# Patient Record
Sex: Male | Born: 1960 | Race: White | Hispanic: No | Marital: Married | Smoking: Current every day smoker
Health system: Southern US, Community
[De-identification: ages and names within clinical notes are randomized; demographics above are authoritative.]

## PROBLEM LIST (undated history)

## (undated) ENCOUNTER — Emergency Department (HOSPITAL_COMMUNITY): Payer: Medicaid Other | Source: Home / Self Care

## (undated) DIAGNOSIS — J449 Chronic obstructive pulmonary disease, unspecified: Secondary | ICD-10-CM

## (undated) DIAGNOSIS — E785 Hyperlipidemia, unspecified: Secondary | ICD-10-CM

## (undated) DIAGNOSIS — K219 Gastro-esophageal reflux disease without esophagitis: Secondary | ICD-10-CM

## (undated) DIAGNOSIS — R519 Headache, unspecified: Secondary | ICD-10-CM

## (undated) DIAGNOSIS — L03115 Cellulitis of right lower limb: Secondary | ICD-10-CM

## (undated) DIAGNOSIS — K759 Inflammatory liver disease, unspecified: Secondary | ICD-10-CM

## (undated) DIAGNOSIS — Z8719 Personal history of other diseases of the digestive system: Secondary | ICD-10-CM

## (undated) DIAGNOSIS — E119 Type 2 diabetes mellitus without complications: Secondary | ICD-10-CM

## (undated) DIAGNOSIS — F329 Major depressive disorder, single episode, unspecified: Secondary | ICD-10-CM

## (undated) DIAGNOSIS — Z201 Contact with and (suspected) exposure to tuberculosis: Secondary | ICD-10-CM

## (undated) DIAGNOSIS — L02415 Cutaneous abscess of right lower limb: Secondary | ICD-10-CM

## (undated) DIAGNOSIS — I509 Heart failure, unspecified: Secondary | ICD-10-CM

## (undated) DIAGNOSIS — G4730 Sleep apnea, unspecified: Secondary | ICD-10-CM

## (undated) DIAGNOSIS — W19XXXA Unspecified fall, initial encounter: Secondary | ICD-10-CM

## (undated) DIAGNOSIS — I6529 Occlusion and stenosis of unspecified carotid artery: Secondary | ICD-10-CM

## (undated) DIAGNOSIS — I639 Cerebral infarction, unspecified: Secondary | ICD-10-CM

## (undated) DIAGNOSIS — M069 Rheumatoid arthritis, unspecified: Secondary | ICD-10-CM

## (undated) DIAGNOSIS — W009XXA Unspecified fall due to ice and snow, initial encounter: Secondary | ICD-10-CM

## (undated) DIAGNOSIS — Z8614 Personal history of Methicillin resistant Staphylococcus aureus infection: Secondary | ICD-10-CM

## (undated) DIAGNOSIS — S129XXA Fracture of neck, unspecified, initial encounter: Secondary | ICD-10-CM

## (undated) DIAGNOSIS — B1920 Unspecified viral hepatitis C without hepatic coma: Secondary | ICD-10-CM

## (undated) DIAGNOSIS — I739 Peripheral vascular disease, unspecified: Secondary | ICD-10-CM

## (undated) DIAGNOSIS — I2699 Other pulmonary embolism without acute cor pulmonale: Secondary | ICD-10-CM

## (undated) DIAGNOSIS — R51 Headache: Secondary | ICD-10-CM

## (undated) DIAGNOSIS — R569 Unspecified convulsions: Secondary | ICD-10-CM

## (undated) DIAGNOSIS — G894 Chronic pain syndrome: Secondary | ICD-10-CM

## (undated) DIAGNOSIS — J45909 Unspecified asthma, uncomplicated: Secondary | ICD-10-CM

## (undated) DIAGNOSIS — N529 Male erectile dysfunction, unspecified: Secondary | ICD-10-CM

## (undated) DIAGNOSIS — J189 Pneumonia, unspecified organism: Secondary | ICD-10-CM

## (undated) DIAGNOSIS — Z87442 Personal history of urinary calculi: Secondary | ICD-10-CM

## (undated) DIAGNOSIS — F32A Depression, unspecified: Secondary | ICD-10-CM

## (undated) DIAGNOSIS — I10 Essential (primary) hypertension: Secondary | ICD-10-CM

## (undated) DIAGNOSIS — F410 Panic disorder [episodic paroxysmal anxiety] without agoraphobia: Secondary | ICD-10-CM

## (undated) HISTORY — DX: Male erectile dysfunction, unspecified: N52.9

## (undated) HISTORY — PX: CARDIAC DEFIBRILLATOR PLACEMENT: SHX171

## (undated) HISTORY — PX: MULTIPLE TOOTH EXTRACTIONS: SHX2053

## (undated) HISTORY — DX: Occlusion and stenosis of unspecified carotid artery: I65.29

## (undated) HISTORY — PX: CERVICAL FUSION: SHX112

## (undated) HISTORY — PX: TONSILLECTOMY: SUR1361

## (undated) HISTORY — PX: FEMORAL-POPLITEAL BYPASS GRAFT: SHX937

## (undated) HISTORY — DX: Cerebral infarction, unspecified: I63.9

## (undated) HISTORY — DX: Peripheral vascular disease, unspecified: I73.9

## (undated) HISTORY — DX: Type 2 diabetes mellitus without complications: E11.9

## (undated) HISTORY — DX: Personal history of Methicillin resistant Staphylococcus aureus infection: Z86.14

## (undated) HISTORY — PX: ORIF TIBIA & FIBULA FRACTURES: SHX2131

## (undated) HISTORY — PX: ABOVE KNEE LEG AMPUTATION: SUR20

## (undated) HISTORY — PX: ABSCESS DRAINAGE: SHX1119

## (undated) HISTORY — DX: Essential (primary) hypertension: I10

## (undated) HISTORY — PX: THROMBOLYSIS: SHX2508

## (undated) HISTORY — DX: Unspecified fall, initial encounter: W19.XXXA

## (undated) HISTORY — DX: Unspecified fall due to ice and snow, initial encounter: W00.9XXA

## (undated) HISTORY — DX: Chronic obstructive pulmonary disease, unspecified: J44.9

## (undated) HISTORY — PX: INTRAOPERATIVE ARTERIOGRAM: SHX5157

## (undated) HISTORY — DX: Hyperlipidemia, unspecified: E78.5

## (undated) HISTORY — DX: Fracture of neck, unspecified, initial encounter: S12.9XXA

## (undated) HISTORY — DX: Chronic pain syndrome: G89.4

## (undated) HISTORY — PX: TRACHEOSTOMY: SUR1362

## (undated) HISTORY — DX: Unspecified viral hepatitis C without hepatic coma: B19.20

## (undated) HISTORY — DX: Heart failure, unspecified: I50.9

## (undated) HISTORY — PX: JOINT REPLACEMENT: SHX530

---

## 1997-05-30 ENCOUNTER — Emergency Department (HOSPITAL_COMMUNITY): Admission: EM | Admit: 1997-05-30 | Discharge: 1997-05-30 | Payer: Self-pay | Admitting: Emergency Medicine

## 1997-06-04 ENCOUNTER — Encounter: Admission: RE | Admit: 1997-06-04 | Discharge: 1997-06-04 | Payer: Self-pay | Admitting: Internal Medicine

## 1997-06-06 ENCOUNTER — Ambulatory Visit (HOSPITAL_COMMUNITY): Admission: RE | Admit: 1997-06-06 | Discharge: 1997-06-06 | Payer: Self-pay | Admitting: *Deleted

## 1997-06-18 ENCOUNTER — Encounter: Admission: RE | Admit: 1997-06-18 | Discharge: 1997-06-18 | Payer: Self-pay | Admitting: Internal Medicine

## 1997-06-27 ENCOUNTER — Ambulatory Visit (HOSPITAL_COMMUNITY): Admission: RE | Admit: 1997-06-27 | Discharge: 1997-06-27 | Payer: Self-pay | Admitting: *Deleted

## 1997-06-29 ENCOUNTER — Ambulatory Visit: Admission: RE | Admit: 1997-06-29 | Discharge: 1997-06-29 | Payer: Self-pay | Admitting: Vascular Surgery

## 1997-07-03 ENCOUNTER — Inpatient Hospital Stay: Admission: RE | Admit: 1997-07-03 | Discharge: 1997-07-05 | Payer: Self-pay | Admitting: Vascular Surgery

## 1997-08-03 ENCOUNTER — Encounter: Admission: RE | Admit: 1997-08-03 | Discharge: 1997-08-03 | Payer: Self-pay | Admitting: Internal Medicine

## 1998-01-10 ENCOUNTER — Encounter: Payer: Self-pay | Admitting: Emergency Medicine

## 1998-01-10 ENCOUNTER — Emergency Department (HOSPITAL_COMMUNITY): Admission: EM | Admit: 1998-01-10 | Discharge: 1998-01-10 | Payer: Self-pay | Admitting: Emergency Medicine

## 1998-04-04 ENCOUNTER — Emergency Department (HOSPITAL_COMMUNITY): Admission: EM | Admit: 1998-04-04 | Discharge: 1998-04-04 | Payer: Self-pay | Admitting: Emergency Medicine

## 1998-10-09 ENCOUNTER — Encounter: Admission: RE | Admit: 1998-10-09 | Discharge: 1998-10-09 | Payer: Self-pay | Admitting: Internal Medicine

## 1999-01-19 ENCOUNTER — Emergency Department (HOSPITAL_COMMUNITY): Admission: EM | Admit: 1999-01-19 | Discharge: 1999-01-19 | Payer: Self-pay | Admitting: *Deleted

## 1999-01-19 ENCOUNTER — Encounter: Payer: Self-pay | Admitting: *Deleted

## 1999-03-11 ENCOUNTER — Encounter: Admission: RE | Admit: 1999-03-11 | Discharge: 1999-03-11 | Payer: Self-pay | Admitting: Hematology and Oncology

## 1999-07-11 ENCOUNTER — Emergency Department (HOSPITAL_COMMUNITY): Admission: EM | Admit: 1999-07-11 | Discharge: 1999-07-11 | Payer: Self-pay | Admitting: Emergency Medicine

## 2000-10-01 ENCOUNTER — Encounter: Admission: RE | Admit: 2000-10-01 | Discharge: 2000-10-01 | Payer: Self-pay | Admitting: Internal Medicine

## 2000-10-07 ENCOUNTER — Encounter: Admission: RE | Admit: 2000-10-07 | Discharge: 2000-10-07 | Payer: Self-pay | Admitting: Internal Medicine

## 2001-07-18 ENCOUNTER — Emergency Department (HOSPITAL_COMMUNITY): Admission: EM | Admit: 2001-07-18 | Discharge: 2001-07-18 | Payer: Self-pay | Admitting: Emergency Medicine

## 2001-07-18 ENCOUNTER — Encounter: Payer: Self-pay | Admitting: Emergency Medicine

## 2002-02-08 ENCOUNTER — Encounter: Admission: RE | Admit: 2002-02-08 | Discharge: 2002-02-08 | Payer: Self-pay | Admitting: Internal Medicine

## 2002-03-28 ENCOUNTER — Encounter: Admission: RE | Admit: 2002-03-28 | Discharge: 2002-03-28 | Payer: Self-pay | Admitting: Internal Medicine

## 2002-04-11 ENCOUNTER — Encounter: Admission: RE | Admit: 2002-04-11 | Discharge: 2002-04-11 | Payer: Self-pay | Admitting: Internal Medicine

## 2002-05-24 ENCOUNTER — Encounter: Admission: RE | Admit: 2002-05-24 | Discharge: 2002-05-24 | Payer: Self-pay | Admitting: Internal Medicine

## 2002-05-26 ENCOUNTER — Encounter: Payer: Self-pay | Admitting: Internal Medicine

## 2002-05-26 ENCOUNTER — Encounter: Admission: RE | Admit: 2002-05-26 | Discharge: 2002-05-26 | Payer: Self-pay | Admitting: Internal Medicine

## 2002-05-26 ENCOUNTER — Ambulatory Visit (HOSPITAL_COMMUNITY): Admission: RE | Admit: 2002-05-26 | Discharge: 2002-05-26 | Payer: Self-pay | Admitting: Internal Medicine

## 2002-07-08 ENCOUNTER — Emergency Department (HOSPITAL_COMMUNITY): Admission: EM | Admit: 2002-07-08 | Discharge: 2002-07-08 | Payer: Self-pay | Admitting: Emergency Medicine

## 2002-11-09 ENCOUNTER — Encounter: Admission: RE | Admit: 2002-11-09 | Discharge: 2002-11-09 | Payer: Self-pay | Admitting: Internal Medicine

## 2002-12-02 ENCOUNTER — Emergency Department (HOSPITAL_COMMUNITY): Admission: EM | Admit: 2002-12-02 | Discharge: 2002-12-03 | Payer: Self-pay | Admitting: Emergency Medicine

## 2003-01-13 ENCOUNTER — Emergency Department (HOSPITAL_COMMUNITY): Admission: EM | Admit: 2003-01-13 | Discharge: 2003-01-14 | Payer: Self-pay | Admitting: Emergency Medicine

## 2003-09-20 ENCOUNTER — Ambulatory Visit: Payer: Self-pay | Admitting: Internal Medicine

## 2003-09-20 ENCOUNTER — Ambulatory Visit (HOSPITAL_COMMUNITY): Admission: RE | Admit: 2003-09-20 | Discharge: 2003-09-20 | Payer: Self-pay | Admitting: Internal Medicine

## 2003-09-25 ENCOUNTER — Ambulatory Visit: Payer: Self-pay | Admitting: Internal Medicine

## 2003-10-02 ENCOUNTER — Ambulatory Visit (HOSPITAL_COMMUNITY): Admission: RE | Admit: 2003-10-02 | Discharge: 2003-10-02 | Payer: Self-pay | Admitting: Internal Medicine

## 2003-10-05 ENCOUNTER — Ambulatory Visit: Payer: Self-pay | Admitting: Internal Medicine

## 2003-12-25 ENCOUNTER — Ambulatory Visit: Payer: Self-pay | Admitting: Internal Medicine

## 2004-02-13 ENCOUNTER — Emergency Department (HOSPITAL_COMMUNITY): Admission: EM | Admit: 2004-02-13 | Discharge: 2004-02-13 | Payer: Self-pay | Admitting: Family Medicine

## 2004-03-25 ENCOUNTER — Ambulatory Visit: Payer: Self-pay | Admitting: Internal Medicine

## 2004-04-03 ENCOUNTER — Ambulatory Visit: Payer: Self-pay | Admitting: Internal Medicine

## 2004-05-13 ENCOUNTER — Ambulatory Visit: Payer: Self-pay | Admitting: Internal Medicine

## 2004-05-13 ENCOUNTER — Ambulatory Visit (HOSPITAL_COMMUNITY): Admission: RE | Admit: 2004-05-13 | Discharge: 2004-05-13 | Payer: Self-pay | Admitting: Internal Medicine

## 2004-05-18 ENCOUNTER — Emergency Department (HOSPITAL_COMMUNITY): Admission: EM | Admit: 2004-05-18 | Discharge: 2004-05-18 | Payer: Self-pay | Admitting: Emergency Medicine

## 2004-05-22 ENCOUNTER — Ambulatory Visit: Payer: Self-pay | Admitting: Internal Medicine

## 2004-06-05 ENCOUNTER — Inpatient Hospital Stay (HOSPITAL_COMMUNITY): Admission: EM | Admit: 2004-06-05 | Discharge: 2004-07-01 | Payer: Self-pay
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
## 2006-01-13 IMAGING — CR DG CHEST 2V
3 series · 3 of 3 positions shown · non-contrast
Comparison: none

CLINICAL DATA: Cough.  Shortness of breath.  The patient was told 6 months ago that he has a spot on his lung.
 TWO-VIEW CHEST RADIOGRAPH, 09/20/03 
 Comparing to report from a prior chest radiograph dated 07/18/01.

[view not recorded (1 of 3)]
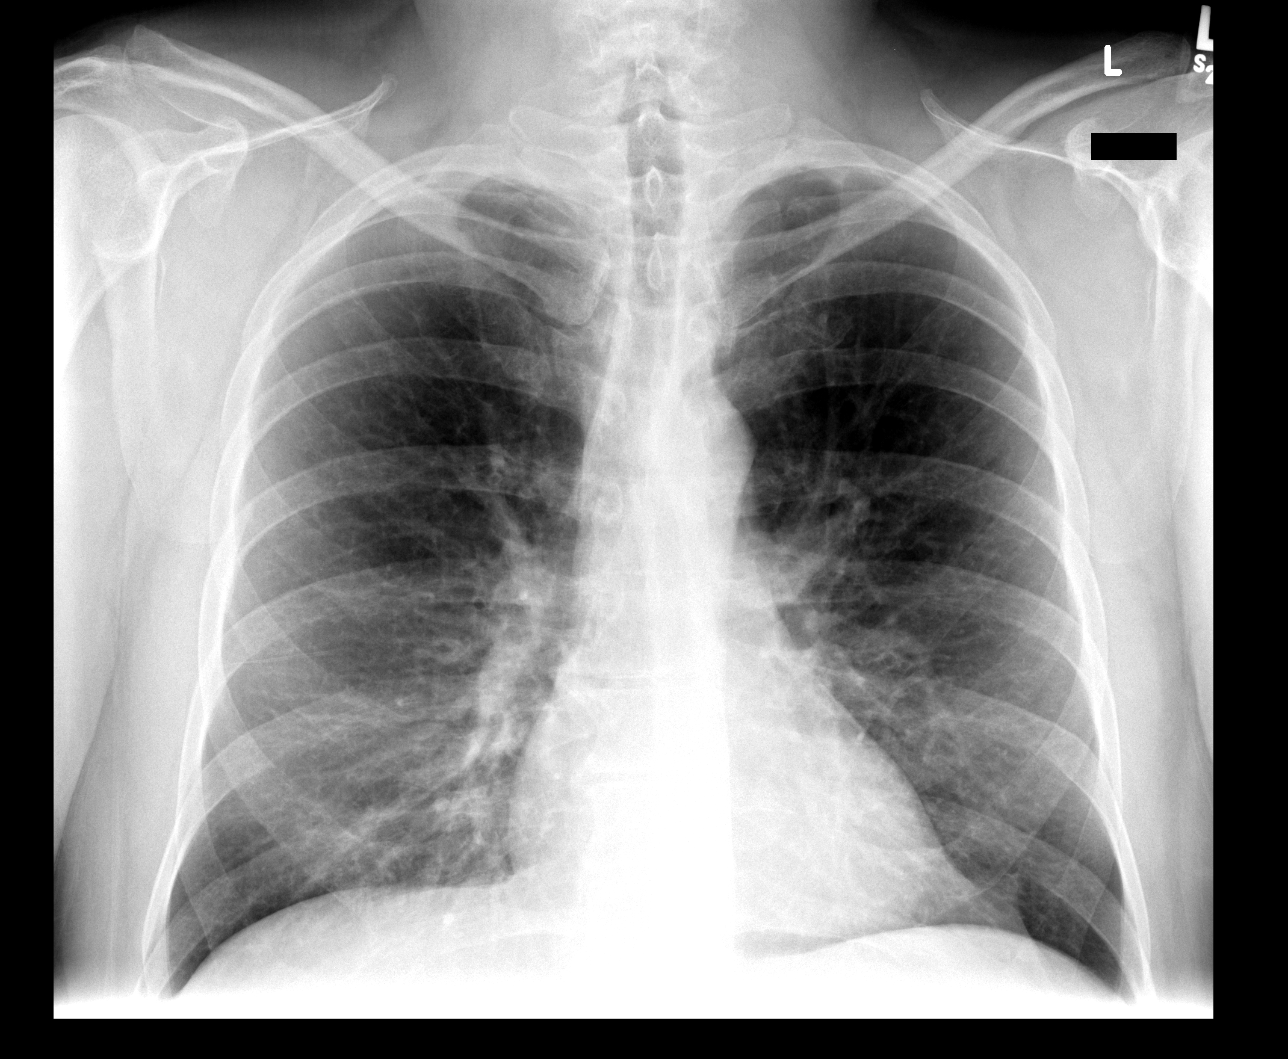

[view not recorded (2 of 3)]
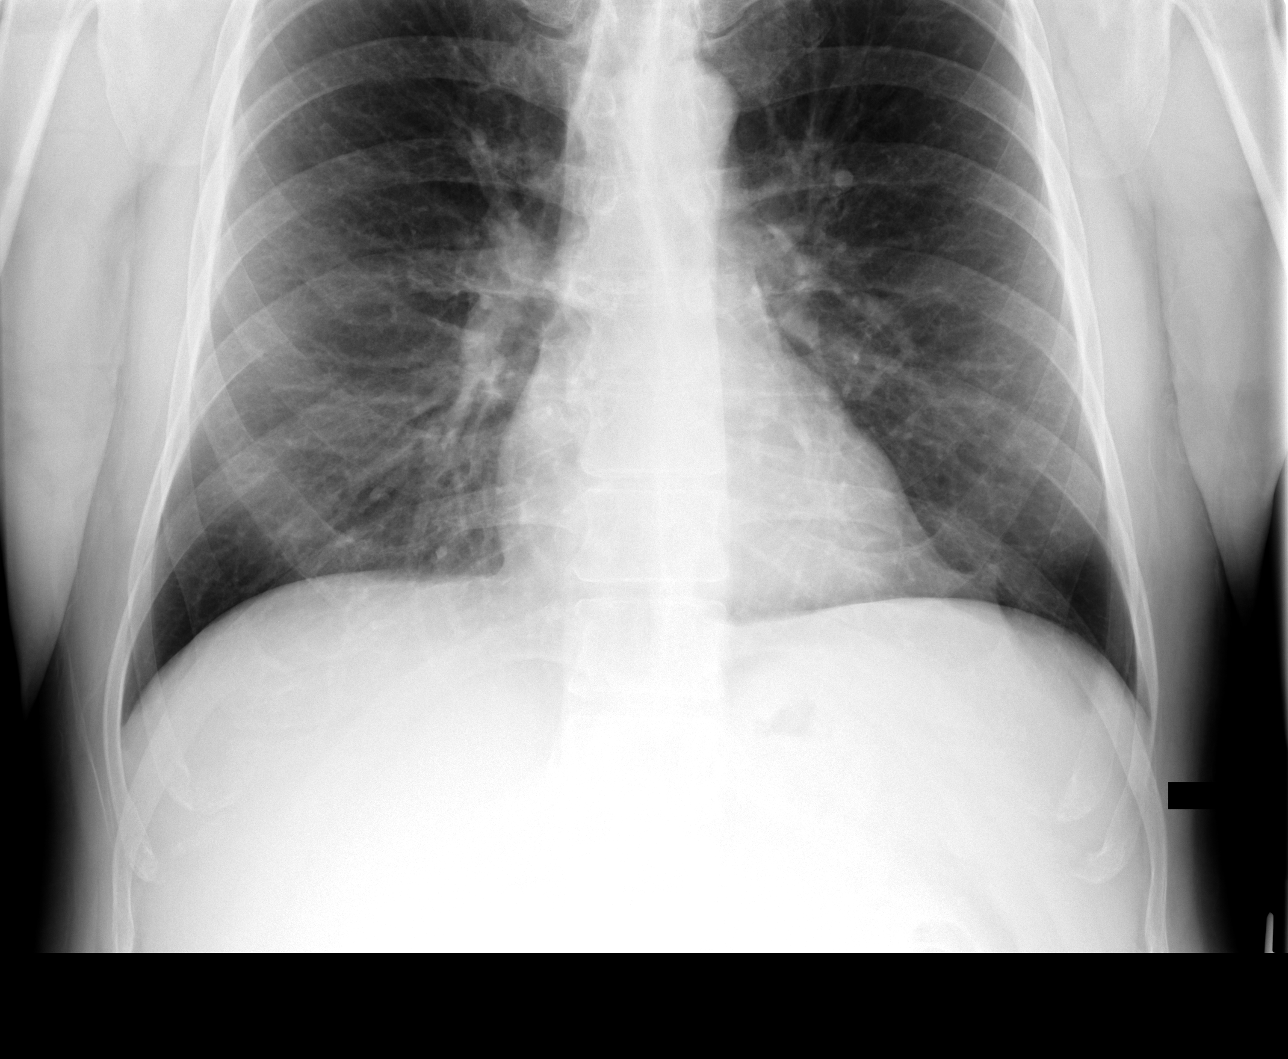

[view not recorded (3 of 3)]
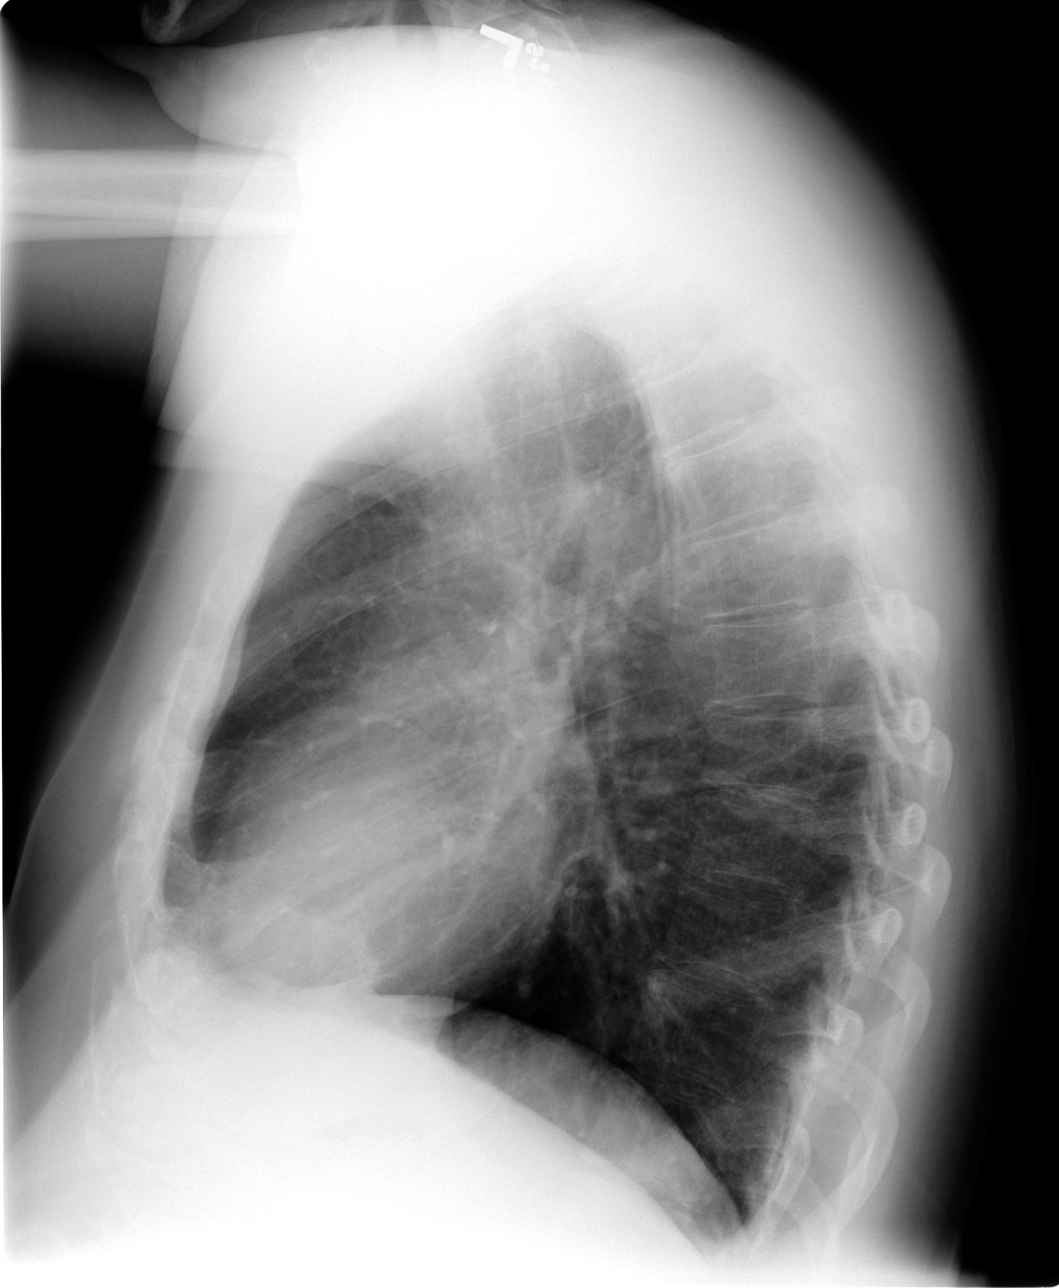

[3 of 3 positions shown; findings below may reference images not displayed]

FINDINGS: Heart and mediastinum appear unremarkable.  There is some mild parenchymal scarring at the left lung apex.  There is also a tented appearance of the margin of the pericardial fat near the cardiac apex, such that a nodule at this location is not excluded.  There is some mild airway thickening.
 IMPRESSION 
 Slight left apical scarring.
 Mild airway thickening, potentially a manifestation of bronchitis or reactive airways.

## 2006-01-25 IMAGING — CT CT CHEST W/ CM
3 of 4 series · 15 of 32 positions shown, 19 images · IV contrast (gastro & 100ml [ID])
Comparison: None.

CT CHEST

CLINICAL DATA: Follow up lung nodule seen on chest x-ray. Also with right-sided mid abdominal pain
and hepatitis C.

ABDOMEN AND PELVIS WITH CONTRAST
TECHNIQUE: Multidetector CT imaging of the chest, abdomen, and pelvis was performed with routine
protocol during IV bolus administration of intravenous contrast. 
Contrast:  100 cc Omnipaque 300.

[Series 2: chest/abd/pelvis · axial · 0.70mm/px · z∈[-610,-103]mm · 8 of 150 slices shown]
[im 16/150  soft-tissue]
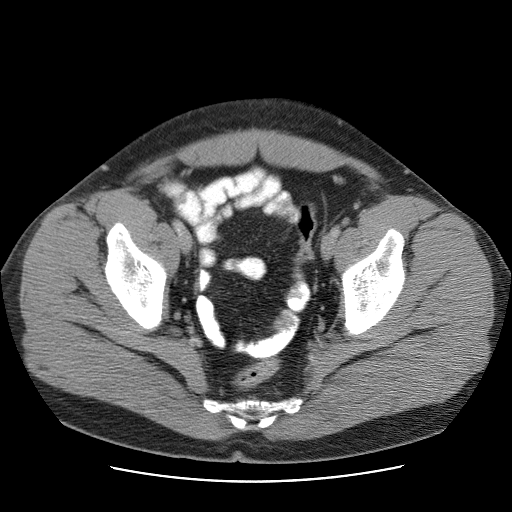
[im 32/150  soft-tissue]
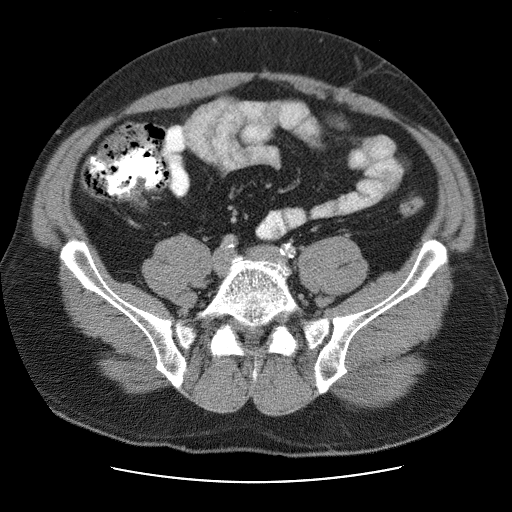
[im 48/150  soft-tissue]
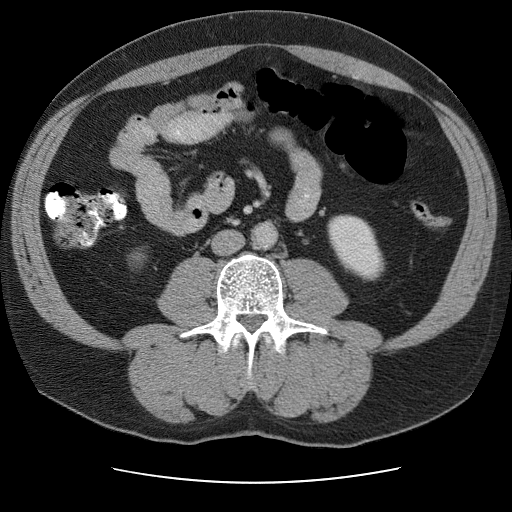
[im 63/150  soft-tissue]
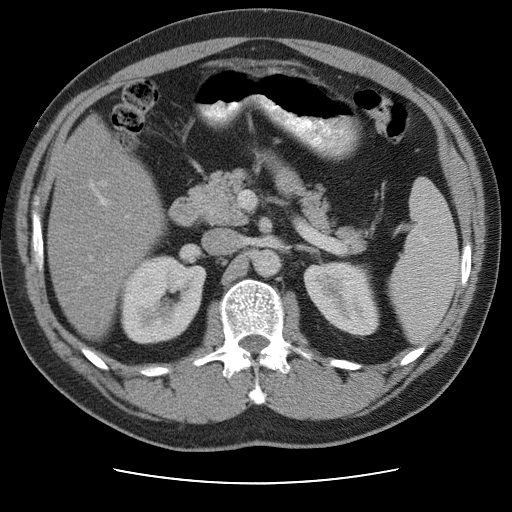
[im 87/150  soft-tissue]
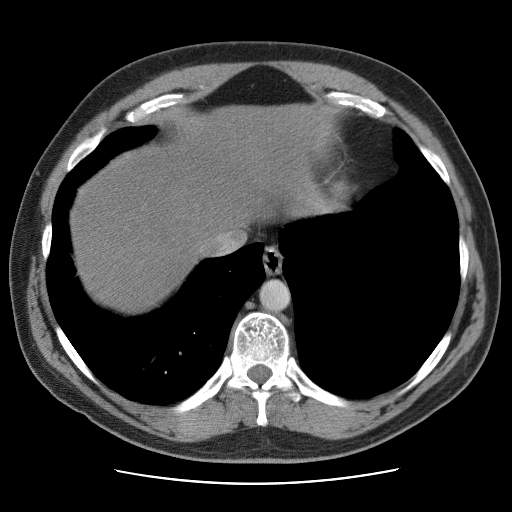
[im 102/150  soft-tissue]
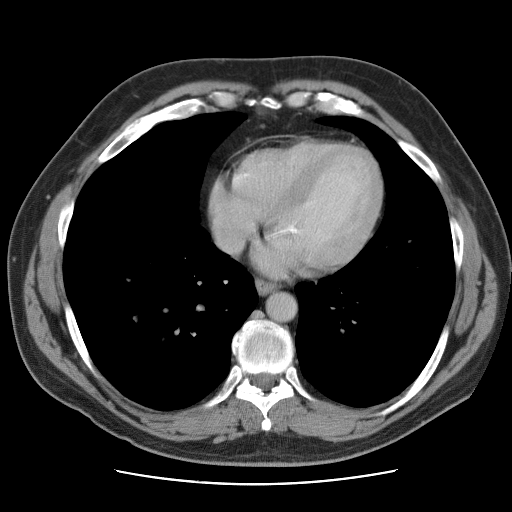
[im 118/150  soft-tissue]
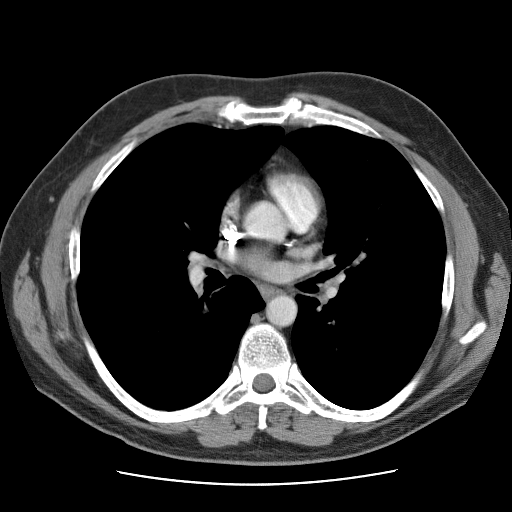
[im 134/150  soft-tissue]
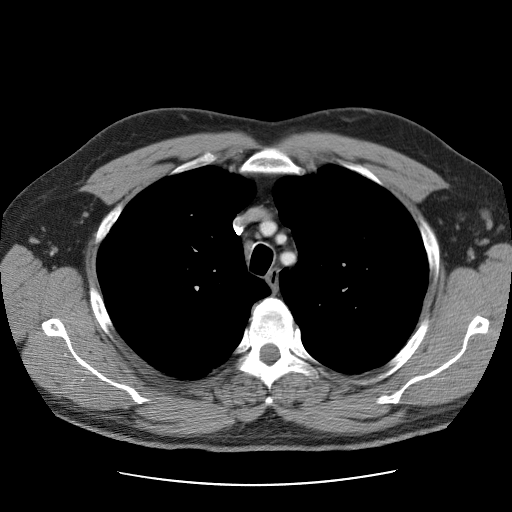

[Series 3: recon 2: chest/abd/pelvis · axial · 0.70mm/px · z∈[-243,-113]mm · 4 of 53 slices shown]
[im 9/53  soft-tissue]
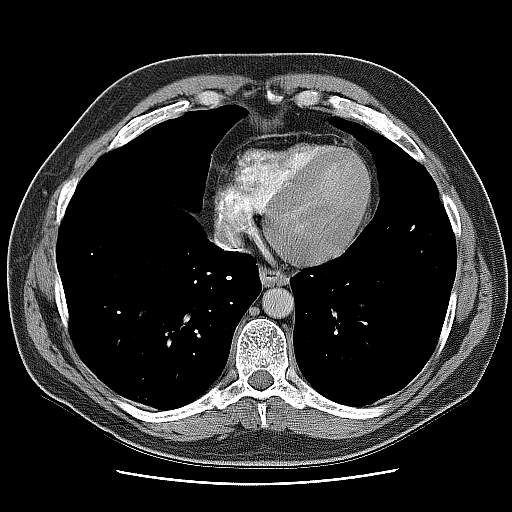
[im 18/53  soft-tissue]
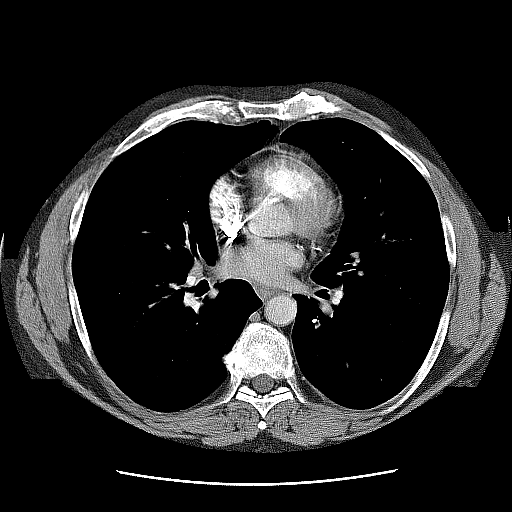
[im 27/53  soft-tissue]
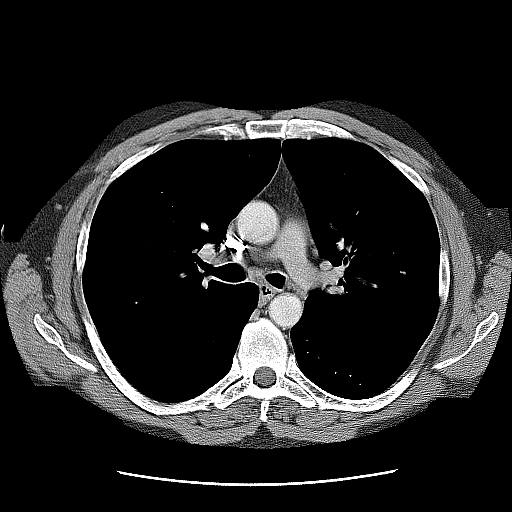
[im 35/53  soft-tissue]
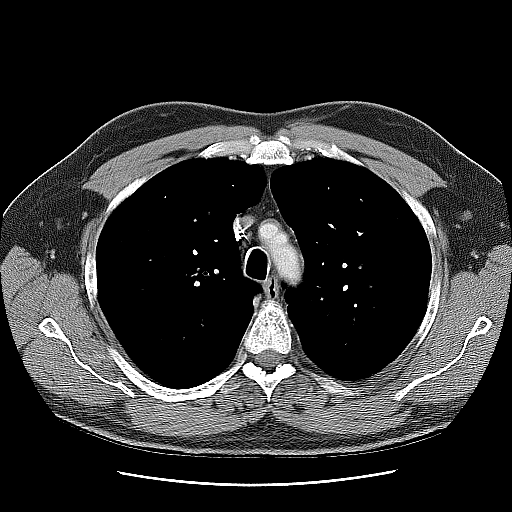

[Series 4: renal delays · axial · 0.70mm/px · z∈[-450,-325]mm · 3 of 25 slices shown, 7 images]
[im 1/25  soft-tissue]
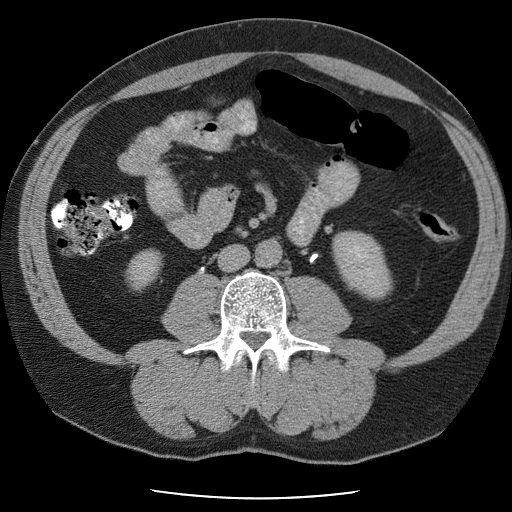
[im 1/25  lung]
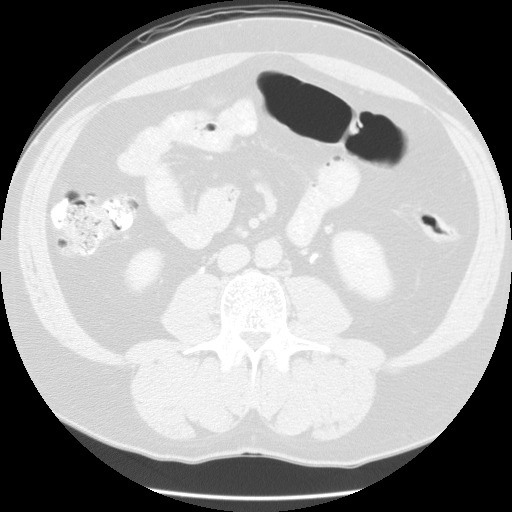
[im 1/25  bone]
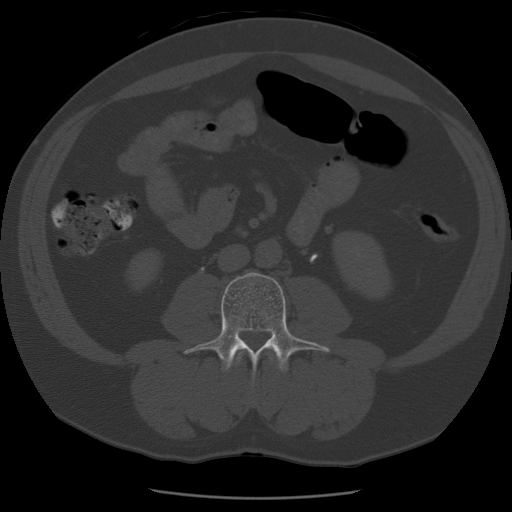
[im 13/25  soft-tissue]
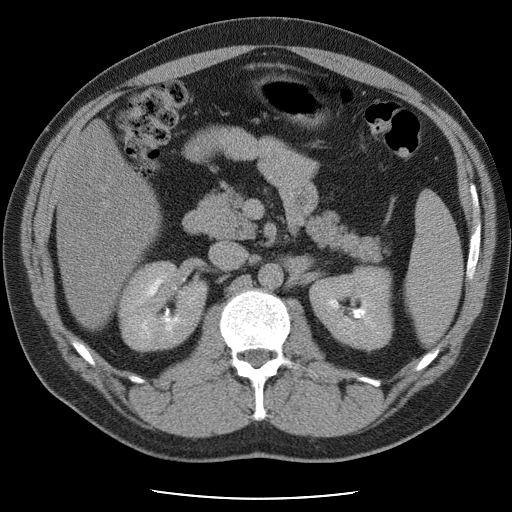
[im 13/25  lung]
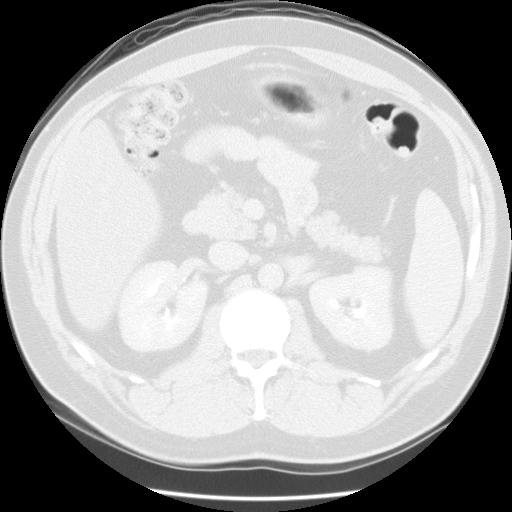
[im 25/25  soft-tissue]
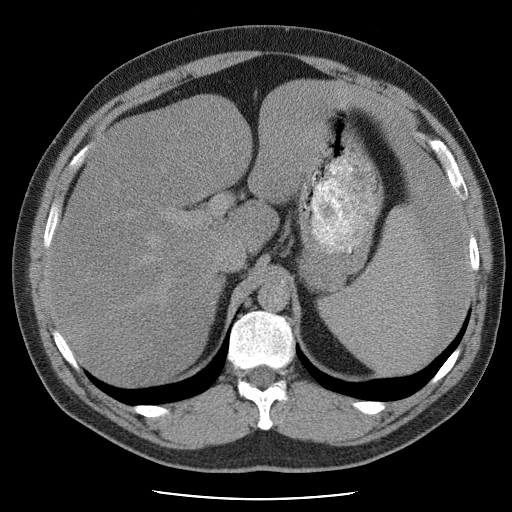
[im 25/25  lung]
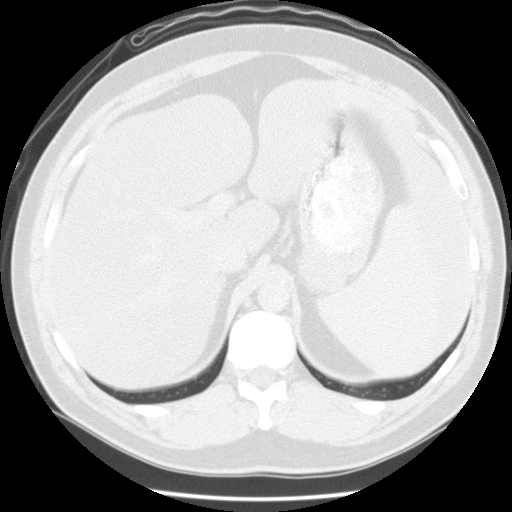

[15 of 32 positions shown; findings below may reference images not displayed]

FINDINGS: There is no nodule seen along the left cardiophrenic border. There are linear densities
in this region, most compatible with scarring.   Minimal dependent atelectasis noted in the right
lung. No focal nodules, masses, or effusions.  No mediastinal, hilar, or axillary adenopathy.
IMPRESSION: There is no nodule seen along the left cardiophrenic border. This appears to represent a subtle
area of scarring.
No active disease in the chest. 

CT ABDOMEN

Fatty infiltration of the liver.  The left lobe of the liver wraps around the spleen. No focal
liver lesions. Spleen, pancreas, adrenals, and kidneys are unremarkable. A moderate amount of stool
is seen throughout the colon.   No free fluid, free air, or adenopathy.
IMPRESSION: Fatty infiltration of the liver. No acute findings. 

CT PELVIS

Atherosclerotic calcification is noted in the distal abdominal aorta and iliac vessels. No free
fluid, free air, or adenopathy. Bowel grossly unremarkable.
IMPRESSION: No acute findings in the pelvis.

## 2006-06-02 ENCOUNTER — Encounter: Payer: Self-pay | Admitting: Vascular Surgery

## 2006-06-02 ENCOUNTER — Inpatient Hospital Stay (HOSPITAL_COMMUNITY): Admission: EM | Admit: 2006-06-02 | Discharge: 2006-06-06 | Payer: Self-pay | Admitting: Emergency Medicine

## 2006-06-02 ENCOUNTER — Ambulatory Visit: Payer: Self-pay | Admitting: Vascular Surgery

## 2006-06-07 ENCOUNTER — Ambulatory Visit (HOSPITAL_COMMUNITY): Admission: RE | Admit: 2006-06-07 | Discharge: 2006-06-07 | Payer: Self-pay | Admitting: Vascular Surgery

## 2006-06-21 ENCOUNTER — Ambulatory Visit: Payer: Self-pay | Admitting: Vascular Surgery

## 2006-08-14 ENCOUNTER — Emergency Department (HOSPITAL_COMMUNITY): Admission: EM | Admit: 2006-08-14 | Discharge: 2006-08-14 | Payer: Self-pay | Admitting: Emergency Medicine
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
## 2006-10-05 IMAGING — CR DG CHEST 1V PORT
1 series · 1 of 1 positions shown · non-contrast
Comparison: none

CLINICAL DATA: Rib fractures. 
 PORTABLE CHEST ([DATE] HOURS):

[view not recorded]
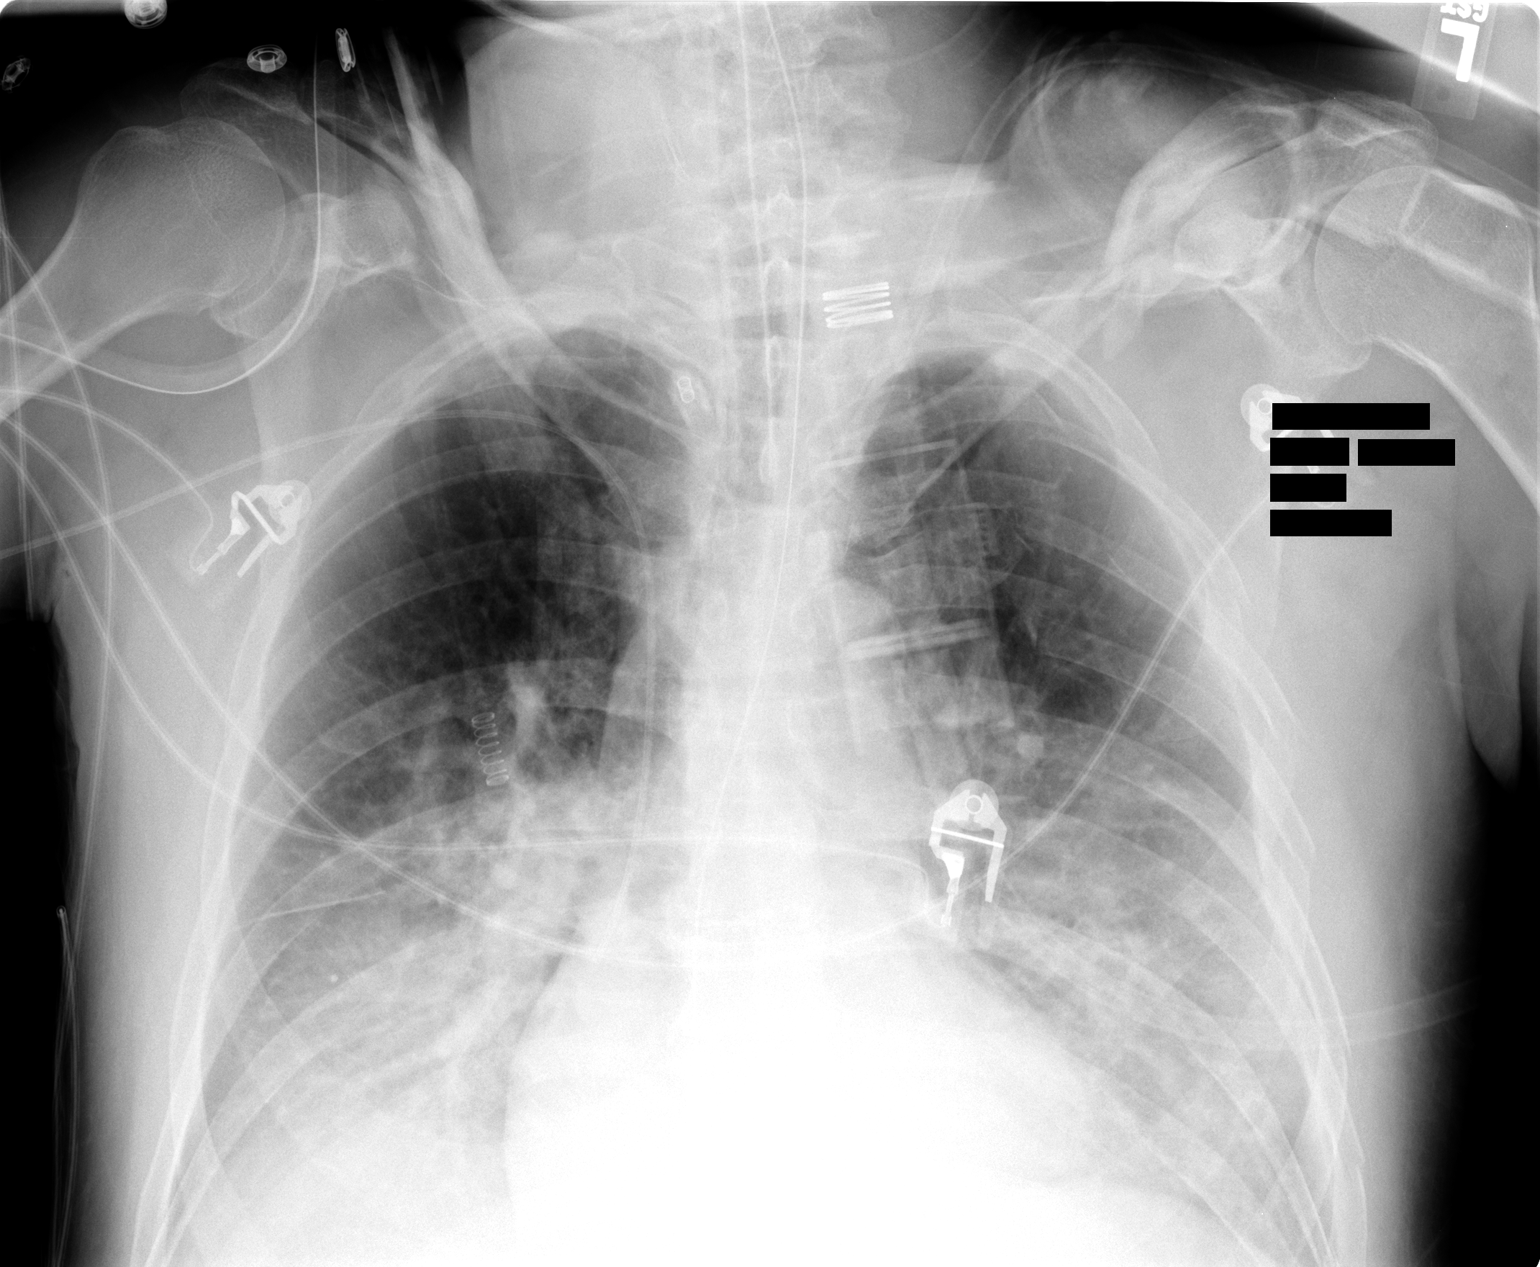

[1 of 1 positions shown; findings below may reference images not displayed]

FINDINGS: The endotracheal tube and right upper extremity PICC are stable.  An NG tube has been placed.  The tip is beyond the GE junction.  Bilateral airspace disease is stable.  No pneumothoraces are seen.  Rib fractures are again noted.
IMPRESSION: Other than NG tube placement there is no significant interval change.

## 2006-10-06 IMAGING — CR DG CHEST 1V PORT
1 series · 1 of 1 positions shown · non-contrast
Comparison: 06/11/04.

CLINICAL DATA: Rib fractures.  C1 fracture.  Endotracheal tube.
 CHEST PORTABLE - 1 VIEW - 3631 HOURS:

[view not recorded]
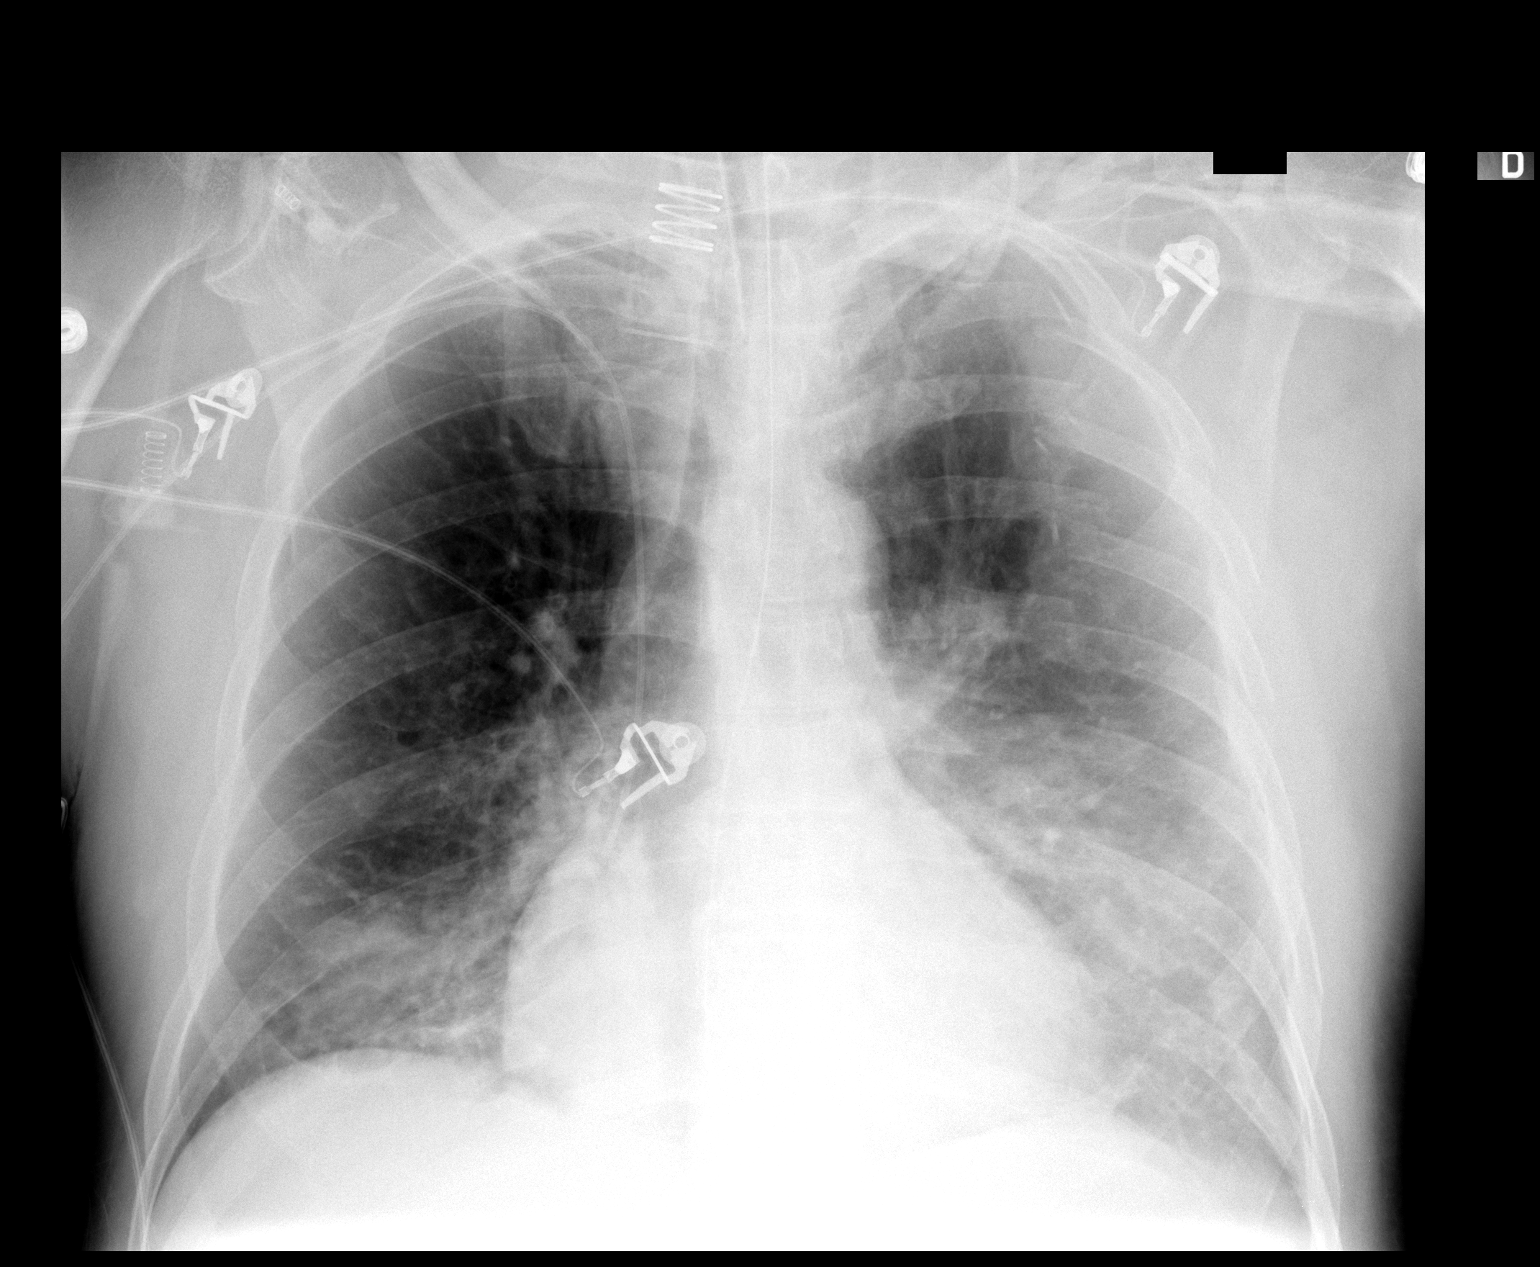

[1 of 1 positions shown; findings below may reference images not displayed]

ETT is in the proximal to mid trachea at or just above the thoracic inlet.  NG tube is noted traversing the esophagus and entering the stomach.  PICC line via right upper extremity vein approach is noted with tip near the SVC/right atrial junction.  Air space disease bilaterally again noted with some improvement in aeration.  Multiple displaced left rib fractures.  No pneumothorax.
IMPRESSION: No pneumothorax.  Slightly improved aeration.  Vascular congestion, slightly improved.

## 2006-10-07 IMAGING — CR DG CHEST 1V PORT
1 series · 1 of 1 positions shown · non-contrast
Comparison: 06/12/04.

CLINICAL DATA: Rib fractures.
 PORTABLE CHEST, 06/13/04, [DATE] HOURS:

[view not recorded]
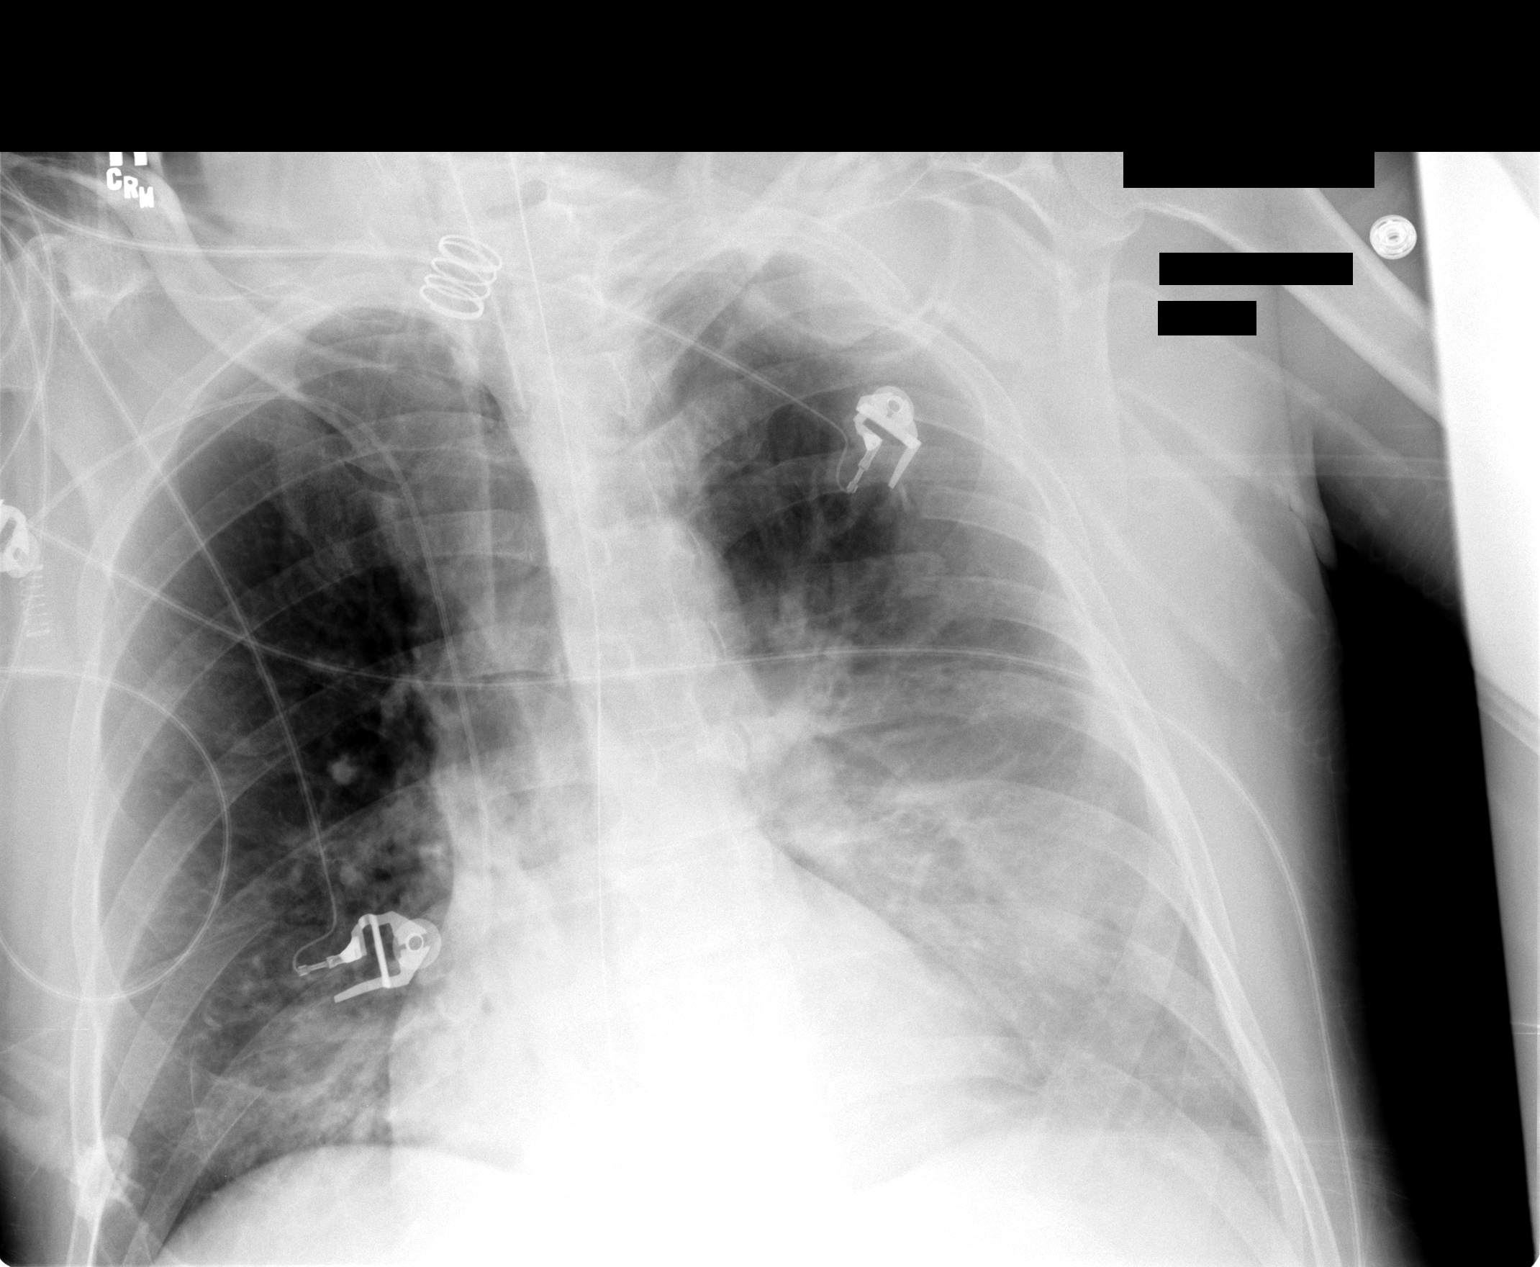

[1 of 1 positions shown; findings below may reference images not displayed]

The endotracheal tube, NG tube, and right upper extremity PICC are stable.  No pneumothoraces are seen.  Bilateral airspace disease with sparring of the right upper lobe is stable.  The heart is upper normal in size.
IMPRESSION: No significant interval change noted.
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
## 2006-10-12 IMAGING — CR DG CHEST 1V PORT
1 series · 1 of 1 positions shown · non-contrast
Comparison: none

CLINICAL DATA: Rib fractures.
PORTABLE SEMIERECT CHEST ([DATE] HOURS):
ET tube removed.  Bibasilar atelectasis persists.  Slight clearing left effusion.  Rib fractures unchanged.  PICC line tip right atrium.  No pneumothorax.

[view not recorded]
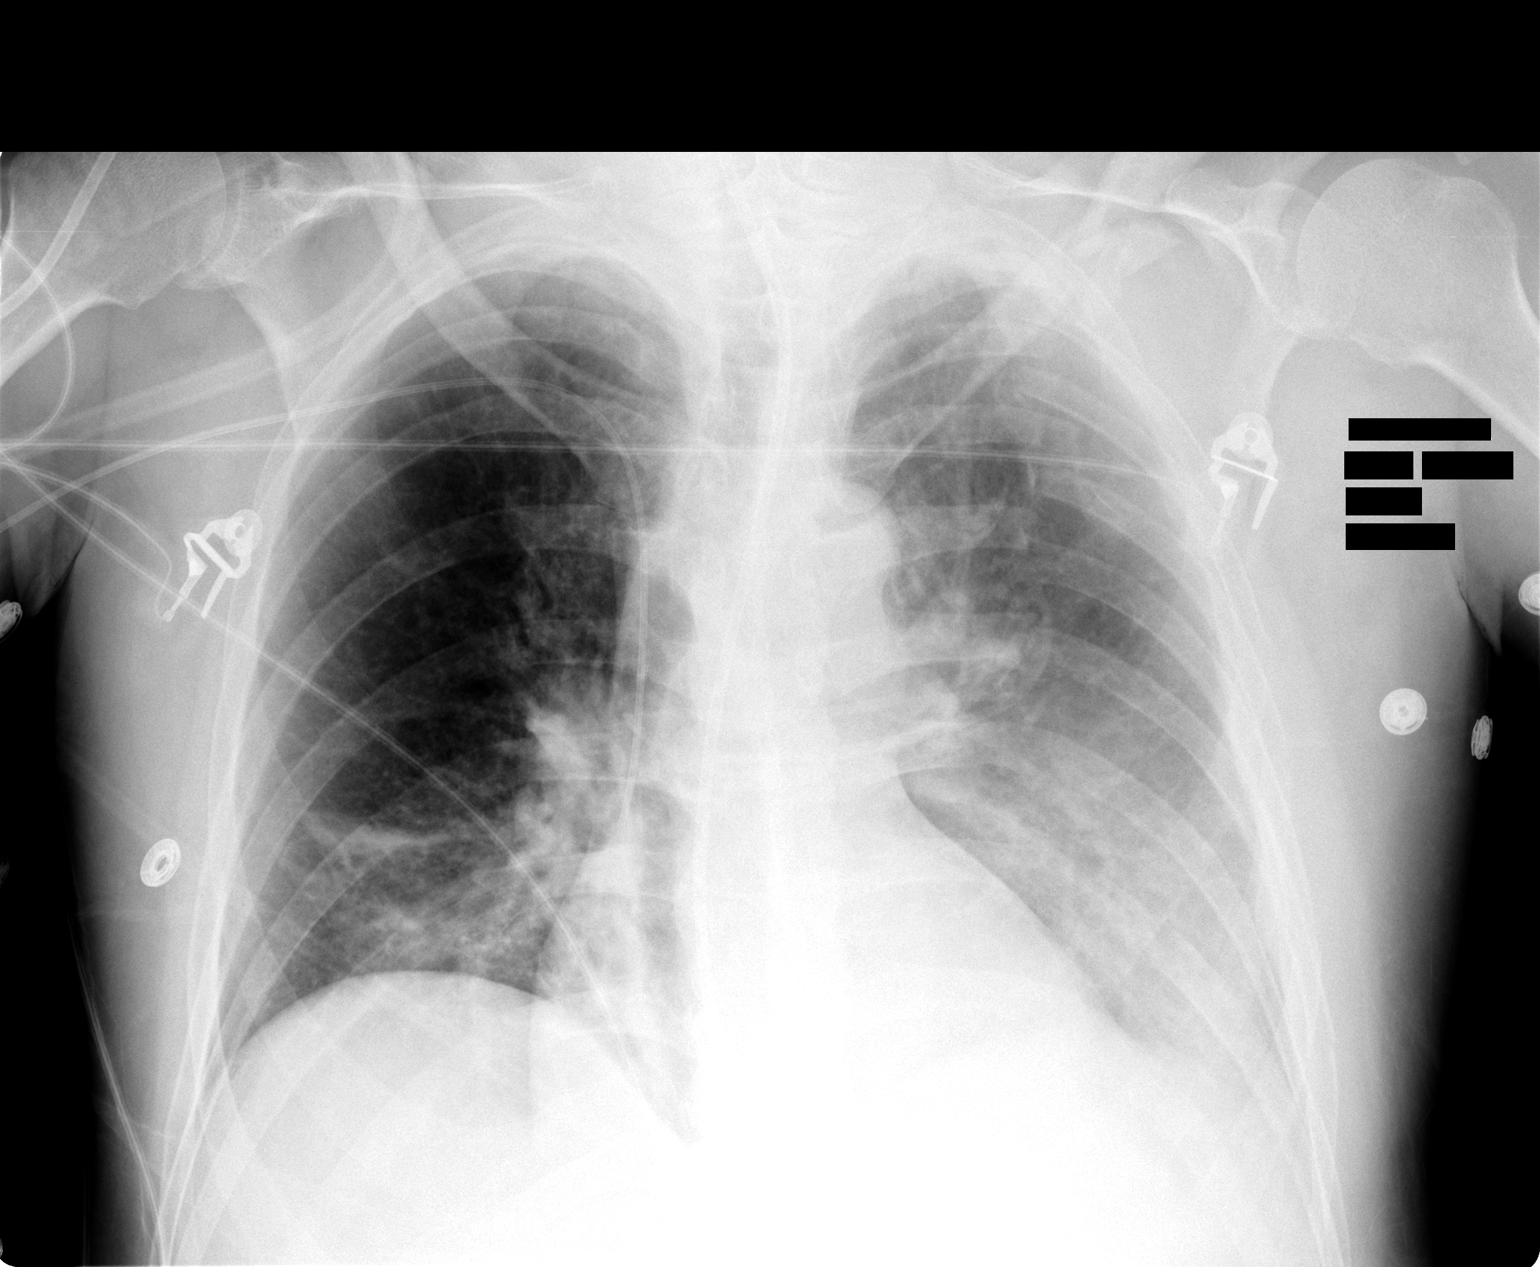

[1 of 1 positions shown; findings below may reference images not displayed]

IMPRESSION: Slight clearing left effusion.
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
## 2006-10-17 IMAGING — CR DG ABD PORTABLE 1V
1 series · 1 of 1 positions shown · non-contrast
Comparison: Portable abdomen x-ray earlier this morning at 1891 hours.

CLINICAL DATA: Feeding tube repositioning.

PORTABLE ABDOMEN - 1 VIEW  [DATE]/5118 6151 hours:

[view not recorded]
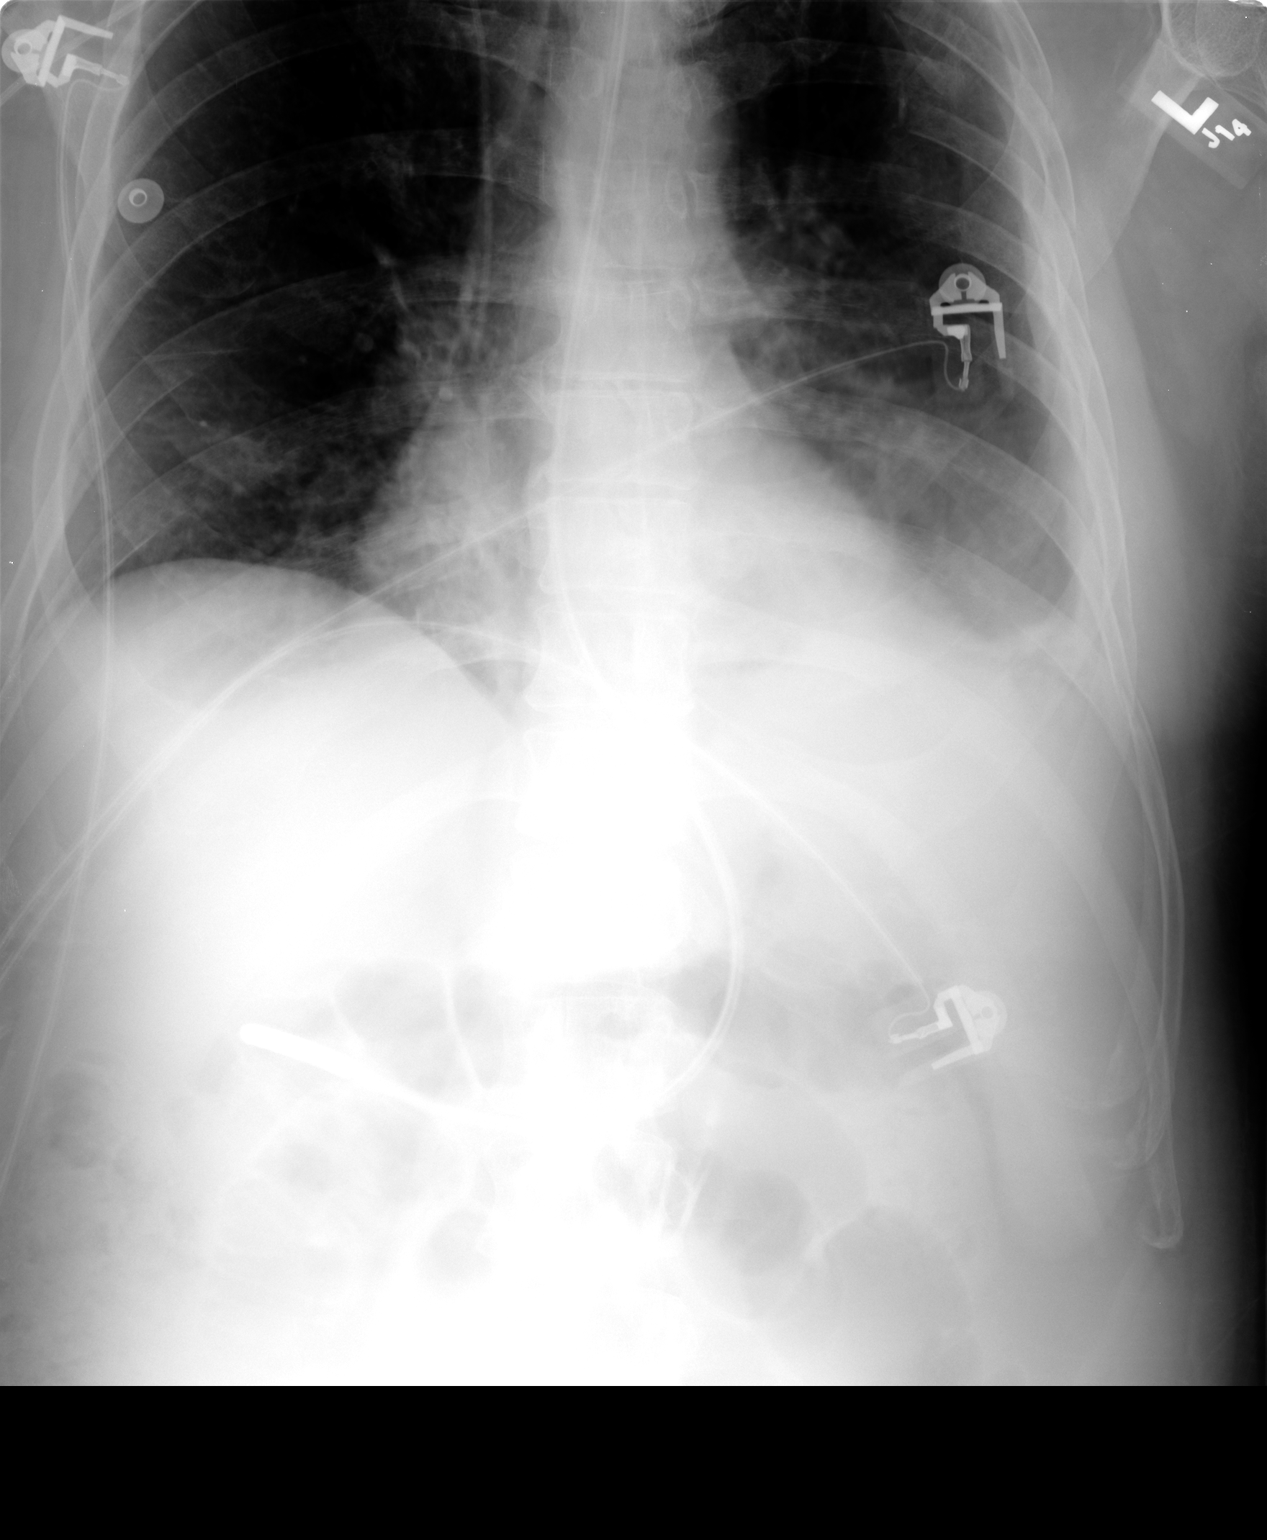

[1 of 1 positions shown; findings below may reference images not displayed]

FINDINGS: The feeding tube has been repositioned such that its tip is now in
the region of the gastric antrum or duodenal bulb. It is no longer looped in the
stomach. The visualized bowel gas pattern is unremarkable.
IMPRESSION: Feeding tube tip now at the gastric antrum or duodenal bulb.
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
## 2006-10-17 IMAGING — CR DG CHEST 1V PORT
1 series · 1 of 1 positions shown · non-contrast
Comparison: Portable chest x-ray 06/21/2004.

CLINICAL DATA: Followup multiple left rib fractures and the bibasilar
atelectasis.

PORTABLE CHEST - 1 VIEW  [DATE]/0229 2522 hours:

[view not recorded]
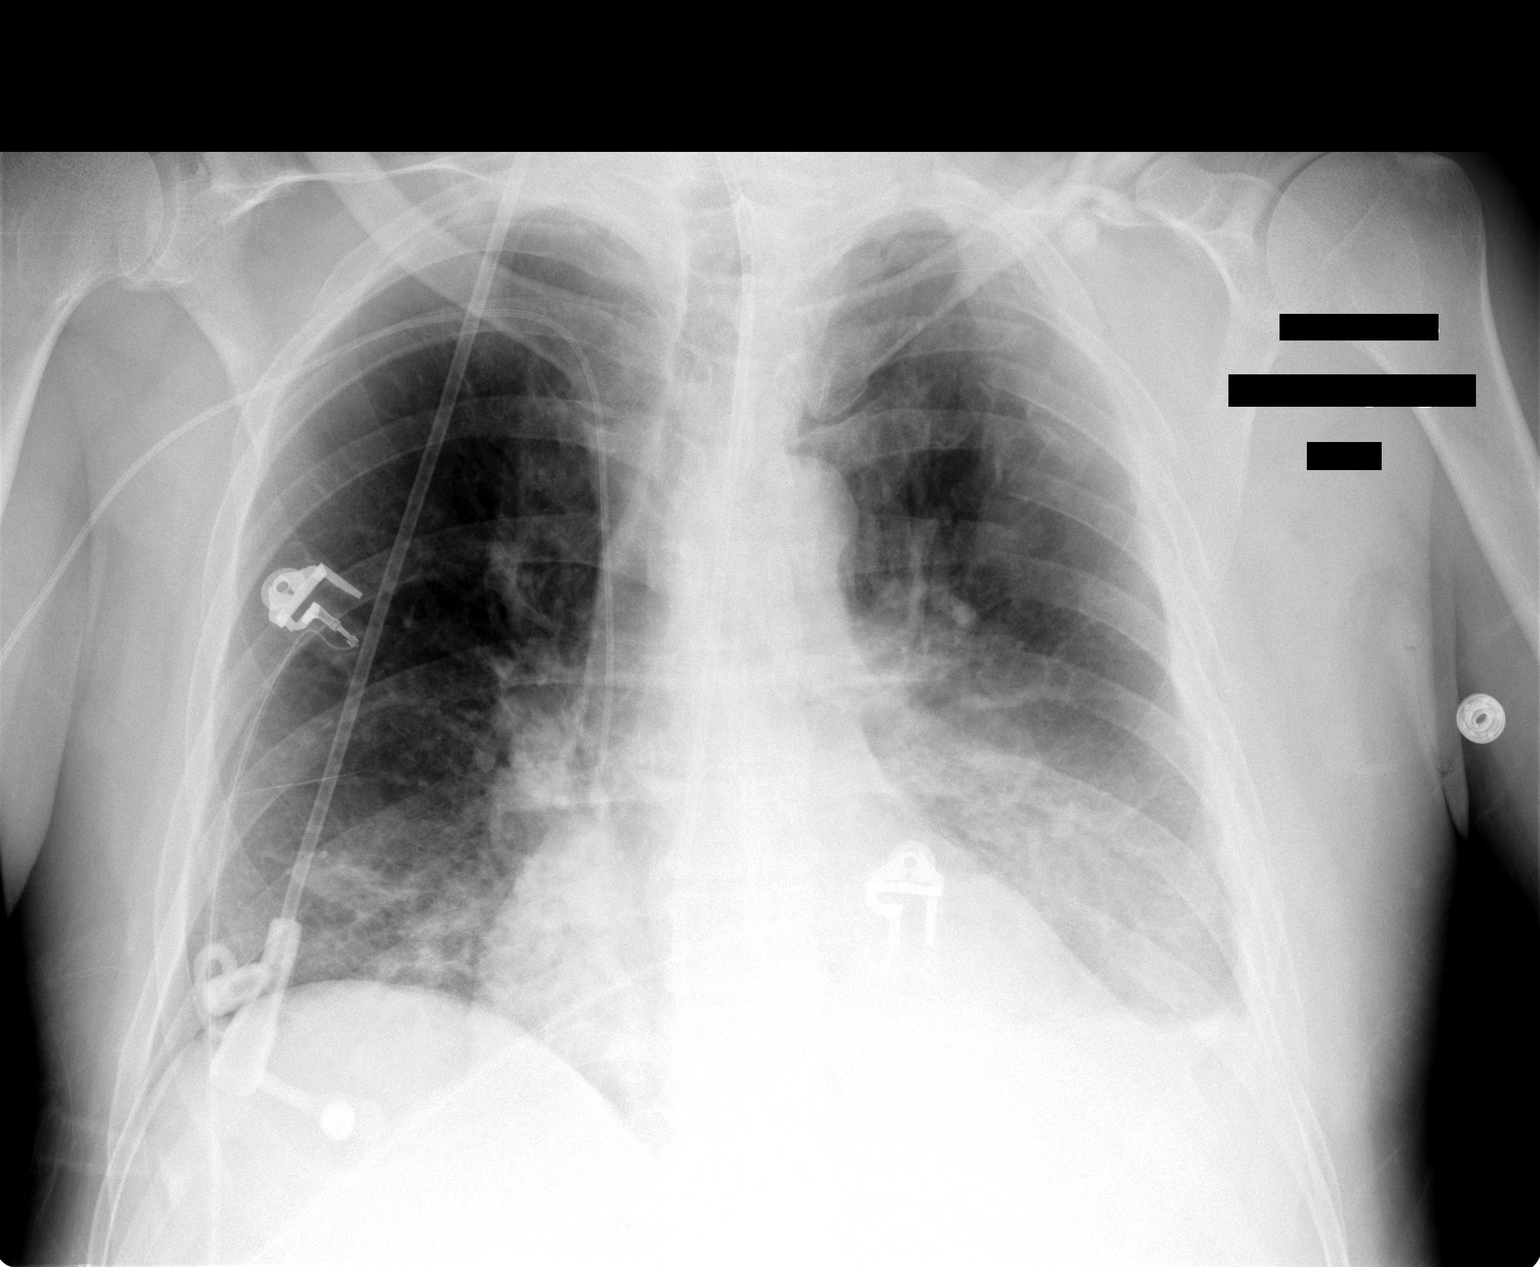

[1 of 1 positions shown; findings below may reference images not displayed]

FINDINGS: Multiple left rib fractures are again noted. The small left pleural
effusion/hemothorax and atelectasis in the left base are stable. Mild
atelectasis in the right base has improved minimally. No new abnormality
detected. Right arm PICC tip remains in the SVC. Feeding tube courses below the
diaphragm.
IMPRESSION: 1. Stable left pleural effusion/hemothorax and left basilar atelectasis.
2. Mild improvement in right basilar atelectasis.
3. No new abnormalities.

## 2006-10-18 IMAGING — CR DG CHEST 1V PORT
1 series · 1 of 1 positions shown · non-contrast
Comparison: 06/23/04.

CLINICAL DATA: Left effusion with bibasilar atelectasis.  Multiple rib fractures.
 PORTABLE CHEST - 1 VIEW 06/24/04:

[view not recorded]
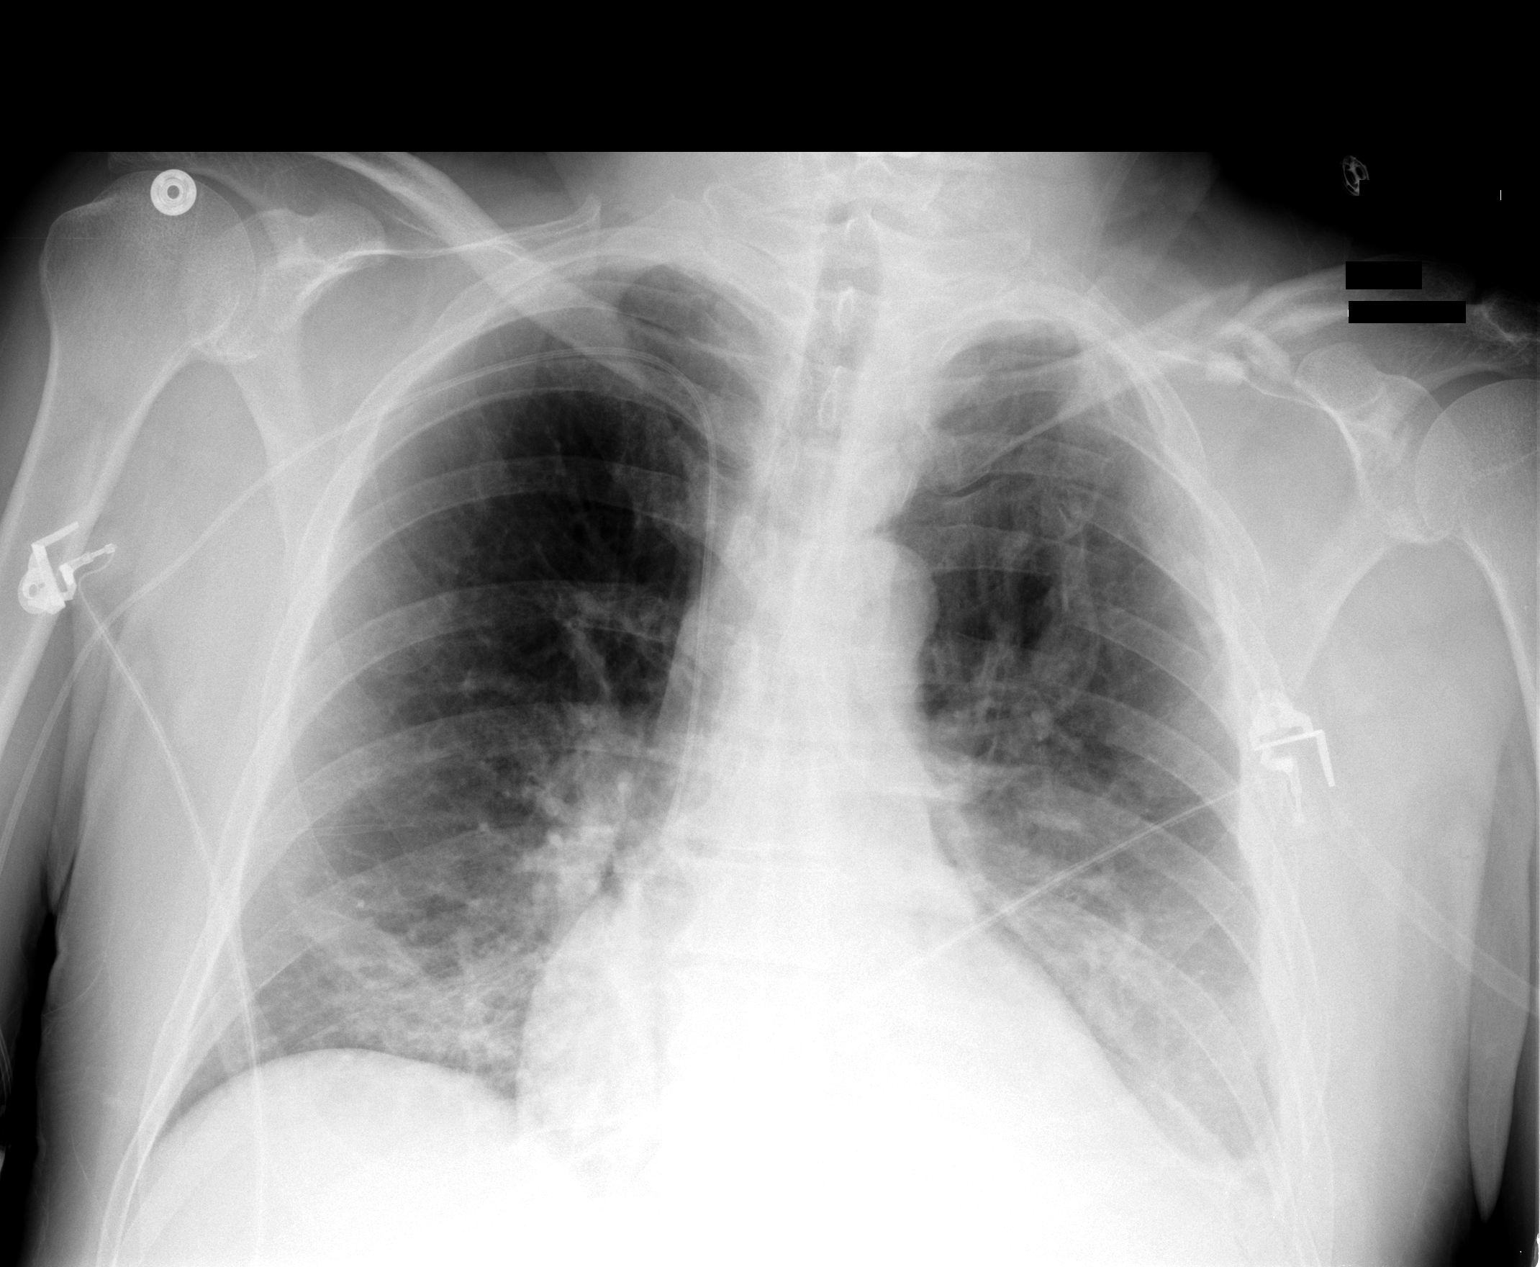

[1 of 1 positions shown; findings below may reference images not displayed]

PICC line tip remains in the superior vena cava.  The feeding tube has been removed.  Again noted are multiple left rib fractures.  There is no pneumothorax.  There is haziness at the left base consistent with small left effusion with some bibasilar atelectasis.  The overall aeration has slightly improved bilaterally.
IMPRESSION: Slight improvement in the aeration bilaterally with persistent left effusion and bibasilar atelectasis.

## 2006-12-24 ENCOUNTER — Ambulatory Visit: Payer: Self-pay | Admitting: Vascular Surgery

## 2007-09-04 ENCOUNTER — Encounter (INDEPENDENT_AMBULATORY_CARE_PROVIDER_SITE_OTHER): Payer: Self-pay | Admitting: Internal Medicine

## 2007-09-04 ENCOUNTER — Observation Stay (HOSPITAL_COMMUNITY): Admission: EM | Admit: 2007-09-04 | Discharge: 2007-09-06 | Payer: Self-pay | Admitting: Emergency Medicine

## 2007-09-05 ENCOUNTER — Ambulatory Visit: Payer: Self-pay | Admitting: Surgery

## 2007-12-25 ENCOUNTER — Emergency Department (HOSPITAL_COMMUNITY): Admission: EM | Admit: 2007-12-25 | Discharge: 2007-12-25 | Payer: Self-pay | Admitting: Family Medicine

## 2008-02-10 ENCOUNTER — Ambulatory Visit: Payer: Self-pay | Admitting: Internal Medicine

## 2008-02-10 ENCOUNTER — Encounter: Payer: Self-pay | Admitting: Internal Medicine

## 2008-02-10 DIAGNOSIS — E785 Hyperlipidemia, unspecified: Secondary | ICD-10-CM | POA: Insufficient documentation

## 2008-02-10 DIAGNOSIS — J449 Chronic obstructive pulmonary disease, unspecified: Secondary | ICD-10-CM | POA: Insufficient documentation

## 2008-02-10 DIAGNOSIS — I739 Peripheral vascular disease, unspecified: Secondary | ICD-10-CM

## 2008-02-10 DIAGNOSIS — E119 Type 2 diabetes mellitus without complications: Secondary | ICD-10-CM | POA: Insufficient documentation

## 2008-02-10 DIAGNOSIS — Z8619 Personal history of other infectious and parasitic diseases: Secondary | ICD-10-CM

## 2008-02-10 DIAGNOSIS — J4489 Other specified chronic obstructive pulmonary disease: Secondary | ICD-10-CM

## 2008-02-10 DIAGNOSIS — F528 Other sexual dysfunction not due to a substance or known physiological condition: Secondary | ICD-10-CM

## 2008-02-10 DIAGNOSIS — I10 Essential (primary) hypertension: Secondary | ICD-10-CM | POA: Insufficient documentation

## 2008-02-10 HISTORY — DX: Other specified chronic obstructive pulmonary disease: J44.89

## 2008-02-10 HISTORY — DX: Chronic obstructive pulmonary disease, unspecified: J44.9

## 2008-02-10 LAB — HM DIABETES FOOT EXAM

## 2008-02-10 LAB — CONVERTED CEMR LAB: INR: 3 — ABNORMAL HIGH (ref 0.0–1.5)

## 2008-02-14 ENCOUNTER — Encounter (INDEPENDENT_AMBULATORY_CARE_PROVIDER_SITE_OTHER): Payer: Self-pay | Admitting: Internal Medicine

## 2008-02-16 ENCOUNTER — Telehealth: Payer: Self-pay | Admitting: Internal Medicine

## 2008-02-16 LAB — CONVERTED CEMR LAB
ALT: 27 units/L (ref 0–53)
BUN: 14 mg/dL (ref 6–23)
CO2: 22 meq/L (ref 19–32)
Cholesterol: 389 mg/dL — ABNORMAL HIGH (ref 0–200)
Creatinine, Ser: 0.99 mg/dL (ref 0.40–1.50)
Glucose, Bld: 99 mg/dL (ref 70–99)
HDL: 38 mg/dL — ABNORMAL LOW (ref >39)
Microalb Creat Ratio: 97.6 mg/g — ABNORMAL HIGH (ref 0.0–30.0)
Testosterone Free: 66.5 pg/mL (ref 47.0–244.0)
Testosterone-% Free: 2.6 % (ref 1.6–2.9)
Total Bilirubin: 0.4 mg/dL (ref 0.3–1.2)
Total CHOL/HDL Ratio: 10.2
Triglycerides: 671 mg/dL — ABNORMAL HIGH (ref <150)

## 2008-02-21 ENCOUNTER — Encounter: Payer: Self-pay | Admitting: Internal Medicine

## 2008-02-21 ENCOUNTER — Ambulatory Visit: Payer: Self-pay | Admitting: Internal Medicine

## 2008-02-21 DIAGNOSIS — M79609 Pain in unspecified limb: Secondary | ICD-10-CM

## 2008-02-21 LAB — CONVERTED CEMR LAB

## 2008-02-22 ENCOUNTER — Ambulatory Visit: Payer: Self-pay | Admitting: Vascular Surgery

## 2008-02-29 ENCOUNTER — Ambulatory Visit: Payer: Self-pay | Admitting: Internal Medicine

## 2008-02-29 ENCOUNTER — Telehealth: Payer: Self-pay | Admitting: Internal Medicine

## 2008-02-29 ENCOUNTER — Ambulatory Visit (HOSPITAL_COMMUNITY): Admission: RE | Admit: 2008-02-29 | Discharge: 2008-02-29 | Payer: Self-pay | Admitting: Internal Medicine

## 2008-02-29 DIAGNOSIS — R233 Spontaneous ecchymoses: Secondary | ICD-10-CM | POA: Insufficient documentation

## 2008-02-29 LAB — CONVERTED CEMR LAB
ALT: 33 units/L (ref 0–53)
Albumin: 4 g/dL (ref 3.5–5.2)
Basophils Absolute: 0.1 10*3/uL (ref 0.0–0.1)
Basophils Relative: 1 % (ref 0–1)
CO2: 22 meq/L (ref 19–32)
Chloride: 104 meq/L (ref 96–112)
Cholesterol: 260 mg/dL — ABNORMAL HIGH (ref 0–200)
Glucose, Bld: 103 mg/dL — ABNORMAL HIGH (ref 70–99)
Hemoglobin: 14.9 g/dL (ref 13.0–17.0)
INR: 6
INR: 4.4 — ABNORMAL HIGH (ref 0.0–1.5)
Lymphocytes Relative: 41 % (ref 12–46)
Monocytes Absolute: 0.7 10*3/uL (ref 0.1–1.0)
Neutro Abs: 3.7 10*3/uL (ref 1.7–7.7)
Neutrophils Relative %: 44 % (ref 43–77)
Potassium: 4.4 meq/L (ref 3.5–5.3)
RBC: 4.45 M/uL (ref 4.22–5.81)
RDW: 13.1 % (ref 11.5–15.5)
Sodium: 135 meq/L (ref 135–145)
Total CK: 417 units/L — ABNORMAL HIGH (ref 7–232)
Total Protein: 7.7 g/dL (ref 6.0–8.3)
Triglycerides: 406 mg/dL — ABNORMAL HIGH (ref <150)
aPTT: 95 s — ABNORMAL HIGH (ref 24–37)

## 2008-03-01 ENCOUNTER — Encounter (INDEPENDENT_AMBULATORY_CARE_PROVIDER_SITE_OTHER): Payer: Self-pay | Admitting: Internal Medicine

## 2008-03-01 ENCOUNTER — Ambulatory Visit: Payer: Self-pay | Admitting: *Deleted

## 2008-03-01 DIAGNOSIS — R0602 Shortness of breath: Secondary | ICD-10-CM

## 2008-03-01 LAB — CONVERTED CEMR LAB: INR: 3.4

## 2008-03-02 ENCOUNTER — Ambulatory Visit: Payer: Self-pay | Admitting: *Deleted

## 2008-03-02 DIAGNOSIS — I724 Aneurysm of artery of lower extremity: Secondary | ICD-10-CM | POA: Insufficient documentation

## 2008-03-06 ENCOUNTER — Encounter: Payer: Self-pay | Admitting: Internal Medicine

## 2008-03-06 ENCOUNTER — Ambulatory Visit: Payer: Self-pay | Admitting: Internal Medicine

## 2008-03-06 DIAGNOSIS — R5381 Other malaise: Secondary | ICD-10-CM | POA: Insufficient documentation

## 2008-03-06 DIAGNOSIS — R5383 Other fatigue: Secondary | ICD-10-CM

## 2008-03-06 DIAGNOSIS — R252 Cramp and spasm: Secondary | ICD-10-CM | POA: Insufficient documentation

## 2008-03-08 LAB — CONVERTED CEMR LAB
Prothrombin Time: 13.4 s (ref 11.6–15.2)
TSH: 1.872 microintl units/mL (ref 0.350–4.50)

## 2008-03-12 ENCOUNTER — Encounter: Payer: Self-pay | Admitting: Internal Medicine

## 2008-03-16 ENCOUNTER — Ambulatory Visit: Payer: Self-pay | Admitting: Vascular Surgery

## 2008-03-16 ENCOUNTER — Telehealth: Payer: Self-pay | Admitting: Internal Medicine

## 2008-03-18 ENCOUNTER — Encounter: Payer: Self-pay | Admitting: Internal Medicine

## 2008-03-18 ENCOUNTER — Ambulatory Visit (HOSPITAL_BASED_OUTPATIENT_CLINIC_OR_DEPARTMENT_OTHER): Admission: RE | Admit: 2008-03-18 | Discharge: 2008-03-18 | Payer: Self-pay | Admitting: Internal Medicine

## 2008-03-22 ENCOUNTER — Ambulatory Visit (HOSPITAL_COMMUNITY): Admission: RE | Admit: 2008-03-22 | Discharge: 2008-03-22 | Payer: Self-pay | Admitting: Surgery

## 2008-03-22 ENCOUNTER — Ambulatory Visit: Payer: Self-pay | Admitting: Surgery

## 2008-03-24 ENCOUNTER — Ambulatory Visit: Payer: Self-pay | Admitting: Internal Medicine

## 2008-03-27 ENCOUNTER — Telehealth: Payer: Self-pay | Admitting: Internal Medicine

## 2008-04-02 ENCOUNTER — Encounter: Payer: Self-pay | Admitting: Internal Medicine

## 2008-04-04 ENCOUNTER — Telehealth: Payer: Self-pay | Admitting: Internal Medicine

## 2008-04-06 ENCOUNTER — Encounter: Payer: Self-pay | Admitting: Internal Medicine

## 2008-04-06 ENCOUNTER — Ambulatory Visit: Payer: Self-pay | Admitting: *Deleted

## 2008-04-06 DIAGNOSIS — J069 Acute upper respiratory infection, unspecified: Secondary | ICD-10-CM | POA: Insufficient documentation

## 2008-04-06 DIAGNOSIS — M5126 Other intervertebral disc displacement, lumbar region: Secondary | ICD-10-CM

## 2008-04-06 DIAGNOSIS — G4733 Obstructive sleep apnea (adult) (pediatric): Secondary | ICD-10-CM

## 2008-04-06 LAB — CONVERTED CEMR LAB
Bilirubin Urine: NEGATIVE
Blood Glucose, Fingerstick: 238
Hgb A1c MFr Bld: 6.3 %
Leukocytes, UA: NEGATIVE
Nitrite: NEGATIVE
Protein, ur: NEGATIVE mg/dL
RBC / HPF: NONE SEEN (ref <3)
Specific Gravity, Urine: 1.019 (ref 1.005–1.030)
Urobilinogen, UA: 1 (ref 0.0–1.0)
WBC, UA: NONE SEEN cells/hpf (ref <3)

## 2008-04-18 ENCOUNTER — Telehealth (INDEPENDENT_AMBULATORY_CARE_PROVIDER_SITE_OTHER): Payer: Self-pay | Admitting: Pharmacy Technician

## 2008-04-18 ENCOUNTER — Ambulatory Visit: Payer: Self-pay | Admitting: Vascular Surgery

## 2008-04-20 ENCOUNTER — Ambulatory Visit: Payer: Self-pay | Admitting: Vascular Surgery

## 2008-05-02 ENCOUNTER — Telehealth: Payer: Self-pay | Admitting: *Deleted

## 2008-05-07 ENCOUNTER — Telehealth: Payer: Self-pay | Admitting: Internal Medicine

## 2008-05-15 DIAGNOSIS — M533 Sacrococcygeal disorders, not elsewhere classified: Secondary | ICD-10-CM

## 2008-05-21 ENCOUNTER — Telehealth: Payer: Self-pay | Admitting: Internal Medicine

## 2008-05-21 ENCOUNTER — Encounter: Payer: Self-pay | Admitting: Internal Medicine

## 2008-05-25 ENCOUNTER — Ambulatory Visit: Payer: Self-pay | Admitting: Internal Medicine

## 2008-05-28 ENCOUNTER — Telehealth (INDEPENDENT_AMBULATORY_CARE_PROVIDER_SITE_OTHER): Payer: Self-pay | Admitting: Pharmacy Technician

## 2008-05-29 ENCOUNTER — Telehealth (INDEPENDENT_AMBULATORY_CARE_PROVIDER_SITE_OTHER): Payer: Self-pay | Admitting: Internal Medicine

## 2008-05-30 ENCOUNTER — Ambulatory Visit (HOSPITAL_COMMUNITY): Admission: RE | Admit: 2008-05-30 | Discharge: 2008-05-30 | Payer: Self-pay | Admitting: Internal Medicine

## 2008-06-01 ENCOUNTER — Ambulatory Visit: Payer: Self-pay | Admitting: Internal Medicine

## 2008-06-04 ENCOUNTER — Encounter: Payer: Self-pay | Admitting: Internal Medicine

## 2008-06-08 ENCOUNTER — Ambulatory Visit: Payer: Self-pay | Admitting: *Deleted

## 2008-06-08 ENCOUNTER — Telehealth: Payer: Self-pay | Admitting: *Deleted

## 2008-06-13 ENCOUNTER — Telehealth: Payer: Self-pay | Admitting: Internal Medicine

## 2008-06-29 ENCOUNTER — Encounter: Payer: Self-pay | Admitting: Internal Medicine

## 2008-06-29 ENCOUNTER — Telehealth: Payer: Self-pay | Admitting: Internal Medicine

## 2008-06-29 ENCOUNTER — Ambulatory Visit: Payer: Self-pay | Admitting: Internal Medicine

## 2008-06-29 LAB — CONVERTED CEMR LAB
Alkaline Phosphatase: 75 units/L (ref 39–117)
BUN: 30 mg/dL — ABNORMAL HIGH (ref 6–23)
Basophils Absolute: 0.1 10*3/uL (ref 0.0–0.1)
CO2: 25 meq/L (ref 19–32)
Creatinine, Ser: 1.49 mg/dL (ref 0.40–1.50)
GFR calc Af Amer: >60 mL/min (ref >60)
GFR calc non Af Amer: 50 mL/min — ABNORMAL LOW (ref >60)
Glucose, Bld: 113 mg/dL — ABNORMAL HIGH (ref 70–99)
HCT: 46.5 % (ref 39.0–52.0)
Hemoglobin: 15.6 g/dL (ref 13.0–17.0)
Lymphocytes Relative: 38 % (ref 12–46)
Lymphs Abs: 4.2 10*3/uL — ABNORMAL HIGH (ref 0.7–4.0)
Magnesium: 2.3 mg/dL (ref 1.5–2.5)
Monocytes Absolute: 0.9 10*3/uL (ref 0.1–1.0)
Neutro Abs: 5.3 10*3/uL (ref 1.7–7.7)
Phosphorus: 3.8 mg/dL (ref 2.3–4.6)
RDW: 13.6 % (ref 11.5–15.5)
Sodium: 137 meq/L (ref 135–145)
Total Bilirubin: 0.6 mg/dL (ref 0.3–1.2)
Total CK: 251 units/L — ABNORMAL HIGH (ref 7–232)
Total Protein: 8.4 g/dL — ABNORMAL HIGH (ref 6.0–8.3)
WBC: 11 10*3/uL — ABNORMAL HIGH (ref 4.0–10.5)

## 2008-07-12 ENCOUNTER — Telehealth: Payer: Self-pay | Admitting: Internal Medicine

## 2008-07-18 ENCOUNTER — Encounter: Payer: Self-pay | Admitting: Internal Medicine

## 2008-07-23 ENCOUNTER — Telehealth: Payer: Self-pay | Admitting: Internal Medicine

## 2008-07-23 ENCOUNTER — Telehealth: Payer: Self-pay | Admitting: *Deleted

## 2008-07-30 ENCOUNTER — Inpatient Hospital Stay (HOSPITAL_COMMUNITY): Admission: RE | Admit: 2008-07-30 | Discharge: 2008-08-01 | Payer: Self-pay | Admitting: Vascular Surgery

## 2008-07-30 ENCOUNTER — Encounter: Payer: Self-pay | Admitting: Vascular Surgery

## 2008-07-30 ENCOUNTER — Ambulatory Visit: Payer: Self-pay | Admitting: Vascular Surgery

## 2008-07-31 ENCOUNTER — Encounter: Payer: Self-pay | Admitting: Vascular Surgery

## 2008-08-03 ENCOUNTER — Telehealth: Payer: Self-pay | Admitting: Internal Medicine

## 2008-08-08 ENCOUNTER — Encounter: Admission: RE | Admit: 2008-08-08 | Discharge: 2008-10-05 | Payer: Self-pay | Admitting: Internal Medicine

## 2008-08-13 ENCOUNTER — Ambulatory Visit: Payer: Self-pay | Admitting: Internal Medicine

## 2008-08-14 ENCOUNTER — Encounter: Payer: Self-pay | Admitting: Internal Medicine

## 2008-08-16 ENCOUNTER — Telehealth: Payer: Self-pay | Admitting: Internal Medicine

## 2008-08-16 ENCOUNTER — Inpatient Hospital Stay (HOSPITAL_COMMUNITY): Admission: EM | Admit: 2008-08-16 | Discharge: 2008-08-21 | Payer: Self-pay | Admitting: Emergency Medicine

## 2008-08-16 ENCOUNTER — Encounter: Payer: Self-pay | Admitting: Internal Medicine

## 2008-08-16 ENCOUNTER — Ambulatory Visit: Payer: Self-pay | Admitting: Internal Medicine

## 2008-08-17 ENCOUNTER — Ambulatory Visit: Payer: Self-pay | Admitting: Internal Medicine

## 2008-08-17 ENCOUNTER — Telehealth: Payer: Self-pay | Admitting: Internal Medicine

## 2008-08-20 ENCOUNTER — Encounter (INDEPENDENT_AMBULATORY_CARE_PROVIDER_SITE_OTHER): Payer: Self-pay | Admitting: Neurology

## 2008-08-20 ENCOUNTER — Ambulatory Visit: Payer: Self-pay | Admitting: *Deleted

## 2008-08-22 ENCOUNTER — Telehealth (INDEPENDENT_AMBULATORY_CARE_PROVIDER_SITE_OTHER): Payer: Self-pay | Admitting: Internal Medicine

## 2008-08-22 ENCOUNTER — Encounter: Payer: Self-pay | Admitting: Internal Medicine

## 2008-08-23 ENCOUNTER — Telehealth: Payer: Self-pay | Admitting: *Deleted

## 2008-08-27 ENCOUNTER — Telehealth (INDEPENDENT_AMBULATORY_CARE_PROVIDER_SITE_OTHER): Payer: Self-pay | Admitting: Internal Medicine

## 2008-08-28 ENCOUNTER — Ambulatory Visit: Payer: Self-pay | Admitting: Internal Medicine

## 2008-08-28 DIAGNOSIS — K625 Hemorrhage of anus and rectum: Secondary | ICD-10-CM | POA: Insufficient documentation

## 2008-08-28 DIAGNOSIS — F411 Generalized anxiety disorder: Secondary | ICD-10-CM

## 2008-08-28 DIAGNOSIS — R110 Nausea: Secondary | ICD-10-CM | POA: Insufficient documentation

## 2008-08-28 LAB — CONVERTED CEMR LAB
ALT: 42 units/L (ref 0–53)
BUN: 17 mg/dL (ref 6–23)
Basophils Relative: 1 % (ref 0–1)
CO2: 24 meq/L (ref 19–32)
Calcium: 10 mg/dL (ref 8.4–10.5)
Chloride: 102 meq/L (ref 96–112)
Creatinine, Ser: 1.01 mg/dL (ref 0.40–1.50)
Eosinophils Absolute: 0.5 10*3/uL (ref 0.0–0.7)
Eosinophils Relative: 5 % (ref 0–5)
Glucose, Bld: 101 mg/dL — ABNORMAL HIGH (ref 70–99)
HCT: 42.1 % (ref 39.0–52.0)
Lipase: 29 units/L (ref 0–75)
Lymphs Abs: 4.3 10*3/uL — ABNORMAL HIGH (ref 0.7–4.0)
MCHC: 32.8 g/dL (ref 30.0–36.0)
MCV: 89.6 fL (ref >=78.0)
Monocytes Absolute: 0.8 10*3/uL (ref 0.1–1.0)
Monocytes Relative: 8 % (ref 3–12)
RBC: 4.7 M/uL (ref 4.22–5.81)
WBC: 10.7 10*3/uL — ABNORMAL HIGH (ref 4.0–10.5)

## 2008-08-30 ENCOUNTER — Telehealth: Payer: Self-pay | Admitting: *Deleted

## 2008-09-04 ENCOUNTER — Encounter: Payer: Self-pay | Admitting: Internal Medicine

## 2008-09-04 ENCOUNTER — Ambulatory Visit: Payer: Self-pay | Admitting: Internal Medicine

## 2008-09-04 DIAGNOSIS — J310 Chronic rhinitis: Secondary | ICD-10-CM | POA: Insufficient documentation

## 2008-09-04 LAB — CONVERTED CEMR LAB
Benzodiazepines.: NEGATIVE
Blood Glucose, Fingerstick: 182
Creatinine,U: 85 mg/dL
Opiates: NEGATIVE
Phencyclidine (PCP): NEGATIVE
Propoxyphene: NEGATIVE

## 2008-09-05 ENCOUNTER — Encounter: Payer: Self-pay | Admitting: Interventional Radiology

## 2008-09-25 IMAGING — CR DG CHEST 1V PORT
1 series · 1 of 1 positions shown · non-contrast
Comparison: 06/30/04.

CLINICAL DATA: Right lower extremity ischemia.  Peripheral vascular disease.  COPD.  
PORTABLE CHEST:

[view not recorded]
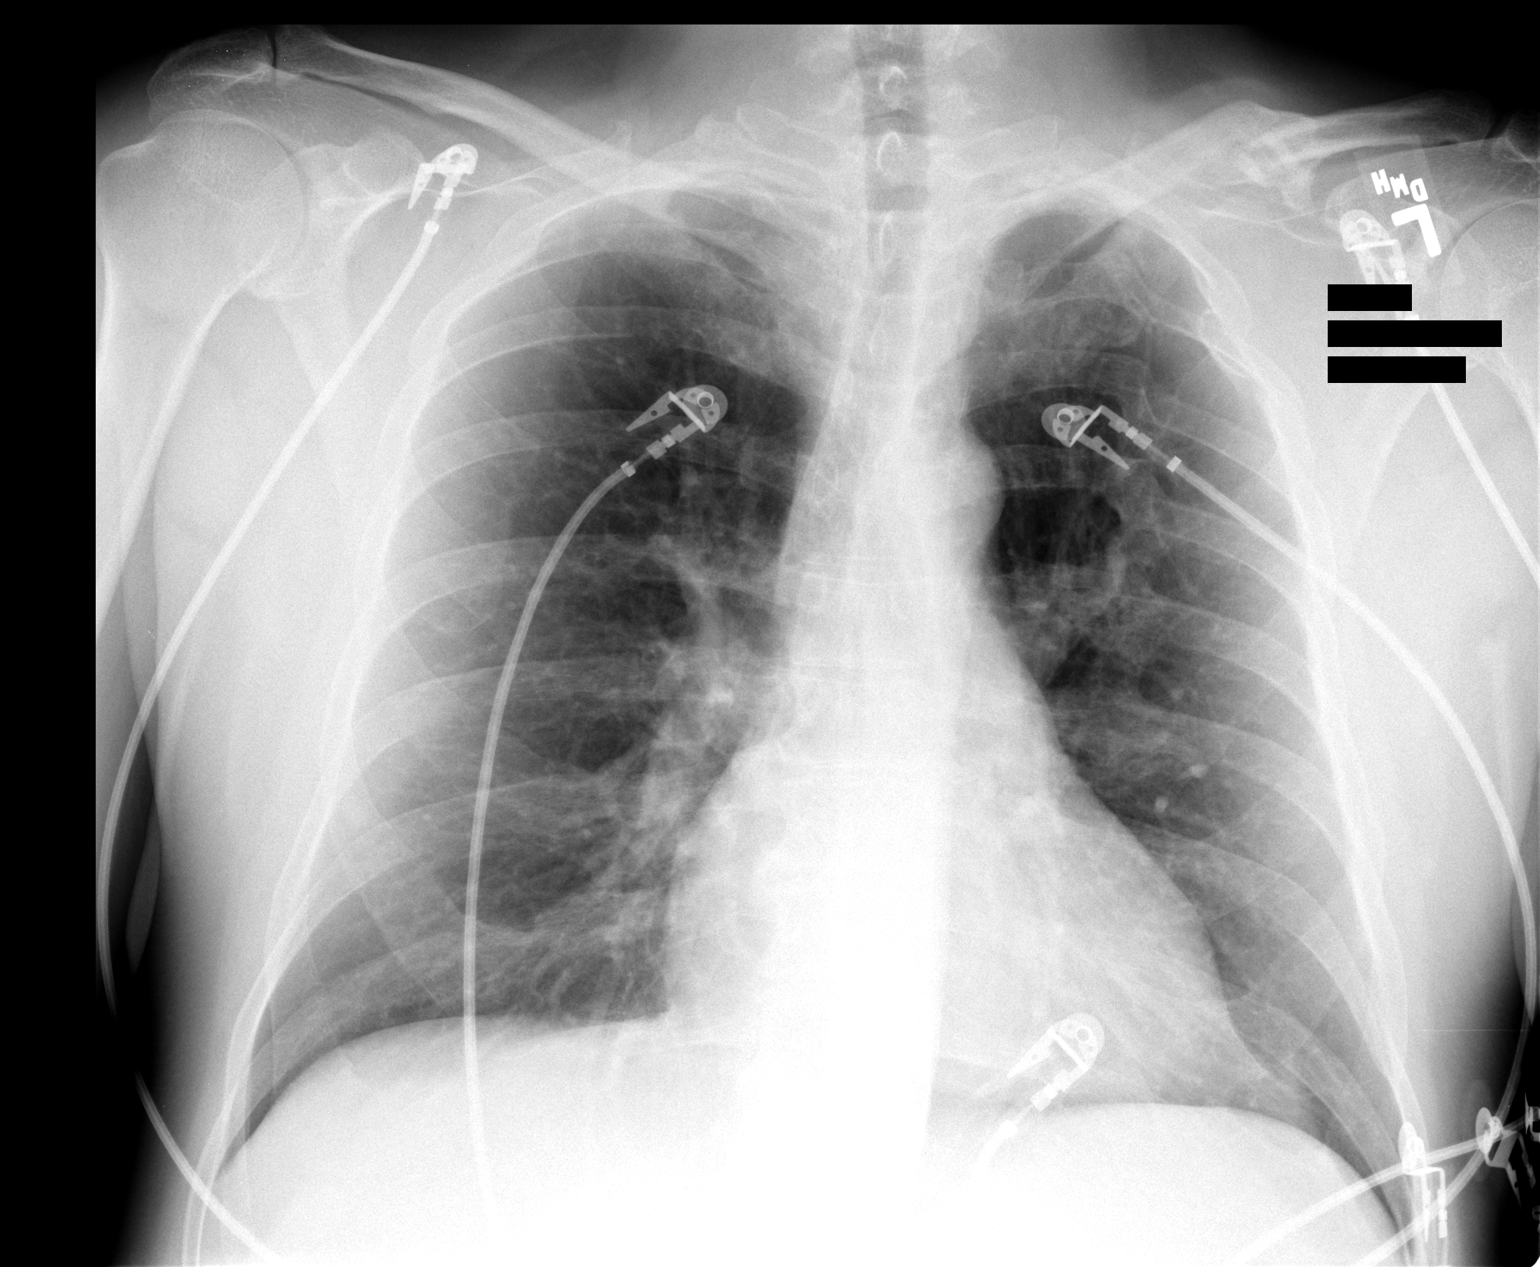

[1 of 1 positions shown; findings below may reference images not displayed]

FINDINGS: The heart size is normal.  Both lungs are clear.  Multiple of left rib fracture deformities are again noted as well as old left clavicle fracture.
IMPRESSION: No active cardiopulmonary disease.

## 2008-09-25 IMAGING — XA IR ANGIO/EXT/BI
1 series · 12 of 24 positions shown · non-contrast
Comparison: none

CLINICAL DATA: Right fem-pop graft greater than 10 years ago. Right foot pain.

Bilateral lower extremity arteriogram:
Right fem-pop arterial graft angioplasty:
Catheter directed thrombolysis:
TECHNIQUE: The left-sided approach was selected   to facilitate bilateral lower
extremity arteriography as requested by Dr. Lemoine. The   left groin region was
prepped with Betadine, draped in usual sterile fashion, infiltrated locally with
1% lidocaine. The left common femoral artery was accessed under real-time
ultrasound guidance with a 21-gauge micropuncture needle, exchanged over a 018
wire for transitional dilator which allowed advancement of a Bentson wire into
the abdominal aorta. Over this, a 5 French pigtail catheter was advanced for
bilateral lower extremity arteriography. The pigtail catheter was  exchanged
over this guidewire for a 35 cm 5 French vascular sheath, positioned with its
tip in the right external iliac artery. Through this, a 5 French Kumpe catheter
was advanced and used to selectively catheterize the right fem-pop graft. Graft
injection was performed. Patient was heparinized. The Kumpe was exchanged over a
Rosen wire for [place] Jumper catheter which was advanced beyond the distal
anastomosis into the native arterial outflow. This catheter was exchanged over a
018 steel core wire for a 5 mm x 4 cm Slalom angioplasty catheter, used to
dilate the distal graft and distal anastomosis. This catheter was exchanged for
the Nya catheter and followup arteriography of the treatment region was
obtained. Nya was exchanged for a 130 cm infusion catheter with 50 cm
infusion length, spanning fem-pop graft including distal anastomosis. A catheter
directed thrombolytic infusion of tenecteplase at 0.25 mg per hour was
initiated. The sheath and catheter were secured externally and the patient was
transferred to the ICU for overnight thrombolytic infusion. Patient tolerated
the procedure  well, with no immediate complication.

[Series 1: run · 12 of 85 slices shown]
[im 4/85]
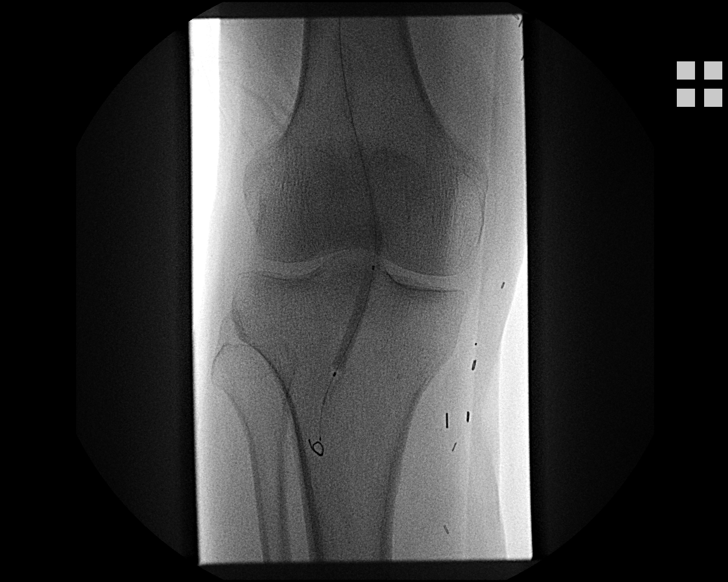
[im 11/85]
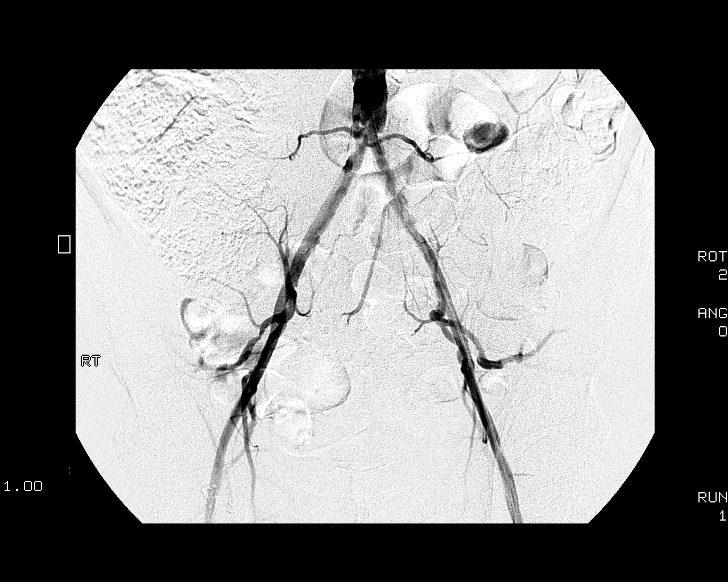
[im 19/85]
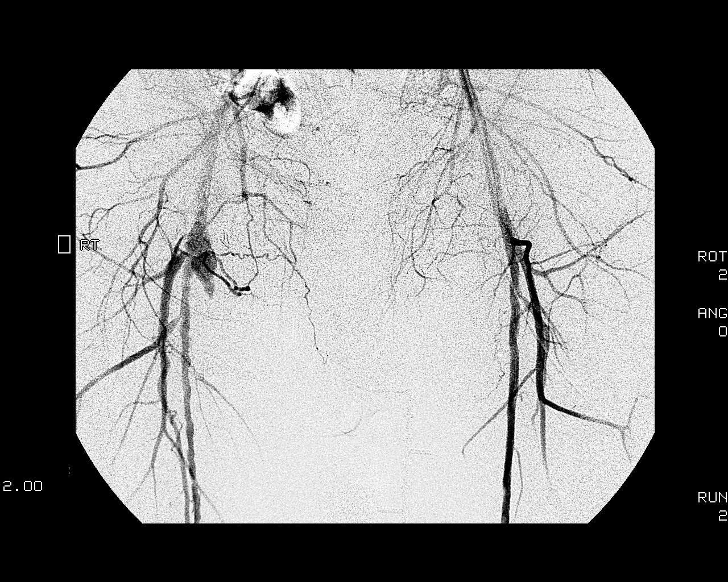
[im 26/85]
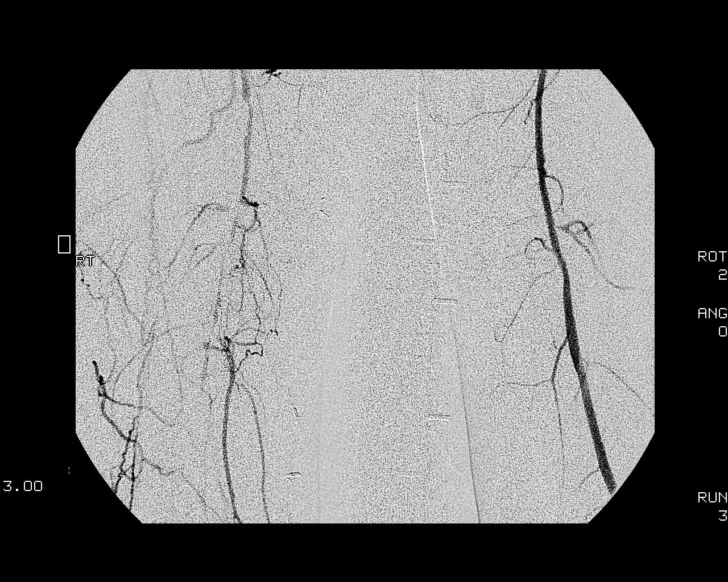
[im 33/85]
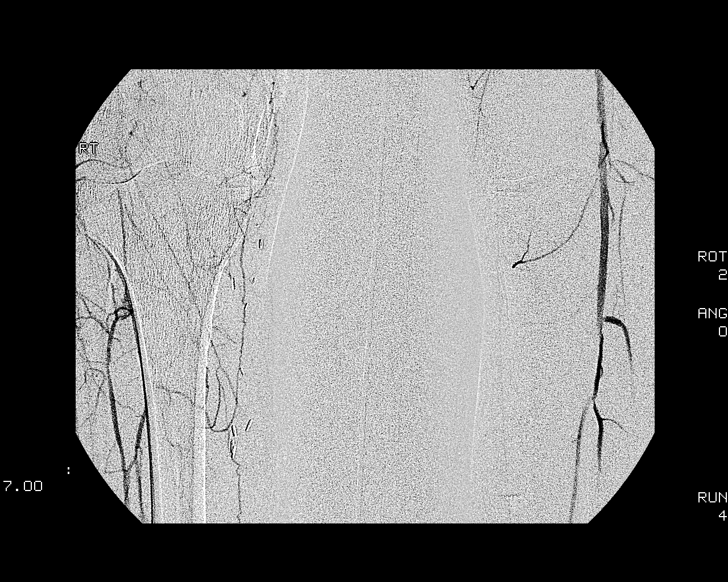
[im 41/85]
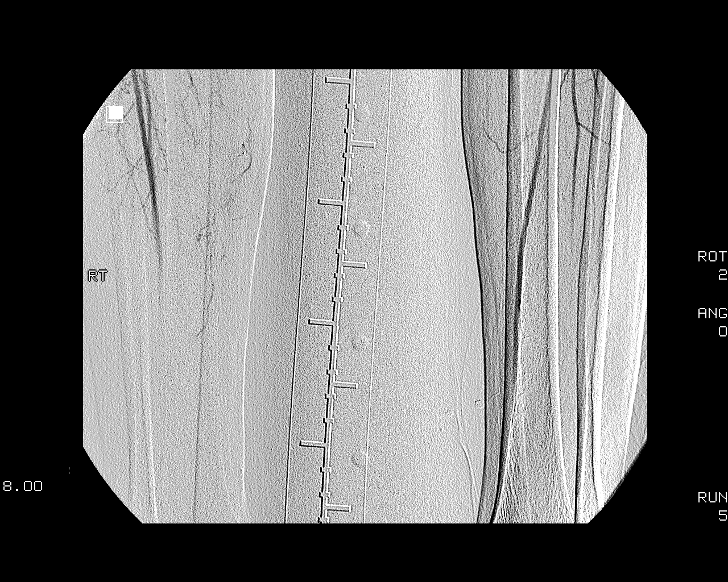
[im 48/85]
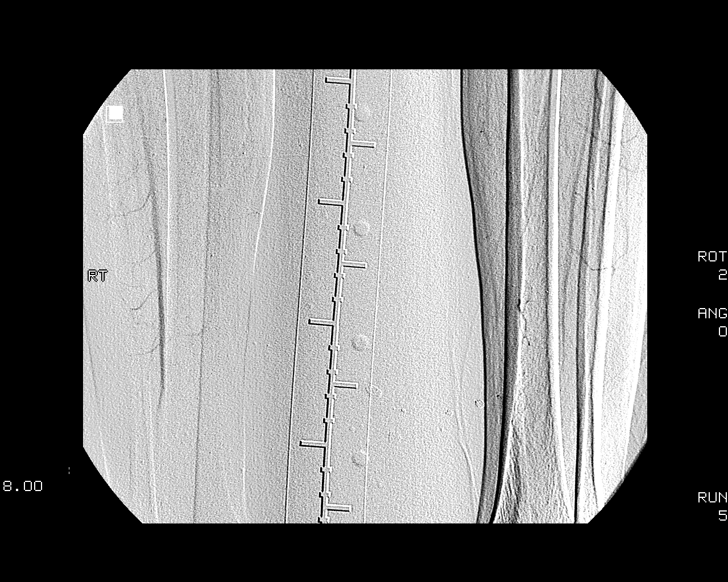
[im 55/85]
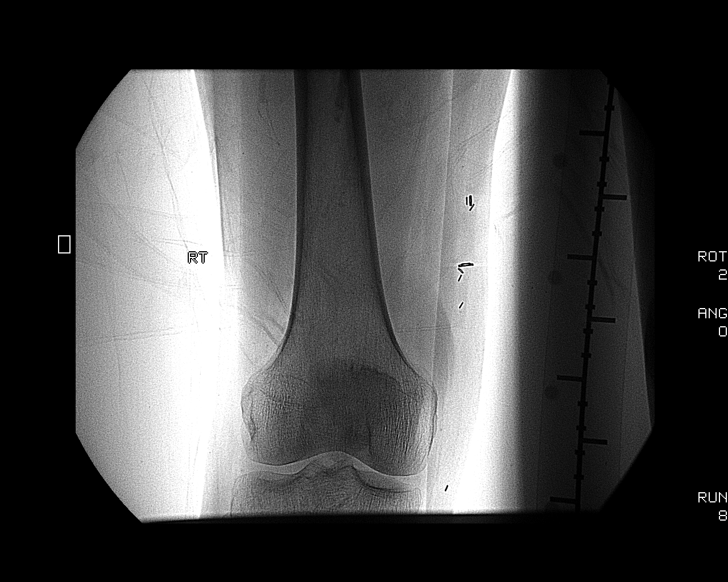
[im 63/85]
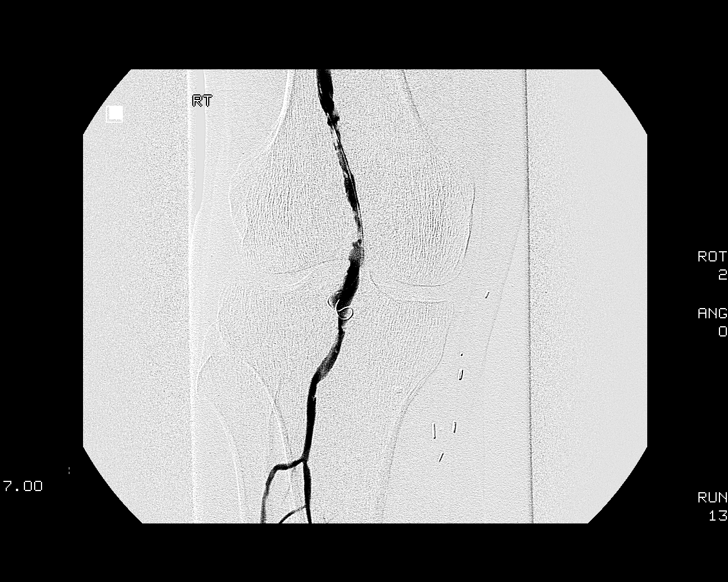
[im 70/85]
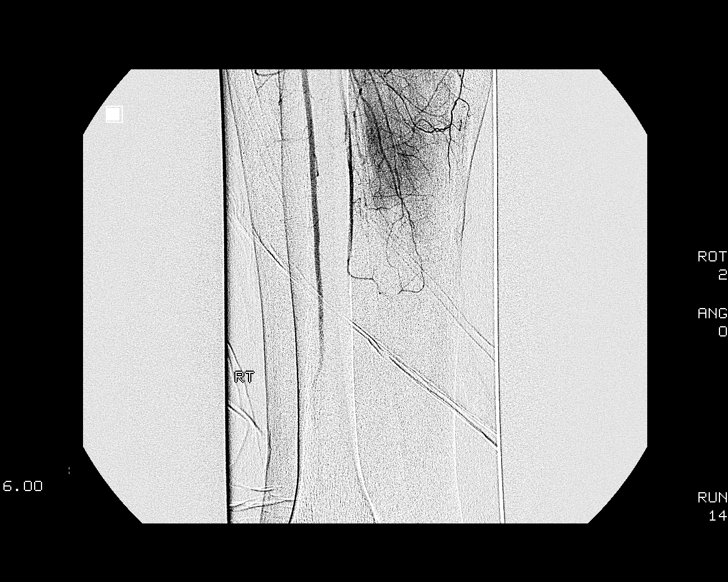
[im 77/85]
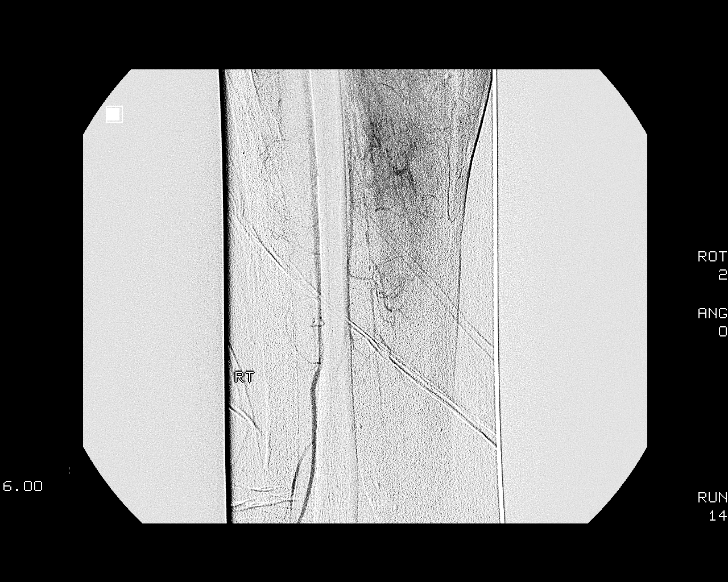
[im 85/85]
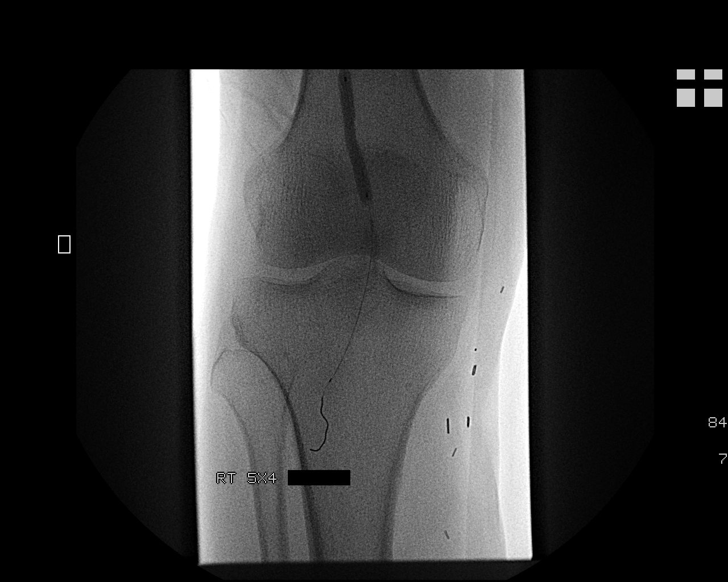

[12 of 24 positions shown; findings below may reference images not displayed]

FINDINGS: There is diffuse atheromatous irregularity of the infrarenal abdominal
aorta and common iliac  arteries without significant stenosis.

On the left, external iliac artery widely patent. Common femoral artery,
profunda femoris, SFA, and popliteal artery show mild atheromatous change
without significant stenosis. The anterior tibial artery is seen to the proximal
calf level. The peroneal artery extent at least as far as the lower calf level.
The posterior tibial artery is seen to the ankle level.

On the right, external iliac and common femoral arteries widely patent. There is
50% short segment ostial stenosis of the profunda femoris. Diffuse atheromatous
irregularity of the SFA which occludes in the mid thigh. Muscular collaterals  
reconstitute the anterior tibial artery, which crosses the foot as the primary
blood supply. The fem-pop graft is occluded. Once this was selectively
catheterized, proximal two-thirds of the graft were noted to be widely patent.
There is thrombus the distal third graft. There is a high-grade stenosis above
the level of the knee within the graft and a tandem stenosis of the distal
anastomosis. The stenoses responded well to 5 mm balloon angioplasty, restoring
slow in-line flow to the popliteal artery below the knee. Moderate thrombus
remained in the distal graft. Thrombolysis was initiated.
IMPRESSION: 1. Occluded right fem-pop graft,   successfully selectively catheterized. After
angioplasty of tandem stenoses in the distal graft and at the distal
anastomosis, a catheter directed thrombolytic infusion was initiated.

## 2008-09-26 ENCOUNTER — Telehealth: Payer: Self-pay | Admitting: *Deleted

## 2008-09-26 ENCOUNTER — Emergency Department (HOSPITAL_COMMUNITY): Admission: EM | Admit: 2008-09-26 | Discharge: 2008-09-26 | Payer: Self-pay | Admitting: Emergency Medicine

## 2008-09-26 IMAGING — XA IR ANGIO/EXISTING CATHETER
6 series · 15 of 24 positions shown · non-contrast
Comparison: none

CLINICAL DATA: Clot in the right femoral-popliteal bypass graft.  The patient has been on tenecteplase for 20 hours.  This is a follow-up examination.
FOLLOW-UP ANGIOGRAM FOR THROMBOLYSIS ? 06/03/06: 
Procedure:  The existing left groin catheter was prepped and draped in a sterile fashion.  The infusion wire was removed and contrast was injected through the catheter which was positioned in the tibial-peroneal trunk.  There is filling of the tibial-peroneal trunk as well as the peroneal artery and the right anterior tibial artery.  There is patency of these vessels down to the ankle but the flow is very slow suggesting distal runoff disease in the foot.  Additional images of the graft were obtained.  Since the initiation of the thrombolytic therapy, there has been opacification of an aneurysm in the distal graft near the knee joint.  There is residual clot just proximal to the aneurysm formation.  The catheter was exchanged for a wire and injection into the sheath demonstrates preferential flow through the deep femoral veins and not into the graft.   As a result, a new infusion catheter was placed measuring 40 cm in infusion length.  The plan is to continue infusion therapy overnight and have the patient return in the morning.

[Series 1: run · 7 of 58 slices shown (1 of 6)]
[im 1/58]
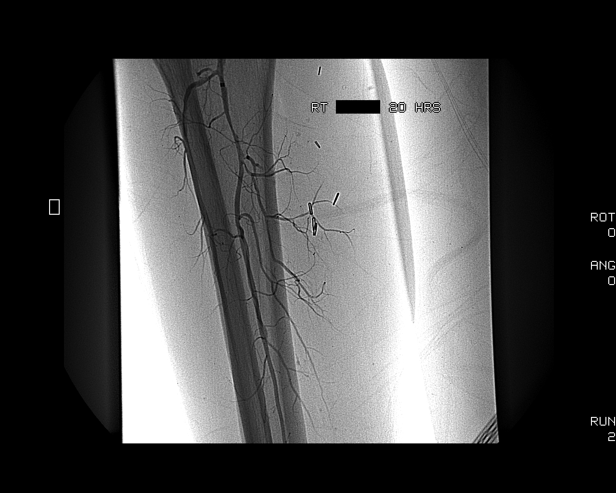
[im 12/58]
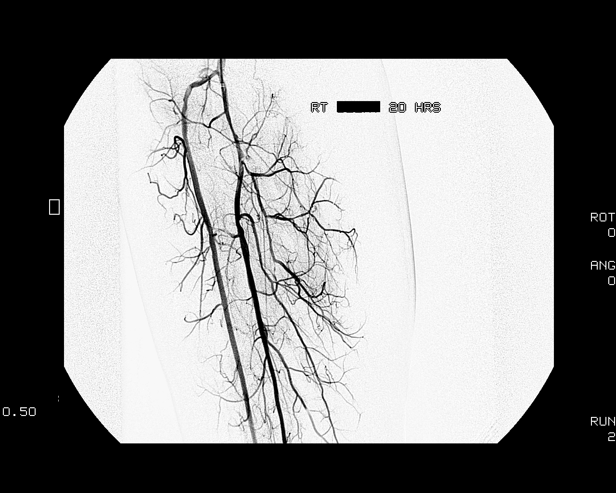
[im 23/58]
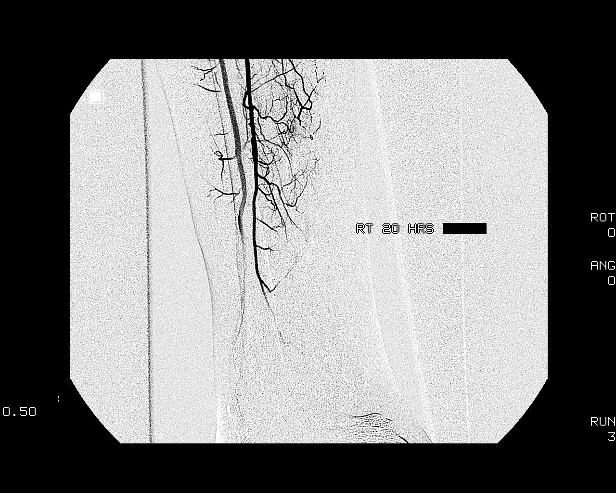
[im 29/58]
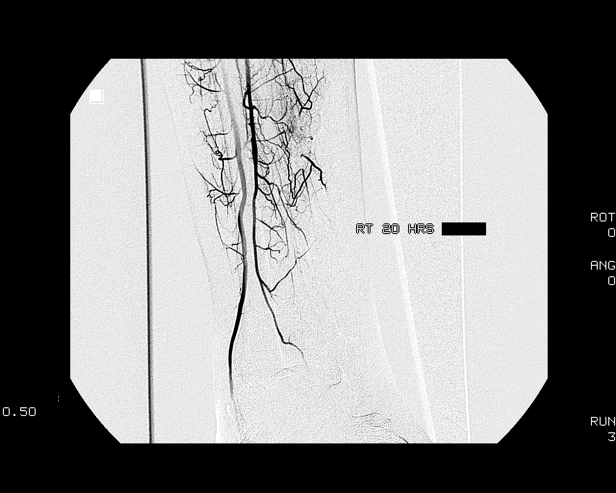
[im 40/58]
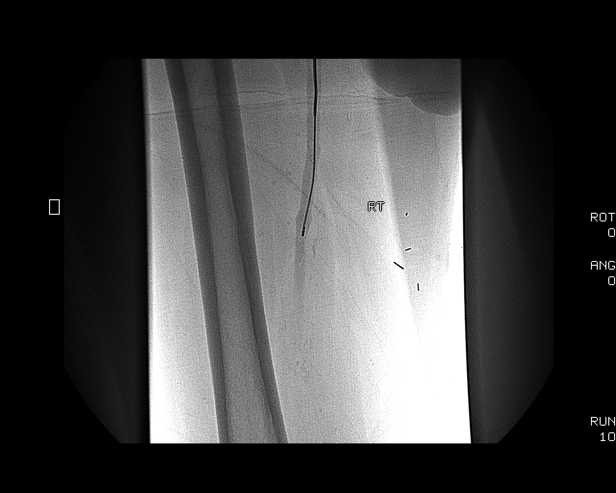
[im 46/58]
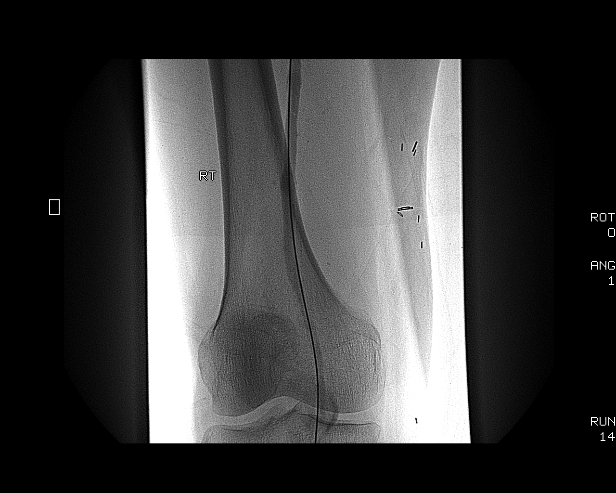
[im 58/58]
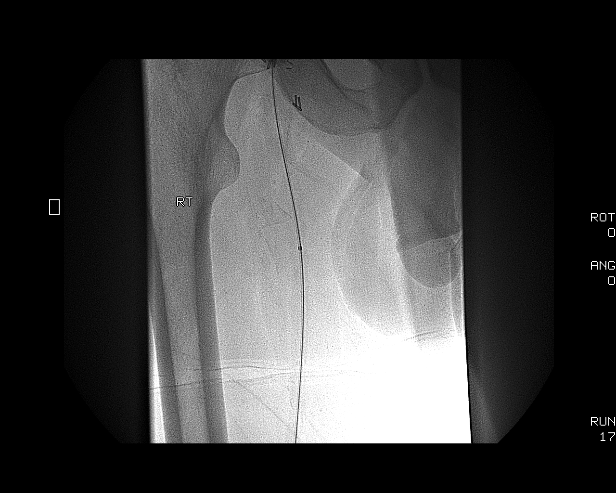

[Series 1: run · 3 of 25 slices shown (2 of 6)]
[im 7/25]
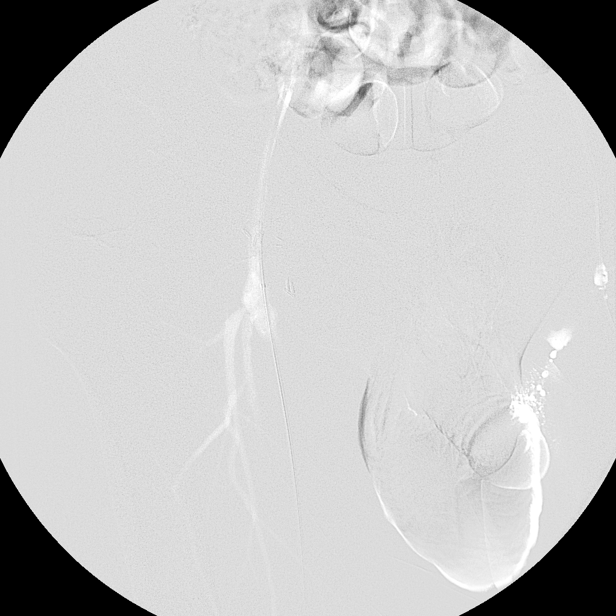
[im 13/25]
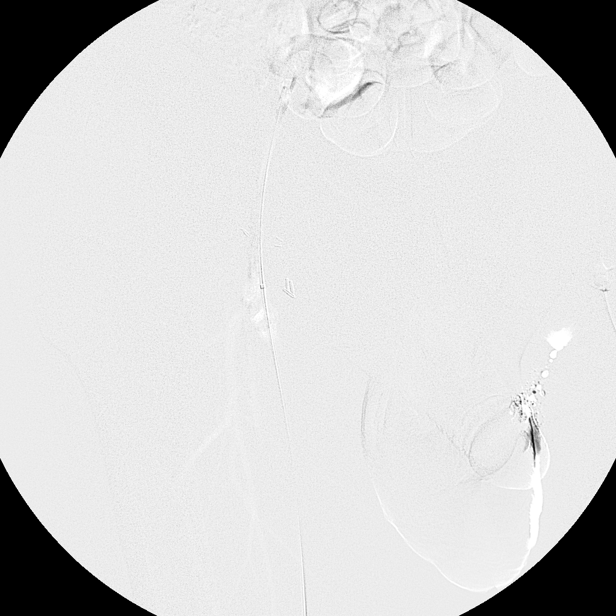
[im 25/25]
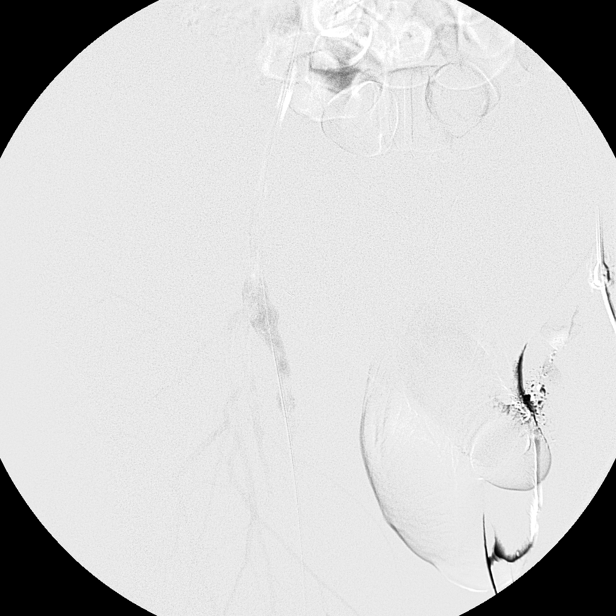

[Series 4: run · 1 of 9 slices shown (3 of 6)]
[im 1/9]
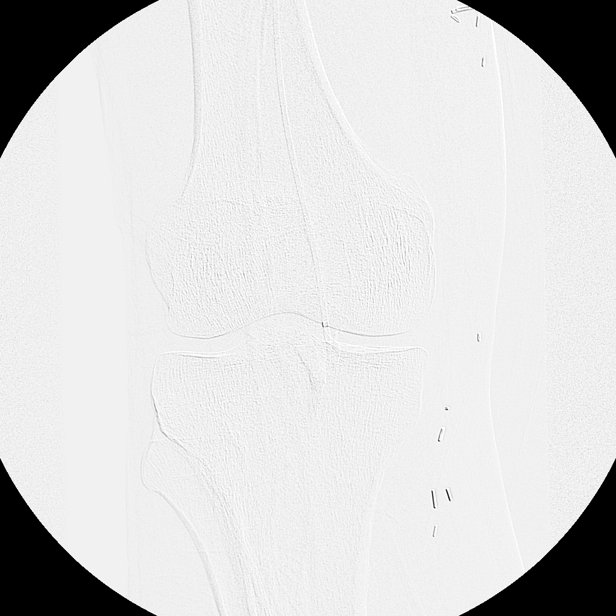

[Series 5: run · 1 of 1 slices shown (4 of 6)]
[im 1/1]
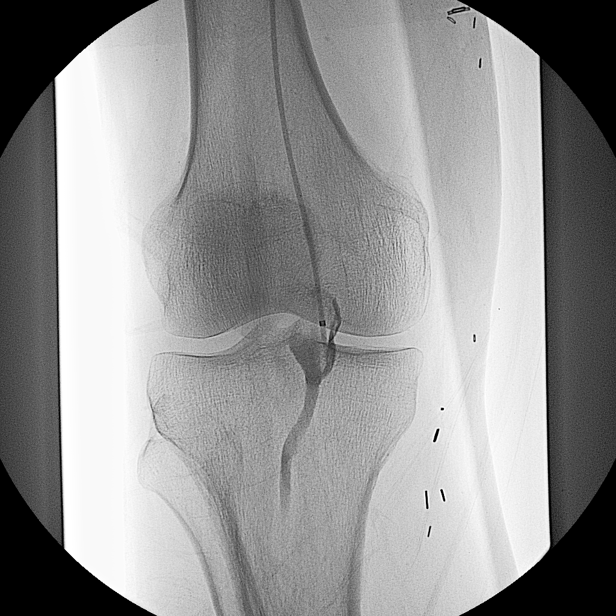

[Series 6: run · 1 of 8 slices shown (5 of 6)]
[im 8/8]
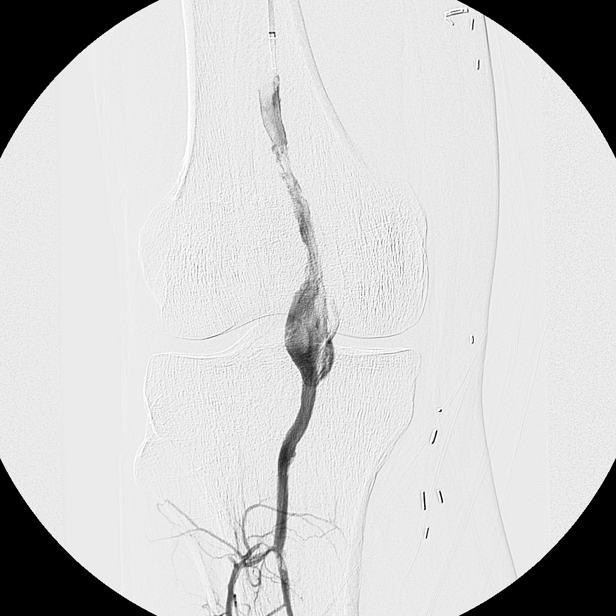

[Series 7: run · 2 of 15 slices shown (6 of 6)]
[im 1/15]
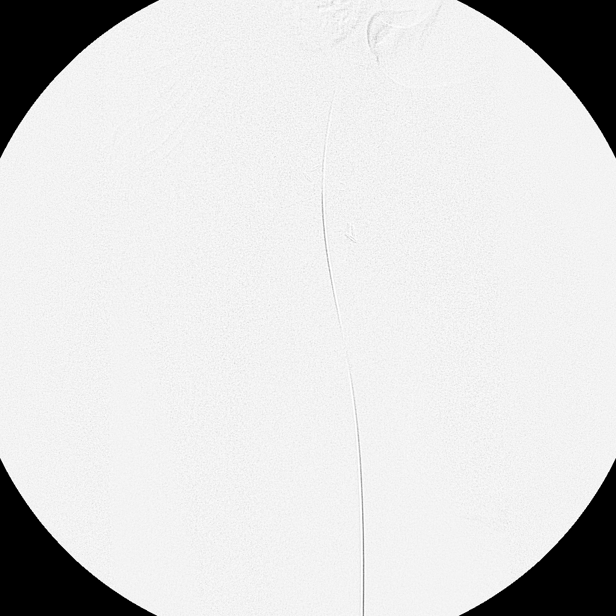
[im 15/15]
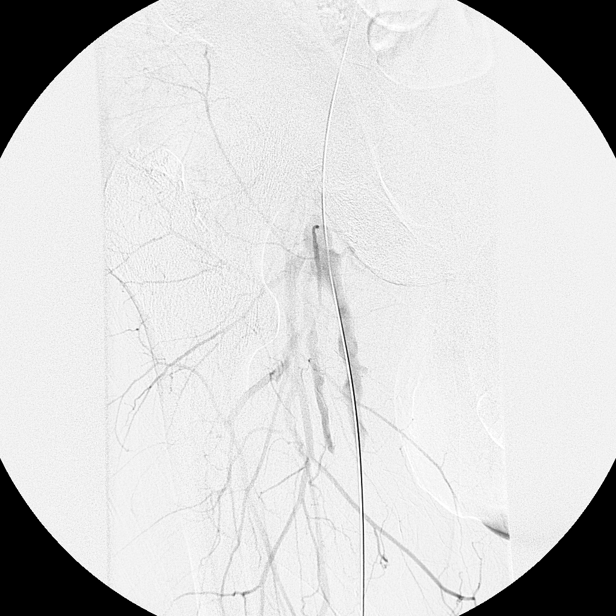

[15 of 24 positions shown; findings below may reference images not displayed]

IMPRESSION: 1.  Decreased clot burden within the femoral-popliteal vein graft and discovery of a graft aneurysm at the level of the knee joint.  
2.  Runoff disease in the foot, unclear if this a chronic abnormality or related to distal clot.
3.  Plan to continue infusion therapy overnight.

## 2008-09-27 IMAGING — XA IR ANGIO/EXISTING CATHETER
1 series · 13 of 24 positions shown · non-contrast
Comparison: none

CLINICAL DATA: 45 year-old-male with occluded right femoral popiteal graft.  Thrombolytic therapy was started on 06/02/06. 
FOLLOW-UP ANGIOGRAM FOR THROMBOLYSIS ? 06/04/06: 
Physician:   Dr. J Michael Brenneman. 
Procedure:  The existing left groin catheter was prepped and draped in a sterile fashion.  The infusion wire was removed from the infusion catheter.   Contrast was injected through the infusion catheter.   The catheter was pulled back and additional angiograms of the right leg were obtained.

[Series 1: run · 13 of 59 slices shown]
[im 1/59]
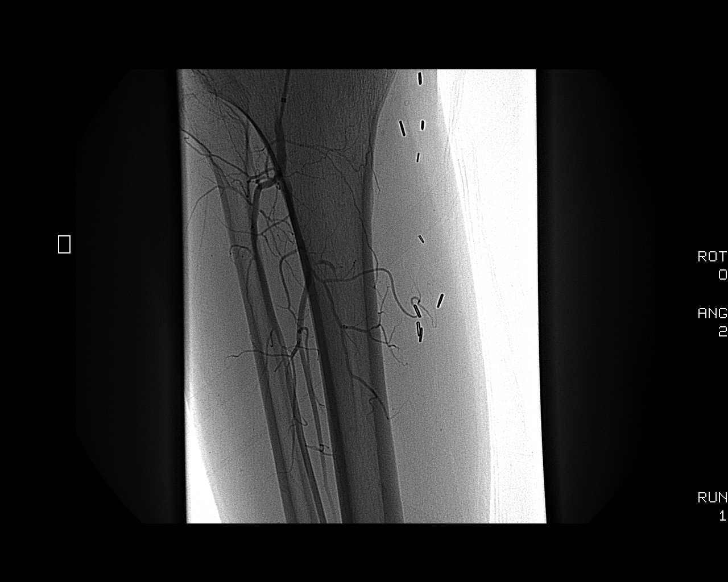
[im 6/59]
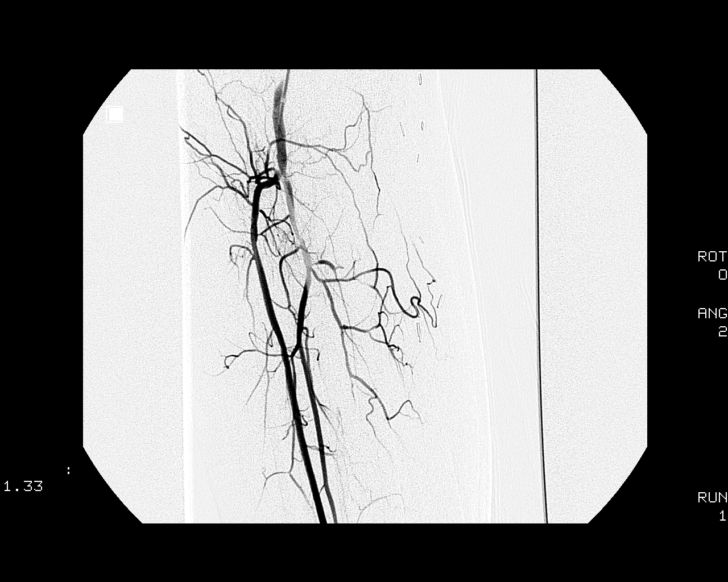
[im 11/59]
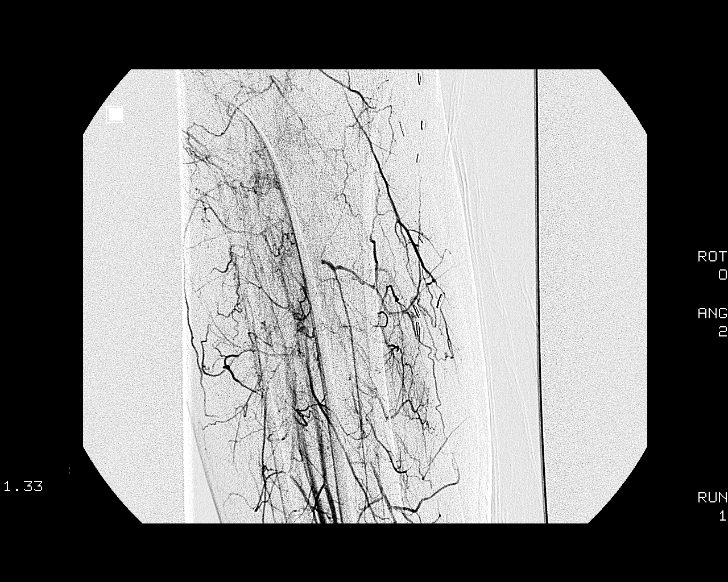
[im 16/59]
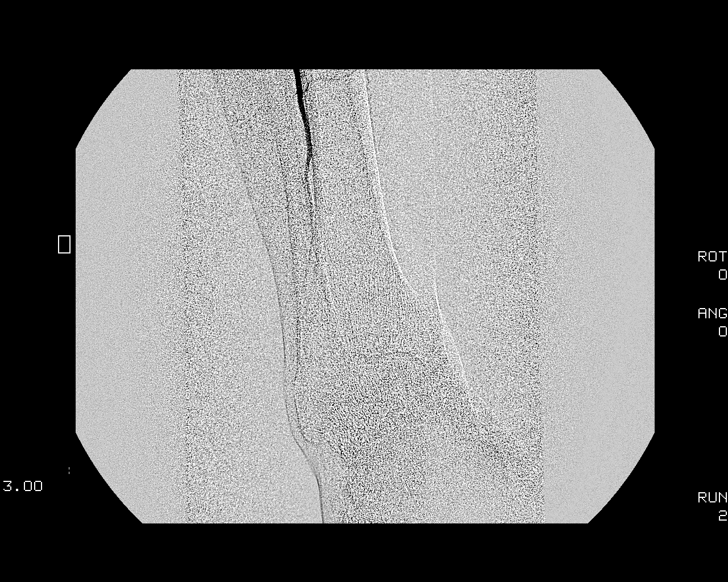
[im 21/59]
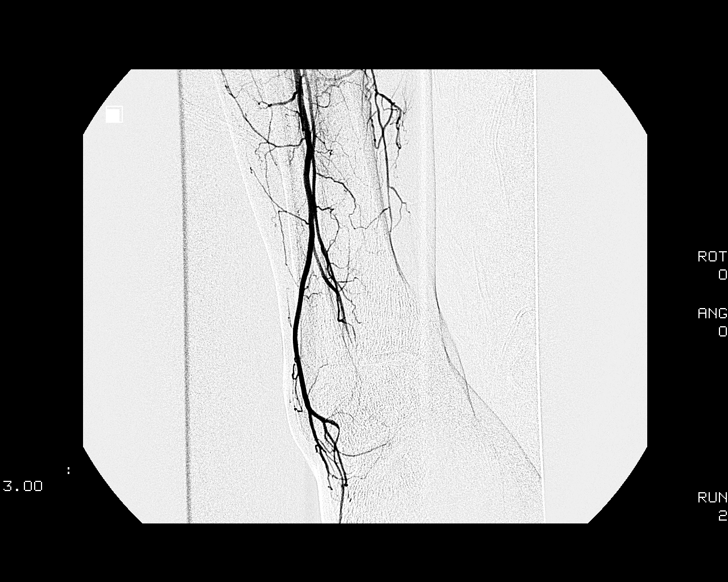
[im 26/59]
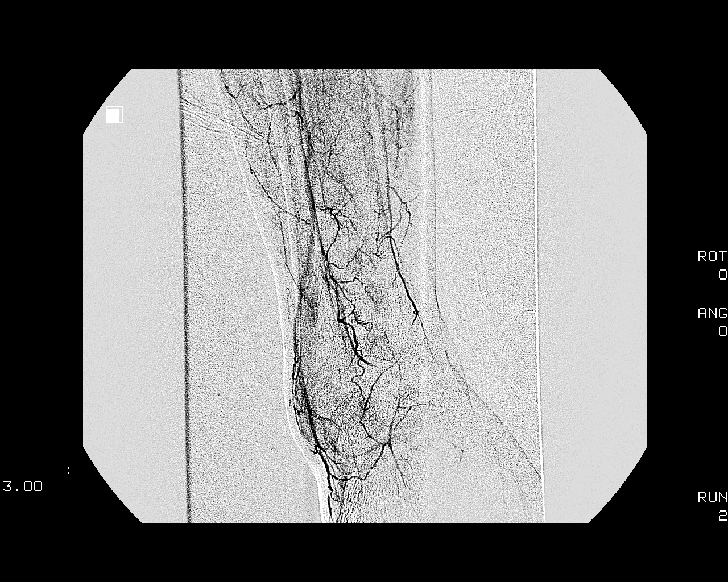
[im 31/59]
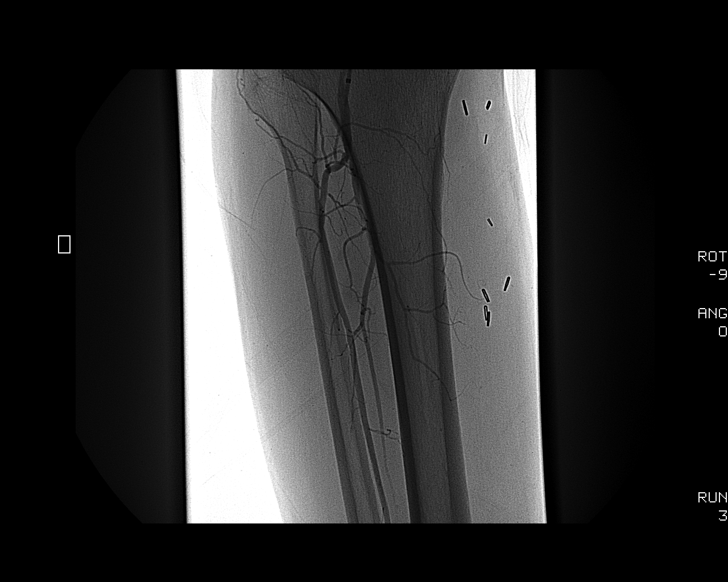
[im 33/59]
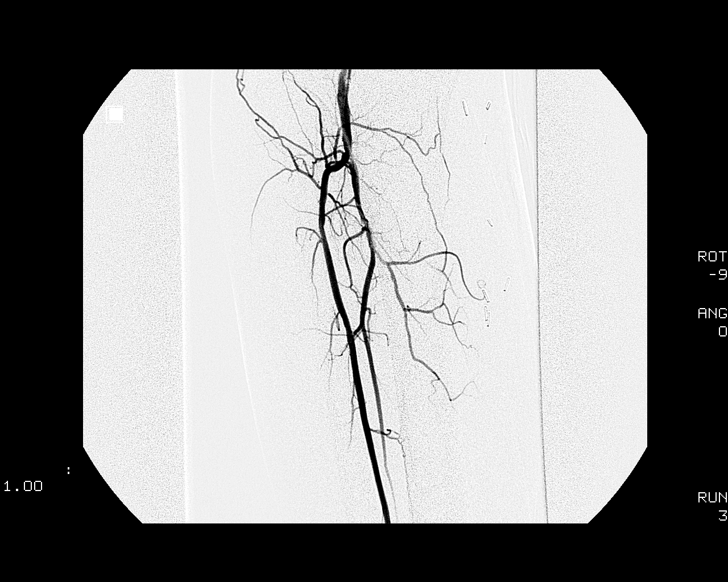
[im 38/59]
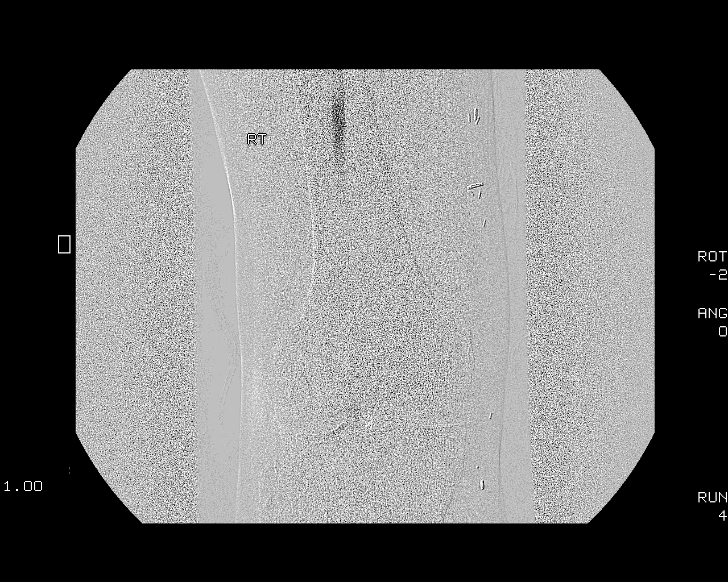
[im 43/59]
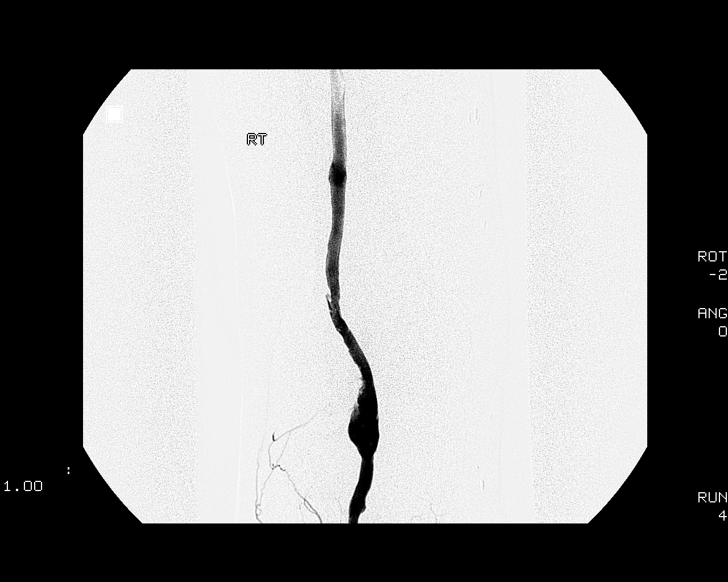
[im 48/59]
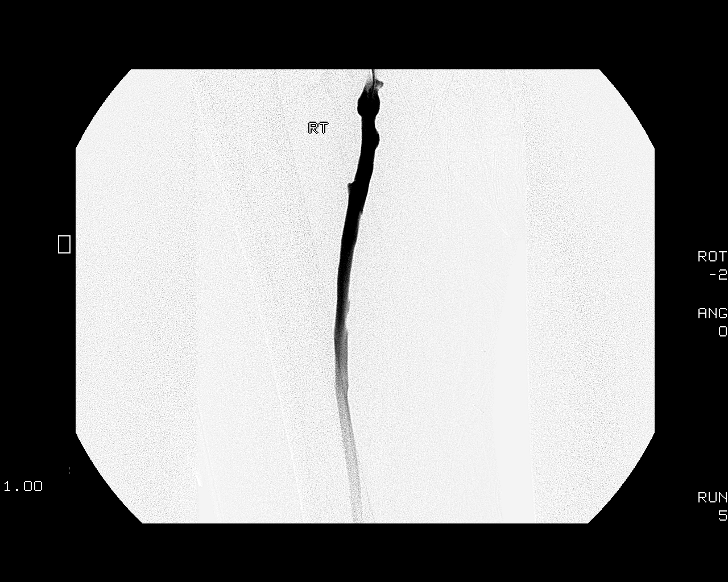
[im 53/59]
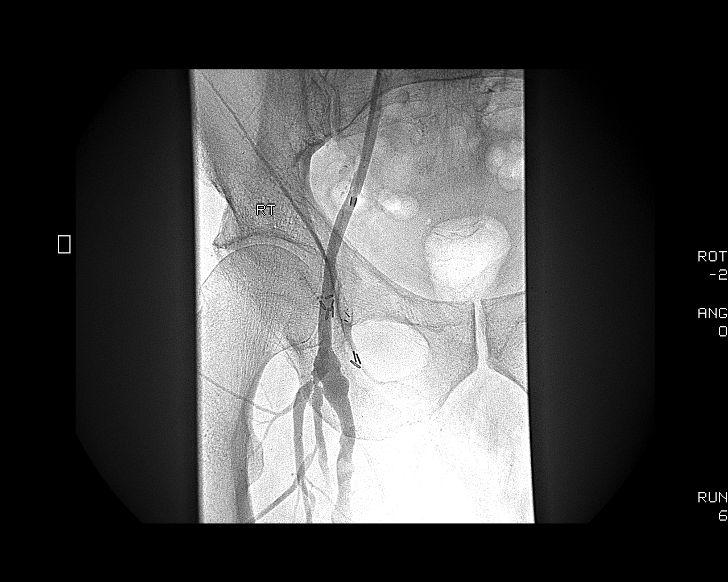
[im 59/59]
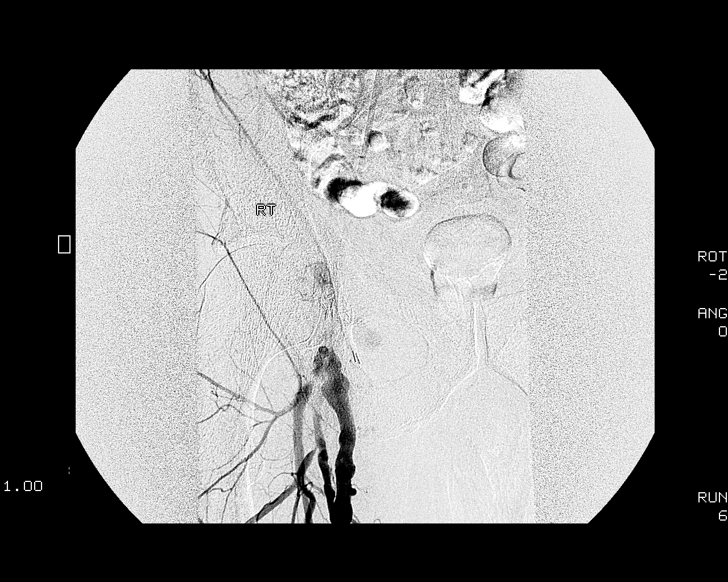

[13 of 24 positions shown; findings below may reference images not displayed]

FINDINGS: There is markedly improved runoff flow.  The distal graft is patent and there is good flow through the anterior tibial artery down to the foot and ankle.  Again seen is an area of narrowing at the origin of the tibioperoneal trunk but there is flow in the peroneal artery.  Noted is an aneurysm of the graft at the level of the knee.  There is a small area of residual narrowing in the graft just above the aneurysm. This is not thought to be a critical lesion.  The rest of the graft was widely patent and injection through the sheath demonstrated good proximal flow in the graft as well. The findings were discussed with the referring physician, Dr. Angelena Olivia.   The thrombolytic infusion and heparin were stopped prior to the angiograms.  The groin catheter was removed when the ACT was below 180.  The patient tolerated the procedure well without immediate complication.
IMPRESSION: 1.  Re-establishment of flow throughout the right femoral-popliteal vein graft.  
2.   Aneurysm formation in the graft at the level of the knee joint. 
3.  Markedly improved runoff flow which is predominantly from the anterior tibial artery.  There is residual disease near the origin of the tibioperoneal trunk.

## 2008-10-03 ENCOUNTER — Telehealth: Payer: Self-pay | Admitting: *Deleted

## 2008-10-04 ENCOUNTER — Telehealth: Payer: Self-pay | Admitting: *Deleted

## 2008-10-05 ENCOUNTER — Encounter: Payer: Self-pay | Admitting: Internal Medicine

## 2008-10-11 ENCOUNTER — Telehealth: Payer: Self-pay | Admitting: *Deleted

## 2008-10-12 ENCOUNTER — Ambulatory Visit: Payer: Self-pay | Admitting: Vascular Surgery

## 2008-10-15 ENCOUNTER — Ambulatory Visit: Payer: Self-pay | Admitting: Internal Medicine

## 2008-10-15 DIAGNOSIS — F17200 Nicotine dependence, unspecified, uncomplicated: Secondary | ICD-10-CM

## 2008-10-16 ENCOUNTER — Encounter: Payer: Self-pay | Admitting: Internal Medicine

## 2008-10-16 ENCOUNTER — Ambulatory Visit: Payer: Self-pay | Admitting: Internal Medicine

## 2008-10-17 ENCOUNTER — Telehealth: Payer: Self-pay | Admitting: Licensed Clinical Social Worker

## 2008-10-18 ENCOUNTER — Telehealth: Payer: Self-pay

## 2008-10-18 LAB — CONVERTED CEMR LAB
ALT: 23 units/L (ref 0–53)
AST: 18 units/L (ref 0–37)
Calcium: 9.5 mg/dL (ref 8.4–10.5)
Chloride: 108 meq/L (ref 96–112)
Creatinine, Ser: 0.97 mg/dL (ref 0.40–1.50)
Sodium: 141 meq/L (ref 135–145)
Total CHOL/HDL Ratio: 4.4
VLDL: 30 mg/dL (ref 0–40)

## 2008-10-25 ENCOUNTER — Telehealth: Payer: Self-pay | Admitting: *Deleted

## 2008-10-31 ENCOUNTER — Telehealth: Payer: Self-pay | Admitting: Internal Medicine

## 2008-11-19 ENCOUNTER — Ambulatory Visit: Payer: Self-pay | Admitting: Internal Medicine

## 2008-11-19 LAB — CONVERTED CEMR LAB
Blood Glucose, Fingerstick: 191
Hgb A1c MFr Bld: 5.9 %

## 2008-12-03 ENCOUNTER — Telehealth: Payer: Self-pay | Admitting: Internal Medicine

## 2008-12-04 ENCOUNTER — Encounter: Payer: Self-pay | Admitting: Internal Medicine

## 2008-12-13 ENCOUNTER — Telehealth: Payer: Self-pay | Admitting: Internal Medicine

## 2008-12-27 ENCOUNTER — Telehealth: Payer: Self-pay | Admitting: Internal Medicine

## 2008-12-28 ENCOUNTER — Ambulatory Visit: Payer: Self-pay | Admitting: Vascular Surgery

## 2009-01-03 ENCOUNTER — Telehealth: Payer: Self-pay | Admitting: Internal Medicine

## 2009-01-08 ENCOUNTER — Telehealth: Payer: Self-pay | Admitting: Internal Medicine

## 2009-01-18 ENCOUNTER — Ambulatory Visit: Payer: Self-pay | Admitting: Internal Medicine

## 2009-01-19 ENCOUNTER — Encounter: Payer: Self-pay | Admitting: Internal Medicine

## 2009-01-19 LAB — CONVERTED CEMR LAB
AST: 26 units/L (ref 0–37)
Alkaline Phosphatase: 64 units/L (ref 39–117)
BUN: 23 mg/dL (ref 6–23)
Creatinine, Ser: 0.91 mg/dL (ref 0.40–1.50)
HDL: 34 mg/dL — ABNORMAL LOW (ref >39)
LDL Cholesterol: 87 mg/dL (ref 0–99)
Potassium: 4.9 meq/L (ref 3.5–5.3)
Total CHOL/HDL Ratio: 5.8

## 2009-01-22 ENCOUNTER — Encounter (INDEPENDENT_AMBULATORY_CARE_PROVIDER_SITE_OTHER): Payer: Self-pay | Admitting: *Deleted

## 2009-01-28 ENCOUNTER — Encounter: Payer: Self-pay | Admitting: Interventional Radiology

## 2009-01-29 ENCOUNTER — Telehealth: Payer: Self-pay | Admitting: Internal Medicine

## 2009-02-03 ENCOUNTER — Emergency Department (HOSPITAL_COMMUNITY): Admission: EM | Admit: 2009-02-03 | Discharge: 2009-02-03 | Payer: Self-pay | Admitting: Emergency Medicine

## 2009-02-03 ENCOUNTER — Encounter: Payer: Self-pay | Admitting: Internal Medicine

## 2009-02-06 ENCOUNTER — Telehealth: Payer: Self-pay | Admitting: Internal Medicine

## 2009-02-06 ENCOUNTER — Telehealth: Payer: Self-pay | Admitting: *Deleted

## 2009-02-06 ENCOUNTER — Encounter
Admission: RE | Admit: 2009-02-06 | Discharge: 2009-02-06 | Payer: Self-pay | Admitting: Physical Medicine & Rehabilitation

## 2009-02-18 ENCOUNTER — Ambulatory Visit: Payer: Self-pay | Admitting: Internal Medicine

## 2009-02-18 ENCOUNTER — Encounter: Payer: Self-pay | Admitting: Internal Medicine

## 2009-02-18 DIAGNOSIS — F1410 Cocaine abuse, uncomplicated: Secondary | ICD-10-CM | POA: Insufficient documentation

## 2009-02-18 LAB — CONVERTED CEMR LAB: Hgb A1c MFr Bld: 6.2 %

## 2009-02-20 ENCOUNTER — Ambulatory Visit: Payer: Self-pay | Admitting: Internal Medicine

## 2009-02-20 ENCOUNTER — Encounter (INDEPENDENT_AMBULATORY_CARE_PROVIDER_SITE_OTHER): Payer: Self-pay | Admitting: *Deleted

## 2009-02-20 ENCOUNTER — Encounter: Payer: Self-pay | Admitting: Internal Medicine

## 2009-02-20 DIAGNOSIS — R12 Heartburn: Secondary | ICD-10-CM

## 2009-02-20 DIAGNOSIS — R079 Chest pain, unspecified: Secondary | ICD-10-CM

## 2009-02-20 LAB — CONVERTED CEMR LAB
Barbiturate Quant, Ur: NEGATIVE
Basophils Absolute: 0.2 10*3/uL — ABNORMAL HIGH (ref 0.0–0.1)
Creatinine,U: 211.3 mg/dL
Eosinophils Absolute: 0.8 10*3/uL — ABNORMAL HIGH (ref 0.0–0.7)
HCT: 45.6 % (ref 39.0–52.0)
Lymphocytes Relative: 40.5 % (ref 12.0–46.0)
Lymphs Abs: 4.1 10*3/uL — ABNORMAL HIGH (ref 0.7–4.0)
Monocytes Relative: 6.6 % (ref 3.0–12.0)
Opiates: NEGATIVE
Platelets: 265 10*3/uL (ref 150.0–400.0)
Propoxyphene: NEGATIVE
RDW: 14.3 % (ref 11.5–14.6)

## 2009-02-22 ENCOUNTER — Telehealth: Payer: Self-pay | Admitting: Internal Medicine

## 2009-03-01 ENCOUNTER — Telehealth: Payer: Self-pay | Admitting: *Deleted

## 2009-03-04 ENCOUNTER — Telehealth: Payer: Self-pay | Admitting: Internal Medicine

## 2009-03-07 ENCOUNTER — Telehealth: Payer: Self-pay | Admitting: *Deleted

## 2009-03-07 ENCOUNTER — Telehealth: Payer: Self-pay | Admitting: Internal Medicine

## 2009-03-27 ENCOUNTER — Telehealth: Payer: Self-pay | Admitting: *Deleted

## 2009-04-02 ENCOUNTER — Telehealth: Payer: Self-pay | Admitting: Internal Medicine

## 2009-04-04 ENCOUNTER — Ambulatory Visit (HOSPITAL_COMMUNITY): Admission: RE | Admit: 2009-04-04 | Discharge: 2009-04-04 | Payer: Self-pay | Admitting: Interventional Radiology

## 2009-04-05 ENCOUNTER — Ambulatory Visit: Payer: Self-pay | Admitting: Internal Medicine

## 2009-04-05 ENCOUNTER — Ambulatory Visit (HOSPITAL_COMMUNITY): Admission: RE | Admit: 2009-04-05 | Discharge: 2009-04-05 | Payer: Self-pay | Admitting: Internal Medicine

## 2009-04-05 DIAGNOSIS — R509 Fever, unspecified: Secondary | ICD-10-CM

## 2009-04-05 DIAGNOSIS — R059 Cough, unspecified: Secondary | ICD-10-CM | POA: Insufficient documentation

## 2009-04-05 DIAGNOSIS — R05 Cough: Secondary | ICD-10-CM

## 2009-04-05 LAB — CONVERTED CEMR LAB: Blood Glucose, Fingerstick: 103

## 2009-04-11 ENCOUNTER — Encounter: Payer: Self-pay | Admitting: Internal Medicine

## 2009-04-24 ENCOUNTER — Ambulatory Visit (HOSPITAL_COMMUNITY): Admission: RE | Admit: 2009-04-24 | Discharge: 2009-04-24 | Payer: Self-pay | Admitting: Interventional Radiology

## 2009-04-30 ENCOUNTER — Telehealth: Payer: Self-pay | Admitting: Internal Medicine

## 2009-05-01 ENCOUNTER — Encounter: Payer: Self-pay | Admitting: Internal Medicine

## 2009-05-01 LAB — HM DIABETES EYE EXAM

## 2009-05-22 ENCOUNTER — Ambulatory Visit: Payer: Self-pay | Admitting: Internal Medicine

## 2009-05-22 ENCOUNTER — Ambulatory Visit (HOSPITAL_COMMUNITY): Admission: RE | Admit: 2009-05-22 | Discharge: 2009-05-22 | Payer: Self-pay | Admitting: Interventional Radiology

## 2009-05-23 ENCOUNTER — Telehealth: Payer: Self-pay | Admitting: Internal Medicine

## 2009-05-27 ENCOUNTER — Ambulatory Visit: Payer: Self-pay | Admitting: Internal Medicine

## 2009-05-27 ENCOUNTER — Telehealth: Payer: Self-pay | Admitting: Internal Medicine

## 2009-05-27 ENCOUNTER — Encounter: Payer: Self-pay | Admitting: Interventional Radiology

## 2009-05-27 ENCOUNTER — Inpatient Hospital Stay (HOSPITAL_COMMUNITY): Admission: EM | Admit: 2009-05-27 | Discharge: 2009-05-31 | Payer: Self-pay | Admitting: Emergency Medicine

## 2009-05-27 ENCOUNTER — Encounter: Payer: Self-pay | Admitting: Internal Medicine

## 2009-05-28 LAB — CONVERTED CEMR LAB
Alkaline Phosphatase: 72 units/L (ref 39–117)
Creatinine, Ser: 0.96 mg/dL (ref 0.40–1.50)
Glucose, Bld: 133 mg/dL — ABNORMAL HIGH (ref 70–99)
HDL: 40 mg/dL (ref >39)
LDL Cholesterol: 104 mg/dL — ABNORMAL HIGH (ref 0–99)
Sodium: 139 meq/L (ref 135–145)
Total Bilirubin: 0.5 mg/dL (ref 0.3–1.2)
Total CHOL/HDL Ratio: 4.5
Total Protein: 7.5 g/dL (ref 6.0–8.3)
Triglycerides: 169 mg/dL — ABNORMAL HIGH (ref <150)
VLDL: 34 mg/dL (ref 0–40)

## 2009-05-30 ENCOUNTER — Telehealth: Payer: Self-pay | Admitting: Internal Medicine

## 2009-05-31 ENCOUNTER — Telehealth: Payer: Self-pay | Admitting: Internal Medicine

## 2009-05-31 ENCOUNTER — Encounter: Payer: Self-pay | Admitting: Internal Medicine

## 2009-06-04 ENCOUNTER — Telehealth: Payer: Self-pay | Admitting: Licensed Clinical Social Worker

## 2009-06-05 ENCOUNTER — Telehealth: Payer: Self-pay | Admitting: Internal Medicine

## 2009-06-11 ENCOUNTER — Telehealth: Payer: Self-pay | Admitting: Internal Medicine

## 2009-06-12 ENCOUNTER — Ambulatory Visit: Payer: Self-pay | Admitting: Internal Medicine

## 2009-06-12 DIAGNOSIS — S82409B Unspecified fracture of shaft of unspecified fibula, initial encounter for open fracture type I or II: Secondary | ICD-10-CM

## 2009-06-12 DIAGNOSIS — S82209B Unspecified fracture of shaft of unspecified tibia, initial encounter for open fracture type I or II: Secondary | ICD-10-CM

## 2009-06-17 ENCOUNTER — Telehealth: Payer: Self-pay | Admitting: *Deleted

## 2009-07-08 ENCOUNTER — Telehealth: Payer: Self-pay | Admitting: Internal Medicine

## 2009-07-17 ENCOUNTER — Ambulatory Visit: Payer: Self-pay | Admitting: Internal Medicine

## 2009-07-23 ENCOUNTER — Ambulatory Visit: Payer: Self-pay | Admitting: Vascular Surgery

## 2009-07-24 ENCOUNTER — Ambulatory Visit: Payer: Self-pay | Admitting: Surgery

## 2009-07-24 ENCOUNTER — Ambulatory Visit (HOSPITAL_COMMUNITY): Admission: RE | Admit: 2009-07-24 | Discharge: 2009-07-24 | Payer: Self-pay | Admitting: Surgery

## 2009-07-25 ENCOUNTER — Encounter: Payer: Self-pay | Admitting: Internal Medicine

## 2009-08-02 ENCOUNTER — Ambulatory Visit: Payer: Self-pay | Admitting: Vascular Surgery

## 2009-08-02 ENCOUNTER — Encounter: Payer: Self-pay | Admitting: Internal Medicine

## 2009-08-08 ENCOUNTER — Telehealth: Payer: Self-pay | Admitting: Internal Medicine

## 2009-08-12 ENCOUNTER — Encounter: Payer: Self-pay | Admitting: Internal Medicine

## 2009-08-14 ENCOUNTER — Encounter: Payer: Self-pay | Admitting: Internal Medicine

## 2009-08-15 ENCOUNTER — Ambulatory Visit: Payer: Self-pay | Admitting: Vascular Surgery

## 2009-08-20 ENCOUNTER — Encounter: Payer: Self-pay | Admitting: *Deleted

## 2009-08-20 ENCOUNTER — Telehealth: Payer: Self-pay | Admitting: *Deleted

## 2009-08-23 DIAGNOSIS — L97809 Non-pressure chronic ulcer of other part of unspecified lower leg with unspecified severity: Secondary | ICD-10-CM | POA: Insufficient documentation

## 2009-08-30 ENCOUNTER — Telehealth: Payer: Self-pay | Admitting: Internal Medicine

## 2009-09-02 ENCOUNTER — Encounter: Payer: Self-pay | Admitting: Internal Medicine

## 2009-09-02 ENCOUNTER — Telehealth: Payer: Self-pay | Admitting: Internal Medicine

## 2009-09-05 ENCOUNTER — Telehealth: Payer: Self-pay | Admitting: Internal Medicine

## 2009-09-09 ENCOUNTER — Ambulatory Visit: Payer: Self-pay | Admitting: Internal Medicine

## 2009-09-09 ENCOUNTER — Telehealth: Payer: Self-pay | Admitting: Internal Medicine

## 2009-09-09 LAB — CONVERTED CEMR LAB: Blood Glucose, Fingerstick: 191

## 2009-09-10 ENCOUNTER — Encounter: Payer: Self-pay | Admitting: Internal Medicine

## 2009-09-10 ENCOUNTER — Inpatient Hospital Stay (HOSPITAL_COMMUNITY): Admission: RE | Admit: 2009-09-10 | Discharge: 2009-09-13 | Payer: Self-pay | Admitting: Orthopedic Surgery

## 2009-09-11 ENCOUNTER — Encounter
Admission: RE | Admit: 2009-09-11 | Discharge: 2009-12-10 | Payer: Self-pay | Admitting: Physical Medicine & Rehabilitation

## 2009-10-07 ENCOUNTER — Encounter: Payer: Self-pay | Admitting: Internal Medicine

## 2009-10-16 ENCOUNTER — Encounter: Payer: Self-pay | Admitting: Internal Medicine

## 2009-10-17 ENCOUNTER — Encounter: Payer: Self-pay | Admitting: Internal Medicine

## 2009-10-21 ENCOUNTER — Encounter: Payer: Self-pay | Admitting: Internal Medicine

## 2009-10-24 ENCOUNTER — Ambulatory Visit: Payer: Self-pay | Admitting: Vascular Surgery

## 2009-10-28 ENCOUNTER — Inpatient Hospital Stay (HOSPITAL_COMMUNITY): Admission: AD | Admit: 2009-10-28 | Discharge: 2009-10-31 | Payer: Self-pay | Admitting: Orthopedic Surgery

## 2009-10-28 ENCOUNTER — Other Ambulatory Visit: Payer: Self-pay | Admitting: Orthopedic Surgery

## 2009-10-29 ENCOUNTER — Telehealth: Payer: Self-pay | Admitting: Licensed Clinical Social Worker

## 2009-11-11 ENCOUNTER — Encounter: Payer: Self-pay | Admitting: Internal Medicine

## 2009-11-15 ENCOUNTER — Telehealth: Payer: Self-pay | Admitting: Internal Medicine

## 2009-11-20 ENCOUNTER — Telehealth: Payer: Self-pay | Admitting: Internal Medicine

## 2009-12-09 ENCOUNTER — Encounter: Payer: Self-pay | Admitting: Internal Medicine

## 2009-12-12 ENCOUNTER — Ambulatory Visit (HOSPITAL_COMMUNITY)
Admission: RE | Admit: 2009-12-12 | Discharge: 2009-12-12 | Payer: Self-pay | Source: Home / Self Care | Admitting: Orthopedic Surgery

## 2009-12-12 ENCOUNTER — Telehealth: Payer: Self-pay | Admitting: Internal Medicine

## 2009-12-16 ENCOUNTER — Encounter: Payer: Self-pay | Admitting: Internal Medicine

## 2009-12-28 IMAGING — CR DG CHEST 1V PORT
2 series · 2 of 2 positions shown · non-contrast
Comparison: Chest radiograph 06/02/2006

CLINICAL DATA: Chest pain pain on left side

PORTABLE CHEST - 1 VIEW

[AP (1 of 2)]
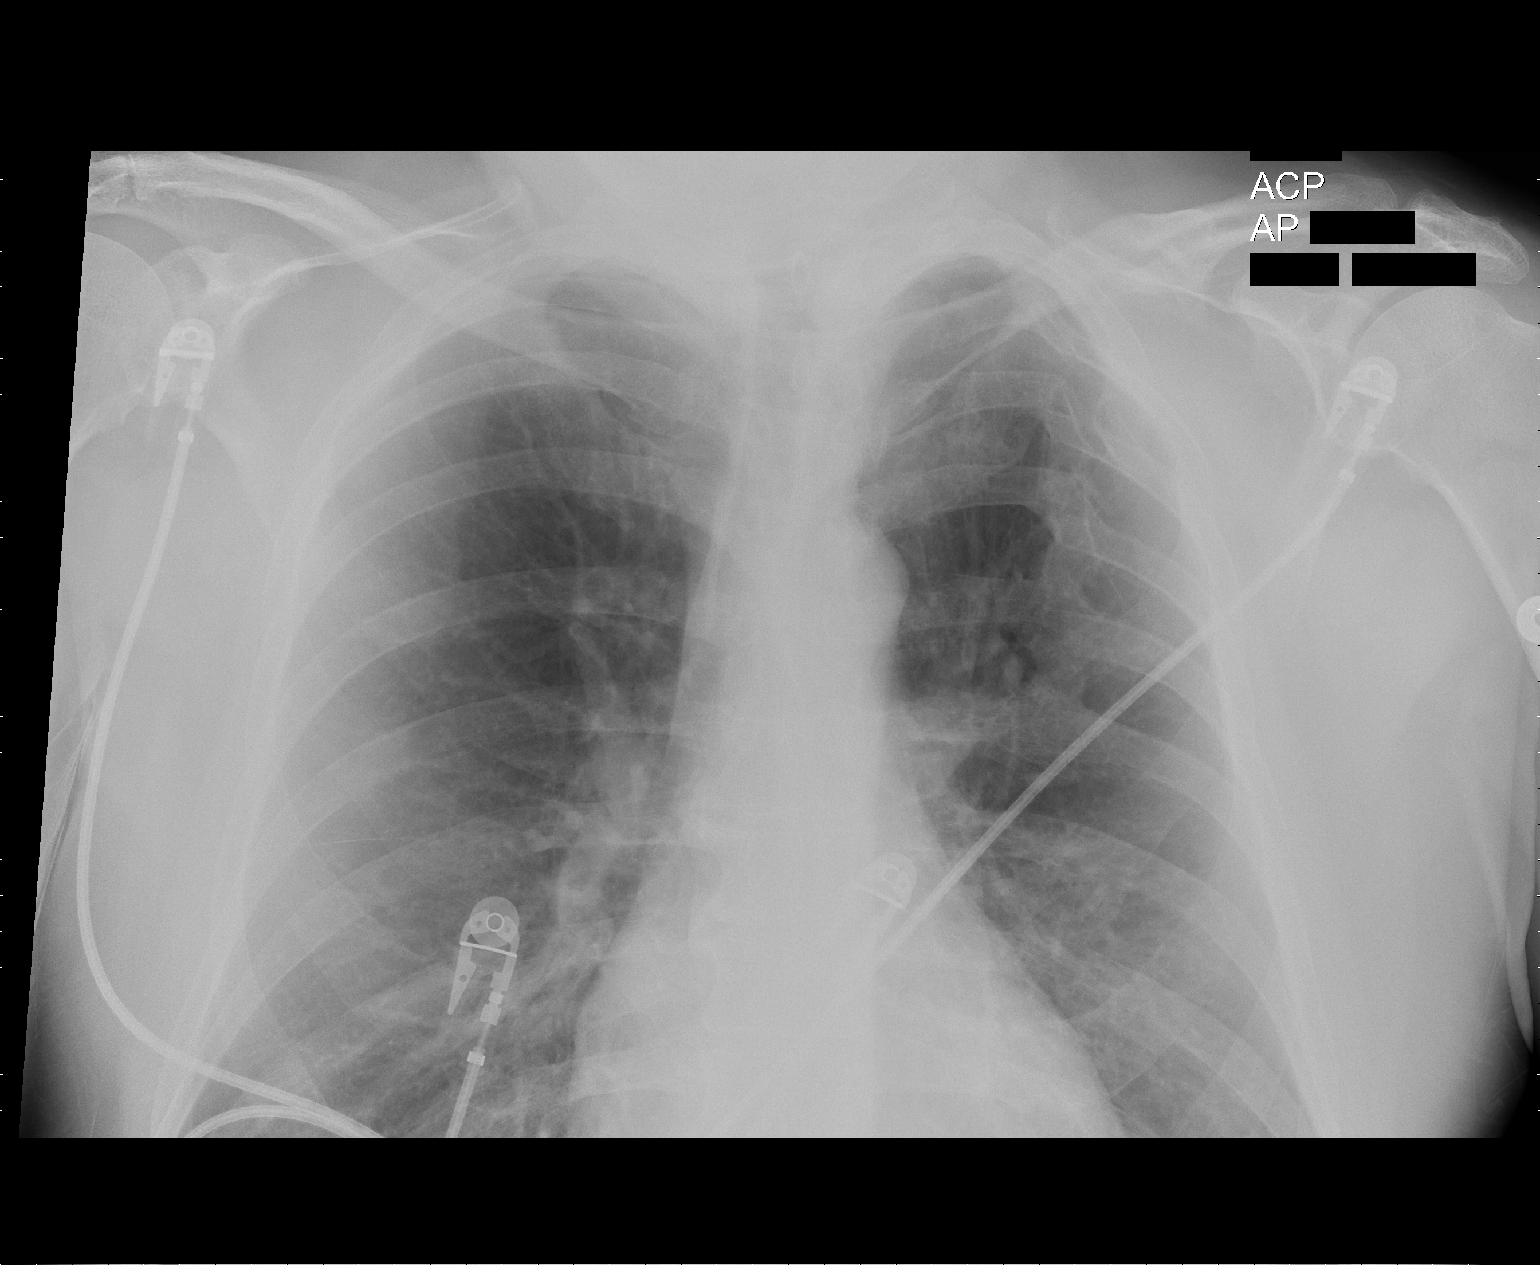

[AP (2 of 2)]
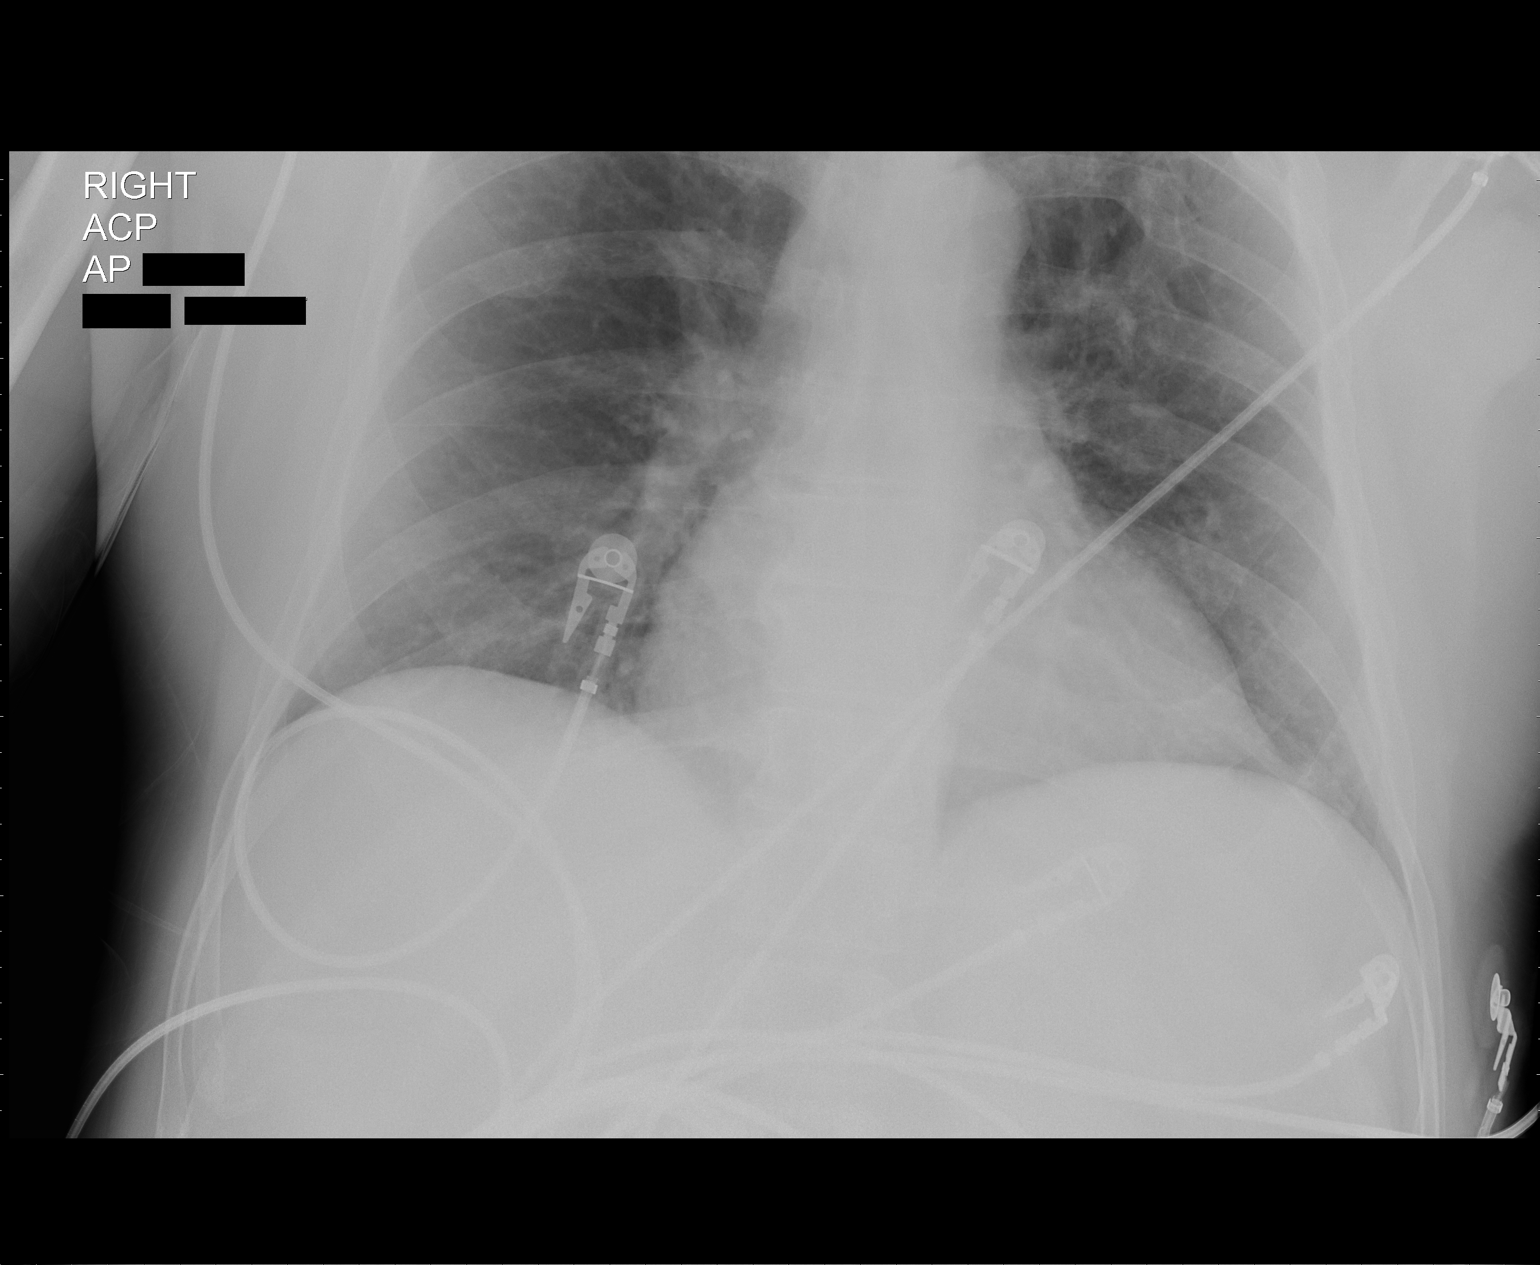

[2 of 2 positions shown; findings below may reference images not displayed]

FINDINGS: Normal mediastinum and cardiac silhouette.  The
costophrenic angles are clear.  There is central bronchitic change.
Multiple old rib fractures along the posterior left upper ribs.
Additional clavicle fracture.
IMPRESSION: 1..  No acute cardiopulmonary process.
2..  Central bronchitic change.
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
## 2010-01-08 ENCOUNTER — Telehealth (INDEPENDENT_AMBULATORY_CARE_PROVIDER_SITE_OTHER): Payer: Self-pay | Admitting: *Deleted

## 2010-01-10 ENCOUNTER — Ambulatory Visit: Payer: Self-pay | Admitting: Vascular Surgery

## 2010-01-14 ENCOUNTER — Ambulatory Visit: Admit: 2010-01-14 | Payer: Self-pay | Admitting: Physical Medicine & Rehabilitation

## 2010-01-14 ENCOUNTER — Telehealth: Payer: Self-pay | Admitting: *Deleted

## 2010-02-02 ENCOUNTER — Encounter: Payer: Self-pay | Admitting: Interventional Radiology

## 2010-02-06 ENCOUNTER — Telehealth: Payer: Self-pay | Admitting: *Deleted

## 2010-02-11 NOTE — Miscellaneous (Signed)
Summary: [facility]  [facility]   Imported By: Shon Hough 10/18/2009 12:40:12  _____________________________________________________________________  External Attachment:    Type:   Image     Comment:   External Document

## 2010-02-11 NOTE — Progress Notes (Signed)
Summary: refill/ hla  Phone Note Refill Request Message from:  Patient on March 27, 2009 2:18 PM  Refills Requested: Medication #1:  NORCO 10-325 MG TABS take 1 tablet every 6 hours as needed for pain.   Last Refilled: 2/21 Initial call taken by: Marin Roberts RN,  March 27, 2009 2:18 PM  Follow-up for Phone Call        Refill approved-nurse to complete Follow-up by: Mariea Stable MD,  April 01, 2009 9:41 AM  Additional Follow-up for Phone Call Additional follow up Details #1::        Rx called to pharmacy Additional Follow-up by: Marin Roberts RN,  April 01, 2009 2:23 PM    Prescriptions: NORCO 10-325 MG TABS (HYDROCODONE-ACETAMINOPHEN) take 1 tablet every 6 hours as needed for pain.  #120 x 0   Entered and Authorized by:   Mariea Stable MD   Signed by:   Mariea Stable MD on 04/01/2009   Method used:   Telephoned to ...       [facility] (retail)       [address] [place], [place]  [address]       Ph: [phone]       Fax: [phone]   RxID:   [id number]
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
## 2010-02-11 NOTE — Progress Notes (Signed)
  Phone Note Outgoing Call   Call placed by: Theotis Barrio NT II,  March 01, 2009 2:47 PM Call placed to: Patient Details for Reason: [facility] Summary of Call: CALLED PATIENT AND LEFT VOICE MESSAGE FOR THE PATIENT TO CALL THE [facility] WHEN HE GETS THE MESSAGE FOR APPT WITH [facility].Lemon Whitacre NT II  March 01, 2009 2:48 PM
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
## 2010-02-11 NOTE — Progress Notes (Signed)
Summary: appts outside of clinic/ hla  Phone Note Call from Patient   Summary of Call: pt called c/o that everyone had dumped him and he had no more appts w/ dr Shelle Iron and dr Noelle Penner. he was asked to call the offices and ask if he was to have another appt with either...he states "they will get mad at me" i called dr beane's office, appt 6/10 @ 0900, pt then says that dr Noelle Penner was to refer him to [facility] for surgery, i called dr fender's office and it was stated that a referral was made to [facility] and that dr fender's office would cll pt 6/7. informed pt of this and dr Shelle Iron appt, he thanked me and hung up Initial call taken by: Marin Roberts RN,  June 18, 2009 8:53 AM

## 2010-02-11 NOTE — Consult Note (Signed)
Summary: ORTHOPAEDIC TRAUMA SPECIALIST  ORTHOPAEDIC TRAUMA SPECIALIST   Imported By: Margie Billet 10/18/2009 09:42:56  _____________________________________________________________________  External Attachment:    Type:   Image     Comment:   External Document

## 2010-02-11 NOTE — Progress Notes (Signed)
Summary: med refill/gp  Phone Note Refill Request Message from:  Fax from Pharmacy on February 22, 2009 3:47 PM  Refills Requested: Medication #1:  ZANTAC 150 MG TABS Take 1 tablet by mouth two times a day   Last Refilled: 01/18/2009  Method Requested: Telephone to Pharmacy Initial call taken by: Chinita Pester RN,  February 22, 2009 3:47 PM  Follow-up for Phone Call        Rx faxed to pharmacy Follow-up by: Mariea Stable MD,  February 25, 2009 6:22 AM    Prescriptions: ZANTAC 150 MG TABS (RANITIDINE HCL) Take 1 tablet by mouth two times a day  #60 x 6   Entered and Authorized by:   Mariea Stable MD   Signed by:   Mariea Stable MD on 02/25/2009   Method used:   Faxed to ...       [facility] (retail)       [address] [place], [place]  [address]       Ph: [phone]       Fax: [phone]   RxID:   [id number]

## 2010-02-11 NOTE — Progress Notes (Signed)
Summary: Refill/gh  Phone Note Refill Request Message from:  Fax from Pharmacy on September 09, 2009 11:03 AM  Refills Requested: Medication #1:  TRICOR 48 MG TABS Take 1 tablet by mouth once a day   Dosage confirmed as above?Dosage Confirmed   Supply Requested: 3 months   Last Refilled: 06/03/2009  Method Requested: Fax to Local Pharmacy Initial call taken by: Angelina Ok RN,  September 09, 2009 11:04 AM    Prescriptions: TRICOR 48 MG TABS (FENOFIBRATE) Take 1 tablet by mouth once a day  #90 x 3   Entered and Authorized by:   Doneen Poisson MD   Signed by:   Doneen Poisson MD on 09/10/2009   Method used:   Faxed to ...       [facility] Department (retail)       [address] [place], [place]  [address]       Ph: [phone]       Fax: [phone]   RxID:   [id number]

## 2010-02-11 NOTE — Progress Notes (Signed)
Summary: refill/gg  Phone Note Refill Request  on September 02, 2009 5:06 PM  wants Rx for cream  for itching on legs.  Triamcinolone Cream??   Method Requested: Fax to Local Pharmacy Initial call taken by: Merrie Roof RN,  September 02, 2009 5:06 PM  Follow-up for Phone Call        Pt already has vistaril on med list though given some time back.  If he does not have any more, he can buy OTC benadryl for itching realizing this can cause sedation.  Furthermore, he can use moisturizing cream.  Will not prescribe steroid cream without knowing etiology of itching (may just be from dressings?)  Please let pt know.   Thanks, Follow-up by: Mariea Stable MD,  September 03, 2009 10:01 AM  Additional Follow-up for Phone Call Additional follow up Details #1::        Pt informed and will talk to you next week at appointment Additional Follow-up by: Merrie Roof RN,  September 05, 2009 3:26 PM

## 2010-02-11 NOTE — Progress Notes (Signed)
Summary: refill/gg  Phone Note Refill Request  on April 02, 2009 2:40 PM  Refills Requested: Medication #1:  VIAGRA 100 MG TABS take 1 tablet at least 30 minutes before intercourse.  Do not take more than 1 tablet in a 24 hour period.   Last Refilled: 03/05/2009  Method Requested: Fax to Local Pharmacy Initial call taken by: Merrie Roof RN,  April 02, 2009 2:42 PM  Follow-up for Phone Call        Refill approved-nurse to complete Follow-up by: Mariea Stable MD,  April 03, 2009 6:32 PM  Additional Follow-up for Phone Call Additional follow up Details #1::        Rx faxed to pharmacy Additional Follow-up by: Merrie Roof RN,  April 05, 2009 10:34 AM    Prescriptions: VIAGRA 100 MG TABS (SILDENAFIL CITRATE) take 1 tablet at least 30 minutes before intercourse.  Do not take more than 1 tablet in a 24 hour period.  #30 x 0   Entered and Authorized by:   Mariea Stable MD   Signed by:   Mariea Stable MD on 04/03/2009   Method used:   Telephoned to ...       [facility] Department (retail)       [address] [place], [place]  [address]       Ph: [phone]       Fax: [phone]   RxID:   [id number]

## 2010-02-11 NOTE — Miscellaneous (Signed)
Summary: ED visit - left AMA  Patient came to the ED complaining of chest pain, palpitation and SOB for 3 days started after drinking Coca Cola. Releived by lying in bed for a while. Felt it again this morning while drinking coffee and decided to come to the ED. Labs in the ED showed negative POC cardiac enzymes, no new EKG changes and UDS positive for cocaine. ED physician asked [place] to admit the patient as he felt he had new EKG changes in limb leads. Patient was completely asymptomatic when we saw him after he got analgesic and breathing treatment in the ED. Never recieved or take NTG for the pain. Asked patient to stay overnight in the hosptial for CP rule out but patient left AMA.

## 2010-02-11 NOTE — Progress Notes (Signed)
Summary: Refill/gh  Phone Note Refill Request Message from:  Fax from Pharmacy on August 08, 2009 3:12 PM  Refills Requested: Medication #1:  PROTONIX 40 MG TBEC Take 1 tablet by mouth once a day   Last Refilled: 07/12/2009  Method Requested: Fax to Local Pharmacy Initial call taken by: Angelina Ok RN,  August 08, 2009 3:12 PM  Follow-up for Phone Call        Rx faxed to pharmacy Follow-up by: Mariea Stable MD,  August 12, 2009 9:03 AM    Prescriptions: PROTONIX 40 MG TBEC (PANTOPRAZOLE SODIUM) Take 1 tablet by mouth once a day  #30 x 3   Entered and Authorized by:   Mariea Stable MD   Signed by:   Mariea Stable MD on 08/12/2009   Method used:   Faxed to ...       [facility] Department (retail)       [address] [place], [place]  [address]       Ph: [phone]       Fax: [phone]   RxID:   [id number]
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
## 2010-02-11 NOTE — Progress Notes (Signed)
Summary: Refill/gh  Phone Note Refill Request Message from:  Patient on March 04, 2009 9:16 AM  Refills Requested: Medication #1:  NORCO 10-325 MG TABS take 1 tablet every 6 hours as needed for pain.   Last Refilled: 02/01/2009 Last refill was 02/01/2009 at the [facility].  Lt got 120 tablets.   Method Requested: Electronic Initial call taken by: Angelina Ok RN,  March 04, 2009 9:16 AM  Follow-up for Phone Call        Refill approved-nurse to complete. Follow-up by: Mariea Stable MD,  March 04, 2009 10:46 AM  Additional Follow-up for Phone Call Additional follow up Details #1::        Rx called to pharmacy Additional Follow-up by: Angelina Ok RN,  March 04, 2009 2:19 PM    Prescriptions: NORCO 10-325 MG TABS (HYDROCODONE-ACETAMINOPHEN) take 1 tablet every 6 hours as needed for pain.  #120 x 0   Entered and Authorized by:   Mariea Stable MD   Signed by:   Mariea Stable MD on 03/04/2009   Method used:   Telephoned to ...       [facility] Department (retail)       [address] [place], [place]  [address]       Ph: [phone]       Fax: [phone]   RxID:   [id number]

## 2010-02-11 NOTE — Miscellaneous (Signed)
Summary: ED Pain Care Plan  Clinical Lists Changes    Patient's name and MRN given to ED for designation of "Pain Care Plan" based on physician note in advanced directive screen and office notes. Kacelyn Rowzee RN  August 20, 2009 6:35 PM  

## 2010-02-11 NOTE — Assessment & Plan Note (Signed)
Summary: CHECKUP/SB.   Vital Signs:  Patient profile:   50 year old male Height:      73 inches (185.42 cm) Weight:      193.4 pounds (87.91 kg) BMI:     25.61 Temp:     96.9 degrees F (36.06 degrees C) oral Pulse rate:   73 / minute BP sitting:   138 / 82  Vitals Entered By: Chinita Pester RN (April 05, 2009 1:52 PM) CC: Check-up.  Surgery cancelled yesterday b/c fever; had been coughing up green phlegm but white now.  Needs to be clear for the surgery. Is Patient Diabetic? Yes Did you bring your meter with you today? No Pain Assessment Patient in pain? no      Nutritional Status BMI of 25 - 29 = overweight CBG Result 103  Have you ever been in a relationship where you felt threatened, hurt or afraid?No   Does patient need assistance? Functional Status Self care Ambulation Normal   Diabetic Foot Exam Last Podiatry Exam Date: 04/05/2009  Foot Inspection Is there a history of a foot ulcer?              No Is there a foot ulcer now?              No Can the patient see the bottom of their feet?          Yes Are the shoes appropriate in style and fit?          No Is there swelling or an abnormal foot shape?          No Are the toenails long?                No Are the toenails thick?                No Are the toenails ingrown?              No Is there heavy callous build-up?              No  Diabetic Foot Care Education Patient educated on appropriate care of diabetic feet.  Pulse Check          Right Foot          Left Foot Dorsalis Pedis:        diminished            diminished Comments: Callous noted on great toes - both feet.   10-g (5.07) Semmes-Weinstein Monofilament Test Performed by: Chinita Pester RN          Right Foot          Left Foot Visual Inspection     normal         normal Test Control      normal         normal Site 1         normal         normal Site 2         normal         normal Site 3         normal         normal Site 4         normal          normal Site 5         normal         normal Site 6  normal         normal Site 7         normal         normal Site 8         normal         normal Site 9         normal         normal   Primary Care Provider:  Mariea Stable, MD  CC:  Check-up.  Surgery cancelled yesterday b/c fever; had been coughing up green phlegm but white now.  Needs to be clear for the surgery.Marland Kitchen  History of Present Illness:  50 year old Past Medical History: PVD (followed by Dr. Tawanna Cooler Early until 12/24/2006).      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           07/30/08:  repair of aneurysm by Dr. 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 Pearlean Brownie (10/2008 follow up)      Hep C  DM type 2 HTN HLD Tobacco use ANA positive, titer 1:160 (1999) Erectile dysfunction   He was suppose to have surgery but it was cancel because he had a fever, night sweat last tuesday. He has had greenish sputum for couple month, getting worse. He is also having sinnus congestion for a while. No more night sweat, no more fever.  He denies dysuria, no teeth pain, sore throat.  He had some diaarhea 2 days ago ( tuesday)  that has resolved. He had 6 to 7 watery stool. He had sick contact  his daughter in law had stomach viral .  no vomiting, no abdominal pain.   Depression History:      The patient denies a depressed mood most of the day and a diminished interest in his usual daily activities.         Preventive Screening-Counseling & Management  Alcohol-Tobacco     Alcohol drinks/day: 0     Smoking Status: current     Smoking  Cessation Counseling: yes     Packs/Day: 2-3 ppd     Year Quit: Feb. 2010     Passive Smoke Exposure: no  Caffeine-Diet-Exercise     Does Patient Exercise: no  Current Medications (verified): 1)  Atenolol 50 Mg Tabs (Atenolol) .... Take 1 Tablet By Mouth Once A Day 2)  Metformin Hcl 500 Mg Tabs (Metformin Hcl) .... Take 1 Tablet By Mouth Two Times A Day 3)  Fish Oil 1000 Mg Caps (Omega-3 Fatty Acids) .... Take 2 Tablets By Mouth Two Times A Day. 4)  Ventolin Hfa 108 (90 Base) Mcg/act Aers (Albuterol Sulfate) .... Inhale 2 Puffs Every 4 Hours As Needed For Shortness of Breath 5)  Truetrack Blood Glucose  Devi (Blood Glucose Monitoring Suppl) .... Use To Check Blood Sugar 6)  Truetrack Test  Strp (Glucose Blood) .Marland KitchenMarland KitchenMarland Kitchen  Use To Test Blood Sugar 7)  Lancets  Misc (Lancets) .... Use To Test Blood Sugar 8)  Norco 10-325 Mg Tabs (Hydrocodone-Acetaminophen) .... Take 1 Tablet Every 6 Hours As Needed For Pain. 9)  Tricor 48 Mg Tabs (Fenofibrate) .... Take 1 Tablet By Mouth Once A Day 10)  Zantac 150 Mg Tabs (Ranitidine Hcl) .... Take 1 Tablet By Mouth Two Times A Day 11)  Plavix 75 Mg Tabs (Clopidogrel Bisulfate) .... Take 1 Tablet By Mouth Once A Day 12)  Bayer Low Strength 81 Mg Tbec (Aspirin) 13)  Viagra 100 Mg Tabs (Sildenafil Citrate) .... Take 1 Tablet At Least 30 Minutes Before Intercourse.  Do Not Take More Than 1 Tablet in A 24 Hour Period. 14)  Protonix 40 Mg Tbec (Pantoprazole Sodium) .... Take 1 Tablet By Mouth Once A Day 15)  Vistaril 25 Mg Caps (Hydroxyzine Pamoate) .... Take 1 Tablet Two Times A Day As Needed Itchng 16)  Simvastatin 40 Mg Tabs (Simvastatin) .... Once Daily  Allergies: 1)  ! Benadryl  Review of Systems  The patient denies chest pain, dyspnea on exertion, peripheral edema, and hematochezia.    Physical Exam  General:  alert and well-developed.   Head:  normocephalic and atraumatic.   Nose:  no external deformity, no external erythema, and mucosal erythema.     Lungs:  normal respiratory effort, no intercostal retractions, no accessory muscle use, and normal breath sounds.   Heart:  normal rate and regular rhythm.   Abdomen:  soft, non-tender, normal bowel sounds, and no distention.   Extremities:  No edema.    Impression & Recommendations:  Problem # 1:  FEVER UNSPECIFIED (ICD-780.60) He had a fever, for that reason his surgery for possible stent placement was cancelled. He was refer for further evaluation. I think he had a viral gastroenteritis. He was having diarrhea with the fever and he had a sick contact also. I will check chest xray due to history of cough and current smoker status. If chest xray show PNA I will give him course of antibiotics. The fever and diarrhea has resloved. I will send a letter to DR. Devesshewar after I get the chest xray result. He had a CBC done March 23 with normal WBC.  Dr. Richelle Ito. Orders: CXR- 2view (CXR)  Problem # 2:  TOBACCO ABUSE (ICD-305.1) Counseling was provied for smoking cessation. Importance of smoking cessation was stressed out  to the patient.   Complete Medication List: 1)  Atenolol 50 Mg Tabs (Atenolol) .... Take 1 tablet by mouth once a day 2)  Metformin Hcl 500 Mg Tabs (Metformin hcl) .... Take 1 tablet by mouth two times a day 3)  Fish Oil 1000 Mg Caps (Omega-3 fatty acids) .... Take 2 tablets by mouth two times a day. 4)  Ventolin Hfa 108 (90 Base) Mcg/act Aers (Albuterol sulfate) .... Inhale 2 puffs every 4 hours as needed for shortness of breath 5)  Truetrack Blood Glucose Devi (Blood glucose monitoring suppl) .... Use to check blood sugar 6)  Truetrack Test Strp (Glucose blood) .... Use to test blood sugar 7)  Lancets Misc (Lancets) .... Use to test blood sugar 8)  Norco 10-325 Mg Tabs (Hydrocodone-acetaminophen) .... Take 1 tablet every 6 hours as needed for pain. 9)  Tricor 48 Mg Tabs (Fenofibrate) .... Take 1 tablet by mouth once a day 10)  Zantac 150 Mg Tabs (Ranitidine hcl)  .... Take 1 tablet by mouth two times a day 11)  Plavix 75 Mg  Tabs (Clopidogrel bisulfate) .... Take 1 tablet by mouth once a day 12)  Bayer Low Strength 81 Mg Tbec (Aspirin) 13)  Viagra 100 Mg Tabs (Sildenafil citrate) .... Take 1 tablet at least 30 minutes before intercourse.  do not take more than 1 tablet in a 24 hour period. 14)  Protonix 40 Mg Tbec (Pantoprazole sodium) .... Take 1 tablet by mouth once a day 15)  Vistaril 25 Mg Caps (Hydroxyzine pamoate) .... Take 1 tablet two times a day as needed itchng 16)  Simvastatin 40 Mg Tabs (Simvastatin) .... Once daily  Other Orders: Capillary Blood Glucose/CBG (045[address])  Patient Instructions: 1)  Please schedule a follow-up appointment in 1 month.   Prevention & Chronic Care Immunizations   Influenza vaccine: Fluvax MCR  (10/16/2008)    Tetanus booster: Not documented    Pneumococcal vaccine: Not documented  Colorectal Screening   Hemoccult: Not documented    Colonoscopy: Not documented   Colonoscopy action/deferral: GI referral  (01/18/2009)  Other Screening   PSA: Not documented   PSA action/deferral: Discussed-decision deferred  (01/18/2009)   Smoking status: current  (04/05/2009)   Smoking cessation counseling: yes  (04/05/2009)  Diabetes Mellitus   HgbA1C: 6.2  (02/18/2009)    Eye exam: No diabetic retinopathy.     (03/09/2008)   Diabetic eye exam action/deferral: Ophthalmology referral  (01/18/2009)   Eye exam due: 03/2009    Foot exam: yes  (02/10/2008)   Foot exam action/deferral: Do today   High risk foot: No  (02/10/2008)   Foot care education: Done  (04/05/2009)    Urine microalbumin/creatinine ratio: 97.6  (02/10/2008)  Lipids   Total Cholesterol: 198  (01/19/2009)   Lipid panel action/deferral: Lipid Panel ordered   LDL: 87  (01/19/2009)   LDL Direct: Not documented   HDL: 34  (01/19/2009)   Triglycerides: 386  (01/19/2009)    SGOT (AST): 26  (01/19/2009)   BMP action: Ordered   SGPT (ALT): 29   (01/19/2009)   Alkaline phosphatase: 64  (01/19/2009)   Total bilirubin: 0.5  (01/19/2009)  Hypertension   Last Blood Pressure: 138 / 82  (04/05/2009)   Serum creatinine: 0.91  (01/19/2009)   BMP action: Ordered   Serum potassium 4.9  (01/19/2009)  Self-Management Support :   Personal Goals (by the next clinic visit) :     Personal A1C goal: 7  (04/05/2009)     Personal blood pressure goal: 130/80  (04/05/2009)     Personal LDL goal: 100  (04/05/2009)    Patient will work on the following items until the next clinic visit to reach self-care goals:     Medications and monitoring: take my medicines every day, bring all of my medications to every visit  (04/05/2009)     Eating: eat more vegetables, use fresh or frozen vegetables  (04/05/2009)     Activity: take a 30 minute walk every day  (02/18/2009)    Diabetes self-management support: Education handout, Resources for patients handout, Written self-care plan  (04/05/2009)   Diabetes care plan printed   Diabetes education handout printed    Hypertension self-management support: Education handout, Resources for patients handout, Written self-care plan  (04/05/2009)   Hypertension self-care plan printed.   Hypertension education handout printed    Lipid self-management support: Education handout, Resources for patients handout, Written self-care plan  (04/05/2009)   Lipid self-care plan printed.   Lipid education handout printed      Resource handout printed.   Nursing Instructions: Diabetic  foot exam today

## 2010-02-11 NOTE — Progress Notes (Signed)
Summary: Refill/gh  Phone Note Refill Request Message from:  Fax from Pharmacy on November 15, 2009 12:07 PM  Refills Requested: Medication #1:  PROTONIX 40 MG TBEC Take 1 tablet by mouth once a day   Last Refilled: 10/10/2009  Method Requested: Fax to Local Pharmacy Initial call taken by: Angelina Ok RN,  November 15, 2009 12:07 PM  Follow-up for Phone Call        Rx faxed to pharmacy Follow-up by: Mariea Stable MD,  November 15, 2009 1:36 PM    Prescriptions: PROTONIX 40 MG TBEC (PANTOPRAZOLE SODIUM) Take 1 tablet by mouth once a day  #30 x 3   Entered and Authorized by:   Mariea Stable MD   Signed by:   Mariea Stable MD on 11/15/2009   Method used:   Faxed to ...       [facility] Department (retail)       [address] [place], [place]  [address]       Ph: [phone]       Fax: [phone]   RxID:   [id number]

## 2010-02-11 NOTE — Miscellaneous (Signed)
Summary: [facility]  [facility]   Imported By: Shon Hough 08/21/2009 10:36:48  _____________________________________________________________________  External Attachment:    Type:   Image     Comment:   External Document

## 2010-02-11 NOTE — Miscellaneous (Signed)
Summary: [facility]  [facility]   Imported By: Shon Hough 07/29/2009 14:39:44  _____________________________________________________________________  External Attachment:    Type:   Image     Comment:   External Document

## 2010-02-11 NOTE — Progress Notes (Signed)
Summary: possible stints/ hla  Phone Note Call from Patient   Summary of Call: pt calls to say he will be adm 2/22 for angiogram and possible stints Initial call taken by: Marin Roberts RN,  February 06, 2009 10:50 AM

## 2010-02-11 NOTE — Miscellaneous (Signed)
Summary: [facility]- Consent  [facility]- Consent   Imported By: Shon Hough 08/13/2009 14:53:00  _____________________________________________________________________  External Attachment:    Type:   Image     Comment:   External Document

## 2010-02-11 NOTE — Progress Notes (Signed)
Summary: refill/gg  Phone Note Refill Request  on Jun 11, 2009 12:50 PM  Refills Requested: Medication #1:  ATENOLOL 50 MG TABS Take 1 tablet by mouth once a day  Method Requested: Fax to Local Pharmacy Initial call taken by: Merrie Roof RN,  Jun 11, 2009 12:51 PM  Follow-up for Phone Call        Rx faxed to pharmacy Follow-up by: Mariea Stable MD,  June 12, 2009 8:06 AM    Prescriptions: ATENOLOL 50 MG TABS (ATENOLOL) Take 1 tablet by mouth once a day  #30 x 11   Entered and Authorized by:   Mariea Stable MD   Signed by:   Mariea Stable MD on 06/12/2009   Method used:   Telephoned to ...       [facility] Department (retail)       [address] [place], [place]  [address]       Ph: [phone]       Fax: [phone]   RxID:   [id number]

## 2010-02-11 NOTE — Progress Notes (Signed)
Summary: Refill/gh  Phone Note Refill Request Message from:  Fax from Pharmacy on December 12, 2009 1:30 PM  Refills Requested: Medication #1:  VISTARIL 25 MG CAPS take 1 tablet two times a day as needed itchng   Last Refilled: 05/06/2009  Method Requested: Fax to Local Pharmacy Initial call taken by: Angelina Ok RN,  December 12, 2009 1:31 PM  Follow-up for Phone Call        Rx faxed to pharmacy Follow-up by: Mariea Stable MD,  December 13, 2009 12:01 AM    Prescriptions: VISTARIL 25 MG CAPS (HYDROXYZINE PAMOATE) take 1 tablet two times a day as needed itchng  #30 x 1   Entered and Authorized by:   Mariea Stable MD   Signed by:   Mariea Stable MD on 12/13/2009   Method used:   Faxed to ...       [facility] Department (retail)       [address] [place], [place]  [address]       Ph: [phone]       Fax: [phone]   RxID:   [id number]
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
## 2010-02-11 NOTE — Progress Notes (Signed)
Summary: refill/gg  Phone Note Refill Request  on Jun 05, 2009 5:27 PM  Refills Requested: Medication #1:  PLAVIX 75 MG TABS Take 1 tablet by mouth once a day   Last Refilled: 03/08/2009  Method Requested: Fax to Local Pharmacy Initial call taken by: Merrie Roof RN,  Jun 05, 2009 5:28 PM  Follow-up for Phone Call        Rx faxed to pharmacy Follow-up by: Mariea Stable MD,  Jun 06, 2009 7:58 AM    Prescriptions: PLAVIX 75 MG TABS (CLOPIDOGREL BISULFATE) Take 1 tablet by mouth once a day  #30 x 6   Entered and Authorized by:   Mariea Stable MD   Signed by:   Mariea Stable MD on 06/06/2009   Method used:   Faxed to ...       [facility] (retail)       [address] [place], [place]  [address]       Ph: [phone]       Fax: [phone]   RxID:   [id number]

## 2010-02-11 NOTE — Progress Notes (Signed)
Summary: phone/gg  Phone Note Call from Patient   Caller: Patient Summary of Call: Pt called to inform you that Dr Corliss Skains called with results of MRI from yesterday.  Possible stents may be put in Thursday because the blockage has increased. Initial call taken by: Merrie Roof RN,  May 23, 2009 11:57 AM  Follow-up for Phone Call        Thanks Follow-up by: Mariea Stable MD,  May 24, 2009 2:45 PM
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
## 2010-02-11 NOTE — Progress Notes (Signed)
Summary: Refill/gh  Phone Note Refill Request Message from:  Patient on January 29, 2009 9:10 AM  Refills Requested: Medication #1:  NORCO 10-325 MG TABS take 1 tablet every 6 hours as needed for pain.  Cannot fill until 01/02/09  Medication #2:  SIMVASTATIN 40 MG TABS Take 1 tab by mouth at bedtime   Last Refilled: 12/13/2008  Method Requested: Electronic Initial call taken by: Angelina Ok RN,  January 29, 2009 9:10 AM  Follow-up for Phone Call        Approved Statin Norco denied- will need appointment- last script was not for chronic long term management- will need to be seen by Onalee Hua- and if not seen by Onalee Hua he will at least need to oversee or direct his pain mangmt care with another resident who will see patient. Last appt was on 1/7 and patient was not given Norco. Follow-up by: Julaine Fusi  DO,  January 31, 2009 4:51 PM  Additional Follow-up for Phone Call Additional follow up Details #1::        On the last visit from 01/18/09, Problom #6 herniated lumbar disk with radiculopathy.  It states that he is well controlled and that we need to change to long acting opiates soon.    I am currently in ICU but will be in clinic 1/22 pm.  I will fill then. Additional Follow-up by: Mariea Stable MD,  January 31, 2009 11:15 PM    Additional Follow-up for Phone Call Additional follow up Details #2::    Please fill both meds. Merrie Roof RN  February 01, 2009 11:10 AM   Additional Follow-up for Phone Call Additional follow up Details #3:: Details for Additional Follow-up Action Taken: printed Additional Follow-up by: Mariea Stable MD,  February 01, 2009 1:37 PM  Prescriptions: SIMVASTATIN 40 MG TABS (SIMVASTATIN) Take 1 tab by mouth at bedtime  #30 x 3   Entered and Authorized by:   Mariea Stable MD   Signed by:   Mariea Stable MD on 02/01/2009   Method used:   Print then Give to Patient   RxID:   [id number] NORCO 10-325 MG TABS (HYDROCODONE-ACETAMINOPHEN)  take 1 tablet every 6 hours as needed for pain.  Cannot fill until 01/02/09  #120 x 0   Entered and Authorized by:   Mariea Stable MD   Signed by:   Mariea Stable MD on 02/01/2009   Method used:   Print then Give to Patient   RxID:   [id number]

## 2010-02-11 NOTE — Progress Notes (Signed)
Summary: ED Pain Care Plan  ---- Converted from flag ---- ---- 08/20/2009 3:33 PM, Mariea Stable MD wrote: yes I do.  thanks, manny  ---- 08/20/2009 9:58 AM, Dorie Rank RN wrote:   ---- 08/20/2009 9:56 AM, Dorie Rank RN wrote: Freddrick March,     I am submitting to the ED a list of pts for whom their PCP has determined that no narcotics should be prescribed by [facility] physicians. The ED chart will simply note "Pain Care Plan" which will indicate to the ED physician that the pt has had issues with narcotics/pain meds in the [facility]. I submitted some names of pts in May, but am double checking now to be sure the list is correct/up-to-date.     Do you want the Pain Care Plan note "applied" to this pt for ED visits? thanks, sharon p ------------------------------

## 2010-02-11 NOTE — Progress Notes (Signed)
  Phone Note Call from Patient   Call For: [name] ntii Details for Reason: [facility] Summary of Call: PATIENT RETURNED MY CALL TO GET SCHEDULED WITH THE [facility]/  HIS APPT IS FOR APRIL 20, 011 AT 10:30AM / ALSO A APPT REMINDER LETTER WAS MAILED TO THE PATIENT WITH ALL THE INFO.Zehra Rucci NT II  March 07, 2009 12:26 PM
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
## 2010-02-11 NOTE — Consult Note (Signed)
Summary: [facility]  [facility]   Imported By: Louretta Parma 10/31/2009 14:57:13  _____________________________________________________________________  External Attachment:    Type:   Image     Comment:   External Document  Appended Document: [facility]    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 Carola Frost:  "at this point he can begin graduated weightbearing.  I have started him on Neurontin titrating him up to 900mg  from the initial 300mg .  There is no question he will be disabled for at least a year and therefore should qualify for disability given his open fracture complicated by osteomyelitis with all his comorbidities.  I have given him another #130 plain oxycodone 10mg  tablet for the next 3+ weeks and I anticipate continuing his wean.  Wound care will continue as per Dr. Kelly Splinter.  He was continued on doxycycline 100mg  two times a day for another 30 days after which we will also d/c further antibiotics."  Complete Medication List: 1)  Atenolol 50 Mg Tabs (Atenolol) .... Take 1 tablet by mouth once a day 2)  Metformin Hcl 500 Mg Tabs (Metformin hcl) .... Take 1 tablet by mouth two times a day 3)  Fish Oil 1000 Mg Caps (Omega-3 fatty acids) .... Take 2 tablets by mouth two times a day. 4)  Ventolin Hfa 108 (90 Base) Mcg/act Aers (Albuterol sulfate) .... Inhale 2 puffs every 4 hours as needed for shortness of breath 5)  Truetrack Blood Glucose Devi (Blood glucose monitoring suppl) .... Use to check blood sugar 6)  Truetrack Test Strp (Glucose blood) .... Use to test blood sugar 7)  Lancets Misc (Lancets) .... Use to test blood sugar 8)  Tricor 48 Mg Tabs (Fenofibrate) .... Take 1 tablet by mouth once a day 9)  Zantac 150 Mg Tabs (Ranitidine hcl) .... Take 1 tablet by mouth two times a day 10)  Plavix 75 Mg Tabs (Clopidogrel bisulfate)  .... Take 1 tablet by mouth once a day 11)  Bayer Low Strength 81 Mg Tbec (Aspirin) 12)  Viagra 100 Mg Tabs (Sildenafil citrate) .... Take 1 tablet at least 30 minutes before intercourse.  do not take more than 1 tablet in a 24 hour period. 13)  Protonix 40 Mg Tbec (Pantoprazole sodium) .... Take 1 tablet by mouth once a day 14)  Vistaril 25 Mg Caps (Hydroxyzine pamoate) .... Take 1 tablet two times a day as needed itchng 15)  Lipitor 40 Mg Tabs (Atorvastatin calcium) .... Take 1 tab by mouth at bedtime 16)  Oxycodone Hcl 5 Mg Caps (Oxycodone hcl) .Marland Kitchen.. 1 every 4-6 hours as needed pain 17)  Lisinopril 5 Mg Tabs (Lisinopril) .... Take 1 tablet by mouth once a day

## 2010-02-11 NOTE — Miscellaneous (Signed)
Summary: [facility]   Imported By: Margie Billet 10/16/2009 11:17:09  _____________________________________________________________________  External Attachment:    Type:   Image     Comment:   External Document

## 2010-02-11 NOTE — Progress Notes (Signed)
Summary: REFILL/GG  Phone Note Refill Request  on February 06, 2009 4:43 PM  Refills Requested: Medication #1:  SIMVASTATIN 40 MG TABS Take 1 tab by mouth at bedtime ***HEALTH DEPT CAN PROVIDE LIPITOR 20 MG AT NO CHARGE.  WOULD YOU CONCIDER CHANGING FROM SIMVASTATIN????   Method Requested: Fax to Local Pharmacy Initial call taken by: Merrie Roof RN,  February 06, 2009 4:43 PM  Follow-up for Phone Call        Pt has tried lipitor in the past and complained of muscle aches and cramps.  I am willing to change it and give it another try.  If he has symptoms again, will have to go back to simvastatin 40   Follow-up by: Mariea Stable MD,  February 07, 2009 7:22 AM    New/Updated Medications: LIPITOR 20 MG TABS (ATORVASTATIN CALCIUM) Take 1 tab by mouth at bedtime Prescriptions: LIPITOR 20 MG TABS (ATORVASTATIN CALCIUM) Take 1 tab by mouth at bedtime  #30 x 3   Entered and Authorized by:   Mariea Stable MD   Signed by:   Mariea Stable MD on 02/07/2009   Method used:   Faxed to ...       [facility] Department (retail)       [address] [place], [place]  [address]       Ph: [phone]       Fax: [phone]   RxID:   [id number]

## 2010-02-11 NOTE — Progress Notes (Signed)
Summary: Medications  Phone Note Other Incoming   Caller: Marine scientist of Call: Call from the Child psychotherapist for pt pt is unable to afford his Neurontin.  Told SW that he could get it from the Clinics for free.  SW said that it is very impt that pt get his medications.  Several calls to the [facility] to see what could be done there to assist the pt. Angelina Ok RN  October 29, 2009 2:52 PM  Initial call taken by: Angelina Ok RN,  October 29, 2009 2:52 PM  Follow-up for Phone Call        coordination with gladys and inpatient care mgmt susan brady [phone]) as patient needs assist with meds and copays due to homeless status.  Call to [facility] to advocate for patient and they instructed [place] to send him to Pharmacy to explain homeless status and they will assist him.  Advised Vance Peper that patient should connect with [facility].  Dorothe Pea  October 31, 2009 12:32 PM
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
## 2010-02-11 NOTE — Progress Notes (Signed)
Summary: Refill/gh  Phone Note Refill Request Message from:  Fax from Pharmacy on November 20, 2009 3:45 PM  Refills Requested: Medication #1:  ZANTAC 150 MG TABS Take 1 tablet by mouth two times a day [facility] does not carry Ranitidine 150 mg .  They do carry Pepcid  20mg  .  Pt is on Plavix.   Method Requested: Fax to Local Pharmacy Initial call taken by: Angelina Ok RN,  November 20, 2009 3:46 PM  Follow-up for Phone Call        Rx faxed to pharmacy  changed to pepcid. Follow-up by: Mariea Stable MD,  November 25, 2009 2:59 PM    New/Updated Medications: PEPCID 40 MG TABS (FAMOTIDINE) Take 1 tab by mouth at bedtime Prescriptions: PEPCID 40 MG TABS (FAMOTIDINE) Take 1 tab by mouth at bedtime  #30 x 3   Entered and Authorized by:   Mariea Stable MD   Signed by:   Mariea Stable MD on 11/25/2009   Method used:   Faxed to ...       [facility] Department (retail)       [address] [place], [place]  [address]       Ph: [phone]       Fax: [phone]   RxID:   [id number]

## 2010-02-11 NOTE — Letter (Signed)
Summary: New Patient letter  [facility]  [address] [place], [place] [address]   Phone: [phone]  Fax: [phone]       01/22/2009 MRN: [id number]  Kaitlin Debski [address] [place], [place]  [address]  Dear Mr. Kertz,  Welcome to the Gastroenterology Division at [facility].    You are scheduled to see Dr. Leone Payor on 02-19-09 at 9:00a.m. on the 3rd floor at [facility], [address]  We ask that you try to arrive at our office 15 minutes prior to your appointment time to allow for check-in.  We would like you to complete the enclosed self-administered evaluation form prior to your visit and bring it with you on the day of your appointment.  We will review it with you.  Also, please bring a complete list of all your medications or, if you prefer, bring the medication bottles and we will list them.  Please bring your insurance card so that we may make a copy of it.  If your insurance requires a referral to see a specialist, please bring your referral form from your primary care physician.  Co-payments are due at the time of your visit and may be paid by cash, check or credit card.     Your office visit will consist of a consult with your physician (includes a physical exam), any laboratory testing he/she may order, scheduling of any necessary diagnostic testing (e.g. x-ray, ultrasound, CT-scan), and scheduling of a procedure (e.g. Endoscopy, Colonoscopy) if required.  Please allow enough time on your schedule to allow for any/all of these possibilities.    If you cannot keep your appointment, please call [phone] to cancel or reschedule prior to your appointment date.  This allows [place] the opportunity to schedule an appointment for another patient in need of care.  If you do not cancel or reschedule by 5 p.m. the business day prior to your appointment date, you will be charged a $50.00 late cancellation/no-show fee.    Thank you for choosing  [facility] for your medical needs.  We appreciate the opportunity to care for you.  Please visit [place] at our website  to learn more about our practice.                     Sincerely,                                                             The Gastroenterology Division
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
## 2010-02-11 NOTE — Progress Notes (Signed)
Summary: nerve med/ hla  Phone Note Call from Patient   Summary of Call: pt calls to state that the nerve med you ordered for him is just barely touching the problem he needs something stronger and would like it done today so he can be settled by his visit next week Initial call taken by: Marin Roberts RN,  August 30, 2008 4:40 PM  Follow-up for Phone Call        I have been working with him and increasing/adding medications.  He has a PSA history and will not increase nor add more medications.  He will need to be seen for further discussion on follow up visit  Follow-up by: Mariea Stable MD,  August 30, 2008 5:19 PM  Additional Follow-up for Phone Call Additional follow up Details #1::        spoke w/pt, he will wait til appt Additional Follow-up by: Marin Roberts RN,  August 31, 2008 10:37 AM

## 2010-02-11 NOTE — Progress Notes (Signed)
Summary: med refill/gp  Phone Note Refill Request Message from:  Fax from Pharmacy on July 08, 2009 12:21 PM  Refills Requested: Medication #1:  PROTONIX 40 MG TBEC Take 1 tablet by mouth once a day   Last Refilled: 04/04/2009  Method Requested: Fax to Local Pharmacy Initial call taken by: Chinita Pester RN,  July 08, 2009 12:21 PM  Follow-up for Phone Call        Rx faxed to pharmacy Follow-up by: Mariea Stable MD,  July 08, 2009 12:25 PM    Prescriptions: PROTONIX 40 MG TBEC (PANTOPRAZOLE SODIUM) Take 1 tablet by mouth once a day  #30 x 0   Entered and Authorized by:   Mariea Stable MD   Signed by:   Mariea Stable MD on 07/08/2009   Method used:   Faxed to ...       [facility] (retail)       [address] [place], [place]  [address]       Ph: [phone]       Fax: [phone]   RxID:   [id number]

## 2010-02-11 NOTE — Progress Notes (Signed)
Summary: surg clearance/ hla  Phone Note Call from Patient   Summary of Call: pt calls and states he is to have surg on his ankle tues 8/30, states the surgeon told him to see his pcp today or tomorrow for surg clearance but he is out of town so monday will be better, appt is scheduled for mon am. surgery is w/ dr handy, ortho. Initial call taken by: Marin Roberts RN,  September 05, 2009 11:57 AM  Follow-up for Phone Call        OK, will do an eval on monday.  Follow-up by: Darnelle Maffucci MD,  September 05, 2009 4:51 PM  Additional Follow-up for Phone Call Additional follow up Details #1::        Please refer to PMH during office visit.  Pt needs to have procedure done and would think it is of moderate risk.  He has also had a CVA in past with ongoing critical stenoses that were going to be intervened on along with PVD s/p fem-pop bypass.  All said, without knowing the details of the procedure I would consider it of at least moderate risk but something that needs to be done.  Additional Follow-up by: Mariea Stable MD,  September 06, 2009 11:35 AM

## 2010-02-11 NOTE — Letter (Signed)
Summary: Anticoagulation Modification Letter  [facility]  [address] [place], [place] [address]   Phone: [phone]  Fax: [phone]    February 20, 2009  Re:    [name] DOB:    July 31, 1960 MRN:  [id number]   Dear Dr. Corliss Skains:  We would like to schedule the above patient for an endoscopic procedure. Our records show that  he is on anticoagulation therapy. Please advise as to how long the patient may come off their therapy of Plavix prior to the procedure.  Please also advise if the patient may not hold his Plavix.  Please fax the completed form to [place] at [phone].  Thank you for your help with this matter.  Sincerely,  Francee Piccolo CMA [facility])   Physician Recommendation:  Hold Plavix 7 days prior ________________

## 2010-02-11 NOTE — Miscellaneous (Signed)
Summary: Medication Contract  Medication Contract   Imported By: Florinda Marker 02/19/2009 13:52:15  _____________________________________________________________________  External Attachment:    Type:   Image     Comment:   External Document
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
## 2010-02-11 NOTE — Progress Notes (Signed)
Summary: refill/gg  Phone Note Refill Request  on April 30, 2009 9:55 AM  Refills Requested: Medication #1:  NORCO 10-325 MG TABS take 1 tablet every 6 hours as needed for pain.   Last Refilled: 04/01/2009  Method Requested: Telephone to Pharmacy Initial call taken by: Merrie Roof RN,  April 30, 2009 9:58 AM  Follow-up for Phone Call        Refill approved-nurse to complete Follow-up by: Mariea Stable MD,  May 01, 2009 8:58 AM  Additional Follow-up for Phone Call Additional follow up Details #1::        Rx faxed to pharmacy Additional Follow-up by: Merrie Roof RN,  May 01, 2009 2:42 PM    Prescriptions: NORCO 10-325 MG TABS (HYDROCODONE-ACETAMINOPHEN) take 1 tablet every 6 hours as needed for pain.  #120 x 0   Entered and Authorized by:   Mariea Stable MD   Signed by:   Mariea Stable MD on 05/01/2009   Method used:   Telephoned to ...       [facility] (retail)       [address] [place], [place]  [address]       Ph: [phone]       Fax: [phone]   RxID:   [id number]
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
## 2010-02-11 NOTE — Progress Notes (Signed)
Summary: Refill/gh  Phone Note Refill Request   Refills Requested: Medication #1:  Norco Pt called wants a refill.   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 SEE DR. ALVAREZS NOTES. 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 THIS IN NOT APPROPRIATE BEHAVIOR AT ALL! IN OTHER WORDS NO WAY IS HE GETTING A NARCOTIC FROM ME TODAY! Follow-up by: Acey Lav MD,  May 31, 2009 4:14 PM

## 2010-02-11 NOTE — Progress Notes (Signed)
Summary: refill/gg  Phone Note Refill Request  on March 07, 2009 4:31 PM  Refills Requested: Medication #1:  METFORMIN HCL 500 MG TABS Take 1 tablet by mouth two times a day  Method Requested: Fax to Local Pharmacy Initial call taken by: Merrie Roof RN,  March 07, 2009 4:31 PM  Follow-up for Phone Call        Rx faxed to pharmacy Follow-up by: Mariea Stable MD,  March 08, 2009 6:46 AM    Prescriptions: METFORMIN HCL 500 MG TABS (METFORMIN HCL) Take 1 tablet by mouth two times a day  #60 x 5   Entered and Authorized by:   Mariea Stable MD   Signed by:   Mariea Stable MD on 03/08/2009   Method used:   Faxed to ...       [facility] (retail)       [address] [place], [place]  [address]       Ph: [phone]       Fax: [phone]   RxID:   [id number]

## 2010-02-11 NOTE — Progress Notes (Signed)
  Phone Note Outgoing Call   Call placed by: Patient Summary of Call: Patient has called asking [place] to write a letter on his behalf stating he will be disabled for at least 6 months.  He also tells me he is without transportation.   I told him we would write letter for his June 1st appointment and I would call [facility] to see if they would go out to [place] [employer] [place] where he is living with a family member.   [address]   [place]  [place]

## 2010-02-11 NOTE — Assessment & Plan Note (Signed)
Summary: er f/u, visit before card procedure/pcp-[name]/hla   Vital Signs:  Patient profile:   50 year old male Height:      72 inches (182.88 cm) Weight:      194.7 pounds (88.50 kg) BMI:     26.50 Temp:     96.6 degrees F (35.89 degrees C) oral Pulse rate:   85 / minute BP sitting:   126 / 72  (right arm)  Vitals Entered By: Stanton Kidney Ditzler RN (February 18, 2009 3:52 PM) Is Patient Diabetic? Yes Did you bring your meter with you today? No Pain Assessment Patient in pain? yes     Location: both legs Intensity: 5 Type: sharp Onset of pain  past year Nutritional Status BMI of 25 - 29 = overweight Nutritional Status Detail appetite good CBG Result 107  Have you ever been in a relationship where you felt threatened, hurt or afraid?denies   Does patient need assistance? Functional Status Self care Ambulation Normal Comments Angiogram sch [facility] 03/05/09 Dr Bedelia Person.   History of Present Illness: [name] is a 50 yo man with PMH as outlined in chart.  He is here to let us know he is going to have an angiogram with potential stenting by Dr. Bedelia Person.  He states he is doing "great".  Reports he was recently in the ED with positive UDS but denies use.  Reports he went becasue he was having CP, headache and thought he was having another stroke.  He has had no further episodes.    Still smoking 2-3 ppd Just started chantix 1 week ago, and quit date in 1 week (no suicidal ideation, no issues with dreams, no depression) No alcohol No drugs  Of note, he is still using simvastatin not lipitor   Depression History:      The patient denies a depressed mood most of the day and a diminished interest in his usual daily activities.         Preventive Screening-Counseling & Management  Alcohol-Tobacco     Alcohol drinks/day: 0     Smoking Status: current     Smoking Cessation Counseling: yes     Packs/Day: 2-3 ppd     Year Quit: Feb. 2010     Passive Smoke Exposure:  no  Caffeine-Diet-Exercise     Does Patient Exercise: no  Current Medications (verified): 1)  Atenolol 50 Mg Tabs (Atenolol) .... Take 1 Tablet By Mouth Once A Day 2)  Metformin Hcl 500 Mg Tabs (Metformin Hcl) .... Take 1 Tablet By Mouth Two Times A Day 3)  Fish Oil 1000 Mg Caps (Omega-3 Fatty Acids) .... Take 2 Tablets By Mouth Two Times A Day. 4)  Ventolin Hfa 108 (90 Base) Mcg/act Aers (Albuterol Sulfate) .... Inhale 2 Puffs Every 4 Hours As Needed For Shortness of Breath 5)  Truetrack Blood Glucose  Devi (Blood Glucose Monitoring Suppl) .... Use To Check Blood Sugar 6)  Truetrack Test  Strp (Glucose Blood) .... Use To Test Blood Sugar 7)  Lancets  Misc (Lancets) .... Use To Test Blood Sugar 8)  Norco 10-325 Mg Tabs (Hydrocodone-Acetaminophen) .... Take 1 Tablet Every 6 Hours As Needed For Pain.  Cannot Fill Until 01/02/09 9)  Tricor 48 Mg Tabs (Fenofibrate) .... Take 1 Tablet By Mouth Once A Day 10)  Lipitor 20 Mg Tabs (Atorvastatin Calcium) .... Take 1 Tab By Mouth At Bedtime 11)  Zantac 150 Mg Tabs (Ranitidine Hcl) .... Take 1 Tablet By Mouth Two Times A Day 12)  Plavix 75 Mg Tabs (Clopidogrel Bisulfate) .... Take 1 Tablet By Mouth Once A Day 13)  Flonase 50 Mcg/act Susp (Fluticasone Propionate) .... Use 2 Sprays in Each Nostril At Bedtime 14)  Claritin 10 Mg Tabs (Loratadine) .... Take 1 Tablet By Mouth Once A Day 15)  Bayer Low Strength 81 Mg Tbec (Aspirin) 16)  Chantix Starting Month Pak 0.5 Mg X 11 & 1 Mg X 42 Tabs (Varenicline Tartrate) .... Take As Directed, Start 2 Weeks Before Your Quit Date. 17)  Chantix Continuing Month Pak 1 Mg Tabs (Varenicline Tartrate) .... Take As Directed For Month Number 2 and 3. 18)  Viagra 100 Mg Tabs (Sildenafil Citrate) .... Take 1 Tablet At Least 30 Minutes Before Intercourse.  Do Not Take More Than 1 Tablet in A 24 Hour Period. 19)  Protonix 40 Mg Tbec (Pantoprazole Sodium) .... Take 1 Tablet By Mouth Once A Day 20)  Vistaril 25 Mg Caps  (Hydroxyzine Pamoate) .... Take 1 Tablet Two Times A Day As Needed Itchng  Allergies: 1)  ! Benadryl  Social History: Packs/Day:  2-3 ppd  Review of Systems      See HPI  Physical Exam  General:  alert and overweight-appearing.  Eyes:  vision grossly intact, pupils equal, pupils round, and pupils reactive to light.   Lungs:  normal respiratory effort, no accessory muscle use, normal breath sounds, no crackles, and no wheezes.   Heart:  normal rate, regular rhythm, no murmur, and no gallop.   Abdomen:  normal bowel sounds.   Neurologic:  alert & oriented X3, cranial nerves II-XII intact, strength normal in all extremities, sensation intact to light touch, and gait normal.   Psych:  Oriented X3, memory intact for recent and remote, normally interactive.  Appears agitated   Impression & Recommendations:  Problem # 1:  TOBACCO ABUSE (ICD-305.1) started chantix 1 week ago quit date in 1 week  His updated medication list for this problem includes:    Chantix Starting Month Pak 0.5 Mg X 11 & 1 Mg X 42 Tabs (Varenicline tartrate) .Marland Kitchen... Take as directed, start 2 weeks before your quit date.    Chantix Continuing Month Pak 1 Mg Tabs (Varenicline tartrate) .Marland Kitchen... Take as directed for month number 2 and 3.  Problem # 2:  CEREBROVASCULAR ACCIDENT, HX OF (ICD-V12.50) going for angiogram on 2/22 by dr devenshwar, may need stenting RF well controlled attempting to quit smoking with chantix UDS positive for cocaine, though he denies.... discussed at length.  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  Also has L spine foraminal stenosis along with PVD.  However, recent UDS positive for cociane.  Have discussed issue at hand with pt.  He is agreeable to have UDS today.  If negative, will continue with pain management as pain clinic requires $500.   He will need to submit urine samples at random and if any violation of pain contract, pt will no longer recieve opiates from clinic. Pt did not have prior pain contract as he was supposed to go to pain clinic for further management.  Now that we will continue to do so, pain contract done today.  This was all discussed with Dr. Meredith Pel.   Problem # 4:  HYPERLIPIDEMIA (ICD-272.4) continue current regimen once switch to lipitor, will need to monitor for myalgias.... may need to switch back to  zocor  His updated medication list for this problem includes:    Tricor 48 Mg Tabs (Fenofibrate) .Marland Kitchen... Take 1 tablet by mouth once a day    Lipitor 20 Mg Tabs (Atorvastatin calcium) .Marland Kitchen... Take 1 tab by mouth at bedtime  Labs Reviewed: SGOT: 26 (01/19/2009)   SGPT: 29 (01/19/2009)   HDL:34 (01/19/2009), 34 (10/16/2008)  LDL:87 (01/19/2009), 86 (10/16/2008)  Chol:198 (01/19/2009), 150 (10/16/2008)  Trig:386 (01/19/2009), 149 (10/16/2008)  Problem # 5:  HYPERTENSION, BENIGN ESSENTIAL (ICD-401.1) at goal  His updated medication list for this problem includes:    Atenolol 50 Mg Tabs (Atenolol) .Marland Kitchen... Take 1 tablet by mouth once a day  BP today: 126/72 Prior BP: 120/73 (01/18/2009)  Labs Reviewed: K+: 4.9 (01/19/2009) Creat: : 0.91 (01/19/2009)   Chol: 198 (01/19/2009)   HDL: 34 (01/19/2009)   LDL: 87 (01/19/2009)   TG: 386 (01/19/2009)  Problem # 6:  DIABETES MELLITUS (ICD-250.00) at goal  His updated medication list for this problem includes:    Metformin Hcl 500 Mg Tabs (Metformin hcl) .Marland Kitchen... Take 1 tablet by mouth two times a day    Bayer Low Strength 81 Mg Tbec (Aspirin)  Orders: T- Capillary Blood Glucose (30865) T-Hgb A1C (in-house) (78469GE)  Labs Reviewed: Creat: 0.91 (01/19/2009)     Last Eye Exam: No diabetic retinopathy.    (03/09/2008) Reviewed HgBA1c results: 6.2 (02/18/2009)  5.9 (11/19/2008)  Problem # 7:  COCAINE ABUSE (ICD-305.60)  UDS positive during ED visit  02/03/09. Will recheck today, if positive, will not refill opiates any longer. If negative, will need to do pain contract and if positive at any point in future will no longer refill. Pt denies, advised to abstain if so, especially in light of CVA. Will offer drug counseling if positive today  Orders: T-Drug Screen-Urine, (single) [phone])  Complete Medication List: 1)  Atenolol 50 Mg Tabs (Atenolol) .... Take 1 tablet by mouth once a day 2)  Metformin Hcl 500 Mg Tabs (Metformin hcl) .... Take 1 tablet by mouth two times a day 3)  Fish Oil 1000 Mg Caps (Omega-3 fatty acids) .... Take 2 tablets by mouth two times a day. 4)  Ventolin Hfa 108 (90 Base) Mcg/act Aers (Albuterol sulfate) .... Inhale 2 puffs every 4 hours as needed for shortness of breath 5)  Truetrack Blood Glucose Devi (Blood glucose monitoring suppl) .... Use to check blood sugar 6)  Truetrack Test Strp (Glucose blood) .... Use to test blood sugar 7)  Lancets Misc (Lancets) .... Use to test blood sugar 8)  Norco 10-325 Mg Tabs (Hydrocodone-acetaminophen) .... Take 1 tablet every 6 hours as needed for pain.  cannot fill until 01/02/09 9)  Tricor 48 Mg Tabs (Fenofibrate) .... Take 1 tablet by mouth once a day 10)  Lipitor 20 Mg Tabs (Atorvastatin calcium) .... Take 1 tab by mouth at bedtime 11)  Zantac 150 Mg Tabs (Ranitidine hcl) .... Take 1 tablet by mouth two times a day 12)  Plavix 75 Mg Tabs (Clopidogrel bisulfate) .... Take 1 tablet by mouth once a day 13)  Flonase 50 Mcg/act Susp (Fluticasone propionate) .... Use 2 sprays in each nostril at bedtime 14)  Claritin 10 Mg Tabs (Loratadine) .... Take 1 tablet by mouth once a day 15)  Bayer Low Strength 81 Mg Tbec (Aspirin) 16)  Chantix Starting Month Pak 0.5 Mg X 11 & 1 Mg X 42 Tabs (Varenicline tartrate) .... Take as directed, start 2 weeks before your quit date. 17)  Chantix Continuing Month  Pak 1 Mg Tabs (Varenicline tartrate) .... Take as directed for month number 2 and  3. 18)  Viagra 100 Mg Tabs (Sildenafil citrate) .... Take 1 tablet at least 30 minutes before intercourse.  do not take more than 1 tablet in a 24 hour period. 19)  Protonix 40 Mg Tbec (Pantoprazole sodium) .... Take 1 tablet by mouth once a day 20)  Vistaril 25 Mg Caps (Hydroxyzine pamoate) .... Take 1 tablet two times a day as needed itchng  Patient Instructions: 1)  Please schedule a follow-up appointment in 1 month. 2)  Make sure to quit smoking in 1 week as discussed now that you have started chantix 3)  you are doing well overall. 4)  We will follow up the results of your angiogram on the 22nd. 5)  If you have any problems before your next visit, call clinic. Process Orders Check Orders Results:     [facility]: ABN not required for this insurance Tests Sent for requisitioning (February 18, 2009 4:43 PM):     02/18/2009: [facility] -- T-Drug Screen-Urine, (single) [phone] (signed)    Prevention & Chronic Care Immunizations   Influenza vaccine: Fluvax MCR  (10/16/2008)    Tetanus booster: Not documented    Pneumococcal vaccine: Not documented  Colorectal Screening   Hemoccult: Not documented    Colonoscopy: Not documented   Colonoscopy action/deferral: GI referral  (01/18/2009)  Other Screening   PSA: Not documented   PSA action/deferral: Discussed-decision deferred  (01/18/2009)   Smoking status: current  (02/18/2009)   Smoking cessation counseling: yes  (02/18/2009)  Diabetes Mellitus   HgbA1C: 6.2  (02/18/2009)    Eye exam: No diabetic retinopathy.     (03/09/2008)   Diabetic eye exam action/deferral: Ophthalmology referral  (01/18/2009)   Eye exam due: 03/2009    Foot exam: yes  (02/10/2008)   High risk foot: No  (02/10/2008)   Foot care education: Not documented    Urine microalbumin/creatinine ratio: 97.6  (02/10/2008)    Diabetes flowsheet reviewed?: Yes   Progress toward A1C goal: At goal  Lipids   Total  Cholesterol: 198  (01/19/2009)   Lipid panel action/deferral: Lipid Panel ordered   LDL: 87  (01/19/2009)   LDL Direct: Not documented   HDL: 34  (01/19/2009)   Triglycerides: 386  (01/19/2009)    SGOT (AST): 26  (01/19/2009)   BMP action: Ordered   SGPT (ALT): 29  (01/19/2009)   Alkaline phosphatase: 64  (01/19/2009)   Total bilirubin: 0.5  (01/19/2009)    Lipid flowsheet reviewed?: Yes   Progress toward LDL goal: At goal  Hypertension   Last Blood Pressure: 126 / 72  (02/18/2009)   Serum creatinine: 0.91  (01/19/2009)   BMP action: Ordered   Serum potassium 4.9  (01/19/2009)    Hypertension flowsheet reviewed?: Yes   Progress toward BP goal: At goal  Self-Management Support :    Patient will work on the following items until the next clinic visit to reach self-care goals:     Medications and monitoring: take my medicines every day, examine my feet every day  (02/18/2009)     Eating: eat more vegetables, use fresh or frozen vegetables, eat fruit for snacks and desserts, limit or avoid alcohol  (02/18/2009)     Activity: take a 30 minute walk every day  (02/18/2009)    Diabetes self-management support: Written self-care plan  (02/18/2009)   Diabetes care plan printed    Hypertension self-management support:  Written self-care plan  (02/18/2009)   Hypertension self-care plan printed.    Lipid self-management support: Written self-care plan  (02/18/2009)   Lipid self-care plan printed.   Laboratory Results   Blood Tests   Date/Time Received: February 18, 2009 4:07 PM Date/Time Reported: Burke Keels  February 18, 2009 4:07 PM   HGBA1C: 6.2%   (Normal Range: Non-Diabetic - 3-6%   Control Diabetic - 6-8%) CBG Random:: 107mg /dL

## 2010-02-11 NOTE — Procedures (Signed)
Summary: Request to hold Plavix prior to procedure/[facility]  Request to hold Plavix prior to procedure/[facility]   Imported By: Sherian Rein 02/26/2009 11:45:48  _____________________________________________________________________  External Attachment:    Type:   Image     Comment:   External Document

## 2010-02-11 NOTE — Progress Notes (Signed)
Summary: Dressing Changes  Phone Note Other Incoming   Caller: [facility] Nurse Summary of Call: Pt walked in this am stating that he was told that there was a fund her in the Clinics to cover his dressing change material.  t was informed that Dr. Onalee Hua would need to be consulted about having his wound changes done through Home Health.  Pt had previously had this done by Home Health.  Order given verbally to resume Home Health visits to pt for dressing changes per Dr. Onalee Hua. Angelina Ok RN  August 30, 2009 11:10 AM Call to [facility] to order Wound Care for pt. .  Written order to be sent to [facility]. Angelina Ok RN  August 30, 2009 11:59 AM Call from Nurse who will be going out to see pt on tomorrow.  Asked for clarification as to what his wound was and if he had just been discharged from the hospital.  Nurse to follow previous protocol to assess and to treat wound.Angelina Ok RN  August 30, 2009 4:27 PM  Initial call taken by: Angelina Ok RN,  August 30, 2009 4:28 PM  Follow-up for Phone Call        Thanks Follow-up by: Mariea Stable MD,  September 02, 2009 8:38 AM

## 2010-02-11 NOTE — Letter (Signed)
Summary: DIABETIC EYE EXAMINATION REPORT  DIABETIC EYE EXAMINATION REPORT   Imported By: Margie Billet 05/27/2009 10:15:38  _____________________________________________________________________  External Attachment:    Type:   Image     Comment:   External Document  Appended Document: DIABETIC EYE EXAMINATION REPORT    Clinical Lists Changes  Observations: Added new observation of [id number]: 05/2010 (05/28/2009 15:24) Added new observation of DIAB EYE EX: Intraocular pressure OD:     13 Intraocular pressure OS:     11 Visual acuity OD (best corrected):     20/25 Visual acuity OS (best corrected):     20/20 No diabetic retinopathy.    (05/01/2009 15:26)         Diabetic Eye Exam  Procedure date:  05/01/2009  Findings:      Intraocular pressure OD:     13 Intraocular pressure OS:     11 Visual acuity OD (best corrected):     20/25 Visual acuity OS (best corrected):     20/20 No diabetic retinopathy.     Procedures Next Due Date:    Diabetic Eye Exam: 05/2010   Diabetic Eye Exam  Procedure date:  05/01/2009  Findings:      Intraocular pressure OD:     13 Intraocular pressure OS:     11 Visual acuity OD (best corrected):     20/25 Visual acuity OS (best corrected):     20/20 No diabetic retinopathy.     Procedures Next Due Date:    Diabetic Eye Exam: 05/2010

## 2010-02-11 NOTE — Miscellaneous (Signed)
   The following letter is to inform to Dr Corliss Skains that I saw [name] for evaluation of fever on March 25 -2011. I think that [name] had a viral gastroenteritis. He was having fever, and diarrhea for two days. Patient relates no more episodes of fever. Patient has had sinnus congestion and chronic cough that has improved. I ordered a chest Xray tha was negative for PNA. I think that is ok to proceed with the procedure. I advised patient to informed us if he developed fever or worsening cough, to prescribed him antibiotics course. Thank you.   Hartley Barefoot, MD.

## 2010-02-13 NOTE — Consult Note (Signed)
[facility]   Imported By: Margie Billet 12/27/2009 10:27:14  _____________________________________________________________________  External Attachment:    Type:   Image     Comment:   External Document

## 2010-02-13 NOTE — Progress Notes (Signed)
Summary: Refill/gh  Phone Note Refill Request Message from:  Fax from Pharmacy on January 08, 2010 10:47 AM  Refills Requested: Medication #1:  LIPITOR 40 MG TABS Take 1 tab by mouth at bedtime   Last Refilled: 08/29/2009 Last labs were 05/27/2009.  Last office visit was 09/09/2009.   Method Requested: Fax to Local Pharmacy Initial call taken by: Angelina Ok RN,  January 08, 2010 10:47 AM  Follow-up for Phone Call        Refill approved-nurse to complete Follow-up by: Julaine Fusi  DO,  January 08, 2010 11:16 AM    Prescriptions: LIPITOR 40 MG TABS (ATORVASTATIN CALCIUM) Take 1 tab by mouth at bedtime  #90 x 0   Entered and Authorized by:   Julaine Fusi  DO   Signed by:   Julaine Fusi  DO on 01/08/2010   Method used:   Faxed to ...       [facility] Department (retail)       [address] [place], [place]  [address]       Ph: [phone]       Fax: [phone]   RxID:   [id number]

## 2010-02-13 NOTE — Progress Notes (Signed)
Summary: refill/ hla  Phone Note Refill Request Message from:  Fax from Pharmacy on January 14, 2010 9:29 AM  Refills Requested: Medication #1:  PLAVIX 75 MG TABS Take 1 tablet by mouth once a day   Dosage confirmed as above?Dosage Confirmed   Last Refilled: 9/16 #90 each fill last visit 08/2009 last labs 05/2009  Initial call taken by: Marin Roberts RN,  January 14, 2010 9:30 AM  Follow-up for Phone Call        Refill approved-nurse to complete Follow-up by: Mariea Stable MD,  January 15, 2010 11:33 AM  Additional Follow-up for Phone Call Additional follow up Details #1::        Rx called to pharmacy Additional Follow-up by: Marin Roberts RN,  January 21, 2010 3:55 PM    Prescriptions: PLAVIX 75 MG TABS (CLOPIDOGREL BISULFATE) Take 1 tablet by mouth once a day  #90 x 0   Entered and Authorized by:   Mariea Stable MD   Signed by:   Mariea Stable MD on 01/15/2010   Method used:   Telephoned to ...       [facility] Department (retail)       [address] [place], [place]  [address]       Ph: [phone]       Fax: [phone]   RxID:   [id number]

## 2010-02-13 NOTE — Progress Notes (Signed)
Summary: viagra/ hla  Phone Note Call from Patient   Summary of Call: pt calls and request that his viagra be delivered to clinic, he is informed that it is not possible to do that due to not having pt assistance to store med secured and someone to dispense, he is ask to call [facility] and ask exactly how to have it delivered to his home Initial call taken by: Marin Roberts RN,  February 06, 2010 12:33 PM

## 2010-02-13 NOTE — Consult Note (Signed)
Summary: [facility]   Imported By: Louretta Parma 01/09/2010 16:31:20  _____________________________________________________________________  External Attachment:    Type:   Image     Comment:   External Document

## 2010-02-13 NOTE — Consult Note (Signed)
Summary: [facility]   Imported By: Louretta Parma 11/29/2009 16:16:15  _____________________________________________________________________  External Attachment:    Type:   Image     Comment:   External Document

## 2010-02-28 ENCOUNTER — Telehealth: Payer: Self-pay | Admitting: Internal Medicine

## 2010-02-28 ENCOUNTER — Emergency Department (HOSPITAL_COMMUNITY)
Admission: EM | Admit: 2010-02-28 | Discharge: 2010-03-01 | Disposition: A | Discharge status: Home / Self Care | Payer: Self-pay | Attending: Emergency Medicine | Admitting: Emergency Medicine

## 2010-02-28 DIAGNOSIS — E7800 Pure hypercholesterolemia, unspecified: Secondary | ICD-10-CM | POA: Insufficient documentation

## 2010-02-28 DIAGNOSIS — R109 Unspecified abdominal pain: Secondary | ICD-10-CM | POA: Insufficient documentation

## 2010-02-28 DIAGNOSIS — E119 Type 2 diabetes mellitus without complications: Secondary | ICD-10-CM | POA: Insufficient documentation

## 2010-02-28 DIAGNOSIS — J45909 Unspecified asthma, uncomplicated: Secondary | ICD-10-CM | POA: Insufficient documentation

## 2010-02-28 DIAGNOSIS — R197 Diarrhea, unspecified: Secondary | ICD-10-CM | POA: Insufficient documentation

## 2010-02-28 DIAGNOSIS — R509 Fever, unspecified: Secondary | ICD-10-CM | POA: Insufficient documentation

## 2010-02-28 DIAGNOSIS — R112 Nausea with vomiting, unspecified: Secondary | ICD-10-CM | POA: Insufficient documentation

## 2010-02-28 LAB — GLUCOSE, CAPILLARY: Glucose-Capillary: 155 mg/dL — ABNORMAL HIGH (ref 70–99)

## 2010-02-28 NOTE — Telephone Encounter (Signed)
Please refer to phone note documentation.

## 2010-03-01 ENCOUNTER — Emergency Department (HOSPITAL_COMMUNITY): Payer: Self-pay

## 2010-03-01 LAB — URINALYSIS, ROUTINE W REFLEX MICROSCOPIC
Ketones, ur: NEGATIVE mg/dL
Nitrite: NEGATIVE
Specific Gravity, Urine: 1.026 (ref 1.005–1.030)
pH: 5.5 (ref 5.0–8.0)

## 2010-03-01 LAB — COMPREHENSIVE METABOLIC PANEL
Alkaline Phosphatase: 144 U/L — ABNORMAL HIGH (ref 39–117)
BUN: 22 mg/dL (ref 6–23)
Calcium: 9.3 mg/dL (ref 8.4–10.5)
Creatinine, Ser: 0.95 mg/dL (ref 0.4–1.5)
Glucose, Bld: 127 mg/dL — ABNORMAL HIGH (ref 70–99)
Total Protein: 7.8 g/dL (ref 6.0–8.3)

## 2010-03-01 LAB — LIPASE, BLOOD: Lipase: 33 U/L (ref 11–59)

## 2010-03-01 LAB — DIFFERENTIAL
Eosinophils Absolute: 0.8 10*3/uL — ABNORMAL HIGH (ref 0.0–0.7)
Eosinophils Relative: 7 % — ABNORMAL HIGH (ref 0–5)
Lymphocytes Relative: 28 % (ref 12–46)
Lymphs Abs: 3 10*3/uL (ref 0.7–4.0)
Monocytes Absolute: 1 10*3/uL (ref 0.1–1.0)

## 2010-03-01 LAB — CBC
HCT: 46.4 % (ref 39.0–52.0)
MCH: 30.9 pg (ref 26.0–34.0)
MCHC: 34.3 g/dL (ref 30.0–36.0)
MCV: 90.1 fL (ref 78.0–100.0)
Platelets: 224 10*3/uL (ref 150–400)
RDW: 14.3 % (ref 11.5–15.5)

## 2010-03-01 MED ORDER — IOHEXOL 300 MG/ML  SOLN: 100.0000 mL | Freq: Once | INTRAMUSCULAR | Status: DC | PRN

## 2010-03-05 ENCOUNTER — Encounter: Payer: Self-pay | Admitting: Internal Medicine

## 2010-03-07 ENCOUNTER — Telehealth: Payer: Self-pay | Admitting: *Deleted

## 2010-03-07 NOTE — Telephone Encounter (Signed)
Pt calls and states he needs "everything done to get him an appt at [facility] for his sex problems" i called him back and he states he wants to go to [facility] to see an expert on E.D., i explained that he must be seen in clinic before he can go to [facility]. He was transferred to [facility] for a change in appts if possible

## 2010-03-13 ENCOUNTER — Other Ambulatory Visit: Payer: Self-pay | Admitting: Internal Medicine

## 2010-03-25 ENCOUNTER — Encounter: Payer: Self-pay | Admitting: Internal Medicine

## 2010-03-25 LAB — BUN: BUN: 14 mg/dL (ref 6–23)

## 2010-03-25 LAB — CREATININE, SERUM: Creatinine, Ser: 0.85 mg/dL (ref 0.4–1.5)

## 2010-03-26 LAB — COMPREHENSIVE METABOLIC PANEL
Albumin: 3.8 g/dL (ref 3.5–5.2)
BUN: 12 mg/dL (ref 6–23)
CO2: 29 mEq/L (ref 19–32)
Chloride: 107 mEq/L (ref 96–112)
Creatinine, Ser: 0.96 mg/dL (ref 0.4–1.5)
GFR calc non Af Amer: >60 mL/min (ref >60)
Total Bilirubin: 0.5 mg/dL (ref 0.3–1.2)

## 2010-03-26 LAB — VITAMIN D 1,25 DIHYDROXY
Vitamin D 1, 25 (OH)2 Total: 23 pg/mL (ref 18–72)
Vitamin D3 1, 25 (OH)2: 23 pg/mL

## 2010-03-26 LAB — GLUCOSE, CAPILLARY
Glucose-Capillary: 114 mg/dL — ABNORMAL HIGH (ref 70–99)
Glucose-Capillary: 120 mg/dL — ABNORMAL HIGH (ref 70–99)
Glucose-Capillary: 129 mg/dL — ABNORMAL HIGH (ref 70–99)
Glucose-Capillary: 94 mg/dL (ref 70–99)
Glucose-Capillary: 94 mg/dL (ref 70–99)

## 2010-03-26 LAB — CBC
MCH: 30.9 pg (ref 26.0–34.0)
MCV: 91 fL (ref 78.0–100.0)
Platelets: 253 10*3/uL (ref 150–400)
RBC: 4.44 MIL/uL (ref 4.22–5.81)

## 2010-03-26 LAB — MISCELLANEOUS TEST

## 2010-03-26 LAB — HEMOGLOBIN A1C
Hgb A1c MFr Bld: 5.9 % — ABNORMAL HIGH (ref <5.7)
Mean Plasma Glucose: 123 mg/dL — ABNORMAL HIGH (ref <117)

## 2010-03-26 LAB — DIFFERENTIAL
Basophils Absolute: 0 10*3/uL (ref 0.0–0.1)
Basophils Relative: 1 % (ref 0–1)
Neutro Abs: 4.4 10*3/uL (ref 1.7–7.7)
Neutrophils Relative %: 53 % (ref 43–77)

## 2010-03-27 LAB — GLUCOSE, CAPILLARY
Glucose-Capillary: 108 mg/dL — ABNORMAL HIGH (ref 70–99)
Glucose-Capillary: 128 mg/dL — ABNORMAL HIGH (ref 70–99)

## 2010-03-27 LAB — C-REACTIVE PROTEIN: CRP: 0.9 mg/dL — ABNORMAL HIGH (ref <0.6)

## 2010-03-27 LAB — SEDIMENTATION RATE: Sed Rate: 17 mm/hr — ABNORMAL HIGH (ref 0–16)

## 2010-03-28 ENCOUNTER — Other Ambulatory Visit (HOSPITAL_COMMUNITY): Payer: Self-pay | Admitting: Interventional Radiology

## 2010-03-28 DIAGNOSIS — I771 Stricture of artery: Secondary | ICD-10-CM

## 2010-03-28 LAB — GLUCOSE, CAPILLARY
Glucose-Capillary: 127 mg/dL — ABNORMAL HIGH (ref 70–99)
Glucose-Capillary: 130 mg/dL — ABNORMAL HIGH (ref 70–99)
Glucose-Capillary: 130 mg/dL — ABNORMAL HIGH (ref 70–99)

## 2010-03-28 LAB — URINALYSIS, ROUTINE W REFLEX MICROSCOPIC
Glucose, UA: NEGATIVE mg/dL
Ketones, ur: NEGATIVE mg/dL
pH: 5.5 (ref 5.0–8.0)

## 2010-03-28 LAB — COMPREHENSIVE METABOLIC PANEL
Alkaline Phosphatase: 92 U/L (ref 39–117)
BUN: 11 mg/dL (ref 6–23)
Glucose, Bld: 98 mg/dL (ref 70–99)
Potassium: 4.9 mEq/L (ref 3.5–5.1)
Total Protein: 7.7 g/dL (ref 6.0–8.3)

## 2010-03-28 LAB — DIFFERENTIAL
Basophils Absolute: 0.1 10*3/uL (ref 0.0–0.1)
Eosinophils Absolute: 0.9 10*3/uL — ABNORMAL HIGH (ref 0.0–0.7)
Eosinophils Relative: 10 % — ABNORMAL HIGH (ref 0–5)
Lymphocytes Relative: 31 % (ref 12–46)
Neutrophils Relative %: 49 % (ref 43–77)

## 2010-03-28 LAB — WOUND CULTURE: Culture: NO GROWTH

## 2010-03-28 LAB — ANAEROBIC CULTURE

## 2010-03-28 LAB — BASIC METABOLIC PANEL
BUN: 12 mg/dL (ref 6–23)
CO2: 27 mEq/L (ref 19–32)
Chloride: 103 mEq/L (ref 96–112)
Creatinine, Ser: 1.05 mg/dL (ref 0.4–1.5)
Glucose, Bld: 163 mg/dL — ABNORMAL HIGH (ref 70–99)

## 2010-03-28 LAB — URINE CULTURE

## 2010-03-28 LAB — CBC
HCT: 43.8 % (ref 39.0–52.0)
MCH: 30.4 pg (ref 26.0–34.0)
MCH: 30.6 pg (ref 26.0–34.0)
MCHC: 33.1 g/dL (ref 30.0–36.0)
MCHC: 34.9 g/dL (ref 30.0–36.0)
MCV: 87.6 fL (ref 78.0–100.0)
MCV: 92 fL (ref 78.0–100.0)
Platelets: 255 10*3/uL (ref 150–400)
RDW: 14.3 % (ref 11.5–15.5)
RDW: 14.4 % (ref 11.5–15.5)

## 2010-03-28 LAB — C-REACTIVE PROTEIN: CRP: 0.7 mg/dL — ABNORMAL HIGH (ref <0.6)

## 2010-03-28 LAB — PROTIME-INR: INR: 0.93 (ref 0.00–1.49)

## 2010-03-28 LAB — TYPE AND SCREEN

## 2010-03-28 LAB — BILIRUBIN, DIRECT: Bilirubin, Direct: 0.2 mg/dL (ref 0.0–0.3)

## 2010-03-30 LAB — GLUCOSE, CAPILLARY
Glucose-Capillary: 109 mg/dL — ABNORMAL HIGH (ref 70–99)
Glucose-Capillary: 115 mg/dL — ABNORMAL HIGH (ref 70–99)
Glucose-Capillary: 146 mg/dL — ABNORMAL HIGH (ref 70–99)

## 2010-03-30 LAB — POCT I-STAT, CHEM 8
Creatinine, Ser: 0.8 mg/dL (ref 0.4–1.5)
Hemoglobin: 13.3 g/dL (ref 13.0–17.0)
Sodium: 141 mEq/L (ref 135–145)
TCO2: 23 mmol/L (ref 0–100)

## 2010-03-31 LAB — GLUCOSE, CAPILLARY
Glucose-Capillary: 112 mg/dL — ABNORMAL HIGH (ref 70–99)
Glucose-Capillary: 115 mg/dL — ABNORMAL HIGH (ref 70–99)
Glucose-Capillary: 116 mg/dL — ABNORMAL HIGH (ref 70–99)
Glucose-Capillary: 125 mg/dL — ABNORMAL HIGH (ref 70–99)
Glucose-Capillary: 128 mg/dL — ABNORMAL HIGH (ref 70–99)
Glucose-Capillary: 139 mg/dL — ABNORMAL HIGH (ref 70–99)
Glucose-Capillary: 149 mg/dL — ABNORMAL HIGH (ref 70–99)
Glucose-Capillary: 175 mg/dL — ABNORMAL HIGH (ref 70–99)
Glucose-Capillary: 97 mg/dL (ref 70–99)
Glucose-Capillary: 97 mg/dL (ref 70–99)

## 2010-03-31 LAB — CK TOTAL AND CKMB (NOT AT ARMC): CK, MB: 2.8 ng/mL (ref 0.3–4.0)

## 2010-03-31 LAB — COMPREHENSIVE METABOLIC PANEL
ALT: 23 U/L (ref 0–53)
Albumin: 3.8 g/dL (ref 3.5–5.2)
Alkaline Phosphatase: 46 U/L (ref 39–117)
Alkaline Phosphatase: 68 U/L (ref 39–117)
BUN: 14 mg/dL (ref 6–23)
CO2: 26 mEq/L (ref 19–32)
CO2: 27 mEq/L (ref 19–32)
Chloride: 109 mEq/L (ref 96–112)
GFR calc non Af Amer: >60 mL/min (ref >60)
Glucose, Bld: 114 mg/dL — ABNORMAL HIGH (ref 70–99)
Potassium: 3.9 mEq/L (ref 3.5–5.1)
Potassium: 3.9 mEq/L (ref 3.5–5.1)
Sodium: 135 mEq/L (ref 135–145)
Total Bilirubin: 0.7 mg/dL (ref 0.3–1.2)
Total Protein: 5.8 g/dL — ABNORMAL LOW (ref 6.0–8.3)

## 2010-03-31 LAB — DIFFERENTIAL
Basophils Absolute: 0.1 10*3/uL (ref 0.0–0.1)
Basophils Absolute: 0.1 10*3/uL (ref 0.0–0.1)
Basophils Relative: 1 % (ref 0–1)
Basophils Relative: 1 % (ref 0–1)
Eosinophils Absolute: 0.9 10*3/uL — ABNORMAL HIGH (ref 0.0–0.7)
Eosinophils Relative: 6 % — ABNORMAL HIGH (ref 0–5)
Lymphs Abs: 2.7 10*3/uL (ref 0.7–4.0)
Monocytes Absolute: 0.5 10*3/uL (ref 0.1–1.0)
Neutro Abs: 4.8 10*3/uL (ref 1.7–7.7)
Neutrophils Relative %: 52 % (ref 43–77)

## 2010-03-31 LAB — CBC
HCT: 29.3 % — ABNORMAL LOW (ref 39.0–52.0)
HCT: 42.8 % (ref 39.0–52.0)
Hemoglobin: 10.4 g/dL — ABNORMAL LOW (ref 13.0–17.0)
Hemoglobin: 10.8 g/dL — ABNORMAL LOW (ref 13.0–17.0)
Hemoglobin: 15 g/dL (ref 13.0–17.0)
Hemoglobin: 9.8 g/dL — ABNORMAL LOW (ref 13.0–17.0)
MCHC: 35 g/dL (ref 30.0–36.0)
MCHC: 35.8 g/dL (ref 30.0–36.0)
MCV: 92.1 fL (ref 78.0–100.0)
MCV: 95.1 fL (ref 78.0–100.0)
MCV: 95.5 fL (ref 78.0–100.0)
Platelets: 259 10*3/uL (ref 150–400)
Platelets: 171 10*3/uL (ref 150–400)
Platelets: 237 10*3/uL (ref 150–400)
RBC: 2.88 MIL/uL — ABNORMAL LOW (ref 4.22–5.81)
RBC: 3.19 MIL/uL — ABNORMAL LOW (ref 4.22–5.81)
RBC: 4.5 MIL/uL (ref 4.22–5.81)
WBC: 8.7 10*3/uL (ref 4.0–10.5)
WBC: 7.7 10*3/uL (ref 4.0–10.5)
WBC: 8.9 10*3/uL (ref 4.0–10.5)

## 2010-03-31 LAB — BASIC METABOLIC PANEL
BUN: 11 mg/dL (ref 6–23)
BUN: 10 mg/dL (ref 6–23)
BUN: 5 mg/dL — ABNORMAL LOW (ref 6–23)
CO2: 27 mEq/L (ref 19–32)
Chloride: 105 mEq/L (ref 96–112)
Chloride: 101 mEq/L (ref 96–112)
Chloride: 102 mEq/L (ref 96–112)
Chloride: 106 mEq/L (ref 96–112)
Creatinine, Ser: 0.97 mg/dL (ref 0.4–1.5)
GFR calc Af Amer: >60 mL/min (ref >60)
Potassium: 3.7 mEq/L (ref 3.5–5.1)
Potassium: 4 mEq/L (ref 3.5–5.1)
Sodium: 136 mEq/L (ref 135–145)
Sodium: 137 mEq/L (ref 135–145)

## 2010-03-31 LAB — PREPARE PLATELETS

## 2010-03-31 LAB — RAPID URINE DRUG SCREEN, HOSP PERFORMED
Barbiturates: NOT DETECTED
Opiates: POSITIVE — AB

## 2010-03-31 LAB — TROPONIN I: Troponin I: 0.01 ng/mL (ref 0.00–0.06)

## 2010-03-31 LAB — TYPE AND SCREEN: ABO/RH(D): A POS

## 2010-03-31 LAB — MICROALBUMIN / CREATININE URINE RATIO: Microalb Creat Ratio: 26.3 mg/g (ref 0.0–30.0)

## 2010-03-31 LAB — MRSA PCR SCREENING: MRSA by PCR: NEGATIVE

## 2010-03-31 LAB — LIPID PANEL
Cholesterol: 108 mg/dL (ref 0–200)
Total CHOL/HDL Ratio: 3.4 RATIO

## 2010-04-01 LAB — GLUCOSE, CAPILLARY: Glucose-Capillary: 119 mg/dL — ABNORMAL HIGH (ref 70–99)

## 2010-04-02 ENCOUNTER — Encounter (HOSPITAL_COMMUNITY)
Admission: RE | Admit: 2010-04-02 | Discharge: 2010-04-02 | Disposition: A | Discharge status: Home / Self Care | Payer: Medicaid Other | Source: Ambulatory Visit | Attending: Interventional Radiology | Admitting: Interventional Radiology

## 2010-04-02 ENCOUNTER — Ambulatory Visit (HOSPITAL_COMMUNITY)
Admission: RE | Admit: 2010-04-02 | Discharge: 2010-04-02 | Disposition: A | Discharge status: Home / Self Care | Payer: Medicaid Other | Source: Ambulatory Visit | Attending: Interventional Radiology | Admitting: Interventional Radiology

## 2010-04-02 ENCOUNTER — Other Ambulatory Visit (HOSPITAL_COMMUNITY): Payer: Self-pay | Admitting: Interventional Radiology

## 2010-04-02 ENCOUNTER — Ambulatory Visit (INDEPENDENT_AMBULATORY_CARE_PROVIDER_SITE_OTHER): Payer: Medicaid Other | Admitting: Internal Medicine

## 2010-04-02 ENCOUNTER — Encounter: Payer: Self-pay | Admitting: Internal Medicine

## 2010-04-02 DIAGNOSIS — I679 Cerebrovascular disease, unspecified: Secondary | ICD-10-CM

## 2010-04-02 DIAGNOSIS — Z8679 Personal history of other diseases of the circulatory system: Secondary | ICD-10-CM

## 2010-04-02 DIAGNOSIS — F411 Generalized anxiety disorder: Secondary | ICD-10-CM

## 2010-04-02 DIAGNOSIS — I739 Peripheral vascular disease, unspecified: Secondary | ICD-10-CM

## 2010-04-02 DIAGNOSIS — Z01818 Encounter for other preprocedural examination: Secondary | ICD-10-CM | POA: Insufficient documentation

## 2010-04-02 DIAGNOSIS — E785 Hyperlipidemia, unspecified: Secondary | ICD-10-CM

## 2010-04-02 DIAGNOSIS — M5126 Other intervertebral disc displacement, lumbar region: Secondary | ICD-10-CM

## 2010-04-02 DIAGNOSIS — G4733 Obstructive sleep apnea (adult) (pediatric): Secondary | ICD-10-CM

## 2010-04-02 DIAGNOSIS — Z8619 Personal history of other infectious and parasitic diseases: Secondary | ICD-10-CM

## 2010-04-02 DIAGNOSIS — E119 Type 2 diabetes mellitus without complications: Secondary | ICD-10-CM

## 2010-04-02 DIAGNOSIS — I10 Essential (primary) hypertension: Secondary | ICD-10-CM

## 2010-04-02 DIAGNOSIS — Z01812 Encounter for preprocedural laboratory examination: Secondary | ICD-10-CM | POA: Insufficient documentation

## 2010-04-02 DIAGNOSIS — J449 Chronic obstructive pulmonary disease, unspecified: Secondary | ICD-10-CM

## 2010-04-02 DIAGNOSIS — F528 Other sexual dysfunction not due to a substance or known physiological condition: Secondary | ICD-10-CM

## 2010-04-02 LAB — BASIC METABOLIC PANEL
BUN: 14 mg/dL (ref 6–23)
Chloride: 106 mEq/L (ref 96–112)
Creatinine, Ser: 1 mg/dL (ref 0.4–1.5)
Glucose, Bld: 148 mg/dL — ABNORMAL HIGH (ref 70–99)
Potassium: 4 mEq/L (ref 3.5–5.1)

## 2010-04-02 LAB — DIFFERENTIAL
Basophils Absolute: 0.1 10*3/uL (ref 0.0–0.1)
Basophils Relative: 1 % (ref 0–1)
Eosinophils Absolute: 0.7 10*3/uL (ref 0.0–0.7)
Eosinophils Absolute: 0.7 10*3/uL (ref 0.0–0.7)
Eosinophils Relative: 9 % — ABNORMAL HIGH (ref 0–5)
Eosinophils Relative: 8 % — ABNORMAL HIGH (ref 0–5)
Lymphocytes Relative: 33 % (ref 12–46)
Lymphs Abs: 2.7 10*3/uL (ref 0.7–4.0)
Lymphs Abs: 2.9 10*3/uL (ref 0.7–4.0)
Monocytes Absolute: 0.7 10*3/uL (ref 0.1–1.0)
Monocytes Relative: 9 % (ref 3–12)
Neutrophils Relative %: 49 % (ref 43–77)

## 2010-04-02 LAB — CBC
HCT: 45.5 % (ref 39.0–52.0)
HCT: 44.1 % (ref 39.0–52.0)
MCH: 31.6 pg (ref 26.0–34.0)
MCV: 89.2 fL (ref 78.0–100.0)
MCV: 95.2 fL (ref 78.0–100.0)
Platelets: 248 10*3/uL (ref 150–400)
Platelets: 227 10*3/uL (ref 150–400)
RDW: 14.1 % (ref 11.5–15.5)
RDW: 13.2 % (ref 11.5–15.5)
WBC: 8.3 10*3/uL (ref 4.0–10.5)

## 2010-04-02 LAB — COMPREHENSIVE METABOLIC PANEL
AST: 29 U/L (ref 0–37)
Albumin: 4.2 g/dL (ref 3.5–5.2)
Alkaline Phosphatase: 154 U/L — ABNORMAL HIGH (ref 39–117)
BUN: 20 mg/dL (ref 6–23)
CO2: 24 mEq/L (ref 19–32)
Chloride: 104 mEq/L (ref 96–112)
Creatinine, Ser: 1.08 mg/dL (ref 0.4–1.5)
GFR calc non Af Amer: >60 mL/min (ref >60)
Potassium: 4.5 mEq/L (ref 3.5–5.1)
Total Bilirubin: 0.5 mg/dL (ref 0.3–1.2)

## 2010-04-02 LAB — PROTIME-INR
INR: 1.06 (ref 0.00–1.49)
Prothrombin Time: 13.7 seconds (ref 11.6–15.2)

## 2010-04-02 LAB — GLUCOSE, CAPILLARY: Glucose-Capillary: 150 mg/dL — ABNORMAL HIGH (ref 70–99)

## 2010-04-02 LAB — SURGICAL PCR SCREEN: MRSA, PCR: NEGATIVE

## 2010-04-02 MED ORDER — SILDENAFIL CITRATE 100 MG PO TABS: 100.0000 mg | ORAL_TABLET | ORAL | Status: DC | PRN

## 2010-04-02 NOTE — Assessment & Plan Note (Addendum)
Needs  trial of auto-titrated CPAP at home based on this experience.  As an alternative, [facility] could [facility], perhaps bringing a sleep medication, for a formal CPAP titration study.  As another alternative, consider calling the [facility] staff during the daytime to request CPAP desensitization and reassessment.   Will discuss with Venita Sheffield to inquire on current issues.
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
## 2010-04-02 NOTE — Assessment & Plan Note (Signed)
Uses albuterol approx twice per month

## 2010-04-02 NOTE — Assessment & Plan Note (Signed)
On ASA, plavix and statin See separate risk factors

## 2010-04-02 NOTE — Assessment & Plan Note (Signed)
Elevated today but not taking atenolol or lisinopril...Marland KitchenMarland Kitchen Previously well controlled. States he could not afford the $5 payments/script but now as medicaid approved and will be restarting meds.

## 2010-04-02 NOTE — Assessment & Plan Note (Signed)
Trial of PDE inhibitors, reports they no longer work. Assume this is due to diffuse atherosclerotic disease.......  Please refer to PMH and HPI Will refer to urology to see if other options available.

## 2010-04-02 NOTE — Patient Instructions (Signed)
Please schedule follow up appointment in 3 months. Have put in Urology referral. Will check cholesterol next visit, so come in on empty stomach. Make sure to continue with your medications below. If you have any problems before your next visit, call clinic. Your A1c was great today, last cholesterol panel was great and Blood pressure has been good but elevated today because you have not taken the atenolol or lisinopril (need to restart both).

## 2010-04-02 NOTE — Assessment & Plan Note (Signed)
Well controlled. Continue with metformin 1 tab bid.

## 2010-04-02 NOTE — Assessment & Plan Note (Addendum)
LDL and TG at goal as of 05/2009...Marland KitchenMarland KitchenMarland Kitchen Recheck on follow up visit.  Continue with fibrate/statin combo LFTs wnl.

## 2010-04-04 ENCOUNTER — Other Ambulatory Visit: Payer: Self-pay | Admitting: *Deleted

## 2010-04-04 MED ORDER — PANTOPRAZOLE SODIUM 40 MG PO TBEC: 40.0000 mg | DELAYED_RELEASE_TABLET | Freq: Every day | ORAL | Status: DC

## 2010-04-04 MED ORDER — ALBUTEROL SULFATE HFA 108 (90 BASE) MCG/ACT IN AERS: 2.0000 | INHALATION_SPRAY | RESPIRATORY_TRACT | Status: DC | PRN

## 2010-04-04 MED ORDER — CLOPIDOGREL BISULFATE 75 MG PO TABS: 75.0000 mg | ORAL_TABLET | Freq: Every day | ORAL | Status: DC

## 2010-04-04 MED ORDER — ATORVASTATIN CALCIUM 40 MG PO TABS: 40.0000 mg | ORAL_TABLET | Freq: Every day | ORAL | Status: DC

## 2010-04-04 NOTE — Telephone Encounter (Signed)
I didn't fill sildenafil bc just saw Dr Onalee Hua and stated was not working and had just gotten a refill this month.

## 2010-04-07 LAB — GLUCOSE, CAPILLARY: Glucose-Capillary: 103 mg/dL — ABNORMAL HIGH (ref 70–99)

## 2010-04-07 LAB — CBC
HCT: 46.2 % (ref 39.0–52.0)
Hemoglobin: 15.8 g/dL (ref 13.0–17.0)
MCHC: 34.1 g/dL (ref 30.0–36.0)
MCV: 95.8 fL (ref 78.0–100.0)
Platelets: 209 K/uL (ref 150–400)
RBC: 4.82 MIL/uL (ref 4.22–5.81)
RDW: 13.3 % (ref 11.5–15.5)
WBC: 8.1 K/uL (ref 4.0–10.5)

## 2010-04-09 ENCOUNTER — Encounter: Payer: Medicaid Other | Admitting: Internal Medicine

## 2010-04-10 ENCOUNTER — Ambulatory Visit (HOSPITAL_COMMUNITY)
Admission: RE | Admit: 2010-04-10 | Discharge: 2010-04-10 | Disposition: A | Discharge status: Home / Self Care | Payer: Medicaid Other | Source: Ambulatory Visit | Attending: Interventional Radiology | Admitting: Interventional Radiology

## 2010-04-10 DIAGNOSIS — I10 Essential (primary) hypertension: Secondary | ICD-10-CM | POA: Insufficient documentation

## 2010-04-10 DIAGNOSIS — B1920 Unspecified viral hepatitis C without hepatic coma: Secondary | ICD-10-CM | POA: Insufficient documentation

## 2010-04-10 DIAGNOSIS — K449 Diaphragmatic hernia without obstruction or gangrene: Secondary | ICD-10-CM | POA: Insufficient documentation

## 2010-04-10 DIAGNOSIS — I6509 Occlusion and stenosis of unspecified vertebral artery: Secondary | ICD-10-CM | POA: Insufficient documentation

## 2010-04-10 DIAGNOSIS — I658 Occlusion and stenosis of other precerebral arteries: Secondary | ICD-10-CM | POA: Insufficient documentation

## 2010-04-10 DIAGNOSIS — I6529 Occlusion and stenosis of unspecified carotid artery: Secondary | ICD-10-CM | POA: Insufficient documentation

## 2010-04-10 DIAGNOSIS — I771 Stricture of artery: Secondary | ICD-10-CM

## 2010-04-10 DIAGNOSIS — J4489 Other specified chronic obstructive pulmonary disease: Secondary | ICD-10-CM | POA: Insufficient documentation

## 2010-04-10 DIAGNOSIS — J449 Chronic obstructive pulmonary disease, unspecified: Secondary | ICD-10-CM | POA: Insufficient documentation

## 2010-04-10 DIAGNOSIS — F17200 Nicotine dependence, unspecified, uncomplicated: Secondary | ICD-10-CM | POA: Insufficient documentation

## 2010-04-10 DIAGNOSIS — E119 Type 2 diabetes mellitus without complications: Secondary | ICD-10-CM | POA: Insufficient documentation

## 2010-04-10 MED ORDER — IOHEXOL 300 MG/ML  SOLN
200.0000 mL | Freq: Once | INTRAMUSCULAR | Status: AC | PRN
  Administered 2010-04-10: 80 mL via INTRA_ARTERIAL

## 2010-04-18 LAB — POCT I-STAT, CHEM 8
BUN: 6 mg/dL (ref 6–23)
Chloride: 106 mEq/L (ref 96–112)
Sodium: 141 mEq/L (ref 135–145)

## 2010-04-18 LAB — GLUCOSE, CAPILLARY: Glucose-Capillary: 106 mg/dL — ABNORMAL HIGH (ref 70–99)

## 2010-04-19 LAB — GLUCOSE, CAPILLARY
Glucose-Capillary: 182 mg/dL — ABNORMAL HIGH (ref 70–99)
Glucose-Capillary: 84 mg/dL (ref 70–99)

## 2010-04-19 IMAGING — CR DG LUMBAR SPINE 2-3V
3 series · 3 of 3 positions shown · non-contrast
Comparison: CT 06/05/2004

CLINICAL DATA: Left back pain

LUMBAR SPINE - 2-3 VIEW

[view not recorded (1 of 3)]
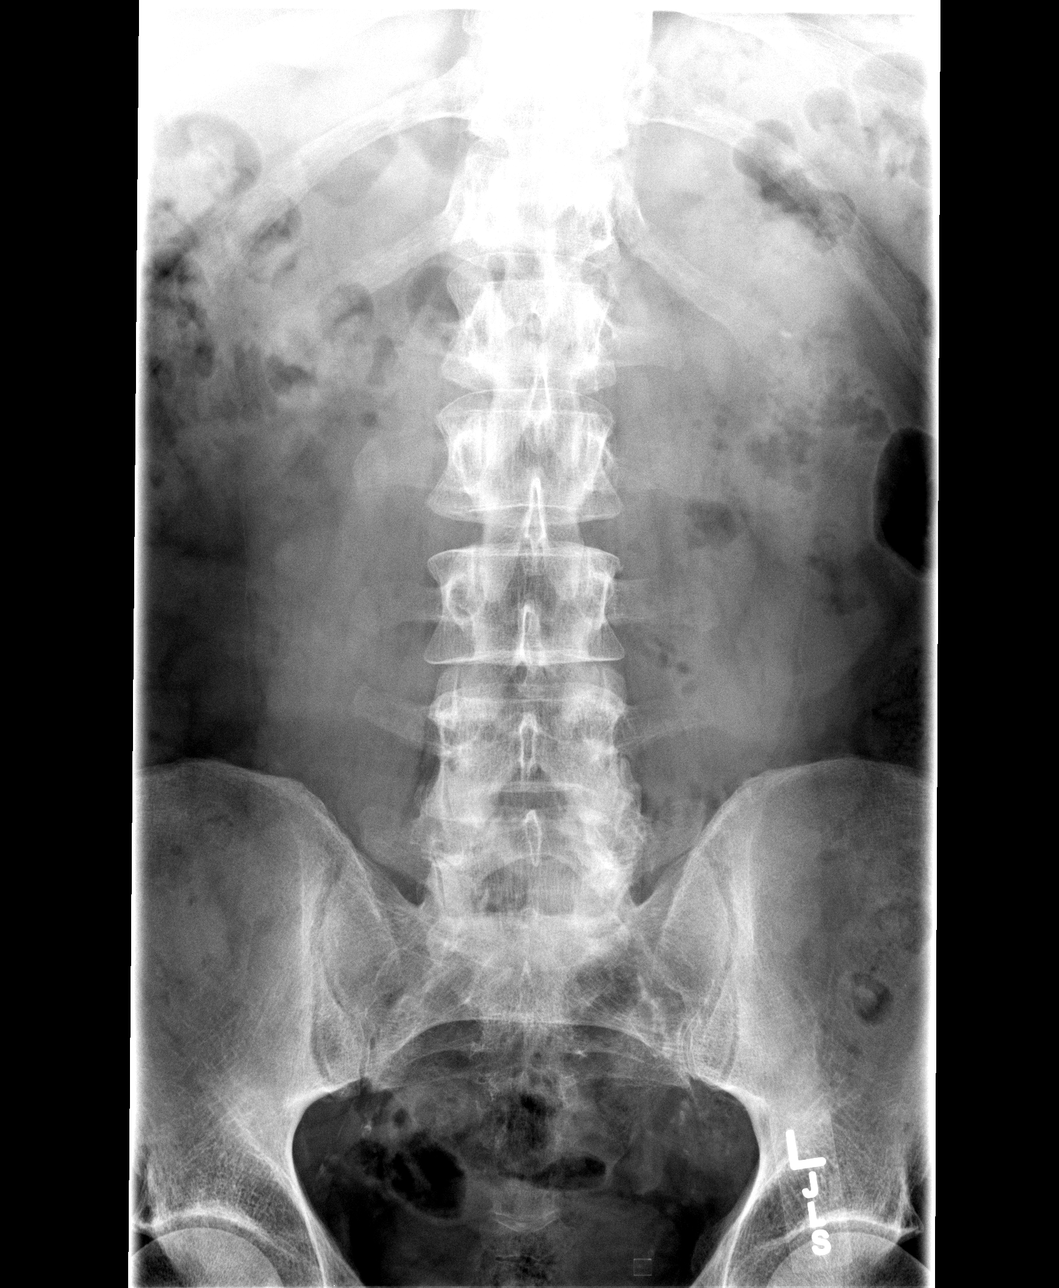

[view not recorded (2 of 3)]
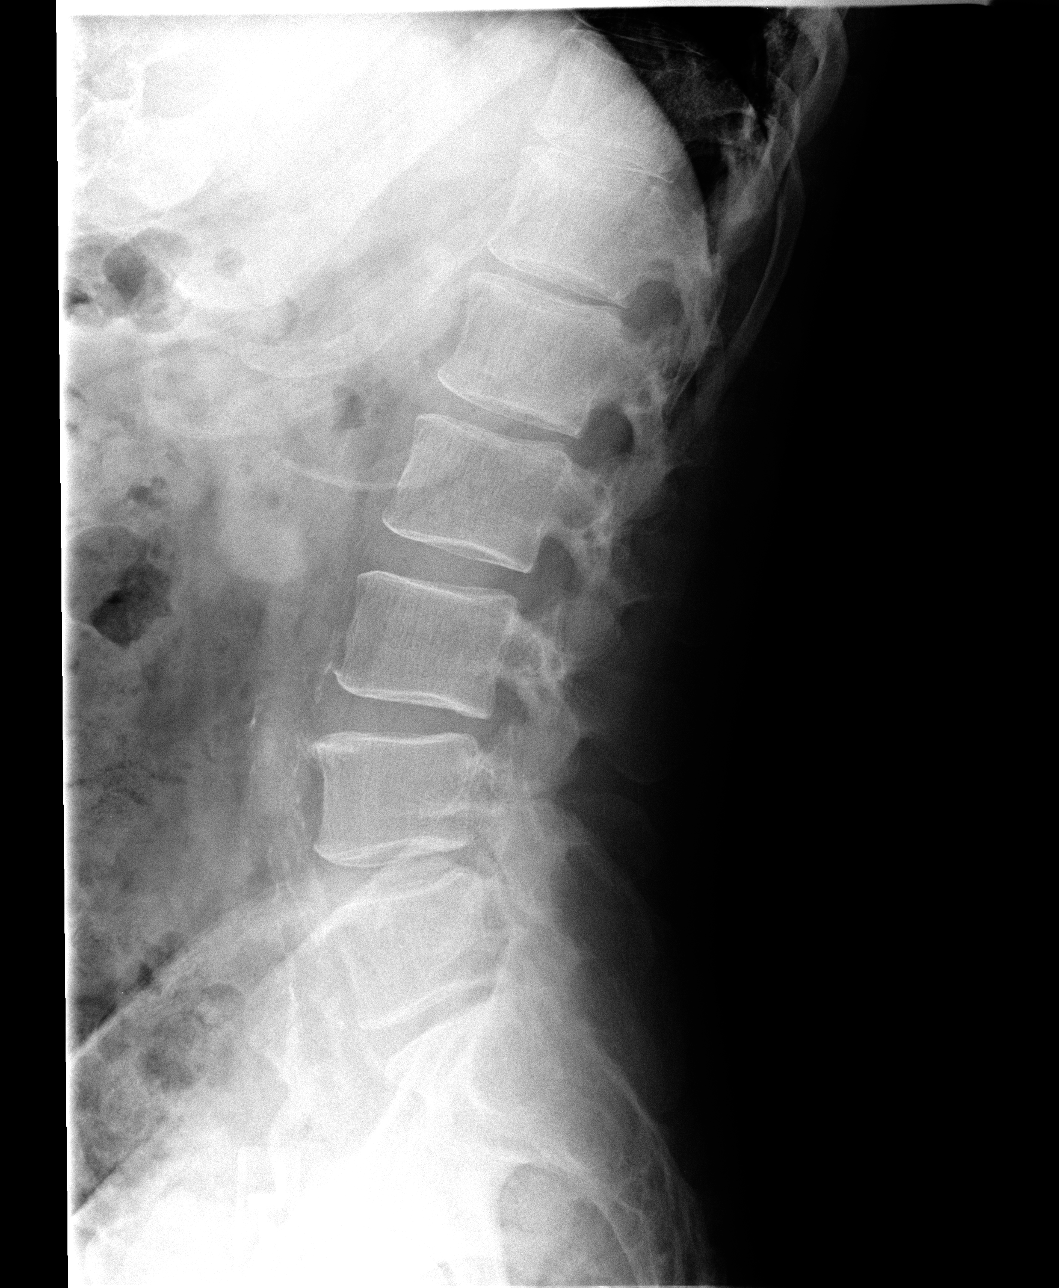

[view not recorded (3 of 3)]
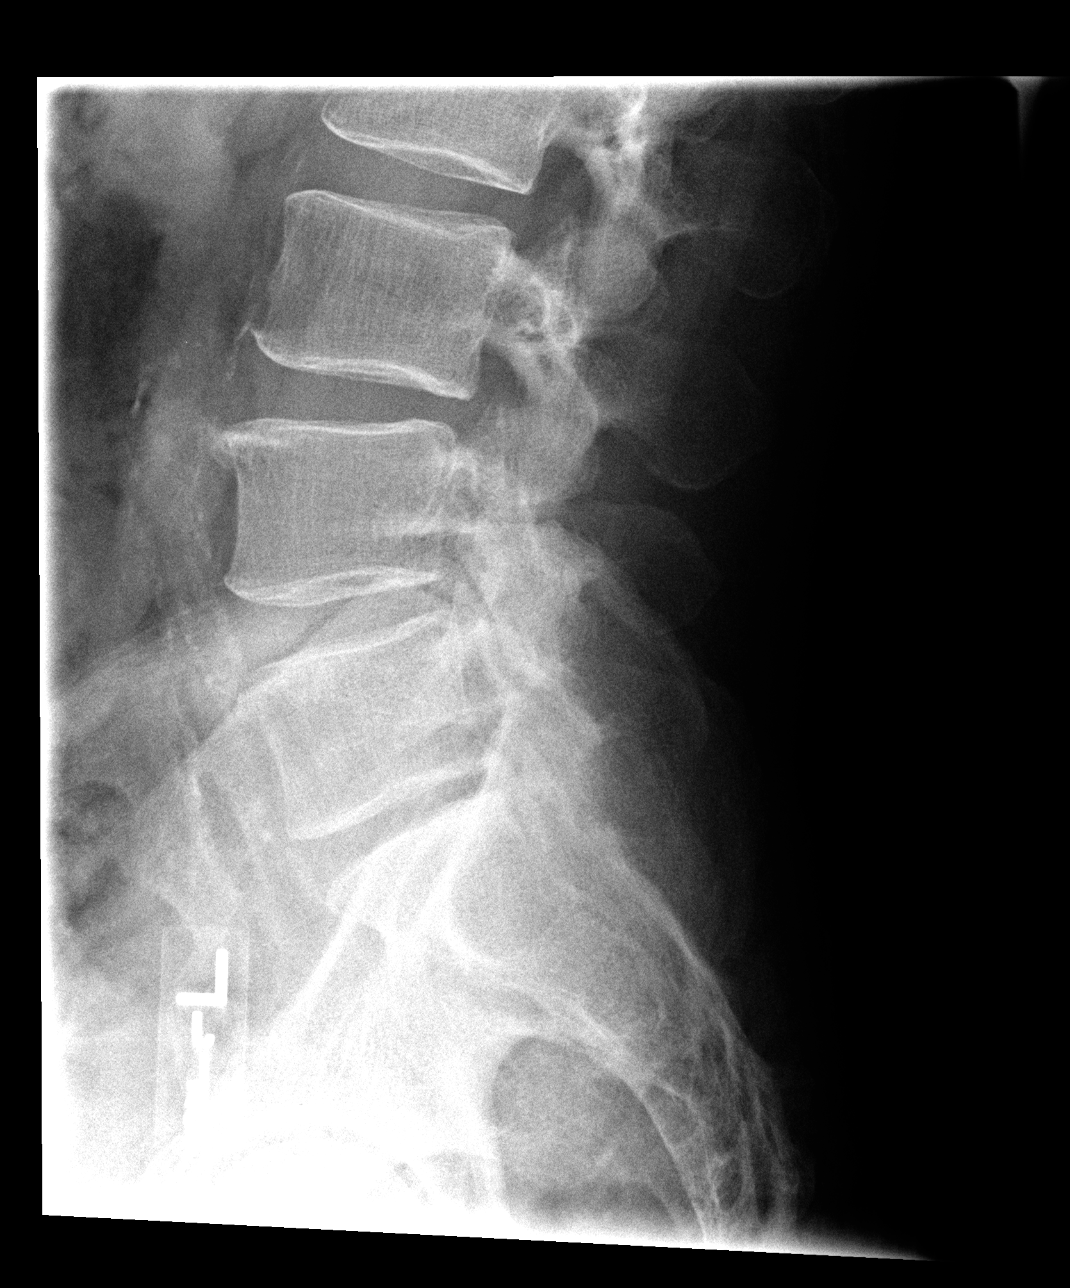

[3 of 3 positions shown; findings below may reference images not displayed]

FINDINGS: Normal alignment and mineralization. Negative for
fracture, dislocation, or other acute bone injury. Vertebral body
and intervertebral disc heights relatively well maintained
throughout.  4 mm calcification projects over the left renal
collecting system.  Patchy atheromatous calcifications in the
abdominal aorta without suggestion of aneurysm.
IMPRESSION: 1.  Unremarkable lumbar spine.
2.  Left nephrolithiasis.
3.  Atheromatous aorta.

## 2010-04-20 LAB — GLUCOSE, CAPILLARY
Glucose-Capillary: 134 mg/dL — ABNORMAL HIGH (ref 70–99)
Glucose-Capillary: 140 mg/dL — ABNORMAL HIGH (ref 70–99)
Glucose-Capillary: 143 mg/dL — ABNORMAL HIGH (ref 70–99)
Glucose-Capillary: 143 mg/dL — ABNORMAL HIGH (ref 70–99)
Glucose-Capillary: 156 mg/dL — ABNORMAL HIGH (ref 70–99)
Glucose-Capillary: 191 mg/dL — ABNORMAL HIGH (ref 70–99)
Glucose-Capillary: 223 mg/dL — ABNORMAL HIGH (ref 70–99)
Glucose-Capillary: 241 mg/dL — ABNORMAL HIGH (ref 70–99)
Glucose-Capillary: 271 mg/dL — ABNORMAL HIGH (ref 70–99)
Glucose-Capillary: 275 mg/dL — ABNORMAL HIGH (ref 70–99)
Glucose-Capillary: 108 mg/dL — ABNORMAL HIGH (ref 70–99)
Glucose-Capillary: 138 mg/dL — ABNORMAL HIGH (ref 70–99)
Glucose-Capillary: 149 mg/dL — ABNORMAL HIGH (ref 70–99)
Glucose-Capillary: 160 mg/dL — ABNORMAL HIGH (ref 70–99)
Glucose-Capillary: 180 mg/dL — ABNORMAL HIGH (ref 70–99)
Glucose-Capillary: 186 mg/dL — ABNORMAL HIGH (ref 70–99)

## 2010-04-20 LAB — BASIC METABOLIC PANEL
BUN: 12 mg/dL (ref 6–23)
BUN: 18 mg/dL (ref 6–23)
CO2: 21 mEq/L (ref 19–32)
CO2: 26 mEq/L (ref 19–32)
CO2: 28 mEq/L (ref 19–32)
Calcium: 9 mg/dL (ref 8.4–10.5)
Calcium: 9.3 mg/dL (ref 8.4–10.5)
Calcium: 9.8 mg/dL (ref 8.4–10.5)
Calcium: 8.5 mg/dL (ref 8.4–10.5)
Chloride: 105 mEq/L (ref 96–112)
Chloride: 111 mEq/L (ref 96–112)
Creatinine, Ser: 0.88 mg/dL (ref 0.4–1.5)
Creatinine, Ser: 1.49 mg/dL (ref 0.4–1.5)
Creatinine, Ser: 1 mg/dL (ref 0.4–1.5)
GFR calc Af Amer: >60 mL/min (ref >60)
GFR calc Af Amer: >60 mL/min (ref >60)
GFR calc Af Amer: >60 mL/min (ref >60)
GFR calc Af Amer: >60 mL/min (ref >60)
GFR calc non Af Amer: >60 mL/min (ref >60)
Glucose, Bld: 145 mg/dL — ABNORMAL HIGH (ref 70–99)
Glucose, Bld: 260 mg/dL — ABNORMAL HIGH (ref 70–99)
Potassium: 4 mEq/L (ref 3.5–5.1)
Potassium: 4.1 mEq/L (ref 3.5–5.1)
Potassium: 4.4 mEq/L (ref 3.5–5.1)
Sodium: 135 mEq/L (ref 135–145)
Sodium: 136 mEq/L (ref 135–145)
Sodium: 137 mEq/L (ref 135–145)
Sodium: 134 mEq/L — ABNORMAL LOW (ref 135–145)

## 2010-04-20 LAB — RAPID URINE DRUG SCREEN, HOSP PERFORMED
Amphetamines: NOT DETECTED
Barbiturates: NOT DETECTED
Opiates: POSITIVE — AB

## 2010-04-20 LAB — COMPREHENSIVE METABOLIC PANEL
ALT: 41 U/L (ref 0–53)
AST: 54 U/L — ABNORMAL HIGH (ref 0–37)
Albumin: 3.8 g/dL (ref 3.5–5.2)
CO2: 19 mEq/L (ref 19–32)
Calcium: 9 mg/dL (ref 8.4–10.5)
Creatinine, Ser: 1.06 mg/dL (ref 0.4–1.5)
GFR calc Af Amer: >60 mL/min (ref >60)
GFR calc non Af Amer: >60 mL/min (ref >60)
Sodium: 134 mEq/L — ABNORMAL LOW (ref 135–145)
Total Protein: 7.6 g/dL (ref 6.0–8.3)

## 2010-04-20 LAB — CBC
HCT: 34.9 % — ABNORMAL LOW (ref 39.0–52.0)
HCT: 37 % — ABNORMAL LOW (ref 39.0–52.0)
HCT: 38.8 % — ABNORMAL LOW (ref 39.0–52.0)
Hemoglobin: 12.1 g/dL — ABNORMAL LOW (ref 13.0–17.0)
Hemoglobin: 12.8 g/dL — ABNORMAL LOW (ref 13.0–17.0)
Hemoglobin: 13.4 g/dL (ref 13.0–17.0)
Hemoglobin: 13.8 g/dL (ref 13.0–17.0)
Hemoglobin: 12.6 g/dL — ABNORMAL LOW (ref 13.0–17.0)
MCHC: 34.1 g/dL (ref 30.0–36.0)
MCHC: 34.3 g/dL (ref 30.0–36.0)
MCHC: 34.5 g/dL (ref 30.0–36.0)
MCHC: 35.1 g/dL (ref 30.0–36.0)
MCV: 89.4 fL (ref 78.0–100.0)
MCV: 89.5 fL (ref 78.0–100.0)
MCV: 90.6 fL (ref 78.0–100.0)
Platelets: 220 10*3/uL (ref 150–400)
Platelets: 223 10*3/uL (ref 150–400)
Platelets: 267 10*3/uL (ref 150–400)
Platelets: 287 K/uL (ref 150–400)
RBC: 3.86 MIL/uL — ABNORMAL LOW (ref 4.22–5.81)
RBC: 4.18 MIL/uL — ABNORMAL LOW (ref 4.22–5.81)
RBC: 4.34 MIL/uL (ref 4.22–5.81)
RBC: 4.4 MIL/uL (ref 4.22–5.81)
RBC: 4.48 MIL/uL (ref 4.22–5.81)
RBC: 4.04 MIL/uL — ABNORMAL LOW (ref 4.22–5.81)
RDW: 13.8 % (ref 11.5–15.5)
RDW: 14.1 % (ref 11.5–15.5)
WBC: 11.6 10*3/uL — ABNORMAL HIGH (ref 4.0–10.5)
WBC: 7.7 10*3/uL (ref 4.0–10.5)
WBC: 7.8 10*3/uL (ref 4.0–10.5)
WBC: 8.3 K/uL (ref 4.0–10.5)
WBC: 8.6 10*3/uL (ref 4.0–10.5)
WBC: 7.6 10*3/uL (ref 4.0–10.5)

## 2010-04-20 LAB — LIPID PANEL
HDL: 34 mg/dL — ABNORMAL LOW (ref >39)
Total CHOL/HDL Ratio: 8.1 RATIO
Triglycerides: 269 mg/dL — ABNORMAL HIGH (ref <150)
VLDL: 54 mg/dL — ABNORMAL HIGH (ref 0–40)

## 2010-04-20 LAB — HEMOGLOBIN A1C: Hgb A1c MFr Bld: 6.5 % — ABNORMAL HIGH (ref 4.6–6.1)

## 2010-04-20 LAB — PROTIME-INR
INR: 1 (ref 0.00–1.49)
Prothrombin Time: 13 seconds (ref 11.6–15.2)

## 2010-04-20 LAB — HEMOCCULT GUIAC POC 1CARD (OFFICE): Fecal Occult Bld: POSITIVE

## 2010-04-20 LAB — HEPATIC FUNCTION PANEL
Albumin: 3.5 g/dL (ref 3.5–5.2)
Alkaline Phosphatase: 63 U/L (ref 39–117)
Total Protein: 6.8 g/dL (ref 6.0–8.3)

## 2010-04-20 LAB — TSH: TSH: 0.67 u[IU]/mL (ref 0.350–4.500)

## 2010-04-20 LAB — LACTIC ACID, PLASMA: Lactic Acid, Venous: 1.4 mmol/L (ref 0.5–2.2)

## 2010-04-21 LAB — COMPREHENSIVE METABOLIC PANEL
ALT: 32 U/L (ref 0–53)
AST: 28 U/L (ref 0–37)
Albumin: 4.1 g/dL (ref 3.5–5.2)
CO2: 28 mEq/L (ref 19–32)
Chloride: 106 mEq/L (ref 96–112)
Creatinine, Ser: 1.1 mg/dL (ref 0.4–1.5)
GFR calc Af Amer: >60 mL/min (ref >60)
GFR calc non Af Amer: >60 mL/min (ref >60)
Potassium: 5.1 mEq/L (ref 3.5–5.1)
Sodium: 140 mEq/L (ref 135–145)
Total Bilirubin: 0.7 mg/dL (ref 0.3–1.2)

## 2010-04-21 LAB — URINALYSIS, ROUTINE W REFLEX MICROSCOPIC
Glucose, UA: NEGATIVE mg/dL
Ketones, ur: NEGATIVE mg/dL
Nitrite: NEGATIVE
Protein, ur: NEGATIVE mg/dL
pH: 5 (ref 5.0–8.0)

## 2010-04-21 LAB — CBC
Platelets: 239 10*3/uL (ref 150–400)
RBC: 4.57 MIL/uL (ref 4.22–5.81)
WBC: 7.8 10*3/uL (ref 4.0–10.5)

## 2010-04-21 LAB — TYPE AND SCREEN

## 2010-04-21 LAB — APTT: aPTT: 35 seconds (ref 24–37)

## 2010-04-22 LAB — GLUCOSE, CAPILLARY: Glucose-Capillary: 176 mg/dL — ABNORMAL HIGH (ref 70–99)

## 2010-04-24 LAB — POCT I-STAT, CHEM 8
BUN: 19 mg/dL (ref 6–23)
Calcium, Ion: 1.16 mmol/L (ref 1.12–1.32)
Creatinine, Ser: 0.8 mg/dL (ref 0.4–1.5)
Glucose, Bld: 144 mg/dL — ABNORMAL HIGH (ref 70–99)
Hemoglobin: 14.3 g/dL (ref 13.0–17.0)
Sodium: 139 mEq/L (ref 135–145)
TCO2: 25 mmol/L (ref 0–100)

## 2010-04-29 LAB — GLUCOSE, CAPILLARY
Glucose-Capillary: 90 mg/dL (ref 70–99)
Glucose-Capillary: 144 mg/dL — ABNORMAL HIGH (ref 70–99)

## 2010-05-06 ENCOUNTER — Other Ambulatory Visit: Payer: Medicaid Other

## 2010-05-07 ENCOUNTER — Encounter: Payer: Self-pay | Admitting: Internal Medicine

## 2010-05-09 ENCOUNTER — Other Ambulatory Visit: Payer: Medicaid Other

## 2010-05-09 DIAGNOSIS — M869 Osteomyelitis, unspecified: Secondary | ICD-10-CM

## 2010-05-09 LAB — CBC WITH DIFFERENTIAL/PLATELET
Eosinophils Absolute: 0.7 10*3/uL (ref 0.0–0.7)
Eosinophils Relative: 6 % — ABNORMAL HIGH (ref 0–5)
Hemoglobin: 15.4 g/dL (ref 13.0–17.0)
Lymphocytes Relative: 32 % (ref 12–46)
Lymphs Abs: 3.6 10*3/uL (ref 0.7–4.0)
MCH: 31.2 pg (ref 26.0–34.0)
MCV: 91.7 fL (ref 78.0–100.0)
Monocytes Relative: 6 % (ref 3–12)
Platelets: 340 10*3/uL (ref 150–400)
RBC: 4.94 MIL/uL (ref 4.22–5.81)
WBC: 11.3 10*3/uL — ABNORMAL HIGH (ref 4.0–10.5)

## 2010-05-09 NOTE — Progress Notes (Signed)
Please fax results to Montez Morita, PA, [facility]. [phone].

## 2010-05-10 LAB — SEDIMENTATION RATE: Sed Rate: 22 mm/hr — ABNORMAL HIGH (ref 0–16)

## 2010-05-12 ENCOUNTER — Telehealth: Payer: Self-pay | Admitting: *Deleted
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
## 2010-05-14 NOTE — Telephone Encounter (Signed)
Spoke with [facility]-Sleep Study done in 2010 will be fine to send with the order for the CPAP machine.  Will need to include the settings as well.

## 2010-05-15 ENCOUNTER — Other Ambulatory Visit: Payer: Self-pay | Admitting: Internal Medicine

## 2010-05-15 DIAGNOSIS — G4733 Obstructive sleep apnea (adult) (pediatric): Secondary | ICD-10-CM

## 2010-05-15 NOTE — Assessment & Plan Note (Signed)
Pt did not have a complete titration study and never followed up.  CPAP titrated to pressure 14 and then changed to bipap 14 and 10.  Will place order for [facility] to arrange.

## 2010-05-15 NOTE — Telephone Encounter (Signed)
CPAP order filled out.  Thanks

## 2010-05-20 ENCOUNTER — Other Ambulatory Visit: Payer: Self-pay | Admitting: *Deleted

## 2010-05-20 NOTE — Telephone Encounter (Signed)
Pt calls and states [facility] has called him and told him he must have a new sleep study in order to get a new cpap, please advise... appt in clinic? Or have your nurse do a referral?

## 2010-05-20 NOTE — Telephone Encounter (Signed)
Pt calls and wants 90 day supplies of meds due to cost

## 2010-05-21 MED ORDER — LISINOPRIL 5 MG PO TABS: 5.0000 mg | ORAL_TABLET | Freq: Every day | ORAL | Status: DC

## 2010-05-21 MED ORDER — ATENOLOL 50 MG PO TABS: 50.0000 mg | ORAL_TABLET | Freq: Every day | ORAL | Status: DC

## 2010-05-21 MED ORDER — FENOFIBRATE 48 MG PO TABS: 48.0000 mg | ORAL_TABLET | Freq: Every day | ORAL | Status: DC

## 2010-05-21 NOTE — Telephone Encounter (Signed)
For sleep study Dr Onalee Hua wrote : Needs trial of auto-titrated CPAP at home based on this experience. As an alternative, he could return to the [facility], perhaps bringing a sleep medication, for a formal CPAP titration study. As another alternative, consider calling the [facility] staff during the daytime to request CPAP desensitization and reassessment.  Will put in order so pt can get referral and Dr Onalee Hua can do particulars when her returns

## 2010-05-27 ENCOUNTER — Ambulatory Visit: Payer: Medicaid Other | Admitting: Physical Medicine & Rehabilitation

## 2010-05-27 ENCOUNTER — Encounter: Payer: Medicaid Other | Attending: Neurosurgery | Admitting: Neurosurgery

## 2010-05-27 DIAGNOSIS — G8929 Other chronic pain: Secondary | ICD-10-CM | POA: Insufficient documentation

## 2010-05-27 DIAGNOSIS — J4489 Other specified chronic obstructive pulmonary disease: Secondary | ICD-10-CM | POA: Insufficient documentation

## 2010-05-27 DIAGNOSIS — M25519 Pain in unspecified shoulder: Secondary | ICD-10-CM | POA: Insufficient documentation

## 2010-05-27 DIAGNOSIS — M542 Cervicalgia: Secondary | ICD-10-CM

## 2010-05-27 DIAGNOSIS — M19079 Primary osteoarthritis, unspecified ankle and foot: Secondary | ICD-10-CM

## 2010-05-27 DIAGNOSIS — I10 Essential (primary) hypertension: Secondary | ICD-10-CM | POA: Insufficient documentation

## 2010-05-27 DIAGNOSIS — J449 Chronic obstructive pulmonary disease, unspecified: Secondary | ICD-10-CM | POA: Insufficient documentation

## 2010-05-27 DIAGNOSIS — E119 Type 2 diabetes mellitus without complications: Secondary | ICD-10-CM | POA: Insufficient documentation

## 2010-05-27 DIAGNOSIS — G8928 Other chronic postprocedural pain: Secondary | ICD-10-CM | POA: Insufficient documentation

## 2010-05-27 DIAGNOSIS — M25579 Pain in unspecified ankle and joints of unspecified foot: Secondary | ICD-10-CM

## 2010-05-27 DIAGNOSIS — M545 Low back pain: Secondary | ICD-10-CM

## 2010-05-27 DIAGNOSIS — R209 Unspecified disturbances of skin sensation: Secondary | ICD-10-CM | POA: Insufficient documentation

## 2010-05-27 NOTE — Assessment & Plan Note (Signed)
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  Surgery was on 07/30/2008.  He did well initially and was discharged without difficulty.  He was  admitted to the hospital 1 month later in late August with initially a  GI bleed and then had a stroke while in the hospital.  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  The patient was  quite upset about all of this and had some confusion regarding treatment  recommendations.  From a bypass standpoint he does have palpable graft  pulse.  His wounds are all well-healed and he has an ankle arm index in  the 0.8 range with duplex showing no evidence of stenosis at his bypass.   He will continue to be followed in our office with protocol.  He did  have recommendation for repeat cerebral arteriogram to rule out any  progression of stenosis in 9 months and this will be arranged with Dr.  Corliss Skains, interventional radiology.  He unfortunately has started  smoking again and I explained the critical importance of smoking  cessation to him to reduce his risk for progressive cerebrovascular  events.   Larina Earthly, M.D.  Electronically Signed   TFE/MEDQ  D:  10/12/2008  T:  10/12/2008  Job:  [id number]   cc:   Sanjeev K. Corliss Skains, M.D.  Mariea Stable, MD

## 2010-05-27 NOTE — Discharge Summary (Signed)
[name], BECKNER NO.:  [id number]   MEDICAL RECORD NO.:  [id number]          PATIENT TYPE:  OBV   LOCATION:  2002                         FACILITY:  [facility]   PHYSICIAN:  Hillery Aldo, M.D.   DATE OF BIRTH:  [birth year]   DATE OF ADMISSION:  09/03/2007  DATE OF DISCHARGE:  09/06/2007                               DISCHARGE SUMMARY   PRIMARY CARE PHYSICIAN:  Dr. Earlene Plater at the [facility].   DISCHARGE DIAGNOSES:  1. Atypical chest pain, acute coronary syndrome ruled out.  2. Transient paresthesias of uncertain etiology, possibly due to      transient ischemic attack.  3. Peripheral vascular disease.  4. Hypertension.  5. Type 2 diabetes.  6. Tobacco abuse.  7. Dyslipidemia.  8. Thrombosis on chronic Coumadin.   DISCHARGE MEDICATIONS:  1. Coumadin 5 mg every Tuesday, Thursday, and Saturday; 7.5 mg every      Monday, Wednesday, Friday, and Sunday.  2. Fish oil 1000 mg 2 tablets b.i.d.  3. Metformin 500 mg b.i.d.  4. Vytorin 10/20 daily.  5. Atenolol 50 mg daily.  6. Enalapril 10 mg daily.  7. Aspirin 81 mg daily.  8. Tegretol 200 mg b.i.d.  9. Voltaren 75 mg b.i.d.  10.Ranitidine 150 mg b.i.d.  11.Albuterol HFA 2 puffs q.i.d. p.r.n.   CONSULTATIONS:  [name], MD of Cardiology.   BRIEF ADMISSION HISTORY OF PRESENT ILLNESS:  The patient is a 50-year-  old male who presented with atypical chest pain and neurologic symptoms  of numbness involving the left facial area.  He had multiple cardiac  risk factors and therefore was admitted for further evaluation and  workup.  For the full details, please see the dictated report done by  [name].   PROCEDURES AND DIAGNOSTIC STUDIES:  1. Chest x-ray on September 04, 2007, showed no acute cardiopulmonary      process with central bronchitic changes.  2. CT scan of the head on September 04, 2007, showed no acute      intracranial process.  3. Nuclear medicine adenosine  Cardiolite on September 06, 2007, showed      normal myocardial perfusion study with no infarction or ischemia.      Ejection fraction 51%.  Mild diaphragmatic attenuation of the      inferior wall.   DISCHARGE LABORATORY VALUES:  Cardiac markers were cycled and were  negative.  Sodium is 137, potassium 5.3, chloride 103, bicarb 24, BUN  14, creatinine 0.96, and glucose 101.  PT 27.8 and INR 2.4.  White blood  cell count was 6.4, hemoglobin 13.7, hematocrit 39.5, and platelets 208.   HOSPITAL COURSE:  1. Atypical chest pain:  The patient was admitted and cardiac markers      were cycled q.8 h. x3 sets and were negative.  Because of a high      pretest probability, the patient was seen in consultation with Dr.      [name] of Cardiology.  An adenosine Cardiolite was performed and      was found to be negative.  At this time, the  patient is deemed      stable for discharge and his chest pain is attributed to noncardiac      causes.  2. Peripheral vascular disease:  The patient was maintained on      Coumadin therapy due to his history of thrombosis.  His INR was      checked and was found to be therapeutic.  3. Hypertension:  The patient's blood pressure is well controlled on      his outpatient regimen.  4. Diabetes:  The patient's diabetes is well controlled on metformin.  5. Tobacco abuse:  The patient was counseled on cessation.  He states,      he actually quit approximately 1 month ago, but due to the acute      stress of his illness, he began to smoke again.  He is provided      with a nicotine patch while in the hospital and has agreed that he      will quit smoking at discharge.  6. Dyslipidemia:  The patient has marked dyslipidemia.  A fasting      lipid panel was checked.  He was found to have a cholesterol of      402, triglycerides of 747, HDL of 23 with LDL unable to be      calculated.  His Vytorin was increased to 10/20 daily and it has      been suggested by Dr. [name]  that the patient may benefit from a      change in his current statin to Crestor or Lipitor with Zetia.  We      will leave this up to the discretion of his primary care physician      as the patient reports that he has had some problems with      tolerance.  7. Transient numbness in the facial area:  The patient did have a CT      scan that was negative.  He is maintained on Coumadin.  A further      evaluation can be done as an outpatient if he develops recurrent      problems with numbness; however, he is being maximally managed at      this point medically.   DISPOSITION:  The patient is medically stable and [facility].      Hillery Aldo, M.D.  Electronically Signed     CR/MEDQ  D:  09/06/2007  T:  09/07/2007  Job:  [id number]   cc:   Dr. Earlene Plater

## 2010-05-27 NOTE — H&P (Signed)
[name], [name]               ACCOUNT NO.:  [id number]   MEDICAL RECORD NO.:  [id number]          PATIENT TYPE:  INP   LOCATION:  2312                         FACILITY:  [facility]   PHYSICIAN:  Larina Earthly, M.D.    DATE OF BIRTH:  Mar 10, 1960   DATE OF ADMISSION:  06/02/2006  DATE OF DISCHARGE:                              HISTORY & PHYSICAL   ADMISSION DIAGNOSIS:  Right foot ischemia.   HISTORY OF PRESENT ILLNESS:  The patient is a 50 year old white male,  who is approximately 10 to 12 years status post right femoral to  popliteal bypass reclusive disease by Dr. Gretta Began.  He has been in  the [employer] system for several years with  approximately 18 months remaining on his time to be served.  He reports  that he has had some difficulty with walking with apparent claudication  over the past several weeks to months, but as of yesterday had marked  worsening and had rest pain throughout the night last night.  He  presented to the emergency room today, and had a cool foot.  He did have  motor and sensory function.   PAST MEDICAL HISTORY:  Significant for:  1. Diabetes.  2. Gastroesophageal reflux.  3. Hypertension.  4. Impotence.   SOCIAL HISTORY:  Significant for positive for smoking.  Does not have  any drug abuse.  Does not drink alcohol.   ALLERGIES:  BENADRYL.   CURRENT MEDICATIONS:  1. Aspirin 81 mg a day.  2. Tegretol 200 mg p.o. daily.  3. Atenolol 50 mg p.o. daily.  4. Zantac 150 mg p.o. b.i.d.  5. Diclofenac.  6. Potassium orally twice a day, 75 mg.   PHYSICAL EXAMINATION:  VITAL SIGNS:  Pulse is 82, respirations 21, blood  pressure is 150/87, temperature is 97.3.  GENERAL:  He is a well-developed, well-nourished, white male in no acute  distress aside from his right foot discomfort.  CHEST:  Clear bilaterally.  HEART:  Regular rate and rhythm.  ABDOMEN:  Benign.  EXTREMITIES:  He has 2+ radial and 2+ femoral pulses.  He has 2+ left  posterior tibial pulse, and absent right pedal pulses.  He has no  Doppler signal in his right foot.   IMPRESSION:  Right foot ischemia with 50 year old femoral-popliteal  bypass.   PLAN:  The patient will be taken to interventional radiology for  arteriography and possible thrombolysis versus surgical revision.  He  currently has motor and sensory intact, and there is no need for  emergent operative treatment.      Larina Earthly, M.D.  Electronically Signed     TFE/MEDQ  D:  06/02/2006  T:  06/02/2006  Job:  [id number]

## 2010-05-27 NOTE — Assessment & Plan Note (Signed)
OFFICE VISIT   RORY, XIANG  DOB:  10-Nov-1960                                       12/24/2006  ZOXWR#:[id number]   The patient presents to our office for followup of his right femoral  popliteal bypass.  This was initially placed by me in June 1999 for  severe ischemia.  He suffered occlusion of this bypass in June 2008 and  had successful treatment with thrombolysis.  He continues to have no  evidence of ischemia.  He is walking without difficulty.  No  claudication symptoms.   PHYSICAL EXAM:  He does have a very prominent pulse in his posterior  popliteal area.  His ankle brachial index remains normal at 1.0 on the  right.  He does have a significant drop in the left at 0.73 but no  significant symptoms related to this.  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  I discussed this at length with the patient.  I  explained that I do feel this puts him at some increased risk of  occlusion of his bypass and would recommend elective treatment of this.  I explained that this would require harvest of his saphenous vein from  his ankle, and interposition graft at the prior area of the vein.  I  explained that this is not emergent or urgent, but would recommend this  electively at his convenience.  He is currently in a work release  program and is quite adamant in not interrupting this.  I explained that  he would have approximately 4 day hospitalization followed by limited  activity for 3 to 4 weeks.  He will discuss this with the  administration at the work facility to determine if this is possible  around his work schedule.  He will notify us when he makes a decision.   Larina Earthly, M.D.  Electronically Signed   TFE/MEDQ  D:  12/24/2006  T:  12/27/2006  Job:  [id number]   cc:   Attn:  Nurse Irving Burton [facility]
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
## 2010-05-27 NOTE — Discharge Summary (Signed)
[name], [name]               ACCOUNT NO.:  [id number]   MEDICAL RECORD NO.:  [id number]          PATIENT TYPE:  INP   LOCATION:  2037                         FACILITY:  [facility]   PHYSICIAN:  Larina Earthly, M.D.    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   HISTORY OF PRESENT ILLNESS:  The patient was seen by Dr. Arbie [name] and was  found to have aneurysm embolization in the greater saphenous vein graft  just above the left popliteal anastomosis.  This has been followed with  ultrasound and has increased in size to 2.9 cm from 2.2 cm.  It was felt  that the patient should undergo a repair of aneurysm with new vein graft  bypass.   PAST MEDICAL HISTORY:  Significant for type 2 diabetes, hypertension,  hyperlipidemia, and history of angina.   HOSPITAL COURSE:  The patient was taken to the operating room on July 30, 2008, for a right fem-pop bypass revision with interposition greater  saphenous vein graft from above-knee popliteal to below-knee popliteal  artery with excision of vein graft aneurysm.  Postoperatively, the  patient did well.  He was up and about, he remained afebrile.  His vital  signs remained stable.  Hemoglobin and hematocrit were within normal  limits.  His wounds were healing well.  He had good Doppler signal in  the anterior tibial artery.  His foot remained warm and pink.  His pain  was well controlled with p.o. pain medication, and he is being  discharged on August 01, 2008.   DISPOSITION:  The patient will be discharged on August 01, 2008.  He will  follow up with Dr. Arbie [name] in 2 weeks.  Instructions were given both  verbally and written regarding wound care.   DISCHARGE MEDICATIONS:  1. Metformin 500 mg b.i.d.  2. Atenolol 50  mg daily.  3. Vasotec 10 mg daily.  4. Zantac 150 mg b.i.d.  5. Aspirin 81 mg daily.  6. Fish oil 4000 mg daily.  7. Tramadol 50 mg as needed.  8. Protonix 40 mg daily.  9. Albuterol inhaler p.r.n.      [name] Goo, PA-C      Larina Earthly, M.D.  Electronically Signed    RR/MEDQ  D:  08/01/2008  T:  08/01/2008  Job:  [id number]

## 2010-05-27 NOTE — Procedures (Signed)
BYPASS GRAFT EVALUATION   INDICATION:  Followup right fem-pop bypass graft and right popliteal  aneurysm.   HISTORY:  Diabetes:  Yes.  Cardiac:  Angina.  Hypertension:  Yes.  Smoking:  Yes.  Previous Surgery:  Right fem-pop bypass graft on 07/03/1997 with  occlusion and subsequent thrombolysis on 06/06/2006.   SINGLE LEVEL ARTERIAL EXAM                               RIGHT              LEFT  Brachial:                    131                126  Anterior tibial:             125                105  Posterior tibial:            107                114  Peroneal:  Ankle/brachial index:        0.95               0.87   PREVIOUS ABI:  Date:  12/24/2006  RIGHT:  1.0  LEFT:  0.73   LOWER EXTREMITY BYPASS GRAFT DUPLEX EXAM:   DUPLEX:  Biphasic to monophasic Doppler waveforms noted throughout the  right lower extremity bypass graft and its native vessels with no  increase in velocities.  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   IMPRESSION:  1. Patent right fem-pop bypass graft with no evidence of stenosis      noted.  2. Increased maximum diameter of the right popliteal artery noted when      compared to the previous exam on 12/24/2006.  3. Stable bilateral ankle brachial indices noted when compared to the      previous exams.       ___________________________________________  Larina Earthly, M.D.   CH/MEDQ  D:  03/16/2008  T:  03/16/2008  Job:  [id number]
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
## 2010-05-27 NOTE — Procedures (Signed)
BYPASS GRAFT EVALUATION   INDICATION:  Followup right graft.   HISTORY:  Diabetes:  Yes.  Cardiac:  No.  Hypertension:  Yes.  Smoking:  Previous.  Previous Surgery:  Right fem-pop graft 1999, right popliteal  interpositional graft 07/2008.   SINGLE LEVEL ARTERIAL EXAM                               RIGHT              LEFT  Brachial:                    123                111  Anterior tibial:             99                 65  Posterior tibial:            91                 84  Peroneal:  Ankle/brachial index:        0.80               0.68   PREVIOUS ABI:  Date:  07/23/2009  RIGHT:  0.89  LEFT:  NA   LOWER EXTREMITY BYPASS GRAFT DUPLEX EXAM:   DUPLEX:  Graft changes observed proximally without significant stenosis  throughout the graft.   IMPRESSION:  1. Right ankle brachial index has decreased from previous exam while      the left appears to be stable compared to exam 1 year ago.  2. Patent right femoral-popliteal graft without stenosis, however,      graft changes are seen in the proximal segment.    ___________________________________________  Larina Earthly, M.D.   LT/MEDQ  D:  01/10/2010  T:  01/10/2010  Job:  [id number]
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
## 2010-05-27 NOTE — Procedures (Signed)
BYPASS GRAFT EVALUATION   INDICATION:  Followup Right Fem-pop bypass graft.   HISTORY:  Diabetes:  Yes.  Cardiac:  Angina.  Hypertension:  Yes.  Smoking:  Yes.  Previous Surgery:  Right femoral popliteal on 07/03/1988 with occlusion  and subsequent thrombolysis on 06/06/2006.   SINGLE LEVEL ARTERIAL EXAM                               RIGHT              LEFT  Brachial:                    144                140  Anterior tibial:             110                99  Posterior tibial:            108                123  Peroneal:  Ankle/brachial index:        0.76               0.85   PREVIOUS ABI:  Date:  03/16/2008  RIGHT:  0.95  LEFT:  0.87   LOWER EXTREMITY BYPASS GRAFT DUPLEX EXAM:   DUPLEX:  Biphasic to monophasic Doppler waveforms noted throughout the  right lower extremity bypass graft with no increase in velocities.   IMPRESSION:  1. Right ankle brachial index suggests mild to moderate disease.  2. Left ankle brachial index suggests mild disease.  3. Patent femoral-popliteal bypass graft with no evidence of stenosis.        ___________________________________________  Larina Earthly, M.D.   CJ/MEDQ  D:  10/12/2008  T:  10/12/2008  Job:  [id number]
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
## 2010-05-27 NOTE — Assessment & Plan Note (Signed)
OFFICE VISIT   [name], [name]  DOB:  1960-04-23                                       04/20/2008  ZHYQM#:[id number]   The patient presents today for continued followup of his lower extremity  arterial insufficiency and prior right fem-pop bypass.  He had undergone  prior evaluation for an aneurysm in the vein above the left popliteal  anastomosis.  This has been followed with ultrasound.  His complaints  currently are of total leg pain in right and left leg that begins up in  his low back and extends down to his hips and buttocks and the lateral  aspect of his legs bilaterally.  He also reports erectile dysfunction.  I reviewed his prior arteriograms and explained that this does not show  any evidence of inflow iliac disease that would explain this.   On physical exam he continues to have 2+ femoral pulses bilaterally.  He  does have a prominent popliteal pulse on the right leg.   He underwent a recent repeat ultrasound of his graft showing persistent  good flow through his bypass with no evidence of stenotic areas.  He  does have enlargement to 2.9 cm in the vein above his popliteal  anastomosis.  This compares with 2.2 cm in December of 2008.  I  discussed this at length with the patient.  I am concerned regarding the  dilatation of this vein and I have recommended that he undergone an  elective revision of this.  I explained that unfortunately this would  not have any impact on his leg pain since he does not appear to have any  evidence of arterial sufficiency.  I have suggested that he see [facility] for neurologic evaluation since he clearly has  another etiology for his limiting symptoms.  We would recommend elective  repair following this evaluation.   Larina Earthly, M.D.  Electronically Signed   TFE/MEDQ  D:  04/25/2008  T:  04/26/2008  Job:  [id number]   cc:   Erroll Luna.
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
## 2010-05-27 NOTE — Assessment & Plan Note (Signed)
OFFICE VISIT   [name], [name] R  DOB:  1961-01-03                                       06/21/2006  ZOXWR#:[id number]   [name] is here for followup of his recent hospitalization for  occlusion of his fem-pop bypass.  He is known to me from a prior right  femoral to below the knee popliteal bypass with nonreversed saphenous  vein in June 1999 for critical ischemia.  He presented on May 21 with  occluded right fem/pop bypass.  He is currently in the [employer].  He had occluded his graft and underwent  successful thrombolysis.  This revealed no evidence of residual  stenosis.  He has done well since his discharge back to the prison  facility, is walking without difficulty.  There has been some confusion  regarding his activity level following discharge.  He has no  restrictions regarding activities, specifically no walking or lifting  restrictions.   On physical exam he has a 2+ right dorsalis pedis and 2+ left posterior  tibial pulse.  Feet are well perfused bilaterally.   He underwent a noninvasive vascular study today revealing normal flow  bilaterally.  I am pleased with this initial result and recommend that  he continue on Coumadin therapy.  I explained that he could have early  recurrence of this and will notify us if this should occur; otherwise we  will see him in 6 months for repeat _________.   Larina Earthly, M.D.  Electronically Signed   TFE/MEDQ  D:  06/21/2006  T:  2020/12/2406  Job:  [id number]

## 2010-05-27 NOTE — Procedures (Signed)
BYPASS GRAFT EVALUATION   INDICATION:  Followup evaluation of lower extremity bypass graft.   HISTORY:  Diabetes:  Yes  Cardiac:  Angina  Hypertension:  Yes  Smoking:  Yes  Previous Surgery:  Right fem-pop bypass graft with translocated  nonreverse saphenous vein on 07/03/1997 with occlusion and subsequent  thrombolysis on 06/06/2006.   SINGLE LEVEL ARTERIAL EXAM                               RIGHT              LEFT  Brachial:                    123                132  Anterior tibial:             132 (1.0)          97  Posterior tibial:            91                 97 (0.73)  Peroneal:                    131  Ankle/brachial index:   PREVIOUS ABI:  Date: 06/23/2006  RIGHT:  >1.0  LEFT:  0.94   LOWER EXTREMITY BYPASS GRAFT DUPLEX EXAM:   DUPLEX:  Waveforms are triphasic proximal to within and biphasic distal  to the right fem-pop bypass graft.  The right native popliteal artery at  the distal anastomosis is dilated to 2.2 cm AP x 2.1 cm transverse.   IMPRESSION:  1. The right ABI is stable from previous studies.  2. The left ABI has decreased since the previous study.  3. 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   ___________________________________________  Larina Earthly, M.D.   MC/MEDQ  D:  12/24/2006  T:  12/26/2006  Job:  [id number]

## 2010-05-27 NOTE — Procedures (Signed)
BYPASS GRAFT EVALUATION   INDICATION:  Right lower extremity pain, redness, swelling.   HISTORY:  Diabetes:  Yes.  Cardiac:  Angina.  Hypertension:  Yes.  Smoking:  Yes.  Previous Surgery:  Right fem-pop June of 1990 with occlusion and  thrombolysis May of 2008.   SINGLE LEVEL ARTERIAL EXAM                               RIGHT              LEFT  Brachial:                    105                109  Anterior tibial:             75                 72  Posterior tibial:            Absent             72  Peroneal:  Ankle/brachial index:        0.69               0.66   PREVIOUS ABI:  Date:  10/12/2008  RIGHT:  0.76  LEFT:  0.85   LOWER EXTREMITY BYPASS GRAFT DUPLEX EXAM:   DUPLEX:  Biphasic Doppler waveforms throughout bypass graft and  connecting arteries.  Posterior tibial artery appears occluded.   IMPRESSION:  1. Patent bypass graft with no evidence of stenosis.  2. Right posterior tibial artery is occluded in the mid calf to the      ankle.  3. Left ankle brachial index shows moderate disease.         ___________________________________________  Larina Earthly, M.D.   CJ/MEDQ  D:  12/28/2008  T:  12/29/2008  Job:  [id number]
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
## 2010-05-27 NOTE — Assessment & Plan Note (Signed)
OFFICE VISIT   [name], [name]  DOB:  [birth year]                                       03/16/2008  UEAVW#:[id number]   [name] presents today for continued followup of his diffuse  peripheral vascular occlusive disease.  He is well-known to me from a  right fem-pop bypass in June 1999 for critical limb ischemia on the  right leg.  He at that time underwent right femoral to below-knee  popliteal bypass with translocated nonreversed saphenous vein.  He did  well until 2008 were he suffered an occlusion of this and had successful  thrombolysis and PTA of the distal anastomosis.  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  The most recent study showed just over 2-cm dilatation of the vein at  this area.  We had discussed elective treatment of this.  He was  incarcerated at the time and deferred this.  He is now released and is  here for further followup.  He reports that he is having multiple other  complaints.  He reports that both hips become fatigued with very little  exercise, and he also reports impotence.  He reports that he initially  had successful treatment with Viagra and now this alone does not work.  He reports that his medical doctor has done a workup for this and can  find no organic cause.   PHYSICAL EXAMINATION:  He has 2+ right femoral pulse, 1+ left femoral  pulse.  He has a very prominent pulsation in his right popliteal space.  I do not feel a left popliteal pulse.  He does have palpable dorsalis  pedis pulse on the right.  I do not feel a pedal pulse on the left.   I reviewed his arteriogram again from May 2008.  This does not show any  evidence of aortoiliac occlusive disease or internal iliac issues.  I  have recommended a repeat arteriogram.  I explained that it is possible  that he has progressed with the iliac  disease which could cause both his  hip claudication and his issues regarding impotence.  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  This on ultrasound today has shown increase in  size up to 2.9 cm.  Ankle-arm index is 0.95 on the right and 0.87 on the  left.  He understands this, and we will proceed with arteriography by  Dr. Durene Cal on 03/22/2008 at [facility].   Larina Earthly, M.D.  Electronically Signed   TFE/MEDQ  D:  03/16/2008  T:  03/20/2008  Job:  [id number]   cc:   [facility]  Mariea Stable, MD

## 2010-05-27 NOTE — Procedures (Signed)
NAME:  YOUSUF, [name]               ACCOUNT NO.:  [id number]   MEDICAL RECORD NO.:  [id number]          PATIENT TYPE:  OUT   LOCATION:  SLEEP CENTER                 FACILITY[facility]  [facility]   PHYSICIAN:  Clinton D. Maple Hudson, MD, FCCP, FACPDATE OF BIRTH:  08-03-1958   DATE OF STUDY:  03/18/2008                            NOCTURNAL POLYSOMNOGRAM   REFERRING PHYSICIAN:  Mariea Stable, MD   INDICATION FOR STUDY:  Hypersomnia with sleep apnea.   EPWORTH SLEEPINESS SCORE:  Epworth sleepiness score 20/24.  BMI 26.9.  Weight 204 pounds, height 73 inches.  Neck 15.5 inches.   MEDICATIONS:  Home medications are charted and reviewed.   SLEEP ARCHITECTURE:  Split study protocol.  During the diagnostic phase,  total sleep time was 143 minutes with sleep efficiency 84.2%.  Stage I  was 1.7%, stage II was 87.1%, stage III was absent, REM was 11.1% of  total sleep time.  Sleep latency 2 minutes.  REM latency 67 minutes.  Awake after sleep onset 6.5 minutes.  Arousal index 18.4.  He took no  medication at bedtime, but left a nicotine patch on his shoulder.   RESPIRATORY DATA:  Apnea/hypopnea index (AHI) 25.5 per hour.  A total of  61 events was counted, all hypopneas.  Nonpositional.  REM AHI 3.8.  CPAP titration was then attempted and he was titrated to 14 CWP, at  which time, he began coughing, woke and asked for water.  He then  expressed discomfort, feeling smothered from CPAP.  The technician  changed him to bilevel PAP at a setting of 14 inspiratory, 10  expiratory.  He said that was more comfortable, but he never regained  his sleep and he asked to stop titration study in favor of returning to  a diagnostic NPSG protocol without CPAP as of 0203 hours.   OXYGEN DATA:  Moderate snoring with oxygen desaturation to a nadir of  89% on room air.  Mean oxygen saturation was 95.1% on room air.   CARDIAC DATA:  Normal sinus rhythm.   MOVEMENT-PARASOMNIA:  Limb jerks were noted with a total of 102  counted,  all identified as periodic limb movement without arousal.   IMPRESSIONS-RECOMMENDATIONS:  1. Moderate obstructive sleep apnea/hypopnea syndrome, AHI 25.5 per      hour, all hypopneas, nonpositional.  Moderately loud snoring with      oxygen desaturation to a nadir of 89%.  2. Unsuccessful attempted CPAP titration because the patient      complained of feeling smothered.  CPAP was titrated to a maximum of      14 CWP and then changed to bilevel PAP at inspiratory pressure 14,      expiratory pressure 10.  He was unable to regain sleep and was      converted back to a diagnostic NPSG protocol without PAP therapy      for the rest of the night.  Suggest consideration of a trial of      auto-titrated CPAP at home based on this experience.  As an      alternative, he could return to the Sleep Center, perhaps bringing      a sleep  medication, for a formal CPAP titration study.  As another      alternative, consider calling the Sleep Center staff during the      daytime to request CPAP desensitization and reassessment.      Clinton D. Maple Hudson, MD, [facility] FACP  Diplomate, Biomedical engineer of Sleep Medicine  Electronically Signed     CDY/MEDQ  D:  03/23/2008 20:23:50  T:  03/24/2008 05:33:39  Job:  [id number]

## 2010-05-27 NOTE — Procedures (Signed)
DUPLEX DEEP VENOUS EXAM - LOWER EXTREMITY   INDICATION:  Right lower extremity pain, redness, and swelling.   HISTORY:  Edema:  Yes.  Trauma/Surgery:  Right fem-pop bypass graft with aneurysm of popliteal  done in June, 1990 and repaired in May of 2008.  Pain:  Yes.  PE:  No.  Previous DVT:  No.  Anticoagulants:  Other:   DUPLEX EXAM:                CFV   SFV   PopV  PTV    GSV                R  L  R  L  R  L  R   L  R  L  Thrombosis    o  o  o     o     O  Spontaneous   +  +  +     +     +  Phasic        +  +  +     +     +  Augmentation  +  +  +     +     +  Compressible  +  +  +     +     +  Competent     +  +  +     +     +   Legend:  + - yes  o - no  p - partial  D - decreased   IMPRESSION:  No evidence of deep venous thrombosis identified.  Great  saphenous vein was stripped previously for the bypass graft.    _____________________________  Larina Earthly, M.D.   CJ/MEDQ  D:  12/28/2008  T:  12/28/2008  Job:  [id number]

## 2010-05-27 NOTE — Op Note (Signed)
NAME:  [name], [name]               ACCOUNT NO.:  [id number]   MEDICAL RECORD NO.:  [id number]          PATIENT TYPE:  AMB   LOCATION:  SDS                          FACILITY:  [facility]   PHYSICIAN:  VDurene Cal IV, MDDATE OF BIRTH:  05-11-1960   DATE OF PROCEDURE:  03/22/2008  DATE OF DISCHARGE:                               OPERATIVE REPORT   PREOPERATIVE DIAGNOSES:  Bilateral claudication, impotence.   POSTOPERATIVE DIAGNOSES:  Bilateral claudication, impotence.   PROCEDURE PERFORMED:  1. Ultrasound access left femoral artery.  2. Abdominal aortogram.  3. Bilateral lower extremity runoff.  4. Second-order catheterization.   INDICATIONS:  A 50 year old gentleman with history of right femoral to  below-knee popliteal bypass graft with a vein.  This was occluded once,  had successful thrombolysis with balloon angioplasty of his distal  anastomosis.  He has a known aneurysmal degeneration at the distal  anastomosis.  He comes in today for diagnostic study with possible  intervention.   PROCEDURE:  The patient identified in the holding area and taken to room  8.  Bilateral groins were prepped and draped in standard sterile fashion  and a time-out was called.  The left femoral artery was evaluated with  ultrasound and found to be rather small in caliber and calcified.  Lidocaine 1% was used for local anesthesia.  The left femoral artery was  accessed under ultrasound guidance with an 18-gauge needle.  An 0.035  Bentson wire was advanced in retrograde fashion into the abdominal aorta  on fluoroscopic visualization.  A 5-French sheath was placed.  Over the  wire, an Omni flush catheter was advanced to the level of L1 and  abdominal aortogram was obtained.  The catheter was pulled down the  bifurcation.  Pelvic angiogram was obtained followed by bilateral lower  extremity runoff.  To better evaluate the right lower extremity using  the Omni flush catheter and Bentson wire, the aortic  bifurcation was  crossed.  Catheter was placed in right external iliac artery and right  leg runoff was further obtained.   FINDINGS:  Aortogram:  The visualized portions of the suprarenal  abdominal aorta showed minimal disease.  There were single renal  arteries bilaterally which were widely patent.  The infrarenal abdominal  aorta shows minimal disease.   Pelvic angiogram:  The right common, external, internal iliac arteries  are all widely patent with minimal disease.  There is mild disease  within the left common iliac artery, the left external iliac and  internal iliac artery are all widely patent.   Left lower extremity:  The left common femoral artery is patent with  minimal disease.  The left profunda femoral artery is widely patent.  The left superficial femoral artery is patent throughout its course with  mild disease.  The left popliteal artery is widely patent.  The patient  has three-vessel runoff.   Right lower extremity:  The right common femoral artery is patent  without significant disease.  There is a high-grade stenosis at the  origin of the right profunda femoral artery.  Bypass graft is visualized  originating  from the distal common femoral artery.  The bypass graft is  patent throughout its course.  There is a stenosis in the distal vein at  the level of the patella.  There is aneurysmal changes within the vein  behind the knee.  The dominant runoff is the anterior tibial artery  which is diseased.  Peroneal artery is diseased at the  ankle.  There is  significant disease within the pedal vessels.   At this point in time, the decision was made not to intervene.  Catheters and wires were removed.  The patient was taken to holding area  for sheath pull.   IMPRESSION:  1. No evidence of hemodynamically significant aortoiliac disease.  2. High-grade right profunda femoral artery stenosis.  3. Moderate stenosis within the distal bypass graft proximal to the       aneurysmal changes noted within the vein.  4. Diffuse tibial disease.           ______________________________  V. Charlena Cross, MD  Electronically Signed     VWB/MEDQ  D:  03/22/2008  T:  03/22/2008  Job:  [id number]

## 2010-05-27 NOTE — Op Note (Signed)
[name], [name]               ACCOUNT NO.:  [id number]   MEDICAL RECORD NO.:  [id number]          PATIENT TYPE:  INP   LOCATION:  3306                         FACILITY:  [facility]   PHYSICIAN:  Larina Earthly, M.D.    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   PROCEDURE:  Resection of venous bypass aneurysm and interposition graft  with saphenous vein from below-knee to above-knee vein graft.   SURGEON:  Larina Earthly, MD   ASSISTANT:  Della Goo, PA-C   ANESTHESIA:  General endotracheal.   COMPLICATIONS:  None.   DISPOSITION:  Recovery room, stable.   PROCEDURE IN DETAIL:  The patient was taken to the operating room and  placed in supine position.  The area of the right and left legs were  prepped and draped in the usual sterile fashion.  The incision was made  through the old scar in the medial approach of the below-knee popliteal  artery and the vein graft at the below-knee popliteal anastomosis was  dissected free.  The patient did have a very large aneurysm at the level  of the knee.  The separate incision was made over the medial approach of  the above-knee popliteal area on the above-knee position, and this was  carried down to isolate the old vein graft as well.  Ultrasound imaging  revealed that this saphenous vein was patent from the right ankle up to  the calf from the old vein harvest and this vein was harvested from this  area was of good caliber and was adequate length to interposition graft  at the level of the venous aneurysm.  The patient was given 9000 units  of intravenous heparin.  The vein graft was occluded proximally in the  above-knee position and the vein was divided at the level of the knee.  There was some degeneration.  The patient actually had an angioplasty on  this segment of the vein.  On spatulating the vein, there was a tight  stenosis in the vein of this area and it actually thrombosed just with  manipulation of the vein from mobilization.  Vein proximally was of good  caliber and had normal flow.  The vein graft itself was flushed with  heparinized saline.  New interposition graft vein was brought into the  field, was spatulated and sewn end-to-end of the old vein graft with the  running suture 6-0 Prolene suture.  Anastomosis was tested and found to  be adequate.  The vein was then brought through the tunnel behind the  knee to the level of the old below-knee anastomosis.  The vein was  exposed at the old anastomosis and popliteal artery was also exposed  above and below the old venous anastomosis.  The vein was transected  just above the venous anastomosis and a  3-dilator passed easily through  the venous anastomosis without resistance.  The vein was spatulated at  this level.  The venous aneurysm was resected and sent for pathology.  The new interposition vein graft was spatulated and sewn end-to-end of  the old vein graft right at the below-knee popliteal anastomosis with a  running 6-0 Prolene suture.  Prior to completion of the anastomosis, the  usual flushing maneuvers were undertaken.  The anastomosis was completed  and the flow was restored to the right foot.  Good graft-dependent  Doppler flow was noted at the anterior tibial artery.  The patient was  given 50 mg of protamine to reverse the heparin.  Wounds  were irrigated with saline.  Hemostasis with electrocautery.  Wounds  were closed with 3-0 and 2-0 Vicryl subcutaneous tissue and the skin was  closed with 3-0 and 4-0 Vicryl subcuticular stitches.  Sterile dressing  was applied and the patient was taken to the recovery room in stable  condition.      Larina Earthly, M.D.  Electronically  Signed     TFE/MEDQ  D:  07/30/2008  T:  07/31/2008  Job:  [id number]

## 2010-05-27 NOTE — Assessment & Plan Note (Signed)
OFFICE VISIT   [name], [name]  DOB:  1960-03-08                                       12/28/2008  ZOXWR#:[id number]   ATTENDING PHYSICIAN:  Larina Earthly, M.D.   CHIEF COMPLAINT:  New onset right knee pain.  The patient is status post  right fem-pop bypass and resection of venous bypass aneurysm and  interposition graft from below knee to above knee vein graft on the  right.   HISTORY OF PRESENT ILLNESS:  The patient is a 50 year old gentleman who  had a right above knee and below knee fem-pop bypass by Dr. Madilyn Fireman in  June of 1999 and more recently a resection of a venous graft aneurysm  with interposition graft.  He had recent ABIs and duplex in 10/12/2008  which were stable and showed that the bypass was open.  Approximately 2  weeks ago the patient was working on his truck and kneeled down and came  up from a kneeling position and experienced a sharp pain in the medial  aspect of his knee radiating to the mid medial shin.  He had noted some  swelling especially at the end of the day over the last 2 weeks and he  said he has daily pain.  He had no symptoms of claudication type pain.  No calf pain, no cramping.  No numbness or tingling in his foot.  No  pain in his foot.  He states he can only walk about 2 blocks but that is  secondary to hip pain.  He has no rest pain except for in the medial  aspect of his knee.  He states the pain is sharp and shooting in nature.  He noted no change in color.   CHRONIC PROBLEMS:  The patient has hypertension and diabetes for which  he takes his regular medications and they are well-controlled.  He has  bilateral hip pain.  He also had a fractured neck in the past.  The  patient was admitted to rule out DVT in the right lower extremity.   Vascular lab done today.  ABIs were unchanged from 10/12/2008 and at  that point he had, the right was 0.76 and the left was 0.85.  Today on  12/17 the ankle brachial index was  0.69 on the right and 0.66 on the  left.  His posterior tibial pulse was absent.  He had biphasic waveforms  throughout the bypass graft with the posterior tibial artery occluded  mid calf to ankle.  On the left he has moderate disease.   PHYSICAL EXAM:  This is a well-developed, well-nourished gentleman in no  acute distress.  His heart rate is 95.  Blood pressure is 111/73 and  sats were 96 on room air.  The patient was ambulating with a slight  limp.  HEENT is grossly within normal limits.  His right lower  extremity, he had positive pain over the medial meniscus to palpation.  He had negative McMurray's on that side.  He had no swelling or erythema  in the area.  Both feet were warm.  Bilateral toes were cool.  Feet were  pink bilaterally.  He had palpable femoral and popliteal pulses on the  right.  He had a positive DP per Doppler on the right and no PT noted.   Addendum to vascular lab duplex scan  was negative for DVT.   ASSESSMENT:  1. Probable medial meniscal tear on the right lower extremity.  2. Right fem-pop bypass is patent, open and there is no change in the      ankle brachial indices.   PLAN:  1. The patient was advised to see his primary care physician for      further workup for his meniscal tear.  He will take Tylenol 1-2      tablets every 3-4 times a day as needed for the pain.  He can ice      his knee at night.  2. He will follow up in 1 year for ABIs for his bypass graft.  The      patient will return to clinic if his symptoms worsen.   Della Goo, PA-C   Larina Earthly, M.D.  Electronically Signed   RR/MEDQ  D:  12/28/2008  T:  12/29/2008  Job:  [id number]
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
## 2010-05-30 NOTE — Op Note (Signed)
NAMEGAMAL, TODISCO               ACCOUNT NO.:  [id number]   MEDICAL RECORD NO.:  [id number]          PATIENT TYPE:  INP   LOCATION:  3304                         FACILITY:  [facility]   PHYSICIAN:  Stefani Dama, M.D.  DATE OF BIRTH:  [birth year]   DATE OF PROCEDURE:  06/23/2004  DATE OF DISCHARGE:                                 OPERATIVE REPORT   PREOPERATIVE DIAGNOSIS:  Cervical C4-5 traumatic dislocation.   POSTOPERATIVE DIAGNOSIS:  Cervical C4-5 traumatic dislocation.   OPERATIONS:  Anterior cervical decompression C4-C5, arthrodesis with  structural allograft Alphatec plate fixation.   SURGEON:  Stefani Dama, M.D.   FIRST ASSISTANT:  None.   ANESTHESIA:  General endotracheal.   INDICATIONS:  [name] is a 50 year old individual who has had  traumatic dislocation of C4-C5. He was noted by MRI criteria to have grossly  disrupted disk anteriorly with posterior elements that were also disrupted.  It was felt that this was an unstable injury. The patient was neurologically  intact, however, he had severe lung disease. After this condition has been  stabilized medically, he now was taken to the operating room to undergo  surgical decompression of the markedly disrupted disk and stabilization via  an anterior procedure.   PROCEDURE:  The patient was brought to the operating room supine on the  stretcher. After smooth induction of general endotracheal anesthesia he was  placed in 5 pounds of halter traction. Neck was prepped with DuraPrep and  after being cleansed with Hibiclens with screws that were shaved clean. A  transverse incision was made in the superior portion of the neck and this  was taken down through the platysma. Plane between the sternocleidomastoid  and strap muscles dissected bluntly until the prevertebral space was  reached. The first identifiable disk space was noted to be that of C4-C5 on  localizing radiograph. There was noted to be marked amount  of edema in the  anterior longitudinal ligament and the area around the longus coli muscle  was also noted be significantly edematous and thickened. This area was  entered and a 15 blade was then used to open the anterior longitudinal  ligament. The disk was noted be grossly disrupted with large fragments of  disk being removed in a single piece. Once this was removed and the area of  the posterior longitudinal ligament could be inspected. There was noted to  be gross disruption of the disk clear back to the posterior longitudinal  ligament. These were removed in a piecemeal segments and then some  osteophytes from the inferior margin body of C4 and superior margin body of  C5 were removed with a 2 mm Kerrison punch. High-speed air drill was then  used to dissect out into the uncinate processes and smooth these areas  clear. The lateral recesses were each decompressed. Posterior longitudinal  ligament itself was not opened clearly to the dura as there was no trans  ligamentous rupture that could be identified on the MRI itself. Once the  endplates were smoothed clear and the area was decompressed from left-to-  right and forward  to back then the graft was prepared and this was an 8 mm  trans graft that had the serrated ridges shaved off. The center hole was  also enlarged and this was packed with demineralized bone matrix. The  endplates were well decorticated on each side and then the graft with  demineralized matrix was inserted into the interspace and positioned nicely  in the middle of the disk space. The vertebra was then held together with a  standard size 18 mm Alphatec plate and this was placed with two fixed angle  screws in C5 and variable angle screws in C4. Traction was removed. The soft  tissues were checked for  hemostasis and once this was assured the platysma was closed with 3-0 Vicryl  interrupted fashion, 3-0 Vicryl used in subcuticular tissues. Dermabond was  placed on  the skin. The patient tolerated procedure well. He was placed in  the hard cervical collar after dressing was placed on his neck.       HJE/MEDQ  D:  06/23/2004  T:  06/23/2004  Job:  [id number]
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
## 2010-06-13 ENCOUNTER — Encounter: Payer: Self-pay | Admitting: Vascular Surgery

## 2010-06-19 ENCOUNTER — Encounter: Payer: Self-pay | Admitting: Internal Medicine

## 2010-06-24 ENCOUNTER — Encounter: Payer: Medicaid Other | Attending: Physical Medicine & Rehabilitation | Admitting: Physical Medicine & Rehabilitation

## 2010-06-24 DIAGNOSIS — G609 Hereditary and idiopathic neuropathy, unspecified: Secondary | ICD-10-CM

## 2010-06-24 DIAGNOSIS — M79609 Pain in unspecified limb: Secondary | ICD-10-CM | POA: Insufficient documentation

## 2010-06-24 DIAGNOSIS — G90529 Complex regional pain syndrome I of unspecified lower limb: Secondary | ICD-10-CM

## 2010-06-24 DIAGNOSIS — E119 Type 2 diabetes mellitus without complications: Secondary | ICD-10-CM | POA: Insufficient documentation

## 2010-06-24 DIAGNOSIS — M545 Low back pain: Secondary | ICD-10-CM

## 2010-06-24 DIAGNOSIS — Z9889 Other specified postprocedural states: Secondary | ICD-10-CM | POA: Insufficient documentation

## 2010-06-24 DIAGNOSIS — IMO0001 Reserved for inherently not codable concepts without codable children: Secondary | ICD-10-CM | POA: Insufficient documentation

## 2010-06-24 DIAGNOSIS — F17200 Nicotine dependence, unspecified, uncomplicated: Secondary | ICD-10-CM | POA: Insufficient documentation

## 2010-06-24 DIAGNOSIS — X58XXXS Exposure to other specified factors, sequela: Secondary | ICD-10-CM | POA: Insufficient documentation

## 2010-06-24 DIAGNOSIS — I739 Peripheral vascular disease, unspecified: Secondary | ICD-10-CM | POA: Insufficient documentation

## 2010-06-25 NOTE — Assessment & Plan Note (Signed)
[name] is back regarding his left leg pain.  I have reviewed his chart today at his visit with our nurse practitioner last month.  [name] continues to have significant pain in his left lower extremity. He tells me his most recent surgery is about 6 months ago where he had an I and D and hardware removal from his ankle.  Since that point, he has had increased pain in his left foot which is sharp, stabbing, aching.  It is to the point where he can pursue on or have anything to touch his feet particularly his distal foot.  He tells me he has vascular studies planned next month per Dr. Carola Frost.  He takes oxycodone 10 mg 2 tabs q.4 hours at baseline.  He has not had many of these recently doing up to this appointment.  Sleep is poor.  He states that the gabapentin does help some of his pain.  REVIEW OF SYSTEMS:  Notable for the above.  Complains of limb swelling, coughing, wheezing, shortness of breath.  Full 12-point review is in the written health and history section of the chart.  SOCIAL HISTORY:  The patient is divorced, still smoking.  PHYSICAL EXAMINATION:  VITAL SIGNS:  Blood pressure is 171/83, pulse 84, respiratory rate 18, he is satting 96% on room air. GENERAL:  The patient is generally pleasant.  He is in obvious distress. He cannot bear any weight through his left foot.  He has hard time putting on his sandal even.  Left foot is allodynic to touch particularly with the distal dorsum of the foot and the toes as well as some of the metatarsal pads.  He has less tenderness more proximally on the foot.  There is some tenderness over the distal-third of the tibia and fibula.  The area is dark, it is darkened in color.  He has diminished sensory function over the distal foot compared to the right foot.  I could not sense any focal temperature difference.  There was relative swelling.  He did have some ankle dorsiflexion, plantar flexion, though is limited really to about 5-10  degrees of either one. The patient uses a cane to offload the left foot.  ASSESSMENT: 1. Persistent left lower extremity pain related to trauma and multiple     surgeries.  His presentation is most consistent with complex     regional pain syndrome type 2. 2. History of peripheral vascular disease and peripheral neuropathy. 3. Diabetes. 4. Tobacco abuse.  PLAN: 1. We filled his oxycodone 10 mg q.6 hours p.r.n. and we started Opana     ER 10 mg q.12 hours today. 2. We will send patient for a left lumbar sympathetic plexus block. 3. Increase Neurontin to 600 mg q.i.d. until current prescription is     empty and then ultimately to 800 mg q.i.d. 4. Consider tricyclic antidepressant. 5. Consider checking uric acid level. 6. Await results of patient's vascular studies. 7. Once we get his pain level down, we would like to get patient     involved in physical therapy for desensitization     of the leg. 8. We will see him back here in about a month.     Ranelle Oyster, M.D. Electronically Signed    ZTS/MedQ D:  06/24/2010 10:05:56  T:  06/25/2010 00:07:31  Job #:  [id number]  cc:   Doralee Albino. Carola Frost, M.D. Fax: [phone]

## 2010-07-02 ENCOUNTER — Ambulatory Visit: Payer: Medicaid Other | Admitting: Internal Medicine

## 2010-07-10 ENCOUNTER — Ambulatory Visit: Payer: Self-pay | Admitting: Vascular Surgery

## 2010-07-15 ENCOUNTER — Ambulatory Visit: Payer: Self-pay | Admitting: Vascular Surgery

## 2010-07-17 ENCOUNTER — Ambulatory Visit: Payer: Medicaid Other | Admitting: Internal Medicine

## 2010-07-25 ENCOUNTER — Encounter: Payer: Self-pay | Admitting: Internal Medicine

## 2010-07-25 ENCOUNTER — Ambulatory Visit (INDEPENDENT_AMBULATORY_CARE_PROVIDER_SITE_OTHER): Payer: Medicaid Other | Admitting: Internal Medicine

## 2010-07-25 ENCOUNTER — Encounter: Payer: Medicaid Other | Attending: Physical Medicine & Rehabilitation | Admitting: Physical Medicine & Rehabilitation

## 2010-07-25 VITALS — BP 125/80 | HR 90 | Temp 97.7°F | Ht 73.0 in | Wt 188.4 lb

## 2010-07-25 DIAGNOSIS — G609 Hereditary and idiopathic neuropathy, unspecified: Secondary | ICD-10-CM

## 2010-07-25 DIAGNOSIS — K219 Gastro-esophageal reflux disease without esophagitis: Secondary | ICD-10-CM

## 2010-07-25 DIAGNOSIS — M79609 Pain in unspecified limb: Secondary | ICD-10-CM

## 2010-07-25 DIAGNOSIS — F528 Other sexual dysfunction not due to a substance or known physiological condition: Secondary | ICD-10-CM

## 2010-07-25 DIAGNOSIS — Z7902 Long term (current) use of antithrombotics/antiplatelets: Secondary | ICD-10-CM | POA: Insufficient documentation

## 2010-07-25 DIAGNOSIS — M79605 Pain in left leg: Secondary | ICD-10-CM

## 2010-07-25 DIAGNOSIS — E119 Type 2 diabetes mellitus without complications: Secondary | ICD-10-CM

## 2010-07-25 DIAGNOSIS — E785 Hyperlipidemia, unspecified: Secondary | ICD-10-CM

## 2010-07-25 DIAGNOSIS — R490 Dysphonia: Secondary | ICD-10-CM | POA: Insufficient documentation

## 2010-07-25 DIAGNOSIS — F341 Dysthymic disorder: Secondary | ICD-10-CM | POA: Insufficient documentation

## 2010-07-25 DIAGNOSIS — F329 Major depressive disorder, single episode, unspecified: Secondary | ICD-10-CM

## 2010-07-25 DIAGNOSIS — F17200 Nicotine dependence, unspecified, uncomplicated: Secondary | ICD-10-CM | POA: Insufficient documentation

## 2010-07-25 DIAGNOSIS — M545 Low back pain: Secondary | ICD-10-CM

## 2010-07-25 DIAGNOSIS — G90529 Complex regional pain syndrome I of unspecified lower limb: Secondary | ICD-10-CM

## 2010-07-25 DIAGNOSIS — G5770 Causalgia of unspecified lower limb: Secondary | ICD-10-CM | POA: Insufficient documentation

## 2010-07-25 DIAGNOSIS — I739 Peripheral vascular disease, unspecified: Secondary | ICD-10-CM | POA: Insufficient documentation

## 2010-07-25 LAB — GLUCOSE, CAPILLARY: Glucose-Capillary: 111 mg/dL — ABNORMAL HIGH (ref 70–99)

## 2010-07-25 LAB — LIPID PANEL
Cholesterol: 160 mg/dL (ref 0–200)
HDL: 33 mg/dL — ABNORMAL LOW (ref >39)
Total CHOL/HDL Ratio: 4.8 Ratio
Triglycerides: 280 mg/dL — ABNORMAL HIGH (ref <150)
VLDL: 56 mg/dL — ABNORMAL HIGH (ref 0–40)

## 2010-07-25 MED ORDER — RANITIDINE HCL 150 MG PO TABS: 150.0000 mg | ORAL_TABLET | Freq: Two times a day (BID) | ORAL | Status: DC

## 2010-07-25 MED ORDER — METFORMIN HCL 500 MG PO TABS: 500.0000 mg | ORAL_TABLET | Freq: Two times a day (BID) | ORAL | Status: DC

## 2010-07-25 MED ORDER — RANITIDINE HCL 150 MG PO TABS
150.0000 mg | ORAL_TABLET | Freq: Two times a day (BID) | ORAL | Status: DC
Start: 2010-07-25 — End: 2010-07-25

## 2010-07-25 NOTE — Assessment & Plan Note (Signed)
Patient expresses dissatisfaction with previous Urologist.  He states that viagra no longer has any effect for him, and that the vacuum device prescribed by urology was too expensive. -patient given referral for urology at [facility]

## 2010-07-25 NOTE — Assessment & Plan Note (Signed)
Patient notes new 2-day history of voice hoarseness.  No dysphagia, odynophagia, pain, or palpable neck pathology.  Notes 40-60 pack-yrs of smoking.  Likely benign etiology such as GERD vs viral pathology, and patient reports that his symptoms are already improving.  However, given history of smoking, laryngeal cancer must be considered. -patient instructed to return to care if symptoms have not resolved within 1 week -will pursue further imaging and work-up if no improvement

## 2010-07-25 NOTE — Assessment & Plan Note (Signed)
Well-controlled, A1c = 5.8 -continue metformin
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
## 2010-07-25 NOTE — Assessment & Plan Note (Signed)
HISTORY:  Shamarion is Back regarding his leg pain.  He felt that the Opana and oxycodone were helpful as well as Neurontin increase although he was using a few more Opana than it were prescribed being ran out a few days ago.  Dr. Corliss Skains, recommended not to come off the Plavix for his potential sympathetic block due to his carotid stenosis and the patient has not gone for that as result.  He spoke to Dr. Carola Frost as well with a few days after I saw him.  The patient rates his pain 8-9/10, described as sharp, burning, tingling, and aching.  Pain interferes with general activities, relations with others, enjoyment of life on a moderate level.  REVIEW OF SYSTEMS:  Notable for multiple items.  The patient reports depression after his girlfriend left him.  He has had some problems with high sugars.  He has some burning and tingling in the right foot now too.  SOCIAL HISTORY:  Noted above.  He still smoking a pack of cigarettes per day.  PHYSICAL EXAMINATION:  VITAL SIGNS:  Blood pressure is 156/72, pulse 88, respiratory rate 16, and satting 97% on room air. GENERAL:  The patient did anxious appears to be in significant pain. MUSCULOSKELETAL:  Left foot remains allogeneic and discolored.  Slightly cool to touch.  Right leg is unchanged.  He does have some diminished sensation in the right foot as well.  He has no significant swelling noted in either of the foot.  Limited with ankle movement and cannot bear weight in the left leg.  He has problems again putting his sandals on, uses cane to offload the leg.  ASSESSMENT: 1. Persistent left lower extremity pain/complex regional pain syndrome     type 2. 2. History of peripheral vascular disease and peripheral neuropathy. 3. Diabetes. 4. Tobacco abuse. 5. Reactive depression.  PLAN: 1. We will increase Opana ER 20 mg q.12 h.  I refilled oxycodone 10 mg     q.6 h. p.r.n. #120. 2. Continue Neurontin 800 mg q.i.d. 3. Added Pamelor 25 mg  nightly. 4. Added Effexor 37.5 mg b.i.d. for mood and pain.  We will crease to     75 mg b.i.d. after 1 week. 5. I would like ultimately to get the patient to physical therapy for     the left leg.  The pain needs to be improved first, however before     he can tolerate it. 6. Reviewed the patient's controlled substance agreement in the fact     that he needs is to contact us regarding his progress on     medications.  He cannot make decisions on his own regarding     increasing or decreasing meds. 7. May need to consider amputation in the left leg and see substantial     improvement here.  I will speak with Dr. Carola Frost in this regard down     the line.     Ranelle Oyster, M.D. Electronically Signed   ZTS/MedQ D:  07/25/2010 10:04:38  T:  07/25/2010 10:53:53  Job #:  [id number]  cc:   Doralee Albino. Carola Frost, M.D. Fax: [phone]

## 2010-07-25 NOTE — Patient Instructions (Signed)
Continue to monitor your hoarseness.  If your voice is still hoarse in 1 week, or your symptoms are not improving, please return to our office for further testing.  We have restarted your Zantac prescription for indigestion.  Take 1 pill, two times a day for heartburn.  Please schedule your yearly eye exam.  We have sent a referral for Urology at [facility], and will call you with your appointment.  Please return in 3 months for a follow-up appointment.

## 2010-07-25 NOTE — Assessment & Plan Note (Signed)
Patient notes no abdominal pain, but does note occasional heartburn.  He notes that symptoms were better controlled in the past with both pantoprazole and zantac -re-prescribed zantac -will follow-up at next visit
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
## 2010-08-22 ENCOUNTER — Encounter: Payer: Medicaid Other | Attending: Neurosurgery | Admitting: Neurosurgery

## 2010-08-22 DIAGNOSIS — J449 Chronic obstructive pulmonary disease, unspecified: Secondary | ICD-10-CM | POA: Insufficient documentation

## 2010-08-22 DIAGNOSIS — M25579 Pain in unspecified ankle and joints of unspecified foot: Secondary | ICD-10-CM

## 2010-08-22 DIAGNOSIS — G8929 Other chronic pain: Secondary | ICD-10-CM | POA: Insufficient documentation

## 2010-08-22 DIAGNOSIS — G8928 Other chronic postprocedural pain: Secondary | ICD-10-CM | POA: Insufficient documentation

## 2010-08-22 DIAGNOSIS — M25519 Pain in unspecified shoulder: Secondary | ICD-10-CM | POA: Insufficient documentation

## 2010-08-22 DIAGNOSIS — E119 Type 2 diabetes mellitus without complications: Secondary | ICD-10-CM | POA: Insufficient documentation

## 2010-08-22 DIAGNOSIS — R209 Unspecified disturbances of skin sensation: Secondary | ICD-10-CM | POA: Insufficient documentation

## 2010-08-22 DIAGNOSIS — I10 Essential (primary) hypertension: Secondary | ICD-10-CM | POA: Insufficient documentation

## 2010-08-22 DIAGNOSIS — F329 Major depressive disorder, single episode, unspecified: Secondary | ICD-10-CM

## 2010-08-22 DIAGNOSIS — J4489 Other specified chronic obstructive pulmonary disease: Secondary | ICD-10-CM | POA: Insufficient documentation

## 2010-08-22 NOTE — Assessment & Plan Note (Signed)
Account [id number].  This is a patient Dr. Riley Kill seen for leg pain.  He does have some low back pain and neck pain.  Occasionally his left foot is affected by the complex regional pain syndrome and peripheral vascular disease.  He states that Dr. Carola Frost told him that he did not feel like an amputation would help him at this point.  He has discharged him from care right now he just manages his pain with the pain regimen Dr. Riley Kill has him on. He does see his primary care doctor at [facility] which he states he has been assigned a new one that he will see in the next week or two.  He rates his pain today at 8.  It is sharp, stabbing pain with paresthesias.  He does not rate his activity level or what worsens or improves his pain. He states medicine does help verbally.  He uses a cane for ambulation. He climb steps and drives.  He is not employed.  REVIEW OF SYSTEMS:  Notable for the difficulties described above as well as some skin breakdown, blood sugars, limb swelling, coughing, trouble with ambulation, depression.  No anxiety.  No suicidal thoughts or aberrant behaviors.  PAST MEDICAL HISTORY:  Unchanged.  SOCIAL HISTORY:  He is divorced, lives alone.  FAMILY HISTORY:  Unchanged.  PHYSICAL EXAMINATION:  VITAL SIGNS:  His blood pressure is 149/89, pulse 80, respirations 18, O2 sats 98 on room air.  His quadriceps strength is good, 5/5 in both lower extremities.  His sensation is intact.  He is quite swollen.  His left lower extremity is discolored.  He can flex and extend but not to any confrontation. Constitutionally, he is disheveled.  He is alert and oriented x3.  His gait, he has got a severe limp.  ASSESSMENT: 1. Persistent left lower extremity pain. 2. Complex regional pain syndrome type 2, left lower extremity. 3. Peripheral vascular disease of the left lower extremity. 4. Peripheral neuropathy. 5. Tobacco abuse. 6. Depression.  PLAN: 1. Refill Opana 20 mg in the ER one p.o.  q.12 h. 60 with no refill. 2. Oxycodone 10 mg one p.o. q.6 h. p.r.n. 120 with no refill.  He was     warned about early having been out of his pills early and having     low pill count.  He stated understanding.  He knows that he can be     discharged for being early.  He is 3 days early on each one. 3. He will continue to follow up with his new primary care at [facility].     He will see Dr. Riley Kill in the next week for recheck of his foot.     His questions were encouraged and answered.     Parrish Daddario L. Blima Dessert Electronically Signed    RLW/MedQ D:  08/22/2010 14:32:20  T:  08/22/2010 23:29:53  Job #:  [id number]

## 2010-08-27 ENCOUNTER — Encounter: Payer: Medicaid Other | Admitting: Physical Medicine & Rehabilitation

## 2010-09-02 ENCOUNTER — Encounter: Payer: Self-pay | Admitting: Vascular Surgery

## 2010-09-19 ENCOUNTER — Encounter: Payer: Medicaid Other | Attending: Physical Medicine & Rehabilitation | Admitting: Physical Medicine & Rehabilitation

## 2010-09-19 DIAGNOSIS — F3289 Other specified depressive episodes: Secondary | ICD-10-CM | POA: Insufficient documentation

## 2010-09-19 DIAGNOSIS — M25579 Pain in unspecified ankle and joints of unspecified foot: Secondary | ICD-10-CM

## 2010-09-19 DIAGNOSIS — G9050 Complex regional pain syndrome I, unspecified: Secondary | ICD-10-CM

## 2010-09-19 DIAGNOSIS — M545 Low back pain: Secondary | ICD-10-CM

## 2010-09-19 DIAGNOSIS — F329 Major depressive disorder, single episode, unspecified: Secondary | ICD-10-CM | POA: Insufficient documentation

## 2010-09-19 DIAGNOSIS — M79609 Pain in unspecified limb: Secondary | ICD-10-CM | POA: Insufficient documentation

## 2010-09-19 DIAGNOSIS — F17200 Nicotine dependence, unspecified, uncomplicated: Secondary | ICD-10-CM | POA: Insufficient documentation

## 2010-09-19 DIAGNOSIS — G609 Hereditary and idiopathic neuropathy, unspecified: Secondary | ICD-10-CM

## 2010-09-19 DIAGNOSIS — G589 Mononeuropathy, unspecified: Secondary | ICD-10-CM | POA: Insufficient documentation
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
## 2010-09-23 IMAGING — MR MR LUMBAR SPINE W/O CM
4 of 7 series · 19 of 48 positions shown · non-contrast
Comparison: Lumbar spine radiographs 12/25/2007.

CLINICAL DATA: Low back pain.  Bilateral leg pain.  Right leg
weakness.

MRI LUMBAR SPINE WITHOUT CONTRAST
TECHNIQUE: Multiplanar and multiecho pulse sequences of the lumbar
spine were obtained without intravenous contrast.

[Series 3: T2 · sagittal · 4.0mm · 0.57mm/px · 5 of 12 slices shown (1 of 2)]
[im 1/12]
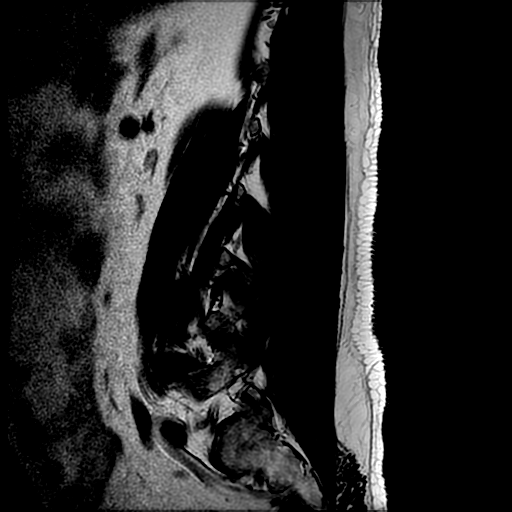
[im 3/12]
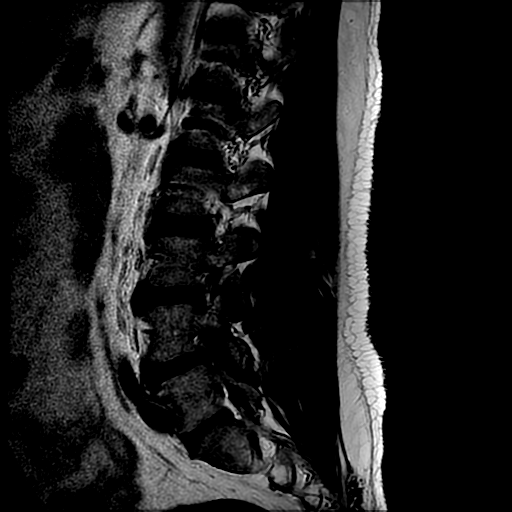
[im 6/12]
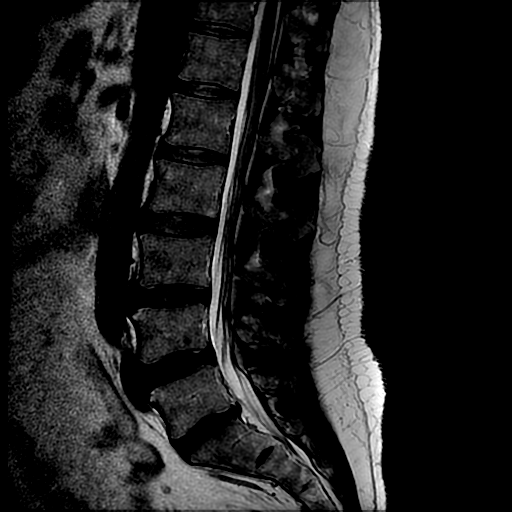
[im 9/12]
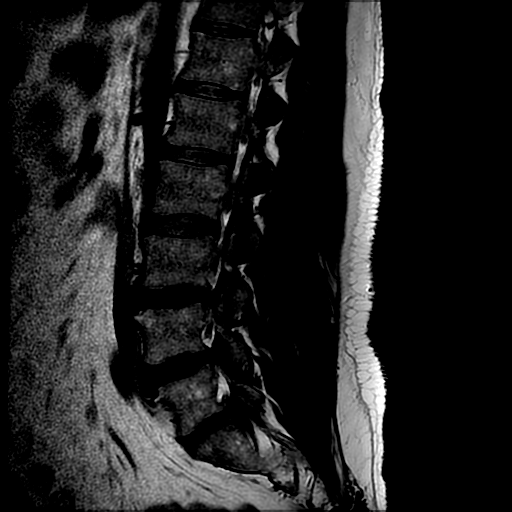
[im 12/12]
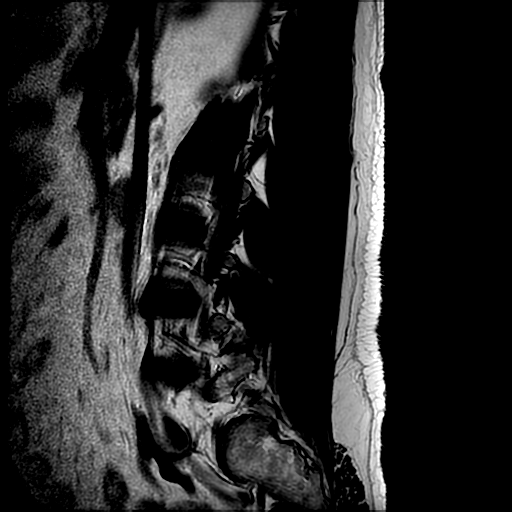

[Series 5: T1 · sagittal · 4.0mm · 0.57mm/px · 3 of 12 slices shown (1 of 2)]
[im 1/12]
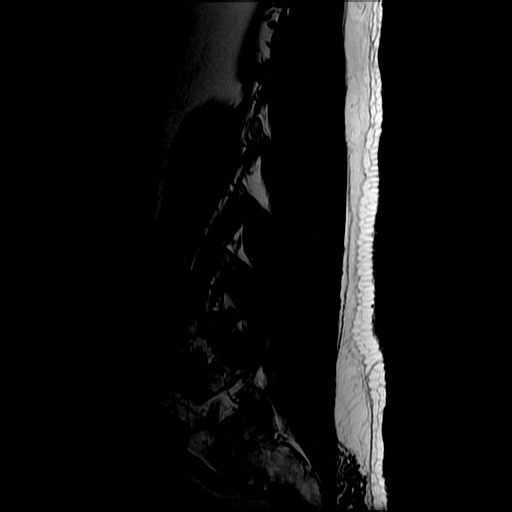
[im 6/12]
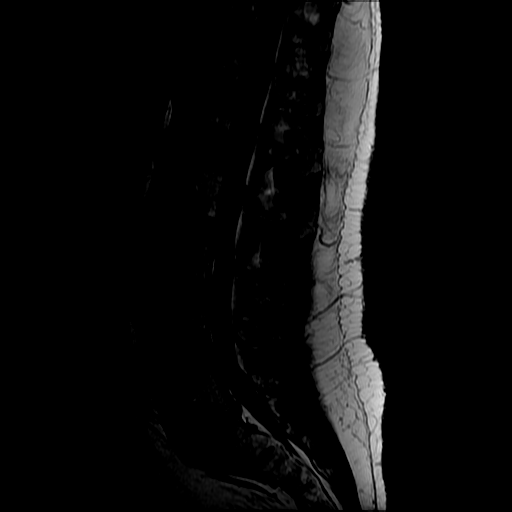
[im 12/12]
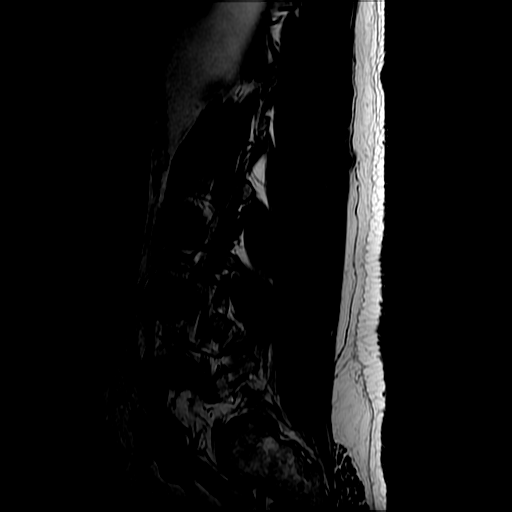

[Series 8: T2 · axial · 4.0mm · 0.39mm/px · z∈[-135,+73]mm · 8 of 26 slices shown (2 of 2)]
[im 1/26]
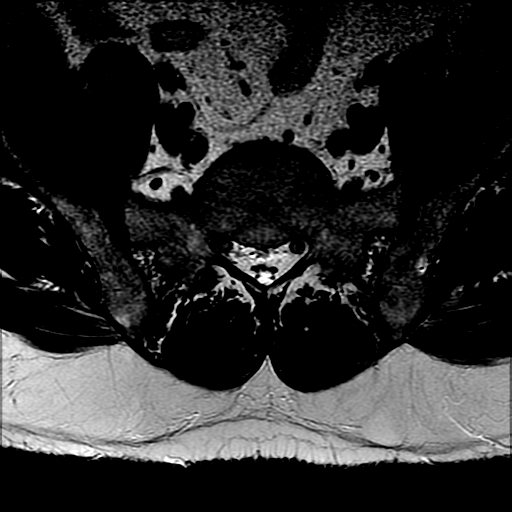
[im 3/26]
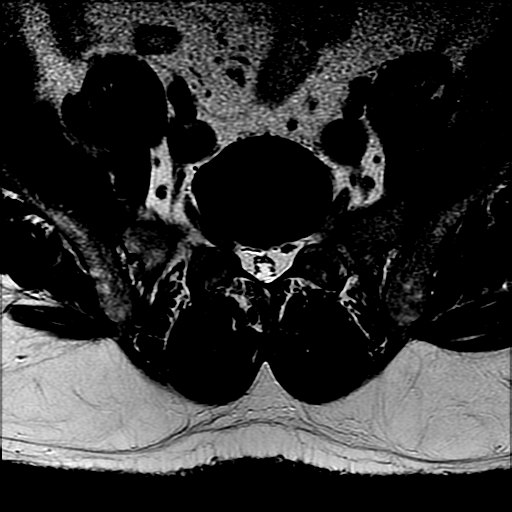
[im 6/26]
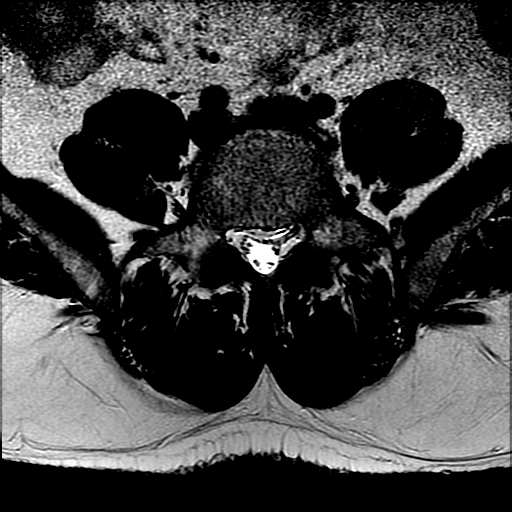
[im 8/26]
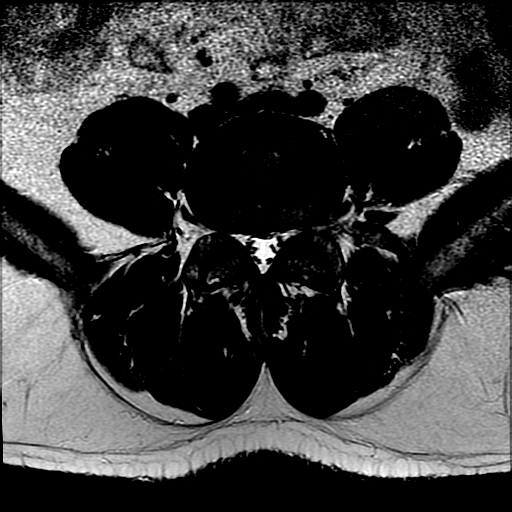
[im 11/26]
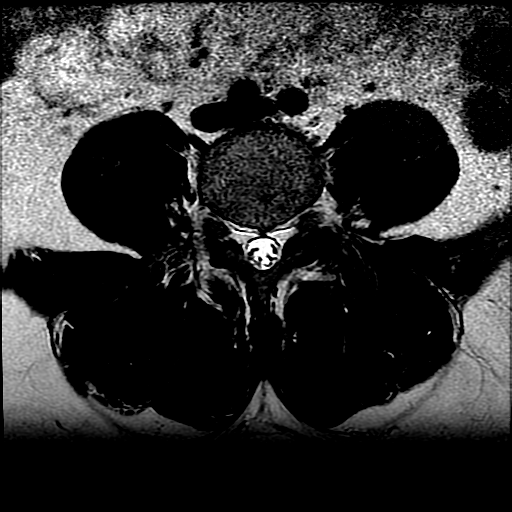
[im 13/26]
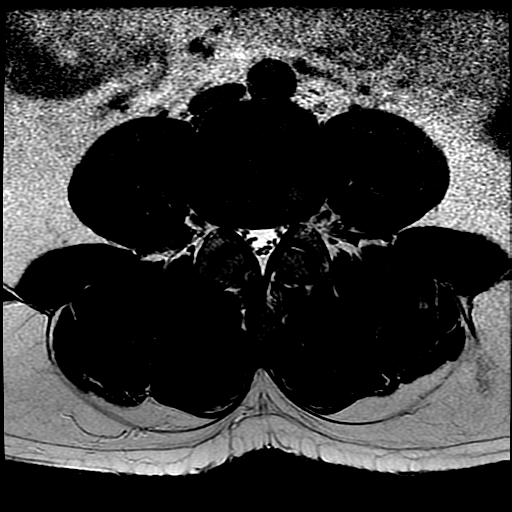
[im 16/26]
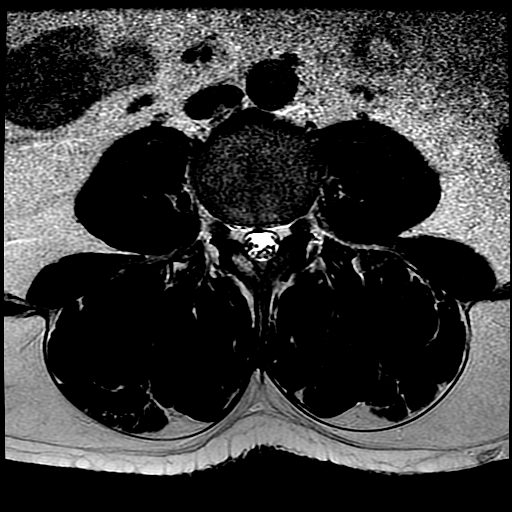
[im 23/26]
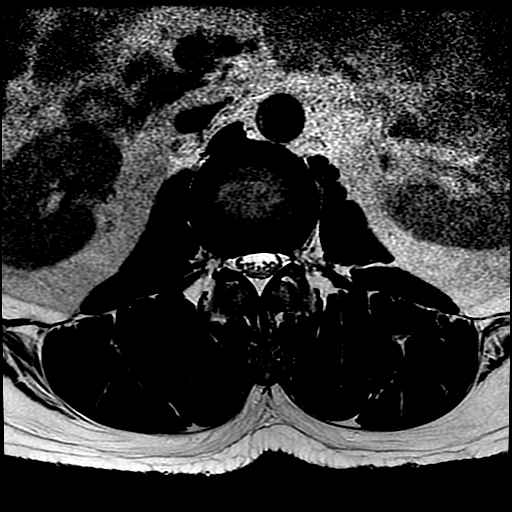

[Series 9: T1 · axial · 4.0mm · 0.39mm/px · z∈[-126,+73]mm · 3 of 26 slices shown (2 of 2)]
[im 3/26]
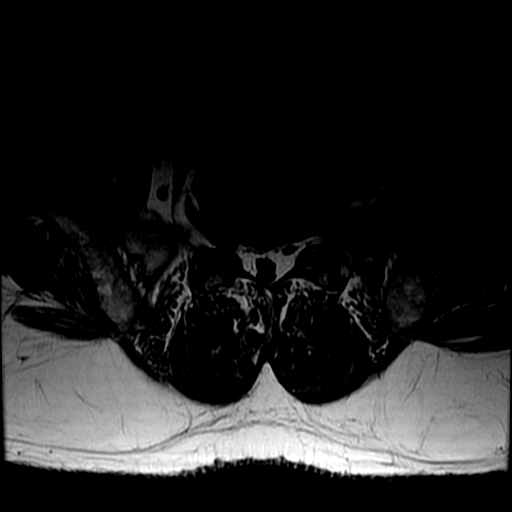
[im 13/26]
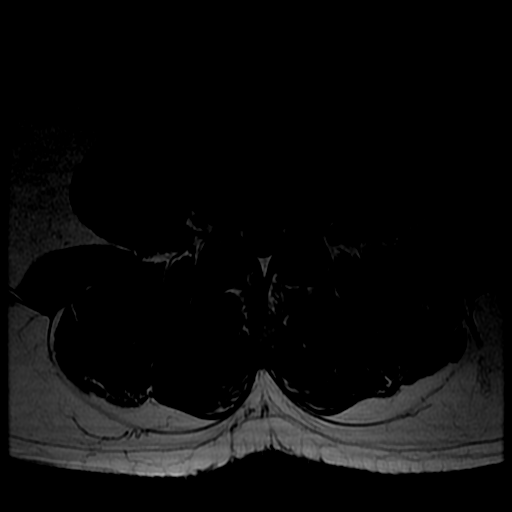
[im 23/26]
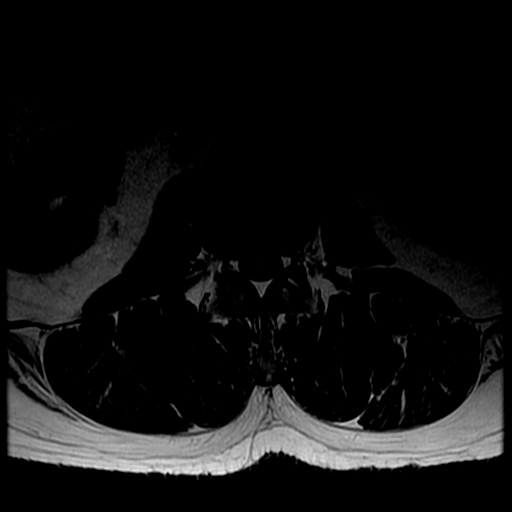

[19 of 48 positions shown; findings below may reference images not displayed]

FINDINGS: This patient has five non-rib-bearing lumbar vertebrae.
Findings are as follows:  The conus medullaris is normal.

L1-2:  Normal disc.  No stenosis.

L2-3:  Normal disc.  No stenosis.

L3-4:  Disc dehydration.  Disc space width preserved.  Mild diffuse
annular bulging.  No HNP.  Mild to moderate stenosis of the medial
aspects of the neural foramina.

L4-5 :  Disc dehydration.  No disc space narrowing.  Annular
bulging.  No focal neural compromise.  Stenosis of the medial
aspect of the foramina.  Encroachment on the exiting L4 nerve
roots.

L5-S1:  Discal dehydration.  Very small central disc herniation
without mass effect.  I believe that the patient has a conjoined
nerve root sleeve for the right L5 and S1 nerve roots.
IMPRESSION: Stenosis of the medial aspects of the neural foramina at L3-4 and
L4-5.  No significant appearing central canal stenosis.  Minimal
central HNP at L5-S1.

## 2010-09-25 ENCOUNTER — Ambulatory Visit (INDEPENDENT_AMBULATORY_CARE_PROVIDER_SITE_OTHER): Payer: Medicaid Other | Admitting: Internal Medicine

## 2010-09-25 ENCOUNTER — Encounter: Payer: Self-pay | Admitting: Internal Medicine

## 2010-09-25 DIAGNOSIS — I739 Peripheral vascular disease, unspecified: Secondary | ICD-10-CM

## 2010-09-25 DIAGNOSIS — I10 Essential (primary) hypertension: Secondary | ICD-10-CM

## 2010-09-25 DIAGNOSIS — Z0000 Encounter for general adult medical examination without abnormal findings: Secondary | ICD-10-CM

## 2010-09-25 DIAGNOSIS — K6289 Other specified diseases of anus and rectum: Secondary | ICD-10-CM | POA: Insufficient documentation

## 2010-09-25 DIAGNOSIS — F329 Major depressive disorder, single episode, unspecified: Secondary | ICD-10-CM

## 2010-09-25 DIAGNOSIS — E119 Type 2 diabetes mellitus without complications: Secondary | ICD-10-CM

## 2010-09-25 MED ORDER — HYDROCORTISONE 1 % EX CREA: TOPICAL_CREAM | CUTANEOUS | Status: DC

## 2010-09-25 MED ORDER — CITALOPRAM HYDROBROMIDE 20 MG PO TABS: 20.0000 mg | ORAL_TABLET | Freq: Every day | ORAL | Status: DC

## 2010-09-25 NOTE — Assessment & Plan Note (Signed)
Blood pressure adequately controlled today, will continue to follow -continue current regimen

## 2010-09-25 NOTE — Assessment & Plan Note (Signed)
Patient reports a history of depression, and wants to start a benzo.  Patient has a history of substance abuse -I spent a large amount of time explaining to the patient that benzo's are not appropriate treatment for depression, and are not useful long-term, and that their addictive potential would be more harmful than beneficial to this patient in the long-run.  The patient seemed only mildly receptive to this message. -we will start Citalopram, and follow-up with this patient in a couple of weeks.

## 2010-09-25 NOTE — Assessment & Plan Note (Signed)
Patient reports significant left leg pain from PVD, despite management at the pain clinic by Dr. Riley Kill. -continue aspirin and plavix, and pain management per Dr. Riley Kill -another appointment was made with Dr. Arbie Cookey for 9/25, to follow-up and discuss surgical options as indicated

## 2010-09-25 NOTE — Patient Instructions (Signed)
For your depression, we are starting you on a medication called Celexa.  Take 1 tablet per day for depression.  It may take up to 4 weeks to see the full effect of this medication.  For your rectal pain, we are prescribing a cream, Preparation H, that you may use short-term to relieve the pain  For your peripheral vascular disease, please keep your appointment with Dr. Arbie Cookey  on 9/25 to discuss surgical treatment options -continue to see Dr. Riley Kill for pain management  Please return for a follow-up appointment in 2-3 weeks.

## 2010-09-25 NOTE — Assessment & Plan Note (Signed)
-  patient refused colonoscopy.  He was given FOBC's.

## 2010-09-25 NOTE — Progress Notes (Signed)
Dr. Manson Passey and I discussed [name] at length. I agree with his assessment and plan.

## 2010-09-25 NOTE — Assessment & Plan Note (Signed)
Patient reports intermittent rectal pain on 2-3 occasions over the last couple of months. -prescribed Preparation H to apply as needed to the affected area for symptomatic relief

## 2010-09-25 NOTE — Progress Notes (Signed)
HPI The patient is a 50 yo man, history of DM, HL, OSA, HTN, PVD, and COPD, presenting for a follow-up visit.  The patient was seen 2 months ago and reported acute horseness of voice, which resolved a few days after that appointment.  The patient was also re-prescribed Zantac for symptomatic GERD, which has relieved these symptoms.  The patient presents today with a complaint of "depression", citing anhedonia, lack of concentration, difficulty sleeping, and fatigue.  He reports that clonazepam and ativan are the only medications that have helped his depression in the past, and that he has tried several others that have not worked.  He notes no suicidal or homicidal ideation.  The patient also has a history of severe left foot pain secondary to PVD.  The patient has previously undergone right leg grafts in 1999 and 2010 for PVD, which have significantly relieved his symptoms in that foot.  His left foot pain is currently managed by Dr. Riley Kill at the Pain Clinic, though the patient notes that his pain has been worsening despite these medications.  He reports that he has been discussing surgical intervention on his left leg with Dr. Arbie Cookey, but that he missed an appointment with this physician a few weeks ago.  The patient also reports a new rectal pain during sexual intercourse, which he has experienced 2-3 times over the last few months.  He notes no rectal pain with bowel movements, and no constipation or recent change in bowel habits.  ROS: General: no fevers, chills, changes in weight, changes in appetite Skin: no rash HEENT: no blurry vision, hearing changes, sore throat Pulm: no dyspnea, coughing, wheezing CV: no chest pain, palpitations, shortness of breath Abd: no abdominal pain, nausea/vomiting, diarrhea/constipation GU: no dysuria, hematuria, polyuria Ext: see HPI Neuro: no weakness, numbness, or tingling  Filed Vitals:   09/25/10 0841  BP: 133/81  Pulse: 71  Temp: 97.1 F (36.2 C)     PEX General: alert, cooperative, and in no apparent distress HEENT: pupils equal round and reactive to light, vision grossly intact, oropharynx clear and non-erythematous  Neck: supple, no lymphadenopathy, JVD, or carotid bruits Lungs: clear to ascultation bilaterally, normal work of respiration, no wheezes, rales, ronchi Heart: regular rate and rhythm, no murmurs, gallops, or rubs Abdomen: soft, non-tender, non-distended, normal bowel sounds Msk: no joint edema, warmth, or erythema Extremities: left foot hyperpigmented to the level of his shin, exquisitely tender to touch, with faint DP/PT pulse.  Right foot without hyperpigmentation or tenderness, and with DP/PT pulse 2+. Neurologic: alert & oriented X3, cranial nerves II-XII intact, strength grossly intact, sensation intact to light touch  Assessment/Plan

## 2010-10-06 ENCOUNTER — Encounter: Payer: Self-pay | Admitting: Vascular Surgery

## 2010-10-07 ENCOUNTER — Encounter (INDEPENDENT_AMBULATORY_CARE_PROVIDER_SITE_OTHER): Payer: Medicaid Other

## 2010-10-07 ENCOUNTER — Other Ambulatory Visit: Payer: Self-pay | Admitting: Internal Medicine

## 2010-10-07 ENCOUNTER — Encounter: Payer: Self-pay | Admitting: Vascular Surgery

## 2010-10-07 ENCOUNTER — Ambulatory Visit (INDEPENDENT_AMBULATORY_CARE_PROVIDER_SITE_OTHER): Payer: Medicaid Other | Admitting: Vascular Surgery

## 2010-10-07 VITALS — BP 111/71 | HR 75 | Resp 20 | Ht 73.0 in | Wt 180.0 lb

## 2010-10-07 DIAGNOSIS — Z48812 Encounter for surgical aftercare following surgery on the circulatory system: Secondary | ICD-10-CM

## 2010-10-07 DIAGNOSIS — I6523 Occlusion and stenosis of bilateral carotid arteries: Secondary | ICD-10-CM

## 2010-10-07 DIAGNOSIS — I739 Peripheral vascular disease, unspecified: Secondary | ICD-10-CM

## 2010-10-07 DIAGNOSIS — I70309 Unspecified atherosclerosis of unspecified type of bypass graft(s) of the extremities, unspecified extremity: Secondary | ICD-10-CM

## 2010-10-07 DIAGNOSIS — I6529 Occlusion and stenosis of unspecified carotid artery: Secondary | ICD-10-CM

## 2010-10-07 DIAGNOSIS — I70219 Atherosclerosis of native arteries of extremities with intermittent claudication, unspecified extremity: Secondary | ICD-10-CM
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
## 2010-10-16 ENCOUNTER — Encounter: Payer: Medicaid Other | Attending: Neurosurgery | Admitting: Neurosurgery

## 2010-10-16 DIAGNOSIS — F329 Major depressive disorder, single episode, unspecified: Secondary | ICD-10-CM

## 2010-10-16 DIAGNOSIS — F3289 Other specified depressive episodes: Secondary | ICD-10-CM | POA: Insufficient documentation

## 2010-10-16 DIAGNOSIS — I739 Peripheral vascular disease, unspecified: Secondary | ICD-10-CM | POA: Insufficient documentation

## 2010-10-16 DIAGNOSIS — M79609 Pain in unspecified limb: Secondary | ICD-10-CM | POA: Insufficient documentation

## 2010-10-16 DIAGNOSIS — F17200 Nicotine dependence, unspecified, uncomplicated: Secondary | ICD-10-CM | POA: Insufficient documentation

## 2010-10-16 DIAGNOSIS — M25579 Pain in unspecified ankle and joints of unspecified foot: Secondary | ICD-10-CM

## 2010-10-16 DIAGNOSIS — G589 Mononeuropathy, unspecified: Secondary | ICD-10-CM | POA: Insufficient documentation

## 2010-10-16 NOTE — Assessment & Plan Note (Signed)
Account [id number].  This is a patient of Dr. Riley Kill who is seen for his left leg pain.  He states his pain is unchanged.  He still has a lot of pain and discoloration in the left lower extremity, but he states his feels like it is never going to change at this point.  He rates his average pain is 8.  It is a sharp, stabbing, tingling, and aching pains.  Activity level is 1 to 4.  Sleep patterns are poor.  Pain is worse in the morning. Walking, bending, activity, and standing aggravate.  Rest and medication help.  He uses a cane for ambulation.  He does climb steps and drive. He can walk about 15 minutes at a time.  He is not employed.  REVIEW OF SYSTEMS:  Notable for difficulties described above as well as weakness, numbness, tingling, dizziness, trouble with ambulation, confusion, depression, anxiety, no suicidal thoughts or aberrant behaviors.  He has some night sweats.  Skin breakdown and no open wounds on his leg at this time some easy bleeding tendencies, limb swelling, coughing, sleep apnea, shortness of breath, and wheezing.  PAST MEDICAL HISTORY, SOCIAL HISTORY, AND FAMILY HISTORY:  Unchanged.  PHYSICAL EXAMINATION:  VITAL SIGNS:  His blood pressure is 109/65, pulse 78, respirations 16, O2 sats 95 on room air.  His motor strength is quite diminished in the left lower extremity. Sensation is diminished.  His overall appearance is quite disheveled. He appears somewhat depressed and irritable.  Constitutionally, he is within normal limits.  He is alert and oriented x3.  He has a significant limp with a single-point cane.  ASSESSMENT: 1. Chronic left lower extremity pain with complex regional pain     syndrome and peripheral vascular disease. 2. Tobacco abuse. 3. Depression.  PLAN: 1. Refill Opana 20 mg one p.o. q.12 h., 60 with no refill. 2. Oxycodone 10 mg one p.o. q.6 h. p.r.n., 120 with no refill.  His questions were encouraged and answered.  He will follow up here in the  clinic in a month.     Deshundra Waller L. Blima Dessert Electronically Signed    RLW/MedQ D:  10/16/2010 13:10:33  T:  10/16/2010 22:19:30  Job #:  [id number]

## 2010-10-27 ENCOUNTER — Other Ambulatory Visit: Payer: Self-pay | Admitting: *Deleted

## 2010-10-27 DIAGNOSIS — E119 Type 2 diabetes mellitus without complications: Secondary | ICD-10-CM

## 2010-10-27 LAB — PROTIME-INR: Prothrombin Time: 26.8 — ABNORMAL HIGH

## 2010-10-27 NOTE — Telephone Encounter (Signed)
Called in refill that was done in 09/2010.  Pt had not taken in the prescription.

## 2010-10-28 ENCOUNTER — Ambulatory Visit (INDEPENDENT_AMBULATORY_CARE_PROVIDER_SITE_OTHER): Payer: Medicaid Other | Admitting: *Deleted

## 2010-10-28 DIAGNOSIS — Z23 Encounter for immunization: Secondary | ICD-10-CM

## 2010-10-29 NOTE — Procedures (Unsigned)
BYPASS GRAFT EVALUATION  INDICATION:  Right lower extremity bypass graft.  HISTORY: Diabetes:  Yes. Cardiac:  No. Hypertension:  Yes. Smoking:  Previous. Previous Surgery:  Right fem-pop bypass graft in 1999, right popliteal interpositional graft in 07/2008.  SINGLE LEVEL ARTERIAL EXAM                              RIGHT              LEFT Brachial: Anterior tibial: Posterior tibial: Peroneal: Ankle/brachial index:  PREVIOUS ABI:  Date:  RIGHT:  LEFT:  LOWER EXTREMITY BYPASS GRAFT DUPLEX EXAM:  DUPLEX:  Monophasic Doppler waveforms noted throughout the right lower extremity bypass graft with no increased velocities noted.  The right proximal to mid bypass graft demonstrates an irregular contour with mild nonocclusive plaque noted.  IMPRESSION: 1. Patent right femoropopliteal bypass graft with no increased     velocities, irregular appearance of the proximal graft level, as     described above. 2. Ankle brachial indices are noted on the separate report.  ___________________________________________ Larina Earthly, M.D.  CH/MEDQ  D:  10/09/2010  T:  10/09/2010  Job:  [id number]

## 2010-10-30 ENCOUNTER — Other Ambulatory Visit: Payer: Self-pay | Admitting: Internal Medicine

## 2010-11-05 MED ORDER — METFORMIN HCL 500 MG PO TABS: 500.0000 mg | ORAL_TABLET | Freq: Two times a day (BID) | ORAL | Status: DC

## 2010-11-12 DIAGNOSIS — N529 Male erectile dysfunction, unspecified: Secondary | ICD-10-CM | POA: Insufficient documentation

## 2010-11-13 ENCOUNTER — Encounter: Payer: Medicaid Other | Admitting: Neurosurgery

## 2010-11-24 ENCOUNTER — Encounter: Payer: Medicaid Other | Attending: Neurosurgery | Admitting: Neurosurgery

## 2010-11-24 DIAGNOSIS — F329 Major depressive disorder, single episode, unspecified: Secondary | ICD-10-CM

## 2010-11-24 DIAGNOSIS — G90529 Complex regional pain syndrome I of unspecified lower limb: Secondary | ICD-10-CM

## 2010-11-24 DIAGNOSIS — G589 Mononeuropathy, unspecified: Secondary | ICD-10-CM | POA: Insufficient documentation

## 2010-11-24 DIAGNOSIS — M79609 Pain in unspecified limb: Secondary | ICD-10-CM | POA: Insufficient documentation

## 2010-11-24 DIAGNOSIS — I739 Peripheral vascular disease, unspecified: Secondary | ICD-10-CM | POA: Insufficient documentation

## 2010-11-24 DIAGNOSIS — G894 Chronic pain syndrome: Secondary | ICD-10-CM

## 2010-11-24 DIAGNOSIS — G4730 Sleep apnea, unspecified: Secondary | ICD-10-CM | POA: Insufficient documentation

## 2010-11-24 DIAGNOSIS — F17200 Nicotine dependence, unspecified, uncomplicated: Secondary | ICD-10-CM | POA: Insufficient documentation

## 2010-11-24 DIAGNOSIS — G8929 Other chronic pain: Secondary | ICD-10-CM | POA: Insufficient documentation

## 2010-11-24 DIAGNOSIS — F3289 Other specified depressive episodes: Secondary | ICD-10-CM | POA: Insufficient documentation

## 2010-11-24 NOTE — Assessment & Plan Note (Signed)
This is a patient of Dr. Riley Kill, seen for his left leg pain.  He reports no change in his pain at this point.  He rates it as 6/10.  It is a sharp, stabbing, tingling, and aching pain.  General activity level is 9- 10.  Pain is worse in the morning.  Walking, bending, standing tend to aggravate.  Medication helps.  He uses a cane for ambulation.  He does climb steps and drive.  He is not employed.  REVIEW OF SYSTEMS:  Notable for difficulties as described above as well as some weight loss,  skin breakdown, easy bleeding, high and low blood sugar fluctuations, limb swelling, coughing, sleep apnea, shortness of breath.  He has some spasms, dizziness, depression, anxiety.  No suicidal thoughts or aberrant behaviors.  Last pill count on UDS consistent.  Past medical history, social history, and family history unchanged.  PHYSICAL EXAMINATION:  VITAL SIGNS:  His blood pressure is 118/62, pulse 86, respirations 16, O2 sats 96 on room air. MUSCULOSKELETAL:  His motor strength and sensation are intact, but he has diminished in left lower extremity with strength. NEUROLOGIC:  Constitutionally, he is within normal limits.  He is alert and oriented x3.  Has a shuffled limp gait.  ASSESSMENT: 1. Chronic left lower extremity pain, complex regional pain syndrome     with peripheral vascular disease that is stable according to his     vascular surgeon. 2. Tobacco abuse. 3. Depression.  PLAN: 1. Refill Opana 20 mg 1 p.o. q.12 hours, 60 with no refill. 2. Oxycodone 10 mg 1 p.o. q.6 hours p.r.n., 120 with no refills.     Questions were encouraged and answered.  I will see him in a month.     Makael Stein L. Blima Dessert Electronically Signed    RLW/MedQ D:  11/24/2010 14:32:36  T:  11/24/2010 21:13:09  Job #:  [id number]

## 2010-12-11 IMAGING — CT CT ANGIO NECK
1 of 13 series · 1 of 33 positions shown · IV contrast (APPLIED)
Comparison: none

[Series 3: head w/o-angle 4.8 h45s st · axial · non-contrast · 0.45mm/px · 1 of 36 slices shown]
[im 1/36  soft-tissue]
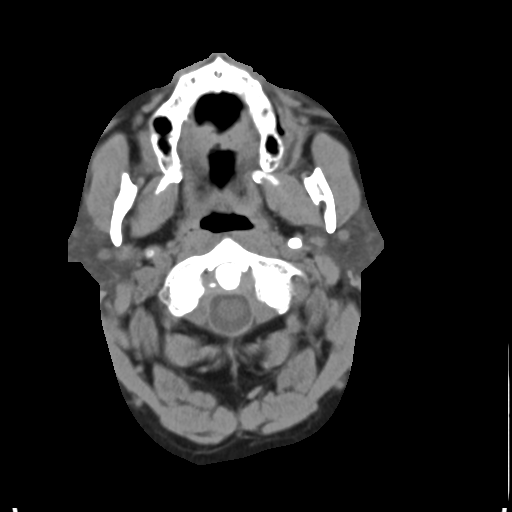

[1 of 33 positions shown; findings below may reference images not displayed]

August 20, 2008 – DUPLICATE COPY for exam association in [facility]. No change from original report.
 Clinical history. Acute onset of aphasia and right-sided facial
 droop

 Examination CT angiogram of the neck, CT angiogram of the brain and
 CT cerebral perfusion maps.

 Approximately 150 ml of Omnipaque 350 was injected intravenously
 into the right antecubital vein

 There is slight axial images were then performed from the thoracic
 inlet to the cranial vertex. Reformation images in the axial,
 coronal and its sagittal planes were then obtained of the vessels
 in the neck and brain.

 The patient tolerated the procedure well. There were no acute
 complications.

 Findings.
 Normal opacification of the innominate artery, the right common
 carotid artery , the right subclavian artery, the left common
 carotid artery and the left subclavian artery is seen.

 Both vertebral arteries are seen to be normal at their origins.
 The vessels are seen to opacify normally to the cranial skull base.
 There is normal opacification of both posterior-inferior cerebellar
 arteries.

 The vertebrobasilar junctions are also seen to be normally
 opacified to form the basilar artery

 The right common carotid bifurcation demonstrates minimal narrowing
 of the proximal right external carotid artery. Its branches
 otherwise are normally opacified.

 The right internal carotid artery has a smooth shallow plaque along
 the posterior wall just distal to the bulb. No significant
 narrowing by the NASCET criteria is seen. The vessel is otherwise
 seen to opacify normally to the cranial skull base. The opacified
 portions of the petrous cavernous and the supraclinoid segments are
 normal.

 The left common carotid bifurcation demonstrates the left external
 carotid artery and its major branches to be normally patent.

 The left internal carotid at the bulb demonstrates severe pre
 occlusive stenosis with a string like sign of opacification which
 appears to extend to the cranial skull base. This is seen on the
 sagittal images but NOT on the axial images. Significant reduced
 caliber of the left internal carotid artery in the petrous and
 cavernous segments is noted.

 Impression
 1. Severe near total occlusion of the left internal carotid at the
 bulb with a possible string sign in the cervical ICA.
 2. Decreased caliber of the left internal carotid artery in the
 cavernous segment which may represent an attempted reconstitution
 from the external carotid artery branches.
 3. Mild atherosclerotic disease of the proximal right internal
 carotid artery.

 CTA of the brain

 The vertebrobasilar junctions including the posterior inferior
 cerebellar arteries bilaterally demonstrate wide patency.

 The basilar artery, the posterior cerebral arteries, the superior
 cerebellar arteries and the anterior-inferior cerebellar arteries
 demonstrate normal opacification.

 The posterior cerebral arteries to the P2 P3 segments are widely
 patent.

 The right internal carotid artery in the distal cavernous and
 supraclinoid segments is normally opacified.

 The right middle and the right anterior cerebral arteries are seen
 to opacify normally into the delayed arterial phase.

 The left internal carotid artery in the supraclinoid segment
 demonstrates near normal flow. The left anterior cerebral artery
 demonstrates normal opacification.

 The left middle cerebral artery M1 segment is widely patent. The
 left anterior temporal branches are normally opacified.

 The inferior division of the left middle cerebral artery proximally
 has a focal area of a filling defect with relative decreased
 hemodynamic flow into the cortical vessels distally.

 Significantly attenuated caliber of the inferior division in the M2
 region is noted. However flow is noted distal to this also.

 There is decreased density in the peri insular region adjacent to
 these abnormal vascular areas suggestive of edema and/or evolving
 ischemia.

 Impression.
 1. Focal filling defect in the inferior division of the left
 middle cerebral artery proximally suggestive of a nonocclusive
 thrombus.
 2. Attenuated caliber of the superior division of the left middle
 cerebral artery proximally suggestive of also of attempted
 recanalization.
 3. Evolving ischemia with edema in the anterior and the posterior
 opercular peri insular areas on the left.

 The CT cerebral perfusion studies

 CT perfusion maps demonstrate significantly reduced blood volumes
 in the anterior and the peri insular areas on the left associated
 with significantly decreased blood perfusion in the same area.
 Increased time to peak is noted in the left MCA distribution
 extending from the watershed area of the left ACA and MCA territory
 to the left MCA PCA distribution.

 Impression
 1. Findings above consistent with an irreversible ischemic area
 involving the left peri insular cortex and the anterior operculum
 with delayed perfusion in the left MCA distribution which would be
 consistent with the near complete occlusion of the left internal
 carotid artery at the bulb.
 2. The blood volume cerebral perfusion maps of the left cerebral
 hemisphere otherwise matches the contralateral cerebral blood
 volume qualitatively.

## 2010-12-11 IMAGING — CT CT HEAD W/O CM
1 series · 16 of 30 positions shown, 20 images · non-contrast
Comparison: 09/04/2007.

CLINICAL DATA: Onset of slurred speech and right facial droop.

CT HEAD WITHOUT CONTRAST
TECHNIQUE: Contiguous axial images were obtained from the base of
the skull through the vertex without contrast.

[Series 2: head routine 4.8 h37s · axial · 0.49mm/px · z∈[-121,+12]mm · 16 of 30 slices shown, 20 images]
[im 2/30  brain]
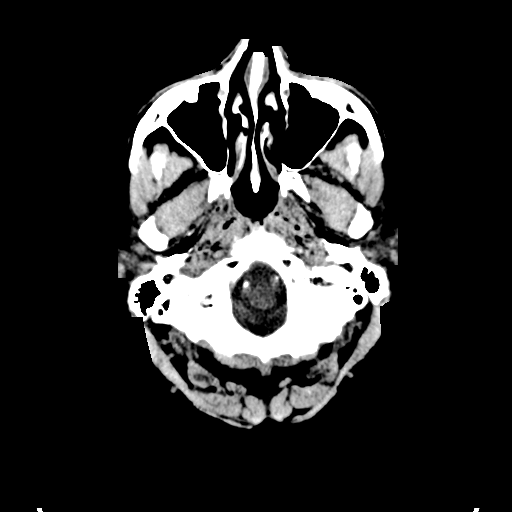
[im 2/30  bone]
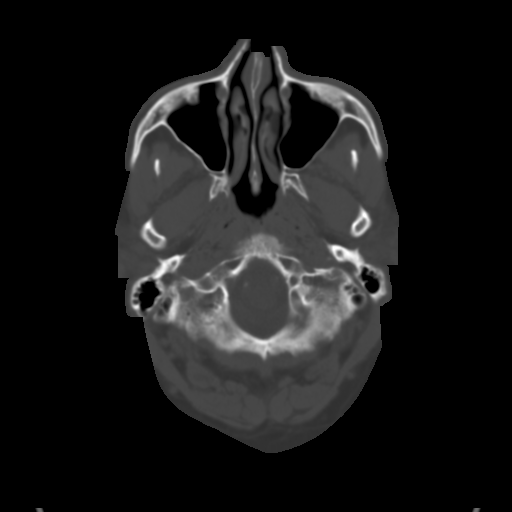
[im 4/30  brain]
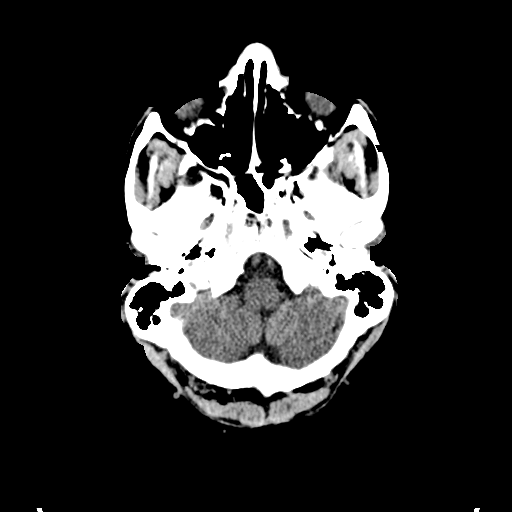
[im 6/30  brain]
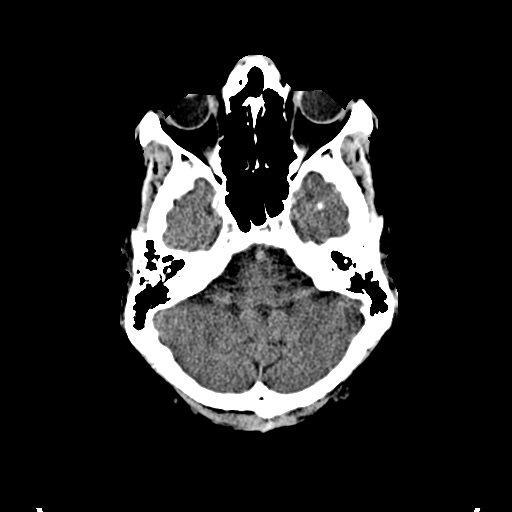
[im 8/30  brain]
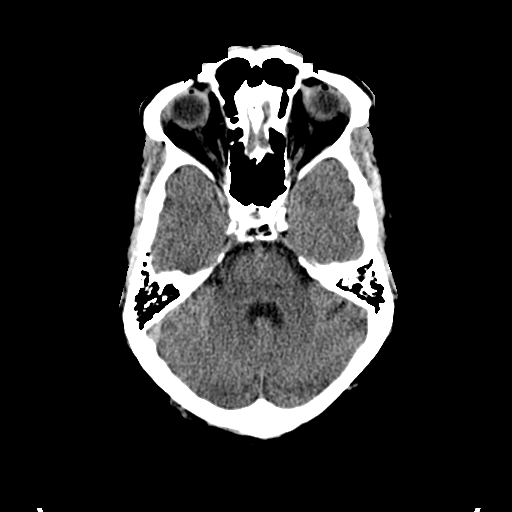
[im 9/30  brain]
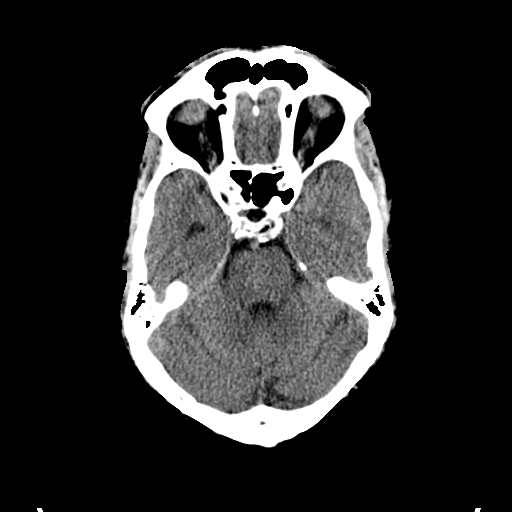
[im 9/30  bone]
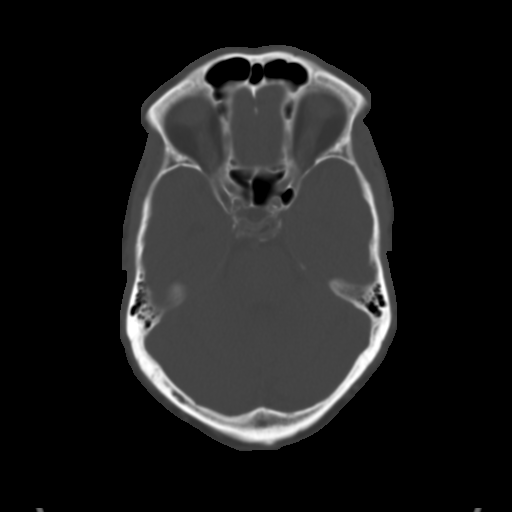
[im 11/30  brain]
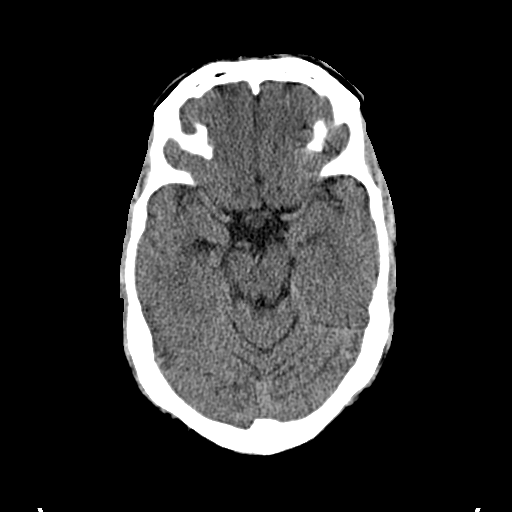
[im 13/30  brain]
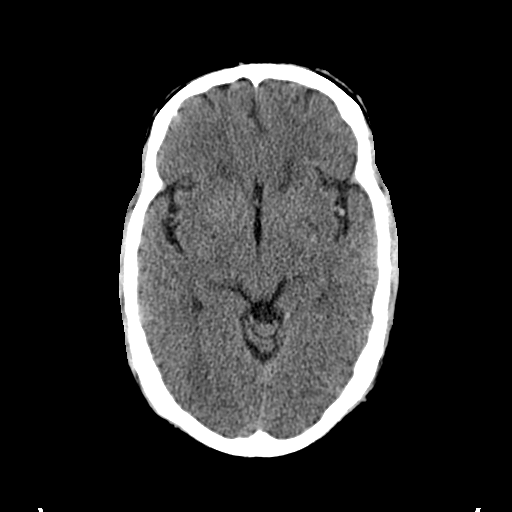
[im 15/30  brain]
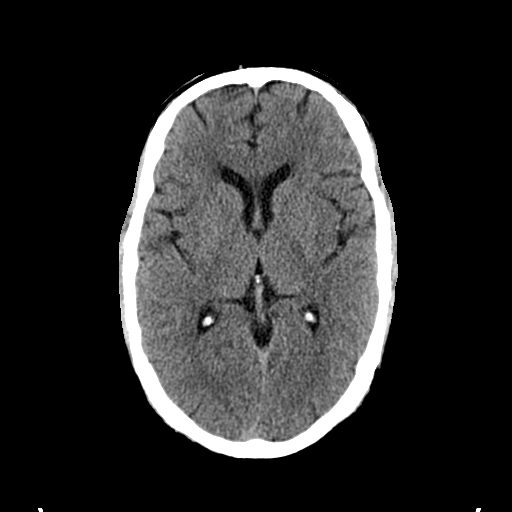
[im 16/30  brain]
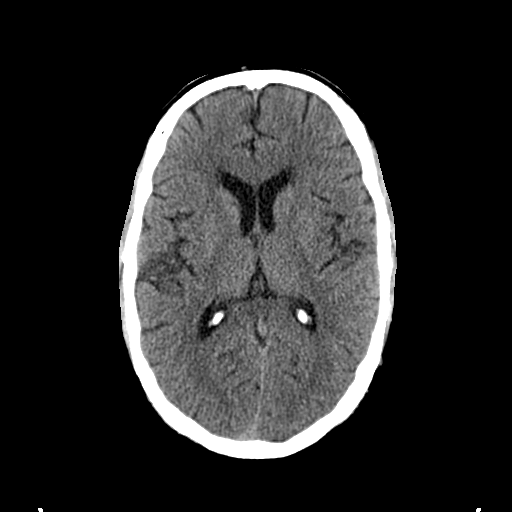
[im 16/30  bone]
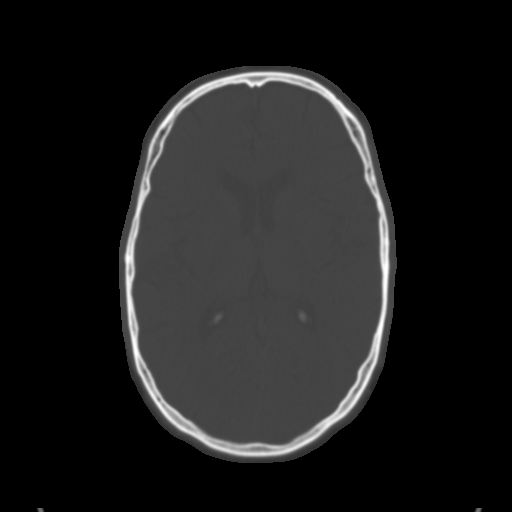
[im 18/30  brain]
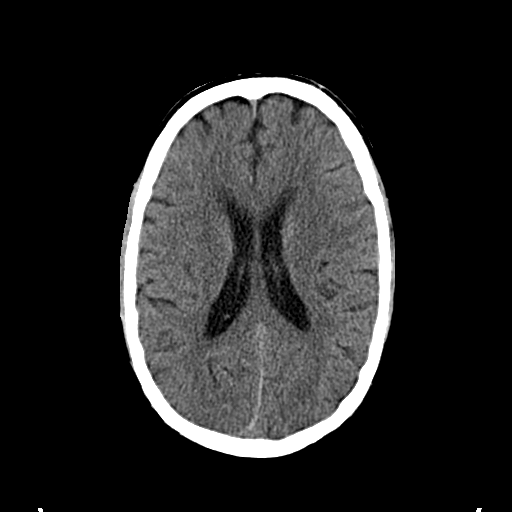
[im 20/30  brain]
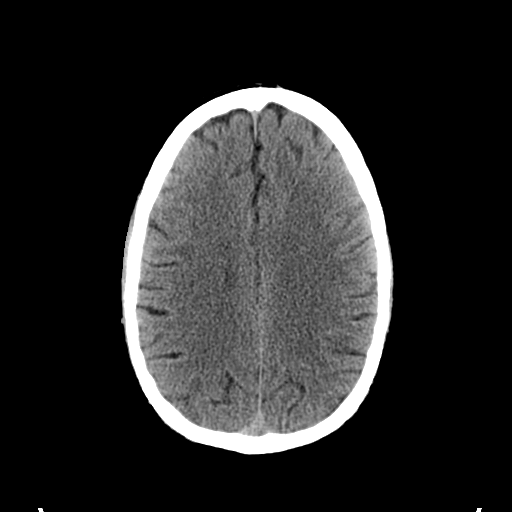
[im 22/30  brain]
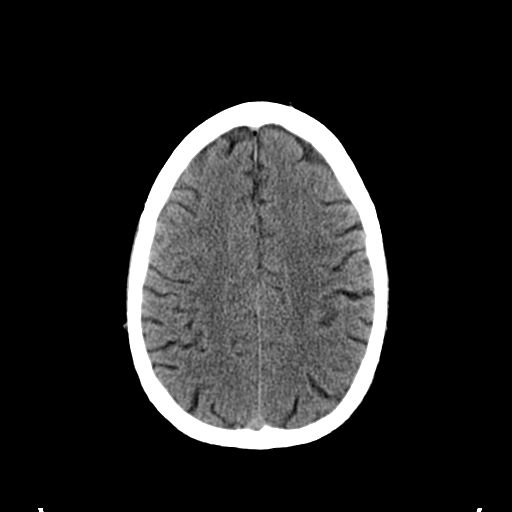
[im 23/30  brain]
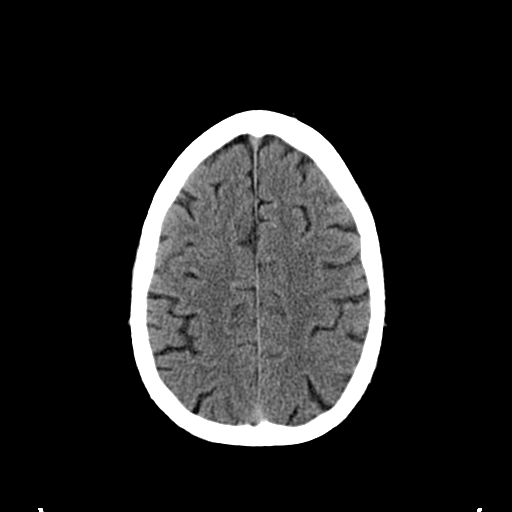
[im 23/30  bone]
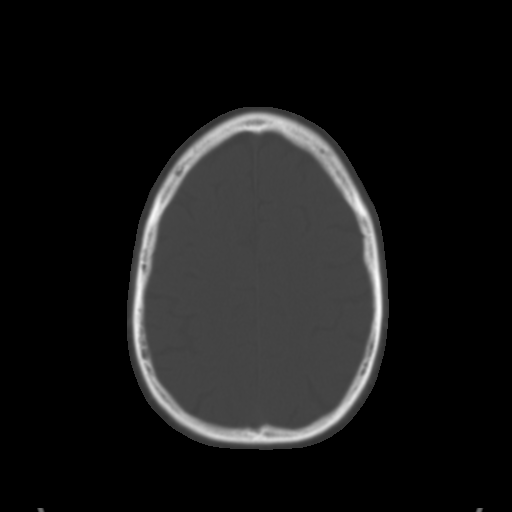
[im 25/30  brain]
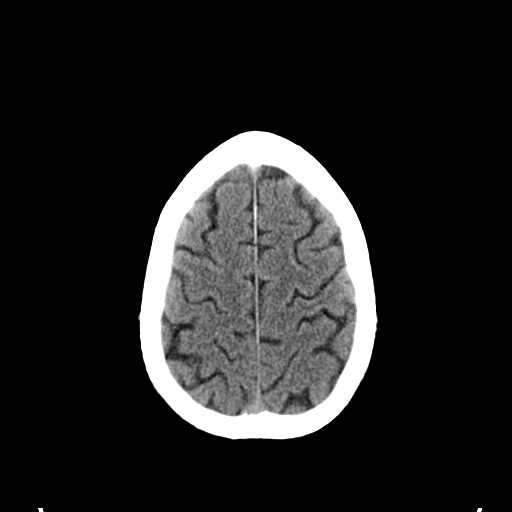
[im 27/30  brain]
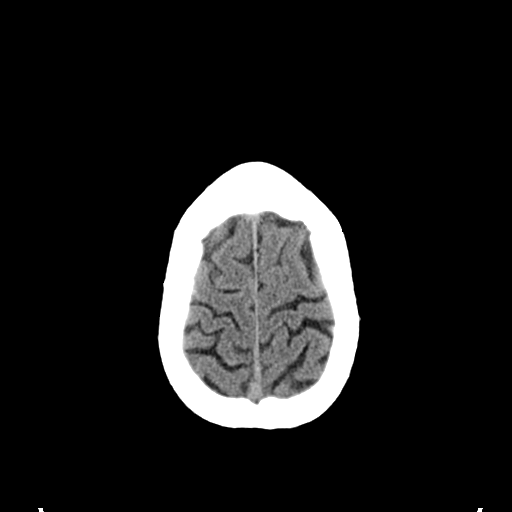
[im 29/30  brain]
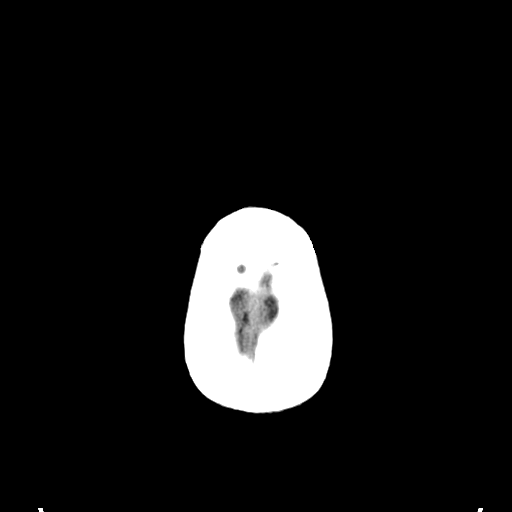

[16 of 30 positions shown; findings below may reference images not displayed]

FINDINGS: No intracranial hemorrhage. Slightly dense left middle
cerebral artery branch vessel within the sylvian fissure may
represent an acute thrombus.  Scattered small vessel disease type
changes suspected without CT evidence otherwise of large acute
infarct.  MR can be obtained for further delineation.  No
intracranial mass lesion detected on this unenhanced exam.
Advanced atherosclerotic type changes with calcification of the
vertebral arteries and carotid arteries.  Visualized sinuses and
mastoid air cells clear.
IMPRESSION: Question hyperdense left middle cerebral artery branch vessels
suggesting possibility of acute thrombus/infarct.

No intracranial hemorrhage.

Critical test results telephoned to [REDACTED] PA. at the time

## 2010-12-14 IMAGING — CT CT HEAD W/O CM
1 series · 16 of 30 positions shown, 20 images · non-contrast
Comparison: CTA head 08/17/2008, cerebral angiography 08/20/2008

CLINICAL DATA: Cerebrovascular accident

CT HEAD WITHOUT CONTRAST
TECHNIQUE: Contiguous axial images were obtained from the base of
the skull through the vertex without contrast.

[Series 2: head routine 4.8 h37s · axial · 0.47mm/px · z∈[-107,+53]mm · 16 of 36 slices shown, 20 images]
[im 2/36  brain]
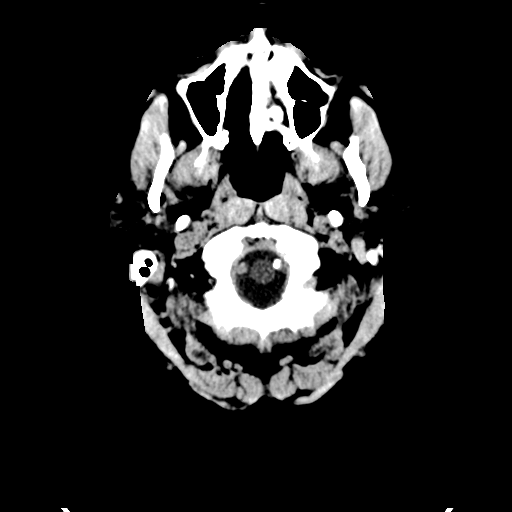
[im 2/36  bone]
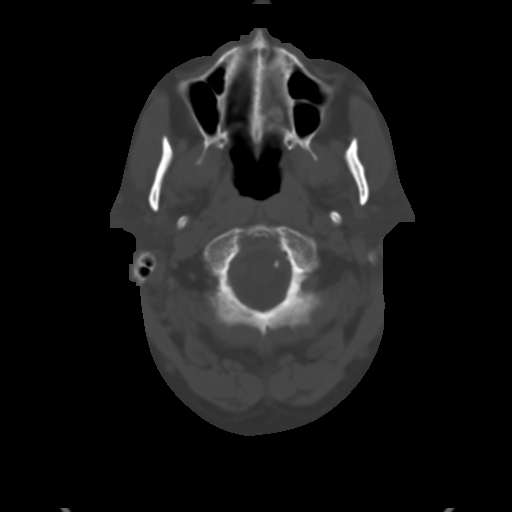
[im 4/36  brain]
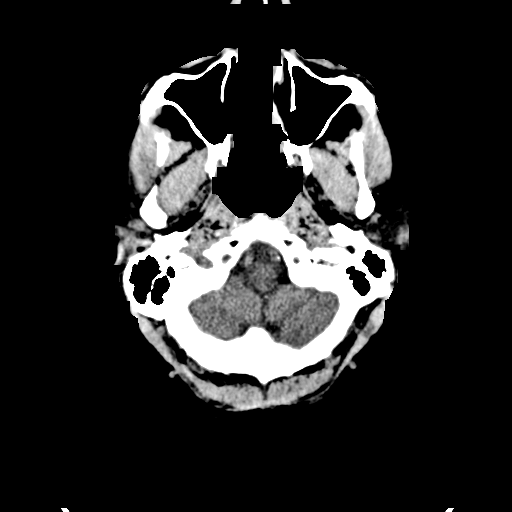
[im 7/36  brain]
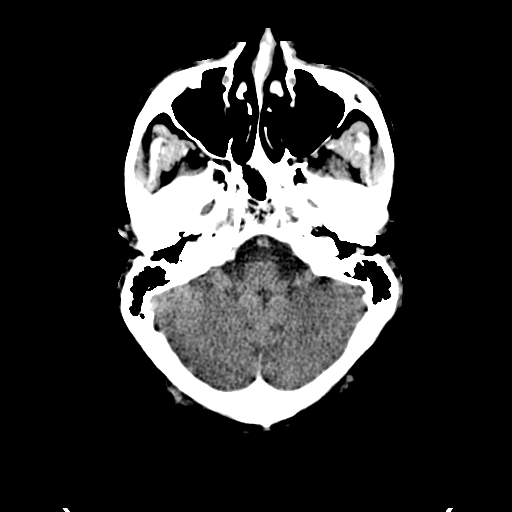
[im 9/36  brain]
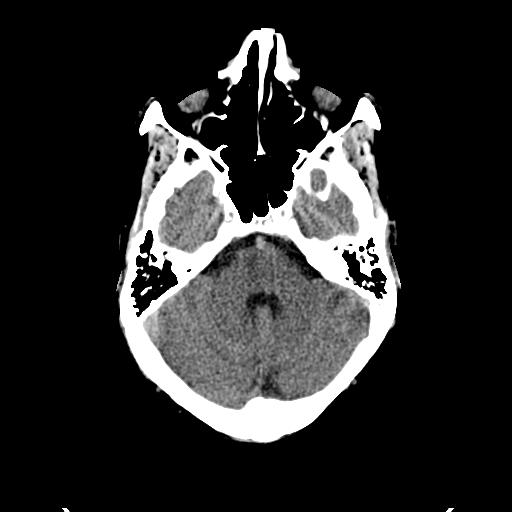
[im 10/36  brain]
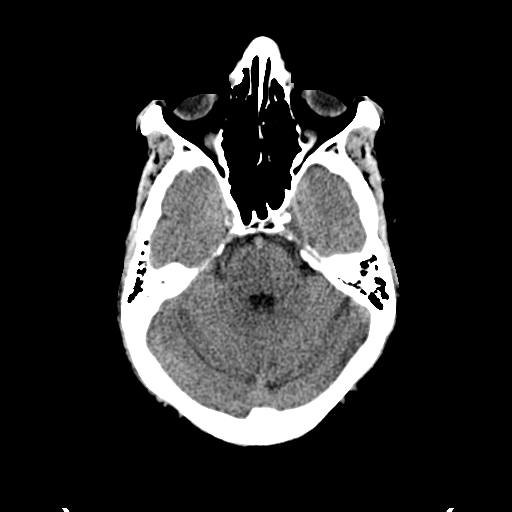
[im 10/36  bone]
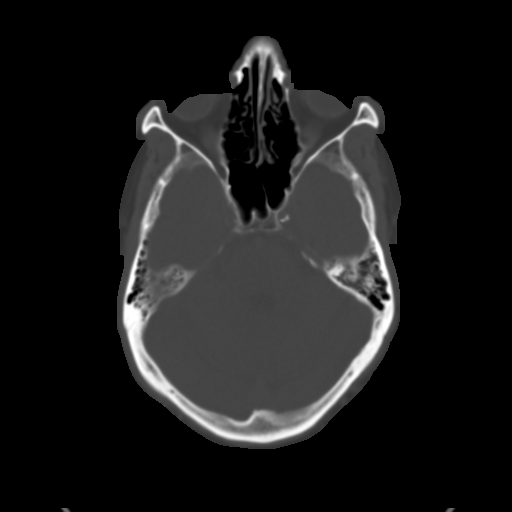
[im 13/36  brain]
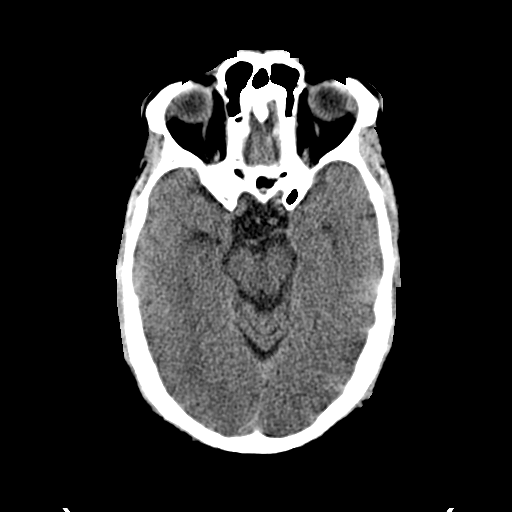
[im 15/36  brain]
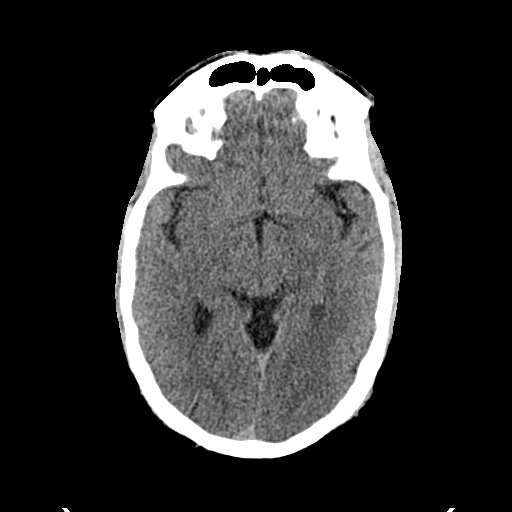
[im 17/36  brain]
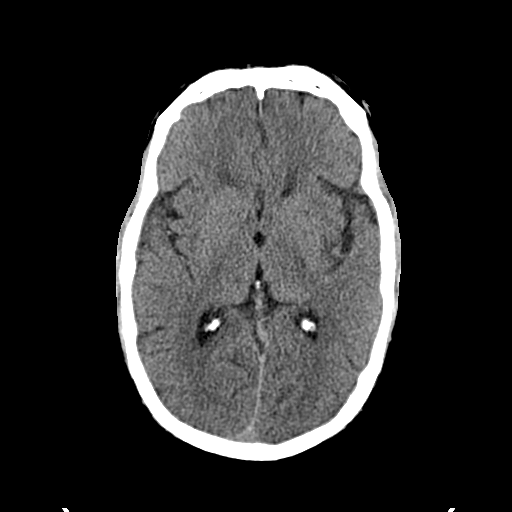
[im 19/36  brain]
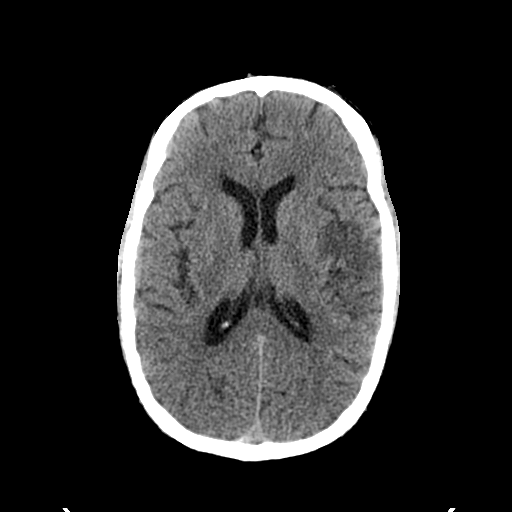
[im 19/36  bone]
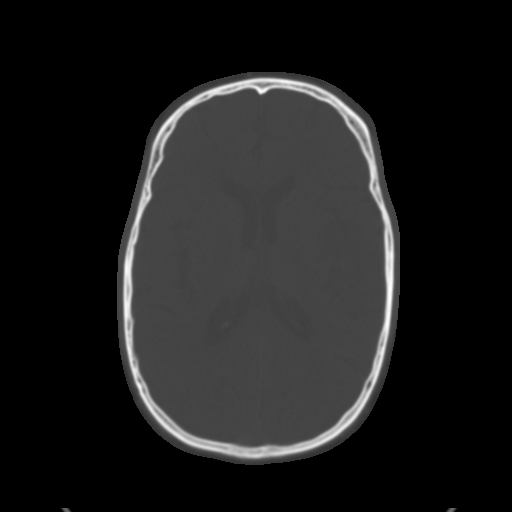
[im 21/36  brain]
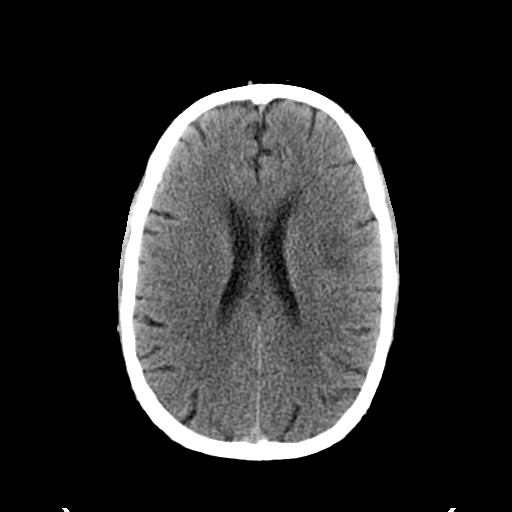
[im 23/36  brain]
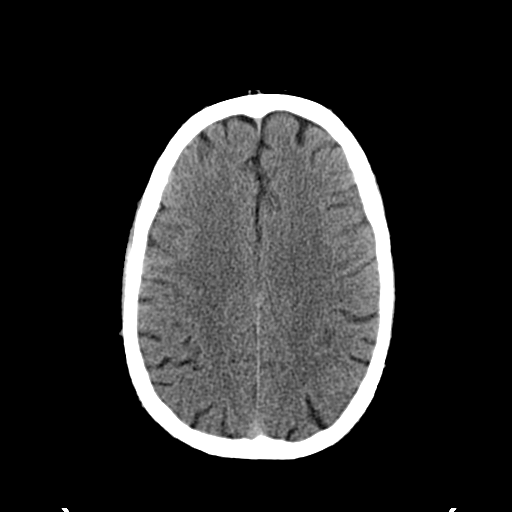
[im 26/36  brain]
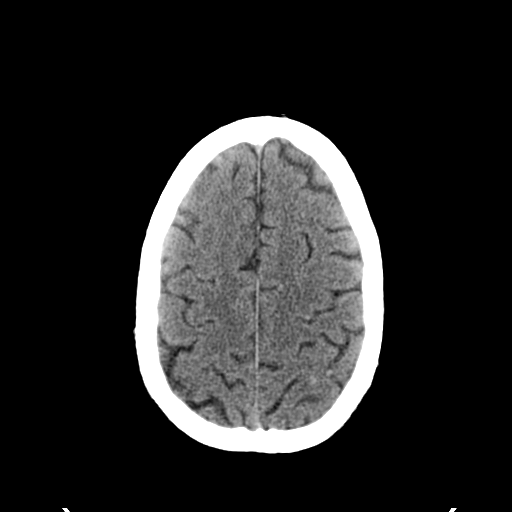
[im 27/36  brain]
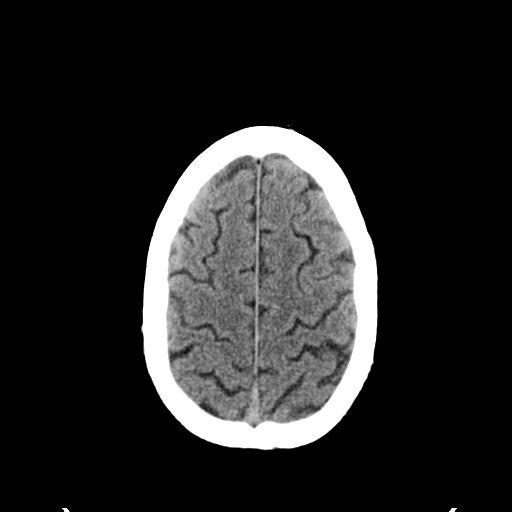
[im 27/36  bone]
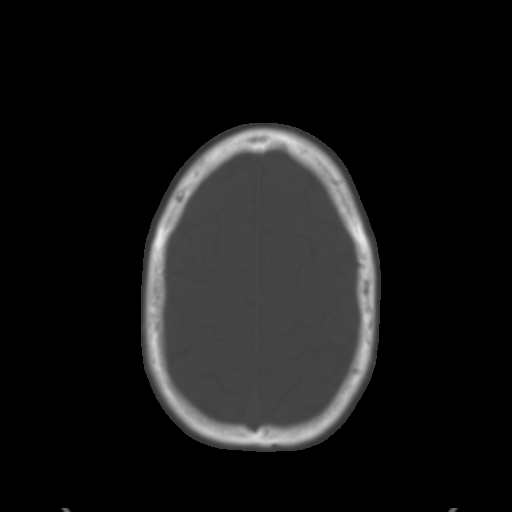
[im 29/36  brain]
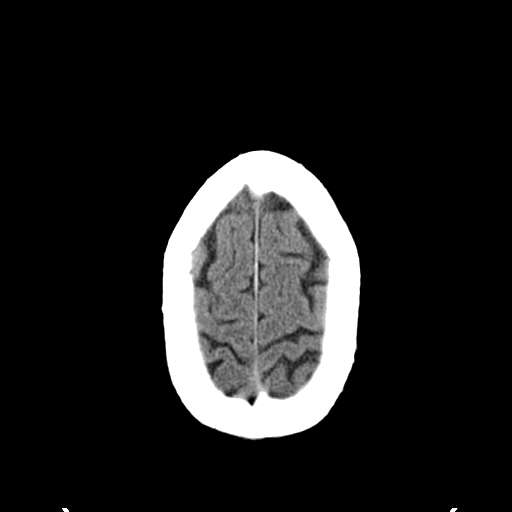
[im 32/36  brain]
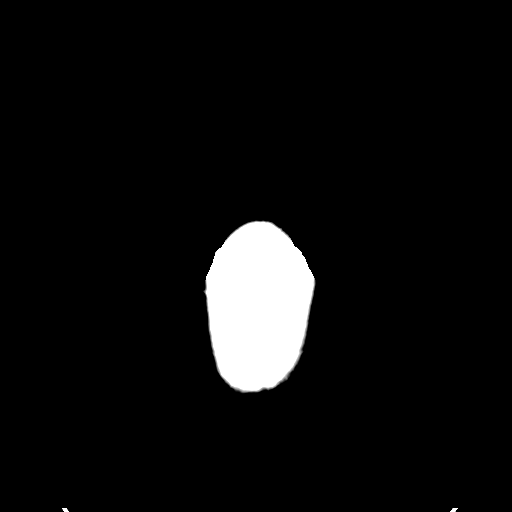
[im 34/36  brain]
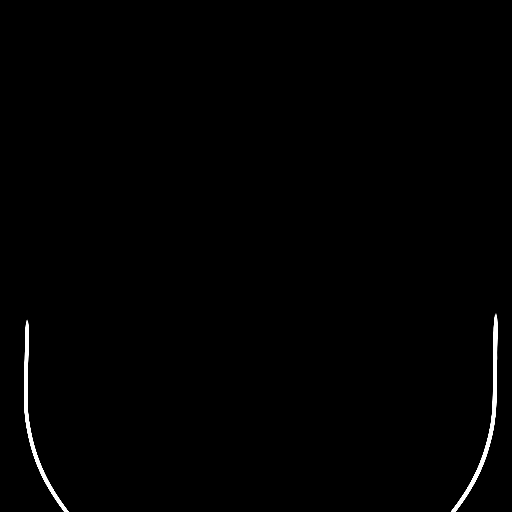

[16 of 30 positions shown; findings below may reference images not displayed]

FINDINGS: There is new hypodensity within the left temporal
operculum and insular ribbon consistent with acute / subacute
infarction.  There is new high density linear opacity in the
superior left parietal lobe (image 25).  There is no midline shift
or mass effect.  No evidence of hydrocephalus.  Basilar cisterns
are patent.

No calvarial lesion.
IMPRESSION: 1.  Acute / subacute infarction within the territory of the left
middle cerebral artery.
2.  Linear high density in the high left parietal lobe.
Differential includes thrombosis vessels, cortical necrosis  or
small amount of hemorrhage.

## 2010-12-14 IMAGING — XA IR ANGIO/CAROTID/CERV BI
1 series · 15 of 24 positions shown · non-contrast
Comparison: CT angiogram of 08/17/2008.

08/27/2008 - DUPLICATE COPY for exam association in [facility] – No change from original report.
CLINICAL DATA: Acute onset of aphasia and mild right upper
 extremity weakness.

 BILATERAL CAROTID ARTERIOGRAPHY AND BILATERAL VERTEBRAL ARTERY
 ANGIOGRAMS

[Series 1: run · 15 of 209 slices shown]
[im 1/209]
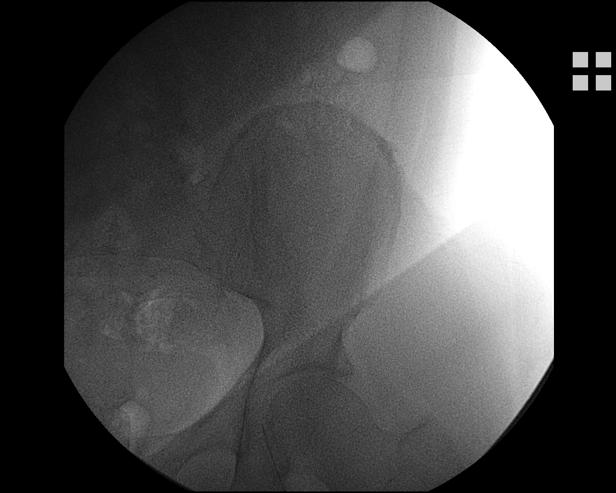
[im 19/209]
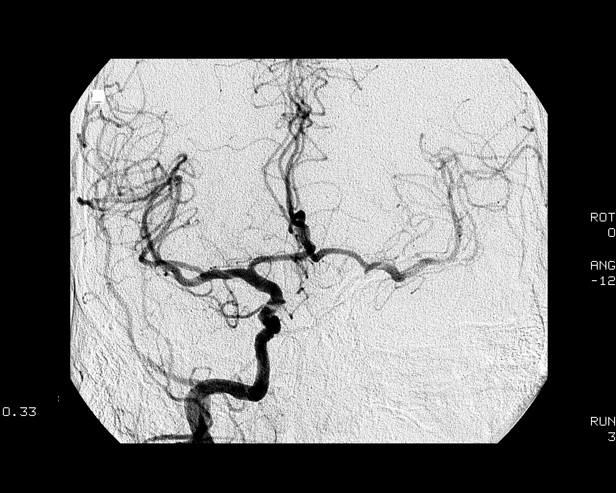
[im 37/209]
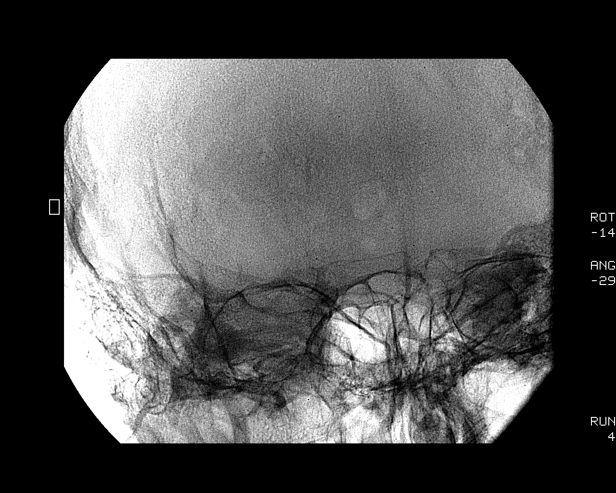
[im 46/209]
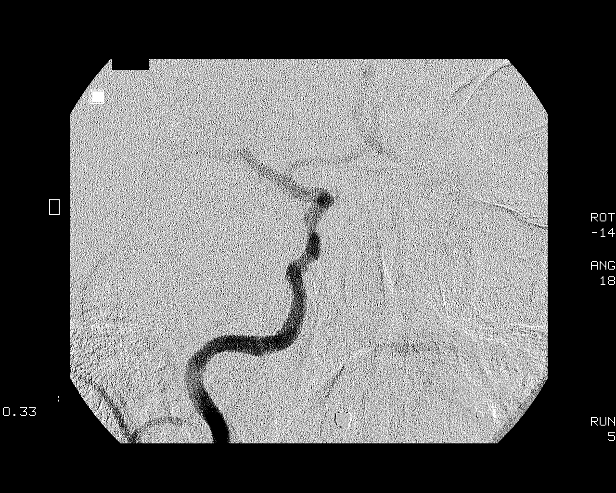
[im 64/209]
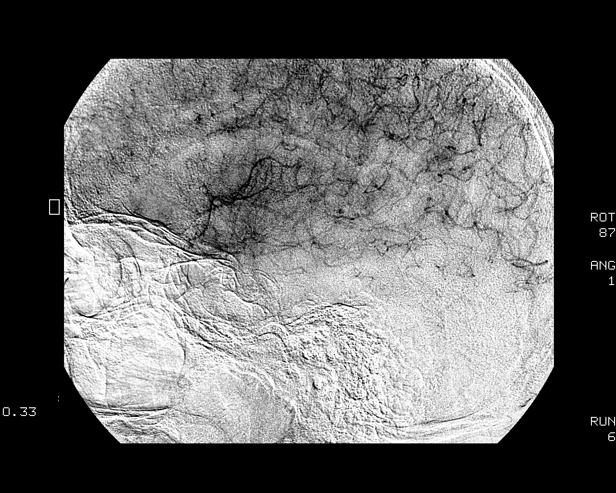
[im 73/209]
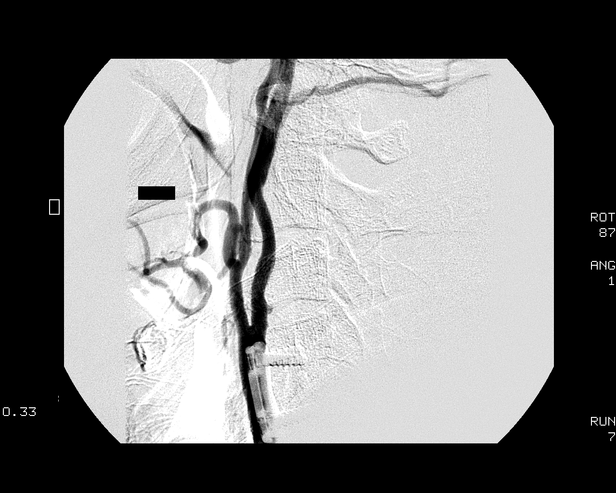
[im 91/209]
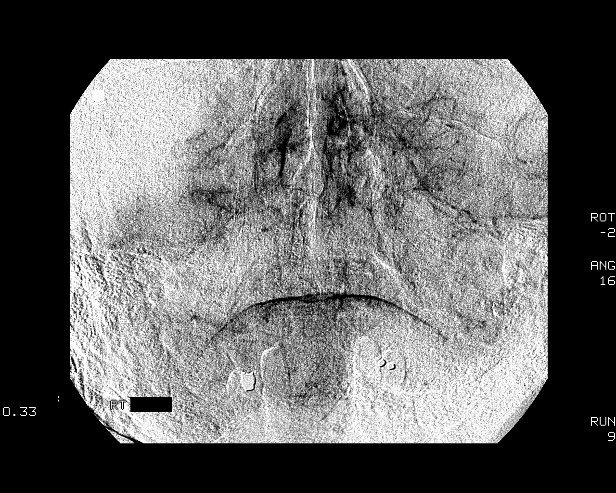
[im 109/209]
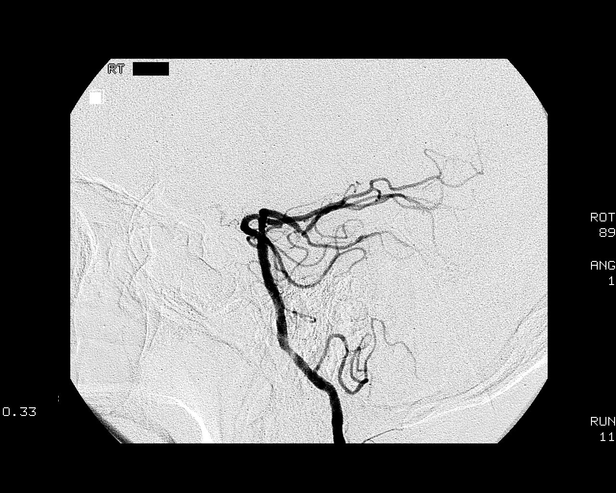
[im 118/209]
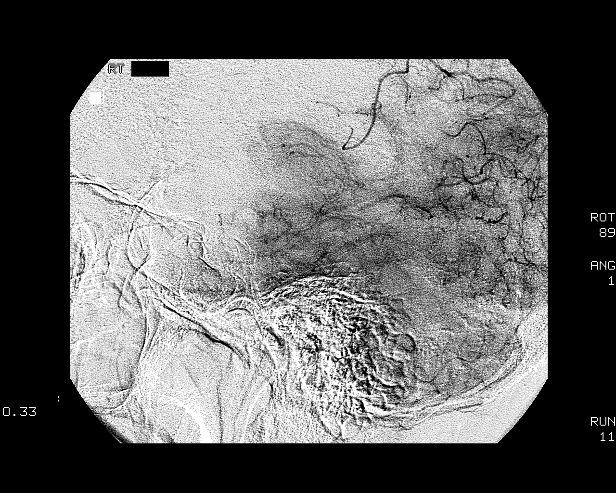
[im 136/209]
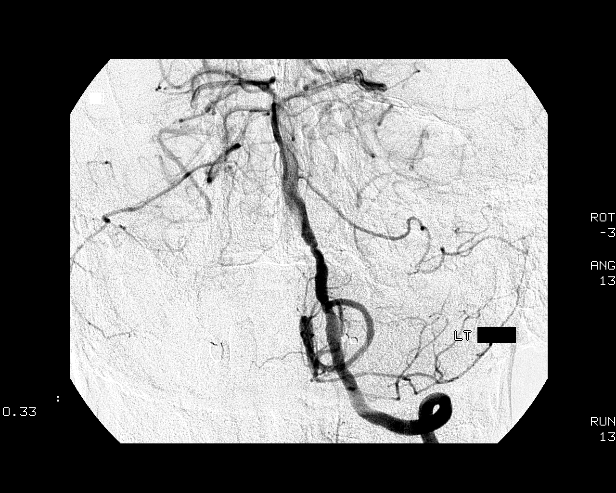
[im 145/209]
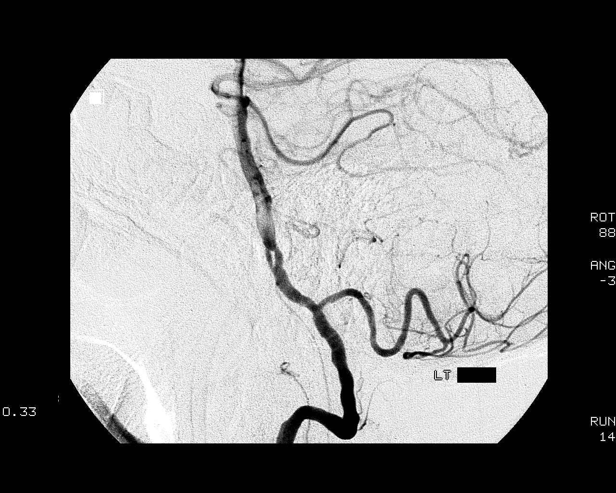
[im 163/209]
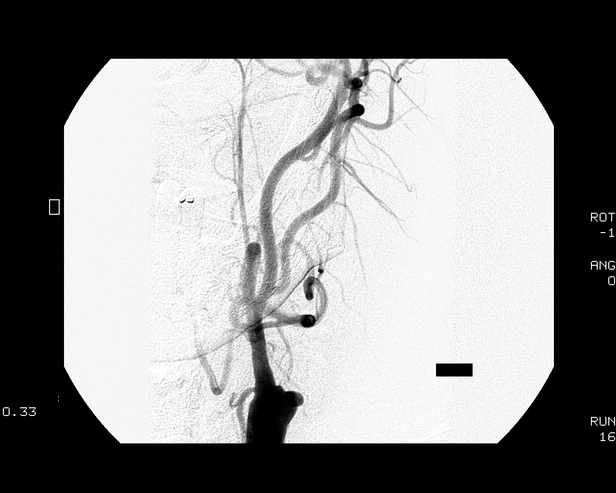
[im 181/209]
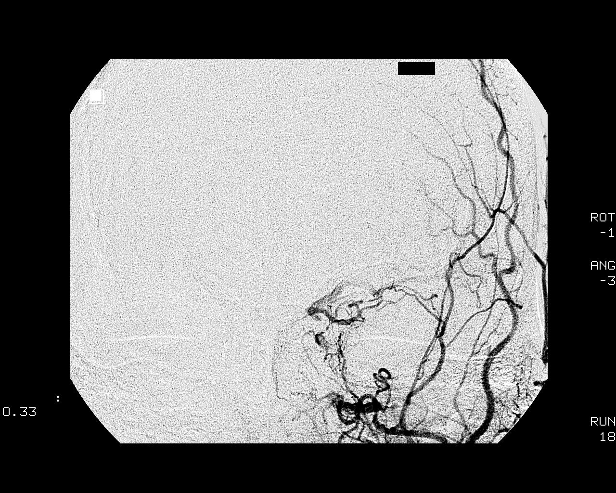
[im 190/209]
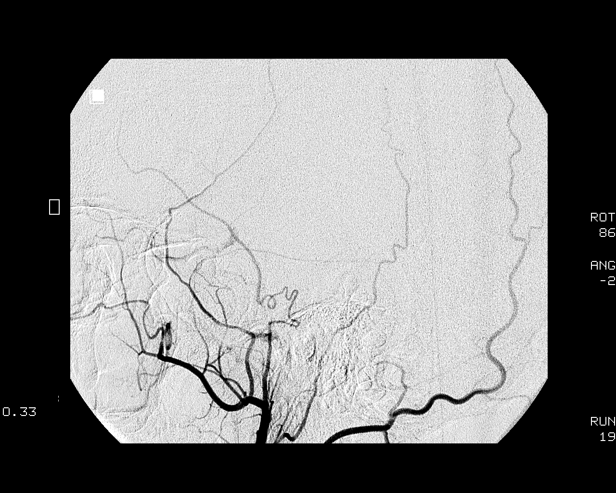
[im 209/209]
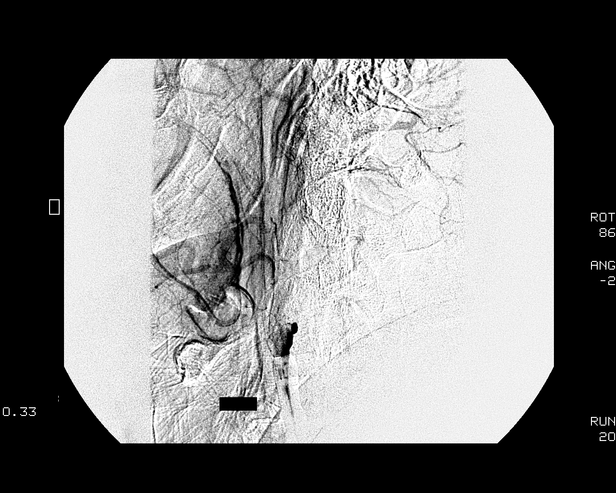

[15 of 24 positions shown; findings below may reference images not displayed]

Following a full explanation of the procedure along with the
 potential associated complications, an informed witnessed consent
 was obtained.

 The left groin was prepped and draped in the usual sterile fashion.
 Thereafter using modified Seldinger technique, transfemoral access
 into the left common femoral artery was obtained without
 difficulty. Over a 0.035 inch guidewire, a 5-French Pinnacle
 sheath was inserted. Through this and also over a 0.035 inch
 guidewire, a 5-French JB1 catheter was advanced to the aortic arch
 region and selectively positioned in the right common carotid
 artery, the right vertebral, the left common carotid artery and
 left vertebral artery.

 There were no acute complications. The patient tolerated procedure
 well.

 Medications utilized: Versed 1.5 mg IV. Fentanyl 37.5 mcg IV.

 Contrast: Kmnipaque-RQQ approximately 85 ml.
FINDINGS: The right common carotid arteriogram demonstrates the
 right external carotid artery and its major branch to be normally
 opacified.

 The right internal carotid at the bulb has a smooth, shallow plaque
 associated a small ulceration along the posterior wall. No
 stenosis is noted by the NASCET criteria.

 The vessel is otherwise seen to opacify normally to the cranial
 skull base.

 The petrous segment is normal.

 The petrous cavernous segment has a approximately 50% stenosis
 secondary to a smooth atherosclerotic plaque.

 Distal to this, the vessel is normal in the distal cavernous and
 the supraclinoid segments.

 The right middle and the right anterior cerebral arteries are seen
 to opacify normally into the capillary and venous phases. Prompt
 opacification via the anterior communicating artery of the left
 anterior cerebral artery and left middle cerebral artery is noted.

 The anterior communicating artery region is normally opacified.

 No filling defects are seen in the left MCA distribution.

 Inflow of unopacified blood is transiently in the left supraclinoid
 ICA.

 The right vertebral artery origin is normal. The vessel seen to
 opacify normally to the cranial skull base.

 Minimal atherosclerotic disease with narrowing is noted at the
 right vertebrobasilar junction. The right posterior-inferior
 cerebellar artery, however, is seen to opacify normally.

 The basilar artery, the posterior cerebral arteries, the superior
 cerebellar arteries and the anterior-inferior cerebellar arteries
 are seen to opacify normally into capillary and venous phases.
 Unopacified blood is seen in basilar artery from the contralateral
 vertebral artery.

 Attempted left meningeal collateralization of the left middle
 cerebral artery angular branch is seen from the left P3 branches.

 The left vertebral artery origin is normal. Vessel seen to opacify
 normally to the cranial skull base.

 Mild atherosclerotic disease of the left vertebrobasilar junction
 just to the left posterior-inferior cerebellar artery is seen. The
 left posterior-inferior cerebellar artery is seen to opacify
 normally as do the left vertebrobasilar junction just distally.
 Approximately 50% stenosis of the left vertebrobasilar junction is
 noted just proximal to the basilar artery, associated with a
 probable fenestration, a developmental variation. Distally the
 basilar artery, the posterior cerebral arteries, the superior
 cerebellar arteries and the anterior-inferior cerebellar arteries
 are seen to opacify normally into the capillary and the venous
 phases.

 The left common carotid arteriogram demonstrates the left external
 carotid artery and its major branches to be normally opacified.

 The left internal carotid artery at the bulb is completely occluded
 associated with a stump. No angiographic evidence of a string sign
 is seen.

 There is attempt at partial reconstitution of the left internal
 carotid artery in the petrous segment from the internal maxillary
 artery branches, and also in the cavernous segment from the left
 ophthalmic artery via the nasoseptal branches. Minimal amount of
 contrast is noted extending into the left middle cerebral artery to
 the trifurcation branches.
IMPRESSION: 1. Angiographically completely occluded left internal carotid at
 the bulb without angiographic string sign.
 2. Attempt at partial reconstitution of the left internal carotid
 artery in the petrous segment from the internal maxillary artery
 branches, and the cavernous segment from the ipsilateral ophthalmic
 artery.
 3. Opacification of the left anterior and the left middle cerebral
 artery via the anterior communicating artery from the right
 internal carotid artery injection.
 4. 50% stenosis of the left vertebrobasilar junction distal to the
 left posterior-inferior cerebellar artery.
 5. 50% stenosis of the right internal carotid artery in the
 proximal cavernous segment secondary to atherosclerotic plaque.

## 2010-12-20 ENCOUNTER — Encounter: Payer: Self-pay | Admitting: Internal Medicine

## 2010-12-22 ENCOUNTER — Ambulatory Visit: Payer: Medicaid Other | Admitting: Neurosurgery

## 2010-12-30 ENCOUNTER — Other Ambulatory Visit: Payer: Self-pay | Admitting: Internal Medicine

## 2011-01-22 ENCOUNTER — Other Ambulatory Visit: Payer: Self-pay | Admitting: *Deleted

## 2011-01-22 MED ORDER — SILDENAFIL CITRATE 100 MG PO TABS: 100.0000 mg | ORAL_TABLET | ORAL | Status: DC | PRN

## 2011-01-22 NOTE — Telephone Encounter (Signed)
Call when done @ [phone]

## 2011-01-23 NOTE — Telephone Encounter (Signed)
Pt called Rx ready

## 2011-01-29 ENCOUNTER — Encounter: Payer: Medicaid Other | Admitting: Internal Medicine

## 2011-02-16 ENCOUNTER — Other Ambulatory Visit (HOSPITAL_COMMUNITY): Payer: Self-pay | Admitting: Interventional Radiology

## 2011-02-16 ENCOUNTER — Other Ambulatory Visit: Payer: Self-pay | Admitting: *Deleted

## 2011-02-16 DIAGNOSIS — I771 Stricture of artery: Secondary | ICD-10-CM

## 2011-02-16 DIAGNOSIS — R200 Anesthesia of skin: Secondary | ICD-10-CM

## 2011-02-16 DIAGNOSIS — R413 Other amnesia: Secondary | ICD-10-CM

## 2011-02-16 DIAGNOSIS — R42 Dizziness and giddiness: Secondary | ICD-10-CM

## 2011-02-16 MED ORDER — CLOPIDOGREL BISULFATE 75 MG PO TABS: 75.0000 mg | ORAL_TABLET | Freq: Every day | ORAL | Status: DC

## 2011-02-16 NOTE — Telephone Encounter (Signed)
Pt is out of plavix, can you please refill?

## 2011-02-16 NOTE — Progress Notes (Signed)
Called in 10 mg valium for claustrophobia prior to MRI scan to the [facility] at [facility].    Michael Litter, PA-C

## 2011-02-16 NOTE — Telephone Encounter (Signed)
Pls sch appt with Dr Manson Passey. Was to F/U in Oct to see how Celexa was doing.DFr Manson Passey has no soon appts open - is there a wait list for cancellations?

## 2011-02-17 ENCOUNTER — Telehealth: Payer: Self-pay | Admitting: *Deleted

## 2011-02-17 NOTE — Telephone Encounter (Signed)
Pt calls stating he's having crazy thoughts and nightmares;thoughts of harming himself.  States he thinks it is from the Celexa, which was started Sept 2012.   States "bad thoughts" started about 1 month ago. And he had tried Xanax and Klonopin in the past. After talking to Dr. Fredrik Cove, pt needs to go to the ED to be evaluated. When instructed pt about going to the ED, he states he had stopped taking the Celexa 2 days ago and the "bad thoughts" went away. Dr. Aundria Rud made awared; an appt has been scheduled for Friday (pt uses Transportation and has to call 1-2 days ahead).

## 2011-02-17 NOTE — Telephone Encounter (Signed)
OK.  I know this patient.  He will probably need an on-going plan for his anxiety and/or depression, but this does not sound like an emergency.

## 2011-02-18 NOTE — Telephone Encounter (Signed)
Pt has scheduled appointment 2/8

## 2011-02-20 ENCOUNTER — Ambulatory Visit (INDEPENDENT_AMBULATORY_CARE_PROVIDER_SITE_OTHER): Payer: Medicaid Other | Admitting: Internal Medicine

## 2011-02-20 ENCOUNTER — Telehealth: Payer: Self-pay | Admitting: *Deleted

## 2011-02-20 VITALS — BP 131/77 | HR 69 | Temp 96.2°F | Ht 73.0 in | Wt 193.6 lb

## 2011-02-20 DIAGNOSIS — F32A Depression, unspecified: Secondary | ICD-10-CM

## 2011-02-20 DIAGNOSIS — F329 Major depressive disorder, single episode, unspecified: Secondary | ICD-10-CM

## 2011-02-20 DIAGNOSIS — F3289 Other specified depressive episodes: Secondary | ICD-10-CM

## 2011-02-20 DIAGNOSIS — I10 Essential (primary) hypertension: Secondary | ICD-10-CM

## 2011-02-20 DIAGNOSIS — G8929 Other chronic pain: Secondary | ICD-10-CM

## 2011-02-20 DIAGNOSIS — M79609 Pain in unspecified limb: Secondary | ICD-10-CM

## 2011-02-20 DIAGNOSIS — E119 Type 2 diabetes mellitus without complications: Secondary | ICD-10-CM

## 2011-02-20 MED ORDER — TRAMADOL HCL 50 MG PO TABS: 50.0000 mg | ORAL_TABLET | Freq: Four times a day (QID) | ORAL | Status: DC | PRN

## 2011-02-20 MED ORDER — MELOXICAM 7.5 MG PO TABS: 7.5000 mg | ORAL_TABLET | Freq: Two times a day (BID) | ORAL | Status: DC | PRN
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
## 2011-02-20 NOTE — Assessment & Plan Note (Signed)
BP Readings from Last 3 Encounters:  02/20/11 131/77  10/07/10 111/71  09/25/10 133/81    Basic Metabolic Panel:    Component Value Date/Time   NA 135 04/02/2010 1135   K 4.5 04/02/2010 1135   CL 104 04/02/2010 1135   CO2 24 04/02/2010 1135   BUN 20 04/02/2010 1135   CREATININE 1.08 04/02/2010 1135   GLUCOSE 144* 04/02/2010 1135   CALCIUM 9.4 04/02/2010 1135   CALCIUM 9.1 10/28/2009 2118    Assessment: Disease Control: controlled  Progress toward goals: at goal  Barriers to meeting goals: no barriers identified   Plan:  continue current medications

## 2011-02-20 NOTE — Patient Instructions (Signed)
   Please follow-up at the clinic in 1 month, at which time we will reevaluate your pain, diabetes, blood pressure.  There have been changes in your medications.  START meloxicam for the pain - DO NOT take more than 2 pills daily  START tramadol for pain - DO NOT take more than prescribed  We will try to get you in to a new pain clinic - we will follow-up with you.  Please go to [facility] for continued help with your depression and anxiety.  Please follow-up in the clinic sooner if needed.  If you have been started on new medication(s), and you develop symptoms concerning for allergic reaction, including, but not limited to, throat closing, tongue swelling, rash, please stop the medication immediately and call the clinic at [phone], and go to the ER.  If you are diabetic, please bring your meter to your next visit.  If symptoms worsen, or new symptoms arise, please call the clinic or go to the ER.  Please bring all of your medications in a bag to your next visit.

## 2011-02-20 NOTE — Assessment & Plan Note (Signed)
Patient has had chronic left foot pain since his Left leg surgery in May 2011 - concerning for complex regional pain syndrome. He has had ABI's performed by his vascular surgeon, which show mild reduction of blood flow in his LLE, but not severe enough to warrant surgery. He states he last followed with Dr. Hermelinda Medicus, pain management clinic, 1 month ago. I called the pain management clinic who stated patient dismissed in November 2012 - could not give me more details. However, pt indicates he essentially had clean urine, when he was supposed to be on Opana and Oxycodone. States at this point he is requiring to buy pills off of the street to allow for pain control. He understands that he has violated our pain contract and cannot be prescribed opiates from our clinic.  Start meloxicam and tramadol  Refer to a different pain clinic (I think because of the complexity of his pain, requirement for extensive narcotics)  Requested last clinic note from Dr. Hermelinda Medicus - pending.

## 2011-02-20 NOTE — Assessment & Plan Note (Addendum)
Pt is currently not on medication - was recently started on celexa - but reports suicidal ideation with this medication, therefore, he stopped this medication on his own.  States he previously was on ativan and klonopin, and they were more effective for him. Other contributing factors towards his symptoms include severe pain. Is not suicidal or homicidal at this time. Given complexity of his symptoms and that he has severe side effects of celexa, psychiatry follow-up is warranted for adjustment of medications.  Plan:  STOP celexa  Strongly recommended follow-up with [facility], and patient is explain reason that psychiatric professional will be most helpful with adjustment of his medications - he agrees.   Instructed to present immediately for evaluation if he has SI/HI.

## 2011-02-24 ENCOUNTER — Telehealth (HOSPITAL_COMMUNITY): Payer: Self-pay

## 2011-02-25 ENCOUNTER — Inpatient Hospital Stay (HOSPITAL_COMMUNITY)
Admission: RE | Admit: 2011-02-25 | Discharge: 2011-02-25 | Payer: Medicaid Other | Source: Ambulatory Visit | Attending: Interventional Radiology | Admitting: Interventional Radiology

## 2011-02-25 ENCOUNTER — Other Ambulatory Visit (HOSPITAL_COMMUNITY): Payer: Medicaid Other

## 2011-03-03 ENCOUNTER — Telehealth: Payer: Self-pay | Admitting: *Deleted

## 2011-03-03 NOTE — Telephone Encounter (Signed)
Patient called in to report severe pain in his feet, toes, and wrist. He states that he has been discharged from both his orthopedist and pain clinic due to UDS issues. 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 He has appts to come in for repeat vascular studies on 04-07-11 riight now. I told him that we would move these up in order to reevaluate his BPG and blood flow. He is agreeable to this and states that he is in the process of getting into a new pain clinic for his medications.

## 2011-03-05 ENCOUNTER — Other Ambulatory Visit (INDEPENDENT_AMBULATORY_CARE_PROVIDER_SITE_OTHER): Payer: Medicaid Other | Admitting: *Deleted

## 2011-03-05 ENCOUNTER — Encounter (INDEPENDENT_AMBULATORY_CARE_PROVIDER_SITE_OTHER): Payer: Medicaid Other | Admitting: *Deleted

## 2011-03-05 ENCOUNTER — Ambulatory Visit (INDEPENDENT_AMBULATORY_CARE_PROVIDER_SITE_OTHER): Payer: Medicaid Other | Admitting: Vascular Surgery

## 2011-03-05 DIAGNOSIS — I6523 Occlusion and stenosis of bilateral carotid arteries: Secondary | ICD-10-CM

## 2011-03-05 DIAGNOSIS — I739 Peripheral vascular disease, unspecified: Secondary | ICD-10-CM

## 2011-03-05 DIAGNOSIS — I70219 Atherosclerosis of native arteries of extremities with intermittent claudication, unspecified extremity: Secondary | ICD-10-CM

## 2011-03-05 DIAGNOSIS — I771 Stricture of artery: Secondary | ICD-10-CM

## 2011-03-06 ENCOUNTER — Encounter: Payer: Self-pay | Admitting: Vascular Surgery
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
## 2011-03-09 ENCOUNTER — Telehealth: Payer: Self-pay | Admitting: *Deleted

## 2011-03-09 NOTE — Telephone Encounter (Signed)
Pt called left message that he had fallen, having problem w/ leg that has "a rod" in it, attempted to call him back, unable to reach pt, no ability to leave message

## 2011-03-20 ENCOUNTER — Encounter: Payer: Medicaid Other | Admitting: Internal Medicine

## 2011-03-24 ENCOUNTER — Other Ambulatory Visit: Payer: Self-pay | Admitting: *Deleted

## 2011-03-24 DIAGNOSIS — I739 Peripheral vascular disease, unspecified: Secondary | ICD-10-CM

## 2011-03-31 NOTE — Telephone Encounter (Signed)
Contacts         Type  Contact  Phone    02/24/2011 3:13 PM  Phone (Outgoing)  [name], [name] (Self)  [phone] (M)    Left Message- Lft message stating that [name] is going to continue to try tomorrow while at [facility]. I have canceled the appts but left the orders out there in case we get the AUTH I can reschedule him for a later date.

## 2011-04-03 ENCOUNTER — Encounter: Payer: Self-pay | Admitting: Vascular Surgery

## 2011-04-03 ENCOUNTER — Other Ambulatory Visit (HOSPITAL_COMMUNITY): Payer: Self-pay | Admitting: Interventional Radiology

## 2011-04-03 ENCOUNTER — Other Ambulatory Visit: Payer: Self-pay | Admitting: *Deleted

## 2011-04-03 DIAGNOSIS — Z48812 Encounter for surgical aftercare following surgery on the circulatory system: Secondary | ICD-10-CM

## 2011-04-03 DIAGNOSIS — I771 Stricture of artery: Secondary | ICD-10-CM

## 2011-04-03 DIAGNOSIS — I739 Peripheral vascular disease, unspecified: Secondary | ICD-10-CM

## 2011-04-03 NOTE — Procedures (Unsigned)
BYPASS GRAFT EVALUATION  INDICATION:  Right lower extremity bypass graft.  HISTORY: Diabetes:  Yes Cardiac:  No Hypertension:  Yes Smoking:  Previous. Previous Surgery:  Right fem-pop bypass graft in 1999 with popliteal segment intervention in July 2010.  SINGLE LEVEL ARTERIAL EXAM                              RIGHT              LEFT Brachial: Anterior tibial: Posterior tibial: Peroneal: Ankle/brachial index:  PREVIOUS ABI:  Date:  RIGHT:  LEFT:  LOWER EXTREMITY BYPASS GRAFT DUPLEX EXAM:  DUPLEX:  Monophasic Doppler waveforms noted throughout the right lower extremity bypass graft with no increase in velocities noted.  The right proximal to mid bypass graft demonstrates irregular contours and mild tortuosity with mild nonocclusive areas of homogenous plaque noted in the periphery of some of the irregularities.  IMPRESSION:  Patent right fem-pop bypass graft with no increase in velocities.  Irregular appearance of the proximal graft levels noted, as described above.  Bilateral ankle brachial indices are noted on separate report.  ___________________________________________ Larina Earthly, M.D.  CH/MEDQ  D:  03/06/2011  T:  03/06/2011  Job:  [id number]

## 2011-04-03 NOTE — Procedures (Unsigned)
CAROTID DUPLEX EXAM  INDICATION:  Carotid disease  HISTORY: Diabetes:  Yes Cardiac:  No Hypertension:  Yes Smoking:  Previous. Previous Surgery:  No CV History:  Occasional dizziness Amaurosis Fugax No, Paresthesias No, Hemiparesis No                                      RIGHT             LEFT Brachial systolic pressure:         115               114 Brachial Doppler waveforms:         Normal            Normal Vertebral direction of flow:        Antegrade         Antegrade DUPLEX VELOCITIES (cm/sec) CCA peak systolic                   97                91 ECA peak systolic                   143               176 ICA peak systolic                   102               Occluded ICA end diastolic PLAQUE MORPHOLOGY:                  Heterogenous      Heterogenous PLAQUE AMOUNT:                      Mild              Occlusive PLAQUE LOCATION:                    ICA               ICA/CCA  IMPRESSION:  No hemodynamically significant stenosis of the right internal carotid artery with plaque formations as described above.  Known occlusion of the left internal carotid artery based on this exam and carotid angiogram on 04/18/2010.  ___________________________________________ Larina Earthly, M.D.  CH/MEDQ  D:  03/06/2011  T:  03/06/2011  Job:  [id number]

## 2011-04-06 ENCOUNTER — Other Ambulatory Visit: Payer: Self-pay | Admitting: Radiology

## 2011-04-06 ENCOUNTER — Encounter (HOSPITAL_COMMUNITY): Payer: Self-pay | Admitting: Pharmacy Technician

## 2011-04-07 ENCOUNTER — Other Ambulatory Visit: Payer: Medicaid Other

## 2011-04-08 ENCOUNTER — Telehealth: Payer: Self-pay | Admitting: Internal Medicine

## 2011-04-08 ENCOUNTER — Other Ambulatory Visit (HOSPITAL_COMMUNITY): Payer: Self-pay | Admitting: Physician Assistant

## 2011-04-08 ENCOUNTER — Other Ambulatory Visit: Payer: Self-pay | Admitting: Radiology

## 2011-04-09 ENCOUNTER — Other Ambulatory Visit (HOSPITAL_COMMUNITY): Payer: Self-pay | Admitting: Interventional Radiology

## 2011-04-09 ENCOUNTER — Ambulatory Visit (HOSPITAL_COMMUNITY)
Admission: RE | Admit: 2011-04-09 | Discharge: 2011-04-09 | Disposition: A | Discharge status: Home / Self Care | Payer: Medicaid Other | Source: Ambulatory Visit | Attending: Interventional Radiology | Admitting: Interventional Radiology

## 2011-04-09 DIAGNOSIS — E119 Type 2 diabetes mellitus without complications: Secondary | ICD-10-CM | POA: Insufficient documentation

## 2011-04-09 DIAGNOSIS — E785 Hyperlipidemia, unspecified: Secondary | ICD-10-CM | POA: Insufficient documentation

## 2011-04-09 DIAGNOSIS — I6509 Occlusion and stenosis of unspecified vertebral artery: Secondary | ICD-10-CM | POA: Insufficient documentation

## 2011-04-09 DIAGNOSIS — I771 Stricture of artery: Secondary | ICD-10-CM

## 2011-04-09 DIAGNOSIS — Z8673 Personal history of transient ischemic attack (TIA), and cerebral infarction without residual deficits: Secondary | ICD-10-CM | POA: Insufficient documentation

## 2011-04-09 DIAGNOSIS — I10 Essential (primary) hypertension: Secondary | ICD-10-CM | POA: Insufficient documentation

## 2011-04-09 DIAGNOSIS — I739 Peripheral vascular disease, unspecified: Secondary | ICD-10-CM | POA: Insufficient documentation

## 2011-04-09 DIAGNOSIS — B1920 Unspecified viral hepatitis C without hepatic coma: Secondary | ICD-10-CM | POA: Insufficient documentation

## 2011-04-09 DIAGNOSIS — F17200 Nicotine dependence, unspecified, uncomplicated: Secondary | ICD-10-CM | POA: Insufficient documentation

## 2011-04-09 DIAGNOSIS — I6529 Occlusion and stenosis of unspecified carotid artery: Secondary | ICD-10-CM | POA: Insufficient documentation

## 2011-04-09 LAB — CBC
MCH: 31.5 pg (ref 26.0–34.0)
MCHC: 33.8 g/dL (ref 30.0–36.0)
MCV: 93.3 fL (ref 78.0–100.0)
Platelets: 221 10*3/uL (ref 150–400)
RBC: 5.04 MIL/uL (ref 4.22–5.81)

## 2011-04-09 LAB — GLUCOSE, CAPILLARY: Glucose-Capillary: 163 mg/dL — ABNORMAL HIGH (ref 70–99)

## 2011-04-09 LAB — BASIC METABOLIC PANEL
BUN: 16 mg/dL (ref 6–23)
CO2: 27 mEq/L (ref 19–32)
Calcium: 9.4 mg/dL (ref 8.4–10.5)
Creatinine, Ser: 0.9 mg/dL (ref 0.50–1.35)
GFR calc non Af Amer: >90 mL/min (ref >90)
Glucose, Bld: 124 mg/dL — ABNORMAL HIGH (ref 70–99)
Sodium: 137 mEq/L (ref 135–145)

## 2011-04-09 LAB — DIFFERENTIAL
Basophils Relative: 1 % (ref 0–1)
Eosinophils Absolute: 0.6 10*3/uL (ref 0.0–0.7)
Eosinophils Relative: 8 % — ABNORMAL HIGH (ref 0–5)
Lymphs Abs: 3 10*3/uL (ref 0.7–4.0)
Monocytes Relative: 8 % (ref 3–12)

## 2011-04-09 LAB — PROTIME-INR: Prothrombin Time: 13.4 seconds (ref 11.6–15.2)

## 2011-04-09 MED ORDER — FENTANYL CITRATE 0.05 MG/ML IJ SOLN
INTRAMUSCULAR | Status: AC | PRN
  Administered 2011-04-09: 25 ug via INTRAVENOUS

## 2011-04-09 MED ORDER — IOHEXOL 300 MG/ML  SOLN: 150.0000 mL | Freq: Once | INTRAMUSCULAR | Status: AC | PRN

## 2011-04-09 MED ORDER — HEPARIN SODIUM (PORCINE) 1000 UNIT/ML IJ SOLN
INTRAMUSCULAR | Status: AC | PRN
  Administered 2011-04-09: 500 [IU] via INTRAVENOUS

## 2011-04-09 MED ORDER — INSULIN ASPART 100 UNIT/ML ~~LOC~~ SOLN
0.0000 [IU] | Freq: Every day | SUBCUTANEOUS | Status: DC
  Filled 2011-04-09: qty 3

## 2011-04-09 MED ORDER — SODIUM CHLORIDE 0.9 % IV SOLN: INTRAVENOUS | Status: AC

## 2011-04-09 MED ORDER — MIDAZOLAM HCL 2 MG/2ML IJ SOLN
INTRAMUSCULAR | Status: AC
  Filled 2011-04-09: qty 4

## 2011-04-09 MED ORDER — FENTANYL CITRATE 0.05 MG/ML IJ SOLN
INTRAMUSCULAR | Status: AC
  Filled 2011-04-09: qty 4

## 2011-04-09 MED ORDER — INSULIN ASPART 100 UNIT/ML ~~LOC~~ SOLN
0.0000 [IU] | Freq: Three times a day (TID) | SUBCUTANEOUS | Status: DC
  Administered 2011-04-09: 3 [IU] via SUBCUTANEOUS
  Filled 2011-04-09: qty 3

## 2011-04-09 MED ORDER — HYDRALAZINE HCL 20 MG/ML IJ SOLN
INTRAMUSCULAR | Status: AC
  Filled 2011-04-09: qty 1

## 2011-04-09 MED ORDER — FAMOTIDINE IN NACL 20-0.9 MG/50ML-% IV SOLN
20.0000 mg | Freq: Once | INTRAVENOUS | Status: DC
  Filled 2011-04-09: qty 50

## 2011-04-09 MED ORDER — MIDAZOLAM HCL 5 MG/5ML IJ SOLN
INTRAMUSCULAR | Status: AC | PRN
  Administered 2011-04-09: 1 mg via INTRAVENOUS

## 2011-04-09 MED ORDER — METHYLPREDNISOLONE SODIUM SUCC 125 MG IJ SOLR
125.0000 mg | Freq: Once | INTRAMUSCULAR | Status: AC
  Administered 2011-04-09: 125 mg via INTRAVENOUS
  Filled 2011-04-09: qty 2

## 2011-04-09 MED ORDER — SODIUM CHLORIDE 0.9 % IV SOLN
INTRAVENOUS | Status: DC
  Administered 2011-04-09: 09:00:00 via INTRAVENOUS
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
## 2011-04-09 NOTE — ED Notes (Signed)
CBG checked - 151

## 2011-04-09 NOTE — Telephone Encounter (Signed)
Ref/ Note completed

## 2011-04-09 NOTE — Discharge Instructions (Signed)
1. RESUME METFORMIN ON Saturday THE 30 thGroin Site Care Refer to this sheet in the next few weeks. These instructions provide you with information on caring for yourself after your procedure. Your caregiver may also give you more specific instructions. Your treatment has been planned according to current medical practices, but problems sometimes occur. Call your caregiver if you have any problems or questions after your procedure. HOME CARE INSTRUCTIONS  You may shower 24 hours after the procedure. Remove the bandage (dressing) and gently wash the site with plain soap and water. Gently pat the site dry.   Do not apply powder or lotion to the site.   Do not sit in a bathtub, swimming pool, or whirlpool for 5 to 7 days.   No bending, squatting, or lifting anything over 10 pounds (4.5 kg) as directed by your caregiver.   Inspect the site at least twice daily.   Do not drive home if you are discharged the same day of the procedure. Have someone else drive you.   You may drive 24 hours after the procedure unless otherwise instructed by your caregiver.  What to expect:  Any bruising will usually fade within 1 to 2 weeks.   Blood that collects in the tissue (hematoma) may be painful to the touch. It should usually decrease in size and tenderness within 1 to 2 weeks.  SEEK IMMEDIATE MEDICAL CARE IF:  You have unusual pain at the groin site or down the affected leg.   You have redness, warmth, swelling, or pain at the groin site.   You have drainage (other than a small amount of blood on the dressing).   You have chills.   You have a fever or persistent symptoms for more than 72 hours.   You have a fever and your symptoms suddenly get worse.   Your leg becomes pale, cool, tingly, or numb.   You have heavy bleeding from the site. Hold pressure on the site.  Document Released: 01/31/2010 Document Revised: 12/18/2010 Document Reviewed: 01/31/2010 [facility] Patient Information 2012  [place], [place].

## 2011-04-09 NOTE — Progress Notes (Signed)
Pt states he is taking the bus home and will be home alone.  I instructed pt that he had to have an adult stay with him for 24hr and that he was not advised to take the bus.  Pt states that his Drs told him it was okay and he was leaving.  Michael Litter was notified of above and gave pt 3 choices. 1- stay overnight for observation 2- get someone to take him home and stay with him for 24hrs. Or 3-leave AMA. Pt did make a few calls and then stated "nobody can come get me and Im not staying here.  Give me the forms to sign.  I'm going home"  The risks of leaving with an arterial stick were explained to pt.  Pt signed the AMA forms and walked out.

## 2011-04-09 NOTE — Procedures (Signed)
S/P bilateral vert angiograms and  RT CCA angiogram. Preliminary findings  1.Approx 70 % stenosis of rt ICA cav seg 2. Appro 60 % stenosis RT Vert A origin

## 2011-04-10 MED FILL — Insulin Aspart Inj 100 Unit/ML: SUBCUTANEOUS | Qty: 0.03 | Status: AC

## 2011-04-12 ENCOUNTER — Telehealth (HOSPITAL_COMMUNITY): Payer: Self-pay | Admitting: Radiology

## 2011-04-13 ENCOUNTER — Telehealth (HOSPITAL_COMMUNITY): Payer: Self-pay

## 2011-04-17 ENCOUNTER — Other Ambulatory Visit: Payer: Self-pay | Admitting: Internal Medicine

## 2011-04-21 ENCOUNTER — Telehealth (HOSPITAL_COMMUNITY): Payer: Self-pay

## 2011-04-21 NOTE — Telephone Encounter (Signed)
Spoke w/ pt to remind him of his appt Thursday 04-23-11 He will get back in touch with me w/in the hour to let me know if his family can accompany him to his appt.

## 2011-04-23 ENCOUNTER — Ambulatory Visit (HOSPITAL_COMMUNITY)
Admission: RE | Admit: 2011-04-23 | Discharge: 2011-04-23 | Disposition: A | Discharge status: Home / Self Care | Payer: Medicaid Other | Source: Ambulatory Visit | Attending: Interventional Radiology | Admitting: Interventional Radiology

## 2011-04-23 DIAGNOSIS — I771 Stricture of artery: Secondary | ICD-10-CM

## 2011-04-27 ENCOUNTER — Telehealth: Payer: Self-pay | Admitting: *Deleted

## 2011-04-27 ENCOUNTER — Telehealth (HOSPITAL_COMMUNITY): Payer: Self-pay | Admitting: *Deleted

## 2011-04-27 NOTE — Telephone Encounter (Signed)
Spoke with [name] in regards to his message he left.  He wanted to know why Dr. Corliss Skains did not recommend stenting.  Dr. Corliss Skains stated that because the plavix is maintaining the pt's symptoms at this time pt is to remain on plavix.

## 2011-04-27 NOTE — Telephone Encounter (Signed)
Wants 2nd opinion on recent angiogram - appt made Dr Scot Dock 04/30/11 3:15PM Macy Lingenfelter RN 04/27/11 11:45AM

## 2011-04-29 ENCOUNTER — Telehealth: Payer: Self-pay | Admitting: *Deleted

## 2011-04-29 NOTE — Telephone Encounter (Signed)
Prior Authorization for Fenofibrate 48 mg approved 04/29/2011 thru 04/28/2012.   [facility] was called and notified of.  Angelina Ok, RN 04/29/2011 10:05 AM.

## 2011-04-30 ENCOUNTER — Encounter: Payer: Self-pay | Admitting: Internal Medicine

## 2011-04-30 ENCOUNTER — Encounter: Payer: Medicaid Other | Admitting: Internal Medicine

## 2011-05-04 ENCOUNTER — Emergency Department (HOSPITAL_COMMUNITY): Payer: Medicaid Other | Admitting: Certified Registered"

## 2011-05-04 ENCOUNTER — Encounter (HOSPITAL_COMMUNITY): Payer: Self-pay | Admitting: Certified Registered"

## 2011-05-04 ENCOUNTER — Emergency Department (HOSPITAL_COMMUNITY): Payer: Medicaid Other

## 2011-05-04 ENCOUNTER — Encounter: Payer: Medicaid Other | Admitting: Internal Medicine

## 2011-05-04 ENCOUNTER — Encounter (HOSPITAL_COMMUNITY)
Admission: EM | Disposition: A | Discharge status: Home / Self Care | Payer: Self-pay | Source: Home / Self Care | Attending: Vascular Surgery

## 2011-05-04 ENCOUNTER — Inpatient Hospital Stay (HOSPITAL_COMMUNITY): Payer: Medicaid Other

## 2011-05-04 ENCOUNTER — Inpatient Hospital Stay (HOSPITAL_COMMUNITY)
Admission: EM | Admit: 2011-05-04 | Discharge: 2011-05-05 | DRG: 254 | Disposition: A | Discharge status: Home / Self Care | Payer: Medicaid Other | Attending: Vascular Surgery | Admitting: Vascular Surgery

## 2011-05-04 DIAGNOSIS — M79605 Pain in left leg: Secondary | ICD-10-CM

## 2011-05-04 DIAGNOSIS — F341 Dysthymic disorder: Secondary | ICD-10-CM | POA: Diagnosis present

## 2011-05-04 DIAGNOSIS — I70409 Unspecified atherosclerosis of autologous vein bypass graft(s) of the extremities, unspecified extremity: Principal | ICD-10-CM | POA: Diagnosis present

## 2011-05-04 DIAGNOSIS — E119 Type 2 diabetes mellitus without complications: Secondary | ICD-10-CM

## 2011-05-04 DIAGNOSIS — Z8673 Personal history of transient ischemic attack (TIA), and cerebral infarction without residual deficits: Secondary | ICD-10-CM

## 2011-05-04 DIAGNOSIS — K219 Gastro-esophageal reflux disease without esophagitis: Secondary | ICD-10-CM | POA: Diagnosis present

## 2011-05-04 DIAGNOSIS — J449 Chronic obstructive pulmonary disease, unspecified: Secondary | ICD-10-CM | POA: Diagnosis present

## 2011-05-04 DIAGNOSIS — I10 Essential (primary) hypertension: Secondary | ICD-10-CM | POA: Diagnosis present

## 2011-05-04 DIAGNOSIS — J4489 Other specified chronic obstructive pulmonary disease: Secondary | ICD-10-CM | POA: Diagnosis present

## 2011-05-04 DIAGNOSIS — I70219 Atherosclerosis of native arteries of extremities with intermittent claudication, unspecified extremity: Secondary | ICD-10-CM

## 2011-05-04 HISTORY — PX: FEMORAL-POPLITEAL BYPASS GRAFT: SHX937

## 2011-05-04 HISTORY — PX: PR VEIN BYPASS GRAFT,AORTO-FEM-POP: 35551

## 2011-05-04 LAB — CBC
HCT: 43.2 % (ref 39.0–52.0)
Hemoglobin: 15 g/dL (ref 13.0–17.0)
MCHC: 34.7 g/dL (ref 30.0–36.0)
MCV: 92.5 fL (ref 78.0–100.0)
WBC: 6.9 10*3/uL (ref 4.0–10.5)

## 2011-05-04 LAB — URINALYSIS, ROUTINE W REFLEX MICROSCOPIC
Bilirubin Urine: NEGATIVE
Hgb urine dipstick: NEGATIVE
Ketones, ur: NEGATIVE mg/dL
Protein, ur: NEGATIVE mg/dL
Specific Gravity, Urine: 1.021 (ref 1.005–1.030)
Urobilinogen, UA: 1 mg/dL (ref 0.0–1.0)

## 2011-05-04 LAB — BASIC METABOLIC PANEL
BUN: 17 mg/dL (ref 6–23)
Chloride: 104 mEq/L (ref 96–112)
Creatinine, Ser: 0.98 mg/dL (ref 0.50–1.35)
Glucose, Bld: 162 mg/dL — ABNORMAL HIGH (ref 70–99)
Potassium: 4.1 mEq/L (ref 3.5–5.1)

## 2011-05-04 LAB — MRSA PCR SCREENING: MRSA by PCR: NEGATIVE

## 2011-05-04 LAB — URINE MICROSCOPIC-ADD ON

## 2011-05-04 SURGERY — BYPASS GRAFT FEMORAL-POPLITEAL ARTERY
Anesthesia: General | Site: Leg Upper | Laterality: Right | Wound class: Clean

## 2011-05-04 MED ORDER — ROCURONIUM BROMIDE 100 MG/10ML IV SOLN
INTRAVENOUS | Status: DC | PRN
  Administered 2011-05-04: 50 mg via INTRAVENOUS

## 2011-05-04 MED ORDER — BISACODYL 10 MG RE SUPP: 10.0000 mg | Freq: Every day | RECTAL | Status: DC | PRN

## 2011-05-04 MED ORDER — ONDANSETRON HCL 4 MG/2ML IJ SOLN
INTRAMUSCULAR | Status: DC | PRN
  Administered 2011-05-04: 4 mg via INTRAVENOUS

## 2011-05-04 MED ORDER — SODIUM CHLORIDE 0.9 % IV SOLN
INTRAVENOUS | Status: DC
  Administered 2011-05-04: 21:00:00 via INTRAVENOUS

## 2011-05-04 MED ORDER — IOHEXOL 300 MG/ML  SOLN
INTRAMUSCULAR | Status: DC | PRN
  Administered 2011-05-04: 15 mL

## 2011-05-04 MED ORDER — GABAPENTIN 800 MG PO TABS
800.0000 mg | ORAL_TABLET | Freq: Three times a day (TID) | ORAL | Status: DC
  Administered 2011-05-04: 800 mg via ORAL
  Filled 2011-05-04 (×4): qty 1

## 2011-05-04 MED ORDER — PHENYLEPHRINE HCL 10 MG/ML IJ SOLN
10.0000 mg | INTRAVENOUS | Status: DC | PRN
  Administered 2011-05-04: 40 ug/min via INTRAVENOUS

## 2011-05-04 MED ORDER — METOPROLOL TARTRATE 1 MG/ML IV SOLN: 2.0000 mg | INTRAVENOUS | Status: DC | PRN

## 2011-05-04 MED ORDER — DEXTROSE 5 % IV SOLN
1.5000 g | Freq: Two times a day (BID) | INTRAVENOUS | Status: DC
  Administered 2011-05-05: 1.5 g via INTRAVENOUS
  Filled 2011-05-04 (×2): qty 1.5

## 2011-05-04 MED ORDER — ONDANSETRON HCL 4 MG/2ML IJ SOLN: 4.0000 mg | Freq: Four times a day (QID) | INTRAMUSCULAR | Status: DC | PRN

## 2011-05-04 MED ORDER — PANTOPRAZOLE SODIUM 40 MG PO TBEC
40.0000 mg | DELAYED_RELEASE_TABLET | Freq: Every day | ORAL | Status: DC
  Administered 2011-05-05: 40 mg via ORAL
  Filled 2011-05-04: qty 1

## 2011-05-04 MED ORDER — PROPOFOL 10 MG/ML IV EMUL
INTRAVENOUS | Status: DC | PRN
  Administered 2011-05-04: 150 mg via INTRAVENOUS

## 2011-05-04 MED ORDER — GUAIFENESIN-DM 100-10 MG/5ML PO SYRP: 15.0000 mL | ORAL_SOLUTION | ORAL | Status: DC | PRN

## 2011-05-04 MED ORDER — PHENYLEPHRINE HCL 10 MG/ML IJ SOLN
INTRAMUSCULAR | Status: DC | PRN
  Administered 2011-05-04 (×3): 100 ug via INTRAVENOUS

## 2011-05-04 MED ORDER — LIDOCAINE HCL (CARDIAC) 20 MG/ML IV SOLN
INTRAVENOUS | Status: DC | PRN
  Administered 2011-05-04: 40 mg via INTRAVENOUS

## 2011-05-04 MED ORDER — HEPARIN SODIUM (PORCINE) 1000 UNIT/ML IJ SOLN
INTRAMUSCULAR | Status: DC | PRN
  Administered 2011-05-04: 8000 [IU] via INTRAVENOUS

## 2011-05-04 MED ORDER — LACTATED RINGERS IV SOLN
INTRAVENOUS | Status: DC
  Administered 2011-05-04 (×3): via INTRAVENOUS

## 2011-05-04 MED ORDER — POLYETHYLENE GLYCOL 3350 17 G PO PACK
17.0000 g | PACK | Freq: Every day | ORAL | Status: DC | PRN
  Filled 2011-05-04: qty 1

## 2011-05-04 MED ORDER — CEFAZOLIN SODIUM 1-5 GM-% IV SOLN
INTRAVENOUS | Status: DC | PRN
  Administered 2011-05-04: 1 g via INTRAVENOUS

## 2011-05-04 MED ORDER — HYDROMORPHONE HCL PF 1 MG/ML IJ SOLN
1.0000 mg | INTRAMUSCULAR | Status: DC | PRN
  Administered 2011-05-04 – 2011-05-05 (×3): 1 mg via INTRAVENOUS
  Filled 2011-05-04 (×3): qty 1

## 2011-05-04 MED ORDER — SODIUM CHLORIDE 0.9 % IV SOLN: 500.0000 mL | Freq: Once | INTRAVENOUS | Status: AC | PRN

## 2011-05-04 MED ORDER — GUAIFENESIN-DM 100-10 MG/5ML PO SYRP
15.0000 mL | ORAL_SOLUTION | ORAL | Status: DC | PRN
Start: 2011-05-04 — End: 2011-05-05

## 2011-05-04 MED ORDER — 0.9 % SODIUM CHLORIDE (POUR BTL) OPTIME
TOPICAL | Status: DC | PRN
  Administered 2011-05-04: 2000 mL

## 2011-05-04 MED ORDER — SODIUM CHLORIDE 0.9 % IV SOLN: INTRAVENOUS | Status: DC

## 2011-05-04 MED ORDER — ACETAMINOPHEN 650 MG RE SUPP: 325.0000 mg | RECTAL | Status: DC | PRN

## 2011-05-04 MED ORDER — MIDAZOLAM HCL 5 MG/5ML IJ SOLN
INTRAMUSCULAR | Status: DC | PRN
  Administered 2011-05-04: 2 mg via INTRAVENOUS

## 2011-05-04 MED ORDER — PROTAMINE SULFATE 10 MG/ML IV SOLN
INTRAVENOUS | Status: DC | PRN
  Administered 2011-05-04: 50 mg via INTRAVENOUS

## 2011-05-04 MED ORDER — PANTOPRAZOLE SODIUM 40 MG PO TBEC: 40.0000 mg | DELAYED_RELEASE_TABLET | Freq: Every day | ORAL | Status: DC

## 2011-05-04 MED ORDER — DOPAMINE-DEXTROSE 3.2-5 MG/ML-% IV SOLN: 3.0000 ug/kg/min | INTRAVENOUS | Status: DC | PRN

## 2011-05-04 MED ORDER — METFORMIN HCL 500 MG PO TABS
500.0000 mg | ORAL_TABLET | Freq: Two times a day (BID) | ORAL | Status: DC
  Administered 2011-05-05: 500 mg via ORAL
  Filled 2011-05-04 (×3): qty 1

## 2011-05-04 MED ORDER — ONDANSETRON HCL 4 MG/2ML IJ SOLN
4.0000 mg | Freq: Once | INTRAMUSCULAR | Status: DC | PRN
Start: 2011-05-04 — End: 2011-05-04

## 2011-05-04 MED ORDER — HYDRALAZINE HCL 20 MG/ML IJ SOLN: 10.0000 mg | INTRAMUSCULAR | Status: DC | PRN

## 2011-05-04 MED ORDER — HYDROMORPHONE HCL PF 1 MG/ML IJ SOLN
INTRAMUSCULAR | Status: AC
  Filled 2011-05-04: qty 1

## 2011-05-04 MED ORDER — DOCUSATE SODIUM 100 MG PO CAPS
100.0000 mg | ORAL_CAPSULE | Freq: Every day | ORAL | Status: DC
  Administered 2011-05-05: 100 mg via ORAL
  Filled 2011-05-04: qty 1

## 2011-05-04 MED ORDER — HYDROMORPHONE HCL PF 1 MG/ML IJ SOLN
0.2500 mg | INTRAMUSCULAR | Status: DC | PRN
  Administered 2011-05-04 (×4): 0.5 mg via INTRAVENOUS

## 2011-05-04 MED ORDER — FENTANYL CITRATE 0.05 MG/ML IJ SOLN
INTRAMUSCULAR | Status: DC | PRN
  Administered 2011-05-04: 50 ug via INTRAVENOUS
  Administered 2011-05-04: 100 ug via INTRAVENOUS
  Administered 2011-05-04 (×3): 50 ug via INTRAVENOUS

## 2011-05-04 MED ORDER — OXYCODONE-ACETAMINOPHEN 5-325 MG PO TABS
1.0000 | ORAL_TABLET | ORAL | Status: DC | PRN
  Administered 2011-05-05 (×2): 2 via ORAL
  Filled 2011-05-04 (×2): qty 2

## 2011-05-04 MED ORDER — INSULIN ASPART 100 UNIT/ML ~~LOC~~ SOLN
0.0000 [IU] | Freq: Three times a day (TID) | SUBCUTANEOUS | Status: DC
  Administered 2011-05-05: 3 [IU] via SUBCUTANEOUS

## 2011-05-04 MED ORDER — HYDROXYZINE HCL 25 MG PO TABS
25.0000 mg | ORAL_TABLET | Freq: Two times a day (BID) | ORAL | Status: DC | PRN
  Filled 2011-05-04: qty 1

## 2011-05-04 MED ORDER — LISINOPRIL 5 MG PO TABS
5.0000 mg | ORAL_TABLET | Freq: Every day | ORAL | Status: DC
  Administered 2011-05-04 – 2011-05-05 (×2): 5 mg via ORAL
  Filled 2011-05-04 (×2): qty 1

## 2011-05-04 MED ORDER — POTASSIUM CHLORIDE CRYS ER 20 MEQ PO TBCR: 20.0000 meq | EXTENDED_RELEASE_TABLET | Freq: Once | ORAL | Status: AC | PRN

## 2011-05-04 MED ORDER — HYDROMORPHONE HCL PF 1 MG/ML IJ SOLN
0.5000 mg | INTRAMUSCULAR | Status: DC | PRN
  Administered 2011-05-04 (×3): 0.5 mg via INTRAVENOUS

## 2011-05-04 MED ORDER — ATORVASTATIN CALCIUM 40 MG PO TABS
40.0000 mg | ORAL_TABLET | Freq: Every day | ORAL | Status: DC
  Administered 2011-05-04: 40 mg via ORAL
  Filled 2011-05-04 (×2): qty 1

## 2011-05-04 MED ORDER — MORPHINE SULFATE 2 MG/ML IJ SOLN: 2.0000 mg | INTRAMUSCULAR | Status: DC | PRN

## 2011-05-04 MED ORDER — ALBUTEROL SULFATE HFA 108 (90 BASE) MCG/ACT IN AERS: 2.0000 | INHALATION_SPRAY | RESPIRATORY_TRACT | Status: DC | PRN

## 2011-05-04 MED ORDER — LABETALOL HCL 5 MG/ML IV SOLN: 10.0000 mg | INTRAVENOUS | Status: DC | PRN

## 2011-05-04 MED ORDER — ACETAMINOPHEN 325 MG PO TABS: 325.0000 mg | ORAL_TABLET | ORAL | Status: DC | PRN

## 2011-05-04 MED ORDER — SODIUM CHLORIDE 0.9 % IR SOLN
Status: DC | PRN
  Administered 2011-05-04: 14:00:00

## 2011-05-04 MED ORDER — ATENOLOL 50 MG PO TABS
50.0000 mg | ORAL_TABLET | Freq: Every day | ORAL | Status: DC
  Administered 2011-05-04 – 2011-05-05 (×2): 50 mg via ORAL
  Filled 2011-05-04 (×2): qty 1

## 2011-05-04 SURGICAL SUPPLY — 64 items
APL SKNCLS STERI-STRIP NONHPOA (GAUZE/BANDAGES/DRESSINGS) ×2
BAG ISL DRAPE 18X18 STRL (DRAPES) ×1
BAG ISOLATION DRAPE 18X18 (DRAPES) IMPLANT
BANDAGE ESMARK 6X9 LF (GAUZE/BANDAGES/DRESSINGS) IMPLANT
BENZOIN TINCTURE PRP APPL 2/3 (GAUZE/BANDAGES/DRESSINGS) ×3 IMPLANT
BNDG CMPR 9X6 STRL LF SNTH (GAUZE/BANDAGES/DRESSINGS)
BNDG ESMARK 6X9 LF (GAUZE/BANDAGES/DRESSINGS)
CANISTER SUCTION 2500CC (MISCELLANEOUS) ×2 IMPLANT
CANNULA VESSEL W/WING WO/VALVE (CANNULA) ×2 IMPLANT
CLIP LIGATING EXTRA MED SLVR (CLIP) ×2 IMPLANT
CLIP LIGATING EXTRA SM BLUE (MISCELLANEOUS) ×2 IMPLANT
CLOTH BEACON ORANGE TIMEOUT ST (SAFETY) ×2 IMPLANT
CLSR STERI-STRIP ANTIMIC 1/2X4 (GAUZE/BANDAGES/DRESSINGS) ×2 IMPLANT
COVER SURGICAL LIGHT HANDLE (MISCELLANEOUS) ×4 IMPLANT
CUFF TOURNIQUET SINGLE 34IN LL (TOURNIQUET CUFF) IMPLANT
CUFF TOURNIQUET SINGLE 44IN (TOURNIQUET CUFF) IMPLANT
DRAIN SNY 10X20 3/4 PERF (WOUND CARE) IMPLANT
DRAPE ISOLATION BAG 18X18 (DRAPES) ×1
DRAPE WARM FLUID 44X44 (DRAPE) ×2 IMPLANT
DRAPE X-RAY CASS 24X20 (DRAPES) ×1 IMPLANT
DRSG COVADERM 4X10 (GAUZE/BANDAGES/DRESSINGS) ×1 IMPLANT
DRSG COVADERM 4X6 (GAUZE/BANDAGES/DRESSINGS) ×1 IMPLANT
DRSG COVADERM 4X8 (GAUZE/BANDAGES/DRESSINGS) IMPLANT
ELECT REM PT RETURN 9FT ADLT (ELECTROSURGICAL) ×4
ELECTRODE REM PT RTRN 9FT ADLT (ELECTROSURGICAL) ×1 IMPLANT
EVACUATOR SILICONE 100CC (DRAIN) IMPLANT
GLOVE BIO SURGEON STRL SZ 6.5 (GLOVE) ×1 IMPLANT
GLOVE BIOGEL PI IND STRL 6.5 (GLOVE) IMPLANT
GLOVE BIOGEL PI IND STRL 7.0 (GLOVE) IMPLANT
GLOVE BIOGEL PI INDICATOR 6.5 (GLOVE) ×5
GLOVE BIOGEL PI INDICATOR 7.0 (GLOVE) ×1
GLOVE ECLIPSE 6.5 STRL STRAW (GLOVE) ×2 IMPLANT
GLOVE ORTHOPEDIC STR SZ6.5 (GLOVE) ×1 IMPLANT
GLOVE SS BIOGEL STRL SZ 7.5 (GLOVE) ×1 IMPLANT
GLOVE SUPERSENSE BIOGEL SZ 7.5 (GLOVE) ×1
GOWN EXTRA PROTECTION XL (GOWNS) ×1 IMPLANT
GOWN STRL NON-REIN LRG LVL3 (GOWN DISPOSABLE) ×8 IMPLANT
GRAFT PROPATEN W/RING 6X80X60 (Vascular Products) ×1 IMPLANT
INSERT FOGARTY SM (MISCELLANEOUS) IMPLANT
KIT BASIN OR (CUSTOM PROCEDURE TRAY) ×2 IMPLANT
KIT ROOM TURNOVER OR (KITS) ×2 IMPLANT
NS IRRIG 1000ML POUR BTL (IV SOLUTION) ×4 IMPLANT
PACK PERIPHERAL VASCULAR (CUSTOM PROCEDURE TRAY) ×2 IMPLANT
PAD ARMBOARD 7.5X6 YLW CONV (MISCELLANEOUS) ×4 IMPLANT
PADDING CAST COTTON 6X4 STRL (CAST SUPPLIES) IMPLANT
SET COLLECT BLD 21X3/4 12 (NEEDLE) ×1 IMPLANT
SPONGE GAUZE 4X4 12PLY (GAUZE/BANDAGES/DRESSINGS) ×2 IMPLANT
STAPLER VISISTAT 35W (STAPLE) IMPLANT
STOPCOCK 4 WAY LG BORE MALE ST (IV SETS) ×1 IMPLANT
STRIP CLOSURE SKIN 1/2X4 (GAUZE/BANDAGES/DRESSINGS) ×2 IMPLANT
SUT ETHILON 3 0 PS 1 (SUTURE) IMPLANT
SUT PROLENE 5 0 C 1 24 (SUTURE) ×2 IMPLANT
SUT PROLENE 6 0 CC (SUTURE) ×4 IMPLANT
SUT SILK 2 0 SH (SUTURE) ×2 IMPLANT
SUT VIC AB 2-0 CTX 36 (SUTURE) ×4 IMPLANT
SUT VIC AB 3-0 SH 27 (SUTURE) ×4
SUT VIC AB 3-0 SH 27X BRD (SUTURE) ×2 IMPLANT
SYR 3ML LL SCALE MARK (SYRINGE) ×1 IMPLANT
TOWEL OR 17X24 6PK STRL BLUE (TOWEL DISPOSABLE) ×7 IMPLANT
TOWEL OR 17X26 10 PK STRL BLUE (TOWEL DISPOSABLE) ×4 IMPLANT
TRAY FOLEY CATH 14FRSI W/METER (CATHETERS) ×2 IMPLANT
TUBING EXTENTION W/L.L. (IV SETS) ×1 IMPLANT
UNDERPAD 30X30 INCONTINENT (UNDERPADS AND DIAPERS) ×2 IMPLANT
WATER STERILE IRR 1000ML POUR (IV SOLUTION) ×2 IMPLANT

## 2011-05-04 NOTE — Anesthesia Postprocedure Evaluation (Signed)
  Anesthesia Post-op Note  Patient: [name]  Procedure(s) Performed: Procedure(s) (LRB): BYPASS GRAFT FEMORAL-POPLITEAL ARTERY (Right)  Patient Location: PACU  Anesthesia Type: General  Level of Consciousness: awake, alert  and oriented  Airway and Oxygen Therapy: Patient Spontanous Breathing and Patient connected to nasal cannula oxygen  Post-op Pain: moderate  Post-op Assessment: Post-op Vital signs reviewed, Patient's Cardiovascular Status Stable, Respiratory Function Stable, Patent Airway, No signs of Nausea or vomiting and Pain level controlled  Post-op Vital Signs: Reviewed and stable  Complications: No apparent anesthesia complications

## 2011-05-04 NOTE — ED Notes (Signed)
OR is ready for patient

## 2011-05-04 NOTE — ED Notes (Signed)
Spoke with Dr. Arbie Cookey and advised all labs WNL except glucose level of 162.

## 2011-05-04 NOTE — Preoperative (Signed)
Beta Blockers   Reason not to administer Beta Blockers:Atenolol taken 05-03-11 At 0630hrs

## 2011-05-04 NOTE — H&P (Signed)
I have examined the patient, reviewed and agree with above. The patient will 7 with a prior initial right femoropopliteal bypass to below the knee with pain in 1999. He subsequently had occlusion with successful treatment thrombolyzes in 2008. 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 He subsequently underwent resection of this with an above knee to below knee replacement with the more distal vein from his right ankle. He presents now with one-week history of progressive ischemia and since last night severe rest pain. His foot is cold and blanched he does have normal motor and slightly diminished sensory function. Palpable popliteal pulse. I recommended reexploration for occluded femoropopliteal bypass. He understands he may require revision and potentially even replacement with a new femoropopliteal bypass with prosthetic or vein from the left leg. Amiah Frohlich, MD 05/04/2011 1:03 PM

## 2011-05-04 NOTE — ED Notes (Signed)
Patient advised that Dr. Arbie Cookey will be in ED to see him once Dr. Arbie Cookey is out of surgery.
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
## 2011-05-04 NOTE — Op Note (Signed)
OPERATIVE REPORT  DATE OF SURGERY: 05/04/2011  PATIENT: [name], 51 y.o. 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 Replacement with new right femoral to below knee popliteal bypass with 6 mm ringed propatent Gore-Tex graft  SURGEON:  Gretta Began, M.D.  PHYSICIAN ASSISTANT: Roczniak  ANESTHESIA:  Gen.  EBL: 100 ml  Total I/O In: 2200 [I.V.:2200] Out: 400 [Urine:300; Blood:100]  BLOOD ADMINISTERED: None  DRAINS: None  SPECIMEN: None  COUNTS CORRECT:  YES  PLAN OF CARE: PACU stable   PATIENT DISPOSITION:  PACU - hemodynamically stable  PROCEDURE DETAILS: The patient presents today with a one-week history of right lower surety discomfort. He reports is been markedly worse over the past 24 hours and kept him from sleeping. His foot is cold and numb and painful. He was found to have diminished sensory function in his right foot his motor was intact urgency department. He did not have a palpable popliteal pulse and he had no Doppler flow in the level of his ankle. His past history was noted for right femoral to below-knee popliteal bypass in 1999. 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 He has had a patent graft since this revision. 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 He was recommended beginning operating room urgently for thrombectomy attempted and explained he may need revision of he has had further degeneration of his vein graft with a 55 year old graft.  Procedure in detail: The patient was taken up and placed  supine position where the area both legs were prepped and draped in the usual sterile fashion the left leg was imaged with ultrasound did have a patent saphenous vein from the groin down to the mid calf. Incision was made through the prior scar in the below-knee popliteal level with the medial approach. A section skin down to the vein to popliteal artery anastomosis. There was no pulse present. The vein was opened longitudinally near the arterial anastomosis. There was backbleeding with no clot noted in the popliteal artery. The Fogarty catheter passed proximally and could not pass the mid thigh. Clot was removed but the catheter would not pass any further. This reason an incision was made over the prior groin incision. This was carried down to isolate the saphenous vein graft at the femoral anastomosis. The patient had been given 8000 units of intravenous heparin prior to opening up to below-knee vein graft. He vein was occluded near the femoral anastomosis and the graft was opened transversely. Fogarty catheter would only pass approximately 10-15 cm in the proximal thigh and would not pass any further. Is felt that the pain degenerated pass a point of salvage and therefore decision is made to place a new femoral-popliteal bypass with prosthetic graft. Tunnel was created from the level of the below knee popliteal position to the level of the groin. A 6 mm propatent ringed graft was brought through the tunnel. The graft was spatulated and was sewn end-to-end to the old vein graft just distal to the femoral anastomosis. The vein to size with at this level was of good size. The anastomosis was tested and  found to be adequate. The vein was flushed with heparinized saline reoccluded the graft was cut to appropriate length and the distal vein graft was divided just above the level of the popliteal anastomosis. The vein was spatulated. The graft was also spatulated and sewn end-to-end to the vein graft just above the  popliteal artery anastomosis. Intraoperative arteriogram was obtained. This showed a good anastomosis with good size match with the below knee vein segment. The patient said native artery was quite small the popliteal segment he did have peroneal and anterior tibial runoff and there was no flow past the distal thigh calf. It did not appear to be any cut off and this appeared to be related to contrast not reaching this level. The patient did have Doppler flow in the anterior tibial at the ankle. Patient was given 50 mg of protamine to reverse the heparin. Wound irrigated with saline. Hemostasis with cautery. Wounds were closed with 2-0 Vicryl in the fascia in the groin and the popliteal space. Skin was closed with 30 subcutaneous Vicryl stitch. Sterile dressing was applied and the patient was taken to the recovery room in stable condition   Gretta Began, M.D. 05/04/2011 4:58 PM

## 2011-05-04 NOTE — ED Notes (Signed)
At this time patient right foot is pink/red and cold to tough with no pain. Patient states that when pain starts in his right leg then his right foot turns pale/white and the foot remains cold at all times.

## 2011-05-04 NOTE — OR Nursing (Signed)
Camouflage boxer shorts with gray waistband removed in the OR and placed into plastic bag with patient label. Will transfer with patient post op to PACU.

## 2011-05-04 NOTE — ED Notes (Signed)
Patient remains on monitor and continues to have episodes of pain and during pain the right leg and right foot change color of pink/red to white. The right foot feels cold at all time.
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
## 2011-05-04 NOTE — ED Notes (Signed)
Patient's son, [name] is Health Care Power of Attorney. Son states this is on file her at [facility].

## 2011-05-04 NOTE — Transfer of Care (Signed)
Immediate Anesthesia Transfer of Care Note  Patient: [name]  Procedure(s) Performed: Procedure(s) (LRB): BYPASS GRAFT FEMORAL-POPLITEAL ARTERY (Right)  Patient Location: PACU  Anesthesia Type: General  Level of Consciousness: awake, alert , oriented and patient cooperative  Airway & Oxygen Therapy: Patient Spontanous Breathing and Patient connected to nasal cannula oxygen  Post-op Assessment: Report given to PACU RN, Post -op Vital signs reviewed and stable and Patient moving all extremities X 4  Post vital signs: Reviewed and stable  Complications: No apparent anesthesia complications

## 2011-05-04 NOTE — Anesthesia Procedure Notes (Signed)
Procedure Name: Intubation Date/Time: 05/04/2011 1:26 PM Performed by: Arlice Colt B Pre-anesthesia Checklist: Patient identified, Emergency Drugs available, Suction available, Patient being monitored and Timeout performed Patient Re-evaluated:Patient Re-evaluated prior to inductionOxygen Delivery Method: Circle system utilized Preoxygenation: Pre-oxygenation with 100% oxygen Intubation Type: IV induction Ventilation: Mask ventilation without difficulty Laryngoscope Size: Mac and 3 Grade View: Grade II Tube type: Oral Tube size: 8.0 mm Number of attempts: 1 Airway Equipment and Method: Stylet Placement Confirmation: ETT inserted through vocal cords under direct vision,  breath sounds checked- equal and bilateral and positive ETCO2 Secured at: 22 cm Tube secured with: Tape Dental Injury: Teeth and Oropharynx as per pre-operative assessment

## 2011-05-04 NOTE — Anesthesia Preprocedure Evaluation (Addendum)
Anesthesia Evaluation  Patient identified by MRN, date of birth, ID band Patient awake    Reviewed: Allergy & Precautions, H&P , NPO status , Patient's Chart, lab work & pertinent test results, reviewed documented beta blocker date and time   Airway Mallampati: II TM Distance: >3 FB Neck ROM: Full    Dental  (+) Edentulous Upper and Partial Lower   Pulmonary sleep apnea , COPD   + decreased breath sounds      Cardiovascular hypertension, Pt. on medications and Pt. on home beta blockers Rhythm:Regular Rate:Normal     Neuro/Psych PSYCHIATRIC DISORDERS Anxiety Depression CVA, No Residual Symptoms    GI/Hepatic GERD-  ,(+) Hepatitis -  Endo/Other  Diabetes mellitus-, Type 2, Oral Hypoglycemic Agents  Renal/GU      Musculoskeletal   Abdominal   Peds  Hematology   Anesthesia Other Findings   Reproductive/Obstetrics                          Anesthesia Physical Anesthesia Plan  ASA: III and Emergent  Anesthesia Plan: General   Post-op Pain Management:    Induction: Intravenous, Rapid sequence and Cricoid pressure planned  Airway Management Planned: Oral ETT  Additional Equipment:   Intra-op Plan:   Post-operative Plan: Extubation in OR  Informed Consent: I have reviewed the patients History and Physical, chart, labs and discussed the procedure including the risks, benefits and alternatives for the proposed anesthesia with the patient or authorized representative who has indicated his/her understanding and acceptance.   Dental advisory given  Plan Discussed with: CRNA and Anesthesiologist  Anesthesia Plan Comments:         Anesthesia Quick Evaluation

## 2011-05-05 ENCOUNTER — Encounter (HOSPITAL_COMMUNITY): Payer: Self-pay | Admitting: *Deleted

## 2011-05-05 ENCOUNTER — Other Ambulatory Visit: Payer: Self-pay | Admitting: Thoracic Diseases

## 2011-05-05 DIAGNOSIS — I739 Peripheral vascular disease, unspecified: Secondary | ICD-10-CM

## 2011-05-05 LAB — GLUCOSE, CAPILLARY: Glucose-Capillary: 190 mg/dL — ABNORMAL HIGH (ref 70–99)

## 2011-05-05 LAB — COMPREHENSIVE METABOLIC PANEL
Alkaline Phosphatase: 78 U/L (ref 39–117)
BUN: 14 mg/dL (ref 6–23)
CO2: 24 mEq/L (ref 19–32)
Chloride: 104 mEq/L (ref 96–112)
Creatinine, Ser: 0.8 mg/dL (ref 0.50–1.35)
GFR calc Af Amer: >90 mL/min (ref >90)
GFR calc non Af Amer: >90 mL/min (ref >90)
Glucose, Bld: 123 mg/dL — ABNORMAL HIGH (ref 70–99)
Potassium: 4.1 mEq/L (ref 3.5–5.1)
Total Bilirubin: 0.4 mg/dL (ref 0.3–1.2)

## 2011-05-05 LAB — CBC
HCT: 38.5 % — ABNORMAL LOW (ref 39.0–52.0)
MCHC: 33.8 g/dL (ref 30.0–36.0)
MCV: 93.4 fL (ref 78.0–100.0)
Platelets: 189 10*3/uL (ref 150–400)
RDW: 13.6 % (ref 11.5–15.5)

## 2011-05-05 LAB — PROTIME-INR
INR: 1.09 (ref 0.00–1.49)
Prothrombin Time: 14.3 seconds (ref 11.6–15.2)

## 2011-05-05 MED ORDER — OXYCODONE-ACETAMINOPHEN 5-325 MG PO TABS: 1.0000 | ORAL_TABLET | ORAL | Status: DC | PRN

## 2011-05-05 NOTE — Progress Notes (Signed)
PT/OT Screen and Sign Off Note  PT/OT evals not completed secondary to pt and RN noting pt near baseline and no PT/OT needs at this time.  Pt has already been up mobilizing with RW as he does at baseline.  Pt to D/C home today.  Will sign off.    Sunny Schlein, [place] [phone] 05/05/2011, 8:03 AM

## 2011-05-05 NOTE — Progress Notes (Signed)
[facility] [place]  Progress Note Bypass Surgery  Date of Surgery: 05/04/2011  Procedure(s): right redo BYPASS GRAFT FEMORAL-POPLITEAL ARTERY Surgeon: Surgeon(s): Larina Earthly, MD  1 Day Post-Op  History of Present Illness  [name] is a 51 y.o. male who is doing well this am and wants to go home. Has ambulated and voided. The patient's pre-op symptoms of RLE pain are much improved. pt states his leg hasn't felt this good in years . Patients pain is well controlled.    Significant Diagnostic Studies: CBC Lab Results  Component Value Date   WBC 9.6 05/05/2011   HGB 13.0 05/05/2011   HCT 38.5* 05/05/2011   MCV 93.4 05/05/2011   PLT 189 05/05/2011    BMET     Component Value Date/Time   NA 137 05/05/2011 0429   K 4.1 05/05/2011 0429   CL 104 05/05/2011 0429   CO2 24 05/05/2011 0429   GLUCOSE 123* 05/05/2011 0429   BUN 14 05/05/2011 0429   CREATININE 0.80 05/05/2011 0429   CALCIUM 8.2* 05/05/2011 0429   CALCIUM 9.1 10/28/2009 2118   GFRNONAA >90 05/05/2011 0429   GFRAA >90 05/05/2011 0429    COAG Lab Results  Component Value Date   INR 1.09 05/05/2011   INR 1.02 05/04/2011   INR 1.00 04/09/2011   No results found for this basename: PTT    Physical Examination  BP Readings from Last 3 Encounters:  05/05/11 99/55  05/05/11 99/55  04/09/11 147/75   Temp Readings from Last 3 Encounters:  05/05/11 98.1 F (36.7 C) Oral  05/05/11 98.1 F (36.7 C) Oral  04/09/11 96.8 F (36 C) Oral   SpO2 Readings from Last 3 Encounters:  05/05/11 92%  05/05/11 92%  04/09/11 93%   Pulse Readings from Last 3 Encounters:  05/05/11 70  05/05/11 70  04/09/11 105    Pt is A&O x 3 right lower extremity: Incision/s is/are clean,dry.intact, and  Draining from calf wound - old hematoma, Limb is warm; with good color  Right peroneal pulse is monophasic by Doppler Right DP and Posterior tibial pulse is  absent  Left Dorsalis Pedis pulse is monophasic by  Doppler LeftPosterior tibial pulse is  biphasic by Doppler   Assessment/Plan: Pt. Doing well Post-op pain is controlled Wounds are healing well with min old blood draining from wound  ambulating and voiding  Continue wound care as ordered Plan DC today - F/U in office with ABI's  Marlowe Shores [phone] 05/05/2011 8:26 AM
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
## 2011-05-05 NOTE — Progress Notes (Signed)
Agree with above.   Cassandria Anger, OTR/L Pager: [phone] 05/05/2011 .

## 2011-05-07 ENCOUNTER — Telehealth: Payer: Self-pay | Admitting: *Deleted

## 2011-05-07 NOTE — Telephone Encounter (Signed)
Pt called stating he was put in hospital on Monday (4/22) and he was put in isolation because he was labeled to have MRSA per records from our clinic. Pt is upset and states he has never had MRSA in his life.  He wants his records corrected.   I talked with Dr Manson Passey and he could not find any Hx of MRSA in pt's record.  Pt was informed and will ask Dr Arbie Cookey why he was put in isolation.

## 2011-05-14 ENCOUNTER — Other Ambulatory Visit: Payer: Self-pay | Admitting: *Deleted

## 2011-05-14 NOTE — Telephone Encounter (Signed)
Pt is changing to [facility].

## 2011-05-15 MED ORDER — PANTOPRAZOLE SODIUM 40 MG PO TBEC: 40.0000 mg | DELAYED_RELEASE_TABLET | Freq: Every day | ORAL | Status: DC

## 2011-05-15 MED ORDER — LANCETS MISC: Status: DC

## 2011-05-15 MED ORDER — GLUCOSE BLOOD VI STRP: ORAL_STRIP | Status: DC

## 2011-05-15 MED ORDER — ALBUTEROL SULFATE HFA 108 (90 BASE) MCG/ACT IN AERS: 2.0000 | INHALATION_SPRAY | RESPIRATORY_TRACT | Status: DC | PRN

## 2011-05-25 ENCOUNTER — Encounter: Payer: Self-pay | Admitting: Vascular Surgery

## 2011-05-26 ENCOUNTER — Encounter: Payer: Self-pay | Admitting: Vascular Surgery

## 2011-05-26 ENCOUNTER — Encounter (INDEPENDENT_AMBULATORY_CARE_PROVIDER_SITE_OTHER): Payer: Medicaid Other | Admitting: *Deleted

## 2011-05-26 ENCOUNTER — Telehealth: Payer: Self-pay | Admitting: Vascular Surgery

## 2011-05-26 ENCOUNTER — Ambulatory Visit (INDEPENDENT_AMBULATORY_CARE_PROVIDER_SITE_OTHER): Payer: Medicaid Other | Admitting: Vascular Surgery

## 2011-05-26 VITALS — BP 113/69 | HR 87 | Resp 16 | Ht 73.0 in | Wt 190.6 lb

## 2011-05-26 DIAGNOSIS — I739 Peripheral vascular disease, unspecified: Secondary | ICD-10-CM

## 2011-05-26 DIAGNOSIS — I70229 Atherosclerosis of native arteries of extremities with rest pain, unspecified extremity: Secondary | ICD-10-CM

## 2011-05-26 NOTE — Telephone Encounter (Signed)
4:30 pm: [name] is at the pharmacy with a handwritten prescription from Dr.Early for Tylox.  Youth worker (pharmacists) wanted to know if we were aware that [name] has a current prescription for Seboxin (sp?) which is prescribed by Dr. Elyn Aquas at the [facility].  Per Traci Sermon is a medication prescribed for patients who are addicted to opiates.  Dr. Arbie Cookey spoke with Dorene Grebe and decided tha,t since the patient is 10 days out from surgery and was discharged on narcotics, that he can have the Tylox prescription filled as written . Dr. Arbie Cookey asked Dorene Grebe to inform the patient that he is not to take the Seboxin while he is taking Tylox.  Dr. Arbie Cookey informed the patient during his office visit, today, that there would be no refill of his Tylox.

## 2011-05-26 NOTE — Progress Notes (Signed)
The patient presents today for followup of his redo right femoral-popliteal bypass 05/04/2011. He had presented to the emergency department with an occluded vein femoropopliteal with profound ischemia. He was taken to the operating room. He had a inability to thrombectomize his vein bypass and had placement of a Gore-Tex femoral to below-knee popliteal bypass. He reports the usual amount of postoperative discomfort was particularly at night. He also reports unusual sensation of turning in his right foot after sexual intercourse. I explained that I could not explain this relationship between this is bypass. 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  Physical exam reveals a well-healed groin and popliteal incisions. His foot is warm pink and well perfused. I do not palpate pedal pulses. He does have good Doppler flow in the distal vessels. Ankle arm index is 0.68 on the right and 0.74 on the left.  Impression and plan stable status post Gore-Tex redo femoropopliteal bypass for profound ischemia. The patient will continue his usual activities we will see him again in 3 months for repeat vascular status studies. He did request pain medication and was given Tylox #30 no refills. He understands that we will not give him any further narcotics for pain after this prescription.

## 2011-05-29 ENCOUNTER — Telehealth: Payer: Self-pay | Admitting: *Deleted

## 2011-05-29 NOTE — Telephone Encounter (Signed)
Problems with feet and legs burning. Talked  with Dr Arbie Cookey at last visit - told pt he has good blood flow. Pt states CBG are running about 157. Appt made . Stanton Kidney Benford Asch RN 05/29/11 11:15AM

## 2011-05-30 IMAGING — CT CT HEAD W/O CM
1 series · 12 of 14 positions shown, 15 images · non-contrast
Comparison: 08/20/2008

CLINICAL DATA: Confusion, shortness of breath, prior left MCA
infarction

CT HEAD WITHOUT CONTRAST
TECHNIQUE: Contiguous axial images were obtained from the base of
the skull through the vertex without contrast.

[Series 2: head routine 4.8 h37s · axial · 0.43mm/px · z∈[-141,+4]mm · 12 of 36 slices shown, 15 images]
[im 3/36  soft-tissue]
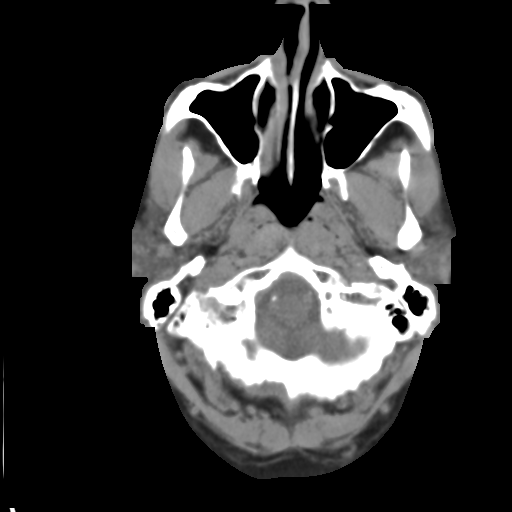
[im 3/36  bone]
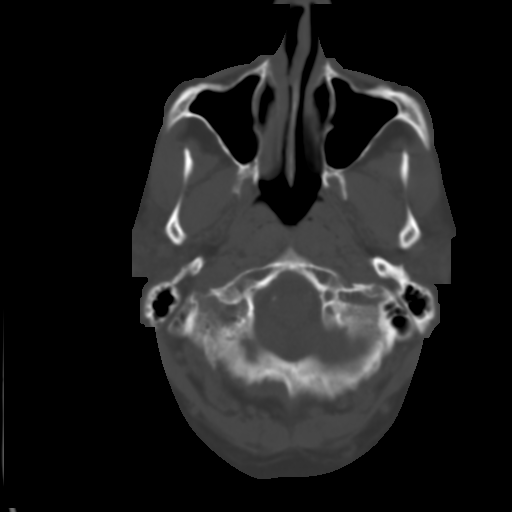
[im 6/36  bone]
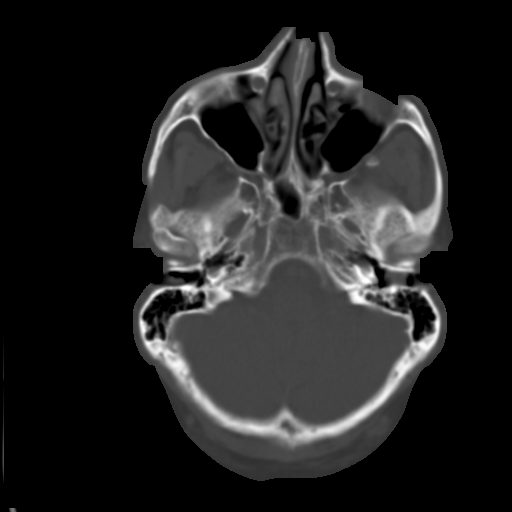
[im 9/36  bone]
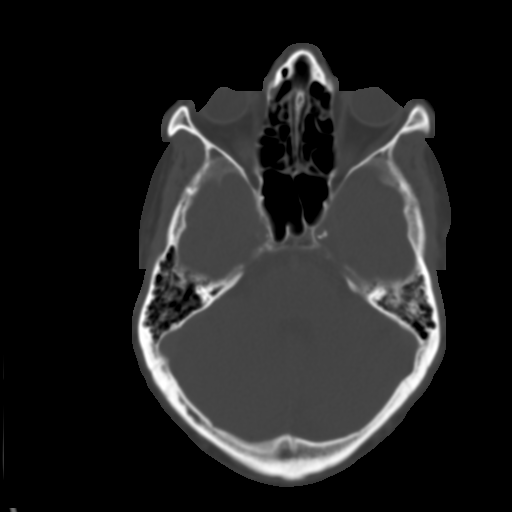
[im 11/36  bone]
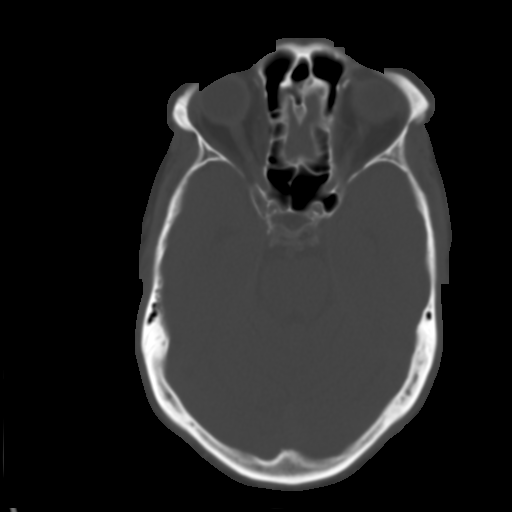
[im 14/36  soft-tissue]
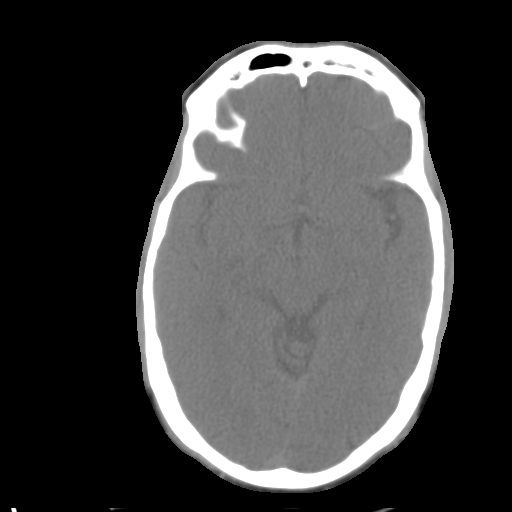
[im 14/36  bone]
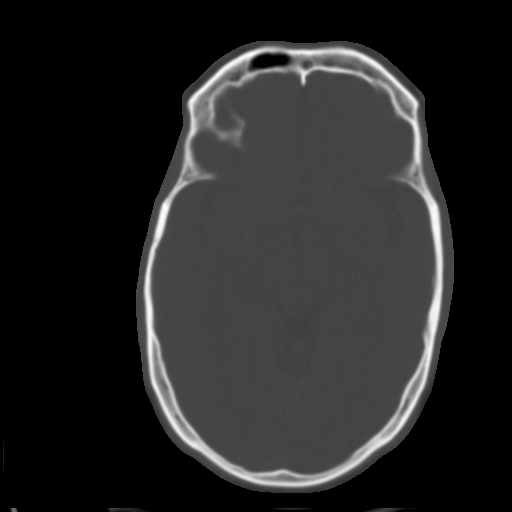
[im 17/36  bone]
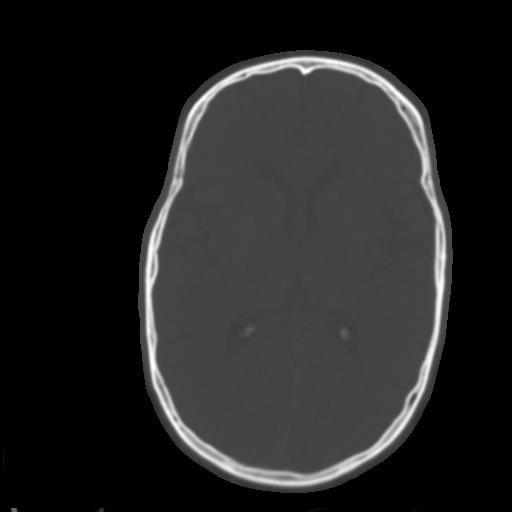
[im 19/36  bone]
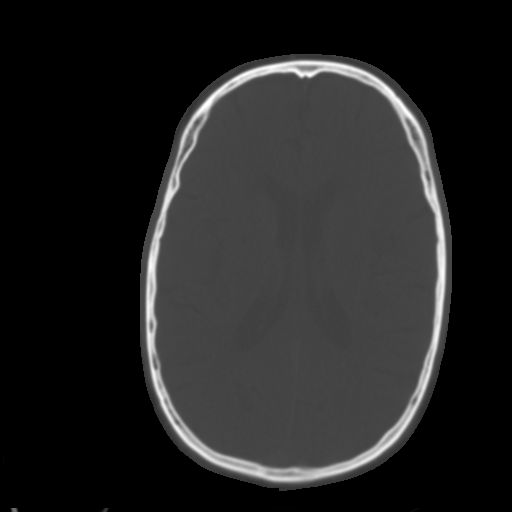
[im 22/36  bone]
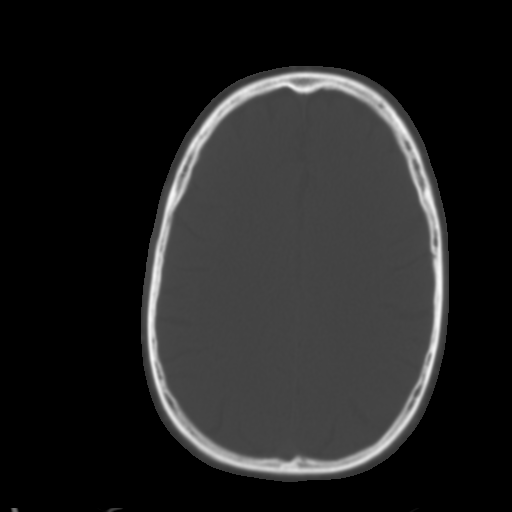
[im 25/36  soft-tissue]
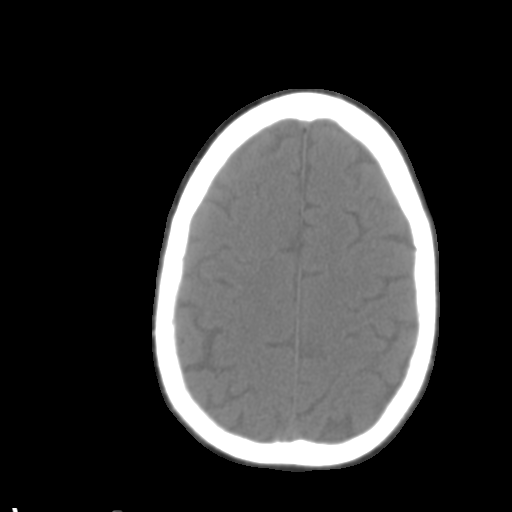
[im 25/36  bone]
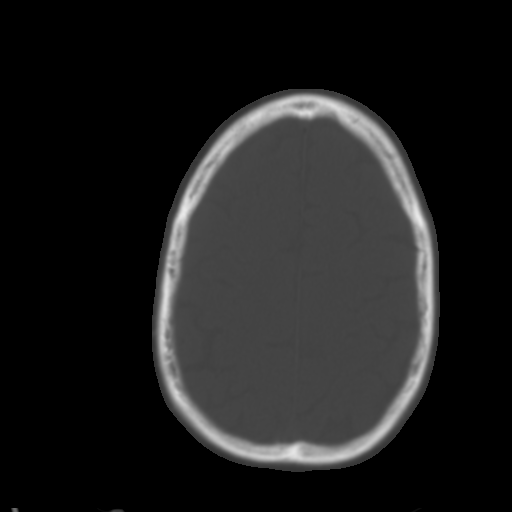
[im 27/36  bone]
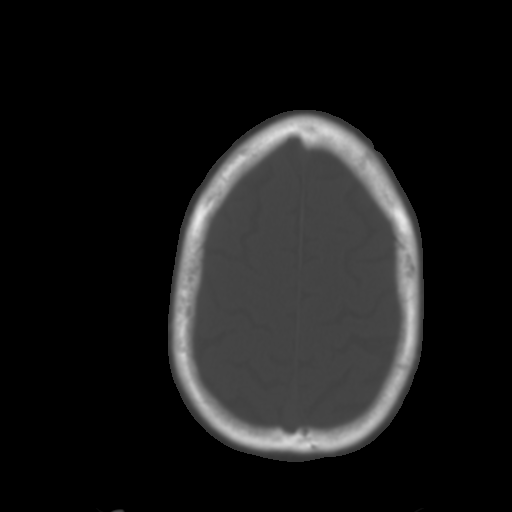
[im 30/36  bone]
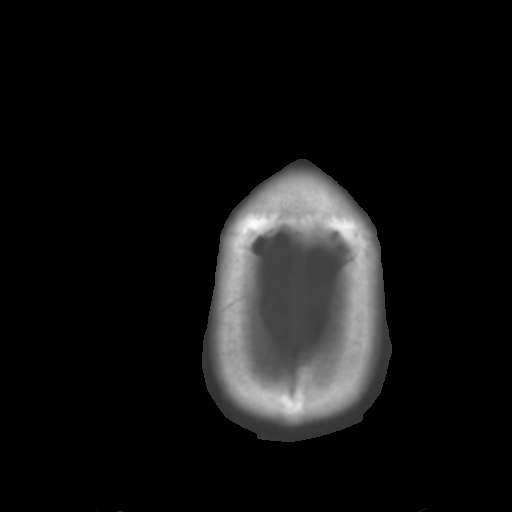
[im 33/36  bone]
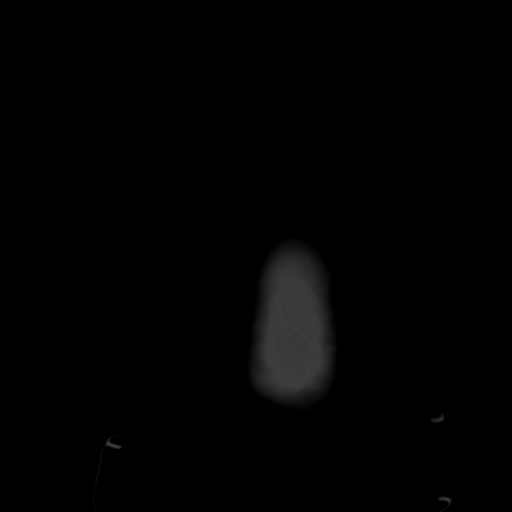

[12 of 14 positions shown; findings below may reference images not displayed]

FINDINGS: Small area of residual encephalomalacia in the left
temporal lobe from prior infarct.  No acute intracranial
hemorrhage, acute infarction, focal mass lesion, focal edema,
midline shift, herniation, hydrocephalus, or extra-axial fluid
collection.  Gray and white matter differentiation maintained.
Cisterns patent.  Mastoids and sinuses clear.
IMPRESSION: Remote left temporal infarct.
No acute intracranial process by noncontrast CT.

## 2011-06-01 ENCOUNTER — Encounter: Payer: Self-pay | Admitting: Internal Medicine

## 2011-06-01 ENCOUNTER — Ambulatory Visit (INDEPENDENT_AMBULATORY_CARE_PROVIDER_SITE_OTHER): Payer: Medicaid Other | Admitting: Internal Medicine

## 2011-06-01 VITALS — BP 112/63 | HR 78 | Temp 97.4°F | Ht 73.0 in | Wt 191.5 lb

## 2011-06-01 DIAGNOSIS — E119 Type 2 diabetes mellitus without complications: Secondary | ICD-10-CM

## 2011-06-01 DIAGNOSIS — I739 Peripheral vascular disease, unspecified: Secondary | ICD-10-CM

## 2011-06-01 DIAGNOSIS — G8929 Other chronic pain: Secondary | ICD-10-CM

## 2011-06-01 DIAGNOSIS — F17200 Nicotine dependence, unspecified, uncomplicated: Secondary | ICD-10-CM

## 2011-06-01 DIAGNOSIS — M79609 Pain in unspecified limb: Secondary | ICD-10-CM

## 2011-06-01 MED ORDER — PROMETHAZINE HCL 12.5 MG PO TABS: 12.5000 mg | ORAL_TABLET | Freq: Four times a day (QID) | ORAL | Status: DC | PRN
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
## 2011-06-01 NOTE — Patient Instructions (Signed)
You were seen today for your leg pain. We will get you in to see [name] for that. STOP SMOKING TODAY! This is one thing YOU can do for your pain. We will also give you a compression stocking prescription and do a urine test today. We will see you back as needed with your regular doctor. Your doctor today was Dr. Dorise Hiss. Our number is [phone] if you have any questions or problems.   Smoking Cessation, Tips for Success YOU CAN QUIT SMOKING If you are ready to quit smoking, congratulations! You have chosen to help yourself be healthier. Cigarettes bring nicotine, tar, carbon monoxide, and other irritants into your body. Your lungs, heart, and blood vessels will be able to work better without these poisons. There are many different ways to quit smoking. Nicotine gum, nicotine patches, a nicotine inhaler, or nicotine nasal spray can help with physical craving. Hypnosis, support groups, and medicines help break the habit of smoking. Here are some tips to help you quit for good.  Throw away all cigarettes.   Clean and remove all ashtrays from your home, work, and car.   On a card, write down your reasons for quitting. Carry the card with you and read it when you get the urge to smoke.   Cleanse your body of nicotine. Drink enough water and fluids to keep your urine clear or pale yellow. Do this after quitting to flush the nicotine from your body.   Learn to predict your moods. Do not let a bad situation be your excuse to have a cigarette. Some situations in your life might tempt you into wanting a cigarette.   Never have "just one" cigarette. It leads to wanting another and another. Remind yourself of your decision to quit.   Change habits associated with smoking. If you smoked while driving or when feeling stressed, try other activities to replace smoking. Stand up when drinking your coffee. Brush your teeth after eating. Sit in a different chair when you read the paper. Avoid alcohol while  trying to quit, and try to drink fewer caffeinated beverages. Alcohol and caffeine may urge you to smoke.   Avoid foods and drinks that can trigger a desire to smoke, such as sugary or spicy foods and alcohol.   Ask people who smoke not to smoke around you.   Have something planned to do right after eating or having a cup of coffee. Take a walk or exercise to perk you up. This will help to keep you from overeating.   Try a relaxation exercise to calm you down and decrease your stress. Remember, you may be tense and nervous for the first 2 weeks after you quit, but this will pass.   Find new activities to keep your hands busy. Play with a pen, coin, or rubber band. Doodle or draw things on paper.   Brush your teeth right after eating. This will help cut down on the craving for the taste of tobacco after meals. You can try mouthwash, too.   Use oral substitutes, such as lemon drops, carrots, a cinnamon stick, or chewing gum, in place of cigarettes. Keep them handy so they are available when you have the urge to smoke.   When you have the urge to smoke, try deep breathing.   Designate your home as a nonsmoking area.   If you are a heavy smoker, ask your caregiver about a prescription for nicotine chewing gum. It can ease your withdrawal from nicotine.   Reward  yourself. Set aside the cigarette money you save and buy yourself something nice.   Look for support from others. Join a support group or smoking cessation program. Ask someone at home or at work to help you with your plan to quit smoking.   Always ask yourself, "Do I need this cigarette or is this just a reflex?" Tell yourself, "Today, I choose not to smoke," or "I do not want to smoke." You are reminding yourself of your decision to quit, even if you do smoke a cigarette.  HOW WILL I FEEL WHEN I QUIT SMOKING?  The benefits of not smoking start within days of quitting.   You may have symptoms of withdrawal because your body is used  to nicotine (the addictive substance in cigarettes). You may crave cigarettes, be irritable, feel very hungry, cough often, get headaches, or have difficulty concentrating.   The withdrawal symptoms are only temporary. They are strongest when you first quit but will go away within 10 to 14 days.   When withdrawal symptoms occur, stay in control. Think about your reasons for quitting. Remind yourself that these are signs that your body is healing and getting used to being without cigarettes.   Remember that withdrawal symptoms are easier to treat than the major diseases that smoking can cause.   Even after the withdrawal is over, expect periodic urges to smoke. However, these cravings are generally short-lived and will go away whether you smoke or not. Do not smoke!   If you relapse and smoke again, do not lose hope. Most smokers quit 3 times before they are successful.   If you relapse, do not give up! Plan ahead and think about what you will do the next time you get the urge to smoke.  LIFE AS A NONSMOKER: MAKE IT FOR A MONTH, MAKE IT FOR LIFE Day 1: Hang this page where you will see it every day. Day 2: Get rid of all ashtrays, matches, and lighters. Day 3: Drink water. Breathe deeply between sips. Day 4: Avoid places with smoke-filled air, such as bars, clubs, or the smoking section of restaurants. Day 5: Keep track of how much money you save by not smoking. Day 6: Avoid boredom. Keep a good book with you or go to the movies. Day 7: Reward yourself! One week without smoking! Day 8: Make a dental appointment to get your teeth cleaned. Day 9: Decide how you will turn down a cigarette before it is offered to you. Day 10: Review your reasons for quitting. Day 11: Distract yourself. Stay active to keep your mind off smoking and to relieve tension. Take a walk, exercise, read a book, do a crossword puzzle, or try a new hobby. Day 12: Exercise. Get off the bus before your stop or use stairs  instead of escalators. Day 13: Call on friends for support and encouragement. Day 14: Reward yourself! Two weeks without smoking! Day 15: Practice deep breathing exercises. Day 16: Bet a friend that you can stay a nonsmoker. Day 17: Ask to sit in nonsmoking sections of restaurants. Day 18: Hang up "No Smoking" signs. Day 19: Think of yourself as a nonsmoker. Day 20: Each morning, tell yourself you will not smoke. Day 21: Reward yourself! Three weeks without smoking! Day 22: Think of smoking in negative ways. Remember how it stains your teeth, gives you bad breath, and leaves you short of breath. Day 23: Eat a nutritious breakfast. Day 24:Do not relive your days as a smoker. Day  25: Hold a pencil in your hand when talking on the telephone. Day 26: Tell all your friends you do not smoke. Day 27: Think about how much better food tastes. Day 28: Remember, one cigarette is one too many. Day 29: Take up a hobby that will keep your hands busy. Day 30: Congratulations! One month without smoking! Give yourself a big reward. Your caregiver can direct you to community resources or hospitals for support, which may include:  Group support.   Education.   Hypnosis.   Subliminal therapy.  Document Released: 09/27/2003 Document Revised: 12/18/2010 Document Reviewed: 10/15/2008 [facility] Patient Information 2012 [place], [place].

## 2011-06-02 ENCOUNTER — Ambulatory Visit: Payer: Medicaid Other | Admitting: Internal Medicine

## 2011-06-02 DIAGNOSIS — I70229 Atherosclerosis of native arteries of extremities with rest pain, unspecified extremity: Secondary | ICD-10-CM

## 2011-06-02 LAB — PRESCRIPTION ABUSE MONITORING 15P, URINE
Amphetamine/Meth: NEGATIVE ng/mL
Barbiturate Screen, Urine: NEGATIVE ng/mL
Benzodiazepine Screen, Urine: NEGATIVE ng/mL
Cannabinoid Scrn, Ur: NEGATIVE ng/mL
Cocaine Metabolites: NEGATIVE ng/mL
Creatinine, Urine: 84.42 mg/dL (ref >20.0)
Fentanyl, Ur: NEGATIVE ng/mL
Meperidine, Ur: NEGATIVE ng/mL
Methadone Screen, Urine: NEGATIVE ng/mL
Propoxyphene: NEGATIVE ng/mL
Tramadol Scrn, Ur: NEGATIVE ng/mL
Zolpidem, Urine: NEGATIVE ng/mL

## 2011-06-04 LAB — OPIATES/OPIOIDS (LC/MS-MS)
Heroin (6-AM), UR: NEGATIVE NG/ML
Oxymorphone: 85 NG/ML — ABNORMAL HIGH

## 2011-06-08 ENCOUNTER — Emergency Department (HOSPITAL_COMMUNITY): Payer: Medicaid Other

## 2011-06-08 ENCOUNTER — Encounter (HOSPITAL_COMMUNITY): Payer: Self-pay | Admitting: Adult Health

## 2011-06-08 ENCOUNTER — Emergency Department (HOSPITAL_COMMUNITY)
Admission: EM | Admit: 2011-06-08 | Discharge: 2011-06-08 | Disposition: A | Discharge status: Home / Self Care | Payer: Medicaid Other | Attending: Emergency Medicine | Admitting: Emergency Medicine

## 2011-06-08 DIAGNOSIS — E119 Type 2 diabetes mellitus without complications: Secondary | ICD-10-CM | POA: Insufficient documentation

## 2011-06-08 DIAGNOSIS — E785 Hyperlipidemia, unspecified: Secondary | ICD-10-CM | POA: Insufficient documentation

## 2011-06-08 DIAGNOSIS — L0390 Cellulitis, unspecified: Secondary | ICD-10-CM

## 2011-06-08 DIAGNOSIS — F17200 Nicotine dependence, unspecified, uncomplicated: Secondary | ICD-10-CM | POA: Insufficient documentation

## 2011-06-08 DIAGNOSIS — I10 Essential (primary) hypertension: Secondary | ICD-10-CM | POA: Insufficient documentation

## 2011-06-08 DIAGNOSIS — Z8673 Personal history of transient ischemic attack (TIA), and cerebral infarction without residual deficits: Secondary | ICD-10-CM | POA: Insufficient documentation

## 2011-06-08 DIAGNOSIS — Z981 Arthrodesis status: Secondary | ICD-10-CM | POA: Insufficient documentation

## 2011-06-08 DIAGNOSIS — Z79899 Other long term (current) drug therapy: Secondary | ICD-10-CM | POA: Insufficient documentation

## 2011-06-08 DIAGNOSIS — Z951 Presence of aortocoronary bypass graft: Secondary | ICD-10-CM | POA: Insufficient documentation

## 2011-06-08 DIAGNOSIS — L089 Local infection of the skin and subcutaneous tissue, unspecified: Secondary | ICD-10-CM | POA: Insufficient documentation

## 2011-06-08 MED ORDER — OXYCODONE-ACETAMINOPHEN 5-325 MG PO TABS: 1.0000 | ORAL_TABLET | ORAL | Status: DC | PRN

## 2011-06-08 MED ORDER — HYDROMORPHONE HCL PF 1 MG/ML IJ SOLN
1.0000 mg | Freq: Once | INTRAMUSCULAR | Status: AC
  Administered 2011-06-08: 1 mg via INTRAVENOUS
  Filled 2011-06-08: qty 1

## 2011-06-08 MED ORDER — ONDANSETRON HCL 4 MG/2ML IJ SOLN
4.0000 mg | Freq: Once | INTRAMUSCULAR | Status: AC
  Administered 2011-06-08: 4 mg via INTRAVENOUS
  Filled 2011-06-08: qty 2

## 2011-06-08 MED ORDER — SULFAMETHOXAZOLE-TRIMETHOPRIM 800-160 MG PO TABS: 1.0000 | ORAL_TABLET | Freq: Two times a day (BID) | ORAL | Status: DC

## 2011-06-08 MED ORDER — VANCOMYCIN HCL IN DEXTROSE 1-5 GM/200ML-% IV SOLN
1000.0000 mg | Freq: Once | INTRAVENOUS | Status: AC
  Administered 2011-06-08: 1000 mg via INTRAVENOUS
  Filled 2011-06-08: qty 200

## 2011-06-08 MED ORDER — SODIUM CHLORIDE 0.9 % IV BOLUS (SEPSIS)
1000.0000 mL | Freq: Once | INTRAVENOUS | Status: AC
  Administered 2011-06-08: 1000 mL via INTRAVENOUS
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
## 2011-06-08 NOTE — ED Notes (Signed)
Dr.Kohout made aware of the hives on pts right arm after medications. Dr. Dorette Grate to give patient another dose of dilaudid for pain. Will continue to monitor.

## 2011-06-08 NOTE — ED Notes (Signed)
Pt stated that he had surgery 1 month ago. Last Thursday a section by his rt upper calf opened and begin bleeding. He said by  Friday the area had scabbed over. However, on Saturday he stated that his right leg became red, warm to touch, and swollen. Pt has dried blood on right calf. Pulses are palpable in leg. [place] and swollen area has been marked. Measurements are 11cm width and 18.5cm in length. Pain is intermittent. Pain is 8 out of 10. Pt does not know if he has had a elevated temperature. No cardiac or respiratory distress. Will continue to monitor.

## 2011-06-08 NOTE — Discharge Instructions (Signed)
Cellulitis Cellulitis is an infection of the tissue under the skin. The infected area is usually red and tender. This is caused by germs. These germs enter the body through cuts or sores. This usually happens in the arms or lower legs. HOME CARE   Take your medicine as told. Finish it even if you start to feel better.   If the infection is on the arm or leg, keep it raised (elevated).   Use a warm cloth on the infected area several times a day.   See your doctor for a follow-up visit as told.  GET HELP RIGHT AWAY IF:   You are tired or confused.   You throw up (vomit).   You have watery poop (diarrhea).   You feel ill and have muscle aches.   You have a fever.  MAKE SURE YOU:   Understand these instructions.   Will watch your condition.   Will get help right away if you are not doing well or get worse.  Document Released: 06/17/2007 Document Revised: 12/18/2010 Document Reviewed: 11/30/2008 [employer] Patient Information 2012 [employer]. 

## 2011-06-08 NOTE — ED Notes (Signed)
Pt states, I had artificial veins put in my leg and now it is busted open and draining" right leg redness and bloody drainage to incision site, scabbing and red indurated area noted behind knee, small pustule to knee. Site wrapped in gauze and kerlix. Surgery was one month ago.  CMS intact. Associated with nausea.

## 2011-06-08 NOTE — ED Notes (Signed)
Leg elevated on pillow. Will continue to monitor.

## 2011-06-09 ENCOUNTER — Encounter (HOSPITAL_COMMUNITY): Payer: Self-pay | Admitting: Pharmacy Technician

## 2011-06-09 ENCOUNTER — Other Ambulatory Visit: Payer: Self-pay

## 2011-06-09 ENCOUNTER — Ambulatory Visit (INDEPENDENT_AMBULATORY_CARE_PROVIDER_SITE_OTHER): Payer: Medicaid Other | Admitting: Vascular Surgery

## 2011-06-09 ENCOUNTER — Ambulatory Visit: Payer: Medicaid Other | Admitting: Vascular Surgery

## 2011-06-09 ENCOUNTER — Encounter (HOSPITAL_COMMUNITY): Payer: Self-pay

## 2011-06-09 ENCOUNTER — Encounter: Payer: Self-pay | Admitting: Vascular Surgery

## 2011-06-09 VITALS — BP 135/74 | HR 83 | Temp 98.4°F | Resp 18 | Ht 73.0 in | Wt 190.4 lb

## 2011-06-09 DIAGNOSIS — I70219 Atherosclerosis of native arteries of extremities with intermittent claudication, unspecified extremity: Secondary | ICD-10-CM

## 2011-06-09 DIAGNOSIS — T8189XA Other complications of procedures, not elsewhere classified, initial encounter: Secondary | ICD-10-CM

## 2011-06-09 MED ORDER — CEFAZOLIN SODIUM 1-5 GM-% IV SOLN
1.0000 g | Freq: Once | INTRAVENOUS | Status: AC
  Administered 2011-06-10: 1 g via INTRAVENOUS
  Filled 2011-06-09: qty 50

## 2011-06-09 MED ORDER — SODIUM CHLORIDE 0.9 % IV SOLN: INTRAVENOUS | Status: DC
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
## 2011-06-10 ENCOUNTER — Encounter (HOSPITAL_COMMUNITY)
Admission: RE | Disposition: A | Discharge status: Home / Self Care | Payer: Self-pay | Source: Ambulatory Visit | Attending: Vascular Surgery

## 2011-06-10 ENCOUNTER — Inpatient Hospital Stay (HOSPITAL_COMMUNITY)
Admission: RE | Admit: 2011-06-10 | Discharge: 2011-06-11 | DRG: 264 | Disposition: A | Discharge status: Home / Self Care | Payer: Medicaid Other | Source: Ambulatory Visit | Attending: Vascular Surgery | Admitting: Vascular Surgery

## 2011-06-10 ENCOUNTER — Ambulatory Visit (HOSPITAL_COMMUNITY): Payer: Medicaid Other | Admitting: Certified Registered"

## 2011-06-10 ENCOUNTER — Encounter (HOSPITAL_COMMUNITY): Payer: Self-pay | Admitting: *Deleted

## 2011-06-10 ENCOUNTER — Encounter (HOSPITAL_COMMUNITY): Payer: Self-pay | Admitting: Certified Registered"

## 2011-06-10 ENCOUNTER — Ambulatory Visit (HOSPITAL_COMMUNITY): Payer: Medicaid Other

## 2011-06-10 DIAGNOSIS — Z79899 Other long term (current) drug therapy: Secondary | ICD-10-CM

## 2011-06-10 DIAGNOSIS — I70219 Atherosclerosis of native arteries of extremities with intermittent claudication, unspecified extremity: Secondary | ICD-10-CM

## 2011-06-10 DIAGNOSIS — Z7982 Long term (current) use of aspirin: Secondary | ICD-10-CM

## 2011-06-10 DIAGNOSIS — I739 Peripheral vascular disease, unspecified: Secondary | ICD-10-CM | POA: Diagnosis present

## 2011-06-10 DIAGNOSIS — E785 Hyperlipidemia, unspecified: Secondary | ICD-10-CM | POA: Diagnosis present

## 2011-06-10 DIAGNOSIS — Y838 Other surgical procedures as the cause of abnormal reaction of the patient, or of later complication, without mention of misadventure at the time of the procedure: Secondary | ICD-10-CM | POA: Diagnosis present

## 2011-06-10 DIAGNOSIS — F17200 Nicotine dependence, unspecified, uncomplicated: Secondary | ICD-10-CM | POA: Diagnosis present

## 2011-06-10 DIAGNOSIS — E119 Type 2 diabetes mellitus without complications: Secondary | ICD-10-CM | POA: Diagnosis present

## 2011-06-10 DIAGNOSIS — F411 Generalized anxiety disorder: Secondary | ICD-10-CM | POA: Diagnosis present

## 2011-06-10 DIAGNOSIS — T827XXA Infection and inflammatory reaction due to other cardiac and vascular devices, implants and grafts, initial encounter: Principal | ICD-10-CM | POA: Diagnosis present

## 2011-06-10 DIAGNOSIS — M129 Arthropathy, unspecified: Secondary | ICD-10-CM | POA: Diagnosis present

## 2011-06-10 DIAGNOSIS — M79605 Pain in left leg: Secondary | ICD-10-CM

## 2011-06-10 DIAGNOSIS — J449 Chronic obstructive pulmonary disease, unspecified: Secondary | ICD-10-CM | POA: Diagnosis present

## 2011-06-10 DIAGNOSIS — F3289 Other specified depressive episodes: Secondary | ICD-10-CM | POA: Diagnosis present

## 2011-06-10 DIAGNOSIS — I10 Essential (primary) hypertension: Secondary | ICD-10-CM | POA: Diagnosis present

## 2011-06-10 DIAGNOSIS — T8189XA Other complications of procedures, not elsewhere classified, initial encounter: Secondary | ICD-10-CM

## 2011-06-10 DIAGNOSIS — B1920 Unspecified viral hepatitis C without hepatic coma: Secondary | ICD-10-CM | POA: Diagnosis present

## 2011-06-10 DIAGNOSIS — J4489 Other specified chronic obstructive pulmonary disease: Secondary | ICD-10-CM | POA: Diagnosis present

## 2011-06-10 DIAGNOSIS — F329 Major depressive disorder, single episode, unspecified: Secondary | ICD-10-CM | POA: Diagnosis present

## 2011-06-10 DIAGNOSIS — Z8673 Personal history of transient ischemic attack (TIA), and cerebral infarction without residual deficits: Secondary | ICD-10-CM

## 2011-06-10 DIAGNOSIS — Z7902 Long term (current) use of antithrombotics/antiplatelets: Secondary | ICD-10-CM

## 2011-06-10 DIAGNOSIS — K219 Gastro-esophageal reflux disease without esophagitis: Secondary | ICD-10-CM | POA: Diagnosis present

## 2011-06-10 DIAGNOSIS — Z888 Allergy status to other drugs, medicaments and biological substances status: Secondary | ICD-10-CM

## 2011-06-10 HISTORY — DX: Contact with and (suspected) exposure to tuberculosis: Z20.1

## 2011-06-10 HISTORY — DX: Gastro-esophageal reflux disease without esophagitis: K21.9

## 2011-06-10 HISTORY — PX: I & D EXTREMITY: SHX5045

## 2011-06-10 HISTORY — DX: Chronic obstructive pulmonary disease, unspecified: J44.9

## 2011-06-10 HISTORY — DX: Sleep apnea, unspecified: G47.30

## 2011-06-10 LAB — POCT I-STAT 4, (NA,K, GLUC, HGB,HCT)
HCT: 41 % (ref 39.0–52.0)
Hemoglobin: 13.9 g/dL (ref 13.0–17.0)
Potassium: 4.4 mEq/L (ref 3.5–5.1)

## 2011-06-10 LAB — HEMOGLOBIN A1C
Hgb A1c MFr Bld: 6.1 % — ABNORMAL HIGH (ref <5.7)
Mean Plasma Glucose: 128 mg/dL — ABNORMAL HIGH (ref <117)

## 2011-06-10 LAB — GLUCOSE, CAPILLARY
Glucose-Capillary: 114 mg/dL — ABNORMAL HIGH (ref 70–99)
Glucose-Capillary: 101 mg/dL — ABNORMAL HIGH (ref 70–99)

## 2011-06-10 LAB — PROTIME-INR: INR: 1.02 (ref 0.00–1.49)

## 2011-06-10 LAB — APTT: aPTT: 33 seconds (ref 24–37)

## 2011-06-10 SURGERY — IRRIGATION AND DEBRIDEMENT EXTREMITY
Anesthesia: General | Site: Leg Lower | Laterality: Right

## 2011-06-10 MED ORDER — FENTANYL 10 MCG/ML IV SOLN
INTRAVENOUS | Status: DC
  Administered 2011-06-10: 75 ug via INTRAVENOUS
  Administered 2011-06-10: 18:00:00 via INTRAVENOUS
  Administered 2011-06-11: 135 ug via INTRAVENOUS
  Administered 2011-06-11: 165 ug via INTRAVENOUS
  Filled 2011-06-10 (×2): qty 50

## 2011-06-10 MED ORDER — ADULT MULTIVITAMIN W/MINERALS CH
1.0000 | ORAL_TABLET | Freq: Every day | ORAL | Status: DC
  Filled 2011-06-10: qty 1

## 2011-06-10 MED ORDER — METOPROLOL TARTRATE 1 MG/ML IV SOLN: 2.0000 mg | INTRAVENOUS | Status: DC | PRN

## 2011-06-10 MED ORDER — SODIUM CHLORIDE 0.9 % IV SOLN
INTRAVENOUS | Status: DC
  Administered 2011-06-10: 16:00:00 via INTRAVENOUS

## 2011-06-10 MED ORDER — OXYCODONE HCL 5 MG PO TABS
5.0000 mg | ORAL_TABLET | ORAL | Status: DC | PRN
  Administered 2011-06-10 (×2): 5 mg via ORAL
  Filled 2011-06-10 (×2): qty 1

## 2011-06-10 MED ORDER — PANTOPRAZOLE SODIUM 40 MG PO TBEC: 40.0000 mg | DELAYED_RELEASE_TABLET | Freq: Every day | ORAL | Status: DC

## 2011-06-10 MED ORDER — ONDANSETRON HCL 4 MG/2ML IJ SOLN: 4.0000 mg | Freq: Four times a day (QID) | INTRAMUSCULAR | Status: DC | PRN

## 2011-06-10 MED ORDER — ALBUTEROL SULFATE (5 MG/ML) 0.5% IN NEBU
2.5000 mg | INHALATION_SOLUTION | Freq: Once | RESPIRATORY_TRACT | Status: AC
  Administered 2011-06-10: 2.5 mg via RESPIRATORY_TRACT

## 2011-06-10 MED ORDER — ALBUTEROL SULFATE HFA 108 (90 BASE) MCG/ACT IN AERS
2.0000 | INHALATION_SPRAY | RESPIRATORY_TRACT | Status: DC | PRN
  Filled 2011-06-10: qty 6.7

## 2011-06-10 MED ORDER — PROPOFOL 10 MG/ML IV EMUL
INTRAVENOUS | Status: DC | PRN
  Administered 2011-06-10: 200 mg via INTRAVENOUS

## 2011-06-10 MED ORDER — PHENYLEPHRINE HCL 10 MG/ML IJ SOLN
INTRAMUSCULAR | Status: DC | PRN
  Administered 2011-06-10: 40 ug via INTRAVENOUS

## 2011-06-10 MED ORDER — PHENOL 1.4 % MT LIQD
1.0000 | OROMUCOSAL | Status: DC | PRN
  Filled 2011-06-10: qty 177

## 2011-06-10 MED ORDER — FENTANYL CITRATE 0.05 MG/ML IJ SOLN
INTRAMUSCULAR | Status: DC | PRN
  Administered 2011-06-10: 100 ug via INTRAVENOUS

## 2011-06-10 MED ORDER — LACTATED RINGERS IV SOLN
INTRAVENOUS | Status: DC | PRN
  Administered 2011-06-10: 14:00:00 via INTRAVENOUS

## 2011-06-10 MED ORDER — 0.9 % SODIUM CHLORIDE (POUR BTL) OPTIME
TOPICAL | Status: DC | PRN
  Administered 2011-06-10: 1000 mL

## 2011-06-10 MED ORDER — DROPERIDOL 2.5 MG/ML IJ SOLN: 0.6250 mg | INTRAMUSCULAR | Status: DC | PRN

## 2011-06-10 MED ORDER — FENOFIBRATE 54 MG PO TABS
54.0000 mg | ORAL_TABLET | Freq: Every day | ORAL | Status: DC
  Filled 2011-06-10: qty 1

## 2011-06-10 MED ORDER — GABAPENTIN 400 MG PO CAPS
800.0000 mg | ORAL_CAPSULE | Freq: Three times a day (TID) | ORAL | Status: DC
  Administered 2011-06-10: 800 mg via ORAL
  Filled 2011-06-10 (×4): qty 2

## 2011-06-10 MED ORDER — SODIUM CHLORIDE 0.9 % IJ SOLN: 9.0000 mL | INTRAMUSCULAR | Status: DC | PRN

## 2011-06-10 MED ORDER — GABAPENTIN 800 MG PO TABS
800.0000 mg | ORAL_TABLET | Freq: Three times a day (TID) | ORAL | Status: DC
  Filled 2011-06-10 (×4): qty 1

## 2011-06-10 MED ORDER — DIPHENHYDRAMINE HCL 50 MG/ML IJ SOLN: 12.5000 mg | Freq: Four times a day (QID) | INTRAMUSCULAR | Status: DC | PRN

## 2011-06-10 MED ORDER — HYDRALAZINE HCL 20 MG/ML IJ SOLN
10.0000 mg | INTRAMUSCULAR | Status: DC | PRN
  Filled 2011-06-10: qty 0.5

## 2011-06-10 MED ORDER — GUAIFENESIN-DM 100-10 MG/5ML PO SYRP: 15.0000 mL | ORAL_SOLUTION | ORAL | Status: DC | PRN

## 2011-06-10 MED ORDER — ACETAMINOPHEN 325 MG PO TABS: 325.0000 mg | ORAL_TABLET | ORAL | Status: DC | PRN

## 2011-06-10 MED ORDER — INSULIN ASPART 100 UNIT/ML ~~LOC~~ SOLN
0.0000 [IU] | Freq: Three times a day (TID) | SUBCUTANEOUS | Status: DC
  Administered 2011-06-11: 3 [IU] via SUBCUTANEOUS

## 2011-06-10 MED ORDER — NALOXONE HCL 0.4 MG/ML IJ SOLN: 0.4000 mg | INTRAMUSCULAR | Status: DC | PRN

## 2011-06-10 MED ORDER — MIDAZOLAM HCL 5 MG/5ML IJ SOLN
INTRAMUSCULAR | Status: DC | PRN
  Administered 2011-06-10: 2 mg via INTRAVENOUS

## 2011-06-10 MED ORDER — HYDROMORPHONE HCL PF 1 MG/ML IJ SOLN
INTRAMUSCULAR | Status: AC
  Administered 2011-06-10: 0.5 mg via INTRAVENOUS
  Filled 2011-06-10: qty 1

## 2011-06-10 MED ORDER — CLOPIDOGREL BISULFATE 75 MG PO TABS
75.0000 mg | ORAL_TABLET | Freq: Every day | ORAL | Status: DC
  Administered 2011-06-11: 75 mg via ORAL
  Filled 2011-06-10 (×2): qty 1

## 2011-06-10 MED ORDER — OXYCODONE-ACETAMINOPHEN 5-325 MG PO TABS
ORAL_TABLET | ORAL | Status: AC
  Administered 2011-06-10: 2 via ORAL
  Filled 2011-06-10: qty 2

## 2011-06-10 MED ORDER — OXYCODONE-ACETAMINOPHEN 10-325 MG PO TABS: 1.0000 | ORAL_TABLET | ORAL | Status: DC | PRN

## 2011-06-10 MED ORDER — METOPROLOL TARTRATE 50 MG PO TABS
50.0000 mg | ORAL_TABLET | Freq: Once | ORAL | Status: AC
  Administered 2011-06-10: 50 mg via ORAL
  Filled 2011-06-10 (×2): qty 1

## 2011-06-10 MED ORDER — ACETAMINOPHEN 650 MG RE SUPP: 325.0000 mg | RECTAL | Status: DC | PRN

## 2011-06-10 MED ORDER — POTASSIUM CHLORIDE CRYS ER 20 MEQ PO TBCR: 20.0000 meq | EXTENDED_RELEASE_TABLET | Freq: Once | ORAL | Status: DC

## 2011-06-10 MED ORDER — ALUM & MAG HYDROXIDE-SIMETH 200-200-20 MG/5ML PO SUSP: 15.0000 mL | ORAL | Status: DC | PRN

## 2011-06-10 MED ORDER — ASPIRIN 81 MG PO TBEC: 81.0000 mg | DELAYED_RELEASE_TABLET | Freq: Every day | ORAL | Status: DC

## 2011-06-10 MED ORDER — LABETALOL HCL 5 MG/ML IV SOLN
10.0000 mg | INTRAVENOUS | Status: DC | PRN
  Filled 2011-06-10: qty 4

## 2011-06-10 MED ORDER — HYDROMORPHONE HCL PF 1 MG/ML IJ SOLN
INTRAMUSCULAR | Status: AC
  Filled 2011-06-10: qty 2

## 2011-06-10 MED ORDER — MUPIROCIN 2 % EX OINT
TOPICAL_OINTMENT | CUTANEOUS | Status: AC
  Administered 2011-06-10: 1 via NASAL
  Filled 2011-06-10: qty 22

## 2011-06-10 MED ORDER — METFORMIN HCL 500 MG PO TABS
500.0000 mg | ORAL_TABLET | Freq: Two times a day (BID) | ORAL | Status: DC
  Administered 2011-06-11: 500 mg via ORAL
  Filled 2011-06-10 (×3): qty 1

## 2011-06-10 MED ORDER — PANTOPRAZOLE SODIUM 40 MG PO TBEC: 40.0000 mg | DELAYED_RELEASE_TABLET | Freq: Every day | ORAL | Status: DC | PRN

## 2011-06-10 MED ORDER — ASPIRIN EC 81 MG PO TBEC
81.0000 mg | DELAYED_RELEASE_TABLET | Freq: Every day | ORAL | Status: DC
Start: 2011-06-11 — End: 2011-06-11
  Filled 2011-06-10: qty 1

## 2011-06-10 MED ORDER — DIPHENHYDRAMINE HCL 12.5 MG/5ML PO ELIX
12.5000 mg | ORAL_SOLUTION | Freq: Four times a day (QID) | ORAL | Status: DC | PRN
  Filled 2011-06-10: qty 5

## 2011-06-10 MED ORDER — ATORVASTATIN CALCIUM 40 MG PO TABS
40.0000 mg | ORAL_TABLET | Freq: Every day | ORAL | Status: DC
  Administered 2011-06-10: 40 mg via ORAL
  Filled 2011-06-10 (×2): qty 1

## 2011-06-10 MED ORDER — OXYCODONE-ACETAMINOPHEN 5-325 MG PO TABS
1.0000 | ORAL_TABLET | ORAL | Status: DC | PRN
  Administered 2011-06-10 (×2): 1 via ORAL
  Administered 2011-06-11: 2 via ORAL
  Filled 2011-06-10: qty 1
  Filled 2011-06-10: qty 2
  Filled 2011-06-10: qty 1

## 2011-06-10 MED ORDER — HYDROXYZINE HCL 25 MG PO TABS
25.0000 mg | ORAL_TABLET | Freq: Two times a day (BID) | ORAL | Status: DC | PRN
  Filled 2011-06-10: qty 1

## 2011-06-10 MED ORDER — LISINOPRIL 5 MG PO TABS
5.0000 mg | ORAL_TABLET | Freq: Every day | ORAL | Status: DC
  Filled 2011-06-10: qty 1

## 2011-06-10 MED ORDER — EPHEDRINE SULFATE 50 MG/ML IJ SOLN
INTRAMUSCULAR | Status: DC | PRN
  Administered 2011-06-10: 5 mg via INTRAVENOUS
  Administered 2011-06-10: 10 mg via INTRAVENOUS

## 2011-06-10 MED ORDER — ATENOLOL 50 MG PO TABS
50.0000 mg | ORAL_TABLET | Freq: Every day | ORAL | Status: DC
  Filled 2011-06-10: qty 1

## 2011-06-10 MED ORDER — OXYCODONE-ACETAMINOPHEN 5-325 MG PO TABS: 2.0000 | ORAL_TABLET | ORAL | Status: DC

## 2011-06-10 MED ORDER — FAMOTIDINE 10 MG PO TABS
10.0000 mg | ORAL_TABLET | Freq: Every day | ORAL | Status: DC
  Filled 2011-06-10: qty 1

## 2011-06-10 MED ORDER — HYDROMORPHONE HCL PF 1 MG/ML IJ SOLN
0.2500 mg | INTRAMUSCULAR | Status: DC | PRN
  Administered 2011-06-10 (×2): 0.5 mg via INTRAVENOUS

## 2011-06-10 SURGICAL SUPPLY — 36 items
BANDAGE ELASTIC 4 VELCRO ST LF (GAUZE/BANDAGES/DRESSINGS) IMPLANT
BANDAGE ELASTIC 6 VELCRO ST LF (GAUZE/BANDAGES/DRESSINGS) IMPLANT
BANDAGE GAUZE ELAST BULKY 4 IN (GAUZE/BANDAGES/DRESSINGS) ×1 IMPLANT
CANISTER SUCTION 2500CC (MISCELLANEOUS) ×2 IMPLANT
CLIP LIGATING EXTRA MED SLVR (CLIP) ×1 IMPLANT
CLIP LIGATING EXTRA SM BLUE (MISCELLANEOUS) ×1 IMPLANT
CLOTH BEACON ORANGE TIMEOUT ST (SAFETY) ×2 IMPLANT
COVER SURGICAL LIGHT HANDLE (MISCELLANEOUS) ×4 IMPLANT
DRAPE U-SHAPE 47X51 STRL (DRAPES) ×1 IMPLANT
DRAPE U-SHAPE 76X120 STRL (DRAPES) IMPLANT
DRSG PAD ABDOMINAL 8X10 ST (GAUZE/BANDAGES/DRESSINGS) ×1 IMPLANT
ELECT REM PT RETURN 9FT ADLT (ELECTROSURGICAL) ×2
ELECTRODE REM PT RTRN 9FT ADLT (ELECTROSURGICAL) ×1 IMPLANT
GLOVE BIO SURGEON STRL SZ 6.5 (GLOVE) ×1 IMPLANT
GLOVE BIOGEL PI IND STRL 6.5 (GLOVE) IMPLANT
GLOVE BIOGEL PI IND STRL 7.0 (GLOVE) IMPLANT
GLOVE BIOGEL PI INDICATOR 6.5 (GLOVE) ×1
GLOVE BIOGEL PI INDICATOR 7.0 (GLOVE) ×1
GLOVE SS BIOGEL STRL SZ 7.5 (GLOVE) ×1 IMPLANT
GLOVE SUPERSENSE BIOGEL SZ 7.5 (GLOVE) ×1
GOWN STRL NON-REIN LRG LVL3 (GOWN DISPOSABLE) ×4 IMPLANT
KIT BASIN OR (CUSTOM PROCEDURE TRAY) ×2 IMPLANT
KIT ROOM TURNOVER OR (KITS) ×2 IMPLANT
NS IRRIG 1000ML POUR BTL (IV SOLUTION) ×2 IMPLANT
PACK GENERAL/GYN (CUSTOM PROCEDURE TRAY) ×2 IMPLANT
PACK UNIVERSAL I (CUSTOM PROCEDURE TRAY) ×1 IMPLANT
PAD ARMBOARD 7.5X6 YLW CONV (MISCELLANEOUS) ×4 IMPLANT
SPONGE GAUZE 4X4 12PLY (GAUZE/BANDAGES/DRESSINGS) ×2 IMPLANT
STAPLER VISISTAT 35W (STAPLE) IMPLANT
SUT ETHILON 3 0 PS 1 (SUTURE) IMPLANT
SUT VIC AB 2-0 CTX 36 (SUTURE) IMPLANT
SUT VIC AB 3-0 SH 27 (SUTURE)
SUT VIC AB 3-0 SH 27X BRD (SUTURE) IMPLANT
TOWEL OR 17X24 6PK STRL BLUE (TOWEL DISPOSABLE) ×2 IMPLANT
TOWEL OR 17X26 10 PK STRL BLUE (TOWEL DISPOSABLE) ×2 IMPLANT
WATER STERILE IRR 1000ML POUR (IV SOLUTION) ×2 IMPLANT

## 2011-06-10 NOTE — Transfer of Care (Signed)
Immediate Anesthesia Transfer of Care Note  Patient: [name]  Procedure(s) Performed: Procedure(s) (LRB): IRRIGATION AND DEBRIDEMENT EXTREMITY (Right)  Patient Location: PACU  Anesthesia Type: General  Level of Consciousness: awake and alert   Airway & Oxygen Therapy: Patient Spontanous Breathing  Post-op Assessment: Report given to PACU RN  Post vital signs: stable  Complications: No apparent anesthesia complications

## 2011-06-10 NOTE — Interval H&P Note (Signed)
History and Physical Interval Note:  06/10/2011 1:35 PM  [name]  has presented today for surgery, with the diagnosis of wound infection right calf  The various methods of treatment have been discussed with the patient and family. After consideration of risks, benefits and other options for treatment, the patient has consented to  Procedure(s) (LRB): IRRIGATION AND DEBRIDEMENT EXTREMITY (Right) as a surgical intervention .  The patients' history has been reviewed, patient examined, no change in status, stable for surgery.  I have reviewed the patients' chart and labs.  Questions were answered to the patient's satisfaction.     [name]

## 2011-06-10 NOTE — Anesthesia Postprocedure Evaluation (Signed)
Anesthesia Post Note  Patient: [name]  Procedure(s) Performed: Procedure(s) (LRB): IRRIGATION AND DEBRIDEMENT EXTREMITY (Right)  Anesthesia type: general  Patient location: PACU  Post pain: Pain level controlled  Post assessment: Patient's Cardiovascular Status Stable  Last Vitals:  Filed Vitals:   06/10/11 1530  BP: 124/64  Pulse: 73  Temp:   Resp: 12    Post vital signs: Reviewed and stable  Level of consciousness: sedated  Complications: No apparent anesthesia complications

## 2011-06-10 NOTE — Anesthesia Preprocedure Evaluation (Addendum)
Anesthesia Evaluation  Patient identified by MRN, date of birth, ID band Patient awake    Reviewed: Allergy & Precautions, H&P , NPO status , Patient's Chart, lab work & pertinent test results, reviewed documented beta blocker date and time   History of Anesthesia Complications Negative for: history of anesthetic complications  Airway Mallampati: II TM Distance: >3 FB Neck ROM: Full    Dental  (+) Poor Dentition and Dental Advisory Given   Pulmonary shortness of breath and with exertion, sleep apnea , COPD COPD inhaler,  breath sounds clear to auscultation  Pulmonary exam normal       Cardiovascular hypertension, Pt. on home beta blockers Rhythm:Regular Rate:Normal     Neuro/Psych Anxiety Depression  Neuromuscular disease CVA, No Residual Symptoms    GI/Hepatic hiatal hernia, GERD-  Medicated,(+) Hepatitis -, C  Endo/Other  Diabetes mellitus-, Poorly Controlled, Type 2  Renal/GU negative Renal ROS     Musculoskeletal   Abdominal   Peds  Hematology   Anesthesia Other Findings   Reproductive/Obstetrics                          Anesthesia Physical Anesthesia Plan  ASA: III  Anesthesia Plan: General   Post-op Pain Management:    Induction: Intravenous  Airway Management Planned: Oral ETT  Additional Equipment:   Intra-op Plan:   Post-operative Plan: Extubation in OR  Informed Consent: I have reviewed the patients History and Physical, chart, labs and discussed the procedure including the risks, benefits and alternatives for the proposed anesthesia with the patient or authorized representative who has indicated his/her understanding and acceptance.   Dental advisory given  Plan Discussed with: CRNA, Anesthesiologist and Surgeon  Anesthesia Plan Comments:         Anesthesia Quick Evaluation
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
## 2011-06-10 NOTE — OR Nursing (Signed)
Patient states that Betadine is ok for prep per Vista Mink RN.

## 2011-06-10 NOTE — Op Note (Signed)
OPERATIVE REPORT  DATE OF SURGERY: 06/10/2011  PATIENT: [name], 51 y.o. male MRN: [id number]  DOB: 1960-03-01  PRE-OPERATIVE DIAGNOSIS: Right infection 2 popliteal wound  POST-OPERATIVE DIAGNOSIS:  Same  PROCEDURE: Incision and drainage and of right below-knee popliteal wounddebridement  SURGEON:  Gretta Began, M.D.  Assistant nurse   ANESTHESIA:  LMA  EBL:  minimal ml  Total I/O In: 800 [I.V.:800] Out: -   BLOOD ADMINISTERED:  none  DRAINS:  none  SPECIMEN:  wound culture   COUNTS CORRECT:  YES  PLAN OF CARE:  PACU    PATIENT DISPOSITION:  PACU - hemodynamically stable  PROCEDURE DETAILS: the patient is several weeks status post right femoral to below-knee popliteal bypass. He presented to the emergency department 2 days ago to the emergency department with erythema and the popliteal wound. He saw me in the office yesterday. He did have drainage from the wound is extremely tender. I recommended he undergo debridement of this area was some concern that he may continue down to the prostatic femoral to below-knee popliteal graft.    procedure in detail: The patient was taken to the operating room where the area of the right groin and right leg were prepped and draped in the usual sterile fashion. The popliteal incision was opened and there was a stitch abscess from the Vicryl suture used to close the popliteal skin. The skin itself was debrided back to normal tissue over the length of the popliteal incision. There was no evidence of deep infection. This did not continued below the level of the fascia which was closed and intact. All the necrotic tissue was debrided. It was cultured and culture was sent. The wound is irrigated with saline and hemostasis was obtained with electrocautery. The wound was packed with Betadine soaked 4 x 4's and he was transferred to the recovery room in stable condition   Gretta Began, M.D. 06/10/2011 3:05 PM

## 2011-06-10 NOTE — Progress Notes (Signed)
Pt c/o pain rated 9 and throbbing in right leg. Call to pharmacy , recommended giving oxycodone 5-325mg  2 tabs to replace the patieint's usual dose of 10-325. Dr. Jesusita Oka singer called to request 5-325mg  2 tabs to replace the 10-325mg  tab for pain - okay to give per Dr. Krista Blue.

## 2011-06-10 NOTE — Anesthesia Procedure Notes (Signed)
Procedure Name: LMA Insertion Date/Time: 06/10/2011 2:20 PM Performed by: Ellin Goodie Pre-anesthesia Checklist: Patient identified, Emergency Drugs available, Suction available, Patient being monitored and Timeout performed Patient Re-evaluated:Patient Re-evaluated prior to inductionOxygen Delivery Method: Circle system utilized Preoxygenation: Pre-oxygenation with 100% oxygen Intubation Type: IV induction Ventilation: Mask ventilation without difficulty LMA: LMA inserted LMA Size: 5.0 Number of attempts: 1 Tube secured with: Tape Dental Injury: Teeth and Oropharynx as per pre-operative assessment

## 2011-06-11 ENCOUNTER — Telehealth: Payer: Self-pay | Admitting: Vascular Surgery

## 2011-06-11 ENCOUNTER — Encounter (HOSPITAL_COMMUNITY): Payer: Self-pay | Admitting: Vascular Surgery

## 2011-06-11 LAB — GLUCOSE, CAPILLARY: Glucose-Capillary: 169 mg/dL — ABNORMAL HIGH (ref 70–99)

## 2011-06-11 LAB — PROTIME-INR: INR: 1.05 (ref 0.00–1.49)

## 2011-06-11 LAB — CBC
Platelets: 188 10*3/uL (ref 150–400)
RDW: 13.3 % (ref 11.5–15.5)
WBC: 6.6 10*3/uL (ref 4.0–10.5)

## 2011-06-11 LAB — COMPREHENSIVE METABOLIC PANEL
AST: 13 U/L (ref 0–37)
Albumin: 3.2 g/dL — ABNORMAL LOW (ref 3.5–5.2)
Alkaline Phosphatase: 78 U/L (ref 39–117)
Chloride: 101 mEq/L (ref 96–112)
Potassium: 4.5 mEq/L (ref 3.5–5.1)
Sodium: 136 mEq/L (ref 135–145)
Total Bilirubin: 0.2 mg/dL — ABNORMAL LOW (ref 0.3–1.2)
Total Protein: 6.6 g/dL (ref 6.0–8.3)

## 2011-06-11 MED ORDER — OXYCODONE-ACETAMINOPHEN 10-325 MG PO TABS: 1.0000 | ORAL_TABLET | ORAL | Status: DC | PRN

## 2011-06-11 NOTE — Progress Notes (Signed)
Subjective: Interval History: none..   Objective: Vital signs in last 24 hours: Temp:  [96.7 F (35.9 C)-98.6 F (37 C)] 98.6 F (37 C) (05/30 0439) Pulse Rate:  [67-81] 71  (05/30 0439) Resp:  [12-20] 16  (05/30 0439) BP: (105-124)/(58-79) 115/62 mmHg (05/30 0439) SpO2:  [90 %-100 %] 98 % (05/30 0439) Weight:  [190 lb (86.183 kg)-192 lb 0.3 oz (87.1 kg)] 192 lb 0.3 oz (87.1 kg) (05/29 1604)  Intake/Output from previous day: 05/29 0701 - 05/30 0700 In: 2653.3 [I.V.:2653.3] Out: 1400 [Urine:1400] Intake/Output this shift: Total I/O In: 1853.3 [I.V.:1853.3] Out: 1400 [Urine:1400]  Extremities: Well perfused, dressing intact  Lab Results:  Basename 06/11/11 0621 06/10/11 0942  WBC 6.6 --  HGB 11.7* 13.9  HCT 35.5* 41.0  PLT 188 --   BMET  Basename 06/10/11 0942  NA 138  K 4.4  CL --  CO2 --  GLUCOSE 142*  BUN --  CREATININE --  CALCIUM --    Studies/Results: Dg Chest 2 View  06/10/2011  *RADIOLOGY REPORT*  Clinical Data: 51 year old male preoperative study for right lower extremity debridement.  Hypertension and diabetes.  CHEST - 2 VIEW  Comparison: 05/04/2011 and earlier.  Findings: Chronically large lung volumes are stable.  Cardiac size and mediastinal contours are within normal limits.  Visualized tracheal air column is within normal limits.  No pneumothorax, pulmonary edema, pleural effusion or consolidation.  Screen artifact versus increased small calcified granulomas in the right upper lobe.  Multilevel chronic left rib fractures.  Stable mild upper thoracic compression deformity.  Partial visualization of ACDF hardware.  IMPRESSION: No acute cardiopulmonary abnormality.  Original Report Authenticated By: Harley Hallmark, M.D.   Dg Tibia/fibula Right  06/08/2011  *RADIOLOGY REPORT*  Clinical Data: Wound infection right tib-fib.  Swelling around the knee and mid tib-fib.  RIGHT TIBIA AND FIBULA - 2 VIEW  Comparison: None.  Findings: Surgical clips overlie the  medial aspect of the tibia and fibula.  There is soft tissue swelling without evidence for soft tissue gas or radiopaque foreign body.  No evidence for fracture or subluxation. Degenerative changes are seen in the patellofemoral joint.  IMPRESSION:  1.  Soft tissue swelling. 2.  No evidence for acute osseous abnormality.  Original Report Authenticated By: Patterson Hammersmith, M.D.   Anti-infectives: Anti-infectives     Start     Dose/Rate Route Frequency Ordered Stop   06/09/11 1445   ceFAZolin (ANCEF) IVPB 1 g/50 mL premix        1 g 100 mL/hr over 30 Minutes Intravenous  Once 06/09/11 1430 06/10/11 1422          Assessment/Plan: s/p Procedure(s) (LRB): IRRIGATION AND DEBRIDEMENT EXTREMITY (Right) Plan dc home today with bid saline wet to dry dressing changes to popliteal wd.  Has oral antibiotics.  Will need oxycodone 10/325 #40 for pain   LOS: 1 day   Westlynn Fifer 06/11/2011, 6:48 AM
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
## 2011-06-11 NOTE — Telephone Encounter (Signed)
Called patient to confirm 2 week follow-up appointment on 06/23/11, he said he would be unable to make that appointment due to a court date for disability. I scheduled the next week out and told the patient to call if he has any issues before then.

## 2011-06-11 NOTE — Progress Notes (Signed)
UR Completed.  Shari Natt Jane [phone] 06/11/2011  

## 2011-06-11 NOTE — Telephone Encounter (Signed)
Message copied by Sara Chu on Thu Jun 11, 2011 11:59 AM ------      Message from: [employer] [place]      Created: Thu Jun 11, 2011 10:59 AM      Regarding: schedule                   ----- Message -----         From: Dara Lords, PA         Sent: 06/11/2011  10:03 AM           To: Sharee Pimple, CMA            Peloso,[place] RMale, 51 y.o., [birth year]Location: None             S/p I&D of pop wound yest by Early.      Needs to f/u with him in 2 weeks.            Thanks,      Lelon Mast

## 2011-06-12 ENCOUNTER — Other Ambulatory Visit: Payer: Self-pay | Admitting: Internal Medicine

## 2011-06-13 LAB — WOUND CULTURE

## 2011-06-14 ENCOUNTER — Other Ambulatory Visit: Payer: Self-pay | Admitting: Internal Medicine

## 2011-06-15 LAB — ANAEROBIC CULTURE

## 2011-06-16 ENCOUNTER — Telehealth: Payer: Self-pay

## 2011-06-16 DIAGNOSIS — G8918 Other acute postprocedural pain: Secondary | ICD-10-CM

## 2011-06-16 MED ORDER — OXYCODONE-ACETAMINOPHEN 5-325 MG PO TABS: ORAL_TABLET | ORAL | Status: DC

## 2011-06-16 NOTE — Telephone Encounter (Signed)
Phone call from pt. to request refill on Percocet 10/325 mg. States taking 2 tabs q 4 hrs.  States pain level is at 3-4/10 on pain medication, and increases to 8-9/10 when pain medication wears-off.  Denies any redness in peri-wound area.  States the drainage has decreased in amt., and describes as "yellow".    Discussed w/ Dr. Arbie Cookey.  Rec'd verbal order to give a one-time RX for Percocet 5/325mg , take 1-2 tabs q 4-6 hrs.prn for pain, # 30/ no refills.  Pt. notified of RX.  Instructed to pick up RX at office.

## 2011-06-17 ENCOUNTER — Ambulatory Visit: Payer: Medicaid Other | Admitting: Internal Medicine

## 2011-06-17 ENCOUNTER — Encounter: Payer: Medicaid Other | Admitting: Internal Medicine

## 2011-06-23 ENCOUNTER — Ambulatory Visit: Payer: Medicaid Other | Admitting: Vascular Surgery

## 2011-06-29 ENCOUNTER — Encounter: Payer: Self-pay | Admitting: Vascular Surgery

## 2011-06-30 ENCOUNTER — Encounter: Payer: Self-pay | Admitting: Vascular Surgery

## 2011-06-30 ENCOUNTER — Ambulatory Visit (INDEPENDENT_AMBULATORY_CARE_PROVIDER_SITE_OTHER): Payer: Medicaid Other | Admitting: Vascular Surgery

## 2011-06-30 VITALS — BP 131/69 | HR 70 | Resp 18 | Ht 73.0 in | Wt 187.7 lb

## 2011-06-30 DIAGNOSIS — I70219 Atherosclerosis of native arteries of extremities with intermittent claudication, unspecified extremity: Secondary | ICD-10-CM

## 2011-06-30 NOTE — Progress Notes (Signed)
The patient has today for followup of incision and drainage of a right below-knee popliteal wound on 06/10/2011. He had occlusion of the vein femoropopliteal bypass underwent ascetic replacement on 05/04/2011. He presented with the below knee popliteal wound incision. He was taken up reinforcing this was superficial. He was discharged home the next day with wound care.  Physical exam the incision has a granulation tissue base with no evidence of surrounding erythema. He does have a well-perfused foot and is walking without difficulty. He does have pain around the incision. Impression and plan: Status post right femoral-popliteal bypass with prosthetic Gore-Tex graft. His wound is healing quite nicely. He will continue local wound care. He is requesting narcotic pain medication. He does have a long history of pain control issues. He was given a prescription for Percocet 5/325 #40 and was told this would be his last prescription. He is to reestablish with his chronic pain management clinic

## 2011-07-06 DIAGNOSIS — M961 Postlaminectomy syndrome, not elsewhere classified: Secondary | ICD-10-CM | POA: Insufficient documentation

## 2011-07-10 ENCOUNTER — Other Ambulatory Visit: Payer: Self-pay | Admitting: Dietician

## 2011-07-10 DIAGNOSIS — E119 Type 2 diabetes mellitus without complications: Secondary | ICD-10-CM

## 2011-07-10 MED ORDER — ACCU-CHEK FASTCLIX LANCETS MISC: 1.0000 | Freq: Once | Status: DC

## 2011-07-10 MED ORDER — GLUCOSE BLOOD VI STRP: ORAL_STRIP | Status: DC

## 2011-07-10 MED ORDER — ACCU-CHEK NANO SMARTVIEW W/DEVICE KIT: 1.0000 | PACK | Freq: Once | Status: DC

## 2011-07-10 NOTE — Telephone Encounter (Signed)
Note says patient lost meter and is requesting another with prescription for strips and lancets. If [employer] does not pay for a replacement meter, patient can get coupon from this CDE for a free meter

## 2011-07-15 ENCOUNTER — Telehealth: Payer: Self-pay | Admitting: *Deleted

## 2011-07-15 NOTE — Telephone Encounter (Signed)
Pt called stating he has been to the pain clinic we sent him to in [place] and they put him on lyrica and it is not working. He was told the that clinic will not prescribe narcotics.  He has tried non-narcotic "stuff" He is getting where he can't walk because of the pain.  He needs a pain clinic that will put him on the meds he used to have  Pt # [phone] Please advise

## 2011-07-15 NOTE — Telephone Encounter (Signed)
I called the patient today to discuss the issue.  I only got his voicemail, and didn't want to give too much health info over the phone (in case it's a shared phone/answering machine), so I left him a message letting him know I'd call him back on Monday, after the holiday.  The short answer is that chronic pain isn't going to disappear overnight.  My best advice is to continue to go to the [facility] pain clinic, report that the lyrica is not working, and try other pain management modalities through them.  Switching pain clinics will be counterproductive, as he'll likely end up repeating previously-tried treatment courses, as the clinic try different medications and strategies to deal with the pain.  We will not prescribe narcotics at this time.  I'll keep this message active in my inbox, and call the patient on Monday

## 2011-07-20 NOTE — Telephone Encounter (Signed)
Called again today, still no answer, just generic voicemail.  I wonder if the patient's phone number has changed.  If the patient calls back, please reference my prior response from 07/15/11.

## 2011-07-27 ENCOUNTER — Telehealth: Payer: Self-pay | Admitting: *Deleted

## 2011-07-27 NOTE — Telephone Encounter (Signed)
Call from pt stating he wants [place] to set up for amputation of left leg, below knee. He would like [place] to arrange this. He has been to several pain clinics and none will give him the meds to stop the pain he is having in his leg.  He states he will not live with the pain he is having in his leg so wants surgery.  Pt # [phone]

## 2011-07-27 NOTE — Telephone Encounter (Signed)
Pt has scheduled appointment with Dr Manson Passey PCP on 8/7. I called pt and asked him to  Get records from pain clinics and bring to this appointment.  He will get the records and bring them.

## 2011-07-27 NOTE — Telephone Encounter (Signed)
Pls make appt with Dr Dorise Hiss (PCP) in Aug. She has plenty of appts as she is [facility] MD. Pls ask pt to get [place] records from last three pain clinics so that we can review.

## 2011-07-30 IMAGING — CR DG CHEST 2V
2 series · 2 of 2 positions shown · non-contrast
Comparison: 07/26/2008

CLINICAL DATA: Cough for 5-6 months

CHEST - 2 VIEW

[w chest pa]
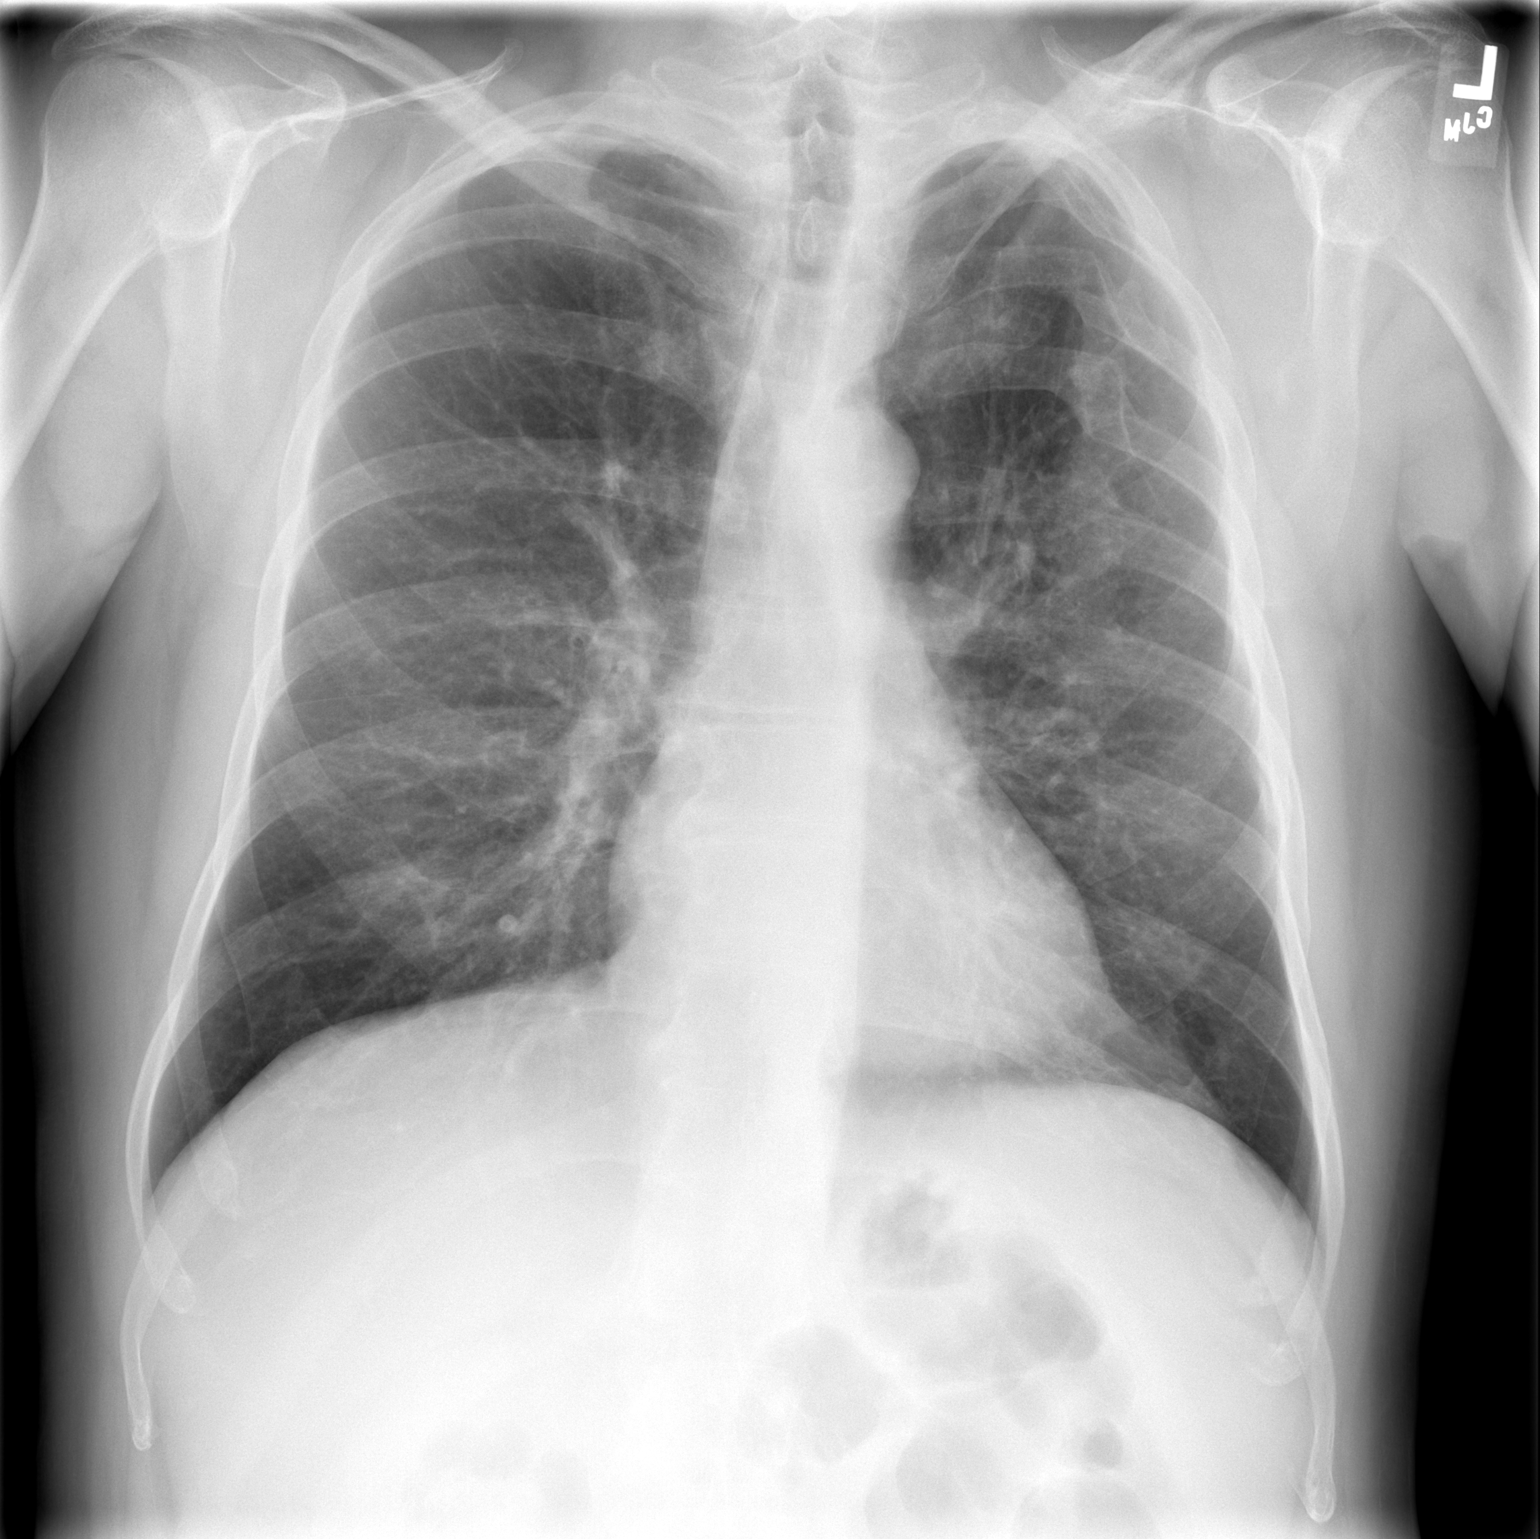

[w chest lat]
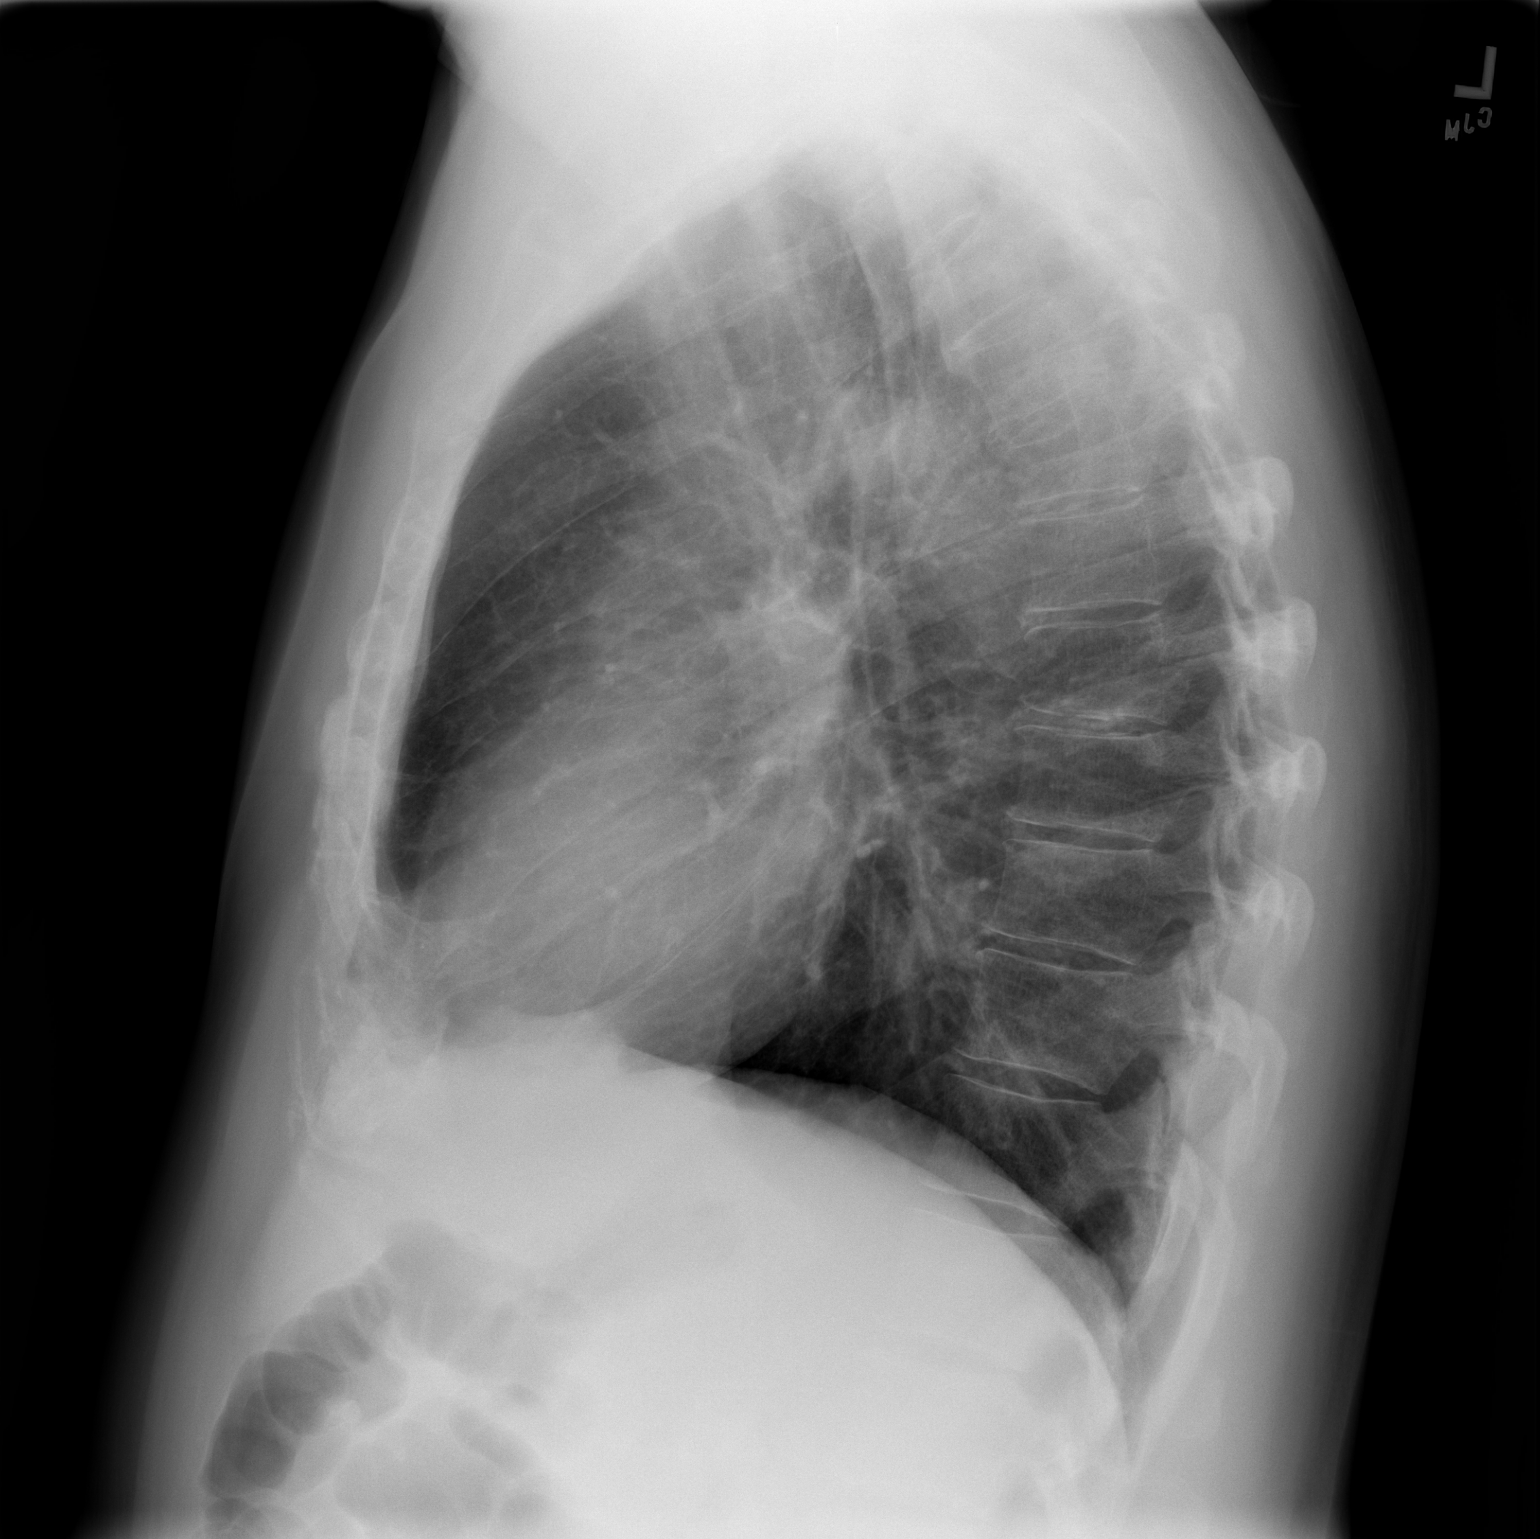

[2 of 2 positions shown; findings below may reference images not displayed]

FINDINGS: Heart size appears normal.

There is no pleural effusion or pulmonary edema identified.

There are chronic fracture deformities involving the left third,
fourth, fifth, and sixth ribs.

There is a chronic fracture deformity involving the left clavicle.

No acute bony abnormalities noted.
IMPRESSION: 1.  No active cardiopulmonary disease.
2.  Chronic fracture deformities involving the left clavicle and
left upper posterior ribs.

## 2011-08-01 ENCOUNTER — Encounter (HOSPITAL_COMMUNITY): Payer: Self-pay | Admitting: Emergency Medicine

## 2011-08-01 ENCOUNTER — Emergency Department (INDEPENDENT_AMBULATORY_CARE_PROVIDER_SITE_OTHER)
Admission: EM | Admit: 2011-08-01 | Discharge: 2011-08-01 | Disposition: A | Discharge status: Home / Self Care | Payer: Medicaid Other | Source: Home / Self Care

## 2011-08-01 DIAGNOSIS — L0390 Cellulitis, unspecified: Secondary | ICD-10-CM

## 2011-08-01 DIAGNOSIS — L0291 Cutaneous abscess, unspecified: Secondary | ICD-10-CM

## 2011-08-01 MED ORDER — HYDROCODONE-ACETAMINOPHEN 5-500 MG PO TABS: 1.0000 | ORAL_TABLET | Freq: Four times a day (QID) | ORAL | Status: AC | PRN

## 2011-08-01 MED ORDER — LIDOCAINE HCL (PF) 1 % IJ SOLN
5.0000 mL | Freq: Once | INTRAMUSCULAR | Status: AC
  Administered 2011-08-01: 5 mL
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
## 2011-08-01 NOTE — ED Notes (Signed)
Lidocaine injected by physician.
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
## 2011-08-05 ENCOUNTER — Encounter (HOSPITAL_COMMUNITY): Payer: Self-pay

## 2011-08-05 ENCOUNTER — Emergency Department (INDEPENDENT_AMBULATORY_CARE_PROVIDER_SITE_OTHER)
Admission: EM | Admit: 2011-08-05 | Discharge: 2011-08-05 | Disposition: A | Discharge status: Home Health | Payer: Medicaid Other | Source: Home / Self Care

## 2011-08-05 DIAGNOSIS — L0231 Cutaneous abscess of buttock: Secondary | ICD-10-CM

## 2011-08-05 DIAGNOSIS — L03317 Cellulitis of buttock: Secondary | ICD-10-CM

## 2011-08-05 MED ORDER — DOXYCYCLINE HYCLATE 100 MG PO CAPS: 100.0000 mg | ORAL_CAPSULE | Freq: Two times a day (BID) | ORAL | Status: DC

## 2011-08-05 MED ORDER — ONDANSETRON 4 MG PO TBDP
ORAL_TABLET | ORAL | Status: AC
  Filled 2011-08-05: qty 2

## 2011-08-05 MED ORDER — IBUPROFEN 800 MG PO TABS
ORAL_TABLET | ORAL | Status: AC
  Filled 2011-08-05: qty 1

## 2011-08-05 MED ORDER — IBUPROFEN 800 MG PO TABS: 800.0000 mg | ORAL_TABLET | Freq: Three times a day (TID) | ORAL | Status: DC

## 2011-08-05 MED ORDER — IBUPROFEN 800 MG PO TABS
800.0000 mg | ORAL_TABLET | Freq: Once | ORAL | Status: AC
Start: 2011-08-05 — End: 2011-08-05
  Administered 2011-08-05: 800 mg via ORAL

## 2011-08-05 MED ORDER — ONDANSETRON 4 MG PO TBDP
8.0000 mg | ORAL_TABLET | Freq: Once | ORAL | Status: AC
  Administered 2011-08-05: 8 mg via ORAL

## 2011-08-05 MED ORDER — DOXYCYCLINE HYCLATE 100 MG PO CAPS: 100.0000 mg | ORAL_CAPSULE | Freq: Two times a day (BID) | ORAL | Status: AC
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
## 2011-08-05 NOTE — ED Notes (Signed)
Pt c/o multiple abcesses to R buttock.  Pt states he was seen here and I&D on Sunday but states pain has increased and additional are present.

## 2011-08-06 ENCOUNTER — Other Ambulatory Visit: Payer: Self-pay | Admitting: *Deleted

## 2011-08-06 NOTE — ED Provider Notes (Signed)
Medical screening examination/treatment/procedure(s) were performed by non-physician practitioner and as supervising physician I was immediately available for consultation/collaboration.  Leslee Home, M.D.   Reuben Likes, MD 08/06/11 1051

## 2011-08-07 ENCOUNTER — Ambulatory Visit (HOSPITAL_COMMUNITY)
Admission: RE | Admit: 2011-08-07 | Discharge: 2011-08-07 | Disposition: A | Discharge status: Home / Self Care | Payer: Medicaid Other | Source: Ambulatory Visit | Attending: Internal Medicine | Admitting: Internal Medicine

## 2011-08-07 ENCOUNTER — Emergency Department (HOSPITAL_COMMUNITY)
Admission: EM | Admit: 2011-08-07 | Discharge: 2011-08-07 | Discharge status: Against Medical Advice | Payer: Medicaid Other | Attending: Emergency Medicine | Admitting: Emergency Medicine

## 2011-08-07 ENCOUNTER — Encounter: Payer: Self-pay | Admitting: Internal Medicine

## 2011-08-07 ENCOUNTER — Emergency Department (HOSPITAL_COMMUNITY): Payer: Medicaid Other

## 2011-08-07 ENCOUNTER — Other Ambulatory Visit: Payer: Self-pay

## 2011-08-07 ENCOUNTER — Encounter (HOSPITAL_COMMUNITY): Payer: Self-pay | Admitting: Emergency Medicine

## 2011-08-07 ENCOUNTER — Ambulatory Visit (INDEPENDENT_AMBULATORY_CARE_PROVIDER_SITE_OTHER): Payer: Medicaid Other | Admitting: Internal Medicine

## 2011-08-07 ENCOUNTER — Telehealth: Payer: Self-pay | Admitting: *Deleted

## 2011-08-07 VITALS — BP 120/74 | HR 80 | Temp 97.9°F | Ht 73.0 in | Wt 185.1 lb

## 2011-08-07 DIAGNOSIS — I10 Essential (primary) hypertension: Secondary | ICD-10-CM | POA: Insufficient documentation

## 2011-08-07 DIAGNOSIS — J449 Chronic obstructive pulmonary disease, unspecified: Secondary | ICD-10-CM | POA: Insufficient documentation

## 2011-08-07 DIAGNOSIS — R110 Nausea: Secondary | ICD-10-CM

## 2011-08-07 DIAGNOSIS — R109 Unspecified abdominal pain: Secondary | ICD-10-CM

## 2011-08-07 DIAGNOSIS — Z79899 Other long term (current) drug therapy: Secondary | ICD-10-CM | POA: Insufficient documentation

## 2011-08-07 DIAGNOSIS — R079 Chest pain, unspecified: Secondary | ICD-10-CM

## 2011-08-07 DIAGNOSIS — L0291 Cutaneous abscess, unspecified: Secondary | ICD-10-CM | POA: Insufficient documentation

## 2011-08-07 DIAGNOSIS — J4489 Other specified chronic obstructive pulmonary disease: Secondary | ICD-10-CM | POA: Insufficient documentation

## 2011-08-07 DIAGNOSIS — Z8673 Personal history of transient ischemic attack (TIA), and cerebral infarction without residual deficits: Secondary | ICD-10-CM | POA: Insufficient documentation

## 2011-08-07 DIAGNOSIS — Z8739 Personal history of other diseases of the musculoskeletal system and connective tissue: Secondary | ICD-10-CM | POA: Insufficient documentation

## 2011-08-07 DIAGNOSIS — L0390 Cellulitis, unspecified: Secondary | ICD-10-CM

## 2011-08-07 DIAGNOSIS — Z7982 Long term (current) use of aspirin: Secondary | ICD-10-CM | POA: Insufficient documentation

## 2011-08-07 DIAGNOSIS — G8929 Other chronic pain: Secondary | ICD-10-CM | POA: Insufficient documentation

## 2011-08-07 DIAGNOSIS — E119 Type 2 diabetes mellitus without complications: Secondary | ICD-10-CM | POA: Insufficient documentation

## 2011-08-07 LAB — BASIC METABOLIC PANEL
Chloride: 100 mEq/L (ref 96–112)
GFR calc Af Amer: >90 mL/min (ref >90)
Potassium: 4.9 mEq/L (ref 3.5–5.1)
Sodium: 136 mEq/L (ref 135–145)

## 2011-08-07 LAB — CBC
HCT: 43.8 % (ref 39.0–52.0)
Hemoglobin: 15 g/dL (ref 13.0–17.0)
RBC: 4.86 MIL/uL (ref 4.22–5.81)
RDW: 13.9 % (ref 11.5–15.5)
WBC: 10.5 10*3/uL (ref 4.0–10.5)

## 2011-08-07 LAB — HEPATIC FUNCTION PANEL
ALT: 18 U/L (ref 0–53)
Total Protein: 8.3 g/dL (ref 6.0–8.3)

## 2011-08-07 LAB — POCT I-STAT TROPONIN I

## 2011-08-07 MED ORDER — ONDANSETRON HCL 4 MG/2ML IJ SOLN
4.0000 mg | Freq: Once | INTRAMUSCULAR | Status: AC
  Administered 2011-08-07: 4 mg via INTRAVENOUS
  Filled 2011-08-07: qty 2

## 2011-08-07 MED ORDER — MORPHINE SULFATE 4 MG/ML IJ SOLN
4.0000 mg | Freq: Once | INTRAMUSCULAR | Status: AC
  Administered 2011-08-07: 4 mg via INTRAVENOUS
  Filled 2011-08-07: qty 1

## 2011-08-07 MED ORDER — SODIUM CHLORIDE 0.9 % IV BOLUS (SEPSIS)
1000.0000 mL | Freq: Once | INTRAVENOUS | Status: AC
  Administered 2011-08-07: 1000 mL via INTRAVENOUS

## 2011-08-07 MED ORDER — PANTOPRAZOLE SODIUM 40 MG PO TBEC: 40.0000 mg | DELAYED_RELEASE_TABLET | Freq: Every day | ORAL | Status: DC | PRN

## 2011-08-07 MED ORDER — ATENOLOL 50 MG PO TABS: 50.0000 mg | ORAL_TABLET | Freq: Every day | ORAL | Status: DC

## 2011-08-07 MED ORDER — RANITIDINE HCL 150 MG PO TABS: 150.0000 mg | ORAL_TABLET | Freq: Two times a day (BID) | ORAL | Status: DC | PRN

## 2011-08-07 NOTE — ED Notes (Signed)
Pt does not want to be admitted, agrees to sign out AMA. PA notified of the same. Benefits/risk discussed by PA at the bedside

## 2011-08-07 NOTE — Patient Instructions (Signed)
1. Please take all medications as prescribed.  3. If you have worsening of your symptoms or new symptoms arise, please call the clinic ([phone]), or go to the ER immediately if symptoms are severe.    

## 2011-08-07 NOTE — ED Notes (Signed)
Pt states he woke up around 5am with "gas".  States he then started having pressure across chest, nausea, and diaphoresis.  Pain relieved around 7:30am but still c/o nausea.  Denies sob.

## 2011-08-07 NOTE — Assessment & Plan Note (Signed)
Patient's chest pain is concerning for ACS. Currently patient doesn't have chest pain. We got stat 12-lead EKG which did not show elevated ST segment or Q-wave for ischemic disease. However, patient has significant risk factors for CAD, including hypertension, hyperlipidemia, diabetes, smoking, age of 61, and severe peripheral vascular disease. I discussed with my attending physician, Dr. Kem Kays, who suggested to transfer patient to ED for further evaluation and rule out acute coronary artery syndrome.
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
## 2011-08-07 NOTE — ED Provider Notes (Signed)
I saw and evaluated the patient, reviewed the resident's note and I agree with the findings and plan.  Patient presents with abdominal pain as well as chest pain that was at rest and associated with diaphoresis. He has a known vasculopath. Symptoms resolved on their own. Concern for possible ACS. Spoke with patient on admission for evaluation of this and he has deferred this time. Will check a delta troponin and see the patient again about admission. If he is reluctant, he will sign out AGAINST MEDICAL ADVICE  Toy Baker, MD 08/07/11 [phone]
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
## 2011-08-07 NOTE — Assessment & Plan Note (Signed)
Patient's abdominal pain is likely due to gastritis. Another possibility is a typical presentation of ACS. Patient has ibuprofen in his medication list, but he states that he's not taking ibuprofen recently. It is less likely that his abdominal pain is due to side effects of NSIADs. Patient will be transferred to the emergency room. If he is ruled out for ACS, he may need to have symptomatic treatment for his abdominal pain.

## 2011-08-07 NOTE — Telephone Encounter (Signed)
Pt called c/o of nausea past day - went to Urgent Care and was placed on antibiotic for infection. Has taken antibiotic before with no problems. Denies vomiting. Wants Phenergan. Appt made today 1:45PM. Pt aware. Stanton Kidney Demarie Hyneman RN 08/07/11 10:15AM

## 2011-08-07 NOTE — Assessment & Plan Note (Addendum)
Patient is currently on doxycycline which was given in his recent visit to ED. He's currently afebrile and has no signs of sepsis. We'll continue doxycycline.

## 2011-08-07 NOTE — ED Notes (Signed)
PA at bedside.

## 2011-08-07 NOTE — ED Notes (Signed)
Patient transported to CT 

## 2011-08-08 LAB — CULTURE, ROUTINE-ABSCESS

## 2011-08-10 NOTE — ED Provider Notes (Signed)
I saw and evaluated the patient, reviewed the resident's note and I agree with the findings and plan.  Toy Baker, MD 08/10/11 [phone]

## 2011-08-11 LAB — GLUCOSE, CAPILLARY: Glucose-Capillary: 95 mg/dL (ref 70–99)

## 2011-08-13 ENCOUNTER — Telehealth (HOSPITAL_COMMUNITY): Payer: Self-pay | Admitting: *Deleted

## 2011-08-13 NOTE — ED Notes (Signed)
Abscess culture R buttocks: Few MRSA.  Pt. adequately treated with Doxycycline. I called and left a message to call. Vassie Moselle 08/13/2011

## 2011-08-14 ENCOUNTER — Telehealth (HOSPITAL_COMMUNITY): Payer: Self-pay | Admitting: *Deleted

## 2011-08-14 NOTE — ED Notes (Signed)
Pt. called back on VM 8/1 @ 1817, 1828 and 1832.  8/2 1150  I called pt. back.  Pt. verified x 2 and given result.  Pt. told he was adequately treated with Doxycycline and given MRSA instructions. Pt. voiced understanding. [name] 08/14/2011

## 2011-08-19 ENCOUNTER — Encounter: Payer: Self-pay | Admitting: Internal Medicine

## 2011-08-19 ENCOUNTER — Ambulatory Visit (INDEPENDENT_AMBULATORY_CARE_PROVIDER_SITE_OTHER): Payer: Medicaid Other | Admitting: Internal Medicine

## 2011-08-19 VITALS — BP 116/60 | HR 69 | Temp 97.4°F | Ht 73.0 in | Wt 186.6 lb

## 2011-08-19 DIAGNOSIS — I10 Essential (primary) hypertension: Secondary | ICD-10-CM

## 2011-08-19 DIAGNOSIS — G8929 Other chronic pain: Secondary | ICD-10-CM

## 2011-08-19 DIAGNOSIS — M79609 Pain in unspecified limb: Secondary | ICD-10-CM

## 2011-08-19 DIAGNOSIS — E785 Hyperlipidemia, unspecified: Secondary | ICD-10-CM

## 2011-08-19 DIAGNOSIS — I739 Peripheral vascular disease, unspecified: Secondary | ICD-10-CM

## 2011-08-19 DIAGNOSIS — E119 Type 2 diabetes mellitus without complications: Secondary | ICD-10-CM

## 2011-08-19 DIAGNOSIS — Z720 Tobacco use: Secondary | ICD-10-CM

## 2011-08-19 DIAGNOSIS — I6523 Occlusion and stenosis of bilateral carotid arteries: Secondary | ICD-10-CM

## 2011-08-19 LAB — LIPID PANEL
HDL: 37 mg/dL — ABNORMAL LOW (ref >39)
LDL Cholesterol: 122 mg/dL — ABNORMAL HIGH (ref 0–99)
Total CHOL/HDL Ratio: 5.9 Ratio
Triglycerides: 300 mg/dL — ABNORMAL HIGH (ref <150)

## 2011-08-19 MED ORDER — NICOTINE 21 MG/24HR TD PT24: 1.0000 | MEDICATED_PATCH | TRANSDERMAL | Status: AC

## 2011-08-19 MED ORDER — CILOSTAZOL 100 MG PO TABS: 100.0000 mg | ORAL_TABLET | Freq: Two times a day (BID) | ORAL | Status: DC

## 2011-08-19 MED ORDER — DICLOFENAC SODIUM 1 % TD GEL: 1.0000 "application " | Freq: Four times a day (QID) | TRANSDERMAL | Status: DC
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
## 2011-08-19 NOTE — Patient Instructions (Addendum)
Your leg pain is going to be a chronic issue.  There are several things we can do to decrease the pain -quitting smoking will help decrease any component of your Peripheral Vascular Disease contributing to the pain.  We are prescribing nicotine patches -following-up at the Pain Clinic will be helpful, and they can try multiple different medications/interventions to help your pain -we will check a cholesterol panel today, which can be helpful in preventing progression of peripheral vascular disease -we are prescribing Voltaren gel, which you can apply to the area for pain relief -we are prescribing Cilostazol, take 1 tablet twice per day to help with symptoms of peripheral vascular disease  Please return for a follow-up visit in 1 month

## 2011-08-21 ENCOUNTER — Encounter: Payer: Self-pay | Admitting: Internal Medicine

## 2011-08-21 DIAGNOSIS — I6523 Occlusion and stenosis of bilateral carotid arteries: Secondary | ICD-10-CM | POA: Insufficient documentation
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
## 2011-08-21 NOTE — Assessment & Plan Note (Signed)
See above; checking lipid panel, prescribed nicotine patches and advised smoking cessation, starting cilostazol

## 2011-08-21 NOTE — Assessment & Plan Note (Signed)
Currently well-controlled, continue current regimen 

## 2011-08-21 NOTE — Assessment & Plan Note (Signed)
-  check lipid panel today  Addendum 08/21/11 - non-fasting lipid panel showed high calculated LDL.  Will obtain direct LDL.

## 2011-09-02 ENCOUNTER — Ambulatory Visit: Payer: Medicaid Other | Admitting: Neurosurgery

## 2011-09-08 ENCOUNTER — Ambulatory Visit: Payer: Medicaid Other | Admitting: Neurosurgery

## 2011-09-15 IMAGING — MR MR MRA HEAD W/O CM
9 series · 16 of 16 positions shown · non-contrast
Comparison: Cerebral angiogram 08/20/2008.  CT head 02/03/2009.

MRI HEAD

CLINICAL DATA: New dizzy spells and blurred vision.

MRI HEAD WITHOUT CONTRAST
MRA HEAD WITHOUT CONTRAST
TECHNIQUE: Multiplanar, multiecho pulse sequences of the brain and
surrounding structures were obtained according to standard protocol
without intravenous contrast.  Angiographic images of the head were
obtained using MRA technique without contrast.

[Series 3: T1 · sagittal · 5.0mm · 0.47mm/px · 1 of 18 slices shown]
[im 1/18]
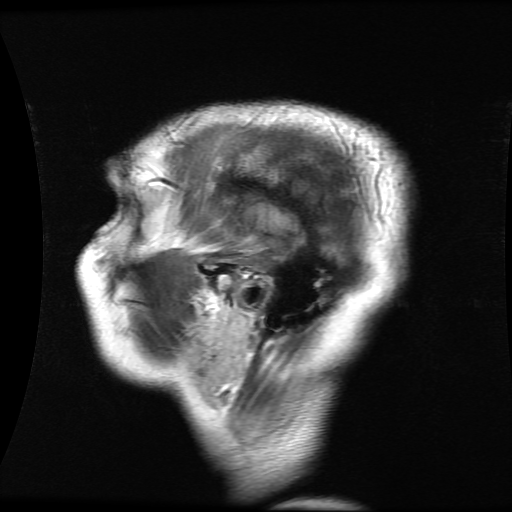

[Series 5: T2 · axial · 5.0mm · 0.43mm/px · 1 of 21 slices shown (1 of 2)]
[im 1/21]
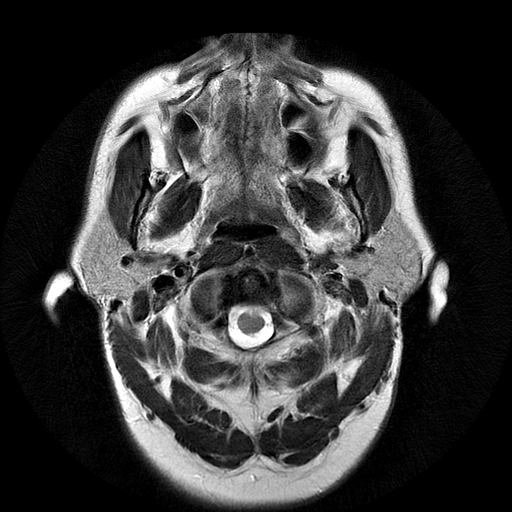

[Series 6: FLAIR · axial · 5.0mm · 0.43mm/px · 1 of 22 slices shown]
[im 1/22]
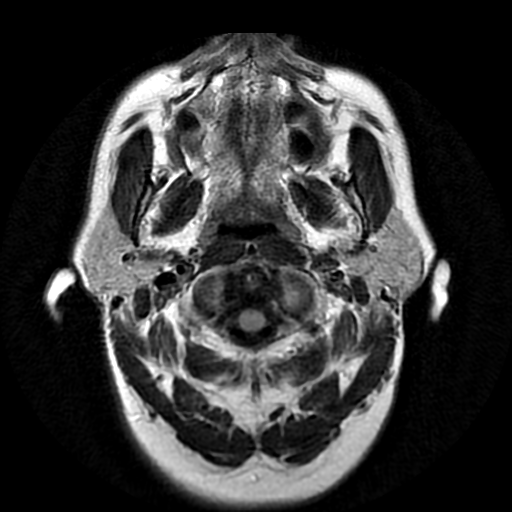

[Series 7: T2-star · axial · 5.0mm · 0.43mm/px · 1 of 22 slices shown]
[im 1/22]
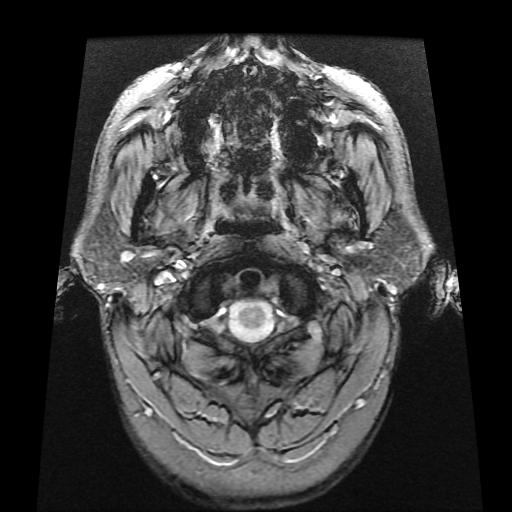

[Series 8: ax fspgr 3d · axial · non-contrast · 3.0mm · 0.43mm/px · z∈[-40,+110]mm · 2 of 54 slices shown]
[im 1/54]
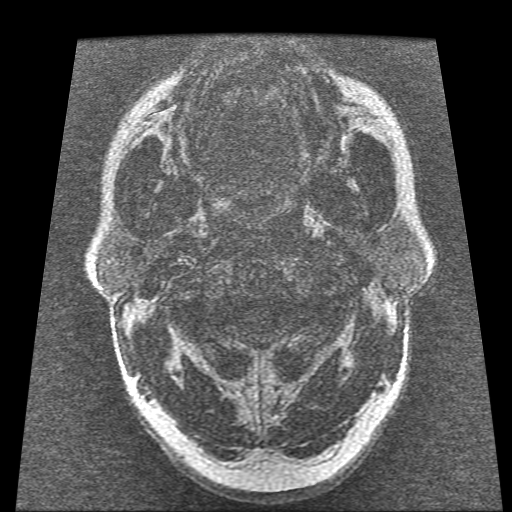
[im 54/54]
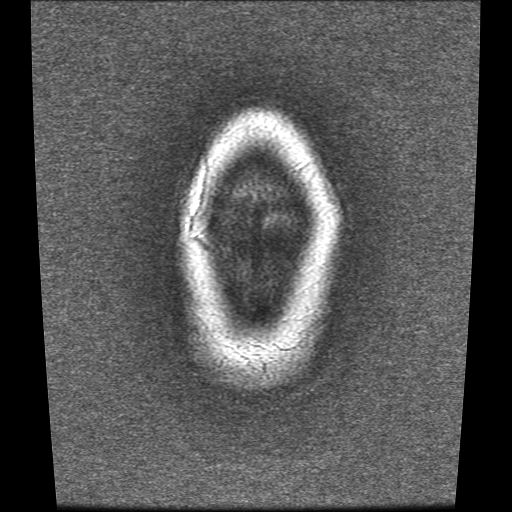

[Series 9: (id) mt fs · axial · 1.4mm · 0.43mm/px · z∈[-36,+70]mm · 6 of 160 slices shown]
[im 1/160]
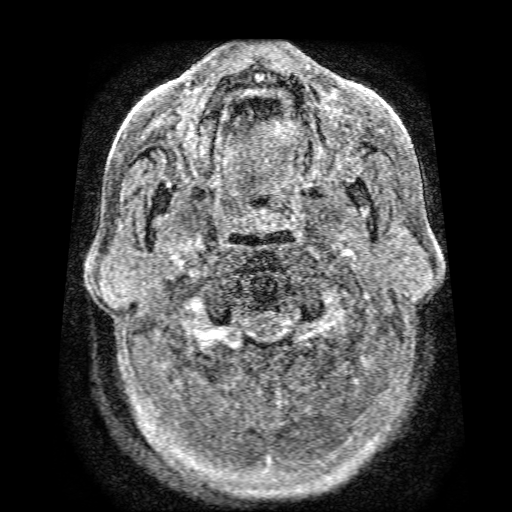
[im 32/160]
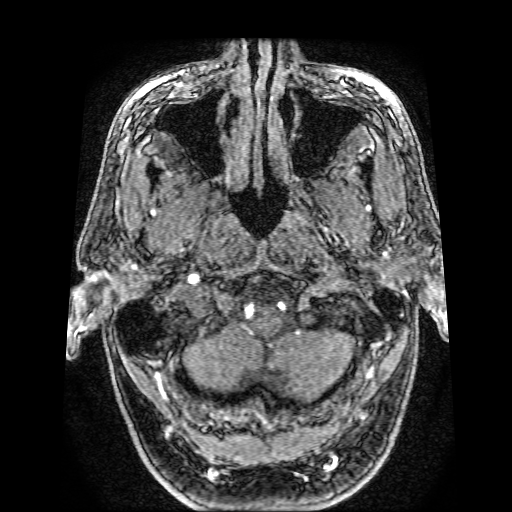
[im 64/160]
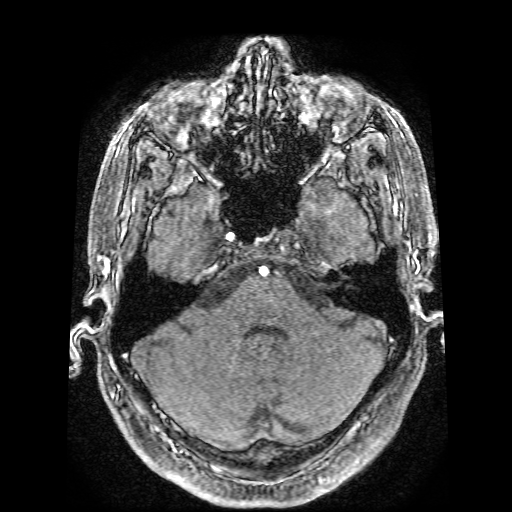
[im 96/160]
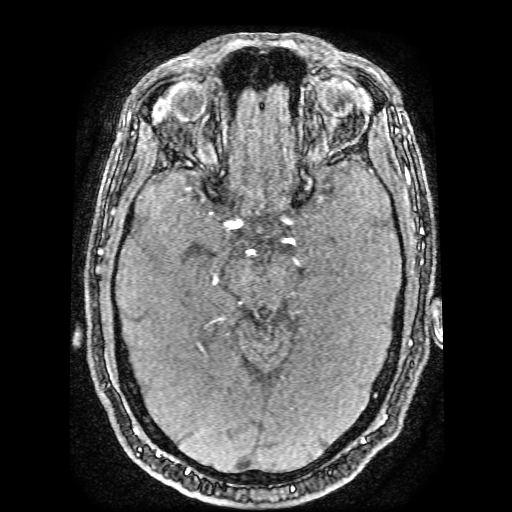
[im 128/160]
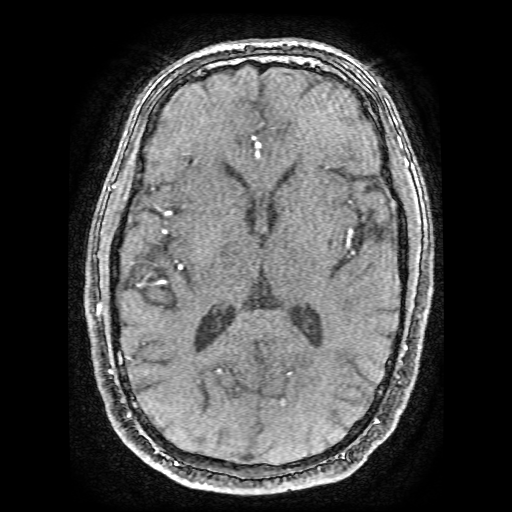
[im 160/160]
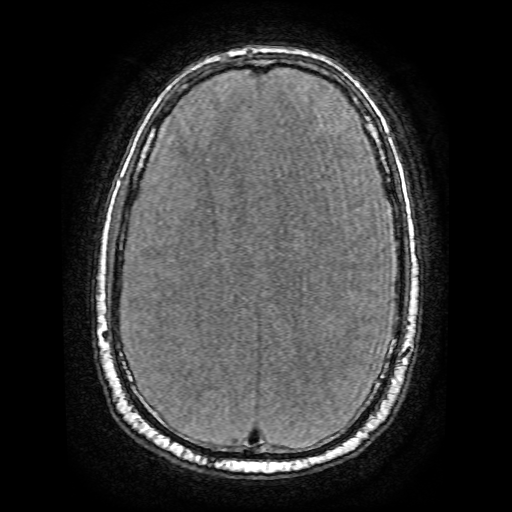

[Series 10: DWI · axial · 5.0mm · 1.09mm/px · z∈[-26,+121]mm · 2 of 58 slices shown (1 of 2)]
[im 1/58]
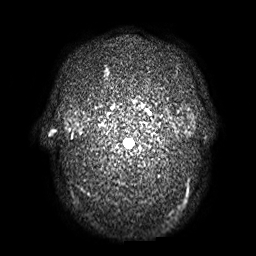
[im 58/58]
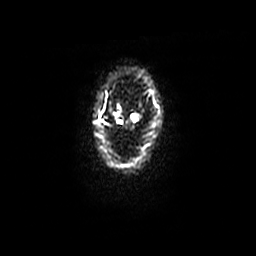

[Series 12: T2 · coronal · 5.0mm · 0.39mm/px · 1 of 26 slices shown (2 of 2)]
[im 1/26]
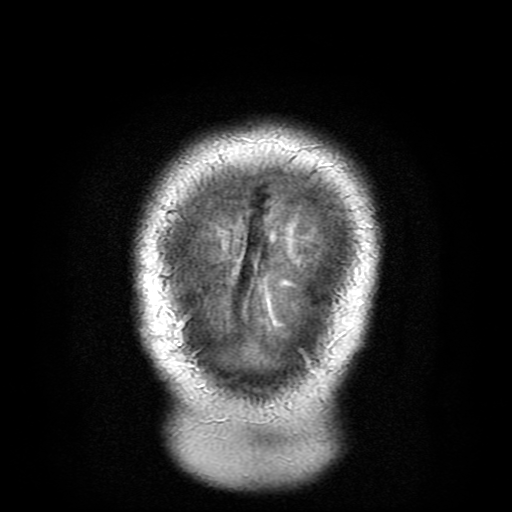

[Series 1000: DWI · axial · 5.0mm · 1.09mm/px · 1 of 29 slices shown (2 of 2)]
[im 1/29]
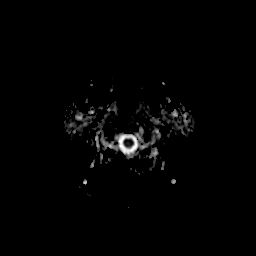

[16 of 16 positions shown; findings below may reference images not displayed]

FINDINGS: There is no evidence for acute stroke, acute
intracranial hemorrhage, mass lesion, hydrocephalus, or extra-axial
fluid.  There is a chronic infarct affecting the left frontal and
left temporal opercular cortex also involving the insular region.
This relates to a chronic left hemisphere ischemia.

Mild premature atrophy is seen.  There is chronic microvascular
ischemic change throughout the periventricular and subcortical
white matter.  The pituitary and cerebellar tonsils are
unremarkable.  The major intracranial vascular structures appear
widely patent.  There is no significant sinus or mastoid disease.
IMPRESSION: Chronic changes of left MCA territory infarct.  Atrophy and small
vessel disease. No acute intracranial findings.

MRA HEAD
FINDINGS: Redemonstrated is complete occlusion of the left internal
carotid artery in its cervical and skull base segments, with
collateral flow reconstituting the supraclinoid ICA via external
collaterals as well as right to left via the anterior communicating
artery.  The right carotid demonstrates moderate to severe disease
in its skull base and cavernous segments, with a focal distal
cavernous stenosis estimated to be 75-90%.  There is mild
atherosclerotic aneurysmal outpouching from the proximal and distal
cavernous segments. The supraclinoid ICA is widely patent.

The basilar artery demonstrates mild nonstenotic atherosclerotic
change.  Right greater than left vertebral arteries reconstitute
the basilar.

There is a reduction in caliber and intensity of flow related
enhancement in the left anterior circulation related to left
carotid occlusion.  There is a focal narrowing of 50% at the origin
of the right anterior cerebral artery.  The right middle cerebral
artery is mildly irregular but patent.  Both posterior cerebral
arteries are widely patent proximally.  Mild irregularity distal
MCA and PCA branches suggest intracranial atherosclerotic disease.

Compared with prior angiography from August 2008, the right carotid
siphon stenosis has progressed.
IMPRESSION: Progression of right carotid disease, now with a focal stenosis of
the distal cavernous segment estimated to be 75-90%.

Continued collateral flow reconstitutes the left anterior
circulation, which appears grossly unchanged, but diminished
compared to the right.

No significant change in the appearance of the vertebrobasilar
system or the major proximal ACA/MCA/PCA segments compared with
prior angiography.

## 2011-09-16 ENCOUNTER — Encounter: Payer: Self-pay | Admitting: Internal Medicine
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
## 2011-09-23 ENCOUNTER — Encounter: Payer: Medicaid Other | Admitting: Internal Medicine

## 2011-09-24 ENCOUNTER — Other Ambulatory Visit: Payer: Self-pay | Admitting: Internal Medicine

## 2011-09-24 MED ORDER — ATORVASTATIN CALCIUM 80 MG PO TABS: 80.0000 mg | ORAL_TABLET | Freq: Every day | ORAL | Status: DC

## 2011-09-29 ENCOUNTER — Ambulatory Visit: Payer: Medicaid Other | Admitting: Neurosurgery

## 2011-10-03 ENCOUNTER — Other Ambulatory Visit: Payer: Self-pay | Admitting: Internal Medicine

## 2011-10-03 MED ORDER — CLOPIDOGREL BISULFATE 75 MG PO TABS: 75.0000 mg | ORAL_TABLET | Freq: Every day | ORAL | Status: DC

## 2011-10-05 ENCOUNTER — Encounter: Payer: Self-pay | Admitting: Neurosurgery

## 2011-10-06 ENCOUNTER — Ambulatory Visit: Payer: Medicaid Other | Admitting: Neurosurgery

## 2011-10-08 ENCOUNTER — Other Ambulatory Visit (HOSPITAL_COMMUNITY): Payer: Self-pay | Admitting: Interventional Radiology

## 2011-10-15 ENCOUNTER — Other Ambulatory Visit (HOSPITAL_COMMUNITY): Payer: Self-pay | Admitting: Interventional Radiology

## 2011-10-15 DIAGNOSIS — I6529 Occlusion and stenosis of unspecified carotid artery: Secondary | ICD-10-CM

## 2011-10-15 DIAGNOSIS — I672 Cerebral atherosclerosis: Secondary | ICD-10-CM

## 2011-10-15 DIAGNOSIS — G450 Vertebro-basilar artery syndrome: Secondary | ICD-10-CM

## 2011-10-16 ENCOUNTER — Telehealth: Payer: Self-pay | Admitting: Physician Assistant

## 2011-10-16 NOTE — Telephone Encounter (Signed)
10 mg valium called into patient's pharmacy to be taken prior to his MRI 10/0/13 for claustrophobia.

## 2011-10-21 ENCOUNTER — Ambulatory Visit (HOSPITAL_COMMUNITY)
Admission: RE | Admit: 2011-10-21 | Discharge: 2011-10-21 | Disposition: A | Discharge status: Home / Self Care | Payer: Medicaid Other | Source: Ambulatory Visit | Attending: Interventional Radiology | Admitting: Interventional Radiology

## 2011-10-21 ENCOUNTER — Telehealth (HOSPITAL_COMMUNITY): Payer: Self-pay

## 2011-10-21 ENCOUNTER — Telehealth: Payer: Self-pay | Admitting: Dietician

## 2011-10-21 DIAGNOSIS — I672 Cerebral atherosclerosis: Secondary | ICD-10-CM

## 2011-10-21 DIAGNOSIS — I6529 Occlusion and stenosis of unspecified carotid artery: Secondary | ICD-10-CM

## 2011-10-21 DIAGNOSIS — G450 Vertebro-basilar artery syndrome: Secondary | ICD-10-CM

## 2011-10-21 DIAGNOSIS — G319 Degenerative disease of nervous system, unspecified: Secondary | ICD-10-CM | POA: Insufficient documentation

## 2011-10-21 DIAGNOSIS — F411 Generalized anxiety disorder: Secondary | ICD-10-CM

## 2011-10-21 LAB — CREATININE, SERUM
Creatinine, Ser: 0.92 mg/dL (ref 0.50–1.35)
GFR calc Af Amer: >90 mL/min (ref >90)
GFR calc non Af Amer: >90 mL/min (ref >90)

## 2011-10-21 MED ORDER — GADOBENATE DIMEGLUMINE 529 MG/ML IV SOLN
15.0000 mL | Freq: Once | INTRAVENOUS | Status: AC
  Administered 2011-10-21: 15 mL via INTRAVENOUS
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
## 2011-10-26 ENCOUNTER — Telehealth (HOSPITAL_COMMUNITY): Payer: Self-pay

## 2011-10-26 NOTE — Telephone Encounter (Signed)
I spoke with [name] in regards to his MRI/MRA.  Dr. Corliss Skains stated that if he wasn't having any new symptoms that he would need a f/u MRI/Mra in 12 months.  Pt is on ASA and Plavix.  No new symptoms per Pt.

## 2011-10-29 ENCOUNTER — Telehealth: Payer: Self-pay | Admitting: Licensed Clinical Social Worker

## 2011-10-29 ENCOUNTER — Encounter: Payer: Self-pay | Admitting: Dietician

## 2011-10-29 NOTE — Telephone Encounter (Signed)
Mr. Cotten was referred to CSW for assistance with smoking cessation.  During appt with RD, pt voiced desire to try to quit smoking.  Pt unaware that his insurance will cover Medicaid and CSW will refer to [facility] pharmacist to help with obtaining coverage for patches if pt desires.  CSW placed call to Mr. Morejon and left message on voice mail.

## 2011-10-29 NOTE — Telephone Encounter (Signed)
Spoke with patient about appointment- he'd like one when he comes in next week for flu shot- foot exam, pneumovax and A1C also due. He agreed to referral to social work for assistance in quitting smoking. Referral made.  Will request front office schedule appointment on 10/23 am and call back with time.   Spoke with front office they will call patient with appointment.

## 2011-10-30 NOTE — Telephone Encounter (Signed)
CSW placed call to Mr. Ohman.  Pt is ready to quit smoking and was successful with NRT in the past.  Pt has not utilized the [employer] as he states he tried in the past and having a smoking cessation coach was not successful.  Pt states his success in the past came from NRT, patches.  Pt unaware that medicaid does cover nicotine patches.  CSW informed Mr. Fick, problem could have been r/t how the prescription was being entered by the pharmacist.  Pt states he does not know where his Rx for nicotine patches is and would like a new one call into his new pharmacy.  Pt states he is switching to [facility], ph# [phone], fax# [phone].  CSW informed Mr. Wymer of resources CSW has available to assist with smoking cessation.  Pt states he would notify CSW if add'l assistance is needed.  Pt aware CSW will inform PCP of need for Rx for nicotine patches.

## 2011-11-02 ENCOUNTER — Encounter: Payer: Self-pay | Admitting: Neurosurgery

## 2011-11-02 NOTE — Telephone Encounter (Signed)
Phoned in prescription to [facility].  Pharmacist expressed understanding that only certain patches covered under medicaid, and would be sure this order was covered.  Nicotine patch 21 mg x4 weeks, 14 mg x2 weeks, then 7 mg x2 weeks

## 2011-11-03 ENCOUNTER — Ambulatory Visit (INDEPENDENT_AMBULATORY_CARE_PROVIDER_SITE_OTHER): Payer: Medicaid Other | Admitting: Neurosurgery

## 2011-11-03 ENCOUNTER — Encounter: Payer: Self-pay | Admitting: Neurosurgery

## 2011-11-03 ENCOUNTER — Ambulatory Visit (INDEPENDENT_AMBULATORY_CARE_PROVIDER_SITE_OTHER): Payer: Medicaid Other | Admitting: Vascular Surgery

## 2011-11-03 VITALS — BP 112/63 | HR 79 | Resp 16 | Ht 73.0 in | Wt 190.0 lb

## 2011-11-03 DIAGNOSIS — Z48812 Encounter for surgical aftercare following surgery on the circulatory system: Secondary | ICD-10-CM

## 2011-11-03 DIAGNOSIS — I739 Peripheral vascular disease, unspecified: Secondary | ICD-10-CM

## 2011-11-03 DIAGNOSIS — I70219 Atherosclerosis of native arteries of extremities with intermittent claudication, unspecified extremity: Secondary | ICD-10-CM

## 2011-11-03 DIAGNOSIS — M79609 Pain in unspecified limb: Secondary | ICD-10-CM

## 2011-11-03 NOTE — Progress Notes (Signed)
Right lower extremity arterial duplex performed @ [facility] 11/03/2011
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
## 2011-11-03 NOTE — Progress Notes (Signed)
Ankle brachial indices performed @ [facility] 11/03/2011

## 2011-11-04 ENCOUNTER — Encounter: Payer: Self-pay | Admitting: Internal Medicine

## 2011-11-04 ENCOUNTER — Ambulatory Visit: Payer: Medicaid Other

## 2011-11-04 ENCOUNTER — Ambulatory Visit (INDEPENDENT_AMBULATORY_CARE_PROVIDER_SITE_OTHER): Payer: Medicaid Other | Admitting: Internal Medicine

## 2011-11-04 VITALS — BP 125/66 | HR 94 | Temp 96.6°F | Ht 73.0 in | Wt 180.6 lb

## 2011-11-04 DIAGNOSIS — Z79899 Other long term (current) drug therapy: Secondary | ICD-10-CM

## 2011-11-04 DIAGNOSIS — I10 Essential (primary) hypertension: Secondary | ICD-10-CM

## 2011-11-04 DIAGNOSIS — F329 Major depressive disorder, single episode, unspecified: Secondary | ICD-10-CM

## 2011-11-04 DIAGNOSIS — F411 Generalized anxiety disorder: Secondary | ICD-10-CM

## 2011-11-04 DIAGNOSIS — M79609 Pain in unspecified limb: Secondary | ICD-10-CM

## 2011-11-04 DIAGNOSIS — J449 Chronic obstructive pulmonary disease, unspecified: Secondary | ICD-10-CM

## 2011-11-04 DIAGNOSIS — G8929 Other chronic pain: Secondary | ICD-10-CM

## 2011-11-04 DIAGNOSIS — Z720 Tobacco use: Secondary | ICD-10-CM

## 2011-11-04 DIAGNOSIS — F17200 Nicotine dependence, unspecified, uncomplicated: Secondary | ICD-10-CM

## 2011-11-04 DIAGNOSIS — E119 Type 2 diabetes mellitus without complications: Secondary | ICD-10-CM

## 2011-11-04 DIAGNOSIS — M79672 Pain in left foot: Secondary | ICD-10-CM

## 2011-11-04 DIAGNOSIS — I739 Peripheral vascular disease, unspecified: Secondary | ICD-10-CM

## 2011-11-04 DIAGNOSIS — Z23 Encounter for immunization: Secondary | ICD-10-CM

## 2011-11-04 LAB — POCT GLYCOSYLATED HEMOGLOBIN (HGB A1C): Hemoglobin A1C: 5.6

## 2011-11-04 LAB — GLUCOSE, CAPILLARY: Glucose-Capillary: 151 mg/dL — ABNORMAL HIGH (ref 70–99)

## 2011-11-04 MED ORDER — CLOPIDOGREL BISULFATE 75 MG PO TABS: 75.0000 mg | ORAL_TABLET | Freq: Every day | ORAL | Status: DC

## 2011-11-04 MED ORDER — DICLOFENAC SODIUM 1 % TD GEL: 1.0000 "application " | Freq: Four times a day (QID) | TRANSDERMAL | Status: DC

## 2011-11-04 MED ORDER — PNEUMOCOCCAL VAC POLYVALENT 25 MCG/0.5ML IJ INJ: 0.5000 mL | INJECTION | INTRAMUSCULAR | Status: DC

## 2011-11-04 MED ORDER — ATENOLOL 50 MG PO TABS: 50.0000 mg | ORAL_TABLET | Freq: Every day | ORAL | Status: DC

## 2011-11-04 MED ORDER — NICOTINE 21 MG/24HR TD PT24: 1.0000 | MEDICATED_PATCH | TRANSDERMAL | Status: DC

## 2011-11-04 MED ORDER — FENOFIBRATE 48 MG PO TABS: 48.0000 mg | ORAL_TABLET | Freq: Every day | ORAL | Status: DC

## 2011-11-04 MED ORDER — CILOSTAZOL 100 MG PO TABS: 100.0000 mg | ORAL_TABLET | Freq: Two times a day (BID) | ORAL | Status: DC

## 2011-11-04 MED ORDER — ATORVASTATIN CALCIUM 80 MG PO TABS: 80.0000 mg | ORAL_TABLET | Freq: Every day | ORAL | Status: DC

## 2011-11-04 MED ORDER — PANTOPRAZOLE SODIUM 40 MG PO TBEC: 40.0000 mg | DELAYED_RELEASE_TABLET | Freq: Every day | ORAL | Status: DC | PRN

## 2011-11-04 MED ORDER — ASPIRIN 81 MG PO TBEC: 81.0000 mg | DELAYED_RELEASE_TABLET | Freq: Every day | ORAL | Status: DC

## 2011-11-04 MED ORDER — LISINOPRIL 5 MG PO TABS: 5.0000 mg | ORAL_TABLET | Freq: Every day | ORAL | Status: DC

## 2011-11-04 MED ORDER — INFLUENZA VIRUS VACC SPLIT PF IM SUSP: 0.5000 mL | Freq: Once | INTRAMUSCULAR | Status: DC

## 2011-11-04 MED ORDER — METFORMIN HCL 500 MG PO TABS: 500.0000 mg | ORAL_TABLET | Freq: Every day | ORAL | Status: DC

## 2011-11-04 NOTE — Patient Instructions (Signed)
For your chronic pain, we have made a referral to the [facility].  We will contact you with an appointment. -we have resent prescriptions for Voltaren gel and Cilostazol  Your diabetes is very well controlled.  We can decrease your Metformin medication to 1 tablet, once per day.  If your blood sugars are still well-controlled at your next visit, we can discuss stopping this medication.  We are referring you to Psychiatry to help improve your mood.  Please return for a follow-up visit in 3 months.
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
## 2011-11-04 NOTE — Assessment & Plan Note (Signed)
The patient continues to note chronic left foot pain.  Interested in referral to [facility] pain clinic, which he contacted independently.  I encouraged patient to follow-up with [facility] instead, since he is already established, but he [place] difficulties.  I explained at great length that the [facility] clinic would also try treating the patient's pain with non-narcotic medications, and that this move would not help the patient obtain narcotics, and he expressed understanding. -referral made to [facility] -renewed prescription for voltaren gel, encouraged patient to try this medication -renewed prescription for cilostazol, which patient was previously unable to fill due to financial constraints, to help with claudication pain.

## 2011-11-04 NOTE — Assessment & Plan Note (Signed)
The patient notes symptoms consistent with MDD.  Patient has tried paroxetine and citalopram in the past without success.  Patient states he has had success with clonazepam, but we discussed that this is not a good long-term medication.  Patient is interested in referral to psychiatry for counseling and possible medication administration. -will refer to psychiatry

## 2011-11-04 NOTE — Assessment & Plan Note (Signed)
Well-controlled, continue current regimen 

## 2011-11-04 NOTE — Assessment & Plan Note (Signed)
Patient encouraged towards his goal of smoking cessation.  Patient declines SW visit at this time for smoking cessation counseling.  Discussed nicotine replacement products and medications.  Patient prefers nicotine patch at this time -will try nicotine patches -follow-up at next visit
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
## 2011-11-06 ENCOUNTER — Telehealth: Payer: Self-pay | Admitting: Licensed Clinical Social Worker

## 2011-11-06 NOTE — Telephone Encounter (Signed)
[name] was referred to CSW for psychiatry referrals.  CSW placed call to [name].  Pt states he is interested in psychiatry for medication management but is open to counseling as well.  Pt has no preferred agency, would like agency near his home area code.  Pt requesting CSW to make appt for him and let him know when and where.  CSW utilized [place] site for providers [place] [id number].  Referral faxed to [facility] [phone], Fax [phone].  Once CSW confirms, agency able to accept referral, CSW will mail out ROI for pt to sign.

## 2011-11-10 ENCOUNTER — Other Ambulatory Visit: Payer: Self-pay | Admitting: *Deleted

## 2011-11-10 NOTE — Telephone Encounter (Signed)
PPA went into pt's home and noted she has  combivent, do you what her on this med?

## 2011-11-12 MED ORDER — HYDROXYZINE HCL 25 MG PO TABS: 25.0000 mg | ORAL_TABLET | Freq: Two times a day (BID) | ORAL | Status: DC | PRN

## 2011-11-12 NOTE — Telephone Encounter (Signed)
Patient has a history of COPD.  We have in our records that patient has albuterol for "rescue" inhaler.  Combivent (albuterol-ipratropium) is a fine alternative for this purpose, though it may be a little more expensive.  I don't see that we have prescribed this inhaler, perhaps it was given at an ED visit.  In the end, it doesn't matter which the patient uses as a "rescue" inhaler.  I'll keep albuterol on our med list since I think it will be cheaper for the patient, but it is ok for him to keep combivent at home and use this instead for this purpose.

## 2011-11-12 NOTE — Telephone Encounter (Signed)
CSW placed call to [facility] to confirm if agency was able to accept referral.  Agency has lost referral, CSW refaxed referral.  Agency to call CSW.

## 2011-11-12 NOTE — Telephone Encounter (Signed)
PPA made aware

## 2011-11-16 ENCOUNTER — Encounter: Payer: Self-pay | Admitting: Licensed Clinical Social Worker

## 2011-11-16 NOTE — Progress Notes (Signed)
Rec'd phone call from [facility] Leash) stating that Dr. Merwyn Katos will not be able to see this patient due to patient being discharged from prior pain mangement facility.  Reason he was discharged was because he was negative for prescribed medications. This office will not be able to assist the patient with any appointments for pain clinic - per [facility], 11/13/2011 - late chart by Dorie Rank RN, 11/16/2011, 9:19 P (chart note added because original pain clinic referral order not accessible for update.)

## 2011-11-16 NOTE — Telephone Encounter (Signed)
[place]  [facility] returned call to CSW on 10/31 stating appt's had been scheduled for 11/1 counseling and 11/4 psychiatry.  By the time CSW received voice mails the dates had already passed.  CSW placed call to [facility] requesting dates at least 2 weeks out to facilitate CSW sending written letter and ROI out to patient.   Pt rescheduled for Counseling on Friday, 11/15 at 1100 with therapist Corinna Gab and Friday, 11/22 at 1330 with Dr. Curly Shores.  CSW placed call to notify pt and sent letter with ROI for pt.  Pt aware and in agreement.

## 2011-11-22 ENCOUNTER — Emergency Department (HOSPITAL_COMMUNITY)
Admission: EM | Admit: 2011-11-22 | Discharge: 2011-11-23 | Disposition: A | Discharge status: Home / Self Care | Payer: Medicaid Other | Attending: Emergency Medicine | Admitting: Emergency Medicine

## 2011-11-22 ENCOUNTER — Emergency Department (HOSPITAL_COMMUNITY): Payer: Medicaid Other

## 2011-11-22 ENCOUNTER — Encounter (HOSPITAL_COMMUNITY): Payer: Self-pay | Admitting: Emergency Medicine

## 2011-11-22 DIAGNOSIS — Z79899 Other long term (current) drug therapy: Secondary | ICD-10-CM | POA: Insufficient documentation

## 2011-11-22 DIAGNOSIS — N390 Urinary tract infection, site not specified: Secondary | ICD-10-CM | POA: Insufficient documentation

## 2011-11-22 DIAGNOSIS — J449 Chronic obstructive pulmonary disease, unspecified: Secondary | ICD-10-CM | POA: Insufficient documentation

## 2011-11-22 DIAGNOSIS — J4489 Other specified chronic obstructive pulmonary disease: Secondary | ICD-10-CM | POA: Insufficient documentation

## 2011-11-22 DIAGNOSIS — I739 Peripheral vascular disease, unspecified: Secondary | ICD-10-CM | POA: Insufficient documentation

## 2011-11-22 DIAGNOSIS — R110 Nausea: Secondary | ICD-10-CM | POA: Insufficient documentation

## 2011-11-22 DIAGNOSIS — Z7982 Long term (current) use of aspirin: Secondary | ICD-10-CM | POA: Insufficient documentation

## 2011-11-22 DIAGNOSIS — R109 Unspecified abdominal pain: Secondary | ICD-10-CM | POA: Insufficient documentation

## 2011-11-22 DIAGNOSIS — F17200 Nicotine dependence, unspecified, uncomplicated: Secondary | ICD-10-CM | POA: Insufficient documentation

## 2011-11-22 DIAGNOSIS — M549 Dorsalgia, unspecified: Secondary | ICD-10-CM | POA: Insufficient documentation

## 2011-11-22 DIAGNOSIS — E119 Type 2 diabetes mellitus without complications: Secondary | ICD-10-CM | POA: Insufficient documentation

## 2011-11-22 DIAGNOSIS — Z8673 Personal history of transient ischemic attack (TIA), and cerebral infarction without residual deficits: Secondary | ICD-10-CM | POA: Insufficient documentation

## 2011-11-22 DIAGNOSIS — B1920 Unspecified viral hepatitis C without hepatic coma: Secondary | ICD-10-CM | POA: Insufficient documentation

## 2011-11-22 DIAGNOSIS — I10 Essential (primary) hypertension: Secondary | ICD-10-CM | POA: Insufficient documentation

## 2011-11-22 DIAGNOSIS — E785 Hyperlipidemia, unspecified: Secondary | ICD-10-CM | POA: Insufficient documentation

## 2011-11-22 LAB — URINALYSIS, ROUTINE W REFLEX MICROSCOPIC
Glucose, UA: NEGATIVE mg/dL
Ketones, ur: NEGATIVE mg/dL
Leukocytes, UA: NEGATIVE
Protein, ur: NEGATIVE mg/dL

## 2011-11-22 LAB — CBC WITH DIFFERENTIAL/PLATELET
Hemoglobin: 16.3 g/dL (ref 13.0–17.0)
Lymphs Abs: 3.6 10*3/uL (ref 0.7–4.0)
Monocytes Relative: 6 % (ref 3–12)
Neutro Abs: 4.5 10*3/uL (ref 1.7–7.7)
Neutrophils Relative %: 47 % (ref 43–77)
RBC: 5.1 MIL/uL (ref 4.22–5.81)

## 2011-11-22 LAB — COMPREHENSIVE METABOLIC PANEL
Alkaline Phosphatase: 85 U/L (ref 39–117)
BUN: 21 mg/dL (ref 6–23)
Chloride: 102 mEq/L (ref 96–112)
GFR calc Af Amer: 54 mL/min — ABNORMAL LOW (ref >90)
Glucose, Bld: 133 mg/dL — ABNORMAL HIGH (ref 70–99)
Potassium: 4.3 mEq/L (ref 3.5–5.1)
Total Bilirubin: 0.3 mg/dL (ref 0.3–1.2)

## 2011-11-22 LAB — URINE MICROSCOPIC-ADD ON

## 2011-11-22 MED ORDER — CEFTRIAXONE SODIUM 1 G IJ SOLR
1.0000 g | INTRAMUSCULAR | Status: DC
  Administered 2011-11-22: 1 g via INTRAVENOUS
  Filled 2011-11-22: qty 10

## 2011-11-22 MED ORDER — HYDROMORPHONE HCL PF 1 MG/ML IJ SOLN
1.0000 mg | Freq: Once | INTRAMUSCULAR | Status: AC
  Administered 2011-11-22: 1 mg via INTRAVENOUS
  Filled 2011-11-22: qty 1

## 2011-11-22 MED ORDER — SODIUM CHLORIDE 0.9 % IV BOLUS (SEPSIS)
1000.0000 mL | Freq: Once | INTRAVENOUS | Status: AC
  Administered 2011-11-22: 1000 mL via INTRAVENOUS

## 2011-11-22 MED ORDER — ONDANSETRON HCL 4 MG/2ML IJ SOLN
4.0000 mg | Freq: Once | INTRAMUSCULAR | Status: AC
  Administered 2011-11-22: 4 mg via INTRAVENOUS
  Filled 2011-11-22: qty 2

## 2011-11-22 NOTE — ED Notes (Addendum)
Patient complaining of kidney stones/left sided flank pain that started three days ago, with pain worsening today.  Patient reports history of kidney stones.  Denies nausea and vomiting; reports urinary retention; denies hematuria.  Patient appears very uncomfortable; tearful in triage.
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
## 2011-11-23 MED ORDER — IBUPROFEN 600 MG PO TABS: 600.0000 mg | ORAL_TABLET | Freq: Four times a day (QID) | ORAL | Status: DC | PRN

## 2011-11-23 MED ORDER — OXYCODONE-ACETAMINOPHEN 5-325 MG PO TABS: 1.0000 | ORAL_TABLET | ORAL | Status: DC | PRN

## 2011-11-23 NOTE — ED Notes (Signed)
Pt discharged.Vital signs stable and GCS 15 

## 2011-11-24 LAB — URINE CULTURE: Colony Count: >=100000

## 2011-11-24 NOTE — ED Provider Notes (Signed)
Medical screening examination/treatment/procedure(s) were performed by non-physician practitioner and as supervising physician I was immediately available for consultation/collaboration.   Wetona Viramontes B. Martavius Lusty, MD 11/24/11 0934 

## 2011-11-27 ENCOUNTER — Ambulatory Visit: Payer: Medicaid Other | Admitting: Internal Medicine

## 2011-12-03 ENCOUNTER — Ambulatory Visit: Payer: Medicaid Other | Admitting: Internal Medicine

## 2011-12-04 ENCOUNTER — Other Ambulatory Visit: Payer: Self-pay | Admitting: *Deleted

## 2011-12-04 DIAGNOSIS — E119 Type 2 diabetes mellitus without complications: Secondary | ICD-10-CM

## 2011-12-04 MED ORDER — METFORMIN HCL 500 MG PO TABS: 500.0000 mg | ORAL_TABLET | Freq: Every day | ORAL | Status: DC

## 2011-12-04 NOTE — Telephone Encounter (Signed)
Pharmacy sends note stating that pt has informed them that he has stopped cilostazol because it is causing him to have cramps, please advise

## 2011-12-04 NOTE — Telephone Encounter (Signed)
Cilostazol was prescribed for symptomatic relief of cramping.  It's not a required or life-prolonging medication.  If patient doesn't want to take it, he doesn't have to do so.

## 2011-12-07 ENCOUNTER — Ambulatory Visit: Payer: Medicaid Other | Admitting: Internal Medicine

## 2011-12-08 ENCOUNTER — Ambulatory Visit (INDEPENDENT_AMBULATORY_CARE_PROVIDER_SITE_OTHER): Payer: Medicaid Other | Admitting: Internal Medicine

## 2011-12-08 ENCOUNTER — Encounter: Payer: Self-pay | Admitting: Internal Medicine

## 2011-12-08 VITALS — BP 124/74 | HR 77 | Temp 96.3°F | Ht 73.0 in | Wt 179.9 lb

## 2011-12-08 DIAGNOSIS — F17200 Nicotine dependence, unspecified, uncomplicated: Secondary | ICD-10-CM

## 2011-12-08 DIAGNOSIS — M25552 Pain in left hip: Secondary | ICD-10-CM

## 2011-12-08 DIAGNOSIS — T07XXXA Unspecified multiple injuries, initial encounter: Secondary | ICD-10-CM

## 2011-12-08 DIAGNOSIS — I10 Essential (primary) hypertension: Secondary | ICD-10-CM

## 2011-12-08 DIAGNOSIS — Z87440 Personal history of urinary (tract) infections: Secondary | ICD-10-CM

## 2011-12-08 DIAGNOSIS — K219 Gastro-esophageal reflux disease without esophagitis: Secondary | ICD-10-CM

## 2011-12-08 DIAGNOSIS — M25559 Pain in unspecified hip: Secondary | ICD-10-CM

## 2011-12-08 DIAGNOSIS — R7989 Other specified abnormal findings of blood chemistry: Secondary | ICD-10-CM | POA: Insufficient documentation

## 2011-12-08 DIAGNOSIS — Z8614 Personal history of Methicillin resistant Staphylococcus aureus infection: Secondary | ICD-10-CM

## 2011-12-08 DIAGNOSIS — Z720 Tobacco use: Secondary | ICD-10-CM

## 2011-12-08 DIAGNOSIS — R799 Abnormal finding of blood chemistry, unspecified: Secondary | ICD-10-CM

## 2011-12-08 DIAGNOSIS — E119 Type 2 diabetes mellitus without complications: Secondary | ICD-10-CM

## 2011-12-08 DIAGNOSIS — L089 Local infection of the skin and subcutaneous tissue, unspecified: Secondary | ICD-10-CM

## 2011-12-08 DIAGNOSIS — T148XXA Other injury of unspecified body region, initial encounter: Secondary | ICD-10-CM | POA: Insufficient documentation

## 2011-12-08 DIAGNOSIS — E785 Hyperlipidemia, unspecified: Secondary | ICD-10-CM

## 2011-12-08 LAB — GLUCOSE, CAPILLARY: Glucose-Capillary: 114 mg/dL — ABNORMAL HIGH (ref 70–99)

## 2011-12-08 LAB — BASIC METABOLIC PANEL
BUN: 11 mg/dL (ref 6–23)
CO2: 23 mEq/L (ref 19–32)
Calcium: 9.4 mg/dL (ref 8.4–10.5)
Creat: 0.89 mg/dL (ref 0.50–1.35)
Glucose, Bld: 99 mg/dL (ref 70–99)

## 2011-12-08 MED ORDER — DOXYCYCLINE HYCLATE 100 MG PO TBEC: 100.0000 mg | DELAYED_RELEASE_TABLET | Freq: Two times a day (BID) | ORAL | Status: DC

## 2011-12-08 MED ORDER — RANITIDINE HCL 150 MG PO TABS: 150.0000 mg | ORAL_TABLET | Freq: Two times a day (BID) | ORAL | Status: DC | PRN

## 2011-12-08 MED ORDER — DOXYCYCLINE HYCLATE 100 MG PO CAPS: 100.0000 mg | ORAL_CAPSULE | Freq: Two times a day (BID) | ORAL | Status: DC

## 2011-12-08 MED ORDER — ATORVASTATIN CALCIUM 80 MG PO TABS: 40.0000 mg | ORAL_TABLET | Freq: Every day | ORAL | Status: DC

## 2011-12-08 MED ORDER — TRAMADOL HCL 50 MG PO TABS: 50.0000 mg | ORAL_TABLET | Freq: Four times a day (QID) | ORAL | Status: DC | PRN

## 2011-12-08 MED ORDER — DOXYCYCLINE HYCLATE 50 MG PO CAPS: 100.0000 mg | ORAL_CAPSULE | Freq: Two times a day (BID) | ORAL | Status: DC

## 2011-12-08 MED ORDER — PROMETHAZINE HCL 25 MG PO TABS: 25.0000 mg | ORAL_TABLET | Freq: Four times a day (QID) | ORAL | Status: DC | PRN

## 2011-12-08 NOTE — Assessment & Plan Note (Signed)
Please assess at return visit patient is supposed to get already ordered b/l hip Xray 12/15/11. Send to Xray due to no prior h/o Xray hip

## 2011-12-08 NOTE — Assessment & Plan Note (Signed)
Counseled smoking cessation and given info He has not started patches yet Decreased 3 ppd to 1 ppd and has previously quit x 8 months.

## 2011-12-08 NOTE — Progress Notes (Signed)
I saw patient and discussed his care with resident Dr. Shirlee Latch.  I agree with the plans as outlined in her note.

## 2011-12-08 NOTE — Assessment & Plan Note (Addendum)
Drew a line of demarcation around mild erythema and induration Left forearm suspect staph or step with h/o MRSA infections in the past this may be MRSA Blood cultures obtained x 2, wound culture and gm stain RX Doxycycline 100 mg bid x 7 days not Bactrim d/t elevated Creatinine, Rx Ultram 50 mg #20 no RF for pain (pt denies being allergic), Rx Phenergan #10 no RF for nausea which may or may not be related to wound infection RTC 12/15/11

## 2011-12-08 NOTE — Patient Instructions (Addendum)
Come back in 1 week  Smoking Cessation, Tips for Success YOU CAN QUIT SMOKING If you are ready to quit smoking, congratulations! You have chosen to help yourself be healthier. Cigarettes bring nicotine, tar, carbon monoxide, and other irritants into your body. Your lungs, heart, and blood vessels will be able to work better without these poisons. There are many different ways to quit smoking. Nicotine gum, nicotine patches, a nicotine inhaler, or nicotine nasal spray can help with physical craving. Hypnosis, support groups, and medicines help break the habit of smoking. Here are some tips to help you quit for good.  Throw away all cigarettes.  Clean and remove all ashtrays from your home, work, and car.  On a card, write down your reasons for quitting. Carry the card with you and read it when you get the urge to smoke.  Cleanse your body of nicotine. Drink enough water and fluids to keep your urine clear or pale yellow. Do this after quitting to flush the nicotine from your body.  Learn to predict your moods. Do not let a bad situation be your excuse to have a cigarette. Some situations in your life might tempt you into wanting a cigarette.  Never have "just one" cigarette. It leads to wanting another and another. Remind yourself of your decision to quit.  Change habits associated with smoking. If you smoked while driving or when feeling stressed, try other activities to replace smoking. Stand up when drinking your coffee. Brush your teeth after eating. Sit in a different chair when you read the paper. Avoid alcohol while trying to quit, and try to drink fewer caffeinated beverages. Alcohol and caffeine may urge you to smoke.  Avoid foods and drinks that can trigger a desire to smoke, such as sugary or spicy foods and alcohol.  Ask people who smoke not to smoke around you.  Have something planned to do right after eating or having a cup of coffee. Take a walk or exercise to perk you up. This  will help to keep you from overeating.  Try a relaxation exercise to calm you down and decrease your stress. Remember, you may be tense and nervous for the first 2 weeks after you quit, but this will pass.  Find new activities to keep your hands busy. Play with a pen, coin, or rubber band. Doodle or draw things on paper.  Brush your teeth right after eating. This will help cut down on the craving for the taste of tobacco after meals. You can try mouthwash, too.  Use oral substitutes, such as lemon drops, carrots, a cinnamon stick, or chewing gum, in place of cigarettes. Keep them handy so they are available when you have the urge to smoke.  When you have the urge to smoke, try deep breathing.  Designate your home as a nonsmoking area.  If you are a heavy smoker, ask your caregiver about a prescription for nicotine chewing gum. It can ease your withdrawal from nicotine.  Reward yourself. Set aside the cigarette money you save and buy yourself something nice.  Look for support from others. Join a support group or smoking cessation program. Ask someone at home or at work to help you with your plan to quit smoking.  Always ask yourself, "Do I need this cigarette or is this just a reflex?" Tell yourself, "Today, I choose not to smoke," or "I do not want to smoke." You are reminding yourself of your decision to quit, even if you do smoke a  cigarette. HOW WILL I FEEL WHEN I QUIT SMOKING?  The benefits of not smoking start within days of quitting.  You may have symptoms of withdrawal because your body is used to nicotine (the addictive substance in cigarettes). You may crave cigarettes, be irritable, feel very hungry, cough often, get headaches, or have difficulty concentrating.  The withdrawal symptoms are only temporary. They are strongest when you first quit but will go away within 10 to 14 days.  When withdrawal symptoms occur, stay in control. Think about your reasons for quitting. Remind  yourself that these are signs that your body is healing and getting used to being without cigarettes.  Remember that withdrawal symptoms are easier to treat than the major diseases that smoking can cause.  Even after the withdrawal is over, expect periodic urges to smoke. However, these cravings are generally short-lived and will go away whether you smoke or not. Do not smoke!  If you relapse and smoke again, do not lose hope. Most smokers quit 3 times before they are successful.  If you relapse, do not give up! Plan ahead and think about what you will do the next time you get the urge to smoke. LIFE AS A NONSMOKER: MAKE IT FOR A MONTH, MAKE IT FOR LIFE Day 1: Hang this page where you will see it every day. Day 2: Get rid of all ashtrays, matches, and lighters. Day 3: Drink water. Breathe deeply between sips. Day 4: Avoid places with smoke-filled air, such as bars, clubs, or the smoking section of restaurants. Day 5: Keep track of how much money you save by not smoking. Day 6: Avoid boredom. Keep a good book with you or go to the movies. Day 7: Reward yourself! One week without smoking! Day 8: Make a dental appointment to get your teeth cleaned. Day 9: Decide how you will turn down a cigarette before it is offered to you. Day 10: Review your reasons for quitting. Day 11: Distract yourself. Stay active to keep your mind off smoking and to relieve tension. Take a walk, exercise, read a book, do a crossword puzzle, or try a new hobby. Day 12: Exercise. Get off the bus before your stop or use stairs instead of escalators. Day 13: Call on friends for support and encouragement. Day 14: Reward yourself! Two weeks without smoking! Day 15: Practice deep breathing exercises. Day 16: Bet a friend that you can stay a nonsmoker. Day 17: Ask to sit in nonsmoking sections of restaurants. Day 18: Hang up "No Smoking" signs. Day 19: Think of yourself as a nonsmoker. Day 20: Each morning, tell yourself  you will not smoke. Day 21: Reward yourself! Three weeks without smoking! Day 22: Think of smoking in negative ways. Remember how it stains your teeth, gives you bad breath, and leaves you short of breath. Day 23: Eat a nutritious breakfast. Day 24:Do not relive your days as a smoker. Day 25: Hold a pencil in your hand when talking on the telephone. Day 26: Tell all your friends you do not smoke. Day 27: Think about how much better food tastes. Day 28: Remember, one cigarette is one too many. Day 29: Take up a hobby that will keep your hands busy. Day 30: Congratulations! One month without smoking! Give yourself a big reward. Your caregiver can direct you to community resources or hospitals for support, which may include:  Group support.  Education.  Hypnosis.  Subliminal therapy. Document Released: 09/27/2003 Document Revised: 03/23/2011 Document Reviewed: 10/15/2008 [employer]  Patient Information 2013 [place], [place].  MRSA Overview MRSA stands for methicillin-resistant Staphylococcus aureus. It is a type of bacteria that is resistant to some common antibiotics. It can cause infections in the skin and many other places in the body. Staphylococcus aureus, often called "staph," is a bacteria that normally lives on the skin or in the nose. Staph on the surface of the skin or in the nose does not cause problems. However, if the staph enters the body through a cut, wound, or break in the skin, an infection can happen. Up until recently, infections with the MRSA type of staph mainly occurred in hospitals and other healthcare settings. There are now increasing problems with MRSA infections in the community as well. Infections with MRSA may be very serious or even life-threatening. Most MRSA infections are acquired in one of two ways:  Healthcare-associated MRSA (HA-MRSA)  This can be acquired by people in any healthcare setting. MRSA can be a big problem for hospitalized people, people in  nursing homes, people in rehabilitation facilities, people with weakened immune systems, dialysis patients, and those who have had surgery.  Community-associated MRSA (CA-MRSA)  Community spread of MRSA is becoming more common. It is known to spread in crowded settings, in jails and prisons, and in situations where there is close skin-to-skin contact, such as during sporting events or in locker rooms. MRSA can be spread through shared items, such as children's toys, razors, towels, or sports equipment. CAUSES  All staph, including MRSA, are normally harmless unless they enter the body through a scratch, cut, or wound, such as with surgery. All staph, including MRSA, can be spread from person-to-person by touching contaminated objects or through direct contact. SPECIAL GROUPS MRSA can present problems for special groups of people. Some of these groups include:  Breastfeeding women.  The most common problem is MRSA infection of the breast (mastitis). There is evidence that MRSA can be passed to an infant from infected breast milk. Your caregiver may recommend that you stop breastfeeding until the mastitis is under control.  If you are breastfeeding and have a MRSA infection in a place other than the breast, you may usually continue breastfeeding while under treatment. If taking antibiotics, ask your caregiver if it is safe to continue breastfeeding while taking your prescribed medicines.  Neonates (babies from birth to 56 month old) and infants (babies from 63 month to 32 year old).  There is evidence that MRSA can be passed to a newborn at birth if the mother has MRSA on the skin, in or around the birth canal, or an infection in the uterus, cervix, or vagina. MRSA infection can have the same appearance as a normal newborn or infant rash or several other skin infections. This can make it hard to diagnose MRSA.  Immune compromised people.  If you have an immune system problem, you may have a higher  chance of developing a MRSA infection.  People after any type of surgery.  Staph in general, including MRSA, is the most common cause of infections occurring at the site of recent surgery.  People on long-term steroid medicines.  These kinds of medicines can lower your resistance to infection. This can increase your chance of getting MRSA.  People who have had frequent hospitalizations, live in nursing homes or other residential care facilities, have venous or urinary catheters, or have taken multiple courses of antibiotic therapy for any reason. DIAGNOSIS  Diagnosis of MRSA is done by cultures of fluid samples that may come  from:  Swabs taken from cuts or wounds in infected areas.  Nasal swabs.  Saliva or deep cough specimens from the lungs (sputum).  Urine.  Blood. Many people are "colonized" with MRSA but have no signs of infection. This means that people carry the MRSA germ on their skin or in their nose and may never develop MRSA infection.  TREATMENT  Treatment varies and is based on how serious, how deep, or how extensive the infection is. For example:  Some skin infections, such as a small boil or abscess, may be treated by draining yellowish-white fluid (pus) from the site of the infection.  Deeper or more widespread soft tissue infections are usually treated with surgery to drain pus and with antibiotic medicine given by vein or by mouth. This may be recommended even if you are pregnant.  Serious infections may require a hospital stay. If antibiotics are given, they may be needed for several weeks. PREVENTION  Because many people are colonized with staph, including MRSA, preventing the spread of the bacteria from person-to-person is most important. The best way to prevent the spread of bacteria and other germs is through proper hand washing or by using alcohol-based hand disinfectants. The following are other ways to help prevent MRSA infection within the hospital and  community settings.   Healthcare settings:  Strict hand washing or hand disinfection procedures need to be followed before and after touching every patient.  Patients infected with MRSA are placed in isolation to prevent the spread of the bacteria.  Healthcare workers need to wear disposable gowns and gloves when touching or caring for patients infected with MRSA. Visitors may also be asked to wear a gown and gloves.  Hospital surfaces need to be disinfected frequently.  Community settings:  NIKE frequently with soap and water for at least 15 seconds. Otherwise, use alcohol-based hand disinfectants when soap and water is not available.  Make sure people who live with you wash their hands often, too.  Do not share personal items. For example, avoid sharing razors and other personal hygiene items, towels, clothing, and athletic equipment.  Wash and dry your clothes and bedding at the warmest temperatures recommended on the labels.  Keep wounds covered. Pus from infected sores may contain MRSA and other bacteria. Keep cuts and abrasions clean and covered with germ-free (sterile), dry bandages until they are healed.  If you have a wound that appears infected, ask your caregiver if a culture for MRSA and other bacteria should be done.  If you are breastfeeding, talk to your caregiver about MRSA. You may be asked to temporarily stop breastfeeding. HOME CARE INSTRUCTIONS   Take your antibiotics as directed. Finish them even if you start to feel better.  Avoid close contact with those around you as much as possible. Do not use towels, razors, toothbrushes, bedding, or other items that will be used by others.  To fight the infection, follow your caregiver's instructions for wound care. Wash your hands before and after changing your bandages.  If you have an intravascular device, such as a catheter, make sure you know how to care for it.  Be sure to tell any healthcare providers  that you have MRSA so they are aware of your infection. SEEK IMMEDIATE MEDICAL CARE IF:   The infection appears to be getting worse. Signs include:  Increased warmth, redness, or tenderness around the wound site.  A red line that extends from the infection site.  A dark color in the  area around the infection.  Wound drainage that is tan, yellow, or green.  A bad smell coming from the wound.  You feel sick to your stomach (nauseous) and throw up (vomit) or cannot keep medicine down.  You have a fever.  Your baby is older than 3 months with a rectal temperature of 102 F (38.9 C) or higher.  Your baby is 33 months old or younger with a rectal temperature of 100.4 F (38 C) or higher.  You have difficulty breathing. MAKE SURE YOU:   Understand these instructions.  Will watch your condition.  Will get help right away if you are not doing well or get worse. Document Released: 12/29/2004 Document Revised: 03/23/2011 Document Reviewed: 04/02/2010 [facility] Patient Information 2013 [place], [place].  Skin Infections A skin infection usually develops as a result of disruption of the skin barrier.  CAUSES  A skin infection might occur following:  Trauma or an injury to the skin such as a cut or insect sting.  Inflammation (as in eczema).  Breaks in the skin between the toes (as in athlete's foot).  Swelling (edema). SYMPTOMS  The legs are the most common site affected. Usually there is:  Redness.  Swelling.  Pain.  There may be red streaks in the area of the infection. TREATMENT   Minor skin infections may be treated with topical antibiotics, but if the skin infection is severe, hospital care and intravenous (IV) antibiotic treatment may be needed.  Most often skin infections can be treated with oral antibiotic medicine as well as proper rest and elevation of the affected area until the infection improves.  If you are prescribed oral antibiotics, it is important to  take them as directed and to take all the pills even if you feel better before you have finished all of the medicine.  You may apply warm compresses to the area for 20-30 minutes 4 times daily. You might need a tetanus shot now if:  You have no idea when you had the last one.  You have never had a tetanus shot before.  Your wound had dirt in it. If you need a tetanus shot and you decide not to get one, there is a rare chance of getting tetanus. Sickness from tetanus can be serious. If you get a tetanus shot, your arm may swell and become red and warm at the shot site. This is common and not a problem. SEEK MEDICAL CARE IF:  The pain and swelling from your infection do not improve within 2 days.  SEEK IMMEDIATE MEDICAL CARE IF:  You develop a fever, chills, or other serious problems.  Document Released: 02/06/2004 Document Revised: 03/23/2011 Document Reviewed: 12/19/2007 [facility] Patient Information 2013 [place], [place].  Cholesterol Cholesterol is a type of fat. Your body needs a small amount of cholesterol, but too much can cause health problems. Certain problems include heart attacks, strokes, and not enough blood flow to your heart, brain, kidneys, or feet. You get cholesterol in 2 ways:  Naturally.  By eating certain foods. HOME CARE  Eat a low-fat diet:  Eat less eggs, whole dairy products (whole milk, cheese, and butter), fatty meats, and fried foods.  Eat more fruits, vegetables, whole-wheat breads, lean chicken, and fish.  Follow your exercise program as told by your doctor.  Keep your weight at a healthy level. Talk to your doctor about what is right for you.  Only take medicine as told by your doctor.  Get your cholesterol checked once a year  or as told by your doctor. MAKE SURE YOU:  Understand these instructions.  Will watch your condition.  Will get help right away if you are not doing well or get worse. Document Released: 03/27/2008 Document Revised:  03/23/2011 Document Reviewed: 03/27/2008 [facility] Patient Information 2013 [place], [place].

## 2011-12-08 NOTE — Assessment & Plan Note (Addendum)
Lipid Panel     Component Value Date/Time   CHOL 219* 08/19/2011 1429   TRIG 300* 08/19/2011 1429   HDL 37* 08/19/2011 1429   CHOLHDL 5.9 08/19/2011 1429   VLDL 60* 08/19/2011 1429   LDLCALC 122* 08/19/2011 1429   Pt is taking Tricor 48 mg qhs. He stopped taking Lipitor due to a commercial on TV stating that it caused DM and he states he didn't have DM until he started taking Lipitor Review TG and due to his history of PVD, CAD he needs to be on a statin.  Informed pt to resume Lipitor 40 mg qhs and Cont. Tricor qd.

## 2011-12-08 NOTE — Assessment & Plan Note (Signed)
Continue Protonix 40 mg qd Rx refill Zantac 150 mg qd

## 2011-12-08 NOTE — Assessment & Plan Note (Signed)
Avoid NSAIDS, Bactrim, repeat BMET. Unable to obtain UA today Consider repeat UA due to abnormal previous 11/22/11
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
## 2011-12-11 LAB — WOUND CULTURE

## 2011-12-14 LAB — CULTURE, BLOOD (SINGLE)
Organism ID, Bacteria: NO GROWTH
Organism ID, Bacteria: NO GROWTH

## 2011-12-15 ENCOUNTER — Ambulatory Visit: Payer: Medicaid Other | Admitting: Internal Medicine

## 2011-12-21 ENCOUNTER — Other Ambulatory Visit: Payer: Self-pay | Admitting: *Deleted

## 2011-12-21 MED ORDER — FENOFIBRATE 48 MG PO TABS: 48.0000 mg | ORAL_TABLET | Freq: Every day | ORAL | Status: DC

## 2011-12-21 MED ORDER — LISINOPRIL 5 MG PO TABS: 5.0000 mg | ORAL_TABLET | Freq: Every day | ORAL | Status: DC

## 2011-12-25 ENCOUNTER — Encounter: Payer: Self-pay | Admitting: Radiation Oncology

## 2011-12-25 ENCOUNTER — Ambulatory Visit (HOSPITAL_COMMUNITY)
Admission: RE | Admit: 2011-12-25 | Discharge: 2011-12-25 | Disposition: A | Discharge status: Home / Self Care | Payer: Medicaid Other | Source: Ambulatory Visit | Attending: Internal Medicine | Admitting: Internal Medicine

## 2011-12-25 ENCOUNTER — Ambulatory Visit (INDEPENDENT_AMBULATORY_CARE_PROVIDER_SITE_OTHER): Payer: Medicaid Other | Admitting: Radiation Oncology

## 2011-12-25 VITALS — BP 108/69 | HR 66 | Temp 96.5°F | Ht 73.0 in | Wt 176.4 lb

## 2011-12-25 DIAGNOSIS — M25559 Pain in unspecified hip: Secondary | ICD-10-CM | POA: Insufficient documentation

## 2011-12-25 DIAGNOSIS — M25552 Pain in left hip: Secondary | ICD-10-CM

## 2011-12-25 DIAGNOSIS — H6120 Impacted cerumen, unspecified ear: Secondary | ICD-10-CM

## 2011-12-25 MED ORDER — HYDROCORTISONE 1 % EX CREA: 1.0000 "application " | TOPICAL_CREAM | Freq: Two times a day (BID) | CUTANEOUS | Status: DC | PRN

## 2011-12-25 MED ORDER — CLOPIDOGREL BISULFATE 75 MG PO TABS: 75.0000 mg | ORAL_TABLET | Freq: Every day | ORAL | Status: DC

## 2011-12-25 NOTE — Progress Notes (Signed)
  Subjective:    Patient ID: [name], male    DOB: May 27, 1960, 51 y.o.   MRN: [id number]  HPI Patient is a 51 year old man with past medical history significant for chronic left hip pain, opioid dependence, and polysubstance abuse who presents to clinic today for refill requests and a request for ear wax removal as he is having decreased hearing bilaterally. The patient denies any other new complaints today. He requests a refill for Phenergan, which he states he takes occasionally for nausea. He also requests a new prescription for narcotics, and was informed that he has broken the controlled substance medication agreement with this clinic, and cannot be provided narcotics at this time nor in the future. The patient stated that he understood.   The patient admits to continuing use of narcotics on a nonprescribed basis, stating that he "buys them on the street". When asked what drugs he is buying on the street, the patient states that he has been buying and using "percocet and heroin". The patient was informed that he would not have his phenergan prescription refilled today nor in the future, as this medication strongly potentiates opioids and could contribute to an overdose given his continued use of both prescription and illicit narcotics. The patient was disappointed, but stated that he understood.  At the time of the patient's last visit, an x-ray of the left hip been ordered the patient did not go to radiology following his visit. He states he would like to have the x-ray completed today.  Review of Systems  All other systems reviewed and are negative.       Objective:   Physical Exam  Constitutional: He is oriented to person, place, and time. He appears well-developed and well-nourished. He does not have a sickly appearance. He does not appear ill. No distress.  HENT:  Head: Normocephalic and atraumatic.       Extensive cerumen accumulation in bilateral external auditory canals. No  hearing deficits on exam.  Eyes: Pupils are equal, round, and reactive to light. No scleral icterus.  Neck: Normal range of motion. Neck supple. No tracheal deviation present.  Cardiovascular: Normal rate and regular rhythm.   No murmur heard. Pulmonary/Chest: Effort normal. He has no wheezes. He has no rales.  Abdominal: Soft. Bowel sounds are normal. He exhibits no distension. There is no tenderness.  Musculoskeletal: Normal range of motion. He exhibits no edema and no tenderness.  Neurological: He is alert and oriented to person, place, and time. No cranial nerve deficit.  Skin: Skin is warm and dry. No erythema.  Psychiatric: He has a normal mood and affect. His behavior is normal.          Assessment & Plan:

## 2011-12-25 NOTE — Patient Instructions (Addendum)
Cerumen Impaction  A cerumen impaction is when the wax in your ear forms a plug. This plug usually causes reduced hearing. Sometimes it also causes an earache or dizziness. Removing a cerumen impaction can be difficult and painful. The wax sticks to the ear canal. The canal is sensitive and bleeds easily. If you try to remove a heavy wax buildup with a cotton tipped swab, you may push it in further.  Irrigation with water, suction, and small ear curettes may be used to clear out the wax. If the impaction is fixed to the skin in the ear canal, ear drops may be needed for a few days to loosen the wax. People who build up a lot of wax frequently can use ear wax removal products available in your local drugstore.  SEEK MEDICAL CARE IF:    You develop an earache, increased hearing loss, or marked dizziness.  Document Released: 02/06/2004 Document Revised: 03/23/2011 Document Reviewed: 03/28/2009  [employer] Patient Information 2013 [employer].

## 2011-12-25 NOTE — Assessment & Plan Note (Addendum)
Chronic and unchanged. Patient did not have left an x-ray performed her previous visit, therefore he will have this performed today. Patient has been discharged from multiple clinics in pain centers to 2 control medication contract violations. He states that he believes his PCP was working on referring him to yet another pain management Center. The patient was informed that he would cannot be prescribed control medications by this clinic due both to previous breaches of his contract and also due to admitted continuing use of percocet, heroin, and any other opioid he is able to obtain. -X-ray left hip, with followup of results at next visit -Will defer issue of referral to pain management center to PCP, Dr. Manson Passey  12/28/2011 - called patient with results of hip x-ray, and explained to him that the results were negative for any acute abnormalities. Patient stated that he understood. He then went on to say that he was "just joking about using heroin the last few days. If you'd have checked my urine you'd have seen that it was clean". Patient was informed that the reason his urine wasn't checked for opioids was because he at no time in the future will be a candidate for chronic pain management at the clinic due to his multiple and continued violations of the terms of his previous contract. He was informed that regardless of whether he had been using heroin the last few days, he will not get any opioids from the [facility] in the future. He stated that he understood.

## 2011-12-25 NOTE — Assessment & Plan Note (Signed)
Patient had significant bilateral cerumen impactions, which were effectively removed with irrigation. The patient states that following irrigation he was able to hear much better. Exam following irrigation revealed completely clear external auditory canals and normal TMs. Patient refused the option of trying a ceruminolytic medication, and states that he requires manual irrigation for the issue.

## 2011-12-30 ENCOUNTER — Other Ambulatory Visit: Payer: Self-pay | Admitting: *Deleted

## 2011-12-30 DIAGNOSIS — I739 Peripheral vascular disease, unspecified: Secondary | ICD-10-CM

## 2011-12-30 DIAGNOSIS — Z48812 Encounter for surgical aftercare following surgery on the circulatory system: Secondary | ICD-10-CM

## 2012-01-04 IMAGING — CR DG ANKLE PORT 2V*L*
3 series · 3 of 3 positions shown · non-contrast
Comparison: 09/10/2009, 05/27/2009

CLINICAL DATA: Hardware removal for  infection.

PORTABLE LEFT ANKLE - 2 VIEW

[AP]
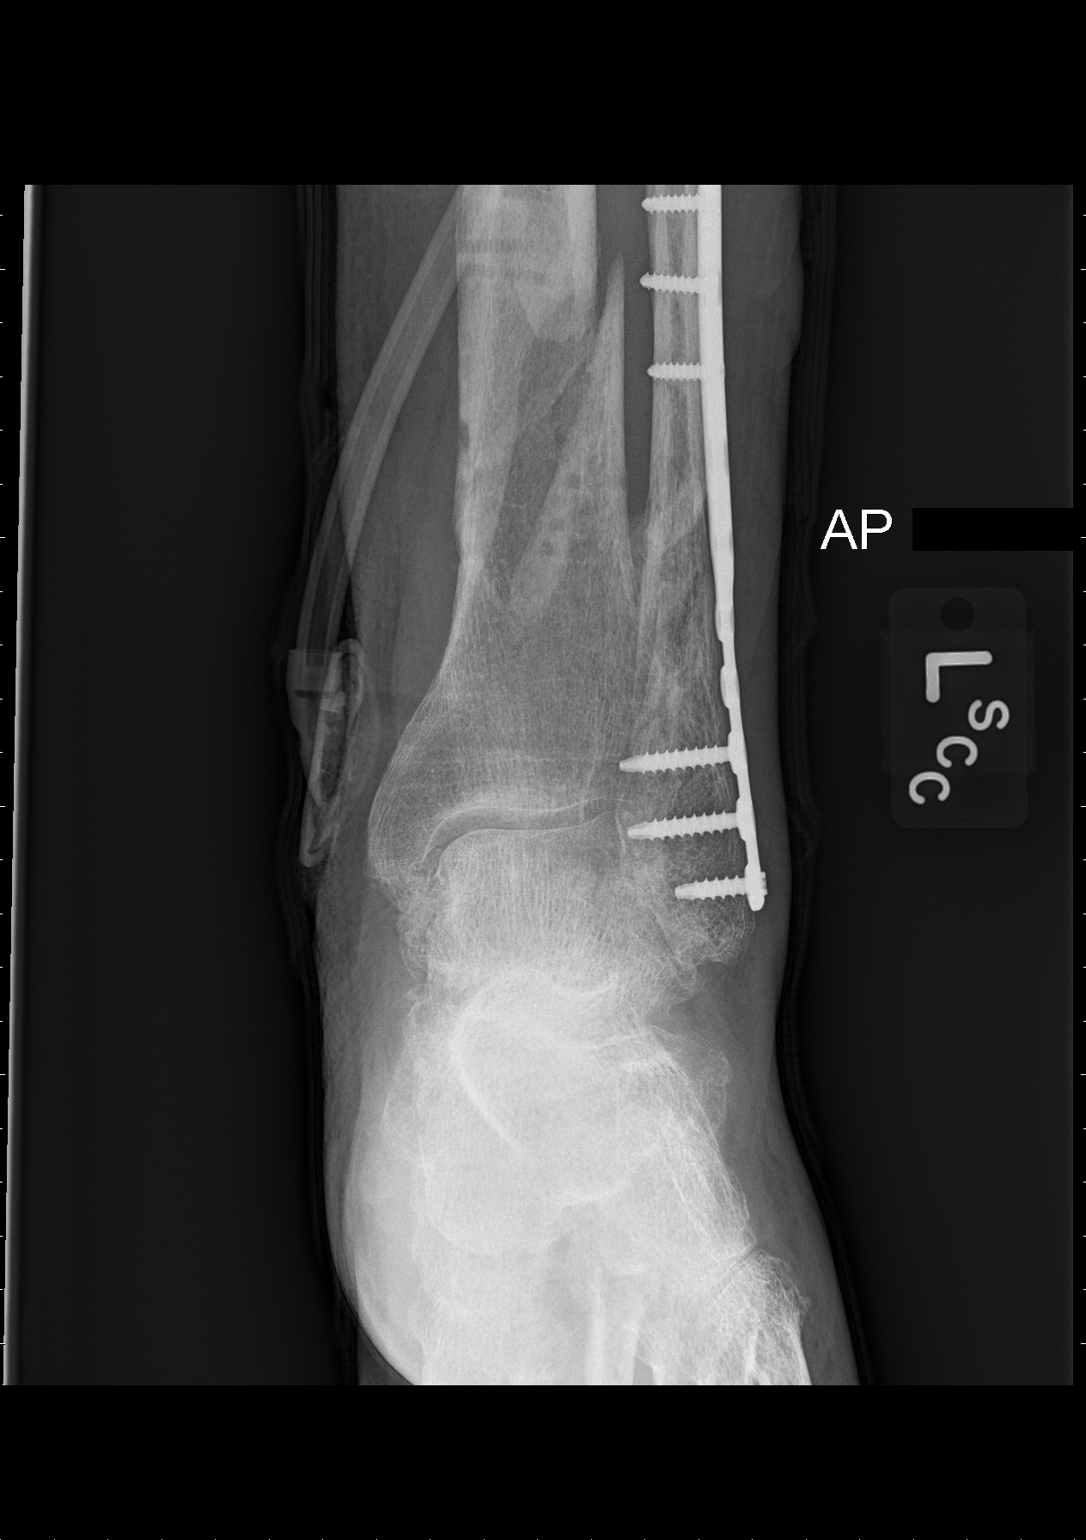

[ankle obl]
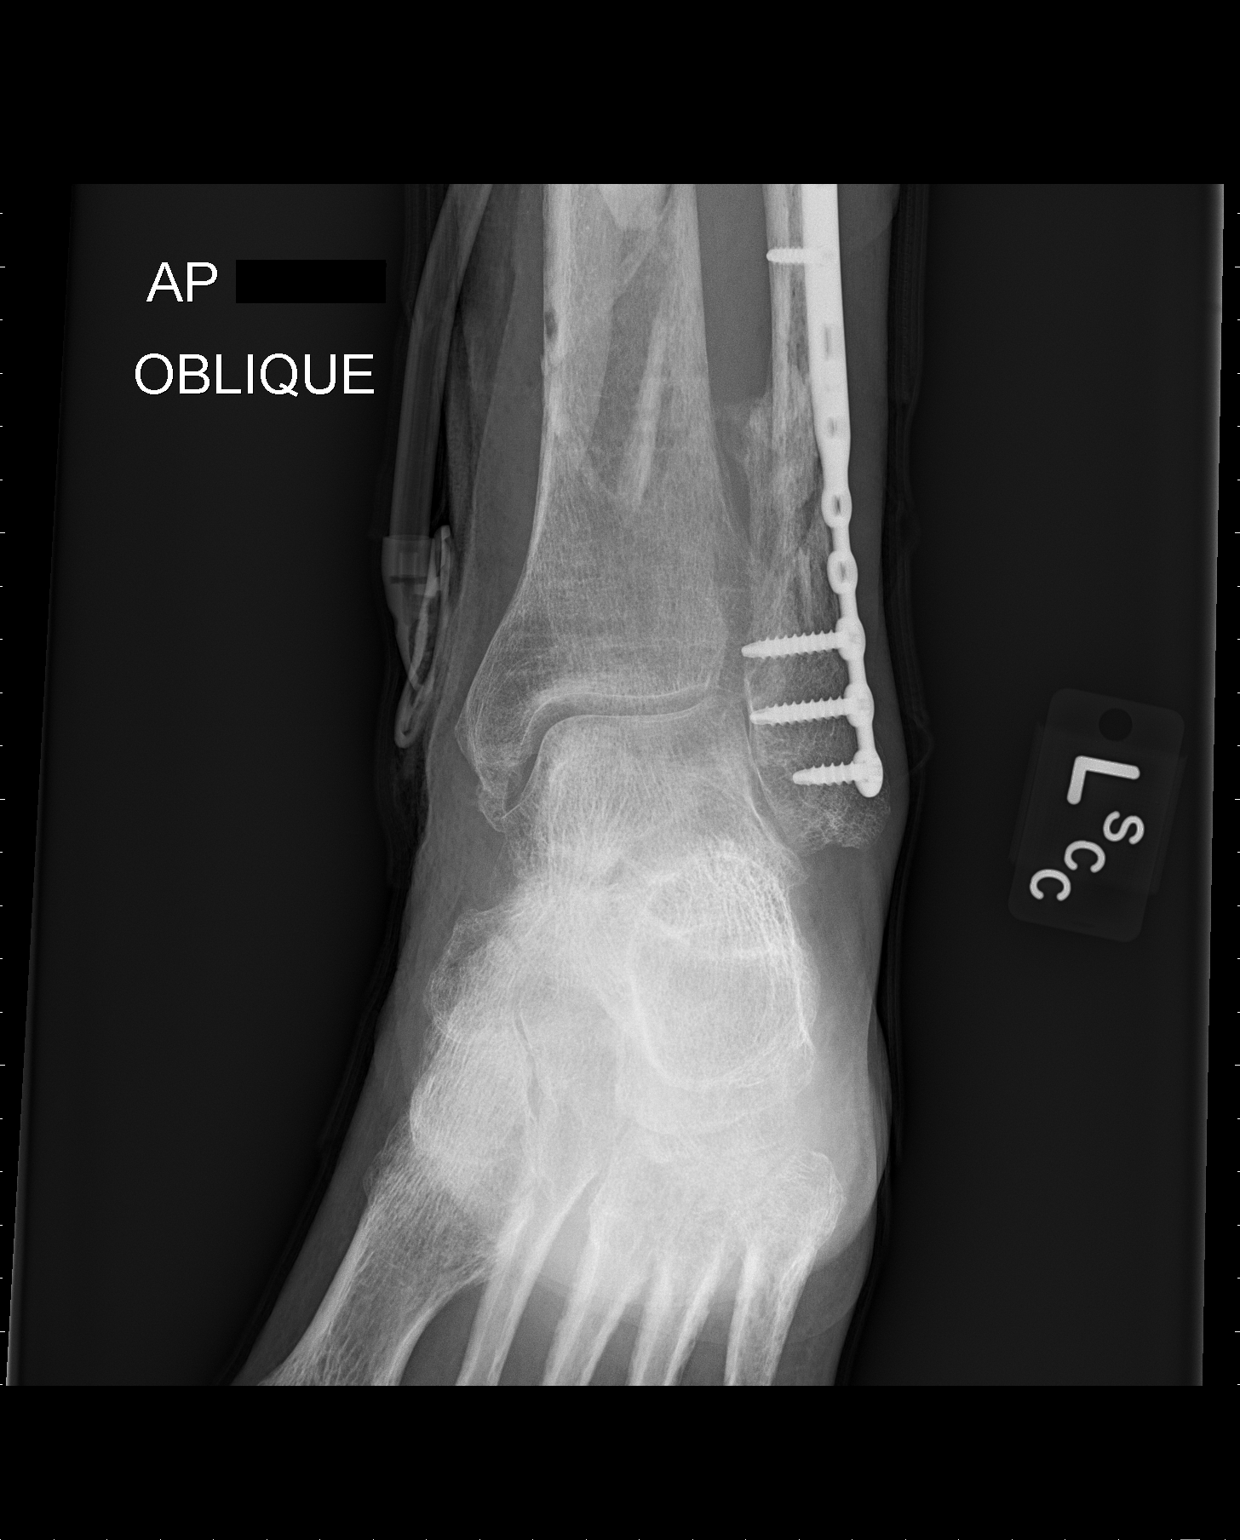

[ankle lat]
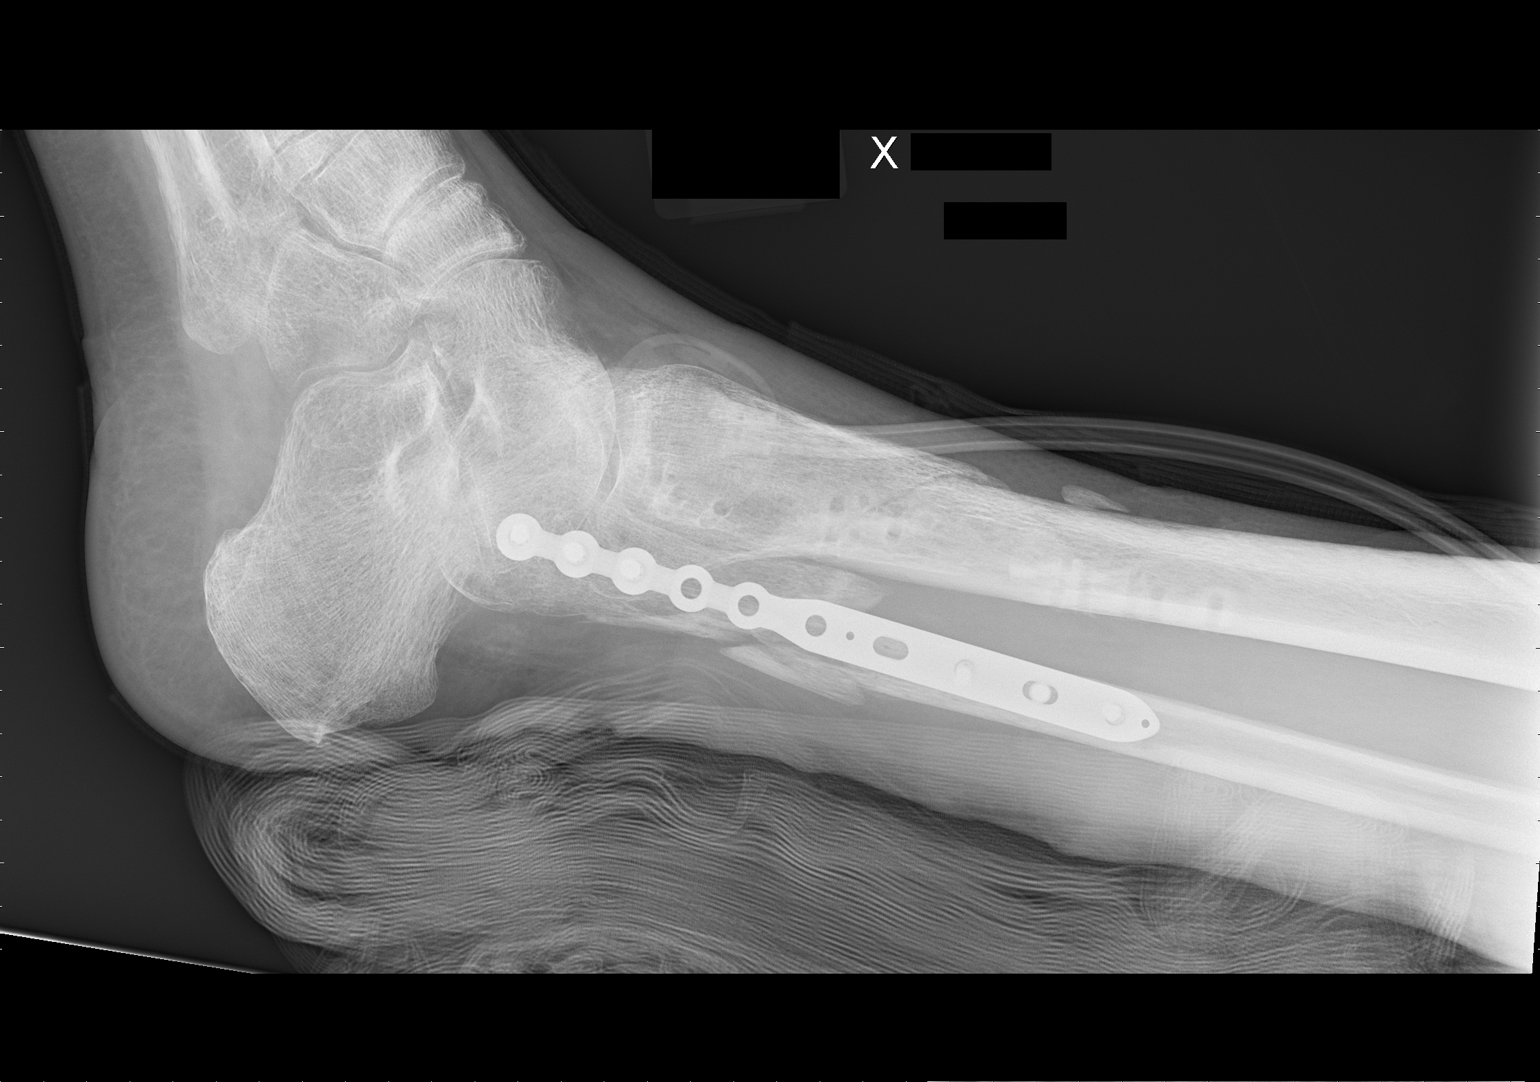

[3 of 3 positions shown; findings below may reference images not displayed]

FINDINGS: Plate and screws have been removed from the distal tibia.
No residual hardware is seen in the tibia.  Mildly displaced
fracture of distal tibia is present without evidence of significant
bony healing.

There is a plate and screws across the distal fibular fracture
which is in satisfactory alignment.  This has not healed.

There is advanced osteopenia compared with earlier studies.
Cortical lucency in the distal tibial medial cortex could be
related to prior hardware   or possibly infection.
IMPRESSION: Satisfactory tibial hardware removal.  Tibial fracture does not
show significant healing.

Fibular fracture and plate are unchanged.

## 2012-01-04 IMAGING — RF DG ANKLE 2V *L*
1 series · 2 of 2 positions shown · non-contrast
Comparison: Fluoroscopic spot view 05/27/2009

CLINICAL DATA: Left ankle hardware removal

LEFT ANKLE - 2 VIEW

[Series 1: run · 2 of 2 slices shown]
[im 1/2]
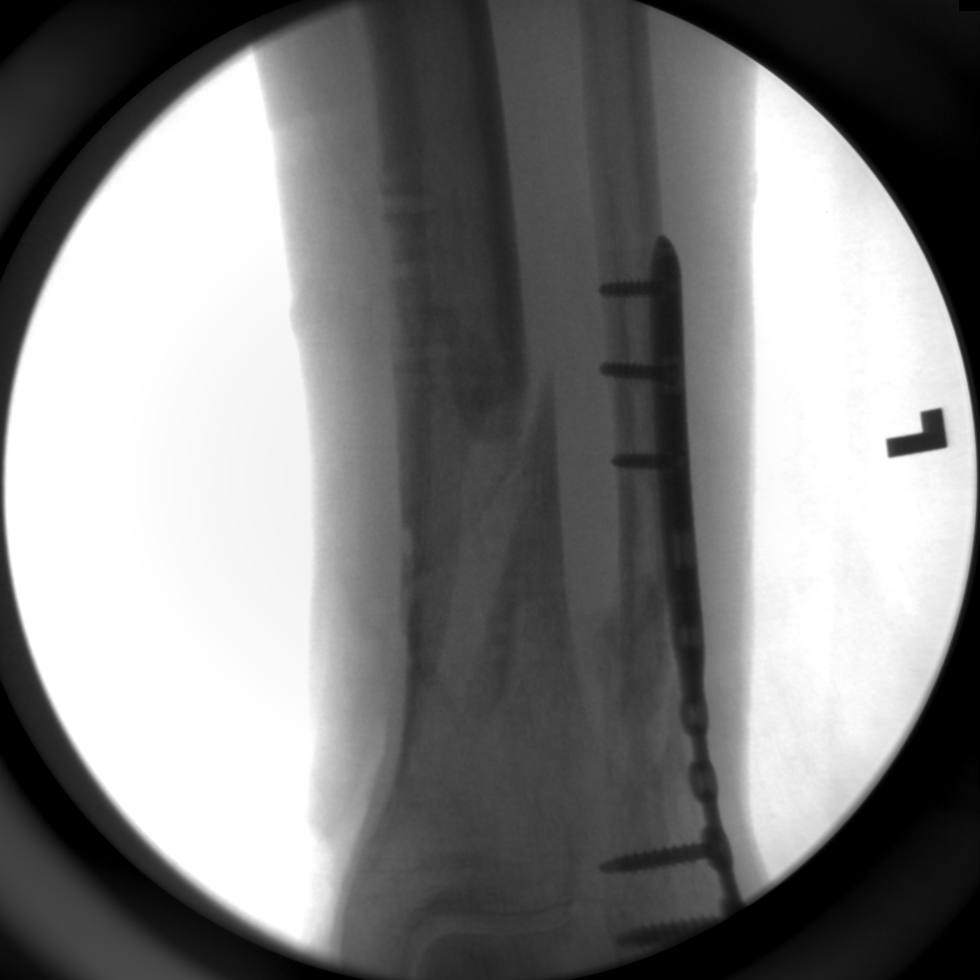
[im 2/2]
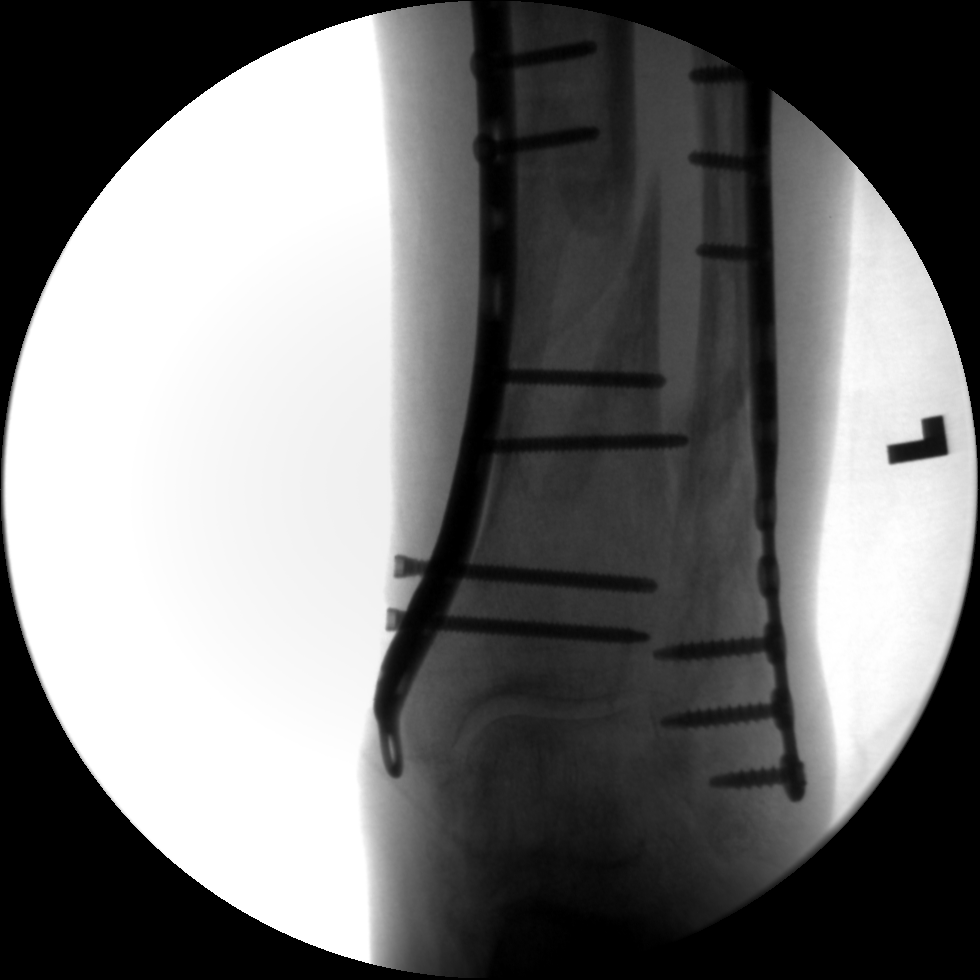

[2 of 2 positions shown; findings below may reference images not displayed]

FINDINGS: Two-view intraoperative fluoroscopic views are provided
of the left ankle.  Initial view demonstrates internal plate
fixation of the tibia and fibula.  The second image demonstrates
removal of the left tibial hardware.  There is an oblique fracture
of the distal left tibial metaphysis with a potential nonunion.
IMPRESSION: Interval removal of left tibia hardware.

## 2012-01-05 ENCOUNTER — Other Ambulatory Visit: Payer: Self-pay | Admitting: Internal Medicine

## 2012-01-26 ENCOUNTER — Observation Stay (HOSPITAL_COMMUNITY)
Admission: EM | Admit: 2012-01-26 | Discharge: 2012-01-27 | Disposition: A | Discharge status: Home / Self Care | Payer: Medicaid Other | Attending: Internal Medicine | Admitting: Internal Medicine

## 2012-01-26 ENCOUNTER — Other Ambulatory Visit: Payer: Self-pay

## 2012-01-26 ENCOUNTER — Emergency Department (HOSPITAL_COMMUNITY): Payer: Medicaid Other

## 2012-01-26 ENCOUNTER — Encounter (HOSPITAL_COMMUNITY): Payer: Self-pay | Admitting: *Deleted

## 2012-01-26 DIAGNOSIS — Z79899 Other long term (current) drug therapy: Secondary | ICD-10-CM | POA: Insufficient documentation

## 2012-01-26 DIAGNOSIS — Z7902 Long term (current) use of antithrombotics/antiplatelets: Secondary | ICD-10-CM | POA: Insufficient documentation

## 2012-01-26 DIAGNOSIS — Z720 Tobacco use: Secondary | ICD-10-CM

## 2012-01-26 DIAGNOSIS — R0602 Shortness of breath: Secondary | ICD-10-CM | POA: Insufficient documentation

## 2012-01-26 DIAGNOSIS — I70219 Atherosclerosis of native arteries of extremities with intermittent claudication, unspecified extremity: Secondary | ICD-10-CM | POA: Diagnosis present

## 2012-01-26 DIAGNOSIS — G894 Chronic pain syndrome: Secondary | ICD-10-CM | POA: Insufficient documentation

## 2012-01-26 DIAGNOSIS — R0989 Other specified symptoms and signs involving the circulatory and respiratory systems: Secondary | ICD-10-CM

## 2012-01-26 DIAGNOSIS — Z8614 Personal history of Methicillin resistant Staphylococcus aureus infection: Secondary | ICD-10-CM | POA: Insufficient documentation

## 2012-01-26 DIAGNOSIS — J449 Chronic obstructive pulmonary disease, unspecified: Secondary | ICD-10-CM

## 2012-01-26 DIAGNOSIS — R509 Fever, unspecified: Secondary | ICD-10-CM

## 2012-01-26 DIAGNOSIS — Z96669 Presence of unspecified artificial ankle joint: Secondary | ICD-10-CM | POA: Insufficient documentation

## 2012-01-26 DIAGNOSIS — K219 Gastro-esophageal reflux disease without esophagitis: Secondary | ICD-10-CM

## 2012-01-26 DIAGNOSIS — I2510 Atherosclerotic heart disease of native coronary artery without angina pectoris: Secondary | ICD-10-CM

## 2012-01-26 DIAGNOSIS — I739 Peripheral vascular disease, unspecified: Secondary | ICD-10-CM | POA: Diagnosis present

## 2012-01-26 DIAGNOSIS — R42 Dizziness and giddiness: Secondary | ICD-10-CM | POA: Insufficient documentation

## 2012-01-26 DIAGNOSIS — E119 Type 2 diabetes mellitus without complications: Secondary | ICD-10-CM | POA: Diagnosis present

## 2012-01-26 DIAGNOSIS — R05 Cough: Secondary | ICD-10-CM

## 2012-01-26 DIAGNOSIS — I6523 Occlusion and stenosis of bilateral carotid arteries: Secondary | ICD-10-CM | POA: Diagnosis present

## 2012-01-26 DIAGNOSIS — Z8673 Personal history of transient ischemic attack (TIA), and cerebral infarction without residual deficits: Secondary | ICD-10-CM | POA: Insufficient documentation

## 2012-01-26 DIAGNOSIS — J101 Influenza due to other identified influenza virus with other respiratory manifestations: Principal | ICD-10-CM | POA: Insufficient documentation

## 2012-01-26 DIAGNOSIS — G4730 Sleep apnea, unspecified: Secondary | ICD-10-CM | POA: Insufficient documentation

## 2012-01-26 DIAGNOSIS — N529 Male erectile dysfunction, unspecified: Secondary | ICD-10-CM | POA: Insufficient documentation

## 2012-01-26 DIAGNOSIS — I10 Essential (primary) hypertension: Secondary | ICD-10-CM | POA: Insufficient documentation

## 2012-01-26 DIAGNOSIS — J441 Chronic obstructive pulmonary disease with (acute) exacerbation: Secondary | ICD-10-CM | POA: Insufficient documentation

## 2012-01-26 DIAGNOSIS — IMO0002 Reserved for concepts with insufficient information to code with codable children: Secondary | ICD-10-CM | POA: Insufficient documentation

## 2012-01-26 DIAGNOSIS — J111 Influenza due to unidentified influenza virus with other respiratory manifestations: Secondary | ICD-10-CM

## 2012-01-26 DIAGNOSIS — R55 Syncope and collapse: Secondary | ICD-10-CM

## 2012-01-26 DIAGNOSIS — M5126 Other intervertebral disc displacement, lumbar region: Secondary | ICD-10-CM | POA: Diagnosis present

## 2012-01-26 DIAGNOSIS — E785 Hyperlipidemia, unspecified: Secondary | ICD-10-CM | POA: Insufficient documentation

## 2012-01-26 DIAGNOSIS — I6529 Occlusion and stenosis of unspecified carotid artery: Secondary | ICD-10-CM | POA: Insufficient documentation

## 2012-01-26 LAB — COMPREHENSIVE METABOLIC PANEL
Albumin: 4.2 g/dL (ref 3.5–5.2)
BUN: 12 mg/dL (ref 6–23)
CO2: 23 mEq/L (ref 19–32)
Chloride: 99 mEq/L (ref 96–112)
Creatinine, Ser: 0.88 mg/dL (ref 0.50–1.35)
GFR calc non Af Amer: >90 mL/min (ref >90)
Total Bilirubin: 0.5 mg/dL (ref 0.3–1.2)

## 2012-01-26 LAB — CBC WITH DIFFERENTIAL/PLATELET
Basophils Relative: 0 % (ref 0–1)
HCT: 44.1 % (ref 39.0–52.0)
Hemoglobin: 15.7 g/dL (ref 13.0–17.0)
MCH: 32.4 pg (ref 26.0–34.0)
MCHC: 35.6 g/dL (ref 30.0–36.0)
MCV: 90.9 fL (ref 78.0–100.0)
Monocytes Absolute: 0.6 10*3/uL (ref 0.1–1.0)
Monocytes Relative: 8 % (ref 3–12)
Neutro Abs: 6.1 10*3/uL (ref 1.7–7.7)

## 2012-01-26 LAB — URINALYSIS, ROUTINE W REFLEX MICROSCOPIC
Leukocytes, UA: NEGATIVE
Nitrite: NEGATIVE
Protein, ur: NEGATIVE mg/dL
Urobilinogen, UA: 0.2 mg/dL (ref 0.0–1.0)

## 2012-01-26 MED ORDER — HYDROMORPHONE HCL PF 1 MG/ML IJ SOLN
1.0000 mg | Freq: Once | INTRAMUSCULAR | Status: AC
  Administered 2012-01-26: 1 mg via INTRAVENOUS
  Filled 2012-01-26: qty 1

## 2012-01-26 MED ORDER — ALBUTEROL SULFATE (5 MG/ML) 0.5% IN NEBU
2.5000 mg | INHALATION_SOLUTION | RESPIRATORY_TRACT | Status: DC
  Administered 2012-01-26 (×2): 2.5 mg via RESPIRATORY_TRACT
  Filled 2012-01-26 (×2): qty 20

## 2012-01-26 MED ORDER — ALBUTEROL SULFATE HFA 108 (90 BASE) MCG/ACT IN AERS
2.0000 | INHALATION_SPRAY | Freq: Once | RESPIRATORY_TRACT | Status: AC
  Administered 2012-01-26: 2 via RESPIRATORY_TRACT
  Filled 2012-01-26: qty 6.7

## 2012-01-26 MED ORDER — DEXTROSE 5 % IV SOLN
500.0000 mg | Freq: Once | INTRAVENOUS | Status: AC
  Administered 2012-01-26: 500 mg via INTRAVENOUS
  Filled 2012-01-26: qty 500

## 2012-01-26 MED ORDER — MECLIZINE HCL 25 MG PO TABS
25.0000 mg | ORAL_TABLET | Freq: Once | ORAL | Status: AC
  Administered 2012-01-26: 25 mg via ORAL
  Filled 2012-01-26: qty 1

## 2012-01-26 MED ORDER — IPRATROPIUM BROMIDE 0.02 % IN SOLN
0.5000 mg | RESPIRATORY_TRACT | Status: DC
  Administered 2012-01-26 (×2): 0.5 mg via RESPIRATORY_TRACT
  Filled 2012-01-26 (×2): qty 2.5

## 2012-01-26 MED ORDER — PREDNISONE 20 MG PO TABS
60.0000 mg | ORAL_TABLET | Freq: Once | ORAL | Status: AC
  Administered 2012-01-26: 60 mg via ORAL
  Filled 2012-01-26: qty 3

## 2012-01-26 MED ORDER — OXYCODONE-ACETAMINOPHEN 5-325 MG PO TABS
1.0000 | ORAL_TABLET | Freq: Four times a day (QID) | ORAL | Status: DC | PRN
  Administered 2012-01-26: 1 via ORAL
  Filled 2012-01-26: qty 1
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
## 2012-01-26 NOTE — ED Notes (Signed)
Pt is here with near syncopal episodes over the last week preceded by dizziness.  Pt now reporting fever with lower back pain.  Pt was told about one month ago that he had arthritis in his back.  Pt is having sever pain in left foot from previous nerve damage.  No chest pain or shortness of breath.  Pt is diabetic.

## 2012-01-26 NOTE — ED Notes (Signed)
Dr. Earlene Plater in at bedside consulting with patient

## 2012-01-26 NOTE — ED Notes (Signed)
Pt has urinal, stated that he would try to give a urine sample
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
## 2012-01-27 DIAGNOSIS — J111 Influenza due to unidentified influenza virus with other respiratory manifestations: Secondary | ICD-10-CM

## 2012-01-27 DIAGNOSIS — R42 Dizziness and giddiness: Secondary | ICD-10-CM

## 2012-01-27 DIAGNOSIS — J441 Chronic obstructive pulmonary disease with (acute) exacerbation: Secondary | ICD-10-CM

## 2012-01-27 LAB — CBC
Platelets: 194 10*3/uL (ref 150–400)
RBC: 4.34 MIL/uL (ref 4.22–5.81)
RDW: 13.1 % (ref 11.5–15.5)
WBC: 7.3 10*3/uL (ref 4.0–10.5)

## 2012-01-27 LAB — BASIC METABOLIC PANEL
CO2: 24 mEq/L (ref 19–32)
Chloride: 101 mEq/L (ref 96–112)
Creatinine, Ser: 0.87 mg/dL (ref 0.50–1.35)
GFR calc Af Amer: >90 mL/min (ref >90)
Sodium: 136 mEq/L (ref 135–145)

## 2012-01-27 LAB — INFLUENZA PANEL BY PCR (TYPE A & B)
H1N1 flu by pcr: DETECTED — AB
Influenza A By PCR: POSITIVE — AB
Influenza B By PCR: NEGATIVE

## 2012-01-27 LAB — GLUCOSE, CAPILLARY
Glucose-Capillary: 135 mg/dL — ABNORMAL HIGH (ref 70–99)
Glucose-Capillary: 235 mg/dL — ABNORMAL HIGH (ref 70–99)

## 2012-01-27 MED ORDER — OSELTAMIVIR PHOSPHATE 75 MG PO CAPS: 75.0000 mg | ORAL_CAPSULE | Freq: Two times a day (BID) | ORAL | Status: AC

## 2012-01-27 MED ORDER — OXYCODONE-ACETAMINOPHEN 5-325 MG PO TABS
1.0000 | ORAL_TABLET | Freq: Four times a day (QID) | ORAL | Status: DC | PRN
  Administered 2012-01-27 (×2): 2 via ORAL
  Filled 2012-01-27 (×2): qty 2

## 2012-01-27 MED ORDER — FAMOTIDINE 20 MG PO TABS
20.0000 mg | ORAL_TABLET | Freq: Two times a day (BID) | ORAL | Status: DC
  Administered 2012-01-27: 20 mg via ORAL
  Filled 2012-01-27 (×3): qty 1

## 2012-01-27 MED ORDER — ONDANSETRON HCL 4 MG/2ML IJ SOLN: 4.0000 mg | Freq: Four times a day (QID) | INTRAMUSCULAR | Status: DC | PRN

## 2012-01-27 MED ORDER — HYDROXYZINE HCL 25 MG PO TABS: 25.0000 mg | ORAL_TABLET | Freq: Two times a day (BID) | ORAL | Status: DC | PRN

## 2012-01-27 MED ORDER — ENOXAPARIN SODIUM 40 MG/0.4ML ~~LOC~~ SOLN
40.0000 mg | SUBCUTANEOUS | Status: DC
  Administered 2012-01-27: 40 mg via SUBCUTANEOUS
  Filled 2012-01-27 (×3): qty 0.4

## 2012-01-27 MED ORDER — SODIUM CHLORIDE 0.9 % IJ SOLN
3.0000 mL | Freq: Two times a day (BID) | INTRAMUSCULAR | Status: DC
  Administered 2012-01-27 (×2): 3 mL via INTRAVENOUS

## 2012-01-27 MED ORDER — ASPIRIN 81 MG PO TBEC: 81.0000 mg | DELAYED_RELEASE_TABLET | Freq: Every day | ORAL | Status: DC

## 2012-01-27 MED ORDER — PREDNISONE 20 MG PO TABS: 40.0000 mg | ORAL_TABLET | Freq: Every day | ORAL | Status: DC

## 2012-01-27 MED ORDER — CILOSTAZOL 100 MG PO TABS: 100.0000 mg | ORAL_TABLET | Freq: Two times a day (BID) | ORAL | Status: DC

## 2012-01-27 MED ORDER — ASPIRIN 81 MG PO CHEW
81.0000 mg | CHEWABLE_TABLET | Freq: Every day | ORAL | Status: DC
  Administered 2012-01-27: 81 mg via ORAL
  Filled 2012-01-27: qty 1

## 2012-01-27 MED ORDER — NICOTINE 21 MG/24HR TD PT24
21.0000 mg | MEDICATED_PATCH | Freq: Every day | TRANSDERMAL | Status: DC
  Administered 2012-01-27: 21 mg via TRANSDERMAL
  Filled 2012-01-27: qty 1

## 2012-01-27 MED ORDER — ATENOLOL 50 MG PO TABS
50.0000 mg | ORAL_TABLET | Freq: Every day | ORAL | Status: DC
  Administered 2012-01-27: 50 mg via ORAL
  Filled 2012-01-27: qty 1

## 2012-01-27 MED ORDER — DOXYCYCLINE HYCLATE 100 MG PO TABS: 100.0000 mg | ORAL_TABLET | Freq: Two times a day (BID) | ORAL | Status: DC

## 2012-01-27 MED ORDER — ALBUTEROL SULFATE HFA 108 (90 BASE) MCG/ACT IN AERS
2.0000 | INHALATION_SPRAY | RESPIRATORY_TRACT | Status: DC | PRN
  Filled 2012-01-27: qty 6.7

## 2012-01-27 MED ORDER — CLOPIDOGREL BISULFATE 75 MG PO TABS
75.0000 mg | ORAL_TABLET | Freq: Every day | ORAL | Status: DC
  Administered 2012-01-27: 75 mg via ORAL

## 2012-01-27 MED ORDER — LISINOPRIL 5 MG PO TABS
5.0000 mg | ORAL_TABLET | Freq: Every day | ORAL | Status: DC
  Administered 2012-01-27: 5 mg via ORAL
  Filled 2012-01-27: qty 1

## 2012-01-27 MED ORDER — FENOFIBRATE 160 MG PO TABS
160.0000 mg | ORAL_TABLET | Freq: Every day | ORAL | Status: DC
  Administered 2012-01-27: 160 mg via ORAL
  Filled 2012-01-27: qty 1

## 2012-01-27 MED ORDER — CILOSTAZOL 100 MG PO TABS
100.0000 mg | ORAL_TABLET | Freq: Two times a day (BID) | ORAL | Status: DC
  Administered 2012-01-27 (×2): 100 mg via ORAL
  Filled 2012-01-27 (×3): qty 1

## 2012-01-27 MED ORDER — ONDANSETRON HCL 4 MG PO TABS: 4.0000 mg | ORAL_TABLET | Freq: Four times a day (QID) | ORAL | Status: DC | PRN

## 2012-01-27 MED ORDER — ATORVASTATIN CALCIUM 40 MG PO TABS
40.0000 mg | ORAL_TABLET | Freq: Every day | ORAL | Status: DC
  Administered 2012-01-27: 40 mg via ORAL
  Filled 2012-01-27 (×2): qty 1

## 2012-01-27 NOTE — Progress Notes (Signed)
Pt provided with dc instructions and education. Pt verbalized understanding. Pt has no questions at this time. Medications called in to home pharmacy. VSS IV removed with tip intact. Heart monitor cleaned and returned to front. Levonne Spiller, RN

## 2012-01-27 NOTE — Progress Notes (Signed)
Subjective:    Patient states dizziness is completely resolved. He requests pain medications for chronic back pain, but was informed that as he has violated his pain contract in the clinic this is not an option. Pt is subsequently very anxious to be discharged home, and denies any other complaints this AM.  Interval Events: No acute events. Called by nursing after seeing pt this AM and informed that pt is pacing the hall as he wants to go home and no longer has any complaints.    Objective:    Vital Signs:   Temp:  [97.3 F (36.3 C)-99.2 F (37.3 C)] 97.3 F (36.3 C) (01/15 0606) Pulse Rate:  [63-95] 64  (01/15 0953) Resp:  [18-26] 20  (01/15 0606) BP: (104-142)/(57-91) 110/60 mmHg (01/15 0953) SpO2:  [93 %-98 %] 96 % (01/15 0606) Weight:  [168 lb 12.8 oz (76.567 kg)] 168 lb 12.8 oz (76.567 kg) (01/15 0606) Last BM Date: 01/26/12  24-hour weight change: Weight change:   Intake/Output:   Intake/Output Summary (Last 24 hours) at 01/27/12 1313 Last data filed at 01/27/12 0500  Gross per 24 hour  Intake    480 ml  Output    500 ml  Net    -20 ml      Physical Exam: General: Vital signs reviewed and noted. Well-developed, well-nourished, in no acute distress; alert, appropriate and cooperative throughout examination.  Lungs:  Normal respiratory effort. Clear to auscultation BL without crackles or wheezes.  Heart: RRR. S1 and S2 normal without gallop, murmur, or rubs.  Abdomen:  BS normoactive. Soft, Nondistended, non-tender.  No masses or organomegaly.  Extremities: No pretibial edema.     Labs:  Basic Metabolic Panel:  Lab 01/27/12 1610 01/26/12 1433  NA 136 135  K 3.7 3.8  CL 101 99  CO2 24 23  GLUCOSE 109* 169*  BUN 17 12  CREATININE 0.87 0.88  CALCIUM 9.1 9.6  MG -- --  PHOS -- --    Liver Function Tests:  Lab 01/26/12 1433  AST 24  ALT 25  ALKPHOS 81  BILITOT 0.5  PROT 7.9  ALBUMIN 4.2   CBC:  Lab 01/27/12 1003 01/26/12 1433  WBC 7.3 7.6    NEUTROABS -- 6.1  HGB 13.9 15.7  HCT 39.5 44.1  MCV 91.0 90.9  PLT 194 195   CBG:  Lab 01/27/12 0741 01/27/12 0033  GLUCAP 135* 235*    Microbiology: Results for orders placed during the hospital encounter of 01/26/12  MRSA PCR SCREENING     Status: Normal   Collection Time   01/27/12  6:44 AM      Component Value Range Status Comment   MRSA by PCR NEGATIVE  NEGATIVE Final     Dg Chest 2 View  01/26/2012  *RADIOLOGY REPORT*  Clinical Data: Dizziness, fever, cough, smoker  CHEST - 2 VIEW  Comparison: 08/07/2011, 11/22/2011  Findings: Borderline cardiomegaly with central vascular and interstitial prominence.  Chronic central bronchitic changes as before.  Prominent basilar vascular markings.  No definite focal pneumonia, collapse, consolidation, effusion or pneumothorax. Healed left upper rib fractures with chest wall deformity.  Lower cervical fusion hardware noted.  Trachea is midline.  IMPRESSION: Stable exam with slight vascular and interstitial prominence.  Chronic bronchitic changes.   Original Report Authenticated By: Judie Petit. Miles Costain, M.D.    Ct Head Wo Contrast  01/26/2012  *RADIOLOGY REPORT*  Clinical Data: Dizziness  CT HEAD WITHOUT CONTRAST  Technique:  Contiguous axial images were obtained  from the base of the skull through the vertex without contrast.  Comparison: MRI head 10/21/2011  Findings: Mild atrophy.  Negative for hydrocephalus.  No acute infarct or mass.  Negative for intracranial hemorrhage.  No acute skull abnormality. Atherosclerotic calcification is present in the carotid and vertebral arteries.  IMPRESSION: Mild atrophy without acute intracranial abnormality.   Original Report Authenticated By: Janeece Riggers, M.D.        Medications:    Infusions:    Scheduled Medications:    . aspirin  81 mg Oral Daily  . atenolol  50 mg Oral Daily  . atorvastatin  40 mg Oral QHS  . cilostazol  100 mg Oral BID  . clopidogrel  75 mg Oral Daily  . enoxaparin (LOVENOX)  injection  40 mg Subcutaneous Q24H  . famotidine  20 mg Oral BID  . fenofibrate  160 mg Oral Daily  . lisinopril  5 mg Oral Daily  . nicotine  21 mg Transdermal Daily  . sodium chloride  3 mL Intravenous Q12H    PRN Medications: albuterol, ondansetron (ZOFRAN) IV, ondansetron   Assessment/ Plan:   Influenza - patient is positive for influenza A (H1N1) despite being vaccinated for influenza in 10/2011. Symptoms are mild at this time and he has no complaints this AM. - tamiflu 75mg  bid x 5 days at discharge  COPD exacerbation - likely 2/2 influenza infection. No evidence of PNA on CXR. MRSA negative. Will treat as severe exacerbation as he was hospitalized, despite not having an impressive presentation in terms of severity of cardinal symptoms - prednisone 40mg  x 5 days - doxy x 5 days  Vertigo - unclear if patient has vertigo, as he states his symptoms have resolved. As he is asymptomatic, no new medications will be given at discharge (particularly as pt is a known heroin/opioid user and there is concern for over-sedation with typical medications prescribed for vertigo. Pt had carotid dopplers performed this AM which revealed no flow limiting stenosis in the R ICA and unchanged occlusion of the L ICA with antegrade vertebral flow bilaterally (unchanged since 03/2011).  - d/c home with no changes in meds - f/u with PCP for referral to vascular surgery for carotid stenosis   R carotid stenosis - unchanged, per above, and highly unlikely to be contributory to presentation.  - f/u with PCP for referral to vascular surgery     DISPO - Patient is feeling well and will be d/c'd home today.   The patient does have a current [facility] PCP (BROWN, Alycia Rossetti, MD) and does need an [facility] hospital follow-up appointment after discharge.    Is the [facility] hospital follow-up appointment a one-time only appointment? not applicable.  Does the patient have transportation limitations that hinder transportation to clinic  appointments? no   SERVICE NEEDED AT DISCHARGE - TO BE DETERMINED DURING HOSPITAL COURSE         Y = Yes, Blank = No PT:   OT:   RN:   Equipment:   Other:      Length of Stay: 1 day(s)   Signed: Elfredia Nevins, MD  PGY-1, Internal Medicine Resident Pager: [phone] (7AM-5PM) 01/27/2012, 1:13 PM

## 2012-01-27 NOTE — H&P (Signed)
INTERNAL MEDICINE TEACHING SERVICE Attending Admission Note  Date: 01/27/2012  Patient name: [name]  Medical record number: [id number]  Date of birth: 1960-06-09    I have seen and evaluated [name] and discussed their care with the Residency Team.  56 yr. Old male with hx PVD of LE and carotid arteries, DM, HL, HTN, COPD, tobacco abuse (not interested in quitting), presented with dizziness. He admitted to dizziness occurring on and off with changes in position.  He described it as "spinning in his head".  When dizziness occurs, it usually lasts a few seconds.  He also complained of some increased SOB, nonproductive cough, and a runny nose.  He admits this morning the dizziness resolves and he has been able to ambulate without difficulty. He is eager to go home this morning. He was noted to have +H1N1 flu this morning.  Physical Exam: Blood pressure 110/60, pulse 64, temperature 97.3 F (36.3 C), temperature source Oral, resp. rate 20, height 6\' 1"  (1.854 m), weight 168 lb 12.8 oz (76.567 kg), SpO2 96.00%.  General: Vital signs reviewed and noted. Well-developed, well-nourished, in no acute distress; alert, appropriate and cooperative throughout examination.  Head: Normocephalic, atraumatic.  Eyes: PERRL, EOMI, No signs of anemia or jaundince.  Nose: Mucous membranes moist, not inflammed, nonerythematous.  Throat: Oropharynx nonerythematous, no exudate appreciated.   Neck: No deformities, masses, or tenderness noted.Supple, No carotid Bruits, no JVD.  Lungs:  Normal respiratory effort. Clear to auscultation BL without crackles or wheezes.  Heart: RRR. S1 and S2 normal without gallop, murmur, or rubs.  Abdomen:  BS normoactive. Soft, Nondistended, non-tender.  No masses or organomegaly.  Extremities: No pretibial edema, 1/4 pulses bilat.  Neurologic: A&O X3, CN II - XII are grossly intact. Motor strength is 5/5 in the all 4 extremities, Sensations intact to light touch,  Cerebellar signs negative. He has evidence of nystagmus with epley test.   Skin: No visible rashes, scars.    Lab results: Results for orders placed during the hospital encounter of 01/26/12 (from the past 24 hour(s))  URINALYSIS, ROUTINE W REFLEX MICROSCOPIC     Status: Abnormal   Collection Time   01/26/12  8:08 PM      Component Value Range   Color, Urine YELLOW  YELLOW   APPearance HAZY (*) CLEAR   Specific Gravity, Urine 1.029  1.005 - 1.030   pH 7.0  5.0 - 8.0   Glucose, UA NEGATIVE  NEGATIVE mg/dL   Hgb urine dipstick NEGATIVE  NEGATIVE   Bilirubin Urine NEGATIVE  NEGATIVE   Ketones, ur NEGATIVE  NEGATIVE mg/dL   Protein, ur NEGATIVE  NEGATIVE mg/dL   Urobilinogen, UA 0.2  0.0 - 1.0 mg/dL   Nitrite NEGATIVE  NEGATIVE   Leukocytes, UA NEGATIVE  NEGATIVE  INFLUENZA PANEL BY PCR     Status: Abnormal   Collection Time   01/26/12 10:53 PM      Component Value Range   Influenza A By PCR POSITIVE (*) NEGATIVE   Influenza B By PCR NEGATIVE  NEGATIVE   H1N1 flu by pcr DETECTED (*) NOT DETECTED  GLUCOSE, CAPILLARY     Status: Abnormal   Collection Time   01/27/12 12:33 AM      Component Value Range   Glucose-Capillary 235 (*) 70 - 99 mg/dL   Comment 1 Notify RN    MRSA PCR SCREENING     Status: Normal   Collection Time   01/27/12  6:44 AM  Component Value Range   MRSA by PCR NEGATIVE  NEGATIVE  GLUCOSE, CAPILLARY     Status: Abnormal   Collection Time   01/27/12  7:41 AM      Component Value Range   Glucose-Capillary 135 (*) 70 - 99 mg/dL  CBC     Status: Normal   Collection Time   01/27/12 10:03 AM      Component Value Range   WBC 7.3  4.0 - 10.5 K/uL   RBC 4.34  4.22 - 5.81 MIL/uL   Hemoglobin 13.9  13.0 - 17.0 g/dL   HCT 16.1  09.6 - 04.5 %   MCV 91.0  78.0 - 100.0 fL   MCH 32.0  26.0 - 34.0 pg   MCHC 35.2  30.0 - 36.0 g/dL   RDW 40.9  81.1 - 91.4 %   Platelets 194  150 - 400 K/uL  BASIC METABOLIC PANEL     Status: Abnormal   Collection Time   01/27/12 10:03  AM      Component Value Range   Sodium 136  135 - 145 mEq/L   Potassium 3.7  3.5 - 5.1 mEq/L   Chloride 101  96 - 112 mEq/L   CO2 24  19 - 32 mEq/L   Glucose, Bld 109 (*) 70 - 99 mg/dL   BUN 17  6 - 23 mg/dL   Creatinine, Ser 7.82  0.50 - 1.35 mg/dL   Calcium 9.1  8.4 - 95.6 mg/dL   GFR calc non Af Amer >90  >90 mL/min   GFR calc Af Amer >90  >90 mL/min    Imaging results:  Dg Chest 2 View  01/26/2012  *RADIOLOGY REPORT*  Clinical Data: Dizziness, fever, cough, smoker  CHEST - 2 VIEW  Comparison: 08/07/2011, 11/22/2011  Findings: Borderline cardiomegaly with central vascular and interstitial prominence.  Chronic central bronchitic changes as before.  Prominent basilar vascular markings.  No definite focal pneumonia, collapse, consolidation, effusion or pneumothorax. Healed left upper rib fractures with chest wall deformity.  Lower cervical fusion hardware noted.  Trachea is midline.  IMPRESSION: Stable exam with slight vascular and interstitial prominence.  Chronic bronchitic changes.   Original Report Authenticated By: Judie Petit. Miles Costain, M.D.    Ct Head Wo Contrast  01/26/2012  *RADIOLOGY REPORT*  Clinical Data: Dizziness  CT HEAD WITHOUT CONTRAST  Technique:  Contiguous axial images were obtained from the base of the skull through the vertex without contrast.  Comparison: MRI head 10/21/2011  Findings: Mild atrophy.  Negative for hydrocephalus.  No acute infarct or mass.  Negative for intracranial hemorrhage.  No acute skull abnormality. Atherosclerotic calcification is present in the carotid and vertebral arteries.  IMPRESSION: Mild atrophy without acute intracranial abnormality.   Original Report Authenticated By: Janeece Riggers, M.D.      Assessment and Plan: I agree with the formulated Assessment and Plan with the following changes:  -BPPV: PT this morning, resolved. He will need PCP follow up. I do not think this is a neurological event, although given findings of L ICA blockage and R ICA  stenosis, he will need vascular follow up. -H1N1 flu: treat with tamiflu for 5 days. He looks clinically well. Due to his hx of COPD and increased SOB, treat with course of prednisone and doxycycline. May D/C home today. Needs PCP f/u.  Donata Duff, [place] 1/15/20143:45 PM

## 2012-01-27 NOTE — Evaluation (Signed)
Physical Therapy Evaluation Patient Details Name: [name] MRN: [id number] DOB: Jun 03, 1960 Today's Date: 01/27/2012 Time: 4098-1191 PT Time Calculation (min): 15 min  PT Assessment / Plan / Recommendation Clinical Impression  Patient positive for left posterior canal BPPV and demonstrated improvement in symptoms after canalith repositioning.  No further acute skilled PT needs at this time.  Has necessary referral form if needs outpatient follow up and encouraged to contact PCP to sign if needed.    PT Assessment  Patent does not need any further PT services    Follow Up Recommendations  No PT follow up                Equipment Recommendations  None recommended by PT                    Pertinent Vitals/Pain Denies pain during treatment      Mobility  Bed Mobility Bed Mobility: Rolling Right;Right Sidelying to Sit;Sit to Supine Rolling Right: 7: Independent Right Sidelying to Sit: 7: Independent Sit to Supine: 7: Independent Transfers Transfers: Stand to Sit;Sit to Stand Sit to Stand: 7: Independent Stand to Sit: 7: Independent Ambulation/Gait Ambulation/Gait Assistance: 7: Independent Ambulation Distance (Feet): 15 Feet Assistive device: None Ambulation/Gait Assistance Details: ambulated in room only; patient up upon my entry in room         Exercises Other Exercises Other Exercises: Modified Dix hallpike to  right (left ear down) performed with positive nystagmus and symptoms of spinning approx 15 seconds duration Other Exercises: Performed Eply's for canalith repositioning for left posterior canal BPPV. Repeat dix hallpike negative for symptoms Other Exercises: Educated patient possibiltiy of recurrance and gave outpatient rehab referral form to take to MD if needed.       Visit Information  Last PT Received On: 01/27/12    Subjective Data  Subjective: I feel okay Patient Stated Goal: To go home   Prior Functioning  Home Living Home Adaptive  Equipment: Straight cane Prior Function Level of Independence: Independent with assistive device(s)    Cognition  Overall Cognitive Status: Appears within functional limits for tasks assessed/performed Arousal/Alertness: Awake/alert Behavior During Session: [facility] for tasks performed    Extremity/Trunk Assessment Right Lower Extremity Assessment RLE ROM/Strength/Tone: [facility] for tasks assessed Left Lower Extremity Assessment LLE ROM/Strength/Tone: [facility] for tasks assessed   Balance Balance Balance Assessed: Yes Dynamic Standing Balance Dynamic Standing - Balance Support: No upper extremity supported Dynamic Standing - Level of Assistance: 7: Independent Dynamic Standing - Balance Activities: Forward lean/weight shifting Dynamic Standing - Comments: while performing functional activities reaching to move items off bed  End of Session PT - End of Session Activity Tolerance: Patient tolerated treatment well Patient left: in bed  GP Functional Assessment Tool Used: Clinical judgement Functional Limitation: Mobility: Walking and moving around Mobility: Walking and Moving Around Current Status (Y7829): At least 1 percent but less than 20 percent impaired, limited or restricted Mobility: Walking and Moving Around Goal Status [phone]): 0 percent impaired, limited or restricted Mobility: Walking and Moving Around Discharge Status [phone]): 0 percent impaired, limited or restricted   [facility] 01/27/2012, 12:34 PM  Sheran Lawless, PT [phone] 01/27/2012

## 2012-01-27 NOTE — Progress Notes (Signed)
VASCULAR LAB PRELIMINARY  PRELIMINARY  PRELIMINARY  PRELIMINARY  Carotid duplex  completed.    Preliminary report:  Right:  No evidence of hemodynamically significant internal carotid artery stenosis.  Left:  Occluded internal carotid artery.  Bilateral:  Vertebral artery flow is antegrade.   No significant change since last study.  Dshaun Reppucci, RVT 01/27/2012, 10:14 AM

## 2012-01-27 NOTE — ED Provider Notes (Signed)
I have supervised the resident on the management of this patient and agree with the note above. I personally interviewed and examined the patient and my addendum is below.   [name] is a 52 y.o. male hx of stroke, HTN, COPD here with dizziness, fever, SOB. Dizziness was chronic, but was worse on the day of admission. He said that it was worse with standing up so I am not certain if it is presyncope. + subjective chills and coughing. Vitals showed that his O2 dec to 91% with coughing. + wheezing on exam. He was given prednisone, albuterol and admitted for COPD exacerbation. His neuro exam is nonfocal and CT head unremarkable. He is admitted to medicine for COPD and workup for dizziness.    Richardean Canal, MD 01/27/12 [phone]

## 2012-01-28 ENCOUNTER — Other Ambulatory Visit (HOSPITAL_COMMUNITY): Payer: Self-pay | Admitting: Interventional Radiology

## 2012-01-28 DIAGNOSIS — I6529 Occlusion and stenosis of unspecified carotid artery: Secondary | ICD-10-CM

## 2012-01-28 NOTE — Discharge Summary (Signed)
Patient Name:  [name] MRN: [id number]  PCP: Linward Headland, MD DOB:  [birth year]       Date of Admission:  01/26/2012  Date of Discharge:  01/28/2012      Attending Physician: Dr. Kem Kays         DISCHARGE DIAGNOSES: Influenza COPD exacerbation Vertigo PAD/R carotid stenosis Hyperlipidemia    DISPOSITION AND FOLLOW-UP: [name] is to follow-up with the listed providers as detailed below, at which time, the following should be addressed:   1. F/u on resolution of COPD/influenza symptoms 2. Labs / imaging needed: N/A 3. Pending labs/ test needing follow-up: N/A  Follow-up Information    Schedule an appointment as soon as possible for a visit with Janalyn Harder, MD.   Contact information:   [address] [place] [address] [phone]         Discharge Orders    Future Appointments: Provider: Department: Dept Phone: Center:   02/15/2012 8:00 AM [facility] [address] [facility] INTERVENTIONAL RADIOLOGY [phone] [facility]   05/03/2012 2:30 PM [facility] [phone] [facility]   05/03/2012 3:00 PM [facility] [phone] [facility]   05/03/2012 3:30 PM Larina Earthly, MD [facility] [phone] [facility]     Future Orders Please Complete By Expires   Diet - low sodium heart healthy      Increase activity slowly          DISCHARGE MEDICATIONS:   Medication List     As of 01/28/2012  4:31 PM    TAKE these medications         albuterol 108 (90 BASE) MCG/ACT inhaler   Commonly known as: PROVENTIL HFA;VENTOLIN HFA   Inhale 2 puffs into the lungs every 4 (four) hours as needed. For shortness of breath      aspirin EC 81 MG tablet   Take 81 mg by mouth daily.      atenolol 50 MG tablet   Commonly known as: TENORMIN   Take 1 tablet (50 mg total) by mouth daily.      atorvastatin 40 MG tablet   Commonly known as: LIPITOR   Take 40 mg by  mouth daily.      cilostazol 100 MG tablet   Commonly known as: PLETAL   Take 1 tablet (100 mg total) by mouth 2 (two) times daily.      clopidogrel 75 MG tablet   Commonly known as: PLAVIX   Take 1 tablet (75 mg total) by mouth daily.      diclofenac sodium 1 % Gel   Commonly known as: VOLTAREN   Apply 1 application topically 4 (four) times daily.      doxycycline 100 MG tablet   Commonly known as: VIBRA-TABS   Take 1 tablet (100 mg total) by mouth 2 (two) times daily.      fenofibrate 48 MG tablet   Commonly known as: TRICOR   Take 1 tablet (48 mg total) by mouth daily.      hydrocortisone cream 1 %   Apply 1 application topically 2 (two) times daily as needed. For itching      hydrOXYzine 25 MG tablet   Commonly known as: ATARAX/VISTARIL   Take 1 tablet (25 mg total) by mouth 2 (two) times daily as needed. For itching      lisinopril 5 MG tablet   Commonly known  as: PRINIVIL,ZESTRIL   Take 1 tablet (5 mg total) by mouth daily.      metFORMIN 500 MG tablet   Commonly known as: GLUCOPHAGE   Take 1 tablet (500 mg total) by mouth daily with breakfast.      oseltamivir 75 MG capsule   Commonly known as: TAMIFLU   Take 1 capsule (75 mg total) by mouth 2 (two) times daily.      pantoprazole 40 MG tablet   Commonly known as: PROTONIX   Take 1 tablet (40 mg total) by mouth daily as needed. For GERD      predniSONE 20 MG tablet   Commonly known as: DELTASONE   Take 2 tablets (40 mg total) by mouth daily.      ranitidine 150 MG tablet   Commonly known as: ZANTAC   Take 1 tablet (150 mg total) by mouth 2 (two) times daily as needed. For acid reflux         CONSULTS:       PROCEDURES PERFORMED:  Dg Chest 2 View  01/26/2012  *RADIOLOGY REPORT*  Clinical Data: Dizziness, fever, cough, smoker  CHEST - 2 VIEW  Comparison: 08/07/2011, 11/22/2011  Findings: Borderline cardiomegaly with central vascular and interstitial prominence.  Chronic central bronchitic changes as  before.  Prominent basilar vascular markings.  No definite focal pneumonia, collapse, consolidation, effusion or pneumothorax. Healed left upper rib fractures with chest wall deformity.  Lower cervical fusion hardware noted.  Trachea is midline.  IMPRESSION: Stable exam with slight vascular and interstitial prominence.  Chronic bronchitic changes.   Original Report Authenticated By: Judie Petit. Miles Costain, M.D.    Ct Head Wo Contrast  01/26/2012  *RADIOLOGY REPORT*  Clinical Data: Dizziness  CT HEAD WITHOUT CONTRAST  Technique:  Contiguous axial images were obtained from the base of the skull through the vertex without contrast.  Comparison: MRI head 10/21/2011  Findings: Mild atrophy.  Negative for hydrocephalus.  No acute infarct or mass.  Negative for intracranial hemorrhage.  No acute skull abnormality. Atherosclerotic calcification is present in the carotid and vertebral arteries.  IMPRESSION: Mild atrophy without acute intracranial abnormality.   Original Report Authenticated By: Janeece Riggers, M.D.        ADMISSION DATA: H&P: This is a 52 year old man with extensive peripheral vascular disease of the lower extremity vasculature and in the carotid arteries bilaterally, diabetes, hyperlipidemia, hypertension, COPD, and tobacco use. He comes to the ED today because of dizziness. Onset was 2 weeks ago. Dizziness is provoked by changes in position (laying to sitting and sitting to standing) as well as rolling over in the bed. Quality is described as "a spinning in my head". He also describes some lightheadedness and presyncope. These episodes of dizziness are random in occurrence; they can occur quite frequently throughout the day or 2-3 times a day. They last just a few seconds.  On an unrelated note, he complains of chills, increased dyspnea, and a nonproductive cough that began this morning. He also reports a runny nose but denies ear pain, sore throat, sinonasal congestion, nausea, and diarrhea.  On review of  systems, he denies headaches, visual disturbances, chest pain, palpitations, constipation, dysuria, and rashes. He denies new or worsening myalgias and arthralgias but suffers from chronic lower extremity cramping/claudication and lower back pain. He does not note any current sick contacts but his wife had a probable viral upper respiratory infection about one month ago. He has had the flu shot this year and previously had the Pneumovax shot  Physical Exam: GENERAL: well developed, well nourished; no acute distress  HEAD: atraumatic, normocephalic  EYES: pupils equal, round and reactive; sclera anicteric; normal conjunctiva  EARS: canals patent and TMs normal bilaterally  NOSE/THROAT: oropharynx clear, moist mucous membranes, pink gums, poor dentition  NECK: supple, no carotid bruits, thyroid normal in size and without palpable nodules  LYMPH: no cervical or supraclavicular lymphadenopathy  LUNGS: clear to auscultation bilaterally, normal work of breathing  HEART: normal rate and regular rhythm; normal S1 and S2 without S3 or S4; no murmurs, rubs, or clicks  PULSES: radial 2+ and symmetric, pedal pulses nonpalpable  ABDOMEN: soft, non-tender, normal bowel sounds, no masses organomegaly, tympany is normal  MSK: tenderness with palpation of the lumbar spine, no tenderness of the paraspinal musculature  MOTOR: 5 out of 5 strength bilaterally with handgrip, arm flexion, arm extension, shoulder abduction, plantarflexion, and dorsiflexion  SENSATION: Intact in the hands feet arms and legs  CRANIAL NERVES: 2 through 12 tested and intact: pupils reactive to light bilaterally; extra occular muscles are intact; facial sensation is intact and equal bilaterally in V1, V2, and V3, and masseter and temporalis function is intact; forehead wrinkles symmetrically, orbicularis oculi strength is normal and equal bilaterally, smile is symmetric, and depressor anguli oris function is intact bilaterally; hearing is  equal bilaterally, tested with a tuning fork; uvula is midline and palate elevates symmetrically; trapezius and sternocleidomastoid strength is normal and equal bilaterally; tongue protrudes midline.  NEURO: Epley test positive on the right with nystagmus and positive on the left with extreme dizziness and nystagmus  SKIN: warm, dry, intact, normal turgor, no rashes  EXTREMITIES: no peripheral edema, clubbing, or cyanosis  PSYCH: patient is alert and oriented, mood and affect are normal and congruent, thought content is normal without delusions, thought process is linear, speech is normal and non-pressured, behavior is normal, judgement and insight are normal   Labs: Lab results:  Basic Metabolic Panel:   Basename  01/26/12 1433   NA  135   K  3.8   CL  99   CO2  23   GLUCOSE  169*   BUN  12   CREATININE  0.88   CALCIUM  9.6   MG  --   PHOS  --    Liver Function Tests:   Basename  01/26/12 1433   AST  24   ALT  25   ALKPHOS  81   BILITOT  0.5   PROT  7.9   ALBUMIN  4.2    CBC:   Basename  01/26/12 1433   WBC  7.6   NEUTROABS  6.1   HGB  15.7   HCT  44.1   MCV  90.9   PLT  195    Urinalysis    Component  Value  Date/Time    COLORURINE  YELLOW  01/26/2012 2008    APPEARANCEUR  HAZY*  01/26/2012 2008    LABSPEC  1.029  01/26/2012 2008    PHURINE  7.0  01/26/2012 2008    GLUCOSEU  NEGATIVE  01/26/2012 2008    HGBUR  NEGATIVE  01/26/2012 2008    BILIRUBINUR  NEGATIVE  01/26/2012 2008    KETONESUR  NEGATIVE  01/26/2012 2008    PROTEINUR  NEGATIVE  01/26/2012 2008    UROBILINOGEN  0.2  01/26/2012 2008    NITRITE  NEGATIVE  01/26/2012 2008    LEUKOCYTESUR  NEGATIVE       HOSPITAL COURSE: Influenza - patient is  positive for influenza A (H1N1) despite being vaccinated for influenza in 10/2011. Symptoms are mild and he was afebrile throughout his course. He was discharged with a 5-day course of tamiflu and instructed to f/u with the [facility] after his flu symptoms are resolved. He  was instructed to go to the ED if his symptoms failed to improve or worsened.   COPD exacerbation - likely 2/2 influenza infection. No evidence of PNA on CXR. MRSA negative. He was treated as a severe exacerbation as he was hospitalized and has documented H1N1, despite not having an impressive presentation in terms of severity of cardinal symptoms. At discharge he was prescribed prednisone and doxycycline x 5 days.   Vertigo - unclear if patient has vertigo, as he states his symptoms have resolved on the day of discharge. If he truly has vertigo, it is most likely 2/2 vestibular neuronitis in setting of influenza infection. As he was asymptomatic on discharge, no new medications were prescribed (particularly as pt is a known heroin/opioid user and there is concern for over-sedation with typical medications prescribed for vertigo). Pt had carotid dopplers performed on the morning of discharge, which revealed no flow limiting stenosis in the R ICA and unchanged occlusion of the L ICA with antegrade vertebral flow bilaterally (unchanged since 03/2011).   PAD/R carotid stenosis - unchanged, per above, and highly unlikely to be contributory to presentation. Pt may need outpatient f/u on this issue with vascular surgery, although he appears asymptomatic during his hospital course. Pt's home plavix, ASA, and cilostazol were continued during the hospital course and at discharge.   Hyperlipidemia - on lipitor 40mg  at home. LDL = 122, with questionable compliance to home regimen. It will likely be appropriate to increase his home statin dose to 80mg  lipitor given his PAD/carotid stenosis if pt is deemed to have been compliant with his current lipitor dose. This issue should be further discussed at time of hospital f/u.   DISCHARGE DATA: Vital Signs: BP 110/60  Pulse 64  Temp 97.3 F (36.3 C) (Oral)  Resp 20  Ht 6\' 1"  (1.854 m)  Wt 168 lb 12.8 oz (76.567 kg)  BMI 22.27 kg/m2  SpO2 96%  Time spent on  discharge: 40 minutes  Services Ordered on Discharge: Y = Yes; Blank = No PT:   OT:   RN:   Equipment:   Other:     Signed: Elfredia Nevins, MD   PGY 1, Internal Medicine Resident 01/28/2012, 4:31 PM

## 2012-02-03 ENCOUNTER — Other Ambulatory Visit: Payer: Self-pay | Admitting: *Deleted

## 2012-02-03 MED ORDER — CILOSTAZOL 100 MG PO TABS: 100.0000 mg | ORAL_TABLET | Freq: Two times a day (BID) | ORAL | Status: DC

## 2012-02-03 NOTE — Discharge Summary (Signed)
INTERNAL MEDICINE TEACHING SERVICE Attending Note  Date: 02/03/2012  Patient name: [name]  Medical record number: [id number]  Date of birth: 15-Aug-1960    This patient has been seen and discussed with the house staff. Please see their note for complete details. I concur with their findings and plan.   Jonah Blue, DO  02/03/2012, 3:32 PM

## 2012-02-11 ENCOUNTER — Other Ambulatory Visit: Payer: Self-pay | Admitting: Radiology

## 2012-02-15 ENCOUNTER — Ambulatory Visit (HOSPITAL_COMMUNITY): Admission: RE | Admit: 2012-02-15 | Payer: Medicaid Other | Source: Ambulatory Visit

## 2012-02-15 ENCOUNTER — Other Ambulatory Visit: Payer: Self-pay | Admitting: Radiology

## 2012-02-16 ENCOUNTER — Other Ambulatory Visit (HOSPITAL_COMMUNITY): Payer: Self-pay | Admitting: Interventional Radiology

## 2012-02-16 ENCOUNTER — Ambulatory Visit (HOSPITAL_COMMUNITY)
Admission: RE | Admit: 2012-02-16 | Discharge: 2012-02-16 | Disposition: A | Discharge status: Home / Self Care | Payer: Medicaid Other | Source: Ambulatory Visit | Attending: Interventional Radiology | Admitting: Interventional Radiology

## 2012-02-16 DIAGNOSIS — Z8673 Personal history of transient ischemic attack (TIA), and cerebral infarction without residual deficits: Secondary | ICD-10-CM | POA: Insufficient documentation

## 2012-02-16 DIAGNOSIS — I6509 Occlusion and stenosis of unspecified vertebral artery: Secondary | ICD-10-CM | POA: Insufficient documentation

## 2012-02-16 DIAGNOSIS — I6529 Occlusion and stenosis of unspecified carotid artery: Secondary | ICD-10-CM

## 2012-02-16 DIAGNOSIS — R209 Unspecified disturbances of skin sensation: Secondary | ICD-10-CM | POA: Insufficient documentation

## 2012-02-16 DIAGNOSIS — R42 Dizziness and giddiness: Secondary | ICD-10-CM | POA: Insufficient documentation

## 2012-02-16 DIAGNOSIS — R29898 Other symptoms and signs involving the musculoskeletal system: Secondary | ICD-10-CM | POA: Insufficient documentation

## 2012-02-16 DIAGNOSIS — I658 Occlusion and stenosis of other precerebral arteries: Secondary | ICD-10-CM | POA: Insufficient documentation

## 2012-02-16 DIAGNOSIS — E119 Type 2 diabetes mellitus without complications: Secondary | ICD-10-CM | POA: Insufficient documentation

## 2012-02-16 DIAGNOSIS — Z9181 History of falling: Secondary | ICD-10-CM | POA: Insufficient documentation

## 2012-02-16 DIAGNOSIS — I10 Essential (primary) hypertension: Secondary | ICD-10-CM | POA: Insufficient documentation

## 2012-02-16 DIAGNOSIS — H538 Other visual disturbances: Secondary | ICD-10-CM | POA: Insufficient documentation

## 2012-02-16 DIAGNOSIS — J4489 Other specified chronic obstructive pulmonary disease: Secondary | ICD-10-CM | POA: Insufficient documentation

## 2012-02-16 DIAGNOSIS — J449 Chronic obstructive pulmonary disease, unspecified: Secondary | ICD-10-CM | POA: Insufficient documentation

## 2012-02-16 LAB — CBC WITH DIFFERENTIAL/PLATELET
Basophils Absolute: 0.1 10*3/uL (ref 0.0–0.1)
Basophils Relative: 1 % (ref 0–1)
Eosinophils Absolute: 0.4 10*3/uL (ref 0.0–0.7)
MCH: 32.8 pg (ref 26.0–34.0)
MCHC: 36.4 g/dL — ABNORMAL HIGH (ref 30.0–36.0)
Monocytes Relative: 8 % (ref 3–12)
Neutro Abs: 3.5 10*3/uL (ref 1.7–7.7)
Neutrophils Relative %: 49 % (ref 43–77)
Platelets: 207 10*3/uL (ref 150–400)
RDW: 13 % (ref 11.5–15.5)

## 2012-02-16 LAB — BASIC METABOLIC PANEL
BUN: 17 mg/dL (ref 6–23)
Calcium: 9.5 mg/dL (ref 8.4–10.5)
Creatinine, Ser: 0.87 mg/dL (ref 0.50–1.35)
GFR calc Af Amer: >90 mL/min (ref >90)
GFR calc non Af Amer: >90 mL/min (ref >90)
Glucose, Bld: 108 mg/dL — ABNORMAL HIGH (ref 70–99)

## 2012-02-16 LAB — PROTIME-INR
INR: 1.04 (ref 0.00–1.49)
Prothrombin Time: 13.5 seconds (ref 11.6–15.2)

## 2012-02-16 MED ORDER — HEPARIN SOD (PORK) LOCK FLUSH 100 UNIT/ML IV SOLN
INTRAVENOUS | Status: AC | PRN
  Administered 2012-02-16: 500 [IU] via INTRAVENOUS

## 2012-02-16 MED ORDER — FAMOTIDINE IN NACL 20-0.9 MG/50ML-% IV SOLN
INTRAVENOUS | Status: AC
  Filled 2012-02-16: qty 50

## 2012-02-16 MED ORDER — MIDAZOLAM HCL 2 MG/2ML IJ SOLN
INTRAMUSCULAR | Status: AC | PRN
  Administered 2012-02-16: 1 mg via INTRAVENOUS

## 2012-02-16 MED ORDER — IOHEXOL 300 MG/ML  SOLN
150.0000 mL | Freq: Once | INTRAMUSCULAR | Status: AC | PRN
  Administered 2012-02-16: 80 mL via INTRA_ARTERIAL

## 2012-02-16 MED ORDER — SODIUM CHLORIDE 0.9 % IV SOLN: Freq: Once | INTRAVENOUS | Status: DC

## 2012-02-16 MED ORDER — FAMOTIDINE IN NACL 20-0.9 MG/50ML-% IV SOLN
20.0000 mg | INTRAVENOUS | Status: AC
  Administered 2012-02-16: 20 mg via INTRAVENOUS

## 2012-02-16 MED ORDER — FENTANYL CITRATE 0.05 MG/ML IJ SOLN
INTRAMUSCULAR | Status: AC | PRN
  Administered 2012-02-16: 25 ug via INTRAVENOUS

## 2012-02-16 MED ORDER — HYDRALAZINE HCL 20 MG/ML IJ SOLN
INTRAMUSCULAR | Status: AC
  Filled 2012-02-16: qty 1

## 2012-02-16 MED ORDER — FENTANYL CITRATE 0.05 MG/ML IJ SOLN
INTRAMUSCULAR | Status: AC
  Filled 2012-02-16: qty 2

## 2012-02-16 MED ORDER — METHYLPREDNISOLONE SODIUM SUCC 125 MG IJ SOLR
INTRAMUSCULAR | Status: AC
  Filled 2012-02-16: qty 2

## 2012-02-16 MED ORDER — METHYLPREDNISOLONE SODIUM SUCC 125 MG IJ SOLR
125.0000 mg | Freq: Once | INTRAMUSCULAR | Status: AC
  Administered 2012-02-16: 125 mg via INTRAVENOUS

## 2012-02-16 MED ORDER — SODIUM CHLORIDE 0.9 % IV SOLN: INTRAVENOUS | Status: AC

## 2012-02-16 MED ORDER — MIDAZOLAM HCL 2 MG/2ML IJ SOLN
INTRAMUSCULAR | Status: AC
  Filled 2012-02-16: qty 4

## 2012-02-16 NOTE — ED Notes (Signed)
MD at bedside. 

## 2012-02-16 NOTE — ED Notes (Signed)
Sheath pulled by Lynann Beaver, RT.  Doppler pulses prior to procedure.  Neuro intact.

## 2012-02-16 NOTE — Procedures (Signed)
S/P bilateral carotid arteriograms ,and bilateral vertebral arteriograms. 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 3. Approx 50 % stenosis of RT VA origin
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
## 2012-02-16 NOTE — ED Notes (Signed)
o2 d/c'd 

## 2012-02-17 ENCOUNTER — Telehealth (HOSPITAL_COMMUNITY): Payer: Self-pay | Admitting: *Deleted

## 2012-02-17 NOTE — Telephone Encounter (Signed)
Post procedure follow up call.  Pt says doing well without questions or concerns at this time.  Says care received was good
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
## 2012-02-22 IMAGING — CR DG ANKLE COMPLETE 3+V*L*
3 series · 3 of 3 positions shown · non-contrast
Comparison: 09/10/2009

CLINICAL DATA: Osteomyelitis left leg.

LEFT ANKLE COMPLETE - 3+ VIEW

[t ankle joint ap left]
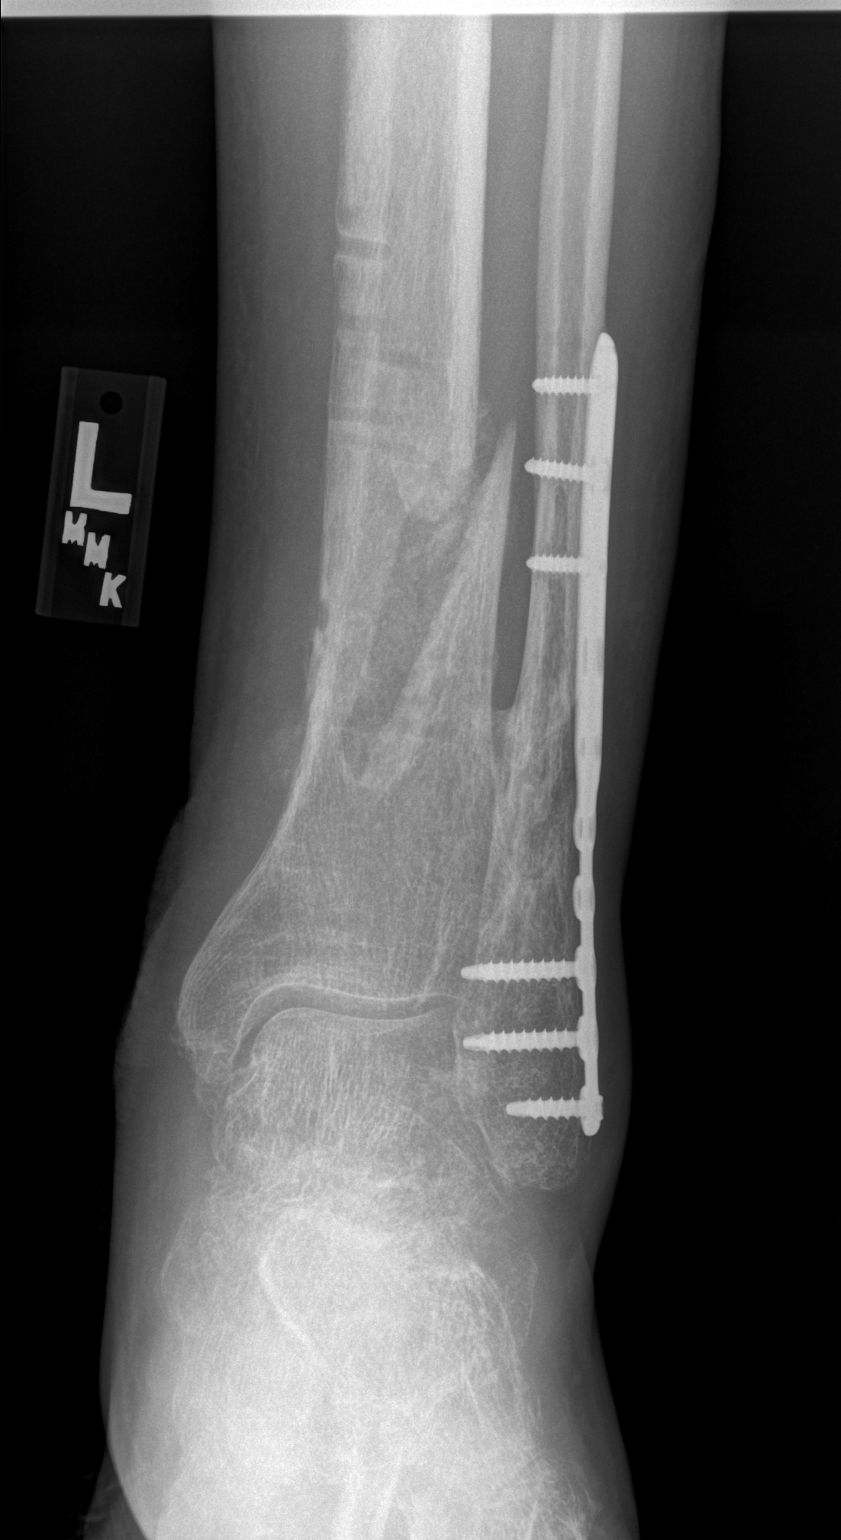

[t ankle joint oblique left]
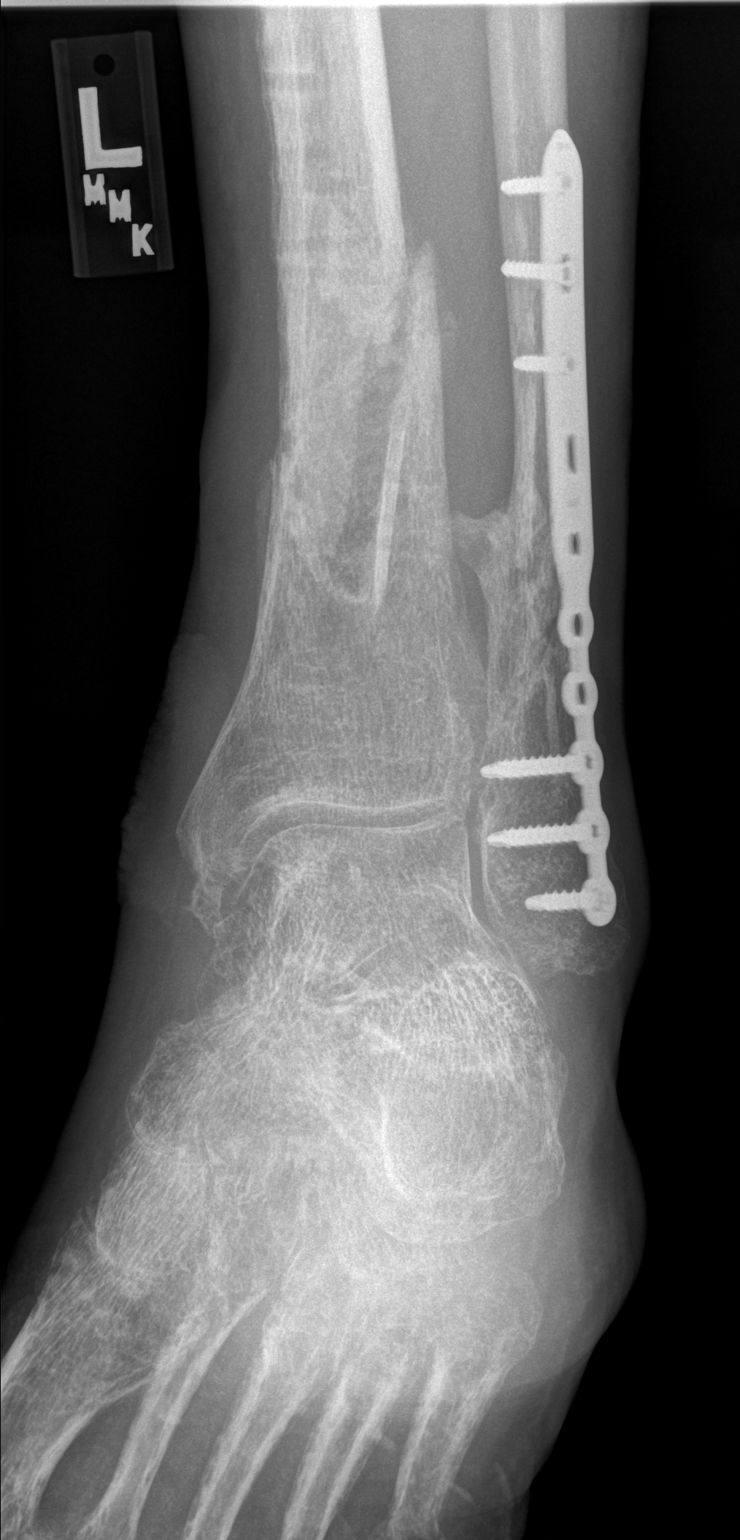

[t ankle joint lat left]
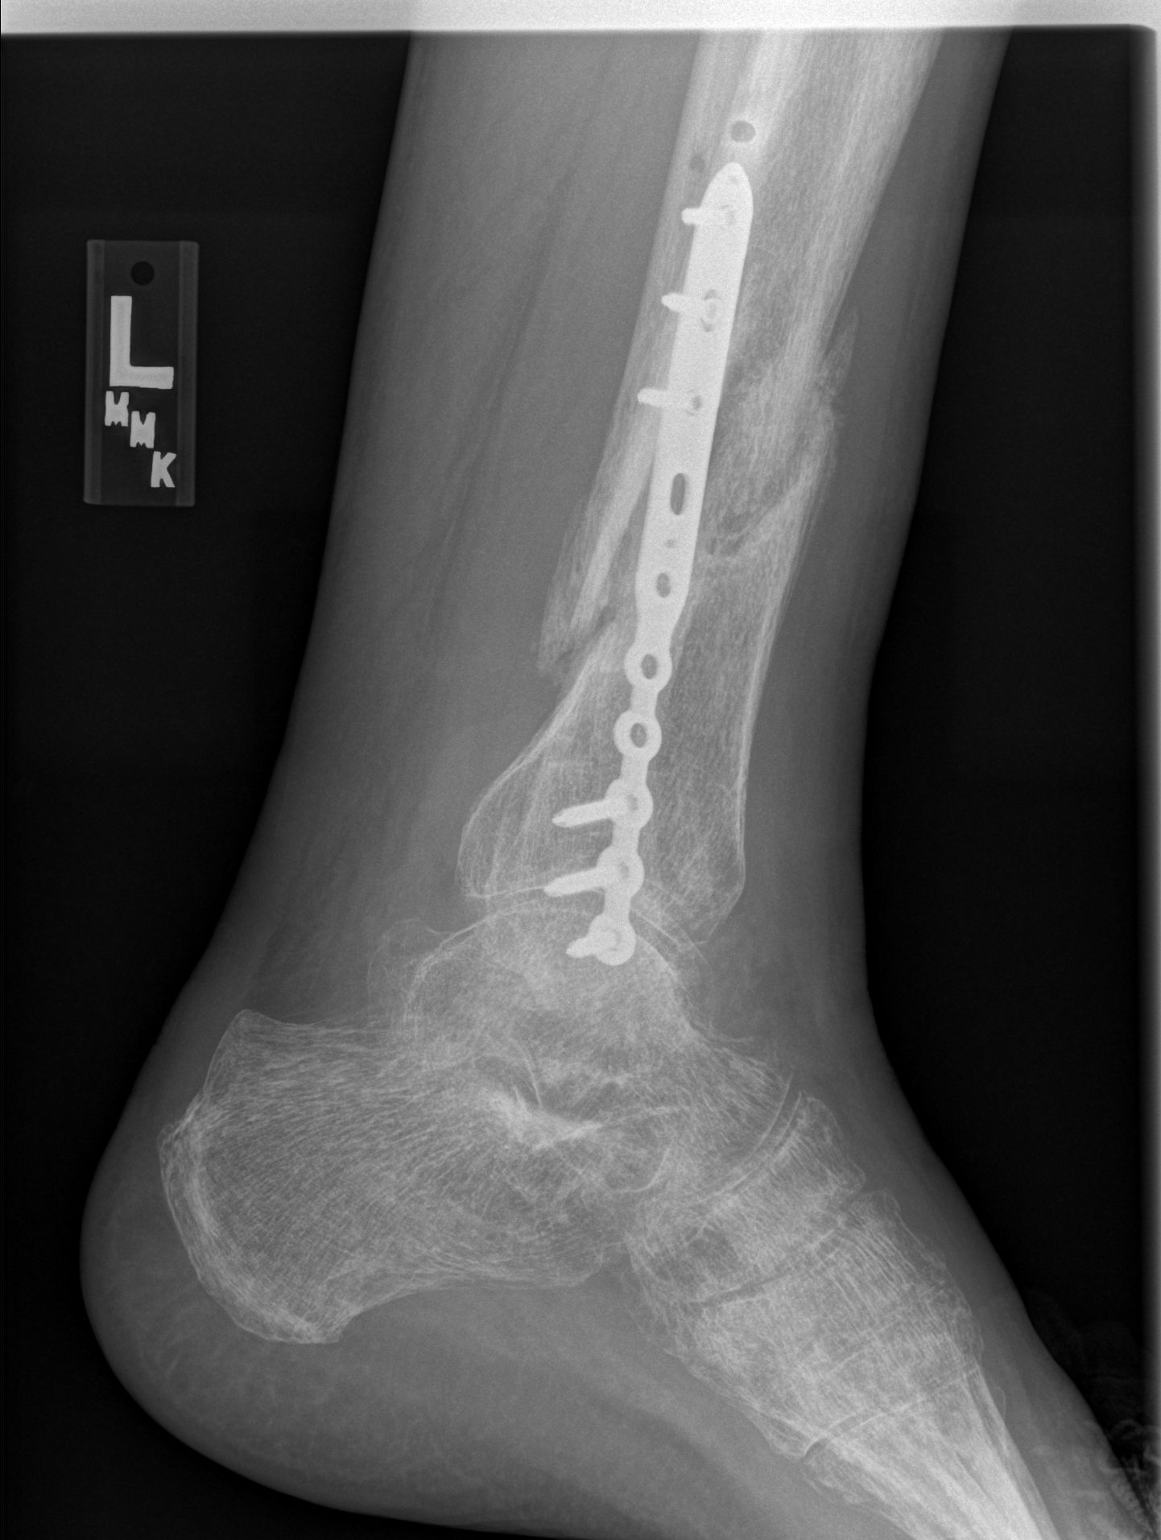

[3 of 3 positions shown; findings below may reference images not displayed]

FINDINGS: Hardware within the distal fibula again noted, unchanged
with evidence of healing across the distal fibular fracture.
Distal tibial fracture again noted.  Minimal interval callus
formation.  Fracture line remains evident compatible with nonunion.
IMPRESSION: Some callus formation across the left tibial fracture, but fracture
line remains easily visible compatible with nonunion.

## 2012-03-03 ENCOUNTER — Telehealth: Payer: Self-pay | Admitting: *Deleted

## 2012-03-03 ENCOUNTER — Encounter: Payer: Self-pay | Admitting: Internal Medicine

## 2012-03-03 ENCOUNTER — Ambulatory Visit (INDEPENDENT_AMBULATORY_CARE_PROVIDER_SITE_OTHER): Payer: Medicaid Other | Admitting: Internal Medicine

## 2012-03-03 VITALS — BP 127/71 | HR 71 | Temp 96.9°F | Resp 20 | Ht 70.5 in | Wt 163.8 lb

## 2012-03-03 DIAGNOSIS — A630 Anogenital (venereal) warts: Secondary | ICD-10-CM

## 2012-03-03 LAB — LIPID PANEL
HDL: 37 mg/dL — ABNORMAL LOW (ref >39)
Total CHOL/HDL Ratio: 4.5 Ratio
VLDL: 24 mg/dL (ref 0–40)

## 2012-03-03 MED ORDER — IMIQUIMOD 5 % EX CREA: TOPICAL_CREAM | CUTANEOUS | Status: DC

## 2012-03-03 MED ORDER — HYDROXYZINE HCL 25 MG PO TABS: 25.0000 mg | ORAL_TABLET | Freq: Two times a day (BID) | ORAL | Status: DC | PRN
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
## 2012-03-03 NOTE — Telephone Encounter (Signed)
Patient called the office triage phone at 11:23 this morning, wanting to be seen today. I tried to call him back X 3, each time it rang and then the voice mail said that no messages could be received. Patient walked in to the office at 1410 this afternoon wanting to see the doctor, All nurses were busy with patients and he was asked to have a seat and somebody would be with him asap. He stayed until 1430 and left without being seen, telling the front desk lady that he was going to the ED.

## 2012-03-03 NOTE — Patient Instructions (Signed)
1. Aldara- cream for warts: Leave on skin 6-10 hours before bedtime. Removed with washing with mild soap. Continue therapy until total clearance or maximum of 16 weeks. You can use petroleum gel on the skin surrounding the area where you are putting the special cream

## 2012-03-03 NOTE — Assessment & Plan Note (Addendum)
Genital lesion most likely warts. I will start patient on Aldara was for first-line treatment for genital warts. Provided prescription for hydroxyzine for itching. Recommended to leave it on for 6-10 hours and wash it in the morning.He should continue the treatment until complete resolution. I informed the patient that it is contagious so he needs to wash his hands on a regular basis. I will obtain HIV.

## 2012-03-04 ENCOUNTER — Encounter (INDEPENDENT_AMBULATORY_CARE_PROVIDER_SITE_OTHER): Payer: Medicaid Other | Admitting: *Deleted

## 2012-03-04 ENCOUNTER — Encounter: Payer: Self-pay | Admitting: Neurosurgery

## 2012-03-04 ENCOUNTER — Ambulatory Visit (INDEPENDENT_AMBULATORY_CARE_PROVIDER_SITE_OTHER): Payer: Medicaid Other | Admitting: Neurosurgery

## 2012-03-04 ENCOUNTER — Telehealth: Payer: Self-pay

## 2012-03-04 ENCOUNTER — Emergency Department (HOSPITAL_COMMUNITY): Payer: Medicaid Other

## 2012-03-04 ENCOUNTER — Encounter (HOSPITAL_COMMUNITY): Payer: Self-pay | Admitting: Emergency Medicine

## 2012-03-04 VITALS — BP 117/69 | HR 72 | Temp 97.0°F | Resp 18 | Ht 73.0 in | Wt 163.0 lb

## 2012-03-04 DIAGNOSIS — Z8679 Personal history of other diseases of the circulatory system: Secondary | ICD-10-CM | POA: Insufficient documentation

## 2012-03-04 DIAGNOSIS — K219 Gastro-esophageal reflux disease without esophagitis: Secondary | ICD-10-CM | POA: Insufficient documentation

## 2012-03-04 DIAGNOSIS — J4489 Other specified chronic obstructive pulmonary disease: Secondary | ICD-10-CM | POA: Insufficient documentation

## 2012-03-04 DIAGNOSIS — R296 Repeated falls: Secondary | ICD-10-CM | POA: Insufficient documentation

## 2012-03-04 DIAGNOSIS — IMO0002 Reserved for concepts with insufficient information to code with codable children: Secondary | ICD-10-CM | POA: Insufficient documentation

## 2012-03-04 DIAGNOSIS — Z8611 Personal history of tuberculosis: Secondary | ICD-10-CM | POA: Insufficient documentation

## 2012-03-04 DIAGNOSIS — E119 Type 2 diabetes mellitus without complications: Secondary | ICD-10-CM | POA: Insufficient documentation

## 2012-03-04 DIAGNOSIS — Y929 Unspecified place or not applicable: Secondary | ICD-10-CM | POA: Insufficient documentation

## 2012-03-04 DIAGNOSIS — Y939 Activity, unspecified: Secondary | ICD-10-CM | POA: Insufficient documentation

## 2012-03-04 DIAGNOSIS — Z87448 Personal history of other diseases of urinary system: Secondary | ICD-10-CM | POA: Insufficient documentation

## 2012-03-04 DIAGNOSIS — S8990XA Unspecified injury of unspecified lower leg, initial encounter: Secondary | ICD-10-CM | POA: Insufficient documentation

## 2012-03-04 DIAGNOSIS — F17200 Nicotine dependence, unspecified, uncomplicated: Secondary | ICD-10-CM | POA: Insufficient documentation

## 2012-03-04 DIAGNOSIS — Z7982 Long term (current) use of aspirin: Secondary | ICD-10-CM | POA: Insufficient documentation

## 2012-03-04 DIAGNOSIS — Z8673 Personal history of transient ischemic attack (TIA), and cerebral infarction without residual deficits: Secondary | ICD-10-CM | POA: Insufficient documentation

## 2012-03-04 DIAGNOSIS — G894 Chronic pain syndrome: Secondary | ICD-10-CM | POA: Insufficient documentation

## 2012-03-04 DIAGNOSIS — Z8614 Personal history of Methicillin resistant Staphylococcus aureus infection: Secondary | ICD-10-CM | POA: Insufficient documentation

## 2012-03-04 DIAGNOSIS — I10 Essential (primary) hypertension: Secondary | ICD-10-CM | POA: Insufficient documentation

## 2012-03-04 DIAGNOSIS — Z8619 Personal history of other infectious and parasitic diseases: Secondary | ICD-10-CM | POA: Insufficient documentation

## 2012-03-04 DIAGNOSIS — G4730 Sleep apnea, unspecified: Secondary | ICD-10-CM | POA: Insufficient documentation

## 2012-03-04 DIAGNOSIS — Z7902 Long term (current) use of antithrombotics/antiplatelets: Secondary | ICD-10-CM | POA: Insufficient documentation

## 2012-03-04 DIAGNOSIS — E785 Hyperlipidemia, unspecified: Secondary | ICD-10-CM | POA: Insufficient documentation

## 2012-03-04 DIAGNOSIS — Z79899 Other long term (current) drug therapy: Secondary | ICD-10-CM | POA: Insufficient documentation

## 2012-03-04 LAB — HIV ANTIBODY (ROUTINE TESTING W REFLEX): HIV: NONREACTIVE

## 2012-03-04 MED ORDER — OXYCODONE-ACETAMINOPHEN 5-325 MG PO TABS
1.0000 | ORAL_TABLET | Freq: Once | ORAL | Status: AC
  Administered 2012-03-04: 1 via ORAL
  Filled 2012-03-04: qty 1

## 2012-03-04 NOTE — Telephone Encounter (Signed)
Pt. walked in to clinic for c/o burning pain from right buttocks down to foot with activity and rest x 3 days.  States "color of my foot comes and goes."   Denies any open sores.  State has good mobility when questioned.  Discussed w/ Dr. Imogene Burn.  Advised to check ABI's and see the NP.  Pt. Advised to return at 1:50 pm this afternoon.  Agrees.

## 2012-03-04 NOTE — ED Notes (Signed)
PT. REPORTS RIGHT LOW BACK PAIN RADIATING TO RIGHT BUTTOCK AND RIGHT LEG ONSET LAST WEEK , PT. STATED HE FELL LAST WEEK , PAIN WORSE WITH WEIGHT BEARING AND CERTAIN POSITIONS.
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
## 2012-03-05 ENCOUNTER — Emergency Department (HOSPITAL_COMMUNITY)
Admission: EM | Admit: 2012-03-05 | Discharge: 2012-03-05 | Disposition: A | Discharge status: Home / Self Care | Payer: Medicaid Other | Attending: Emergency Medicine | Admitting: Emergency Medicine

## 2012-03-05 MED ORDER — HYDROMORPHONE HCL PF 2 MG/ML IJ SOLN
2.0000 mg | Freq: Once | INTRAMUSCULAR | Status: DC
  Filled 2012-03-05: qty 1

## 2012-03-05 MED ORDER — OXYCODONE-ACETAMINOPHEN 5-325 MG PO TABS
1.0000 | ORAL_TABLET | Freq: Once | ORAL | Status: AC
  Administered 2012-03-05: 1 via ORAL
  Filled 2012-03-05: qty 1

## 2012-03-05 MED ORDER — HYDROMORPHONE HCL PF 1 MG/ML IJ SOLN
1.0000 mg | Freq: Once | INTRAMUSCULAR | Status: AC
  Administered 2012-03-05: 1 mg via INTRAVENOUS

## 2012-03-05 MED ORDER — OXYCODONE-ACETAMINOPHEN 5-325 MG PO TABS: 1.0000 | ORAL_TABLET | ORAL | Status: DC | PRN
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
## 2012-03-05 NOTE — ED Notes (Signed)
Pt now ambulatory to restroom

## 2012-03-07 ENCOUNTER — Telehealth: Payer: Self-pay | Admitting: *Deleted

## 2012-03-07 ENCOUNTER — Encounter: Payer: Self-pay | Admitting: Internal Medicine

## 2012-03-07 ENCOUNTER — Ambulatory Visit (INDEPENDENT_AMBULATORY_CARE_PROVIDER_SITE_OTHER): Payer: Medicaid Other | Admitting: Internal Medicine

## 2012-03-07 VITALS — BP 101/59 | HR 84 | Temp 97.4°F | Ht 73.0 in | Wt 163.5 lb

## 2012-03-07 MED ORDER — OXYCODONE-ACETAMINOPHEN 5-325 MG PO TABS: 1.0000 | ORAL_TABLET | ORAL | Status: DC | PRN

## 2012-03-07 NOTE — Progress Notes (Signed)
Patient ID: [name], male   DOB: 27-Jan-1960, 52 y.o.   MRN: [id number]  History of present illness: [name] is a 52 yo man with PMH of PVD followed by Dr. early, hypertension, diabetes, anxiety, chronic left hip pain presents today for right buttock pain. He reports Pain in Right Buttock down to his right leg since 02/27/12 when he fell backward on the snow but denies any loss of consciousness.  He reports some +right leg weakness, 10/10 in severity.  He denies any urinary or bowel incontinence or any focal neurological deficits.  He was seen by vascular surgery on 02/02/12 and then went to the ED on 02/03/12.  Xray of lumbar did not show any fracture. He was given 15 tablets of Percocet 5/325 and states that he has used up most of them except for one pill left. He is able to bear weight on right side but it does hurt.  Denies any fever or chills.  He has been taking Percocet 5mg  but not strong enough.  He tried heating pad but did not work.  Of note, patient has pain seeking behavior and violated pain contracts from internal medicine clinic when he was positive for cocaine. He was also referred to several pain clinic who also refused to see him. He was also sent to orthopedics at [facility] who denied to see him as well.  Patient presents today demanding (angry) for narcotics or get an MRI or candidate to the hospital for pain control. He states that he just took some pain medication therefore is not feeling pain at this time for my examination. He tells me that "I don't even know why they made me come to clinic today, because I rather go to the emergency room at [facility]".  ROS: as per HPI  PE: General: alert, well-developed, and cooperative to examination.  Lungs: normal respiratory effort, no accessory muscle use, normal breath sounds, no crackles, and no wheezes. Heart: normal rate, regular rhythm, no murmur, no gallop, and no rub.  Abdomen: soft, non-tender, normal bowel sounds, no  distention, no guarding, no rebound tenderness.  Neurologic: nonfocal. Motor strength 5 out of 5 in upper and lower extremity. Sensation intact. Straight leg test racing was negative bilaterally Ext: No tenderness to palpation of the right buttock as well as along the posterior aspect of his right leg. No trauma or ecchymosis or erythema noted. He is able to bear weight, walking with a cane. Skin: turgor normal  Psych: anxious, angry appearing

## 2012-03-07 NOTE — Assessment & Plan Note (Signed)
Unclear etiology. Examination was unremarkable today including a negative straight leg test racing, no tenderness to palpation of his right buttocks as well as his right leg. He has no focal neurological deficit including no urinary incontinence bowel incontinence. He reports some weakness on the right leg however motor strength was 5 out of 5 in lower and upper extremity. Of note, patient has a history of pain seeking behavior and has been dismissed from our internal medicine clinic as well as other pain clinic and orthopedics office in [place] as well. He states that Percocet 5 mg is not strong enough. He tells me that he wants narcotics and/or MRI and referral to [facility] orthopedics or else he would go to wait for his emergency room. After reviewing his lumbar x-ray that was done on 03/04/12, he had no acute fracture. In addition he had no red flag signs or symptoms at this time to warrant an MRI. We will refer him to [facility] orthopedics per patient request. I also gave him a one-time Percocet 5/325 mg every 4 hours when necessary #15, no refill. It was explained to patient that since he violated pain contract we will not give him narcotics except for this one time only while he is waiting to see orthopedist and that this is only temporary.  Case discussed with attending, Dr. Eben Burow.

## 2012-03-07 NOTE — Telephone Encounter (Signed)
Pt calls and states he fell last week and has pain since then, states he went to dr early's office and was told it was probably a "ruptured disc", states he needs an appt and will need to be admitted for pain control. appt [id number] dr Anselm Jungling, informed to use valet pking and use a wh/ch if unable to walk to clinic. States he is dying of pain, nothing helps and everything makes it worse.

## 2012-03-07 NOTE — Patient Instructions (Addendum)
Will refer to [facility] orthopedics You can take Percocet 5/325mg  every 4 hours as needed for pain

## 2012-03-10 ENCOUNTER — Other Ambulatory Visit: Payer: Self-pay | Admitting: *Deleted

## 2012-03-10 NOTE — Telephone Encounter (Signed)
Call from pt - states he saw [facility],will have MRI in 2 weeks and was given Tramadol.  States Tramadol does not help his pain. Requesting refill on Percocet.  Thanks

## 2012-03-10 NOTE — Telephone Encounter (Signed)
Talked to Dr. Anselm Jungling - Percocet refill denied. Called pt and informed him of denial; stated he's going to the ED b/c he can not tolerate the pain.

## 2012-03-12 DIAGNOSIS — W009XXA Unspecified fall due to ice and snow, initial encounter: Secondary | ICD-10-CM

## 2012-03-12 HISTORY — DX: Unspecified fall due to ice and snow, initial encounter: W00.9XXA

## 2012-03-22 ENCOUNTER — Other Ambulatory Visit: Payer: Self-pay | Admitting: Radiation Oncology

## 2012-03-23 ENCOUNTER — Encounter: Payer: Medicaid Other | Admitting: Internal Medicine

## 2012-04-06 ENCOUNTER — Telehealth: Payer: Self-pay | Admitting: *Deleted

## 2012-04-06 DIAGNOSIS — F528 Other sexual dysfunction not due to a substance or known physiological condition: Secondary | ICD-10-CM

## 2012-04-06 IMAGING — MR MR [PERSON_NAME] LOW WO/W CM*L*
6 of 9 series · 28 of 40 positions shown · IV contrast (multihance)
Comparison: 10/29/2009

CLINICAL DATA: Nonhealing ulcer.  Possible osteomyelitis or soft
tissue abscess.

MRI OF THE LEFT LOWER LEG WITH AND WITHOUT CONTRAST
TECHNIQUE: Multiplanar, multisequence MR imaging of the left lower
leg was performed before and after the administration of
intravenous contrast.
Contrast: 17 ml Multihance

[Series 2: T1 · sagittal · 5.0mm · 0.86mm/px · 3 of 18 slices shown (1 of 3)]
[im 1/18]
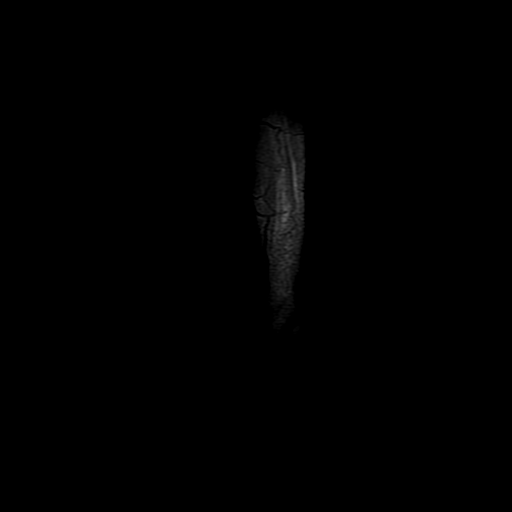
[im 9/18]
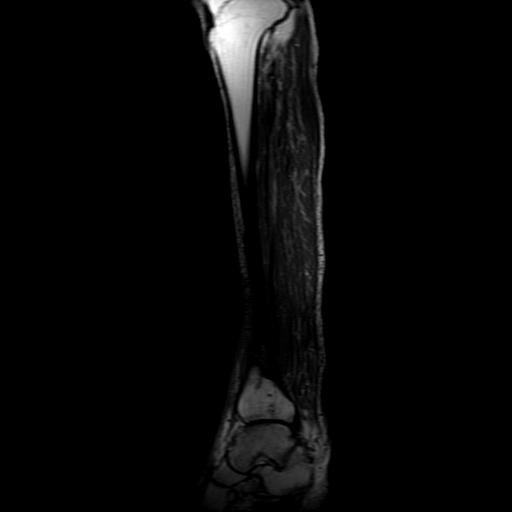
[im 18/18]
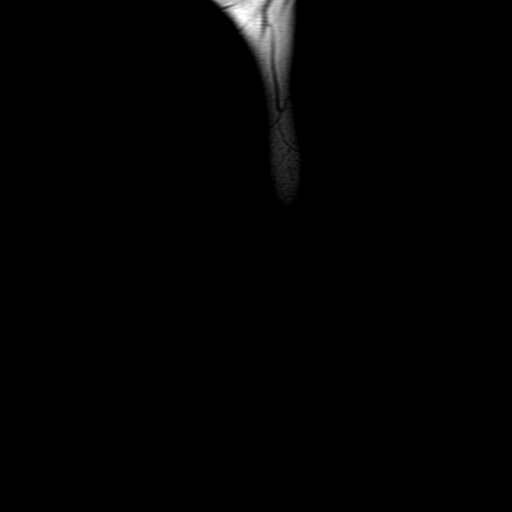

[Series 5: T1 · coronal · 5.0mm · 0.78mm/px · 2 of 18 slices shown (2 of 3)]
[im 1/18]
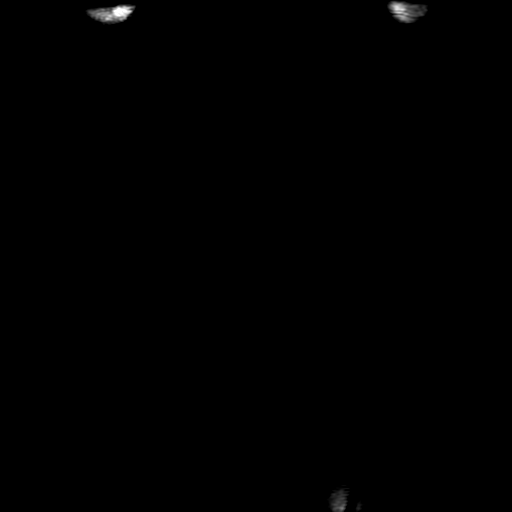
[im 18/18]
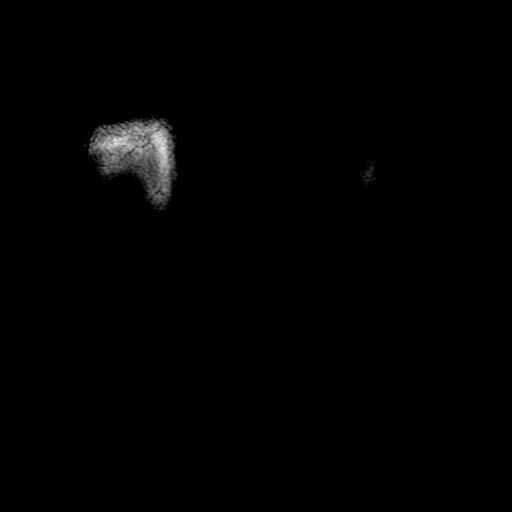

[Series 8: T1 · axial · 6.0mm · 0.70mm/px · z∈[-247,+165]mm · 7 of 60 slices shown (3 of 3)]
[im 1/60]
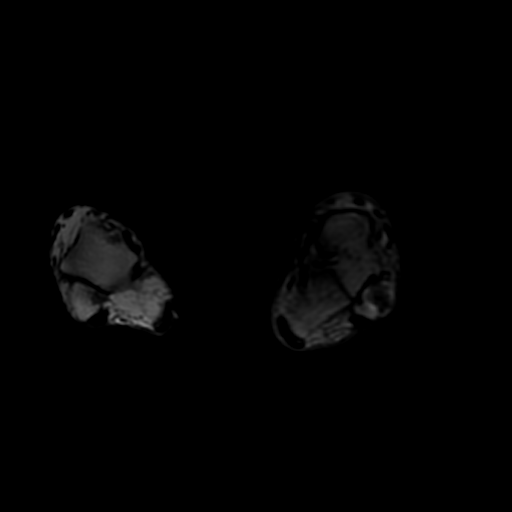
[im 10/60]
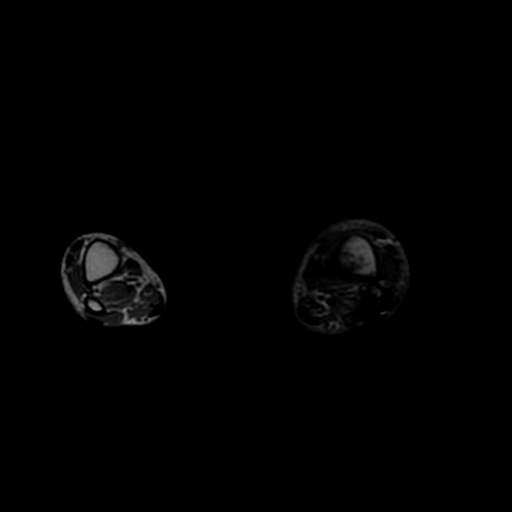
[im 20/60]
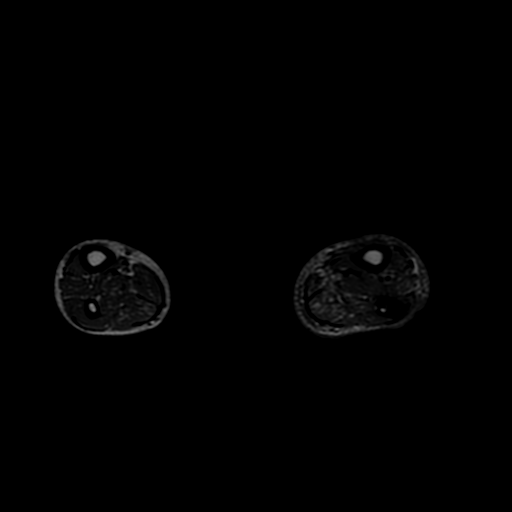
[im 30/60]
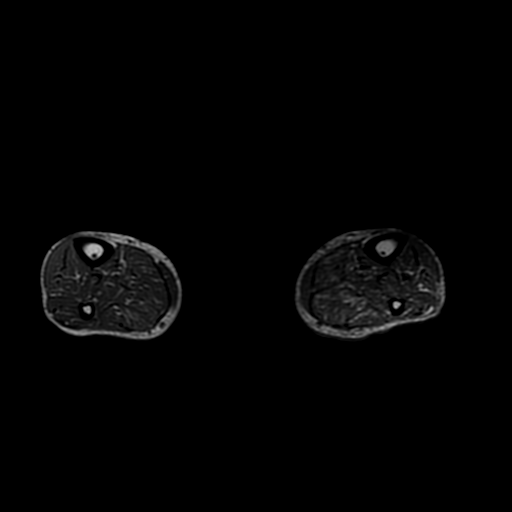
[im 40/60]
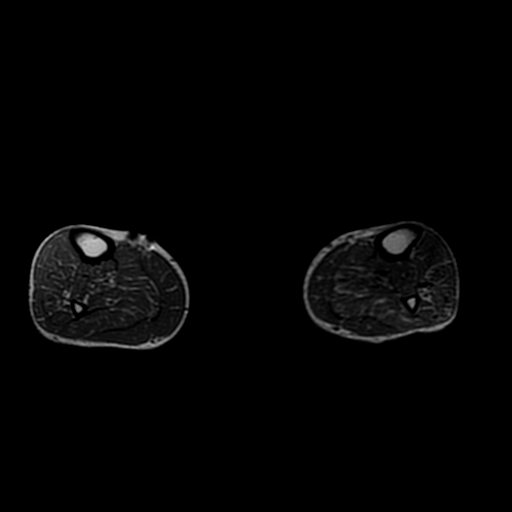
[im 50/60]
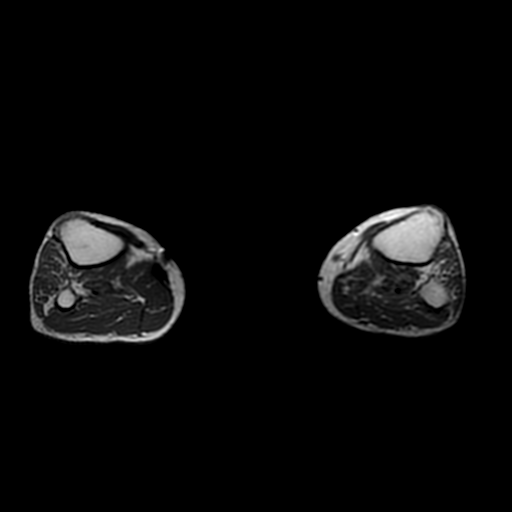
[im 60/60]
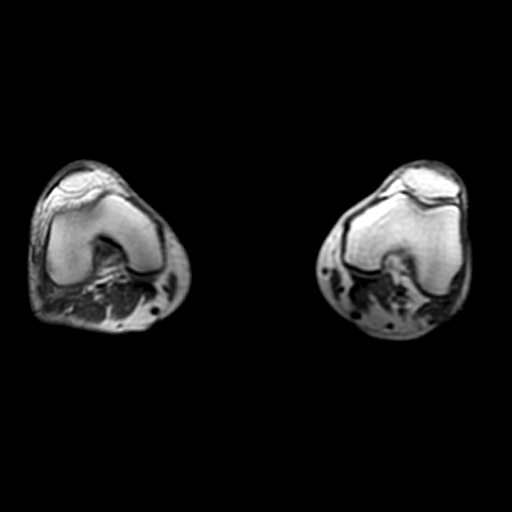

[Series 9: T1 fat-sat · axial · non-contrast · 6.0mm · 1.41mm/px · z∈[-247,+165]mm · 7 of 60 slices shown]
[im 1/60]
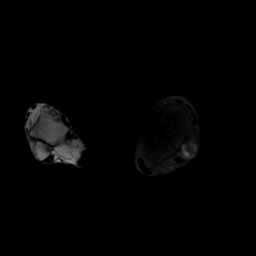
[im 10/60]
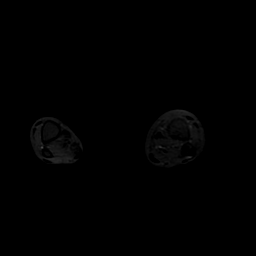
[im 20/60]
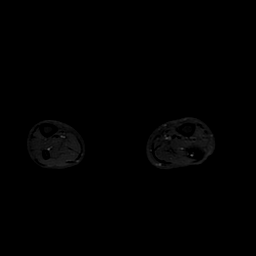
[im 30/60]
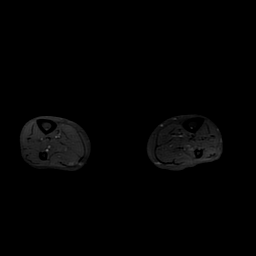
[im 40/60]
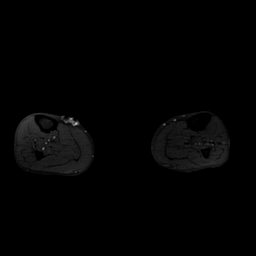
[im 50/60]
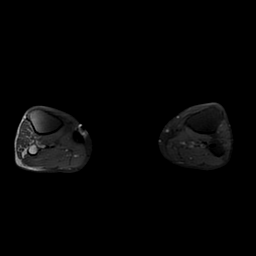
[im 60/60]
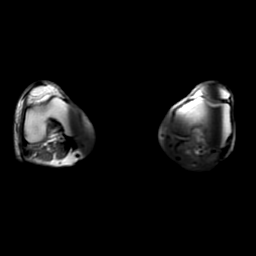

[Series 10: T1 fat-sat post-contrast · axial · 6.0mm · 1.41mm/px · z∈[-247,+165]mm · 7 of 60 slices shown]
[im 1/60]
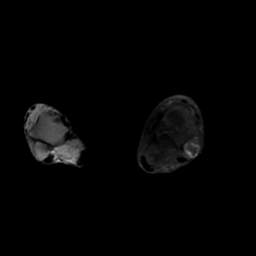
[im 10/60]
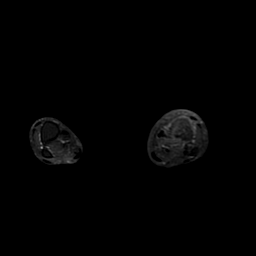
[im 20/60]
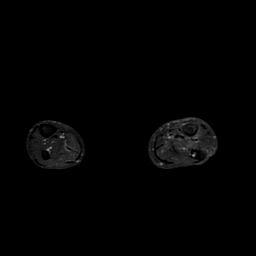
[im 30/60]
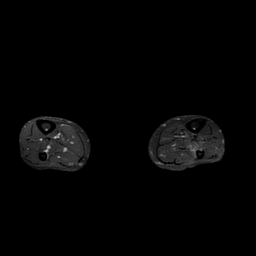
[im 40/60]
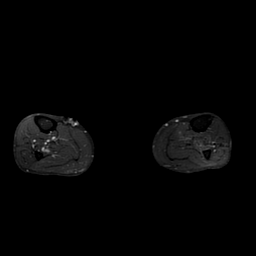
[im 50/60]
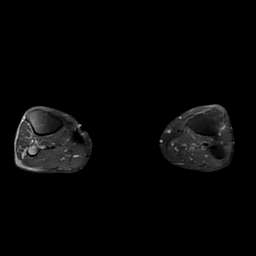
[im 60/60]
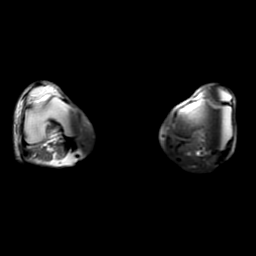

[Series 11: T1 post-contrast · coronal · 5.0mm · 0.78mm/px · 2 of 18 slices shown]
[im 1/18]
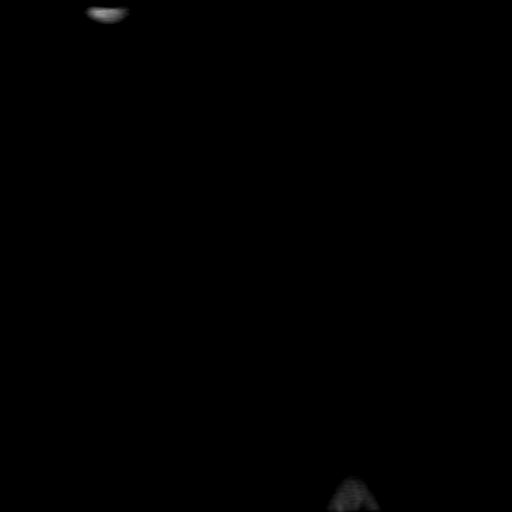
[im 18/18]
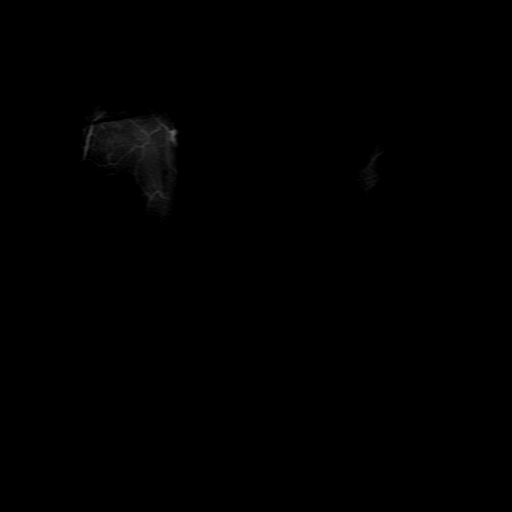

[28 of 40 positions shown; findings below may reference images not displayed]

FINDINGS: Nonunited left distal tibial shaft fracture noted with
edema and enhancement both in the fracture plane and in the
adjacent surrounding soft tissues, particularly along the tibialis
posterior, flexor digitorum, flexor hallucis longus, and soleus
muscles.

There is also some mild edema and enhancement along the posterior
margin of the anterior muscle group, and along the interosseous
membrane.

I do not discern a sequestrum or definite Brodie abscess in the
vicinity of the fracture, but there is edema and enhancement in the
adjacent medullary bone along with cortical thickening and ill
definition along the chronic fracture site which could be an
indicator of chronic osteomyelitis.

Continuity of enhancing soft tissues with the fracture plane is
shown on images 46 and 47 of series 7, and while I do not
demonstrate a walled off abscess, collapsed sinus tracts can be
occult on MRI.

Fibular plate screw fixator noted on the left.  There are findings
of periostitis in the vicinity of the fracture.  Last month's
radiographs showed significant regional bony demineralization,
probably from osteoporosis of disuse.

A skin marker superficial to the medial malleolus is adjacent to a
region of confluent subcutaneous edema and enhancement, probably
representing cellulitis.
IMPRESSION: 1.  Persistent mildly angulated distal tibial diaphyseal fracture,
with evidence of early nonunion but also with evidence of
considerable enhancement both within the fracture plane, within the
adjacent medullary space of the tibia, and in the surrounding
fascial and muscular tissues.  This tracks proximally in the
posterior compartment as best appreciated on the inversion recovery
weighted images.  Given the degree of periostitis, edema, and
enhancement, I am suspicious for chronic osteomyelitis along the
fracture site with myositis and possibly fasciitis in the posterior
compartment and along the interosseous membrane.  However, no
drainable abscess is identified, and no bony sequestrum is seen.

## 2012-04-06 NOTE — Telephone Encounter (Signed)
Pt called to see if you would send in Rx to [facility] for an injection for his ED or will he have to return to [facility] for this. Pt # [id number]  I called pharmacy and pt has received a compound containing  prostaglandin 10 mcg, papaverine 30 mg and phentolamine 1 mg in 5 ml bottle. Pt injects 1/2 cc into penis as needed.  This was ordered by   Dr. Laurence Compton @ [facility].  I assume this will need to be prescribed by Dr Jewel Baize.

## 2012-04-11 NOTE — Telephone Encounter (Signed)
This medication will need to be prescribed by the patient's Urologist, not from our clinic.

## 2012-04-14 NOTE — Telephone Encounter (Signed)
Pt informed. He now states he will need another referral to Urology at [facility], so he will be able to get this medication. They will not refill until he is seen by them.

## 2012-04-14 NOTE — Telephone Encounter (Signed)
I've placed a referral for this, and forwarded a message to Lela.

## 2012-04-16 ENCOUNTER — Encounter: Payer: Self-pay | Admitting: Internal Medicine

## 2012-04-16 DIAGNOSIS — R269 Unspecified abnormalities of gait and mobility: Secondary | ICD-10-CM | POA: Insufficient documentation

## 2012-04-19 ENCOUNTER — Other Ambulatory Visit: Payer: Self-pay | Admitting: Internal Medicine

## 2012-04-20 ENCOUNTER — Encounter: Payer: Medicaid Other | Admitting: Internal Medicine

## 2012-05-02 ENCOUNTER — Telehealth: Payer: Self-pay | Admitting: *Deleted

## 2012-05-02 ENCOUNTER — Encounter: Payer: Self-pay | Admitting: Vascular Surgery

## 2012-05-03 ENCOUNTER — Encounter: Payer: Self-pay | Admitting: Vascular Surgery

## 2012-05-03 ENCOUNTER — Encounter (INDEPENDENT_AMBULATORY_CARE_PROVIDER_SITE_OTHER): Payer: Medicaid Other | Admitting: Vascular Surgery

## 2012-05-03 ENCOUNTER — Ambulatory Visit (INDEPENDENT_AMBULATORY_CARE_PROVIDER_SITE_OTHER): Payer: Medicaid Other | Admitting: Vascular Surgery

## 2012-05-03 ENCOUNTER — Telehealth: Payer: Self-pay | Admitting: *Deleted

## 2012-05-03 VITALS — BP 128/64 | HR 74 | Resp 16 | Ht 73.0 in | Wt 161.0 lb

## 2012-05-03 DIAGNOSIS — I739 Peripheral vascular disease, unspecified: Secondary | ICD-10-CM

## 2012-05-03 DIAGNOSIS — M79609 Pain in unspecified limb: Secondary | ICD-10-CM

## 2012-05-03 DIAGNOSIS — Z48812 Encounter for surgical aftercare following surgery on the circulatory system: Secondary | ICD-10-CM

## 2012-05-03 NOTE — Progress Notes (Signed)
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 He reports that he is to have a great deal of discomfort mostly related to a fractured distal tib-fib from 2011. He also describes a restless sensation in both lower extremities. He is requesting narcotic pain medication I told him that I am not able to do this since he does not have any evidence of occlusive disease causing pain. 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 He will continue to discuss this with the interventional neuroradiologist but I explained that the appropriate treatment would be observation only for small aneurysms. I do not know his aneurysm size.  On physical exam he does have palpable femoral popliteal graft pulse on the right. He has 2+ femoral pulses bilaterally. He has no tissue loss distally. He did undergo noninvasive study today and this revealed widely patent right femoropopliteal bypass choosing [place] to place 1999 with a interposition revision of the popliteal segment 2010. His ankle arm index in stable bilaterally.  Impression and plan stable lower extremity right leg bypass and moderate arterial insufficiency on the left. He will continue to be seen in our office for yearly protocol followup.

## 2012-05-03 NOTE — Telephone Encounter (Signed)
Pt calls and states he saw dr Shelle Iron at [facility] and dr beane told him there was nothing he could do for him and he should tell his pcp to restart narcotics for him. Pt is ask to have dr beane's office to fax records to [facility] and given fax # [phone]

## 2012-05-04 NOTE — Addendum Note (Signed)
Addended by: Dannielle Karvonen on: 05/04/2012 09:43 AM   Modules accepted: Orders

## 2012-05-06 NOTE — Telephone Encounter (Signed)
Thank you for this note.  We've discussed narcotics before, and this patient is not a candidate for chronic narcotic medications from our clinic.  We will address at the patient's next visit.

## 2012-05-17 ENCOUNTER — Other Ambulatory Visit: Payer: Self-pay | Admitting: Internal Medicine

## 2012-05-31 ENCOUNTER — Other Ambulatory Visit (HOSPITAL_COMMUNITY): Payer: Self-pay | Admitting: Interventional Radiology

## 2012-05-31 DIAGNOSIS — I729 Aneurysm of unspecified site: Secondary | ICD-10-CM

## 2012-06-01 ENCOUNTER — Telehealth (HOSPITAL_COMMUNITY): Payer: Self-pay | Admitting: Interventional Radiology

## 2012-06-01 ENCOUNTER — Ambulatory Visit (HOSPITAL_COMMUNITY): Admission: RE | Admit: 2012-06-01 | Payer: Medicaid Other | Source: Ambulatory Visit

## 2012-06-01 ENCOUNTER — Other Ambulatory Visit (HOSPITAL_COMMUNITY): Payer: Self-pay | Admitting: Interventional Radiology

## 2012-06-01 DIAGNOSIS — I729 Aneurysm of unspecified site: Secondary | ICD-10-CM

## 2012-06-09 ENCOUNTER — Telehealth (HOSPITAL_COMMUNITY): Payer: Self-pay | Admitting: Interventional Radiology

## 2012-06-24 IMAGING — CT CT ABD-PELV W/ CM
2 of 5 series · 14 of 32 positions shown, 19 images · IV contrast (omnipaque)
Comparison: None

CLINICAL DATA: Left-sided pain.  Nausea, vomiting.

CT ABDOMEN AND PELVIS WITH CONTRAST
TECHNIQUE: Multidetector CT imaging of the abdomen and pelvis was
performed following the standard protocol during bolus
administration of intravenous contrast.
Contrast: 80 ml Omnipaque 300 IV.

[Series 2: routine abdomen · axial · 0.98mm/px · z∈[-480,-90]mm · 7 of 105 slices shown, 12 images]
[im 14/105  soft-tissue]
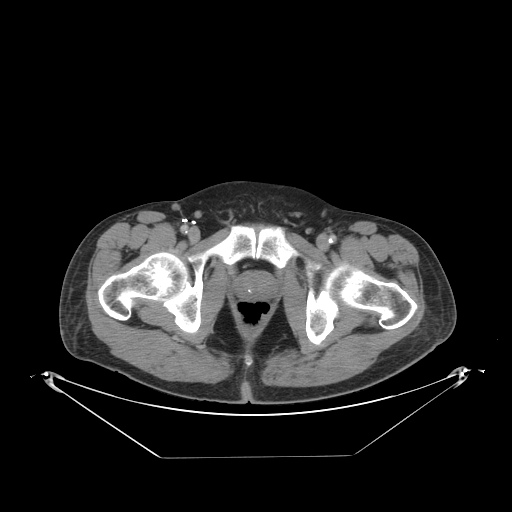
[im 14/105  bone]
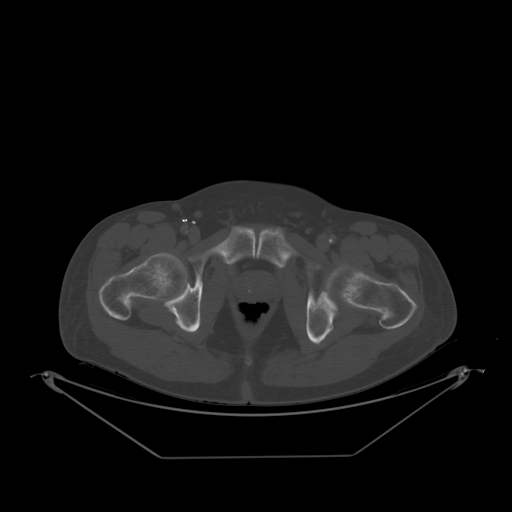
[im 27/105  soft-tissue]
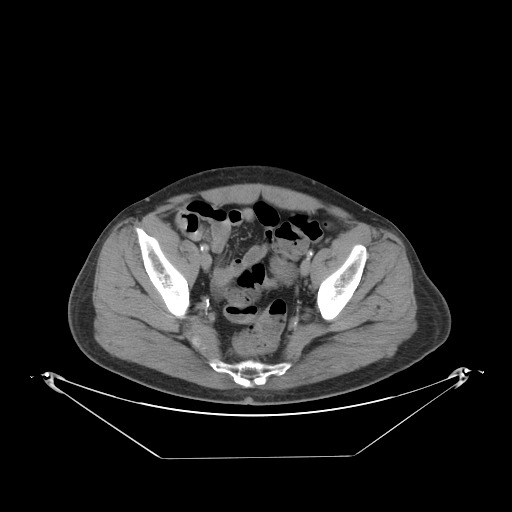
[im 40/105  soft-tissue]
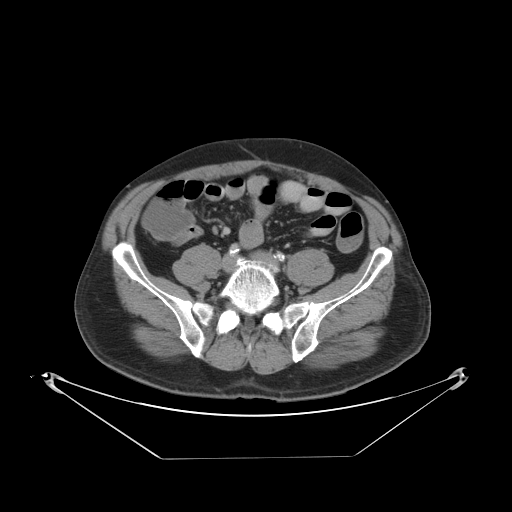
[im 53/105  soft-tissue]
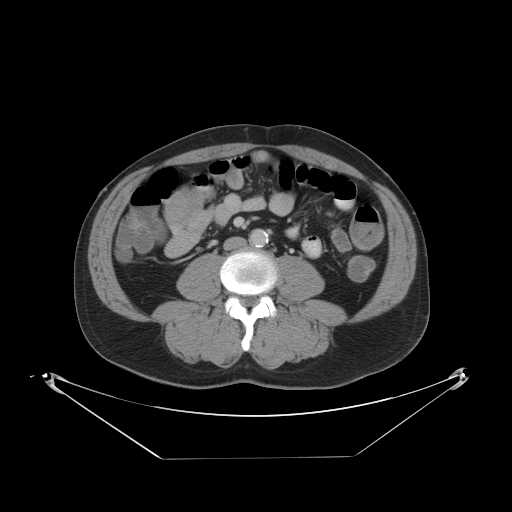
[im 53/105  lung]
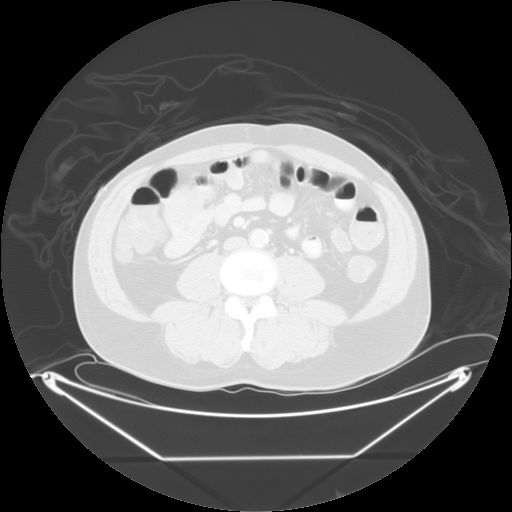
[im 66/105  soft-tissue]
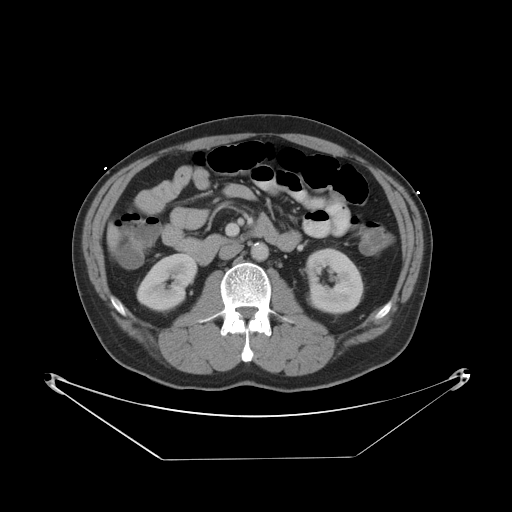
[im 66/105  lung]
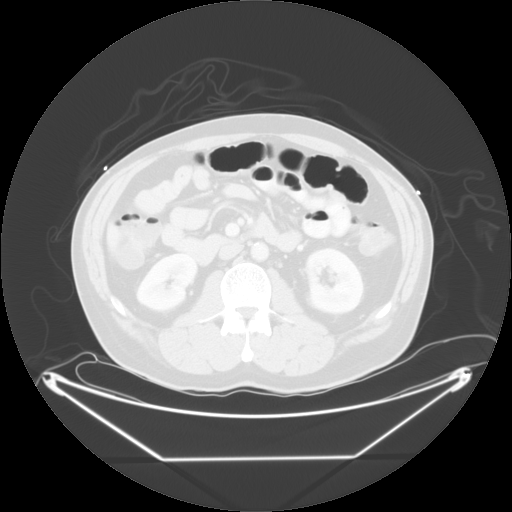
[im 79/105  soft-tissue]
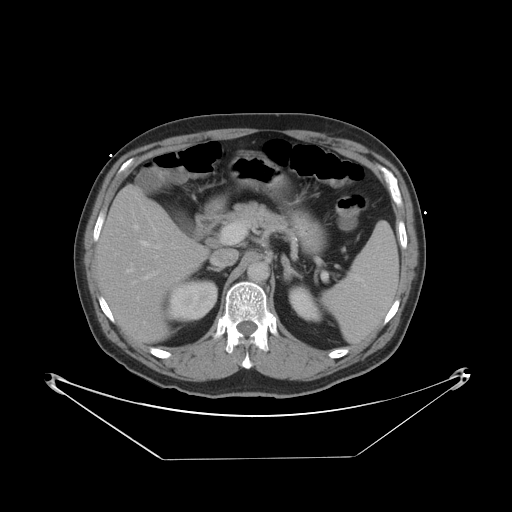
[im 79/105  lung]
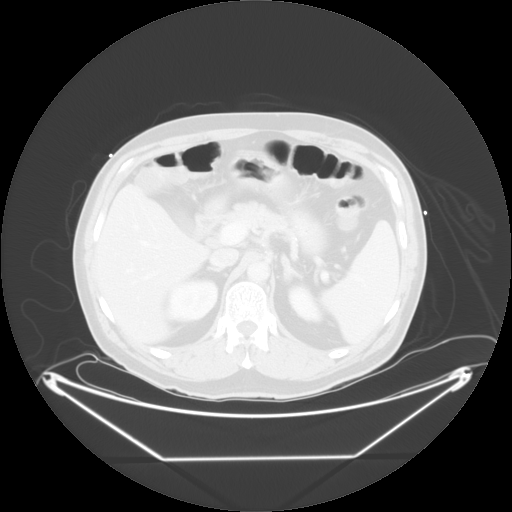
[im 92/105  soft-tissue]
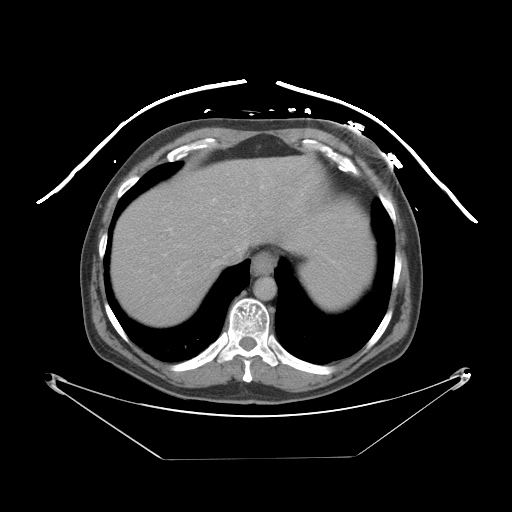
[im 92/105  lung]
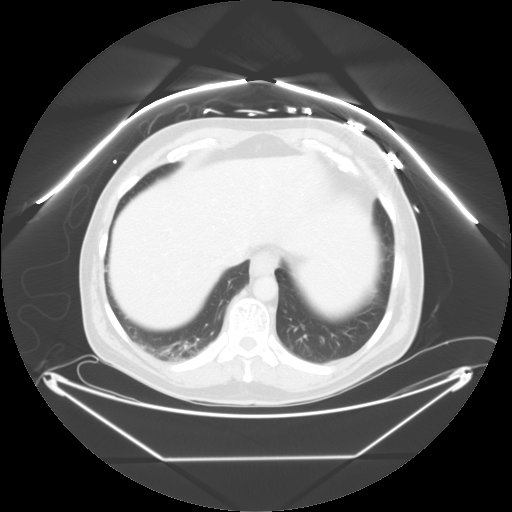

[Series 401: sag · sagittal · 1.04mm/px · 7 of 127 slices shown]
[im 15/127  soft-tissue]
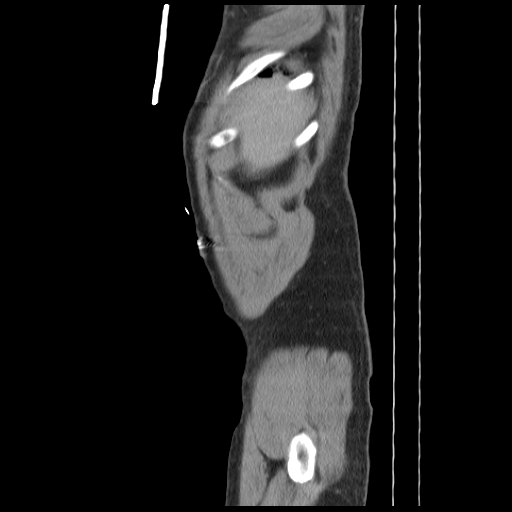
[im 29/127  soft-tissue]
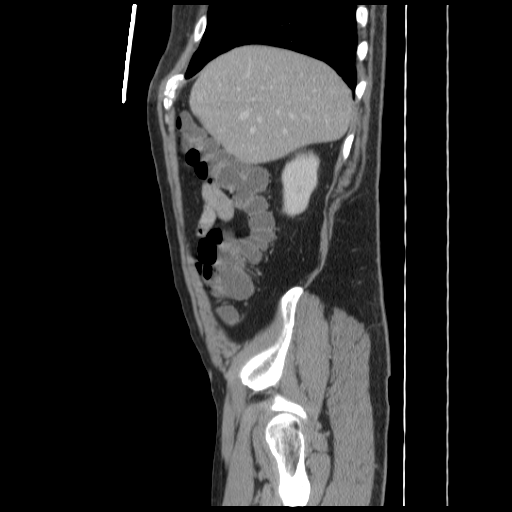
[im 43/127  soft-tissue]
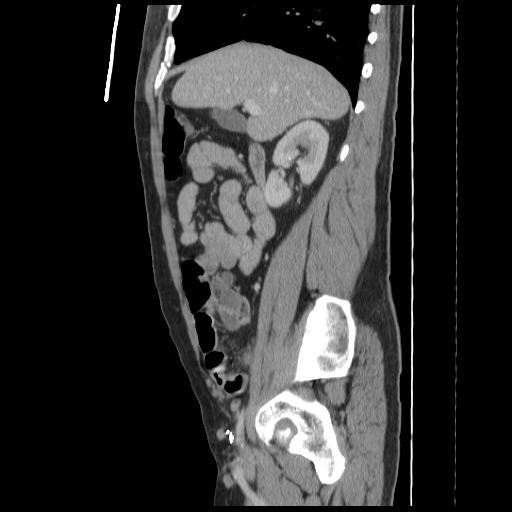
[im 57/127  soft-tissue]
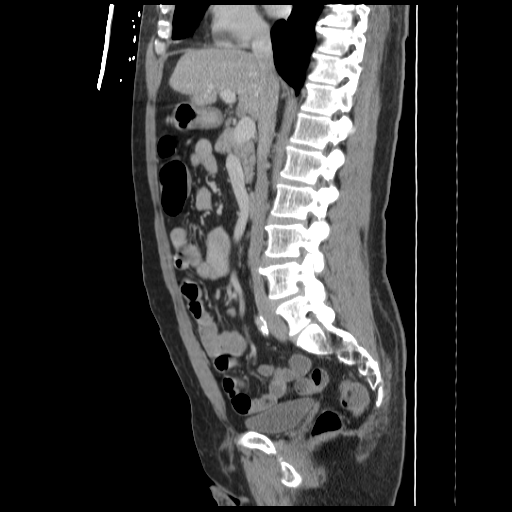
[im 71/127  soft-tissue]
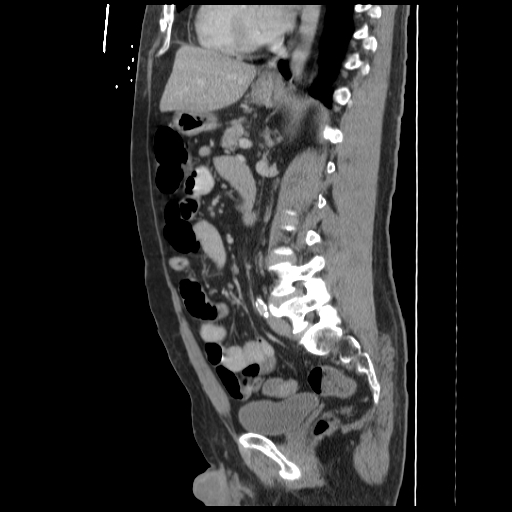
[im 85/127  soft-tissue]
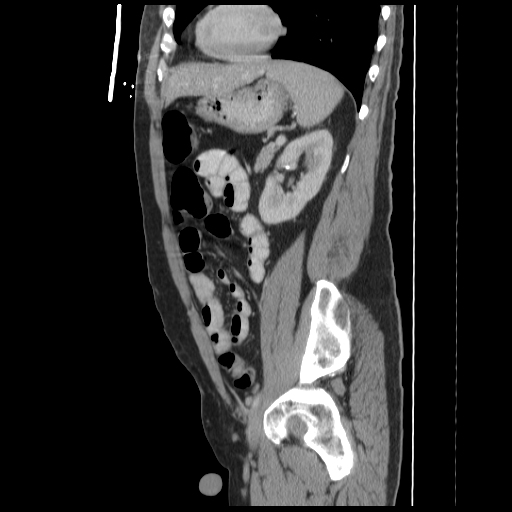
[im 99/127  soft-tissue]
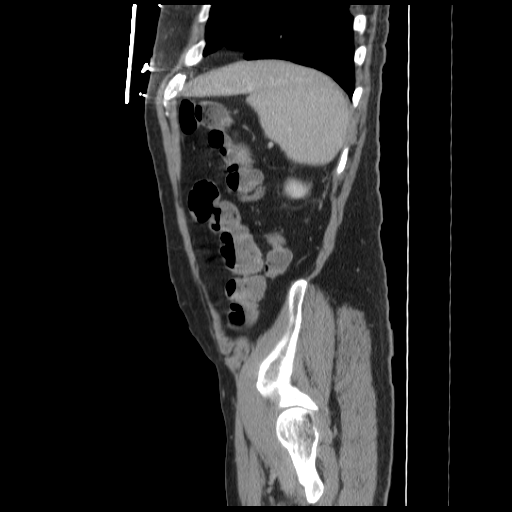

[14 of 32 positions shown; findings below may reference images not displayed]

FINDINGS: There is right base atelectasis.  No effusions.  Heart is
normal size.

Liver, gallbladder, spleen, pancreas, adrenals and kidneys
unremarkable.  Vascular calcifications noted in the renal hila
bilaterally.  No collecting system stones or hydronephrosis.
Ureters decompressed.  Bladder is unremarkable.

Appendix is not visualized.  Small bowel is decompressed.  Colon
grossly unremarkable.  No free fluid, free air or adenopathy.

Aorta and iliac vessels are heavily calcified, non-aneurysmal.

No acute bony abnormality.
IMPRESSION: No acute findings in the abdomen or pelvis.

## 2012-06-30 ENCOUNTER — Telehealth: Payer: Self-pay | Admitting: *Deleted

## 2012-06-30 NOTE — Telephone Encounter (Signed)
Pt called stating that the pain clinic will not accept him.  He is not sure why he is being "ONEOK" He wants an appointment to have his leg amputated, he can't take the pain anymore.  Pt # [phone]

## 2012-06-30 NOTE — Telephone Encounter (Signed)
I returned the patient's phone call.  He confirmed with me that he was interested in getting his leg amputated due to pain, noting frustration with being unable to get narcotic pain medications (due to pain contract violations in the past).  He asks for a referral back to Dr. Arbie Cookey, who has been treating the patient for PVD, as recently as 2 months ago.  I explained that a referral likely wasn't necessary, but would be glad to help the patient make an appointment with Dr. Arbie Cookey, though the decision to amputate would have to be made through a discussion with Dr. Arbie Cookey.

## 2012-07-01 ENCOUNTER — Telehealth (HOSPITAL_COMMUNITY): Payer: Self-pay | Admitting: Interventional Radiology

## 2012-07-01 NOTE — Telephone Encounter (Signed)
Called pt to schedule catheter angiogram, states he is out of town for the next 1 wk but will call me back to schedule when he is back in town [facility]

## 2012-07-13 ENCOUNTER — Other Ambulatory Visit: Payer: Self-pay | Admitting: Radiology

## 2012-07-13 ENCOUNTER — Other Ambulatory Visit (HOSPITAL_COMMUNITY): Payer: Self-pay | Admitting: Interventional Radiology

## 2012-07-13 DIAGNOSIS — I729 Aneurysm of unspecified site: Secondary | ICD-10-CM

## 2012-07-13 DIAGNOSIS — R519 Headache, unspecified: Secondary | ICD-10-CM

## 2012-07-18 ENCOUNTER — Encounter: Payer: Self-pay | Admitting: Vascular Surgery

## 2012-07-19 ENCOUNTER — Encounter: Payer: Self-pay | Admitting: Vascular Surgery

## 2012-07-19 ENCOUNTER — Ambulatory Visit (INDEPENDENT_AMBULATORY_CARE_PROVIDER_SITE_OTHER): Payer: Medicaid Other | Admitting: Vascular Surgery

## 2012-07-19 VITALS — BP 104/71 | HR 77 | Resp 20 | Ht 71.0 in | Wt 165.0 lb

## 2012-07-19 DIAGNOSIS — I739 Peripheral vascular disease, unspecified: Secondary | ICD-10-CM

## 2012-07-19 DIAGNOSIS — M79609 Pain in unspecified limb: Secondary | ICD-10-CM
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
## 2012-07-20 ENCOUNTER — Encounter (HOSPITAL_COMMUNITY): Payer: Self-pay | Admitting: Pharmacy Technician

## 2012-07-21 ENCOUNTER — Ambulatory Visit (HOSPITAL_COMMUNITY)
Admission: RE | Admit: 2012-07-21 | Discharge: 2012-07-21 | Disposition: A | Discharge status: Home / Self Care | Payer: Medicaid Other | Source: Ambulatory Visit | Attending: Interventional Radiology | Admitting: Interventional Radiology

## 2012-07-21 ENCOUNTER — Other Ambulatory Visit: Payer: Self-pay | Admitting: Radiology

## 2012-07-21 ENCOUNTER — Other Ambulatory Visit: Payer: Self-pay

## 2012-07-21 ENCOUNTER — Encounter (HOSPITAL_COMMUNITY): Payer: Self-pay

## 2012-07-21 DIAGNOSIS — Z87828 Personal history of other (healed) physical injury and trauma: Secondary | ICD-10-CM | POA: Insufficient documentation

## 2012-07-21 DIAGNOSIS — I729 Aneurysm of unspecified site: Secondary | ICD-10-CM

## 2012-07-21 DIAGNOSIS — Z8611 Personal history of tuberculosis: Secondary | ICD-10-CM | POA: Insufficient documentation

## 2012-07-21 DIAGNOSIS — F17200 Nicotine dependence, unspecified, uncomplicated: Secondary | ICD-10-CM | POA: Insufficient documentation

## 2012-07-21 DIAGNOSIS — Z9181 History of falling: Secondary | ICD-10-CM | POA: Insufficient documentation

## 2012-07-21 DIAGNOSIS — I6529 Occlusion and stenosis of unspecified carotid artery: Secondary | ICD-10-CM | POA: Insufficient documentation

## 2012-07-21 DIAGNOSIS — Z8673 Personal history of transient ischemic attack (TIA), and cerebral infarction without residual deficits: Secondary | ICD-10-CM | POA: Insufficient documentation

## 2012-07-21 DIAGNOSIS — Z8614 Personal history of Methicillin resistant Staphylococcus aureus infection: Secondary | ICD-10-CM | POA: Insufficient documentation

## 2012-07-21 DIAGNOSIS — I10 Essential (primary) hypertension: Secondary | ICD-10-CM | POA: Insufficient documentation

## 2012-07-21 DIAGNOSIS — N529 Male erectile dysfunction, unspecified: Secondary | ICD-10-CM | POA: Insufficient documentation

## 2012-07-21 DIAGNOSIS — B1920 Unspecified viral hepatitis C without hepatic coma: Secondary | ICD-10-CM | POA: Insufficient documentation

## 2012-07-21 DIAGNOSIS — R519 Headache, unspecified: Secondary | ICD-10-CM

## 2012-07-21 DIAGNOSIS — K219 Gastro-esophageal reflux disease without esophagitis: Secondary | ICD-10-CM | POA: Insufficient documentation

## 2012-07-21 DIAGNOSIS — I6509 Occlusion and stenosis of unspecified vertebral artery: Secondary | ICD-10-CM | POA: Insufficient documentation

## 2012-07-21 DIAGNOSIS — Z91013 Allergy to seafood: Secondary | ICD-10-CM | POA: Insufficient documentation

## 2012-07-21 DIAGNOSIS — E119 Type 2 diabetes mellitus without complications: Secondary | ICD-10-CM | POA: Insufficient documentation

## 2012-07-21 DIAGNOSIS — E785 Hyperlipidemia, unspecified: Secondary | ICD-10-CM | POA: Insufficient documentation

## 2012-07-21 DIAGNOSIS — I658 Occlusion and stenosis of other precerebral arteries: Secondary | ICD-10-CM | POA: Insufficient documentation

## 2012-07-21 DIAGNOSIS — H538 Other visual disturbances: Secondary | ICD-10-CM | POA: Insufficient documentation

## 2012-07-21 DIAGNOSIS — J449 Chronic obstructive pulmonary disease, unspecified: Secondary | ICD-10-CM | POA: Insufficient documentation

## 2012-07-21 DIAGNOSIS — Z888 Allergy status to other drugs, medicaments and biological substances status: Secondary | ICD-10-CM | POA: Insufficient documentation

## 2012-07-21 DIAGNOSIS — J4489 Other specified chronic obstructive pulmonary disease: Secondary | ICD-10-CM | POA: Insufficient documentation

## 2012-07-21 DIAGNOSIS — Z538 Procedure and treatment not carried out for other reasons: Secondary | ICD-10-CM | POA: Insufficient documentation

## 2012-07-21 LAB — CBC WITH DIFFERENTIAL/PLATELET
Basophils Absolute: 0.1 10*3/uL (ref 0.0–0.1)
Basophils Relative: 1 % (ref 0–1)
Eosinophils Absolute: 0.5 10*3/uL (ref 0.0–0.7)
Hemoglobin: 15 g/dL (ref 13.0–17.0)
MCH: 32 pg (ref 26.0–34.0)
MCHC: 34.8 g/dL (ref 30.0–36.0)
Monocytes Relative: 8 % (ref 3–12)
Neutro Abs: 3.2 10*3/uL (ref 1.7–7.7)
Neutrophils Relative %: 46 % (ref 43–77)
Platelets: 220 10*3/uL (ref 150–400)
RDW: 13.3 % (ref 11.5–15.5)

## 2012-07-21 LAB — BASIC METABOLIC PANEL
BUN: 16 mg/dL (ref 6–23)
Calcium: 9.3 mg/dL (ref 8.4–10.5)
GFR calc Af Amer: >90 mL/min (ref >90)
GFR calc non Af Amer: >90 mL/min (ref >90)
Glucose, Bld: 107 mg/dL — ABNORMAL HIGH (ref 70–99)
Potassium: 4.2 mEq/L (ref 3.5–5.1)

## 2012-07-21 LAB — GLUCOSE, CAPILLARY: Glucose-Capillary: 96 mg/dL (ref 70–99)

## 2012-07-21 LAB — PROTIME-INR
INR: 1.03 (ref 0.00–1.49)
Prothrombin Time: 13.3 seconds (ref 11.6–15.2)

## 2012-07-21 MED ORDER — OXYCODONE-ACETAMINOPHEN 5-325 MG PO TABS
ORAL_TABLET | ORAL | Status: AC
  Filled 2012-07-21: qty 2

## 2012-07-21 MED ORDER — OXYCODONE-ACETAMINOPHEN 5-325 MG PO TABS
2.0000 | ORAL_TABLET | Freq: Once | ORAL | Status: AC
  Administered 2012-07-21: 2 via ORAL

## 2012-07-21 MED ORDER — SODIUM CHLORIDE 0.9 % IV SOLN: Freq: Once | INTRAVENOUS | Status: DC
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
## 2012-07-22 ENCOUNTER — Ambulatory Visit (HOSPITAL_COMMUNITY)
Admission: RE | Admit: 2012-07-22 | Discharge: 2012-07-22 | Disposition: A | Discharge status: Home / Self Care | Payer: Medicaid Other | Source: Ambulatory Visit | Attending: Interventional Radiology | Admitting: Interventional Radiology

## 2012-07-22 ENCOUNTER — Other Ambulatory Visit: Payer: Self-pay | Admitting: Radiology

## 2012-07-22 ENCOUNTER — Encounter (HOSPITAL_COMMUNITY): Payer: Self-pay

## 2012-07-22 DIAGNOSIS — G8921 Chronic pain due to trauma: Secondary | ICD-10-CM | POA: Insufficient documentation

## 2012-07-22 DIAGNOSIS — E119 Type 2 diabetes mellitus without complications: Secondary | ICD-10-CM | POA: Insufficient documentation

## 2012-07-22 DIAGNOSIS — K219 Gastro-esophageal reflux disease without esophagitis: Secondary | ICD-10-CM | POA: Insufficient documentation

## 2012-07-22 DIAGNOSIS — B1920 Unspecified viral hepatitis C without hepatic coma: Secondary | ICD-10-CM | POA: Insufficient documentation

## 2012-07-22 DIAGNOSIS — I671 Cerebral aneurysm, nonruptured: Secondary | ICD-10-CM | POA: Insufficient documentation

## 2012-07-22 DIAGNOSIS — J4489 Other specified chronic obstructive pulmonary disease: Secondary | ICD-10-CM | POA: Insufficient documentation

## 2012-07-22 DIAGNOSIS — N529 Male erectile dysfunction, unspecified: Secondary | ICD-10-CM | POA: Insufficient documentation

## 2012-07-22 DIAGNOSIS — Z91013 Allergy to seafood: Secondary | ICD-10-CM | POA: Insufficient documentation

## 2012-07-22 DIAGNOSIS — E785 Hyperlipidemia, unspecified: Secondary | ICD-10-CM | POA: Insufficient documentation

## 2012-07-22 DIAGNOSIS — I739 Peripheral vascular disease, unspecified: Secondary | ICD-10-CM | POA: Insufficient documentation

## 2012-07-22 DIAGNOSIS — G4730 Sleep apnea, unspecified: Secondary | ICD-10-CM | POA: Insufficient documentation

## 2012-07-22 DIAGNOSIS — M79609 Pain in unspecified limb: Secondary | ICD-10-CM | POA: Insufficient documentation

## 2012-07-22 DIAGNOSIS — Z888 Allergy status to other drugs, medicaments and biological substances status: Secondary | ICD-10-CM | POA: Insufficient documentation

## 2012-07-22 DIAGNOSIS — I6509 Occlusion and stenosis of unspecified vertebral artery: Secondary | ICD-10-CM | POA: Insufficient documentation

## 2012-07-22 DIAGNOSIS — Z8673 Personal history of transient ischemic attack (TIA), and cerebral infarction without residual deficits: Secondary | ICD-10-CM | POA: Insufficient documentation

## 2012-07-22 DIAGNOSIS — I10 Essential (primary) hypertension: Secondary | ICD-10-CM | POA: Insufficient documentation

## 2012-07-22 DIAGNOSIS — I6529 Occlusion and stenosis of unspecified carotid artery: Secondary | ICD-10-CM | POA: Insufficient documentation

## 2012-07-22 DIAGNOSIS — Z01812 Encounter for preprocedural laboratory examination: Secondary | ICD-10-CM | POA: Insufficient documentation

## 2012-07-22 DIAGNOSIS — F17200 Nicotine dependence, unspecified, uncomplicated: Secondary | ICD-10-CM | POA: Insufficient documentation

## 2012-07-22 DIAGNOSIS — J449 Chronic obstructive pulmonary disease, unspecified: Secondary | ICD-10-CM | POA: Insufficient documentation

## 2012-07-22 LAB — GLUCOSE, CAPILLARY: Glucose-Capillary: 99 mg/dL (ref 70–99)

## 2012-07-22 MED ORDER — SODIUM CHLORIDE 0.9 % IV SOLN
Freq: Once | INTRAVENOUS | Status: AC
  Administered 2012-07-22: 10:00:00 via INTRAVENOUS

## 2012-07-22 MED ORDER — OXYCODONE-ACETAMINOPHEN 5-325 MG PO TABS
1.0000 | ORAL_TABLET | Freq: Once | ORAL | Status: AC
  Administered 2012-07-22: 1 via ORAL
  Filled 2012-07-22: qty 1
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
## 2012-07-25 ENCOUNTER — Encounter (HOSPITAL_COMMUNITY): Payer: Self-pay | Admitting: Pharmacy Technician

## 2012-07-26 IMAGING — CR DG CHEST 2V
2 series · 2 of 2 positions shown · non-contrast
Comparison: Chest x-ray 10/28/2009

CLINICAL DATA: Preop angioplasty

CHEST - 2 VIEW

[view not recorded (1 of 2)]
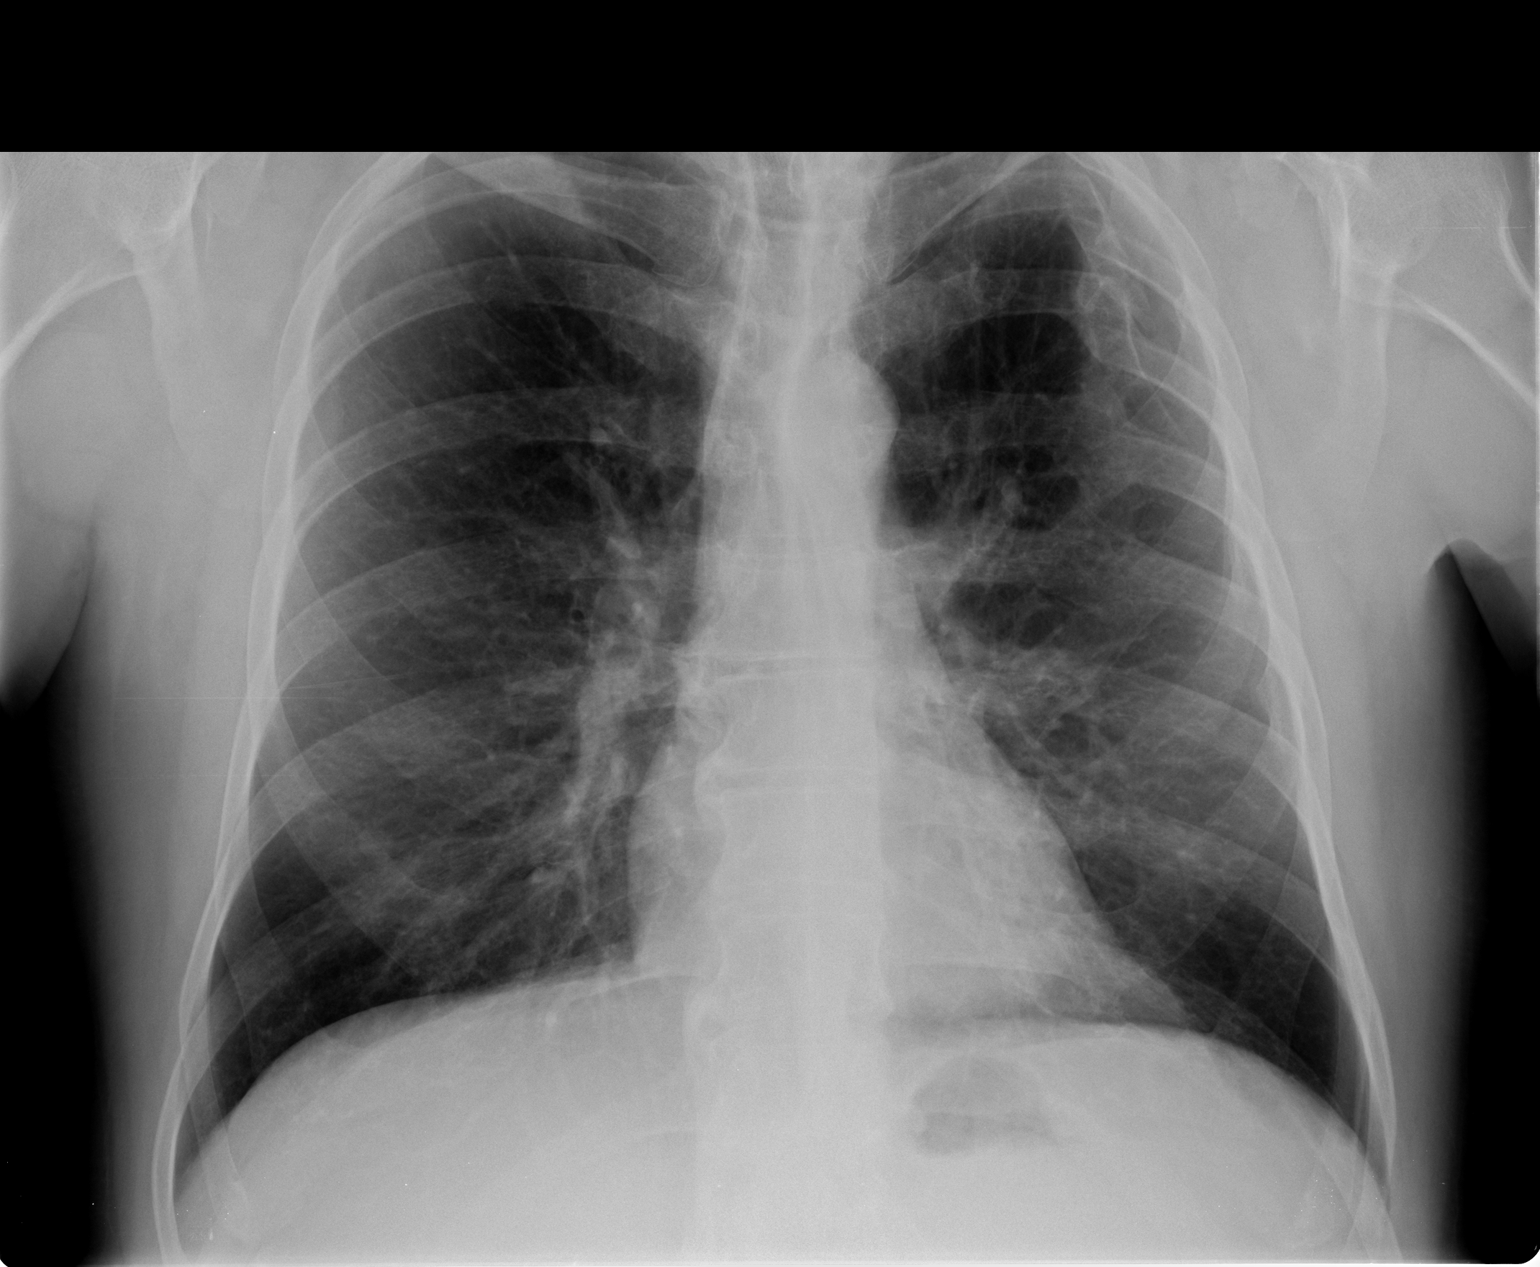

[view not recorded (2 of 2)]
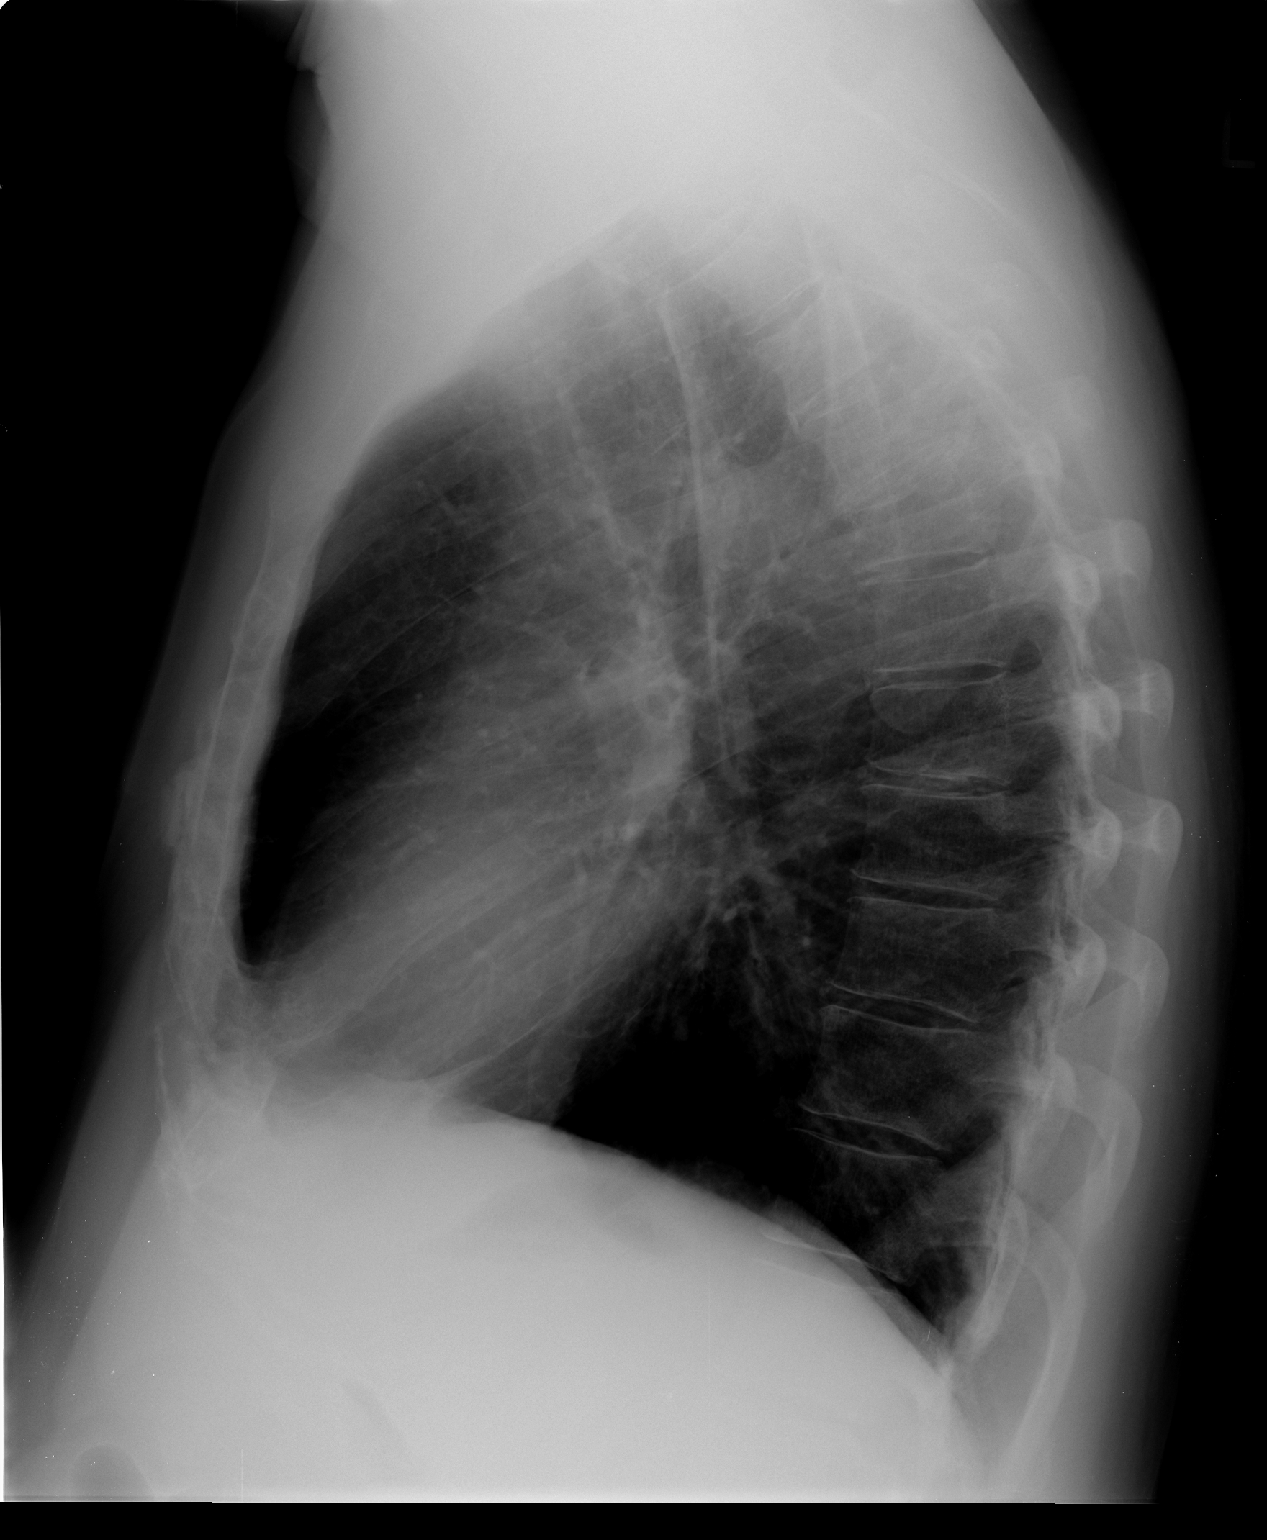

[2 of 2 positions shown; findings below may reference images not displayed]

FINDINGS: Normal heart, mediastinal, and hilar contours.  The lungs
are clear.  There is no pleural effusion or pneumothorax.  Remote
healed left clavicle and upper left rib fractures are stable.
Slight superior endplate compression deformity of a mid thoracic
spine vertebral body, possibly T5, is stable.  No acute bony
abnormality.
IMPRESSION: No acute cardiopulmonary disease.

## 2012-07-27 ENCOUNTER — Other Ambulatory Visit: Payer: Self-pay | Admitting: Radiology

## 2012-07-28 ENCOUNTER — Other Ambulatory Visit (HOSPITAL_COMMUNITY): Payer: Self-pay | Admitting: Interventional Radiology

## 2012-07-28 ENCOUNTER — Ambulatory Visit (HOSPITAL_COMMUNITY): Admission: RE | Admit: 2012-07-28 | Payer: Medicaid Other | Source: Ambulatory Visit

## 2012-07-28 DIAGNOSIS — I729 Aneurysm of unspecified site: Secondary | ICD-10-CM

## 2012-08-02 ENCOUNTER — Telehealth: Payer: Self-pay | Admitting: Vascular Surgery

## 2012-08-02 ENCOUNTER — Ambulatory Visit (HOSPITAL_COMMUNITY): Admission: RE | Admit: 2012-08-02 | Payer: Medicaid Other | Source: Ambulatory Visit

## 2012-08-02 NOTE — Telephone Encounter (Signed)
Per Dr Arbie Cookey- Mr [name] needs to see Dr Myrene Galas prior to arranging an amputation.  I have spoken with [name] at Dr Ardeth Perfect office and an appt has been scheduled for 08/10/2012 @ 2:45pm.   A message has been left on Mr [name] cell and home number. I have asked that he call and ask for [facility]. I also left his appointment information on his cell number.

## 2012-08-03 ENCOUNTER — Other Ambulatory Visit (HOSPITAL_COMMUNITY): Payer: Self-pay | Admitting: Interventional Radiology

## 2012-08-03 ENCOUNTER — Ambulatory Visit (HOSPITAL_COMMUNITY)
Admission: RE | Admit: 2012-08-03 | Discharge: 2012-08-03 | Disposition: A | Discharge status: Home / Self Care | Payer: Medicaid Other | Source: Ambulatory Visit | Attending: Interventional Radiology | Admitting: Interventional Radiology

## 2012-08-03 DIAGNOSIS — E785 Hyperlipidemia, unspecified: Secondary | ICD-10-CM | POA: Insufficient documentation

## 2012-08-03 DIAGNOSIS — E119 Type 2 diabetes mellitus without complications: Secondary | ICD-10-CM | POA: Insufficient documentation

## 2012-08-03 DIAGNOSIS — I10 Essential (primary) hypertension: Secondary | ICD-10-CM | POA: Insufficient documentation

## 2012-08-03 DIAGNOSIS — I658 Occlusion and stenosis of other precerebral arteries: Secondary | ICD-10-CM | POA: Insufficient documentation

## 2012-08-03 DIAGNOSIS — B1920 Unspecified viral hepatitis C without hepatic coma: Secondary | ICD-10-CM | POA: Insufficient documentation

## 2012-08-03 DIAGNOSIS — I739 Peripheral vascular disease, unspecified: Secondary | ICD-10-CM | POA: Insufficient documentation

## 2012-08-03 DIAGNOSIS — J449 Chronic obstructive pulmonary disease, unspecified: Secondary | ICD-10-CM | POA: Insufficient documentation

## 2012-08-03 DIAGNOSIS — I671 Cerebral aneurysm, nonruptured: Secondary | ICD-10-CM | POA: Insufficient documentation

## 2012-08-03 DIAGNOSIS — I729 Aneurysm of unspecified site: Secondary | ICD-10-CM

## 2012-08-03 DIAGNOSIS — J4489 Other specified chronic obstructive pulmonary disease: Secondary | ICD-10-CM | POA: Insufficient documentation

## 2012-08-03 DIAGNOSIS — Z8673 Personal history of transient ischemic attack (TIA), and cerebral infarction without residual deficits: Secondary | ICD-10-CM | POA: Insufficient documentation

## 2012-08-03 DIAGNOSIS — G894 Chronic pain syndrome: Secondary | ICD-10-CM | POA: Insufficient documentation

## 2012-08-03 DIAGNOSIS — G4730 Sleep apnea, unspecified: Secondary | ICD-10-CM | POA: Insufficient documentation

## 2012-08-03 DIAGNOSIS — F17200 Nicotine dependence, unspecified, uncomplicated: Secondary | ICD-10-CM | POA: Insufficient documentation

## 2012-08-03 DIAGNOSIS — K219 Gastro-esophageal reflux disease without esophagitis: Secondary | ICD-10-CM | POA: Insufficient documentation

## 2012-08-03 DIAGNOSIS — I6509 Occlusion and stenosis of unspecified vertebral artery: Secondary | ICD-10-CM | POA: Insufficient documentation

## 2012-08-03 DIAGNOSIS — I6529 Occlusion and stenosis of unspecified carotid artery: Secondary | ICD-10-CM | POA: Insufficient documentation

## 2012-08-03 LAB — CBC WITH DIFFERENTIAL/PLATELET
Eosinophils Relative: 8 % — ABNORMAL HIGH (ref 0–5)
HCT: 46.1 % (ref 39.0–52.0)
Lymphocytes Relative: 36 % (ref 12–46)
Lymphs Abs: 2.7 10*3/uL (ref 0.7–4.0)
MCV: 91.1 fL (ref 78.0–100.0)
Monocytes Absolute: 0.5 10*3/uL (ref 0.1–1.0)
Platelets: 218 10*3/uL (ref 150–400)
RBC: 5.06 MIL/uL (ref 4.22–5.81)
WBC: 7.7 10*3/uL (ref 4.0–10.5)

## 2012-08-03 LAB — BASIC METABOLIC PANEL
CO2: 25 mEq/L (ref 19–32)
Chloride: 103 mEq/L (ref 96–112)
Potassium: 4.1 mEq/L (ref 3.5–5.1)
Sodium: 138 mEq/L (ref 135–145)

## 2012-08-03 LAB — PROTIME-INR: Prothrombin Time: 13.2 seconds (ref 11.6–15.2)

## 2012-08-03 LAB — APTT: aPTT: 34 seconds (ref 24–37)

## 2012-08-03 IMAGING — XA IR ANGIO/CAROTID/CERV/UNI*R*
1 series · 12 of 24 positions shown · IV contrast (IODINE)
Comparison: MRI MRA of the brain of 05/22/2009 and catheter
angiogram of 08/20/2008.

CLINICAL DATA: Patient with occluded left internal carotid
artery, and suggestion of progression of right internal carotid
artery intracranial stenoses by MRA examination.

BILATERAL CAROTID ARTERIOGRAPHY AND BILATERAL VERTEBRAL ARTERY
ANGIOGRAMS

[Series 300: sp intra cran stent · 12 of 145 slices shown]
[im 7/145]
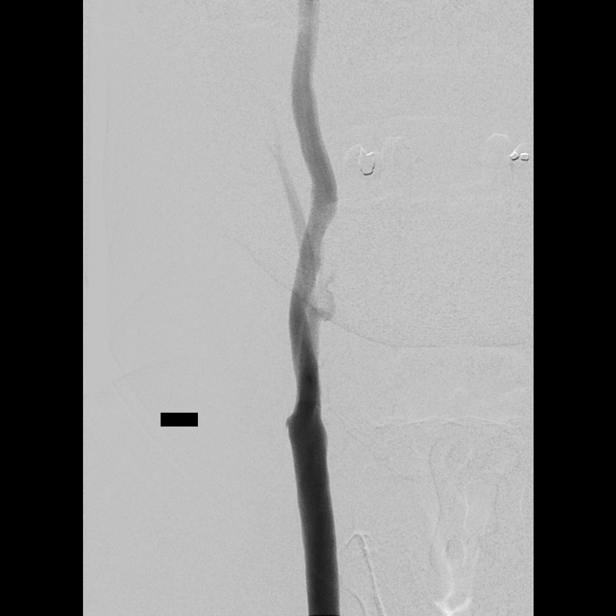
[im 19/145]
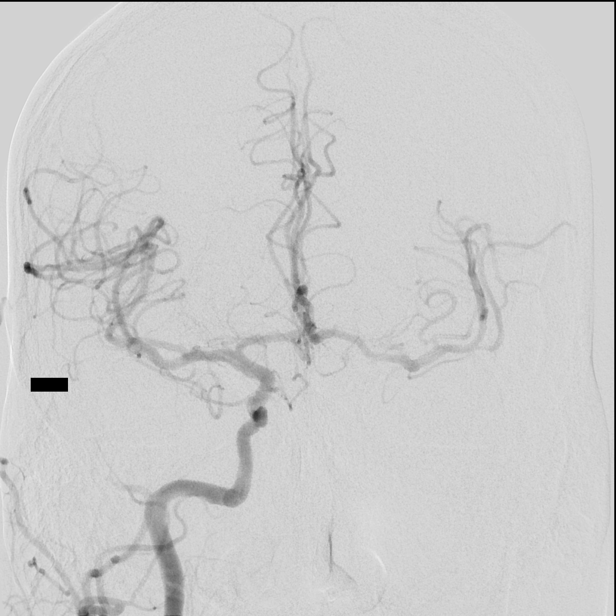
[im 32/145]
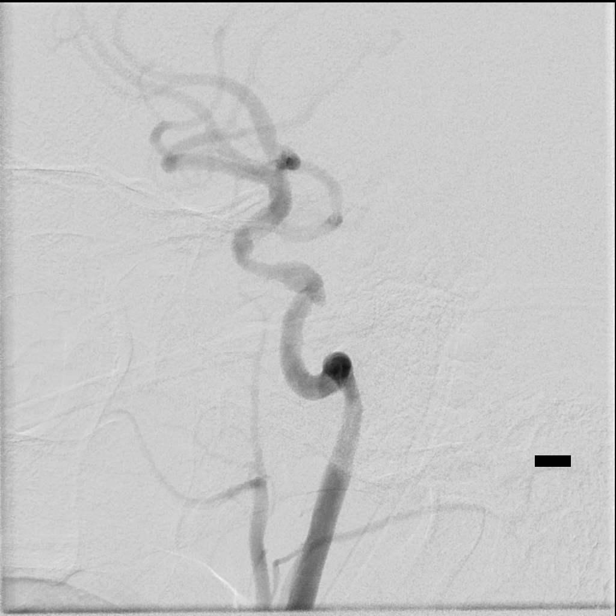
[im 44/145]
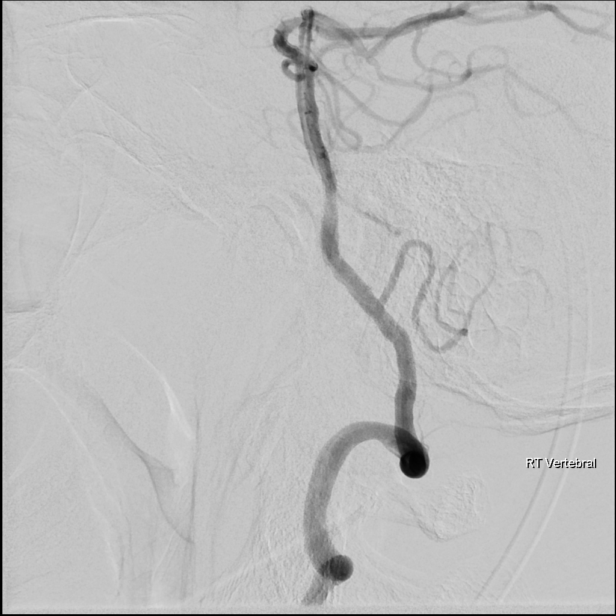
[im 57/145]
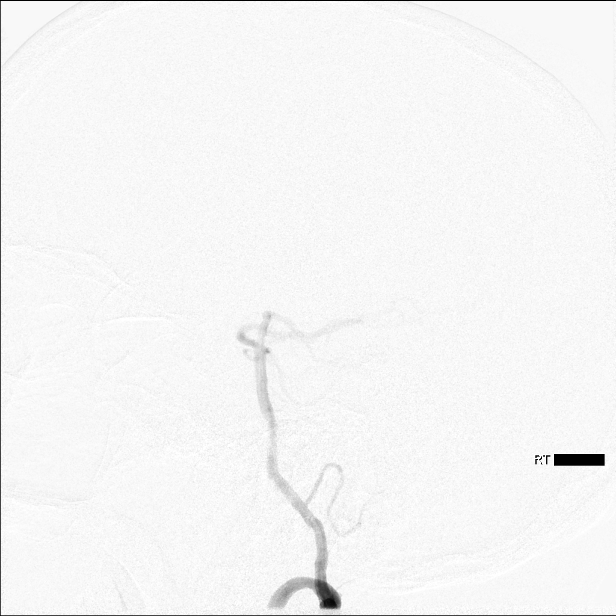
[im 69/145]
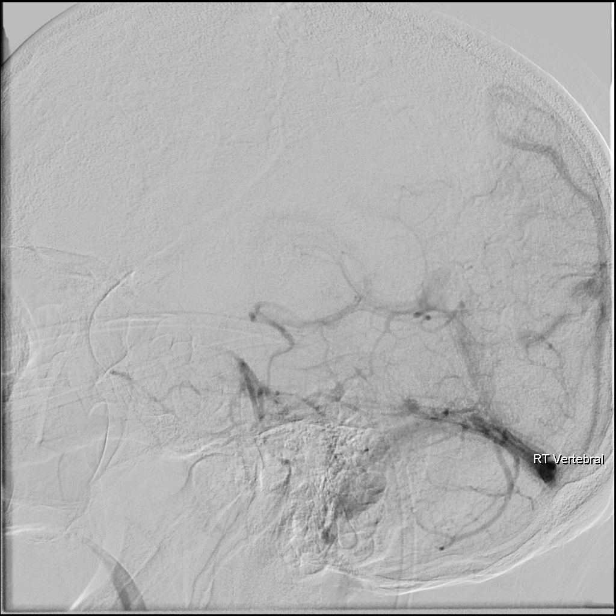
[im 82/145]
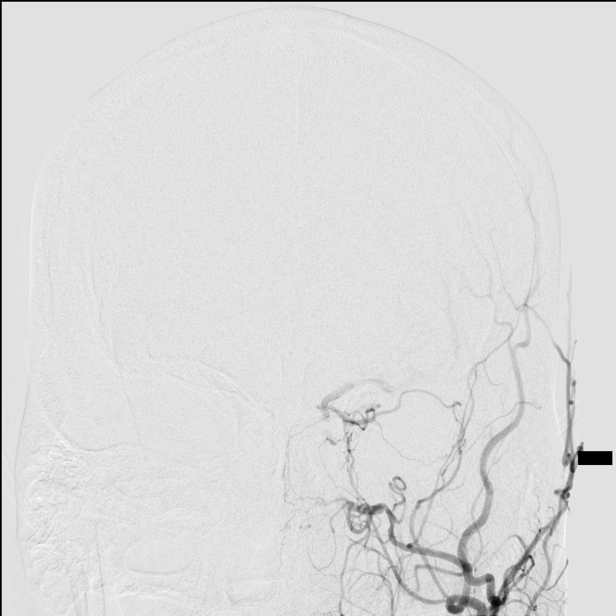
[im 94/145]
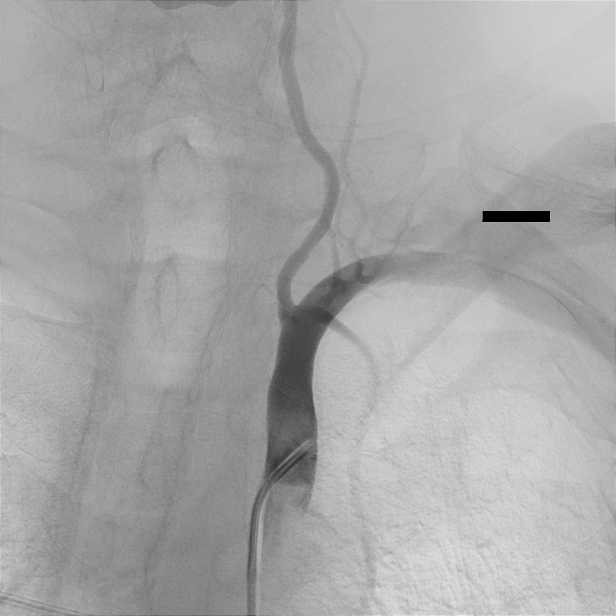
[im 107/145]
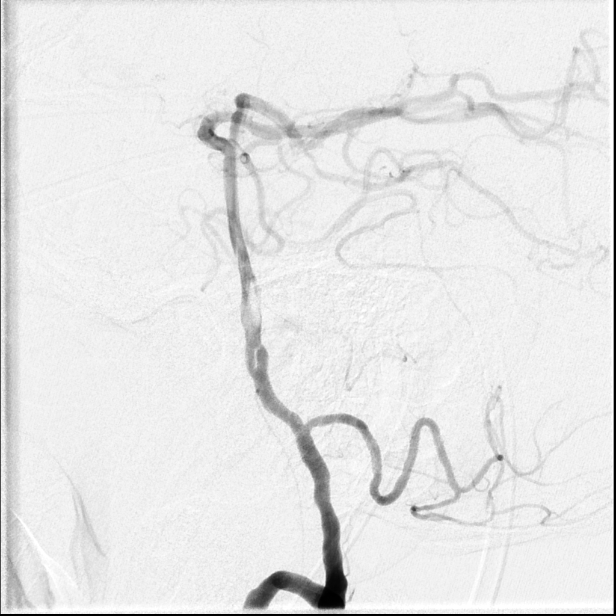
[im 119/145]
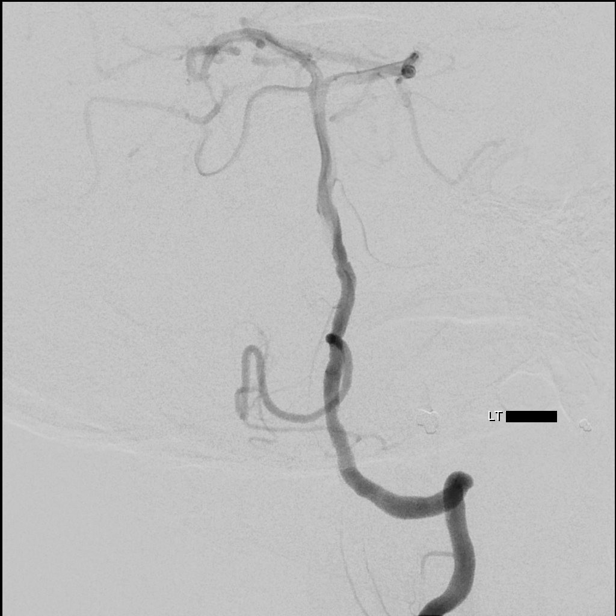
[im 132/145]
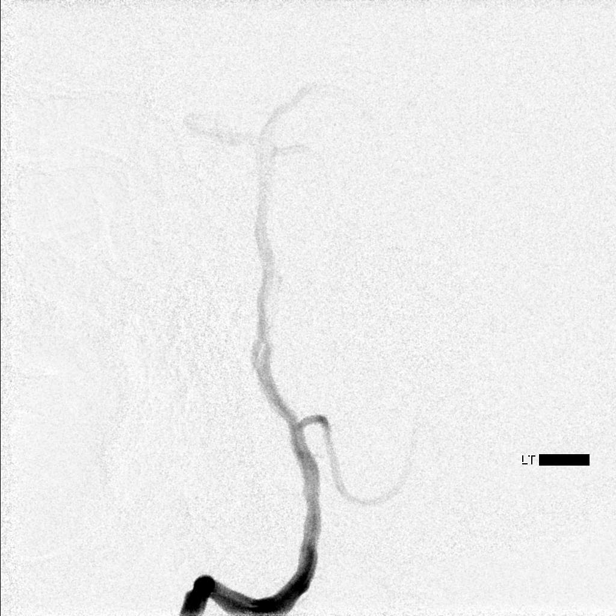
[im 145/145]
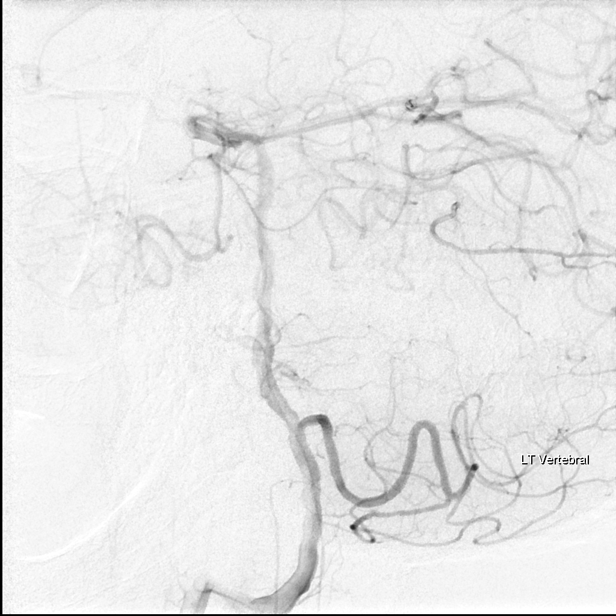

[12 of 24 positions shown; findings below may reference images not displayed]

Following a full explanation of the procedure along with the
potential associated complications, an informed witnessed consent
was obtained.

The right groin was prepped and draped in the usual sterile
fashion.  Thereafter using a modified Seldinger technique,
transfemoral access into the right common femoral artery was
obtained without difficulty.  Over a 0.035-inch guidewire, a 5-
French Pinnacle sheath was inserted.  Through this and also over a
0.035-inch guidewire, a 5-French JB1 catheter was advanced to the
aortic arch region and selectively positioned in the right common
carotid artery, the right vertebral artery origin, the left common
carotid artery and the left vertebral artery.

There were no acute complications.  The patient tolerated the
procedure well.

Medications utilized: Versed 1 mg IV.  Fentanyl 50 mcg IV.

Contrast: Omnipaque 300 approximately 65 ml.
FINDINGS: The right common carotid arteriogram demonstrates the
right external carotid artery origin to be normal.  Its branches
are also normally opacified.

The left internal carotid artery at the bulb has a smooth shallow
plaque along its posterolateral wall with less than 10% stenosis by
the NASCET criteria.

The vessel is otherwise seen to opacify normally to the cranial
skull base.

The right internal carotid artery at the cervical-petrous junction
is normal.

There is diffuse atherosclerotic disease involving the petrous,
cavernous and the proximal cavernous region, associated with
approximately 50% stenosis.

This is associated with a small pseudoaneurysm measuring
approximately 2-3 mm.

The supraclinoid right ICA is normal.

The right middle and the right anterior cerebral arteries opacify
normally into the capillary and the venous phases.

Almost simultaneous opacification is seen of the left middle and
the left anterior cerebral artery distributions via the anterior
communicating artery, from the right internal carotid injection.

The right vertebral artery has a mild approximately 20% stenosis at
its origin.  The vessel is otherwise seen to opacify normally to
the cranial skull base.  The right posterior inferior cerebellar
artery demonstrates mild caliber irregularity in its mid one-third.

The right vertebrobasilar junction is otherwise normally opacified.

The basilar artery, the posterior cerebral arteries, the superior
cerebellar arteries and the anterior-inferior cerebellar arteries
opacify into the capillary and venous phases.

There is a mild narrowing of the proximal basilar artery.

The delayed arterial phase demonstrates prominent leptomeningeal
collateralization from the P3 regions opacifying the left posterior
parietal lobe.

The left common carotid arteriogram demonstrates complete
angiographic occlusion of the left internal carotid artery.  There
is partial distal reconstitution of the left internal carotid
artery in the distal cavernous region from the ipsilateral
ophthalmic artery from the nasoethmoidal branches.  Antegrade flow
into the left middle cerebral artery is noted with subsequent
partial opacification of the left MCA branches.  Unopacified blood
is seen from the contralateral internal carotid artery via the
anterior communicating artery.

The left vertebral artery origin is normal.  The vessel is seen to
opacify normally to the cranial skull base.  There is mild caliber
irregularity of the left vertebrobasilar junction proximal to the
left posterior inferior cerebellar artery.  The PICA opacifies
normally.

The left vertebrobasilar junction demonstrates a fenestration
unchanged from the previous examination.  Inflow of unopacified
blood is seen in the proximal basilar artery.

The opacified portion of the basilar artery, the posterior cerebral
arteries, the superior cerebellar arteries and the anterior
inferior cerebellar arteries is normal into the capillary and
venous phases.

IMPRESSION
1.  50% stenosis of the right internal carotid artery proximal
cavernous segment associated with a small pseudoaneurysm unchanged.
2. Less than 20% stenosis of the right vertebral artery at its
origin.
3.  Angiographically occluded left internal carotid artery at the
bulb with distal reconstitution from the ipsilateral external
collaterals via the ophthalmic artery.

The angiographic findings were reviewed with the patient.  Since
the patient remains essentially asymptomatic with stable angiograph
findings, it was elected to continue with conservative management
with aspirin and Plavix.

A follow-up MRI MRA of the brain will be undertaken in 6 months
from today.

## 2012-08-03 MED ORDER — OXYCODONE-ACETAMINOPHEN 5-325 MG PO TABS
ORAL_TABLET | ORAL | Status: AC
  Filled 2012-08-03: qty 2

## 2012-08-03 MED ORDER — HEPARIN SOD (PORK) LOCK FLUSH 100 UNIT/ML IV SOLN
INTRAVENOUS | Status: AC | PRN
  Administered 2012-08-03: 500 [IU] via INTRAVENOUS

## 2012-08-03 MED ORDER — MIDAZOLAM HCL 2 MG/2ML IJ SOLN
INTRAMUSCULAR | Status: AC
  Filled 2012-08-03: qty 4

## 2012-08-03 MED ORDER — OXYCODONE-ACETAMINOPHEN 5-325 MG PO TABS
2.0000 | ORAL_TABLET | ORAL | Status: DC | PRN
  Administered 2012-08-03 (×2): 2 via ORAL

## 2012-08-03 MED ORDER — FENTANYL CITRATE 0.05 MG/ML IJ SOLN
INTRAMUSCULAR | Status: AC
  Filled 2012-08-03: qty 4

## 2012-08-03 MED ORDER — FENTANYL CITRATE 0.05 MG/ML IJ SOLN
INTRAMUSCULAR | Status: AC | PRN
  Administered 2012-08-03 (×2): 25 ug via INTRAVENOUS

## 2012-08-03 MED ORDER — DIPHENHYDRAMINE HCL 50 MG/ML IJ SOLN
INTRAMUSCULAR | Status: AC
  Filled 2012-08-03: qty 1

## 2012-08-03 MED ORDER — IOHEXOL 300 MG/ML  SOLN
150.0000 mL | Freq: Once | INTRAMUSCULAR | Status: AC | PRN
  Administered 2012-08-03: 75 mL via INTRAVENOUS

## 2012-08-03 MED ORDER — MIDAZOLAM HCL 2 MG/2ML IJ SOLN
INTRAMUSCULAR | Status: AC | PRN
  Administered 2012-08-03: 1 mg via INTRAVENOUS

## 2012-08-03 MED ORDER — METHYLPREDNISOLONE SODIUM SUCC 125 MG IJ SOLR: 125.0000 mg | Freq: Once | INTRAMUSCULAR | Status: DC

## 2012-08-03 MED ORDER — SODIUM CHLORIDE 0.9 % IV SOLN: INTRAVENOUS | Status: DC

## 2012-08-03 MED ORDER — SODIUM CHLORIDE 0.9 % IV SOLN: Freq: Once | INTRAVENOUS | Status: DC

## 2012-08-03 MED ORDER — METHYLPREDNISOLONE SODIUM SUCC 125 MG IJ SOLR
INTRAMUSCULAR | Status: AC
  Administered 2012-08-03: 125 mg via INTRAVENOUS
  Filled 2012-08-03: qty 2

## 2012-08-03 NOTE — ED Notes (Signed)
Reported to [place], Charity fundraiser

## 2012-08-03 NOTE — ED Notes (Signed)
Sheath removed from left groin; pressure held.

## 2012-08-03 NOTE — Procedures (Signed)
S/P 4 vessel cerebral arteriogram . Lt CFa appoach.  Findings.. Occluded LT ICA prox with distal partial reconstitution of cavernous seg from ipsilateral Ophthalmic artery. 50% stenosis of RT ICA cavernous segment. 60 to 65% stenosis of dominant RT vert artery at origin. 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

## 2012-08-03 NOTE — Progress Notes (Signed)
(  L) groin dressing changed. V-pad removed. Site unremarkable
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
## 2012-08-10 ENCOUNTER — Other Ambulatory Visit: Payer: Self-pay | Admitting: Internal Medicine

## 2012-08-24 ENCOUNTER — Other Ambulatory Visit: Payer: Self-pay | Admitting: *Deleted

## 2012-08-29 ENCOUNTER — Telehealth: Payer: Self-pay | Admitting: *Deleted

## 2012-08-29 DIAGNOSIS — G8929 Other chronic pain: Secondary | ICD-10-CM

## 2012-08-29 NOTE — Telephone Encounter (Signed)
Call from [facility], care manager with [facility] - # [phone] She is working with pt and states he is in need of chronic pain management.   She spoke with people at [facility] ( pt seen there in 2013)  (# [phone]) and they would see pt again if we would do a referral for back and leg pain.   Please advise

## 2012-08-30 ENCOUNTER — Other Ambulatory Visit: Payer: Self-pay | Admitting: *Deleted

## 2012-08-30 NOTE — Telephone Encounter (Signed)
Pt called and is asking for a one time refill on percocet 10/325, until he can get into pain clinic. I read your note but will be sure you will not fill before I call pt and deny request. Pt # [phone]

## 2012-08-30 NOTE — Telephone Encounter (Signed)
Received call from [facility] - wanting to know if referral was ordered by Dr Manson Passey; informed her it has been and I will forward the referral to Lela, Dr Theora Gianotti nurse.

## 2012-08-30 NOTE — Telephone Encounter (Signed)
Pt informed and voices understanding 

## 2012-08-30 NOTE — Telephone Encounter (Signed)
I'd be happy to re-refer to a pain clinic, and I'm very grateful to [facility] that they are willing to see him again.  I've placed the referral as requested.  For documentation purposes, the patient has a history of chronic pain, mostly related to severe PVD.  The patient previously received narcotics from our clinic, but after violation of a pain contract, no longer receives them from [place].  The actual violation is somewhat vague (reportedly, the patient had an inappropriate "negative" UDS for opiates, while receiving a contract for them, though he states he had run out early of his prescription, explaining the inappropriate result), but since then the patient has had red flag behaviors of calling [facility] repeatedly, demanding narcotics.  He has been referred to 4 different pain clinics, but has typically not followed up after his first visit when he wasn't given narcotics.  This patient certainly has a reason to have chronic pain, and certainly needs chronic pain management.  I do not believe management with narcotics is indicated, given the above red flag behaviors.  Awanda Mink 08/30/2012, 9:07 AM

## 2012-08-30 NOTE — Telephone Encounter (Signed)
This patient does not receive narcotics from our office.  This medication will not be refilled.

## 2012-09-13 ENCOUNTER — Other Ambulatory Visit: Payer: Self-pay | Admitting: Internal Medicine

## 2012-09-19 ENCOUNTER — Telehealth: Payer: Self-pay | Admitting: *Deleted

## 2012-09-19 NOTE — Telephone Encounter (Signed)
Pt called wanted refill on Percocet - talked with Dr Manson Passey 08/25/12 - pt violated  Pain contract with clinic - denied. Suggest pt to make appt with Dr Manson Passey  Has appt at Pain clinic 10/14/12. Pt decided not to make appt at this time. Stanton Kidney Kenzley Ke RN 09/19/12 2:45PM

## 2012-09-26 ENCOUNTER — Ambulatory Visit (INDEPENDENT_AMBULATORY_CARE_PROVIDER_SITE_OTHER): Payer: Medicaid Other | Admitting: Internal Medicine

## 2012-09-26 ENCOUNTER — Encounter: Payer: Self-pay | Admitting: Internal Medicine

## 2012-09-26 VITALS — BP 109/62 | HR 68 | Temp 97.0°F | Ht 73.0 in | Wt 164.1 lb

## 2012-09-26 DIAGNOSIS — Z720 Tobacco use: Secondary | ICD-10-CM

## 2012-09-26 DIAGNOSIS — F17200 Nicotine dependence, unspecified, uncomplicated: Secondary | ICD-10-CM

## 2012-09-26 DIAGNOSIS — R110 Nausea: Secondary | ICD-10-CM

## 2012-09-26 DIAGNOSIS — G8929 Other chronic pain: Secondary | ICD-10-CM

## 2012-09-26 DIAGNOSIS — W57XXXA Bitten or stung by nonvenomous insect and other nonvenomous arthropods, initial encounter: Secondary | ICD-10-CM

## 2012-09-26 DIAGNOSIS — M79609 Pain in unspecified limb: Secondary | ICD-10-CM

## 2012-09-26 DIAGNOSIS — T148 Other injury of unspecified body region: Secondary | ICD-10-CM

## 2012-09-26 DIAGNOSIS — E119 Type 2 diabetes mellitus without complications: Secondary | ICD-10-CM

## 2012-09-26 DIAGNOSIS — I10 Essential (primary) hypertension: Secondary | ICD-10-CM

## 2012-09-26 DIAGNOSIS — Z0000 Encounter for general adult medical examination without abnormal findings: Secondary | ICD-10-CM

## 2012-09-26 MED ORDER — OXYCODONE-ACETAMINOPHEN 10-325 MG PO TABS: 1.0000 | ORAL_TABLET | Freq: Two times a day (BID) | ORAL | Status: DC | PRN

## 2012-09-26 MED ORDER — ONDANSETRON HCL 4 MG PO TABS: 4.0000 mg | ORAL_TABLET | Freq: Three times a day (TID) | ORAL | Status: DC | PRN

## 2012-09-26 MED ORDER — DOXYCYCLINE HYCLATE 100 MG PO CAPS: 100.0000 mg | ORAL_CAPSULE | Freq: Two times a day (BID) | ORAL | Status: DC

## 2012-09-26 NOTE — Patient Instructions (Addendum)
Return to clinic in 3-6 months to see Dr. Manson Passey, sooner if needed.  Take medications as prescribed.   Follow up with the pain clinic on 10/14/12 as we will no longer give you narcotic (strong) pain medication   Nausea, Adult Nausea is the feeling that you have an upset stomach or have to vomit. Nausea by itself is not likely a serious concern, but it may be an early sign of more serious medical problems. As nausea gets worse, it can lead to vomiting. If vomiting develops, there is the risk of dehydration.  CAUSES   Viral infections.  Food poisoning.  Medicines.  Pregnancy.  Motion sickness.  Migraine headaches.  Emotional distress.  Severe pain from any source.  Alcohol intoxication. HOME CARE INSTRUCTIONS  Get plenty of rest.  Ask your caregiver about specific rehydration instructions.  Eat small amounts of food and sip liquids more often.  Take all medicines as told by your caregiver. SEEK MEDICAL CARE IF:  You have not improved after 2 days, or you get worse.  You have a headache. SEEK IMMEDIATE MEDICAL CARE IF:   You have a fever.  You faint.  You keep vomiting or have blood in your vomit.  You are extremely weak or dehydrated.  You have dark or bloody stools.  You have severe chest or abdominal pain. MAKE SURE YOU:  Understand these instructions.  Will watch your condition.  Will get help right away if you are not doing well or get worse. Document Released: 02/06/2004 Document Revised: 09/23/2011 Document Reviewed: 09/10/2010 [facility] Patient Information 2014 [place], [place].  Smoking Cessation Quitting smoking is important to your health and has many advantages. However, it is not always easy to quit since nicotine is a very addictive drug. Often times, people try 3 times or more before being able to quit. This document explains the best ways for you to prepare to quit smoking. Quitting takes hard work and a lot of effort, but you can do  it. ADVANTAGES OF QUITTING SMOKING  You will live longer, feel better, and live better.  Your body will feel the impact of quitting smoking almost immediately.  Within 20 minutes, blood pressure decreases. Your pulse returns to its normal level.  After 8 hours, carbon monoxide levels in the blood return to normal. Your oxygen level increases.  After 24 hours, the chance of having a heart attack starts to decrease. Your breath, hair, and body stop smelling like smoke.  After 48 hours, damaged nerve endings begin to recover. Your sense of taste and smell improve.  After 72 hours, the body is virtually free of nicotine. Your bronchial tubes relax and breathing becomes easier.  After 2 to 12 weeks, lungs can hold more air. Exercise becomes easier and circulation improves.  The risk of having a heart attack, stroke, cancer, or lung disease is greatly reduced.  After 1 year, the risk of coronary heart disease is cut in half.  After 5 years, the risk of stroke falls to the same as a nonsmoker.  After 10 years, the risk of lung cancer is cut in half and the risk of other cancers decreases significantly.  After 15 years, the risk of coronary heart disease drops, usually to the level of a nonsmoker.  If you are pregnant, quitting smoking will improve your chances of having a healthy baby.  The people you live with, especially any children, will be healthier.  You will have extra money to spend on things other than cigarettes.  QUESTIONS TO THINK ABOUT BEFORE ATTEMPTING TO QUIT You may want to talk about your answers with your caregiver.  Why do you want to quit?  If you tried to quit in the past, what helped and what did not?  What will be the most difficult situations for you after you quit? How will you plan to handle them?  Who can help you through the tough times? Your family? Friends? A caregiver?  What pleasures do you get from smoking? What ways can you still get pleasure if  you quit? Here are some questions to ask your caregiver:  How can you help me to be successful at quitting?  What medicine do you think would be best for me and how should I take it?  What should I do if I need more help?  What is smoking withdrawal like? How can I get information on withdrawal? GET READY  Set a quit date.  Change your environment by getting rid of all cigarettes, ashtrays, matches, and lighters in your home, car, or work. Do not let people smoke in your home.  Review your past attempts to quit. Think about what worked and what did not. GET SUPPORT AND ENCOURAGEMENT You have a better chance of being successful if you have help. You can get support in many ways.  Tell your family, friends, and co-workers that you are going to quit and need their support. Ask them not to smoke around you.  Get individual, group, or telephone counseling and support. Programs are available at [employer] and health centers. Call your local health department for information about programs in your area.  Spiritual beliefs and practices may help some smokers quit.  Download a "quit meter" on your computer to keep track of quit statistics, such as how long you have gone without smoking, cigarettes not smoked, and money saved.  Get a self-help book about quitting smoking and staying off of tobacco. LEARN NEW SKILLS AND BEHAVIORS  Distract yourself from urges to smoke. Talk to someone, go for a walk, or occupy your time with a task.  Change your normal routine. Take a different route to work. Drink tea instead of coffee. Eat breakfast in a different place.  Reduce your stress. Take a hot bath, exercise, or read a book.  Plan something enjoyable to do every day. Reward yourself for not smoking.  Explore interactive web-based programs that specialize in helping you quit. GET MEDICINE AND USE IT CORRECTLY Medicines can help you stop smoking and decrease the urge to smoke. Combining  medicine with the above behavioral methods and support can greatly increase your chances of successfully quitting smoking.  Nicotine replacement therapy helps deliver nicotine to your body without the negative effects and risks of smoking. Nicotine replacement therapy includes nicotine gum, lozenges, inhalers, nasal sprays, and skin patches. Some may be available over-the-counter and others require a prescription.  Antidepressant medicine helps people abstain from smoking, but how this works is unknown. This medicine is available by prescription.  Nicotinic receptor partial agonist medicine simulates the effect of nicotine in your brain. This medicine is available by prescription. Ask your caregiver for advice about which medicines to use and how to use them based on your health history. Your caregiver will tell you what side effects to look out for if you choose to be on a medicine or therapy. Carefully read the information on the package. Do not use any other product containing nicotine while using a nicotine replacement product.  RELAPSE  OR DIFFICULT SITUATIONS Most relapses occur within the first 3 months after quitting. Do not be discouraged if you start smoking again. Remember, most people try several times before finally quitting. You may have symptoms of withdrawal because your body is used to nicotine. You may crave cigarettes, be irritable, feel very hungry, cough often, get headaches, or have difficulty concentrating. The withdrawal symptoms are only temporary. They are strongest when you first quit, but they will go away within 10 14 days. To reduce the chances of relapse, try to:  Avoid drinking alcohol. Drinking lowers your chances of successfully quitting.  Reduce the amount of caffeine you consume. Once you quit smoking, the amount of caffeine in your body increases and can give you symptoms, such as a rapid heartbeat, sweating, and anxiety.  Avoid smokers because they can make you  want to smoke.  Do not let weight gain distract you. Many smokers will gain weight when they quit, usually less than 10 pounds. Eat a healthy diet and stay active. You can always lose the weight gained after you quit.  Find ways to improve your mood other than smoking. FOR MORE INFORMATION  www.smokefree.gov  Document Released: 12/23/2000 Document Revised: 06/30/2011 Document Reviewed: 04/09/2011 [facility] Patient Information 2014 [place], [place].  Type 2 Diabetes Mellitus, Adult Type 2 diabetes mellitus, often simply referred to as type 2 diabetes, is a long-lasting (chronic) disease. In type 2 diabetes, the pancreas does not make enough insulin (a hormone), the cells are less responsive to the insulin that is made (insulin resistance), or both. Normally, insulin moves sugars from food into the tissue cells. The tissue cells use the sugars for energy. The lack of insulin or the lack of normal response to insulin causes excess sugars to build up in the blood instead of going into the tissue cells. As a result, high blood sugar (hyperglycemia) develops. The effect of high sugar (glucose) levels can cause many complications. Type 2 diabetes was also previously called adult-onset diabetes but it can occur at any age.  RISK FACTORS  A person is predisposed to developing type 2 diabetes if someone in the family has the disease and also has one or more of the following primary risk factors:  Overweight.  An inactive lifestyle.  A history of consistently eating high-calorie foods. Maintaining a normal weight and regular physical activity can reduce the chance of developing type 2 diabetes. SYMPTOMS  A person with type 2 diabetes may not show symptoms initially. The symptoms of type 2 diabetes appear slowly. The symptoms include:  Increased thirst (polydipsia).  Increased urination (polyuria).  Increased urination during the night (nocturia).  Weight loss. This weight loss may be  rapid.  Frequent, recurring infections.  Tiredness (fatigue).  Weakness.  Vision changes, such as blurred vision.  Fruity smell to your breath.  Abdominal pain.  Nausea or vomiting.  Cuts or bruises which are slow to heal.  Tingling or numbness in the hands or feet. DIAGNOSIS Type 2 diabetes is frequently not diagnosed until complications of diabetes are present. Type 2 diabetes is diagnosed when symptoms or complications are present and when blood glucose levels are increased. Your blood glucose level may be checked by one or more of the following blood tests:  A fasting blood glucose test. You will not be allowed to eat for at least 8 hours before a blood sample is taken.  A random blood glucose test. Your blood glucose is checked at any time of the day regardless of when  you ate.  A hemoglobin A1c blood glucose test. A hemoglobin A1c test provides information about blood glucose control over the previous 3 months.  An oral glucose tolerance test (OGTT). Your blood glucose is measured after you have not eaten (fasted) for 2 hours and then after you drink a glucose-containing beverage. TREATMENT   You may need to take insulin or diabetes medicine daily to keep blood glucose levels in the desired range.  You will need to match insulin dosing with exercise and healthy food choices. The treatment goal is to maintain the before meal blood sugar (preprandial glucose) level at 70 130 mg/dL. HOME CARE INSTRUCTIONS   Have your hemoglobin A1c level checked twice a year.  Perform daily blood glucose monitoring as directed by your caregiver.  Monitor urine ketones when you are ill and as directed by your caregiver.  Take your diabetes medicine or insulin as directed by your caregiver to maintain your blood glucose levels in the desired range.  Never run out of diabetes medicine or insulin. It is needed every day.  Adjust insulin based on your intake of carbohydrates.  Carbohydrates can raise blood glucose levels but need to be included in your diet. Carbohydrates provide vitamins, minerals, and fiber which are an essential part of a healthy diet. Carbohydrates are found in fruits, vegetables, whole grains, dairy products, legumes, and foods containing added sugars.    Eat healthy foods. Alternate 3 meals with 3 snacks.  Lose weight if overweight.  Carry a medical alert card or wear your medical alert jewelry.  Carry a 15 gram carbohydrate snack with you at all times to treat low blood glucose (hypoglycemia). Some examples of 15 gram carbohydrate snacks include:  Glucose tablets, 3 or 4   Glucose gel, 15 gram tube  Raisins, 2 tablespoons (24 grams)  Jelly beans, 6  Animal crackers, 8  Regular pop, 4 ounces (120 mL)  Gummy treats, 9  Recognize hypoglycemia. Hypoglycemia occurs with blood glucose levels of 70 mg/dL and below. The risk for hypoglycemia increases when fasting or skipping meals, during or after intense exercise, and during sleep. Hypoglycemia symptoms can include:  Tremors or shakes.  Decreased ability to concentrate.  Sweating.  Increased heart rate.  Headache.  Dry mouth.  Hunger.  Irritability.  Anxiety.  Restless sleep.  Altered speech or coordination.  Confusion.  Treat hypoglycemia promptly. If you are alert and able to safely swallow, follow the 15:15 rule:  Take 15 20 grams of rapid-acting glucose or carbohydrate. Rapid-acting options include glucose gel, glucose tablets, or 4 ounces (120 mL) of fruit juice, regular soda, or low fat milk.  Check your blood glucose level 15 minutes after taking the glucose.  Take 15 20 grams more of glucose if the repeat blood glucose level is still 70 mg/dL or below.  Eat a meal or snack within 1 hour once blood glucose levels return to normal.    Be alert to polyuria and polydipsia which are early signs of hyperglycemia. An early awareness of hyperglycemia  allows for prompt treatment. Treat hyperglycemia as directed by your caregiver.  Engage in at least 150 minutes of moderate-intensity physical activity a week, spread over at least 3 days of the week or as directed by your caregiver. In addition, you should engage in resistance exercise at least 2 times a week or as directed by your caregiver.  Adjust your medicine and food intake as needed if you start a new exercise or sport.  Follow your sick day  plan at any time you are unable to eat or drink as usual.  Avoid tobacco use.  Limit alcohol intake to no more than 1 drink per day for nonpregnant women and 2 drinks per day for men. You should drink alcohol only when you are also eating food. Talk with your caregiver whether alcohol is safe for you. Tell your caregiver if you drink alcohol several times a week.  Follow up with your caregiver regularly.  Schedule an eye exam soon after the diagnosis of type 2 diabetes and then annually.  Perform daily skin and foot care. Examine your skin and feet daily for cuts, bruises, redness, nail problems, bleeding, blisters, or sores. A foot exam by a caregiver should be done annually.  Brush your teeth and gums at least twice a day and floss at least once a day. Follow up with your dentist regularly.  Share your diabetes management plan with your workplace or school.  Stay up-to-date with immunizations.  Learn to manage stress.  Obtain ongoing diabetes education and support as needed.  Participate in, or seek rehabilitation as needed to maintain or improve independence and quality of life. Request a physical or occupational therapy referral if you are having foot or hand numbness or difficulties with grooming, dressing, eating, or physical activity. SEEK MEDICAL CARE IF:   You are unable to eat food or drink fluids for more than 6 hours.  You have nausea and vomiting for more than 6 hours.  Your blood glucose level is over 240 mg/dL.  There  is a change in mental status.  You develop an additional serious illness.  You have diarrhea for more than 6 hours.  You have been sick or have had a fever for a couple of days and are not getting better.  You have pain during any physical activity.  SEEK IMMEDIATE MEDICAL CARE IF:  You have difficulty breathing.  You have moderate to large ketone levels. MAKE SURE YOU:  Understand these instructions.  Will watch your condition.  Will get help right away if you are not doing well or get worse. Document Released: 12/29/2004 Document Revised: 09/23/2011 Document Reviewed: 07/28/2011 [facility] Patient Information 2014 [place], [place].  Insect Bite Mosquitoes, flies, fleas, bedbugs, and many other insects can bite. Insect bites are different from insect stings. A sting is when venom is injected into the skin. Some insect bites can transmit infectious diseases. SYMPTOMS  Insect bites usually turn red, swell, and itch for 2 to 4 days. They often go away on their own. TREATMENT  Your caregiver may prescribe antibiotic medicines if a bacterial infection develops in the bite. HOME CARE INSTRUCTIONS  Do not scratch the bite area.  Keep the bite area clean and dry. Wash the bite area thoroughly with soap and water.  Put ice or cool compresses on the bite area.  Put ice in a plastic bag.  Place a towel between your skin and the bag.  Leave the ice on for 20 minutes, 4 times a day for the first 2 to 3 days, or as directed.  You may apply a baking soda paste, cortisone cream, or calamine lotion to the bite area as directed by your caregiver. This can help reduce itching and swelling.  Only take over-the-counter or prescription medicines as directed by your caregiver.  If you are given antibiotics, take them as directed. Finish them even if you start to feel better. You may need a tetanus shot if:  You cannot remember when you  had your last tetanus shot.  You have never had a  tetanus shot.  The injury broke your skin. If you get a tetanus shot, your arm may swell, get red, and feel warm to the touch. This is common and not a problem. If you need a tetanus shot and you choose not to have one, there is a rare chance of getting tetanus. Sickness from tetanus can be serious. SEEK IMMEDIATE MEDICAL CARE IF:   You have increased pain, redness, or swelling in the bite area.  You see a red line on the skin coming from the bite.  You have a fever.  You have joint pain.  You have a headache or neck pain.  You have unusual weakness.  You have a rash.  You have chest pain or shortness of breath.  You have abdominal pain, nausea, or vomiting.  You feel unusually tired or sleepy. MAKE SURE YOU:   Understand these instructions.  Will watch your condition.  Will get help right away if you are not doing well or get worse. Document Released: 02/06/2004 Document Revised: 03/23/2011 Document Reviewed: 07/30/2010 [facility] Patient Information 2014 [place], [place].

## 2012-09-26 NOTE — Assessment & Plan Note (Signed)
Rx Zofran q8 prn #20

## 2012-09-26 NOTE — Assessment & Plan Note (Signed)
BP Readings from Last 3 Encounters:  09/26/12 109/62  08/03/12 113/62  07/22/12 111/66    Lab Results  Component Value Date   NA 138 08/03/2012   K 4.1 08/03/2012   CREATININE 0.83 08/03/2012    Assessment: Blood pressure control: controlled Progress toward BP goal:  at goal Comments: n/a  Plan: Medications:  continue current medications (Atenolol 50 mg qd and Lisinopril 5 mg qd) Educational resources provided: brochure Self management tools provided: other (see comments) Other plans: none

## 2012-09-26 NOTE — Assessment & Plan Note (Signed)
  Assessment: Progress toward smoking cessation:  smoking less (down from 3ppd to 1 ppd ) Barriers to progress toward smoking cessation:  lack of motivation to quit Comments: pt states he will probably be smoking until he dies  Plan: Instruction/counseling given:  I counseled patient on the dangers of tobacco use, advised patient to stop smoking, and reviewed strategies to maximize success. Educational resources provided:  other (see comments) Self management tools provided:  other (see comments) Medications to assist with smoking cessation:  None Patient agreed to the following self-care plans for smoking cessation: go to the [employer] ([email or web address])  Other plans: continue smoking cessation counseling

## 2012-09-26 NOTE — Assessment & Plan Note (Signed)
Possible insect bite to left lower ab with h/o MRSA concerned for also MRSA potential  Rx Doxy 100 mg bid #14 no refills  Rx Zofran q8 prn #20

## 2012-09-26 NOTE — Assessment & Plan Note (Signed)
Given flu shot  today 

## 2012-09-26 NOTE — Assessment & Plan Note (Addendum)
Lab Results  Component Value Date   HGBA1C 5.2 09/26/2012   HGBA1C 5.4 03/03/2012   HGBA1C 5.6 11/04/2011     Assessment: Diabetes control: good control (HgbA1C at goal) Progress toward A1C goal:  at goal Comments: well controlled HA1C 5.4 today with cbg 100   Plan: Medications:  continue current medications (Metformin 500 mg qd) Home glucose monitoring: Frequency: no home glucose monitoring Timing: N/A Instruction/counseling given: no instruction/counseling  Educational resources provided: brochure Self management tools provided: other (see comments) Other plans: To discuss with PCP whether to stop Metformin in the future. HA1C and cbg checked today.  Needs to see an eye MD in the future .

## 2012-09-26 NOTE — Assessment & Plan Note (Signed)
Pt to follow up with pain clinic 10/14/12. Given one time only Percocet 10-325 mg bid prn #36 to hold the pt over until he goes to the pain clinic.  He understands this is only a one time ordeal.
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
## 2012-09-27 DIAGNOSIS — Z23 Encounter for immunization: Secondary | ICD-10-CM

## 2012-09-27 NOTE — Progress Notes (Signed)
Case discussed with Dr. McLean soon after the resident saw the patient.  We reviewed the resident's history and exam and pertinent patient test results.  I agree with the assessment, diagnosis, and plan of care documented in the resident's note. 

## 2012-09-27 NOTE — Addendum Note (Signed)
Addended by: Merrie Roof A on: 09/27/2012 12:44 PM   Modules accepted: Orders

## 2012-09-28 ENCOUNTER — Telehealth: Payer: Self-pay | Admitting: *Deleted

## 2012-09-28 NOTE — Telephone Encounter (Signed)
Pt calls and states that dr brown is not taking care of him like he should, he would like dr Shirlee Latch, dr Aundria Rud or another male physician

## 2012-10-11 ENCOUNTER — Other Ambulatory Visit: Payer: Self-pay | Admitting: Internal Medicine

## 2012-10-11 ENCOUNTER — Telehealth: Payer: Self-pay | Admitting: *Deleted

## 2012-10-11 NOTE — Telephone Encounter (Signed)
Return call to pt - pt requested name of PCP. Left message on ID phone recording Dr Luther Redo. Stanton Kidney Alante Weimann RN 10/11/12 11:20AM

## 2012-12-01 ENCOUNTER — Other Ambulatory Visit: Payer: Self-pay | Admitting: *Deleted

## 2012-12-01 DIAGNOSIS — K219 Gastro-esophageal reflux disease without esophagitis: Secondary | ICD-10-CM

## 2012-12-02 MED ORDER — RANITIDINE HCL 150 MG PO TABS: 150.0000 mg | ORAL_TABLET | Freq: Two times a day (BID) | ORAL | Status: DC | PRN

## 2012-12-02 MED ORDER — HYDROXYZINE HCL 25 MG PO TABS: 25.0000 mg | ORAL_TABLET | Freq: Two times a day (BID) | ORAL | Status: DC | PRN

## 2012-12-05 NOTE — Telephone Encounter (Signed)
Rx called in 

## 2012-12-28 ENCOUNTER — Other Ambulatory Visit: Payer: Self-pay | Admitting: Internal Medicine

## 2012-12-29 ENCOUNTER — Other Ambulatory Visit: Payer: Self-pay | Admitting: Internal Medicine

## 2013-01-02 ENCOUNTER — Other Ambulatory Visit: Payer: Self-pay | Admitting: Internal Medicine

## 2013-01-05 ENCOUNTER — Emergency Department (HOSPITAL_COMMUNITY)
Admission: EM | Admit: 2013-01-05 | Discharge: 2013-01-05 | Disposition: A | Discharge status: Home / Self Care | Payer: Medicaid Other | Attending: Emergency Medicine | Admitting: Emergency Medicine

## 2013-01-05 ENCOUNTER — Encounter (HOSPITAL_COMMUNITY): Payer: Self-pay | Admitting: Emergency Medicine

## 2013-01-05 DIAGNOSIS — Z8614 Personal history of Methicillin resistant Staphylococcus aureus infection: Secondary | ICD-10-CM | POA: Insufficient documentation

## 2013-01-05 DIAGNOSIS — IMO0002 Reserved for concepts with insufficient information to code with codable children: Secondary | ICD-10-CM | POA: Insufficient documentation

## 2013-01-05 DIAGNOSIS — Z7982 Long term (current) use of aspirin: Secondary | ICD-10-CM | POA: Insufficient documentation

## 2013-01-05 DIAGNOSIS — Z79899 Other long term (current) drug therapy: Secondary | ICD-10-CM | POA: Insufficient documentation

## 2013-01-05 DIAGNOSIS — S0993XA Unspecified injury of face, initial encounter: Secondary | ICD-10-CM | POA: Insufficient documentation

## 2013-01-05 DIAGNOSIS — G8929 Other chronic pain: Secondary | ICD-10-CM | POA: Insufficient documentation

## 2013-01-05 DIAGNOSIS — K219 Gastro-esophageal reflux disease without esophagitis: Secondary | ICD-10-CM | POA: Insufficient documentation

## 2013-01-05 DIAGNOSIS — Z8619 Personal history of other infectious and parasitic diseases: Secondary | ICD-10-CM | POA: Insufficient documentation

## 2013-01-05 DIAGNOSIS — Z8673 Personal history of transient ischemic attack (TIA), and cerebral infarction without residual deficits: Secondary | ICD-10-CM | POA: Insufficient documentation

## 2013-01-05 DIAGNOSIS — E785 Hyperlipidemia, unspecified: Secondary | ICD-10-CM | POA: Insufficient documentation

## 2013-01-05 DIAGNOSIS — Z7902 Long term (current) use of antithrombotics/antiplatelets: Secondary | ICD-10-CM | POA: Insufficient documentation

## 2013-01-05 DIAGNOSIS — E119 Type 2 diabetes mellitus without complications: Secondary | ICD-10-CM | POA: Insufficient documentation

## 2013-01-05 DIAGNOSIS — M546 Pain in thoracic spine: Secondary | ICD-10-CM

## 2013-01-05 DIAGNOSIS — Z8611 Personal history of tuberculosis: Secondary | ICD-10-CM | POA: Insufficient documentation

## 2013-01-05 DIAGNOSIS — F17200 Nicotine dependence, unspecified, uncomplicated: Secondary | ICD-10-CM | POA: Insufficient documentation

## 2013-01-05 DIAGNOSIS — M545 Low back pain: Secondary | ICD-10-CM

## 2013-01-05 DIAGNOSIS — I10 Essential (primary) hypertension: Secondary | ICD-10-CM | POA: Insufficient documentation

## 2013-01-05 DIAGNOSIS — Y92410 Unspecified street and highway as the place of occurrence of the external cause: Secondary | ICD-10-CM | POA: Insufficient documentation

## 2013-01-05 DIAGNOSIS — J4489 Other specified chronic obstructive pulmonary disease: Secondary | ICD-10-CM | POA: Insufficient documentation

## 2013-01-05 DIAGNOSIS — Y9389 Activity, other specified: Secondary | ICD-10-CM | POA: Insufficient documentation

## 2013-01-05 DIAGNOSIS — Z87448 Personal history of other diseases of urinary system: Secondary | ICD-10-CM | POA: Insufficient documentation

## 2013-01-05 DIAGNOSIS — J449 Chronic obstructive pulmonary disease, unspecified: Secondary | ICD-10-CM | POA: Insufficient documentation

## 2013-01-05 MED ORDER — OXYCODONE-ACETAMINOPHEN 5-325 MG PO TABS: 1.0000 | ORAL_TABLET | Freq: Four times a day (QID) | ORAL | Status: DC | PRN

## 2013-01-05 MED ORDER — OXYCODONE-ACETAMINOPHEN 5-325 MG PO TABS
1.0000 | ORAL_TABLET | Freq: Once | ORAL | Status: AC
  Administered 2013-01-05: 1 via ORAL
  Filled 2013-01-05: qty 1
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
## 2013-01-06 NOTE — ED Provider Notes (Signed)
Medical screening examination/treatment/procedure(s) were performed by non-physician practitioner and as supervising physician I was immediately available for consultation/collaboration.  EKG Interpretation   None        Courtney F Horton, MD 01/06/13 1510 

## 2013-01-10 ENCOUNTER — Telehealth: Payer: Self-pay | Admitting: *Deleted
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
## 2013-01-10 NOTE — Telephone Encounter (Signed)
I attempted to return the patient's call, but his listed home phone number had been disconnected, and his cell number went straight to voicemail.  This is a patient of mine, with chronic leg pain due to PVD.  He previously received narcotics from our clinic, but this was stopped a couple of years ago due to violating a pain contract.  He has since seen 4 different pain clinics, and I believe he currently has a contract with one of these clinics.  If he has had his prescription lost or stolen, he should call his pain clinic for further instructions.  Since this patient has been managed without narcotics for about 1-2 years, I'm not sure that inpatient admission for narcotics is warranted.  I'd be happy to have the patient seen in our clinic later this week or next week.

## 2013-01-12 HISTORY — PX: IR GASTROSTOMY TUBE MOD SED: IMG625

## 2013-02-01 ENCOUNTER — Other Ambulatory Visit: Payer: Self-pay | Admitting: Internal Medicine

## 2013-02-23 ENCOUNTER — Other Ambulatory Visit (HOSPITAL_COMMUNITY): Payer: Self-pay | Admitting: Interventional Radiology

## 2013-02-23 ENCOUNTER — Other Ambulatory Visit: Payer: Self-pay | Admitting: Internal Medicine

## 2013-02-23 ENCOUNTER — Telehealth (HOSPITAL_COMMUNITY): Payer: Self-pay | Admitting: Interventional Radiology

## 2013-02-23 DIAGNOSIS — I771 Stricture of artery: Secondary | ICD-10-CM

## 2013-02-23 DIAGNOSIS — R42 Dizziness and giddiness: Secondary | ICD-10-CM

## 2013-02-23 NOTE — Telephone Encounter (Signed)
Called pt left VM for him to call to schedule f/u angio JM

## 2013-03-01 ENCOUNTER — Other Ambulatory Visit: Payer: Self-pay | Admitting: Internal Medicine

## 2013-03-24 ENCOUNTER — Other Ambulatory Visit: Payer: Self-pay | Admitting: Internal Medicine

## 2013-03-29 ENCOUNTER — Other Ambulatory Visit: Payer: Self-pay | Admitting: Internal Medicine

## 2013-04-20 ENCOUNTER — Other Ambulatory Visit: Payer: Self-pay

## 2013-04-26 ENCOUNTER — Other Ambulatory Visit: Payer: Self-pay | Admitting: Internal Medicine

## 2013-05-08 ENCOUNTER — Encounter: Payer: Self-pay | Admitting: Family

## 2013-05-09 ENCOUNTER — Other Ambulatory Visit (HOSPITAL_COMMUNITY): Payer: Medicaid Other

## 2013-05-09 ENCOUNTER — Encounter (HOSPITAL_COMMUNITY): Payer: Medicaid Other

## 2013-05-09 ENCOUNTER — Ambulatory Visit: Payer: Medicaid Other | Admitting: Family

## 2013-05-11 ENCOUNTER — Encounter (HOSPITAL_COMMUNITY): Payer: Self-pay | Admitting: Pharmacy Technician

## 2013-05-11 ENCOUNTER — Other Ambulatory Visit (HOSPITAL_COMMUNITY): Payer: Self-pay | Admitting: Interventional Radiology

## 2013-05-11 DIAGNOSIS — I771 Stricture of artery: Secondary | ICD-10-CM

## 2013-05-12 ENCOUNTER — Ambulatory Visit (HOSPITAL_COMMUNITY)
Admission: RE | Admit: 2013-05-12 | Discharge: 2013-05-12 | Disposition: A | Discharge status: Home / Self Care | Payer: Medicaid Other | Source: Ambulatory Visit | Attending: Anesthesiology | Admitting: Anesthesiology

## 2013-05-12 ENCOUNTER — Encounter (HOSPITAL_COMMUNITY)
Admission: RE | Admit: 2013-05-12 | Discharge: 2013-05-12 | Disposition: A | Discharge status: Home / Self Care | Payer: Medicaid Other | Source: Ambulatory Visit | Attending: Interventional Radiology | Admitting: Interventional Radiology

## 2013-05-12 ENCOUNTER — Encounter (HOSPITAL_COMMUNITY): Payer: Self-pay

## 2013-05-12 ENCOUNTER — Other Ambulatory Visit: Payer: Self-pay | Admitting: Radiology

## 2013-05-12 DIAGNOSIS — Z01812 Encounter for preprocedural laboratory examination: Secondary | ICD-10-CM | POA: Insufficient documentation

## 2013-05-12 DIAGNOSIS — Z01818 Encounter for other preprocedural examination: Secondary | ICD-10-CM | POA: Insufficient documentation

## 2013-05-12 DIAGNOSIS — Z01810 Encounter for preprocedural cardiovascular examination: Secondary | ICD-10-CM | POA: Insufficient documentation

## 2013-05-12 LAB — COMPREHENSIVE METABOLIC PANEL
ALBUMIN: 3.3 g/dL — AB (ref 3.5–5.2)
ALT: 73 U/L — AB (ref 0–53)
AST: 68 U/L — AB (ref 0–37)
Alkaline Phosphatase: 81 U/L (ref 39–117)
BUN: 18 mg/dL (ref 6–23)
CALCIUM: 9.4 mg/dL (ref 8.4–10.5)
CO2: 25 mEq/L (ref 19–32)
CREATININE: 0.8 mg/dL (ref 0.50–1.35)
Chloride: 103 mEq/L (ref 96–112)
GFR calc Af Amer: >90 mL/min (ref >90)
GFR calc non Af Amer: >90 mL/min (ref >90)
Glucose, Bld: 117 mg/dL — ABNORMAL HIGH (ref 70–99)
Potassium: 4.5 mEq/L (ref 3.7–5.3)
SODIUM: 137 meq/L (ref 137–147)
TOTAL PROTEIN: 8.7 g/dL — AB (ref 6.0–8.3)
Total Bilirubin: 0.5 mg/dL (ref 0.3–1.2)

## 2013-05-12 LAB — CBC WITH DIFFERENTIAL/PLATELET
BASOS ABS: 0 10*3/uL (ref 0.0–0.1)
BASOS PCT: 1 % (ref 0–1)
EOS ABS: 0.3 10*3/uL (ref 0.0–0.7)
EOS PCT: 6 % — AB (ref 0–5)
HEMATOCRIT: 39.2 % (ref 39.0–52.0)
Hemoglobin: 13.3 g/dL (ref 13.0–17.0)
Lymphocytes Relative: 36 % (ref 12–46)
Lymphs Abs: 1.8 10*3/uL (ref 0.7–4.0)
MCH: 30.4 pg (ref 26.0–34.0)
MCHC: 33.9 g/dL (ref 30.0–36.0)
MCV: 89.5 fL (ref 78.0–100.0)
MONO ABS: 0.7 10*3/uL (ref 0.1–1.0)
Monocytes Relative: 15 % — ABNORMAL HIGH (ref 3–12)
Neutro Abs: 2.2 10*3/uL (ref 1.7–7.7)
Neutrophils Relative %: 42 % — ABNORMAL LOW (ref 43–77)
Platelets: 222 10*3/uL (ref 150–400)
RBC: 4.38 MIL/uL (ref 4.22–5.81)
RDW: 13.8 % (ref 11.5–15.5)
WBC: 5.1 10*3/uL (ref 4.0–10.5)

## 2013-05-12 NOTE — Progress Notes (Signed)
Patient no longer takes diabetes medication, nor does he wear the CPAP.  Said he had sleep study done sometime in 2008....will continue to look thru the records for it.   DA

## 2013-05-12 NOTE — Progress Notes (Signed)
Lab called and stated that they were unable to locate the blue top tube for the coags.  I have placed another order to re-draw when patient arrives.   DA
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
## 2013-05-15 ENCOUNTER — Other Ambulatory Visit: Payer: Self-pay | Admitting: Radiology

## 2013-05-15 ENCOUNTER — Ambulatory Visit (HOSPITAL_COMMUNITY): Admission: RE | Admit: 2013-05-15 | Payer: No Typology Code available for payment source | Source: Ambulatory Visit

## 2013-05-16 ENCOUNTER — Other Ambulatory Visit: Payer: Self-pay | Admitting: Radiology

## 2013-05-16 NOTE — Progress Notes (Signed)
Called pt to confirm surgery time and time of arrival of 6:30 AM tomorrow. Pt states he may be about 10 minutes late. I stressed to him that it's important to get here on time.

## 2013-05-17 ENCOUNTER — Encounter (HOSPITAL_COMMUNITY): Payer: Self-pay

## 2013-05-17 ENCOUNTER — Encounter (HOSPITAL_COMMUNITY): Payer: Self-pay | Admitting: *Deleted

## 2013-05-17 ENCOUNTER — Ambulatory Visit (HOSPITAL_COMMUNITY)
Admission: RE | Admit: 2013-05-17 | Discharge: 2013-05-17 | Disposition: A | Discharge status: Home / Self Care | Payer: Medicaid Other | Source: Ambulatory Visit | Attending: Interventional Radiology | Admitting: Interventional Radiology

## 2013-05-17 ENCOUNTER — Ambulatory Visit (HOSPITAL_COMMUNITY): Payer: Medicaid Other | Admitting: Anesthesiology

## 2013-05-17 ENCOUNTER — Encounter (HOSPITAL_COMMUNITY): Payer: Medicaid Other | Admitting: Vascular Surgery

## 2013-05-17 ENCOUNTER — Encounter (HOSPITAL_COMMUNITY)
Admission: RE | Disposition: A | Discharge status: Home / Self Care | Payer: Self-pay | Source: Ambulatory Visit | Attending: Interventional Radiology

## 2013-05-17 VITALS — BP 120/59 | HR 73 | Temp 97.7°F | Resp 18 | Wt 148.0 lb

## 2013-05-17 DIAGNOSIS — I2510 Atherosclerotic heart disease of native coronary artery without angina pectoris: Secondary | ICD-10-CM | POA: Insufficient documentation

## 2013-05-17 DIAGNOSIS — Z96669 Presence of unspecified artificial ankle joint: Secondary | ICD-10-CM | POA: Insufficient documentation

## 2013-05-17 DIAGNOSIS — I739 Peripheral vascular disease, unspecified: Secondary | ICD-10-CM

## 2013-05-17 DIAGNOSIS — B1920 Unspecified viral hepatitis C without hepatic coma: Secondary | ICD-10-CM

## 2013-05-17 DIAGNOSIS — E785 Hyperlipidemia, unspecified: Secondary | ICD-10-CM | POA: Insufficient documentation

## 2013-05-17 DIAGNOSIS — I10 Essential (primary) hypertension: Secondary | ICD-10-CM

## 2013-05-17 DIAGNOSIS — K219 Gastro-esophageal reflux disease without esophagitis: Secondary | ICD-10-CM | POA: Insufficient documentation

## 2013-05-17 DIAGNOSIS — Z7982 Long term (current) use of aspirin: Secondary | ICD-10-CM | POA: Insufficient documentation

## 2013-05-17 DIAGNOSIS — E119 Type 2 diabetes mellitus without complications: Secondary | ICD-10-CM

## 2013-05-17 DIAGNOSIS — G894 Chronic pain syndrome: Secondary | ICD-10-CM

## 2013-05-17 DIAGNOSIS — J4489 Other specified chronic obstructive pulmonary disease: Secondary | ICD-10-CM | POA: Insufficient documentation

## 2013-05-17 DIAGNOSIS — I6529 Occlusion and stenosis of unspecified carotid artery: Secondary | ICD-10-CM | POA: Insufficient documentation

## 2013-05-17 DIAGNOSIS — I651 Occlusion and stenosis of basilar artery: Secondary | ICD-10-CM | POA: Insufficient documentation

## 2013-05-17 DIAGNOSIS — I658 Occlusion and stenosis of other precerebral arteries: Secondary | ICD-10-CM

## 2013-05-17 DIAGNOSIS — G4730 Sleep apnea, unspecified: Secondary | ICD-10-CM

## 2013-05-17 DIAGNOSIS — F17200 Nicotine dependence, unspecified, uncomplicated: Secondary | ICD-10-CM | POA: Insufficient documentation

## 2013-05-17 DIAGNOSIS — J449 Chronic obstructive pulmonary disease, unspecified: Secondary | ICD-10-CM | POA: Insufficient documentation

## 2013-05-17 DIAGNOSIS — Z8673 Personal history of transient ischemic attack (TIA), and cerebral infarction without residual deficits: Secondary | ICD-10-CM | POA: Insufficient documentation

## 2013-05-17 DIAGNOSIS — I6509 Occlusion and stenosis of unspecified vertebral artery: Secondary | ICD-10-CM | POA: Insufficient documentation

## 2013-05-17 DIAGNOSIS — I672 Cerebral atherosclerosis: Secondary | ICD-10-CM

## 2013-05-17 DIAGNOSIS — Z7902 Long term (current) use of antithrombotics/antiplatelets: Secondary | ICD-10-CM | POA: Insufficient documentation

## 2013-05-17 DIAGNOSIS — N529 Male erectile dysfunction, unspecified: Secondary | ICD-10-CM | POA: Insufficient documentation

## 2013-05-17 DIAGNOSIS — I771 Stricture of artery: Secondary | ICD-10-CM

## 2013-05-17 HISTORY — PX: RADIOLOGY WITH ANESTHESIA: SHX6223

## 2013-05-17 LAB — GLUCOSE, CAPILLARY
Glucose-Capillary: 96 mg/dL (ref 70–99)
Glucose-Capillary: 88 mg/dL (ref 70–99)

## 2013-05-17 LAB — PLATELET INHIBITION P2Y12: Platelet Function  P2Y12: 105 [PRU] — ABNORMAL LOW (ref 194–418)

## 2013-05-17 LAB — PROTIME-INR
INR: 0.95 (ref 0.00–1.49)
Prothrombin Time: 12.5 seconds (ref 11.6–15.2)

## 2013-05-17 LAB — APTT: aPTT: 34 seconds (ref 24–37)

## 2013-05-17 SURGERY — RADIOLOGY WITH ANESTHESIA
Anesthesia: General

## 2013-05-17 MED ORDER — MIDAZOLAM HCL 5 MG/5ML IJ SOLN
INTRAMUSCULAR | Status: DC | PRN
  Administered 2013-05-17: 1 mg via INTRAVENOUS

## 2013-05-17 MED ORDER — HEPARIN SODIUM (PORCINE) 1000 UNIT/ML IJ SOLN
INTRAMUSCULAR | Status: DC | PRN
  Administered 2013-05-17: 1000 [IU] via INTRAVENOUS

## 2013-05-17 MED ORDER — OXYCODONE-ACETAMINOPHEN 5-325 MG PO TABS
2.0000 | ORAL_TABLET | Freq: Once | ORAL | Status: AC
  Administered 2013-05-17: 2 via ORAL
  Filled 2013-05-17: qty 2

## 2013-05-17 MED ORDER — FENTANYL CITRATE 0.05 MG/ML IJ SOLN
INTRAMUSCULAR | Status: DC | PRN
  Administered 2013-05-17 (×2): 50 ug via INTRAVENOUS
  Administered 2013-05-17 (×2): 25 ug via INTRAVENOUS
  Administered 2013-05-17: 50 ug via INTRAVENOUS

## 2013-05-17 MED ORDER — NIMODIPINE 30 MG PO CAPS
60.0000 mg | ORAL_CAPSULE | ORAL | Status: DC
  Filled 2013-05-17: qty 2

## 2013-05-17 MED ORDER — SODIUM CHLORIDE 0.9 % IV SOLN: INTRAVENOUS | Status: DC

## 2013-05-17 MED ORDER — IOHEXOL 300 MG/ML  SOLN
150.0000 mL | Freq: Once | INTRAMUSCULAR | Status: AC | PRN
  Administered 2013-05-17: 70 mL via INTRA_ARTERIAL

## 2013-05-17 MED ORDER — SODIUM CHLORIDE 0.9 % IV SOLN
Freq: Once | INTRAVENOUS | Status: AC
  Administered 2013-05-17: 08:00:00 via INTRAVENOUS

## 2013-05-17 MED ORDER — CEFAZOLIN SODIUM-DEXTROSE 2-3 GM-% IV SOLR
2.0000 g | Freq: Once | INTRAVENOUS | Status: AC
  Administered 2013-05-17: 2 g via INTRAVENOUS

## 2013-05-17 MED ORDER — ATENOLOL 50 MG PO TABS
50.0000 mg | ORAL_TABLET | Freq: Once | ORAL | Status: AC
  Administered 2013-05-17: 50 mg via ORAL
  Filled 2013-05-17: qty 1

## 2013-05-17 NOTE — Transfer of Care (Signed)
Immediate Anesthesia Transfer of Care Note  Patient: [name]  Procedure(s) Performed: Procedure(s): STENT PLACEMENT  (N/A)  Patient Location: Short Stay  Anesthesia Type:MAC  Level of Consciousness: awake, alert , oriented and patient cooperative  Airway & Oxygen Therapy: Patient Spontanous Breathing and Patient connected to nasal cannula oxygen  Post-op Assessment: Report given to PACU RN, Post -op Vital signs reviewed and stable and Patient moving all extremities  Post vital signs: Reviewed and stable  Complications: No apparent anesthesia complications

## 2013-05-17 NOTE — Procedures (Signed)
S/P 4 vessel cerebral arteriogram. RT CFA approach. Findings. 1.Approx 63% stenosis of RT VA origin.  2.aprrox 65% stenosi of  RT ICA cavernous  seg

## 2013-05-17 NOTE — Anesthesia Preprocedure Evaluation (Addendum)
Anesthesia Evaluation  Patient identified by MRN, date of birth, ID band Patient awake    Airway       Dental   Pulmonary sleep apnea , COPDCurrent Smoker,          Cardiovascular hypertension,     Neuro/Psych    GI/Hepatic   Endo/Other  diabetes  Renal/GU      Musculoskeletal   Abdominal   Peds  Hematology   Anesthesia Other Findings   Reproductive/Obstetrics                          Anesthesia Physical Anesthesia Plan  ASA: III  Anesthesia Plan: General   Post-op Pain Management:    Induction: Intravenous  Airway Management Planned: Oral ETT  Additional Equipment: Arterial line  Intra-op Plan:   Post-operative Plan: Extubation in OR  Informed Consent: I have reviewed the patients History and Physical, chart, labs and discussed the procedure including the risks, benefits and alternatives for the proposed anesthesia with the patient or authorized representative who has indicated his/her understanding and acceptance.     Plan Discussed with:   Anesthesia Plan Comments:         Anesthesia Quick Evaluation

## 2013-05-17 NOTE — Progress Notes (Signed)
Pt's SBP below 120 (106/60), Nimotop not given. Awaiting Atenolol from pharmacy.

## 2013-05-17 NOTE — Addendum Note (Signed)
Addendum created 05/17/13 1120 by Izora Gala, CRNA   Modules edited: Charges VN

## 2013-05-17 NOTE — Discharge Instructions (Signed)
Angiography, Care After °Refer to this sheet in the next few weeks. These instructions provide you with information on caring for yourself after your procedure. Your health care provider may also give you more specific instructions. Your treatment has been planned according to current medical practices, but problems sometimes occur. Call your health care provider if you have any problems or questions after your procedure.  °WHAT TO EXPECT AFTER THE PROCEDURE °After your procedure, it is typical to have the following sensations: °· Minor discomfort or tenderness and a small bump at the catheter insertion site. The bump should usually decrease in size and tenderness within 1 to 2 weeks. °· Any bruising will usually fade within 2 to 4 weeks. °HOME CARE INSTRUCTIONS  °· You may need to keep taking blood thinners if they were prescribed for you. Only take over-the-counter or prescription medicines for pain, fever, or discomfort as directed by your health care provider. °· Do not apply powder or lotion to the site. °· Do not sit in a bathtub, swimming pool, or whirlpool for 5 to 7 days. °· You may shower 24 hours after the procedure. Remove the bandage (dressing) and gently wash the site with plain soap and water. Gently pat the site dry. °· Inspect the site at least twice daily. °· Limit your activity for the first 48 hours. Do not bend, squat, or lift anything over 20 lb (9 kg) or as directed by your health care provider. °· Do not drive home if you are discharged the day of the procedure. Have someone else drive you. Follow instructions about when you can drive or return to work. °SEEK MEDICAL CARE IF: °· You get lightheaded when standing up. °· You have drainage (other than a small amount of blood on the dressing). °· You have chills. °· You have a fever. °· You have redness, warmth, swelling, or pain at the insertion site. °SEEK IMMEDIATE MEDICAL CARE IF:  °· You develop chest pain or shortness of breath, feel faint,  or pass out. °· You have bleeding, swelling larger than a walnut, or drainage from the catheter insertion site. °· You develop pain, discoloration, coldness, or severe bruising in the leg or arm that held the catheter.. °· You have heavy bleeding from the site. If this happens, hold pressure on the site and call 911. °MAKE SURE YOU: °· Understand these instructions. °· Will watch your condition. °· Will get help right away if you are not doing well or get worse. °Document Released: 07/17/2004 Document Revised: 08/31/2012 Document Reviewed: 05/23/2012 °[employer]® Patient Information ©2014 [employer]. ° °
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
## 2013-05-17 NOTE — Discharge Instructions (Signed)
Start Metformin on Friday.RTC in 2 months

## 2013-05-17 NOTE — Anesthesia Postprocedure Evaluation (Signed)
  Anesthesia Post-op Note  Patient: [name]  Procedure(s) Performed: Procedure(s): STENT PLACEMENT  (N/A)  Patient Location: Short Stay  Anesthesia Type:MAC  Level of Consciousness: awake and alert   Airway and Oxygen Therapy: Patient Spontanous Breathing  Post-op Pain: none  Post-op Assessment: Post-op Vital signs reviewed  Post-op Vital Signs: stable  Last Vitals:  Filed Vitals:   05/17/13 1050  BP: 128/62  Pulse: 71  Temp: 36.2 C  Resp: 18    Complications: No apparent anesthesia complications

## 2013-05-17 NOTE — Progress Notes (Signed)
Pt. Refuses to complete bedrest time; therefore D/C home with wife; groin site stable, no c/o

## 2013-05-19 ENCOUNTER — Encounter (HOSPITAL_COMMUNITY): Payer: Self-pay | Admitting: Interventional Radiology

## 2013-05-24 ENCOUNTER — Other Ambulatory Visit: Payer: Self-pay | Admitting: Internal Medicine

## 2013-05-30 ENCOUNTER — Encounter (HOSPITAL_COMMUNITY): Payer: Self-pay | Admitting: Emergency Medicine

## 2013-05-30 ENCOUNTER — Emergency Department (HOSPITAL_COMMUNITY)
Admission: EM | Admit: 2013-05-30 | Discharge: 2013-05-30 | Disposition: A | Discharge status: Home / Self Care | Payer: Medicaid Other | Attending: Emergency Medicine | Admitting: Emergency Medicine

## 2013-05-30 DIAGNOSIS — Z8673 Personal history of transient ischemic attack (TIA), and cerebral infarction without residual deficits: Secondary | ICD-10-CM | POA: Insufficient documentation

## 2013-05-30 DIAGNOSIS — Z87828 Personal history of other (healed) physical injury and trauma: Secondary | ICD-10-CM | POA: Insufficient documentation

## 2013-05-30 DIAGNOSIS — Z79899 Other long term (current) drug therapy: Secondary | ICD-10-CM | POA: Insufficient documentation

## 2013-05-30 DIAGNOSIS — Z9889 Other specified postprocedural states: Secondary | ICD-10-CM | POA: Insufficient documentation

## 2013-05-30 DIAGNOSIS — I10 Essential (primary) hypertension: Secondary | ICD-10-CM | POA: Insufficient documentation

## 2013-05-30 DIAGNOSIS — Z791 Long term (current) use of non-steroidal anti-inflammatories (NSAID): Secondary | ICD-10-CM | POA: Insufficient documentation

## 2013-05-30 DIAGNOSIS — M79609 Pain in unspecified limb: Secondary | ICD-10-CM | POA: Insufficient documentation

## 2013-05-30 DIAGNOSIS — E119 Type 2 diabetes mellitus without complications: Secondary | ICD-10-CM | POA: Insufficient documentation

## 2013-05-30 DIAGNOSIS — Z7902 Long term (current) use of antithrombotics/antiplatelets: Secondary | ICD-10-CM | POA: Insufficient documentation

## 2013-05-30 DIAGNOSIS — IMO0002 Reserved for concepts with insufficient information to code with codable children: Secondary | ICD-10-CM | POA: Insufficient documentation

## 2013-05-30 DIAGNOSIS — Z96669 Presence of unspecified artificial ankle joint: Secondary | ICD-10-CM | POA: Insufficient documentation

## 2013-05-30 DIAGNOSIS — E785 Hyperlipidemia, unspecified: Secondary | ICD-10-CM | POA: Insufficient documentation

## 2013-05-30 DIAGNOSIS — M79606 Pain in leg, unspecified: Secondary | ICD-10-CM

## 2013-05-30 DIAGNOSIS — Z8614 Personal history of Methicillin resistant Staphylococcus aureus infection: Secondary | ICD-10-CM | POA: Insufficient documentation

## 2013-05-30 DIAGNOSIS — G894 Chronic pain syndrome: Secondary | ICD-10-CM | POA: Insufficient documentation

## 2013-05-30 DIAGNOSIS — Z87448 Personal history of other diseases of urinary system: Secondary | ICD-10-CM | POA: Insufficient documentation

## 2013-05-30 DIAGNOSIS — G8929 Other chronic pain: Secondary | ICD-10-CM | POA: Insufficient documentation

## 2013-05-30 DIAGNOSIS — K219 Gastro-esophageal reflux disease without esophagitis: Secondary | ICD-10-CM | POA: Insufficient documentation

## 2013-05-30 DIAGNOSIS — Z8781 Personal history of (healed) traumatic fracture: Secondary | ICD-10-CM | POA: Insufficient documentation

## 2013-05-30 DIAGNOSIS — Z8619 Personal history of other infectious and parasitic diseases: Secondary | ICD-10-CM | POA: Insufficient documentation

## 2013-05-30 DIAGNOSIS — Z8611 Personal history of tuberculosis: Secondary | ICD-10-CM | POA: Insufficient documentation

## 2013-05-30 DIAGNOSIS — F17200 Nicotine dependence, unspecified, uncomplicated: Secondary | ICD-10-CM | POA: Insufficient documentation

## 2013-05-30 DIAGNOSIS — Z760 Encounter for issue of repeat prescription: Secondary | ICD-10-CM | POA: Insufficient documentation

## 2013-05-30 DIAGNOSIS — G4730 Sleep apnea, unspecified: Secondary | ICD-10-CM | POA: Insufficient documentation

## 2013-05-30 DIAGNOSIS — Z7982 Long term (current) use of aspirin: Secondary | ICD-10-CM | POA: Insufficient documentation

## 2013-05-30 MED ORDER — NAPROXEN 500 MG PO TABS
500.0000 mg | ORAL_TABLET | Freq: Once | ORAL | Status: AC
  Administered 2013-05-30: 500 mg via ORAL
  Filled 2013-05-30: qty 1

## 2013-05-30 NOTE — ED Notes (Signed)
Pt upset/agitated that he is only being offered naproxen for pain.  Pt reports "i normally take two 10/325 percocets and that barely touches it", pt yelling that he needs to be admitted to the hospital.  EDPA Robyn notified and sts she is only offering pt naproxen.  Pt updated, pt takes naproxen and storms out of room yelling that it won't help him.

## 2013-05-30 NOTE — Discharge Instructions (Signed)
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
## 2013-05-31 NOTE — ED Provider Notes (Signed)
Medical screening examination/treatment/procedure(s) were performed by non-physician practitioner and as supervising physician I was immediately available for consultation/collaboration.     [name], MD 05/31/13 [phone]

## 2013-06-09 ENCOUNTER — Emergency Department (INDEPENDENT_AMBULATORY_CARE_PROVIDER_SITE_OTHER)
Admission: EM | Admit: 2013-06-09 | Discharge: 2013-06-09 | Disposition: A | Discharge status: Nursing Home (Includes ICF) | Payer: Medicaid Other | Source: Home / Self Care | Attending: Family Medicine | Admitting: Family Medicine

## 2013-06-09 ENCOUNTER — Emergency Department (HOSPITAL_COMMUNITY)
Admission: EM | Admit: 2013-06-09 | Discharge: 2013-06-09 | Disposition: A | Discharge status: Home / Self Care | Payer: Medicaid Other | Attending: Emergency Medicine | Admitting: Emergency Medicine

## 2013-06-09 ENCOUNTER — Encounter (HOSPITAL_COMMUNITY): Payer: Self-pay | Admitting: Emergency Medicine

## 2013-06-09 ENCOUNTER — Emergency Department (INDEPENDENT_AMBULATORY_CARE_PROVIDER_SITE_OTHER): Payer: Medicaid Other

## 2013-06-09 DIAGNOSIS — G894 Chronic pain syndrome: Secondary | ICD-10-CM | POA: Insufficient documentation

## 2013-06-09 DIAGNOSIS — L03113 Cellulitis of right upper limb: Secondary | ICD-10-CM

## 2013-06-09 DIAGNOSIS — F17200 Nicotine dependence, unspecified, uncomplicated: Secondary | ICD-10-CM | POA: Insufficient documentation

## 2013-06-09 DIAGNOSIS — K219 Gastro-esophageal reflux disease without esophagitis: Secondary | ICD-10-CM | POA: Insufficient documentation

## 2013-06-09 DIAGNOSIS — L02519 Cutaneous abscess of unspecified hand: Secondary | ICD-10-CM

## 2013-06-09 DIAGNOSIS — Z9181 History of falling: Secondary | ICD-10-CM | POA: Insufficient documentation

## 2013-06-09 DIAGNOSIS — Z7982 Long term (current) use of aspirin: Secondary | ICD-10-CM | POA: Insufficient documentation

## 2013-06-09 DIAGNOSIS — Z7901 Long term (current) use of anticoagulants: Secondary | ICD-10-CM | POA: Insufficient documentation

## 2013-06-09 DIAGNOSIS — I10 Essential (primary) hypertension: Secondary | ICD-10-CM | POA: Insufficient documentation

## 2013-06-09 DIAGNOSIS — L03114 Cellulitis of left upper limb: Secondary | ICD-10-CM

## 2013-06-09 DIAGNOSIS — Z87828 Personal history of other (healed) physical injury and trauma: Secondary | ICD-10-CM | POA: Insufficient documentation

## 2013-06-09 DIAGNOSIS — Z8614 Personal history of Methicillin resistant Staphylococcus aureus infection: Secondary | ICD-10-CM | POA: Insufficient documentation

## 2013-06-09 DIAGNOSIS — E785 Hyperlipidemia, unspecified: Secondary | ICD-10-CM | POA: Insufficient documentation

## 2013-06-09 DIAGNOSIS — Z87448 Personal history of other diseases of urinary system: Secondary | ICD-10-CM | POA: Insufficient documentation

## 2013-06-09 DIAGNOSIS — G4733 Obstructive sleep apnea (adult) (pediatric): Secondary | ICD-10-CM | POA: Insufficient documentation

## 2013-06-09 DIAGNOSIS — Z8673 Personal history of transient ischemic attack (TIA), and cerebral infarction without residual deficits: Secondary | ICD-10-CM | POA: Insufficient documentation

## 2013-06-09 DIAGNOSIS — J4489 Other specified chronic obstructive pulmonary disease: Secondary | ICD-10-CM | POA: Insufficient documentation

## 2013-06-09 DIAGNOSIS — J449 Chronic obstructive pulmonary disease, unspecified: Secondary | ICD-10-CM | POA: Insufficient documentation

## 2013-06-09 DIAGNOSIS — Z79899 Other long term (current) drug therapy: Secondary | ICD-10-CM | POA: Insufficient documentation

## 2013-06-09 DIAGNOSIS — L03119 Cellulitis of unspecified part of limb: Secondary | ICD-10-CM

## 2013-06-09 DIAGNOSIS — Z8619 Personal history of other infectious and parasitic diseases: Secondary | ICD-10-CM | POA: Insufficient documentation

## 2013-06-09 DIAGNOSIS — E119 Type 2 diabetes mellitus without complications: Secondary | ICD-10-CM | POA: Insufficient documentation

## 2013-06-09 LAB — CBC WITH DIFFERENTIAL/PLATELET
Basophils Absolute: 0 10*3/uL (ref 0.0–0.1)
Basophils Relative: 1 % (ref 0–1)
EOS PCT: 3 % (ref 0–5)
Eosinophils Absolute: 0.2 10*3/uL (ref 0.0–0.7)
HCT: 37.6 % — ABNORMAL LOW (ref 39.0–52.0)
Hemoglobin: 12.7 g/dL — ABNORMAL LOW (ref 13.0–17.0)
LYMPHS ABS: 1.9 10*3/uL (ref 0.7–4.0)
Lymphocytes Relative: 29 % (ref 12–46)
MCH: 30.2 pg (ref 26.0–34.0)
MCHC: 33.8 g/dL (ref 30.0–36.0)
MCV: 89.5 fL (ref 78.0–100.0)
Monocytes Absolute: 0.7 10*3/uL (ref 0.1–1.0)
Monocytes Relative: 11 % (ref 3–12)
Neutro Abs: 3.7 10*3/uL (ref 1.7–7.7)
Neutrophils Relative %: 56 % (ref 43–77)
Platelets: 249 10*3/uL (ref 150–400)
RBC: 4.2 MIL/uL — ABNORMAL LOW (ref 4.22–5.81)
RDW: 15 % (ref 11.5–15.5)
WBC: 6.5 10*3/uL (ref 4.0–10.5)

## 2013-06-09 LAB — POCT I-STAT, CHEM 8
BUN: 9 mg/dL (ref 6–23)
CREATININE: 0.9 mg/dL (ref 0.50–1.35)
Calcium, Ion: 1.18 mmol/L (ref 1.12–1.23)
Chloride: 100 mEq/L (ref 96–112)
Glucose, Bld: 85 mg/dL (ref 70–99)
HEMATOCRIT: 43 % (ref 39.0–52.0)
Hemoglobin: 14.6 g/dL (ref 13.0–17.0)
Potassium: 3.8 mEq/L (ref 3.7–5.3)
Sodium: 142 mEq/L (ref 137–147)
TCO2: 27 mmol/L (ref 0–100)

## 2013-06-09 MED ORDER — TETANUS-DIPHTH-ACELL PERTUSSIS 5-2.5-18.5 LF-MCG/0.5 IM SUSP
0.5000 mL | Freq: Once | INTRAMUSCULAR | Status: AC
  Administered 2013-06-09: 0.5 mL via INTRAMUSCULAR

## 2013-06-09 MED ORDER — MORPHINE SULFATE 4 MG/ML IJ SOLN
4.0000 mg | Freq: Once | INTRAMUSCULAR | Status: AC
  Administered 2013-06-09: 4 mg via INTRAVENOUS
  Filled 2013-06-09: qty 1

## 2013-06-09 MED ORDER — VANCOMYCIN HCL 10 G IV SOLR
1250.0000 mg | Freq: Once | INTRAVENOUS | Status: AC
  Administered 2013-06-09: 1250 mg via INTRAVENOUS
  Filled 2013-06-09: qty 1250

## 2013-06-09 MED ORDER — TETANUS-DIPHTH-ACELL PERTUSSIS 5-2.5-18.5 LF-MCG/0.5 IM SUSP
INTRAMUSCULAR | Status: AC
  Filled 2013-06-09: qty 0.5

## 2013-06-09 MED ORDER — OXYCODONE-ACETAMINOPHEN 5-325 MG PO TABS: 2.0000 | ORAL_TABLET | Freq: Once | ORAL | Status: DC

## 2013-06-09 MED ORDER — SULFAMETHOXAZOLE-TRIMETHOPRIM 800-160 MG PO TABS: 1.0000 | ORAL_TABLET | Freq: Two times a day (BID) | ORAL | Status: DC

## 2013-06-09 MED ORDER — OXYCODONE-ACETAMINOPHEN 5-325 MG PO TABS
1.0000 | ORAL_TABLET | Freq: Once | ORAL | Status: AC
  Administered 2013-06-09: 1 via ORAL
  Filled 2013-06-09: qty 1

## 2013-06-09 MED ORDER — OXYCODONE-ACETAMINOPHEN 5-325 MG PO TABS: 1.0000 | ORAL_TABLET | Freq: Four times a day (QID) | ORAL | Status: DC | PRN

## 2013-06-09 MED ORDER — KETOROLAC TROMETHAMINE 30 MG/ML IJ SOLN
INTRAMUSCULAR | Status: AC
  Filled 2013-06-09: qty 1

## 2013-06-09 MED ORDER — KETOROLAC TROMETHAMINE 30 MG/ML IJ SOLN
30.0000 mg | Freq: Once | INTRAMUSCULAR | Status: AC
  Administered 2013-06-09: 30 mg via INTRAMUSCULAR
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
## 2013-06-09 NOTE — ED Notes (Signed)
Pt. Left prior to entire bag of vancomycin completed, approximately 25cc left. States he could not wait because his ride was here.

## 2013-06-09 NOTE — ED Notes (Signed)
Advised PA that patient requesting pain medication.   PA advised [facility] to see patient.
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
## 2013-06-09 NOTE — ED Notes (Signed)
Pt  Reports  He  Scratched  His  l      Hand     sev  Weeks  Ago  And  He     Has   Swelling      Of the  Affected  Hand          With  Pain  Present

## 2013-06-09 NOTE — ED Notes (Addendum)
Contraindications to give percocet per pain protocol due to patient allergies.   PA aware and advised that she will be going in to see patient.

## 2013-06-09 NOTE — ED Notes (Signed)
Pt from [facility]: Pt has redness, edema and swelling to left arm after scrapping it on metal two days ago. Pt sent to be seen by Dr. Burney Gauze. Pt denies recent fever/chills. Reports 10/10 pain, given 30mg  IM Toradol at UC with no relief.
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
## 2013-06-09 NOTE — ED Notes (Signed)
Pt     Advised to  Remain npo

## 2013-06-11 ENCOUNTER — Encounter (HOSPITAL_COMMUNITY): Payer: Self-pay | Admitting: Emergency Medicine

## 2013-06-11 ENCOUNTER — Emergency Department (HOSPITAL_COMMUNITY): Payer: Medicaid Other

## 2013-06-11 ENCOUNTER — Inpatient Hospital Stay (HOSPITAL_COMMUNITY)
Admission: EM | Admit: 2013-06-11 | Discharge: 2013-06-13 | DRG: 603 | Disposition: A | Discharge status: Home / Self Care | Payer: Medicaid Other | Attending: Internal Medicine | Admitting: Internal Medicine

## 2013-06-11 DIAGNOSIS — T148XXA Other injury of unspecified body region, initial encounter: Secondary | ICD-10-CM

## 2013-06-11 DIAGNOSIS — L02519 Cutaneous abscess of unspecified hand: Principal | ICD-10-CM | POA: Diagnosis present

## 2013-06-11 DIAGNOSIS — Z79899 Other long term (current) drug therapy: Secondary | ICD-10-CM

## 2013-06-11 DIAGNOSIS — L03119 Cellulitis of unspecified part of limb: Secondary | ICD-10-CM | POA: Diagnosis present

## 2013-06-11 DIAGNOSIS — I6529 Occlusion and stenosis of unspecified carotid artery: Secondary | ICD-10-CM

## 2013-06-11 DIAGNOSIS — E785 Hyperlipidemia, unspecified: Secondary | ICD-10-CM | POA: Diagnosis present

## 2013-06-11 DIAGNOSIS — E119 Type 2 diabetes mellitus without complications: Secondary | ICD-10-CM | POA: Diagnosis present

## 2013-06-11 DIAGNOSIS — J449 Chronic obstructive pulmonary disease, unspecified: Secondary | ICD-10-CM | POA: Diagnosis present

## 2013-06-11 DIAGNOSIS — J4489 Other specified chronic obstructive pulmonary disease: Secondary | ICD-10-CM | POA: Diagnosis present

## 2013-06-11 DIAGNOSIS — G4733 Obstructive sleep apnea (adult) (pediatric): Secondary | ICD-10-CM | POA: Diagnosis present

## 2013-06-11 DIAGNOSIS — I10 Essential (primary) hypertension: Secondary | ICD-10-CM | POA: Diagnosis present

## 2013-06-11 DIAGNOSIS — F17200 Nicotine dependence, unspecified, uncomplicated: Secondary | ICD-10-CM | POA: Diagnosis present

## 2013-06-11 DIAGNOSIS — Z7902 Long term (current) use of antithrombotics/antiplatelets: Secondary | ICD-10-CM

## 2013-06-11 DIAGNOSIS — B1920 Unspecified viral hepatitis C without hepatic coma: Secondary | ICD-10-CM | POA: Diagnosis present

## 2013-06-11 DIAGNOSIS — Z8673 Personal history of transient ischemic attack (TIA), and cerebral infarction without residual deficits: Secondary | ICD-10-CM

## 2013-06-11 DIAGNOSIS — G894 Chronic pain syndrome: Secondary | ICD-10-CM | POA: Diagnosis present

## 2013-06-11 DIAGNOSIS — Z720 Tobacco use: Secondary | ICD-10-CM | POA: Diagnosis present

## 2013-06-11 DIAGNOSIS — K219 Gastro-esophageal reflux disease without esophagitis: Secondary | ICD-10-CM | POA: Diagnosis present

## 2013-06-11 DIAGNOSIS — I739 Peripheral vascular disease, unspecified: Secondary | ICD-10-CM | POA: Diagnosis present

## 2013-06-11 DIAGNOSIS — Z8614 Personal history of Methicillin resistant Staphylococcus aureus infection: Secondary | ICD-10-CM

## 2013-06-11 DIAGNOSIS — L0390 Cellulitis, unspecified: Secondary | ICD-10-CM | POA: Diagnosis present

## 2013-06-11 DIAGNOSIS — Z7982 Long term (current) use of aspirin: Secondary | ICD-10-CM

## 2013-06-11 DIAGNOSIS — I6523 Occlusion and stenosis of bilateral carotid arteries: Secondary | ICD-10-CM | POA: Diagnosis present

## 2013-06-11 DIAGNOSIS — L03114 Cellulitis of left upper limb: Secondary | ICD-10-CM

## 2013-06-11 DIAGNOSIS — F1011 Alcohol abuse, in remission: Secondary | ICD-10-CM | POA: Diagnosis present

## 2013-06-11 DIAGNOSIS — F1010 Alcohol abuse, uncomplicated: Secondary | ICD-10-CM | POA: Diagnosis present

## 2013-06-11 DIAGNOSIS — Z96669 Presence of unspecified artificial ankle joint: Secondary | ICD-10-CM

## 2013-06-11 DIAGNOSIS — M79609 Pain in unspecified limb: Secondary | ICD-10-CM | POA: Diagnosis present

## 2013-06-11 DIAGNOSIS — L089 Local infection of the skin and subcutaneous tissue, unspecified: Secondary | ICD-10-CM | POA: Diagnosis present

## 2013-06-11 DIAGNOSIS — I658 Occlusion and stenosis of other precerebral arteries: Secondary | ICD-10-CM | POA: Diagnosis present

## 2013-06-11 LAB — CBC WITH DIFFERENTIAL/PLATELET
BASOS ABS: 0 10*3/uL (ref 0.0–0.1)
Basophils Relative: 1 % (ref 0–1)
EOS PCT: 9 % — AB (ref 0–5)
Eosinophils Absolute: 0.5 10*3/uL (ref 0.0–0.7)
HEMATOCRIT: 36.2 % — AB (ref 39.0–52.0)
Hemoglobin: 12 g/dL — ABNORMAL LOW (ref 13.0–17.0)
LYMPHS ABS: 2.1 10*3/uL (ref 0.7–4.0)
LYMPHS PCT: 38 % (ref 12–46)
MCH: 29.8 pg (ref 26.0–34.0)
MCHC: 33.1 g/dL (ref 30.0–36.0)
MCV: 89.8 fL (ref 78.0–100.0)
MONO ABS: 0.5 10*3/uL (ref 0.1–1.0)
Monocytes Relative: 9 % (ref 3–12)
NEUTROS ABS: 2.4 10*3/uL (ref 1.7–7.7)
Neutrophils Relative %: 43 % (ref 43–77)
PLATELETS: 233 10*3/uL (ref 150–400)
RBC: 4.03 MIL/uL — AB (ref 4.22–5.81)
RDW: 15 % (ref 11.5–15.5)
WBC: 5.6 10*3/uL (ref 4.0–10.5)

## 2013-06-11 LAB — BASIC METABOLIC PANEL
BUN: 13 mg/dL (ref 6–23)
CHLORIDE: 103 meq/L (ref 96–112)
CO2: 23 meq/L (ref 19–32)
Calcium: 8.8 mg/dL (ref 8.4–10.5)
Creatinine, Ser: 0.92 mg/dL (ref 0.50–1.35)
GFR calc Af Amer: >90 mL/min (ref >90)
GFR calc non Af Amer: >90 mL/min (ref >90)
GLUCOSE: 139 mg/dL — AB (ref 70–99)
Potassium: 3.3 mEq/L — ABNORMAL LOW (ref 3.7–5.3)
Sodium: 139 mEq/L (ref 137–147)

## 2013-06-11 LAB — ETHANOL

## 2013-06-11 MED ORDER — MORPHINE SULFATE 4 MG/ML IJ SOLN
4.0000 mg | INTRAMUSCULAR | Status: DC | PRN
  Administered 2013-06-11 – 2013-06-12 (×2): 4 mg via INTRAVENOUS
  Filled 2013-06-11 (×2): qty 1

## 2013-06-11 MED ORDER — SODIUM CHLORIDE 0.9 % IV SOLN
Freq: Once | INTRAVENOUS | Status: AC
  Administered 2013-06-11: 22:00:00 via INTRAVENOUS

## 2013-06-11 MED ORDER — VANCOMYCIN HCL IN DEXTROSE 1-5 GM/200ML-% IV SOLN
1000.0000 mg | Freq: Once | INTRAVENOUS | Status: AC
  Administered 2013-06-11: 1000 mg via INTRAVENOUS
  Filled 2013-06-11: qty 200

## 2013-06-11 NOTE — ED Notes (Addendum)
Pt c/o pain and swelling to left hand.  Pt was suppose to return to ED yesterday to see Dr. Burney Gauze but st's he did not have a ride.  St's started taking antibiotics yesterday.  St's he is out of pain meds.
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
## 2013-06-11 NOTE — ED Notes (Signed)
Patient transported from X-ray tp c 24

## 2013-06-11 NOTE — ED Notes (Signed)
PA in room, advised pt requesting pain med.

## 2013-06-11 NOTE — ED Notes (Signed)
Report given to Paul RN.
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
## 2013-06-12 ENCOUNTER — Encounter (HOSPITAL_COMMUNITY): Payer: Self-pay | Admitting: Certified Registered Nurse Anesthetist

## 2013-06-12 ENCOUNTER — Encounter (HOSPITAL_COMMUNITY): Payer: Self-pay | Admitting: *Deleted

## 2013-06-12 ENCOUNTER — Encounter (HOSPITAL_COMMUNITY)
Admission: EM | Disposition: A | Discharge status: Home / Self Care | Payer: Self-pay | Source: Home / Self Care | Attending: Internal Medicine

## 2013-06-12 DIAGNOSIS — F17200 Nicotine dependence, unspecified, uncomplicated: Secondary | ICD-10-CM

## 2013-06-12 DIAGNOSIS — L0390 Cellulitis, unspecified: Secondary | ICD-10-CM | POA: Diagnosis present

## 2013-06-12 LAB — GLUCOSE, CAPILLARY
Glucose-Capillary: 92 mg/dL (ref 70–99)
Glucose-Capillary: 87 mg/dL (ref 70–99)
Glucose-Capillary: 84 mg/dL (ref 70–99)
Glucose-Capillary: 142 mg/dL — ABNORMAL HIGH (ref 70–99)
Glucose-Capillary: 129 mg/dL — ABNORMAL HIGH (ref 70–99)

## 2013-06-12 LAB — CBC
HCT: 34.7 % — ABNORMAL LOW (ref 39.0–52.0)
Hemoglobin: 11.2 g/dL — ABNORMAL LOW (ref 13.0–17.0)
MCH: 29.2 pg (ref 26.0–34.0)
MCHC: 32.3 g/dL (ref 30.0–36.0)
MCV: 90.4 fL (ref 78.0–100.0)
Platelets: 206 10*3/uL (ref 150–400)
RBC: 3.84 MIL/uL — ABNORMAL LOW (ref 4.22–5.81)
RDW: 15.1 % (ref 11.5–15.5)
WBC: 4.6 10*3/uL (ref 4.0–10.5)

## 2013-06-12 LAB — BASIC METABOLIC PANEL
BUN: 10 mg/dL (ref 6–23)
CO2: 22 mEq/L (ref 19–32)
Calcium: 8.5 mg/dL (ref 8.4–10.5)
Chloride: 108 mEq/L (ref 96–112)
Creatinine, Ser: 0.84 mg/dL (ref 0.50–1.35)
Glucose, Bld: 127 mg/dL — ABNORMAL HIGH (ref 70–99)
POTASSIUM: 3.8 meq/L (ref 3.7–5.3)
Sodium: 141 mEq/L (ref 137–147)

## 2013-06-12 LAB — PROTIME-INR
INR: 1.2 (ref 0.00–1.49)
PROTHROMBIN TIME: 14.9 s (ref 11.6–15.2)

## 2013-06-12 LAB — MRSA PCR SCREENING: MRSA by PCR: NEGATIVE

## 2013-06-12 LAB — HEMOGLOBIN A1C
Hgb A1c MFr Bld: 5.5 % (ref <5.7)
MEAN PLASMA GLUCOSE: 111 mg/dL (ref <117)

## 2013-06-12 SURGERY — IRRIGATION AND DEBRIDEMENT EXTREMITY
Anesthesia: General | Laterality: Left

## 2013-06-12 MED ORDER — INSULIN ASPART 100 UNIT/ML ~~LOC~~ SOLN
0.0000 [IU] | SUBCUTANEOUS | Status: DC
  Administered 2013-06-12 – 2013-06-13 (×4): 1 [IU] via SUBCUTANEOUS
  Administered 2013-06-13: 2 [IU] via SUBCUTANEOUS

## 2013-06-12 MED ORDER — ALBUTEROL SULFATE (2.5 MG/3ML) 0.083% IN NEBU: 3.0000 mL | INHALATION_SOLUTION | Freq: Four times a day (QID) | RESPIRATORY_TRACT | Status: DC | PRN

## 2013-06-12 MED ORDER — PANTOPRAZOLE SODIUM 40 MG PO TBEC: 40.0000 mg | DELAYED_RELEASE_TABLET | Freq: Every day | ORAL | Status: DC | PRN

## 2013-06-12 MED ORDER — ADULT MULTIVITAMIN W/MINERALS CH
1.0000 | ORAL_TABLET | Freq: Every day | ORAL | Status: DC
  Administered 2013-06-13: 1 via ORAL
  Filled 2013-06-12 (×2): qty 1

## 2013-06-12 MED ORDER — CHLORHEXIDINE GLUCONATE 4 % EX LIQD
60.0000 mL | Freq: Once | CUTANEOUS | Status: AC
  Administered 2013-06-12: 4 via TOPICAL
  Filled 2013-06-12: qty 60

## 2013-06-12 MED ORDER — ONDANSETRON HCL 4 MG PO TABS: 4.0000 mg | ORAL_TABLET | Freq: Four times a day (QID) | ORAL | Status: DC | PRN

## 2013-06-12 MED ORDER — OXYCODONE-ACETAMINOPHEN 5-325 MG PO TABS
1.0000 | ORAL_TABLET | Freq: Four times a day (QID) | ORAL | Status: DC | PRN
  Administered 2013-06-12 (×3): 2 via ORAL
  Filled 2013-06-12 (×3): qty 2

## 2013-06-12 MED ORDER — ONDANSETRON HCL 4 MG/2ML IJ SOLN: 4.0000 mg | Freq: Four times a day (QID) | INTRAMUSCULAR | Status: DC | PRN

## 2013-06-12 MED ORDER — SODIUM CHLORIDE 0.9 % IJ SOLN: 3.0000 mL | INTRAMUSCULAR | Status: DC | PRN

## 2013-06-12 MED ORDER — OXYCODONE HCL 5 MG PO TABS
5.0000 mg | ORAL_TABLET | ORAL | Status: DC | PRN
  Administered 2013-06-12 – 2013-06-13 (×3): 5 mg via ORAL
  Filled 2013-06-12 (×3): qty 1

## 2013-06-12 MED ORDER — NICOTINE 21 MG/24HR TD PT24
21.0000 mg | MEDICATED_PATCH | Freq: Every day | TRANSDERMAL | Status: DC
  Administered 2013-06-12 – 2013-06-13 (×2): 21 mg via TRANSDERMAL
  Filled 2013-06-12 (×3): qty 1

## 2013-06-12 MED ORDER — ONDANSETRON HCL 4 MG PO TABS: 4.0000 mg | ORAL_TABLET | Freq: Three times a day (TID) | ORAL | Status: DC | PRN

## 2013-06-12 MED ORDER — ALPRAZOLAM 0.25 MG PO TABS
0.2500 mg | ORAL_TABLET | Freq: Three times a day (TID) | ORAL | Status: DC | PRN
  Administered 2013-06-12 – 2013-06-13 (×4): 0.25 mg via ORAL
  Filled 2013-06-12 (×4): qty 1

## 2013-06-12 MED ORDER — MORPHINE SULFATE 2 MG/ML IJ SOLN: 2.0000 mg | INTRAMUSCULAR | Status: DC | PRN

## 2013-06-12 MED ORDER — HYDROXYZINE HCL 25 MG PO TABS
25.0000 mg | ORAL_TABLET | Freq: Two times a day (BID) | ORAL | Status: DC | PRN
  Administered 2013-06-12 (×2): 25 mg via ORAL
  Filled 2013-06-12 (×2): qty 1

## 2013-06-12 MED ORDER — MORPHINE SULFATE 2 MG/ML IJ SOLN
2.0000 mg | INTRAMUSCULAR | Status: DC | PRN
  Administered 2013-06-12 – 2013-06-13 (×3): 2 mg via INTRAVENOUS
  Filled 2013-06-12 (×4): qty 1

## 2013-06-12 MED ORDER — LISINOPRIL 5 MG PO TABS
5.0000 mg | ORAL_TABLET | Freq: Every day | ORAL | Status: DC
  Administered 2013-06-13: 5 mg via ORAL
  Filled 2013-06-12 (×2): qty 1

## 2013-06-12 MED ORDER — ATENOLOL 50 MG PO TABS
50.0000 mg | ORAL_TABLET | Freq: Every day | ORAL | Status: DC
  Administered 2013-06-12 – 2013-06-13 (×2): 50 mg via ORAL
  Filled 2013-06-12 (×2): qty 1

## 2013-06-12 MED ORDER — VANCOMYCIN HCL IN DEXTROSE 750-5 MG/150ML-% IV SOLN
750.0000 mg | Freq: Three times a day (TID) | INTRAVENOUS | Status: DC
Start: 2013-06-12 — End: 2013-06-13
  Administered 2013-06-12 – 2013-06-13 (×6): 750 mg via INTRAVENOUS
  Filled 2013-06-12 (×8): qty 150

## 2013-06-12 MED ORDER — SODIUM CHLORIDE 0.9 % IV SOLN: 250.0000 mL | INTRAVENOUS | Status: DC | PRN

## 2013-06-12 MED ORDER — SODIUM CHLORIDE 0.9 % IJ SOLN
3.0000 mL | Freq: Two times a day (BID) | INTRAMUSCULAR | Status: DC
  Administered 2013-06-12 – 2013-06-13 (×2): 3 mL via INTRAVENOUS

## 2013-06-12 SURGICAL SUPPLY — 51 items
BANDAGE CONFORM 2  STR LF (GAUZE/BANDAGES/DRESSINGS) IMPLANT
BANDAGE ELASTIC 3 VELCRO ST LF (GAUZE/BANDAGES/DRESSINGS) ×3 IMPLANT
BANDAGE ELASTIC 4 VELCRO ST LF (GAUZE/BANDAGES/DRESSINGS) ×3 IMPLANT
BANDAGE GAUZE ELAST BULKY 4 IN (GAUZE/BANDAGES/DRESSINGS) ×6 IMPLANT
BNDG CMPR 9X4 STRL LF SNTH (GAUZE/BANDAGES/DRESSINGS)
BNDG ESMARK 4X9 LF (GAUZE/BANDAGES/DRESSINGS) IMPLANT
CORDS BIPOLAR (ELECTRODE) IMPLANT
COVER SURGICAL LIGHT HANDLE (MISCELLANEOUS) ×6 IMPLANT
CUFF TOURNIQUET SINGLE 18IN (TOURNIQUET CUFF) ×3 IMPLANT
DECANTER SPIKE VIAL GLASS SM (MISCELLANEOUS) ×3 IMPLANT
DRAIN PENROSE 1/4X12 LTX STRL (WOUND CARE) IMPLANT
DRAPE SURG 17X23 STRL (DRAPES) ×3 IMPLANT
DRSG PAD ABDOMINAL 8X10 ST (GAUZE/BANDAGES/DRESSINGS) ×6 IMPLANT
DURAPREP 26ML APPLICATOR (WOUND CARE) IMPLANT
ELECT REM PT RETURN 9FT ADLT (ELECTROSURGICAL)
ELECTRODE REM PT RTRN 9FT ADLT (ELECTROSURGICAL) IMPLANT
GAUZE PACKING IODOFORM 1/4X5 (PACKING) IMPLANT
GAUZE XEROFORM 1X8 LF (GAUZE/BANDAGES/DRESSINGS) ×3 IMPLANT
GLOVE BIO SURGEON STRL SZ8.5 (GLOVE) ×3 IMPLANT
GOWN STRL REUS W/ TWL LRG LVL3 (GOWN DISPOSABLE) ×1 IMPLANT
GOWN STRL REUS W/ TWL XL LVL3 (GOWN DISPOSABLE) ×1 IMPLANT
GOWN STRL REUS W/TWL LRG LVL3 (GOWN DISPOSABLE) ×3
GOWN STRL REUS W/TWL XL LVL3 (GOWN DISPOSABLE) ×3
HANDPIECE INTERPULSE COAX TIP (DISPOSABLE)
KIT BASIN OR (CUSTOM PROCEDURE TRAY) ×3 IMPLANT
KIT ROOM TURNOVER OR (KITS) ×3 IMPLANT
MANIFOLD NEPTUNE II (INSTRUMENTS) ×3 IMPLANT
NDL HYPO 25GX1X1/2 BEV (NEEDLE) IMPLANT
NDL HYPO 25X1 1.5 SAFETY (NEEDLE) ×1 IMPLANT
NEEDLE HYPO 25GX1X1/2 BEV (NEEDLE) IMPLANT
NEEDLE HYPO 25X1 1.5 SAFETY (NEEDLE) ×3 IMPLANT
NS IRRIG 1000ML POUR BTL (IV SOLUTION) ×3 IMPLANT
PACK ORTHO EXTREMITY (CUSTOM PROCEDURE TRAY) ×3 IMPLANT
PAD ARMBOARD 7.5X6 YLW CONV (MISCELLANEOUS) ×6 IMPLANT
PAD CAST 4YDX4 CTTN HI CHSV (CAST SUPPLIES) ×2 IMPLANT
PADDING CAST COTTON 4X4 STRL (CAST SUPPLIES) ×6
SET HNDPC FAN SPRY TIP SCT (DISPOSABLE) IMPLANT
SPONGE GAUZE 4X4 12PLY (GAUZE/BANDAGES/DRESSINGS) ×6 IMPLANT
SPONGE LAP 18X18 X RAY DECT (DISPOSABLE) ×3 IMPLANT
SUCTION FRAZIER TIP 10 FR DISP (SUCTIONS) ×3 IMPLANT
SUT VICRYL RAPIDE 4/0 PS 2 (SUTURE) IMPLANT
SYR 20CC LL (SYRINGE) IMPLANT
SYR CONTROL 10ML LL (SYRINGE) ×3 IMPLANT
TOWEL OR 17X24 6PK STRL BLUE (TOWEL DISPOSABLE) ×3 IMPLANT
TOWEL OR 17X26 10 PK STRL BLUE (TOWEL DISPOSABLE) ×3 IMPLANT
TUBE ANAEROBIC SPECIMEN COL (MISCELLANEOUS) IMPLANT
TUBE CONNECTING 12'X1/4 (SUCTIONS) ×1
TUBE CONNECTING 12X1/4 (SUCTIONS) ×2 IMPLANT
UNDERPAD 30X30 INCONTINENT (UNDERPADS AND DIAPERS) ×3 IMPLANT
WATER STERILE IRR 1000ML POUR (IV SOLUTION) ×3 IMPLANT
YANKAUER SUCT BULB TIP NO VENT (SUCTIONS) ×3 IMPLANT

## 2013-06-12 NOTE — ED Provider Notes (Signed)
Medical screening examination/treatment/procedure(s) were performed by non-physician practitioner and as supervising physician I was immediately available for consultation/collaboration.   EKG Interpretation   Date/Time:  Sunday Jun 11 2013 22:39:51 EDT Ventricular Rate:  83 PR Interval:  189 QRS Duration: 98 QT Interval:  406 QTC Calculation: 477 R Axis:   71 Text Interpretation:  Sinus rhythm ST elev, probable normal early repol  pattern Borderline prolonged QT interval Confirmed by Christy Gentles  MD, Soo Steelman  [phone]) on 06/11/2013 10:45:50 PM        Sharyon Cable, MD 06/12/13 2356
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
## 2013-06-12 NOTE — Progress Notes (Signed)
UR completed. Patient changed to inpatient- requiring IV antibiotics- failed op treatment.

## 2013-06-12 NOTE — Progress Notes (Signed)
[facility] PROGRESS NOTE  [name] DUK:[id number] DOB: 06-21-1960 DOA: 06/11/2013 PCP: Elnora Morrison, MD  Assessment/Plan: Principal Problem:   Cellulitis of hand: Improving significantly with IV vancomycin. Hand surgery has currently stopped plans for surgery. Active Problems:   DIABETES MELLITUS: CBGs remained stable.   HYPERLIPIDEMIA   SLEEP APNEA, OBSTRUCTIVE, MODERATE   HYPERTENSION, BENIGN ESSENTIAL: Stable   PERIPHERAL VASCULAR DISEASE   COPD: Stable   Carotid stenosis, bilateral   Tobacco abuse: Patient tried to go outside to smoke. Was told that he cannot do this.     Code Status: Full code Family Communication: Patient declined from a: 1.  Disposition Plan: Home in the next few days.   Consultants:  Hand surgery  Procedures:  None  Antibiotics:  IV vancomycin 5/31-present  HPI/Subjective: Patient doing okay. Less pain in his hand along with swelling and redness  Objective: Filed Vitals:   06/12/13 0442  BP: 125/48  Pulse: 88  Temp: 99.1 F (37.3 C)  Resp: 20    Intake/Output Summary (Last 24 hours) at 06/12/13 1645 Last data filed at 06/12/13 0730  Gross per 24 hour  Intake      0 ml  Output      0 ml  Net      0 ml   Filed Weights   06/12/13 0100 06/12/13 0131  Weight: 67 kg (147 lb 11.3 oz) 68.765 kg (151 lb 9.6 oz)    Exam:   General:  Alert and oriented x3, no acute distress  Cardiovascular: Regular rate and rhythm, S1-S2  Respiratory: Decreased breath sounds throughout  Abdomen: Soft, nontender, nondistended, positive bowel sounds  Musculoskeletal: Patient's left upper extremity notes mild swelling of the dorsum of the hand with no visible erythema   Data Reviewed: Basic Metabolic Panel:  Recent Labs Lab 06/09/13 1207 06/11/13 2150 06/12/13 0554  NA 142 139 141  K 3.8 3.3* 3.8  CL 100 103 108  CO2  --  23 22  GLUCOSE 85 139* 127*  BUN 9 13 10   CREATININE 0.90 0.92 0.84  CALCIUM  --  8.8 8.5   Liver  Function Tests: No results found for this basename: AST, ALT, ALKPHOS, BILITOT, PROT, ALBUMIN,  in the last 168 hours No results found for this basename: LIPASE, AMYLASE,  in the last 168 hours No results found for this basename: AMMONIA,  in the last 168 hours CBC:  Recent Labs Lab 06/09/13 1154 06/09/13 1207 06/11/13 2150 06/12/13 0554  WBC 6.5  --  5.6 4.6  NEUTROABS 3.7  --  2.4  --   HGB 12.7* 14.6 12.0* 11.2*  HCT 37.6* 43.0 36.2* 34.7*  MCV 89.5  --  89.8 90.4  PLT 249  --  233 206   Cardiac Enzymes: No results found for this basename: CKTOTAL, CKMB, CKMBINDEX, TROPONINI,  in the last 168 hours BNP (last 3 results) No results found for this basename: PROBNP,  in the last 8760 hours CBG:  Recent Labs Lab 06/12/13 0526 06/12/13 0818 06/12/13 1140 06/12/13 1606  GLUCAP 92 87 84 142*    Recent Results (from the past 240 hour(s))  MRSA PCR SCREENING     Status: None   Collection Time    06/12/13  8:58 AM      Result Value Ref Range Status   MRSA by PCR NEGATIVE  NEGATIVE Final   Comment:            The GeneXpert MRSA Assay (FDA  approved for NASAL specimens     only), is one component of a     comprehensive MRSA colonization     surveillance program. It is not     intended to diagnose MRSA     infection nor to guide or     monitor treatment for     MRSA infections.     Studies: Dg Chest 2 View  06/11/2013   CLINICAL DATA:  Preop exam.  Pain and swelling of the left hand.  EXAM: CHEST  2 VIEW  COMPARISON:  05/12/2013  FINDINGS: Heart, mediastinum and hila are unremarkable.  There is bilateral interstitial thickening. The lungs are hyperexpanded. Mild scarring is noted at the apices. No lung consolidation or edema. No pleural effusion or pneumothorax.  Multiple old healed left rib fractures. Patient has had a previous low anterior cervical spine fusion.  IMPRESSION: No acute cardiopulmonary disease. Stable appearance from the prior study.   Electronically  Signed   By: Lajean Manes M.D.   On: 06/11/2013 23:49    Scheduled Meds: . atenolol  50 mg Oral Daily  . insulin aspart  0-9 Units Subcutaneous 6 times per day  . lisinopril  5 mg Oral Daily  . multivitamin with minerals  1 tablet Oral Daily  . nicotine  21 mg Transdermal Daily  . sodium chloride  3 mL Intravenous Q12H  . vancomycin  750 mg Intravenous Q8H   Continuous Infusions:   Principal Problem:   Cellulitis of hand Active Problems:   DIABETES MELLITUS   HYPERLIPIDEMIA   SLEEP APNEA, OBSTRUCTIVE, MODERATE   HYPERTENSION, BENIGN ESSENTIAL   PERIPHERAL VASCULAR DISEASE   COPD   Carotid stenosis, bilateral   Tobacco abuse   Wound infection   H/O ETOH abuse sober ten years   Cellulitis    Time spent: 15 minutes    Lace Chenevert [facility] Pager [phone]. If 7PM-7AM, please contact night-coverage at [email or web address], password [email or web address] 06/12/2013, 4:45 PM  LOS: 1 day

## 2013-06-12 NOTE — Progress Notes (Signed)
IVF started via pump. Patient awoke during process. Patient demands MD change analgesic to stronger medicine or change time back to every 2 hours, after inquiring if morphine was due yet. Page sent to Dr Shanon Brow.

## 2013-06-12 NOTE — Progress Notes (Signed)
Patient ID: [name], male   DOB: May 06, 1960, 53 y.o.   MRN: [id number] Patient much improved on IV abx  Would continue times 24 hours and d/c on po bactrim   Will see in my office Thursday  Surgery today cancelled

## 2013-06-12 NOTE — ED Provider Notes (Signed)
History/physical exam/procedure(s) were performed by non-physician practitioner and as supervising physician I was immediately available for consultation/collaboration. I have reviewed all notes and am in agreement with care and plan.   Shaune Pollack, MD 06/12/13 [phone]

## 2013-06-13 LAB — GLUCOSE, CAPILLARY
GLUCOSE-CAPILLARY: 118 mg/dL — AB (ref 70–99)
GLUCOSE-CAPILLARY: 136 mg/dL — AB (ref 70–99)
Glucose-Capillary: 142 mg/dL — ABNORMAL HIGH (ref 70–99)
Glucose-Capillary: 87 mg/dL (ref 70–99)

## 2013-06-13 MED ORDER — OXYCODONE HCL 5 MG PO TABS: 5.0000 mg | ORAL_TABLET | ORAL | Status: DC | PRN

## 2013-06-13 MED ORDER — HYDROMORPHONE HCL PF 1 MG/ML IJ SOLN
0.5000 mg | INTRAMUSCULAR | Status: DC | PRN
  Administered 2013-06-13 (×2): 0.5 mg via INTRAVENOUS
  Filled 2013-06-13 (×2): qty 1

## 2013-06-13 MED ORDER — CLOPIDOGREL BISULFATE 75 MG PO TABS
75.0000 mg | ORAL_TABLET | Freq: Every day | ORAL | Status: DC
  Administered 2013-06-13: 75 mg via ORAL
  Filled 2013-06-13 (×3): qty 1

## 2013-06-13 NOTE — Discharge Summary (Signed)
Physician Discharge Summary  [name] IWL:[id number] DOB: [birth year] DOA: 06/11/2013  PCP: Elnora Morrison, MD  Admit date: 06/11/2013 Discharge date: 06/13/2013  Time spent: 25 minutes  Recommendations for Outpatient Follow-up:  1. Patient will followup with Dr. Burney Gauze, and surgery on Thursday 6/4 at 9:30 AM in their office 2. He'll followup with the internal medicine clinic teaching service as needed as his primary care physician 3. New medication: OxyIR 5 mg every 4 hours when necessary pain 4. New medication Bactrim DS one by mouth twice a day x7 days   Discharge Diagnoses:  Principal Problem:   Cellulitis of hand Active Problems:   DIABETES MELLITUS   HYPERLIPIDEMIA   SLEEP APNEA, OBSTRUCTIVE, MODERATE   HYPERTENSION, BENIGN ESSENTIAL   PERIPHERAL VASCULAR DISEASE   COPD   Carotid stenosis, bilateral   Tobacco abuse   Wound infection   H/O ETOH abuse sober ten years   Cellulitis   Discharge Condition: Improved, being discharged home  Diet recommendation: Carb modified, heart healthy  Filed Weights   06/12/13 0100 06/12/13 0131  Weight: 67 kg (147 lb 11.3 oz) 68.765 kg (151 lb 9.6 oz)    History of present illness:  53 year old white male with past medical history of tobacco abuse and COPD plus diabetes and hypertension who had scraped his left arm/hand on some metal approximately one week prior and came into the emergency room on 5/29 with left hand swelling, erythema and tenderness x2 days. Patient was noted by x-ray to have a 4 mm metallic foreign object in the scan. He was evaluated by hand surgery. He was given a dose of IV vancomycin and discharged home on Bactrim. Initially, patient was supposed to come back on 5/30, he did not. He then came back on 5/31 night for followup and it was difficult to say whether patient's hand was better or worse. Patient was admitted to the hospital service for IV antibiotics and evaluation for possible hand surgery.  Hospital  Course:  Principal Problem:   Cellulitis of hand: Patient started on IV vancomycin and given medication for pain control. He was seen by hand surgery on 6/1 felt overall scan was improving. He was continued on IV antibiotics for one more day and felt to be much improved and discharged home on 6/2. He'll be discharged on continued by mouth Bactrim this medication for pain and has a followup appointment in 2 days with hand surgery in their office.  Active Problems:   DIABETES MELLITUS: Patient CBGs remained stable during his hospitalization. He managed on sliding scale only.     HYPERLIPIDEMIA: Stable medical issue   SLEEP APNEA, OBSTRUCTIVE, MODERATE: Stable medical issue   HYPERTENSION, BENIGN ESSENTIAL: Stable medical issue. Blood pressures remained stable during this hospitalization   PERIPHERAL VASCULAR DISEASE: Stable medical issue   COPD: Stable medical issue   Carotid stenosis, bilateral: Stable medical issue    Tobacco abuse: Patient had smoked. Except a nicotine patch during his hospitalization    H/O ETOH abuse sober ten years   Procedures:  None  Consultations:  Hand surgery  Discharge Exam: Filed Vitals:   06/13/13 0949  BP: 140/69  Pulse: 78  Temp:   Resp:     General: Alert and oriented x3, no acute distress Cardiovascular: Regular rate and rhythm, S1-S2 Respiratory: Clear to auscultation bilaterally Extremities: Left hand notes mild swelling, less tenderness, decreased erythema in comparison to the day prior  Discharge Instructions You were cared for by a hospitalist during your hospital stay.  If you have any questions about your discharge medications or the care you received while you were in the hospital after you are discharged, you can call the unit and asked to speak with the hospitalist on call if the hospitalist that took care of you is not available. Once you are discharged, your primary care physician will handle any further medical issues. Please note  that NO REFILLS for any discharge medications will be authorized once you are discharged, as it is imperative that you return to your primary care physician (or establish a relationship with a primary care physician if you do not have one) for your aftercare needs so that they can reassess your need for medications and monitor your lab values.  Discharge Instructions   Diet - low sodium heart healthy    Complete by:  As directed      Increase activity slowly    Complete by:  As directed             Medication List    STOP taking these medications       oxyCODONE-acetaminophen 5-325 MG per tablet  Commonly known as:  PERCOCET/ROXICET      TAKE these medications       ACCU-CHEK AVIVA PLUS test strip  Generic drug:  glucose blood  USE TO TEST BLOOD SUGAR ONCE DAILY     ACCU-CHEK FASTCLIX LANCETS Misc  USE TO TEST BLOOD SUGAR ONCE DAILY     albuterol 108 (90 BASE) MCG/ACT inhaler  Commonly known as:  PROVENTIL HFA;VENTOLIN HFA  Inhale 2 puffs into the lungs every 6 (six) hours as needed for wheezing or shortness of breath.     aspirin EC 81 MG tablet  Take 81 mg by mouth daily.     atenolol 50 MG tablet  Commonly known as:  TENORMIN  Take 50 mg by mouth daily.     clopidogrel 75 MG tablet  Commonly known as:  PLAVIX  Take 75 mg by mouth daily with breakfast.     diclofenac sodium 1 % Gel  Commonly known as:  VOLTAREN  Apply 2 g topically 4 (four) times daily as needed (pain). Apply 1 application topically to knees 4 times per day as needed for pain     fenofibrate 48 MG tablet  Commonly known as:  TRICOR  Take 48 mg by mouth daily.     hydrocortisone cream 1 %  Apply 1 application topically 2 (two) times daily as needed for itching.     hydrOXYzine 25 MG tablet  Commonly known as:  ATARAX/VISTARIL  Take 25 mg by mouth 2 (two) times daily as needed for itching.     lisinopril 5 MG tablet  Commonly known as:  PRINIVIL,ZESTRIL  Take 5 mg by mouth daily.      multivitamin with minerals Tabs tablet  Take 1 tablet by mouth daily.     ondansetron 4 MG tablet  Commonly known as:  ZOFRAN  Take 4 mg by mouth every 8 (eight) hours as needed for nausea or vomiting.     oxyCODONE 5 MG immediate release tablet  Commonly known as:  Oxy IR/ROXICODONE  Take 1 tablet (5 mg total) by mouth every 4 (four) hours as needed for severe pain.     pantoprazole 40 MG tablet  Commonly known as:  PROTONIX  Take 40 mg by mouth daily as needed (heartburn).     ranitidine 150 MG tablet  Commonly known as:  ZANTAC  Take 150 mg  by mouth 2 (two) times daily as needed for heartburn (acid reflux).     sulfamethoxazole-trimethoprim 800-160 MG per tablet  Commonly known as:  SEPTRA DS  Take 1 tablet by mouth every 12 (twelve) hours.       Allergies  Allergen Reactions  . Fish Allergy Hives, Swelling and Rash  . Buprenorphine Hcl-Naloxone Hcl Other (See Comments)    "feel like going to die" jittery, trouble breathing, feels hot  . Shellfish Allergy Hives  . Benadryl [Diphenhydramine Hcl] Hives, Itching and Rash       Follow-up Information   Follow up with Elnora Morrison, MD In 1 month.   Specialty:  Internal Medicine   Contact information:   [place] [address] [phone]       Follow up with Schuyler Amor, MD On 06/15/2013. (9:30 am)    Specialty:  Orthopedic Surgery   Contact information:   [place] [address] [phone]        The results of significant diagnostics from this hospitalization (including imaging, microbiology, ancillary and laboratory) are listed below for reference.    Significant Diagnostic Studies: Dg Chest 2 View  06/11/2013   IMPRESSION: No acute cardiopulmonary disease. Stable appearance from the prior study.   Electronically Signed   By: Lajean Manes M.D.   On: 06/11/2013 23:49   Dg Hand Complete Right  06/09/2013   \ IMPRESSION: No acute bony finding. 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  Pronounced dorsal soft tissue swelling.  4 mm metallic foreign object in the scan or subcutaneous tissues dorsal to the carpus. Chronicity of this is uncertain based on radiography.   Electronically Signed   By: Nelson Chimes M.D.   On: 06/09/2013 12:37     Microbiology: Recent Results (from the past 240 hour(s))  MRSA PCR SCREENING     Status: None   Collection Time    06/12/13  8:58 AM      Result Value Ref Range Status   MRSA by PCR NEGATIVE  NEGATIVE Final   Comment:            The GeneXpert MRSA Assay (FDA     approved for NASAL specimens     only), is one component of a     comprehensive MRSA colonization     surveillance program. It is not     intended to diagnose MRSA     infection nor to guide or     monitor treatment for     MRSA infections.     Labs: Basic Metabolic Panel:  Recent Labs Lab 06/09/13 1207 06/11/13 2150 06/12/13 0554  NA 142 139 141  K 3.8 3.3* 3.8  CL 100 103 108  CO2  --  23 22  GLUCOSE 85 139* 127*  BUN 9 13 10   CREATININE 0.90 0.92 0.84  CALCIUM  --  8.8 8.5   Liver Function Tests: No results found for this basename: AST, ALT, ALKPHOS, BILITOT, PROT, ALBUMIN,  in the last 168 hours No results found for this basename: LIPASE, AMYLASE,  in the last 168 hours No results found for this basename: AMMONIA,  in the last 168 hours CBC:  Recent Labs Lab 06/09/13 1154 06/09/13 1207 06/11/13 2150 06/12/13 0554  WBC 6.5  --  5.6 4.6  NEUTROABS 3.7  --  2.4  --   HGB 12.7* 14.6 12.0* 11.2*  HCT 37.6* 43.0 36.2* 34.7*  MCV 89.5  --  89.8 90.4  PLT 249  --  233 206   Cardiac Enzymes: No results found for this basename: CKTOTAL, CKMB, CKMBINDEX, TROPONINI,  in the last 168 hours BNP: BNP (last 3 results) No results found for this basename: PROBNP,  in the last 8760 hours CBG:  Recent Labs Lab 06/12/13 2141 06/13/13 0003 06/13/13 0407 06/13/13 0845 06/13/13 1142  GLUCAP 129* 142* 118* 136* 87        Signed:  Kahdijah Errickson K Atalaya Zappia  [facility] 06/13/2013, 5:43 PM

## 2013-06-13 NOTE — Discharge Instructions (Signed)
Cellulitis °Cellulitis is an infection of the skin and the tissue under the skin. The infected area is usually red and tender. This happens most often in the arms and lower legs. °HOME CARE  °· Take your antibiotic medicine as told. Finish the medicine even if you start to feel better. °· Keep the infected arm or leg raised (elevated). °· Put a warm cloth on the area up to 4 times per day. °· Only take medicines as told by your doctor. °· Keep all doctor visits as told. °GET HELP RIGHT AWAY IF:  °· You have a fever. °· You feel very sleepy. °· You throw up (vomit) or have watery poop (diarrhea). °· You feel sick and have muscle aches and pains. °· You see red streaks on the skin coming from the infected area. °· Your red area gets bigger or turns a dark color. °· Your bone or joint under the infected area is painful after the skin heals. °· Your infection comes back in the same area or different area. °· You have a puffy (swollen) bump in the infected area. °· You have new symptoms. °MAKE SURE YOU:  °· Understand these instructions. °· Will watch your condition. °· Will get help right away if you are not doing well or get worse. °Document Released: 06/17/2007 Document Revised: 06/30/2011 Document Reviewed: 03/16/2011 °[employer]® Patient Information ©2014 [employer]. ° °

## 2013-06-21 ENCOUNTER — Other Ambulatory Visit: Payer: Self-pay | Admitting: Internal Medicine

## 2013-06-30 ENCOUNTER — Other Ambulatory Visit (HOSPITAL_COMMUNITY): Payer: Self-pay | Admitting: Interventional Radiology

## 2013-06-30 DIAGNOSIS — G450 Vertebro-basilar artery syndrome: Secondary | ICD-10-CM

## 2013-07-13 ENCOUNTER — Other Ambulatory Visit: Payer: Self-pay | Admitting: Internal Medicine

## 2013-07-17 ENCOUNTER — Telehealth (HOSPITAL_COMMUNITY): Payer: Self-pay | Admitting: Interventional Radiology

## 2013-07-17 ENCOUNTER — Other Ambulatory Visit (HOSPITAL_COMMUNITY): Payer: Self-pay | Admitting: Interventional Radiology

## 2013-07-17 DIAGNOSIS — I771 Stricture of artery: Secondary | ICD-10-CM

## 2013-07-17 NOTE — Telephone Encounter (Signed)
Called pt, left VM for him to call to schedule clinical visit per Deveshwar - 2 month f/u of angio. JM

## 2013-07-17 NOTE — Telephone Encounter (Signed)
Message sent to front desk pool - needs appt with in next 30 days.

## 2013-07-17 NOTE — Telephone Encounter (Signed)
Patient has not been seen in quite a while (9/14) and prior to that, was only seen urgently in the year prior to that.  I will give him a one month supply of all of these, but he needs to be seen to re-establish with PCP.  If [name] is not available in the next month, he may need to be reassigned so that he can get in to see a PCP and not an urgent visit.   Thanks

## 2013-07-19 ENCOUNTER — Ambulatory Visit (HOSPITAL_COMMUNITY): Payer: Medicaid Other | Attending: Interventional Radiology

## 2013-08-02 ENCOUNTER — Emergency Department (HOSPITAL_COMMUNITY)
Admission: EM | Admit: 2013-08-02 | Discharge: 2013-08-02 | Disposition: A | Discharge status: Home / Self Care | Payer: Medicaid Other | Attending: Emergency Medicine | Admitting: Emergency Medicine

## 2013-08-02 ENCOUNTER — Emergency Department (HOSPITAL_COMMUNITY): Payer: Medicaid Other

## 2013-08-02 ENCOUNTER — Encounter (HOSPITAL_COMMUNITY): Payer: Self-pay | Admitting: Emergency Medicine

## 2013-08-02 DIAGNOSIS — Z8781 Personal history of (healed) traumatic fracture: Secondary | ICD-10-CM | POA: Diagnosis not present

## 2013-08-02 DIAGNOSIS — F17200 Nicotine dependence, unspecified, uncomplicated: Secondary | ICD-10-CM | POA: Diagnosis not present

## 2013-08-02 DIAGNOSIS — R0602 Shortness of breath: Secondary | ICD-10-CM | POA: Insufficient documentation

## 2013-08-02 DIAGNOSIS — Z8619 Personal history of other infectious and parasitic diseases: Secondary | ICD-10-CM | POA: Diagnosis not present

## 2013-08-02 DIAGNOSIS — Z8611 Personal history of tuberculosis: Secondary | ICD-10-CM | POA: Diagnosis not present

## 2013-08-02 DIAGNOSIS — J209 Acute bronchitis, unspecified: Secondary | ICD-10-CM | POA: Insufficient documentation

## 2013-08-02 DIAGNOSIS — Z7982 Long term (current) use of aspirin: Secondary | ICD-10-CM | POA: Insufficient documentation

## 2013-08-02 DIAGNOSIS — IMO0002 Reserved for concepts with insufficient information to code with codable children: Secondary | ICD-10-CM | POA: Insufficient documentation

## 2013-08-02 DIAGNOSIS — Z7902 Long term (current) use of antithrombotics/antiplatelets: Secondary | ICD-10-CM | POA: Insufficient documentation

## 2013-08-02 DIAGNOSIS — Z951 Presence of aortocoronary bypass graft: Secondary | ICD-10-CM | POA: Diagnosis not present

## 2013-08-02 DIAGNOSIS — I10 Essential (primary) hypertension: Secondary | ICD-10-CM | POA: Insufficient documentation

## 2013-08-02 DIAGNOSIS — J40 Bronchitis, not specified as acute or chronic: Secondary | ICD-10-CM

## 2013-08-02 DIAGNOSIS — R05 Cough: Secondary | ICD-10-CM | POA: Diagnosis not present

## 2013-08-02 DIAGNOSIS — R059 Cough, unspecified: Secondary | ICD-10-CM | POA: Insufficient documentation

## 2013-08-02 DIAGNOSIS — I739 Peripheral vascular disease, unspecified: Secondary | ICD-10-CM | POA: Diagnosis not present

## 2013-08-02 DIAGNOSIS — E119 Type 2 diabetes mellitus without complications: Secondary | ICD-10-CM | POA: Diagnosis not present

## 2013-08-02 DIAGNOSIS — Z79899 Other long term (current) drug therapy: Secondary | ICD-10-CM | POA: Diagnosis not present

## 2013-08-02 DIAGNOSIS — J440 Chronic obstructive pulmonary disease with acute lower respiratory infection: Secondary | ICD-10-CM | POA: Insufficient documentation

## 2013-08-02 DIAGNOSIS — Z8614 Personal history of Methicillin resistant Staphylococcus aureus infection: Secondary | ICD-10-CM | POA: Insufficient documentation

## 2013-08-02 DIAGNOSIS — Z9889 Other specified postprocedural states: Secondary | ICD-10-CM | POA: Diagnosis not present

## 2013-08-02 DIAGNOSIS — Z8673 Personal history of transient ischemic attack (TIA), and cerebral infarction without residual deficits: Secondary | ICD-10-CM | POA: Diagnosis not present

## 2013-08-02 DIAGNOSIS — G894 Chronic pain syndrome: Secondary | ICD-10-CM | POA: Insufficient documentation

## 2013-08-02 LAB — BASIC METABOLIC PANEL
ANION GAP: 15 (ref 5–15)
BUN: 12 mg/dL (ref 6–23)
CALCIUM: 9.8 mg/dL (ref 8.4–10.5)
CHLORIDE: 99 meq/L (ref 96–112)
CO2: 22 meq/L (ref 19–32)
Creatinine, Ser: 0.72 mg/dL (ref 0.50–1.35)
GFR calc non Af Amer: >90 mL/min (ref >90)
Glucose, Bld: 160 mg/dL — ABNORMAL HIGH (ref 70–99)
Potassium: 4.4 mEq/L (ref 3.7–5.3)
Sodium: 136 mEq/L — ABNORMAL LOW (ref 137–147)

## 2013-08-02 LAB — I-STAT TROPONIN, ED: TROPONIN I, POC: 0 ng/mL (ref 0.00–0.08)

## 2013-08-02 LAB — CBC
HCT: 41 % (ref 39.0–52.0)
Hemoglobin: 13.9 g/dL (ref 13.0–17.0)
MCH: 29.3 pg (ref 26.0–34.0)
MCHC: 33.9 g/dL (ref 30.0–36.0)
MCV: 86.5 fL (ref 78.0–100.0)
PLATELETS: 296 10*3/uL (ref 150–400)
RBC: 4.74 MIL/uL (ref 4.22–5.81)
RDW: 14 % (ref 11.5–15.5)
WBC: 11 10*3/uL — ABNORMAL HIGH (ref 4.0–10.5)

## 2013-08-02 IMAGING — XA IR ANGIO INTRA EXTRACRAN SEL COM CAROTID INNOMINATE BILAT MOD SE
1 series · 13 of 24 positions shown · IV contrast (IODINE)
Comparison: Catheter angiogram of 04/10/2010.

CLINICAL DATA: Patient with increased frequency of right-sided
upper extremity numbness and paresthesias, with a frequency of five
to six times a month.  History of severe extracranial
atherosclerotic disease.

RIGHT COMMON CAROTID ARTERY ARTERIOGRAM AND BILATERAL VERTEBRAL
ARTERY ANGIOGRAMS

[Series 300: neuro · 13 of 82 slices shown]
[im 1/82]
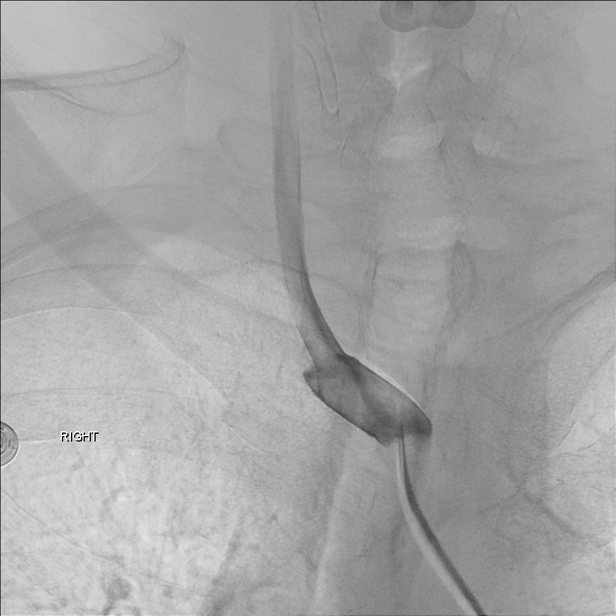
[im 8/82]
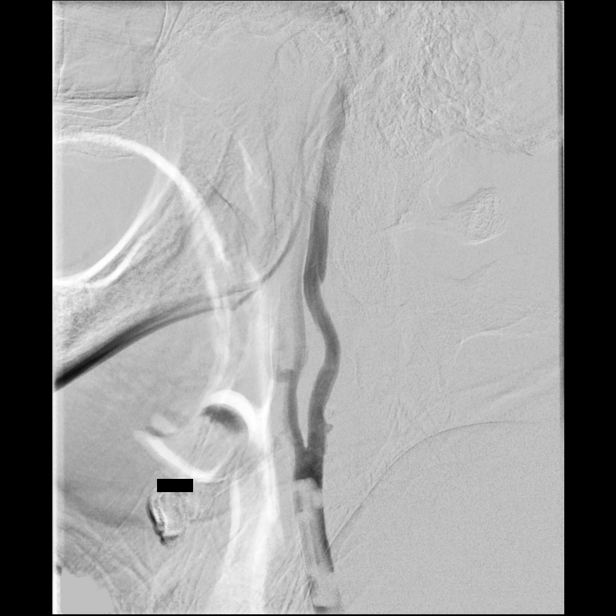
[im 15/82]
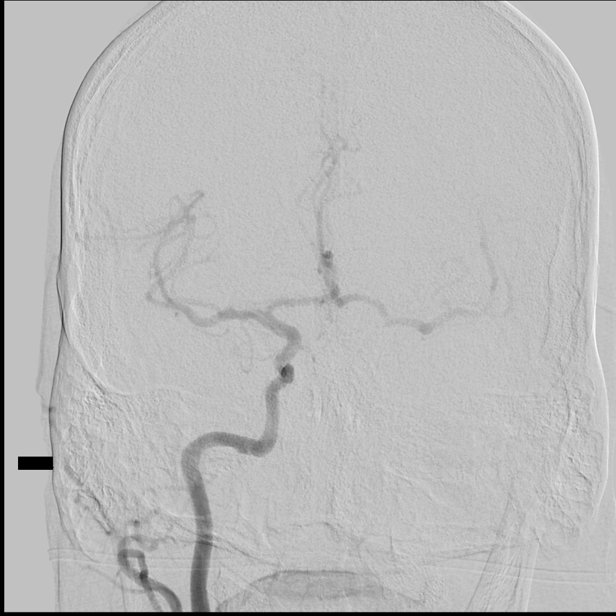
[im 22/82]
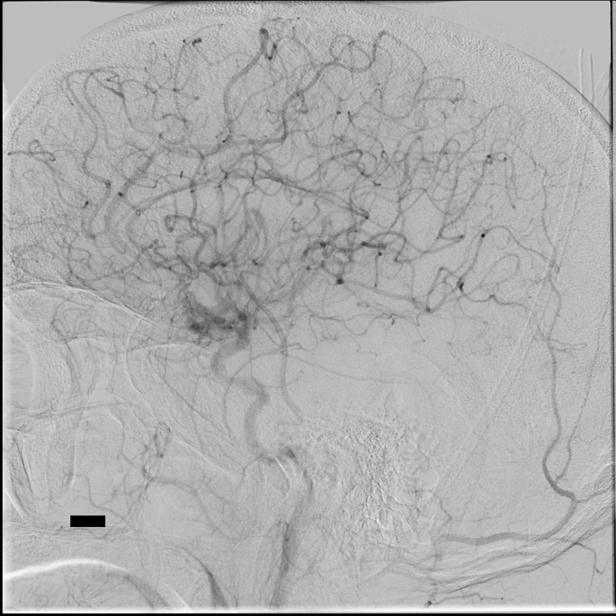
[im 29/82]
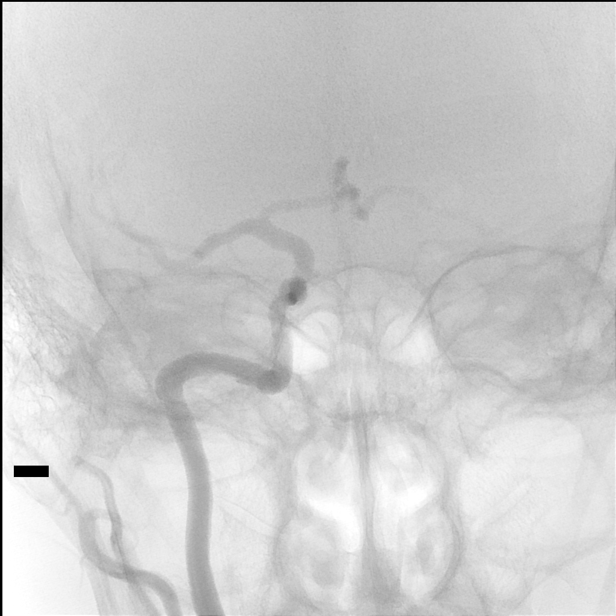
[im 36/82]
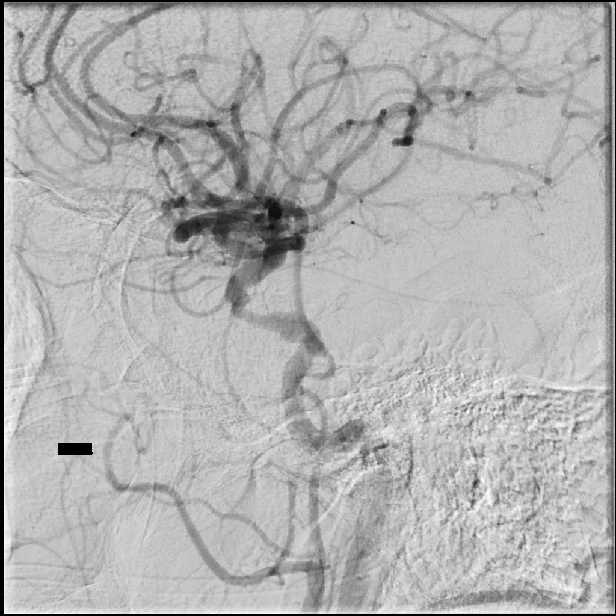
[im 43/82]
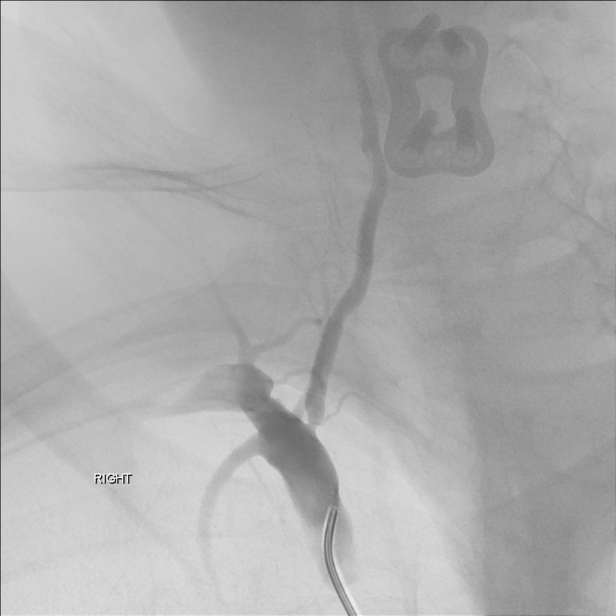
[im 46/82]
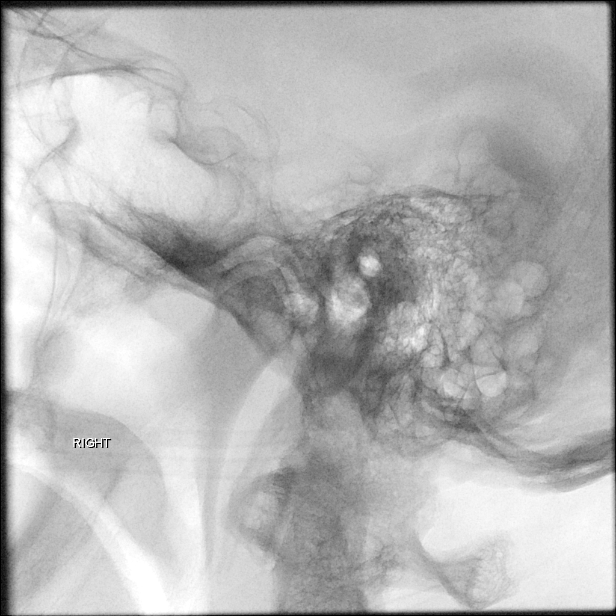
[im 53/82]
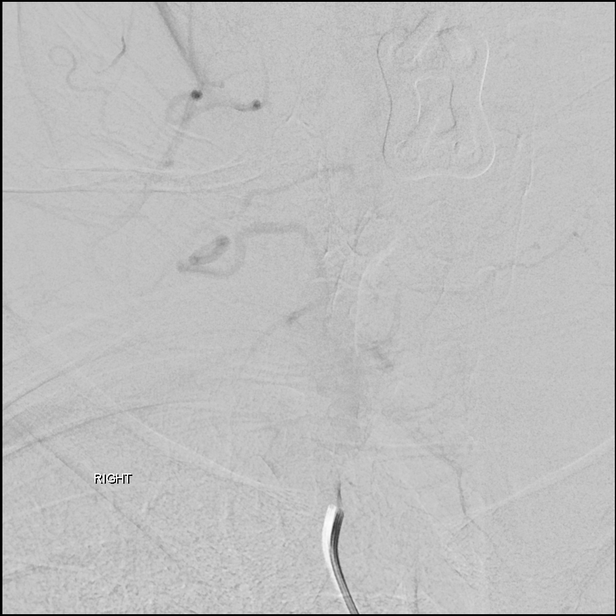
[im 60/82]
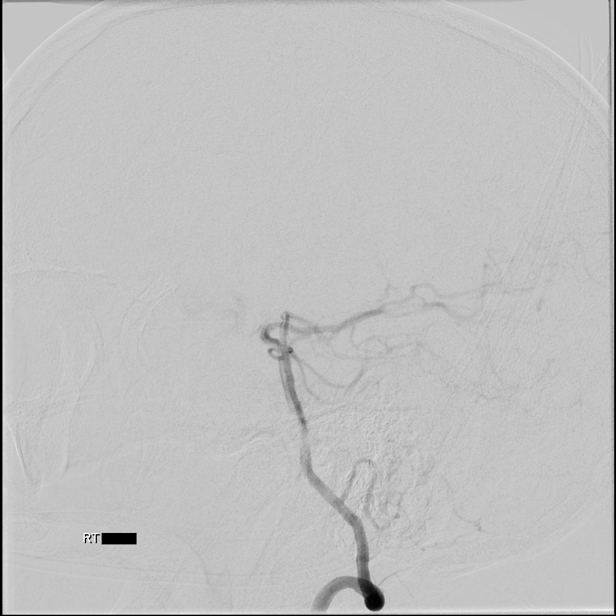
[im 67/82]
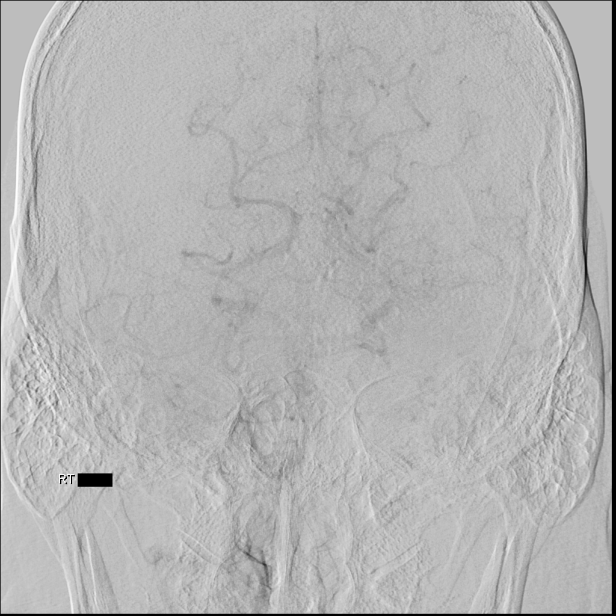
[im 74/82]
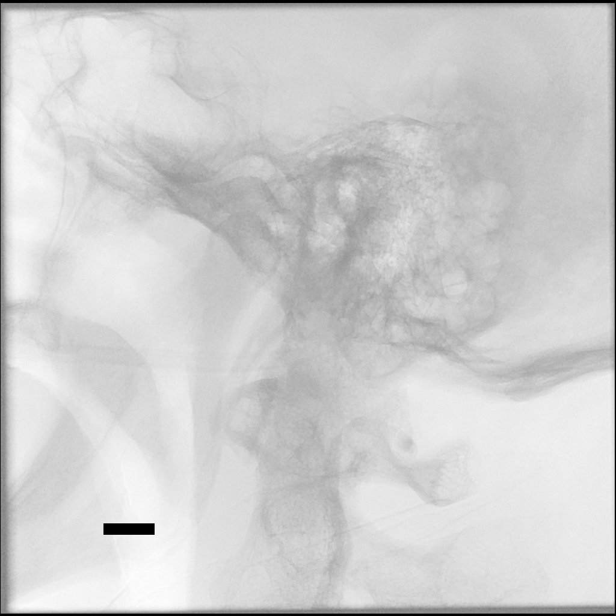
[im 82/82]
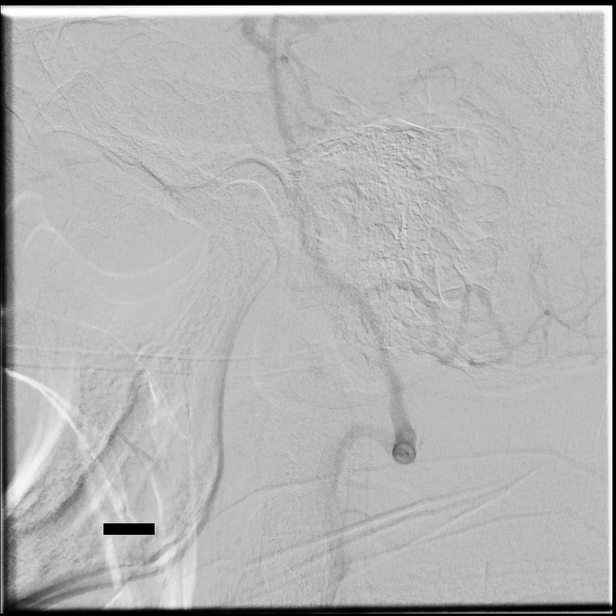

[13 of 24 positions shown; findings below may reference images not displayed]

Following a full explanation of the procedure along with the
potential associated complications, an informed witnessed consent
was obtained.

The right groin was prepped and draped in the usual sterile
fashion.  Thereafter using a modified Seldinger technique,
transfemoral access into the right common femoral artery was
obtained without difficulty.  Over a 0.035-inch guidewire, a 5-
French Pinnacle sheath was inserted.  Through this and also over a
0.035-inch guidewire, a 5-French JB1 catheter was advanced to the
aortic arch region and selectively positioned in the right
vertebral artery, the right common carotid artery and the left
subclavian artery.

There were no acute complications.  The patient tolerated the
procedure well.

Medications utilized: Versed 1 mg IV.  Fentanyl 25 mcg IV.

Contrast: 5mnipaque-D33 approximately 40 ml.
FINDINGS: The right common carotid arteriogram demonstrates the right
external carotid artery and its major branches to be normally
opacified.

The right internal carotid artery has a small ulcerative plaque
along the posterior wall with less than 10% stenosis by the NASCET
criteria.

The vessel otherwise opacifies normally to the cranial skull base.

The petrous and the proximal cavernous segments are normal.

In the caval segment the right internal carotid artery, again noted
is a circumferential significant narrowing secondary to an
atherosclerotic plaque measuring approximately 70%, associated with
a small focal outpouching projecting posteriorly probably
representing a pseudoaneurysm.

Distal to this, the supraclinoid segment is normal.

The right middle cerebral artery and the right anterior cerebral
artery opacify normally into the capillary and venous phases.

Prompt opacification via the anterior communicating artery of the
left anterior cerebral artery and the left middle cerebral
distribution is seen.

The right vertebral artery origin demonstrates approximately 65%
stenosis.  Distal to this the vessel opacifies normally to the
cranial skull base.

There is normal opacification of the right posterior-inferior
cerebellar artery and the right vertebrobasilar junction.

The proximal basilar artery demonstrates approximately 30% stenosis
secondary to an eccentric plaque.

Distal to this the basilar artery, the posterior cerebral arteries,
the superior cerebellar arteries opacify normally.

The anterior-inferior cerebellar arteries are also seen to opacify
normally into capillary and venous phases.

Unopacified blood is seen in the basilar artery from the
contralateral vertebral artery.

The left vertebral artery origin is normal.  The vessel opacifies
normally to the cranial skull base.  Focal areas of smooth
irregularity and narrowing are seen involving the left
vertebrobasilar junction just proximal to the left posterior-
inferior cerebellar artery.

These extend into the proximal basilar artery.

The left vertebrobasilar junction again suggests a mild focal
fenestration.  Distal to this the proximal basilar artery
demonstrates a focal stenosis as described previously.  Distal to
this, normal flow is seen into the distal basilar artery and its
branches as described previously.

IMPRESSION

1.  Approximately 70% stenosis of the right internal carotid artery
in the caval segment associated with a pseudoaneurysm. The
narrowing appears to have progressed angiographically compared to
the previous arteriogram.
2.  Approximately 60-65% stenosis of the right vertebral artery at
its origin.
3.  Prompt opacification of the left anterior and the left middle
cerebral arteries from the right internal carotid artery secondary
to the chronic left internal carotid artery occlusion.

Angiographic findings were reviewed with the patient.

He was instructed to continue taking his aspirin and Plavix.  Again
stressed was the need to control his diabetes.  He will return for
follow-up in the clinic in 2-3 weeks.

## 2013-08-02 MED ORDER — SODIUM CHLORIDE 0.9 % IV BOLUS (SEPSIS)
1000.0000 mL | Freq: Once | INTRAVENOUS | Status: AC
  Administered 2013-08-02: 1000 mL via INTRAVENOUS

## 2013-08-02 MED ORDER — AZITHROMYCIN 250 MG PO TABS: ORAL_TABLET | ORAL | Status: DC

## 2013-08-02 MED ORDER — IPRATROPIUM BROMIDE 0.02 % IN SOLN
0.5000 mg | Freq: Once | RESPIRATORY_TRACT | Status: AC
  Administered 2013-08-02: 0.5 mg via RESPIRATORY_TRACT
  Filled 2013-08-02: qty 2.5

## 2013-08-02 MED ORDER — ALBUTEROL SULFATE (2.5 MG/3ML) 0.083% IN NEBU
5.0000 mg | INHALATION_SOLUTION | Freq: Once | RESPIRATORY_TRACT | Status: AC
  Administered 2013-08-02: 5 mg via RESPIRATORY_TRACT
  Filled 2013-08-02: qty 6

## 2013-08-02 NOTE — ED Notes (Signed)
Pt c/o SOB and congestion x 3 days, denies pain. Pt has productive cough
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
## 2013-08-02 NOTE — Discharge Instructions (Signed)
Take antibiotic to completion. Follow up with your primary care doctor.   Bronchitis Bronchitis is inflammation of the airways that extend from the windpipe into the lungs (bronchi). The inflammation often causes mucus to develop, which leads to a cough. If the inflammation becomes severe, it may cause shortness of breath. CAUSES  Bronchitis may be caused by:   Viral infections.   Bacteria.   Cigarette smoke.   Allergens, pollutants, and other irritants.  SIGNS AND SYMPTOMS  The most common symptom of bronchitis is a frequent cough that produces mucus. Other symptoms include:  Fever.   Body aches.   Chest congestion.   Chills.   Shortness of breath.   Sore throat.  DIAGNOSIS  Bronchitis is usually diagnosed through a medical history and physical exam. Tests, such as chest X-rays, are sometimes done to rule out other conditions.  TREATMENT  You may need to avoid contact with whatever caused the problem (smoking, for example). Medicines are sometimes needed. These may include:  Antibiotics. These may be prescribed if the condition is caused by bacteria.  Cough suppressants. These may be prescribed for relief of cough symptoms.   Inhaled medicines. These may be prescribed to help open your airways and make it easier for you to breathe.   Steroid medicines. These may be prescribed for those with recurrent (chronic) bronchitis. HOME CARE INSTRUCTIONS  Get plenty of rest.   Drink enough fluids to keep your urine clear or pale yellow (unless you have a medical condition that requires fluid restriction). Increasing fluids may help thin your secretions and will prevent dehydration.   Only take over-the-counter or prescription medicines as directed by your health care provider.  Only take antibiotics as directed. Make sure you finish them even if you start to feel better.  Avoid secondhand smoke, irritating chemicals, and strong fumes. These will make bronchitis  worse. If you are a smoker, quit smoking. Consider using nicotine gum or skin patches to help control withdrawal symptoms. Quitting smoking will help your lungs heal faster.   Put a cool-mist humidifier in your bedroom at night to moisten the air. This may help loosen mucus. Change the water in the humidifier daily. You can also run the hot water in your shower and sit in the bathroom with the door closed for 5-10 minutes.   Follow up with your health care provider as directed.   Wash your hands frequently to avoid catching bronchitis again or spreading an infection to others.  SEEK MEDICAL CARE IF: Your symptoms do not improve after 1 week of treatment.  SEEK IMMEDIATE MEDICAL CARE IF:  Your fever increases.  You have chills.   You have chest pain.   You have worsening shortness of breath.   You have bloody sputum.  You faint.  You have lightheadedness.  You have a severe headache.   You vomit repeatedly. MAKE SURE YOU:   Understand these instructions.  Will watch your condition.  Will get help right away if you are not doing well or get worse. Document Released: 12/29/2004 Document Revised: 10/19/2012 Document Reviewed: 08/23/2012 [facility] Patient Information 2015 [place], [place]. This information is not intended to replace advice given to you by your health care provider. Make sure you discuss any questions you have with your health care provider.

## 2013-08-02 NOTE — ED Provider Notes (Signed)
Medical screening examination/treatment/procedure(s) were performed by non-physician practitioner and as supervising physician I was immediately available for consultation/collaboration.   Dorie Rank, MD 08/02/13 [phone]

## 2013-08-16 ENCOUNTER — Encounter: Payer: Medicaid Other | Admitting: Internal Medicine

## 2013-08-22 ENCOUNTER — Telehealth: Payer: Self-pay | Admitting: *Deleted

## 2013-08-22 NOTE — Telephone Encounter (Addendum)
Call to [facility] for Prior Authorization for Fenofibrate 48 mg tablets.  Information given. To be sent for pharmacy review.  Answer to come within 48-72 hours.  Reference number is [id number].  Pt may need to try Gemfibrozil first.  Sander Nephew, RN 08/22/2013 9:50 AM.

## 2013-08-27 IMAGING — CR DG CHEST 1V PORT
1 series · 1 of 1 positions shown · non-contrast
Comparison: 04/02/2010

CLINICAL DATA: COPD.  Hypertension.  Diabetes.  Smoker.  Peripheral
vascular disease.

PORTABLE CHEST - 1 VIEW

[AP]
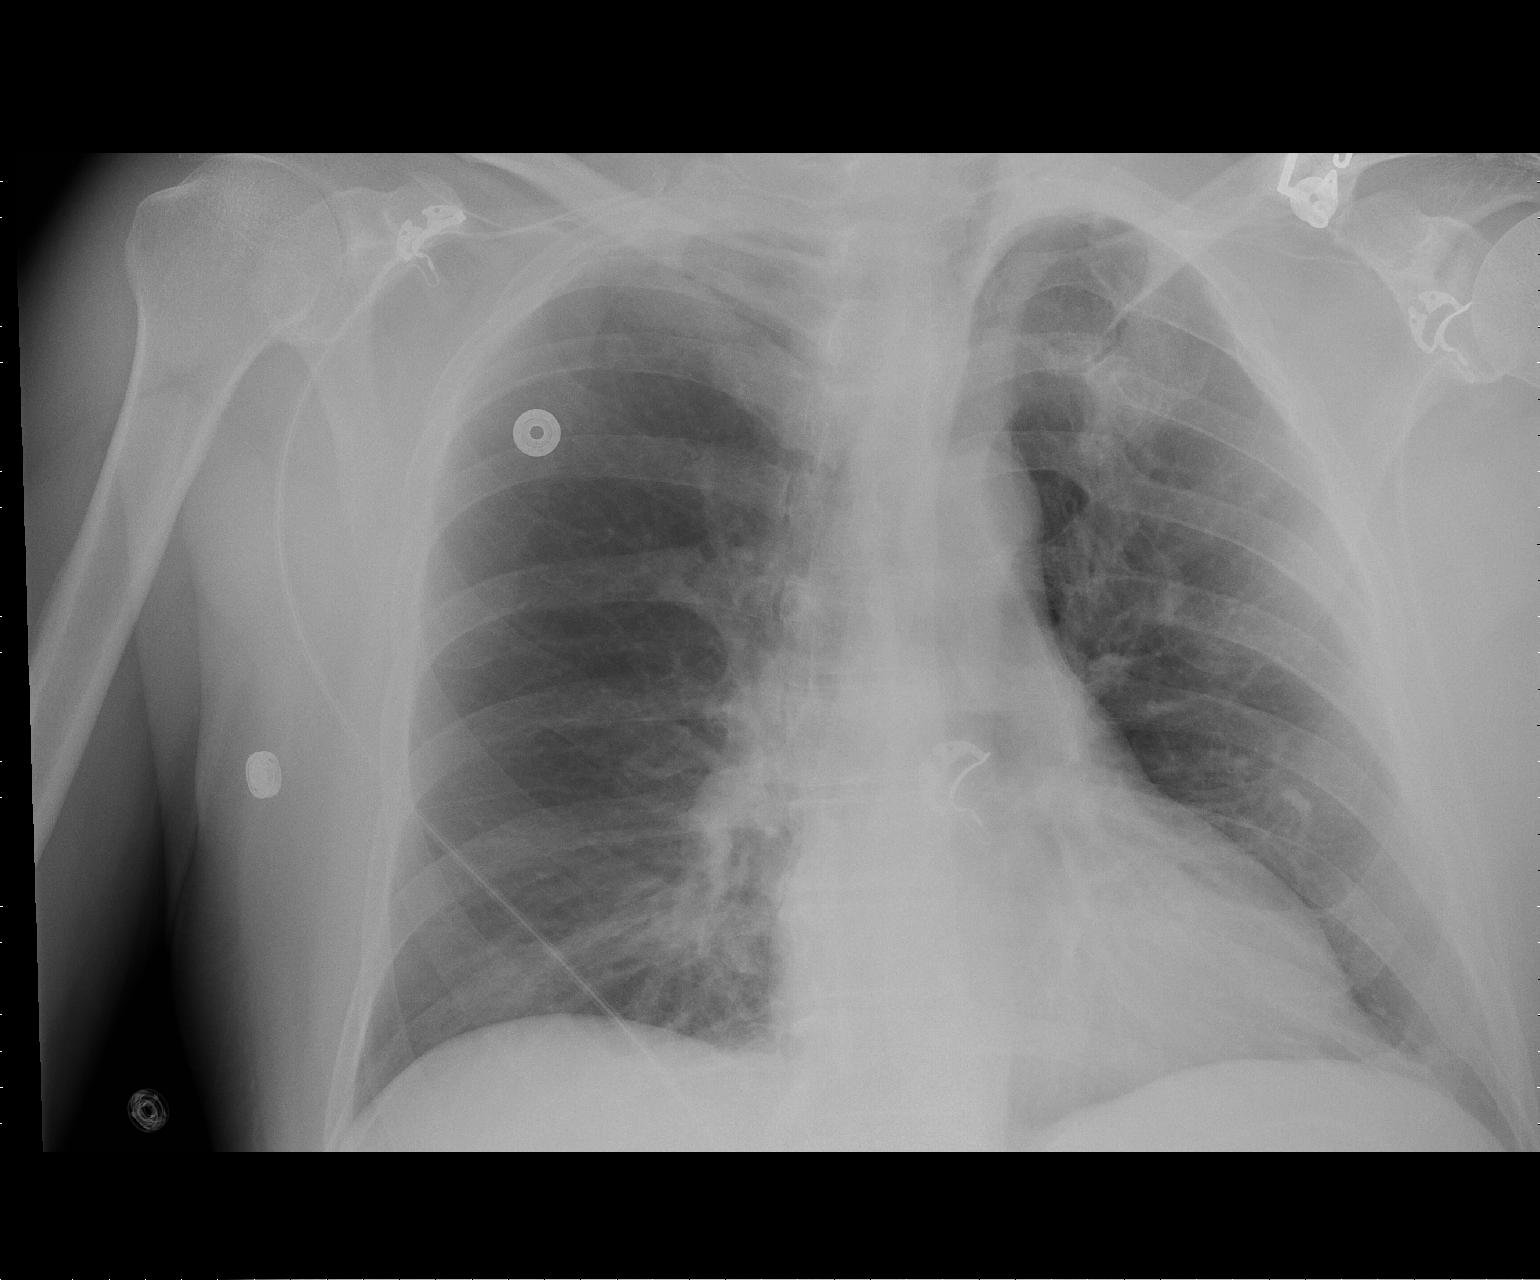

[1 of 1 positions shown; findings below may reference images not displayed]

FINDINGS: Shallow inspiration.  Probable fibrosis in the left upper
lung.  Calcified granuloma in the left midlung.  No focal airspace
consolidation.  No blunting of costophrenic angles.  No
pneumothorax.  Old left rib fractures.  Postoperative changes in
the cervical spine.  No significant change since previous study.
IMPRESSION: No evidence of active pulmonary disease.

## 2013-08-27 IMAGING — CR DG ANG/EXT/UNI/OR RIGHT
1 series · 1 of 1 positions shown · non-contrast
Comparison: None.

CLINICAL DATA: Intraoperative angiogram

RIGHT SAFIATOU/EXT/UNI/ OR

[view not recorded]
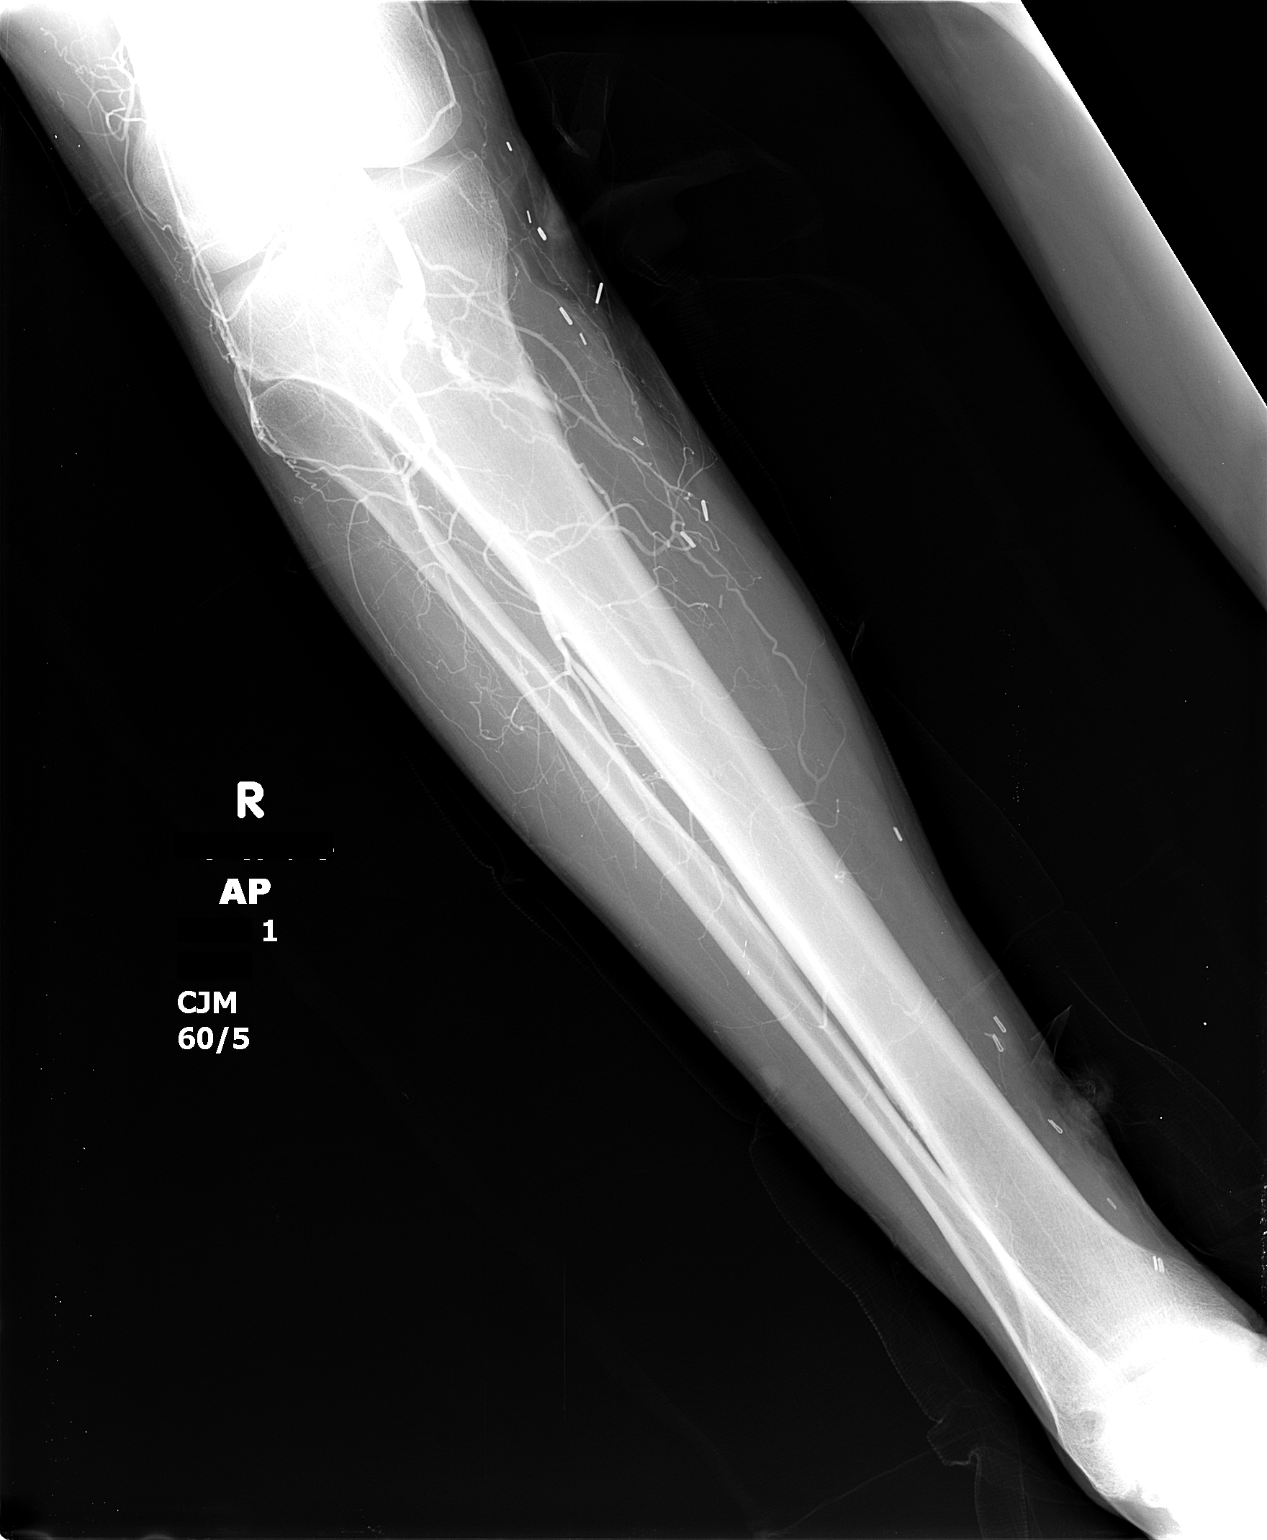

[1 of 1 positions shown; findings below may reference images not displayed]

FINDINGS: Contrast fills a femoral popliteal bypass graft below the
knee.  The distal anastomosis is patent.  There is two-vessel
runoff via the anterior tibial and peroneal arteries.  A small
amount of extravasated contrast at the anastomosis is noted.
IMPRESSION: Intraoperative angiogram.

## 2013-08-29 ENCOUNTER — Encounter: Payer: Self-pay | Admitting: Internal Medicine

## 2013-08-29 ENCOUNTER — Telehealth: Payer: Self-pay | Admitting: Internal Medicine

## 2013-08-29 NOTE — Telephone Encounter (Signed)
Attempted to contact patient this afternoon by phone at telephone number [phone], but no answer.  Left detailed message letting the patient know that he missed his appt on 08-16-13 and his needs to be rescheduled asap to follow up with his diabetes.  He will also be mailed a letter asking him to call back and schedule an appt.

## 2013-09-13 ENCOUNTER — Encounter: Payer: Self-pay | Admitting: Internal Medicine

## 2013-09-13 ENCOUNTER — Ambulatory Visit (INDEPENDENT_AMBULATORY_CARE_PROVIDER_SITE_OTHER): Payer: Medicaid Other | Admitting: Internal Medicine

## 2013-09-13 VITALS — BP 99/48 | HR 81 | Temp 97.8°F | Wt 142.4 lb

## 2013-09-13 DIAGNOSIS — Z720 Tobacco use: Secondary | ICD-10-CM

## 2013-09-13 DIAGNOSIS — J449 Chronic obstructive pulmonary disease, unspecified: Secondary | ICD-10-CM

## 2013-09-13 DIAGNOSIS — J441 Chronic obstructive pulmonary disease with (acute) exacerbation: Secondary | ICD-10-CM

## 2013-09-13 DIAGNOSIS — I10 Essential (primary) hypertension: Secondary | ICD-10-CM

## 2013-09-13 DIAGNOSIS — R42 Dizziness and giddiness: Secondary | ICD-10-CM

## 2013-09-13 DIAGNOSIS — R634 Abnormal weight loss: Secondary | ICD-10-CM

## 2013-09-13 DIAGNOSIS — F17200 Nicotine dependence, unspecified, uncomplicated: Secondary | ICD-10-CM

## 2013-09-13 DIAGNOSIS — J4489 Other specified chronic obstructive pulmonary disease: Secondary | ICD-10-CM

## 2013-09-13 DIAGNOSIS — E119 Type 2 diabetes mellitus without complications: Secondary | ICD-10-CM

## 2013-09-13 LAB — POCT GLYCOSYLATED HEMOGLOBIN (HGB A1C): Hemoglobin A1C: 5.5

## 2013-09-13 LAB — GLUCOSE, CAPILLARY: GLUCOSE-CAPILLARY: 117 mg/dL — AB (ref 70–99)

## 2013-09-13 NOTE — Patient Instructions (Addendum)
You will need to get colonoscopy. Also we will get a CT scan of your chest for lung cancer screening. We will check for hepatitis and HIV infection which can cause weight loss.  Please stop taking your blood pressure medicines.  Drink at least 6-8 glasses of water daily.  Your diabetes is well under control.

## 2013-09-13 NOTE — Progress Notes (Signed)
Subjective:    Patient ID: [name], male    DOB: 12-Apr-1960, 53 y.o.   MRN: [id number]  Dizziness Associated symptoms include coughing. Pertinent negatives include no chills, congestion, fatigue, fever, headaches, numbness, sore throat or weakness.    53 yo male smoker with CAD, L ICA stenosis, COPD, HTN, PVD comes in with dizziness and weight loss. He has lost a total of 58 pounds since 03/16/2011 (200lb) today 142 lbs without trying. His appetite is low. He has COPD, smokes 1.5 pack a day since he was 53 year old. Has occasional coughs. Has SOB with walking short discharges. Denies hemopytisis, hematochezia, abdominal pain, stool color changes. He states he was sent to colonoscopy in the past but GI told him he cannot do colonoscopy since he is on plavix+asa for his CAD and carotid stenosis.   He has been dizzy lately, fell at home few times without LOC, last one few weeks ago, didn't come to ED. He only drinks 1-2 glasses of water in a week, mostly drinks sodas.   He denies any weakness, numbness, tingling, n/v, fever, chills, chest pain, headache.  Family hx positive for mother having stomach cancer. No other family hx of medical problems that patient could tell. His last TSH 2011 was normal.  He was Hep C negative but Hep B core ab was positive, Hep B surface antigen and anti Hep B Surface antibody negative. HIV Negative.  Last CXR was negative on July 2015.   Review of Systems  Constitutional: Positive for unexpected weight change. Negative for fever, chills and fatigue.  HENT: Negative for congestion, dental problem, ear discharge, ear pain, facial swelling, hearing loss, nosebleeds, postnasal drip, rhinorrhea, sore throat and voice change.   Eyes: Negative.   Respiratory: Positive for cough and shortness of breath. Negative for apnea, chest tightness, wheezing and stridor.   Cardiovascular: Negative.   Gastrointestinal: Negative.   Endocrine: Negative.   Genitourinary:  Negative.   Musculoskeletal: Negative.   Skin: Negative.   Allergic/Immunologic: Negative.   Neurological: Positive for dizziness. Negative for tremors, seizures, syncope, speech difficulty, weakness, light-headedness, numbness and headaches.  Hematological: Negative.   Psychiatric/Behavioral: Negative.        Objective:   Physical Exam  Constitutional: He is oriented to person, place, and time. He appears well-developed. He appears cachectic. No distress.  HENT:  Head: Normocephalic and atraumatic.  Right Ear: External ear normal.  Left Ear: External ear normal.  Nose: Nose normal.  Mouth/Throat: Oropharynx is clear and moist.  Eyes: Conjunctivae and EOM are normal. Pupils are equal, round, and reactive to light. Right eye exhibits no discharge. Left eye exhibits no discharge. No scleral icterus.  Neck: Normal range of motion. Neck supple. No JVD present. No thyromegaly present.  Cardiovascular: Normal rate, regular rhythm, S1 normal, S2 normal, normal heart sounds and intact distal pulses.  Exam reveals no gallop and no friction rub.   No murmur heard. Pulmonary/Chest: Effort normal and breath sounds normal. No accessory muscle usage. Not tachypneic. No respiratory distress. He has no rales. He exhibits no tenderness.  Low air movement. Mild exp wheezing diffusely.  Abdominal: Soft. Bowel sounds are normal. He exhibits no distension and no mass. There is no tenderness. There is no rebound and no guarding.  Musculoskeletal: Normal range of motion. He exhibits no edema and no tenderness.  Lymphadenopathy:    He has no cervical adenopathy.  Neurological: He is alert and oriented to person, place, and time. He has normal  strength and normal reflexes. No cranial nerve deficit or sensory deficit.  Skin: Skin is warm and dry. No rash noted. He is not diaphoretic. No erythema. No pallor.  Psychiatric: He has a normal mood and affect. His behavior is normal.    Filed Vitals:   09/13/13  1400  BP: 99/48  Pulse: 81  Temp: 97.8 F (36.6 C)   Orthostatic positive.       Assessment & Plan:   See problem based a&p.

## 2013-09-13 NOTE — Assessment & Plan Note (Signed)
Patient is orthostatic positive. He only drinks 1-2 glasses of water weekly and only drinks sodas whole day.   Asked him to drink at least 6-8 glasses of water. Also stopped all anti-hypertensive meds.

## 2013-09-13 NOTE — Progress Notes (Signed)
SATURATION QUALIFICATIONS: (This note is used to comply with regulatory documentation for home oxygen)  Patient Saturations on Room Air at Rest = 94%  Patient Saturations on Room Air while Ambulating = 85%  Please briefly explain why patient needs home oxygen: see attached progress note

## 2013-09-13 NOTE — Assessment & Plan Note (Signed)
Lost about 58 lb in last 2.5 year without trying. Appetite is low. DDX is broad including hyperthyroidism (but pulse and BP is not high  - no other hyperthyroidism hx either), cancer, malnutrition, malabsorption (no diarrhea or bloating), HIV, Hepatitis, etc. Weight loss could also be from COPD.   He was Hep C negative but Hep B core ab was positive, Hep B surface antigen and anti Hep B Surface antibody negative. HIV Negative.  Will check anti hep B core antibody, hep B core antigen, Hep B Surface antinen, anti Hep B surface antibody, Hep C, HIV.  Also will do lung cancer screening CT scan (meets screening criteria based on age and smoking hx).  Ordered colonoscopy referral again. guaiac card given for home.

## 2013-09-13 NOTE — Assessment & Plan Note (Signed)
Well controlled. hgbA1c 5.5, CBG 117 today. Continue metformin.

## 2013-09-13 NOTE — Assessment & Plan Note (Signed)
His BP today is 99/88 P 81 and he is orthostatic positive. Will stop his anti-hypertensives today (ACEI and betablocker). Even though he has CAD and PVD, his risk of falling with BP running low and causing dizziness may be more harmful than controlling his BP too aggressively. Will restart later if his BP starts to go up a lot.

## 2013-09-13 NOTE — Assessment & Plan Note (Signed)
Has occasional cough. Has SOB with walking. Checked ambulatory O2, was 85% with ambulation without any symptom. Was satting 93% at rest.  Referred to [facility] for home O2.  Will need to obtain PFT, not sure when last one was done. Might order next time as he already has orders for many other things currently this visit.

## 2013-09-14 LAB — HEPATITIS B SURFACE ANTIGEN: HEP B S AG: NEGATIVE

## 2013-09-14 LAB — HEPATITIS C ANTIBODY: HCV Ab: REACTIVE — AB

## 2013-09-14 LAB — HEPATITIS B SURFACE ANTIBODY,QUALITATIVE: Hep B S Ab: POSITIVE — AB

## 2013-09-14 LAB — HEPATITIS B CORE ANTIBODY, TOTAL: Hep B Core Total Ab: REACTIVE — AB

## 2013-09-14 NOTE — Progress Notes (Signed)
Quick Note:  Dr. Lynnae January, would you please take a look at this results? I am not quite sure what to make of the Hep B panel. It seems like either he could be chronically infected, or immune from natural infection?  Also, he is HCV reactive again, I should be probably ordered the HCV RNA instead? ______

## 2013-09-14 NOTE — Addendum Note (Signed)
Addended by: Hulan Fray on: 09/14/2013 04:57 PM   Modules accepted: Orders

## 2013-09-15 LAB — HIV-1 RNA ULTRAQUANT REFLEX TO GENTYP+: HIV 1 RNA Quant: <20 copies/mL (ref <20)

## 2013-09-19 ENCOUNTER — Encounter: Payer: Self-pay | Admitting: Nurse Practitioner

## 2013-09-19 LAB — HEPATITIS B E ANTIBODY: HEPATITIS BE ANTIBODY: NONREACTIVE

## 2013-09-22 ENCOUNTER — Emergency Department (HOSPITAL_COMMUNITY)
Admission: EM | Admit: 2013-09-22 | Discharge: 2013-09-22 | Disposition: A | Discharge status: Home / Self Care | Payer: Medicaid Other | Attending: Emergency Medicine | Admitting: Emergency Medicine

## 2013-09-22 ENCOUNTER — Encounter (HOSPITAL_COMMUNITY): Payer: Self-pay | Admitting: Emergency Medicine

## 2013-09-22 ENCOUNTER — Emergency Department (HOSPITAL_COMMUNITY)
Admission: EM | Admit: 2013-09-22 | Discharge: 2013-09-22 | Discharge status: Against Medical Advice | Payer: Medicaid Other | Attending: Emergency Medicine | Admitting: Emergency Medicine

## 2013-09-22 ENCOUNTER — Emergency Department (HOSPITAL_COMMUNITY): Payer: Medicaid Other

## 2013-09-22 DIAGNOSIS — F411 Generalized anxiety disorder: Secondary | ICD-10-CM | POA: Insufficient documentation

## 2013-09-22 DIAGNOSIS — Z7902 Long term (current) use of antithrombotics/antiplatelets: Secondary | ICD-10-CM | POA: Diagnosis not present

## 2013-09-22 DIAGNOSIS — J441 Chronic obstructive pulmonary disease with (acute) exacerbation: Secondary | ICD-10-CM | POA: Diagnosis not present

## 2013-09-22 DIAGNOSIS — R1033 Periumbilical pain: Secondary | ICD-10-CM | POA: Diagnosis present

## 2013-09-22 DIAGNOSIS — Z8614 Personal history of Methicillin resistant Staphylococcus aureus infection: Secondary | ICD-10-CM | POA: Insufficient documentation

## 2013-09-22 DIAGNOSIS — Z7982 Long term (current) use of aspirin: Secondary | ICD-10-CM | POA: Diagnosis not present

## 2013-09-22 DIAGNOSIS — I10 Essential (primary) hypertension: Secondary | ICD-10-CM | POA: Insufficient documentation

## 2013-09-22 DIAGNOSIS — F17200 Nicotine dependence, unspecified, uncomplicated: Secondary | ICD-10-CM | POA: Diagnosis not present

## 2013-09-22 DIAGNOSIS — Z201 Contact with and (suspected) exposure to tuberculosis: Secondary | ICD-10-CM | POA: Diagnosis not present

## 2013-09-22 DIAGNOSIS — Z87828 Personal history of other (healed) physical injury and trauma: Secondary | ICD-10-CM | POA: Insufficient documentation

## 2013-09-22 DIAGNOSIS — E119 Type 2 diabetes mellitus without complications: Secondary | ICD-10-CM | POA: Insufficient documentation

## 2013-09-22 DIAGNOSIS — Z792 Long term (current) use of antibiotics: Secondary | ICD-10-CM | POA: Insufficient documentation

## 2013-09-22 DIAGNOSIS — R0602 Shortness of breath: Secondary | ICD-10-CM

## 2013-09-22 DIAGNOSIS — IMO0002 Reserved for concepts with insufficient information to code with codable children: Secondary | ICD-10-CM | POA: Insufficient documentation

## 2013-09-22 DIAGNOSIS — Z951 Presence of aortocoronary bypass graft: Secondary | ICD-10-CM | POA: Insufficient documentation

## 2013-09-22 DIAGNOSIS — K219 Gastro-esophageal reflux disease without esophagitis: Secondary | ICD-10-CM | POA: Insufficient documentation

## 2013-09-22 DIAGNOSIS — Z8619 Personal history of other infectious and parasitic diseases: Secondary | ICD-10-CM | POA: Diagnosis not present

## 2013-09-22 DIAGNOSIS — Z791 Long term (current) use of non-steroidal anti-inflammatories (NSAID): Secondary | ICD-10-CM | POA: Diagnosis not present

## 2013-09-22 DIAGNOSIS — Z87448 Personal history of other diseases of urinary system: Secondary | ICD-10-CM | POA: Diagnosis not present

## 2013-09-22 DIAGNOSIS — Z79899 Other long term (current) drug therapy: Secondary | ICD-10-CM | POA: Insufficient documentation

## 2013-09-22 DIAGNOSIS — G894 Chronic pain syndrome: Secondary | ICD-10-CM | POA: Insufficient documentation

## 2013-09-22 DIAGNOSIS — Z8673 Personal history of transient ischemic attack (TIA), and cerebral infarction without residual deficits: Secondary | ICD-10-CM | POA: Diagnosis not present

## 2013-09-22 DIAGNOSIS — J40 Bronchitis, not specified as acute or chronic: Secondary | ICD-10-CM

## 2013-09-22 DIAGNOSIS — Z85810 Personal history of malignant neoplasm of tongue: Secondary | ICD-10-CM | POA: Insufficient documentation

## 2013-09-22 DIAGNOSIS — Z8679 Personal history of other diseases of the circulatory system: Secondary | ICD-10-CM | POA: Insufficient documentation

## 2013-09-22 LAB — CBC
HCT: 39 % (ref 39.0–52.0)
HEMOGLOBIN: 13 g/dL (ref 13.0–17.0)
MCH: 29.3 pg (ref 26.0–34.0)
MCHC: 33.3 g/dL (ref 30.0–36.0)
MCV: 88 fL (ref 78.0–100.0)
Platelets: 278 10*3/uL (ref 150–400)
RBC: 4.43 MIL/uL (ref 4.22–5.81)
RDW: 14.3 % (ref 11.5–15.5)
WBC: 6 10*3/uL (ref 4.0–10.5)

## 2013-09-22 LAB — COMPREHENSIVE METABOLIC PANEL
ALK PHOS: 78 U/L (ref 39–117)
ALT: 18 U/L (ref 0–53)
AST: 25 U/L (ref 0–37)
Albumin: 3.5 g/dL (ref 3.5–5.2)
Anion gap: 10 (ref 5–15)
BUN: 11 mg/dL (ref 6–23)
CO2: 29 meq/L (ref 19–32)
Calcium: 9 mg/dL (ref 8.4–10.5)
Chloride: 98 mEq/L (ref 96–112)
Creatinine, Ser: 0.68 mg/dL (ref 0.50–1.35)
GFR calc Af Amer: >90 mL/min (ref >90)
GFR calc non Af Amer: >90 mL/min (ref >90)
Glucose, Bld: 110 mg/dL — ABNORMAL HIGH (ref 70–99)
Potassium: 4.2 mEq/L (ref 3.7–5.3)
SODIUM: 137 meq/L (ref 137–147)
TOTAL PROTEIN: 8.3 g/dL (ref 6.0–8.3)
Total Bilirubin: 0.4 mg/dL (ref 0.3–1.2)

## 2013-09-22 LAB — BASIC METABOLIC PANEL
Anion gap: 12 (ref 5–15)
BUN: 11 mg/dL (ref 6–23)
CALCIUM: 8.8 mg/dL (ref 8.4–10.5)
CO2: 27 mEq/L (ref 19–32)
CREATININE: 0.73 mg/dL (ref 0.50–1.35)
Chloride: 99 mEq/L (ref 96–112)
Glucose, Bld: 122 mg/dL — ABNORMAL HIGH (ref 70–99)
Potassium: 4.3 mEq/L (ref 3.7–5.3)
Sodium: 138 mEq/L (ref 137–147)

## 2013-09-22 LAB — PROTIME-INR
INR: 1.14 (ref 0.00–1.49)
PROTHROMBIN TIME: 14.6 s (ref 11.6–15.2)

## 2013-09-22 LAB — TROPONIN I: Troponin I: <0.3 ng/mL (ref <0.30)

## 2013-09-22 LAB — I-STAT ARTERIAL BLOOD GAS, ED
Acid-Base Excess: 4 mmol/L — ABNORMAL HIGH (ref 0.0–2.0)
BICARBONATE: 31.3 meq/L — AB (ref 20.0–24.0)
O2 Saturation: 100 %
PCO2 ART: 55.4 mmHg — AB (ref 35.0–45.0)
PO2 ART: 211 mmHg — AB (ref 80.0–100.0)
Patient temperature: 98.6
TCO2: 33 mmol/L (ref 0–100)
pH, Arterial: 7.36 (ref 7.350–7.450)

## 2013-09-22 LAB — I-STAT TROPONIN, ED: Troponin i, poc: 0 ng/mL (ref 0.00–0.08)

## 2013-09-22 LAB — PRO B NATRIURETIC PEPTIDE

## 2013-09-22 LAB — APTT: aPTT: 40 seconds — ABNORMAL HIGH (ref 24–37)

## 2013-09-22 MED ORDER — ASPIRIN 81 MG PO CHEW: 324.0000 mg | CHEWABLE_TABLET | Freq: Once | ORAL | Status: DC

## 2013-09-22 MED ORDER — ALBUTEROL SULFATE (2.5 MG/3ML) 0.083% IN NEBU
5.0000 mg | INHALATION_SOLUTION | Freq: Once | RESPIRATORY_TRACT | Status: AC
  Administered 2013-09-22: 5 mg via RESPIRATORY_TRACT
  Filled 2013-09-22: qty 6

## 2013-09-22 MED ORDER — SODIUM CHLORIDE 0.9 % IV SOLN
INTRAVENOUS | Status: DC
  Administered 2013-09-22: 11:00:00 via INTRAVENOUS

## 2013-09-22 MED ORDER — PREDNISONE 20 MG PO TABS
40.0000 mg | ORAL_TABLET | Freq: Once | ORAL | Status: AC
  Administered 2013-09-22: 40 mg via ORAL
  Filled 2013-09-22: qty 2

## 2013-09-22 MED ORDER — LORAZEPAM 1 MG PO TABS
1.0000 mg | ORAL_TABLET | Freq: Once | ORAL | Status: AC
  Administered 2013-09-22: 1 mg via ORAL
  Filled 2013-09-22: qty 1

## 2013-09-22 MED ORDER — PREDNISONE 20 MG PO TABS: 40.0000 mg | ORAL_TABLET | Freq: Every day | ORAL | Status: DC

## 2013-09-22 MED ORDER — ALBUTEROL SULFATE HFA 108 (90 BASE) MCG/ACT IN AERS
2.0000 | INHALATION_SPRAY | Freq: Once | RESPIRATORY_TRACT | Status: AC
  Administered 2013-09-22: 2 via RESPIRATORY_TRACT
  Filled 2013-09-22: qty 6.7

## 2013-09-22 MED ORDER — IPRATROPIUM BROMIDE 0.02 % IN SOLN
0.5000 mg | Freq: Once | RESPIRATORY_TRACT | Status: AC
  Administered 2013-09-22: 0.5 mg via RESPIRATORY_TRACT
  Filled 2013-09-22: qty 2.5

## 2013-09-22 MED ORDER — LORAZEPAM 2 MG/ML IJ SOLN: 0.5000 mg | Freq: Once | INTRAMUSCULAR | Status: DC

## 2013-09-22 MED ORDER — ASPIRIN 81 MG PO CHEW
324.0000 mg | CHEWABLE_TABLET | Freq: Once | ORAL | Status: AC
  Administered 2013-09-22: 324 mg via ORAL
  Filled 2013-09-22: qty 4

## 2013-09-22 MED ORDER — LORAZEPAM 1 MG PO TABS
0.5000 mg | ORAL_TABLET | Freq: Once | ORAL | Status: AC
  Administered 2013-09-22: 0.5 mg via ORAL
  Filled 2013-09-22: qty 1

## 2013-09-22 MED ORDER — HEPARIN SODIUM (PORCINE) 5000 UNIT/ML IJ SOLN
60.0000 [IU]/kg | INTRAMUSCULAR | Status: DC
  Filled 2013-09-22: qty 1

## 2013-09-22 MED ORDER — ALBUTEROL SULFATE (2.5 MG/3ML) 0.083% IN NEBU: 2.5000 mg | INHALATION_SOLUTION | Freq: Four times a day (QID) | RESPIRATORY_TRACT | Status: DC | PRN

## 2013-09-22 NOTE — ED Provider Notes (Signed)
Medical screening examination/treatment/procedure(s) were performed by non-physician practitioner and as supervising physician I was immediately available for consultation/collaboration.   EKG Interpretation None        Hoy Morn, MD 09/22/13 0800

## 2013-09-22 NOTE — ED Notes (Signed)
Pt placed on 02 at 2 l [place]

## 2013-09-22 NOTE — ED Notes (Signed)
Pt. reports SOB / panic attack onset this morning , denies cough or congestion , O2 sat = 98% room air at arrival .
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
## 2013-09-22 NOTE — ED Notes (Signed)
Pt returns  To er stating he felt better after he left and then he felt worse again w/  Sob  Had cp yesterday family at bedside states  And then that went away

## 2013-09-22 NOTE — ED Notes (Signed)
Per pt sts SOB and upper back pain since this am. Pt seen here this am. Code stemi called per Dr. Tomi Bamberger.
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
## 2013-09-22 NOTE — ED Notes (Signed)
RT at bedside.

## 2013-09-22 NOTE — ED Notes (Signed)
Pt agitated, cursing at staff, removed EKG leads and states is "going to [facility] spoke with pt and explained process, need to stay for evaluation and treatment, monitor leads replaced, pt calm and agrees to stay.

## 2013-09-22 NOTE — ED Notes (Signed)
MD Kohut at bedside to discuss discharge instructions and diagnosis

## 2013-09-22 NOTE — ED Notes (Signed)
RT to bedside

## 2013-09-22 NOTE — ED Notes (Signed)
Pt was seen leaving ER w/ family ,had taken most of breathing tx. Pt did not tell anyone he was leaving

## 2013-09-22 NOTE — ED Notes (Signed)
Pt discharged home with all belongings, pt alert, oriented and ambulatory upon discharge. Pt escorted to exit by Cruzita Lederer via wheelchair. 3 new RX prescribed, pt verbalizes understanding of discharge instructions. Pt driven home by spouse

## 2013-09-25 ENCOUNTER — Ambulatory Visit (INDEPENDENT_AMBULATORY_CARE_PROVIDER_SITE_OTHER): Payer: Medicaid Other | Admitting: Internal Medicine

## 2013-09-25 ENCOUNTER — Encounter: Payer: Self-pay | Admitting: Internal Medicine

## 2013-09-25 VITALS — BP 135/70 | HR 77 | Temp 98.0°F | Ht 73.0 in | Wt 140.5 lb

## 2013-09-25 DIAGNOSIS — F411 Generalized anxiety disorder: Secondary | ICD-10-CM

## 2013-09-25 DIAGNOSIS — J449 Chronic obstructive pulmonary disease, unspecified: Secondary | ICD-10-CM

## 2013-09-25 DIAGNOSIS — R634 Abnormal weight loss: Secondary | ICD-10-CM

## 2013-09-25 LAB — POC HEMOCCULT BLD/STL (HOME/3-CARD/SCREEN)
FECAL OCCULT BLD: NEGATIVE
FECAL OCCULT BLD: NEGATIVE
FECAL OCCULT BLD: NEGATIVE

## 2013-09-25 LAB — GLUCOSE, CAPILLARY: Glucose-Capillary: 88 mg/dL (ref 70–99)

## 2013-09-25 MED ORDER — CLONAZEPAM 0.5 MG PO TABS: 0.2500 mg | ORAL_TABLET | Freq: Two times a day (BID) | ORAL | Status: DC | PRN

## 2013-09-25 MED ORDER — ALBUTEROL SULFATE (2.5 MG/3ML) 0.083% IN NEBU: 2.5000 mg | INHALATION_SOLUTION | Freq: Four times a day (QID) | RESPIRATORY_TRACT | Status: DC | PRN

## 2013-09-25 NOTE — Assessment & Plan Note (Signed)
Scheduled to get lung cancer screening CT 09/29/13. Has cscope on 09/27/13.

## 2013-09-25 NOTE — Patient Instructions (Addendum)
We will work on getting you home oxygen supply. Use your nebulizer as needed. When you are short of breathe, first use your nebulizer. If it doesn't help then take klonopin to calm yourself down and see if it helps. Follow up after your CT of lung in 1 week.

## 2013-09-25 NOTE — Assessment & Plan Note (Signed)
Has cough, SOB, occasional wheezing. No wheezing on exam today.   Is finishing up prednisone 5 day course without improvement. Filled his albuterol nebs today and he got nebulizer teaching done today.   [facility] to [facility] today to make sure he gets home O2 as he desatted to 85% with ambulation on last visit.  Asked him to try klonopin 0.25mg  BID if his SOB doesn't improve with nebulizer. He may have anxiety or panic disorder which is causing his dyspnea.   Has CT lung cancer screening on 09/29/13. Continues to smoke despite symptoms.

## 2013-09-25 NOTE — Progress Notes (Signed)
   Subjective:    Patient ID: [name], male    DOB: 09-29-1960, 53 y.o.   MRN: [id number]  Shortness of Breath Associated symptoms include wheezing. Pertinent negatives include no chest pain, ear pain, fever, headaches, leg swelling, neck pain, rhinorrhea or sore throat.    Comes here for SOB, feels as if someone is smothering him. Was in the ED for this. Was started on prednisone 40mg  for 5 days. Has been taking that without any improvement of SOB. Was also prescribed albuterol nebs but hasn't been abel to get that yet. Didn't get his home o2 yet which was ordered because of 85% sat with ambulation previously. Had significant weight loss and is scheduled to get CT chest appt for lung cancer screening on 09/29/13 and cscope on 09/27/13.   Has SOB constantly, feels anxious, has some wheezing. Was No other complaints at this time.   Review of Systems  Constitutional: Positive for unexpected weight change. Negative for fever, chills, activity change, appetite change and fatigue.  HENT: Negative for congestion, drooling, ear discharge, ear pain, nosebleeds, postnasal drip, rhinorrhea, sinus pressure and sore throat.   Eyes: Negative for pain, discharge and visual disturbance.  Respiratory: Positive for cough, shortness of breath and wheezing. Negative for chest tightness.   Cardiovascular: Negative for chest pain, palpitations and leg swelling.  Gastrointestinal: Negative for nausea, diarrhea, constipation, abdominal distention and anal bleeding.  Endocrine: Negative.   Genitourinary: Negative for dysuria, urgency, hematuria, decreased urine volume and difficulty urinating.  Musculoskeletal: Negative for arthralgias, back pain, joint swelling, neck pain and neck stiffness.  Skin: Negative.   Allergic/Immunologic: Negative.   Neurological: Negative for dizziness, seizures, syncope, speech difficulty, weakness, light-headedness, numbness and headaches.  Hematological: Negative.     Psychiatric/Behavioral: Negative.        Objective:   Physical Exam  Constitutional: He is oriented to person, place, and time. He appears well-developed and well-nourished. He appears distressed.  HENT:  Head: Normocephalic and atraumatic.  Right Ear: External ear normal.  Left Ear: External ear normal.  Nose: Nose normal.  Mouth/Throat: Oropharynx is clear and moist.  Eyes: Conjunctivae and EOM are normal. Pupils are equal, round, and reactive to light. Right eye exhibits no discharge. Left eye exhibits no discharge. No scleral icterus.  Neck: Normal range of motion. Neck supple. No JVD present. No thyromegaly present.  Cardiovascular: Normal rate, regular rhythm, S1 normal, S2 normal, normal heart sounds and intact distal pulses.  Exam reveals no gallop and no friction rub.   No murmur heard. Pulmonary/Chest: No accessory muscle usage. Not tachypneic and not bradypneic. No respiratory distress. He has decreased breath sounds. He has no wheezes. He has no rales. He exhibits no tenderness.  No accessory muscle use. No wheezing. Low air movement on bases.  Abdominal: Soft. Bowel sounds are normal. He exhibits no distension and no mass. There is no tenderness. There is no rebound and no guarding.  Musculoskeletal: Normal range of motion. He exhibits no edema and no tenderness.  Lymphadenopathy:    He has no cervical adenopathy.  Neurological: He is alert and oriented to person, place, and time. He has normal strength and normal reflexes. No cranial nerve deficit or sensory deficit.  Skin: No rash noted. He is not diaphoretic. No erythema. No pallor.  Psychiatric: He has a normal mood and affect. His behavior is normal.       Assessment & Plan:  See problem based a&p.

## 2013-09-25 NOTE — Assessment & Plan Note (Signed)
Will start 0.25mg  klonopin BID 10 tablets because he seems to be very anxious and in panic mood which can be exacerbating his feeling of dyspnea. Asked him to take it only if his SOB doesn't improve with nebulizer.

## 2013-09-26 ENCOUNTER — Encounter: Payer: Self-pay | Admitting: Internal Medicine

## 2013-09-26 ENCOUNTER — Ambulatory Visit (INDEPENDENT_AMBULATORY_CARE_PROVIDER_SITE_OTHER): Payer: Medicaid Other | Admitting: Internal Medicine

## 2013-09-26 DIAGNOSIS — R0609 Other forms of dyspnea: Secondary | ICD-10-CM

## 2013-09-26 DIAGNOSIS — R0600 Dyspnea, unspecified: Secondary | ICD-10-CM | POA: Insufficient documentation

## 2013-09-26 DIAGNOSIS — J449 Chronic obstructive pulmonary disease, unspecified: Secondary | ICD-10-CM

## 2013-09-26 DIAGNOSIS — R0989 Other specified symptoms and signs involving the circulatory and respiratory systems: Secondary | ICD-10-CM

## 2013-09-26 MED ORDER — FLUTICASONE-SALMETEROL 100-50 MCG/DOSE IN AEPB: 1.0000 | INHALATION_SPRAY | Freq: Two times a day (BID) | RESPIRATORY_TRACT | Status: DC

## 2013-09-26 NOTE — Assessment & Plan Note (Signed)
Patient continues to have dyspnea at rest. Dyspnea makes him anxious and have panic attacks with impending doom. SOB improves with klonopin. He had taken more than we asked (took 0.5 mg tabet 3 times a day) instead of taking 0.25mg  twice daily. Was on albuterol for copd in the past. Didn't require anything else. This may be COPD exacerbation. Is currently on prednisone taper, also recenlty started albuterol inhaler and combivent. Other DDX include PE (panic, SOB), lung malignancy, anxiety attacks.  Was initially scheduled to get lung cancer screening Ct for 54 pound weight loss in last several months. Have called and scheduled for CT angio chest for tomorrow for PE rule out and also for malignancy workup. Has moderate wells score for PE.  Will try advair discus 100-50 inhaler BID. Patient wants more klonopin but didn't prescribe any this time as he took more than instructed.   Few days ago he desatted to 85% with ambulation. But today he satted 96% with ambulation. Does not require oxygen at this point.

## 2013-09-26 NOTE — Progress Notes (Signed)
Pt here sitting O2- 97%.  Pt ambulated O2- 96%.  Resting past walk O2 -96%.  Sander Nephew, RN 09/26/2013 2:00 PM.

## 2013-09-26 NOTE — Addendum Note (Signed)
Addended by: Dellia Nims on: 09/26/2013 04:18 PM   Modules accepted: Orders

## 2013-09-26 NOTE — Progress Notes (Signed)
Subjective:    Patient ID: [name], male    DOB: 1960/04/28, 53 y.o.   MRN: [id number]  HPI 53 yo male with COPD, PVD, GERD here for follow up for SOB. Was seen yesterday for the same thing.  Continues to have SOB that makes him very anxious. Has been using the nebulizers every 6 hours and inhalers (albuterol and combivent) every 4 hours. Sob makes anxiety worse. Feels like he is going to die.Marland Kitchen Has been taking clonazepam from last visit 0.5mg  tablet 3-4 times a day. It helps with the breathing but get SOB again in 3-4 hours. Has some green mucus with cough. No fever/chils. No diarrhea/constipation. No chest pain. No dizziness, headache, vision changes. Has some runny nose. Appetite is better now.   No recent surgery or prolonged resting stage. No hx of blood clots.   Review of Systems  Constitutional: Negative for fever, chills, activity change, appetite change and fatigue.  HENT: Positive for rhinorrhea. Negative for congestion, drooling, ear discharge, ear pain, nosebleeds, postnasal drip, sinus pressure and sore throat.   Eyes: Negative for pain, discharge and visual disturbance.  Respiratory: Positive for cough and shortness of breath. Negative for chest tightness and wheezing.   Cardiovascular: Negative for chest pain, palpitations and leg swelling.  Gastrointestinal: Negative for nausea, diarrhea, constipation, abdominal distention and anal bleeding.  Endocrine: Negative.   Genitourinary: Negative.  Negative for dysuria, urgency, hematuria, decreased urine volume and difficulty urinating.  Musculoskeletal: Negative.  Negative for arthralgias, back pain, joint swelling, neck pain and neck stiffness.  Skin: Negative.   Allergic/Immunologic: Negative.   Neurological: Negative for dizziness, seizures, syncope, speech difficulty, weakness, light-headedness, numbness and headaches.  Hematological: Negative.   Psychiatric/Behavioral: Negative.        Objective:   Physical Exam   Constitutional: He is oriented to person, place, and time. He appears well-developed. He appears distressed.  HENT:  Head: Normocephalic and atraumatic.  Right Ear: External ear normal.  Left Ear: External ear normal.  Nose: Nose normal.  Mouth/Throat: Oropharynx is clear and moist.  Eyes: Conjunctivae and EOM are normal. Pupils are equal, round, and reactive to light. Right eye exhibits no discharge. Left eye exhibits no discharge. No scleral icterus.  Neck: Normal range of motion. Neck supple. No JVD present. No thyromegaly present.  Cardiovascular: Normal rate, regular rhythm, S1 normal, S2 normal, normal heart sounds and intact distal pulses.  Exam reveals no gallop and no friction rub.   No murmur heard. Pulmonary/Chest: Breath sounds normal. No accessory muscle usage. Tachypnea noted. He is in respiratory distress. He has no wheezes. He has no rales. Chest wall is not dull to percussion. He exhibits no tenderness and no swelling.  Abdominal: Soft. Bowel sounds are normal. He exhibits no distension and no mass. There is no tenderness. There is no rebound, no guarding and no CVA tenderness.  Musculoskeletal: Normal range of motion. He exhibits no edema and no tenderness.  Lymphadenopathy:    He has no cervical adenopathy.  Neurological: He is alert and oriented to person, place, and time. He has normal strength and normal reflexes. No cranial nerve deficit or sensory deficit.  Skin: No rash noted. He is not diaphoretic. No erythema. No pallor.  Psychiatric: He has a normal mood and affect. His behavior is normal.     Checked sat with ambulation. Was 96% with ambulation on room air. Doesn't require oxygen based on this.     Assessment & Plan:  See problem based  a&p.

## 2013-09-26 NOTE — Assessment & Plan Note (Signed)
Patient continues to have dyspnea at rest. Dyspnea makes him anxious and have panic attacks with impending doom. SOB improves with klonopin. He had taken more than we asked (took 0.5 mg tabet 3 times a day) instead of taking 0.25mg  twice daily. Was on albuterol for copd in the past. Didn't require anything else. This may be COPD exacerbation. Is currently on prednisone taper, also recenlty started albuterol inhaler and combivent. Other DDX include PE (panic, SOB), lung malignancy, anxiety attacks.  Was initially scheduled to get lung cancer screening Ct for 54 pound weight loss in last several months. Have called and scheduled for CT angio chest for tomorrow for PE rule out and also for malignancy workup. Has moderate wells score for PE.  Will try advair discus 100-50 inhaler BID. Patient wants more klonopin but didn't prescribe any this time as he took more than instructed.

## 2013-09-26 NOTE — Patient Instructions (Signed)
Please take advair 1 inhalation every 12 hours. Use your anxiety medication only as needed.  We will call you about your CT scan tomorrow. Cancel your colonoscopy appointment.

## 2013-09-27 ENCOUNTER — Telehealth: Payer: Self-pay | Admitting: Internal Medicine

## 2013-09-27 ENCOUNTER — Encounter (HOSPITAL_COMMUNITY): Payer: Self-pay

## 2013-09-27 ENCOUNTER — Ambulatory Visit: Payer: Medicaid Other | Admitting: Internal Medicine

## 2013-09-27 ENCOUNTER — Ambulatory Visit (HOSPITAL_COMMUNITY)
Admission: RE | Admit: 2013-09-27 | Discharge: 2013-09-27 | Disposition: A | Discharge status: Home / Self Care | Payer: Medicaid Other | Source: Ambulatory Visit | Attending: Internal Medicine | Admitting: Internal Medicine

## 2013-09-27 ENCOUNTER — Other Ambulatory Visit: Payer: Self-pay | Admitting: Internal Medicine

## 2013-09-27 DIAGNOSIS — R0609 Other forms of dyspnea: Secondary | ICD-10-CM | POA: Diagnosis present

## 2013-09-27 DIAGNOSIS — J189 Pneumonia, unspecified organism: Secondary | ICD-10-CM

## 2013-09-27 DIAGNOSIS — I2510 Atherosclerotic heart disease of native coronary artery without angina pectoris: Secondary | ICD-10-CM | POA: Insufficient documentation

## 2013-09-27 DIAGNOSIS — R599 Enlarged lymph nodes, unspecified: Secondary | ICD-10-CM | POA: Diagnosis not present

## 2013-09-27 DIAGNOSIS — R0989 Other specified symptoms and signs involving the circulatory and respiratory systems: Secondary | ICD-10-CM | POA: Diagnosis not present

## 2013-09-27 DIAGNOSIS — R0600 Dyspnea, unspecified: Secondary | ICD-10-CM

## 2013-09-27 MED ORDER — IOHEXOL 350 MG/ML SOLN
100.0000 mL | Freq: Once | INTRAVENOUS | Status: AC | PRN
  Administered 2013-09-27: 100 mL via INTRAVENOUS

## 2013-09-27 MED ORDER — AZITHROMYCIN 250 MG PO TABS: ORAL_TABLET | ORAL | Status: DC

## 2013-09-27 NOTE — Progress Notes (Signed)
[facility] Medicine Clinic Attending Date of visit: 09/25/2013  I saw and evaluated the patient.  I personally confirmed the key portions of the history and exam documented by Dr. Genene Churn and I reviewed pertinent patient test results.  The assessment, diagnosis, and plan were formulated together and I agree with the documentation in the resident's note.

## 2013-09-28 ENCOUNTER — Ambulatory Visit: Payer: Medicaid Other | Admitting: Nurse Practitioner

## 2013-09-28 NOTE — Telephone Encounter (Signed)
Called and told him that we found PNA on his ct angio of chest. Will treat with azithromycin 5 days.

## 2013-09-28 NOTE — Addendum Note (Signed)
Addended by: Hulan Fray on: 09/28/2013 02:01 PM   Modules accepted: Orders

## 2013-09-29 ENCOUNTER — Ambulatory Visit (HOSPITAL_COMMUNITY): Payer: Medicaid Other

## 2013-09-29 ENCOUNTER — Telehealth: Payer: Self-pay | Admitting: *Deleted

## 2013-09-29 DIAGNOSIS — F411 Generalized anxiety disorder: Secondary | ICD-10-CM

## 2013-09-29 NOTE — Telephone Encounter (Signed)
Please call in klonopin 0.5mg  tablets, take 0.25 mg (1/2 tablet) every 12 hours PRN anxiety, #5 tablets.  Let patient know that he must see a psychiatrist for anxiety. We cannot keep giving him this medicine. He also can only take 0.25mg  BID. Not more than that.

## 2013-09-29 NOTE — Telephone Encounter (Signed)
Pt called stating he is have another panic attack and is requesting a refill on Clonazepam 0.5 mg take 1/2 tab bid.  He received # 10 on 9/14 but he took 1 tab bid.  He is now out and request only one refill to get him through the weekend.  He states he has pneumonia and is having trouble breathing.  Clonazepam helps.  Pt # [id number]

## 2013-09-29 NOTE — Telephone Encounter (Signed)
Med called in and pt informed

## 2013-10-01 IMAGING — CR DG TIBIA/FIBULA 2V*R*
3 series · 3 of 3 positions shown · non-contrast
Comparison: None.

CLINICAL DATA: Wound infection right tib-fib.  Swelling around the
knee and mid tib-fib.

RIGHT TIBIA AND FIBULA - 2 VIEW

[x tib-fib ap right]
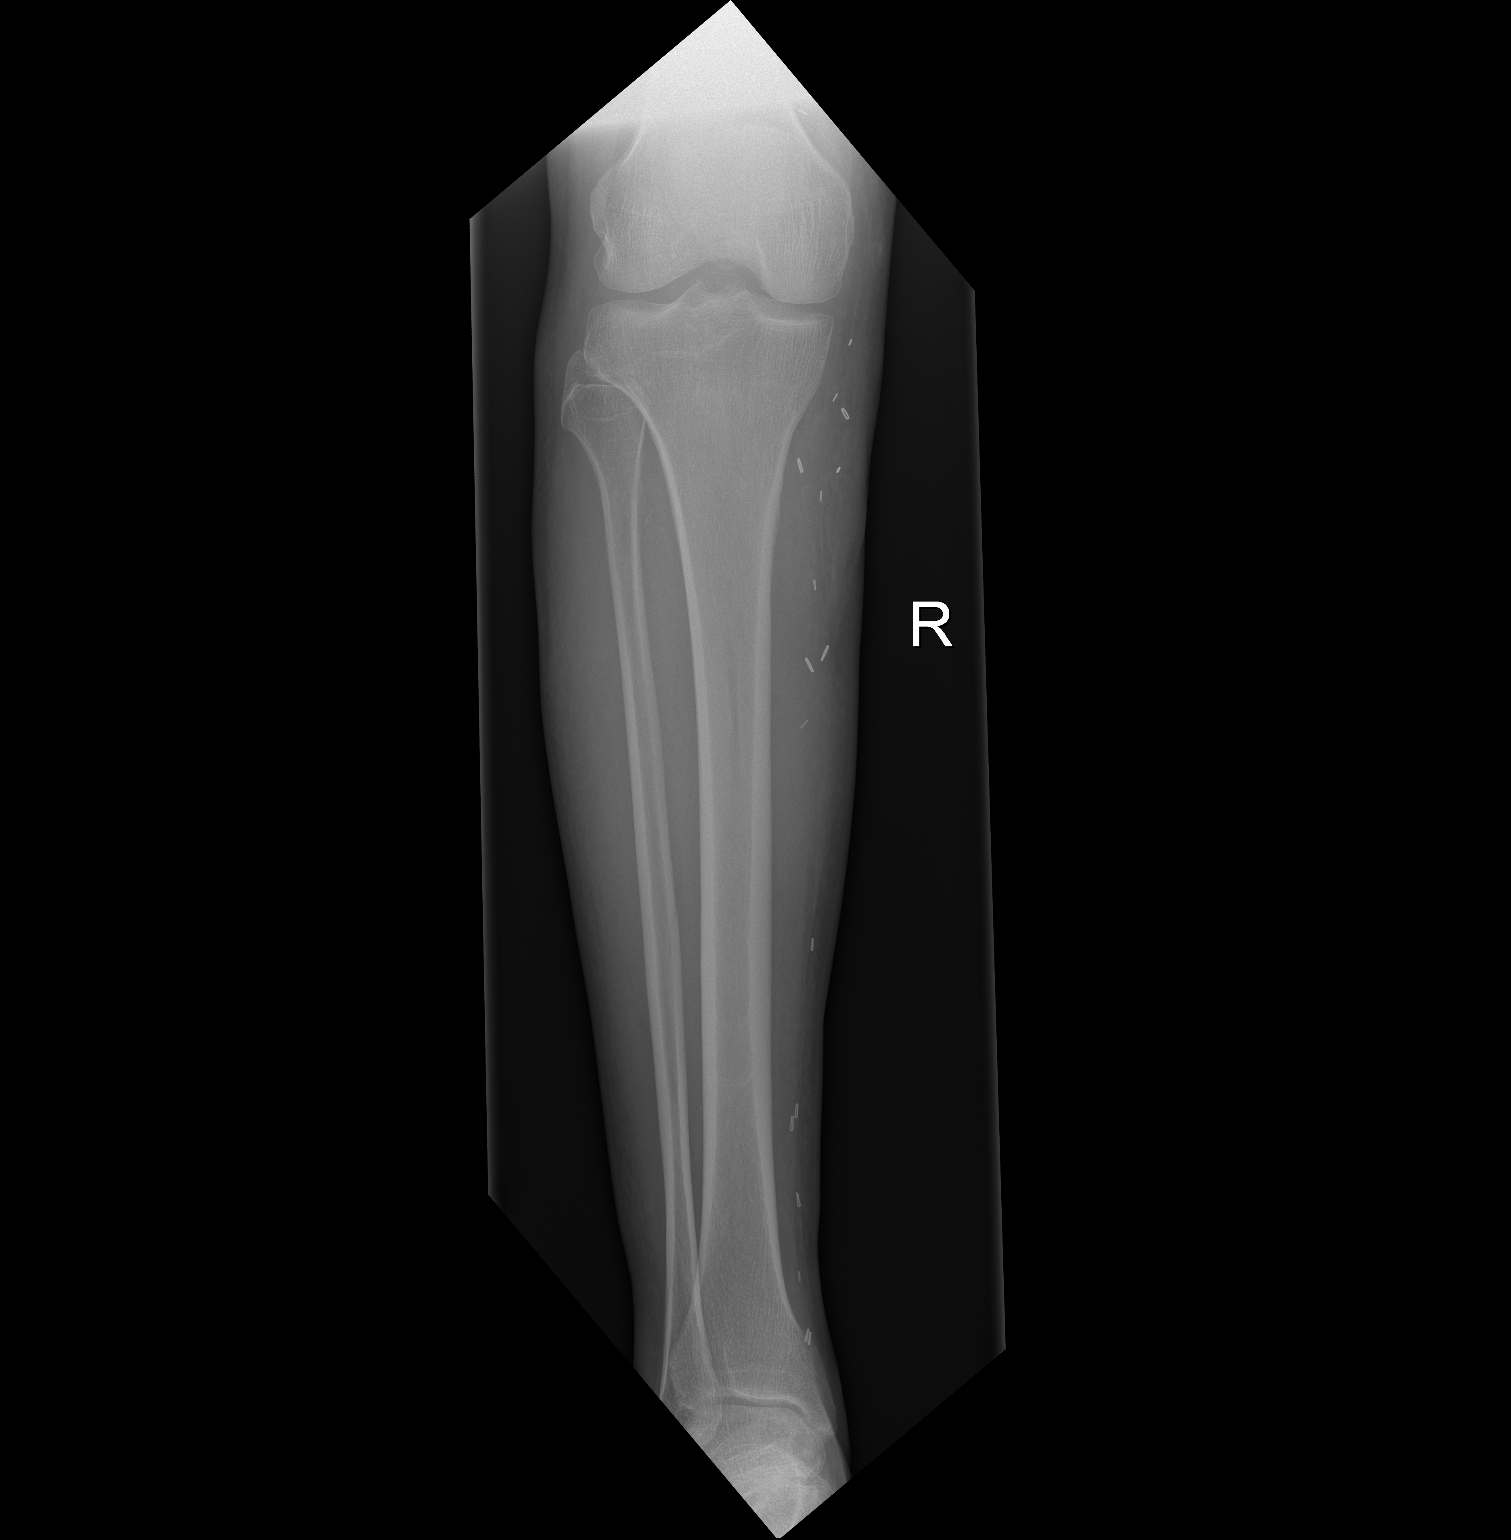

[x tib-fib lat right (1 of 2)]
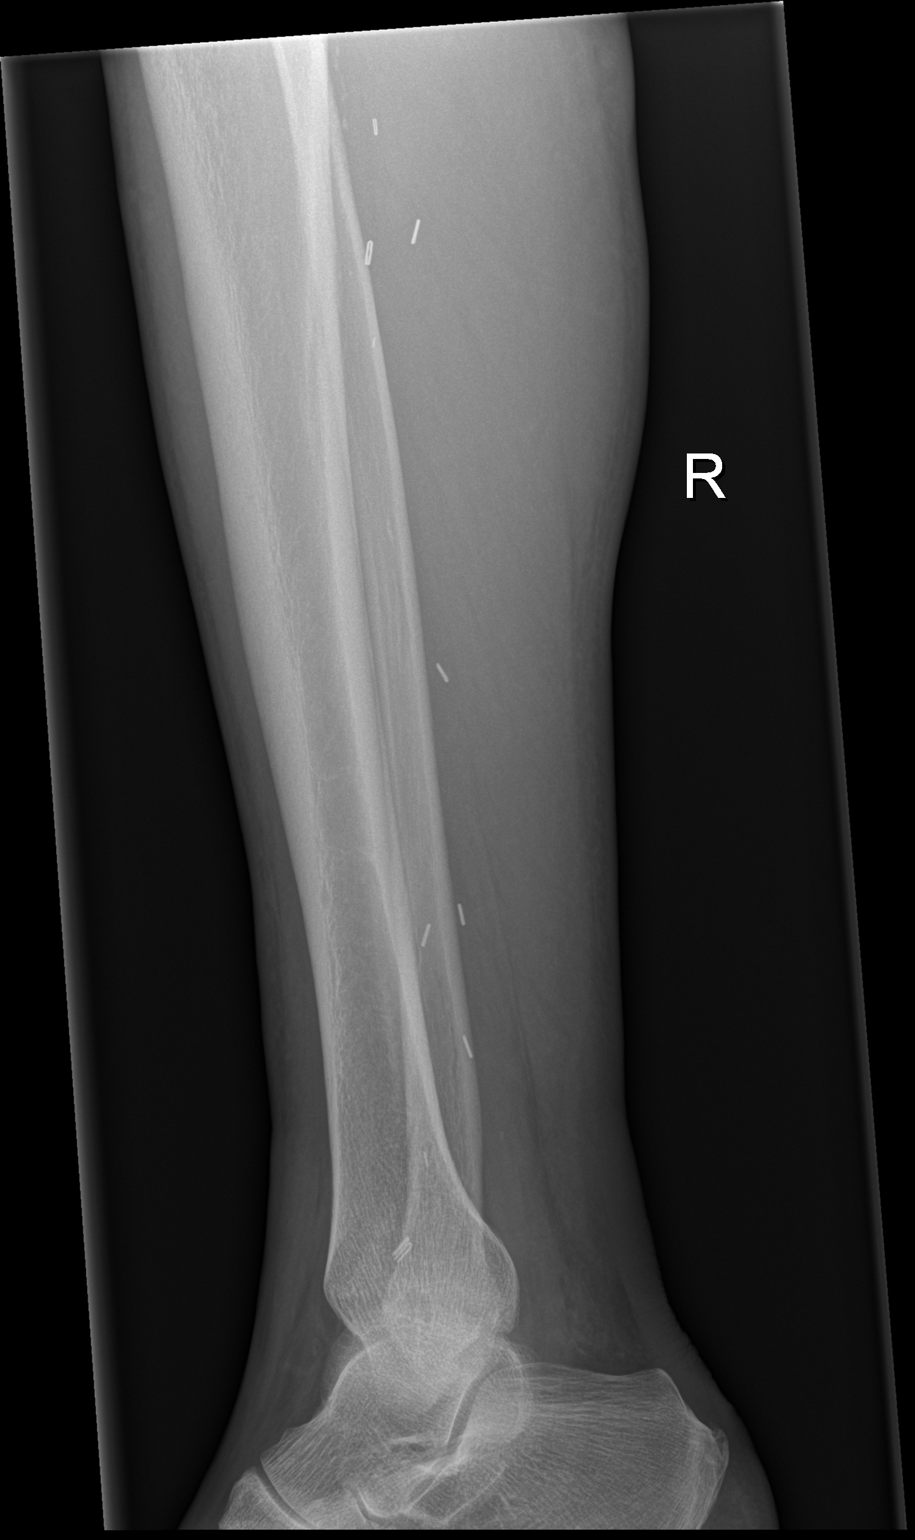

[x tib-fib lat right (2 of 2)]
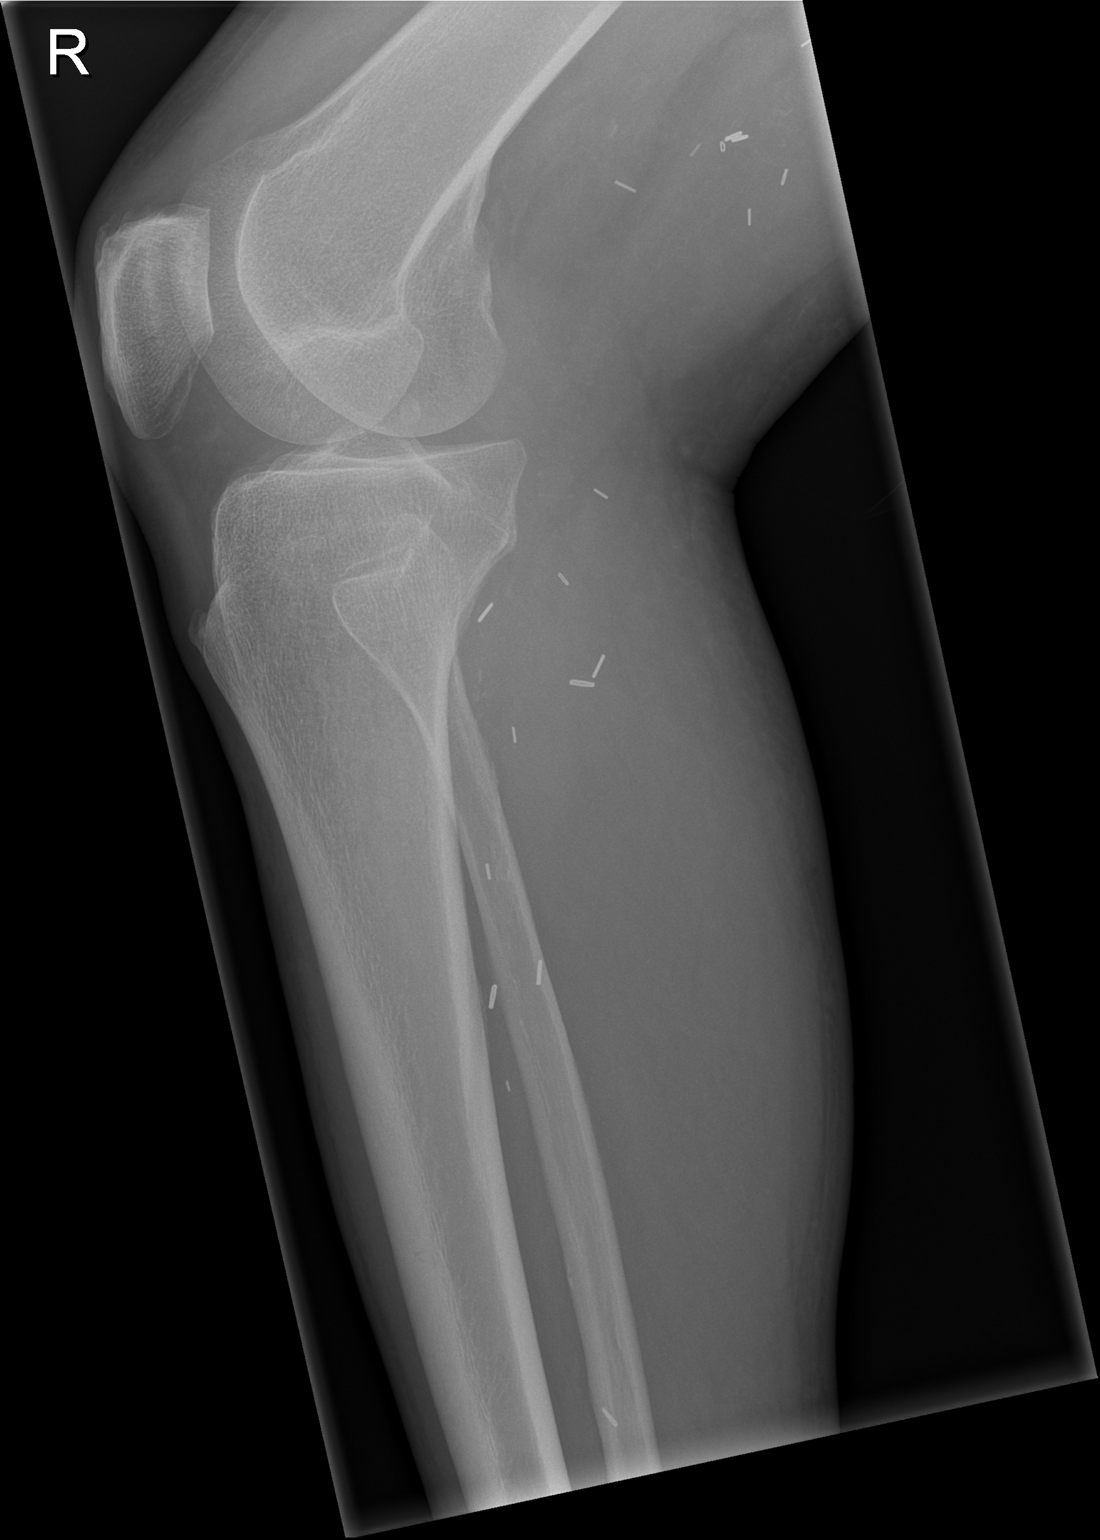

[3 of 3 positions shown; findings below may reference images not displayed]

FINDINGS: Surgical clips overlie the medial aspect of the tibia and
fibula.  There is soft tissue swelling without evidence for soft
tissue gas or radiopaque foreign body.  No evidence for fracture or
subluxation. Degenerative changes are seen in the patellofemoral
joint.
IMPRESSION: 1.  Soft tissue swelling.
2.  No evidence for acute osseous abnormality.

## 2013-10-02 NOTE — Progress Notes (Signed)
Internal Medicine Clinic Attending  I saw and evaluated the patient.  I personally confirmed the key portions of the history and exam documented by Dr. Ahmed and I reviewed pertinent patient test results.  The assessment, diagnosis, and plan were formulated together and I agree with the documentation in the resident's note.   
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
## 2013-10-03 IMAGING — CR DG CHEST 2V
2 series · 2 of 2 positions shown · non-contrast
Comparison: 05/04/2011 and earlier.

CLINICAL DATA: 50-year-old male preoperative study for right lower
extremity debridement.  Hypertension and diabetes.

CHEST - 2 VIEW

[view not recorded (1 of 2)]
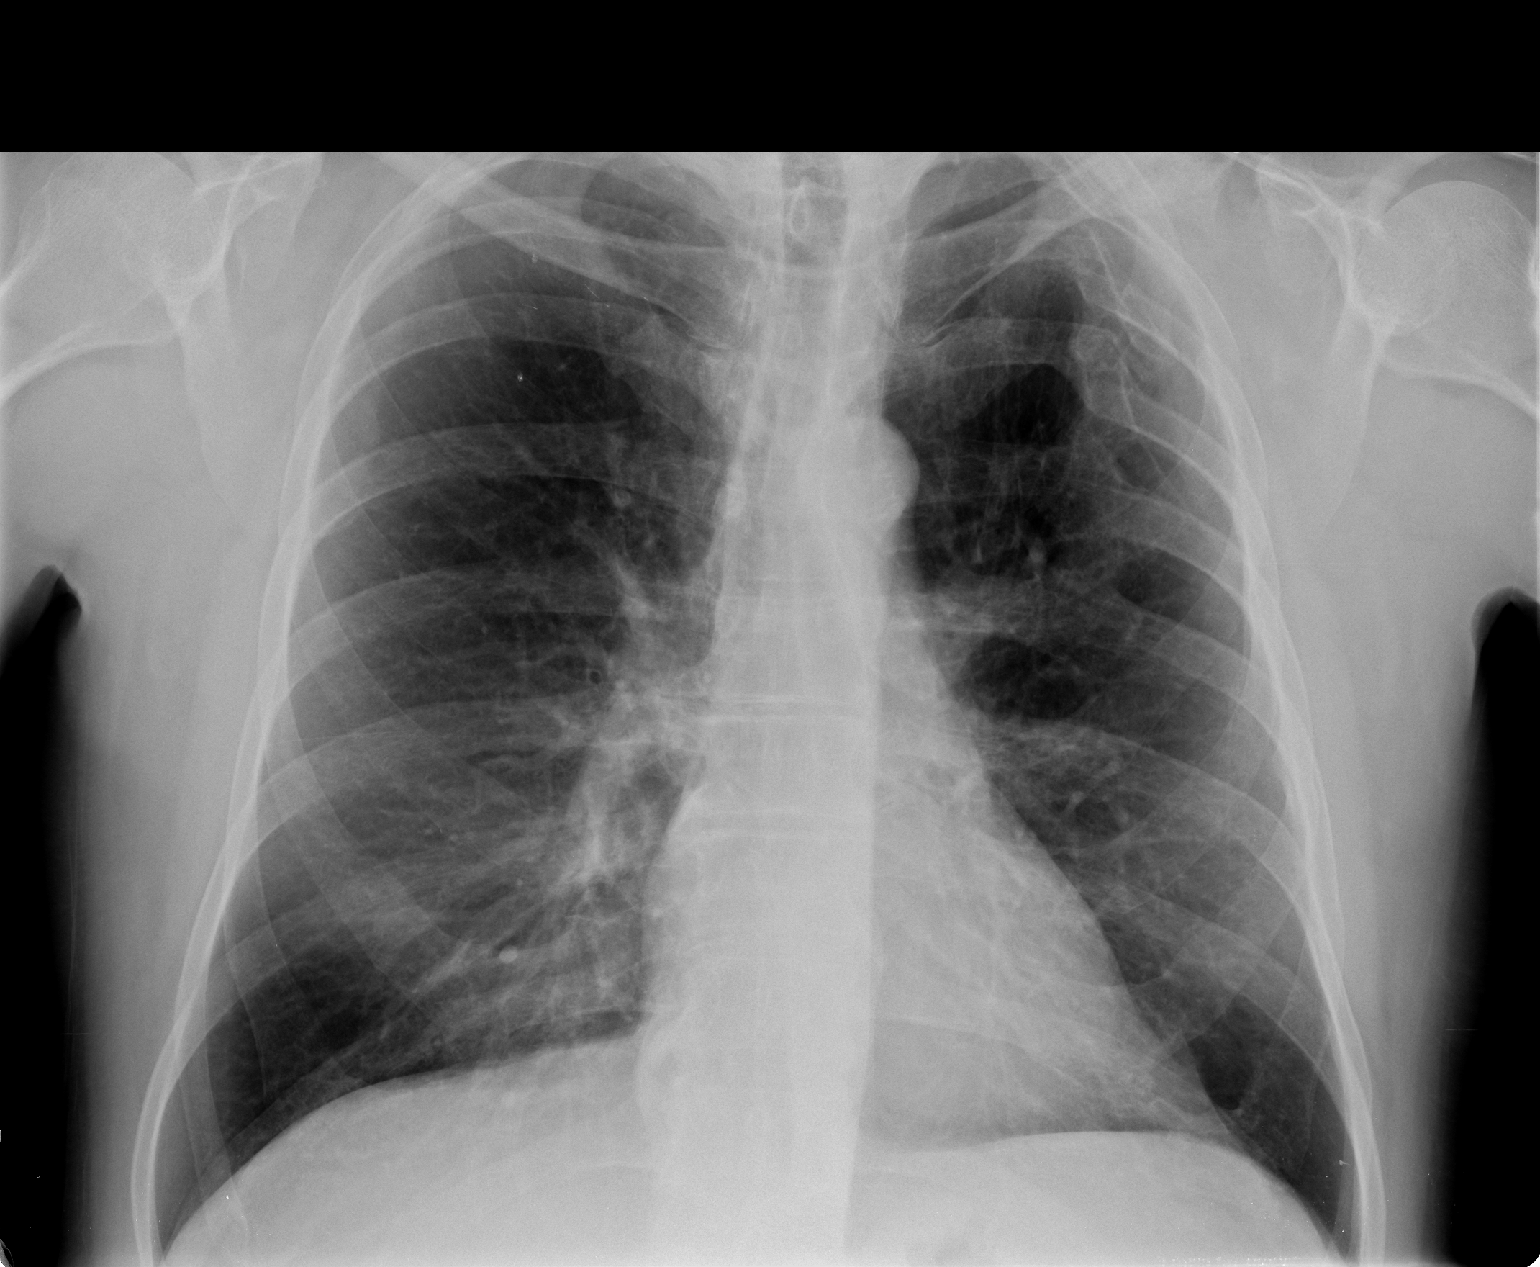

[view not recorded (2 of 2)]
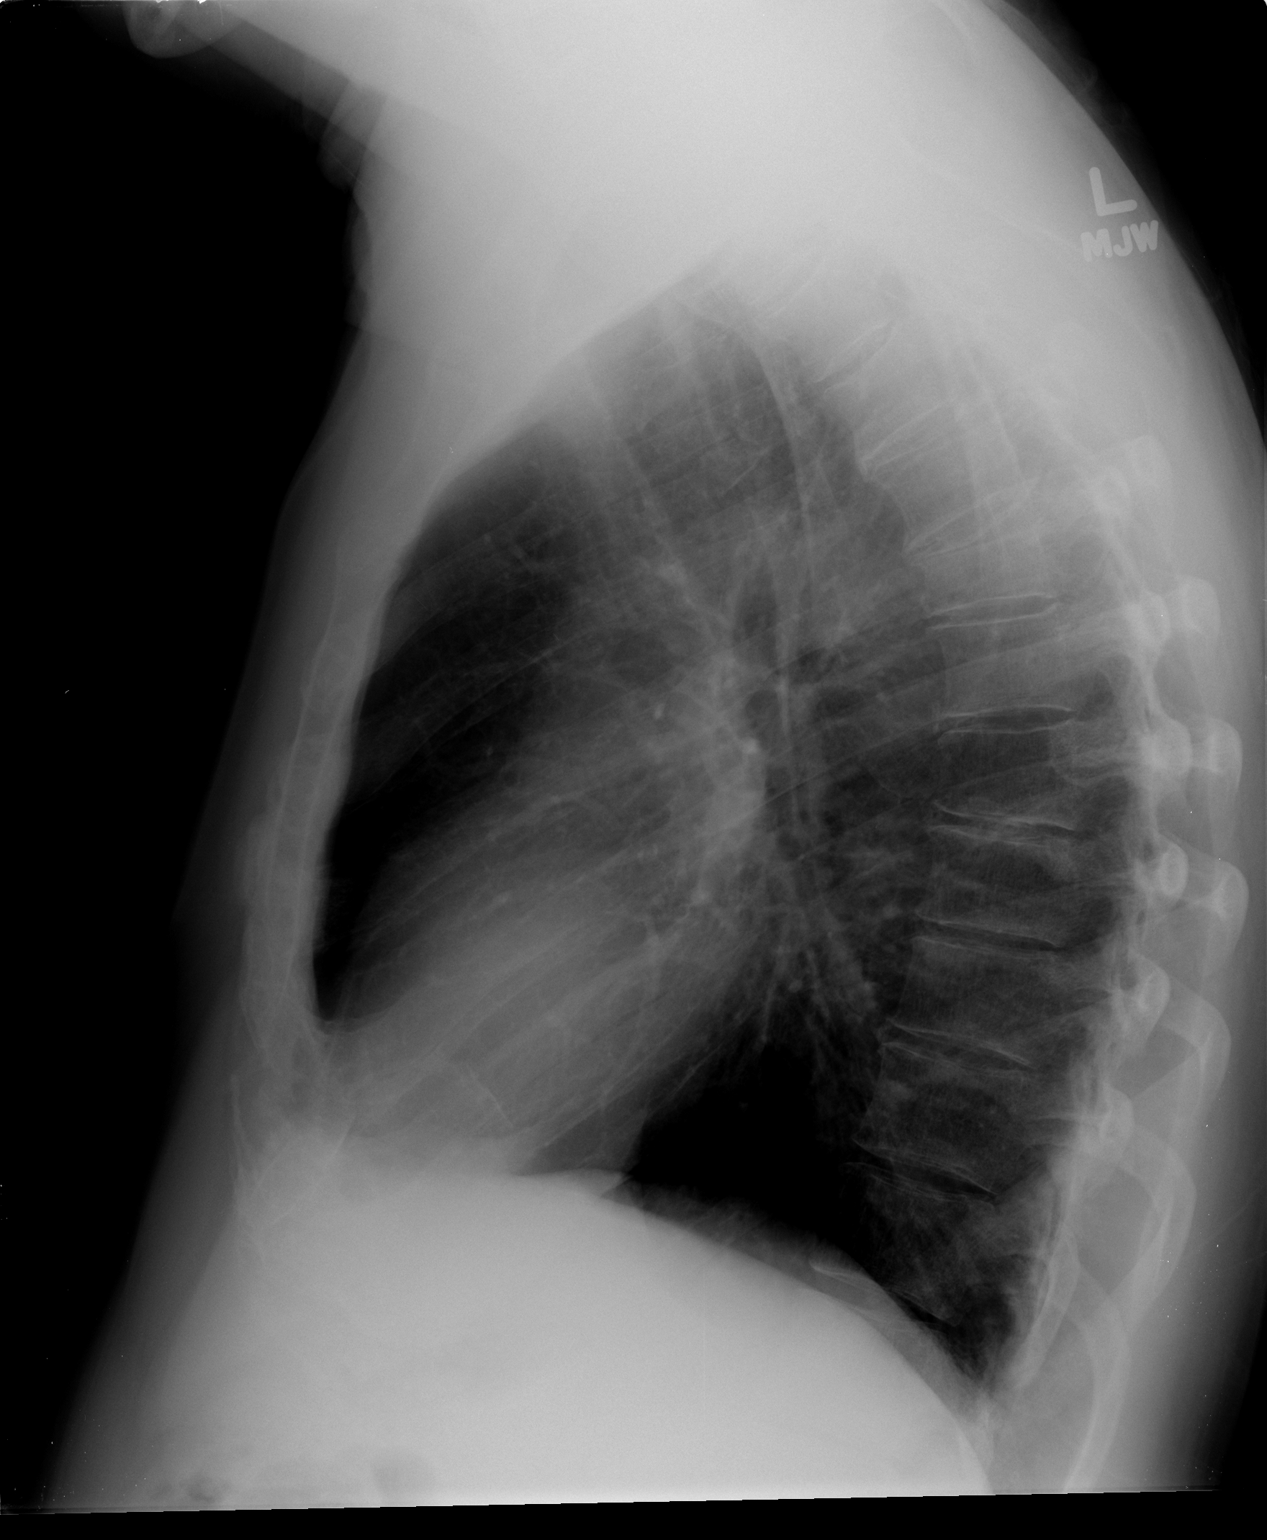

[2 of 2 positions shown; findings below may reference images not displayed]

FINDINGS: Chronically large lung volumes are stable.  Cardiac size
and mediastinal contours are within normal limits.  Visualized
tracheal air column is within normal limits.  No pneumothorax,
pulmonary edema, pleural effusion or consolidation.  Screen
artifact versus increased small calcified granulomas in the right
upper lobe.  Multilevel chronic left rib fractures.  Stable mild
upper thoracic compression deformity.  Partial visualization of
ACDF hardware.
IMPRESSION: No acute cardiopulmonary abnormality.

## 2013-10-09 ENCOUNTER — Ambulatory Visit: Payer: Medicaid Other | Admitting: Internal Medicine

## 2013-10-11 ENCOUNTER — Encounter: Payer: Self-pay | Admitting: Internal Medicine

## 2013-10-11 ENCOUNTER — Ambulatory Visit (INDEPENDENT_AMBULATORY_CARE_PROVIDER_SITE_OTHER): Payer: Medicaid Other | Admitting: Internal Medicine

## 2013-10-11 VITALS — BP 142/70 | HR 93 | Temp 98.0°F | Wt 142.6 lb

## 2013-10-11 DIAGNOSIS — R0989 Other specified symptoms and signs involving the circulatory and respiratory systems: Secondary | ICD-10-CM

## 2013-10-11 DIAGNOSIS — F411 Generalized anxiety disorder: Secondary | ICD-10-CM

## 2013-10-11 DIAGNOSIS — R0609 Other forms of dyspnea: Secondary | ICD-10-CM

## 2013-10-11 DIAGNOSIS — E119 Type 2 diabetes mellitus without complications: Secondary | ICD-10-CM

## 2013-10-11 DIAGNOSIS — Z720 Tobacco use: Secondary | ICD-10-CM

## 2013-10-11 DIAGNOSIS — F17200 Nicotine dependence, unspecified, uncomplicated: Secondary | ICD-10-CM

## 2013-10-11 DIAGNOSIS — R0600 Dyspnea, unspecified: Secondary | ICD-10-CM

## 2013-10-11 DIAGNOSIS — J449 Chronic obstructive pulmonary disease, unspecified: Secondary | ICD-10-CM

## 2013-10-11 LAB — GLUCOSE, CAPILLARY: Glucose-Capillary: 94 mg/dL (ref 70–99)

## 2013-10-11 MED ORDER — NICOTINE 7 MG/24HR TD PT24: 7.0000 mg | MEDICATED_PATCH | TRANSDERMAL | Status: DC

## 2013-10-11 MED ORDER — NICOTINE 21 MG/24HR TD PT24: 21.0000 mg | MEDICATED_PATCH | TRANSDERMAL | Status: DC

## 2013-10-11 MED ORDER — NICOTINE 14 MG/24HR TD PT24: 14.0000 mg | MEDICATED_PATCH | TRANSDERMAL | Status: DC

## 2013-10-11 NOTE — Assessment & Plan Note (Signed)
Sees therapist and told them about anxiety from dyspnea. He was told that he needs to see psychiatrist for medications. Asked him to go back to his therapist and told them to refer him to their affiliated psychiatrist for anxiety. Did not give him any more klonopin.

## 2013-10-11 NOTE — Progress Notes (Signed)
Subjective:    Patient ID: [name], male    DOB: 1960/11/17, 53 y.o.   MRN: [id number]  Shortness of Breath Associated symptoms include leg swelling. Pertinent negatives include no chest pain, ear pain, fever, headaches, neck pain, rhinorrhea, sore throat or wheezing.     53 yo male with COPD, tobacco abuser, anxiety here for dyspnea. He was seen by me multiple times in the past for dyspnea. Workup includign CT Angio which showed PNA but no PE or any other abnormalities. Xray showed emphysematous changes. He has been using combivent and advair without relief. Only thing that helps him is the klonopin with the breathing. Finished zeepac for PNA.  He gets SOB and then gets very anxious because he cannot breathe. Also has BLE swelling for last 2 days.  Denies fever/chills, chest pain. Gets some back pressure on the upper back with dyspnea.   Review of Systems  Constitutional: Negative for fever, chills, activity change, appetite change and fatigue.  HENT: Negative for congestion, drooling, ear discharge, ear pain, nosebleeds, postnasal drip, rhinorrhea, sinus pressure and sore throat.   Eyes: Negative for pain, discharge and visual disturbance.  Respiratory: Positive for shortness of breath. Negative for cough, chest tightness and wheezing.   Cardiovascular: Positive for leg swelling. Negative for chest pain and palpitations.  Gastrointestinal: Negative for nausea, diarrhea, constipation, abdominal distention and anal bleeding.  Genitourinary: Negative for dysuria, urgency, hematuria, decreased urine volume and difficulty urinating.  Musculoskeletal: Negative for arthralgias, back pain, joint swelling, neck pain and neck stiffness.  Skin: Negative.   Allergic/Immunologic: Negative.   Neurological: Negative for dizziness, seizures, syncope, speech difficulty, weakness, light-headedness, numbness and headaches.  Hematological: Negative.   Psychiatric/Behavioral: The patient is  nervous/anxious.        Objective:   Physical Exam  Constitutional: He is oriented to person, place, and time. He appears cachectic. He appears distressed.  Appears very anxious as he is roaming around in the hallway. Appears uncomfortable from dyspnea.  HENT:  Head: Normocephalic and atraumatic.  Right Ear: External ear normal.  Left Ear: External ear normal.  Nose: Nose normal.  Mouth/Throat: Oropharynx is clear and moist.  Eyes: Conjunctivae and EOM are normal. Pupils are equal, round, and reactive to light. Right eye exhibits no discharge. Left eye exhibits no discharge. No scleral icterus.  Neck: Normal range of motion. Neck supple. No JVD present. No thyromegaly present.  Cardiovascular: Normal rate, regular rhythm, S1 normal, S2 normal, normal heart sounds and intact distal pulses.  Exam reveals no gallop and no friction rub.   No murmur heard. Pulmonary/Chest: Effort normal. No respiratory distress. He has decreased breath sounds. He has no wheezes. He has no rales. He exhibits no tenderness.  Low air movement on bases. hyperresonant on percussion on the lungs. No wheezing. satting 96% with ambulation and 98% at rest on room air.   Abdominal: Soft. Bowel sounds are normal. He exhibits no distension and no mass. There is no tenderness. There is no rebound and no guarding.  Musculoskeletal: Normal range of motion. He exhibits no edema and no tenderness.  Lymphadenopathy:    He has no cervical adenopathy.  Neurological: He is alert and oriented to person, place, and time. He has normal strength and normal reflexes. No cranial nerve deficit or sensory deficit.  Skin: No rash noted. He is not diaphoretic. No erythema. No pallor.  Psychiatric: He has a normal mood and affect. His behavior is normal.  Assessment & Plan:  See problem based a&p. 

## 2013-10-11 NOTE — Assessment & Plan Note (Signed)
Continues to have dyspnea constantly at rest. Dyspnea causes lot of anxiety and also back pain on upper back. CT angio negative for PE but showed PNA, has emphysematous changes on CXR.eated for PNA with zeepac. No fever/cough. SOB only helped by klonopin. Using connivent and advair inhaler without relief.   He does have BLE edema for last 2 days. This may be due to new heart strain likely from pulmonary hypertension from emphysematous changes to his lung. Will get ECHO to evaluate heart strain.  Will also get PFTs. Has very low air movement and hyperresonance on percussion. Likely emphysema from smoking more than 50 years. Asked to quit smoking. Started nicotine patches.  Told patient we need to refer him to psychiatry for anxiety as we cannot keep giving him klonopin.

## 2013-10-11 NOTE — Patient Instructions (Signed)
It's good that you decided to quit smoking.   Ise nicotine patch (21 mg/day) for six weeks, followed by 14 mg/day for two weeks, and finish with 7 mg/day for two weeks.  Don't smoke during this time. This will help your breathing hopefully if you quit smoking.  We will get echocardiogram (ultrasound of your heart), test your breathing with pulmonary function test, and refer you to psychiatry for your anxiety.  General Instructions:   Please bring your medicines with you each time you come to clinic.  Medicines may include prescription medications, over-the-counter medications, herbal remedies, eye drops, vitamins, or other pills.   Progress Toward Treatment Goals:  Treatment Goal 09/26/2012  Hemoglobin A1C at goal  Blood pressure at goal  Stop smoking smoking less  Prevent falls (No Data)    Self Care Goals & Plans:  Self Care Goal 09/25/2013  Manage my medications take my medicines as prescribed; bring my medications to every visit; refill my medications on time  Monitor my health -  Eat healthy foods drink diet soda or water instead of juice or soda; eat more vegetables; eat foods that are low in salt; eat baked foods instead of fried foods  Be physically active -  Stop smoking -  Meeting treatment goals -    Home Blood Glucose Monitoring 09/26/2012  Check my blood sugar no home glucose monitoring  When to check my blood sugar N/A     Care Management & Community Referrals:  Referral 09/26/2012  Referrals made for care management support none needed  Referrals made to community resources none

## 2013-10-11 NOTE — Assessment & Plan Note (Signed)
Wants to quit smoking as we told him this will help his breathing.  Started nicotine patch.   nicotine patch (21 mg/day) for six weeks, followed by 14 mg/day for two weeks, and finish with 7 mg/day for two weeks.

## 2013-10-11 NOTE — Assessment & Plan Note (Signed)
Well controlled. Continue metformin.

## 2013-10-11 NOTE — Progress Notes (Signed)
Medicine attending: I personally interviewed and examined this patient along with resident physician Dr. Dellia Nims. Patient recently evaluated to rule out pulmonary embolism with a negative CT angiogram of the chest. He continues to have a dyspnea with associated anxiety. He is now developing peripheral edema. He is a long-standing cigarette smoker who continues to smoke. He has markedly decreased breath sounds and lungs are hyper resonant to percussion.  I believe his dyspnea is due to progressive emphysema and probable early right sided heart failure which would also explain his edema. We will go ahead and get an echocardiogram to evaluate right heart function. He was counseled with respect to smoking cessation and is willing to try nicotine patches again. Murriel Hopper, M.D., [place]

## 2013-10-19 ENCOUNTER — Telehealth: Payer: Self-pay | Admitting: Licensed Clinical Social Worker

## 2013-10-19 ENCOUNTER — Telehealth: Payer: Self-pay | Admitting: *Deleted

## 2013-10-19 NOTE — Telephone Encounter (Signed)
Mr. Dube was referred to CSW for referral to psychiatry.  CSW placed call to Mr. Studstill, pt states he is current with [facility].  Utilizing [facility], pt is current with [facility] [phone] and was seen in 2014 at [facility] for psychiatry.  Pt requesting CSW to contact [facility] for psychiatry referral.  CSW left message at [facility].

## 2013-10-19 NOTE — Telephone Encounter (Signed)
Contacted pt with appt scheduled for Thurs Oct 15th 1:30pm for the PFTs and 3pm for 2D-Echo. Pt instructed to arrive in admitting @ 1:15pm to register for both test.  Pt also informed of no smoking, breathing treatments, inhalers, and/or caffeine 3-4 hours prior to PFTs. Phone call complete.Despina Hidden Cassady10/8/20153:28 PM

## 2013-10-19 NOTE — Telephone Encounter (Signed)
CSW received return call from [employer]. [facility] had made a referral for [name] to psychiatry but pt was d/c due to no-shows.  Agency will re-refer and requesting med list.  Received return call from [facility], they are emailing referral form to this worker.

## 2013-10-20 ENCOUNTER — Telehealth: Payer: Self-pay | Admitting: *Deleted

## 2013-10-20 NOTE — Telephone Encounter (Signed)
Pt calls and states he feels like his life is going to end every day because he cannot breathe, he states sometimes "the pills help and sometimes they dont but i dont know why they stopped them". He is advised if he is having shortness of breath to go to the ED or urg care. He states they wont help him, again he is advised to go to ED or urg care. He is offered an appt for next week and only answers stating thank you and hangs up

## 2013-10-26 ENCOUNTER — Ambulatory Visit (HOSPITAL_COMMUNITY)
Admission: RE | Admit: 2013-10-26 | Discharge: 2013-10-26 | Disposition: A | Discharge status: Home / Self Care | Payer: Medicaid Other | Source: Ambulatory Visit | Attending: Internal Medicine | Admitting: Internal Medicine

## 2013-10-26 DIAGNOSIS — R0600 Dyspnea, unspecified: Secondary | ICD-10-CM

## 2013-10-26 DIAGNOSIS — I517 Cardiomegaly: Secondary | ICD-10-CM

## 2013-10-26 DIAGNOSIS — J449 Chronic obstructive pulmonary disease, unspecified: Secondary | ICD-10-CM

## 2013-10-26 LAB — PULMONARY FUNCTION TEST
DL/VA % PRED: 68 %
DL/VA: 3.29 ml/min/mmHg/L
DLCO cor % pred: 40 %
DLCO cor: 14.61 ml/min/mmHg
DLCO unc % pred: 39 %
DLCO unc: 14.48 ml/min/mmHg
FEF 25-75 POST: 1.07 L/s
FEF 25-75 Pre: 0.49 L/sec
FEF2575-%CHANGE-POST: 116 %
FEF2575-%PRED-POST: 30 %
FEF2575-%PRED-PRE: 14 %
FEV1-%Change-Post: 20 %
FEV1-%PRED-POST: 37 %
FEV1-%PRED-PRE: 30 %
FEV1-PRE: 1.28 L
FEV1-Post: 1.54 L
FEV1FVC-%CHANGE-POST: 3 %
FEV1FVC-%PRED-PRE: 63 %
FEV6-%CHANGE-POST: 20 %
FEV6-%Pred-Post: 57 %
FEV6-%Pred-Pre: 47 %
FEV6-Post: 3.01 L
FEV6-Pre: 2.5 L
FEV6FVC-%Change-Post: 3 %
FEV6FVC-%PRED-POST: 103 %
FEV6FVC-%Pred-Pre: 99 %
FVC-%CHANGE-POST: 16 %
FVC-%PRED-POST: 55 %
FVC-%Pred-Pre: 48 %
FVC-Post: 3.04 L
FVC-Pre: 2.61 L
POST FEV1/FVC RATIO: 51 %
PRE FEV1/FVC RATIO: 49 %
PRE FEV6/FVC RATIO: 96 %
Post FEV6/FVC ratio: 99 %
RV % pred: 221 %
RV: 5.11 L
TLC % pred: 98 %
TLC: 7.46 L

## 2013-10-26 MED ORDER — ALBUTEROL SULFATE (2.5 MG/3ML) 0.083% IN NEBU
2.5000 mg | INHALATION_SOLUTION | Freq: Once | RESPIRATORY_TRACT | Status: AC
  Administered 2013-10-26: 2.5 mg via RESPIRATORY_TRACT

## 2013-10-26 NOTE — Telephone Encounter (Signed)
CSW placed call to Mr. Resende.  Discussed option for [facility] to make referral to psychiatrist or [facility].  Pt requesting [facility] to make referral for psychiatry.  CSW inquired if it were okay to fax chart info to [facility] for psychiatry referral.  Mr. Kraeger in agreement.  CSW will proceed with sending referral to [facility] for psychiatry referral.

## 2013-10-26 NOTE — Progress Notes (Signed)
  Echocardiogram 2D Echocardiogram has been performed.  [name] 10/26/2013, 3:13 PM

## 2013-10-27 ENCOUNTER — Emergency Department (HOSPITAL_COMMUNITY)
Admission: EM | Admit: 2013-10-27 | Discharge: 2013-10-27 | Disposition: A | Discharge status: Home / Self Care | Payer: Medicaid Other | Attending: Emergency Medicine | Admitting: Emergency Medicine

## 2013-10-27 ENCOUNTER — Emergency Department (HOSPITAL_COMMUNITY): Payer: Medicaid Other

## 2013-10-27 ENCOUNTER — Encounter (HOSPITAL_COMMUNITY): Payer: Self-pay | Admitting: Emergency Medicine

## 2013-10-27 DIAGNOSIS — I639 Cerebral infarction, unspecified: Secondary | ICD-10-CM | POA: Insufficient documentation

## 2013-10-27 DIAGNOSIS — R609 Edema, unspecified: Secondary | ICD-10-CM | POA: Diagnosis not present

## 2013-10-27 DIAGNOSIS — I10 Essential (primary) hypertension: Secondary | ICD-10-CM | POA: Diagnosis not present

## 2013-10-27 DIAGNOSIS — Z8619 Personal history of other infectious and parasitic diseases: Secondary | ICD-10-CM | POA: Insufficient documentation

## 2013-10-27 DIAGNOSIS — M7989 Other specified soft tissue disorders: Secondary | ICD-10-CM | POA: Insufficient documentation

## 2013-10-27 DIAGNOSIS — Z8673 Personal history of transient ischemic attack (TIA), and cerebral infarction without residual deficits: Secondary | ICD-10-CM | POA: Insufficient documentation

## 2013-10-27 DIAGNOSIS — G894 Chronic pain syndrome: Secondary | ICD-10-CM | POA: Insufficient documentation

## 2013-10-27 DIAGNOSIS — Z8614 Personal history of Methicillin resistant Staphylococcus aureus infection: Secondary | ICD-10-CM | POA: Insufficient documentation

## 2013-10-27 DIAGNOSIS — F419 Anxiety disorder, unspecified: Secondary | ICD-10-CM

## 2013-10-27 DIAGNOSIS — E119 Type 2 diabetes mellitus without complications: Secondary | ICD-10-CM | POA: Insufficient documentation

## 2013-10-27 DIAGNOSIS — Z7902 Long term (current) use of antithrombotics/antiplatelets: Secondary | ICD-10-CM | POA: Insufficient documentation

## 2013-10-27 DIAGNOSIS — Z79899 Other long term (current) drug therapy: Secondary | ICD-10-CM | POA: Diagnosis not present

## 2013-10-27 DIAGNOSIS — F410 Panic disorder [episodic paroxysmal anxiety] without agoraphobia: Secondary | ICD-10-CM | POA: Diagnosis present

## 2013-10-27 DIAGNOSIS — Z8781 Personal history of (healed) traumatic fracture: Secondary | ICD-10-CM | POA: Diagnosis not present

## 2013-10-27 DIAGNOSIS — Z7951 Long term (current) use of inhaled steroids: Secondary | ICD-10-CM | POA: Diagnosis not present

## 2013-10-27 DIAGNOSIS — J441 Chronic obstructive pulmonary disease with (acute) exacerbation: Secondary | ICD-10-CM | POA: Diagnosis not present

## 2013-10-27 DIAGNOSIS — G4730 Sleep apnea, unspecified: Secondary | ICD-10-CM | POA: Diagnosis not present

## 2013-10-27 DIAGNOSIS — E785 Hyperlipidemia, unspecified: Secondary | ICD-10-CM | POA: Diagnosis not present

## 2013-10-27 DIAGNOSIS — Z720 Tobacco use: Secondary | ICD-10-CM | POA: Diagnosis not present

## 2013-10-27 DIAGNOSIS — Z7982 Long term (current) use of aspirin: Secondary | ICD-10-CM | POA: Insufficient documentation

## 2013-10-27 DIAGNOSIS — K219 Gastro-esophageal reflux disease without esophagitis: Secondary | ICD-10-CM | POA: Insufficient documentation

## 2013-10-27 DIAGNOSIS — Z9889 Other specified postprocedural states: Secondary | ICD-10-CM | POA: Diagnosis not present

## 2013-10-27 DIAGNOSIS — Z87828 Personal history of other (healed) physical injury and trauma: Secondary | ICD-10-CM | POA: Insufficient documentation

## 2013-10-27 DIAGNOSIS — Z87438 Personal history of other diseases of male genital organs: Secondary | ICD-10-CM | POA: Insufficient documentation

## 2013-10-27 DIAGNOSIS — R0602 Shortness of breath: Secondary | ICD-10-CM

## 2013-10-27 HISTORY — DX: Panic disorder (episodic paroxysmal anxiety): F41.0

## 2013-10-27 LAB — CBC WITH DIFFERENTIAL/PLATELET
BASOS ABS: 0 10*3/uL (ref 0.0–0.1)
BASOS PCT: 0 % (ref 0–1)
Eosinophils Absolute: 0.2 10*3/uL (ref 0.0–0.7)
Eosinophils Relative: 3 % (ref 0–5)
HCT: 35.3 % — ABNORMAL LOW (ref 39.0–52.0)
Hemoglobin: 11.4 g/dL — ABNORMAL LOW (ref 13.0–17.0)
Lymphocytes Relative: 18 % (ref 12–46)
Lymphs Abs: 1.1 10*3/uL (ref 0.7–4.0)
MCH: 28.2 pg (ref 26.0–34.0)
MCHC: 32.3 g/dL (ref 30.0–36.0)
MCV: 87.4 fL (ref 78.0–100.0)
Monocytes Absolute: 0.8 10*3/uL (ref 0.1–1.0)
Monocytes Relative: 13 % — ABNORMAL HIGH (ref 3–12)
NEUTROS ABS: 4.2 10*3/uL (ref 1.7–7.7)
Neutrophils Relative %: 66 % (ref 43–77)
Platelets: 249 10*3/uL (ref 150–400)
RBC: 4.04 MIL/uL — ABNORMAL LOW (ref 4.22–5.81)
RDW: 14.4 % (ref 11.5–15.5)
WBC: 6.3 10*3/uL (ref 4.0–10.5)

## 2013-10-27 LAB — COMPREHENSIVE METABOLIC PANEL WITH GFR
ALT: 18 U/L (ref 0–53)
AST: 27 U/L (ref 0–37)
Albumin: 3.1 g/dL — ABNORMAL LOW (ref 3.5–5.2)
Alkaline Phosphatase: 74 U/L (ref 39–117)
Anion gap: 11 (ref 5–15)
BUN: 10 mg/dL (ref 6–23)
CO2: 27 meq/L (ref 19–32)
Calcium: 8.7 mg/dL (ref 8.4–10.5)
Chloride: 96 meq/L (ref 96–112)
Creatinine, Ser: 0.66 mg/dL (ref 0.50–1.35)
GFR calc Af Amer: >90 mL/min
GFR calc non Af Amer: >90 mL/min
Glucose, Bld: 126 mg/dL — ABNORMAL HIGH (ref 70–99)
Potassium: 4.1 meq/L (ref 3.7–5.3)
Sodium: 134 meq/L — ABNORMAL LOW (ref 137–147)
Total Bilirubin: 0.3 mg/dL (ref 0.3–1.2)
Total Protein: 8 g/dL (ref 6.0–8.3)

## 2013-10-27 LAB — I-STAT TROPONIN, ED: Troponin i, poc: 0.01 ng/mL (ref 0.00–0.08)

## 2013-10-27 LAB — PRO B NATRIURETIC PEPTIDE: Pro B Natriuretic peptide (BNP): 357.7 pg/mL — ABNORMAL HIGH (ref 0–125)

## 2013-10-27 LAB — I-STAT CG4 LACTIC ACID, ED: LACTIC ACID, VENOUS: 0.7 mmol/L (ref 0.5–2.2)

## 2013-10-27 MED ORDER — CLONAZEPAM 0.5 MG PO TABS
0.5000 mg | ORAL_TABLET | Freq: Once | ORAL | Status: AC
  Administered 2013-10-27: 0.5 mg via ORAL
  Filled 2013-10-27: qty 1

## 2013-10-27 MED ORDER — FUROSEMIDE 20 MG PO TABS
20.0000 mg | ORAL_TABLET | Freq: Once | ORAL | Status: AC
Start: 2013-10-27 — End: 2013-10-27
  Administered 2013-10-27: 20 mg via ORAL

## 2013-10-27 MED ORDER — FUROSEMIDE 10 MG/ML IJ SOLN: 20.0000 mg | Freq: Once | INTRAMUSCULAR | Status: DC

## 2013-10-27 MED ORDER — CLONAZEPAM 0.5 MG PO TABS: 0.5000 mg | ORAL_TABLET | Freq: Two times a day (BID) | ORAL | Status: DC | PRN
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
## 2013-10-27 NOTE — ED Provider Notes (Signed)
I saw and evaluated the patient, reviewed the resident's note and I agree with the findings and plan.   EKG Interpretation   Date/Time:  Friday October 27 2013 09:08:34 EDT Ventricular Rate:  87 PR Interval:  165 QRS Duration: 90 QT Interval:  370 QTC Calculation: 445 R Axis:   76 Text Interpretation:  Sinus rhythm No significant change since last  tracing Confirmed by Dorrine Montone  MD, Lasharon Dunivan ([id number]) on 10/27/2013 9:42:43 AM      [name] is a 53 y.o. male hx of stroke, HTN, DM here with SOB. He had extensive workup in the last 3 weeks, including CTA and pulmonary testing and echo (grade 1 dysfunction). More SOB over the last week since he ran out of clonopin. Has anxiety associated with it. Denies chest pain. Vitals stable. Lungs clear. 2+ edema (chronic). Labs showed BNP 300. CXR showed possible infiltrate but WBC nl, lactate nl and afebrile. I think likely pleural effusion. Given dose of lasix. Will have him f/u with PMD and cardiology.    Wandra Arthurs, MD 10/27/13 1101

## 2013-10-27 NOTE — Discharge Instructions (Signed)

## 2013-10-27 NOTE — ED Notes (Addendum)
PT states he is being "worked up" for [employer] and they have diagnosed him with panic attacks.  Pt was taking clonopin, but they told them he can't take them anymore until he sees a psychiatrist.  It's been a week since he took his last clonopin. Pt states last night he was up screaming he was so scared.  Pt also states loss of 100 lbs in last 3 months b/c he hasn't eaten.

## 2013-10-27 NOTE — ED Notes (Signed)
Lab results reported to Dr.Yao. 

## 2013-10-31 ENCOUNTER — Other Ambulatory Visit: Payer: Self-pay | Admitting: *Deleted

## 2013-11-01 MED ORDER — HYDROXYZINE HCL 25 MG PO TABS: 25.0000 mg | ORAL_TABLET | Freq: Two times a day (BID) | ORAL | Status: DC | PRN

## 2013-11-02 ENCOUNTER — Telehealth: Payer: Self-pay | Admitting: *Deleted

## 2013-11-02 NOTE — Telephone Encounter (Signed)
Pt would like the doctor to call and discuss his results from his "heart tests", please call [phone]

## 2013-11-02 NOTE — Telephone Encounter (Signed)
I returned Mr Siguenza's phone call at approximately 3:10 PM. He wanted to know the results of his echo. I explained to him his echo results which were notable for grade 1 diastolic dysfunction. He expressed that he still feels like he cannot breathe and that if the echo did not explain it he was really nervous what could be causing it. I encouraged him to go to the ED if he is worried that he cannot breathe, has chest pain, or other worrisome medical condition. He said that he was worried that he needs more klonipin and I told him that I would have to meet him before I could consider prescribing it. I told him that I am in clinic all November and would be happy to see him if he does not go to the ED beforehand, and he said he could not wait. He said he would be okay seeing another provider if it were earlier. I told him I would call the clinic and see if they could schedule him an appointment soon. I then reiterated that he should go to the ED or seek medical attention if he is concerned about shortness of breath, chest pain, or other conditions. I then called our clinic and explained this situation. They said they will call him and schedule him as soon as possible.  Lottie Mussel, MD 11/02/13 3:22PM

## 2013-11-03 ENCOUNTER — Ambulatory Visit: Payer: Medicaid Other | Admitting: Internal Medicine

## 2013-11-03 ENCOUNTER — Encounter: Payer: Self-pay | Admitting: Internal Medicine

## 2013-11-24 ENCOUNTER — Telehealth: Payer: Self-pay | Admitting: *Deleted

## 2013-11-24 NOTE — Telephone Encounter (Signed)
Refuse ondansetron 4 mg #5. If Mr Penrose has nausea he should come to clinic and be evaluated by MD

## 2013-11-24 NOTE — Telephone Encounter (Signed)
Ondansetron 4mg  requested for refill last filled 08/26/13 #5

## 2013-11-30 ENCOUNTER — Inpatient Hospital Stay (HOSPITAL_COMMUNITY)
Admission: EM | Admit: 2013-11-30 | Discharge: 2013-12-17 | DRG: 023 | Disposition: A | Discharge status: Skilled Nursing Facility | Payer: Medicaid Other | Attending: Internal Medicine | Admitting: Internal Medicine

## 2013-11-30 ENCOUNTER — Emergency Department (HOSPITAL_COMMUNITY): Payer: Medicaid Other

## 2013-11-30 ENCOUNTER — Encounter (HOSPITAL_COMMUNITY): Payer: Self-pay | Admitting: Emergency Medicine

## 2013-11-30 DIAGNOSIS — G894 Chronic pain syndrome: Secondary | ICD-10-CM | POA: Diagnosis present

## 2013-11-30 DIAGNOSIS — R509 Fever, unspecified: Secondary | ICD-10-CM

## 2013-11-30 DIAGNOSIS — Z720 Tobacco use: Secondary | ICD-10-CM | POA: Diagnosis present

## 2013-11-30 DIAGNOSIS — G8194 Hemiplegia, unspecified affecting left nondominant side: Secondary | ICD-10-CM | POA: Diagnosis not present

## 2013-11-30 DIAGNOSIS — Z981 Arthrodesis status: Secondary | ICD-10-CM

## 2013-11-30 DIAGNOSIS — I10 Essential (primary) hypertension: Secondary | ICD-10-CM | POA: Diagnosis present

## 2013-11-30 DIAGNOSIS — K219 Gastro-esophageal reflux disease without esophagitis: Secondary | ICD-10-CM | POA: Diagnosis present

## 2013-11-30 DIAGNOSIS — T380X5A Adverse effect of glucocorticoids and synthetic analogues, initial encounter: Secondary | ICD-10-CM | POA: Diagnosis not present

## 2013-11-30 DIAGNOSIS — Z8673 Personal history of transient ischemic attack (TIA), and cerebral infarction without residual deficits: Secondary | ICD-10-CM

## 2013-11-30 DIAGNOSIS — F4323 Adjustment disorder with mixed anxiety and depressed mood: Secondary | ICD-10-CM

## 2013-11-30 DIAGNOSIS — Z96662 Presence of left artificial ankle joint: Secondary | ICD-10-CM | POA: Diagnosis present

## 2013-11-30 DIAGNOSIS — E785 Hyperlipidemia, unspecified: Secondary | ICD-10-CM | POA: Diagnosis present

## 2013-11-30 DIAGNOSIS — B1920 Unspecified viral hepatitis C without hepatic coma: Secondary | ICD-10-CM | POA: Diagnosis present

## 2013-11-30 DIAGNOSIS — W19XXXA Unspecified fall, initial encounter: Secondary | ICD-10-CM | POA: Diagnosis present

## 2013-11-30 DIAGNOSIS — Z8249 Family history of ischemic heart disease and other diseases of the circulatory system: Secondary | ICD-10-CM

## 2013-11-30 DIAGNOSIS — I739 Peripheral vascular disease, unspecified: Secondary | ICD-10-CM | POA: Diagnosis present

## 2013-11-30 DIAGNOSIS — S90921A Unspecified superficial injury of right foot, initial encounter: Secondary | ICD-10-CM | POA: Diagnosis present

## 2013-11-30 DIAGNOSIS — I5032 Chronic diastolic (congestive) heart failure: Secondary | ICD-10-CM | POA: Diagnosis present

## 2013-11-30 DIAGNOSIS — Z590 Homelessness: Secondary | ICD-10-CM | POA: Diagnosis not present

## 2013-11-30 DIAGNOSIS — M549 Dorsalgia, unspecified: Secondary | ICD-10-CM

## 2013-11-30 DIAGNOSIS — R935 Abnormal findings on diagnostic imaging of other abdominal regions, including retroperitoneum: Secondary | ICD-10-CM

## 2013-11-30 DIAGNOSIS — K6289 Other specified diseases of anus and rectum: Secondary | ICD-10-CM | POA: Diagnosis not present

## 2013-11-30 DIAGNOSIS — R262 Difficulty in walking, not elsewhere classified: Secondary | ICD-10-CM | POA: Diagnosis present

## 2013-11-30 DIAGNOSIS — G060 Intracranial abscess and granuloma: Principal | ICD-10-CM | POA: Diagnosis present

## 2013-11-30 DIAGNOSIS — G4730 Sleep apnea, unspecified: Secondary | ICD-10-CM | POA: Diagnosis present

## 2013-11-30 DIAGNOSIS — G4700 Insomnia, unspecified: Secondary | ICD-10-CM | POA: Diagnosis present

## 2013-11-30 DIAGNOSIS — F19239 Other psychoactive substance dependence with withdrawal, unspecified: Secondary | ICD-10-CM | POA: Diagnosis not present

## 2013-11-30 DIAGNOSIS — Z8611 Personal history of tuberculosis: Secondary | ICD-10-CM | POA: Diagnosis not present

## 2013-11-30 DIAGNOSIS — E43 Unspecified severe protein-calorie malnutrition: Secondary | ICD-10-CM | POA: Diagnosis present

## 2013-11-30 DIAGNOSIS — Z7982 Long term (current) use of aspirin: Secondary | ICD-10-CM

## 2013-11-30 DIAGNOSIS — F329 Major depressive disorder, single episode, unspecified: Secondary | ICD-10-CM | POA: Diagnosis present

## 2013-11-30 DIAGNOSIS — Z91013 Allergy to seafood: Secondary | ICD-10-CM | POA: Diagnosis not present

## 2013-11-30 DIAGNOSIS — F1110 Opioid abuse, uncomplicated: Secondary | ICD-10-CM | POA: Diagnosis present

## 2013-11-30 DIAGNOSIS — E46 Unspecified protein-calorie malnutrition: Secondary | ICD-10-CM | POA: Diagnosis present

## 2013-11-30 DIAGNOSIS — K648 Other hemorrhoids: Secondary | ICD-10-CM | POA: Diagnosis present

## 2013-11-30 DIAGNOSIS — S0990XA Unspecified injury of head, initial encounter: Secondary | ICD-10-CM | POA: Diagnosis present

## 2013-11-30 DIAGNOSIS — J189 Pneumonia, unspecified organism: Secondary | ICD-10-CM | POA: Diagnosis present

## 2013-11-30 DIAGNOSIS — F1120 Opioid dependence, uncomplicated: Secondary | ICD-10-CM | POA: Diagnosis present

## 2013-11-30 DIAGNOSIS — F411 Generalized anxiety disorder: Secondary | ICD-10-CM | POA: Diagnosis present

## 2013-11-30 DIAGNOSIS — Z7902 Long term (current) use of antithrombotics/antiplatelets: Secondary | ICD-10-CM | POA: Diagnosis not present

## 2013-11-30 DIAGNOSIS — Z9181 History of falling: Secondary | ICD-10-CM | POA: Diagnosis not present

## 2013-11-30 DIAGNOSIS — Z79899 Other long term (current) drug therapy: Secondary | ICD-10-CM

## 2013-11-30 DIAGNOSIS — Z885 Allergy status to narcotic agent status: Secondary | ICD-10-CM

## 2013-11-30 DIAGNOSIS — B955 Unspecified streptococcus as the cause of diseases classified elsewhere: Secondary | ICD-10-CM | POA: Diagnosis present

## 2013-11-30 DIAGNOSIS — R634 Abnormal weight loss: Secondary | ICD-10-CM | POA: Diagnosis present

## 2013-11-30 DIAGNOSIS — E222 Syndrome of inappropriate secretion of antidiuretic hormone: Secondary | ICD-10-CM | POA: Diagnosis present

## 2013-11-30 DIAGNOSIS — R269 Unspecified abnormalities of gait and mobility: Secondary | ICD-10-CM

## 2013-11-30 DIAGNOSIS — M129 Arthropathy, unspecified: Secondary | ICD-10-CM | POA: Diagnosis present

## 2013-11-30 DIAGNOSIS — B3781 Candidal esophagitis: Secondary | ICD-10-CM | POA: Diagnosis not present

## 2013-11-30 DIAGNOSIS — R531 Weakness: Secondary | ICD-10-CM

## 2013-11-30 DIAGNOSIS — J449 Chronic obstructive pulmonary disease, unspecified: Secondary | ICD-10-CM | POA: Diagnosis present

## 2013-11-30 DIAGNOSIS — K5900 Constipation, unspecified: Secondary | ICD-10-CM | POA: Diagnosis present

## 2013-11-30 DIAGNOSIS — R1319 Other dysphagia: Secondary | ICD-10-CM | POA: Insufficient documentation

## 2013-11-30 DIAGNOSIS — R45851 Suicidal ideations: Secondary | ICD-10-CM | POA: Diagnosis not present

## 2013-11-30 DIAGNOSIS — G9389 Other specified disorders of brain: Secondary | ICD-10-CM | POA: Diagnosis present

## 2013-11-30 DIAGNOSIS — M79606 Pain in leg, unspecified: Secondary | ICD-10-CM | POA: Diagnosis present

## 2013-11-30 DIAGNOSIS — T402X5A Adverse effect of other opioids, initial encounter: Secondary | ICD-10-CM | POA: Diagnosis present

## 2013-11-30 DIAGNOSIS — Z681 Body mass index (BMI) 19 or less, adult: Secondary | ICD-10-CM | POA: Diagnosis not present

## 2013-11-30 DIAGNOSIS — Z9109 Other allergy status, other than to drugs and biological substances: Secondary | ICD-10-CM

## 2013-11-30 DIAGNOSIS — C801 Malignant (primary) neoplasm, unspecified: Secondary | ICD-10-CM

## 2013-11-30 DIAGNOSIS — E119 Type 2 diabetes mellitus without complications: Secondary | ICD-10-CM

## 2013-11-30 DIAGNOSIS — F17210 Nicotine dependence, cigarettes, uncomplicated: Secondary | ICD-10-CM | POA: Diagnosis present

## 2013-11-30 DIAGNOSIS — Z0189 Encounter for other specified special examinations: Secondary | ICD-10-CM

## 2013-11-30 DIAGNOSIS — J440 Chronic obstructive pulmonary disease with acute lower respiratory infection: Secondary | ICD-10-CM | POA: Diagnosis present

## 2013-11-30 DIAGNOSIS — R1310 Dysphagia, unspecified: Secondary | ICD-10-CM | POA: Insufficient documentation

## 2013-11-30 DIAGNOSIS — G936 Cerebral edema: Secondary | ICD-10-CM | POA: Diagnosis present

## 2013-11-30 DIAGNOSIS — F410 Panic disorder [episodic paroxysmal anxiety] without agoraphobia: Secondary | ICD-10-CM | POA: Diagnosis present

## 2013-11-30 DIAGNOSIS — G939 Disorder of brain, unspecified: Secondary | ICD-10-CM | POA: Diagnosis present

## 2013-11-30 DIAGNOSIS — E1142 Type 2 diabetes mellitus with diabetic polyneuropathy: Secondary | ICD-10-CM | POA: Diagnosis present

## 2013-11-30 DIAGNOSIS — Z931 Gastrostomy status: Secondary | ICD-10-CM

## 2013-11-30 DIAGNOSIS — Z4659 Encounter for fitting and adjustment of other gastrointestinal appliance and device: Secondary | ICD-10-CM

## 2013-11-30 DIAGNOSIS — Z515 Encounter for palliative care: Secondary | ICD-10-CM

## 2013-11-30 DIAGNOSIS — Z7189 Other specified counseling: Secondary | ICD-10-CM

## 2013-11-30 LAB — CBC WITH DIFFERENTIAL/PLATELET
BASOS ABS: 0 10*3/uL (ref 0.0–0.1)
BASOS PCT: 0 % (ref 0–1)
Eosinophils Absolute: 0.2 10*3/uL (ref 0.0–0.7)
Eosinophils Relative: 2 % (ref 0–5)
HCT: 38.3 % — ABNORMAL LOW (ref 39.0–52.0)
HEMOGLOBIN: 12.5 g/dL — AB (ref 13.0–17.0)
Lymphocytes Relative: 17 % (ref 12–46)
Lymphs Abs: 1.7 10*3/uL (ref 0.7–4.0)
MCH: 26.5 pg (ref 26.0–34.0)
MCHC: 32.6 g/dL (ref 30.0–36.0)
MCV: 81.3 fL (ref 78.0–100.0)
Monocytes Absolute: 0.7 10*3/uL (ref 0.1–1.0)
Monocytes Relative: 6 % (ref 3–12)
NEUTROS ABS: 7.8 10*3/uL — AB (ref 1.7–7.7)
NEUTROS PCT: 75 % (ref 43–77)
Platelets: 434 10*3/uL — ABNORMAL HIGH (ref 150–400)
RBC: 4.71 MIL/uL (ref 4.22–5.81)
RDW: 14.7 % (ref 11.5–15.5)
WBC: 10.3 10*3/uL (ref 4.0–10.5)

## 2013-11-30 LAB — COMPREHENSIVE METABOLIC PANEL
ALBUMIN: 2.6 g/dL — AB (ref 3.5–5.2)
ALK PHOS: 77 U/L (ref 39–117)
ALT: 40 U/L (ref 0–53)
AST: 82 U/L — AB (ref 0–37)
Anion gap: 15 (ref 5–15)
BILIRUBIN TOTAL: 0.4 mg/dL (ref 0.3–1.2)
BUN: 10 mg/dL (ref 6–23)
CHLORIDE: 95 meq/L — AB (ref 96–112)
CO2: 24 mEq/L (ref 19–32)
Calcium: 9.1 mg/dL (ref 8.4–10.5)
Creatinine, Ser: 0.65 mg/dL (ref 0.50–1.35)
GFR calc Af Amer: >90 mL/min (ref >90)
GFR calc non Af Amer: >90 mL/min (ref >90)
Glucose, Bld: 109 mg/dL — ABNORMAL HIGH (ref 70–99)
POTASSIUM: 4.2 meq/L (ref 3.7–5.3)
SODIUM: 134 meq/L — AB (ref 137–147)
Total Protein: 8.8 g/dL — ABNORMAL HIGH (ref 6.0–8.3)

## 2013-11-30 LAB — CBG MONITORING, ED: GLUCOSE-CAPILLARY: 96 mg/dL (ref 70–99)

## 2013-11-30 LAB — PRO B NATRIURETIC PEPTIDE: Pro B Natriuretic peptide (BNP): 397.8 pg/mL — ABNORMAL HIGH (ref 0–125)

## 2013-11-30 LAB — ETHANOL: Alcohol, Ethyl (B): <11 mg/dL (ref 0–11)

## 2013-11-30 LAB — TROPONIN I: Troponin I: <0.3 ng/mL (ref <0.30)

## 2013-11-30 IMAGING — CR DG CHEST 2V
2 series · 2 of 2 positions shown · non-contrast
Comparison: 06/10/2011 and prior chest radiographs

CLINICAL DATA: Chest pain, cough and shortness of breath.

CHEST - 2 VIEW

[w chest pa]
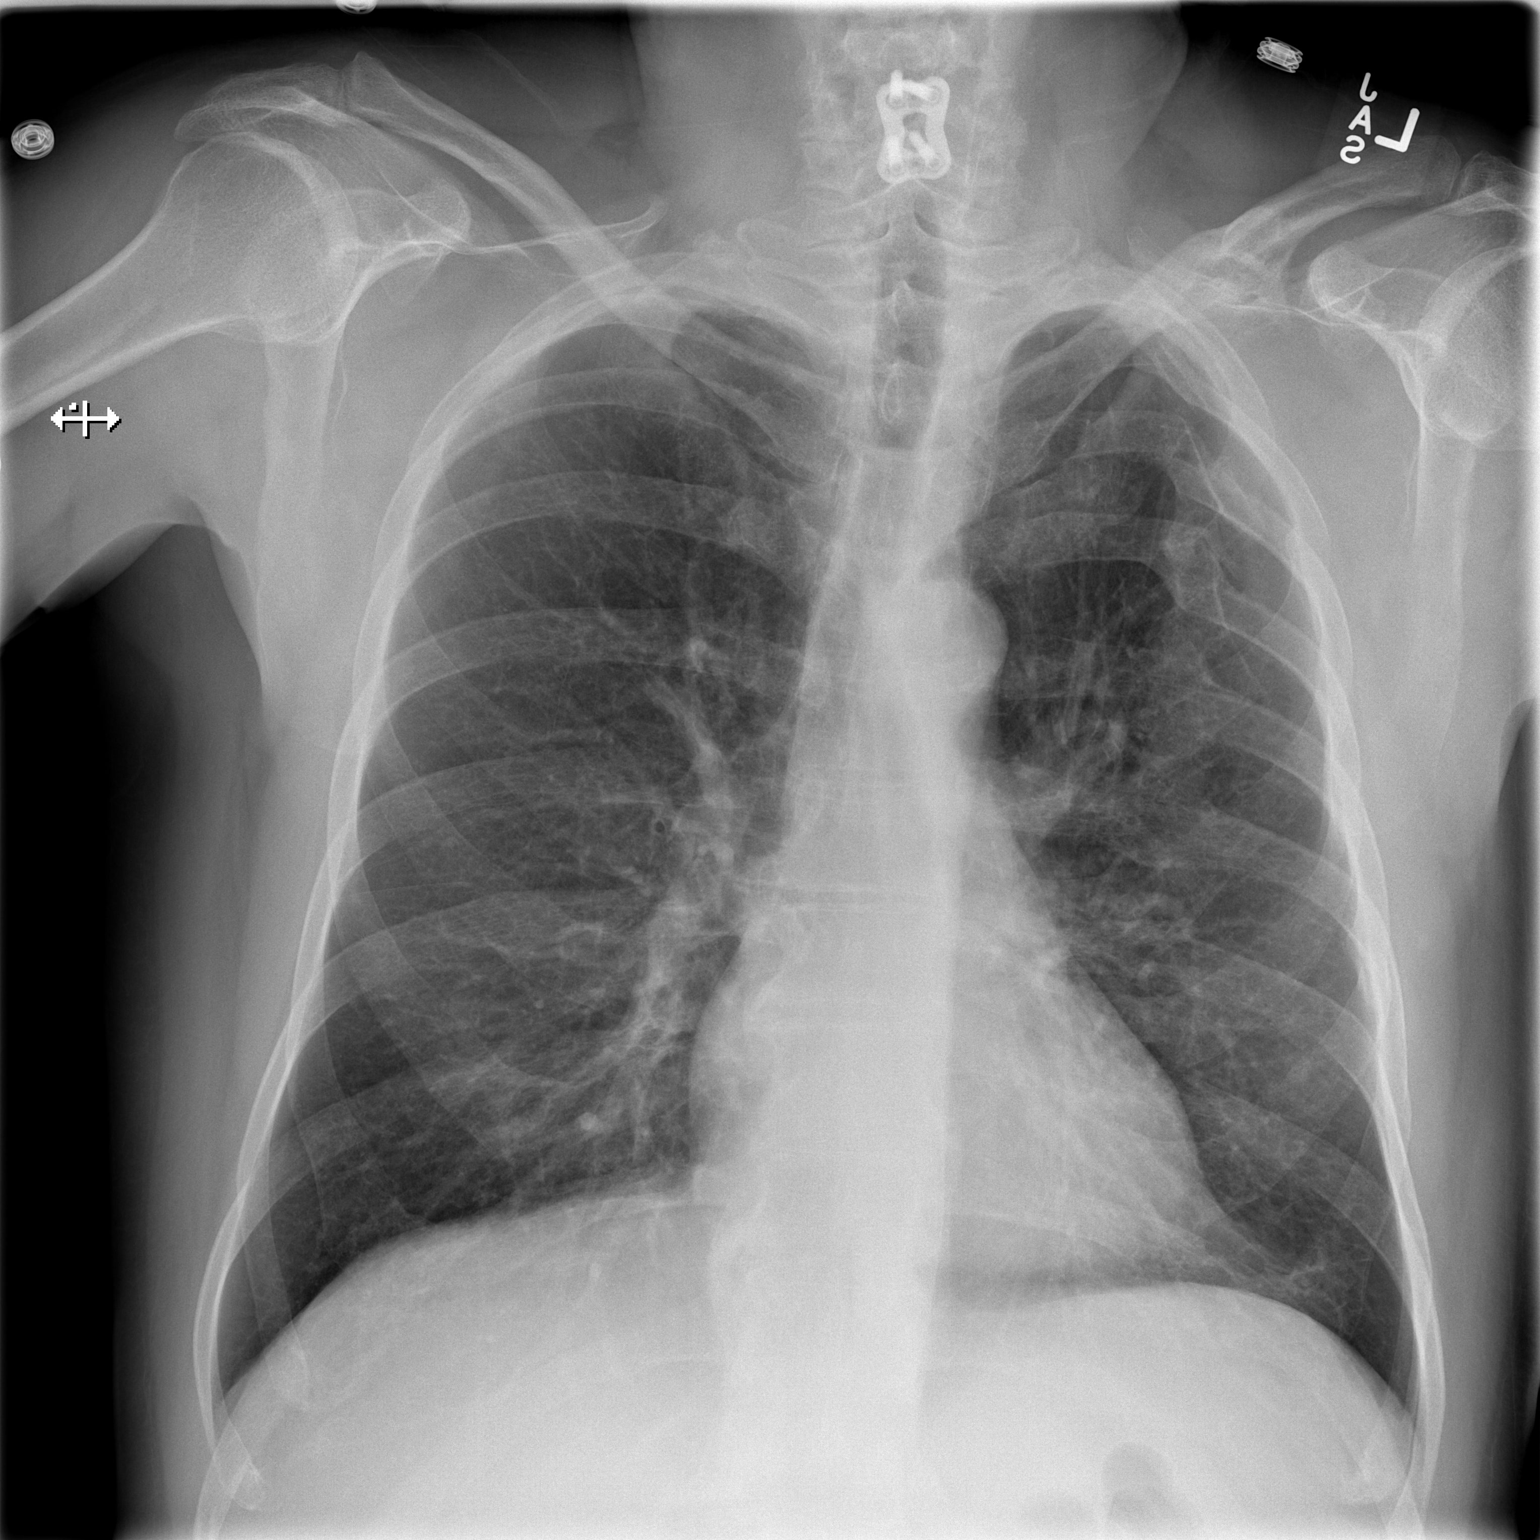

[w chest lat]
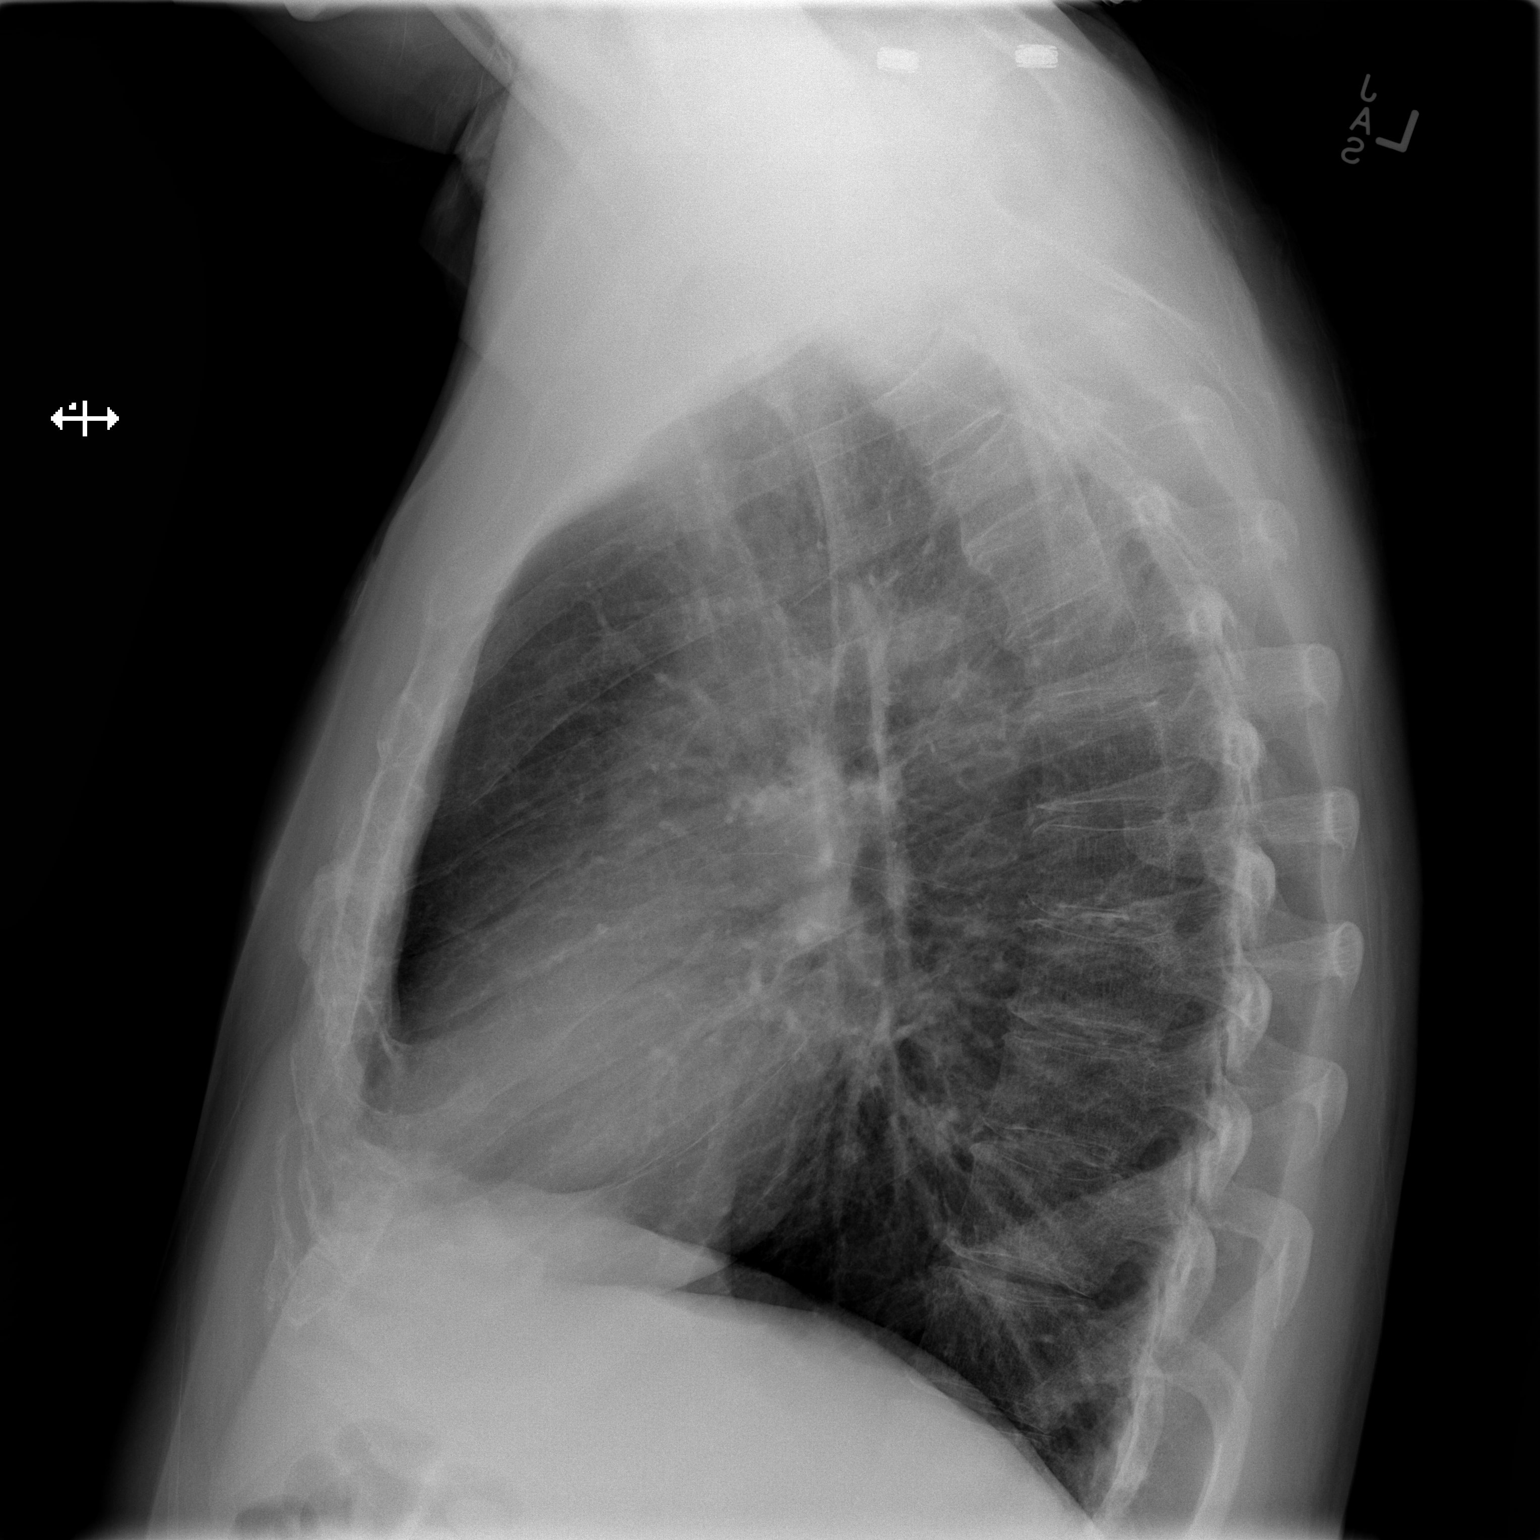

[2 of 2 positions shown; findings below may reference images not displayed]

FINDINGS: The cardiomediastinal silhouette is unremarkable.
There is no evidence of focal airspace disease, pulmonary edema,
suspicious pulmonary nodule/mass, pleural effusion, or
pneumothorax.
No acute bony abnormalities are identified.
Remote left-sided rib and clavicle fractures are again identified.
Prior cervical surgery again noted.
IMPRESSION: No evidence of acute cardiopulmonary disease.

## 2013-11-30 MED ORDER — SODIUM CHLORIDE 0.9 % IV BOLUS (SEPSIS)
250.0000 mL | Freq: Once | INTRAVENOUS | Status: AC
  Administered 2013-11-30: 250 mL via INTRAVENOUS

## 2013-11-30 MED ORDER — OXYCODONE-ACETAMINOPHEN 5-325 MG PO TABS
2.0000 | ORAL_TABLET | Freq: Once | ORAL | Status: AC
  Administered 2013-11-30: 2 via ORAL
  Filled 2013-11-30: qty 2

## 2013-11-30 MED ORDER — AZITHROMYCIN 500 MG IV SOLR: 500.0000 mg | Freq: Once | INTRAVENOUS | Status: DC

## 2013-11-30 MED ORDER — CEFEPIME HCL 2 G IJ SOLR
2.0000 g | Freq: Once | INTRAMUSCULAR | Status: AC
  Administered 2013-12-01: 2 g via INTRAVENOUS
  Filled 2013-11-30: qty 2

## 2013-11-30 MED ORDER — VANCOMYCIN HCL IN DEXTROSE 1-5 GM/200ML-% IV SOLN: 1000.0000 mg | Freq: Once | INTRAVENOUS | Status: DC

## 2013-11-30 MED ORDER — DEXTROSE 5 % IV SOLN: 1.0000 g | Freq: Once | INTRAVENOUS | Status: DC

## 2013-11-30 NOTE — ED Notes (Signed)
Per EMS pt called for leg and foot pain  Pt has lower ext edema bilaterally  Pt states it hurts to walk  Pt states it has been bothering him for the past two days

## 2013-11-30 NOTE — ED Notes (Signed)
Patient transported to X-ray 

## 2013-11-30 NOTE — ED Notes (Addendum)
Patient made aware urine sample is needed, but denies being able to give one at this time
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
## 2013-12-01 ENCOUNTER — Observation Stay (HOSPITAL_COMMUNITY): Payer: Medicaid Other

## 2013-12-01 DIAGNOSIS — E785 Hyperlipidemia, unspecified: Secondary | ICD-10-CM

## 2013-12-01 DIAGNOSIS — F1910 Other psychoactive substance abuse, uncomplicated: Secondary | ICD-10-CM

## 2013-12-01 DIAGNOSIS — I10 Essential (primary) hypertension: Secondary | ICD-10-CM

## 2013-12-01 DIAGNOSIS — K629 Disease of anus and rectum, unspecified: Secondary | ICD-10-CM

## 2013-12-01 DIAGNOSIS — F1110 Opioid abuse, uncomplicated: Secondary | ICD-10-CM | POA: Diagnosis present

## 2013-12-01 DIAGNOSIS — J189 Pneumonia, unspecified organism: Secondary | ICD-10-CM

## 2013-12-01 DIAGNOSIS — E46 Unspecified protein-calorie malnutrition: Secondary | ICD-10-CM | POA: Diagnosis present

## 2013-12-01 DIAGNOSIS — G060 Intracranial abscess and granuloma: Principal | ICD-10-CM

## 2013-12-01 DIAGNOSIS — G939 Disorder of brain, unspecified: Secondary | ICD-10-CM

## 2013-12-01 DIAGNOSIS — C801 Malignant (primary) neoplasm, unspecified: Secondary | ICD-10-CM

## 2013-12-01 DIAGNOSIS — R509 Fever, unspecified: Secondary | ICD-10-CM

## 2013-12-01 DIAGNOSIS — J984 Other disorders of lung: Secondary | ICD-10-CM

## 2013-12-01 DIAGNOSIS — J449 Chronic obstructive pulmonary disease, unspecified: Secondary | ICD-10-CM

## 2013-12-01 DIAGNOSIS — G930 Cerebral cysts: Secondary | ICD-10-CM

## 2013-12-01 DIAGNOSIS — E43 Unspecified severe protein-calorie malnutrition: Secondary | ICD-10-CM | POA: Insufficient documentation

## 2013-12-01 DIAGNOSIS — F419 Anxiety disorder, unspecified: Secondary | ICD-10-CM

## 2013-12-01 DIAGNOSIS — R634 Abnormal weight loss: Secondary | ICD-10-CM

## 2013-12-01 DIAGNOSIS — M79609 Pain in unspecified limb: Secondary | ICD-10-CM

## 2013-12-01 DIAGNOSIS — R198 Other specified symptoms and signs involving the digestive system and abdomen: Secondary | ICD-10-CM

## 2013-12-01 DIAGNOSIS — K6289 Other specified diseases of anus and rectum: Secondary | ICD-10-CM

## 2013-12-01 LAB — COMPREHENSIVE METABOLIC PANEL
ALK PHOS: 73 U/L (ref 39–117)
ALT: 34 U/L (ref 0–53)
AST: 60 U/L — ABNORMAL HIGH (ref 0–37)
Albumin: 2.4 g/dL — ABNORMAL LOW (ref 3.5–5.2)
Anion gap: 12 (ref 5–15)
BUN: 11 mg/dL (ref 6–23)
CO2: 25 meq/L (ref 19–32)
Calcium: 8.5 mg/dL (ref 8.4–10.5)
Chloride: 102 mEq/L (ref 96–112)
Creatinine, Ser: 0.68 mg/dL (ref 0.50–1.35)
Glucose, Bld: 97 mg/dL (ref 70–99)
POTASSIUM: 3.6 meq/L — AB (ref 3.7–5.3)
SODIUM: 139 meq/L (ref 137–147)
Total Bilirubin: 0.4 mg/dL (ref 0.3–1.2)
Total Protein: 7.6 g/dL (ref 6.0–8.3)

## 2013-12-01 LAB — GLUCOSE, CAPILLARY
GLUCOSE-CAPILLARY: 106 mg/dL — AB (ref 70–99)
Glucose-Capillary: 84 mg/dL (ref 70–99)
Glucose-Capillary: 90 mg/dL (ref 70–99)
Glucose-Capillary: 76 mg/dL (ref 70–99)

## 2013-12-01 LAB — CBC WITH DIFFERENTIAL/PLATELET
Basophils Absolute: 0 10*3/uL (ref 0.0–0.1)
Basophils Relative: 0 % (ref 0–1)
EOS ABS: 0.2 10*3/uL (ref 0.0–0.7)
EOS PCT: 2 % (ref 0–5)
HEMATOCRIT: 32.8 % — AB (ref 39.0–52.0)
Hemoglobin: 10.5 g/dL — ABNORMAL LOW (ref 13.0–17.0)
Lymphocytes Relative: 23 % (ref 12–46)
Lymphs Abs: 2.4 10*3/uL (ref 0.7–4.0)
MCH: 25.9 pg — AB (ref 26.0–34.0)
MCHC: 32 g/dL (ref 30.0–36.0)
MCV: 81 fL (ref 78.0–100.0)
Monocytes Absolute: 0.9 10*3/uL (ref 0.1–1.0)
Monocytes Relative: 8 % (ref 3–12)
Neutro Abs: 6.9 10*3/uL (ref 1.7–7.7)
Neutrophils Relative %: 67 % (ref 43–77)
PLATELETS: 375 10*3/uL (ref 150–400)
RBC: 4.05 MIL/uL — AB (ref 4.22–5.81)
RDW: 15 % (ref 11.5–15.5)
WBC: 10.4 10*3/uL (ref 4.0–10.5)

## 2013-12-01 LAB — URINALYSIS, ROUTINE W REFLEX MICROSCOPIC
Bilirubin Urine: NEGATIVE
GLUCOSE, UA: NEGATIVE mg/dL
HGB URINE DIPSTICK: NEGATIVE
Ketones, ur: NEGATIVE mg/dL
Leukocytes, UA: NEGATIVE
Nitrite: NEGATIVE
PH: 8 (ref 5.0–8.0)
Protein, ur: 30 mg/dL — AB
Specific Gravity, Urine: 1.018 (ref 1.005–1.030)
Urobilinogen, UA: 4 mg/dL — ABNORMAL HIGH (ref 0.0–1.0)

## 2013-12-01 LAB — RAPID URINE DRUG SCREEN, HOSP PERFORMED
Amphetamines: NOT DETECTED
Amphetamines: NOT DETECTED
BENZODIAZEPINES: NOT DETECTED
BENZODIAZEPINES: NOT DETECTED
Barbiturates: NOT DETECTED
Barbiturates: NOT DETECTED
COCAINE: POSITIVE — AB
COCAINE: NOT DETECTED
Opiates: POSITIVE — AB
Opiates: POSITIVE — AB
TETRAHYDROCANNABINOL: NOT DETECTED
Tetrahydrocannabinol: NOT DETECTED

## 2013-12-01 LAB — EXPECTORATED SPUTUM ASSESSMENT W REFEX TO RESP CULTURE

## 2013-12-01 LAB — HIV ANTIBODY (ROUTINE TESTING W REFLEX): HIV: NONREACTIVE

## 2013-12-01 LAB — EXPECTORATED SPUTUM ASSESSMENT W GRAM STAIN, RFLX TO RESP C

## 2013-12-01 LAB — URINE MICROSCOPIC-ADD ON

## 2013-12-01 LAB — LACTIC ACID, PLASMA: Lactic Acid, Venous: 0.8 mmol/L (ref 0.5–2.2)

## 2013-12-01 LAB — MAGNESIUM: Magnesium: 2 mg/dL (ref 1.5–2.5)

## 2013-12-01 MED ORDER — FAMOTIDINE 10 MG PO TABS
10.0000 mg | ORAL_TABLET | Freq: Two times a day (BID) | ORAL | Status: DC
  Administered 2013-12-01 – 2013-12-17 (×22): 10 mg via ORAL
  Filled 2013-12-01 (×34): qty 1

## 2013-12-01 MED ORDER — ADULT MULTIVITAMIN W/MINERALS CH
1.0000 | ORAL_TABLET | Freq: Every day | ORAL | Status: DC
  Administered 2013-12-01 – 2013-12-17 (×13): 1 via ORAL
  Filled 2013-12-01 (×15): qty 1

## 2013-12-01 MED ORDER — CLONAZEPAM 0.5 MG PO TABS
0.5000 mg | ORAL_TABLET | Freq: Once | ORAL | Status: AC
  Administered 2013-12-01: 0.5 mg via ORAL
  Filled 2013-12-01: qty 1

## 2013-12-01 MED ORDER — CLONAZEPAM 0.5 MG PO TABS
0.5000 mg | ORAL_TABLET | Freq: Once | ORAL | Status: AC
Start: 2013-12-01 — End: 2013-12-01
  Administered 2013-12-01: 0.5 mg via ORAL
  Filled 2013-12-01: qty 1

## 2013-12-01 MED ORDER — IOHEXOL 300 MG/ML  SOLN
100.0000 mL | Freq: Once | INTRAMUSCULAR | Status: AC | PRN
  Administered 2013-12-01: 100 mL via INTRAVENOUS

## 2013-12-01 MED ORDER — INFLUENZA VAC SPLIT QUAD 0.5 ML IM SUSY
0.5000 mL | PREFILLED_SYRINGE | INTRAMUSCULAR | Status: DC
Start: 2013-12-02 — End: 2013-12-06
  Filled 2013-12-01: qty 0.5

## 2013-12-01 MED ORDER — IPRATROPIUM-ALBUTEROL 0.5-2.5 (3) MG/3ML IN SOLN
3.0000 mL | Freq: Four times a day (QID) | RESPIRATORY_TRACT | Status: DC | PRN
Start: 2013-12-01 — End: 2013-12-17

## 2013-12-01 MED ORDER — MORPHINE SULFATE 2 MG/ML IJ SOLN
2.0000 mg | INTRAMUSCULAR | Status: DC | PRN
  Administered 2013-12-01 – 2013-12-04 (×17): 2 mg via INTRAVENOUS
  Filled 2013-12-01 (×18): qty 1

## 2013-12-01 MED ORDER — ALBUTEROL SULFATE HFA 108 (90 BASE) MCG/ACT IN AERS: 2.0000 | INHALATION_SPRAY | Freq: Four times a day (QID) | RESPIRATORY_TRACT | Status: DC | PRN

## 2013-12-01 MED ORDER — MOMETASONE FURO-FORMOTEROL FUM 100-5 MCG/ACT IN AERO
2.0000 | INHALATION_SPRAY | Freq: Two times a day (BID) | RESPIRATORY_TRACT | Status: DC
  Administered 2013-12-01 – 2013-12-17 (×24): 2 via RESPIRATORY_TRACT
  Filled 2013-12-01 (×2): qty 8.8

## 2013-12-01 MED ORDER — ACETAMINOPHEN 325 MG PO TABS
650.0000 mg | ORAL_TABLET | Freq: Four times a day (QID) | ORAL | Status: DC | PRN
  Administered 2013-12-01 – 2013-12-03 (×4): 650 mg via ORAL
  Filled 2013-12-01 (×4): qty 2

## 2013-12-01 MED ORDER — IOHEXOL 300 MG/ML  SOLN: 50.0000 mL | Freq: Once | INTRAMUSCULAR | Status: AC | PRN

## 2013-12-01 MED ORDER — ENOXAPARIN SODIUM 40 MG/0.4ML ~~LOC~~ SOLN
40.0000 mg | SUBCUTANEOUS | Status: DC
  Administered 2013-12-01 – 2013-12-03 (×2): 40 mg via SUBCUTANEOUS
  Filled 2013-12-01 (×3): qty 0.4

## 2013-12-01 MED ORDER — MORPHINE SULFATE 2 MG/ML IJ SOLN
1.0000 mg | Freq: Once | INTRAMUSCULAR | Status: AC
  Administered 2013-12-01: 1 mg via INTRAVENOUS
  Filled 2013-12-01: qty 1

## 2013-12-01 MED ORDER — DICLOFENAC SODIUM 1 % TD GEL: 2.0000 g | Freq: Four times a day (QID) | TRANSDERMAL | Status: DC | PRN

## 2013-12-01 MED ORDER — KETOROLAC TROMETHAMINE 30 MG/ML IJ SOLN
30.0000 mg | Freq: Once | INTRAMUSCULAR | Status: AC
  Administered 2013-12-01: 30 mg via INTRAVENOUS
  Filled 2013-12-01: qty 1

## 2013-12-01 MED ORDER — HYDROXYZINE HCL 25 MG PO TABS
25.0000 mg | ORAL_TABLET | Freq: Two times a day (BID) | ORAL | Status: DC | PRN
  Administered 2013-12-01 – 2013-12-12 (×3): 25 mg via ORAL
  Filled 2013-12-01 (×4): qty 1

## 2013-12-01 MED ORDER — NICOTINE 21 MG/24HR TD PT24
21.0000 mg | MEDICATED_PATCH | TRANSDERMAL | Status: DC
  Administered 2013-12-01 – 2013-12-17 (×16): 21 mg via TRANSDERMAL
  Filled 2013-12-01 (×17): qty 1

## 2013-12-01 MED ORDER — SODIUM CHLORIDE 0.9 % IV BOLUS (SEPSIS)
1000.0000 mL | Freq: Once | INTRAVENOUS | Status: AC
  Administered 2013-12-01: 1000 mL via INTRAVENOUS

## 2013-12-01 MED ORDER — GADOBENATE DIMEGLUMINE 529 MG/ML IV SOLN
15.0000 mL | Freq: Once | INTRAVENOUS | Status: AC | PRN
  Administered 2013-12-01: 12 mL via INTRAVENOUS

## 2013-12-01 MED ORDER — FENOFIBRATE 54 MG PO TABS
54.0000 mg | ORAL_TABLET | Freq: Every day | ORAL | Status: DC
  Administered 2013-12-01 – 2013-12-17 (×13): 54 mg via ORAL
  Filled 2013-12-01 (×17): qty 1

## 2013-12-01 MED ORDER — GLUCERNA SHAKE PO LIQD
237.0000 mL | Freq: Two times a day (BID) | ORAL | Status: DC
  Administered 2013-12-01 – 2013-12-15 (×5): 237 mL via ORAL

## 2013-12-01 MED ORDER — HYDROCORTISONE 1 % EX CREA
1.0000 "application " | TOPICAL_CREAM | Freq: Two times a day (BID) | CUTANEOUS | Status: DC | PRN
  Filled 2013-12-01: qty 28

## 2013-12-01 MED ORDER — DEXTROSE 5 % IV SOLN
1.0000 g | Freq: Three times a day (TID) | INTRAVENOUS | Status: DC
  Administered 2013-12-01: 1 g via INTRAVENOUS
  Filled 2013-12-01 (×4): qty 1

## 2013-12-01 MED ORDER — BACITRACIN-NEOMYCIN-POLYMYXIN OINTMENT TUBE
TOPICAL_OINTMENT | Freq: Two times a day (BID) | CUTANEOUS | Status: DC
  Administered 2013-12-01 – 2013-12-02 (×2): via TOPICAL
  Administered 2013-12-02 – 2013-12-03 (×2): 1 via TOPICAL
  Administered 2013-12-03 – 2013-12-08 (×7): via TOPICAL
  Administered 2013-12-10: 1 via TOPICAL
  Administered 2013-12-10 – 2013-12-14 (×9): via TOPICAL
  Administered 2013-12-14 – 2013-12-15 (×2): 1 via TOPICAL
  Administered 2013-12-15 – 2013-12-17 (×4): via TOPICAL
  Filled 2013-12-01 (×2): qty 15

## 2013-12-01 MED ORDER — PANTOPRAZOLE SODIUM 40 MG PO TBEC
40.0000 mg | DELAYED_RELEASE_TABLET | Freq: Two times a day (BID) | ORAL | Status: DC
  Administered 2013-12-01 – 2013-12-03 (×6): 40 mg via ORAL
  Filled 2013-12-01 (×6): qty 1

## 2013-12-01 MED ORDER — ALBUTEROL SULFATE (2.5 MG/3ML) 0.083% IN NEBU
2.5000 mg | INHALATION_SOLUTION | Freq: Four times a day (QID) | RESPIRATORY_TRACT | Status: DC | PRN
  Administered 2013-12-02 – 2013-12-17 (×6): 2.5 mg via RESPIRATORY_TRACT
  Filled 2013-12-01 (×6): qty 3

## 2013-12-01 MED ORDER — VANCOMYCIN HCL IN DEXTROSE 1-5 GM/200ML-% IV SOLN
1000.0000 mg | Freq: Two times a day (BID) | INTRAVENOUS | Status: DC
  Administered 2013-12-01: 1000 mg via INTRAVENOUS
  Filled 2013-12-01 (×2): qty 200
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
## 2013-12-01 NOTE — Consult Note (Signed)
WOC wound consult note Reason for Consult: right foot wound Wound type: trauma, pt has sustained several falls recently per chart Measurement: 0.2cm x 0.2cm x 0 Wound QMV:HQION blister, no open wound  Drainage (amount, consistency, odor) none Periwound:intact Dressing procedure/placement/frequency: Triple antibiotic ointment BID, cover with Band-aid  Discussed POC with patient and bedside nurse.  Re consult if needed, will not follow at this time. Thanks  Chyann Ambrocio Kellogg, [place] [phone])
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
## 2013-12-01 NOTE — Consult Note (Signed)
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 time of interpretation on 12/01/2013 at 10:14 am to Dr. THulen Luster who verbally acknowledged these results.   Electronically Signed   By: ALogan Bores  On: 12/01/2013 10:18   Ct Abdomen Pelvis W Contrast  12/01/2013   CLINICAL DATA:  Malignancy.  Possible intracranial metastases.  EXAM: CT CHEST, ABDOMEN, AND PELVIS WITH CONTRAST  TECHNIQUE: Multidetector CT imaging of the chest, abdomen and pelvis was performed following the standard protocol during bolus administration of intravenous contrast.  CONTRAST:  1034mOMNIPAQUE IOHEXOL 300 MG/ML  SOLN  COMPARISON:  CT scan of chest of September 27, 2013. CT scan of head of November 30, 2013.  FINDINGS: CT CHEST FINDINGS  No pneumothorax or pleural effusion is noted. Airspace opacity is noted in the right middle lobe with air bronchograms most consistent with pneumonia. 28 x 14 mm fluid collection is noted within this area of consolidation containing air bubbles consistent with cavitating pneumonia. 3.6 mm nodule is noted in the left upper  lobe best seen on image number 18 of series 2. Several healed nodular densities are noted in the right lower lobe most likely representing inflammation or infection. Subcarinal adenopathy measuring 2.4 x 1.8 cm is noted. Pretracheal lymph node measuring 15 x 10 mm is noted. Coronary artery calcifications are noted. Thoracic aorta appears normal. Multiple old left upper rib fractures are noted.  CT ABDOMEN AND PELVIS FINDINGS  No gallstones are noted. Mild splenomegaly is noted. The liver and pancreas appear normal. Adrenal glands and kidneys appear normal. Vascular calcifications are noted involving both kidneys. No hydronephrosis or renal obstruction is noted. No definite renal or ureteral calculi are noted. Atherosclerotic calcifications of abdominal aorta and iliac arteries are noted without aneurysm formation. There is no evidence of bowel obstruction. No abnormal fluid collection is noted in the abdomen or pelvis. No significant adenopathy is noted in the abdomen or pelvis.  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  IMPRESSION: Mild splenomegaly.  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 Sigmoidoscopy is recommended for further evaluation.  Enlarged pretracheal and subcarinal adenopathy is noted. It is uncertain if this cysts inflammatory or metastatic in origin.  Area of consolidation is noted in right middle lobe with associated 2.8 cm fluid collection centrally within this abnormality concerning for cavitating pneumonia. Also noted are multiple ill-defined nodular densities in the right lower lobe most consistent with inflammation or pneumonia. Followup CT scan in 2-3 weeks is recommended to ensure resolution an rule out metastatic disease.  3.6 cm well-defined nodule is noted in the left upper lobe. Potentially this may represent metastatic disease if the  patient does have primary malignancy, and continued CT follow-up is recommended.   Electronically Signed   By: Sabino Dick M.D.   On: 12/01/2013 10:41    Review of Systems  Constitutional: Positive for chills, weight loss and malaise/fatigue.  Musculoskeletal: Positive for back pain, joint pain and neck pain.  Neurological: Positive for weakness.   Blood pressure 134/70, pulse 89, temperature 97.6 F (36.4 C), temperature source Oral, resp. rate 20, height '6\' 1"'  (1.854 m), weight 58.4 kg (128 lb 12 oz), SpO2 95 %. Physical Exam  Constitutional:  Cachectic appearing white male. He moves all 4 extremities though weakly. He is alert and oriented. Complains of pain and requests pain medication consistently. Deep tendon reflexes are 1+ in the biceps and triceps 1+ in the brachioradialis. Cranial nerve examination reveals pupils are 3 mm equal round reactive to light and accommodation the extraocular limits are full the tongue and uvula are in the midline sclera and conjunctiva are clear.    Assessment/Plan: Patient has evidence of 3 discrete brain abscesses. From a neurologic standpoint he's been relatively stable.  At this time I would recommend him. Treatment with IV antibiotics. If the patient has a clinical response in these lesions subside then no need for brain biopsy exists. Given the patient's history the diagnosis is not at all in doubt. Treat this man imperically  with antibiotics.  Victorino Fatzinger J 12/01/2013, 7:03 PM

## 2013-12-01 NOTE — Progress Notes (Signed)
UR completed 

## 2013-12-01 NOTE — Plan of Care (Signed)
Problem: Phase I Progression Outcomes Goal: Pain controlled with appropriate interventions Outcome: Progressing Goal: OOB as tolerated unless otherwise ordered Outcome: Progressing Goal: Voiding-avoid urinary catheter unless indicated Outcome: Completed/Met Date Met:  12/01/13 Goal: Hemodynamically stable Outcome: Completed/Met Date Met:  12/01/13

## 2013-12-01 NOTE — Evaluation (Signed)
Physical Therapy Evaluation Patient Details Name: [name] MRN: [id number] DOB: [birth year] Today's Date: 12/01/2013   History of Present Illness  Patient is a 53 yo male admitted 11/30/13 with BLE pain and edema, with multiple falls.  Patient with h/o brain mass, CVA, PVD, COPD, CHF, IV heroin use, Hep C, CHF, DM.  Clinical Impression  Patient presents with problems listed below.  Will benefit from acute PT to maximize independence prior to discharge.  Patient has BLE weakness, decreased balance, decreased safety awareness all impacting functional mobility.  Recommend SNF for continued therapy at discharge.    Follow Up Recommendations SNF;Supervision/Assistance - 24 hour    Equipment Recommendations  Rolling walker with 5" wheels    Recommendations for Other Services       Precautions / Restrictions Precautions Precautions: Fall Precaution Comments: Patient very impulsive with decreased safety awareness Restrictions Weight Bearing Restrictions: No      Mobility  Bed Mobility Overal bed mobility: Needs Assistance Bed Mobility: Supine to Sit;Sit to Supine     Supine to sit: Min assist Sit to supine: Min assist   General bed mobility comments: Verbal cues for technique.  Assist to raise trunk to sitting position.  Required assist to scoot to EOB.  In sitting, patient leaning to left side.  Worked on midline upright sitting balance.  Assist required to bring LE's onto bed to return to supine.  Transfers Overall transfer level: Needs assistance Equipment used: Rolling walker (2 wheeled) Transfers: Sit to/from Stand Sit to Stand: Min assist         General transfer comment: Verbal cues for hand placement and safe use of RW.  Required assist to power up to standing and for balance.  Patient able to march in place 5 times with each foot.  Ambulation/Gait Ambulation/Gait assistance: Min assist;Mod assist Ambulation Distance (Feet): 48 Feet Assistive device: Rolling  walker (2 wheeled) Gait Pattern/deviations: Step-through pattern;Decreased stride length;Shuffle;Ataxic;Staggering left;Staggering right;Narrow base of support Gait velocity: Decreased Gait velocity interpretation: Below normal speed for age/gender General Gait Details: Verbal cues for safe use of RW.  Cues to stay close to RW and stand upright for safety.  Patient pushing RW too far ahead of himself.  He also runs into door jam and foot of bed with RW.  Required assist to maneuver RW to turn back toward room.  Difficulty sequencing/problem solving when facing wall and needing to turn around to return to room.  Stairs            Wheelchair Mobility    Modified Rankin (Stroke Patients Only) Modified Rankin (Stroke Patients Only) Pre-Morbid Rankin Score: No significant disability Modified Rankin: Moderately severe disability     Balance Overall balance assessment: Needs assistance Sitting-balance support: Single extremity supported;Feet supported Sitting balance-Leahy Scale: Fair Sitting balance - Comments: Patient with flexed posture and leaning to left.  Requires assist of UE to maintain upright balance. Postural control: Left lateral lean Standing balance support: Bilateral upper extremity supported Standing balance-Leahy Scale: Poor                               Pertinent Vitals/Pain Pain Assessment: Faces Faces Pain Scale: Hurts even more Pain Location: Bil lower legs and feet Pain Descriptors / Indicators: Aching Pain Intervention(s): Monitored during session;Limited activity within patient's tolerance;Repositioned    Home Living Family/patient expects to be discharged to:: Skilled nursing facility Living Arrangements: Alone (Per patient, with wife in hotel)  Home Equipment: Kasandra Knudsen - single point      Prior Function Level of Independence: Independent with assistive device(s)         Comments: Unclear of patient's abilities pta with  ADL's.     Hand Dominance        Extremity/Trunk Assessment   Upper Extremity Assessment: Generalized weakness           Lower Extremity Assessment: Generalized weakness (RLE weaker than LLE)      Cervical / Trunk Assessment: Kyphotic  Communication   Communication: Expressive difficulties (Difficulty understanding speech.  Difficulty managing saliva)  Cognition Arousal/Alertness: Awake/alert Behavior During Therapy: Impulsive;Flat affect Overall Cognitive Status: No family/caregiver present to determine baseline cognitive functioning (Has decreased safety awareness)                      General Comments      Exercises        Assessment/Plan    PT Assessment Patient needs continued PT services  PT Diagnosis Difficulty walking;Abnormality of gait;Generalized weakness;Acute pain;Altered mental status   PT Problem List Decreased strength;Decreased activity tolerance;Decreased balance;Decreased mobility;Decreased cognition;Decreased knowledge of use of DME;Decreased safety awareness;Pain  PT Treatment Interventions DME instruction;Gait training;Functional mobility training;Therapeutic activities;Therapeutic exercise;Balance training;Cognitive remediation;Patient/family education   PT Goals (Current goals can be found in the Care Plan section) Acute Rehab PT Goals Patient Stated Goal: None stated PT Goal Formulation: With patient Time For Goal Achievement: 12/08/13 Potential to Achieve Goals: Fair    Frequency Min 3X/week   Barriers to discharge Decreased caregiver support Patient reports he lives in hotel.  Per chart patient lives alone.  He reports he lives with his wife.    Co-evaluation               End of Session Equipment Utilized During Treatment: Gait belt Activity Tolerance: Patient limited by pain;Patient limited by fatigue Patient left: in bed;with call bell/phone within reach;with bed alarm set Nurse Communication: Mobility status     Functional Assessment Tool Used: Clinical judgement Functional Limitation: Mobility: Walking and moving around Mobility: Walking and Moving Around Current Status (B2010): At least 20 percent but less than 40 percent impaired, limited or restricted Mobility: Walking and Moving Around Goal Status [phone]): At least 1 percent but less than 20 percent impaired, limited or restricted    Time: 9758-8325 PT Time Calculation (min) (ACUTE ONLY): 17 min   Charges:   PT Evaluation $Initial PT Evaluation Tier I: 1 Procedure PT Treatments $Gait Training: 8-22 mins   PT G Codes:   Functional Assessment Tool Used: Clinical judgement Functional Limitation: Mobility: Walking and moving around    Despina Pole 12/01/2013, 2:22 PM Carita Pian. Sanjuana Kava, [place] Pager [phone]

## 2013-12-01 NOTE — Progress Notes (Signed)
MD notified that pt was complaining of pain 10/10 in legs.  MD wrote order for one time dose of Toradol.  Will continue to monitor.    Fredrich Romans, RN 12/01/2013 5:00AM

## 2013-12-01 NOTE — Clinical Social Work Psychosocial (Addendum)
Clinical Social Work Department BRIEF PSYCHOSOCIAL ASSESSMENT 12/01/2013  Patient:  [name], [name]     Account Number:  [id number]     Admit date:  11/30/2013  Clinical Social Worker:  Marciano Sequin  Date/Time:  12/01/2013 01:29 PM  Referred by:  RN  Date Referred:  12/01/2013 Referred for  Substance Abuse   Other Referral:   Interview type:  Patient Other interview type:    PSYCHOSOCIAL DATA Living Status:  ALONE Admitted from facility:   Level of care:   Primary support name:  Huhta,Jacob Primary support relationship to patient:  CHILD, ADULT Degree of support available:   Limit Support System    CURRENT CONCERNS Current Concerns  Substance Abuse   Other Concerns:    SOCIAL WORK ASSESSMENT / PLAN CSW met the patient at bedside to offer support and discuss patient needs at discharge. Patient stated he currently lives alone. Patient reported wanting sustance abuse resrouces. Patient reported his last use was 2 days ago. Patient reported having body aches. Patient reported that he would use again just to stop his withdrawal symptoms. CSW and patient discussed treatment programs in the community. Patient reported he will not take suboxone, but is open to methadone. CSW provided the patient with [facility] contact information.   Assessment/plan status:  No Further Intervention Required Other assessment/ plan:  SBIRT complete Information/referral to community resources:   [facility] and 6 additional community treatment agencies    PATIENT'S/FAMILY'S RESPONSE TO PLAN OF CARE: Pt presented with pressured speech. Pt reported wanting to change his life style. Pt identified that the change process will be a diffcult one from him.   [place], MSW, [place]

## 2013-12-01 NOTE — Progress Notes (Signed)
Subjective: Complains of feeling cold. Denies HA. States tylenoll does not help w/ leg pain and he feels ready to "check out" and get drugs off the street if he does not get any stronger pain meds.   Objective: Vital signs in last 24 hours: Filed Vitals:   12/01/13 0023 12/01/13 0025 12/01/13 0118 12/01/13 0529  BP: 124/51  100/89 142/71  Pulse: 97  96 73  Temp:  101.2 F (38.4 C) 97.6 F (36.4 C) 98.2 F (36.8 C)  TempSrc:  Oral Oral Other (Comment)  Resp: 15  16 18   Height:   6\' 1"  (1.854 m)   Weight:   128 lb 12 oz (58.4 kg)   SpO2: 98%  98% 100%   Weight change:   Intake/Output Summary (Last 24 hours) at 12/01/13 1148 Last data filed at 12/01/13 0658  Gross per 24 hour  Intake      0 ml  Output   1200 ml  Net  -1200 ml   PE General: NAD HEENT: missing upper teeth, poor dentition on lower teeth Lungs: CTAB, no wheezes Cardiac: RRR, no murmurs GI: active bowel sounds, soft, scaphoid abdomen  Ext: bruise rt pinky toes, LLE erythematous and warm >>RLE. Intact sensation. Questionable janeway lesion on 2 rt toe nail vs bruise beneath toe nail. Able to bend legs, unable to lift rt leg off bed at hips  Lab Results: Basic Metabolic Panel:  Recent Labs Lab 11/30/13 2216 12/01/13 0414  NA 134* 139  K 4.2 3.6*  CL 95* 102  CO2 24 25  GLUCOSE 109* 97  BUN 10 11  CREATININE 0.65 0.68  CALCIUM 9.1 8.5  MG  --  2.0   Liver Function Tests:  Recent Labs Lab 11/30/13 2216 12/01/13 0414  AST 82* 60*  ALT 40 34  ALKPHOS 77 73  BILITOT 0.4 0.4  PROT 8.8* 7.6  ALBUMIN 2.6* 2.4*   CBC:  Recent Labs Lab 11/30/13 2216 12/01/13 0414  WBC 10.3 10.4  NEUTROABS 7.8* 6.9  HGB 12.5* 10.5*  HCT 38.3* 32.8*  MCV 81.3 81.0  PLT 434* 375   Cardiac Enzymes:  Recent Labs Lab 11/30/13 2216  TROPONINI <0.30   BNP:  Recent Labs Lab 11/30/13 2216  PROBNP 397.8*   CBG:  Recent Labs Lab 11/30/13 2053 12/01/13 0648 12/01/13 1122  GLUCAP 96 84 90    Urine Drug Screen: Drugs of Abuse     Component Value Date/Time   LABOPIA POSITIVE* 12/01/2013 0533   LABOPIA NEG 06/01/2011 1115   COCAINSCRNUR NONE DETECTED 12/01/2013 0533   COCAINSCRNUR NEG 06/01/2011 1115   LABBENZ NONE DETECTED 12/01/2013 0533   LABBENZ NEG 06/01/2011 1115   LABBENZ NEG 02/18/2009 2050   AMPHETMU NONE DETECTED 12/01/2013 0533   AMPHETMU NEG 06/01/2011 1115   AMPHETMU NEG 02/18/2009 2050   THCU NONE DETECTED 12/01/2013 0533   THCU NEG 06/01/2011 1115   LABBARB NONE DETECTED 12/01/2013 0533   LABBARB NEG 06/01/2011 1115    Alcohol Level:  Recent Labs Lab 11/30/13 2216  ETH <11   Urinalysis:  Recent Labs Lab 11/30/13 2320  COLORURINE AMBER*  LABSPEC 1.018  PHURINE 8.0  GLUCOSEU NEGATIVE  HGBUR NEGATIVE  BILIRUBINUR NEGATIVE  KETONESUR NEGATIVE  PROTEINUR 30*  UROBILINOGEN 4.0*  NITRITE NEGATIVE  LEUKOCYTESUR NEGATIVE    Micro Results: Recent Results (from the past 240 hour(s))  Culture, expectorated sputum-assessment     Status: None   Collection Time: 12/01/13  4:33 AM  Result Value Ref  Range Status   Specimen Description SPUTUM  Final   Special Requests NONE  Final   Sputum evaluation   Final    MICROSCOPIC FINDINGS SUGGEST THAT THIS SPECIMEN IS NOT REPRESENTATIVE OF LOWER RESPIRATORY SECRETIONS. PLEASE RECOLLECT. CALLED TO [place] [phone] 12/01/13 E.GADDY    Report Status 12/01/2013 FINAL  Final   Studies/Results: Dg Chest 2 View  11/30/2013   CLINICAL DATA:  Fall.  Cough.  EXAM: CHEST  2 VIEW  COMPARISON:  10/27/2013  FINDINGS: Persistent opacity in the right lung base appears to be increasing in size since previous study. This may represent progression of pneumonia. Follow-up until resolution is recommended to exclude underlying obstructing lesion. Normal heart size and pulmonary vascularity. No blunting of costophrenic angles. No pneumothorax. Old fracture deformities of left upper ribs. Postoperative changes in the cervical  spine. Degenerative changes in the thoracic spine.  IMPRESSION: Persistent right middle lung pneumonia. Follow-up until resolution is recommended. No new abnormalities since prior study.   Electronically Signed   By: Lucienne Capers M.D.   On: 11/30/2013 21:59   Dg Pelvis 1-2 Views  11/30/2013   CLINICAL DATA:  LEFT hip pain, tripped and fell today  EXAM: PELVIS - 1-2 VIEW  COMPARISON:  None  FINDINGS: Osseous mineralization grossly normal.  Hip and SI joint spaces preserved.  Patient rotated to the LEFT.  No acute fracture, dislocation or bone destruction.  Surgical clips at RIGHT inguinal region.  Scattered atherosclerotic calcifications.  IMPRESSION: No acute osseous abnormalities.   Electronically Signed   By: Lavonia Dana M.D.   On: 11/30/2013 21:58   Ct Head Wo Contrast  11/30/2013   CLINICAL DATA:  [place] today in bathroom, struck back of head on toilet. No loss of consciousness.  EXAM: CT HEAD WITHOUT CONTRAST  CT CERVICAL SPINE WITHOUT CONTRAST  TECHNIQUE: Multidetector CT imaging of the head and cervical spine was performed following the standard protocol without intravenous contrast. Multiplanar CT image reconstructions of the cervical spine were also generated.  COMPARISON:  CT of the head January 26, 2012  FINDINGS: CT HEAD FINDINGS  No intraparenchymal hemorrhage or acute large vascular territory infarct. However, there are new predominately isodense masses within the supratentorial brain including 2.6 x 2.7 cm RIGHT basal ganglia, 1.8 x 1.9 cm RIGHT corpus callosum and 1.9 x 1.9 cm LEFT frontal convexity masses. Surrounding low-density vasogenic edema, including partial effacement of the lateral aspect of the frontal horn of the RIGHT lateral ventricle without hydrocephalus. No midline shift.  No abnormal extra-axial fluid collections. Moderate calcific atherosclerosis of the carotid siphons.  Small LEFT frontal/supraorbital scalp hematoma. No skull fracture. Mild paranasal sinus mucosal  thickening without air-fluid levels. Ocular globes and orbital contents are unremarkable.Soft tissue within the external auditory canal likely reflects cerumen.  CT CERVICAL SPINE FINDINGS  Cervical vertebral bodies and posterior elements are intact and aligned and maintenance of cervical lordosis. C4-5 ACDF with solid interbody arthrodesis. Hardware appears intact and well seated without periprosthetic lucency. Mild C3-4 and C5-6 degenerative disc. C1-2 articulation maintained with arthropathy. Moderate calcific atherosclerosis of the carotid siphons.  IMPRESSION: CT HEAD: Small LEFT frontal scalp hematoma. No skull fracture. No acute intracranial process.  At least 3 supratentorial masses, with imaging characteristics concerning for metastasis, though these may reflect subacute hematomas, it would be atypical for all to the of identical chronicity. No midline shift. Recommend MRI of the brain with contrast.  CT CERVICAL SPINE: No acute fracture nor malalignment.  Status post C4-5  ACDF with solid fusion.  Acute findings discussed with and reconfirmed by Dr.FORREST HARRISON on 11/30/2013 at 10:23 pm.   Electronically Signed   By: Elon Alas   On: 11/30/2013 22:24   Ct Chest W Contrast  12/01/2013   CLINICAL DATA:  Malignancy.  Possible intracranial metastases.  EXAM: CT CHEST, ABDOMEN, AND PELVIS WITH CONTRAST  TECHNIQUE: Multidetector CT imaging of the chest, abdomen and pelvis was performed following the standard protocol during bolus administration of intravenous contrast.  CONTRAST:  159mL OMNIPAQUE IOHEXOL 300 MG/ML  SOLN  COMPARISON:  CT scan of chest of September 27, 2013. CT scan of head of November 30, 2013.  FINDINGS: CT CHEST FINDINGS  No pneumothorax or pleural effusion is noted. Airspace opacity is noted in the right middle lobe with air bronchograms most consistent with pneumonia. 28 x 14 mm fluid collection is noted within this area of consolidation containing air bubbles consistent with  cavitating pneumonia. 3.6 mm nodule is noted in the left upper lobe best seen on image number 18 of series 2. Several healed nodular densities are noted in the right lower lobe most likely representing inflammation or infection. Subcarinal adenopathy measuring 2.4 x 1.8 cm is noted. Pretracheal lymph node measuring 15 x 10 mm is noted. Coronary artery calcifications are noted. Thoracic aorta appears normal. Multiple old left upper rib fractures are noted.  CT ABDOMEN AND PELVIS FINDINGS  No gallstones are noted. Mild splenomegaly is noted. The liver and pancreas appear normal. Adrenal glands and kidneys appear normal. Vascular calcifications are noted involving both kidneys. No hydronephrosis or renal obstruction is noted. No definite renal or ureteral calculi are noted. Atherosclerotic calcifications of abdominal aorta and iliac arteries are noted without aneurysm formation. There is no evidence of bowel obstruction. No abnormal fluid collection is noted in the abdomen or pelvis. No significant adenopathy is noted in the abdomen or pelvis.  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  IMPRESSION: Mild splenomegaly.  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 Sigmoidoscopy is recommended for further evaluation.  Enlarged pretracheal and subcarinal adenopathy is noted. It is uncertain if this cysts inflammatory or metastatic in origin.  Area of consolidation is noted in right middle lobe with associated 2.8 cm fluid collection centrally within this abnormality concerning for cavitating pneumonia. Also noted are multiple ill-defined nodular densities in the right lower lobe most consistent with inflammation or pneumonia. Followup CT scan in 2-3 weeks is recommended to ensure resolution an rule out metastatic disease.  3.6 cm well-defined nodule is noted in the left upper lobe.  Potentially this may represent metastatic disease if the patient does have primary malignancy, and continued CT follow-up is recommended.   Electronically Signed   By: Sabino Dick M.D.   On: 12/01/2013 10:41   Ct Cervical Spine Wo Contrast  11/30/2013   CLINICAL DATA:  [place] today in bathroom, struck back of head on toilet. No loss of consciousness.  EXAM: CT HEAD WITHOUT CONTRAST  CT CERVICAL SPINE WITHOUT CONTRAST  TECHNIQUE: Multidetector CT imaging of the head and cervical spine was performed following the standard protocol without intravenous contrast. Multiplanar CT image reconstructions of the cervical spine were also generated.  COMPARISON:  CT of the head January 26, 2012  FINDINGS: CT HEAD FINDINGS  No intraparenchymal hemorrhage or acute large vascular territory infarct. However, there are new predominately isodense masses  within the supratentorial brain including 2.6 x 2.7 cm RIGHT basal ganglia, 1.8 x 1.9 cm RIGHT corpus callosum and 1.9 x 1.9 cm LEFT frontal convexity masses. Surrounding low-density vasogenic edema, including partial effacement of the lateral aspect of the frontal horn of the RIGHT lateral ventricle without hydrocephalus. No midline shift.  No abnormal extra-axial fluid collections. Moderate calcific atherosclerosis of the carotid siphons.  Small LEFT frontal/supraorbital scalp hematoma. No skull fracture. Mild paranasal sinus mucosal thickening without air-fluid levels. Ocular globes and orbital contents are unremarkable.Soft tissue within the external auditory canal likely reflects cerumen.  CT CERVICAL SPINE FINDINGS  Cervical vertebral bodies and posterior elements are intact and aligned and maintenance of cervical lordosis. C4-5 ACDF with solid interbody arthrodesis. Hardware appears intact and well seated without periprosthetic lucency. Mild C3-4 and C5-6 degenerative disc. C1-2 articulation maintained with arthropathy. Moderate calcific atherosclerosis of the carotid  siphons.  IMPRESSION: CT HEAD: Small LEFT frontal scalp hematoma. No skull fracture. No acute intracranial process.  At least 3 supratentorial masses, with imaging characteristics concerning for metastasis, though these may reflect subacute hematomas, it would be atypical for all to the of identical chronicity. No midline shift. Recommend MRI of the brain with contrast.  CT CERVICAL SPINE: No acute fracture nor malalignment.  Status post C4-5 ACDF with solid fusion.  Acute findings discussed with and reconfirmed by Dr.FORREST HARRISON on 11/30/2013 at 10:23 pm.   Electronically Signed   By: Elon Alas   On: 11/30/2013 22:24   Mr Brain W Wo Contrast  12/01/2013   CLINICAL DATA:  Worsening lower extremity pain and edema with falls over the past 2 days. Left frontal head injury with 1 of the falls. Weight loss. IV drug abuse.  EXAM: MRI HEAD WITHOUT AND WITH CONTRAST  TECHNIQUE: Multiplanar, multiecho pulse sequences of the brain and surrounding structures were obtained without and with intravenous contrast.  CONTRAST:  45mL MULTIHANCE GADOBENATE DIMEGLUMINE 529 MG/ML IV SOLN  COMPARISON:  Head CT 11/30/2013 and MRI 10/21/2011  FINDINGS: There is no evidence of acute infarct or extra-axial fluid collection. Ring-enhancing masses demonstrate restricted diffusion and measured 2.4 x 2.1 cm in the posterior left frontal lobe, 2.3 x 2.1 cm in the right frontal centrum semiovale, and 2.7 x 2.6 cm at the level of the right basal ganglia. A small amount of susceptibility artifact within these lesions, most prominently in the right basal ganglia lesion, is compatible with a small amount of associated blood products.  There is mild to moderate vasogenic edema surrounding these lesions. Minimal leftward midline shift measures 3-4 mm. There is partial effacement of the frontal horn of the right lateral ventricle. There is no evidence of hydrocephalus. Chronic bilateral basal ganglia lacunar infarcts are again noted.  Chronic cortical infarct involving the left insula and left frontal operculum is again seen. There is mild generalized cerebral atrophy. Scattered, small foci of T2 hyperintensity in the cerebral white matter are nonspecific but may reflect mild chronic small vessel ischemic disease.  Orbits are unremarkable. Paranasal sinuses and mastoid air cells are clear. 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  IMPRESSION: Three ring-enhancing masses with mild to moderate surrounding edema, highly concerning for cerebral abscesses. Necrotic metastases are an additional but less favored consideration.  Critical Value/emergent results were called by telephone at the time of interpretation on 12/01/2013 at 10:14 am to Dr. Hulen Luster, who verbally acknowledged these results.   Electronically Signed   By: Logan Bores   On: 12/01/2013 10:18  Ct Abdomen Pelvis W Contrast  12/01/2013   CLINICAL DATA:  Malignancy.  Possible intracranial metastases.  EXAM: CT CHEST, ABDOMEN, AND PELVIS WITH CONTRAST  TECHNIQUE: Multidetector CT imaging of the chest, abdomen and pelvis was performed following the standard protocol during bolus administration of intravenous contrast.  CONTRAST:  142mL OMNIPAQUE IOHEXOL 300 MG/ML  SOLN  COMPARISON:  CT scan of chest of September 27, 2013. CT scan of head of November 30, 2013.  FINDINGS: CT CHEST FINDINGS  No pneumothorax or pleural effusion is noted. Airspace opacity is noted in the right middle lobe with air bronchograms most consistent with pneumonia. 28 x 14 mm fluid collection is noted within this area of consolidation containing air bubbles consistent with cavitating pneumonia. 3.6 mm nodule is noted in the left upper lobe best seen on image number 18 of series 2. Several healed nodular densities are noted in the right lower lobe most likely representing inflammation or infection. Subcarinal adenopathy measuring 2.4 x 1.8 cm is noted. Pretracheal lymph node measuring 15 x 10  mm is noted. Coronary artery calcifications are noted. Thoracic aorta appears normal. Multiple old left upper rib fractures are noted.  CT ABDOMEN AND PELVIS FINDINGS  No gallstones are noted. Mild splenomegaly is noted. The liver and pancreas appear normal. Adrenal glands and kidneys appear normal. Vascular calcifications are noted involving both kidneys. No hydronephrosis or renal obstruction is noted. No definite renal or ureteral calculi are noted. Atherosclerotic calcifications of abdominal aorta and iliac arteries are noted without aneurysm formation. There is no evidence of bowel obstruction. No abnormal fluid collection is noted in the abdomen or pelvis. No significant adenopathy is noted in the abdomen or pelvis.  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  IMPRESSION: Mild splenomegaly.  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 Sigmoidoscopy is recommended for further evaluation.  Enlarged pretracheal and subcarinal adenopathy is noted. It is uncertain if this cysts inflammatory or metastatic in origin.  Area of consolidation is noted in right middle lobe with associated 2.8 cm fluid collection centrally within this abnormality concerning for cavitating pneumonia. Also noted are multiple ill-defined nodular densities in the right lower lobe most consistent with inflammation or pneumonia. Followup CT scan in 2-3 weeks is recommended to ensure resolution an rule out metastatic disease.  3.6 cm well-defined nodule is noted in the left upper lobe. Potentially this may represent metastatic disease if the patient does have primary malignancy, and continued CT follow-up is recommended.   Electronically Signed   By: Sabino Dick M.D.   On: 12/01/2013 10:41   Medications: I have reviewed the patient's current medications. Scheduled Meds: . enoxaparin (LOVENOX)  injection  40 mg Subcutaneous Q24H  . famotidine  10 mg Oral BID  . fenofibrate  54 mg Oral Daily  . [START ON 12/02/2013] Influenza vac split quadrivalent PF  0.5 mL Intramuscular Tomorrow-1000  . mometasone-formoterol  2 puff Inhalation BID  . multivitamin with minerals  1 tablet Oral Daily  . neomycin-bacitracin-polymyxin   Topical BID  . nicotine  21 mg Transdermal Q24H  . pantoprazole  40 mg Oral BID   Continuous Infusions:  PRN Meds:.acetaminophen, albuterol, diclofenac sodium, hydrocortisone cream, hydrOXYzine, iohexol, ipratropium-albuterol, morphine injection Assessment/Plan: Principal Problem:   Brain mass Active Problems:   Dyslipidemia   Anxiety state   HYPERTENSION, BENIGN ESSENTIAL   Peripheral vascular disease  COPD (chronic obstructive pulmonary disease)   Tobacco abuse   Abnormality of gait   Loss of weight   Malnutrition   Pneumonia   Heroin abuse   Supratentorial Masses on CT: ED physician spoke with neurosurgeon, Dr. Ronnald Ramp, who recommended MRI without need for acute intervention. The masses could represent metastasis, subacute hematomas or septic emboli.  - MRI brain w/ and w/o revealed 3 abscesses, consulted Dr. Baxter Flattery with ID who recommended to d/c abx (vanc and cefepime) and to get neurosx involved for possible biopsy. Possible that brain lesions are not acute as pt has no neurologic complaints. LP contradicted 2/2 to mass effect of lesions.  - neurosx consulted and will see pt today - CT abdomen/pelvis and chest revealed possible apple core lesion and malignancy in the rectum, enlarged pretracheal and subcarinal adenopathy 2/2 inflammatory cysts vs metastasis, 2.8cm fluid collection in rt middle lobe concerning for cavitating PNA, multiple nodular densities in RUL PNA vs inflammation, 3.6cm well defined nodule in LUL concerning for metastatic disease. CT f/u recommended.  - TEE ordered 2/2 IVDU, blood cultures pending.  - Neuro checks  Metastatic disease  vs Pneumonia: Was treated for PNA in 09/2013 with azithromycin, then had infiltrative finding on CXR in 10/2013. Has persistent infiltrate in RML suggestive of pneumonia today. Has remote history of TB. - CT chest results noted above, likely pt has metastatic disease, will continue to work up - abx held as noted above  - Sputum culture needs to be recollected.   Lower Extremity pain: This was first noted in clinic notes in 09/2013; perhaps related to early right-sided heart failure that is a consequence of his progressive emphysema.  - one time IV toradol given for breakthrough pain which did not help, morphine 2mg  q4h scheduled for pain - Lower extremity dopplers ordered  Fever: 101.3 deg F on admission, afebrile since then - Tylenol for fever and pain - Sputum, blood and urine cultures pending  COPD: Progressive emphysema; smoking history of 50 pack years. Had recent PFTs. Uses combivent and advair. - Continue home medications  Anxiety: May be contributing to his dyspnea, as per prior clinic notes. Has taken klonopin in the past; no longer written for klonopin by IMT[facility]. Obtained new rx from a psychiatrist affiliated with his therapist. - Continue klonopin to avoid withdrawal  DMII: Stable and well-controlled on metformin. Hold metformin can start sliding scale sensitive if CBGs elevated - CBGs 84-90, continue CBG checks q4  Essential Hypertension: Currently normotensive. -hold home meds  Dyslipidemia: Most recent panel 02/2012; Chol 165, Trig 120, HDL 37, LDL 104, VLDL 24 - Continue home fenofibrate  Polysubstance Abuse: 50 pack year smoker who started using IV heroin about a year ago when his pain medications were not renewed. States that he does not share needles. Patient admits to heroin use yesterday. He does not appear to be in withdrawal (which peaks 30-56 hours after last use of heroin); he has mydriasis, but normal VS, no piloerection, no diaphoresis, no vomiting/diarrhea. -  Started nicotine patch in 09/2013, but patient has not been using it. Resumed in hospital. - last heroin use yesterday, uses 2-4 bags per day. Morphine 2mg  q4h for withdrawal, can increase to q2h if needed. Currently no in withdrawal - CSW; patient is interested in quitting heroin - HIV negative  DVT Ppx:  - Lovenox  Diet:  - Carb mod  Dispo: Disposition is deferred at this time, awaiting improvement of current medical problems. Anticipated discharge in approximately 5 day(s).  The patient does have a current PCP (Kelby Aline, MD) and does need an [facility] hospital follow-up appointment after discharge.  The patient does have transportation limitations that hinder transportation to clinic appointments.  .Services Needed at time of discharge: Y = Yes, Blank = No PT:   OT:   RN:   Equipment:   Other:     LOS: 1 day   Julious Oka, MD 12/01/2013, 11:48 AM

## 2013-12-01 NOTE — ED Notes (Signed)
Below order not completed by EW. 

## 2013-12-01 NOTE — Consult Note (Signed)
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 ring-enhancing masses with mild to moderate surrounding edema, highly concerning for cerebral abscesses. Necrotic metastases are an additional but less favored consideration.  Critical Value/emergent results were called by telephone at the time of interpretation on 12/01/2013 at 10:14 am to Dr. Hulen Luster, who verbally acknowledged these results.   Electronically Signed   By: Logan Bores   On: 12/01/2013 10:18   Ct Abdomen Pelvis W Contrast  12/01/2013   CLINICAL DATA:  Malignancy.  Possible intracranial metastases.  EXAM: CT CHEST, ABDOMEN, AND PELVIS WITH CONTRAST  TECHNIQUE: Multidetector CT imaging of the chest, abdomen and pelvis was performed following the standard protocol during bolus administration of intravenous contrast.  CONTRAST:  178mL OMNIPAQUE IOHEXOL 300 MG/ML  SOLN  COMPARISON:  CT scan of chest of September 27, 2013. CT scan of head of November 30, 2013.  FINDINGS: CT CHEST FINDINGS  No pneumothorax or pleural effusion is noted. Airspace opacity is noted in the right middle lobe with air bronchograms most consistent with pneumonia. 28 x 14 mm fluid collection is noted within this area of consolidation containing air bubbles consistent with cavitating pneumonia. 3.6 mm nodule is noted in the left  upper lobe best seen on image number 18 of series 2. Several healed nodular densities are noted in the right lower lobe most likely representing inflammation or infection. Subcarinal adenopathy measuring 2.4 x 1.8 cm is noted. Pretracheal lymph node measuring 15 x 10 mm is noted. Coronary artery calcifications are noted. Thoracic aorta appears normal. Multiple old left upper rib fractures are noted.  CT ABDOMEN AND PELVIS FINDINGS  No gallstones are noted. Mild splenomegaly is noted. The liver and pancreas appear normal. Adrenal glands and kidneys appear normal. Vascular calcifications are noted involving both kidneys. No hydronephrosis or renal obstruction is noted. No definite renal or ureteral calculi are noted. Atherosclerotic calcifications of abdominal aorta and iliac arteries are noted without aneurysm formation. There is no evidence of bowel obstruction. No abnormal fluid collection is noted in the abdomen or pelvis. No significant adenopathy is noted in the abdomen or pelvis.  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  IMPRESSION: Mild splenomegaly.  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 Sigmoidoscopy is recommended for further evaluation.  Enlarged pretracheal and subcarinal adenopathy is noted. It is uncertain if this cysts inflammatory or metastatic in origin.  Area of consolidation is noted in right middle lobe with associated 2.8 cm fluid collection centrally within this abnormality concerning for cavitating pneumonia. Also noted are multiple ill-defined nodular densities in the right lower lobe most consistent with inflammation or pneumonia. Followup CT scan in 2-3 weeks is recommended to ensure resolution an rule out metastatic disease.  3.6 cm well-defined nodule is noted in the left upper lobe. Potentially this may represent metastatic disease if the  patient does have primary malignancy, and continued CT follow-up is recommended.   Electronically Signed   By: Sabino Dick M.D.   On: 12/01/2013 10:41    IMPRESSION/PPLAN: 53 year old male  with a complicated medical history admitted today with lower extremity swelling, pain. CT abdomen revealed a rectal mass concerning for malignancy especially in light of his recent weight loss and rectal bleeding. Patient's Plavix and aspirin were held on admission. He will need colonoscopy for further evaluation and we will review this with Dr. Olevia Perches as to when this can be scheduled. Currently on Lovenox for DVT prophylaxis  Pneumonia versus metastatic disease. He was treated in September with azithromycin and then had an infiltrative finding on chest x-ray in October he has a persistent right middle lobe infiltrate suggestive of pneumonia today CT chest results above and this will be worked up by the medical service  Fever. 101.3 on admission. Sputum blood and urine cultures are pending  Polysubstance abuse patient has been using IV heroin because he says his pain medications were not renewed he says he does not share needles. He admits to heroin use yesterday. He also has a 50-pack-year smoker. He is HIV negative. He is currently on morphine as needed for withdrawal but currently does not appear to be withdrawn    Hvozdovic, Deloris Ping 12/01/2013,  Pager [phone] Attending MD note:   I have taken a history, examined the patient, and reviewed the chart. I think flexible sigmoidoscopy would be sufficient to clarify abnormality on CT scan. We could do that in next 24 hours to r/o rectal cancer diagnosis which would influence further medical  Decisions in his care. Will consider flex sigm on Sunday 12/03/2013, limited prep.   [facility] Pager # [phone]

## 2013-12-01 NOTE — Discharge Instructions (Signed)
Metformin and X-ray Contrast Studies °For some X-ray exams, a contrast dye is used. Contrast dye is a type of medicine used to make the X-ray image clearer. The contrast dye is given to the patient through a vein (intravenously). If you need to have this type of X-ray exam and you take a medication called metformin, your caregiver may have you stop taking metformin before the exam.  °LACTIC ACIDOSIS °In rare cases, a serious medical condition called lactic acidosis can develop in people who take metformin and receive contrast dye. The following conditions can increase the risk of this complication:  °· Kidney failure. °· Liver problems. °· Certain types of heart problems such as: °¨ Heart failure. °¨ Heart attack. °¨ Heart infection. °¨ Heart valve problems. °· Alcohol abuse. °If left untreated, lactic acidosis can lead to coma.  °SYMPTOMS OF LACTIC ACIDOSIS °Symptoms of lactic acidosis can include: °· Rapid breathing (hyperventilation). °· Neurologic symptoms such as: °¨ Headaches. °¨ Confusion. °¨ Dizziness. °· Excessive sweating. °· Feeling sick to your stomach (nauseous) or throwing up (vomiting). °AFTER THE X-RAY EXAM °· Stay well-hydrated. Drink fluids as instructed by your caregiver. °· If you have a risk of developing lactic acidosis, blood tests may be done to make sure your kidney function is okay. °· Metformin is usually stopped for 48 hours after the X-ray exam. Ask your caregiver when you can start taking metformin again. °SEEK MEDICAL CARE IF:  °· You have shortness of breath or difficulty breathing. °· You develop a headache that does not go away. °· You have nausea or vomiting. °· You urinate more than normal. °· You develop a skin rash and have: °¨ Redness. °¨ Swelling. °¨ Itching. °Document Released: 12/17/2008 Document Revised: 03/23/2011 Document Reviewed: 12/17/2008 °[employer]® Patient Information ©2015 [employer]. This information is not intended to replace advice given to you by your health  care provider. Make sure you discuss any questions you have with your health care provider. ° °

## 2013-12-01 NOTE — Consult Note (Signed)
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 frontal/supraorbital scalp hematoma. No skull fracture. Mild paranasal sinus mucosal thickening without air-fluid levels. Ocular globes and orbital contents are unremarkable.Soft tissue within the external auditory canal likely reflects cerumen.  CT CERVICAL SPINE FINDINGS  Cervical vertebral bodies and posterior elements are intact and aligned and maintenance of cervical lordosis. C4-5 ACDF with solid interbody arthrodesis. Hardware appears intact and well seated without periprosthetic lucency. Mild C3-4 and C5-6 degenerative disc. C1-2 articulation maintained with arthropathy. Moderate calcific atherosclerosis of the carotid siphons.  IMPRESSION: CT HEAD: Small LEFT frontal scalp hematoma. No skull fracture. No acute intracranial process.  At least 3 supratentorial masses, with imaging characteristics concerning for metastasis, though these may reflect subacute hematomas, it would be atypical for all to the  of identical chronicity. No midline shift. Recommend MRI of the brain with contrast.  CT CERVICAL SPINE: No acute fracture nor malalignment.  Status post C4-5 ACDF with solid fusion.  Acute findings discussed with and reconfirmed by Dr.FORREST HARRISON on 11/30/2013 at 10:23 pm.   Electronically Signed   By: Elon Alas   On: 11/30/2013 22:24   Mr Brain W Wo Contrast  12/01/2013   CLINICAL DATA:  Worsening lower extremity pain and edema with falls over the past 2 days. Left frontal head injury with 1 of the falls. Weight loss. IV drug abuse.  EXAM:  MRI HEAD WITHOUT AND WITH CONTRAST  TECHNIQUE: Multiplanar, multiecho pulse sequences of the brain and surrounding structures were obtained without and with intravenous contrast.  CONTRAST:  43m MULTIHANCE GADOBENATE DIMEGLUMINE 529 MG/ML IV SOLN  COMPARISON:  Head CT 11/30/2013 and MRI 10/21/2011  FINDINGS: There is no evidence of acute infarct or extra-axial fluid collection. Ring-enhancing masses demonstrate restricted diffusion and measured 2.4 x 2.1 cm in the posterior left frontal lobe, 2.3 x 2.1 cm in the right frontal centrum semiovale, and 2.7 x 2.6 cm at the level of the right basal ganglia. A small amount of susceptibility artifact within these lesions, most prominently in the right basal ganglia lesion, is compatible with a small amount of associated blood products.  There is mild to moderate vasogenic edema surrounding these lesions. Minimal leftward midline shift measures 3-4 mm. There is partial effacement of the frontal horn of the right lateral ventricle. There is no evidence of hydrocephalus. Chronic bilateral basal ganglia lacunar infarcts are again noted. Chronic cortical infarct involving the left insula and left frontal operculum is again seen. There is mild generalized cerebral atrophy. Scattered, small foci of T2 hyperintensity in the cerebral white matter are nonspecific but may reflect mild chronic small vessel ischemic disease.   Orbits are unremarkable. Paranasal sinuses and mastoid air cells are clear. 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  IMPRESSION: Three ring-enhancing masses with mild to moderate surrounding edema, highly concerning for cerebral abscesses. Necrotic metastases are an additional but less favored consideration.  Critical Value/emergent results were called by telephone at the time of interpretation on 12/01/2013 at 10:14 am to Dr. THulen Luster who verbally acknowledged these results.   Electronically Signed   By: ALogan Bores  On: 12/01/2013 10:18   Ct Abdomen Pelvis W Contrast  12/01/2013   CLINICAL DATA:  Malignancy.  Possible intracranial metastases.  EXAM: CT CHEST, ABDOMEN, AND PELVIS WITH CONTRAST  TECHNIQUE: Multidetector CT imaging of the chest, abdomen and pelvis was performed following the standard protocol during bolus administration of intravenous contrast.  CONTRAST:  1017mOMNIPAQUE IOHEXOL 300 MG/ML  SOLN  COMPARISON:  CT scan of chest of September 27, 2013. CT scan of head of November 30, 2013.  FINDINGS: CT CHEST FINDINGS  No pneumothorax or pleural effusion is noted. Airspace opacity is noted in the right middle lobe with air bronchograms most consistent with pneumonia. 28 x 14 mm fluid collection is noted within this area of consolidation containing air bubbles consistent with cavitating pneumonia. 3.6 mm nodule is noted in the left upper lobe best seen on image number 18 of series 2. Several healed nodular densities are noted in the right lower lobe most likely representing inflammation or infection. Subcarinal adenopathy measuring 2.4 x 1.8 cm is noted. Pretracheal lymph node measuring 15 x 10 mm is noted. Coronary artery calcifications are noted. Thoracic aorta appears normal. Multiple old left upper rib fractures are noted.  CT ABDOMEN AND PELVIS FINDINGS  No gallstones are noted. Mild splenomegaly is noted. The liver and pancreas appear normal. Adrenal glands and kidneys  appear normal. Vascular calcifications are noted involving both kidneys. No hydronephrosis or renal obstruction is noted. No definite renal or ureteral calculi are noted. Atherosclerotic calcifications of abdominal aorta and iliac arteries are noted without aneurysm formation. There is no evidence of bowel obstruction. No abnormal fluid collection is noted in the abdomen or pelvis. No significant adenopathy is noted in the abdomen or pelvis.  There is a focal area of  narrowing involving the rectum concerning for "apple-core" lesion and malignancy.  IMPRESSION: Mild splenomegaly.  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 Sigmoidoscopy is recommended for further evaluation.  Enlarged pretracheal and subcarinal adenopathy is noted. It is uncertain if this cysts inflammatory or metastatic in origin.  Area of consolidation is noted in right middle lobe with associated 2.8 cm fluid collection centrally within this abnormality concerning for cavitating pneumonia. Also noted are multiple ill-defined nodular densities in the right lower lobe most consistent with inflammation or pneumonia. Followup CT scan in 2-3 weeks is recommended to ensure resolution an rule out metastatic disease.  3.6 cm well-defined nodule is noted in the left upper lobe. Potentially this may represent metastatic disease if the patient does have primary malignancy, and continued CT follow-up is recommended.   Electronically Signed   By: Sabino Dick M.D.   On: 12/01/2013 10:41   Assessment/Plan:  53yo M with IVDU and multiple medical morbidities found to have brain mass ( concern for brain abscess vs. Metastatic disease), pulmonary cavitary lesion, possible abscess. And also rectal lesion, concerning for malignancy in setting of weight loss.  - recommend to stop antibiotics so that we can ask neurosurgery to sample brain lesion via radiotactic sampling for micro plus  path/cytology - may need GI consult to get sigmoidscopy for rectal lesion biopsy - will provide more recs as more data returns - once brain lesion is sampled, can start empiric regimen for vanco, ceftriaxone, metronidazole   [facility] [phone]

## 2013-12-01 NOTE — Progress Notes (Signed)
Pt transferred from [facility] ED to room 4N30.  Pt is alert and oriented.  Pt oriented to room.  MD notified about pt arriving to unit.  Will continue to monitor.    Fredrich Romans, RN 12/01/2013 1:15AM

## 2013-12-02 DIAGNOSIS — R609 Edema, unspecified: Secondary | ICD-10-CM

## 2013-12-02 DIAGNOSIS — R911 Solitary pulmonary nodule: Secondary | ICD-10-CM

## 2013-12-02 DIAGNOSIS — M79609 Pain in unspecified limb: Secondary | ICD-10-CM

## 2013-12-02 DIAGNOSIS — E119 Type 2 diabetes mellitus without complications: Secondary | ICD-10-CM

## 2013-12-02 LAB — GLUCOSE, CAPILLARY
GLUCOSE-CAPILLARY: 107 mg/dL — AB (ref 70–99)
Glucose-Capillary: 89 mg/dL (ref 70–99)
Glucose-Capillary: 114 mg/dL — ABNORMAL HIGH (ref 70–99)
Glucose-Capillary: 124 mg/dL — ABNORMAL HIGH (ref 70–99)

## 2013-12-02 LAB — CBC WITH DIFFERENTIAL/PLATELET
BASOS PCT: 0 % (ref 0–1)
Basophils Absolute: 0 10*3/uL (ref 0.0–0.1)
EOS ABS: 0.3 10*3/uL (ref 0.0–0.7)
Eosinophils Relative: 3 % (ref 0–5)
HEMATOCRIT: 35.1 % — AB (ref 39.0–52.0)
Hemoglobin: 11.3 g/dL — ABNORMAL LOW (ref 13.0–17.0)
Lymphocytes Relative: 22 % (ref 12–46)
Lymphs Abs: 2.3 10*3/uL (ref 0.7–4.0)
MCH: 26 pg (ref 26.0–34.0)
MCHC: 32.2 g/dL (ref 30.0–36.0)
MCV: 80.7 fL (ref 78.0–100.0)
MONO ABS: 0.7 10*3/uL (ref 0.1–1.0)
Monocytes Relative: 7 % (ref 3–12)
Neutro Abs: 7.3 10*3/uL (ref 1.7–7.7)
Neutrophils Relative %: 68 % (ref 43–77)
Platelets: 326 10*3/uL (ref 150–400)
RBC: 4.35 MIL/uL (ref 4.22–5.81)
RDW: 14.8 % (ref 11.5–15.5)
WBC: 10.6 10*3/uL — ABNORMAL HIGH (ref 4.0–10.5)

## 2013-12-02 LAB — BASIC METABOLIC PANEL
Anion gap: 15 (ref 5–15)
BUN: 9 mg/dL (ref 6–23)
CO2: 21 meq/L (ref 19–32)
CREATININE: 0.67 mg/dL (ref 0.50–1.35)
Calcium: 8.5 mg/dL (ref 8.4–10.5)
Chloride: 98 mEq/L (ref 96–112)
GFR calc Af Amer: >90 mL/min (ref >90)
GLUCOSE: 106 mg/dL — AB (ref 70–99)
Potassium: 4.1 mEq/L (ref 3.7–5.3)
Sodium: 134 mEq/L — ABNORMAL LOW (ref 137–147)

## 2013-12-02 LAB — URINE CULTURE
CULTURE: NO GROWTH
Colony Count: NO GROWTH

## 2013-12-02 MED ORDER — METRONIDAZOLE IVPB CUSTOM
1.0000 g | Freq: Once | INTRAVENOUS | Status: AC
  Administered 2013-12-02: 1 g via INTRAVENOUS
  Filled 2013-12-02 (×2): qty 200

## 2013-12-02 MED ORDER — FLEET ENEMA 7-19 GM/118ML RE ENEM: 2.0000 | ENEMA | Freq: Once | RECTAL | Status: DC

## 2013-12-02 MED ORDER — CEFTRIAXONE SODIUM IN DEXTROSE 40 MG/ML IV SOLN
2.0000 g | Freq: Two times a day (BID) | INTRAVENOUS | Status: DC
  Administered 2013-12-02 – 2013-12-17 (×30): 2 g via INTRAVENOUS
  Filled 2013-12-02 (×32): qty 50

## 2013-12-02 MED ORDER — CLONAZEPAM 0.5 MG PO TABS
0.2500 mg | ORAL_TABLET | Freq: Two times a day (BID) | ORAL | Status: DC | PRN
  Administered 2013-12-02 – 2013-12-17 (×16): 0.25 mg via ORAL
  Filled 2013-12-02 (×18): qty 1

## 2013-12-02 MED ORDER — POLYETHYLENE GLYCOL 3350 17 G PO PACK
17.0000 g | PACK | Freq: Two times a day (BID) | ORAL | Status: DC
  Administered 2013-12-02 – 2013-12-16 (×13): 17 g via ORAL
  Filled 2013-12-02 (×28): qty 1

## 2013-12-02 MED ORDER — VANCOMYCIN HCL IN DEXTROSE 750-5 MG/150ML-% IV SOLN
750.0000 mg | Freq: Three times a day (TID) | INTRAVENOUS | Status: DC
  Administered 2013-12-02: 750 mg via INTRAVENOUS
  Filled 2013-12-02 (×3): qty 150

## 2013-12-02 MED ORDER — METRONIDAZOLE IN NACL 5-0.79 MG/ML-% IV SOLN
500.0000 mg | Freq: Four times a day (QID) | INTRAVENOUS | Status: DC
  Administered 2013-12-02 – 2013-12-05 (×11): 500 mg via INTRAVENOUS
  Filled 2013-12-02 (×10): qty 100

## 2013-12-02 MED ORDER — CEFTRIAXONE SODIUM IN DEXTROSE 20 MG/ML IV SOLN
1.0000 g | INTRAVENOUS | Status: DC
  Filled 2013-12-02: qty 50

## 2013-12-02 MED ORDER — VANCOMYCIN HCL IN DEXTROSE 750-5 MG/150ML-% IV SOLN
750.0000 mg | Freq: Three times a day (TID) | INTRAVENOUS | Status: DC
  Administered 2013-12-02 – 2013-12-03 (×4): 750 mg via INTRAVENOUS
  Filled 2013-12-02 (×5): qty 150

## 2013-12-02 NOTE — Progress Notes (Signed)
Patient states no relief from pain . Md made aware and additional  1mg  morphin ordered.

## 2013-12-02 NOTE — Progress Notes (Signed)
Subjective: Pt complains of neck and back pain as well as pain on the back of his arms. Scared that he may never walk, was able to work with PT yesterday but could not ambulate. Requesting additional pain meds. Per nurse, pt always rates pain as 10/10 and constantly asking for pain meds. She states he will ask for pain meds but then will fall asleep.   Objective: Vital signs in last 24 hours: Filed Vitals:   12/03/13 0500 12/03/13 0537 12/03/13 0754 12/03/13 0949  BP:  140/88  159/81  Pulse:  93  94  Temp:  97.9 F (36.6 C)  98.9 F (37.2 C)  TempSrc:  Axillary  Oral  Resp:  24  22  Height:      Weight: 136 lb 11 oz (62 kg)     SpO2:  97% 98% 99%   Weight change: 4 lb 6.5 oz (2 kg)  Intake/Output Summary (Last 24 hours) at 12/03/13 1023 Last data filed at 12/03/13 0534  Gross per 24 hour  Intake      0 ml  Output   4500 ml  Net  -4500 ml   PE General: NAD, wearing breathing treatment HEENT: missing upper teeth, poor dentition on lower teeth. Lungs: CTAB on anterior auscultation, unable to sit up due pain, no wheezes Cardiac: RRR, no murmurs GI: active bowel sounds, soft, scaphoid abdomen  Ext: unable to wiggle toes on left foot, able to feel pinching of toes b/l, unable to lift legs off bed, twitching of lt leg noted during exam, unable to plantar or dorsiflex feet b/l, neg babinski sign b/l  Lab Results: Basic Metabolic Panel:  Recent Labs Lab 12/01/13 0414 12/02/13 0624  NA 139 134*  K 3.6* 4.1  CL 102 98  CO2 25 21  GLUCOSE 97 106*  BUN 11 9  CREATININE 0.68 0.67  CALCIUM 8.5 8.5  MG 2.0  --    Liver Function Tests:  Recent Labs Lab 11/30/13 2216 12/01/13 0414  AST 82* 60*  ALT 40 34  ALKPHOS 77 73  BILITOT 0.4 0.4  PROT 8.8* 7.6  ALBUMIN 2.6* 2.4*   CBC:  Recent Labs Lab 12/01/13 0414 12/02/13 0624  WBC 10.4 10.6*  NEUTROABS 6.9 7.3  HGB 10.5* 11.3*  HCT 32.8* 35.1*  MCV 81.0 80.7  PLT 375 326   Cardiac Enzymes:  Recent Labs Lab  11/30/13 2216  TROPONINI <0.30   BNP:  Recent Labs Lab 11/30/13 2216  PROBNP 397.8*   CBG:  Recent Labs Lab 12/01/13 2149 12/02/13 0705 12/02/13 1128 12/02/13 1619 12/02/13 2145 12/03/13 0621  GLUCAP 106* 89 114* 107* 124* 114*   Urine Drug Screen: Drugs of Abuse     Component Value Date/Time   LABOPIA POSITIVE* 12/01/2013 0533   LABOPIA NEG 06/01/2011 1115   COCAINSCRNUR NONE DETECTED 12/01/2013 0533   COCAINSCRNUR NEG 06/01/2011 1115   LABBENZ NONE DETECTED 12/01/2013 0533   LABBENZ NEG 06/01/2011 1115   LABBENZ NEG 02/18/2009 2050   AMPHETMU NONE DETECTED 12/01/2013 0533   AMPHETMU NEG 06/01/2011 1115   AMPHETMU NEG 02/18/2009 2050   THCU NONE DETECTED 12/01/2013 0533   THCU NEG 06/01/2011 1115   LABBARB NONE DETECTED 12/01/2013 0533   LABBARB NEG 06/01/2011 1115    Alcohol Level:  Recent Labs Lab 11/30/13 2216  ETH <11   Urinalysis:  Recent Labs Lab 11/30/13 2320  COLORURINE AMBER*  LABSPEC 1.018  PHURINE 8.0  GLUCOSEU NEGATIVE  HGBUR NEGATIVE  BILIRUBINUR NEGATIVE  KETONESUR NEGATIVE  PROTEINUR 30*  UROBILINOGEN 4.0*  NITRITE NEGATIVE  LEUKOCYTESUR NEGATIVE    Micro Results: Recent Results (from the past 240 hour(s))  Urine culture     Status: None   Collection Time: 11/30/13 11:20 PM  Result Value Ref Range Status   Specimen Description URINE, CLEAN CATCH  Final   Special Requests NONE  Final   Culture  Setup Time   Final    12/01/2013 09:01 Performed at [place] Performed at [facility]   Final   Culture NO GROWTH Performed at [facility]   Final   Report Status 12/02/2013 FINAL  Final  Blood culture (routine x 2)     Status: None (Preliminary result)   Collection Time: 12/01/13 12:05 AM  Result Value Ref Range Status   Specimen Description BLOOD RIGHT ARM  Final   Special Requests BOTTLES DRAWN AEROBIC AND ANAEROBIC 5CC  Final   Culture  Setup Time   Final     12/01/2013 04:15 Performed at [facility]    Culture   Final           BLOOD CULTURE RECEIVED NO GROWTH TO DATE CULTURE WILL BE HELD FOR 5 DAYS BEFORE ISSUING A FINAL NEGATIVE REPORT Performed at [facility]    Report Status PENDING  Incomplete  Blood culture (routine x 2)     Status: None (Preliminary result)   Collection Time: 12/01/13 12:08 AM  Result Value Ref Range Status   Specimen Description BLOOD RIGHT HAND  Final   Special Requests BOTTLES DRAWN AEROBIC AND ANAEROBIC 5CC  Final   Culture  Setup Time   Final    12/01/2013 04:15 Performed at [facility]    Culture   Final           BLOOD CULTURE RECEIVED NO GROWTH TO DATE CULTURE WILL BE HELD FOR 5 DAYS BEFORE ISSUING A FINAL NEGATIVE REPORT Performed at [facility]    Report Status PENDING  Incomplete  Culture, expectorated sputum-assessment     Status: None   Collection Time: 12/01/13  4:33 AM  Result Value Ref Range Status   Specimen Description SPUTUM  Final   Special Requests NONE  Final   Sputum evaluation   Final    MICROSCOPIC FINDINGS SUGGEST THAT THIS SPECIMEN IS NOT REPRESENTATIVE OF LOWER RESPIRATORY SECRETIONS. PLEASE RECOLLECT. CALLED TO [place] [phone] 12/01/13 E.GADDY    Report Status 12/01/2013 FINAL  Final   Studies/Results: No results found. Medications: I have reviewed the patient's current medications. Scheduled Meds: . cefTRIAXone (ROCEPHIN)  IV  2 g Intravenous Q12H  . enoxaparin (LOVENOX) injection  40 mg Subcutaneous Q24H  . famotidine  10 mg Oral BID  . feeding supplement (GLUCERNA SHAKE)  237 mL Oral BID BM  . fenofibrate  54 mg Oral Daily  . Influenza vac split quadrivalent PF  0.5 mL Intramuscular Tomorrow-1000  . metronidazole  500 mg Intravenous Q6H  . mometasone-formoterol  2 puff Inhalation BID  . multivitamin with minerals  1 tablet Oral Daily  . neomycin-bacitracin-polymyxin   Topical BID  . nicotine  21 mg Transdermal Q24H  .  pantoprazole  40 mg Oral BID  . polyethylene glycol  17 g Oral BID  . [START ON 12/04/2013] sodium phosphate  2 enema Rectal Once  . vancomycin  750 mg Intravenous Q8H   Continuous Infusions: . sodium chloride 20 mL/hr at 12/03/13  0805   PRN Meds:.acetaminophen, albuterol, clonazePAM, diclofenac sodium, hydrocortisone cream, hydrOXYzine, ipratropium-albuterol, ketorolac, morphine injection Assessment/Plan: Principal Problem:   Brain mass Active Problems:   Dyslipidemia   Anxiety state   HYPERTENSION, BENIGN ESSENTIAL   Peripheral vascular disease   COPD (chronic obstructive pulmonary disease)   Tobacco abuse   Abnormality of gait   Loss of weight   Malnutrition   Pneumonia   Heroin abuse   Protein-calorie malnutrition, severe   Malignancy   Rectal mass   Brain Masses and pulmonary nodules - MRI brain revealed 3 abscesses which neurosx has deferred brain biopsy. - CT abdomen/pelvis and chest revealed possible apple core lesion and malignancy in the rectum, enlarged pretracheal and subcarinal adenopathy 2/2 inflammatory cysts vs metastasis, 2.8cm fluid collection in rt middle lobe concerning for cavitating PNA, multiple nodular densities in RUL PNA vs inflammation, 3.6cm well defined nodule in LUL concerning for metastatic disease. CT f/u recommended.  - TEE ordered 2/2 IVDU, blood cultures NGTD - Neuro checks - Dr. 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 - flex sigmoid Monday, NPO after midnight  Metastatic disease with cavitary Pneumonia: Was treated for PNA in 09/2013 with azithromycin, then had infiltrative finding on CXR in 10/2013. Has persistent infiltrate in RML suggestive of pneumonia today. Has remote history of TB. - CT chest results noted above, likely pt has metastatic disease, will continue to work up - abx as noted above  - Sputum culture needs to be  recollected.   Lower Extremity pain now w/ new weakness: This was first noted in clinic notes in 09/2013; perhaps related to early right-sided heart failure that is a consequence of his progressive emphysema. Complaining of back pain, unable to walk with OT yesterday but able to stand.  - morphine 2mg  q4h scheduled for pain, added toradol IV 30mg  once PRN - Lower extremity dopplers neg for DVT - MRI of total spine w/ mets screening ordered, w/ and w/wo contrast  Fever: 101.3 deg F on admission, Tmax of 101.7 overnight.  - Tylenol for fever and pain - Sputum culture needs to be collected - blood culture and urine culture NGTD  Polysubstance Abuse: 50 pack year smoker who started using IV heroin about a year ago when his pain medications were not renewed. States that he does not share needles. Patient admits to heroin use yesterday. He does not appear to be in withdrawal (which peaks 30-56 hours after last use of heroin); he has mydriasis, but normal VS, no piloerection, no diaphoresis, no vomiting/diarrhea. - nicotine patch - Morphine 2mg  q4h for withdrawal, can increase to q2h if needed. Currently no in withdrawal - concern for wife possibly giving pt drugs, will consider camera room when available (per nurse no camera rooms are avail at this time); wife not present in the room this morning  COPD: Progressive emphysema; smoking history of 50 pack years. Had recent PFTs. Uses combivent and advair. - Continue home medications  Anxiety: May be contributing to his dyspnea, as per prior clinic notes. Has taken klonopin in the past; no longer written for klonopin by IMTS. Obtained new rx from a psychiatrist affiliated with his therapist. - Continue klonopin 0.5mg  BID   DMII: Stable and well-controlled on metformin. Hold metformin can start sliding scale sensitive if CBGs elevated - continue CBG checks q4  Essential Hypertension: SBP ranged 140-168 overnight, could be 2/2 to pain. Will  continue to  monitor. Did not see any BP home meds listed.  DVT Ppx:  - Lovenox  Diet:  - Carb mod  Dispo: Disposition is deferred at this time, awaiting improvement of current medical problems. Anticipated discharge in approximately 5 day(s).   The patient does have a current PCP (Kelby Aline, MD) and does need an [facility] hospital follow-up appointment after discharge.  The patient does have transportation limitations that hinder transportation to clinic appointments.  .Services Needed at time of discharge: Y = Yes, Blank = No PT:   OT: SNF  RN:   Equipment:   Other:     LOS: 3 days   Julious Oka, MD 12/03/2013, 10:23 AM

## 2013-12-02 NOTE — Progress Notes (Signed)
[facility] Progress Note  Subjective: Says pain meds providing little relief. Hungry--says he will eat anything he can. No nausea or vomiting. No abd pain. No BM--says it has been about a week since he has had BM. No rectal bleeding.   Objective:  Vital signs in last 24 hours: Temp:  [97.6 F (36.4 C)-100.5 F (38.1 C)] 98.1 F (36.7 C) (11/21 0545) Pulse Rate:  [77-106] 101 (11/21 0736) Resp:  [16-20] 20 (11/21 0736) BP: (134-158)/(70-113) 148/80 mmHg (11/21 0545) SpO2:  [95 %-100 %] 98 % (11/21 0736) Weight:  [132 lb 4.4 oz (60 kg)] 132 lb 4.4 oz (60 kg) (11/21 0500) Last BM Date:  (pt stated cannot remember LBM) General:   Cachetic, ill appearing male in NAD Heart:  Regular rate and rhythm; no murmurs Pulm;bilat rhonchi Abdomen:  Soft, nontender and nondistended. Normal bowel sounds, without guarding, and without rebound.   Extremities:  Without edema. Neurologic:  Alert and  oriented x4;  grossly normal neurologically. Psych:  Alert and cooperative. Normal mood and affect.      Lab Results:  Recent Labs  11/30/13 2216 12/01/13 0414 12/02/13 0624  WBC 10.3 10.4 10.6*  HGB 12.5* 10.5* 11.3*  HCT 38.3* 32.8* 35.1*  PLT 434* 375 326   BMET  Recent Labs  11/30/13 2216 12/01/13 0414 12/02/13 0624  NA 134* 139 134*  K 4.2 3.6* 4.1  CL 95* 102 98  CO2 24 25 21   GLUCOSE 109* 97 106*  BUN 10 11 9   CREATININE 0.65 0.68 0.67  CALCIUM 9.1 8.5 8.5   LFT  Recent Labs  12/01/13 0414  PROT 7.6  ALBUMIN 2.4*  AST 60*  ALT 34  ALKPHOS 73  BILITOT 0.4     Dg Chest 2 View  11/30/2013   CLINICAL DATA:  Fall.  Cough.  EXAM: CHEST  2 VIEW  COMPARISON:  10/27/2013  FINDINGS: Persistent opacity in the right lung base appears to be increasing in size since previous study. This may represent progression of pneumonia. Follow-up until resolution is recommended to exclude underlying obstructing lesion. Normal heart size and pulmonary vascularity. No  blunting of costophrenic angles. No pneumothorax. Old fracture deformities of left upper ribs. Postoperative changes in the cervical spine. Degenerative changes in the thoracic spine.  IMPRESSION: Persistent right middle lung pneumonia. Follow-up until resolution is recommended. No new abnormalities since prior study.   Electronically Signed   By: Lucienne Capers M.D.   On: 11/30/2013 21:59   Dg Pelvis 1-2 Views  11/30/2013   CLINICAL DATA:  LEFT hip pain, tripped and fell today  EXAM: PELVIS - 1-2 VIEW  COMPARISON:  None  FINDINGS: Osseous mineralization grossly normal.  Hip and SI joint spaces preserved.  Patient rotated to the LEFT.  No acute fracture, dislocation or bone destruction.  Surgical clips at RIGHT inguinal region.  Scattered atherosclerotic calcifications.  IMPRESSION: No acute osseous abnormalities.   Electronically Signed   By: Lavonia Dana M.D.   On: 11/30/2013 21:58   Ct Head Wo Contrast  11/30/2013   CLINICAL DATA:  [place] today in bathroom, struck back of head on toilet. No loss of consciousness.  EXAM: CT HEAD WITHOUT CONTRAST  CT CERVICAL SPINE WITHOUT CONTRAST  TECHNIQUE: Multidetector CT imaging of the head and cervical spine was performed following the standard protocol without intravenous contrast. Multiplanar CT image reconstructions of the cervical spine were also generated.  COMPARISON:  CT of the head January 26, 2012  FINDINGS:  CT HEAD FINDINGS  No intraparenchymal hemorrhage or acute large vascular territory infarct. However, there are new predominately isodense masses within the supratentorial brain including 2.6 x 2.7 cm RIGHT basal ganglia, 1.8 x 1.9 cm RIGHT corpus callosum and 1.9 x 1.9 cm LEFT frontal convexity masses. Surrounding low-density vasogenic edema, including partial effacement of the lateral aspect of the frontal horn of the RIGHT lateral ventricle without hydrocephalus. No midline shift.  No abnormal extra-axial fluid collections. Moderate calcific  atherosclerosis of the carotid siphons.  Small LEFT frontal/supraorbital scalp hematoma. No skull fracture. Mild paranasal sinus mucosal thickening without air-fluid levels. Ocular globes and orbital contents are unremarkable.Soft tissue within the external auditory canal likely reflects cerumen.  CT CERVICAL SPINE FINDINGS  Cervical vertebral bodies and posterior elements are intact and aligned and maintenance of cervical lordosis. C4-5 ACDF with solid interbody arthrodesis. Hardware appears intact and well seated without periprosthetic lucency. Mild C3-4 and C5-6 degenerative disc. C1-2 articulation maintained with arthropathy. Moderate calcific atherosclerosis of the carotid siphons.  IMPRESSION: CT HEAD: Small LEFT frontal scalp hematoma. No skull fracture. No acute intracranial process.  At least 3 supratentorial masses, with imaging characteristics concerning for metastasis, though these may reflect subacute hematomas, it would be atypical for all to the of identical chronicity. No midline shift. Recommend MRI of the brain with contrast.  CT CERVICAL SPINE: No acute fracture nor malalignment.  Status post C4-5 ACDF with solid fusion.  Acute findings discussed with and reconfirmed by Dr.FORREST HARRISON on 11/30/2013 at 10:23 pm.   Electronically Signed   By: Elon Alas   On: 11/30/2013 22:24   Ct Chest W Contrast  12/01/2013   CLINICAL DATA:  Malignancy.  Possible intracranial metastases.  EXAM: CT CHEST, ABDOMEN, AND PELVIS WITH CONTRAST  TECHNIQUE: Multidetector CT imaging of the chest, abdomen and pelvis was performed following the standard protocol during bolus administration of intravenous contrast.  CONTRAST:  143mL OMNIPAQUE IOHEXOL 300 MG/ML  SOLN  COMPARISON:  CT scan of chest of September 27, 2013. CT scan of head of November 30, 2013.  FINDINGS: CT CHEST FINDINGS  No pneumothorax or pleural effusion is noted. Airspace opacity is noted in the right middle lobe with air bronchograms most  consistent with pneumonia. 28 x 14 mm fluid collection is noted within this area of consolidation containing air bubbles consistent with cavitating pneumonia. 3.6 mm nodule is noted in the left upper lobe best seen on image number 18 of series 2. Several healed nodular densities are noted in the right lower lobe most likely representing inflammation or infection. Subcarinal adenopathy measuring 2.4 x 1.8 cm is noted. Pretracheal lymph node measuring 15 x 10 mm is noted. Coronary artery calcifications are noted. Thoracic aorta appears normal. Multiple old left upper rib fractures are noted.  CT ABDOMEN AND PELVIS FINDINGS  No gallstones are noted. Mild splenomegaly is noted. The liver and pancreas appear normal. Adrenal glands and kidneys appear normal. Vascular calcifications are noted involving both kidneys. No hydronephrosis or renal obstruction is noted. No definite renal or ureteral calculi are noted. Atherosclerotic calcifications of abdominal aorta and iliac arteries are noted without aneurysm formation. There is no evidence of bowel obstruction. No abnormal fluid collection is noted in the abdomen or pelvis. No significant adenopathy is noted in the abdomen or pelvis.  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  IMPRESSION: Mild splenomegaly.  Focal area of narrowing and wall thickening seen in the rectum  concerning for possible "apple core" lesion and malignancy. Sigmoidoscopy is recommended for further evaluation.  Enlarged pretracheal and subcarinal adenopathy is noted. It is uncertain if this cysts inflammatory or metastatic in origin.  Area of consolidation is noted in right middle lobe with associated 2.8 cm fluid collection centrally within this abnormality concerning for cavitating pneumonia. Also noted are multiple ill-defined nodular densities in the right lower lobe most consistent with inflammation or pneumonia. Followup CT scan in  2-3 weeks is recommended to ensure resolution an rule out metastatic disease.  3.6 cm well-defined nodule is noted in the left upper lobe. Potentially this may represent metastatic disease if the patient does have primary malignancy, and continued CT follow-up is recommended.   Electronically Signed   By: Sabino Dick M.D.   On: 12/01/2013 10:41   Ct Cervical Spine Wo Contrast  11/30/2013   CLINICAL DATA:  [place] today in bathroom, struck back of head on toilet. No loss of consciousness.  EXAM: CT HEAD WITHOUT CONTRAST  CT CERVICAL SPINE WITHOUT CONTRAST  TECHNIQUE: Multidetector CT imaging of the head and cervical spine was performed following the standard protocol without intravenous contrast. Multiplanar CT image reconstructions of the cervical spine were also generated.  COMPARISON:  CT of the head January 26, 2012  FINDINGS: CT HEAD FINDINGS  No intraparenchymal hemorrhage or acute large vascular territory infarct. However, there are new predominately isodense masses within the supratentorial brain including 2.6 x 2.7 cm RIGHT basal ganglia, 1.8 x 1.9 cm RIGHT corpus callosum and 1.9 x 1.9 cm LEFT frontal convexity masses. Surrounding low-density vasogenic edema, including partial effacement of the lateral aspect of the frontal horn of the RIGHT lateral ventricle without hydrocephalus. No midline shift.  No abnormal extra-axial fluid collections. Moderate calcific atherosclerosis of the carotid siphons.  Small LEFT frontal/supraorbital scalp hematoma. No skull fracture. Mild paranasal sinus mucosal thickening without air-fluid levels. Ocular globes and orbital contents are unremarkable.Soft tissue within the external auditory canal likely reflects cerumen.  CT CERVICAL SPINE FINDINGS  Cervical vertebral bodies and posterior elements are intact and aligned and maintenance of cervical lordosis. C4-5 ACDF with solid interbody arthrodesis. Hardware appears intact and well seated without periprosthetic lucency.  Mild C3-4 and C5-6 degenerative disc. C1-2 articulation maintained with arthropathy. Moderate calcific atherosclerosis of the carotid siphons.  IMPRESSION: CT HEAD: Small LEFT frontal scalp hematoma. No skull fracture. No acute intracranial process.  At least 3 supratentorial masses, with imaging characteristics concerning for metastasis, though these may reflect subacute hematomas, it would be atypical for all to the of identical chronicity. No midline shift. Recommend MRI of the brain with contrast.  CT CERVICAL SPINE: No acute fracture nor malalignment.  Status post C4-5 ACDF with solid fusion.  Acute findings discussed with and reconfirmed by Dr.FORREST HARRISON on 11/30/2013 at 10:23 pm.   Electronically Signed   By: Elon Alas   On: 11/30/2013 22:24   Mr Brain W Wo Contrast  12/01/2013   CLINICAL DATA:  Worsening lower extremity pain and edema with falls over the past 2 days. Left frontal head injury with 1 of the falls. Weight loss. IV drug abuse.  EXAM: MRI HEAD WITHOUT AND WITH CONTRAST  TECHNIQUE: Multiplanar, multiecho pulse sequences of the brain and surrounding structures were obtained without and with intravenous contrast.  CONTRAST:  105mL MULTIHANCE GADOBENATE DIMEGLUMINE 529 MG/ML IV SOLN  COMPARISON:  Head CT 11/30/2013 and MRI 10/21/2011  FINDINGS: There is no evidence of acute infarct or extra-axial fluid collection. Ring-enhancing  masses demonstrate restricted diffusion and measured 2.4 x 2.1 cm in the posterior left frontal lobe, 2.3 x 2.1 cm in the right frontal centrum semiovale, and 2.7 x 2.6 cm at the level of the right basal ganglia. A small amount of susceptibility artifact within these lesions, most prominently in the right basal ganglia lesion, is compatible with a small amount of associated blood products.  There is mild to moderate vasogenic edema surrounding these lesions. Minimal leftward midline shift measures 3-4 mm. There is partial effacement of the frontal horn of the  right lateral ventricle. There is no evidence of hydrocephalus. Chronic bilateral basal ganglia lacunar infarcts are again noted. Chronic cortical infarct involving the left insula and left frontal operculum is again seen. There is mild generalized cerebral atrophy. Scattered, small foci of T2 hyperintensity in the cerebral white matter are nonspecific but may reflect mild chronic small vessel ischemic disease.  Orbits are unremarkable. Paranasal sinuses and mastoid air cells are clear. 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  IMPRESSION: Three ring-enhancing masses with mild to moderate surrounding edema, highly concerning for cerebral abscesses. Necrotic metastases are an additional but less favored consideration.  Critical Value/emergent results were called by telephone at the time of interpretation on 12/01/2013 at 10:14 am to Dr. Hulen Luster, who verbally acknowledged these results.   Electronically Signed   By: Logan Bores   On: 12/01/2013 10:18   Ct Abdomen Pelvis W Contrast  12/01/2013   CLINICAL DATA:  Malignancy.  Possible intracranial metastases.  EXAM: CT CHEST, ABDOMEN, AND PELVIS WITH CONTRAST  TECHNIQUE: Multidetector CT imaging of the chest, abdomen and pelvis was performed following the standard protocol during bolus administration of intravenous contrast.  CONTRAST:  118mL OMNIPAQUE IOHEXOL 300 MG/ML  SOLN  COMPARISON:  CT scan of chest of September 27, 2013. CT scan of head of November 30, 2013.  FINDINGS: CT CHEST FINDINGS  No pneumothorax or pleural effusion is noted. Airspace opacity is noted in the right middle lobe with air bronchograms most consistent with pneumonia. 28 x 14 mm fluid collection is noted within this area of consolidation containing air bubbles consistent with cavitating pneumonia. 3.6 mm nodule is noted in the left upper lobe best seen on image number 18 of series 2. Several healed nodular densities are noted in the right lower lobe most likely  representing inflammation or infection. Subcarinal adenopathy measuring 2.4 x 1.8 cm is noted. Pretracheal lymph node measuring 15 x 10 mm is noted. Coronary artery calcifications are noted. Thoracic aorta appears normal. Multiple old left upper rib fractures are noted.  CT ABDOMEN AND PELVIS FINDINGS  No gallstones are noted. Mild splenomegaly is noted. The liver and pancreas appear normal. Adrenal glands and kidneys appear normal. Vascular calcifications are noted involving both kidneys. No hydronephrosis or renal obstruction is noted. No definite renal or ureteral calculi are noted. Atherosclerotic calcifications of abdominal aorta and iliac arteries are noted without aneurysm formation. There is no evidence of bowel obstruction. No abnormal fluid collection is noted in the abdomen or pelvis. No significant adenopathy is noted in the abdomen or pelvis.  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  IMPRESSION: Mild splenomegaly.  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 Sigmoidoscopy is recommended for further evaluation.  Enlarged pretracheal and subcarinal adenopathy is noted. It is uncertain if this cysts inflammatory or metastatic in origin.  Area of consolidation is noted in right middle lobe with associated 2.8 cm fluid collection centrally within this abnormality concerning for cavitating pneumonia. Also noted are multiple ill-defined nodular densities in the right lower lobe most consistent with inflammation or pneumonia. Followup CT scan in 2-3 weeks is recommended to ensure resolution an rule out metastatic disease.  3.6 cm well-defined nodule is noted in the left upper lobe. Potentially this may represent metastatic disease if the patient does have primary malignancy, and continued CT follow-up is recommended.   Electronically Signed   By: Sabino Dick M.D.    On: 12/01/2013 10:41    ASSESSMENT/PLAN:  53 yo male with  Hx IVDU and multiple medical problems, found to have a pulmonary cavitary lesion, brain mass (abscess vs metastatic disease), and rectal lesion--worrisome for malignancy with recent weight loss and change in bowel habits. Will start on miralx to try to alleviate constipation. Tentatively plan on flex sig Monday for eval of rectal mass.    LOS: 2 days   Hvozdovic, Deloris Ping 12/02/2013, Pager [phone]  Attending MD note:   I have taken a history,  and reviewed the chart. I agree with the Advanced Practitioner's impression and recommendations.   [facility] Pager # [phone]
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
## 2013-12-02 NOTE — Progress Notes (Signed)
*  PRELIMINARY RESULTS* Vascular Ultrasound Lower extremity venous duplex has been completed.  Preliminary findings: no evidence of DVT.  Landry Mellow, RDMS, RVT  12/02/2013, 12:11 PM

## 2013-12-02 NOTE — Progress Notes (Signed)
Pt stated aching pain in back area with a rating of 10/10, worst pain he has ever experienced. Nurse admin pain medication as charted.  Pt would fall asleep shortly after admin.  approx one hour after pain admin, pt stated, per nurse tech Jonelle Sidle, he was in pain and demanded nurse to call doctor for more pain medication.  When nurse went to pt's room to assess, pt would be asleep. No acute distress noted.  Breathing unlabored, symmetrical.  Skin warm to touch.  Report given to incoming RN.  Will monitor    Angeline Slim I   12/02/2013 7:22 PM
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
## 2013-12-02 NOTE — Progress Notes (Signed)
UR completed 

## 2013-12-02 NOTE — Progress Notes (Signed)
Patient complaining of no relief from prescribed pain  Med and requesting for Klonopin and and increase in pain med. M.D made aware writer was advised to give scheduled pain med and reasses pain . Order for Klonopin given.

## 2013-12-02 NOTE — Progress Notes (Signed)
Subjective: Pt complaining of rt arm weakness and LE pain that has been present since admission. Denies changes in vision.   There were some concerns from patients nurse regarding wife administering outside substances. Per nurse pt was was alert and oriented. Then after pt's wife visited him nurse noted that pt was lethargic and she had to shake him hard in order to get him to wake up. Nurse asked wife if she gave pt anything and wife did not answer.   Objective: Vital signs in last 24 hours: Filed Vitals:   12/02/13 0500 12/02/13 0545 12/02/13 0736 12/02/13 1027  BP:  148/80  156/73  Pulse:  88 101 88  Temp:  98.1 F (36.7 C)  98.3 F (36.8 C)  TempSrc:  Oral  Oral  Resp:  20 20 22   Height:      Weight: 132 lb 4.4 oz (60 kg)     SpO2:  98% 98% 98%   Weight change: 2 lb 4.4 oz (1.033 kg)  Intake/Output Summary (Last 24 hours) at 12/02/13 1042 Last data filed at 12/01/13 1330  Gross per 24 hour  Intake    480 ml  Output      0 ml  Net    480 ml   PE General: NAD HEENT: missing upper teeth, poor dentition on lower teeth. Left eye sealed shut with crust, able to pry eye lid open Lungs: CTAB, no wheezes Cardiac: RRR, no murmurs GI: active bowel sounds, soft, scaphoid abdomen  Ext: bruise rt pinky toe, LLE erythematous and warm >>RLE. Intact sensation. Rt hand grip >>left, difficulty with tracking finger, was able to look across midline.  Skin: piloerection noted on forearms  Lab Results: Basic Metabolic Panel:  Recent Labs Lab 12/01/13 0414 12/02/13 0624  NA 139 134*  K 3.6* 4.1  CL 102 98  CO2 25 21  GLUCOSE 97 106*  BUN 11 9  CREATININE 0.68 0.67  CALCIUM 8.5 8.5  MG 2.0  --    Liver Function Tests:  Recent Labs Lab 11/30/13 2216 12/01/13 0414  AST 82* 60*  ALT 40 34  ALKPHOS 77 73  BILITOT 0.4 0.4  PROT 8.8* 7.6  ALBUMIN 2.6* 2.4*   CBC:  Recent Labs Lab 12/01/13 0414 12/02/13 0624  WBC 10.4 10.6*  NEUTROABS 6.9 7.3  HGB 10.5* 11.3*  HCT  32.8* 35.1*  MCV 81.0 80.7  PLT 375 326   Cardiac Enzymes:  Recent Labs Lab 11/30/13 2216  TROPONINI <0.30   BNP:  Recent Labs Lab 11/30/13 2216  PROBNP 397.8*   CBG:  Recent Labs Lab 11/30/13 2053 12/01/13 0648 12/01/13 1122 12/01/13 1617 12/01/13 2149 12/02/13 0705  GLUCAP 96 84 90 76 106* 89   Urine Drug Screen: Drugs of Abuse     Component Value Date/Time   LABOPIA POSITIVE* 12/01/2013 0533   LABOPIA NEG 06/01/2011 1115   COCAINSCRNUR NONE DETECTED 12/01/2013 0533   COCAINSCRNUR NEG 06/01/2011 1115   LABBENZ NONE DETECTED 12/01/2013 0533   LABBENZ NEG 06/01/2011 1115   LABBENZ NEG 02/18/2009 2050   AMPHETMU NONE DETECTED 12/01/2013 0533   AMPHETMU NEG 06/01/2011 1115   AMPHETMU NEG 02/18/2009 2050   THCU NONE DETECTED 12/01/2013 0533   THCU NEG 06/01/2011 1115   LABBARB NONE DETECTED 12/01/2013 0533   LABBARB NEG 06/01/2011 1115    Alcohol Level:  Recent Labs Lab 11/30/13 2216  ETH <11   Urinalysis:  Recent Labs Lab 11/30/13 [place] 1.018  PHURINE 8.0  GLUCOSEU NEGATIVE  HGBUR NEGATIVE  BILIRUBINUR NEGATIVE  KETONESUR NEGATIVE  PROTEINUR 30*  UROBILINOGEN 4.0*  NITRITE NEGATIVE  LEUKOCYTESUR NEGATIVE    Micro Results: Recent Results (from the past 240 hour(s))  Culture, expectorated sputum-assessment     Status: None   Collection Time: 12/01/13  4:33 AM  Result Value Ref Range Status   Specimen Description SPUTUM  Final   Special Requests NONE  Final   Sputum evaluation   Final    MICROSCOPIC FINDINGS SUGGEST THAT THIS SPECIMEN IS NOT REPRESENTATIVE OF LOWER RESPIRATORY SECRETIONS. PLEASE RECOLLECT. CALLED TO [place] [phone] 12/01/13 E.GADDY    Report Status 12/01/2013 FINAL  Final   Studies/Results: Dg Chest 2 View  11/30/2013   CLINICAL DATA:  Fall.  Cough.  EXAM: CHEST  2 VIEW  COMPARISON:  10/27/2013  FINDINGS: Persistent opacity in the right lung base appears to be increasing in size since  previous study. This may represent progression of pneumonia. Follow-up until resolution is recommended to exclude underlying obstructing lesion. Normal heart size and pulmonary vascularity. No blunting of costophrenic angles. No pneumothorax. Old fracture deformities of left upper ribs. Postoperative changes in the cervical spine. Degenerative changes in the thoracic spine.  IMPRESSION: Persistent right middle lung pneumonia. Follow-up until resolution is recommended. No new abnormalities since prior study.   Electronically Signed   By: Lucienne Capers M.D.   On: 11/30/2013 21:59   Dg Pelvis 1-2 Views  11/30/2013   CLINICAL DATA:  LEFT hip pain, tripped and fell today  EXAM: PELVIS - 1-2 VIEW  COMPARISON:  None  FINDINGS: Osseous mineralization grossly normal.  Hip and SI joint spaces preserved.  Patient rotated to the LEFT.  No acute fracture, dislocation or bone destruction.  Surgical clips at RIGHT inguinal region.  Scattered atherosclerotic calcifications.  IMPRESSION: No acute osseous abnormalities.   Electronically Signed   By: Lavonia Dana M.D.   On: 11/30/2013 21:58   Ct Head Wo Contrast  11/30/2013   CLINICAL DATA:  [place] today in bathroom, struck back of head on toilet. No loss of consciousness.  EXAM: CT HEAD WITHOUT CONTRAST  CT CERVICAL SPINE WITHOUT CONTRAST  TECHNIQUE: Multidetector CT imaging of the head and cervical spine was performed following the standard protocol without intravenous contrast. Multiplanar CT image reconstructions of the cervical spine were also generated.  COMPARISON:  CT of the head January 26, 2012  FINDINGS: CT HEAD FINDINGS  No intraparenchymal hemorrhage or acute large vascular territory infarct. However, there are new predominately isodense masses within the supratentorial brain including 2.6 x 2.7 cm RIGHT basal ganglia, 1.8 x 1.9 cm RIGHT corpus callosum and 1.9 x 1.9 cm LEFT frontal convexity masses. Surrounding low-density vasogenic edema, including partial  effacement of the lateral aspect of the frontal horn of the RIGHT lateral ventricle without hydrocephalus. No midline shift.  No abnormal extra-axial fluid collections. Moderate calcific atherosclerosis of the carotid siphons.  Small LEFT frontal/supraorbital scalp hematoma. No skull fracture. Mild paranasal sinus mucosal thickening without air-fluid levels. Ocular globes and orbital contents are unremarkable.Soft tissue within the external auditory canal likely reflects cerumen.  CT CERVICAL SPINE FINDINGS  Cervical vertebral bodies and posterior elements are intact and aligned and maintenance of cervical lordosis. C4-5 ACDF with solid interbody arthrodesis. Hardware appears intact and well seated without periprosthetic lucency. Mild C3-4 and C5-6 degenerative disc. C1-2 articulation maintained with arthropathy. Moderate calcific atherosclerosis of the carotid siphons.  IMPRESSION: CT HEAD: Small LEFT frontal scalp  hematoma. No skull fracture. No acute intracranial process.  At least 3 supratentorial masses, with imaging characteristics concerning for metastasis, though these may reflect subacute hematomas, it would be atypical for all to the of identical chronicity. No midline shift. Recommend MRI of the brain with contrast.  CT CERVICAL SPINE: No acute fracture nor malalignment.  Status post C4-5 ACDF with solid fusion.  Acute findings discussed with and reconfirmed by Dr.FORREST HARRISON on 11/30/2013 at 10:23 pm.   Electronically Signed   By: Elon Alas   On: 11/30/2013 22:24   Ct Chest W Contrast  12/01/2013   CLINICAL DATA:  Malignancy.  Possible intracranial metastases.  EXAM: CT CHEST, ABDOMEN, AND PELVIS WITH CONTRAST  TECHNIQUE: Multidetector CT imaging of the chest, abdomen and pelvis was performed following the standard protocol during bolus administration of intravenous contrast.  CONTRAST:  125mL OMNIPAQUE IOHEXOL 300 MG/ML  SOLN  COMPARISON:  CT scan of chest of September 27, 2013. CT scan  of head of November 30, 2013.  FINDINGS: CT CHEST FINDINGS  No pneumothorax or pleural effusion is noted. Airspace opacity is noted in the right middle lobe with air bronchograms most consistent with pneumonia. 28 x 14 mm fluid collection is noted within this area of consolidation containing air bubbles consistent with cavitating pneumonia. 3.6 mm nodule is noted in the left upper lobe best seen on image number 18 of series 2. Several healed nodular densities are noted in the right lower lobe most likely representing inflammation or infection. Subcarinal adenopathy measuring 2.4 x 1.8 cm is noted. Pretracheal lymph node measuring 15 x 10 mm is noted. Coronary artery calcifications are noted. Thoracic aorta appears normal. Multiple old left upper rib fractures are noted.  CT ABDOMEN AND PELVIS FINDINGS  No gallstones are noted. Mild splenomegaly is noted. The liver and pancreas appear normal. Adrenal glands and kidneys appear normal. Vascular calcifications are noted involving both kidneys. No hydronephrosis or renal obstruction is noted. No definite renal or ureteral calculi are noted. Atherosclerotic calcifications of abdominal aorta and iliac arteries are noted without aneurysm formation. There is no evidence of bowel obstruction. No abnormal fluid collection is noted in the abdomen or pelvis. No significant adenopathy is noted in the abdomen or pelvis.  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  IMPRESSION: Mild splenomegaly.  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 Sigmoidoscopy is recommended for further evaluation.  Enlarged pretracheal and subcarinal adenopathy is noted. It is uncertain if this cysts inflammatory or metastatic in origin.  Area of consolidation is noted in right middle lobe with associated 2.8 cm fluid collection centrally within this abnormality  concerning for cavitating pneumonia. Also noted are multiple ill-defined nodular densities in the right lower lobe most consistent with inflammation or pneumonia. Followup CT scan in 2-3 weeks is recommended to ensure resolution an rule out metastatic disease.  3.6 cm well-defined nodule is noted in the left upper lobe. Potentially this may represent metastatic disease if the patient does have primary malignancy, and continued CT follow-up is recommended.   Electronically Signed   By: Sabino Dick M.D.   On: 12/01/2013 10:41   Ct Cervical Spine Wo Contrast  11/30/2013   CLINICAL DATA:  [place] today in bathroom, struck back of head on toilet. No loss of consciousness.  EXAM: CT HEAD WITHOUT CONTRAST  CT CERVICAL SPINE WITHOUT CONTRAST  TECHNIQUE: Multidetector  CT imaging of the head and cervical spine was performed following the standard protocol without intravenous contrast. Multiplanar CT image reconstructions of the cervical spine were also generated.  COMPARISON:  CT of the head January 26, 2012  FINDINGS: CT HEAD FINDINGS  No intraparenchymal hemorrhage or acute large vascular territory infarct. However, there are new predominately isodense masses within the supratentorial brain including 2.6 x 2.7 cm RIGHT basal ganglia, 1.8 x 1.9 cm RIGHT corpus callosum and 1.9 x 1.9 cm LEFT frontal convexity masses. Surrounding low-density vasogenic edema, including partial effacement of the lateral aspect of the frontal horn of the RIGHT lateral ventricle without hydrocephalus. No midline shift.  No abnormal extra-axial fluid collections. Moderate calcific atherosclerosis of the carotid siphons.  Small LEFT frontal/supraorbital scalp hematoma. No skull fracture. Mild paranasal sinus mucosal thickening without air-fluid levels. Ocular globes and orbital contents are unremarkable.Soft tissue within the external auditory canal likely reflects cerumen.  CT CERVICAL SPINE FINDINGS  Cervical vertebral bodies and posterior  elements are intact and aligned and maintenance of cervical lordosis. C4-5 ACDF with solid interbody arthrodesis. Hardware appears intact and well seated without periprosthetic lucency. Mild C3-4 and C5-6 degenerative disc. C1-2 articulation maintained with arthropathy. Moderate calcific atherosclerosis of the carotid siphons.  IMPRESSION: CT HEAD: Small LEFT frontal scalp hematoma. No skull fracture. No acute intracranial process.  At least 3 supratentorial masses, with imaging characteristics concerning for metastasis, though these may reflect subacute hematomas, it would be atypical for all to the of identical chronicity. No midline shift. Recommend MRI of the brain with contrast.  CT CERVICAL SPINE: No acute fracture nor malalignment.  Status post C4-5 ACDF with solid fusion.  Acute findings discussed with and reconfirmed by Dr.FORREST HARRISON on 11/30/2013 at 10:23 pm.   Electronically Signed   By: Elon Alas   On: 11/30/2013 22:24   Mr Brain W Wo Contrast  12/01/2013   CLINICAL DATA:  Worsening lower extremity pain and edema with falls over the past 2 days. Left frontal head injury with 1 of the falls. Weight loss. IV drug abuse.  EXAM: MRI HEAD WITHOUT AND WITH CONTRAST  TECHNIQUE: Multiplanar, multiecho pulse sequences of the brain and surrounding structures were obtained without and with intravenous contrast.  CONTRAST:  19mL MULTIHANCE GADOBENATE DIMEGLUMINE 529 MG/ML IV SOLN  COMPARISON:  Head CT 11/30/2013 and MRI 10/21/2011  FINDINGS: There is no evidence of acute infarct or extra-axial fluid collection. Ring-enhancing masses demonstrate restricted diffusion and measured 2.4 x 2.1 cm in the posterior left frontal lobe, 2.3 x 2.1 cm in the right frontal centrum semiovale, and 2.7 x 2.6 cm at the level of the right basal ganglia. A small amount of susceptibility artifact within these lesions, most prominently in the right basal ganglia lesion, is compatible with a small amount of associated  blood products.  There is mild to moderate vasogenic edema surrounding these lesions. Minimal leftward midline shift measures 3-4 mm. There is partial effacement of the frontal horn of the right lateral ventricle. There is no evidence of hydrocephalus. Chronic bilateral basal ganglia lacunar infarcts are again noted. Chronic cortical infarct involving the left insula and left frontal operculum is again seen. There is mild generalized cerebral atrophy. Scattered, small foci of T2 hyperintensity in the cerebral white matter are nonspecific but may reflect mild chronic small vessel ischemic disease.  Orbits are unremarkable. Paranasal sinuses and mastoid air cells are clear. 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  IMPRESSION: Three ring-enhancing masses with  mild to moderate surrounding edema, highly concerning for cerebral abscesses. Necrotic metastases are an additional but less favored consideration.  Critical Value/emergent results were called by telephone at the time of interpretation on 12/01/2013 at 10:14 am to Dr. Hulen Luster, who verbally acknowledged these results.   Electronically Signed   By: Logan Bores   On: 12/01/2013 10:18   Ct Abdomen Pelvis W Contrast  12/01/2013   CLINICAL DATA:  Malignancy.  Possible intracranial metastases.  EXAM: CT CHEST, ABDOMEN, AND PELVIS WITH CONTRAST  TECHNIQUE: Multidetector CT imaging of the chest, abdomen and pelvis was performed following the standard protocol during bolus administration of intravenous contrast.  CONTRAST:  159mL OMNIPAQUE IOHEXOL 300 MG/ML  SOLN  COMPARISON:  CT scan of chest of September 27, 2013. CT scan of head of November 30, 2013.  FINDINGS: CT CHEST FINDINGS  No pneumothorax or pleural effusion is noted. Airspace opacity is noted in the right middle lobe with air bronchograms most consistent with pneumonia. 28 x 14 mm fluid collection is noted within this area of consolidation containing air bubbles consistent with  cavitating pneumonia. 3.6 mm nodule is noted in the left upper lobe best seen on image number 18 of series 2. Several healed nodular densities are noted in the right lower lobe most likely representing inflammation or infection. Subcarinal adenopathy measuring 2.4 x 1.8 cm is noted. Pretracheal lymph node measuring 15 x 10 mm is noted. Coronary artery calcifications are noted. Thoracic aorta appears normal. Multiple old left upper rib fractures are noted.  CT ABDOMEN AND PELVIS FINDINGS  No gallstones are noted. Mild splenomegaly is noted. The liver and pancreas appear normal. Adrenal glands and kidneys appear normal. Vascular calcifications are noted involving both kidneys. No hydronephrosis or renal obstruction is noted. No definite renal or ureteral calculi are noted. Atherosclerotic calcifications of abdominal aorta and iliac arteries are noted without aneurysm formation. There is no evidence of bowel obstruction. No abnormal fluid collection is noted in the abdomen or pelvis. No significant adenopathy is noted in the abdomen or pelvis.  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  IMPRESSION: Mild splenomegaly.  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 Sigmoidoscopy is recommended for further evaluation.  Enlarged pretracheal and subcarinal adenopathy is noted. It is uncertain if this cysts inflammatory or metastatic in origin.  Area of consolidation is noted in right middle lobe with associated 2.8 cm fluid collection centrally within this abnormality concerning for cavitating pneumonia. Also noted are multiple ill-defined nodular densities in the right lower lobe most consistent with inflammation or pneumonia. Followup CT scan in 2-3 weeks is recommended to ensure resolution an rule out metastatic disease.  3.6 cm well-defined nodule is noted in the left upper lobe.  Potentially this may represent metastatic disease if the patient does have primary malignancy, and continued CT follow-up is recommended.   Electronically Signed   By: Sabino Dick M.D.   On: 12/01/2013 10:41   Medications: I have reviewed the patient's current medications. Scheduled Meds: . cefTRIAXone (ROCEPHIN)  IV  2 g Intravenous Q12H  . enoxaparin (LOVENOX) injection  40 mg Subcutaneous Q24H  . famotidine  10 mg Oral BID  . feeding supplement (GLUCERNA SHAKE)  237 mL Oral BID BM  . fenofibrate  54 mg Oral Daily  . Influenza vac split quadrivalent PF  0.5 mL Intramuscular Tomorrow-1000  . metronidazole  1 g Intravenous Once  . metronidazole  500 mg Intravenous Q6H  . mometasone-formoterol  2 puff Inhalation BID  . multivitamin with minerals  1 tablet Oral Daily  . neomycin-bacitracin-polymyxin   Topical BID  . nicotine  21 mg Transdermal Q24H  . pantoprazole  40 mg Oral BID  . polyethylene glycol  17 g Oral BID  . [START ON 12/04/2013] sodium phosphate  2 enema Rectal Once  . vancomycin  750 mg Intravenous Q8H   Continuous Infusions:  PRN Meds:.acetaminophen, albuterol, diclofenac sodium, hydrocortisone cream, hydrOXYzine, ipratropium-albuterol, morphine injection Assessment/Plan: Principal Problem:   Brain mass Active Problems:   Dyslipidemia   Anxiety state   HYPERTENSION, BENIGN ESSENTIAL   Peripheral vascular disease   COPD (chronic obstructive pulmonary disease)   Tobacco abuse   Abnormality of gait   Loss of weight   Malnutrition   Pneumonia   Heroin abuse   Protein-calorie malnutrition, severe   Malignancy   Rectal mass   Supratentorial Masses on CT: ED physician spoke with neurosurgeon, Dr. Ronnald Ramp, who recommended MRI without need for acute intervention. The masses could represent metastasis, subacute hematomas or septic emboli.  - MRI brain w/ and w/o revealed 3 abscesses - CT abdomen/pelvis and chest revealed possible apple core lesion and malignancy in the  rectum, enlarged pretracheal and subcarinal adenopathy 2/2 inflammatory cysts vs metastasis, 2.8cm fluid collection in rt middle lobe concerning for cavitating PNA, multiple nodular densities in RUL PNA vs inflammation, 3.6cm well defined nodule in LUL concerning for metastatic disease. CT f/u recommended.  - TEE ordered 2/2 IVDU, blood cultures pending.  - Neuro checks - Neurosurgery deferred brain biopsy, recommend tx w/ abx - Dr. Baxter Flattery w/ ID following, recommend to start vanc, flagyl, and rocephin. Loaded w/ rocephin 1g once then rocephin 2g q12 hours.   Metastatic disease vs Pneumonia: Was treated for PNA in 09/2013 with azithromycin, then had infiltrative finding on CXR in 10/2013. Has persistent infiltrate in RML suggestive of pneumonia today. Has remote history of TB. - CT chest results noted above, likely pt has metastatic disease, will continue to work up - abx as noted above  - Sputum culture needs to be recollected.   Lower Extremity pain: This was first noted in clinic notes in 09/2013; perhaps related to early right-sided heart failure that is a consequence of his progressive emphysema.  - morphine 2mg  q4h scheduled for pain - Lower extremity dopplers ordered  Fever: 101.3 deg F on admission, Tmax of 100.5 overnight.  - Tylenol for fever and pain - Sputum, blood and urine cultures pending  Polysubstance Abuse: 50 pack year smoker who started using IV heroin about a year ago when his pain medications were not renewed. States that he does not share needles. Patient admits to heroin use yesterday. He does not appear to be in withdrawal (which peaks 30-56 hours after last use of heroin); he has mydriasis, but normal VS, no piloerection, no diaphoresis, no vomiting/diarrhea. - Started nicotine patch in 09/2013, but patient has not been using it. Resumed in hospital. - last heroin use 2 days ago, uses 2-4 bags per day. Morphine 2mg  q4h for withdrawal, can increase to q2h if needed.  Currently no in withdrawal - CSW; patient is interested in quitting heroin - concern for wife possibly give pt drugs, will consider camera room when available (per nurse no camera rooms are avail at this time)  COPD: Progressive emphysema; smoking history of 50 pack years. Had recent PFTs. Uses combivent and  advair. - Continue home medications  Anxiety: May be contributing to his dyspnea, as per prior clinic notes. Has taken klonopin in the past; no longer written for klonopin by [facility]. Obtained new rx from a psychiatrist affiliated with his therapist. - Continue klonopin, was not ordered last night but reordered today  DMII: Stable and well-controlled on metformin. Hold metformin can start sliding scale sensitive if CBGs elevated - CBGs 84-90, continue CBG checks q4  Essential Hypertension: Currently normotensive. -hold home meds  DVT Ppx:  - Lovenox  Diet:  - Carb mod  Dispo: Disposition is deferred at this time, awaiting improvement of current medical problems. Anticipated discharge in approximately 5 day(s).   The patient does have a current PCP (Kelby Aline, MD) and does need an [facility] hospital follow-up appointment after discharge.  The patient does have transportation limitations that hinder transportation to clinic appointments.  .Services Needed at time of discharge: Y = Yes, Blank = No PT:   OT:   RN:   Equipment:   Other:     LOS: 2 days   [name] Oka, MD 12/02/2013, 10:42 AM

## 2013-12-02 NOTE — Evaluation (Addendum)
Occupational Therapy Evaluation Patient Details Name: [name] MRN: [id number] DOB: [birth year] Today's Date: 12/02/2013    History of Present Illness Patient is a 53 yo male admitted 11/30/13 with BLE pain and edema, with multiple falls.  Patient with h/o brain mass, CVA, PVD, COPD, CHF, IV heroin use, Hep C, CHF, DM.   Clinical Impression   Pt lethargic in session but wanted to move with therapy. Session limited due to lethargy/cognition. Feel pt will benefit from acute OT to increase strength, balance, and independence with BADLs, PTA. Recommending SNF for rehab.     Follow Up Recommendations  SNF;Supervision/Assistance - 24 hour    Equipment Recommendations  Other (comment) (defer to next venue)    Recommendations for Other Services       Precautions / Restrictions Precautions Precautions: Fall Precaution Comments: Patient very impulsive with decreased safety awareness Restrictions Weight Bearing Restrictions: No      Mobility Bed Mobility Overal bed mobility: Needs Assistance Bed Mobility: Supine to Sit;Sit to Supine     Supine to sit: Max assist Sit to supine: +2 for physical assistance;Max assist   General bed mobility comments: assist with legs and trunk. pt lethargic and saying he has lost ability of legs and arms. Cues for technique. +2 assist to scoot HOB.   Transfers Overall transfer level: Needs assistance Equipment used: Rolling walker (2 wheeled) Transfers: Sit to/from Stand Sit to Stand: +2 physical assistance;Max assist         General transfer comment: cues for technique and to position feet. heavy assist to stand. Unable to take steps. Stood a couple times from bed with heavy assist.    Balance Overall balance assessment: Needs assistance Sitting-balance support: Feet supported; single extremity supported Sitting balance-Leahy Scale: Poor                                      ADL Overall ADL's : Needs  assistance/impaired                     Lower Body Dressing: +2 for physical assistance;Total assistance;Sit to/from stand   Toilet Transfer: +2 for physical assistance;Maximal assistance;RW (sit to stand from bed)             General ADL Comments: Pt sat EOB with assistance for balance. Session limited. Pt able to raise one leg when OT requested to try to attempt LB dressing but pt total A for donning socks.      Vision                     Perception     Praxis      Pertinent Vitals/Pain Pain Assessment: Faces Faces Pain Scale: Hurts little more Pain Location: reports hurts all over Pain Intervention(s): Repositioned;Monitored during session     Hand Dominance     Extremity/Trunk Assessment Upper Extremity Assessment Upper Extremity Assessment: Difficult to assess due to impaired cognition (lethargic as well)   Lower Extremity Assessment Lower Extremity Assessment: Defer to PT evaluation       Communication Communication Communication: Expressive difficulties   Cognition Arousal/Alertness: Awake/alert Behavior During Therapy: Flat affect Overall Cognitive Status: Impaired/Different from baseline Area of Impairment: Following commands;Problem solving;Safety/judgement       Following Commands: Follows one step commands with increased time;Follows one step commands inconsistently Safety/Judgement: Decreased awareness of safety   Problem Solving: Slow processing;Decreased initiation;Requires verbal  cues;Requires tactile cues     General Comments       Exercises       Shoulder Instructions      Home Living Family/patient expects to be discharged to:: Skilled nursing facility Living Arrangements: Alone (per patient, with wife in hotel)                           Home Equipment: Kasandra Knudsen - single point          Prior Functioning/Environment Level of Independence: Independent with assistive device(s)        Comments:  Unclear of patient's abilities pta with ADL's.    OT Diagnosis: Generalized weakness;Cognitive deficits;Acute pain   OT Problem List: Decreased strength;Decreased cognition;Impaired balance (sitting and/or standing);Decreased activity tolerance;Pain;Decreased knowledge of precautions;Decreased knowledge of use of DME or AE;Decreased safety awareness   OT Treatment/Interventions: Self-care/ADL training;DME and/or AE instruction;Therapeutic activities;Patient/family education;Balance training;Cognitive remediation/compensation    OT Goals(Current goals can be found in the care plan section) Acute Rehab OT Goals Patient Stated Goal: None stated OT Goal Formulation: Patient unable to participate in goal setting Time For Goal Achievement: 12/16/13 Potential to Achieve Goals: Fair ADL Goals Pt Will Perform Grooming: with min assist;standing Pt Will Perform Upper Body Bathing: with min assist;sitting Pt Will Perform Lower Body Bathing: with min assist;sit to/from stand Pt Will Transfer to Toilet: with min assist;ambulating Pt Will Perform Toileting - Clothing Manipulation and hygiene: with min assist;sit to/from stand  OT Frequency: Min 2X/week   Barriers to D/C:            Co-evaluation              End of Session Equipment Utilized During Treatment: Gait belt;Rolling walker Nurse Communication: Other (comment) (came in during session)  Activity Tolerance: Other (comment);Patient limited by lethargy;Patient limited by pain (cognition) Patient left: in bed;with call bell/phone within reach;with bed alarm set   Time: 6333-5456 OT Time Calculation (min): 15 min Charges:  OT General Charges $OT Visit: 1 Procedure OT Evaluation $Initial OT Evaluation Tier I: 1 Procedure  OT treatment: $Therapeutic activity: 8-22 min G-CodesBenito Mccreedy OTR/L [phone] 12/02/2013, 2:38 PM

## 2013-12-03 ENCOUNTER — Encounter (HOSPITAL_COMMUNITY): Payer: Self-pay | Admitting: Radiology

## 2013-12-03 DIAGNOSIS — E46 Unspecified protein-calorie malnutrition: Secondary | ICD-10-CM

## 2013-12-03 LAB — GLUCOSE, CAPILLARY
GLUCOSE-CAPILLARY: 98 mg/dL (ref 70–99)
Glucose-Capillary: 114 mg/dL — ABNORMAL HIGH (ref 70–99)
Glucose-Capillary: 157 mg/dL — ABNORMAL HIGH (ref 70–99)
Glucose-Capillary: 119 mg/dL — ABNORMAL HIGH (ref 70–99)

## 2013-12-03 LAB — VANCOMYCIN, TROUGH: Vancomycin Tr: 14 ug/mL (ref 10.0–20.0)

## 2013-12-03 MED ORDER — FLEET ENEMA 7-19 GM/118ML RE ENEM
1.0000 | ENEMA | Freq: Once | RECTAL | Status: AC
  Administered 2013-12-04: 1 via RECTAL
  Filled 2013-12-03: qty 1

## 2013-12-03 MED ORDER — KETOROLAC TROMETHAMINE 30 MG/ML IJ SOLN
30.0000 mg | Freq: Once | INTRAMUSCULAR | Status: AC | PRN
  Administered 2013-12-03: 30 mg via INTRAVENOUS
  Filled 2013-12-03: qty 1

## 2013-12-03 MED ORDER — SODIUM CHLORIDE 0.9 % IV SOLN
INTRAVENOUS | Status: DC
  Administered 2013-12-03: 08:00:00 via INTRAVENOUS

## 2013-12-03 MED ORDER — VANCOMYCIN HCL IN DEXTROSE 1-5 GM/200ML-% IV SOLN
1000.0000 mg | Freq: Three times a day (TID) | INTRAVENOUS | Status: DC
  Administered 2013-12-04 – 2013-12-06 (×6): 1000 mg via INTRAVENOUS
  Filled 2013-12-03 (×12): qty 200

## 2013-12-03 NOTE — Progress Notes (Signed)
Nurse tech, Jonelle Sidle, informed nurse that while she was bathing pt. Pt stated that he wants to put a gun to his head and kill himself.  Nurse asked pt if he has thoughts of hurting himself or others. Pt stated, "not others, but myself."  Nurse informed charge RN, Sondra Barges.  Tami spoke with pt informing him that a sitter will have to be at bedside. Tami to notify house manager for request of a sitter. Nurse paged MD. Dr. Hulen Luster and informed.  Will continue to closely monitor pt   Angeline Slim I 12/03/2013 3:30 PM

## 2013-12-03 NOTE — Progress Notes (Signed)
Nurse tech, Jonelle Sidle, feeding pt but pt constantly coughing and difficulty swallowing food.  No acute distress noted.  Nurse paged Dr. Hulen Luster, MD. Md called back and nurse informed.  MD stated she will place pt on liquid diet.  Will continue to monitor pt closely   Angeline Slim I 12/03/2013 11:03 AM

## 2013-12-03 NOTE — Progress Notes (Signed)
ANTIBIOTIC CONSULT NOTE - FOLLOW UP  Pharmacy Consult for Vancomycin Indication: Brain abscesses  Allergies  Allergen Reactions  . Fish Allergy Hives, Swelling and Rash  . Buprenorphine Hcl-Naloxone Hcl Other (See Comments)    "feel like going to die" jittery, trouble breathing, feels hot  . Shellfish Allergy Hives  . Benadryl [Diphenhydramine Hcl] Hives, Itching and Rash    Patient Measurements: Height: 6\' 1"  (185.4 cm) Weight: 136 lb 11 oz (62 kg) IBW/kg (Calculated) : 79.9  Vital Signs: Temp: 98.2 F (36.8 C) (11/22 2022) Temp Source: Oral (11/22 2022) BP: 172/80 mmHg (11/22 2022) Pulse Rate: 90 (11/22 2022) Intake/Output from previous day: 11/21 0701 - 11/22 0700 In: -  Out: 4500 [Urine:4500] Intake/Output from this shift:    Labs:  Recent Labs  11/30/13 2216 12/01/13 0414 12/02/13 0624  WBC 10.3 10.4 10.6*  HGB 12.5* 10.5* 11.3*  PLT 434* 375 326  CREATININE 0.65 0.68 0.67   Estimated Creatinine Clearance: 91.5 mL/min (by C-G formula based on Cr of 0.67).  Recent Labs  12/03/13 2055  [place] 14.0     Microbiology: Recent Results (from the past 720 hour(s))  Urine culture     Status: None   Collection Time: 11/30/13 11:20 PM  Result Value Ref Range Status   Specimen Description URINE, CLEAN CATCH  Final   Special Requests NONE  Final   Culture  Setup Time   Final    12/01/2013 09:01 Performed at [place] Performed at [facility]   Final   Culture NO GROWTH Performed at [facility]   Final   Report Status 12/02/2013 FINAL  Final  Blood culture (routine x 2)     Status: None (Preliminary result)   Collection Time: 12/01/13 12:05 AM  Result Value Ref Range Status   Specimen Description BLOOD RIGHT ARM  Final   Special Requests BOTTLES DRAWN AEROBIC AND ANAEROBIC 5CC  Final   Culture  Setup Time   Final    12/01/2013 04:15 Performed at [facility]    Culture   Final            BLOOD CULTURE RECEIVED NO GROWTH TO DATE CULTURE WILL BE HELD FOR 5 DAYS BEFORE ISSUING A FINAL NEGATIVE REPORT Performed at [facility]    Report Status PENDING  Incomplete  Blood culture (routine x 2)     Status: None (Preliminary result)   Collection Time: 12/01/13 12:08 AM  Result Value Ref Range Status   Specimen Description BLOOD RIGHT HAND  Final   Special Requests BOTTLES DRAWN AEROBIC AND ANAEROBIC 5CC  Final   Culture  Setup Time   Final    12/01/2013 04:15 Performed at [facility]    Culture   Final           BLOOD CULTURE RECEIVED NO GROWTH TO DATE CULTURE WILL BE HELD FOR 5 DAYS BEFORE ISSUING A FINAL NEGATIVE REPORT Performed at [facility]    Report Status PENDING  Incomplete  Culture, expectorated sputum-assessment     Status: None   Collection Time: 12/01/13  4:33 AM  Result Value Ref Range Status   Specimen Description SPUTUM  Final   Special Requests NONE  Final   Sputum evaluation   Final    MICROSCOPIC FINDINGS SUGGEST THAT THIS SPECIMEN IS NOT REPRESENTATIVE OF LOWER RESPIRATORY SECRETIONS. PLEASE RECOLLECT. CALLED TO [place] [phone] 12/01/13 E.GADDY    Report  Status 12/01/2013 FINAL  Final    Anti-infectives    Start     Dose/Rate Route Frequency Ordered Stop   12/02/13 2200  vancomycin (VANCOCIN) IVPB 750 mg/150 ml premix     750 mg150 mL/hr over 60 Minutes Intravenous Every 8 hours 12/02/13 1534     12/02/13 1400  metroNIDAZOLE (FLAGYL) IVPB 500 mg     500 mg100 mL/hr over 60 Minutes Intravenous Every 6 hours 12/02/13 0755     12/02/13 0930  vancomycin (VANCOCIN) IVPB 750 mg/150 ml premix  Status:  Discontinued     750 mg150 mL/hr over 60 Minutes Intravenous Every 8 hours 12/02/13 0835 12/02/13 1534   12/02/13 0900  cefTRIAXone (ROCEPHIN) 2 g in dextrose 5 % 50 mL IVPB - Premix     2 g100 mL/hr over 30 Minutes Intravenous Every 12 hours 12/02/13 0835     12/02/13 0800  cefTRIAXone (ROCEPHIN) 1 g in dextrose 5 % 50 mL  IVPB - Premix  Status:  Discontinued     1 g100 mL/hr over 30 Minutes Intravenous Every 24 hours 12/02/13 0752 12/02/13 0835   12/02/13 0800  metroNIDAZOLE (FLAGYL) IVPB 1 g     1 g200 mL/hr over 60 Minutes Intravenous  Once 12/02/13 0752 12/02/13 1109   12/01/13 0600  ceFEPIme (MAXIPIME) 1 g in dextrose 5 % 50 mL IVPB  Status:  Discontinued     1 g100 mL/hr over 30 Minutes Intravenous 3 times per day 12/01/13 0301 12/01/13 1039   12/01/13 0315  vancomycin (VANCOCIN) IVPB 1000 mg/200 mL premix  Status:  Discontinued     1,000 mg200 mL/hr over 60 Minutes Intravenous Every 12 hours 12/01/13 0301 12/01/13 1039   11/30/13 2345  vancomycin (VANCOCIN) IVPB 1000 mg/200 mL premix  Status:  Discontinued     1,000 mg200 mL/hr over 60 Minutes Intravenous  Once 11/30/13 2344 12/01/13 1039   11/30/13 2345  ceFEPIme (MAXIPIME) 2 g in dextrose 5 % 50 mL IVPB     2 g100 mL/hr over 30 Minutes Intravenous  Once 11/30/13 2344 12/01/13 0109   11/30/13 2330  cefTRIAXone (ROCEPHIN) 1 g in dextrose 5 % 50 mL IVPB  Status:  Discontinued     1 g100 mL/hr over 30 Minutes Intravenous  Once 11/30/13 2329 11/30/13 2331   11/30/13 2330  azithromycin (ZITHROMAX) 500 mg in dextrose 5 % 250 mL IVPB  Status:  Discontinued     500 mg250 mL/hr over 60 Minutes Intravenous  Once 11/30/13 2329 11/30/13 2331      Assessment: 47 YOM who continues on Vancomycin-Rx + CTX/Flagyl - MD for empiric coverage of brain abscess/cavitary PNA. A Vancomycin trough this evening was slightly SUBtherapeutic (VT 14 mcg/ml, goal of 15-20 mcg/ml). Renal function stable - will increase Vancomycin dose this evening.  Goal of Therapy:  Vancomycin trough level 15-20 mcg/ml  Plan:  1. Increase Vancomycin to 1g IV every 8 hours 2. Will continue to follow renal function, culture results, LOT, and antibiotic de-escalation plans   Alycia Rossetti, PharmD, BCPS Clinical Pharmacist Pager: [phone] 12/03/2013 10:07 PM

## 2013-12-03 NOTE — Progress Notes (Signed)
    Progress Note   Subjective  Thirsty. States he has lost function of hands and legs.    Objective   Vital signs in last 24 hours: Temp:  [97.9 F (36.6 C)-101.7 F (38.7 C)] 98.9 F (37.2 C) (11/22 0949) Pulse Rate:  [93-123] 94 (11/22 0949) Resp:  [21-28] 22 (11/22 0949) BP: (140-168)/(78-89) 159/81 mmHg (11/22 0949) SpO2:  [97 %-99 %] 99 % (11/22 0949) Weight:  [136 lb 11 oz (62 kg)] 136 lb 11 oz (62 kg) (11/22 0500) Last BM Date:  (pt stated cannot remember LBM) General:    Thin white male in NAD Abdomen:  Soft, nontender and nondistended. Normal bowel sounds. Neurologic:  Alert and oriented.  Psych:  Cooperative.    Lab Results:  Recent Labs  11/30/13 2216 12/01/13 0414 12/02/13 0624  WBC 10.3 10.4 10.6*  HGB 12.5* 10.5* 11.3*  HCT 38.3* 32.8* 35.1*  PLT 434* 375 326   BMET  Recent Labs  11/30/13 2216 12/01/13 0414 12/02/13 0624  NA 134* 139 134*  K 4.2 3.6* 4.1  CL 95* 102 98  CO2 24 25 21   GLUCOSE 109* 97 106*  BUN 10 11 9   CREATININE 0.65 0.68 0.67  CALCIUM 9.1 8.5 8.5   LFT  Recent Labs  12/01/13 0414  PROT 7.6  ALBUMIN 2.4*  AST 60*  ALT 34  ALKPHOS 73  BILITOT 0.4     Assessment / Plan:   1. 53 yo male with constipation and rectal lesion on CTscan, rule out malignancy. Plan is for sigmoidoscopy tomorrow. Will order 0700 enema X2.  2. Brain lesions, abscess vrs metastatic lesions. Hx IVDU. For TEE tomorrow. On 3 antibiotics, if no response Neurosurgery recommends biopsy. Of note, he complains of upper and lower extremity weakness. MRI spine was ordered.  3. Abnormal CTchest - PNA and pulmonary nodules , rule out metastatic disease.   4. Coughing with PO intake. NPO now, SLP pending.   5.  Hx of IVDU. Also, urine tox + cocaine / Marijuana / opiates     LOS: 3 days   Tye Savoy  12/03/2013, 12:50 PM  Attending MD note:   I have taken a history,  and reviewed the chart. I agree with the Advanced Practitioner's impression  and recommendations. Limited prep for flex sigmoidoscopy to clarify apple core narrowing in the rectum on CT sca of the abdomen last week.  [facility] Pager # [phone]
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
## 2013-12-03 NOTE — Plan of Care (Signed)
Problem: Phase I Progression Outcomes Goal: OOB as tolerated unless otherwise ordered Outcome: Progressing     

## 2013-12-03 NOTE — Progress Notes (Signed)
Pt complained of pain to calf area in LLE.  Pt unable to bend foot.  Pt stated pain was sudden.  Nurse paged MD, Dr. Hulen Luster. Md called back and nurse informed.  Md stated that bilat lower ext venous doppler performed yesterday, negative.  No new orders.  Nurse will continue to monitor pt closely.   [name] I 12/03/2013 10:50 AM

## 2013-12-03 NOTE — Plan of Care (Signed)
Problem: Consults Goal: General Medical Patient Education See Patient Education Module for specific education. Outcome: Completed/Met Date Met:  12/03/13     

## 2013-12-04 ENCOUNTER — Inpatient Hospital Stay (HOSPITAL_COMMUNITY): Payer: Medicaid Other

## 2013-12-04 ENCOUNTER — Encounter (HOSPITAL_COMMUNITY)
Admission: EM | Disposition: A | Discharge status: Skilled Nursing Facility | Payer: Self-pay | Source: Home / Self Care | Attending: Internal Medicine

## 2013-12-04 ENCOUNTER — Inpatient Hospital Stay (HOSPITAL_COMMUNITY): Payer: Medicaid Other | Admitting: Certified Registered Nurse Anesthetist

## 2013-12-04 ENCOUNTER — Encounter (HOSPITAL_COMMUNITY): Payer: Self-pay

## 2013-12-04 DIAGNOSIS — R935 Abnormal findings on diagnostic imaging of other abdominal regions, including retroperitoneum: Secondary | ICD-10-CM

## 2013-12-04 DIAGNOSIS — R45851 Suicidal ideations: Secondary | ICD-10-CM

## 2013-12-04 DIAGNOSIS — F1994 Other psychoactive substance use, unspecified with psychoactive substance-induced mood disorder: Secondary | ICD-10-CM

## 2013-12-04 DIAGNOSIS — R1310 Dysphagia, unspecified: Secondary | ICD-10-CM

## 2013-12-04 DIAGNOSIS — R933 Abnormal findings on diagnostic imaging of other parts of digestive tract: Secondary | ICD-10-CM

## 2013-12-04 HISTORY — PX: FLEXIBLE SIGMOIDOSCOPY: SHX5431

## 2013-12-04 LAB — GLUCOSE, CAPILLARY: Glucose-Capillary: 123 mg/dL — ABNORMAL HIGH (ref 70–99)

## 2013-12-04 SURGERY — SIGMOIDOSCOPY, FLEXIBLE
Anesthesia: Monitor Anesthesia Care

## 2013-12-04 SURGERY — SIGMOIDOSCOPY, FLEXIBLE
Anesthesia: Moderate Sedation

## 2013-12-04 MED ORDER — OXYCODONE HCL 5 MG/5ML PO SOLN: 5.0000 mg | Freq: Once | ORAL | Status: AC | PRN

## 2013-12-04 MED ORDER — DEXAMETHASONE SODIUM PHOSPHATE 10 MG/ML IJ SOLN
10.0000 mg | Freq: Once | INTRAMUSCULAR | Status: AC
  Administered 2013-12-04: 10 mg via INTRAVENOUS
  Filled 2013-12-04: qty 1

## 2013-12-04 MED ORDER — ONDANSETRON HCL 4 MG/2ML IJ SOLN: 4.0000 mg | Freq: Once | INTRAMUSCULAR | Status: AC | PRN

## 2013-12-04 MED ORDER — HYDROMORPHONE HCL 1 MG/ML IJ SOLN: 0.2500 mg | INTRAMUSCULAR | Status: DC | PRN

## 2013-12-04 MED ORDER — WHITE PETROLATUM GEL
Status: AC
  Administered 2013-12-04: 0.2
  Filled 2013-12-04: qty 5

## 2013-12-04 MED ORDER — GADOBENATE DIMEGLUMINE 529 MG/ML IV SOLN
12.0000 mL | Freq: Once | INTRAVENOUS | Status: AC | PRN
Start: 2013-12-04 — End: 2013-12-04
  Administered 2013-12-04: 12 mL via INTRAVENOUS

## 2013-12-04 MED ORDER — PROPOFOL INFUSION 10 MG/ML OPTIME
INTRAVENOUS | Status: DC | PRN
Start: 2013-12-04 — End: 2013-12-04
  Administered 2013-12-04: 75 ug/kg/min via INTRAVENOUS

## 2013-12-04 MED ORDER — SODIUM CHLORIDE 0.9 % IV SOLN
INTRAVENOUS | Status: DC | PRN
  Administered 2013-12-04: 14:00:00 via INTRAVENOUS

## 2013-12-04 MED ORDER — FENTANYL CITRATE 0.05 MG/ML IJ SOLN
INTRAMUSCULAR | Status: AC
  Filled 2013-12-04: qty 2

## 2013-12-04 MED ORDER — DEXAMETHASONE SODIUM PHOSPHATE 4 MG/ML IJ SOLN
4.0000 mg | Freq: Four times a day (QID) | INTRAMUSCULAR | Status: DC
  Administered 2013-12-05 – 2013-12-17 (×48): 4 mg via INTRAVENOUS
  Filled 2013-12-04 (×53): qty 1

## 2013-12-04 MED ORDER — FENTANYL CITRATE 0.05 MG/ML IJ SOLN
INTRAMUSCULAR | Status: DC | PRN
  Administered 2013-12-04 (×2): 50 ug via INTRAVENOUS

## 2013-12-04 MED ORDER — LORAZEPAM 2 MG/ML IJ SOLN
1.0000 mg | Freq: Once | INTRAMUSCULAR | Status: AC | PRN
  Administered 2013-12-04: 1 mg via INTRAVENOUS
  Filled 2013-12-04: qty 1

## 2013-12-04 MED ORDER — OXYCODONE HCL 5 MG PO TABS: 5.0000 mg | ORAL_TABLET | Freq: Once | ORAL | Status: AC | PRN

## 2013-12-04 MED ORDER — MORPHINE SULFATE 2 MG/ML IJ SOLN
2.0000 mg | INTRAMUSCULAR | Status: DC | PRN
  Administered 2013-12-04 – 2013-12-06 (×11): 2 mg via INTRAVENOUS
  Filled 2013-12-04 (×12): qty 1

## 2013-12-04 MED ORDER — MEPERIDINE HCL 25 MG/ML IJ SOLN: 6.2500 mg | INTRAMUSCULAR | Status: DC | PRN

## 2013-12-04 MED ORDER — LIDOCAINE HCL (CARDIAC) 20 MG/ML IV SOLN
INTRAVENOUS | Status: DC | PRN
  Administered 2013-12-04: 60 mg via INTRAVENOUS

## 2013-12-04 MED ORDER — SODIUM CHLORIDE 0.9 % IV SOLN
INTRAVENOUS | Status: DC
  Administered 2013-12-04 – 2013-12-11 (×2): via INTRAVENOUS
  Administered 2013-12-15: 20 mL/h via INTRAVENOUS

## 2013-12-04 NOTE — Progress Notes (Deleted)
A single clip was placed over a shallow postpolypectomy ulcer (with red spot). Regular (cardiac) diet ordered.

## 2013-12-04 NOTE — Progress Notes (Signed)
Patient ID: COULTER OLDAKER, male   DOB: November 29, 1960, 53 y.o.   MRN: [id number]  Patient chart reviewed, case discussed with the staff RN but patient was not seen today because he was out of the unit for the barium swallow and gastroenterology procedures. We will try to evaluate again today or tomorrow morning.  Shakim Faith,JANARDHAHA R. 12/04/2013 12:57 PM
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
## 2013-12-04 NOTE — Progress Notes (Signed)
PT Cancellation Note  Patient Details Name: [name] MRN: [id number] DOB: 08/07/1960   Cancelled Treatment:    Reason Eval/Treat Not Completed: Patient at procedure or test/unavailable   [name] 12/04/2013, 11:31 AM Pager [phone]

## 2013-12-04 NOTE — Progress Notes (Signed)
Subjective: Pt complaining of back pain even though he just received dose of 2mg  morphine. Laying in bed comfortably. Sons at bedside.  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 States person feeding him yesterday was feeding him huge tablespoons and feeding him too fast causing pt to vomit. States pt is not a threat to himself and has made suicidal comments in the past that they believe were not real. When asked if pt wants to hurt himself pt says yes.   Objective: Vital signs in last 24 hours: Filed Vitals:   12/04/13 0156 12/04/13 0458 12/04/13 0628 12/04/13 0908  BP: 144/85  149/85   Pulse: 112  103   Temp: 98.2 F (36.8 C)  97.7 F (36.5 C)   TempSrc: Oral  Oral   Resp: 20  18   Height:      Weight:  137 lb 2 oz (62.2 kg)    SpO2: 97%  94% 96%   Weight change: 7.1 oz (0.2 kg)  Intake/Output Summary (Last 24 hours) at 12/04/13 0953 Last data filed at 12/04/13 6546  Gross per 24 hour  Intake      0 ml  Output   1850 ml  Net  -1850 ml   PE General: NAD HEENT: missing upper teeth, poor dentition on lower teeth. Lungs: CTAB on anterior auscultation, unable to sit up due pain, no wheezes Cardiac: RRR, no murmurs GI: active bowel sounds, soft, scaphoid abdomen  Ext: unable to wiggle toes b/l or lift legs off the bed. Visible attempt to lift lt leg off bed. Intact sensation LE b/l. LLE no longer erythematous   Lab Results: Basic Metabolic Panel:  Recent Labs Lab 12/01/13 0414 12/02/13 0624  NA 139 134*  K 3.6* 4.1  CL 102 98  CO2 25 21  GLUCOSE 97 106*  BUN 11 9  CREATININE 0.68 0.67  CALCIUM 8.5 8.5  MG 2.0  --    Liver Function Tests:  Recent Labs Lab 11/30/13 2216 12/01/13 0414  AST 82* 60*  ALT 40 34  ALKPHOS 77 73  BILITOT 0.4 0.4  PROT 8.8* 7.6  ALBUMIN 2.6* 2.4*   CBC:  Recent Labs Lab 12/01/13 0414 12/02/13 0624  WBC 10.4 10.6*  NEUTROABS 6.9 7.3  HGB 10.5* 11.3*    HCT 32.8* 35.1*  MCV 81.0 80.7  PLT 375 326   Cardiac Enzymes:  Recent Labs Lab 11/30/13 2216  TROPONINI <0.30   BNP:  Recent Labs Lab 11/30/13 2216  PROBNP 397.8*   CBG:  Recent Labs Lab 12/02/13 2145 12/03/13 0621 12/03/13 1154 12/03/13 1618 12/03/13 2208 12/04/13 0635  GLUCAP 124* 114* 157* 119* 98 123*   Urine Drug Screen: Drugs of Abuse     Component Value Date/Time   LABOPIA POSITIVE* 12/01/2013 0533   LABOPIA NEG 06/01/2011 1115   COCAINSCRNUR NONE DETECTED 12/01/2013 0533   COCAINSCRNUR NEG 06/01/2011 1115   LABBENZ NONE DETECTED 12/01/2013 0533   LABBENZ NEG 06/01/2011 1115   LABBENZ NEG 02/18/2009 2050   AMPHETMU NONE DETECTED 12/01/2013 0533   AMPHETMU NEG 06/01/2011 1115   AMPHETMU NEG 02/18/2009 2050   THCU NONE DETECTED 12/01/2013 0533   THCU NEG 06/01/2011 1115   LABBARB NONE DETECTED 12/01/2013 0533   LABBARB NEG 06/01/2011 1115    Alcohol Level:  Recent Labs Lab 11/30/13 2216  ETH <11   Urinalysis:  Recent Labs Lab 11/30/13 [place]  LABSPEC 1.018  PHURINE 8.0  GLUCOSEU NEGATIVE  HGBUR NEGATIVE  BILIRUBINUR NEGATIVE  KETONESUR NEGATIVE  PROTEINUR 30*  UROBILINOGEN 4.0*  NITRITE NEGATIVE  LEUKOCYTESUR NEGATIVE    Micro Results: Recent Results (from the past 240 hour(s))  Urine culture     Status: None   Collection Time: 11/30/13 11:20 PM  Result Value Ref Range Status   Specimen Description URINE, CLEAN CATCH  Final   Special Requests NONE  Final   Culture  Setup Time   Final    12/01/2013 09:01 Performed at [place] Performed at [facility]   Final   Culture NO GROWTH Performed at [facility]   Final   Report Status 12/02/2013 FINAL  Final  Blood culture (routine x 2)     Status: None (Preliminary result)   Collection Time: 12/01/13 12:05 AM  Result Value Ref Range Status   Specimen Description BLOOD RIGHT ARM  Final   Special  Requests BOTTLES DRAWN AEROBIC AND ANAEROBIC 5CC  Final   Culture  Setup Time   Final    12/01/2013 04:15 Performed at [facility]    Culture   Final           BLOOD CULTURE RECEIVED NO GROWTH TO DATE CULTURE WILL BE HELD FOR 5 DAYS BEFORE ISSUING A FINAL NEGATIVE REPORT Performed at [facility]    Report Status PENDING  Incomplete  Blood culture (routine x 2)     Status: None (Preliminary result)   Collection Time: 12/01/13 12:08 AM  Result Value Ref Range Status   Specimen Description BLOOD RIGHT HAND  Final   Special Requests BOTTLES DRAWN AEROBIC AND ANAEROBIC 5CC  Final   Culture  Setup Time   Final    12/01/2013 04:15 Performed at [facility]    Culture   Final           BLOOD CULTURE RECEIVED NO GROWTH TO DATE CULTURE WILL BE HELD FOR 5 DAYS BEFORE ISSUING A FINAL NEGATIVE REPORT Performed at [facility]    Report Status PENDING  Incomplete  Culture, expectorated sputum-assessment     Status: None   Collection Time: 12/01/13  4:33 AM  Result Value Ref Range Status   Specimen Description SPUTUM  Final   Special Requests NONE  Final   Sputum evaluation   Final    MICROSCOPIC FINDINGS SUGGEST THAT THIS SPECIMEN IS NOT REPRESENTATIVE OF LOWER RESPIRATORY SECRETIONS. PLEASE RECOLLECT. CALLED TO [facility] [phone] 12/01/13 E.GADDY    Report Status 12/01/2013 FINAL  Final   Studies/Results: No results found. Medications: I have reviewed the patient's current medications. Scheduled Meds: . cefTRIAXone (ROCEPHIN)  IV  2 g Intravenous Q12H  . famotidine  10 mg Oral BID  . feeding supplement (GLUCERNA SHAKE)  237 mL Oral BID BM  . fenofibrate  54 mg Oral Daily  . Influenza vac split quadrivalent PF  0.5 mL Intramuscular Tomorrow-1000  . metronidazole  500 mg Intravenous Q6H  . mometasone-formoterol  2 puff Inhalation BID  . multivitamin with minerals  1 tablet Oral Daily  . neomycin-bacitracin-polymyxin   Topical BID  . nicotine  21 mg  Transdermal Q24H  . pantoprazole  40 mg Oral BID  . polyethylene glycol  17 g Oral BID  . vancomycin  1,000 mg Intravenous Q8H   Continuous Infusions: . sodium chloride 20 mL/hr at 12/03/13 0805   PRN Meds:.acetaminophen, albuterol, clonazePAM,  diclofenac sodium, hydrocortisone cream, hydrOXYzine, ipratropium-albuterol, morphine injection Assessment/Plan: Principal Problem:   Brain mass Active Problems:   Dyslipidemia   Anxiety state   HYPERTENSION, BENIGN ESSENTIAL   Peripheral vascular disease   COPD (chronic obstructive pulmonary disease)   Tobacco abuse   Abnormality of gait   Loss of weight   Malnutrition   Pneumonia   Heroin abuse   Protein-calorie malnutrition, severe   Malignancy   Rectal mass   Brain Masses and pulmonary nodules - MRI brain revealed 3 abscesses which neurosx has deferred brain biopsy. - CT abdomen/pelvis and chest revealed possible apple core lesion and malignancy in the rectum, enlarged pretracheal and subcarinal adenopathy 2/2 inflammatory cysts vs metastasis, 2.8cm fluid collection in rt middle lobe concerning for cavitating PNA, multiple nodular densities in RUL PNA vs inflammation, 3.6cm well defined nodule in LUL concerning for metastatic disease. CT f/u recommended.  - TEE ordered 2/2 IVDU, called cardsmaster who placed him on schedule - blood cultures NGTD - Neuro checks - Dr. 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 - flex sigmoid today  Dysphagia- SLP saw patient yesterday and placed pt on NPO due to decreased airway protection at baseline -wet vocal quality and aspiration of secretions. - barium swallow study today for neurogenic dysphagia vs esophageal dysfunction  Metastatic disease with cavitary Pneumonia: Was treated for PNA in 09/2013 with azithromycin, then had infiltrative finding on CXR in 10/2013. Has persistent infiltrate in  RML suggestive of pneumonia today. Has remote history of TB. - CT chest results noted above, likely pt has metastatic disease, will continue to work up - abx as noted above  - Sputum culture needs to be recollected.   Lower Extremity pain now w/ new weakness: Dopplers negative for DVT. Complaining of back pain now with inability to move legs. - MRI of cervical, thoracic, and lumbar spine w/ and w/o contrast ordered stat, spoke with radiology who believe he will be seen around noon ( 2 patients ahead of him on schedule) - morphine 2mg  q4h scheduled for pain, added toradol IV 30mg  once PRN yesterday which did not help. Will increase morphine to q3h  Suicidal comments- made threat to harm himself with plan of using gun - psych consulted yesterday, called behavioral health today- pt was not on the schedule but scheduler placed him on it today.  Fever: 101.3 deg F on admission, afebrile on admission  - Tylenol for fever and pain - Sputum culture needs to be collected - blood culture  NGTD, urine culture negative  Polysubstance Abuse: 50 pack year smoker who started using IV heroin about a year ago when his pain medications were not renewed. States that he does not share needles. Patient admits to heroin use yesterday. He does not appear to be in withdrawal (which peaks 30-56 hours after last use of heroin); he has mydriasis, but normal VS, no piloerection, no diaphoresis, no vomiting/diarrhea. - nicotine patch - Morphine 2mg  q4h for withdrawal, can increase to q2h if needed. Currently no in withdrawal - concern for wife possibly giving pt drugs, will consider camera room when available (per nurse- possibility of transfer to camera room today ); wife not present in the room this morning, sons state it is a possibility she may slip drugs. - palliative care consulted for possible administration of methodone as pt will require extensive long term tx with IV abx.   COPD:  Progressive emphysema; smoking  history of 50 pack years. Had recent PFTs. Uses combivent and advair. - Continue home medications  Anxiety: May be contributing to his dyspnea, as per prior clinic notes. Has taken klonopin in the past; no longer written for klonopin by IMTS. Obtained new rx from a psychiatrist affiliated with his therapist. - Continue klonopin 0.5mg  BID   DMII: Stable and well-controlled on metformin. Hold metformin can start sliding scale sensitive if CBGs elevated - continue CBG checks q4  Essential Hypertension: SBP ranged 140-168 overnight, could be 2/2 to pain. Will continue to monitor. Did not see any BP home meds listed.  DVT Ppx:  - Lovenox  Diet:  - Carb mod  Dispo: Disposition is deferred at this time, awaiting improvement of current medical problems. Anticipated discharge in approximately 5 day(s).   The patient does have a current PCP (Kelby Aline, MD) and does need an [facility] hospital follow-up appointment after discharge.  The patient does have transportation limitations that hinder transportation to clinic appointments.  .Services Needed at time of discharge: Y = Yes, Blank = No PT:   OT: SNF  RN:   Equipment:   Other:     LOS: 4 days   Julious Oka, MD 12/04/2013, 9:53 AM

## 2013-12-04 NOTE — Progress Notes (Signed)
SLP Cancellation Note  Patient Details Name: [name] MRN: [id number] DOB: 1960/06/19   Cancelled treatment:       Reason Eval/Treat Not Completed: Patient at procedure or test/unavailable  Gabriel Rainwater [place], [place] [phone]  Gabriel Rainwater Meryl 12/04/2013, 2:36 PM

## 2013-12-04 NOTE — Progress Notes (Signed)
Chaplain referred to pt from pt nurse. Pt sleeping upon arrival and later woke up. Pt sons, [name] and Joeph, spoke with chaplain about father's condition stating that their father uses suicide threats as a means for attention. Additionally, they believe their father's condition and addiction are where they are due to influences from his girlfriend, [name], but they do know he is an adult and can make his own decisions. Pt sons are very happy at a sitter being present so someone can watch when [name] is present. While present pt did mumble that he would like to hang himself. Youngest son, [name], told his father that was not going to happen. Pt asked for prayer from chaplain. Chaplain will continue to follow. Page chaplain as needed.  Marilena Trevathan, Barbette Hair, Chaplain 12/04/2013 10:36 AM

## 2013-12-04 NOTE — Progress Notes (Signed)
    [facility] for Infectious Disease    Date of Admission:  11/30/2013   Total days of antibiotics 3        Day 3 vanco        Day 3 ceftriaxone        Day 3 metronidazole   ID: [name] is a 53 y.o. male with presumed brain abscesses   Principal Problem:   Brain mass Active Problems:   Dyslipidemia   Anxiety state   HYPERTENSION, BENIGN ESSENTIAL   Peripheral vascular disease   COPD (chronic obstructive pulmonary disease)   Tobacco abuse   Abnormality of gait   Loss of weight   Malnutrition   Pneumonia   Heroin abuse   Protein-calorie malnutrition, severe   Malignancy   Rectal mass    Subjective: afebrile  Medications:  . cefTRIAXone (ROCEPHIN)  IV  2 g Intravenous Q12H  . famotidine  10 mg Oral BID  . feeding supplement (GLUCERNA SHAKE)  237 mL Oral BID BM  . fenofibrate  54 mg Oral Daily  . Influenza vac split quadrivalent PF  0.5 mL Intramuscular Tomorrow-1000  . metronidazole  500 mg Intravenous Q6H  . mometasone-formoterol  2 puff Inhalation BID  . multivitamin with minerals  1 tablet Oral Daily  . neomycin-bacitracin-polymyxin   Topical BID  . nicotine  21 mg Transdermal Q24H  . pantoprazole  40 mg Oral BID  . polyethylene glycol  17 g Oral BID  . vancomycin  1,000 mg Intravenous Q8H    Objective: Vital signs in last 24 hours: Temp:  [97.6 F (36.4 C)-99 F (37.2 C)] 97.7 F (36.5 C) (11/23 0628) Pulse Rate:  [90-112] 103 (11/23 0628) Resp:  [18-22] 18 (11/23 0628) BP: (129-172)/(71-85) 149/85 mmHg (11/23 0628) SpO2:  [94 %-100 %] 96 % (11/23 0908) Weight:  [137 lb 2 oz (62.2 kg)] 137 lb 2 oz (62.2 kg) (11/23 0458) Patient away from room   Lab Results  Recent Labs  12/02/13 0624  WBC 10.6*  HGB 11.3*  HCT 35.1*  NA 134*  K 4.1  CL 98  CO2 21  BUN 9  CREATININE 0.67   Liver Panel No results for input(s): PROT, ALBUMIN, AST, ALT, ALKPHOS, BILITOT, BILIDIR, IBILI in the last 72 hours. Sedimentation Rate No results for  input(s): ESRSEDRATE in the last 72 hours. C-Reactive Protein No results for input(s): CRP in the last 72 hours.  Microbiology:  Studies/Results: No results found.   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 Neurosurgery declined doing brain biopsy to help with identification of pathogen.   - attempting to find origin of infection. Patient to get TEE, agree with getting mri of spine to look for other nidus of infection  Opiate dependence = please get palliative care consultation to help transition patient to methadone. Patient will need to have 6 wk at minimum of IV antibiotics. His opiate depedence will need to treat/substitute heroin for methadone in the meantime.     Baxter Flattery [facility] Cell: [phone] Pager: [phone]  12/04/2013, 10:27 AM

## 2013-12-04 NOTE — Anesthesia Preprocedure Evaluation (Addendum)
Anesthesia Evaluation  Patient identified by MRN, date of birth, ID band Patient awake    Reviewed: Allergy & Precautions, H&P , NPO status , Patient's Chart, lab work & pertinent test results  Airway Mallampati: II  TM Distance: >3 FB Neck ROM: Full    Dental   Pulmonary shortness of breath, sleep apnea , COPDCurrent Smoker,          Cardiovascular hypertension, Pt. on medications + Peripheral Vascular Disease     Neuro/Psych    GI/Hepatic GERD-  Medicated and Controlled,(+) Hepatitis -, C  Endo/Other  diabetes, Type 2, Insulin Dependent  Renal/GU      Musculoskeletal   Abdominal   Peds  Hematology   Anesthesia Other Findings   Reproductive/Obstetrics                            Anesthesia Physical Anesthesia Plan  ASA: III  Anesthesia Plan: MAC   Post-op Pain Management:    Induction: Intravenous  Airway Management Planned: Mask  Additional Equipment:   Intra-op Plan:   Post-operative Plan:   Informed Consent: I have reviewed the patients History and Physical, chart, labs and discussed the procedure including the risks, benefits and alternatives for the proposed anesthesia with the patient or authorized representative who has indicated his/her understanding and acceptance.   Dental advisory given and History available from chart only  Plan Discussed with: CRNA and Surgeon  Anesthesia Plan Comments:        Anesthesia Quick Evaluation

## 2013-12-04 NOTE — Op Note (Signed)
[facility] [place], [address]   FLEXIBLE SIGMOIDOSCOPY PROCEDURE REPORT  PATIENT: [name], [name]  MR#: [id number] BIRTHDATE: 28-Sep-1960 , 31  yrs. old GENDER: male ENDOSCOPIST: Inda Castle, MD REFERRED BY: PROCEDURE DATE:  12/04/2013 PROCEDURE:   Sigmoidoscopy, diagnostic ASA CLASS:   Class III INDICATIONS:an abnormal CT. suggesting rectal mass MEDICATIONS: Monitored anesthesia care  DESCRIPTION OF PROCEDURE:   After the risks benefits and alternatives of the procedure were thoroughly explained, informed consent was obtained.  Digital exam revealed no abnormalities of the rectum. The     endoscope was introduced through the anus  and advanced to the descending colon , The exam was Without limitations.    The quality of the prep was    .  The instrument was then slowly withdrawn as the mucosa was fully examined.         COLON FINDINGS: Internal hemorrhoids were found.   The colon mucosa was otherwise normal.    Retroflexed views revealed no abnormalities.    The scope was then withdrawn from the patient and the procedure terminated.  COMPLICATIONS: There were no immediate complications.  ENDOSCOPIC IMPRESSION: 1.   Internal hemorrhoids 2.   The colon mucosa was otherwise normal  RECOMMENDATIONS: no further GI workup  REPEAT EXAM:  eSigned:  Inda Castle, MD 12/04/2013 2:25 PM   CC:

## 2013-12-04 NOTE — H&P (View-Only) (Signed)
    Progress Note   Subjective  Thirsty. States he has lost function of hands and legs.    Objective   Vital signs in last 24 hours: Temp:  [97.9 F (36.6 C)-101.7 F (38.7 C)] 98.9 F (37.2 C) (11/22 0949) Pulse Rate:  [93-123] 94 (11/22 0949) Resp:  [21-28] 22 (11/22 0949) BP: (140-168)/(78-89) 159/81 mmHg (11/22 0949) SpO2:  [97 %-99 %] 99 % (11/22 0949) Weight:  [136 lb 11 oz (62 kg)] 136 lb 11 oz (62 kg) (11/22 0500) Last BM Date:  (pt stated cannot remember LBM) General:    Thin white male in NAD Abdomen:  Soft, nontender and nondistended. Normal bowel sounds. Neurologic:  Alert and oriented.  Psych:  Cooperative.    Lab Results:  Recent Labs  11/30/13 2216 12/01/13 0414 12/02/13 0624  WBC 10.3 10.4 10.6*  HGB 12.5* 10.5* 11.3*  HCT 38.3* 32.8* 35.1*  PLT 434* 375 326   BMET  Recent Labs  11/30/13 2216 12/01/13 0414 12/02/13 0624  NA 134* 139 134*  K 4.2 3.6* 4.1  CL 95* 102 98  CO2 24 25 21   GLUCOSE 109* 97 106*  BUN 10 11 9   CREATININE 0.65 0.68 0.67  CALCIUM 9.1 8.5 8.5   LFT  Recent Labs  12/01/13 0414  PROT 7.6  ALBUMIN 2.4*  AST 60*  ALT 34  ALKPHOS 73  BILITOT 0.4     Assessment / Plan:   1. 53 yo male with constipation and rectal lesion on CTscan, rule out malignancy. Plan is for sigmoidoscopy tomorrow. Will order 0700 enema X2.  2. Brain lesions, abscess vrs metastatic lesions. Hx IVDU. For TEE tomorrow. On 3 antibiotics, if no response Neurosurgery recommends biopsy. Of note, he complains of upper and lower extremity weakness. MRI spine was ordered.  3. Abnormal CTchest - PNA and pulmonary nodules , rule out metastatic disease.   4. Coughing with PO intake. NPO now, SLP pending.   5.  Hx of IVDU. Also, urine tox + cocaine / Marijuana / opiates     LOS: 3 days   Tye Savoy  12/03/2013, 12:50 PM  Attending MD note:   I have taken a history,  and reviewed the chart. I agree with the Advanced Practitioner's impression  and recommendations. Limited prep for flex sigmoidoscopy to clarify apple core narrowing in the rectum on CT sca of the abdomen last week.  Melburn Popper [facility] Pager # [phone]

## 2013-12-04 NOTE — Interval H&P Note (Signed)
History and Physical Interval Note:  12/04/2013 2:11 PM  [name]  has presented today for surgery, with the diagnosis of rectal mass  The various methods of treatment have been discussed with the patient and family. After consideration of risks, benefits and other options for treatment, the patient has consented to  Procedure(s): FLEXIBLE SIGMOIDOSCOPY (N/A) as a surgical intervention .  The patient's history has been reviewed, patient examined, no change in status, stable for surgery.  I have reviewed the patient's chart and labs.  Questions were answered to the patient's satisfaction.    The recent H&P (dated **12/03/13*) was reviewed, the patient was examined and there is no change in the patients condition since that H&P was completed.   Erskine Emery  12/04/2013, 2:11 PM    Erskine Emery

## 2013-12-04 NOTE — Transfer of Care (Signed)
Immediate Anesthesia Transfer of Care Note  Patient: [name]  Procedure(s) Performed: Procedure(s): FLEXIBLE SIGMOIDOSCOPY (N/A)  Patient Location: Endoscopy Unit  Anesthesia Type:MAC  Level of Consciousness: awake, alert , oriented and patient cooperative  Airway & Oxygen Therapy: Patient Spontanous Breathing and Patient connected to nasal cannula oxygen  Post-op Assessment: Report given to PACU RN, Post -op Vital signs reviewed and stable and Patient moving all extremities X 4  Post vital signs: Reviewed and stable  Complications: No apparent anesthesia complications

## 2013-12-04 NOTE — Progress Notes (Signed)
Sigmoidoscopy did not demonstrate any abnormalities in the rectum, sigmoid or descending colon. No further GI workup

## 2013-12-05 ENCOUNTER — Encounter (HOSPITAL_COMMUNITY): Payer: Self-pay | Admitting: Gastroenterology

## 2013-12-05 ENCOUNTER — Inpatient Hospital Stay (HOSPITAL_COMMUNITY): Payer: Medicaid Other

## 2013-12-05 ENCOUNTER — Inpatient Hospital Stay (HOSPITAL_COMMUNITY): Payer: Medicaid Other | Admitting: Anesthesiology

## 2013-12-05 ENCOUNTER — Encounter (HOSPITAL_COMMUNITY)
Admission: EM | Disposition: A | Discharge status: Skilled Nursing Facility | Payer: Self-pay | Source: Home / Self Care | Attending: Internal Medicine

## 2013-12-05 DIAGNOSIS — E871 Hypo-osmolality and hyponatremia: Secondary | ICD-10-CM

## 2013-12-05 HISTORY — PX: PR DURAL GRAFT REPAIR,SPINE DEFECT: 63710

## 2013-12-05 LAB — GLUCOSE, CAPILLARY
GLUCOSE-CAPILLARY: 189 mg/dL — AB (ref 70–99)
Glucose-Capillary: 132 mg/dL — ABNORMAL HIGH (ref 70–99)
Glucose-Capillary: 135 mg/dL — ABNORMAL HIGH (ref 70–99)
Glucose-Capillary: 129 mg/dL — ABNORMAL HIGH (ref 70–99)
Glucose-Capillary: 156 mg/dL — ABNORMAL HIGH (ref 70–99)

## 2013-12-05 LAB — CBC WITH DIFFERENTIAL/PLATELET
BASOS ABS: 0 10*3/uL (ref 0.0–0.1)
Basophils Relative: 0 % (ref 0–1)
EOS PCT: 0 % (ref 0–5)
Eosinophils Absolute: 0 10*3/uL (ref 0.0–0.7)
HCT: 42.4 % (ref 39.0–52.0)
Hemoglobin: 14.1 g/dL (ref 13.0–17.0)
Lymphocytes Relative: 12 % (ref 12–46)
Lymphs Abs: 1.2 10*3/uL (ref 0.7–4.0)
MCH: 27.1 pg (ref 26.0–34.0)
MCHC: 33.3 g/dL (ref 30.0–36.0)
MCV: 81.4 fL (ref 78.0–100.0)
Monocytes Absolute: 0.3 10*3/uL (ref 0.1–1.0)
Monocytes Relative: 3 % (ref 3–12)
Neutro Abs: 8.9 10*3/uL — ABNORMAL HIGH (ref 1.7–7.7)
Neutrophils Relative %: 85 % — ABNORMAL HIGH (ref 43–77)
PLATELETS: 456 10*3/uL — AB (ref 150–400)
RBC: 5.21 MIL/uL (ref 4.22–5.81)
RDW: 15.3 % (ref 11.5–15.5)
WBC: 10.5 10*3/uL (ref 4.0–10.5)

## 2013-12-05 LAB — BASIC METABOLIC PANEL
Anion gap: 17 — ABNORMAL HIGH (ref 5–15)
Anion gap: 16 — ABNORMAL HIGH (ref 5–15)
BUN: 20 mg/dL (ref 6–23)
BUN: 21 mg/dL (ref 6–23)
CALCIUM: 9.3 mg/dL (ref 8.4–10.5)
CO2: 17 mEq/L — ABNORMAL LOW (ref 19–32)
CO2: 19 mEq/L (ref 19–32)
Calcium: 9.3 mg/dL (ref 8.4–10.5)
Chloride: 94 mEq/L — ABNORMAL LOW (ref 96–112)
Chloride: 97 mEq/L (ref 96–112)
Creatinine, Ser: 0.58 mg/dL (ref 0.50–1.35)
Creatinine, Ser: 0.56 mg/dL (ref 0.50–1.35)
GFR calc Af Amer: >90 mL/min (ref >90)
GFR calc non Af Amer: >90 mL/min (ref >90)
Glucose, Bld: 137 mg/dL — ABNORMAL HIGH (ref 70–99)
Glucose, Bld: 150 mg/dL — ABNORMAL HIGH (ref 70–99)
Potassium: 4.7 mEq/L (ref 3.7–5.3)
Potassium: 4.8 mEq/L (ref 3.7–5.3)
SODIUM: 128 meq/L — AB (ref 137–147)
Sodium: 132 mEq/L — ABNORMAL LOW (ref 137–147)

## 2013-12-05 LAB — TROPONIN I
Troponin I: <0.3 ng/mL (ref <0.30)
Troponin I: <0.3 ng/mL (ref <0.30)

## 2013-12-05 LAB — OSMOLALITY: Osmolality: 289 mOsm/kg (ref 275–300)

## 2013-12-05 LAB — SODIUM, URINE, RANDOM: Sodium, Ur: 56 mEq/L

## 2013-12-05 SURGERY — FRAMELESS STEREOTACTIC BIOPSY
Anesthesia: General | Site: Head | Laterality: Bilateral

## 2013-12-05 MED ORDER — ROCURONIUM BROMIDE 100 MG/10ML IV SOLN
INTRAVENOUS | Status: DC | PRN
  Administered 2013-12-05: 30 mg via INTRAVENOUS
  Administered 2013-12-05: 10 mg via INTRAVENOUS

## 2013-12-05 MED ORDER — ONDANSETRON HCL 4 MG/2ML IJ SOLN
INTRAMUSCULAR | Status: DC | PRN
  Administered 2013-12-05: 4 mg via INTRAVENOUS

## 2013-12-05 MED ORDER — BUPIVACAINE HCL (PF) 0.5 % IJ SOLN
INTRAMUSCULAR | Status: DC | PRN
  Administered 2013-12-05: 1.5 mL

## 2013-12-05 MED ORDER — SODIUM CHLORIDE 0.9 % IV SOLN
INTRAVENOUS | Status: DC | PRN
  Administered 2013-12-05 (×2): via INTRAVENOUS

## 2013-12-05 MED ORDER — MIDAZOLAM HCL 2 MG/2ML IJ SOLN
INTRAMUSCULAR | Status: AC
  Filled 2013-12-05: qty 2

## 2013-12-05 MED ORDER — FENTANYL CITRATE 0.05 MG/ML IJ SOLN
INTRAMUSCULAR | Status: DC | PRN
  Administered 2013-12-05 (×2): 50 ug via INTRAVENOUS

## 2013-12-05 MED ORDER — PROPOFOL 10 MG/ML IV BOLUS
INTRAVENOUS | Status: DC | PRN
  Administered 2013-12-05: 90 mg via INTRAVENOUS
  Administered 2013-12-05: 40 mg via INTRAVENOUS
  Administered 2013-12-05: 20 mg via INTRAVENOUS

## 2013-12-05 MED ORDER — GLYCOPYRROLATE 0.2 MG/ML IJ SOLN
INTRAMUSCULAR | Status: DC | PRN
  Administered 2013-12-05: 0.6 mg via INTRAVENOUS

## 2013-12-05 MED ORDER — PROMETHAZINE HCL 25 MG/ML IJ SOLN
12.5000 mg | Freq: Once | INTRAMUSCULAR | Status: AC
  Administered 2013-12-05: 12.5 mg via INTRAVENOUS
  Filled 2013-12-05: qty 1

## 2013-12-05 MED ORDER — ONDANSETRON HCL 4 MG/2ML IJ SOLN
INTRAMUSCULAR | Status: AC
Start: 2013-12-05 — End: 2013-12-05
  Filled 2013-12-05: qty 2

## 2013-12-05 MED ORDER — FENTANYL CITRATE 0.05 MG/ML IJ SOLN
INTRAMUSCULAR | Status: AC
Start: 2013-12-05 — End: 2013-12-05
  Filled 2013-12-05: qty 5

## 2013-12-05 MED ORDER — DEXTROSE 5 % IV SOLN
5.0000 mg/kg | INTRAVENOUS | Status: DC
  Administered 2013-12-05: 310 mg via INTRAVENOUS
  Filled 2013-12-05 (×3): qty 310

## 2013-12-05 MED ORDER — HYDROCORTISONE ACE-PRAMOXINE 2.5-1 % RE CREA
TOPICAL_CREAM | Freq: Once | RECTAL | Status: DC
  Filled 2013-12-05: qty 30

## 2013-12-05 MED ORDER — IOHEXOL 300 MG/ML  SOLN
80.0000 mL | Freq: Once | INTRAMUSCULAR | Status: AC | PRN
  Administered 2013-12-05: 80 mL via INTRAVENOUS

## 2013-12-05 MED ORDER — HEMOSTATIC AGENTS (NO CHARGE) OPTIME
TOPICAL | Status: DC | PRN
  Administered 2013-12-05: 1 via TOPICAL

## 2013-12-05 MED ORDER — NEOSTIGMINE METHYLSULFATE 10 MG/10ML IV SOLN
INTRAVENOUS | Status: DC | PRN
  Administered 2013-12-05: 3 mg via INTRAVENOUS

## 2013-12-05 MED ORDER — METRONIDAZOLE IN NACL 5-0.79 MG/ML-% IV SOLN
500.0000 mg | Freq: Four times a day (QID) | INTRAVENOUS | Status: DC
  Administered 2013-12-06 – 2013-12-17 (×42): 500 mg via INTRAVENOUS
  Filled 2013-12-05 (×49): qty 100

## 2013-12-05 MED ORDER — EPHEDRINE SULFATE 50 MG/ML IJ SOLN
INTRAMUSCULAR | Status: DC | PRN
  Administered 2013-12-05 (×2): 5 mg via INTRAVENOUS

## 2013-12-05 MED ORDER — PHENYLEPHRINE HCL 10 MG/ML IJ SOLN
10.0000 mg | INTRAVENOUS | Status: DC | PRN
  Administered 2013-12-05: 50 ug/min via INTRAVENOUS

## 2013-12-05 MED ORDER — PROPOFOL 10 MG/ML IV BOLUS
INTRAVENOUS | Status: AC
  Filled 2013-12-05: qty 20

## 2013-12-05 MED ORDER — HYDROCORTISONE 2.5 % RE CREA
TOPICAL_CREAM | Freq: Once | RECTAL | Status: AC
  Administered 2013-12-05: 04:00:00 via RECTAL
  Filled 2013-12-05: qty 28.35

## 2013-12-05 MED ORDER — THROMBIN 5000 UNITS EX SOLR
CUTANEOUS | Status: DC | PRN
  Administered 2013-12-05: 5000 [IU] via TOPICAL

## 2013-12-05 MED ORDER — PHENYLEPHRINE 40 MCG/ML (10ML) SYRINGE FOR IV PUSH (FOR BLOOD PRESSURE SUPPORT)
PREFILLED_SYRINGE | INTRAVENOUS | Status: AC
  Filled 2013-12-05: qty 10

## 2013-12-05 MED ORDER — LEVETIRACETAM IN NACL 500 MG/100ML IV SOLN
500.0000 mg | INTRAVENOUS | Status: DC
  Filled 2013-12-05: qty 100

## 2013-12-05 MED ORDER — ARTIFICIAL TEARS OP OINT
TOPICAL_OINTMENT | OPHTHALMIC | Status: DC | PRN
  Administered 2013-12-05: 1 via OPHTHALMIC

## 2013-12-05 MED ORDER — NEOSTIGMINE METHYLSULFATE 10 MG/10ML IV SOLN
INTRAVENOUS | Status: AC
  Filled 2013-12-05: qty 2

## 2013-12-05 MED ORDER — PHENYLEPHRINE HCL 10 MG/ML IJ SOLN
INTRAMUSCULAR | Status: DC | PRN
  Administered 2013-12-05 (×2): 80 ug via INTRAVENOUS

## 2013-12-05 MED ORDER — LIDOCAINE-EPINEPHRINE 1 %-1:100000 IJ SOLN
INTRAMUSCULAR | Status: DC | PRN
  Administered 2013-12-05: 1.5 mL via INTRADERMAL

## 2013-12-05 MED ORDER — 0.9 % SODIUM CHLORIDE (POUR BTL) OPTIME
TOPICAL | Status: DC | PRN
  Administered 2013-12-05: 1000 mL

## 2013-12-05 SURGICAL SUPPLY — 56 items
BANDAGE ADH SHEER 1  50/CT (GAUZE/BANDAGES/DRESSINGS) IMPLANT
BLADE CLIPPER SURG (BLADE) IMPLANT
BLADE SURG 10 STRL SS (BLADE) ×3 IMPLANT
BLADE SURG 15 STRL LF DISP TIS (BLADE) ×1 IMPLANT
BLADE SURG 15 STRL SS (BLADE) ×3
CANISTER SUCT 3000ML (MISCELLANEOUS) ×3 IMPLANT
CONT SPEC 4OZ CLIKSEAL STRL BL (MISCELLANEOUS) ×3 IMPLANT
CORDS BIPOLAR (ELECTRODE) ×3 IMPLANT
COVER MAYO STAND STRL (DRAPES) ×3 IMPLANT
DRAPE INCISE IOBAN 66X45 STRL (DRAPES) ×3 IMPLANT
DRAPE POUCH INSTRU U-SHP 10X18 (DRAPES) ×3 IMPLANT
DRSG OPSITE 4X5.5 SM (GAUZE/BANDAGES/DRESSINGS) IMPLANT
DRSG OPSITE POSTOP 3X4 (GAUZE/BANDAGES/DRESSINGS) ×4 IMPLANT
DRSG TELFA 3X8 NADH (GAUZE/BANDAGES/DRESSINGS) ×3 IMPLANT
DURAPREP 26ML APPLICATOR (WOUND CARE) IMPLANT
ELECT CAUTERY BLADE 6.4 (BLADE) ×3 IMPLANT
ELECT REM PT RETURN 9FT ADLT (ELECTROSURGICAL) ×3
ELECTRODE REM PT RTRN 9FT ADLT (ELECTROSURGICAL) ×1 IMPLANT
GAUZE SPONGE 4X4 16PLY XRAY LF (GAUZE/BANDAGES/DRESSINGS) ×3 IMPLANT
GLOVE BIO SURGEON STRL SZ7 (GLOVE) ×6 IMPLANT
GLOVE BIO SURGEON STRL SZ8 (GLOVE) ×2 IMPLANT
GLOVE BIOGEL PI IND STRL 8.5 (GLOVE) ×1 IMPLANT
GLOVE BIOGEL PI INDICATOR 8.5 (GLOVE) ×4
GLOVE ECLIPSE 8.5 STRL (GLOVE) ×3 IMPLANT
GLOVE EXAM NITRILE LRG STRL (GLOVE) IMPLANT
GLOVE EXAM NITRILE MD LF STRL (GLOVE) IMPLANT
GLOVE EXAM NITRILE XL STR (GLOVE) IMPLANT
GLOVE EXAM NITRILE XS STR PU (GLOVE) IMPLANT
GLOVE INDICATOR 7.5 STRL GRN (GLOVE) ×2 IMPLANT
GOWN STRL REUS W/ TWL LRG LVL3 (GOWN DISPOSABLE) IMPLANT
GOWN STRL REUS W/ TWL XL LVL3 (GOWN DISPOSABLE) ×1 IMPLANT
GOWN STRL REUS W/TWL 2XL LVL3 (GOWN DISPOSABLE) ×3 IMPLANT
GOWN STRL REUS W/TWL LRG LVL3 (GOWN DISPOSABLE) ×6
GOWN STRL REUS W/TWL XL LVL3 (GOWN DISPOSABLE) ×3
KIT BASIN OR (CUSTOM PROCEDURE TRAY) ×3 IMPLANT
KIT ROOM TURNOVER OR (KITS) ×3 IMPLANT
NDL HYPO 25X1 1.5 SAFETY (NEEDLE) ×1 IMPLANT
NEEDLE HYPO 25X1 1.5 SAFETY (NEEDLE) ×3 IMPLANT
NS IRRIG 1000ML POUR BTL (IV SOLUTION) ×3 IMPLANT
PACK EENT II TURBAN DRAPE (CUSTOM PROCEDURE TRAY) ×3 IMPLANT
PAD ARMBOARD 7.5X6 YLW CONV (MISCELLANEOUS) ×9 IMPLANT
PAD DRESSING TELFA 3X8 NADH (GAUZE/BANDAGES/DRESSINGS) ×1 IMPLANT
PATTIES SURGICAL .5 X.5 (GAUZE/BANDAGES/DRESSINGS) ×3 IMPLANT
PENCIL BUTTON HOLSTER BLD 10FT (ELECTRODE) ×3 IMPLANT
PERFORATOR CRAN ADLT SML 11X7 (MISCELLANEOUS) ×3 IMPLANT
STAPLER SKIN PROX WIDE 3.9 (STAPLE) ×3 IMPLANT
SUT BONE WAX W31G (SUTURE) ×3 IMPLANT
SUT VIC AB 2-0 CP2 18 (SUTURE) ×3 IMPLANT
SYR 5ML LUER SLIP (SYRINGE) ×6 IMPLANT
SYR BULB 3OZ (MISCELLANEOUS) ×3 IMPLANT
SYR CONTROL 10ML LL (SYRINGE) ×3 IMPLANT
TOWEL OR 17X24 6PK STRL BLUE (TOWEL DISPOSABLE) ×3 IMPLANT
TOWEL OR 17X26 10 PK STRL BLUE (TOWEL DISPOSABLE) ×3 IMPLANT
TUBE CONNECTING 12'X1/4 (SUCTIONS) ×1
TUBE CONNECTING 12X1/4 (SUCTIONS) ×2 IMPLANT
WATER STERILE IRR 1000ML POUR (IV SOLUTION) ×3 IMPLANT

## 2013-12-05 NOTE — Progress Notes (Signed)
SLP Cancellation Note  Patient Details Name: DRAYCEN LEICHTER MRN: [id number] DOB: Aug 19, 1960   Cancelled treatment:       Reason Eval/Treat Not Completed: Patient at procedure or test/unavailable. Per chart pt to have procedure today and is NPO.    Asiya Cutbirth, Katherene Ponto 12/05/2013, 2:36 PM

## 2013-12-05 NOTE — Progress Notes (Addendum)
[facility]    Date of Admission:  11/30/2013   Total days of antibiotics 4        Day 4 vanco        Day 4 ceftriaxone        Day 4 metronidazole   ID: NYLEN CREQUE is a 53 y.o. male with brain abscesses with worsening neurologic function in the last 24-48hr Principal Problem:   Brain mass Active Problems:   Dyslipidemia   Anxiety state   HYPERTENSION, BENIGN ESSENTIAL   Peripheral vascular disease   COPD (chronic obstructive pulmonary disease)   Tobacco abuse   Abnormality of gait   Loss of weight   Malnutrition   Pneumonia   Heroin abuse   Protein-calorie malnutrition, severe   Malignancy   Rectal mass   Abnormal CT scan, pelvis    Subjective: Afebrile. Underwent NCHCT showing worsening size of abscess and brain edema. Had MRI C-T-L spine that did not show metastatic lesions. Flex sig ruled out rectal lesion thought to be seen on abd CT. This morning the patient is sedated, has his 2 sons as his bedside  Patient seen by Dr. Ellene Route from [place] this morning who taking him to the OR today for surgical management of brain abscesses  Medications:  . amphotericin  B  Liposome (AMBISOME) ADULT IV  5 mg/kg Intravenous Q24H  . cefTRIAXone (ROCEPHIN)  IV  2 g Intravenous Q12H  . dexamethasone  4 mg Intravenous 4 times per day  . famotidine  10 mg Oral BID  . feeding supplement (GLUCERNA SHAKE)  237 mL Oral BID BM  . fenofibrate  54 mg Oral Daily  . Influenza vac split quadrivalent PF  0.5 mL Intramuscular Tomorrow-1000  . metronidazole  500 mg Intravenous Q6H  . mometasone-formoterol  2 puff Inhalation BID  . multivitamin with minerals  1 tablet Oral Daily  . neomycin-bacitracin-polymyxin   Topical BID  . nicotine  21 mg Transdermal Q24H  . pantoprazole  40 mg Oral BID  . polyethylene glycol  17 g Oral BID  . vancomycin  1,000 mg Intravenous Q8H    Objective: Vital signs in last 24 hours: Temp:  [97.3 F (36.3 C)-98.8 F (37.1 C)] 97.7  F (36.5 C) (11/24 0916) Pulse Rate:  [84-108] 84 (11/24 0916) Resp:  [18-28] 20 (11/24 0916) BP: (117-154)/(60-87) 141/85 mmHg (11/24 0916) SpO2:  [94 %-98 %] 98 % (11/24 0916) Physical Exam  Constitutional: He is oriented to person, place, sedated. Appears weak, emaciated chronically ill. Soft spoken with gurgled speech.No distress.  HENT: gaunt, bitemporal wasting, dry oral pharynx when he sticks out his tongue in following direction. Tongue is midline, no fasciculations Mouth/Throat: Oropharynx is clear. No oropharyngeal exudate.  Cardiovascular: Normal rate, regular rhythm and normal heart sounds. Exam reveals no gallop and no friction rub.  No murmur heard.  Pulmonary/Chest: Effort normal and breath sounds normal. No respiratory distress. He has no wheezes.  Abdominal: Soft. Bowel sounds are decreased. He exhibits no distension. There is no tenderness.  Lymphadenopathy:  He has no cervical adenopathy.  Neurological: has 3/5 grip strength in the right hand, 0/5 in the left hand. Unable to lift arms above head bilaterally Skin: Skin is warm and dry. No rash noted. No erythema.  Psychiatric: sedated   Lab Results  Recent Labs  12/05/13 0605  WBC 10.5  HGB 14.1  HCT 42.4  NA 128*  K 4.7  CL 94*  CO2 17*  BUN 20  CREATININE 0.58    Microbiology:  Studies/Results: Ct Head Wo Contrast  12/04/2013   CLINICAL DATA:  Altered mental status.  Multiple brain lesions.  EXAM: CT HEAD WITHOUT CONTRAST  TECHNIQUE: Contiguous axial images were obtained from the base of the skull through the vertex without intravenous contrast.  COMPARISON:  MRI dated 12/01/2013 and is CT scan dated 11/30/2013  FINDINGS: There has been rapidly progression in the apparent size of brain lesions in the right basal ganglia, posterior left frontal lobe, and right frontal centrum semiovale. There is increased edema around each lesion with increased compression of frontal horn of the right lateral ventricle  with new midline shift of 3.7 mm.  IMPRESSION: Rapid progression in the size of the multiple brain lesions with due right to left midline shift and increased edema surrounding the lesions. The possibility of progressive brain abscess should be considered particularly in view of the rapid progression.   Electronically Signed   By: Rozetta Nunnery M.D.   On: 12/04/2013 18:51   Mr Cervical Spine W Wo Contrast  12/04/2013   CLINICAL DATA:  History of stroke, MRSA infection and intravenous drug use presents with fever and leg pain, multiple falls. Diagnosed with intracranial lesions on December 01, 2013 which may reflect abscess or metastatic disease.  EXAM: MRI TOTAL SPINE WITHOUT AND WITH CONTRAST  TECHNIQUE: Multisequence MR imaging of the spine from the cervical spine to the sacrum was performed prior to and following IV contrast administration for evaluation of spinal metastatic disease.  CONTRAST:  61mL MULTIHANCE GADOBENATE DIMEGLUMINE 529 MG/ML IV SOLN  COMPARISON:  CT of the chest, abdomen and pelvis December 09, 2013; MRI of the brain December 01, 2013  FINDINGS: Cervical Findings:  Cervical vertebral bodies and posterior elements appear intact and aligned with maintenance of cervical lordosis. Mild motion degraded examination. Status post C4-5 ACDF, which results in some susceptibility artifact. Mild to moderate C5-6 degenerative disc, with decreased T2 signal within all cervical disc consistent with desiccation. Mild chronic discogenic endplate changes at all cervical levels. No STIR signal abnormality to suggest acute osseous process. Limited post gadolinium sequence due to spinal hardware,  Cervical spinal cord appears normal morphology and signal characteristics from the cervical medullary junction to at least level of T3-4, most caudal well visualized level. Craniocervical junction maintained. 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 No convincing evidence of abnormal cord,  leptomeningeal or epidural enhancement though, limited post gadolinium sagittal sequences and, due to patient's intolerance for further imaging, axial T1 post gadolinium sequence not obtained.  Level by level evaluation:  C2-3: No disc bulge, canal stenosis or neural foraminal narrowing.  C3-4: Uncovertebral hypertrophy and minimal annular bulging without canal stenosis or neural foraminal narrowing.  C4-5 ACDF.  C5-6: 3 mm RIGHT central broad-based disc protrusion. Uncovertebral hypertrophy. Moderate canal stenosis. Severe RIGHT, moderate to severe LEFT neural foraminal narrowing. There is likely a component adjacent segment disease.  C6-7: 2 mm broad-based disc bulge, mild canal stenosis. Minimal neural foraminal narrowing.  C7-T1: No disc bulge, canal stenosis nor neural foraminal narrowing.  Thoracic Findings:  Mild chronic T4 compression fracture. T2 superior endplate Schmorl's node. No acute fracture. No malalignment. Mild T6 70 T7-8 degenerative discs. Nose abnormal bone marrow signal to suggest acute osseous process. No suspicious osseous nor intradiscal enhancement.  Spinal cord appears normal morphology and signal characteristics with conus medullaris which is partially imaged at T12. No abnormal cord, leptomeningeal or upper dural enhancement. Included  prevertebral and paraspinal soft tissues are nonsuspicious.  No significant disc bulge, canal stenosis or neural foraminal narrowing at any thoracic level. Moderate T8-9 facet arthropathy.  Lumbar Findings:  Lumbar vertebral bodies and posterior elements are intact and aligned with maintenance of the lumbar lordosis. Moderate L5-S1 degenerative disc, mild desiccation L3-4 and L4-5. Mild subacute to chronic discogenic endplate changes A1-9 through L4-5. No STIR signal abnormality to suggest acute osseous process. No suspicious osseous nor intradiscal enhancement.  Conus medullaris terminates at T12-L1 and appears normal in morphology and signal  characteristics. Cauda equina is unremarkable. No suspicious cord, leptomeningeal nor epidural enhancement. Included prevertebral and paraspinal soft tissues are nonsuspicious.  Level by level evaluation:  L1-2 and L2-3: No disc bulge, canal stenosis nor neural foraminal narrowing.  L3-4: 2 mm broad-based disc bulge, mild facet arthropathy and ligamentum flavum redundancy. Minimal canal stenosis. Mild bilateral neural foraminal narrowing.  L4-5: 2 mm broad-based disc bulge. Mild to moderate facet arthropathy and ligamentum flavum redundancy. Minimal canal stenosis. Moderate bilateral neural foraminal narrowing.  L5-S1: Conjoined RIGHT L5-S1 nerve root. No significant disc bulge. Moderate facet arthropathy without canal stenosis. Mild RIGHT neural foraminal narrowing.  IMPRESSION: MRI CERVICAL SPINE: Severely limited sagittal post gad. Axial T1 post gadolinium sequences not obtained due to inability to tolerate further imaging.  No suspicious MR findings to suggest metastatic disease or infection with cervical spine.  Status post C4-5 ACDF. 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  Moderate canal stenosis at C5-6, mild at C6-7. Severe RIGHT C5-6 neural foraminal narrowing.  MRI THORACIC SPINE: No MR findings to suggest metastatic disease or infection within the thoracic spine.  Mild thoracic degenerative change without neurocompressive findings.  Remote Mild T4 compression fracture.  MRI LUMBAR SPINE: No MR findings to suggest metastatic disease or infection within the lumbar spine.  Degenerative change of the lumbar spine. Minimal canal stenosis L3-4 and L4-5.  Neural foraminal narrowing L3-4 through L5-S1: Moderate bilaterally at L4-5.  Acute findings discussed with and reconfirmed by Dr.JULIE MALLORY on 12/04/2013 at 11:33 pm.   Electronically Signed   By: Elon Alas   On: 12/04/2013 23:34   Mr Thoracic Spine W Wo Contrast  12/04/2013   CLINICAL  DATA:  History of stroke, MRSA infection and intravenous drug use presents with fever and leg pain, multiple falls. Diagnosed with intracranial lesions on December 01, 2013 which may reflect abscess or metastatic disease.  EXAM: MRI TOTAL SPINE WITHOUT AND WITH CONTRAST  TECHNIQUE: Multisequence MR imaging of the spine from the cervical spine to the sacrum was performed prior to and following IV contrast administration for evaluation of spinal metastatic disease.  CONTRAST:  58mL MULTIHANCE GADOBENATE DIMEGLUMINE 529 MG/ML IV SOLN  COMPARISON:  CT of the chest, abdomen and pelvis December 09, 2013; MRI of the brain December 01, 2013  FINDINGS: Cervical Findings:  Cervical vertebral bodies and posterior elements appear intact and aligned with maintenance of cervical lordosis. Mild motion degraded examination. Status post C4-5 ACDF, which results in some susceptibility artifact. Mild to moderate C5-6 degenerative disc, with decreased T2 signal within all cervical disc consistent with desiccation. Mild chronic discogenic endplate changes at all cervical levels. No STIR signal abnormality to suggest acute osseous process. Limited post gadolinium sequence due to spinal hardware,  Cervical spinal cord appears normal morphology and signal characteristics from the cervical medullary junction to at least level of T3-4, most caudal well visualized level. Craniocervical junction maintained. Mild congenital canal  narrowing on the basis of foreshortened pedicles. No convincing evidence of abnormal cord, leptomeningeal or epidural enhancement though, limited post gadolinium sagittal sequences and, due to patient's intolerance for further imaging, axial T1 post gadolinium sequence not obtained.  Level by level evaluation:  C2-3: No disc bulge, canal stenosis or neural foraminal narrowing.  C3-4: Uncovertebral hypertrophy and minimal annular bulging without canal stenosis or neural foraminal narrowing.  C4-5 ACDF.  C5-6: 3 mm RIGHT  central broad-based disc protrusion. Uncovertebral hypertrophy. Moderate canal stenosis. Severe RIGHT, moderate to severe LEFT neural foraminal narrowing. There is likely a component adjacent segment disease.  C6-7: 2 mm broad-based disc bulge, mild canal stenosis. Minimal neural foraminal narrowing.  C7-T1: No disc bulge, canal stenosis nor neural foraminal narrowing.  Thoracic Findings:  Mild chronic T4 compression fracture. T2 superior endplate Schmorl's node. No acute fracture. No malalignment. Mild T6 70 T7-8 degenerative discs. Nose abnormal bone marrow signal to suggest acute osseous process. No suspicious osseous nor intradiscal enhancement.  Spinal cord appears normal morphology and signal characteristics with conus medullaris which is partially imaged at T12. No abnormal cord, leptomeningeal or upper dural enhancement. Included prevertebral and paraspinal soft tissues are nonsuspicious.  No significant disc bulge, canal stenosis or neural foraminal narrowing at any thoracic level. Moderate T8-9 facet arthropathy.  Lumbar Findings:  Lumbar vertebral bodies and posterior elements are intact and aligned with maintenance of the lumbar lordosis. Moderate L5-S1 degenerative disc, mild desiccation L3-4 and L4-5. Mild subacute to chronic discogenic endplate changes M8-4 through L4-5. No STIR signal abnormality to suggest acute osseous process. No suspicious osseous nor intradiscal enhancement.  Conus medullaris terminates at T12-L1 and appears normal in morphology and signal characteristics. Cauda equina is unremarkable. No suspicious cord, leptomeningeal nor epidural enhancement. Included prevertebral and paraspinal soft tissues are nonsuspicious.  Level by level evaluation:  L1-2 and L2-3: No disc bulge, canal stenosis nor neural foraminal narrowing.  L3-4: 2 mm broad-based disc bulge, mild facet arthropathy and ligamentum flavum redundancy. Minimal canal stenosis. Mild bilateral neural foraminal narrowing.   L4-5: 2 mm broad-based disc bulge. Mild to moderate facet arthropathy and ligamentum flavum redundancy. Minimal canal stenosis. Moderate bilateral neural foraminal narrowing.  L5-S1: Conjoined RIGHT L5-S1 nerve root. No significant disc bulge. Moderate facet arthropathy without canal stenosis. Mild RIGHT neural foraminal narrowing.  IMPRESSION: MRI CERVICAL SPINE: Severely limited sagittal post gad. Axial T1 post gadolinium sequences not obtained due to inability to tolerate further imaging.  No suspicious MR findings to suggest metastatic disease or infection with cervical spine.  Status post C4-5 ACDF. 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  Moderate canal stenosis at C5-6, mild at C6-7. Severe RIGHT C5-6 neural foraminal narrowing.  MRI THORACIC SPINE: No MR findings to suggest metastatic disease or infection within the thoracic spine.  Mild thoracic degenerative change without neurocompressive findings.  Remote Mild T4 compression fracture.  MRI LUMBAR SPINE: No MR findings to suggest metastatic disease or infection within the lumbar spine.  Degenerative change of the lumbar spine. Minimal canal stenosis L3-4 and L4-5.  Neural foraminal narrowing L3-4 through L5-S1: Moderate bilaterally at L4-5.  Acute findings discussed with and reconfirmed by Dr.JULIE MALLORY on 12/04/2013 at 11:33 pm.   Electronically Signed   By: Elon Alas   On: 12/04/2013 23:34   Mr Lumbar Spine W Wo Contrast  12/04/2013   CLINICAL DATA:  History of stroke, MRSA infection and intravenous drug use presents with fever and leg pain,  multiple falls. Diagnosed with intracranial lesions on December 01, 2013 which may reflect abscess or metastatic disease.  EXAM: MRI TOTAL SPINE WITHOUT AND WITH CONTRAST  TECHNIQUE: Multisequence MR imaging of the spine from the cervical spine to the sacrum was performed prior to and following IV contrast administration for evaluation of  spinal metastatic disease.  CONTRAST:  21mL MULTIHANCE GADOBENATE DIMEGLUMINE 529 MG/ML IV SOLN  COMPARISON:  CT of the chest, abdomen and pelvis December 09, 2013; MRI of the brain December 01, 2013  FINDINGS: Cervical Findings:  Cervical vertebral bodies and posterior elements appear intact and aligned with maintenance of cervical lordosis. Mild motion degraded examination. Status post C4-5 ACDF, which results in some susceptibility artifact. Mild to moderate C5-6 degenerative disc, with decreased T2 signal within all cervical disc consistent with desiccation. Mild chronic discogenic endplate changes at all cervical levels. No STIR signal abnormality to suggest acute osseous process. Limited post gadolinium sequence due to spinal hardware,  Cervical spinal cord appears normal morphology and signal characteristics from the cervical medullary junction to at least level of T3-4, most caudal well visualized level. Craniocervical junction maintained. 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 No convincing evidence of abnormal cord, leptomeningeal or epidural enhancement though, limited post gadolinium sagittal sequences and, due to patient's intolerance for further imaging, axial T1 post gadolinium sequence not obtained.  Level by level evaluation:  C2-3: No disc bulge, canal stenosis or neural foraminal narrowing.  C3-4: Uncovertebral hypertrophy and minimal annular bulging without canal stenosis or neural foraminal narrowing.  C4-5 ACDF.  C5-6: 3 mm RIGHT central broad-based disc protrusion. Uncovertebral hypertrophy. Moderate canal stenosis. Severe RIGHT, moderate to severe LEFT neural foraminal narrowing. There is likely a component adjacent segment disease.  C6-7: 2 mm broad-based disc bulge, mild canal stenosis. Minimal neural foraminal narrowing.  C7-T1: No disc bulge, canal stenosis nor neural foraminal narrowing.  Thoracic Findings:  Mild chronic T4 compression fracture. T2  superior endplate Schmorl's node. No acute fracture. No malalignment. Mild T6 70 T7-8 degenerative discs. Nose abnormal bone marrow signal to suggest acute osseous process. No suspicious osseous nor intradiscal enhancement.  Spinal cord appears normal morphology and signal characteristics with conus medullaris which is partially imaged at T12. No abnormal cord, leptomeningeal or upper dural enhancement. Included prevertebral and paraspinal soft tissues are nonsuspicious.  No significant disc bulge, canal stenosis or neural foraminal narrowing at any thoracic level. Moderate T8-9 facet arthropathy.  Lumbar Findings:  Lumbar vertebral bodies and posterior elements are intact and aligned with maintenance of the lumbar lordosis. Moderate L5-S1 degenerative disc, mild desiccation L3-4 and L4-5. Mild subacute to chronic discogenic endplate changes X4-8 through L4-5. No STIR signal abnormality to suggest acute osseous process. No suspicious osseous nor intradiscal enhancement.  Conus medullaris terminates at T12-L1 and appears normal in morphology and signal characteristics. Cauda equina is unremarkable. No suspicious cord, leptomeningeal nor epidural enhancement. Included prevertebral and paraspinal soft tissues are nonsuspicious.  Level by level evaluation:  L1-2 and L2-3: No disc bulge, canal stenosis nor neural foraminal narrowing.  L3-4: 2 mm broad-based disc bulge, mild facet arthropathy and ligamentum flavum redundancy. Minimal canal stenosis. Mild bilateral neural foraminal narrowing.  L4-5: 2 mm broad-based disc bulge. Mild to moderate facet arthropathy and ligamentum flavum redundancy. Minimal canal stenosis. Moderate bilateral neural foraminal narrowing.  L5-S1: Conjoined RIGHT L5-S1 nerve root. No significant disc bulge. Moderate facet arthropathy without canal stenosis. Mild RIGHT neural foraminal narrowing.  IMPRESSION: MRI CERVICAL SPINE: Severely limited  sagittal post gad. Axial T1 post gadolinium sequences  not obtained due to inability to tolerate further imaging.  No suspicious MR findings to suggest metastatic disease or infection with cervical spine.  Status post C4-5 ACDF. 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  Moderate canal stenosis at C5-6, mild at C6-7. Severe RIGHT C5-6 neural foraminal narrowing.  MRI THORACIC SPINE: No MR findings to suggest metastatic disease or infection within the thoracic spine.  Mild thoracic degenerative change without neurocompressive findings.  Remote Mild T4 compression fracture.  MRI LUMBAR SPINE: No MR findings to suggest metastatic disease or infection within the lumbar spine.  Degenerative change of the lumbar spine. Minimal canal stenosis L3-4 and L4-5.  Neural foraminal narrowing L3-4 through L5-S1: Moderate bilaterally at L4-5.  Acute findings discussed with and reconfirmed by Dr.JULIE MALLORY on 12/04/2013 at 11:33 pm.   Electronically Signed   By: Elon Alas   On: 12/04/2013 23:34     Assessment/Plan:  brain abscess = on vanco, ceftriaxone, and metronidazole. Neurosurgery will be taking patient to OR today, discussed with dr. Ellene Route that we still need culture to help determine what we are treating, we will get aerobic, anaerobic, fungal and afb culture. Will also send specimen for 16s and fungal pcr testing in case that the specimen in sterile already from antibiotic exposure  The increase in size seen on recent NCHCT could possibly due to killing of bacterial and neutrophilic response leading to marked increase in edema. Will empirically start antifungal coverage in case this is fungal brain abscess, which also has high mortality rate. Condition is guarded even with surgical management to debride areas of involvement.   [facility] for Infectious Diseases Cell: [phone] Pager: [phone]  12/05/2013, 9:45 AM

## 2013-12-05 NOTE — Progress Notes (Signed)
Son, [name] asking to speak to Dr. Ellene Route regarding procedure.  Called office and left message with MD scheduler to have MD call son-provided Son's name and phone number.  Son made aware.  Kizzie Bane, RN

## 2013-12-05 NOTE — Progress Notes (Signed)
Patient ID: [name], male   DOB: 30-Sep-1960, 53 y.o.   MRN: [id number] [name] not appears to be having some further deterioration. 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 Yesterday had some trace movement of the left upper extremity and left lower extremity. This is now gone away. I discussed the situation with the patient and the family. They understand this dyer nature of his condition. We will plan on proceeding with the stereotactic guided aspiration of the brain abscess this evening.

## 2013-12-05 NOTE — Progress Notes (Signed)
Patient ID: [name], male   DOB: [birth year], 53 y.o.   MRN: [id number] Patient's condition seems to be deteriorating by CT images obtained yesterday. There is increased edema around the 3 abscesses noted in his brain. The right-sided abscesses seem to have enlarged considerably.  I indicated to the patient that he will require surgical drainage of these abscesses. We will arrange for this today.

## 2013-12-05 NOTE — Op Note (Signed)
Date of surgery: 12/05/2013 Preoperative diagnosis: Multiple brain abscesses Postoperative diagnosis: Multiple brain abscesses Procedure: Stereotactic guided aspiration of brain abscesses Surgeon: Kristeen Miss M.D. First assistant: Erline Levine M.D. Anesthesia: Gen. endotracheal Indications: The patient is a 53 year old male who's had progressive mental deterioration with evidence of 3 individual brain abscesses by CT scan. These have enlarged despite treatment with IV antibiotics.   Procedure: The patient was brought to the operating room supine on a stretcher. He is placed on the operating table after being intubated. He was placed in a 3 pin headrest. His placed in the supine recumbent position. The head was secured with the Brain[employer] navigator system being used. The navigation system was registered to the patient's head. Entry sites were then targeted and verified and the lesions were targeted and verified. On the right side a singular entry site was chosen. Once the entry site was localized and the scalp was prepped after being shaved with clippers cleansed with alcohol and prepped with DuraPrep and draped sterilely. The chosen entry site then was infiltrated with lidocaine 1% with epinephrine for a total of 3 mL. Small vertical incision was created. A burr hole was created in the chosen region on the right side. The Brain[employer] navigator was then used to localized trajectory and the arc was set over the patient's head in the chosen direction. Then by visualizing the trajectory on the image guidance system the past a brain biopsy needle into the lesion. Aspiration of 4 mL of pus was obtained. This was sent for multiple cultures. A second aspiration was attempted but tissue was obtained. This was sent with the culture 0. The needle was withdrawn. The second target was dialed in. This was done through the same entry site. The needle was passed to the second target and aspiration was again obtained obtaining  approximately 2 mL of pus. The target was not felt to yield any further pus. The needle was withdrawn. The dura was inspected was not noted to be bleeding. The galea was closed with 2-0 Vicryl in interrupted fashion. Surgical staples were placed in the scalp. Attention was turned to the third lesion which was on the patient's left side of the head. Entry site was identified visually correlating with the registration on the BrainLab machine. A small vertical incision was made over the chosen entry site. This lesion was immediately adjacent to the bone. A burr hole was created after opening the scalp approximately 2 cm. The dura was identified. It was cauterized with a monopolar cautery which perforated. The needle was then inserted into the brain substance. Small amount of tissue was obtained from this area which appeared very friable. No gross pus was aspirated. The tissue itself was sent for pathologic evaluation as opposed to culture. The needle was withdrawn. The [place] was closed with 2-0 Vicryl interrupted fashion and surgical staples were placed in the scalp. At this point procedure was completed and the patient was removed from the 3 pin headrest. He was noted to be stable blood loss was less than 5 mL. Patient will be returned to the ICU after being extubated.

## 2013-12-05 NOTE — Progress Notes (Signed)
Nurse notified MD, Dr. Hulen Luster of pt's sodium 128.  Angeline Slim I 12/05/2013 8:17 AM

## 2013-12-05 NOTE — Progress Notes (Signed)
PT Cancellation Note  Patient Details Name: NAVEN GIAMBALVO MRN: [id number] DOB: 14-Dec-1960   Cancelled Treatment:    Reason Eval/Treat Not Completed: Patient at procedure or test/unavailable. Nursing preparing pt to go for TEE.   [place] Gustafson 12/05/2013, 4:16 PM Pager [phone]

## 2013-12-05 NOTE — Anesthesia Preprocedure Evaluation (Signed)
Anesthesia Evaluation  Patient identified by MRN, date of birth, ID band Patient awake    Reviewed: Allergy & Precautions, H&P , NPO status , Patient's Chart, lab work & pertinent test results  Airway Mallampati: II  TM Distance: >3 FB Neck ROM: Full    Dental   Pulmonary shortness of breath, sleep apnea , COPDCurrent Smoker,          Cardiovascular hypertension, Pt. on medications + Peripheral Vascular Disease     Neuro/Psych    GI/Hepatic GERD-  Medicated and Controlled,(+) Hepatitis -, C  Endo/Other  diabetes, Type 2, Insulin Dependent  Renal/GU      Musculoskeletal   Abdominal   Peds  Hematology   Anesthesia Other Findings   Reproductive/Obstetrics                             Anesthesia Physical Anesthesia Plan  ASA: IV  Anesthesia Plan: General ETT   Post-op Pain Management:    Induction:   Airway Management Planned:   Additional Equipment:   Intra-op Plan:   Post-operative Plan:   Informed Consent:   Plan Discussed with:   Anesthesia Plan Comments:         Anesthesia Quick Evaluation

## 2013-12-05 NOTE — Anesthesia Postprocedure Evaluation (Signed)
Anesthesia Post Note  Patient: [name]  Procedure(s) Performed: Procedure(s) (LRB): FLEXIBLE SIGMOIDOSCOPY (N/A)  Anesthesia type: general  Patient location: PACU  Post pain: Pain level controlled  Post assessment: Patient's Cardiovascular Status Stable  Last Vitals:  Filed Vitals:   12/05/13 0916  BP: 141/85  Pulse: 84  Temp: 36.5 C  Resp: 20    Post vital signs: Reviewed and stable  Level of consciousness: sedated  Complications: No apparent anesthesia complications

## 2013-12-05 NOTE — Transfer of Care (Signed)
Immediate Anesthesia Transfer of Care Note  Patient: [name]  Procedure(s) Performed: Procedure(s): Bilateral Aspiration of Brain Abscess (Bilateral)  Patient Location: PACU  Anesthesia Type:General  Level of Consciousness: sedated and responds to stimulation  Airway & Oxygen Therapy: Patient Spontanous Breathing and Patient connected to face mask oxygen  Post-op Assessment: Report given to PACU RN and Post -op Vital signs reviewed and stable  Post vital signs: Reviewed and stable  Complications: No apparent anesthesia complications

## 2013-12-05 NOTE — Progress Notes (Signed)
UR COMPLETED  

## 2013-12-05 NOTE — Progress Notes (Signed)
Per sitter, [name], pt calm, cooperative in bed.  Pt's 2 sons at bedside. Will continue to closely monitor.   Angeline Slim I 12/05/2013 1:14 PM

## 2013-12-05 NOTE — Progress Notes (Signed)
Pt's male friend at pt's bedside.  Pt's friend visibly under the influence.  Pt was become upset and agitated. Pt's friend was agitated, cursing looking for her personal belongings in other pt room where pt was previously.  Nurse informed charge nurse, Vicente Males, and Publishing copy.  Security was informed and arrived and escorted pt out of unit.b   Angeline Slim I 12/05/2013 1:12 PM

## 2013-12-05 NOTE — Progress Notes (Signed)
Nurse notified by sitter that pt complained of left sided chest pain. Nurse arrived to bedside and assessed. Pt stated chest pain to left side. Nurse paged MD, Dr. Hulen Luster. MD called back immediately. Vitals taken, EKG in progress. MD stated will arrive to pt's bedside.  Will monitor pt closely.   Angeline Slim I 12/05/2013 9:19 AM

## 2013-12-05 NOTE — Progress Notes (Signed)
    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  After careful review of history and examination, the risks and benefits of transesophageal echocardiogram have been explained to the patient and family members present (2 sons and a daughter) including risks of esophageal damage, perforation (1:10,000 risk), bleeding, pharyngeal hematoma as well as other potential complications associated with conscious sedation including aspiration, arrhythmia, respiratory failure and death. Alternatives to treatment were discussed, questions were answered. The pt's family expressed some concern about repeated sedation for procedures and the pt's declining mental status. The pt has been determined competent per psych consult 12/04/13  Patient and family are willing to proceed.  Procedure scheduled for 9:30 am with Dr Marlou Porch. orders written.   [name] Ransom PA-C 12/05/2013 3:56 PM

## 2013-12-05 NOTE — Progress Notes (Signed)
Subjective: Pt lethargic this morning w/ intelligible responses.   Overnight pt had rectal pain from flex sig procedure, given rectal cream by overnight team.  ,  During rounds pt reported chest pain, EKG WNL and unchanged from prior EKG, sating in the high 90's on room air. CP resolved shortly after.  Objective: Vital signs in last 24 hours: Filed Vitals:   12/04/13 2341 12/05/13 0145 12/05/13 0256 12/05/13 0534  BP: 117/80 143/82  136/75  Pulse: 96 103  94  Temp: 97.7 F (36.5 C) 97.3 F (36.3 C)  98.8 F (37.1 C)  TempSrc: Axillary Oral  Axillary  Resp: 28 20  20   Height:      Weight:      SpO2: 96% 97% 97% 94%   Weight change:   Intake/Output Summary (Last 24 hours) at 12/05/13 2119 Last data filed at 12/05/13 0540  Gross per 24 hour  Intake    250 ml  Output   1825 ml  Net  -1575 ml   PE General: diaphoretic, lethargic, arouses to sternal rub HEENT: missing upper teeth, poor dentition on lower teeth, mucous at medial corner of left eye, left pupil sluggish, unable to check rt pupil- pt was squeezing eye shut Lungs: anterior auscultation reveals coarse rhonchi b/l Cardiac: difficult to auscult 2/2 to coarse upper airway sounds ER:DEYC, scaphoid abdomen Ext: able to move rt toes and move left leg. Poor effort on left hand grip, 4/5 rt hand grip  Lab Results: Basic Metabolic Panel:  Recent Labs Lab 12/01/13 0414 12/02/13 0624 12/05/13 0605  NA 139 134* 128*  K 3.6* 4.1 4.7  CL 102 98 94*  CO2 25 21 17*  GLUCOSE 97 106* 137*  BUN 11 9 20   CREATININE 0.68 0.67 0.58  CALCIUM 8.5 8.5 9.3  MG 2.0  --   --    Liver Function Tests:  Recent Labs Lab 11/30/13 2216 12/01/13 0414  AST 82* 60*  ALT 40 34  ALKPHOS 77 73  BILITOT 0.4 0.4  PROT 8.8* 7.6  ALBUMIN 2.6* 2.4*   CBC:  Recent Labs Lab 12/02/13 0624 12/05/13 0605  WBC 10.6* 10.5  NEUTROABS 7.3 8.9*  HGB 11.3* 14.1  HCT 35.1* 42.4  MCV 80.7 81.4  PLT 326 456*   Cardiac  Enzymes:  Recent Labs Lab 11/30/13 2216  TROPONINI <0.30   BNP:  Recent Labs Lab 11/30/13 2216  PROBNP 397.8*   CBG:  Recent Labs Lab 12/03/13 1154 12/03/13 1618 12/03/13 2208 12/04/13 0635 12/04/13 2347 12/05/13 0639  GLUCAP 157* 119* 98 123* 132* 135*   Urine Drug Screen: Drugs of Abuse     Component Value Date/Time   LABOPIA POSITIVE* 12/01/2013 0533   LABOPIA NEG 06/01/2011 1115   COCAINSCRNUR NONE DETECTED 12/01/2013 0533   COCAINSCRNUR NEG 06/01/2011 1115   LABBENZ NONE DETECTED 12/01/2013 0533   LABBENZ NEG 06/01/2011 1115   LABBENZ NEG 02/18/2009 2050   AMPHETMU NONE DETECTED 12/01/2013 0533   AMPHETMU NEG 06/01/2011 1115   AMPHETMU NEG 02/18/2009 2050   THCU NONE DETECTED 12/01/2013 0533   THCU NEG 06/01/2011 1115   LABBARB NONE DETECTED 12/01/2013 0533   LABBARB NEG 06/01/2011 1115      Micro Results: Recent Results (from the past 240 hour(s))  Urine culture     Status: None   Collection Time: 11/30/13 11:20 PM  Result Value Ref Range Status   Specimen Description URINE, CLEAN CATCH  Final   Special Requests NONE  Final  Culture  Setup Time   Final    12/01/2013 09:01 Performed at [place] Performed at [facility]   Final   Culture NO GROWTH Performed at [facility]   Final   Report Status 12/02/2013 FINAL  Final  Blood culture (routine x 2)     Status: None (Preliminary result)   Collection Time: 12/01/13 12:05 AM  Result Value Ref Range Status   Specimen Description BLOOD RIGHT ARM  Final   Special Requests BOTTLES DRAWN AEROBIC AND ANAEROBIC 5CC  Final   Culture  Setup Time   Final    12/01/2013 04:15 Performed at [facility]    Culture   Final           BLOOD CULTURE RECEIVED NO GROWTH TO DATE CULTURE WILL BE HELD FOR 5 DAYS BEFORE ISSUING A FINAL NEGATIVE REPORT Performed at [facility]    Report Status PENDING  Incomplete  Blood culture (routine x  2)     Status: None (Preliminary result)   Collection Time: 12/01/13 12:08 AM  Result Value Ref Range Status   Specimen Description BLOOD RIGHT HAND  Final   Special Requests BOTTLES DRAWN AEROBIC AND ANAEROBIC 5CC  Final   Culture  Setup Time   Final    12/01/2013 04:15 Performed at [facility]    Culture   Final           BLOOD CULTURE RECEIVED NO GROWTH TO DATE CULTURE WILL BE HELD FOR 5 DAYS BEFORE ISSUING A FINAL NEGATIVE REPORT Performed at [facility]    Report Status PENDING  Incomplete  Culture, expectorated sputum-assessment     Status: None   Collection Time: 12/01/13  4:33 AM  Result Value Ref Range Status   Specimen Description SPUTUM  Final   Special Requests NONE  Final   Sputum evaluation   Final    MICROSCOPIC FINDINGS SUGGEST THAT THIS SPECIMEN IS NOT REPRESENTATIVE OF LOWER RESPIRATORY SECRETIONS. PLEASE RECOLLECT. CALLED TO [place] [phone] 12/01/13 E.GADDY    Report Status 12/01/2013 FINAL  Final   Studies/Results: Ct Head Wo Contrast  12/04/2013   CLINICAL DATA:  Altered mental status.  Multiple brain lesions.  EXAM: CT HEAD WITHOUT CONTRAST  TECHNIQUE: Contiguous axial images were obtained from the base of the skull through the vertex without intravenous contrast.  COMPARISON:  MRI dated 12/01/2013 and is CT scan dated 11/30/2013  FINDINGS: There has been rapidly progression in the apparent size of brain lesions in the right basal ganglia, posterior left frontal lobe, and right frontal centrum semiovale. There is increased edema around each lesion with increased compression of frontal horn of the right lateral ventricle with new midline shift of 3.7 mm.  IMPRESSION: Rapid progression in the size of the multiple brain lesions with due right to left midline shift and increased edema surrounding the lesions. The possibility of progressive brain abscess should be considered particularly in view of the rapid progression.   Electronically Signed   By:  Rozetta Nunnery M.D.   On: 12/04/2013 18:51   Mr Cervical Spine W Wo Contrast  12/04/2013   CLINICAL DATA:  History of stroke, MRSA infection and intravenous drug use presents with fever and leg pain, multiple falls. Diagnosed with intracranial lesions on December 01, 2013 which may reflect abscess or metastatic disease.  EXAM: MRI TOTAL SPINE WITHOUT AND WITH CONTRAST  TECHNIQUE: Multisequence MR imaging of the  spine from the cervical spine to the sacrum was performed prior to and following IV contrast administration for evaluation of spinal metastatic disease.  CONTRAST:  51mL MULTIHANCE GADOBENATE DIMEGLUMINE 529 MG/ML IV SOLN  COMPARISON:  CT of the chest, abdomen and pelvis December 09, 2013; MRI of the brain December 01, 2013  FINDINGS: Cervical Findings:  Cervical vertebral bodies and posterior elements appear intact and aligned with maintenance of cervical lordosis. Mild motion degraded examination. Status post C4-5 ACDF, which results in some susceptibility artifact. Mild to moderate C5-6 degenerative disc, with decreased T2 signal within all cervical disc consistent with desiccation. Mild chronic discogenic endplate changes at all cervical levels. No STIR signal abnormality to suggest acute osseous process. Limited post gadolinium sequence due to spinal hardware,  Cervical spinal cord appears normal morphology and signal characteristics from the cervical medullary junction to at least level of T3-4, most caudal well visualized level. Craniocervical junction maintained. 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 No convincing evidence of abnormal cord, leptomeningeal or epidural enhancement though, limited post gadolinium sagittal sequences and, due to patient's intolerance for further imaging, axial T1 post gadolinium sequence not obtained.  Level by level evaluation:  C2-3: No disc bulge, canal stenosis or neural foraminal narrowing.  C3-4: Uncovertebral hypertrophy and  minimal annular bulging without canal stenosis or neural foraminal narrowing.  C4-5 ACDF.  C5-6: 3 mm RIGHT central broad-based disc protrusion. Uncovertebral hypertrophy. Moderate canal stenosis. Severe RIGHT, moderate to severe LEFT neural foraminal narrowing. There is likely a component adjacent segment disease.  C6-7: 2 mm broad-based disc bulge, mild canal stenosis. Minimal neural foraminal narrowing.  C7-T1: No disc bulge, canal stenosis nor neural foraminal narrowing.  Thoracic Findings:  Mild chronic T4 compression fracture. T2 superior endplate Schmorl's node. No acute fracture. No malalignment. Mild T6 70 T7-8 degenerative discs. Nose abnormal bone marrow signal to suggest acute osseous process. No suspicious osseous nor intradiscal enhancement.  Spinal cord appears normal morphology and signal characteristics with conus medullaris which is partially imaged at T12. No abnormal cord, leptomeningeal or upper dural enhancement. Included prevertebral and paraspinal soft tissues are nonsuspicious.  No significant disc bulge, canal stenosis or neural foraminal narrowing at any thoracic level. Moderate T8-9 facet arthropathy.  Lumbar Findings:  Lumbar vertebral bodies and posterior elements are intact and aligned with maintenance of the lumbar lordosis. Moderate L5-S1 degenerative disc, mild desiccation L3-4 and L4-5. Mild subacute to chronic discogenic endplate changes N8-2 through L4-5. No STIR signal abnormality to suggest acute osseous process. No suspicious osseous nor intradiscal enhancement.  Conus medullaris terminates at T12-L1 and appears normal in morphology and signal characteristics. Cauda equina is unremarkable. No suspicious cord, leptomeningeal nor epidural enhancement. Included prevertebral and paraspinal soft tissues are nonsuspicious.  Level by level evaluation:  L1-2 and L2-3: No disc bulge, canal stenosis nor neural foraminal narrowing.  L3-4: 2 mm broad-based disc bulge, mild facet  arthropathy and ligamentum flavum redundancy. Minimal canal stenosis. Mild bilateral neural foraminal narrowing.  L4-5: 2 mm broad-based disc bulge. Mild to moderate facet arthropathy and ligamentum flavum redundancy. Minimal canal stenosis. Moderate bilateral neural foraminal narrowing.  L5-S1: Conjoined RIGHT L5-S1 nerve root. No significant disc bulge. Moderate facet arthropathy without canal stenosis. Mild RIGHT neural foraminal narrowing.  IMPRESSION: MRI CERVICAL SPINE: Severely limited sagittal post gad. Axial T1 post gadolinium sequences not obtained due to inability to tolerate further imaging.  No suspicious MR findings to suggest metastatic disease or infection with cervical spine.  Status  post C4-5 ACDF. 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  Moderate canal stenosis at C5-6, mild at C6-7. Severe RIGHT C5-6 neural foraminal narrowing.  MRI THORACIC SPINE: No MR findings to suggest metastatic disease or infection within the thoracic spine.  Mild thoracic degenerative change without neurocompressive findings.  Remote Mild T4 compression fracture.  MRI LUMBAR SPINE: No MR findings to suggest metastatic disease or infection within the lumbar spine.  Degenerative change of the lumbar spine. Minimal canal stenosis L3-4 and L4-5.  Neural foraminal narrowing L3-4 through L5-S1: Moderate bilaterally at L4-5.  Acute findings discussed with and reconfirmed by Dr.JULIE MALLORY on 12/04/2013 at 11:33 pm.   Electronically Signed   By: Elon Alas   On: 12/04/2013 23:34   Mr Thoracic Spine W Wo Contrast  12/04/2013   CLINICAL DATA:  History of stroke, MRSA infection and intravenous drug use presents with fever and leg pain, multiple falls. Diagnosed with intracranial lesions on December 01, 2013 which may reflect abscess or metastatic disease.  EXAM: MRI TOTAL SPINE WITHOUT AND WITH CONTRAST  TECHNIQUE: Multisequence MR imaging of the spine from the  cervical spine to the sacrum was performed prior to and following IV contrast administration for evaluation of spinal metastatic disease.  CONTRAST:  66mL MULTIHANCE GADOBENATE DIMEGLUMINE 529 MG/ML IV SOLN  COMPARISON:  CT of the chest, abdomen and pelvis December 09, 2013; MRI of the brain December 01, 2013  FINDINGS: Cervical Findings:  Cervical vertebral bodies and posterior elements appear intact and aligned with maintenance of cervical lordosis. Mild motion degraded examination. Status post C4-5 ACDF, which results in some susceptibility artifact. Mild to moderate C5-6 degenerative disc, with decreased T2 signal within all cervical disc consistent with desiccation. Mild chronic discogenic endplate changes at all cervical levels. No STIR signal abnormality to suggest acute osseous process. Limited post gadolinium sequence due to spinal hardware,  Cervical spinal cord appears normal morphology and signal characteristics from the cervical medullary junction to at least level of T3-4, most caudal well visualized level. Craniocervical junction maintained. 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 No convincing evidence of abnormal cord, leptomeningeal or epidural enhancement though, limited post gadolinium sagittal sequences and, due to patient's intolerance for further imaging, axial T1 post gadolinium sequence not obtained.  Level by level evaluation:  C2-3: No disc bulge, canal stenosis or neural foraminal narrowing.  C3-4: Uncovertebral hypertrophy and minimal annular bulging without canal stenosis or neural foraminal narrowing.  C4-5 ACDF.  C5-6: 3 mm RIGHT central broad-based disc protrusion. Uncovertebral hypertrophy. Moderate canal stenosis. Severe RIGHT, moderate to severe LEFT neural foraminal narrowing. There is likely a component adjacent segment disease.  C6-7: 2 mm broad-based disc bulge, mild canal stenosis. Minimal neural foraminal narrowing.  C7-T1: No disc bulge,  canal stenosis nor neural foraminal narrowing.  Thoracic Findings:  Mild chronic T4 compression fracture. T2 superior endplate Schmorl's node. No acute fracture. No malalignment. Mild T6 70 T7-8 degenerative discs. Nose abnormal bone marrow signal to suggest acute osseous process. No suspicious osseous nor intradiscal enhancement.  Spinal cord appears normal morphology and signal characteristics with conus medullaris which is partially imaged at T12. No abnormal cord, leptomeningeal or upper dural enhancement. Included prevertebral and paraspinal soft tissues are nonsuspicious.  No significant disc bulge, canal stenosis or neural foraminal narrowing at any thoracic level. Moderate T8-9 facet arthropathy.  Lumbar Findings:  Lumbar vertebral bodies and posterior elements are intact and aligned with maintenance of  the lumbar lordosis. Moderate L5-S1 degenerative disc, mild desiccation L3-4 and L4-5. Mild subacute to chronic discogenic endplate changes W2-3 through L4-5. No STIR signal abnormality to suggest acute osseous process. No suspicious osseous nor intradiscal enhancement.  Conus medullaris terminates at T12-L1 and appears normal in morphology and signal characteristics. Cauda equina is unremarkable. No suspicious cord, leptomeningeal nor epidural enhancement. Included prevertebral and paraspinal soft tissues are nonsuspicious.  Level by level evaluation:  L1-2 and L2-3: No disc bulge, canal stenosis nor neural foraminal narrowing.  L3-4: 2 mm broad-based disc bulge, mild facet arthropathy and ligamentum flavum redundancy. Minimal canal stenosis. Mild bilateral neural foraminal narrowing.  L4-5: 2 mm broad-based disc bulge. Mild to moderate facet arthropathy and ligamentum flavum redundancy. Minimal canal stenosis. Moderate bilateral neural foraminal narrowing.  L5-S1: Conjoined RIGHT L5-S1 nerve root. No significant disc bulge. Moderate facet arthropathy without canal stenosis. Mild RIGHT neural foraminal  narrowing.  IMPRESSION: MRI CERVICAL SPINE: Severely limited sagittal post gad. Axial T1 post gadolinium sequences not obtained due to inability to tolerate further imaging.  No suspicious MR findings to suggest metastatic disease or infection with cervical spine.  Status post C4-5 ACDF. 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  Moderate canal stenosis at C5-6, mild at C6-7. Severe RIGHT C5-6 neural foraminal narrowing.  MRI THORACIC SPINE: No MR findings to suggest metastatic disease or infection within the thoracic spine.  Mild thoracic degenerative change without neurocompressive findings.  Remote Mild T4 compression fracture.  MRI LUMBAR SPINE: No MR findings to suggest metastatic disease or infection within the lumbar spine.  Degenerative change of the lumbar spine. Minimal canal stenosis L3-4 and L4-5.  Neural foraminal narrowing L3-4 through L5-S1: Moderate bilaterally at L4-5.  Acute findings discussed with and reconfirmed by Dr.JULIE MALLORY on 12/04/2013 at 11:33 pm.   Electronically Signed   By: Elon Alas   On: 12/04/2013 23:34   Mr Lumbar Spine W Wo Contrast  12/04/2013   CLINICAL DATA:  History of stroke, MRSA infection and intravenous drug use presents with fever and leg pain, multiple falls. Diagnosed with intracranial lesions on December 01, 2013 which may reflect abscess or metastatic disease.  EXAM: MRI TOTAL SPINE WITHOUT AND WITH CONTRAST  TECHNIQUE: Multisequence MR imaging of the spine from the cervical spine to the sacrum was performed prior to and following IV contrast administration for evaluation of spinal metastatic disease.  CONTRAST:  63mL MULTIHANCE GADOBENATE DIMEGLUMINE 529 MG/ML IV SOLN  COMPARISON:  CT of the chest, abdomen and pelvis December 09, 2013; MRI of the brain December 01, 2013  FINDINGS: Cervical Findings:  Cervical vertebral bodies and posterior elements appear intact and aligned with maintenance of  cervical lordosis. Mild motion degraded examination. Status post C4-5 ACDF, which results in some susceptibility artifact. Mild to moderate C5-6 degenerative disc, with decreased T2 signal within all cervical disc consistent with desiccation. Mild chronic discogenic endplate changes at all cervical levels. No STIR signal abnormality to suggest acute osseous process. Limited post gadolinium sequence due to spinal hardware,  Cervical spinal cord appears normal morphology and signal characteristics from the cervical medullary junction to at least level of T3-4, most caudal well visualized level. Craniocervical junction maintained. 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 No convincing evidence of abnormal cord, leptomeningeal or epidural enhancement though, limited post gadolinium sagittal sequences and, due to patient's intolerance for further imaging, axial T1 post gadolinium sequence not obtained.  Level by level evaluation:  C2-3: No disc bulge, canal stenosis or neural foraminal narrowing.  C3-4: Uncovertebral hypertrophy and minimal annular bulging without canal stenosis or neural foraminal narrowing.  C4-5 ACDF.  C5-6: 3 mm RIGHT central broad-based disc protrusion. Uncovertebral hypertrophy. Moderate canal stenosis. Severe RIGHT, moderate to severe LEFT neural foraminal narrowing. There is likely a component adjacent segment disease.  C6-7: 2 mm broad-based disc bulge, mild canal stenosis. Minimal neural foraminal narrowing.  C7-T1: No disc bulge, canal stenosis nor neural foraminal narrowing.  Thoracic Findings:  Mild chronic T4 compression fracture. T2 superior endplate Schmorl's node. No acute fracture. No malalignment. Mild T6 70 T7-8 degenerative discs. Nose abnormal bone marrow signal to suggest acute osseous process. No suspicious osseous nor intradiscal enhancement.  Spinal cord appears normal morphology and signal characteristics with conus medullaris which is partially  imaged at T12. No abnormal cord, leptomeningeal or upper dural enhancement. Included prevertebral and paraspinal soft tissues are nonsuspicious.  No significant disc bulge, canal stenosis or neural foraminal narrowing at any thoracic level. Moderate T8-9 facet arthropathy.  Lumbar Findings:  Lumbar vertebral bodies and posterior elements are intact and aligned with maintenance of the lumbar lordosis. Moderate L5-S1 degenerative disc, mild desiccation L3-4 and L4-5. Mild subacute to chronic discogenic endplate changes T5-1 through L4-5. No STIR signal abnormality to suggest acute osseous process. No suspicious osseous nor intradiscal enhancement.  Conus medullaris terminates at T12-L1 and appears normal in morphology and signal characteristics. Cauda equina is unremarkable. No suspicious cord, leptomeningeal nor epidural enhancement. Included prevertebral and paraspinal soft tissues are nonsuspicious.  Level by level evaluation:  L1-2 and L2-3: No disc bulge, canal stenosis nor neural foraminal narrowing.  L3-4: 2 mm broad-based disc bulge, mild facet arthropathy and ligamentum flavum redundancy. Minimal canal stenosis. Mild bilateral neural foraminal narrowing.  L4-5: 2 mm broad-based disc bulge. Mild to moderate facet arthropathy and ligamentum flavum redundancy. Minimal canal stenosis. Moderate bilateral neural foraminal narrowing.  L5-S1: Conjoined RIGHT L5-S1 nerve root. No significant disc bulge. Moderate facet arthropathy without canal stenosis. Mild RIGHT neural foraminal narrowing.  IMPRESSION: MRI CERVICAL SPINE: Severely limited sagittal post gad. Axial T1 post gadolinium sequences not obtained due to inability to tolerate further imaging.  No suspicious MR findings to suggest metastatic disease or infection with cervical spine.  Status post C4-5 ACDF. 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  Moderate canal stenosis at C5-6, mild at C6-7. Severe  RIGHT C5-6 neural foraminal narrowing.  MRI THORACIC SPINE: No MR findings to suggest metastatic disease or infection within the thoracic spine.  Mild thoracic degenerative change without neurocompressive findings.  Remote Mild T4 compression fracture.  MRI LUMBAR SPINE: No MR findings to suggest metastatic disease or infection within the lumbar spine.  Degenerative change of the lumbar spine. Minimal canal stenosis L3-4 and L4-5.  Neural foraminal narrowing L3-4 through L5-S1: Moderate bilaterally at L4-5.  Acute findings discussed with and reconfirmed by Dr.JULIE MALLORY on 12/04/2013 at 11:33 pm.   Electronically Signed   By: Elon Alas   On: 12/04/2013 23:34   Medications: I have reviewed the patient's current medications. Scheduled Meds: . cefTRIAXone (ROCEPHIN)  IV  2 g Intravenous Q12H  . dexamethasone  4 mg Intravenous 4 times per day  . famotidine  10 mg Oral BID  . feeding supplement (GLUCERNA SHAKE)  237 mL Oral BID BM  . fenofibrate  54 mg Oral Daily  . Influenza vac split quadrivalent PF  0.5 mL Intramuscular Tomorrow-1000  .  metronidazole  500 mg Intravenous Q6H  . mometasone-formoterol  2 puff Inhalation BID  . multivitamin with minerals  1 tablet Oral Daily  . neomycin-bacitracin-polymyxin   Topical BID  . nicotine  21 mg Transdermal Q24H  . pantoprazole  40 mg Oral BID  . polyethylene glycol  17 g Oral BID  . vancomycin  1,000 mg Intravenous Q8H   Continuous Infusions: . sodium chloride 20 mL/hr at 12/03/13 0805  . sodium chloride 20 mL/hr at 12/04/13 1724   PRN Meds:.acetaminophen, albuterol, clonazePAM, diclofenac sodium, hydrocortisone cream, HYDROmorphone (DILAUDID) injection, hydrOXYzine, ipratropium-albuterol, morphine injection Assessment/Plan: Principal Problem:   Brain mass Active Problems:   Dyslipidemia   Anxiety state   HYPERTENSION, BENIGN ESSENTIAL   Peripheral vascular disease   COPD (chronic obstructive pulmonary disease)   Tobacco abuse    Abnormality of gait   Loss of weight   Malnutrition   Pneumonia   Heroin abuse   Protein-calorie malnutrition, severe   Malignancy   Rectal mass   Abnormal CT scan, pelvis   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 Decadron 10mg  given once and started on decadron 4mg  q6h. Dr. Ellene Route contacted this am who will proceed w/ biopsy of rt abscess. Will get anaerobic, and fungal cultures.  - TEE ordered 2/2 IVDU, called yesterday cardsmaster who placed him on schedule, will call again today - blood cultures NGTD - Neuro checks - Dr. Baxter Flattery w/ ID following, recommend to start vanc, flagyl, and rocephin; day 4. Increased abscess size could be 2/2 to neutrophilic response leading to edema. Started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

## 2013-12-05 NOTE — Anesthesia Procedure Notes (Signed)
Procedure Name: Intubation Date/Time: 12/05/2013 8:54 PM Performed by: Eligha Bridegroom Pre-anesthesia Checklist: Patient identified, Timeout performed, Emergency Drugs available, Suction available and Patient being monitored Patient Re-evaluated:Patient Re-evaluated prior to inductionOxygen Delivery Method: Circle system utilized Preoxygenation: Pre-oxygenation with 100% oxygen Intubation Type: IV induction and Cricoid Pressure applied Ventilation: Mask ventilation without difficulty Laryngoscope Size: Mac and 4 Grade View: Grade III Tube type: Oral Tube size: 7.5 mm Number of attempts: 1 Airway Equipment and Method: Stylet Placement Confirmation: ETT inserted through vocal cords under direct vision,  breath sounds checked- equal and bilateral and positive ETCO2 Secured at: 23 cm Tube secured with: Tape Dental Injury: Teeth and Oropharynx as per pre-operative assessment

## 2013-12-06 ENCOUNTER — Inpatient Hospital Stay (HOSPITAL_COMMUNITY): Payer: Medicaid Other

## 2013-12-06 ENCOUNTER — Encounter (HOSPITAL_COMMUNITY)
Admission: EM | Disposition: A | Discharge status: Skilled Nursing Facility | Payer: Self-pay | Source: Home / Self Care | Attending: Internal Medicine

## 2013-12-06 ENCOUNTER — Encounter (HOSPITAL_COMMUNITY): Payer: Self-pay

## 2013-12-06 DIAGNOSIS — R531 Weakness: Secondary | ICD-10-CM

## 2013-12-06 DIAGNOSIS — E43 Unspecified severe protein-calorie malnutrition: Secondary | ICD-10-CM

## 2013-12-06 DIAGNOSIS — Z515 Encounter for palliative care: Secondary | ICD-10-CM

## 2013-12-06 DIAGNOSIS — Z7189 Other specified counseling: Secondary | ICD-10-CM

## 2013-12-06 DIAGNOSIS — F1120 Opioid dependence, uncomplicated: Secondary | ICD-10-CM

## 2013-12-06 DIAGNOSIS — M545 Low back pain: Secondary | ICD-10-CM

## 2013-12-06 DIAGNOSIS — G060 Intracranial abscess and granuloma: Secondary | ICD-10-CM | POA: Diagnosis present

## 2013-12-06 LAB — BASIC METABOLIC PANEL
Anion gap: 15 (ref 5–15)
BUN: 28 mg/dL — ABNORMAL HIGH (ref 6–23)
CALCIUM: 8.9 mg/dL (ref 8.4–10.5)
CO2: 18 meq/L — AB (ref 19–32)
CREATININE: 0.99 mg/dL (ref 0.50–1.35)
Chloride: 101 mEq/L (ref 96–112)
GFR calc Af Amer: >90 mL/min (ref >90)
GLUCOSE: 115 mg/dL — AB (ref 70–99)
Potassium: 4.4 mEq/L (ref 3.7–5.3)
Sodium: 134 mEq/L — ABNORMAL LOW (ref 137–147)

## 2013-12-06 LAB — GLUCOSE, CAPILLARY
GLUCOSE-CAPILLARY: 108 mg/dL — AB (ref 70–99)
GLUCOSE-CAPILLARY: 124 mg/dL — AB (ref 70–99)
Glucose-Capillary: 131 mg/dL — ABNORMAL HIGH (ref 70–99)
Glucose-Capillary: 94 mg/dL (ref 70–99)

## 2013-12-06 LAB — CBC WITH DIFFERENTIAL/PLATELET
BASOS ABS: 0 10*3/uL (ref 0.0–0.1)
Basophils Relative: 0 % (ref 0–1)
EOS PCT: 0 % (ref 0–5)
Eosinophils Absolute: 0 10*3/uL (ref 0.0–0.7)
HCT: 38.9 % — ABNORMAL LOW (ref 39.0–52.0)
Hemoglobin: 12.5 g/dL — ABNORMAL LOW (ref 13.0–17.0)
Lymphocytes Relative: 14 % (ref 12–46)
Lymphs Abs: 1.5 10*3/uL (ref 0.7–4.0)
MCH: 26 pg (ref 26.0–34.0)
MCHC: 32.1 g/dL (ref 30.0–36.0)
MCV: 80.9 fL (ref 78.0–100.0)
MONO ABS: 0.8 10*3/uL (ref 0.1–1.0)
Monocytes Relative: 7 % (ref 3–12)
Neutro Abs: 8.5 10*3/uL — ABNORMAL HIGH (ref 1.7–7.7)
Neutrophils Relative %: 79 % — ABNORMAL HIGH (ref 43–77)
PLATELETS: 445 10*3/uL — AB (ref 150–400)
RBC: 4.81 MIL/uL (ref 4.22–5.81)
RDW: 15.4 % (ref 11.5–15.5)
WBC: 10.8 10*3/uL — ABNORMAL HIGH (ref 4.0–10.5)

## 2013-12-06 LAB — OSMOLALITY, URINE: Osmolality, Ur: 492 mOsm/kg (ref 390–1090)

## 2013-12-06 LAB — VANCOMYCIN, TROUGH: VANCOMYCIN TR: 35.6 ug/mL — AB (ref 10.0–20.0)

## 2013-12-06 LAB — TROPONIN I

## 2013-12-06 SURGERY — ECHOCARDIOGRAM, TRANSESOPHAGEAL
Anesthesia: Moderate Sedation

## 2013-12-06 MED ORDER — JEVITY 1.2 CAL PO LIQD
1000.0000 mL | ORAL | Status: DC
  Administered 2013-12-06: 20 mL/h
  Administered 2013-12-08 – 2013-12-13 (×7): 1000 mL
  Filled 2013-12-06 (×18): qty 1000

## 2013-12-06 MED ORDER — ONDANSETRON HCL 4 MG/2ML IJ SOLN
4.0000 mg | INTRAMUSCULAR | Status: DC | PRN
Start: 2013-12-06 — End: 2013-12-17
  Filled 2013-12-06: qty 2

## 2013-12-06 MED ORDER — MORPHINE SULFATE 4 MG/ML IJ SOLN
3.0000 mg | INTRAMUSCULAR | Status: DC | PRN
  Administered 2013-12-06: 3 mg via INTRAVENOUS
  Filled 2013-12-06: qty 1

## 2013-12-06 MED ORDER — CLONAZEPAM 0.1 MG/ML ORAL SUSPENSION: 0.5000 mg | Freq: Two times a day (BID) | ORAL | Status: DC | PRN

## 2013-12-06 MED ORDER — PANTOPRAZOLE SODIUM 40 MG IV SOLR
40.0000 mg | Freq: Every day | INTRAVENOUS | Status: DC
  Administered 2013-12-06 – 2013-12-16 (×12): 40 mg via INTRAVENOUS
  Filled 2013-12-06 (×13): qty 40

## 2013-12-06 MED ORDER — PROMETHAZINE HCL 12.5 MG PO TABS
12.5000 mg | ORAL_TABLET | ORAL | Status: DC | PRN
  Administered 2013-12-13: 25 mg via ORAL
  Filled 2013-12-06 (×3): qty 2

## 2013-12-06 MED ORDER — ONDANSETRON HCL 4 MG PO TABS
4.0000 mg | ORAL_TABLET | ORAL | Status: DC | PRN
  Filled 2013-12-06: qty 1

## 2013-12-06 MED ORDER — LEVETIRACETAM IN NACL 500 MG/100ML IV SOLN
500.0000 mg | Freq: Two times a day (BID) | INTRAVENOUS | Status: DC
  Administered 2013-12-06 – 2013-12-17 (×24): 500 mg via INTRAVENOUS
  Filled 2013-12-06 (×27): qty 100

## 2013-12-06 MED ORDER — LABETALOL HCL 5 MG/ML IV SOLN
10.0000 mg | INTRAVENOUS | Status: DC | PRN
  Filled 2013-12-06: qty 8

## 2013-12-06 MED ORDER — MORPHINE SULFATE 4 MG/ML IJ SOLN
3.0000 mg | INTRAMUSCULAR | Status: DC | PRN
  Administered 2013-12-06 – 2013-12-08 (×7): 3 mg via INTRAVENOUS
  Filled 2013-12-06 (×8): qty 1

## 2013-12-06 NOTE — Progress Notes (Signed)
     TEE cancelled due to recent brain biopsy last night. Will plan on Friday if able to perform safely.   Candee Furbish, MD
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
## 2013-12-06 NOTE — Progress Notes (Signed)
Spoke with Pierce Crane in pharmacy regarding ampho B. He advised "it is still under the hood" and will be ready soon. Notified him that I adjusted the time to 2000 and that the patient had been moved to [facility].

## 2013-12-06 NOTE — Progress Notes (Signed)
Subjective: Pt tolerated brain abscess biopsy well yesterday evening. Sitting up in bed with sitter at bedside. Requesting a cigarette.   Objective: Vital signs in last 24 hours: Filed Vitals:   12/06/13 0600 12/06/13 0630 12/06/13 0700 12/06/13 0800  BP: 108/67  109/63   Pulse: 78 69 80   Temp:    97.1 F (36.2 C)  TempSrc:    Axillary  Resp: 20 18 23    Height:      Weight:      SpO2: 96% 97% 98%    Weight change:   Intake/Output Summary (Last 24 hours) at 12/06/13 1023 Last data filed at 12/06/13 0700  Gross per 24 hour  Intake   3752 ml  Output   2105 ml  Net   1647 ml   PE General: NAD, cachetic  HEENT: left pupil reactive, unable to check rt pupil- pt was squeezing eye shut Lungs: CTAB on anterior auscultation  Cardiac: RRRm, S1/S2 JO:ACZY, scaphoid abdomen Ext: unable to grip lt hand, 3/5 rt hand grip, unable wiggle toes but intact sensation. Unable to lift legs off bed b/l Neuro: AAO x 3  Lab Results: Basic Metabolic Panel:  Recent Labs Lab 12/01/13 0414  12/05/13 1010 12/06/13 0254  NA 139  < > 132* 134*  K 3.6*  < > 4.8 4.4  CL 102  < > 97 101  CO2 25  < > 19 18*  GLUCOSE 97  < > 150* 115*  BUN 11  < > 21 28*  CREATININE 0.68  < > 0.56 0.99  CALCIUM 8.5  < > 9.3 8.9  MG 2.0  --   --   --   < > = values in this interval not displayed. Liver Function Tests:  Recent Labs Lab 11/30/13 2216 12/01/13 0414  AST 82* 60*  ALT 40 34  ALKPHOS 77 73  BILITOT 0.4 0.4  PROT 8.8* 7.6  ALBUMIN 2.6* 2.4*   CBC:  Recent Labs Lab 12/05/13 0605 12/06/13 0254  WBC 10.5 10.8*  NEUTROABS 8.9* 8.5*  HGB 14.1 12.5*  HCT 42.4 38.9*  MCV 81.4 80.9  PLT 456* 445*   Cardiac Enzymes:  Recent Labs Lab 12/05/13 1010 12/05/13 1520 12/05/13 2336  TROPONINI <0.30 <0.30 <0.30   BNP:  Recent Labs Lab 11/30/13 2216  PROBNP 397.8*   CBG:  Recent Labs Lab 12/04/13 2347 12/05/13 0639 12/05/13 1124 12/05/13 1617 12/05/13 2310 12/06/13 0834    GLUCAP 132* 135* 129* 189* 156* 108*   Urine Drug Screen: Drugs of Abuse     Component Value Date/Time   LABOPIA POSITIVE* 12/01/2013 0533   LABOPIA NEG 06/01/2011 1115   COCAINSCRNUR NONE DETECTED 12/01/2013 0533   COCAINSCRNUR NEG 06/01/2011 1115   LABBENZ NONE DETECTED 12/01/2013 0533   LABBENZ NEG 06/01/2011 1115   LABBENZ NEG 02/18/2009 2050   AMPHETMU NONE DETECTED 12/01/2013 0533   AMPHETMU NEG 06/01/2011 1115   AMPHETMU NEG 02/18/2009 2050   THCU NONE DETECTED 12/01/2013 0533   THCU NEG 06/01/2011 1115   LABBARB NONE DETECTED 12/01/2013 0533   LABBARB NEG 06/01/2011 1115      Micro Results: Recent Results (from the past 240 hour(s))  Urine culture     Status: None   Collection Time: 11/30/13 11:20 PM  Result Value Ref Range Status   Specimen Description URINE, CLEAN CATCH  Final   Special Requests NONE  Final   Culture  Setup Time   Final    12/01/2013 09:01 Performed  at [place] Performed at [facility]   Final   Culture NO GROWTH Performed at [facility]   Final   Report Status 12/02/2013 FINAL  Final  Blood culture (routine x 2)     Status: None (Preliminary result)   Collection Time: 12/01/13 12:05 AM  Result Value Ref Range Status   Specimen Description BLOOD RIGHT ARM  Final   Special Requests BOTTLES DRAWN AEROBIC AND ANAEROBIC 5CC  Final   Culture  Setup Time   Final    12/01/2013 04:15 Performed at [facility]    Culture   Final           BLOOD CULTURE RECEIVED NO GROWTH TO DATE CULTURE WILL BE HELD FOR 5 DAYS BEFORE ISSUING A FINAL NEGATIVE REPORT Performed at [facility]    Report Status PENDING  Incomplete  Blood culture (routine x 2)     Status: None (Preliminary result)   Collection Time: 12/01/13 12:08 AM  Result Value Ref Range Status   Specimen Description BLOOD RIGHT HAND  Final   Special Requests BOTTLES DRAWN AEROBIC AND ANAEROBIC 5CC  Final   Culture   Setup Time   Final    12/01/2013 04:15 Performed at [facility]    Culture   Final           BLOOD CULTURE RECEIVED NO GROWTH TO DATE CULTURE WILL BE HELD FOR 5 DAYS BEFORE ISSUING A FINAL NEGATIVE REPORT Performed at [facility]    Report Status PENDING  Incomplete  Culture, expectorated sputum-assessment     Status: None   Collection Time: 12/01/13  4:33 AM  Result Value Ref Range Status   Specimen Description SPUTUM  Final   Special Requests NONE  Final   Sputum evaluation   Final    MICROSCOPIC FINDINGS SUGGEST THAT THIS SPECIMEN IS NOT REPRESENTATIVE OF LOWER RESPIRATORY SECRETIONS. PLEASE RECOLLECT. CALLED TO [place] [phone] 12/01/13 E.GADDY    Report Status 12/01/2013 FINAL  Final   Studies/Results: Ct Head Wo Contrast  12/06/2013   CLINICAL DATA:  Brain abscess status post aspiration  EXAM: CT HEAD WITHOUT CONTRAST  TECHNIQUE: Contiguous axial images were obtained from the base of the skull through the vertex without intravenous contrast.  COMPARISON:  Head CT 12/04/2013  FINDINGS: New bifrontal bur holes for surgical access. No acute osseous findings.  No mastoid or sinus opacification.  Three brain abscesses status post decompression:  *A high right frontal white matter abscess is decompressed, currently 23 mm in diameter, previously 27 mm. *An abscess centered in the right anterior putamen is also smaller, currently 28 mm in diameter, previously 44mm. There is an associated decrease in mass effect on the frontal horn of the wall right lateral ventricle and decrease in edema. *A peripheral high and posterior left frontal abscess is stable at 25 mm. This cavity contains gas, fluid, and a tiny focus of postoperative hemorrhage.  Mild decreased in vasogenic edema. Leftward shift it is currently 4 mm. No evidence of intraventricular debris or hydrocephalus. No acute infarct. Postoperative pneumocephalus.  IMPRESSION: 1. Three cerebral abscesses status post  decompression. Vasogenic edema and mass effect is improving. 2. Tiny postoperative hemorrhage in the left frontal cavity. 3. No ventriculomegaly or ventricular debris.   Electronically Signed   By: Jorje Guild M.D.   On: 12/06/2013 05:17   Ct Head Wo Contrast  12/04/2013   CLINICAL DATA:  Altered mental status.  Multiple brain lesions.  EXAM: CT HEAD WITHOUT CONTRAST  TECHNIQUE: Contiguous axial images were obtained from the base of the skull through the vertex without intravenous contrast.  COMPARISON:  MRI dated 12/01/2013 and is CT scan dated 11/30/2013  FINDINGS: There has been rapidly progression in the apparent size of brain lesions in the right basal ganglia, posterior left frontal lobe, and right frontal centrum semiovale. There is increased edema around each lesion with increased compression of frontal horn of the right lateral ventricle with new midline shift of 3.7 mm.  IMPRESSION: Rapid progression in the size of the multiple brain lesions with due right to left midline shift and increased edema surrounding the lesions. The possibility of progressive brain abscess should be considered particularly in view of the rapid progression.   Electronically Signed   By: Rozetta Nunnery M.D.   On: 12/04/2013 18:51   Ct Head W Contrast  12/05/2013   CLINICAL DATA:  53 year old male with intracranial abscesses. Stealth exam. Pre surgical. Subsequent encounter.  EXAM: CT HEAD WITH CONTRAST  TECHNIQUE: Contiguous axial images were obtained from the base of the skull through the vertex with intravenous contrast.  CONTRAST:  68mL OMNIPAQUE IOHEXOL 300 MG/ML  SOLN  COMPARISON:  12/04/2013 head CT.  12/01/2013 brain MR.  FINDINGS: Three intracranial ring-enhancing lesions suggestive of intracranial abscesses which have increased in size from the recent MR. This includes posterior left frontal - parietal lobe 3 cm ring-enhancing lesion, posterior right frontal lobe 2.7 cm enhancing lesion and right basal ganglia  3.2 cm enhancing lesion.  Marked surrounding vasogenic edema and local mass effect including compression of the right lateral ventricle and bowing of the third ventricle to left.  No evidence of acute thrombotic infarct or intracranial hemorrhage.  IMPRESSION: Three intracranial ring-enhancing lesions suggestive of intracranial abscesses which have increased in size from the recent MR. This includes posterior left frontal - parietal lobe 3 cm ring-enhancing lesion, posterior right frontal lobe 2.7 cm enhancing lesion and right basal ganglia 3.2 cm enhancing lesion.  Marked surrounding vasogenic edema and local mass effect including compression of the right lateral ventricle and bowing of the third ventricle to left.   Electronically Signed   By: Chauncey Cruel M.D.   On: 12/05/2013 11:46   Mr Cervical Spine W Wo Contrast  12/04/2013   CLINICAL DATA:  History of stroke, MRSA infection and intravenous drug use presents with fever and leg pain, multiple falls. Diagnosed with intracranial lesions on December 01, 2013 which may reflect abscess or metastatic disease.  EXAM: MRI TOTAL SPINE WITHOUT AND WITH CONTRAST  TECHNIQUE: Multisequence MR imaging of the spine from the cervical spine to the sacrum was performed prior to and following IV contrast administration for evaluation of spinal metastatic disease.  CONTRAST:  37mL MULTIHANCE GADOBENATE DIMEGLUMINE 529 MG/ML IV SOLN  COMPARISON:  CT of the chest, abdomen and pelvis December 09, 2013; MRI of the brain December 01, 2013  FINDINGS: Cervical Findings:  Cervical vertebral bodies and posterior elements appear intact and aligned with maintenance of cervical lordosis. Mild motion degraded examination. Status post C4-5 ACDF, which results in some susceptibility artifact. Mild to moderate C5-6 degenerative disc, with decreased T2 signal within all cervical disc consistent with desiccation. Mild chronic discogenic endplate changes at all cervical levels. No STIR signal  abnormality to suggest acute osseous process. Limited post gadolinium sequence due to spinal hardware,  Cervical spinal cord appears normal morphology and signal characteristics from the cervical  medullary junction to at least level of T3-4, most caudal well visualized level. Craniocervical junction maintained. 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 No convincing evidence of abnormal cord, leptomeningeal or epidural enhancement though, limited post gadolinium sagittal sequences and, due to patient's intolerance for further imaging, axial T1 post gadolinium sequence not obtained.  Level by level evaluation:  C2-3: No disc bulge, canal stenosis or neural foraminal narrowing.  C3-4: Uncovertebral hypertrophy and minimal annular bulging without canal stenosis or neural foraminal narrowing.  C4-5 ACDF.  C5-6: 3 mm RIGHT central broad-based disc protrusion. Uncovertebral hypertrophy. Moderate canal stenosis. Severe RIGHT, moderate to severe LEFT neural foraminal narrowing. There is likely a component adjacent segment disease.  C6-7: 2 mm broad-based disc bulge, mild canal stenosis. Minimal neural foraminal narrowing.  C7-T1: No disc bulge, canal stenosis nor neural foraminal narrowing.  Thoracic Findings:  Mild chronic T4 compression fracture. T2 superior endplate Schmorl's node. No acute fracture. No malalignment. Mild T6 70 T7-8 degenerative discs. Nose abnormal bone marrow signal to suggest acute osseous process. No suspicious osseous nor intradiscal enhancement.  Spinal cord appears normal morphology and signal characteristics with conus medullaris which is partially imaged at T12. No abnormal cord, leptomeningeal or upper dural enhancement. Included prevertebral and paraspinal soft tissues are nonsuspicious.  No significant disc bulge, canal stenosis or neural foraminal narrowing at any thoracic level. Moderate T8-9 facet arthropathy.  Lumbar Findings:  Lumbar vertebral bodies and  posterior elements are intact and aligned with maintenance of the lumbar lordosis. Moderate L5-S1 degenerative disc, mild desiccation L3-4 and L4-5. Mild subacute to chronic discogenic endplate changes D3-5 through L4-5. No STIR signal abnormality to suggest acute osseous process. No suspicious osseous nor intradiscal enhancement.  Conus medullaris terminates at T12-L1 and appears normal in morphology and signal characteristics. Cauda equina is unremarkable. No suspicious cord, leptomeningeal nor epidural enhancement. Included prevertebral and paraspinal soft tissues are nonsuspicious.  Level by level evaluation:  L1-2 and L2-3: No disc bulge, canal stenosis nor neural foraminal narrowing.  L3-4: 2 mm broad-based disc bulge, mild facet arthropathy and ligamentum flavum redundancy. Minimal canal stenosis. Mild bilateral neural foraminal narrowing.  L4-5: 2 mm broad-based disc bulge. Mild to moderate facet arthropathy and ligamentum flavum redundancy. Minimal canal stenosis. Moderate bilateral neural foraminal narrowing.  L5-S1: Conjoined RIGHT L5-S1 nerve root. No significant disc bulge. Moderate facet arthropathy without canal stenosis. Mild RIGHT neural foraminal narrowing.  IMPRESSION: MRI CERVICAL SPINE: Severely limited sagittal post gad. Axial T1 post gadolinium sequences not obtained due to inability to tolerate further imaging.  No suspicious MR findings to suggest metastatic disease or infection with cervical spine.  Status post C4-5 ACDF. 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  Moderate canal stenosis at C5-6, mild at C6-7. Severe RIGHT C5-6 neural foraminal narrowing.  MRI THORACIC SPINE: No MR findings to suggest metastatic disease or infection within the thoracic spine.  Mild thoracic degenerative change without neurocompressive findings.  Remote Mild T4 compression fracture.  MRI LUMBAR SPINE: No MR findings to suggest metastatic disease or  infection within the lumbar spine.  Degenerative change of the lumbar spine. Minimal canal stenosis L3-4 and L4-5.  Neural foraminal narrowing L3-4 through L5-S1: Moderate bilaterally at L4-5.  Acute findings discussed with and reconfirmed by Dr.JULIE MALLORY on 12/04/2013 at 11:33 pm.   Electronically Signed   By: Elon Alas   On: 12/04/2013 23:34   Mr Thoracic Spine W Wo Contrast  12/04/2013  CLINICAL DATA:  History of stroke, MRSA infection and intravenous drug use presents with fever and leg pain, multiple falls. Diagnosed with intracranial lesions on December 01, 2013 which may reflect abscess or metastatic disease.  EXAM: MRI TOTAL SPINE WITHOUT AND WITH CONTRAST  TECHNIQUE: Multisequence MR imaging of the spine from the cervical spine to the sacrum was performed prior to and following IV contrast administration for evaluation of spinal metastatic disease.  CONTRAST:  4mL MULTIHANCE GADOBENATE DIMEGLUMINE 529 MG/ML IV SOLN  COMPARISON:  CT of the chest, abdomen and pelvis December 09, 2013; MRI of the brain December 01, 2013  FINDINGS: Cervical Findings:  Cervical vertebral bodies and posterior elements appear intact and aligned with maintenance of cervical lordosis. Mild motion degraded examination. Status post C4-5 ACDF, which results in some susceptibility artifact. Mild to moderate C5-6 degenerative disc, with decreased T2 signal within all cervical disc consistent with desiccation. Mild chronic discogenic endplate changes at all cervical levels. No STIR signal abnormality to suggest acute osseous process. Limited post gadolinium sequence due to spinal hardware,  Cervical spinal cord appears normal morphology and signal characteristics from the cervical medullary junction to at least level of T3-4, most caudal well visualized level. Craniocervical junction maintained. 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 No convincing evidence of abnormal cord, leptomeningeal  or epidural enhancement though, limited post gadolinium sagittal sequences and, due to patient's intolerance for further imaging, axial T1 post gadolinium sequence not obtained.  Level by level evaluation:  C2-3: No disc bulge, canal stenosis or neural foraminal narrowing.  C3-4: Uncovertebral hypertrophy and minimal annular bulging without canal stenosis or neural foraminal narrowing.  C4-5 ACDF.  C5-6: 3 mm RIGHT central broad-based disc protrusion. Uncovertebral hypertrophy. Moderate canal stenosis. Severe RIGHT, moderate to severe LEFT neural foraminal narrowing. There is likely a component adjacent segment disease.  C6-7: 2 mm broad-based disc bulge, mild canal stenosis. Minimal neural foraminal narrowing.  C7-T1: No disc bulge, canal stenosis nor neural foraminal narrowing.  Thoracic Findings:  Mild chronic T4 compression fracture. T2 superior endplate Schmorl's node. No acute fracture. No malalignment. Mild T6 70 T7-8 degenerative discs. Nose abnormal bone marrow signal to suggest acute osseous process. No suspicious osseous nor intradiscal enhancement.  Spinal cord appears normal morphology and signal characteristics with conus medullaris which is partially imaged at T12. No abnormal cord, leptomeningeal or upper dural enhancement. Included prevertebral and paraspinal soft tissues are nonsuspicious.  No significant disc bulge, canal stenosis or neural foraminal narrowing at any thoracic level. Moderate T8-9 facet arthropathy.  Lumbar Findings:  Lumbar vertebral bodies and posterior elements are intact and aligned with maintenance of the lumbar lordosis. Moderate L5-S1 degenerative disc, mild desiccation L3-4 and L4-5. Mild subacute to chronic discogenic endplate changes V7-8 through L4-5. No STIR signal abnormality to suggest acute osseous process. No suspicious osseous nor intradiscal enhancement.  Conus medullaris terminates at T12-L1 and appears normal in morphology and signal characteristics. Cauda  equina is unremarkable. No suspicious cord, leptomeningeal nor epidural enhancement. Included prevertebral and paraspinal soft tissues are nonsuspicious.  Level by level evaluation:  L1-2 and L2-3: No disc bulge, canal stenosis nor neural foraminal narrowing.  L3-4: 2 mm broad-based disc bulge, mild facet arthropathy and ligamentum flavum redundancy. Minimal canal stenosis. Mild bilateral neural foraminal narrowing.  L4-5: 2 mm broad-based disc bulge. Mild to moderate facet arthropathy and ligamentum flavum redundancy. Minimal canal stenosis. Moderate bilateral neural foraminal narrowing.  L5-S1: Conjoined RIGHT L5-S1 nerve root. No significant disc bulge.  Moderate facet arthropathy without canal stenosis. Mild RIGHT neural foraminal narrowing.  IMPRESSION: MRI CERVICAL SPINE: Severely limited sagittal post gad. Axial T1 post gadolinium sequences not obtained due to inability to tolerate further imaging.  No suspicious MR findings to suggest metastatic disease or infection with cervical spine.  Status post C4-5 ACDF. 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  Moderate canal stenosis at C5-6, mild at C6-7. Severe RIGHT C5-6 neural foraminal narrowing.  MRI THORACIC SPINE: No MR findings to suggest metastatic disease or infection within the thoracic spine.  Mild thoracic degenerative change without neurocompressive findings.  Remote Mild T4 compression fracture.  MRI LUMBAR SPINE: No MR findings to suggest metastatic disease or infection within the lumbar spine.  Degenerative change of the lumbar spine. Minimal canal stenosis L3-4 and L4-5.  Neural foraminal narrowing L3-4 through L5-S1: Moderate bilaterally at L4-5.  Acute findings discussed with and reconfirmed by Dr.JULIE MALLORY on 12/04/2013 at 11:33 pm.   Electronically Signed   By: Elon Alas   On: 12/04/2013 23:34   Mr Lumbar Spine W Wo Contrast  12/04/2013   CLINICAL DATA:  History of stroke,  MRSA infection and intravenous drug use presents with fever and leg pain, multiple falls. Diagnosed with intracranial lesions on December 01, 2013 which may reflect abscess or metastatic disease.  EXAM: MRI TOTAL SPINE WITHOUT AND WITH CONTRAST  TECHNIQUE: Multisequence MR imaging of the spine from the cervical spine to the sacrum was performed prior to and following IV contrast administration for evaluation of spinal metastatic disease.  CONTRAST:  22mL MULTIHANCE GADOBENATE DIMEGLUMINE 529 MG/ML IV SOLN  COMPARISON:  CT of the chest, abdomen and pelvis December 09, 2013; MRI of the brain December 01, 2013  FINDINGS: Cervical Findings:  Cervical vertebral bodies and posterior elements appear intact and aligned with maintenance of cervical lordosis. Mild motion degraded examination. Status post C4-5 ACDF, which results in some susceptibility artifact. Mild to moderate C5-6 degenerative disc, with decreased T2 signal within all cervical disc consistent with desiccation. Mild chronic discogenic endplate changes at all cervical levels. No STIR signal abnormality to suggest acute osseous process. Limited post gadolinium sequence due to spinal hardware,  Cervical spinal cord appears normal morphology and signal characteristics from the cervical medullary junction to at least level of T3-4, most caudal well visualized level. Craniocervical junction maintained. 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 No convincing evidence of abnormal cord, leptomeningeal or epidural enhancement though, limited post gadolinium sagittal sequences and, due to patient's intolerance for further imaging, axial T1 post gadolinium sequence not obtained.  Level by level evaluation:  C2-3: No disc bulge, canal stenosis or neural foraminal narrowing.  C3-4: Uncovertebral hypertrophy and minimal annular bulging without canal stenosis or neural foraminal narrowing.  C4-5 ACDF.  C5-6: 3 mm RIGHT central broad-based disc  protrusion. Uncovertebral hypertrophy. Moderate canal stenosis. Severe RIGHT, moderate to severe LEFT neural foraminal narrowing. There is likely a component adjacent segment disease.  C6-7: 2 mm broad-based disc bulge, mild canal stenosis. Minimal neural foraminal narrowing.  C7-T1: No disc bulge, canal stenosis nor neural foraminal narrowing.  Thoracic Findings:  Mild chronic T4 compression fracture. T2 superior endplate Schmorl's node. No acute fracture. No malalignment. Mild T6 70 T7-8 degenerative discs. Nose abnormal bone marrow signal to suggest acute osseous process. No suspicious osseous nor intradiscal enhancement.  Spinal cord appears normal morphology and signal characteristics with conus medullaris which is partially imaged at T12. No  abnormal cord, leptomeningeal or upper dural enhancement. Included prevertebral and paraspinal soft tissues are nonsuspicious.  No significant disc bulge, canal stenosis or neural foraminal narrowing at any thoracic level. Moderate T8-9 facet arthropathy.  Lumbar Findings:  Lumbar vertebral bodies and posterior elements are intact and aligned with maintenance of the lumbar lordosis. Moderate L5-S1 degenerative disc, mild desiccation L3-4 and L4-5. Mild subacute to chronic discogenic endplate changes K4-4 through L4-5. No STIR signal abnormality to suggest acute osseous process. No suspicious osseous nor intradiscal enhancement.  Conus medullaris terminates at T12-L1 and appears normal in morphology and signal characteristics. Cauda equina is unremarkable. No suspicious cord, leptomeningeal nor epidural enhancement. Included prevertebral and paraspinal soft tissues are nonsuspicious.  Level by level evaluation:  L1-2 and L2-3: No disc bulge, canal stenosis nor neural foraminal narrowing.  L3-4: 2 mm broad-based disc bulge, mild facet arthropathy and ligamentum flavum redundancy. Minimal canal stenosis. Mild bilateral neural foraminal narrowing.  L4-5: 2 mm broad-based disc  bulge. Mild to moderate facet arthropathy and ligamentum flavum redundancy. Minimal canal stenosis. Moderate bilateral neural foraminal narrowing.  L5-S1: Conjoined RIGHT L5-S1 nerve root. No significant disc bulge. Moderate facet arthropathy without canal stenosis. Mild RIGHT neural foraminal narrowing.  IMPRESSION: MRI CERVICAL SPINE: Severely limited sagittal post gad. Axial T1 post gadolinium sequences not obtained due to inability to tolerate further imaging.  No suspicious MR findings to suggest metastatic disease or infection with cervical spine.  Status post C4-5 ACDF. 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  Moderate canal stenosis at C5-6, mild at C6-7. Severe RIGHT C5-6 neural foraminal narrowing.  MRI THORACIC SPINE: No MR findings to suggest metastatic disease or infection within the thoracic spine.  Mild thoracic degenerative change without neurocompressive findings.  Remote Mild T4 compression fracture.  MRI LUMBAR SPINE: No MR findings to suggest metastatic disease or infection within the lumbar spine.  Degenerative change of the lumbar spine. Minimal canal stenosis L3-4 and L4-5.  Neural foraminal narrowing L3-4 through L5-S1: Moderate bilaterally at L4-5.  Acute findings discussed with and reconfirmed by Dr.JULIE MALLORY on 12/04/2013 at 11:33 pm.   Electronically Signed   By: Elon Alas   On: 12/04/2013 23:34   Medications: I have reviewed the patient's current medications. Scheduled Meds: . amphotericin  B  Liposome (AMBISOME) ADULT IV  5 mg/kg Intravenous Q24H  . cefTRIAXone (ROCEPHIN)  IV  2 g Intravenous Q12H  . dexamethasone  4 mg Intravenous 4 times per day  . famotidine  10 mg Oral BID  . feeding supplement (GLUCERNA SHAKE)  237 mL Oral BID BM  . fenofibrate  54 mg Oral Daily  . Influenza vac split quadrivalent PF  0.5 mL Intramuscular Tomorrow-1000  . levETIRAcetam  500 mg Intravenous Q12H  . metronidazole  500  mg Intravenous Q6H  . mometasone-formoterol  2 puff Inhalation BID  . multivitamin with minerals  1 tablet Oral Daily  . neomycin-bacitracin-polymyxin   Topical BID  . nicotine  21 mg Transdermal Q24H  . pantoprazole (PROTONIX) IV  40 mg Intravenous QHS  . polyethylene glycol  17 g Oral BID  . vancomycin  1,000 mg Intravenous Q8H   Continuous Infusions: . sodium chloride 20 mL/hr at 12/03/13 0805  . sodium chloride 20 mL/hr at 12/06/13 0400   PRN Meds:.acetaminophen, albuterol, clonazePAM, diclofenac sodium, hydrocortisone cream, hydrOXYzine, ipratropium-albuterol, labetalol, morphine injection, ondansetron **OR** ondansetron (ZOFRAN) IV, promethazine Assessment/Plan: Principal Problem:   Brain mass Active Problems:   Dyslipidemia  Anxiety state   HYPERTENSION, BENIGN ESSENTIAL   Peripheral vascular disease   COPD (chronic obstructive pulmonary disease)   Tobacco abuse   Abnormality of gait   Loss of weight   Malnutrition   Pneumonia   Heroin abuse   Protein-calorie malnutrition, severe   Malignancy   Rectal mass   Abnormal CT scan, pelvis   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  - Decadron 4mg  q6h.  - TEE ordered 2/2 IVDU on Friday - blood cultures NGTD - Neuro checks - Dr. Baxter Flattery w/ ID following, recommend to start vanc, flagyl, and rocephin; day 4. D/c amp B as no yeast were seen on gram stain from biopsy last night. - 1 day s/p brain abscess biopsy by Dr. Ellene Route, tolerated procedure well. awaiting cultures. - transfer to tele  Hyponatremia-- 2/2 cerebral salt wasting vs SIADH.  Na 134 today - will continue to monitor  Dysphagia-  - SLP- pt failed barium study today, NPO, recommend temp alternative means -  will place NG tube w/  clear liquid diet per tube  Metastatic disease with cavitary Pneumonia: Was treated for PNA in 09/2013 with azithromycin, then had infiltrative finding on CXR in 10/2013. Has persistent infiltrate in RML suggestive of pneumonia. Has remote history of TB. - abx as noted above  - Sputum culture needs to be recollected.   Lower Extremity pain now w/ new weakness: Dopplers negative for DVT. Able to move legs this morning - MRI of cervical, thoracic, and lumbar spine w/ and w/o contrast neg for malignancy, minimal disc bulging noted along with chronic T4 compression fracture - morphine to q3h, no longer complaining of pain or requesting additional pain meds - will cont to monitor  Suicidal comments- made threat to harm himself with plan of using gun - psych saw patient and do not recommend acute psych hospitalization or further med management - sitter w/ pt  Fever: 101.3 deg F on admission, afebrile overnight - Tylenol for fever and pain - Sputum culture needs to be collected - blood culture  NGTD  Polysubstance Abuse: 50 pack year smoker who started using IV heroin about a year ago when his pain medications were not renewed. - nicotine patch - Morphine 2mg  q3h, can increase to q2h if needed. Currently no in withdrawal - concern for wife possibly giving pt drugs;sons state it is a possibility she may slip drugs. Sitter in the room w/ pt - palliative care consulted for multiple medical conditions and methadone for pain  COPD: Progressive emphysema; smoking history of 50 pack years. Had recent PFTs. Uses combivent and advair. - Continue home medications  Anxiety: May be contributing to his dyspnea, as per prior clinic notes. Has taken klonopin in the past; no longer written for klonopin by IMTS. Obtained new rx from a psychiatrist affiliated with his therapist. - Continue klonopin 0.5mg  BID   DMII: Stable and well-controlled on metformin. Hold metformin can start sliding scale sensitive if  CBGs elevated - continue CBG checks q4  Essential Hypertension: normotensive. Will continue to monitor. Did not see any BP home meds listed.  DVT Ppx:  - SCDs  Diet:  - clear liquids per NG tube  Dispo: Disposition is deferred at this time, awaiting improvement of current medical problems. Anticipated discharge in approximately 5  day(s).   The patient does have a current PCP (Kelby Aline, MD) and does need an [facility] hospital follow-up appointment after discharge.  The patient does have transportation limitations that hinder transportation to clinic appointments.  .Services Needed at time of discharge: Y = Yes, Blank = No PT:   OT: SNF  RN:   Equipment:   Other:     LOS: 6 days   Julious Oka, MD 12/06/2013, 10:23 AM

## 2013-12-06 NOTE — Progress Notes (Signed)
ANTIBIOTIC CONSULT NOTE - FOLLOW UP  Pharmacy Consult for Vancomycin Indication: Brain abscesses  Allergies  Allergen Reactions  . Fish Allergy Hives, Swelling and Rash  . Buprenorphine Hcl-Naloxone Hcl Other (See Comments)    "feel like going to die" jittery, trouble breathing, feels hot  . Shellfish Allergy Hives  . Benadryl [Diphenhydramine Hcl] Hives, Itching and Rash    Patient Measurements: Height: 6\' 1"  (185.4 cm) Weight: 128 lb 3.2 oz (58.151 kg) IBW/kg (Calculated) : 79.9  Vital Signs: Temp: 97.8 F (36.6 C) (11/25 1200) Temp Source: Oral (11/25 1200) BP: 126/80 mmHg (11/25 1300) Pulse Rate: 93 (11/25 1300) Intake/Output from previous day: 11/24 0701 - 11/25 0700 In: 3752 [I.V.:1802; IV Piggyback:1950] Out: 2105 [Urine:2100; Blood:5] Intake/Output from this shift: Total I/O In: 420 [I.V.:70; IV Piggyback:350] Out: 245 [Urine:245]  Labs:  Recent Labs  12/05/13 0605 12/05/13 1010 12/06/13 0254  WBC 10.5  --  10.8*  HGB 14.1  --  12.5*  PLT 456*  --  445*  CREATININE 0.58 0.56 0.99   Estimated Creatinine Clearance: 69.4 mL/min (by C-G formula based on Cr of 0.99).  Recent Labs  12/03/13 2055  [place] 14.0     Microbiology: Recent Results (from the past 720 hour(s))  Urine culture     Status: None   Collection Time: 11/30/13 11:20 PM  Result Value Ref Range Status   Specimen Description URINE, CLEAN CATCH  Final   Special Requests NONE  Final   Culture  Setup Time   Final    12/01/2013 09:01 Performed at [place] Performed at [facility]   Final   Culture NO GROWTH Performed at [facility]   Final   Report Status 12/02/2013 FINAL  Final  Blood culture (routine x 2)     Status: None (Preliminary result)   Collection Time: 12/01/13 12:05 AM  Result Value Ref Range Status   Specimen Description BLOOD RIGHT ARM  Final   Special Requests BOTTLES DRAWN AEROBIC AND ANAEROBIC 5CC   Final   Culture  Setup Time   Final    12/01/2013 04:15 Performed at [facility]    Culture   Final           BLOOD CULTURE RECEIVED NO GROWTH TO DATE CULTURE WILL BE HELD FOR 5 DAYS BEFORE ISSUING A FINAL NEGATIVE REPORT Performed at [facility]    Report Status PENDING  Incomplete  Blood culture (routine x 2)     Status: None (Preliminary result)   Collection Time: 12/01/13 12:08 AM  Result Value Ref Range Status   Specimen Description BLOOD RIGHT HAND  Final   Special Requests BOTTLES DRAWN AEROBIC AND ANAEROBIC 5CC  Final   Culture  Setup Time   Final    12/01/2013 04:15 Performed at [facility]    Culture   Final           BLOOD CULTURE RECEIVED NO GROWTH TO DATE CULTURE WILL BE HELD FOR 5 DAYS BEFORE ISSUING A FINAL NEGATIVE REPORT Performed at [facility]    Report Status PENDING  Incomplete  Culture, expectorated sputum-assessment     Status: None   Collection Time: 12/01/13  4:33 AM  Result Value Ref Range Status   Specimen Description SPUTUM  Final   Special Requests NONE  Final   Sputum evaluation   Final    MICROSCOPIC FINDINGS SUGGEST THAT THIS SPECIMEN IS NOT REPRESENTATIVE OF  LOWER RESPIRATORY SECRETIONS. PLEASE RECOLLECT. CALLED TO [place] [phone] 12/01/13 E.GADDY    Report Status 12/01/2013 FINAL  Final  Anaerobic culture     Status: None (Preliminary result)   Collection Time: 12/05/13 10:31 PM  Result Value Ref Range Status   Specimen Description ABSCESS  Final   Special Requests   Final    BILATERAL ASPIRATION OF BRAIN ABSCESS SPECIMEN NO 1   Gram Stain   Final    MODERATE WBC PRESENT, PREDOMINANTLY PMN NO SQUAMOUS EPITHELIAL CELLS SEEN MODERATE GRAM POSITIVE COCCI IN PAIRS Performed at [facility]    Culture PENDING  Incomplete   Report Status PENDING  Incomplete  Culture, routine-abscess     Status: None (Preliminary result)   Collection Time: 12/05/13 10:31 PM  Result Value Ref Range Status    Specimen Description ABSCESS  Final   Special Requests   Final    BILATERAL ASPIRATION OF BRAIN ABSCESS SPECIMEN NO 1   Gram Stain   Final    MODERATE WBC PRESENT, PREDOMINANTLY PMN NO SQUAMOUS EPITHELIAL CELLS SEEN MODERATE GRAM POSITIVE COCCI IN PAIRS Performed at [facility]    Culture PENDING  Incomplete   Report Status PENDING  Incomplete  Anaerobic culture     Status: None (Preliminary result)   Collection Time: 12/05/13 10:37 PM  Result Value Ref Range Status   Specimen Description ABSCESS  Final   Special Requests   Final    BILATERAL ASPIRATION OF BRAIN ABSCESS SPECIMEN NO 2   Gram Stain   Final    ABUNDANT WBC PRESENT, PREDOMINANTLY PMN NO SQUAMOUS EPITHELIAL CELLS SEEN MODERATE GRAM POSITIVE COCCI IN PAIRS Performed at [facility]    Culture PENDING  Incomplete   Report Status PENDING  Incomplete  Culture, routine-abscess     Status: None (Preliminary result)   Collection Time: 12/05/13 10:37 PM  Result Value Ref Range Status   Specimen Description ABSCESS  Final   Special Requests   Final    BILATERAL ASPIRATION OF BRAIN ABSCESS SPECIMEN NO 2   Gram Stain   Final    ABUNDANT WBC PRESENT, PREDOMINANTLY PMN NO SQUAMOUS EPITHELIAL CELLS SEEN MODERATE GRAM POSITIVE COCCI IN PAIRS Performed at [facility]    Culture PENDING  Incomplete   Report Status PENDING  Incomplete  Culture, routine-abscess     Status: None (Preliminary result)   Collection Time: 12/05/13 10:37 PM  Result Value Ref Range Status   Specimen Description ABSCESS  Final   Special Requests   Final    BILATERAL ASPIRATION OF BRAIN ABSCESS SPECIMEN NO 3   Gram Stain   Final    ABUNDANT WBC PRESENT, PREDOMINANTLY PMN NO SQUAMOUS EPITHELIAL CELLS SEEN MODERATE GRAM POSITIVE COCCI IN PAIRS Performed at [facility]    Culture PENDING  Incomplete   Report Status PENDING  Incomplete  Anaerobic culture     Status: None (Preliminary result)   Collection Time:  12/05/13 10:37 PM  Result Value Ref Range Status   Specimen Description ABSCESS  Final   Special Requests BILATERAL ASPIRATION OF BRAIN ABSCESS  Final   Gram Stain   Final    ABUNDANT WBC PRESENT, PREDOMINANTLY PMN NO SQUAMOUS EPITHELIAL CELLS SEEN MODERATE GRAM POSITIVE COCCI IN PAIRS Performed at [facility]    Culture PENDING  Incomplete   Report Status PENDING  Incomplete    Anti-infectives    Start     Dose/Rate Route Frequency Ordered Stop  12/05/13 1800  metroNIDAZOLE (FLAGYL) IVPB 500 mg     500 mg100 mL/hr over 60 Minutes Intravenous Every 6 hours 12/05/13 1430     12/05/13 1000  amphotericin B liposome (AMBISOME) 310 mg in dextrose 5 % 500 mL IVPB     5 mg/kg  62.2 kg250 mL/hr over 120 Minutes Intravenous Every 24 hours 12/05/13 0942     12/04/13 0600  vancomycin (VANCOCIN) IVPB 1000 mg/200 mL premix     1,000 mg200 mL/hr over 60 Minutes Intravenous Every 8 hours 12/03/13 2208     12/02/13 2200  vancomycin (VANCOCIN) IVPB 750 mg/150 ml premix  Status:  Discontinued     750 mg150 mL/hr over 60 Minutes Intravenous Every 8 hours 12/02/13 1534 12/03/13 2208   12/02/13 1400  metroNIDAZOLE (FLAGYL) IVPB 500 mg  Status:  Discontinued     500 mg100 mL/hr over 60 Minutes Intravenous Every 6 hours 12/02/13 0755 12/05/13 1430   12/02/13 0930  vancomycin (VANCOCIN) IVPB 750 mg/150 ml premix  Status:  Discontinued     750 mg150 mL/hr over 60 Minutes Intravenous Every 8 hours 12/02/13 0835 12/02/13 1534   12/02/13 0900  cefTRIAXone (ROCEPHIN) 2 g in dextrose 5 % 50 mL IVPB - Premix     2 g100 mL/hr over 30 Minutes Intravenous Every 12 hours 12/02/13 0835     12/02/13 0800  cefTRIAXone (ROCEPHIN) 1 g in dextrose 5 % 50 mL IVPB - Premix  Status:  Discontinued     1 g100 mL/hr over 30 Minutes Intravenous Every 24 hours 12/02/13 0752 12/02/13 0835   12/02/13 0800  metroNIDAZOLE (FLAGYL) IVPB 1 g     1 g200 mL/hr over 60 Minutes Intravenous  Once 12/02/13 0752 12/02/13 1109    12/01/13 0600  ceFEPIme (MAXIPIME) 1 g in dextrose 5 % 50 mL IVPB  Status:  Discontinued     1 g100 mL/hr over 30 Minutes Intravenous 3 times per day 12/01/13 0301 12/01/13 1039   12/01/13 0315  vancomycin (VANCOCIN) IVPB 1000 mg/200 mL premix  Status:  Discontinued     1,000 mg200 mL/hr over 60 Minutes Intravenous Every 12 hours 12/01/13 0301 12/01/13 1039   11/30/13 2345  vancomycin (VANCOCIN) IVPB 1000 mg/200 mL premix  Status:  Discontinued     1,000 mg200 mL/hr over 60 Minutes Intravenous  Once 11/30/13 2344 12/01/13 1039   11/30/13 2345  ceFEPIme (MAXIPIME) 2 g in dextrose 5 % 50 mL IVPB     2 g100 mL/hr over 30 Minutes Intravenous  Once 11/30/13 2344 12/01/13 0109   11/30/13 2330  cefTRIAXone (ROCEPHIN) 1 g in dextrose 5 % 50 mL IVPB  Status:  Discontinued     1 g100 mL/hr over 30 Minutes Intravenous  Once 11/30/13 2329 11/30/13 2331   11/30/13 2330  azithromycin (ZITHROMAX) 500 mg in dextrose 5 % 250 mL IVPB  Status:  Discontinued     500 mg250 mL/hr over 60 Minutes Intravenous  Once 11/30/13 2329 11/30/13 2331     Assessment: 53 YOM admitted 11/30/2013  With LE weakness, pain & falls. Daily IVDA. 50# wt loss in last year. Found to have brain abcess. 11/24 brain abcess bx Pharmacy consulted to dose Vancomycin, also on ceftriaxone and flagyl.    Anticoagulation- Enox for VTE px  Infectious Disease: brain abscess/cavitary PNA. 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 ID on board,neurosurgery for brain biopsy 11/24 , Unclear if radiotactic sampling done (not showing up with cultures).fungal coverage added for incr abscesses/mass  effect. afeb, WBC 10.8 11/24 Brain biopsy culture> [facility] 11/19 UCx >> NG 11/20 BCx >> ngtd    Cefepime 11/20 x 1 Vanc 11/20; 11/21 >> Metronidazole 11/21 >> Ceftriaxone 11/21 >> Ampho B 11/24>>  11/22 VT 14 on 750 q8h - incr 1 q8h 11/25 VT  Nephrology- CrCl ~70 ml/min, good UOP  Goal of Therapy:  Vancomycin trough level 15-20 mcg/ml  Plan:  Vancomycin to 1g  IV every 8 hours Follow up VT prior to next dose Follow up SCr, UOP, cultures, clinical course and adjust as clinically indicated.   Thank you for allowing pharmacy to be a part of this patients care team.  Rowe Robert Pharm.D., BCPS, AQ-Cardiology Clinical Pharmacist 12/06/2013 1:20 PM Pager: [phone] Phone: [phone]

## 2013-12-06 NOTE — Progress Notes (Signed)
eLink Physician-Brief Progress Note Patient Name: [name] DOB: 1960-12-26 MRN: [id number]   Date of Service  12/06/2013  HPI/Events of Note  70 M IVDU s/p stereotactic drainage of 3 brain abscesses.  On broad spectrum ABX.  Not vented, HD stable.  eICU Interventions  Plan: Continue to monitor No need for [facility] intervention at this point Primary team plan of care.     Intervention Category Evaluation Type: New Patient Evaluation  [name] 12/06/2013, 2:19 AM

## 2013-12-06 NOTE — Anesthesia Postprocedure Evaluation (Signed)
  Anesthesia Post-op Note  Patient: [name]  Procedure(s) Performed: Procedure(s): Bilateral Aspiration of Brain Abscess (Bilateral)  Patient Location: PACU  Anesthesia Type:General  Level of Consciousness: awake, sedated and confused  Airway and Oxygen Therapy: Patient Spontanous Breathing  Post-op Pain: mild  Post-op Assessment: Post-op Vital signs reviewed  Post-op Vital Signs: stable  Last Vitals:  Filed Vitals:   12/06/13 0000  BP: 125/67  Pulse: 112  Temp:   Resp: 28    Complications: No apparent anesthesia complications

## 2013-12-06 NOTE — Progress Notes (Signed)
[facility]    Date of Admission:  11/30/2013   Total days of antibiotics 5        Day 5 vanco        Day 5 ceftriaxone        Day 5 metronidazole   ID: [name] is a 53 y.o. male with brain abscesses with worsening neurologic function in the last 24-48hr Principal Problem:   Brain mass Active Problems:   Dyslipidemia   Anxiety state   HYPERTENSION, BENIGN ESSENTIAL   Peripheral vascular disease   COPD (chronic obstructive pulmonary disease)   Tobacco abuse   Abnormality of gait   Loss of weight   Malnutrition   Pneumonia   Heroin abuse   Protein-calorie malnutrition, severe   Malignancy   Rectal mass   Abnormal CT scan, pelvis    Subjective: Afebrile. Patient underwent emergent surgical management of brain abscesses with radiotactic aspiration of multiple brain abscess. Specimens sent to culture. He is in ICU post surgery, where he is more alert, still has very little movement to left side. " i want to live". Discuss at bedside with him, his jon, [name], and palliative care discussing that his condition is still life threatening, needs 8 wk of IV antibiotics. Needs to be coorperative, and primary team will address opiate needs/dependence/ watch for withdrawal.  Patient appears very concerned in finding his girlfriend, [name], (both previously using heroin and homeless)  Medications:  . amphotericin  B  Liposome (AMBISOME) ADULT IV  5 mg/kg Intravenous Q24H  . cefTRIAXone (ROCEPHIN)  IV  2 g Intravenous Q12H  . dexamethasone  4 mg Intravenous 4 times per day  . famotidine  10 mg Oral BID  . feeding supplement (GLUCERNA SHAKE)  237 mL Oral BID BM  . fenofibrate  54 mg Oral Daily  . Influenza vac split quadrivalent PF  0.5 mL Intramuscular Tomorrow-1000  . levETIRAcetam  500 mg Intravenous Q12H  . metronidazole  500 mg Intravenous Q6H  . mometasone-formoterol  2 puff Inhalation BID  . multivitamin with minerals  1 tablet Oral Daily  .  neomycin-bacitracin-polymyxin   Topical BID  . nicotine  21 mg Transdermal Q24H  . pantoprazole (PROTONIX) IV  40 mg Intravenous QHS  . polyethylene glycol  17 g Oral BID  . vancomycin  1,000 mg Intravenous Q8H    Objective: Vital signs in last 24 hours: Temp:  [97.1 F (36.2 C)-99.7 F (37.6 C)] 97.8 F (36.6 C) (11/25 1200) Pulse Rate:  [69-112] 87 (11/25 1400) Resp:  [18-29] 24 (11/25 1400) BP: (91-137)/(52-81) 108/61 mmHg (11/25 1400) SpO2:  [92 %-99 %] 94 % (11/25 1400) Weight:  [128 lb 3.2 oz (58.151 kg)] 128 lb 3.2 oz (58.151 kg) (11/25 0200) Physical Exam  Constitutional: He is oriented to person, place, sedated. Appears weak, emaciated chronically ill. Speech more comprehensible but still slurred.No distress.  HENT: gaunt, bitemporal wasting, dry oral pharynx when he sticks out his tongue in following direction. Tongue is midline, no fasciculations Mouth/Throat: Oropharynx is clear. No oropharyngeal exudate.  Cardiovascular: Normal rate, regular rhythm and normal heart sounds. Exam reveals no gallop and no friction rub.  No murmur heard.  Pulmonary/Chest: Effort normal and breath sounds normal. No respiratory distress. He has no wheezes.  Abdominal: Soft. Bowel sounds are decreased. He exhibits no distension. There is no tenderness.  Lymphadenopathy:  He has no cervical adenopathy.  Neurological: has 3/5 grip strength in the right hand, 0/5 in  the left hand. Unable to lift arm above lap Skin: Skin is warm and dry. No rash noted. No erythema.     Lab Results  Recent Labs  12/05/13 0605 12/05/13 1010 12/06/13 0254  WBC 10.5  --  10.8*  HGB 14.1  --  12.5*  HCT 42.4  --  38.9*  NA 128* 132* 134*  K 4.7 4.8 4.4  CL 94* 97 101  CO2 17* 19 18*  BUN 20 21 28*  CREATININE 0.58 0.56 0.99    Microbiology: 11/24 brain abscess = gram stain shows gpc pr as discussed with micro lab Studies/Results: Ct Head Wo Contrast  12/06/2013   CLINICAL DATA:  Brain abscess  status post aspiration  EXAM: CT HEAD WITHOUT CONTRAST  TECHNIQUE: Contiguous axial images were obtained from the base of the skull through the vertex without intravenous contrast.  COMPARISON:  Head CT 12/04/2013  FINDINGS: New bifrontal bur holes for surgical access. No acute osseous findings.  No mastoid or sinus opacification.  Three brain abscesses status post decompression:  *A high right frontal white matter abscess is decompressed, currently 23 mm in diameter, previously 27 mm. *An abscess centered in the right anterior putamen is also smaller, currently 28 mm in diameter, previously 8mm. There is an associated decrease in mass effect on the frontal horn of the wall right lateral ventricle and decrease in edema. *A peripheral high and posterior left frontal abscess is stable at 25 mm. This cavity contains gas, fluid, and a tiny focus of postoperative hemorrhage.  Mild decreased in vasogenic edema. Leftward shift it is currently 4 mm. No evidence of intraventricular debris or hydrocephalus. No acute infarct. Postoperative pneumocephalus.  IMPRESSION: 1. Three cerebral abscesses status post decompression. Vasogenic edema and mass effect is improving. 2. Tiny postoperative hemorrhage in the left frontal cavity. 3. No ventriculomegaly or ventricular debris.   Electronically Signed   By: Jorje Guild M.D.   On: 12/06/2013 05:17   Ct Head Wo Contrast  12/04/2013   CLINICAL DATA:  Altered mental status.  Multiple brain lesions.  EXAM: CT HEAD WITHOUT CONTRAST  TECHNIQUE: Contiguous axial images were obtained from the base of the skull through the vertex without intravenous contrast.  COMPARISON:  MRI dated 12/01/2013 and is CT scan dated 11/30/2013  FINDINGS: There has been rapidly progression in the apparent size of brain lesions in the right basal ganglia, posterior left frontal lobe, and right frontal centrum semiovale. There is increased edema around each lesion with increased compression of frontal  horn of the right lateral ventricle with new midline shift of 3.7 mm.  IMPRESSION: Rapid progression in the size of the multiple brain lesions with due right to left midline shift and increased edema surrounding the lesions. The possibility of progressive brain abscess should be considered particularly in view of the rapid progression.   Electronically Signed   By: Rozetta Nunnery M.D.   On: 12/04/2013 18:51   Ct Head W Contrast  12/05/2013   CLINICAL DATA:  53 year old male with intracranial abscesses. Stealth exam. Pre surgical. Subsequent encounter.  EXAM: CT HEAD WITH CONTRAST  TECHNIQUE: Contiguous axial images were obtained from the base of the skull through the vertex with intravenous contrast.  CONTRAST:  75mL OMNIPAQUE IOHEXOL 300 MG/ML  SOLN  COMPARISON:  12/04/2013 head CT.  12/01/2013 brain MR.  FINDINGS: Three intracranial ring-enhancing lesions suggestive of intracranial abscesses which have increased in size from the recent MR. This includes posterior left frontal - parietal lobe 3  cm ring-enhancing lesion, posterior right frontal lobe 2.7 cm enhancing lesion and right basal ganglia 3.2 cm enhancing lesion.  Marked surrounding vasogenic edema and local mass effect including compression of the right lateral ventricle and bowing of the third ventricle to left.  No evidence of acute thrombotic infarct or intracranial hemorrhage.  IMPRESSION: Three intracranial ring-enhancing lesions suggestive of intracranial abscesses which have increased in size from the recent MR. This includes posterior left frontal - parietal lobe 3 cm ring-enhancing lesion, posterior right frontal lobe 2.7 cm enhancing lesion and right basal ganglia 3.2 cm enhancing lesion.  Marked surrounding vasogenic edema and local mass effect including compression of the right lateral ventricle and bowing of the third ventricle to left.   Electronically Signed   By: Chauncey Cruel M.D.   On: 12/05/2013 11:46   Mr Cervical Spine W Wo  Contrast  12/04/2013   CLINICAL DATA:  History of stroke, MRSA infection and intravenous drug use presents with fever and leg pain, multiple falls. Diagnosed with intracranial lesions on December 01, 2013 which may reflect abscess or metastatic disease.  EXAM: MRI TOTAL SPINE WITHOUT AND WITH CONTRAST  TECHNIQUE: Multisequence MR imaging of the spine from the cervical spine to the sacrum was performed prior to and following IV contrast administration for evaluation of spinal metastatic disease.  CONTRAST:  26mL MULTIHANCE GADOBENATE DIMEGLUMINE 529 MG/ML IV SOLN  COMPARISON:  CT of the chest, abdomen and pelvis December 09, 2013; MRI of the brain December 01, 2013  FINDINGS: Cervical Findings:  Cervical vertebral bodies and posterior elements appear intact and aligned with maintenance of cervical lordosis. Mild motion degraded examination. Status post C4-5 ACDF, which results in some susceptibility artifact. Mild to moderate C5-6 degenerative disc, with decreased T2 signal within all cervical disc consistent with desiccation. Mild chronic discogenic endplate changes at all cervical levels. No STIR signal abnormality to suggest acute osseous process. Limited post gadolinium sequence due to spinal hardware,  Cervical spinal cord appears normal morphology and signal characteristics from the cervical medullary junction to at least level of T3-4, most caudal well visualized level. Craniocervical junction maintained. 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 No convincing evidence of abnormal cord, leptomeningeal or epidural enhancement though, limited post gadolinium sagittal sequences and, due to patient's intolerance for further imaging, axial T1 post gadolinium sequence not obtained.  Level by level evaluation:  C2-3: No disc bulge, canal stenosis or neural foraminal narrowing.  C3-4: Uncovertebral hypertrophy and minimal annular bulging without canal stenosis or neural foraminal  narrowing.  C4-5 ACDF.  C5-6: 3 mm RIGHT central broad-based disc protrusion. Uncovertebral hypertrophy. Moderate canal stenosis. Severe RIGHT, moderate to severe LEFT neural foraminal narrowing. There is likely a component adjacent segment disease.  C6-7: 2 mm broad-based disc bulge, mild canal stenosis. Minimal neural foraminal narrowing.  C7-T1: No disc bulge, canal stenosis nor neural foraminal narrowing.  Thoracic Findings:  Mild chronic T4 compression fracture. T2 superior endplate Schmorl's node. No acute fracture. No malalignment. Mild T6 70 T7-8 degenerative discs. Nose abnormal bone marrow signal to suggest acute osseous process. No suspicious osseous nor intradiscal enhancement.  Spinal cord appears normal morphology and signal characteristics with conus medullaris which is partially imaged at T12. No abnormal cord, leptomeningeal or upper dural enhancement. Included prevertebral and paraspinal soft tissues are nonsuspicious.  No significant disc bulge, canal stenosis or neural foraminal narrowing at any thoracic level. Moderate T8-9 facet arthropathy.  Lumbar Findings:  Lumbar vertebral bodies and posterior elements  are intact and aligned with maintenance of the lumbar lordosis. Moderate L5-S1 degenerative disc, mild desiccation L3-4 and L4-5. Mild subacute to chronic discogenic endplate changes Y7-8 through L4-5. No STIR signal abnormality to suggest acute osseous process. No suspicious osseous nor intradiscal enhancement.  Conus medullaris terminates at T12-L1 and appears normal in morphology and signal characteristics. Cauda equina is unremarkable. No suspicious cord, leptomeningeal nor epidural enhancement. Included prevertebral and paraspinal soft tissues are nonsuspicious.  Level by level evaluation:  L1-2 and L2-3: No disc bulge, canal stenosis nor neural foraminal narrowing.  L3-4: 2 mm broad-based disc bulge, mild facet arthropathy and ligamentum flavum redundancy. Minimal canal stenosis. Mild  bilateral neural foraminal narrowing.  L4-5: 2 mm broad-based disc bulge. Mild to moderate facet arthropathy and ligamentum flavum redundancy. Minimal canal stenosis. Moderate bilateral neural foraminal narrowing.  L5-S1: Conjoined RIGHT L5-S1 nerve root. No significant disc bulge. Moderate facet arthropathy without canal stenosis. Mild RIGHT neural foraminal narrowing.  IMPRESSION: MRI CERVICAL SPINE: Severely limited sagittal post gad. Axial T1 post gadolinium sequences not obtained due to inability to tolerate further imaging.  No suspicious MR findings to suggest metastatic disease or infection with cervical spine.  Status post C4-5 ACDF. 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  Moderate canal stenosis at C5-6, mild at C6-7. Severe RIGHT C5-6 neural foraminal narrowing.  MRI THORACIC SPINE: No MR findings to suggest metastatic disease or infection within the thoracic spine.  Mild thoracic degenerative change without neurocompressive findings.  Remote Mild T4 compression fracture.  MRI LUMBAR SPINE: No MR findings to suggest metastatic disease or infection within the lumbar spine.  Degenerative change of the lumbar spine. Minimal canal stenosis L3-4 and L4-5.  Neural foraminal narrowing L3-4 through L5-S1: Moderate bilaterally at L4-5.  Acute findings discussed with and reconfirmed by Dr.JULIE MALLORY on 12/04/2013 at 11:33 pm.   Electronically Signed   By: Elon Alas   On: 12/04/2013 23:34   Mr Thoracic Spine W Wo Contrast  12/04/2013   CLINICAL DATA:  History of stroke, MRSA infection and intravenous drug use presents with fever and leg pain, multiple falls. Diagnosed with intracranial lesions on December 01, 2013 which may reflect abscess or metastatic disease.  EXAM: MRI TOTAL SPINE WITHOUT AND WITH CONTRAST  TECHNIQUE: Multisequence MR imaging of the spine from the cervical spine to the sacrum was performed prior to and following IV  contrast administration for evaluation of spinal metastatic disease.  CONTRAST:  53mL MULTIHANCE GADOBENATE DIMEGLUMINE 529 MG/ML IV SOLN  COMPARISON:  CT of the chest, abdomen and pelvis December 09, 2013; MRI of the brain December 01, 2013  FINDINGS: Cervical Findings:  Cervical vertebral bodies and posterior elements appear intact and aligned with maintenance of cervical lordosis. Mild motion degraded examination. Status post C4-5 ACDF, which results in some susceptibility artifact. Mild to moderate C5-6 degenerative disc, with decreased T2 signal within all cervical disc consistent with desiccation. Mild chronic discogenic endplate changes at all cervical levels. No STIR signal abnormality to suggest acute osseous process. Limited post gadolinium sequence due to spinal hardware,  Cervical spinal cord appears normal morphology and signal characteristics from the cervical medullary junction to at least level of T3-4, most caudal well visualized level. Craniocervical junction maintained. 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 No convincing evidence of abnormal cord, leptomeningeal or epidural enhancement though, limited post gadolinium sagittal sequences and, due to patient's intolerance for further imaging, axial T1 post gadolinium sequence  not obtained.  Level by level evaluation:  C2-3: No disc bulge, canal stenosis or neural foraminal narrowing.  C3-4: Uncovertebral hypertrophy and minimal annular bulging without canal stenosis or neural foraminal narrowing.  C4-5 ACDF.  C5-6: 3 mm RIGHT central broad-based disc protrusion. Uncovertebral hypertrophy. Moderate canal stenosis. Severe RIGHT, moderate to severe LEFT neural foraminal narrowing. There is likely a component adjacent segment disease.  C6-7: 2 mm broad-based disc bulge, mild canal stenosis. Minimal neural foraminal narrowing.  C7-T1: No disc bulge, canal stenosis nor neural foraminal narrowing.  Thoracic Findings:  Mild  chronic T4 compression fracture. T2 superior endplate Schmorl's node. No acute fracture. No malalignment. Mild T6 70 T7-8 degenerative discs. Nose abnormal bone marrow signal to suggest acute osseous process. No suspicious osseous nor intradiscal enhancement.  Spinal cord appears normal morphology and signal characteristics with conus medullaris which is partially imaged at T12. No abnormal cord, leptomeningeal or upper dural enhancement. Included prevertebral and paraspinal soft tissues are nonsuspicious.  No significant disc bulge, canal stenosis or neural foraminal narrowing at any thoracic level. Moderate T8-9 facet arthropathy.  Lumbar Findings:  Lumbar vertebral bodies and posterior elements are intact and aligned with maintenance of the lumbar lordosis. Moderate L5-S1 degenerative disc, mild desiccation L3-4 and L4-5. Mild subacute to chronic discogenic endplate changes J0-9 through L4-5. No STIR signal abnormality to suggest acute osseous process. No suspicious osseous nor intradiscal enhancement.  Conus medullaris terminates at T12-L1 and appears normal in morphology and signal characteristics. Cauda equina is unremarkable. No suspicious cord, leptomeningeal nor epidural enhancement. Included prevertebral and paraspinal soft tissues are nonsuspicious.  Level by level evaluation:  L1-2 and L2-3: No disc bulge, canal stenosis nor neural foraminal narrowing.  L3-4: 2 mm broad-based disc bulge, mild facet arthropathy and ligamentum flavum redundancy. Minimal canal stenosis. Mild bilateral neural foraminal narrowing.  L4-5: 2 mm broad-based disc bulge. Mild to moderate facet arthropathy and ligamentum flavum redundancy. Minimal canal stenosis. Moderate bilateral neural foraminal narrowing.  L5-S1: Conjoined RIGHT L5-S1 nerve root. No significant disc bulge. Moderate facet arthropathy without canal stenosis. Mild RIGHT neural foraminal narrowing.  IMPRESSION: MRI CERVICAL SPINE: Severely limited sagittal post  gad. Axial T1 post gadolinium sequences not obtained due to inability to tolerate further imaging.  No suspicious MR findings to suggest metastatic disease or infection with cervical spine.  Status post C4-5 ACDF. 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  Moderate canal stenosis at C5-6, mild at C6-7. Severe RIGHT C5-6 neural foraminal narrowing.  MRI THORACIC SPINE: No MR findings to suggest metastatic disease or infection within the thoracic spine.  Mild thoracic degenerative change without neurocompressive findings.  Remote Mild T4 compression fracture.  MRI LUMBAR SPINE: No MR findings to suggest metastatic disease or infection within the lumbar spine.  Degenerative change of the lumbar spine. Minimal canal stenosis L3-4 and L4-5.  Neural foraminal narrowing L3-4 through L5-S1: Moderate bilaterally at L4-5.  Acute findings discussed with and reconfirmed by Dr.JULIE MALLORY on 12/04/2013 at 11:33 pm.   Electronically Signed   By: Elon Alas   On: 12/04/2013 23:34   Mr Lumbar Spine W Wo Contrast  12/04/2013   CLINICAL DATA:  History of stroke, MRSA infection and intravenous drug use presents with fever and leg pain, multiple falls. Diagnosed with intracranial lesions on December 01, 2013 which may reflect abscess or metastatic disease.  EXAM: MRI TOTAL SPINE WITHOUT AND WITH CONTRAST  TECHNIQUE: Multisequence MR imaging of the spine from the  cervical spine to the sacrum was performed prior to and following IV contrast administration for evaluation of spinal metastatic disease.  CONTRAST:  71mL MULTIHANCE GADOBENATE DIMEGLUMINE 529 MG/ML IV SOLN  COMPARISON:  CT of the chest, abdomen and pelvis December 09, 2013; MRI of the brain December 01, 2013  FINDINGS: Cervical Findings:  Cervical vertebral bodies and posterior elements appear intact and aligned with maintenance of cervical lordosis. Mild motion degraded examination. Status post C4-5 ACDF,  which results in some susceptibility artifact. Mild to moderate C5-6 degenerative disc, with decreased T2 signal within all cervical disc consistent with desiccation. Mild chronic discogenic endplate changes at all cervical levels. No STIR signal abnormality to suggest acute osseous process. Limited post gadolinium sequence due to spinal hardware,  Cervical spinal cord appears normal morphology and signal characteristics from the cervical medullary junction to at least level of T3-4, most caudal well visualized level. Craniocervical junction maintained. 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 No convincing evidence of abnormal cord, leptomeningeal or epidural enhancement though, limited post gadolinium sagittal sequences and, due to patient's intolerance for further imaging, axial T1 post gadolinium sequence not obtained.  Level by level evaluation:  C2-3: No disc bulge, canal stenosis or neural foraminal narrowing.  C3-4: Uncovertebral hypertrophy and minimal annular bulging without canal stenosis or neural foraminal narrowing.  C4-5 ACDF.  C5-6: 3 mm RIGHT central broad-based disc protrusion. Uncovertebral hypertrophy. Moderate canal stenosis. Severe RIGHT, moderate to severe LEFT neural foraminal narrowing. There is likely a component adjacent segment disease.  C6-7: 2 mm broad-based disc bulge, mild canal stenosis. Minimal neural foraminal narrowing.  C7-T1: No disc bulge, canal stenosis nor neural foraminal narrowing.  Thoracic Findings:  Mild chronic T4 compression fracture. T2 superior endplate Schmorl's node. No acute fracture. No malalignment. Mild T6 70 T7-8 degenerative discs. Nose abnormal bone marrow signal to suggest acute osseous process. No suspicious osseous nor intradiscal enhancement.  Spinal cord appears normal morphology and signal characteristics with conus medullaris which is partially imaged at T12. No abnormal cord, leptomeningeal or upper dural enhancement.  Included prevertebral and paraspinal soft tissues are nonsuspicious.  No significant disc bulge, canal stenosis or neural foraminal narrowing at any thoracic level. Moderate T8-9 facet arthropathy.  Lumbar Findings:  Lumbar vertebral bodies and posterior elements are intact and aligned with maintenance of the lumbar lordosis. Moderate L5-S1 degenerative disc, mild desiccation L3-4 and L4-5. Mild subacute to chronic discogenic endplate changes H3-7 through L4-5. No STIR signal abnormality to suggest acute osseous process. No suspicious osseous nor intradiscal enhancement.  Conus medullaris terminates at T12-L1 and appears normal in morphology and signal characteristics. Cauda equina is unremarkable. No suspicious cord, leptomeningeal nor epidural enhancement. Included prevertebral and paraspinal soft tissues are nonsuspicious.  Level by level evaluation:  L1-2 and L2-3: No disc bulge, canal stenosis nor neural foraminal narrowing.  L3-4: 2 mm broad-based disc bulge, mild facet arthropathy and ligamentum flavum redundancy. Minimal canal stenosis. Mild bilateral neural foraminal narrowing.  L4-5: 2 mm broad-based disc bulge. Mild to moderate facet arthropathy and ligamentum flavum redundancy. Minimal canal stenosis. Moderate bilateral neural foraminal narrowing.  L5-S1: Conjoined RIGHT L5-S1 nerve root. No significant disc bulge. Moderate facet arthropathy without canal stenosis. Mild RIGHT neural foraminal narrowing.  IMPRESSION: MRI CERVICAL SPINE: Severely limited sagittal post gad. Axial T1 post gadolinium sequences not obtained due to inability to tolerate further imaging.  No suspicious MR findings to suggest metastatic disease or infection with cervical spine.  Status post C4-5 ACDF.  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  Moderate canal stenosis at C5-6, mild at C6-7. Severe RIGHT C5-6 neural foraminal narrowing.  MRI THORACIC SPINE: No MR findings to  suggest metastatic disease or infection within the thoracic spine.  Mild thoracic degenerative change without neurocompressive findings.  Remote Mild T4 compression fracture.  MRI LUMBAR SPINE: No MR findings to suggest metastatic disease or infection within the lumbar spine.  Degenerative change of the lumbar spine. Minimal canal stenosis L3-4 and L4-5.  Neural foraminal narrowing L3-4 through L5-S1: Moderate bilaterally at L4-5.  Acute findings discussed with and reconfirmed by Dr.JULIE MALLORY on 12/04/2013 at 11:33 pm.   Electronically Signed   By: Elon Alas   On: 12/04/2013 23:34     Assessment/Plan:  brain abscess = on vanco, ceftriaxone, and metronidazole. Neurosurgery evacuated as much of abscess as possible. Specimens were sent for aerobic, anaerobic, fungal and afb culture. Will also send specimen for 16s pcr if cultures are negative in case that the specimen in sterile already from antibiotic exposure  - will discontinue ampho and continue rest of antimicrobials. Await for pathogen identification by culture to narrow regimen - patients condition appears too frail for TEE. Would cancel procedure since we are going to treat for 8 wk.  - may need panorex to determine if it could also be due to poor dentition/abscess tooth but would wait until clinically stable - defer to palliative care to help with pain management/opiate dependence  - Dr. Megan Salon to see over the holiday weekend to provide [place], [facility] Cell: [phone] Pager: [phone]  12/06/2013, 2:38 PM

## 2013-12-06 NOTE — Clinical Social Work Placement (Addendum)
Clinical Social Work Department CLINICAL SOCIAL WORK PLACEMENT NOTE 12/13/2013  Patient:  [name], [name]  Account Number:  [id number] Admit date:  11/30/2013  Clinical Social Worker:  Greta Doom, LCSWA  Date/time:  12/06/2013 08:29 AM  Clinical Social Work is seeking post-discharge placement for this patient at the following level of care:   SKILLED NURSING   (*CSW will update this form in Epic as items are completed)   12/05/2013  Patient/family provided with [place] Department of Clinical Social Work's list of facilities offering this level of care within the geographic area requested by the patient (or if unable, by the patient's family).  12/05/2013  Patient/family informed of their freedom to choose among providers that offer the needed level of care, that participate in Medicare, Medicaid or managed care program needed by the patient, have an available bed and are willing to accept the patient.  12/05/2013  Patient/family informed of [facility]' ownership interest in [facility], as well as of the fact that they are under no obligation to receive care at this facility.  PASARR submitted to EDS on 12/06/2013 PASARR number received on 12/06/2013  FL2 transmitted to all facilities in geographic area requested by pt/family on  12/05/2013, 12/13/2013     [name], MSW, Latanya Presser [phone] FL2 transmitted to all facilities within larger geographic area on 12/05/2013  Patient informed that his/her managed care company has contracts with or will negotiate with  certain facilities, including the following:     Patient/family informed of bed offers received:   Patient chooses bed at  Physician recommends and patient chooses bed at    Patient to be transferred to [facility] on  12/17/2013 Patient to be transferred to facility by [facility] [place], [place] Patient and family notified of transfer on 12/17/2013 Name of family member  notified:    The following physician request were entered in Epic:   Additional Comments:  [name], MSW, [place]

## 2013-12-06 NOTE — Progress Notes (Signed)
Ampho B still not available. Will consult with Pharmacy.

## 2013-12-06 NOTE — Progress Notes (Addendum)
Patient ID: [name], male   DOB: [birth year], 53 y.o.   MRN: [id number] Patient is alert this morning. Postoperative CT scan shows biopsy sites well targeted. Small amount of hematoma in left superior lesion.  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 Patient is adamant about not continuing further aggressive treatment as he has failed swallowing study today Palliative care is in room talking with patient. Patient appears stable from neurosurgical standpoint. Cultures were sent off along with stains. Further treatment plans per internal medicine.  Patient may be transferred to floor if otherwise felt to be stable from a medical perspective.  Please contact neurosurgery over the weekend if further specific intervention is required. I will follow-up on Monday if patient remains present in the hospital which I anticipate will be the case.

## 2013-12-06 NOTE — Progress Notes (Signed)
PT Cancellation Note  Patient Details Name: [name] MRN: [id number] DOB: Jul 17, 1960   Cancelled Treatment:    Reason Eval/Treat Not Completed: Medical issues which prohibited therapy Patient on bed rest following surgery. Please re-order physical therapy as indicated when pt is medially ready.  Ellouise Newer 12/06/2013, 8:33 AM Elayne Snare, [place]
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
## 2013-12-06 NOTE — Progress Notes (Signed)
Pharmacy notified of need for Ampho B dose due at 1400. Await med arrival.

## 2013-12-06 NOTE — Progress Notes (Signed)
ANTIBIOTIC CONSULT NOTE - FOLLOW UP  Pharmacy Consult for Vancomycin Indication: Brain abscesses  Allergies  Allergen Reactions  . Fish Allergy Hives, Swelling and Rash  . Buprenorphine Hcl-Naloxone Hcl Other (See Comments)    "feel like going to die" jittery, trouble breathing, feels hot  . Shellfish Allergy Hives  . Benadryl [Diphenhydramine Hcl] Hives, Itching and Rash    Patient Measurements: Height: 6\' 1"  (185.4 cm) Weight: 128 lb 3.2 oz (58.151 kg) IBW/kg (Calculated) : 79.9  Vital Signs: Temp: 97.7 F (36.5 C) (11/25 1905) Temp Source: Axillary (11/25 1905) BP: 110/65 mmHg (11/25 1905) Pulse Rate: 86 (11/25 1905) Intake/Output from previous day: 11/24 0701 - 11/25 0700 In: 3752 [I.V.:1802; IV Piggyback:1950] Out: 2105 [Urine:2100; Blood:5] Intake/Output from this shift:    Labs:  Recent Labs  12/05/13 0605 12/05/13 1010 12/06/13 0254  WBC 10.5  --  10.8*  HGB 14.1  --  12.5*  PLT 456*  --  445*  CREATININE 0.58 0.56 0.99   Estimated Creatinine Clearance: 69.4 mL/min (by C-G formula based on Cr of 0.99).  Recent Labs  12/06/13 1944  [place] 35.6*     Microbiology: Recent Results (from the past 720 hour(s))  Urine culture     Status: None   Collection Time: 11/30/13 11:20 PM  Result Value Ref Range Status   Specimen Description URINE, CLEAN CATCH  Final   Special Requests NONE  Final   Culture  Setup Time   Final    12/01/2013 09:01 Performed at [place] Performed at [facility]   Final   Culture NO GROWTH Performed at [facility]   Final   Report Status 12/02/2013 FINAL  Final  Blood culture (routine x 2)     Status: None (Preliminary result)   Collection Time: 12/01/13 12:05 AM  Result Value Ref Range Status   Specimen Description BLOOD RIGHT ARM  Final   Special Requests BOTTLES DRAWN AEROBIC AND ANAEROBIC 5CC  Final   Culture  Setup Time   Final    12/01/2013  04:15 Performed at [facility]    Culture   Final           BLOOD CULTURE RECEIVED NO GROWTH TO DATE CULTURE WILL BE HELD FOR 5 DAYS BEFORE ISSUING A FINAL NEGATIVE REPORT Performed at [facility]    Report Status PENDING  Incomplete  Blood culture (routine x 2)     Status: None (Preliminary result)   Collection Time: 12/01/13 12:08 AM  Result Value Ref Range Status   Specimen Description BLOOD RIGHT HAND  Final   Special Requests BOTTLES DRAWN AEROBIC AND ANAEROBIC 5CC  Final   Culture  Setup Time   Final    12/01/2013 04:15 Performed at [facility]    Culture   Final           BLOOD CULTURE RECEIVED NO GROWTH TO DATE CULTURE WILL BE HELD FOR 5 DAYS BEFORE ISSUING A FINAL NEGATIVE REPORT Performed at [facility]    Report Status PENDING  Incomplete  Culture, expectorated sputum-assessment     Status: None   Collection Time: 12/01/13  4:33 AM  Result Value Ref Range Status   Specimen Description SPUTUM  Final   Special Requests NONE  Final   Sputum evaluation   Final    MICROSCOPIC FINDINGS SUGGEST THAT THIS SPECIMEN IS NOT REPRESENTATIVE OF LOWER RESPIRATORY SECRETIONS. PLEASE RECOLLECT. CALLED TO A.  [place] [phone] 12/01/13 E.GADDY    Report Status 12/01/2013 FINAL  Final  Anaerobic culture     Status: None (Preliminary result)   Collection Time: 12/05/13 10:31 PM  Result Value Ref Range Status   Specimen Description ABSCESS  Final   Special Requests   Final    BILATERAL ASPIRATION OF BRAIN ABSCESS SPECIMEN NO 1   Gram Stain   Final    MODERATE WBC PRESENT, PREDOMINANTLY PMN NO SQUAMOUS EPITHELIAL CELLS SEEN MODERATE GRAM POSITIVE COCCI IN PAIRS Performed at [facility]    Culture PENDING  Incomplete   Report Status PENDING  Incomplete  Fungus Culture with Smear     Status: None (Preliminary result)   Collection Time: 12/05/13 10:31 PM  Result Value Ref Range Status   Specimen Description ABSCESS  Final   Special  Requests   Final    BILATERAL ASPIRATION OF BRAIN ABSCESS SPECIMEN NO 1   Fungal Smear   Final    NO YEAST OR FUNGAL ELEMENTS SEEN Performed at [facility]    Culture   Final    CULTURE IN PROGRESS FOR FOUR WEEKS Performed at [facility]    Report Status PENDING  Incomplete  Culture, routine-abscess     Status: None (Preliminary result)   Collection Time: 12/05/13 10:31 PM  Result Value Ref Range Status   Specimen Description ABSCESS  Final   Special Requests   Final    BILATERAL ASPIRATION OF BRAIN ABSCESS SPECIMEN NO 1   Gram Stain   Final    MODERATE WBC PRESENT, PREDOMINANTLY PMN NO SQUAMOUS EPITHELIAL CELLS SEEN MODERATE GRAM POSITIVE COCCI IN PAIRS Performed at [facility]    Culture PENDING  Incomplete   Report Status PENDING  Incomplete  Anaerobic culture     Status: None (Preliminary result)   Collection Time: 12/05/13 10:37 PM  Result Value Ref Range Status   Specimen Description ABSCESS  Final   Special Requests   Final    BILATERAL ASPIRATION OF BRAIN ABSCESS SPECIMEN NO 2   Gram Stain   Final    ABUNDANT WBC PRESENT, PREDOMINANTLY PMN NO SQUAMOUS EPITHELIAL CELLS SEEN MODERATE GRAM POSITIVE COCCI IN PAIRS Performed at [facility]    Culture PENDING  Incomplete   Report Status PENDING  Incomplete  Fungus Culture with Smear     Status: None (Preliminary result)   Collection Time: 12/05/13 10:37 PM  Result Value Ref Range Status   Specimen Description OTHER  Final   Special Requests   Final    BILATERAL ASPIRATION OF BRAIN ABSCESS SPECIMEN NO 2   Fungal Smear   Final    NO YEAST OR FUNGAL ELEMENTS SEEN Performed at [facility]    Culture   Final    CULTURE IN PROGRESS FOR FOUR WEEKS Performed at [facility]    Report Status PENDING  Incomplete  Fungus Culture with Smear     Status: None (Preliminary result)   Collection Time: 12/05/13 10:37 PM  Result Value Ref Range Status   Specimen  Description ABSCESS  Final   Special Requests   Final    BILATERAL ASPIRATION OF BRAIN ABSCESS SPECIMEN NO 3   Fungal Smear   Final    NO YEAST OR FUNGAL ELEMENTS SEEN Performed at [facility]    Culture   Final    CULTURE IN PROGRESS FOR FOUR WEEKS Performed at [facility]    Report Status PENDING  Incomplete  Culture, routine-abscess     Status: None (Preliminary result)   Collection Time: 12/05/13 10:37 PM  Result Value Ref Range Status   Specimen Description ABSCESS  Final   Special Requests   Final    BILATERAL ASPIRATION OF BRAIN ABSCESS SPECIMEN NO 2   Gram Stain   Final    ABUNDANT WBC PRESENT, PREDOMINANTLY PMN NO SQUAMOUS EPITHELIAL CELLS SEEN MODERATE GRAM POSITIVE COCCI IN PAIRS Performed at [facility]    Culture PENDING  Incomplete   Report Status PENDING  Incomplete  Culture, routine-abscess     Status: None (Preliminary result)   Collection Time: 12/05/13 10:37 PM  Result Value Ref Range Status   Specimen Description ABSCESS  Final   Special Requests   Final    BILATERAL ASPIRATION OF BRAIN ABSCESS SPECIMEN NO 3   Gram Stain   Final    ABUNDANT WBC PRESENT, PREDOMINANTLY PMN NO SQUAMOUS EPITHELIAL CELLS SEEN MODERATE GRAM POSITIVE COCCI IN PAIRS Performed at [facility]    Culture PENDING  Incomplete   Report Status PENDING  Incomplete  Anaerobic culture     Status: None (Preliminary result)   Collection Time: 12/05/13 10:37 PM  Result Value Ref Range Status   Specimen Description ABSCESS  Final   Special Requests BILATERAL ASPIRATION OF BRAIN ABSCESS  Final   Gram Stain   Final    ABUNDANT WBC PRESENT, PREDOMINANTLY PMN NO SQUAMOUS EPITHELIAL CELLS SEEN MODERATE GRAM POSITIVE COCCI IN PAIRS Performed at [facility]    Culture PENDING  Incomplete   Report Status PENDING  Incomplete    Anti-infectives    Start     Dose/Rate Route Frequency Ordered Stop   12/05/13 1800  metroNIDAZOLE (FLAGYL) IVPB  500 mg     500 mg100 mL/hr over 60 Minutes Intravenous Every 6 hours 12/05/13 1430     12/05/13 1000  amphotericin B liposome (AMBISOME) 310 mg in dextrose 5 % 500 mL IVPB  Status:  Discontinued     5 mg/kg  62.2 kg250 mL/hr over 120 Minutes Intravenous Every 24 hours 12/05/13 0942 12/06/13 2011   12/04/13 0600  vancomycin (VANCOCIN) IVPB 1000 mg/200 mL premix  Status:  Discontinued     1,000 mg200 mL/hr over 60 Minutes Intravenous Every 8 hours 12/03/13 2208 12/06/13 2104   12/02/13 2200  vancomycin (VANCOCIN) IVPB 750 mg/150 ml premix  Status:  Discontinued     750 mg150 mL/hr over 60 Minutes Intravenous Every 8 hours 12/02/13 1534 12/03/13 2208   12/02/13 1400  metroNIDAZOLE (FLAGYL) IVPB 500 mg  Status:  Discontinued     500 mg100 mL/hr over 60 Minutes Intravenous Every 6 hours 12/02/13 0755 12/05/13 1430   12/02/13 0930  vancomycin (VANCOCIN) IVPB 750 mg/150 ml premix  Status:  Discontinued     750 mg150 mL/hr over 60 Minutes Intravenous Every 8 hours 12/02/13 0835 12/02/13 1534   12/02/13 0900  cefTRIAXone (ROCEPHIN) 2 g in dextrose 5 % 50 mL IVPB - Premix     2 g100 mL/hr over 30 Minutes Intravenous Every 12 hours 12/02/13 0835     12/02/13 0800  cefTRIAXone (ROCEPHIN) 1 g in dextrose 5 % 50 mL IVPB - Premix  Status:  Discontinued     1 g100 mL/hr over 30 Minutes Intravenous Every 24 hours 12/02/13 0752 12/02/13 0835   12/02/13 0800  metroNIDAZOLE (FLAGYL) IVPB 1 g     1 g200 mL/hr over 60 Minutes Intravenous  Once 12/02/13 3536  12/02/13 1109   12/01/13 0600  ceFEPIme (MAXIPIME) 1 g in dextrose 5 % 50 mL IVPB  Status:  Discontinued     1 g100 mL/hr over 30 Minutes Intravenous 3 times per day 12/01/13 0301 12/01/13 1039   12/01/13 0315  vancomycin (VANCOCIN) IVPB 1000 mg/200 mL premix  Status:  Discontinued     1,000 mg200 mL/hr over 60 Minutes Intravenous Every 12 hours 12/01/13 0301 12/01/13 1039   11/30/13 2345  vancomycin (VANCOCIN) IVPB 1000 mg/200 mL premix  Status:  Discontinued      1,000 mg200 mL/hr over 60 Minutes Intravenous  Once 11/30/13 2344 12/01/13 1039   11/30/13 2345  ceFEPIme (MAXIPIME) 2 g in dextrose 5 % 50 mL IVPB     2 g100 mL/hr over 30 Minutes Intravenous  Once 11/30/13 2344 12/01/13 0109   11/30/13 2330  cefTRIAXone (ROCEPHIN) 1 g in dextrose 5 % 50 mL IVPB  Status:  Discontinued     1 g100 mL/hr over 30 Minutes Intravenous  Once 11/30/13 2329 11/30/13 2331   11/30/13 2330  azithromycin (ZITHROMAX) 500 mg in dextrose 5 % 250 mL IVPB  Status:  Discontinued     500 mg250 mL/hr over 60 Minutes Intravenous  Once 11/30/13 2329 11/30/13 2331     Assessment: 53 YOM admitted 11/30/2013  With LE weakness, pain & falls. Daily IVDA. 50# wt loss in last year. Found to have brain abcess. 11/24 brain abcess bx Pharmacy consulted to dose Vancomycin, also on ceftriaxone and flagyl.   VT was 14 on 750 mg IV q8 hours VT today was 35.6 on 1000 mg IV q8 hours SrCr has increased, UOP remains good   Goal of Therapy:  Vancomycin trough level 15-20 mcg/ml  Plan:  Hold vancomycin Random vanc level with am labs to determine future dosing regimen Thank you for allowing pharmacy to be a part of this patients care team. Excell Seltzer, PharmD  12/06/2013 9:06 PM
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
## 2013-12-07 DIAGNOSIS — F1194 Opioid use, unspecified with opioid-induced mood disorder: Secondary | ICD-10-CM

## 2013-12-07 DIAGNOSIS — F411 Generalized anxiety disorder: Secondary | ICD-10-CM

## 2013-12-07 DIAGNOSIS — B3781 Candidal esophagitis: Secondary | ICD-10-CM

## 2013-12-07 LAB — CBC WITH DIFFERENTIAL/PLATELET
Basophils Absolute: 0 10*3/uL (ref 0.0–0.1)
Basophils Relative: 0 % (ref 0–1)
EOS ABS: 0 10*3/uL (ref 0.0–0.7)
EOS PCT: 0 % (ref 0–5)
HEMATOCRIT: 38 % — AB (ref 39.0–52.0)
Hemoglobin: 12.1 g/dL — ABNORMAL LOW (ref 13.0–17.0)
Lymphocytes Relative: 14 % (ref 12–46)
Lymphs Abs: 1.4 10*3/uL (ref 0.7–4.0)
MCH: 25.8 pg — AB (ref 26.0–34.0)
MCHC: 31.8 g/dL (ref 30.0–36.0)
MCV: 81 fL (ref 78.0–100.0)
MONO ABS: 0.6 10*3/uL (ref 0.1–1.0)
Monocytes Relative: 5 % (ref 3–12)
Neutro Abs: 8.5 10*3/uL — ABNORMAL HIGH (ref 1.7–7.7)
Neutrophils Relative %: 81 % — ABNORMAL HIGH (ref 43–77)
PLATELETS: 412 10*3/uL — AB (ref 150–400)
RBC: 4.69 MIL/uL (ref 4.22–5.81)
RDW: 15.7 % — AB (ref 11.5–15.5)
WBC: 10.5 10*3/uL (ref 4.0–10.5)

## 2013-12-07 LAB — GLUCOSE, CAPILLARY
GLUCOSE-CAPILLARY: 132 mg/dL — AB (ref 70–99)
GLUCOSE-CAPILLARY: 135 mg/dL — AB (ref 70–99)
GLUCOSE-CAPILLARY: 148 mg/dL — AB (ref 70–99)
GLUCOSE-CAPILLARY: 167 mg/dL — AB (ref 70–99)
Glucose-Capillary: 143 mg/dL — ABNORMAL HIGH (ref 70–99)

## 2013-12-07 LAB — BASIC METABOLIC PANEL
Anion gap: 14 (ref 5–15)
BUN: 39 mg/dL — AB (ref 6–23)
CALCIUM: 9 mg/dL (ref 8.4–10.5)
CO2: 19 mEq/L (ref 19–32)
Chloride: 103 mEq/L (ref 96–112)
Creatinine, Ser: 1.08 mg/dL (ref 0.50–1.35)
GFR calc Af Amer: 87 mL/min — ABNORMAL LOW (ref >90)
GFR, EST NON AFRICAN AMERICAN: 75 mL/min — AB (ref >90)
GLUCOSE: 138 mg/dL — AB (ref 70–99)
Potassium: 4.2 mEq/L (ref 3.7–5.3)
Sodium: 136 mEq/L — ABNORMAL LOW (ref 137–147)

## 2013-12-07 LAB — CULTURE, BLOOD (ROUTINE X 2)
Culture: NO GROWTH
Culture: NO GROWTH

## 2013-12-07 LAB — VANCOMYCIN, RANDOM: Vancomycin Rm: 23.6 ug/mL

## 2013-12-07 LAB — PHOSPHORUS: Phosphorus: 4 mg/dL (ref 2.3–4.6)

## 2013-12-07 LAB — MAGNESIUM: Magnesium: 2.4 mg/dL (ref 1.5–2.5)

## 2013-12-07 MED ORDER — FLUCONAZOLE 100 MG PO TABS
100.0000 mg | ORAL_TABLET | Freq: Every day | ORAL | Status: DC
  Administered 2013-12-08 – 2013-12-17 (×9): 100 mg
  Filled 2013-12-07 (×10): qty 1

## 2013-12-07 MED ORDER — FLUCONAZOLE 200 MG PO TABS
200.0000 mg | ORAL_TABLET | Freq: Once | ORAL | Status: AC
  Administered 2013-12-07: 200 mg
  Filled 2013-12-07: qty 1

## 2013-12-07 NOTE — Consult Note (Signed)
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        Psychiatric Specialty Exam: Physical Exam as per history and physical   ROS multiple psychosomatic complaints including headache, neck pain, back pain and pain in his bottom and seeking for pain medication   Blood pressure 112/58, pulse 85, temperature 98.3 F (36.8 C), temperature source Oral, resp. rate 20, height _0  (1.854 m), weight 58.968 kg (130 lb), SpO2 97 %.Body mass index is 17.16 kg/(m^2).  General Appearance: Guarded  Eye Contact::  Fair  Speech:  Slurred  Volume:  Decreased  Mood:  Anxious  Affect:  Constricted and Depressed  Thought Process:  Coherent  Orientation:  Full (Time, Place, and Person)  Thought Content:  WDL  Suicidal Thoughts:  No  Homicidal  Thoughts:  No  Memory:  Immediate;   Fair Recent;   Fair  Judgement:  Intact  Insight:  Fair  Psychomotor Activity:  Decreased  Concentration:  Fair  Recall:  [employer] of Knowledge:Good  Language: Good  Akathisia:  NA  Handed:  Right  AIMS (if indicated):     Assets:  Communication Skills Desire for Improvement Housing Intimacy Leisure Time Resilience Social Support  Sleep:      Musculoskeletal: Strength & Muscle Tone: decreased Gait & Station: unable to stand Patient leans: N/A  Treatment Plan Summary: Daily contact with patient to assess and evaluate symptoms and progress in treatment Medication management  Monitor for substance abuse withdrawal symptoms and no medication management   Kalana Yust,JANARDHAHA R. 12/07/2013 11:21 AM

## 2013-12-07 NOTE — Progress Notes (Signed)
Subjective: Pt wanted to go home. Does not want to have NG tube in place. GF and sitter at bedside.    Objective: Vital signs in last 24 hours: Filed Vitals:   12/06/13 2122 12/07/13 0511 12/07/13 0603 12/07/13 0902  BP: 115/63 112/58    Pulse: 87 85    Temp: 97.7 F (36.5 C) 98.3 F (36.8 C)    TempSrc: Oral Oral    Resp: 18 20    Height:      Weight:   130 lb (58.968 kg)   SpO2: 100% 96%  97%   Weight change: 1 lb 12.8 oz (0.816 kg)  Intake/Output Summary (Last 24 hours) at 12/07/13 1056 Last data filed at 12/07/13 0427  Gross per 24 hour  Intake    580 ml  Output   1445 ml  Net   -865 ml   PE General: NAD, cachetic   HEENT: PERRLA, significant white exudate in throat.  Lungs: CTAB on anterior auscultation  Cardiac: RRR, S1/S2 HD:QQIW, scaphoid abdomen, active bowel sounds Ext: unable to plantar flex b/l, SCDs b/l, unable to grip left hand  Lab Results: Basic Metabolic Panel:  Recent Labs Lab 12/01/13 0414  12/06/13 0254 12/07/13 0500  NA 139  < > 134* 136*  K 3.6*  < > 4.4 4.2  CL 102  < > 101 103  CO2 25  < > 18* 19  GLUCOSE 97  < > 115* 138*  BUN 11  < > 28* 39*  CREATININE 0.68  < > 0.99 1.08  CALCIUM 8.5  < > 8.9 9.0  MG 2.0  --   --  2.4  PHOS  --   --   --  4.0  < > = values in this interval not displayed. Liver Function Tests:  Recent Labs Lab 11/30/13 2216 12/01/13 0414  AST 82* 60*  ALT 40 34  ALKPHOS 77 73  BILITOT 0.4 0.4  PROT 8.8* 7.6  ALBUMIN 2.6* 2.4*   CBC:  Recent Labs Lab 12/06/13 0254 12/07/13 0500  WBC 10.8* 10.5  NEUTROABS 8.5* 8.5*  HGB 12.5* 12.1*  HCT 38.9* 38.0*  MCV 80.9 81.0  PLT 445* 412*   Cardiac Enzymes:  Recent Labs Lab 12/05/13 1010 12/05/13 1520 12/05/13 2336  TROPONINI <0.30 <0.30 <0.30   BNP:  Recent Labs Lab 11/30/13 2216  PROBNP 397.8*   CBG:  Recent Labs Lab 12/05/13 2310 12/06/13 0834 12/06/13 1214 12/06/13 1650 12/06/13 2123 12/07/13 0817  GLUCAP 156* 108* 124*  131* 94 132*   Urine Drug Screen: Drugs of Abuse     Component Value Date/Time   LABOPIA POSITIVE* 12/01/2013 0533   LABOPIA NEG 06/01/2011 1115   COCAINSCRNUR NONE DETECTED 12/01/2013 0533   COCAINSCRNUR NEG 06/01/2011 1115   LABBENZ NONE DETECTED 12/01/2013 0533   LABBENZ NEG 06/01/2011 1115   LABBENZ NEG 02/18/2009 2050   AMPHETMU NONE DETECTED 12/01/2013 0533   AMPHETMU NEG 06/01/2011 1115   AMPHETMU NEG 02/18/2009 2050   THCU NONE DETECTED 12/01/2013 0533   THCU NEG 06/01/2011 1115   LABBARB NONE DETECTED 12/01/2013 0533   LABBARB NEG 06/01/2011 1115      Micro Results: Recent Results (from the past 240 hour(s))  Urine culture     Status: None   Collection Time: 11/30/13 11:20 PM  Result Value Ref Range Status   Specimen Description URINE, CLEAN CATCH  Final   Special Requests NONE  Final   Culture  Setup Time   Final  12/01/2013 09:01 Performed at [place] Performed at [facility]   Final   Culture NO GROWTH Performed at [facility]   Final   Report Status 12/02/2013 FINAL  Final  Blood culture (routine x 2)     Status: None (Preliminary result)   Collection Time: 12/01/13 12:05 AM  Result Value Ref Range Status   Specimen Description BLOOD RIGHT ARM  Final   Special Requests BOTTLES DRAWN AEROBIC AND ANAEROBIC 5CC  Final   Culture  Setup Time   Final    12/01/2013 04:15 Performed at [facility]    Culture   Final           BLOOD CULTURE RECEIVED NO GROWTH TO DATE CULTURE WILL BE HELD FOR 5 DAYS BEFORE ISSUING A FINAL NEGATIVE REPORT Performed at [facility]    Report Status PENDING  Incomplete  Blood culture (routine x 2)     Status: None (Preliminary result)   Collection Time: 12/01/13 12:08 AM  Result Value Ref Range Status   Specimen Description BLOOD RIGHT HAND  Final   Special Requests BOTTLES DRAWN AEROBIC AND ANAEROBIC 5CC  Final   Culture  Setup Time   Final     12/01/2013 04:15 Performed at [facility]    Culture   Final           BLOOD CULTURE RECEIVED NO GROWTH TO DATE CULTURE WILL BE HELD FOR 5 DAYS BEFORE ISSUING A FINAL NEGATIVE REPORT Performed at [facility]    Report Status PENDING  Incomplete  Culture, expectorated sputum-assessment     Status: None   Collection Time: 12/01/13  4:33 AM  Result Value Ref Range Status   Specimen Description SPUTUM  Final   Special Requests NONE  Final   Sputum evaluation   Final    MICROSCOPIC FINDINGS SUGGEST THAT THIS SPECIMEN IS NOT REPRESENTATIVE OF LOWER RESPIRATORY SECRETIONS. PLEASE RECOLLECT. CALLED TO [facility] [phone] 12/01/13 E.GADDY    Report Status 12/01/2013 FINAL  Final  Anaerobic culture     Status: None (Preliminary result)   Collection Time: 12/05/13 10:31 PM  Result Value Ref Range Status   Specimen Description ABSCESS  Final   Special Requests   Final    BILATERAL ASPIRATION OF BRAIN ABSCESS SPECIMEN NO 1   Gram Stain   Final    MODERATE WBC PRESENT, PREDOMINANTLY PMN NO SQUAMOUS EPITHELIAL CELLS SEEN MODERATE GRAM POSITIVE COCCI IN PAIRS Performed at [facility]    Culture PENDING  Incomplete   Report Status PENDING  Incomplete  Fungus Culture with Smear     Status: None (Preliminary result)   Collection Time: 12/05/13 10:31 PM  Result Value Ref Range Status   Specimen Description ABSCESS  Final   Special Requests   Final    BILATERAL ASPIRATION OF BRAIN ABSCESS SPECIMEN NO 1   Fungal Smear   Final    NO YEAST OR FUNGAL ELEMENTS SEEN Performed at [facility]    Culture   Final    CULTURE IN PROGRESS FOR FOUR WEEKS Performed at [facility]    Report Status PENDING  Incomplete  Culture, routine-abscess     Status: None (Preliminary result)   Collection Time: 12/05/13 10:31 PM  Result Value Ref Range Status   Specimen Description ABSCESS  Final   Special Requests   Final    BILATERAL ASPIRATION OF BRAIN ABSCESS  SPECIMEN NO 1   Gram Stain   Final    MODERATE WBC PRESENT, PREDOMINANTLY PMN NO SQUAMOUS EPITHELIAL CELLS SEEN MODERATE GRAM POSITIVE COCCI IN PAIRS Performed at [facility]    Culture   Final    NO GROWTH 1 DAY Performed at [facility]    Report Status PENDING  Incomplete  Anaerobic culture     Status: None (Preliminary result)   Collection Time: 12/05/13 10:37 PM  Result Value Ref Range Status   Specimen Description ABSCESS  Final   Special Requests   Final    BILATERAL ASPIRATION OF BRAIN ABSCESS SPECIMEN NO 2   Gram Stain   Final    ABUNDANT WBC PRESENT, PREDOMINANTLY PMN NO SQUAMOUS EPITHELIAL CELLS SEEN MODERATE GRAM POSITIVE COCCI IN PAIRS Performed at [facility]    Culture PENDING  Incomplete   Report Status PENDING  Incomplete  Fungus Culture with Smear     Status: None (Preliminary result)   Collection Time: 12/05/13 10:37 PM  Result Value Ref Range Status   Specimen Description OTHER  Final   Special Requests   Final    BILATERAL ASPIRATION OF BRAIN ABSCESS SPECIMEN NO 2   Fungal Smear   Final    NO YEAST OR FUNGAL ELEMENTS SEEN Performed at [facility]    Culture   Final    CULTURE IN PROGRESS FOR FOUR WEEKS Performed at [facility]    Report Status PENDING  Incomplete  Fungus Culture with Smear     Status: None (Preliminary result)   Collection Time: 12/05/13 10:37 PM  Result Value Ref Range Status   Specimen Description ABSCESS  Final   Special Requests   Final    BILATERAL ASPIRATION OF BRAIN ABSCESS SPECIMEN NO 3   Fungal Smear   Final    NO YEAST OR FUNGAL ELEMENTS SEEN Performed at [facility]    Culture   Final    CULTURE IN PROGRESS FOR FOUR WEEKS Performed at [facility]    Report Status PENDING  Incomplete  Culture, routine-abscess     Status: None (Preliminary result)   Collection Time: 12/05/13 10:37 PM  Result Value Ref Range Status   Specimen Description ABSCESS   Final   Special Requests   Final    BILATERAL ASPIRATION OF BRAIN ABSCESS SPECIMEN NO 2   Gram Stain   Final    ABUNDANT WBC PRESENT, PREDOMINANTLY PMN NO SQUAMOUS EPITHELIAL CELLS SEEN MODERATE GRAM POSITIVE COCCI IN PAIRS Performed at [facility]    Culture   Final    NO GROWTH 1 DAY Performed at [facility]    Report Status PENDING  Incomplete  Culture, routine-abscess     Status: None (Preliminary result)   Collection Time: 12/05/13 10:37 PM  Result Value Ref Range Status   Specimen Description ABSCESS  Final   Special Requests   Final    BILATERAL ASPIRATION OF BRAIN ABSCESS SPECIMEN NO 3   Gram Stain   Final    ABUNDANT WBC PRESENT, PREDOMINANTLY PMN NO SQUAMOUS EPITHELIAL CELLS SEEN MODERATE GRAM POSITIVE COCCI IN PAIRS Performed at [facility]    Culture   Final    NO GROWTH 1 DAY Performed at [facility]    Report Status PENDING  Incomplete  Anaerobic culture     Status: None (Preliminary result)   Collection Time: 12/05/13 10:37 PM  Result Value Ref Range Status   Specimen Description  ABSCESS  Final   Special Requests BILATERAL ASPIRATION OF BRAIN ABSCESS  Final   Gram Stain   Final    ABUNDANT WBC PRESENT, PREDOMINANTLY PMN NO SQUAMOUS EPITHELIAL CELLS SEEN MODERATE GRAM POSITIVE COCCI IN PAIRS Performed at [facility]    Culture PENDING  Incomplete   Report Status PENDING  Incomplete   Studies/Results: Ct Head Wo Contrast  12/06/2013   CLINICAL DATA:  Brain abscess status post aspiration  EXAM: CT HEAD WITHOUT CONTRAST  TECHNIQUE: Contiguous axial images were obtained from the base of the skull through the vertex without intravenous contrast.  COMPARISON:  Head CT 12/04/2013  FINDINGS: New bifrontal bur holes for surgical access. No acute osseous findings.  No mastoid or sinus opacification.  Three brain abscesses status post decompression:  *A high right frontal white matter abscess is decompressed, currently  23 mm in diameter, previously 27 mm. *An abscess centered in the right anterior putamen is also smaller, currently 28 mm in diameter, previously 38mm. There is an associated decrease in mass effect on the frontal horn of the wall right lateral ventricle and decrease in edema. *A peripheral high and posterior left frontal abscess is stable at 25 mm. This cavity contains gas, fluid, and a tiny focus of postoperative hemorrhage.  Mild decreased in vasogenic edema. Leftward shift it is currently 4 mm. No evidence of intraventricular debris or hydrocephalus. No acute infarct. Postoperative pneumocephalus.  IMPRESSION: 1. Three cerebral abscesses status post decompression. Vasogenic edema and mass effect is improving. 2. Tiny postoperative hemorrhage in the left frontal cavity. 3. No ventriculomegaly or ventricular debris.   Electronically Signed   By: Jorje Guild M.D.   On: 12/06/2013 05:17   Ct Head W Contrast  12/05/2013   CLINICAL DATA:  53 year old male with intracranial abscesses. Stealth exam. Pre surgical. Subsequent encounter.  EXAM: CT HEAD WITH CONTRAST  TECHNIQUE: Contiguous axial images were obtained from the base of the skull through the vertex with intravenous contrast.  CONTRAST:  74mL OMNIPAQUE IOHEXOL 300 MG/ML  SOLN  COMPARISON:  12/04/2013 head CT.  12/01/2013 brain MR.  FINDINGS: Three intracranial ring-enhancing lesions suggestive of intracranial abscesses which have increased in size from the recent MR. This includes posterior left frontal - parietal lobe 3 cm ring-enhancing lesion, posterior right frontal lobe 2.7 cm enhancing lesion and right basal ganglia 3.2 cm enhancing lesion.  Marked surrounding vasogenic edema and local mass effect including compression of the right lateral ventricle and bowing of the third ventricle to left.  No evidence of acute thrombotic infarct or intracranial hemorrhage.  IMPRESSION: Three intracranial ring-enhancing lesions suggestive of intracranial abscesses  which have increased in size from the recent MR. This includes posterior left frontal - parietal lobe 3 cm ring-enhancing lesion, posterior right frontal lobe 2.7 cm enhancing lesion and right basal ganglia 3.2 cm enhancing lesion.  Marked surrounding vasogenic edema and local mass effect including compression of the right lateral ventricle and bowing of the third ventricle to left.   Electronically Signed   By: Chauncey Cruel M.D.   On: 12/05/2013 11:46   Dg Abd Portable 1v  12/06/2013   CLINICAL DATA:  Portable abdomen to assess for NG tube placement  EXAM: PORTABLE ABDOMEN - 1 VIEW  COMPARISON:  12/06/2013  FINDINGS: There is a feeding tube with tip in the projection of the proximal portion of the duodenum. The bowel gas pattern appears nonspecific. There are a few prominent loops of small bowel measuring up to 3.1 cm.  Enteric contrast material is identified within pelvic small bowel loops and proximal colon.  IMPRESSION: The tip of the feeding tube is in the expected location of the proximal duodenum.   Electronically Signed   By: Kerby Moors M.D.   On: 12/06/2013 18:37   Dg Abd Portable 1v  12/06/2013   CLINICAL DATA:  Nasogastric tube placement.  Subsequent encounter.  EXAM: PORTABLE ABDOMEN - 1 VIEW  COMPARISON:  12/01/2013, 11/30/2013.  FINDINGS: Enteric tube is present with the tip in the proximal stomach. The side-port is in the distal esophagus. This should be advanced at least 7 cm to prevent gastroesophageal reflux. Airspace disease in the RIGHT middle lobe is again noted.  IMPRESSION: Enteric tube should be advanced at least 7 cm to prevent gastroesophageal reflux.   Electronically Signed   By: Dereck Ligas M.D.   On: 12/06/2013 14:49   Medications: I have reviewed the patient's current medications. Scheduled Meds: . cefTRIAXone (ROCEPHIN)  IV  2 g Intravenous Q12H  . dexamethasone  4 mg Intravenous 4 times per day  . famotidine  10 mg Oral BID  . feeding supplement (GLUCERNA SHAKE)   237 mL Oral BID BM  . fenofibrate  54 mg Oral Daily  . levETIRAcetam  500 mg Intravenous Q12H  . metronidazole  500 mg Intravenous Q6H  . mometasone-formoterol  2 puff Inhalation BID  . multivitamin with minerals  1 tablet Oral Daily  . neomycin-bacitracin-polymyxin   Topical BID  . nicotine  21 mg Transdermal Q24H  . pantoprazole (PROTONIX) IV  40 mg Intravenous QHS  . polyethylene glycol  17 g Oral BID   Continuous Infusions: . sodium chloride 20 mL/hr at 12/03/13 0805  . sodium chloride 20 mL/hr at 12/06/13 0400  . feeding supplement (JEVITY 1.2 CAL) 20 mL/hr (12/06/13 1649)   PRN Meds:.acetaminophen, albuterol, clonazePAM, diclofenac sodium, hydrocortisone cream, hydrOXYzine, ipratropium-albuterol, labetalol, morphine injection, ondansetron **OR** ondansetron (ZOFRAN) IV, promethazine Assessment/Plan: Principal Problem:   Brain mass Active Problems:   Dyslipidemia   Anxiety state   HYPERTENSION, BENIGN ESSENTIAL   Peripheral vascular disease   COPD (chronic obstructive pulmonary disease)   Tobacco abuse   Abnormality of gait   Loss of weight   Malnutrition   Pneumonia   Heroin abuse   Protein-calorie malnutrition, severe   Malignancy   Rectal mass   Abnormal CT scan, pelvis   Brain abscess   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  - Decadron 4mg  q6h.  - TEE cancelled 2/2 to pt's fragility  - blood cultures negative - Neuro checks - Dr. Baxter Flattery w/ ID following, recommend to start vanc, flagyl, and rocephin; day 5.D/c amp B as no yeast were seen on gram stain from biopsy - 2 days s/p brain abscess biopsy by Dr. Ellene Route; Anaerobic culture reveals GPC -  Will start to look into SNF placement that allows methadone ( awaiting palliative care  consult note to provide further guidance regarding methadone administration), along with PICC line placement - palliative care consulted for multiple medical conditions and methadone for pain. Mary from palliative care saw patient, currently awaiting recommendations. Do not want to try fentanyl even though this has worked for pt in the past as this will limit further pain medication options and is also short acting. Would  prefer to give methadone for pain if possible.   Esophageal candidiasis-- pt received one dose of amp B on 11/24 - diflucan 200mg  today, 100mg  starting tomorrow for 21 days (end date 12/18)  Hyponatremia-- 2/2 cerebral salt wasting vs SIADH.  Na 136 today vs 134 yesterday -improving, will continue to monitor  Dysphagia- failed barium study yesterday, SLP following -  NG tube placed yesterday, nutrition following  Metastatic disease with cavitary Pneumonia: Was treated for PNA in 09/2013 with azithromycin, then had infiltrative finding on CXR in 10/2013. Has persistent infiltrate in RML suggestive of pneumonia. Has remote history of TB. - abx as noted above    Lower Extremity pain now w/ new weakness: Dopplers negative for DVT. MRI of cervical, thoracic, and lumbar spine w/ and w/o contrast neg for malignancy, minimal disc bulging noted along with chronic T4 compression fracture noted - morphine 3mg  q4h - will cont to monitor  Polysubstance Abuse: 50 pack year smoker who started using IV heroin about a year ago when his pain medications were not renewed. - nicotine patch - Morphine 3mg  q4h, can increase to q3h if needed, no withdrawal since admission  - concern for wife possibly giving pt drugs;sons state it is a possibility she may slip drugs. Sitter in the room w/ pt  COPD: Progressive emphysema; smoking history of 50 pack years. Had recent PFTs. Uses combivent and advair. - Continue home medications  Anxiety:  - Continue klonopin 0.5mg  BID   DMII: Stable and  well-controlled on metformin. Hold metformin can start sliding scale sensitive if CBGs elevated - continue CBG checks q4  Essential Hypertension: normotensive. Will continue to monitor. Did not see any BP home meds listed.  DVT Ppx:  - SCDs  Diet:  - clear liquids per NG tube  Dispo: Disposition is deferred at this time, awaiting improvement of current medical problems. Anticipated discharge in approximately 5 day(s).   The patient does have a current PCP (Kelby Aline, MD) and does need an [facility] hospital follow-up appointment after discharge.  The patient does have transportation limitations that hinder transportation to clinic appointments.  .Services Needed at time of discharge: Y = Yes, Blank = No PT:   OT: SNF  RN:   Equipment:   Other:     LOS: 7 days   Julious Oka, MD 12/07/2013, 10:56 AM

## 2013-12-07 NOTE — Progress Notes (Signed)
Patient ID: [name], male   DOB: 08/21/1960, 53 y.o.   MRN: [id number]         [facility] for Infectious Disease    Date of Admission:  11/30/2013   Total days of antibiotics 8          Principal Problem:   Brain mass Active Problems:   Dyslipidemia   Anxiety state   HYPERTENSION, BENIGN ESSENTIAL   Peripheral vascular disease   COPD (chronic obstructive pulmonary disease)   Tobacco abuse   Abnormality of gait   Loss of weight   Malnutrition   Pneumonia   Heroin abuse   Protein-calorie malnutrition, severe   Malignancy   Rectal mass   Abnormal CT scan, pelvis   Brain abscess   . cefTRIAXone (ROCEPHIN)  IV  2 g Intravenous Q12H  . dexamethasone  4 mg Intravenous 4 times per day  . famotidine  10 mg Oral BID  . feeding supplement (GLUCERNA SHAKE)  237 mL Oral BID BM  . fenofibrate  54 mg Oral Daily  . [START ON 12/08/2013] fluconazole  100 mg Per Tube Daily  . levETIRAcetam  500 mg Intravenous Q12H  . metronidazole  500 mg Intravenous Q6H  . mometasone-formoterol  2 puff Inhalation BID  . multivitamin with minerals  1 tablet Oral Daily  . neomycin-bacitracin-polymyxin   Topical BID  . nicotine  21 mg Transdermal Q24H  . pantoprazole (PROTONIX) IV  40 mg Intravenous QHS  . polyethylene glycol  17 g Oral BID   OBJECTIVE: Blood pressure 124/68, pulse 83, temperature 98.1 F (36.7 C), temperature source Axillary, resp. rate 20, height 6\' 1"  (1.854 m), weight 130 lb (58.968 kg), SpO2 98 %.  Lab Results Lab Results  Component Value Date   WBC 10.5 12/07/2013   HGB 12.1* 12/07/2013   HCT 38.0* 12/07/2013   MCV 81.0 12/07/2013   PLT 412* 12/07/2013    Lab Results  Component Value Date   CREATININE 1.08 12/07/2013   BUN 39* 12/07/2013   NA 136* 12/07/2013   K 4.2 12/07/2013   CL 103 12/07/2013   CO2 19 12/07/2013    Lab Results  Component Value Date   ALT 34 12/01/2013   AST 60* 12/01/2013   ALKPHOS 73 12/01/2013   BILITOT 0.4 12/01/2013       Microbiology: Recent Results (from the past 240 hour(s))  Urine culture     Status: None   Collection Time: 11/30/13 11:20 PM  Result Value Ref Range Status   Specimen Description URINE, CLEAN CATCH  Final   Special Requests NONE  Final   Culture  Setup Time   Final    12/01/2013 09:01 Performed at [place] Performed at [facility]   Final   Culture NO GROWTH Performed at [facility]   Final   Report Status 12/02/2013 FINAL  Final  Blood culture (routine x 2)     Status: None   Collection Time: 12/01/13 12:05 AM  Result Value Ref Range Status   Specimen Description BLOOD RIGHT ARM  Final   Special Requests BOTTLES DRAWN AEROBIC AND ANAEROBIC 5CC  Final   Culture  Setup Time   Final    12/01/2013 04:15 Performed at [place]   Final    NO GROWTH 5 DAYS Performed at [facility]    Report Status 12/07/2013 FINAL  Final  Blood culture (routine  x 2)     Status: None   Collection Time: 12/01/13 12:08 AM  Result Value Ref Range Status   Specimen Description BLOOD RIGHT HAND  Final   Special Requests BOTTLES DRAWN AEROBIC AND ANAEROBIC 5CC  Final   Culture  Setup Time   Final    12/01/2013 04:15 Performed at [facility]    Culture   Final    NO GROWTH 5 DAYS Performed at [facility]    Report Status 12/07/2013 FINAL  Final  Culture, expectorated sputum-assessment     Status: None   Collection Time: 12/01/13  4:33 AM  Result Value Ref Range Status   Specimen Description SPUTUM  Final   Special Requests NONE  Final   Sputum evaluation   Final    MICROSCOPIC FINDINGS SUGGEST THAT THIS SPECIMEN IS NOT REPRESENTATIVE OF LOWER RESPIRATORY SECRETIONS. PLEASE RECOLLECT. CALLED TO [facility] [phone] 12/01/13 E.GADDY    Report Status 12/01/2013 FINAL  Final  Anaerobic culture     Status: None (Preliminary result)   Collection Time: 12/05/13 10:31 PM  Result Value Ref  Range Status   Specimen Description ABSCESS  Final   Special Requests   Final    BILATERAL ASPIRATION OF BRAIN ABSCESS SPECIMEN NO 1   Gram Stain   Final    MODERATE WBC PRESENT, PREDOMINANTLY PMN NO SQUAMOUS EPITHELIAL CELLS SEEN MODERATE GRAM POSITIVE COCCI IN PAIRS Performed at [facility]    Culture   Final    NO ANAEROBES ISOLATED; CULTURE IN PROGRESS FOR 5 DAYS Performed at [facility]    Report Status PENDING  Incomplete  Fungus Culture with Smear     Status: None (Preliminary result)   Collection Time: 12/05/13 10:31 PM  Result Value Ref Range Status   Specimen Description ABSCESS  Final   Special Requests   Final    BILATERAL ASPIRATION OF BRAIN ABSCESS SPECIMEN NO 1   Fungal Smear   Final    NO YEAST OR FUNGAL ELEMENTS SEEN Performed at [facility]    Culture   Final    CULTURE IN PROGRESS FOR FOUR WEEKS Performed at [facility]    Report Status PENDING  Incomplete  Culture, routine-abscess     Status: None (Preliminary result)   Collection Time: 12/05/13 10:31 PM  Result Value Ref Range Status   Specimen Description ABSCESS  Final   Special Requests   Final    BILATERAL ASPIRATION OF BRAIN ABSCESS SPECIMEN NO 1   Gram Stain   Final    MODERATE WBC PRESENT, PREDOMINANTLY PMN NO SQUAMOUS EPITHELIAL CELLS SEEN MODERATE GRAM POSITIVE COCCI IN PAIRS Performed at [facility]    Culture   Final    NO GROWTH 1 DAY Performed at [facility]    Report Status PENDING  Incomplete  Anaerobic culture     Status: None (Preliminary result)   Collection Time: 12/05/13 10:37 PM  Result Value Ref Range Status   Specimen Description ABSCESS  Final   Special Requests   Final    BILATERAL ASPIRATION OF BRAIN ABSCESS SPECIMEN NO 2   Gram Stain   Final    ABUNDANT WBC PRESENT, PREDOMINANTLY PMN NO SQUAMOUS EPITHELIAL CELLS SEEN MODERATE GRAM POSITIVE COCCI IN PAIRS Performed at [facility]    Culture    Final    NO ANAEROBES ISOLATED; CULTURE IN PROGRESS FOR 5 DAYS Performed at [facility]    Report Status  PENDING  Incomplete  Fungus Culture with Smear     Status: None (Preliminary result)   Collection Time: 12/05/13 10:37 PM  Result Value Ref Range Status   Specimen Description OTHER  Final   Special Requests   Final    BILATERAL ASPIRATION OF BRAIN ABSCESS SPECIMEN NO 2   Fungal Smear   Final    NO YEAST OR FUNGAL ELEMENTS SEEN Performed at [facility]    Culture   Final    CULTURE IN PROGRESS FOR FOUR WEEKS Performed at [facility]    Report Status PENDING  Incomplete  Fungus Culture with Smear     Status: None (Preliminary result)   Collection Time: 12/05/13 10:37 PM  Result Value Ref Range Status   Specimen Description ABSCESS  Final   Special Requests   Final    BILATERAL ASPIRATION OF BRAIN ABSCESS SPECIMEN NO 3   Fungal Smear   Final    NO YEAST OR FUNGAL ELEMENTS SEEN Performed at [facility]    Culture   Final    CULTURE IN PROGRESS FOR FOUR WEEKS Performed at [facility]    Report Status PENDING  Incomplete  Culture, routine-abscess     Status: None (Preliminary result)   Collection Time: 12/05/13 10:37 PM  Result Value Ref Range Status   Specimen Description ABSCESS  Final   Special Requests   Final    BILATERAL ASPIRATION OF BRAIN ABSCESS SPECIMEN NO 2   Gram Stain   Final    ABUNDANT WBC PRESENT, PREDOMINANTLY PMN NO SQUAMOUS EPITHELIAL CELLS SEEN MODERATE GRAM POSITIVE COCCI IN PAIRS Performed at [facility]    Culture   Final    NO GROWTH 1 DAY Performed at [facility]    Report Status PENDING  Incomplete  Culture, routine-abscess     Status: None (Preliminary result)   Collection Time: 12/05/13 10:37 PM  Result Value Ref Range Status   Specimen Description ABSCESS  Final   Special Requests   Final    BILATERAL ASPIRATION OF BRAIN ABSCESS SPECIMEN NO 3   Gram Stain   Final     ABUNDANT WBC PRESENT, PREDOMINANTLY PMN NO SQUAMOUS EPITHELIAL CELLS SEEN MODERATE GRAM POSITIVE COCCI IN PAIRS Performed at [facility]    Culture   Final    NO GROWTH 1 DAY Performed at [facility]    Report Status PENDING  Incomplete  Anaerobic culture     Status: None (Preliminary result)   Collection Time: 12/05/13 10:37 PM  Result Value Ref Range Status   Specimen Description ABSCESS  Final   Special Requests BILATERAL ASPIRATION OF BRAIN ABSCESS  Final   Gram Stain   Final    ABUNDANT WBC PRESENT, PREDOMINANTLY PMN NO SQUAMOUS EPITHELIAL CELLS SEEN MODERATE GRAM POSITIVE COCCI IN PAIRS Performed at [facility]    Culture   Final    NO ANAEROBES ISOLATED; CULTURE IN PROGRESS FOR 5 DAYS Performed at [facility]    Report Status PENDING  Incomplete    Assessment: All brain abscess cultures remain negative. I will continue current broad, empiric antibiotic therapy.  Plan: 1. Continue ceftriaxone, metronidazole and vancomycin pending final culture results  Michel Bickers, MD [facility] for [place] [phone] pager   [phone] cell 12/07/2013, 2:02 PM

## 2013-12-08 DIAGNOSIS — R531 Weakness: Secondary | ICD-10-CM

## 2013-12-08 DIAGNOSIS — Z7189 Other specified counseling: Secondary | ICD-10-CM

## 2013-12-08 DIAGNOSIS — M549 Dorsalgia, unspecified: Secondary | ICD-10-CM

## 2013-12-08 DIAGNOSIS — Z515 Encounter for palliative care: Secondary | ICD-10-CM

## 2013-12-08 LAB — GLUCOSE, CAPILLARY
GLUCOSE-CAPILLARY: 176 mg/dL — AB (ref 70–99)
GLUCOSE-CAPILLARY: 153 mg/dL — AB (ref 70–99)
Glucose-Capillary: 150 mg/dL — ABNORMAL HIGH (ref 70–99)
Glucose-Capillary: 198 mg/dL — ABNORMAL HIGH (ref 70–99)
Glucose-Capillary: 122 mg/dL — ABNORMAL HIGH (ref 70–99)

## 2013-12-08 LAB — VANCOMYCIN, RANDOM: Vancomycin Rm: 6.1 ug/mL

## 2013-12-08 MED ORDER — MORPHINE SULFATE 15 MG PO TABS
7.5000 mg | ORAL_TABLET | ORAL | Status: DC | PRN
Start: 2013-12-08 — End: 2013-12-09
  Administered 2013-12-08 – 2013-12-09 (×5): 7.5 mg via ORAL
  Filled 2013-12-08 (×5): qty 1

## 2013-12-08 MED ORDER — MORPHINE SULFATE 15 MG PO TABS: 15.0000 mg | ORAL_TABLET | ORAL | Status: DC | PRN

## 2013-12-08 MED ORDER — MORPHINE SULFATE 2 MG/ML IJ SOLN
2.0000 mg | Freq: Four times a day (QID) | INTRAMUSCULAR | Status: DC | PRN
  Administered 2013-12-08 – 2013-12-09 (×2): 2 mg via INTRAVENOUS
  Filled 2013-12-08 (×3): qty 1
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
## 2013-12-08 NOTE — Plan of Care (Signed)
Problem: Acute Rehab PT Goals(only PT should resolve) Goal: Pt Will Ambulate Outcome: Not Applicable Date Met:  83/66/29 Goal: PT Additional Goal #1 Outcome: Not Applicable Date Met:  47/65/46

## 2013-12-08 NOTE — Progress Notes (Signed)
Physical Therapy Treatment Patient Details Name: WILBERTH DAMON MRN: [id number] DOB: [birth year] Today's Date: 12/08/2013    History of Present Illness Patient is a 53 yo male admitted 11/30/13 with BLE pain and edema, with multiple falls.  Patient with h/o brain mass, CVA, PVD, COPD, CHF, IV heroin use, Hep C, CHF, DM.    PT Comments    Progressing slowly from his decline.  Visible voluntary and spontaneous movement improved after getting pt involved in sitting and standing tasks.  Follow Up Recommendations  SNF;Supervision/Assistance - 24 hour     Equipment Recommendations  Rolling walker with 5" wheels    Recommendations for Other Services       Precautions / Restrictions Precautions Precautions: Fall    Mobility  Bed Mobility Overal bed mobility: Needs Assistance Bed Mobility: Supine to Sit;Sit to Supine     Supine to sit: Max assist Sit to supine: +2 for physical assistance;Max assist   General bed mobility comments: maximal assist through out movement.  But more voluntary movement after sitting and standing trials  Transfers Overall transfer level: Needs assistance   Transfers: Sit to/from Stand Sit to Stand: +2 physical assistance;Max assist         General transfer comment: cues for technique and to position feet. heavy assist to stand. Unable to take steps.  Ambulation/Gait                 Stairs            Wheelchair Mobility    Modified Rankin (Stroke Patients Only) Modified Rankin (Stroke Patients Only) Pre-Morbid Rankin Score: No significant disability Modified Rankin: Severe disability     Balance Overall balance assessment: Needs assistance Sitting-balance support: Feet supported;Single extremity supported Sitting balance-Leahy Scale: Poor Sitting balance - Comments: worked ~8 min on activating trunk and stretches.  pt was able to slow posterior lean, but not hold midline.     Standing balance-Leahy Scale: Poor Standing  balance comment: standing trials x2 working on upright posture, knee control, balance and w/shift.  Pt able to unweight L LE, but not R LE.                    Cognition Arousal/Alertness: Awake/alert Behavior During Therapy: Flat affect Overall Cognitive Status: Impaired/Different from baseline Area of Impairment: Following commands;Problem solving;Safety/judgement       Following Commands: Follows one step commands with increased time     Problem Solving: Slow processing;Decreased initiation;Requires verbal cues;Requires tactile cues      Exercises General Exercises - Lower Extremity Heel Slides: AAROM;PROM;Both;10 reps;Supine Hip ABduction/ADduction: PROM;Both;5 reps    General Comments        Pertinent Vitals/Pain Pain Assessment: Faces Faces Pain Scale: Hurts little more Pain Location: left leg with AAROM    Home Living                      Prior Function            PT Goals (current goals can now be found in the care plan section) Acute Rehab PT Goals Patient Stated Goal: None stated PT Goal Formulation: Patient unable to participate in goal setting Time For Goal Achievement: 12/22/13 Potential to Achieve Goals: Fair Progress towards PT goals: Goals downgraded-see care plan    Frequency  Min 3X/week    PT Plan Current plan remains appropriate    Co-evaluation             End of  Session   Activity Tolerance: Patient tolerated treatment well;Patient limited by fatigue Patient left: in bed;with call bell/phone within reach;with nursing/sitter in room     Time: 9166-0600 PT Time Calculation (min) (ACUTE ONLY): 23 min  Charges:  $Therapeutic Activity: 23-37 mins                    G Codes:      Marius Betts, Tessie Fass 12/08/2013, 5:10 PM 12/08/2013  Donnella Sham, PT [phone]  (pager)
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
## 2013-12-08 NOTE — Progress Notes (Signed)
Progress Note from the Palliative Medicine Team at [facility]:  -family gathered at bedside  -continued conversation with patient and his family regarding his current medical situation, pain management  and overall holistic emotional support      Objective: Allergies  Allergen Reactions  . Fish Allergy Hives, Swelling and Rash  . Buprenorphine Hcl-Naloxone Hcl Other (See Comments)    "feel like going to die" jittery, trouble breathing, feels hot  . Shellfish Allergy Hives  . Benadryl [Diphenhydramine Hcl] Hives, Itching and Rash   Scheduled Meds: . cefTRIAXone (ROCEPHIN)  IV  2 g Intravenous Q12H  . dexamethasone  4 mg Intravenous 4 times per day  . famotidine  10 mg Oral BID  . feeding supplement (GLUCERNA SHAKE)  237 mL Oral BID BM  . fenofibrate  54 mg Oral Daily  . fluconazole  100 mg Per Tube Daily  . levETIRAcetam  500 mg Intravenous Q12H  . metronidazole  500 mg Intravenous Q6H  . mometasone-formoterol  2 puff Inhalation BID  . multivitamin with minerals  1 tablet Oral Daily  . neomycin-bacitracin-polymyxin   Topical BID  . nicotine  21 mg Transdermal Q24H  . pantoprazole (PROTONIX) IV  40 mg Intravenous QHS  . polyethylene glycol  17 g Oral BID   Continuous Infusions: . sodium chloride 20 mL/hr at 12/03/13 0805  . sodium chloride 20 mL/hr at 12/06/13 0400  . feeding supplement (JEVITY 1.2 CAL) 1,000 mL (12/08/13 1030)   PRN Meds:.acetaminophen, albuterol, clonazePAM, diclofenac sodium, hydrocortisone cream, hydrOXYzine, ipratropium-albuterol, labetalol, morphine injection, ondansetron **OR** ondansetron (ZOFRAN) IV, promethazine  BP 148/68 mmHg  Pulse 71  Temp(Src) 98.3 F (36.8 C) (Oral)  Resp 20  Ht 6\' 1"  (1.854 m)  Wt 55.611 kg (122 lb 9.6 oz)  BMI 16.18 kg/m2  SpO2 96%   PPS:30 % at best  Pain Score: denies presently  Pain Location intermit ant back and neck pain   Intake/Output Summary (Last 24 hours) at 12/08/13 1624 Last data filed  at 12/08/13 1500  Gross per 24 hour  Intake 1562.66 ml  Output   2000 ml  Net -437.34 ml       Physical Exam:  General: cachectic, weak HEENT: poor dentation, noted white exudate (on Diflucan) Neuro: alert and orietnd X3 Psych: with capacity, tearful at times discussing present situation     Labs: CBC    Component Value Date/Time   WBC 10.5 12/07/2013 0500   RBC 4.69 12/07/2013 0500   HGB 12.1* 12/07/2013 0500   HCT 38.0* 12/07/2013 0500   PLT 412* 12/07/2013 0500   MCV 81.0 12/07/2013 0500   MCH 25.8* 12/07/2013 0500   MCHC 31.8 12/07/2013 0500   RDW 15.7* 12/07/2013 0500   LYMPHSABS 1.4 12/07/2013 0500   MONOABS 0.6 12/07/2013 0500   EOSABS 0.0 12/07/2013 0500   BASOSABS 0.0 12/07/2013 0500    BMET    Component Value Date/Time   NA 136* 12/07/2013 0500   K 4.2 12/07/2013 0500   CL 103 12/07/2013 0500   CO2 19 12/07/2013 0500   GLUCOSE 138* 12/07/2013 0500   BUN 39* 12/07/2013 0500   CREATININE 1.08 12/07/2013 0500   CREATININE 0.89 12/08/2011 1043   CALCIUM 9.0 12/07/2013 0500   CALCIUM 9.1 10/28/2009 2118   GFRNONAA 75* 12/07/2013 0500   GFRAA 87* 12/07/2013 0500    CMP     Component Value Date/Time   NA 136* 12/07/2013 0500   K 4.2 12/07/2013 0500   CL  103 12/07/2013 0500   CO2 19 12/07/2013 0500   GLUCOSE 138* 12/07/2013 0500   BUN 39* 12/07/2013 0500   CREATININE 1.08 12/07/2013 0500   CREATININE 0.89 12/08/2011 1043   CALCIUM 9.0 12/07/2013 0500   CALCIUM 9.1 10/28/2009 2118   PROT 7.6 12/01/2013 0414   ALBUMIN 2.4* 12/01/2013 0414   AST 60* 12/01/2013 0414   ALT 34 12/01/2013 0414   ALKPHOS 73 12/01/2013 0414   BILITOT 0.4 12/01/2013 0414   GFRNONAA 75* 12/07/2013 0500   GFRAA 87* 12/07/2013 0500    1 Assessment and Plan:  1.  Pain management: patient continue to do well on short acting IV medications requiring only one prn dose in the last 15 hrs (last dose documented 1055).  He now has a panda tibe that can be utilized for  medications.  Recommend converting to Morphine IR 7.5 mg tablet via tube. Reassess for efficacy  I do not believe there will be any need for long acting agents.  His addiction will need to be addressed as an  OP in appropriate timing.  2.  Depression: recommend depression assessment and consider initiating anti-depressant if patient is in agreement    Time In Time Out Total Time Spent with Patient Total Overall Time  1130 1215 40 min 45 min    Greater than 50%  of this time was spent counseling and coordinating care related to the above assessment and plan.  Wadie Lessen NP  Palliative Medicine Team Team Phone # [phone] Pager [phone]  Discussed with Dr Deitra Mayo and Dr Denton Brick 1

## 2013-12-08 NOTE — Progress Notes (Signed)
[facility] 176. Dr. Sherrine Maples notified. Will continue to monitor patient.

## 2013-12-08 NOTE — Clinical Social Work Note (Signed)
Patient transferred from 2S13 to 5W25.  Contacted 2V50 CSW, gave handoff report, this CSW to sign off on patient.  Jones Broom. [place], MSW, [place] 12/08/2013 8:48 AM

## 2013-12-08 NOTE — Clinical Social Work Note (Signed)
Clinical Education officer, museum working on [employer] placement at [facility]. Patient does not have any bed offers. CSW awaiting a returned phone call from [facility] on acceptance.   CSW to follow for disposition.   Glendon Axe, MSW, LCSWA [phone] 12/08/2013 3:10 PM

## 2013-12-08 NOTE — Consult Note (Signed)
Time for Infectious Disease    Date of Admission:  11/30/2013   Total days of antibiotics 9         Patient Active Problem List   Diagnosis Date Noted  . Brain abscess     Priority: High  . Pneumonia 12/01/2013    Priority: High  . Malnutrition 12/01/2013  . Heroin abuse 12/01/2013  . Protein-calorie malnutrition, severe 12/01/2013  . Malignancy   . Dyspnea 09/26/2013  . Loss of weight 09/13/2013  . Dizziness 09/13/2013  . Cellulitis 06/12/2013  . H/O ETOH abuse sober ten years 06/11/2013  . Cellulitis of hand 06/11/2013  . Insect bite 09/26/2012  . Nausea, chronic  09/26/2012  . Pain in limb 05/03/2012  . Abnormality of gait 04/16/2012  . Genital warts 03/03/2012  . Near syncope 01/26/2012  . Vertigo 01/26/2012  . Fever 01/26/2012  . Elevated serum creatinine 12/08/2011  . Wound infection 12/08/2011  . Left hip pain 12/08/2011  . Tobacco abuse 11/04/2011  . 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 Carotid stenosis, bilateral 08/21/2011  . Chronic pain in left foot 02/20/2011  . Depression 09/25/2010  . Preventative health care 09/25/2010  . GERD (gastroesophageal reflux disease) 07/25/2010  . Anxiety state 08/28/2008  . SLEEP APNEA, OBSTRUCTIVE, MODERATE 04/06/2008  . HERNIATED LUMBAR DISK WITH RADICULOPATHY 04/06/2008  . DIABETES MELLITUS 02/10/2008  . Dyslipidemia 02/10/2008  . ERECTILE DYSFUNCTION 02/10/2008  . HYPERTENSION, BENIGN ESSENTIAL 02/10/2008  . Peripheral vascular disease 02/10/2008  . COPD (chronic obstructive pulmonary disease) 02/10/2008     . cefTRIAXone (ROCEPHIN)  IV  2 g Intravenous Q12H  . dexamethasone  4 mg Intravenous 4 times per day  . famotidine  10 mg Oral BID  . feeding supplement (GLUCERNA SHAKE)  237 mL Oral BID BM  . fenofibrate  54 mg Oral Daily  . fluconazole  100 mg Per Tube Daily  . levETIRAcetam  500 mg Intravenous Q12H  . metronidazole   500 mg Intravenous Q6H  . mometasone-formoterol  2 puff Inhalation BID  . multivitamin with minerals  1 tablet Oral Daily  . neomycin-bacitracin-polymyxin   Topical BID  . nicotine  21 mg Transdermal Q24H  . pantoprazole (PROTONIX) IV  40 mg Intravenous QHS  . polyethylene glycol  17 g Oral BID    Subjective: "I feel like I'm dying".  OBJECTIVE: Blood pressure 148/68, pulse 71, temperature 98.3 F (36.8 C), temperature source Oral, resp. rate 20, height 6\' 1"  (1.854 m), weight 122 lb 9.6 oz (55.611 kg), SpO2 96 %. General: very weak and slow to respond to questions Oral: many missing and broken teeth. Thrush noted yesterday. Lungs: diffuse rhonchi Cor: distant but regular S1 and S2 with no murmur heard Abdomen: soft, thin and flat  Lab Results Lab Results  Component Value Date   WBC 10.5 12/07/2013   HGB 12.1* 12/07/2013   HCT 38.0* 12/07/2013   MCV 81.0 12/07/2013   PLT 412* 12/07/2013    Lab Results  Component Value Date   CREATININE 1.08 12/07/2013   BUN 39* 12/07/2013   NA 136* 12/07/2013   K 4.2 12/07/2013   CL 103 12/07/2013   CO2 19 12/07/2013    Lab Results  Component Value Date   ALT 34 12/01/2013   AST 60* 12/01/2013   ALKPHOS 73 12/01/2013   BILITOT 0.4 12/01/2013     Microbiology: Recent Results (from the past 240  hour(s))  Urine culture     Status: None   Collection Time: 11/30/13 11:20 PM  Result Value Ref Range Status   Specimen Description URINE, CLEAN CATCH  Final   Special Requests NONE  Final   Culture  Setup Time   Final    12/01/2013 09:01 Performed at [place] Performed at [facility]   Final   Culture NO GROWTH Performed at [facility]   Final   Report Status 12/02/2013 FINAL  Final  Blood culture (routine x 2)     Status: None   Collection Time: 12/01/13 12:05 AM  Result Value Ref Range Status   Specimen Description BLOOD RIGHT ARM  Final   Special Requests BOTTLES  DRAWN AEROBIC AND ANAEROBIC 5CC  Final   Culture  Setup Time   Final    12/01/2013 04:15 Performed at [facility]    Culture   Final    NO GROWTH 5 DAYS Performed at [facility]    Report Status 12/07/2013 FINAL  Final  Blood culture (routine x 2)     Status: None   Collection Time: 12/01/13 12:08 AM  Result Value Ref Range Status   Specimen Description BLOOD RIGHT HAND  Final   Special Requests BOTTLES DRAWN AEROBIC AND ANAEROBIC 5CC  Final   Culture  Setup Time   Final    12/01/2013 04:15 Performed at [facility]    Culture   Final    NO GROWTH 5 DAYS Performed at [facility]    Report Status 12/07/2013 FINAL  Final  Culture, expectorated sputum-assessment     Status: None   Collection Time: 12/01/13  4:33 AM  Result Value Ref Range Status   Specimen Description SPUTUM  Final   Special Requests NONE  Final   Sputum evaluation   Final    MICROSCOPIC FINDINGS SUGGEST THAT THIS SPECIMEN IS NOT REPRESENTATIVE OF LOWER RESPIRATORY SECRETIONS. PLEASE RECOLLECT. CALLED TO [place] [phone] 12/01/13 [email or web address]    Report Status 12/01/2013 FINAL  Final  Anaerobic culture     Status: None (Preliminary result)   Collection Time: 12/05/13 10:31 PM  Result Value Ref Range Status   Specimen Description ABSCESS  Final   Special Requests   Final    BILATERAL ASPIRATION OF BRAIN ABSCESS SPECIMEN NO 1   Gram Stain   Final    MODERATE WBC PRESENT, PREDOMINANTLY PMN NO SQUAMOUS EPITHELIAL CELLS SEEN MODERATE GRAM POSITIVE COCCI IN PAIRS Performed at [facility]    Culture   Final    NO ANAEROBES ISOLATED; CULTURE IN PROGRESS FOR 5 DAYS Performed at [facility]    Report Status PENDING  Incomplete  Fungus Culture with Smear     Status: None (Preliminary result)   Collection Time: 12/05/13 10:31 PM  Result Value Ref Range Status   Specimen Description ABSCESS  Final   Special Requests   Final    BILATERAL ASPIRATION OF BRAIN  ABSCESS SPECIMEN NO 1   Fungal Smear   Final    NO YEAST OR FUNGAL ELEMENTS SEEN Performed at [facility]    Culture   Final    CULTURE IN PROGRESS FOR FOUR WEEKS Performed at [facility]    Report Status PENDING  Incomplete  AFB culture with smear     Status: None (Preliminary result)   Collection Time: 12/05/13 10:31 PM  Result Value Ref Range  Status   Specimen Description ABSCESS  Final   Special Requests   Final    BILATERAL ASPIRATION OF BRAIN ABSCESS SPECIMEN NO 1   Acid Fast Smear   Final    NO ACID FAST BACILLI SEEN Performed at [facility]    Culture   Final    CULTURE WILL BE EXAMINED FOR 6 WEEKS BEFORE ISSUING A FINAL REPORT Performed at [facility]    Report Status PENDING  Incomplete  Culture, routine-abscess     Status: None (Preliminary result)   Collection Time: 12/05/13 10:31 PM  Result Value Ref Range Status   Specimen Description ABSCESS  Final   Special Requests   Final    BILATERAL ASPIRATION OF BRAIN ABSCESS SPECIMEN NO 1   Gram Stain   Final    MODERATE WBC PRESENT, PREDOMINANTLY PMN NO SQUAMOUS EPITHELIAL CELLS SEEN MODERATE GRAM POSITIVE COCCI IN PAIRS Performed at [facility]    Culture   Final    NO GROWTH 1 DAY Performed at [facility]    Report Status PENDING  Incomplete  Anaerobic culture     Status: None (Preliminary result)   Collection Time: 12/05/13 10:37 PM  Result Value Ref Range Status   Specimen Description ABSCESS  Final   Special Requests   Final    BILATERAL ASPIRATION OF BRAIN ABSCESS SPECIMEN NO 2   Gram Stain   Final    ABUNDANT WBC PRESENT, PREDOMINANTLY PMN NO SQUAMOUS EPITHELIAL CELLS SEEN MODERATE GRAM POSITIVE COCCI IN PAIRS Performed at [facility]    Culture   Final    NO ANAEROBES ISOLATED; CULTURE IN PROGRESS FOR 5 DAYS Performed at [facility]    Report Status PENDING  Incomplete  Fungus Culture with Smear     Status: None  (Preliminary result)   Collection Time: 12/05/13 10:37 PM  Result Value Ref Range Status   Specimen Description OTHER  Final   Special Requests   Final    BILATERAL ASPIRATION OF BRAIN ABSCESS SPECIMEN NO 2   Fungal Smear   Final    NO YEAST OR FUNGAL ELEMENTS SEEN Performed at [facility]    Culture   Final    CULTURE IN PROGRESS FOR FOUR WEEKS Performed at [facility]    Report Status PENDING  Incomplete  Fungus Culture with Smear     Status: None (Preliminary result)   Collection Time: 12/05/13 10:37 PM  Result Value Ref Range Status   Specimen Description ABSCESS  Final   Special Requests   Final    BILATERAL ASPIRATION OF BRAIN ABSCESS SPECIMEN NO 3   Fungal Smear   Final    NO YEAST OR FUNGAL ELEMENTS SEEN Performed at [facility]    Culture   Final    CULTURE IN PROGRESS FOR FOUR WEEKS Performed at [facility]    Report Status PENDING  Incomplete  AFB culture with smear     Status: None (Preliminary result)   Collection Time: 12/05/13 10:37 PM  Result Value Ref Range Status   Specimen Description OTHER  Final   Special Requests   Final    BILATERAL ASPIRATION OF BRAIN ABSCESS SPECIMEN NO 2   Acid Fast Smear   Final    NO ACID FAST BACILLI SEEN Performed at [facility]    Culture   Final    CULTURE WILL BE EXAMINED FOR 6 WEEKS BEFORE ISSUING A FINAL REPORT Performed at  [facility]    Report Status PENDING  Incomplete  AFB culture with smear     Status: None (Preliminary result)   Collection Time: 12/05/13 10:37 PM  Result Value Ref Range Status   Specimen Description ABSCESS  Final   Special Requests   Final    BILATERAL ASPIRATION OF BRAIN ABSCESS SPECIMEN NO 3   Acid Fast Smear   Final    NO ACID FAST BACILLI SEEN Performed at [facility]    Culture   Final    CULTURE WILL BE EXAMINED FOR 6 WEEKS BEFORE ISSUING A FINAL REPORT Performed at [facility]    Report Status PENDING   Incomplete  Culture, routine-abscess     Status: None (Preliminary result)   Collection Time: 12/05/13 10:37 PM  Result Value Ref Range Status   Specimen Description ABSCESS  Final   Special Requests   Final    BILATERAL ASPIRATION OF BRAIN ABSCESS SPECIMEN NO 2   Gram Stain   Final    ABUNDANT WBC PRESENT, PREDOMINANTLY PMN NO SQUAMOUS EPITHELIAL CELLS SEEN MODERATE GRAM POSITIVE COCCI IN PAIRS Performed at [facility]    Culture   Final    FEW MICROAEROPHILIC STREPTOCOCCI Note: Standardized susceptibility testing for this organism is not available. Performed at [facility]    Report Status PENDING  Incomplete  Culture, routine-abscess     Status: None (Preliminary result)   Collection Time: 12/05/13 10:37 PM  Result Value Ref Range Status   Specimen Description ABSCESS  Final   Special Requests   Final    BILATERAL ASPIRATION OF BRAIN ABSCESS SPECIMEN NO 3   Gram Stain   Final    ABUNDANT WBC PRESENT, PREDOMINANTLY PMN NO SQUAMOUS EPITHELIAL CELLS SEEN MODERATE GRAM POSITIVE COCCI IN PAIRS Performed at [facility]    Culture   Final    MODERATE MICROAEROPHILIC STREPTOCOCCI Note: Standardized susceptibility testing for this organism is not available. Performed at [facility]    Report Status PENDING  Incomplete  Anaerobic culture     Status: None (Preliminary result)   Collection Time: 12/05/13 10:37 PM  Result Value Ref Range Status   Specimen Description ABSCESS  Final   Special Requests BILATERAL ASPIRATION OF BRAIN ABSCESS  Final   Gram Stain   Final    ABUNDANT WBC PRESENT, PREDOMINANTLY PMN NO SQUAMOUS EPITHELIAL CELLS SEEN MODERATE GRAM POSITIVE COCCI IN PAIRS Performed at [facility]    Culture   Final    NO ANAEROBES ISOLATED; CULTURE IN PROGRESS FOR 5 DAYS Performed at [facility]    Report Status PENDING  Incomplete    Assessment: 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 It is unlikely that MRSA is part of the mix so I will stop vancomycin and continue ceftriaxone and metronidazole long-term.  Plan: 1. Continue ceftriaxone and metronidazole 2. Continue fluconazole 3. Discontinue vancomycin 4. Please call me for questions over the weekend  Michel Bickers, MD [facility] [place] [phone] pager   [phone] cell 12/08/2013, 4:50 PM

## 2013-12-08 NOTE — Progress Notes (Signed)
Subjective: Pt states he feels " sick."  Son [name] and sitter at bedside.    Objective: Vital signs in last 24 hours: Filed Vitals:   12/07/13 2100 12/08/13 0358 12/08/13 0819 12/08/13 0900  BP: 145/81 136/80 158/80   Pulse: 90 80 96   Temp: 98.3 F (36.8 C) 97.7 F (36.5 C)    TempSrc: Oral Axillary    Resp: 20 20 16    Height:      Weight:  122 lb 9.6 oz (55.611 kg)    SpO2: 97% 100% 95% 95%   Weight change: -7 lb 6.4 oz (-3.357 kg)  Intake/Output Summary (Last 24 hours) at 12/08/13 1014 Last data filed at 12/08/13 0800  Gross per 24 hour  Intake 1518.99 ml  Output   1625 ml  Net -106.01 ml   PE General: NAD, cachetic   HEENT: NG tube in rt nare in place Lungs: CTAB on anterior auscultation  Cardiac: RRR, S1/S2 BH:ALPF, scaphoid abdomen, active bowel sounds Ext: unable to plantar flex b/l, SCDs b/l, unable to grip left hand. Able to feel pinching of toes b/l  Lab Results: Basic Metabolic Panel:  Recent Labs Lab 12/06/13 0254 12/07/13 0500  NA 134* 136*  K 4.4 4.2  CL 101 103  CO2 18* 19  GLUCOSE 115* 138*  BUN 28* 39*  CREATININE 0.99 1.08  CALCIUM 8.9 9.0  MG  --  2.4  PHOS  --  4.0   CBC:  Recent Labs Lab 12/06/13 0254 12/07/13 0500  WBC 10.8* 10.5  NEUTROABS 8.5* 8.5*  HGB 12.5* 12.1*  HCT 38.9* 38.0*  MCV 80.9 81.0  PLT 445* 412*   Cardiac Enzymes:  Recent Labs Lab 12/05/13 1010 12/05/13 1520 12/05/13 2336  TROPONINI <0.30 <0.30 <0.30   CBG:  Recent Labs Lab 12/07/13 1204 12/07/13 1656 12/07/13 1944 12/07/13 2344 12/08/13 0356 12/08/13 0811  GLUCAP 135* 148* 143* 167* 176* 153*   Urine Drug Screen: Drugs of Abuse     Component Value Date/Time   LABOPIA POSITIVE* 12/01/2013 0533   LABOPIA NEG 06/01/2011 1115   COCAINSCRNUR NONE DETECTED 12/01/2013 0533   COCAINSCRNUR NEG 06/01/2011 1115   LABBENZ NONE DETECTED 12/01/2013 0533   LABBENZ NEG 06/01/2011 1115   LABBENZ NEG 02/18/2009 2050   AMPHETMU NONE DETECTED  12/01/2013 0533   AMPHETMU NEG 06/01/2011 1115   AMPHETMU NEG 02/18/2009 2050   THCU NONE DETECTED 12/01/2013 0533   THCU NEG 06/01/2011 1115   LABBARB NONE DETECTED 12/01/2013 0533   LABBARB NEG 06/01/2011 1115      Micro Results: Recent Results (from the past 240 hour(s))  Urine culture     Status: None   Collection Time: 11/30/13 11:20 PM  Result Value Ref Range Status   Specimen Description URINE, CLEAN CATCH  Final   Special Requests NONE  Final   Culture  Setup Time   Final    12/01/2013 09:01 Performed at [place] Performed at [facility]   Final   Culture NO GROWTH Performed at [facility]   Final   Report Status 12/02/2013 FINAL  Final  Blood culture (routine x 2)     Status: None   Collection Time: 12/01/13 12:05 AM  Result Value Ref Range Status   Specimen Description BLOOD RIGHT ARM  Final   Special Requests BOTTLES DRAWN AEROBIC AND ANAEROBIC 5CC  Final   Culture  Setup Time   Final  12/01/2013 04:15 Performed at [place]   Final    NO GROWTH 5 DAYS Performed at [facility]    Report Status 12/07/2013 FINAL  Final  Blood culture (routine x 2)     Status: None   Collection Time: 12/01/13 12:08 AM  Result Value Ref Range Status   Specimen Description BLOOD RIGHT HAND  Final   Special Requests BOTTLES DRAWN AEROBIC AND ANAEROBIC 5CC  Final   Culture  Setup Time   Final    12/01/2013 04:15 Performed at [facility]    Culture   Final    NO GROWTH 5 DAYS Performed at [facility]    Report Status 12/07/2013 FINAL  Final  Culture, expectorated sputum-assessment     Status: None   Collection Time: 12/01/13  4:33 AM  Result Value Ref Range Status   Specimen Description SPUTUM  Final   Special Requests NONE  Final   Sputum evaluation   Final    MICROSCOPIC FINDINGS SUGGEST THAT THIS SPECIMEN IS NOT REPRESENTATIVE OF LOWER RESPIRATORY SECRETIONS.  PLEASE RECOLLECT. CALLED TO [facility] [phone] 12/01/13 E.GADDY    Report Status 12/01/2013 FINAL  Final  Anaerobic culture     Status: None (Preliminary result)   Collection Time: 12/05/13 10:31 PM  Result Value Ref Range Status   Specimen Description ABSCESS  Final   Special Requests   Final    BILATERAL ASPIRATION OF BRAIN ABSCESS SPECIMEN NO 1   Gram Stain   Final    MODERATE WBC PRESENT, PREDOMINANTLY PMN NO SQUAMOUS EPITHELIAL CELLS SEEN MODERATE GRAM POSITIVE COCCI IN PAIRS Performed at [facility]    Culture   Final    NO ANAEROBES ISOLATED; CULTURE IN PROGRESS FOR 5 DAYS Performed at [facility]    Report Status PENDING  Incomplete  Fungus Culture with Smear     Status: None (Preliminary result)   Collection Time: 12/05/13 10:31 PM  Result Value Ref Range Status   Specimen Description ABSCESS  Final   Special Requests   Final    BILATERAL ASPIRATION OF BRAIN ABSCESS SPECIMEN NO 1   Fungal Smear   Final    NO YEAST OR FUNGAL ELEMENTS SEEN Performed at [facility]    Culture   Final    CULTURE IN PROGRESS FOR FOUR WEEKS Performed at [facility]    Report Status PENDING  Incomplete  AFB culture with smear     Status: None (Preliminary result)   Collection Time: 12/05/13 10:31 PM  Result Value Ref Range Status   Specimen Description ABSCESS  Final   Special Requests   Final    BILATERAL ASPIRATION OF BRAIN ABSCESS SPECIMEN NO 1   Acid Fast Smear   Final    NO ACID FAST BACILLI SEEN Performed at [facility]    Culture   Final    CULTURE WILL BE EXAMINED FOR 6 WEEKS BEFORE ISSUING A FINAL REPORT Performed at [facility]    Report Status PENDING  Incomplete  Culture, routine-abscess     Status: None (Preliminary result)   Collection Time: 12/05/13 10:31 PM  Result Value Ref Range Status   Specimen Description ABSCESS  Final   Special Requests   Final    BILATERAL ASPIRATION OF BRAIN ABSCESS SPECIMEN NO 1     Gram Stain   Final    MODERATE WBC PRESENT, PREDOMINANTLY PMN NO SQUAMOUS EPITHELIAL CELLS SEEN  MODERATE GRAM POSITIVE COCCI IN PAIRS Performed at [facility]    Culture   Final    NO GROWTH 1 DAY Performed at [facility]    Report Status PENDING  Incomplete  Anaerobic culture     Status: None (Preliminary result)   Collection Time: 12/05/13 10:37 PM  Result Value Ref Range Status   Specimen Description ABSCESS  Final   Special Requests   Final    BILATERAL ASPIRATION OF BRAIN ABSCESS SPECIMEN NO 2   Gram Stain   Final    ABUNDANT WBC PRESENT, PREDOMINANTLY PMN NO SQUAMOUS EPITHELIAL CELLS SEEN MODERATE GRAM POSITIVE COCCI IN PAIRS Performed at [facility]    Culture   Final    NO ANAEROBES ISOLATED; CULTURE IN PROGRESS FOR 5 DAYS Performed at [facility]    Report Status PENDING  Incomplete  Fungus Culture with Smear     Status: None (Preliminary result)   Collection Time: 12/05/13 10:37 PM  Result Value Ref Range Status   Specimen Description OTHER  Final   Special Requests   Final    BILATERAL ASPIRATION OF BRAIN ABSCESS SPECIMEN NO 2   Fungal Smear   Final    NO YEAST OR FUNGAL ELEMENTS SEEN Performed at [facility]    Culture   Final    CULTURE IN PROGRESS FOR FOUR WEEKS Performed at [facility]    Report Status PENDING  Incomplete  Fungus Culture with Smear     Status: None (Preliminary result)   Collection Time: 12/05/13 10:37 PM  Result Value Ref Range Status   Specimen Description ABSCESS  Final   Special Requests   Final    BILATERAL ASPIRATION OF BRAIN ABSCESS SPECIMEN NO 3   Fungal Smear   Final    NO YEAST OR FUNGAL ELEMENTS SEEN Performed at [facility]    Culture   Final    CULTURE IN PROGRESS FOR FOUR WEEKS Performed at [facility]    Report Status PENDING  Incomplete  AFB culture with smear     Status: None (Preliminary result)   Collection Time: 12/05/13 10:37 PM   Result Value Ref Range Status   Specimen Description OTHER  Final   Special Requests   Final    BILATERAL ASPIRATION OF BRAIN ABSCESS SPECIMEN NO 2   Acid Fast Smear   Final    NO ACID FAST BACILLI SEEN Performed at [facility]    Culture   Final    CULTURE WILL BE EXAMINED FOR 6 WEEKS BEFORE ISSUING A FINAL REPORT Performed at [facility]    Report Status PENDING  Incomplete  AFB culture with smear     Status: None (Preliminary result)   Collection Time: 12/05/13 10:37 PM  Result Value Ref Range Status   Specimen Description ABSCESS  Final   Special Requests   Final    BILATERAL ASPIRATION OF BRAIN ABSCESS SPECIMEN NO 3   Acid Fast Smear   Final    NO ACID FAST BACILLI SEEN Performed at [facility]    Culture   Final    CULTURE WILL BE EXAMINED FOR 6 WEEKS BEFORE ISSUING A FINAL REPORT Performed at [facility]    Report Status PENDING  Incomplete  Culture, routine-abscess     Status: None (Preliminary result)   Collection Time: 12/05/13 10:37 PM  Result Value Ref Range Status   Specimen Description ABSCESS  Final  Special Requests   Final    BILATERAL ASPIRATION OF BRAIN ABSCESS SPECIMEN NO 2   Gram Stain   Final    ABUNDANT WBC PRESENT, PREDOMINANTLY PMN NO SQUAMOUS EPITHELIAL CELLS SEEN MODERATE GRAM POSITIVE COCCI IN PAIRS Performed at [facility]    Culture   Final    FEW MICROAEROPHILIC STREPTOCOCCI Note: Standardized susceptibility testing for this organism is not available. Performed at [facility]    Report Status PENDING  Incomplete  Culture, routine-abscess     Status: None (Preliminary result)   Collection Time: 12/05/13 10:37 PM  Result Value Ref Range Status   Specimen Description ABSCESS  Final   Special Requests   Final    BILATERAL ASPIRATION OF BRAIN ABSCESS SPECIMEN NO 3   Gram Stain   Final    ABUNDANT WBC PRESENT, PREDOMINANTLY PMN NO SQUAMOUS EPITHELIAL CELLS SEEN MODERATE GRAM  POSITIVE COCCI IN PAIRS Performed at [facility]    Culture   Final    MODERATE MICROAEROPHILIC STREPTOCOCCI Note: Standardized susceptibility testing for this organism is not available. Performed at [facility]    Report Status PENDING  Incomplete  Anaerobic culture     Status: None (Preliminary result)   Collection Time: 12/05/13 10:37 PM  Result Value Ref Range Status   Specimen Description ABSCESS  Final   Special Requests BILATERAL ASPIRATION OF BRAIN ABSCESS  Final   Gram Stain   Final    ABUNDANT WBC PRESENT, PREDOMINANTLY PMN NO SQUAMOUS EPITHELIAL CELLS SEEN MODERATE GRAM POSITIVE COCCI IN PAIRS Performed at [facility]    Culture   Final    NO ANAEROBES ISOLATED; CULTURE IN PROGRESS FOR 5 DAYS Performed at [facility]    Report Status PENDING  Incomplete   Studies/Results: Dg Abd Portable 1v  12/06/2013   CLINICAL DATA:  Portable abdomen to assess for NG tube placement  EXAM: PORTABLE ABDOMEN - 1 VIEW  COMPARISON:  12/06/2013  FINDINGS: There is a feeding tube with tip in the projection of the proximal portion of the duodenum. The bowel gas pattern appears nonspecific. There are a few prominent loops of small bowel measuring up to 3.1 cm. Enteric contrast material is identified within pelvic small bowel loops and proximal colon.  IMPRESSION: The tip of the feeding tube is in the expected location of the proximal duodenum.   Electronically Signed   By: Kerby Moors M.D.   On: 12/06/2013 18:37   Dg Abd Portable 1v  12/06/2013   CLINICAL DATA:  Nasogastric tube placement.  Subsequent encounter.  EXAM: PORTABLE ABDOMEN - 1 VIEW  COMPARISON:  12/01/2013, 11/30/2013.  FINDINGS: Enteric tube is present with the tip in the proximal stomach. The side-port is in the distal esophagus. This should be advanced at least 7 cm to prevent gastroesophageal reflux. Airspace disease in the RIGHT middle lobe is again noted.  IMPRESSION: Enteric tube  should be advanced at least 7 cm to prevent gastroesophageal reflux.   Electronically Signed   By: Dereck Ligas M.D.   On: 12/06/2013 14:49   Medications: I have reviewed the patient's current medications. Scheduled Meds: . cefTRIAXone (ROCEPHIN)  IV  2 g Intravenous Q12H  . dexamethasone  4 mg Intravenous 4 times per day  . famotidine  10 mg Oral BID  . feeding supplement (GLUCERNA SHAKE)  237 mL Oral BID BM  . fenofibrate  54 mg Oral Daily  . fluconazole  100 mg Per Tube Daily  .  levETIRAcetam  500 mg Intravenous Q12H  . metronidazole  500 mg Intravenous Q6H  . mometasone-formoterol  2 puff Inhalation BID  . multivitamin with minerals  1 tablet Oral Daily  . neomycin-bacitracin-polymyxin   Topical BID  . nicotine  21 mg Transdermal Q24H  . pantoprazole (PROTONIX) IV  40 mg Intravenous QHS  . polyethylene glycol  17 g Oral BID   Continuous Infusions: . sodium chloride 20 mL/hr at 12/03/13 0805  . sodium chloride 20 mL/hr at 12/06/13 0400  . feeding supplement (JEVITY 1.2 CAL) 1,000 mL (12/08/13 0800)   PRN Meds:.acetaminophen, albuterol, clonazePAM, diclofenac sodium, hydrocortisone cream, hydrOXYzine, ipratropium-albuterol, labetalol, morphine injection, ondansetron **OR** ondansetron (ZOFRAN) IV, promethazine Assessment/Plan: Principal Problem:   Brain mass Active Problems:   Dyslipidemia   Anxiety state   HYPERTENSION, BENIGN ESSENTIAL   Peripheral vascular disease   COPD (chronic obstructive pulmonary disease)   Tobacco abuse   Abnormality of gait   Loss of weight   Malnutrition   Pneumonia   Heroin abuse   Protein-calorie malnutrition, severe   Malignancy   Rectal mass   Abnormal CT scan, pelvis   Brain abscess   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  - Decadron 4mg  q6h.  - keppra q12 added on 11/25 by Dr. Ellene Route - TEE cancelled 2/2 to pt's fragility  - Neuro checks - Dr. Baxter Flattery w/ ID following, recommend to start vanc, flagyl, and rocephin; day 5.D/c amp B as no yeast were seen on gram stain from biopsy - 3 days s/p brain abscess biopsy by Dr. Ellene Route; Anaerobic culture reveals GPC -  Will start to look into SNF placement that allows methadone ( awaiting palliative care consult note to provide further guidance regarding methadone administration), along with PICC line placement and NG tube - palliative care consulted for multiple medical conditions and methadone for pain. Mary from palliative care saw patient, currently awaiting recommendations. Do not want to try fentanyl even though this has worked for pt in the past as this will limit further pain medication options and is also short acting. Would prefer to give methadone for pain if possible.   Pharyngeal candidiasis-- pt received one dose of amp B on 11/24 - diflucan 100mg  x 21 days (end date 12/18)  Hyponatremia-- 2/2 cerebral salt wasting vs SIADH.  Na 136 today vs 134 yesterday -improving, will continue to monitor  Dysphagia- failed barium study, SLP following -  NG tube w/ nutrition following   Metastatic disease with cavitary Pneumonia: Was treated for PNA in 09/2013 with azithromycin, then had infiltrative finding on CXR in 10/2013. Has persistent infiltrate in RML suggestive of pneumonia. Has remote history of TB. - abx as noted above    Lower Extremity pain now w/ new weakness: Dopplers negative for DVT. MRI of cervical, thoracic, and lumbar spine w/ and w/o contrast neg for malignancy, minimal disc bulging noted along with chronic T4 compression fracture noted - morphine 3mg  q4h - will cont to monitor  Polysubstance Abuse: 50 pack year smoker who started using IV heroin about a year ago when his pain medications were not renewed. - nicotine  patch - Morphine 3mg  q4h, can increase to q3h if needed, no withdrawal since admission  - concern for wife possibly giving pt drugs;sons state it is  a possibility she may slip drugs. Sitter in the room w/ pt  COPD: Progressive emphysema; smoking history of 50 pack years. Had recent PFTs. Uses combivent and advair. - Continue home medications  Anxiety:  - Continue klonopin 0.5mg  BID   DMII: Stable and well-controlled on metformin. Hold metformin can start sliding scale sensitive if CBGs elevated - continue CBG checks q4  Essential Hypertension: normotensive. Will continue to monitor. Did not see any BP home meds listed.  DVT Ppx:  - SCDs  Diet:  - clear liquids per NG tube  Dispo: Disposition is deferred at this time, awaiting improvement of current medical problems. Anticipated discharge in approximately 5 day(s).   The patient does have a current PCP (Kelby Aline, MD) and does need an [facility] hospital follow-up appointment after discharge.  The patient does have transportation limitations that hinder transportation to clinic appointments.  .Services Needed at time of discharge: Y = Yes, Blank = No PT:   OT: SNF  RN:   Equipment:   Other:     LOS: 8 days   Julious Oka, MD 12/08/2013, 10:14 AM

## 2013-12-09 ENCOUNTER — Inpatient Hospital Stay (HOSPITAL_COMMUNITY): Payer: Medicaid Other

## 2013-12-09 LAB — GLUCOSE, CAPILLARY
GLUCOSE-CAPILLARY: 189 mg/dL — AB (ref 70–99)
GLUCOSE-CAPILLARY: 137 mg/dL — AB (ref 70–99)
Glucose-Capillary: 164 mg/dL — ABNORMAL HIGH (ref 70–99)
Glucose-Capillary: 167 mg/dL — ABNORMAL HIGH (ref 70–99)
Glucose-Capillary: 177 mg/dL — ABNORMAL HIGH (ref 70–99)
Glucose-Capillary: 115 mg/dL — ABNORMAL HIGH (ref 70–99)

## 2013-12-09 LAB — BASIC METABOLIC PANEL
ANION GAP: 14 (ref 5–15)
BUN: 41 mg/dL — ABNORMAL HIGH (ref 6–23)
CALCIUM: 9.3 mg/dL (ref 8.4–10.5)
CHLORIDE: 100 meq/L (ref 96–112)
CO2: 18 meq/L — AB (ref 19–32)
CREATININE: 0.78 mg/dL (ref 0.50–1.35)
GFR calc Af Amer: >90 mL/min (ref >90)
GFR calc non Af Amer: >90 mL/min (ref >90)
GLUCOSE: 214 mg/dL — AB (ref 70–99)
Potassium: 5.7 mEq/L — ABNORMAL HIGH (ref 3.7–5.3)
Sodium: 132 mEq/L — ABNORMAL LOW (ref 137–147)

## 2013-12-09 LAB — CULTURE, ROUTINE-ABSCESS

## 2013-12-09 MED ORDER — MORPHINE SULFATE 15 MG PO TABS
7.5000 mg | ORAL_TABLET | ORAL | Status: DC | PRN
  Administered 2013-12-10 – 2013-12-13 (×14): 7.5 mg via ORAL
  Filled 2013-12-09 (×15): qty 1

## 2013-12-09 MED ORDER — SODIUM POLYSTYRENE SULFONATE 15 GM/60ML PO SUSP
15.0000 g | Freq: Once | ORAL | Status: AC
  Administered 2013-12-09: 15 g
  Filled 2013-12-09: qty 60

## 2013-12-09 MED ORDER — MORPHINE SULFATE 2 MG/ML IJ SOLN
1.0000 mg | Freq: Once | INTRAMUSCULAR | Status: AC
  Administered 2013-12-09: 1 mg via INTRAVENOUS
  Filled 2013-12-09: qty 1

## 2013-12-09 MED ORDER — INSULIN ASPART 100 UNIT/ML ~~LOC~~ SOLN
0.0000 [IU] | Freq: Three times a day (TID) | SUBCUTANEOUS | Status: DC
Start: 2013-12-09 — End: 2013-12-13
  Administered 2013-12-09: 2 [IU] via SUBCUTANEOUS
  Administered 2013-12-10 (×3): 1 [IU] via SUBCUTANEOUS
  Administered 2013-12-11: 2 [IU] via SUBCUTANEOUS
  Administered 2013-12-11: 1 [IU] via SUBCUTANEOUS
  Administered 2013-12-11 – 2013-12-12 (×2): 2 [IU] via SUBCUTANEOUS
  Administered 2013-12-12 (×2): 1 [IU] via SUBCUTANEOUS
  Administered 2013-12-13: 5 [IU] via SUBCUTANEOUS

## 2013-12-09 NOTE — Progress Notes (Signed)
Subjective: Looks better today, also feels better today. No improvement. On tube feeds.    Objective: Vital signs in last 24 hours: Filed Vitals:   12/08/13 2014 12/08/13 2115 12/09/13 0500 12/09/13 0714  BP:  136/84  153/84  Pulse:  74  77  Temp:  97.9 F (36.6 C)  97.5 F (36.4 C)  TempSrc:  Oral  Oral  Resp:  20  21  Height:      Weight:   123 lb 1.6 oz (55.838 kg)   SpO2: 94% 94%  96%   Weight change: 8 oz (0.227 kg)  Intake/Output Summary (Last 24 hours) at 12/09/13 1211 Last data filed at 12/09/13 0537  Gross per 24 hour  Intake      0 ml  Output   1700 ml  Net  -1700 ml   Physical Exam-  General: NAD, cachetic, eyes appear sunken.   HEENT: NG tube in rt nare in place Lungs: On added sounds  Cardiac: RRR, S1/S2 ZR:AQTM, scaphoid abdomen, active bowel sounds Ext: Pt did not move extrem- lower, and did not left upper extremity, but has normal tone- lower extrem.  Lab Results: Basic Metabolic Panel:  Recent Labs Lab 12/07/13 0500 12/09/13 0414  NA 136* 132*  K 4.2 5.7*  CL 103 100  CO2 19 18*  GLUCOSE 138* 214*  BUN 39* 41*  CREATININE 1.08 0.78  CALCIUM 9.0 9.3  MG 2.4  --   PHOS 4.0  --    CBC:  Recent Labs Lab 12/06/13 0254 12/07/13 0500  WBC 10.8* 10.5  NEUTROABS 8.5* 8.5*  HGB 12.5* 12.1*  HCT 38.9* 38.0*  MCV 80.9 81.0  PLT 445* 412*   Cardiac Enzymes:  Recent Labs Lab 12/05/13 1010 12/05/13 1520 12/05/13 2336  TROPONINI <0.30 <0.30 <0.30   CBG:  Recent Labs Lab 12/08/13 1645 12/08/13 2106 12/09/13 0027 12/09/13 0413 12/09/13 0741 12/09/13 1140  GLUCAP 198* 122* 164* 189* 167* 137*   Urine Drug Screen: Drugs of Abuse     Component Value Date/Time   LABOPIA POSITIVE* 12/01/2013 0533   LABOPIA NEG 06/01/2011 1115   COCAINSCRNUR NONE DETECTED 12/01/2013 0533   COCAINSCRNUR NEG 06/01/2011 1115   LABBENZ NONE DETECTED 12/01/2013 0533   LABBENZ NEG 06/01/2011 1115   LABBENZ NEG 02/18/2009 2050   AMPHETMU NONE  DETECTED 12/01/2013 0533   AMPHETMU NEG 06/01/2011 1115   AMPHETMU NEG 02/18/2009 2050   THCU NONE DETECTED 12/01/2013 0533   THCU NEG 06/01/2011 1115   LABBARB NONE DETECTED 12/01/2013 0533   LABBARB NEG 06/01/2011 1115    Medications: I have reviewed the patient's current medications. Scheduled Meds: . cefTRIAXone (ROCEPHIN)  IV  2 g Intravenous Q12H  . dexamethasone  4 mg Intravenous 4 times per day  . famotidine  10 mg Oral BID  . feeding supplement (GLUCERNA SHAKE)  237 mL Oral BID BM  . fenofibrate  54 mg Oral Daily  . fluconazole  100 mg Per Tube Daily  . levETIRAcetam  500 mg Intravenous Q12H  . metronidazole  500 mg Intravenous Q6H  . mometasone-formoterol  2 puff Inhalation BID  . multivitamin with minerals  1 tablet Oral Daily  . neomycin-bacitracin-polymyxin   Topical BID  . nicotine  21 mg Transdermal Q24H  . pantoprazole (PROTONIX) IV  40 mg Intravenous QHS  . polyethylene glycol  17 g Oral BID   Continuous Infusions: . sodium chloride 20 mL/hr at 12/03/13 0805  . sodium chloride 20 mL/hr at 12/06/13  0400  . 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 S/p post drainage of brain abscesses, seen on MRI brain. On IV antibiotics.  - Cont Decadron 4mg  q6h.  - keppra q12 added on 11/25 by Dr. Ellene Route - TEE cancelled 2/2 to pt's fragility  - Neuro checks- Routine - ID following, D/c Vanc, Cont flagyl- day 8, and rocephin- day 8.  - Per ID note- 11/25- Cancel EGD- as pt is too frail, and will treat for 8 weeks total of IV antibiotics. - D/c amp B as no yeast were seen on gram stain from biopsy - Consulted Social work- For placement.   - Palliative care consulted for  multiple medical conditions and methadone for pain. Recommend Oral Pain meds- Morphine. Pt is no longer asking for pain meds.  Pharyngeal candidiasis-- pt received one dose of amp B on 11/24. - Diflucan 100mg  x 21 days (end date 12/18)  Hyponatremia-- 2/2 cerebral salt wasting vs SIADH.  Na 136 today vs 134 yesterday -improving, will continue to monitor  Dysphagia- failed barium study, SLP following -  NG tube w/ nutrition following   Metastatic disease with cavitary Pneumonia: Was treated for PNA in 09/2013 with azithromycin, then had infiltrative finding on CXR in 10/2013. Has persistent infiltrate in RML suggestive of pneumonia. Has remote history of TB. - abx as noted above    Lower Extremity pain now w/ new weakness: Dopplers negative for DVT. MRI of cervical, thoracic, and lumbar spine w/ and w/o contrast neg for malignancy, minimal disc bulging noted along with chronic T4 compression fracture noted. Weakness likely related to edema due to abcesses. Seen on CT head after procedure.   Polysubstance Abuse: 50 pack year smoker who started using IV heroin about a year ago when his pain medications were not renewed. - nicotine patch - Morphine 7.5 mg q4h PRN.  - concern for wife possibly giving pt drugs;sons state it is a possibility she may slip drugs. Sitter in the room w/ pt  COPD: Progressive emphysema; smoking history of 50 pack years. Had recent PFTs. Uses combivent and advair. - Continue home medications  Anxiety:  - Continue klonopin 0.5mg  BID   DMII: Stable and well-controlled on metformin. Hold metformin can start sliding scale sensitive if CBGs elevated - continue CBG checks q4  Essential Hypertension: normotensive. Will continue to monitor. Did not see any BP home meds listed.  DVT Ppx:  - SCDs  Diet:  - Tube feeds  Dispo: Disposition is deferred at this time, awaiting improvement of current medical problems. Anticipated discharge in approximately 3-4 day(s).  Pending SNF placement.  The patient does have a current PCP (Kelby Aline, MD) and does need an [facility] hospital follow-up appointment after discharge.  The patient does have transportation limitations that hinder transportation to clinic appointments.  .Services Needed at time of discharge: Y = Yes, Blank = No PT:   OT: SNF  RN:   Equipment:   Other:     LOS: 9 days   Bethena Roys, MD 12/09/2013, 12:11 PM

## 2013-12-09 NOTE — Progress Notes (Signed)
Patient ID: [name], male   DOB: Dec 05, 1960, 53 y.o.   MRN: [id number] Medicine attending: I personally interviewed and examined this patient together with resident physician Dr. Denton Brick and I concur with her evaluation and management plan. The patient is much more interactive and less toxic appearing today. He appears to be responding to antibiotics for multiple brain abscesses. Posterior pharyngeal exudate significantly decreased on Diflucan. He still has no motor strength of his lower extremities and decreased strength in his left upper extremity. We will get physical and occupational therapy involved. Anticipate transfer to an extended care facility next week if otherwise stable.

## 2013-12-09 NOTE — Progress Notes (Signed)
MD called about K 5.7. New order received and implemented for kayexalate. Will continue to monitor.

## 2013-12-10 ENCOUNTER — Inpatient Hospital Stay (HOSPITAL_COMMUNITY): Payer: Medicaid Other

## 2013-12-10 LAB — GLUCOSE, CAPILLARY
GLUCOSE-CAPILLARY: 179 mg/dL — AB (ref 70–99)
Glucose-Capillary: 154 mg/dL — ABNORMAL HIGH (ref 70–99)
Glucose-Capillary: 136 mg/dL — ABNORMAL HIGH (ref 70–99)
Glucose-Capillary: 121 mg/dL — ABNORMAL HIGH (ref 70–99)
Glucose-Capillary: 140 mg/dL — ABNORMAL HIGH (ref 70–99)

## 2013-12-10 LAB — BASIC METABOLIC PANEL
Anion gap: 15 (ref 5–15)
BUN: 42 mg/dL — ABNORMAL HIGH (ref 6–23)
CO2: 22 meq/L (ref 19–32)
Calcium: 9.4 mg/dL (ref 8.4–10.5)
Chloride: 100 mEq/L (ref 96–112)
Creatinine, Ser: 0.76 mg/dL (ref 0.50–1.35)
GFR calc Af Amer: >90 mL/min (ref >90)
GFR calc non Af Amer: >90 mL/min (ref >90)
Glucose, Bld: 155 mg/dL — ABNORMAL HIGH (ref 70–99)
Potassium: 4.8 mEq/L (ref 3.7–5.3)
SODIUM: 137 meq/L (ref 137–147)

## 2013-12-10 LAB — CBC
HCT: 40.6 % (ref 39.0–52.0)
Hemoglobin: 13.1 g/dL (ref 13.0–17.0)
MCH: 26.3 pg (ref 26.0–34.0)
MCHC: 32.3 g/dL (ref 30.0–36.0)
MCV: 81.5 fL (ref 78.0–100.0)
Platelets: 391 10*3/uL (ref 150–400)
RBC: 4.98 MIL/uL (ref 4.22–5.81)
RDW: 15.8 % — ABNORMAL HIGH (ref 11.5–15.5)
WBC: 7.7 10*3/uL (ref 4.0–10.5)

## 2013-12-10 MED ORDER — ZOLPIDEM TARTRATE 5 MG PO TABS: 5.0000 mg | ORAL_TABLET | Freq: Once | ORAL | Status: DC

## 2013-12-10 MED ORDER — MORPHINE SULFATE 2 MG/ML IJ SOLN
1.0000 mg | Freq: Once | INTRAMUSCULAR | Status: AC
  Administered 2013-12-10: 1 mg via INTRAVENOUS
  Filled 2013-12-10: qty 1

## 2013-12-10 MED ORDER — MORPHINE SULFATE 2 MG/ML IJ SOLN
2.0000 mg | Freq: Once | INTRAMUSCULAR | Status: AC
  Administered 2013-12-10: 2 mg via INTRAVENOUS
  Filled 2013-12-10: qty 1

## 2013-12-10 MED ORDER — MORPHINE SULFATE 2 MG/ML IJ SOLN
1.0000 mg | Freq: Once | INTRAMUSCULAR | Status: AC | PRN
  Administered 2013-12-10: 1 mg via INTRAVENOUS
  Filled 2013-12-10: qty 1

## 2013-12-10 NOTE — Progress Notes (Addendum)
Spoke with MD, Hulen Luster about x-ray results for placement. MD notified about NG tube location. NG tube location was stated on the x-ray to be located in "the mid stomach" .MD has given order to begin feeding the patient. Will continue to monitor.

## 2013-12-10 NOTE — Progress Notes (Signed)
Patient stated " I don't want that tube back down my nose. I want it surgically placed through my belly button. It hurts too bad." RN explained that without tube no pain medicine could be given at this time. Patient nodded "yes". Patient then asked charge RN for pain medicine stating he could not wait 4-5 hours to get pain medicine. On-call provider notified of patients desires for tube placement and requests for pain medication. New orders placed. Will continue to monitor.

## 2013-12-10 NOTE — Clinical Social Work Note (Signed)
CSW continues to follow for d/c planning needs. CSW met with patient on this date and discussed statewide SNF referral. Patient and son who was present at bedside were in agreement with referral. CSW to fax referral statewide to SNF. CSW to follow tomorrow.   [place], [place] Weekend Clinical Social Worker [phone]

## 2013-12-10 NOTE — Progress Notes (Signed)
On-call provider paged regarding CBG orders. Orders changed to Q4H CBG. Will continue to monitor.

## 2013-12-10 NOTE — Progress Notes (Signed)
NGT came out. No distress noted. On-call provider notified. Awaiting further orders. Will continue to monitor.
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
## 2013-12-10 NOTE — Progress Notes (Signed)
On-call provider notified that patients HR down into 40s. No acute distress at this time. Pt alert, oriented and resting comfortably. Will get 12-lead EKG as ordered, and continue to monitor.

## 2013-12-11 DIAGNOSIS — G09 Sequelae of inflammatory diseases of central nervous system: Secondary | ICD-10-CM

## 2013-12-11 DIAGNOSIS — G839 Paralytic syndrome, unspecified: Secondary | ICD-10-CM

## 2013-12-11 DIAGNOSIS — B3789 Other sites of candidiasis: Secondary | ICD-10-CM

## 2013-12-11 LAB — GLUCOSE, CAPILLARY
GLUCOSE-CAPILLARY: 160 mg/dL — AB (ref 70–99)
GLUCOSE-CAPILLARY: 144 mg/dL — AB (ref 70–99)
Glucose-Capillary: 194 mg/dL — ABNORMAL HIGH (ref 70–99)

## 2013-12-11 MED ORDER — SODIUM CHLORIDE 0.9 % IJ SOLN
10.0000 mL | INTRAMUSCULAR | Status: DC | PRN
  Administered 2013-12-12 – 2013-12-17 (×5): 10 mL
  Filled 2013-12-11 (×5): qty 40

## 2013-12-11 MED ORDER — LORAZEPAM 2 MG/ML IJ SOLN
1.0000 mg | Freq: Once | INTRAMUSCULAR | Status: AC
  Administered 2013-12-11: 1 mg via INTRAVENOUS
  Filled 2013-12-11: qty 1

## 2013-12-11 NOTE — Progress Notes (Signed)
OT Cancellation Note  Patient Details Name: [name] MRN: [id number] DOB: October 21, 1960   Cancelled Treatment:    Reason Eval/Treat Not Completed: Patient at procedure or test/ unavailable  Benito Mccreedy OTR/L [phone] 12/11/2013, 12:02 PM

## 2013-12-11 NOTE — Progress Notes (Signed)
Spoke with on-call provider regarding tube feed placement. Xray shows tube end positioned in mid stomach. Tube cannot be advanced due to stylet being removed previously. Provider instructed RN to stop tube feeds until am when am team can re-evaluate. Feeding stopped. Will continue to monitor.

## 2013-12-11 NOTE — Progress Notes (Signed)
Physical Therapy Treatment Patient Details Name: [name] MRN: [id number] DOB: 1960-11-13 Today's Date: 12/11/2013    History of Present Illness Patient is a 53 yo male admitted 11/30/13 with BLE pain and edema, with multiple falls.  Patient with h/o brain mass, CVA, PVD, COPD, CHF, IV heroin use, Hep C, CHF, DM.    PT Comments    Progressing slowly.  Much more voluntary movement in both UE's and L LE.  Improved sitting balance with trunk slowing list off midline.  R LE lags behind, but able to engage R LE in stance.  Follow Up Recommendations  SNF;Supervision/Assistance - 24 hour     Equipment Recommendations   (TBA next venue)    Recommendations for Other Services       Precautions / Restrictions Precautions Precautions: Fall    Mobility  Bed Mobility Overal bed mobility: Needs Assistance Bed Mobility: Rolling;Supine to Sit;Sit to Sidelying Rolling: Max assist   Supine to sit: Max assist   Sit to sidelying: Max assist General bed mobility comments: maximal assist through out movement.  But more voluntary movement after sitting and standing trials  Transfers Overall transfer level: Needs assistance Equipment used: Rolling walker (2 wheeled) Transfers: Sit to/from Stand Sit to Stand: Max assist;+2 physical assistance         General transfer comment: cues for hand placement, set up and assist to come forward more than liifting assist  Ambulation/Gait                 Stairs            Wheelchair Mobility    Modified Rankin (Stroke Patients Only) Modified Rankin (Stroke Patients Only) Pre-Morbid Rankin Score: No significant disability Modified Rankin: Severe disability     Balance Overall balance assessment: Needs assistance Sitting-balance support: Bilateral upper extremity supported Sitting balance-Leahy Scale: Poor Sitting balance - Comments: worked at [employer] to activate trunk and work for balance in midline.  Tried many techniques  and positions including popping on elbows in side sitting, guiding hands down toward shins, reaching forward.   Standing balance support: Bilateral upper extremity supported Standing balance-Leahy Scale: Poor Standing balance comment: 2 standing trials working on balance, knee control and w/shifts. Stood for 20 to 25 secs each before fatigue.                    Cognition Arousal/Alertness: Awake/alert Behavior During Therapy: Flat affect Overall Cognitive Status: Impaired/Different from baseline Area of Impairment: Following commands;Problem solving;Safety/judgement       Following Commands: Follows one step commands with increased time;Follows one step commands inconsistently Safety/Judgement: Decreased awareness of safety   Problem Solving: Slow processing;Decreased initiation;Requires verbal cues;Requires tactile cues      Exercises General Exercises - Lower Extremity Heel Slides: AAROM;Both;10 reps;Supine (resisted extension, active flexion L leg) Hip ABduction/ADduction: AAROM;Both;10 reps;Supine    General Comments        Pertinent Vitals/Pain Pain Assessment: Faces Faces Pain Scale: Hurts even more Pain Location: back and leg pain Pain Descriptors / Indicators: Grimacing Pain Intervention(s): Limited activity within patient's tolerance;Monitored during session    Home Living                      Prior Function            PT Goals (current goals can now be found in the care plan section) Acute Rehab PT Goals Patient Stated Goal: None stated PT Goal Formulation: Patient unable to  participate in goal setting Time For Goal Achievement: 12/22/13 Potential to Achieve Goals: Fair Progress towards PT goals: Progressing toward goals    Frequency  Min 3X/week    PT Plan Current plan remains appropriate    Co-evaluation             End of Session Equipment Utilized During Treatment: Gait belt Activity Tolerance: Patient tolerated treatment  well;Patient limited by fatigue Patient left: in bed;with call bell/phone within reach;with nursing/sitter in room     Time: 5974-1638 PT Time Calculation (min) (ACUTE ONLY): 36 min  Charges:  $Therapeutic Activity: 23-37 mins                    G Codes:      Aislyn Hayse, Tessie Fass 12/11/2013, 2:40 PM 12/11/2013  Donnella Sham, PT [phone]  (pager)

## 2013-12-11 NOTE — Plan of Care (Signed)
Problem: Phase III Progression Outcomes Goal: Pain controlled on oral analgesia Outcome: Progressing Goal: Voiding independently Outcome: Completed/Met Date Met:  12/11/13
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
## 2013-12-11 NOTE — Progress Notes (Signed)
[facility] for Infectious Disease    Date of Admission:  11/30/2013   Total days of antibiotics 11        Day 10 ceftriaxone        Day 10 metronidazole        Day 5 fluconazole   ID: [name] is a 53 y.o. 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 Replaced NG tube today. Patient feels poorly.  Medications:  . cefTRIAXone (ROCEPHIN)  IV  2 g Intravenous Q12H  . dexamethasone  4 mg Intravenous 4 times per day  . famotidine  10 mg Oral BID  . feeding supplement (GLUCERNA SHAKE)  237 mL Oral BID BM  . fenofibrate  54 mg Oral Daily  . fluconazole  100 mg Per Tube Daily  . insulin aspart  0-9 Units Subcutaneous TID WC  . levETIRAcetam  500 mg Intravenous Q12H  . metronidazole  500 mg Intravenous Q6H  . mometasone-formoterol  2 puff Inhalation BID  . multivitamin with minerals  1 tablet Oral Daily  . neomycin-bacitracin-polymyxin   Topical BID  . nicotine  21 mg Transdermal Q24H  . pantoprazole (PROTONIX) IV  40 mg Intravenous QHS  . polyethylene glycol  17 g Oral BID    Objective: Vital signs in last 24 hours: Temp:  [99.1 F (37.3 C)-100.1 F (37.8 C)] 99.1 F (37.3 C) (11/30 0504) Pulse Rate:  [80-82] 82 (11/30 0504) Resp:  [16] 16 (11/30 0504) BP: (126-137)/(67-75) 126/67 mmHg (11/30 0504) SpO2:  [96 %-99 %] 96 % (11/30 0923) Weight:  [115 lb 1.3 oz (52.2 kg)] 115 lb 1.3 oz (52.2 kg) (11/30 0504) gen = cachetic appearing, chronically ill appearing heent  = dry oral mucosa, bitemporal wasting, ng in place pulm = ctab Cors = nl s1,s2 abd = scaphoid, BS+ Neuro = dysarthric speech. 4/5 grip strength on left (improved), cognitive slowing but does slowly move left leg. Has some proximal upper warm weakness.   Lab Results  Recent Labs  12/09/13 0414 12/10/13 0423  WBC  --  7.7  HGB  --  13.1  HCT  --  40.6  NA 132* 137  K 5.7* 4.8  CL 100 100  CO2 18* 22  BUN 41* 42*  CREATININE 0.78 0.76   Liver Panel No results for input(s): PROT, ALBUMIN, AST, ALT, ALKPHOS, BILITOT, BILIDIR, IBILI in the last 72 hours. Sedimentation Rate No results for input(s): ESRSEDRATE in the last 72 hours. C-Reactive Protein No results for input(s): CRP in the last 72 hours.  Microbiology: 75/64 microaerophilic strep Studies/Results: Dg Abd Portable 1v  12/10/2013   CLINICAL DATA:  Feeding tube placement. The tube has been advanced since earlier today.  EXAM: PORTABLE ABDOMEN - 1 VIEW  COMPARISON:  Earlier today.  FINDINGS: The feeding tube has been advanced with its tip in the mid stomach. Gas-filled loops of colon and small bowel without dilatation. Mild scoliosis. Lumbar and  lower thoracic spine degenerative changes.  IMPRESSION: Feeding tube tip in the mid stomach.   Electronically Signed   By: Enrique Sack M.D.   On: 12/10/2013 17:07   Dg Abd Portable 1v  12/10/2013   CLINICAL DATA:  Encounter for imaging study to confirm nasogastric (NG) tube placement Z01.89 (ICD-10-CM)  EXAM: PORTABLE ABDOMEN - 1 VIEW  COMPARISON:  12/09/2013  FINDINGS: The feeding tube tip is in the lower chest, probably in the distal esophagus. Nonspecific bowel gas pattern.  IMPRESSION: Feeding tube tip in the distal esophagus region.   Electronically Signed   By: Markus Daft M.D.   On: 12/10/2013 15:28   Dg Abd Portable 1v  12/09/2013   CLINICAL DATA:  Evaluate feeding tube position  EXAM: PORTABLE ABDOMEN - 1 VIEW  COMPARISON:  Prior abdominal radiograph 12/06/2013  FINDINGS:  The distal progression of the weighted tip enteric feeding tube. The tip of the tube is now in the descending duodenum. No evidence of bowel obstruction. The lung bases are clear. Osseous structures are unremarkable.  IMPRESSION: 1. The weighted tip of the enteric feeding tube now projects over the descending duodenum. 2. No evidence of bowel obstruction.   Electronically Signed   By: Jacqulynn Cadet M.D.   On: 12/09/2013 22:32     Assessment/Plan: Brain abscess= will need 8 wk of Iv therapy and then convert to oral antibiotics. He will need ceftriaxone 2gm IV Q12h and metronidazole 500mg  Q8hr Iv. Can switch to oral metronidazole once he regains swallow function or if he decides to have peg. Discussed the need for peg if patient still fails his swallow function. Would recommend to pursue peg so that patient gets proper nutrition during period of rehab.  Based upon PT/OT/speech, can likely qualify for acute rehab.  Opiate dependence/addiction = appreciate palliative care help with management  Dr. Tommy Medal to provide further recs  [facility], [facility] Cell: [phone] Pager: [phone]  12/11/2013, 11:56 AM

## 2013-12-11 NOTE — Progress Notes (Signed)
Subjective: [name] NG tube reinserted yesterday, chest xray confirmed position in mid epigastrium. It was confirmed with nurse to resume tube feeds however this was not done overnight. He was given IV morphine for pain overnight instead of po morphine.   Has not had any BMs since yesterday. Pt complaining of back and buttock pain. Just received dose of po morphine prior to exam and pt was extremely lethargic. Would answer questions then eyes would roll back and pt would fall asleep.    Objective: Vital signs in last 24 hours: Filed Vitals:   12/10/13 2022 12/10/13 2031 12/11/13 0504 12/11/13 0923  BP:  137/75 126/67   Pulse:  80 82   Temp:  100.1 F (37.8 C) 99.1 F (37.3 C)   TempSrc:  Oral    Resp:  16 16   Height:      Weight:   115 lb 1.3 oz (52.2 kg)   SpO2: 99% 97% 96% 96%   Weight change: -2 lb 6.8 oz (-1.1 kg)  Intake/Output Summary (Last 24 hours) at 12/11/13 1128 Last data filed at 12/11/13 0900  Gross per 24 hour  Intake    800 ml  Output   1950 ml  Net  -1150 ml   PE General: NAD, cachetic Lungs: CTAB on anterior auscultation  Cardiac: RRR, S1/S2 KN:[id number], scaphoid abdomen, active bowel sounds GU: foley cath in place Ext: unable to plantar flex b/l, SCDs b/l, able to grip hands b/l, able to lift lt leg off bed, unable to lift rt leg of bed but would lift rt arm up in attempt to lift rt leg up.  Lab Results: Basic Metabolic Panel:  Recent Labs Lab 12/07/13 0500 12/09/13 0414 12/10/13 0423  NA 136* 132* 137  K 4.2 5.7* 4.8  CL 103 100 100  CO2 19 18* 22  GLUCOSE 138* 214* 155*  BUN 39* 41* 42*  CREATININE 1.08 0.78 0.76  CALCIUM 9.0 9.3 9.4  MG 2.4  --   --   PHOS 4.0  --   --    CBC:  Recent Labs Lab 12/06/13 0254 12/07/13 0500 12/10/13 0423  WBC 10.8* 10.5 7.7  NEUTROABS 8.5* 8.5*  --   HGB 12.5* 12.1* 13.1  HCT 38.9* 38.0* 40.6  MCV 80.9 81.0 81.5  PLT 445* 412* 391   Cardiac Enzymes:  Recent Labs Lab 12/05/13 1010  12/05/13 1520 12/05/13 2336  TROPONINI <0.30 <0.30 <0.30   CBG:  Recent Labs Lab 12/10/13 0409 12/10/13 0844 12/10/13 1304 12/10/13 1754 12/10/13 2148 12/11/13 0804  GLUCAP 154* 136* 121* 140* 179* 160*   Urine Drug Screen: Drugs of Abuse     Component Value Date/Time   LABOPIA POSITIVE* 12/01/2013 0533   LABOPIA NEG 06/01/2011 1115   COCAINSCRNUR NONE DETECTED 12/01/2013 0533   COCAINSCRNUR NEG 06/01/2011 1115   LABBENZ NONE DETECTED 12/01/2013 0533   LABBENZ NEG 06/01/2011 1115   LABBENZ NEG 02/18/2009 2050   AMPHETMU NONE DETECTED 12/01/2013 0533   AMPHETMU NEG 06/01/2011 1115   AMPHETMU NEG 02/18/2009 2050   THCU NONE DETECTED 12/01/2013 0533   THCU NEG 06/01/2011 1115   LABBARB NONE DETECTED 12/01/2013 0533   LABBARB NEG 06/01/2011 1115      Micro Results: Recent Results (from the past 240 hour(s))  Anaerobic culture     Status: None (Preliminary result)   Collection Time: 12/05/13 10:31 PM  Result Value Ref Range Status   Specimen Description ABSCESS  Final   Special Requests  Final    BILATERAL ASPIRATION OF BRAIN ABSCESS SPECIMEN NO 1   Gram Stain   Final    MODERATE WBC PRESENT, PREDOMINANTLY PMN NO SQUAMOUS EPITHELIAL CELLS SEEN MODERATE GRAM POSITIVE COCCI IN PAIRS Performed at [facility]    Culture   Final    NO ANAEROBES ISOLATED; CULTURE IN PROGRESS FOR 5 DAYS Performed at [facility]    Report Status PENDING  Incomplete  Fungus Culture with Smear     Status: None (Preliminary result)   Collection Time: 12/05/13 10:31 PM  Result Value Ref Range Status   Specimen Description ABSCESS  Final   Special Requests   Final    BILATERAL ASPIRATION OF BRAIN ABSCESS SPECIMEN NO 1   Fungal Smear   Final    NO YEAST OR FUNGAL ELEMENTS SEEN Performed at [facility]    Culture   Final    CULTURE IN PROGRESS FOR FOUR WEEKS Performed at [facility]    Report Status PENDING  Incomplete  AFB culture with smear      Status: None (Preliminary result)   Collection Time: 12/05/13 10:31 PM  Result Value Ref Range Status   Specimen Description ABSCESS  Final   Special Requests   Final    BILATERAL ASPIRATION OF BRAIN ABSCESS SPECIMEN NO 1   Acid Fast Smear   Final    NO ACID FAST BACILLI SEEN Performed at [facility]    Culture   Final    CULTURE WILL BE EXAMINED FOR 6 WEEKS BEFORE ISSUING A FINAL REPORT Performed at [facility]    Report Status PENDING  Incomplete  Culture, routine-abscess     Status: None   Collection Time: 12/05/13 10:31 PM  Result Value Ref Range Status   Specimen Description ABSCESS  Final   Special Requests   Final    BILATERAL ASPIRATION OF BRAIN ABSCESS SPECIMEN NO 1   Gram Stain   Final    MODERATE WBC PRESENT, PREDOMINANTLY PMN NO SQUAMOUS EPITHELIAL CELLS SEEN MODERATE GRAM POSITIVE COCCI IN PAIRS Performed at [facility]    Culture   Final    MODERATE MICROAEROPHILIC STREPTOCOCCI Note: Standardized susceptibility testing for this organism is not available. Performed at [facility]    Report Status 12/09/2013 FINAL  Final  Anaerobic culture     Status: None (Preliminary result)   Collection Time: 12/05/13 10:37 PM  Result Value Ref Range Status   Specimen Description ABSCESS  Final   Special Requests   Final    BILATERAL ASPIRATION OF BRAIN ABSCESS SPECIMEN NO 2   Gram Stain   Final    ABUNDANT WBC PRESENT, PREDOMINANTLY PMN NO SQUAMOUS EPITHELIAL CELLS SEEN MODERATE GRAM POSITIVE COCCI IN PAIRS Performed at [facility]    Culture   Final    NO ANAEROBES ISOLATED; CULTURE IN PROGRESS FOR 5 DAYS Performed at [facility]    Report Status PENDING  Incomplete  Fungus Culture with Smear     Status: None (Preliminary result)   Collection Time: 12/05/13 10:37 PM  Result Value Ref Range Status   Specimen Description OTHER  Final   Special Requests   Final    BILATERAL ASPIRATION OF BRAIN ABSCESS  SPECIMEN NO 2   Fungal Smear   Final    NO YEAST OR FUNGAL ELEMENTS SEEN Performed at [facility]   Final    CULTURE IN PROGRESS FOR FOUR WEEKS  Performed at [facility]    Report Status PENDING  Incomplete  Fungus Culture with Smear     Status: None (Preliminary result)   Collection Time: 12/05/13 10:37 PM  Result Value Ref Range Status   Specimen Description ABSCESS  Final   Special Requests   Final    BILATERAL ASPIRATION OF BRAIN ABSCESS SPECIMEN NO 3   Fungal Smear   Final    NO YEAST OR FUNGAL ELEMENTS SEEN Performed at [facility]    Culture   Final    CULTURE IN PROGRESS FOR FOUR WEEKS Performed at [facility]    Report Status PENDING  Incomplete  AFB culture with smear     Status: None (Preliminary result)   Collection Time: 12/05/13 10:37 PM  Result Value Ref Range Status   Specimen Description OTHER  Final   Special Requests   Final    BILATERAL ASPIRATION OF BRAIN ABSCESS SPECIMEN NO 2   Acid Fast Smear   Final    NO ACID FAST BACILLI SEEN Performed at [facility]    Culture   Final    CULTURE WILL BE EXAMINED FOR 6 WEEKS BEFORE ISSUING A FINAL REPORT Performed at [facility]    Report Status PENDING  Incomplete  AFB culture with smear     Status: None (Preliminary result)   Collection Time: 12/05/13 10:37 PM  Result Value Ref Range Status   Specimen Description ABSCESS  Final   Special Requests   Final    BILATERAL ASPIRATION OF BRAIN ABSCESS SPECIMEN NO 3   Acid Fast Smear   Final    NO ACID FAST BACILLI SEEN Performed at [facility]    Culture   Final    CULTURE WILL BE EXAMINED FOR 6 WEEKS BEFORE ISSUING A FINAL REPORT Performed at [facility]    Report Status PENDING  Incomplete  Culture, routine-abscess     Status: None   Collection Time: 12/05/13 10:37 PM  Result Value Ref Range Status   Specimen Description ABSCESS  Final   Special Requests   Final     BILATERAL ASPIRATION OF BRAIN ABSCESS SPECIMEN NO 2   Gram Stain   Final    ABUNDANT WBC PRESENT, PREDOMINANTLY PMN NO SQUAMOUS EPITHELIAL CELLS SEEN MODERATE GRAM POSITIVE COCCI IN PAIRS Performed at [facility]    Culture   Final    FEW MICROAEROPHILIC STREPTOCOCCI Note: Standardized susceptibility testing for this organism is not available. Performed at [facility]    Report Status 12/09/2013 FINAL  Final  Culture, routine-abscess     Status: None   Collection Time: 12/05/13 10:37 PM  Result Value Ref Range Status   Specimen Description ABSCESS  Final   Special Requests   Final    BILATERAL ASPIRATION OF BRAIN ABSCESS SPECIMEN NO 3   Gram Stain   Final    ABUNDANT WBC PRESENT, PREDOMINANTLY PMN NO SQUAMOUS EPITHELIAL CELLS SEEN MODERATE GRAM POSITIVE COCCI IN PAIRS Performed at [facility]    Culture   Final    MODERATE MICROAEROPHILIC STREPTOCOCCI Note: Standardized susceptibility testing for this organism is not available. Performed at [facility]    Report Status 12/09/2013 FINAL  Final  Anaerobic culture     Status: None (Preliminary result)   Collection Time: 12/05/13 10:37 PM  Result Value Ref Range Status   Specimen Description ABSCESS  Final   Special Requests BILATERAL ASPIRATION OF BRAIN ABSCESS  Final   Gram Stain   Final    ABUNDANT WBC PRESENT, PREDOMINANTLY PMN NO SQUAMOUS EPITHELIAL CELLS SEEN MODERATE GRAM POSITIVE COCCI IN PAIRS Performed at [facility]    Culture   Final    NO ANAEROBES ISOLATED; CULTURE IN PROGRESS FOR 5 DAYS Performed at [facility]    Report Status PENDING  Incomplete   Studies/Results: Dg Abd Portable 1v  12/10/2013   CLINICAL DATA:  Feeding tube placement. The tube has been advanced since earlier today.  EXAM: PORTABLE ABDOMEN - 1 VIEW  COMPARISON:  Earlier today.  FINDINGS: The feeding tube has been advanced with its tip in the mid stomach. Gas-filled loops of colon  and small bowel without dilatation. Mild scoliosis. Lumbar and lower thoracic spine degenerative changes.  IMPRESSION: Feeding tube tip in the mid stomach.   Electronically Signed   By: Enrique Sack M.D.   On: 12/10/2013 17:07   Dg Abd Portable 1v  12/10/2013   CLINICAL DATA:  Encounter for imaging study to confirm nasogastric (NG) tube placement Z01.89 (ICD-10-CM)  EXAM: PORTABLE ABDOMEN - 1 VIEW  COMPARISON:  12/09/2013  FINDINGS: The feeding tube tip is in the lower chest, probably in the distal esophagus. Nonspecific bowel gas pattern.  IMPRESSION: Feeding tube tip in the distal esophagus region.   Electronically Signed   By: Markus Daft M.D.   On: 12/10/2013 15:28   Dg Abd Portable 1v  12/09/2013   CLINICAL DATA:  Evaluate feeding tube position  EXAM: PORTABLE ABDOMEN - 1 VIEW  COMPARISON:  Prior abdominal radiograph 12/06/2013  FINDINGS: The distal progression of the weighted tip enteric feeding tube. The tip of the tube is now in the descending duodenum. No evidence of bowel obstruction. The lung bases are clear. Osseous structures are unremarkable.  IMPRESSION: 1. The weighted tip of the enteric feeding tube now projects over the descending duodenum. 2. No evidence of bowel obstruction.   Electronically Signed   By: Jacqulynn Cadet M.D.   On: 12/09/2013 22:32   Medications: I have reviewed the patient's current medications. Scheduled Meds: . cefTRIAXone (ROCEPHIN)  IV  2 g Intravenous Q12H  . dexamethasone  4 mg Intravenous 4 times per day  . famotidine  10 mg Oral BID  . feeding supplement (GLUCERNA SHAKE)  237 mL Oral BID BM  . fenofibrate  54 mg Oral Daily  . fluconazole  100 mg Per Tube Daily  . insulin aspart  0-9 Units Subcutaneous TID WC  . levETIRAcetam  500 mg Intravenous Q12H  . metronidazole  500 mg Intravenous Q6H  . mometasone-formoterol  2 puff Inhalation BID  . multivitamin with minerals  1 tablet Oral Daily  . neomycin-bacitracin-polymyxin   Topical BID  . nicotine  21  mg Transdermal Q24H  . pantoprazole (PROTONIX) IV  40 mg Intravenous QHS  . polyethylene glycol  17 g Oral BID   Continuous Infusions: . sodium chloride 20 mL/hr at 12/11/13 0949  . feeding supplement (JEVITY 1.2 CAL) 1,000 mL (12/11/13 1049)   PRN Meds:.acetaminophen, albuterol, clonazePAM, diclofenac sodium, hydrocortisone cream, hydrOXYzine, ipratropium-albuterol, labetalol, morphine, ondansetron **OR** ondansetron (ZOFRAN) IV, promethazine Assessment/Plan: Principal Problem:   Brain abscess Active Problems:   Dyslipidemia   Anxiety state   HYPERTENSION, BENIGN ESSENTIAL   Peripheral vascular disease   COPD (chronic obstructive pulmonary disease)   Tobacco abuse   Abnormality of gait   Loss of weight   Malnutrition   Pneumonia   Heroin abuse  Protein-calorie malnutrition, severe   DNR (do not resuscitate) discussion   Pain in back   Weakness generalized   Palliative care encounter   Brain abscesses-  - Decadron 4mg  q6h.  - keppra q12  - ID following, cont flagyl and rocephin - brain abscess cultures positive for microaerophilic streptococci  - SW looking for SNF placement, has no bed offers currently, extended to statewide SNF search - Cdiff pending - PICC line placement ordered  Dysphagia - [name] NG tube in place, okay to give tube feeds through this - spoke with SLP who will re evaluate patient today. Hopefully since he has regained neuro fx he will be able to tolerate a diet and will not need a NG tube or PEG tube - if fails speech eval will consider PEG tube placement  Cavitary Pneumonia: 3.6cm well defined nodule in left upper lobe that could represent metatstatic disease if pt has primary malignancy seen on chest CT on admisison. Likely due to scarring upon review w/ Dr. Beryle Beams - can get repeat Chest CT before pt is discharged  - abx as noted above    Lower Extremity pain now w/ new weakness: Pt initially presented with LE pain that progressed to LE  weakness that is now improving. Dopplers negative for DVT. MRI of cervical, thoracic, and lumbar spine w/ and w/o contrast neg for malignancy, minimal disc bulging noted along with chronic T4 compression fracture noted. - transitioned to oral morphine 7.5mg  q4h - will cont to monitor - PT/OT working w/ patient  Pharyngeal candidiasis-- pt received one dose of amp B on 11/24 - diflucan 100mg  x 21 days (end date 12/18)  Polysubstance Abuse: 50 pack year smoker who started using IV heroin about a year ago when his pain medications were not renewed. - nicotine patch - morphine 7.5mg  q4h prn for pain - there has been some concern for wife possibly giving pt drugs;sons state it is a possibility she may slip pt drugs. Sitter in the room w/ pt, will keep this in mind if patient has sudden change in mental status  COPD: Progressive emphysema; smoking history of 50 pack years. Had recent PFTs. Uses combivent and advair. - Continue home medications  Anxiety:  - Continue klonopin 0.5mg  BID   DMII:   - SSI-s - continue CBG checks QID ACHS  Essential Hypertension: normotensive. Will continue to monitor. Did not see any BP home meds listed.  DVT Ppx:  - SCDs  Diet:  - clear liquids per NG tube  Dispo: Disposition is deferred at this time, awaiting improvement of current medical problems. Anticipated discharge in approximately 5 day(s).   The patient does have a current PCP (Kelby Aline, MD) and does need an [facility] hospital follow-up appointment after discharge.  The patient does have transportation limitations that hinder transportation to clinic appointments.  .Services Needed at time of discharge: Y = Yes, Blank = No PT:   OT: SNF  RN:   Equipment:   Other:     LOS: 11 days   Julious Oka, MD 12/11/2013, 11:28 AM

## 2013-12-11 NOTE — Plan of Care (Signed)
Problem: Phase I Progression Outcomes Goal: Pain controlled with appropriate interventions Outcome: Progressing Goal: OOB as tolerated unless otherwise ordered Outcome: Completed/Met Date Met:  12/11/13 Pt is 2 assist  Goal: Initial discharge plan identified Outcome: Completed/Met Date Met:  12/11/13  Problem: Phase II Progression Outcomes Goal: Progress activity as tolerated unless otherwise ordered Outcome: Completed/Met Date Met:  12/11/13 Goal: Vital signs remain stable Outcome: Progressing Goal: Obtain order to discontinue catheter if appropriate Outcome: Completed/Met Date Met:  12/11/13  Problem: Phase III Progression Outcomes Goal: Foley discontinued Outcome: Completed/Met Date Met:  12/11/13

## 2013-12-11 NOTE — Progress Notes (Signed)
Peripherally Inserted Central Catheter/Midline Placement  The IV Nurse has discussed with the patient and/or persons authorized to consent for the patient, the purpose of this procedure and the potential benefits and risks involved with this procedure.  The benefits include less needle sticks, lab draws from the catheter and patient may be discharged home with the catheter.  Risks include, but not limited to, infection, bleeding, blood clot (thrombus formation), and puncture of an artery; nerve damage and irregular heat beat.  Alternatives to this procedure were also discussed.  PICC/Midline Placement Documentation        [name] 12/11/2013, 12:03 PM

## 2013-12-11 NOTE — Progress Notes (Signed)
Per MD, okay to remove foley.

## 2013-12-12 ENCOUNTER — Inpatient Hospital Stay (HOSPITAL_COMMUNITY): Payer: Medicaid Other

## 2013-12-12 LAB — BASIC METABOLIC PANEL
Anion gap: 14 (ref 5–15)
BUN: 46 mg/dL — AB (ref 6–23)
CALCIUM: 9.2 mg/dL (ref 8.4–10.5)
CO2: 22 mEq/L (ref 19–32)
CREATININE: 0.74 mg/dL (ref 0.50–1.35)
Chloride: 97 mEq/L (ref 96–112)
GLUCOSE: 260 mg/dL — AB (ref 70–99)
POTASSIUM: 4.9 meq/L (ref 3.7–5.3)
Sodium: 133 mEq/L — ABNORMAL LOW (ref 137–147)

## 2013-12-12 LAB — CBC
HCT: 39.9 % (ref 39.0–52.0)
Hemoglobin: 13 g/dL (ref 13.0–17.0)
MCH: 26.4 pg (ref 26.0–34.0)
MCHC: 32.6 g/dL (ref 30.0–36.0)
MCV: 80.9 fL (ref 78.0–100.0)
Platelets: 390 10*3/uL (ref 150–400)
RBC: 4.93 MIL/uL (ref 4.22–5.81)
RDW: 15.9 % — AB (ref 11.5–15.5)
WBC: 7.9 10*3/uL (ref 4.0–10.5)

## 2013-12-12 LAB — ANAEROBIC CULTURE

## 2013-12-12 LAB — GLUCOSE, CAPILLARY
GLUCOSE-CAPILLARY: 137 mg/dL — AB (ref 70–99)
GLUCOSE-CAPILLARY: 150 mg/dL — AB (ref 70–99)
Glucose-Capillary: 117 mg/dL — ABNORMAL HIGH (ref 70–99)
Glucose-Capillary: 240 mg/dL — ABNORMAL HIGH (ref 70–99)
Glucose-Capillary: 157 mg/dL — ABNORMAL HIGH (ref 70–99)
Glucose-Capillary: 169 mg/dL — ABNORMAL HIGH (ref 70–99)

## 2013-12-12 LAB — CLOSTRIDIUM DIFFICILE BY PCR: Toxigenic C. Difficile by PCR: NEGATIVE

## 2013-12-12 MED ORDER — LORAZEPAM 2 MG/ML IJ SOLN
0.5000 mg | Freq: Once | INTRAMUSCULAR | Status: AC
  Administered 2013-12-12: 0.5 mg via INTRAVENOUS
  Filled 2013-12-12: qty 1

## 2013-12-12 MED ORDER — CHLORHEXIDINE GLUCONATE 0.12 % MT SOLN
15.0000 mL | Freq: Two times a day (BID) | OROMUCOSAL | Status: DC
  Administered 2013-12-13 – 2013-12-17 (×8): 15 mL via OROMUCOSAL
  Filled 2013-12-12 (×12): qty 15

## 2013-12-12 MED ORDER — CETYLPYRIDINIUM CHLORIDE 0.05 % MT LIQD
7.0000 mL | Freq: Two times a day (BID) | OROMUCOSAL | Status: DC
  Administered 2013-12-12 – 2013-12-17 (×9): 7 mL via OROMUCOSAL

## 2013-12-12 NOTE — Progress Notes (Signed)
Speech Language Pathology  Patient Details Name: [name] MRN: [id number] DOB: [birth year] Today's Date: 12/12/2013 Time:  -      MBS completed. Full report will be documented. Unfortunately no improvements in swallow function from prior [facility] 11/25. Pt continues to penetrate and aspirate on all po trials, compensatory strategies not attempted during study due to increased aspiration risk (pyriform sinus residue).  Not safe with any poi's. SLP explained results, verbally and visually using monitor. Pt. Stated, "just pull all the tube, I'm tired of living like this." Noted that Palliative care is following. Pt may desire comfort feeds (?)  Orbie Pyo [place] M.Ed [employer] [phone]
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
## 2013-12-12 NOTE — Consult Note (Signed)
Progress Note from the Palliative Medicine Team at [facility]:  -patient is alert and oreitend and continues to verbalize his desire to stop current medical treatment aware that "if I stop I will die"  -agrees to swallow study today and further discussion with his son tomorrow (meeting set for 1115)     Objective: Allergies  Allergen Reactions  . Fish Allergy Hives, Swelling and Rash  . Buprenorphine Hcl-Naloxone Hcl Other (See Comments)    "feel like going to die" jittery, trouble breathing, feels hot  . Shellfish Allergy Hives  . Benadryl [Diphenhydramine Hcl] Hives, Itching and Rash   Scheduled Meds: . cefTRIAXone (ROCEPHIN)  IV  2 g Intravenous Q12H  . dexamethasone  4 mg Intravenous 4 times per day  . famotidine  10 mg Oral BID  . feeding supplement (GLUCERNA SHAKE)  237 mL Oral BID BM  . fenofibrate  54 mg Oral Daily  . fluconazole  100 mg Per Tube Daily  . insulin aspart  0-9 Units Subcutaneous TID WC  . levETIRAcetam  500 mg Intravenous Q12H  . metronidazole  500 mg Intravenous Q6H  . mometasone-formoterol  2 puff Inhalation BID  . multivitamin with minerals  1 tablet Oral Daily  . neomycin-bacitracin-polymyxin   Topical BID  . nicotine  21 mg Transdermal Q24H  . pantoprazole (PROTONIX) IV  40 mg Intravenous QHS  . polyethylene glycol  17 g Oral BID   Continuous Infusions: . sodium chloride 20 mL/hr at 12/12/13 0400  . feeding supplement (JEVITY 1.2 CAL) 1,000 mL (12/12/13 0833)   PRN Meds:.acetaminophen, albuterol, clonazePAM, diclofenac sodium, hydrocortisone cream, hydrOXYzine, ipratropium-albuterol, labetalol, morphine, ondansetron **OR** ondansetron (ZOFRAN) IV, promethazine, sodium chloride  BP 143/82 mmHg  Pulse 81  Temp(Src) 97.6 F (36.4 C) (Axillary)  Resp 16  Ht 6\' 1"  (1.854 m)  Wt 52.2 kg (115 lb 1.3 oz)  BMI 15.19 kg/m2  SpO2 94%   PPS: 20 %     Intake/Output Summary (Last 24 hours) at 12/12/13 0956 Last data filed at 12/12/13  9628  Gross per 24 hour  Intake 1446.16 ml  Output   1176 ml  Net 270.16 ml       Physical Exam:  General: cachectic, weak  HEENT: Poor dentation, dry buccal membranes,(unable to clear throat secretions)  Chest: Decreased in bases, scattered coarse BS CVS: RRR Abdomen: soft NT +BS Ext: Left hemiplegia, right sided weakness Neuro: alert and oriented X3   Labs: CBC    Component Value Date/Time   WBC 7.9 12/12/2013 0440   RBC 4.93 12/12/2013 0440   HGB 13.0 12/12/2013 0440   HCT 39.9 12/12/2013 0440   PLT 390 12/12/2013 0440   MCV 80.9 12/12/2013 0440   MCH 26.4 12/12/2013 0440   MCHC 32.6 12/12/2013 0440   RDW 15.9* 12/12/2013 0440   LYMPHSABS 1.4 12/07/2013 0500   MONOABS 0.6 12/07/2013 0500   EOSABS 0.0 12/07/2013 0500   BASOSABS 0.0 12/07/2013 0500    BMET    Component Value Date/Time   NA 133* 12/12/2013 0440   K 4.9 12/12/2013 0440   CL 97 12/12/2013 0440   CO2 22 12/12/2013 0440   GLUCOSE 260* 12/12/2013 0440   BUN 46* 12/12/2013 0440   CREATININE 0.74 12/12/2013 0440   CREATININE 0.89 12/08/2011 1043   CALCIUM 9.2 12/12/2013 0440   CALCIUM 9.1 10/28/2009 2118   GFRNONAA >90 12/12/2013 0440   GFRAA >90 12/12/2013 0440    CMP     Component Value  Date/Time   NA 133* 12/12/2013 0440   K 4.9 12/12/2013 0440   CL 97 12/12/2013 0440   CO2 22 12/12/2013 0440   GLUCOSE 260* 12/12/2013 0440   BUN 46* 12/12/2013 0440   CREATININE 0.74 12/12/2013 0440   CREATININE 0.89 12/08/2011 1043   CALCIUM 9.2 12/12/2013 0440   CALCIUM 9.1 10/28/2009 2118   PROT 7.6 12/01/2013 0414   ALBUMIN 2.4* 12/01/2013 0414   AST 60* 12/01/2013 0414   ALT 34 12/01/2013 0414   ALKPHOS 73 12/01/2013 0414   BILITOT 0.4 12/01/2013 0414   GFRNONAA >90 12/12/2013 0440   GFRAA >90 12/12/2013 0440      Assessment and Plan: 1. Code Status:  DNR/DNI 2. Symptom Control: Continue with prn short acting agents, working well at this time 3. Psycho/Social:  Emotional support  offered to patient 4. Disposition:  Pending outcomes and decisions,  Home vs SNF vs Hospice facility  Patient Documents Completed or Given: Document Given Completed  Advanced Directives Pkt    MOST    DNR X   Gone from My [facility]    Hard Choices      Time In Time Out Total Time Spent with Patient Total Overall Time  0800 0900 60 min 60 min    Greater than 50%  of this time was spent counseling and coordinating care related to the above assessment and plan.  Wadie Lessen NP  Palliative Medicine Team Team Phone # [phone] Pager [phone]   1

## 2013-12-12 NOTE — Progress Notes (Signed)
NUTRITION FOLLOW UP  Intervention:   - Continue Jevity 1.2 @ 70 ml/hr to provide 2016 kcal, 93 g protein and 1356 ml of free water.  - Recommend PEG tube vs comfort feeds for long-term nutrition. - RD will continue to monitor for nutrition care plan.  Nutrition Dx:   Inadequate oral intake related to poor appetite as evidenced by 28% weight loss in one year; ongoing  Goal:   Pt to meet >/= 90% of their estimated nutrition needs; met  Monitor:   TF initiation & tolerance, weight, labs, I/O's  Assessment:   53 yo man with a history of CVA, PVD, COPD (emphysema), tobacco and IV heroin abuse, grade I diastolic CHF, hepatitis C, remote TB, DMII, COPD and tobacco abuse who is presenting with recent worsening lower extremity pain and edema and falls over the past 2 days.  Patient s/p procedure 11/25: BILATERAL ASPIRATION OF BRAIN ABSCESS  Pt transferred to 2S-SICU from 4N-Neuroscience post-op.  S/p MBSS today. Pt with moderate esophageal/oral phase dysphagia and severe pharyngeal dysphagia. SLP recommending NPO status with alternative means of nutrition vs comfort feeds?   Spoke with RN who said that pt is tolerating TF at this time. Pt refused TF overnight. TF restarted this am. Pt has intermittently been demanding to be disconnected from all tubes. Pt followed by palliative care team.   Height: Ht Readings from Last 1 Encounters:  12/01/13 '6\' 1"'  (1.854 m)    Weight Status:   Wt Readings from Last 1 Encounters:  12/12/13 115 lb 1.3 oz (52.2 kg)    Re-estimated needs:  Kcal: 1800-2100 Protein: 85-95 g Fluid: 2.0 L/day  Skin: +2 RLE and LLE edema; open incision on right toe  Diet Order:     Intake/Output Summary (Last 24 hours) at 12/12/13 1419 Last data filed at 12/12/13 1419  Gross per 24 hour  Intake 1096.16 ml  Output   1176 ml  Net -79.84 ml    Last BM: 11/28   Labs:   Recent Labs Lab 12/07/13 0500 12/09/13 0414 12/10/13 0423 12/12/13 0440  NA 136*  132* 137 133*  K 4.2 5.7* 4.8 4.9  CL 103 100 100 97  CO2 19 18* 22 22  BUN 39* 41* 42* 46*  CREATININE 1.08 0.78 0.76 0.74  CALCIUM 9.0 9.3 9.4 9.2  MG 2.4  --   --   --   PHOS 4.0  --   --   --   GLUCOSE 138* 214* 155* 260*    CBG (last 3)   Recent Labs  12/12/13 0446 12/12/13 0743 12/12/13 1216  GLUCAP 240* 157* 137*    Scheduled Meds: . cefTRIAXone (ROCEPHIN)  IV  2 g Intravenous Q12H  . dexamethasone  4 mg Intravenous 4 times per day  . famotidine  10 mg Oral BID  . feeding supplement (GLUCERNA SHAKE)  237 mL Oral BID BM  . fenofibrate  54 mg Oral Daily  . fluconazole  100 mg Per Tube Daily  . insulin aspart  0-9 Units Subcutaneous TID WC  . levETIRAcetam  500 mg Intravenous Q12H  . metronidazole  500 mg Intravenous Q6H  . mometasone-formoterol  2 puff Inhalation BID  . multivitamin with minerals  1 tablet Oral Daily  . neomycin-bacitracin-polymyxin   Topical BID  . nicotine  21 mg Transdermal Q24H  . pantoprazole (PROTONIX) IV  40 mg Intravenous QHS  . polyethylene glycol  17 g Oral BID    Continuous Infusions: . sodium chloride 20  mL/hr at 12/12/13 0400  . feeding supplement (JEVITY 1.2 CAL) 1,000 mL (12/12/13 5945)    Laurette Schimke MS, RD, LDN
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
## 2013-12-12 NOTE — Progress Notes (Signed)
Patient stated to RN "disconnect me from all this stuff." RN explained to patient that without feeding tube or IV access patient would be unable to receive pain medication, antibiotics and other necessary medication to make him feel better. Patient stated "I just would be better off dead." RN asked patient if he wanted to harm himself, patient denied SI. Patient stated that he just wanted to feel better. Patient reassured that he is progressing but that treatment would take some time. Patient nodded his head in agreement to keep all tubes connected at this time. Will continue to monitor.
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
## 2013-12-12 NOTE — Clinical Social Work Note (Signed)
CSW continues to follow patient for DC planning. At this time patient does not have a SNF bed available, but CSW is collaborating with AD to find a bed for patient.   Liz Beach MSW, [place], [place], [phone]

## 2013-12-12 NOTE — Progress Notes (Signed)
On-call provider notified patient is increasingly agitated. He has requested several times this shift to be "disconnected from everything." RN has explained each time that patient is not well enough to be at home, that the only method of pain medication delivery is IV or NGT and without those RN would not be able to administer pain medication. Patient maintains he wants to be "disconnected from everything". Klonopin PRN has been given already once this shift. No other orders given at this time other than to continue IV and tube feeds. Will continue to monitor.

## 2013-12-12 NOTE — Progress Notes (Signed)
Spoke with on call physician to notify that patient continues to demand to be disconnected from all tubes and states he wants to go home. Notified MD that patient has said multiple times that if staff do not remove his PICC and Panda tube that he will do it himself. Patient does have a Air cabin crew at bedside. RN has advised patient multiple times that it would be unsafe for him to attempt to remove any tubes himself. Patient has been reassured multiple times that rounding physicians will be seeing him this morning to discuss his plan of care. Patient refused to allow RN to change tube feed and continue feedings, or hang his IV antibiotics this am. Will continue to monitor.

## 2013-12-12 NOTE — Progress Notes (Signed)
OT Cancellation Note  Patient Details Name: [name] MRN: [id number] DOB: [birth year]   Cancelled Treatment:     At [facility]. Will attempt to see later if possible.  [place], OTR/L  [phone] 12/12/2013 12/12/2013, 12:00PM

## 2013-12-13 LAB — BASIC METABOLIC PANEL
ANION GAP: 13 (ref 5–15)
BUN: 45 mg/dL — ABNORMAL HIGH (ref 6–23)
CHLORIDE: 100 meq/L (ref 96–112)
CO2: 25 mEq/L (ref 19–32)
Calcium: 9.1 mg/dL (ref 8.4–10.5)
Creatinine, Ser: 0.72 mg/dL (ref 0.50–1.35)
GFR calc non Af Amer: >90 mL/min (ref >90)
Glucose, Bld: 190 mg/dL — ABNORMAL HIGH (ref 70–99)
POTASSIUM: 5.1 meq/L (ref 3.7–5.3)
SODIUM: 138 meq/L (ref 137–147)

## 2013-12-13 LAB — GLUCOSE, CAPILLARY
GLUCOSE-CAPILLARY: 251 mg/dL — AB (ref 70–99)
Glucose-Capillary: 135 mg/dL — ABNORMAL HIGH (ref 70–99)
Glucose-Capillary: 136 mg/dL — ABNORMAL HIGH (ref 70–99)
Glucose-Capillary: 158 mg/dL — ABNORMAL HIGH (ref 70–99)
Glucose-Capillary: 222 mg/dL — ABNORMAL HIGH (ref 70–99)
Glucose-Capillary: 265 mg/dL — ABNORMAL HIGH (ref 70–99)

## 2013-12-13 MED ORDER — MORPHINE SULFATE 2 MG/ML IJ SOLN
1.0000 mg | INTRAMUSCULAR | Status: DC | PRN
  Administered 2013-12-13 – 2013-12-14 (×4): 1 mg via INTRAVENOUS
  Filled 2013-12-13 (×4): qty 1

## 2013-12-13 MED ORDER — LORAZEPAM 2 MG/ML IJ SOLN
0.5000 mg | Freq: Once | INTRAMUSCULAR | Status: AC
  Administered 2013-12-13: 0.5 mg via INTRAVENOUS
  Filled 2013-12-13: qty 1
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
## 2013-12-13 NOTE — Progress Notes (Signed)
Patient upset, pulled panda tube out. Stated panda tube was causing a lot of discomfort. MD made aware.

## 2013-12-13 NOTE — Clinical Social Work Note (Signed)
Patient discussed in unit rounds this morning- Patient did not pass MBS and may need PEG placement per report. CSW is working on possible SNF options- will advise on updates as rec'd.  [name], MSW, [place]

## 2013-12-13 NOTE — Progress Notes (Signed)
Progress Note from the Palliative Medicine Team at [facility]:  -patient is alert and oreited and continues to flip/flop between  his desire to stop current medical treatment options and pursue medical interventions to prolong life  -presently patient is open to PEG placement and SNF for continued antibiotics and rehabilitation, family in agreement  -family request psychiatric recommendation on need to attain guardianship of this patient,    Objective: Allergies  Allergen Reactions  . Fish Allergy Hives, Swelling and Rash  . Buprenorphine Hcl-Naloxone Hcl Other (See Comments)    "feel like going to die" jittery, trouble breathing, feels hot  . Shellfish Allergy Hives  . Benadryl [Diphenhydramine Hcl] Hives, Itching and Rash   Scheduled Meds: . antiseptic oral rinse  7 mL Mouth Rinse q12n4p  . cefTRIAXone (ROCEPHIN)  IV  2 g Intravenous Q12H  . chlorhexidine  15 mL Mouth Rinse BID  . dexamethasone  4 mg Intravenous 4 times per day  . famotidine  10 mg Oral BID  . feeding supplement (GLUCERNA SHAKE)  237 mL Oral BID BM  . fenofibrate  54 mg Oral Daily  . fluconazole  100 mg Per Tube Daily  . insulin aspart  0-9 Units Subcutaneous TID WC  . levETIRAcetam  500 mg Intravenous Q12H  . metronidazole  500 mg Intravenous Q6H  . mometasone-formoterol  2 puff Inhalation BID  . multivitamin with minerals  1 tablet Oral Daily  . neomycin-bacitracin-polymyxin   Topical BID  . nicotine  21 mg Transdermal Q24H  . pantoprazole (PROTONIX) IV  40 mg Intravenous QHS  . polyethylene glycol  17 g Oral BID   Continuous Infusions: . sodium chloride 20 mL/hr at 12/12/13 0400  . feeding supplement (JEVITY 1.2 CAL) 1,000 mL (12/13/13 0123)   PRN Meds:.acetaminophen, albuterol, clonazePAM, diclofenac sodium, hydrocortisone cream, hydrOXYzine, ipratropium-albuterol, labetalol, morphine, ondansetron **OR** ondansetron (ZOFRAN) IV, promethazine, sodium chloride  BP 140/84 mmHg  Pulse 76   Temp(Src) 97.4 F (36.3 C) (Oral)  Resp 19  Ht 6\' 1"  (1.854 m)  Wt 55.792 kg (123 lb)  BMI 16.23 kg/m2  SpO2 100%   PPS: 20 %     Intake/Output Summary (Last 24 hours) at 12/13/13 0955 Last data filed at 12/13/13 0519  Gross per 24 hour  Intake     10 ml  Output   1500 ml  Net  -1490 ml       Physical Exam:  General: cachectic, weak OOB to chair HEENT: Poor dentation, dry buccal membranes,(unable to clear throat secretions)  Chest: Decreased in bases, scattered coarse BS CVS: RRR Abdomen: soft NT +BS Ext: Left hemiplegia, right sided weakness Neuro: alert and oriented X3 Psych: poor insight into his medical situation   Labs: CBC    Component Value Date/Time   WBC 7.9 12/12/2013 0440   RBC 4.93 12/12/2013 0440   HGB 13.0 12/12/2013 0440   HCT 39.9 12/12/2013 0440   PLT 390 12/12/2013 0440   MCV 80.9 12/12/2013 0440   MCH 26.4 12/12/2013 0440   MCHC 32.6 12/12/2013 0440   RDW 15.9* 12/12/2013 0440   LYMPHSABS 1.4 12/07/2013 0500   MONOABS 0.6 12/07/2013 0500   EOSABS 0.0 12/07/2013 0500   BASOSABS 0.0 12/07/2013 0500    BMET    Component Value Date/Time   NA 138 12/13/2013 0441   K 5.1 12/13/2013 0441   CL 100 12/13/2013 0441   CO2 25 12/13/2013 0441   GLUCOSE 190* 12/13/2013 0441   BUN 45* 12/13/2013  0441   CREATININE 0.72 12/13/2013 0441   CREATININE 0.89 12/08/2011 1043   CALCIUM 9.1 12/13/2013 0441   CALCIUM 9.1 10/28/2009 2118   GFRNONAA >90 12/13/2013 0441   GFRAA >90 12/13/2013 0441    CMP     Component Value Date/Time   NA 138 12/13/2013 0441   K 5.1 12/13/2013 0441   CL 100 12/13/2013 0441   CO2 25 12/13/2013 0441   GLUCOSE 190* 12/13/2013 0441   BUN 45* 12/13/2013 0441   CREATININE 0.72 12/13/2013 0441   CREATININE 0.89 12/08/2011 1043   CALCIUM 9.1 12/13/2013 0441   CALCIUM 9.1 10/28/2009 2118   PROT 7.6 12/01/2013 0414   ALBUMIN 2.4* 12/01/2013 0414   AST 60* 12/01/2013 0414   ALT 34 12/01/2013 0414   ALKPHOS 73  12/01/2013 0414   BILITOT 0.4 12/01/2013 0414   GFRNONAA >90 12/13/2013 0441   GFRAA >90 12/13/2013 0441      Assessment and Plan: 1. Code Status:  DNR/DNI 2. Symptom Control: Continue with prn short acting agents, working well at this time 3. Psycho/Social:  Emotional support offered to patient and family 4. Disposition:  Pending outcomes and decisions,  Home vs SNF vs Hospice facility  Patient Documents Completed or Given: Document Given Completed  Advanced Directives Pkt    MOST    DNR X   Gone from My Sight    Hard Choices      Time In Time Out Total Time Spent with Patient Total Overall Time  0800 0900 60 min 60 min    Greater than 50%  of this time was spent counseling and coordinating care related to the above assessment and plan.  Wadie Lessen NP  Palliative Medicine Team Team Phone # [phone] Pager [phone]  Discussed with Dr Eather Colas 1

## 2013-12-13 NOTE — Progress Notes (Signed)
Occupational Therapy Treatment Patient Details Name: [name] MRN: [id number] DOB: Aug 01, 1960 Today's Date: 12/13/2013    History of present illness Patient is a 53 yo male admitted 11/30/13 with BLE pain and edema, with multiple falls.  Patient with h/o brain mass, CVA, PVD, COPD, CHF, IV heroin use, Hep C, CHF, DM.   OT comments  Pt decided he wanted a PEG tube  Follow Up Recommendations  SNF          Precautions / Restrictions Precautions Precautions: Fall Restrictions Weight Bearing Restrictions: No       Mobility Bed Mobility Overal bed mobility: Needs Assistance Bed Mobility: Rolling;Supine to Sit;Sit to Sidelying Rolling: Max assist   Supine to sit: Max assist;+2 for physical assistance     General bed mobility comments: pt in chair  Transfers Overall transfer level: Needs assistance Equipment used: 2 person hand held assist Transfers: Sit to/from Stand Sit to Stand: Max assist;+2 physical assistance         General transfer comment: pt in chair    Balance Overall balance assessment: Needs assistance Sitting-balance support: Feet supported;Bilateral upper extremity supported Sitting balance-Leahy Scale: Poor Sitting balance - Comments: Pt required max assist to maintain upright trunk while sitting EOB. Pt had posterior lean and was able to inconsistently lean forward when asked. Pt had increased difficulty maintaining balance with increased fatigue.    Standing balance support: Bilateral upper extremity supported Standing balance-Leahy Scale: Zero Standing balance comment: Pt stood two times from elevated bed with 2+ max assist.                    ADL Overall ADL's : Needs assistance/impaired   Eating/Feeding Details (indicate cue type and reason): Encouraged pt to hold the cup to spit in . Pt initially wanted OT to hold it. Explained importance of using BUe as much as able to increase strength                                                     Cognition   Behavior During Therapy: Flat affect Overall Cognitive Status: Impaired/Different from baseline Area of Impairment: Following commands;Problem solving;Safety/judgement        Following Commands: Follows one step commands with increased time;Follows one step commands inconsistently Safety/Judgement: Decreased awareness of deficits;Decreased awareness of safety   Problem Solving: Slow processing;Requires verbal cues        Exercises Shoulder Exercises Shoulder Flexion: AAROM;10 reps;Both;Left Hand Exercises Wrist Flexion: AROM;Left;Right;5 reps Wrist Extension: AROM;Right;Left;5 reps      General Comments  pt with limited participation    Pertinent Vitals/ Pain       Pain Assessment: Faces Faces Pain Scale: Hurts whole lot Pain Location: did not specify Pain Descriptors / Indicators: Aching Pain Intervention(s): Limited activity within patient's tolerance;Monitored during session  Home Living                                              Progress Toward Goals  OT Goals(current goals can now be found in the care plan section)  Progress towards OT goals: Progressing toward goals     Plan Discharge plan remains appropriate  Patient Left in chair;with family/visitor present           Time: 8016-5537 OT Time Calculation (min): 26 min  Charges: OT General Charges $OT Visit: 1 Procedure OT Treatments $Self Care/Home Management : 23-37 mins $Therapeutic Exercise: 23-37 mins  Fouad Taul, Thereasa Parkin 12/13/2013, 11:49 AM
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
## 2013-12-13 NOTE — Progress Notes (Signed)
Inpatient Diabetes Program Recommendations  AACE/ADA: New Consensus Statement on Inpatient Glycemic Control (2013)  Target Ranges:  Prepandial:   less than 140 mg/dL      Peak postprandial:   less than 180 mg/dL (1-2 hours)      Critically ill patients:  140 - 180 mg/dL     Results for MOHD., [name] (MRN [id number]) as of 12/13/2013 09:30  Ref. Range 12/13/2013 00:04 12/13/2013 03:40 12/13/2013 07:53  Glucose-Capillary Latest Range: 70-99 mg/dL 222 (H) 265 (H) 251 (H)     Current Insulin Orders: Novolog Sensitive SSI tidwc   **Note patient having elevated glucose levels today.   **CBGs being checked Q4 hours.    **Patient is receiving Jevity Tube feeds at 70cc/hour in addition to IV Decadron 4 mg QID.    MD- Please change Novolog SSI coverage to Sensitive scale Q4 hours (currently only ordered tid)    Will follow Wyn Quaker RN, MSN, CDE Diabetes Coordinator Inpatient Diabetes Program Team Pager: [phone] (8a-10p)

## 2013-12-13 NOTE — Progress Notes (Signed)
Patient ID: [name], male   DOB: 01-04-1961, 53 y.o.   MRN: [id number] Aware of request for G tube in pt. Images have been reviewed and pt is candidate anatomically for tube. Will plan to assess on 12/3.

## 2013-12-13 NOTE — Progress Notes (Signed)
Physical Therapy Treatment Patient Details Name: [name] MRN: [id number] DOB: May 16, 1960 Today's Date: 12/13/2013    History of Present Illness Patient is a 53 yo male admitted 11/30/13 with BLE pain and edema, with multiple falls.  Patient with h/o brain mass, CVA, PVD, COPD, CHF, IV heroin use, Hep C, CHF, DM.    PT Comments    Pt currently with functional limitations due to decreased strength, mobility, balance and endurance. Pt will benefit from skilled PT to increase independence and safety with mobility to allow discharge to SNF with 24 hour assistance. Pt stated that he wanted to walk in PT session today. Pt continues to required 2+ max assist for transfers and max assist for bed mobility. Pt has difficulty maintaining sitting balance without UE support and exhibits significant posterior lean. Pt able to adjust trunk inconsistently with verbal cuing to lean forward but had increased difficulty with increased fatigue. Pt continues to be displeased with the lines and tubing that are hooked on him.    Follow Up Recommendations  SNF;Supervision/Assistance - 24 hour     Equipment Recommendations  Other (comment) (TBA at next venue)    Recommendations for Other Services       Precautions / Restrictions Precautions Precautions: Fall Restrictions Weight Bearing Restrictions: No    Mobility  Bed Mobility Overal bed mobility: Needs Assistance Bed Mobility: Rolling;Supine to Sit;Sit to Sidelying Rolling: Max assist   Supine to sit: Max assist;+2 for physical assistance     General bed mobility comments: Pt able to initiate moving legs off from bed with PT assistance. Pt attempted to use core muscles to sit up straight from bed by hinging at pelvis. Pt was unable to do this and PT initiated turning hips with pad to get give legs of bed and square hips. Pt required max assist for truncal support to sitting.   Transfers Overall transfer level: Needs assistance Equipment  used: 2 person hand held assist Transfers: Sit to/from Stand Sit to Stand: Max assist;+2 physical assistance         General transfer comment: Pt required 2+ max assist with elevated bed to acheive standing. Pt stood with RW for 20 seconds initially before sitting back on EOB and standing with max, 2+ handhold assist for pivot transfer to chair. Pt required cuing to lean trunk forward with standing and to bring pelvis underneath his trunk but unable to adjust body at pelvis so pt had posterior lean. Pt did better with hand hold assist.   Ambulation/Gait                 Stairs            Wheelchair Mobility    Modified Rankin (Stroke Patients Only) Modified Rankin (Stroke Patients Only) Pre-Morbid Rankin Score: No significant disability Modified Rankin: Severe disability     Balance Overall balance assessment: Needs assistance Sitting-balance support: Feet supported;Bilateral upper extremity supported Sitting balance-Leahy Scale: Poor Sitting balance - Comments: Pt required max assist to maintain upright trunk while sitting EOB. Pt had posterior lean and was able to inconsistently lean forward when asked. Pt had increased difficulty maintaining balance with increased fatigue.    Standing balance support: Bilateral upper extremity supported Standing balance-Leahy Scale: Zero Standing balance comment: Pt stood two times from elevated bed with 2+ max assist.                     Cognition Arousal/Alertness: Awake/alert Behavior During Therapy: Flat affect Overall  Cognitive Status: Impaired/Different from baseline Area of Impairment: Following commands;Problem solving;Safety/judgement       Following Commands: Follows one step commands with increased time;Follows one step commands inconsistently Safety/Judgement: Decreased awareness of deficits;Decreased awareness of safety   Problem Solving: Slow processing;Requires verbal cues      Exercises  Pt refused  to perform leg exercises once sitting in chair.      General Comments General comments (skin integrity, edema, etc.): Pt expressed displeasure with his feeding tube. Was informed not to put his hand too close to the tube so he does not pull it out.       Pertinent Vitals/Pain Pain Assessment: Faces Faces Pain Scale: Hurts a little bit Pain Location: Low back Pain Descriptors / Indicators: Aching Pain Intervention(s): Limited activity within patient's tolerance;Repositioned;Monitored during session    Home Living                      Prior Function            PT Goals (current goals can now be found in the care plan section) Acute Rehab PT Goals PT Goal Formulation: Patient unable to participate in goal setting Time For Goal Achievement: 12/22/13 Potential to Achieve Goals: Fair Progress towards PT goals: Progressing toward goals    Frequency  Min 3X/week    PT Plan Current plan remains appropriate    Co-evaluation             End of Session Equipment Utilized During Treatment: Gait belt Activity Tolerance: Patient tolerated treatment well;Patient limited by fatigue Patient left: in chair;with call bell/phone within reach;with nursing/sitter in room     Time: 1000-1018 PT Time Calculation (min) (ACUTE ONLY): 18 min  Charges:  $Therapeutic Activity: 8-22 mins                    G CodesJearld Shines SPT 12/13/2013, 11:05 AM  Jearld Shines, SPT  Acute Rehabilitation [phone]

## 2013-12-14 ENCOUNTER — Inpatient Hospital Stay (HOSPITAL_COMMUNITY): Payer: Medicaid Other

## 2013-12-14 DIAGNOSIS — R1319 Other dysphagia: Secondary | ICD-10-CM | POA: Insufficient documentation

## 2013-12-14 DIAGNOSIS — R1310 Dysphagia, unspecified: Secondary | ICD-10-CM | POA: Insufficient documentation

## 2013-12-14 LAB — PROTIME-INR
INR: 1.26 (ref 0.00–1.49)
PROTHROMBIN TIME: 15.9 s — AB (ref 11.6–15.2)

## 2013-12-14 LAB — BASIC METABOLIC PANEL
Anion gap: 13 (ref 5–15)
BUN: 43 mg/dL — ABNORMAL HIGH (ref 6–23)
CO2: 26 mEq/L (ref 19–32)
CREATININE: 0.66 mg/dL (ref 0.50–1.35)
Calcium: 9.6 mg/dL (ref 8.4–10.5)
Chloride: 99 mEq/L (ref 96–112)
Glucose, Bld: 134 mg/dL — ABNORMAL HIGH (ref 70–99)
POTASSIUM: 5.2 meq/L (ref 3.7–5.3)
Sodium: 138 mEq/L (ref 137–147)

## 2013-12-14 LAB — CBC
HCT: 42.9 % (ref 39.0–52.0)
HEMOGLOBIN: 14 g/dL (ref 13.0–17.0)
MCH: 26.5 pg (ref 26.0–34.0)
MCHC: 32.6 g/dL (ref 30.0–36.0)
MCV: 81.1 fL (ref 78.0–100.0)
Platelets: 336 10*3/uL (ref 150–400)
RBC: 5.29 MIL/uL (ref 4.22–5.81)
RDW: 16 % — AB (ref 11.5–15.5)
WBC: 17.2 10*3/uL — ABNORMAL HIGH (ref 4.0–10.5)

## 2013-12-14 LAB — GLUCOSE, CAPILLARY
Glucose-Capillary: 126 mg/dL — ABNORMAL HIGH (ref 70–99)
Glucose-Capillary: 129 mg/dL — ABNORMAL HIGH (ref 70–99)
Glucose-Capillary: 135 mg/dL — ABNORMAL HIGH (ref 70–99)
Glucose-Capillary: 136 mg/dL — ABNORMAL HIGH (ref 70–99)
Glucose-Capillary: 145 mg/dL — ABNORMAL HIGH (ref 70–99)
Glucose-Capillary: 147 mg/dL — ABNORMAL HIGH (ref 70–99)

## 2013-12-14 LAB — APTT: aPTT: 28 seconds (ref 24–37)

## 2013-12-14 MED ORDER — GLUCAGON HCL RDNA (DIAGNOSTIC) 1 MG IJ SOLR
INTRAMUSCULAR | Status: AC
  Filled 2013-12-14: qty 1

## 2013-12-14 MED ORDER — GLUCAGON HCL RDNA (DIAGNOSTIC) 1 MG IJ SOLR
INTRAMUSCULAR | Status: AC | PRN
  Administered 2013-12-14: 1 mg via INTRAVENOUS

## 2013-12-14 MED ORDER — LORAZEPAM 2 MG/ML IJ SOLN
0.5000 mg | Freq: Once | INTRAMUSCULAR | Status: AC
  Administered 2013-12-14: 0.5 mg via INTRAVENOUS
  Filled 2013-12-14: qty 1

## 2013-12-14 MED ORDER — MORPHINE SULFATE 10 MG/5ML PO SOLN
5.0000 mg | ORAL | Status: DC | PRN
  Administered 2013-12-14 – 2013-12-17 (×16): 5 mg
  Filled 2013-12-14 (×18): qty 5

## 2013-12-14 MED ORDER — LIDOCAINE HCL 1 % IJ SOLN
INTRAMUSCULAR | Status: AC
  Filled 2013-12-14: qty 20

## 2013-12-14 MED ORDER — MIDAZOLAM HCL 2 MG/2ML IJ SOLN
INTRAMUSCULAR | Status: AC | PRN
  Administered 2013-12-14 (×2): 0.5 mg via INTRAVENOUS
  Administered 2013-12-14: 1 mg via INTRAVENOUS

## 2013-12-14 MED ORDER — FENTANYL CITRATE 0.05 MG/ML IJ SOLN
INTRAMUSCULAR | Status: AC | PRN
  Administered 2013-12-14: 25 ug via INTRAVENOUS
  Administered 2013-12-14: 50 ug via INTRAVENOUS
  Administered 2013-12-14: 25 ug via INTRAVENOUS

## 2013-12-14 MED ORDER — FENTANYL CITRATE 0.05 MG/ML IJ SOLN
INTRAMUSCULAR | Status: AC
  Filled 2013-12-14: qty 4

## 2013-12-14 MED ORDER — IOHEXOL 300 MG/ML  SOLN
50.0000 mL | Freq: Once | INTRAMUSCULAR | Status: AC | PRN
  Administered 2013-12-14: 20 mL via INTRAVENOUS

## 2013-12-14 MED ORDER — CLINDAMYCIN PHOSPHATE 600 MG/50ML IV SOLN
600.0000 mg | Freq: Once | INTRAVENOUS | Status: AC
  Administered 2013-12-14: 600 mg via INTRAVENOUS
  Filled 2013-12-14: qty 50

## 2013-12-14 MED ORDER — MORPHINE SULFATE 2 MG/ML IJ SOLN
1.0000 mg | Freq: Once | INTRAMUSCULAR | Status: AC
  Administered 2013-12-14: 1 mg via INTRAVENOUS

## 2013-12-14 MED ORDER — MIDAZOLAM HCL 2 MG/2ML IJ SOLN
INTRAMUSCULAR | Status: AC
  Filled 2013-12-14: qty 4

## 2013-12-14 MED ORDER — MORPHINE SULFATE 2 MG/ML IJ SOLN
2.0000 mg | INTRAMUSCULAR | Status: DC | PRN
  Administered 2013-12-14 (×2): 2 mg via INTRAVENOUS
  Filled 2013-12-14 (×3): qty 1

## 2013-12-14 MED ORDER — CEFAZOLIN SODIUM-DEXTROSE 2-3 GM-% IV SOLR
2.0000 g | INTRAVENOUS | Status: DC
  Filled 2013-12-14: qty 50

## 2013-12-14 MED ORDER — HYDROMORPHONE HCL 1 MG/ML IJ SOLN
0.5000 mg | Freq: Once | INTRAMUSCULAR | Status: AC
  Administered 2013-12-14: 0.5 mg via INTRAVENOUS
  Filled 2013-12-14: qty 1

## 2013-12-14 NOTE — Plan of Care (Signed)
Problem: Consults Goal: Skin Care Protocol Initiated - if Braden Score 18 or less If consults are not indicated, leave blank or document N/A  Outcome: Completed/Met Date Met:  12/14/13 Goal: Nutrition Consult-if indicated Outcome: Completed/Met Date Met:  12/14/13  Problem: Phase I Progression Outcomes Goal: Pain controlled with appropriate interventions Outcome: Not Progressing Goal: Other Phase I Outcomes/Goals Outcome: Not Applicable Date Met:  12/14/13     

## 2013-12-14 NOTE — Progress Notes (Signed)
Subjective: NGT came out yesterday. No new complaints this morning.  Objective: Vital signs in last 24 hours: Filed Vitals:   12/13/13 2133 12/13/13 2344 12/14/13 0029 12/14/13 0520  BP: 151/100 142/88 129/76 152/83  Pulse: 70 71 63   Temp: 97.8 F (36.6 C)  97.5 F (36.4 C) 97.5 F (36.4 C)  TempSrc: Oral  Oral Oral  Resp: 18  16 18   Height:      Weight:      SpO2: 97%  100% 100%   Weight change:   Intake/Output Summary (Last 24 hours) at 12/14/13 1120 Last data filed at 12/14/13 1610  Gross per 24 hour  Intake    560 ml  Output   2150 ml  Net  -1590 ml   General: NAD, cachetic HEENT: NGT in place Lungs: CTAB Cardiac: RRR, S1/S2 GI: soft, scaphoid abdomen, nontender Ext: able to grip hands b/l, able to lift bilateral legs off bed  Lab Results: Basic Metabolic Panel:  Recent Labs Lab 12/13/13 0441 12/14/13 0532  NA 138 138  K 5.1 5.2  CL 100 99  CO2 25 26  GLUCOSE 190* 134*  BUN 45* 43*  CREATININE 0.72 0.66  CALCIUM 9.1 9.6    CBC:  Recent Labs Lab 12/12/13 0440 12/14/13 0532  WBC 7.9 17.2*  HGB 13.0 14.0  HCT 39.9 42.9  MCV 80.9 81.1  PLT 390 336   CBG:  Recent Labs Lab 12/13/13 1240 12/13/13 1818 12/13/13 2129 12/14/13 0030 12/14/13 0353 12/14/13 0806  GLUCAP 158* 136* 135* 135* 147* 136*   Urine Drug Screen: Drugs of Abuse     Component Value Date/Time   LABOPIA POSITIVE* 12/01/2013 0533   LABOPIA NEG 06/01/2011 1115   COCAINSCRNUR NONE DETECTED 12/01/2013 0533   COCAINSCRNUR NEG 06/01/2011 1115   LABBENZ NONE DETECTED 12/01/2013 0533   LABBENZ NEG 06/01/2011 1115   LABBENZ NEG 02/18/2009 2050   AMPHETMU NONE DETECTED 12/01/2013 0533   AMPHETMU NEG 06/01/2011 1115   AMPHETMU NEG 02/18/2009 2050   THCU NONE DETECTED 12/01/2013 0533   THCU NEG 06/01/2011 1115   LABBARB NONE DETECTED 12/01/2013 0533   LABBARB NEG 06/01/2011 1115    Micro Results: Recent Results (from the past 240 hour(s))  Anaerobic culture      Status: None   Collection Time: 12/05/13 10:31 PM  Result Value Ref Range Status   Specimen Description ABSCESS  Final   Special Requests   Final    BILATERAL ASPIRATION OF BRAIN ABSCESS SPECIMEN NO 1   Gram Stain   Final    MODERATE WBC PRESENT, PREDOMINANTLY PMN NO SQUAMOUS EPITHELIAL CELLS SEEN MODERATE GRAM POSITIVE COCCI IN PAIRS Performed at [facility]    Culture   Final    NO ANAEROBES ISOLATED Performed at [facility]    Report Status 12/12/2013 FINAL  Final  Fungus Culture with Smear     Status: None (Preliminary result)   Collection Time: 12/05/13 10:31 PM  Result Value Ref Range Status   Specimen Description ABSCESS  Final   Special Requests   Final    BILATERAL ASPIRATION OF BRAIN ABSCESS SPECIMEN NO 1   Fungal Smear   Final    NO YEAST OR FUNGAL ELEMENTS SEEN Performed at [facility]    Culture   Final    CULTURE IN PROGRESS FOR FOUR WEEKS Performed at [facility]    Report Status PENDING  Incomplete  AFB culture with smear  Status: None (Preliminary result)   Collection Time: 12/05/13 10:31 PM  Result Value Ref Range Status   Specimen Description ABSCESS  Final   Special Requests   Final    BILATERAL ASPIRATION OF BRAIN ABSCESS SPECIMEN NO 1   Acid Fast Smear   Final    NO ACID FAST BACILLI SEEN Performed at [facility]    Culture   Final    CULTURE WILL BE EXAMINED FOR 6 WEEKS BEFORE ISSUING A FINAL REPORT Performed at [facility]    Report Status PENDING  Incomplete  Culture, routine-abscess     Status: None   Collection Time: 12/05/13 10:31 PM  Result Value Ref Range Status   Specimen Description ABSCESS  Final   Special Requests   Final    BILATERAL ASPIRATION OF BRAIN ABSCESS SPECIMEN NO 1   Gram Stain   Final    MODERATE WBC PRESENT, PREDOMINANTLY PMN NO SQUAMOUS EPITHELIAL CELLS SEEN MODERATE GRAM POSITIVE COCCI IN PAIRS Performed at [facility]    Culture   Final     MODERATE MICROAEROPHILIC STREPTOCOCCI Note: Standardized susceptibility testing for this organism is not available. Performed at [facility]    Report Status 12/09/2013 FINAL  Final  Anaerobic culture     Status: None   Collection Time: 12/05/13 10:37 PM  Result Value Ref Range Status   Specimen Description ABSCESS  Final   Special Requests   Final    BILATERAL ASPIRATION OF BRAIN ABSCESS SPECIMEN NO 2   Gram Stain   Final    ABUNDANT WBC PRESENT, PREDOMINANTLY PMN NO SQUAMOUS EPITHELIAL CELLS SEEN MODERATE GRAM POSITIVE COCCI IN PAIRS Performed at [facility]    Culture   Final    NO ANAEROBES ISOLATED Performed at [facility]    Report Status 12/12/2013 FINAL  Final  Fungus Culture with Smear     Status: None (Preliminary result)   Collection Time: 12/05/13 10:37 PM  Result Value Ref Range Status   Specimen Description OTHER  Final   Special Requests   Final    BILATERAL ASPIRATION OF BRAIN ABSCESS SPECIMEN NO 2   Fungal Smear   Final    NO YEAST OR FUNGAL ELEMENTS SEEN Performed at [facility]    Culture   Final    CULTURE IN PROGRESS FOR FOUR WEEKS Performed at [facility]    Report Status PENDING  Incomplete  Fungus Culture with Smear     Status: None (Preliminary result)   Collection Time: 12/05/13 10:37 PM  Result Value Ref Range Status   Specimen Description ABSCESS  Final   Special Requests   Final    BILATERAL ASPIRATION OF BRAIN ABSCESS SPECIMEN NO 3   Fungal Smear   Final    NO YEAST OR FUNGAL ELEMENTS SEEN Performed at [facility]    Culture   Final    CULTURE IN PROGRESS FOR FOUR WEEKS Performed at [facility]    Report Status PENDING  Incomplete  AFB culture with smear     Status: None (Preliminary result)   Collection Time: 12/05/13 10:37 PM  Result Value Ref Range Status   Specimen Description OTHER  Final   Special Requests   Final    BILATERAL ASPIRATION OF BRAIN ABSCESS  SPECIMEN NO 2   Acid Fast Smear   Final    NO ACID FAST BACILLI SEEN Performed at [facility]  Final    CULTURE WILL BE EXAMINED FOR 6 WEEKS BEFORE ISSUING A FINAL REPORT Performed at [facility]    Report Status PENDING  Incomplete  AFB culture with smear     Status: None (Preliminary result)   Collection Time: 12/05/13 10:37 PM  Result Value Ref Range Status   Specimen Description ABSCESS  Final   Special Requests   Final    BILATERAL ASPIRATION OF BRAIN ABSCESS SPECIMEN NO 3   Acid Fast Smear   Final    NO ACID FAST BACILLI SEEN Performed at [facility]    Culture   Final    CULTURE WILL BE EXAMINED FOR 6 WEEKS BEFORE ISSUING A FINAL REPORT Performed at [facility]    Report Status PENDING  Incomplete  Culture, routine-abscess     Status: None   Collection Time: 12/05/13 10:37 PM  Result Value Ref Range Status   Specimen Description ABSCESS  Final   Special Requests   Final    BILATERAL ASPIRATION OF BRAIN ABSCESS SPECIMEN NO 2   Gram Stain   Final    ABUNDANT WBC PRESENT, PREDOMINANTLY PMN NO SQUAMOUS EPITHELIAL CELLS SEEN MODERATE GRAM POSITIVE COCCI IN PAIRS Performed at [facility]    Culture   Final    FEW MICROAEROPHILIC STREPTOCOCCI Note: Standardized susceptibility testing for this organism is not available. Performed at [facility]    Report Status 12/09/2013 FINAL  Final  Culture, routine-abscess     Status: None   Collection Time: 12/05/13 10:37 PM  Result Value Ref Range Status   Specimen Description ABSCESS  Final   Special Requests   Final    BILATERAL ASPIRATION OF BRAIN ABSCESS SPECIMEN NO 3   Gram Stain   Final    ABUNDANT WBC PRESENT, PREDOMINANTLY PMN NO SQUAMOUS EPITHELIAL CELLS SEEN MODERATE GRAM POSITIVE COCCI IN PAIRS Performed at [facility]    Culture   Final    MODERATE MICROAEROPHILIC STREPTOCOCCI Note: Standardized susceptibility testing for this  organism is not available. Performed at [facility]    Report Status 12/09/2013 FINAL  Final  Anaerobic culture     Status: None   Collection Time: 12/05/13 10:37 PM  Result Value Ref Range Status   Specimen Description ABSCESS  Final   Special Requests BILATERAL ASPIRATION OF BRAIN ABSCESS  Final   Gram Stain   Final    ABUNDANT WBC PRESENT, PREDOMINANTLY PMN NO SQUAMOUS EPITHELIAL CELLS SEEN MODERATE GRAM POSITIVE COCCI IN PAIRS Performed at [facility]    Culture   Final    NO ANAEROBES ISOLATED Performed at [facility]    Report Status 12/12/2013 FINAL  Final  Clostridium Difficile by PCR     Status: None   Collection Time: 12/11/13  5:32 PM  Result Value Ref Range Status   C difficile by pcr NEGATIVE NEGATIVE Final   Studies/Results: No results found. Medications: I have reviewed the patient's current medications. Scheduled Meds: . antiseptic oral rinse  7 mL Mouth Rinse q12n4p  .  ceFAZolin (ANCEF) IV  2 g Intravenous On Call  . cefTRIAXone (ROCEPHIN)  IV  2 g Intravenous Q12H  . chlorhexidine  15 mL Mouth Rinse BID  . dexamethasone  4 mg Intravenous 4 times per day  . famotidine  10 mg Oral BID  . feeding supplement (GLUCERNA SHAKE)  237 mL Oral BID BM  . fenofibrate  54 mg Oral Daily  . fentaNYL      .  fluconazole  100 mg Per Tube Daily  . levETIRAcetam  500 mg Intravenous Q12H  . metronidazole  500 mg Intravenous Q6H  . midazolam      . mometasone-formoterol  2 puff Inhalation BID  . multivitamin with minerals  1 tablet Oral Daily  . neomycin-bacitracin-polymyxin   Topical BID  . nicotine  21 mg Transdermal Q24H  . pantoprazole (PROTONIX) IV  40 mg Intravenous QHS  . polyethylene glycol  17 g Oral BID   Continuous Infusions: . sodium chloride 20 mL/hr at 12/12/13 0400  . feeding supplement (JEVITY 1.2 CAL) 1,000 mL (12/13/13 0123)   PRN Meds:.acetaminophen, albuterol, clonazePAM, diclofenac sodium, hydrocortisone cream,  hydrOXYzine, ipratropium-albuterol, labetalol, morphine injection, ondansetron **OR** ondansetron (ZOFRAN) IV, promethazine, sodium chloride   Assessment/Plan: Principal Problem:   Brain abscess Active Problems:   Dyslipidemia   Anxiety state   HYPERTENSION, BENIGN ESSENTIAL   Peripheral vascular disease   COPD (chronic obstructive pulmonary disease)   Tobacco abuse   Abnormality of gait   Loss of weight   Malnutrition   Pneumonia   Heroin abuse   Protein-calorie malnutrition, severe   DNR (do not resuscitate) discussion   Pain in back   Weakness generalized   Palliative care encounter   Dysphagia  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 Leukocytosis this morning likely 2/2 decadron - afebrile otherwise. - Decadron 4mg  Q6hr - keppra 500mg  Q12hr - ID following, cont flagyl 500mg  Q6hr and rocephin 2g Q12hr (end date 1/14) - SW looking for SNF placement, has no bed offers currently, extended to statewide SNF search - PICC line placed  Dysphagia:  - IR G tube placement likely today  Pneumonia: 3.6cm well defined nodule in left upper lobe that could represent metatstatic disease if pt has primary malignancy seen on chest CT on admisison. Likely due to scarring upon review w/ Dr. Beryle Beams - can get repeat Chest CT before pt is discharged  - abx as noted above   Lower extremity pain and weakness: minimal disc bulging noted along with chronic T4 compression fracture. - IV morphine 2mg  Q4hr prn, will switch to oral after G tube placed - will cont to monitor - PT/OT working w/ patient  Pharyngeal candidiasis: pt received one dose of amp B on 11/24 - diflucan 100mg  daily x 21 days (end date 12/18)  Polysubstance Abuse: - nicotine patch - IV morphine 2mg  Q4hr prn  COPD:  - Continue albuterol 2.5mg  Q6hr prn - Continue duoneb 37ml Q6hr prn - Continue Dulera 100-25mcg 2 puff BID  Anxiety:  - Continue klonopin 0.5mg  BID   DMII:  -  SSI-s - continue CBG checks Q4hr  Essential Hypertension: normotensive. Will continue to monitor.  FEN:  - NPO for G tube placement  DVT Ppx:  - SCDs  Dispo: Disposition is deferred at this time, awaiting improvement of current medical problems.  Anticipated discharge in approximately 1-2 day(s).   The patient does have a current PCP (Kelby Aline, MD) and does need an [facility] hospital follow-up appointment after discharge.  The patient does not have transportation limitations that hinder transportation to clinic appointments.  .Services Needed at time of discharge: Y = Yes, Blank = No PT:   OT: SNF  RN:   Equipment:   Other:     LOS: 14 days   Jacques Earthly, MD 12/14/2013, 11:20 AM

## 2013-12-14 NOTE — Plan of Care (Signed)
Problem: Phase II Progression Outcomes Goal: Other Phase II Outcomes/Goals Outcome: Not Applicable Date Met:  16/60/63  Problem: Phase III Progression Outcomes Goal: Other Phase III Outcomes/Goals Outcome: Not Applicable Date Met:  01/60/10  Problem: Discharge Progression Outcomes Goal: Other Discharge Outcomes/Goals Outcome: Not Applicable Date Met:  93/23/55

## 2013-12-14 NOTE — Progress Notes (Addendum)
Patient back from Radiology. He had gastrostomy tube inserted on LUQ. The tube was flushed and is patent, placement checked. Dressing intact, clean, dry, without S/S of infection. Will continue to monitor.

## 2013-12-14 NOTE — Progress Notes (Signed)
Patient very agitated this morning. Stating, "I'm going to pull this IV out. I don't need it. The doctors think they can tell me how to run my life. I want to be discharged right now." patient alert and oriented to self, place, but not situation & time at this time. Dr. Arcelia Jew notified. Orders in place.

## 2013-12-14 NOTE — Procedures (Signed)
Successful fluoroscopic guided insertion of gastrostomy tube without immediate post procedural complicatoin.   The gastrostomy tube may be used immediately for medications.  Tube feeds may be initiated in 24 hours as per the primary team.   

## 2013-12-14 NOTE — Progress Notes (Signed)
Patient ID: COHAN STIPES, male   DOB: Nov 17, 1960, 53 y.o.   MRN: [id number]  Psych consult requested for re evaluation of capacity evaluation and possible guardianship which is family requesting. Patient was out of the unit for a procedure and not seen today so will try again later today or tomorrow morning. Spoke with Nonnie Done, LCSW. Psych Social service will be contacting his family to assist regarding guardianship process.   Sahil Milner,JANARDHAHA R. 12/14/2013 2:03 PM

## 2013-12-14 NOTE — Plan of Care (Signed)
Problem: Phase II Progression Outcomes Goal: Discharge plan established Outcome: Progressing Goal: Vital signs remain stable Outcome: Progressing
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
## 2013-12-14 NOTE — Progress Notes (Signed)
Patient complaining of "10" out of "10" pain without relief from 1mg  morphine prn given at 2325 . Dr. Janece Canterbury notified. MD stated she would place orders for one time dose 0.5mg  dilaudid IV.

## 2013-12-15 DIAGNOSIS — Z720 Tobacco use: Secondary | ICD-10-CM

## 2013-12-15 DIAGNOSIS — Z9689 Presence of other specified functional implants: Secondary | ICD-10-CM

## 2013-12-15 DIAGNOSIS — B954 Other streptococcus as the cause of diseases classified elsewhere: Secondary | ICD-10-CM

## 2013-12-15 DIAGNOSIS — R262 Difficulty in walking, not elsewhere classified: Secondary | ICD-10-CM | POA: Insufficient documentation

## 2013-12-15 DIAGNOSIS — M79606 Pain in leg, unspecified: Secondary | ICD-10-CM

## 2013-12-15 DIAGNOSIS — B370 Candidal stomatitis: Secondary | ICD-10-CM

## 2013-12-15 DIAGNOSIS — Z0189 Encounter for other specified special examinations: Secondary | ICD-10-CM | POA: Insufficient documentation

## 2013-12-15 LAB — GLUCOSE, CAPILLARY
Glucose-Capillary: 110 mg/dL — ABNORMAL HIGH (ref 70–99)
Glucose-Capillary: 115 mg/dL — ABNORMAL HIGH (ref 70–99)
Glucose-Capillary: 116 mg/dL — ABNORMAL HIGH (ref 70–99)
Glucose-Capillary: 120 mg/dL — ABNORMAL HIGH (ref 70–99)
Glucose-Capillary: 128 mg/dL — ABNORMAL HIGH (ref 70–99)
Glucose-Capillary: 135 mg/dL — ABNORMAL HIGH (ref 70–99)

## 2013-12-15 LAB — CBC
HEMATOCRIT: 40.5 % (ref 39.0–52.0)
Hemoglobin: 13.5 g/dL (ref 13.0–17.0)
MCH: 26.8 pg (ref 26.0–34.0)
MCHC: 33.3 g/dL (ref 30.0–36.0)
MCV: 80.4 fL (ref 78.0–100.0)
Platelets: 298 10*3/uL (ref 150–400)
RBC: 5.04 MIL/uL (ref 4.22–5.81)
RDW: 16.1 % — AB (ref 11.5–15.5)
WBC: 12 10*3/uL — ABNORMAL HIGH (ref 4.0–10.5)

## 2013-12-15 MED ORDER — CLONAZEPAM 0.5 MG PO TABS
0.5000 mg | ORAL_TABLET | Freq: Once | ORAL | Status: AC
  Administered 2013-12-15: 0.5 mg via ORAL

## 2013-12-15 MED ORDER — JEVITY 1.2 CAL PO LIQD
1000.0000 mL | ORAL | Status: DC
  Administered 2013-12-15 – 2013-12-17 (×2): 1000 mL
  Filled 2013-12-15 (×7): qty 1000

## 2013-12-15 MED ORDER — MORPHINE SULFATE 10 MG/5ML PO SOLN: 5.0000 mg | Freq: Once | ORAL | Status: DC | PRN

## 2013-12-15 MED ORDER — MIRTAZAPINE 7.5 MG PO TABS
7.5000 mg | ORAL_TABLET | Freq: Every day | ORAL | Status: DC
  Administered 2013-12-15 – 2013-12-16 (×2): 7.5 mg
  Filled 2013-12-15 (×3): qty 1

## 2013-12-15 MED ORDER — CLONAZEPAM 0.5 MG PO TABS
ORAL_TABLET | ORAL | Status: AC
  Filled 2013-12-15: qty 1

## 2013-12-15 MED ORDER — MORPHINE SULFATE 10 MG/5ML PO SOLN
5.0000 mg | Freq: Once | ORAL | Status: AC | PRN
  Administered 2013-12-15: 5 mg

## 2013-12-15 NOTE — Progress Notes (Addendum)
Physical Therapy Treatment Patient Details Name: [name] MRN: [id number] DOB: May 15, 1960 Today's Date: 12/15/2013    History of Present Illness Patient is a 53 yo male admitted 11/30/13 with BLE pain and edema, with multiple falls.  Patient with h/o brain mass, CVA, PVD, COPD, CHF, IV heroin use, Hep C, CHF, DM.    PT Comments    Starting to move all 4's functionally.  The R LE lags behind, but helps functionally in standing and pivot to the chair.  Follow Up Recommendations  SNF;Other (comment) (I believe pt could benefit from CIR if accepted)     Equipment Recommendations  Other (comment)    Recommendations for Other Services       Precautions / Restrictions Precautions Precautions: Fall Precaution Comments: Impulsive Restrictions Weight Bearing Restrictions: No    Mobility  Bed Mobility Overal bed mobility: Needs Assistance Bed Mobility: Supine to Sit     Supine to sit: Mod assist;+2 for safety/equipment     General bed mobility comments: Initiated sitting up, but needed significant help coming forward.  Transfers Overall transfer level: Needs assistance Equipment used: Rolling walker (2 wheeled) Transfers: Sit to/from [facility] Sit to Stand: Max assist;+2 physical assistance;Mod assist (more fatigued 2nd trial with more assist needed) Stand pivot transfers: Mod assist;+2 physical assistance       General transfer comment: assist to come forward and to come up to full upright posture.  Ambulation/Gait             General Gait Details: not ready for gait yet.   Stairs            Wheelchair Mobility    Modified Rankin (Stroke Patients Only) Modified Rankin (Stroke Patients Only) Pre-Morbid Rankin Score: No significant disability Modified Rankin: Severe disability     Balance Overall balance assessment: Needs assistance Sitting-balance support: Bilateral upper extremity supported Sitting balance-Leahy Scale:  Poor Sitting balance - Comments: EOB x4 min working on upright sitting balance.  Tendency to list posteriorly unless cued to lean forward to start stand.   Standing balance support: Bilateral upper extremity supported Standing balance-Leahy Scale: Zero Standing balance comment: standing x2 for > 30 secs each trial working on knee ext/control, upright trunk posture, w/shifting and attempts to step in place.                    Cognition Arousal/Alertness: Awake/alert Behavior During Therapy: Flat affect Overall Cognitive Status: Impaired/Different from baseline (but improving) Area of Impairment: Following commands;Problem solving;Safety/judgement       Following Commands: Follows one step commands with increased time Safety/Judgement: Decreased awareness of safety;Decreased awareness of deficits   Problem Solving: Slow processing;Requires verbal cues;Requires tactile cues;Difficulty sequencing      Exercises General Exercises - Lower Extremity Heel Slides: AAROM;Both;10 reps;Supine Hip ABduction/ADduction: AAROM;Both;10 reps;Supine    General Comments General comments (skin integrity, edema, etc.): Pt now becoming aware enough to relay his need to get oob more often and try to get out of the room.      Pertinent Vitals/Pain Pain Assessment: No/denies pain    Home Living                      Prior Function            PT Goals (current goals can now be found in the care plan section) Acute Rehab PT Goals Patient Stated Goal: None stated PT Goal Formulation: Patient unable to participate in goal setting  Time For Goal Achievement: 12/22/13 Potential to Achieve Goals: Fair Progress towards PT goals: Progressing toward goals    Frequency  Min 3X/week    PT Plan Current plan remains appropriate    Co-evaluation             End of Session Equipment Utilized During Treatment: Gait belt Activity Tolerance: Patient tolerated treatment well;Patient  limited by fatigue Patient left: in chair;with call bell/phone within reach;with nursing/sitter in room     Time: 1202-1233 PT Time Calculation (min) (ACUTE ONLY): 31 min  Charges:  $Therapeutic Activity: 23-37 mins                    G Codes:      Breeann Reposa, Tessie Fass 12/15/2013, 3:03 PM 12/15/2013  Donnella Sham, PT [phone]  (pager)
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
## 2013-12-15 NOTE — Progress Notes (Addendum)
Patient called out to RN and into hallway that he is in pain, rate "10" out of 10 where G tube placed. No complications noticed at site. Dr. Arcelia Jew notified . MD stated to give patient's morphine 5mg  oral solution through G tube now at this time and to notify her if no relief.
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
## 2013-12-15 NOTE — Progress Notes (Signed)
OT Cancellation Note  Patient Details Name: [name] MRN: [id number] DOB: 05-15-1960   Cancelled Treatment:    Reason Eval/Treat Not Completed: Other (comment) (pt working with other staff (physical therapy))  Willie Loy A 12/15/2013, 12:29 PM

## 2013-12-15 NOTE — Progress Notes (Signed)
Subjective: S/p G tube placement by IR. No new complaints this morning. Reports he is willing to participate with physical therapy.  Objective: Vital signs in last 24 hours: Filed Vitals:   12/14/13 2300 12/15/13 0324 12/15/13 0700 12/15/13 1312  BP: 183/78 144/69 137/68 145/76  Pulse: 76 61 73 73  Temp: 97.6 F (36.4 C)  97.7 F (36.5 C) 97.5 F (36.4 C)  TempSrc: Oral  Oral Axillary  Resp: 22  16 18   Height:      Weight:      SpO2: 100%  100% 95%   Weight change:   Intake/Output Summary (Last 24 hours) at 12/15/13 1652 Last data filed at 12/15/13 1412  Gross per 24 hour  Intake      0 ml  Output   1500 ml  Net  -1500 ml   General: NAD, cachetic HEENT: EOMI, PERRL Lungs: CTAB Cardiac: RRR, S1/S2 GI: soft, scaphoid abdomen, nontender, G tube in place with no strikethrough of dressing Ext: able to grip hands b/l, able to lift bilateral legs off bed  Lab Results: Basic Metabolic Panel:  Recent Labs Lab 12/13/13 0441 12/14/13 0532  NA 138 138  K 5.1 5.2  CL 100 99  CO2 25 26  GLUCOSE 190* 134*  BUN 45* 43*  CREATININE 0.72 0.66  CALCIUM 9.1 9.6    CBC:  Recent Labs Lab 12/14/13 0532 12/15/13 0529  WBC 17.2* 12.0*  HGB 14.0 13.5  HCT 42.9 40.5  MCV 81.1 80.4  PLT 336 298   CBG:  Recent Labs Lab 12/14/13 1655 12/14/13 2023 12/15/13 0023 12/15/13 0514 12/15/13 0740 12/15/13 1117  GLUCAP 129* 126* 135* 115* 120* 110*   Urine Drug Screen: Drugs of Abuse     Component Value Date/Time   LABOPIA POSITIVE* 12/01/2013 0533   LABOPIA NEG 06/01/2011 1115   COCAINSCRNUR NONE DETECTED 12/01/2013 0533   COCAINSCRNUR NEG 06/01/2011 1115   LABBENZ NONE DETECTED 12/01/2013 0533   LABBENZ NEG 06/01/2011 1115   LABBENZ NEG 02/18/2009 2050   AMPHETMU NONE DETECTED 12/01/2013 0533   AMPHETMU NEG 06/01/2011 1115   AMPHETMU NEG 02/18/2009 2050   THCU NONE DETECTED 12/01/2013 0533   THCU NEG 06/01/2011 1115   LABBARB NONE DETECTED 12/01/2013 0533   LABBARB NEG 06/01/2011 1115    Micro Results: Recent Results (from the past 240 hour(s))  Anaerobic culture     Status: None   Collection Time: 12/05/13 10:31 PM  Result Value Ref Range Status   Specimen Description ABSCESS  Final   Special Requests   Final    BILATERAL ASPIRATION OF BRAIN ABSCESS SPECIMEN NO 1   Gram Stain   Final    MODERATE WBC PRESENT, PREDOMINANTLY PMN NO SQUAMOUS EPITHELIAL CELLS SEEN MODERATE GRAM POSITIVE COCCI IN PAIRS Performed at [facility]    Culture   Final    NO ANAEROBES ISOLATED Performed at [facility]    Report Status 12/12/2013 FINAL  Final  Fungus Culture with Smear     Status: None (Preliminary result)   Collection Time: 12/05/13 10:31 PM  Result Value Ref Range Status   Specimen Description ABSCESS  Final   Special Requests   Final    BILATERAL ASPIRATION OF BRAIN ABSCESS SPECIMEN NO 1   Fungal Smear   Final    NO YEAST OR FUNGAL ELEMENTS SEEN Performed at [facility]    Culture   Final    CULTURE IN PROGRESS FOR FOUR WEEKS Performed at  [facility]    Report Status PENDING  Incomplete  AFB culture with smear     Status: None (Preliminary result)   Collection Time: 12/05/13 10:31 PM  Result Value Ref Range Status   Specimen Description ABSCESS  Final   Special Requests   Final    BILATERAL ASPIRATION OF BRAIN ABSCESS SPECIMEN NO 1   Acid Fast Smear   Final    NO ACID FAST BACILLI SEEN Performed at [facility]    Culture   Final    CULTURE WILL BE EXAMINED FOR 6 WEEKS BEFORE ISSUING A FINAL REPORT Performed at [facility]    Report Status PENDING  Incomplete  Culture, routine-abscess     Status: None   Collection Time: 12/05/13 10:31 PM  Result Value Ref Range Status   Specimen Description ABSCESS  Final   Special Requests   Final    BILATERAL ASPIRATION OF BRAIN ABSCESS SPECIMEN NO 1   Gram Stain   Final    MODERATE WBC PRESENT, PREDOMINANTLY PMN NO SQUAMOUS  EPITHELIAL CELLS SEEN MODERATE GRAM POSITIVE COCCI IN PAIRS Performed at [facility]    Culture   Final    MODERATE MICROAEROPHILIC STREPTOCOCCI Note: Standardized susceptibility testing for this organism is not available. Performed at [facility]    Report Status 12/09/2013 FINAL  Final  Anaerobic culture     Status: None   Collection Time: 12/05/13 10:37 PM  Result Value Ref Range Status   Specimen Description ABSCESS  Final   Special Requests   Final    BILATERAL ASPIRATION OF BRAIN ABSCESS SPECIMEN NO 2   Gram Stain   Final    ABUNDANT WBC PRESENT, PREDOMINANTLY PMN NO SQUAMOUS EPITHELIAL CELLS SEEN MODERATE GRAM POSITIVE COCCI IN PAIRS Performed at [facility]    Culture   Final    NO ANAEROBES ISOLATED Performed at [facility]    Report Status 12/12/2013 FINAL  Final  Fungus Culture with Smear     Status: None (Preliminary result)   Collection Time: 12/05/13 10:37 PM  Result Value Ref Range Status   Specimen Description OTHER  Final   Special Requests   Final    BILATERAL ASPIRATION OF BRAIN ABSCESS SPECIMEN NO 2   Fungal Smear   Final    NO YEAST OR FUNGAL ELEMENTS SEEN Performed at [facility]    Culture   Final    CULTURE IN PROGRESS FOR FOUR WEEKS Performed at [facility]    Report Status PENDING  Incomplete  Fungus Culture with Smear     Status: None (Preliminary result)   Collection Time: 12/05/13 10:37 PM  Result Value Ref Range Status   Specimen Description ABSCESS  Final   Special Requests   Final    BILATERAL ASPIRATION OF BRAIN ABSCESS SPECIMEN NO 3   Fungal Smear   Final    NO YEAST OR FUNGAL ELEMENTS SEEN Performed at [facility]    Culture   Final    CULTURE IN PROGRESS FOR FOUR WEEKS Performed at [facility]    Report Status PENDING  Incomplete  AFB culture with smear     Status: None (Preliminary result)   Collection Time: 12/05/13 10:37 PM  Result Value Ref Range  Status   Specimen Description OTHER  Final   Special Requests   Final    BILATERAL ASPIRATION OF BRAIN ABSCESS SPECIMEN NO 2   Acid Fast Smear  Final    NO ACID FAST BACILLI SEEN Performed at [facility]    Culture   Final    CULTURE WILL BE EXAMINED FOR 6 WEEKS BEFORE ISSUING A FINAL REPORT Performed at [facility]    Report Status PENDING  Incomplete  AFB culture with smear     Status: None (Preliminary result)   Collection Time: 12/05/13 10:37 PM  Result Value Ref Range Status   Specimen Description ABSCESS  Final   Special Requests   Final    BILATERAL ASPIRATION OF BRAIN ABSCESS SPECIMEN NO 3   Acid Fast Smear   Final    NO ACID FAST BACILLI SEEN Performed at [facility]    Culture   Final    CULTURE WILL BE EXAMINED FOR 6 WEEKS BEFORE ISSUING A FINAL REPORT Performed at [facility]    Report Status PENDING  Incomplete  Culture, routine-abscess     Status: None   Collection Time: 12/05/13 10:37 PM  Result Value Ref Range Status   Specimen Description ABSCESS  Final   Special Requests   Final    BILATERAL ASPIRATION OF BRAIN ABSCESS SPECIMEN NO 2   Gram Stain   Final    ABUNDANT WBC PRESENT, PREDOMINANTLY PMN NO SQUAMOUS EPITHELIAL CELLS SEEN MODERATE GRAM POSITIVE COCCI IN PAIRS Performed at [facility]    Culture   Final    FEW MICROAEROPHILIC STREPTOCOCCI Note: Standardized susceptibility testing for this organism is not available. Performed at [facility]    Report Status 12/09/2013 FINAL  Final  Culture, routine-abscess     Status: None   Collection Time: 12/05/13 10:37 PM  Result Value Ref Range Status   Specimen Description ABSCESS  Final   Special Requests   Final    BILATERAL ASPIRATION OF BRAIN ABSCESS SPECIMEN NO 3   Gram Stain   Final    ABUNDANT WBC PRESENT, PREDOMINANTLY PMN NO SQUAMOUS EPITHELIAL CELLS SEEN MODERATE GRAM POSITIVE COCCI IN PAIRS Performed at [facility]     Culture   Final    MODERATE MICROAEROPHILIC STREPTOCOCCI Note: Standardized susceptibility testing for this organism is not available. Performed at [facility]    Report Status 12/09/2013 FINAL  Final  Anaerobic culture     Status: None   Collection Time: 12/05/13 10:37 PM  Result Value Ref Range Status   Specimen Description ABSCESS  Final   Special Requests BILATERAL ASPIRATION OF BRAIN ABSCESS  Final   Gram Stain   Final    ABUNDANT WBC PRESENT, PREDOMINANTLY PMN NO SQUAMOUS EPITHELIAL CELLS SEEN MODERATE GRAM POSITIVE COCCI IN PAIRS Performed at [facility]    Culture   Final    NO ANAEROBES ISOLATED Performed at [facility]    Report Status 12/12/2013 FINAL  Final  Clostridium Difficile by PCR     Status: None   Collection Time: 12/11/13  5:32 PM  Result Value Ref Range Status   C difficile by pcr NEGATIVE NEGATIVE Final   Studies/Results: Ir Gastrostomy Tube Mod Sed  12/14/2013   INDICATION: History of COPD, CVA and substance abuse, admitted with brain abscess, post aspiration, now with dysphagia and failed swallowed study. Request made for percutaneous gastrostomy tube placement for enteric nutrition supplementation.  EXAM: PULL TROUGH GASTROSTOMY TUBE PLACEMENT  COMPARISON:  CT abdomen pelvis -12/02/2018  MEDICATIONS: Patient is currently admitted to the hospital and receiving intravenous antibiotics. The antibiotics were administered within an appropriate time frame prior  to the initiation of the procedure.  CONTRAST:  20 mL of Isovue 300 administered into the gastric lumen.  ANESTHESIA/SEDATION: Versed 2 mg IV; Fentanyl 100 mcg IV  Sedation time  20 minutes  FLUOROSCOPY TIME:  7 minutes 48 seconds.  (4,462 mGy)  COMPLICATIONS: None immediate  PROCEDURE: Informed written consent was obtained from the patient following explanation of the procedure, risks, benefits and alternatives. A time out was performed prior to the initiation of the procedure.  Ultrasound scanning was performed to demarcate the edge of the left lobe of the liver. Maximal barrier sterile technique utilized including caps, mask, sterile gowns, sterile gloves, large sterile drape, hand hygiene and Betadine prep.  The left upper quadrant was sterilely prepped and draped. An oral gastric catheter was inserted into the stomach under fluoroscopy. The left costal margin and air opacified transverse colon were identified and avoided. Air was injected into the stomach for insufflation and visualization under fluoroscopy. Appropriate percutaneous access site was confirmed with the acquisition of cross-table lateral radiographic images. Under sterile conditions a 17 gauge trocar needle was utilized to access the stomach percutaneously beneath the left subcostal margin after the overlying soft tissues were anesthetized with 1% Lidocaine with epinephrine. Needle position was confirmed within the stomach with aspiration of air and injection of small amount of contrast. A single T tack was deployed for gastropexy. Over an Amplatz guide wire, a 9-French sheath was inserted into the stomach. A snare device was utilized to capture the oral gastric catheter. The snare device was pulled retrograde from the stomach up the esophagus and out the oropharynx. The 20-French pull-through gastrostomy was connected to the snare device and pulled antegrade through the oropharynx down the esophagus into the stomach and then through the percutaneous tract external to the patient. The gastrostomy was assembled externally. Contrast injection confirms position in the stomach. Several spot radiographic images were obtained in various obliquities for documentation. The patient tolerated procedure well without immediate post procedural complication.  FINDINGS: After successful fluoroscopic guided placement, the gastrostomy tube is appropriately positioned with internal disc against the ventral aspect of the gastric lumen.   IMPRESSION: Successful fluoroscopic insertion of a 20-French pull-through gastrostomy tube.  The gastrostomy may be used immediately for medication administration and in 24 hrs for the initiation of feeds.   Electronically Signed   By: Sandi Mariscal M.D.   On: 12/14/2013 14:08   Medications: I have reviewed the patient's current medications. Scheduled Meds: . antiseptic oral rinse  7 mL Mouth Rinse q12n4p  . cefTRIAXone (ROCEPHIN)  IV  2 g Intravenous Q12H  . chlorhexidine  15 mL Mouth Rinse BID  . dexamethasone  4 mg Intravenous 4 times per day  . famotidine  10 mg Oral BID  . fenofibrate  54 mg Oral Daily  . fluconazole  100 mg Per Tube Daily  . levETIRAcetam  500 mg Intravenous Q12H  . metronidazole  500 mg Intravenous Q6H  . mirtazapine  7.5 mg Per Tube QHS  . mometasone-formoterol  2 puff Inhalation BID  . multivitamin with minerals  1 tablet Oral Daily  . neomycin-bacitracin-polymyxin   Topical BID  . nicotine  21 mg Transdermal Q24H  . pantoprazole (PROTONIX) IV  40 mg Intravenous QHS  . polyethylene glycol  17 g Oral BID   Continuous Infusions: . sodium chloride 20 mL/hr (12/15/13 1047)  . feeding supplement (JEVITY 1.2 CAL) 1,000 mL (12/15/13 1413)   PRN Meds:.acetaminophen, albuterol, clonazePAM, diclofenac sodium, hydrocortisone cream, hydrOXYzine, ipratropium-albuterol,  labetalol, morphine, ondansetron **OR** ondansetron (ZOFRAN) IV, promethazine, sodium chloride   Assessment/Plan: Principal Problem:   Brain abscess Active Problems:   Dyslipidemia   Anxiety state   HYPERTENSION, BENIGN ESSENTIAL   Peripheral vascular disease   COPD (chronic obstructive pulmonary disease)   Tobacco abuse   Abnormality of gait   Loss of weight   Malnutrition   Pneumonia   Heroin abuse   Protein-calorie malnutrition, severe   DNR (do not resuscitate) discussion   Pain in back   Weakness generalized   Palliative care encounter   Dysphagia  Brain abscesses: culture positive for  few microaerophilic streptococci. Leukocytosis downtrending - Decadron 4mg  Q6hr - keppra 500mg  Q12hr - ID following, cont flagyl 500mg  Q6hr and rocephin 2g Q12hr (end date 1/14) - Potential SNF found. Sitter d/c'd. CIR consulted. - PICC line in place  Dysphagia: s/p IR G tube placement - TF starting today per nutrition note  Pneumonia: 3.6cm well defined nodule in left upper lobe that could represent metatstatic disease if pt has primary malignancy seen on chest CT on admisison. Likely due to scarring upon review w/ Dr. Beryle Beams - consider repeat Chest CT before pt is discharged  - abx as noted above   Lower extremity pain and weakness: minimal disc bulging noted along with chronic T4 compression fracture. - Morphine 5mg  per tube Q4hr prn - will cont to monitor - PT/OT working w/ patient - CIR consulted - foley discontinued  Pharyngeal candidiasis: pt received one dose of amp B on 11/24 - diflucan 100mg  daily x 21 days (end date 12/18)  Polysubstance Abuse: - nicotine patch - Morphine 5mg  per tube Q4hr prn  COPD:  - Continue albuterol 2.5mg  Q6hr prn - Continue duoneb 55ml Q6hr prn - Continue Dulera 100-44mcg 2 puff BID  Anxiety:  - Continue klonopin 0.25mg  BID  - Remeron 7.5mg  QHS for sleep  DMII:  - SSI-s - continue CBG checks Q4hr  Essential Hypertension: normotensive. Will continue to monitor.  FEN:  - TF  DVT Ppx:  - SCDs  Dispo: Disposition is deferred at this time, awaiting improvement of current medical problems.  Anticipated discharge in approximately 1-2 day(s).   The patient does have a current PCP (Kelby Aline, MD) and does need an [facility] hospital follow-up appointment after discharge.  The patient does not have transportation limitations that hinder transportation to clinic appointments.  .Services Needed at time of discharge: Y = Yes, Blank = No PT:   OT: SNF  RN:   Equipment:   Other:     LOS: 15 days   Jacques Earthly,  MD 12/15/2013, 4:52 PM

## 2013-12-16 LAB — GLUCOSE, CAPILLARY
Glucose-Capillary: 131 mg/dL — ABNORMAL HIGH (ref 70–99)
Glucose-Capillary: 145 mg/dL — ABNORMAL HIGH (ref 70–99)
Glucose-Capillary: 148 mg/dL — ABNORMAL HIGH (ref 70–99)
Glucose-Capillary: 158 mg/dL — ABNORMAL HIGH (ref 70–99)
Glucose-Capillary: 160 mg/dL — ABNORMAL HIGH (ref 70–99)
Glucose-Capillary: 160 mg/dL — ABNORMAL HIGH (ref 70–99)

## 2013-12-16 LAB — CBC
HCT: 42.4 % (ref 39.0–52.0)
HEMOGLOBIN: 14.4 g/dL (ref 13.0–17.0)
MCH: 27.2 pg (ref 26.0–34.0)
MCHC: 34 g/dL (ref 30.0–36.0)
MCV: 80 fL (ref 78.0–100.0)
Platelets: 276 10*3/uL (ref 150–400)
RBC: 5.3 MIL/uL (ref 4.22–5.81)
RDW: 16.3 % — ABNORMAL HIGH (ref 11.5–15.5)
WBC: 11.2 10*3/uL — ABNORMAL HIGH (ref 4.0–10.5)

## 2013-12-16 LAB — BASIC METABOLIC PANEL
Anion gap: 12 (ref 5–15)
BUN: 35 mg/dL — AB (ref 6–23)
CO2: 26 mEq/L (ref 19–32)
CREATININE: 0.58 mg/dL (ref 0.50–1.35)
Calcium: 9.5 mg/dL (ref 8.4–10.5)
Chloride: 95 mEq/L — ABNORMAL LOW (ref 96–112)
GFR calc non Af Amer: >90 mL/min (ref >90)
Glucose, Bld: 148 mg/dL — ABNORMAL HIGH (ref 70–99)
Potassium: 4.9 mEq/L (ref 3.7–5.3)
Sodium: 133 mEq/L — ABNORMAL LOW (ref 137–147)

## 2013-12-16 NOTE — Clinical Social Work Note (Signed)
CSW contacted by Juliann Pulse at [facility]. CSW made aware facility unable to accept patient this weekend, but will re-evaluate for admission on Monday, 12/7. CSW spoke with MD Redmond Pulling) who states patient is currently being evaluated by CIR for admission. CIR scheduled to see patient on Monday, 12/7. CSW MD made aware. Patient's RN Engineer, civil (consulting)) and charge RN Ihor Dow) made aware. Patient made aware. CSW to follow on Monday.  [place], [place] Weekend Clinical Social Worker [phone]

## 2013-12-16 NOTE — Progress Notes (Signed)
Patient called out stating his pain was "10" . RN at bedside.PT is resting comfortably and asleep at this time. Will continue to monitor patient

## 2013-12-16 NOTE — Clinical Social Work Note (Signed)
CSW contacted by Anne Ng of [facility]. Per Anne Ng, facility can admit patient on Sunday, 12/6. Anne Ng states d/c summary and copy of prescriptions must be faxed [phone]) to facility as early as possible, as 1 pm is cut off time for pharmacy. CSW instructed to contact supervisor Lou Cal to assist with d/c to facility. CSW discussed the above with patient's RN Marge, and made MD(Wilson) and charge RN Ihor Dow) aware of the above. CSW spoke with patient who continues to be agreeable to d/c plan. CSW to follow tomorrow.   [place], [place] Weekend Clinical Social Worker [phone]

## 2013-12-16 NOTE — Progress Notes (Signed)
Subjective: [name] says he feels better today than he has since admission.  He denies CP or dyspnea.  He is asking for his pain medication.  He was able to sleep after receiving Remeron last night.  Objective: Vital signs in last 24 hours: Filed Vitals:   12/15/13 1312 12/15/13 2042 12/15/13 2152 12/16/13 0704  BP: 145/76  147/88 151/71  Pulse: 73   58  Temp: 97.5 F (36.4 C)  97.6 F (36.4 C) 97.6 F (36.4 C)  TempSrc: Axillary  Oral Oral  Resp: 18  16 16   Height:      Weight:      SpO2: 95% 95% 99% 100%   Weight change:   Intake/Output Summary (Last 24 hours) at 12/16/13 1017 Last data filed at 12/16/13 5102  Gross per 24 hour  Intake      0 ml  Output   2250 ml  Net  -2250 ml   General: NAD, cachetic Lungs: CTAB Cardiac: RRR, S1/S2 GI: soft, scaphoid abdomen, minimally tender, PEG site without erythema and dressing is c/d/i Ext: no edema,  Neuro:  AAO x3, responding appropriately, following commands, able to grip hands b/l, able to lift bilateral legs off bed, strength 3/5 equal B/L  Lab Results: Basic Metabolic Panel:  Recent Labs Lab 12/13/13 0441 12/14/13 0532  NA 138 138  K 5.1 5.2  CL 100 99  CO2 25 26  GLUCOSE 190* 134*  BUN 45* 43*  CREATININE 0.72 0.66  CALCIUM 9.1 9.6    CBC:  Recent Labs Lab 12/14/13 0532 12/15/13 0529  WBC 17.2* 12.0*  HGB 14.0 13.5  HCT 42.9 40.5  MCV 81.1 80.4  PLT 336 298   CBG:  Recent Labs Lab 12/15/13 1117 12/15/13 1657 12/15/13 2036 12/16/13 0023 12/16/13 0354 12/16/13 0752  GLUCAP 110* 116* 128* 145* 160* 148*   Urine Drug Screen: Drugs of Abuse     Component Value Date/Time   LABOPIA POSITIVE* 12/01/2013 0533   LABOPIA NEG 06/01/2011 1115   COCAINSCRNUR NONE DETECTED 12/01/2013 0533   COCAINSCRNUR NEG 06/01/2011 1115   LABBENZ NONE DETECTED 12/01/2013 0533   LABBENZ NEG 06/01/2011 1115   LABBENZ NEG 02/18/2009 2050   AMPHETMU NONE DETECTED 12/01/2013 0533   AMPHETMU NEG 06/01/2011  1115   AMPHETMU NEG 02/18/2009 2050   THCU NONE DETECTED 12/01/2013 0533   THCU NEG 06/01/2011 1115   LABBARB NONE DETECTED 12/01/2013 0533   LABBARB NEG 06/01/2011 1115    Micro Results: Recent Results (from the past 240 hour(s))  Clostridium Difficile by PCR     Status: None   Collection Time: 12/11/13  5:32 PM  Result Value Ref Range Status   C difficile by pcr NEGATIVE NEGATIVE Final   Studies/Results: Ir Gastrostomy Tube Mod Sed  12/14/2013   INDICATION: History of COPD, CVA and substance abuse, admitted with brain abscess, post aspiration, now with dysphagia and failed swallowed study. Request made for percutaneous gastrostomy tube placement for enteric nutrition supplementation.  EXAM: PULL TROUGH GASTROSTOMY TUBE PLACEMENT  COMPARISON:  CT abdomen pelvis -12/02/2018  MEDICATIONS: Patient is currently admitted to the hospital and receiving intravenous antibiotics. The antibiotics were administered within an appropriate time frame prior to the initiation of the procedure.  CONTRAST:  20 mL of Isovue 300 administered into the gastric lumen.  ANESTHESIA/SEDATION: Versed 2 mg IV; Fentanyl 100 mcg IV  Sedation time  20 minutes  FLUOROSCOPY TIME:  7 minutes 48 seconds.  (5,852 mGy)  COMPLICATIONS: None immediate  PROCEDURE: Informed written consent was obtained from the patient following explanation of the procedure, risks, benefits and alternatives. A time out was performed prior to the initiation of the procedure. Ultrasound scanning was performed to demarcate the edge of the left lobe of the liver. Maximal barrier sterile technique utilized including caps, mask, sterile gowns, sterile gloves, large sterile drape, hand hygiene and Betadine prep.  The left upper quadrant was sterilely prepped and draped. An oral gastric catheter was inserted into the stomach under fluoroscopy. The left costal margin and air opacified transverse colon were identified and avoided. Air was injected into the stomach  for insufflation and visualization under fluoroscopy. Appropriate percutaneous access site was confirmed with the acquisition of cross-table lateral radiographic images. Under sterile conditions a 17 gauge trocar needle was utilized to access the stomach percutaneously beneath the left subcostal margin after the overlying soft tissues were anesthetized with 1% Lidocaine with epinephrine. Needle position was confirmed within the stomach with aspiration of air and injection of small amount of contrast. A single T tack was deployed for gastropexy. Over an Amplatz guide wire, a 9-French sheath was inserted into the stomach. A snare device was utilized to capture the oral gastric catheter. The snare device was pulled retrograde from the stomach up the esophagus and out the oropharynx. The 20-French pull-through gastrostomy was connected to the snare device and pulled antegrade through the oropharynx down the esophagus into the stomach and then through the percutaneous tract external to the patient. The gastrostomy was assembled externally. Contrast injection confirms position in the stomach. Several spot radiographic images were obtained in various obliquities for documentation. The patient tolerated procedure well without immediate post procedural complication.  FINDINGS: After successful fluoroscopic guided placement, the gastrostomy tube is appropriately positioned with internal disc against the ventral aspect of the gastric lumen.  IMPRESSION: Successful fluoroscopic insertion of a 20-French pull-through gastrostomy tube.  The gastrostomy may be used immediately for medication administration and in 24 hrs for the initiation of feeds.   Electronically Signed   By: Sandi Mariscal M.D.   On: 12/14/2013 14:08   Medications: I have reviewed the patient's current medications. Scheduled Meds: . antiseptic oral rinse  7 mL Mouth Rinse q12n4p  . cefTRIAXone (ROCEPHIN)  IV  2 g Intravenous Q12H  . chlorhexidine  15 mL Mouth  Rinse BID  . dexamethasone  4 mg Intravenous 4 times per day  . famotidine  10 mg Oral BID  . fenofibrate  54 mg Oral Daily  . fluconazole  100 mg Per Tube Daily  . levETIRAcetam  500 mg Intravenous Q12H  . metronidazole  500 mg Intravenous Q6H  . mirtazapine  7.5 mg Per Tube QHS  . mometasone-formoterol  2 puff Inhalation BID  . multivitamin with minerals  1 tablet Oral Daily  . neomycin-bacitracin-polymyxin   Topical BID  . nicotine  21 mg Transdermal Q24H  . pantoprazole (PROTONIX) IV  40 mg Intravenous QHS  . polyethylene glycol  17 g Oral BID   Continuous Infusions: . sodium chloride 20 mL/hr (12/15/13 1047)  . 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 Leukocytosis downtrending.  Appreciate ID recommendations.  SNF vs CIR.  He has been evaluated by [facility] physician for CIR eligibility.  CIR admission process to continue on Monday. -  Decadron 4mg  Q6hr - Keppra 500mg  Q12hr - Cont flagyl 500mg  Q6hr and rocephin 2g Q12hr (end date 1/14) via PICC - SNF vs CIR placement pending   Dysphagia: s/p IR G tube placement.  Appreciate nutrition recommendations. - continue TF  Pneumonia: 3.6cm well defined nodule in left upper lobe that could represent metatstatic disease if pt has primary malignancy seen on chest CT on admisison. Likely due to scarring upon review w/ Dr. Beryle Beams - repeat Chest CT in 3-4 weeks - abx as noted above   Lower extremity pain and weakness: minimal disc bulging noted along with chronic T4 compression fracture.  Improving.   - Morphine 5mg  per tube Q4hr prn - will cont to monitor - PT/OT working w/ patient - CIR  consulted  Pharyngeal candidiasis: pt received one dose of amp B on 11/24 - continue diflucan 100mg  daily x 21 days (end date 12/18)  Polysubstance Abuse: - nicotine patch - continue Morphine 5mg  per tube Q4hr prn  COPD:  Stable  - Continue albuterol 2.5mg  Q6hr prn - Continue duoneb 29ml Q6hr prn - Continue Dulera 100-20mcg 2 puff BID  Anxiety:  - Continue klonopin 0.25mg  BID  - continue Remeron 7.5mg  QHS for sleep  DMII: Stable  - SSI-s - continue CBG checks Q4hr  Essential Hypertension: normotensive. Will continue to monitor.  FEN:  - TF  DVT Ppx:  - SCDs  Dispo: Disposition is deferred at this time, awaiting improvement of current medical problems.  Anticipated discharge in approximately 1-2 day(s).   The patient does have a current PCP (Kelby Aline, MD) and does need an [facility] hospital follow-up appointment after discharge.  The patient does not have transportation limitations that hinder transportation to clinic appointments.  .Services Needed at time of discharge: Y = Yes, Blank = No PT:   OT: SNF  RN:   Equipment:   Other:     LOS: 16 days   Francesca Oman, DO 12/16/2013, 8:32 AM

## 2013-12-16 NOTE — Clinical Social Work Note (Signed)
CSW continues to follow this patient for d/c planning needs. CSW spoke with MD Redmond Pulling) who reports patient medically stable for d/c. CSW met with patient who was sitting calmly in bed and alert and oriented. Patient is agreeable to d/c plan. CSW contacted [facility] and spoke with Juliann Pulse. Per Juliann Pulse, she is uncertain of bed availability for patient. CSW made [facility] aware bed approved by Scientist, physiological at facility. Juliann Pulse reports she has to speak with nursing director for patient admission to facility if bed is available. CSW verbalized understanding and made RN [facility]) aware of the above. CSW to continue to follow.  [place], [employer] Weekend Clinical Social Worker [phone]

## 2013-12-16 NOTE — Plan of Care (Signed)
Problem: Phase II Progression Outcomes Goal: Discharge plan established Outcome: Progressing Goal: Vital signs remain stable Outcome: Completed/Met Date Met:  12/16/13  Problem: Phase III Progression Outcomes Goal: Pain controlled on oral analgesia Outcome: Not Applicable Date Met:  03/75/43

## 2013-12-16 NOTE — Progress Notes (Signed)
Patient requesting Klonopin at this time. Prn not due now. Dr. Arcelia Jew notified, stated ok to give prn klonopin now.

## 2013-12-17 ENCOUNTER — Inpatient Hospital Stay
Admission: RE | Admit: 2013-12-17 | Discharge: 2014-01-01 | Disposition: A | Discharge status: Other Acute Inpatient Hospital | Payer: Medicaid Other | Source: Ambulatory Visit | Attending: Internal Medicine | Admitting: Internal Medicine

## 2013-12-17 DIAGNOSIS — R911 Solitary pulmonary nodule: Principal | ICD-10-CM

## 2013-12-17 DIAGNOSIS — R1310 Dysphagia, unspecified: Secondary | ICD-10-CM

## 2013-12-17 LAB — BASIC METABOLIC PANEL
ANION GAP: 11 (ref 5–15)
BUN: 34 mg/dL — ABNORMAL HIGH (ref 6–23)
CHLORIDE: 97 meq/L (ref 96–112)
CO2: 25 meq/L (ref 19–32)
Calcium: 9.1 mg/dL (ref 8.4–10.5)
Creatinine, Ser: 0.61 mg/dL (ref 0.50–1.35)
GFR calc Af Amer: >90 mL/min (ref >90)
GFR calc non Af Amer: >90 mL/min (ref >90)
Glucose, Bld: 201 mg/dL — ABNORMAL HIGH (ref 70–99)
POTASSIUM: 4.7 meq/L (ref 3.7–5.3)
SODIUM: 133 meq/L — AB (ref 137–147)

## 2013-12-17 LAB — CBC
HEMATOCRIT: 40.6 % (ref 39.0–52.0)
HEMOGLOBIN: 13.5 g/dL (ref 13.0–17.0)
MCH: 26.6 pg (ref 26.0–34.0)
MCHC: 33.3 g/dL (ref 30.0–36.0)
MCV: 79.9 fL (ref 78.0–100.0)
Platelets: 224 10*3/uL (ref 150–400)
RBC: 5.08 MIL/uL (ref 4.22–5.81)
RDW: 16.2 % — ABNORMAL HIGH (ref 11.5–15.5)
WBC: 9.2 10*3/uL (ref 4.0–10.5)

## 2013-12-17 LAB — GLUCOSE, CAPILLARY
GLUCOSE-CAPILLARY: 160 mg/dL — AB (ref 70–99)
Glucose-Capillary: 181 mg/dL — ABNORMAL HIGH (ref 70–99)
Glucose-Capillary: 197 mg/dL — ABNORMAL HIGH (ref 70–99)
Glucose-Capillary: 198 mg/dL — ABNORMAL HIGH (ref 70–99)

## 2013-12-17 MED ORDER — MIRTAZAPINE 7.5 MG PO TABS: 7.5000 mg | ORAL_TABLET | Freq: Every day | ORAL | Status: DC

## 2013-12-17 MED ORDER — METRONIDAZOLE IN NACL 5-0.79 MG/ML-% IV SOLN: 500.0000 mg | Freq: Four times a day (QID) | INTRAVENOUS | Status: DC

## 2013-12-17 MED ORDER — CEFTRIAXONE SODIUM IN DEXTROSE 40 MG/ML IV SOLN: 2.0000 g | Freq: Two times a day (BID) | INTRAVENOUS | Status: DC

## 2013-12-17 MED ORDER — FLUCONAZOLE 100 MG PO TABS: 100.0000 mg | ORAL_TABLET | Freq: Every day | ORAL | Status: AC

## 2013-12-17 MED ORDER — INSULIN ASPART 100 UNIT/ML ~~LOC~~ SOLN: 0.0000 [IU] | Freq: Three times a day (TID) | SUBCUTANEOUS | Status: DC

## 2013-12-17 MED ORDER — HEPARIN SOD (PORK) LOCK FLUSH 100 UNIT/ML IV SOLN
250.0000 [IU] | INTRAVENOUS | Status: AC | PRN
  Administered 2013-12-17: 250 [IU]

## 2013-12-17 MED ORDER — MORPHINE SULFATE 10 MG/5ML PO SOLN: 5.0000 mg | ORAL | Status: DC | PRN

## 2013-12-17 MED ORDER — DEXAMETHASONE SODIUM PHOSPHATE 4 MG/ML IJ SOLN: 4.0000 mg | Freq: Three times a day (TID) | INTRAMUSCULAR | Status: DC

## 2013-12-17 MED ORDER — CLONAZEPAM 0.5 MG PO TABS: 0.2500 mg | ORAL_TABLET | Freq: Two times a day (BID) | ORAL | Status: DC | PRN

## 2013-12-17 MED ORDER — JEVITY 1.2 CAL PO LIQD: 1000.0000 mL | ORAL | Status: DC

## 2013-12-17 MED ORDER — LEVETIRACETAM IN NACL 500 MG/100ML IV SOLN: 500.0000 mg | Freq: Two times a day (BID) | INTRAVENOUS | Status: DC

## 2013-12-17 MED ORDER — CLONAZEPAM 0.5 MG PO TABS: 0.5000 mg | ORAL_TABLET | Freq: Two times a day (BID) | ORAL | Status: DC | PRN

## 2013-12-17 NOTE — Progress Notes (Signed)
Report called to Lou Cal.  Pt taken via EMS.

## 2013-12-17 NOTE — Clinical Social Work Note (Signed)
CSW continues to follow patient for d/c planning needs. CSW contacted facility and spoke with Lou Cal who confirmed bed availability. CSW met with patient who is agreeable to d/c plan. CSW faxed d/c summary and RX  to facility and prepared d/c packet. CSW placed d/c packet in patient's shadow chart. CSW provided RN (JZ) with number for report. CSW to arrange transportation via [place]. No further needs. CSW signing off.   [place], [place] Weekend Clinical Social Worker [phone]

## 2013-12-17 NOTE — Progress Notes (Signed)
CARE MANAGEMENT NOTE 12/17/2013  Patient:  [name], POSS   Account Number:  [id number]  Date Initiated:  12/04/2013  Documentation initiated by:  Olga Coaster  Subjective/Objective Assessment:   ADMITTED WITH BACK FRACTURE     Action/Plan:   CM FOLLOWING FOR DCP   Anticipated DC Date:  12/17/2013   Anticipated DC Plan:  SKILLED NURSING FACILITY  In-house referral  Clinical Social Worker      DC Planning Services  CM consult      Choice offered to / List presented to:             Status of service:  Completed, signed off Medicare Important Message given?  YES (If response is "NO", the following Medicare IM given date fields will be blank) Date Medicare IM given:  12/08/2013 Medicare IM given by:  Marylyn Ishihara Date Additional Medicare IM given:   Additional Medicare IM given by:    Discharge Disposition:  [place]  Per UR Regulation:  Reviewed for med. necessity/level of care/duration of stay  If discussed at [place] of Stay Meetings, dates discussed:    Comments:  11/23/2015Mindi Slicker RN,BSN,MHA [phone]

## 2013-12-17 NOTE — Discharge Summary (Signed)
Patient Name:  [name]  MRN: [id number]  PCP: Lottie Mussel, MD  DOB:  [birth year]       Date of Admission:  11/30/2013  Date of Discharge:  12/17/2013      Attending Physician: Dr. Madilyn Fireman, MD         DISCHARGE DIAGNOSES: 1.   Brain abscess 2.   Type 2 diabetes mellitus 3.   Dyslipidemia 4.   Anxiety state 5.   Hypertension 6.   Peripheral vascular disease 7.   COPD (chronic obstructive pulmonary disease) 8.   Tobacco abuse 9.   Abnormality of gait 10.   Pneumonia 11.   Heroin abuse 12.   Protein-calorie malnutrition, severe 13.   Pain in back 14.   Weakness generalized 15.   Dysphagia    DISPOSITION AND FOLLOW-UP: [name] is to follow-up with the listed providers as detailed below, at patient's visiting, please address following issues:  1) Follow-up final AFB and fungal cultures. 2) He will need follow-up MRI or CT 3) He will need continued tapering of decadron 4) Insulin requirement will likely decrease as decadron is tapered 5) He will need follow-up CT of chest to evaluate lung nodule 6) Holding ASA and plavix until he gets follow-up imaging.    Follow-up Information    Follow up with [name] Basques, MD. Go on 01/17/2014.   Specialty:  Infectious Diseases   Why:  2 PM   Contact information:   [name] [id number] [phone]       Follow up with [name] Newport, MD.   Specialty:  Neurosurgery   Why:  the office will call you   Contact information:   [address] [place] [address] [phone]       Follow up with Lottie Mussel, MD.   Specialty:  Internal Medicine   Why:  the office will call you   Contact information:   [place] [address] [phone]         DISCHARGE MEDICATIONS:   Medication List    STOP taking these medications        aspirin EC 81 MG tablet     clopidogrel 75 MG tablet  Commonly known as:  PLAVIX     hydrocortisone  cream 1 %     ranitidine 75 MG tablet  Commonly known as:  ZANTAC      TAKE these medications        albuterol (2.5 MG/3ML) 0.083% nebulizer solution  Commonly known as:  PROVENTIL  Take 3 mLs (2.5 mg total) by nebulization every 6 (six) hours as needed for wheezing or shortness of breath.     albuterol 108 (90 BASE) MCG/ACT inhaler  Commonly known as:  PROVENTIL HFA;VENTOLIN HFA  Inhale 2 puffs into the lungs every 6 (six) hours as needed for wheezing or shortness of breath.     cefTRIAXone 40 MG/ML IVPB  Commonly known as:  ROCEPHIN  Inject 50 mLs (2 g total) into the vein every 12 (twelve) hours.     clonazePAM 0.5 MG tablet  Commonly known as:  KLONOPIN  Take 0.5 tablets (0.25 mg total) by mouth 2 (two) times daily as needed for anxiety.     COMBIVENT RESPIMAT 20-100 MCG/ACT Aers respimat  Generic drug:  Ipratropium-Albuterol  Inhale 1 puff into the lungs every 6 (six) hours as needed for wheezing or shortness of breath ((May take up to  6 puffs daily if needed.).     dexamethasone 4 MG/ML injection  Commonly known as:  DECADRON  Inject 1 mL (4 mg total) into the vein every 8 (eight) hours.     diclofenac sodium 1 % Gel  Commonly known as:  VOLTAREN  Apply 2 g topically 4 (four) times daily as needed (pain). Apply 1 application topically to knees 4 times per day as needed for pain     feeding supplement (JEVITY 1.2 CAL) Liqd  Place 1,000 mLs into feeding tube continuous.     fenofibrate 48 MG tablet  Commonly known as:  TRICOR  Take 48 mg by mouth daily.     fluconazole 100 MG tablet  Commonly known as:  DIFLUCAN  Place 1 tablet (100 mg total) into feeding tube daily.  Start taking on:  12/18/2013     Fluticasone-Salmeterol 100-50 MCG/DOSE Aepb  Commonly known as:  ADVAIR DISKUS  Inhale 1 puff into the lungs 2 (two) times daily.     hydrOXYzine 25 MG tablet  Commonly known as:  ATARAX/VISTARIL  Take 1 tablet (25 mg total) by mouth 2 (two) times daily as needed  for itching.     insulin aspart 100 UNIT/ML injection  Commonly known as:  novoLOG  Inject 0-9 Units into the skin 3 (three) times daily with meals.     levETIRAcetam 500 MG/100ML Soln  Commonly known as:  KEPRRA  Inject 100 mLs (500 mg total) into the vein every 12 (twelve) hours.     metroNIDAZOLE 5-0.79 MG/ML-% IVPB  Commonly known as:  FLAGYL  Inject 100 mLs (500 mg total) into the vein every 6 (six) hours.     mirtazapine 7.5 MG tablet  Commonly known as:  REMERON  Place 1 tablet (7.5 mg total) into feeding tube at bedtime.     morphine 10 MG/5ML solution  Place 2.5 mLs (5 mg total) into feeding tube every 4 (four) hours as needed for severe pain.     multivitamin with minerals Tabs tablet  Take 1 tablet by mouth daily.     nicotine 21 mg/24hr patch  Commonly known as:  NICODERM CQ - dosed in mg/24 hours  Place 1 patch (21 mg total) onto the skin daily.     pantoprazole 40 MG tablet  Commonly known as:  PROTONIX  Take 40 mg by mouth 2 (two) times daily.       Please taper Decadron: 12/06-12/11 4mg  three times daily 12/12-12/17 4mg  twice daily 12/18-12/23 2mg  twice daily 12/24-12/29 1mg  twice daily 12/30-01/04 1mg  once daily   CONSULTS:  Gastroenterology Infectious Disease Neurosurgery Palliative Care Physical Medicine and Rehabilitation Psychiatry Radiology Wound Care    PROCEDURES PERFORMED:  Dg Chest 2 View  11/30/2013   CLINICAL DATA:  Fall.  Cough.  EXAM: CHEST  2 VIEW  COMPARISON:  10/27/2013  FINDINGS: Persistent opacity in the right lung base appears to be increasing in size since previous study. This may represent progression of pneumonia. Follow-up until resolution is recommended to exclude underlying obstructing lesion. Normal heart size and pulmonary vascularity. No blunting of costophrenic angles. No pneumothorax. Old fracture deformities of left upper ribs. Postoperative changes in the cervical spine. Degenerative changes in the thoracic  spine.  IMPRESSION: Persistent right middle lung pneumonia. Follow-up until resolution is recommended. No new abnormalities since prior study.   Electronically Signed   By: Lucienne Capers M.D.   On: 11/30/2013 21:59   Dg Pelvis 1-2 Views  11/30/2013   CLINICAL  DATA:  LEFT hip pain, tripped and fell today  EXAM: PELVIS - 1-2 VIEW  COMPARISON:  None  FINDINGS: Osseous mineralization grossly normal.  Hip and SI joint spaces preserved.  Patient rotated to the LEFT.  No acute fracture, dislocation or bone destruction.  Surgical clips at RIGHT inguinal region.  Scattered atherosclerotic calcifications.  IMPRESSION: No acute osseous abnormalities.   Electronically Signed   By: Lavonia Dana M.D.   On: 11/30/2013 21:58   Ct Head Wo Contrast  12/06/2013   CLINICAL DATA:  Brain abscess status post aspiration  EXAM: CT HEAD WITHOUT CONTRAST  TECHNIQUE: Contiguous axial images were obtained from the base of the skull through the vertex without intravenous contrast.  COMPARISON:  Head CT 12/04/2013  FINDINGS: New bifrontal bur holes for surgical access. No acute osseous findings.  No mastoid or sinus opacification.  Three brain abscesses status post decompression:  *A high right frontal white matter abscess is decompressed, currently 23 mm in diameter, previously 27 mm. *An abscess centered in the right anterior putamen is also smaller, currently 28 mm in diameter, previously 40mm. There is an associated decrease in mass effect on the frontal horn of the wall right lateral ventricle and decrease in edema. *A peripheral high and posterior left frontal abscess is stable at 25 mm. This cavity contains gas, fluid, and a tiny focus of postoperative hemorrhage.  Mild decreased in vasogenic edema. Leftward shift it is currently 4 mm. No evidence of intraventricular debris or hydrocephalus. No acute infarct. Postoperative pneumocephalus.  IMPRESSION: 1. Three cerebral abscesses status post decompression. Vasogenic edema and mass  effect is improving. 2. Tiny postoperative hemorrhage in the left frontal cavity. 3. No ventriculomegaly or ventricular debris.   Electronically Signed   By: Jorje Guild M.D.   On: 12/06/2013 05:17   Ct Head Wo Contrast  12/04/2013   CLINICAL DATA:  Altered mental status.  Multiple brain lesions.  EXAM: CT HEAD WITHOUT CONTRAST  TECHNIQUE: Contiguous axial images were obtained from the base of the skull through the vertex without intravenous contrast.  COMPARISON:  MRI dated 12/01/2013 and is CT scan dated 11/30/2013  FINDINGS: There has been rapidly progression in the apparent size of brain lesions in the right basal ganglia, posterior left frontal lobe, and right frontal centrum semiovale. There is increased edema around each lesion with increased compression of frontal horn of the right lateral ventricle with new midline shift of 3.7 mm.  IMPRESSION: Rapid progression in the size of the multiple brain lesions with due right to left midline shift and increased edema surrounding the lesions. The possibility of progressive brain abscess should be considered particularly in view of the rapid progression.   Electronically Signed   By: Rozetta Nunnery M.D.   On: 12/04/2013 18:51   Ct Head Wo Contrast  11/30/2013   CLINICAL DATA:  [place] today in bathroom, struck back of head on toilet. No loss of consciousness.  EXAM: CT HEAD WITHOUT CONTRAST  CT CERVICAL SPINE WITHOUT CONTRAST  TECHNIQUE: Multidetector CT imaging of the head and cervical spine was performed following the standard protocol without intravenous contrast. Multiplanar CT image reconstructions of the cervical spine were also generated.  COMPARISON:  CT of the head January 26, 2012  FINDINGS: CT HEAD FINDINGS  No intraparenchymal hemorrhage or acute large vascular territory infarct. However, there are new predominately isodense masses within the supratentorial brain including 2.6 x 2.7 cm RIGHT basal ganglia, 1.8 x 1.9 cm RIGHT corpus callosum and 1.9  x 1.9 cm LEFT frontal convexity masses. Surrounding low-density vasogenic edema, including partial effacement of the lateral aspect of the frontal horn of the RIGHT lateral ventricle without hydrocephalus. No midline shift.  No abnormal extra-axial fluid collections. Moderate calcific atherosclerosis of the carotid siphons.  Small LEFT frontal/supraorbital scalp hematoma. No skull fracture. Mild paranasal sinus mucosal thickening without air-fluid levels. Ocular globes and orbital contents are unremarkable.Soft tissue within the external auditory canal likely reflects cerumen.  CT CERVICAL SPINE FINDINGS  Cervical vertebral bodies and posterior elements are intact and aligned and maintenance of cervical lordosis. C4-5 ACDF with solid interbody arthrodesis. Hardware appears intact and well seated without periprosthetic lucency. Mild C3-4 and C5-6 degenerative disc. C1-2 articulation maintained with arthropathy. Moderate calcific atherosclerosis of the carotid siphons.  IMPRESSION: CT HEAD: Small LEFT frontal scalp hematoma. No skull fracture. No acute intracranial process.  At least 3 supratentorial masses, with imaging characteristics concerning for metastasis, though these may reflect subacute hematomas, it would be atypical for all to the of identical chronicity. No midline shift. Recommend MRI of the brain with contrast.  CT CERVICAL SPINE: No acute fracture nor malalignment.  Status post C4-5 ACDF with solid fusion.  Acute findings discussed with and reconfirmed by Dr.FORREST HARRISON on 11/30/2013 at 10:23 pm.   Electronically Signed   By: Elon Alas   On: 11/30/2013 22:24   Ct Head W Contrast  12/05/2013   CLINICAL DATA:  53 year old male with intracranial abscesses. Stealth exam. Pre surgical. Subsequent encounter.  EXAM: CT HEAD WITH CONTRAST  TECHNIQUE: Contiguous axial images were obtained from the base of the skull through the vertex with intravenous contrast.  CONTRAST:  77mL OMNIPAQUE IOHEXOL  300 MG/ML  SOLN  COMPARISON:  12/04/2013 head CT.  12/01/2013 brain MR.  FINDINGS: Three intracranial ring-enhancing lesions suggestive of intracranial abscesses which have increased in size from the recent MR. This includes posterior left frontal - parietal lobe 3 cm ring-enhancing lesion, posterior right frontal lobe 2.7 cm enhancing lesion and right basal ganglia 3.2 cm enhancing lesion.  Marked surrounding vasogenic edema and local mass effect including compression of the right lateral ventricle and bowing of the third ventricle to left.  No evidence of acute thrombotic infarct or intracranial hemorrhage.  IMPRESSION: Three intracranial ring-enhancing lesions suggestive of intracranial abscesses which have increased in size from the recent MR. This includes posterior left frontal - parietal lobe 3 cm ring-enhancing lesion, posterior right frontal lobe 2.7 cm enhancing lesion and right basal ganglia 3.2 cm enhancing lesion.  Marked surrounding vasogenic edema and local mass effect including compression of the right lateral ventricle and bowing of the third ventricle to left.   Electronically Signed   By: Chauncey Cruel M.D.   On: 12/05/2013 11:46   Ct Chest W Contrast  12/01/2013   CLINICAL DATA:  Malignancy.  Possible intracranial metastases.  EXAM: CT CHEST, ABDOMEN, AND PELVIS WITH CONTRAST  TECHNIQUE: Multidetector CT imaging of the chest, abdomen and pelvis was performed following the standard protocol during bolus administration of intravenous contrast.  CONTRAST:  182mL OMNIPAQUE IOHEXOL 300 MG/ML  SOLN  COMPARISON:  CT scan of chest of September 27, 2013. CT scan of head of November 30, 2013.  FINDINGS: CT CHEST FINDINGS  No pneumothorax or pleural effusion is noted. Airspace opacity is noted in the right middle lobe with air bronchograms most consistent with pneumonia. 28 x 14 mm fluid collection is noted within this area of consolidation containing air bubbles consistent with cavitating pneumonia.  3.6  mm nodule is noted in the left upper lobe best seen on image number 18 of series 2. Several healed nodular densities are noted in the right lower lobe most likely representing inflammation or infection. Subcarinal adenopathy measuring 2.4 x 1.8 cm is noted. Pretracheal lymph node measuring 15 x 10 mm is noted. Coronary artery calcifications are noted. Thoracic aorta appears normal. Multiple old left upper rib fractures are noted.  CT ABDOMEN AND PELVIS FINDINGS  No gallstones are noted. Mild splenomegaly is noted. The liver and pancreas appear normal. Adrenal glands and kidneys appear normal. Vascular calcifications are noted involving both kidneys. No hydronephrosis or renal obstruction is noted. No definite renal or ureteral calculi are noted. Atherosclerotic calcifications of abdominal aorta and iliac arteries are noted without aneurysm formation. There is no evidence of bowel obstruction. No abnormal fluid collection is noted in the abdomen or pelvis. No significant adenopathy is noted in the abdomen or pelvis.  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  IMPRESSION: Mild splenomegaly.  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 Sigmoidoscopy is recommended for further evaluation.  Enlarged pretracheal and subcarinal adenopathy is noted. It is uncertain if this cysts inflammatory or metastatic in origin.  Area of consolidation is noted in right middle lobe with associated 2.8 cm fluid collection centrally within this abnormality concerning for cavitating pneumonia. Also noted are multiple ill-defined nodular densities in the right lower lobe most consistent with inflammation or pneumonia. Followup CT scan in 2-3 weeks is recommended to ensure resolution an rule out metastatic disease.  3.6 cm well-defined nodule is noted in the left upper lobe. Potentially this may  represent metastatic disease if the patient does have primary malignancy, and continued CT follow-up is recommended.   Electronically Signed   By: Sabino Dick M.D.   On: 12/01/2013 10:41   Ct Cervical Spine Wo Contrast  11/30/2013   CLINICAL DATA:  [place] today in bathroom, struck back of head on toilet. No loss of consciousness.  EXAM: CT HEAD WITHOUT CONTRAST  CT CERVICAL SPINE WITHOUT CONTRAST  TECHNIQUE: Multidetector CT imaging of the head and cervical spine was performed following the standard protocol without intravenous contrast. Multiplanar CT image reconstructions of the cervical spine were also generated.  COMPARISON:  CT of the head January 26, 2012  FINDINGS: CT HEAD FINDINGS  No intraparenchymal hemorrhage or acute large vascular territory infarct. However, there are new predominately isodense masses within the supratentorial brain including 2.6 x 2.7 cm RIGHT basal ganglia, 1.8 x 1.9 cm RIGHT corpus callosum and 1.9 x 1.9 cm LEFT frontal convexity masses. Surrounding low-density vasogenic edema, including partial effacement of the lateral aspect of the frontal horn of the RIGHT lateral ventricle without hydrocephalus. No midline shift.  No abnormal extra-axial fluid collections. Moderate calcific atherosclerosis of the carotid siphons.  Small LEFT frontal/supraorbital scalp hematoma. No skull fracture. Mild paranasal sinus mucosal thickening without air-fluid levels. Ocular globes and orbital contents are unremarkable.Soft tissue within the external auditory canal likely reflects cerumen.  CT CERVICAL SPINE FINDINGS  Cervical vertebral bodies and posterior elements are intact and aligned and maintenance of cervical lordosis. C4-5 ACDF with solid interbody arthrodesis. Hardware appears intact and well seated without periprosthetic lucency. Mild C3-4 and C5-6 degenerative disc. C1-2 articulation maintained with arthropathy. Moderate calcific atherosclerosis of the carotid siphons.  IMPRESSION: CT  HEAD: Small LEFT frontal scalp  hematoma. No skull fracture. No acute intracranial process.  At least 3 supratentorial masses, with imaging characteristics concerning for metastasis, though these may reflect subacute hematomas, it would be atypical for all to the of identical chronicity. No midline shift. Recommend MRI of the brain with contrast.  CT CERVICAL SPINE: No acute fracture nor malalignment.  Status post C4-5 ACDF with solid fusion.  Acute findings discussed with and reconfirmed by Dr.FORREST HARRISON on 11/30/2013 at 10:23 pm.   Electronically Signed   By: Elon Alas   On: 11/30/2013 22:24   Mr Brain W Wo Contrast  12/01/2013   CLINICAL DATA:  Worsening lower extremity pain and edema with falls over the past 2 days. Left frontal head injury with 1 of the falls. Weight loss. IV drug abuse.  EXAM: MRI HEAD WITHOUT AND WITH CONTRAST  TECHNIQUE: Multiplanar, multiecho pulse sequences of the brain and surrounding structures were obtained without and with intravenous contrast.  CONTRAST:  65mL MULTIHANCE GADOBENATE DIMEGLUMINE 529 MG/ML IV SOLN  COMPARISON:  Head CT 11/30/2013 and MRI 10/21/2011  FINDINGS: There is no evidence of acute infarct or extra-axial fluid collection. Ring-enhancing masses demonstrate restricted diffusion and measured 2.4 x 2.1 cm in the posterior left frontal lobe, 2.3 x 2.1 cm in the right frontal centrum semiovale, and 2.7 x 2.6 cm at the level of the right basal ganglia. A small amount of susceptibility artifact within these lesions, most prominently in the right basal ganglia lesion, is compatible with a small amount of associated blood products.  There is mild to moderate vasogenic edema surrounding these lesions. Minimal leftward midline shift measures 3-4 mm. There is partial effacement of the frontal horn of the right lateral ventricle. There is no evidence of hydrocephalus. Chronic bilateral basal ganglia lacunar infarcts are again noted. Chronic cortical infarct  involving the left insula and left frontal operculum is again seen. There is mild generalized cerebral atrophy. Scattered, small foci of T2 hyperintensity in the cerebral white matter are nonspecific but may reflect mild chronic small vessel ischemic disease.  Orbits are unremarkable. Paranasal sinuses and mastoid air cells are clear. 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  IMPRESSION: Three ring-enhancing masses with mild to moderate surrounding edema, highly concerning for cerebral abscesses. Necrotic metastases are an additional but less favored consideration.  Critical Value/emergent results were called by telephone at the time of interpretation on 12/01/2013 at 10:14 am to Dr. Hulen Luster, who verbally acknowledged these results.   Electronically Signed   By: Logan Bores   On: 12/01/2013 10:18   Mr Cervical Spine W Wo Contrast  12/04/2013   CLINICAL DATA:  History of stroke, MRSA infection and intravenous drug use presents with fever and leg pain, multiple falls. Diagnosed with intracranial lesions on December 01, 2013 which may reflect abscess or metastatic disease.  EXAM: MRI TOTAL SPINE WITHOUT AND WITH CONTRAST  TECHNIQUE: Multisequence MR imaging of the spine from the cervical spine to the sacrum was performed prior to and following IV contrast administration for evaluation of spinal metastatic disease.  CONTRAST:  36mL MULTIHANCE GADOBENATE DIMEGLUMINE 529 MG/ML IV SOLN  COMPARISON:  CT of the chest, abdomen and pelvis December 09, 2013; MRI of the brain December 01, 2013  FINDINGS: Cervical Findings:  Cervical vertebral bodies and posterior elements appear intact and aligned with maintenance of cervical lordosis. Mild motion degraded examination. Status post C4-5 ACDF, which results in some susceptibility artifact. Mild to moderate C5-6 degenerative disc, with decreased T2 signal within  all cervical disc consistent with desiccation. Mild chronic discogenic endplate changes at  all cervical levels. No STIR signal abnormality to suggest acute osseous process. Limited post gadolinium sequence due to spinal hardware,  Cervical spinal cord appears normal morphology and signal characteristics from the cervical medullary junction to at least level of T3-4, most caudal well visualized level. Craniocervical junction maintained. 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 No convincing evidence of abnormal cord, leptomeningeal or epidural enhancement though, limited post gadolinium sagittal sequences and, due to patient's intolerance for further imaging, axial T1 post gadolinium sequence not obtained.  Level by level evaluation:  C2-3: No disc bulge, canal stenosis or neural foraminal narrowing.  C3-4: Uncovertebral hypertrophy and minimal annular bulging without canal stenosis or neural foraminal narrowing.  C4-5 ACDF.  C5-6: 3 mm RIGHT central broad-based disc protrusion. Uncovertebral hypertrophy. Moderate canal stenosis. Severe RIGHT, moderate to severe LEFT neural foraminal narrowing. There is likely a component adjacent segment disease.  C6-7: 2 mm broad-based disc bulge, mild canal stenosis. Minimal neural foraminal narrowing.  C7-T1: No disc bulge, canal stenosis nor neural foraminal narrowing.  Thoracic Findings:  Mild chronic T4 compression fracture. T2 superior endplate Schmorl's node. No acute fracture. No malalignment. Mild T6 70 T7-8 degenerative discs. Nose abnormal bone marrow signal to suggest acute osseous process. No suspicious osseous nor intradiscal enhancement.  Spinal cord appears normal morphology and signal characteristics with conus medullaris which is partially imaged at T12. No abnormal cord, leptomeningeal or upper dural enhancement. Included prevertebral and paraspinal soft tissues are nonsuspicious.  No significant disc bulge, canal stenosis or neural foraminal narrowing at any thoracic level. Moderate T8-9 facet arthropathy.  Lumbar  Findings:  Lumbar vertebral bodies and posterior elements are intact and aligned with maintenance of the lumbar lordosis. Moderate L5-S1 degenerative disc, mild desiccation L3-4 and L4-5. Mild subacute to chronic discogenic endplate changes N6-2 through L4-5. No STIR signal abnormality to suggest acute osseous process. No suspicious osseous nor intradiscal enhancement.  Conus medullaris terminates at T12-L1 and appears normal in morphology and signal characteristics. Cauda equina is unremarkable. No suspicious cord, leptomeningeal nor epidural enhancement. Included prevertebral and paraspinal soft tissues are nonsuspicious.  Level by level evaluation:  L1-2 and L2-3: No disc bulge, canal stenosis nor neural foraminal narrowing.  L3-4: 2 mm broad-based disc bulge, mild facet arthropathy and ligamentum flavum redundancy. Minimal canal stenosis. Mild bilateral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

## 2013-12-17 NOTE — Progress Notes (Signed)
Subjective: Seen and examined this AM.  Asking for pain medication and says he has to wait to long to receive it.  He says he is ready to go to SNF.  He reports improvement in pain at PEG site.  He feels his strength is improving.  Objective: Vital signs in last 24 hours: Filed Vitals:   12/16/13 2123 12/16/13 2136 12/17/13 0607 12/17/13 0609  BP:  155/77 138/88   Pulse:  77    Temp:  97.5 F (36.4 C) 97.8 F (36.6 C)   TempSrc:  Oral Oral   Resp:  16    Height:      Weight:    45.36 kg (100 lb)  SpO2: 97% 100% 95%    Weight change:   Intake/Output Summary (Last 24 hours) at 12/17/13 0757 Last data filed at 12/17/13 0610  Gross per 24 hour  Intake 967.33 ml  Output   2201 ml  Net -1233.67 ml   General: NAD, cachetic Lungs: CTAB Cardiac: RRR, S1/S2 GI: soft, scaphoid abdomen, minimally tender, PEG site without erythema, covered by dressing Ext: no edema Neuro:  AAO x3, responding appropriately, following commands, able to lift bilateral legs off bed, strength 3-4/5 equal B/L upper and lower extremities  Lab Results: Basic Metabolic Panel:  Recent Labs Lab 12/16/13 0920 12/17/13 0500  NA 133* 133*  K 4.9 4.7  CL 95* 97  CO2 26 25  GLUCOSE 148* 201*  BUN 35* 34*  CREATININE 0.58 0.61  CALCIUM 9.5 9.1    CBC:  Recent Labs Lab 12/16/13 0920 12/17/13 0500  WBC 11.2* 9.2  HGB 14.4 13.5  HCT 42.4 40.6  MCV 80.0 79.9  PLT 276 224   CBG:  Recent Labs Lab 12/16/13 1219 12/16/13 1722 12/16/13 2000 12/17/13 0005 12/17/13 0401 12/17/13 0745  GLUCAP 131* 160* 158* 197* 160* 198*    Studies/Results: No results found. Medications: I have reviewed the patient's current medications. Scheduled Meds: . antiseptic oral rinse  7 mL Mouth Rinse q12n4p  . cefTRIAXone (ROCEPHIN)  IV  2 g Intravenous Q12H  . chlorhexidine  15 mL Mouth Rinse BID  . dexamethasone  4 mg Intravenous 4 times per day  . famotidine  10 mg Oral BID  . fenofibrate  54 mg Oral  Daily  . fluconazole  100 mg Per Tube Daily  . levETIRAcetam  500 mg Intravenous Q12H  . metronidazole  500 mg Intravenous Q6H  . mirtazapine  7.5 mg Per Tube QHS  . mometasone-formoterol  2 puff Inhalation BID  . multivitamin with minerals  1 tablet Oral Daily  . neomycin-bacitracin-polymyxin   Topical BID  . nicotine  21 mg Transdermal Q24H  . pantoprazole (PROTONIX) IV  40 mg Intravenous QHS  . polyethylene glycol  17 g Oral BID   Continuous Infusions: . sodium chloride 20 mL/hr (12/15/13 1047)  . 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 Leukocytosis resolved.  Appreciate ID and Neurosurgery recommendations.  Neurosurg recommends tapering Decadron and advise that patient should follow up and get brain imaging as an outpatient.  Since it is Sunday, I will call and arrange imaging and Neurosurgery follow-up tomorrow.  He is stable for discharge to [facility] today. - begin tapering decadron at d/c:   12/06-12/11 4mg  three times  daily 12/12-12/17 4mg  twice daily 12/18-12/23 2mg  twice daily 12/24-12/29 1mg  twice daily 12/30-01/04 1mg  once daily - Keppra 500mg  Q12hr - the SNF called me and requested verbal order for liquid Keppra via PEG because they do not provide IV Keppra. - Continue Flagyl 500mg  q6hr and Rocephin 2g q12hr (end date 1/14) via PICC - He is scheduled for ID follow-up on 01/17/13.  I will schedule follow-up with neurosurgery and OPC in the next few weeks.  Will plan for MRI or CT brain in a few weeks to assess resolution (will coordinate timing with Neurosurgeon when I call to arrange his appointment).  Dysphagia: s/p IR G tube placement.   Appreciate nutrition recommendations. - continue TF - reviewed order over the phone with SNF administrator  Lung nodule: 3.6cm well defined nodule in left upper lobe that could represent metatstatic disease if pt has primary malignancy seen on chest CT on admisison. Likely due to scarring upon review w/ Dr. Beryle Beams - repeat Chest CT in 3-4 weeks - abx as noted above   Lower extremity pain and weakness: minimal disc bulging noted along with chronic T4 compression fracture.  Improving.   - Morphine 5mg  per tube Q4hr prn - will need continued rehab at SNF  Pharyngeal candidiasis: pt received one dose of amp B on 11/24 - continue diflucan 100mg  daily x 21 days (end date 12/18)  Polysubstance Abuse: - continue nicotine patch - continue Morphine 5mg  per tube Q4hr prn  COPD:  Stable  - continue bronchodilators  Anxiety:  - continue Klonopin 0.25mg  BID  - continue Remeron 7.5mg  QHS for sleep  DMII: 130s-190s, elevation likely due to Decadron.  Should see improvement as steroid is tapered.   - SSI-s and CBG checks Q6hr at SNF - discussed with SNF administrator  Essential Hypertension: normotensive.   FEN:  - TF  DVT Ppx:  - SCDs  Dispo: Stable for d/c to SNF today with close follow-up with neurosurgery, infectious disease and PCP.  The patient does have a current PCP (Kelby Aline, MD) and does need an [facility] hospital follow-up appointment after discharge.  The patient does not have transportation limitations that hinder transportation to clinic appointments.  .Services Needed at time of discharge: Y = Yes, Blank = No PT:   OT: SNF  RN:   Equipment:   Other:     LOS: 17 days   Francesca Oman, DO 12/17/2013, 7:57 AM

## 2013-12-18 ENCOUNTER — Other Ambulatory Visit: Payer: Self-pay | Admitting: *Deleted

## 2013-12-18 ENCOUNTER — Non-Acute Institutional Stay (SKILLED_NURSING_FACILITY): Payer: Medicaid Other | Admitting: Internal Medicine

## 2013-12-18 DIAGNOSIS — F4323 Adjustment disorder with mixed anxiety and depressed mood: Secondary | ICD-10-CM

## 2013-12-18 DIAGNOSIS — R29898 Other symptoms and signs involving the musculoskeletal system: Secondary | ICD-10-CM

## 2013-12-18 DIAGNOSIS — G060 Intracranial abscess and granuloma: Secondary | ICD-10-CM

## 2013-12-18 DIAGNOSIS — J441 Chronic obstructive pulmonary disease with (acute) exacerbation: Secondary | ICD-10-CM

## 2013-12-18 DIAGNOSIS — E860 Dehydration: Secondary | ICD-10-CM

## 2013-12-18 LAB — GLUCOSE, CAPILLARY: Glucose-Capillary: 181 mg/dL — ABNORMAL HIGH (ref 70–99)

## 2013-12-18 MED ORDER — CLONAZEPAM 0.5 MG PO TABS: ORAL_TABLET | ORAL | Status: DC

## 2013-12-18 NOTE — Telephone Encounter (Signed)
[facility] 

## 2013-12-19 LAB — GLUCOSE, CAPILLARY
GLUCOSE-CAPILLARY: 216 mg/dL — AB (ref 70–99)
GLUCOSE-CAPILLARY: 214 mg/dL — AB (ref 70–99)
Glucose-Capillary: 226 mg/dL — ABNORMAL HIGH (ref 70–99)
Glucose-Capillary: 227 mg/dL — ABNORMAL HIGH (ref 70–99)
Glucose-Capillary: 239 mg/dL — ABNORMAL HIGH (ref 70–99)

## 2013-12-19 NOTE — Progress Notes (Addendum)
Patient ID: [name], male   DOB: 09-24-1960, 53 y.o.   MRN: [id number]               HISTORY & PHYSICAL  DATE:  12/18/2013    FACILITY: [facility]     LEVEL OF CARE:   SNF   CHIEF COMPLAINT:  Admission to SNF, post stay at [facility], 11/30/2013 through 12/17/2013.    HISTORY OF PRESENT ILLNESS:  This is a 53 year-old man who has multiple medical issues and also a history of IV heroin and cocaine abuse.    He presented with worsening left lower extremity pain, edema, and falls over 2-3 days prior to admission.  He also had multiple falls and stated, "I can't control my feet and can't walk".  He has lost urinary continence.  He has no bowel incontinence.    On one of the falls, he hit his head.   I think this prompted an imaging of his brain which showed ring-enhancing lesions.  MRI confirmed three abscesses.  He was taken to the OR on 12/05/2013 for stereotactic-guided aspiration and biopsy.  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  AFB and fungal cultures were still pending but no acid-fast yeast or fungal elements have been identified to date.  Blood cultures were negative.  Antibiotics were narrowed to ceftriaxone and Flagyl.  It was felt that he was too frail for a TEE.  He is receiving eight weeks of antibiotics, to end on 01/25/2014.  He has a PICC line.  He is continuing a Decadron taper due to surrounding edema around the lesions.  He also has Keppra for seizure prophylaxis.  He has an ID follow-up on 01/17/2014.    PAST MEDICAL HISTORY/PROBLEM LIST:  Other medical issues include the following:    Dysphagia.  The patient failed his swallowing evaluation and initially received tube feedings via an NG.  However, he failed the second swallowing evaluation and the decision was made to have a PEG tube placed.  He is currently NPO.    Lung nodule.  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  The patient will need a follow-up CT scan of the chest in the next 3-4 weeks.    Lower extremity pain and weakness.  This was felt secondary to deconditioning and malnutrition.  Imaging revealed chronic T4 compression fracture.    Pharyngeal candidiasis.  He received one dose of Amphotericin B on 12/05/2013.  He was then started on Diflucan.    History of polysubstance abuse.  He has a history of IV heroin use and he was given morphine 5 mg per the tube q.4 p.r.n. and a nicotine patch.  Suggestions were to gradually taper the morphine or switch to methadone.    Depression with anxiety.  Psychiatry deemed him competent to make his own decisions.  He has Klonopin 0.25 b.i.d. for anxiety and Remeron 7.5.    Type 2 diabetes.  Managed with sliding scale insulin.  Felt to be secondary to Decadron.    CURRENT MEDICATIONS:  Medication list is reviewed.    Proventil nebulizers q.6 p.r.n., Proventil HFA 2 puffs q.6 p.r.n.    Rocephin 2 g every 12 hours.    Klonopin 0.25 b.i.d.     Combivent 1 puff every 6 hours p.r.n.    Decadron 4 mg via G-tube q.8.      Voltaren gel topically.  Tricor 54 mg via G-tube daily.    Advair Diskus 1 puff two times daily.    Insulin sliding scale.    Flagyl 500 mg IV q.6 hours, again until 01/25/2014.    Remeron 7.5 q.h.s.    Morphine 5 mg every 4 hours as needed for pain, 10 mg/85mL.      Nicotine patch 21 onto the skin daily.    Protonix 40 b.i.d.    SOCIAL HISTORY:       HOUSING:  The patient tells me he lives with his wife in [place].   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   FUNCTIONAL STATUS:  He denies any gait difficulties.  Did not use ambulatory assist device.   TOBACCO USE:  He smokes.   ILLICIT DRUGS:  Uses IV heroin and cocaine.    REVIEW OF SYSTEMS:   CHEST/RESPIRATORY:  Short of breath.   CARDIAC:   No chest pain.   GI:  He is  strictly NPO as he failed two major swallowing tests.   GU:  He has a condom catheter in place.    PHYSICAL EXAMINATION:   GENERAL APPEARANCE:  Very frail-looking man, gaunt and malnourished.  His speech is somewhat dysarthric, but comprehensible.  Seems to have a fairly good awareness of his medical history.   HEENT:   MOUTH/THROAT:   His tongue is dry.  Whether there are remnants of thrush here, I am uncertain.    CHEST/RESPIRATORY:  No clubbing.  He has hyperresonance to percussion.  Shallow air entry bilaterally.   CARDIOVASCULAR:  CARDIAC:   Heart sounds are normal.  He appears to be dehydrated.   GASTROINTESTINAL:  LIVER/SPLEEN/KIDNEYS:  No liver, no spleen.  No tenderness.   ABDOMEN:  No masses.   PEG site looks fine.   GENITOURINARY:  BLADDER:   Condom catheter.   SKIN:  INSPECTION:  Stage I pressure area on his buttocks areas.   NEUROLOGICAL:    SENSATION/STRENGTH:  He has profound lower extremity weakness with marked wasting, but no fasciculations.  He does not have antigravity strength, virtually nothing in hip abduction.  He cannot easily dorsi- or plantar flex.   DEEP TENDON REFLEXES:  His left plantar is upgoing, right is downgoing.  Left Hoffman's reflex.   BALANCE/GAIT:  I did not attempt to ambulate him.  I cannot imagine that he can mobilize now, although he states that two months ago he was ambulating normally.     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  He is on Rocephin and Flagyl IV, ending as noted above in the mid part of January.    Profound lower extremity weakness. t.   C-spine imaging showed mild C3-C4 and C5-C6 degenerative disease.  MRI of the C-spine:  Status post C4-C5 anterior cervical decompression, mild to moderate C5-C6 degenerative disc disease.  Cervical cord was normal morphology.  C5-C6 right broad-based disc protrusion.  He also had MRI of the T-spine which did not show any evidence of  metastatic disease, mild degenerative change.  MRI of the lumbar spine showed no metastatic disease, minimal canal stenosis at L3-L4 and L4-L5, moderate bilaterally at L4-L5.  This is likely due to combinations of neurologic pathology.   Left upper lobe nodule.  This was either felt to represent metastatic disease or scar.  He will need a follow-up CT scan of the chest which we will try to schedule with a follow-up MRI.    COPD.  I think this is clinically significant, but stable.    Gastroesophageal reflux disease.  On b.i.d. Protonix.    Thrush.  Treated aggressively in the hospital.    Clinical dehydration.  He will need the ordering of a free fluid flush.  I will check his lab work tomorrow.    Diabetes.  Probably secondary to steroids.    As mentioned I cannot imagine he was functional recently. CT of the chest and f/u MRI ordered.    CPT CODE: 34917                                  ADDENDUM:  I am going to order PT and OT on this man, remove his condom catheter and see if he can void without incontinence.   It is difficult to see him managing in any form of independent environment at this point.

## 2013-12-20 ENCOUNTER — Non-Acute Institutional Stay (SKILLED_NURSING_FACILITY): Payer: Medicaid Other | Admitting: Internal Medicine

## 2013-12-20 DIAGNOSIS — R29898 Other symptoms and signs involving the musculoskeletal system: Secondary | ICD-10-CM

## 2013-12-20 DIAGNOSIS — G060 Intracranial abscess and granuloma: Secondary | ICD-10-CM

## 2013-12-20 LAB — GLUCOSE, CAPILLARY
GLUCOSE-CAPILLARY: 124 mg/dL — AB (ref 70–99)
GLUCOSE-CAPILLARY: 189 mg/dL — AB (ref 70–99)
GLUCOSE-CAPILLARY: 254 mg/dL — AB (ref 70–99)
Glucose-Capillary: 152 mg/dL — ABNORMAL HIGH (ref 70–99)
Glucose-Capillary: 242 mg/dL — ABNORMAL HIGH (ref 70–99)
Glucose-Capillary: 233 mg/dL — ABNORMAL HIGH (ref 70–99)

## 2013-12-21 ENCOUNTER — Other Ambulatory Visit: Payer: Self-pay | Admitting: *Deleted

## 2013-12-21 LAB — GLUCOSE, CAPILLARY
Glucose-Capillary: 120 mg/dL — ABNORMAL HIGH (ref 70–99)
Glucose-Capillary: 298 mg/dL — ABNORMAL HIGH (ref 70–99)
Glucose-Capillary: 320 mg/dL — ABNORMAL HIGH (ref 70–99)

## 2013-12-21 MED ORDER — AMBULATORY NON FORMULARY MEDICATION: Status: DC

## 2013-12-21 NOTE — Telephone Encounter (Signed)
[facility] 

## 2013-12-22 LAB — GLUCOSE, CAPILLARY
Glucose-Capillary: 146 mg/dL — ABNORMAL HIGH (ref 70–99)
Glucose-Capillary: 151 mg/dL — ABNORMAL HIGH (ref 70–99)
Glucose-Capillary: 368 mg/dL — ABNORMAL HIGH (ref 70–99)
Glucose-Capillary: 190 mg/dL — ABNORMAL HIGH (ref 70–99)
Glucose-Capillary: 245 mg/dL — ABNORMAL HIGH (ref 70–99)

## 2013-12-23 LAB — GLUCOSE, CAPILLARY
GLUCOSE-CAPILLARY: 296 mg/dL — AB (ref 70–99)
Glucose-Capillary: 124 mg/dL — ABNORMAL HIGH (ref 70–99)

## 2013-12-24 LAB — GLUCOSE, CAPILLARY
GLUCOSE-CAPILLARY: 116 mg/dL — AB (ref 70–99)
GLUCOSE-CAPILLARY: 282 mg/dL — AB (ref 70–99)
GLUCOSE-CAPILLARY: 82 mg/dL (ref 70–99)
Glucose-Capillary: 275 mg/dL — ABNORMAL HIGH (ref 70–99)
Glucose-Capillary: 200 mg/dL — ABNORMAL HIGH (ref 70–99)

## 2013-12-24 NOTE — Progress Notes (Signed)
Patient ID: QAIS JOWERS, male   DOB: [birth year], 53 y.o.   MRN: [id number]               PROGRESS NOTE  DATE:  12/20/2013    FACILITY: [facility]    LEVEL OF CARE:   SNF   Acute Visit   CHIEF COMPLAINT:  Review of lower extremity weakness, functional status.    HISTORY OF PRESENT ILLNESS:  This is a gentleman who came to [place] from [facility].  Among other things, he was discovered to have three ring-enhancing masses with mild to moderate surrounding edema.  These were in the posterior left frontal lobe and the right frontal centrum semiovale and at the level of the right basal ganglia.  Aspiration of these showed microaerophilic strep.  Among other things, he is here for completion of Rocephin and Flagyl as directed by Infectious Disease until 01/27/2014.    Notable on his admission history and physical was profound lower extremity weakness without evidence of any sensory loss.  He was extensively evaluated in the hospital for what was felt to be metastatic deposits in his C- and L-spine.  Although he does have degenerative disc disease, there is certainly nothing to explain this at either level.     When I saw him on 12/18/2013, he did not have antigravity strength in the hip flexors, nor hip abductors.  He had some dorsiflexion.  Exam was notable for preservation of his knee jerks, ankle jerks, and upgoing toe on the left but downgoing toe on the right.  He had very significant muscle wasting in both legs, but without fasciculations.    The patient tells me that he was very functional up until just before this admission.  He was working and his legs gave out on him, and that was just before he came to the hospital.  His son, who is present today, verifies this history but also states that he lost 100 pounds in the last six months.    PHYSICAL EXAMINATION:   MUSCULOSKELETAL:   EXTREMITIES:   BILATERAL UPPER EXTREMITIES:  In the arms, he has generalized muscle wasting, mild  left pronator drift.      NEUROLOGICAL:    CRANIAL NERVES:  His extraocular movements are normal.  Cranial nerve #7 seems intact.  His jaw deviates to the left.  He has a brisk jaw jerk.  There are no fasciculations in his tongue, and cranial nerve #12 is normal.   SENSATION/STRENGTH:   In the lower extremities, he appears to have recovered some strength in the hip flexors bilaterally, although this is antigravity and still very weak, 3/5.  He has minimal hip abductor, minimal knee flexor, and still minimal plantar flexion.   DEEP TENDON REFLEXES:  Reflexes symmetrically brisk.  No Hoffman's reflex.  His reflexes are present at the knee jerks and ankle jerks with spread up into the thighs.  His left Babinski is extensor, right is flexor.   LYMPHATICS:  None palpable in the cervical area.   NECK/THYROID:  There is no thyroid palpable.    ASSESSMENT/PLAN:                                  Right-sided predominantly intracerebral abscesses.  I am not sure this explains all of the findings in his legs.  I wonder if he has steroid myopathy, although this seems fairly quick, as well.  I outlined  a taper of his Decadron the other day.  He does not seem to have been critically ill enough to have a critical illness and neuropathy or myopathy, although that is still a thought.  He seems better than two days ago so, therefore, now I am going to follow this clinically.  If he regresses or plateaus, a Neurology consult may be in order.    The patient continues with pain issues.  He is not opiate-naive.    Dysphagia.  I am hoping that this is secondary to the abscesses and will improve.  Hopefully, treatment has been initiated for this.    Finally, he is on Diflucan 100 mg a day for reasons that are not really clear.  I am going to need to research this.      CPT CODE: 73220                                       ADDENDUM:  The nursing staff just reported that the patient is having liquid stools, x3 today.   Certainly at risk for C.diff.  We will check a specimen.

## 2013-12-25 LAB — GLUCOSE, CAPILLARY
GLUCOSE-CAPILLARY: 228 mg/dL — AB (ref 70–99)
GLUCOSE-CAPILLARY: 299 mg/dL — AB (ref 70–99)
GLUCOSE-CAPILLARY: 85 mg/dL (ref 70–99)
Glucose-Capillary: 78 mg/dL (ref 70–99)
Glucose-Capillary: 222 mg/dL — ABNORMAL HIGH (ref 70–99)

## 2013-12-26 LAB — GLUCOSE, CAPILLARY
GLUCOSE-CAPILLARY: 133 mg/dL — AB (ref 70–99)
Glucose-Capillary: 136 mg/dL — ABNORMAL HIGH (ref 70–99)
Glucose-Capillary: 255 mg/dL — ABNORMAL HIGH (ref 70–99)
Glucose-Capillary: 305 mg/dL — ABNORMAL HIGH (ref 70–99)

## 2013-12-27 LAB — GLUCOSE, CAPILLARY
GLUCOSE-CAPILLARY: 176 mg/dL — AB (ref 70–99)
Glucose-Capillary: 136 mg/dL — ABNORMAL HIGH (ref 70–99)
Glucose-Capillary: 283 mg/dL — ABNORMAL HIGH (ref 70–99)
Glucose-Capillary: 473 mg/dL — ABNORMAL HIGH (ref 70–99)
Glucose-Capillary: 350 mg/dL — ABNORMAL HIGH (ref 70–99)

## 2013-12-28 LAB — GLUCOSE, CAPILLARY
Glucose-Capillary: 316 mg/dL — ABNORMAL HIGH (ref 70–99)
Glucose-Capillary: 163 mg/dL — ABNORMAL HIGH (ref 70–99)
Glucose-Capillary: 146 mg/dL — ABNORMAL HIGH (ref 70–99)
Glucose-Capillary: 436 mg/dL — ABNORMAL HIGH (ref 70–99)
Glucose-Capillary: 263 mg/dL — ABNORMAL HIGH (ref 70–99)

## 2013-12-29 LAB — GLUCOSE, CAPILLARY
GLUCOSE-CAPILLARY: 110 mg/dL — AB (ref 70–99)
GLUCOSE-CAPILLARY: 252 mg/dL — AB (ref 70–99)
GLUCOSE-CAPILLARY: 373 mg/dL — AB (ref 70–99)

## 2013-12-30 LAB — GLUCOSE, CAPILLARY
GLUCOSE-CAPILLARY: 293 mg/dL — AB (ref 70–99)
GLUCOSE-CAPILLARY: 177 mg/dL — AB (ref 70–99)
Glucose-Capillary: 259 mg/dL — ABNORMAL HIGH (ref 70–99)
Glucose-Capillary: 257 mg/dL — ABNORMAL HIGH (ref 70–99)
Glucose-Capillary: 241 mg/dL — ABNORMAL HIGH (ref 70–99)

## 2013-12-31 LAB — GLUCOSE, CAPILLARY
GLUCOSE-CAPILLARY: 258 mg/dL — AB (ref 70–99)
Glucose-Capillary: 251 mg/dL — ABNORMAL HIGH (ref 70–99)
Glucose-Capillary: 199 mg/dL — ABNORMAL HIGH (ref 70–99)
Glucose-Capillary: 247 mg/dL — ABNORMAL HIGH (ref 70–99)

## 2014-01-01 ENCOUNTER — Encounter (HOSPITAL_COMMUNITY): Payer: Self-pay | Admitting: Emergency Medicine

## 2014-01-01 ENCOUNTER — Emergency Department (HOSPITAL_COMMUNITY): Payer: Medicaid Other

## 2014-01-01 ENCOUNTER — Inpatient Hospital Stay (HOSPITAL_COMMUNITY)
Admission: EM | Admit: 2014-01-01 | Discharge: 2014-01-03 | DRG: 175 | Disposition: A | Discharge status: Skilled Nursing Facility | Payer: Medicaid Other | Attending: Internal Medicine | Admitting: Internal Medicine

## 2014-01-01 DIAGNOSIS — M79672 Pain in left foot: Secondary | ICD-10-CM

## 2014-01-01 DIAGNOSIS — G4730 Sleep apnea, unspecified: Secondary | ICD-10-CM | POA: Diagnosis present

## 2014-01-01 DIAGNOSIS — Z9119 Patient's noncompliance with other medical treatment and regimen: Secondary | ICD-10-CM | POA: Diagnosis present

## 2014-01-01 DIAGNOSIS — Z0000 Encounter for general adult medical examination without abnormal findings: Secondary | ICD-10-CM

## 2014-01-01 DIAGNOSIS — G8929 Other chronic pain: Secondary | ICD-10-CM

## 2014-01-01 DIAGNOSIS — J449 Chronic obstructive pulmonary disease, unspecified: Secondary | ICD-10-CM | POA: Diagnosis present

## 2014-01-01 DIAGNOSIS — F1110 Opioid abuse, uncomplicated: Secondary | ICD-10-CM

## 2014-01-01 DIAGNOSIS — R42 Dizziness and giddiness: Secondary | ICD-10-CM

## 2014-01-01 DIAGNOSIS — I739 Peripheral vascular disease, unspecified: Secondary | ICD-10-CM

## 2014-01-01 DIAGNOSIS — R0600 Dyspnea, unspecified: Secondary | ICD-10-CM

## 2014-01-01 DIAGNOSIS — F32A Depression, unspecified: Secondary | ICD-10-CM

## 2014-01-01 DIAGNOSIS — M25552 Pain in left hip: Secondary | ICD-10-CM

## 2014-01-01 DIAGNOSIS — R1319 Other dysphagia: Secondary | ICD-10-CM

## 2014-01-01 DIAGNOSIS — R269 Unspecified abnormalities of gait and mobility: Secondary | ICD-10-CM

## 2014-01-01 DIAGNOSIS — F411 Generalized anxiety disorder: Secondary | ICD-10-CM

## 2014-01-01 DIAGNOSIS — Z8673 Personal history of transient ischemic attack (TIA), and cerebral infarction without residual deficits: Secondary | ICD-10-CM

## 2014-01-01 DIAGNOSIS — I6523 Occlusion and stenosis of bilateral carotid arteries: Secondary | ICD-10-CM

## 2014-01-01 DIAGNOSIS — F528 Other sexual dysfunction not due to a substance or known physiological condition: Secondary | ICD-10-CM

## 2014-01-01 DIAGNOSIS — R7989 Other specified abnormal findings of blood chemistry: Secondary | ICD-10-CM

## 2014-01-01 DIAGNOSIS — F1011 Alcohol abuse, in remission: Secondary | ICD-10-CM

## 2014-01-01 DIAGNOSIS — C801 Malignant (primary) neoplasm, unspecified: Secondary | ICD-10-CM

## 2014-01-01 DIAGNOSIS — R1310 Dysphagia, unspecified: Secondary | ICD-10-CM

## 2014-01-01 DIAGNOSIS — G060 Intracranial abscess and granuloma: Secondary | ICD-10-CM | POA: Diagnosis present

## 2014-01-01 DIAGNOSIS — I10 Essential (primary) hypertension: Secondary | ICD-10-CM | POA: Diagnosis present

## 2014-01-01 DIAGNOSIS — F329 Major depressive disorder, single episode, unspecified: Secondary | ICD-10-CM

## 2014-01-01 DIAGNOSIS — E46 Unspecified protein-calorie malnutrition: Secondary | ICD-10-CM

## 2014-01-01 DIAGNOSIS — F17210 Nicotine dependence, cigarettes, uncomplicated: Secondary | ICD-10-CM | POA: Diagnosis present

## 2014-01-01 DIAGNOSIS — R110 Nausea: Secondary | ICD-10-CM

## 2014-01-01 DIAGNOSIS — R079 Chest pain, unspecified: Secondary | ICD-10-CM | POA: Insufficient documentation

## 2014-01-01 DIAGNOSIS — R634 Abnormal weight loss: Secondary | ICD-10-CM

## 2014-01-01 DIAGNOSIS — E785 Hyperlipidemia, unspecified: Secondary | ICD-10-CM

## 2014-01-01 DIAGNOSIS — Z0189 Encounter for other specified special examinations: Secondary | ICD-10-CM

## 2014-01-01 DIAGNOSIS — R748 Abnormal levels of other serum enzymes: Secondary | ICD-10-CM | POA: Diagnosis present

## 2014-01-01 DIAGNOSIS — R55 Syncope and collapse: Secondary | ICD-10-CM

## 2014-01-01 DIAGNOSIS — W57XXXA Bitten or stung by nonvenomous insect and other nonvenomous arthropods, initial encounter: Secondary | ICD-10-CM

## 2014-01-01 DIAGNOSIS — Z7901 Long term (current) use of anticoagulants: Secondary | ICD-10-CM

## 2014-01-01 DIAGNOSIS — Z7189 Other specified counseling: Secondary | ICD-10-CM

## 2014-01-01 DIAGNOSIS — Z720 Tobacco use: Secondary | ICD-10-CM

## 2014-01-01 DIAGNOSIS — K219 Gastro-esophageal reflux disease without esophagitis: Secondary | ICD-10-CM | POA: Diagnosis present

## 2014-01-01 DIAGNOSIS — R531 Weakness: Secondary | ICD-10-CM

## 2014-01-01 DIAGNOSIS — Z515 Encounter for palliative care: Secondary | ICD-10-CM

## 2014-01-01 DIAGNOSIS — R262 Difficulty in walking, not elsewhere classified: Secondary | ICD-10-CM

## 2014-01-01 DIAGNOSIS — A630 Anogenital (venereal) warts: Secondary | ICD-10-CM

## 2014-01-01 DIAGNOSIS — E43 Unspecified severe protein-calorie malnutrition: Secondary | ICD-10-CM | POA: Diagnosis present

## 2014-01-01 DIAGNOSIS — I2699 Other pulmonary embolism without acute cor pulmonale: Secondary | ICD-10-CM | POA: Diagnosis present

## 2014-01-01 DIAGNOSIS — M5126 Other intervertebral disc displacement, lumbar region: Secondary | ICD-10-CM

## 2014-01-01 DIAGNOSIS — G894 Chronic pain syndrome: Secondary | ICD-10-CM | POA: Diagnosis present

## 2014-01-01 DIAGNOSIS — L03119 Cellulitis of unspecified part of limb: Secondary | ICD-10-CM

## 2014-01-01 DIAGNOSIS — Z86711 Personal history of pulmonary embolism: Secondary | ICD-10-CM | POA: Diagnosis present

## 2014-01-01 DIAGNOSIS — B1920 Unspecified viral hepatitis C without hepatic coma: Secondary | ICD-10-CM | POA: Diagnosis present

## 2014-01-01 DIAGNOSIS — E119 Type 2 diabetes mellitus without complications: Secondary | ICD-10-CM

## 2014-01-01 LAB — GLUCOSE, CAPILLARY
GLUCOSE-CAPILLARY: 149 mg/dL — AB (ref 70–99)
Glucose-Capillary: 189 mg/dL — ABNORMAL HIGH (ref 70–99)
Glucose-Capillary: 320 mg/dL — ABNORMAL HIGH (ref 70–99)

## 2014-01-01 NOTE — ED Notes (Signed)
Patient complaining of chest pain that started today. Patient is living at [facility]. Patient recently started eating food again after receiving meals only via G-tube. Patient consumed lots of food today with high sugar content.

## 2014-01-02 ENCOUNTER — Emergency Department (HOSPITAL_COMMUNITY): Payer: Medicaid Other

## 2014-01-02 ENCOUNTER — Inpatient Hospital Stay (HOSPITAL_COMMUNITY): Payer: Medicaid Other

## 2014-01-02 DIAGNOSIS — I10 Essential (primary) hypertension: Secondary | ICD-10-CM | POA: Diagnosis present

## 2014-01-02 DIAGNOSIS — Z8673 Personal history of transient ischemic attack (TIA), and cerebral infarction without residual deficits: Secondary | ICD-10-CM | POA: Diagnosis not present

## 2014-01-02 DIAGNOSIS — I2699 Other pulmonary embolism without acute cor pulmonale: Principal | ICD-10-CM

## 2014-01-02 DIAGNOSIS — F17210 Nicotine dependence, cigarettes, uncomplicated: Secondary | ICD-10-CM | POA: Diagnosis present

## 2014-01-02 DIAGNOSIS — R079 Chest pain, unspecified: Secondary | ICD-10-CM | POA: Insufficient documentation

## 2014-01-02 DIAGNOSIS — Z86711 Personal history of pulmonary embolism: Secondary | ICD-10-CM | POA: Diagnosis present

## 2014-01-02 DIAGNOSIS — R748 Abnormal levels of other serum enzymes: Secondary | ICD-10-CM | POA: Diagnosis present

## 2014-01-02 DIAGNOSIS — G4730 Sleep apnea, unspecified: Secondary | ICD-10-CM | POA: Diagnosis present

## 2014-01-02 DIAGNOSIS — E785 Hyperlipidemia, unspecified: Secondary | ICD-10-CM | POA: Diagnosis present

## 2014-01-02 DIAGNOSIS — J449 Chronic obstructive pulmonary disease, unspecified: Secondary | ICD-10-CM | POA: Diagnosis present

## 2014-01-02 DIAGNOSIS — Z7901 Long term (current) use of anticoagulants: Secondary | ICD-10-CM | POA: Diagnosis not present

## 2014-01-02 DIAGNOSIS — E119 Type 2 diabetes mellitus without complications: Secondary | ICD-10-CM | POA: Diagnosis present

## 2014-01-02 DIAGNOSIS — G060 Intracranial abscess and granuloma: Secondary | ICD-10-CM

## 2014-01-02 DIAGNOSIS — K219 Gastro-esophageal reflux disease without esophagitis: Secondary | ICD-10-CM | POA: Diagnosis present

## 2014-01-02 DIAGNOSIS — E43 Unspecified severe protein-calorie malnutrition: Secondary | ICD-10-CM | POA: Diagnosis present

## 2014-01-02 DIAGNOSIS — Z9119 Patient's noncompliance with other medical treatment and regimen: Secondary | ICD-10-CM | POA: Diagnosis present

## 2014-01-02 DIAGNOSIS — I739 Peripheral vascular disease, unspecified: Secondary | ICD-10-CM | POA: Diagnosis present

## 2014-01-02 DIAGNOSIS — B1920 Unspecified viral hepatitis C without hepatic coma: Secondary | ICD-10-CM | POA: Diagnosis present

## 2014-01-02 DIAGNOSIS — G894 Chronic pain syndrome: Secondary | ICD-10-CM | POA: Diagnosis present

## 2014-01-02 LAB — GLUCOSE, CAPILLARY
GLUCOSE-CAPILLARY: 238 mg/dL — AB (ref 70–99)
Glucose-Capillary: 346 mg/dL — ABNORMAL HIGH (ref 70–99)
Glucose-Capillary: 234 mg/dL — ABNORMAL HIGH (ref 70–99)
Glucose-Capillary: 163 mg/dL — ABNORMAL HIGH (ref 70–99)
Glucose-Capillary: 329 mg/dL — ABNORMAL HIGH (ref 70–99)

## 2014-01-02 LAB — CBC
HCT: 33.7 % — ABNORMAL LOW (ref 39.0–52.0)
Hemoglobin: 11.2 g/dL — ABNORMAL LOW (ref 13.0–17.0)
MCH: 28.4 pg (ref 26.0–34.0)
MCHC: 33.2 g/dL (ref 30.0–36.0)
MCV: 85.5 fL (ref 78.0–100.0)
Platelets: 112 10*3/uL — ABNORMAL LOW (ref 150–400)
RBC: 3.94 MIL/uL — ABNORMAL LOW (ref 4.22–5.81)
RDW: 20 % — ABNORMAL HIGH (ref 11.5–15.5)
WBC: 4.5 10*3/uL (ref 4.0–10.5)

## 2014-01-02 LAB — CBC WITH DIFFERENTIAL/PLATELET
BASOS ABS: 0 10*3/uL (ref 0.0–0.1)
Basophils Relative: 0 % (ref 0–1)
EOS ABS: 0.1 10*3/uL (ref 0.0–0.7)
EOS PCT: 3 % (ref 0–5)
HEMATOCRIT: 33.4 % — AB (ref 39.0–52.0)
HEMOGLOBIN: 10.8 g/dL — AB (ref 13.0–17.0)
Lymphocytes Relative: 21 % (ref 12–46)
Lymphs Abs: 1.1 10*3/uL (ref 0.7–4.0)
MCH: 27.6 pg (ref 26.0–34.0)
MCHC: 32.3 g/dL (ref 30.0–36.0)
MCV: 85.4 fL (ref 78.0–100.0)
MONOS PCT: 5 % (ref 3–12)
Monocytes Absolute: 0.3 10*3/uL (ref 0.1–1.0)
Neutro Abs: 3.9 10*3/uL (ref 1.7–7.7)
Neutrophils Relative %: 71 % (ref 43–77)
Platelets: 115 10*3/uL — ABNORMAL LOW (ref 150–400)
RBC: 3.91 MIL/uL — ABNORMAL LOW (ref 4.22–5.81)
RDW: 19.6 % — AB (ref 11.5–15.5)
WBC: 5.5 10*3/uL (ref 4.0–10.5)

## 2014-01-02 LAB — HEPARIN LEVEL (UNFRACTIONATED)
HEPARIN UNFRACTIONATED: 0.26 [IU]/mL — AB (ref 0.30–0.70)
Heparin Unfractionated: 0.14 IU/mL — ABNORMAL LOW (ref 0.30–0.70)

## 2014-01-02 LAB — COMPREHENSIVE METABOLIC PANEL
ALT: 61 U/L — ABNORMAL HIGH (ref 0–53)
ANION GAP: 10 (ref 5–15)
AST: 40 U/L — ABNORMAL HIGH (ref 0–37)
Albumin: 2.8 g/dL — ABNORMAL LOW (ref 3.5–5.2)
Alkaline Phosphatase: 55 U/L (ref 39–117)
BUN: 29 mg/dL — AB (ref 6–23)
CALCIUM: 8.3 mg/dL — AB (ref 8.4–10.5)
CO2: 27 mmol/L (ref 19–32)
Chloride: 102 mEq/L (ref 96–112)
Creatinine, Ser: 0.88 mg/dL (ref 0.50–1.35)
GLUCOSE: 240 mg/dL — AB (ref 70–99)
Potassium: 4.4 mEq/L (ref 3.7–5.3)
Sodium: 139 mEq/L (ref 137–147)
Total Bilirubin: 0.2 mg/dL — ABNORMAL LOW (ref 0.3–1.2)
Total Protein: 5.6 g/dL — ABNORMAL LOW (ref 6.0–8.3)

## 2014-01-02 LAB — HEMOGLOBIN A1C
HEMOGLOBIN A1C: 7 % — AB (ref <5.7)
Mean Plasma Glucose: 154 mg/dL — ABNORMAL HIGH (ref <117)

## 2014-01-02 LAB — CBG MONITORING, ED: GLUCOSE-CAPILLARY: 237 mg/dL — AB (ref 70–99)

## 2014-01-02 LAB — TROPONIN I
TROPONIN I: 0.33 ng/mL — AB (ref <0.031)
TROPONIN I: 0.31 ng/mL — AB (ref <0.031)
Troponin I: <0.29 ng/mL (ref <0.30)

## 2014-01-02 LAB — FUNGUS CULTURE W SMEAR
FUNGAL SMEAR: NONE SEEN
Fungal Smear: NONE SEEN
Fungal Smear: NONE SEEN

## 2014-01-02 LAB — PROTIME-INR
INR: 0.89 (ref 0.00–1.49)
Prothrombin Time: 12.1 seconds (ref 11.6–15.2)

## 2014-01-02 LAB — APTT: aPTT: 27 seconds (ref 24–37)

## 2014-01-02 MED ORDER — ONDANSETRON HCL 4 MG/2ML IJ SOLN: 4.0000 mg | Freq: Four times a day (QID) | INTRAMUSCULAR | Status: DC | PRN

## 2014-01-02 MED ORDER — ASPIRIN 81 MG PO CHEW
324.0000 mg | CHEWABLE_TABLET | Freq: Once | ORAL | Status: AC
  Administered 2014-01-02: 324 mg via ORAL
  Filled 2014-01-02: qty 4

## 2014-01-02 MED ORDER — IOHEXOL 350 MG/ML SOLN
100.0000 mL | Freq: Once | INTRAVENOUS | Status: AC | PRN
  Administered 2014-01-02: 100 mL via INTRAVENOUS

## 2014-01-02 MED ORDER — ALBUTEROL SULFATE (2.5 MG/3ML) 0.083% IN NEBU: 2.5000 mg | INHALATION_SOLUTION | Freq: Four times a day (QID) | RESPIRATORY_TRACT | Status: DC | PRN

## 2014-01-02 MED ORDER — PATIENT'S GUIDE TO USING COUMADIN BOOK
Freq: Once | Status: AC
  Administered 2014-01-02: 13:00:00
  Filled 2014-01-02: qty 1

## 2014-01-02 MED ORDER — ADULT MULTIVITAMIN W/MINERALS CH
1.0000 | ORAL_TABLET | Freq: Every day | ORAL | Status: DC
  Administered 2014-01-02 – 2014-01-03 (×2): 1 via ORAL
  Filled 2014-01-02 (×2): qty 1

## 2014-01-02 MED ORDER — WARFARIN - PHARMACIST DOSING INPATIENT
Status: DC
Start: 2014-01-02 — End: 2014-01-03
  Administered 2014-01-02: 16:00:00

## 2014-01-02 MED ORDER — HEPARIN (PORCINE) IN NACL 100-0.45 UNIT/ML-% IJ SOLN
850.0000 [IU]/h | INTRAMUSCULAR | Status: DC
Start: 2014-01-02 — End: 2014-01-02
  Administered 2014-01-02: 850 [IU]/h via INTRAVENOUS
  Filled 2014-01-02: qty 250

## 2014-01-02 MED ORDER — SODIUM CHLORIDE 0.9 % IJ SOLN
3.0000 mL | Freq: Two times a day (BID) | INTRAMUSCULAR | Status: DC
  Administered 2014-01-02 – 2014-01-03 (×3): 3 mL via INTRAVENOUS

## 2014-01-02 MED ORDER — JEVITY 1.2 CAL PO LIQD
1000.0000 mL | ORAL | Status: DC
Start: 2014-01-02 — End: 2014-01-03
  Filled 2014-01-02 (×11): qty 1000

## 2014-01-02 MED ORDER — NICOTINE 21 MG/24HR TD PT24: 21.0000 mg | MEDICATED_PATCH | Freq: Once | TRANSDERMAL | Status: DC

## 2014-01-02 MED ORDER — HEPARIN BOLUS VIA INFUSION
2500.0000 [IU] | Freq: Once | INTRAVENOUS | Status: AC
  Administered 2014-01-02: 2500 [IU] via INTRAVENOUS

## 2014-01-02 MED ORDER — HEPARIN BOLUS VIA INFUSION
1000.0000 [IU] | Freq: Once | INTRAVENOUS | Status: AC
  Administered 2014-01-02: 1000 [IU] via INTRAVENOUS
  Filled 2014-01-02: qty 1000

## 2014-01-02 MED ORDER — METRONIDAZOLE IN NACL 5-0.79 MG/ML-% IV SOLN
500.0000 mg | Freq: Four times a day (QID) | INTRAVENOUS | Status: DC
  Administered 2014-01-02 – 2014-01-03 (×5): 500 mg via INTRAVENOUS
  Filled 2014-01-02 (×5): qty 100

## 2014-01-02 MED ORDER — SODIUM CHLORIDE 0.9 % IJ SOLN: 3.0000 mL | INTRAMUSCULAR | Status: DC | PRN

## 2014-01-02 MED ORDER — WARFARIN VIDEO
Freq: Once | Status: AC
  Administered 2014-01-02: 13:00:00

## 2014-01-02 MED ORDER — IPRATROPIUM-ALBUTEROL 0.5-2.5 (3) MG/3ML IN SOLN: 3.0000 mL | Freq: Four times a day (QID) | RESPIRATORY_TRACT | Status: DC | PRN

## 2014-01-02 MED ORDER — MORPHINE SULFATE (CONCENTRATE) 10 MG/0.5ML PO SOLN
5.0000 mg | ORAL | Status: DC | PRN
  Administered 2014-01-02 – 2014-01-03 (×7): 5 mg
  Filled 2014-01-02 (×7): qty 0.5

## 2014-01-02 MED ORDER — MIRTAZAPINE 15 MG PO TABS
7.5000 mg | ORAL_TABLET | Freq: Every day | ORAL | Status: DC
  Administered 2014-01-02: 7.5 mg
  Filled 2014-01-02 (×4): qty 0.5
  Filled 2014-01-02: qty 1
  Filled 2014-01-02: qty 0.5

## 2014-01-02 MED ORDER — INSULIN ASPART 100 UNIT/ML ~~LOC~~ SOLN
0.0000 [IU] | Freq: Three times a day (TID) | SUBCUTANEOUS | Status: DC
  Administered 2014-01-02: 5 [IU] via SUBCUTANEOUS

## 2014-01-02 MED ORDER — INSULIN ASPART 100 UNIT/ML ~~LOC~~ SOLN
0.0000 [IU] | Freq: Every day | SUBCUTANEOUS | Status: DC
  Administered 2014-01-02: 2 [IU] via SUBCUTANEOUS

## 2014-01-02 MED ORDER — ONDANSETRON HCL 4 MG PO TABS
4.0000 mg | ORAL_TABLET | Freq: Four times a day (QID) | ORAL | Status: DC | PRN
Start: 2014-01-02 — End: 2014-01-03

## 2014-01-02 MED ORDER — ACETAMINOPHEN 650 MG RE SUPP: 650.0000 mg | Freq: Four times a day (QID) | RECTAL | Status: DC | PRN

## 2014-01-02 MED ORDER — DEXTROSE 5 % IV SOLN
INTRAVENOUS | Status: AC
  Filled 2014-01-02: qty 2

## 2014-01-02 MED ORDER — GI COCKTAIL ~~LOC~~
30.0000 mL | Freq: Once | ORAL | Status: AC
  Administered 2014-01-02: 30 mL via ORAL
  Filled 2014-01-02: qty 30

## 2014-01-02 MED ORDER — WARFARIN SODIUM 5 MG PO TABS
5.0000 mg | ORAL_TABLET | Freq: Once | ORAL | Status: AC
  Administered 2014-01-02: 5 mg via ORAL
  Filled 2014-01-02: qty 1

## 2014-01-02 MED ORDER — CEFTRIAXONE SODIUM IN DEXTROSE 40 MG/ML IV SOLN
2.0000 g | Freq: Two times a day (BID) | INTRAVENOUS | Status: DC
  Administered 2014-01-02 – 2014-01-03 (×3): 2 g via INTRAVENOUS
  Filled 2014-01-02 (×13): qty 50

## 2014-01-02 MED ORDER — MORPHINE SULFATE (CONCENTRATE) 10 MG/0.5ML PO SOLN
5.0000 mg | ORAL | Status: DC | PRN
  Administered 2014-01-02 (×2): 5 mg
  Filled 2014-01-02 (×2): qty 0.5

## 2014-01-02 MED ORDER — ALBUTEROL SULFATE HFA 108 (90 BASE) MCG/ACT IN AERS
2.0000 | INHALATION_SPRAY | Freq: Four times a day (QID) | RESPIRATORY_TRACT | Status: DC | PRN
  Filled 2014-01-02: qty 6.7

## 2014-01-02 MED ORDER — MORPHINE SULFATE 4 MG/ML IJ SOLN
4.0000 mg | Freq: Once | INTRAMUSCULAR | Status: AC
  Administered 2014-01-02: 4 mg via INTRAVENOUS
  Filled 2014-01-02: qty 1

## 2014-01-02 MED ORDER — NICOTINE 21 MG/24HR TD PT24
21.0000 mg | MEDICATED_PATCH | TRANSDERMAL | Status: DC
  Administered 2014-01-02 – 2014-01-03 (×2): 21 mg via TRANSDERMAL
  Filled 2014-01-02 (×2): qty 1

## 2014-01-02 MED ORDER — DEXAMETHASONE SODIUM PHOSPHATE 4 MG/ML IJ SOLN
4.0000 mg | Freq: Three times a day (TID) | INTRAMUSCULAR | Status: DC
  Administered 2014-01-02 – 2014-01-03 (×4): 4 mg via INTRAVENOUS
  Filled 2014-01-02 (×4): qty 1

## 2014-01-02 MED ORDER — IPRATROPIUM-ALBUTEROL 20-100 MCG/ACT IN AERS: 1.0000 | INHALATION_SPRAY | Freq: Four times a day (QID) | RESPIRATORY_TRACT | Status: DC | PRN

## 2014-01-02 MED ORDER — CLONAZEPAM 0.5 MG PO TABS
0.5000 mg | ORAL_TABLET | Freq: Two times a day (BID) | ORAL | Status: DC | PRN
  Administered 2014-01-02 – 2014-01-03 (×3): 0.5 mg via ORAL
  Filled 2014-01-02 (×3): qty 1

## 2014-01-02 MED ORDER — HEPARIN (PORCINE) IN NACL 100-0.45 UNIT/ML-% IJ SOLN
1200.0000 [IU]/h | INTRAMUSCULAR | Status: DC
  Administered 2014-01-02: 1050 [IU]/h via INTRAVENOUS
  Administered 2014-01-03: 1200 [IU]/h via INTRAVENOUS
  Filled 2014-01-02: qty 250

## 2014-01-02 MED ORDER — HYDROXYZINE HCL 25 MG PO TABS: 25.0000 mg | ORAL_TABLET | Freq: Two times a day (BID) | ORAL | Status: DC | PRN

## 2014-01-02 MED ORDER — NITROGLYCERIN 2 % TD OINT
1.0000 [in_us] | TOPICAL_OINTMENT | Freq: Once | TRANSDERMAL | Status: AC
  Administered 2014-01-02: 1 [in_us] via TOPICAL
  Filled 2014-01-02: qty 1

## 2014-01-02 MED ORDER — ACETAMINOPHEN 325 MG PO TABS: 650.0000 mg | ORAL_TABLET | Freq: Four times a day (QID) | ORAL | Status: DC | PRN

## 2014-01-02 MED ORDER — ALUM & MAG HYDROXIDE-SIMETH 200-200-20 MG/5ML PO SUSP: 30.0000 mL | Freq: Four times a day (QID) | ORAL | Status: DC | PRN

## 2014-01-02 MED ORDER — DICLOFENAC SODIUM 1 % TD GEL
2.0000 g | Freq: Four times a day (QID) | TRANSDERMAL | Status: DC | PRN
  Filled 2014-01-02: qty 100

## 2014-01-02 MED ORDER — INSULIN DETEMIR 100 UNIT/ML ~~LOC~~ SOLN
5.0000 [IU] | Freq: Every day | SUBCUTANEOUS | Status: DC
Start: 2014-01-02 — End: 2014-01-03
  Administered 2014-01-02: 5 [IU] via SUBCUTANEOUS
  Filled 2014-01-02 (×6): qty 0.05

## 2014-01-02 MED ORDER — PANTOPRAZOLE SODIUM 40 MG PO TBEC
40.0000 mg | DELAYED_RELEASE_TABLET | Freq: Two times a day (BID) | ORAL | Status: DC
  Administered 2014-01-02 – 2014-01-03 (×3): 40 mg via ORAL
  Filled 2014-01-02 (×3): qty 1

## 2014-01-02 MED ORDER — LEVETIRACETAM IN NACL 500 MG/100ML IV SOLN
500.0000 mg | Freq: Two times a day (BID) | INTRAVENOUS | Status: DC
  Administered 2014-01-02 – 2014-01-03 (×3): 500 mg via INTRAVENOUS
  Filled 2014-01-02 (×14): qty 100

## 2014-01-02 MED ORDER — INSULIN ASPART 100 UNIT/ML ~~LOC~~ SOLN
0.0000 [IU] | Freq: Three times a day (TID) | SUBCUTANEOUS | Status: DC
  Administered 2014-01-02: 3 [IU] via SUBCUTANEOUS
  Administered 2014-01-02: 11 [IU] via SUBCUTANEOUS
  Administered 2014-01-03: 5 [IU] via SUBCUTANEOUS
  Administered 2014-01-03: 3 [IU] via SUBCUTANEOUS

## 2014-01-02 MED ORDER — MOMETASONE FURO-FORMOTEROL FUM 100-5 MCG/ACT IN AERO
2.0000 | INHALATION_SPRAY | Freq: Two times a day (BID) | RESPIRATORY_TRACT | Status: DC
  Administered 2014-01-02 – 2014-01-03 (×2): 2 via RESPIRATORY_TRACT
  Filled 2014-01-02: qty 8.8

## 2014-01-02 MED ORDER — LEVETIRACETAM IN NACL 500 MG/100ML IV SOLN
INTRAVENOUS | Status: AC
  Filled 2014-01-02: qty 100

## 2014-01-02 MED ORDER — SODIUM CHLORIDE 0.9 % IV SOLN: 250.0000 mL | INTRAVENOUS | Status: DC | PRN

## 2014-01-02 MED ORDER — FAMOTIDINE IN NACL 20-0.9 MG/50ML-% IV SOLN
20.0000 mg | Freq: Once | INTRAVENOUS | Status: AC
  Administered 2014-01-02: 20 mg via INTRAVENOUS
  Filled 2014-01-02: qty 50

## 2014-01-02 MED ORDER — FENOFIBRATE 54 MG PO TABS
54.0000 mg | ORAL_TABLET | Freq: Every day | ORAL | Status: DC
  Administered 2014-01-02 – 2014-01-03 (×2): 54 mg via ORAL
  Filled 2014-01-02 (×7): qty 1
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
## 2014-01-02 NOTE — Progress Notes (Addendum)
Inpatient Diabetes Program Recommendations  AACE/ADA: New Consensus Statement on Inpatient Glycemic Control (2013)  Target Ranges:  Prepandial:   less than 140 mg/dL      Peak postprandial:   less than 180 mg/dL (1-2 hours)      Critically ill patients:  140 - 180 mg/dL   Results for [name], CODISPOTI (MRN [id number]) as of 01/02/2014 09:29  Ref. Range 01/01/2014 06:24 01/01/2014 11:36 01/01/2014 15:43 01/02/2014 02:16 01/02/2014 08:11  Glucose-Capillary Latest Range: 70-99 mg/dL 149 (H) 320 (H) 346 (H) 237 (H) 234 (H)   Diabetes history:DM2 Outpatient Diabetes medications: Novolog 0-9 units TID with meals Current orders for Inpatient glycemic control: Novolog 0-9 units TID with meals  Inpatient Diabetes Program Recommendations Insulin - Basal: If Decadron will be continued, please consider ordering low dose basal insulin. Recommend starting with Levemir 5 units daily (based on 54 kg x 0.1 units). Correction (SSI): Noted Novolog sensitive correction was ordered this morning. Please consider adding Novolog bedtime correction scale. Insulin-meal coverage: If Decadron will be continued, may want to consider ordering Novolog meal coverage (in addition to Novolog correction). HgbA1C: Last A1C in the chart was 5.5% on 09/13/2013.  Thanks, Barnie Alderman, RN, MSN, CCRN, CDE Diabetes Coordinator Inpatient Diabetes Program [phone] (Team Pager) [phone] ([facility] office) [phone] [facility] office)

## 2014-01-02 NOTE — Care Management Utilization Note (Signed)
UR complete 
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
## 2014-01-02 NOTE — Care Management Note (Addendum)
    Page 1 of 1   01/03/2014     4:47:07 PM CARE MANAGEMENT NOTE 01/03/2014  Patient:  [name], [name]   Account Number:  [id number]  Date Initiated:  01/02/2014  Documentation initiated by:  Jolene Provost  Subjective/Objective Assessment:   Pt is from SNF. Pt plans to discharge to SNF. CSW aware of dishcarge plan and will arrange for return to facility at discharge. No CM needs at this time.     Action/Plan:   Anticipated DC Date:  01/03/2014   Anticipated DC Plan:  SKILLED NURSING FACILITY  In-house referral  Clinical Social Worker      DC Planning Services  CM consult      Choice offered to / List presented to:             Status of service:  Completed, signed off Medicare Important Message given?   (If response is "NO", the following Medicare IM given date fields will be blank) Date Medicare IM given:   Medicare IM given by:   Date Additional Medicare IM given:   Additional Medicare IM given by:    Discharge Disposition:    Per UR Regulation:  Reviewed for med. necessity/level of care/duration of stay  If discussed at [place] of Stay Meetings, dates discussed:    Comments:   01/03/14 [place] Rn/CM D/C to [facility] 01/02/2014 [place], RN, MSN, [facility]
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
## 2014-01-02 NOTE — Progress Notes (Signed)
Pt refusing the start of continuous tube feeding.  Notified Dyanne Carrel NP.  Will continue to monitor.
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
## 2014-01-02 NOTE — Progress Notes (Signed)
Pt complaining of unrelieved pain, PRN morphine (Q4H) given at 1623.  Pt does not want to take PRN Tylenol.  Paged Dr. Roderic Palau.  Will continue to monitor.
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
## 2014-01-03 ENCOUNTER — Inpatient Hospital Stay
Admission: RE | Admit: 2014-01-03 | Discharge: 2014-01-17 | Disposition: A | Discharge status: Home / Self Care | Payer: Medicaid Other | Source: Ambulatory Visit | Attending: Internal Medicine | Admitting: Internal Medicine

## 2014-01-03 ENCOUNTER — Other Ambulatory Visit (HOSPITAL_COMMUNITY): Payer: Medicaid Other

## 2014-01-03 ENCOUNTER — Inpatient Hospital Stay (HOSPITAL_COMMUNITY): Payer: Medicaid Other

## 2014-01-03 DIAGNOSIS — R1310 Dysphagia, unspecified: Principal | ICD-10-CM

## 2014-01-03 DIAGNOSIS — R062 Wheezing: Secondary | ICD-10-CM

## 2014-01-03 LAB — CBC
HEMATOCRIT: 35.7 % — AB (ref 39.0–52.0)
Hemoglobin: 11.5 g/dL — ABNORMAL LOW (ref 13.0–17.0)
MCH: 27.3 pg (ref 26.0–34.0)
MCHC: 32.2 g/dL (ref 30.0–36.0)
MCV: 84.8 fL (ref 78.0–100.0)
PLATELETS: 139 10*3/uL — AB (ref 150–400)
RBC: 4.21 MIL/uL — ABNORMAL LOW (ref 4.22–5.81)
RDW: 20 % — ABNORMAL HIGH (ref 11.5–15.5)
WBC: 5.9 10*3/uL (ref 4.0–10.5)

## 2014-01-03 LAB — HEPARIN LEVEL (UNFRACTIONATED): Heparin Unfractionated: 0.41 IU/mL (ref 0.30–0.70)

## 2014-01-03 LAB — BASIC METABOLIC PANEL
Anion gap: 9 (ref 5–15)
BUN: 27 mg/dL — AB (ref 6–23)
CALCIUM: 8.8 mg/dL (ref 8.4–10.5)
CHLORIDE: 98 meq/L (ref 96–112)
CO2: 31 mmol/L (ref 19–32)
CREATININE: 0.64 mg/dL (ref 0.50–1.35)
GFR calc Af Amer: >90 mL/min (ref >90)
GFR calc non Af Amer: >90 mL/min (ref >90)
Glucose, Bld: 269 mg/dL — ABNORMAL HIGH (ref 70–99)
Potassium: 4.4 mmol/L (ref 3.5–5.1)
Sodium: 138 mmol/L (ref 135–145)

## 2014-01-03 LAB — GLUCOSE, CAPILLARY
GLUCOSE-CAPILLARY: 191 mg/dL — AB (ref 70–99)
Glucose-Capillary: 244 mg/dL — ABNORMAL HIGH (ref 70–99)

## 2014-01-03 LAB — PROTIME-INR
INR: 0.99 (ref 0.00–1.49)
Prothrombin Time: 13.2 seconds (ref 11.6–15.2)

## 2014-01-03 MED ORDER — ENOXAPARIN SODIUM 60 MG/0.6ML ~~LOC~~ SOLN: 55.0000 mg | Freq: Two times a day (BID) | SUBCUTANEOUS | Status: DC

## 2014-01-03 MED ORDER — CLONAZEPAM 0.5 MG PO TABS: 0.5000 mg | ORAL_TABLET | Freq: Two times a day (BID) | ORAL | Status: DC | PRN

## 2014-01-03 MED ORDER — WARFARIN SODIUM 5 MG PO TABS: 5.0000 mg | ORAL_TABLET | Freq: Once | ORAL | Status: DC

## 2014-01-03 MED ORDER — NICOTINE 21 MG/24HR TD PT24: 21.0000 mg | MEDICATED_PATCH | TRANSDERMAL | Status: DC

## 2014-01-03 MED ORDER — DEXAMETHASONE SODIUM PHOSPHATE 4 MG/ML IJ SOLN: INTRAMUSCULAR | Status: DC

## 2014-01-03 MED ORDER — INSULIN DETEMIR 100 UNIT/ML ~~LOC~~ SOLN: 5.0000 [IU] | Freq: Every day | SUBCUTANEOUS | Status: DC

## 2014-01-03 MED ORDER — MORPHINE SULFATE 10 MG/5ML PO SOLN: 5.0000 mg | ORAL | Status: DC | PRN

## 2014-01-03 MED ORDER — LEVETIRACETAM IN NACL 500 MG/100ML IV SOLN: 500.0000 mg | Freq: Two times a day (BID) | INTRAVENOUS | Status: DC

## 2014-01-03 MED ORDER — DEXAMETHASONE SODIUM PHOSPHATE 4 MG/ML IJ SOLN: 2.0000 mg | Freq: Two times a day (BID) | INTRAMUSCULAR | Status: DC

## 2014-01-03 MED ORDER — SODIUM CHLORIDE 0.9 % IJ SOLN: 10.0000 mL | INTRAMUSCULAR | Status: DC | PRN

## 2014-01-03 MED ORDER — ENOXAPARIN SODIUM 60 MG/0.6ML ~~LOC~~ SOLN
55.0000 mg | Freq: Two times a day (BID) | SUBCUTANEOUS | Status: DC
  Administered 2014-01-03: 55 mg via SUBCUTANEOUS
  Filled 2014-01-03: qty 0.6

## 2014-01-03 MED ORDER — HYDROXYZINE HCL 25 MG PO TABS: 25.0000 mg | ORAL_TABLET | Freq: Two times a day (BID) | ORAL | Status: DC | PRN

## 2014-01-03 MED ORDER — SODIUM CHLORIDE 0.9 % IJ SOLN
10.0000 mL | Freq: Two times a day (BID) | INTRAMUSCULAR | Status: DC
  Administered 2014-01-03: 10 mL

## 2014-01-03 MED ORDER — WARFARIN SODIUM 5 MG PO TABS: ORAL_TABLET | ORAL | Status: DC

## 2014-01-03 MED ORDER — JEVITY 1.2 CAL PO LIQD: 1000.0000 mL | ORAL | Status: DC
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
## 2014-01-03 NOTE — Clinical Social Work Note (Signed)
Pt d/c today back to [facility]. Pt and facility aware and agreeable. CSW left voicemail with pt's son, [name] at his request. Pt to transfer with RN. CSW will fax d/c summary.  [name], [place]

## 2014-01-03 NOTE — Progress Notes (Signed)
Inpatient Diabetes Program Recommendations  AACE/ADA: New Consensus Statement on Inpatient Glycemic Control (2013)  Target Ranges:  Prepandial:   less than 140 mg/dL      Peak postprandial:   less than 180 mg/dL (1-2 hours)      Critically ill patients:  140 - 180 mg/dL   Results for VEDH, PTACEK (MRN [id number]) as of 01/03/2014 08:26  Ref. Range 01/02/2014 08:11 01/02/2014 11:41 01/02/2014 16:08 01/02/2014 21:07 01/03/2014 07:11  Glucose-Capillary Latest Range: 70-99 mg/dL 234 (H) 163 (H) 329 (H) 238 (H) 244 (H)   Diabetes history:DM2 Outpatient Diabetes medications: Novolog 0-9 units TID with meals Current orders for Inpatient glycemic control: Novolog 0-15 units TID with meals, Novolog 0-5 units HS, Levemir 5 units QHS  Inpatient Diabetes Program Recommendations Insulin - Basal: If steroids will be continued, please consider increasing Levemir to 10 units QHS. Correction (SSI): Please consider increasing Novolog correction to Resistant correction scale. Insulin - Meal Coverage: If steroids will be continued, please consider ordering Novolog 5 units TID with meals if patient eats at least 50% of meal.  Thanks, Barnie Alderman, RN, MSN, CCRN, CDE Diabetes Coordinator Inpatient Diabetes Program [phone] (Team Pager) [phone] (AP office) [phone] [facility] office)

## 2014-01-03 NOTE — Discharge Summary (Signed)
**Note [name]** Physician Discharge Summary  [name] UYQ:[id number] DOB: 01/18/1960 DOA: 01/01/2014  PCP: Lottie Mussel, MD  Admit date: 01/01/2014 Discharge date: 01/03/2014  Time spent:  minutes  Recommendations for Outpatient Follow-up:  1. Daily INR check with dosing of coumadin per pharmacy with lovenox bridge 2. Back to [facility] 3. Follow up with Dr Ellene Route as scheduled 4. Follow up with ID as schedulced  Discharge Diagnoses:  Principal Problem:   Acute pulmonary embolism Active Problems:   Type 2 diabetes mellitus   HYPERTENSION, BENIGN ESSENTIAL   GERD (gastroesophageal reflux disease)   Protein-calorie malnutrition, severe   Brain abscess   Dysphagia   Abnormal cardiac enzyme level   Pulmonary embolism   Chest pain   Discharge Condition: stable  Diet recommendation: mechanical soft thin liquid but he signed a waiver and eats regular  Filed Weights   01/01/14 2322  Weight: 54.432 kg (120 lb)    History of present illness:  [name] is a 53 y.o. male with a past medical history that includes PVD, stroke, hepatitis C, hypertension, hyperlipidemia, diabetes, COPD and most recently status post burr holes for biopsy brain mass currently on Rocephin and Flagyl as directed by infectious disease until 01/17/2014 presented emergency department on 01/02/14 with the chief complaint chest pain. Initial evaluation in the emergency department revealed pulmonary embolism.   Patient is resident of [facility] nursing facility. He reported developing sudden left anterior chest pain the evening before. His symptoms included some shortness of breath. He described the pain as constant rated a 9 out of 10. He denied any nausea vomiting diaphoresis. He reported that started shortly after he ate dinner. Of note he was just recently started back on regular food and initially he thought it was heartburn. Does have a history of heartburn as well as hiatal hernia. Was given a dose of Protonix  without relief.   Workup in the emergency department included complete metabolic panel significant for serum glucose of 240, albumin 2.8, AST 40 ALT 61, complete blood count with a hemoglobin of 10.8 platelets 115. Initial troponins negative. 2 troponins mildly elevated. CT of the chest revealed small amount of nonocclusive pulmonary embolus within the left main pulmonary artery, extending distally into the left upper and lower lobes. Interval cavitation at the right middle lobe, with chronic soft tissue thickening and minimal residual airspace opacity at the site of the patient's prior pneumonia. Minimal residual airspace opacity also noted at the superior aspect of the left lower lobe, concerning for residual infection. EKG NSR no acute changes.   History and on a heparin drip in the emergency department.  Hospital Course:  Acute pulmonary embolism: Heparin initiated in the emergency department transitioned to coumadin with lovenox bridge 01/03/14. At discharge was in no distress not hypoxic and no chest pain. Spoke to Dr Ellene Route neurosurgery who had no preference of anti-coag used but did voice concern about compliance once released from nursing facility. At discharge INR 0.99. Will be discharged with Lovenox and request for coumadin dosing daily at facility  Active Problems:  Interval cavitation right middle lobe: Per CT chest. likely related to recent pna. No s/sx infectious process. Not hypoxic. No indication of TB.    Type 2 diabetes mellitus:  Likely related to continue Decadron use. He is on sliding scale at mealtime at the nursing facility. Serum glucose greater than 200 on admission. Expect improvement with decadron taper  Abnormal cardiac enzyme level: mild. Likely related to demand ischemia related to #  1. Continue supportive therapy. Repeat EKG without acute changes.   Protein-calorie malnutrition, severe: recently converted to regular diet per [facility]. Reportedly patient in  non-compliant and contrary at [facility] center.   GERD (gastroesophageal reflux disease): continue PPI    HYPERTENSION, BENIGN ESSENTIAL: fair control.  Not on anti-hypertensive meds. 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 Per chart review on rocephin and flagyl until follow up with ID in 01/17/14. 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 AFB and fungal cultures were still pending but no acid-fast yeast or fungal elements have been identified to date. Blood cultures were negative. Antibiotics were narrowed to ceftriaxone and Flagyl. It was felt that he was too frail for a TEE. Plan is to receive eight weeks of antibiotics, to end on 01/25/2014.discused with Dr Ellene Route who recommended CT head expecting smaller abscesses as well as rapid decadron taper.     Dysphagia: remained stable at baseline. On mechanical soft thins at facility with signed waiver. recently tube feedings discontinued per chart due to non-compliance with NPO recommendations. Nutritional consult appreciated. Will consider repeat swallow eval.      Procedures:    Consultations:  Dr Ellene Route via phone  Discharge Exam: Filed Vitals:   01/03/14 0540  BP: 131/65  Pulse: 85  Temp: 98.1 F (36.7 C)  Resp: 18    General: well nourished appears comfortable Cardiovascular: RRR No MGR No LE edema Respiratory: normal effort BS clear bilaterally no wheeze  Discharge Instructions   Discharge Instructions    warfarin (COUMADIN) per pharmacy consult    Complete by:  As directed   Will need daily INR and dosing of coumadin          Current Discharge Medication List    START taking these medications   Details  enoxaparin (LOVENOX) 60 MG/0.6ML injection Inject 0.55 mLs (55 mg total) into the skin 2 (two) times daily. Until INR therapeutic and/or 5 days. Qty: 0 Syringe    insulin detemir (LEVEMIR) 100 UNIT/ML injection Inject 0.05 mLs (5 Units total) into the skin at bedtime. Qty:  10 mL, Refills: 11    warfarin (COUMADIN) 5 MG tablet Daily INR check and daily dosing per pharmacy until INR therapeutic      CONTINUE these medications which have CHANGED   Details  clonazePAM (KLONOPIN) 0.5 MG tablet Take 1 tablet (0.5 mg total) by mouth 2 (two) times daily as needed (anxiety). Qty: 30 tablet, Refills: 0    dexamethasone (DECADRON) 4 MG/ML injection Give 2 mg IV BID for 3 days then give 1mg  IV BID for 3 days then give 1 mg daily for 3 days then stop Qty: 1 mL    hydrOXYzine (ATARAX/VISTARIL) 25 MG tablet Take 1 tablet (25 mg total) by mouth 2 (two) times daily as needed for itching. Qty: 30 tablet, Refills: 0    nicotine (NICODERM CQ - DOSED IN MG/24 HOURS) 21 mg/24hr patch Place 1 patch (21 mg total) onto the skin daily. Qty: 28 patch, Refills: 0    Nutritional Supplements (FEEDING SUPPLEMENT, JEVITY 1.2 CAL,) LIQD Place 1,000 mLs into feeding tube continuous. Qty: 1500 mL, Refills: 12      CONTINUE these medications which have NOT CHANGED   Details  albuterol (PROVENTIL HFA;VENTOLIN HFA) 108 (90 BASE) MCG/ACT inhaler Inhale 2 puffs into the lungs every 6 (six) hours as needed for wheezing or shortness of breath.    albuterol (PROVENTIL) (2.5 MG/3ML) 0.083% nebulizer solution Take 3 mLs (2.5 mg total) by  nebulization every 6 (six) hours as needed for wheezing or shortness of breath. Qty: 75 mL, Refills: 12   Associated Diagnoses: Chronic airway obstruction, not elsewhere classified    Balsam [place]-Castor Oil (VENELEX) OINT Apply 1 application topically every evening.    cefTRIAXone (ROCEPHIN) 40 MG/ML IVPB Inject 50 mLs (2 g total) into the vein every 12 (twelve) hours. Qty: 4000 mL, Refills: 0    diclofenac sodium (VOLTAREN) 1 % GEL Apply 2 g topically 4 (four) times daily as needed (knee pain).     fenofibrate 54 MG tablet Take 54 mg by mouth daily.    Fluticasone-Salmeterol (ADVAIR DISKUS) 100-50 MCG/DOSE AEPB Inhale 1 puff into the lungs 2 (two) times  daily. Qty: 60 each, Refills: 3   Associated Diagnoses: Chronic airway obstruction, not elsewhere classified; Dyspnea    insulin aspart (NOVOLOG) 100 UNIT/ML injection Inject 0-9 Units into the skin 3 (three) times daily with meals. Qty: 10 mL, Refills: 11    Ipratropium-Albuterol (COMBIVENT RESPIMAT) 20-100 MCG/ACT AERS respimat Inhale 1 puff into the lungs every 6 (six) hours as needed for wheezing or shortness of breath ((May take up to 6 puffs daily if needed.).    levETIRAcetam (KEPRRA) 500 MG/100ML SOLN Inject 100 mLs (500 mg total) into the vein every 12 (twelve) hours. Qty: 4000 mL, Refills: 5    metroNIDAZOLE (FLAGYL) 5-0.79 MG/ML-% IVPB Inject 100 mLs (500 mg total) into the vein every 6 (six) hours. Qty: 100 mL, Refills: 40    morphine 10 MG/5ML solution Place 2.5 mLs (5 mg total) into feeding tube every 4 (four) hours as needed for severe pain. Qty: 100 mL, Refills: 0    Multiple Vitamin (MULTIVITAMIN WITH MINERALS) TABS Take 1 tablet by mouth daily.    pantoprazole (PROTONIX) 40 MG tablet Take 40 mg by mouth 2 (two) times daily.     QUEtiapine (SEROQUEL) 50 MG tablet Take 50 mg by mouth at bedtime. For 2 weeks then d/c.    trypsin-balsam-castor oil (XENADERM) ointment Apply 1 application topically 3 (three) times daily. Apply to right and left buttock every shift and as needed.    Vitamins A & D (VITAMIN A & D) ointment Apply 1 application topically 2 (two) times daily. Apply to bilateral lower extremities and feet.      STOP taking these medications     mirtazapine (REMERON) 7.5 MG tablet        Allergies  Allergen Reactions  . Fish Allergy Hives, Swelling and Rash  . Buprenorphine Hcl-Naloxone Hcl Other (See Comments)    "feel like going to die" jittery, trouble breathing, feels hot  . Shellfish Allergy Hives  . Benadryl [Diphenhydramine Hcl] Hives, Itching and Rash   Follow-up Information    Follow up with Lottie Mussel, MD.   Specialty:  Internal Medicine    Contact information:   [place] [address] [phone]        The results of significant diagnostics from this hospitalization (including imaging, microbiology, ancillary and laboratory) are listed below for reference.    Significant Diagnostic Studies: Ct Head Wo Contrast  12/06/2013   CLINICAL DATA:  Brain abscess status post aspiration  EXAM: CT HEAD WITHOUT CONTRAST  TECHNIQUE: Contiguous axial images were obtained from the base of the skull through the vertex without intravenous contrast.  COMPARISON:  Head CT 12/04/2013  FINDINGS: New bifrontal bur holes for surgical access. No acute osseous findings.  No mastoid or sinus opacification.  Three brain abscesses status post  decompression:  *A high right frontal white matter abscess is decompressed, currently 23 mm in diameter, previously 27 mm. *An abscess centered in the right anterior putamen is also smaller, currently 28 mm in diameter, previously 24mm. There is an associated decrease in mass effect on the frontal horn of the wall right lateral ventricle and decrease in edema. *A peripheral high and posterior left frontal abscess is stable at 25 mm. This cavity contains gas, fluid, and a tiny focus of postoperative hemorrhage.  Mild decreased in vasogenic edema. Leftward shift it is currently 4 mm. No evidence of intraventricular debris or hydrocephalus. No acute infarct. Postoperative pneumocephalus.  IMPRESSION: 1. Three cerebral abscesses status post decompression. Vasogenic edema and mass effect is improving. 2. Tiny postoperative hemorrhage in the left frontal cavity. 3. No ventriculomegaly or ventricular debris.   Electronically Signed   By: Jorje Guild M.D.   On: 12/06/2013 05:17   Ct Head Wo Contrast  12/04/2013   CLINICAL DATA:  Altered mental status.  Multiple brain lesions.  EXAM: CT HEAD WITHOUT CONTRAST  TECHNIQUE: Contiguous axial images were obtained from the base of the skull through the vertex without  intravenous contrast.  COMPARISON:  MRI dated 12/01/2013 and is CT scan dated 11/30/2013  FINDINGS: There has been rapidly progression in the apparent size of brain lesions in the right basal ganglia, posterior left frontal lobe, and right frontal centrum semiovale. There is increased edema around each lesion with increased compression of frontal horn of the right lateral ventricle with new midline shift of 3.7 mm.  IMPRESSION: Rapid progression in the size of the multiple brain lesions with due right to left midline shift and increased edema surrounding the lesions. The possibility of progressive brain abscess should be considered particularly in view of the rapid progression.   Electronically Signed   By: Rozetta Nunnery M.D.   On: 12/04/2013 18:51   Ct Head W Contrast  12/05/2013   CLINICAL DATA:  53 year old male with intracranial abscesses. Stealth exam. Pre surgical. Subsequent encounter.  EXAM: CT HEAD WITH CONTRAST  TECHNIQUE: Contiguous axial images were obtained from the base of the skull through the vertex with intravenous contrast.  CONTRAST:  17mL OMNIPAQUE IOHEXOL 300 MG/ML  SOLN  COMPARISON:  12/04/2013 head CT.  12/01/2013 brain MR.  FINDINGS: Three intracranial ring-enhancing lesions suggestive of intracranial abscesses which have increased in size from the recent MR. This includes posterior left frontal - parietal lobe 3 cm ring-enhancing lesion, posterior right frontal lobe 2.7 cm enhancing lesion and right basal ganglia 3.2 cm enhancing lesion.  Marked surrounding vasogenic edema and local mass effect including compression of the right lateral ventricle and bowing of the third ventricle to left.  No evidence of acute thrombotic infarct or intracranial hemorrhage.  IMPRESSION: Three intracranial ring-enhancing lesions suggestive of intracranial abscesses which have increased in size from the recent MR. This includes posterior left frontal - parietal lobe 3 cm ring-enhancing lesion, posterior  right frontal lobe 2.7 cm enhancing lesion and right basal ganglia 3.2 cm enhancing lesion.  Marked surrounding vasogenic edema and local mass effect including compression of the right lateral ventricle and bowing of the third ventricle to left.   Electronically Signed   By: Chauncey Cruel M.D.   On: 12/05/2013 11:46   Ct Angio Chest Pe W/cm &/or Wo Cm  01/02/2014   CLINICAL DATA:  Acute onset of generalized chest pain and tachycardia. Initial encounter.  EXAM: CT ANGIOGRAPHY CHEST WITH CONTRAST  TECHNIQUE: Multidetector CT  imaging of the chest was performed using the standard protocol during bolus administration of intravenous contrast. Multiplanar CT image reconstructions and MIPs were obtained to evaluate the vascular anatomy.  CONTRAST:  159mL OMNIPAQUE IOHEXOL 350 MG/ML SOLN  COMPARISON:  Chest radiograph performed 01/01/2014, and CT of the chest performed 12/01/2013  FINDINGS: A small amount of nonocclusive pulmonary embolus is noted within the left main pulmonary artery, extending distally into the left upper and lower lobes.  There has been interval cavitation within the right middle lobe, with chronic soft tissue thickening and minimal residual opacity, at the site of the patient's prior pneumonia. Minimal airspace opacity is also noted within the superior aspect of the left lower lobe. There is no evidence of pleural effusion or pneumothorax. No masses are identified; no abnormal focal contrast enhancement is seen.  Scattered coronary artery calcification is noted. The mediastinum is otherwise unremarkable. No mediastinal lymphadenopathy is seen. No pericardial effusion is identified. The great vessels are grossly unremarkable in appearance. No axillary lymphadenopathy is seen. The visualized portions of the thyroid gland are unremarkable in appearance.  The visualized portions of the liver and spleen are unremarkable.  No acute osseous abnormalities are seen. Chronic left-sided rib deformities and left  clavicular deformity are again noted.  Review of the MIP images confirms the above findings.  IMPRESSION: 1. Small amount of nonocclusive pulmonary embolus within the left main pulmonary artery, extending distally into the left upper and lower lobes. 2. Interval cavitation at the right middle lobe, with chronic soft tissue thickening and minimal residual airspace opacity at the site of the patient's prior pneumonia. Minimal residual airspace opacity also noted at the superior aspect of the left lower lobe, concerning for residual infection.  Critical Value/emergent results were called by telephone at the time of interpretation on 01/02/2014 at 5:35 am to Dr. Rolland Porter, who verbally acknowledged these results.   Electronically Signed   By: Garald Balding M.D.   On: 01/02/2014 05:39   Mr Cervical Spine W Wo Contrast  12/04/2013   CLINICAL DATA:  History of stroke, MRSA infection and intravenous drug use presents with fever and leg pain, multiple falls. Diagnosed with intracranial lesions on December 01, 2013 which may reflect abscess or metastatic disease.  EXAM: MRI TOTAL SPINE WITHOUT AND WITH CONTRAST  TECHNIQUE: Multisequence MR imaging of the spine from the cervical spine to the sacrum was performed prior to and following IV contrast administration for evaluation of spinal metastatic disease.  CONTRAST:  12mL MULTIHANCE GADOBENATE DIMEGLUMINE 529 MG/ML IV SOLN  COMPARISON:  CT of the chest, abdomen and pelvis December 09, 2013; MRI of the brain December 01, 2013  FINDINGS: Cervical Findings:  Cervical vertebral bodies and posterior elements appear intact and aligned with maintenance of cervical lordosis. Mild motion degraded examination. Status post C4-5 ACDF, which results in some susceptibility artifact. Mild to moderate C5-6 degenerative disc, with decreased T2 signal within all cervical disc consistent with desiccation. Mild chronic discogenic endplate changes at all cervical levels. No STIR signal  abnormality to suggest acute osseous process. Limited post gadolinium sequence due to spinal hardware,  Cervical spinal cord appears normal morphology and signal characteristics from the cervical medullary junction to at least level of T3-4, most caudal well visualized level. Craniocervical junction maintained. 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 No convincing evidence of abnormal cord, leptomeningeal or epidural enhancement though, limited post gadolinium sagittal sequences and, due to patient's intolerance for further imaging, axial T1 post  gadolinium sequence not obtained.  Level by level evaluation:  C2-3: No disc bulge, canal stenosis or neural foraminal narrowing.  C3-4: Uncovertebral hypertrophy and minimal annular bulging without canal stenosis or neural foraminal narrowing.  C4-5 ACDF.  C5-6: 3 mm RIGHT central broad-based disc protrusion. Uncovertebral hypertrophy. Moderate canal stenosis. Severe RIGHT, moderate to severe LEFT neural foraminal narrowing. There is likely a component adjacent segment disease.  C6-7: 2 mm broad-based disc bulge, mild canal stenosis. Minimal neural foraminal narrowing.  C7-T1: No disc bulge, canal stenosis nor neural foraminal narrowing.  Thoracic Findings:  Mild chronic T4 compression fracture. T2 superior endplate Schmorl's node. No acute fracture. No malalignment. Mild T6 70 T7-8 degenerative discs. Nose abnormal bone marrow signal to suggest acute osseous process. No suspicious osseous nor intradiscal enhancement.  Spinal cord appears normal morphology and signal characteristics with conus medullaris which is partially imaged at T12. No abnormal cord, leptomeningeal or upper dural enhancement. Included prevertebral and paraspinal soft tissues are nonsuspicious.  No significant disc bulge, canal stenosis or neural foraminal narrowing at any thoracic level. Moderate T8-9 facet arthropathy.  Lumbar Findings:  Lumbar vertebral bodies and  posterior elements are intact and aligned with maintenance of the lumbar lordosis. Moderate L5-S1 degenerative disc, mild desiccation L3-4 and L4-5. Mild subacute to chronic discogenic endplate changes V5-6 through L4-5. No STIR signal abnormality to suggest acute osseous process. No suspicious osseous nor intradiscal enhancement.  Conus medullaris terminates at T12-L1 and appears normal in morphology and signal characteristics. Cauda equina is unremarkable. No suspicious cord, leptomeningeal nor epidural enhancement. Included prevertebral and paraspinal soft tissues are nonsuspicious.  Level by level evaluation:  L1-2 and L2-3: No disc bulge, canal stenosis nor neural foraminal narrowing.  L3-4: 2 mm broad-based disc bulge, mild facet arthropathy and ligamentum flavum redundancy. Minimal canal stenosis. Mild bilateral neural foraminal narrowing.  L4-5: 2 mm broad-based disc bulge. Mild to moderate facet arthropathy and ligamentum flavum redundancy. Minimal canal stenosis. Moderate bilateral neural foraminal narrowing.  L5-S1: Conjoined RIGHT L5-S1 nerve root. No significant disc bulge. Moderate facet arthropathy without canal stenosis. Mild RIGHT neural foraminal narrowing.  IMPRESSION: MRI CERVICAL SPINE: Severely limited sagittal post gad. Axial T1 post gadolinium sequences not obtained due to inability to tolerate further imaging.  No suspicious MR findings to suggest metastatic disease or infection with cervical spine.  Status post C4-5 ACDF. 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  Moderate canal stenosis at C5-6, mild at C6-7. Severe RIGHT C5-6 neural foraminal narrowing.  MRI THORACIC SPINE: No MR findings to suggest metastatic disease or infection within the thoracic spine.  Mild thoracic degenerative change without neurocompressive findings.  Remote Mild T4 compression fracture.  MRI LUMBAR SPINE: No MR findings to suggest metastatic disease or  infection within the lumbar spine.  Degenerative change of the lumbar spine. Minimal canal stenosis L3-4 and L4-5.  Neural foraminal narrowing L3-4 through L5-S1: Moderate bilaterally at L4-5.  Acute findings discussed with and reconfirmed by Dr.JULIE MALLORY on 12/04/2013 at 11:33 pm.   Electronically Signed   By: Elon Alas   On: 12/04/2013 23:34   Mr Thoracic Spine W Wo Contrast  12/04/2013   CLINICAL DATA:  History of stroke, MRSA infection and intravenous drug use presents with fever and leg pain, multiple falls. Diagnosed with intracranial lesions on December 01, 2013 which may reflect abscess or metastatic disease.  EXAM: MRI TOTAL SPINE WITHOUT AND WITH CONTRAST  TECHNIQUE: Multisequence MR imaging of the spine  from the cervical spine to the sacrum was performed prior to and following IV contrast administration for evaluation of spinal metastatic disease.  CONTRAST:  21mL MULTIHANCE GADOBENATE DIMEGLUMINE 529 MG/ML IV SOLN  COMPARISON:  CT of the chest, abdomen and pelvis December 09, 2013; MRI of the brain December 01, 2013  FINDINGS: Cervical Findings:  Cervical vertebral bodies and posterior elements appear intact and aligned with maintenance of cervical lordosis. Mild motion degraded examination. Status post C4-5 ACDF, which results in some susceptibility artifact. Mild to moderate C5-6 degenerative disc, with decreased T2 signal within all cervical disc consistent with desiccation. Mild chronic discogenic endplate changes at all cervical levels. No STIR signal abnormality to suggest acute osseous process. Limited post gadolinium sequence due to spinal hardware,  Cervical spinal cord appears normal morphology and signal characteristics from the cervical medullary junction to at least level of T3-4, most caudal well visualized level. Craniocervical junction maintained. 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 No convincing evidence of abnormal cord, leptomeningeal  or epidural enhancement though, limited post gadolinium sagittal sequences and, due to patient's intolerance for further imaging, axial T1 post gadolinium sequence not obtained.  Level by level evaluation:  C2-3: No disc bulge, canal stenosis or neural foraminal narrowing.  C3-4: Uncovertebral hypertrophy and minimal annular bulging without canal stenosis or neural foraminal narrowing.  C4-5 ACDF.  C5-6: 3 mm RIGHT central broad-based disc protrusion. Uncovertebral hypertrophy. Moderate canal stenosis. Severe RIGHT, moderate to severe LEFT neural foraminal narrowing. There is likely a component adjacent segment disease.  C6-7: 2 mm broad-based disc bulge, mild canal stenosis. Minimal neural foraminal narrowing.  C7-T1: No disc bulge, canal stenosis nor neural foraminal

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
## 2014-01-03 NOTE — Discharge Instructions (Signed)
Information on my medicine - Coumadin®   (Warfarin) ° °This medication education was reviewed with me or my healthcare representative as part of my discharge preparation.  The pharmacist that spoke with me during my hospital stay was:  Dustine Stickler A, RPH ° °Why was Coumadin prescribed for you? °Coumadin was prescribed for you because you have a blood clot or a medical condition that can cause an increased risk of forming blood clots. Blood clots can cause serious health problems by blocking the flow of blood to the heart, lung, or brain. Coumadin can prevent harmful blood clots from forming. °As a reminder your indication for Coumadin is:   Pulmonary Embolism Treatment ° °What test will check on my response to Coumadin? °While on Coumadin (warfarin) you will need to have an INR test regularly to ensure that your dose is keeping you in the desired range. The INR (international normalized ratio) number is calculated from the result of the laboratory test called prothrombin time (PT). ° °If an INR APPOINTMENT HAS NOT ALREADY BEEN MADE FOR YOU please schedule an appointment to have this lab work done by your health care provider within 7 days. °Your INR goal is usually a number between:  2 to 3 or your provider may give you a more narrow range like 2-2.5.  Ask your health care provider during an office visit what your goal INR is. ° °What  do you need to  know  About  COUMADIN? °Take Coumadin (warfarin) exactly as prescribed by your healthcare provider about the same time each day.  DO NOT stop taking without talking to the doctor who prescribed the medication.  Stopping without other blood clot prevention medication to take the place of Coumadin may increase your risk of developing a new clot or stroke.  Get refills before you run out. ° °What do you do if you miss a dose? °If you miss a dose, take it as soon as you remember on the same day then continue your regularly scheduled regimen the next day.  Do not take two  doses of Coumadin at the same time. ° °Important Safety Information °A possible side effect of Coumadin (Warfarin) is an increased risk of bleeding. You should call your healthcare provider right away if you experience any of the following: °  Bleeding from an injury or your nose that does not stop. °  Unusual colored urine (red or dark brown) or unusual colored stools (red or black). °  Unusual bruising for unknown reasons. °  A serious fall or if you hit your head (even if there is no bleeding). ° °Some foods or medicines interact with Coumadin® (warfarin) and might alter your response to warfarin. To help avoid this: °  Eat a balanced diet, maintaining a consistent amount of Vitamin K. °  Notify your provider about major diet changes you plan to make. °  Avoid alcohol or limit your intake to 1 drink for women and 2 drinks for men per day. °(1 drink is 5 oz. wine, 12 oz. beer, or 1.5 oz. liquor.) ° °Make sure that ANY health care provider who prescribes medication for you knows that you are taking Coumadin (warfarin).  Also make sure the healthcare provider who is monitoring your Coumadin knows when you have started a new medication including herbals and non-prescription products. ° °Coumadin® (Warfarin)  Major Drug Interactions  °Increased Warfarin Effect Decreased Warfarin Effect  °Alcohol (large quantities) °Antibiotics (esp. Septra/Bactrim, Flagyl, Cipro) °Amiodarone (Cordarone) °Aspirin (ASA) °Cimetidine (Tagamet) °  Feldene) °Propafenone (Rythmol SR) °Propranolol (Inderal) °Isoniazid (INH) °Posaconazole (Noxafil) Barbiturates (Phenobarbital) °Carbamazepine (Tegretol) °Chlordiazepoxide (Librium) °Cholestyramine (Questran) °Griseofulvin °Oral Contraceptives °Rifampin °Sucralfate (Carafate) °Vitamin K  ° °Coumadin® (Warfarin) Major Herbal Interactions  °Increased Warfarin Effect Decreased Warfarin Effect  °Garlic °Ginseng °Ginkgo biloba Coenzyme  Q10 °Green tea °St. John’s wort   ° °Coumadin® (Warfarin) FOOD Interactions  °Eat a consistent number of servings per week of foods HIGH in Vitamin K °(1 serving = ½ cup)  °Collards (cooked, or boiled & drained) °Kale (cooked, or boiled & drained) °Mustard greens (cooked, or boiled & drained) °Parsley *serving size only = ¼ cup °Spinach (cooked, or boiled & drained) °Swiss chard (cooked, or boiled & drained) °Turnip greens (cooked, or boiled & drained)  °Eat a consistent number of servings per week of foods MEDIUM-HIGH in Vitamin K °(1 serving = 1 cup)  °Asparagus (cooked, or boiled & drained) °Broccoli (cooked, boiled & drained, or raw & chopped) °Brussel sprouts (cooked, or boiled & drained) *serving size only = ½ cup °Lettuce, raw (green leaf, endive, romaine) °Spinach, raw °Turnip greens, raw & chopped  ° °These websites have more information on Coumadin (warfarin):  www.coumadin.com; °www.ahrq.gov/consumer/coumadin.htm; ° ° °

## 2014-01-04 ENCOUNTER — Other Ambulatory Visit: Payer: Self-pay

## 2014-01-04 MED ORDER — CLONAZEPAM 0.5 MG PO TABS: 0.5000 mg | ORAL_TABLET | Freq: Two times a day (BID) | ORAL | Status: DC | PRN

## 2014-01-04 MED ORDER — MORPHINE SULFATE 10 MG/5ML PO SOLN: ORAL | Status: DC

## 2014-01-04 NOTE — Addendum Note (Signed)
Addended by: Logan Bores on: 01/04/2014 08:55 AM   Modules accepted: Orders

## 2014-01-04 NOTE — Progress Notes (Signed)
Reported to H.Bullins, RN at the [facility], discharged to in stable condition via bed with staff, picc line intact to RUA.

## 2014-01-04 NOTE — Telephone Encounter (Signed)
RX faxed to [facility] @ [phone]. Phone number [phone]  

## 2014-01-04 NOTE — Care Management Utilization Note (Signed)
UR complete 

## 2014-01-05 ENCOUNTER — Other Ambulatory Visit (HOSPITAL_COMMUNITY)
Admission: RE | Admit: 2014-01-05 | Discharge: 2014-01-05 | Disposition: A | Discharge status: Home / Self Care | Payer: Medicaid Other | Source: Ambulatory Visit | Attending: Internal Medicine | Admitting: Internal Medicine

## 2014-01-05 ENCOUNTER — Non-Acute Institutional Stay (SKILLED_NURSING_FACILITY): Payer: Medicaid Other | Admitting: Internal Medicine

## 2014-01-05 ENCOUNTER — Encounter: Payer: Self-pay | Admitting: Internal Medicine

## 2014-01-05 DIAGNOSIS — G060 Intracranial abscess and granuloma: Secondary | ICD-10-CM

## 2014-01-05 DIAGNOSIS — F39 Unspecified mood [affective] disorder: Secondary | ICD-10-CM

## 2014-01-05 DIAGNOSIS — J42 Unspecified chronic bronchitis: Secondary | ICD-10-CM

## 2014-01-05 DIAGNOSIS — I2699 Other pulmonary embolism without acute cor pulmonale: Secondary | ICD-10-CM

## 2014-01-05 DIAGNOSIS — K219 Gastro-esophageal reflux disease without esophagitis: Secondary | ICD-10-CM

## 2014-01-05 NOTE — Progress Notes (Signed)
Patient ID: [name], male   DOB: [birth year], 53 y.o.   MRN: [id number]   This is an acute visit.  Level care skilled.  Facility [facility].  Chief complaint-acute visit status post hospitalization for pulmonary embolism.  History of present illness.  Patient is a 53 year old male with a history of peripheral vascular disease CVA hepatitis C hypertension hyperlipidemia diabetes COPD and most recently status post bur holes for biopsy of a brain mass.  He is currently on Rocephin and Flagyl as directed by infectious disease until 01/17/2014.  On 01/02/2014 presented to the ER with chest pain and was diagnosed with an acute pulmonary embolism.  CT of the chest showed a small amount of nonocclusive pulmonary embolus within the left main pulmonary artery extending distally into the left upper and lower lobes-.  Keppra was initiated in the ER he was transitioned to Coumadin with Lovenox bridge-he was discharged with no chest pain and was not hypoxic.  Neurosurgery also was consultant about this.  His INR is trending up at 1.8 today he will continue Lovenox until INR is therapeutic update INR is scheduled for tomorrow.  In regards to other issues CT did show interval cavitation of the right middle lobe-this was thought most likely secondary to recent pneumonia.  He is a type II diabetic he is on Decadron-continues on a sliding scale as well as low-dose Lantus routinely.  Patient continues with severe protein calorie malnutrition-she has been converted to a regular diet and says he is eating well in fact he had apparently Christmas dinner with family today outside the facility-this has been somewhat of an issue however with compliance at the facility.  In regards to the brain abscesses status post bur holes for biopsy-he is on Rocephin and Flagyl until early January-cultures were positive for streptococci-AFB and fungal cultures are pending but no acid-fast she start fungal elements  have been identified to date.  Negative blood cultures.  It was felt he was too frail for TEE-.  Patient in regards to dysphagia is on a mechanical soft thin liquids at facility with a waiver signed-.  Previous medical history.  Acute pulmonary embolism.  Type 2 diabetes.  Hypertension.  GERD.  Protein calorie malnutrition.  Brain abscess.  Dysphagia.  Family medical social history as been reviewed per admission note on 12/19/1998.  Patient apparently lives with his wife [name] have a history of heroin and cocaine use.  He currently smokes.  Medications.  Lovenox 55 mg twice a day injection.  Levemir 5 units daily at bedtime.  Coumadin 5 mg daily.  Klonopin 0.5 mg twice a day when necessary.  Dexamethasone 2 mg IV twice a day for an additional day and then 1 mg daily for 3 days and then DC.  Hydroxyzine 25 mg twice a day when necessary itching.  NicoDerm patch which has been discontinued since patient continues to smoke.  Feeding supplement thousand milliliters continuous.  Albuterol inhaler 2 puffs every 6 hours when necessary.  Albuterol nebulizer every 6 hours when necessary.  Rocephin 50 mL-equal 2 g into the vein every 12 hours.  Voltaren gel topically 4 times a day as needed.  Fenofibrate 54 mg daily.  Advair Diskus 1 puff Lungs twice a day.  NovoLog sliding scale insulin with meals 3 times a day.  Combivent inhaler 1 puff into lungs every 6 hours when necessary.  Keppra 500 mg twice a day.  Flagyl 500 mg every 6 hours via IV.  Morphine 5 mg every  4 hours when necessary for severe pain.  Protonic 40 mg twice a day.  Seroquel 50 mg daily at bedtime for approximately 2 weeks then DC.  Xenaderm3 times a day to right left buttocks every shift when necessary.  Vitamins a and D twice a day topically to bilateral lower extremities and feet  .    Review of systems.  General does not complain of fever or chills-is anxious to  go home.  Skin does not complain of rashes or itching.  Head ears eyes nose mouth and throat-does not complaining of any visual changes or sore throat.  Respiratory does not complain of shortness of breath or cough this evening-.  Cardiac does not complaining of any chest pain.  GI-says he is eating better-again is eating by mouth now-does not complain of abdominal discomfort does have a history of GERD.  Musculoskeletal has chronic pain-currently on morphine this appears to be fairly effective although at times he will still complain of pain issues to nursing staff-nuclear lower extremity weakness but has gained some strength during his stay here.  Neurologic is not complaining of dizziness or syncopal-type feelings or numbness.  Psych-does appear to have some history of anxiety he is on Klonopin 2 times a day as needed.  Also continues on Seroquel although this is being DC'd approximately 2 weeks this is for mood disorder and was started by psychiatric services.  . School exam.  Temperature 97.2 pulse 100 respirations 20 blood pressure 136/74  In general this is a frail middle-aged male who looks somewhat older than his stated age she is resting in bed appears to be comfortable.  Skin is warm and dry I do note he has a PICC line right upper arm which appears benign.  Healed surgical scar in the top of his head is noted  Eyes pupils appear reactive to light sclera and conjunctiva are clear visual acuity appears grossly intact.  Oropharynx is clear mucous membranes appear fairly moist he does have numerous extractions very  Chest has shallow air entry but could not appreciate any labored breathing or congestion.  Heart is slightly tachycardic at 100-without murmur gallop or rub he does not have any lower extremity edema.  Abdomen is soft does not appear to be tender there are positive bowel sounds PEG site appears unremarkable.  Musculoskeletal has significant frailty muscle  wasting is able to move all extremities 4 compared to previous assessments appears to have gained some strength in his lower extremities grip strength is intact bilaterally --he states he is able to stand with some assistance.    Neurologic his speech appears to be clear I do not note any lateralizing findings  Psych he appears alert and oriented is cooperative with exam-  Labs.  01/03/2014.  WBC 5.9 hemoglobin 11.5 platelets 139.  Sodium 138 potassium 4.4 BUN 27 creatinine 0.64 CO2 level was 31.  01/01/2014.  AST 40 ALT 61 bilirubin 0.2 albumin of 2.8.  Assessment and plan.  #1-pulmonary embolism-again continues on anticoagulation with Lovenox and Coumadin-INR is trending up-update INR tomorrow DC Lovenox when INR is therapeutic which I suspect will be in the next day or 2-clinically appears stable without complaints of chest pain or shortness of breath.  #2-history of brain abscess-again he is on long-term antibiotics-this is followed by neurosurgery-he continues on Decadron as well which is probably contributing to his diabetes-.  #3 diabetes type 2-again likely secondary to Decadron use he is on low-dose Lantus as well as sliding scale insulin 3  times a day with meals at this point will monitor.--Yesterday sugars ran in the 2-300 range-however today morning sugar was 93-at noon was 183 and at 6 PM was 195-at this point will monitor-- since patient is now on by mouth intake will monitor CBGs before meals and at bedtime  #4-protein calorie malnutrition this is classified as severe-he is now eating by mouth he apparently has signed a waiver realizing there is an aspiration risk-continue to monitor weights and clinical status-recent albumin was 2.8.  #5-history COPD-this appears relatively stable he is on inhalers as well as when necessary nebulizers-I do note his CT scan did show a left upper lobe lung nodule-with suggestion for follow-up CT in approximately 3 months.  #6-GERD-at  this point appears relatively stable he is on a PPI.  #7 hypertension-at this point appears in fair control recent blood pressures 136/74 145/79-127/74--currently on no medication.  #8-history of seizure disorder-is on Keppra no recent seizures to my knowledge.  #9-history of chronic pain-again he does have when necessary morphine at this point appears fairly effective although patient will have complaints of pain at times-.  10 history of mood disorder-again he is on Seroquel for short-term this is followed by psychiatric services--he is also on clonazepam for anxiety  . Of note Will update labs including a CBC a metabolic panel next laboratory day-I do note some mild thrombocytopenia  [id number]-of note greater than 40 minutes spent assessing patient-reviewing his medical records-and coordinating and formulating a plan of care for numerous diagnoses-of note greater than 50% of time spent coordinating plan of care    .

## 2014-01-08 ENCOUNTER — Non-Acute Institutional Stay (SKILLED_NURSING_FACILITY): Payer: Medicaid Other | Admitting: Internal Medicine

## 2014-01-08 DIAGNOSIS — G060 Intracranial abscess and granuloma: Secondary | ICD-10-CM

## 2014-01-08 DIAGNOSIS — I2699 Other pulmonary embolism without acute cor pulmonale: Secondary | ICD-10-CM

## 2014-01-08 DIAGNOSIS — E119 Type 2 diabetes mellitus without complications: Secondary | ICD-10-CM

## 2014-01-09 LAB — GLUCOSE, CAPILLARY
GLUCOSE-CAPILLARY: 202 mg/dL — AB (ref 70–99)
GLUCOSE-CAPILLARY: 143 mg/dL — AB (ref 70–99)
Glucose-Capillary: 174 mg/dL — ABNORMAL HIGH (ref 70–99)

## 2014-01-09 NOTE — Progress Notes (Addendum)
Patient ID: [name], male   DOB: [birth year], 53 y.o.   MRN: [id number]               PROGRESS NOTE           DATE:  01/08/2014       FACILITY: [facility]     LEVEL OF CARE:   SNF  Acute Visit   CHIEF COMPLAINT:  Review, status post readmission to hospital from 01/01/2014 through 01/03/2014, and ?predischarge review.    HISTORY OF PRESENT ILLNESS:  [name] is a gentleman who came to [place] after a complicated admission to [facility] for predominantly right hemisphere abscesses which were microaerophilic strep per biopsy and aspirate.  He had been here receiving Rocephin and Flagyl.  These were IV, and the intent was to have this reviewed by Infectious Disease on 01/17/2014.  The original orders had been until 01/25/2014 or 01/26/2014.    He was up walking with a walker.    He was readmitted to hospital on 01/01/2014 with acute left anterior chest pain the evening before.  He was short of breath.  CT scan of the head revealed nonocclusive pulmonary embolism within the left main pulmonary artery, extending into the left upper lobes.   Internal cavitation in the right middle lobe was felt to be post pneumonic.  His Lovenox has been discontinued.    An issue in the facility is the patient's continued desire to smoke.  We have not made arrangements for people to have a safe place to smoke on the [place], even for people who are in need of long-term care.   The patient, therefore, wants to be discharged today.    The staff have already spoken to Infectious Disease and he is going to be discontinued from the Rocephin and Flagyl IV and transitioned to Flagyl orally as well as pen-V orally for another two months.  We will need to remove his sutures.  He will need his PEG tube removed at some point.    We are not even using this here.  Even though he failed two swallowing studies, he continued to wish to eat.  In fact, he is eating candy and cookies and things off other people's  trays.    With regards to the diabetes, he is on Levemir 5 U as well as a sliding scale.  He is not a patient who has been on insulin previously.  His 4:30 blood sugars are consistently above 200.  He is still on Decadron 1 mg twice a day.    Finally, he refused his last INR today.  This was last tested on 01/06/2014 at 2.29.  He is on Coumadin 4 mg a day.                 PHYSICAL EXAMINATION:   VITAL SIGNS:   O2 SATURATIONS:  95% on room air.   RESPIRATIONS:  24.   PULSE:  120.   GENERAL APPEARANCE:   The patient does not look in any distress.   CHEST/RESPIRATORY:  Reasonably clear air entry bilaterally.   CARDIOVASCULAR:  CARDIAC:  Heart sounds are tachy.  There are no gallops.  His JVP is not elevated.  He appears to be euvolemic.   GASTROINTESTINAL:  ABDOMEN:   Soft, nontender.  His PEG tube is still in place.   CIRCULATION:    EDEMA/VARICOSITIES:  Extremities:  He has no evidence of a DVT.   NEUROLOGICAL:    BALANCE/GAIT:  He cannot bring himself up from a chair although, once he is assisted, he can stand.  His gait is unstable, although he apparently does better with a walker.     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  The patient is apparently willing to forego the rest of his IV antibiotics.  If this continues to be the case, he will be discharged on pen-V and Flagyl for another two months.    Recent admission to hospital for a pulmonary embolism.   He has not had a follow-up INR.  This is actually the issue that concerns me the most at present.   I have not had an INR on him in two days.  His INR then was 2.29.    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  He pushes himself along in a wheelchair here and he apparently is able to walk with a walker.      Diabetes.  He is on Levemir.  This is another issue.  He is still on Decadron 1 mg b.i.d.  I do not think there is any chance of teaching this man to do  insulin.  His last hemoglobin A1c was 7.  His Decadron is due to stop on 01/11/2014.    This is not a safe discharge, although he apparently has a son that he is going to go home to live with.  I have taken one attempt to talk to him about the multiplicity of issues, some of which are life-threatening issues, that he might face.  He does not seem phased, states this is a jail.  Once again, this is largely a smoking issue.  I will have one more attempt to talk to him, hopefully, after his son arrives.    CPT CODE: 68341                                             ADDENDUM:     I have spoken with the patient and his son at some length.  The patient has agreed to complete his antibiotics with [place] intravenously.  This will give me a chance to stabilize his Coumadin dose, find the insulin need, remove his PEG tube.  There is no need to remove his PICC line as he will complete his antibiotics, at least according to what he has told me today.   I have increased his clonazepam. His meds will need to be changed to PO. He has failed 2 swallowing tests but is eating in spite of the clearly defined risks.

## 2014-01-10 ENCOUNTER — Ambulatory Visit (HOSPITAL_COMMUNITY)
Admit: 2014-01-10 | Discharge: 2014-01-10 | Disposition: A | Discharge status: Home / Self Care | Payer: Medicaid Other | Source: Skilled Nursing Facility | Attending: Internal Medicine | Admitting: Internal Medicine

## 2014-01-10 ENCOUNTER — Encounter (HOSPITAL_COMMUNITY): Payer: Self-pay | Admitting: Speech Pathology

## 2014-01-10 ENCOUNTER — Other Ambulatory Visit (HOSPITAL_COMMUNITY): Payer: Medicaid Other

## 2014-01-10 ENCOUNTER — Ambulatory Visit (HOSPITAL_COMMUNITY)
Admit: 2014-01-10 | Discharge: 2014-01-10 | Disposition: A | Discharge status: Home / Self Care | Payer: Medicaid Other | Source: Ambulatory Visit | Attending: Internal Medicine | Admitting: Internal Medicine

## 2014-01-10 DIAGNOSIS — R1314 Dysphagia, pharyngoesophageal phase: Secondary | ICD-10-CM | POA: Diagnosis not present

## 2014-01-10 DIAGNOSIS — R1310 Dysphagia, unspecified: Secondary | ICD-10-CM | POA: Diagnosis present

## 2014-01-10 DIAGNOSIS — Z5189 Encounter for other specified aftercare: Secondary | ICD-10-CM | POA: Insufficient documentation

## 2014-01-10 LAB — GLUCOSE, CAPILLARY
GLUCOSE-CAPILLARY: 152 mg/dL — AB (ref 70–99)
Glucose-Capillary: 162 mg/dL — ABNORMAL HIGH (ref 70–99)
Glucose-Capillary: 202 mg/dL — ABNORMAL HIGH (ref 70–99)
Glucose-Capillary: 266 mg/dL — ABNORMAL HIGH (ref 70–99)
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
## 2014-01-11 ENCOUNTER — Other Ambulatory Visit: Payer: Self-pay | Admitting: *Deleted

## 2014-01-11 LAB — GLUCOSE, CAPILLARY
GLUCOSE-CAPILLARY: 191 mg/dL — AB (ref 70–99)
Glucose-Capillary: 164 mg/dL — ABNORMAL HIGH (ref 70–99)

## 2014-01-11 MED ORDER — CLONAZEPAM 1 MG PO TABS: ORAL_TABLET | ORAL | Status: DC

## 2014-01-11 NOTE — Telephone Encounter (Signed)
[facility] 

## 2014-01-12 LAB — GLUCOSE, CAPILLARY
Glucose-Capillary: 120 mg/dL — ABNORMAL HIGH (ref 70–99)
Glucose-Capillary: 183 mg/dL — ABNORMAL HIGH (ref 70–99)

## 2014-01-13 ENCOUNTER — Non-Acute Institutional Stay (SKILLED_NURSING_FACILITY): Payer: Medicaid Other | Admitting: Internal Medicine

## 2014-01-13 DIAGNOSIS — Z7901 Long term (current) use of anticoagulants: Secondary | ICD-10-CM

## 2014-01-13 DIAGNOSIS — I2699 Other pulmonary embolism without acute cor pulmonale: Secondary | ICD-10-CM

## 2014-01-13 LAB — GLUCOSE, CAPILLARY
GLUCOSE-CAPILLARY: 176 mg/dL — AB (ref 70–99)
GLUCOSE-CAPILLARY: 163 mg/dL — AB (ref 70–99)
Glucose-Capillary: 173 mg/dL — ABNORMAL HIGH (ref 70–99)

## 2014-01-14 LAB — GLUCOSE, CAPILLARY
GLUCOSE-CAPILLARY: 318 mg/dL — AB (ref 70–99)
GLUCOSE-CAPILLARY: 184 mg/dL — AB (ref 70–99)

## 2014-01-15 ENCOUNTER — Encounter: Payer: Self-pay | Admitting: Internal Medicine

## 2014-01-15 ENCOUNTER — Non-Acute Institutional Stay (SKILLED_NURSING_FACILITY): Payer: Medicaid Other | Admitting: Internal Medicine

## 2014-01-15 ENCOUNTER — Inpatient Hospital Stay (HOSPITAL_COMMUNITY): Payer: Medicaid Other | Attending: Internal Medicine

## 2014-01-15 DIAGNOSIS — Z7901 Long term (current) use of anticoagulants: Secondary | ICD-10-CM | POA: Insufficient documentation

## 2014-01-15 DIAGNOSIS — G060 Intracranial abscess and granuloma: Secondary | ICD-10-CM

## 2014-01-15 DIAGNOSIS — E119 Type 2 diabetes mellitus without complications: Secondary | ICD-10-CM

## 2014-01-15 DIAGNOSIS — I2699 Other pulmonary embolism without acute cor pulmonale: Secondary | ICD-10-CM

## 2014-01-15 LAB — GLUCOSE, CAPILLARY
Glucose-Capillary: 158 mg/dL — ABNORMAL HIGH (ref 70–99)
Glucose-Capillary: 175 mg/dL — ABNORMAL HIGH (ref 70–99)
Glucose-Capillary: 98 mg/dL (ref 70–99)
Glucose-Capillary: 82 mg/dL (ref 70–99)

## 2014-01-15 NOTE — Progress Notes (Signed)
Patient ID: [name], male   DOB: [birth year], 54 y.o.   MRN: [id number]                  PROGRESS NOTE           DATE:   01/13/2013       FACILITY: [facility]     LEVEL OF CARE:   SNF  Acute Visit   CHIEF COMPLAINT:   Acute visit secondary to pulmonary embolism-on chronic anticoagulation.    HISTORY OF PRESENT ILLNESS:  Mr. Lipa is a gentleman who came to [place] after a complicated admission to [facility] for predominantly right hemisphere abscesses which were microaerophilic strep per biopsy and aspirate.  He had been here receiving Rocephin and Flagyl.  These were IV, and the intent was to have this reviewed by Infectious Disease on 01/17/2014.  The original orders had been until 01/25/2014 or 01/26/2014.  Marland Kitchen    He was readmitted to hospital on 01/01/2014 with acute left anterior chest pain the evening before.  He was short of breath.  CT scan of the head revealed nonocclusive pulmonary embolism within the left main pulmonary artery, extending into the left upper lobes.   Internal cavitation in the right middle lobe was felt to be post pneumonic.    his INR became therapeutic on Coumadn and Lovenox was discontinued.  However last week his INR did become somewhat supratherapeutic in the mid threes-Coumadin was held and INR continued to rise for short period after this-however he did normalize at 2.5 on December 31 and it was restarted apparently at 4 mg a day-however the  next day it was down to 1.63 Coumadin was increased to 5 mg a day-INR today actually is 1.23.  Speaking with nursing today there is some question whether patient received the initial 4 mg when Coumadin was restarted on December 31-apparently did receive 5 mg last night.  He continues to be at his baseline does not complain of increased chest pain or shortness of breath-he continues to go out frequently to smoke.  .     . Family medical social history is been reviewed most recently this summer 28  2015 note.  Medications have been reviewed per MAR.  Review of systems.  General is not complaining of fever or chills.  Respiratory does not complain of increased shortness of breath or cough.  Cardiac does not complaining of chest pain this evening.                   PHYSICAL EXAMINATION:   VITAL SIGNS:    Temperature 97.5 pulse is 106 respirations 20 blood pressure 124/73.   GENERAL APPEARANCE:   The patient does not look in any distress-comfortably in his wheelchair.   CHEST/RESPIRATORY:  Reasonably clear air entry bilaterally.--Some initial congestion that cleared with cough   CARDIOVASCULAR:  CARDIAC:  Heart sounds are tachy.  There are no gallops.  His JVP is not elevated.  He appears to be euvolemic.       CIRCULATION:    EDEMA/VARICOSITIES:  Extremities:  He has no evidence of a DVT.   NEUROLOGICAL:     His speech is clear do not appreciate any lateralizing findings continues to be weak--mainly ambulates in a wheelchair.   Labs.  INR today is 1.23-yesterday was 1.63-it was 2.5 on December 31-it was up to 3.41 on December 30.  01/09/2014.  WBC 8.2 hemoglobin 12.4 platelets 218.  Sodium 139 potassium 5.1 BUN 34  creatinine 0.63.      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    Recent admission to hospital for a pulmonary embolism.--INR is subtherapeutic-will restart Lovenox at previous dose twice a day-also is on Coumadin 5 mg a day-check INR in 2 days-continue Lovenox until INR is once again therapeutic range    Clinically patient appears to be stable   .   [id number].

## 2014-01-16 LAB — GLUCOSE, CAPILLARY
GLUCOSE-CAPILLARY: 150 mg/dL — AB (ref 70–99)
GLUCOSE-CAPILLARY: 172 mg/dL — AB (ref 70–99)
Glucose-Capillary: 69 mg/dL — ABNORMAL LOW (ref 70–99)
Glucose-Capillary: 165 mg/dL — ABNORMAL HIGH (ref 70–99)
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
## 2014-01-17 ENCOUNTER — Ambulatory Visit (INDEPENDENT_AMBULATORY_CARE_PROVIDER_SITE_OTHER): Payer: Medicaid Other | Admitting: Internal Medicine

## 2014-01-17 ENCOUNTER — Inpatient Hospital Stay (HOSPITAL_COMMUNITY): Admit: 2014-01-17 | Payer: Medicaid Other

## 2014-01-17 ENCOUNTER — Non-Acute Institutional Stay (SKILLED_NURSING_FACILITY): Payer: Medicaid Other | Admitting: Internal Medicine

## 2014-01-17 VITALS — BP 131/82 | Temp 98.0°F | Wt 133.0 lb

## 2014-01-17 DIAGNOSIS — E118 Type 2 diabetes mellitus with unspecified complications: Secondary | ICD-10-CM

## 2014-01-17 DIAGNOSIS — G060 Intracranial abscess and granuloma: Secondary | ICD-10-CM

## 2014-01-17 DIAGNOSIS — I2699 Other pulmonary embolism without acute cor pulmonale: Secondary | ICD-10-CM

## 2014-01-17 DIAGNOSIS — F411 Generalized anxiety disorder: Secondary | ICD-10-CM

## 2014-01-17 DIAGNOSIS — J42 Unspecified chronic bronchitis: Secondary | ICD-10-CM

## 2014-01-17 MED ORDER — METRONIDAZOLE 500 MG PO TABS: 500.0000 mg | ORAL_TABLET | Freq: Three times a day (TID) | ORAL | Status: DC

## 2014-01-17 MED ORDER — AMOXICILLIN 500 MG PO TABS: 500.0000 mg | ORAL_TABLET | Freq: Two times a day (BID) | ORAL | Status: DC

## 2014-01-17 MED ORDER — CIPROFLOXACIN HCL 750 MG PO TABS: 750.0000 mg | ORAL_TABLET | Freq: Two times a day (BID) | ORAL | Status: DC

## 2014-01-17 NOTE — Progress Notes (Signed)
Patient ID: [name], male   DOB: [birth year], 54 y.o.   MRN: [id number]   This is an acute visit.  Level of care skilled.  Facility [facility].  Chief complaint-acute visit secondary to discharge issues-.  History of present illness.  Patient is a 54 year old male with a history of a brain abscess.  He recently had a CT scan which showed improvement in the abscess.  He also has a history of dysphasia and he has been transitioned to an oral diet at his insistence.  He had previously been using the PEG tube which is still in place.  This is complicated with a recent pulmonary embolism currently he is on Coumadin 3 mg a day and INR is actually therapeutic today at 2.55 he had been on Lovenox as well as previously.  He is also a type II diabetic his steroid Decadron has been stopped-blood sugars have been somewhat variable ranging from 71 this morning 69 yesterday to mid 100s several days ago in the morning.  Later in the day at noon appears to run more in the mid 100s.  At 6 PM there is more variability usually from the mid 100s occasionally spike above  He is on Lantus 5 units daily.  Patient also has chronic pain he is on morphine solution 10 mg every 3 hours when necessary   Patient did see infectious disease physician today who has [place] his IV antibiotics and placed him on oral Flagyl amoxicillin as well as Cipro.  Patient is quite determined to go home this afternoon-plans were that he would be with his brother-his son is here to take him there.  I did discuss this extensively with Dr. Dellia Nims via phone-Dr. Dellia Nims actually did see him 2 days ago as well as-at that point he was concerned about making a smooth transition to discharge status-including transition him to by mouth medications and switching him to a by mouth medication for his diabetes --with the goal to make his home regimen as simple as possible.  Dr. Dellia Nims is still very concerned about a safe discharge  here and does not feel discharging him this afternoon would be in patient's best interest with his multiple medical issues on top of the fact that he has a recent pulmonary embolism-he is especially concerned about his diabetic management. As well as maintenance of the PEG tube.  I did discuss this with patient but he is quite adamant he is going home irregardless-I did state with a recent pulmonary embolism this could be life-threatening without proper follow-up-however this did not change his mind   Family medical social history has been reviewed per history and physical on 01/01/2014.  Medications have been reviewed per MAR--I note he is on Lantus 5 units daily at bedtime-Coumadin 3 mg every afternoon.  Also on Keppra 500 mg every 12 hours-in addition to Klonopin 0.5 mg 3 times a day when necessary and twice a day routine.  Regards pain management he is on morphine solution 10 mg per 5 mL-10 mg every 3 hours for severe pain--Dr. Dellia Nims  would really like to titrate this down before any discharge  Review of systems-this is quite limited secondary to patient being quite anxious and somewhat agitated-he is not complaining of any chest pain or shortness of breath or acute pain at this time although he has frequent pain complaints.  Physical exam.  . He has been afebrile-pulses are variable from the 60s to low 100s I got 110 on exam tonight he  is quite excited-respirations 20 recent blood pressures 138/56-- 124/73  In general this is a frail middle-aged male in no acute distress-but when made aware that physician recommended he not be discharged today became quite agitated and adamant he was going home.  His skin is warm and dry.  Eyes pupils appear reactive to light visual acuity appears grossly intact.  Oropharynx is clear mucous membranes moist- has numerous extractions.  Heart is tachycardic this is somewhat chronic without murmur gallop or rub he does not have significant lower  extremity edema.  Chest he does have some bronchial sounds there is no labored breathing.  Abdomen is soft nontender positive bowel sounds PEG site appears unremarkable without drainage bleeding or surrounding erythema.  Musculoskeletal-moves all extremities 4 apparently he is up using a walker at times currently ambulating in a wheelchair but appears stronger than when he first arrived.  Neurologic is grossly intact he again is quite agitated once discussion about him staying was initiated.  Labs.  INR today is 2.55.  Yesterday was 2.42.  01/09/2014.  WBC 8.2 hemoglobin 12.4 platelets 218.  Sodium 139 potassium 5.1 BUN 34 creatinine 0.63.  Assessment and plan.  #1-discharge issues-as noted above this has become quite challenging-patient is quite adamant here regardless that he is leaving today-I did advise him of the risks including recent pulmonary embolism which could lead to life-threatening situation-nonetheless he is adamant he is going home.  This is complicated with a history of diabetes which appears to be relatively well controlled however the goal is to get him on an oral medication and monitor for little while before discharge to ensure stability-however patient does not really want to hear any of that.  In regards to history of brain abscess again he has been switched to oral antibiotics as noted above-clinically this appears to be stable.  History seizure disorder he does continue on Keppra.--this has been stable  History of anxiety he is on Klonopin.  Again this is a very difficult situation it would be in patient's best interest to stay so we can monitor his Coumadin-and diabetes for a while  Among other issues before discharge however patient again is quite adamant and is actually about to head out the door  He does have a neurology consult tomorrow-he does plan to go to that--his son is with him and apparently will be going with him tomorrow as  well.  Nonetheless patient will be leaving AGAINST MEDICAL ADVICE    This was discussed with Dr. Dellia Nims via phone.  [id number]--[id number] note greater than 30 minutes spent on this discharge summary-  .

## 2014-01-17 NOTE — Progress Notes (Signed)
Per Dr Baxter Flattery 40 cm  Single lumen Peripherally Inserted Central Catheter  removed from right basilic . No sutures present. Dressing was clean and dry . Area cleansed with chlorhexidine and petroleum dressing applied. Pt advised no heavy lifting with this arm, leave dressing for 24 hours and call the office if dressing becomes soaked with blood or sharp pain presents.  Pt tolerated procedure well.    Laverle Patter, RN

## 2014-01-18 ENCOUNTER — Encounter: Payer: Self-pay | Admitting: Internal Medicine

## 2014-01-18 ENCOUNTER — Telehealth: Payer: Self-pay | Admitting: *Deleted

## 2014-01-18 ENCOUNTER — Ambulatory Visit (INDEPENDENT_AMBULATORY_CARE_PROVIDER_SITE_OTHER): Payer: Medicaid Other | Admitting: Internal Medicine

## 2014-01-18 ENCOUNTER — Inpatient Hospital Stay: Payer: Medicaid Other | Admitting: Internal Medicine

## 2014-01-18 VITALS — BP 83/56 | HR 120 | Temp 98.1°F | Ht 73.0 in | Wt 132.6 lb

## 2014-01-18 DIAGNOSIS — R1310 Dysphagia, unspecified: Secondary | ICD-10-CM

## 2014-01-18 DIAGNOSIS — G060 Intracranial abscess and granuloma: Secondary | ICD-10-CM

## 2014-01-18 DIAGNOSIS — Z7901 Long term (current) use of anticoagulants: Secondary | ICD-10-CM

## 2014-01-18 DIAGNOSIS — R911 Solitary pulmonary nodule: Secondary | ICD-10-CM | POA: Insufficient documentation

## 2014-01-18 DIAGNOSIS — I2699 Other pulmonary embolism without acute cor pulmonale: Secondary | ICD-10-CM

## 2014-01-18 DIAGNOSIS — F411 Generalized anxiety disorder: Secondary | ICD-10-CM

## 2014-01-18 DIAGNOSIS — M5126 Other intervertebral disc displacement, lumbar region: Secondary | ICD-10-CM

## 2014-01-18 DIAGNOSIS — IMO0001 Reserved for inherently not codable concepts without codable children: Secondary | ICD-10-CM

## 2014-01-18 DIAGNOSIS — E118 Type 2 diabetes mellitus with unspecified complications: Secondary | ICD-10-CM

## 2014-01-18 DIAGNOSIS — F419 Anxiety disorder, unspecified: Secondary | ICD-10-CM

## 2014-01-18 LAB — AFB CULTURE WITH SMEAR (NOT AT ARMC)
ACID FAST SMEAR: NONE SEEN
ACID FAST SMEAR: NONE SEEN
Acid Fast Smear: NONE SEEN

## 2014-01-18 LAB — GLUCOSE, CAPILLARY
GLUCOSE-CAPILLARY: 71 mg/dL (ref 70–99)
GLUCOSE-CAPILLARY: 176 mg/dL — AB (ref 70–99)
Glucose-Capillary: 132 mg/dL — ABNORMAL HIGH (ref 70–99)

## 2014-01-18 LAB — POCT INR: INR: 3

## 2014-01-18 MED ORDER — HYDROXYZINE HCL 25 MG PO TABS: 25.0000 mg | ORAL_TABLET | Freq: Two times a day (BID) | ORAL | Status: DC | PRN

## 2014-01-18 MED ORDER — WARFARIN SODIUM 3 MG PO TABS: 3.0000 mg | ORAL_TABLET | Freq: Every day | ORAL | Status: DC

## 2014-01-18 MED ORDER — ACETAMINOPHEN-CODEINE #3 300-30 MG PO TABS: 1.0000 | ORAL_TABLET | Freq: Three times a day (TID) | ORAL | Status: DC | PRN

## 2014-01-18 MED ORDER — PAROXETINE HCL 20 MG PO TABS: 20.0000 mg | ORAL_TABLET | Freq: Every day | ORAL | Status: DC

## 2014-01-18 MED ORDER — NICOTINE 7 MG/24HR TD PT24: 7.0000 mg | MEDICATED_PATCH | Freq: Every day | TRANSDERMAL | Status: DC

## 2014-01-18 MED ORDER — NICOTINE 14 MG/24HR TD PT24
14.0000 mg | MEDICATED_PATCH | Freq: Every day | TRANSDERMAL | Status: DC
Start: 2014-01-18 — End: 2014-07-25

## 2014-01-18 MED ORDER — CLONAZEPAM 1 MG PO TABS: ORAL_TABLET | ORAL | Status: DC

## 2014-01-18 MED ORDER — FENOFIBRATE 54 MG PO TABS: 54.0000 mg | ORAL_TABLET | Freq: Every day | ORAL | Status: DC

## 2014-01-18 NOTE — Patient Instructions (Signed)
General Instructions: Please take medications as instructed on this paper.  Please keep your follow up appointments.  Thank you for bringing your medicines today. This helps [place] keep you safe from mistakes.   Progress Toward Treatment Goals:  Treatment Goal 09/26/2012  Hemoglobin A1C at goal  Blood pressure at goal  Stop smoking smoking less  Prevent falls (No Data)    Self Care Goals & Plans:  Self Care Goal 01/18/2014  Manage my medications take my medicines as prescribed; bring my medications to every visit; refill my medications on time  Monitor my health keep track of my blood glucose; bring my glucose meter and log to each visit  Eat healthy foods drink diet soda or water instead of juice or soda; eat more vegetables; eat foods that are low in salt; eat baked foods instead of fried foods  Be physically active -  Stop smoking -  Meeting treatment goals -    Home Blood Glucose Monitoring 09/26/2012  Check my blood sugar no home glucose monitoring  When to check my blood sugar N/A     Care Management & Community Referrals:  Referral 09/26/2012  Referrals made for care management support none needed  Referrals made to community resources none
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
## 2014-01-18 NOTE — Telephone Encounter (Signed)
Pt calls and leaves message that he has been disch from nsg home, that he does not have coumadin and next dose is today. Also states he needs a hosp bed. Spoke to [place], medicaid will need face to face for hosp bed, there will be no need of PT orders, they will only pay for skilled [facility] visits

## 2014-01-19 ENCOUNTER — Ambulatory Visit: Payer: Medicaid Other | Admitting: Internal Medicine

## 2014-01-19 MED ORDER — METFORMIN HCL 500 MG PO TABS: 500.0000 mg | ORAL_TABLET | Freq: Two times a day (BID) | ORAL | Status: DC

## 2014-01-19 NOTE — Assessment & Plan Note (Signed)
INR 3.0 today Will have him back to see Dr. Clydia Llano to be first provoked PE so A/C should be for 3 months. - Continue Warfarin 3mg  daily.

## 2014-01-19 NOTE — Assessment & Plan Note (Addendum)
-   Will need to continue to follow with Dr. Baxter Flattery - Amoxicillin, Metronidazole, and Cipro - Will need dental work done but is now on coumadin for his PE. - He is on Keppra apparently for seizure propholaxis, will continue this until he follows up with NeuroSx.

## 2014-01-19 NOTE — Assessment & Plan Note (Addendum)
Patient still has feeding tube in place. He apparently is swallowing his pills fine and eating a full meal.  He was able to drink a cup of water here today provided by nursing staff. I feel the feeding tube can likely be removed, but I am going to check with Dr. Baxter Flattery first as the patient states she wanted it kept in.  He requests a hospital bed but I do not see the indication for one, if he is still having tube feeds and at risk for aspiration then that would be an indication but he is not having tube feeds.   -Addendum: Discussed with Dr. Baxter Flattery, would recommend consulting IR for removal if it stays will need to continue to flush it. Placed IR consult

## 2014-01-19 NOTE — Assessment & Plan Note (Signed)
He has been on Sq insulin in the SNF. Will resume Metformin 500mg  BID

## 2014-01-19 NOTE — Assessment & Plan Note (Signed)
He has been receiving IV narcotics in the SNF but now has left AMA. Given his extensive history of drug abuse and recent IV heroine abuse this is very concerning. We will Rx Tylenol #3 for his pain 1-2 Q8PRN #120 for the month. Would like to eventually wean this off too if possible.

## 2014-01-19 NOTE — Assessment & Plan Note (Signed)
CT Chest 11/20 shows 3.6 cm nodule in LUL, 1 month later CT angio did not show this nodule.  - Recommend follow up with CT chest in 1 year (Disscussed this with dr. Eppie Gibson)

## 2014-01-19 NOTE — Assessment & Plan Note (Signed)
Has been receiving klonopin regularly, would plan to taper but I see he has not been on his SSRI through this hospitalization.   -Will continue Klonopin 1mg  TID for the next month with plan to wean off next month. - WIll resume Paxil 20mg  daily

## 2014-01-19 NOTE — Assessment & Plan Note (Signed)
-   On warfarin, 98% O2 sat on room air. - Check INR >>3.0 will continue at 3mg  daily, given patient is on multiple medications that can affect his INR will need close follow up of this.  I have asked that he come back on Monday to have it rechecked and meet Dr. Elie Confer.

## 2014-01-22 ENCOUNTER — Ambulatory Visit: Payer: Self-pay

## 2014-01-22 ENCOUNTER — Ambulatory Visit (INDEPENDENT_AMBULATORY_CARE_PROVIDER_SITE_OTHER): Payer: Medicaid Other | Admitting: Pharmacist

## 2014-01-22 ENCOUNTER — Telehealth: Payer: Self-pay | Admitting: *Deleted

## 2014-01-22 DIAGNOSIS — I2699 Other pulmonary embolism without acute cor pulmonale: Secondary | ICD-10-CM

## 2014-01-22 DIAGNOSIS — Z7901 Long term (current) use of anticoagulants: Secondary | ICD-10-CM

## 2014-01-22 LAB — POCT INR: INR: 2.5

## 2014-01-22 NOTE — Telephone Encounter (Signed)
Received PA request from pt's pharmacy for his fenofibrate- medication is non-preferred.   Trial and failure of preferred agent (Gemfibrozil) is required unless otherwise indicated.  Will place form in pcp's box for completion.Regenia Skeeter, Avi Archuleta Cassady1/11/201610:17 AM

## 2014-01-22 NOTE — Addendum Note (Signed)
Addended by: Joni Reining C on: 01/22/2014 11:59 AM   Modules accepted: Orders

## 2014-01-22 NOTE — Progress Notes (Signed)
Case discussed with Dr. Heber Kongiganak at time of visit. We reviewed the resident's history and exam and pertinent patient test results. I agree with the assessment, diagnosis, and plan of care documented in the resident's note.  Upon personal review of the CT scans of the thorax from 11/2013 and 12/2013 I noted a left apical scar but no other 3.6 cm masses in the LUL.  The left apical scar is relatively unchanged on the 12/2013 CT scan and it is not the "well defined nodule" as described in the initial CT read.  It is therefore my opinion that there is no need for CT follow-up as there is no LUL 3.6 cm mass on the most recent CT scan in which to follow-up.  Of note, he did have cavitation in the RML (thin walled) which developed over the interval and was either related to a necrotizing process, or more likely, a pulmonary infarction from the pulmonary embolism.

## 2014-01-23 ENCOUNTER — Telehealth: Payer: Self-pay | Admitting: Licensed Clinical Social Worker

## 2014-01-23 ENCOUNTER — Encounter (HOSPITAL_COMMUNITY): Payer: Self-pay | Admitting: Emergency Medicine

## 2014-01-23 ENCOUNTER — Emergency Department (HOSPITAL_COMMUNITY)
Admission: EM | Admit: 2014-01-23 | Discharge: 2014-01-23 | Disposition: A | Discharge status: Home / Self Care | Payer: Medicaid Other | Attending: Emergency Medicine | Admitting: Emergency Medicine

## 2014-01-23 DIAGNOSIS — Z8673 Personal history of transient ischemic attack (TIA), and cerebral infarction without residual deficits: Secondary | ICD-10-CM | POA: Diagnosis not present

## 2014-01-23 DIAGNOSIS — F419 Anxiety disorder, unspecified: Secondary | ICD-10-CM | POA: Insufficient documentation

## 2014-01-23 DIAGNOSIS — Z86711 Personal history of pulmonary embolism: Secondary | ICD-10-CM | POA: Diagnosis not present

## 2014-01-23 DIAGNOSIS — Z792 Long term (current) use of antibiotics: Secondary | ICD-10-CM | POA: Insufficient documentation

## 2014-01-23 DIAGNOSIS — Z8611 Personal history of tuberculosis: Secondary | ICD-10-CM | POA: Insufficient documentation

## 2014-01-23 DIAGNOSIS — J449 Chronic obstructive pulmonary disease, unspecified: Secondary | ICD-10-CM | POA: Diagnosis not present

## 2014-01-23 DIAGNOSIS — Z8781 Personal history of (healed) traumatic fracture: Secondary | ICD-10-CM | POA: Insufficient documentation

## 2014-01-23 DIAGNOSIS — M629 Disorder of muscle, unspecified: Secondary | ICD-10-CM | POA: Diagnosis not present

## 2014-01-23 DIAGNOSIS — Z720 Tobacco use: Secondary | ICD-10-CM | POA: Diagnosis not present

## 2014-01-23 DIAGNOSIS — Z7901 Long term (current) use of anticoagulants: Secondary | ICD-10-CM | POA: Insufficient documentation

## 2014-01-23 DIAGNOSIS — Z7951 Long term (current) use of inhaled steroids: Secondary | ICD-10-CM | POA: Diagnosis not present

## 2014-01-23 DIAGNOSIS — Z95810 Presence of automatic (implantable) cardiac defibrillator: Secondary | ICD-10-CM | POA: Insufficient documentation

## 2014-01-23 DIAGNOSIS — Z87448 Personal history of other diseases of urinary system: Secondary | ICD-10-CM | POA: Diagnosis not present

## 2014-01-23 DIAGNOSIS — Z951 Presence of aortocoronary bypass graft: Secondary | ICD-10-CM | POA: Insufficient documentation

## 2014-01-23 DIAGNOSIS — E119 Type 2 diabetes mellitus without complications: Secondary | ICD-10-CM | POA: Diagnosis not present

## 2014-01-23 DIAGNOSIS — Z742 Need for assistance at home and no other household member able to render care: Secondary | ICD-10-CM

## 2014-01-23 DIAGNOSIS — Z8614 Personal history of Methicillin resistant Staphylococcus aureus infection: Secondary | ICD-10-CM | POA: Insufficient documentation

## 2014-01-23 DIAGNOSIS — E785 Hyperlipidemia, unspecified: Secondary | ICD-10-CM | POA: Diagnosis not present

## 2014-01-23 DIAGNOSIS — K219 Gastro-esophageal reflux disease without esophagitis: Secondary | ICD-10-CM | POA: Insufficient documentation

## 2014-01-23 DIAGNOSIS — Z8619 Personal history of other infectious and parasitic diseases: Secondary | ICD-10-CM | POA: Insufficient documentation

## 2014-01-23 DIAGNOSIS — G060 Intracranial abscess and granuloma: Secondary | ICD-10-CM | POA: Insufficient documentation

## 2014-01-23 DIAGNOSIS — G894 Chronic pain syndrome: Secondary | ICD-10-CM | POA: Diagnosis not present

## 2014-01-23 DIAGNOSIS — Z79899 Other long term (current) drug therapy: Secondary | ICD-10-CM | POA: Diagnosis not present

## 2014-01-23 DIAGNOSIS — I10 Essential (primary) hypertension: Secondary | ICD-10-CM | POA: Insufficient documentation

## 2014-01-23 NOTE — Progress Notes (Signed)
Anti-Coagulation Progress Note  [name] is a 54 y.o. male who is currently on an anti-coagulation regimen.    RECENT RESULTS: Recent results are below, the most recent result is correlated with a dose of 21 mg. per week: Lab Results  Component Value Date   INR 2.50 01/22/2014   INR 3.0 01/18/2014   INR 0.99 01/03/2014    ANTI-COAG DOSE: Anticoagulation Dose Instructions as of 01/22/2014      Dorene Grebe Tue Wed Thu Fri Sat   New Dose 3 mg 3 mg 3 mg 3 mg 3 mg 3 mg 3 mg    Description        Will give 3mg  PO warfarin for Monday/Tuesday/Wednesday/Thursday; On Friday 15-JAN-16 at 0930h will re-check INR.        ANTICOAG SUMMARY: Anticoagulation Episode Summary    Current INR goal 2.0-3.0  Next INR check 01/26/2014  INR from last check 2.50 (01/22/2014)  Weekly max dose   Target end date   INR check location   Preferred lab   Send INR reminders to    Indications  Acute pulmonary embolism [I26.99] Chronic anticoagulation [Z79.01]        Comments Diagnosed in late 2015; spent some time in a local rehab facility. New to our anticoagulation managment clinic as of 11-JAN-16.         ANTICOAG TODAY: Anticoagulation Summary as of 01/22/2014    INR goal 2.0-3.0  Selected INR 2.50 (01/22/2014)  Next INR check 01/26/2014  Target end date    Indications  Acute pulmonary embolism [I26.99] Chronic anticoagulation [Z79.01]      Anticoagulation Episode Summary    INR check location    Preferred lab    Send INR reminders to    Comments Diagnosed in late 2015; spent some time in a local rehab facility. New to our anticoagulation managment clinic as of 11-JAN-16.       PATIENT INSTRUCTIONS: Patient Instructions  Patient instructed to take medications as defined in the Anti-coagulation Track section of this encounter.  Patient instructed to take today's dose.  Patient verbalized understanding of these instructions.       FOLLOW-UP Return in 4 days (on 01/26/2014) for  Follow up INR at 0930h.  Jorene Guest, III Pharm.D., CACP

## 2014-01-23 NOTE — ED Notes (Signed)
Bed: WA10 Expected date: 01/23/14 Expected time: 7:06 AM Means of arrival: Ambulance Comments: Unhappy at home

## 2014-01-23 NOTE — ED Notes (Signed)
Cleaned patient from feces. Patients clothing removed. Clothing soiled and placed in patient belongings bag then placed at beside. Patient given clean sheets and warm blanket.
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
## 2014-01-23 NOTE — Discharge Instructions (Signed)
As discussed, your evaluation today has been largely reassuring.  But, it is important that you monitor your condition carefully, and do not hesitate to return to the ED if you develop new, or concerning changes in your condition. ? ?Otherwise, please follow-up with your physician for appropriate ongoing care. ? ?

## 2014-01-23 NOTE — Progress Notes (Signed)
54 yr old medicaid [place] access [employer] pt brought in [place] ED soiled with stool  Pt confirms left AMA from [facility] center in [place] and does not want to return to any snf.  Preference is d/c home with brother "Carloyn Manner" who he states he "don't have any trouble with Carloyn Manner"  Pt [facility] "They have seen me before."  States he had walker, wheelchair and bedside commode from them previously.  Denies need of equipment.  Left message for Cyril Mourning of [facility] [phone]) at [place], Vanita Panda, RN, Lanelle Bal.  Pt states his clothes are soiled he wore in to ED.  Pt states he is not ambulatory

## 2014-01-23 NOTE — ED Notes (Signed)
On interview with patient, patient states he is in ED because he soiled himself and needs help cleaning himself up. Pt denies any other needs today, states once he has been cleaned he is ready to go home. Pt denies wanting to be placed elsewhere, states he may return to his current living situation once he is clean.

## 2014-01-23 NOTE — Patient Instructions (Signed)
Patient instructed to take medications as defined in the Anti-coagulation Track section of this encounter.  Patient instructed to take today's dose.  Patient verbalized understanding of these instructions.    

## 2014-01-23 NOTE — Telephone Encounter (Signed)
[name] was referred to CSW as pt mentioned difficulties with transportation.  Upon EMR review, pt recently d/c from ED.  Pt went to ED as his w/c would not fit in the bathroom and pt was unable to get to the toilet independently.  ED has referred for [facility] services.  CSW placed call to [name] to discuss PCS services for a short duration, in addition to available medical transportation.  CSW placed called to pt.  CSW left message requesting return call. CSW provided contact hours and phone number.

## 2014-01-23 NOTE — Progress Notes (Signed)
Clinical Social Work  CSW spoke with MD who reports patient plans to return home but needs [facility] services. CSW made CM aware of needs. CSW is signing off but available if needed.  Sindy Messing, LCSW (Coverage for [employer])

## 2014-01-24 NOTE — Telephone Encounter (Signed)
Pt inquired about Mobile Meals.  At this time, pt notified [employer] provides to those 27 and up.  Mr. Liby is too young.  PCS form completed and faxed to [facility] today.

## 2014-01-24 NOTE — Telephone Encounter (Signed)
[name] returned call to [name].  Pt states he is in need of aide services.  CSW informed [name] will initiate PCS form and forward to physician.  Pt inquiring about his [facility] RN that was supposed to come out this morning.  CSW provided [name] with the number to [facility] and informed pt still one more hour for morning hours.

## 2014-01-25 ENCOUNTER — Encounter (HOSPITAL_COMMUNITY): Payer: Self-pay | Admitting: Orthopedic Surgery

## 2014-01-25 ENCOUNTER — Telehealth: Payer: Self-pay | Admitting: Internal Medicine

## 2014-01-25 MED ORDER — LEVETIRACETAM 500 MG PO TABS: 500.0000 mg | ORAL_TABLET | Freq: Two times a day (BID) | ORAL | Status: DC

## 2014-01-25 MED ORDER — GEMFIBROZIL 600 MG PO TABS: 600.0000 mg | ORAL_TABLET | Freq: Two times a day (BID) | ORAL | Status: DC

## 2014-01-25 NOTE — Telephone Encounter (Signed)
I called Mr Merica regarding his medications at 8:45 pm. I received a prior authorization form regarding his fenofibrate that said he must try a trial of gemfibrozil prior to authorization. I explained this to Mr Schear who said he has not taken gemfibrozil in the past (not in EMR either) and instructed him to take gemfibrozil 600 mg bid thirty minutes before breakfast and dinner. He said he understood. He also asked if I can switch his keppra to tablet as the liquid makes him nauseous and vomit. I told him I would send po tab form of keppra to pharmacy as well. He had no further questions.  Lottie Mussel, MD 01/25/14 8:56 pm

## 2014-01-26 ENCOUNTER — Telehealth: Payer: Self-pay | Admitting: *Deleted

## 2014-01-26 ENCOUNTER — Telehealth: Payer: Self-pay | Admitting: Licensed Clinical Social Worker

## 2014-01-26 DIAGNOSIS — I2699 Other pulmonary embolism without acute cor pulmonale: Secondary | ICD-10-CM

## 2014-01-26 DIAGNOSIS — Z7901 Long term (current) use of anticoagulants: Secondary | ICD-10-CM

## 2014-01-26 NOTE — Telephone Encounter (Signed)
[facility] calls and wants to know if pt should be taking gemfibrozil with coumadin, please advise

## 2014-01-26 NOTE — Telephone Encounter (Signed)
I was told he had to try gemfibrozil due to prior authorization issues. If he is still coming for his regularly scheduled INR checks, he can try the gemfibrozil and see how if it affects his INR

## 2014-01-26 NOTE — Telephone Encounter (Signed)
CSW received message from [name] as to when his transportation will arrive.  CSW unaware [name] transportation needs for today's Coumadin Clinic.  CSW spoke with pt earlier this week but unaware of transportation needs.  Received call from Dr. Elie Confer of urgent need to have [name] INR checked.  Pt is current with [facility] for [facility] RN services, will check to see if [facility] RN can provide service.  CSW placed call to [facility] and spoke with Santiago Glad, [facility] RN for [facility].  Pt was not scheduled for [facility] RN today, due to his scheduled appointment.  Santiago Glad made aware of current issue.  RN notified of request to have [facility] RN to check INR and contact Dr. Elie Confer at [phone] while [facility] RN is there with [name].  Due to afternoon time frame, [facility] may not be able to check today but if not will have [facility] RN go on 01/27/2014 and contact Dr. Elie Confer.   Alta Corning RN will contact [name] and notify.

## 2014-01-27 ENCOUNTER — Telehealth: Payer: Self-pay | Admitting: Pharmacist

## 2014-01-27 ENCOUNTER — Encounter (HOSPITAL_COMMUNITY): Payer: Self-pay | Admitting: *Deleted

## 2014-01-27 ENCOUNTER — Inpatient Hospital Stay (HOSPITAL_COMMUNITY)
Admission: EM | Admit: 2014-01-27 | Discharge: 2014-01-29 | DRG: 193 | Discharge status: Against Medical Advice | Payer: Medicaid Other | Attending: Internal Medicine | Admitting: Internal Medicine

## 2014-01-27 ENCOUNTER — Emergency Department (HOSPITAL_COMMUNITY): Payer: Medicaid Other

## 2014-01-27 DIAGNOSIS — G894 Chronic pain syndrome: Secondary | ICD-10-CM | POA: Diagnosis present

## 2014-01-27 DIAGNOSIS — I10 Essential (primary) hypertension: Secondary | ICD-10-CM | POA: Diagnosis present

## 2014-01-27 DIAGNOSIS — Z96662 Presence of left artificial ankle joint: Secondary | ICD-10-CM | POA: Diagnosis present

## 2014-01-27 DIAGNOSIS — Z888 Allergy status to other drugs, medicaments and biological substances status: Secondary | ICD-10-CM

## 2014-01-27 DIAGNOSIS — Z79899 Other long term (current) drug therapy: Secondary | ICD-10-CM

## 2014-01-27 DIAGNOSIS — Z7901 Long term (current) use of anticoagulants: Secondary | ICD-10-CM | POA: Diagnosis not present

## 2014-01-27 DIAGNOSIS — I739 Peripheral vascular disease, unspecified: Secondary | ICD-10-CM | POA: Diagnosis present

## 2014-01-27 DIAGNOSIS — N529 Male erectile dysfunction, unspecified: Secondary | ICD-10-CM | POA: Diagnosis present

## 2014-01-27 DIAGNOSIS — R079 Chest pain, unspecified: Secondary | ICD-10-CM | POA: Diagnosis present

## 2014-01-27 DIAGNOSIS — K088 Other specified disorders of teeth and supporting structures: Secondary | ICD-10-CM | POA: Diagnosis present

## 2014-01-27 DIAGNOSIS — Z95810 Presence of automatic (implantable) cardiac defibrillator: Secondary | ICD-10-CM

## 2014-01-27 DIAGNOSIS — B1920 Unspecified viral hepatitis C without hepatic coma: Secondary | ICD-10-CM | POA: Diagnosis present

## 2014-01-27 DIAGNOSIS — F410 Panic disorder [episodic paroxysmal anxiety] without agoraphobia: Secondary | ICD-10-CM | POA: Diagnosis present

## 2014-01-27 DIAGNOSIS — E785 Hyperlipidemia, unspecified: Secondary | ICD-10-CM | POA: Diagnosis present

## 2014-01-27 DIAGNOSIS — J449 Chronic obstructive pulmonary disease, unspecified: Secondary | ICD-10-CM | POA: Diagnosis present

## 2014-01-27 DIAGNOSIS — F17210 Nicotine dependence, cigarettes, uncomplicated: Secondary | ICD-10-CM | POA: Diagnosis present

## 2014-01-27 DIAGNOSIS — M5116 Intervertebral disc disorders with radiculopathy, lumbar region: Secondary | ICD-10-CM | POA: Diagnosis present

## 2014-01-27 DIAGNOSIS — I720 Aneurysm of carotid artery: Secondary | ICD-10-CM | POA: Diagnosis present

## 2014-01-27 DIAGNOSIS — Z91013 Allergy to seafood: Secondary | ICD-10-CM

## 2014-01-27 DIAGNOSIS — E119 Type 2 diabetes mellitus without complications: Secondary | ICD-10-CM | POA: Diagnosis present

## 2014-01-27 DIAGNOSIS — R0602 Shortness of breath: Secondary | ICD-10-CM

## 2014-01-27 DIAGNOSIS — Y95 Nosocomial condition: Secondary | ICD-10-CM | POA: Diagnosis present

## 2014-01-27 DIAGNOSIS — Z8673 Personal history of transient ischemic attack (TIA), and cerebral infarction without residual deficits: Secondary | ICD-10-CM | POA: Diagnosis not present

## 2014-01-27 DIAGNOSIS — Z86711 Personal history of pulmonary embolism: Secondary | ICD-10-CM

## 2014-01-27 DIAGNOSIS — Z201 Contact with and (suspected) exposure to tuberculosis: Secondary | ICD-10-CM | POA: Diagnosis present

## 2014-01-27 DIAGNOSIS — I6523 Occlusion and stenosis of bilateral carotid arteries: Secondary | ICD-10-CM | POA: Diagnosis present

## 2014-01-27 DIAGNOSIS — J189 Pneumonia, unspecified organism: Secondary | ICD-10-CM | POA: Diagnosis not present

## 2014-01-27 DIAGNOSIS — Z8614 Personal history of Methicillin resistant Staphylococcus aureus infection: Secondary | ICD-10-CM

## 2014-01-27 DIAGNOSIS — G4730 Sleep apnea, unspecified: Secondary | ICD-10-CM | POA: Diagnosis present

## 2014-01-27 DIAGNOSIS — K219 Gastro-esophageal reflux disease without esophagitis: Secondary | ICD-10-CM | POA: Diagnosis present

## 2014-01-27 DIAGNOSIS — Z885 Allergy status to narcotic agent status: Secondary | ICD-10-CM | POA: Diagnosis not present

## 2014-01-27 DIAGNOSIS — Z9889 Other specified postprocedural states: Secondary | ICD-10-CM | POA: Diagnosis not present

## 2014-01-27 DIAGNOSIS — R911 Solitary pulmonary nodule: Secondary | ICD-10-CM | POA: Diagnosis present

## 2014-01-27 DIAGNOSIS — G060 Intracranial abscess and granuloma: Secondary | ICD-10-CM

## 2014-01-27 DIAGNOSIS — F419 Anxiety disorder, unspecified: Secondary | ICD-10-CM

## 2014-01-27 DIAGNOSIS — I5032 Chronic diastolic (congestive) heart failure: Secondary | ICD-10-CM | POA: Diagnosis present

## 2014-01-27 DIAGNOSIS — Z720 Tobacco use: Secondary | ICD-10-CM

## 2014-01-27 LAB — CBC
HCT: 40.7 % (ref 39.0–52.0)
Hemoglobin: 13.8 g/dL (ref 13.0–17.0)
MCH: 28.3 pg (ref 26.0–34.0)
MCHC: 33.9 g/dL (ref 30.0–36.0)
MCV: 83.6 fL (ref 78.0–100.0)
PLATELETS: 281 10*3/uL (ref 150–400)
RBC: 4.87 MIL/uL (ref 4.22–5.81)
RDW: 20.8 % — ABNORMAL HIGH (ref 11.5–15.5)
WBC: 11 10*3/uL — ABNORMAL HIGH (ref 4.0–10.5)

## 2014-01-27 LAB — PROTIME-INR
INR: 3.03 — ABNORMAL HIGH (ref 0.00–1.49)
PROTHROMBIN TIME: 31.7 s — AB (ref 11.6–15.2)

## 2014-01-27 LAB — I-STAT TROPONIN, ED: Troponin i, poc: 0 ng/mL (ref 0.00–0.08)

## 2014-01-27 LAB — TROPONIN I: Troponin I: <0.03 ng/mL (ref <0.031)

## 2014-01-27 LAB — BRAIN NATRIURETIC PEPTIDE: B Natriuretic Peptide: 30 pg/mL (ref 0.0–100.0)

## 2014-01-27 LAB — GLUCOSE, CAPILLARY: Glucose-Capillary: 74 mg/dL (ref 70–99)

## 2014-01-27 LAB — RAPID URINE DRUG SCREEN, HOSP PERFORMED
Amphetamines: NOT DETECTED
Barbiturates: NOT DETECTED
Benzodiazepines: NOT DETECTED
Cocaine: NOT DETECTED
Opiates: POSITIVE — AB
TETRAHYDROCANNABINOL: NOT DETECTED

## 2014-01-27 LAB — BASIC METABOLIC PANEL
ANION GAP: 9 (ref 5–15)
BUN: 12 mg/dL (ref 6–23)
CO2: 26 mmol/L (ref 19–32)
CREATININE: 0.6 mg/dL (ref 0.50–1.35)
Calcium: 9.5 mg/dL (ref 8.4–10.5)
Chloride: 106 mEq/L (ref 96–112)
GFR calc Af Amer: >90 mL/min (ref >90)
Glucose, Bld: 113 mg/dL — ABNORMAL HIGH (ref 70–99)
POTASSIUM: 3.8 mmol/L (ref 3.5–5.1)
Sodium: 141 mmol/L (ref 135–145)

## 2014-01-27 MED ORDER — ALBUTEROL SULFATE (2.5 MG/3ML) 0.083% IN NEBU: 2.5000 mg | INHALATION_SOLUTION | Freq: Four times a day (QID) | RESPIRATORY_TRACT | Status: DC | PRN

## 2014-01-27 MED ORDER — LEVETIRACETAM 500 MG PO TABS
500.0000 mg | ORAL_TABLET | Freq: Two times a day (BID) | ORAL | Status: DC
  Administered 2014-01-27 – 2014-01-29 (×4): 500 mg via ORAL
  Filled 2014-01-27 (×5): qty 1

## 2014-01-27 MED ORDER — PANTOPRAZOLE SODIUM 40 MG PO TBEC
40.0000 mg | DELAYED_RELEASE_TABLET | Freq: Two times a day (BID) | ORAL | Status: DC
  Administered 2014-01-27 – 2014-01-29 (×4): 40 mg via ORAL
  Filled 2014-01-27 (×4): qty 1

## 2014-01-27 MED ORDER — DICLOFENAC SODIUM 1 % TD GEL: 2.0000 g | Freq: Four times a day (QID) | TRANSDERMAL | Status: DC | PRN

## 2014-01-27 MED ORDER — WARFARIN SODIUM 1 MG PO TABS
1.0000 mg | ORAL_TABLET | Freq: Once | ORAL | Status: AC
  Administered 2014-01-27: 1 mg via ORAL
  Filled 2014-01-27: qty 1

## 2014-01-27 MED ORDER — HYDROCODONE-ACETAMINOPHEN 5-325 MG PO TABS
1.0000 | ORAL_TABLET | ORAL | Status: DC | PRN
  Administered 2014-01-27: 2 via ORAL
  Administered 2014-01-28: 1 via ORAL
  Administered 2014-01-28 – 2014-01-29 (×3): 2 via ORAL
  Filled 2014-01-27: qty 1
  Filled 2014-01-27 (×5): qty 2

## 2014-01-27 MED ORDER — WARFARIN - PHARMACIST DOSING INPATIENT: Freq: Every day | Status: DC

## 2014-01-27 MED ORDER — HYDROMORPHONE HCL 1 MG/ML IJ SOLN
1.0000 mg | Freq: Once | INTRAMUSCULAR | Status: AC
  Administered 2014-01-27: 1 mg via INTRAVENOUS
  Filled 2014-01-27: qty 1

## 2014-01-27 MED ORDER — ALBUTEROL SULFATE HFA 108 (90 BASE) MCG/ACT IN AERS: 2.0000 | INHALATION_SPRAY | Freq: Four times a day (QID) | RESPIRATORY_TRACT | Status: DC | PRN

## 2014-01-27 MED ORDER — PIPERACILLIN-TAZOBACTAM 3.375 G IVPB
3.3750 g | Freq: Three times a day (TID) | INTRAVENOUS | Status: DC
  Administered 2014-01-27 – 2014-01-29 (×5): 3.375 g via INTRAVENOUS
  Filled 2014-01-27 (×7): qty 50

## 2014-01-27 MED ORDER — SODIUM CHLORIDE 0.9 % IJ SOLN
3.0000 mL | Freq: Two times a day (BID) | INTRAMUSCULAR | Status: DC
  Administered 2014-01-29: 3 mL via INTRAVENOUS

## 2014-01-27 MED ORDER — MORPHINE SULFATE 4 MG/ML IJ SOLN
4.0000 mg | Freq: Once | INTRAMUSCULAR | Status: AC
  Administered 2014-01-27: 4 mg via INTRAVENOUS
  Filled 2014-01-27: qty 1

## 2014-01-27 MED ORDER — IPRATROPIUM-ALBUTEROL 0.5-2.5 (3) MG/3ML IN SOLN: 3.0000 mL | Freq: Four times a day (QID) | RESPIRATORY_TRACT | Status: DC | PRN

## 2014-01-27 MED ORDER — VANCOMYCIN HCL IN DEXTROSE 1-5 GM/200ML-% IV SOLN
1000.0000 mg | Freq: Three times a day (TID) | INTRAVENOUS | Status: DC
  Administered 2014-01-27 – 2014-01-29 (×6): 1000 mg via INTRAVENOUS
  Filled 2014-01-27 (×8): qty 200

## 2014-01-27 MED ORDER — INSULIN ASPART 100 UNIT/ML ~~LOC~~ SOLN
0.0000 [IU] | Freq: Three times a day (TID) | SUBCUTANEOUS | Status: DC
  Administered 2014-01-28 – 2014-01-29 (×2): 1 [IU] via SUBCUTANEOUS

## 2014-01-27 MED ORDER — PIPERACILLIN-TAZOBACTAM 3.375 G IVPB 30 MIN
3.3750 g | Freq: Once | INTRAVENOUS | Status: AC
  Administered 2014-01-27: 3.375 g via INTRAVENOUS
  Filled 2014-01-27: qty 50

## 2014-01-27 MED ORDER — PAROXETINE HCL 20 MG PO TABS
20.0000 mg | ORAL_TABLET | Freq: Every day | ORAL | Status: DC
  Administered 2014-01-28 – 2014-01-29 (×2): 20 mg via ORAL
  Filled 2014-01-27 (×2): qty 1

## 2014-01-27 MED ORDER — NICOTINE 14 MG/24HR TD PT24
14.0000 mg | MEDICATED_PATCH | TRANSDERMAL | Status: DC
  Administered 2014-01-27: 14 mg via TRANSDERMAL
  Filled 2014-01-27 (×2): qty 1

## 2014-01-27 MED ORDER — ADULT MULTIVITAMIN W/MINERALS CH
1.0000 | ORAL_TABLET | Freq: Every day | ORAL | Status: DC
  Administered 2014-01-27 – 2014-01-29 (×3): 1 via ORAL
  Filled 2014-01-27 (×3): qty 1

## 2014-01-27 MED ORDER — MOMETASONE FURO-FORMOTEROL FUM 100-5 MCG/ACT IN AERO
2.0000 | INHALATION_SPRAY | Freq: Two times a day (BID) | RESPIRATORY_TRACT | Status: DC
  Administered 2014-01-27 – 2014-01-29 (×3): 2 via RESPIRATORY_TRACT
  Filled 2014-01-27: qty 8.8

## 2014-01-27 MED ORDER — IPRATROPIUM-ALBUTEROL 20-100 MCG/ACT IN AERS: 1.0000 | INHALATION_SPRAY | Freq: Four times a day (QID) | RESPIRATORY_TRACT | Status: DC | PRN

## 2014-01-27 MED ORDER — SODIUM CHLORIDE 0.9 % IV SOLN
INTRAVENOUS | Status: AC
  Administered 2014-01-27: 21:00:00 via INTRAVENOUS

## 2014-01-27 MED ORDER — CLONAZEPAM 1 MG PO TABS
1.0000 mg | ORAL_TABLET | Freq: Three times a day (TID) | ORAL | Status: DC | PRN
  Administered 2014-01-27 – 2014-01-29 (×3): 1 mg via ORAL
  Filled 2014-01-27 (×3): qty 1

## 2014-01-27 NOTE — ED Notes (Signed)
Pt arrives via [facility] from home. Pt states he was at [facility] last week and was dx with "blood clots in his lungs and started taking warfarin". Pt has c/o centralized chest pain accompanied with SOB and dizziness. Pt is currently on 3L of O2 per [place] and O2 sat at 90%.

## 2014-01-27 NOTE — ED Notes (Signed)
Pt weaned to 5L via [place] per Dr. Doy Mince.

## 2014-01-27 NOTE — ED Notes (Signed)
O2 increased to 3L via [place]. O2 sat holding around 85%, awaiting for Dr. Doy Mince.

## 2014-01-27 NOTE — ED Notes (Signed)
Attempted to call report to 5W RN.
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
## 2014-01-27 NOTE — ED Notes (Signed)
Dr. Doy Mince notified about pt pain level. No pain medicine order at this time until further evaluation by MD.
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
## 2014-01-27 NOTE — ED Notes (Signed)
Pt placed on a non-rebreather at 15L. Per Dr. Doy Mince, O2 saturation around 95% and holding.
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
## 2014-01-27 NOTE — Telephone Encounter (Signed)
Received transferred call from our departmental secretary with [name], [name] from [employer] advising she was at the home of the patient. INR was 4.4 on 3mg  warfarin for past 4 days. INR this past Monday 11-JAN-16 was 2.5. See additional documentation. Patient is concomitantly on 3 antibiotics known to cause hypoprothrombinemic response to warfarin. Though his INR was 2.5 on Monday 11-JAN-16 this was an anticipated response due to the interactions of these antibiotics with warfarin. We had on Monday elected to check the INR on Friday 15-JAN-16. [facility] RN visit was arranged due to transportation issues impacting on ability of patient to arrive to [facility] on his on. I spoke with the patient who reported that he has had no symptoms of bleeding, i.e. No nosebleeds, no hemoptysis, no hematemesis, no melena or BRBPR, no ecchymosis, etc. He was instructed ([facility] RN was in attendance and recorded these instructions and reinforced them to patient) to take only 1/2 x 3mg  (1.5mg  warfarin) on Friday 15-JAN-16; only 1/2 x 3mg  (1.5mg  warfarin) on Saturday 16-JAN-16; and 1x3mg  warfarin on Sunday 17-JAN-16. He will have INR repeated by the [facility] RN on Monday 18-JAN-16. Though the [facility] will be closed in observance of [place], the [facility] RN will be working and she was provided my cell phone number by which she may call me to let me know the results of Monday's INR. Patient understands and was able to verbalize his understanding of these instructions.
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
## 2014-01-27 NOTE — ED Notes (Signed)
Pt O2 sat at 84% on 2L. Dr. Doy Mince made aware.

## 2014-01-28 ENCOUNTER — Inpatient Hospital Stay (HOSPITAL_COMMUNITY): Payer: Medicaid Other

## 2014-01-28 LAB — CBC WITH DIFFERENTIAL/PLATELET
Basophils Absolute: 0 10*3/uL (ref 0.0–0.1)
Basophils Relative: 0 % (ref 0–1)
EOS ABS: 0.4 10*3/uL (ref 0.0–0.7)
EOS PCT: 3 % (ref 0–5)
HCT: 38.9 % — ABNORMAL LOW (ref 39.0–52.0)
HEMOGLOBIN: 13.2 g/dL (ref 13.0–17.0)
LYMPHS PCT: 30 % (ref 12–46)
Lymphs Abs: 3.6 10*3/uL (ref 0.7–4.0)
MCH: 28.1 pg (ref 26.0–34.0)
MCHC: 33.9 g/dL (ref 30.0–36.0)
MCV: 82.9 fL (ref 78.0–100.0)
Monocytes Absolute: 1 10*3/uL (ref 0.1–1.0)
Monocytes Relative: 8 % (ref 3–12)
NEUTROS ABS: 7.1 10*3/uL (ref 1.7–7.7)
NEUTROS PCT: 59 % (ref 43–77)
PLATELETS: 284 10*3/uL (ref 150–400)
RBC: 4.69 MIL/uL (ref 4.22–5.81)
RDW: 20.8 % — AB (ref 11.5–15.5)
WBC: 12.1 10*3/uL — ABNORMAL HIGH (ref 4.0–10.5)

## 2014-01-28 LAB — BASIC METABOLIC PANEL
Anion gap: 4 — ABNORMAL LOW (ref 5–15)
BUN: 14 mg/dL (ref 6–23)
CALCIUM: 9.6 mg/dL (ref 8.4–10.5)
CO2: 29 mmol/L (ref 19–32)
CREATININE: 0.67 mg/dL (ref 0.50–1.35)
Chloride: 106 mEq/L (ref 96–112)
GFR calc non Af Amer: >90 mL/min (ref >90)
Glucose, Bld: 102 mg/dL — ABNORMAL HIGH (ref 70–99)
Potassium: 4.1 mmol/L (ref 3.5–5.1)
Sodium: 139 mmol/L (ref 135–145)

## 2014-01-28 LAB — GLUCOSE, CAPILLARY
GLUCOSE-CAPILLARY: 100 mg/dL — AB (ref 70–99)
GLUCOSE-CAPILLARY: 136 mg/dL — AB (ref 70–99)
GLUCOSE-CAPILLARY: 221 mg/dL — AB (ref 70–99)
Glucose-Capillary: 111 mg/dL — ABNORMAL HIGH (ref 70–99)

## 2014-01-28 LAB — INFLUENZA PANEL BY PCR (TYPE A & B)
H1N1 flu by pcr: NOT DETECTED
Influenza A By PCR: NEGATIVE
Influenza B By PCR: NEGATIVE

## 2014-01-28 LAB — PROTIME-INR
INR: 3.52 — ABNORMAL HIGH (ref 0.00–1.49)
PROTHROMBIN TIME: 35.5 s — AB (ref 11.6–15.2)

## 2014-01-28 LAB — TROPONIN I: Troponin I: <0.03 ng/mL (ref <0.031)

## 2014-01-28 MED ORDER — NICOTINE 21 MG/24HR TD PT24
21.0000 mg | MEDICATED_PATCH | TRANSDERMAL | Status: DC
Start: 2014-01-28 — End: 2014-01-29
  Administered 2014-01-28: 21 mg via TRANSDERMAL
  Filled 2014-01-28 (×2): qty 1

## 2014-01-28 MED ORDER — CETYLPYRIDINIUM CHLORIDE 0.05 % MT LIQD
7.0000 mL | Freq: Two times a day (BID) | OROMUCOSAL | Status: DC
  Administered 2014-01-28 – 2014-01-29 (×2): 7 mL via OROMUCOSAL

## 2014-01-28 MED ORDER — MORPHINE SULFATE 4 MG/ML IJ SOLN
4.0000 mg | Freq: Once | INTRAMUSCULAR | Status: AC
  Administered 2014-01-28: 4 mg via INTRAVENOUS
  Filled 2014-01-28: qty 1

## 2014-01-28 NOTE — Progress Notes (Signed)
Subjective: [name] feels "better" today. He thinks he is breathing more easily. Would like to smoke marijuana in his room, but doesn't have any.  Objective: Vital signs in last 24 hours: Filed Vitals:   01/27/14 2211 01/28/14 0500 01/28/14 0523 01/28/14 1314  BP:   145/78 114/71  Pulse:   89 112  Temp:   97.4 F (36.3 C) 97.8 F (36.6 C)  TempSrc:   Oral Oral  Resp:   18 16  Height:      Weight:  123 lb 7.3 oz (56 kg)    SpO2: 97%  95% 96%   Weight change:   Intake/Output Summary (Last 24 hours) at 01/28/14 1357 Last data filed at 01/28/14 1119  Gross per 24 hour  Intake    240 ml  Output    200 ml  Net     40 ml   Physical Exam: Appearance: in NAD, cachectic, but looks much more healthy than on prior admissions, resting in bed, 2L [place] in place HEENT: AT/[place], PERRL, EOMi, poor dentition, no lymphadenopathy Heart: RRR, normal S1S2, slightly tender to palpation in the chest Lungs: poor inspiratory effort, left lung rales, otherwise clear with no wheezing  Abdomen: thin, BS+, soft, nontender, no lymphadenopathy, PEG tube in place and clean with no signs of infection Musculoskeletal: nontender, no edema Neurologic: A&Ox3, CN intact, sensation intact throughout, coordination and calculations intact, strength decreased LLE Skin: very thin, 1 mm diameter circular papules on both hands R>L, slightly tender to palpation  Lab Results: Basic Metabolic Panel:  Recent Labs Lab 01/27/14 1041 01/28/14 0347  NA 141 139  K 3.8 4.1  CL 106 106  CO2 26 29  GLUCOSE 113* 102*  BUN 12 14  CREATININE 0.60 0.67  CALCIUM 9.5 9.6   CBC:  Recent Labs Lab 01/27/14 1041 01/28/14 0347  WBC 11.0* 12.1*  NEUTROABS  --  7.1  HGB 13.8 13.2  HCT 40.7 38.9*  MCV 83.6 82.9  PLT 281 284   Cardiac Enzymes:  Recent Labs Lab 01/27/14 1113 01/27/14 2204 01/28/14 0347  TROPONINI <0.03 <0.03 <0.03   CBG:  Recent Labs Lab 01/27/14 2206 01/28/14 0757 01/28/14 1201  GLUCAP 74  100* 136*   Coagulation:  Recent Labs Lab 01/22/14 1418 01/27/14 1041 01/28/14 0347  LABPROT  --  31.7* 35.5*  INR 2.50 3.03* 3.52*   Urine Drug Screen: Drugs of Abuse     Component Value Date/Time   LABOPIA POSITIVE* 01/27/2014 2209   LABOPIA NEG 06/01/2011 1115   COCAINSCRNUR NONE DETECTED 01/27/2014 2209   COCAINSCRNUR NEG 06/01/2011 1115   LABBENZ NONE DETECTED 01/27/2014 2209   LABBENZ NEG 06/01/2011 1115   LABBENZ NEG 02/18/2009 2050   AMPHETMU NONE DETECTED 01/27/2014 2209   AMPHETMU NEG 06/01/2011 1115   AMPHETMU NEG 02/18/2009 2050   THCU NONE DETECTED 01/27/2014 2209   THCU NEG 06/01/2011 1115   LABBARB NONE DETECTED 01/27/2014 2209   LABBARB NEG 06/01/2011 1115    Studies/Results: Dg Chest 2 View  01/28/2014   CLINICAL DATA:  Two day history of shortness of breath. Follow-up lingular pneumonia. Current history of diabetes and hypertension. Current smoker. Left-sided pulmonary embolus on 01/02/2014.  EXAM: CHEST  2 VIEW  COMPARISON:  Two-view chest x-ray yesterday dating back to 01/26/2012. CTA chest 01/02/2014.  FINDINGS: Cardiac silhouette normal in size, unchanged. Thoracic aorta atherosclerotic, unchanged. Insert or otherwise streaky and patchy airspace opacities in the lingula, with minimal improvement in aeration since yesterday. No new  pulmonary parenchymal abnormalities. Emphysematous changes throughout both lungs, particularly the upper lobes, unchanged. No pleural effusions. Osseous demineralization with compression fracture of the upper endplate of T5 on the order of 20% or so which is not acute. Old healed fractures involving left upper ribs.  IMPRESSION: 1. Minimal improvement in the lingular pneumonia since yesterday. 2. No new abnormalities.  Baseline COPD/emphysema.   Electronically Signed   By: Evangeline Dakin M.D.   On: 01/28/2014 13:24   Dg Chest 2 View  01/27/2014   CLINICAL DATA:  54 year old male with central chest pain beginning at 8 a.m.  this morning and shortness of breath beginning yesterday. Active pack per day smoker.  EXAM: CHEST  2 VIEW  COMPARISON:  Prior chest x-ray 01/15/2014  FINDINGS: The patient is rotated toward the left. Interval removal of right upper extremity PICC. Stable cardiac and mediastinal contours a given limitations of rotated positioning. Atherosclerotic calcifications are present within the aorta. New and nonspecific lingular opacity which could reflect atelectasis or infiltrate. The remainder the lungs are mildly hyperinflated with a central bronchitic change and interstitial prominence consistent with underlying COPD.  IMPRESSION: New patchy airspace opacity in the lingula concerning for bronchopneumonia.   Electronically Signed   By: Jacqulynn Cadet M.D.   On: 01/27/2014 12:32   Medications: I have reviewed the patient's current medications. Scheduled Meds: . antiseptic oral rinse  7 mL Mouth Rinse BID  . insulin aspart  0-9 Units Subcutaneous TID WC  . levETIRAcetam  500 mg Oral BID  . mometasone-formoterol  2 puff Inhalation BID  . multivitamin with minerals  1 tablet Oral Daily  . nicotine  14 mg Transdermal Q24H  . pantoprazole  40 mg Oral BID  . PARoxetine  20 mg Oral Daily  . piperacillin-tazobactam (ZOSYN)  IV  3.375 g Intravenous 3 times per day  . sodium chloride  3 mL Intravenous Q12H  . vancomycin  1,000 mg Intravenous Q8H  . Warfarin - Pharmacist Dosing Inpatient   Does not apply q1800   Continuous Infusions:  PRN Meds:.albuterol, clonazePAM, diclofenac sodium, HYDROcodone-acetaminophen, ipratropium-albuterol Assessment/Plan: Active Problems:   HCAP (healthcare-associated pneumonia)  [name] is a 54 yo man with a complicated recent medical history who presented with symptoms of URI and shortness of breath and was found on CXR to have a patchy opacity in the lingula concerning for bronchopneumonia.   HCAP: Given his multiple recent hospitalizations, HCAP treatment was  initiated.Influenza negative. - Repeat CXR given that first film was rotated - Incentive spirometer to eliminate atelectasis as cause of symptoms - Vancomycin per pharmacy - Zosyn per pharmacy - IVF NS 100 ml/hr - Combivent 1 puff q6 hours PRN - Albuterol 2 puffs q6hr PRN - Dulera 2 puffs BID  - Hydrocodone-acetaminophen 5-325 mg 1-2 tablets q4hr PRN - Blood cultures x2 pending - O2 PRN for O2 sat >92%  Chest Pain: Some possible EKG changes. Pain likely related to HCAP. No longer present. UDS negative for cocaine. 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 Has been managed as outpatient on amoxicillin, metronidazole and ciprofloxacin.  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 Was also breathing comfortably when O2 turned off during conversation. Chest pain was present, but resolved. Has been taking his coumadin as outpatient. INR 3.03. He is on many  medications that may affect his INR, so he needs close follow up of this.Followed by Dr. Elie Confer as outpatient. - Continue coumadin (at INR goal)  DM2: Last A1c 7.0%. On metformin 500 mg BID. - Insulin SS  Herniated Lumbar Disk with Radiculopathy: He was receiving IV narcotics in the SNF. Given his extensive history of drug abuse and recent IV heroin abuse, this is very concerning. Has Tylenol #3 for his pain 1-2 Q8PRN #120 for the month at home. Needs to be weaned. - Hydrocodone-acetaminophen 5-325 mg 1-2 tablets PRN for pain control - Voltaren gel for knee pain  Lung Nodule Visualized on Chest CT: CT Chest 11/20 shows 3.6 cm nodule in LUL, now thought to be apical scar, 1 month later CT angio did not show this nodule. No need for CT follow up.  Resolved Dysphagia: Started during initial hospitalization and has fully resolved. Eating without any issues. PEG was to be  removed at SNF, but patient left AMA. 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 Followed by Dr. [name].  Anxiety:  - Continue Klonopin 1mg  TID for the next month with plan to wean off next month - Continue Paxil 20mg  by mouth daily  Tobacco Abuse: Has been smoking 1 ppd - Nicotine patch  Diet:  - Regular diet  DVT Ppx:  - On coumadin  Dispo: Disposition is deferred at this time, awaiting improvement of current medical problems.  Anticipated discharge in approximately 1 day(s).   The patient does have a current PCP (Kelby Aline, MD) and does need an [facility] hospital follow-up appointment after discharge.  The patient does not have transportation limitations that hinder transportation to clinic appointments.  .Services Needed at time of discharge: Y = Yes, Blank = No PT:   OT:   RN:   Equipment:   Other:     LOS: 1 day   Drucilla Schmidt, MD 01/28/2014, 1:57 PM

## 2014-01-28 NOTE — Progress Notes (Signed)
UR Completed.  [phone]  

## 2014-01-28 NOTE — Progress Notes (Signed)
ANTICOAGULATION CONSULT NOTE - Follow Up Consult  Pharmacy Consult for coumadin Indication: pulmonary embolus  Allergies  Allergen Reactions  . Buprenorphine Hcl-Naloxone Hcl Shortness Of Breath and Other (See Comments)    "feel like going to die" jittery, trouble breathing, feels hot  . Fish Allergy Hives, Swelling and Rash  . Shellfish Allergy Hives, Itching and Swelling  . Benadryl [Diphenhydramine Hcl] Hives, Itching and Rash    Patient Measurements: Height: 6\' 1"  (185.4 cm) Weight: 123 lb 7.3 oz (56 kg) IBW/kg (Calculated) : 79.9  Vital Signs: Temp: 97.4 F (36.3 C) (01/17 0523) Temp Source: Oral (01/17 0523) BP: 145/78 mmHg (01/17 0523) Pulse Rate: 89 (01/17 0523)  Labs:  Recent Labs  01/27/14 1041 01/27/14 1113 01/27/14 2204 01/28/14 0347  HGB 13.8  --   --  13.2  HCT 40.7  --   --  38.9*  PLT 281  --   --  284  LABPROT 31.7*  --   --  35.5*  INR 3.03*  --   --  3.52*  CREATININE 0.60  --   --  0.67  TROPONINI  --  <0.03 <0.03 <0.03    Estimated Creatinine Clearance: 82.6 mL/min (by C-G formula based on Cr of 0.67).  Assessment: Patient is a 54 y.o M on coumadin for hx PE.  INR is supratherapeutic at 3.52 today after 1mg  dose given last night.  CBC is stable.  No bleeding documented.  Goal of Therapy:  INR 2-3    Plan:  - hold coumadin dose today - f/u with AM labs  Grayton Lobo P 01/28/2014,11:34 AM

## 2014-01-28 NOTE — Evaluation (Signed)
Physical Therapy Evaluation Patient Details Name: [name] MRN: [id number] DOB: 1960/09/12 Today's Date: 01/28/2014   History of Present Illness  [name] is a 54 yo man with a complicated recent medical history who presented with symptoms of URI and shortness of breath and was found on CXR to have a patchy opacity in the lingula concerning for bronchopneumonia.  Clinical Impression   Pt admitted with above diagnosis. Pt currently with functional limitations due to the deficits listed below (see PT Problem List).  Pt will benefit from skilled PT to increase their independence and safety with mobility to allow discharge to the venue listed below.       Follow Up Recommendations CIR;Supervision/Assistance - 24 hour    Equipment Recommendations  Other (comment) (To Be determined)    Recommendations for Other Services Rehab consult;OT consult     Precautions / Restrictions Precautions Precautions: Fall      Mobility  Bed Mobility Overal bed mobility: Needs Assistance Bed Mobility: Supine to Sit     Supine to sit: Mod assist     General bed mobility comments: Mod assist to pull to sit and use of bed pad to square off hips at EOB  Transfers Overall transfer level: Needs assistance Equipment used: 1 person hand held assist Transfers: Sit to/from Stand Sit to Stand: Mod assist         General transfer comment: Light mod anti gravity assist for lift off from bed; handheld assist at initial stand for steadiness  Ambulation/Gait Ambulation/Gait assistance: Min assist Ambulation Distance (Feet):  (pivot steps bed to chair) Assistive device: 1 person hand held assist Gait Pattern/deviations: Shuffle     General Gait Details: Unsteady and fatigues quickly; requested to sit due to back pain  Stairs            Wheelchair Mobility    Modified Rankin (Stroke Patients Only)       Balance                                              Pertinent Vitals/Pain Pain Assessment: 0-10 Pain Score: 9  Pain Location: Back pain; previously discussed pain meds with RN Pain Descriptors / Indicators: Aching Pain Intervention(s): Limited activity within patient's tolerance;Monitored during session;Repositioned    Home Living Family/patient expects to be discharged to:: Private residence Living Arrangements: Other relatives (Brother) Available Help at Discharge: Available PRN/intermittently Type of Home: House Home Access: Level entry (to be clarified)     Home Layout: One level Home Equipment: Cane - single point      Prior Function Level of Independence: Independent with assistive device(s)         Comments: Unclear of patient's abilities pta with ADL's.     Hand Dominance        Extremity/Trunk Assessment   Upper Extremity Assessment: Generalized weakness           Lower Extremity Assessment: Generalized weakness         Communication   Communication: No difficulties  Cognition Arousal/Alertness: Awake/alert Behavior During Therapy: WFL for tasks assessed/performed;Restless Overall Cognitive Status: Impaired/Different from baseline Area of Impairment: Safety/judgement         Safety/Judgement: Decreased awareness of safety;Decreased awareness of deficits          General Comments General comments (skin integrity, edema, etc.): Session conducted on supplemental O2  Exercises        Assessment/Plan    PT Assessment Patient needs continued PT services  PT Diagnosis Difficulty walking;Generalized weakness   PT Problem List Decreased strength;Decreased activity tolerance;Decreased balance;Decreased mobility;Decreased knowledge of use of DME;Decreased safety awareness;Decreased knowledge of precautions;Pain;Cardiopulmonary status limiting activity  PT Treatment Interventions DME instruction;Gait training;Stair training;Functional mobility training;Therapeutic activities;Therapeutic  exercise;Balance training;Patient/family education;Cognitive remediation   PT Goals (Current goals can be found in the Care Plan section) Acute Rehab PT Goals Patient Stated Goal: very motivated to be able to go home PT Goal Formulation: With patient Time For Goal Achievement: 02/11/14 Potential to Achieve Goals: Good    Frequency Min 3X/week   Barriers to discharge Decreased caregiver support Given pt's insurance, I am not sure that [place] therapies are available to him    Co-evaluation               End of Session Equipment Utilized During Treatment: Gait belt Activity Tolerance: Patient limited by fatigue Patient left: in chair;with call bell/phone within reach;with chair alarm set Nurse Communication: Mobility status         Time: 6962-9528 PT Time Calculation (min) (ACUTE ONLY): 22 min   Charges:   PT Evaluation $Initial PT Evaluation Tier I: 1 Procedure PT Treatments $Therapeutic Activity: 8-22 mins   PT G Codes:        [name] 01/28/2014, 1:11 PM  [name], [place] Pager [phone] Office [phone]

## 2014-01-29 ENCOUNTER — Telehealth: Payer: Self-pay | Admitting: Pharmacist

## 2014-01-29 LAB — PROTIME-INR
INR: 2.85 — ABNORMAL HIGH (ref 0.00–1.49)
Prothrombin Time: 30.2 seconds — ABNORMAL HIGH (ref 11.6–15.2)

## 2014-01-29 LAB — GLUCOSE, CAPILLARY: GLUCOSE-CAPILLARY: 130 mg/dL — AB (ref 70–99)

## 2014-01-29 MED ORDER — HYDROCODONE-ACETAMINOPHEN 5-325 MG PO TABS
2.0000 | ORAL_TABLET | Freq: Once | ORAL | Status: AC
  Administered 2014-01-29: 2 via ORAL

## 2014-01-29 MED ORDER — WARFARIN 0.5 MG HALF TABLET
0.5000 mg | ORAL_TABLET | Freq: Once | ORAL | Status: DC
  Filled 2014-01-29: qty 1

## 2014-01-29 NOTE — Progress Notes (Signed)
ANTICOAGULATION CONSULT NOTE - Follow Up Consult  Pharmacy Consult for Coumadin Indication: pulmonary embolus  Allergies  Allergen Reactions  . Buprenorphine Hcl-Naloxone Hcl Shortness Of Breath and Other (See Comments)    "feel like going to die" jittery, trouble breathing, feels hot  . Fish Allergy Hives, Swelling and Rash  . Shellfish Allergy Hives, Itching and Swelling  . Benadryl [Diphenhydramine Hcl] Hives, Itching and Rash    Patient Measurements: Height: 6\' 1"  (185.4 cm) Weight: 129 lb 10.1 oz (58.8 kg) IBW/kg (Calculated) : 79.9  Vital Signs: Temp: 97.8 F (36.6 C) (01/18 0558) Temp Source: Oral (01/18 0558) BP: 133/76 mmHg (01/18 0558) Pulse Rate: 77 (01/18 0558)  Labs:  Recent Labs  01/27/14 1041 01/27/14 1113 01/27/14 2204 01/28/14 0347 01/29/14 0651  HGB 13.8  --   --  13.2  --   HCT 40.7  --   --  38.9*  --   PLT 281  --   --  284  --   LABPROT 31.7*  --   --  35.5* 30.2*  INR 3.03*  --   --  3.52* 2.85*  CREATININE 0.60  --   --  0.67  --   TROPONINI  --  <0.03 <0.03 <0.03  --     Estimated Creatinine Clearance: 86.8 mL/min (by C-G formula based on Cr of 0.67).  Assessment: Patient is a 54 y.o M on coumadin for hx PE.   INR = 2.85 today  Goal of Therapy:  INR 2-3   Plan:  Coumadin 0.5 mg po x 1 dose today  Follow up AM INR  Thank you. Anette Guarneri, PharmD [phone]   01/29/2014,10:43 AM

## 2014-01-29 NOTE — Progress Notes (Signed)
Told patient that MD was on their way to see him d/t comment of "shooting his brains out". He denied making that comment and told me that I was confused and hearing things.

## 2014-01-29 NOTE — Progress Notes (Signed)
[facility]  Patient Status: Active (receiving services up to time of hospitalization)  [facility] is providing the following services: RN  If patient discharges after hours, please call [phone].   Consepcion Hearing 01/29/2014, 9:56 AM

## 2014-01-29 NOTE — Progress Notes (Signed)
Subjective: [name] feels "better", he would like to leave today "no matter what". He says he has some financial issue to manage today outside of the hospital. He understand the need to take his medications as prescribed (as he was doing before the hospitalization). He understands that if he stayed, his PEG tube could be removed.   Objective: Vital signs in last 24 hours: Filed Vitals:   01/28/14 2138 01/28/14 2323 01/29/14 0500 01/29/14 0558  BP:  116/53  133/76  Pulse:  85  77  Temp:  98.9 F (37.2 C)  97.8 F (36.6 C)  TempSrc:  Oral  Oral  Resp:  15  15  Height:      Weight:   129 lb 10.1 oz (58.8 kg)   SpO2: 96% 98%  100%   Weight change: 5 lb 15.2 oz (2.7 kg)  Intake/Output Summary (Last 24 hours) at 01/29/14 0750 Last data filed at 01/29/14 0559  Gross per 24 hour  Intake   1430 ml  Output    575 ml  Net    855 ml   Physical Exam: Appearance: in NAD, cachectic, but looks much more healthy than on prior admissions, resting in bed, [place] not in place HEENT: AT/[place], PERRL, EOMi, poor dentition, no lymphadenopathy Heart: RRR, normal S1S2, slightly tender to palpation in the chest Lungs: poor inspiratory effort, left lung rales, otherwise clear with no wheezing  Abdomen: thin, BS+, soft, nontender, no lymphadenopathy, PEG tube in place and clean with no signs of infection; PEG is clogged Musculoskeletal: nontender, no edema Neurologic: A&Ox3, CN intact, sensation intact throughout, coordination and calculations intact, strength decreased LLE Skin: very thin, 1 mm diameter circular papules on both hands R>L, slightly tender to palpation  Lab Results: Basic Metabolic Panel:  Recent Labs Lab 01/27/14 1041 01/28/14 0347  NA 141 139  K 3.8 4.1  CL 106 106  CO2 26 29  GLUCOSE 113* 102*  BUN 12 14  CREATININE 0.60 0.67  CALCIUM 9.5 9.6   CBC:  Recent Labs Lab 01/27/14 1041 01/28/14 0347  WBC 11.0* 12.1*  NEUTROABS  --  7.1  HGB 13.8 13.2  HCT 40.7 38.9*    MCV 83.6 82.9  PLT 281 284   Cardiac Enzymes:  Recent Labs Lab 01/27/14 1113 01/27/14 2204 01/28/14 0347  TROPONINI <0.03 <0.03 <0.03   CBG:  Recent Labs Lab 01/27/14 2206 01/28/14 0757 01/28/14 1201 01/28/14 1708 01/28/14 2321  GLUCAP 74 100* 136* 111* 221*   Coagulation:  Recent Labs Lab 01/22/14 1418 01/27/14 1041 01/28/14 0347 01/29/14 0651  LABPROT  --  31.7* 35.5* 30.2*  INR 2.50 3.03* 3.52* 2.85*   Urine Drug Screen: Drugs of Abuse     Component Value Date/Time   LABOPIA POSITIVE* 01/27/2014 2209   LABOPIA NEG 06/01/2011 1115   COCAINSCRNUR NONE DETECTED 01/27/2014 2209   COCAINSCRNUR NEG 06/01/2011 1115   LABBENZ NONE DETECTED 01/27/2014 2209   LABBENZ NEG 06/01/2011 1115   LABBENZ NEG 02/18/2009 2050   AMPHETMU NONE DETECTED 01/27/2014 2209   AMPHETMU NEG 06/01/2011 1115   AMPHETMU NEG 02/18/2009 2050   THCU NONE DETECTED 01/27/2014 2209   THCU NEG 06/01/2011 1115   LABBARB NONE DETECTED 01/27/2014 2209   LABBARB NEG 06/01/2011 1115    Studies/Results: Dg Chest 2 View  01/28/2014   CLINICAL DATA:  Two day history of shortness of breath. Follow-up lingular pneumonia. Current history of diabetes and hypertension. Current smoker. Left-sided pulmonary embolus on 01/02/2014.  EXAM:  CHEST  2 VIEW  COMPARISON:  Two-view chest x-ray yesterday dating back to 01/26/2012. CTA chest 01/02/2014.  FINDINGS: Cardiac silhouette normal in size, unchanged. Thoracic aorta atherosclerotic, unchanged. Insert or otherwise streaky and patchy airspace opacities in the lingula, with minimal improvement in aeration since yesterday. No new pulmonary parenchymal abnormalities. Emphysematous changes throughout both lungs, particularly the upper lobes, unchanged. No pleural effusions. Osseous demineralization with compression fracture of the upper endplate of T5 on the order of 20% or so which is not acute. Old healed fractures involving left upper ribs.  IMPRESSION: 1. Minimal  improvement in the lingular pneumonia since yesterday. 2. No new abnormalities.  Baseline COPD/emphysema.   Electronically Signed   By: Evangeline Dakin M.D.   On: 01/28/2014 13:24   Dg Chest 2 View  01/27/2014   CLINICAL DATA:  54 year old male with central chest pain beginning at 8 a.m. this morning and shortness of breath beginning yesterday. Active pack per day smoker.  EXAM: CHEST  2 VIEW  COMPARISON:  Prior chest x-ray 01/15/2014  FINDINGS: The patient is rotated toward the left. Interval removal of right upper extremity PICC. Stable cardiac and mediastinal contours a given limitations of rotated positioning. Atherosclerotic calcifications are present within the aorta. New and nonspecific lingular opacity which could reflect atelectasis or infiltrate. The remainder the lungs are mildly hyperinflated with a central bronchitic change and interstitial prominence consistent with underlying COPD.  IMPRESSION: New patchy airspace opacity in the lingula concerning for bronchopneumonia.   Electronically Signed   By: Jacqulynn Cadet M.D.   On: 01/27/2014 12:32   Medications: I have reviewed the patient's current medications. Scheduled Meds: . antiseptic oral rinse  7 mL Mouth Rinse BID  . insulin aspart  0-9 Units Subcutaneous TID WC  . levETIRAcetam  500 mg Oral BID  . mometasone-formoterol  2 puff Inhalation BID  . multivitamin with minerals  1 tablet Oral Daily  . nicotine  21 mg Transdermal Q24H  . pantoprazole  40 mg Oral BID  . PARoxetine  20 mg Oral Daily  . piperacillin-tazobactam (ZOSYN)  IV  3.375 g Intravenous 3 times per day  . sodium chloride  3 mL Intravenous Q12H  . vancomycin  1,000 mg Intravenous Q8H  . Warfarin - Pharmacist Dosing Inpatient   Does not apply q1800   Continuous Infusions:  PRN Meds:.albuterol, clonazePAM, diclofenac sodium, HYDROcodone-acetaminophen, ipratropium-albuterol Assessment/Plan: Active Problems:   HCAP (healthcare-associated pneumonia)  Mr.  [name] is a 54 yo man with a complicated recent medical history who presented with symptoms of URI and shortness of breath and was found on CXR to have a patchy opacity in the lingula concerning for bronchopneumonia. On repeat CXR (initial was rotated), lingular pneumonia was still visualized with minimal improvement. It is not likely that he developed a pneumonia on his extensive outpatient antibiotics for his brain abscesses. So, he either has atelectasis as the source of his CXR findings and pain, or perhaps he was not taking his antibiotics as an outpatient and developed a pneumonia. Patient wishes to leave AMA today.  HCAP: Given his multiple recent hospitalizations and his CXR findings, HCAP treatment was initiated.Influenza negative. - Continue incentive spirometer to eliminate atelectasis (may very well be cause of symptoms) - Vancomycin per pharmacy - Zosyn per pharmacy - IVF NS complete - Combivent 1 puff q6 hours PRN - Albuterol 2 puffs q6hr PRN - Dulera 2 puffs BID  - Hydrocodone-acetaminophen 5-325 mg 1-2 tablets q4hr PRN - Blood cultures x2  pending - O2 PRN for O2 sat >92%  Chest Pain: Some possible EKG changes. Pain likely related to HCAP. No longer present. UDS negative for cocaine. 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 Has been managed as outpatient on amoxicillin, metronidazole and ciprofloxacin.  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 Was also breathing comfortably when O2 turned off during conversation. Chest pain was present, but resolved. Has been taking his coumadin as outpatient. INR 3.03. He is on many medications that may affect his INR, so he needs close follow up of this.Followed by Dr. Elie Confer as outpatient. - Continue coumadin, current INR 2.85 (at INR goal of 2-3)  DM2: Last A1c  7.0%. On metformin 500 mg BID. - Insulin SS  Herniated Lumbar Disk with Radiculopathy: He was receiving IV narcotics in the SNF. Given his extensive history of drug abuse and recent IV heroin abuse, this is very concerning. Has Tylenol #3 for his pain 1-2 Q8PRN #120 for the month at home. Needs to be weaned. - Hydrocodone-acetaminophen 5-325 mg 1-2 tablets PRN for pain control - Voltaren gel for knee pain  Lung Nodule Visualized on Chest CT: CT Chest 11/20 shows 3.6 cm nodule in LUL, now thought to be apical scar, 1 month later CT angio did not show this nodule.  - CT follow up in 1 year  Resolved Dysphagia: Started during initial hospitalization and has fully resolved. Eating without any issues. PEG was to be removed at SNF, but patient left AMA. 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 Followed by Dr. [name].  Anxiety:  - Continue Klonopin 1mg  TID for the next month with plan to wean off next month - Continue Paxil 20mg  by mouth daily  Tobacco Abuse: Has been smoking 1 ppd, attempted to smoke in hospital overnight. - Nicotine patch increased overnight  Diet:  - Regular diet  DVT Ppx:  - On coumadin  Dispo: Disposition is deferred at this time, awaiting improvement of current medical problems.  Anticipated discharge in approximately 0 day(s). Patient wishes to leave AMA.  The patient does have a current PCP (Kelby Aline, MD) and does need an [facility] hospital follow-up appointment after discharge.  The patient does not have transportation limitations that hinder transportation to clinic appointments.  .Services Needed at time of discharge: Y = Yes, Blank = No PT:   OT:   RN:   Equipment:   Other:     LOS: 2 days   Drucilla Schmidt, MD 01/29/2014, 7:50 AM

## 2014-01-29 NOTE — Progress Notes (Signed)
Pt signed out AMA. IV catheters d/c'd and pressure applied. PEG tube flushed.

## 2014-01-29 NOTE — Telephone Encounter (Signed)
Called patient at his home--anticipating that a [facility] RN would have come to his home and collected PT/INR today. Patient advised he had been admitted over the weekend and was discharged home today. I reviewed his inpatient hospitalization record as well as his discharge summary. Patient states he was advised to take 1/2 x 3mg  (1.5mg ) warfarin PO today at 4PM. I concur with that clinical decision. His INR today before discharge from hospital was 2.85. He remains on 3 antibiotics known to cause a marked hyperprothrombinemic response. I called [facility] and they have an order to see the patient in the home setting TOMORROW [id number]. They are to go to his home and collect PT/INR tomorrow and text or call me the results at [phone].

## 2014-01-29 NOTE — Progress Notes (Signed)
Rehab Admissions Coordinator Note:  Patient was screened by Retta Diones for appropriateness for an Inpatient Acute Rehab Consult.  At this time, we are recommending Inpatient Rehab consult.  Retta Diones 01/29/2014, 9:27 AM  I can be reached at [phone].

## 2014-01-29 NOTE — Discharge Summary (Signed)
PATIENT LEFT AGAINST MEDICAL ADVICE (AMA)  Name: [name] MRN: [id number] DOB: 1960/12/14 54 y.o. PCP: Kelby Aline, MD  Date of Admission: 01/27/2014  9:45 AM Date of Discharge: 01/29/2014 Attending Physician: Dr. Dareen Piano  Discharge Diagnosis: Active Problems:   HCAP (healthcare-associated pneumonia)  Discharge Medications:   Medication List    ASK your doctor about these medications        acetaminophen-codeine 300-30 MG per tablet  Commonly known as:  TYLENOL #3  Take 1-2 tablets by mouth every 8 (eight) hours as needed for moderate pain.     albuterol 108 (90 BASE) MCG/ACT inhaler  Commonly known as:  PROVENTIL HFA;VENTOLIN HFA  Inhale 2 puffs into the lungs every 6 (six) hours as needed for wheezing or shortness of breath.     amoxicillin 500 MG tablet  Commonly known as:  AMOXIL  Take 1 tablet (500 mg total) by mouth 2 (two) times daily.     ciprofloxacin 750 MG tablet  Commonly known as:  CIPRO  Take 1 tablet (750 mg total) by mouth 2 (two) times daily.     clonazePAM 1 MG tablet  Commonly known as:  KLONOPIN  Take one tablet per tube three times daily as needed for anxiety     COMBIVENT RESPIMAT 20-100 MCG/ACT Aers respimat  Generic drug:  Ipratropium-Albuterol  Inhale 1 puff into the lungs every 6 (six) hours as needed for wheezing or shortness of breath ((May take up to 6 puffs daily if needed.).     diclofenac sodium 1 % Gel  Commonly known as:  VOLTAREN  Apply 2 g topically 4 (four) times daily as needed (knee pain).     feeding supplement (JEVITY 1.2 CAL) Liqd  Place 1,000 mLs into feeding tube continuous.     Fluticasone-Salmeterol 100-50 MCG/DOSE Aepb  Commonly known as:  ADVAIR DISKUS  Inhale 1 puff into the lungs 2 (two) times daily.     gemfibrozil 600 MG tablet  Commonly known as:  LOPID  Take 1 tablet (600 mg total) by mouth 2 (two) times daily.     hydrOXYzine 25 MG tablet  Commonly known as:  ATARAX/VISTARIL  Take 1 tablet  (25 mg total) by mouth 2 (two) times daily as needed for itching.     levETIRAcetam 500 MG tablet  Commonly known as:  KEPPRA  Take 1 tablet (500 mg total) by mouth 2 (two) times daily.     metFORMIN 500 MG tablet  Commonly known as:  GLUCOPHAGE  Take 1 tablet (500 mg total) by mouth 2 (two) times daily with a meal.     metroNIDAZOLE 500 MG tablet  Commonly known as:  FLAGYL  Take 1 tablet (500 mg total) by mouth 3 (three) times daily.     multivitamin with minerals Tabs tablet  Take 1 tablet by mouth daily.     nicotine 14 mg/24hr patch  Commonly known as:  NICODERM CQ - dosed in mg/24 hours  Place 1 patch (14 mg total) onto the skin daily.     nicotine 7 mg/24hr patch  Commonly known as:  NICODERM CQ - dosed in mg/24 hr  Place 1 patch (7 mg total) onto the skin daily.  Start taking on:  02/01/2014     pantoprazole 40 MG tablet  Commonly known as:  PROTONIX  Take 40 mg by mouth 2 (two) times daily.     PARoxetine 20 MG tablet  Commonly known as:  PAXIL  Take 1 tablet (  20 mg total) by mouth daily.     QUEtiapine 50 MG tablet  Commonly known as:  SEROQUEL  Take 50 mg by mouth at bedtime. For 2 weeks then d/c.     trypsin-balsam-castor oil ointment  Commonly known as:  XENADERM  Apply 1 application topically 3 (three) times daily. Apply to right and left buttock every shift and as needed.     vitamin A & D ointment  Apply 1 application topically 2 (two) times daily. Apply to bilateral lower extremities and feet.     warfarin 3 MG tablet  Commonly known as:  COUMADIN  Take 1 tablet (3 mg total) by mouth daily. Daily INR check and daily dosing per pharmacy until INR therapeutic        Disposition and follow-up:   Mr.Danzig R Haff was discharged from [facility] in Stable condition.  At the hospital follow up visit please address:  1.  HCAP: Patient developed URI symptoms and shortness of breath despite being on ciprofloxacin, flagyl and amoxicillin  as an outpatient (for his brain abscesses). On CXR, atelectasis vs lingular PNA. Please follow for resolution of symptoms as patient left AMA prior to resolution of symptoms (he was treated with vanc/zosyn in the hospital for the 2 days he was hospitalized). Patient was to resume home antibiotics at discharge. Follows with Dr. Baxter Flattery for brain abscesses.  PEG Tube Removal: Patient's dysphagia has resolved. PEG was initially to be removed at SNF (patient also left AMA from SNF); then was to be removed on this hospitalization, but could not be done by IR on holiday (1/18); patient left AMA prior to removal.  2.  Labs / imaging needed at time of follow-up: INR (needs close follow up)  3.  Pending labs/ test needing follow-up: none  Follow-up Appointments: Follow-up Information    Follow up with [facility].   Why:  HHRN,aide and social work   Sport and exercise psychologist information:   [address] [place] [address] [phone]       Discharge Instructions: Continue to take prior antibiotics (flagyl, ciprofloxacin and amoxicillin) as prescribed. It is critical that you take these medications!  Consultations:  none  Procedures Performed:  Dg Chest 1 View  01/15/2014   CLINICAL DATA:  Wheezing and cough  EXAM: CHEST - 1 VIEW  COMPARISON:  01/01/2014  FINDINGS: Cardiac shadow is within normal limits. A right-sided PICC line is again seen and stable the lungs are again well aerated. The previously seen cavitary lesion has nearly completely resolved in the interval. Old rib fractures are again seen on the left. Old left clavicular fracture is again noted. No new focal infiltrate is seen.  IMPRESSION: Near-complete resolution of previously seen cavitary lesion. No acute abnormality noted.   Electronically Signed   By: Inez Catalina M.D.   On: 01/15/2014 15:51   Dg Chest 2 View  01/28/2014   CLINICAL DATA:  Two day history of shortness of breath. Follow-up lingular pneumonia. Current  history of diabetes and hypertension. Current smoker. Left-sided pulmonary embolus on 01/02/2014.  EXAM: CHEST  2 VIEW  COMPARISON:  Two-view chest x-ray yesterday dating back to 01/26/2012. CTA chest 01/02/2014.  FINDINGS: Cardiac silhouette normal in size, unchanged. Thoracic aorta atherosclerotic, unchanged. Insert or otherwise streaky and patchy airspace opacities in the lingula, with minimal improvement in aeration since yesterday. No new pulmonary parenchymal abnormalities. Emphysematous changes throughout both lungs, particularly the upper lobes, unchanged. No pleural effusions. Osseous demineralization with compression  fracture of the upper endplate of T5 on the order of 20% or so which is not acute. Old healed fractures involving left upper ribs.  IMPRESSION: 1. Minimal improvement in the lingular pneumonia since yesterday. 2. No new abnormalities.  Baseline COPD/emphysema.   Electronically Signed   By: Evangeline Dakin M.D.   On: 01/28/2014 13:24   Dg Chest 2 View  01/27/2014   CLINICAL DATA:  54 year old male with central chest pain beginning at 8 a.m. this morning and shortness of breath beginning yesterday. Active pack per day smoker.  EXAM: CHEST  2 VIEW  COMPARISON:  Prior chest x-ray 01/15/2014  FINDINGS: The patient is rotated toward the left. Interval removal of right upper extremity PICC. Stable cardiac and mediastinal contours a given limitations of rotated positioning. Atherosclerotic calcifications are present within the aorta. New and nonspecific lingular opacity which could reflect atelectasis or infiltrate. The remainder the lungs are mildly hyperinflated with a central bronchitic change and interstitial prominence consistent with underlying COPD.  IMPRESSION: New patchy airspace opacity in the lingula concerning for bronchopneumonia.   Electronically Signed   By: Jacqulynn Cadet M.D.   On: 01/27/2014 12:32   Ct Head Wo Contrast  01/03/2014   CLINICAL DATA:  Evaluate brain  abscess from 11/24. Subsequent encounter.  EXAM: CT HEAD WITHOUT CONTRAST  TECHNIQUE: Contiguous axial images were obtained from the base of the skull through the vertex without intravenous contrast.  COMPARISON:  Head CT -12/06/2013; 12/04/2013 ; brain MRI - 12/04/2013  FINDINGS: Interval evolution of previously identified intraparenchymal brain abscesses.  Interval decrease in size of the intraparenchymal abscess within the high left parietal lobe now measuring 1.8 x 2.0 cm (image 28, series 2), previously, 2.5 x 2.17 m with associated resolve pneumocephalus and minimal amount of adjacent hemorrhage.  The ill-defined abscess within the right centrum semi ovale has decreased in size in interval, now measuring approximately 1.8 x 1.7 cm (image 22), previously, 2.3 x 2.0 cm). The ill-defined abscess within the right basal ganglia has decreased in size, now measuring 2.2 x 1.4 cm (image 16, series 2), previously, 2.7 x 2.7 cm.  The amount of vasogenic edema surrounding these intraparenchymal abscess ease has significantly decreased in the interval with associated now a very minimal amount of right to left midline shift, measuring 2 mm in diameter, previously, 6 mm.  No new definitive intraparenchymal or extra-axial mass or hemorrhage. Normal size and configuration of the ventricles and basilar cisterns. Intracranial atherosclerosis.  Unchanged burr holes about the right frontoparietal calvarium and high a left parietal calvarium with associated overlying surgical staples. Regional soft tissues appear normal. Paranasal sinuses and mastoid air cells are normally aerated. No air-fluid levels.  IMPRESSION: 1. Continued improvement in previously noted cerebral abscesses with decreased vasogenic edema and mass effect. 2. No definitive superimposed acute intracranial process.   Electronically Signed   By: Sandi Mariscal M.D.   On: 01/03/2014 15:56   Ct Angio Chest Pe W/cm &/or Wo Cm  01/02/2014   CLINICAL DATA:  Acute onset  of generalized chest pain and tachycardia. Initial encounter.  EXAM: CT ANGIOGRAPHY CHEST WITH CONTRAST  TECHNIQUE: Multidetector CT imaging of the chest was performed using the standard protocol during bolus administration of intravenous contrast. Multiplanar CT image reconstructions and MIPs were obtained to evaluate the vascular anatomy.  CONTRAST:  133mL OMNIPAQUE IOHEXOL 350 MG/ML SOLN  COMPARISON:  Chest radiograph performed 01/01/2014, and CT of the chest performed 12/01/2013  FINDINGS: A small amount  of nonocclusive pulmonary embolus is noted within the left main pulmonary artery, extending distally into the left upper and lower lobes.  There has been interval cavitation within the right middle lobe, with chronic soft tissue thickening and minimal residual opacity, at the site of the patient's prior pneumonia. Minimal airspace opacity is also noted within the superior aspect of the left lower lobe. There is no evidence of pleural effusion or pneumothorax. No masses are identified; no abnormal focal contrast enhancement is seen.  Scattered coronary artery calcification is noted. The mediastinum is otherwise unremarkable. No mediastinal lymphadenopathy is seen. No pericardial effusion is identified. The great vessels are grossly unremarkable in appearance. No axillary lymphadenopathy is seen. The visualized portions of the thyroid gland are unremarkable in appearance.  The visualized portions of the liver and spleen are unremarkable.  No acute osseous abnormalities are seen. Chronic left-sided rib deformities and left clavicular deformity are again noted.  Review of the MIP images confirms the above findings.  IMPRESSION: 1. Small amount of nonocclusive pulmonary embolus within the left main pulmonary artery, extending distally into the left upper and lower lobes. 2. Interval cavitation at the right middle lobe, with chronic soft tissue thickening and minimal residual airspace opacity at the site of the  patient's prior pneumonia. Minimal residual airspace opacity also noted at the superior aspect of the left lower lobe, concerning for residual infection.  Critical Value/emergent results were called by telephone at the time of interpretation on 01/02/2014 at 5:35 am to Dr. Rolland Porter, who verbally acknowledged these results.   Electronically Signed   By: Garald Balding M.D.   On: 01/02/2014 05:39   US Venous Img Lower Bilateral  01/02/2014   CLINICAL DATA:  Small bilateral pulmonary emboli by CTA earlier today.  EXAM: BILATERAL LOWER EXTREMITY VENOUS DOPPLER ULTRASOUND  TECHNIQUE: Gray-scale sonography with graded compression, as well as color Doppler and duplex ultrasound were performed to evaluate the lower extremity deep venous systems from the level of the common femoral vein and including the common femoral, femoral, profunda femoral, popliteal and calf veins including the posterior tibial, peroneal and gastrocnemius veins when visible. The superficial great saphenous vein was also interrogated. Spectral Doppler was utilized to evaluate flow at rest and with distal augmentation maneuvers in the common femoral, femoral and popliteal veins.  COMPARISON:  12/2020 chest CTA  FINDINGS: RIGHT LOWER EXTREMITY  Common Femoral Vein: No evidence of thrombus. Normal compressibility, respiratory phasicity and response to augmentation.  Saphenofemoral Junction: No evidence of thrombus. Normal compressibility and flow on color Doppler imaging.  Profunda Femoral Vein: No evidence of thrombus. Normal compressibility and flow on color Doppler imaging.  Femoral Vein: No evidence of thrombus. Normal compressibility, respiratory phasicity and response to augmentation.  Popliteal Vein: No evidence of thrombus. Normal compressibility, respiratory phasicity and response to augmentation.  Calf Veins: No evidence of thrombus. Normal compressibility and flow on color Doppler imaging.  Superficial Great Saphenous Vein: No evidence of  thrombus. Normal compressibility and flow on color Doppler imaging.  Venous Reflux:  None.  Other Findings:  None.  LEFT LOWER EXTREMITY  Common Femoral Vein: No evidence of thrombus. Normal compressibility, respiratory phasicity and response to augmentation.  Saphenofemoral Junction: No evidence of thrombus. Normal compressibility and flow on color Doppler imaging.  Profunda Femoral Vein: No evidence of thrombus. Normal compressibility and flow on color Doppler imaging.  Femoral Vein: No evidence of thrombus. Normal compressibility, respiratory phasicity and response to augmentation.  Popliteal Vein: No evidence of  thrombus. Normal compressibility, respiratory phasicity and response to augmentation.  Calf Veins: No evidence of thrombus. Normal compressibility and flow on color Doppler imaging.  Superficial Great Saphenous Vein: No evidence of thrombus. Normal compressibility and flow on color Doppler imaging.  Venous Reflux:  None.  Other Findings:  None.  IMPRESSION: No evidence of deep venous thrombosis.   Electronically Signed   By: Daryll Brod M.D.   On: 01/02/2014 15:07   Dg Chest Portable 1 View  01/02/2014   CLINICAL DATA:  Chest pain, right middle lobe pneumonia November 2015  EXAM: PORTABLE CHEST - 1 VIEW  COMPARISON:  11/30/2013  FINDINGS: There is near complete resolution of the previously seen right middle lobe airspace opacity with central lucency. Right-sided PICC line in place with tip over the cavoatrial junction. Heart size is normal. Left lung is clear. No acute osseous finding. Cervical fusion hardware partly visualized.  IMPRESSION: Near complete resolution of right middle lobe airspace opacity compatible with previously diagnosed pneumonia. Radiographic resolution may be prolonged in patients with underlying lung disease. Follow-up chest radiograph is recommended in 4 weeks. No new acute abnormality.   Electronically Signed   By: Conchita Paris M.D.   On: 01/02/2014 00:19    Admission  HPI: Mr. Linden Tagliaferro is a 54 yo man with a history of CVA, PVD, COPD (emphysema), tobacco and IV heroin abuse, grade I diastolic CHG, hepatitis C, remote TB, DMII and tobacco abuse who is presenting with cough, shortness of breath and chest pain for the past two days. His cough is dry and accompanied by congestion and shortness of breath. He denies chills, fever, sweats, dizziness, nausea, pain in his lower extremities or pain on breathing deeply. This morning, he also noted some chest pain in the center of his chest that was sharp, achy and constant. It has subsided.   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 A LUL lung nodule was also noted on chest CT at the beginning of the hospitalization. He had dysphagia and proceeded to fail two swallow evaluations over the course of his stay and a PEG was placed. He was managed with IV antibiotics and decadron and ultimately discharged to SNF. At the SNF, he became short of breath and was readmitted with a PE. Repeat CT at that time showed improvement of the brain abscesses and he returned to SNF. It was difficult for him to maintain his warfarin anticoagulation therapy and was noted to be aggressive at SNF. ID removed his PICC at SNF and changed him to oral antibiotics. By the end of his time at the SNF, he was eating by mouth and it was recommended that he have his PEG removed prior to leaving. He ultimately left AMA before removal of the PEG, on 01/17/13, because it felt like "prison" and presented to [facility] for follow up the next day.   Hospital Course by problem list: Active Problems:   HCAP (healthcare-associated pneumonia)   Mr. Steppe is a 30 yo man with a complicated recent medical history who presented with symptoms of URI and shortness of breath on 01/27/14 and was found on CXR to have a patchy opacity  in the lingula concerning for bronchopneumonia.   HCAP: Given his multiple recent hospitalizations, HCAP treatment was initiated. However, it would be unlikely for him to develop pneumonia on his  current outpatient antibiotic regimen (amoxicillin, flagyl, ciprofloxacin). He may have developed atelectasis that led to his symptoms, or he may have been noncompliant with his medications. His supratherapeutic INR makes noncompliance less likely. Vancomycin and zosyn were given in the hospital and the patient was advised to take his home antibiotics at home when he left AMA, along with using his incentive spirometer (provided to him when he left). Influenza and blood cultures negative.  Chest Pain: Some possible EKG changes. Pain likely related to HCAP. No longer present. May have been secondary to atelectasis or HCAP.  Brain Abscesses: Follows with Dr. Baxter Flattery. Has been managed as outpatient on amoxicillin, metronidazole and ciprofloxacin. ALso on keppra for seizure prophylaxis. Has follow up with neurosurgery.  History of PE: On warfarin with initial INR 3.03. He is on many medications that may affect his INR, so he needs close follow up of this.Followed by Dr. Elie Confer as outpatient.  DM2: Last A1c 7.0%. On metformin 500 mg BID.  Herniated Lumbar Disk with Radiculopathy: He was receiving IV narcotics in his SNF. Given his extensive history of drug abuse and recent IV heroin abuse, this is very concerning. Has Tylenol #3 for his pain 1-2 Q8PRN #120 for the month at home. Needs to be weaned.  Lung Nodule Visualized on Chest CT: CT Chest 11/20 shows 3.6 cm nodule in LUL, now thought to be apical scar, 1 month later CT angio did not show this nodule. Consider CT follow up in 1 year.  Resolved Dysphagia: Started during initial hospitalization and has fully resolved. Eating without any issues. PEG was to be removed at SNF, but patient left AMA.  History of [place] CVA: In 2010. Followed by Dr.  [name].  Anxiety: Plan to continue Paxil 20 mg by mouth daily, then continue home Klonopin 1mg  by mouth TID for the next month with plan to wean off next month.  Tobacco Abuse: Has been smoking 1 ppd and used nicotine patch in the hospital.  Discharge Vitals:   BP 133/76 mmHg  Pulse 77  Temp(Src) 97.8 F (36.6 C) (Oral)  Resp 15  Ht 6\' 1"  (1.854 m)  Wt 129 lb 10.1 oz (58.8 kg)  BMI 17.11 kg/m2  SpO2 100%  Discharge Labs:  Results for orders placed or performed during the hospital encounter of 01/27/14 (from the past 24 hour(s))  Glucose, capillary     Status: Abnormal   Collection Time: 01/28/14  5:08 PM  Result Value Ref Range   Glucose-Capillary 111 (H) 70 - 99 mg/dL  Glucose, capillary     Status: Abnormal   Collection Time: 01/28/14 11:21 PM  Result Value Ref Range   Glucose-Capillary 221 (H) 70 - 99 mg/dL   Comment 1 Notify RN   Protime-INR     Status: Abnormal   Collection Time: 01/29/14  6:51 AM  Result Value Ref Range   Prothrombin Time 30.2 (H) 11.6 - 15.2 seconds   INR 2.85 (H) 0.00 - 1.49  Glucose, capillary     Status: Abnormal   Collection Time: 01/29/14 11:34 AM  Result Value Ref Range   Glucose-Capillary 130 (H) 70 - 99 mg/dL    Signed: Drucilla Schmidt, MD 01/29/2014, 12:41 PM    Services Ordered on Discharge: none; patient left AMA Equipment Ordered on Discharge: none, patient left AMA

## 2014-01-29 NOTE — Care Management Note (Signed)
    Page 1 of 1   01/29/2014     5:48:00 PM CARE MANAGEMENT NOTE 01/29/2014  Patient:  [name], [name]   Account Number:  [id number]  Date Initiated:  01/29/2014  Documentation initiated by:  Tomi Bamberger  Subjective/Objective Assessment:   dx hcap  patient active with [facility] for [facility], aide     Action/Plan:   Anticipated DC Date:  01/29/2014   Anticipated DC Plan:  [place]  CM consult      [facility] Choice  Resumption Of Svcs/PTA Provider   Choice offered to / List presented to:  C-1 Patient        [place] arranged  HH-1 RN  [place].   Status of service:  Completed, signed off Medicare Important Message given?  NO (If response is "NO", the following Medicare IM given date fields will be blank) Date Medicare IM given:   Medicare IM given by:   Date Additional Medicare IM given:   Additional Medicare IM given by:    Discharge Disposition:  [place]  Per UR Regulation:  Reviewed for med. necessity/level of care/duration of stay  If discussed at [place] of Stay Meetings, dates discussed:    Comments:  01/29/14 [place], BSN [phone] patient is active with [facility] for [facility] , aide and sW,  will resume ,  NCM notified [facility] that patient was leaving AMA today.

## 2014-01-29 NOTE — Progress Notes (Signed)
While helping patient with dressing to get ready to leave AMA, he made the comment that if he ever got to the point of needing someone else to bathe him he would "Shoot his brains out". MD notified.

## 2014-01-30 ENCOUNTER — Telehealth: Payer: Self-pay | Admitting: Pharmacist

## 2014-01-30 NOTE — Telephone Encounter (Signed)
At 1649h on Tuesday 19-JAN-16 I have not heard from [place] regarding today's scheduled INR collection. Patient states he was discharged from hospital yesterday. He had been instructed to take 1/2 x 3mg  (1.5mg ) warfarin YESTERDAY. I spoke with him earlier today and he advised he OMITTED YESTERDAY'S DOSE. I advised him to TAKE YESTERDAY's 1/2 x 3mg  (1.5mg ) tablet at that time. I am instructing him in absence of provided INR by [facility] RN to take another 1/2x3mg  (1.5mg ) warfarin dose today--empirically, for total of 3mg  warfarin for today, Tuesday 19-JAN-16. Will make effort at reaching [facility] RN to clarify that an order was sent by discharging team when patient left hospital. Patient reports no bleeding symptoms or signs. Patient verbalized understanding of instruction(s) provided by phone.

## 2014-01-31 ENCOUNTER — Telehealth: Payer: Self-pay | Admitting: *Deleted

## 2014-01-31 ENCOUNTER — Other Ambulatory Visit: Payer: Self-pay | Admitting: Internal Medicine

## 2014-01-31 ENCOUNTER — Telehealth: Payer: Self-pay | Admitting: Pharmacist

## 2014-01-31 ENCOUNTER — Telehealth: Payer: Self-pay | Admitting: Internal Medicine

## 2014-01-31 DIAGNOSIS — I2699 Other pulmonary embolism without acute cor pulmonale: Secondary | ICD-10-CM

## 2014-01-31 MED ORDER — ENOXAPARIN SODIUM 30 MG/0.3ML ~~LOC~~ SOLN: 1.0000 mg/kg | Freq: Two times a day (BID) | SUBCUTANEOUS | Status: DC

## 2014-01-31 NOTE — Telephone Encounter (Signed)
Relayed to pharm, will speak w/ dr Elie Confer

## 2014-01-31 NOTE — Telephone Encounter (Signed)
Patient states he did not take Monday's dose of warfarin as he had been instructed. He shared this information with me on Tuesday 19-JAN-16.

## 2014-01-31 NOTE — Telephone Encounter (Signed)
Pharmacy called to clarify dose.  Lovenox 60 mg BID prescribed (two 30 mg syringes).  They have 60 mg syringes.  We therefore approve the change to 60 mg BID for 5 doses (thus, 5 syringes).

## 2014-01-31 NOTE — Telephone Encounter (Signed)
Call from [facility] with [facility] - # [phone]  Nurse reports labs from today : INR 1.4 and PT 16.8  Talked with Dr Lynnae January and she ordered Lovenox 60 mg every 12 hours.  Will hold Friday's dose until INR is called to Dr Elie Confer. [facility] will deliver med to home and [facility] will administer and teach brother how to do injections.  [facility] is still at pt's house so both are aware of plan.

## 2014-01-31 NOTE — Telephone Encounter (Signed)
Call from [facility] with [facility] - # [phone]  Nurse is requesting PT evaluation for pt.  Will you give the verbal order for this? Also pt has a feeding tube that was used in the nursing home.  Does he still need it?

## 2014-01-31 NOTE — Telephone Encounter (Signed)
Patient was provided dosing instructions on Monday 18-JAN-16 to take 1/2 x 3mg  (1.5mg ) warfarin. He was provided instructions on Tuesday 19-JAN-16 similarly to take 1/2 x 3mg  (1.5mg ) as well. INR today 1.4  [facility] RN was advised to tell the patient to take 4.5mg  (1 & 1/2 x 3mg ) today 20-JAN-16 and on 21-JAN-16 as well. She is to recollect INR on Friday 22-JAN-16 and call me with results. I have discussed this case with Dr. Lynnae January and we both agree that since initial index VTE event was diagnosed in December--we should provide LMWH enoxaparin 1mg /kg SQ q12h for today and Thursday. Dr. Lynnae January has spoken with the Triage Nurse at [facility] who is arranging with [facility] to have LMWH injected by the [facility] RN for two doses today and tomorrow. A total of 5 doses will be provided. Dose for Friday will be contingent upon INR on Friday which is to be collected early Friday morning.

## 2014-01-31 NOTE — Assessment & Plan Note (Signed)
New PE 12/22. INR subtherapeutic. Dr Elie Confer mgming warfarin. Discussed with Dr Elie Confer and Edd Fabian. [facility] to admin lovenox until and inc Thur PM dose. INR to be checked Friday early AM by [facility] and called to Dr Elie Confer. Friday AM lovenox not to be admin until [facility] contacted by Dr Elie Confer.   Pt weighs just under 60 kg. 1 mg / kg dose. 60 mg or two syringes per dose. Total 10 syringes (5 doses) Rx'd.

## 2014-02-01 ENCOUNTER — Encounter: Payer: Self-pay | Admitting: Internal Medicine

## 2014-02-01 ENCOUNTER — Ambulatory Visit (INDEPENDENT_AMBULATORY_CARE_PROVIDER_SITE_OTHER): Payer: Medicaid Other | Admitting: Internal Medicine

## 2014-02-01 ENCOUNTER — Telehealth: Payer: Self-pay | Admitting: *Deleted

## 2014-02-01 VITALS — BP 135/91 | HR 128 | Temp 97.4°F | Ht 73.0 in | Wt 124.1 lb

## 2014-02-01 DIAGNOSIS — I2699 Other pulmonary embolism without acute cor pulmonale: Secondary | ICD-10-CM

## 2014-02-01 DIAGNOSIS — J189 Pneumonia, unspecified organism: Secondary | ICD-10-CM

## 2014-02-01 DIAGNOSIS — F17210 Nicotine dependence, cigarettes, uncomplicated: Secondary | ICD-10-CM

## 2014-02-01 DIAGNOSIS — G060 Intracranial abscess and granuloma: Secondary | ICD-10-CM

## 2014-02-01 DIAGNOSIS — B954 Other streptococcus as the cause of diseases classified elsewhere: Secondary | ICD-10-CM

## 2014-02-01 DIAGNOSIS — IMO0001 Reserved for inherently not codable concepts without codable children: Secondary | ICD-10-CM

## 2014-02-01 DIAGNOSIS — R911 Solitary pulmonary nodule: Secondary | ICD-10-CM

## 2014-02-01 DIAGNOSIS — Z7901 Long term (current) use of anticoagulants: Secondary | ICD-10-CM

## 2014-02-01 DIAGNOSIS — Y95 Nosocomial condition: Secondary | ICD-10-CM

## 2014-02-01 DIAGNOSIS — R197 Diarrhea, unspecified: Secondary | ICD-10-CM

## 2014-02-01 LAB — BASIC METABOLIC PANEL WITH GFR
BUN: 14 mg/dL (ref 6–23)
CALCIUM: 10.1 mg/dL (ref 8.4–10.5)
CO2: 25 mEq/L (ref 19–32)
CREATININE: 0.67 mg/dL (ref 0.50–1.35)
Chloride: 107 mEq/L (ref 96–112)
GFR, Est African American: >89 mL/min
GLUCOSE: 139 mg/dL — AB (ref 70–99)
POTASSIUM: 3.8 meq/L (ref 3.5–5.3)
Sodium: 144 mEq/L (ref 135–145)

## 2014-02-01 LAB — POCT INR: INR: 1.6

## 2014-02-01 MED ORDER — AMOXICILLIN-POT CLAVULANATE 875-125 MG PO TABS: 1.0000 | ORAL_TABLET | Freq: Two times a day (BID) | ORAL | Status: AC

## 2014-02-01 MED ORDER — RIVAROXABAN 20 MG PO TABS: 20.0000 mg | ORAL_TABLET | Freq: Every day | ORAL | Status: DC
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
## 2014-02-01 NOTE — Assessment & Plan Note (Signed)
2 days of watery diarrhea in setting of healthcare exposure and very broad spectrum Abx. -Given stool kit for C. Diff testing - If positive will need 2 weeks of Oral Vancomycin (discussed with dr hatcher) - If negative can consider probiotic for antibiotic associated diarrhea but evidence for this is poor.

## 2014-02-01 NOTE — Telephone Encounter (Signed)
Pt called stating he thinks his pneumonia has returned and he wanted to know what to take for it.  He is c/o  nausea, chest hurting with cough.  Non- productive cough.  Not taking any OTC meds.  Still on antibiotics ( Amoxil and cipro ) Onset yesterday.  Not sure if fever. Drinking okay.  Hospital discharge 1/18 for  HCAP (healthcare-associated pneumonia. We have clinic opening today   Please advise Pt # [phone]

## 2014-02-01 NOTE — Assessment & Plan Note (Addendum)
-   Discussed case over the telephone with Dr. Johnnye Sima (ID), given that we do have culture data of Microaerophilic strep he recommends that we narrow Abx (especially given his diarreha) to Augmentin. - Start Augmentin 875-125 BID - We can repeat a CT head for follow up in a week. We may be able to consider stopping Abx at that time if he has seen dentist to have teeth pulled.

## 2014-02-01 NOTE — Progress Notes (Signed)
Internal Medicine Clinic Attending  Case discussed with Dr. Hoffman at the time of the visit.  We reviewed the resident's history and exam and pertinent patient test results.  I agree with the assessment, diagnosis, and plan of care documented in the resident's note.  

## 2014-02-01 NOTE — Assessment & Plan Note (Signed)
-  INR again subtheraputic, very difficult to manage given ABx and other medications. -Discussed with Dr. Elie Confer who agrees that a NOAC may be a better alternative. - Is over 21 days out of inciting event.  Will start Xarelto 20mg  daily with largest meal.  I discussed Xarelto and our protocol to try to reverse at [facility] if he were to have a life threatening bleed, he is agreeable.  Will also try to have our pharmacist (Dr. Maudie Mercury) check in with him if he has any questions.

## 2014-02-01 NOTE — Assessment & Plan Note (Signed)
Very difficult to maintain INR. Discussed with Dr. Elie Confer. - Will change to Xarelto.

## 2014-02-01 NOTE — Patient Instructions (Signed)
General Instructions: Please start taking 20mg  of Xarelto once a day with largest meal.  Stop taking Warfain and Lovenox injections   Start Amoxicillin 875-125mg  twice a day and stop Amoxicillin, Cipro and Flagyl.  Please bring your medicines with you each time you come to clinic.  Medicines may include prescription medications, over-the-counter medications, herbal remedies, eye drops, vitamins, or other pills.   Progress Toward Treatment Goals:  Treatment Goal 09/26/2012  Hemoglobin A1C at goal  Blood pressure at goal  Stop smoking smoking less  Prevent falls (No Data)    Self Care Goals & Plans:  Self Care Goal 02/01/2014  Manage my medications take my medicines as prescribed; bring my medications to every visit; refill my medications on time  Monitor my health -  Eat healthy foods drink diet soda or water instead of juice or soda; eat more vegetables; eat foods that are low in salt; eat baked foods instead of fried foods; eat fruit for snacks and desserts  Be physically active -  Stop smoking -  Meeting treatment goals -    Home Blood Glucose Monitoring 09/26/2012  Check my blood sugar no home glucose monitoring  When to check my blood sugar N/A     Care Management & Community Referrals:  Referral 09/26/2012  Referrals made for care management support none needed  Referrals made to community resources none

## 2014-02-01 NOTE — Assessment & Plan Note (Signed)
-  Doubt true PNA given he was on very broad spectrum antibiotics.  He will monitor himself for fever and worsening of his symptoms. - See Brain Abscess A&P for change in Abx (change to Augmentin) - Advised to resume IS.

## 2014-02-01 NOTE — Telephone Encounter (Signed)
He can be seen in clinic this afternoon. Thanks.

## 2014-02-02 LAB — CBC WITH DIFFERENTIAL/PLATELET
BASOS ABS: 0.1 10*3/uL (ref 0.0–0.1)
Basophils Relative: 1 % (ref 0–1)
EOS PCT: 2 % (ref 0–5)
Eosinophils Absolute: 0.2 10*3/uL (ref 0.0–0.7)
HCT: 38.4 % — ABNORMAL LOW (ref 39.0–52.0)
Hemoglobin: 13.3 g/dL (ref 13.0–17.0)
Lymphocytes Relative: 37 % (ref 12–46)
Lymphs Abs: 3.7 10*3/uL (ref 0.7–4.0)
MCH: 28.8 pg (ref 26.0–34.0)
MCHC: 34.6 g/dL (ref 30.0–36.0)
MCV: 83.1 fL (ref 78.0–100.0)
MPV: 9.7 fL (ref 8.6–12.4)
Monocytes Absolute: 0.7 10*3/uL (ref 0.1–1.0)
Monocytes Relative: 7 % (ref 3–12)
NEUTROS PCT: 53 % (ref 43–77)
Neutro Abs: 5.3 10*3/uL (ref 1.7–7.7)
Platelets: 412 10*3/uL — ABNORMAL HIGH (ref 150–400)
RBC: 4.62 MIL/uL (ref 4.22–5.81)
RDW: 22.5 % — ABNORMAL HIGH (ref 11.5–15.5)
WBC: 10 10*3/uL (ref 4.0–10.5)

## 2014-02-03 LAB — CULTURE, BLOOD (ROUTINE X 2)
CULTURE: NO GROWTH
Culture: NO GROWTH

## 2014-02-05 ENCOUNTER — Telehealth: Payer: Self-pay | Admitting: *Deleted
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
## 2014-02-05 NOTE — Telephone Encounter (Signed)
Patient called and advised that he went to the doctor because the Cipro, Flagyl and Amoxil he was taking were making him sick he could not eat and he was having sever dirrhea. The doctor stopped all of them and put the patient on Augmentin 825-125 and the patient wants to know if that is ok. He advised he does feel better but he wants to know that his infection is clearing up. Advised the patient will call the doctor and let her know and call him back once I get an answer.

## 2014-02-06 ENCOUNTER — Ambulatory Visit: Payer: Self-pay | Admitting: Internal Medicine

## 2014-02-06 ENCOUNTER — Telehealth: Payer: Self-pay | Admitting: Licensed Clinical Social Worker

## 2014-02-06 NOTE — Telephone Encounter (Signed)
Switch is ok

## 2014-02-06 NOTE — Telephone Encounter (Signed)
Dr Sherrine Maples called [facility]

## 2014-02-06 NOTE — Telephone Encounter (Signed)
Patient called wanting to talk to Dr. Baxter Flattery, he said it was urgent and wouldn't discuss it with me.

## 2014-02-06 NOTE — Telephone Encounter (Signed)
Will call him today

## 2014-02-07 ENCOUNTER — Encounter: Payer: Self-pay | Admitting: Internal Medicine

## 2014-02-07 ENCOUNTER — Ambulatory Visit (INDEPENDENT_AMBULATORY_CARE_PROVIDER_SITE_OTHER): Payer: Medicaid Other | Admitting: Internal Medicine

## 2014-02-07 ENCOUNTER — Ambulatory Visit (HOSPITAL_COMMUNITY)
Admission: RE | Admit: 2014-02-07 | Discharge: 2014-02-07 | Disposition: A | Discharge status: Home / Self Care | Payer: Medicaid Other | Source: Ambulatory Visit | Attending: Internal Medicine | Admitting: Internal Medicine

## 2014-02-07 ENCOUNTER — Other Ambulatory Visit: Payer: Self-pay | Admitting: Internal Medicine

## 2014-02-07 VITALS — BP 140/69 | HR 114 | Temp 97.3°F | Ht 73.0 in | Wt 130.5 lb

## 2014-02-07 DIAGNOSIS — R1310 Dysphagia, unspecified: Secondary | ICD-10-CM

## 2014-02-07 DIAGNOSIS — E118 Type 2 diabetes mellitus with unspecified complications: Secondary | ICD-10-CM

## 2014-02-07 DIAGNOSIS — Z4659 Encounter for fitting and adjustment of other gastrointestinal appliance and device: Secondary | ICD-10-CM | POA: Diagnosis not present

## 2014-02-07 DIAGNOSIS — E119 Type 2 diabetes mellitus without complications: Secondary | ICD-10-CM

## 2014-02-07 DIAGNOSIS — R531 Weakness: Secondary | ICD-10-CM

## 2014-02-07 DIAGNOSIS — I2699 Other pulmonary embolism without acute cor pulmonale: Secondary | ICD-10-CM

## 2014-02-07 DIAGNOSIS — Z7901 Long term (current) use of anticoagulants: Secondary | ICD-10-CM

## 2014-02-07 DIAGNOSIS — G060 Intracranial abscess and granuloma: Secondary | ICD-10-CM

## 2014-02-07 DIAGNOSIS — K219 Gastro-esophageal reflux disease without esophagitis: Secondary | ICD-10-CM

## 2014-02-07 LAB — GLUCOSE, CAPILLARY: GLUCOSE-CAPILLARY: 124 mg/dL — AB (ref 70–99)

## 2014-02-07 MED ORDER — RANITIDINE HCL 150 MG PO TABS: 150.0000 mg | ORAL_TABLET | Freq: Two times a day (BID) | ORAL | Status: DC | PRN

## 2014-02-07 MED ORDER — LIDOCAINE VISCOUS 2 % MT SOLN
OROMUCOSAL | Status: AC
  Filled 2014-02-07: qty 15

## 2014-02-07 NOTE — Patient Instructions (Signed)
General Instructions: Please continue your medications as prescribed.  I want you to take Protonix once a day every day.  Only take Zantac as needed for heartburn.  Please call and do not run out of Augmentin until we decide how long you need it for (I will need to talk with Dr. Baxter Flattery)  Please decrease clonidine to once a day.  Please bring your medicines with you each time you come to clinic.  Medicines may include prescription medications, over-the-counter medications, herbal remedies, eye drops, vitamins, or other pills.   Progress Toward Treatment Goals:  Treatment Goal 09/26/2012  Hemoglobin A1C at goal  Blood pressure at goal  Stop smoking smoking less  Prevent falls (No Data)    Self Care Goals & Plans:  Self Care Goal 02/07/2014  Manage my medications take my medicines as prescribed; bring my medications to every visit; refill my medications on time; follow the sick day instructions if I am sick  Monitor my health check my feet daily  Eat healthy foods eat more vegetables; eat fruit for snacks and desserts  Be physically active find an activity I enjoy  Stop smoking -  Meeting treatment goals -    Home Blood Glucose Monitoring 09/26/2012  Check my blood sugar no home glucose monitoring  When to check my blood sugar N/A     Care Management & Community Referrals:  Referral 09/26/2012  Referrals made for care management support none needed  Referrals made to community resources none

## 2014-02-08 NOTE — Assessment & Plan Note (Signed)
-   Diarrhea improved after change to Augmentin.  Per Dr. Storm Frisk note on 01/17/14 she wants to follow up with him in 4 weeks. - He apparently has had a dental evaluation and we will try to obtain records if possible, also asked Mr. Nicotra to call in with dentist name. - May need orthopantogram. - Patient will follow up with [place] on 02/19/14.  If he has not followed up with ID at that time we may need to repeat a CT head to evaluate progress and ongoing need of Abx.

## 2014-02-08 NOTE — Assessment & Plan Note (Signed)
-   Doing well now on xarelto.

## 2014-02-08 NOTE — Assessment & Plan Note (Signed)
Patient has been taking both Protonix and Ranitidine PRN. -Instructed him to take Protonix 40mg  daily and Ranitidine PRN.
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
## 2014-02-08 NOTE — Progress Notes (Signed)
Internal Medicine Clinic Attending  Case discussed with Dr. Hoffman soon after the resident saw the patient.  We reviewed the resident's history and exam and pertinent patient test results.  I agree with the assessment, diagnosis, and plan of care documented in the resident's note. 

## 2014-02-08 NOTE — Assessment & Plan Note (Signed)
Improving, and patient graining weight.  Wt Readings from Last 5 Encounters:  02/07/14 130 lb 8 oz (59.194 kg)  02/01/14 124 lb 1.6 oz (56.291 kg)  01/18/14 132 lb 9.6 oz (60.147 kg)  01/17/14 133 lb (60.328 kg)  01/01/14 120 lb (54.432 kg)

## 2014-02-08 NOTE — Assessment & Plan Note (Signed)
-   Did not bring meter, CBG good in office today. - Continue metfromin.

## 2014-02-13 ENCOUNTER — Telehealth: Payer: Self-pay | Admitting: *Deleted

## 2014-02-13 NOTE — Telephone Encounter (Signed)
Talked with pt and advised as instructed.  Also will ask Dr. Maudie Mercury to call pt. About side effects. He will continue taking as instructed.

## 2014-02-13 NOTE — Telephone Encounter (Signed)
Pt called stating he fells xarelto is making him feel sick.  He was started on Xarelto 1/21 .   He was seen in clinic on 1/27 and doing well.  Then Sunday he felt nauseated all day after taking med.  Monday he skipped a dose and felt much better.  Today he took Xarelto and felt sick again.  Pt # [id number] Please advise

## 2014-02-13 NOTE — Telephone Encounter (Signed)
Dr Elie Confer changed from warfarin to xarelto bc INR mgmt was challenging. Pt needs to take the Xarelto as prescribed and not miss a single dose. He may talk to Dr Elie Confer or Maudie Mercury about Xarelto side effects and whether a different, or resuming warfarin, is best option. But until then, he should not skip a single dose of xarelto.

## 2014-02-14 ENCOUNTER — Emergency Department (HOSPITAL_COMMUNITY): Payer: Medicaid Other

## 2014-02-14 ENCOUNTER — Emergency Department (HOSPITAL_COMMUNITY)
Admission: EM | Admit: 2014-02-14 | Discharge: 2014-02-14 | Disposition: A | Discharge status: Home / Self Care | Payer: Medicaid Other | Attending: Emergency Medicine | Admitting: Emergency Medicine

## 2014-02-14 ENCOUNTER — Telehealth: Payer: Self-pay | Admitting: *Deleted

## 2014-02-14 ENCOUNTER — Encounter (HOSPITAL_COMMUNITY): Payer: Self-pay | Admitting: Emergency Medicine

## 2014-02-14 DIAGNOSIS — Z8614 Personal history of Methicillin resistant Staphylococcus aureus infection: Secondary | ICD-10-CM | POA: Insufficient documentation

## 2014-02-14 DIAGNOSIS — Z95810 Presence of automatic (implantable) cardiac defibrillator: Secondary | ICD-10-CM | POA: Diagnosis not present

## 2014-02-14 DIAGNOSIS — J441 Chronic obstructive pulmonary disease with (acute) exacerbation: Secondary | ICD-10-CM | POA: Diagnosis not present

## 2014-02-14 DIAGNOSIS — Z7901 Long term (current) use of anticoagulants: Secondary | ICD-10-CM | POA: Diagnosis not present

## 2014-02-14 DIAGNOSIS — Z8611 Personal history of tuberculosis: Secondary | ICD-10-CM | POA: Insufficient documentation

## 2014-02-14 DIAGNOSIS — F410 Panic disorder [episodic paroxysmal anxiety] without agoraphobia: Secondary | ICD-10-CM | POA: Diagnosis not present

## 2014-02-14 DIAGNOSIS — R079 Chest pain, unspecified: Secondary | ICD-10-CM | POA: Diagnosis present

## 2014-02-14 DIAGNOSIS — Z8673 Personal history of transient ischemic attack (TIA), and cerebral infarction without residual deficits: Secondary | ICD-10-CM | POA: Diagnosis not present

## 2014-02-14 DIAGNOSIS — I471 Supraventricular tachycardia: Secondary | ICD-10-CM | POA: Insufficient documentation

## 2014-02-14 DIAGNOSIS — Z8619 Personal history of other infectious and parasitic diseases: Secondary | ICD-10-CM | POA: Insufficient documentation

## 2014-02-14 DIAGNOSIS — Z87448 Personal history of other diseases of urinary system: Secondary | ICD-10-CM | POA: Diagnosis not present

## 2014-02-14 DIAGNOSIS — I10 Essential (primary) hypertension: Secondary | ICD-10-CM | POA: Diagnosis not present

## 2014-02-14 DIAGNOSIS — Z951 Presence of aortocoronary bypass graft: Secondary | ICD-10-CM | POA: Insufficient documentation

## 2014-02-14 DIAGNOSIS — E119 Type 2 diabetes mellitus without complications: Secondary | ICD-10-CM | POA: Diagnosis not present

## 2014-02-14 DIAGNOSIS — E785 Hyperlipidemia, unspecified: Secondary | ICD-10-CM | POA: Insufficient documentation

## 2014-02-14 DIAGNOSIS — G894 Chronic pain syndrome: Secondary | ICD-10-CM | POA: Insufficient documentation

## 2014-02-14 DIAGNOSIS — Z79899 Other long term (current) drug therapy: Secondary | ICD-10-CM | POA: Insufficient documentation

## 2014-02-14 DIAGNOSIS — Z792 Long term (current) use of antibiotics: Secondary | ICD-10-CM | POA: Diagnosis not present

## 2014-02-14 DIAGNOSIS — Z7951 Long term (current) use of inhaled steroids: Secondary | ICD-10-CM | POA: Insufficient documentation

## 2014-02-14 DIAGNOSIS — K219 Gastro-esophageal reflux disease without esophagitis: Secondary | ICD-10-CM | POA: Diagnosis not present

## 2014-02-14 DIAGNOSIS — Z720 Tobacco use: Secondary | ICD-10-CM | POA: Insufficient documentation

## 2014-02-14 DIAGNOSIS — Z8781 Personal history of (healed) traumatic fracture: Secondary | ICD-10-CM | POA: Diagnosis not present

## 2014-02-14 DIAGNOSIS — J42 Unspecified chronic bronchitis: Secondary | ICD-10-CM

## 2014-02-14 LAB — URINE MICROSCOPIC-ADD ON

## 2014-02-14 LAB — BASIC METABOLIC PANEL
Anion gap: 9 (ref 5–15)
BUN: 13 mg/dL (ref 6–23)
CO2: 24 mmol/L (ref 19–32)
Calcium: 9.7 mg/dL (ref 8.4–10.5)
Chloride: 106 mmol/L (ref 96–112)
Creatinine, Ser: 0.77 mg/dL (ref 0.50–1.35)
GFR calc Af Amer: >90 mL/min (ref >90)
GFR calc non Af Amer: >90 mL/min (ref >90)
Glucose, Bld: 113 mg/dL — ABNORMAL HIGH (ref 70–99)
Potassium: 4.6 mmol/L (ref 3.5–5.1)
Sodium: 139 mmol/L (ref 135–145)

## 2014-02-14 LAB — CBC WITH DIFFERENTIAL/PLATELET
Basophils Absolute: 0.1 10*3/uL (ref 0.0–0.1)
Basophils Relative: 1 % (ref 0–1)
Eosinophils Absolute: 0.5 10*3/uL (ref 0.0–0.7)
Eosinophils Relative: 5 % (ref 0–5)
HCT: 41.2 % (ref 39.0–52.0)
Hemoglobin: 13.9 g/dL (ref 13.0–17.0)
Lymphocytes Relative: 38 % (ref 12–46)
Lymphs Abs: 3.7 10*3/uL (ref 0.7–4.0)
MCH: 29.6 pg (ref 26.0–34.0)
MCHC: 33.7 g/dL (ref 30.0–36.0)
MCV: 87.7 fL (ref 78.0–100.0)
Monocytes Absolute: 0.8 10*3/uL (ref 0.1–1.0)
Monocytes Relative: 8 % (ref 3–12)
Neutro Abs: 4.6 10*3/uL (ref 1.7–7.7)
Neutrophils Relative %: 48 % (ref 43–77)
Platelets: 247 10*3/uL (ref 150–400)
RBC: 4.7 MIL/uL (ref 4.22–5.81)
RDW: 19.7 % — ABNORMAL HIGH (ref 11.5–15.5)
WBC: 9.6 10*3/uL (ref 4.0–10.5)

## 2014-02-14 LAB — RAPID URINE DRUG SCREEN, HOSP PERFORMED
Amphetamines: NOT DETECTED
Barbiturates: NOT DETECTED
Benzodiazepines: NOT DETECTED
Cocaine: NOT DETECTED
Opiates: NOT DETECTED
Tetrahydrocannabinol: NOT DETECTED

## 2014-02-14 LAB — URINALYSIS, ROUTINE W REFLEX MICROSCOPIC
Bilirubin Urine: NEGATIVE
Glucose, UA: NEGATIVE mg/dL
Ketones, ur: NEGATIVE mg/dL
Leukocytes, UA: NEGATIVE
Nitrite: NEGATIVE
Protein, ur: 30 mg/dL — AB
Specific Gravity, Urine: 1.018 (ref 1.005–1.030)
Urobilinogen, UA: 0.2 mg/dL (ref 0.0–1.0)
pH: 6.5 (ref 5.0–8.0)

## 2014-02-14 LAB — TROPONIN I: Troponin I: <0.03 ng/mL (ref <0.031)

## 2014-02-14 MED ORDER — SODIUM CHLORIDE 0.9 % IV BOLUS (SEPSIS)
500.0000 mL | Freq: Once | INTRAVENOUS | Status: AC
  Administered 2014-02-14: 500 mL via INTRAVENOUS

## 2014-02-14 NOTE — Telephone Encounter (Signed)
Call from patient requesting a shower chair and nebulizer.  Sander Nephew, RN 02/14/2014 9:49 AM

## 2014-02-14 NOTE — ED Notes (Signed)
Patient transported to X-ray 

## 2014-02-14 NOTE — ED Notes (Signed)
MD at bedside. 
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
## 2014-02-14 NOTE — ED Notes (Signed)
Pt ambulated with walker. Pts HR was 130 and SpO2 was 92%

## 2014-02-14 NOTE — ED Notes (Signed)
Per EMS, pt woke up this morning with cp and increased SOB. Upon ems arrival pt in SVT with a rate of 190. Pt given 561ml NS and 6 of adenosine which brought his rate to 120 ST. Pt also reports recent diagnosis of pneumonia. Pt denies CP at this time. NAD at this time. Pt alertx 4

## 2014-02-14 NOTE — Discharge Instructions (Signed)
Your heart rate was elevated on arrival to the ED. You were most likely dehydrated. Your main drink should be WATER. Juices and sodas (especially with caffeine) can DEHYDRATE you and should be avoided/only drink small amounts.  Supraventricular Tachycardia Supraventricular tachycardia (SVT) is an abnormal heart rhythm (arrhythmia) that causes the heart to beat very fast (tachycardia). This kind of fast heartbeat originates in the upper chambers of the heart (atria). SVT can cause the heart to beat greater than 100 beats per minute. SVT can have a rapid burst of heartbeats. This can start and stop suddenly without warning and is called nonsustained. SVT can also be sustained, in which the heart beats at a continuous fast rate.  CAUSES  There can be different causes of SVT. Some of these include:  Heart valve problems such as mitral valve prolapse.  An enlarged heart (hypertrophic cardiomyopathy).  Congenital heart problems.  Heart inflammation (pericarditis).  Hyperthyroidism.  Low potassium or magnesium levels.  Caffeine.  Drug use such as cocaine, methamphetamines, or stimulants.  Some over-the-counter medicines such as:  Decongestants.  Diet medicines.  Herbal medicines. SYMPTOMS  Symptoms of SVT can vary. Symptoms depend on whether the SVT is sustained or nonsustained. You may experience:  No symptoms (asymptomatic).  An awareness of your heart beating rapidly (palpitations).  Shortness of breath.  Chest pain or pressure. If your blood pressure drops because of the SVT, you may experience:  Fainting or near fainting.  Weakness.  Dizziness. DIAGNOSIS  Different tests can be performed to diagnose SVT, such as:  An electrocardiogram (EKG). This is a painless test that records the electrical activity of your heart.  Holter monitor. This is a 24 hour recording of your heart rhythm. You will be given a diary. Write down all symptoms that you have and what you were  doing at the time you experienced symptoms.  Arrhythmia monitor. This is a small device that your wear for several weeks. It records the heart rhythm when you have symptoms.  Echocardiogram. This is an imaging test to help detect abnormal heart structure such as congenital abnormalities, heart valve problems, or heart enlargement.  Stress test. This test can help determine if the SVT is related to exercise.  Electrophysiology study (EPS). This is a procedure that evaluates your heart's electrical system and can help your caregiver find the cause of your SVT. TREATMENT  Treatment of SVT depends on the symptoms, how often it recurs, and whether there are any underlying heart problems.   If symptoms are rare and no other cardiac disease is present, no treatment may be needed.  Blood work may be done to check potassium, magnesium, and thyroid hormone levels to see if they are abnormal. If these levels are abnormal, treatment to correct the problems will occur. Medicines Your caregiver may use oral medicines to treat SVT. These medicines are given for long-term control of SVT. Medicines may be used alone or in combination with other treatments. These medicines work to slow nerve impulses in the heart muscle. These medicines can also be used to treat high blood pressure. Some of these medicines may include:  Calcium channel blockers.  Beta blockers.  Digoxin. Nonsurgical procedures Nonsurgical techniques may be used if oral medicines do not work. Some examples include:  Cardioversion. This technique uses either drugs or an electrical shock to restore a normal heart rhythm.  Cardioversion drugs may be given through an intravenous (IV) line to help "reset" the heart rhythm.  In electrical cardioversion, the caregiver  shocks your heart to stop its beat for a split second. This helps to reset the heart to a normal rhythm.  Ablation. This procedure is done under mild sedation. High frequency  radio wave energy is used to destroy the area of heart tissue responsible for the SVT. HOME CARE INSTRUCTIONS   Do not smoke.  Only take medicines prescribed by your caregiver. Check with your caregiver before using over-the-counter medicines.  Check with your caregiver about how much alcohol and caffeine (coffee, tea, colas, or chocolate) you may have.  It is very important to keep all follow-up referrals and appointments in order to properly manage this problem. SEEK IMMEDIATE MEDICAL CARE IF:  You have dizziness.  You faint or nearly faint.  You have shortness of breath.  You have chest pain or pressure.  You have sudden nausea or vomiting.  You have profuse sweating.  You are concerned about how long your symptoms last.  You are concerned about the frequency of your SVT episodes. If you have the above symptoms, call your local emergency services (911 in [place] immediately. Do not drive yourself to the hospital. MAKE SURE YOU:   Understand these instructions.  Will watch your condition.  Will get help right away if you are not doing well or get worse. Document Released: 12/29/2004 Document Revised: 03/23/2011 Document Reviewed: 04/12/2008 [facility] Patient Information 2015 [place], [place]. This information is not intended to replace advice given to you by your health care provider. Make sure you discuss any questions you have with your health care provider.  Supraventricular Tachycardia Supraventricular tachycardia (SVT) is an abnormal heart rhythm (arrhythmia) that causes the heart to beat very fast (tachycardia). This kind of fast heartbeat originates in the upper chambers of the heart (atria). SVT can cause the heart to beat greater than 100 beats per minute. SVT can have a rapid burst of heartbeats. This can start and stop suddenly without warning and is called nonsustained. SVT can also be sustained, in which the heart beats at a continuous fast rate.  CAUSES  There can  be different causes of SVT. Some of these include:  Heart valve problems such as mitral valve prolapse.  An enlarged heart (hypertrophic cardiomyopathy).  Congenital heart problems.  Heart inflammation (pericarditis).  Hyperthyroidism.  Low potassium or magnesium levels.  Caffeine.  Drug use such as cocaine, methamphetamines, or stimulants.  Some over-the-counter medicines such as:  Decongestants.  Diet medicines.  Herbal medicines. SYMPTOMS  Symptoms of SVT can vary. Symptoms depend on whether the SVT is sustained or nonsustained. You may experience:  No symptoms (asymptomatic).  An awareness of your heart beating rapidly (palpitations).  Shortness of breath.  Chest pain or pressure. If your blood pressure drops because of the SVT, you may experience:  Fainting or near fainting.  Weakness.  Dizziness. DIAGNOSIS  Different tests can be performed to diagnose SVT, such as:  An electrocardiogram (EKG). This is a painless test that records the electrical activity of your heart.  Holter monitor. This is a 24 hour recording of your heart rhythm. You will be given a diary. Write down all symptoms that you have and what you were doing at the time you experienced symptoms.  Arrhythmia monitor. This is a small device that your wear for several weeks. It records the heart rhythm when you have symptoms.  Echocardiogram. This is an imaging test to help detect abnormal heart structure such as congenital abnormalities, heart valve problems, or heart enlargement.  Stress test. This test  can help determine if the SVT is related to exercise.  Electrophysiology study (EPS). This is a procedure that evaluates your heart's electrical system and can help your caregiver find the cause of your SVT. TREATMENT  Treatment of SVT depends on the symptoms, how often it recurs, and whether there are any underlying heart problems.   If symptoms are rare and no other cardiac disease is  present, no treatment may be needed.  Blood work may be done to check potassium, magnesium, and thyroid hormone levels to see if they are abnormal. If these levels are abnormal, treatment to correct the problems will occur. Medicines Your caregiver may use oral medicines to treat SVT. These medicines are given for long-term control of SVT. Medicines may be used alone or in combination with other treatments. These medicines work to slow nerve impulses in the heart muscle. These medicines can also be used to treat high blood pressure. Some of these medicines may include:  Calcium channel blockers.  Beta blockers.  Digoxin. Nonsurgical procedures Nonsurgical techniques may be used if oral medicines do not work. Some examples include:  Cardioversion. This technique uses either drugs or an electrical shock to restore a normal heart rhythm.  Cardioversion drugs may be given through an intravenous (IV) line to help "reset" the heart rhythm.  In electrical cardioversion, the caregiver shocks your heart to stop its beat for a split second. This helps to reset the heart to a normal rhythm.  Ablation. This procedure is done under mild sedation. High frequency radio wave energy is used to destroy the area of heart tissue responsible for the SVT. HOME CARE INSTRUCTIONS   Do not smoke.  Only take medicines prescribed by your caregiver. Check with your caregiver before using over-the-counter medicines.  Check with your caregiver about how much alcohol and caffeine (coffee, tea, colas, or chocolate) you may have.  It is very important to keep all follow-up referrals and appointments in order to properly manage this problem. SEEK IMMEDIATE MEDICAL CARE IF:  You have dizziness.  You faint or nearly faint.  You have shortness of breath.  You have chest pain or pressure.  You have sudden nausea or vomiting.  You have profuse sweating.  You are concerned about how long your symptoms  last.  You are concerned about the frequency of your SVT episodes. If you have the above symptoms, call your local emergency services (911 in [place] immediately. Do not drive yourself to the hospital. MAKE SURE YOU:   Understand these instructions.  Will watch your condition.  Will get help right away if you are not doing well or get worse. Document Released: 12/29/2004 Document Revised: 03/23/2011 Document Reviewed: 04/12/2008 [facility] Patient Information 2015 [place], [place]. This information is not intended to replace advice given to you by your health care provider. Make sure you discuss any questions you have with your health care provider.

## 2014-02-15 ENCOUNTER — Other Ambulatory Visit: Payer: Self-pay | Admitting: *Deleted

## 2014-02-15 ENCOUNTER — Inpatient Hospital Stay (HOSPITAL_COMMUNITY): Admit: 2014-02-15 | Payer: Self-pay

## 2014-02-15 NOTE — Telephone Encounter (Signed)
Rx written 02/01/14

## 2014-02-16 MED ORDER — AMOXICILLIN-POT CLAVULANATE 875-125 MG PO TABS: 1.0000 | ORAL_TABLET | Freq: Two times a day (BID) | ORAL | Status: DC

## 2014-02-19 ENCOUNTER — Telehealth: Payer: Self-pay | Admitting: Licensed Clinical Social Worker

## 2014-02-19 ENCOUNTER — Ambulatory Visit (INDEPENDENT_AMBULATORY_CARE_PROVIDER_SITE_OTHER): Payer: Medicaid Other | Admitting: Internal Medicine

## 2014-02-19 ENCOUNTER — Encounter: Payer: Self-pay | Admitting: Internal Medicine

## 2014-02-19 ENCOUNTER — Telehealth: Payer: Self-pay | Admitting: *Deleted

## 2014-02-19 ENCOUNTER — Ambulatory Visit: Payer: Medicaid Other | Admitting: Internal Medicine

## 2014-02-19 ENCOUNTER — Ambulatory Visit (INDEPENDENT_AMBULATORY_CARE_PROVIDER_SITE_OTHER): Payer: Medicaid Other | Admitting: Dietician

## 2014-02-19 VITALS — BP 132/78 | HR 87 | Temp 97.6°F | Ht 73.0 in | Wt 132.0 lb

## 2014-02-19 DIAGNOSIS — I2699 Other pulmonary embolism without acute cor pulmonale: Secondary | ICD-10-CM

## 2014-02-19 DIAGNOSIS — R634 Abnormal weight loss: Secondary | ICD-10-CM

## 2014-02-19 DIAGNOSIS — G060 Intracranial abscess and granuloma: Secondary | ICD-10-CM

## 2014-02-19 DIAGNOSIS — E118 Type 2 diabetes mellitus with unspecified complications: Secondary | ICD-10-CM

## 2014-02-19 DIAGNOSIS — I471 Supraventricular tachycardia: Secondary | ICD-10-CM

## 2014-02-19 DIAGNOSIS — F411 Generalized anxiety disorder: Secondary | ICD-10-CM

## 2014-02-19 DIAGNOSIS — F419 Anxiety disorder, unspecified: Secondary | ICD-10-CM

## 2014-02-19 DIAGNOSIS — I10 Essential (primary) hypertension: Secondary | ICD-10-CM

## 2014-02-19 MED ORDER — ONDANSETRON HCL 4 MG PO TABS: 4.0000 mg | ORAL_TABLET | Freq: Three times a day (TID) | ORAL | Status: DC | PRN

## 2014-02-19 MED ORDER — CLONAZEPAM 1 MG PO TABS
ORAL_TABLET | ORAL | Status: DC
Start: 2014-02-19 — End: 2014-03-19

## 2014-02-19 NOTE — Patient Instructions (Signed)
General Instructions:   Please bring your medicines with you each time you come to clinic.  Medicines may include prescription medications, over-the-counter medications, herbal remedies, eye drops, vitamins, or other pills.   FOR YOUR NAUSEA: -Try zofran (ondansetron) 4 mg 30-45 minutes prior to antibiotics/meals to help with nausea -Continue to take Protonix and Zantac  FOR YOUR ANXIETY: -I have re-prescribed Klonopin 1 mg  Three times a day as needed for anxiety -We will refer you to [facility] for [facility]ry help  FOR YOUR BRAIN ABSCESS: -Continue to take Augmentin 1 pill twice a day -We will recheck a scan of your brain and let you know the results

## 2014-02-19 NOTE — Telephone Encounter (Signed)
CSW received call from [place], [place] requesting to initiate SCAT application for [name].  Pt is linked with Medicaid transportation but states difficulty accessing.  CSW met with [name] prior to his scheduled [facility] appointment. Pt signed Part B and is currently working on Part A.

## 2014-02-19 NOTE — Patient Instructions (Signed)
Cold foods will make you less nauseated: Use high calorie foods as much as possible: Drink whole milk as much as possible Egg salad,  Pimento cheese Cheese can be added to eggs, soup, vegetables,  Can also add butter to vegetables Gravy also helps make foods easier to swallow Juice is okay as long you drink the same amount of water Cream soups are good- cream of broccoli I will ask your doctor about a bowel program - a stool softener and fiber supplement each day.  Try to get a snack between meals at least 2 -3 times a day. Ice cream is great for snack. Can use ensure of boost as a snack.

## 2014-02-19 NOTE — Assessment & Plan Note (Signed)
-  Compliant with Xarelto 20 mg daily

## 2014-02-19 NOTE — Telephone Encounter (Signed)
Pharmacy and pt informed.
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
## 2014-02-19 NOTE — Assessment & Plan Note (Signed)
BP Readings from Last 3 Encounters:  02/19/14 132/78  02/14/14 102/68  02/07/14 140/69    Lab Results  Component Value Date   NA 139 02/14/2014   K 4.6 02/14/2014   CREATININE 0.77 02/14/2014    Assessment: Blood pressure control:  Well controlled Progress toward BP goal:   At goal Comments: Controlled off of medications  Plan: Continue to monitor
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
## 2014-02-19 NOTE — Telephone Encounter (Signed)
We will refill Klonopin 1 mg TID #90 this once with strict instructions if this happens again, we will not refill. Also will obtain UDS on follow up visit.

## 2014-02-19 NOTE — Assessment & Plan Note (Signed)
Lab Results  Component Value Date   HGBA1C 7.0* 01/02/2014   HGBA1C 5.5 09/13/2013   HGBA1C 5.5 06/12/2013     Assessment: Diabetes control:  Controlled Progress toward A1C goal:   Detiorated Comments: Compliant with metformin 500 mg BID.   Plan: Medications:  continue current medications Instruction/counseling given: discussed diet Educational resources provided: handout Other plans: Patient met with Butch Penny, please see separate note

## 2014-02-19 NOTE — Assessment & Plan Note (Signed)
Patient was recently seen in the ED for chest pain and rapid heart rate. EMS found patient in SVT with HR in the 180s. Patient was given NS 500 cc IV bolus and adenosine with conversion to sinus tachycardia, HR 120s. Patient responded to another 500 cc bolus. Troponin negative x 1. EKG showed sinus tach. Patient was discharged from ED with HR 90-100s. No recurrent chest pain, shortness of breath or rapid heart rate.   Plan: -Continue to monitor
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
## 2014-02-19 NOTE — Assessment & Plan Note (Addendum)
Patient has been compliant with Augmentin BID. This is likely what has been causing his nausea. Patient needs repeat CT brain and follow up with ID. Patient states he went to dentist, but dentist did not recommend removing any teeth. Will need to follow up with dentist [phone].  Plan: -Continue Augmentin BID -CT Head wo contrast -Follow up with ID on 03/22/14
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
## 2014-02-20 ENCOUNTER — Other Ambulatory Visit: Payer: Self-pay | Admitting: *Deleted

## 2014-02-20 MED ORDER — RIVAROXABAN 20 MG PO TABS: 20.0000 mg | ORAL_TABLET | Freq: Every day | ORAL | Status: DC

## 2014-02-20 NOTE — Progress Notes (Signed)
INTERNAL MEDICINE TEACHING ATTENDING ADDENDUM - Calan Doren, MD: I reviewed and discussed at the time of visit with the resident Dr. Richardson, the patient's medical history, physical examination, diagnosis and results of pertinent tests and treatment and I agree with the patient's care as documented.  

## 2014-02-20 NOTE — Telephone Encounter (Signed)
SCAT application Part A & B completed, signed and faxed.

## 2014-02-21 ENCOUNTER — Other Ambulatory Visit: Payer: Self-pay | Admitting: *Deleted

## 2014-02-21 MED ORDER — AMOXICILLIN-POT CLAVULANATE 875-125 MG PO TABS: 1.0000 | ORAL_TABLET | Freq: Two times a day (BID) | ORAL | Status: DC

## 2014-02-22 ENCOUNTER — Other Ambulatory Visit: Payer: Self-pay | Admitting: *Deleted

## 2014-02-22 ENCOUNTER — Telehealth: Payer: Self-pay | Admitting: *Deleted

## 2014-02-22 NOTE — Telephone Encounter (Signed)
Patient called requesting refill on augmentin that Dr. Burney Gauze prescribed. Checked his profile and notified him that an Rx for this has already been sent to [facility]. It is a month supply which will be enough until his follow up with Dr. Baxter Flattery on 03/22/14. Myrtis Hopping

## 2014-03-03 ENCOUNTER — Telehealth: Payer: Self-pay | Admitting: Infectious Diseases

## 2014-03-03 NOTE — Telephone Encounter (Signed)
Pt called stating that he was being followed by Dr Baxter Flattery for "lesions in his head". He stated he was having worsening pain in his head.  I let him know that I could not evaluate this over the phone and he needed to be seen in the ED. He stated that he called the ED and that they said they couldn't help him.  I let him know that he should not call the ED but should GO to the ED as most likely needed a repeat CT or MRI of his head. Pt stated he would go to the ED

## 2014-03-05 ENCOUNTER — Telehealth: Payer: Self-pay | Admitting: *Deleted

## 2014-03-05 NOTE — Telephone Encounter (Signed)
Patient called and is very worried he has 6 months to 1 year to live because the Internet told him so. He wants to know why he has to wait until 03/22/14 to see the doctor and why Dr Marvel Plan did not give him any new medicine for his brain lesions. After about 5 minutes was able to calm the patient down and advised him he is fine to wait until 03/22/14 to see Dr Baxter Flattery and that she will answer his questions. He wants to know how long he has to live with the lesions and what kind of lesions he has and can he be saved. Advised him he should be fine reminded him he just saw Dr Marvel Plan and she would have told him if he was bad enough to be given 6 months to live. Encouraged him to stay off of the Internet as they always have the worse case as the outcome and if he has any problems before 03/22/14 to call the office back. He advise ok but he was going to call his PCP office for answers in the mean time. Tried to discourage this and tell him to relax but he is very focused on his lesions.

## 2014-03-08 ENCOUNTER — Telehealth: Payer: Self-pay | Admitting: Licensed Clinical Social Worker

## 2014-03-08 NOTE — Telephone Encounter (Signed)
CSW placed call to Mr. Crowson as he was referred to psychiatry during his last [facility] appointment.  Mr. Parfait is known to this worker and pt was previously referred to [employer].  CSW placed call to Mr. Stecklein to confirm.  Pt states he was recently seen by [employer] and they are coming out to his home.   CSW inquired if [facility] Lazar would be managing his Klonopin; pt states "They were until I was in the hospital.  They needed another referral.  Dr. (?) wanted to stop it but I can't I have bad effects if I try to get off it."  Contact information for Daisy Lazar is under pt's Psychiatric Referral. Pt inquired about prescription for Ensure.  CSW informed Mr. Clauson, insurance does not cover Ensure, however, with a prescription pt can go to [employer] and purchase a case for $2.  Pt states he has an appointment next week, CSW will be in office and provide assistance as to how to obtain the Ensure.  Pt denies add'l social work needs at this time.

## 2014-03-13 ENCOUNTER — Ambulatory Visit (INDEPENDENT_AMBULATORY_CARE_PROVIDER_SITE_OTHER): Payer: Medicaid Other | Admitting: Internal Medicine

## 2014-03-13 ENCOUNTER — Encounter: Payer: Self-pay | Admitting: Internal Medicine

## 2014-03-13 VITALS — BP 132/78 | HR 109 | Temp 97.6°F | Ht 73.0 in | Wt 142.3 lb

## 2014-03-13 DIAGNOSIS — I2699 Other pulmonary embolism without acute cor pulmonale: Secondary | ICD-10-CM

## 2014-03-13 DIAGNOSIS — Z7901 Long term (current) use of anticoagulants: Secondary | ICD-10-CM

## 2014-03-13 DIAGNOSIS — G060 Intracranial abscess and granuloma: Secondary | ICD-10-CM

## 2014-03-13 DIAGNOSIS — B954 Other streptococcus as the cause of diseases classified elsewhere: Secondary | ICD-10-CM

## 2014-03-13 DIAGNOSIS — B351 Tinea unguium: Secondary | ICD-10-CM

## 2014-03-13 DIAGNOSIS — E118 Type 2 diabetes mellitus with unspecified complications: Secondary | ICD-10-CM

## 2014-03-13 DIAGNOSIS — E119 Type 2 diabetes mellitus without complications: Secondary | ICD-10-CM

## 2014-03-13 DIAGNOSIS — M79671 Pain in right foot: Secondary | ICD-10-CM

## 2014-03-13 DIAGNOSIS — I10 Essential (primary) hypertension: Secondary | ICD-10-CM

## 2014-03-13 LAB — GLUCOSE, CAPILLARY: Glucose-Capillary: 123 mg/dL — ABNORMAL HIGH (ref 70–99)

## 2014-03-13 LAB — POCT GLYCOSYLATED HEMOGLOBIN (HGB A1C): HEMOGLOBIN A1C: 4.9

## 2014-03-13 LAB — HM DIABETES EYE EXAM

## 2014-03-13 MED ORDER — TERBINAFINE HCL 250 MG PO TABS: 250.0000 mg | ORAL_TABLET | Freq: Every day | ORAL | Status: DC

## 2014-03-13 NOTE — Assessment & Plan Note (Signed)
Lab Results  Component Value Date   HGBA1C 4.9 03/13/2014   HGBA1C 7.0* 01/02/2014   HGBA1C 5.5 09/13/2013     Assessment: Diabetes control: good control (HgbA1C at goal) Progress toward A1C goal:  at goal Comments: On metformin 500 mg bid. This is too low and may be related to recent weight loss in past year. See foot issue as separate problem.  Plan: Medications:  Will stop metformin 500 mg bid. No medications at this time Home glucose monitoring: Frequency:   Timing:   Instruction/counseling given: reminded to get eye exam, reminded to bring medications to each visit and discussed foot care Educational resources provided: brochure (denies) Self management tools provided:   Other plans: Retinal imaging today

## 2014-03-13 NOTE — Assessment & Plan Note (Signed)
[name] notes he is still compliant with xarelto 20 mg daily with no issues. He is to complete 6 months of anti-coagulation -cont xarelto 20 mg daily

## 2014-03-13 NOTE — Assessment & Plan Note (Signed)
[name] reports R foot pain of unknown chronicity. It is worse on the medial aspect of his foot which he says is tender to palpation. He has known DM2 and PVD but other than onychomycosis, no other signs of infection such as cellulitis. He also has several falls so fracture is possible. -R foot X-ray

## 2014-03-13 NOTE — Progress Notes (Signed)
   Subjective:    Patient ID: [name], male    DOB: [birth year], 54 y.o.   MRN: [id number]  HPI  [name] is a 54 year old man with complicated PMH including brain abscesses, PE on xarelto, HTN, PVD, DM2, substance abuse who presents for follow-up.   He has been on augmentin bid since 02/01/14 (changed from amoxicillin, cipro, and flagyl due to diarrhea) with plan to follow-up with Dr Baxter Flattery of ID on 03/22/14. He needs repeat CT brain to document progress of brain abscess treatment while on antibiotics before decision can be made to discontinue or change the antibiotic regimen. He has had trouble with insurance authorization for brain CT.  He is eating well after his feeding tube was removed on 02/07/14.  No issues with xarelto compliance.  His home health RN noted that his R great toe appeared infected and asked him to have [place] look at it. He has never been seen by a podiatrist. He also reports that his feet sometimes feel that they are disconnected from his body. He denied any numbness. He also notes pain in the medial aspect of his R foot. He has had several falls in the past six months. No other complaints today.  Review of Systems  Constitutional: Negative for fever, chills and diaphoresis.  Respiratory: Negative for cough and shortness of breath.   Cardiovascular: Negative for chest pain and palpitations.  Gastrointestinal: Negative for nausea, vomiting, abdominal pain, diarrhea and constipation.  Musculoskeletal:       R foot pain  Neurological: Positive for weakness. Negative for dizziness, light-headedness and numbness.       Objective:   Physical Exam  Constitutional: He is oriented to person, place, and time. He appears well-developed and well-nourished. No distress.  HENT:  Head: Normocephalic and atraumatic.  Mouth/Throat: Oropharynx is clear and moist.  Eyes: EOM are normal. Pupils are equal, round, and reactive to light.  Cardiovascular: Normal rate, regular rhythm,  normal heart sounds and intact distal pulses.  Exam reveals no gallop and no friction rub.   No murmur heard. Pulmonary/Chest: Effort normal and breath sounds normal. No respiratory distress. He has no wheezes.  Abdominal: Soft. Bowel sounds are normal. He exhibits no distension. There is no tenderness.  Musculoskeletal: He exhibits no edema.  R foot with tenderness on palpation of the medial aspect. R great toe with nail thickening and yellow pigmentation. Beginning of small fissure on medial aspect of R great toe. Foot warm with strong pulses  Neurological: He is alert and oriented to person, place, and time. No cranial nerve deficit. He exhibits normal muscle tone. Coordination normal.  Skin: He is not diaphoretic.  Vitals reviewed.         Assessment & Plan:

## 2014-03-13 NOTE — Assessment & Plan Note (Signed)
BP Readings from Last 3 Encounters:  03/13/14 132/78  02/19/14 132/78  02/14/14 102/68    Lab Results  Component Value Date   NA 139 02/14/2014   K 4.6 02/14/2014   CREATININE 0.77 02/14/2014    Assessment: Blood pressure control: controlled Progress toward BP goal:  at goal Comments: well controlled on no medications  Plan: Medications:  none Educational resources provided: brochure (denies) Self management tools provided:   Other plans: continue to monitor

## 2014-03-13 NOTE — Patient Instructions (Signed)
It was a pleasure to see you today. Please stop taking your metformin for diabetes. Please take terbinafine 250 mg once a day for 12 weeks for your toenail infection. Please get your R foot X-ray and do your podiatry referral. Please keep your infection doctor appointment (Dr Baxter Flattery) on 3/10!  Please return to clinic or seek medical attention if you have any new or worsening trouble breathing, confusion, or other worrisome medical condition. We look forward to seeing you again soon.  Lottie Mussel, MD  General Instructions:   Please try to bring all your medicines next time. This will help [place] keep you safe from mistakes.   Progress Toward Treatment Goals:  Treatment Goal 03/13/2014  Hemoglobin A1C at goal  Blood pressure at goal  Stop smoking smoking the same amount  Prevent falls -    Self Care Goals & Plans:  Self Care Goal 03/13/2014  Manage my medications take my medicines as prescribed; bring my medications to every visit; refill my medications on time  Monitor my health -  Eat healthy foods drink diet soda or water instead of juice or soda; eat more vegetables; eat foods that are low in salt; eat baked foods instead of fried foods; eat fruit for snacks and desserts  Be physically active -  Stop smoking -  Meeting treatment goals maintain the current self-care plan    Home Blood Glucose Monitoring 09/26/2012  Check my blood sugar no home glucose monitoring  When to check my blood sugar N/A     Care Management & Community Referrals:  Referral 09/26/2012  Referrals made for care management support none needed  Referrals made to community resources none

## 2014-03-13 NOTE — Assessment & Plan Note (Addendum)
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 He was previously on amoxicillin, ciprofloxacin, and flagyl until 02/01/14 when he was transitioned to augmentin due to diarrhea. He is to follow-up with Dr Baxter Flattery of ID on 03/22/14 and his compliance with this appointment was reiterated several times. He requires repeat CT brain per ID request to document whether the brain abscess is responsive to this treatment. Unfortunately his insurance has not yet authorized this imaging which is necessary to decide whether his antibiotic regimen should be changed or discontinued. Last head CT 01/03/14 which showed continued improvement in cerebral abscesses. [facility] staff working with [employer] on authorization. No focal neurologic deficits on exam. -cont to attempt insurance authorization of head CT -cont augmentin 875-125 mg bid pending ID visit 3/10

## 2014-03-13 NOTE — Assessment & Plan Note (Signed)
[name] appears to have onychomycosis of the R great toe. He has DM2 which puts him at risk for foot infections but it is well controlled. He has never seen a podiatrist but is agreeable. It does not appear that terbinafine is contraindicated in xarelto or augmentin use so will give 12 week course of terbinafine. The fissure is not infected so patient educated on foot hygiene and will monitor -terbinafine 250 mg po x 12 weeks -podiatry referral -cont to monitor foot and foot hygiene

## 2014-03-15 ENCOUNTER — Encounter: Payer: Self-pay | Admitting: *Deleted

## 2014-03-15 NOTE — Progress Notes (Signed)
Internal Medicine Clinic Attending  I saw and evaluated the patient.  I personally confirmed the key portions of the history and exam documented by Dr. Rothman and I reviewed pertinent patient test results.  The assessment, diagnosis, and plan were formulated together and I agree with the documentation in the resident's note. 

## 2014-03-17 IMAGING — CT CT ABD-PELV W/O CM
2 of 4 series · 17 of 46 positions shown, 19 images · non-contrast
Comparison: 03/01/2010

CLINICAL DATA: Left flank pain.

CT ABDOMEN AND PELVIS WITHOUT CONTRAST
TECHNIQUE: Multidetector CT imaging of the abdomen and pelvis was
performed following the standard protocol without intravenous
contrast.

[Series 2: stone >200 lbs 5.0 b31f st · axial · 0.70mm/px · z∈[+790,+1230]mm · 14 of 96 slices shown, 16 images]
[im 4/96  soft-tissue]
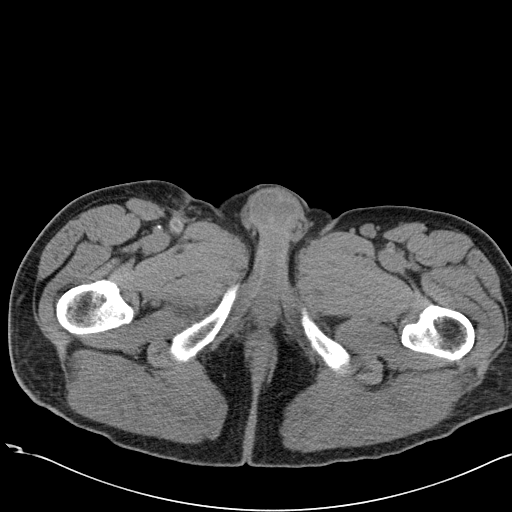
[im 4/96  bone]
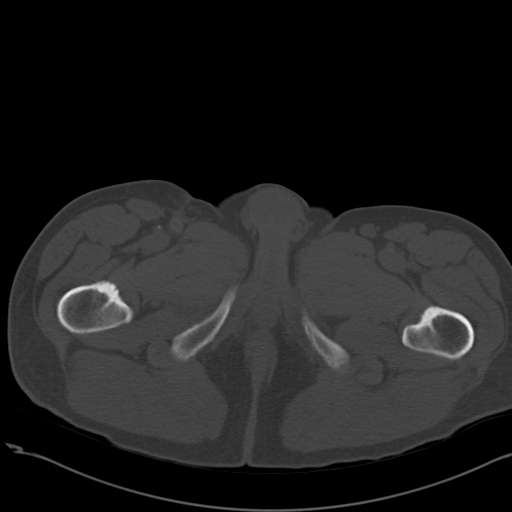
[im 12/96  soft-tissue]
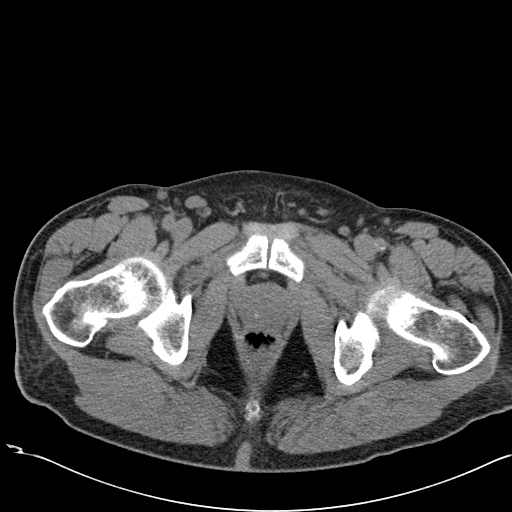
[im 20/96  soft-tissue]
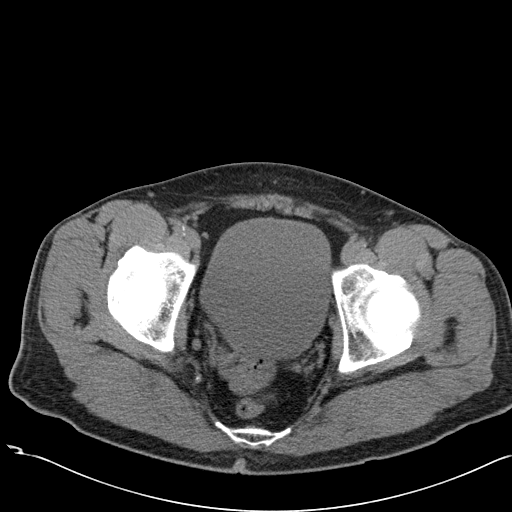
[im 27/96  soft-tissue]
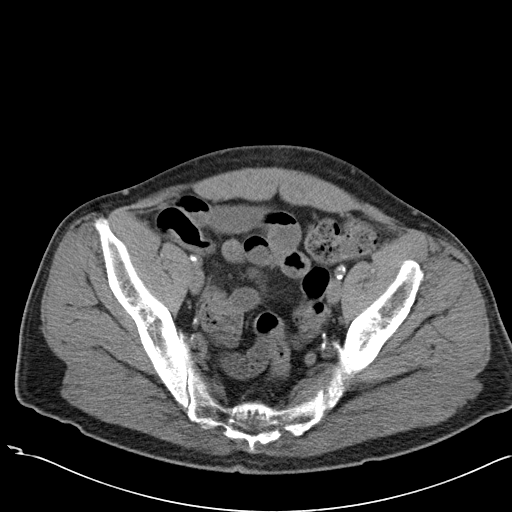
[im 31/96  soft-tissue]
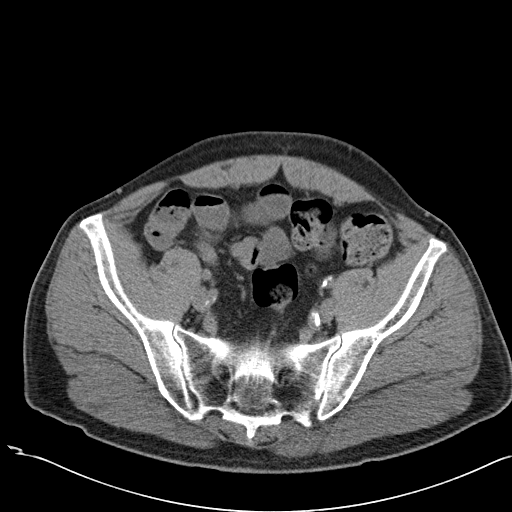
[im 39/96  soft-tissue]
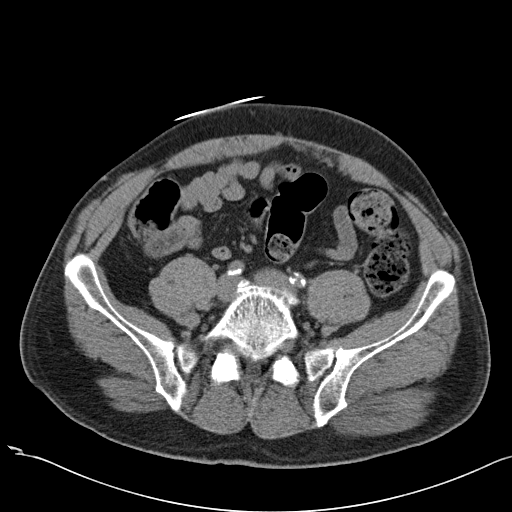
[im 46/96  soft-tissue]
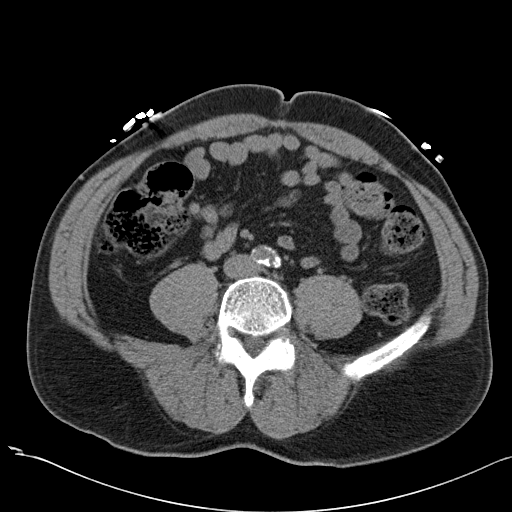
[im 50/96  soft-tissue]
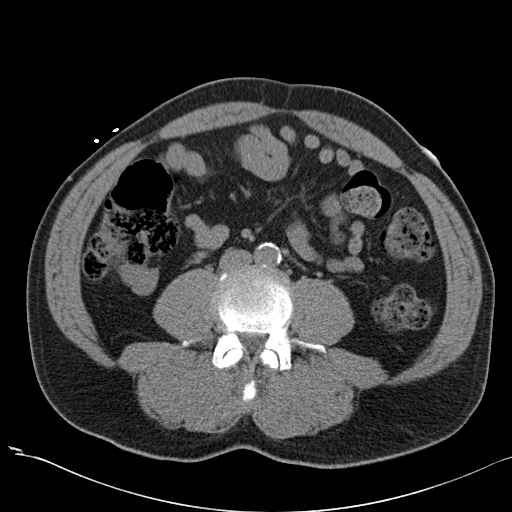
[im 58/96  soft-tissue]
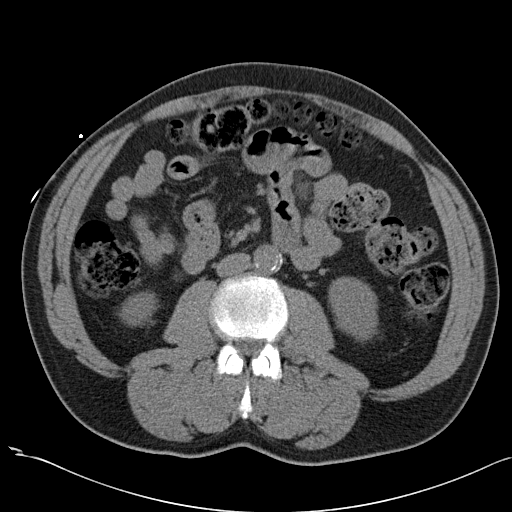
[im 58/96  bone]
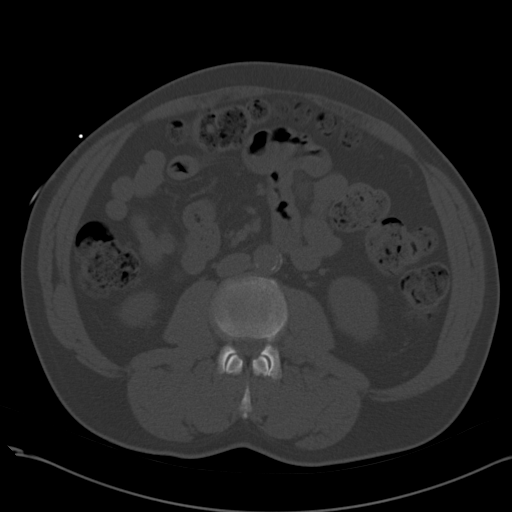
[im 65/96  soft-tissue]
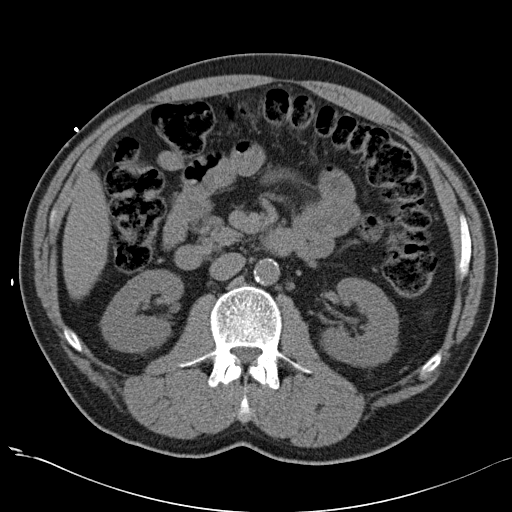
[im 73/96  soft-tissue]
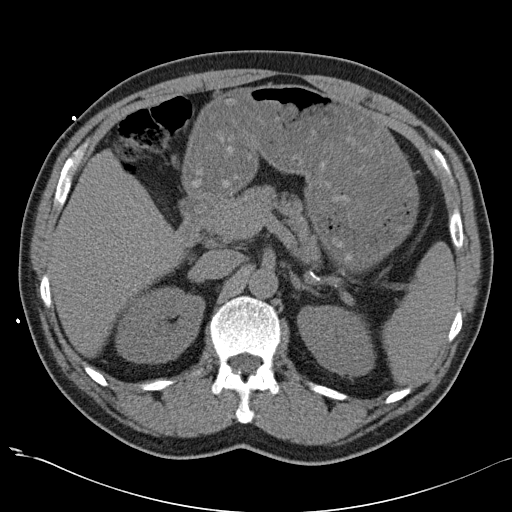
[im 77/96  soft-tissue]
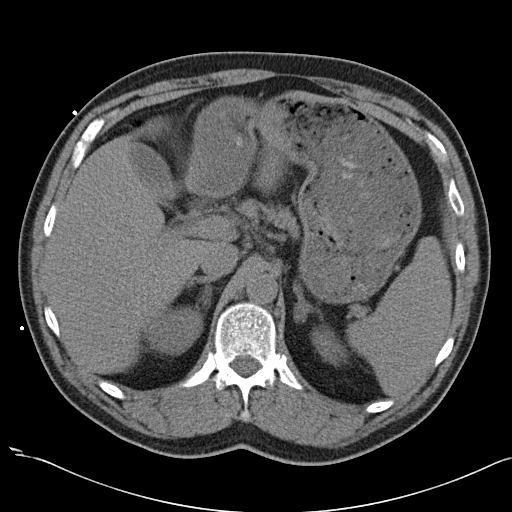
[im 84/96  soft-tissue]
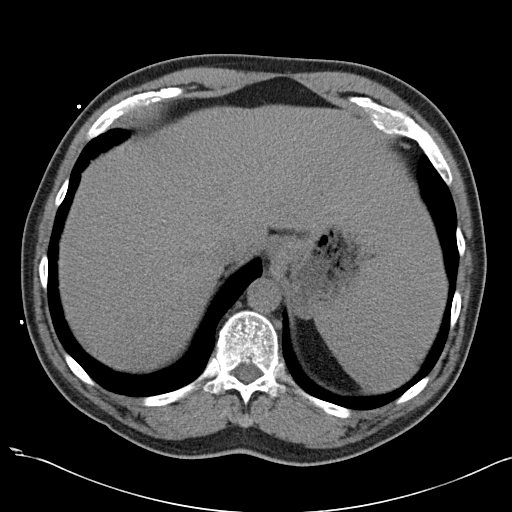
[im 92/96  soft-tissue]
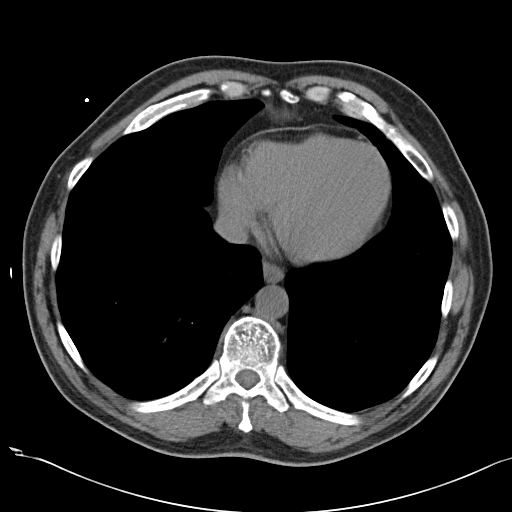

[Series 602: cor · coronal · 0.93mm/px · 3 of 136 slices shown]
[im 46/136  soft-tissue]
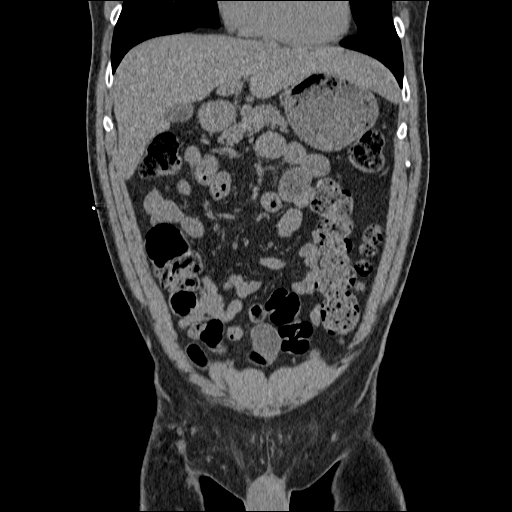
[im 61/136  soft-tissue]
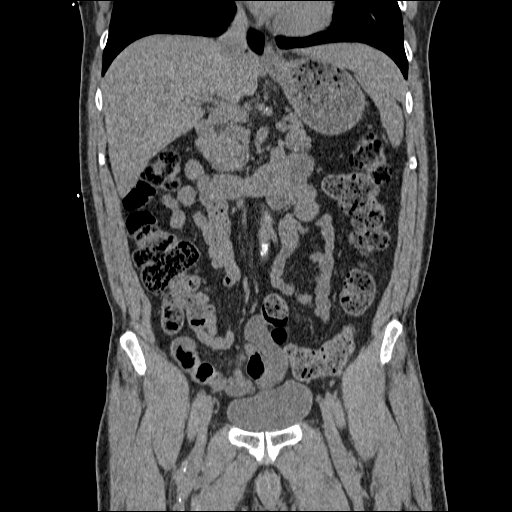
[im 76/136  soft-tissue]
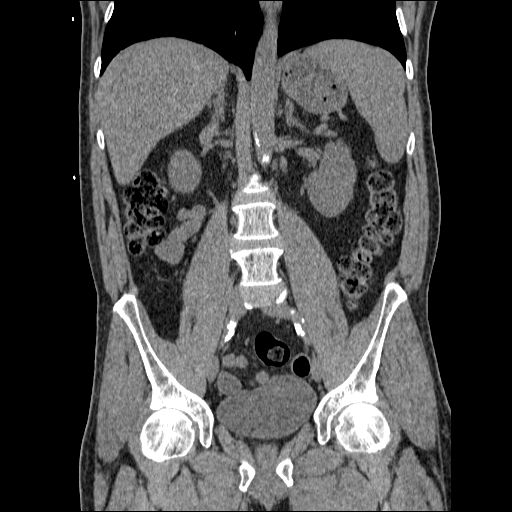

[17 of 46 positions shown; findings below may reference images not displayed]

FINDINGS: Lung bases are clear.  No effusions.  Heart is normal
size.

Liver, gallbladder, spleen, stomach, pancreas, adrenals are
unremarkable.  Extensive renal vascular calcifications in the renal
hila bilaterally.  No renal or ureteral stones.  No hydronephrosis.
Urinary bladder is unremarkable.

Moderate stool throughout the colon.  No evidence of obstruction.
Small bowel is decompressed.  No free fluid or free air. Appendix
is visualized and is normal.

Aorta and iliac vessels are heavily calcified, non-aneurysmal.

No acute bony abnormality.
IMPRESSION: No acute findings in the abdomen or pelvis.

## 2014-03-18 ENCOUNTER — Encounter (HOSPITAL_COMMUNITY): Payer: Self-pay | Admitting: Emergency Medicine

## 2014-03-18 ENCOUNTER — Emergency Department (HOSPITAL_COMMUNITY)
Admission: EM | Admit: 2014-03-18 | Discharge: 2014-03-18 | Discharge status: Against Medical Advice | Payer: Medicaid Other | Attending: Emergency Medicine | Admitting: Emergency Medicine

## 2014-03-18 ENCOUNTER — Emergency Department (HOSPITAL_COMMUNITY): Payer: Medicaid Other

## 2014-03-18 DIAGNOSIS — E785 Hyperlipidemia, unspecified: Secondary | ICD-10-CM | POA: Diagnosis not present

## 2014-03-18 DIAGNOSIS — Z720 Tobacco use: Secondary | ICD-10-CM | POA: Insufficient documentation

## 2014-03-18 DIAGNOSIS — G894 Chronic pain syndrome: Secondary | ICD-10-CM | POA: Diagnosis not present

## 2014-03-18 DIAGNOSIS — Z7901 Long term (current) use of anticoagulants: Secondary | ICD-10-CM | POA: Insufficient documentation

## 2014-03-18 DIAGNOSIS — Z8619 Personal history of other infectious and parasitic diseases: Secondary | ICD-10-CM | POA: Insufficient documentation

## 2014-03-18 DIAGNOSIS — Z8673 Personal history of transient ischemic attack (TIA), and cerebral infarction without residual deficits: Secondary | ICD-10-CM | POA: Insufficient documentation

## 2014-03-18 DIAGNOSIS — K219 Gastro-esophageal reflux disease without esophagitis: Secondary | ICD-10-CM | POA: Diagnosis not present

## 2014-03-18 DIAGNOSIS — J449 Chronic obstructive pulmonary disease, unspecified: Secondary | ICD-10-CM | POA: Diagnosis not present

## 2014-03-18 DIAGNOSIS — Z87438 Personal history of other diseases of male genital organs: Secondary | ICD-10-CM | POA: Insufficient documentation

## 2014-03-18 DIAGNOSIS — R05 Cough: Secondary | ICD-10-CM | POA: Insufficient documentation

## 2014-03-18 DIAGNOSIS — Z79899 Other long term (current) drug therapy: Secondary | ICD-10-CM | POA: Diagnosis not present

## 2014-03-18 DIAGNOSIS — E119 Type 2 diabetes mellitus without complications: Secondary | ICD-10-CM | POA: Diagnosis not present

## 2014-03-18 DIAGNOSIS — Z95810 Presence of automatic (implantable) cardiac defibrillator: Secondary | ICD-10-CM | POA: Diagnosis not present

## 2014-03-18 DIAGNOSIS — R059 Cough, unspecified: Secondary | ICD-10-CM

## 2014-03-18 DIAGNOSIS — Z7951 Long term (current) use of inhaled steroids: Secondary | ICD-10-CM | POA: Insufficient documentation

## 2014-03-18 DIAGNOSIS — R55 Syncope and collapse: Secondary | ICD-10-CM | POA: Insufficient documentation

## 2014-03-18 DIAGNOSIS — R0989 Other specified symptoms and signs involving the circulatory and respiratory systems: Secondary | ICD-10-CM | POA: Diagnosis not present

## 2014-03-18 DIAGNOSIS — Z87828 Personal history of other (healed) physical injury and trauma: Secondary | ICD-10-CM | POA: Insufficient documentation

## 2014-03-18 DIAGNOSIS — I10 Essential (primary) hypertension: Secondary | ICD-10-CM | POA: Diagnosis not present

## 2014-03-18 NOTE — ED Notes (Signed)
Per EMS: pt from home found slumped over in bathroom and was aponic; pt given 4mg  Narcan with effect; pt alert at present; pt sts hx of same but denies any opoid use

## 2014-03-18 NOTE — Discharge Instructions (Signed)
Syncope Syncope means a person passes out (faints). The person usually wakes up in less than 5 minutes. It is important to seek medical care for syncope. HOME CARE  Have someone stay with you until you feel normal.  Do not drive, use machines, or play sports until your doctor says it is okay.  Keep all doctor visits as told.  Lie down when you feel like you might pass out. Take deep breaths. Wait until you feel normal before standing up.  Drink enough fluids to keep your pee (urine) clear or pale yellow.  If you take blood pressure or heart medicine, get up slowly. Take several minutes to sit and then stand. GET HELP RIGHT AWAY IF:   You have a severe headache.  You have pain in the chest, belly (abdomen), or back.  You are bleeding from the mouth or butt (rectum).  You have black or tarry poop (stool).  You have an irregular or very fast heartbeat.  You have pain with breathing.  You keep passing out, or you have shaking (seizures) when you pass out.  You pass out when sitting or lying down.  You feel confused.  You have trouble walking.  You have severe weakness.  You have vision problems. If you fainted, call your local emergency services (911 in [place] Do not drive yourself to the hospital. MAKE SURE YOU:   Understand these instructions.  Will watch your condition.  Will get help right away if you are not doing well or get worse. Document Released: 06/17/2007 Document Revised: 06/30/2011 Document Reviewed: 02/27/2011 [facility] Patient Information 2015 [place], [place]. This information is not intended to replace advice given to you by your health care provider. Make sure you discuss any questions you have with your health care provider.  Driving and Equipment Restrictions Some medical problems make it dangerous to drive, ride a bike, or use machines. Some of these problems are:  A hard blow to the head (concussion).  Passing out (fainting).  Twitching and  shaking (seizures).  Low blood sugar.  Taking medicine to help you relax (sedatives).  Taking pain medicines.  Wearing an eye patch.  Wearing splints. This can make it hard to use parts of your body that you need to drive safely. HOME CARE   Do not drive until your doctor says it is okay.  Do not use machines until your doctor says it is okay. You may need a form signed by your doctor (medical release) before you can drive again. You may also need this form before you do other tasks where you need to be fully alert. MAKE SURE YOU:  Understand these instructions.  Will watch your condition.  Will get help right away if you are not doing well or get worse. Document Released: 02/06/2004 Document Revised: 03/23/2011 Document Reviewed: 05/08/2009 [facility] Patient Information 2015 [place], [place]. This information is not intended to replace advice given to you by your health care provider. Make sure you discuss any questions you have with your health care provider.

## 2014-03-18 NOTE — ED Notes (Signed)
Pt removed IV, Kyle NT placed a bandage on the insertion site, NT reports catheter being intact upon entering the room
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
## 2014-03-19 ENCOUNTER — Other Ambulatory Visit: Payer: Self-pay | Admitting: Internal Medicine

## 2014-03-19 DIAGNOSIS — F411 Generalized anxiety disorder: Secondary | ICD-10-CM

## 2014-03-19 DIAGNOSIS — Z79899 Other long term (current) drug therapy: Secondary | ICD-10-CM

## 2014-03-19 MED ORDER — CLONAZEPAM 1 MG PO TABS: 1.0000 mg | ORAL_TABLET | Freq: Two times a day (BID) | ORAL | Status: DC | PRN
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
## 2014-03-19 NOTE — Telephone Encounter (Signed)
Called to pharm, stressed this is a taper, must last 30 days, pharmacist states he will inform pt and write on prescription, taper and 30 day supply

## 2014-03-21 ENCOUNTER — Other Ambulatory Visit (HOSPITAL_COMMUNITY): Payer: Self-pay

## 2014-03-21 ENCOUNTER — Telehealth: Payer: Self-pay

## 2014-03-21 ENCOUNTER — Ambulatory Visit: Payer: Self-pay | Admitting: Vascular Surgery

## 2014-03-21 ENCOUNTER — Inpatient Hospital Stay (HOSPITAL_COMMUNITY): Admission: RE | Admit: 2014-03-21 | Payer: Self-pay | Source: Ambulatory Visit

## 2014-03-21 DIAGNOSIS — M79604 Pain in right leg: Secondary | ICD-10-CM

## 2014-03-21 DIAGNOSIS — Z48812 Encounter for surgical aftercare following surgery on the circulatory system: Secondary | ICD-10-CM

## 2014-03-21 DIAGNOSIS — R200 Anesthesia of skin: Secondary | ICD-10-CM

## 2014-03-21 NOTE — Telephone Encounter (Signed)
Phone call from pt.  C/o sudden onset of numbness (R) foot, and pain in the sole of (R) foot and ankle.  Admits to "pain @ rest sometimes", but stated it is worse with walking.  Reported his right foot is very pale.  Stated he was [facility], at time nurse called pt.  Discussed with Dr. Scot Dock.  Ordered right LE Art. Duplex, and ABI's And to coordinate this through the Vascular Lab at [facility].  Upon returning call to pt., to inform of Dr. Nicole Cella recommendation, stated "I'm already on my way to your office."  Appt scheduled for right LE Art Duplex and ABI's at office.  Pt. will be worked in to schedule.  Verb. understanding.

## 2014-03-22 ENCOUNTER — Other Ambulatory Visit: Payer: Self-pay | Admitting: Neurological Surgery

## 2014-03-22 ENCOUNTER — Encounter: Payer: Self-pay | Admitting: Vascular Surgery

## 2014-03-22 ENCOUNTER — Encounter: Payer: Self-pay | Admitting: Internal Medicine

## 2014-03-22 ENCOUNTER — Ambulatory Visit (INDEPENDENT_AMBULATORY_CARE_PROVIDER_SITE_OTHER): Payer: Medicaid Other | Admitting: Internal Medicine

## 2014-03-22 VITALS — BP 147/75 | HR 93 | Temp 98.5°F | Wt 141.0 lb

## 2014-03-22 DIAGNOSIS — G060 Intracranial abscess and granuloma: Secondary | ICD-10-CM

## 2014-03-22 NOTE — Progress Notes (Signed)
Received call from pt's Nurse Case manager, Tiffany. re: status of pt. being evaluated for right leg pain.  Advised that due to pt. having "[place] [employer]", our office was requied to call to [place] scheduled vascular studies of ABI's and Right LE Art. Duplex on 03/21/14.  Our office was advised that the pre-authorization process may take 24 hrs, and we would be notified of the decision on 3/10.  Pt. was at our office on 3/9, and was explained the process of getting the vascular studies pre-authorized.  He was informed he would be notified for an appt., when [place] [employer] completed the process.  Advised if his pain and numbness were bad enough that he couldn't wait until 3/10, he should go to the ER at [facility]; pt. stated he was going to [facility] at that time.  Rec'd approval from [place] on 3/10 @ 10:20 AM, that it was approved to do only ABI's, and to see the MD; the right lower extremity arterial duplex was declined at this time.  Pt. Was contacted re: an appt. On 03/23/14 @ 9:30 AM for eval. Of right leg pain.

## 2014-03-22 NOTE — Progress Notes (Signed)
   Subjective:    Patient ID: [name], male    DOB: [birth year], 54 y.o.   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 During hospitalization he developed neuro deficits due to enlarging brain abscess requiring emergent drainage. OR cultures grew out micro aerophilic strep for which he was treated with ceftriaxone and metronidazole. Due to dysphagia, he had peg tub placed. He was sent to nursing home in order to finish out 8 wks of IV therapy. He was only able to do 6 wk of IV therapy and had his picc line removed on 1/06 and switched over to oral antibiotics since then. He has been on amox/clav and has been tolerating medication without difficulty.  His left sided neuro deficits of decrease grip strength, weakness in lower extremities is improved. Upcoming nchct on next week.    Review of Systems     Objective:   Physical Exam BP 147/75 mmHg  Pulse 93  Temp(Src) 98.5 F (36.9 C) (Oral)  Wt 141 lb (63.957 kg) Physical Exam  Constitutional: He is oriented to person, place, and time. Appears dishelved, underweight. No distress.  HENT: poor dentition Mouth/Throat: Oropharynx is clear and moist. No oropharyngeal exudate.  Cardiovascular: Normal rate, regular rhythm and normal heart sounds. Exam reveals no gallop and no friction rub.  No murmur heard.  Pulmonary/Chest: Effort normal and breath sounds normal. No respiratory distress. He has no wheezes.  Abdominal: Soft. Bowel sounds are normal. He exhibits no distension. There is no tenderness.  Lymphadenopathy:  He has no cervical adenopathy.  Neurological: He is alert and oriented to person, place, and time.  Skin: Skin is warm and dry. No rash noted. No erythema.  Psychiatric: He has a normal mood and affect. His behavior is normal.     imaging from 3/15: FINDINGS: Three enhancing  intracranial lesions remain, smaller in size than the previously noted lesions. These include:  - RIGHT lentiform nucleus/anterior internal capsule, 9 x 14 mm cross-section, image 14.  - RIGHT frontal periventricular white matter near the forceps minor, 9 x 9 mm cross-section, image 20.  - LEFT frontal subcortical white matter, 9 x 9 mm cross-section, image 25.  - possible 1 mm focus versus enhancing vessel medial LEFT frontal parasagittal cortex, image 22 requires continued surveillance.  Moderate vasogenic edema accompanies the RIGHT frontal and LEFT frontal lesions. No midline shift.  Generalized atrophy. Chronic microvascular ischemic changes superimposed of a mild degree. Vascular calcification. No sinus or mastoid disease. Calvarium intact.  IMPRESSION: Residual brain abscesses persist but are smaller compared with most recent priors. Continued surveillance is warranted.  Noncontrast imaging does not appear to be helpful in assessment of lesion progression; post infusion MR or CT is necessary to assess lesions size.     Assessment & Plan:  Brain abscess  = based upon studies from [facility], brain lesions are smaller but still evident. We will continue his oral antibiotics of amox/clav for additional 4-6wk prior to repeat imaging to decide when to stop therapy.  - pvd, seeing vascular tomorrow  rtc in 4 wk

## 2014-03-23 ENCOUNTER — Other Ambulatory Visit: Payer: Self-pay | Admitting: Radiology

## 2014-03-23 ENCOUNTER — Ambulatory Visit (INDEPENDENT_AMBULATORY_CARE_PROVIDER_SITE_OTHER): Payer: Medicaid Other | Admitting: Vascular Surgery

## 2014-03-23 ENCOUNTER — Other Ambulatory Visit: Payer: Self-pay

## 2014-03-23 ENCOUNTER — Ambulatory Visit (HOSPITAL_COMMUNITY)
Admission: RE | Admit: 2014-03-23 | Discharge: 2014-03-23 | Disposition: A | Discharge status: Home / Self Care | Payer: Medicaid Other | Source: Ambulatory Visit | Attending: Vascular Surgery | Admitting: Vascular Surgery

## 2014-03-23 ENCOUNTER — Encounter: Payer: Self-pay | Admitting: Vascular Surgery

## 2014-03-23 VITALS — BP 131/83 | HR 101 | Ht 73.0 in | Wt 137.0 lb

## 2014-03-23 DIAGNOSIS — Z48812 Encounter for surgical aftercare following surgery on the circulatory system: Secondary | ICD-10-CM

## 2014-03-23 DIAGNOSIS — T82858D Stenosis of vascular prosthetic devices, implants and grafts, subsequent encounter: Secondary | ICD-10-CM

## 2014-03-23 DIAGNOSIS — M79604 Pain in right leg: Secondary | ICD-10-CM

## 2014-03-23 DIAGNOSIS — R208 Other disturbances of skin sensation: Secondary | ICD-10-CM

## 2014-03-23 DIAGNOSIS — Y832 Surgical operation with anastomosis, bypass or graft as the cause of abnormal reaction of the patient, or of later complication, without mention of misadventure at the time of the procedure: Secondary | ICD-10-CM | POA: Insufficient documentation

## 2014-03-23 DIAGNOSIS — T82858A Stenosis of vascular prosthetic devices, implants and grafts, initial encounter: Secondary | ICD-10-CM | POA: Diagnosis present

## 2014-03-23 DIAGNOSIS — R200 Anesthesia of skin: Secondary | ICD-10-CM
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
## 2014-03-26 ENCOUNTER — Ambulatory Visit: Payer: Self-pay | Admitting: Dietician

## 2014-03-26 ENCOUNTER — Other Ambulatory Visit: Payer: Self-pay | Admitting: Vascular Surgery

## 2014-03-26 ENCOUNTER — Encounter: Payer: Self-pay | Admitting: Vascular Surgery

## 2014-03-26 ENCOUNTER — Ambulatory Visit: Payer: Self-pay | Admitting: Internal Medicine

## 2014-03-26 ENCOUNTER — Ambulatory Visit (HOSPITAL_COMMUNITY)
Admission: RE | Admit: 2014-03-26 | Discharge: 2014-03-26 | Disposition: A | Discharge status: Home / Self Care | Payer: Medicaid Other | Source: Ambulatory Visit | Attending: Interventional Radiology | Admitting: Interventional Radiology

## 2014-03-26 ENCOUNTER — Encounter (HOSPITAL_COMMUNITY): Payer: Self-pay

## 2014-03-26 VITALS — BP 137/59 | HR 83 | Temp 97.0°F | Resp 12

## 2014-03-26 DIAGNOSIS — I509 Heart failure, unspecified: Secondary | ICD-10-CM | POA: Diagnosis not present

## 2014-03-26 DIAGNOSIS — I10 Essential (primary) hypertension: Secondary | ICD-10-CM | POA: Insufficient documentation

## 2014-03-26 DIAGNOSIS — T82898A Other specified complication of vascular prosthetic devices, implants and grafts, initial encounter: Secondary | ICD-10-CM | POA: Diagnosis present

## 2014-03-26 DIAGNOSIS — T82858D Stenosis of vascular prosthetic devices, implants and grafts, subsequent encounter: Secondary | ICD-10-CM

## 2014-03-26 DIAGNOSIS — T82858A Stenosis of vascular prosthetic devices, implants and grafts, initial encounter: Secondary | ICD-10-CM | POA: Insufficient documentation

## 2014-03-26 DIAGNOSIS — Z791 Long term (current) use of non-steroidal anti-inflammatories (NSAID): Secondary | ICD-10-CM | POA: Diagnosis not present

## 2014-03-26 DIAGNOSIS — Z7952 Long term (current) use of systemic steroids: Secondary | ICD-10-CM | POA: Insufficient documentation

## 2014-03-26 DIAGNOSIS — T82868A Thrombosis of vascular prosthetic devices, implants and grafts, initial encounter: Secondary | ICD-10-CM | POA: Diagnosis not present

## 2014-03-26 DIAGNOSIS — I70203 Unspecified atherosclerosis of native arteries of extremities, bilateral legs: Secondary | ICD-10-CM | POA: Insufficient documentation

## 2014-03-26 DIAGNOSIS — Z8673 Personal history of transient ischemic attack (TIA), and cerebral infarction without residual deficits: Secondary | ICD-10-CM | POA: Diagnosis not present

## 2014-03-26 DIAGNOSIS — J449 Chronic obstructive pulmonary disease, unspecified: Secondary | ICD-10-CM | POA: Diagnosis not present

## 2014-03-26 DIAGNOSIS — G894 Chronic pain syndrome: Secondary | ICD-10-CM | POA: Insufficient documentation

## 2014-03-26 DIAGNOSIS — E119 Type 2 diabetes mellitus without complications: Secondary | ICD-10-CM | POA: Insufficient documentation

## 2014-03-26 DIAGNOSIS — K219 Gastro-esophageal reflux disease without esophagitis: Secondary | ICD-10-CM | POA: Diagnosis not present

## 2014-03-26 DIAGNOSIS — Z79899 Other long term (current) drug therapy: Secondary | ICD-10-CM | POA: Insufficient documentation

## 2014-03-26 DIAGNOSIS — E785 Hyperlipidemia, unspecified: Secondary | ICD-10-CM | POA: Insufficient documentation

## 2014-03-26 DIAGNOSIS — G4730 Sleep apnea, unspecified: Secondary | ICD-10-CM | POA: Insufficient documentation

## 2014-03-26 DIAGNOSIS — T82898D Other specified complication of vascular prosthetic devices, implants and grafts, subsequent encounter: Secondary | ICD-10-CM

## 2014-03-26 DIAGNOSIS — M79671 Pain in right foot: Secondary | ICD-10-CM | POA: Diagnosis present

## 2014-03-26 DIAGNOSIS — F17210 Nicotine dependence, cigarettes, uncomplicated: Secondary | ICD-10-CM | POA: Diagnosis not present

## 2014-03-26 LAB — POCT ACTIVATED CLOTTING TIME
ACTIVATED CLOTTING TIME: 196 s
Activated Clotting Time: 177 seconds
Activated Clotting Time: 214 seconds
Activated Clotting Time: 294 seconds
Activated Clotting Time: 177 seconds

## 2014-03-26 LAB — CBC WITH DIFFERENTIAL/PLATELET
BASOS PCT: 1 % (ref 0–1)
Basophils Absolute: 0 10*3/uL (ref 0.0–0.1)
EOS ABS: 0.5 10*3/uL (ref 0.0–0.7)
EOS PCT: 7 % — AB (ref 0–5)
HEMATOCRIT: 39 % (ref 39.0–52.0)
HEMOGLOBIN: 13.1 g/dL (ref 13.0–17.0)
LYMPHS ABS: 2.8 10*3/uL (ref 0.7–4.0)
LYMPHS PCT: 42 % (ref 12–46)
MCH: 30.7 pg (ref 26.0–34.0)
MCHC: 33.6 g/dL (ref 30.0–36.0)
MCV: 91.3 fL (ref 78.0–100.0)
Monocytes Absolute: 0.5 10*3/uL (ref 0.1–1.0)
Monocytes Relative: 8 % (ref 3–12)
Neutro Abs: 2.9 10*3/uL (ref 1.7–7.7)
Neutrophils Relative %: 42 % — ABNORMAL LOW (ref 43–77)
Platelets: 231 10*3/uL (ref 150–400)
RBC: 4.27 MIL/uL (ref 4.22–5.81)
RDW: 14.2 % (ref 11.5–15.5)
WBC: 6.8 10*3/uL (ref 4.0–10.5)

## 2014-03-26 LAB — BASIC METABOLIC PANEL
ANION GAP: 8 (ref 5–15)
BUN: 14 mg/dL (ref 6–23)
CO2: 26 mmol/L (ref 19–32)
Calcium: 9.7 mg/dL (ref 8.4–10.5)
Chloride: 106 mmol/L (ref 96–112)
Creatinine, Ser: 0.75 mg/dL (ref 0.50–1.35)
GFR calc Af Amer: >90 mL/min (ref >90)
GLUCOSE: 103 mg/dL — AB (ref 70–99)
POTASSIUM: 4 mmol/L (ref 3.5–5.1)
SODIUM: 140 mmol/L (ref 135–145)

## 2014-03-26 LAB — GLUCOSE, CAPILLARY: Glucose-Capillary: 105 mg/dL — ABNORMAL HIGH (ref 70–99)

## 2014-03-26 LAB — PROTIME-INR
INR: 1.04 (ref 0.00–1.49)
Prothrombin Time: 13.7 seconds (ref 11.6–15.2)

## 2014-03-26 MED ORDER — HEPARIN BOLUS VIA INFUSION
4000.0000 [IU] | Freq: Once | INTRAVENOUS | Status: DC
  Filled 2014-03-26: qty 4000

## 2014-03-26 MED ORDER — HEPARIN SOD (PORK) LOCK FLUSH 100 UNIT/ML IV SOLN
INTRAVENOUS | Status: AC
  Filled 2014-03-26: qty 10

## 2014-03-26 MED ORDER — ALTEPLASE 100 MG IV SOLR
4.0000 mg | Freq: Once | INTRAVENOUS | Status: DC
  Filled 2014-03-26: qty 4

## 2014-03-26 MED ORDER — SODIUM CHLORIDE 0.9 % IV SOLN
INTRAVENOUS | Status: DC
  Administered 2014-03-26: 07:00:00 via INTRAVENOUS
  Filled 2014-03-26 (×2): qty 1000

## 2014-03-26 MED ORDER — IOHEXOL 300 MG/ML  SOLN: 50.0000 mL | Freq: Once | INTRAMUSCULAR | Status: DC | PRN

## 2014-03-26 MED ORDER — HEPARIN SODIUM (PORCINE) 1000 UNIT/ML IJ SOLN
INTRAMUSCULAR | Status: AC | PRN
  Administered 2014-03-26: 4000 [IU] via INTRAVENOUS
  Administered 2014-03-26: 3000 [IU] via INTRAVENOUS
  Administered 2014-03-26: 4000 [IU] via INTRAVENOUS
  Administered 2014-03-26: 3000 [IU] via INTRAVENOUS

## 2014-03-26 MED ORDER — FENTANYL CITRATE 0.05 MG/ML IJ SOLN
INTRAMUSCULAR | Status: AC
  Filled 2014-03-26: qty 4

## 2014-03-26 MED ORDER — FENTANYL CITRATE 0.05 MG/ML IJ SOLN
INTRAMUSCULAR | Status: AC | PRN
  Administered 2014-03-26 (×8): 25 ug via INTRAVENOUS
  Administered 2014-03-26: 50 ug via INTRAVENOUS

## 2014-03-26 MED ORDER — HEPARIN (PORCINE) IN NACL 100-0.45 UNIT/ML-% IJ SOLN
1050.0000 [IU]/h | INTRAMUSCULAR | Status: DC
  Filled 2014-03-26 (×11): qty 250

## 2014-03-26 MED ORDER — IODIXANOL 320 MG/ML IV SOLN
100.0000 mL | Freq: Once | INTRAVENOUS | Status: AC | PRN
  Administered 2014-03-26: 125 mL via INTRAVENOUS

## 2014-03-26 MED ORDER — LIDOCAINE HCL 1 % IJ SOLN
INTRAMUSCULAR | Status: AC
  Filled 2014-03-26: qty 20

## 2014-03-26 MED ORDER — MIDAZOLAM HCL 2 MG/2ML IJ SOLN
INTRAMUSCULAR | Status: AC | PRN
  Administered 2014-03-26: 0.5 mg via INTRAVENOUS
  Administered 2014-03-26 (×2): 1 mg via INTRAVENOUS
  Administered 2014-03-26 (×5): 0.5 mg via INTRAVENOUS

## 2014-03-26 MED ORDER — HEPARIN SODIUM (PORCINE) 1000 UNIT/ML IJ SOLN
INTRAMUSCULAR | Status: AC
  Filled 2014-03-26: qty 1

## 2014-03-26 MED ORDER — MIDAZOLAM HCL 2 MG/2ML IJ SOLN
INTRAMUSCULAR | Status: AC
  Filled 2014-03-26: qty 4

## 2014-03-26 NOTE — Progress Notes (Addendum)
Pt does not have a family member for a ride and is taking public transportation. He does have his brother at home for assistance when he gets out of work at approx 1730. Jannifer Franklin PA notified, she will call back with further instruction.  Pt is out of bed, refuses to wait to leave. Pt removed his own IV, tape was applied.  Jannifer Franklin PA informed. AMA paperwork is being located to be signed. Signature was obtained.
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
## 2014-03-26 NOTE — Procedures (Signed)
Interventional Radiology Procedure Note  Procedure: Left CFA access.  Right LE angiogram.  Mechanical/Aspiration Thrombectomy of occluded Right Fem-pop graft.  Balloon angioplasty of fem-pop, including origin.  Attempted crossing of occlusion of the distal anastamosis.  Could not cross.   Findings:  Occlusion of right fem-pop graft distally.  Likely chronic stenosis has become occluded.  Complications: No immediate Recommendations:  - Continue heparin until disposition with surgical team is established.  - Maintain right CFA sheath until disposition established.  - Bedrest with logroll.  - Admit to observation, pending surgical disposition.  Signed,  Dulcy Fanny. Earleen Newport, DO

## 2014-03-26 NOTE — Sedation Documentation (Signed)
Pt c/o pain 9/10 in both legs.

## 2014-03-26 NOTE — Sedation Documentation (Addendum)
Pt awake and c/o pain to bilateral lower extremities 6/10 on pain scale.  Stated he was worried he would spontaneously move one of his legs d/t discomfort lying flat on procedure table.

## 2014-03-26 NOTE — Discharge Instructions (Signed)

## 2014-03-26 NOTE — Progress Notes (Signed)
Per radiology, dc per exoseal order, 2 hr dc, pt is upset because he thinks he can go now. I have explained bed rest time. Pt is verbalizing that he wants to leave early.

## 2014-03-26 NOTE — Sedation Documentation (Signed)
Patient is resting comfortably. 

## 2014-03-26 NOTE — Sedation Documentation (Addendum)
Received report from Presley Raddle, RN.

## 2014-03-26 NOTE — Progress Notes (Unsigned)
Patient ID: [name], male   DOB: 1960/03/04, 54 y.o.   MRN: [id number] Arteriogram films reviewed and discussed with Dr. Earleen Newport.  . Occlusion of below-knee popliteal artery distal to prior right femoral to below-knee popliteal bypass. Reconstitutes diseased anterior tibial and peroneal artery Discussed with the patient and post procedure holding area. Fortunately he has claudication but no rest pain. Explained that we would not recommend re-intervention unless he has threatened limb loss. I will see him in the office in 2 weeks. At that time we will also image his left leg. He has not had vein harvested from his left leg and if surgery is required would require bypass to the tibia. Explained to the patient did extremely poor long-term durability of prosthetic graft to tibial artery. Okay for discharge to home today

## 2014-03-27 ENCOUNTER — Encounter: Payer: Self-pay | Admitting: Internal Medicine

## 2014-03-27 ENCOUNTER — Ambulatory Visit
Admission: RE | Admit: 2014-03-27 | Discharge: 2014-03-27 | Disposition: A | Discharge status: Home / Self Care | Payer: Medicaid Other | Source: Ambulatory Visit | Attending: Neurological Surgery | Admitting: Neurological Surgery

## 2014-03-27 ENCOUNTER — Other Ambulatory Visit: Payer: Self-pay | Admitting: *Deleted

## 2014-03-27 DIAGNOSIS — G060 Intracranial abscess and granuloma: Secondary | ICD-10-CM

## 2014-03-27 DIAGNOSIS — Z01810 Encounter for preprocedural cardiovascular examination: Secondary | ICD-10-CM

## 2014-03-27 DIAGNOSIS — I739 Peripheral vascular disease, unspecified: Secondary | ICD-10-CM

## 2014-03-27 DIAGNOSIS — F1910 Other psychoactive substance abuse, uncomplicated: Secondary | ICD-10-CM | POA: Insufficient documentation

## 2014-03-27 MED ORDER — IOPAMIDOL (ISOVUE-300) INJECTION 61%
75.0000 mL | Freq: Once | INTRAVENOUS | Status: AC | PRN
  Administered 2014-03-27: 75 mL via INTRAVENOUS

## 2014-03-28 ENCOUNTER — Telehealth: Payer: Self-pay | Admitting: Vascular Surgery

## 2014-03-28 ENCOUNTER — Other Ambulatory Visit: Payer: Self-pay | Admitting: *Deleted

## 2014-03-28 ENCOUNTER — Ambulatory Visit: Payer: Medicaid Other | Admitting: Podiatry

## 2014-03-28 ENCOUNTER — Telehealth: Payer: Self-pay | Admitting: *Deleted

## 2014-03-28 MED ORDER — AMOXICILLIN 875 MG PO TABS: 875.0000 mg | ORAL_TABLET | Freq: Two times a day (BID) | ORAL | Status: DC

## 2014-03-28 NOTE — Telephone Encounter (Signed)
-----   Message from Mena Goes, RN sent at 03/27/2014  9:53 AM EDT ----- Regarding: Schedule with vein map    ----- Message -----    From: [name], MD    Sent: 03/26/2014   2:08 PM      To: [facility] Charge Pool  Patient had arteriogram with attempted of lysis by interventional radiology today. This was unsuccessful. He will be discharged to home. I need to see him in 2 weeks for follow-up in the office. Also needs vein map of his left leg to determine if he has adequate saphenous vein for right fem-tib bypass if indicated.

## 2014-03-28 NOTE — Telephone Encounter (Signed)
Error

## 2014-03-28 NOTE — Telephone Encounter (Signed)
Per Dr Baxter Flattery called the patient and advised to continue taking the Amox 875 BID for 2 months. He was ok with this and reminded him of his follow up appt.

## 2014-03-28 NOTE — Telephone Encounter (Signed)
Spoke with patient, dpm °

## 2014-03-28 NOTE — Telephone Encounter (Signed)
-----   Message from Carlyle Basques, MD sent at 03/28/2014 12:30 PM EDT ----- Can you call him to let him know i saw his head ct. There is still evidence of small brain abscess. i recommend he continue taking amox/clav 875mg  BID for addn 2 months. Can you call in rx to his pharmacy of choice. We will see him back in 4 wk

## 2014-03-29 ENCOUNTER — Telehealth: Payer: Self-pay

## 2014-03-29 NOTE — Telephone Encounter (Signed)
Phone call from pt.   Requested pain medication for bilateral leg pain with walking.  Stated his right foot keeps him awake at night.  Reported the pain is the same as 03/23/14 when he was evaluated @ the office.  Pt. Is requesting "Percocet."  Stated "the Vicodin makes me sick."  Discussed with Dr. Donnetta Hutching.  Advised that no narcotic pain medication will be ordered.  Call returned to pt.  Left voice message re: Dr. Luther Parody recommendation.

## 2014-04-09 ENCOUNTER — Ambulatory Visit: Payer: Self-pay | Admitting: Surgery

## 2014-04-09 ENCOUNTER — Encounter (HOSPITAL_COMMUNITY): Payer: Self-pay

## 2014-04-09 ENCOUNTER — Other Ambulatory Visit: Payer: Self-pay | Admitting: Internal Medicine

## 2014-04-09 ENCOUNTER — Encounter: Payer: Self-pay | Admitting: Vascular Surgery

## 2014-04-10 ENCOUNTER — Other Ambulatory Visit: Payer: Self-pay | Admitting: Vascular Surgery

## 2014-04-10 ENCOUNTER — Ambulatory Visit (HOSPITAL_COMMUNITY)
Admission: RE | Admit: 2014-04-10 | Discharge: 2014-04-10 | Disposition: A | Discharge status: Home / Self Care | Payer: Medicaid Other | Source: Ambulatory Visit | Attending: Vascular Surgery | Admitting: Vascular Surgery

## 2014-04-10 ENCOUNTER — Other Ambulatory Visit: Payer: Self-pay

## 2014-04-10 ENCOUNTER — Ambulatory Visit (INDEPENDENT_AMBULATORY_CARE_PROVIDER_SITE_OTHER): Payer: Medicaid Other | Admitting: Vascular Surgery

## 2014-04-10 ENCOUNTER — Encounter: Payer: Self-pay | Admitting: Vascular Surgery

## 2014-04-10 VITALS — BP 124/79 | HR 88 | Resp 18 | Ht 73.0 in | Wt 144.0 lb

## 2014-04-10 DIAGNOSIS — Z01810 Encounter for preprocedural cardiovascular examination: Secondary | ICD-10-CM

## 2014-04-10 DIAGNOSIS — I70229 Atherosclerosis of native arteries of extremities with rest pain, unspecified extremity: Secondary | ICD-10-CM | POA: Diagnosis not present

## 2014-04-10 DIAGNOSIS — I739 Peripheral vascular disease, unspecified: Secondary | ICD-10-CM
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
## 2014-04-10 NOTE — Pre-Procedure Instructions (Signed)
[name]  04/10/2014   Your procedure is scheduled on:  Monday, April 16, 2014  Report to [facility] Admitting at 5:30 AM.  Call this number if you have problems the morning of surgery: [phone]   Remember: Follow doctors instructions regarding Xarelto   Do not eat food or drink liquids after midnight Sunday, April 15, 2014   Take these medicines the morning of surgery with A SIP OF WATER: levETIRAcetam (KEPPRA),  pantoprazole (PROTONIX), PARoxetine (PAXIL), albuterol (PROVENTIL HFA;VENTOLIN HFA)  Inhaler, Fluticasone-Salmeterol (ADVAIR DISKUS), if needed:clonazePAM (KLONOPIN) for anxiety, ondansetron (ZOFRAN) for nausea or vomiting, ranitidine (ZANTAC) for heartburn, Percocet for pain,  Ipratropium-Albuterol (COMBIVENT RESPIMAT) for wheezing or shortness of breath ( bring inhaler in with you on day of procedure) .  Stop taking vitamins and herbal medications. Do not take any NSAIDs ie: Ibuprofen, Advil, Naproxen and etc.; stop now.    Do not wear jewelry, make-up or nail polish.  Do not wear lotions, powders, or perfumes. You may not wear deodorant.  Do not shave 48 hours prior to surgery. Men may shave face and neck.  Do not bring valuables to the hospital.  [facility] is not responsible for any belongings or valuables.               Contacts, dentures or bridgework may not be worn into surgery.  Leave suitcase in the car. After surgery it may be brought to your room.  For patients admitted to the hospital, discharge time is determined by your treatment team.               Patients discharged the day of surgery will not be allowed to drive home.  Name and phone number of your driver:   Special Instructions:  Special Instructions:Special Instructions: [facility] - Preparing for Surgery  Before surgery, you can play an important role.  Because skin is not sterile, your skin needs to be as free of germs as possible.  You can reduce the number of germs on you skin by  washing with CHG (chlorahexidine gluconate) soap before surgery.  CHG is an antiseptic cleaner which kills germs and bonds with the skin to continue killing germs even after washing.  Please DO NOT use if you have an allergy to CHG or antibacterial soaps.  If your skin becomes reddened/irritated stop using the CHG and inform your nurse when you arrive at Short Stay.  Do not shave (including legs and underarms) for at least 48 hours prior to the first CHG shower.  You may shave your face.  Please follow these instructions carefully:   1.  Shower with CHG Soap the night before surgery and the morning of Surgery.  2.  If you choose to wash your hair, wash your hair first as usual with your normal shampoo.  3.  After you shampoo, rinse your hair and body thoroughly to remove the Shampoo.  4.  Use CHG as you would any other liquid soap.  You can apply chg directly  to the skin and wash gently with scrungie or a clean washcloth.  5.  Apply the CHG Soap to your body ONLY FROM THE NECK DOWN.  Do not use on open wounds or open sores.  Avoid contact with your eyes, ears, mouth and genitals (private parts).  Wash genitals (private parts) with your normal soap.  6.  Wash thoroughly, paying special attention to the area where your surgery will be performed.  7.  Thoroughly rinse  your body with warm water from the neck down.  8.  DO NOT shower/wash with your normal soap after using and rinsing off the CHG Soap.  9.  Pat yourself dry with a clean towel.            10.  Wear clean pajamas.            11.  Place clean sheets on your bed the night of your first shower and do not sleep with pets.  Day of Surgery  Do not apply any lotions/deodorants the morning of surgery.  Please wear clean clothes to the hospital/surgery center.   Please read over the following fact sheets that you were given: Pain Booklet, Coughing and Deep Breathing, Blood Transfusion Information, Total Joint Packet, MRSA Information and  Surgical Site Infection Prevention

## 2014-04-11 ENCOUNTER — Telehealth: Payer: Self-pay

## 2014-04-11 ENCOUNTER — Encounter (HOSPITAL_COMMUNITY)
Admission: RE | Admit: 2014-04-11 | Discharge: 2014-04-11 | Disposition: A | Discharge status: Home / Self Care | Payer: Medicaid Other | Source: Ambulatory Visit | Attending: Vascular Surgery | Admitting: Vascular Surgery

## 2014-04-11 ENCOUNTER — Encounter (HOSPITAL_COMMUNITY): Payer: Self-pay

## 2014-04-11 ENCOUNTER — Ambulatory Visit: Payer: Medicaid Other | Admitting: Podiatry

## 2014-04-11 DIAGNOSIS — Z01812 Encounter for preprocedural laboratory examination: Secondary | ICD-10-CM | POA: Insufficient documentation

## 2014-04-11 HISTORY — DX: Pneumonia, unspecified organism: J18.9

## 2014-04-11 HISTORY — DX: Headache: R51

## 2014-04-11 HISTORY — DX: Rheumatoid arthritis, unspecified: M06.9

## 2014-04-11 HISTORY — DX: Unspecified asthma, uncomplicated: J45.909

## 2014-04-11 HISTORY — DX: Personal history of other diseases of the digestive system: Z87.19

## 2014-04-11 HISTORY — DX: Depression, unspecified: F32.A

## 2014-04-11 HISTORY — DX: Other pulmonary embolism without acute cor pulmonale: I26.99

## 2014-04-11 HISTORY — DX: Headache, unspecified: R51.9

## 2014-04-11 HISTORY — DX: Major depressive disorder, single episode, unspecified: F32.9

## 2014-04-11 LAB — URINALYSIS, ROUTINE W REFLEX MICROSCOPIC
Bilirubin Urine: NEGATIVE
Glucose, UA: NEGATIVE mg/dL
Hgb urine dipstick: NEGATIVE
Ketones, ur: NEGATIVE mg/dL
LEUKOCYTES UA: NEGATIVE
Nitrite: NEGATIVE
PH: 5.5 (ref 5.0–8.0)
Protein, ur: NEGATIVE mg/dL
Specific Gravity, Urine: 1.002 — ABNORMAL LOW (ref 1.005–1.030)
Urobilinogen, UA: 0.2 mg/dL (ref 0.0–1.0)

## 2014-04-11 LAB — COMPREHENSIVE METABOLIC PANEL
ALT: 14 U/L (ref 0–53)
AST: 22 U/L (ref 0–37)
Albumin: 4 g/dL (ref 3.5–5.2)
Alkaline Phosphatase: 96 U/L (ref 39–117)
Anion gap: 10 (ref 5–15)
BUN: 15 mg/dL (ref 6–23)
CHLORIDE: 105 mmol/L (ref 96–112)
CO2: 26 mmol/L (ref 19–32)
Calcium: 9.9 mg/dL (ref 8.4–10.5)
Creatinine, Ser: 0.96 mg/dL (ref 0.50–1.35)
GFR calc non Af Amer: >90 mL/min (ref >90)
Glucose, Bld: 84 mg/dL (ref 70–99)
Potassium: 3.8 mmol/L (ref 3.5–5.1)
Sodium: 141 mmol/L (ref 135–145)
TOTAL PROTEIN: 7.3 g/dL (ref 6.0–8.3)
Total Bilirubin: 0.2 mg/dL — ABNORMAL LOW (ref 0.3–1.2)

## 2014-04-11 LAB — CBC
HEMATOCRIT: 41.1 % (ref 39.0–52.0)
Hemoglobin: 14 g/dL (ref 13.0–17.0)
MCH: 31.9 pg (ref 26.0–34.0)
MCHC: 34.1 g/dL (ref 30.0–36.0)
MCV: 93.6 fL (ref 78.0–100.0)
Platelets: 280 10*3/uL (ref 150–400)
RBC: 4.39 MIL/uL (ref 4.22–5.81)
RDW: 13.7 % (ref 11.5–15.5)
WBC: 10.6 10*3/uL — ABNORMAL HIGH (ref 4.0–10.5)

## 2014-04-11 LAB — SURGICAL PCR SCREEN
MRSA, PCR: NEGATIVE
STAPHYLOCOCCUS AUREUS: NEGATIVE

## 2014-04-11 LAB — TYPE AND SCREEN
ABO/RH(D): A POS
Antibody Screen: NEGATIVE

## 2014-04-11 LAB — APTT: APTT: 46 s — AB (ref 24–37)

## 2014-04-11 LAB — PROTIME-INR
INR: 1.39 (ref 0.00–1.49)
Prothrombin Time: 17.2 seconds — ABNORMAL HIGH (ref 11.6–15.2)

## 2014-04-11 NOTE — Telephone Encounter (Signed)
-----   Message from Kelby Aline, MD sent at 04/11/2014  1:13 PM EDT ----- Regarding: RE: Xarelto  We see him tomorrow and will tell him to stop xarelto after tomorrow. It does not require a bridge. We ask you to instruct him to resume the xarelto as early as is safe on your end.  Thanks, Adam ----- Message -----    From: Denman George, RN    Sent: 04/10/2014   1:22 PM      To: Kelby Aline, MD Subject: Xarelto                                        This pt. Is scheduled for Right Femoral- Anterior Tibial Bypass with left leg vein on Monday, 04/16/14.  We have instructed the pt. to hold his Xarelto 3 days prior ; last dose to be taken 04/12/14.  Does he need a Lovenox bridge?   If so, can your office arrange this for the pt.?

## 2014-04-11 NOTE — Progress Notes (Signed)
Pt denies SOB, chest pain, and being under the care of a cardiologist. Pt chart forwarded to [facility], [place] ( anesthesia) to review complex medical history. Pt stated that he was instructed to take his last dose of Xarelto " tomorrow."
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
## 2014-04-12 ENCOUNTER — Encounter: Payer: Self-pay | Admitting: Internal Medicine

## 2014-04-12 ENCOUNTER — Ambulatory Visit (INDEPENDENT_AMBULATORY_CARE_PROVIDER_SITE_OTHER): Payer: Medicaid Other | Admitting: Internal Medicine

## 2014-04-12 ENCOUNTER — Ambulatory Visit: Payer: Medicaid Other | Admitting: Dietician

## 2014-04-12 VITALS — BP 129/62 | HR 86 | Temp 97.5°F | Ht 73.0 in | Wt 144.5 lb

## 2014-04-12 DIAGNOSIS — G060 Intracranial abscess and granuloma: Secondary | ICD-10-CM

## 2014-04-12 DIAGNOSIS — I739 Peripheral vascular disease, unspecified: Secondary | ICD-10-CM

## 2014-04-12 DIAGNOSIS — F411 Generalized anxiety disorder: Secondary | ICD-10-CM

## 2014-04-12 MED ORDER — CLONAZEPAM 1 MG PO TABS
0.5000 mg | ORAL_TABLET | Freq: Every day | ORAL | Status: DC
Start: 2014-04-12 — End: 2014-04-17

## 2014-04-12 NOTE — Patient Instructions (Signed)
It was a pleasure to see you today. We are continuing your klonipin taper. You can take 0.5 mg once a day. We will see you back in one month and lower the dose again at that time. Stop taking the xarelto after today. Continue the antibiotic and follow-up with Dr Baxter Flattery. Please return to clinic or seek medical attention if you have any new or worsening confusion, weakness, or other worrisome medical condition. We look forward to seeing you again in one month.  Lottie Mussel, MD  General Instructions:   Please try to bring all your medicines next time. This will help [place] keep you safe from mistakes.   Progress Toward Treatment Goals:  Treatment Goal 03/13/2014  Hemoglobin A1C at goal  Blood pressure at goal  Stop smoking smoking the same amount  Prevent falls -    Self Care Goals & Plans:  Self Care Goal 03/13/2014  Manage my medications take my medicines as prescribed; bring my medications to every visit; refill my medications on time  Monitor my health -  Eat healthy foods drink diet soda or water instead of juice or soda; eat more vegetables; eat foods that are low in salt; eat baked foods instead of fried foods; eat fruit for snacks and desserts  Be physically active -  Stop smoking -  Meeting treatment goals maintain the current self-care plan    Home Blood Glucose Monitoring 09/26/2012  Check my blood sugar no home glucose monitoring  When to check my blood sugar N/A     Care Management & Community Referrals:  Referral 09/26/2012  Referrals made for care management support none needed  Referrals made to community resources none

## 2014-04-12 NOTE — Progress Notes (Signed)
Patient did not want to see dietitian today.

## 2014-04-12 NOTE — Progress Notes (Signed)
Case discussed with Dr. Rothman at time of visit. We reviewed the resident's history and exam and pertinent patient test results. I agree with the assessment, diagnosis, and plan of care documented in the resident's note. 

## 2014-04-12 NOTE — Progress Notes (Signed)
   Subjective:    Patient ID: [name], male    DOB: 11-Aug-1960, 54 y.o.   MRN: [id number]  HPI  Mr Mulrooney is a 54 year old man with brain abscesses, PE on xarelto, PVD s/p several stent procedures, HTN, DM2, substance abuse here on follow-up of his several co-morbidities. Please see problem-based charting assessment and plan note for further details of medical issues addressed at today's visit.   Review of Systems  Constitutional: Negative for fever, chills and diaphoresis.  Respiratory: Negative for shortness of breath.   Cardiovascular: Negative for chest pain.  Gastrointestinal: Negative for nausea, vomiting, abdominal pain, diarrhea and constipation.  Musculoskeletal: Positive for arthralgias.  Neurological: Negative for dizziness, speech difficulty, weakness, light-headedness, numbness and headaches.  Psychiatric/Behavioral: The patient is nervous/anxious.        Objective:   Physical Exam  Constitutional: He is oriented to person, place, and time. He appears well-developed and well-nourished. No distress.  HENT:  Head: Normocephalic and atraumatic.  Mouth/Throat: Oropharynx is clear and moist.  Cardiovascular: Normal rate, regular rhythm, normal heart sounds and intact distal pulses.  Exam reveals no gallop and no friction rub.   No murmur heard. Pulmonary/Chest: Effort normal and breath sounds normal. No respiratory distress. He has no wheezes.  Abdominal: Soft. Bowel sounds are normal. He exhibits no distension. There is no tenderness.  Musculoskeletal:  B/l feet warm and perfused, strength and sensation intact  Neurological: He is alert and oriented to person, place, and time. No cranial nerve deficit.  55/ symmetric strength in b/l upper and lower extremities, sensation grossly intact, finger-nose-finger coordination intact but with mild tremor  Skin: He is not diaphoretic.  Vitals reviewed.         Assessment & Plan:

## 2014-04-12 NOTE — Assessment & Plan Note (Signed)
[name] had repeat CT of his head on 3/15 that showed residual brain abscesses persisting but smaller compared to prior and radiology recommends continued surveillance. He saw ID and Dr Baxter Flattery recommends continuing augmentin additional 4-6 weeks and then repeat imaging. He had no neuro deficits on exam today. -cont augmentin per Dr Baxter Flattery of ID

## 2014-04-12 NOTE — Assessment & Plan Note (Signed)
[name] has extensive history of PVD stents including most receently on 03/26/14 occlusion of his below knee popliteal artery distal to prior femoral to below knee popliteal bypass. He had arteriogram with mechanical/aspriation thrombectomy of occluded great and balloon angioplasty. The attempted crossing of occlusion of distal anastamosis could not be crossed. He is scheduled for femoral to anterior tibial bypass on 04/16/14 and because this vascular surgery is a high bleed risk, he is to hold xarelto 20 mg daily after today's dose until he is cleared by [facility] to resume -R femoral to anterior tibial bypass 04/16/14 (appreciate [facility]) -hold xarelto after today 3/31 dose and may resume as soon after surgery as cleared by [facility]
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
## 2014-04-13 LAB — PRESCRIPTION ABUSE MONITORING 15P, URINE
AMPHETAMINE/METH: NEGATIVE ng/mL
Barbiturate Screen, Urine: NEGATIVE ng/mL
Buprenorphine, Urine: NEGATIVE ng/mL
COCAINE METABOLITES: NEGATIVE ng/mL
Cannabinoid Scrn, Ur: NEGATIVE ng/mL
Carisoprodol, Urine: NEGATIVE ng/mL
Creatinine, Urine: 52.82 mg/dL (ref >20.0)
Fentanyl, Ur: NEGATIVE ng/mL
MEPERIDINE UR: NEGATIVE ng/mL
Methadone Screen, Urine: NEGATIVE ng/mL
OXYCODONE SCRN UR: NEGATIVE ng/mL
Opiate Screen, Urine: NEGATIVE ng/mL
Propoxyphene: NEGATIVE ng/mL
Tramadol Scrn, Ur: NEGATIVE ng/mL
Zolpidem, Urine: NEGATIVE ng/mL

## 2014-04-15 MED ORDER — CEFUROXIME SODIUM 1.5 G IJ SOLR
1.5000 g | INTRAMUSCULAR | Status: AC
  Administered 2014-04-16 (×2): 1.5 g via INTRAVENOUS
  Filled 2014-04-15: qty 1.5

## 2014-04-16 ENCOUNTER — Inpatient Hospital Stay (HOSPITAL_COMMUNITY)
Admission: RE | Admit: 2014-04-16 | Discharge: 2014-04-17 | DRG: 254 | Disposition: A | Discharge status: Home / Self Care | Payer: Medicaid Other | Source: Ambulatory Visit | Attending: Vascular Surgery | Admitting: Vascular Surgery

## 2014-04-16 ENCOUNTER — Inpatient Hospital Stay (HOSPITAL_COMMUNITY): Payer: Medicaid Other | Admitting: Emergency Medicine

## 2014-04-16 ENCOUNTER — Inpatient Hospital Stay (HOSPITAL_COMMUNITY): Payer: Medicaid Other | Admitting: Certified Registered Nurse Anesthetist

## 2014-04-16 ENCOUNTER — Encounter (HOSPITAL_COMMUNITY): Payer: Self-pay | Admitting: *Deleted

## 2014-04-16 ENCOUNTER — Encounter (HOSPITAL_COMMUNITY)
Admission: RE | Disposition: A | Discharge status: Home / Self Care | Payer: Self-pay | Source: Ambulatory Visit | Attending: Vascular Surgery

## 2014-04-16 DIAGNOSIS — E785 Hyperlipidemia, unspecified: Secondary | ICD-10-CM | POA: Diagnosis present

## 2014-04-16 DIAGNOSIS — F329 Major depressive disorder, single episode, unspecified: Secondary | ICD-10-CM | POA: Diagnosis present

## 2014-04-16 DIAGNOSIS — Z8673 Personal history of transient ischemic attack (TIA), and cerebral infarction without residual deficits: Secondary | ICD-10-CM | POA: Diagnosis not present

## 2014-04-16 DIAGNOSIS — N529 Male erectile dysfunction, unspecified: Secondary | ICD-10-CM | POA: Diagnosis present

## 2014-04-16 DIAGNOSIS — E119 Type 2 diabetes mellitus without complications: Secondary | ICD-10-CM | POA: Diagnosis present

## 2014-04-16 DIAGNOSIS — M069 Rheumatoid arthritis, unspecified: Secondary | ICD-10-CM | POA: Diagnosis present

## 2014-04-16 DIAGNOSIS — K219 Gastro-esophageal reflux disease without esophagitis: Secondary | ICD-10-CM | POA: Diagnosis present

## 2014-04-16 DIAGNOSIS — Z8614 Personal history of Methicillin resistant Staphylococcus aureus infection: Secondary | ICD-10-CM | POA: Diagnosis not present

## 2014-04-16 DIAGNOSIS — J449 Chronic obstructive pulmonary disease, unspecified: Secondary | ICD-10-CM | POA: Diagnosis present

## 2014-04-16 DIAGNOSIS — I6523 Occlusion and stenosis of bilateral carotid arteries: Secondary | ICD-10-CM | POA: Diagnosis present

## 2014-04-16 DIAGNOSIS — Z201 Contact with and (suspected) exposure to tuberculosis: Secondary | ICD-10-CM | POA: Diagnosis present

## 2014-04-16 DIAGNOSIS — I10 Essential (primary) hypertension: Secondary | ICD-10-CM | POA: Diagnosis present

## 2014-04-16 DIAGNOSIS — Z86711 Personal history of pulmonary embolism: Secondary | ICD-10-CM

## 2014-04-16 DIAGNOSIS — B1920 Unspecified viral hepatitis C without hepatic coma: Secondary | ICD-10-CM | POA: Diagnosis present

## 2014-04-16 DIAGNOSIS — I998 Other disorder of circulatory system: Secondary | ICD-10-CM | POA: Diagnosis not present

## 2014-04-16 DIAGNOSIS — G894 Chronic pain syndrome: Secondary | ICD-10-CM | POA: Diagnosis present

## 2014-04-16 DIAGNOSIS — I70221 Atherosclerosis of native arteries of extremities with rest pain, right leg: Principal | ICD-10-CM | POA: Diagnosis present

## 2014-04-16 DIAGNOSIS — F411 Generalized anxiety disorder: Secondary | ICD-10-CM | POA: Diagnosis present

## 2014-04-16 DIAGNOSIS — I739 Peripheral vascular disease, unspecified: Secondary | ICD-10-CM | POA: Diagnosis present

## 2014-04-16 DIAGNOSIS — I509 Heart failure, unspecified: Secondary | ICD-10-CM | POA: Diagnosis present

## 2014-04-16 DIAGNOSIS — Z23 Encounter for immunization: Secondary | ICD-10-CM | POA: Diagnosis not present

## 2014-04-16 DIAGNOSIS — G4733 Obstructive sleep apnea (adult) (pediatric): Secondary | ICD-10-CM | POA: Diagnosis present

## 2014-04-16 HISTORY — PX: FEMORAL-TIBIAL BYPASS GRAFT: SHX938

## 2014-04-16 HISTORY — PX: ENDARTERECTOMY FEMORAL: SHX5804

## 2014-04-16 HISTORY — PX: VEIN HARVEST: SHX6363

## 2014-04-16 LAB — BENZODIAZEPINES (GC/LC/MS), URINE
ALPRAZOLAMU: NEGATIVE ng/mL (ref <25)
CLONAZEPAU: NEGATIVE ng/mL (ref <25)
Flurazepam metabolite (GC/LC/MS), ur confirm: NEGATIVE ng/mL (ref <50)
Lorazepam (GC/LC/MS), ur confirm: NEGATIVE ng/mL (ref <50)
Midazolam (GC/LC/MS), ur confirm: NEGATIVE ng/mL (ref <50)
Nordiazepam (GC/LC/MS), ur confirm: NEGATIVE ng/mL (ref <50)
OXAZEPAMU: NEGATIVE ng/mL (ref <50)
Temazepam (GC/LC/MS), ur confirm: NEGATIVE ng/mL (ref <50)
Triazolam metabolite (GC/LC/MS), ur confirm: NEGATIVE ng/mL (ref <50)

## 2014-04-16 LAB — GLUCOSE, CAPILLARY
GLUCOSE-CAPILLARY: 84 mg/dL (ref 70–99)
GLUCOSE-CAPILLARY: 174 mg/dL — AB (ref 70–99)
Glucose-Capillary: 101 mg/dL — ABNORMAL HIGH (ref 70–99)

## 2014-04-16 LAB — PROTIME-INR
INR: 1.06 (ref 0.00–1.49)
Prothrombin Time: 13.9 seconds (ref 11.6–15.2)

## 2014-04-16 LAB — APTT: APTT: 30 s (ref 24–37)

## 2014-04-16 SURGERY — CREATION, BYPASS, ARTERIAL, FEMORAL TO TIBIAL, USING GRAFT
Anesthesia: General | Site: Leg Lower | Laterality: Right

## 2014-04-16 MED ORDER — IPRATROPIUM-ALBUTEROL 0.5-2.5 (3) MG/3ML IN SOLN: 3.0000 mL | Freq: Four times a day (QID) | RESPIRATORY_TRACT | Status: DC | PRN

## 2014-04-16 MED ORDER — LIDOCAINE HCL (CARDIAC) 20 MG/ML IV SOLN
INTRAVENOUS | Status: DC | PRN
  Administered 2014-04-16: 60 mg via INTRAVENOUS

## 2014-04-16 MED ORDER — POTASSIUM CHLORIDE CRYS ER 20 MEQ PO TBCR: 20.0000 meq | EXTENDED_RELEASE_TABLET | Freq: Every day | ORAL | Status: DC | PRN

## 2014-04-16 MED ORDER — PAROXETINE HCL 20 MG PO TABS
20.0000 mg | ORAL_TABLET | Freq: Every day | ORAL | Status: DC
  Administered 2014-04-16 – 2014-04-17 (×2): 20 mg via ORAL
  Filled 2014-04-16 (×2): qty 1

## 2014-04-16 MED ORDER — OXYCODONE-ACETAMINOPHEN 5-325 MG PO TABS
1.0000 | ORAL_TABLET | ORAL | Status: DC | PRN
  Administered 2014-04-16 – 2014-04-17 (×5): 2 via ORAL
  Filled 2014-04-16 (×5): qty 2

## 2014-04-16 MED ORDER — HEPARIN SODIUM (PORCINE) 1000 UNIT/ML IJ SOLN
INTRAMUSCULAR | Status: AC
Start: 2014-04-16 — End: 2014-04-16
  Filled 2014-04-16: qty 1

## 2014-04-16 MED ORDER — BISACODYL 10 MG RE SUPP: 10.0000 mg | Freq: Every day | RECTAL | Status: DC | PRN

## 2014-04-16 MED ORDER — PHENYLEPHRINE HCL 10 MG/ML IJ SOLN
20.0000 mg | INTRAVENOUS | Status: DC | PRN
  Administered 2014-04-16: 10 ug/min via INTRAVENOUS

## 2014-04-16 MED ORDER — PNEUMOCOCCAL VAC POLYVALENT 25 MCG/0.5ML IJ INJ
0.5000 mL | INJECTION | INTRAMUSCULAR | Status: AC
  Administered 2014-04-17: 0.5 mL via INTRAMUSCULAR
  Filled 2014-04-16: qty 0.5

## 2014-04-16 MED ORDER — PHENOL 1.4 % MT LIQD: 1.0000 | OROMUCOSAL | Status: DC | PRN

## 2014-04-16 MED ORDER — CLONAZEPAM 0.5 MG PO TABS
0.5000 mg | ORAL_TABLET | Freq: Every day | ORAL | Status: DC
Start: 2014-04-16 — End: 2014-04-17
  Administered 2014-04-16 – 2014-04-17 (×2): 0.5 mg via ORAL
  Filled 2014-04-16 (×2): qty 1

## 2014-04-16 MED ORDER — VITAMINS A & D EX OINT
1.0000 "application " | TOPICAL_OINTMENT | Freq: Two times a day (BID) | CUTANEOUS | Status: DC
  Administered 2014-04-16 – 2014-04-17 (×2): 1 via TOPICAL
  Filled 2014-04-16 (×3): qty 5

## 2014-04-16 MED ORDER — PROPOFOL 10 MG/ML IV BOLUS
INTRAVENOUS | Status: AC
  Filled 2014-04-16: qty 20

## 2014-04-16 MED ORDER — MORPHINE SULFATE 2 MG/ML IJ SOLN
2.0000 mg | INTRAMUSCULAR | Status: DC | PRN
  Administered 2014-04-16 – 2014-04-17 (×4): 4 mg via INTRAVENOUS
  Filled 2014-04-16 (×5): qty 2

## 2014-04-16 MED ORDER — SUCCINYLCHOLINE CHLORIDE 20 MG/ML IJ SOLN
INTRAMUSCULAR | Status: AC
  Filled 2014-04-16: qty 1

## 2014-04-16 MED ORDER — LABETALOL HCL 5 MG/ML IV SOLN
INTRAVENOUS | Status: AC
  Filled 2014-04-16: qty 4

## 2014-04-16 MED ORDER — ALBUTEROL SULFATE (2.5 MG/3ML) 0.083% IN NEBU
2.5000 mg | INHALATION_SOLUTION | Freq: Two times a day (BID) | RESPIRATORY_TRACT | Status: DC
  Administered 2014-04-16 – 2014-04-17 (×2): 2.5 mg via RESPIRATORY_TRACT
  Filled 2014-04-16 (×2): qty 3

## 2014-04-16 MED ORDER — GEMFIBROZIL 600 MG PO TABS
600.0000 mg | ORAL_TABLET | Freq: Two times a day (BID) | ORAL | Status: DC
  Administered 2014-04-16 – 2014-04-17 (×2): 600 mg via ORAL
  Filled 2014-04-16 (×5): qty 1

## 2014-04-16 MED ORDER — HEPARIN SODIUM (PORCINE) 1000 UNIT/ML IJ SOLN
INTRAMUSCULAR | Status: DC | PRN
  Administered 2014-04-16: 7000 [IU] via INTRAVENOUS

## 2014-04-16 MED ORDER — LACTATED RINGERS IV SOLN
INTRAVENOUS | Status: DC | PRN
  Administered 2014-04-16 (×3): via INTRAVENOUS

## 2014-04-16 MED ORDER — MIDAZOLAM HCL 5 MG/5ML IJ SOLN
INTRAMUSCULAR | Status: DC | PRN
  Administered 2014-04-16: 2 mg via INTRAVENOUS

## 2014-04-16 MED ORDER — AMOXICILLIN 875 MG PO TABS: 875.0000 mg | ORAL_TABLET | Freq: Two times a day (BID) | ORAL | Status: DC

## 2014-04-16 MED ORDER — MIDAZOLAM HCL 2 MG/2ML IJ SOLN
INTRAMUSCULAR | Status: AC
  Filled 2014-04-16: qty 2

## 2014-04-16 MED ORDER — FENTANYL CITRATE 0.05 MG/ML IJ SOLN
INTRAMUSCULAR | Status: AC
  Filled 2014-04-16: qty 5

## 2014-04-16 MED ORDER — DEXTROSE 5 % IV SOLN
1.5000 g | INTRAVENOUS | Status: DC
  Filled 2014-04-16: qty 1.5

## 2014-04-16 MED ORDER — SODIUM CHLORIDE 0.9 % IR SOLN
Status: DC | PRN
  Administered 2014-04-16: 500 mL

## 2014-04-16 MED ORDER — LABETALOL HCL 5 MG/ML IV SOLN: 10.0000 mg | INTRAVENOUS | Status: DC | PRN

## 2014-04-16 MED ORDER — RIVAROXABAN 20 MG PO TABS
20.0000 mg | ORAL_TABLET | Freq: Every day | ORAL | Status: DC
Start: 2014-04-18 — End: 2014-04-17

## 2014-04-16 MED ORDER — PANTOPRAZOLE SODIUM 40 MG PO TBEC
40.0000 mg | DELAYED_RELEASE_TABLET | Freq: Every day | ORAL | Status: DC
  Administered 2014-04-17: 40 mg via ORAL
  Filled 2014-04-16: qty 1

## 2014-04-16 MED ORDER — DICLOFENAC SODIUM 1 % TD GEL: 2.0000 g | Freq: Two times a day (BID) | TRANSDERMAL | Status: DC | PRN

## 2014-04-16 MED ORDER — SENNOSIDES-DOCUSATE SODIUM 8.6-50 MG PO TABS
1.0000 | ORAL_TABLET | Freq: Every evening | ORAL | Status: DC | PRN
  Filled 2014-04-16: qty 1

## 2014-04-16 MED ORDER — ALBUTEROL SULFATE HFA 108 (90 BASE) MCG/ACT IN AERS: 2.0000 | INHALATION_SPRAY | Freq: Two times a day (BID) | RESPIRATORY_TRACT | Status: DC

## 2014-04-16 MED ORDER — NEOSTIGMINE METHYLSULFATE 10 MG/10ML IV SOLN
INTRAVENOUS | Status: DC | PRN
  Administered 2014-04-16: 3 mg via INTRAVENOUS

## 2014-04-16 MED ORDER — ONDANSETRON HCL 4 MG/2ML IJ SOLN
INTRAMUSCULAR | Status: AC
  Filled 2014-04-16: qty 2

## 2014-04-16 MED ORDER — SODIUM CHLORIDE 0.9 % IV SOLN: INTRAVENOUS | Status: DC

## 2014-04-16 MED ORDER — FAMOTIDINE 20 MG PO TABS
20.0000 mg | ORAL_TABLET | Freq: Every day | ORAL | Status: DC
  Administered 2014-04-16 – 2014-04-17 (×2): 20 mg via ORAL
  Filled 2014-04-16 (×2): qty 1

## 2014-04-16 MED ORDER — ALBUTEROL SULFATE (2.5 MG/3ML) 0.083% IN NEBU: 2.5000 mg | INHALATION_SOLUTION | Freq: Two times a day (BID) | RESPIRATORY_TRACT | Status: DC

## 2014-04-16 MED ORDER — PROTAMINE SULFATE 10 MG/ML IV SOLN
INTRAVENOUS | Status: DC | PRN
  Administered 2014-04-16: 50 mg via INTRAVENOUS

## 2014-04-16 MED ORDER — SODIUM CHLORIDE 0.9 % IJ SOLN
INTRAMUSCULAR | Status: AC
  Filled 2014-04-16: qty 10

## 2014-04-16 MED ORDER — HYDROCORTISONE 1 % EX CREA
1.0000 "application " | TOPICAL_CREAM | Freq: Every day | CUTANEOUS | Status: DC | PRN
  Filled 2014-04-16: qty 28

## 2014-04-16 MED ORDER — MOMETASONE FURO-FORMOTEROL FUM 100-5 MCG/ACT IN AERO
2.0000 | INHALATION_SPRAY | Freq: Two times a day (BID) | RESPIRATORY_TRACT | Status: DC
  Administered 2014-04-16 – 2014-04-17 (×2): 2 via RESPIRATORY_TRACT
  Filled 2014-04-16: qty 8.8

## 2014-04-16 MED ORDER — ADULT MULTIVITAMIN W/MINERALS CH
1.0000 | ORAL_TABLET | Freq: Every day | ORAL | Status: DC
  Administered 2014-04-16 – 2014-04-17 (×2): 1 via ORAL
  Filled 2014-04-16 (×2): qty 1

## 2014-04-16 MED ORDER — ROCURONIUM BROMIDE 50 MG/5ML IV SOLN
INTRAVENOUS | Status: AC
  Filled 2014-04-16: qty 1

## 2014-04-16 MED ORDER — TERBINAFINE HCL 250 MG PO TABS
250.0000 mg | ORAL_TABLET | Freq: Every day | ORAL | Status: DC
  Administered 2014-04-16 – 2014-04-17 (×2): 250 mg via ORAL
  Filled 2014-04-16 (×2): qty 1

## 2014-04-16 MED ORDER — LABETALOL HCL 5 MG/ML IV SOLN
INTRAVENOUS | Status: DC | PRN
Start: 2014-04-16 — End: 2014-04-16
  Administered 2014-04-16 (×2): 5 mg via INTRAVENOUS

## 2014-04-16 MED ORDER — HYDRALAZINE HCL 20 MG/ML IJ SOLN: 5.0000 mg | INTRAMUSCULAR | Status: DC | PRN

## 2014-04-16 MED ORDER — FENTANYL CITRATE 0.05 MG/ML IJ SOLN
25.0000 ug | INTRAMUSCULAR | Status: DC | PRN
  Administered 2014-04-16 (×2): 50 ug via INTRAVENOUS

## 2014-04-16 MED ORDER — ALBUMIN HUMAN 5 % IV SOLN
INTRAVENOUS | Status: DC | PRN
  Administered 2014-04-16: 12:00:00 via INTRAVENOUS

## 2014-04-16 MED ORDER — LEVETIRACETAM 500 MG PO TABS
500.0000 mg | ORAL_TABLET | Freq: Two times a day (BID) | ORAL | Status: DC
  Administered 2014-04-16 – 2014-04-17 (×2): 500 mg via ORAL
  Filled 2014-04-16 (×3): qty 1

## 2014-04-16 MED ORDER — CHLORHEXIDINE GLUCONATE 4 % EX LIQD
60.0000 mL | Freq: Once | CUTANEOUS | Status: DC
  Filled 2014-04-16: qty 60

## 2014-04-16 MED ORDER — PROTAMINE SULFATE 10 MG/ML IV SOLN
INTRAVENOUS | Status: AC
  Filled 2014-04-16: qty 5

## 2014-04-16 MED ORDER — PROPOFOL 10 MG/ML IV BOLUS
INTRAVENOUS | Status: DC | PRN
  Administered 2014-04-16: 180 mg via INTRAVENOUS

## 2014-04-16 MED ORDER — SODIUM CHLORIDE 0.9 % IV SOLN: 500.0000 mL | Freq: Once | INTRAVENOUS | Status: AC | PRN

## 2014-04-16 MED ORDER — ROCURONIUM BROMIDE 100 MG/10ML IV SOLN
INTRAVENOUS | Status: DC | PRN
  Administered 2014-04-16: 50 mg via INTRAVENOUS

## 2014-04-16 MED ORDER — LIDOCAINE HCL (CARDIAC) 20 MG/ML IV SOLN
INTRAVENOUS | Status: AC
  Filled 2014-04-16: qty 5

## 2014-04-16 MED ORDER — DOCUSATE SODIUM 100 MG PO CAPS
100.0000 mg | ORAL_CAPSULE | Freq: Every day | ORAL | Status: DC
  Administered 2014-04-17: 100 mg via ORAL
  Filled 2014-04-16: qty 1

## 2014-04-16 MED ORDER — ENSURE ENLIVE PO LIQD
237.0000 mL | Freq: Two times a day (BID) | ORAL | Status: DC
  Administered 2014-04-17: 237 mL via ORAL

## 2014-04-16 MED ORDER — IPRATROPIUM-ALBUTEROL 20-100 MCG/ACT IN AERS: 1.0000 | INHALATION_SPRAY | Freq: Four times a day (QID) | RESPIRATORY_TRACT | Status: DC | PRN

## 2014-04-16 MED ORDER — NEOSTIGMINE METHYLSULFATE 10 MG/10ML IV SOLN
INTRAVENOUS | Status: AC
  Filled 2014-04-16: qty 1

## 2014-04-16 MED ORDER — ONDANSETRON HCL 4 MG PO TABS: 4.0000 mg | ORAL_TABLET | Freq: Three times a day (TID) | ORAL | Status: DC | PRN

## 2014-04-16 MED ORDER — ARTIFICIAL TEARS OP OINT
TOPICAL_OINTMENT | OPHTHALMIC | Status: DC | PRN
  Administered 2014-04-16: 1 via OPHTHALMIC

## 2014-04-16 MED ORDER — SODIUM CHLORIDE 0.9 % IV SOLN
INTRAVENOUS | Status: DC
  Administered 2014-04-16: 18:00:00 via INTRAVENOUS

## 2014-04-16 MED ORDER — ONDANSETRON HCL 4 MG/2ML IJ SOLN: 4.0000 mg | Freq: Four times a day (QID) | INTRAMUSCULAR | Status: DC | PRN

## 2014-04-16 MED ORDER — FENTANYL CITRATE 0.05 MG/ML IJ SOLN
INTRAMUSCULAR | Status: AC
  Filled 2014-04-16: qty 2

## 2014-04-16 MED ORDER — METOPROLOL TARTRATE 1 MG/ML IV SOLN
2.0000 mg | INTRAVENOUS | Status: DC | PRN
Start: 2014-04-16 — End: 2014-04-17

## 2014-04-16 MED ORDER — ARTIFICIAL TEARS OP OINT
TOPICAL_OINTMENT | OPHTHALMIC | Status: AC
  Filled 2014-04-16: qty 3.5

## 2014-04-16 MED ORDER — ONDANSETRON HCL 4 MG/2ML IJ SOLN: 4.0000 mg | Freq: Once | INTRAMUSCULAR | Status: AC | PRN

## 2014-04-16 MED ORDER — ALUM & MAG HYDROXIDE-SIMETH 200-200-20 MG/5ML PO SUSP: 15.0000 mL | ORAL | Status: DC | PRN

## 2014-04-16 MED ORDER — GLYCOPYRROLATE 0.2 MG/ML IJ SOLN
INTRAMUSCULAR | Status: AC
  Filled 2014-04-16: qty 3

## 2014-04-16 MED ORDER — GLYCOPYRROLATE 0.2 MG/ML IJ SOLN
INTRAMUSCULAR | Status: DC | PRN
  Administered 2014-04-16: 0.4 mg via INTRAVENOUS

## 2014-04-16 MED ORDER — EPHEDRINE SULFATE 50 MG/ML IJ SOLN
INTRAMUSCULAR | Status: AC
  Filled 2014-04-16: qty 1

## 2014-04-16 MED ORDER — DEXTROSE 5 % IV SOLN
1.5000 g | Freq: Two times a day (BID) | INTRAVENOUS | Status: AC
  Administered 2014-04-16 – 2014-04-17 (×2): 1.5 g via INTRAVENOUS
  Filled 2014-04-16 (×2): qty 1.5

## 2014-04-16 MED ORDER — PHENYLEPHRINE HCL 10 MG/ML IJ SOLN
INTRAMUSCULAR | Status: DC | PRN
Start: 2014-04-16 — End: 2014-04-16
  Administered 2014-04-16: 80 ug via INTRAVENOUS

## 2014-04-16 MED ORDER — MIDAZOLAM HCL 2 MG/2ML IJ SOLN: 2.0000 mg | INTRAMUSCULAR | Status: DC | PRN

## 2014-04-16 MED ORDER — TRYPSIN-CASTOR OIL-PERU BALSAM EX OINT
1.0000 | TOPICAL_OINTMENT | Freq: Three times a day (TID) | CUTANEOUS | Status: DC
Start: 2014-04-16 — End: 2014-04-16

## 2014-04-16 MED ORDER — 0.9 % SODIUM CHLORIDE (POUR BTL) OPTIME
TOPICAL | Status: DC | PRN
  Administered 2014-04-16: 2000 mL

## 2014-04-16 MED ORDER — FENTANYL CITRATE 0.05 MG/ML IJ SOLN
INTRAMUSCULAR | Status: DC | PRN
  Administered 2014-04-16: 150 ug via INTRAVENOUS
  Administered 2014-04-16 (×2): 50 ug via INTRAVENOUS
  Administered 2014-04-16: 100 ug via INTRAVENOUS
  Administered 2014-04-16: 50 ug via INTRAVENOUS
  Administered 2014-04-16: 100 ug via INTRAVENOUS
  Administered 2014-04-16 (×5): 50 ug via INTRAVENOUS

## 2014-04-16 MED ORDER — HYDROXYZINE HCL 25 MG PO TABS
25.0000 mg | ORAL_TABLET | Freq: Two times a day (BID) | ORAL | Status: DC | PRN
  Administered 2014-04-17: 25 mg via ORAL
  Filled 2014-04-16 (×2): qty 1

## 2014-04-16 MED ORDER — GUAIFENESIN-DM 100-10 MG/5ML PO SYRP: 15.0000 mL | ORAL_SOLUTION | ORAL | Status: DC | PRN

## 2014-04-16 MED ORDER — FENTANYL CITRATE 0.05 MG/ML IJ SOLN
50.0000 ug | Freq: Once | INTRAMUSCULAR | Status: AC
  Administered 2014-04-16: 50 ug via INTRAVENOUS

## 2014-04-16 MED ORDER — AMOXICILLIN 500 MG PO CAPS
500.0000 mg | ORAL_CAPSULE | Freq: Three times a day (TID) | ORAL | Status: DC
  Filled 2014-04-16 (×2): qty 1

## 2014-04-16 MED ORDER — GEMFIBROZIL 600 MG PO TABS
600.0000 mg | ORAL_TABLET | Freq: Two times a day (BID) | ORAL | Status: DC
  Filled 2014-04-16: qty 1

## 2014-04-16 MED ORDER — ONDANSETRON HCL 4 MG/2ML IJ SOLN
INTRAMUSCULAR | Status: DC | PRN
  Administered 2014-04-16: 4 mg via INTRAVENOUS

## 2014-04-16 MED ORDER — NICOTINE 14 MG/24HR TD PT24
14.0000 mg | MEDICATED_PATCH | Freq: Every day | TRANSDERMAL | Status: DC
  Administered 2014-04-16 – 2014-04-17 (×2): 14 mg via TRANSDERMAL
  Filled 2014-04-16 (×2): qty 1

## 2014-04-16 SURGICAL SUPPLY — 58 items
APL SKNCLS STERI-STRIP NONHPOA (GAUZE/BANDAGES/DRESSINGS) ×12
BANDAGE ESMARK 6X9 LF (GAUZE/BANDAGES/DRESSINGS) IMPLANT
BENZOIN TINCTURE PRP APPL 2/3 (GAUZE/BANDAGES/DRESSINGS) ×4 IMPLANT
BNDG CMPR 9X6 STRL LF SNTH (GAUZE/BANDAGES/DRESSINGS) ×3
BNDG ESMARK 6X9 LF (GAUZE/BANDAGES/DRESSINGS) ×4
CANISTER SUCTION 2500CC (MISCELLANEOUS) ×4 IMPLANT
CANNULA VESSEL 3MM 2 BLNT TIP (CANNULA) ×8 IMPLANT
CLIP LIGATING EXTRA MED SLVR (CLIP) ×4 IMPLANT
CLIP LIGATING EXTRA SM BLUE (MISCELLANEOUS) ×5 IMPLANT
CLSR STERI-STRIP ANTIMIC 1/2X4 (GAUZE/BANDAGES/DRESSINGS) ×4 IMPLANT
CUFF TOURNIQUET SINGLE 24IN (TOURNIQUET CUFF) ×1 IMPLANT
CUFF TOURNIQUET SINGLE 34IN LL (TOURNIQUET CUFF) IMPLANT
CUFF TOURNIQUET SINGLE 44IN (TOURNIQUET CUFF) IMPLANT
DRAIN SNY 10X20 3/4 PERF (WOUND CARE) IMPLANT
DRAPE X-RAY CASS 24X20 (DRAPES) IMPLANT
DRSG COVADERM 4X10 (GAUZE/BANDAGES/DRESSINGS) IMPLANT
DRSG COVADERM 4X14 (GAUZE/BANDAGES/DRESSINGS) ×2 IMPLANT
DRSG COVADERM 4X6 (GAUZE/BANDAGES/DRESSINGS) ×3 IMPLANT
DRSG COVADERM 4X8 (GAUZE/BANDAGES/DRESSINGS) IMPLANT
ELECT REM PT RETURN 9FT ADLT (ELECTROSURGICAL) ×4
ELECTRODE REM PT RTRN 9FT ADLT (ELECTROSURGICAL) ×3 IMPLANT
EVACUATOR SILICONE 100CC (DRAIN) IMPLANT
GLOVE BIOGEL M 6.5 STRL (GLOVE) ×2 IMPLANT
GLOVE BIOGEL PI IND STRL 6.5 (GLOVE) IMPLANT
GLOVE BIOGEL PI INDICATOR 6.5 (GLOVE) ×6
GLOVE ECLIPSE 6.5 STRL STRAW (GLOVE) ×4 IMPLANT
GLOVE SS BIOGEL STRL SZ 7.5 (GLOVE) ×3 IMPLANT
GLOVE SUPERSENSE BIOGEL SZ 7.5 (GLOVE) ×1
GOWN STRL REUS W/ TWL LRG LVL3 (GOWN DISPOSABLE) ×9 IMPLANT
GOWN STRL REUS W/TWL LRG LVL3 (GOWN DISPOSABLE) ×20
GOWN SURG REUSE MED LVL3 (GOWN DISPOSABLE) ×1 IMPLANT
INSERT FOGARTY SM (MISCELLANEOUS) IMPLANT
KIT BASIN OR (CUSTOM PROCEDURE TRAY) ×4 IMPLANT
KIT ROOM TURNOVER OR (KITS) ×4 IMPLANT
NS IRRIG 1000ML POUR BTL (IV SOLUTION) ×8 IMPLANT
PACK PERIPHERAL VASCULAR (CUSTOM PROCEDURE TRAY) ×4 IMPLANT
PAD ARMBOARD 7.5X6 YLW CONV (MISCELLANEOUS) ×8 IMPLANT
PADDING CAST COTTON 6X4 STRL (CAST SUPPLIES) ×1 IMPLANT
SET COLLECT BLD 21X3/4 12 (NEEDLE) IMPLANT
STAPLER VISISTAT 35W (STAPLE) IMPLANT
STOPCOCK 4 WAY LG BORE MALE ST (IV SETS) IMPLANT
STRIP CLOSURE SKIN 1/2X4 (GAUZE/BANDAGES/DRESSINGS) ×7 IMPLANT
SUT ETHILON 3 0 PS 1 (SUTURE) IMPLANT
SUT PROLENE 5 0 C 1 24 (SUTURE) ×6 IMPLANT
SUT PROLENE 6 0 CC (SUTURE) ×11 IMPLANT
SUT SILK 2 0 SH (SUTURE) ×4 IMPLANT
SUT SILK 3 0 (SUTURE) ×4
SUT SILK 3-0 18XBRD TIE 12 (SUTURE) IMPLANT
SUT SILK 4 0 (SUTURE) ×4
SUT SILK 4-0 18XBRD TIE 12 (SUTURE) IMPLANT
SUT VIC AB 2-0 CTX 36 (SUTURE) ×9 IMPLANT
SUT VIC AB 3-0 SH 27 (SUTURE) ×24
SUT VIC AB 3-0 SH 27X BRD (SUTURE) ×6 IMPLANT
SUT VICRYL 4-0 PS2 18IN ABS (SUTURE) ×2 IMPLANT
TRAY FOLEY CATH 16FRSI W/METER (SET/KITS/TRAYS/PACK) ×4 IMPLANT
TUBING EXTENTION W/L.L. (IV SETS) IMPLANT
UNDERPAD 30X30 INCONTINENT (UNDERPADS AND DIAPERS) ×4 IMPLANT
WATER STERILE IRR 1000ML POUR (IV SOLUTION) ×4 IMPLANT

## 2014-04-16 NOTE — Anesthesia Preprocedure Evaluation (Addendum)
Anesthesia Evaluation  Patient identified by MRN, date of birth, ID band Patient awake    Reviewed: Allergy & Precautions, NPO status , Patient's Chart, lab work & pertinent test results  Airway Mallampati: II  TM Distance: >3 FB Neck ROM: Full    Dental  (+) Edentulous Upper, Poor Dentition   Pulmonary Current Smoker,  breath sounds clear to auscultation        Cardiovascular hypertension, Rhythm:Regular Rate:Normal     Neuro/Psych    GI/Hepatic   Endo/Other  diabetes  Renal/GU      Musculoskeletal   Abdominal   Peds  Hematology   Anesthesia Other Findings   Reproductive/Obstetrics                            Anesthesia Physical Anesthesia Plan  ASA: III  Anesthesia Plan: General   Post-op Pain Management:    Induction: Intravenous  Airway Management Planned: Oral ETT  Additional Equipment:   Intra-op Plan:   Post-operative Plan: Extubation in OR  Informed Consent: I have reviewed the patients History and Physical, chart, labs and discussed the procedure including the risks, benefits and alternatives for the proposed anesthesia with the patient or authorized representative who has indicated his/her understanding and acceptance.     Plan Discussed with: CRNA and Anesthesiologist  Anesthesia Plan Comments:         Anesthesia Quick Evaluation

## 2014-04-16 NOTE — Transfer of Care (Signed)
Immediate Anesthesia Transfer of Care Note  Patient: [name]  Procedure(s) Performed: Procedure(s): RIGHT FEMORAL-ANTERIOR TIBIAL ARTERY BYPASS GRAFT USING  NON REVERSED LEFT GREATER SAPHENOUS  VEIN (Right) ENDARTERECTOMY, RIGHT COMMON FEMORAL ARTERY AND PROFUNDA (Right) LEFT GREATER SAPPHENOUS VEIN HARVEST (Left)  Patient Location: PACU  Anesthesia Type:General  Level of Consciousness: awake, alert , oriented and patient cooperative  Airway & Oxygen Therapy: Patient Spontanous Breathing and Patient connected to nasal cannula oxygen  Post-op Assessment: Report given to RN, Post -op Vital signs reviewed and stable and Patient moving all extremities  Post vital signs: Reviewed and stable  Last Vitals:  Filed Vitals:   04/16/14 1301  BP:   Pulse:   Temp: 36.4 C  Resp:     Complications: No apparent anesthesia complications

## 2014-04-16 NOTE — Interval H&P Note (Signed)
History and Physical Interval Note:  04/16/2014 6:48 AM  [name]  has presented today for surgery, with the diagnosis of Peripheral Vascular Disease with rest pain right leg  I70.221  The various methods of treatment have been discussed with the patient and family. After consideration of risks, benefits and other options for treatment, the patient has consented to  Procedure(s): [place] (Right) as a surgical intervention .  The patient's history has been reviewed, patient examined, no change in status, stable for surgery.  I have reviewed the patient's chart and labs.  Questions were answered to the patient's satisfaction.     Letitia Sabala

## 2014-04-16 NOTE — Progress Notes (Signed)
  Day of Surgery Note    Subjective:  No complaints; sleepy and awakes to voice  Filed Vitals:   04/16/14 1445  BP:   Pulse: 74  Temp:   Resp: 9    Incisions:   All bandages are clean and dry and intact Extremities:  Biphasic right AT doppler signal; 3+ palpable graft pulse Cardiac:  regular Lungs:  Non labored  Assessment/Plan:  This is a 54 y.o. male who is s/p Right femoral to anterior tibial bypass with translocated non-reversed great saphenous vein from left leg harvest, right common and profundus femoris endarterectomy  -pt doing well this afternoon with biphasic left AT doppler signal and palpable graft pulse. -transfer to [place] when bed available.   [name], PA-C 04/16/2014 3:04 PM

## 2014-04-16 NOTE — Progress Notes (Signed)
2 scab like areas noted to left knee area.

## 2014-04-16 NOTE — Anesthesia Procedure Notes (Signed)
Procedure Name: Intubation Date/Time: 04/16/2014 7:44 AM Performed by: Willeen Cass P Pre-anesthesia Checklist: Patient identified, Timeout performed, Emergency Drugs available, Suction available and Patient being monitored Patient Re-evaluated:Patient Re-evaluated prior to inductionOxygen Delivery Method: Circle system utilized Preoxygenation: Pre-oxygenation with 100% oxygen Intubation Type: IV induction Ventilation: Two handed mask ventilation required and Oral airway inserted - appropriate to patient size Laryngoscope Size: Mac and 4 Grade View: Grade II Tube type: Oral Tube size: 7.5 mm Number of attempts: 1 Airway Equipment and Method: Stylet Placement Confirmation: ETT inserted through vocal cords under direct vision,  breath sounds checked- equal and bilateral and positive ETCO2 Secured at: 23 cm Tube secured with: Tape Dental Injury: Teeth and Oropharynx as per pre-operative assessment
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
## 2014-04-16 NOTE — Op Note (Signed)
OPERATIVE REPORT  DATE OF SURGERY: 04/16/2014  PATIENT: [name], 54 y.o. male MRN: [id number]  DOB: 1960-12-25  PRE-OPERATIVE DIAGNOSIS: Critical limb ischemia right leg  POST-OPERATIVE DIAGNOSIS:  Same  PROCEDURE: Right femoral to anterior tibial bypass with translocated non-reversed great saphenous vein from left leg harvest, right common and profundus femoris endarterectomy  SURGEON:  Curt Jews, M.D.  PHYSICIAN ASSISTANT: Samantha Rhyne PA-C  ANESTHESIA:  Gen.  EBL: 100 ml  Total I/O In: 2250 [I.V.:2000; IV Piggyback:250] Out: 435 [Urine:335; Blood:100]  BLOOD ADMINISTERED: None  DRAINS: None  SPECIMEN: None  COUNTS CORRECT:  YES  PLAN OF CARE: PACU stable   PATIENT DISPOSITION:  PACU - hemodynamically stable  PROCEDURE DETAILS: Patient was taken operative placed supine position where the area of the left leg was imaged with SonoSite ultrasound. Patient patent great saphenous vein and this was marked on the skin from the level the groin to the above ankle position. Great saphenous vein was harvested from the left leg the groin to the distal calf. Multiple skin bridges were used. The vein was of good caliber down to the proximal calf where it split into 2 different. Both of these were left intact and harvested. The larger of these 2 was chosen for bypass. Tributary branches were ligated with 3 or 4 silk ties and divided. The vein was ligated distally and divided the vein was occluded with a Cooley clamp at the saphenofemoral junction and was excised from the saphenofemoral junction. The saphenofemoral junction was oversewn with 2 layers of 5-0 Prolene suture.  Next incision was made over the right femoral pulse with a prior scar. Dissection was continued down to the common femoral artery. The patient had a prior bypass in the hood of this bypass was exposed. The superficial femoral and profundus femoris arteries were isolated and encircled with Vesseloops. Next a  separate incision was made in the proximal to mid calf over the lateral aspect over the anterior tibial artery. This was continued between the level of the muscle bodies. The artery was exposed and was a 5 small but minimal atherosclerotic change. A tunnel was created on the lateral aspect of the leg from the anterior tibial artery up to the groin. The saphenous vein was gently dilated was found to be adequate for bypass. The vein was marked to prevent twisting. The patient was given 7000 units intravenous heparin and after adequate circulation time the common superficial femoral and profundus femoris arteries were occluded. The old nonfunctional have the vein graft was removed from the common femoral artery. There was extensive posterior plaque. This extended down into the deep femoral artery takeoff. Endarterectomy was begun on the common femoral artery and extended down with high fevers and endarterectomy of the profunda. There was excellent backbleeding. The plaque was transected in the superficial femoral artery and the plaque was tacked at the origin of the superficial femoral artery. The vein was brought onto the field and was spatulated. The proximal end of the vein was sewn to the junction of the common superficial femoral artery with a running 6-0 Prolene suture. Anastomosis was tested and found to be adequate. Next using a Mills valvulotome the vein valves were lysed giving excellent flow through the vein graft. The vein was brought to the prior created tunnel down to the level of the anterior tibial artery. A pneumatic tourniquet was placed in the above-knee position on the right. The leg was elevated and exsanguinated with an Esmarch tourniquet. The anterior  tibial artery was opened with 11 blade some ulcerative with Potts scissors. This accepted a 2 mm dilator. The vein was cut to appropriate length and was spatulated and sewn end-to-side to the artery with a running 6-0 Prolene suture. Prior to  completion of the anastomosis to dilator again passed proximally and distally without difficulty. The acidosis was completed and the tourniquet was deflated. The patient had graft dependent Doppler flow at the level of the ankle and anterior tibial artery. Patient was given 50 mg of protamine to reverse heparin. Wounds irrigated with saline. Hemostasis tablet cautery. The wounds were closed with 2-0 Vicryl the fascia in the groin and anterior tibial artery exposure. The vein harvest were closed with 3-0 subcuticular continuous and 4 subcuticular Vicryl closure. Sterile dressing was applied the patient was transferred to the recovery room stable condition   Curt Jews, M.D. 04/16/2014 12:37 PM

## 2014-04-16 NOTE — Anesthesia Postprocedure Evaluation (Signed)
  Anesthesia Post-op Note  Patient: [name]  Procedure(s) Performed: Procedure(s): RIGHT FEMORAL-ANTERIOR TIBIAL ARTERY BYPASS GRAFT USING  NON REVERSED LEFT GREATER SAPHENOUS  VEIN (Right) ENDARTERECTOMY, RIGHT COMMON FEMORAL ARTERY AND PROFUNDA (Right) LEFT GREATER SAPPHENOUS VEIN HARVEST (Left)  Patient Location: PACU  Anesthesia Type:General  Level of Consciousness: awake, alert  and oriented  Airway and Oxygen Therapy: Patient Spontanous Breathing and Patient connected to nasal cannula oxygen  Post-op Pain: mild  Post-op Assessment: Post-op Vital signs reviewed, Patient's Cardiovascular Status Stable, Respiratory Function Stable, Patent Airway and Pain level controlled  Post-op Vital Signs: stable  Last Vitals:  Filed Vitals:   04/16/14 1930  BP:   Pulse:   Temp: 36.6 C  Resp:     Complications: No apparent anesthesia complications

## 2014-04-16 NOTE — Progress Notes (Signed)
C/o pain in feet. Dr Linna Caprice called and informed with new orders noted.

## 2014-04-17 ENCOUNTER — Encounter (HOSPITAL_COMMUNITY): Payer: Self-pay | Admitting: Vascular Surgery

## 2014-04-17 ENCOUNTER — Other Ambulatory Visit: Payer: Self-pay | Admitting: Internal Medicine

## 2014-04-17 LAB — GLUCOSE, CAPILLARY
GLUCOSE-CAPILLARY: 131 mg/dL — AB (ref 70–99)
Glucose-Capillary: 148 mg/dL — ABNORMAL HIGH (ref 70–99)

## 2014-04-17 LAB — CBC
HEMATOCRIT: 34.1 % — AB (ref 39.0–52.0)
Hemoglobin: 11.2 g/dL — ABNORMAL LOW (ref 13.0–17.0)
MCH: 29.9 pg (ref 26.0–34.0)
MCHC: 32.8 g/dL (ref 30.0–36.0)
MCV: 91.2 fL (ref 78.0–100.0)
PLATELETS: 179 10*3/uL (ref 150–400)
RBC: 3.74 MIL/uL — ABNORMAL LOW (ref 4.22–5.81)
RDW: 13.3 % (ref 11.5–15.5)
WBC: 7 10*3/uL (ref 4.0–10.5)

## 2014-04-17 LAB — BASIC METABOLIC PANEL
ANION GAP: 4 — AB (ref 5–15)
BUN: 10 mg/dL (ref 6–23)
CALCIUM: 8.6 mg/dL (ref 8.4–10.5)
CHLORIDE: 105 mmol/L (ref 96–112)
CO2: 27 mmol/L (ref 19–32)
CREATININE: 0.97 mg/dL (ref 0.50–1.35)
GFR calc Af Amer: >90 mL/min (ref >90)
GFR calc non Af Amer: >90 mL/min (ref >90)
GLUCOSE: 112 mg/dL — AB (ref 70–99)
Potassium: 3.9 mmol/L (ref 3.5–5.1)
Sodium: 136 mmol/L (ref 135–145)

## 2014-04-17 MED ORDER — OXYCODONE-ACETAMINOPHEN 5-325 MG PO TABS: 1.0000 | ORAL_TABLET | Freq: Four times a day (QID) | ORAL | Status: DC | PRN

## 2014-04-17 NOTE — Evaluation (Signed)
Physical Therapy Evaluation Patient Details Name: [name] MRN: [id number] DOB: [birth year] Today's Date: 04/17/2014   History of Present Illness  pt presents with R Femroal to anterior tibial bypass graft, R femoral endartecomy.    Clinical Impression  Pt moving well and only needing S for safety.  Pt does indicates pain in LE is better now than prior to surgery and he is very anxious for D/C to home or outside to smoke.  RN aware.  Will continue to follow if remains on acute.      Follow Up Recommendations Home health PT;Supervision - Intermittent    Equipment Recommendations  None recommended by PT    Recommendations for Other Services       Precautions / Restrictions Precautions Precautions: Fall Restrictions Weight Bearing Restrictions: No      Mobility  Bed Mobility               General bed mobility comments: pt in recliner  Transfers Overall transfer level: Needs assistance Equipment used: Rolling walker (2 wheeled) Transfers: Sit to/from Stand Sit to Stand: Supervision         General transfer comment: cues for safety only.    Ambulation/Gait Ambulation/Gait assistance: Supervision Ambulation Distance (Feet): 250 Feet Assistive device: Rolling walker (2 wheeled) Gait Pattern/deviations: Step-through pattern;Decreased stride length;Trunk flexed     General Gait Details: pt remains flexed on RW, but indicates this is baseline for hime.  pt moving well, but does run into door frame and wall when distracted conversing with PT.    Stairs            Wheelchair Mobility    Modified Rankin (Stroke Patients Only)       Balance Overall balance assessment: No apparent balance deficits (not formally assessed)                                           Pertinent Vitals/Pain Pain Assessment: 0-10 Pain Score: 3  Pain Location: Incisions Pain Descriptors / Indicators: Burning Pain Intervention(s): Monitored during  session;Premedicated before session;Repositioned    Home Living Family/patient expects to be discharged to:: Private residence Living Arrangements: Other relatives (Brother) Available Help at Discharge: Family;Personal care attendant;Available PRN/intermittently (Aide 7 days per week for 2hrs) Type of Home: House Home Access: Ramped entrance     Home Layout: One level Home Equipment: Walker - 2 wheels;Cane - single point;Bedside commode;Tub bench;Wheelchair - manual      Prior Function Level of Independence: Needs assistance   Gait / Transfers Assistance Needed: pt indicates aide helps him get OOB.  He uses a RW for ambulation and W/C for long distance.    ADL's / Homemaking Assistance Needed: Aide A with bathing and dressing lower body and back.  Brother and aide perform all homemaking tasks.          Hand Dominance        Extremity/Trunk Assessment   Upper Extremity Assessment: Defer to OT evaluation           Lower Extremity Assessment: RLE deficits/detail;LLE deficits/detail RLE Deficits / Details: ROM and Strength limited by incisional pain, but pt indicates limited ROM at baseline from previous surgeries.   LLE Deficits / Details: ROM and strength functional, but limited from previous surgeries.    Cervical / Trunk Assessment: Kyphotic  Communication   Communication: No difficulties  Cognition Arousal/Alertness: Awake/alert Behavior  During Therapy: Restless (pt getting irritable about wanting to smoke.  ) Overall Cognitive Status: Within Functional Limits for tasks assessed                      General Comments      Exercises        Assessment/Plan    PT Assessment Patient needs continued PT services  PT Diagnosis Difficulty walking   PT Problem List Decreased strength;Decreased range of motion;Decreased activity tolerance;Decreased balance;Decreased mobility;Decreased knowledge of use of DME  PT Treatment Interventions DME instruction;Gait  training;Functional mobility training;Therapeutic activities;Therapeutic exercise;Balance training;Patient/family education   PT Goals (Current goals can be found in the Care Plan section) Acute Rehab PT Goals Patient Stated Goal: Outside to smoke PT Goal Formulation: With patient Time For Goal Achievement: 04/24/14 Potential to Achieve Goals: Good    Frequency Min 3X/week   Barriers to discharge        Co-evaluation               End of Session   Activity Tolerance: No increased pain Patient left: in chair;with call bell/phone within reach Nurse Communication: Mobility status         Time: 5053-9767 PT Time Calculation (min) (ACUTE ONLY): 14 min   Charges:   PT Evaluation $Initial PT Evaluation Tier I: 1 Procedure     PT G CodesCatarina Hartshorn, [place] 04/17/2014, 11:43 AM

## 2014-04-17 NOTE — Progress Notes (Addendum)
  [facility] Progress Note  04/17/2014 7:22 AM 1 Day Post-Op  Subjective: Pain in right foot improved. Some "burning" to left leg.   Tmax 98.7 BP sys 90s-140s 02 95% RA  Filed Vitals:   04/17/14 0500  BP: 119/69  Pulse: 98  Temp:   Resp: 18    Physical Exam: Incisions:  Right groin and below knee dressings clean. Left saphenectomy dressings clean.  Extremities:  Biphasic right AT, peroneal and posterior tibial signals. 3+ right graft pulse.   CBC    Component Value Date/Time   WBC 7.0 04/17/2014 0250   RBC 3.74* 04/17/2014 0250   HGB 11.2* 04/17/2014 0250   HCT 34.1* 04/17/2014 0250   PLT 179 04/17/2014 0250   MCV 91.2 04/17/2014 0250   MCH 29.9 04/17/2014 0250   MCHC 32.8 04/17/2014 0250   RDW 13.3 04/17/2014 0250   LYMPHSABS 2.8 03/26/2014 0700   MONOABS 0.5 03/26/2014 0700   EOSABS 0.5 03/26/2014 0700   BASOSABS 0.0 03/26/2014 0700    BMET    Component Value Date/Time   NA 136 04/17/2014 0250   K 3.9 04/17/2014 0250   CL 105 04/17/2014 0250   CO2 27 04/17/2014 0250   GLUCOSE 112* 04/17/2014 0250   BUN 10 04/17/2014 0250   CREATININE 0.97 04/17/2014 0250   CREATININE 0.67 02/01/2014 1518   CALCIUM 8.6 04/17/2014 0250   CALCIUM 9.1 10/28/2009 2118   GFRNONAA >90 04/17/2014 0250   GFRNONAA >89 02/01/2014 1518   GFRAA >90 04/17/2014 0250   GFRAA >89 02/01/2014 1518    INR    Component Value Date/Time   INR 1.06 04/16/2014 0603   INR 1.6 02/01/2014 1517     Intake/Output Summary (Last 24 hours) at 04/17/14 0722 Last data filed at 04/17/14 0454  Gross per 24 hour  Intake 3637.5 ml  Output   1085 ml  Net 2552.5 ml     Assessment:  54 y.o. male is s/p: Right femoral to anterior tibial bypass with translocated non-reversed great saphenous vein from left leg harvest, right common and profundus femoris endarterectomy  1 Day Post-Op  Plan: -Doing well post-operatively. -Easily palpable right graft pulse and biphasic right  AT, peroneal and posterior tibial doppler signals. -Ambulate. -If ambulating well, will discharge home today at patient's request. Otherwise, patient is agreeable to transfer to 2[facility] and discharge tomorrow.    Virgina Jock, PA-C [facility] Office: [phone] Pager: [phone] 04/17/2014 7:22 AM     I have examined the patient, reviewed and agree with above. Looks great. Once to go home. Easily palpable graft pulse and lateral thigh and calf. Denies any pain in his right foot. Reports rest pain is completely resolved. Will transfer to [facility] [place]. Also will discharge tomorrow  Xianna Siverling, MD 04/17/2014 9:15 AM

## 2014-04-18 ENCOUNTER — Encounter: Payer: Self-pay | Admitting: Dietician

## 2014-04-18 ENCOUNTER — Encounter: Payer: Self-pay | Admitting: Internal Medicine

## 2014-04-18 ENCOUNTER — Other Ambulatory Visit: Payer: Self-pay | Admitting: Internal Medicine

## 2014-04-18 ENCOUNTER — Telehealth: Payer: Self-pay | Admitting: Vascular Surgery

## 2014-04-18 MED ORDER — CLONAZEPAM 0.5 MG PO TABS: 0.5000 mg | ORAL_TABLET | Freq: Every day | ORAL | Status: DC

## 2014-04-18 NOTE — Telephone Encounter (Signed)
-----   Message from Mena Goes, RN sent at 04/17/2014 11:46 AM EDT ----- Regarding: Schedule   ----- Message -----    From: Alvia Grove, PA-C    Sent: 04/17/2014  11:44 AM      To: [facility] Charge Pool  Right femoral to anterior tibial bypass with translocated non-reversed great saphenous vein from left leg harvest, right common and profundus femoris endarterectomy 04/16/14  F/u with Dr Donnetta Hutching in 2 weeks  Thanks Maudie Mercury

## 2014-04-18 NOTE — Progress Notes (Signed)
Patient ID: TYLIQUE AULL, male   DOB: 06-28-1960, 54 y.o.   MRN: [id number]   Mr Roehrig has requested klonopin refill. Please see last [facility] note for previous history of klonopin taper. I ran [place] controlled database and he has no new fills of benzodiazepene since he filled Rx given in [place] for klonopin 0.5 mg bid #60. I will print out klonopin 0.5 mg daily #30, no refills and instruct pharmacy not to fill until 30 days from prior. Benzodiazepene prescriptions should come from [facility] and/or PCP only. Next Rx can be klonopin 0.5 mg qod #15, no refills in one month.  Lottie Mussel, MD Internal Medicine, PGY-1 Pager [phone] 04/18/2014, 2:20 PM

## 2014-04-18 NOTE — Telephone Encounter (Signed)
Called to pharm 
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
## 2014-04-18 NOTE — Telephone Encounter (Signed)
Spoke with patient to schedule appointment. He stated that he has 30 pills for pain, and that it will not last him long enough and wanted to talk to the nurse. I transferred him to [facility]. dpm

## 2014-04-19 IMAGING — CR DG HIP (WITH OR WITHOUT PELVIS) 2-3V*L*
3 series · 3 of 3 positions shown · non-contrast
Comparison: CT 11/22/2011

CLINICAL DATA: Left hip pain.

LEFT HIP - COMPLETE 2+ VIEW

[t pelvis a.p.]
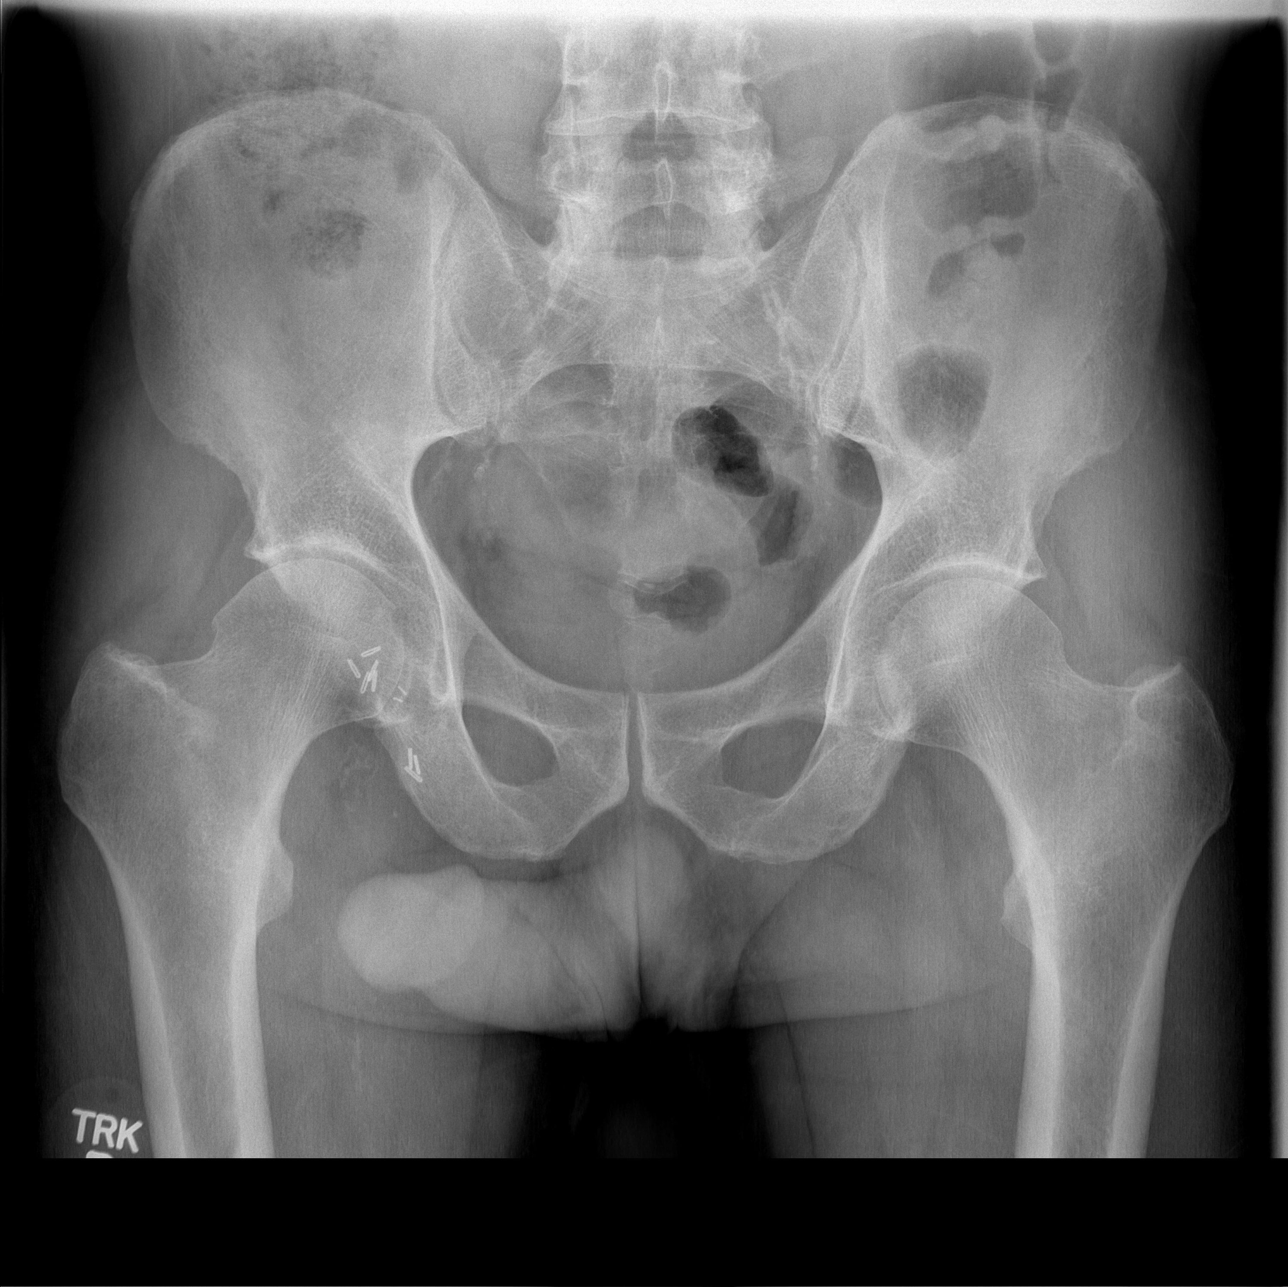

[t hip ap left]
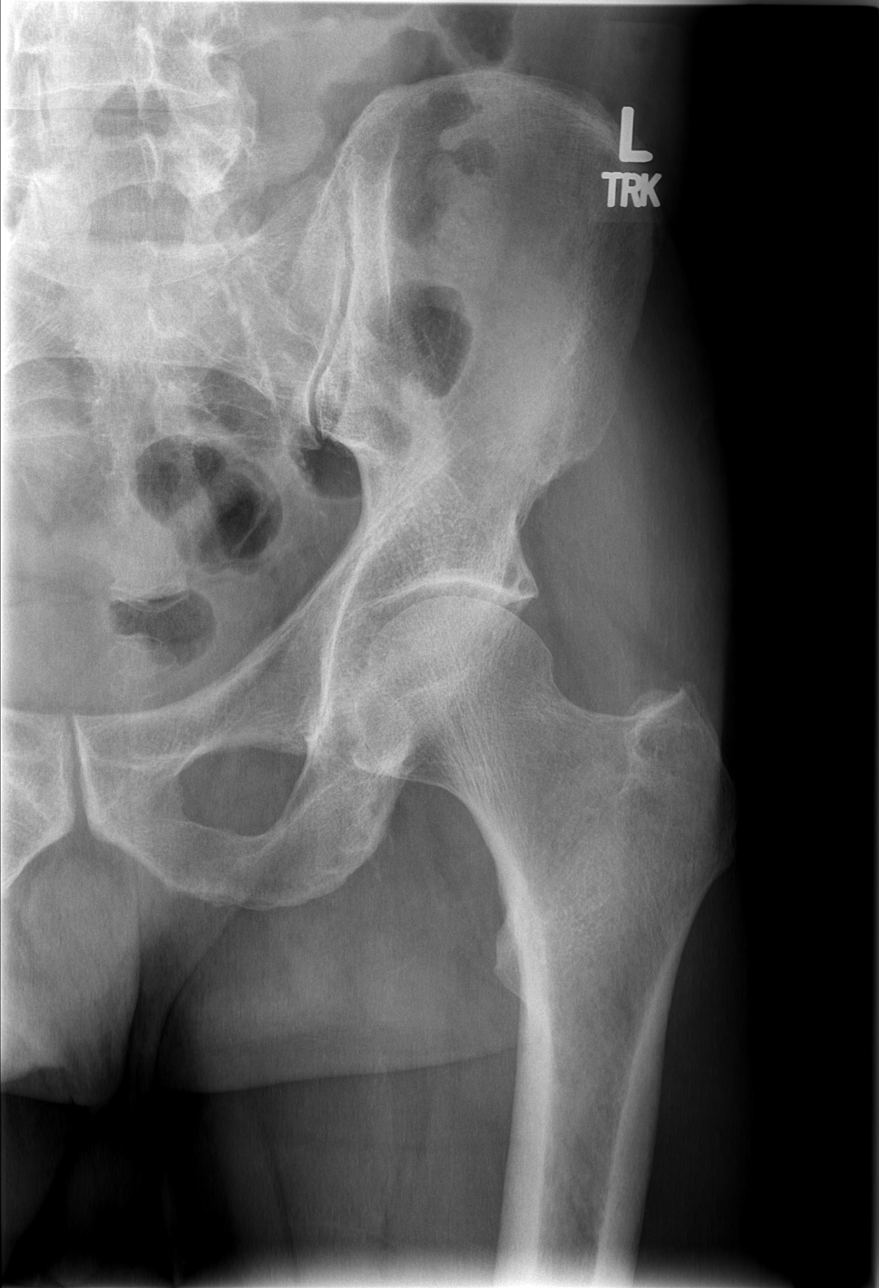

[t hip frog leg left]
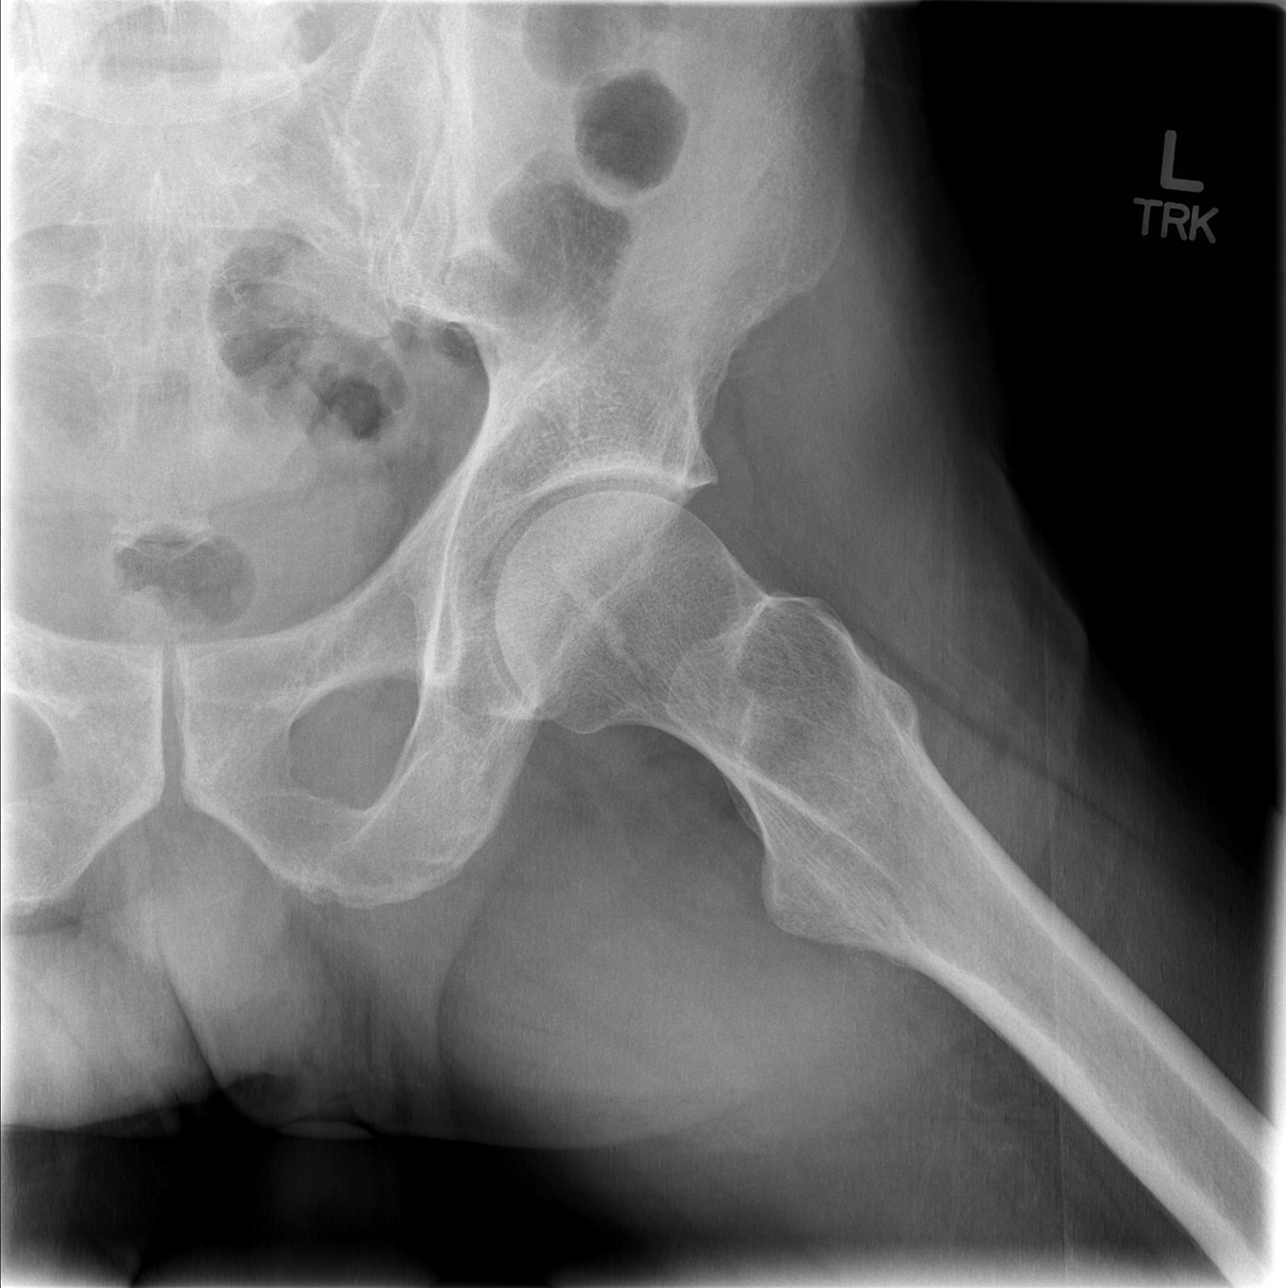

[3 of 3 positions shown; findings below may reference images not displayed]

FINDINGS: AP view of the pelvis and two views of the left hip were
obtained.  Pelvic ring is intact.  Again noted are surgical clips
in the right groin.  No evidence for acute fracture or dislocation.
No significant degenerative changes.
IMPRESSION: No acute bony abnormality to the pelvis or left hip.

## 2014-04-20 ENCOUNTER — Ambulatory Visit (HOSPITAL_COMMUNITY)
Admission: RE | Admit: 2014-04-20 | Discharge: 2014-04-20 | Disposition: A | Discharge status: Home / Self Care | Payer: Medicaid Other | Source: Ambulatory Visit | Attending: Vascular Surgery | Admitting: Vascular Surgery

## 2014-04-20 ENCOUNTER — Ambulatory Visit (INDEPENDENT_AMBULATORY_CARE_PROVIDER_SITE_OTHER): Payer: Self-pay | Admitting: Family

## 2014-04-20 ENCOUNTER — Telehealth: Payer: Self-pay

## 2014-04-20 ENCOUNTER — Encounter: Payer: Self-pay | Admitting: Family

## 2014-04-20 VITALS — BP 115/78 | HR 123 | Temp 97.0°F | Resp 18 | Ht 73.0 in | Wt 139.0 lb

## 2014-04-20 DIAGNOSIS — Z720 Tobacco use: Secondary | ICD-10-CM

## 2014-04-20 DIAGNOSIS — I739 Peripheral vascular disease, unspecified: Secondary | ICD-10-CM | POA: Diagnosis not present

## 2014-04-20 DIAGNOSIS — R208 Other disturbances of skin sensation: Secondary | ICD-10-CM | POA: Diagnosis not present

## 2014-04-20 DIAGNOSIS — G8918 Other acute postprocedural pain: Secondary | ICD-10-CM | POA: Diagnosis present

## 2014-04-20 DIAGNOSIS — Z9889 Other specified postprocedural states: Secondary | ICD-10-CM | POA: Insufficient documentation

## 2014-04-20 DIAGNOSIS — F17200 Nicotine dependence, unspecified, uncomplicated: Secondary | ICD-10-CM

## 2014-04-20 DIAGNOSIS — R200 Anesthesia of skin: Secondary | ICD-10-CM

## 2014-04-20 DIAGNOSIS — Z48812 Encounter for surgical aftercare following surgery on the circulatory system: Secondary | ICD-10-CM

## 2014-04-20 DIAGNOSIS — Z95828 Presence of other vascular implants and grafts: Secondary | ICD-10-CM

## 2014-04-20 DIAGNOSIS — M79604 Pain in right leg: Secondary | ICD-10-CM | POA: Diagnosis not present

## 2014-04-20 MED ORDER — OXYCODONE-ACETAMINOPHEN 5-325 MG PO TABS: 1.0000 | ORAL_TABLET | Freq: Four times a day (QID) | ORAL | Status: DC | PRN

## 2014-04-20 NOTE — Telephone Encounter (Signed)
Phone call from pt. 4/7 re: question of restarting his Xarelto following his surgery, and of getting his pain medication refilled.  Spoke with Dr. Donnetta Hutching today.  Recommended to have pt. resume Xarelto; declined refill of any narcotic pain medication at this time.  Recommended that pt. Should come to office for appt. before more pain medication will be given.  Returned call to pt. And gave recommendation to resume Xarelto.  Also, advised that Dr. Donnetta Hutching would not fill his narcotic pain medication at this time.  The pt. stated "I've got to have something for pain, and I might have to go to the hospital, because my right foot is hurting real bad."  Reported that he had increased pain in the right foot last night, and the color turned "white."   Reported his foot is very painful today. Stated when he lowers his leg down, the color of the foot turns red, and that he has a lot of heel pain with standing.  Denies pain in the leg; stated "it just hurts in my foot."    Reported pt's symptoms to Dr. Bridgett Larsson, in office.  Gave order to bring pt. in to scan his right leg graft today, to check patency.  Appt. given to pt. for 2:00 PM today.  Agrees with plan.

## 2014-04-20 NOTE — Patient Instructions (Signed)
Peripheral Vascular Disease Peripheral Vascular Disease (PVD), also called Peripheral Arterial Disease (PAD), is a circulation problem caused by cholesterol (atherosclerotic plaque) deposits in the arteries. PVD commonly occurs in the lower extremities (legs) but it can occur in other areas of the body, such as your arms. The cholesterol buildup in the arteries reduces blood flow which can cause pain and other serious problems. The presence of PVD can place a person at risk for Coronary Artery Disease (CAD).  CAUSES  Causes of PVD can be many. It is usually associated with more than one risk factor such as:   High Cholesterol.  Smoking.  Diabetes.  Lack of exercise or inactivity.  High blood pressure (hypertension).  Obesity.  Family history. SYMPTOMS   When the lower extremities are affected, patients with PVD may experience:  Leg pain with exertion or physical activity. This is called INTERMITTENT CLAUDICATION. This may present as cramping or numbness with physical activity. The location of the pain is associated with the level of blockage. For example, blockage at the abdominal level (distal abdominal aorta) may result in buttock or hip pain. Lower leg arterial blockage may result in calf pain.  As PVD becomes more severe, pain can develop with less physical activity.  In people with severe PVD, leg pain may occur at rest.  Other PVD signs and symptoms:  Leg numbness or weakness.  Coldness in the affected leg or foot, especially when compared to the other leg.  A change in leg color.  Patients with significant PVD are more prone to ulcers or sores on toes, feet or legs. These may take longer to heal or may reoccur. The ulcers or sores can become infected.  If signs and symptoms of PVD are ignored, gangrene may occur. This can result in the loss of toes or loss of an entire limb.  Not all leg pain is related to PVD. Other medical conditions can cause leg pain such  as:  Blood clots (embolism) or Deep Vein Thrombosis.  Inflammation of the blood vessels (vasculitis).  Spinal stenosis. DIAGNOSIS  Diagnosis of PVD can involve several different types of tests. These can include:  Pulse Volume Recording Method (PVR). This test is simple, painless and does not involve the use of X-rays. PVR involves measuring and comparing the blood pressure in the arms and legs. An ABI (Ankle-Brachial Index) is calculated. The normal ratio of blood pressures is 1. As this number becomes smaller, it indicates more severe disease.  < 0.95 - indicates significant narrowing in one or more leg vessels.  <0.8 - there will usually be pain in the foot, leg or buttock with exercise.  <0.4 - will usually have pain in the legs at rest.  <0.25 - usually indicates limb threatening PVD.  Doppler detection of pulses in the legs. This test is painless and checks to see if you have a pulses in your legs/feet.  A dye or contrast material (a substance that highlights the blood vessels so they show up on x-ray) may be given to help your caregiver better see the arteries for the following tests. The dye is eliminated from your body by the kidney's. Your caregiver may order blood work to check your kidney function and other laboratory values before the following tests are performed:  Magnetic Resonance Angiography (MRA). An MRA is a picture study of the blood vessels and arteries. The MRA machine uses a large magnet to produce images of the blood vessels.  Computed Tomography Angiography (CTA). A CTA   is a specialized x-ray that looks at how the blood flows in your blood vessels. An IV may be inserted into your arm so contrast dye can be injected.  Angiogram. Is a procedure that uses x-rays to look at your blood vessels. This procedure is minimally invasive, meaning a small incision (cut) is made in your groin. A small tube (catheter) is then inserted into the artery of your groin. The catheter  is guided to the blood vessel or artery your caregiver wants to examine. Contrast dye is injected into the catheter. X-rays are then taken of the blood vessel or artery. After the images are obtained, the catheter is taken out. TREATMENT  Treatment of PVD involves many interventions which may include:  Lifestyle changes:  Quitting smoking.  Exercise.  Following a low fat, low cholesterol diet.  Control of diabetes.  Foot care is very important to the PVD patient. Good foot care can help prevent infection.  Medication:  Cholesterol-lowering medicine.  Blood pressure medicine.  Anti-platelet drugs.  Certain medicines may reduce symptoms of Intermittent Claudication.  Interventional/Surgical options:  Angioplasty. An Angioplasty is a procedure that inflates a balloon in the blocked artery. This opens the blocked artery to improve blood flow.  Stent Implant. A wire mesh tube (stent) is placed in the artery. The stent expands and stays in place, allowing the artery to remain open.  Peripheral Bypass Surgery. This is a surgical procedure that reroutes the blood around a blocked artery to help improve blood flow. This type of procedure may be performed if Angioplasty or stent implants are not an option. SEEK IMMEDIATE MEDICAL CARE IF:   You develop pain or numbness in your arms or legs.  Your arm or leg turns cold, becomes blue in color.  You develop redness, warmth, swelling and pain in your arms or legs. MAKE SURE YOU:   Understand these instructions.  Will watch your condition.  Will get help right away if you are not doing well or get worse. Document Released: 02/06/2004 Document Revised: 03/23/2011 Document Reviewed: 01/03/2008 Exit[employer] Patient Information 2015 [employer]. This information is not intended to replace advice given to you by your health care provider. Make sure you discuss any questions you have with your health care provider.    Smoking  Cessation Quitting smoking is important to your health and has many advantages. However, it is not always easy to quit since nicotine is a very addictive drug. Oftentimes, people try 3 times or more before being able to quit. This document explains the best ways for you to prepare to quit smoking. Quitting takes hard work and a lot of effort, but you can do it. ADVANTAGES OF QUITTING SMOKING  You will live longer, feel better, and live better.  Your body will feel the impact of quitting smoking almost immediately.  Within 20 minutes, blood pressure decreases. Your pulse returns to its normal level.  After 8 hours, carbon monoxide levels in the blood return to normal. Your oxygen level increases.  After 24 hours, the chance of having a heart attack starts to decrease. Your breath, hair, and body stop smelling like smoke.  After 48 hours, damaged nerve endings begin to recover. Your sense of taste and smell improve.  After 72 hours, the body is virtually free of nicotine. Your bronchial tubes relax and breathing becomes easier.  After 2 to 12 weeks, lungs can hold more air. Exercise becomes easier and circulation improves.  The risk of having a heart attack, stroke,   cancer, or lung disease is greatly reduced.  After 1 year, the risk of coronary heart disease is cut in half.  After 5 years, the risk of stroke falls to the same as a nonsmoker.  After 10 years, the risk of lung cancer is cut in half and the risk of other cancers decreases significantly.  After 15 years, the risk of coronary heart disease drops, usually to the level of a nonsmoker.  If you are pregnant, quitting smoking will improve your chances of having a healthy baby.  The people you live with, especially any children, will be healthier.  You will have extra money to spend on things other than cigarettes. QUESTIONS TO THINK ABOUT BEFORE ATTEMPTING TO QUIT You may want to talk about your answers with your health care  provider.  Why do you want to quit?  If you tried to quit in the past, what helped and what did not?  What will be the most difficult situations for you after you quit? How will you plan to handle them?  Who can help you through the tough times? Your family? Friends? A health care provider?  What pleasures do you get from smoking? What ways can you still get pleasure if you quit? Here are some questions to ask your health care provider:  How can you help me to be successful at quitting?  What medicine do you think would be best for me and how should I take it?  What should I do if I need more help?  What is smoking withdrawal like? How can I get information on withdrawal? GET READY  Set a quit date.  Change your environment by getting rid of all cigarettes, ashtrays, matches, and lighters in your home, car, or work. Do not let people smoke in your home.  Review your past attempts to quit. Think about what worked and what did not. GET SUPPORT AND ENCOURAGEMENT You have a better chance of being successful if you have help. You can get support in many ways.  Tell your family, friends, and coworkers that you are going to quit and need their support. Ask them not to smoke around you.  Get individual, group, or telephone counseling and support. Programs are available at local hospitals and health centers. Call your local health department for information about programs in your area.  Spiritual beliefs and practices may help some smokers quit.  Download a "quit meter" on your computer to keep track of quit statistics, such as how long you have gone without smoking, cigarettes not smoked, and money saved.  Get a self-help book about quitting smoking and staying off tobacco. LEARN NEW SKILLS AND BEHAVIORS  Distract yourself from urges to smoke. Talk to someone, go for a walk, or occupy your time with a task.  Change your normal routine. Take a different route to work. Drink tea  instead of coffee. Eat breakfast in a different place.  Reduce your stress. Take a hot bath, exercise, or read a book.  Plan something enjoyable to do every day. Reward yourself for not smoking.  Explore interactive web-based programs that specialize in helping you quit. GET MEDICINE AND USE IT CORRECTLY Medicines can help you stop smoking and decrease the urge to smoke. Combining medicine with the above behavioral methods and support can greatly increase your chances of successfully quitting smoking.  Nicotine replacement therapy helps deliver nicotine to your body without the negative effects and risks of smoking. Nicotine replacement therapy includes nicotine gum, lozenges,   inhalers, nasal sprays, and skin patches. Some may be available over-the-counter and others require a prescription.  Antidepressant medicine helps people abstain from smoking, but how this works is unknown. This medicine is available by prescription.  Nicotinic receptor partial agonist medicine simulates the effect of nicotine in your brain. This medicine is available by prescription. Ask your health care provider for advice about which medicines to use and how to use them based on your health history. Your health care provider will tell you what side effects to look out for if you choose to be on a medicine or therapy. Carefully read the information on the package. Do not use any other product containing nicotine while using a nicotine replacement product.  RELAPSE OR DIFFICULT SITUATIONS Most relapses occur within the first 3 months after quitting. Do not be discouraged if you start smoking again. Remember, most people try several times before finally quitting. You may have symptoms of withdrawal because your body is used to nicotine. You may crave cigarettes, be irritable, feel very hungry, cough often, get headaches, or have difficulty concentrating. The withdrawal symptoms are only temporary. They are strongest when you  first quit, but they will go away within 10-14 days. To reduce the chances of relapse, try to:  Avoid drinking alcohol. Drinking lowers your chances of successfully quitting.  Reduce the amount of caffeine you consume. Once you quit smoking, the amount of caffeine in your body increases and can give you symptoms, such as a rapid heartbeat, sweating, and anxiety.  Avoid smokers because they can make you want to smoke.  Do not let weight gain distract you. Many smokers will gain weight when they quit, usually less than 10 pounds. Eat a healthy diet and stay active. You can always lose the weight gained after you quit.  Find ways to improve your mood other than smoking. FOR MORE INFORMATION  www.smokefree.gov  Document Released: 12/23/2000 Document Revised: 05/15/2013 Document Reviewed: 04/09/2011 [employer] Patient Information 2015 [employer]. This information is not intended to replace advice given to you by your health care provider. Make sure you discuss any questions you have with your health care provider.    Smoking Cessation, Tips for Success If you are ready to quit smoking, congratulations! You have chosen to help yourself be healthier. Cigarettes bring nicotine, tar, carbon monoxide, and other irritants into your body. Your lungs, heart, and blood vessels will be able to work better without these poisons. There are many different ways to quit smoking. Nicotine gum, nicotine patches, a nicotine inhaler, or nicotine nasal spray can help with physical craving. Hypnosis, support groups, and medicines help break the habit of smoking. WHAT THINGS CAN I DO TO MAKE QUITTING EASIER?  Here are some tips to help you quit for good:  Pick a date when you will quit smoking completely. Tell all of your friends and family about your plan to quit on that date.  Do not try to slowly cut down on the number of cigarettes you are smoking. Pick a quit date and quit smoking completely starting on that  day.  Throw away all cigarettes.   Clean and remove all ashtrays from your home, work, and car.  On a card, write down your reasons for quitting. Carry the card with you and read it when you get the urge to smoke.  Cleanse your body of nicotine. Drink enough water and fluids to keep your urine clear or pale yellow. Do this after quitting to flush the nicotine from   your body.  Learn to predict your moods. Do not let a bad situation be your excuse to have a cigarette. Some situations in your life might tempt you into wanting a cigarette.  Never have "just one" cigarette. It leads to wanting another and another. Remind yourself of your decision to quit.  Change habits associated with smoking. If you smoked while driving or when feeling stressed, try other activities to replace smoking. Stand up when drinking your coffee. Brush your teeth after eating. Sit in a different chair when you read the paper. Avoid alcohol while trying to quit, and try to drink fewer caffeinated beverages. Alcohol and caffeine may urge you to smoke.  Avoid foods and drinks that can trigger a desire to smoke, such as sugary or spicy foods and alcohol.  Ask people who smoke not to smoke around you.  Have something planned to do right after eating or having a cup of coffee. For example, plan to take a walk or exercise.  Try a relaxation exercise to calm you down and decrease your stress. Remember, you may be tense and nervous for the first 2 weeks after you quit, but this will pass.  Find new activities to keep your hands busy. Play with a pen, coin, or rubber band. Doodle or draw things on paper.  Brush your teeth right after eating. This will help cut down on the craving for the taste of tobacco after meals. You can also try mouthwash.   Use oral substitutes in place of cigarettes. Try using lemon drops, carrots, cinnamon sticks, or chewing gum. Keep them handy so they are available when you have the urge to  smoke.  When you have the urge to smoke, try deep breathing.  Designate your home as a nonsmoking area.  If you are a heavy smoker, ask your health care provider about a prescription for nicotine chewing gum. It can ease your withdrawal from nicotine.  Reward yourself. Set aside the cigarette money you save and buy yourself something nice.  Look for support from others. Join a support group or smoking cessation program. Ask someone at home or at work to help you with your plan to quit smoking.  Always ask yourself, "Do I need this cigarette or is this just a reflex?" Tell yourself, "Today, I choose not to smoke," or "I do not want to smoke." You are reminding yourself of your decision to quit.  Do not replace cigarette smoking with electronic cigarettes (commonly called e-cigarettes). The safety of e-cigarettes is unknown, and some may contain harmful chemicals.  If you relapse, do not give up! Plan ahead and think about what you will do the next time you get the urge to smoke. HOW WILL I FEEL WHEN I QUIT SMOKING? You may have symptoms of withdrawal because your body is used to nicotine (the addictive substance in cigarettes). You may crave cigarettes, be irritable, feel very hungry, cough often, get headaches, or have difficulty concentrating. The withdrawal symptoms are only temporary. They are strongest when you first quit but will go away within 10-14 days. When withdrawal symptoms occur, stay in control. Think about your reasons for quitting. Remind yourself that these are signs that your body is healing and getting used to being without cigarettes. Remember that withdrawal symptoms are easier to treat than the major diseases that smoking can cause.  Even after the withdrawal is over, expect periodic urges to smoke. However, these cravings are generally short lived and will go away whether you   smoke or not. Do not smoke! WHAT RESOURCES ARE AVAILABLE TO HELP ME QUIT SMOKING? Your health care  provider can direct you to community resources or hospitals for support, which may include:  Group support.  Education.  Hypnosis.  Therapy. Document Released: 09/27/2003 Document Revised: 05/15/2013 Document Reviewed: 06/16/2012 ExitCare Patient Information 2015 [employer]. This information is not intended to replace advice given to you by your health care provider. Make sure you discuss any questions you have with your health care provider.  
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
## 2014-04-30 ENCOUNTER — Telehealth: Payer: Self-pay

## 2014-04-30 ENCOUNTER — Encounter: Payer: Self-pay | Admitting: Vascular Surgery

## 2014-04-30 NOTE — Telephone Encounter (Signed)
Patient calling to see if he should continue antibiotics.  He is going to run out of medications soon.  Call made to patient repeating information from last visit. He will need a scan prior to determinifn if medications should be discontinued.   Note in chart  from [facility] regarding needed MRI and possible pain seeking behaviors.    Laverle Patter, RN

## 2014-05-01 ENCOUNTER — Ambulatory Visit (INDEPENDENT_AMBULATORY_CARE_PROVIDER_SITE_OTHER): Payer: Self-pay | Admitting: Family

## 2014-05-01 ENCOUNTER — Encounter: Payer: Self-pay | Admitting: Family

## 2014-05-01 ENCOUNTER — Encounter: Payer: Medicaid Other | Admitting: Vascular Surgery

## 2014-05-01 VITALS — BP 105/67 | HR 88 | Temp 97.0°F | Resp 18 | Ht 73.0 in | Wt 143.0 lb

## 2014-05-01 DIAGNOSIS — G8918 Other acute postprocedural pain: Secondary | ICD-10-CM

## 2014-05-01 DIAGNOSIS — Z720 Tobacco use: Secondary | ICD-10-CM

## 2014-05-01 DIAGNOSIS — Z95828 Presence of other vascular implants and grafts: Secondary | ICD-10-CM

## 2014-05-01 DIAGNOSIS — Z9889 Other specified postprocedural states: Secondary | ICD-10-CM

## 2014-05-01 DIAGNOSIS — M79604 Pain in right leg: Secondary | ICD-10-CM

## 2014-05-01 DIAGNOSIS — I739 Peripheral vascular disease, unspecified: Secondary | ICD-10-CM

## 2014-05-01 DIAGNOSIS — F17200 Nicotine dependence, unspecified, uncomplicated: Secondary | ICD-10-CM

## 2014-05-01 MED ORDER — OXYCODONE-ACETAMINOPHEN 10-325 MG PO TABS: 1.0000 | ORAL_TABLET | Freq: Four times a day (QID) | ORAL | Status: DC | PRN
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
## 2014-05-01 NOTE — Telephone Encounter (Signed)
Patient called back to find out if he has to continue to take the mediation. [place] and needs to know before he goes. Advised the patient she wants to see him 05/10/14 at which time she will do labs and will discuss antibiotics. He advised he is moving before that date. Advised him then there is no way to know for sure if he needs to continue the medication and urged him to keep the appointment.

## 2014-05-09 ENCOUNTER — Encounter: Payer: Self-pay | Admitting: *Deleted

## 2014-05-10 ENCOUNTER — Emergency Department (HOSPITAL_COMMUNITY)
Admission: EM | Admit: 2014-05-10 | Discharge: 2014-05-10 | Disposition: A | Discharge status: Home / Self Care | Payer: Medicaid Other | Attending: Emergency Medicine | Admitting: Emergency Medicine

## 2014-05-10 ENCOUNTER — Encounter: Payer: Self-pay | Admitting: Internal Medicine

## 2014-05-10 ENCOUNTER — Emergency Department (HOSPITAL_COMMUNITY): Payer: Medicaid Other

## 2014-05-10 ENCOUNTER — Encounter (HOSPITAL_COMMUNITY): Payer: Self-pay | Admitting: Emergency Medicine

## 2014-05-10 ENCOUNTER — Ambulatory Visit
Admission: RE | Admit: 2014-05-10 | Discharge: 2014-05-10 | Disposition: A | Discharge status: Home / Self Care | Payer: Medicaid Other | Source: Ambulatory Visit | Attending: Internal Medicine | Admitting: Internal Medicine

## 2014-05-10 ENCOUNTER — Ambulatory Visit (INDEPENDENT_AMBULATORY_CARE_PROVIDER_SITE_OTHER): Payer: Medicaid Other | Admitting: Internal Medicine

## 2014-05-10 VITALS — Temp 98.2°F | Wt 148.0 lb

## 2014-05-10 DIAGNOSIS — I10 Essential (primary) hypertension: Secondary | ICD-10-CM | POA: Diagnosis not present

## 2014-05-10 DIAGNOSIS — Z8611 Personal history of tuberculosis: Secondary | ICD-10-CM | POA: Diagnosis not present

## 2014-05-10 DIAGNOSIS — R200 Anesthesia of skin: Secondary | ICD-10-CM | POA: Insufficient documentation

## 2014-05-10 DIAGNOSIS — B182 Chronic viral hepatitis C: Secondary | ICD-10-CM | POA: Diagnosis not present

## 2014-05-10 DIAGNOSIS — Z95810 Presence of automatic (implantable) cardiac defibrillator: Secondary | ICD-10-CM | POA: Diagnosis not present

## 2014-05-10 DIAGNOSIS — Z8701 Personal history of pneumonia (recurrent): Secondary | ICD-10-CM | POA: Insufficient documentation

## 2014-05-10 DIAGNOSIS — G060 Intracranial abscess and granuloma: Secondary | ICD-10-CM

## 2014-05-10 DIAGNOSIS — T1490 Injury, unspecified: Secondary | ICD-10-CM | POA: Diagnosis not present

## 2014-05-10 DIAGNOSIS — I509 Heart failure, unspecified: Secondary | ICD-10-CM | POA: Insufficient documentation

## 2014-05-10 DIAGNOSIS — E785 Hyperlipidemia, unspecified: Secondary | ICD-10-CM | POA: Diagnosis not present

## 2014-05-10 DIAGNOSIS — Z79899 Other long term (current) drug therapy: Secondary | ICD-10-CM | POA: Insufficient documentation

## 2014-05-10 DIAGNOSIS — F329 Major depressive disorder, single episode, unspecified: Secondary | ICD-10-CM | POA: Insufficient documentation

## 2014-05-10 DIAGNOSIS — Z792 Long term (current) use of antibiotics: Secondary | ICD-10-CM | POA: Diagnosis not present

## 2014-05-10 DIAGNOSIS — Y9389 Activity, other specified: Secondary | ICD-10-CM | POA: Diagnosis not present

## 2014-05-10 DIAGNOSIS — W010XXA Fall on same level from slipping, tripping and stumbling without subsequent striking against object, initial encounter: Secondary | ICD-10-CM | POA: Insufficient documentation

## 2014-05-10 DIAGNOSIS — M069 Rheumatoid arthritis, unspecified: Secondary | ICD-10-CM | POA: Diagnosis not present

## 2014-05-10 DIAGNOSIS — Z8619 Personal history of other infectious and parasitic diseases: Secondary | ICD-10-CM | POA: Diagnosis not present

## 2014-05-10 DIAGNOSIS — Z87442 Personal history of urinary calculi: Secondary | ICD-10-CM | POA: Diagnosis not present

## 2014-05-10 DIAGNOSIS — G894 Chronic pain syndrome: Secondary | ICD-10-CM | POA: Insufficient documentation

## 2014-05-10 DIAGNOSIS — Y9289 Other specified places as the place of occurrence of the external cause: Secondary | ICD-10-CM | POA: Insufficient documentation

## 2014-05-10 DIAGNOSIS — T1490XA Injury, unspecified, initial encounter: Secondary | ICD-10-CM

## 2014-05-10 DIAGNOSIS — F410 Panic disorder [episodic paroxysmal anxiety] without agoraphobia: Secondary | ICD-10-CM | POA: Diagnosis not present

## 2014-05-10 DIAGNOSIS — E119 Type 2 diabetes mellitus without complications: Secondary | ICD-10-CM | POA: Diagnosis not present

## 2014-05-10 DIAGNOSIS — S62111A Displaced fracture of triquetrum [cuneiform] bone, right wrist, initial encounter for closed fracture: Secondary | ICD-10-CM | POA: Diagnosis not present

## 2014-05-10 DIAGNOSIS — Y998 Other external cause status: Secondary | ICD-10-CM | POA: Insufficient documentation

## 2014-05-10 DIAGNOSIS — Z87438 Personal history of other diseases of male genital organs: Secondary | ICD-10-CM | POA: Diagnosis not present

## 2014-05-10 DIAGNOSIS — Z7951 Long term (current) use of inhaled steroids: Secondary | ICD-10-CM | POA: Insufficient documentation

## 2014-05-10 DIAGNOSIS — Z8614 Personal history of Methicillin resistant Staphylococcus aureus infection: Secondary | ICD-10-CM | POA: Diagnosis not present

## 2014-05-10 DIAGNOSIS — Z720 Tobacco use: Secondary | ICD-10-CM | POA: Diagnosis not present

## 2014-05-10 DIAGNOSIS — K219 Gastro-esophageal reflux disease without esophagitis: Secondary | ICD-10-CM | POA: Insufficient documentation

## 2014-05-10 DIAGNOSIS — J449 Chronic obstructive pulmonary disease, unspecified: Secondary | ICD-10-CM | POA: Diagnosis not present

## 2014-05-10 DIAGNOSIS — Z86711 Personal history of pulmonary embolism: Secondary | ICD-10-CM | POA: Insufficient documentation

## 2014-05-10 DIAGNOSIS — Z8673 Personal history of transient ischemic attack (TIA), and cerebral infarction without residual deficits: Secondary | ICD-10-CM | POA: Diagnosis not present

## 2014-05-10 DIAGNOSIS — S6991XA Unspecified injury of right wrist, hand and finger(s), initial encounter: Secondary | ICD-10-CM | POA: Diagnosis present

## 2014-05-10 LAB — BASIC METABOLIC PANEL
BUN: 14 mg/dL (ref 6–23)
CALCIUM: 9.2 mg/dL (ref 8.4–10.5)
CHLORIDE: 105 meq/L (ref 96–112)
CO2: 27 meq/L (ref 19–32)
CREATININE: 0.85 mg/dL (ref 0.50–1.35)
Glucose, Bld: 126 mg/dL — ABNORMAL HIGH (ref 70–99)
Potassium: 3.8 mEq/L (ref 3.5–5.3)
SODIUM: 140 meq/L (ref 135–145)

## 2014-05-10 LAB — CBC WITH DIFFERENTIAL/PLATELET
Basophils Absolute: 0.1 10*3/uL (ref 0.0–0.1)
Basophils Relative: 1 % (ref 0–1)
EOS ABS: 0.4 10*3/uL (ref 0.0–0.7)
Eosinophils Relative: 6 % — ABNORMAL HIGH (ref 0–5)
HEMATOCRIT: 40.9 % (ref 39.0–52.0)
Hemoglobin: 13.6 g/dL (ref 13.0–17.0)
LYMPHS ABS: 2.2 10*3/uL (ref 0.7–4.0)
Lymphocytes Relative: 33 % (ref 12–46)
MCH: 29.8 pg (ref 26.0–34.0)
MCHC: 33.3 g/dL (ref 30.0–36.0)
MCV: 89.7 fL (ref 78.0–100.0)
MONOS PCT: 9 % (ref 3–12)
MPV: 10.4 fL (ref 8.6–12.4)
Monocytes Absolute: 0.6 10*3/uL (ref 0.1–1.0)
NEUTROS PCT: 51 % (ref 43–77)
Neutro Abs: 3.4 10*3/uL (ref 1.7–7.7)
Platelets: 267 10*3/uL (ref 150–400)
RBC: 4.56 MIL/uL (ref 4.22–5.81)
RDW: 14.3 % (ref 11.5–15.5)
WBC: 6.6 10*3/uL (ref 4.0–10.5)

## 2014-05-10 LAB — C-REACTIVE PROTEIN: CRP: 0.7 mg/dL — ABNORMAL HIGH (ref <0.60)

## 2014-05-10 MED ORDER — OXYCODONE-ACETAMINOPHEN 5-325 MG PO TABS: 1.0000 | ORAL_TABLET | Freq: Four times a day (QID) | ORAL | Status: DC | PRN

## 2014-05-10 MED ORDER — OXYCODONE-ACETAMINOPHEN 5-325 MG PO TABS
2.0000 | ORAL_TABLET | Freq: Once | ORAL | Status: AC
  Administered 2014-05-10: 2 via ORAL
  Filled 2014-05-10: qty 2

## 2014-05-10 NOTE — ED Notes (Signed)
Pt returned to room  
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
## 2014-05-10 NOTE — Discharge Instructions (Signed)
Take pain medications as prescribed.  Do not drive or operate heavy machinery for 4-6 hours after taking pain medication.

## 2014-05-10 NOTE — ED Notes (Signed)
Pt reports that he fell onto right hand yesterday. Went to an Urgent Care and had xray which showed a fracture. Sent here for further tx.

## 2014-05-10 NOTE — ED Notes (Signed)
Declined W/C at D/C and was escorted to lobby by RN. 

## 2014-05-10 NOTE — ED Notes (Signed)
Pt c/o right hand fracture after having xray today; pt sts fell yesterday; swelling noted

## 2014-05-10 NOTE — ED Notes (Signed)
Pt walked out of room "I'm going out to smoke".

## 2014-05-11 ENCOUNTER — Telehealth: Payer: Self-pay | Admitting: *Deleted

## 2014-05-11 LAB — HEPATITIS C RNA QUANTITATIVE: HCV Quantitative: NOT DETECTED IU/mL (ref <15)

## 2014-05-11 LAB — SEDIMENTATION RATE: Sed Rate: 12 mm/hr (ref 0–20)

## 2014-05-11 NOTE — Telephone Encounter (Signed)
Pt called stating he has been getting medicine from [facility] for ED.  He can't afford to go to [facility] anymore and wants [place] to write the Rx for these injections. I called pharmacy to get the name of this med.  Trimix (alprostadil 1 mg , papaverine 30 mg , phentolamine 10 mcg) per ml in prefilled syringes This can be mixed at [facility] [phone] Pt # [phone]  Not sure if we offer this.

## 2014-05-13 NOTE — Telephone Encounter (Signed)
As we have not been prescribing these medicines and he is asking for several at once, we will ask him to come to our clinic to speak with a provider who will decide which if any are safe given his chronic co-morbidities. He was seen the end of March and told to follow-up in one month but does not appear to have an [facility] appointment scheduled. We will not prescribe any of these for now but ask him to come to [facility] for his one month follow-up as instructed at last visit.

## 2014-05-14 LAB — HEPATITIS C GENOTYPE

## 2014-05-14 NOTE — Telephone Encounter (Signed)
Pt called to inform but no answer, message left to call clinic

## 2014-05-14 NOTE — Addendum Note (Signed)
Addended by: Hulan Fray on: 05/14/2014 04:14 PM   Modules accepted: Orders

## 2014-05-15 ENCOUNTER — Encounter: Payer: Self-pay | Admitting: Internal Medicine

## 2014-05-15 ENCOUNTER — Other Ambulatory Visit: Payer: Self-pay | Admitting: Internal Medicine

## 2014-05-15 MED ORDER — PANTOPRAZOLE SODIUM 40 MG PO TBEC: 40.0000 mg | DELAYED_RELEASE_TABLET | Freq: Every day | ORAL | Status: DC

## 2014-05-15 NOTE — Progress Notes (Signed)
Mr Panetta requested clonazepam refill. As I have discussed with Mr Lindon several times, we are weaning him off of his benzodiazapine. He was last prescribed clonazepam 0.5 mg daily #30, 0 refills on 04/18/14 by [facility] with plan for continued weaning, with the intention that the next prescription would be clonazepam 0.5 mg qod #15, 0 refill. I ran [place] today when I received his request and he received clonazepam 0.5 mg bid #60 04/29/14 which was originally written on 03/31/14. This was written by Dr Cathie Olden in [place] and filled at [facility] on [address] in [place], [place]. At last [facility] visit on 04/12/14, I ran [employer] search and saw he received this same prescription on 03/31/14 from Dr Lorne Skeens, filled 04/01/14 and written 03/31/14. He denied that this ever occurred and I told him this would count towards his taper. See note from 04/12/14 for further details.    Today, I called the pharmacy at [facility] [address] and was told the Rx was filled but not yet picked up. They confirmed it was written by Dr Lorne Skeens. I called Dr Staci Righter office and was told that Mr Dispenza is a patient there and they would fax a copy of the note from 03/31/14 but I have not yet received it. Dr Lorne Skeens was not available to speak. I will not fill Mr Brookover's current refill request as he has a prescription from another provider waiting for him at his pharmacy. He is scheduled to see Dr Genene Churn 05/17/14 and this can be explained at that time.  Lottie Mussel, MD Internal Medicine, PGY-1 Pager [phone] 05/15/2014, 3:32 PM

## 2014-05-15 NOTE — Telephone Encounter (Signed)
Pt informed and he will make clinic appointment.

## 2014-05-16 ENCOUNTER — Telehealth: Payer: Self-pay | Admitting: Internal Medicine

## 2014-05-16 NOTE — Telephone Encounter (Signed)
Call to patient to confirm appointment for 05/17/14 at 9:15 lmtcb ° °

## 2014-05-17 ENCOUNTER — Ambulatory Visit: Payer: Self-pay | Admitting: Internal Medicine

## 2014-05-18 NOTE — Telephone Encounter (Signed)
Pt placed call to CSW this afternoon inquiring about information received from [employer].  CSW returned call to [name] and explained reasoning [employer] application was placed.  Pt states he has been using [facility] [phone] for medical appointments and his brother has been available for other transportation needs.  CSW reassured [name] is available as needed.

## 2014-05-21 ENCOUNTER — Telehealth: Payer: Self-pay | Admitting: *Deleted

## 2014-05-21 ENCOUNTER — Telehealth: Payer: Self-pay

## 2014-05-21 ENCOUNTER — Ambulatory Visit: Payer: Self-pay | Admitting: Surgery

## 2014-05-21 ENCOUNTER — Encounter (HOSPITAL_COMMUNITY): Payer: Self-pay

## 2014-05-21 ENCOUNTER — Encounter (HOSPITAL_COMMUNITY): Payer: Self-pay | Admitting: Emergency Medicine

## 2014-05-21 ENCOUNTER — Encounter: Payer: Self-pay | Admitting: Vascular Surgery

## 2014-05-21 ENCOUNTER — Emergency Department (HOSPITAL_COMMUNITY)
Admission: EM | Admit: 2014-05-21 | Discharge: 2014-05-21 | Discharge status: Against Medical Advice | Payer: Medicaid Other | Attending: Emergency Medicine | Admitting: Emergency Medicine

## 2014-05-21 DIAGNOSIS — M79644 Pain in right finger(s): Secondary | ICD-10-CM | POA: Insufficient documentation

## 2014-05-21 DIAGNOSIS — I509 Heart failure, unspecified: Secondary | ICD-10-CM | POA: Insufficient documentation

## 2014-05-21 DIAGNOSIS — J449 Chronic obstructive pulmonary disease, unspecified: Secondary | ICD-10-CM | POA: Diagnosis not present

## 2014-05-21 DIAGNOSIS — G894 Chronic pain syndrome: Secondary | ICD-10-CM | POA: Insufficient documentation

## 2014-05-21 DIAGNOSIS — Z720 Tobacco use: Secondary | ICD-10-CM | POA: Diagnosis not present

## 2014-05-21 DIAGNOSIS — M79671 Pain in right foot: Secondary | ICD-10-CM | POA: Diagnosis not present

## 2014-05-21 DIAGNOSIS — E119 Type 2 diabetes mellitus without complications: Secondary | ICD-10-CM | POA: Insufficient documentation

## 2014-05-21 DIAGNOSIS — I10 Essential (primary) hypertension: Secondary | ICD-10-CM | POA: Diagnosis not present

## 2014-05-21 DIAGNOSIS — R569 Unspecified convulsions: Secondary | ICD-10-CM | POA: Diagnosis present

## 2014-05-21 IMAGING — CR DG CHEST 2V
2 series · 2 of 2 positions shown · non-contrast
Comparison: 08/07/2011, 11/22/2011

CLINICAL DATA: Dizziness, fever, cough, smoker

CHEST - 2 VIEW

[w chest pa]
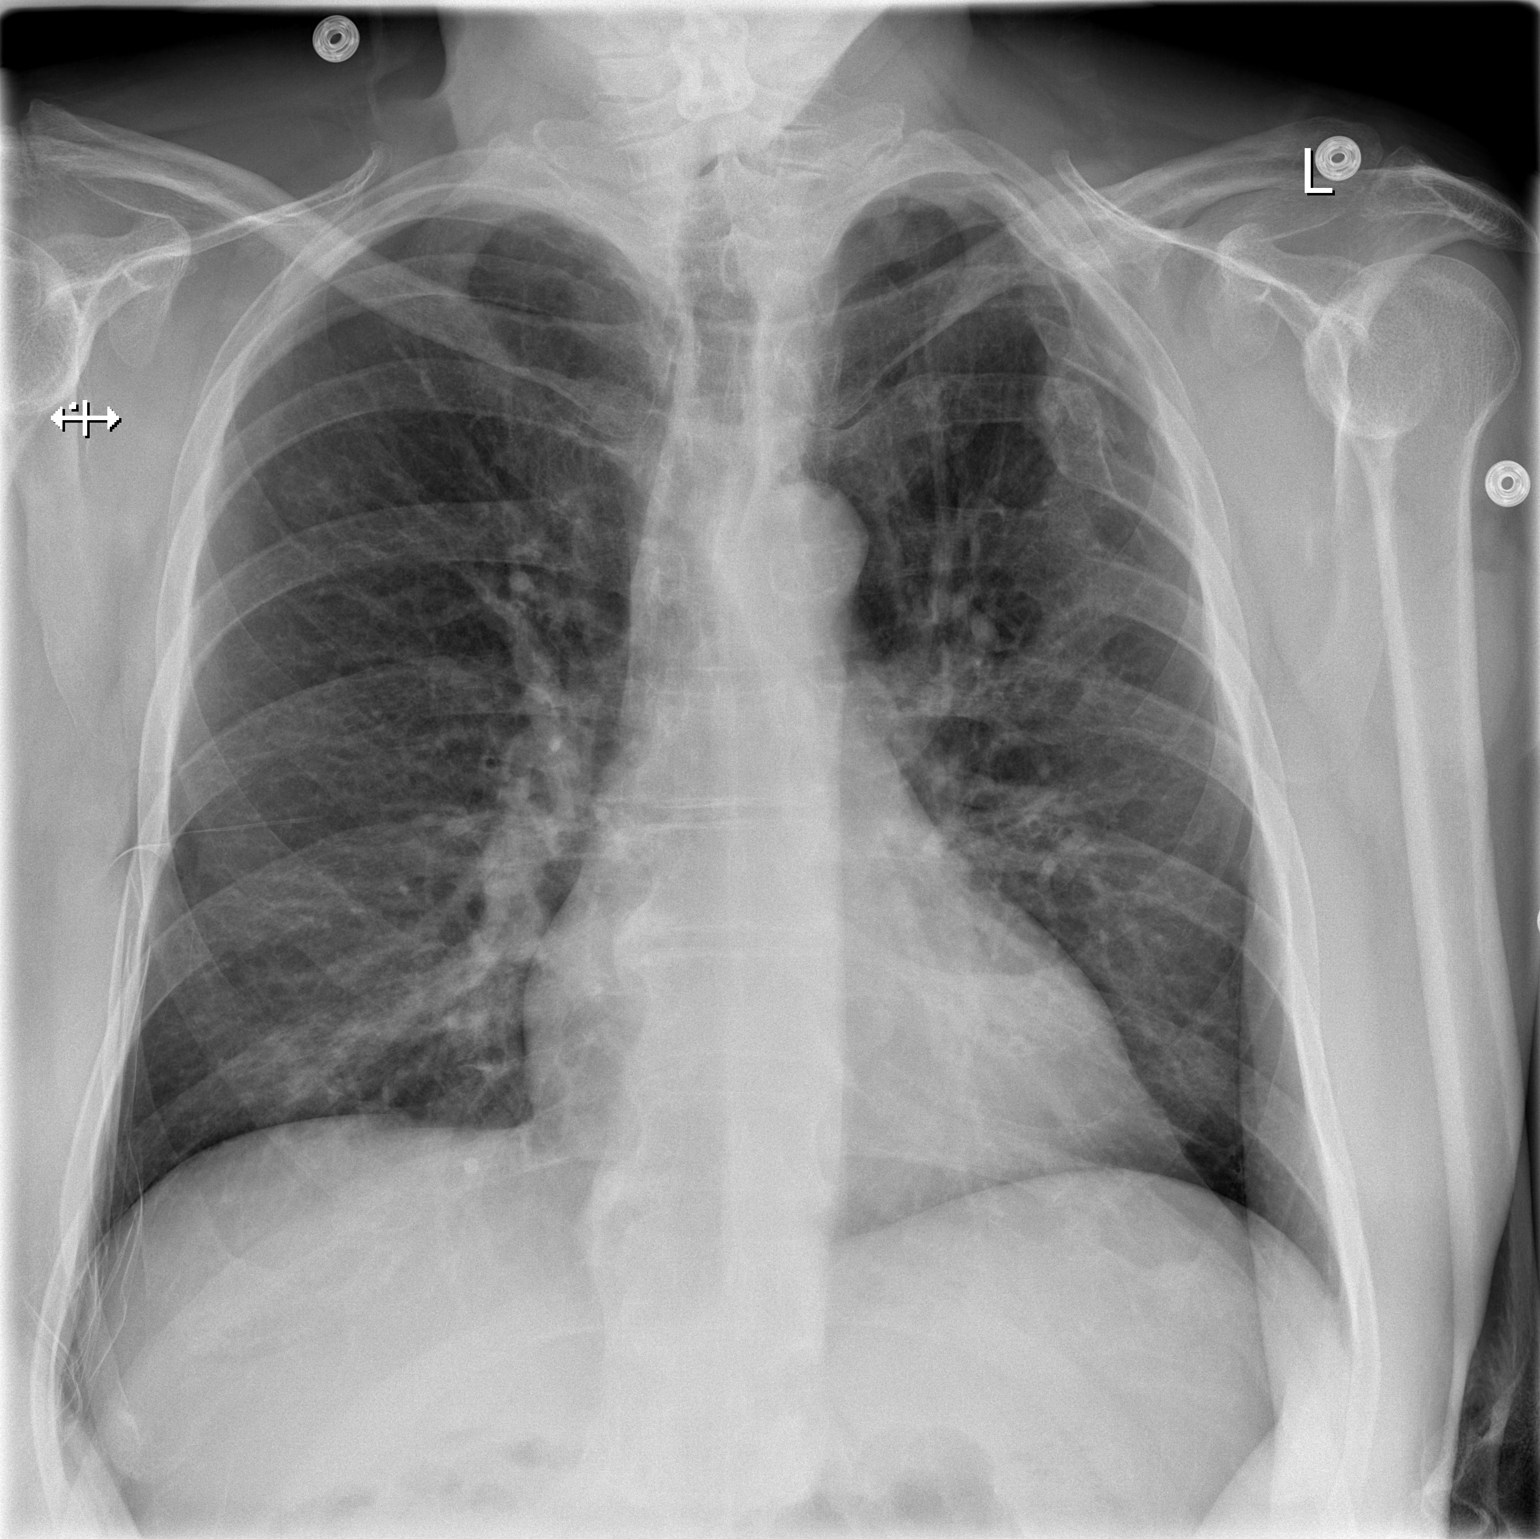

[w chest lat]
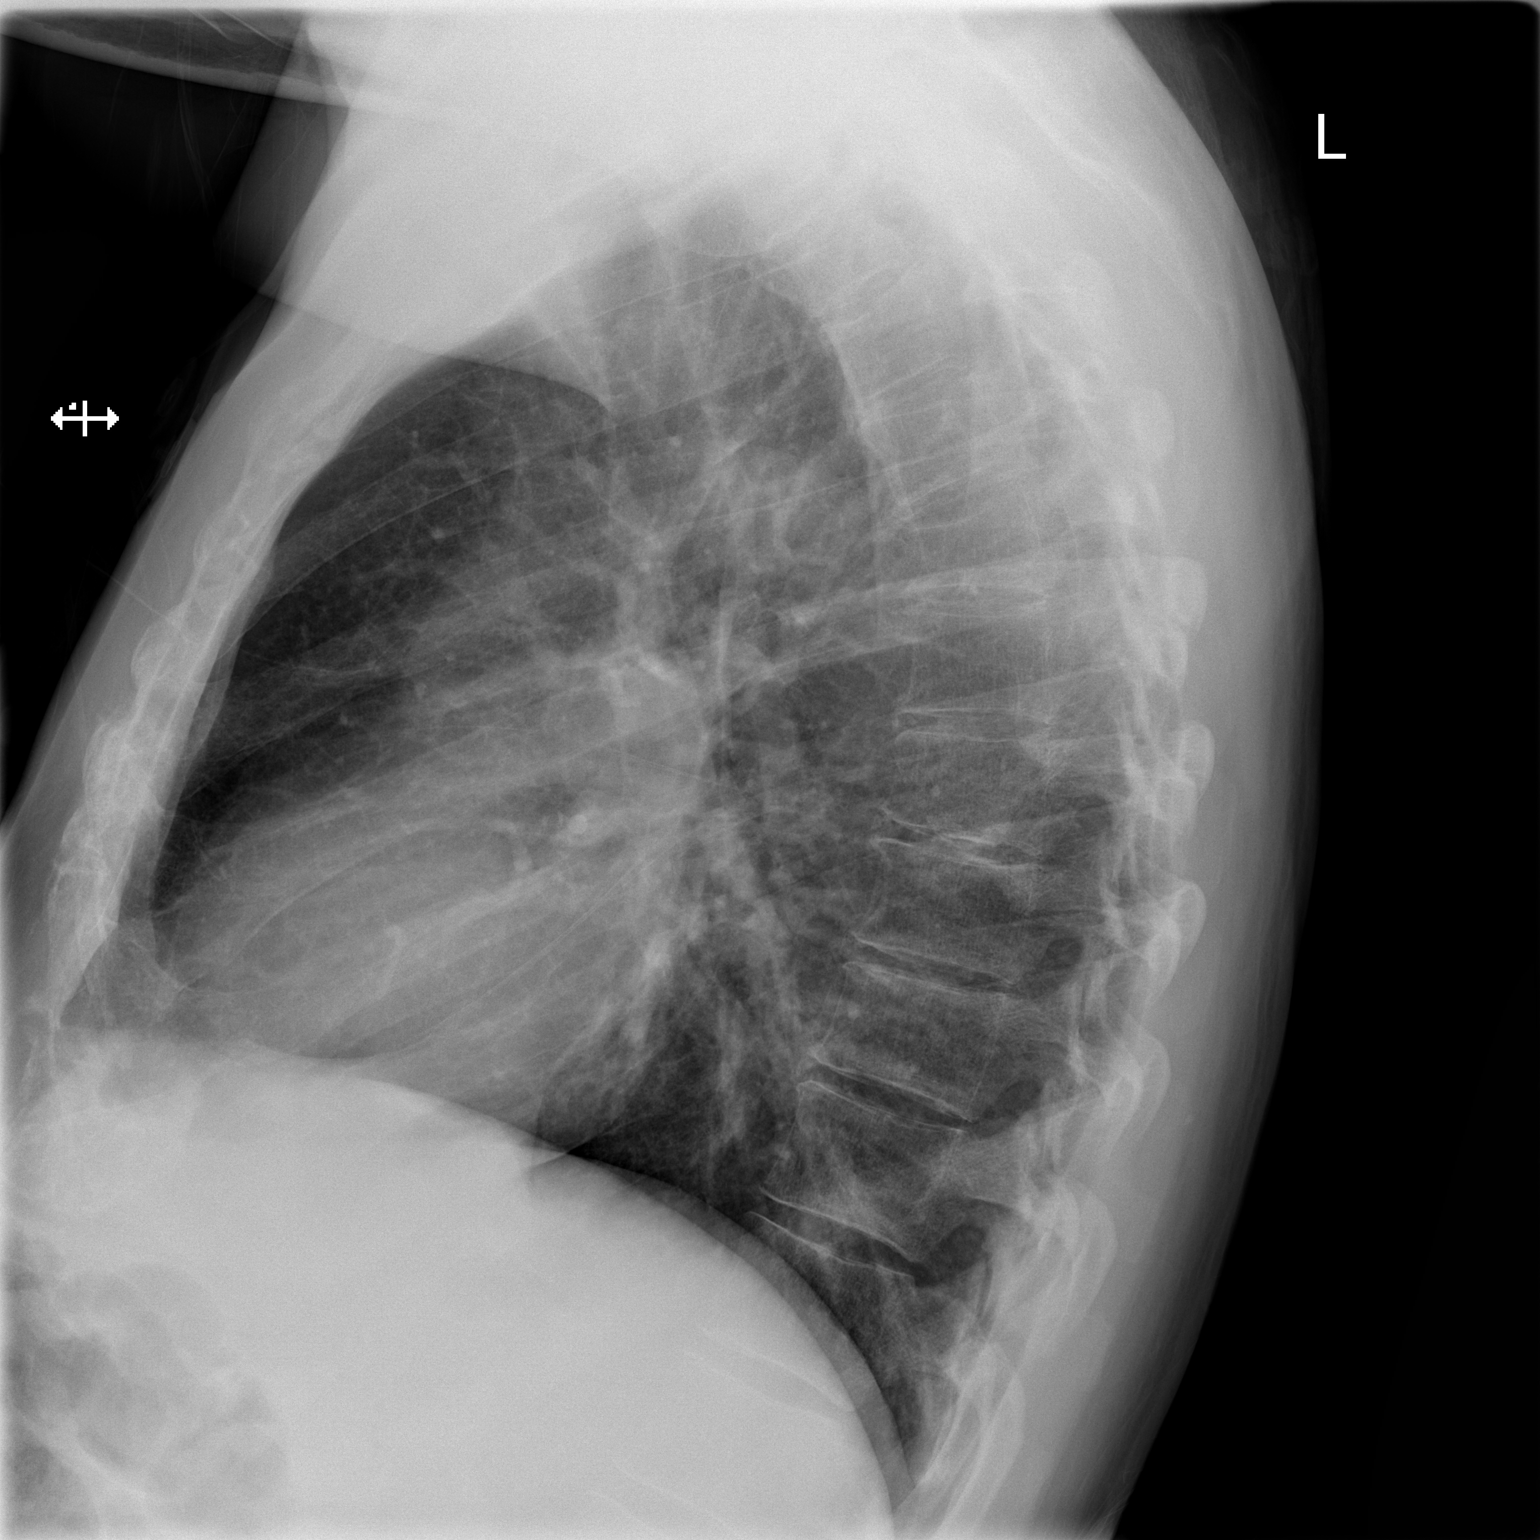

[2 of 2 positions shown; findings below may reference images not displayed]

FINDINGS: Borderline cardiomegaly with central vascular and
interstitial prominence.  Chronic central bronchitic changes as
before.  Prominent basilar vascular markings.  No definite focal
pneumonia, collapse, consolidation, effusion or pneumothorax.
Healed left upper rib fractures with chest wall deformity.  Lower
cervical fusion hardware noted.  Trachea is midline.
IMPRESSION: Stable exam with slight vascular and interstitial prominence.

Chronic bronchitic changes.

## 2014-05-21 IMAGING — CT CT HEAD W/O CM
2 series · 16 of 30 positions shown, 20 images · non-contrast
Comparison: MRI head 10/21/2011

CLINICAL DATA: Dizziness

CT HEAD WITHOUT CONTRAST
TECHNIQUE: Contiguous axial images were obtained from the base of
the skull through the vertex without contrast.

[Series 2: head w/o · axial · non-contrast · 0.49mm/px · z∈[+203,+353]mm · 13 of 36 slices shown, 17 images]
[im 3/36  brain]
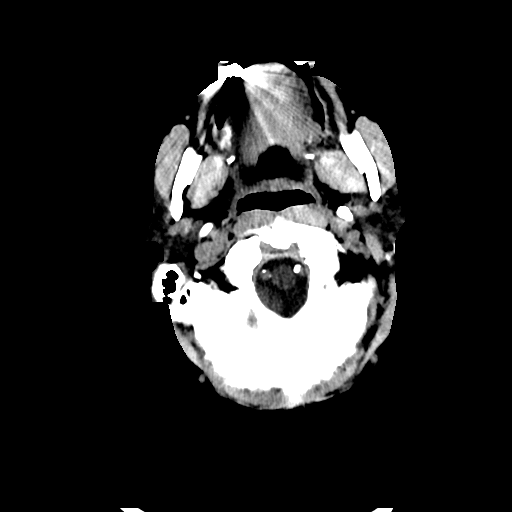
[im 3/36  bone]
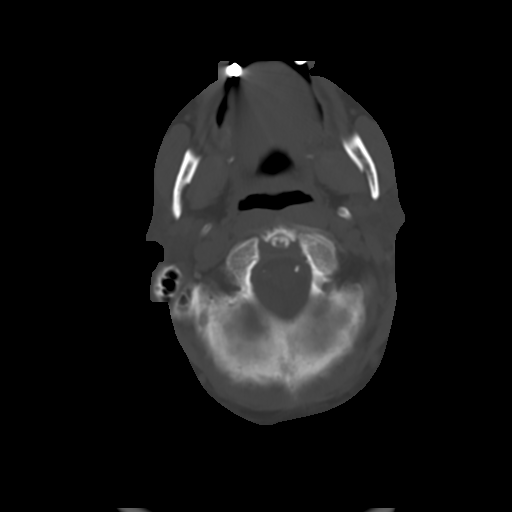
[im 6/36  brain]
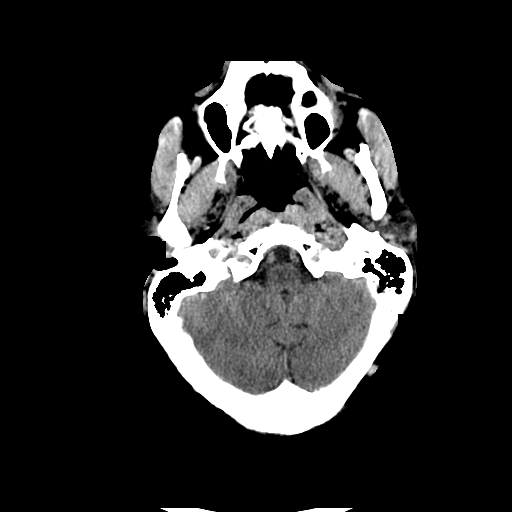
[im 8/36  brain]
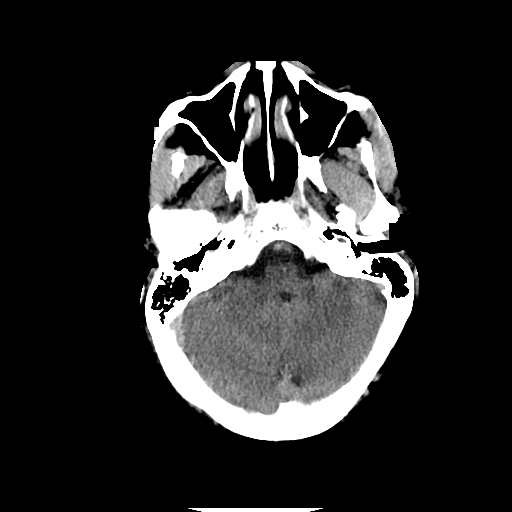
[im 11/36  brain]
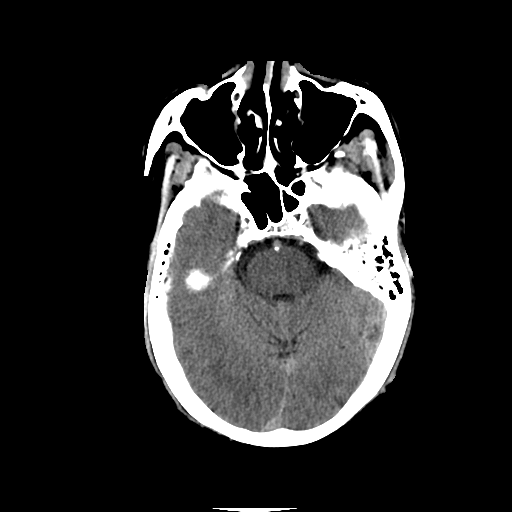
[im 13/36  brain]
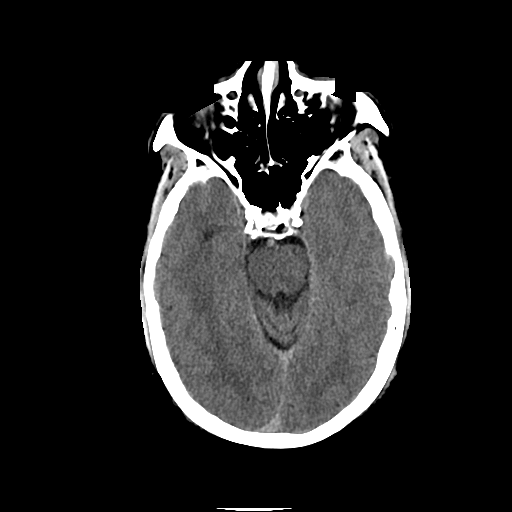
[im 13/36  bone]
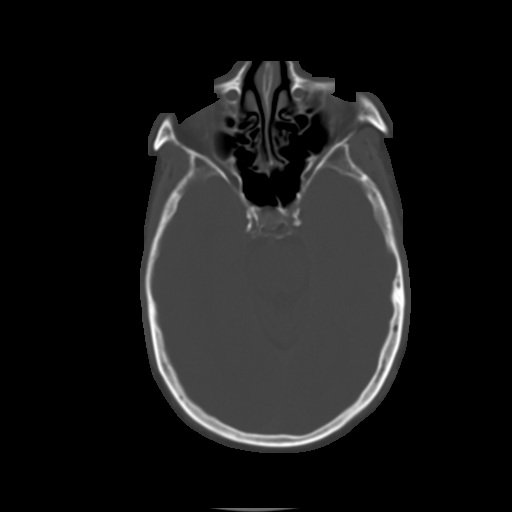
[im 16/36  brain]
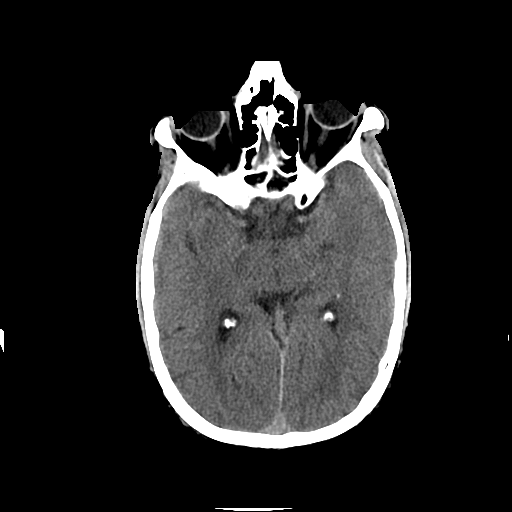
[im 18/36  brain]
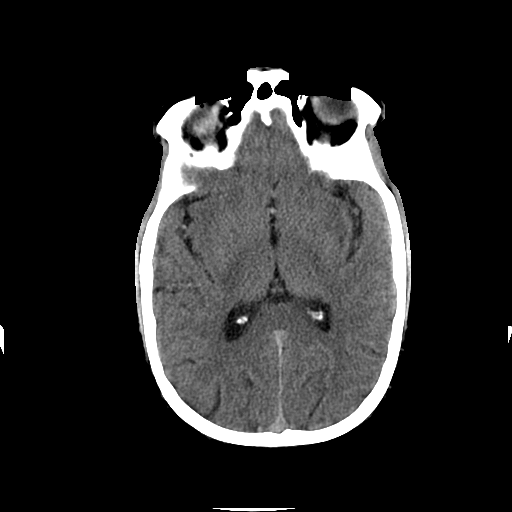
[im 21/36  brain]
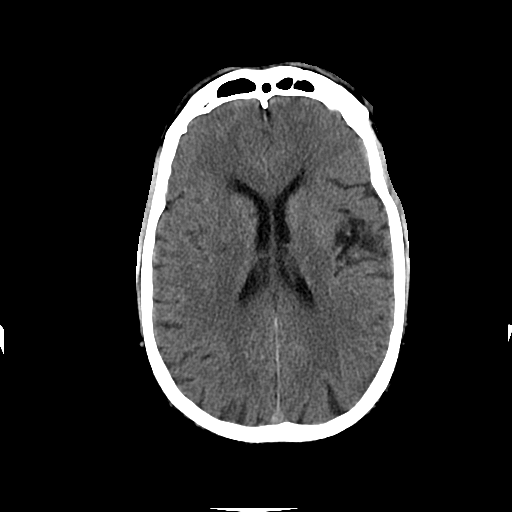
[im 23/36  brain]
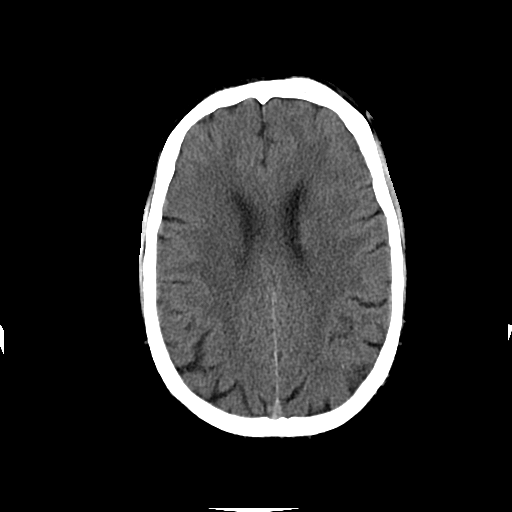
[im 23/36  bone]
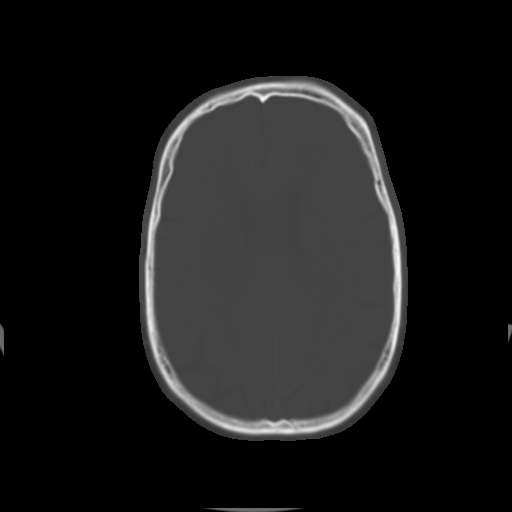
[im 26/36  brain]
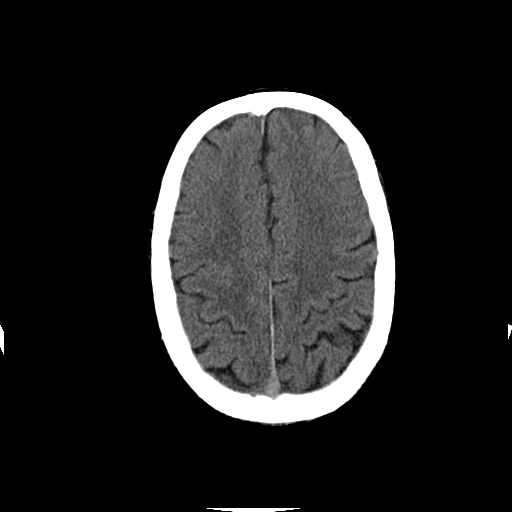
[im 28/36  brain]
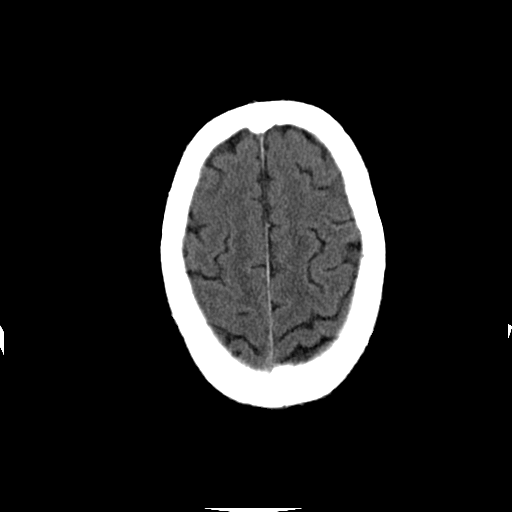
[im 31/36  brain]
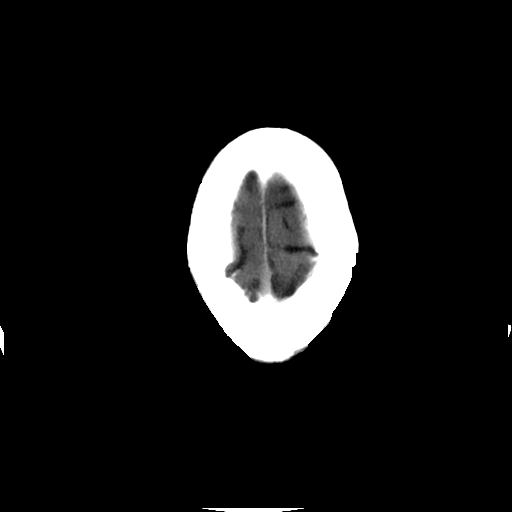
[im 33/36  brain]
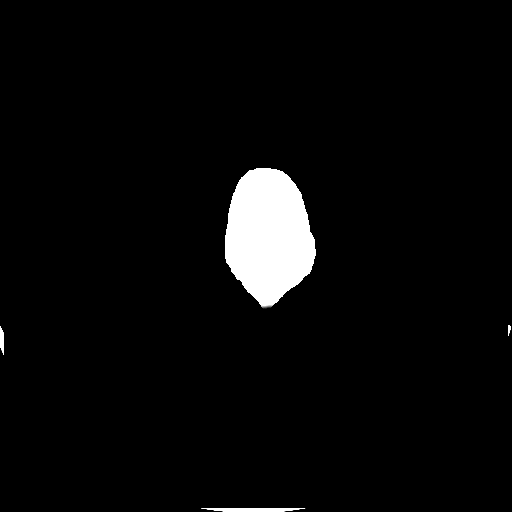
[im 33/36  bone]
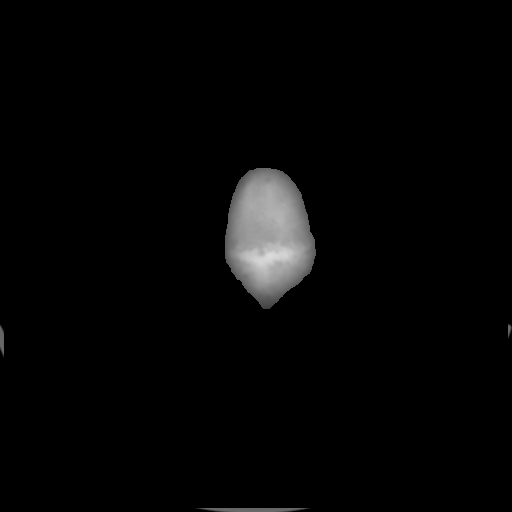

[Series 3: head w/o bone · axial · non-contrast · 0.49mm/px · z∈[+203,+253]mm · 3 of 36 slices shown]
[im 3/36  bone]
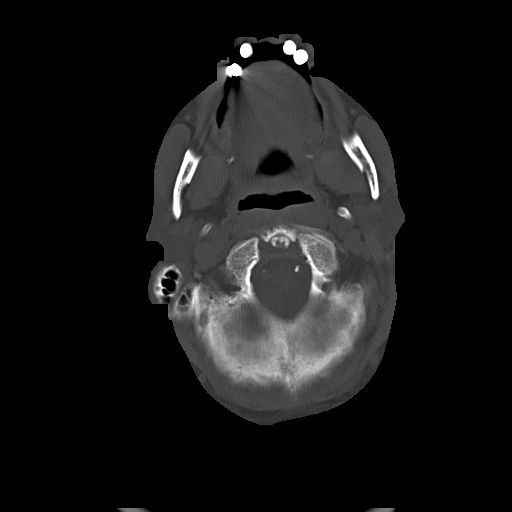
[im 8/36  bone]
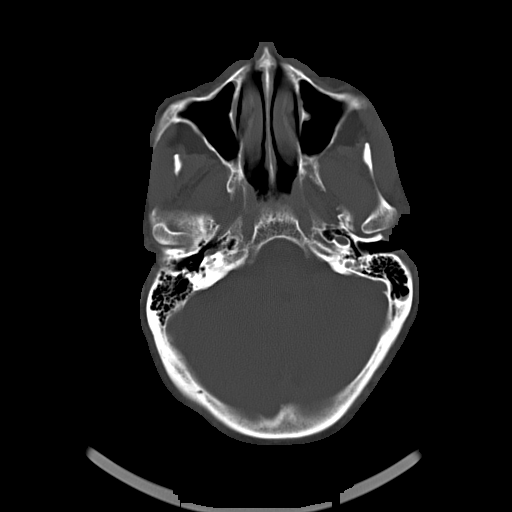
[im 13/36  bone]
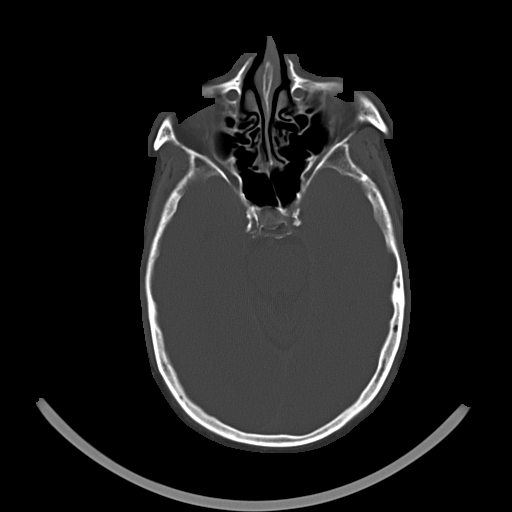

[16 of 30 positions shown; findings below may reference images not displayed]

FINDINGS: Mild atrophy.  Negative for hydrocephalus.  No acute
infarct or mass.  Negative for intracranial hemorrhage.  No acute
skull abnormality. Atherosclerotic calcification is present in the
carotid and vertebral arteries.
IMPRESSION: Mild atrophy without acute intracranial abnormality.

## 2014-05-21 NOTE — Telephone Encounter (Signed)
Thank you Bonnita Nasuti... I agree with your recommendation

## 2014-05-21 NOTE — ED Notes (Signed)
Pt sts sent here for eval of possible seizure that was unwitnessed; pt sts hx of brain infection and c/o pain in right pinky finger and right foot

## 2014-05-21 NOTE — ED Notes (Signed)
Pt sts he cannot wait anymore and is leaving

## 2014-05-21 NOTE — Telephone Encounter (Signed)
Spoke with pt to schedule, dpm °

## 2014-05-21 NOTE — Telephone Encounter (Signed)
Patient called, describing symptoms of "shaking, not being able to get up or even talk" for a short time, then being "exhausted after."  He wants to know if this is because of the "infection in my brain."  Patient denies taking his keppra.  He is advised that if this happens again, he should seek treatment. Patient doesn't want to go to the ED.  He has follow up with his PCP this week.  He states he will "start taking that medicine." Lavone Neri, Lanice Schwab, RN

## 2014-05-21 NOTE — Telephone Encounter (Signed)
Information faxed to [facility] for CT Head request

## 2014-05-21 NOTE — Telephone Encounter (Signed)
rec'd message from Dr. Donnetta Hutching to scheduled appt. For pt. To be evaluated on Monday, for swelling of right leg.  Discussed with Dr. Trula Slade, in office.  Rec'd order to schedule pt. For venous duplex of right leg and appt. Today.  Phone call to pt.  Advised of recommendation to have a venous scan of right leg today, and see the PA.  Pt. stated he didn't have transportation today.  Questioned pt. about present symptoms.  Reported he has a vein on right lateral lower leg that "popped out, and is sore."  Denied any swelling in the right foot or leg.   C/o burning in the soles of his feet with walking.  Denied right leg pain.  Discussed with Dr. Trula Slade.  Recommended to schedule appt. for office appt. with Dr. Donnetta Hutching tomorrow.  Will contact pt. For appt.

## 2014-05-21 NOTE — Telephone Encounter (Signed)
Pt calls and states he just had a stroke or a seizure, states he was out of it and just woke up appr 30 min ago, states he was jerking when he "fell out". He states he thinks he had a stroke or a seizure. He is advised to call 911 and he states his brother will take him to ED, agreed

## 2014-05-22 ENCOUNTER — Encounter: Payer: Self-pay | Admitting: Vascular Surgery

## 2014-05-22 ENCOUNTER — Telehealth: Payer: Self-pay | Admitting: Internal Medicine

## 2014-05-22 ENCOUNTER — Emergency Department (HOSPITAL_COMMUNITY)
Admission: EM | Admit: 2014-05-22 | Discharge: 2014-05-22 | Disposition: A | Discharge status: Home / Self Care | Payer: Medicaid Other | Source: Home / Self Care | Attending: Emergency Medicine | Admitting: Emergency Medicine

## 2014-05-22 ENCOUNTER — Encounter (HOSPITAL_COMMUNITY): Payer: Self-pay | Admitting: Emergency Medicine

## 2014-05-22 ENCOUNTER — Emergency Department (HOSPITAL_COMMUNITY)
Admission: EM | Admit: 2014-05-22 | Discharge: 2014-05-22 | Disposition: A | Discharge status: Home / Self Care | Payer: Medicaid Other | Attending: Emergency Medicine | Admitting: Emergency Medicine

## 2014-05-22 ENCOUNTER — Ambulatory Visit (INDEPENDENT_AMBULATORY_CARE_PROVIDER_SITE_OTHER): Payer: Self-pay | Admitting: Vascular Surgery

## 2014-05-22 ENCOUNTER — Emergency Department (HOSPITAL_COMMUNITY): Payer: Medicaid Other

## 2014-05-22 VITALS — BP 134/70 | HR 86 | Resp 18 | Ht 73.0 in | Wt 153.0 lb

## 2014-05-22 DIAGNOSIS — R519 Headache, unspecified: Secondary | ICD-10-CM

## 2014-05-22 DIAGNOSIS — Z8614 Personal history of Methicillin resistant Staphylococcus aureus infection: Secondary | ICD-10-CM

## 2014-05-22 DIAGNOSIS — M79651 Pain in right thigh: Secondary | ICD-10-CM | POA: Insufficient documentation

## 2014-05-22 DIAGNOSIS — Z720 Tobacco use: Secondary | ICD-10-CM

## 2014-05-22 DIAGNOSIS — Z8619 Personal history of other infectious and parasitic diseases: Secondary | ICD-10-CM | POA: Insufficient documentation

## 2014-05-22 DIAGNOSIS — Z87438 Personal history of other diseases of male genital organs: Secondary | ICD-10-CM

## 2014-05-22 DIAGNOSIS — E119 Type 2 diabetes mellitus without complications: Secondary | ICD-10-CM | POA: Insufficient documentation

## 2014-05-22 DIAGNOSIS — Z48812 Encounter for surgical aftercare following surgery on the circulatory system: Secondary | ICD-10-CM

## 2014-05-22 DIAGNOSIS — J449 Chronic obstructive pulmonary disease, unspecified: Secondary | ICD-10-CM

## 2014-05-22 DIAGNOSIS — F410 Panic disorder [episodic paroxysmal anxiety] without agoraphobia: Secondary | ICD-10-CM | POA: Insufficient documentation

## 2014-05-22 DIAGNOSIS — K219 Gastro-esophageal reflux disease without esophagitis: Secondary | ICD-10-CM | POA: Insufficient documentation

## 2014-05-22 DIAGNOSIS — Z87442 Personal history of urinary calculi: Secondary | ICD-10-CM | POA: Insufficient documentation

## 2014-05-22 DIAGNOSIS — Z87828 Personal history of other (healed) physical injury and trauma: Secondary | ICD-10-CM

## 2014-05-22 DIAGNOSIS — Z8701 Personal history of pneumonia (recurrent): Secondary | ICD-10-CM | POA: Insufficient documentation

## 2014-05-22 DIAGNOSIS — I10 Essential (primary) hypertension: Secondary | ICD-10-CM

## 2014-05-22 DIAGNOSIS — Z8673 Personal history of transient ischemic attack (TIA), and cerebral infarction without residual deficits: Secondary | ICD-10-CM | POA: Insufficient documentation

## 2014-05-22 DIAGNOSIS — R51 Headache: Principal | ICD-10-CM

## 2014-05-22 DIAGNOSIS — I509 Heart failure, unspecified: Secondary | ICD-10-CM | POA: Insufficient documentation

## 2014-05-22 DIAGNOSIS — Z79899 Other long term (current) drug therapy: Secondary | ICD-10-CM

## 2014-05-22 DIAGNOSIS — Z9181 History of falling: Secondary | ICD-10-CM

## 2014-05-22 DIAGNOSIS — Z7951 Long term (current) use of inhaled steroids: Secondary | ICD-10-CM

## 2014-05-22 DIAGNOSIS — Z95810 Presence of automatic (implantable) cardiac defibrillator: Secondary | ICD-10-CM

## 2014-05-22 DIAGNOSIS — I739 Peripheral vascular disease, unspecified: Secondary | ICD-10-CM

## 2014-05-22 DIAGNOSIS — Z8739 Personal history of other diseases of the musculoskeletal system and connective tissue: Secondary | ICD-10-CM

## 2014-05-22 DIAGNOSIS — Z792 Long term (current) use of antibiotics: Secondary | ICD-10-CM | POA: Insufficient documentation

## 2014-05-22 DIAGNOSIS — Z86711 Personal history of pulmonary embolism: Secondary | ICD-10-CM | POA: Insufficient documentation

## 2014-05-22 DIAGNOSIS — G894 Chronic pain syndrome: Secondary | ICD-10-CM | POA: Insufficient documentation

## 2014-05-22 LAB — CBC WITH DIFFERENTIAL/PLATELET
BASOS ABS: 0.1 10*3/uL (ref 0.0–0.1)
BASOS PCT: 1 % (ref 0–1)
EOS ABS: 0.3 10*3/uL (ref 0.0–0.7)
EOS PCT: 4 % (ref 0–5)
HEMATOCRIT: 39.8 % (ref 39.0–52.0)
HEMOGLOBIN: 13.4 g/dL (ref 13.0–17.0)
Lymphocytes Relative: 36 % (ref 12–46)
Lymphs Abs: 3.2 10*3/uL (ref 0.7–4.0)
MCH: 30.2 pg (ref 26.0–34.0)
MCHC: 33.7 g/dL (ref 30.0–36.0)
MCV: 89.6 fL (ref 78.0–100.0)
Monocytes Absolute: 0.7 10*3/uL (ref 0.1–1.0)
Monocytes Relative: 7 % (ref 3–12)
Neutro Abs: 4.7 10*3/uL (ref 1.7–7.7)
Neutrophils Relative %: 52 % (ref 43–77)
Platelets: 244 10*3/uL (ref 150–400)
RBC: 4.44 MIL/uL (ref 4.22–5.81)
RDW: 14.3 % (ref 11.5–15.5)
WBC: 9 10*3/uL (ref 4.0–10.5)

## 2014-05-22 LAB — BASIC METABOLIC PANEL
Anion gap: 10 (ref 5–15)
BUN: 19 mg/dL (ref 6–20)
CO2: 24 mmol/L (ref 22–32)
Calcium: 9.2 mg/dL (ref 8.9–10.3)
Chloride: 104 mmol/L (ref 101–111)
Creatinine, Ser: 1.06 mg/dL (ref 0.61–1.24)
GLUCOSE: 107 mg/dL — AB (ref 70–99)
Potassium: 3.8 mmol/L (ref 3.5–5.1)
Sodium: 138 mmol/L (ref 135–145)

## 2014-05-22 MED ORDER — IOHEXOL 300 MG/ML  SOLN
80.0000 mL | Freq: Once | INTRAMUSCULAR | Status: AC | PRN
  Administered 2014-05-22: 80 mL via INTRAVENOUS

## 2014-05-22 MED ORDER — HYDROCODONE-ACETAMINOPHEN 5-325 MG PO TABS
2.0000 | ORAL_TABLET | Freq: Once | ORAL | Status: DC
  Filled 2014-05-22: qty 2

## 2014-05-22 MED ORDER — SODIUM CHLORIDE 0.9 % IV BOLUS (SEPSIS)
1000.0000 mL | Freq: Once | INTRAVENOUS | Status: AC
  Administered 2014-05-22: 1000 mL via INTRAVENOUS

## 2014-05-22 MED ORDER — NICOTINE 21 MG/24HR TD PT24
21.0000 mg | MEDICATED_PATCH | Freq: Once | TRANSDERMAL | Status: DC
  Administered 2014-05-22: 21 mg via TRANSDERMAL
  Filled 2014-05-22: qty 1

## 2014-05-22 MED ORDER — OXYCODONE HCL 5 MG PO TABS
10.0000 mg | ORAL_TABLET | Freq: Once | ORAL | Status: AC
  Administered 2014-05-22: 10 mg via ORAL
  Filled 2014-05-22: qty 2

## 2014-05-22 NOTE — Telephone Encounter (Signed)
   Reason for call:   I received a page from the operator twice this PM from Mr. [name] .  The second page stated that he was calling about "seizures."  I attempted to call the number back but there was no answer.  Per chart review, he is being weaned off benzos by his PCP for polysubstance abuse.  Last refill of klonopin was on 04/29/2014 for a 30 day supply from Dr. Ozella Rocks. Prior to that he had a refill of klonopin on 04/18/2014 for a 30 day supply from [facility].     Pertinent Data:   None   Assessment / Plan / Recommendations:   Since patient did not answer the phone, I requested the hospital operator instruct him to come to the ED for evaluation should he call back.  Additionally, I will send a message to the [employer] front desk and triage to make an appointment for Mr. Hollibaugh to be evaluated.  Of note, according to the hospital operator, he came to the ED and was sitting there for ~5 hours and left?     Jones Bales, MD   05/22/2014, 12:46 AM

## 2014-05-22 NOTE — ED Notes (Signed)
MD at bedside. 

## 2014-05-22 NOTE — Discharge Instructions (Signed)
Abscess Mr. Pelcher, Your CT scan shows improvement of your brain abscess.  Continue to take antibiotics as prescribed and see your primary doctor within 3 days for close follow up.  If symptoms worsen, come back to the ED immediately. Thank you. An abscess (boil or furuncle) is an infected area on or under the skin. This area is filled with yellowish-white fluid (pus) and other material (debris). HOME CARE   Only take medicines as told by your doctor.  If you were given antibiotic medicine, take it as directed. Finish the medicine even if you start to feel better.  If gauze is used, follow your doctor's directions for changing the gauze.  To avoid spreading the infection:  Keep your abscess covered with a bandage.  Wash your hands well.  Do not share personal care items, towels, or whirlpools with others.  Avoid skin contact with others.  Keep your skin and clothes clean around the abscess.  Keep all doctor visits as told. GET HELP RIGHT AWAY IF:   You have more pain, puffiness (swelling), or redness in the wound site.  You have more fluid or blood coming from the wound site.  You have muscle aches, chills, or you feel sick.  You have a fever. MAKE SURE YOU:   Understand these instructions.  Will watch your condition.  Will get help right away if you are not doing well or get worse. Document Released: 06/17/2007 Document Revised: 06/30/2011 Document Reviewed: 03/13/2011 [facility] Patient Information 2015 [place], [place]. This information is not intended to replace advice given to you by your health care provider. Make sure you discuss any questions you have with your health care provider.

## 2014-05-22 NOTE — ED Notes (Signed)
Pt talking angrily to this NT about how long he has been here and the fact the he has not had any pain meds. This NT told him that I would talk with his nurse about pain meds. PT was trying to yank his IV out so the RN told This NT to take it out and he would not allow me to hold pressure on it.

## 2014-05-22 NOTE — Telephone Encounter (Signed)
Received a third page from the hospital operator at 2:10AM stating that he was sitting in the ED waiting room and requested that I call him back.  I spoke with the ED and said he was indeed in the waiting area and that he had left initially because he couldn't wait and then he returned.  He apparently told the ED that the [facility] told them to call us when he got to the ED.  No one from the [facility] told [name] to page or call us when he got to the ED.    I see multiple telephone encounters regarding [name] and the [facility] regarding his requesting benzos.  I feel that he is abusing the after hours paging system and he should be aware that calling the after hours pager for refills of benzos is NOT appropriate.    Michail Jewels, MD

## 2014-05-22 NOTE — ED Notes (Signed)
Pt presents with upper right leg pain. Pt also reports he was recently diagnosed with a brain infection.

## 2014-05-22 NOTE — ED Notes (Signed)
Pt. reports right forearm pain / spasms and right thigh pain onset yesterday , denies injury/ambulatory.

## 2014-05-22 NOTE — Progress Notes (Signed)
Presents today as an add-on. Was concern regarding fullness in his right lateral thigh. Reports that he spent the night in the emergency room for evaluation of blackout spell.  He is his usual self but is alert and oriented today. His area of concern is actually is subcutaneous bypass. He has a very nicely developed femoral to anterior tibial bypass it runs diagonally across lateral aspect of his thigh lateral knee and down to the anterior tibial anastomosis. He has excellent pulsation to this and has a normal 2+ dorsalis pedis pulse.  He has chronic pain in multiple places. 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 He has requested the pain medicine and was written for Percocet 10/325 #30 no refills. I explained that this is his final pain medication from his recent operation on 04/16/2014 and that he will have to become established with pain management specialist. He will see [place] again as scheduled in 3 months with follow-up of his bypass.

## 2014-05-22 NOTE — ED Notes (Signed)
Pt reports he has to smoke, pt informed he is not allowed to leave department and come back, pt informed this RN that I cannot tell him what to do and that he will do what he wants. Charge RN informed.
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
## 2014-05-22 NOTE — ED Notes (Signed)
CT called to inquire about when the pt can go to CT.

## 2014-05-23 ENCOUNTER — Ambulatory Visit: Payer: Self-pay | Admitting: Internal Medicine

## 2014-05-24 NOTE — Addendum Note (Signed)
Addended by: Dorthula Rue L on: 05/24/2014 02:31 PM   Modules accepted: Orders

## 2014-05-25 ENCOUNTER — Other Ambulatory Visit: Payer: Self-pay

## 2014-05-28 ENCOUNTER — Encounter: Payer: Self-pay | Admitting: Internal Medicine

## 2014-05-28 NOTE — Progress Notes (Unsigned)
Patient ID: [name], male   DOB: May 11, 1960, 54 y.o.   MRN: [id number]  Regarding [name]'s clonazepam use, see separate documentation including documentation on 05/15/14. In short, we are weaning his clonazepam and during [place] controlled substance database search, he has gotten prescription from provider in [place] that he is filling locally in [place]. I received a copy of the clinic note from Dr Lorne Skeens in [place]. She saw [name] (same birthday [birth year]) on 03/31/14 as an established patient and noted that his anxiety appeared well-controlled with current regimen. He denied any illicit drug use. He asked for clonazepam 1 mg tid (original dose before we began wean). Dr Lorne Skeens planned to call his pharmacy to confirm and wrote for clonazepam 0.5 mg bid #60 with two refills.  Lottie Mussel, MD Internal Medicine, PGY-1 Pager [phone] 05/28/2014, 8:17 PM

## 2014-05-30 ENCOUNTER — Encounter: Payer: Self-pay | Admitting: Licensed Clinical Social Worker

## 2014-05-30 NOTE — Progress Notes (Signed)
[name] presents to [facility] today requesting a letter for his housing needs.  Pt states he is attempting to apply for low-income housing; however, he has lost all of his photo identification.  [name] unable to obtain replacements until he has funds available next month.  Pt states Social Security requested he bring a letter from his physicians office confirming his demographics.  CSW provided letter utilizing chart information and scanned documents.  Letter provided to [name].  CSW printed scanned insurance card on file and [place] ID on file to letterhead.  Pt denies add'l social work needs at this time.

## 2014-06-04 ENCOUNTER — Ambulatory Visit (INDEPENDENT_AMBULATORY_CARE_PROVIDER_SITE_OTHER): Payer: Medicaid Other | Admitting: Internal Medicine

## 2014-06-04 ENCOUNTER — Telehealth: Payer: Self-pay | Admitting: Licensed Clinical Social Worker

## 2014-06-04 VITALS — BP 129/71 | HR 85 | Temp 97.6°F | Ht 72.0 in | Wt 149.0 lb

## 2014-06-04 DIAGNOSIS — F329 Major depressive disorder, single episode, unspecified: Secondary | ICD-10-CM

## 2014-06-04 DIAGNOSIS — F32A Depression, unspecified: Secondary | ICD-10-CM

## 2014-06-04 DIAGNOSIS — R894 Abnormal immunological findings in specimens from other organs, systems and tissues: Secondary | ICD-10-CM | POA: Diagnosis not present

## 2014-06-04 DIAGNOSIS — R768 Other specified abnormal immunological findings in serum: Secondary | ICD-10-CM

## 2014-06-04 DIAGNOSIS — G060 Intracranial abscess and granuloma: Secondary | ICD-10-CM | POA: Diagnosis not present

## 2014-06-04 MED ORDER — AMOXICILLIN 875 MG PO TABS: 875.0000 mg | ORAL_TABLET | Freq: Two times a day (BID) | ORAL | Status: DC

## 2014-06-04 NOTE — Progress Notes (Signed)
Subjective:    Patient ID: [name], male    DOB: 1960-02-16, 54 y.o.   MRN: [id number]  HPI 54yo M with history of streptococcal brain abscess, has been on antibiotics for 6 months. Continues to take amoxicillin. Did temp stop keppra and had a seizure in beginning of the month, seen in ED and subsequently restarted on keppra. While in ED, did have repeat head CT that commented on residual brain lesions. He is still somewhat estranged from his ex-wife, active drug user, and his sons. He remains free of drug use.  Current Outpatient Prescriptions on File Prior to Visit  Medication Sig Dispense Refill  . albuterol (PROVENTIL HFA;VENTOLIN HFA) 108 (90 BASE) MCG/ACT inhaler Inhale 2 puffs into the lungs 2 (two) times daily.     Marland Kitchen amoxicillin (AMOXIL) 875 MG tablet Take 1 tablet (875 mg total) by mouth 2 (two) times daily. 60 tablet 1  . clonazePAM (KLONOPIN) 0.5 MG tablet Take 1 tablet (0.5 mg total) by mouth daily. 30 tablet 0  . diclofenac sodium (VOLTAREN) 1 % GEL Apply 2 g topically 2 (two) times daily as needed (knee pain).     . Fluticasone-Salmeterol (ADVAIR DISKUS) 100-50 MCG/DOSE AEPB Inhale 1 puff into the lungs 2 (two) times daily. 60 each 3  . gemfibrozil (LOPID) 600 MG tablet Take 1 tablet (600 mg total) by mouth 2 (two) times daily. 180 tablet 1  . hydrocortisone cream 1 % Apply 1 application topically daily as needed for itching.    . hydrOXYzine (ATARAX/VISTARIL) 25 MG tablet Take 1 tablet (25 mg total) by mouth 2 (two) times daily as needed for itching. (Patient taking differently: Take 25 mg by mouth 2 (two) times daily as needed for itching. ) 60 tablet 0  . Ipratropium-Albuterol (COMBIVENT RESPIMAT) 20-100 MCG/ACT AERS respimat Inhale 1 puff into the lungs every 6 (six) hours as needed for wheezing or shortness of breath ((May take up to 6 puffs daily if needed.).    Marland Kitchen levETIRAcetam (KEPPRA) 500 MG tablet Take 1 tablet (500 mg total) by mouth 2 (two) times daily. 180 tablet  1  . Multiple Vitamin (MULTIVITAMIN WITH MINERALS) TABS Take 1 tablet by mouth daily.    . nicotine (NICODERM CQ - DOSED IN MG/24 HOURS) 14 mg/24hr patch Place 1 patch (14 mg total) onto the skin daily. (Patient not taking: Reported on 05/21/2014) 14 patch 0  . nicotine (NICODERM CQ - DOSED IN MG/24 HR) 7 mg/24hr patch Place 1 patch (7 mg total) onto the skin daily. (Patient not taking: Reported on 05/21/2014) 14 patch 0  . ondansetron (ZOFRAN) 4 MG tablet Take 1 tablet (4 mg total) by mouth every 8 (eight) hours as needed for nausea or vomiting. 20 tablet 0  . oxyCODONE-acetaminophen (PERCOCET/ROXICET) 5-325 MG per tablet Take 1-2 tablets by mouth every 6 (six) hours as needed for severe pain. (Patient not taking: Reported on 05/22/2014) 15 tablet 0  . pantoprazole (PROTONIX) 40 MG tablet Take 1 tablet (40 mg total) by mouth daily. 90 tablet 0  . PARoxetine (PAXIL) 20 MG tablet TAKE ONE (1) TABLET BY MOUTH EVERY DAY (Patient not taking: Reported on 05/21/2014) 30 tablet 2  . ranitidine (ZANTAC) 150 MG tablet Take 1 tablet (150 mg total) by mouth 2 (two) times daily as needed for heartburn. 60 tablet 2  . terbinafine (LAMISIL) 250 MG tablet Take 1 tablet (250 mg total) by mouth daily. 84 tablet 0  . Vitamins A & D (VITAMIN A &  D) ointment Apply 1 application topically 2 (two) times daily. Apply to bilateral lower extremities and feet.    Alveda Reasons 20 MG TABS tablet TAKE ONE (1) TABLET BY MOUTH EVERY DAY WITH SUPPER 30 tablet 0  . [DISCONTINUED] topiramate (TOPAMAX) 25 MG tablet Take 25 mg by mouth 2 (two) times daily.       No current facility-administered medications on file prior to visit.   Marland Kitchen   Head CT 05/22/14: FINDINGS: Visualized paranasal sinuses and mastoids are clear. Stable visualized osseous structures. No acute orbit or scalp soft tissue finding. Calcified atherosclerosis at the skull base.  Stable cerebral volume. No ventriculomegaly. No midline shift. Basilar cisterns are  patent.  Small hyperdense lesion in the right corona radiata shows mild regression since March, now 9-10 mm diameter versus 12 mm at that time. The margins are less distinct. Surrounding hypodensity/edema has also regressed. No significant regional mass effect. Following contrast there is still residual enhancement of this lesion, but smaller -now 6 mm versus 9 mm previously.  The 2 additional treated lesions also still enhance but have decreased in size and are much less conspicuous on precontrast studies.  Left insula and operculum encephalomalacia again noted. No evidence of cortically based acute infarction identified. No acute intracranial hemorrhage identified. No new abnormal enhancement. Major vascular structures are enhancing.  IMPRESSION: 1. Mild further regression of 3 treated cerebral abscesses since March. All 3 are still enhancing. No significant intracranial mass effect. 2. No new intracranial abnormality.  Review of Systems + depression, no suicide ideation. Weight loss, poor appetite that he attributes to mood, food insecurity    Objective:   Physical Exam BP 129/71 mmHg  Pulse 85  Temp(Src) 97.6 F (36.4 C) (Oral)  Ht 6' (1.829 m)  Wt 149 lb (67.586 kg)  BMI 20.20 kg/m2 Physical Exam  Constitutional: He is oriented to person, place, and time. He appears well-developed and under-nourished. No distress. Chronically ill appearing HENT: poor dentition, no thrush Mouth/Throat: Oropharynx is clear and moist. No oropharyngeal exudate.  Cardiovascular: Normal rate, regular rhythm and normal heart sounds. Exam reveals no gallop and no friction rub.  No murmur heard.  Pulmonary/Chest: Effort normal and breath sounds normal. No respiratory distress. He has no wheezes.         Assessment & Plan:  Brain abscess = repeat head ct still shows residual lesions. Will continue on amoxicillin for addn 6 wk then stop and observe off of treatment  Hepatitis c ab =  positive only. No viremia. No need for treatment  Depression = he vehemently denies any thoughts of hurting himself, did provide him resources for counseling  rtc in 6-8wk

## 2014-06-04 NOTE — Telephone Encounter (Signed)
Mr. Frangos placed call to [employer] and left message requesting letter stating his dog is a "service dog".  Pt states service animals do not require a deposit with his apartment complex.  CSW returned call to Mr. Nix, message left stating request is being forwarded to PCP.  CSW provided contact hours and call back number for questions.

## 2014-06-04 NOTE — Telephone Encounter (Signed)
Message sent to PCP.  CSW will sign off.

## 2014-06-04 NOTE — Telephone Encounter (Signed)
I do not think this has been arranged through our clinic. I am not sure why he thinks his dog is a service dog or for what need this dog meets. He is overdue for a clinic appointment, he may discuss this when he comes to our clinic.

## 2014-06-05 NOTE — Telephone Encounter (Signed)
CSW placed call to [name] to notify pt, PCP unaware of pt having service dog and what needs the dog meets.  Pt notified.  [name] states he lost his dog's service ID tags and was told it would take up to 14 days to receive new ID's in mail.  CSW encouraged [name] to contact [facility] and schedule his overdue clinic appointment 14 days out to discuss service dog.  Pt informed to bring all documentation regarding his dog being a service dog to provide and discuss with physician during visit.  Pt aware.  CSW will sign off.

## 2014-06-05 NOTE — Telephone Encounter (Signed)
Thank you.  I will let him know to schedule his overdue appointment and discuss during appointment.

## 2014-06-11 IMAGING — XA IR ANGIO INTRA EXTRACRAN SEL COM CAROTID INNOMINATE BILAT MOD SE
1 of 2 series · 12 of 24 positions shown · IV contrast (IODINE)
Comparison: Catheter angiogram of 04/09/2011.

CLINICAL DATA: Patient with intermittent left-sided numbness and
weakness.  History of left-sided cerebral hemispheric ischemic
strokes.

BILATERAL COMMON CAROTID ARTERIOGRAMS AND BILATERAL VERTEBRAL
ARTERY ANGIOGRAMS

[Series 300: neuro · 12 of 162 slices shown]
[im 1/162]
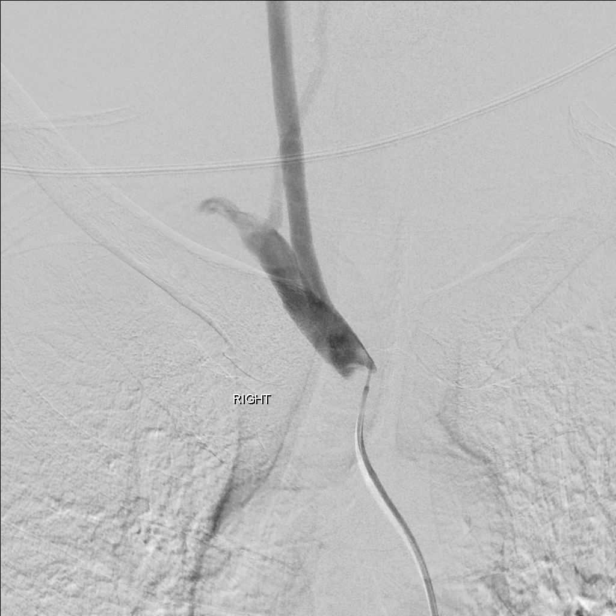
[im 15/162]
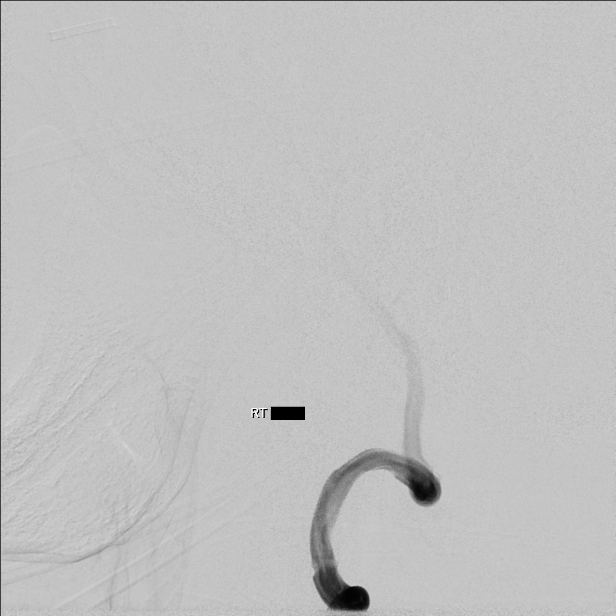
[im 30/162]
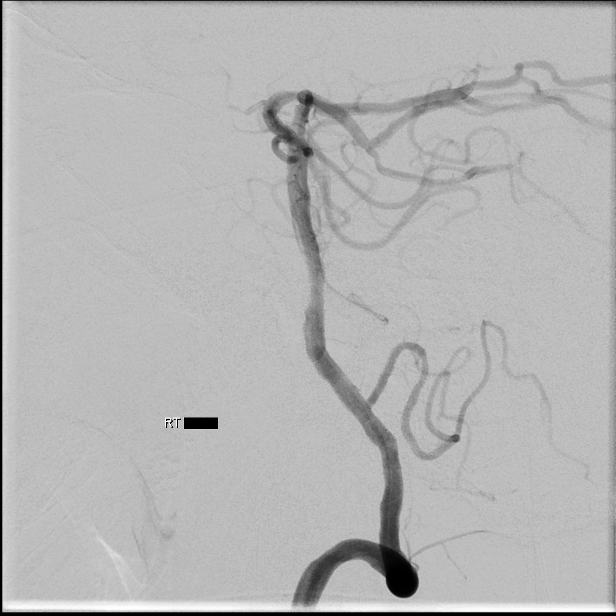
[im 44/162]
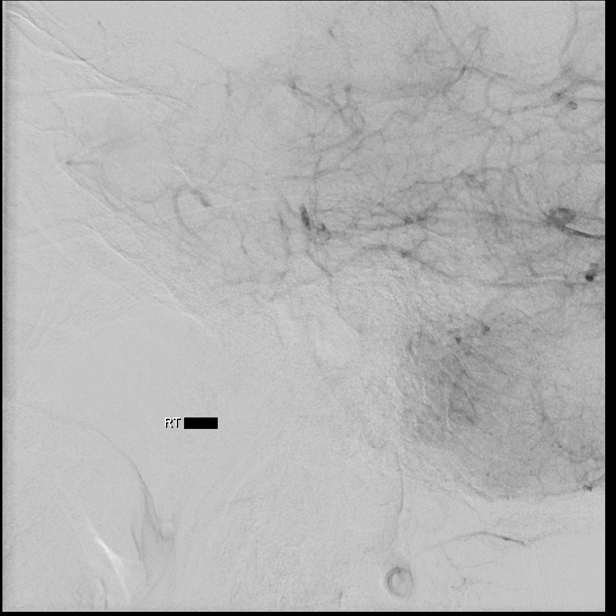
[im 59/162]
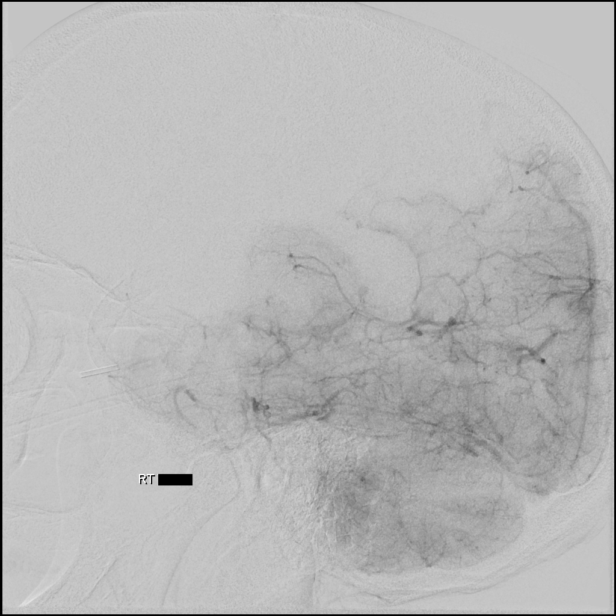
[im 74/162]
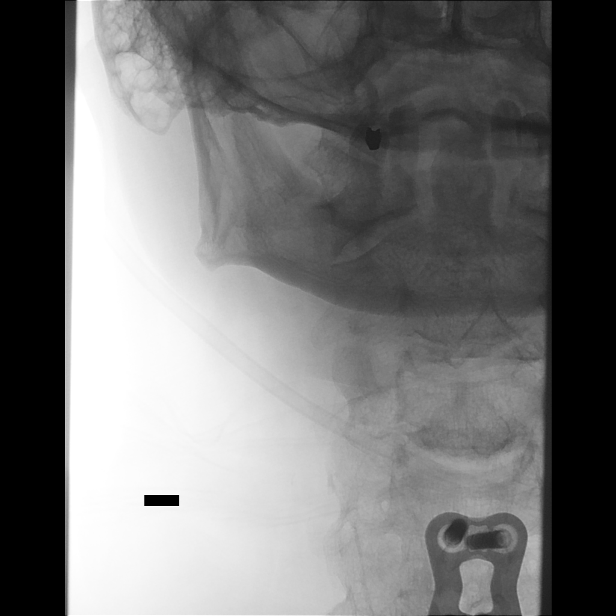
[im 88/162]
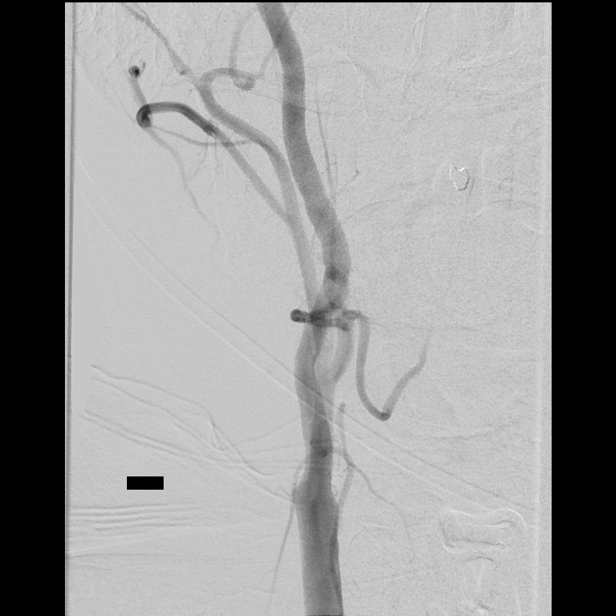
[im 103/162]
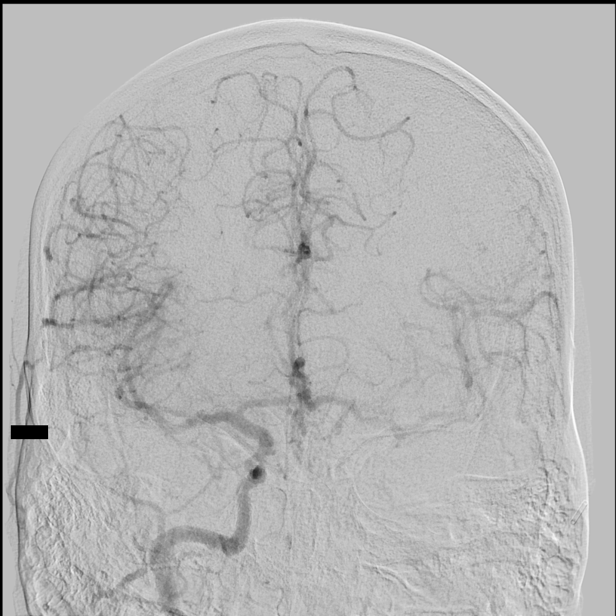
[im 118/162]
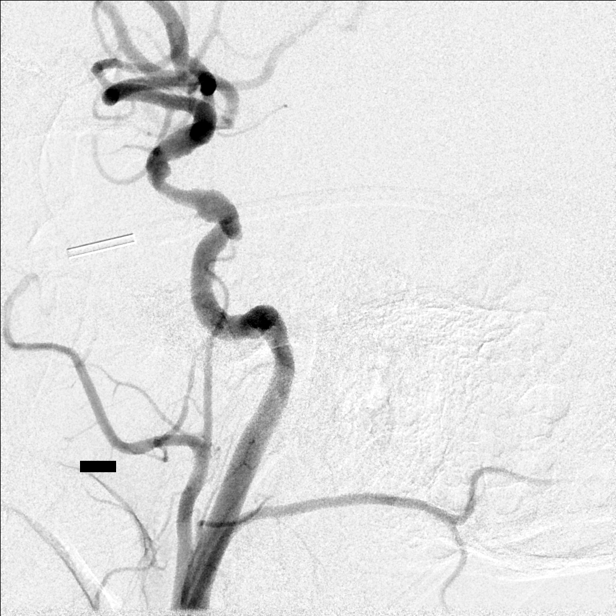
[im 132/162]
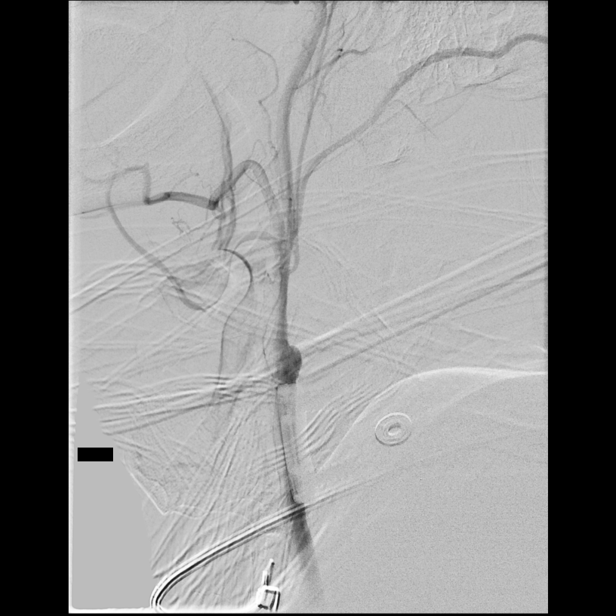
[im 147/162]
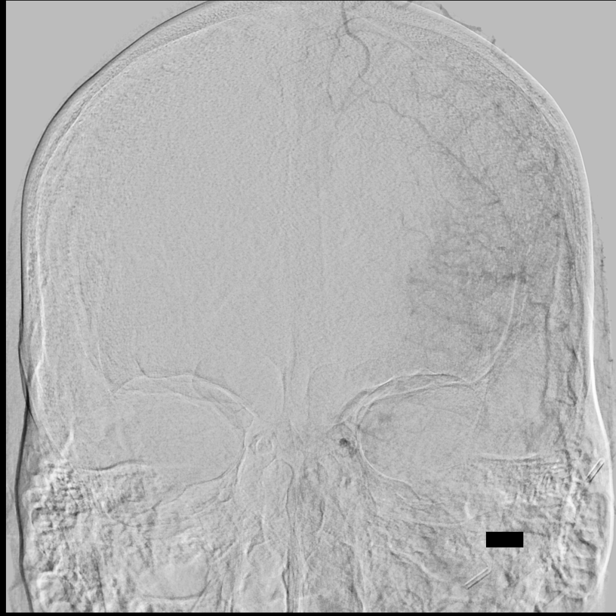
[im 162/162]
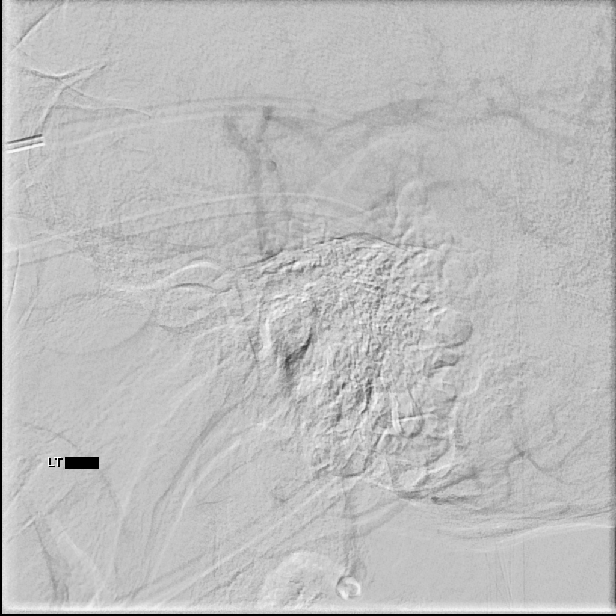

[12 of 24 positions shown; findings below may reference images not displayed]

Following a full explanation of the procedure, an informed
witnessed consent was obtained.

The right groin was prepped in a sterile fashion in the usual
manner.

Using a modified Seldinger technique, transfemoral access into the
right common femoral artery was obtained without difficulty.  Over
a 0.035-inch guidewire, a 5-French Pinnacle sheath was inserted.
Through this and also over a 0.035-inch guidewire, a 5-French JB1
catheter was advanced to the aortic arch region and selectively
positioned in the right common carotid artery, the right vertebral
artery, the left common carotid artery and the left subclavian
artery.

There were no acute complications.  The patient tolerated the
procedure well.

Medications utilized: Versed 1 mg IV.  Fentanyl 25 mcg IV.

Contrast: Cmnipaque-SYY approximately 55 ml.
FINDINGS: The right vertebral artery origin demonstrates
approximately 50% stenosis. The vessel otherwise opacifies normally
to the cranial skull base.

There is normal opacification of the right posterior inferior
cerebellar artery and the right vertebrobasilar junction.

Mild atherosclerotic narrowing is seen of the proximal basilar
artery.  The posterior cerebral arteries, the superior cerebellar
arteries and the anterior inferior cerebellar arteries opacify
normally into capillary and venous phases.

Extensive leptomeningeal collaterals are seen retrogradely
opacifying the posterior left middle cerebral artery distribution.

The right common carotid arteriogram demonstrates the right
external carotid artery and its major branches to be normal.

The right internal carotid artery at the bulb has a smooth shallow
plaque along the posterior wall.

No significant narrowing is seen.

The vessel otherwise opacifies normally to the cranial skull base.

There is tapered narrowing of the proximal cavernous segment
leading to a tight focal stenosis of approximately 65-70%.  Distal
to this mild atherosclerotic irregularity is seen.

Also demonstrated is suspicion of a small focal outpouching in the
superior hypophyseal region.

This too remains stable.

The right middle and the right anterior cerebral arteries opacify
normally into capillary and venous phases.

Prompt almost simultaneous opacification is seen of the left middle
cerebral artery and the left anterior cerebral artery via the
anterior communicating artery.  Mixing with unopacified blood is
seen in the left middle cerebral artery.

The left common carotid arteriogram demonstrates complete
angiographic occlusion of the left internal carotid artery at the
bulb.

There is distal reconstitution of the left cavernous ICA which is
hypoplastic from the ipsilateral ophthalmic artery.

Antegrade flow is seen into the left middle cerebral artery
distribution.

The left external carotid artery and its major branches are
normally opacified.

The left vertebral artery origin is normal.  The vessel opacifies
normally to the cranial skull base.  There is normal opacification
of the left vertebrobasilar junction and the left posterior
inferior cerebellar artery.

Mild atherosclerotic irregularities are seen involving the
vertebrobasilar junction just proximal to the posterior inferior
cerebellar artery.

The opacified portion of the basilar artery, the posterior cerebral
arteries, the superior cerebellar arteries and the anterior-
inferior cerebellar arteries is normal into capillary and venous
phases.  Unopacified blood is seen in the basilar artery from a
contralateral vertebral artery.

IMPRESSION
1.  65-70% stenosis of the right internal carotid artery in the
proximal cavernous segment, appearing stable.
2.  50% stenosis of the right vertebral artery at its origin also
stable.
3.  Partial reconstitution of the left internal carotid artery in
the cavernous segment from the ipsilateral ophthalmic artery and
then subsequently the left middle cerebral artery.

The angiographic findings were reviewed with the patient and the
patient's girlfriend.

Clinically the patient has remained stable.  He is adamant that he
has been taking care of his diabetes and his high blood pressure.

However, he continues to smoke up to a pack a day.

He was strongly advised to refrain and to discontinue smoking, as
this would worsen his cerebrovascular disease with increased risk
of a new ischemic stroke.

He has also been advised to continue taking his aspirin and Plavix.
A follow-up arteriogram will be undertaken in a year's time from
today.  He has been asked to call for any new symptoms or signs
that he may experience.

## 2014-06-12 ENCOUNTER — Other Ambulatory Visit: Payer: Self-pay | Admitting: Internal Medicine

## 2014-06-20 ENCOUNTER — Encounter: Payer: Self-pay | Admitting: *Deleted

## 2014-06-20 NOTE — Progress Notes (Signed)
Edd Fabian and I called Krist[facility] Oppen[facility], [facility] and Dr Samuel Germany (Dr Debroah Baller with address in [place]) office. Any future benzo rx must come from Dr Loletta Specter as [facility] will not Rx any future benzo as an outpt. Pt informed of this with Edd Fabian as witness. FYI and PL updated. Dr Samuel Germany office informed of our position. PCP notified.

## 2014-06-20 NOTE — Progress Notes (Signed)
Pt stopped by clinic very upset and requesting refill on Clonazepam.  He states we sent him to [facility] and he sees a  Dr. Sol Blazing. He states Dr. Carlis Abbott will not fill his meds until she talks with Dr Ethelene Hal. Dr Lynnae January talked with pt and told him we would not refill this med for him, he needed to return to Dr. Carlis Abbott.   We did call [employer] on behalf of [name] and informed them we would not refill this med.

## 2014-06-23 ENCOUNTER — Emergency Department (HOSPITAL_COMMUNITY)
Admission: EM | Admit: 2014-06-23 | Discharge: 2014-06-23 | Disposition: A | Discharge status: Home / Self Care | Payer: Medicaid Other | Attending: Emergency Medicine | Admitting: Emergency Medicine

## 2014-06-23 ENCOUNTER — Emergency Department (HOSPITAL_COMMUNITY): Payer: Medicaid Other

## 2014-06-23 ENCOUNTER — Encounter (HOSPITAL_COMMUNITY): Payer: Self-pay | Admitting: Radiology

## 2014-06-23 DIAGNOSIS — G894 Chronic pain syndrome: Secondary | ICD-10-CM | POA: Diagnosis not present

## 2014-06-23 DIAGNOSIS — K219 Gastro-esophageal reflux disease without esophagitis: Secondary | ICD-10-CM | POA: Insufficient documentation

## 2014-06-23 DIAGNOSIS — F329 Major depressive disorder, single episode, unspecified: Secondary | ICD-10-CM | POA: Diagnosis not present

## 2014-06-23 DIAGNOSIS — S2231XA Fracture of one rib, right side, initial encounter for closed fracture: Secondary | ICD-10-CM

## 2014-06-23 DIAGNOSIS — E119 Type 2 diabetes mellitus without complications: Secondary | ICD-10-CM | POA: Diagnosis not present

## 2014-06-23 DIAGNOSIS — S2241XA Multiple fractures of ribs, right side, initial encounter for closed fracture: Secondary | ICD-10-CM | POA: Insufficient documentation

## 2014-06-23 DIAGNOSIS — W1839XA Other fall on same level, initial encounter: Secondary | ICD-10-CM | POA: Diagnosis not present

## 2014-06-23 DIAGNOSIS — Z7951 Long term (current) use of inhaled steroids: Secondary | ICD-10-CM | POA: Insufficient documentation

## 2014-06-23 DIAGNOSIS — F419 Anxiety disorder, unspecified: Secondary | ICD-10-CM | POA: Diagnosis not present

## 2014-06-23 DIAGNOSIS — I10 Essential (primary) hypertension: Secondary | ICD-10-CM | POA: Diagnosis not present

## 2014-06-23 DIAGNOSIS — Z86711 Personal history of pulmonary embolism: Secondary | ICD-10-CM | POA: Diagnosis not present

## 2014-06-23 DIAGNOSIS — Z87438 Personal history of other diseases of male genital organs: Secondary | ICD-10-CM | POA: Insufficient documentation

## 2014-06-23 DIAGNOSIS — Z79899 Other long term (current) drug therapy: Secondary | ICD-10-CM | POA: Diagnosis not present

## 2014-06-23 DIAGNOSIS — I509 Heart failure, unspecified: Secondary | ICD-10-CM | POA: Diagnosis not present

## 2014-06-23 DIAGNOSIS — Y998 Other external cause status: Secondary | ICD-10-CM | POA: Diagnosis not present

## 2014-06-23 DIAGNOSIS — Z792 Long term (current) use of antibiotics: Secondary | ICD-10-CM | POA: Insufficient documentation

## 2014-06-23 DIAGNOSIS — Y9301 Activity, walking, marching and hiking: Secondary | ICD-10-CM | POA: Insufficient documentation

## 2014-06-23 DIAGNOSIS — Z8619 Personal history of other infectious and parasitic diseases: Secondary | ICD-10-CM | POA: Diagnosis not present

## 2014-06-23 DIAGNOSIS — Z8669 Personal history of other diseases of the nervous system and sense organs: Secondary | ICD-10-CM | POA: Insufficient documentation

## 2014-06-23 DIAGNOSIS — Z87442 Personal history of urinary calculi: Secondary | ICD-10-CM | POA: Diagnosis not present

## 2014-06-23 DIAGNOSIS — Y9289 Other specified places as the place of occurrence of the external cause: Secondary | ICD-10-CM | POA: Diagnosis not present

## 2014-06-23 DIAGNOSIS — S299XXA Unspecified injury of thorax, initial encounter: Secondary | ICD-10-CM | POA: Diagnosis present

## 2014-06-23 DIAGNOSIS — J449 Chronic obstructive pulmonary disease, unspecified: Secondary | ICD-10-CM | POA: Diagnosis not present

## 2014-06-23 DIAGNOSIS — Z720 Tobacco use: Secondary | ICD-10-CM | POA: Insufficient documentation

## 2014-06-23 DIAGNOSIS — Z8673 Personal history of transient ischemic attack (TIA), and cerebral infarction without residual deficits: Secondary | ICD-10-CM | POA: Diagnosis not present

## 2014-06-23 DIAGNOSIS — M069 Rheumatoid arthritis, unspecified: Secondary | ICD-10-CM | POA: Insufficient documentation

## 2014-06-23 DIAGNOSIS — E785 Hyperlipidemia, unspecified: Secondary | ICD-10-CM | POA: Diagnosis not present

## 2014-06-23 DIAGNOSIS — W19XXXA Unspecified fall, initial encounter: Secondary | ICD-10-CM

## 2014-06-23 LAB — CBC WITH DIFFERENTIAL/PLATELET
BASOS ABS: 0 10*3/uL (ref 0.0–0.1)
Basophils Relative: 0 % (ref 0–1)
EOS PCT: 2 % (ref 0–5)
Eosinophils Absolute: 0.3 10*3/uL (ref 0.0–0.7)
HCT: 42.6 % (ref 39.0–52.0)
Hemoglobin: 14.3 g/dL (ref 13.0–17.0)
Lymphocytes Relative: 13 % (ref 12–46)
Lymphs Abs: 1.4 10*3/uL (ref 0.7–4.0)
MCH: 30.3 pg (ref 26.0–34.0)
MCHC: 33.6 g/dL (ref 30.0–36.0)
MCV: 90.3 fL (ref 78.0–100.0)
MONO ABS: 0.9 10*3/uL (ref 0.1–1.0)
Monocytes Relative: 8 % (ref 3–12)
NEUTROS ABS: 8.3 10*3/uL — AB (ref 1.7–7.7)
Neutrophils Relative %: 77 % (ref 43–77)
Platelets: 231 10*3/uL (ref 150–400)
RBC: 4.72 MIL/uL (ref 4.22–5.81)
RDW: 14.2 % (ref 11.5–15.5)
WBC: 10.9 10*3/uL — ABNORMAL HIGH (ref 4.0–10.5)

## 2014-06-23 LAB — COMPREHENSIVE METABOLIC PANEL
ALK PHOS: 82 U/L (ref 38–126)
ALT: 16 U/L — ABNORMAL LOW (ref 17–63)
ANION GAP: 9 (ref 5–15)
AST: 22 U/L (ref 15–41)
Albumin: 3.9 g/dL (ref 3.5–5.0)
BUN: 21 mg/dL — ABNORMAL HIGH (ref 6–20)
CALCIUM: 8.9 mg/dL (ref 8.9–10.3)
CHLORIDE: 104 mmol/L (ref 101–111)
CO2: 23 mmol/L (ref 22–32)
CREATININE: 0.91 mg/dL (ref 0.61–1.24)
GFR calc non Af Amer: >60 mL/min (ref >60)
GLUCOSE: 120 mg/dL — AB (ref 65–99)
POTASSIUM: 3.8 mmol/L (ref 3.5–5.1)
Sodium: 136 mmol/L (ref 135–145)
Total Bilirubin: 0.6 mg/dL (ref 0.3–1.2)
Total Protein: 7 g/dL (ref 6.5–8.1)

## 2014-06-23 MED ORDER — LIDOCAINE 5 % EX PTCH: 1.0000 | MEDICATED_PATCH | CUTANEOUS | Status: DC

## 2014-06-23 MED ORDER — LIDOCAINE 5 % EX PTCH
1.0000 | MEDICATED_PATCH | CUTANEOUS | Status: DC
  Administered 2014-06-23: 1 via TRANSDERMAL
  Filled 2014-06-23 (×2): qty 1

## 2014-06-23 MED ORDER — IOHEXOL 300 MG/ML  SOLN
100.0000 mL | Freq: Once | INTRAMUSCULAR | Status: AC | PRN
  Administered 2014-06-23: 100 mL via INTRAVENOUS

## 2014-06-23 MED ORDER — HYDROMORPHONE HCL 1 MG/ML IJ SOLN
1.0000 mg | Freq: Once | INTRAMUSCULAR | Status: AC
  Administered 2014-06-23: 1 mg via INTRAVENOUS
  Filled 2014-06-23: qty 1

## 2014-06-23 NOTE — ED Notes (Signed)
Pt reports that he fell two nights ago.  Reports that he did not hit his head, no LOC.  Pt reports that he went to the pain clinic and they gave him some medication that caused him to have a seizure.  Reports that he is on "blood thinners."

## 2014-06-23 NOTE — ED Notes (Signed)
Pt to CT
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
## 2014-06-23 NOTE — Discharge Instructions (Signed)
Rib Fracture °A rib fracture is a break or crack in one of the bones of the ribs. The ribs are a group of long, curved bones that wrap around your chest and attach to your spine. They protect your lungs and other organs in the chest cavity. A broken or cracked rib is often painful, but most do not cause other problems. Most rib fractures heal on their own over time. However, rib fractures can be more serious if multiple ribs are broken or if broken ribs move out of place and push against other structures. °CAUSES  °· A direct blow to the chest. For example, this could happen during contact sports, a car accident, or a fall against a hard object. °· Repetitive movements with high force, such as pitching a baseball or having severe coughing spells. °SYMPTOMS  °· Pain when you breathe in or cough. °· Pain when someone presses on the injured area. °DIAGNOSIS  °Your caregiver will perform a physical exam. Various imaging tests may be ordered to confirm the diagnosis and to look for related injuries. These tests may include a chest X-ray, computed tomography (CT), magnetic resonance imaging (MRI), or a bone scan. °TREATMENT  °Rib fractures usually heal on their own in 1-3 months. The longer healing period is often associated with a continued cough or other aggravating activities. During the healing period, pain control is very important. Medication is usually given to control pain. Hospitalization or surgery may be needed for more severe injuries, such as those in which multiple ribs are broken or the ribs have moved out of place.  °HOME CARE INSTRUCTIONS  °· Avoid strenuous activity and any activities or movements that cause pain. Be careful during activities and avoid bumping the injured rib. °· Gradually increase activity as directed by your caregiver. °· Only take over-the-counter or prescription medications as directed by your caregiver. Do not take other medications without asking your caregiver first. °· Apply ice  to the injured area for the first 1-2 days after you have been treated or as directed by your caregiver. Applying ice helps to reduce inflammation and pain. °¨ Put ice in a plastic bag. °¨ Place a towel between your skin and the bag.   °¨ Leave the ice on for 15-20 minutes at a time, every 2 hours while you are awake. °· Perform deep breathing as directed by your caregiver. This will help prevent pneumonia, which is a common complication of a broken rib. Your caregiver may instruct you to: °¨ Take deep breaths several times a day. °¨ Try to cough several times a day, holding a pillow against the injured area. °¨ Use a device called an incentive spirometer to practice deep breathing several times a day. °· Drink enough fluids to keep your urine clear or pale yellow. This will help you avoid constipation.   °· Do not wear a rib belt or binder. These restrict breathing, which can lead to pneumonia.   °SEEK IMMEDIATE MEDICAL CARE IF:  °· You have a fever.   °· You have difficulty breathing or shortness of breath.   °· You develop a continual cough, or you cough up thick or bloody sputum. °· You feel sick to your stomach (nausea), throw up (vomit), or have abdominal pain.   °· You have worsening pain not controlled with medications.   °MAKE SURE YOU: °· Understand these instructions. °· Will watch your condition. °· Will get help right away if you are not doing well or get worse. °Document Released: 12/29/2004 Document Revised: 08/31/2012 Document Reviewed:   03/02/2012 °[employer]® Patient Information ©2015 [employer]. This information is not intended to replace advice given to you by your health care provider. Make sure you discuss any questions you have with your health care provider. ° °

## 2014-06-23 NOTE — ED Notes (Signed)
Pt to xray

## 2014-06-27 ENCOUNTER — Emergency Department (HOSPITAL_COMMUNITY)
Admission: EM | Admit: 2014-06-27 | Discharge: 2014-06-27 | Disposition: A | Discharge status: Home / Self Care | Payer: Medicaid Other | Attending: Emergency Medicine | Admitting: Emergency Medicine

## 2014-06-27 ENCOUNTER — Encounter (HOSPITAL_COMMUNITY): Payer: Self-pay | Admitting: Emergency Medicine

## 2014-06-27 DIAGNOSIS — W01198D Fall on same level from slipping, tripping and stumbling with subsequent striking against other object, subsequent encounter: Secondary | ICD-10-CM | POA: Insufficient documentation

## 2014-06-27 DIAGNOSIS — Z8619 Personal history of other infectious and parasitic diseases: Secondary | ICD-10-CM | POA: Diagnosis not present

## 2014-06-27 DIAGNOSIS — S2241XD Multiple fractures of ribs, right side, subsequent encounter for fracture with routine healing: Secondary | ICD-10-CM | POA: Diagnosis present

## 2014-06-27 DIAGNOSIS — J45909 Unspecified asthma, uncomplicated: Secondary | ICD-10-CM | POA: Insufficient documentation

## 2014-06-27 DIAGNOSIS — Z720 Tobacco use: Secondary | ICD-10-CM | POA: Insufficient documentation

## 2014-06-27 DIAGNOSIS — J449 Chronic obstructive pulmonary disease, unspecified: Secondary | ICD-10-CM | POA: Diagnosis not present

## 2014-06-27 DIAGNOSIS — Z95810 Presence of automatic (implantable) cardiac defibrillator: Secondary | ICD-10-CM | POA: Diagnosis not present

## 2014-06-27 DIAGNOSIS — Z8673 Personal history of transient ischemic attack (TIA), and cerebral infarction without residual deficits: Secondary | ICD-10-CM | POA: Insufficient documentation

## 2014-06-27 DIAGNOSIS — F329 Major depressive disorder, single episode, unspecified: Secondary | ICD-10-CM | POA: Insufficient documentation

## 2014-06-27 DIAGNOSIS — E119 Type 2 diabetes mellitus without complications: Secondary | ICD-10-CM | POA: Diagnosis not present

## 2014-06-27 DIAGNOSIS — I509 Heart failure, unspecified: Secondary | ICD-10-CM | POA: Diagnosis not present

## 2014-06-27 DIAGNOSIS — Z8614 Personal history of Methicillin resistant Staphylococcus aureus infection: Secondary | ICD-10-CM | POA: Insufficient documentation

## 2014-06-27 DIAGNOSIS — Z792 Long term (current) use of antibiotics: Secondary | ICD-10-CM | POA: Diagnosis not present

## 2014-06-27 DIAGNOSIS — Z7951 Long term (current) use of inhaled steroids: Secondary | ICD-10-CM | POA: Diagnosis not present

## 2014-06-27 DIAGNOSIS — Z791 Long term (current) use of non-steroidal anti-inflammatories (NSAID): Secondary | ICD-10-CM | POA: Diagnosis not present

## 2014-06-27 DIAGNOSIS — Z86711 Personal history of pulmonary embolism: Secondary | ICD-10-CM | POA: Insufficient documentation

## 2014-06-27 DIAGNOSIS — I10 Essential (primary) hypertension: Secondary | ICD-10-CM | POA: Insufficient documentation

## 2014-06-27 DIAGNOSIS — Z79899 Other long term (current) drug therapy: Secondary | ICD-10-CM | POA: Diagnosis not present

## 2014-06-27 DIAGNOSIS — F410 Panic disorder [episodic paroxysmal anxiety] without agoraphobia: Secondary | ICD-10-CM | POA: Insufficient documentation

## 2014-06-27 DIAGNOSIS — Z87442 Personal history of urinary calculi: Secondary | ICD-10-CM | POA: Diagnosis not present

## 2014-06-27 DIAGNOSIS — G894 Chronic pain syndrome: Secondary | ICD-10-CM | POA: Insufficient documentation

## 2014-06-27 DIAGNOSIS — K219 Gastro-esophageal reflux disease without esophagitis: Secondary | ICD-10-CM | POA: Diagnosis not present

## 2014-06-27 DIAGNOSIS — R0781 Pleurodynia: Secondary | ICD-10-CM

## 2014-06-27 DIAGNOSIS — S2231XD Fracture of one rib, right side, subsequent encounter for fracture with routine healing: Secondary | ICD-10-CM

## 2014-06-27 DIAGNOSIS — E785 Hyperlipidemia, unspecified: Secondary | ICD-10-CM | POA: Diagnosis not present

## 2014-06-27 DIAGNOSIS — Z87438 Personal history of other diseases of male genital organs: Secondary | ICD-10-CM | POA: Diagnosis not present

## 2014-06-27 DIAGNOSIS — Z8701 Personal history of pneumonia (recurrent): Secondary | ICD-10-CM | POA: Diagnosis not present

## 2014-06-27 DIAGNOSIS — Z8611 Personal history of tuberculosis: Secondary | ICD-10-CM | POA: Insufficient documentation

## 2014-06-27 MED ORDER — OXYCODONE-ACETAMINOPHEN 5-325 MG PO TABS
1.0000 | ORAL_TABLET | Freq: Once | ORAL | Status: AC
  Administered 2014-06-27: 1 via ORAL
  Filled 2014-06-27: qty 1

## 2014-06-27 MED ORDER — OXYCODONE-ACETAMINOPHEN 5-325 MG PO TABS: 1.0000 | ORAL_TABLET | Freq: Four times a day (QID) | ORAL | Status: DC | PRN

## 2014-06-27 NOTE — ED Notes (Signed)
Pt presents for rib cage pain, seen at [facility] recently for same and diagnosed with rib fracture, productive cough with clear sputum.
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
## 2014-06-27 NOTE — Discharge Instructions (Signed)
Use ice to the area of pain to help with pain control. Use percocet as directed as needed for pain but don't drive while taking this medicine. It's important that you take deep breaths and cough every 2 hours to keep your lungs clear. Follow up with your regular doctor in 1 week for recheck of symptoms. Return to the ER for changes or worsening symptoms.   Chest Wall Pain Chest wall pain is pain felt in or around the chest bones and muscles. It may take up to 6 weeks to get better. It may take longer if you are active. Chest wall pain can happen on its own. Other times, things like germs, injury, coughing, or exercise can cause the pain. HOME CARE   Avoid activities that make you tired or cause pain. Try not to use your chest, belly (abdominal), or side muscles. Do not use heavy weights.  Put ice on the sore area.  Put ice in a plastic bag.  Place a towel between your skin and the bag.  Leave the ice on for 15-20 minutes for the first 2 days.  Only take medicine as told by your doctor. GET HELP RIGHT AWAY IF:   You have more pain or are very uncomfortable.  You have a fever.  Your chest pain gets worse.  You have new problems.  You feel sick to your stomach (nauseous) or throw up (vomit).  You start to sweat or feel lightheaded.  You have a cough with mucus (phlegm).  You cough up blood. MAKE SURE YOU:   Understand these instructions.  Will watch your condition.  Will get help right away if you are not doing well or get worse. Document Released: 06/17/2007 Document Revised: 03/23/2011 Document Reviewed: 08/25/2010 [facility] Patient Information 2015 [place], [place]. This information is not intended to replace advice given to you by your health care provider. Make sure you discuss any questions you have with your health care provider.

## 2014-06-28 ENCOUNTER — Other Ambulatory Visit: Payer: Self-pay | Admitting: *Deleted

## 2014-06-28 IMAGING — CR DG LUMBAR SPINE COMPLETE 4+V
5 series · 5 of 5 positions shown · non-contrast
Comparison: CT abdomen and pelvis 11/22/2011

CLINICAL DATA: Back pain and lower leg radiating pain.  Possible
back injury.  Pain and numbness to the right lower leg.

LUMBAR SPINE - COMPLETE 4+ VIEW

[t lumbar spine ap]
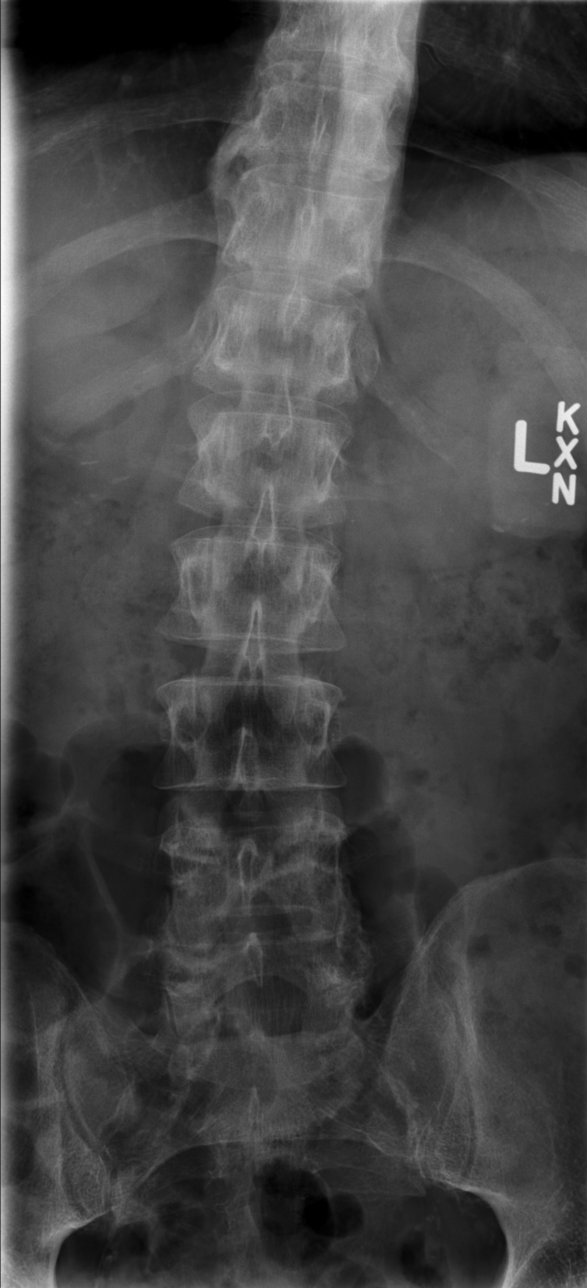

[t lumbar spine obl (1 of 2)]
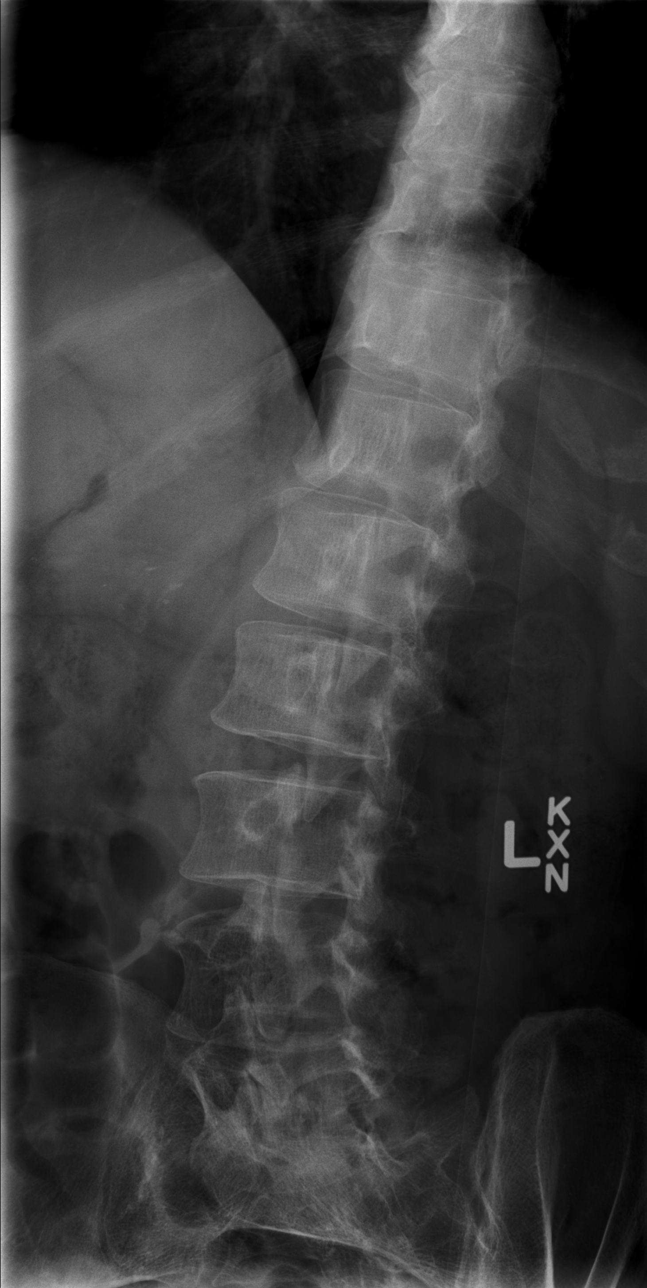

[t lumbar spine obl (2 of 2)]
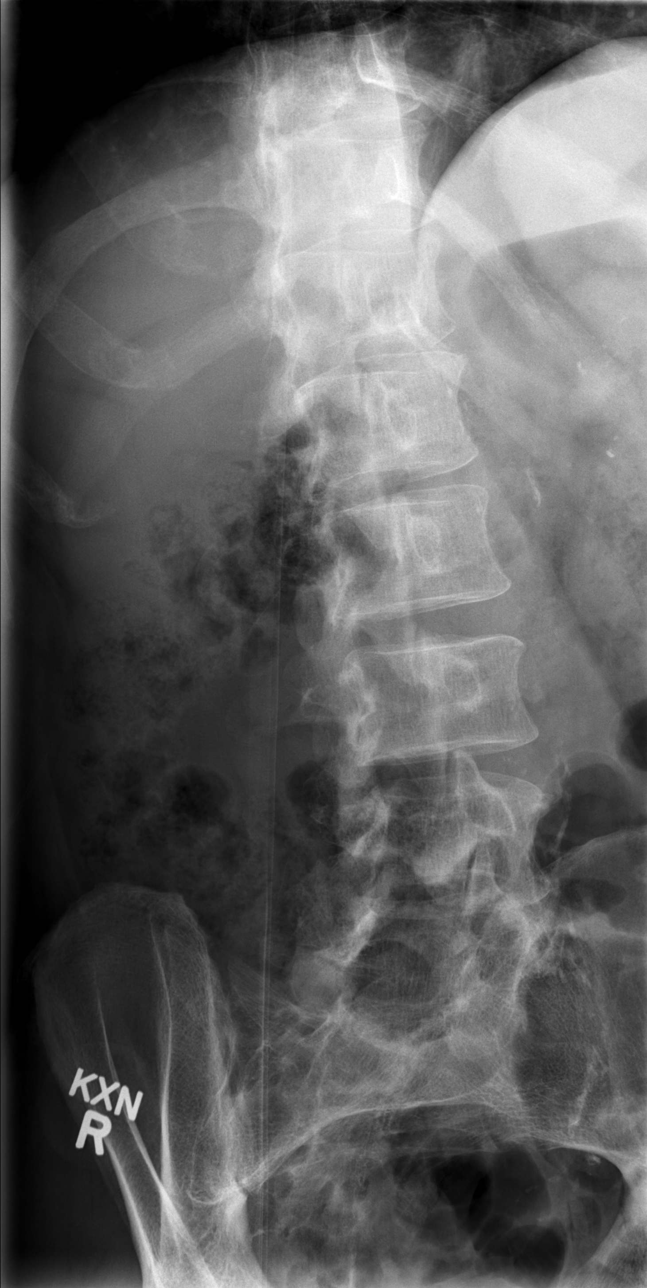

[t lumbar spine lat]
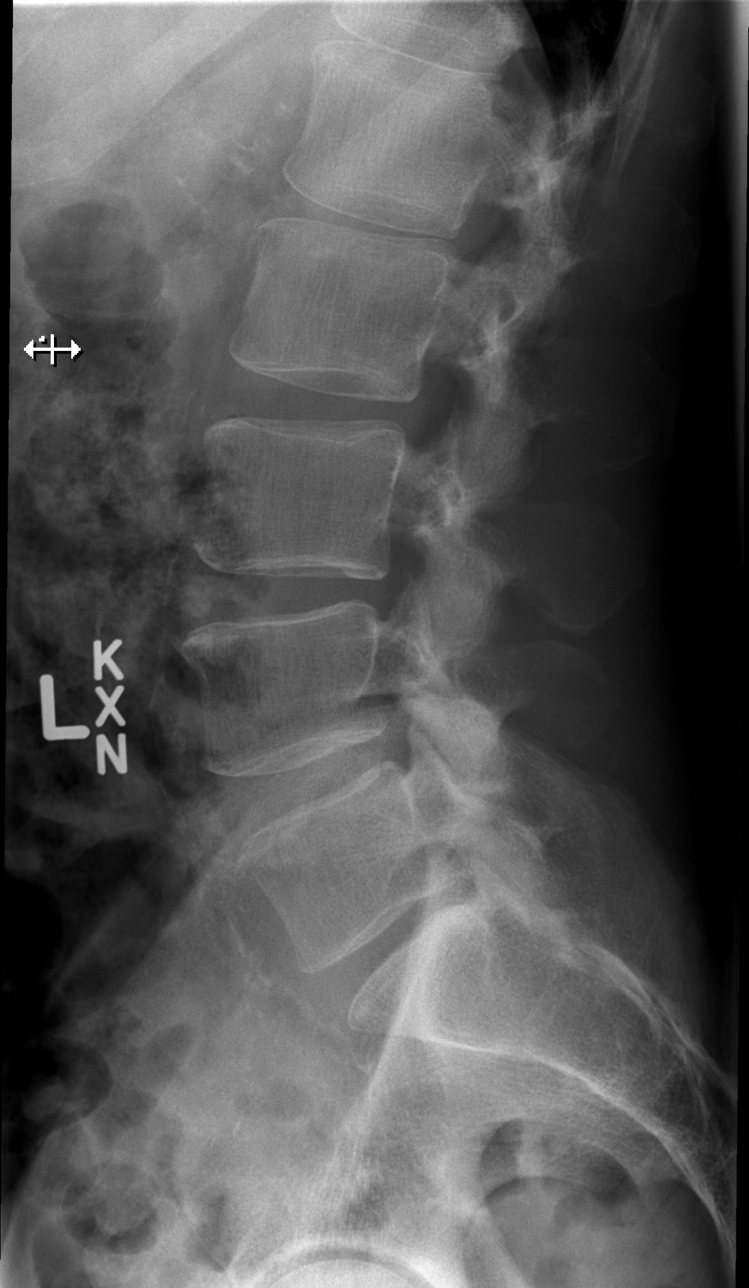

[t lumbar l-5 s-1 spot]
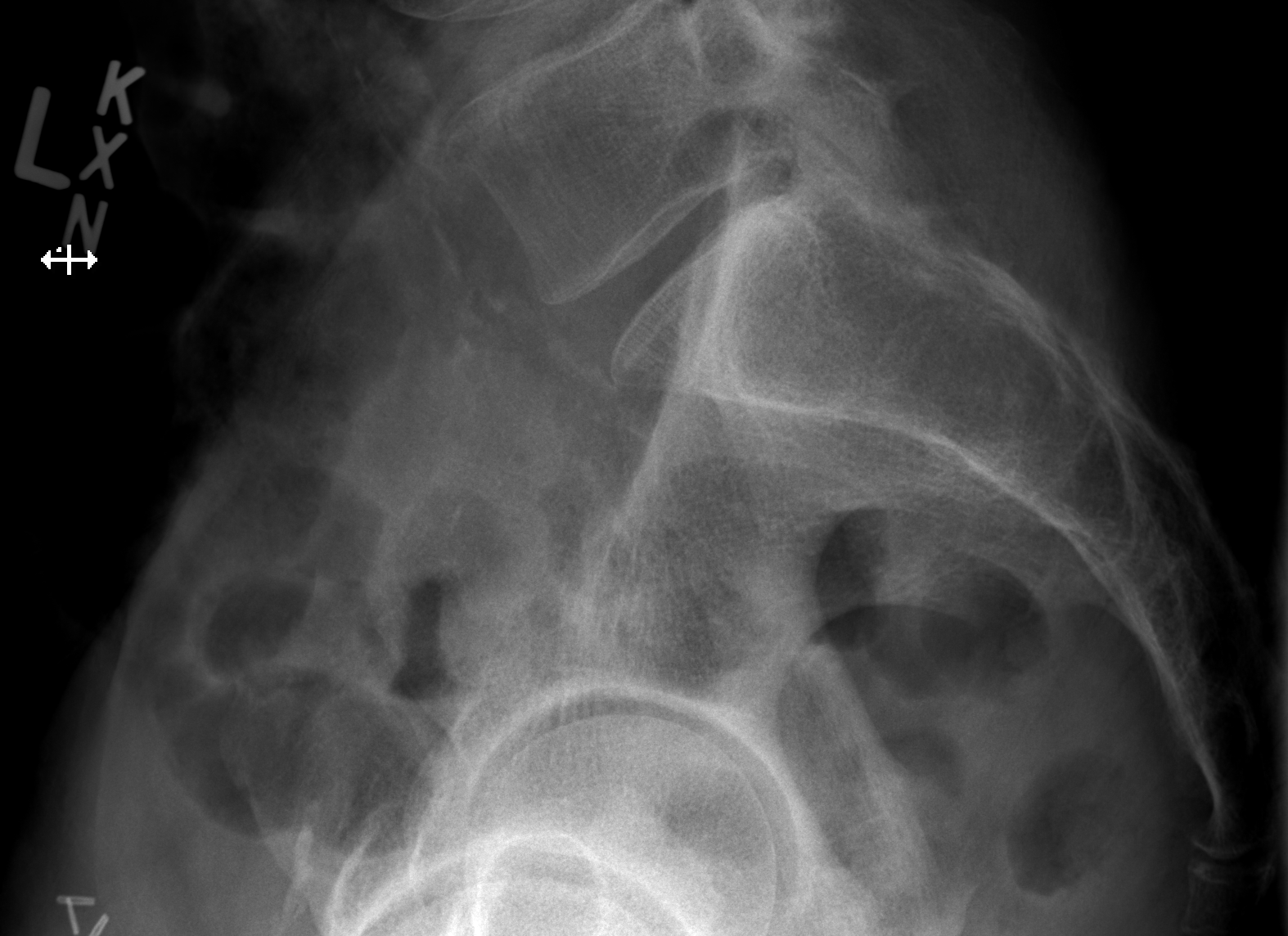

[5 of 5 positions shown; findings below may reference images not displayed]

FINDINGS: Five lumbar type vertebrae.  Curvature of the lumbar
spine, convex towards the right.  This may be positional.
Otherwise normal alignment of the lumbar vertebrae and facet
joints.  No vertebral compression deformities.  Intervertebral disc
space heights are preserved.  Minimal endplate hypertrophic changes
present.  Degenerative changes in the lower lumbar facet joints.
No focal bone lesion or bone destruction.  Bone cortex and
trabecular architecture appear intact.  Vascular calcifications.
IMPRESSION: No displaced fractures identified in the lumbar spine.

## 2014-06-29 ENCOUNTER — Encounter (HOSPITAL_COMMUNITY): Payer: Self-pay | Admitting: Physical Medicine and Rehabilitation

## 2014-06-29 ENCOUNTER — Emergency Department (HOSPITAL_COMMUNITY)
Admission: EM | Admit: 2014-06-29 | Discharge: 2014-06-29 | Disposition: A | Discharge status: Home / Self Care | Payer: Medicaid Other | Attending: Emergency Medicine | Admitting: Emergency Medicine

## 2014-06-29 DIAGNOSIS — R079 Chest pain, unspecified: Secondary | ICD-10-CM | POA: Diagnosis present

## 2014-06-29 DIAGNOSIS — Z9181 History of falling: Secondary | ICD-10-CM | POA: Diagnosis not present

## 2014-06-29 DIAGNOSIS — Z79899 Other long term (current) drug therapy: Secondary | ICD-10-CM | POA: Insufficient documentation

## 2014-06-29 DIAGNOSIS — R05 Cough: Secondary | ICD-10-CM | POA: Diagnosis not present

## 2014-06-29 DIAGNOSIS — Z7901 Long term (current) use of anticoagulants: Secondary | ICD-10-CM | POA: Diagnosis not present

## 2014-06-29 DIAGNOSIS — Z8673 Personal history of transient ischemic attack (TIA), and cerebral infarction without residual deficits: Secondary | ICD-10-CM | POA: Diagnosis not present

## 2014-06-29 DIAGNOSIS — I10 Essential (primary) hypertension: Secondary | ICD-10-CM | POA: Insufficient documentation

## 2014-06-29 DIAGNOSIS — Z86711 Personal history of pulmonary embolism: Secondary | ICD-10-CM | POA: Diagnosis not present

## 2014-06-29 DIAGNOSIS — Z8781 Personal history of (healed) traumatic fracture: Secondary | ICD-10-CM | POA: Insufficient documentation

## 2014-06-29 DIAGNOSIS — R0781 Pleurodynia: Secondary | ICD-10-CM | POA: Insufficient documentation

## 2014-06-29 DIAGNOSIS — K219 Gastro-esophageal reflux disease without esophagitis: Secondary | ICD-10-CM | POA: Insufficient documentation

## 2014-06-29 DIAGNOSIS — M069 Rheumatoid arthritis, unspecified: Secondary | ICD-10-CM | POA: Insufficient documentation

## 2014-06-29 DIAGNOSIS — Z720 Tobacco use: Secondary | ICD-10-CM | POA: Diagnosis not present

## 2014-06-29 DIAGNOSIS — Z8701 Personal history of pneumonia (recurrent): Secondary | ICD-10-CM | POA: Diagnosis not present

## 2014-06-29 DIAGNOSIS — Z8614 Personal history of Methicillin resistant Staphylococcus aureus infection: Secondary | ICD-10-CM | POA: Insufficient documentation

## 2014-06-29 DIAGNOSIS — Z87448 Personal history of other diseases of urinary system: Secondary | ICD-10-CM | POA: Insufficient documentation

## 2014-06-29 DIAGNOSIS — F410 Panic disorder [episodic paroxysmal anxiety] without agoraphobia: Secondary | ICD-10-CM | POA: Diagnosis not present

## 2014-06-29 DIAGNOSIS — I509 Heart failure, unspecified: Secondary | ICD-10-CM | POA: Diagnosis not present

## 2014-06-29 DIAGNOSIS — J449 Chronic obstructive pulmonary disease, unspecified: Secondary | ICD-10-CM | POA: Diagnosis not present

## 2014-06-29 DIAGNOSIS — Z951 Presence of aortocoronary bypass graft: Secondary | ICD-10-CM | POA: Diagnosis not present

## 2014-06-29 DIAGNOSIS — Z8611 Personal history of tuberculosis: Secondary | ICD-10-CM | POA: Diagnosis not present

## 2014-06-29 DIAGNOSIS — Z8619 Personal history of other infectious and parasitic diseases: Secondary | ICD-10-CM | POA: Diagnosis not present

## 2014-06-29 DIAGNOSIS — G8911 Acute pain due to trauma: Secondary | ICD-10-CM | POA: Insufficient documentation

## 2014-06-29 DIAGNOSIS — F329 Major depressive disorder, single episode, unspecified: Secondary | ICD-10-CM | POA: Insufficient documentation

## 2014-06-29 DIAGNOSIS — G894 Chronic pain syndrome: Secondary | ICD-10-CM | POA: Diagnosis not present

## 2014-06-29 DIAGNOSIS — Z95810 Presence of automatic (implantable) cardiac defibrillator: Secondary | ICD-10-CM | POA: Insufficient documentation

## 2014-06-29 DIAGNOSIS — E785 Hyperlipidemia, unspecified: Secondary | ICD-10-CM | POA: Insufficient documentation

## 2014-06-29 DIAGNOSIS — E119 Type 2 diabetes mellitus without complications: Secondary | ICD-10-CM | POA: Insufficient documentation

## 2014-06-29 DIAGNOSIS — Z760 Encounter for issue of repeat prescription: Secondary | ICD-10-CM | POA: Insufficient documentation

## 2014-06-29 DIAGNOSIS — Z87442 Personal history of urinary calculi: Secondary | ICD-10-CM | POA: Insufficient documentation

## 2014-06-29 DIAGNOSIS — Z792 Long term (current) use of antibiotics: Secondary | ICD-10-CM | POA: Insufficient documentation

## 2014-06-29 DIAGNOSIS — Z8669 Personal history of other diseases of the nervous system and sense organs: Secondary | ICD-10-CM | POA: Insufficient documentation

## 2014-06-29 DIAGNOSIS — Z7951 Long term (current) use of inhaled steroids: Secondary | ICD-10-CM | POA: Insufficient documentation

## 2014-06-29 MED ORDER — OXYCODONE-ACETAMINOPHEN 5-325 MG PO TABS
2.0000 | ORAL_TABLET | Freq: Once | ORAL | Status: AC
  Administered 2014-06-29: 2 via ORAL
  Filled 2014-06-29: qty 2

## 2014-06-29 MED ORDER — POLYETHYLENE GLYCOL 3350 17 G PO PACK: 17.0000 g | PACK | Freq: Every day | ORAL | Status: DC

## 2014-06-29 MED ORDER — TROLAMINE SALICYLATE 10 % EX CREA: 1.0000 "application " | TOPICAL_CREAM | CUTANEOUS | Status: DC | PRN
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
## 2014-06-29 NOTE — Discharge Instructions (Signed)
Please follow up with your pain specialist for further management of your ribs pain from broken ribs.  Take Miralax to help with constipation  Rib Fracture A rib fracture is a break or crack in one of the bones of the ribs. The ribs are a group of long, curved bones that wrap around your chest and attach to your spine. They protect your lungs and other organs in the chest cavity. A broken or cracked rib is often painful, but most do not cause other problems. Most rib fractures heal on their own over time. However, rib fractures can be more serious if multiple ribs are broken or if broken ribs move out of place and push against other structures. CAUSES   A direct blow to the chest. For example, this could happen during contact sports, a car accident, or a fall against a hard object.  Repetitive movements with high force, such as pitching a baseball or having severe coughing spells. SYMPTOMS   Pain when you breathe in or cough.  Pain when someone presses on the injured area. DIAGNOSIS  Your caregiver will perform a physical exam. Various imaging tests may be ordered to confirm the diagnosis and to look for related injuries. These tests may include a chest X-ray, computed tomography (CT), magnetic resonance imaging (MRI), or a bone scan. TREATMENT  Rib fractures usually heal on their own in 1-3 months. The longer healing period is often associated with a continued cough or other aggravating activities. During the healing period, pain control is very important. Medication is usually given to control pain. Hospitalization or surgery may be needed for more severe injuries, such as those in which multiple ribs are broken or the ribs have moved out of place.  HOME CARE INSTRUCTIONS   Avoid strenuous activity and any activities or movements that cause pain. Be careful during activities and avoid bumping the injured rib.  Gradually increase activity as directed by your caregiver.  Only take  over-the-counter or prescription medications as directed by your caregiver. Do not take other medications without asking your caregiver first.  Apply ice to the injured area for the first 1-2 days after you have been treated or as directed by your caregiver. Applying ice helps to reduce inflammation and pain.  Put ice in a plastic bag.  Place a towel between your skin and the bag.   Leave the ice on for 15-20 minutes at a time, every 2 hours while you are awake.  Perform deep breathing as directed by your caregiver. This will help prevent pneumonia, which is a common complication of a broken rib. Your caregiver may instruct you to:  Take deep breaths several times a day.  Try to cough several times a day, holding a pillow against the injured area.  Use a device called an incentive spirometer to practice deep breathing several times a day.  Drink enough fluids to keep your urine clear or pale yellow. This will help you avoid constipation.   Do not wear a rib belt or binder. These restrict breathing, which can lead to pneumonia.  SEEK IMMEDIATE MEDICAL CARE IF:   You have a fever.   You have difficulty breathing or shortness of breath.   You develop a continual cough, or you cough up thick or bloody sputum.  You feel sick to your stomach (nausea), throw up (vomit), or have abdominal pain.   You have worsening pain not controlled with medications.  MAKE SURE YOU:  Understand these instructions.  Will watch  your condition.  Will get help right away if you are not doing well or get worse. Document Released: 12/29/2004 Document Revised: 08/31/2012 Document Reviewed: 03/02/2012 [facility] Patient Information 2015 [place], [place]. This information is not intended to replace advice given to you by your health care provider. Make sure you discuss any questions you have with your health care provider.

## 2014-06-29 NOTE — ED Notes (Signed)
Pt was recently seen for R sided rib fractures, reports he is unable to get prescriptions filled, here requesting different prescription. Reports R sided rib pain. Respirations unlabored.

## 2014-06-30 ENCOUNTER — Emergency Department (HOSPITAL_COMMUNITY)
Admission: EM | Admit: 2014-06-30 | Discharge: 2014-06-30 | Disposition: A | Discharge status: Home / Self Care | Payer: Medicaid Other | Attending: Emergency Medicine | Admitting: Emergency Medicine

## 2014-06-30 ENCOUNTER — Emergency Department (HOSPITAL_COMMUNITY): Payer: Medicaid Other

## 2014-06-30 ENCOUNTER — Encounter (HOSPITAL_COMMUNITY): Payer: Self-pay | Admitting: Emergency Medicine

## 2014-06-30 DIAGNOSIS — Z95810 Presence of automatic (implantable) cardiac defibrillator: Secondary | ICD-10-CM | POA: Diagnosis not present

## 2014-06-30 DIAGNOSIS — F329 Major depressive disorder, single episode, unspecified: Secondary | ICD-10-CM | POA: Diagnosis not present

## 2014-06-30 DIAGNOSIS — Z8673 Personal history of transient ischemic attack (TIA), and cerebral infarction without residual deficits: Secondary | ICD-10-CM | POA: Diagnosis not present

## 2014-06-30 DIAGNOSIS — Z87438 Personal history of other diseases of male genital organs: Secondary | ICD-10-CM | POA: Insufficient documentation

## 2014-06-30 DIAGNOSIS — R05 Cough: Secondary | ICD-10-CM | POA: Insufficient documentation

## 2014-06-30 DIAGNOSIS — F410 Panic disorder [episodic paroxysmal anxiety] without agoraphobia: Secondary | ICD-10-CM | POA: Diagnosis not present

## 2014-06-30 DIAGNOSIS — Z8701 Personal history of pneumonia (recurrent): Secondary | ICD-10-CM | POA: Insufficient documentation

## 2014-06-30 DIAGNOSIS — W1839XD Other fall on same level, subsequent encounter: Secondary | ICD-10-CM | POA: Diagnosis not present

## 2014-06-30 DIAGNOSIS — Z87442 Personal history of urinary calculi: Secondary | ICD-10-CM | POA: Insufficient documentation

## 2014-06-30 DIAGNOSIS — J449 Chronic obstructive pulmonary disease, unspecified: Secondary | ICD-10-CM | POA: Insufficient documentation

## 2014-06-30 DIAGNOSIS — S2241XD Multiple fractures of ribs, right side, subsequent encounter for fracture with routine healing: Secondary | ICD-10-CM | POA: Diagnosis present

## 2014-06-30 DIAGNOSIS — G894 Chronic pain syndrome: Secondary | ICD-10-CM | POA: Insufficient documentation

## 2014-06-30 DIAGNOSIS — Z79899 Other long term (current) drug therapy: Secondary | ICD-10-CM | POA: Insufficient documentation

## 2014-06-30 DIAGNOSIS — Z8619 Personal history of other infectious and parasitic diseases: Secondary | ICD-10-CM | POA: Diagnosis not present

## 2014-06-30 DIAGNOSIS — K219 Gastro-esophageal reflux disease without esophagitis: Secondary | ICD-10-CM | POA: Insufficient documentation

## 2014-06-30 DIAGNOSIS — Z8614 Personal history of Methicillin resistant Staphylococcus aureus infection: Secondary | ICD-10-CM | POA: Diagnosis not present

## 2014-06-30 DIAGNOSIS — E119 Type 2 diabetes mellitus without complications: Secondary | ICD-10-CM | POA: Diagnosis not present

## 2014-06-30 DIAGNOSIS — E785 Hyperlipidemia, unspecified: Secondary | ICD-10-CM | POA: Insufficient documentation

## 2014-06-30 DIAGNOSIS — I10 Essential (primary) hypertension: Secondary | ICD-10-CM | POA: Diagnosis not present

## 2014-06-30 DIAGNOSIS — I509 Heart failure, unspecified: Secondary | ICD-10-CM | POA: Diagnosis not present

## 2014-06-30 DIAGNOSIS — Z8611 Personal history of tuberculosis: Secondary | ICD-10-CM | POA: Diagnosis not present

## 2014-06-30 DIAGNOSIS — Z86711 Personal history of pulmonary embolism: Secondary | ICD-10-CM | POA: Diagnosis not present

## 2014-06-30 DIAGNOSIS — Z720 Tobacco use: Secondary | ICD-10-CM | POA: Diagnosis not present

## 2014-06-30 MED ORDER — OXYCODONE-ACETAMINOPHEN 5-325 MG PO TABS
2.0000 | ORAL_TABLET | Freq: Once | ORAL | Status: AC
  Administered 2014-06-30: 2 via ORAL
  Filled 2014-06-30: qty 2

## 2014-06-30 MED ORDER — KETOROLAC TROMETHAMINE 60 MG/2ML IM SOLN
60.0000 mg | Freq: Once | INTRAMUSCULAR | Status: AC
  Administered 2014-06-30: 60 mg via INTRAMUSCULAR
  Filled 2014-06-30: qty 2

## 2014-06-30 MED ORDER — LEVETIRACETAM 500 MG PO TABS: 500.0000 mg | ORAL_TABLET | Freq: Two times a day (BID) | ORAL | Status: DC

## 2014-06-30 NOTE — Discharge Instructions (Signed)
It was recommended you get chest x-rays, however you declined these today. If your symptoms worsen please return to the ER. Follow-up with her primary care doctor.   Rib Fracture A rib fracture is a break or crack in one of the bones of the ribs. The ribs are a group of long, curved bones that wrap around your chest and attach to your spine. They protect your lungs and other organs in the chest cavity. A broken or cracked rib is often painful, but most do not cause other problems. Most rib fractures heal on their own over time. However, rib fractures can be more serious if multiple ribs are broken or if broken ribs move out of place and push against other structures. CAUSES   A direct blow to the chest. For example, this could happen during contact sports, a car accident, or a fall against a hard object.  Repetitive movements with high force, such as pitching a baseball or having severe coughing spells. SYMPTOMS   Pain when you breathe in or cough.  Pain when someone presses on the injured area. DIAGNOSIS  Your caregiver will perform a physical exam. Various imaging tests may be ordered to confirm the diagnosis and to look for related injuries. These tests may include a chest X-ray, computed tomography (CT), magnetic resonance imaging (MRI), or a bone scan. TREATMENT  Rib fractures usually heal on their own in 1-3 months. The longer healing period is often associated with a continued cough or other aggravating activities. During the healing period, pain control is very important. Medication is usually given to control pain. Hospitalization or surgery may be needed for more severe injuries, such as those in which multiple ribs are broken or the ribs have moved out of place.  HOME CARE INSTRUCTIONS   Avoid strenuous activity and any activities or movements that cause pain. Be careful during activities and avoid bumping the injured rib.  Gradually increase activity as directed by your  caregiver.  Only take over-the-counter or prescription medications as directed by your caregiver. Do not take other medications without asking your caregiver first.  Apply ice to the injured area for the first 1-2 days after you have been treated or as directed by your caregiver. Applying ice helps to reduce inflammation and pain.  Put ice in a plastic bag.  Place a towel between your skin and the bag.   Leave the ice on for 15-20 minutes at a time, every 2 hours while you are awake.  Perform deep breathing as directed by your caregiver. This will help prevent pneumonia, which is a common complication of a broken rib. Your caregiver may instruct you to:  Take deep breaths several times a day.  Try to cough several times a day, holding a pillow against the injured area.  Use a device called an incentive spirometer to practice deep breathing several times a day.  Drink enough fluids to keep your urine clear or pale yellow. This will help you avoid constipation.   Do not wear a rib belt or binder. These restrict breathing, which can lead to pneumonia.  SEEK IMMEDIATE MEDICAL CARE IF:   You have a fever.   You have difficulty breathing or shortness of breath.   You develop a continual cough, or you cough up thick or bloody sputum.  You feel sick to your stomach (nausea), throw up (vomit), or have abdominal pain.   You have worsening pain not controlled with medications.  MAKE SURE YOU:  Understand these  instructions.  Will watch your condition.  Will get help right away if you are not doing well or get worse. Document Released: 12/29/2004 Document Revised: 08/31/2012 Document Reviewed: 03/02/2012 [facility] Patient Information 2015 [place], [place]. This information is not intended to replace advice given to you by your health care provider. Make sure you discuss any questions you have with your health care provider.
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
## 2014-06-30 NOTE — ED Notes (Addendum)
Pt complaining of right rib pain after falling last week and breaking 3 ribs. Pt was seen here yesterday, Per EMS saying that his pain today began after the pain medication we gave him yesterday wore off.  Pt presents with pain and a barking cough

## 2014-07-01 ENCOUNTER — Emergency Department (HOSPITAL_COMMUNITY)
Admission: EM | Admit: 2014-07-01 | Discharge: 2014-07-01 | Disposition: A | Discharge status: Home / Self Care | Payer: Medicaid Other | Attending: Emergency Medicine | Admitting: Emergency Medicine

## 2014-07-01 ENCOUNTER — Encounter (HOSPITAL_COMMUNITY): Payer: Self-pay | Admitting: Nurse Practitioner

## 2014-07-01 ENCOUNTER — Emergency Department (HOSPITAL_COMMUNITY)
Admission: EM | Admit: 2014-07-01 | Discharge: 2014-07-01 | Discharge status: Against Medical Advice | Payer: Medicaid Other | Source: Home / Self Care

## 2014-07-01 ENCOUNTER — Encounter (HOSPITAL_COMMUNITY): Payer: Self-pay | Admitting: Emergency Medicine

## 2014-07-01 ENCOUNTER — Emergency Department (HOSPITAL_COMMUNITY): Payer: Medicaid Other

## 2014-07-01 ENCOUNTER — Telehealth: Payer: Self-pay | Admitting: Internal Medicine

## 2014-07-01 DIAGNOSIS — G894 Chronic pain syndrome: Secondary | ICD-10-CM | POA: Insufficient documentation

## 2014-07-01 DIAGNOSIS — Z8614 Personal history of Methicillin resistant Staphylococcus aureus infection: Secondary | ICD-10-CM | POA: Diagnosis not present

## 2014-07-01 DIAGNOSIS — E785 Hyperlipidemia, unspecified: Secondary | ICD-10-CM | POA: Insufficient documentation

## 2014-07-01 DIAGNOSIS — Z8673 Personal history of transient ischemic attack (TIA), and cerebral infarction without residual deficits: Secondary | ICD-10-CM | POA: Insufficient documentation

## 2014-07-01 DIAGNOSIS — E119 Type 2 diabetes mellitus without complications: Secondary | ICD-10-CM

## 2014-07-01 DIAGNOSIS — R079 Chest pain, unspecified: Secondary | ICD-10-CM

## 2014-07-01 DIAGNOSIS — I509 Heart failure, unspecified: Secondary | ICD-10-CM | POA: Insufficient documentation

## 2014-07-01 DIAGNOSIS — R0789 Other chest pain: Secondary | ICD-10-CM | POA: Diagnosis not present

## 2014-07-01 DIAGNOSIS — I10 Essential (primary) hypertension: Secondary | ICD-10-CM | POA: Insufficient documentation

## 2014-07-01 DIAGNOSIS — Z720 Tobacco use: Secondary | ICD-10-CM

## 2014-07-01 DIAGNOSIS — F329 Major depressive disorder, single episode, unspecified: Secondary | ICD-10-CM | POA: Diagnosis not present

## 2014-07-01 DIAGNOSIS — K219 Gastro-esophageal reflux disease without esophagitis: Secondary | ICD-10-CM | POA: Diagnosis not present

## 2014-07-01 DIAGNOSIS — Z8611 Personal history of tuberculosis: Secondary | ICD-10-CM | POA: Diagnosis not present

## 2014-07-01 DIAGNOSIS — R0781 Pleurodynia: Secondary | ICD-10-CM | POA: Insufficient documentation

## 2014-07-01 DIAGNOSIS — K625 Hemorrhage of anus and rectum: Secondary | ICD-10-CM | POA: Insufficient documentation

## 2014-07-01 DIAGNOSIS — Z79899 Other long term (current) drug therapy: Secondary | ICD-10-CM | POA: Diagnosis not present

## 2014-07-01 DIAGNOSIS — Z86711 Personal history of pulmonary embolism: Secondary | ICD-10-CM | POA: Diagnosis not present

## 2014-07-01 DIAGNOSIS — J449 Chronic obstructive pulmonary disease, unspecified: Secondary | ICD-10-CM | POA: Insufficient documentation

## 2014-07-01 DIAGNOSIS — M1990 Unspecified osteoarthritis, unspecified site: Secondary | ICD-10-CM | POA: Diagnosis not present

## 2014-07-01 DIAGNOSIS — Z8701 Personal history of pneumonia (recurrent): Secondary | ICD-10-CM | POA: Diagnosis not present

## 2014-07-01 DIAGNOSIS — Z87442 Personal history of urinary calculi: Secondary | ICD-10-CM | POA: Insufficient documentation

## 2014-07-01 LAB — TYPE AND SCREEN
ABO/RH(D): A POS
Antibody Screen: NEGATIVE

## 2014-07-01 LAB — CBC
HCT: 38.8 % — ABNORMAL LOW (ref 39.0–52.0)
Hemoglobin: 12.9 g/dL — ABNORMAL LOW (ref 13.0–17.0)
MCH: 29.5 pg (ref 26.0–34.0)
MCHC: 33.2 g/dL (ref 30.0–36.0)
MCV: 88.6 fL (ref 78.0–100.0)
Platelets: 263 10*3/uL (ref 150–400)
RBC: 4.38 MIL/uL (ref 4.22–5.81)
RDW: 13.8 % (ref 11.5–15.5)
WBC: 6.7 10*3/uL (ref 4.0–10.5)

## 2014-07-01 LAB — COMPREHENSIVE METABOLIC PANEL
ALBUMIN: 3.4 g/dL — AB (ref 3.5–5.0)
ALK PHOS: 81 U/L (ref 38–126)
ALT: 17 U/L (ref 17–63)
AST: 28 U/L (ref 15–41)
Anion gap: 7 (ref 5–15)
BUN: 19 mg/dL (ref 6–20)
CALCIUM: 8.9 mg/dL (ref 8.9–10.3)
CO2: 25 mmol/L (ref 22–32)
CREATININE: 0.95 mg/dL (ref 0.61–1.24)
Chloride: 109 mmol/L (ref 101–111)
GFR calc Af Amer: >60 mL/min (ref >60)
GFR calc non Af Amer: >60 mL/min (ref >60)
Glucose, Bld: 162 mg/dL — ABNORMAL HIGH (ref 65–99)
POTASSIUM: 3.9 mmol/L (ref 3.5–5.1)
Sodium: 141 mmol/L (ref 135–145)
Total Bilirubin: 0.2 mg/dL — ABNORMAL LOW (ref 0.3–1.2)
Total Protein: 7 g/dL (ref 6.5–8.1)

## 2014-07-01 LAB — PROTIME-INR
INR: 1.15 (ref 0.00–1.49)
Prothrombin Time: 14.9 seconds (ref 11.6–15.2)

## 2014-07-01 LAB — POC OCCULT BLOOD, ED: FECAL OCCULT BLD: NEGATIVE

## 2014-07-01 LAB — APTT: aPTT: 38 seconds — ABNORMAL HIGH (ref 24–37)

## 2014-07-01 MED ORDER — SODIUM CHLORIDE 0.9 % IV SOLN
1000.0000 mL | INTRAVENOUS | Status: DC
Start: 2014-07-01 — End: 2014-07-01

## 2014-07-01 NOTE — ED Notes (Signed)
Pt removed form system due to no answer when called

## 2014-07-01 NOTE — ED Notes (Signed)
Pt reports he was dx with broken ribs a week ago, states he was not given any pain meds to take home and has been coming here and going to [place] to get pain medication. Pt reports he is supposed to start at the pain clinic tmrw. Pt originally stated he was going to leave, this nurse talked to patient, advised that we cannot help him with his pain if he leaves and that he would be leaving against medical advice. Pt decided to stay and be treated, although he is refusing to have IV placed at this time. Pt denies sob or chest pain. Reports 5-6 bloody stools today

## 2014-07-01 NOTE — ED Notes (Signed)
Patient called to room x3, no answer, RN aware

## 2014-07-01 NOTE — ED Notes (Signed)
Patient called to room x2, no answer.  

## 2014-07-01 NOTE — ED Notes (Signed)
Pt arrived to the ED with a complaint of right sided rib pain.  Pt is also on Xarelto and recently has been having rectal bleeding.  Pt called his doctor who stated he needed to come to the ED to "get a shot of pain medicine and check out whether my blood medicine is making me bleed."  Pt states little blood in his stool but when wipes he has a lot of blood.

## 2014-07-01 NOTE — ED Notes (Signed)
Patient called to treatment room x1, no answer.  

## 2014-07-01 NOTE — Telephone Encounter (Signed)
   Reason for call:   I received a call from [name] at 3:50  AM indicating that he was seen earlier today (actually last night 6/18) in the ED due to pain from rib fractures, he reports that he returned home and had a bloody bowel movement.  This is the first time that has ever happened.  He denies any feelings of dizziness or near syncope.   Pertinent Data:   It appears [name] has been to the ED 4 times over the past week for rib pain after a fall and has been treated symptomatically and discharged.  He takes Xarelto for history of PE   Assessment / Plan / Recommendations:   I discussed with Mr [name] that it is difficult for me to evaluate him over the phone for a possible GI bleed.  He would unfortunately need to call EMS and return to the ED for this complaint.   [name] Groves, DO   07/01/2014, 3:49 AM

## 2014-07-01 NOTE — ED Notes (Signed)
Pt states he was leaving because he has to catch the bus. Refused to wait for EDP, further medications, and a last set of vitals. Pt made aware of risks and benefits of leaving AMA. States he is leaving any ways because he needs to catch the bus. Pt ambulatory without difficulty and in NAD.

## 2014-07-01 NOTE — ED Notes (Signed)
Dr. Tomi Bamberger aware that patient refusing iv and cardiac monitor and was attempting to leave AMA

## 2014-07-01 NOTE — ED Notes (Signed)
Pt reports he was diagnosed with rib fractures here recently and is having severe pain. States he was not given pain medication to take at home because he is under pain management contract. He has also noticed blood in his stool since 0530 this am, about 5-6 episodes.  He denies abd pain.

## 2014-07-01 NOTE — ED Notes (Signed)
Patient now refusing cardiac monitor and pulse ox. States "why cant yall just treat me and let go, there is nothing wrong with my heart."

## 2014-07-01 NOTE — ED Notes (Signed)
MD at bedside. 
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
## 2014-07-02 ENCOUNTER — Telehealth: Payer: Self-pay | Admitting: *Deleted

## 2014-07-02 ENCOUNTER — Encounter: Payer: Self-pay | Admitting: Internal Medicine

## 2014-07-02 ENCOUNTER — Ambulatory Visit (INDEPENDENT_AMBULATORY_CARE_PROVIDER_SITE_OTHER): Payer: Medicaid Other | Admitting: Internal Medicine

## 2014-07-02 VITALS — BP 154/93 | HR 85 | Temp 97.6°F | Ht 73.0 in | Wt 158.8 lb

## 2014-07-02 DIAGNOSIS — R0781 Pleurodynia: Secondary | ICD-10-CM | POA: Diagnosis not present

## 2014-07-02 DIAGNOSIS — K921 Melena: Secondary | ICD-10-CM | POA: Diagnosis not present

## 2014-07-02 LAB — POC HEMOCCULT BLD/STL (OFFICE/1-CARD/DIAGNOSTIC): FECAL OCCULT BLD: POSITIVE — AB

## 2014-07-02 MED ORDER — OXYCODONE-ACETAMINOPHEN 5-325 MG PO TABS: 1.0000 | ORAL_TABLET | Freq: Four times a day (QID) | ORAL | Status: DC | PRN

## 2014-07-02 NOTE — Progress Notes (Signed)
   Subjective:    Patient ID: [name], male    DOB: 07-27-1960, 54 y.o.   MRN: [id number]  HPI  54 yo male with PE on xarelto, DM II, brain abscess finished abx course, anxiety, hx of controlled substance abuse here for pain.  Went to ED on 6/11, 6/15, 6/17, 6/18, 6/19 for rib pain. 6/11 rib film shows nondisplaced fx of right rib angle of right 7th and 8th ribs. He stated he fell 1 week ago. He said he had "a seizure" but he stated he was awake, didn't have LOC, remembers the whole thing happening. So I am not sure whether it was seizure or not. He is complaint with his keppra per patient.   Was given 10 tabs of percocet on 6/15 which he ran out. Has tried ibuprofen 800mg  and voltaren gel as well without help. Asking for pain meds today.   He has been getting clonazepam 0.5mg  tabs from a physican in [place], [place]. Last filled 50 tabs on 06/21/2014.   Yesterday also complained  "dark  Stools" but FOBT was negative 1 day ago. CBC 12.9 yesterday. He was given the choice of admission to monitor his hct but he refused to stay. He did not take any xarelto last night but his dark stool was only one time then cleared up yesterday.   No other issues   Review of Systems  Constitutional: Negative for fever, chills and fatigue.  HENT: Negative for congestion and sore throat.   Eyes: Negative for photophobia and visual disturbance.  Respiratory: Negative for apnea, chest tightness and wheezing.   Gastrointestinal: Positive for blood in stool. Negative for nausea, vomiting, diarrhea, abdominal distention and rectal pain.  Endocrine: Negative for polyuria.  Genitourinary: Negative for dysuria and difficulty urinating.  Musculoskeletal:       Rib pain  Allergic/Immunologic: Negative.   Neurological: Negative for dizziness and light-headedness.  Hematological: Negative.   Psychiatric/Behavioral: Negative.        Objective:   Physical Exam  Constitutional: He is oriented to person,  place, and time.  Anxious appearing male.   HENT:  Head: Normocephalic and atraumatic.  Right Ear: External ear normal.  Left Ear: External ear normal.  Mouth/Throat: Oropharynx is clear and moist.  Eyes: EOM are normal. Pupils are equal, round, and reactive to light.  Neck: Normal range of motion.  Cardiovascular: Normal rate, regular rhythm and normal heart sounds.  Exam reveals no gallop and no friction rub.   No murmur heard. Pulmonary/Chest: Effort normal and breath sounds normal. No respiratory distress. He has no wheezes. He has no rales. He exhibits no tenderness.  Abdominal: Soft. Bowel sounds are normal. He exhibits no distension. There is no tenderness.  Genitourinary:  Rectal exam revealed no blood or lesions.   Musculoskeletal: Normal range of motion. He exhibits no edema.  Has tenderness to palpation on anterior lateral rib area on the right on around T10 level.   Neurological: He is alert and oriented to person, place, and time.  Skin: He is not diaphoretic.     Filed Vitals:   07/02/14 1347  BP: 154/93  Pulse: 85  Temp: 97.6 F (36.4 C)        Assessment & Plan:  See problem based a&p.

## 2014-07-02 NOTE — Telephone Encounter (Signed)
WALK-IN, pt states he is afraid he is going to die because of being on xarelto and a recent fall. Placed in slot at [place] dr Genene Churn

## 2014-07-02 NOTE — Patient Instructions (Addendum)
You should use Ice packs, voltaren gel, and ibuprofen for your pain.  We gave you a limited supply of percocet. We will not be prescribing any more in the future. The rib pain should get better in few days so you shouldn't need any more of percocet.   If you have any more rectal bleeding, you should go to the ED.  Please restart your xarelto today.

## 2014-07-02 NOTE — Assessment & Plan Note (Signed)
Noticed some bright red blood per rectum and also some dark stool yesterday. He went to the ED. FOBT negative at ED. hgb overall not changed, was 12.9 yesterday. He stated the bleeding cleared up yesterday. He did not take his dose of xarelto yesterday afraid of the bleeding.  On my exam today, he has no rectal bleeding or hemorrhoid. I did not check any CBC today as his stools cleared up yesterday and he had CBC checked yesterday. - repeated FOBT is positive.   Asked patient to resume Xarelto. Asked him to come to [place] or the ED if he has further bleeding.

## 2014-07-02 NOTE — Assessment & Plan Note (Addendum)
Rib pain 2/2 to right rib fx as seen on rib film. Continuing to have pain, went to ED several times within last week with this pain. Ran out of percocet #10 tabs that was given by the ED.  Asking for pain meds today. Has tried ibuprofen+voltaren gel.  Explained to him that his rib pain should be getting better by now and told him to try the voltaren gel and ice packs. Give him percocet 5-325mg  q6hr prn #15 tabs and told him this will be the last time we give him pain meds for this rib pain.  I am concerned about his hx of heroin abuse and medication abuse.   Asked him to come to the hospital immediately if he has more of his "seizure" like episodes which cause his fall and rib fx.

## 2014-07-04 NOTE — Progress Notes (Signed)
INTERNAL MEDICINE TEACHING ATTENDING ADDENDUM - Oneisha Ammons, MD: I reviewed and discussed at the time of visit with the resident Dr. Ahmed, the patient's medical history, physical examination, diagnosis and results of pertinent tests and treatment and I agree with the patient's care as documented.  

## 2014-07-07 ENCOUNTER — Encounter (HOSPITAL_COMMUNITY): Payer: Self-pay | Admitting: *Deleted

## 2014-07-07 ENCOUNTER — Emergency Department (HOSPITAL_COMMUNITY)
Admission: EM | Admit: 2014-07-07 | Discharge: 2014-07-07 | Disposition: A | Discharge status: Home / Self Care | Payer: Medicaid Other | Attending: Emergency Medicine | Admitting: Emergency Medicine

## 2014-07-07 DIAGNOSIS — Z8701 Personal history of pneumonia (recurrent): Secondary | ICD-10-CM | POA: Diagnosis not present

## 2014-07-07 DIAGNOSIS — J449 Chronic obstructive pulmonary disease, unspecified: Secondary | ICD-10-CM | POA: Diagnosis not present

## 2014-07-07 DIAGNOSIS — R0781 Pleurodynia: Secondary | ICD-10-CM | POA: Insufficient documentation

## 2014-07-07 DIAGNOSIS — Z8611 Personal history of tuberculosis: Secondary | ICD-10-CM | POA: Diagnosis not present

## 2014-07-07 DIAGNOSIS — Z8619 Personal history of other infectious and parasitic diseases: Secondary | ICD-10-CM | POA: Insufficient documentation

## 2014-07-07 DIAGNOSIS — Z8673 Personal history of transient ischemic attack (TIA), and cerebral infarction without residual deficits: Secondary | ICD-10-CM | POA: Diagnosis not present

## 2014-07-07 DIAGNOSIS — Z86711 Personal history of pulmonary embolism: Secondary | ICD-10-CM | POA: Diagnosis not present

## 2014-07-07 DIAGNOSIS — E785 Hyperlipidemia, unspecified: Secondary | ICD-10-CM | POA: Insufficient documentation

## 2014-07-07 DIAGNOSIS — K219 Gastro-esophageal reflux disease without esophagitis: Secondary | ICD-10-CM | POA: Insufficient documentation

## 2014-07-07 DIAGNOSIS — I509 Heart failure, unspecified: Secondary | ICD-10-CM | POA: Insufficient documentation

## 2014-07-07 DIAGNOSIS — Z720 Tobacco use: Secondary | ICD-10-CM | POA: Diagnosis not present

## 2014-07-07 DIAGNOSIS — Z79899 Other long term (current) drug therapy: Secondary | ICD-10-CM | POA: Diagnosis not present

## 2014-07-07 DIAGNOSIS — Z87442 Personal history of urinary calculi: Secondary | ICD-10-CM | POA: Insufficient documentation

## 2014-07-07 DIAGNOSIS — F329 Major depressive disorder, single episode, unspecified: Secondary | ICD-10-CM | POA: Diagnosis not present

## 2014-07-07 DIAGNOSIS — Z8614 Personal history of Methicillin resistant Staphylococcus aureus infection: Secondary | ICD-10-CM | POA: Insufficient documentation

## 2014-07-07 DIAGNOSIS — E119 Type 2 diabetes mellitus without complications: Secondary | ICD-10-CM | POA: Diagnosis not present

## 2014-07-07 DIAGNOSIS — G8929 Other chronic pain: Secondary | ICD-10-CM | POA: Diagnosis not present

## 2014-07-07 DIAGNOSIS — M069 Rheumatoid arthritis, unspecified: Secondary | ICD-10-CM | POA: Insufficient documentation

## 2014-07-07 DIAGNOSIS — J45909 Unspecified asthma, uncomplicated: Secondary | ICD-10-CM | POA: Diagnosis not present

## 2014-07-07 DIAGNOSIS — I10 Essential (primary) hypertension: Secondary | ICD-10-CM | POA: Insufficient documentation

## 2014-07-07 MED ORDER — OXYCODONE-ACETAMINOPHEN 5-325 MG PO TABS
2.0000 | ORAL_TABLET | Freq: Once | ORAL | Status: AC
  Administered 2014-07-07: 2 via ORAL
  Filled 2014-07-07: qty 2

## 2014-07-07 NOTE — Discharge Instructions (Signed)
Please read and follow all provided instructions.  Your diagnoses today include:  1. Rib pain on right side     Tests performed today include:  Vital signs. See below for your results today.   Medications prescribed:   None  Take any prescribed medications only as directed.  Home care instructions:  Follow any educational materials contained in this packet.  BE VERY CAREFUL not to take multiple medicines containing Tylenol (also called acetaminophen). Doing so can lead to an overdose which can damage your liver and cause liver failure and possibly death.   Follow-up instructions: Please follow-up with your primary care provider in the next 3 days for further evaluation of your symptoms.   Return instructions:   Please return to the Emergency Department if you experience worsening symptoms.   Return with shortness of breath, worsening cough, fever.   Please return if you have any other emergent concerns.  Additional Information:  Your vital signs today were: BP 150/83 mmHg   Pulse 87   Temp(Src) 97.9 F (36.6 C) (Oral)   Resp 18   SpO2 97% If your blood pressure (BP) was elevated above 135/85 this visit, please have this repeated by your doctor within one month. --------------
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
## 2014-07-07 NOTE — ED Notes (Signed)
Pt presents via POV for right rib pain.  Pt reports fracturing ribs 2-3 weeks ago.  Pt states the oxycodone prescribed is not helping his pain.  Pt denies SOB, chest expansion symmetrical, breath sounds equal.  Pt a x 4, NAD.

## 2014-07-11 ENCOUNTER — Telehealth: Payer: Self-pay | Admitting: *Deleted

## 2014-07-11 NOTE — Telephone Encounter (Signed)
Pt stated ED dr would not give him additional pain meds because his [facility] dr had said no more allowed. I asked pt if he remembered what Dr. Genene Churn had told him on his 07/02/14 visit when he was last seen in the [facility] and got the final Rx for pain medicine. Pt stated he did remember but "that other stuff does not work." Pt encouraged to try all measures, not just one- taking anti-inflammatory plus using gel. Pt insisted his pain required stronger meds. Pt reminded drs aware of his physical condition both here and in ED and their medical opinion was that the correct medication at this point were the OTC meds and gel and treatment Dr. Genene Churn had outlined. Pt stated "you are forcing me to go back to heroin." Pt reminded that his actions are always under his own control and he always had the choice whether or not to do what the physicians recommended for his health. His actions were his own choice and responsibility. We were here to support his safety and health. Dr. Dareen Piano notified about pt's complaints and advocated the above response by this RN. Yvonna Alanis, RN, 07/11/14, 2:30 PM

## 2014-07-19 ENCOUNTER — Other Ambulatory Visit: Payer: Self-pay | Admitting: Internal Medicine

## 2014-07-22 ENCOUNTER — Emergency Department (HOSPITAL_COMMUNITY): Payer: Medicaid Other

## 2014-07-22 ENCOUNTER — Inpatient Hospital Stay (HOSPITAL_COMMUNITY): Payer: Medicaid Other

## 2014-07-22 ENCOUNTER — Inpatient Hospital Stay (HOSPITAL_COMMUNITY)
Admission: EM | Admit: 2014-07-22 | Discharge: 2014-07-25 | DRG: 557 | Disposition: A | Discharge status: Home / Self Care | Payer: Medicaid Other | Attending: Internal Medicine | Admitting: Internal Medicine

## 2014-07-22 ENCOUNTER — Encounter (HOSPITAL_COMMUNITY): Payer: Self-pay | Admitting: Emergency Medicine

## 2014-07-22 DIAGNOSIS — F329 Major depressive disorder, single episode, unspecified: Secondary | ICD-10-CM | POA: Diagnosis present

## 2014-07-22 DIAGNOSIS — E785 Hyperlipidemia, unspecified: Secondary | ICD-10-CM | POA: Diagnosis present

## 2014-07-22 DIAGNOSIS — M25552 Pain in left hip: Secondary | ICD-10-CM | POA: Insufficient documentation

## 2014-07-22 DIAGNOSIS — Z95820 Peripheral vascular angioplasty status with implants and grafts: Secondary | ICD-10-CM

## 2014-07-22 DIAGNOSIS — W19XXXA Unspecified fall, initial encounter: Secondary | ICD-10-CM | POA: Insufficient documentation

## 2014-07-22 DIAGNOSIS — F1921 Other psychoactive substance dependence, in remission: Secondary | ICD-10-CM

## 2014-07-22 DIAGNOSIS — Z8679 Personal history of other diseases of the circulatory system: Secondary | ICD-10-CM

## 2014-07-22 DIAGNOSIS — F17210 Nicotine dependence, cigarettes, uncomplicated: Secondary | ICD-10-CM | POA: Diagnosis present

## 2014-07-22 DIAGNOSIS — Z87442 Personal history of urinary calculi: Secondary | ICD-10-CM

## 2014-07-22 DIAGNOSIS — B1920 Unspecified viral hepatitis C without hepatic coma: Secondary | ICD-10-CM | POA: Diagnosis present

## 2014-07-22 DIAGNOSIS — Z9181 History of falling: Secondary | ICD-10-CM

## 2014-07-22 DIAGNOSIS — E860 Dehydration: Secondary | ICD-10-CM | POA: Diagnosis not present

## 2014-07-22 DIAGNOSIS — Z981 Arthrodesis status: Secondary | ICD-10-CM

## 2014-07-22 DIAGNOSIS — R55 Syncope and collapse: Secondary | ICD-10-CM

## 2014-07-22 DIAGNOSIS — Z79899 Other long term (current) drug therapy: Secondary | ICD-10-CM

## 2014-07-22 DIAGNOSIS — I6529 Occlusion and stenosis of unspecified carotid artery: Secondary | ICD-10-CM | POA: Diagnosis present

## 2014-07-22 DIAGNOSIS — Z7951 Long term (current) use of inhaled steroids: Secondary | ICD-10-CM

## 2014-07-22 DIAGNOSIS — M6282 Rhabdomyolysis: Secondary | ICD-10-CM | POA: Diagnosis not present

## 2014-07-22 DIAGNOSIS — F32A Depression, unspecified: Secondary | ICD-10-CM | POA: Diagnosis present

## 2014-07-22 DIAGNOSIS — F410 Panic disorder [episodic paroxysmal anxiety] without agoraphobia: Secondary | ICD-10-CM | POA: Diagnosis present

## 2014-07-22 DIAGNOSIS — R748 Abnormal levels of other serum enzymes: Secondary | ICD-10-CM | POA: Diagnosis present

## 2014-07-22 DIAGNOSIS — R7989 Other specified abnormal findings of blood chemistry: Secondary | ICD-10-CM

## 2014-07-22 DIAGNOSIS — G894 Chronic pain syndrome: Secondary | ICD-10-CM | POA: Diagnosis present

## 2014-07-22 DIAGNOSIS — Z96662 Presence of left artificial ankle joint: Secondary | ICD-10-CM | POA: Diagnosis present

## 2014-07-22 DIAGNOSIS — I2699 Other pulmonary embolism without acute cor pulmonale: Secondary | ICD-10-CM

## 2014-07-22 DIAGNOSIS — Z95810 Presence of automatic (implantable) cardiac defibrillator: Secondary | ICD-10-CM

## 2014-07-22 DIAGNOSIS — J449 Chronic obstructive pulmonary disease, unspecified: Secondary | ICD-10-CM | POA: Diagnosis not present

## 2014-07-22 DIAGNOSIS — I10 Essential (primary) hypertension: Secondary | ICD-10-CM | POA: Diagnosis present

## 2014-07-22 DIAGNOSIS — R778 Other specified abnormalities of plasma proteins: Secondary | ICD-10-CM | POA: Insufficient documentation

## 2014-07-22 DIAGNOSIS — E119 Type 2 diabetes mellitus without complications: Secondary | ICD-10-CM | POA: Diagnosis present

## 2014-07-22 DIAGNOSIS — K449 Diaphragmatic hernia without obstruction or gangrene: Secondary | ICD-10-CM | POA: Diagnosis present

## 2014-07-22 DIAGNOSIS — M069 Rheumatoid arthritis, unspecified: Secondary | ICD-10-CM | POA: Diagnosis present

## 2014-07-22 DIAGNOSIS — Y92009 Unspecified place in unspecified non-institutional (private) residence as the place of occurrence of the external cause: Secondary | ICD-10-CM

## 2014-07-22 DIAGNOSIS — Z86711 Personal history of pulmonary embolism: Secondary | ICD-10-CM

## 2014-07-22 DIAGNOSIS — G060 Intracranial abscess and granuloma: Secondary | ICD-10-CM | POA: Diagnosis present

## 2014-07-22 DIAGNOSIS — K219 Gastro-esophageal reflux disease without esophagitis: Secondary | ICD-10-CM | POA: Diagnosis present

## 2014-07-22 DIAGNOSIS — Z8673 Personal history of transient ischemic attack (TIA), and cerebral infarction without residual deficits: Secondary | ICD-10-CM | POA: Diagnosis not present

## 2014-07-22 DIAGNOSIS — R569 Unspecified convulsions: Secondary | ICD-10-CM | POA: Diagnosis present

## 2014-07-22 DIAGNOSIS — I739 Peripheral vascular disease, unspecified: Secondary | ICD-10-CM | POA: Diagnosis present

## 2014-07-22 DIAGNOSIS — Z8661 Personal history of infections of the central nervous system: Secondary | ICD-10-CM

## 2014-07-22 DIAGNOSIS — Z7901 Long term (current) use of anticoagulants: Secondary | ICD-10-CM

## 2014-07-22 DIAGNOSIS — J45909 Unspecified asthma, uncomplicated: Secondary | ICD-10-CM | POA: Diagnosis present

## 2014-07-22 DIAGNOSIS — Z201 Contact with and (suspected) exposure to tuberculosis: Secondary | ICD-10-CM | POA: Diagnosis present

## 2014-07-22 DIAGNOSIS — Z9119 Patient's noncompliance with other medical treatment and regimen: Secondary | ICD-10-CM | POA: Diagnosis present

## 2014-07-22 DIAGNOSIS — G4733 Obstructive sleep apnea (adult) (pediatric): Secondary | ICD-10-CM | POA: Diagnosis present

## 2014-07-22 DIAGNOSIS — Z8614 Personal history of Methicillin resistant Staphylococcus aureus infection: Secondary | ICD-10-CM

## 2014-07-22 DIAGNOSIS — R821 Myoglobinuria: Secondary | ICD-10-CM | POA: Diagnosis present

## 2014-07-22 DIAGNOSIS — I639 Cerebral infarction, unspecified: Secondary | ICD-10-CM | POA: Diagnosis present

## 2014-07-22 LAB — CBC WITH DIFFERENTIAL/PLATELET
BASOS PCT: 0 % (ref 0–1)
Basophils Absolute: 0 10*3/uL (ref 0.0–0.1)
EOS PCT: 0 % (ref 0–5)
Eosinophils Absolute: 0 10*3/uL (ref 0.0–0.7)
HEMATOCRIT: 44.6 % (ref 39.0–52.0)
Hemoglobin: 15.1 g/dL (ref 13.0–17.0)
Lymphocytes Relative: 11 % — ABNORMAL LOW (ref 12–46)
Lymphs Abs: 1.4 10*3/uL (ref 0.7–4.0)
MCH: 30.3 pg (ref 26.0–34.0)
MCHC: 33.9 g/dL (ref 30.0–36.0)
MCV: 89.6 fL (ref 78.0–100.0)
MONO ABS: 1 10*3/uL (ref 0.1–1.0)
MONOS PCT: 8 % (ref 3–12)
NEUTROS PCT: 81 % — AB (ref 43–77)
Neutro Abs: 10.1 10*3/uL — ABNORMAL HIGH (ref 1.7–7.7)
Platelets: 185 10*3/uL (ref 150–400)
RBC: 4.98 MIL/uL (ref 4.22–5.81)
RDW: 14.5 % (ref 11.5–15.5)
WBC: 12.5 10*3/uL — ABNORMAL HIGH (ref 4.0–10.5)

## 2014-07-22 LAB — BASIC METABOLIC PANEL
ANION GAP: 8 (ref 5–15)
BUN: 33 mg/dL — ABNORMAL HIGH (ref 6–20)
CALCIUM: 8.8 mg/dL — AB (ref 8.9–10.3)
CO2: 25 mmol/L (ref 22–32)
Chloride: 104 mmol/L (ref 101–111)
Creatinine, Ser: 1.21 mg/dL (ref 0.61–1.24)
GFR calc non Af Amer: >60 mL/min (ref >60)
Glucose, Bld: 147 mg/dL — ABNORMAL HIGH (ref 65–99)
Potassium: 4.6 mmol/L (ref 3.5–5.1)
SODIUM: 137 mmol/L (ref 135–145)

## 2014-07-22 LAB — CK: CK TOTAL: 32031 U/L — AB (ref 49–397)

## 2014-07-22 LAB — TROPONIN I
Troponin I: 0.64 ng/mL (ref <0.031)
Troponin I: 0.76 ng/mL (ref <0.031)

## 2014-07-22 LAB — ETHANOL: Alcohol, Ethyl (B): <5 mg/dL (ref <5)

## 2014-07-22 LAB — MAGNESIUM: Magnesium: 2.1 mg/dL (ref 1.7–2.4)

## 2014-07-22 MED ORDER — HYDROMORPHONE HCL 1 MG/ML IJ SOLN
1.0000 mg | Freq: Once | INTRAMUSCULAR | Status: AC
  Administered 2014-07-22: 1 mg via INTRAVENOUS
  Filled 2014-07-22: qty 1

## 2014-07-22 MED ORDER — IPRATROPIUM-ALBUTEROL 0.5-2.5 (3) MG/3ML IN SOLN
3.0000 mL | Freq: Four times a day (QID) | RESPIRATORY_TRACT | Status: DC
  Administered 2014-07-22 – 2014-07-23 (×2): 3 mL via RESPIRATORY_TRACT
  Filled 2014-07-22 (×2): qty 3

## 2014-07-22 MED ORDER — GABAPENTIN 300 MG PO CAPS
600.0000 mg | ORAL_CAPSULE | Freq: Every day | ORAL | Status: DC
  Administered 2014-07-24: 600 mg via ORAL
  Filled 2014-07-22 (×4): qty 2

## 2014-07-22 MED ORDER — LEVETIRACETAM 500 MG PO TABS
500.0000 mg | ORAL_TABLET | Freq: Two times a day (BID) | ORAL | Status: DC
  Administered 2014-07-22 – 2014-07-25 (×6): 500 mg via ORAL
  Filled 2014-07-22 (×7): qty 1

## 2014-07-22 MED ORDER — SODIUM CHLORIDE 0.9 % IJ SOLN
3.0000 mL | Freq: Two times a day (BID) | INTRAMUSCULAR | Status: DC
  Administered 2014-07-23 – 2014-07-24 (×3): 3 mL via INTRAVENOUS

## 2014-07-22 MED ORDER — CAPSAICIN 0.025 % EX CREA
TOPICAL_CREAM | Freq: Two times a day (BID) | CUTANEOUS | Status: DC
  Administered 2014-07-22 – 2014-07-25 (×4): via TOPICAL
  Filled 2014-07-22: qty 56.6

## 2014-07-22 MED ORDER — AMOXICILLIN 875 MG PO TABS: 875.0000 mg | ORAL_TABLET | Freq: Two times a day (BID) | ORAL | Status: DC

## 2014-07-22 MED ORDER — SODIUM CHLORIDE 0.9 % IV BOLUS (SEPSIS)
1000.0000 mL | Freq: Once | INTRAVENOUS | Status: AC
  Administered 2014-07-22: 1000 mL via INTRAVENOUS

## 2014-07-22 MED ORDER — NICOTINE 21 MG/24HR TD PT24
21.0000 mg | MEDICATED_PATCH | TRANSDERMAL | Status: DC
  Administered 2014-07-22 – 2014-07-24 (×3): 21 mg via TRANSDERMAL
  Filled 2014-07-22 (×4): qty 1

## 2014-07-22 MED ORDER — AMOXICILLIN 250 MG/5ML PO SUSR
875.0000 mg | Freq: Two times a day (BID) | ORAL | Status: DC
  Administered 2014-07-22: 875 mg via ORAL
  Filled 2014-07-22 (×3): qty 20

## 2014-07-22 MED ORDER — SODIUM CHLORIDE 0.9 % IV SOLN
INTRAVENOUS | Status: AC
  Administered 2014-07-22 (×2): via INTRAVENOUS

## 2014-07-22 MED ORDER — MOMETASONE FURO-FORMOTEROL FUM 100-5 MCG/ACT IN AERO
2.0000 | INHALATION_SPRAY | Freq: Two times a day (BID) | RESPIRATORY_TRACT | Status: DC
  Administered 2014-07-22 – 2014-07-25 (×6): 2 via RESPIRATORY_TRACT
  Filled 2014-07-22: qty 8.8

## 2014-07-22 MED ORDER — OXYCODONE-ACETAMINOPHEN 5-325 MG PO TABS
1.0000 | ORAL_TABLET | Freq: Four times a day (QID) | ORAL | Status: DC | PRN
  Administered 2014-07-22 – 2014-07-23 (×3): 1 via ORAL
  Filled 2014-07-22 (×3): qty 1

## 2014-07-22 MED ORDER — OXYCODONE HCL 5 MG PO TABS
2.5000 mg | ORAL_TABLET | Freq: Four times a day (QID) | ORAL | Status: DC | PRN
  Administered 2014-07-22 – 2014-07-23 (×3): 2.5 mg via ORAL
  Filled 2014-07-22 (×3): qty 1

## 2014-07-22 MED ORDER — GABAPENTIN 600 MG PO TABS: 600.0000 mg | ORAL_TABLET | Freq: Every day | ORAL | Status: DC

## 2014-07-22 MED ORDER — ONDANSETRON HCL 4 MG/2ML IJ SOLN
4.0000 mg | Freq: Once | INTRAMUSCULAR | Status: AC
  Administered 2014-07-22: 4 mg via INTRAVENOUS
  Filled 2014-07-22: qty 2

## 2014-07-22 MED ORDER — OXYCODONE-ACETAMINOPHEN 7.5-325 MG PO TABS: 1.0000 | ORAL_TABLET | Freq: Four times a day (QID) | ORAL | Status: DC | PRN

## 2014-07-22 MED ORDER — RIVAROXABAN 20 MG PO TABS
20.0000 mg | ORAL_TABLET | Freq: Every day | ORAL | Status: DC
  Administered 2014-07-22 – 2014-07-24 (×3): 20 mg via ORAL
  Filled 2014-07-22 (×4): qty 1

## 2014-07-22 MED ORDER — PANTOPRAZOLE SODIUM 40 MG PO TBEC
40.0000 mg | DELAYED_RELEASE_TABLET | Freq: Every day | ORAL | Status: DC
  Administered 2014-07-23 – 2014-07-25 (×3): 40 mg via ORAL
  Filled 2014-07-22 (×3): qty 1

## 2014-07-22 MED ORDER — CYCLOBENZAPRINE HCL 5 MG PO TABS
7.5000 mg | ORAL_TABLET | Freq: Every day | ORAL | Status: DC
  Administered 2014-07-22 – 2014-07-24 (×3): 7.5 mg via ORAL
  Filled 2014-07-22 (×4): qty 1.5

## 2014-07-22 MED ORDER — SODIUM CHLORIDE 0.9 % IV BOLUS (SEPSIS)
1000.0000 mL | Freq: Once | INTRAVENOUS | Status: AC
Start: 2014-07-22 — End: 2014-07-22
  Administered 2014-07-22: 1000 mL via INTRAVENOUS

## 2014-07-22 NOTE — ED Notes (Signed)
Per EMS: pt sts woke up in bath tub this am then got up ate some breakfast and then still having left hip pain; pt asking for pain medication and nicotine patch; IV L FA 18g

## 2014-07-22 NOTE — Progress Notes (Signed)
Got report for E D nurse
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
## 2014-07-22 NOTE — Progress Notes (Signed)
received  Pt. From E D

## 2014-07-22 NOTE — ED Notes (Signed)
CRITICAL VALUE ALERT  Critical value received: Troponin .64  Date of notification:  07.10.16  Time of notification:  14:15  Critical value read back:yes  Nurse who received alert:  Renne Crigler RN   MD Notified: MD Doy Mince

## 2014-07-22 NOTE — ED Notes (Signed)
Admitting physicians at bedside.
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
## 2014-07-23 ENCOUNTER — Observation Stay (HOSPITAL_COMMUNITY): Payer: Medicaid Other

## 2014-07-23 DIAGNOSIS — J45909 Unspecified asthma, uncomplicated: Secondary | ICD-10-CM | POA: Diagnosis present

## 2014-07-23 DIAGNOSIS — R821 Myoglobinuria: Secondary | ICD-10-CM | POA: Diagnosis present

## 2014-07-23 DIAGNOSIS — Z87442 Personal history of urinary calculi: Secondary | ICD-10-CM | POA: Diagnosis not present

## 2014-07-23 DIAGNOSIS — Z7951 Long term (current) use of inhaled steroids: Secondary | ICD-10-CM | POA: Diagnosis not present

## 2014-07-23 DIAGNOSIS — K449 Diaphragmatic hernia without obstruction or gangrene: Secondary | ICD-10-CM | POA: Diagnosis present

## 2014-07-23 DIAGNOSIS — Z201 Contact with and (suspected) exposure to tuberculosis: Secondary | ICD-10-CM | POA: Diagnosis present

## 2014-07-23 DIAGNOSIS — I6529 Occlusion and stenosis of unspecified carotid artery: Secondary | ICD-10-CM | POA: Diagnosis present

## 2014-07-23 DIAGNOSIS — Z96662 Presence of left artificial ankle joint: Secondary | ICD-10-CM | POA: Diagnosis present

## 2014-07-23 DIAGNOSIS — Z9181 History of falling: Secondary | ICD-10-CM

## 2014-07-23 DIAGNOSIS — E785 Hyperlipidemia, unspecified: Secondary | ICD-10-CM | POA: Diagnosis present

## 2014-07-23 DIAGNOSIS — G894 Chronic pain syndrome: Secondary | ICD-10-CM | POA: Diagnosis present

## 2014-07-23 DIAGNOSIS — F410 Panic disorder [episodic paroxysmal anxiety] without agoraphobia: Secondary | ICD-10-CM | POA: Diagnosis present

## 2014-07-23 DIAGNOSIS — Z8673 Personal history of transient ischemic attack (TIA), and cerebral infarction without residual deficits: Secondary | ICD-10-CM | POA: Diagnosis not present

## 2014-07-23 DIAGNOSIS — Z79899 Other long term (current) drug therapy: Secondary | ICD-10-CM | POA: Diagnosis not present

## 2014-07-23 DIAGNOSIS — E119 Type 2 diabetes mellitus without complications: Secondary | ICD-10-CM | POA: Diagnosis present

## 2014-07-23 DIAGNOSIS — Z8614 Personal history of Methicillin resistant Staphylococcus aureus infection: Secondary | ICD-10-CM | POA: Diagnosis not present

## 2014-07-23 DIAGNOSIS — F329 Major depressive disorder, single episode, unspecified: Secondary | ICD-10-CM | POA: Diagnosis present

## 2014-07-23 DIAGNOSIS — R748 Abnormal levels of other serum enzymes: Secondary | ICD-10-CM | POA: Diagnosis present

## 2014-07-23 DIAGNOSIS — Z981 Arthrodesis status: Secondary | ICD-10-CM | POA: Diagnosis not present

## 2014-07-23 DIAGNOSIS — M069 Rheumatoid arthritis, unspecified: Secondary | ICD-10-CM | POA: Diagnosis present

## 2014-07-23 DIAGNOSIS — M6282 Rhabdomyolysis: Secondary | ICD-10-CM | POA: Diagnosis present

## 2014-07-23 DIAGNOSIS — Z7901 Long term (current) use of anticoagulants: Secondary | ICD-10-CM | POA: Diagnosis not present

## 2014-07-23 DIAGNOSIS — Z9119 Patient's noncompliance with other medical treatment and regimen: Secondary | ICD-10-CM | POA: Diagnosis present

## 2014-07-23 DIAGNOSIS — I10 Essential (primary) hypertension: Secondary | ICD-10-CM | POA: Diagnosis present

## 2014-07-23 DIAGNOSIS — G4733 Obstructive sleep apnea (adult) (pediatric): Secondary | ICD-10-CM | POA: Diagnosis present

## 2014-07-23 DIAGNOSIS — K219 Gastro-esophageal reflux disease without esophagitis: Secondary | ICD-10-CM | POA: Diagnosis present

## 2014-07-23 DIAGNOSIS — Z8661 Personal history of infections of the central nervous system: Secondary | ICD-10-CM | POA: Diagnosis not present

## 2014-07-23 DIAGNOSIS — J449 Chronic obstructive pulmonary disease, unspecified: Secondary | ICD-10-CM | POA: Diagnosis present

## 2014-07-23 DIAGNOSIS — M25552 Pain in left hip: Secondary | ICD-10-CM | POA: Diagnosis present

## 2014-07-23 DIAGNOSIS — I6340 Cerebral infarction due to embolism of unspecified cerebral artery: Secondary | ICD-10-CM | POA: Diagnosis not present

## 2014-07-23 DIAGNOSIS — I6789 Other cerebrovascular disease: Secondary | ICD-10-CM | POA: Diagnosis not present

## 2014-07-23 DIAGNOSIS — Z95820 Peripheral vascular angioplasty status with implants and grafts: Secondary | ICD-10-CM | POA: Diagnosis not present

## 2014-07-23 DIAGNOSIS — B1920 Unspecified viral hepatitis C without hepatic coma: Secondary | ICD-10-CM | POA: Diagnosis present

## 2014-07-23 DIAGNOSIS — R569 Unspecified convulsions: Secondary | ICD-10-CM | POA: Diagnosis present

## 2014-07-23 DIAGNOSIS — I739 Peripheral vascular disease, unspecified: Secondary | ICD-10-CM | POA: Diagnosis present

## 2014-07-23 DIAGNOSIS — I639 Cerebral infarction, unspecified: Secondary | ICD-10-CM | POA: Diagnosis present

## 2014-07-23 DIAGNOSIS — E860 Dehydration: Secondary | ICD-10-CM | POA: Diagnosis present

## 2014-07-23 DIAGNOSIS — Z86711 Personal history of pulmonary embolism: Secondary | ICD-10-CM | POA: Diagnosis not present

## 2014-07-23 DIAGNOSIS — F17210 Nicotine dependence, cigarettes, uncomplicated: Secondary | ICD-10-CM | POA: Diagnosis present

## 2014-07-23 DIAGNOSIS — Z95810 Presence of automatic (implantable) cardiac defibrillator: Secondary | ICD-10-CM | POA: Diagnosis not present

## 2014-07-23 LAB — CBC
HCT: 39.5 % (ref 39.0–52.0)
Hemoglobin: 13.2 g/dL (ref 13.0–17.0)
MCH: 30 pg (ref 26.0–34.0)
MCHC: 33.4 g/dL (ref 30.0–36.0)
MCV: 89.8 fL (ref 78.0–100.0)
Platelets: 147 10*3/uL — ABNORMAL LOW (ref 150–400)
RBC: 4.4 MIL/uL (ref 4.22–5.81)
RDW: 14.7 % (ref 11.5–15.5)
WBC: 7.4 10*3/uL (ref 4.0–10.5)

## 2014-07-23 LAB — BASIC METABOLIC PANEL
Anion gap: 6 (ref 5–15)
BUN: 14 mg/dL (ref 6–20)
CALCIUM: 8.2 mg/dL — AB (ref 8.9–10.3)
CO2: 22 mmol/L (ref 22–32)
Chloride: 108 mmol/L (ref 101–111)
Creatinine, Ser: 0.76 mg/dL (ref 0.61–1.24)
GFR calc Af Amer: >60 mL/min (ref >60)
GFR calc non Af Amer: >60 mL/min (ref >60)
Glucose, Bld: 112 mg/dL — ABNORMAL HIGH (ref 65–99)
Potassium: 4.4 mmol/L (ref 3.5–5.1)
Sodium: 136 mmol/L (ref 135–145)

## 2014-07-23 LAB — URINALYSIS, ROUTINE W REFLEX MICROSCOPIC
BILIRUBIN URINE: NEGATIVE
Glucose, UA: NEGATIVE mg/dL
Ketones, ur: NEGATIVE mg/dL
Leukocytes, UA: NEGATIVE
Nitrite: NEGATIVE
PROTEIN: 30 mg/dL — AB
Specific Gravity, Urine: 1.016 (ref 1.005–1.030)
Urobilinogen, UA: 0.2 mg/dL (ref 0.0–1.0)
pH: 5.5 (ref 5.0–8.0)

## 2014-07-23 LAB — GLUCOSE, CAPILLARY: GLUCOSE-CAPILLARY: 91 mg/dL (ref 65–99)

## 2014-07-23 LAB — RAPID URINE DRUG SCREEN, HOSP PERFORMED
Amphetamines: NOT DETECTED
BENZODIAZEPINES: NOT DETECTED
Barbiturates: NOT DETECTED
Cocaine: NOT DETECTED
Opiates: POSITIVE — AB
TETRAHYDROCANNABINOL: NOT DETECTED

## 2014-07-23 LAB — TROPONIN I
TROPONIN I: 0.54 ng/mL — AB (ref <0.031)
Troponin I: 0.36 ng/mL — ABNORMAL HIGH (ref <0.031)

## 2014-07-23 LAB — URINE MICROSCOPIC-ADD ON

## 2014-07-23 LAB — CK: Total CK: 12793 U/L — ABNORMAL HIGH (ref 49–397)

## 2014-07-23 MED ORDER — GADOBENATE DIMEGLUMINE 529 MG/ML IV SOLN
15.0000 mL | Freq: Once | INTRAVENOUS | Status: AC | PRN
  Administered 2014-07-23: 15 mL via INTRAVENOUS

## 2014-07-23 MED ORDER — IPRATROPIUM-ALBUTEROL 0.5-2.5 (3) MG/3ML IN SOLN
3.0000 mL | Freq: Four times a day (QID) | RESPIRATORY_TRACT | Status: DC | PRN
Start: 2014-07-23 — End: 2014-07-25

## 2014-07-23 MED ORDER — AMOXICILLIN-POT CLAVULANATE 400-57 MG/5ML PO SUSR
875.0000 mg | Freq: Two times a day (BID) | ORAL | Status: DC
  Administered 2014-07-23 – 2014-07-24 (×4): 875 mg via ORAL
  Filled 2014-07-23 (×7): qty 10.9

## 2014-07-23 MED ORDER — LORAZEPAM 1 MG PO TABS
1.0000 mg | ORAL_TABLET | Freq: Once | ORAL | Status: AC
Start: 2014-07-23 — End: 2014-07-23
  Administered 2014-07-23: 1 mg via ORAL
  Filled 2014-07-23: qty 1

## 2014-07-23 MED ORDER — OXYCODONE-ACETAMINOPHEN 5-325 MG PO TABS
1.0000 | ORAL_TABLET | ORAL | Status: DC | PRN
  Administered 2014-07-24 – 2014-07-25 (×7): 1 via ORAL
  Filled 2014-07-23 (×7): qty 1

## 2014-07-23 MED ORDER — OXYCODONE HCL 5 MG PO TABS
2.5000 mg | ORAL_TABLET | Freq: Four times a day (QID) | ORAL | Status: DC | PRN
  Administered 2014-07-23 (×2): 2.5 mg via ORAL
  Filled 2014-07-23 (×2): qty 1

## 2014-07-23 MED ORDER — ASPIRIN 325 MG PO TABS
325.0000 mg | ORAL_TABLET | Freq: Once | ORAL | Status: AC
  Administered 2014-07-23: 325 mg via ORAL
  Filled 2014-07-23: qty 1

## 2014-07-23 MED ORDER — ASPIRIN 81 MG PO CHEW
81.0000 mg | CHEWABLE_TABLET | Freq: Every day | ORAL | Status: DC
  Administered 2014-07-23 – 2014-07-24 (×2): 81 mg via ORAL
  Filled 2014-07-23 (×2): qty 1

## 2014-07-23 MED ORDER — OXYCODONE HCL 5 MG PO TABS
2.5000 mg | ORAL_TABLET | ORAL | Status: DC | PRN
  Administered 2014-07-24 – 2014-07-25 (×7): 2.5 mg via ORAL
  Filled 2014-07-23 (×7): qty 1

## 2014-07-23 MED ORDER — OXYCODONE-ACETAMINOPHEN 5-325 MG PO TABS
1.0000 | ORAL_TABLET | ORAL | Status: DC | PRN
  Administered 2014-07-23 (×2): 1 via ORAL
  Filled 2014-07-23 (×2): qty 1

## 2014-07-23 NOTE — Progress Notes (Signed)
Subjective: Patient continues to complain of significant left hip pain. He states current pain management regimen not sufficient to control pain. He denies chest pain/tightness pressure, shortness of breath, radiation of pain down arm. He denies headache, dizziness.   Objective: Vital signs in last 24 hours: Filed Vitals:   07/22/14 1847 07/22/14 1952 07/22/14 2120 07/23/14 0438  BP: 140/72 129/72  137/65  Pulse: 93 87 93 78  Temp: 98 F (36.7 C) 98.2 F (36.8 C)  98 F (36.7 C)  TempSrc: Oral Oral  Oral  Resp: 16 18 18 18   Height:      Weight:      SpO2: 96% 95% 95% 99%   Weight change:   Intake/Output Summary (Last 24 hours) at 07/23/14 1150 Last data filed at 07/23/14 1109  Gross per 24 hour  Intake    600 ml  Output   1800 ml  Net  -1200 ml   Constitutional: Patient is a thin gentleman in no acute distress and cooperative with exam.  Mouth: MMM Cardiovascular: RRR, S1 normal, S2 normal, no MRG, pulses symmetric and intact bilaterally Pulmonary/Chest: normal respiratory effort; diffuse expiratory wheezing, no crackes Abdominal: Soft. Non-tender, non-distended, bowel sounds are normal Musculoskeletal: No joint deformities, erythema, or stiffness, tenderness to palpation over L hip  Skin: Warm, dry and intact. No rash, cyanosis, or clubbing.  Mild bruising over ventral surface of L thigh. Mild swelling of L greater trochanter Extremities: 2+ dorsalis pedis pulses bilaterally Psychiatric: Normal mood and affect. Patient has thick [place] accent; speech does not seem slurred Neurological: A&Ox3; CN II-XII grossly intact bilaterally expect for chronic hearing loss on R side; gait cannot be assessed given patient's left hip pain  Labs: Basic Metabolic Panel:  Recent Labs Lab 07/22/14 1256 07/22/14 1935 07/23/14 0715  NA 137  --  136  K 4.6  --  4.4  CL 104  --  108  CO2 25  --  22  GLUCOSE 147*  --  112*  BUN 33*  --  14  CREATININE 1.21  --  0.76  CALCIUM 8.8*   --  8.2*  MG  --  2.1  --    CBC:  Recent Labs Lab 07/22/14 1256 07/23/14 0715  WBC 12.5* 7.4  NEUTROABS 10.1*  --   HGB 15.1 13.2  HCT 44.6 39.5  MCV 89.6 89.8  PLT 185 147*   Cardiac Enzymes:  Recent Labs Lab 07/22/14 1256 07/22/14 1935 07/23/14 0145 07/23/14 0715  CKTOTAL 93716*  --   --  12793*  TROPONINI 0.64* 0.76* 0.54* 0.36*   CBG:  Recent Labs Lab 07/23/14 0621  GLUCAP 91   Urine Drug Screen: Drugs of Abuse     Component Value Date/Time   LABOPIA POSITIVE* 07/23/2014 0159   LABOPIA NEG 04/12/2014 1136   COCAINSCRNUR NONE DETECTED 07/23/2014 0159   COCAINSCRNUR NEG 04/12/2014 1136   LABBENZ NONE DETECTED 07/23/2014 0159   LABBENZ PPS 04/12/2014 1136   LABBENZ NEG 02/18/2009 2050   AMPHETMU NONE DETECTED 07/23/2014 0159   AMPHETMU NEG 04/12/2014 1136   AMPHETMU NEG 02/18/2009 2050   THCU NONE DETECTED 07/23/2014 0159   THCU NEG 04/12/2014 1136   LABBARB NONE DETECTED 07/23/2014 0159   LABBARB NEG 04/12/2014 1136    Alcohol Level:  Recent Labs Lab 07/22/14 1935  ETH <5   Urinalysis:  Recent Labs Lab 07/23/14 0200  COLORURINE YELLOW  LABSPEC 1.016  PHURINE 5.5  GLUCOSEU NEGATIVE  HGBUR LARGE*  BILIRUBINUR NEGATIVE  KETONESUR NEGATIVE  PROTEINUR 30*  UROBILINOGEN 0.2  NITRITE NEGATIVE  LEUKOCYTESUR NEGATIVE   Urine microscopic add-on: -rare squamous epithelial cells -0-2 WBC/HPF -0-2 RBC/HPF -rare bacteria  Other results: EKG(07/23/2014) :  -Sinus rhythm, rate = 79 -PR interval = 162, QRS = 94, QT/QTc = 404/463 -T wave inversions in aVL and flattening in I (also present on ECG from 02/14/2014) -P waves appear enlarged in leads II, II, and aVF  -Minimal ST elevation with concavity in leads V2 and V3 (also present on ECG from 02/14/2014) -No significant change from ECG on 07/22/2014; ECG not concerning for ACS  Imaging: Ct Head Wo Contrast  07/22/2014   CLINICAL DATA:  Loss of consciousness, history of brain infection  EXAM: CT  HEAD WITHOUT CONTRAST  TECHNIQUE: Contiguous axial images were obtained from the base of the skull through the vertex without intravenous contrast.  COMPARISON:  05/22/2014 and 03/27/2014  FINDINGS: No evidence of parenchymal hemorrhage or extra-axial fluid collection.  Residual hypodensity in the subcortical right frontal lobe with central hyperdensity (series 2/ image 25), reflecting sequela of prior abscess, improved.  Additional abscesses in the subcortical left frontal lobe and right basal ganglia have improved/resolved.  New cortical hypodensities in the left occipital region (series 2/ images 13 and 17), worrisome for acute infarct.  No mass effect or midline shift.  Subcortical white matter and periventricular small vessel ischemic changes. Intracranial atherosclerosis.  Cerebral volume is within normal limits.  No ventriculomegaly.  The visualized paranasal sinuses are essentially clear. The mastoid air cells are unopacified.  No evidence of calvarial fracture.  IMPRESSION: New cortical hypodensities in the left occipital region, worrisome for acute infarct.  Residual hypodensity in the subcortical right frontal lobe, reflecting sequela of prior abscess, improved.  Additional abscesses in the subcortical left frontal lobe and right basal ganglia have improved/resolved.  These results were called by telephone at the time of interpretation on 07/22/2014 at 5:43 pm to Dr. Betsey [name], who verbally acknowledged these results.   Electronically Signed   By: [name] M.D.   On: 07/22/2014 17:45   Mr [name] NW Contrast  07/23/2014   CLINICAL DATA:  Syncopal episodes, woke up in bathtub this morning. History of seizures, substance abuse, brain abscess.  EXAM: MRI HEAD WITHOUT AND WITH CONTRAST  TECHNIQUE: Multiplanar, multiecho pulse sequences of the brain and surrounding structures were obtained without and with intravenous contrast.  CONTRAST:  40mL MULTIHANCE GADOBENATE DIMEGLUMINE 529 MG/ML IV SOLN   COMPARISON:  CT head July 22, 2014 at 1728 hours and MRI of the brain December 01, 2013  FINDINGS: Multiple foci of reduced diffusion within the LEFT greater than RIGHT occipital lobes, measuring up to 12 mm with rounded configuration, corresponding low ADC values. In addition, LEFT caudate head, bilateral bifrontal subcentimeter foci of reduced diffusion. No superimposed abnormal enhancement. Subcentimeter focus of susceptibility artifact RIGHT basal ganglia, RIGHT centrum semiovale, LEFT frontal vertex at site of prior abscesses. No midline shift, mass effect or mass lesions on today's examination. 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 Mild encephalomalacia RIGHT basal ganglia, RIGHT corona radiata and LEFT frontal vertex corresponding to prior abscess. Faint enhancement RIGHT basal ganglia at site of prior abscess.  No abnormal extra-axial fluid collections, extra-axial enhancement nor extra-axial masses. Stable loss of LEFT internal carotid artery flow void from the level of the cervical internal carotid artery, to the cavernous segment, flow void LEFT supra clinoid internal carotid artery suggesting retrograde flow.  Trace paranasal sinus mucosal thickening without air-fluid  levels. Ocular globes and orbital contents are nonsuspicious though not tailored for evaluation.  No abnormal sellar expansion. No cerebellar tonsillar ectopia. No suspicious calvarial bone marrow signal.  IMPRESSION: Biparietal, bifrontal and LEFT occipital, LEFT caudate head foci of acute ischemia favoring watershed infarcts, less likely embolic. Chronic LEFT internal carotid artery occlusion.  Old cerebral abscess with faint residual enhancement RIGHT basal ganglia. No new abscess or MR findings of acute intracranial infection on today's examination.   Electronically Signed   By: Elon Alas M.D.   On: 07/23/2014 02:55   Dg Chest Port 1 View  07/22/2014   CLINICAL DATA:  Elevated cardiac enzymes. Peripheral vascular disease.   EXAM: PORTABLE CHEST - 1 VIEW  COMPARISON:  07/01/14  FINDINGS: Heart size is within normal limits. Both lungs are clear. No evidence of pneumothorax or pleural effusion. Mild hyperinflation again noted, suspicious for COPD. Old left clavicle and rib fracture deformities again noted.  IMPRESSION: Stable exam.  Probable COPD.  No active disease.   Electronically Signed   By: Earle Gell M.D.   On: 07/22/2014 14:56   Dg Hip Unilat With Pelvis 2-3 Views Left  07/22/2014   CLINICAL DATA:  Left hip pain  EXAM: DG HIP (WITH OR WITHOUT PELVIS) 2-3V LEFT  COMPARISON:  None.  FINDINGS: There is no evidence of hip fracture or dislocation. There is no evidence of arthropathy or other focal bone abnormality.  IMPRESSION: Negative.   Electronically Signed   By: Franchot Gallo M.D.   On: 07/22/2014 14:30    Medications:  Infusions: . sodium chloride 150 mL/hr at 07/23/14 0244    Scheduled Medications: . amoxicillin-clavulanate  875 mg Oral Q12H  . capsaicin   Topical BID  . cyclobenzaprine  7.5 mg Oral QHS  . gabapentin  600 mg Oral QHS  . ipratropium-albuterol  3 mL Nebulization Q6H  . levETIRAcetam  500 mg Oral BID  . mometasone-formoterol  2 puff Inhalation BID  . nicotine  21 mg Transdermal Q24H  . pantoprazole  40 mg Oral Daily  . rivaroxaban  20 mg Oral Q supper  . sodium chloride  3 mL Intravenous Q12H    PRN Medications: oxyCODONE-acetaminophen **AND** oxyCODONE    Assessment/Plan: Principal Problem:   Rhabdomyolysis Active Problems:   Type 2 diabetes mellitus   HYPERTENSION, BENIGN ESSENTIAL   Peripheral vascular disease   COPD (chronic obstructive pulmonary disease)   Depression   Brain abscess, Hx of   Pulmonary embolism   Fall at home   [name] is a 54 y.o. 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 Now with elevated creatine kinase and elevated troponin I likely secondary to rhabdomyolysis due to prolonged immobility.   #Rhabdomyolysis. CK = [id number] -> [id number] (07/23/2014). Likely secondary to prolonged immobility. Patient describes recent dark urine consistent with myoglobinuria. Patient initially received 2 x 1087mL boluses of NS in ED. - Continue NS @ 119mL/hr pending repeat CK tomorrow morning - Strict I/Os - Urine output: produced 1.1L urine overnight (over 12 hrs) = 58mL/hr -  Trending creatinine: 1.21 -> 0.76 (7/11/16am); Repeat BMP in am (Cr) - Trending CK = 81829 -> 93716 (7/11/16am); Repeat CK in am  - Urinalysis: significant for hemoglobin and no RBCs c/w with myoglobinuria  #Falls. Head MRI (07/23/2014) showed biparietal, bifrontal and left occipital, left caudate head foci of acute ischemia favoring watershed infarcts, less likely embolic. Watershed infarct likely due to sudden hypotension and is likely etiology of patients fall on 07/22/2014.  Orthostatic hypotension also possible etiology of patient's fall given his dry appearance on initial physical exam. Patient is not on anti-HTNs. Othostatics could not be obtained given he could not stand (and he had additionally already received 2L NS in the ED). Seizures less likely given he does not describe sx of seizure with this episode or with prior falls. - Neuro consulted; appreciate recs - Continue seizure precautions - Continue Keppra 500mg  BID - PT eval today - Consider ECHO, carotid duplex - Consider restarting ASA after resolution of rhabdomyolysis and starting statin  #Left hip pain. Left hip x-ray showed no  evidence of hip fracture, dislocation, evidence of arthropathy or other focal bone abnormality. - Increase oxycodone-acetaminophen 5-325mg  + 2.5mg  oxycodone to Q4H for pain - Continue capsaicin cream BID to left hip - Continue Flexeril 7.5mg  daily   #Elevated troponin I. Troponin 0.64 -> 0.76 -> 0.54 -> 0.36. No concern for ACS on repeat ECG this morning. Patient denies classic ACS symptoms including chest pain/pressure/tightness, arm pain, shortness of breath, palpitations. In rhabdomyolysis, serum cardiac troponin I may be elevated is patients without ischemic myocardial disease or ACS (Punukollu et al., International Journal of Cardiology, 2004, 96:35-40). Proposed hypothesis for elevated cTnI in cases with rhabdomyolysis is microinjury to myocardium secondary to free radicals, circulating cytokines, cardiotoxicity due to ion fluxes and high acidemia, and hypotension/hypoperfusion. Notably, per retrospective cohort study of ED patients, the prevalence of false-positive cTnI in ED patients with rhabdomyolysis is 17% Nicoletta Dress [facility] et al., Am J Emerg Med, 2005, 23(7): 860-3). - D/c telemetry tomorrow  #History of brain abcess s/p completed antibiotic course. Last saw Dr. Carlyle Basques (ID) on 06/04/2014. Plan was to continue amoxicillin for an additional 6 weeks and to continue Keppra with RTC in 6 to 8 weeks. - Augmentin 875mg  BID - Continue Keppra 500mg  BID [ ]  schedule follow-up appointment with Dr. Baxter Flattery  #History of acute pulmonary embolism (01/03/14).  - Continue home Xarelto 20mg  daily  #GERD.  - Continue home pantoprazole 40mg  daily  #History of heroin abuse. Patient states that he has not used heroin in the past 6 months. Per chart review, patient started using IV heroin about a 1.5 years ago when his pain medications were not renewed.  - Avoid increasing narcotics dosing  #Tobacco abuse. 100+ pack-year smoking history. Now smoking 1 to 1.5 packs of cigarettes per day.  - Nicotine patch  21mg  qd   #COPD. Progressive emphysema; smoking history of 100+ pack years.Wheezing on exam. Uses Combivent and Advair at home. 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 Patient is followed by Dr. [name] Early (vasc surg) - Continue gabapentin 600mg  daily  #DMII. Not currently taking any medications. Last A1c (03/13/2014): 4.9. Glucose today = 147. Metformin dc'd by Dr. Lottie Mussel on 03/13/2014. - CBG monitoring daily - Can start SSI if CBGs elevated  #Hx essential hypertension. Normotensive. Seen by Dr. Vanetta Shawl on 03/13/2014 with well-controlled BP and  no need for anti-hypertensives. - Continue to monitor BPs  #Hx of prior CVA in 08/2008. Patient is not on statin or ASA [ ]  Follow up on discharge: Lipid panel and starting statin/ASA  #Leukocytosis. Resolved. Initially WBC count = 12.5; likely reactive.  #FEN/GI/PPX - Fluids: NS @ 17mL/hr - Electrolytes: Monitor and replete as needed - Nutrition: HH - VTE ppx: Xarelto per above - GI ppx: protonix per above  #Code Status:  Full code = verified with patient  #Dispo: Disposition is deferred at this time, awaiting improvement of current medical problems.   The patient does have a current PCP (Rushil Sherrye Payor, MD) and does need an [facility] hospital follow-up appointment after discharge.  SERVICE NEEDED AT [facility]         Y = Yes, Blank = No PT:   OT:   RN:   Equipment:   Other:    Length of Stay: 1 day(s)  This is a Careers information officer Note.  The care of the patient was discussed with Dr. Denton Brick and the assessment and plan formulated with their assistance.  Please see their attached note for official documentation of the daily encounter.   LOS: 1 day   Quita Skye, Med Student 07/23/2014, 11:50 AM

## 2014-07-23 NOTE — Progress Notes (Signed)
Subjective: Complaints of pain in his left hip, doesn't think the pain meds are helping a lot, but he is able to move his left leg and hip about today, where as yesterday he wasn't at all. No chest pain since admission.  Objective: Vital signs in last 24 hours: Filed Vitals:   07/22/14 1847 07/22/14 1952 07/22/14 2120 07/23/14 0438  BP: 140/72 129/72  137/65  Pulse: 93 87 93 78  Temp: 98 F (36.7 C) 98.2 F (36.8 C)  98 F (36.7 C)  TempSrc: Oral Oral  Oral  Resp: 16 18 18 18   Height:      Weight:      SpO2: 96% 95% 95% 99%   Weight change:   Intake/Output Summary (Last 24 hours) at 07/23/14 1136 Last data filed at 07/23/14 1109  Gross per 24 hour  Intake    600 ml  Output   1800 ml  Net  -1200 ml   General appearance: alert, cooperative, appears stated age and disheveled Head: Normocephalic, without obvious abnormality, atraumatic Lungs: clear to auscultation bilaterally Heart: regular rate and rhythm, S1, S2 normal, no murmur, click, rub or gallop Abdomen: soft, non-tender; bowel sounds normal; no masses,  no organomegaly Extremities: Left hip- tense tender ~6 by 8cm swelling greater tronchanteric area, mild bruising on skin, no cyanosis or edema of extremities Pulses: difficult to palpate but present. Skin: has mild chronic diffuse reddish dis-colouration on lower extremities, about the ankles to mid calf.  Lab Results: Basic Metabolic Panel:  Recent Labs Lab 07/22/14 1256 07/22/14 1935 07/23/14 0715  NA 137  --  136  K 4.6  --  4.4  CL 104  --  108  CO2 25  --  22  GLUCOSE 147*  --  112*  BUN 33*  --  14  CREATININE 1.21  --  0.76  CALCIUM 8.8*  --  8.2*  MG  --  2.1  --    CBC:  Recent Labs Lab 07/22/14 1256 07/23/14 0715  WBC 12.5* 7.4  NEUTROABS 10.1*  --   HGB 15.1 13.2  HCT 44.6 39.5  MCV 89.6 89.8  PLT 185 147*   Cardiac Enzymes:  Recent Labs Lab 07/22/14 1256 07/22/14 1935 07/23/14 0145 07/23/14 0715  CKTOTAL 24401*  --   --   12793*  TROPONINI 0.64* 0.76* 0.54* 0.36*   CBG:  Recent Labs Lab 07/23/14 0621  GLUCAP 91   Urine Drug Screen: Drugs of Abuse     Component Value Date/Time   LABOPIA POSITIVE* 07/23/2014 0159   LABOPIA NEG 04/12/2014 1136   COCAINSCRNUR NONE DETECTED 07/23/2014 0159   COCAINSCRNUR NEG 04/12/2014 1136   LABBENZ NONE DETECTED 07/23/2014 0159   LABBENZ PPS 04/12/2014 1136   LABBENZ NEG 02/18/2009 2050   AMPHETMU NONE DETECTED 07/23/2014 0159   AMPHETMU NEG 04/12/2014 1136   AMPHETMU NEG 02/18/2009 2050   THCU NONE DETECTED 07/23/2014 0159   THCU NEG 04/12/2014 1136   LABBARB NONE DETECTED 07/23/2014 0159   LABBARB NEG 04/12/2014 1136    Alcohol Level:  Recent Labs Lab 07/22/14 1935  ETH <5   Urinalysis:  Recent Labs Lab 07/23/14 0200  COLORURINE YELLOW  LABSPEC 1.016  PHURINE 5.5  GLUCOSEU NEGATIVE  HGBUR LARGE*  BILIRUBINUR NEGATIVE  KETONESUR NEGATIVE  PROTEINUR 30*  UROBILINOGEN 0.2  NITRITE NEGATIVE  LEUKOCYTESUR NEGATIVE   Micro Results: No results found for this or any previous visit (from the past 240 hour(s)). Studies/Results: Ct Head Wo Contrast  07/22/2014   CLINICAL DATA:  Loss of consciousness, history of brain infection  EXAM: CT HEAD WITHOUT CONTRAST  TECHNIQUE: Contiguous axial images were obtained from the base of the skull through the vertex without intravenous contrast.  COMPARISON:  05/22/2014 and 03/27/2014  FINDINGS: No evidence of parenchymal hemorrhage or extra-axial fluid collection.  Residual hypodensity in the subcortical right frontal lobe with central hyperdensity (series 2/ image 25), reflecting sequela of prior abscess, improved.  Additional abscesses in the subcortical left frontal lobe and right basal ganglia have improved/resolved.  New cortical hypodensities in the left occipital region (series 2/ images 13 and 17), worrisome for acute infarct.  No mass effect or midline shift.  Subcortical white matter and periventricular small  vessel ischemic changes. Intracranial atherosclerosis.  Cerebral volume is within normal limits.  No ventriculomegaly.  The visualized paranasal sinuses are essentially clear. The mastoid air cells are unopacified.  No evidence of calvarial fracture.  IMPRESSION: New cortical hypodensities in the left occipital region, worrisome for acute infarct.  Residual hypodensity in the subcortical right frontal lobe, reflecting sequela of prior abscess, improved.  Additional abscesses in the subcortical left frontal lobe and right basal ganglia have improved/resolved.  These results were called by telephone at the time of interpretation on 07/22/2014 at 5:43 pm to Dr. Betsey Holiday, who verbally acknowledged these results.   Electronically Signed   By: Julian Hy M.D.   On: 07/22/2014 17:45   [name] TG Contrast  07/23/2014   CLINICAL DATA:  Syncopal episodes, woke up in bathtub this morning. History of seizures, substance abuse, brain abscess.  EXAM: MRI HEAD WITHOUT AND WITH CONTRAST  TECHNIQUE: Multiplanar, multiecho pulse sequences of the brain and surrounding structures were obtained without and with intravenous contrast.  CONTRAST:  64mL MULTIHANCE GADOBENATE DIMEGLUMINE 529 MG/ML IV SOLN  COMPARISON:  CT head July 22, 2014 at 1728 hours and MRI of the brain December 01, 2013  FINDINGS: Multiple foci of reduced diffusion within the LEFT greater than RIGHT occipital lobes, measuring up to 12 mm with rounded configuration, corresponding low ADC values. In addition, LEFT caudate head, bilateral bifrontal subcentimeter foci of reduced diffusion. No superimposed abnormal enhancement. Subcentimeter focus of susceptibility artifact RIGHT basal ganglia, RIGHT centrum semiovale, LEFT frontal vertex at site of prior abscesses. No midline shift, mass effect or mass lesions on today's examination. 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 Mild encephalomalacia RIGHT basal ganglia, RIGHT corona radiata and LEFT frontal vertex  corresponding to prior abscess. Faint enhancement RIGHT basal ganglia at site of prior abscess.  No abnormal extra-axial fluid collections, extra-axial enhancement nor extra-axial masses. Stable loss of LEFT internal carotid artery flow void from the level of the cervical internal carotid artery, to the cavernous segment, flow void LEFT supra clinoid internal carotid artery suggesting retrograde flow.  Trace paranasal sinus mucosal thickening without air-fluid levels. Ocular globes and orbital contents are nonsuspicious though not tailored for evaluation.  No abnormal sellar expansion. No cerebellar tonsillar ectopia. No suspicious calvarial bone marrow signal.  IMPRESSION: Biparietal, bifrontal and LEFT occipital, LEFT caudate head foci of acute ischemia favoring watershed infarcts, less likely embolic. Chronic LEFT internal carotid artery occlusion.  Old cerebral abscess with faint residual enhancement RIGHT basal ganglia. No new abscess or MR findings of acute intracranial infection on today's examination.   Electronically Signed   By: Elon Alas M.D.   On: 07/23/2014 02:55   Dg Chest Port 1 View  07/22/2014   CLINICAL DATA:  Elevated cardiac enzymes.  Peripheral vascular disease.  EXAM: PORTABLE CHEST - 1 VIEW  COMPARISON:  07/01/14  FINDINGS: Heart size is within normal limits. Both lungs are clear. No evidence of pneumothorax or pleural effusion. Mild hyperinflation again noted, suspicious for COPD. Old left clavicle and rib fracture deformities again noted.  IMPRESSION: Stable exam.  Probable COPD.  No active disease.   Electronically Signed   By: Earle Gell M.D.   On: 07/22/2014 14:56   Dg Hip Unilat With Pelvis 2-3 Views Left  07/22/2014   CLINICAL DATA:  Left hip pain  EXAM: DG HIP (WITH OR WITHOUT PELVIS) 2-3V LEFT  COMPARISON:  None.  FINDINGS: There is no evidence of hip fracture or dislocation. There is no evidence of arthropathy or other focal bone abnormality.  IMPRESSION: Negative.    Electronically Signed   By: Franchot Gallo M.D.   On: 07/22/2014 14:30   Medications: I have reviewed the patient's current medications. Scheduled Meds: . amoxicillin-clavulanate  875 mg Oral Q12H  . capsaicin   Topical BID  . cyclobenzaprine  7.5 mg Oral QHS  . gabapentin  600 mg Oral QHS  . ipratropium-albuterol  3 mL Nebulization Q6H  . levETIRAcetam  500 mg Oral BID  . mometasone-formoterol  2 puff Inhalation BID  . nicotine  21 mg Transdermal Q24H  . pantoprazole  40 mg Oral Daily  . rivaroxaban  20 mg Oral Q supper  . sodium chloride  3 mL Intravenous Q12H   Continuous Infusions: . 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 Likely due to fall and subsequent immobility. Getting IV fluids. - Cont IVF- 150cc/hr  - Check CK level Am, goal <10000. - Strict in and out. - Bmet Am, check Cr  Falls- MRI head with watershed infarct, likely due to sudden hypotension. Not on BP meds. Orthostatic vitals could not be check as pt could not stand and had gotten 2L already in the ED. but has prior hx of falls.  - PT eval -Neurology consulted - IVF N/s 150cc/hr - Cont Keppra for Seizure prophylaxis, neurology can determine if and when pt can stop this med. - Percocet- 7.5-325 for pain switch form Q6H to Q4H, also try flexeril and capsaisin cream. - BEd rest - Seizure precautions  Elevated Troponin I - 0.69, peaked at 0.76 and gradually trended down- 0.36. Has no chest pain today, no symptoms referable to ACS and no EKG findings concerning for ACS. In severe muscle injury troponins though specific for cardiac infarction could be elevated from skeletal muscle injury, also possible with acute Stroke.  COPD- Wheezing on exam - Cont Home advair as Dulera - Duonebs Q6H Scheduled, no wheezing switch to PRN.  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 UDS positive  for opiates only, but had gotten IV dilaudid in the ED. - Cont keppra for seizure ppx - Also Cont amoxicillin- prescribed by ID for 6 weeks, per note written in may, but he was already on med then. Concern was for residual infection per CT head gotten when pt was in the Ed for seizures.   Pulmonary Embolism- Diagnosed in December 2015. 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  - Not on statin - Follow up on follow up- Lipid panel and Starting Statin - Start Aspirin.   DVT ppx- On Xarelto for Pulmonary embolism.   Diet/FEN- heart/Carb mod  Dispo: Disposition is deferred at this time,  awaiting improvement of current medical problems.  Anticipated discharge in approximately tomorrow.  The patient does have a current PCP (Rushil Sherrye Payor, MD) and does need an [facility] follow-up appointment after discharge.  The patient does not know have transportation limitations that hinder transportation to clinic appointments.  .Services Needed at time of discharge: Y = Yes, Blank = No PT:   OT:   RN:   Equipment:   Other:     LOS: 1 day   Bethena Roys, MD 07/23/2014, 11:36 AM \
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
## 2014-07-23 NOTE — Progress Notes (Signed)
Called teaching service about stroke protocol and swallow screening. They felt that was not needed at this time.  Sukhmani Fetherolf, Mervin Kung RN

## 2014-07-23 NOTE — Progress Notes (Signed)
PT Cancellation Note  Patient Details Name: [name] MRN: [id number] DOB: November 25, 1960   Cancelled Treatment:    Reason Eval/Treat Not Completed: Patient not medically ready. Pt continues to be on strict bed rest. Discussed with RN who asks that PT hold for this morning, and will page this therapist this afternoon if pt is appropriate to participate.     Rolinda Roan 07/23/2014, 11:54 AM   Rolinda Roan, PT, DPT Acute Rehabilitation Services Pager: [phone]

## 2014-07-24 ENCOUNTER — Inpatient Hospital Stay (HOSPITAL_COMMUNITY): Payer: Medicaid Other

## 2014-07-24 ENCOUNTER — Other Ambulatory Visit (HOSPITAL_COMMUNITY): Payer: Self-pay

## 2014-07-24 DIAGNOSIS — I6340 Cerebral infarction due to embolism of unspecified cerebral artery: Secondary | ICD-10-CM

## 2014-07-24 DIAGNOSIS — F17210 Nicotine dependence, cigarettes, uncomplicated: Secondary | ICD-10-CM

## 2014-07-24 LAB — CK: CK TOTAL: 8582 U/L — AB (ref 49–397)

## 2014-07-24 LAB — LIPID PANEL
Cholesterol: 182 mg/dL (ref 0–200)
HDL: 42 mg/dL (ref >40)
LDL Cholesterol: 105 mg/dL — ABNORMAL HIGH (ref 0–99)
TRIGLYCERIDES: 176 mg/dL — AB (ref <150)
Total CHOL/HDL Ratio: 4.3 RATIO
VLDL: 35 mg/dL (ref 0–40)

## 2014-07-24 LAB — GLUCOSE, CAPILLARY: Glucose-Capillary: 113 mg/dL — ABNORMAL HIGH (ref 65–99)

## 2014-07-24 MED ORDER — NICOTINE POLACRILEX 2 MG MT GUM
2.0000 mg | CHEWING_GUM | OROMUCOSAL | Status: DC | PRN
  Administered 2014-07-24: 2 mg via ORAL
  Filled 2014-07-24 (×3): qty 1

## 2014-07-24 MED ORDER — TRAZODONE HCL 50 MG PO TABS
50.0000 mg | ORAL_TABLET | Freq: Every evening | ORAL | Status: DC | PRN
  Administered 2014-07-24: 50 mg via ORAL
  Filled 2014-07-24 (×2): qty 1

## 2014-07-24 MED ORDER — SODIUM CHLORIDE 0.9 % IV SOLN
INTRAVENOUS | Status: DC
  Administered 2014-07-24: 11:00:00 via INTRAVENOUS

## 2014-07-24 NOTE — Evaluation (Signed)
Physical Therapy Evaluation Patient Details Name: [name] MRN: [id number] DOB: 1960-07-11 Today's Date: 07/24/2014   History of Present Illness  54 y.o. male with an extensive past medical history including PE now on Xarelto, DMII, HLD, IV heroin abuse with brain abscess (now on ppx amoxicillin), stroke of MCA territory (10/2008)--with visual problems now resolved, tobacco abuse, COPD, PVD, OSA, and carotid stenosis who presented to [facility] after waking up in his bathtub this morning (07/22/2014) with L hip pain, unsure how he got there.   Clinical Impression  Pt was able to demonstrate some control in standing for LLE but very dependent on the RW.  He is optimistic that he should just go home, but his motivations for being there are not consistent with the reality of his safety level.  He is a high fall risk at home, and will not recommend this.  Plan to focus on gait and transfers to work toward his likelihood of refusing to go to SNF.    Follow Up Recommendations SNF    Equipment Recommendations  3in1 (PT)    Recommendations for Other Services       Precautions / Restrictions Precautions Precautions: Fall Restrictions Weight Bearing Restrictions: No      Mobility  Bed Mobility Overal bed mobility: Modified Independent Bed Mobility: Supine to Sit;Sit to Supine     Supine to sit: Supervision Sit to supine: Supervision   General bed mobility comments: supervised for safety  Transfers Overall transfer level: Needs assistance Equipment used: Rolling walker (2 wheeled);1 person hand held assist Transfers: Sit to/from Stand;Stand Pivot Transfers Sit to Stand: Min guard;Supervision Stand pivot transfers: Min guard;Supervision       General transfer comment: reminders for use of hand placement and for controlling descent  Ambulation/Gait Ambulation/Gait assistance: Min guard Ambulation Distance (Feet): 150 Feet Assistive device: Rolling walker (2  wheeled);1 person hand held assist Gait Pattern/deviations: Step-through pattern;Decreased dorsiflexion - right;Decreased dorsiflexion - left;Wide base of support;Trunk flexed;Decreased stance time - left Gait velocity: slower Gait velocity interpretation: Below normal speed for age/gender    Stairs            Wheelchair Mobility    Modified Rankin (Stroke Patients Only)       Balance Overall balance assessment: Needs assistance Sitting-balance support: Feet supported Sitting balance-Leahy Scale: Good   Postural control: Posterior lean Standing balance support: Bilateral upper extremity supported Standing balance-Leahy Scale: Poor Standing balance comment: related to the pain in L hip from his chronic health conditions                             Pertinent Vitals/Pain Pain Assessment: 0-10 Pain Score: 8  Pain Location: L hip Pain Descriptors / Indicators: Cramping;Pressure Pain Intervention(s): Limited activity within patient's tolerance;Monitored during session;Repositioned;Other (comment) (RN reported to pt it was not time for meds yet)    Home Living Family/patient expects to be discharged to:: Private residence Living Arrangements: Alone Available Help at Discharge: Personal care attendant;Other (Comment) (daily assistance for a few hours) Type of Home: House Home Access: Ramped entrance     Home Layout: One level Home Equipment: [place] - 2 wheels;Cane - single point (Pt told PT he had no other equipment but contradicted OT)      Prior Function Level of Independence: Needs assistance   Gait / Transfers Assistance Needed: using cane  ADL's / Homemaking Assistance Needed: assist with bathing, dressing, and IADLs.  Hand Dominance        Extremity/Trunk Assessment   Upper Extremity Assessment: Overall WFL for tasks assessed           Lower Extremity Assessment: Generalized weakness      Cervical / Trunk Assessment: Normal   Communication   Communication: Other (comment) (pt is confused about details of home and help there)  Cognition Arousal/Alertness: Awake/alert Behavior During Therapy: Anxious Overall Cognitive Status: No family/caregiver present to determine baseline cognitive functioning       Memory: Decreased short-term memory              General Comments General comments (skin integrity, edema, etc.): Pt is not safe to walk alone, has been up with assistance only and adjusted the walker for better fit to unload LLE.   Nursing not able to administer meds yet for pain.    Exercises        Assessment/Plan    PT Assessment Patient needs continued PT services  PT Diagnosis Difficulty walking;Acute pain   PT Problem List Decreased strength;Decreased range of motion;Decreased activity tolerance;Decreased balance;Decreased mobility;Decreased coordination;Decreased cognition;Decreased knowledge of use of DME;Decreased safety awareness;Decreased skin integrity;Pain  PT Treatment Interventions DME instruction;Gait training;Stair training;Functional mobility training;Therapeutic activities;Therapeutic exercise;Balance training;Neuromuscular re-education;Cognitive remediation;Patient/family education   PT Goals (Current goals can be found in the Care Plan section) Acute Rehab PT Goals Patient Stated Goal: none stated PT Goal Formulation: With patient/family Time For Goal Achievement: 08/07/14 Potential to Achieve Goals: Good    Frequency Min 2X/week   Barriers to discharge Inaccessible home environment;Decreased caregiver support stairs to enter with poor gait tolerance    Co-evaluation               End of Session Equipment Utilized During Treatment: Gait belt Activity Tolerance: Patient limited by pain;Patient limited by fatigue Patient left: in bed;with call bell/phone within reach;with bed alarm set;with family/visitor present Nurse Communication: Mobility status          Time: 7048-8891 PT Time Calculation (min) (ACUTE ONLY): 29 min   Charges:   PT Evaluation $Initial PT Evaluation Tier I: 1 Procedure PT Treatments $Gait Training: 8-22 mins   PT G Codes:        Ramond Dial 2014/08/03, 4:08 PM   Mee Hives, PT MS Acute Rehab Dept. Number: [facility] [id number] and [place] [phone]

## 2014-07-24 NOTE — Plan of Care (Signed)
Problem: RH PAIN MANAGEMENT Goal: RH STG PAIN MANAGED AT OR BELOW PT'S PAIN GOAL Outcome: Progressing Different techniques of pain management

## 2014-07-24 NOTE — Evaluation (Addendum)
Occupational Therapy Evaluation Patient Details Name: [name] MRN: [id number] DOB: Sep 05, 1960 Today's Date: 07/24/2014    History of Present Illness 55 y.o. male with an extensive past medical history including PE now on Xarelto, DMII, HLD, IV heroin abuse with brain abscess (now on ppx amoxicillin), stroke of MCA territory (10/2008)--with visual problems now resolved, tobacco abuse, COPD, PVD, OSA, and carotid stenosis who presented to [facility] after waking up in his bathtub this morning (07/22/2014) with L hip pain, unsure how he got there.    Clinical Impression   Pt admitted with above. Pt has aide that comes 7x/week for a couple of hours to assist with ADLs/IADLs. Feel pt will benefit from acute OT to increase independence prior to d/c.    Follow Up Recommendations  Home health OT;Supervision/Assistance - 24 hour    Equipment Recommendations  None recommended by OT    Recommendations for Other Services       Precautions / Restrictions Precautions Precautions: Fall Restrictions Weight Bearing Restrictions: No      Mobility Bed Mobility Overal bed mobility: Needs Assistance Bed Mobility: Supine to Sit;Sit to Supine     Supine to sit: Min assist Sit to supine: Supervision   General bed mobility comments: assist with trunk  Transfers Overall transfer level: Needs assistance   Transfers: Sit to/from Stand Sit to Stand: Min guard              Balance  No LOB in session. Used RW for ambulation.                                          ADL Overall ADL's : Needs assistance/impaired     Grooming: Wash/dry face;Standing;Set up;Supervision/safety               Lower Body Dressing: Sit to/from stand;Moderate assistance   Toilet Transfer: Min guard;Ambulation;RW (bed)           Functional mobility during ADLs: Rolling walker;Min guard General ADL Comments: Educated on AE and educated on LB dressing technique.   Spoke with pt about at least initially 24/7 assist/supervision.     Vision Pt has glasses (suppose to wear). Reports no change from baseline. Vision Assessment?: Yes Tracking/Visual Pursuits: Other (comment) (difficult on left side) Visual Fields: Other (comment) (initially inconsistent in superior quadrants, but did well later)   Perception     Praxis      Pertinent Vitals/Pain Pain Assessment: 0-10 Pain Score:  (8 towards beginning and 10 towards end of session) Pain Location: right calf and left hip Pain Descriptors / Indicators: Squeezing Pain Intervention(s): Monitored during session;Other (comment) (pt asked nurse for pain meds)     Hand Dominance     Extremity/Trunk Assessment Upper Extremity Assessment Upper Extremity Assessment: Overall WFL for tasks assessed (limited ROM with left shoulder flexion)   Lower Extremity Assessment Lower Extremity Assessment: Defer to PT evaluation       Communication Communication Communication: No difficulties   Cognition Arousal/Alertness: Awake/alert Behavior During Therapy: WFL for tasks assessed/performed Overall Cognitive Status: Within Functional Limits for tasks assessed                     General Comments       Exercises       Shoulder Instructions      Home Living Family/patient expects to be discharged to:: Private  residence Living Arrangements: Alone Available Help at Discharge: Personal care attendant;Available PRN/intermittently (7x a week ) Type of Home: House Home Access:  (reports elevator; ramped entrance per chart)     Home Layout: One level     Bathroom Shower/Tub: Tub/shower unit         Home Equipment: Cane - single point;Walker - 4 wheels;Bedside commode;Grab bars - tub/shower;Grab bars - toilet;Shower seat          Prior Functioning/Environment Level of Independence: Needs assistance  Gait / Transfers Assistance Needed: using cane ADL's / Homemaking Assistance Needed:  assist with bathing, dressing, and IADLs.        OT Diagnosis: Acute pain   OT Problem List: Pain;Decreased knowledge of precautions;Decreased knowledge of use of DME or AE;Decreased range of motion   OT Treatment/Interventions: Self-care/ADL training;Therapeutic exercise;Patient/family education;Balance training;Cognitive remediation/compensation;Therapeutic activities;DME and/or AE instruction    OT Goals(Current goals can be found in the care plan section) Acute Rehab OT Goals Patient Stated Goal: not stated OT Goal Formulation: With patient Time For Goal Achievement: 07/31/14 Potential to Achieve Goals: Good ADL Goals Pt Will Perform Lower Body Bathing: with set-up;with supervision;with adaptive equipment;with caregiver independent in assisting;sit to/from stand Pt Will Perform Lower Body Dressing: with set-up;with supervision;with adaptive equipment;sit to/from stand;with caregiver independent in assisting Pt Will Transfer to Toilet: with modified independence;ambulating;grab bars Pt Will Perform Toileting - Clothing Manipulation and hygiene: with modified independence;sit to/from stand Pt Will Perform Tub/Shower Transfer: Tub transfer;with supervision;ambulating;with caregiver independent in assisting;shower seat;rolling walker  OT Frequency: Min 2X/week   Barriers to D/C:            Co-evaluation              End of Session Equipment Utilized During Treatment: Gait belt;Rolling walker  Activity Tolerance: Patient tolerated treatment well Patient left: in bed;with call bell/phone within reach;with bed alarm set   Time: 1151-1207 OT Time Calculation (min): 16 min Charges:  OT General Charges $OT Visit: 1 Procedure OT Evaluation $Initial OT Evaluation Tier I: 1 Procedure G-CodesBenito Mccreedy OTR/L [id number] 07/24/2014, 12:39 PM

## 2014-07-24 NOTE — Clinical Documentation Improvement (Signed)
H&P states hx of CHF.  Please specify the type and acuity of heart failure if known.  Acuity: Acute Chronic Acute on Chronic  Type: Diastolic Systolic Combined Systolic/Diastolic Unable to suspect or determine  Thank you!  Pryor Montes, BSN, RN [facility]/Clinical Documentation Specialist [email or web address] [phone]

## 2014-07-24 NOTE — Care Management Note (Addendum)
Case Management Note Marvetta Gibbons RN, BSN Unit 2W-Case Manager [phone]  Patient Details  Name: [name] MRN: [id number] Date of Birth: 1960-08-24  Subjective/Objective:    Pt admitted with rhabdomyolysis                Action/Plan: PTA pt lived at home- alone- has a PCS aide with Medicaid that comes 7 days/week 2 1/2 hrs/day- pt states that he has both cane and RW at home and does not feel like he needs any other DME at this time- PT/OT evals pending- NCM to f/u on recommendations- Pt has Medicaid insurance and does not have a qualifying dx for any HH therapy services at this time. Per pt he reports that his had should be able to provide transportation home.   Expected Discharge Date:                  Expected Discharge Plan:  Home/Self Care  In-House Referral:     Discharge planning Services  CM Consult  Post Acute Care Choice:    Choice offered to:     DME Arranged:    DME Agency:     HH Arranged:    HH Agency:     Status of Service:  In process, will continue to follow  Medicare Important Message Given:    Date Medicare IM Given:    Medicare IM give by:    Date Additional Medicare IM Given:    Additional Medicare Important Message give by:     If discussed at [place] of Stay Meetings, dates discussed:    Additional Comments:  Dawayne Patricia, RN 07/24/2014, 11:03 AM
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
## 2014-07-24 NOTE — Plan of Care (Signed)
Problem: Acute Rehab PT Goals(only PT should resolve) Goal: Pt Will Ambulate On all surfaces

## 2014-07-24 NOTE — Discharge Instructions (Addendum)
1) You have an appointment scheduled for Tuesday, 07/31/2014 with Dr. Redmond Pulling at the [facility] 2) Make to sure to maintain adequate hydration, especially when outside in sun  3) Quitting smoking can help reduce your risk of future strokes.  Information on my medicine - XARELTO (rivaroxaban)  WHY WAS XARELTO PRESCRIBED FOR YOU? Xarelto was prescribed to treat blood clots that may have been found in your lungs (pulmonary embolism) and to reduce the risk of them occurring again.  What do you need to know about Xarelto? Take 20 mg tablet taken ONCE A DAY with your evening meal.  DO NOT stop taking Xarelto without talking to the health care provider who prescribed the medication.  Refill your prescription for 20 mg tablets before you run out.  After discharge, you should have regular check-up appointments with your healthcare provider that is prescribing your Xarelto.  In the future your dose may need to be changed if your kidney function changes by a significant amount.  What do you do if you miss a dose? If you are taking Xarelto TWICE DAILY and you miss a dose, take it as soon as you remember. You may take two 15 mg tablets (total 30 mg) at the same time then resume your regularly scheduled 15 mg twice daily the next day.  If you are taking Xarelto ONCE DAILY and you miss a dose, take it as soon as you remember on the same day then continue your regularly scheduled once daily regimen the next day. Do not take two doses of Xarelto at the same time.   Important Safety Information Xarelto is a blood thinner medicine that can cause bleeding. You should call your healthcare provider right away if you experience any of the following: ? Bleeding from an injury or your nose that does not stop. ? Unusual colored urine (red or dark brown) or unusual colored stools (red or black). ? Unusual bruising for unknown reasons. ? A serious fall or if you hit your head (even if  there is no bleeding).  Some medicines may interact with Xarelto and might increase your risk of bleeding while on Xarelto. To help avoid this, consult your healthcare provider or pharmacist prior to using any new prescription or non-prescription medications, including herbals, vitamins, non-steroidal anti-inflammatory drugs (NSAIDs) and supplements.  This website has more information on Xarelto: [email or web address]

## 2014-07-24 NOTE — Progress Notes (Signed)
Subjective: Patient wants to leave AMA. Wants to smoke cigs. He is otherwise having no complaint, deficits or change in vision.   Objective: Vital signs in last 24 hours: Filed Vitals:   07/23/14 2033 07/23/14 2200 07/24/14 0533 07/24/14 0929  BP: 143/73  169/91 141/70  Pulse: 85  83 85  Temp: 97.6 F (36.4 C) 97.8 F (36.6 C) 97.6 F (36.4 C) 97.7 F (36.5 C)  TempSrc: Oral  Oral Oral  Resp: 18  18 20   Height:      Weight:      SpO2: 98%  100%    Weight change:   Intake/Output Summary (Last 24 hours) at 07/24/14 1151 Last data filed at 07/24/14 1150  Gross per 24 hour  Intake    960 ml  Output   3625 ml  Net  -2665 ml   General appearance: alert, cooperative, appears stated age Head: Normocephalic, without obvious abnormality, atraumatic Lungs: clear to auscultation bilaterally Heart: regular rate and rhythm, S1, S2 normal, no murmur, click, rub or gallop Abdomen: soft, non-tender; bowel sounds normal; no masses,  no organomegaly Pulses: difficult to palpate but present.  Lab Results: Basic Metabolic Panel:  Recent Labs Lab 07/22/14 1256 07/22/14 1935 07/23/14 0715  NA 137  --  136  K 4.6  --  4.4  CL 104  --  108  CO2 25  --  22  GLUCOSE 147*  --  112*  BUN 33*  --  14  CREATININE 1.21  --  0.76  CALCIUM 8.8*  --  8.2*  MG  --  2.1  --    CBC:  Recent Labs Lab 07/22/14 1256 07/23/14 0715  WBC 12.5* 7.4  NEUTROABS 10.1*  --   HGB 15.1 13.2  HCT 44.6 39.5  MCV 89.6 89.8  PLT 185 147*   Cardiac Enzymes:  Recent Labs Lab 07/22/14 1256 07/22/14 1935 07/23/14 0145 07/23/14 0715 07/24/14 0308  CKTOTAL 14431*  --   --  12793* 8582*  TROPONINI 0.64* 0.76* 0.54* 0.36*  --    CBG:  Recent Labs Lab 07/23/14 0621 07/24/14 0625  GLUCAP 91 113*   Urine Drug Screen: Drugs of Abuse     Component Value Date/Time   LABOPIA POSITIVE* 07/23/2014 0159   LABOPIA NEG 04/12/2014 1136   COCAINSCRNUR NONE DETECTED 07/23/2014 0159   COCAINSCRNUR  NEG 04/12/2014 1136   LABBENZ NONE DETECTED 07/23/2014 0159   LABBENZ PPS 04/12/2014 1136   LABBENZ NEG 02/18/2009 2050   AMPHETMU NONE DETECTED 07/23/2014 0159   AMPHETMU NEG 04/12/2014 1136   AMPHETMU NEG 02/18/2009 2050   THCU NONE DETECTED 07/23/2014 0159   THCU NEG 04/12/2014 1136   LABBARB NONE DETECTED 07/23/2014 0159   LABBARB NEG 04/12/2014 1136    Alcohol Level:  Recent Labs Lab 07/22/14 1935  ETH <5   Urinalysis:  Recent Labs Lab 07/23/14 0200  COLORURINE YELLOW  LABSPEC 1.016  PHURINE 5.5  GLUCOSEU NEGATIVE  HGBUR LARGE*  BILIRUBINUR NEGATIVE  KETONESUR NEGATIVE  PROTEINUR 30*  UROBILINOGEN 0.2  NITRITE NEGATIVE  LEUKOCYTESUR NEGATIVE   Studies/Results: Ct Head Wo Contrast  07/22/2014   CLINICAL DATA:  Loss of consciousness, history of brain infection  EXAM: CT HEAD WITHOUT CONTRAST  TECHNIQUE: Contiguous axial images were obtained from the base of the skull through the vertex without intravenous contrast.  COMPARISON:  05/22/2014 and 03/27/2014  FINDINGS: No evidence of parenchymal hemorrhage or extra-axial fluid collection.  Residual hypodensity in the subcortical right frontal  lobe with central hyperdensity (series 2/ image 25), reflecting sequela of prior abscess, improved.  Additional abscesses in the subcortical left frontal lobe and right basal ganglia have improved/resolved.  New cortical hypodensities in the left occipital region (series 2/ images 13 and 17), worrisome for acute infarct.  No mass effect or midline shift.  Subcortical white matter and periventricular small vessel ischemic changes. Intracranial atherosclerosis.  Cerebral volume is within normal limits.  No ventriculomegaly.  The visualized paranasal sinuses are essentially clear. The mastoid air cells are unopacified.  No evidence of calvarial fracture.  IMPRESSION: New cortical hypodensities in the left occipital region, worrisome for acute infarct.  Residual hypodensity in the subcortical  right frontal lobe, reflecting sequela of prior abscess, improved.  Additional abscesses in the subcortical left frontal lobe and right basal ganglia have improved/resolved.  These results were called by telephone at the time of interpretation on 07/22/2014 at 5:43 pm to Dr. Betsey Holiday, who verbally acknowledged these results.   Electronically Signed   By: Julian Hy M.D.   On: 07/22/2014 17:45   [name] JM Contrast  07/23/2014   CLINICAL DATA:  Syncopal episodes, woke up in bathtub this morning. History of seizures, substance abuse, brain abscess.  EXAM: MRI HEAD WITHOUT AND WITH CONTRAST  TECHNIQUE: Multiplanar, multiecho pulse sequences of the brain and surrounding structures were obtained without and with intravenous contrast.  CONTRAST:  74mL MULTIHANCE GADOBENATE DIMEGLUMINE 529 MG/ML IV SOLN  COMPARISON:  CT head July 22, 2014 at 1728 hours and MRI of the brain December 01, 2013  FINDINGS: Multiple foci of reduced diffusion within the LEFT greater than RIGHT occipital lobes, measuring up to 12 mm with rounded configuration, corresponding low ADC values. In addition, LEFT caudate head, bilateral bifrontal subcentimeter foci of reduced diffusion. No superimposed abnormal enhancement. Subcentimeter focus of susceptibility artifact RIGHT basal ganglia, RIGHT centrum semiovale, LEFT frontal vertex at site of prior abscesses. No midline shift, mass effect or mass lesions on today's examination. 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 Mild encephalomalacia RIGHT basal ganglia, RIGHT corona radiata and LEFT frontal vertex corresponding to prior abscess. Faint enhancement RIGHT basal ganglia at site of prior abscess.  No abnormal extra-axial fluid collections, extra-axial enhancement nor extra-axial masses. Stable loss of LEFT internal carotid artery flow void from the level of the cervical internal carotid artery, to the cavernous segment, flow void LEFT supra clinoid internal carotid artery suggesting  retrograde flow.  Trace paranasal sinus mucosal thickening without air-fluid levels. Ocular globes and orbital contents are nonsuspicious though not tailored for evaluation.  No abnormal sellar expansion. No cerebellar tonsillar ectopia. No suspicious calvarial bone marrow signal.  IMPRESSION: Biparietal, bifrontal and LEFT occipital, LEFT caudate head foci of acute ischemia favoring watershed infarcts, less likely embolic. Chronic LEFT internal carotid artery occlusion.  Old cerebral abscess with faint residual enhancement RIGHT basal ganglia. No new abscess or MR findings of acute intracranial infection on today's examination.   Electronically Signed   By: Elon Alas M.D.   On: 07/23/2014 02:55   Dg Chest Port 1 View  07/22/2014   CLINICAL DATA:  Elevated cardiac enzymes. Peripheral vascular disease.  EXAM: PORTABLE CHEST - 1 VIEW  COMPARISON:  07/01/14  FINDINGS: Heart size is within normal limits. Both lungs are clear. No evidence of pneumothorax or pleural effusion. Mild hyperinflation again noted, suspicious for COPD. Old left clavicle and rib fracture deformities again noted.  IMPRESSION: Stable exam.  Probable COPD.  No active disease.   Electronically  Signed   By: Earle Gell M.D.   On: 07/22/2014 14:56   Dg Hip Unilat With Pelvis 2-3 Views Left  07/22/2014   CLINICAL DATA:  Left hip pain  EXAM: DG HIP (WITH OR WITHOUT PELVIS) 2-3V LEFT  COMPARISON:  None.  FINDINGS: There is no evidence of hip fracture or dislocation. There is no evidence of arthropathy or other focal bone abnormality.  IMPRESSION: Negative.   Electronically Signed   By: Franchot Gallo M.D.   On: 07/22/2014 14:30   Medications: I have reviewed the patient's current medications. Scheduled Meds: . amoxicillin-clavulanate  875 mg Oral Q12H  . aspirin  81 mg Oral Daily  . capsaicin   Topical BID  . cyclobenzaprine  7.5 mg Oral QHS  . gabapentin  600 mg Oral QHS  . levETIRAcetam  500 mg Oral BID  . mometasone-formoterol   2 puff Inhalation BID  . nicotine  21 mg Transdermal Q24H  . pantoprazole  40 mg Oral Daily  . rivaroxaban  20 mg Oral Q supper  . sodium chloride  3 mL Intravenous Q12H   Continuous Infusions: . 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 CK on admission- 32-031. Likely due to fall and subsequent prolonged  immobility. Getting IV fluids. - Considering Hx of diastolic dysfunction, will reduce fluids to 100cc/hr, encourage liberal fluid intake  - Check CK level Am, goal <5000. - Strict in and out.  Falls- MRI head with ? watershed infarct Vs Embolic. Not on BP meds. Orthostatic vitals could not be check as pt could not stand and had gotten 2L already in the ED. - PT eval - Neurology consulted, recs appreciated.  - IVF N/s 100cc/hr - Cont Keppra for Seizure prophylaxis, neurology can determine if and when pt can stop this med. - Percocet- 7.5-325 for pain switch form Q6H to Q4H, also try flexeril and capsaisin cream. - Seizure precautions  CVA- neuro recs at this time is for partial stroke work up with Carotid dopplers first and then further work up from there. - Lipid Panel- LDL- 105 - HgbA1c- pending - Carotid dopplers pending - Echo d/c  Elevated Troponin I - 0.69, peaked at 0.76 and gradually trended down- 0.36. Has no chest pain today, no symptoms referable to ACS and no EKG findings concerning for ACS.  COPD- Wheezing on exam - Cont Home advair as Dulera - Duonebs Q6H Scheduled, no wheezing switch to PRN.  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 UDS positive for opiates only, but had gotten IV dilaudid in the ED. - Cont keppra for seizure ppx - Also Cont amoxicillin- prescribed by ID for 6 weeks, per note written in may, but he was already on med then. Concern was for residual infection per CT head gotten when pt was in  the Ed for seizures.   Pulmonary Embolism- Diagnosed in December 2015. 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  - Not on statin - Cont statin  DVT ppx- On Xarelto for Pulmonary embolism.   Diet/FEN- heart/Carb mod  Dispo: Disposition is deferred at this time, awaiting improvement of current medical problems.  Anticipated discharge in approximately tomorrow.  The patient does have a current PCP (Rushil Sherrye Payor, MD) and does need an [facility] hospital follow-up appointment after discharge.  The patient does not know have transportation limitations that hinder transportation to clinic appointments.  .Services Needed at time of discharge:  Y = Yes, Blank = No PT:   OT:   RN:   Equipment:   Other:     LOS: 2 days   Bethena Roys, MD 07/24/2014, 11:51 AM \

## 2014-07-24 NOTE — Plan of Care (Signed)
Problem: RH SAFETY Goal: RH STG ADHERE TO SAFETY PRECAUTIONS W/ASSISTANCE/DEVICE STG Adhere to Safety Precautions With Assistance/Device.  Outcome: Progressing Pt has been compliant with using walker for ambulation today.  He continues to have PT and OT for training with gait and transfers to increase safety. No falls this shift.  Problem: RH PAIN MANAGEMENT Goal: RH STG PAIN MANAGED AT OR BELOW PT'S PAIN GOAL Outcome: Progressing Pt continues with complaints of chronic pain. Pain is managed with PRN pain medications which keeps pain under control.

## 2014-07-24 NOTE — Progress Notes (Signed)
STROKE TEAM PROGRESS NOTE   HISTORY [name] is an 54 y.o. male with an extensive past medical history including PE now on Xarelto, DMII, HLD, IV heroin abuse with brain abscess (now on ppx amoxicillin), stroke of MCA territory (10/2008)--with visual problems now resolved, tobacco abuse, COPD, PVD, OSA, and carotid stenosis who presented to [facility] after waking up in his bathtub this morning (07/22/2014) with L hip pain, unsure how he got there. He states he was working in the yard all day and felt dehydrated but did not hydrate prior to going to bed around 2000 hours (LKW 07/21/2014 at 2000). He woke up in the bathtub this morning at approximately 7am. He could not get out of the bathtub for several hours due to extreme left hip pain. He states that he crawled out of the bathtub around 10am. He called home health aide who arrived to the house and called EMS. He states he did drink 2 beers that night but denies taking sedatives. He is on Keppra for history of seizure but states he has not had a seizure since May and haas been taking his medications as directed. He also states he has been taking his Xarelto daily. Patient was not administered TPA secondary to on Xeralto and out of window. He was admitted for further evaluation and treatment.   SUBJECTIVE (INTERVAL HISTORY) His student RN is at the bedside.  Overall he feels his condition is stable. "i feel kinda weird in my head", complains he has difficulty walking and that his legs hurt. He tells me that he passed out and felt like he was fainting. He has a complicated history of brain abscesses related to IV drug abuse treated with  aspiration and IV antibiotics. He also has seizures and is on Keppra. He was diagnosed with pulmonary embolism in December 2015 and was supposed to take Xarelto which he has not been compliant with   OBJECTIVE Temp:  [97.5 F (36.4 C)-97.8 F (36.6 C)] 97.7 F (36.5 C) (07/12 0929) Pulse Rate:  [81-85]  85 (07/12 0929) Cardiac Rhythm:  [-] Normal sinus rhythm (07/11 2100) Resp:  [18-20] 20 (07/12 0929) BP: (141-169)/(68-91) 141/70 mmHg (07/12 0929) SpO2:  [98 %-100 %] 100 % (07/12 0533)   Recent Labs Lab 07/23/14 0621 07/24/14 0625  GLUCAP 91 113*    Recent Labs Lab 07/22/14 1256 07/22/14 1935 07/23/14 0715  NA 137  --  136  K 4.6  --  4.4  CL 104  --  108  CO2 25  --  22  GLUCOSE 147*  --  112*  BUN 33*  --  14  CREATININE 1.21  --  0.76  CALCIUM 8.8*  --  8.2*  MG  --  2.1  --    No results for input(s): AST, ALT, ALKPHOS, BILITOT, PROT, ALBUMIN in the last 168 hours.  Recent Labs Lab 07/22/14 1256 07/23/14 0715  WBC 12.5* 7.4  NEUTROABS 10.1*  --   HGB 15.1 13.2  HCT 44.6 39.5  MCV 89.6 89.8  PLT 185 147*    Recent Labs Lab 07/22/14 1256 07/22/14 1935 07/23/14 0145 07/23/14 0715 07/24/14 0308  CKTOTAL 12878*  --   --  12793* 8582*  TROPONINI 0.64* 0.76* 0.54* 0.36*  --    No results for input(s): LABPROT, INR in the last 72 hours.  Recent Labs  07/23/14 0200  COLORURINE YELLOW  LABSPEC 1.016  PHURINE 5.5  GLUCOSEU NEGATIVE  HGBUR LARGE*  BILIRUBINUR NEGATIVE  KETONESUR NEGATIVE  PROTEINUR 30*  UROBILINOGEN 0.2  NITRITE NEGATIVE  LEUKOCYTESUR NEGATIVE       Component Value Date/Time   CHOL 182 07/24/2014 0308   TRIG 176* 07/24/2014 0308   HDL 42 07/24/2014 0308   CHOLHDL 4.3 07/24/2014 0308   VLDL 35 07/24/2014 0308   LDLCALC 105* 07/24/2014 0308   Lab Results  Component Value Date   HGBA1C 4.9 03/13/2014      Component Value Date/Time   LABOPIA POSITIVE* 07/23/2014 0159   LABOPIA NEG 04/12/2014 1136   COCAINSCRNUR NONE DETECTED 07/23/2014 0159   COCAINSCRNUR NEG 04/12/2014 1136   LABBENZ NONE DETECTED 07/23/2014 0159   LABBENZ PPS 04/12/2014 1136   LABBENZ NEG 02/18/2009 2050   AMPHETMU NONE DETECTED 07/23/2014 0159   AMPHETMU NEG 04/12/2014 1136   AMPHETMU NEG 02/18/2009 2050   THCU NONE DETECTED 07/23/2014 0159    THCU NEG 04/12/2014 1136   LABBARB NONE DETECTED 07/23/2014 0159   LABBARB NEG 04/12/2014 1136     Recent Labs Lab 07/22/14 1935  ETH <5    Ct Head Wo Contrast 07/22/2014   New cortical hypodensities in the left occipital region, worrisome for acute infarct.  Residual hypodensity in the subcortical right frontal lobe, reflecting sequela of prior abscess, improved.  Additional abscesses in the subcortical left frontal lobe and right basal ganglia have improved/resolved.    Mr [name] Wo Contrast 07/23/2014    Biparietal, bifrontal and LEFT occipital, LEFT caudate head foci of acute ischemia favoring watershed infarcts, less likely embolic. Chronic LEFT internal carotid artery occlusion.  Old cerebral abscess with faint residual enhancement RIGHT basal ganglia. No new abscess or MR findings of acute intracranial infection on today's examination.     Dg Chest Port 1 View 07/22/2014   Stable exam.  Probable COPD.  No active disease.     Dg Hip Unilat With Pelvis 2-3 Views Left 07/22/2014   Negative.      PHYSICAL EXAM Middle-aged Caucasian male currently not in distress. He is frail and unkempt. . Afebrile. Head is nontraumatic. Neck is supple without bruit.    Cardiac exam soft ejection murmur.no gallop. Lungs are clear to auscultation. Distal pulses are not well felt in both legs.. Neurological Exam ;  Awake  Alert oriented x 3. Normal speech and language.eye movements full without nystagmus.fundi were not visualized. Vision acuity and fields appear normal. Hearing is normal. Palatal movements are normal. Face symmetric  .Marland Kitchen Tongue midline. No drift but mild. weakness of left grip, intrinsic hand muscles, hip and ankle dorsiflexors. . Normal sensation on the right side and strength. Gait was not tested.  ASSESSMENT/PLAN [name] is a 54 y.o. male with history of IV heroin abuse complicated by brain abscesses, seizures, and history of cerebrovascular accident who also has peripheral  arterial disease status post stent placement, diabetes, and anxiety presenting with left hip pain, MRI showed watershed infarct. He did not receive IV t-PA due to being on xarelto and arriving outside the window.   Stroke:  Bilateral watershed appearing infarcts likely secondary to hypoperfusion in setting of chronic L ICA occlusion and dehydration  MRI  Biparietal, bifrontal and LEFT occipital, LEFT caudate head foci of acute ischemia favoring watershed infarcts, less likely embolic. Chronic LEFT internal carotid artery occlusion.  Old cerebral abscess with faint residual enhancement RIGHT basal ganglia.  MRA    Carotid Doppler  pending (hx carotid stenosis)  2D Echo  pending   LDL 105  HgbA1c 4.9  in March 2016  xarelto for VTE prophylaxis Diet heart healthy/carb modified Room service appropriate?: Yes; Fluid consistency:: Thin  xarelto ( rivaroxaban) prior to admission, now on aspirin 81 mg orally every day and xarelto ( rivaroxaban). No indication for additional aspirin from stroke standpoint. Would discontinue due to increased risk of hemorrhage.  Ongoing aggressive stroke risk factor management  Therapy recommendations:  HH OT  Disposition:  pending   Seizure Hx  On Keppra  No seizure since May  Doubt seizure related to this admission  Hypertension Permissive hypertension (OK if < 220/120) but gradually normalize in 5-7 days   Hyperlipidemia  Home meds:  No statin listed  LDL 105, goal < 70  Recommend Addition of statin to meet Joint Commission goal of < 70  Diabetes  HgbA1c 4.9 in March, at goal < 7.0  Controlled   Other Stroke Risk Factors  Cigarette smoker, advised to stop smoking  Hx stroke/TIA - L MCA territory 10/2008  Obstructive sleep apnea, not compliant with CPAP at home  PVD  Carotid stenosis  Other Pertinent History  Hepatitis C  Hx MRSA infection  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 [name] discussed  diagnosis, prognosis,  treatment options and plan of care with medical student.    Hospital day # [facility] for Pager information 07/24/2014 1:56 PM  I have personally examined this patient, reviewed notes, independently viewed imaging studies, participated in medical decision making and plan of care. I have made any additions or clarifications directly to the above note. Agree with note above. He presented with passing out and is brought to 4n adolescent confusion. MRI scan shows bilateral watershed infarcts. Patient has known history of left carotid occlusion and 50% right ICA stenosis and hence he is at increased risk for progression of carotid occlusive disease, neurological worsening, recurrent stroke and TIAs. Continue ongoing stroke evaluation and check carotid ultrasound. Maintain adequate blood pressure and hydration status. Patient counseled to quit smoking, alcohol, drugs and maintain good hydration status.  Antony Contras, MD Medical Director [facility] Pager: [phone] 07/24/2014 2:06 PM    To contact Stroke Continuity provider, please refer to [email or web address] After hours, contact General Neurology

## 2014-07-25 ENCOUNTER — Inpatient Hospital Stay (HOSPITAL_COMMUNITY): Payer: Medicaid Other

## 2014-07-25 DIAGNOSIS — M25552 Pain in left hip: Secondary | ICD-10-CM | POA: Insufficient documentation

## 2014-07-25 DIAGNOSIS — I639 Cerebral infarction, unspecified: Secondary | ICD-10-CM

## 2014-07-25 DIAGNOSIS — I6789 Other cerebrovascular disease: Secondary | ICD-10-CM

## 2014-07-25 DIAGNOSIS — M6282 Rhabdomyolysis: Secondary | ICD-10-CM | POA: Insufficient documentation

## 2014-07-25 LAB — GLUCOSE, CAPILLARY
GLUCOSE-CAPILLARY: 99 mg/dL (ref 65–99)
GLUCOSE-CAPILLARY: 108 mg/dL — AB (ref 65–99)

## 2014-07-25 LAB — HEMOGLOBIN A1C
Hgb A1c MFr Bld: 6 % — ABNORMAL HIGH (ref 4.8–5.6)
Mean Plasma Glucose: 126 mg/dL

## 2014-07-25 LAB — CK: Total CK: 3851 U/L — ABNORMAL HIGH (ref 49–397)

## 2014-07-25 NOTE — Discharge Summary (Signed)
Name: [name] MRN: [id number] DOB: [birth year] 54 y.o. PCP: [name] Dubin, [name]  Date of Admission: 07/22/2014 12:04 PM Date of Discharge: 07/25/2014 Attending Physician: Dr [name]  Discharge Diagnosis:  Principal Problem:   Rhabdomyolysis Active Problems:   Type 2 diabetes mellitus   HYPERTENSION, BENIGN ESSENTIAL   Peripheral vascular disease   COPD (chronic obstructive pulmonary disease)   Depression   Brain abscess, Hx of   Pulmonary embolism   Fall at home   Cerebral embolism with cerebral infarction   Non-traumatic rhabdomyolysis   Left hip pain   Acute cerebrovascular accident  Discharge Medications:   Medication List    STOP taking these medications        ondansetron 4 MG tablet  Commonly known as:  ZOFRAN      TAKE these medications        albuterol (2.5 MG/3ML) 0.083% nebulizer solution  Commonly known as:  PROVENTIL  Take 2.5 mg by nebulization every 6 (six) hours as needed for wheezing or shortness of breath.     amoxicillin 875 MG tablet  Commonly known as:  AMOXIL  Take 1 tablet (875 mg total) by mouth 2 (two) times daily.     clonazePAM 0.5 MG tablet  Commonly known as:  KLONOPIN  Take 1 tablet (0.5 mg total) by mouth daily.     Fluticasone-Salmeterol 100-50 MCG/DOSE Aepb  Commonly known as:  ADVAIR DISKUS  Inhale 1 puff into the lungs 2 (two) times daily.     HORIZANT 600 MG Tbcr  Generic drug:  Gabapentin Enacarbil  Take 600 mg by mouth 2 (two) times daily.     hydrOXYzine 25 MG tablet  Commonly known as:  ATARAX/VISTARIL  Take 1 tablet (25 mg total) by mouth 2 (two) times daily as needed for itching.     levETIRAcetam 500 MG tablet  Commonly known as:  KEPPRA  Take 1 tablet (500 mg total) by mouth 2 (two) times daily.     nicotine 7 mg/24hr patch  Commonly known as:  NICODERM CQ - dosed in mg/24 hr  Place 1 patch (7 mg total) onto the skin daily.     oxyCODONE-acetaminophen 5-325 MG per tablet  Commonly known as:   PERCOCET/ROXICET  Take 1-2 tablets by mouth every 6 (six) hours as needed for severe pain.     pantoprazole 40 MG tablet  Commonly known as:  PROTONIX  TAKE ONE (1) TABLET BY MOUTH EVERY DAY     PARoxetine 20 MG tablet  Commonly known as:  PAXIL  TAKE ONE (1) TABLET BY MOUTH EVERY DAY     ranitidine 150 MG tablet  Commonly known as:  ZANTAC  Take 1 tablet (150 mg total) by mouth 2 (two) times daily as needed for heartburn.     XARELTO 20 MG Tabs tablet  Generic drug:  rivaroxaban  TAKE ONE (1) TABLET BY MOUTH EVERY DAY WITH SUPPER        Disposition and follow-up:   [name] was discharged from [facility] in Good condition.  At the hospital follow up visit please address:  1. Modifiable [name] risk factors including smoking, cigarette use, OSA and non-compliance with CPAP, hyperlipidemia, and diabetes; HTN; need for follow-up with Dr. Carlyle [name] (ID) to discuss continuing prophylactic amoxicillin and follow up on when to D/c seizure ppx with Keppra. Follow up on Dietary modification and exercise for Pre- DM.  Pt will need to be started on a statin,  considering his hx of strokes, Carotid artery disease and Peripheral vascular disease. Not started on discharge due to Rhabdomyolysis.  2. Labs / imaging needed at time of follow-up: None  3. Pending labs/ test needing follow-up: none   Follow-up Appointments: Follow-up Information    Follow up with [name] Maxin, DO On 07/31/2014.   Specialty:  Internal Medicine   Why:  at 9:45am   Contact information:   [name] [id number] [phone]      Discharge Instructions: Discharge Instructions    Diet - low sodium heart healthy    Complete by:  As directed      Increase activity slowly    Complete by:  As directed           Consultations: Treatment Team:  [name] Stroke, [name]  Procedures Performed:  Dg Chest 2 View  07/01/2014   CLINICAL DATA:  Recent chest trauma with known rib  fractures ; pain. Current rectal bleeding.  EXAM: CHEST  2 VIEW  COMPARISON:  Chest and ribs June 23, 2014 ; chest radiograph February 14, 2014  FINDINGS: The recent fractures involving the anterior right seventh and eighth ribs are less well seen but present. There is old rib trauma on the left, stable. No pneumothorax or effusion. There is mild scarring in the right base, stable. Lungs elsewhere are clear. Heart size and pulmonary vascularity are normal. No adenopathy. There is postoperative change in the lower cervical spine.  IMPRESSION: No recent rib fractures on the right, better seen on recent prior study. Old rib trauma on the left. No effusion or pneumothorax apparent. Scarring right base. No edema or consolidation. No change in cardiac silhouette.   Electronically Signed   By: [name] Grip III M.D.   On: 07/01/2014 17:18   Ct Head Wo Contrast  07/22/2014   CLINICAL DATA:  Loss of consciousness, history of brain infection  EXAM: CT HEAD WITHOUT CONTRAST  TECHNIQUE: Contiguous axial images were obtained from the base of the skull through the vertex without intravenous contrast.  COMPARISON:  05/22/2014 and 03/27/2014  FINDINGS: No evidence of parenchymal hemorrhage or extra-axial fluid collection.  Residual hypodensity in the subcortical right frontal lobe with central hyperdensity (series 2/ image 25), reflecting sequela of prior abscess, improved.  Additional abscesses in the subcortical left frontal lobe and right basal ganglia have improved/resolved.  New cortical hypodensities in the left occipital region (series 2/ images 13 and 17), worrisome for acute infarct.  No mass effect or midline shift.  Subcortical white matter and periventricular small vessel ischemic changes. Intracranial atherosclerosis.  Cerebral volume is within normal limits.  No ventriculomegaly.  The visualized paranasal sinuses are essentially clear. The mastoid air cells are unopacified.  No evidence of calvarial fracture.   IMPRESSION: New cortical hypodensities in the left occipital region, worrisome for acute infarct.  Residual hypodensity in the subcortical right frontal lobe, reflecting sequela of prior abscess, improved.  Additional abscesses in the subcortical left frontal lobe and right basal ganglia have improved/resolved.  These results were called by telephone at the time of interpretation on 07/22/2014 at 5:43 pm to Dr. Betsey Holiday, who verbally acknowledged these results.   Electronically Signed   By: Julian Hy M.D.   On: 07/22/2014 17:45   [name] IO Contrast  07/23/2014   CLINICAL DATA:  Syncopal episodes, woke up in bathtub this morning. History of seizures, substance abuse, brain abscess.  EXAM: MRI HEAD WITHOUT AND WITH CONTRAST  TECHNIQUE: Multiplanar,  multiecho pulse sequences of the brain and surrounding structures were obtained without and with intravenous contrast.  CONTRAST:  66mL MULTIHANCE GADOBENATE DIMEGLUMINE 529 MG/ML IV SOLN  COMPARISON:  CT head July 22, 2014 at 1728 hours and MRI of the brain December 01, 2013  FINDINGS: Multiple foci of reduced diffusion within the LEFT greater than RIGHT occipital lobes, measuring up to 12 mm with rounded configuration, corresponding low ADC values. In addition, LEFT caudate head, bilateral bifrontal subcentimeter foci of reduced diffusion. No superimposed abnormal enhancement. Subcentimeter focus of susceptibility artifact RIGHT basal ganglia, RIGHT centrum semiovale, LEFT frontal vertex at site of prior abscesses. No midline shift, mass effect or mass lesions on today's examination. 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 Mild encephalomalacia RIGHT basal ganglia, RIGHT corona radiata and LEFT frontal vertex corresponding to prior abscess. Faint enhancement RIGHT basal ganglia at site of prior abscess.  No abnormal extra-axial fluid collections, extra-axial enhancement nor extra-axial masses. Stable loss of LEFT internal carotid artery flow void from the level  of the cervical internal carotid artery, to the cavernous segment, flow void LEFT supra clinoid internal carotid artery suggesting retrograde flow.  Trace paranasal sinus mucosal thickening without air-fluid levels. Ocular globes and orbital contents are nonsuspicious though not tailored for evaluation.  No abnormal sellar expansion. No cerebellar tonsillar ectopia. No suspicious calvarial bone marrow signal.  IMPRESSION: Biparietal, bifrontal and LEFT occipital, LEFT caudate head foci of acute ischemia favoring watershed infarcts, less likely embolic. Chronic LEFT internal carotid artery occlusion.  Old cerebral abscess with faint residual enhancement RIGHT basal ganglia. No new abscess or MR findings of acute intracranial infection on today's examination.   Electronically Signed   By: Elon Alas M.D.   On: 07/23/2014 02:55   Dg Chest Port 1 View  07/22/2014   CLINICAL DATA:  Elevated cardiac enzymes. Peripheral vascular disease.  EXAM: PORTABLE CHEST - 1 VIEW  COMPARISON:  07/01/14  FINDINGS: Heart size is within normal limits. Both lungs are clear. No evidence of pneumothorax or pleural effusion. Mild hyperinflation again noted, suspicious for COPD. Old left clavicle and rib fracture deformities again noted.  IMPRESSION: Stable exam.  Probable COPD.  No active disease.   Electronically Signed   By: Earle Gell M.D.   On: 07/22/2014 14:56   Dg Hip Unilat With Pelvis 2-3 Views Left  07/22/2014   CLINICAL DATA:  Left hip pain  EXAM: DG HIP (WITH OR WITHOUT PELVIS) 2-3V LEFT  COMPARISON:  None.  FINDINGS: There is no evidence of hip fracture or dislocation. There is no evidence of arthropathy or other focal bone abnormality.  IMPRESSION: Negative.   Electronically Signed   By: Franchot Gallo M.D.   On: 07/22/2014 14:30    2D Echo: EF- 50-55% Study Conclusions  - Left ventricle: The cavity size was normal. Wall thickness was normal. Systolic function was normal. The estimated ejection fraction  was in the range of 50% to 55%. Wall motion was normal; there were no regional wall motion abnormalities. Doppler parameters are consistent with abnormal left ventricular relaxation (grade 1 diastolic dysfunction). - Aortic valve: Valve mobility was restricted. - Mitral valve: Moderately calcified annulus. - Tricuspid valve: There was moderate regurgitation.  Cardiac Cath: None  Admission HPI: Chief Complaint: Falls   History of Present Illness: 66 Y O M with PMH- Seizures, Pulmonary Embolism- diagnosed December 2016, IV drug abuse- Heroin, with Brain Abscess, PAD s/p stent placement- most recent- 03/2014, Anxiety, hep C, [name], Type 2 DM, OSA, Dyslipidemia. Pt  presented with complainst of falls and passing and waking up in the bathtub this am. Pt does not know how he got there. Last he remembers is falling asleep at about 11pm last night, he lives alone.  He thinks he remembers waking up at about 5am thinking he needs to use the bathroom, but does not remember getting up. He says he woke up in the bath tub at about 7am this morning and could not climb out of the tub due to pain in his left hip. HE lay there for about 3-4 hrs, finally was able to get to his bed and then called his aide. He cannot remember hitting his head, does not think he had a seizure, denies prior or present headaches, he denies recent dizziness, fevers or chills, denies having prior or current chest pain, arm pain, neck pain, SOB, diarrhea, vomiting, endorses increased PO intake, denies urinary symptoms.  He endorses chronic non productive cough. He says his last seizure was about a month ago, but per chart he denied tongue bitting, urinary or fecal incontinence, and says he was aware the whole time it happened. HE takes- 1-2 beers per week, says he has not used heroine in 6 months, since he was at the SNF, smokes 1- 1 and a half Pack of cigs per week. He is aware of all the meds he takes and denies taking Opioids or  benzos recently, also been compliant with keppra.  Physical Exam: Blood pressure 130/74, pulse 87, temperature 98.1 F (36.7 C), temperature source Oral, resp. rate 16, height 6\' 1"  (1.854 m), weight 160 lb (72.576 kg), SpO2 95 %. GENERAL- alert, co-operative, appears as stated age, not in any distress. HEENT- Atraumatic, normocephalic, PERRL, neck supple, oral mucosa very dry CARDIAC- RRR, no murmurs, rubs or gallops. RESP- Moving equal volumes of air, has diffuse expiratory wheezes, no crackles. ABDOMEN- Soft, nontender, no guarding or rebound, bowel sounds present. BACK- Normal curvature of the spine, No tenderness along the vertebrae, no CVA tenderness. NEURO- Cranial Nerves 2-12 mostly intact except for difficulty blowing out left check, lower extremities- 5/5, Sensation intact- globally, able to move right leg without difficulty, but not the left leg, says this is due to pain.  EXTREMITIES- Pulse hard to palpate- DP, but warm, pulse 2+ PSYCH- Normal mood and affect, appropriate thought content and speech appears slurred but normal for pt.  Hospital Course by problem list:   1. Rhabdomyolysis-Likely due to prolonged immobilization, from fall and subsequent loss [place]. [place] [address]. No evidence of intoxication, or seizures- Says he was complaint with Keppra. Work up including BMET- Cr- 1.2- without evidence of kidney damage during admission, and remained at baseline, elevated Troponin- 0.64- which can be seen with skeletal muscle injury(- See below), UDS positive for Opiates only- though patient had already been given opioids in the ED, alcohol levels- <5. Patient was hydrated with IVF Normal saline with daily checks of CK levels, with gradual drop to 3851 by the day of discharge.  2. Cerebrovascular Accident- Pt had Ct scan done considering hx of fall, patient had no deficits on exam or during admission. CT scan obtained was concerning for Acute infarct. MRI was  subsequently obtained- 07/23/2014 which showed biparietal, bifrontal and left occipital, left caudate head foci of acute ischemia favoring watershed infarcts, less likely embolic. Dr. Antony Contras (neurology) reviewed case and was of the opinion that infarcts were likely watershed in nature and secondary to hypoperfusion in setting of chronic L ICA occlusion  and dehydration- history of prolonged sun exposure. Carotid doppler report (07/25/14) was significant for right carotid 1-39% internal carotid artery stenosis and total left carotid internal carotid artery occlusion- which are both unchanged compared to prior- Dopplers- 2014. TTE was obtained and showed no source of cardiac emboli or significant interval change from the previous echocardiogram. Aspirin was discontinued in the setting of rivaroxaban and increased risk of hemorrhage. Patient counseled to quit smoking, alcohol, drugs and maintain good hydration status. Pt was discharge with close follow up with PCP- Address starting Statin- not started on admission considering Rhabdomyolysis, pre-diabetes A1c- 6 with conservative measures. Physical therapy assessed patient and recommneded Home Health PT which patient declined.  3. Left hip pain. Left hip x-ray showed no evidence of hip fracture, dislocation, evidence of arthropathy or other focal bone abnormality. Patient was given oxycodone-acetaminophen 5-325mg  + 2.5mg  oxycodone Q6H, flexeril 7.5mg  daily, and capsaicin cream BID during hospitalization. At time of discharge, patient was tender to palpation over L hip improvement in slight bruising or swelling that was present. Narcotics were not prescribed at discharge given concern about patient's history of heroin abuse and medication abuse. Patient to follow-up with outpatient provider regarding further pain management.   4.  Elevated troponin I- of 0.64.  No symptoms referable to ACS, EKG unremarkable. Overnight troponins peaked at 0.76 and trended downward  to 0.36. Notably, per retrospective cohort study of ED patients, the prevalence of false-positive cardiac troponin I in ED patients with rhabdomyolysis is 17% [facility]let[facility] et al., Am J Emerg Med, 2005, 23(7): 860-3). Elevated troponin likely secondary to rhabdomyolysis.   5. History of brain abscess s/p completed antibiotic course. MRI on 07/23/14 showed old cerebral abscess with faint residual enhancement right basal ganglia. Patient last saw Dr. Carlyle [name] (ID) on 06/04/2014. At that time, plan was to continue prophylactic amoxicillin for an additional 6 weeks and to continue Keppra with RTC in 6 to 8 weeks. Pt to follow up with [facility] and Infectious dx.   Discharge Vitals:   BP 153/65 mmHg  Pulse 106  Temp(Src) 97.5 F (36.4 C) (Oral)  Resp 18  Ht 6\' 1"  (1.854 m)  Wt 160 lb (72.576 kg)  BMI 21.11 kg/m2  SpO2 93%  Discharge Labs:  No results found for this or any previous visit (from the past 24 hour(s)).  Signed: Bethena Roys, [name] 07/26/2014, 1:39 PM   Services Ordered on Discharge: None Equipment Ordered on Discharge: None
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
## 2014-07-25 NOTE — Progress Notes (Signed)
S: No events overnight.  He states his hip pain continues to improve and he is able to move around much more easily w/o as much pain as previously.  O: Physical Exam:  Filed Vitals:   07/24/14 2219 07/25/14 0519 07/25/14 0838 07/25/14 0921  BP: 167/85 155/83 153/65   Pulse: 88 84 106   Temp: 97.6 F (36.4 C) 97.5 F (36.4 C) 97.5 F (36.4 C)   TempSrc: Oral Oral Oral   Resp: 18 17 18    Height:      Weight:      SpO2: 96% 99% 99% 93%   Gen.: Well-developed, well-nourished, man lying comfortably in bed in no acute distress. He is able to move around the bed much more easily with less distress secondary to pain than previously in this admission. Left hip: No visible hematoma, no swelling, much less indurated and tender to palpation than previously.  Lab results:  Cardiac Enzymes:  Recent Labs  07/22/14 1935 07/23/14 0145 07/23/14 0715 07/24/14 0308 07/25/14 0316  CKTOTAL  --   --  89373* 8582* 3851*  TROPONINI 0.76* 0.54* 0.36*  --   --    Echocardiogram: No significant interval change from the previous echocardiogram. No source of cardiac emboli.  Carotid Dopplers: (Preliminary report) right carotid 1-39% internal carotid artery stenosis, left carotid occluded internal carotid artery, vertebral artery flow is anterograde bilaterally.  Assessment:  [name] is a 54 year old man who presents with a watershed infarct likely secondary to dehydration from being in the hot sun the day before. Somehow he ended up in the bathtub overnight and developed rhabdomyolysis. Stroke evaluation has been unremarkable and his rhabdomyolysis has responded well to intravenous fluid hydration with a CK level now less than 3900. Fortunately, he had no kidney complications from the rhabdomyolysis.  Plan:  1) Watershed CVA: We will continue with his rivaroxaban. We have discontinued the aspirin in the setting of the rivaroxaban.  2) Rhabdomyolysis: IV fluids have been discontinued and he is  stable for discharge home.  3) Disposition: He will be discharged home today with follow-up in his primary care provider's office.

## 2014-07-25 NOTE — Progress Notes (Signed)
Physical Therapy Treatment Patient Details Name: [name] MRN: [id number] DOB: Feb 19, 1960 Today's Date: 07/25/2014    History of Present Illness 54 y.o. male with an extensive past medical history including PE now on Xarelto, DMII, HLD, IV heroin abuse with brain abscess (now on ppx amoxicillin), stroke of MCA territory (10/2008)--with visual problems now resolved, tobacco abuse, COPD, PVD, OSA, and carotid stenosis who presented to [facility] after waking up in his bathtub this morning (07/22/2014) with L hip pain, unsure how he got there. +rhabdomyolysis MRI head + watershed CVAs (bifrontal, biparietal, Lt occipital; doppler Lt internal carotid occluded    PT Comments    Patient with significantly decreased Lt hip pain and walking in room without RW with no loss of balance. Performed most of [place] Balance Assessment without loss of balance (incomplete due to transport arrived to take pt to vascular study). Pt eager to return home (noted prior history and poor medical compliance). Feel pt mobilizing well enough to return home. Recommend HHPT for safety evaluation, however pt likely will refuse.   Follow Up Recommendations  Home health PT;Supervision - Intermittent     Equipment Recommendations  None recommended by PT    Recommendations for Other Services       Precautions / Restrictions Precautions Precautions: Fall Restrictions Weight Bearing Restrictions: No    Mobility  Bed Mobility                  Transfers Overall transfer level: Modified independent Equipment used: None Transfers: Sit to/from Stand Sit to Stand: Modified independent (Device/Increase time)         General transfer comment: no device, no imbalance, incr time/effort  Ambulation/Gait Ambulation/Gait assistance: Min guard Ambulation Distance (Feet): 25 Feet Assistive device: None Gait Pattern/deviations: Step-through pattern;Decreased stance time - left;Decreased stride  length;Wide base of support;Trunk flexed Gait velocity: slower   General Gait Details: pt up in room independently on arrival; agreed to ambulate with PT (transport arrived to take pt to vascular lab and session completed in pt's room)   Stairs            Wheelchair Mobility    Modified Rankin (Stroke Patients Only)       Balance           Standing balance support: No upper extremity supported Standing balance-Leahy Scale: Good Standing balance comment: states hip much better and denied pain                    Cognition Arousal/Alertness: Awake/alert Behavior During Therapy: Anxious Overall Cognitive Status: No family/caregiver present to determine baseline cognitive functioning                      Exercises      General Comments General comments (skin integrity, edema, etc.): Pt later observed walking in hall with nursing with RW with no loss of balance (248ft)      Pertinent Vitals/Pain Pain Assessment: No/denies pain    Home Living                      Prior Function            PT Goals (current goals can now be found in the care plan section) Acute Rehab PT Goals Patient Stated Goal: go home Time For Goal Achievement: 08/07/14 Progress towards PT goals: Progressing toward goals    Frequency  Min 2X/week    PT Plan Discharge plan  needs to be updated    Co-evaluation             End of Session   Activity Tolerance: Patient tolerated treatment well Patient left: in chair;Other (comment) (in wheelchair with transporter)     Time: 5830-9407 PT Time Calculation (min) (ACUTE ONLY): 9 min  Charges:  $Gait Training: 8-22 mins                    G Codes:      Finola Rosal 07/30/14, 12:09 PM Pager [phone]

## 2014-07-25 NOTE — Progress Notes (Signed)
VASCULAR LAB PRELIMINARY  PRELIMINARY  PRELIMINARY  PRELIMINARY  Carotid duplex  completed.    Preliminary report:  Right:  1-39% ICA stenosis.  Left:  Occluded internal carotid artery.  Bilateral:  Vertebral artery flow is antegrade.     Monika Chestang, RVT 07/25/2014, 10:14 AM

## 2014-07-25 NOTE — Progress Notes (Signed)
  Echocardiogram 2D Echocardiogram has been performed.  Darlina Sicilian M 07/25/2014, 9:22 AM

## 2014-07-30 ENCOUNTER — Telehealth: Payer: Self-pay

## 2014-07-30 NOTE — Telephone Encounter (Signed)
Phone call from pt.  Reported a burning in bottom of his feet that "feels like someone is holding a torch to them."  Also c/o burning in his ankles.  Reported the burning can be at rest, and with walking. Questioned if anything makes him feel better;  stated "Percocet makes it better."  Discussed the Pain management clinic with pt.  Stated "I'm not going back there; they wanted to give me an injection in my back, and there's nothing wrong with my back."  Denied any open sores of lower extremities.  Questioned about change in color or temperature of lower extremities; reported "sometimes my feet turn white at night."  Questioned if he had any change or decrease in mobility; stated "I can't hardly go up steps; I had a double stroke the other night and was in the ER."  Stated he has a hospital f/u with his PCP tomorrow.  Advised to discuss symptoms with his PCP to determine if symptoms can be medically managed.

## 2014-07-31 ENCOUNTER — Other Ambulatory Visit: Payer: Self-pay | Admitting: Internal Medicine

## 2014-07-31 ENCOUNTER — Ambulatory Visit (INDEPENDENT_AMBULATORY_CARE_PROVIDER_SITE_OTHER): Payer: Medicaid Other | Admitting: Internal Medicine

## 2014-07-31 ENCOUNTER — Telehealth: Payer: Self-pay | Admitting: Internal Medicine

## 2014-07-31 ENCOUNTER — Encounter: Payer: Self-pay | Admitting: Internal Medicine

## 2014-07-31 VITALS — BP 130/71 | HR 101 | Temp 98.2°F | Ht 73.0 in | Wt 153.8 lb

## 2014-07-31 DIAGNOSIS — I739 Peripheral vascular disease, unspecified: Secondary | ICD-10-CM | POA: Diagnosis not present

## 2014-07-31 DIAGNOSIS — M6282 Rhabdomyolysis: Secondary | ICD-10-CM

## 2014-07-31 DIAGNOSIS — F1120 Opioid dependence, uncomplicated: Secondary | ICD-10-CM | POA: Diagnosis not present

## 2014-07-31 DIAGNOSIS — I639 Cerebral infarction, unspecified: Secondary | ICD-10-CM

## 2014-07-31 DIAGNOSIS — E118 Type 2 diabetes mellitus with unspecified complications: Secondary | ICD-10-CM

## 2014-07-31 DIAGNOSIS — G060 Intracranial abscess and granuloma: Secondary | ICD-10-CM

## 2014-07-31 DIAGNOSIS — Z8673 Personal history of transient ischemic attack (TIA), and cerebral infarction without residual deficits: Secondary | ICD-10-CM

## 2014-07-31 DIAGNOSIS — F17200 Nicotine dependence, unspecified, uncomplicated: Secondary | ICD-10-CM

## 2014-07-31 LAB — GLUCOSE, CAPILLARY: Glucose-Capillary: 104 mg/dL — ABNORMAL HIGH (ref 65–99)

## 2014-07-31 MED ORDER — RANITIDINE HCL 150 MG PO TABS: 150.0000 mg | ORAL_TABLET | Freq: Two times a day (BID) | ORAL | Status: DC | PRN

## 2014-07-31 NOTE — Assessment & Plan Note (Addendum)
No deficits on exam.  No new weakness or recent falls.  - will not start statin today since he is recovering from rhabdo; advised he follow-up with PCP to consider addition of this med in the near future - advised smoking cessation but he says he is not interested in quitting - A1c 6.0 (at goal) and advised to continue dietary modification (he is not on DM medications) - BP at goal - [facility] PT recommended during admission but he declined - he has brought paperwork to increase [facility] services (he currently gets 2.5 hours 7 days per week); I have put the papers in PCP mailbox to be completed.

## 2014-07-31 NOTE — Telephone Encounter (Signed)
Tried to rtc, lm for tiffany kellam to rtc to [phone] and ask for triage nurse
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
## 2014-07-31 NOTE — Telephone Encounter (Signed)
As this patient was seen by Dr. Genene Churn on 09/26/13 and deemed necessary for their treatment as documented in the EHR, I will refill this prescription for Advair

## 2014-07-31 NOTE — Telephone Encounter (Signed)
Spoke to [employer] re: pain management, read dr wilson's note from visit today, states pt wants a new pain clinic, she is going to speak w/ him again and see if he will continue w/ current pain clinic

## 2014-07-31 NOTE — Assessment & Plan Note (Signed)
Feels better since discharge.  Appetite normal, no urinary problems or hematuria.  His primary c/o today is chronic B/L leg pain due to PVD s/p bypass and he scheduled for follow-up with Dr. Donnetta Hutching in two weeks.

## 2014-07-31 NOTE — Telephone Encounter (Signed)
I will refill this prescription for Zantac though will modify the instructions so he is taking it only as needed.

## 2014-07-31 NOTE — Telephone Encounter (Signed)
Tiffany is callin helen back

## 2014-07-31 NOTE — Telephone Encounter (Signed)
Calling to speak with the nurse states patient wants to change pain clinics. Also wants to know what all is being done for the patient for pain

## 2014-07-31 NOTE — Telephone Encounter (Signed)
rec'd phone call from Nurse Case Mgr. with [facility].  Left voice message stating that the pt. Reported the nurse he talked to on 7/18 told him that the office would call back to schedule an appt with Dr. Donnetta Hutching.  Pt. has appt. for ABI's and Right LE Arterial Duplex on 08/07/14, and f/u appt. with Dr. Donnetta Hutching on 08/14/14.  Per conversation with pt. 7/18, the pt. reported he had burning in feet and ankles, and was advised to discuss symptoms @ appt. With PCP on 7/19, and determine if symptoms can be treated medically, or if the PCP felt pt. should see Vasc. Surgery sooner.  Gave update to Dr. Donnetta Hutching today, re: pt's complaints; recommended to keep the appts. on 7/26, and 8/2.   Left message with Jonelle Sidle, nurse case mgr with [facility] re: Dr. Luther Parody recommendation.

## 2014-07-31 NOTE — Patient Instructions (Signed)
1. I am checking with Dr. Luther Parody office to see if we can move your appointment up.  You can try over-the-counter pain medications for now but I would recommend returning to your pain management clinic.  We will call Dr. Crissie Figures office to arrange your follow-up appointment.  I will have Dr. Posey Pronto complete your application to increase [facility] services in your home.    2. Please take all medications as prescribed.    3. If you have worsening of your symptoms or new symptoms arise, please call the clinic ([phone]), or go to the ER immediately if symptoms are severe.   Please come back to see Dr. Posey Pronto in 1 month.

## 2014-07-31 NOTE — Assessment & Plan Note (Signed)
He reports compliance with amoxicillin.  Due for follow-up with Dr. Graylon Good this month.  This appointment was scheduled for 08/02/14.

## 2014-07-31 NOTE — Telephone Encounter (Signed)
I will refill this prescription for Xarelto. He was hospitalized in December 2015 for acute PE provoked  at which time it was noted that he has had prior PEs and should be on lifelong anticoagulation. On 06/02/2006, he was admitted for R ischemic foot and underwent thrombolysis of R femoral-popliteal artery bypass with IV heparin. He was transitioned to warfarin for indefinite anticoagulation given the extent of his peripheral vascular disease by Dr. Curt Jews. Factor V Leiden and anti-cardiolipin Ab were negative.   Atarax has been on his med list as far back as 2011. Dr. Newt Lukes noted on 03/03/2012 that he was prescribed this medication for itching though unclear why he has been on this medication for so long. As such, I will not be refilling it.

## 2014-07-31 NOTE — Assessment & Plan Note (Signed)
Chronic B/L leg pain and he is requesting something for pain.  Review of narcotics database reveals opioids rx by multiple providers and filled at different pharmacies.  There is one rx for Embeda (morphine and naltrexone) prescribed on 06/22/14 by Dr. Balinda Quails (pain management physician).  [name] says he stopped taking Embeda because he read ADRs and was concerned for seizure risk and his hx of brain abscess.  He says he has used morphine before without problem but is concerned about other med in [place].  He says he does not have any opiates at home for pain (this may be true as last fill per [place] database was for oxy-acet #15 on 07/02/14).  I will not prescribe narcotics to him given hx of heroine abuse (he says he has not used heroine in 6 months and that development of brain abscesses "scared" him clean), red flag behavior (different prescribers and pharmacies) and he was referred to pain management and should get all narcotics at that provider.  He tells me he stopped going to pain management because he "just wants to put needles in your back."  He says he has not been dismissed from current pain management clinic.  I informed him that he will have to abide by contract with pain management doctor and that he should contact their office.   - no narcotic rx from [facility] - advised to return to pain management - he can use OTC pain meds for now - advised smoking cessation but he says he is not interested in quitting - called to Dr. Luther Parody office to try and move up appointment but for now it is in two weeks on 08/14/14; he is scheduled for ABI and bypass graft duplex next week - RTC in 1 month for follow-up with PCP

## 2014-08-02 ENCOUNTER — Other Ambulatory Visit: Payer: Self-pay

## 2014-08-02 ENCOUNTER — Encounter: Payer: Self-pay | Admitting: Internal Medicine

## 2014-08-02 ENCOUNTER — Encounter: Payer: Self-pay | Admitting: Vascular Surgery

## 2014-08-02 ENCOUNTER — Ambulatory Visit (INDEPENDENT_AMBULATORY_CARE_PROVIDER_SITE_OTHER): Payer: Medicaid Other | Admitting: Vascular Surgery

## 2014-08-02 ENCOUNTER — Telehealth: Payer: Self-pay

## 2014-08-02 ENCOUNTER — Ambulatory Visit (INDEPENDENT_AMBULATORY_CARE_PROVIDER_SITE_OTHER): Payer: Medicaid Other | Admitting: Internal Medicine

## 2014-08-02 ENCOUNTER — Ambulatory Visit (INDEPENDENT_AMBULATORY_CARE_PROVIDER_SITE_OTHER)
Admission: RE | Admit: 2014-08-02 | Discharge: 2014-08-02 | Disposition: A | Discharge status: Home / Self Care | Payer: Medicaid Other | Source: Ambulatory Visit | Attending: Vascular Surgery | Admitting: Vascular Surgery

## 2014-08-02 ENCOUNTER — Ambulatory Visit (HOSPITAL_COMMUNITY)
Admission: RE | Admit: 2014-08-02 | Discharge: 2014-08-02 | Disposition: A | Discharge status: Home / Self Care | Payer: Medicaid Other | Source: Ambulatory Visit | Attending: Vascular Surgery | Admitting: Vascular Surgery

## 2014-08-02 VITALS — BP 129/78 | HR 83 | Temp 97.8°F | Ht 73.0 in | Wt 154.0 lb

## 2014-08-02 VITALS — BP 131/79 | HR 101 | Temp 98.2°F | Wt 155.0 lb

## 2014-08-02 DIAGNOSIS — M7989 Other specified soft tissue disorders: Secondary | ICD-10-CM | POA: Diagnosis not present

## 2014-08-02 DIAGNOSIS — I70209 Unspecified atherosclerosis of native arteries of extremities, unspecified extremity: Secondary | ICD-10-CM

## 2014-08-02 DIAGNOSIS — M79605 Pain in left leg: Secondary | ICD-10-CM | POA: Insufficient documentation

## 2014-08-02 DIAGNOSIS — Z01810 Encounter for preprocedural cardiovascular examination: Secondary | ICD-10-CM | POA: Diagnosis not present

## 2014-08-02 DIAGNOSIS — R894 Abnormal immunological findings in specimens from other organs, systems and tissues: Secondary | ICD-10-CM

## 2014-08-02 DIAGNOSIS — G060 Intracranial abscess and granuloma: Secondary | ICD-10-CM

## 2014-08-02 DIAGNOSIS — I739 Peripheral vascular disease, unspecified: Secondary | ICD-10-CM

## 2014-08-02 DIAGNOSIS — R768 Other specified abnormal immunological findings in serum: Secondary | ICD-10-CM

## 2014-08-02 MED ORDER — OXYCODONE-ACETAMINOPHEN 5-325 MG PO TABS: 1.0000 | ORAL_TABLET | Freq: Four times a day (QID) | ORAL | Status: DC | PRN
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
## 2014-08-02 NOTE — Telephone Encounter (Signed)
Phone call from pt.  Reported increased pain in left lower extremity "just above the knee".  Reported swelling in the lower leg from ankle to knee.  Reported left foot color turns "white" and feels cold at times.  Stated he can't walk more than a few steps, and he has to stop due to the pain in the left upper leg.  Stated the pain eases with rest.  Denied any redness or warmth of the left leg.  Discussed with Dr. Donnetta Hutching.  Recommended to schedule ABI's and Left Venous Duplex and office appt.  Today.  Pt. notified of appt today at 1:00 PM.  Agreed with plan.
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
## 2014-08-03 ENCOUNTER — Encounter: Payer: Self-pay | Admitting: Internal Medicine

## 2014-08-05 ENCOUNTER — Encounter: Payer: Self-pay | Admitting: Internal Medicine

## 2014-08-05 NOTE — Progress Notes (Signed)
Patient ID: [name], male   DOB: 1960-05-18, 54 y.o.   MRN: [id number]  I am completing paperwork today from [place] that requires my signature:  Received 08/01/14: Completion of requests for independent assessment for personal care services [PCS] attestation for medical need  As this patient was recently hospitalized for a watershed infarct in the setting of cerebral hypoperfusion and the documentation outlined by Dr. Redmond Pulling at her last office visit on 07/31/14, it is my medical opinion that this patient requires increased services to assist with his ADLs. He has a complicated medical course which includes brain abscesses along with long-standing peripheral vascular disease which has required multiple interventions and is followed by vascular surgery. He also continues to smoke and has a history of polysubstance abuse which may further complicate his overall condition.  I will request the help of clinic social worker [name] to assist with completing this form so that he is able to receive services in a timely fashion.

## 2014-08-06 ENCOUNTER — Other Ambulatory Visit: Payer: Self-pay

## 2014-08-06 NOTE — Progress Notes (Signed)
Thanks.  Form completed and faxed this morning.

## 2014-08-06 NOTE — Progress Notes (Signed)
Patient ID: [name], male   DOB: 1960/03/29, 54 y.o.   MRN: [id number] [place] faxed completed and signed Change of Medical Status PCS request to [facility] on 08/06/14.

## 2014-08-06 NOTE — Progress Notes (Signed)
Internal Medicine Clinic Attending  Case discussed with Dr. Wilson soon after the resident saw the patient.  We reviewed the resident's history and exam and pertinent patient test results.  I agree with the assessment, diagnosis, and plan of care documented in the resident's note.  

## 2014-08-07 ENCOUNTER — Encounter (HOSPITAL_COMMUNITY): Payer: Self-pay

## 2014-08-07 ENCOUNTER — Other Ambulatory Visit: Payer: Self-pay | Admitting: *Deleted

## 2014-08-07 ENCOUNTER — Other Ambulatory Visit (HOSPITAL_COMMUNITY): Payer: Self-pay

## 2014-08-07 MED ORDER — HYDROXYZINE HCL 25 MG PO TABS: 25.0000 mg | ORAL_TABLET | Freq: Two times a day (BID) | ORAL | Status: DC | PRN

## 2014-08-07 NOTE — Telephone Encounter (Signed)
I have refilled for 30-day supply and will evaluate the patient further at his appointment next month. It was noted in February that he suffers from chronic urticaria and thus requires this medication. I initially refused his request but will fill for now since he has been receiving it for several years.

## 2014-08-14 ENCOUNTER — Ambulatory Visit: Payer: Self-pay | Admitting: Vascular Surgery

## 2014-08-15 ENCOUNTER — Other Ambulatory Visit: Payer: Self-pay

## 2014-08-15 ENCOUNTER — Ambulatory Visit (HOSPITAL_COMMUNITY)
Admission: RE | Admit: 2014-08-15 | Discharge: 2014-08-15 | Disposition: A | Discharge status: Home / Self Care | Payer: Medicaid Other | Source: Ambulatory Visit | Attending: Vascular Surgery | Admitting: Vascular Surgery

## 2014-08-15 ENCOUNTER — Encounter (HOSPITAL_COMMUNITY)
Admission: RE | Disposition: A | Discharge status: Home / Self Care | Payer: Self-pay | Source: Ambulatory Visit | Attending: Vascular Surgery

## 2014-08-15 DIAGNOSIS — I70212 Atherosclerosis of native arteries of extremities with intermittent claudication, left leg: Secondary | ICD-10-CM | POA: Diagnosis not present

## 2014-08-15 DIAGNOSIS — I7092 Chronic total occlusion of artery of the extremities: Secondary | ICD-10-CM | POA: Diagnosis not present

## 2014-08-15 DIAGNOSIS — I70219 Atherosclerosis of native arteries of extremities with intermittent claudication, unspecified extremity: Secondary | ICD-10-CM

## 2014-08-15 DIAGNOSIS — I739 Peripheral vascular disease, unspecified: Secondary | ICD-10-CM | POA: Diagnosis present

## 2014-08-15 DIAGNOSIS — Z95820 Peripheral vascular angioplasty status with implants and grafts: Secondary | ICD-10-CM

## 2014-08-15 HISTORY — PX: ULTRASOUND GUIDANCE FOR VASCULAR ACCESS: SHX6516

## 2014-08-15 HISTORY — PX: PERIPHERAL VASCULAR CATHETERIZATION: SHX172C

## 2014-08-15 LAB — POCT I-STAT, CHEM 8
BUN: 18 mg/dL (ref 6–20)
Calcium, Ion: 1.26 mmol/L — ABNORMAL HIGH (ref 1.12–1.23)
Chloride: 102 mmol/L (ref 101–111)
Creatinine, Ser: 0.8 mg/dL (ref 0.61–1.24)
Glucose, Bld: 117 mg/dL — ABNORMAL HIGH (ref 65–99)
HEMATOCRIT: 45 % (ref 39.0–52.0)
Hemoglobin: 15.3 g/dL (ref 13.0–17.0)
Potassium: 4.4 mmol/L (ref 3.5–5.1)
Sodium: 140 mmol/L (ref 135–145)
TCO2: 27 mmol/L (ref 0–100)

## 2014-08-15 LAB — PROTIME-INR
INR: 1.03 (ref 0.00–1.49)
PROTHROMBIN TIME: 13.7 s (ref 11.6–15.2)

## 2014-08-15 LAB — POCT ACTIVATED CLOTTING TIME: ACTIVATED CLOTTING TIME: 177 s

## 2014-08-15 SURGERY — ABDOMINAL AORTOGRAM

## 2014-08-15 MED ORDER — OXYCODONE-ACETAMINOPHEN 5-325 MG PO TABS
ORAL_TABLET | ORAL | Status: AC
  Administered 2014-08-15: 2 via ORAL
  Filled 2014-08-15: qty 2

## 2014-08-15 MED ORDER — IODIXANOL 320 MG/ML IV SOLN
INTRAVENOUS | Status: DC | PRN
Start: 2014-08-15 — End: 2014-08-15
  Administered 2014-08-15: 185 mL via INTRAVENOUS

## 2014-08-15 MED ORDER — CLOPIDOGREL BISULFATE 75 MG PO TABS: 75.0000 mg | ORAL_TABLET | Freq: Every day | ORAL | Status: DC

## 2014-08-15 MED ORDER — MIDAZOLAM HCL 2 MG/2ML IJ SOLN
INTRAMUSCULAR | Status: DC | PRN
  Administered 2014-08-15 (×2): 1 mg via INTRAVENOUS

## 2014-08-15 MED ORDER — HEPARIN SODIUM (PORCINE) 1000 UNIT/ML IJ SOLN
INTRAMUSCULAR | Status: AC
  Filled 2014-08-15: qty 1

## 2014-08-15 MED ORDER — HEPARIN (PORCINE) IN NACL 2-0.9 UNIT/ML-% IJ SOLN
INTRAMUSCULAR | Status: AC
  Filled 2014-08-15: qty 1000

## 2014-08-15 MED ORDER — HEPARIN SODIUM (PORCINE) 1000 UNIT/ML IJ SOLN
INTRAMUSCULAR | Status: DC | PRN
  Administered 2014-08-15: 4000 [IU] via INTRAVENOUS

## 2014-08-15 MED ORDER — FENTANYL CITRATE (PF) 100 MCG/2ML IJ SOLN
INTRAMUSCULAR | Status: DC | PRN
  Administered 2014-08-15 (×2): 25 ug via INTRAVENOUS

## 2014-08-15 MED ORDER — LIDOCAINE HCL (PF) 1 % IJ SOLN
INTRAMUSCULAR | Status: DC | PRN
  Administered 2014-08-15: 12 mL

## 2014-08-15 MED ORDER — SODIUM CHLORIDE 0.9 % IV SOLN: 1.0000 mL/kg/h | INTRAVENOUS | Status: DC

## 2014-08-15 MED ORDER — OXYCODONE-ACETAMINOPHEN 5-325 MG PO TABS: 1.0000 | ORAL_TABLET | ORAL | Status: DC | PRN

## 2014-08-15 MED ORDER — LIDOCAINE HCL (PF) 1 % IJ SOLN
INTRAMUSCULAR | Status: AC
  Filled 2014-08-15: qty 30

## 2014-08-15 MED ORDER — MIDAZOLAM HCL 2 MG/2ML IJ SOLN
INTRAMUSCULAR | Status: AC
  Filled 2014-08-15: qty 4

## 2014-08-15 MED ORDER — FENTANYL CITRATE (PF) 100 MCG/2ML IJ SOLN
INTRAMUSCULAR | Status: AC
  Filled 2014-08-15: qty 4

## 2014-08-15 MED ORDER — SODIUM CHLORIDE 0.9 % IV SOLN
INTRAVENOUS | Status: DC
  Administered 2014-08-15: 12:00:00 via INTRAVENOUS

## 2014-08-15 MED ORDER — OXYCODONE-ACETAMINOPHEN 5-325 MG PO TABS
1.0000 | ORAL_TABLET | ORAL | Status: DC | PRN
  Administered 2014-08-15: 2 via ORAL

## 2014-08-15 MED ORDER — MORPHINE SULFATE 2 MG/ML IJ SOLN: 1.0000 mg | INTRAMUSCULAR | Status: DC | PRN

## 2014-08-15 SURGICAL SUPPLY — 14 items
CATH ANGIO 5F PIGTAIL 65CM (CATHETERS) ×2 IMPLANT
CATH STRAIGHT 5FR 65CM (CATHETERS) ×2 IMPLANT
COVER PRB 48X5XTLSCP FOLD TPE (BAG) IMPLANT
COVER PROBE 5X48 (BAG) ×12
KIT ENCORE 26 ADVANTAGE (KITS) ×2 IMPLANT
KIT PV (KITS) ×6 IMPLANT
SHEATH BRITE TIP 7FR 35CM (SHEATH) ×2 IMPLANT
SHEATH PINNACLE 5F 10CM (SHEATH) ×4 IMPLANT
STENT GENESIS OPTA 8X24X80 (Permanent Stent) ×2 IMPLANT
STENT OMNILINK ELITE 8X59X80 (Permanent Stent) ×2 IMPLANT
SYR MEDRAD MARK V 150ML (SYRINGE) ×6 IMPLANT
TRANSDUCER W/STOPCOCK (MISCELLANEOUS) ×6 IMPLANT
TRAY PV CATH (CUSTOM PROCEDURE TRAY) ×6 IMPLANT
WIRE HITORQ VERSACORE ST 145CM (WIRE) ×2 IMPLANT

## 2014-08-15 NOTE — Progress Notes (Signed)
Bilateral sheath removed performed on right groin and left groin post intervention by Dr. Donnetta Hutching of the left common iliac artery. Manual compression applied to right groin for 45 minutes and left groin manual compression for 20 minutes. Bilateral groin are stable with sterile 4x4 gauze applied to puncture site and sterile tegaderm to secure. Dressing dry and intact. Bilateral doppler pulses obtained and strong adible pulses present. Pulses are marked for future assessment. Patient understands recovery process. Resting quietly without complains.

## 2014-08-15 NOTE — Discharge Instructions (Signed)

## 2014-08-16 ENCOUNTER — Encounter (HOSPITAL_COMMUNITY): Payer: Self-pay | Admitting: Vascular Surgery

## 2014-08-16 ENCOUNTER — Telehealth: Payer: Self-pay | Admitting: Internal Medicine

## 2014-08-16 NOTE — Telephone Encounter (Signed)
Pt requesting the nurse to call back about blood thinner medicine.

## 2014-08-16 NOTE — Telephone Encounter (Signed)
Spoke w/ pharm again, they will handle this problem

## 2014-08-16 NOTE — Telephone Encounter (Signed)
Called pharm, they will call dr early's office to make him aware that pt is on xarelto and now he has added clodprigel, they will call me back

## 2014-08-17 ENCOUNTER — Telehealth: Payer: Self-pay | Admitting: *Deleted

## 2014-08-17 NOTE — Telephone Encounter (Signed)
Spoke w/ dr patel pt needs to stop the xarelto Pt needs to continue plavix prescribed by dr early [facility], informed them to stop the xarelto, continue the plavix Called pt repeated 3 times to stop the xarelto and continue the plavix, he repeated understanding

## 2014-08-17 NOTE — Telephone Encounter (Signed)
Pt called with concern about his medication.  He is currently taking Xarelto 20 daily with supper. Started on 02/01/14. He has just been started on Plavix 75 mg daily. Started on 08/15/14.  Should he be taking both meds and at what time of day for each. Pt # [id number]  I talked with Dr Maudie Mercury and she is working of the above situation. She will get in touch with Cards today along with pt's PCP.

## 2014-08-17 NOTE — Telephone Encounter (Signed)
i spoke

## 2014-08-20 ENCOUNTER — Telehealth: Payer: Self-pay | Admitting: Vascular Surgery

## 2014-08-20 NOTE — Telephone Encounter (Signed)
-----   Message from Denman George, RN sent at 08/15/2014  4:21 PM EDT ----- Regarding: [name]; also [name] needs 1 mo. f/u w/ TFE, with ABI's and AortoIliac Duplex FYI: [name] [birth year]b [birth year] -no show      [name] / dob [birth year]   ----- Message -----    From: [name] Posner, MD    Sent: 08/15/2014   2:44 PM      To: [facility] Charge Pool  [name] not had aortogram bilateral motion runoff. SonoSite visualization for access to his femoral arteries. 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 Needs to see me in one month with repeat noninvasive vascular lab. Was given Percocet 05/15/2023 #30 no refills. Does not get any refills from [place].  [name] was in patient that I picked up for Dr. Geryl Councilman for access. He was a no-show  Judithann Sheen had stent graft assistant Dr. Kellie Simmering and [name]

## 2014-08-20 NOTE — Telephone Encounter (Signed)
[name] has already informed patient of follow up. Dpm

## 2014-08-24 ENCOUNTER — Encounter: Payer: Self-pay | Admitting: Internal Medicine

## 2014-08-24 ENCOUNTER — Ambulatory Visit (INDEPENDENT_AMBULATORY_CARE_PROVIDER_SITE_OTHER): Payer: Medicaid Other | Admitting: Internal Medicine

## 2014-08-24 VITALS — BP 121/66 | HR 94 | Temp 97.7°F | Ht 71.5 in | Wt 158.4 lb

## 2014-08-24 DIAGNOSIS — H6123 Impacted cerumen, bilateral: Secondary | ICD-10-CM

## 2014-08-24 DIAGNOSIS — I739 Peripheral vascular disease, unspecified: Secondary | ICD-10-CM | POA: Diagnosis present

## 2014-08-24 DIAGNOSIS — G8929 Other chronic pain: Secondary | ICD-10-CM | POA: Diagnosis not present

## 2014-08-24 DIAGNOSIS — E118 Type 2 diabetes mellitus with unspecified complications: Secondary | ICD-10-CM

## 2014-08-24 DIAGNOSIS — M79672 Pain in left foot: Secondary | ICD-10-CM

## 2014-08-24 DIAGNOSIS — H9193 Unspecified hearing loss, bilateral: Secondary | ICD-10-CM

## 2014-08-24 DIAGNOSIS — H918X3 Other specified hearing loss, bilateral: Secondary | ICD-10-CM

## 2014-08-24 LAB — GLUCOSE, CAPILLARY: Glucose-Capillary: 86 mg/dL (ref 65–99)

## 2014-08-24 NOTE — Assessment & Plan Note (Signed)
Overview He is requesting pain medication for me today as it limits his ability to perform ADLs. Since November 2015, he reports to me that he has not used heroin or cocaine. He also reports to me that his urine drug screens have been appropriate. He no longer wishes to return to the pain clinic where he was being seen because he feels they were obsessed with getting him back injections. When asked if there was anything that we would find today and a urine drug screen, he reported that he is currently buying Percocets and Vicodin off the street due to the pain that he is suffering.  Per [facility], he last filled Percocet 5/325 mg 30 tablets on 8/3 by Dr. Donnetta Hutching and 18 tablets on 7/21 by Dr. Oneida Alar.   Assessment Uncontrolled pain that is limiting his ADLs and leading to a diminished quality of life though complicated by history of polysubstance abuse as documented in his prior office visits. Though he is procuring controlled substances by inappropriate means, I can understand his rationale given his current physical condition.  Plan -Check urine drug screen today -Advised the patient that we would need more time to review all the documentation in the chart and that a clean drug screen today would not guarantee a pain contract -Also explained that a pain contract through her clinic would require him to no longer purchase substances off the streets to which he acknowledged understanding

## 2014-08-24 NOTE — Assessment & Plan Note (Addendum)
Overview Since his last appointment, he was seen by Dr. Su Monks surgery] on 7/21 for pain in his left leg. He was supposed to undergo an arteriogram though I'm unable to locate the findings. He reports to me that he had 2 stents placed in the groin area by Dr. Donnetta Hutching just last week.  He is requesting an IT trainer wheelchair today. He tells me that he is unable to perform some of his ADLs and requires the assistance of his health aide 7 days a week to dress himself, toilet himself, bathe himself those able to feed himself. He finds it difficult to ambulate due to the pain that he is experiencing in both legs.  Assessment Suspected limitations in ADLs as a consequence of extensive peripheral vascular disease  Plan -Request the help of our staff members to get in touch with his home health aide to corroborate his account, and if so, schedule an appointment with [facility]

## 2014-08-24 NOTE — Assessment & Plan Note (Signed)
He reports progressive hearing loss bilaterally and feels this is consistent with excess earwax. Exam findings are consistent, and RN was requested to clean his ears. Hearing improved markedly following the procedure.

## 2014-08-24 NOTE — Progress Notes (Signed)
   Subjective:    Patient ID: [name], male    DOB: 1960/03/19, 54 y.o.   MRN: [id number]  HPI [name] is a 54 year old male with type 2 diabetes, polysubstance abuse, including history of IV drug use, history of PE, peripheral vascular disease status post multiple bypass procedures in the right leg who presents today for an evaluation for an electric wheelchair.   Review of Systems  Constitutional: Positive for fatigue. Negative for appetite change.  HENT: Positive for hearing loss.   Musculoskeletal: Positive for gait problem.  Neurological: Negative for weakness.       Objective:   Physical Exam Constitutional: Thin Caucasian male with tobacco odor. No distress.  HENT:  Head: Normocephalic and atraumatic.  Eyes: Conjunctivae are normal. Pupils are 4 mm, direct, consensual, near.  Cardiovascular: Normal rate, regular rhythm and normal heart sounds.  Exam reveals no gallop and no friction rub. No murmur heard. Pulmonary/Chest: Effort normal. No respiratory distress. He has no wheezes. He has no rales.  Abdominal: Soft. Bowel sounds are normal. He exhibits no distension. There is no tenderness.  Neurological: He is alert and oriented to person, place, and time. Bilateral upper and lower extremity strength 5/5. Coordination normal.  Skin: Skin is warm and dry. He is not diaphoretic.  Psychiatric: His behavior is normal.         Assessment & Plan:

## 2014-08-24 NOTE — Patient Instructions (Signed)
We will schedule you a follow-up appointment for further paperwork regarding the wheelchair.   As your pain medication, we will see what your results are for your drug test. Thank you for being honest with [place].

## 2014-08-27 ENCOUNTER — Other Ambulatory Visit: Payer: Self-pay | Admitting: Vascular Surgery

## 2014-08-27 ENCOUNTER — Other Ambulatory Visit: Payer: Self-pay | Admitting: Internal Medicine

## 2014-08-27 NOTE — Progress Notes (Signed)
Internal Medicine Clinic Attending  Case discussed with Dr. Patel,Rushil soon after the resident saw the patient.  We reviewed the resident's history and exam and pertinent patient test results.  I agree with the assessment, diagnosis, and plan of care documented in the resident's note. 

## 2014-08-28 NOTE — Telephone Encounter (Signed)
As this patient was seen by me on 08/24/14 and deemed necessary for their treatment as documented in the EHR, I will refill this prescription for Atarax. He reports chronic itching in his lower legs, and I suspect this may be from vascular insufficiency.

## 2014-08-30 ENCOUNTER — Ambulatory Visit: Payer: Self-pay | Admitting: Cardiovascular Disease

## 2014-08-31 ENCOUNTER — Telehealth: Payer: Self-pay | Admitting: Internal Medicine

## 2014-08-31 NOTE — Telephone Encounter (Signed)
Please call Nurse Zigmund Daniel back ASAP regarding patient's recent lab's drawn on 08/24/2014.

## 2014-08-31 NOTE — Telephone Encounter (Signed)
Called kay back, she needed UDS results from the 8/12 UDS collect, called [facility], [facility]. States she will call [facility] because it seems it did not cross over to our system, she will call triage back and i will call kay

## 2014-09-03 ENCOUNTER — Other Ambulatory Visit: Payer: Self-pay | Admitting: Internal Medicine

## 2014-09-03 LAB — PRESCRIPTION ABUSE MONITORING 17P, URINE
6-ACETYLMORPHINE, URINE: NEGATIVE ng/mL
Amphetamine Scrn, Ur: NEGATIVE ng/mL
BARBITURATE SCREEN URINE: NEGATIVE ng/mL
BENZODIAZEPINE SCREEN, URINE: NEGATIVE ng/mL
Buprenorphine, Urine: NEGATIVE ng/mL
CANNABINOIDS UR QL SCN: NEGATIVE ng/mL
Carisoprodol/Meprobamate, Ur: NEGATIVE ng/mL
Cocaine (Metab) Scrn, Ur: NEGATIVE ng/mL
Creatinine(Crt), U: 150.7 mg/dL (ref 20.0–300.0)
EDDP, URINE: NEGATIVE ng/mL
FENTANYL, URINE: NEGATIVE pg/mL
MDMA Screen, Urine: NEGATIVE ng/mL
MEPERIDINE SCREEN, URINE: NEGATIVE ng/mL
Methadone Screen, Urine: NEGATIVE ng/mL
NITRITE URINE, QUANTITATIVE: NEGATIVE ug/mL
PH UR, DRUG SCRN: 5.2 (ref 4.5–8.9)
Phencyclidine Qn, Ur: NEGATIVE ng/mL
Propoxyphene Scrn, Ur: NEGATIVE ng/mL
SPECIFIC GRAVITY: 1.021
TRAMADOL SCREEN, URINE: NEGATIVE ng/mL
Tapentadol, Urine: NEGATIVE ng/mL

## 2014-09-03 LAB — OXYCODONE/OXYMORPHONE CONFIRM
OXYCODONE/OXYMORPH: POSITIVE — AB
OXYCODONE: 1070 ng/mL
OXYCODONE: POSITIVE — AB
OXYMORPHONE CONFIRM: 1940 ng/mL
OXYMORPHONE: POSITIVE — AB

## 2014-09-03 LAB — OPIATES CONFIRMATION, URINE
Codeine: NEGATIVE
HYDROCODONE CONFIRM: 1660 ng/mL
HYDROMORPHONE: POSITIVE — AB
Hydrocodone: POSITIVE — AB
Hydromorphone Confirm: 147 ng/mL
Morphine: NEGATIVE
Opiates: POSITIVE ng/mL — AB

## 2014-09-03 NOTE — Telephone Encounter (Signed)
As this patient was seen by me on 08/24/14 and deemed necessary for their treatment as documented in the EHR, I will refill this prescription for hydrocortisone cream. He has extensive disease in bilateral lower extremities and can benefit from symptomatic relief.

## 2014-09-06 ENCOUNTER — Telehealth: Payer: Self-pay | Admitting: Internal Medicine

## 2014-09-06 NOTE — Telephone Encounter (Signed)
Rec'd message from [facility] requesting patient's  OV notes from 09/2012  from [facility]. The patient was only seen in October of 2014 and those notes are already scanned into Epic under Media. Therefore we have no new notes to request for this patient.   Also this patient was released from [facility] per Rep @ [facility] on 01/18/2013 for not keeping appointmentd and non-compliance.

## 2014-09-06 NOTE — Telephone Encounter (Signed)
Placed call to [facility] @ [facility].  Left message for her to return my call

## 2014-09-07 ENCOUNTER — Ambulatory Visit: Payer: Medicaid Other | Admitting: Internal Medicine

## 2014-09-07 NOTE — Telephone Encounter (Signed)
Per [place], it appears he received Embeda ER 20/0.8 mg 30 tablets on 06/22/14 from Dr. Balinda Quails with [facility]. I spoke with him over the phone and have summarized the updates he had below. Can we formerly receive his office documentation to include into our chart for future reference?  7/5: He was informed he had violated his narcotics contract as his UDS was positive for things that were not prescribed to him [oxycodone and oxymorphone]. He was offered a follow-up appointment to review other interventions the refused and was therefore dismissed from their practice.  6/20: Reported no improvement of 10/10 pain with bilateral facet lumbar nerve injection   6/8: UDS notable for oxycodone, oxymorphone, cocaine metabolite

## 2014-09-10 NOTE — Telephone Encounter (Signed)
Another return call.  No answer, message left.

## 2014-09-13 ENCOUNTER — Encounter: Payer: Self-pay | Admitting: Internal Medicine

## 2014-09-13 ENCOUNTER — Ambulatory Visit (INDEPENDENT_AMBULATORY_CARE_PROVIDER_SITE_OTHER): Payer: Medicaid Other | Admitting: Internal Medicine

## 2014-09-13 ENCOUNTER — Telehealth: Payer: Self-pay | Admitting: *Deleted

## 2014-09-13 VITALS — BP 152/81 | HR 80 | Temp 98.6°F | Wt 164.0 lb

## 2014-09-13 DIAGNOSIS — G060 Intracranial abscess and granuloma: Secondary | ICD-10-CM

## 2014-09-13 DIAGNOSIS — I739 Peripheral vascular disease, unspecified: Secondary | ICD-10-CM | POA: Diagnosis not present

## 2014-09-13 DIAGNOSIS — G609 Hereditary and idiopathic neuropathy, unspecified: Secondary | ICD-10-CM | POA: Diagnosis not present

## 2014-09-13 DIAGNOSIS — Z23 Encounter for immunization: Secondary | ICD-10-CM | POA: Diagnosis not present

## 2014-09-13 MED ORDER — PREGABALIN 75 MG PO CAPS: 75.0000 mg | ORAL_CAPSULE | Freq: Three times a day (TID) | ORAL | Status: DC
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
## 2014-09-13 NOTE — Telephone Encounter (Signed)
PA for Lyrica rx received - paperwork completed and faxed to [facility] for processing.

## 2014-09-14 ENCOUNTER — Encounter: Payer: Self-pay | Admitting: Vascular Surgery

## 2014-09-18 ENCOUNTER — Encounter (HOSPITAL_COMMUNITY): Payer: Self-pay

## 2014-09-18 ENCOUNTER — Encounter: Payer: Self-pay | Admitting: Vascular Surgery

## 2014-09-20 ENCOUNTER — Ambulatory Visit: Payer: Self-pay | Admitting: Internal Medicine

## 2014-09-21 ENCOUNTER — Telehealth: Payer: Self-pay | Admitting: *Deleted

## 2014-09-21 NOTE — Telephone Encounter (Signed)
Pt called upset about hospital discharge in August 2016  - has many questions about a lesion in chest and brain. Appt made 09/25/14 9:15AM Dr Sherrye Payor. Dr Shawnie Pons has no openings till October 2016. Hilda Blades Jolan Mealor RN 09/21/14 10AM

## 2014-09-25 ENCOUNTER — Ambulatory Visit: Payer: Self-pay | Admitting: Internal Medicine

## 2014-09-25 ENCOUNTER — Encounter: Payer: Self-pay | Admitting: Internal Medicine

## 2014-10-05 ENCOUNTER — Encounter (HOSPITAL_COMMUNITY): Payer: Medicaid Other

## 2014-10-09 ENCOUNTER — Encounter: Payer: Medicaid Other | Admitting: Vascular Surgery

## 2014-10-19 ENCOUNTER — Encounter: Payer: Medicaid Other | Admitting: Internal Medicine

## 2014-10-31 ENCOUNTER — Inpatient Hospital Stay (HOSPITAL_COMMUNITY): Admission: RE | Admit: 2014-10-31 | Payer: Self-pay | Source: Ambulatory Visit

## 2014-10-31 ENCOUNTER — Inpatient Hospital Stay (INDEPENDENT_AMBULATORY_CARE_PROVIDER_SITE_OTHER)
Admission: RE | Admit: 2014-10-31 | Discharge: 2014-10-31 | Disposition: A | Discharge status: Home / Self Care | Payer: Medicaid Other | Source: Ambulatory Visit

## 2014-10-31 ENCOUNTER — Telehealth: Payer: Self-pay | Admitting: Vascular Surgery

## 2014-10-31 DIAGNOSIS — I739 Peripheral vascular disease, unspecified: Secondary | ICD-10-CM

## 2014-10-31 DIAGNOSIS — I70219 Atherosclerosis of native arteries of extremities with intermittent claudication, unspecified extremity: Secondary | ICD-10-CM | POA: Diagnosis not present

## 2014-10-31 DIAGNOSIS — Z95820 Peripheral vascular angioplasty status with implants and grafts: Secondary | ICD-10-CM

## 2014-10-31 DIAGNOSIS — Z959 Presence of cardiac and vascular implant and graft, unspecified: Secondary | ICD-10-CM

## 2014-10-31 NOTE — Telephone Encounter (Signed)
Spoke with pt. Gave him new appointment info for 10/26 and 11/1 due to missed appointment 10/31/14.  Was told that the only other appointments available were at the end of November. Pt verbalized understanding.

## 2014-11-01 ENCOUNTER — Encounter (HOSPITAL_COMMUNITY): Payer: Self-pay

## 2014-11-06 ENCOUNTER — Encounter: Payer: Medicaid Other | Admitting: Vascular Surgery

## 2014-11-07 ENCOUNTER — Ambulatory Visit (INDEPENDENT_AMBULATORY_CARE_PROVIDER_SITE_OTHER)
Admission: RE | Admit: 2014-11-07 | Discharge: 2014-11-07 | Disposition: A | Discharge status: Home / Self Care | Payer: Medicaid Other | Source: Ambulatory Visit | Attending: Vascular Surgery | Admitting: Vascular Surgery

## 2014-11-07 ENCOUNTER — Ambulatory Visit (HOSPITAL_COMMUNITY)
Admission: RE | Admit: 2014-11-07 | Discharge: 2014-11-07 | Disposition: A | Discharge status: Home / Self Care | Payer: Medicaid Other | Source: Ambulatory Visit | Attending: Vascular Surgery | Admitting: Vascular Surgery

## 2014-11-07 DIAGNOSIS — I739 Peripheral vascular disease, unspecified: Secondary | ICD-10-CM | POA: Diagnosis not present

## 2014-11-07 DIAGNOSIS — Z95820 Peripheral vascular angioplasty status with implants and grafts: Secondary | ICD-10-CM | POA: Diagnosis not present

## 2014-11-07 DIAGNOSIS — I70219 Atherosclerosis of native arteries of extremities with intermittent claudication, unspecified extremity: Secondary | ICD-10-CM | POA: Diagnosis not present

## 2014-11-07 DIAGNOSIS — Z95828 Presence of other vascular implants and grafts: Secondary | ICD-10-CM | POA: Diagnosis not present

## 2014-11-07 DIAGNOSIS — Z48812 Encounter for surgical aftercare following surgery on the circulatory system: Secondary | ICD-10-CM | POA: Insufficient documentation

## 2014-11-08 ENCOUNTER — Encounter: Payer: Self-pay | Admitting: Vascular Surgery

## 2014-11-08 ENCOUNTER — Ambulatory Visit: Payer: Self-pay | Admitting: Internal Medicine

## 2014-11-12 ENCOUNTER — Other Ambulatory Visit: Payer: Self-pay | Admitting: Internal Medicine

## 2014-11-12 ENCOUNTER — Telehealth: Payer: Self-pay | Admitting: *Deleted

## 2014-11-12 DIAGNOSIS — K219 Gastro-esophageal reflux disease without esophagitis: Secondary | ICD-10-CM
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
## 2014-11-12 NOTE — Telephone Encounter (Signed)
Got refill request from [facility] for gemfibrozil, they state he has been picking it up regularly for a few months now, cant say he has been taking it but he has been picking it up. He has not answered the phone, told [facility] to ask him to call and make an appt for follow up

## 2014-11-13 ENCOUNTER — Ambulatory Visit (INDEPENDENT_AMBULATORY_CARE_PROVIDER_SITE_OTHER): Payer: Medicaid Other | Admitting: Vascular Surgery

## 2014-11-13 ENCOUNTER — Other Ambulatory Visit: Payer: Self-pay

## 2014-11-13 ENCOUNTER — Encounter: Payer: Self-pay | Admitting: Vascular Surgery

## 2014-11-13 ENCOUNTER — Other Ambulatory Visit (HOSPITAL_COMMUNITY): Payer: Self-pay | Admitting: Interventional Radiology

## 2014-11-13 VITALS — BP 125/79 | HR 85 | Ht 71.5 in | Wt 162.0 lb

## 2014-11-13 DIAGNOSIS — I739 Peripheral vascular disease, unspecified: Secondary | ICD-10-CM

## 2014-11-13 DIAGNOSIS — I729 Aneurysm of unspecified site: Secondary | ICD-10-CM

## 2014-11-13 NOTE — Telephone Encounter (Signed)
The last I remember, he had told the front desk he was transferring his care due to the fact that we did not treat his pain adequately. Per [facility] [place], he last filled Percocet 5/325 x 90 tablets on 10/22/14 from Dr. Ricke Hey. Please call over to their office and inform them of this new information since he it appears he is no longer one of our clinic patients.

## 2014-11-13 NOTE — Telephone Encounter (Signed)
Called [facility], they will notify that office
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
## 2014-11-20 ENCOUNTER — Encounter (HOSPITAL_COMMUNITY): Payer: Self-pay

## 2014-11-20 ENCOUNTER — Encounter: Payer: Self-pay | Admitting: Vascular Surgery

## 2014-11-21 ENCOUNTER — Encounter (HOSPITAL_COMMUNITY): Payer: Medicaid Other

## 2014-11-21 ENCOUNTER — Encounter (HOSPITAL_COMMUNITY)
Admission: RE | Disposition: A | Discharge status: Home / Self Care | Payer: Self-pay | Source: Ambulatory Visit | Attending: Vascular Surgery

## 2014-11-21 ENCOUNTER — Encounter (HOSPITAL_COMMUNITY): Payer: Self-pay | Admitting: Surgery

## 2014-11-21 ENCOUNTER — Other Ambulatory Visit: Payer: Self-pay

## 2014-11-21 ENCOUNTER — Ambulatory Visit (HOSPITAL_COMMUNITY)
Admission: RE | Admit: 2014-11-21 | Discharge: 2014-11-21 | Disposition: A | Discharge status: Home / Self Care | Payer: Medicaid Other | Source: Ambulatory Visit | Attending: Vascular Surgery | Admitting: Vascular Surgery

## 2014-11-21 DIAGNOSIS — I110 Hypertensive heart disease with heart failure: Secondary | ICD-10-CM | POA: Diagnosis not present

## 2014-11-21 DIAGNOSIS — I739 Peripheral vascular disease, unspecified: Secondary | ICD-10-CM | POA: Diagnosis present

## 2014-11-21 DIAGNOSIS — I509 Heart failure, unspecified: Secondary | ICD-10-CM | POA: Insufficient documentation

## 2014-11-21 DIAGNOSIS — I70211 Atherosclerosis of native arteries of extremities with intermittent claudication, right leg: Secondary | ICD-10-CM | POA: Diagnosis not present

## 2014-11-21 DIAGNOSIS — J449 Chronic obstructive pulmonary disease, unspecified: Secondary | ICD-10-CM | POA: Insufficient documentation

## 2014-11-21 DIAGNOSIS — M069 Rheumatoid arthritis, unspecified: Secondary | ICD-10-CM | POA: Diagnosis not present

## 2014-11-21 DIAGNOSIS — E1151 Type 2 diabetes mellitus with diabetic peripheral angiopathy without gangrene: Secondary | ICD-10-CM | POA: Insufficient documentation

## 2014-11-21 DIAGNOSIS — Z8614 Personal history of Methicillin resistant Staphylococcus aureus infection: Secondary | ICD-10-CM | POA: Diagnosis not present

## 2014-11-21 DIAGNOSIS — K219 Gastro-esophageal reflux disease without esophagitis: Secondary | ICD-10-CM | POA: Insufficient documentation

## 2014-11-21 DIAGNOSIS — Z8673 Personal history of transient ischemic attack (TIA), and cerebral infarction without residual deficits: Secondary | ICD-10-CM | POA: Diagnosis not present

## 2014-11-21 DIAGNOSIS — G4730 Sleep apnea, unspecified: Secondary | ICD-10-CM | POA: Insufficient documentation

## 2014-11-21 DIAGNOSIS — F329 Major depressive disorder, single episode, unspecified: Secondary | ICD-10-CM | POA: Insufficient documentation

## 2014-11-21 DIAGNOSIS — E785 Hyperlipidemia, unspecified: Secondary | ICD-10-CM | POA: Diagnosis not present

## 2014-11-21 DIAGNOSIS — I6523 Occlusion and stenosis of bilateral carotid arteries: Secondary | ICD-10-CM | POA: Diagnosis not present

## 2014-11-21 DIAGNOSIS — Z8249 Family history of ischemic heart disease and other diseases of the circulatory system: Secondary | ICD-10-CM | POA: Diagnosis not present

## 2014-11-21 DIAGNOSIS — I70311 Atherosclerosis of unspecified type of bypass graft(s) of the extremities with intermittent claudication, right leg: Secondary | ICD-10-CM | POA: Diagnosis not present

## 2014-11-21 DIAGNOSIS — B1920 Unspecified viral hepatitis C without hepatic coma: Secondary | ICD-10-CM | POA: Insufficient documentation

## 2014-11-21 DIAGNOSIS — Z87442 Personal history of urinary calculi: Secondary | ICD-10-CM | POA: Insufficient documentation

## 2014-11-21 DIAGNOSIS — Z86711 Personal history of pulmonary embolism: Secondary | ICD-10-CM | POA: Insufficient documentation

## 2014-11-21 DIAGNOSIS — G894 Chronic pain syndrome: Secondary | ICD-10-CM | POA: Insufficient documentation

## 2014-11-21 DIAGNOSIS — F17210 Nicotine dependence, cigarettes, uncomplicated: Secondary | ICD-10-CM | POA: Insufficient documentation

## 2014-11-21 DIAGNOSIS — J45909 Unspecified asthma, uncomplicated: Secondary | ICD-10-CM | POA: Diagnosis not present

## 2014-11-21 HISTORY — DX: Personal history of urinary calculi: Z87.442

## 2014-11-21 HISTORY — DX: Unspecified convulsions: R56.9

## 2014-11-21 HISTORY — PX: PERIPHERAL VASCULAR CATHETERIZATION: SHX172C

## 2014-11-21 LAB — POCT I-STAT, CHEM 8
BUN: 23 mg/dL — ABNORMAL HIGH (ref 6–20)
CALCIUM ION: 1.16 mmol/L (ref 1.12–1.23)
Chloride: 106 mmol/L (ref 101–111)
Creatinine, Ser: 0.9 mg/dL (ref 0.61–1.24)
Glucose, Bld: 121 mg/dL — ABNORMAL HIGH (ref 65–99)
HCT: 49 % (ref 39.0–52.0)
Hemoglobin: 16.7 g/dL (ref 13.0–17.0)
Potassium: 4 mmol/L (ref 3.5–5.1)
SODIUM: 142 mmol/L (ref 135–145)
TCO2: 23 mmol/L (ref 0–100)

## 2014-11-21 LAB — COMPREHENSIVE METABOLIC PANEL
ALBUMIN: 3.6 g/dL (ref 3.5–5.0)
ALK PHOS: 74 U/L (ref 38–126)
ALT: 15 U/L — AB (ref 17–63)
AST: 22 U/L (ref 15–41)
Anion gap: 6 (ref 5–15)
BUN: 15 mg/dL (ref 6–20)
CALCIUM: 8.6 mg/dL — AB (ref 8.9–10.3)
CHLORIDE: 110 mmol/L (ref 101–111)
CO2: 22 mmol/L (ref 22–32)
CREATININE: 0.86 mg/dL (ref 0.61–1.24)
GFR calc Af Amer: >60 mL/min (ref >60)
GFR calc non Af Amer: >60 mL/min (ref >60)
GLUCOSE: 167 mg/dL — AB (ref 65–99)
Potassium: 3.7 mmol/L (ref 3.5–5.1)
SODIUM: 138 mmol/L (ref 135–145)
Total Bilirubin: 0.5 mg/dL (ref 0.3–1.2)
Total Protein: 6.5 g/dL (ref 6.5–8.1)

## 2014-11-21 LAB — URINALYSIS, ROUTINE W REFLEX MICROSCOPIC
BILIRUBIN URINE: NEGATIVE
GLUCOSE, UA: 250 mg/dL — AB
Hgb urine dipstick: NEGATIVE
KETONES UR: NEGATIVE mg/dL
LEUKOCYTES UA: NEGATIVE
NITRITE: NEGATIVE
PH: 6 (ref 5.0–8.0)
PROTEIN: NEGATIVE mg/dL
Specific Gravity, Urine: 1.04 — ABNORMAL HIGH (ref 1.005–1.030)
Urobilinogen, UA: 1 mg/dL (ref 0.0–1.0)

## 2014-11-21 LAB — CBC
HCT: 43.7 % (ref 39.0–52.0)
HEMOGLOBIN: 14.8 g/dL (ref 13.0–17.0)
MCH: 31.2 pg (ref 26.0–34.0)
MCHC: 33.9 g/dL (ref 30.0–36.0)
MCV: 92.2 fL (ref 78.0–100.0)
PLATELETS: 182 10*3/uL (ref 150–400)
RBC: 4.74 MIL/uL (ref 4.22–5.81)
RDW: 13.4 % (ref 11.5–15.5)
WBC: 6.8 10*3/uL (ref 4.0–10.5)

## 2014-11-21 LAB — APTT: APTT: 32 s (ref 24–37)

## 2014-11-21 LAB — TYPE AND SCREEN
ABO/RH(D): A POS
Antibody Screen: NEGATIVE

## 2014-11-21 LAB — PROTIME-INR
INR: 1.08 (ref 0.00–1.49)
Prothrombin Time: 14.2 seconds (ref 11.6–15.2)

## 2014-11-21 LAB — GLUCOSE, CAPILLARY: Glucose-Capillary: 84 mg/dL (ref 65–99)

## 2014-11-21 LAB — SURGICAL PCR SCREEN
MRSA, PCR: NEGATIVE
Staphylococcus aureus: NEGATIVE

## 2014-11-21 SURGERY — ABDOMINAL AORTOGRAM
Anesthesia: LOCAL

## 2014-11-21 MED ORDER — SODIUM CHLORIDE 0.9 % IV SOLN: 1.0000 mL/kg/h | INTRAVENOUS | Status: DC

## 2014-11-21 MED ORDER — OXYCODONE-ACETAMINOPHEN 5-325 MG PO TABS
2.0000 | ORAL_TABLET | Freq: Once | ORAL | Status: AC
  Administered 2014-11-21: 2 via ORAL

## 2014-11-21 MED ORDER — HEPARIN (PORCINE) IN NACL 2-0.9 UNIT/ML-% IJ SOLN
INTRAMUSCULAR | Status: AC
  Filled 2014-11-21: qty 1000

## 2014-11-21 MED ORDER — SODIUM CHLORIDE 0.9 % IV SOLN
INTRAVENOUS | Status: DC
  Administered 2014-11-21: 11:00:00 via INTRAVENOUS

## 2014-11-21 MED ORDER — LIDOCAINE HCL (PF) 1 % IJ SOLN
INTRAMUSCULAR | Status: AC
  Filled 2014-11-21: qty 30

## 2014-11-21 MED ORDER — HYDRALAZINE HCL 20 MG/ML IJ SOLN
INTRAMUSCULAR | Status: AC
  Filled 2014-11-21: qty 1

## 2014-11-21 MED ORDER — IODIXANOL 320 MG/ML IV SOLN
INTRAVENOUS | Status: DC | PRN
  Administered 2014-11-21: 140 mL via INTRAVENOUS

## 2014-11-21 MED ORDER — OXYCODONE-ACETAMINOPHEN 5-325 MG PO TABS
ORAL_TABLET | ORAL | Status: AC
  Filled 2014-11-21: qty 2

## 2014-11-21 MED ORDER — LIDOCAINE HCL (PF) 1 % IJ SOLN
INTRAMUSCULAR | Status: DC | PRN
  Administered 2014-11-21: 12:00:00

## 2014-11-21 MED ORDER — HYDRALAZINE HCL 20 MG/ML IJ SOLN: 10.0000 mg | Freq: Once | INTRAMUSCULAR | Status: DC

## 2014-11-21 SURGICAL SUPPLY — 17 items
BAG SNAP BAND KOVER 36X36 (MISCELLANEOUS) ×1 IMPLANT
CATH ANGIO 5F PIGTAIL 65CM (CATHETERS) ×1 IMPLANT
CATH CROSS OVER TEMPO 5F (CATHETERS) ×1 IMPLANT
CATH SOFT-VU 4F 65 STRAIGHT (CATHETERS) IMPLANT
CATH SOFT-VU STRAIGHT 4F 65CM (CATHETERS) ×2
COVER DOME SNAP 22 D (MISCELLANEOUS) ×1 IMPLANT
COVER PRB 48X5XTLSCP FOLD TPE (BAG) IMPLANT
COVER PROBE 5X48 (BAG) ×2
GUIDEWIRE ANGLED .035X150CM (WIRE) ×1 IMPLANT
KIT PV (KITS) ×2 IMPLANT
PROTECTION STATION PRESSURIZED (MISCELLANEOUS) ×2
SHEATH PINNACLE 5F 10CM (SHEATH) ×1 IMPLANT
STATION PROTECTION PRESSURIZED (MISCELLANEOUS) IMPLANT
SYR MEDRAD MARK V 150ML (SYRINGE) ×2 IMPLANT
TRANSDUCER W/STOPCOCK (MISCELLANEOUS) ×2 IMPLANT
TRAY PV CATH (CUSTOM PROCEDURE TRAY) ×2 IMPLANT
WIRE HITORQ VERSACORE ST 145CM (WIRE) ×1 IMPLANT

## 2014-11-21 NOTE — Progress Notes (Signed)
Left groin level 0. Ambulated around circle w/o difficulty. Left groin remains level 0. Patient dressed. D/C instructions reviewed w/patient and copy given to him. Son on the way to pick him up.

## 2014-11-21 NOTE — Progress Notes (Signed)
Patient unable to urinate at this time as he stated he had just urinated. Nurse left urine specimen cup at bedside. Patient stated he would urinate before leaving. Nurse informed Melody,RN that specimen was left at bedside and would need to be collected once patient urinated. Melody, RN verbalized understanding.

## 2014-11-21 NOTE — Progress Notes (Signed)
PAT done in Short Stay-Procedural room 4 after patient had abdominal aortogram.  Patient denied having any acute cardiac or pulmonary issues  PCP is Ricke Hey  Patient informed Nurse that he is a type II diabetic, but does not take any diabetic medications

## 2014-11-21 NOTE — Discharge Instructions (Signed)

## 2014-11-21 NOTE — Op Note (Signed)
    OPERATIVE REPORT  DATE OF SURGERY: 11/21/2014  PATIENT: [name], 54 y.o. male MRN: [id number]  DOB: 1960/03/11  PRE-OPERATIVE DIAGNOSIS: Limiting claudication right leg with severe distal vein graft stenosis from prior femoral to anterior tibial bypass  POST-OPERATIVE DIAGNOSIS:  Same  PROCEDURE: Aortogram with bilateral motion runoff  SURGEON:  Curt Jews, M.D.  ANESTHESIA:  1% lidocaine local  PLAN OF CARE: Holding area stable   PATIENT DISPOSITION:  PACU - hemodynamically stable  PROCEDURE DETAILS: The patient was taken to the peripheral vascular cath lab placed supine position where both groins were prepped and draped in usual sterile fashion. Using SonoSite ultrasound visualization the left common femoral artery was entered with an 18-gauge needle using local anesthesia and a guidewire was passed centrally. The patient had a prior placed left common iliac stent and care was taken to assure the guidewire went through the lumen of the stent. 5 French sheath was passed over the guidewire and a pigtail catheter was positioned the level the suprarenal aorta. AP projections were undertaken this revealed widely patent single renal arteries bilaterally. There was no evidence of aortic stenosis. The left common iliac widely patent with no evidence of restenosis. The native external and the iliac arteries were normal caliber. The patient then had a long-leg runoff bilaterally. This revealed patency of the left superficial femoral artery with the normal popliteal artery on the left and three-vessel runoff. On the right the patient had a prior placed right femoral to anterior tibial bypass. There was widely patent proximal anastomosis and excellent flow into a large performed on the right. There was very slow flow through the right femoral anterior tibial vein graft. The pigtail catheter was removed and a crossover catheter was used to direct a guidewire down to the level of the right  femoral artery via the left groin. A 4 French endhole catheter was positioned in the distal external iliac artery at the level of the right anastomosis. Stepped runs were taken that showed a widely patent vein graft throughout the thigh and knee area. There was a high-grade vein graft stenosis approximately 3 cm above the anastomosis and this continued down to the anastomosis itself. Runoff on the right was via the anterior tibial which reconstituted at its origin. There was occlusion of the tibioperoneal trunk and then reconstitution of the peroneal artery below this with no evidence of flow in the posterior tibial artery. The patient tolerated to [place] complication was transferred to the holding area for sheath was pulled.  Findings #1 widely patent aortoiliac segments with widely patent prior placed left common iliac stent #2 Patent left superficial femoral profunda popliteal and three-vessel runoff #3 right femoral to anterior tibial vein graft patent with a high-grade stenosis over approximately 2-3 cm and the vein graft and at the distal anastomosis  Plan will be discharged today. He will be readmitted next week for revision of his femoral anterior tibial with the replacement of this the distal segment of his vein graft.   Curt Jews, M.D. 11/21/2014 12:59 PM

## 2014-11-21 NOTE — Progress Notes (Signed)
Dr Early notified of B/P and order noted and when went to give B/P med B/P was decreased

## 2014-11-21 NOTE — Progress Notes (Signed)
Pt does not have anyone with him today. States he will not have anyone home with him this evening. States he had his aid drop him off today. He states he has never had anyone with him at night. He states he may be able to have his son stay. Son was called with no answer or message left. Dr Donnetta Hutching was called, he said to proceed.
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
## 2014-11-21 NOTE — Interval H&P Note (Signed)
History and Physical Interval Note:  11/21/2014 12:07 PM  [name]  has presented today for surgery, with the diagnosis of pvd with Right Leg rest pain  The various methods of treatment have been discussed with the patient and family. After consideration of risks, benefits and other options for treatment, the patient has consented to  Procedure(s): Abdominal Aortogram (N/A) as a surgical intervention .  The patient's history has been reviewed, patient examined, no change in status, stable for surgery.  I have reviewed the patient's chart and labs.  Questions were answered to the patient's satisfaction.     Curt Jews

## 2014-11-21 NOTE — Pre-Procedure Instructions (Signed)
HECTOR TAFT  11/21/2014     Your procedure is scheduled on : Wednesday November 28, 2014 at 12:15 PM.  Report to [facility] Admitting at 10:00 AM.  Call this number if you have problems the morning of surgery: [phone]    Remember:  Do not eat food or drink liquids after midnight.  Take these medicines the morning of surgery with A SIP OF WATER : Albuterol nebulizer if needed, Gabapentin (Horizant), Levetiracetam (Keppra), Oxycodone (Percocet) if needed, Pantoprazole (Protonix), Pregabalin (Lyrica), Ranitidine (Zantac)   Stop Plavix 5 days prior to procedure (Last dose on Thursday 11/22/14)   Stop taking any vitamins, herbal medications, Ibuprofen, Advil, Motrin, Aleve, Goody's, BC Powder, etc   Do not wear jewelry.  Do not wear lotions, powders, or cologne.    Men may shave face and neck.  Do not bring valuables to the hospital.  [facility] is not responsible for any belongings or valuables.  Contacts, dentures or bridgework may not be worn into surgery.  Leave your suitcase in the car.  After surgery it may be brought to your room.  For patients admitted to the hospital, discharge time will be determined by your treatment team.  Patients discharged the day of surgery will not be allowed to drive home.   Name and phone number of your driver:    Special instructions:  Shower using CHG soap the night before and the morning of your surgery  Please read over the following fact sheets that you were given. Pain Booklet, Coughing and Deep Breathing, Blood Transfusion Information, MRSA Information and Surgical Site Infection Prevention

## 2014-11-22 ENCOUNTER — Encounter (HOSPITAL_COMMUNITY): Payer: Self-pay | Admitting: Vascular Surgery

## 2014-11-22 LAB — HEMOGLOBIN A1C
HEMOGLOBIN A1C: 5.5 % (ref 4.8–5.6)
MEAN PLASMA GLUCOSE: 111 mg/dL

## 2014-11-23 ENCOUNTER — Encounter (HOSPITAL_COMMUNITY): Payer: Self-pay | Admitting: Vascular Surgery
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
## 2014-11-27 ENCOUNTER — Ambulatory Visit (HOSPITAL_COMMUNITY): Admission: RE | Admit: 2014-11-27 | Payer: Medicaid Other | Source: Ambulatory Visit

## 2014-11-27 IMAGING — XA IR ANGIO INTRA EXTRACRAN SEL COM CAROTID INNOMINATE BILAT MOD SE
1 of 2 series · 11 of 24 positions shown · IV contrast (IODINE)
Comparison: Catheter angiogram of 02/16/2012.

CLINICAL DATA: Patient with continuous symptoms of dizziness and
vertigo on looking up.

BILATERAL COMMON CAROTID ARTERY AND BILATERAL VERTEBRAL ARTERY
ANGIOGRAMS

[Series 300: neuro · 11 of 180 slices shown]
[im 1/180]
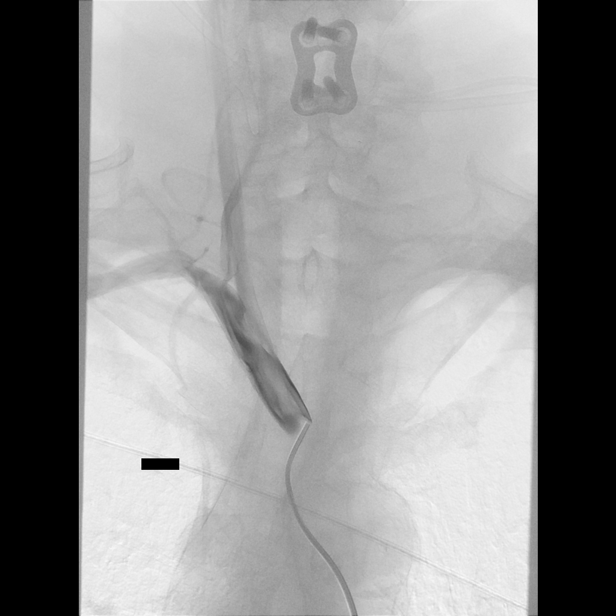
[im 17/180]
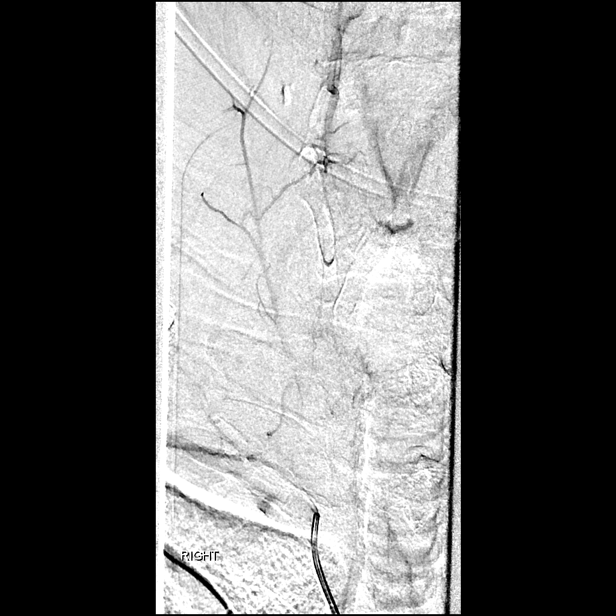
[im 33/180]
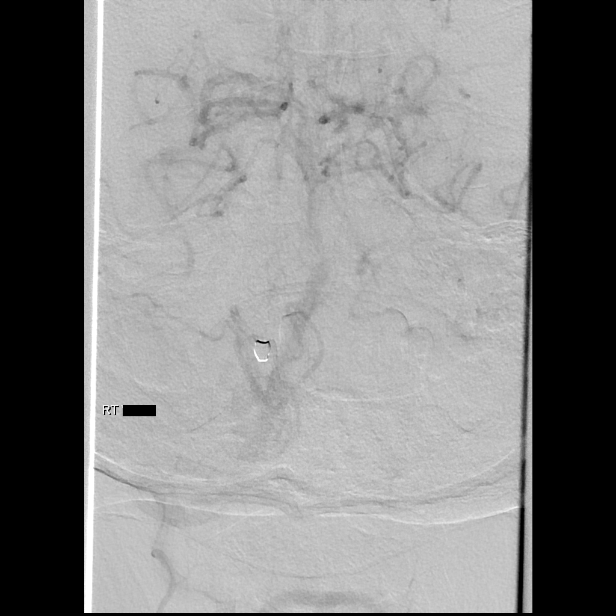
[im 49/180]
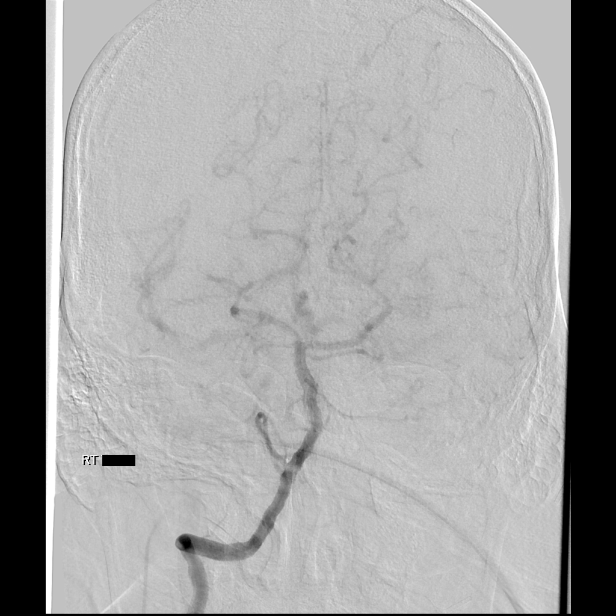
[im 66/180]
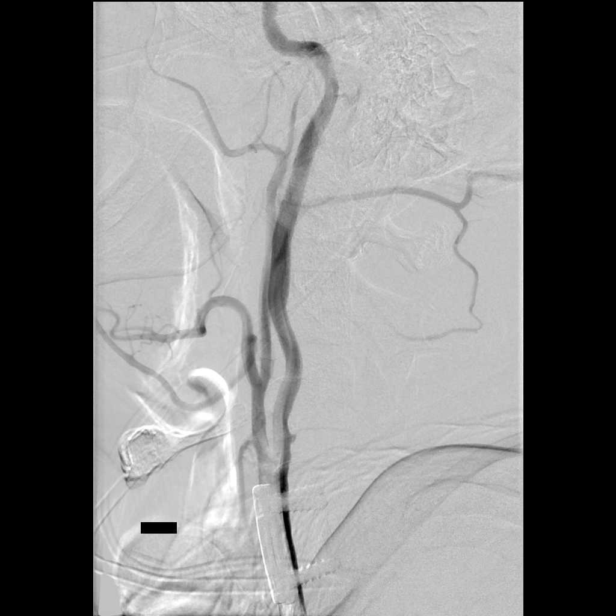
[im 90/180]
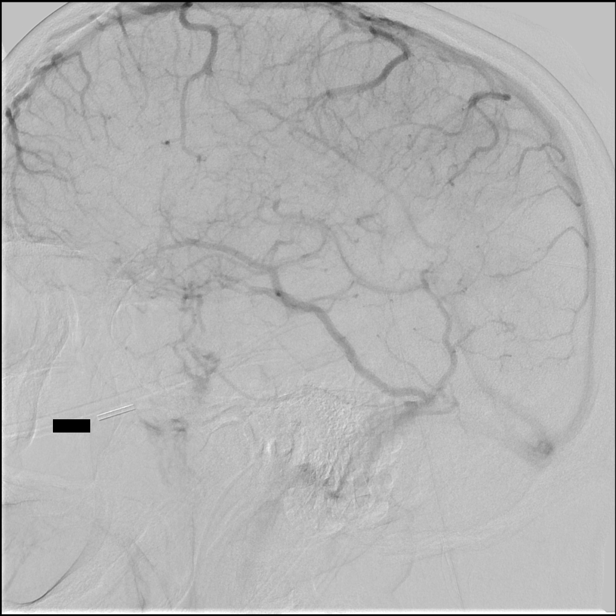
[im 106/180]
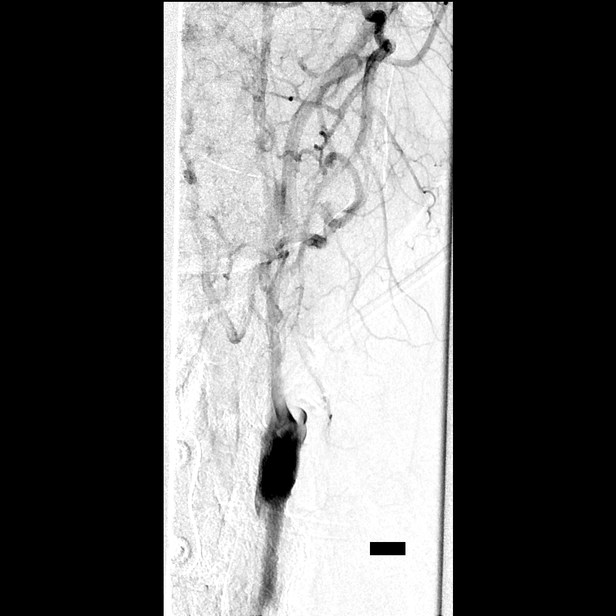
[im 123/180]
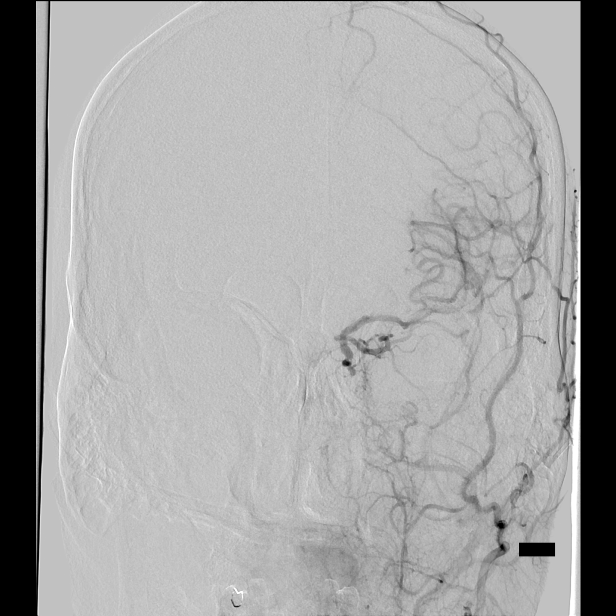
[im 139/180]
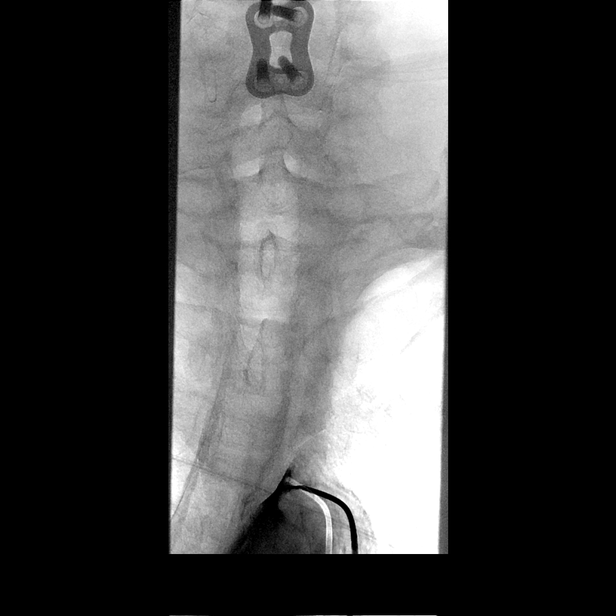
[im 155/180]
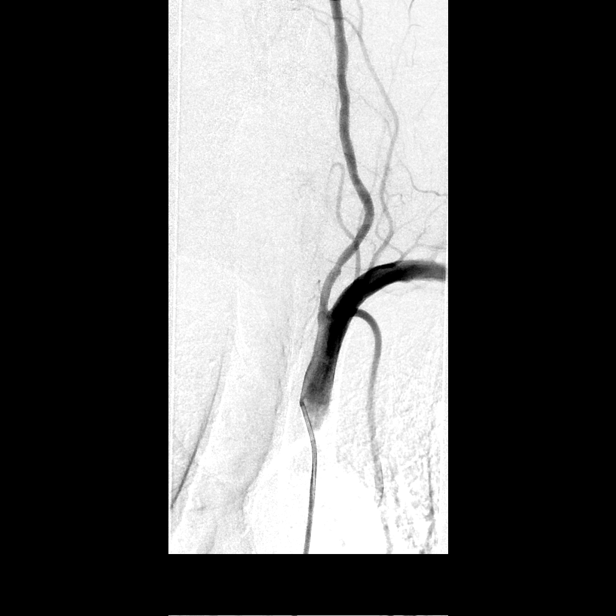
[im 171/180]
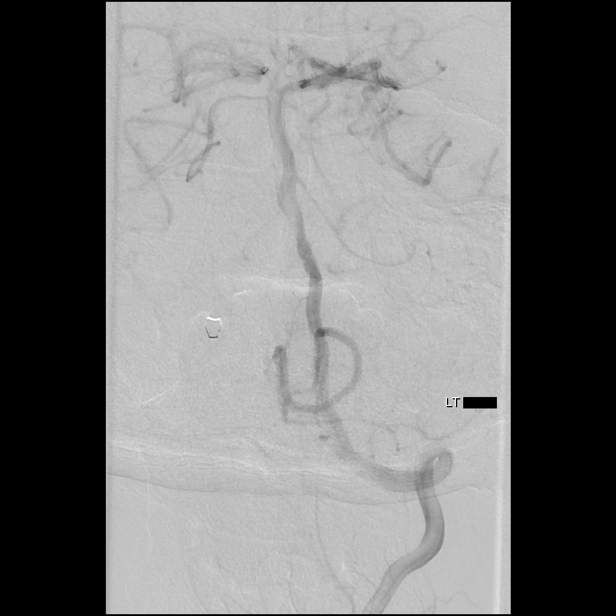

[11 of 24 positions shown; findings below may reference images not displayed]

Following a full explanation of the procedure along with the
potential associated complications, an informed witnessed consent
was obtained.

The left groin was prepped and draped in the usual sterile fashion.
Thereafter using modified Seldinger technique, transfemoral access
into the left common femoral artery was obtained without
difficulty. Over a 0.035" guidewire, a 5-French Pinnacle sheath was
inserted. Through this and also over a 0.035" guidewire, a 5-French
JB1 catheter was advanced to the aortic arch region and selectively
positioned in the right common carotid artery, the right subclavian
artery just proximal to the orifice of the right vertebral artery,
the left common carotid artery and the left subclavian artery just
proximal to the orifice of the left vertebral artery.

There were no acute complications.   The patient tolerated the
procedure well.

Medications utilized: Versed 1 mg IV.  Fentanyl 50 mcg IV.

Contrast utilized: Omnipaque-GSS approximately 55 ml.
FINDINGS: The right subclavian arteriogram demonstrates the origin
of the dominant right vertebral artery to be narrowed by 60-65%.

The vessel otherwise opacifies normally to the cranial skull base.
There is mild atherosclerotic irregularity involving the right
vertebrobasilar junction just proximal to the right posterior
inferior cerebellar artery.

The PICA demonstrates focal areas of caliber irregularity.

Distal to this the right vertebrobasilar junction opacifies into
the basilar artery, the posterior cerebral arteries, superior
cerebellar arteries and the anterior inferior cerebellar arteries
opacify into capillary and venous phases.

Also seen is prompt retrograde opacification via the right
posterior communicating artery of the right internal carotid artery
supraclinoid segment and subsequently the right middle cerebral
artery distribution.

Also seen is delayed leptomeningeal collateralization of the left
parietal cortical and subcortical regions from the left P3 PCA
branches.

The right common carotid arteriogram demonstrates mild narrowing of
the right external carotid artery.  Its branches are normally
opacified.

The right internal carotid artery at the bulb has a small focal
ulceration.  The vessel is otherwise widely patent by NASCET
criteria.

More distally the vessel opacifies to the cranial skull base.

There is a focal outpouching at the right petrous cavernous
junction which measures approximately 3.3 mm.

Again demonstrated is a fusiform prominence of the proximal
cavernous segment of the right internal carotid artery which tapers
to a 50% stenosis in the caval cavernous segment.

Distal to this, the distal cavernous and the supraclinoid segments
are widely patent.

The right middle and the right anterior cerebral arteries opacify
normally into the capillary and venous phases.

Prompt cross opacification via the anterior communicating artery of
the left anterior cerebral artery A1 segment and distally, and the
left middle cerebral artery into the M3-M4 branches of the superior
and inferior divisions is seen.

The left common carotid arteriogram demonstrates the left external
carotid artery and its major branches to be normal.

The left internal carotid artery at the bulb is completely occluded
with a stump.

More distally there is partial reconstitution of a decompressed
left internal carotid artery in the cavernous segment from the
ipsilateral collaterals arising from the left internal maxillary
artery, the ethmoidal nasal branches and subsequently the
ophthalmic artery.  Opacification with antegrade flow is seen in
the supraclinoid left ICA and subsequently into the left middle
cerebral artery, mixing with the unopacified blood via the anterior
communicating artery from the right internal carotid artery
injection.

No evidence of a string sign is seen in the extracranial portion of
the occluded left internal carotid artery.

The dominant left vertebral artery origin is normal.

The vessel opacifies normally to the cranial skull base.

There is normal opacification of the left posterior inferior
cerebellar artery and the left vertebrobasilar junction.

The opacified portions of the left vertebrobasilar junction, the
basilar artery, the posterior cerebral arteries, superior
cerebellar arteries and the anterior inferior cerebellar arteries
is grossly normal into the delayed arterial phase.

Unopacified blood is seen in the basilar artery from the more
dominant contralateral right vertebral artery.

IMPRESSION
1.  Angiographically occluded left internal carotid artery just
distal to the bulb, with partial reconstitution of the left
internal carotid artery in the cavernous segment from the
ipsilateral external carotid artery branches via the ophthalmic
artery as described above.
2.  Stable approximately 50% stenosis of the right internal carotid
artery in the caval cavernous segment.
3.  Stable approximately 3.3 mm right internal carotid artery
petrous cavernous junction aneurysm.
4.  Approximately 60-65% stenoses of the dominant right vertebral
artery origin.

The angiographic findings were reviewed with the patient and the
patient's spouse.

The patient continues to smoke a pack a day.

Again he was warned regarding the potential progression of his
intracranial and extracranial disease.  His diabetes is well-
controlled.

The patient was asked to continue taking his aspirin 325 mg a day,
with Plavix 75 mg a day.  A follow-up MRI/MRA of the brain will be
undertaken in six months' time.  Both of them were asked to call
should he develop worsening of his clinical symptoms or with the
onset of new symptoms.  They both leave with clear understanding
and agreement with the above management plan.

## 2014-11-28 ENCOUNTER — Encounter (HOSPITAL_COMMUNITY): Payer: Self-pay | Admitting: *Deleted

## 2014-11-28 ENCOUNTER — Encounter (HOSPITAL_COMMUNITY)
Admission: RE | Disposition: A | Discharge status: Home / Self Care | Payer: Self-pay | Source: Ambulatory Visit | Attending: Vascular Surgery

## 2014-11-28 ENCOUNTER — Inpatient Hospital Stay (HOSPITAL_COMMUNITY): Payer: Medicaid Other | Admitting: Vascular Surgery

## 2014-11-28 ENCOUNTER — Inpatient Hospital Stay (HOSPITAL_COMMUNITY)
Admission: RE | Admit: 2014-11-28 | Discharge: 2014-11-29 | DRG: 316 | Disposition: A | Discharge status: Home / Self Care | Payer: Medicaid Other | Source: Ambulatory Visit | Attending: Vascular Surgery | Admitting: Vascular Surgery

## 2014-11-28 DIAGNOSIS — F329 Major depressive disorder, single episode, unspecified: Secondary | ICD-10-CM | POA: Diagnosis present

## 2014-11-28 DIAGNOSIS — Z86711 Personal history of pulmonary embolism: Secondary | ICD-10-CM

## 2014-11-28 DIAGNOSIS — E119 Type 2 diabetes mellitus without complications: Secondary | ICD-10-CM | POA: Diagnosis present

## 2014-11-28 DIAGNOSIS — G4730 Sleep apnea, unspecified: Secondary | ICD-10-CM | POA: Diagnosis present

## 2014-11-28 DIAGNOSIS — B1920 Unspecified viral hepatitis C without hepatic coma: Secondary | ICD-10-CM | POA: Diagnosis present

## 2014-11-28 DIAGNOSIS — I739 Peripheral vascular disease, unspecified: Secondary | ICD-10-CM | POA: Diagnosis present

## 2014-11-28 DIAGNOSIS — T82898A Other specified complication of vascular prosthetic devices, implants and grafts, initial encounter: Secondary | ICD-10-CM | POA: Diagnosis not present

## 2014-11-28 DIAGNOSIS — Y832 Surgical operation with anastomosis, bypass or graft as the cause of abnormal reaction of the patient, or of later complication, without mention of misadventure at the time of the procedure: Secondary | ICD-10-CM | POA: Diagnosis present

## 2014-11-28 DIAGNOSIS — F17210 Nicotine dependence, cigarettes, uncomplicated: Secondary | ICD-10-CM | POA: Diagnosis present

## 2014-11-28 DIAGNOSIS — I509 Heart failure, unspecified: Secondary | ICD-10-CM | POA: Diagnosis present

## 2014-11-28 DIAGNOSIS — G894 Chronic pain syndrome: Secondary | ICD-10-CM | POA: Diagnosis present

## 2014-11-28 DIAGNOSIS — J45909 Unspecified asthma, uncomplicated: Secondary | ICD-10-CM | POA: Diagnosis present

## 2014-11-28 DIAGNOSIS — Z79899 Other long term (current) drug therapy: Secondary | ICD-10-CM | POA: Diagnosis not present

## 2014-11-28 DIAGNOSIS — K219 Gastro-esophageal reflux disease without esophagitis: Secondary | ICD-10-CM | POA: Diagnosis present

## 2014-11-28 DIAGNOSIS — Z8614 Personal history of Methicillin resistant Staphylococcus aureus infection: Secondary | ICD-10-CM

## 2014-11-28 DIAGNOSIS — T82858A Stenosis of vascular prosthetic devices, implants and grafts, initial encounter: Principal | ICD-10-CM | POA: Diagnosis present

## 2014-11-28 DIAGNOSIS — I110 Hypertensive heart disease with heart failure: Secondary | ICD-10-CM | POA: Diagnosis present

## 2014-11-28 DIAGNOSIS — E785 Hyperlipidemia, unspecified: Secondary | ICD-10-CM | POA: Diagnosis present

## 2014-11-28 DIAGNOSIS — J449 Chronic obstructive pulmonary disease, unspecified: Secondary | ICD-10-CM | POA: Diagnosis present

## 2014-11-28 DIAGNOSIS — Z8673 Personal history of transient ischemic attack (TIA), and cerebral infarction without residual deficits: Secondary | ICD-10-CM | POA: Diagnosis not present

## 2014-11-28 DIAGNOSIS — M069 Rheumatoid arthritis, unspecified: Secondary | ICD-10-CM | POA: Diagnosis present

## 2014-11-28 HISTORY — PX: FEMORAL-TIBIAL BYPASS GRAFT: SHX938

## 2014-11-28 LAB — GLUCOSE, CAPILLARY
GLUCOSE-CAPILLARY: 126 mg/dL — AB (ref 65–99)
Glucose-Capillary: 98 mg/dL (ref 65–99)
Glucose-Capillary: 102 mg/dL — ABNORMAL HIGH (ref 65–99)
Glucose-Capillary: 102 mg/dL — ABNORMAL HIGH (ref 65–99)

## 2014-11-28 SURGERY — CREATION, BYPASS, ARTERIAL, FEMORAL TO TIBIAL, USING GRAFT
Anesthesia: General | Site: Leg Lower | Laterality: Right

## 2014-11-28 MED ORDER — ACETAMINOPHEN 650 MG RE SUPP: 325.0000 mg | RECTAL | Status: DC | PRN

## 2014-11-28 MED ORDER — CHLORHEXIDINE GLUCONATE CLOTH 2 % EX PADS
6.0000 | MEDICATED_PAD | Freq: Once | CUTANEOUS | Status: DC
Start: 2014-11-28 — End: 2014-11-28

## 2014-11-28 MED ORDER — TRIAMCINOLONE ACETONIDE 0.1 % EX CREA
1.0000 "application " | TOPICAL_CREAM | Freq: Two times a day (BID) | CUTANEOUS | Status: DC | PRN
  Filled 2014-11-28: qty 15

## 2014-11-28 MED ORDER — LEVETIRACETAM 500 MG PO TABS
500.0000 mg | ORAL_TABLET | Freq: Two times a day (BID) | ORAL | Status: DC
  Administered 2014-11-28 – 2014-11-29 (×2): 500 mg via ORAL
  Filled 2014-11-28 (×4): qty 1

## 2014-11-28 MED ORDER — FENTANYL CITRATE (PF) 100 MCG/2ML IJ SOLN
INTRAMUSCULAR | Status: AC
  Filled 2014-11-28: qty 2

## 2014-11-28 MED ORDER — FENTANYL CITRATE (PF) 100 MCG/2ML IJ SOLN
INTRAMUSCULAR | Status: AC
  Administered 2014-11-28: 50 ug via INTRAVENOUS
  Filled 2014-11-28: qty 2

## 2014-11-28 MED ORDER — OXYCODONE-ACETAMINOPHEN 5-325 MG PO TABS
1.0000 | ORAL_TABLET | Freq: Once | ORAL | Status: AC
  Administered 2014-11-28: 1 via ORAL
  Filled 2014-11-28: qty 1

## 2014-11-28 MED ORDER — ENOXAPARIN SODIUM 40 MG/0.4ML ~~LOC~~ SOLN
40.0000 mg | SUBCUTANEOUS | Status: DC
  Administered 2014-11-29: 40 mg via SUBCUTANEOUS
  Filled 2014-11-28 (×2): qty 0.4

## 2014-11-28 MED ORDER — POTASSIUM CHLORIDE CRYS ER 20 MEQ PO TBCR: 20.0000 meq | EXTENDED_RELEASE_TABLET | Freq: Every day | ORAL | Status: DC | PRN

## 2014-11-28 MED ORDER — PROTAMINE SULFATE 10 MG/ML IV SOLN
INTRAVENOUS | Status: DC | PRN
  Administered 2014-11-28 (×3): 10 mg via INTRAVENOUS

## 2014-11-28 MED ORDER — SODIUM CHLORIDE 0.9 % IV SOLN
INTRAVENOUS | Status: DC
  Administered 2014-11-28: 19:00:00 via INTRAVENOUS

## 2014-11-28 MED ORDER — DEXTROSE 5 % IV SOLN
1.5000 g | INTRAVENOUS | Status: AC
  Administered 2014-11-28: 1.5 g via INTRAVENOUS

## 2014-11-28 MED ORDER — FENTANYL CITRATE (PF) 100 MCG/2ML IJ SOLN
25.0000 ug | INTRAMUSCULAR | Status: DC | PRN
  Administered 2014-11-28 (×3): 50 ug via INTRAVENOUS

## 2014-11-28 MED ORDER — HEPARIN SODIUM (PORCINE) 5000 UNIT/ML IJ SOLN
INTRAMUSCULAR | Status: DC | PRN
  Administered 2014-11-28: 500 mL

## 2014-11-28 MED ORDER — CHLORHEXIDINE GLUCONATE CLOTH 2 % EX PADS: 6.0000 | MEDICATED_PAD | Freq: Once | CUTANEOUS | Status: DC

## 2014-11-28 MED ORDER — LIDOCAINE HCL (CARDIAC) 20 MG/ML IV SOLN
INTRAVENOUS | Status: AC
  Filled 2014-11-28: qty 5

## 2014-11-28 MED ORDER — LACTATED RINGERS IV SOLN
INTRAVENOUS | Status: DC
  Administered 2014-11-28 (×4): via INTRAVENOUS

## 2014-11-28 MED ORDER — GUAIFENESIN-DM 100-10 MG/5ML PO SYRP: 15.0000 mL | ORAL_SOLUTION | ORAL | Status: DC | PRN

## 2014-11-28 MED ORDER — ONDANSETRON HCL 4 MG/2ML IJ SOLN: 4.0000 mg | Freq: Four times a day (QID) | INTRAMUSCULAR | Status: DC | PRN

## 2014-11-28 MED ORDER — ESMOLOL HCL 100 MG/10ML IV SOLN
INTRAVENOUS | Status: DC | PRN
  Administered 2014-11-28: 30 mg via INTRAVENOUS
  Administered 2014-11-28 (×2): 20 mg via INTRAVENOUS

## 2014-11-28 MED ORDER — FAMOTIDINE 20 MG PO TABS
20.0000 mg | ORAL_TABLET | Freq: Two times a day (BID) | ORAL | Status: DC
  Administered 2014-11-28 – 2014-11-29 (×2): 20 mg via ORAL
  Filled 2014-11-28 (×4): qty 1

## 2014-11-28 MED ORDER — PROTAMINE SULFATE 10 MG/ML IV SOLN
INTRAVENOUS | Status: AC
  Filled 2014-11-28: qty 5

## 2014-11-28 MED ORDER — ONDANSETRON HCL 4 MG/2ML IJ SOLN: 4.0000 mg | Freq: Once | INTRAMUSCULAR | Status: DC | PRN

## 2014-11-28 MED ORDER — FENTANYL CITRATE (PF) 250 MCG/5ML IJ SOLN
INTRAMUSCULAR | Status: AC
  Filled 2014-11-28: qty 5

## 2014-11-28 MED ORDER — HYDROXYZINE PAMOATE 25 MG PO CAPS
25.0000 mg | ORAL_CAPSULE | Freq: Two times a day (BID) | ORAL | Status: DC
  Administered 2014-11-28: 25 mg via ORAL
  Filled 2014-11-28 (×4): qty 1

## 2014-11-28 MED ORDER — 0.9 % SODIUM CHLORIDE (POUR BTL) OPTIME
TOPICAL | Status: DC | PRN
  Administered 2014-11-28: 2000 mL

## 2014-11-28 MED ORDER — MIDAZOLAM HCL 5 MG/5ML IJ SOLN
INTRAMUSCULAR | Status: DC | PRN
  Administered 2014-11-28: 2 mg via INTRAVENOUS

## 2014-11-28 MED ORDER — HEPARIN SODIUM (PORCINE) 1000 UNIT/ML IJ SOLN
INTRAMUSCULAR | Status: AC
  Filled 2014-11-28: qty 1

## 2014-11-28 MED ORDER — PREGABALIN 75 MG PO CAPS
75.0000 mg | ORAL_CAPSULE | Freq: Three times a day (TID) | ORAL | Status: DC
  Administered 2014-11-28 – 2014-11-29 (×2): 75 mg via ORAL
  Filled 2014-11-28 (×2): qty 1

## 2014-11-28 MED ORDER — FENTANYL CITRATE (PF) 100 MCG/2ML IJ SOLN
INTRAMUSCULAR | Status: DC | PRN
  Administered 2014-11-28 (×2): 100 ug via INTRAVENOUS
  Administered 2014-11-28: 150 ug via INTRAVENOUS
  Administered 2014-11-28 (×4): 50 ug via INTRAVENOUS

## 2014-11-28 MED ORDER — METOPROLOL TARTRATE 1 MG/ML IV SOLN: 2.0000 mg | INTRAVENOUS | Status: DC | PRN

## 2014-11-28 MED ORDER — CLOPIDOGREL BISULFATE 75 MG PO TABS
75.0000 mg | ORAL_TABLET | Freq: Every day | ORAL | Status: DC
  Administered 2014-11-29: 75 mg via ORAL
  Filled 2014-11-28 (×2): qty 1

## 2014-11-28 MED ORDER — ESMOLOL HCL 100 MG/10ML IV SOLN
INTRAVENOUS | Status: AC
  Filled 2014-11-28: qty 10

## 2014-11-28 MED ORDER — HYDROMORPHONE HCL 1 MG/ML IJ SOLN
0.5000 mg | INTRAMUSCULAR | Status: DC | PRN
  Administered 2014-11-28: 0.5 mg via INTRAVENOUS
  Filled 2014-11-28: qty 1

## 2014-11-28 MED ORDER — OXYCODONE-ACETAMINOPHEN 5-325 MG PO TABS
ORAL_TABLET | ORAL | Status: AC
  Filled 2014-11-28: qty 1

## 2014-11-28 MED ORDER — ONDANSETRON HCL 4 MG/2ML IJ SOLN
INTRAMUSCULAR | Status: DC | PRN
Start: 2014-11-28 — End: 2014-11-28
  Administered 2014-11-28: 4 mg via INTRAVENOUS

## 2014-11-28 MED ORDER — ROCURONIUM BROMIDE 100 MG/10ML IV SOLN
INTRAVENOUS | Status: DC | PRN
  Administered 2014-11-28 (×2): 50 mg via INTRAVENOUS
  Administered 2014-11-28: 20 mg via INTRAVENOUS

## 2014-11-28 MED ORDER — DEXTROSE 5 % IV SOLN
INTRAVENOUS | Status: AC
  Filled 2014-11-28: qty 1.5

## 2014-11-28 MED ORDER — HYDRALAZINE HCL 20 MG/ML IJ SOLN: 5.0000 mg | INTRAMUSCULAR | Status: DC | PRN

## 2014-11-28 MED ORDER — PHENYLEPHRINE HCL 10 MG/ML IJ SOLN
10.0000 mg | INTRAVENOUS | Status: DC | PRN
  Administered 2014-11-28: 50 ug/min via INTRAVENOUS

## 2014-11-28 MED ORDER — SUGAMMADEX SODIUM 200 MG/2ML IV SOLN
INTRAVENOUS | Status: AC
  Filled 2014-11-28: qty 2

## 2014-11-28 MED ORDER — LIDOCAINE HCL (CARDIAC) 20 MG/ML IV SOLN
INTRAVENOUS | Status: DC | PRN
  Administered 2014-11-28: 40 mg via INTRAVENOUS

## 2014-11-28 MED ORDER — DOCUSATE SODIUM 100 MG PO CAPS
100.0000 mg | ORAL_CAPSULE | Freq: Every day | ORAL | Status: DC
  Administered 2014-11-29: 100 mg via ORAL
  Filled 2014-11-28: qty 1

## 2014-11-28 MED ORDER — ALUM & MAG HYDROXIDE-SIMETH 200-200-20 MG/5ML PO SUSP: 15.0000 mL | ORAL | Status: DC | PRN

## 2014-11-28 MED ORDER — SUGAMMADEX SODIUM 200 MG/2ML IV SOLN
INTRAVENOUS | Status: DC | PRN
  Administered 2014-11-28: 200 mg via INTRAVENOUS

## 2014-11-28 MED ORDER — PROPOFOL 10 MG/ML IV BOLUS
INTRAVENOUS | Status: DC | PRN
  Administered 2014-11-28: 50 mg via INTRAVENOUS
  Administered 2014-11-28: 130 mg via INTRAVENOUS

## 2014-11-28 MED ORDER — ACETAMINOPHEN 325 MG PO TABS: 325.0000 mg | ORAL_TABLET | ORAL | Status: DC | PRN

## 2014-11-28 MED ORDER — ONDANSETRON HCL 4 MG/2ML IJ SOLN
INTRAMUSCULAR | Status: AC
  Filled 2014-11-28: qty 2

## 2014-11-28 MED ORDER — MAGNESIUM SULFATE 2 GM/50ML IV SOLN: 2.0000 g | Freq: Every day | INTRAVENOUS | Status: DC | PRN

## 2014-11-28 MED ORDER — MIDAZOLAM HCL 2 MG/2ML IJ SOLN
INTRAMUSCULAR | Status: AC
  Filled 2014-11-28: qty 2

## 2014-11-28 MED ORDER — CEFUROXIME SODIUM 1.5 G IJ SOLR
1.5000 g | Freq: Two times a day (BID) | INTRAMUSCULAR | Status: DC
  Administered 2014-11-29: 1.5 g via INTRAVENOUS
  Filled 2014-11-28 (×2): qty 1.5

## 2014-11-28 MED ORDER — SODIUM CHLORIDE 0.9 % IV SOLN: 500.0000 mL | Freq: Once | INTRAVENOUS | Status: DC | PRN

## 2014-11-28 MED ORDER — OXYCODONE-ACETAMINOPHEN 5-325 MG PO TABS
1.0000 | ORAL_TABLET | ORAL | Status: DC | PRN
  Administered 2014-11-28 – 2014-11-29 (×4): 2 via ORAL
  Filled 2014-11-28 (×4): qty 2

## 2014-11-28 MED ORDER — SODIUM CHLORIDE 0.9 % IV SOLN: INTRAVENOUS | Status: DC

## 2014-11-28 MED ORDER — PANTOPRAZOLE SODIUM 40 MG PO TBEC
40.0000 mg | DELAYED_RELEASE_TABLET | Freq: Every day | ORAL | Status: DC
  Administered 2014-11-29: 40 mg via ORAL
  Filled 2014-11-28: qty 1

## 2014-11-28 MED ORDER — LABETALOL HCL 5 MG/ML IV SOLN: 10.0000 mg | INTRAVENOUS | Status: DC | PRN

## 2014-11-28 MED ORDER — HEPARIN SODIUM (PORCINE) 1000 UNIT/ML IJ SOLN
INTRAMUSCULAR | Status: DC | PRN
  Administered 2014-11-28: 7000 [IU] via INTRAVENOUS

## 2014-11-28 MED ORDER — ROCURONIUM BROMIDE 50 MG/5ML IV SOLN
INTRAVENOUS | Status: AC
  Filled 2014-11-28: qty 1

## 2014-11-28 MED ORDER — PHENOL 1.4 % MT LIQD: 1.0000 | OROMUCOSAL | Status: DC | PRN

## 2014-11-28 MED ORDER — ALBUTEROL SULFATE (2.5 MG/3ML) 0.083% IN NEBU: 2.5000 mg | INHALATION_SOLUTION | Freq: Four times a day (QID) | RESPIRATORY_TRACT | Status: DC | PRN

## 2014-11-28 SURGICAL SUPPLY — 61 items
APL SKNCLS STERI-STRIP NONHPOA (GAUZE/BANDAGES/DRESSINGS) ×1
BAG ISL DRAPE 18X18 STRL (DRAPES) ×1
BAG ISOLATION DRAPE 18X18 (DRAPES) IMPLANT
BANDAGE ESMARK 6X9 LF (GAUZE/BANDAGES/DRESSINGS) IMPLANT
BENZOIN TINCTURE PRP APPL 2/3 (GAUZE/BANDAGES/DRESSINGS) ×3 IMPLANT
BNDG CMPR 9X6 STRL LF SNTH (GAUZE/BANDAGES/DRESSINGS) ×1
BNDG ESMARK 6X9 LF (GAUZE/BANDAGES/DRESSINGS) ×3
CANISTER SUCTION 2500CC (MISCELLANEOUS) ×3 IMPLANT
CANNULA VESSEL 3MM 2 BLNT TIP (CANNULA) ×6 IMPLANT
CLIP LIGATING EXTRA MED SLVR (CLIP) ×3 IMPLANT
CLIP LIGATING EXTRA SM BLUE (MISCELLANEOUS) ×3 IMPLANT
CLOSURE WOUND 1/2 X4 (GAUZE/BANDAGES/DRESSINGS) ×1
CUFF TOURNIQUET SINGLE 24IN (TOURNIQUET CUFF) ×2 IMPLANT
CUFF TOURNIQUET SINGLE 34IN LL (TOURNIQUET CUFF) IMPLANT
CUFF TOURNIQUET SINGLE 44IN (TOURNIQUET CUFF) IMPLANT
DRAIN SNY 10X20 3/4 PERF (WOUND CARE) IMPLANT
DRAPE ISOLATION BAG 18X18 (DRAPES) ×2
DRAPE PROXIMA HALF (DRAPES) IMPLANT
DRAPE X-RAY CASS 24X20 (DRAPES) IMPLANT
DRSG COVADERM 4X10 (GAUZE/BANDAGES/DRESSINGS) IMPLANT
DRSG COVADERM 4X8 (GAUZE/BANDAGES/DRESSINGS) IMPLANT
ELECT REM PT RETURN 9FT ADLT (ELECTROSURGICAL) ×3
ELECTRODE REM PT RTRN 9FT ADLT (ELECTROSURGICAL) ×1 IMPLANT
EVACUATOR SILICONE 100CC (DRAIN) IMPLANT
GLOVE BIO SURGEON STRL SZ 6.5 (GLOVE) ×2 IMPLANT
GLOVE BIO SURGEON STRL SZ7 (GLOVE) ×2 IMPLANT
GLOVE BIO SURGEONS STRL SZ 6.5 (GLOVE) ×2
GLOVE BIOGEL PI IND STRL 6.5 (GLOVE) IMPLANT
GLOVE BIOGEL PI IND STRL 7.5 (GLOVE) IMPLANT
GLOVE BIOGEL PI INDICATOR 6.5 (GLOVE) ×8
GLOVE BIOGEL PI INDICATOR 7.5 (GLOVE) ×2
GLOVE ECLIPSE 6.5 STRL STRAW (GLOVE) ×2 IMPLANT
GLOVE SS BIOGEL STRL SZ 7.5 (GLOVE) ×1 IMPLANT
GLOVE SUPERSENSE BIOGEL SZ 7.5 (GLOVE) ×2
GOWN STRL REUS W/ TWL LRG LVL3 (GOWN DISPOSABLE) ×3 IMPLANT
GOWN STRL REUS W/TWL LRG LVL3 (GOWN DISPOSABLE) ×15
INSERT FOGARTY SM (MISCELLANEOUS) IMPLANT
KIT BASIN OR (CUSTOM PROCEDURE TRAY) ×3 IMPLANT
KIT ROOM TURNOVER OR (KITS) ×3 IMPLANT
NS IRRIG 1000ML POUR BTL (IV SOLUTION) ×6 IMPLANT
PACK PERIPHERAL VASCULAR (CUSTOM PROCEDURE TRAY) ×3 IMPLANT
PAD ARMBOARD 7.5X6 YLW CONV (MISCELLANEOUS) ×6 IMPLANT
PADDING CAST COTTON 6X4 STRL (CAST SUPPLIES) ×2 IMPLANT
SET COLLECT BLD 21X3/4 12 (NEEDLE) IMPLANT
SPONGE GAUZE 4X4 12PLY STER LF (GAUZE/BANDAGES/DRESSINGS) ×2 IMPLANT
SPONGE LAP 18X18 X RAY DECT (DISPOSABLE) ×2 IMPLANT
STAPLER VISISTAT 35W (STAPLE) IMPLANT
STOPCOCK 4 WAY LG BORE MALE ST (IV SETS) IMPLANT
STRIP CLOSURE SKIN 1/2X4 (GAUZE/BANDAGES/DRESSINGS) ×2 IMPLANT
SUT ETHILON 3 0 PS 1 (SUTURE) IMPLANT
SUT PROLENE 5 0 C 1 24 (SUTURE) ×3 IMPLANT
SUT PROLENE 6 0 CC (SUTURE) ×9 IMPLANT
SUT SILK 2 0 SH (SUTURE) ×3 IMPLANT
SUT VIC AB 2-0 CTX 36 (SUTURE) ×6 IMPLANT
SUT VIC AB 3-0 SH 27 (SUTURE) ×6
SUT VIC AB 3-0 SH 27X BRD (SUTURE) ×2 IMPLANT
TAPE CLOTH SURG 4X10 WHT LF (GAUZE/BANDAGES/DRESSINGS) ×2 IMPLANT
TRAY FOLEY W/METER SILVER 16FR (SET/KITS/TRAYS/PACK) ×3 IMPLANT
TUBING EXTENTION W/L.L. (IV SETS) IMPLANT
UNDERPAD 30X30 INCONTINENT (UNDERPADS AND DIAPERS) ×3 IMPLANT
WATER STERILE IRR 1000ML POUR (IV SOLUTION) ×3 IMPLANT
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
## 2014-11-28 NOTE — Anesthesia Preprocedure Evaluation (Signed)
Anesthesia Evaluation  Patient identified by MRN, date of birth, ID band Patient awake    Reviewed: Allergy & Precautions, NPO status , Patient's Chart, lab work & pertinent test results  Airway Mallampati: II   Neck ROM: Full    Dental  (+) Dental Advisory Given, Poor Dentition, Edentulous Upper   Pulmonary Current Smoker,    breath sounds clear to auscultation       Cardiovascular hypertension,  Rhythm:Regular Rate:Normal     Neuro/Psych    GI/Hepatic   Endo/Other  diabetes  Renal/GU      Musculoskeletal   Abdominal   Peds  Hematology   Anesthesia Other Findings   Reproductive/Obstetrics                             Anesthesia Physical Anesthesia Plan  ASA: III  Anesthesia Plan: General   Post-op Pain Management:    Induction: Intravenous  Airway Management Planned: Oral ETT  Additional Equipment:   Intra-op Plan:   Post-operative Plan: Extubation in OR  Informed Consent: I have reviewed the patients History and Physical, chart, labs and discussed the procedure including the risks, benefits and alternatives for the proposed anesthesia with the patient or authorized representative who has indicated his/her understanding and acceptance.   Dental advisory given  Plan Discussed with: CRNA and Anesthesiologist  Anesthesia Plan Comments: (PVD with persistent ischemia R. LE H/O IV drug abuse S/P brain abscess 12/05/13 Sleep apnea refuses to use C-PAP COPD Smoker  Plan GA with oral ETT  Roberts Gaudy   )        Anesthesia Quick Evaluation

## 2014-11-28 NOTE — Interval H&P Note (Signed)
History and Physical Interval Note:  11/28/2014 10:21 AM  [name]  has presented today for surgery, with the diagnosis of Peripheral vascular disease with right lower extremity rest pain I70.221  The various methods of treatment have been discussed with the patient and family. After consideration of risks, benefits and other options for treatment, the patient has consented to  Procedure(s): REVISION BYPASS GRAFT FEMORAL-TIBIAL ARTERY (Right) as a surgical intervention .  The patient's history has been reviewed, patient examined, no change in status, stable for surgery.  I have reviewed the patient's chart and labs.  Questions were answered to the patient's satisfaction.     Curt Jews

## 2014-11-28 NOTE — Progress Notes (Signed)
     PACU doppler DP weak PT right LE BP low 87/54 Incisions dry without hematoma  S/P REVISION Right leg FEMORAL-TIBIAL Bypass graft with interposition of small saphenous vein using left leg vein graft (Right)  Disposition stable receiving fluids Fancy Dunkley MAUREEN PA-C

## 2014-11-28 NOTE — Anesthesia Procedure Notes (Signed)
Procedure Name: Intubation Date/Time: 11/28/2014 1:17 PM Performed by: Greggory Stallion, Avani Sensabaugh L Pre-anesthesia Checklist: Patient identified, Emergency Drugs available, Suction available, Patient being monitored and Timeout performed Patient Re-evaluated:Patient Re-evaluated prior to inductionOxygen Delivery Method: Circle system utilized Preoxygenation: Pre-oxygenation with 100% oxygen Intubation Type: IV induction and Cricoid Pressure applied Ventilation: Mask ventilation with difficulty and Oral airway inserted - appropriate to patient size Laryngoscope Size: Mac and 4 Grade View: Grade II Tube type: Oral Tube size: 7.5 mm Number of attempts: 1 Airway Equipment and Method: Stylet Placement Confirmation: ETT inserted through vocal cords under direct vision,  breath sounds checked- equal and bilateral and positive ETCO2 Secured at: 21 cm Tube secured with: Tape Dental Injury: Teeth and Oropharynx as per pre-operative assessment

## 2014-11-28 NOTE — Transfer of Care (Signed)
Immediate Anesthesia Transfer of Care Note  Patient: [name]  Procedure(s) Performed: Procedure(s): REVISION Right leg  FEMORAL-TIBIAL Bypass graft with interposition of small saphenous vein using left leg vein graft (Right)  Patient Location: PACU  Anesthesia Type:General  Level of Consciousness: awake, alert , oriented and patient cooperative  Airway & Oxygen Therapy: Patient Spontanous Breathing and Patient connected to face mask oxygen  Post-op Assessment: Report given to RN and Post -op Vital signs reviewed and stable  Post vital signs: Reviewed and stable  Last Vitals:  Filed Vitals:   11/28/14 1007  BP: 166/72  Pulse: 75  Temp: 36.6 C  Resp: 16    Complications: No apparent anesthesia complications

## 2014-11-28 NOTE — Anesthesia Postprocedure Evaluation (Signed)
  Anesthesia Post-op Note  Patient: [name]  Procedure(s) Performed: Procedure(s): REVISION Right leg  FEMORAL-TIBIAL Bypass graft with interposition of small saphenous vein using left leg vein graft (Right)  Patient Location: PACU  Anesthesia Type:General  Level of Consciousness: awake, alert  and oriented  Airway and Oxygen Therapy: Patient Spontanous Breathing and Patient connected to nasal cannula oxygen  Post-op Pain: mild  Post-op Assessment: Post-op Vital signs reviewed, Patient's Cardiovascular Status Stable, Respiratory Function Stable, Patent Airway and Pain level controlled              Post-op Vital Signs: stable  Last Vitals:  Filed Vitals:   11/28/14 1007  BP: 166/72  Pulse: 75  Temp: 36.6 C  Resp: 16    Complications: No apparent anesthesia complications

## 2014-11-29 ENCOUNTER — Inpatient Hospital Stay (HOSPITAL_COMMUNITY): Payer: Medicaid Other

## 2014-11-29 ENCOUNTER — Encounter (HOSPITAL_COMMUNITY): Payer: Self-pay | Admitting: Vascular Surgery

## 2014-11-29 LAB — CBC
HEMATOCRIT: 38.9 % — AB (ref 39.0–52.0)
Hemoglobin: 12.9 g/dL — ABNORMAL LOW (ref 13.0–17.0)
MCH: 31.5 pg (ref 26.0–34.0)
MCHC: 33.2 g/dL (ref 30.0–36.0)
MCV: 94.9 fL (ref 78.0–100.0)
Platelets: 153 10*3/uL (ref 150–400)
RBC: 4.1 MIL/uL — ABNORMAL LOW (ref 4.22–5.81)
RDW: 14 % (ref 11.5–15.5)
WBC: 6.4 10*3/uL (ref 4.0–10.5)

## 2014-11-29 LAB — BASIC METABOLIC PANEL
Anion gap: 5 (ref 5–15)
BUN: 12 mg/dL (ref 6–20)
CALCIUM: 8.5 mg/dL — AB (ref 8.9–10.3)
CO2: 29 mmol/L (ref 22–32)
CREATININE: 0.92 mg/dL (ref 0.61–1.24)
Chloride: 106 mmol/L (ref 101–111)
GFR calc Af Amer: >60 mL/min (ref >60)
GFR calc non Af Amer: >60 mL/min (ref >60)
GLUCOSE: 129 mg/dL — AB (ref 65–99)
Potassium: 4.5 mmol/L (ref 3.5–5.1)
Sodium: 140 mmol/L (ref 135–145)

## 2014-11-29 MED ORDER — OXYCODONE-ACETAMINOPHEN 5-325 MG PO TABS: 1.0000 | ORAL_TABLET | ORAL | Status: DC | PRN

## 2014-11-29 MED ORDER — HYDROXYZINE HCL 25 MG PO TABS
25.0000 mg | ORAL_TABLET | Freq: Two times a day (BID) | ORAL | Status: DC
Start: 2014-11-29 — End: 2014-11-29
  Filled 2014-11-29: qty 1
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
## 2014-11-29 NOTE — Progress Notes (Signed)
PT Cancellation Note  Patient Details Name: [name] MRN: [id number] DOB: 05/31/1960   Cancelled Treatment:    Reason Eval/Treat Not Completed: PT screened, no needs identified, will sign off. Pt is ambulating independently in hallway as well as ascend/descend steps in stairwell. No PT intervention indicated. PT signing off.   Lorriane Shire 11/29/2014, 8:54 AM

## 2014-11-29 NOTE — Progress Notes (Signed)
Utilization review completed. Meagon Duskin, RN, BSN. 

## 2014-11-29 NOTE — Progress Notes (Addendum)
[facility] Progress Note  Subjective  - POD #1  Wants to go home. Pain well controlled. Having some burning at incisions.   Objective Filed Vitals:   11/29/14 0500  BP: 108/58  Pulse: 67  Temp:   Resp: 10    Intake/Output Summary (Last 24 hours) at 11/29/14 0741 Last data filed at 11/29/14 0500  Gross per 24 hour  Intake   2200 ml  Output   2430 ml  Net   -230 ml   Left posterior calf vein harvest site clean and dry. Right lateral calf incision intact with steri-strips. No active drainage. Biphasic doppler flow right PT and DP.   Assessment/Planning: 54 y.o. male is s/p: revision right leg femoral-tibial bypass graft with interposition of small saphenous vein using left leg vein harvest.   1 Day Post-Op   Bypass is patent. Good doppler flow right DP and PT.  Incisions are fine. BP is stable this am. Was hypotensive in PACU yesterday. Has voided this am. Will need to ambulate in halls. Patient wanting to go home. If able to ambulate in halls and pain well controlled on po pain meds will discharge this afternoon.   [name] 11/29/2014 7:41 AM --  Laboratory CBC    Component Value Date/Time   WBC 6.4 11/29/2014 0529   HGB 12.9* 11/29/2014 0529   HCT 38.9* 11/29/2014 0529   PLT 153 11/29/2014 0529    BMET    Component Value Date/Time   NA 140 11/29/2014 0529   K 4.5 11/29/2014 0529   CL 106 11/29/2014 0529   CO2 29 11/29/2014 0529   GLUCOSE 129* 11/29/2014 0529   BUN 12 11/29/2014 0529   CREATININE 0.92 11/29/2014 0529   CREATININE 0.85 05/10/2014 1114   CALCIUM 8.5* 11/29/2014 0529   CALCIUM 9.1 10/28/2009 2118   GFRNONAA >60 11/29/2014 0529   GFRNONAA >89 02/01/2014 1518   GFRAA >60 11/29/2014 0529   GFRAA >89 02/01/2014 1518    COAG Lab Results  Component Value Date   INR 1.08 11/21/2014   INR 1.03 08/15/2014   INR 1.15 07/01/2014   No results found for: PTT  Antibiotics Anti-infectives    Start     Dose/Rate  Route Frequency Ordered Stop   11/29/14 0100  cefUROXime (ZINACEF) 1.5 g in dextrose 5 % 50 mL IVPB     1.5 g 100 mL/hr over 30 Minutes Intravenous Every 12 hours 11/28/14 1856 11/30/14 0059   11/28/14 1015  cefUROXime (ZINACEF) 1.5 g in dextrose 5 % 50 mL IVPB     1.5 g 100 mL/hr over 30 Minutes Intravenous 30 min pre-op 11/28/14 1015 11/28/14 1325   11/28/14 1015  dextrose 5 % with cefUROXime (ZINACEF) ADS Med    Comments:  [name]   : cabinet override      11/28/14 1015 11/28/14 2229       Virgina Jock, PA-C [facility] Office: [phone] Pager: [phone] 11/29/2014 7:41 AM     I have examined the patient, reviewed and agree with above. Stable for discharge. He is in a pain management agreement as an outpatient. He reports that he has no pain medication left. He reports that if he gets narcotics from another source that he will be discharged from pain management. I explained that it would be his decision. He continued to talk with his pain management physician regarding increasing needs following surgery or weekends give him a prescription for 30 Percocet 5/325. He requests  prescription prior to discharge. I will see him back in the office in 2 weeks for follow-up  Curt Jews, MD 11/29/2014 8:40 AM

## 2014-11-29 NOTE — Progress Notes (Signed)
OT Cancellation Note/Discharge  Patient Details Name: [name] MRN: [id number] DOB: 1960/11/05   Cancelled Treatment:    Reason Eval/Treat Not Completed: OT screened, no needs identified, will sign off  [facility], OTR/L  [id number] 11/29/2014 11/29/2014, 9:45 AM

## 2014-11-29 NOTE — Progress Notes (Signed)
Discussed and explained discharge instructions, prescriptions, f/u appt given to pt. Pt refused w/c ambulated out of unit to go home. Talking to someone on cellphone about coming to get him for transportation.

## 2014-12-03 ENCOUNTER — Telehealth: Payer: Self-pay | Admitting: Vascular Surgery

## 2014-12-03 NOTE — Telephone Encounter (Addendum)
-----   Message from Denman George, RN sent at 11/29/2014 10:27 AM EST ----- Regarding: Zigmund Daniel log; also needs 2 wk. f/u with Dr. Donnetta Hutching   ----- Message -----    From: Alvia Grove, PA-C    Sent: 11/29/2014   7:46 AM      To: Vvs Charge Pool  S/p revision right leg femoral-tibial bypass graft with interposition of small saphenous vein using left leg vein harvest 11/28/14  F/u with Dr. Donnetta Hutching in 2 weeks  Thanks Maudie Mercury  notified patient of post op appt. on 12-25-14 at 3:45 with dr. early

## 2014-12-05 ENCOUNTER — Telehealth: Payer: Self-pay

## 2014-12-05 NOTE — Telephone Encounter (Signed)
Rec'd phone call from pt.  C/o "pinching-type pain" in back of left leg, near the incision, when he walks.  Described as "feeling like 2 mosquitos biting me at the same time."  Stated the discomfort eases up, when he stops walking; also denies the discomfort with rest.  Denied any redness or inflammation in the left LE incisional area.  Reported the swelling of the leg has "improved a lot."  Denied fever/ chills.  Stated the incisions of right leg look okay.  Denied any incisional drainage.  Advised it may be related to the flexing of the calf muscle with walking, and causing a stretching of the incisional area, @ vein harvest site.  Adv. to continue to monitor, and report any worsening of symptoms.  Encouraged to elevate legs above level of heart several times/ day, in addition to walking intermittently.  Pt. knows to call office if symptoms don't improve.

## 2014-12-13 ENCOUNTER — Other Ambulatory Visit: Payer: Self-pay | Admitting: Internal Medicine

## 2014-12-13 ENCOUNTER — Telehealth: Payer: Self-pay

## 2014-12-13 DIAGNOSIS — G8918 Other acute postprocedural pain: Secondary | ICD-10-CM

## 2014-12-13 MED ORDER — OXYCODONE-ACETAMINOPHEN 5-325 MG PO TABS: 1.0000 | ORAL_TABLET | ORAL | Status: DC | PRN

## 2014-12-13 NOTE — Telephone Encounter (Signed)
Phone call from pt.  Requested refill on Percocet.  Reported his pain is in the incision and in right foot.  Stated the foot pain was there prior to surgery, and hasn't improved.  Reported pain level at 10/10 @ night.  Denied fever/ chills.  Denied any separation of the incision; denied drainage.  Stated the swelling of the right leg is much improved, but there is still "some swelling".  Reported his able to tolerate walking better, and that the burning of the right LE has improved with walking.  Reported he is taking the Percocet 2 tabs q 4-5 hrs.  Discussed with Dr. Oneida Alar.  Gave authorization to refill Percocet 5/325 mg; # 30; no refills.  Notified pt. of prescription available to pick up at front desk.

## 2014-12-19 ENCOUNTER — Encounter: Payer: Self-pay | Admitting: Vascular Surgery

## 2014-12-20 ENCOUNTER — Ambulatory Visit (HOSPITAL_COMMUNITY): Payer: Medicaid Other

## 2014-12-25 ENCOUNTER — Encounter: Payer: Self-pay | Admitting: Vascular Surgery

## 2014-12-25 ENCOUNTER — Ambulatory Visit (INDEPENDENT_AMBULATORY_CARE_PROVIDER_SITE_OTHER): Payer: Self-pay | Admitting: Vascular Surgery

## 2014-12-25 VITALS — BP 110/64 | HR 95 | Temp 98.3°F | Ht 71.5 in | Wt 171.0 lb

## 2014-12-25 DIAGNOSIS — I739 Peripheral vascular disease, unspecified: Secondary | ICD-10-CM

## 2014-12-25 MED ORDER — CEPHALEXIN 500 MG PO CAPS: 500.0000 mg | ORAL_CAPSULE | Freq: Three times a day (TID) | ORAL | Status: DC

## 2014-12-25 MED ORDER — OXYCODONE-ACETAMINOPHEN 10-325 MG PO TABS: 1.0000 | ORAL_TABLET | Freq: Four times a day (QID) | ORAL | Status: DC | PRN

## 2014-12-25 NOTE — Progress Notes (Signed)
POST OPERATIVE OFFICE NOTE    CC:  F/u for surgery  HPI:  This is a 54 y.o. male who is s/p right femoral to AT vein bypass with vein taken from the left leg in April 2016. In November, he was taken back to the OR and underwent revision of right femoral to AT bypass with interposition small saphenous vein from the left leg.  He also has hx of occlusion of left CIA with angioplasty and relief of his sx in July 2016.  He states that his right leg is doing great and the numbness in his right foot has resolved.  He states he thinks he has an infection of the left leg incision.   He states that he has been showering daily and keeping the wounds dry.   Allergies  Allergen Reactions  . Buprenorphine Hcl-Naloxone Hcl Shortness Of Breath and Other (See Comments)    "feel like going to die" jittery, trouble breathing, feels hot (reaction to Suboxone)  . Ciprofloxacin Anaphylaxis  . Embeda [Morphine-Naltrexone] Other (See Comments)    seizures  . Shellfish-Derived Products Anaphylaxis, Hives and Swelling  . Fish Allergy Hives, Swelling and Rash  . Benadryl [Diphenhydramine Hcl] Hives, Itching and Rash  . Diphenhydramine Hcl Hives, Itching and Rash  . Vicodin [Hydrocodone-Acetaminophen] Other (See Comments)    Nausea only    Current Outpatient Prescriptions  Medication Sig Dispense Refill  . albuterol (PROVENTIL) (2.5 MG/3ML) 0.083% nebulizer solution Take 2.5 mg by nebulization every 6 (six) hours as needed for wheezing or shortness of breath.    . clopidogrel (PLAVIX) 75 MG tablet Take 75 mg by mouth daily.    . diclofenac sodium (VOLTAREN) 1 % GEL Apply 1 application topically as needed.    . Gabapentin Enacarbil (HORIZANT) 600 MG TBCR Take 600 mg by mouth 2 (two) times daily.    . hydrOXYzine (VISTARIL) 25 MG capsule Take 25 mg by mouth 2 (two) times daily. Itching    . levETIRAcetam (KEPPRA) 500 MG tablet Take 1 tablet (500 mg total) by mouth 2 (two) times daily. 60 tablet 0  .  oxyCODONE-acetaminophen (PERCOCET/ROXICET) 5-325 MG tablet Take 1-2 tablets by mouth every 4 (four) hours as needed for moderate pain or severe pain. 30 tablet 0  . pantoprazole (PROTONIX) 40 MG tablet Take 40 mg by mouth daily.    . pregabalin (LYRICA) 75 MG capsule Take 1 capsule (75 mg total) by mouth 3 (three) times daily. 90 capsule 3  . ranitidine (ZANTAC) 150 MG tablet Take 1 tablet (150 mg total) by mouth every 12 (twelve) hours as needed for heartburn. 60 tablet 11  . triamcinolone cream (KENALOG) 0.1 % Apply 1 application topically 2 (two) times daily as needed (Leg itch).    . [DISCONTINUED] topiramate (TOPAMAX) 25 MG tablet Take 25 mg by mouth 2 (two) times daily.       No current facility-administered medications for this visit.     ROS:  See HPI  Physical Exam:  Filed Vitals:   12/25/14 1553  BP: 110/64  Pulse: 95  Temp: 98.3 F (36.8 C)    Incision:  Right leg incision is well healed with a spitting stitch proximally;  Left posterior incision has erythema and is scabbed with mild separation of the incision  Extremities:  Easily palpable right graft pulse as well as easily palpable right DP pulse.   Assessment/Plan:  This is a 54 y.o. male who is s/p: revision of right femoral to AT bypass with interposition  small saphenous vein from the left leg November 28, 2014  -graft is patent with easily palpable graft pulse and palpable right DP -right leg incision is healing nicely -left posterior calf incision with some scabs, which should resolve, however, he does have some erythema and tenderness around this incision.  Will give Keflex 500mg  tid x 2 weeks  -he is also given and Rx for Percocet 10/325 one po q6h prn pain #30 (thirty) no refill.    [name], PA-C [facility] [phone]  Clinic MD:  Pt seen and examined with Dr. Donnetta Hutching  I have examined the patient, reviewed and agree with above.  Curt Jews, MD 12/25/2014 4:49 PM

## 2014-12-27 ENCOUNTER — Ambulatory Visit (HOSPITAL_COMMUNITY): Admission: RE | Admit: 2014-12-27 | Payer: Medicaid Other | Source: Ambulatory Visit

## 2015-01-08 ENCOUNTER — Telehealth: Payer: Self-pay | Admitting: *Deleted

## 2015-01-08 NOTE — Telephone Encounter (Signed)
Patient called to report that he thinks his left leg harvest graft incision is still "infected". He states that he completed the Keflex Rx given to him on 12-25-14. He says that he is "having a lot of soreness and pain" in the area, slight clear incisional drainage and that his leg is swollen from the knee down to the foot. He says that "he elevates his leg sometimes but doesn't do it that much" Pain level is reported as 10/10.  Patient states that he doesn't know if he has a fever or not but he is having a lot of nausea. I told him that I would set him up with an appt with our nurse practioner for a wound check. Patient was not interested in this appt and only wanted to see Dr. Donnetta Hutching. I told him that Dr. Donnetta Hutching was not in the office this week (patient has postop appt on 01-22-15 with Dr. Donnetta Hutching).  He said that he would "try to wait until then but if he couldn't he would go to the ED for help"  I again offered a wound check appt but patient declined.   I checked the [place] Database and patient received 3 scripts for pain medication from 12-13-14 to 12-25-14:     12-13-14 Dr. Oneida Alar:  Percocet 5-325mg  #30      12-19-14 Dr. Ricke Hey Percocet 5-325mg  #90  (Patient has gotten #90 monthly from Dr. Alyson Ingles since 10-22-14)        12-25-14 [name], PA 10-325mg  #30

## 2015-01-09 ENCOUNTER — Other Ambulatory Visit: Payer: Self-pay | Admitting: Internal Medicine

## 2015-01-18 ENCOUNTER — Encounter: Payer: Self-pay | Admitting: Vascular Surgery

## 2015-01-22 ENCOUNTER — Encounter: Payer: Self-pay | Admitting: Vascular Surgery

## 2015-01-22 ENCOUNTER — Ambulatory Visit (INDEPENDENT_AMBULATORY_CARE_PROVIDER_SITE_OTHER): Payer: Medicaid Other | Admitting: Vascular Surgery

## 2015-01-22 VITALS — BP 111/66 | HR 93 | Temp 97.2°F | Ht 71.5 in | Wt 178.0 lb

## 2015-01-22 DIAGNOSIS — I739 Peripheral vascular disease, unspecified: Secondary | ICD-10-CM

## 2015-01-22 NOTE — Progress Notes (Signed)
POST OPERATIVE OFFICE NOTE    CC:  F/u for surgery  HPI:  This is a 55 y.o. male who is s/p revision of right femoral to AT bypass with interposition small saphenous vein from the left leg November 28, 2014.  He returned in December with c/o pain at the lesser saphenous vein harvest site.  He was given a course of antibiotics and he is following up today.  He states that his right leg is doing great without any pain.  He continues to have soreness in the left leg at harvest site.  He continues to smoke.   Allergies  Allergen Reactions  . Buprenorphine Hcl-Naloxone Hcl Shortness Of Breath and Other (See Comments)    "feel like going to die" jittery, trouble breathing, feels hot (reaction to Suboxone)  . Ciprofloxacin Anaphylaxis  . Embeda [Morphine-Naltrexone] Other (See Comments)    seizures  . Shellfish-Derived Products Anaphylaxis, Hives and Swelling  . Fish Allergy Hives, Swelling and Rash  . Benadryl [Diphenhydramine Hcl] Hives, Itching and Rash  . Diphenhydramine Hcl Hives, Itching and Rash  . Vicodin [Hydrocodone-Acetaminophen] Other (See Comments)    Nausea only    Current Outpatient Prescriptions  Medication Sig Dispense Refill  . albuterol (PROVENTIL) (2.5 MG/3ML) 0.083% nebulizer solution Take 2.5 mg by nebulization every 6 (six) hours as needed for wheezing or shortness of breath.    . cephALEXin (KEFLEX) 500 MG capsule Take 1 capsule (500 mg total) by mouth 3 (three) times daily. 42 capsule 0  . clopidogrel (PLAVIX) 75 MG tablet Take 75 mg by mouth daily.    . diclofenac sodium (VOLTAREN) 1 % GEL Apply 1 application topically as needed.    . Gabapentin Enacarbil (HORIZANT) 600 MG TBCR Take 600 mg by mouth 2 (two) times daily.    . hydrOXYzine (VISTARIL) 25 MG capsule Take 25 mg by mouth 2 (two) times daily. Itching    . levETIRAcetam (KEPPRA) 500 MG tablet Take 1 tablet (500 mg total) by mouth 2 (two) times daily. 60 tablet 0  . LYRICA 75 MG capsule TAKE 1 CAPSULE BY  MOUTH THREE TIMES A DAY 90 capsule 0  . oxyCODONE-acetaminophen (PERCOCET) 7.5-500 MG tablet Take 1 tablet by mouth every 4 (four) hours as needed for pain.    . pantoprazole (PROTONIX) 40 MG tablet Take 40 mg by mouth daily.    . ranitidine (ZANTAC) 150 MG tablet Take 1 tablet (150 mg total) by mouth every 12 (twelve) hours as needed for heartburn. 60 tablet 11  . triamcinolone cream (KENALOG) 0.1 % Apply 1 application topically 2 (two) times daily as needed (Leg itch).    Marland Kitchen oxyCODONE-acetaminophen (PERCOCET) 10-325 MG tablet Take 1 tablet by mouth every 6 (six) hours as needed for pain. (Patient not taking: Reported on 01/22/2015) 30 tablet 0  . [DISCONTINUED] topiramate (TOPAMAX) 25 MG tablet Take 25 mg by mouth 2 (two) times daily.       No current facility-administered medications for this visit.     ROS:  See HPI  Physical Exam:  Filed Vitals:   01/22/15 1447  BP: 111/66  Pulse: 93  Temp: 97.2 F (36.2 C)    Incision:  Right leg incision has healed nicely.  Left small saphenous vein harvest is healing with some scabbing.  There is a small open area at the distal incision where the scab has fallen off.  There is not erythema present. Extremities:  Right leg with strong graft pulse as well as palpable distal AT  pulse.  Left leg continues to be tender to touch.   Assessment/Plan:  This is a 55 y.o. male who is s/p: revision of right femoral to AT bypass with interposition small saphenous vein from the left leg November 28, 2014  -right leg bypass is patent with strong palpable pulse within the bypass graft and palpable AT pulse.  -the left leg incision does have some scabs, but the erythema has resolved and the soreness should continue to get better. -no pain medication was dispensed today as the pt does have a pain contract with Dr. Noah Delaine and was given Percocet #90 on 01/17/15. -pt will f/u in 3 months with arterial duplex and ABI's and see NP.   [name],  PA-C [facility] [phone]  Clinic MD:  Pt seen and examined with Dr. Donnetta Hutching  I have examined the patient, reviewed and agree with above.  Curt Jews, MD 01/22/2015 4:30 PM

## 2015-02-06 NOTE — Op Note (Signed)
    OPERATIVE REPORT  DATE OF SURGERY: 02/06/2015  PATIENT: [name], 55 y.o. male MRN: [id number]  DOB: May 27, 1960  PRE-OPERATIVE DIAGNOSIS: Critical ischemia right foot  POST-OPERATIVE DIAGNOSIS:  Same  PROCEDURE: Vision of right femoral anterior tibial bypass with jump graft from old bypass to more distal anterior tibial artery  SURGEON:  Curt Jews, M.D.  PHYSICIAN ASSISTANT: Silva Bandy PA-C  ANESTHESIA:  Gen.  EBL: Minimal ml     BLOOD ADMINISTERED: None  DRAINS: None  SPECIMEN: None  COUNTS CORRECT:  YES  PLAN OF CARE: PACU   PATIENT DISPOSITION:  PACU - hemodynamically stable  PROCEDURE DETAILS: The patient was taken operative placed supine position where the area of the right and left leg prepped and draped in sterile fashion. She had a prior right femoral to anterior tibial bypass with vein which was tunneled laterally to the anterior tibial artery. He developed critical stenosis at the distal anastomosis. The vein graft was exposed the lateral thigh. The prior anterior tibial incision was reopened and extended down past the original anastomosis to native anterior tibial artery distal this. The artery was of adequate size. Next attention was turned the left leg. Small saphenous vein was harvested from the popliteal space the distal calf. Tributary branches were ligated with 3 or 4 silk ties and divided. The vein was ligated distally and was divided before dinner the popliteal vein. The vein was gently dilated was found to be adequate size for bypass. The patient was given 7000 units intravenous heparin. After adequate circulation time the old vein was occluded and was transected. The interposition graft was reversed and spatulated and sewn into into the old vein graft with a running 60 proline suture. Next a pneumatic tourniquet was placed around the distal thigh. Leg was exsanguinated with an Esmarch tourniquet and the tourniquet was inflated to 250 mmHg. Anterior  tibial artery more distal to the old anastomosis was opened with 11 blade some ulcerative with Potts scissors. The new graft was cut to appropriate length and was spatulated and sewn into side to the anterior tibial artery with a running 60 proline suture. A 2 dilator passed easily through the artery proximal and distal prior to completion of the anastomosis. The tourniquet was deflated to allow the usual flushing maneuvers and then the anastomosis was completed. The patient was given 50 mg of protamine to reverse heparin. Wounds are with saline. Hemostasis tablet cautery. Wounds were closed with 3-0 Monocryl in the subcutaneous and subcuticular tissue. Benzoin Steri-Strips were applied. Was transferred to the recovery room stable condition   Curt Jews, M.D. 02/06/2015 4:58 PM

## 2015-02-11 ENCOUNTER — Other Ambulatory Visit: Payer: Self-pay | Admitting: Internal Medicine

## 2015-02-27 ENCOUNTER — Telehealth: Payer: Self-pay

## 2015-02-27 NOTE — Telephone Encounter (Signed)
Phone call from pt.  Reported that his left leg has not healed.  C/o burning in the incision of left lower leg.  Denied fever, but stated he hasn't felt good over past 2 days.  Unable to tell if there has been any redness; stated "I can't really see back there."  C/o one scab falling off, and reported "a little bit of drainage".  Pt. Said "I think it is clear."  Stated there is still some scabs left over the left LE incision.  Reported that he doesn't understand why it is not healing like his right leg did.  Offered appt. 2/16 @ 8:45 AM.  Reported he couldn't come in to office then.  Appt. offered at 10:45 AM 2/17.  Pt. agreed with plan.

## 2015-02-28 ENCOUNTER — Encounter: Payer: Self-pay | Admitting: Family

## 2015-03-01 ENCOUNTER — Ambulatory Visit (INDEPENDENT_AMBULATORY_CARE_PROVIDER_SITE_OTHER): Payer: Medicaid Other | Admitting: Family

## 2015-03-01 ENCOUNTER — Encounter: Payer: Self-pay | Admitting: Family

## 2015-03-01 VITALS — BP 129/77 | HR 97 | Temp 97.5°F | Ht 71.5 in | Wt 176.6 lb

## 2015-03-01 DIAGNOSIS — I779 Disorder of arteries and arterioles, unspecified: Secondary | ICD-10-CM | POA: Diagnosis not present

## 2015-03-01 DIAGNOSIS — Z95828 Presence of other vascular implants and grafts: Secondary | ICD-10-CM | POA: Diagnosis not present

## 2015-03-01 DIAGNOSIS — Z720 Tobacco use: Secondary | ICD-10-CM

## 2015-03-01 DIAGNOSIS — F17200 Nicotine dependence, unspecified, uncomplicated: Secondary | ICD-10-CM

## 2015-03-01 NOTE — Patient Instructions (Signed)
Peripheral Vascular Disease Peripheral vascular disease (PVD) is a disease of the blood vessels that are not part of your heart and brain. A simple term for PVD is poor circulation. In most cases, PVD narrows the blood vessels that carry blood from your heart to the rest of your body. This can result in a decreased supply of blood to your arms, legs, and internal organs, like your stomach or kidneys. However, it most often affects a person's lower legs and feet. There are two types of PVD.  Organic PVD. This is the more common type. It is caused by damage to the structure of blood vessels.  Functional PVD. This is caused by conditions that make blood vessels contract and tighten (spasm). Without treatment, PVD tends to get worse over time. PVD can also lead to acute ischemic limb. This is when an arm or limb suddenly has trouble getting enough blood. This is a medical emergency. CAUSES Each type of PVD has many different causes. The most common cause of PVD is buildup of a fatty material (plaque) inside of your arteries (atherosclerosis). Small amounts of plaque can break off from the walls of the blood vessels and become lodged in a smaller artery. This blocks blood flow and can cause acute ischemic limb. Other common causes of PVD include:  Blood clots that form inside of blood vessels.  Injuries to blood vessels.  Diseases that cause inflammation of blood vessels or cause blood vessel spasms.  Health behaviors and health history that increase your risk of developing PVD. RISK FACTORS  You may have a greater risk of PVD if you:  Have a family history of PVD.  Have certain medical conditions, including:  High cholesterol.  Diabetes.  High blood pressure (hypertension).  Coronary heart disease.  Past problems with blood clots.  Past injury, such as burns or a broken bone. These may have damaged blood vessels in your limbs.  Buerger disease. This is caused by inflamed blood  vessels in your hands and feet.  Some forms of arthritis.  Rare birth defects that affect the arteries in your legs.  Use tobacco.  Do not get enough exercise.  Are obese.  Are age 50 or older. SIGNS AND SYMPTOMS  PVD may cause many different symptoms. Your symptoms depend on what part of your body is not getting enough blood. Some common signs and symptoms include:  Cramps in your lower legs. This may be a symptom of poor leg circulation (claudication).  Pain and weakness in your legs while you are physically active that goes away when you rest (intermittent claudication).  Leg pain when at rest.  Leg numbness, tingling, or weakness.  Coldness in a leg or foot, especially when compared with the other leg.  Skin or hair changes. These can include:  Hair loss.  Shiny skin.  Pale or bluish skin.  Thick toenails.  Inability to get or maintain an erection (erectile dysfunction). People with PVD are more prone to developing ulcers and sores on their toes, feet, or legs. These may take longer than normal to heal. DIAGNOSIS Your health care provider may diagnose PVD from your signs and symptoms. The health care provider will also do a physical exam. You may have tests to find out what is causing your PVD and determine its severity. Tests may include:  Blood pressure recordings from your arms and legs and measurements of the strength of your pulses (pulse volume recordings).  Imaging studies using sound waves to take pictures of   the blood flow through your blood vessels (Doppler ultrasound).  Injecting a dye into your blood vessels before having imaging studies using:  X-rays (angiogram or arteriogram).  Computer-generated X-rays (CT angiogram).  A powerful electromagnetic field and a computer (magnetic resonance angiogram or MRA). TREATMENT Treatment for PVD depends on the cause of your condition and the severity of your symptoms. It also depends on your age. Underlying  causes need to be treated and controlled. These include long-lasting (chronic) conditions, such as diabetes, high cholesterol, and high blood pressure. You may need to first try making lifestyle changes and taking medicines. Surgery may be needed if these do not work. Lifestyle changes may include:  Quitting smoking.  Exercising regularly.  Following a low-fat, low-cholesterol diet. Medicines may include:  Blood thinners to prevent blood clots.  Medicines to improve blood flow.  Medicines to improve your blood cholesterol levels. Surgical procedures may include:  A procedure that uses an inflated balloon to open a blocked artery and improve blood flow (angioplasty).  A procedure to put in a tube (stent) to keep a blocked artery open (stent implant).  Surgery to reroute blood flow around a blocked artery (peripheral bypass surgery).  Surgery to remove dead tissue from an infected wound on the affected limb.  Amputation. This is surgical removal of the affected limb. This may be necessary in cases of acute ischemic limb that are not improved through medical or surgical treatments. HOME CARE INSTRUCTIONS  Take medicines only as directed by your health care provider.  Do not use any tobacco products, including cigarettes, chewing tobacco, or electronic cigarettes. If you need help quitting, ask your health care provider.  Lose weight if you are overweight, and maintain a healthy weight as directed by your health care provider.  Eat a diet that is low in fat and cholesterol. If you need help, ask your health care provider.  Exercise regularly. Ask your health care provider to suggest some good activities for you.  Use compression stockings or other mechanical devices as directed by your health care provider.  Take good care of your feet.  Wear comfortable shoes that fit well.  Check your feet often for any cuts or sores. SEEK MEDICAL CARE IF:  You have cramps in your legs  while walking.  You have leg pain when you are at rest.  You have coldness in a leg or foot.  Your skin changes.  You have erectile dysfunction.  You have cuts or sores on your feet that are not healing. SEEK IMMEDIATE MEDICAL CARE IF:  Your arm or leg turns cold and blue.  Your arms or legs become red, warm, swollen, painful, or numb.  You have chest pain or trouble breathing.  You suddenly have weakness in your face, arm, or leg.  You become very confused or lose the ability to speak.  You suddenly have a very bad headache or lose your vision.   This information is not intended to replace advice given to you by your health care provider. Make sure you discuss any questions you have with your health care provider.   Document Released: 02/06/2004 Document Revised: 01/19/2014 Document Reviewed: 06/08/2013 Elsevier Interactive Patient Education 2016 [employer].    Steps to Quit Smoking  Smoking tobacco can be harmful to your health and can affect almost every organ in your body. Smoking puts you, and those around you, at risk for developing many serious chronic diseases. Quitting smoking is difficult, but it is one of the  best things that you can do for your health. It is never too late to quit. WHAT ARE THE BENEFITS OF QUITTING SMOKING? When you quit smoking, you lower your risk of developing serious diseases and conditions, such as:  Lung cancer or lung disease, such as COPD.  Heart disease.  Stroke.  Heart attack.  Infertility.  Osteoporosis and bone fractures. Additionally, symptoms such as coughing, wheezing, and shortness of breath may get better when you quit. You may also find that you get sick less often because your body is stronger at fighting off colds and infections. If you are pregnant, quitting smoking can help to reduce your chances of having a baby of low birth weight. HOW DO I GET READY TO QUIT? When you decide to quit smoking, create a plan to  make sure that you are successful. Before you quit:  Pick a date to quit. Set a date within the next two weeks to give you time to prepare.  Write down the reasons why you are quitting. Keep this list in places where you will see it often, such as on your bathroom mirror or in your car or wallet.  Identify the people, places, things, and activities that make you want to smoke (triggers) and avoid them. Make sure to take these actions:  Throw away all cigarettes at home, at work, and in your car.  Throw away smoking accessories, such as Scientist, research (medical).  Clean your car and make sure to empty the ashtray.  Clean your home, including curtains and carpets.  Tell your family, friends, and coworkers that you are quitting. Support from your loved ones can make quitting easier.  Talk with your health care provider about your options for quitting smoking.  Find out what treatment options are covered by your health insurance. WHAT STRATEGIES CAN I USE TO QUIT SMOKING?  Talk with your healthcare provider about different strategies to quit smoking. Some strategies include:  Quitting smoking altogether instead of gradually lessening how much you smoke over a period of time. Research shows that quitting "cold [place]" is more successful than gradually quitting.  Attending in-person counseling to help you build problem-solving skills. You are more likely to have success in quitting if you attend several counseling sessions. Even short sessions of 10 minutes can be effective.  Finding resources and support systems that can help you to quit smoking and remain smoke-free after you quit. These resources are most helpful when you use them often. They can include:  Online chats with a Social worker.  Telephone quitlines.  Printed Furniture conservator/restorer.  Support groups or group counseling.  Text messaging programs.  Mobile phone applications.  Taking medicines to help you quit smoking. (If you are  pregnant or breastfeeding, talk with your health care provider first.) Some medicines contain nicotine and some do not. Both types of medicines help with cravings, but the medicines that include nicotine help to relieve withdrawal symptoms. Your health care provider may recommend:  Nicotine patches, gum, or lozenges.  Nicotine inhalers or sprays.  Non-nicotine medicine that is taken by mouth. Talk with your health care provider about combining strategies, such as taking medicines while you are also receiving in-person counseling. Using these two strategies together makes you more likely to succeed in quitting than if you used either strategy on its own. If you are pregnant or breastfeeding, talk with your health care provider about finding counseling or other support strategies to quit smoking. Do not take medicine to help you quit  smoking unless told to do so by your health care provider. WHAT THINGS CAN I DO TO MAKE IT EASIER TO QUIT? Quitting smoking might feel overwhelming at first, but there is a lot that you can do to make it easier. Take these important actions:  Reach out to your family and friends and ask that they support and encourage you during this time. Call telephone quitlines, reach out to support groups, or work with a counselor for support.  Ask people who smoke to avoid smoking around you.  Avoid places that trigger you to smoke, such as bars, parties, or smoke-break areas at work.  Spend time around people who do not smoke.  Lessen stress in your life, because stress can be a smoking trigger for some people. To lessen stress, try:  Exercising regularly.  Deep-breathing exercises.  Yoga.  Meditating.  Performing a body scan. This involves closing your eyes, scanning your body from head to toe, and noticing which parts of your body are particularly tense. Purposefully relax the muscles in those areas.  Download or purchase mobile phone or tablet apps (applications)  that can help you stick to your quit plan by providing reminders, tips, and encouragement. There are many free apps, such as QuitGuide from the [employer] Office manager for Disease Control and Prevention). You can find other support for quitting smoking (smoking cessation) through smokefree.gov and other websites. HOW WILL I FEEL WHEN I QUIT SMOKING? Within the first 24 hours of quitting smoking, you may start to feel some withdrawal symptoms. These symptoms are usually most noticeable 2-3 days after quitting, but they usually do not last beyond 2-3 weeks. Changes or symptoms that you might experience include:  Mood swings.  Restlessness, anxiety, or irritation.  Difficulty concentrating.  Dizziness.  Strong cravings for sugary foods in addition to nicotine.  Mild weight gain.  Constipation.  Nausea.  Coughing or a sore throat.  Changes in how your medicines work in your body.  A depressed mood.  Difficulty sleeping (insomnia). After the first 2-3 weeks of quitting, you may start to notice more positive results, such as:  Improved sense of smell and taste.  Decreased coughing and sore throat.  Slower heart rate.  Lower blood pressure.  Clearer skin.  The ability to breathe more easily.  Fewer sick days. Quitting smoking is very challenging for most people. Do not get discouraged if you are not successful the first time. Some people need to make many attempts to quit before they achieve long-term success. Do your best to stick to your quit plan, and talk with your health care provider if you have any questions or concerns.   This information is not intended to replace advice given to you by your health care provider. Make sure you discuss any questions you have with your health care provider.   Document Released: 12/23/2000 Document Revised: 05/15/2014 Document Reviewed: 05/15/2014 Elsevier Interactive Patient Education 2016 [employer].    Steps to Quit Smoking  Smoking  tobacco can be harmful to your health and can affect almost every organ in your body. Smoking puts you, and those around you, at risk for developing many serious chronic diseases. Quitting smoking is difficult, but it is one of the best things that you can do for your health. It is never too late to quit. WHAT ARE THE BENEFITS OF QUITTING SMOKING? When you quit smoking, you lower your risk of developing serious diseases and conditions, such as:  Lung cancer or lung disease, such as  COPD.  Heart disease.  Stroke.  Heart attack.  Infertility.  Osteoporosis and bone fractures. Additionally, symptoms such as coughing, wheezing, and shortness of breath may get better when you quit. You may also find that you get sick less often because your body is stronger at fighting off colds and infections. If you are pregnant, quitting smoking can help to reduce your chances of having a baby of low birth weight. HOW DO I GET READY TO QUIT? When you decide to quit smoking, create a plan to make sure that you are successful. Before you quit:  Pick a date to quit. Set a date within the next two weeks to give you time to prepare.  Write down the reasons why you are quitting. Keep this list in places where you will see it often, such as on your bathroom mirror or in your car or wallet.  Identify the people, places, things, and activities that make you want to smoke (triggers) and avoid them. Make sure to take these actions:  Throw away all cigarettes at home, at work, and in your car.  Throw away smoking accessories, such as Scientist, research (medical).  Clean your car and make sure to empty the ashtray.  Clean your home, including curtains and carpets.  Tell your family, friends, and coworkers that you are quitting. Support from your loved ones can make quitting easier.  Talk with your health care provider about your options for quitting smoking.  Find out what treatment options are covered by your health  insurance. WHAT STRATEGIES CAN I USE TO QUIT SMOKING?  Talk with your healthcare provider about different strategies to quit smoking. Some strategies include:  Quitting smoking altogether instead of gradually lessening how much you smoke over a period of time. Research shows that quitting "cold [place]" is more successful than gradually quitting.  Attending in-person counseling to help you build problem-solving skills. You are more likely to have success in quitting if you attend several counseling sessions. Even short sessions of 10 minutes can be effective.  Finding resources and support systems that can help you to quit smoking and remain smoke-free after you quit. These resources are most helpful when you use them often. They can include:  Online chats with a Social worker.  Telephone quitlines.  Printed Furniture conservator/restorer.  Support groups or group counseling.  Text messaging programs.  Mobile phone applications.  Taking medicines to help you quit smoking. (If you are pregnant or breastfeeding, talk with your health care provider first.) Some medicines contain nicotine and some do not. Both types of medicines help with cravings, but the medicines that include nicotine help to relieve withdrawal symptoms. Your health care provider may recommend:  Nicotine patches, gum, or lozenges.  Nicotine inhalers or sprays.  Non-nicotine medicine that is taken by mouth. Talk with your health care provider about combining strategies, such as taking medicines while you are also receiving in-person counseling. Using these two strategies together makes you more likely to succeed in quitting than if you used either strategy on its own. If you are pregnant or breastfeeding, talk with your health care provider about finding counseling or other support strategies to quit smoking. Do not take medicine to help you quit smoking unless told to do so by your health care provider. WHAT THINGS CAN I DO TO MAKE IT  EASIER TO QUIT? Quitting smoking might feel overwhelming at first, but there is a lot that you can do to make it easier. Take these important actions:  Reach out to your family and friends and ask that they support and encourage you during this time. Call telephone quitlines, reach out to support groups, or work with a counselor for support.  Ask people who smoke to avoid smoking around you.  Avoid places that trigger you to smoke, such as bars, parties, or smoke-break areas at work.  Spend time around people who do not smoke.  Lessen stress in your life, because stress can be a smoking trigger for some people. To lessen stress, try:  Exercising regularly.  Deep-breathing exercises.  Yoga.  Meditating.  Performing a body scan. This involves closing your eyes, scanning your body from head to toe, and noticing which parts of your body are particularly tense. Purposefully relax the muscles in those areas.  Download or purchase mobile phone or tablet apps (applications) that can help you stick to your quit plan by providing reminders, tips, and encouragement. There are many free apps, such as QuitGuide from the [employer] Office manager for Disease Control and Prevention). You can find other support for quitting smoking (smoking cessation) through smokefree.gov and other websites. HOW WILL I FEEL WHEN I QUIT SMOKING? Within the first 24 hours of quitting smoking, you may start to feel some withdrawal symptoms. These symptoms are usually most noticeable 2-3 days after quitting, but they usually do not last beyond 2-3 weeks. Changes or symptoms that you might experience include:  Mood swings.  Restlessness, anxiety, or irritation.  Difficulty concentrating.  Dizziness.  Strong cravings for sugary foods in addition to nicotine.  Mild weight gain.  Constipation.  Nausea.  Coughing or a sore throat.  Changes in how your medicines work in your body.  A depressed mood.  Difficulty sleeping  (insomnia). After the first 2-3 weeks of quitting, you may start to notice more positive results, such as:  Improved sense of smell and taste.  Decreased coughing and sore throat.  Slower heart rate.  Lower blood pressure.  Clearer skin.  The ability to breathe more easily.  Fewer sick days. Quitting smoking is very challenging for most people. Do not get discouraged if you are not successful the first time. Some people need to make many attempts to quit before they achieve long-term success. Do your best to stick to your quit plan, and talk with your health care provider if you have any questions or concerns.   This information is not intended to replace advice given to you by your health care provider. Make sure you discuss any questions you have with your health care provider.   Document Released: 12/23/2000 Document Revised: 05/15/2014 Document Reviewed: 05/15/2014 Elsevier Interactive Patient Education [employer].
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
## 2015-03-14 ENCOUNTER — Other Ambulatory Visit: Payer: Self-pay | Admitting: Internal Medicine

## 2015-04-12 ENCOUNTER — Other Ambulatory Visit: Payer: Self-pay | Admitting: Internal Medicine

## 2015-04-15 ENCOUNTER — Other Ambulatory Visit: Payer: Self-pay | Admitting: *Deleted

## 2015-04-15 DIAGNOSIS — Z48812 Encounter for surgical aftercare following surgery on the circulatory system: Secondary | ICD-10-CM

## 2015-04-15 DIAGNOSIS — I739 Peripheral vascular disease, unspecified: Secondary | ICD-10-CM

## 2015-04-19 ENCOUNTER — Encounter: Payer: Self-pay | Admitting: Family

## 2015-04-23 ENCOUNTER — Encounter (HOSPITAL_COMMUNITY): Payer: Medicaid Other

## 2015-04-23 ENCOUNTER — Ambulatory Visit (HOSPITAL_COMMUNITY)
Admission: RE | Admit: 2015-04-23 | Discharge: 2015-04-23 | Disposition: A | Discharge status: Home / Self Care | Payer: Medicaid Other | Source: Ambulatory Visit | Attending: Vascular Surgery | Admitting: Vascular Surgery

## 2015-04-23 ENCOUNTER — Ambulatory Visit (INDEPENDENT_AMBULATORY_CARE_PROVIDER_SITE_OTHER)
Admission: RE | Admit: 2015-04-23 | Discharge: 2015-04-23 | Disposition: A | Discharge status: Home / Self Care | Payer: Medicaid Other | Source: Ambulatory Visit | Attending: Vascular Surgery | Admitting: Vascular Surgery

## 2015-04-23 DIAGNOSIS — I739 Peripheral vascular disease, unspecified: Secondary | ICD-10-CM

## 2015-04-23 DIAGNOSIS — I110 Hypertensive heart disease with heart failure: Secondary | ICD-10-CM | POA: Diagnosis not present

## 2015-04-23 DIAGNOSIS — I509 Heart failure, unspecified: Secondary | ICD-10-CM | POA: Diagnosis not present

## 2015-04-23 DIAGNOSIS — K219 Gastro-esophageal reflux disease without esophagitis: Secondary | ICD-10-CM | POA: Insufficient documentation

## 2015-04-23 DIAGNOSIS — E119 Type 2 diabetes mellitus without complications: Secondary | ICD-10-CM | POA: Diagnosis not present

## 2015-04-23 DIAGNOSIS — Z48812 Encounter for surgical aftercare following surgery on the circulatory system: Secondary | ICD-10-CM | POA: Diagnosis not present

## 2015-04-23 DIAGNOSIS — E785 Hyperlipidemia, unspecified: Secondary | ICD-10-CM | POA: Diagnosis not present

## 2015-04-23 DIAGNOSIS — R938 Abnormal findings on diagnostic imaging of other specified body structures: Secondary | ICD-10-CM | POA: Diagnosis not present

## 2015-04-23 DIAGNOSIS — R0989 Other specified symptoms and signs involving the circulatory and respiratory systems: Secondary | ICD-10-CM | POA: Diagnosis present

## 2015-04-30 ENCOUNTER — Encounter: Payer: Self-pay | Admitting: Family

## 2015-04-30 ENCOUNTER — Ambulatory Visit (INDEPENDENT_AMBULATORY_CARE_PROVIDER_SITE_OTHER): Payer: Medicaid Other | Admitting: Family

## 2015-04-30 VITALS — BP 128/77 | HR 84 | Temp 97.6°F | Ht 71.5 in | Wt 177.3 lb

## 2015-04-30 DIAGNOSIS — F17200 Nicotine dependence, unspecified, uncomplicated: Secondary | ICD-10-CM

## 2015-04-30 DIAGNOSIS — Z95828 Presence of other vascular implants and grafts: Secondary | ICD-10-CM

## 2015-04-30 DIAGNOSIS — I779 Disorder of arteries and arterioles, unspecified: Secondary | ICD-10-CM | POA: Diagnosis not present

## 2015-04-30 DIAGNOSIS — Z720 Tobacco use: Secondary | ICD-10-CM | POA: Diagnosis not present
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
## 2015-04-30 NOTE — Patient Instructions (Signed)
Peripheral Vascular Disease Peripheral vascular disease (PVD) is a disease of the blood vessels that are not part of your heart and brain. A simple term for PVD is poor circulation. In most cases, PVD narrows the blood vessels that carry blood from your heart to the rest of your body. This can result in a decreased supply of blood to your arms, legs, and internal organs, like your stomach or kidneys. However, it most often affects a person's lower legs and feet. There are two types of PVD.  Organic PVD. This is the more common type. It is caused by damage to the structure of blood vessels.  Functional PVD. This is caused by conditions that make blood vessels contract and tighten (spasm). Without treatment, PVD tends to get worse over time. PVD can also lead to acute ischemic limb. This is when an arm or limb suddenly has trouble getting enough blood. This is a medical emergency. CAUSES Each type of PVD has many different causes. The most common cause of PVD is buildup of a fatty material (plaque) inside of your arteries (atherosclerosis). Small amounts of plaque can break off from the walls of the blood vessels and become lodged in a smaller artery. This blocks blood flow and can cause acute ischemic limb. Other common causes of PVD include:  Blood clots that form inside of blood vessels.  Injuries to blood vessels.  Diseases that cause inflammation of blood vessels or cause blood vessel spasms.  Health behaviors and health history that increase your risk of developing PVD. RISK FACTORS  You may have a greater risk of PVD if you:  Have a family history of PVD.  Have certain medical conditions, including:  High cholesterol.  Diabetes.  High blood pressure (hypertension).  Coronary heart disease.  Past problems with blood clots.  Past injury, such as burns or a broken bone. These may have damaged blood vessels in your limbs.  Buerger disease. This is caused by inflamed blood  vessels in your hands and feet.  Some forms of arthritis.  Rare birth defects that affect the arteries in your legs.  Use tobacco.  Do not get enough exercise.  Are obese.  Are age 50 or older. SIGNS AND SYMPTOMS  PVD may cause many different symptoms. Your symptoms depend on what part of your body is not getting enough blood. Some common signs and symptoms include:  Cramps in your lower legs. This may be a symptom of poor leg circulation (claudication).  Pain and weakness in your legs while you are physically active that goes away when you rest (intermittent claudication).  Leg pain when at rest.  Leg numbness, tingling, or weakness.  Coldness in a leg or foot, especially when compared with the other leg.  Skin or hair changes. These can include:  Hair loss.  Shiny skin.  Pale or bluish skin.  Thick toenails.  Inability to get or maintain an erection (erectile dysfunction). People with PVD are more prone to developing ulcers and sores on their toes, feet, or legs. These may take longer than normal to heal. DIAGNOSIS Your health care provider may diagnose PVD from your signs and symptoms. The health care provider will also do a physical exam. You may have tests to find out what is causing your PVD and determine its severity. Tests may include:  Blood pressure recordings from your arms and legs and measurements of the strength of your pulses (pulse volume recordings).  Imaging studies using sound waves to take pictures of   the blood flow through your blood vessels (Doppler ultrasound).  Injecting a dye into your blood vessels before having imaging studies using:  X-rays (angiogram or arteriogram).  Computer-generated X-rays (CT angiogram).  A powerful electromagnetic field and a computer (magnetic resonance angiogram or MRA). TREATMENT Treatment for PVD depends on the cause of your condition and the severity of your symptoms. It also depends on your age. Underlying  causes need to be treated and controlled. These include long-lasting (chronic) conditions, such as diabetes, high cholesterol, and high blood pressure. You may need to first try making lifestyle changes and taking medicines. Surgery may be needed if these do not work. Lifestyle changes may include:  Quitting smoking.  Exercising regularly.  Following a low-fat, low-cholesterol diet. Medicines may include:  Blood thinners to prevent blood clots.  Medicines to improve blood flow.  Medicines to improve your blood cholesterol levels. Surgical procedures may include:  A procedure that uses an inflated balloon to open a blocked artery and improve blood flow (angioplasty).  A procedure to put in a tube (stent) to keep a blocked artery open (stent implant).  Surgery to reroute blood flow around a blocked artery (peripheral bypass surgery).  Surgery to remove dead tissue from an infected wound on the affected limb.  Amputation. This is surgical removal of the affected limb. This may be necessary in cases of acute ischemic limb that are not improved through medical or surgical treatments. HOME CARE INSTRUCTIONS  Take medicines only as directed by your health care provider.  Do not use any tobacco products, including cigarettes, chewing tobacco, or electronic cigarettes. If you need help quitting, ask your health care provider.  Lose weight if you are overweight, and maintain a healthy weight as directed by your health care provider.  Eat a diet that is low in fat and cholesterol. If you need help, ask your health care provider.  Exercise regularly. Ask your health care provider to suggest some good activities for you.  Use compression stockings or other mechanical devices as directed by your health care provider.  Take good care of your feet.  Wear comfortable shoes that fit well.  Check your feet often for any cuts or sores. SEEK MEDICAL CARE IF:  You have cramps in your legs  while walking.  You have leg pain when you are at rest.  You have coldness in a leg or foot.  Your skin changes.  You have erectile dysfunction.  You have cuts or sores on your feet that are not healing. SEEK IMMEDIATE MEDICAL CARE IF:  Your arm or leg turns cold and blue.  Your arms or legs become red, warm, swollen, painful, or numb.  You have chest pain or trouble breathing.  You suddenly have weakness in your face, arm, or leg.  You become very confused or lose the ability to speak.  You suddenly have a very bad headache or lose your vision.   This information is not intended to replace advice given to you by your health care provider. Make sure you discuss any questions you have with your health care provider.   Document Released: 02/06/2004 Document Revised: 01/19/2014 Document Reviewed: 06/08/2013 Elsevier Interactive Patient Education 2016 [employer]     Steps to Quit Smoking  Smoking tobacco can be harmful to your health and can affect almost every organ in your body. Smoking puts you, and those around you, at risk for developing many serious chronic diseases. Quitting smoking is difficult, but it is one of   the best things that you can do for your health. It is never too late to quit. WHAT ARE THE BENEFITS OF QUITTING SMOKING? When you quit smoking, you lower your risk of developing serious diseases and conditions, such as:  Lung cancer or lung disease, such as COPD.  Heart disease.  Stroke.  Heart attack.  Infertility.  Osteoporosis and bone fractures. Additionally, symptoms such as coughing, wheezing, and shortness of breath may get better when you quit. You may also find that you get sick less often because your body is stronger at fighting off colds and infections. If you are pregnant, quitting smoking can help to reduce your chances of having a baby of low birth weight. HOW DO I GET READY TO QUIT? When you decide to quit smoking, create a plan to  make sure that you are successful. Before you quit:  Pick a date to quit. Set a date within the next two weeks to give you time to prepare.  Write down the reasons why you are quitting. Keep this list in places where you will see it often, such as on your bathroom mirror or in your car or wallet.  Identify the people, places, things, and activities that make you want to smoke (triggers) and avoid them. Make sure to take these actions:  Throw away all cigarettes at home, at work, and in your car.  Throw away smoking accessories, such as ashtrays and lighters.  Clean your car and make sure to empty the ashtray.  Clean your home, including curtains and carpets.  Tell your family, friends, and coworkers that you are quitting. Support from your loved ones can make quitting easier.  Talk with your health care provider about your options for quitting smoking.  Find out what treatment options are covered by your health insurance. WHAT STRATEGIES CAN I USE TO QUIT SMOKING?  Talk with your healthcare provider about different strategies to quit smoking. Some strategies include:  Quitting smoking altogether instead of gradually lessening how much you smoke over a period of time. Research shows that quitting "cold turkey" is more successful than gradually quitting.  Attending in-person counseling to help you build problem-solving skills. You are more likely to have success in quitting if you attend several counseling sessions. Even short sessions of 10 minutes can be effective.  Finding resources and support systems that can help you to quit smoking and remain smoke-free after you quit. These resources are most helpful when you use them often. They can include:  Online chats with a counselor.  Telephone quitlines.  Printed self-help materials.  Support groups or group counseling.  Text messaging programs.  Mobile phone applications.  Taking medicines to help you quit smoking. (If you are  pregnant or breastfeeding, talk with your health care provider first.) Some medicines contain nicotine and some do not. Both types of medicines help with cravings, but the medicines that include nicotine help to relieve withdrawal symptoms. Your health care provider may recommend:  Nicotine patches, gum, or lozenges.  Nicotine inhalers or sprays.  Non-nicotine medicine that is taken by mouth. Talk with your health care provider about combining strategies, such as taking medicines while you are also receiving in-person counseling. Using these two strategies together makes you more likely to succeed in quitting than if you used either strategy on its own. If you are pregnant or breastfeeding, talk with your health care provider about finding counseling or other support strategies to quit smoking. Do not take medicine to help you   quit smoking unless told to do so by your health care provider. WHAT THINGS CAN I DO TO MAKE IT EASIER TO QUIT? Quitting smoking might feel overwhelming at first, but there is a lot that you can do to make it easier. Take these important actions:  Reach out to your family and friends and ask that they support and encourage you during this time. Call telephone quitlines, reach out to support groups, or work with a counselor for support.  Ask people who smoke to avoid smoking around you.  Avoid places that trigger you to smoke, such as bars, parties, or smoke-break areas at work.  Spend time around people who do not smoke.  Lessen stress in your life, because stress can be a smoking trigger for some people. To lessen stress, try:  Exercising regularly.  Deep-breathing exercises.  Yoga.  Meditating.  Performing a body scan. This involves closing your eyes, scanning your body from head to toe, and noticing which parts of your body are particularly tense. Purposefully relax the muscles in those areas.  Download or purchase mobile phone or tablet apps (applications)  that can help you stick to your quit plan by providing reminders, tips, and encouragement. There are many free apps, such as QuitGuide from the CDC (Centers for Disease Control and Prevention). You can find other support for quitting smoking (smoking cessation) through smokefree.gov and other websites. HOW WILL I FEEL WHEN I QUIT SMOKING? Within the first 24 hours of quitting smoking, you may start to feel some withdrawal symptoms. These symptoms are usually most noticeable 2-3 days after quitting, but they usually do not last beyond 2-3 weeks. Changes or symptoms that you might experience include:  Mood swings.  Restlessness, anxiety, or irritation.  Difficulty concentrating.  Dizziness.  Strong cravings for sugary foods in addition to nicotine.  Mild weight gain.  Constipation.  Nausea.  Coughing or a sore throat.  Changes in how your medicines work in your body.  A depressed mood.  Difficulty sleeping (insomnia). After the first 2-3 weeks of quitting, you may start to notice more positive results, such as:  Improved sense of smell and taste.  Decreased coughing and sore throat.  Slower heart rate.  Lower blood pressure.  Clearer skin.  The ability to breathe more easily.  Fewer sick days. Quitting smoking is very challenging for most people. Do not get discouraged if you are not successful the first time. Some people need to make many attempts to quit before they achieve long-term success. Do your best to stick to your quit plan, and talk with your health care provider if you have any questions or concerns.   This information is not intended to replace advice given to you by your health care provider. Make sure you discuss any questions you have with your health care provider.   Document Released: 12/23/2000 Document Revised: 05/15/2014 Document Reviewed: 05/15/2014 Elsevier Interactive Patient Education 2016 [employer] Inc.  

## 2015-05-09 ENCOUNTER — Other Ambulatory Visit: Payer: Self-pay | Admitting: Internal Medicine

## 2015-06-07 NOTE — Addendum Note (Signed)
Addended by: Mena Goes on: 06/07/2015 02:07 PM   Modules accepted: Orders

## 2015-08-19 ENCOUNTER — Other Ambulatory Visit: Payer: Self-pay

## 2015-08-19 DIAGNOSIS — I739 Peripheral vascular disease, unspecified: Secondary | ICD-10-CM

## 2015-08-19 MED ORDER — CLOPIDOGREL BISULFATE 75 MG PO TABS: 75.0000 mg | ORAL_TABLET | Freq: Every day | ORAL | 2 refills | Status: DC

## 2015-08-22 ENCOUNTER — Telehealth: Payer: Self-pay | Admitting: Vascular Surgery

## 2015-08-22 NOTE — Telephone Encounter (Signed)
MEDICAID DENIED ULTRASOUNDS FOR DOS 09/03/15 BECAUSE PATIENT HAS NOT BEEN CONTACTED WITHIN THE LAST 60 DAYS.  I CALLED THE PATIENT AND HE HAS AGREED TO KEEP THE APPOINTMENT AS HE NEEDS TO BE SEEN.  I WILL LET MEDICAID KNOW THIS AND TRY AGAIN FOR PRE-CERT.

## 2015-09-03 ENCOUNTER — Encounter (HOSPITAL_COMMUNITY): Payer: Medicaid Other

## 2015-09-05 IMAGING — CR DG CHEST 2V
2 series · 2 of 2 positions shown · non-contrast
Comparison: PA and lateral chest 01/26/2012.

CLINICAL DATA: Preoperative films.

EXAM:
CHEST  2 VIEW

[w chest pa]
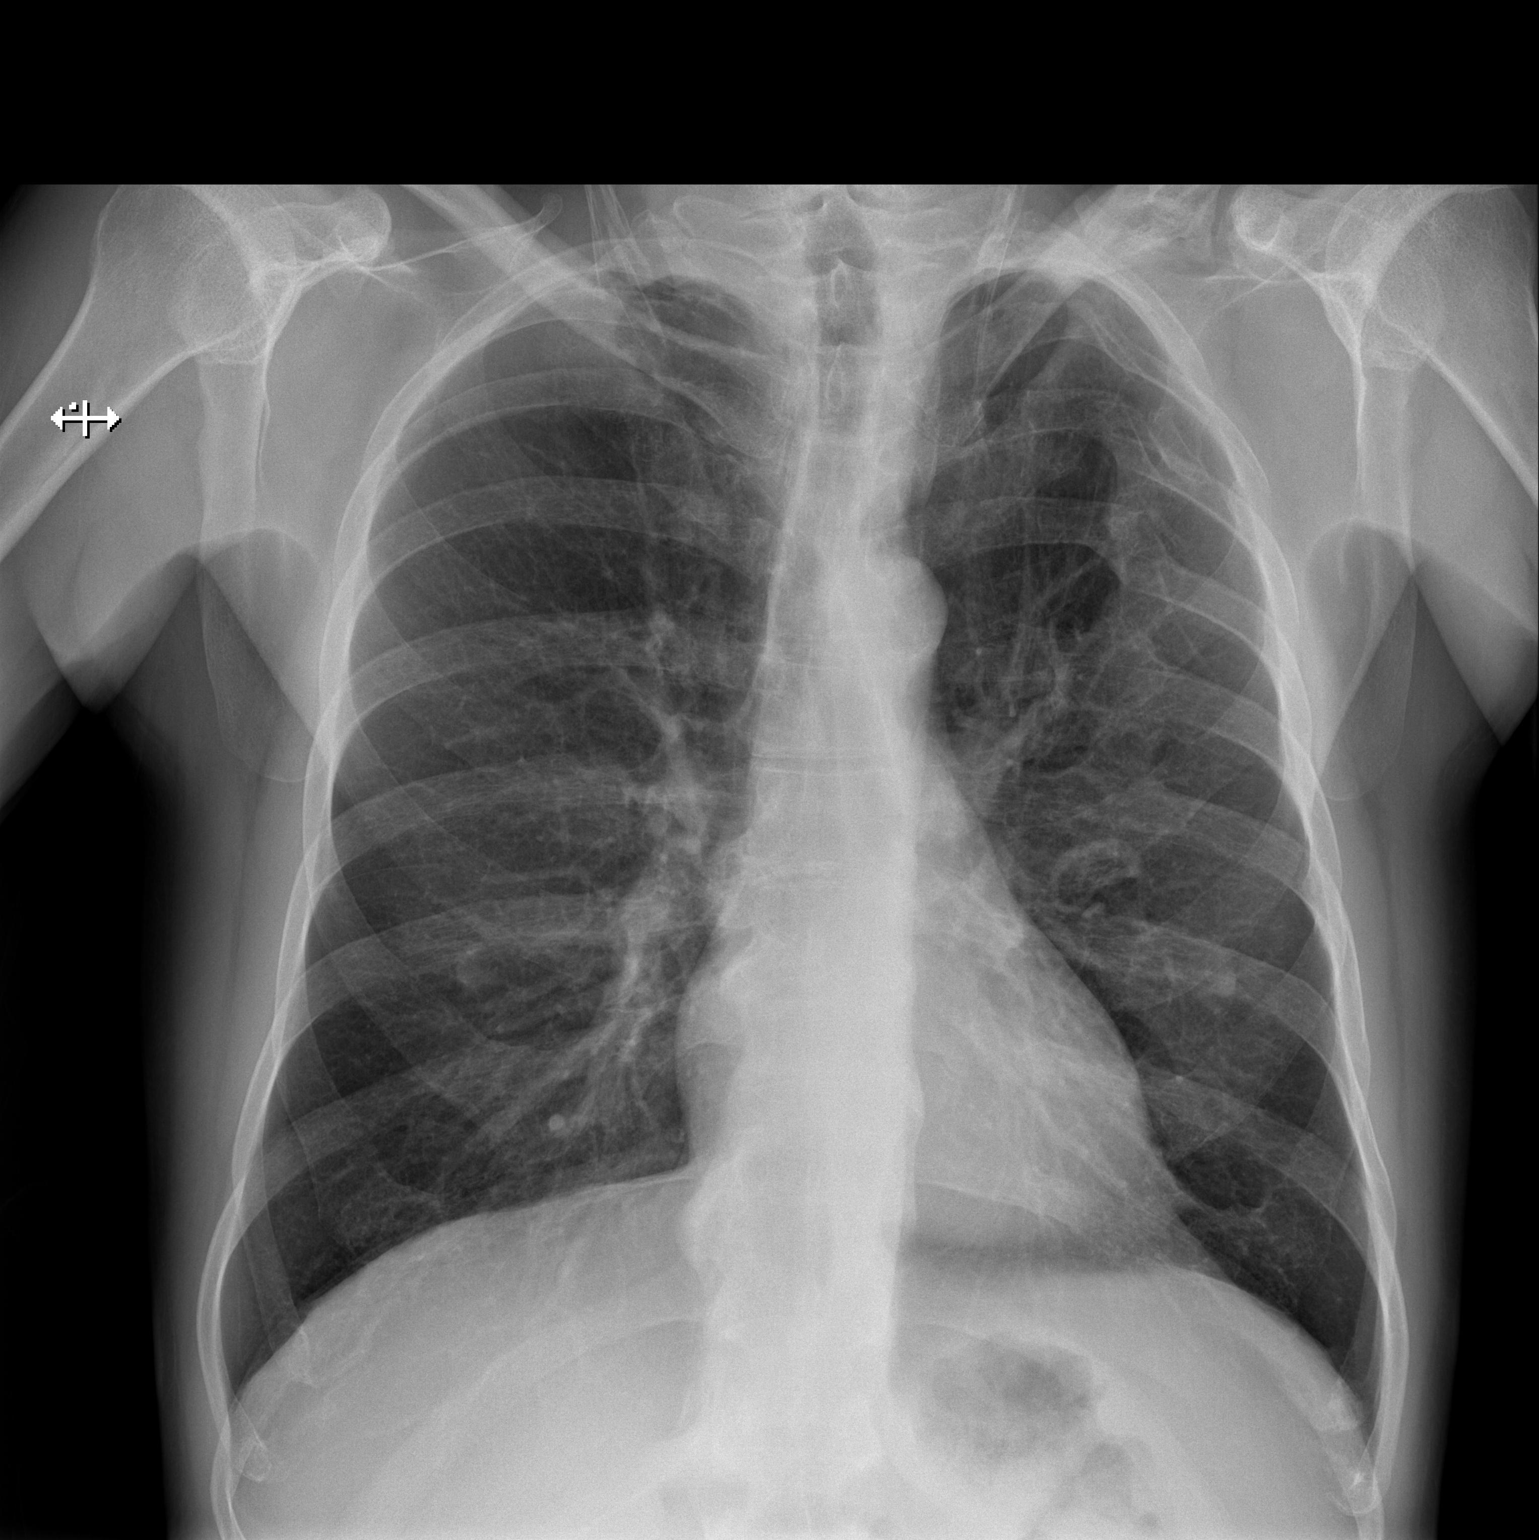

[w chest lat]
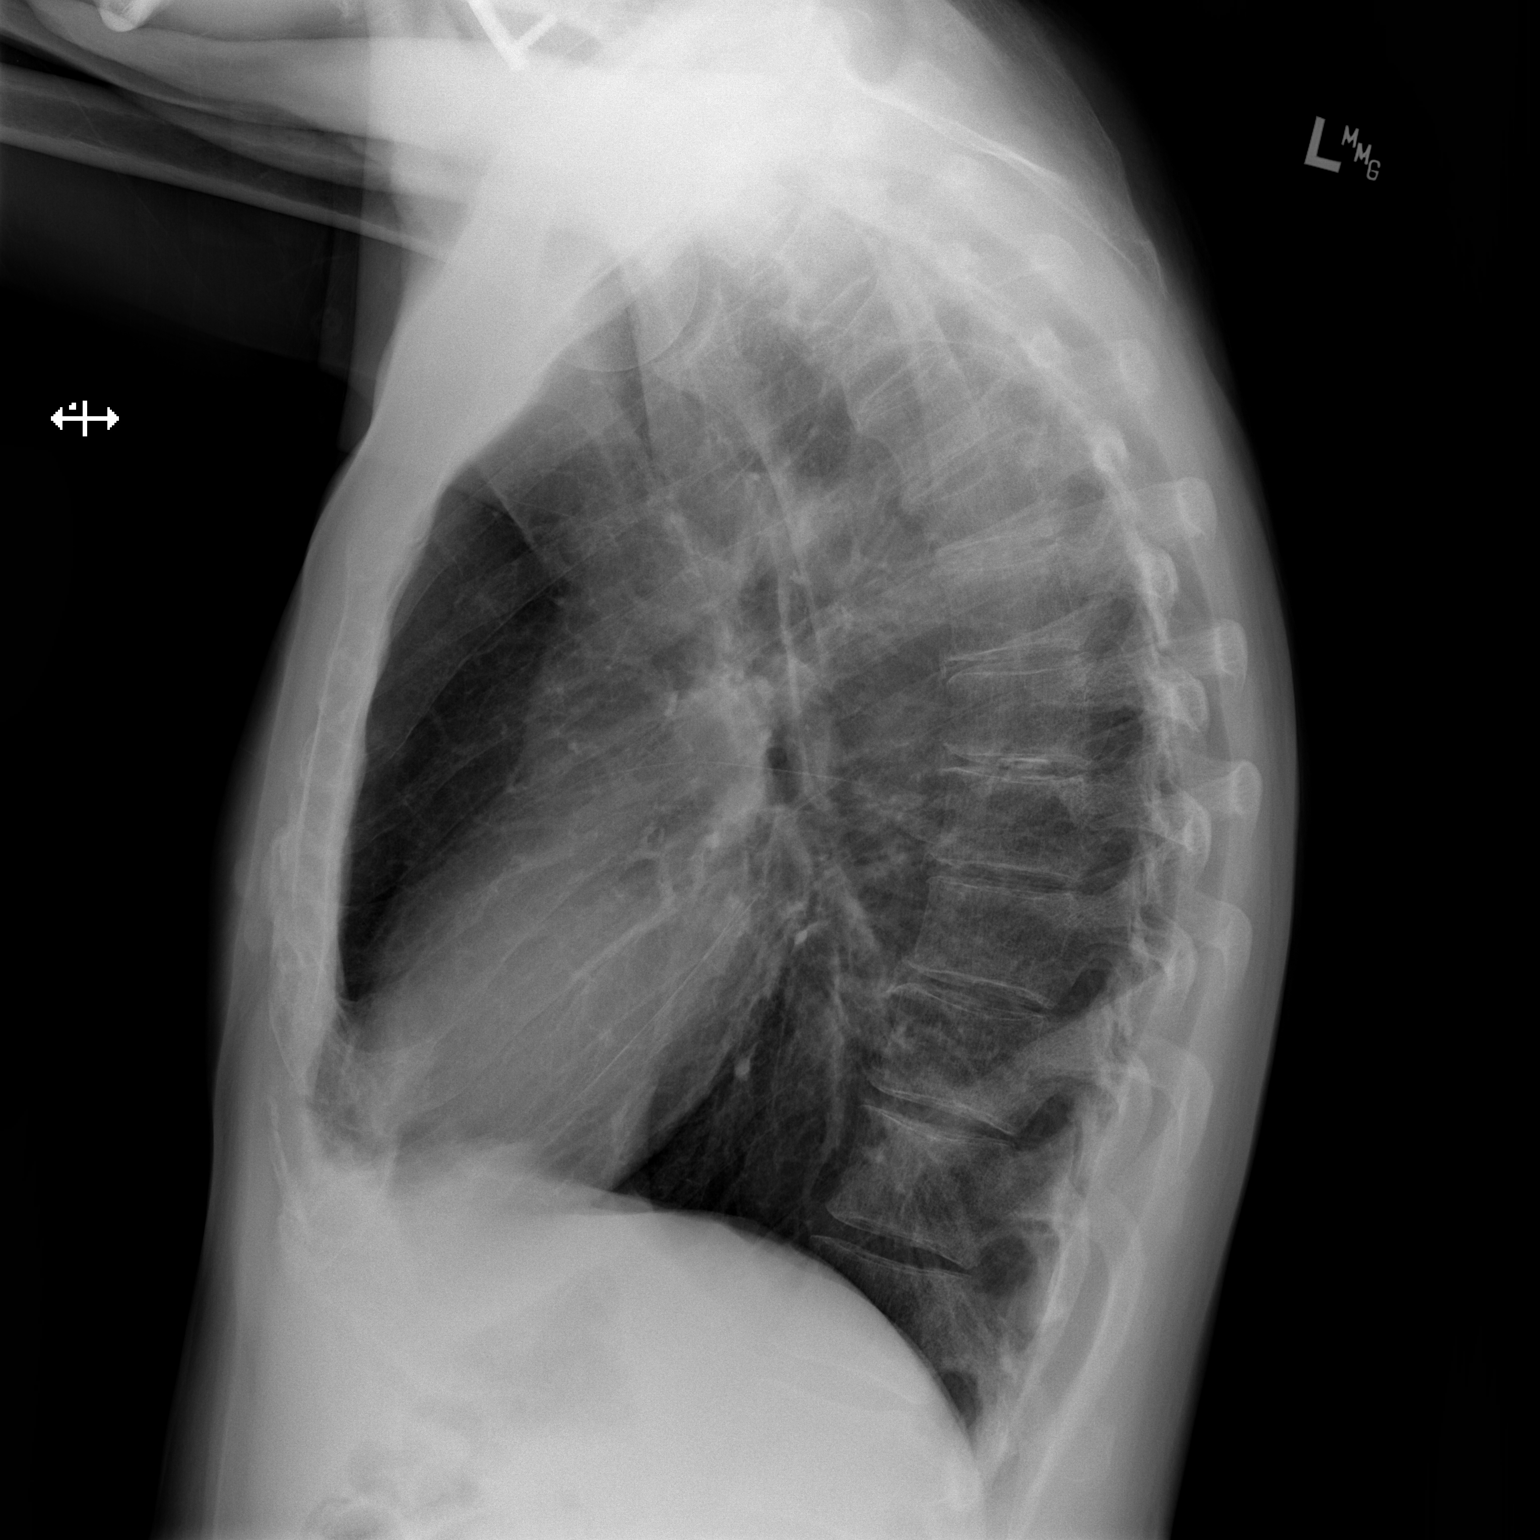

[2 of 2 positions shown; findings below may reference images not displayed]

FINDINGS: The lungs are clear. Heart size is normal. No pneumothorax or
pleural effusion. Remote left clavicle and upper rib fractures are
again seen.
IMPRESSION: No acute abnormality.  Stable compared to prior exam.

## 2015-09-10 IMAGING — XA IR ANGIO VERTEBRAL SEL SUBCLAVIAN INNOMINATE BILAT MOD SED
2 of 3 series · 12 of 24 positions shown · IV contrast (IODINE)
Comparison: none

CLINICAL DATA: Persistent VBI symptoms increasing in frequency
especially on looking up and hyperextending neck.

[Series 6: carotid 1 · 1 of 2 slices shown]
[im 1/2]
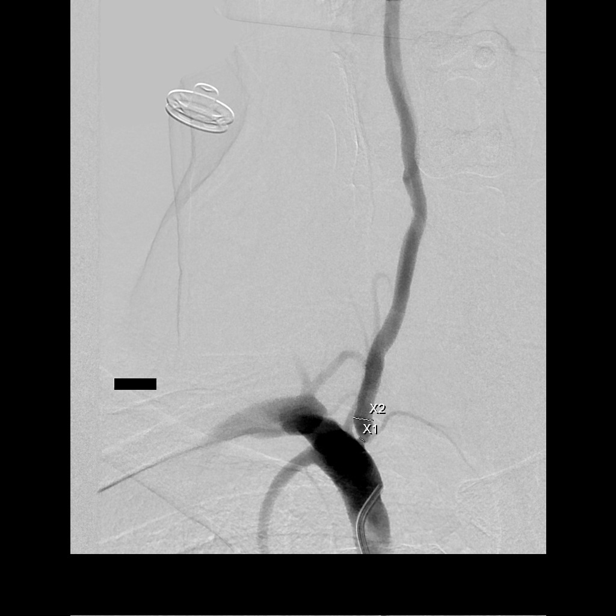

[Series 300: ir transcath excran vert or car a stent · 11 of 149 slices shown]
[im 8/149]
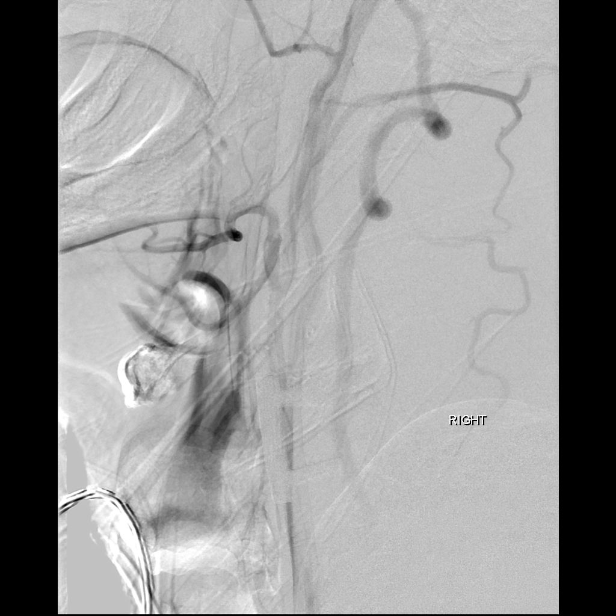
[im 22/149]
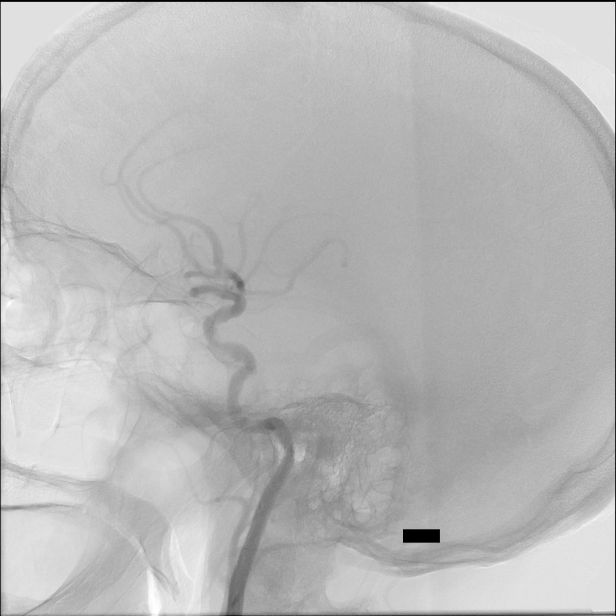
[im 36/149]
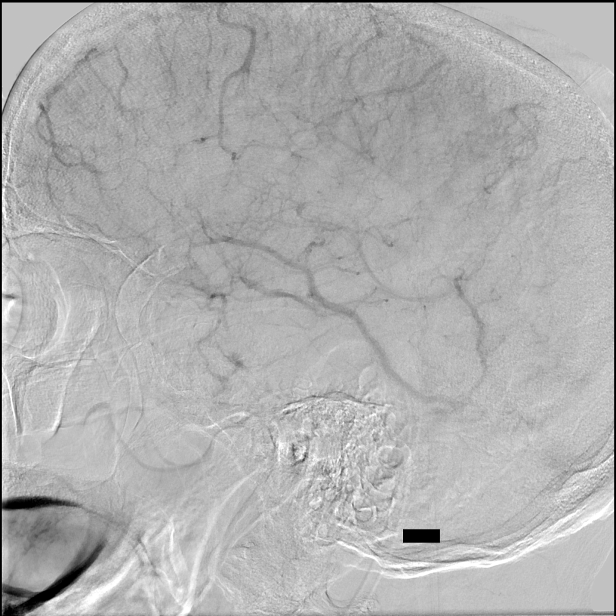
[im 50/149]
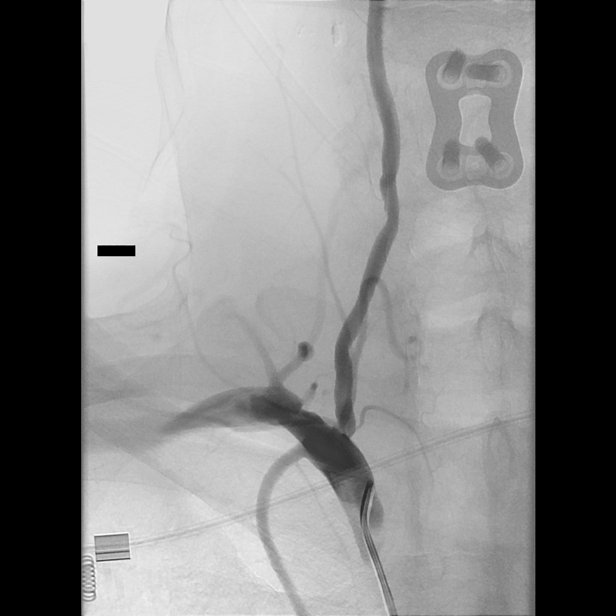
[im 64/149]
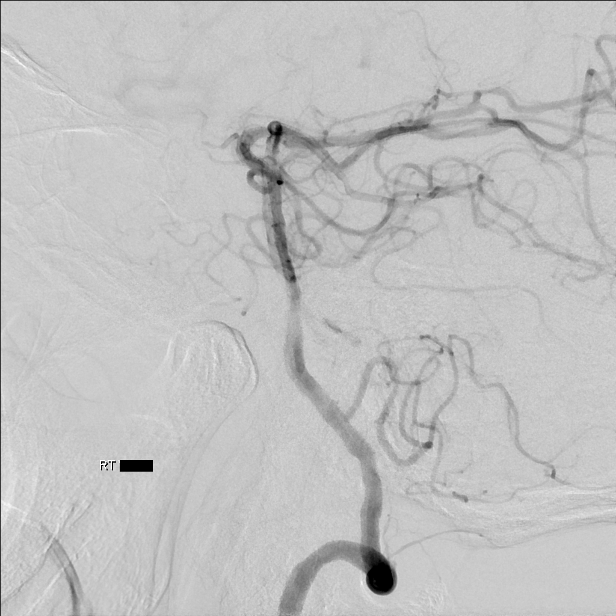
[im 78/149]
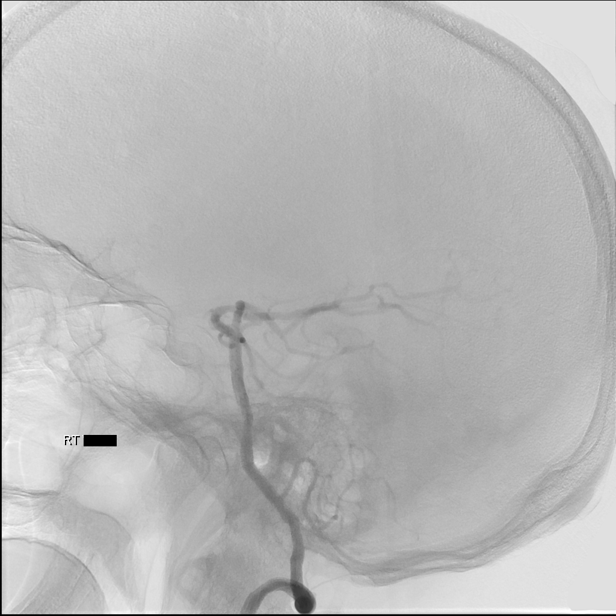
[im 92/149]
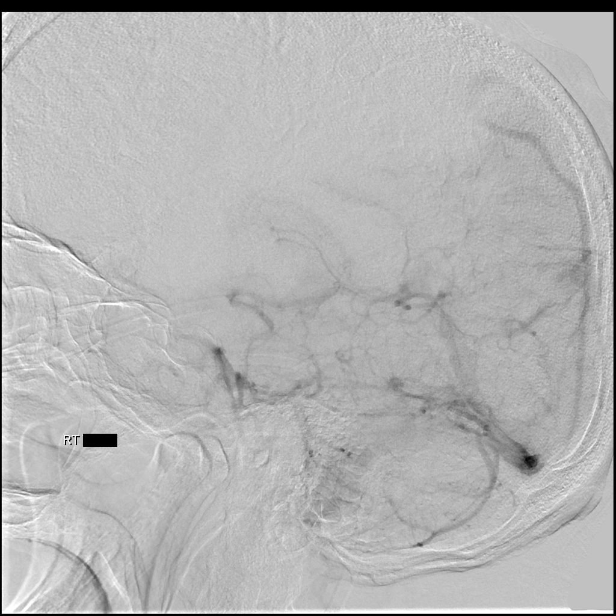
[im 106/149]
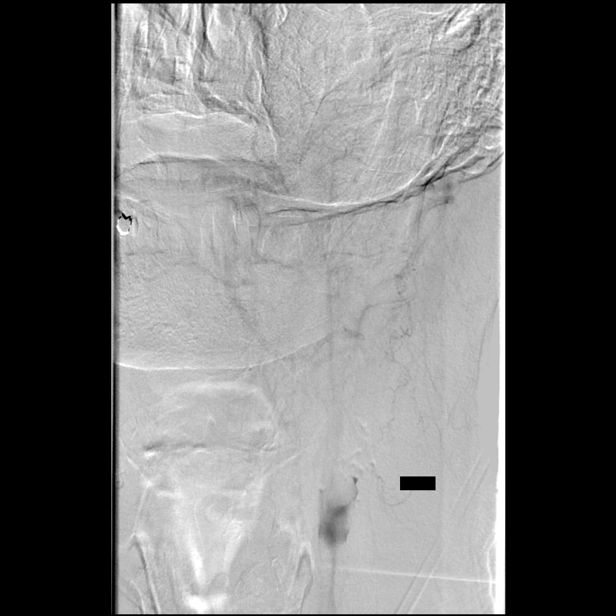
[im 120/149]
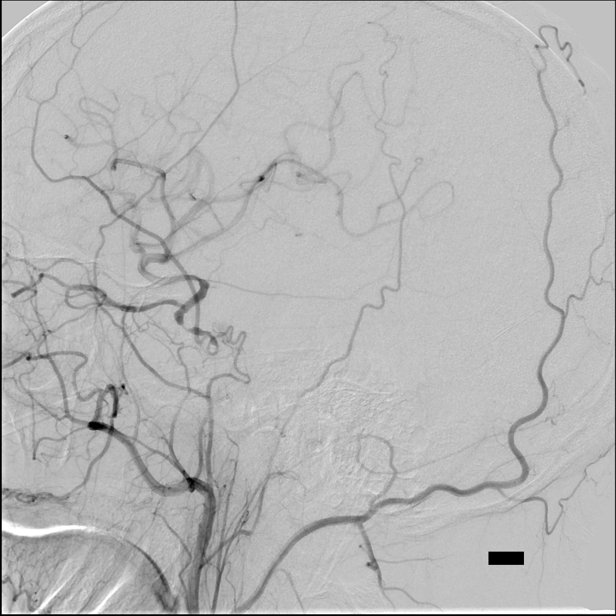
[im 134/149]
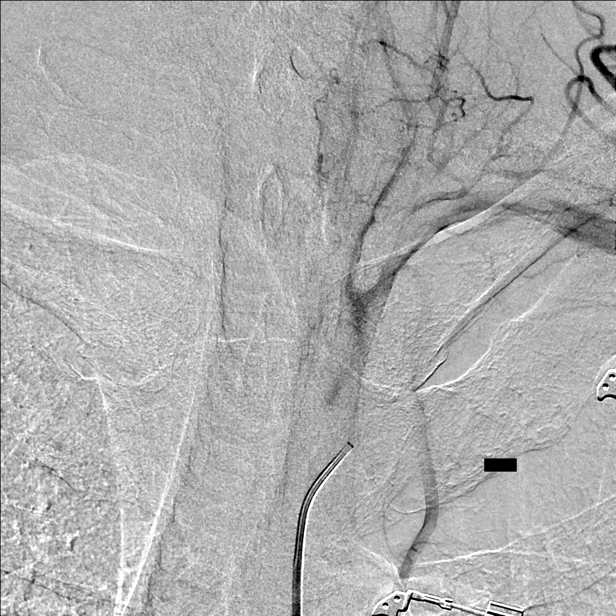
[im 149/149]
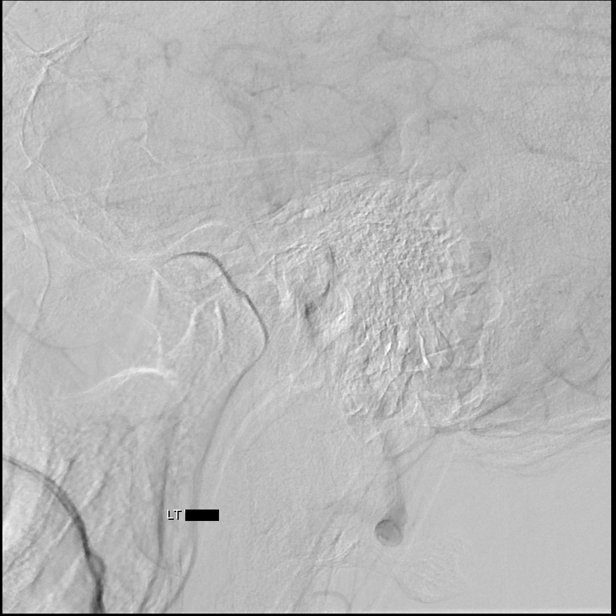

[12 of 24 positions shown; findings below may reference images not displayed]

EXAM:
BILATERAL COMMON CAROTID ARTERIOGRAMS AND BILATERAL VERTEBRAL ARTERY
ANGIOGRAMS:

PROCEDURE:
Following a full explanation of the procedure along with the
potential associated complications, an informed witnessed consent
was obtained.

The right groin was prepped and draped in the usual sterile fashion.
Thereafter using modified Seldinger technique, transfemoral access
into the right common femoral artery was obtained without
difficulty. Over a 0.035 French guidewire, a 5-French Pinnacle
sheath was inserted. Through this and also over a 0.035 French
guidewire, 5-French JB1 catheter was advanced to the aortic arch
region and selectively positioned in the right common carotid
artery, the right subclavian artery, the left common carotid artery
and the left subclavian artery.

Complications: There were no acute complications. The patient
tolerated the procedure well.

Medications utilized: Versed 1 mg IV. Fentanyl 50 mcg IV.

Contrast: Omnipaque 300 approximately 60 ml
FINDINGS: The right common carotid arteriogram demonstrates the right external
carotid artery and its major branches to be normal.

The right internal carotid artery at the bulb has a small shallow
ulceration. There is no associated narrowing however.

The vessel is otherwise seen to opacify normally to the cranial
skull base. Long segment narrowing of the cavernous segment is seen
secondary to a circumferential plaque. This is associated with
approximately 65% stenoses in the cavernous segment.

The supraclinoid segment is widely patent.

The right middle and the right anterior cerebral arteries opacify
normally into the capillary and the venous phases.

Simultaneous opacification via the anterior communicating artery of
the left anterior cerebral artery and the left middle cerebral
artery distribution is seen.

The right vertebral artery origin demonstrates approximately 60-63%
narrowing secondary to a smooth atherosclerotic plaque.

The vessel is otherwise seen to opacify to the cranial skull base.
Normal opacification is seen at the right vertebrobasilar junction
and the right posterior-inferior cerebellar artery.

The basilar artery demonstrates approximately 50% narrowing in the
mid segment on the lateral projection.

The right posterior cerebral artery demonstrates approximately 50%
narrowing proximally. The left posterior cerebral artery, superior
cerebellar arteries and the anterior inferior cerebellar arteries
are seen to opacify into the capillary and the venous phases.

Unopacified blood is seen in the basilar artery from the
contralateral vertebral artery.

The left common carotid arteriogram demonstrates complete
angiographic occlusion of the left internal carotid artery at the
bulb to the cranial skull base.

There is reconstitution of the distal cavernous left ICA from the
external carotid artery branches mainly the ophthalmic artery and
into the cavernous segment.

Antegrade flow is noted to the supraclinoid left ICA and
subsequently the left MCA distribution.

No evidence of string sign is seen.

The left vertebral artery origin demonstrates approximately 20-30%
narrowing.

The vessel distal to this opacifies to the cranial skull base with
focal areas of mild caliber irregularity probably representing
arteriosclerosis. The left posterior-inferior cerebellar artery is
widely patent as is the left vertebrobasilar junction.

The opacified portions of the basilar artery, the posterior cerebral
arteries and the anterior inferior cerebellar arteries and the
superior cerebellar arteries are grossly normal into the capillary
and venous phases.
IMPRESSION: IMPRESSION
Approximately 65% stenosis of the right internal carotid artery
cavernous segment secondary to a segmental smooth atherosclerotic
plaque.

Approximately 60 to 63% narrowing of the origin of the right
vertebral artery.

Approximately 50% narrowing of the mid basilar artery on the lateral
projection.

The angiographic findings were reviewed with the patient and the
patient's family.

The patient has been asked to continue taking his aspirin and
Plavix. However the patient was advised to refrain from looking up
which induces the patient's symptoms and also, to see his primary
care doctor in order to adjust his antihypertensive such that the
blood pressure is in the range of approximately 130 mm/Hg. This is
to allow improved perfusion to the posterior fossa. Should the
patient continue to have the VBI like symptoms despite this,
endovascular revascularization of the right vertebral artery would
be more appropriate at that time. A follow-up clinic appointment has
been given for 2 months time.

## 2015-09-17 ENCOUNTER — Other Ambulatory Visit (HOSPITAL_COMMUNITY): Payer: Self-pay

## 2015-09-17 ENCOUNTER — Ambulatory Visit: Payer: Self-pay | Admitting: Family

## 2015-10-03 IMAGING — CR DG HAND COMPLETE 3+V*R*
4 series · 4 of 4 positions shown · non-contrast
Comparison: None.

CLINICAL DATA: Injury 2 days ago. Pain in the metacarpals radiating
towards the rest

EXAM:
RIGHT HAND - COMPLETE 3+ VIEW

[view not recorded (1 of 4)]
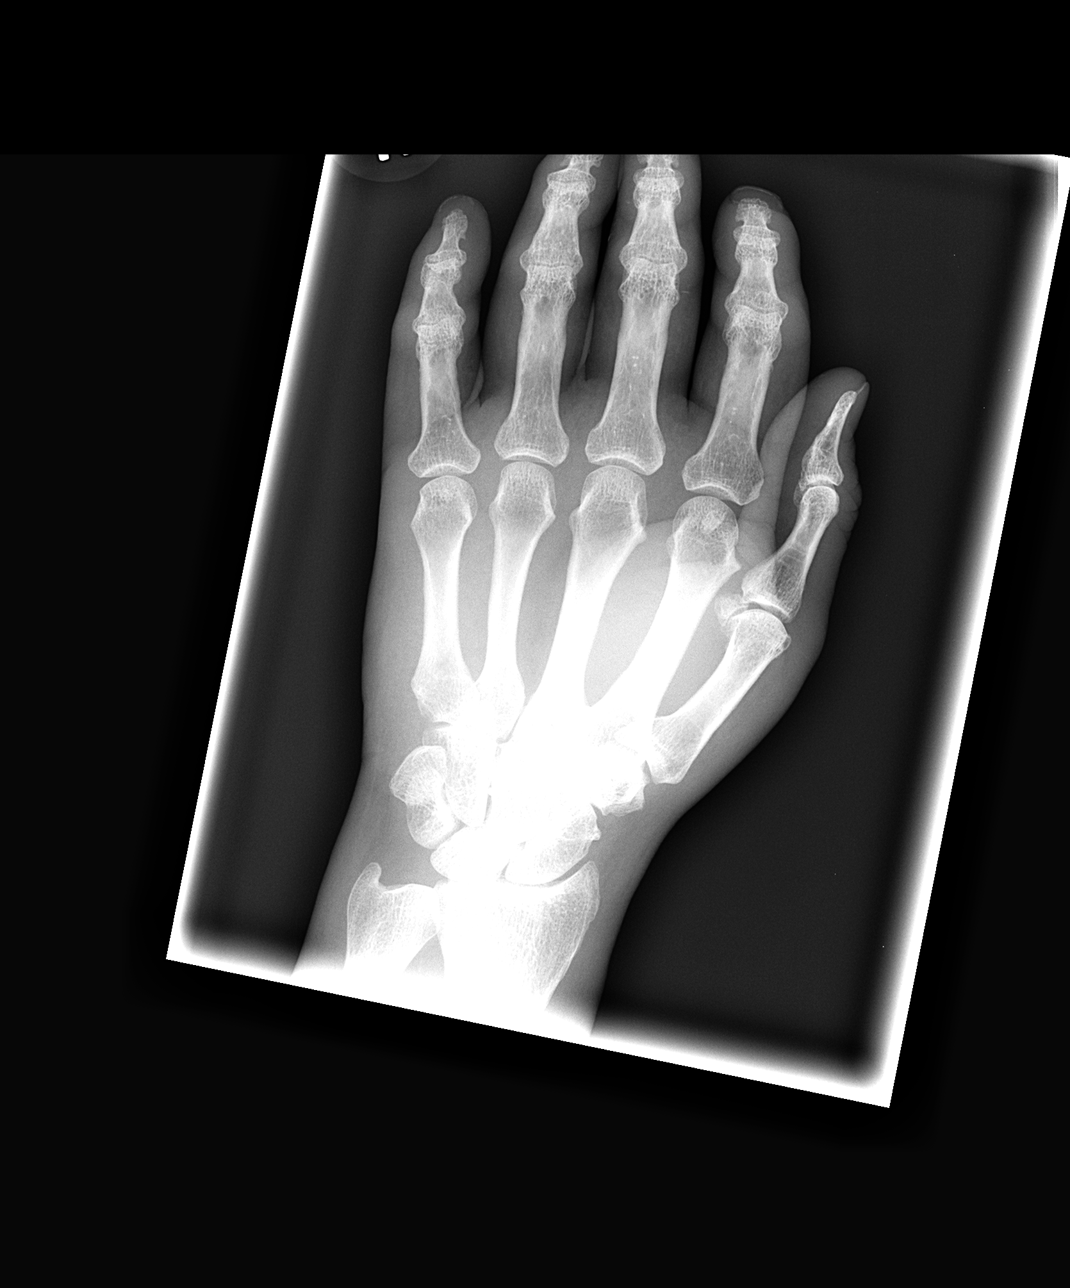

[view not recorded (2 of 4)]
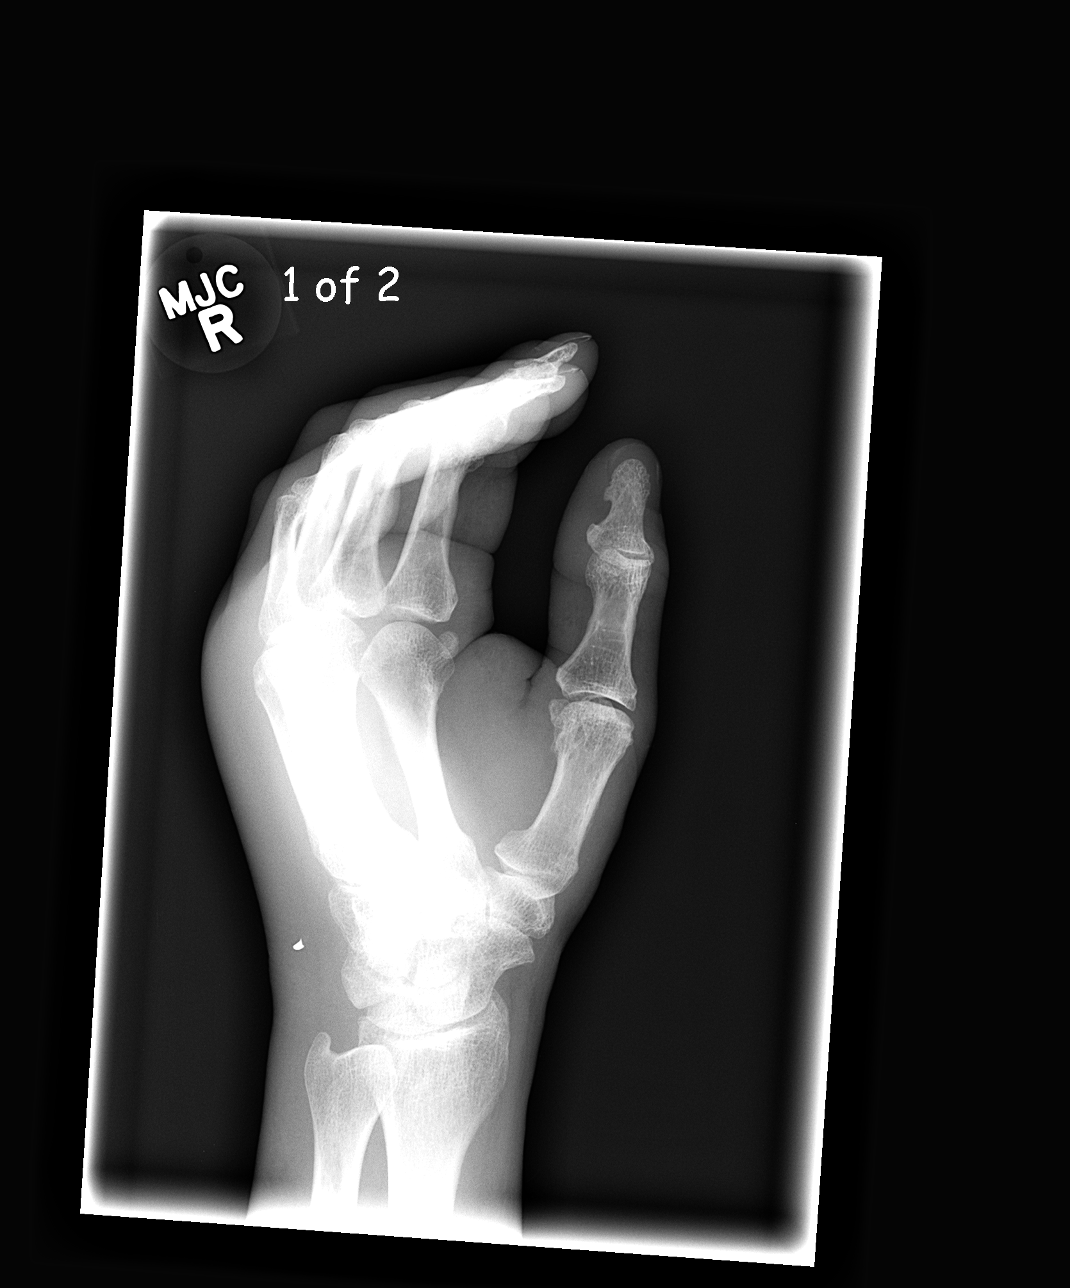

[view not recorded (3 of 4)]
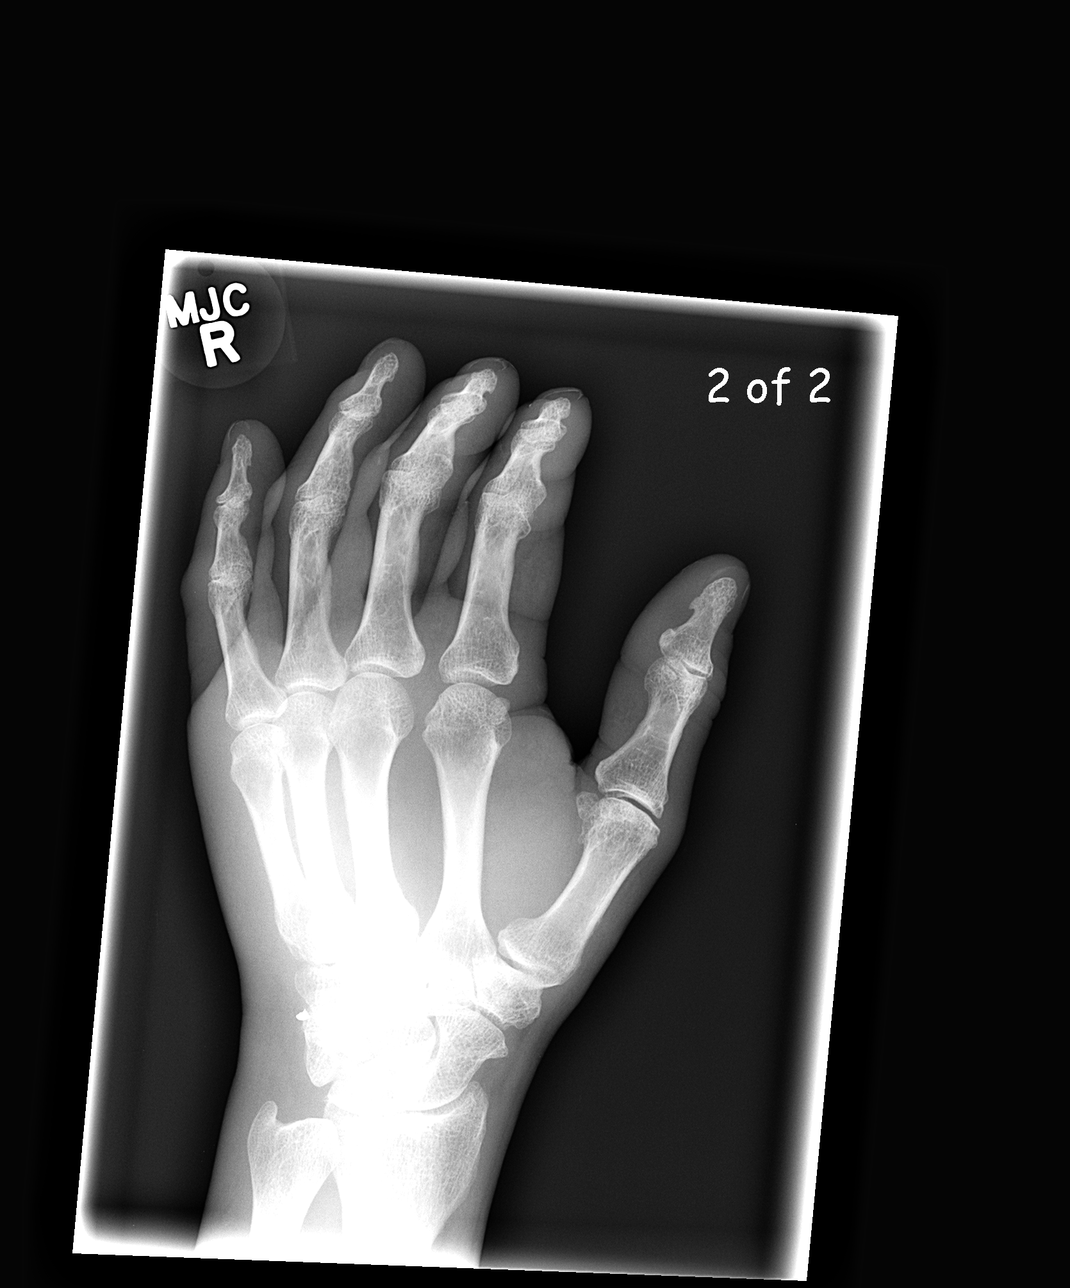

[view not recorded (4 of 4)]
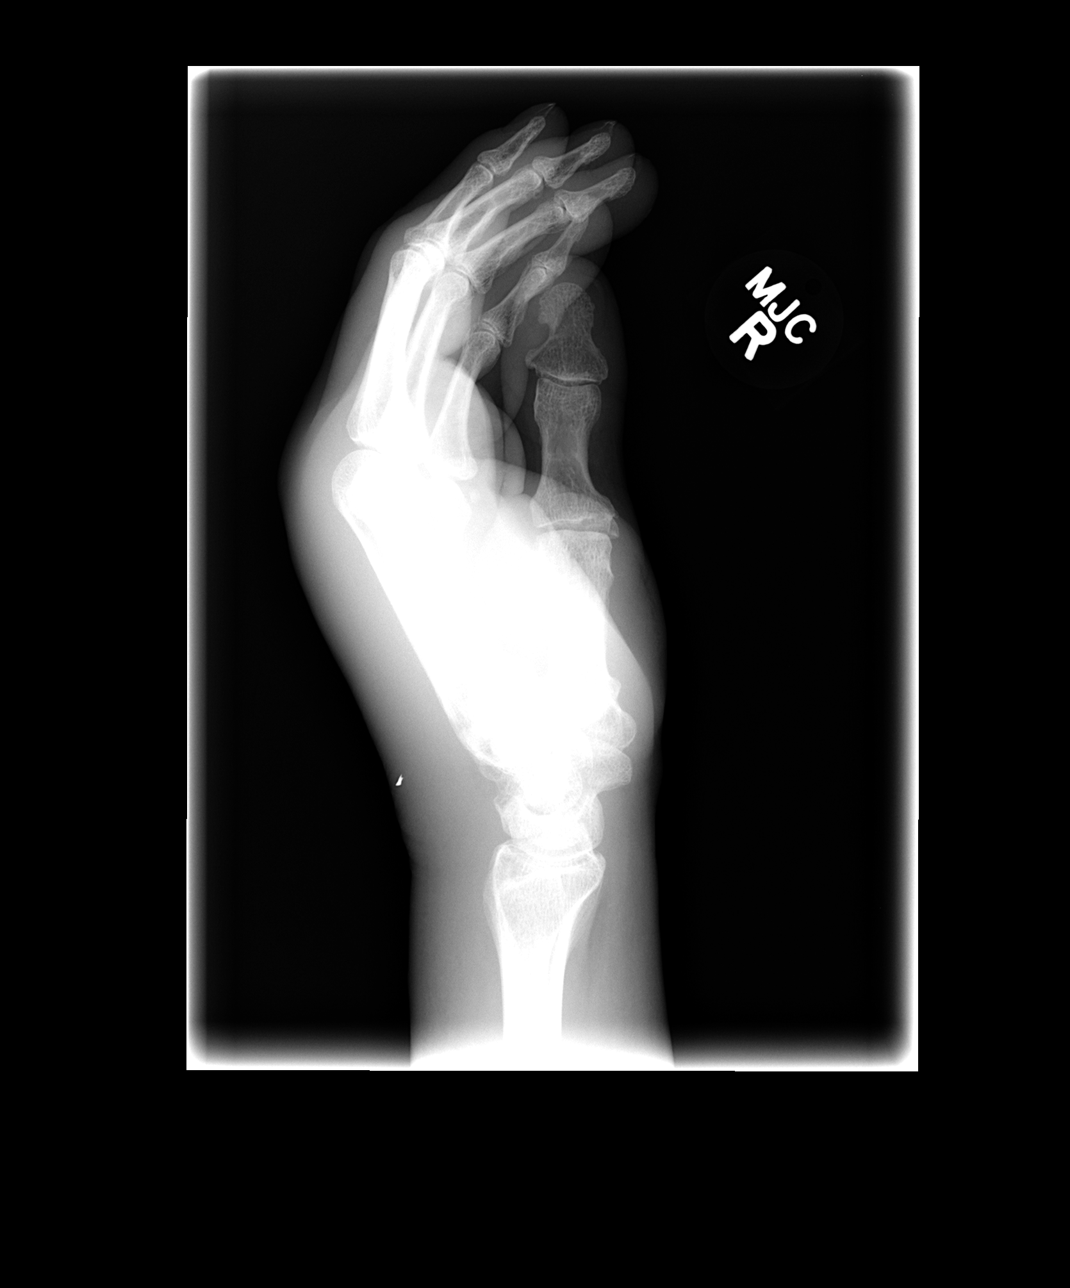

[4 of 4 positions shown; findings below may reference images not displayed]

FINDINGS: There is pronounced dorsal soft tissue swelling. There is no
evidence of fracture or dislocation. There are ordinary
osteoarthritic changes at the metacarpal phalangeal joint of the
thumb.

There is a metallic foreign body within the scan or subcutaneous
tissues along the dorsum of the wrist region, projected over the mid
carpus dorsally. This measures approximately 4 mm in size. This
could be recently acquired or could be chronic.
IMPRESSION: No acute bony finding. Osteoarthritis of the metacarpal phalanx real
joint of the thumb.

Pronounced dorsal soft tissue swelling.

4 mm metallic foreign object in the scan or subcutaneous tissues
dorsal to the carpus. Chronicity of this is uncertain based on
radiography.

## 2015-10-05 IMAGING — CR DG CHEST 2V
2 series · 2 of 2 positions shown · non-contrast
Comparison: 05/12/2013

CLINICAL DATA: Preop exam.  Pain and swelling of the left hand.

EXAM:
CHEST  2 VIEW

[w chest pa]
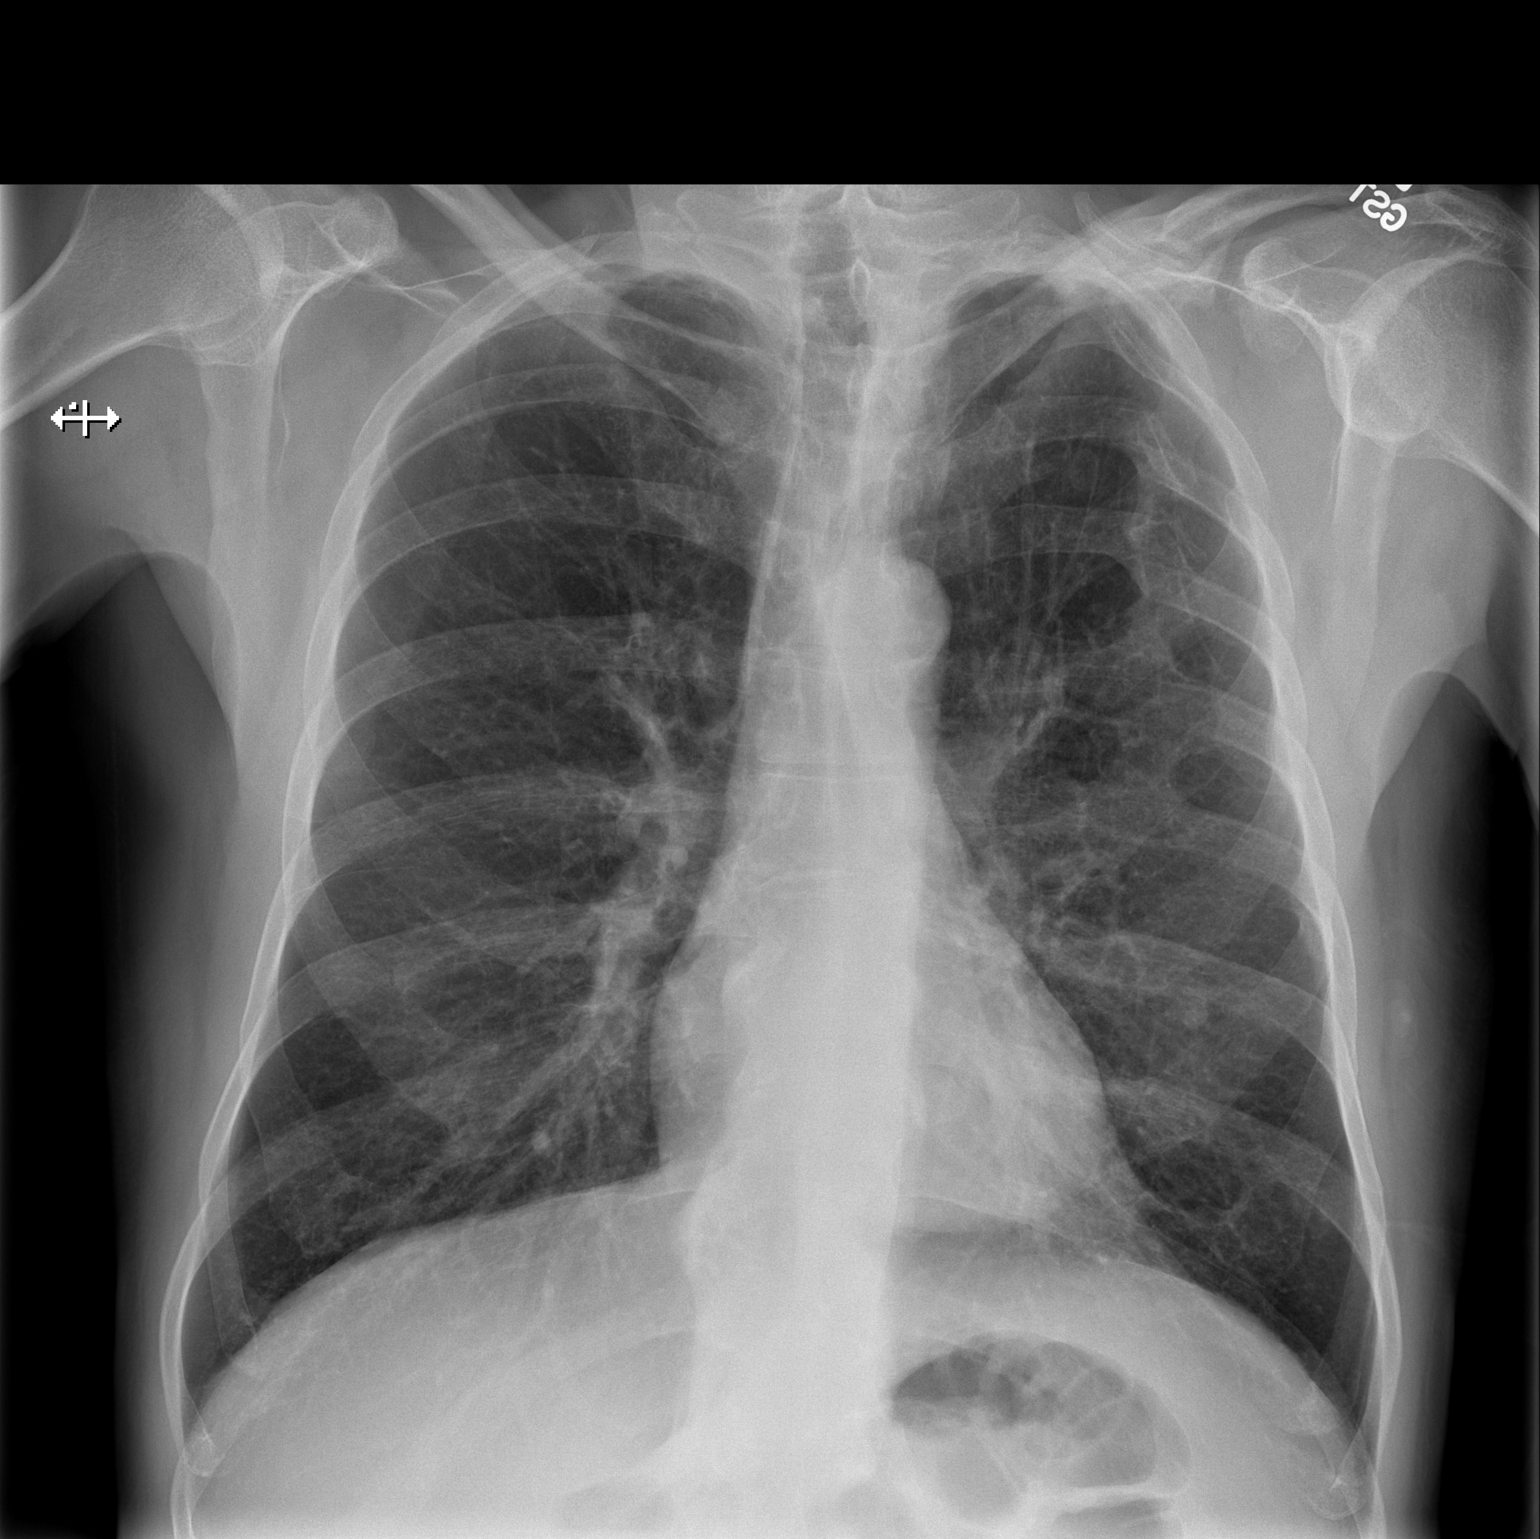

[w chest lat]
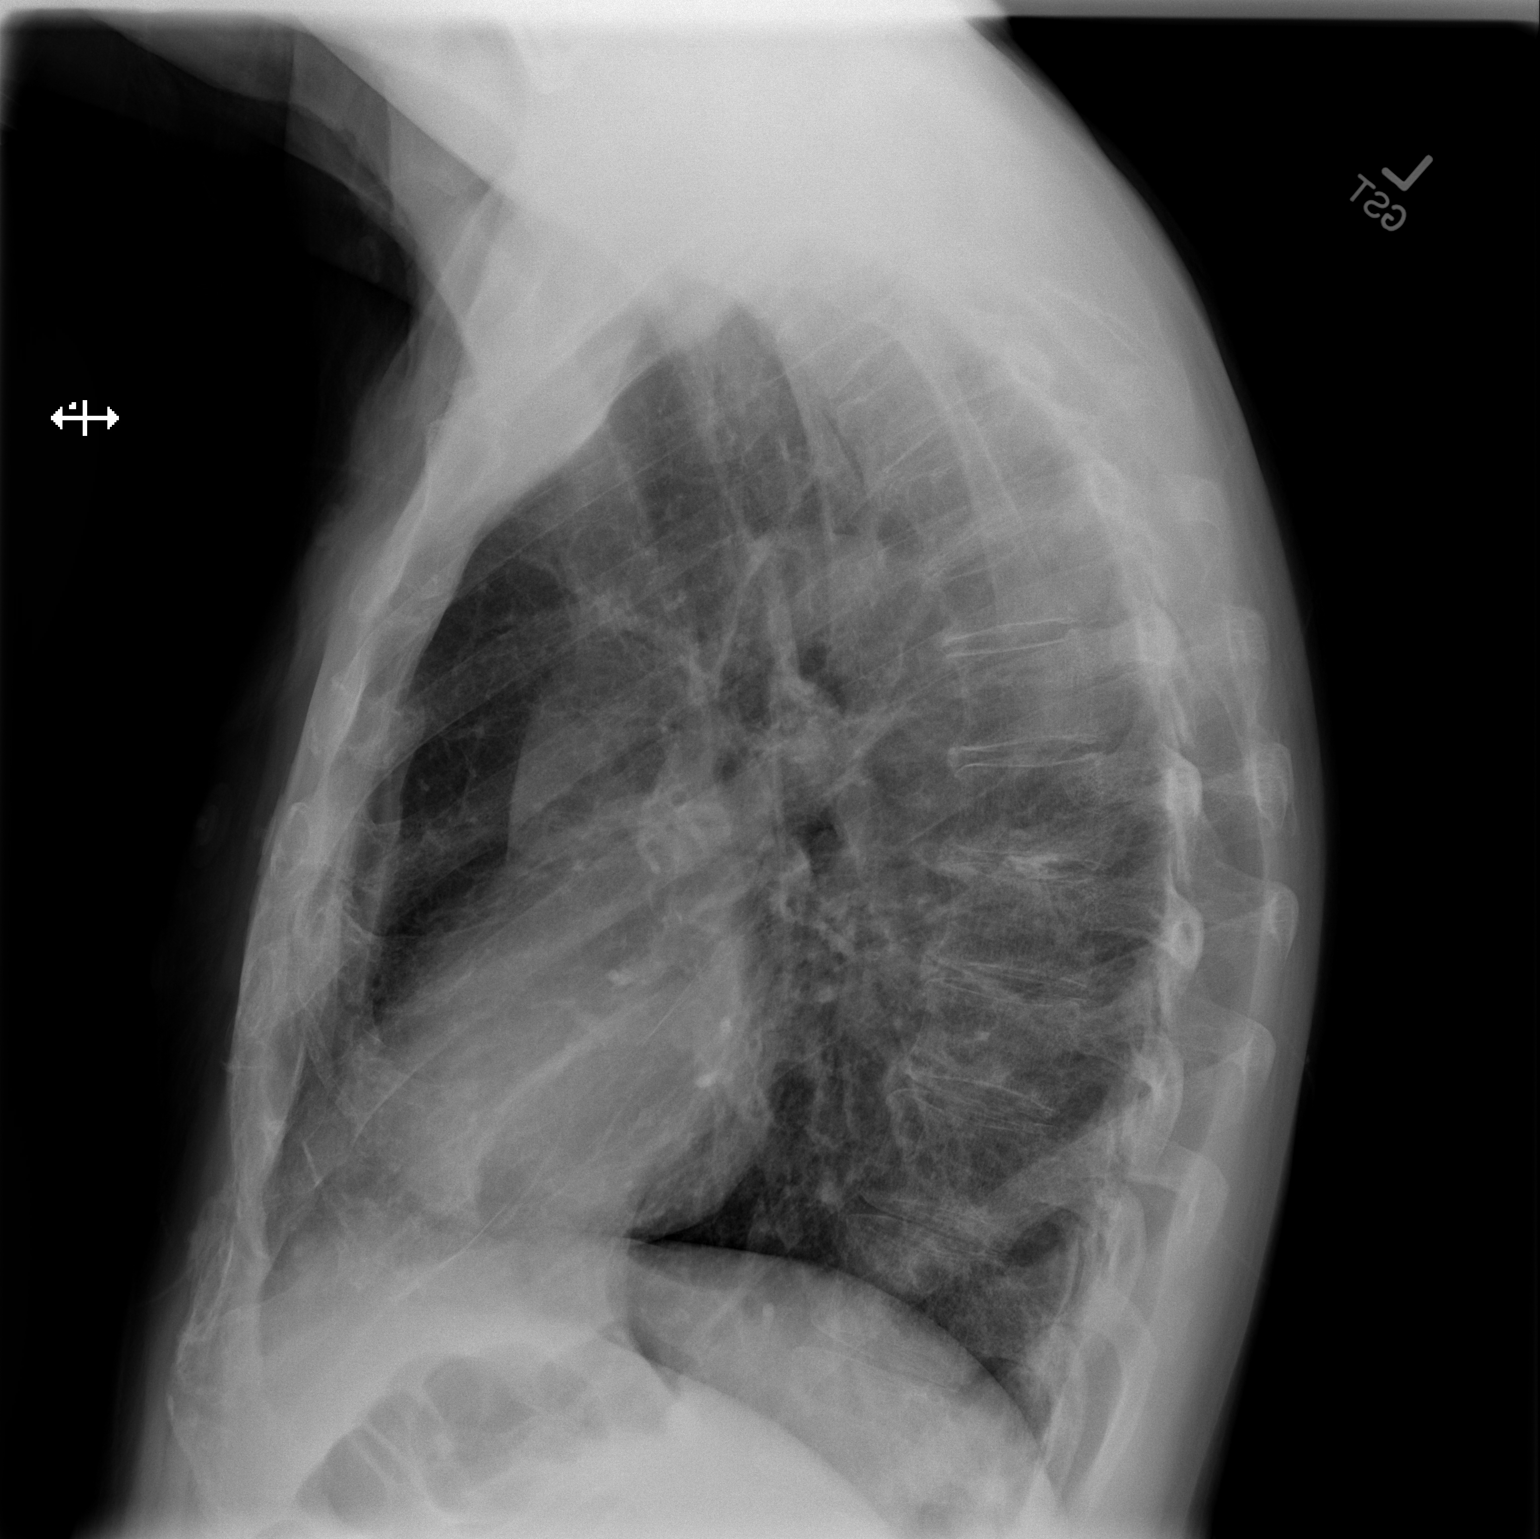

[2 of 2 positions shown; findings below may reference images not displayed]

FINDINGS: Heart, mediastinum and hila are unremarkable.

There is bilateral interstitial thickening. The lungs are
hyperexpanded. Mild scarring is noted at the apices. No lung
consolidation or edema. No pleural effusion or pneumothorax.

Multiple old healed left rib fractures. Patient has had a previous
low anterior cervical spine fusion.
IMPRESSION: No acute cardiopulmonary disease. Stable appearance from the prior
study.

## 2015-10-09 ENCOUNTER — Encounter: Payer: Self-pay | Admitting: Family

## 2015-10-10 ENCOUNTER — Ambulatory Visit (INDEPENDENT_AMBULATORY_CARE_PROVIDER_SITE_OTHER)
Admission: RE | Admit: 2015-10-10 | Discharge: 2015-10-10 | Disposition: A | Discharge status: Home / Self Care | Payer: Medicaid Other | Source: Ambulatory Visit | Attending: Family | Admitting: Family

## 2015-10-10 ENCOUNTER — Ambulatory Visit (HOSPITAL_COMMUNITY): Payer: Medicaid Other

## 2015-10-10 ENCOUNTER — Ambulatory Visit (HOSPITAL_COMMUNITY)
Admission: RE | Admit: 2015-10-10 | Discharge: 2015-10-10 | Disposition: A | Discharge status: Home / Self Care | Payer: Medicaid Other | Source: Ambulatory Visit | Attending: Family | Admitting: Family

## 2015-10-10 DIAGNOSIS — Z720 Tobacco use: Secondary | ICD-10-CM

## 2015-10-10 DIAGNOSIS — F17200 Nicotine dependence, unspecified, uncomplicated: Secondary | ICD-10-CM | POA: Insufficient documentation

## 2015-10-10 DIAGNOSIS — Z95828 Presence of other vascular implants and grafts: Secondary | ICD-10-CM | POA: Diagnosis not present

## 2015-10-10 DIAGNOSIS — I779 Disorder of arteries and arterioles, unspecified: Secondary | ICD-10-CM

## 2015-10-11 ENCOUNTER — Ambulatory Visit: Payer: Self-pay | Admitting: Family

## 2015-10-15 ENCOUNTER — Ambulatory Visit: Payer: Medicaid Other | Admitting: Family

## 2015-10-18 ENCOUNTER — Encounter: Payer: Self-pay | Admitting: Family

## 2015-10-22 ENCOUNTER — Ambulatory Visit: Payer: Self-pay | Admitting: Family

## 2015-11-12 ENCOUNTER — Encounter (HOSPITAL_COMMUNITY): Payer: Self-pay

## 2015-11-12 ENCOUNTER — Ambulatory Visit: Payer: Self-pay | Admitting: Family

## 2015-11-12 ENCOUNTER — Other Ambulatory Visit (HOSPITAL_COMMUNITY): Payer: Self-pay

## 2015-11-26 ENCOUNTER — Telehealth (HOSPITAL_COMMUNITY): Payer: Self-pay

## 2015-11-26 IMAGING — CR DG CHEST 2V
2 series · 2 of 2 positions shown · non-contrast
Comparison: 06/11/2013 and earlier.

CLINICAL DATA: 52-year-old male shortness of breath, cough, chronic
obstructed pulmonary disease. Initial encounter.

EXAM:
CHEST  2 VIEW

[w chest pa]
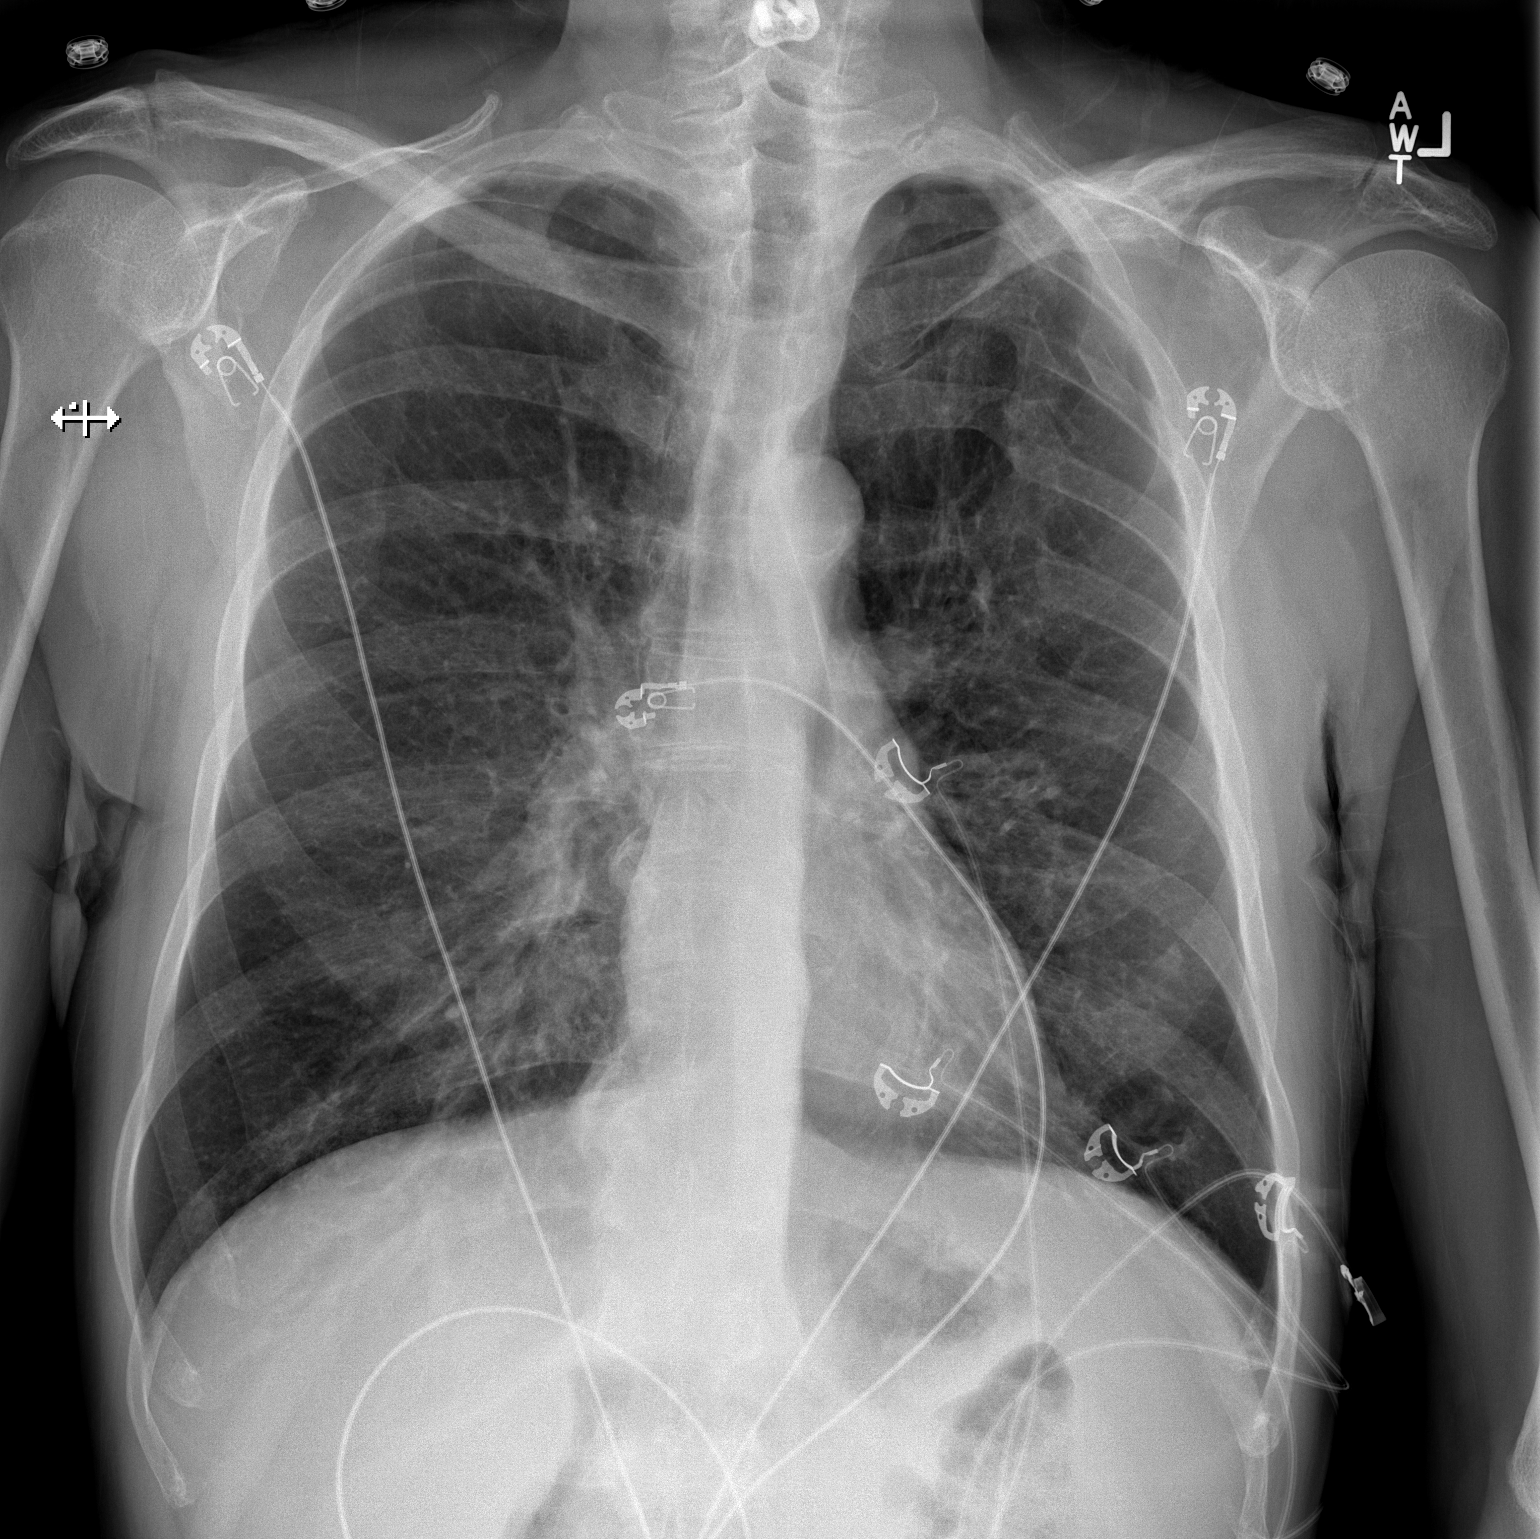

[w chest lat]
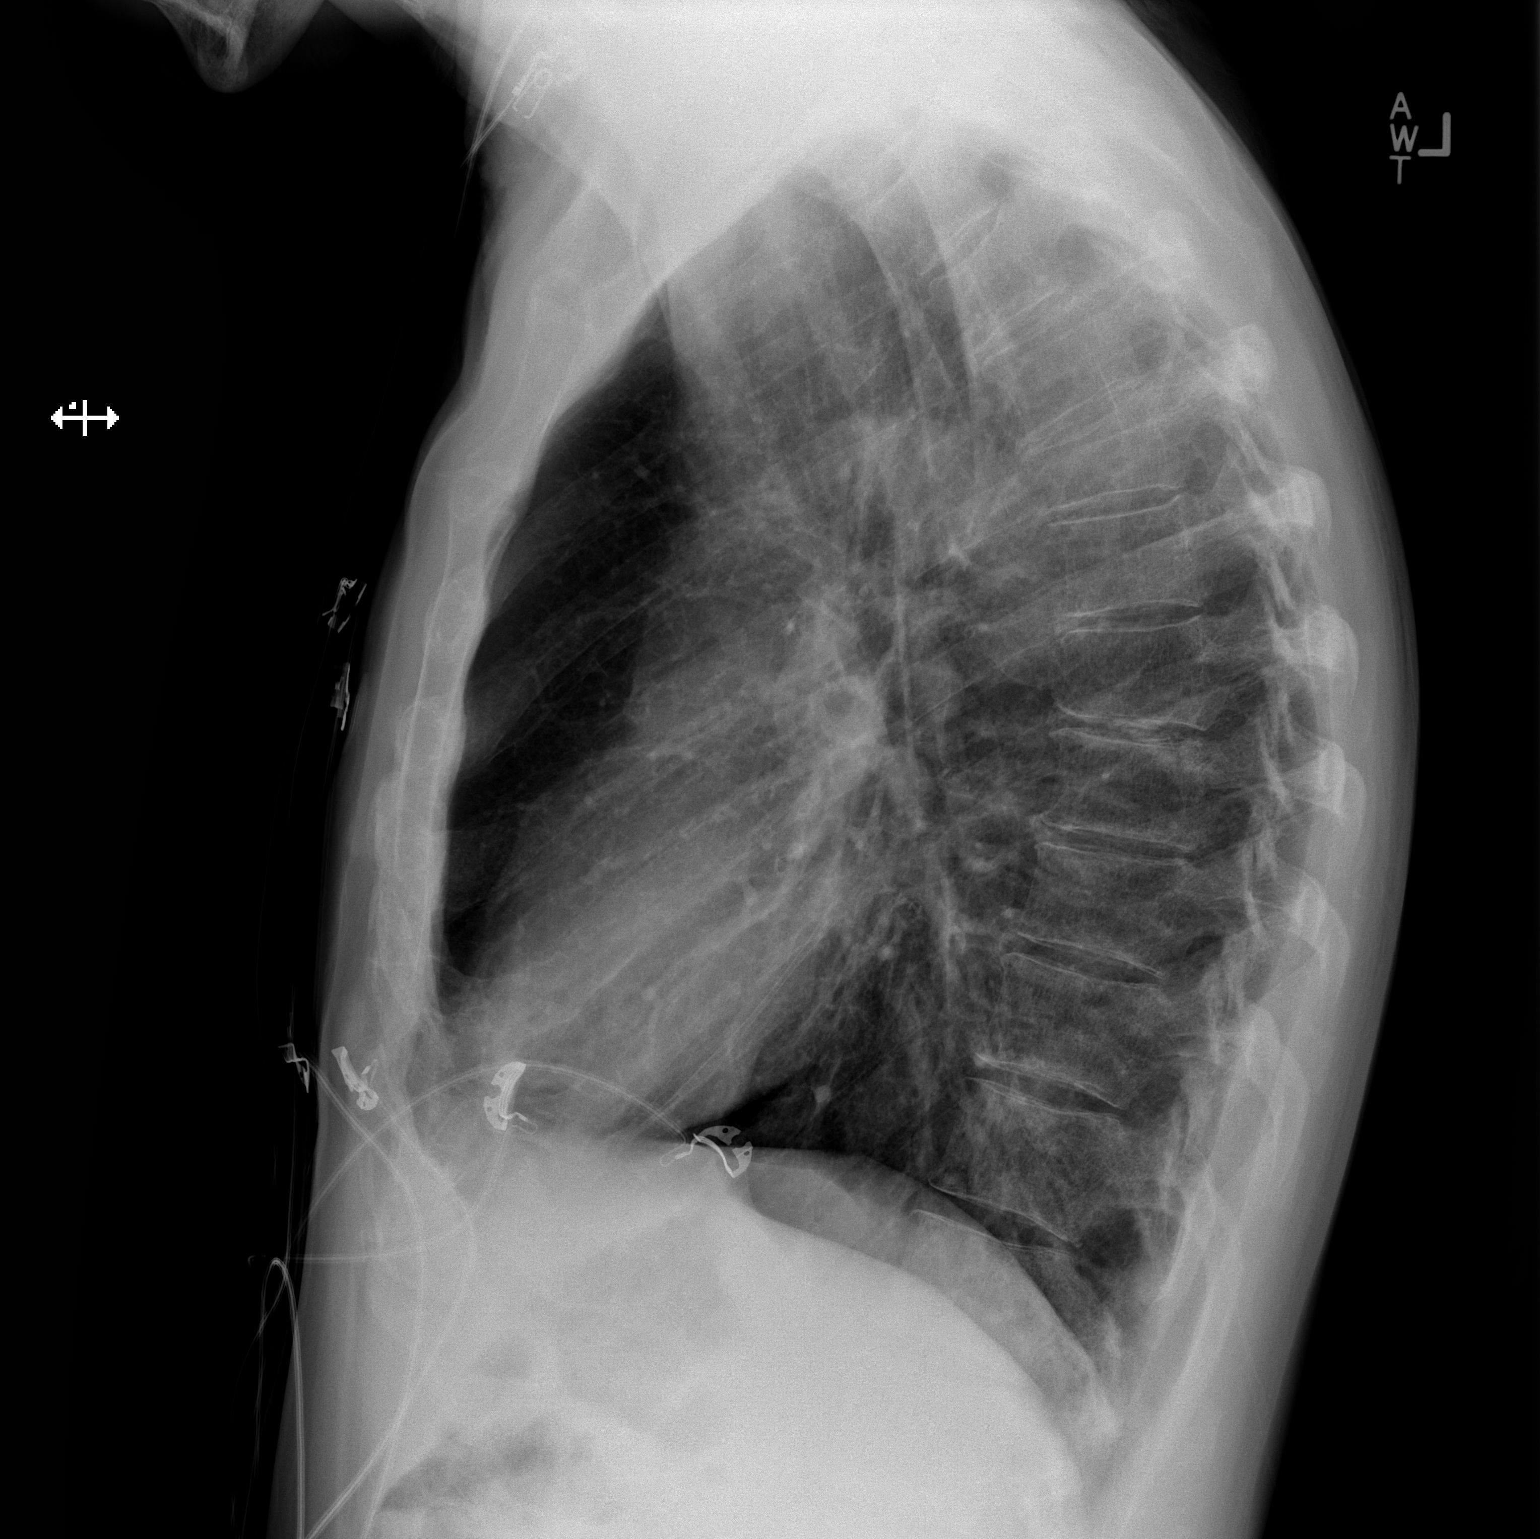

[2 of 2 positions shown; findings below may reference images not displayed]

FINDINGS: Stable lung volumes. Normal cardiac size and mediastinal contours.
Visualized tracheal air column is within normal limits. No
pneumothorax or pulmonary edema. No pleural effusion or confluent
pulmonary opacity. Stable scattered areas of attenuation of vascular
markings suggestive of emphysema. No acute pulmonary opacity
identified. No acute osseous abnormality identified. Partially
visible cervical ACDF hardware.
IMPRESSION: No acute cardiopulmonary abnormality.

## 2015-11-26 NOTE — Telephone Encounter (Signed)
Called to schedule f/u, left message for pt to return call to schedule consult. AW

## 2015-12-17 ENCOUNTER — Ambulatory Visit (HOSPITAL_COMMUNITY)
Admission: RE | Admit: 2015-12-17 | Discharge: 2015-12-17 | Disposition: A | Discharge status: Home / Self Care | Payer: Medicaid Other | Source: Ambulatory Visit | Attending: Interventional Radiology | Admitting: Interventional Radiology

## 2015-12-17 DIAGNOSIS — I729 Aneurysm of unspecified site: Secondary | ICD-10-CM

## 2015-12-17 HISTORY — PX: IR GENERIC HISTORICAL: IMG1180011

## 2015-12-18 ENCOUNTER — Encounter (HOSPITAL_COMMUNITY): Payer: Self-pay | Admitting: Interventional Radiology

## 2015-12-19 ENCOUNTER — Encounter: Payer: Self-pay | Admitting: Internal Medicine

## 2016-01-09 ENCOUNTER — Encounter: Payer: Self-pay | Admitting: Family

## 2016-01-16 ENCOUNTER — Other Ambulatory Visit: Payer: Self-pay

## 2016-01-16 DIAGNOSIS — I739 Peripheral vascular disease, unspecified: Secondary | ICD-10-CM

## 2016-01-16 IMAGING — CR DG CHEST 1V PORT
1 series · 1 of 1 positions shown · non-contrast
Comparison: 08/02/2013

CLINICAL DATA: Shortness of breath.  Smoker.

EXAM:
PORTABLE CHEST - 1 VIEW

[AP]
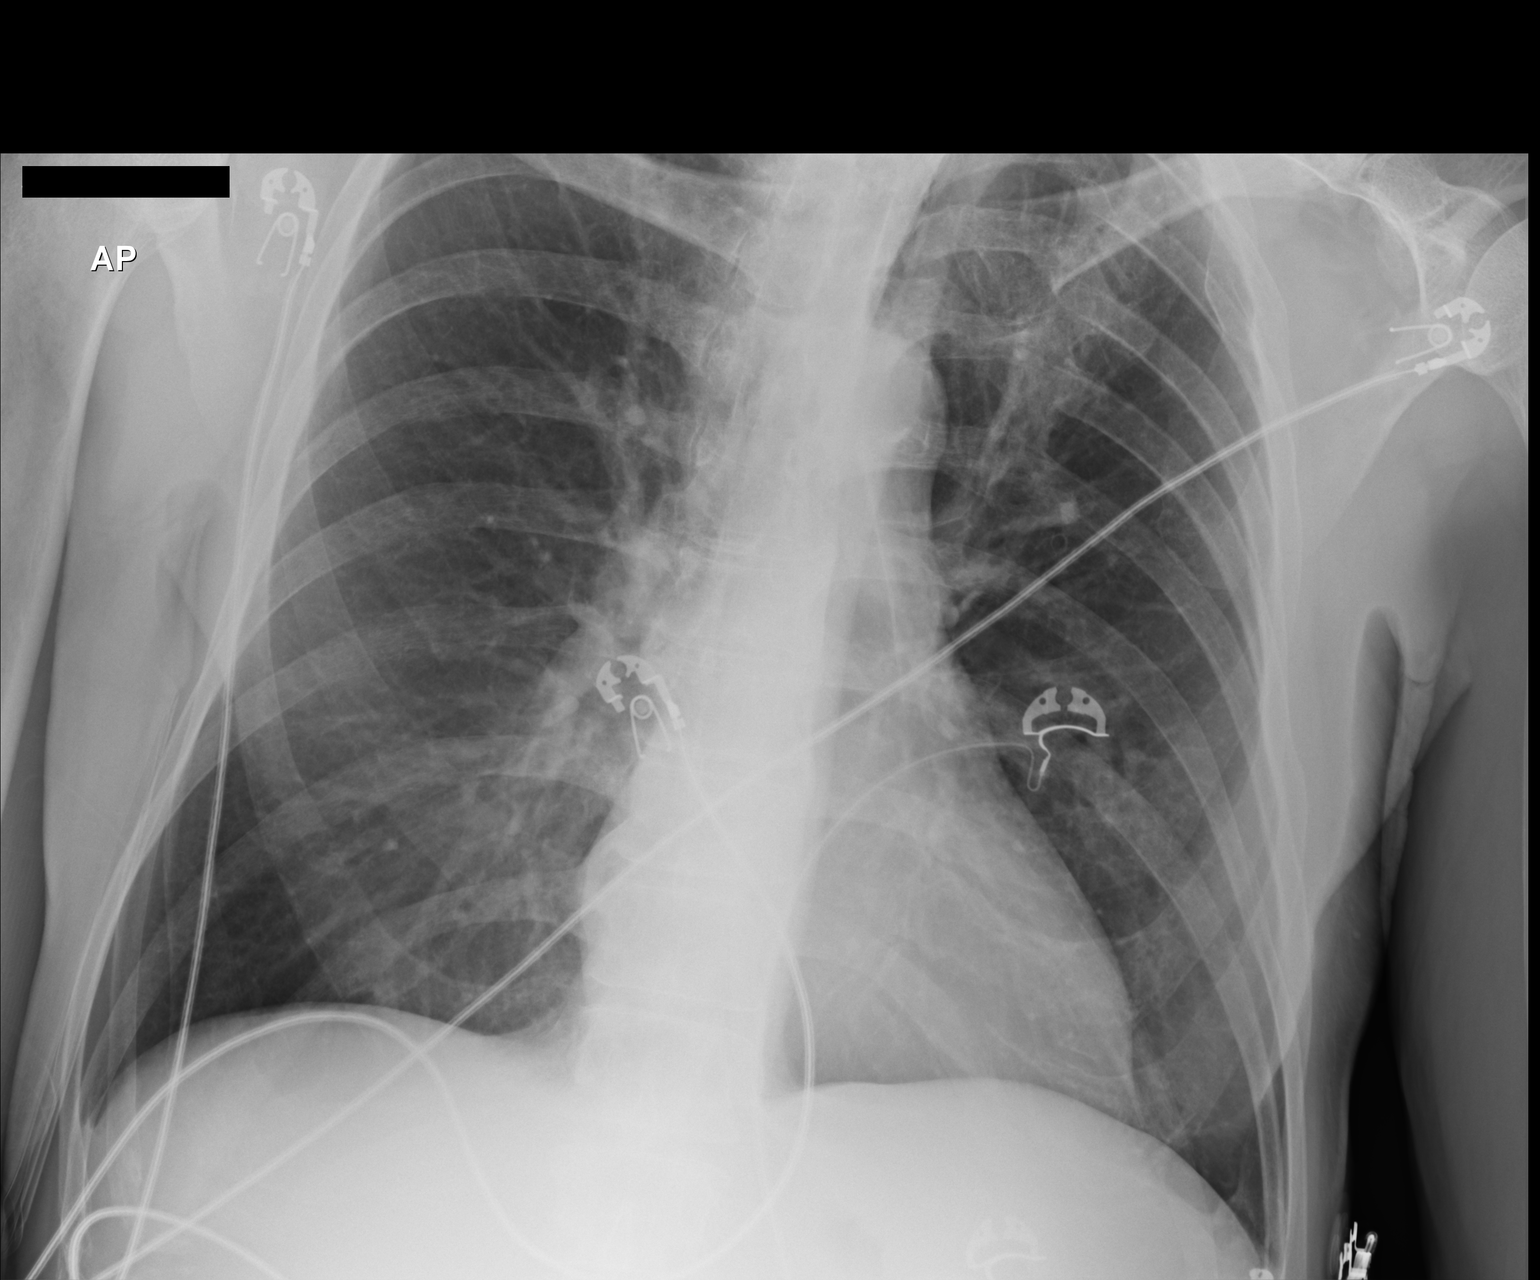

[1 of 1 positions shown; findings below may reference images not displayed]

FINDINGS: Normal heart size and pulmonary vascularity. Emphysematous changes
in the lungs. Increasing linear opacities in the left upper lung
suggesting developing focal infiltration. No blunting of
costophrenic angles. No pneumothorax. Multiple old left rib
fractures and old left clavicular fracture.
IMPRESSION: Developing focal linear area of infiltration or atelectasis in the
left upper lung.

## 2016-01-16 MED ORDER — CLOPIDOGREL BISULFATE 75 MG PO TABS: 75.0000 mg | ORAL_TABLET | Freq: Every day | ORAL | 2 refills | Status: DC

## 2016-01-21 IMAGING — CT CT ANGIO CHEST
3 of 7 series · 18 of 36 positions shown · IV contrast (OMNIPAQUE)
Comparison: 11/22/2011 and earlier studies

CLINICAL DATA: rule out PE, malignancy, recent weight loss.

EXAM:
CT ANGIOGRAPHY CHEST WITH CONTRAST
TECHNIQUE: Multidetector CT imaging of the chest was performed using the
standard protocol during bolus administration of intravenous
contrast. Multiplanar CT image reconstructions and MIPs were
obtained to evaluate the vascular anatomy.
CONTRAST:  100mL OMNIPAQUE IOHEXOL 350 MG/ML SOLN

[Series 5: pe thins @ 1mm · axial · 0.73mm/px · z∈[+1577,+1874]mm · 15 of 341 slices shown]
[im 22/341  lung]
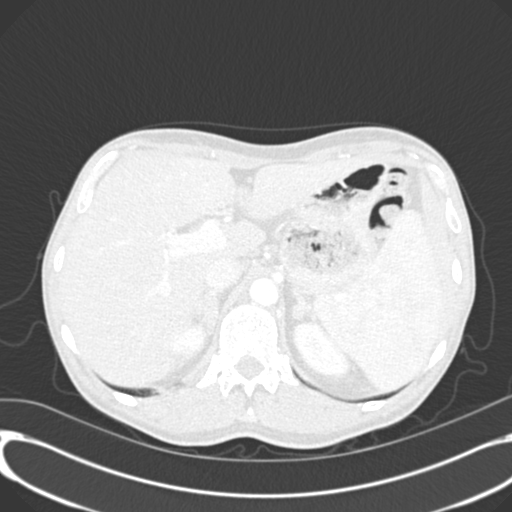
[im 43/341  mediastinal]
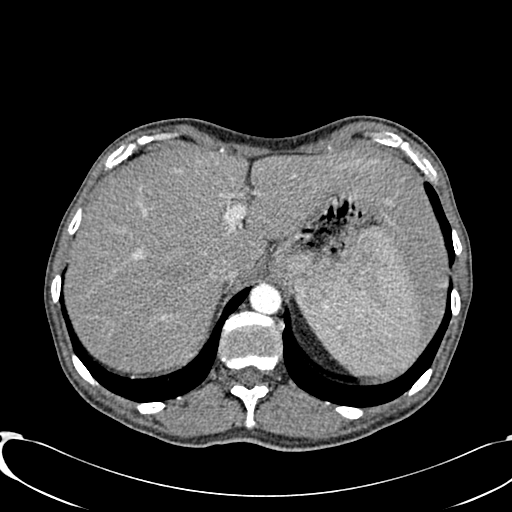
[im 64/341  lung]
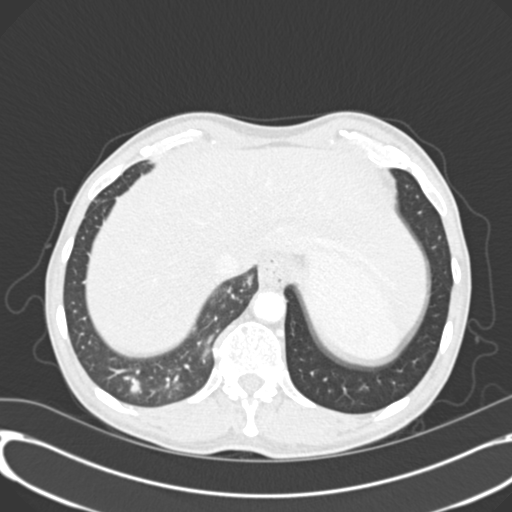
[im 86/341  mediastinal]
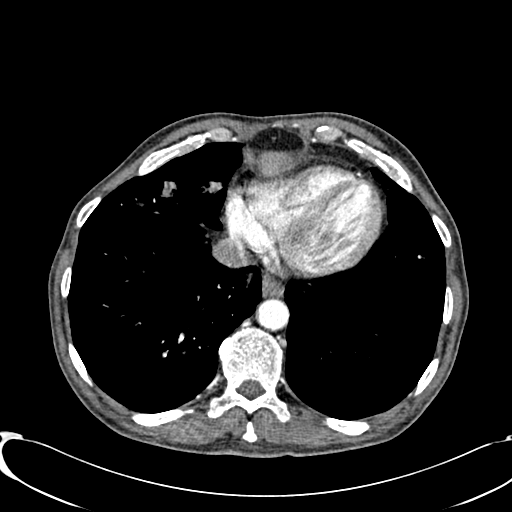
[im 107/341  lung]
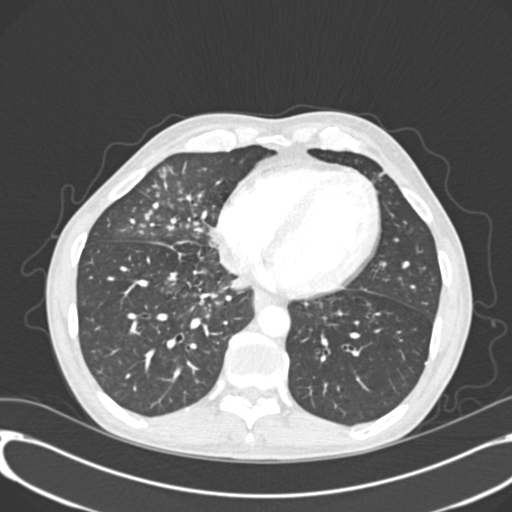
[im 128/341  mediastinal]
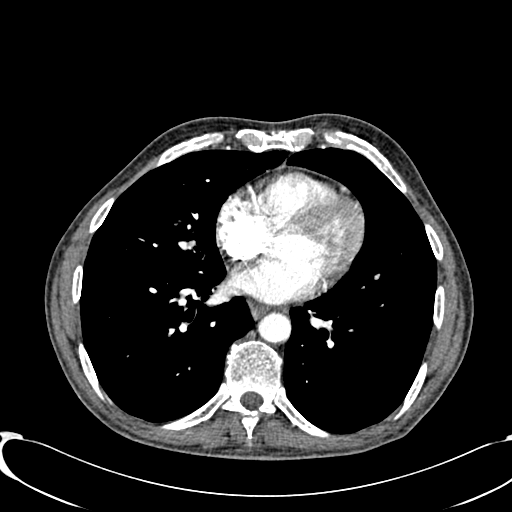
[im 149/341  lung]
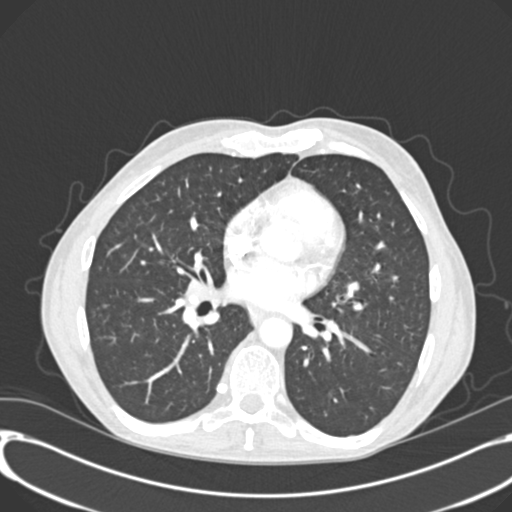
[im 171/341  mediastinal]
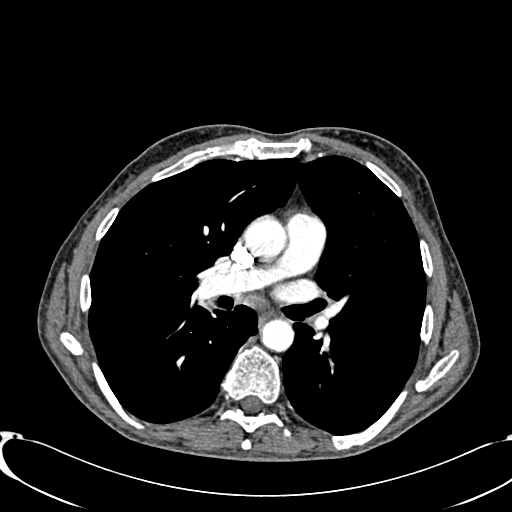
[im 192/341  lung]
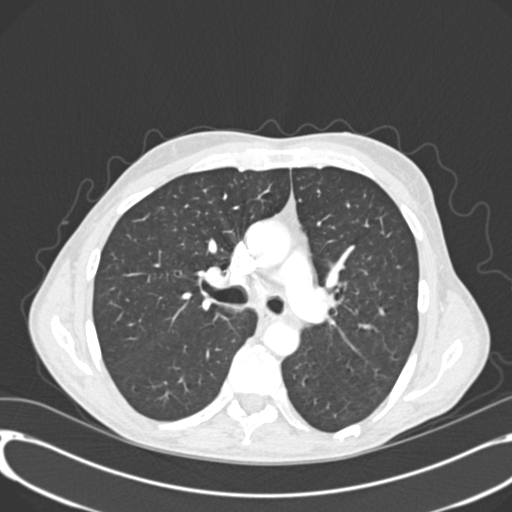
[im 213/341  mediastinal]
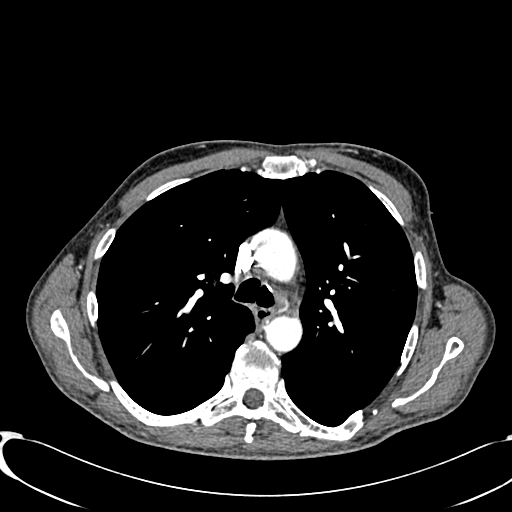
[im 234/341  lung]
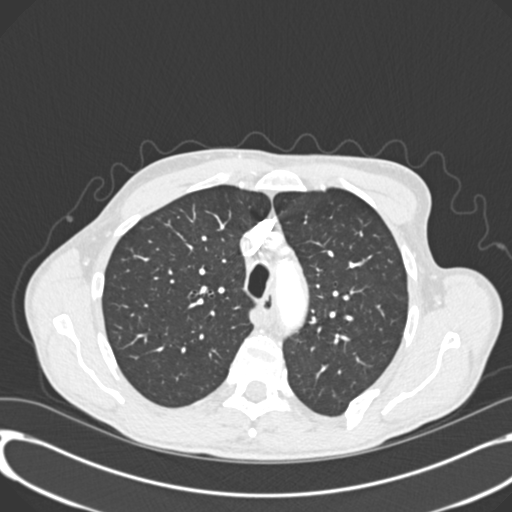
[im 256/341  mediastinal]
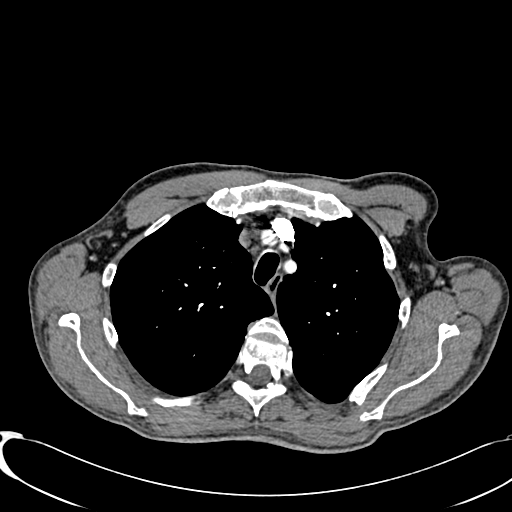
[im 277/341  lung]
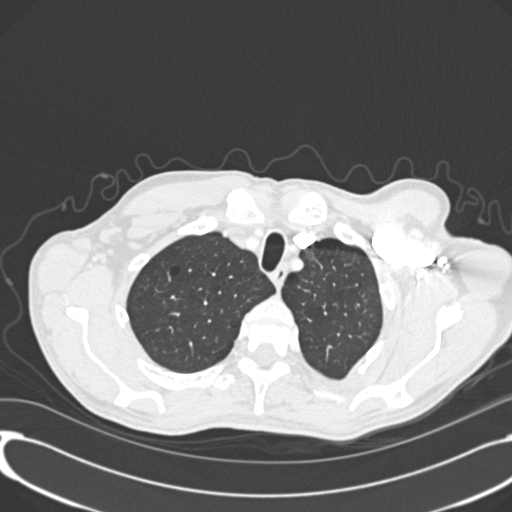
[im 298/341  mediastinal]
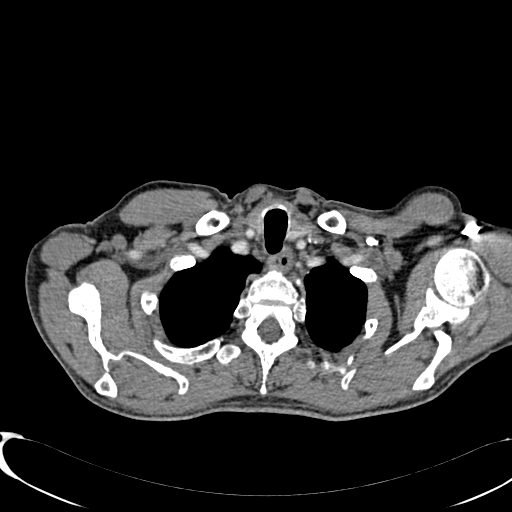
[im 319/341  lung]
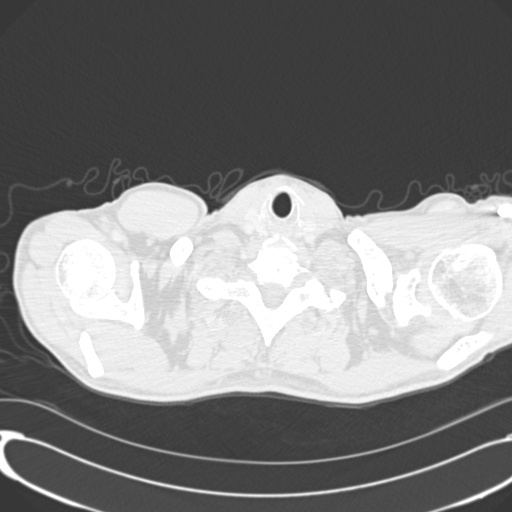

[Series 7: pe lung windows · axial · 0.73mm/px · z∈[+1648,+1730]mm · 2 of 108 slices shown]
[im 27/108  mediastinal]
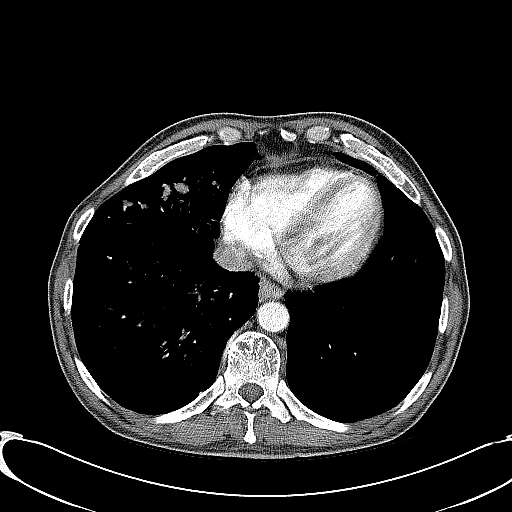
[im 54/108  mediastinal]
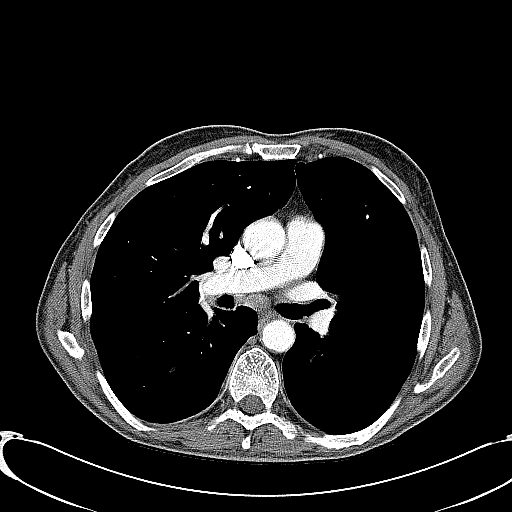

[Series 602: coronal images · coronal · 0.73mm/px · 1 of 130 slices shown]
[im 65/130  mediastinal]
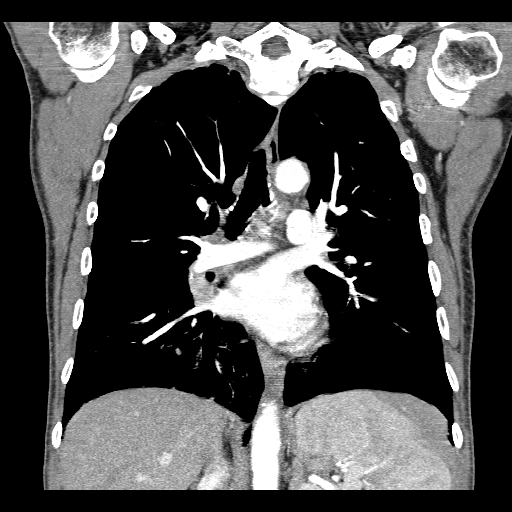

[18 of 36 positions shown; findings below may reference images not displayed]

FINDINGS: Satisfactory opacification of pulmonary arteries noted, and there is
no evidence of pulmonary emboli. Patient motion during the
acquisition degrades some of the images. Patent superior and
inferior pulmonary veins bilaterally. Coronary and aortic
calcifications. Adequate contrast opacification of the thoracic
aorta with no evidence of dissection, aneurysm, or stenosis. There
is classic 3 vessel Brachiocephalic arch anatomy without proximal
stenosis. No pleural or pericardial effusion. Several prominent
lymph nodes: 12 mm subcarinal station, 8 mm right hilar, 15 mm right
infrahilar, 1 cm AP window . Patchy somewhat nodular areas of
airspace consolidation in the inferomedial right middle lobe,
anterior, medial, and posterior basal segments right lower lobe.
Symmetric biapical pleural parenchymal scarring. Left lung otherwise
clear. Mild T5 compression fracture deformity, present since at
least 01/26/2012. Multiple old healed left rib fracture deformities.
Old left clavicle fracture deformity. Visualized portions of upper
abdomen unremarkable.

Review of the MIP images confirms the above findings.
IMPRESSION: 1. Negative for acute PE or thoracic aortic dissection.
2. Patchy airspace opacities in right middle and lower lobes
suggesting pneumonia. Consider follow-up to document appropriate
resolution.
3. Mild right hilar and mediastinal adenopathy, possibly reactive
but nonspecific.
4. Atherosclerosis, including aortic and coronary artery disease.
Please note that although the presence of coronary artery calcium
documents the presence of coronary artery disease, the severity of
this disease and any potential stenosis cannot be assessed on this
non-gated CT examination. Assessment for potential risk factor
modification, dietary therapy or pharmacologic therapy may be
warranted, if clinically indicated.

## 2016-01-22 ENCOUNTER — Ambulatory Visit (HOSPITAL_COMMUNITY): Payer: Medicaid Other

## 2016-01-22 ENCOUNTER — Encounter: Payer: Self-pay | Admitting: Family

## 2016-01-22 ENCOUNTER — Ambulatory Visit (INDEPENDENT_AMBULATORY_CARE_PROVIDER_SITE_OTHER): Payer: Medicaid Other | Admitting: Family

## 2016-01-22 VITALS — BP 122/73 | HR 93 | Temp 98.7°F | Resp 18 | Ht 73.0 in | Wt 176.0 lb

## 2016-01-22 DIAGNOSIS — Z95828 Presence of other vascular implants and grafts: Secondary | ICD-10-CM | POA: Diagnosis not present

## 2016-01-22 DIAGNOSIS — I771 Stricture of artery: Secondary | ICD-10-CM | POA: Diagnosis not present

## 2016-01-22 DIAGNOSIS — F17200 Nicotine dependence, unspecified, uncomplicated: Secondary | ICD-10-CM

## 2016-01-22 DIAGNOSIS — I779 Disorder of arteries and arterioles, unspecified: Secondary | ICD-10-CM | POA: Diagnosis not present

## 2016-01-22 NOTE — Patient Instructions (Signed)
Peripheral Vascular Disease Peripheral vascular disease (PVD) is a disease of the blood vessels that are not part of your heart and brain. A simple term for PVD is poor circulation. In most cases, PVD narrows the blood vessels that carry blood from your heart to the rest of your body. This can result in a decreased supply of blood to your arms, legs, and internal organs, like your stomach or kidneys. However, it most often affects a person's lower legs and feet. There are two types of PVD.  Organic PVD. This is the more common type. It is caused by damage to the structure of blood vessels.  Functional PVD. This is caused by conditions that make blood vessels contract and tighten (spasm). Without treatment, PVD tends to get worse over time. PVD can also lead to acute ischemic limb. This is when an arm or limb suddenly has trouble getting enough blood. This is a medical emergency. Follow these instructions at home:  Take medicines only as told by your doctor.  Do not use any tobacco products, including cigarettes, chewing tobacco, or electronic cigarettes. If you need help quitting, ask your doctor.  Lose weight if you are overweight, and maintain a healthy weight as told by your doctor.  Eat a diet that is low in fat and cholesterol. If you need help, ask your doctor.  Exercise regularly. Ask your doctor for some good activities for you.  Take good care of your feet.  Wear comfortable shoes that fit well.  Check your feet often for any cuts or sores. Contact a doctor if:  You have cramps in your legs while walking.  You have leg pain when you are at rest.  You have coldness in a leg or foot.  Your skin changes.  You are unable to get or have an erection (erectile dysfunction).  You have cuts or sores on your feet that are not healing. Get help right away if:  Your arm or leg turns cold and blue.  Your arms or legs become red, warm, swollen, painful, or numb.  You have  chest pain or trouble breathing.  You suddenly have weakness in your face, arm, or leg.  You become very confused or you cannot speak.  You suddenly have a very bad headache.  You suddenly cannot see. This information is not intended to replace advice given to you by your health care provider. Make sure you discuss any questions you have with your health care provider. Document Released: 03/25/2009 Document Revised: 06/06/2015 Document Reviewed: 06/08/2013 Elsevier Interactive Patient Education  2017 Elsevier Inc.      Steps to Quit Smoking Smoking tobacco can be bad for your health. It can also affect almost every organ in your body. Smoking puts you and people around you at risk for many serious long-lasting (chronic) diseases. Quitting smoking is hard, but it is one of the best things that you can do for your health. It is never too late to quit. What are the benefits of quitting smoking? When you quit smoking, you lower your risk for getting serious diseases and conditions. They can include:  Lung cancer or lung disease.  Heart disease.  Stroke.  Heart attack.  Not being able to have children (infertility).  Weak bones (osteoporosis) and broken bones (fractures). If you have coughing, wheezing, and shortness of breath, those symptoms may get better when you quit. You may also get sick less often. If you are pregnant, quitting smoking can help to lower your chances   of having a baby of low birth weight. What can I do to help me quit smoking? Talk with your doctor about what can help you quit smoking. Some things you can do (strategies) include:  Quitting smoking totally, instead of slowly cutting back how much you smoke over a period of time.  Going to in-person counseling. You are more likely to quit if you go to many counseling sessions.  Using resources and support systems, such as:  Online chats with a counselor.  Phone quitlines.  Printed self-help  materials.  Support groups or group counseling.  Text messaging programs.  Mobile phone apps or applications.  Taking medicines. Some of these medicines may have nicotine in them. If you are pregnant or breastfeeding, do not take any medicines to quit smoking unless your doctor says it is okay. Talk with your doctor about counseling or other things that can help you. Talk with your doctor about using more than one strategy at the same time, such as taking medicines while you are also going to in-person counseling. This can help make quitting easier. What things can I do to make it easier to quit? Quitting smoking might feel very hard at first, but there is a lot that you can do to make it easier. Take these steps:  Talk to your family and friends. Ask them to support and encourage you.  Call phone quitlines, reach out to support groups, or work with a counselor.  Ask people who smoke to not smoke around you.  Avoid places that make you want (trigger) to smoke, such as:  Bars.  Parties.  Smoke-break areas at work.  Spend time with people who do not smoke.  Lower the stress in your life. Stress can make you want to smoke. Try these things to help your stress:  Getting regular exercise.  Deep-breathing exercises.  Yoga.  Meditating.  Doing a body scan. To do this, close your eyes, focus on one area of your body at a time from head to toe, and notice which parts of your body are tense. Try to relax the muscles in those areas.  Download or buy apps on your mobile phone or tablet that can help you stick to your quit plan. There are many free apps, such as QuitGuide from the CDC (Centers for Disease Control and Prevention). You can find more support from smokefree.gov and other websites. This information is not intended to replace advice given to you by your health care provider. Make sure you discuss any questions you have with your health care provider. Document Released:  10/25/2008 Document Revised: 08/27/2015 Document Reviewed: 05/15/2014 Elsevier Interactive Patient Education  2017 Elsevier Inc.  
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
## 2016-01-23 NOTE — Addendum Note (Signed)
Addended by: Lianne Cure A on: 01/23/2016 12:01 PM   Modules accepted: Orders

## 2016-02-04 ENCOUNTER — Telehealth (HOSPITAL_COMMUNITY): Payer: Self-pay

## 2016-02-04 NOTE — Telephone Encounter (Signed)
Left message for pt to return call. AW 

## 2016-02-05 ENCOUNTER — Ambulatory Visit (HOSPITAL_COMMUNITY)
Admission: RE | Admit: 2016-02-05 | Discharge: 2016-02-05 | Disposition: A | Discharge status: Home / Self Care | Payer: Medicaid Other | Source: Ambulatory Visit | Attending: Family | Admitting: Family

## 2016-02-05 ENCOUNTER — Ambulatory Visit (INDEPENDENT_AMBULATORY_CARE_PROVIDER_SITE_OTHER)
Admission: RE | Admit: 2016-02-05 | Discharge: 2016-02-05 | Disposition: A | Discharge status: Home / Self Care | Payer: Medicaid Other | Source: Ambulatory Visit | Attending: Family | Admitting: Family

## 2016-02-05 DIAGNOSIS — F17200 Nicotine dependence, unspecified, uncomplicated: Secondary | ICD-10-CM

## 2016-02-05 DIAGNOSIS — I779 Disorder of arteries and arterioles, unspecified: Secondary | ICD-10-CM | POA: Diagnosis not present

## 2016-02-05 DIAGNOSIS — Z95828 Presence of other vascular implants and grafts: Secondary | ICD-10-CM | POA: Diagnosis not present

## 2016-02-05 DIAGNOSIS — I771 Stricture of artery: Secondary | ICD-10-CM | POA: Insufficient documentation

## 2016-02-07 ENCOUNTER — Encounter: Payer: Self-pay | Admitting: Gastroenterology

## 2016-02-12 ENCOUNTER — Ambulatory Visit (INDEPENDENT_AMBULATORY_CARE_PROVIDER_SITE_OTHER): Payer: Medicaid Other | Admitting: Internal Medicine

## 2016-02-12 ENCOUNTER — Encounter: Payer: Self-pay | Admitting: Cardiology

## 2016-02-12 ENCOUNTER — Other Ambulatory Visit (INDEPENDENT_AMBULATORY_CARE_PROVIDER_SITE_OTHER): Payer: Medicaid Other

## 2016-02-12 ENCOUNTER — Encounter: Payer: Self-pay | Admitting: Internal Medicine

## 2016-02-12 VITALS — BP 120/66 | HR 78 | Ht 71.5 in | Wt 178.2 lb

## 2016-02-12 DIAGNOSIS — K5909 Other constipation: Secondary | ICD-10-CM | POA: Diagnosis not present

## 2016-02-12 DIAGNOSIS — R7309 Other abnormal glucose: Secondary | ICD-10-CM | POA: Diagnosis not present

## 2016-02-12 DIAGNOSIS — I700 Atherosclerosis of aorta: Secondary | ICD-10-CM

## 2016-02-12 DIAGNOSIS — R1030 Lower abdominal pain, unspecified: Secondary | ICD-10-CM | POA: Diagnosis not present

## 2016-02-12 LAB — CBC WITH DIFFERENTIAL/PLATELET
BASOS ABS: 76 {cells}/uL (ref 0–200)
Basophils Relative: 1 %
EOS ABS: 532 {cells}/uL — AB (ref 15–500)
Eosinophils Relative: 7 %
HEMATOCRIT: 41.1 % (ref 38.5–50.0)
HEMOGLOBIN: 14.2 g/dL (ref 13.2–17.1)
LYMPHS ABS: 2888 {cells}/uL (ref 850–3900)
LYMPHS PCT: 38 %
MCH: 30.9 pg (ref 27.0–33.0)
MCHC: 34.5 g/dL (ref 32.0–36.0)
MCV: 89.5 fL (ref 80.0–100.0)
MONO ABS: 532 {cells}/uL (ref 200–950)
MPV: 10 fL (ref 7.5–12.5)
Monocytes Relative: 7 %
NEUTROS PCT: 47 %
Neutro Abs: 3572 cells/uL (ref 1500–7800)
Platelets: 283 10*3/uL (ref 140–400)
RBC: 4.59 MIL/uL (ref 4.20–5.80)
RDW: 14 % (ref 11.0–15.0)
WBC: 7.6 10*3/uL (ref 3.8–10.8)

## 2016-02-12 LAB — COMPREHENSIVE METABOLIC PANEL
ALBUMIN: 4.1 g/dL (ref 3.5–5.2)
ALT: 17 U/L (ref 0–53)
AST: 17 U/L (ref 0–37)
Alkaline Phosphatase: 68 U/L (ref 39–117)
BUN: 23 mg/dL (ref 6–23)
CHLORIDE: 108 meq/L (ref 96–112)
CO2: 27 meq/L (ref 19–32)
CREATININE: 0.97 mg/dL (ref 0.40–1.50)
Calcium: 9.2 mg/dL (ref 8.4–10.5)
GFR: 84.66 mL/min (ref >60.00)
Glucose, Bld: 96 mg/dL (ref 70–99)
POTASSIUM: 4.3 meq/L (ref 3.5–5.1)
SODIUM: 139 meq/L (ref 135–145)
Total Bilirubin: 0.3 mg/dL (ref 0.2–1.2)
Total Protein: 7.8 g/dL (ref 6.0–8.3)

## 2016-02-12 NOTE — Patient Instructions (Addendum)
  Your physician has requested that you go to the basement for the following lab work before leaving today: CBC/diff, CMET   You have been scheduled for a CT scan of the abdomen and pelvis at [facility] ([address]---this is in the same building as Press photographer).   You are scheduled on 02/18/16 at 10:30AM. You should arrive 15 minutes prior to your appointment time for registration. Please follow the written instructions below on the day of your exam:  WARNING: IF YOU ARE ALLERGIC TO IODINE/X-RAY DYE, PLEASE NOTIFY RADIOLOGY IMMEDIATELY AT [phone]! YOU WILL BE GIVEN A 13 HOUR PREMEDICATION PREP.  1) Do not eat or drink anything after 6:30AM (4 hours prior to your test) 2) You have been given 2 bottles of oral contrast to drink. The solution may taste   better if refrigerated, but do NOT add ice or any other liquid to this solution. [place] before drinking.    Drink 1 bottle of contrast @ 8:30AM (2 hours prior to your exam)  Drink 1 bottle of contrast @ 9:30AM (1 hour prior to your exam)  You may take any medications as prescribed with a small amount of water except for the following: Metformin, Glucophage, Glucovance, Avandamet, Riomet, Fortamet, Actoplus Met, Janumet, Glumetza or Metaglip. The above medications must be held the day of the exam AND 48 hours after the exam.  The purpose of you drinking the oral contrast is to aid in the visualization of your intestinal tract. The contrast solution may cause some diarrhea. Before your exam is started, you will be given a small amount of fluid to drink. Depending on your individual set of symptoms, you may also receive an intravenous injection of x-ray contrast/dye. Plan on being at [facility] for 30 minutes or longer, depending on the type of exam you are having performed.  This test typically takes 30-45 minutes to complete.  If you have any questions regarding your exam or if you need to reschedule, you may  call the CT department at [phone] between the hours of 8:00 am and 5:00 pm, Monday-Friday.  ________________________________________________________________________  I appreciate the opportunity to care for you. Silvano Rusk, MD, [facility]
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
## 2016-02-17 ENCOUNTER — Telehealth: Payer: Self-pay

## 2016-02-17 NOTE — Telephone Encounter (Signed)
Stacy from CT called and she has r/s'ed Erhardt to 02/21/16 at 10:30 AM.

## 2016-02-17 NOTE — Telephone Encounter (Signed)
-----   Message from [name], MD sent at 02/17/2016 12:43 PM EST ----- Regarding: RE: CT AB/Pel  I called and am scheduled to speak to med director tomorrow at 445 - best possible so I suggest we cancel it for now vs trying to wait for retroactive approval which is an option  Tricky with this man as he is illiterate  Have cced PJ  ----- Message ----- From: [place]: 02/17/2016  12:14 PM To: Ahmod Gillespie E [place], CMA, [name], MD Subject: CT AB/Pel                                      Hi Dr. Darnell Level! I have sent clinicals and talked to nurse to try to get auth for CT Ab/pel but have been unsuccessful.  Pt is scheduled in the morning.   Call [phone] Press option to speak to Medical Director Case number# [id number] I would really appreciate it! Thanks, Amy

## 2016-02-18 ENCOUNTER — Inpatient Hospital Stay: Admission: RE | Admit: 2016-02-18 | Payer: Self-pay | Source: Ambulatory Visit

## 2016-02-18 ENCOUNTER — Telehealth: Payer: Self-pay | Admitting: Internal Medicine

## 2016-02-18 NOTE — Telephone Encounter (Signed)
Spoke to Market researcher at [employer]  CT authorized - # is [id number]

## 2016-02-19 NOTE — Telephone Encounter (Signed)
Thank you :)

## 2016-02-19 NOTE — Telephone Encounter (Signed)
Ct has been approved , Amy aware.

## 2016-02-20 IMAGING — CR DG CHEST 2V
2 series · 2 of 2 positions shown · non-contrast
Comparison: CT 09/27/2013

CLINICAL DATA: Panic attack.  Shortness of Breath.

EXAM:
CHEST  2 VIEW

[w chest pa]
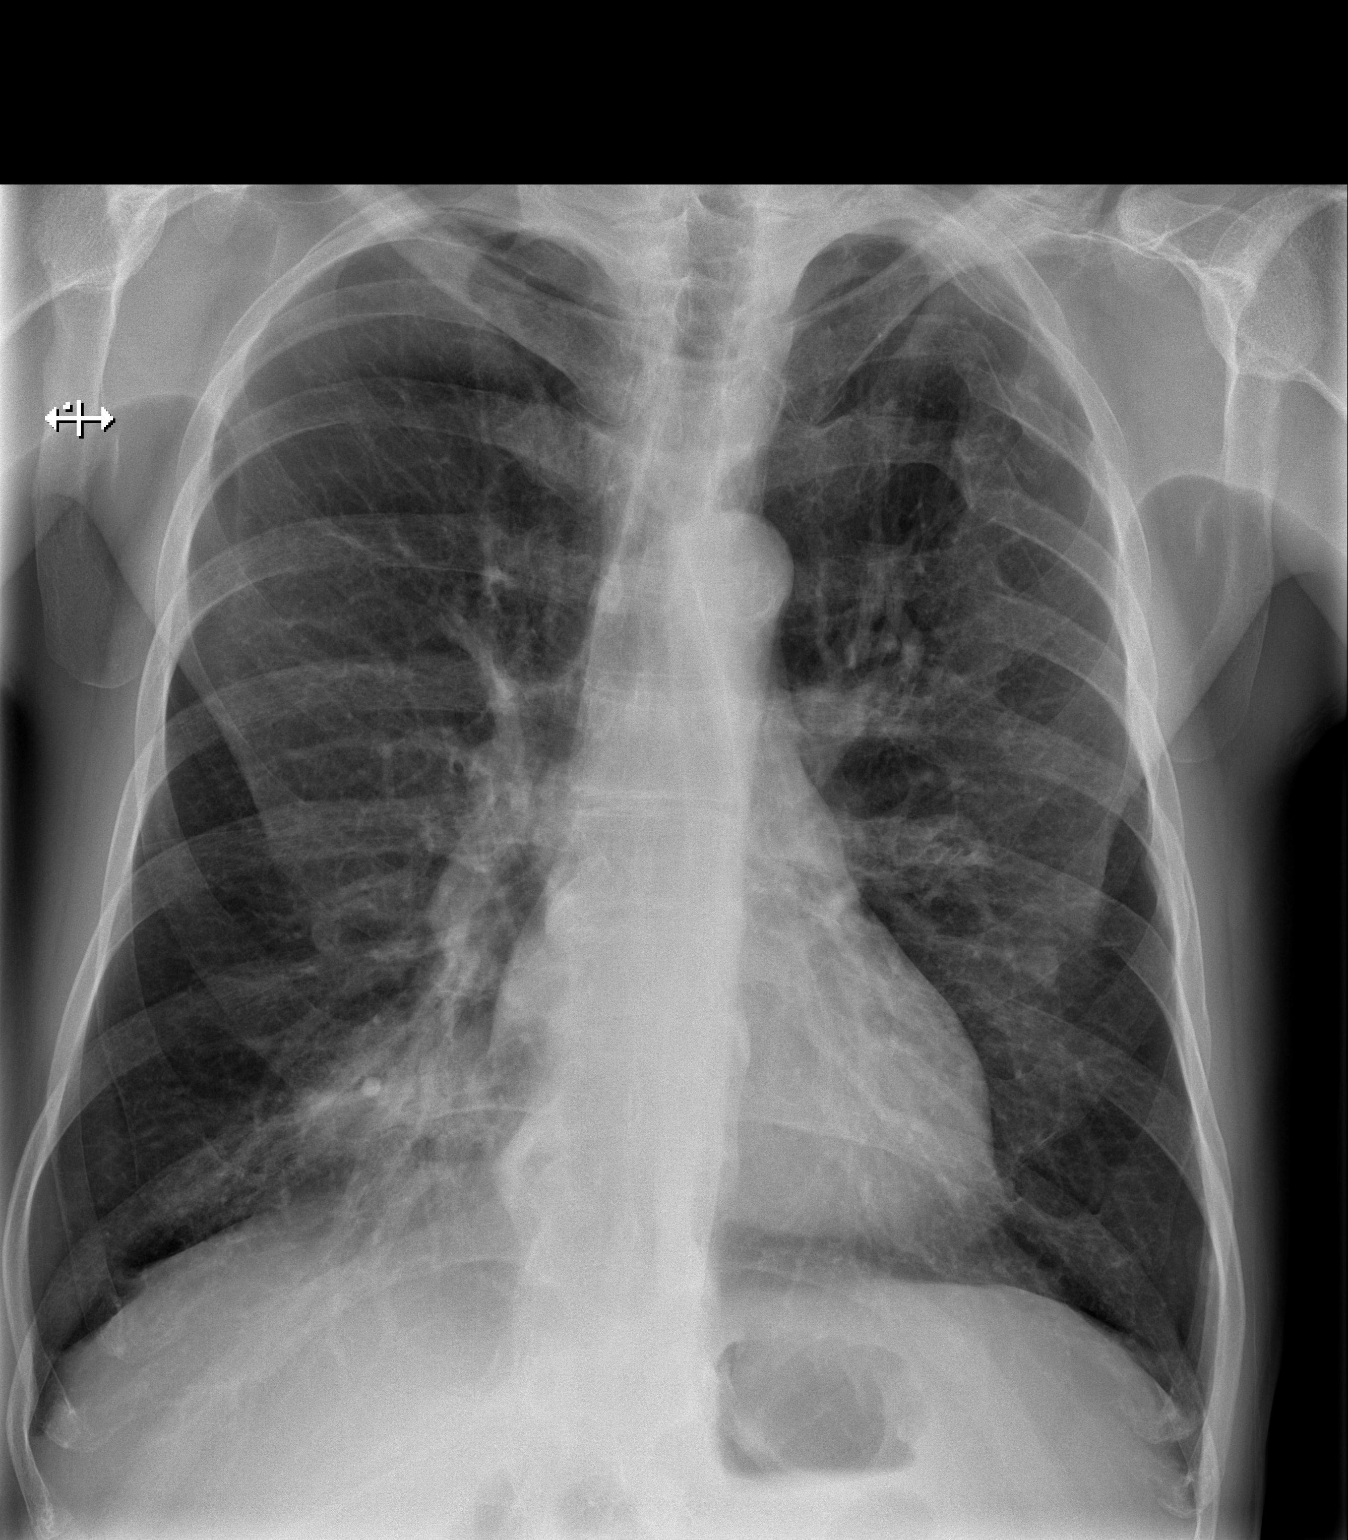

[w chest lat]
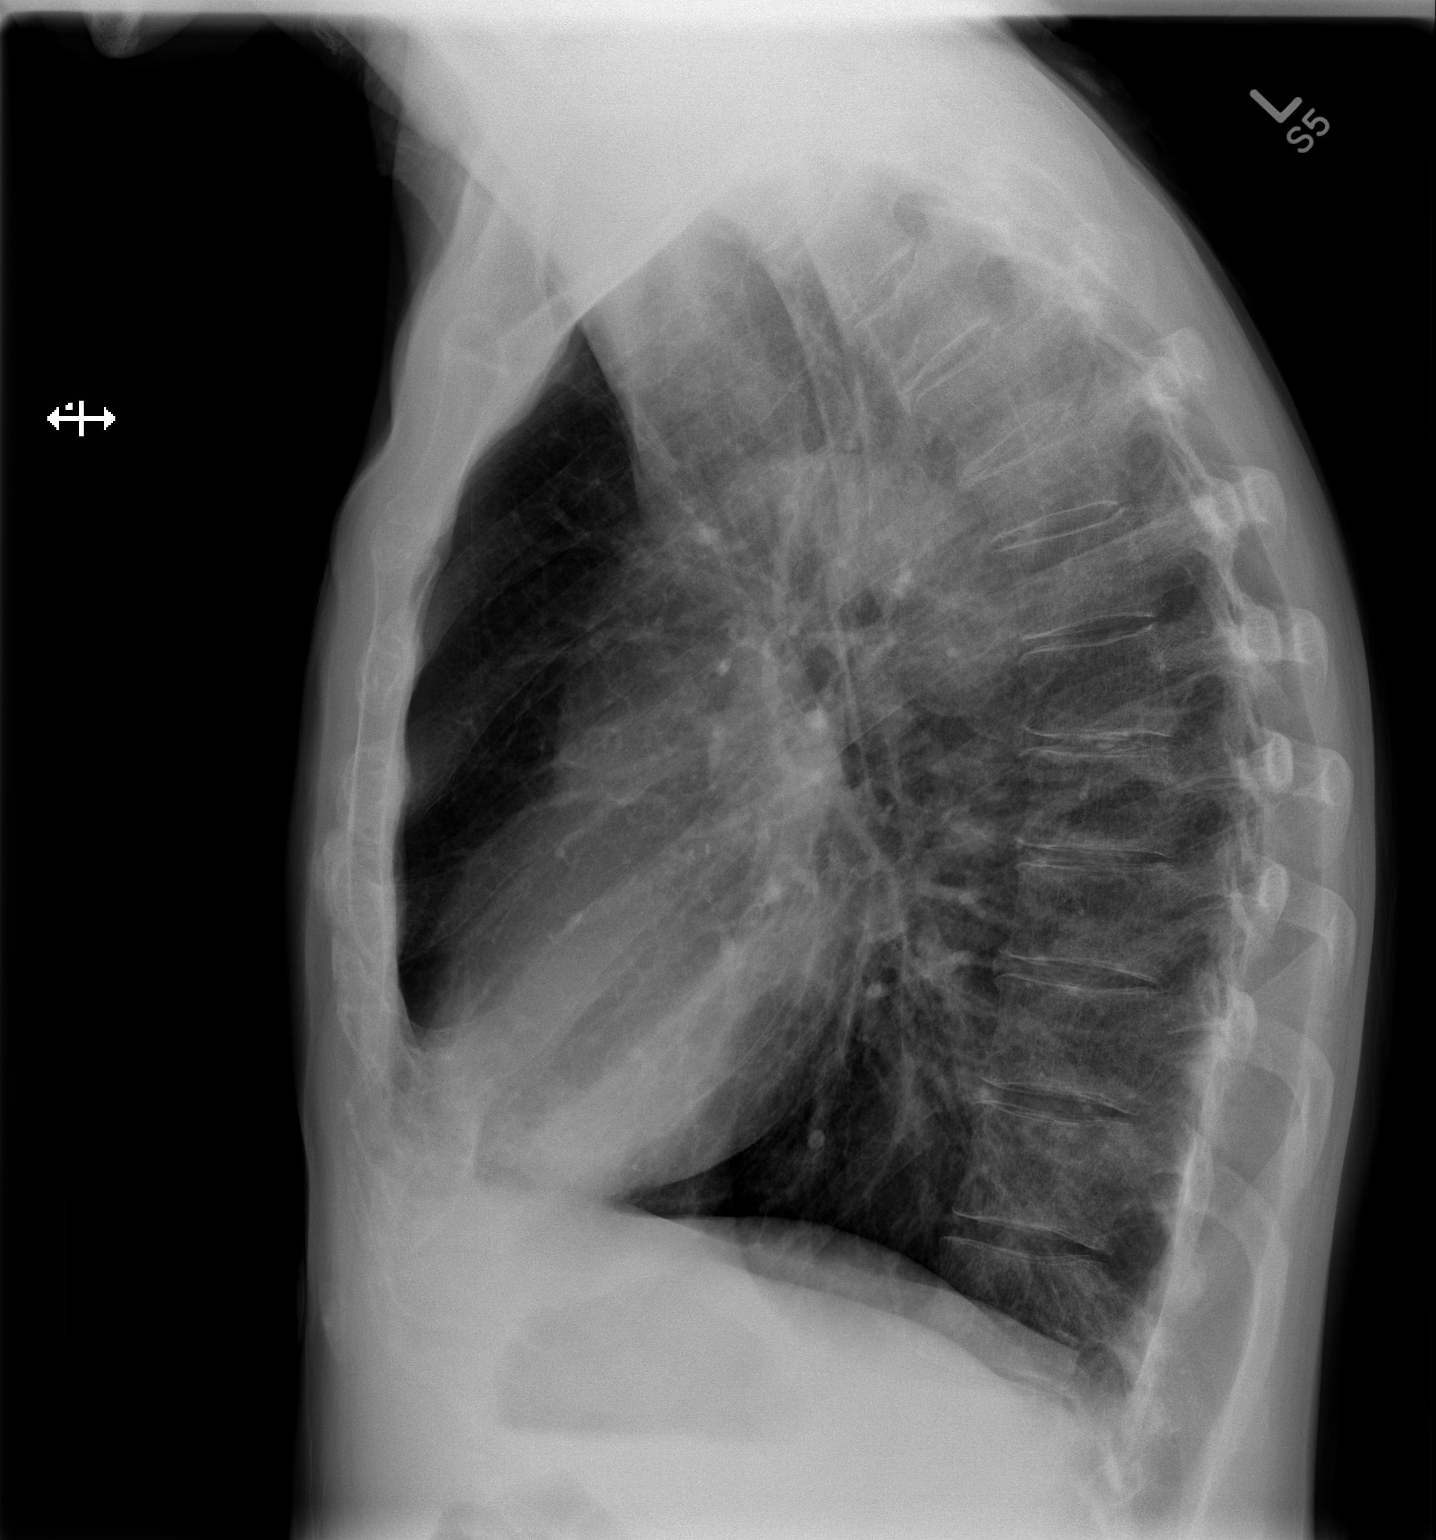

[2 of 2 positions shown; findings below may reference images not displayed]

FINDINGS: There is hyperinflation of the lungs compatible with COPD. Heart is
normal size. Left lung is clear. Airspace opacity noted in the right
medial lung base, likely right middle lobe. Cannot exclude
pneumonia. Airspace disease was noted in this area on prior CT but
may have worsened. No pleural effusions. No acute bony abnormality.
IMPRESSION: COPD.

Increasing right medial basilar airspace disease concerning for
worsening pneumonia.

## 2016-02-21 ENCOUNTER — Ambulatory Visit (INDEPENDENT_AMBULATORY_CARE_PROVIDER_SITE_OTHER)
Admission: RE | Admit: 2016-02-21 | Discharge: 2016-02-21 | Disposition: A | Discharge status: Home / Self Care | Payer: Medicaid Other | Source: Ambulatory Visit | Attending: Internal Medicine | Admitting: Internal Medicine

## 2016-02-21 DIAGNOSIS — R1030 Lower abdominal pain, unspecified: Secondary | ICD-10-CM | POA: Diagnosis not present

## 2016-02-21 DIAGNOSIS — I700 Atherosclerosis of aorta: Secondary | ICD-10-CM

## 2016-02-21 MED ORDER — IOPAMIDOL (ISOVUE-300) INJECTION 61%
100.0000 mL | Freq: Once | INTRAVENOUS | Status: AC | PRN
  Administered 2016-02-21: 100 mL via INTRAVENOUS

## 2016-02-21 NOTE — Progress Notes (Signed)
CT scan does not show any problems that are causing his abdominal pains  I think quite likely due to nerve damage throughout body I suggest he go back to Dr. Alyson Ingles and consider treatment for neuropathy to see if that helps Daily MiraLax can help chronic constipation  See GI prn  Please fax copy of this report and notes to PCP

## 2016-03-18 ENCOUNTER — Telehealth: Payer: Self-pay | Admitting: *Deleted

## 2016-03-18 NOTE — Telephone Encounter (Signed)
Patient called into Triage line regarding his Left hand pain. He says that this started when he came here on 02-09-16 for ABIs. He says that the pain has worsen with the colder weather. He has a history of rheumatoid arthritis per EPIC notes. I have asked him to contact his PCP,Dr. Alyson Ingles for an evaluation of this hand pain. I ran his recent Database;   03/13/2016 OXYCODON- ACETAMINOPHEN 7.5- 325 #90 TID per  Dr. [facility] [place], [place]   02/17/2016 OXYCODON- ACETAMINOPHEN 7.5- 325 # 90 TID per  [facility] Genelle Gather MD [place], [place], [place]   01/16/2016 OXYCODON- ACETAMINOPHEN 7.5- 325 #90 TID per [facility] Genelle Gather MD [place], [place],    [name] voiced agreement of this plan and he will call Dr. Alyson Ingles for evaluation.

## 2016-03-23 ENCOUNTER — Ambulatory Visit: Payer: Self-pay | Admitting: Family

## 2016-03-25 IMAGING — CR DG CHEST 2V
3 series · 3 of 3 positions shown · non-contrast
Comparison: 10/27/2013

CLINICAL DATA: Fall.  Cough.

EXAM:
CHEST  2 VIEW

[w chest lat]
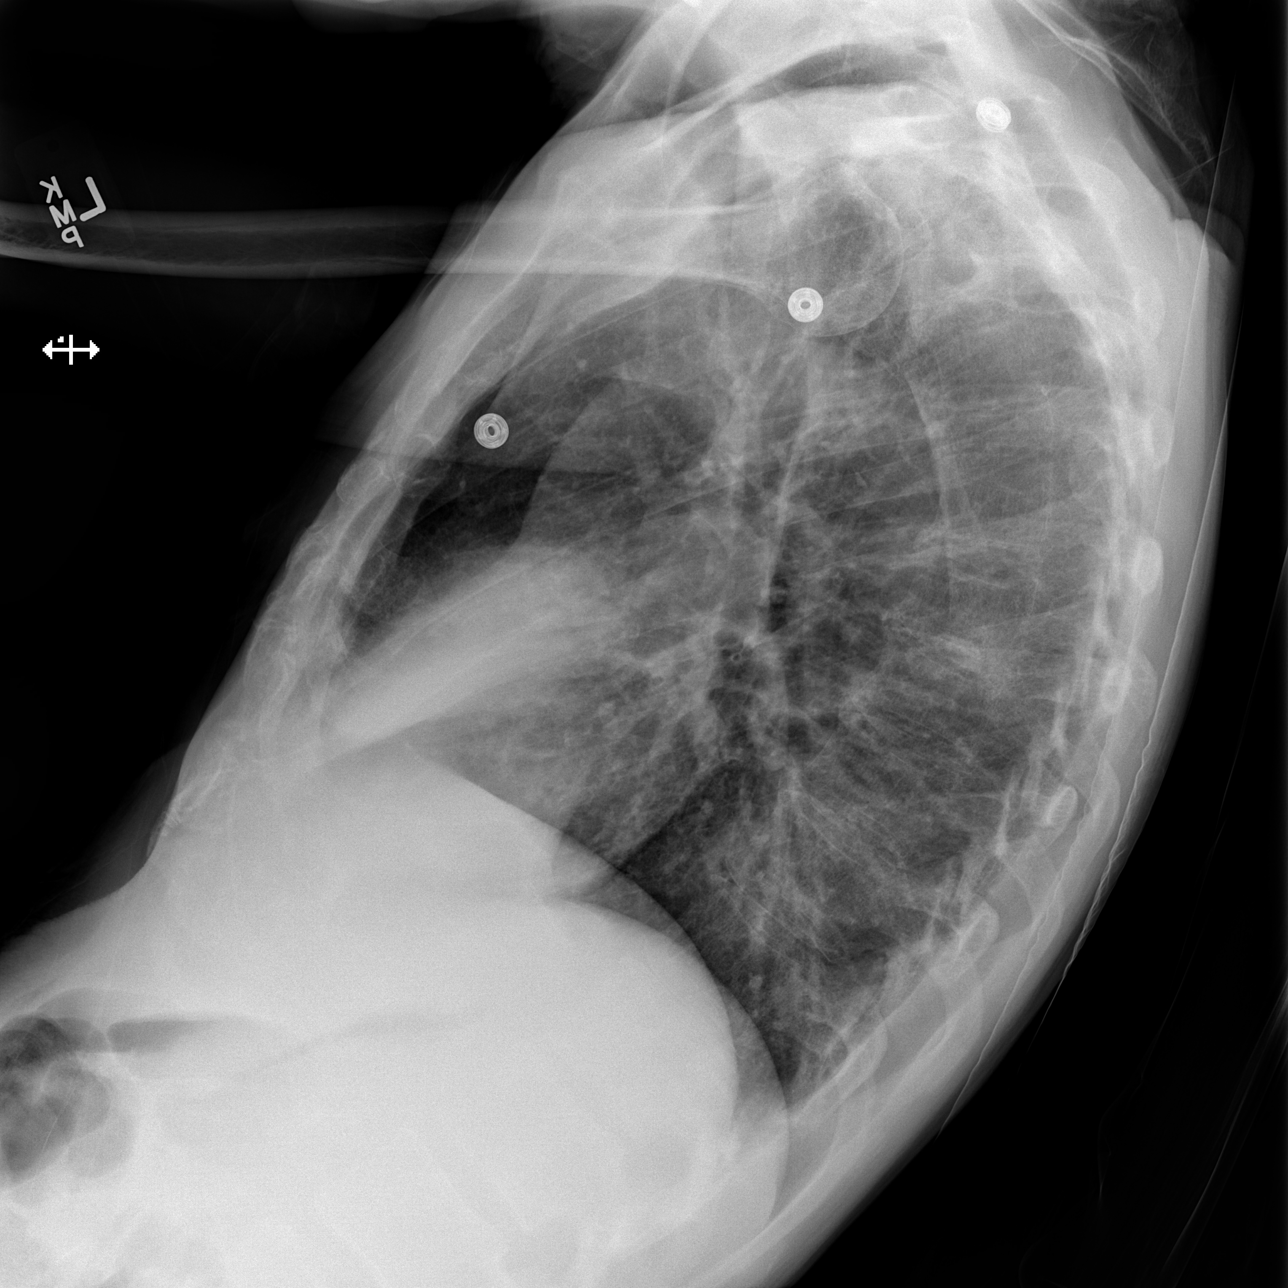

[x chest ap (1 of 2)]
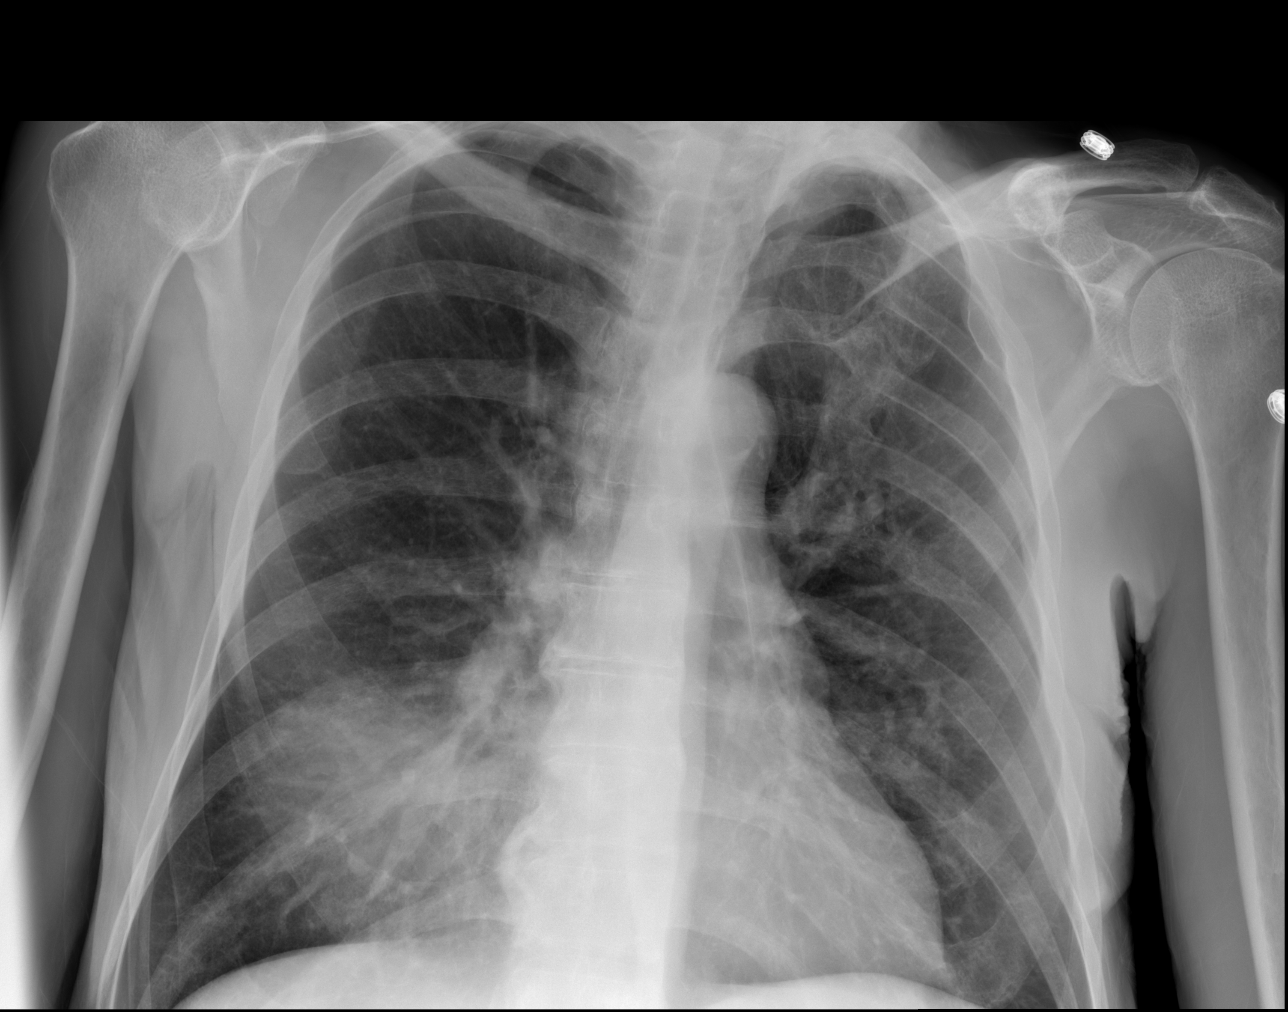

[x chest ap (2 of 2)]
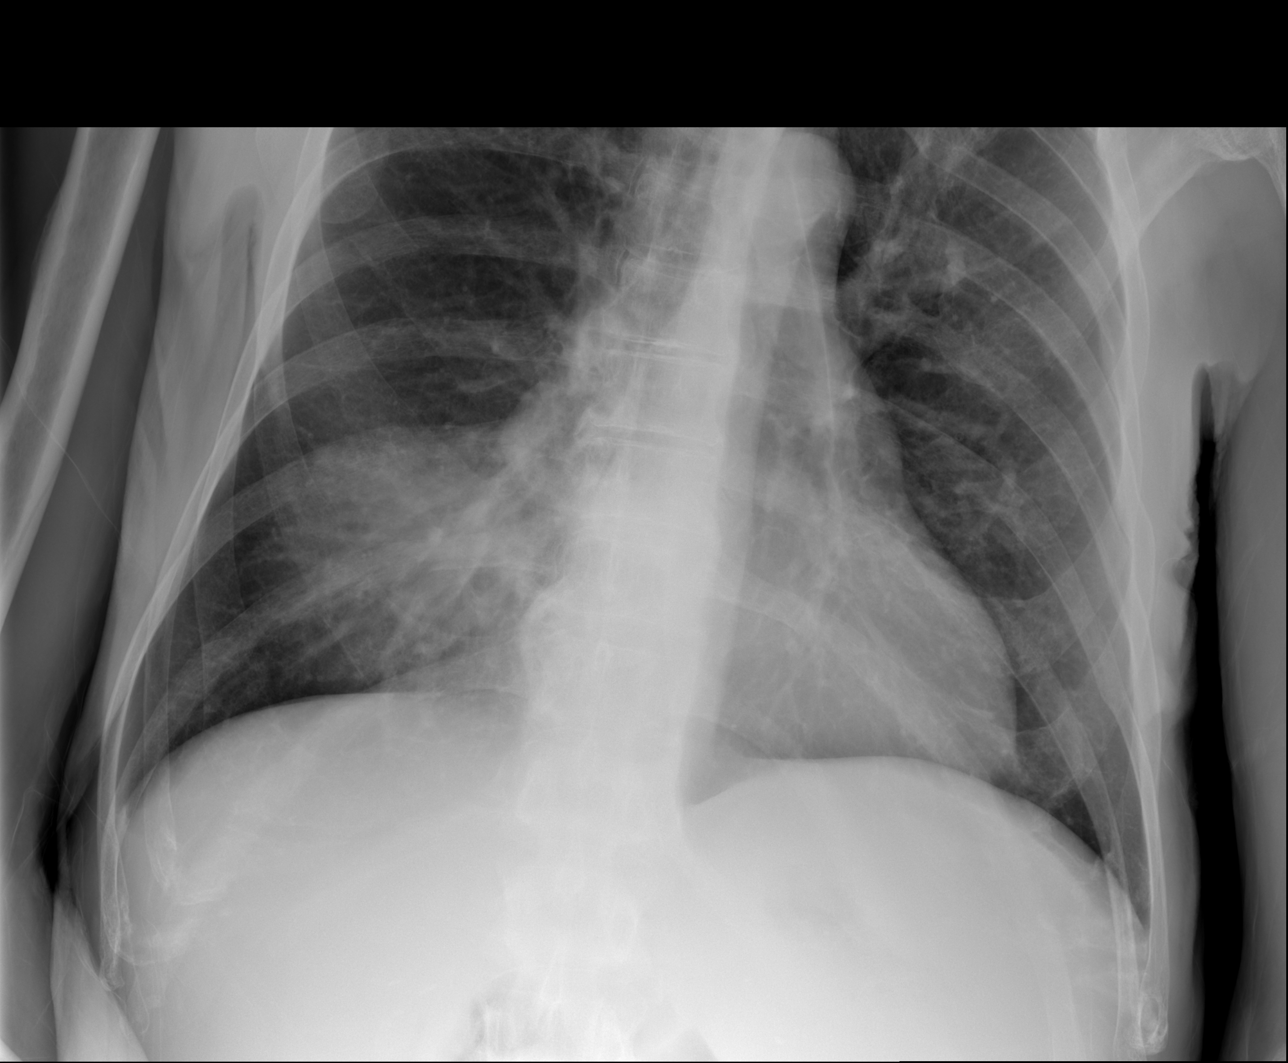

[3 of 3 positions shown; findings below may reference images not displayed]

FINDINGS: Persistent opacity in the right lung base appears to be increasing
in size since previous study. This may represent progression of
pneumonia. Follow-up until resolution is recommended to exclude
underlying obstructing lesion. Normal heart size and pulmonary
vascularity. No blunting of costophrenic angles. No pneumothorax.
Old fracture deformities of left upper ribs. Postoperative changes
in the cervical spine. Degenerative changes in the thoracic spine.
IMPRESSION: Persistent right middle lung pneumonia. Follow-up until resolution
is recommended. No new abnormalities since prior study.
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
## 2016-03-26 IMAGING — MR MR HEAD WO/W CM
9 of 13 series · 31 of 48 positions shown · IV contrast (multihance)
Comparison: Head CT 11/30/2013 and MRI 10/21/2011

CLINICAL DATA: Worsening lower extremity pain and edema with falls
over the past 2 days. Left frontal head injury with 1 of the falls.
Weight loss. IV drug abuse.

EXAM:
MRI HEAD WITHOUT AND WITH CONTRAST
TECHNIQUE: Multiplanar, multiecho pulse sequences of the brain and surrounding
structures were obtained without and with intravenous contrast.
CONTRAST:  12mL MULTIHANCE GADOBENATE DIMEGLUMINE 529 MG/ML IV SOLN

[Series 4: DWI · axial · 5.0mm · 1.09mm/px · z∈[-68,+81]mm · 6 of 62 slices shown (1 of 4)]
[im 1/62]
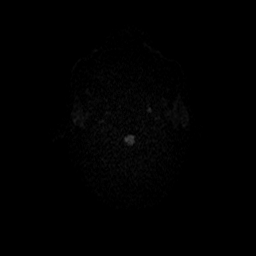
[im 13/62]
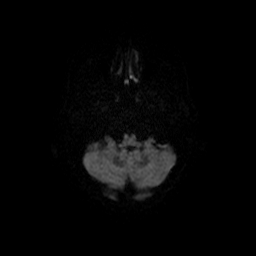
[im 25/62]
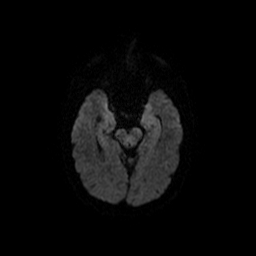
[im 37/62]
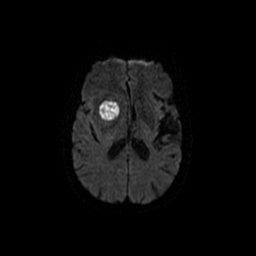
[im 49/62]
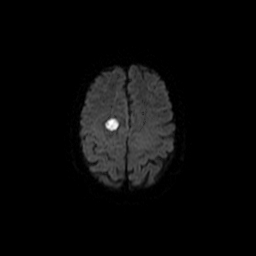
[im 62/62]
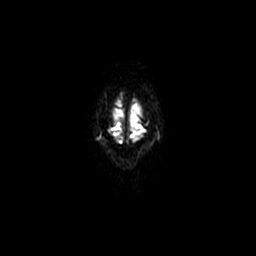

[Series 5: T2 · axial · 5.0mm · 0.43mm/px · z∈[-73,+75]mm · 3 of 26 slices shown]
[im 1/26]
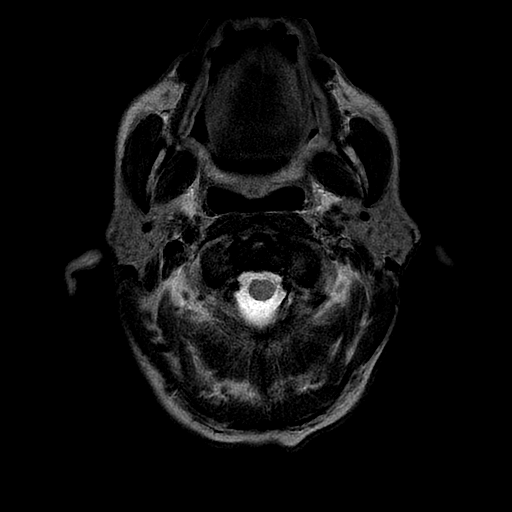
[im 13/26]
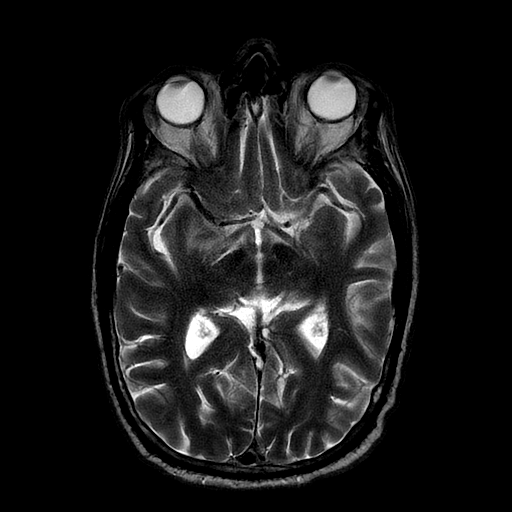
[im 26/26]
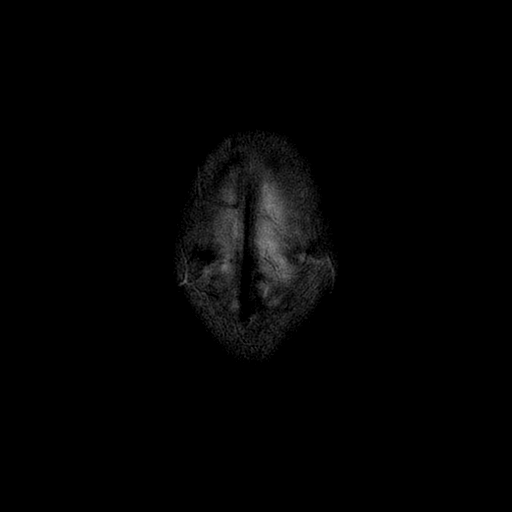

[Series 8: FLAIR · axial · 5.0mm · 0.43mm/px · z∈[-68,+79]mm · 3 of 26 slices shown (1 of 4)]
[im 1/26]
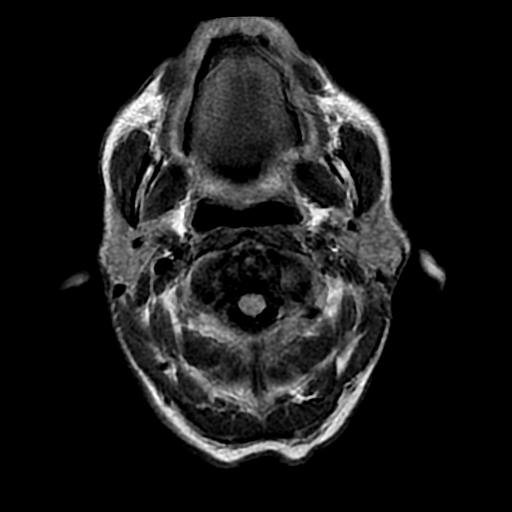
[im 13/26]
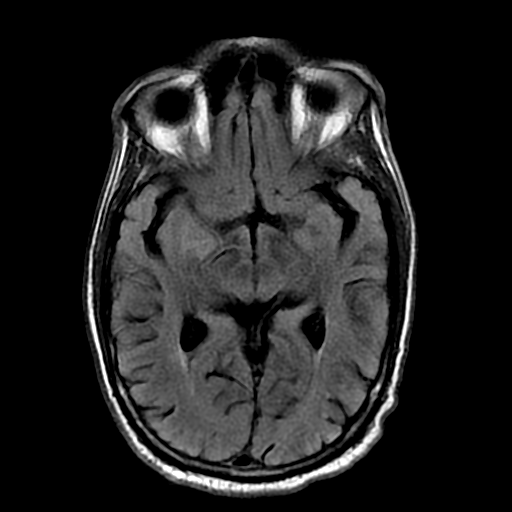
[im 26/26]
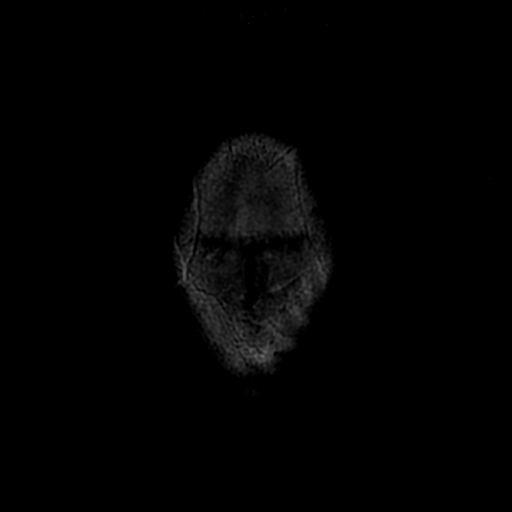

[Series 9: DWI · coronal · 5.0mm · 1.09mm/px · 6 of 66 slices shown (2 of 4)]
[im 1/66]
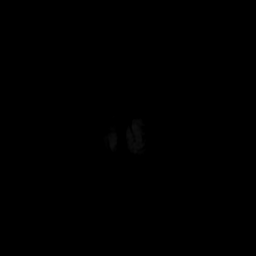
[im 14/66]
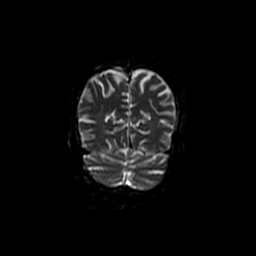
[im 27/66]
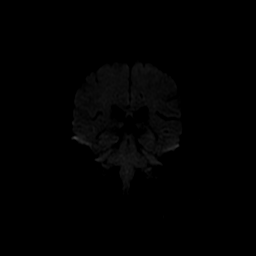
[im 40/66]
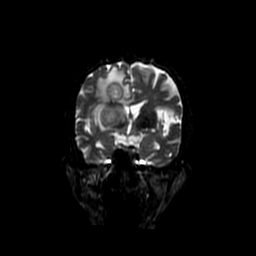
[im 53/66]
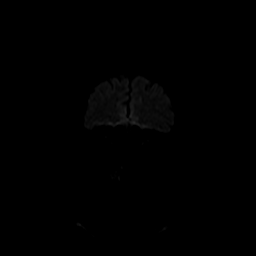
[im 66/66]
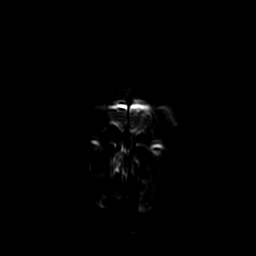

[Series 14: FLAIR · axial · 5.0mm · 0.43mm/px · z∈[-65,+77]mm · 2 of 25 slices shown (2 of 4)]
[im 1/25]
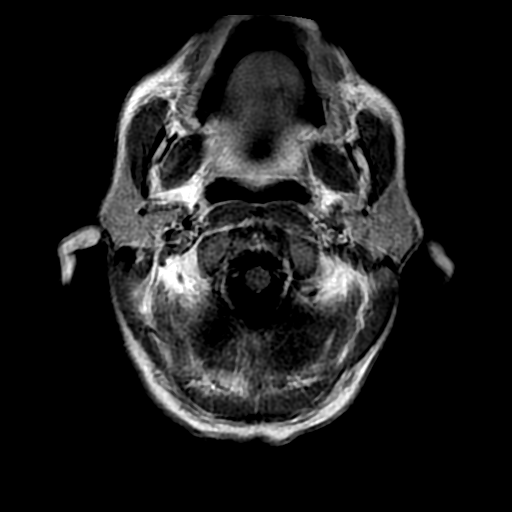
[im 25/25]
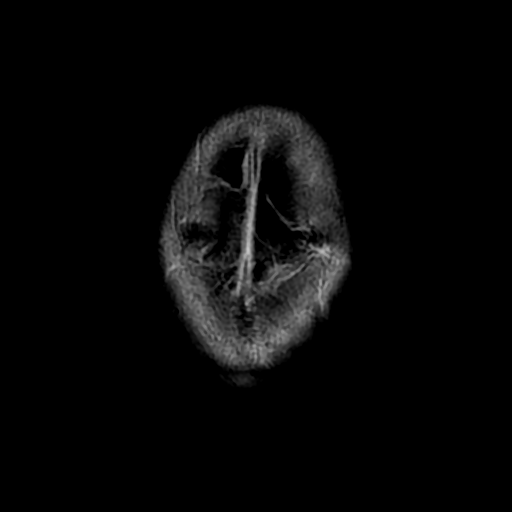

[Series 15: FLAIR · coronal · 5.0mm · 0.47mm/px · 3 of 27 slices shown (3 of 4)]
[im 1/27]
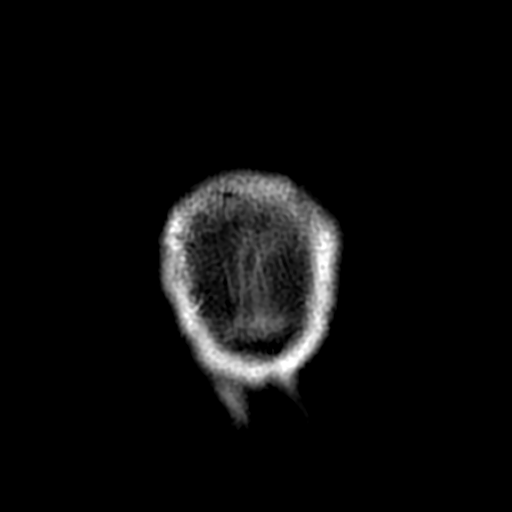
[im 14/27]
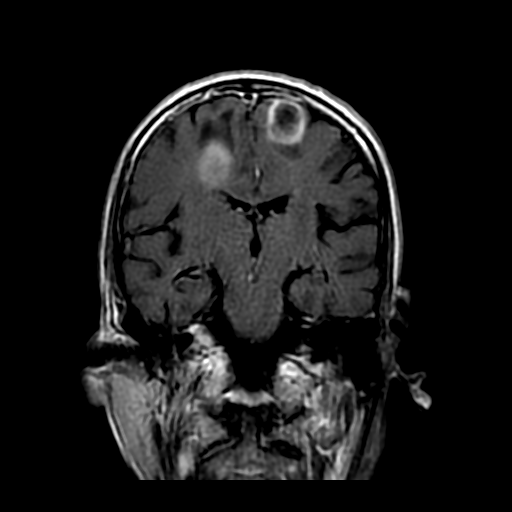
[im 27/27]
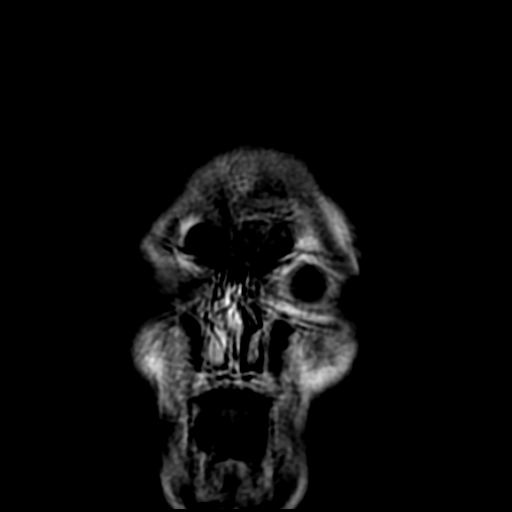

[Series 16: FLAIR · sagittal · 5.0mm · 0.47mm/px · 2 of 20 slices shown (4 of 4)]
[im 1/20]
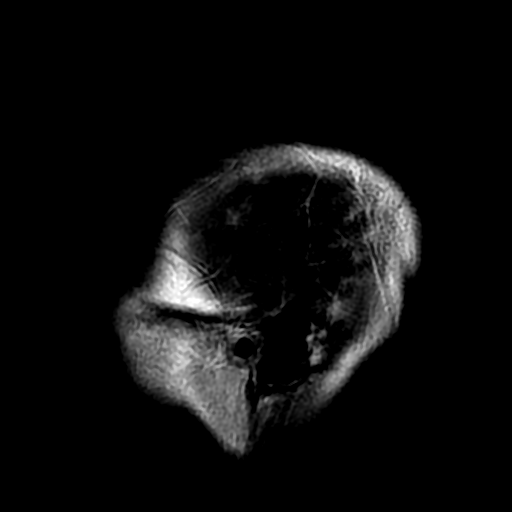
[im 20/20]
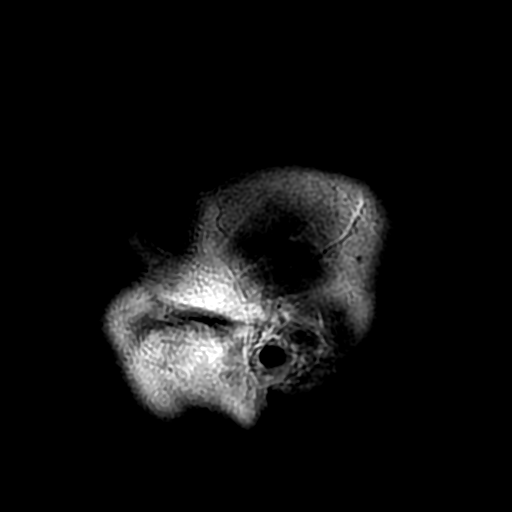

[Series 400: DWI · axial · 5.0mm · 1.09mm/px · z∈[-68,+81]mm · 3 of 31 slices shown (3 of 4)]
[im 1/31]
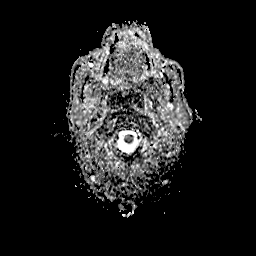
[im 16/31]
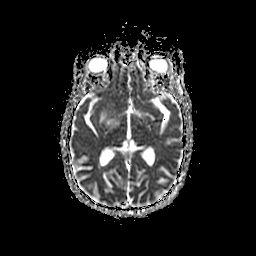
[im 31/31]
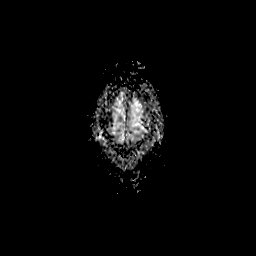

[Series 900: DWI · coronal · 5.0mm · 1.09mm/px · 3 of 33 slices shown (4 of 4)]
[im 1/33]
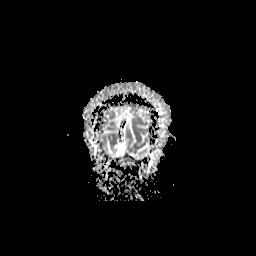
[im 17/33]
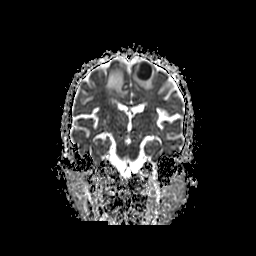
[im 33/33]
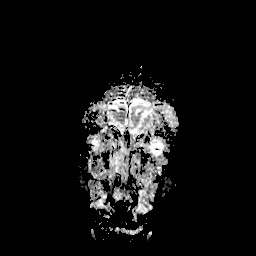

[31 of 48 positions shown; findings below may reference images not displayed]

FINDINGS: There is no evidence of acute infarct or extra-axial fluid
collection. Ring-enhancing masses demonstrate restricted diffusion
and measured 2.4 x 2.1 cm in the posterior left frontal lobe, 2.3 x
2.1 cm in the right frontal centrum semiovale, and 2.7 x 2.6 cm at
the level of the right basal ganglia. A small amount of
susceptibility artifact within these lesions, most prominently in
the right basal ganglia lesion, is compatible with a small amount of
associated blood products.

There is mild to moderate vasogenic edema surrounding these lesions.
Minimal leftward midline shift measures 3-4 mm. There is partial
effacement of the frontal horn of the right lateral ventricle. There
is no evidence of hydrocephalus. Chronic bilateral basal ganglia
lacunar infarcts are again noted. Chronic cortical infarct involving
the left insula and left frontal operculum is again seen. There is
mild generalized cerebral atrophy. Scattered, small foci of T2
hyperintensity in the cerebral white matter are nonspecific but may
reflect mild chronic small vessel ischemic disease.

Orbits are unremarkable. Paranasal sinuses and mastoid air cells are
clear. Chronically occluded left internal carotid artery is again
noted.
IMPRESSION: Three ring-enhancing masses with mild to moderate surrounding edema,
highly concerning for cerebral abscesses. Necrotic metastases are an
additional but less favored consideration.

Critical Value/emergent results were called by telephone at the time
of interpretation on 12/01/2013 at [DATE] to Dr. Cilli, who
verbally acknowledged these results.

## 2016-03-26 IMAGING — CT CT CHEST W/ CM
2 of 5 series · 7 of 36 positions shown, 8 images · IV contrast (Iodine)
Comparison: CT scan of chest September 27, 2013. CT scan of head
November 30, 2013.

CLINICAL DATA: Malignancy.  Possible intracranial metastases.

EXAM:
CT CHEST, ABDOMEN, AND PELVIS WITH CONTRAST
TECHNIQUE: Multidetector CT imaging of the chest, abdomen and pelvis was
performed following the standard protocol during bolus
administration of intravenous contrast.
CONTRAST:  100mL OMNIPAQUE IOHEXOL 300 MG/ML  SOLN

[Series 201: cap with, idose (2) · axial · 0.58mm/px · z∈[+111,+626]mm · 4 of 139 slices shown, 5 images]
[im 18/139  mediastinal]
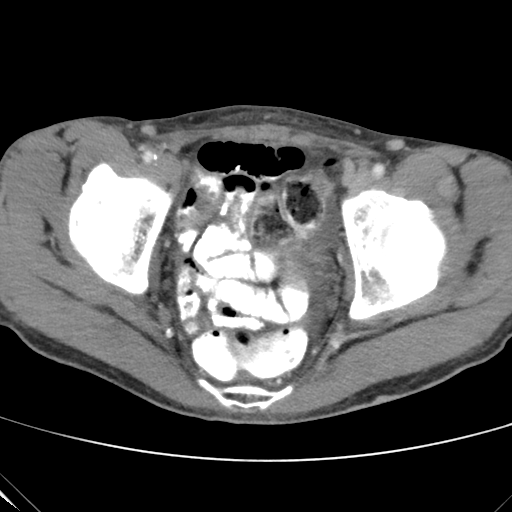
[im 18/139  lung]
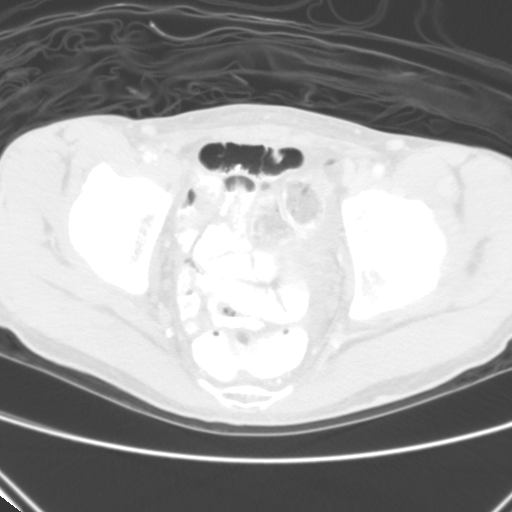
[im 52/139  lung]
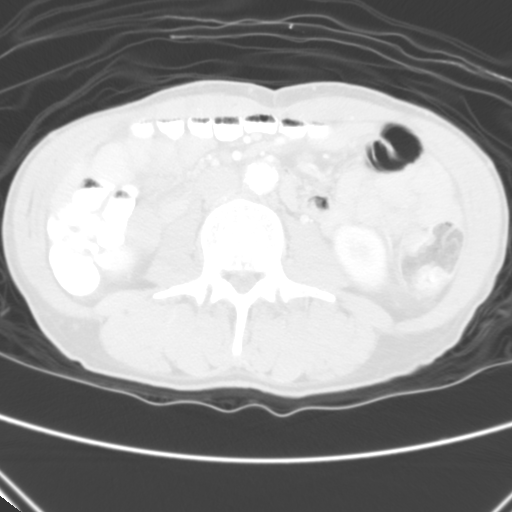
[im 87/139  lung]
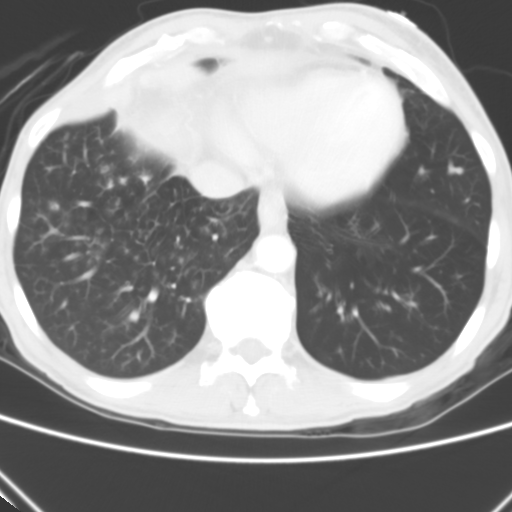
[im 121/139  lung]
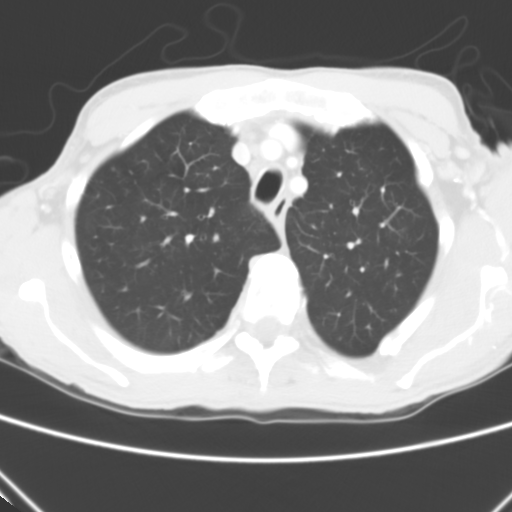

[Series 203: coronals, idose (2) · coronal · 0.45mm/px · 3 of 90 slices shown]
[im 18/90  lung]
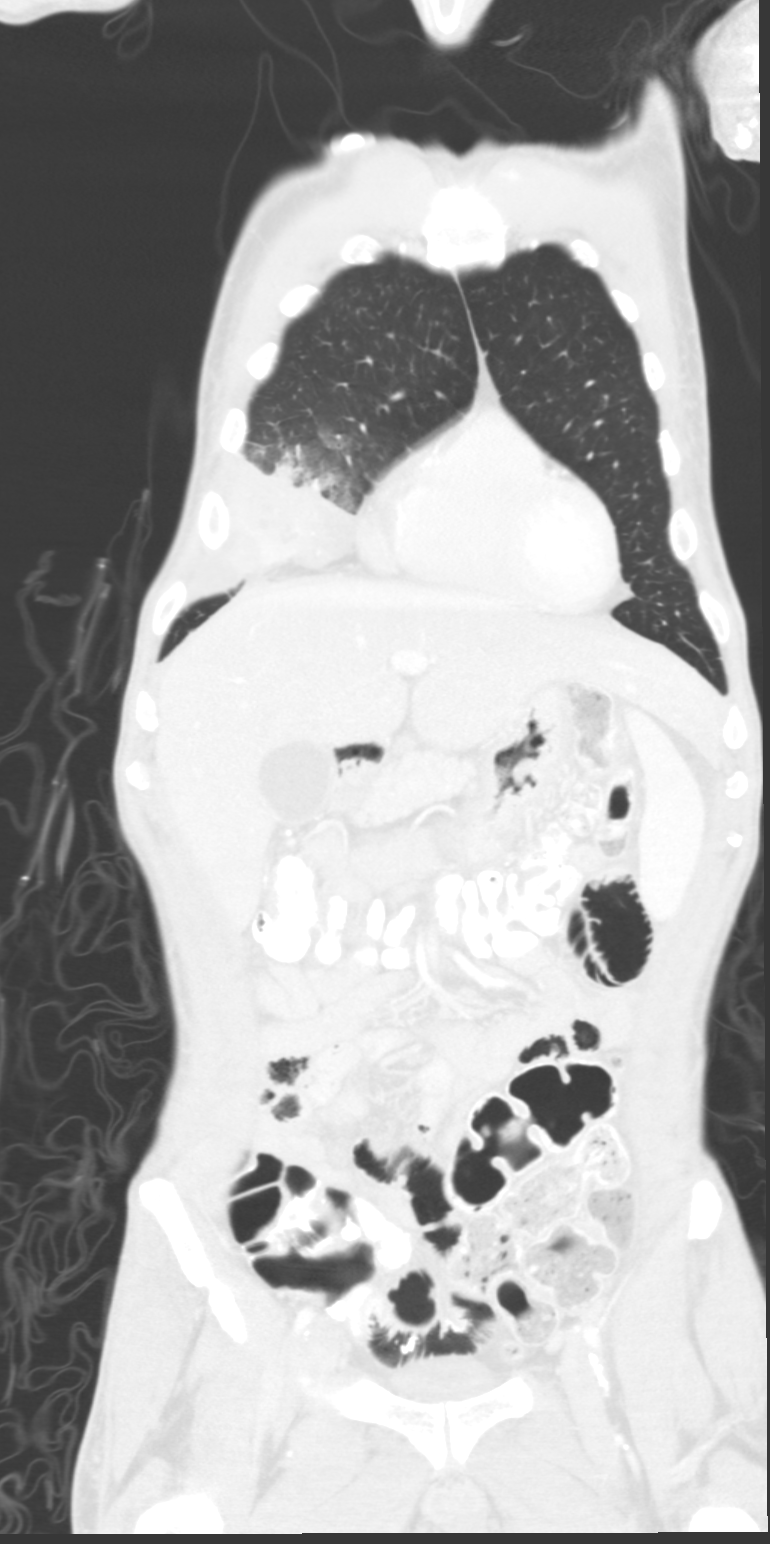
[im 36/90  lung]
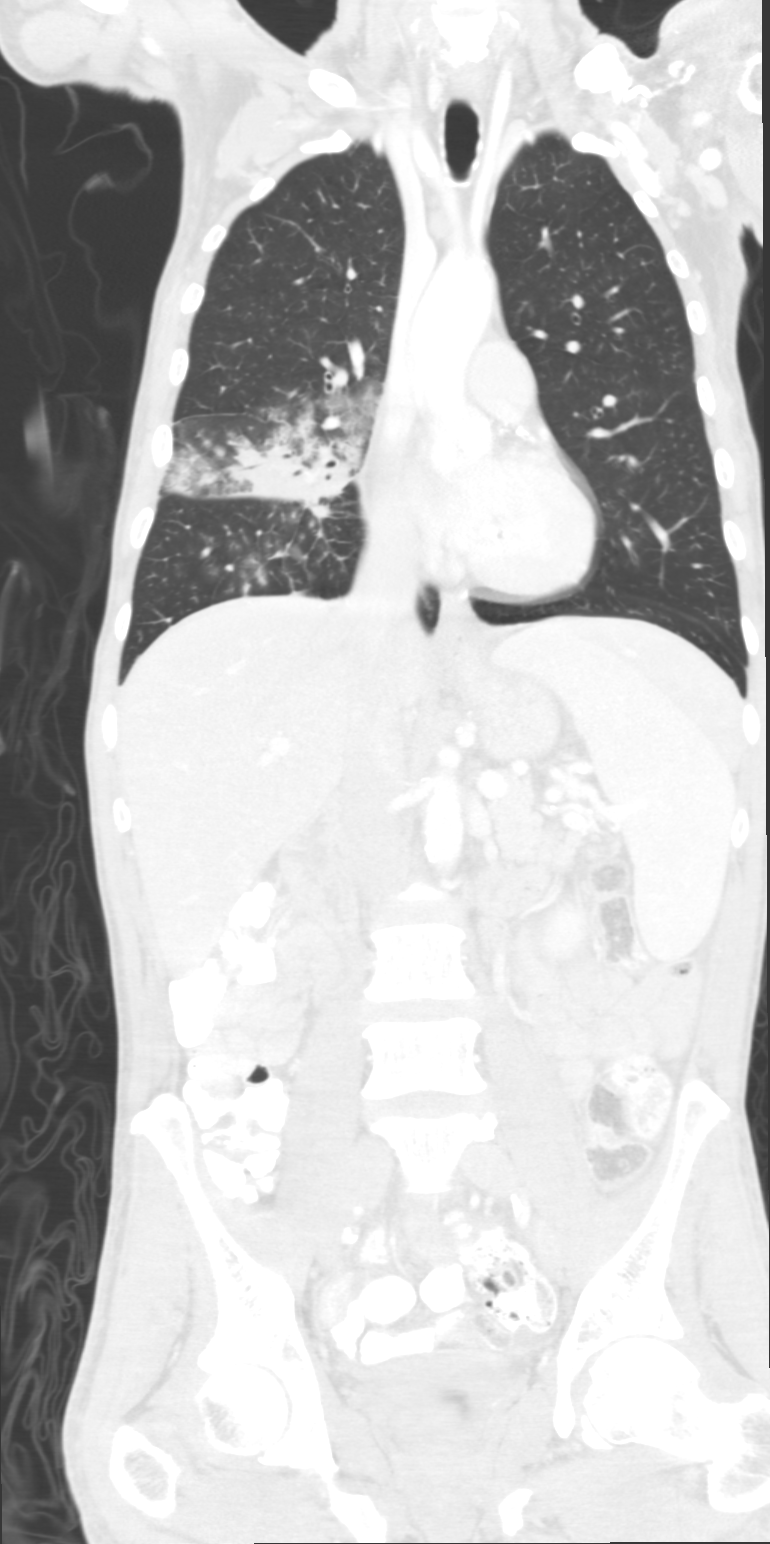
[im 54/90  lung]
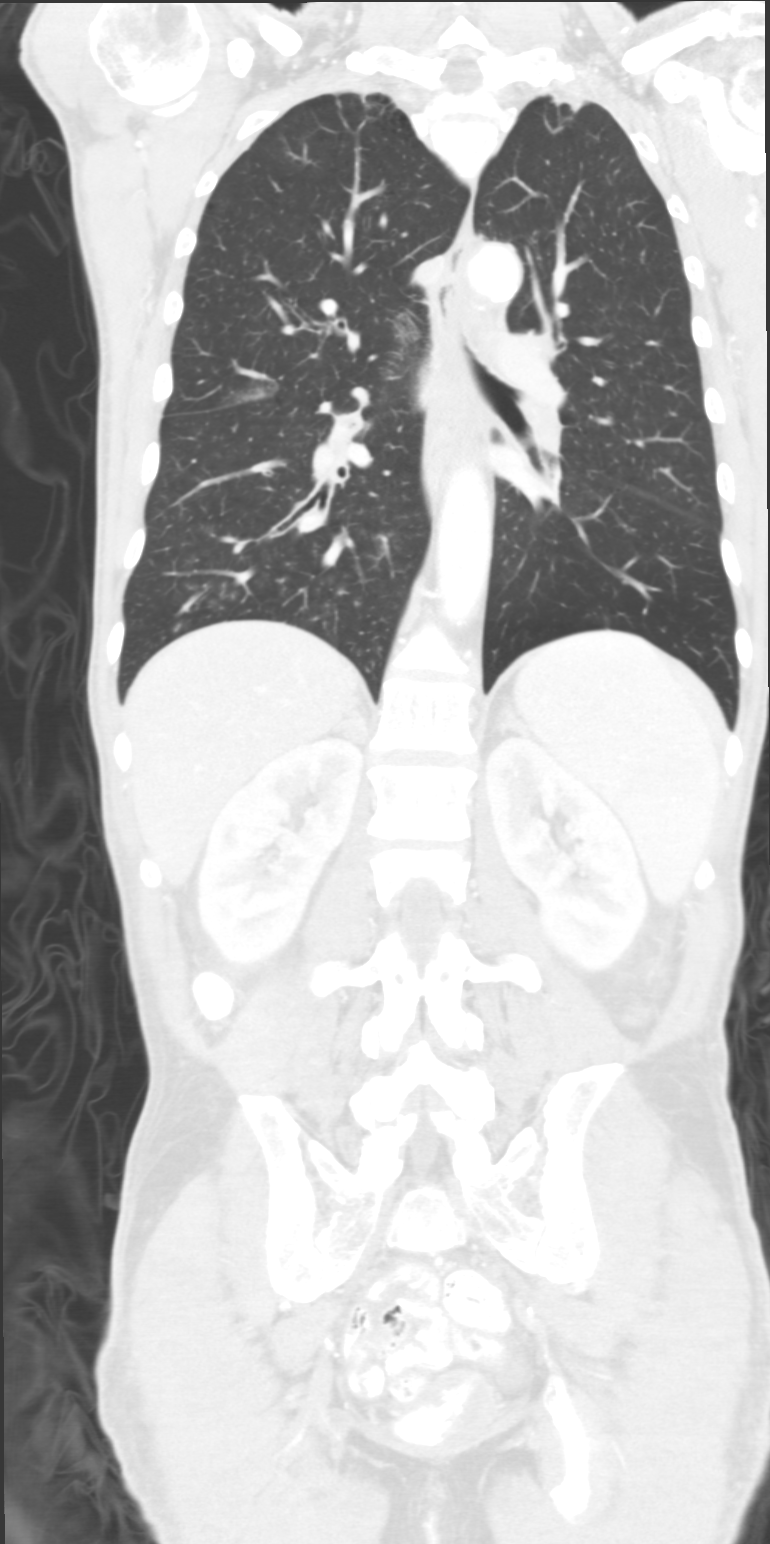

[7 of 36 positions shown; findings below may reference images not displayed]

FINDINGS: CT CHEST FINDINGS

No pneumothorax or pleural effusion is noted. Airspace opacity is
noted in the right middle lobe with air bronchograms most consistent
with pneumonia. 28 x 14 mm fluid collection is noted within this
area of consolidation containing air bubbles consistent with
cavitating pneumonia. 3.6 mm nodule is noted in the left upper lobe
best seen on image number 18 of series 2. Several healed nodular
densities are noted in the right lower lobe most likely representing
inflammation or infection. Subcarinal adenopathy measuring 2.4 x
cm is noted. Pretracheal lymph node measuring 15 x 10 mm is noted.
Coronary artery calcifications are noted. Thoracic aorta appears
normal. Multiple old left upper rib fractures are noted.

CT ABDOMEN AND PELVIS FINDINGS

No gallstones are noted. Mild splenomegaly is noted. The liver and
pancreas appear normal. Adrenal glands and kidneys appear normal.
Vascular calcifications are noted involving both kidneys. No
hydronephrosis or renal obstruction is noted. No definite renal or
ureteral calculi are noted. Atherosclerotic calcifications of
abdominal aorta and iliac arteries are noted without aneurysm
formation. There is no evidence of bowel obstruction. No abnormal
fluid collection is noted in the abdomen or pelvis. No significant
adenopathy is noted in the abdomen or pelvis.

There is a focal area of narrowing involving the rectum concerning
for "apple-core" lesion and malignancy.
IMPRESSION: Mild splenomegaly.

Focal area of narrowing and wall thickening seen in the rectum
concerning for possible "apple core" lesion and malignancy.
Sigmoidoscopy is recommended for further evaluation.

Enlarged pretracheal and subcarinal adenopathy is noted. It is
uncertain if this cysts inflammatory or metastatic in origin.

Area of consolidation is noted in right middle lobe with associated
2.8 cm fluid collection centrally within this abnormality concerning
for cavitating pneumonia. Also noted are multiple ill-defined
nodular densities in the right lower lobe most consistent with
inflammation or pneumonia. Followup CT scan in 2-3 weeks is
recommended to ensure resolution an rule out metastatic disease.

3.6 cm well-defined nodule is noted in the left upper lobe.
Potentially this may represent metastatic disease if the patient
does have primary malignancy, and continued CT follow-up is
recommended.

## 2016-03-29 IMAGING — CT CT HEAD W/O CM
1 of 2 series · 16 of 30 positions shown, 20 images · non-contrast
Comparison: MRI dated 12/01/2013 and is CT scan dated 11/30/2013

CLINICAL DATA: Altered mental status.  Multiple brain lesions.

EXAM:
CT HEAD WITHOUT CONTRAST
TECHNIQUE: Contiguous axial images were obtained from the base of the skull
through the vertex without intravenous contrast.

[Series 3: head 2.0 h70h · axial · 0.45mm/px · z∈[-150,+6]mm · 16 of 88 slices shown, 20 images]
[im 5/88  brain]
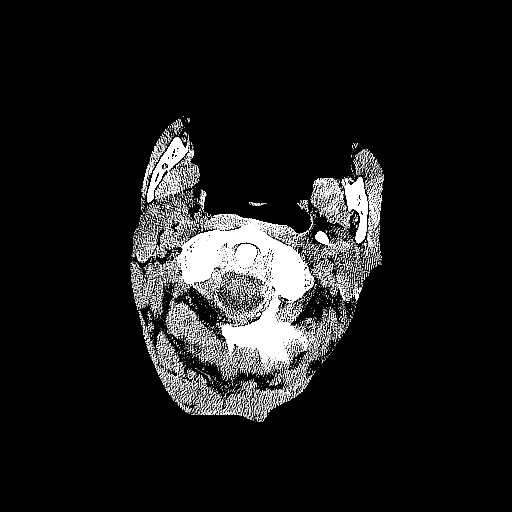
[im 5/88  bone]
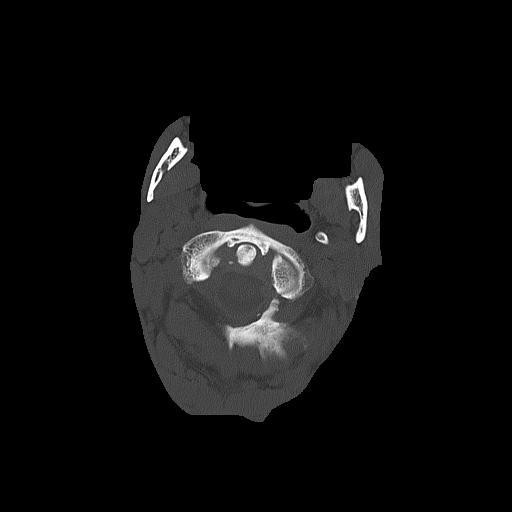
[im 9/88  brain]
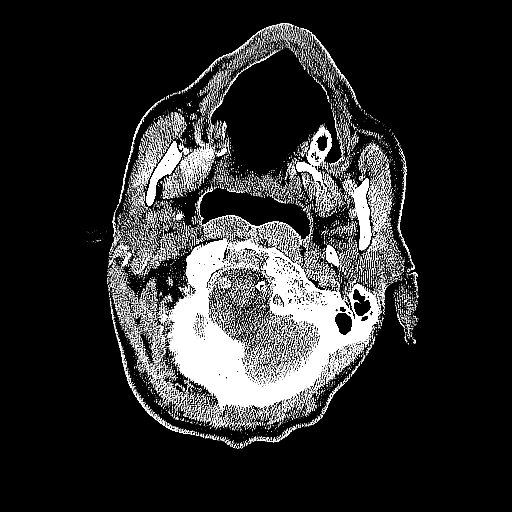
[im 14/88  brain]
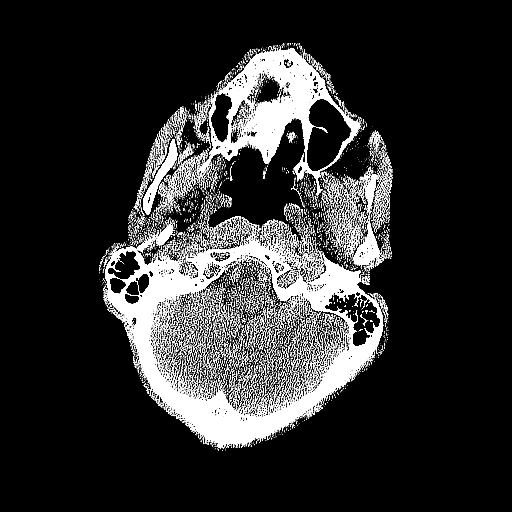
[im 22/88  brain]
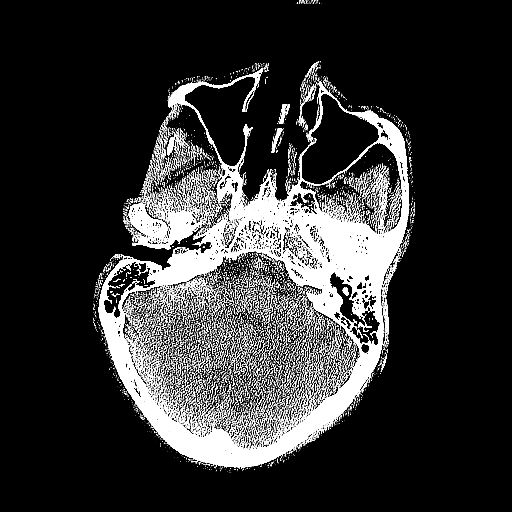
[im 27/88  brain]
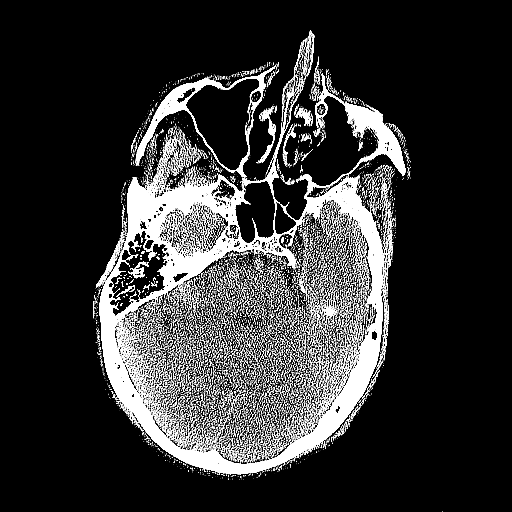
[im 27/88  bone]
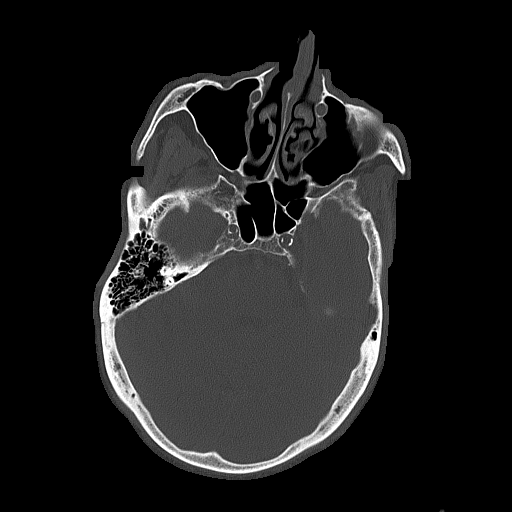
[im 31/88  brain]
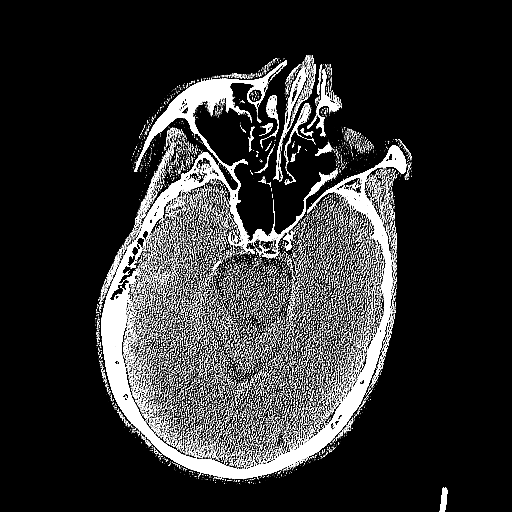
[im 35/88  brain]
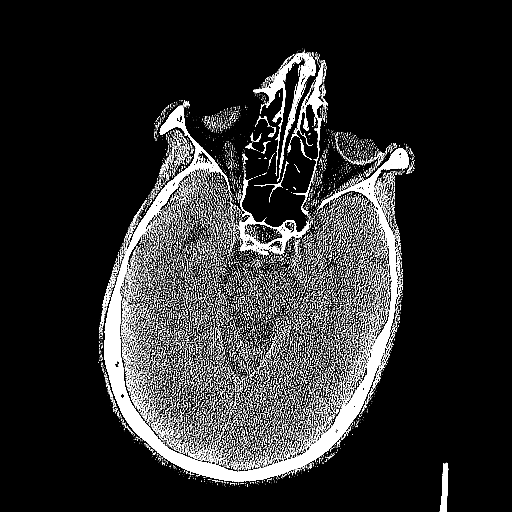
[im 40/88  brain]
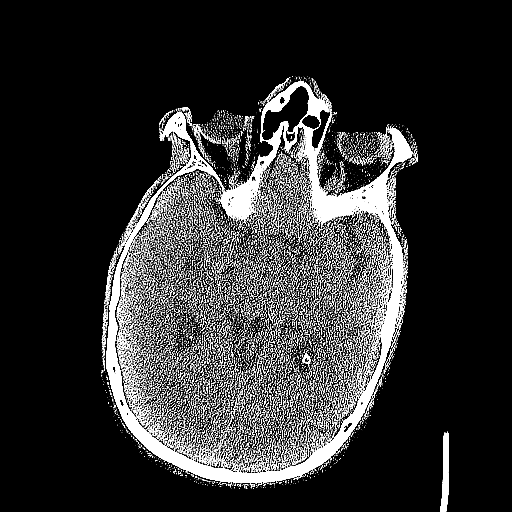
[im 48/88  brain]
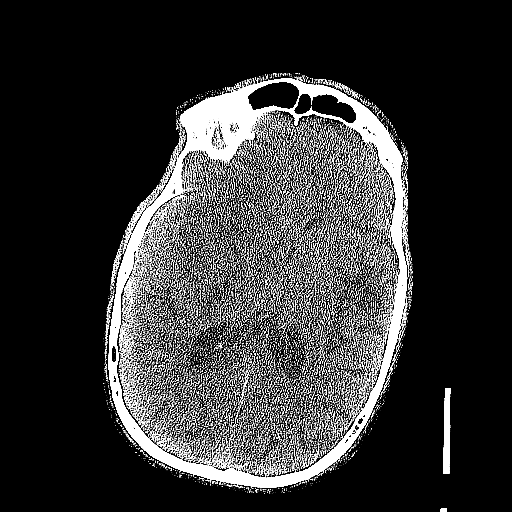
[im 48/88  bone]
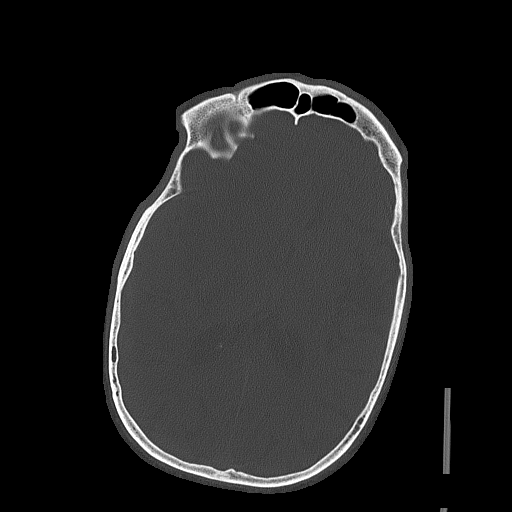
[im 53/88  brain]
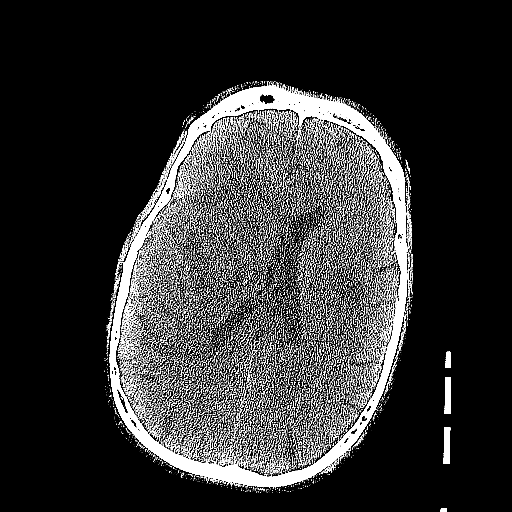
[im 57/88  brain]
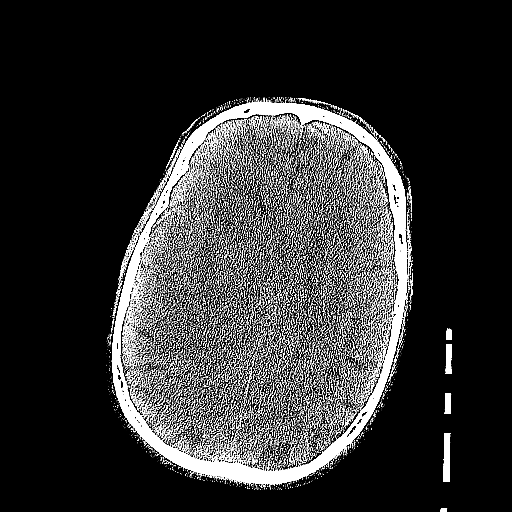
[im 61/88  brain]
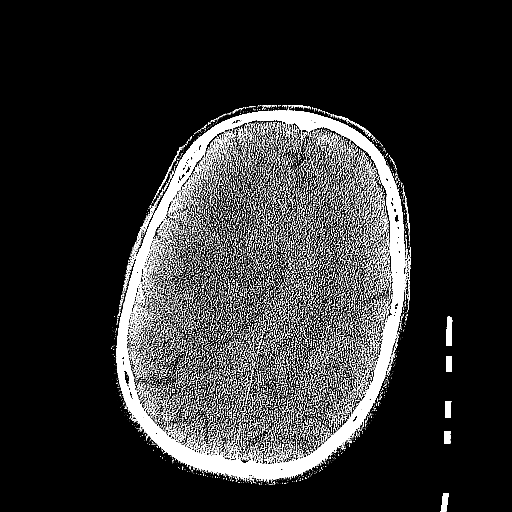
[im 66/88  brain]
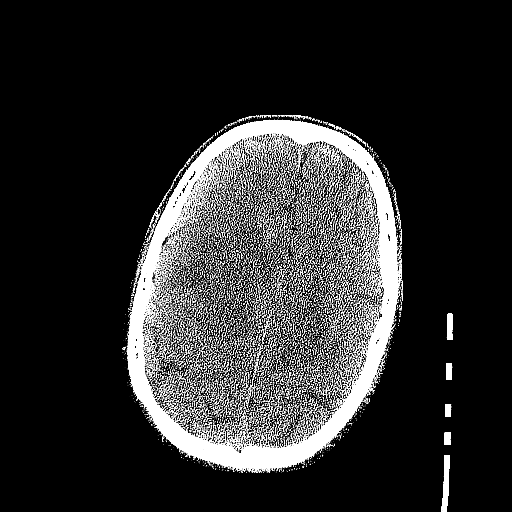
[im 66/88  bone]
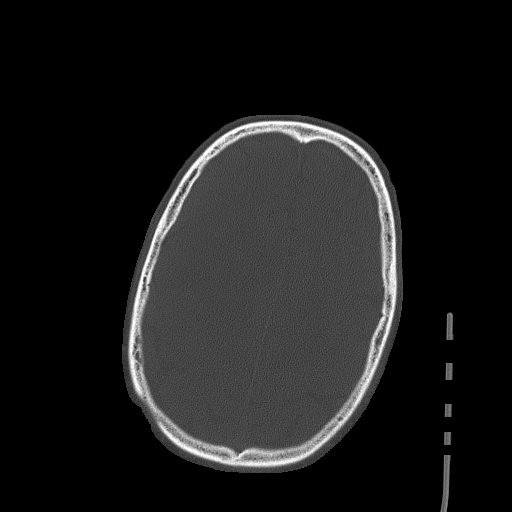
[im 74/88  brain]
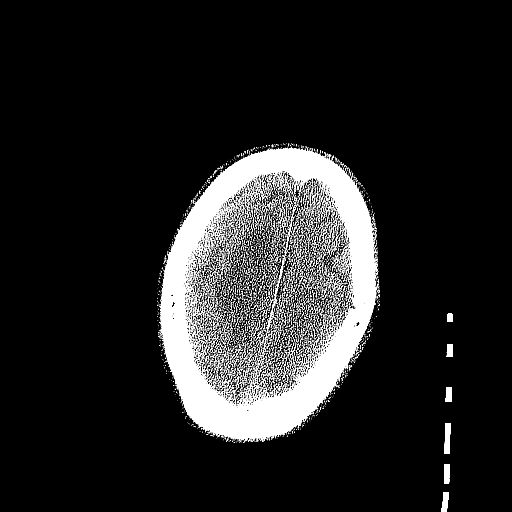
[im 79/88  brain]
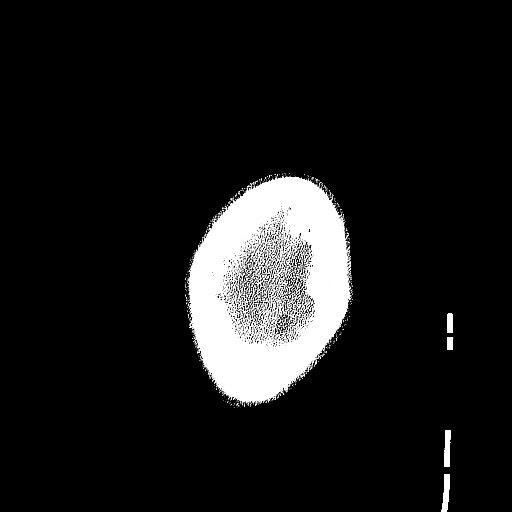
[im 83/88  brain]
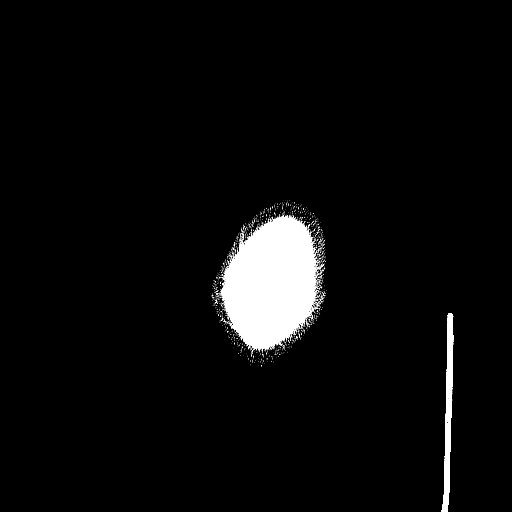

[16 of 30 positions shown; findings below may reference images not displayed]

FINDINGS: There has been rapidly progression in the apparent size of brain
lesions in the right basal ganglia, posterior left frontal lobe, and
right frontal centrum semiovale. There is increased edema around
each lesion with increased compression of frontal horn of the right
lateral ventricle with new midline shift of 3.7 mm.
IMPRESSION: Rapid progression in the size of the multiple brain lesions with due
right to left midline shift and increased edema surrounding the
lesions. The possibility of progressive brain abscess should be
considered particularly in view of the rapid progression.

## 2016-03-29 IMAGING — MR MR CERVICAL SPINE WO/W CM
13 of 22 series · 28 of 48 positions shown · IV contrast (multihance)
Comparison: CT of the chest, abdomen and pelvis December 09, 2013;
MRI of the brain December 01, 2013

CLINICAL DATA: History of stroke, MRSA infection and intravenous
drug use presents with fever and leg pain, multiple falls. Diagnosed
with intracranial lesions on December 01, 2013 which may reflect
abscess or metastatic disease.

EXAM:
MRI TOTAL SPINE WITHOUT AND WITH CONTRAST
TECHNIQUE: Multisequence MR imaging of the spine from the cervical spine to the
sacrum was performed prior to and following IV contrast
administration for evaluation of spinal metastatic disease.
CONTRAST:  12mL MULTIHANCE GADOBENATE DIMEGLUMINE 529 MG/ML IV SOLN

[Series 2: T2 · sagittal · 3.0mm · 0.43mm/px · 2 of 13 slices shown (1 of 5)]
[im 1/13]
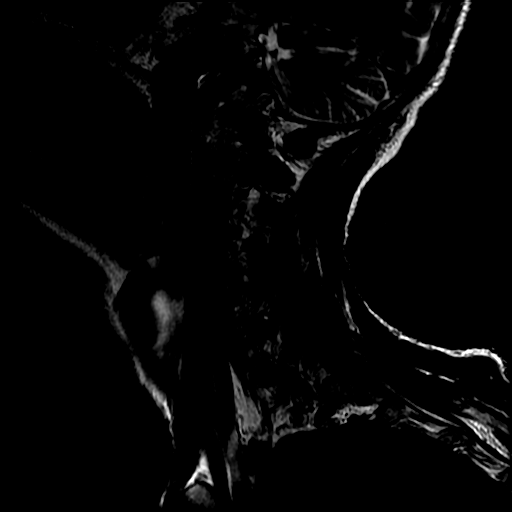
[im 13/13]
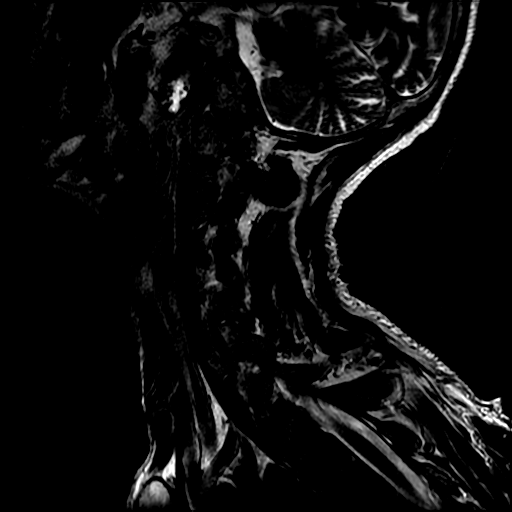

[Series 3: T1 · sagittal · 3.0mm · 0.43mm/px · 2 of 13 slices shown (1 of 5)]
[im 1/13]
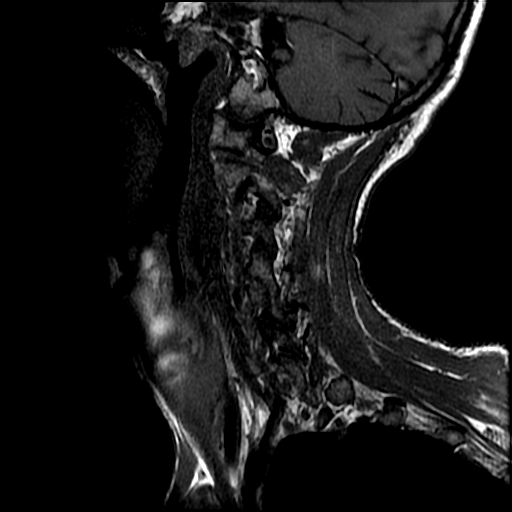
[im 13/13]
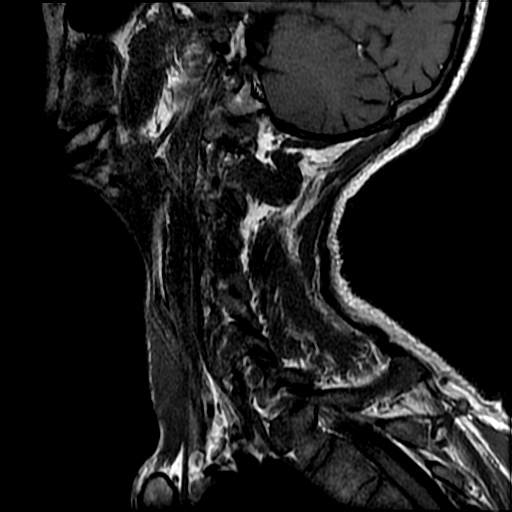

[Series 5: T2 · axial · 3.0mm · 0.78mm/px · z∈[-171,-61]mm · 4 of 34 slices shown (2 of 5)]
[im 1/34]
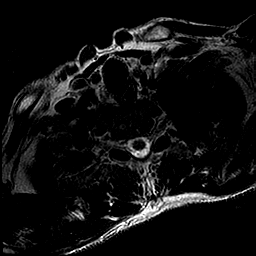
[im 12/34]
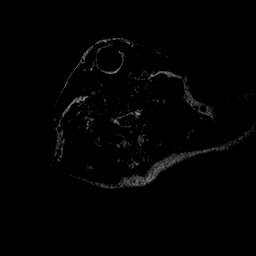
[im 23/34]
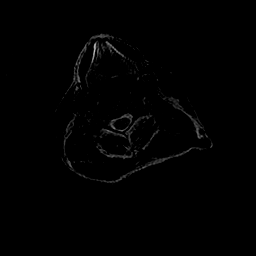
[im 34/34]
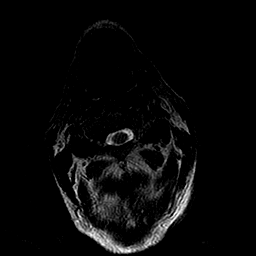

[Series 13: T1 · sagittal · 3.0mm · 0.62mm/px · 1 of 15 slices shown (2 of 5)]
[im 1/15]
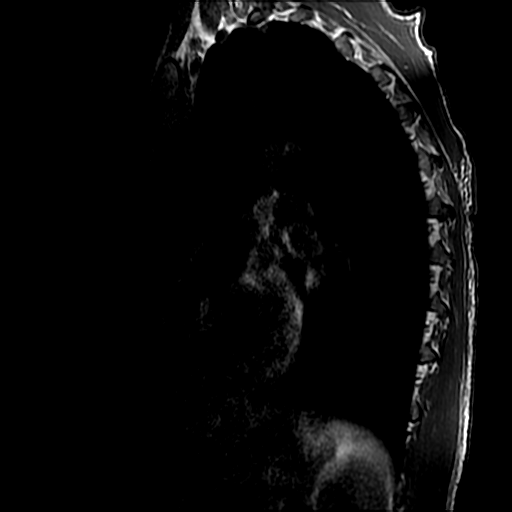

[Series 15: T2 · sagittal · 3.0mm · 0.62mm/px · 1 of 15 slices shown (3 of 5)]
[im 1/15]
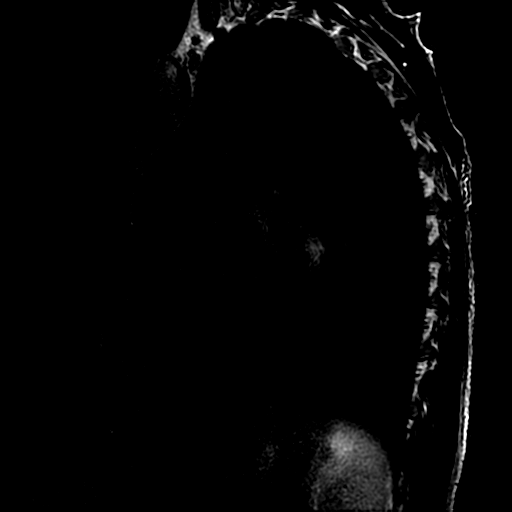

[Series 16: T2 · axial · 4.0mm · 0.43mm/px · z∈[-399,-182]mm · 3 of 36 slices shown (4 of 5)]
[im 1/36]
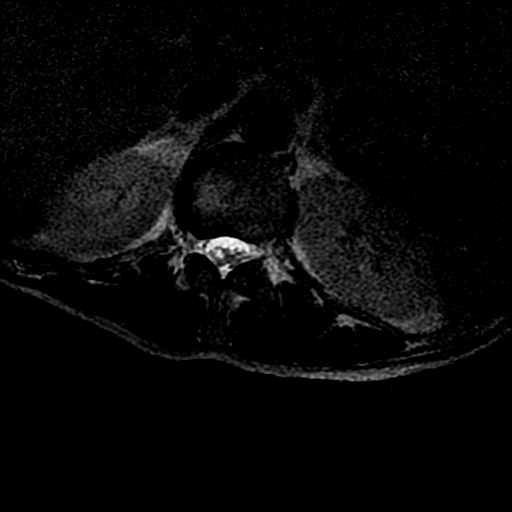
[im 18/36]
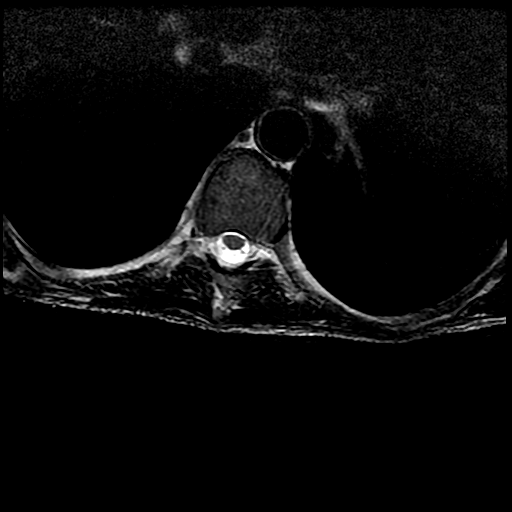
[im 36/36]
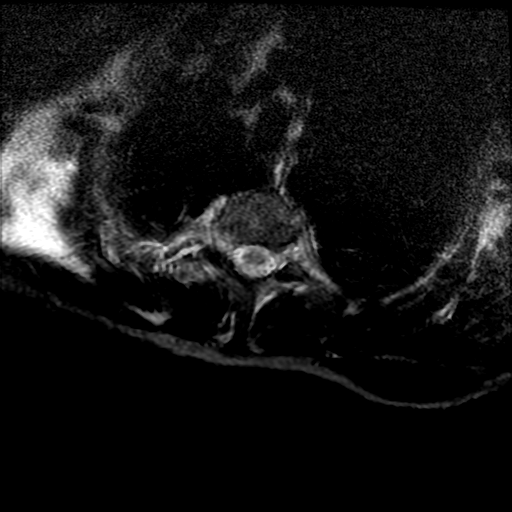

[Series 17: T1 · axial · non-contrast · 4.0mm · 0.86mm/px · z∈[-399,-182]mm · 3 of 36 slices shown (3 of 5)]
[im 1/36]
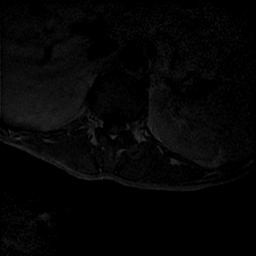
[im 18/36]
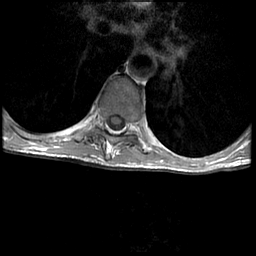
[im 36/36]
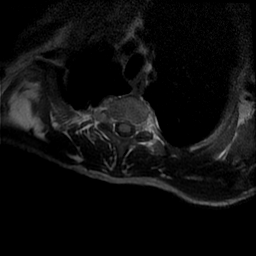

[Series 22: T1 · sagittal · 4.0mm · 0.55mm/px · 1 of 15 slices shown (4 of 5)]
[im 1/15]
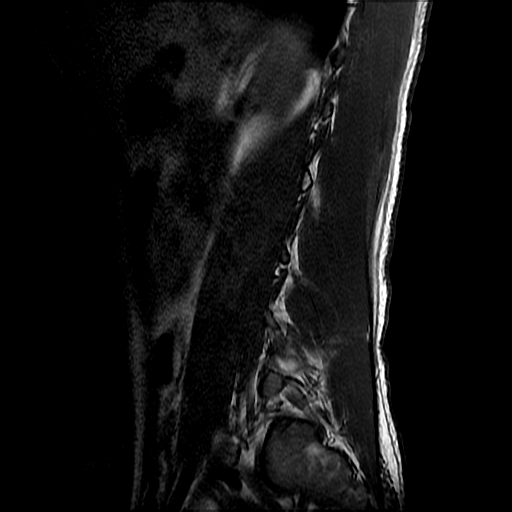

[Series 23: T2 · axial · 4.0mm · 0.78mm/px · z∈[-657,-427]mm · 4 of 41 slices shown (5 of 5)]
[im 1/41]
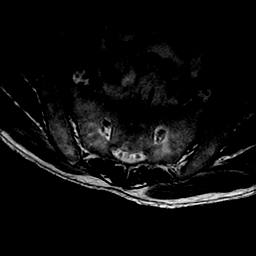
[im 14/41]
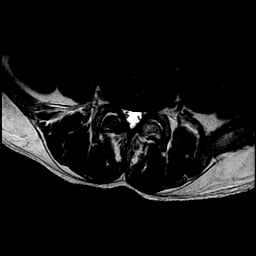
[im 27/41]
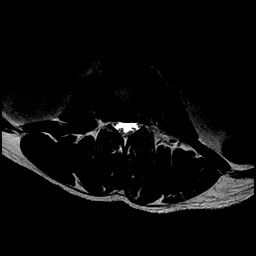
[im 41/41]
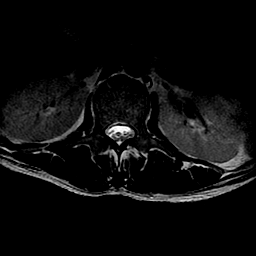

[Series 24: T1 · axial · 4.0mm · 0.78mm/px · z∈[-657,-427]mm · 4 of 41 slices shown (5 of 5)]
[im 1/41]
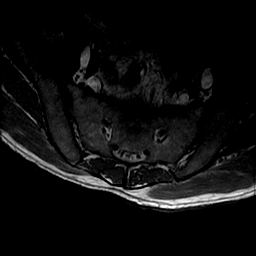
[im 14/41]
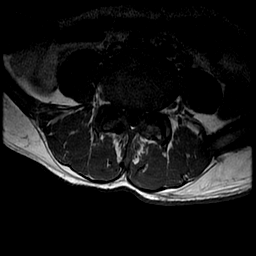
[im 27/41]
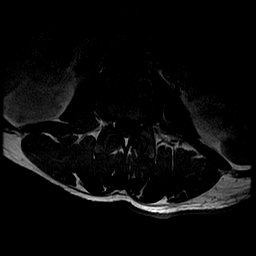
[im 41/41]
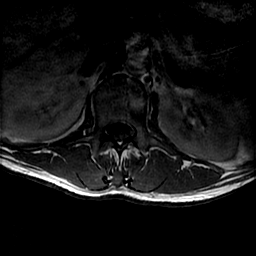

[Series 29: T1 fat-sat post-contrast · sagittal · 3.0mm · 0.86mm/px · 1 of 7 slices shown (1 of 3)]
[im 1/7]
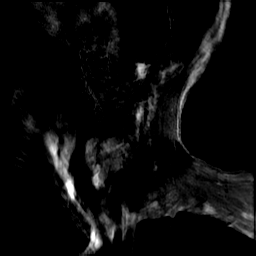

[Series 30: T1 fat-sat post-contrast · sagittal · 3.0mm · 0.86mm/px · 1 of 13 slices shown (2 of 3)]
[im 1/13]
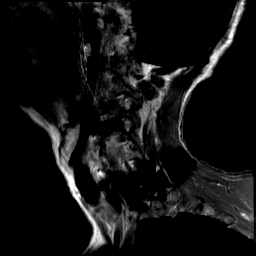

[Series 32: T1 fat-sat post-contrast · sagittal · 3.0mm · 0.86mm/px · 1 of 13 slices shown (3 of 3)]
[im 1/13]
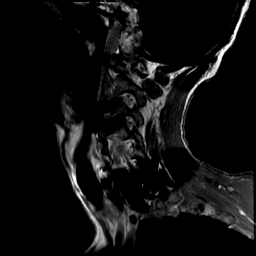

[28 of 48 positions shown; findings below may reference images not displayed]

FINDINGS: Cervical Findings:

Cervical vertebral bodies and posterior elements appear intact and
aligned with maintenance of cervical lordosis. Mild motion degraded
examination. Status post C4-5 ACDF, which results in some
susceptibility artifact. Mild to moderate C5-6 degenerative disc,
with decreased T2 signal within all cervical disc consistent with
desiccation. Mild chronic discogenic endplate changes at all
cervical levels. No STIR signal abnormality to suggest acute osseous
process. Limited post gadolinium sequence due to spinal hardware,

Cervical spinal cord appears normal morphology and signal
characteristics from the cervical medullary junction to at least
level of T3-4, most caudal well visualized level. Craniocervical
junction maintained. Mild congenital canal narrowing on the basis of
foreshortened pedicles. No convincing evidence of abnormal cord,
leptomeningeal or epidural enhancement though, limited post
gadolinium sagittal sequences and, due to patient's intolerance for
further imaging, axial T1 post gadolinium sequence not obtained.

Level by level evaluation:

C2-3: No disc bulge, canal stenosis or neural foraminal narrowing.

C3-4: Uncovertebral hypertrophy and minimal annular bulging without
canal stenosis or neural foraminal narrowing.

C4-5 ACDF.

C5-6: 3 mm RIGHT central broad-based disc protrusion. Uncovertebral
hypertrophy. Moderate canal stenosis. Severe RIGHT, moderate to
severe LEFT neural foraminal narrowing. There is likely a component
adjacent segment disease.

C6-7: 2 mm broad-based disc bulge, mild canal stenosis. Minimal
neural foraminal narrowing.

C7-T1: No disc bulge, canal stenosis nor neural foraminal narrowing.

Thoracic Findings:

Mild chronic T4 compression fracture. T2 superior endplate Schmorl's
node. No acute fracture. No malalignment. Mild T6 70 T7-8
degenerative discs. Nose abnormal bone marrow signal to suggest
acute osseous process. No suspicious osseous nor intradiscal
enhancement.

Spinal cord appears normal morphology and signal characteristics
with conus medullaris which is partially imaged at T12. No abnormal
cord, leptomeningeal or upper dural enhancement. Included
prevertebral and paraspinal soft tissues are nonsuspicious.

No significant disc bulge, canal stenosis or neural foraminal
narrowing at any thoracic level. Moderate T8-9 facet arthropathy.

Lumbar Findings:

Lumbar vertebral bodies and posterior elements are intact and
aligned with maintenance of the lumbar lordosis. Moderate L5-S1
degenerative disc, mild desiccation L3-4 and L4-5. Mild subacute to
chronic discogenic endplate changes L2-3 through L4-5. No STIR
signal abnormality to suggest acute osseous process. No suspicious
osseous nor intradiscal enhancement.

Conus medullaris terminates at T12-L1 and appears normal in
morphology and signal characteristics. Cauda equina is unremarkable.
No suspicious cord, leptomeningeal nor epidural enhancement.
Included prevertebral and paraspinal soft tissues are nonsuspicious.

Level by level evaluation:

L1-2 and L2-3: No disc bulge, canal stenosis nor neural foraminal
narrowing.

L3-4: 2 mm broad-based disc bulge, mild facet arthropathy and
ligamentum flavum redundancy. Minimal canal stenosis. Mild bilateral
neural foraminal narrowing.

L4-5: 2 mm broad-based disc bulge. Mild to moderate facet
arthropathy and ligamentum flavum redundancy. Minimal canal
stenosis. Moderate bilateral neural foraminal narrowing.

L5-S1: Conjoined RIGHT L5-S1 nerve root. No significant disc bulge.
Moderate facet arthropathy without canal stenosis. Mild RIGHT neural
foraminal narrowing.
IMPRESSION: MRI CERVICAL SPINE: Severely limited sagittal Abimelk Tiger. Axial T1
post gadolinium sequences not obtained due to inability to tolerate
further imaging.

No suspicious MR findings to suggest metastatic disease or infection
with cervical spine.

Status post C4-5 ACDF. Degenerative change of the cervical spine
superimposed on a background congenital canal narrowing.

Moderate canal stenosis at C5-6, mild at C6-7. Severe RIGHT C5-6
neural foraminal narrowing.

MRI THORACIC SPINE: No MR findings to suggest metastatic disease or
infection within the thoracic spine.

Mild thoracic degenerative change without neurocompressive findings.

Remote Mild T4 compression fracture.

MRI LUMBAR SPINE: No MR findings to suggest metastatic disease or
infection within the lumbar spine.

Degenerative change of the lumbar spine. Minimal canal stenosis L3-4
and L4-5.

Neural foraminal narrowing L3-4 through L5-S1: Moderate bilaterally
at L4-5.

Acute findings discussed with and reconfirmed by Dr.LAKIA JALOLOV on

  By: Benrabah Etoil

## 2016-03-29 IMAGING — MR MR CERVICAL SPINE WO/W CM
2 series · 16 of 26 positions shown · IV contrast (multihance)
Comparison: CT of the chest, abdomen and pelvis December 09, 2013;
MRI of the brain December 01, 2013

CLINICAL DATA: History of stroke, MRSA infection and intravenous
drug use presents with fever and leg pain, multiple falls. Diagnosed
with intracranial lesions on December 01, 2013 which may reflect
abscess or metastatic disease.

EXAM:
MRI TOTAL SPINE WITHOUT AND WITH CONTRAST
TECHNIQUE: Multisequence MR imaging of the spine from the cervical spine to the
sacrum was performed prior to and following IV contrast
administration for evaluation of spinal metastatic disease.
CONTRAST:  12mL MULTIHANCE GADOBENATE DIMEGLUMINE 529 MG/ML IV SOLN

[Series 3: T2 · sagittal · 4.0mm · 0.55mm/px · 9 of 13 slices shown]
[im 1/13]
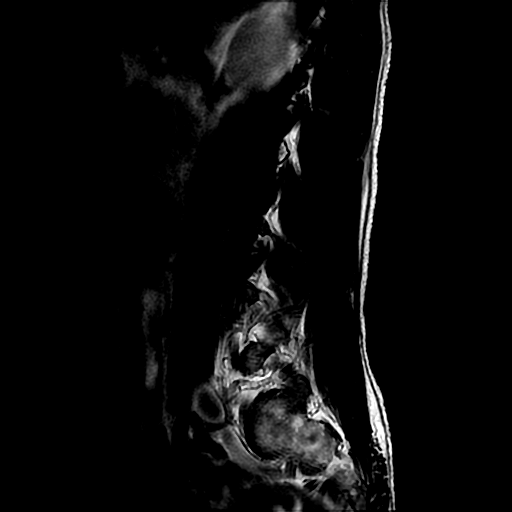
[im 3/13]
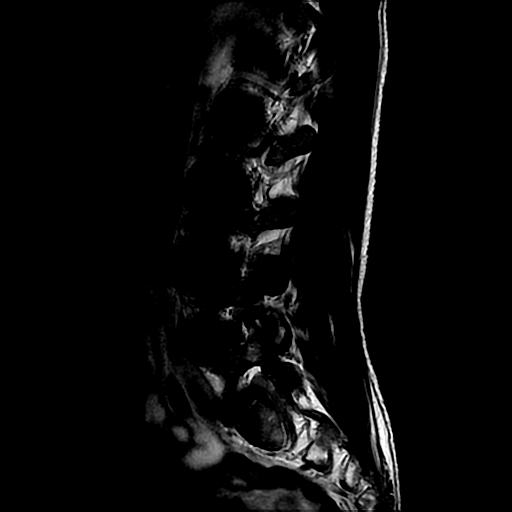
[im 5/13]
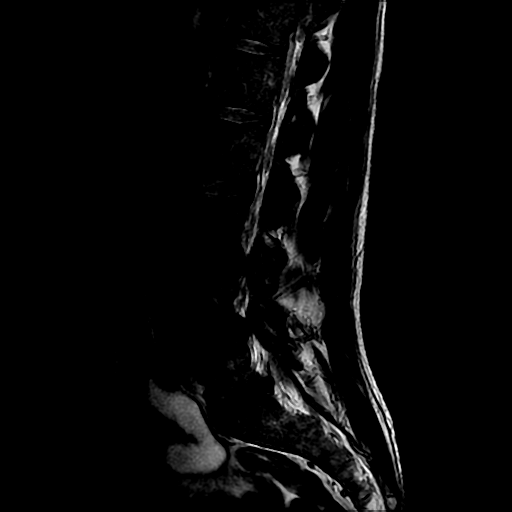
[im 6/13]
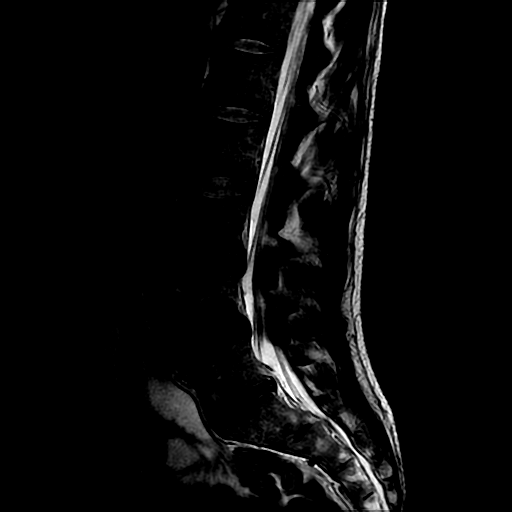
[im 7/13]
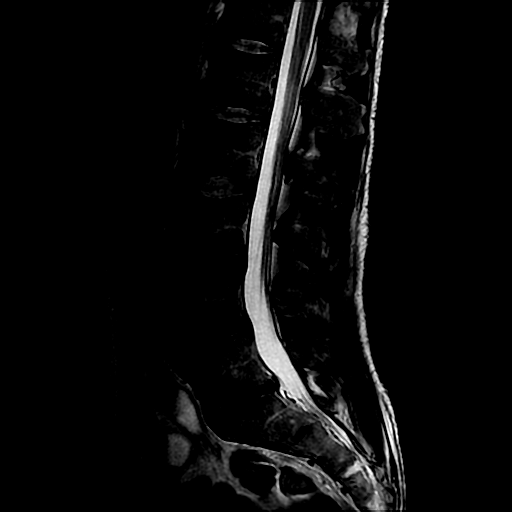
[im 8/13]
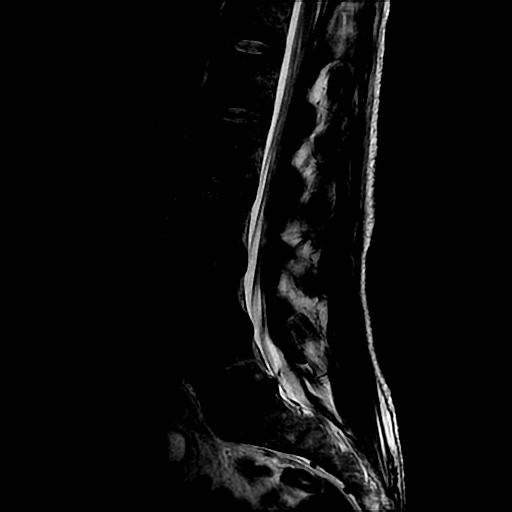
[im 9/13]
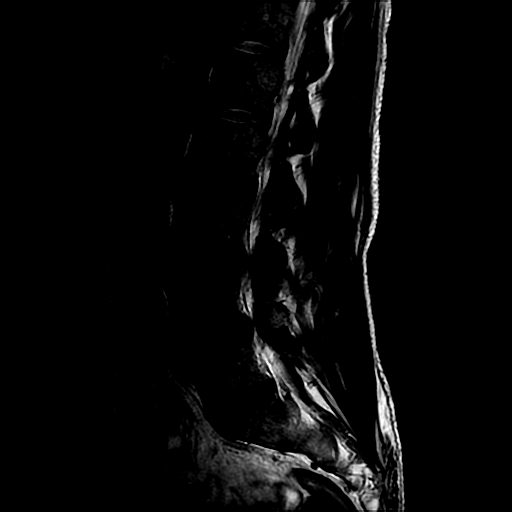
[im 11/13]
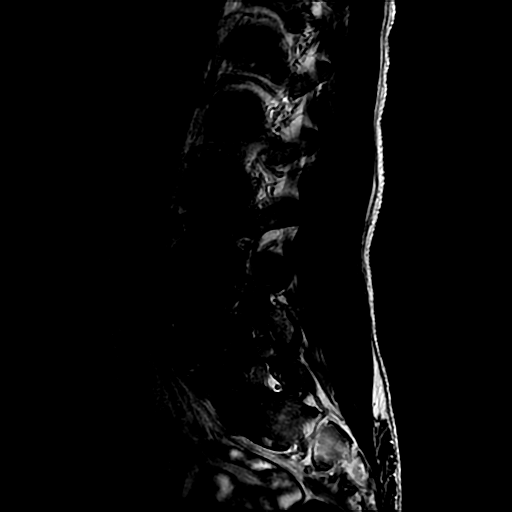
[im 13/13]
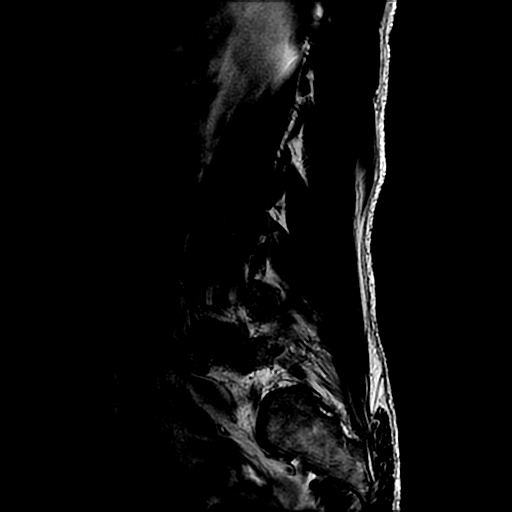

[Series 4: sag ir · sagittal · 4.0mm · 0.55mm/px · 7 of 13 slices shown]
[im 1/13]
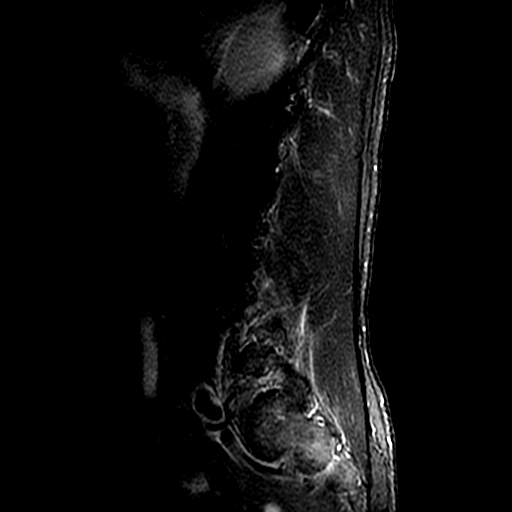
[im 3/13]
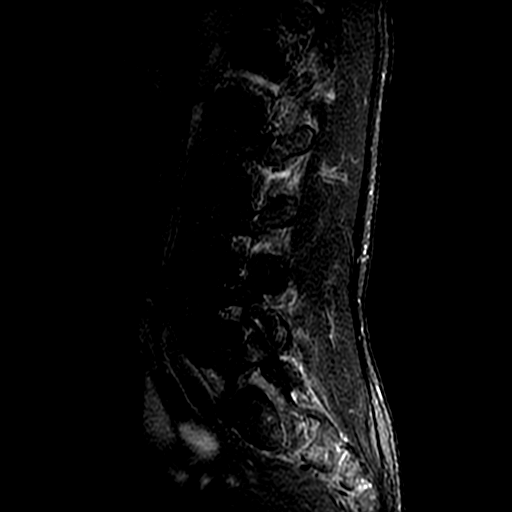
[im 5/13]
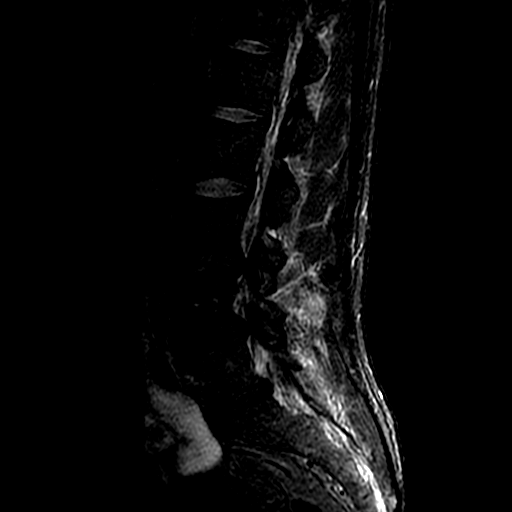
[im 6/13]
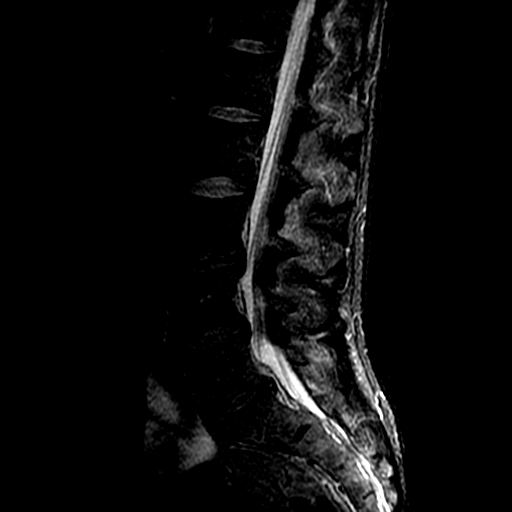
[im 7/13]
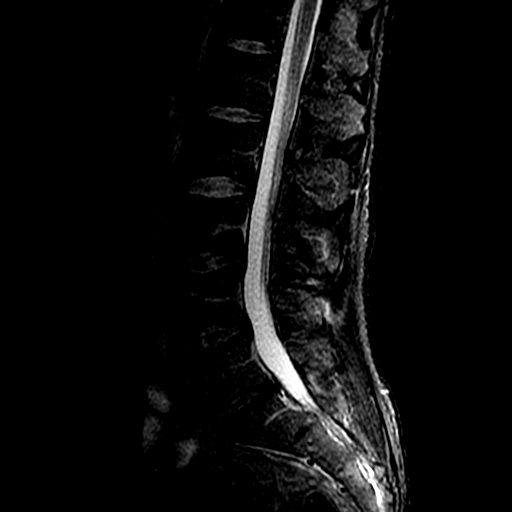
[im 8/13]
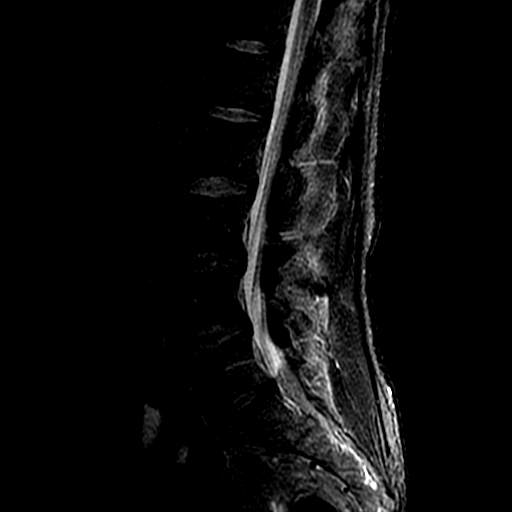
[im 11/13]
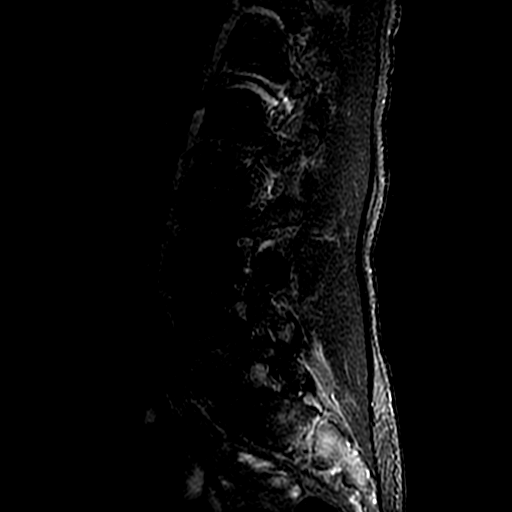

[16 of 26 positions shown; findings below may reference images not displayed]

FINDINGS: Cervical Findings:

Cervical vertebral bodies and posterior elements appear intact and
aligned with maintenance of cervical lordosis. Mild motion degraded
examination. Status post C4-5 ACDF, which results in some
susceptibility artifact. Mild to moderate C5-6 degenerative disc,
with decreased T2 signal within all cervical disc consistent with
desiccation. Mild chronic discogenic endplate changes at all
cervical levels. No STIR signal abnormality to suggest acute osseous
process. Limited post gadolinium sequence due to spinal hardware,

Cervical spinal cord appears normal morphology and signal
characteristics from the cervical medullary junction to at least
level of T3-4, most caudal well visualized level. Craniocervical
junction maintained. Mild congenital canal narrowing on the basis of
foreshortened pedicles. No convincing evidence of abnormal cord,
leptomeningeal or epidural enhancement though, limited post
gadolinium sagittal sequences and, due to patient's intolerance for
further imaging, axial T1 post gadolinium sequence not obtained.

Level by level evaluation:

C2-3: No disc bulge, canal stenosis or neural foraminal narrowing.

C3-4: Uncovertebral hypertrophy and minimal annular bulging without
canal stenosis or neural foraminal narrowing.

C4-5 ACDF.

C5-6: 3 mm RIGHT central broad-based disc protrusion. Uncovertebral
hypertrophy. Moderate canal stenosis. Severe RIGHT, moderate to
severe LEFT neural foraminal narrowing. There is likely a component
adjacent segment disease.

C6-7: 2 mm broad-based disc bulge, mild canal stenosis. Minimal
neural foraminal narrowing.

C7-T1: No disc bulge, canal stenosis nor neural foraminal narrowing.

Thoracic Findings:

Mild chronic T4 compression fracture. T2 superior endplate Schmorl's
node. No acute fracture. No malalignment. Mild T6 70 T7-8
degenerative discs. Nose abnormal bone marrow signal to suggest
acute osseous process. No suspicious osseous nor intradiscal
enhancement.

Spinal cord appears normal morphology and signal characteristics
with conus medullaris which is partially imaged at T12. No abnormal
cord, leptomeningeal or upper dural enhancement. Included
prevertebral and paraspinal soft tissues are nonsuspicious.

No significant disc bulge, canal stenosis or neural foraminal
narrowing at any thoracic level. Moderate T8-9 facet arthropathy.

Lumbar Findings:

Lumbar vertebral bodies and posterior elements are intact and
aligned with maintenance of the lumbar lordosis. Moderate L5-S1
degenerative disc, mild desiccation L3-4 and L4-5. Mild subacute to
chronic discogenic endplate changes L2-3 through L4-5. No STIR
signal abnormality to suggest acute osseous process. No suspicious
osseous nor intradiscal enhancement.

Conus medullaris terminates at T12-L1 and appears normal in
morphology and signal characteristics. Cauda equina is unremarkable.
No suspicious cord, leptomeningeal nor epidural enhancement.
Included prevertebral and paraspinal soft tissues are nonsuspicious.

Level by level evaluation:

L1-2 and L2-3: No disc bulge, canal stenosis nor neural foraminal
narrowing.

L3-4: 2 mm broad-based disc bulge, mild facet arthropathy and
ligamentum flavum redundancy. Minimal canal stenosis. Mild bilateral
neural foraminal narrowing.

L4-5: 2 mm broad-based disc bulge. Mild to moderate facet
arthropathy and ligamentum flavum redundancy. Minimal canal
stenosis. Moderate bilateral neural foraminal narrowing.

L5-S1: Conjoined RIGHT L5-S1 nerve root. No significant disc bulge.
Moderate facet arthropathy without canal stenosis. Mild RIGHT neural
foraminal narrowing.
IMPRESSION: MRI CERVICAL SPINE: Severely limited sagittal Abimelk Tiger. Axial T1
post gadolinium sequences not obtained due to inability to tolerate
further imaging.

No suspicious MR findings to suggest metastatic disease or infection
with cervical spine.

Status post C4-5 ACDF. Degenerative change of the cervical spine
superimposed on a background congenital canal narrowing.

Moderate canal stenosis at C5-6, mild at C6-7. Severe RIGHT C5-6
neural foraminal narrowing.

MRI THORACIC SPINE: No MR findings to suggest metastatic disease or
infection within the thoracic spine.

Mild thoracic degenerative change without neurocompressive findings.

Remote Mild T4 compression fracture.

MRI LUMBAR SPINE: No MR findings to suggest metastatic disease or
infection within the lumbar spine.

Degenerative change of the lumbar spine. Minimal canal stenosis L3-4
and L4-5.

Neural foraminal narrowing L3-4 through L5-S1: Moderate bilaterally
at L4-5.

Acute findings discussed with and reconfirmed by Dr.LAKIA JALOLOV on

  By: Benrabah Etoil

## 2016-03-30 IMAGING — CT CT HEAD W/ CM
1 of 2 series · 13 of 30 positions shown, 17 images · IV contrast (omnipaque)
Comparison: 12/04/2013 head CT.  12/01/2013 brain MR.

CLINICAL DATA: 53-year-old male with intracranial abscesses.
Stealth exam. Pre surgical. Subsequent encounter.

EXAM:
CT HEAD WITH CONTRAST
TECHNIQUE: Contiguous axial images were obtained from the base of the skull
through the vertex with intravenous contrast.
CONTRAST:  80mL OMNIPAQUE IOHEXOL 300 MG/ML  SOLN

[Series 2: head 1.0 h30s · axial · 0.51mm/px · z∈[-195,-27]mm · 13 of 197 slices shown, 17 images]
[im 15/197  brain]
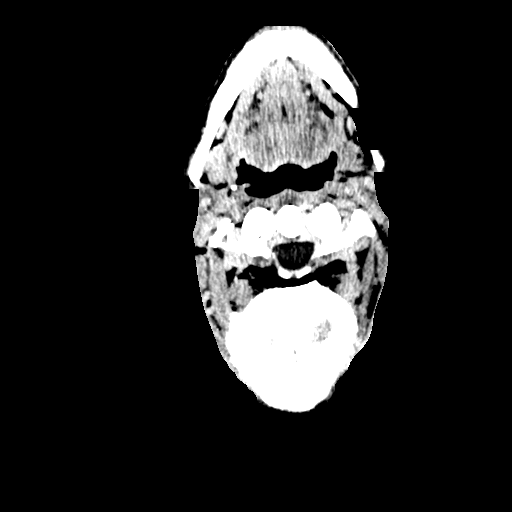
[im 15/197  bone]
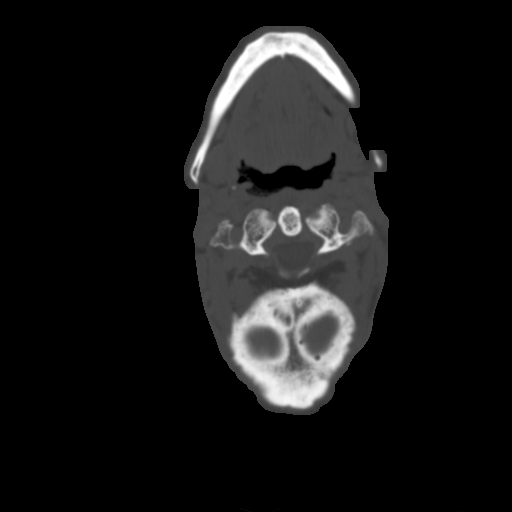
[im 29/197  brain]
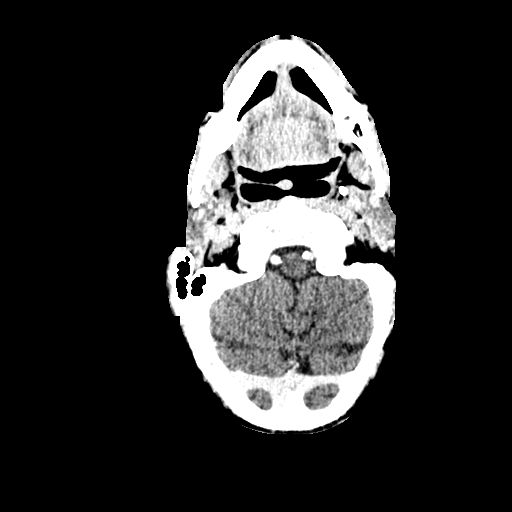
[im 43/197  brain]
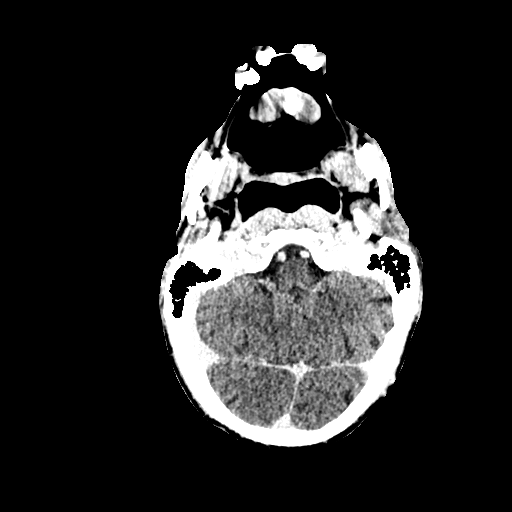
[im 57/197  brain]
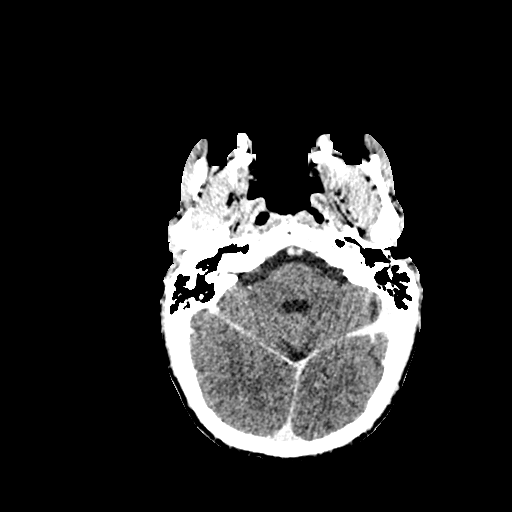
[im 71/197  brain]
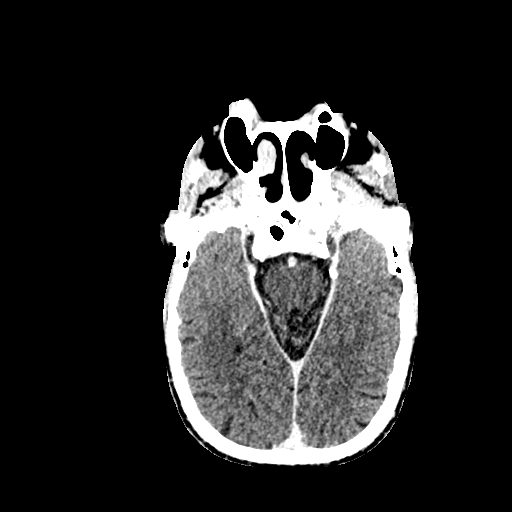
[im 71/197  bone]
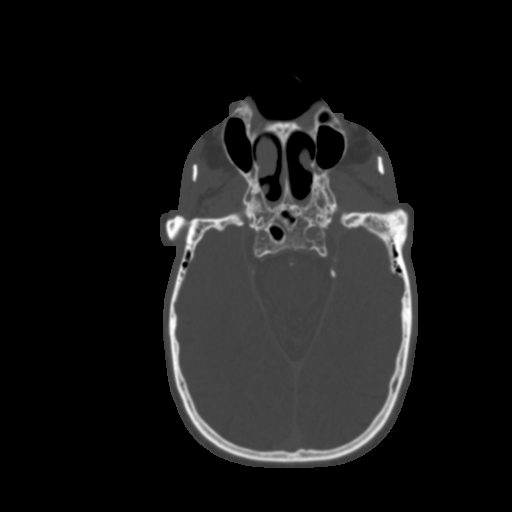
[im 85/197  brain]
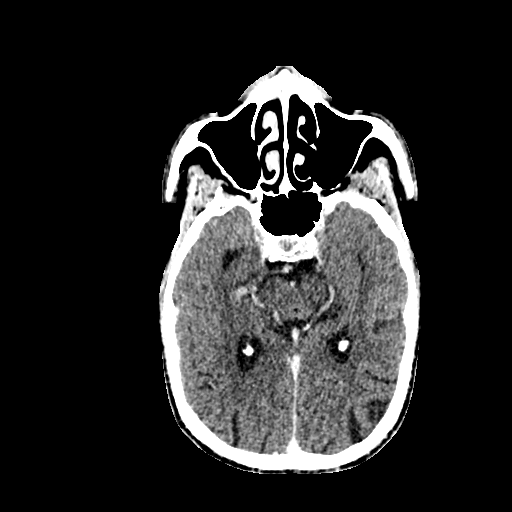
[im 99/197  brain]
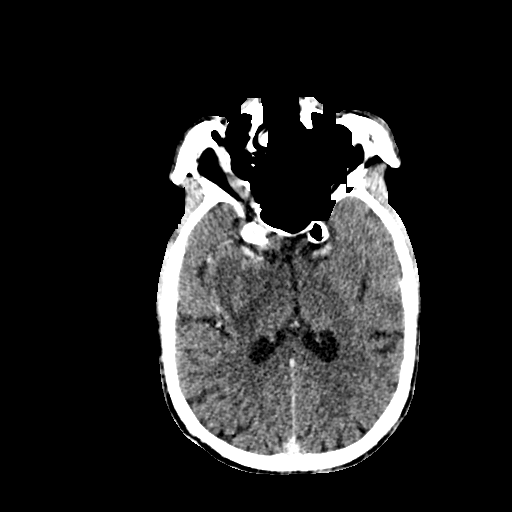
[im 113/197  brain]
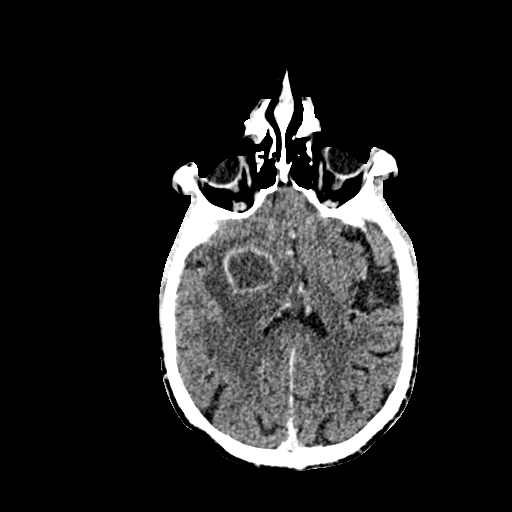
[im 127/197  brain]
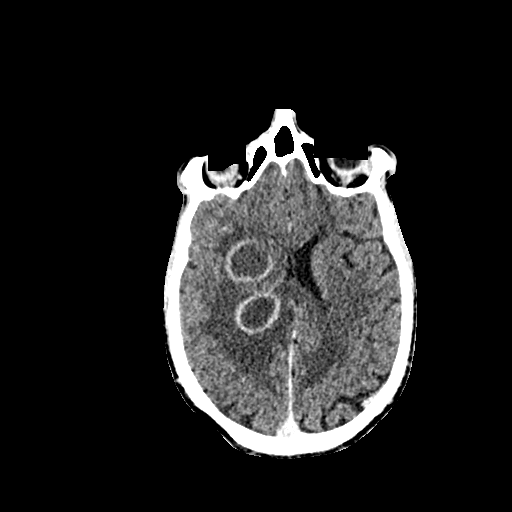
[im 127/197  bone]
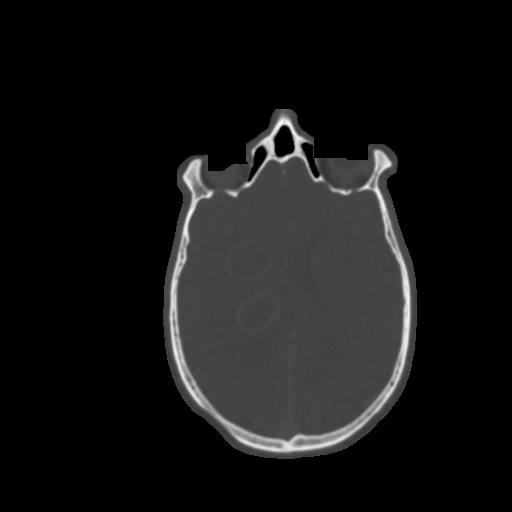
[im 141/197  brain]
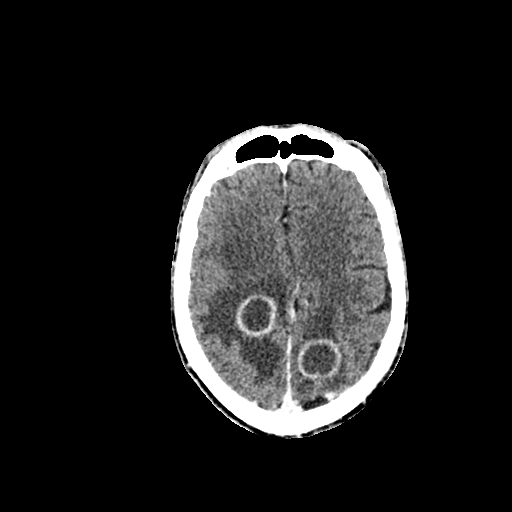
[im 155/197  brain]
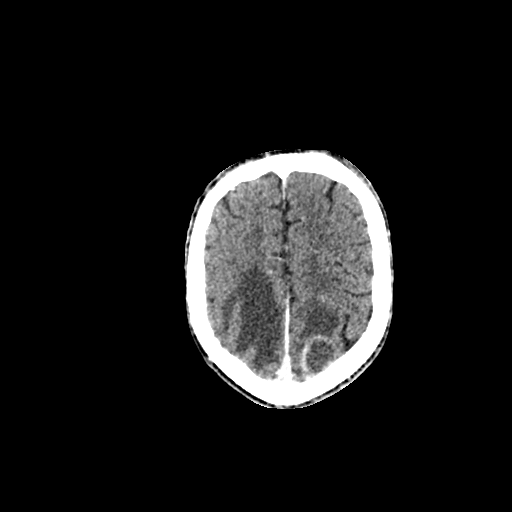
[im 169/197  brain]
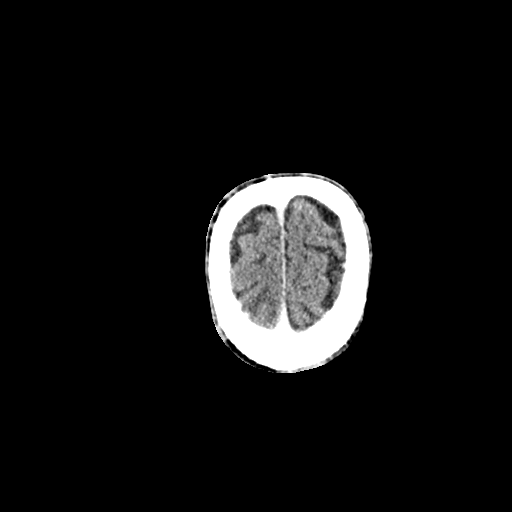
[im 183/197  brain]
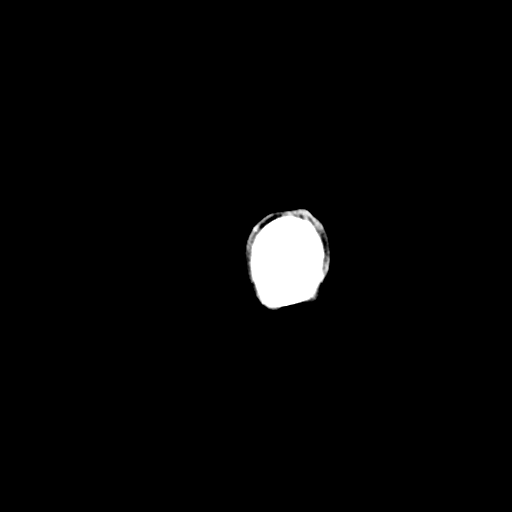
[im 183/197  bone]
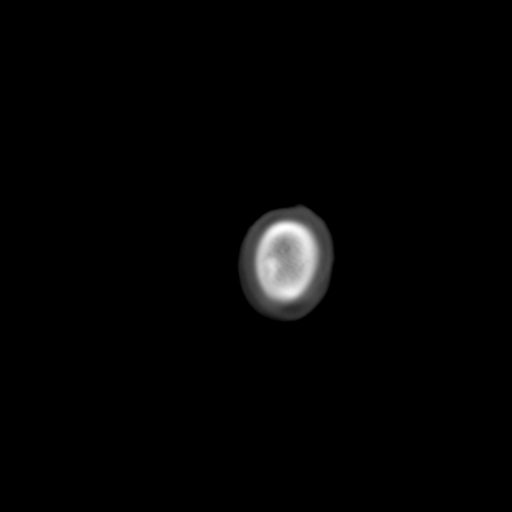

[13 of 30 positions shown; findings below may reference images not displayed]

FINDINGS: Three intracranial ring-enhancing lesions suggestive of intracranial
abscesses which have increased in size from the recent MR. This
includes posterior left frontal - parietal lobe 3 cm ring-enhancing
lesion, posterior right frontal lobe 2.7 cm enhancing lesion and
right basal ganglia 3.2 cm enhancing lesion.

Marked surrounding vasogenic edema and local mass effect including
compression of the right lateral ventricle and bowing of the third
ventricle to left.

No evidence of acute thrombotic infarct or intracranial hemorrhage.
IMPRESSION: Three intracranial ring-enhancing lesions suggestive of intracranial
abscesses which have increased in size from the recent MR. This
includes posterior left frontal - parietal lobe 3 cm ring-enhancing
lesion, posterior right frontal lobe 2.7 cm enhancing lesion and
right basal ganglia 3.2 cm enhancing lesion.

Marked surrounding vasogenic edema and local mass effect including
compression of the right lateral ventricle and bowing of the third
ventricle to left.

## 2016-03-31 IMAGING — CR DG ABD PORTABLE 1V
1 series · 1 of 1 positions shown · non-contrast
Comparison: 12/06/2013

CLINICAL DATA: Portable abdomen to assess for NG tube placement

EXAM:
PORTABLE ABDOMEN - 1 VIEW

[AP]
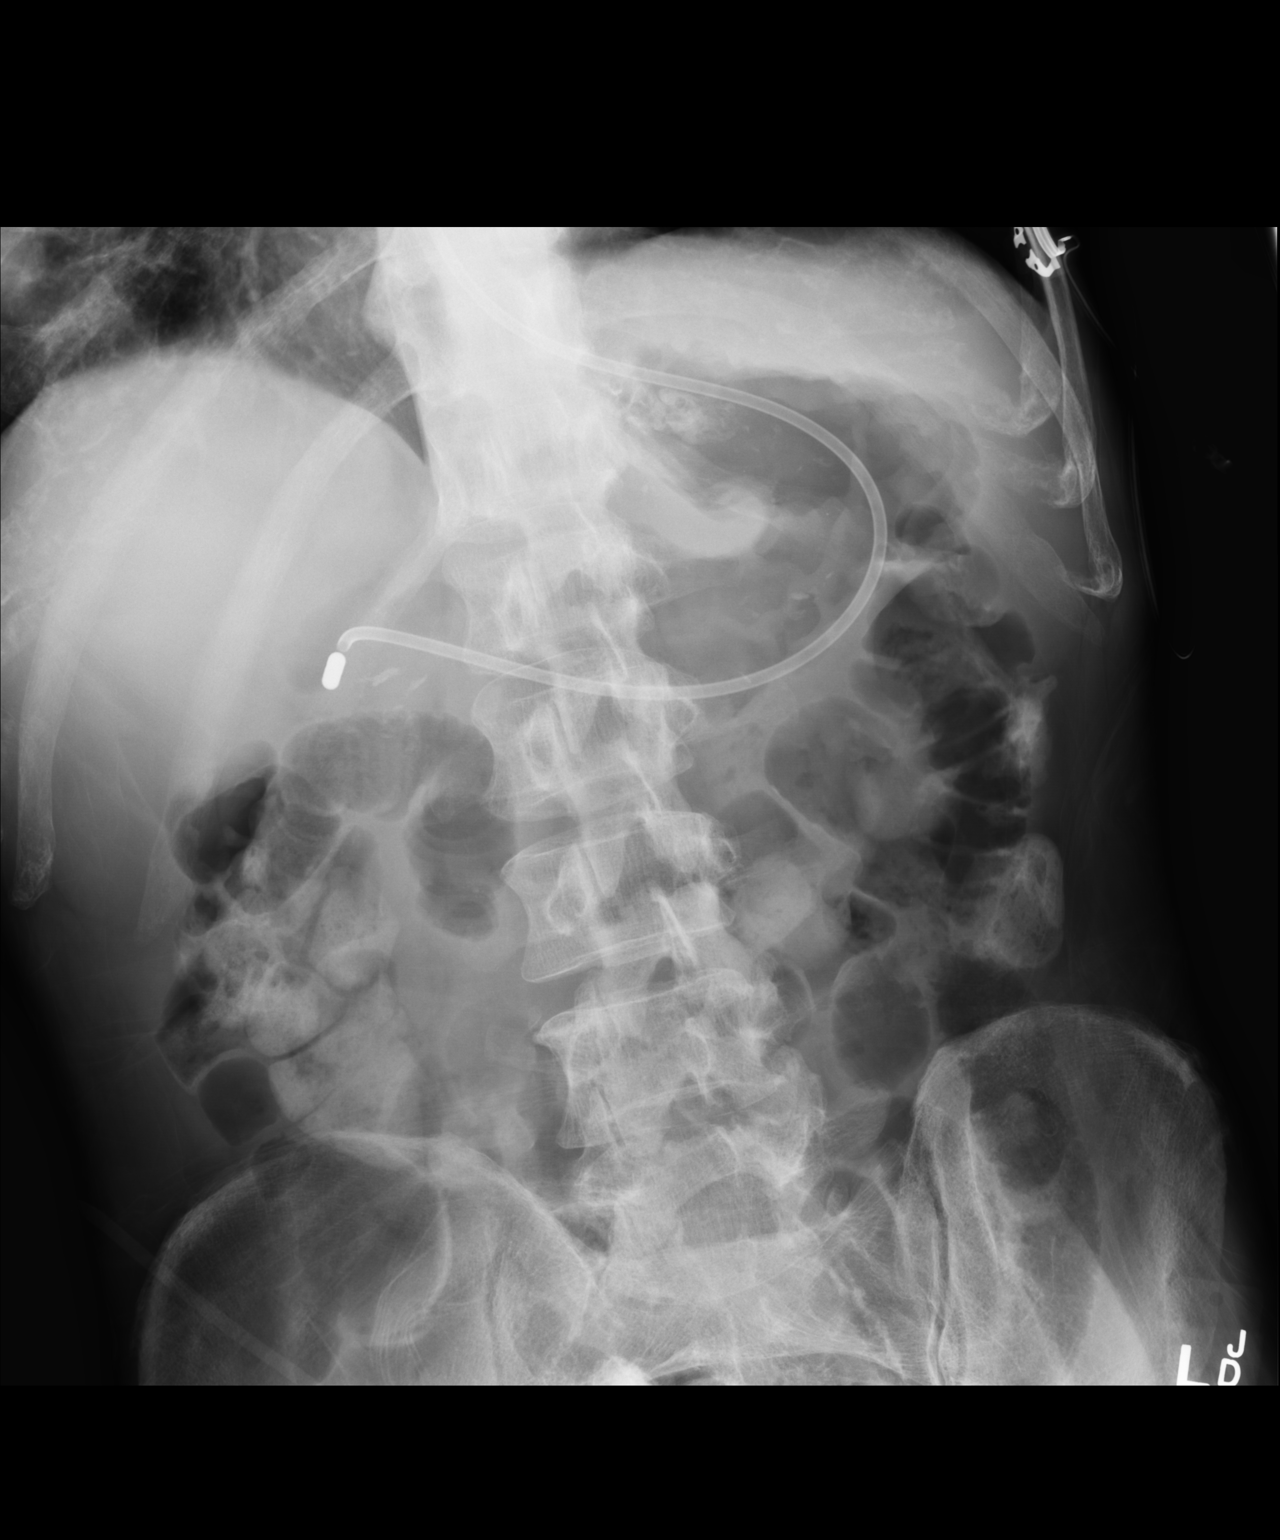

[1 of 1 positions shown; findings below may reference images not displayed]

FINDINGS: There is a feeding tube with tip in the projection of the proximal
portion of the duodenum. The bowel gas pattern appears nonspecific.
There are a few prominent loops of small bowel measuring up to
cm. Enteric contrast material is identified within pelvic small
bowel loops and proximal colon.
IMPRESSION: The tip of the feeding tube is in the expected location of the
proximal duodenum.

## 2016-03-31 IMAGING — CR DG ABD PORTABLE 1V
1 series · 1 of 1 positions shown · non-contrast
Comparison: 12/01/2013, 11/30/2013.

CLINICAL DATA: Nasogastric tube placement.  Subsequent encounter.

EXAM:
PORTABLE ABDOMEN - 1 VIEW

[supine ap]
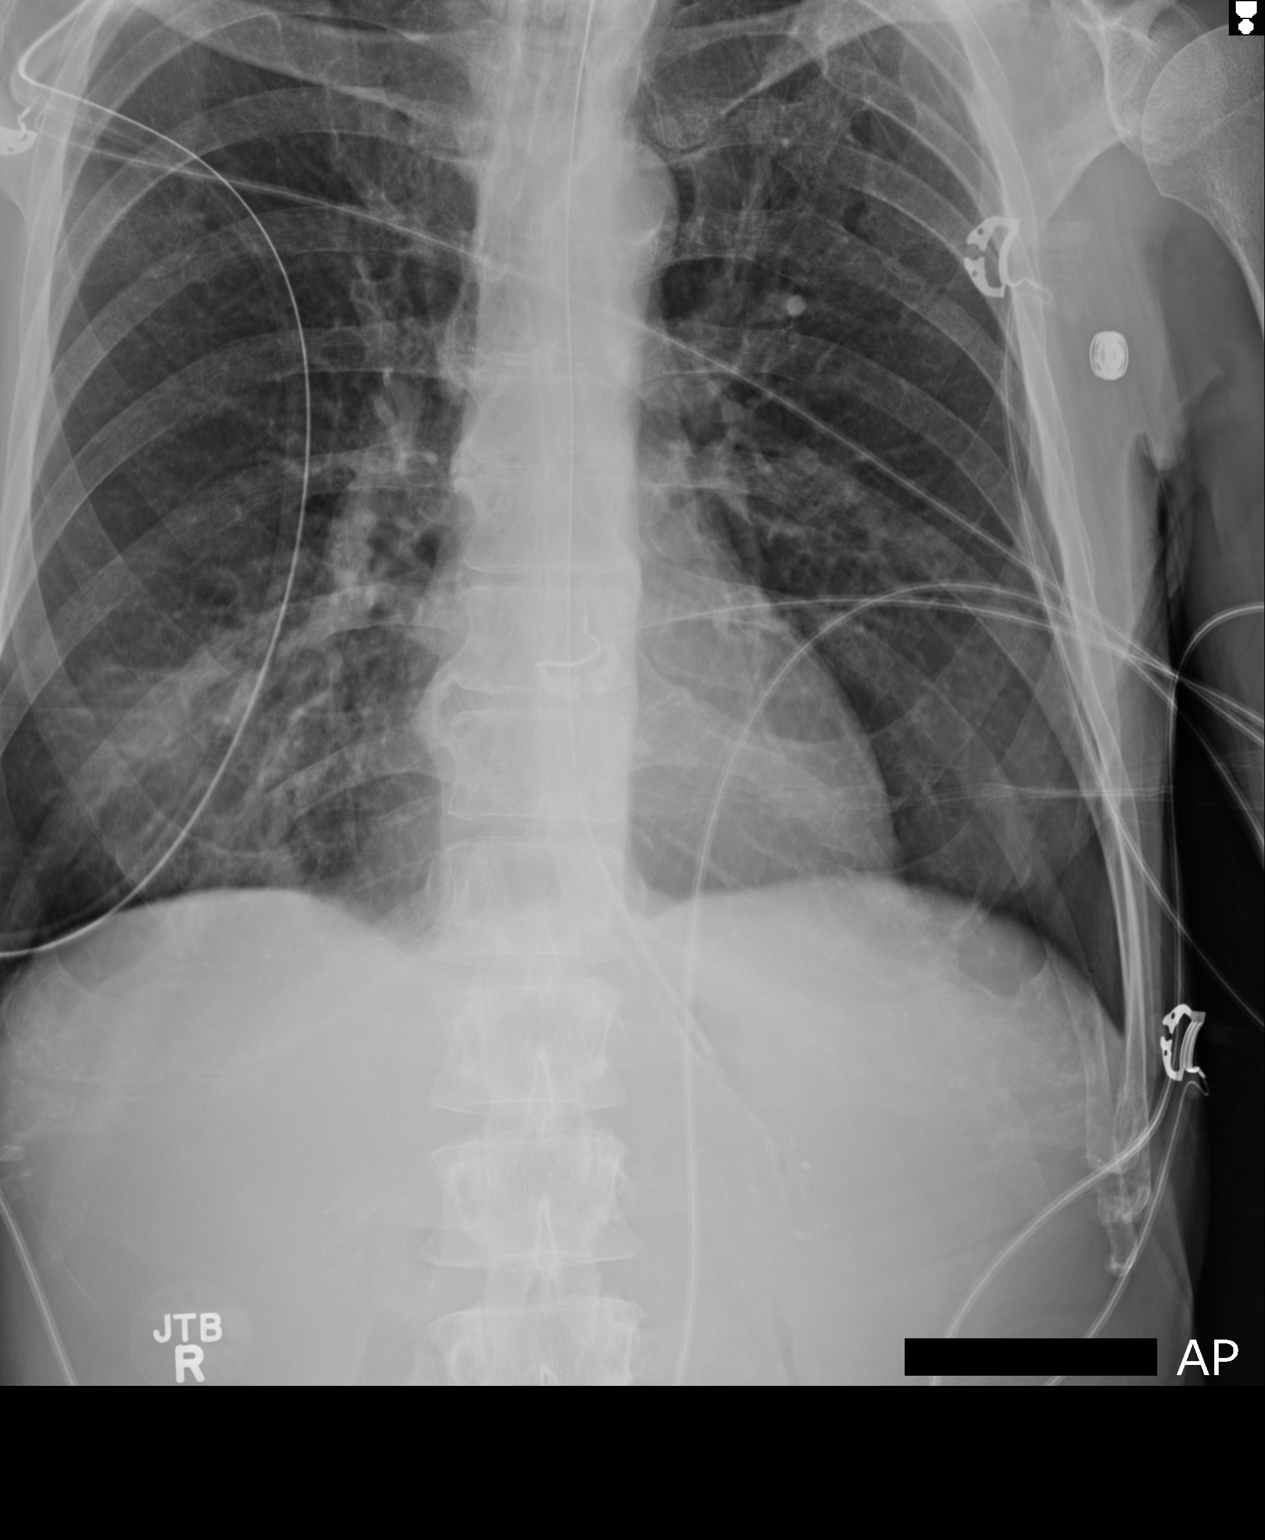

[1 of 1 positions shown; findings below may reference images not displayed]

FINDINGS: Enteric tube is present with the tip in the proximal stomach. The
side-port is in the distal esophagus. This should be advanced at
least 7 cm to prevent gastroesophageal reflux. Airspace disease in
the RIGHT middle lobe is again noted.
IMPRESSION: Enteric tube should be advanced at least 7 cm to prevent
gastroesophageal reflux.

## 2016-03-31 IMAGING — CT CT HEAD W/O CM
1 series · 15 of 30 positions shown, 19 images · non-contrast
Comparison: Head CT 12/04/2013

CLINICAL DATA: Brain abscess status post aspiration

EXAM:
CT HEAD WITHOUT CONTRAST
TECHNIQUE: Contiguous axial images were obtained from the base of the skull
through the vertex without intravenous contrast.

[Series 2: head 5.0 h30s · axial · 0.47mm/px · z∈[-124,+11]mm · 15 of 31 slices shown, 19 images]
[im 2/31  brain]
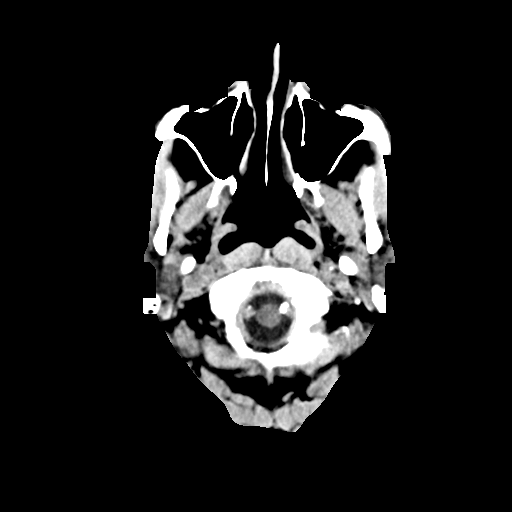
[im 2/31  bone]
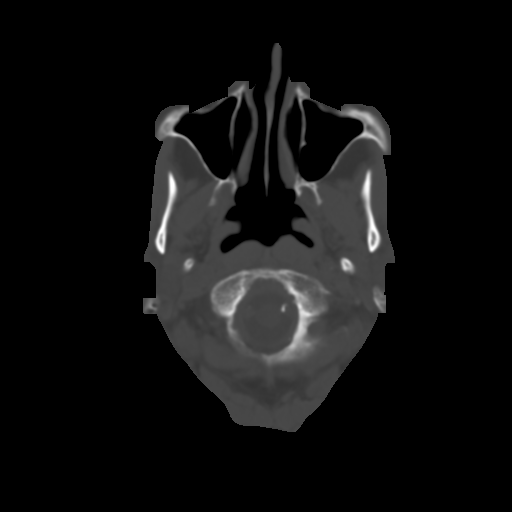
[im 4/31  brain]
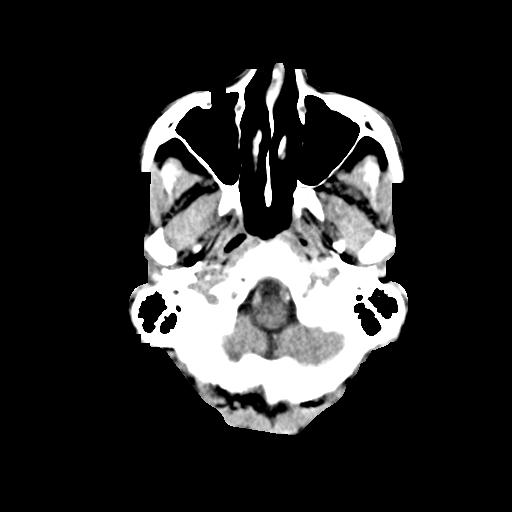
[im 6/31  brain]
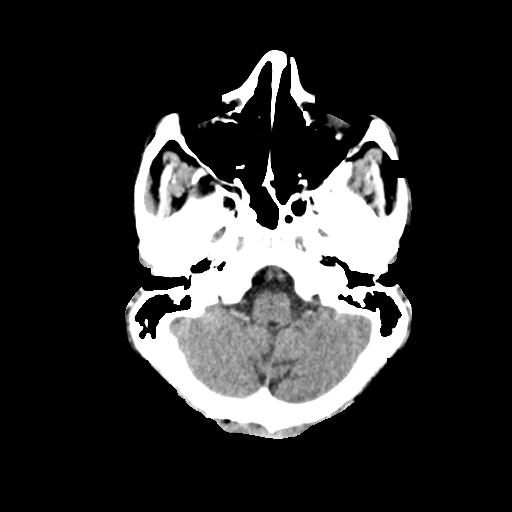
[im 8/31  brain]
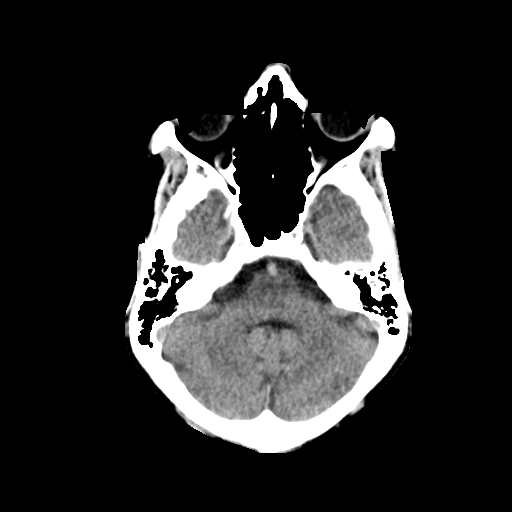
[im 10/31  brain]
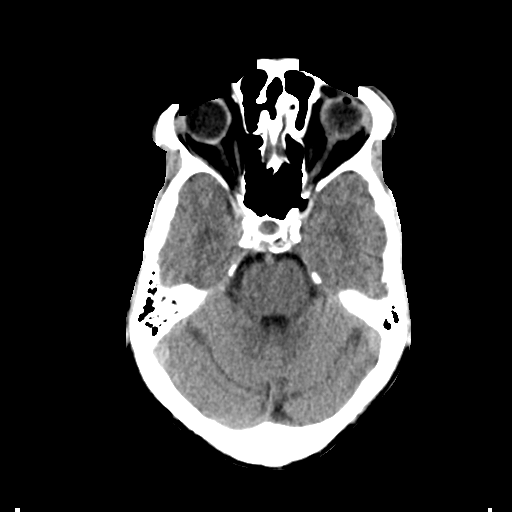
[im 10/31  bone]
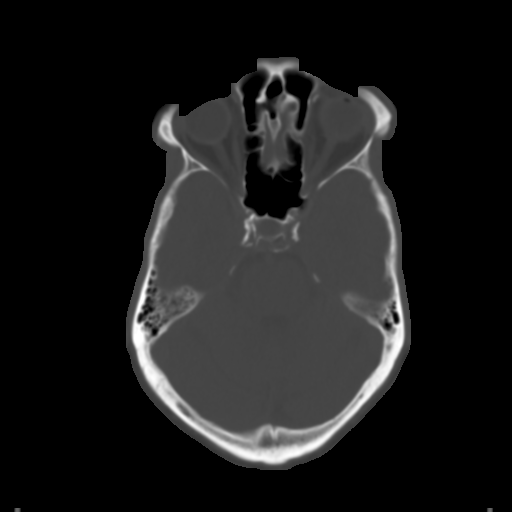
[im 12/31  brain]
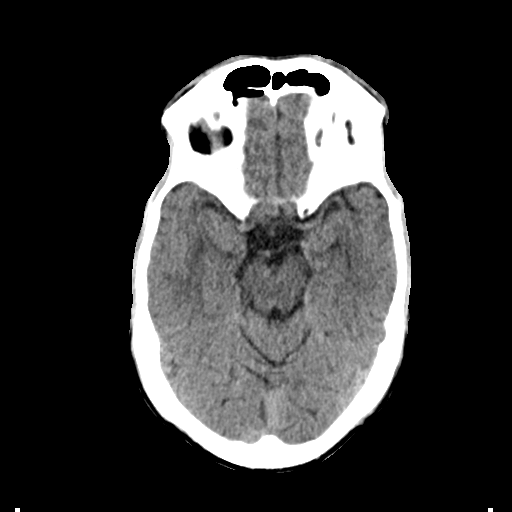
[im 14/31  brain]
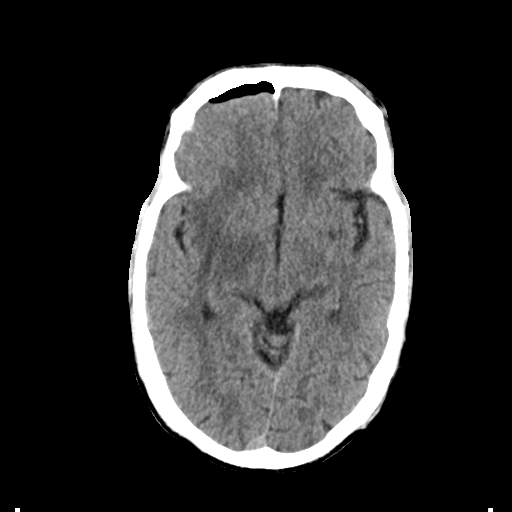
[im 16/31  brain]
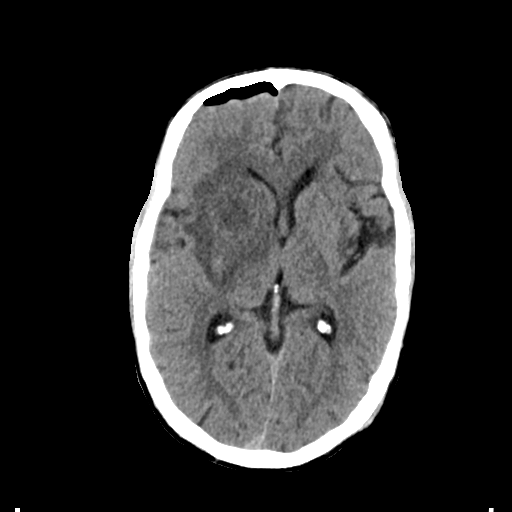
[im 17/31  brain]
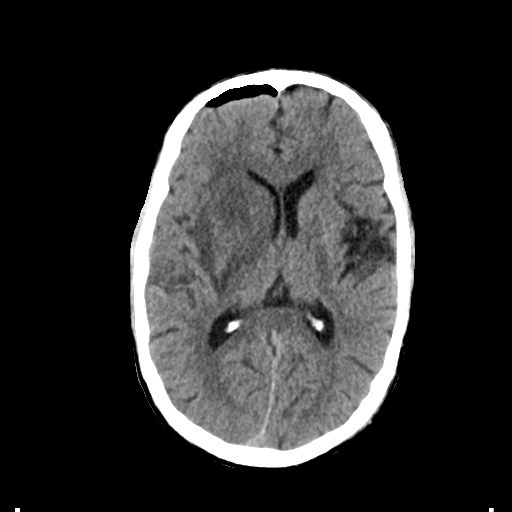
[im 17/31  bone]
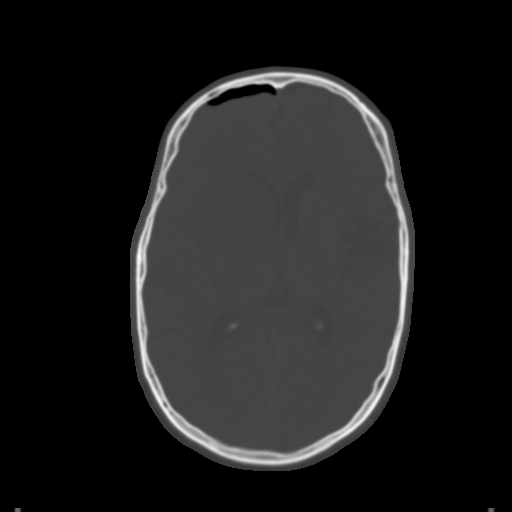
[im 19/31  brain]
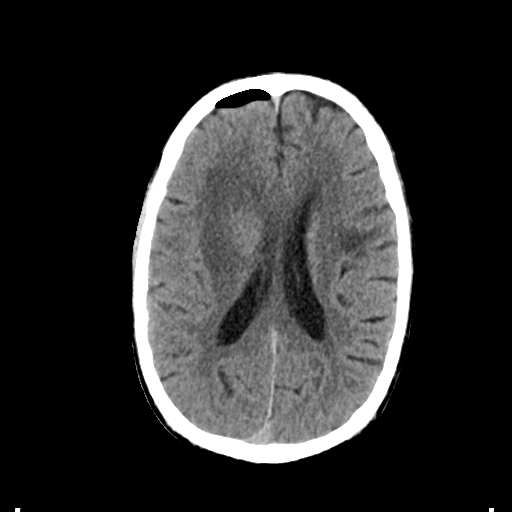
[im 21/31  brain]
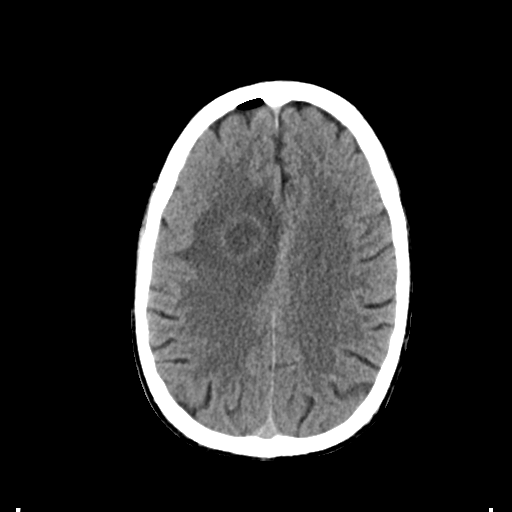
[im 23/31  brain]
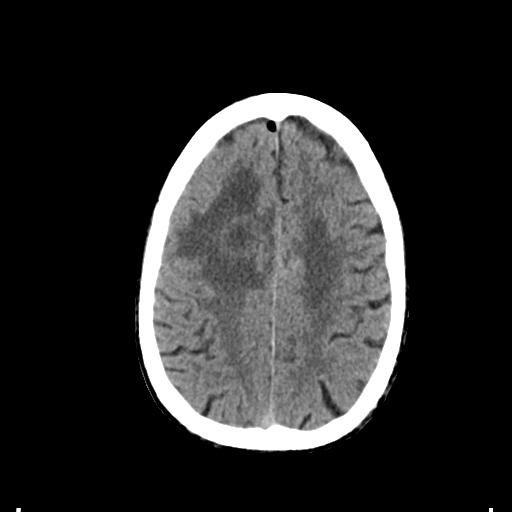
[im 25/31  brain]
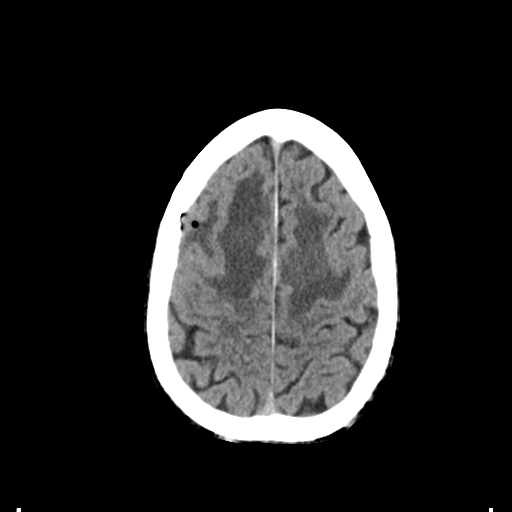
[im 25/31  bone]
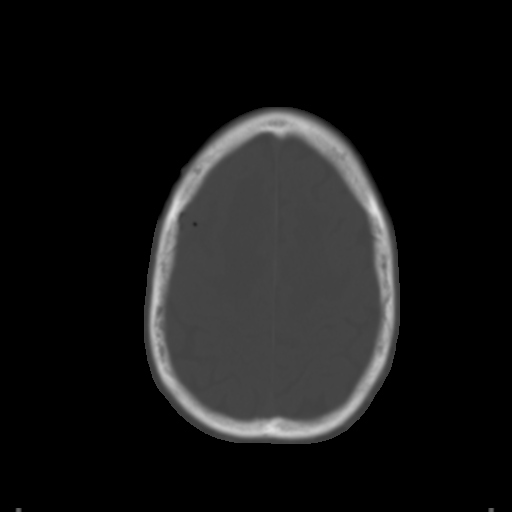
[im 27/31  brain]
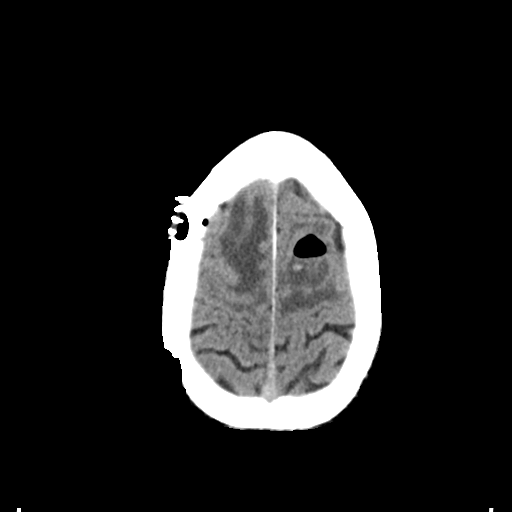
[im 29/31  brain]
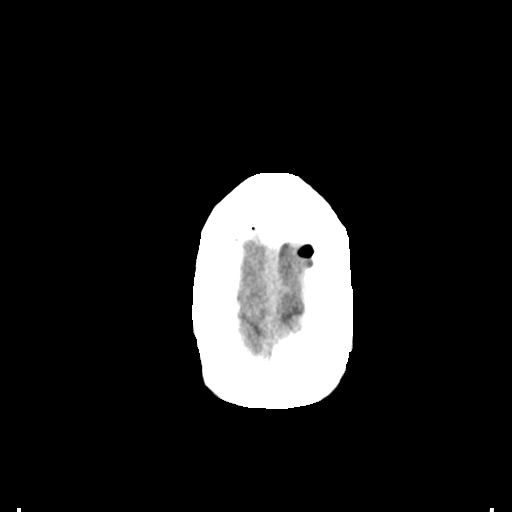

[15 of 30 positions shown; findings below may reference images not displayed]

FINDINGS: New bifrontal bur holes for surgical access. No acute osseous
findings.

No mastoid or sinus opacification.

Three brain abscesses status post decompression:

*A high right frontal white matter abscess is decompressed,
currently 23 mm in diameter, previously 27 mm.
*An abscess centered in the right anterior putamen is also smaller,
currently 28 mm in diameter, previously 38mm. There is an associated
decrease in mass effect on the frontal horn of the wall right
lateral ventricle and decrease in edema.
*A peripheral high and posterior left frontal abscess is stable at
25 mm. This cavity contains gas, fluid, and a tiny focus of
postoperative hemorrhage.

Mild decreased in vasogenic edema. Leftward shift it is currently 4
mm. No evidence of intraventricular debris or hydrocephalus. No
acute infarct. Postoperative pneumocephalus.
IMPRESSION: 1. Three cerebral abscesses status post decompression. Vasogenic
edema and mass effect is improving.
2. Tiny postoperative hemorrhage in the left frontal cavity.
3. No ventriculomegaly or ventricular debris.

## 2016-04-03 IMAGING — CR DG ABD PORTABLE 1V
1 series · 1 of 1 positions shown · non-contrast
Comparison: Prior abdominal radiograph 12/06/2013

CLINICAL DATA: Evaluate feeding tube position

EXAM:
PORTABLE ABDOMEN - 1 VIEW

[AP]
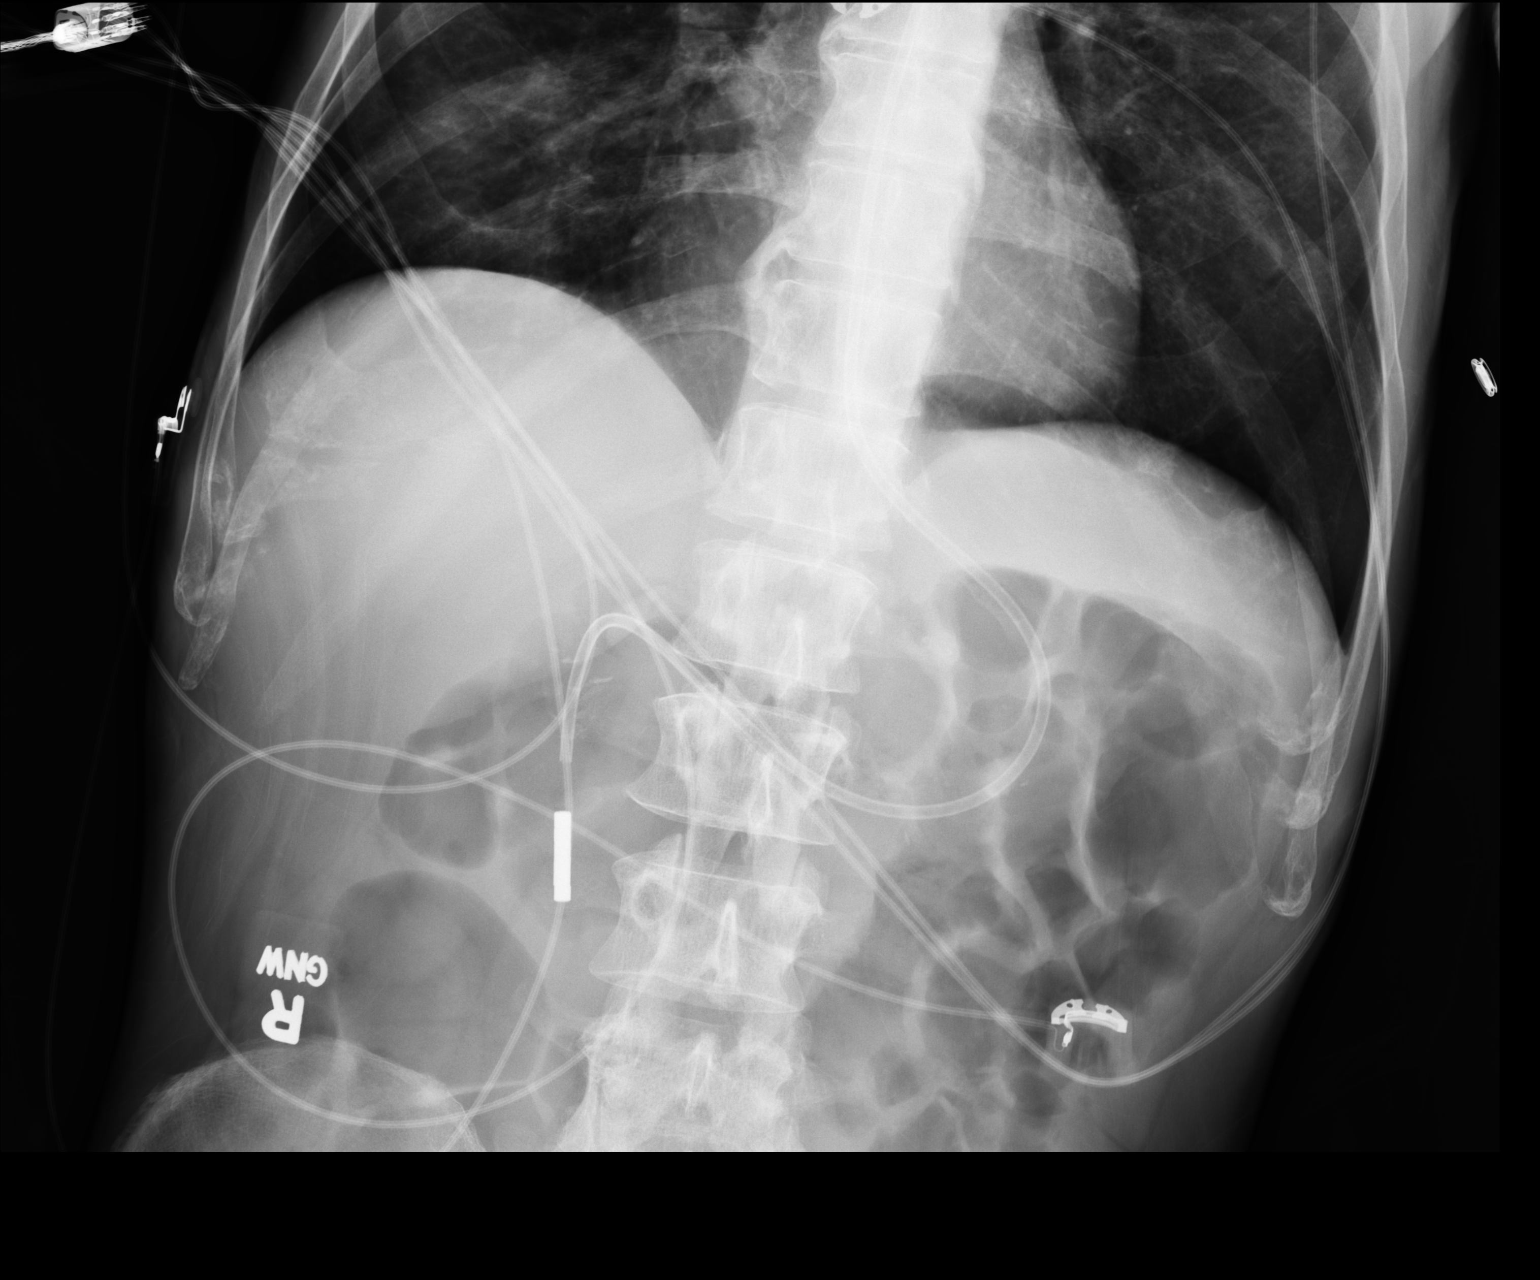

[1 of 1 positions shown; findings below may reference images not displayed]

FINDINGS: The distal progression of the weighted tip enteric feeding tube. The
tip of the tube is now in the descending duodenum. No evidence of
bowel obstruction. The lung bases are clear. Osseous structures are
unremarkable.
IMPRESSION: 1. The weighted tip of the enteric feeding tube now projects over
the descending duodenum.
2. No evidence of bowel obstruction.

## 2016-04-04 IMAGING — CR DG ABD PORTABLE 1V
1 series · 1 of 1 positions shown · non-contrast
Comparison: 12/09/2013

CLINICAL DATA: Encounter for imaging study to confirm nasogastric
(NG) tube placement VPN.83 ([id number])

EXAM:
PORTABLE ABDOMEN - 1 VIEW

[AP]
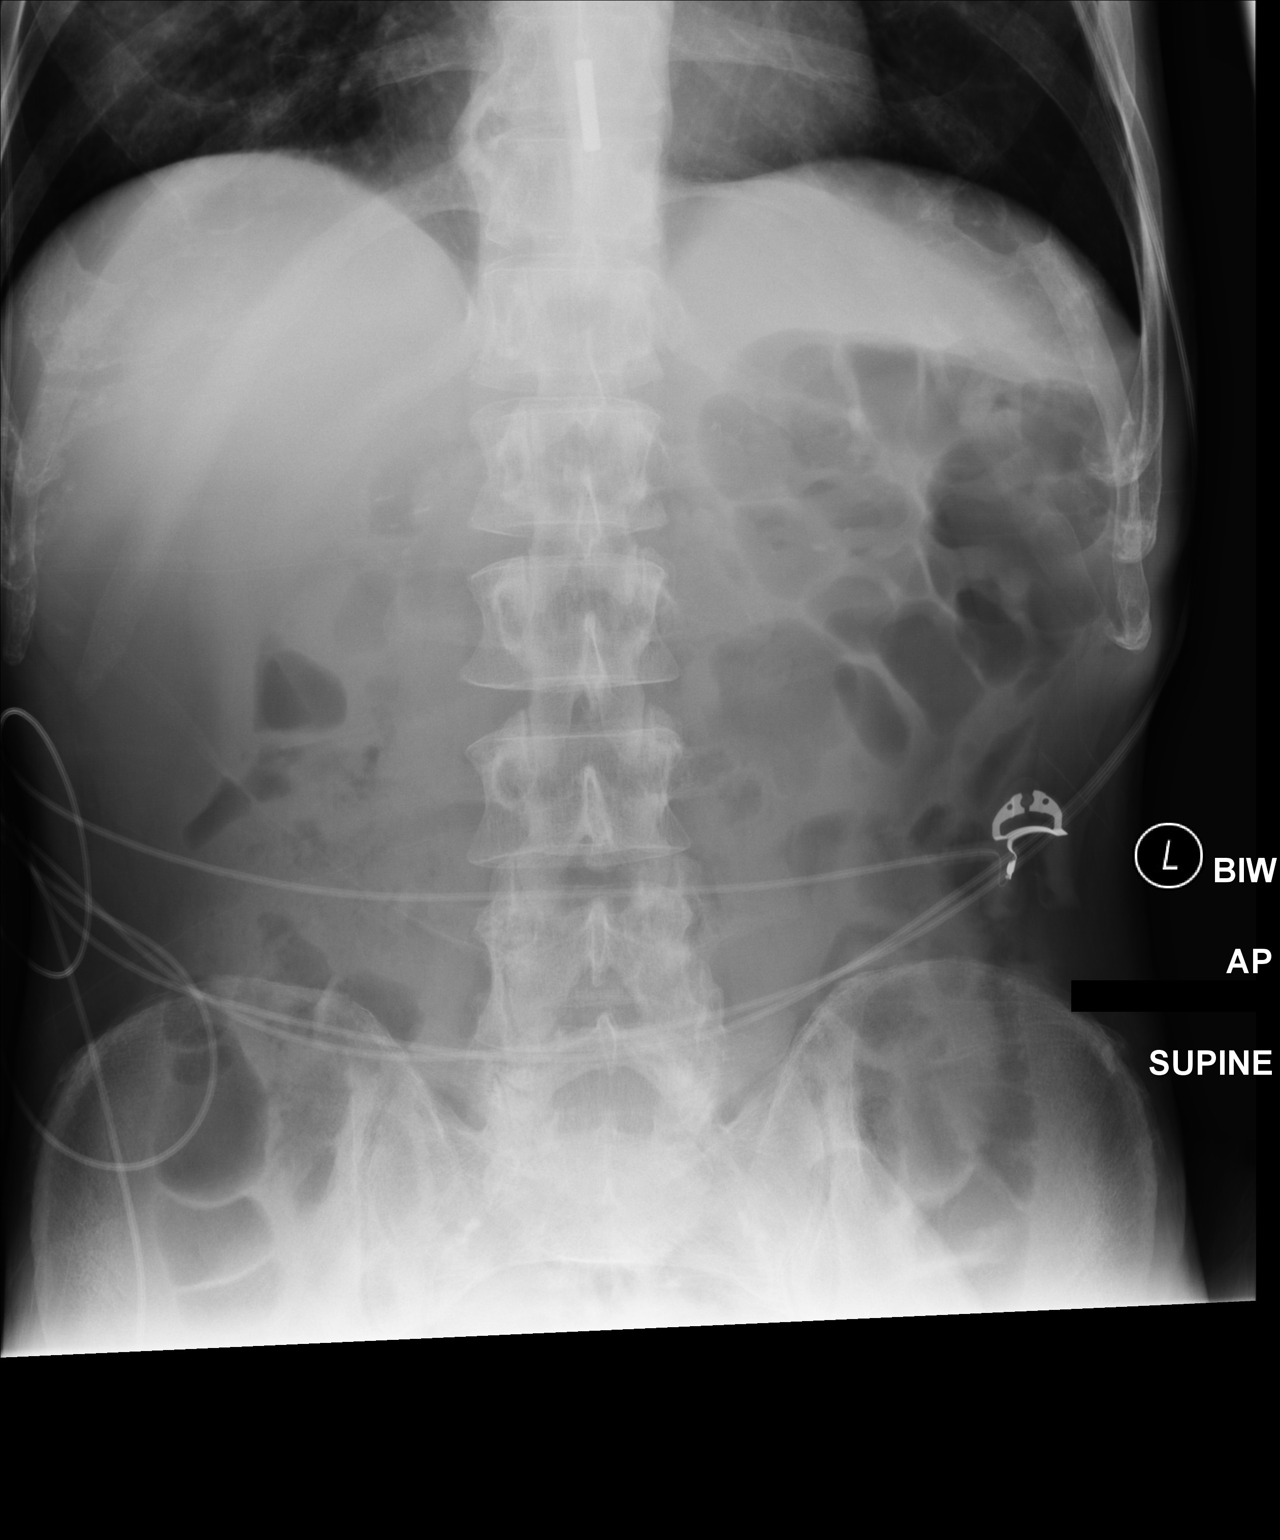

[1 of 1 positions shown; findings below may reference images not displayed]

FINDINGS: The feeding tube tip is in the lower chest, probably in the distal
esophagus. Nonspecific bowel gas pattern.
IMPRESSION: Feeding tube tip in the distal esophagus region.

## 2016-04-04 IMAGING — CR DG ABD PORTABLE 1V
1 series · 1 of 1 positions shown · non-contrast
Comparison: Earlier today.

CLINICAL DATA: Feeding tube placement. The tube has been advanced
since earlier today.

EXAM:
PORTABLE ABDOMEN - 1 VIEW

[AP]
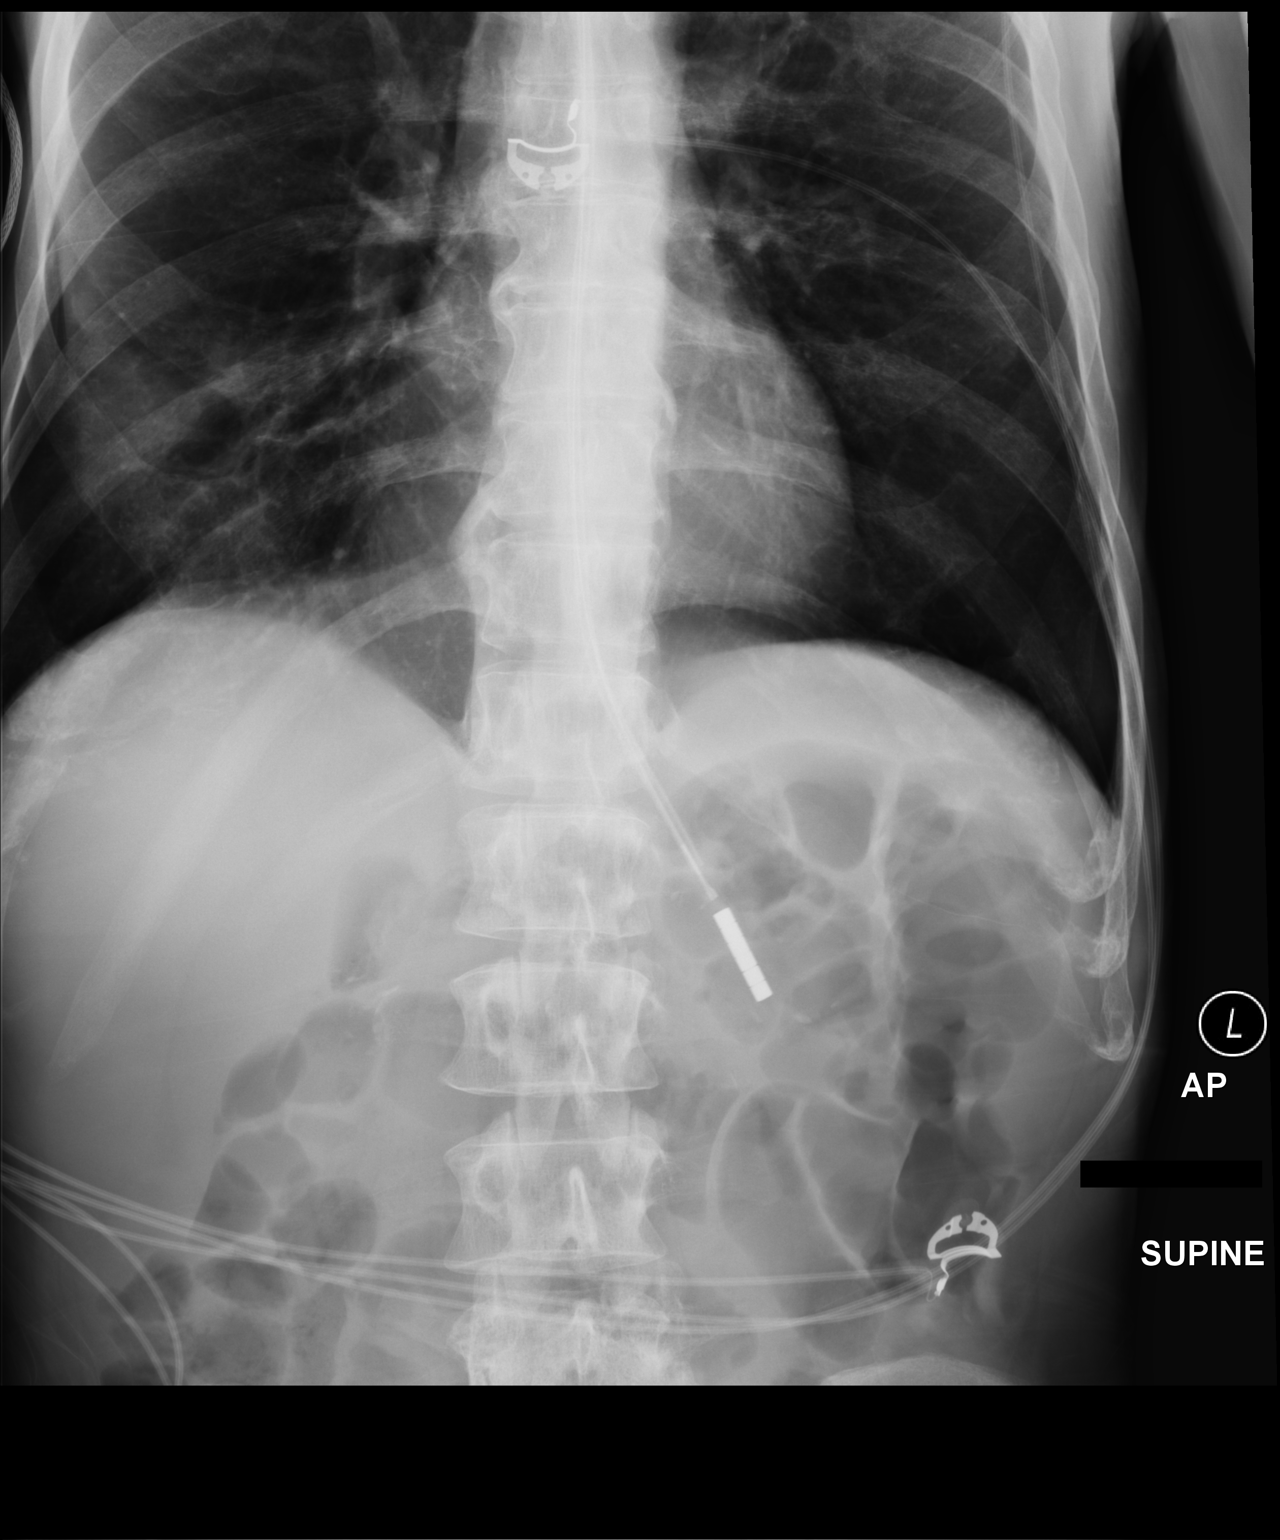

[1 of 1 positions shown; findings below may reference images not displayed]

FINDINGS: The feeding tube has been advanced with its tip in the mid stomach.
Gas-filled loops of colon and small bowel without dilatation. Mild
scoliosis. Lumbar and lower thoracic spine degenerative changes.
IMPRESSION: Feeding tube tip in the mid stomach.

## 2016-04-08 ENCOUNTER — Encounter: Payer: Self-pay | Admitting: Family

## 2016-04-08 IMAGING — XA IR PERC PLACEMENT GASTROSTOMY
1 series · 12 of 21 positions shown · non-contrast
Comparison: CT abdomen pelvis -12/02/2018

INDICATION: History of COPD, CVA and substance abuse, admitted with brain
abscess, post aspiration, now with dysphagia and failed swallowed
study. Request made for percutaneous gastrostomy tube placement for
enteric nutrition supplementation.

EXAM:
PULL TROUGH GASTROSTOMY TUBE PLACEMENT

[Series 1: run · 12 of 21 slices shown]
[im 1/21]
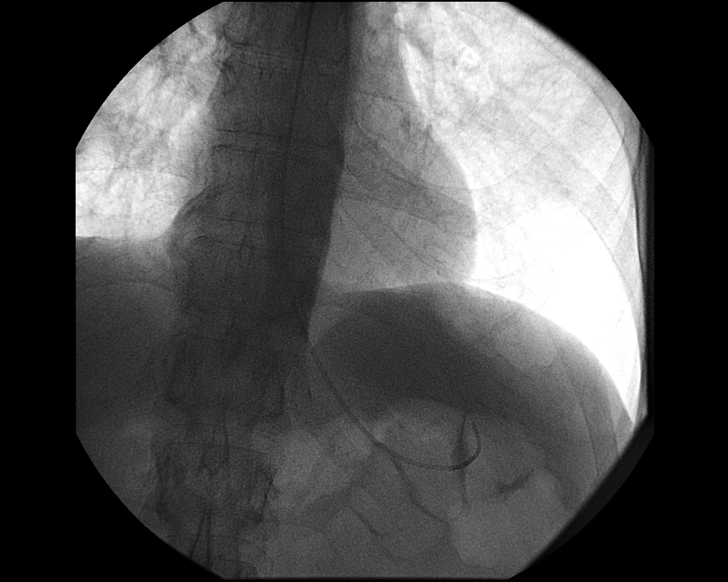
[im 3/21]
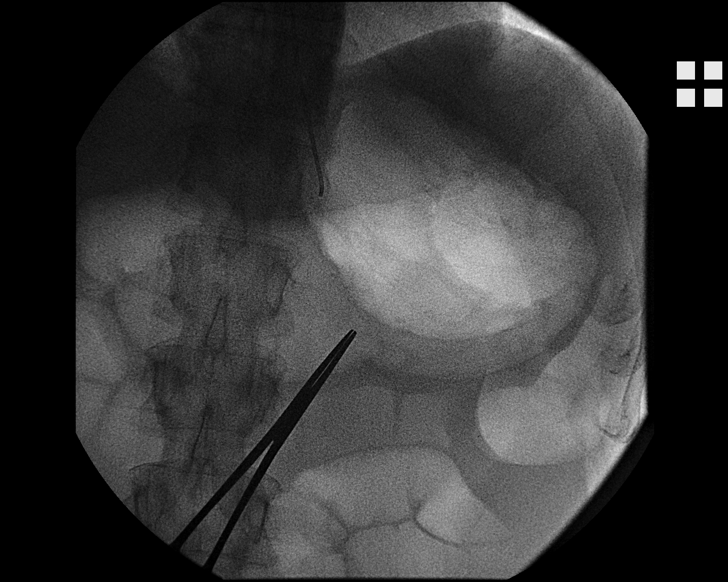
[im 5/21]
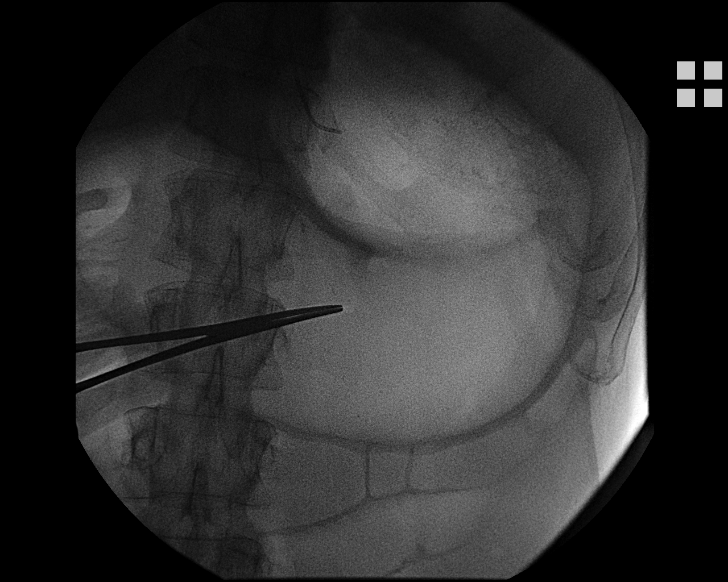
[im 7/21]
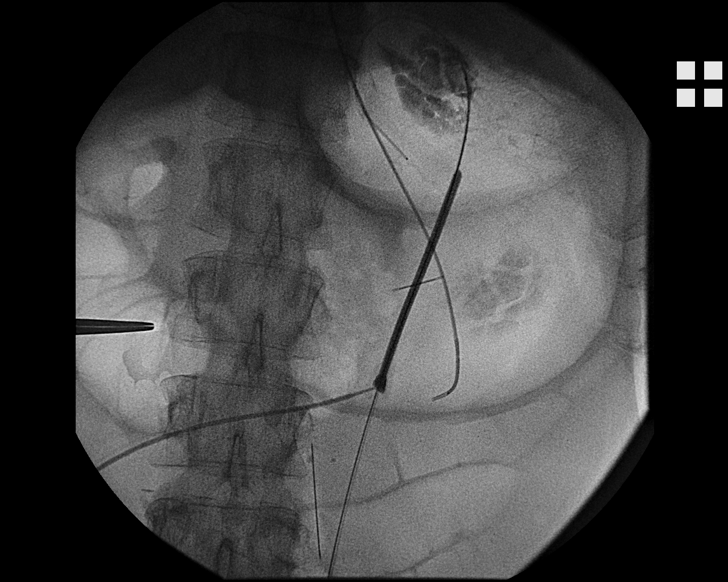
[im 8/21]
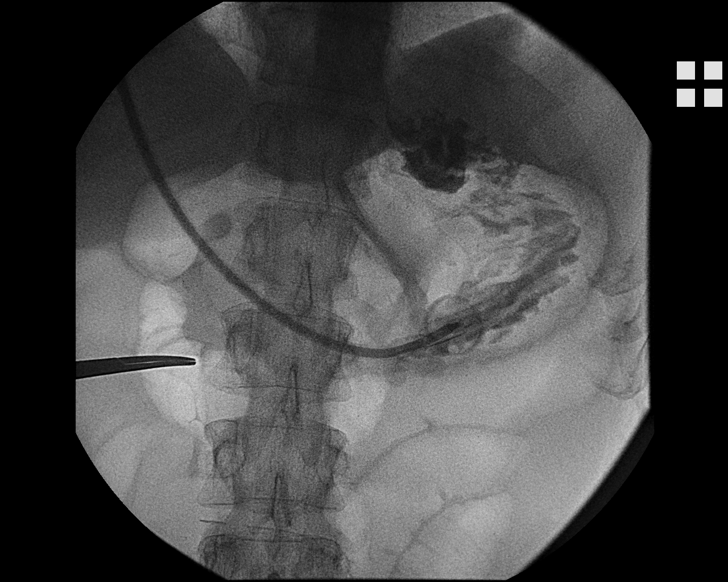
[im 10/21]
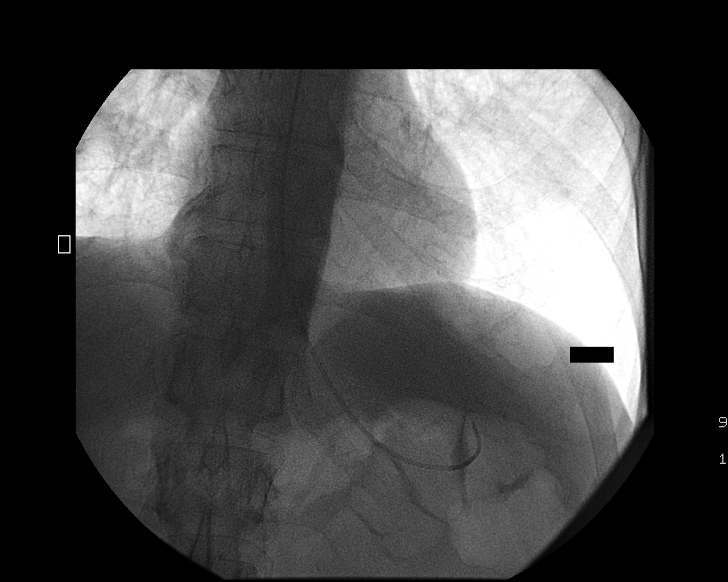
[im 12/21]
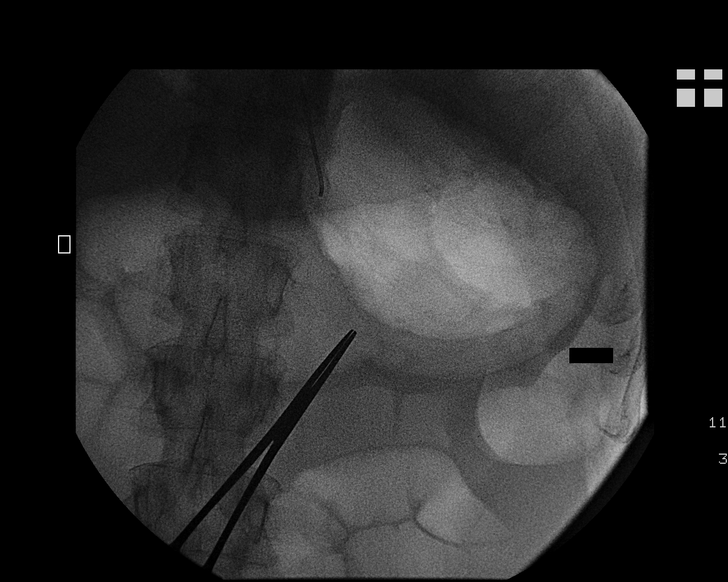
[im 14/21]
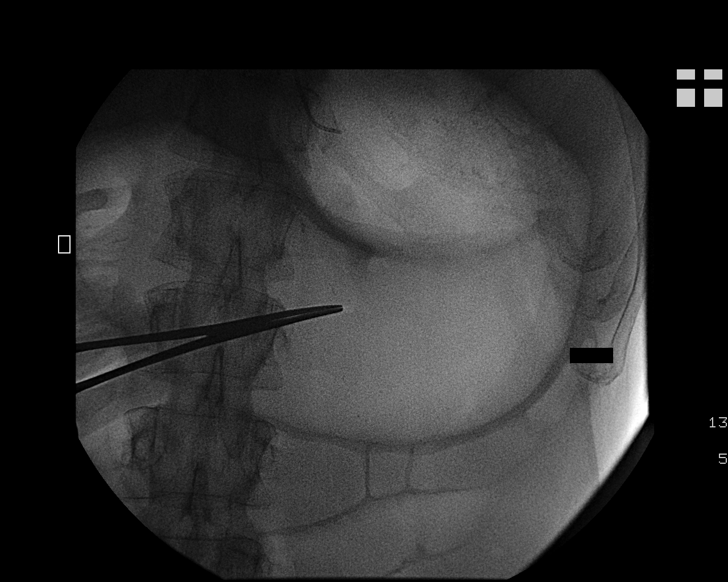
[im 15/21]
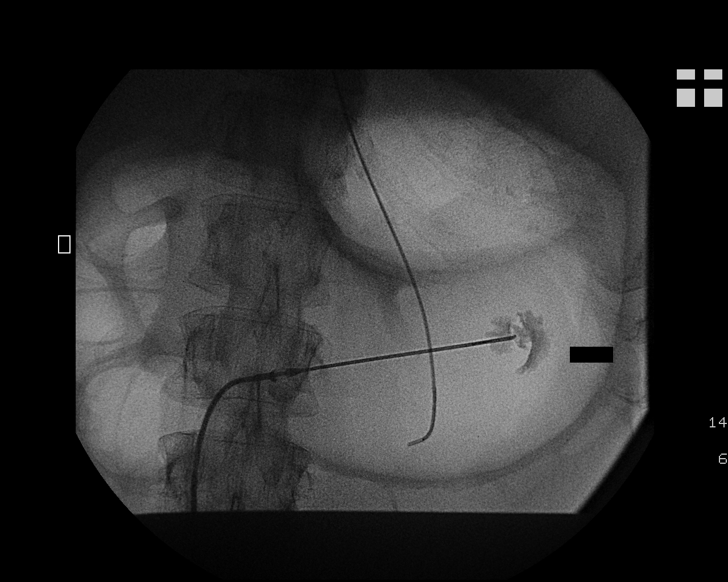
[im 17/21]
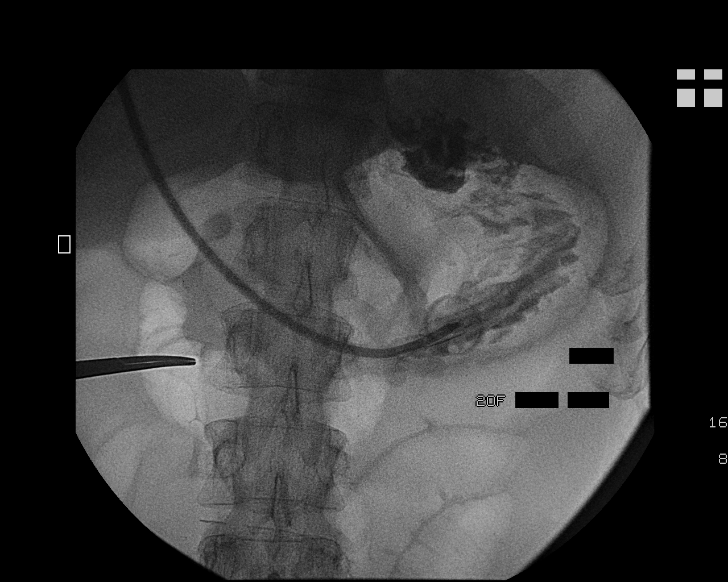
[im 19/21]
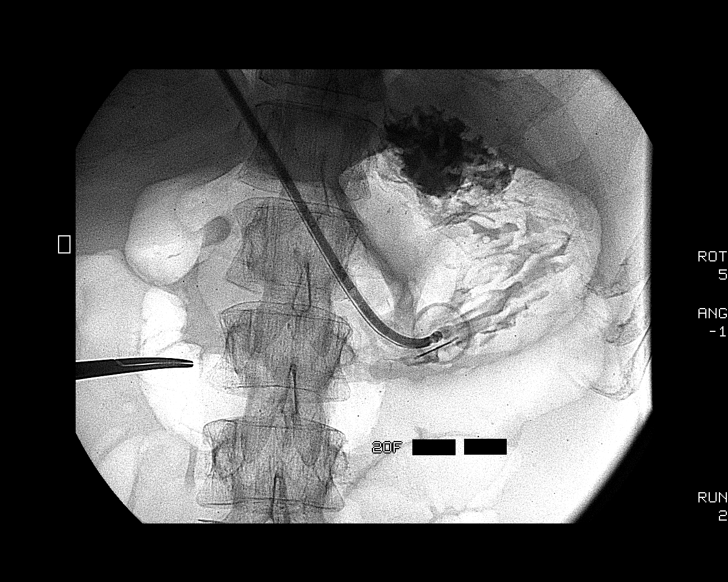
[im 21/21]
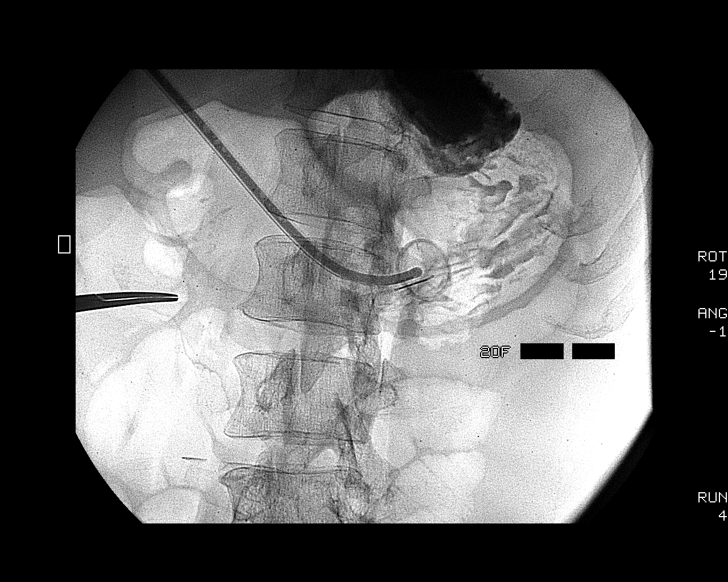

[12 of 21 positions shown; findings below may reference images not displayed]

MEDICATIONS:
Patient is currently admitted to the hospital and receiving
intravenous antibiotics. The antibiotics were administered within an
appropriate time frame prior to the initiation of the procedure.

CONTRAST:  20 mL of Isovue 300 administered into the gastric lumen.

ANESTHESIA/SEDATION:
Versed 2 mg IV; Fentanyl 100 mcg IV

Sedation time

20 minutes

FLUOROSCOPY TIME:  7 minutes 48 seconds.  (7,904 mGy)

COMPLICATIONS:
None immediate

PROCEDURE:
Informed written consent was obtained from the patient following
explanation of the procedure, risks, benefits and alternatives. A
time out was performed prior to the initiation of the procedure.
Ultrasound scanning was performed to demarcate the edge of the left
lobe of the liver. Maximal barrier sterile technique utilized
including caps, mask, sterile gowns, sterile gloves, large sterile
drape, hand hygiene and Betadine prep.

The left upper quadrant was sterilely prepped and draped. An oral
gastric catheter was inserted into the stomach under fluoroscopy.
The left costal margin and air opacified transverse colon were
identified and avoided. Air was injected into the stomach for
insufflation and visualization under fluoroscopy. Appropriate
percutaneous access site was confirmed with the acquisition of
cross-table lateral radiographic images. Under sterile conditions a
17 gauge trocar needle was utilized to access the stomach
percutaneously beneath the left subcostal margin after the overlying
soft tissues were anesthetized with 1% Lidocaine with epinephrine.
Needle position was confirmed within the stomach with aspiration of
air and injection of small amount of contrast. A single T tack was
deployed for gastropexy. Over an Amplatz guide wire, a 9-French
sheath was inserted into the stomach. A snare device was utilized to
capture the oral gastric catheter. The snare device was pulled
retrograde from the stomach up the esophagus and out the oropharynx.
The 20-French pull-through gastrostomy was connected to the snare
device and pulled antegrade through the oropharynx down the
esophagus into the stomach and then through the percutaneous tract
external to the patient. The gastrostomy was assembled externally.
Contrast injection confirms position in the stomach. Several spot
radiographic images were obtained in various obliquities for
documentation. The patient tolerated procedure well without
immediate post procedural complication.
FINDINGS: After successful fluoroscopic guided placement, the gastrostomy tube
is appropriately positioned with internal disc against the ventral
aspect of the gastric lumen.
IMPRESSION: Successful fluoroscopic insertion of a 20-French pull-through
gastrostomy tube.

The gastrostomy may be used immediately for medication
administration and in 24 hrs for the initiation of feeds.

## 2016-04-14 ENCOUNTER — Other Ambulatory Visit: Payer: Self-pay | Admitting: *Deleted

## 2016-04-14 DIAGNOSIS — I739 Peripheral vascular disease, unspecified: Secondary | ICD-10-CM

## 2016-04-14 MED ORDER — CLOPIDOGREL BISULFATE 75 MG PO TABS: 75.0000 mg | ORAL_TABLET | Freq: Every day | ORAL | 2 refills | Status: DC

## 2016-04-20 ENCOUNTER — Ambulatory Visit: Payer: Medicaid Other | Admitting: Family

## 2016-04-24 ENCOUNTER — Telehealth: Payer: Self-pay | Admitting: Family

## 2016-04-24 ENCOUNTER — Ambulatory Visit (INDEPENDENT_AMBULATORY_CARE_PROVIDER_SITE_OTHER): Payer: Medicaid Other | Admitting: Family

## 2016-04-24 ENCOUNTER — Encounter: Payer: Self-pay | Admitting: Family

## 2016-04-24 VITALS — BP 120/68 | HR 97 | Temp 97.7°F | Resp 28 | Ht 73.0 in | Wt 175.0 lb

## 2016-04-24 DIAGNOSIS — F17200 Nicotine dependence, unspecified, uncomplicated: Secondary | ICD-10-CM | POA: Diagnosis not present

## 2016-04-24 DIAGNOSIS — Z95828 Presence of other vascular implants and grafts: Secondary | ICD-10-CM

## 2016-04-24 DIAGNOSIS — I771 Stricture of artery: Secondary | ICD-10-CM

## 2016-04-24 DIAGNOSIS — I779 Disorder of arteries and arterioles, unspecified: Secondary | ICD-10-CM

## 2016-04-24 NOTE — Telephone Encounter (Signed)
Scheduled 4/27 @ 9:30, confirmed with pt.
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
## 2016-04-24 NOTE — Patient Instructions (Signed)
Peripheral Vascular Disease Peripheral vascular disease (PVD) is a disease of the blood vessels that are not part of your heart and brain. A simple term for PVD is poor circulation. In most cases, PVD narrows the blood vessels that carry blood from your heart to the rest of your body. This can result in a decreased supply of blood to your arms, legs, and internal organs, like your stomach or kidneys. However, it most often affects a person's lower legs and feet. There are two types of PVD.  Organic PVD. This is the more common type. It is caused by damage to the structure of blood vessels.  Functional PVD. This is caused by conditions that make blood vessels contract and tighten (spasm). Without treatment, PVD tends to get worse over time. PVD can also lead to acute ischemic limb. This is when an arm or limb suddenly has trouble getting enough blood. This is a medical emergency. Follow these instructions at home:  Take medicines only as told by your doctor.  Do not use any tobacco products, including cigarettes, chewing tobacco, or electronic cigarettes. If you need help quitting, ask your doctor.  Lose weight if you are overweight, and maintain a healthy weight as told by your doctor.  Eat a diet that is low in fat and cholesterol. If you need help, ask your doctor.  Exercise regularly. Ask your doctor for some good activities for you.  Take good care of your feet.  Wear comfortable shoes that fit well.  Check your feet often for any cuts or sores. Contact a doctor if:  You have cramps in your legs while walking.  You have leg pain when you are at rest.  You have coldness in a leg or foot.  Your skin changes.  You are unable to get or have an erection (erectile dysfunction).  You have cuts or sores on your feet that are not healing. Get help right away if:  Your arm or leg turns cold and blue.  Your arms or legs become red, warm, swollen, painful, or numb.  You have  chest pain or trouble breathing.  You suddenly have weakness in your face, arm, or leg.  You become very confused or you cannot speak.  You suddenly have a very bad headache.  You suddenly cannot see. This information is not intended to replace advice given to you by your health care provider. Make sure you discuss any questions you have with your health care provider. Document Released: 03/25/2009 Document Revised: 06/06/2015 Document Reviewed: 06/08/2013 Elsevier Interactive Patient Education  2017 Elsevier Inc.      Steps to Quit Smoking Smoking tobacco can be bad for your health. It can also affect almost every organ in your body. Smoking puts you and people around you at risk for many serious long-lasting (chronic) diseases. Quitting smoking is hard, but it is one of the best things that you can do for your health. It is never too late to quit. What are the benefits of quitting smoking? When you quit smoking, you lower your risk for getting serious diseases and conditions. They can include:  Lung cancer or lung disease.  Heart disease.  Stroke.  Heart attack.  Not being able to have children (infertility).  Weak bones (osteoporosis) and broken bones (fractures). If you have coughing, wheezing, and shortness of breath, those symptoms may get better when you quit. You may also get sick less often. If you are pregnant, quitting smoking can help to lower your chances   of having a baby of low birth weight. What can I do to help me quit smoking? Talk with your doctor about what can help you quit smoking. Some things you can do (strategies) include:  Quitting smoking totally, instead of slowly cutting back how much you smoke over a period of time.  Going to in-person counseling. You are more likely to quit if you go to many counseling sessions.  Using resources and support systems, such as:  Online chats with a counselor.  Phone quitlines.  Printed self-help  materials.  Support groups or group counseling.  Text messaging programs.  Mobile phone apps or applications.  Taking medicines. Some of these medicines may have nicotine in them. If you are pregnant or breastfeeding, do not take any medicines to quit smoking unless your doctor says it is okay. Talk with your doctor about counseling or other things that can help you. Talk with your doctor about using more than one strategy at the same time, such as taking medicines while you are also going to in-person counseling. This can help make quitting easier. What things can I do to make it easier to quit? Quitting smoking might feel very hard at first, but there is a lot that you can do to make it easier. Take these steps:  Talk to your family and friends. Ask them to support and encourage you.  Call phone quitlines, reach out to support groups, or work with a counselor.  Ask people who smoke to not smoke around you.  Avoid places that make you want (trigger) to smoke, such as:  Bars.  Parties.  Smoke-break areas at work.  Spend time with people who do not smoke.  Lower the stress in your life. Stress can make you want to smoke. Try these things to help your stress:  Getting regular exercise.  Deep-breathing exercises.  Yoga.  Meditating.  Doing a body scan. To do this, close your eyes, focus on one area of your body at a time from head to toe, and notice which parts of your body are tense. Try to relax the muscles in those areas.  Download or buy apps on your mobile phone or tablet that can help you stick to your quit plan. There are many free apps, such as QuitGuide from the CDC (Centers for Disease Control and Prevention). You can find more support from smokefree.gov and other websites. This information is not intended to replace advice given to you by your health care provider. Make sure you discuss any questions you have with your health care provider. Document Released:  10/25/2008 Document Revised: 08/27/2015 Document Reviewed: 05/15/2014 Elsevier Interactive Patient Education  2017 Elsevier Inc.  

## 2016-04-26 IMAGING — CR DG CHEST 1V PORT
1 series · 1 of 1 positions shown · non-contrast
Comparison: 11/30/2013

CLINICAL DATA: Chest pain, right middle lobe pneumonia November 2013

EXAM:
PORTABLE CHEST - 1 VIEW

[portable]
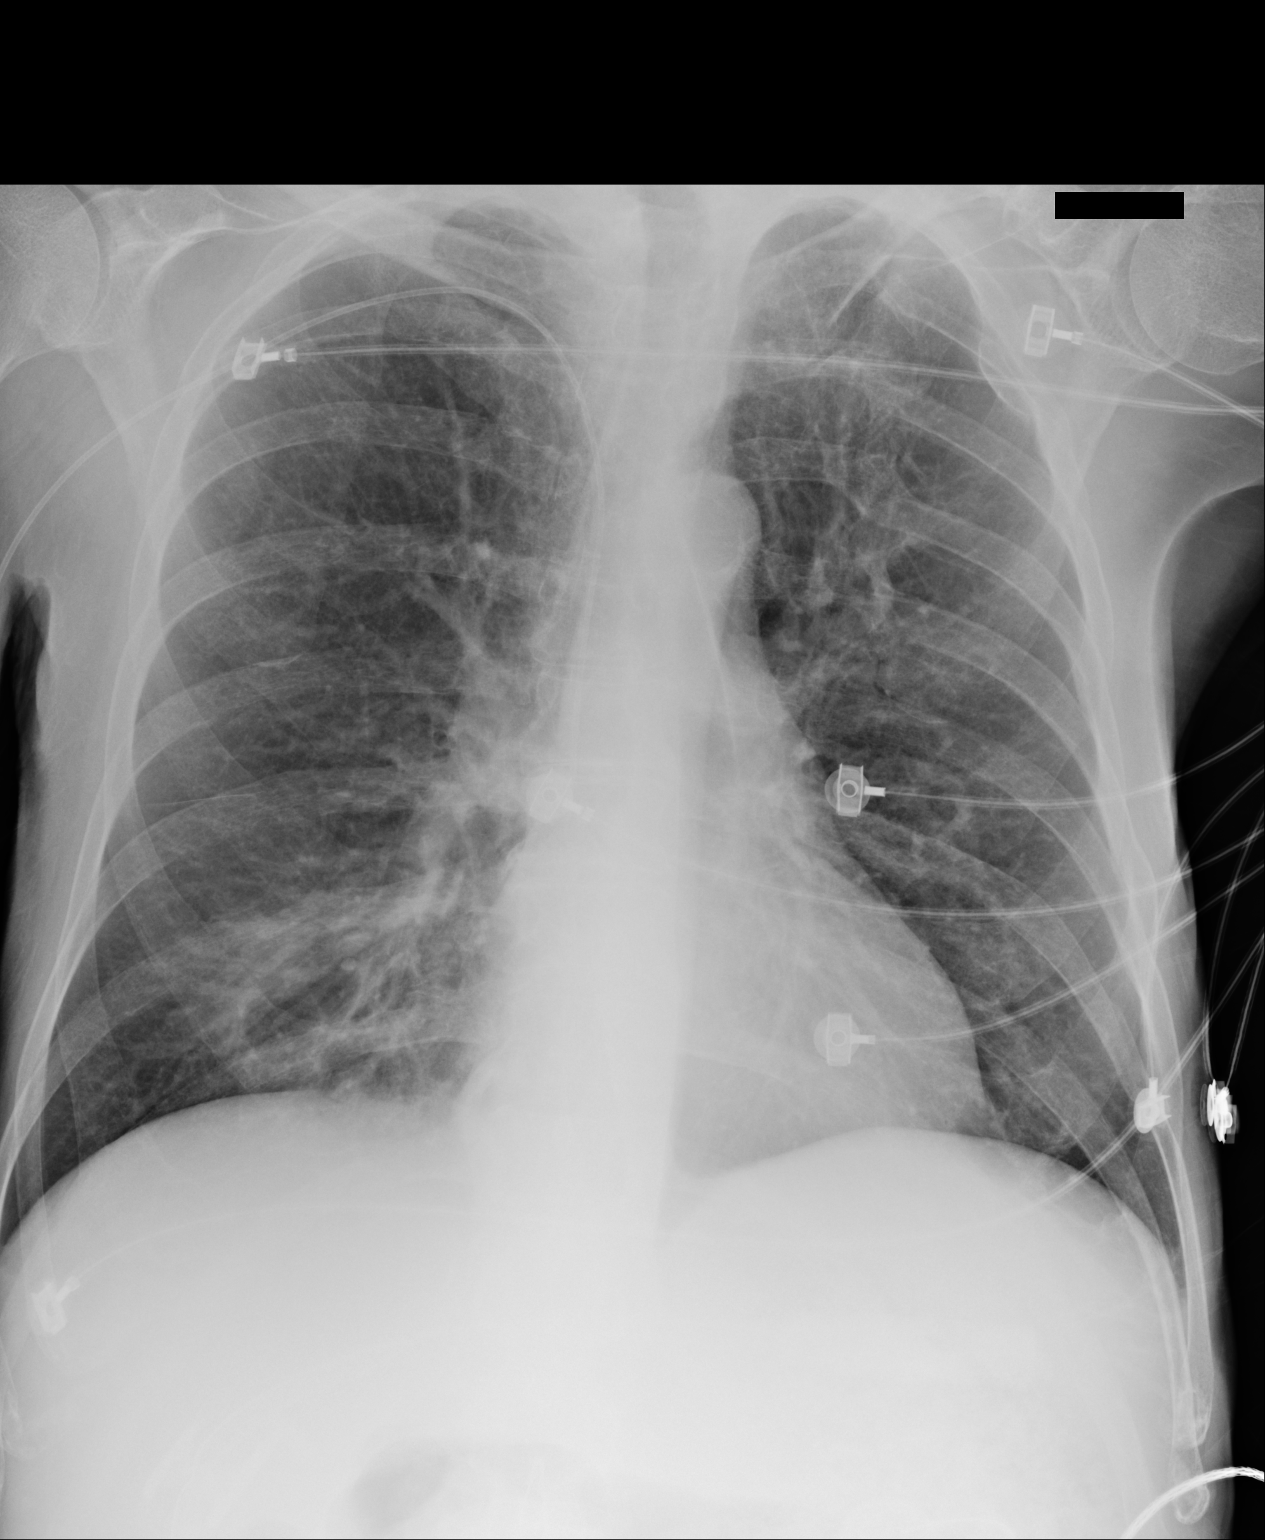

[1 of 1 positions shown; findings below may reference images not displayed]

FINDINGS: There is near complete resolution of the previously seen right
middle lobe airspace opacity with central lucency. Right-sided PICC
line in place with tip over the cavoatrial junction. Heart size is
normal. Left lung is clear. No acute osseous finding. Cervical
fusion hardware partly visualized.
IMPRESSION: Near complete resolution of right middle lobe airspace opacity
compatible with previously diagnosed pneumonia. Radiographic
resolution may be prolonged in patients with underlying lung
disease. Follow-up chest radiograph is recommended in 4 weeks. No
new acute abnormality.

## 2016-04-27 IMAGING — US US EXTREM LOW VENOUS BILAT
1 series · 13 of 24 positions shown · non-contrast
Comparison: [DATE] chest CTA

CLINICAL DATA: Small bilateral pulmonary emboli by CTA earlier
today.



[Series 1: us extrem low venous bilat · 0.08mm/px · 13 of 59 slices shown]
[im 1/59]
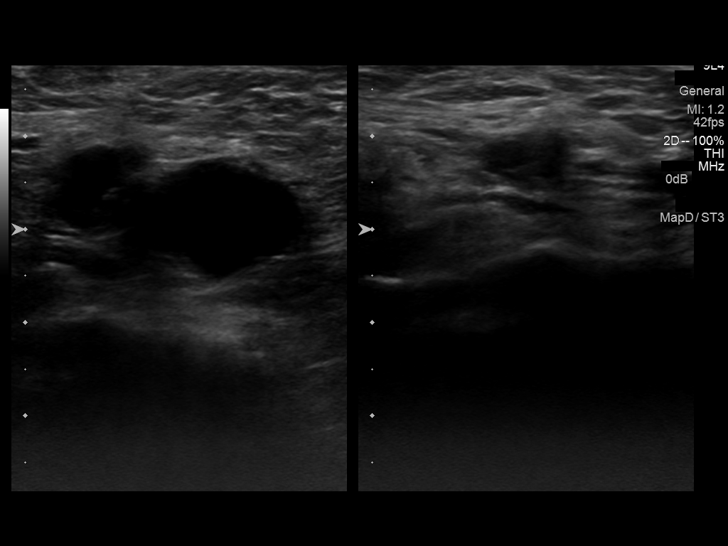
[im 6/59]
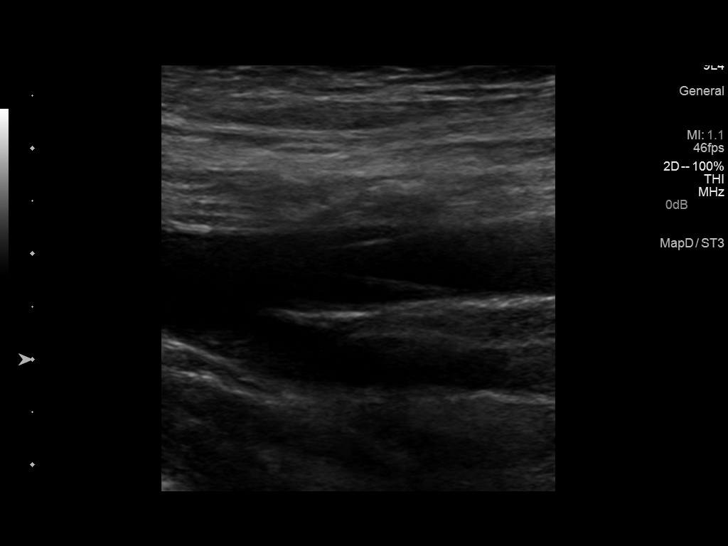
[im 11/59]
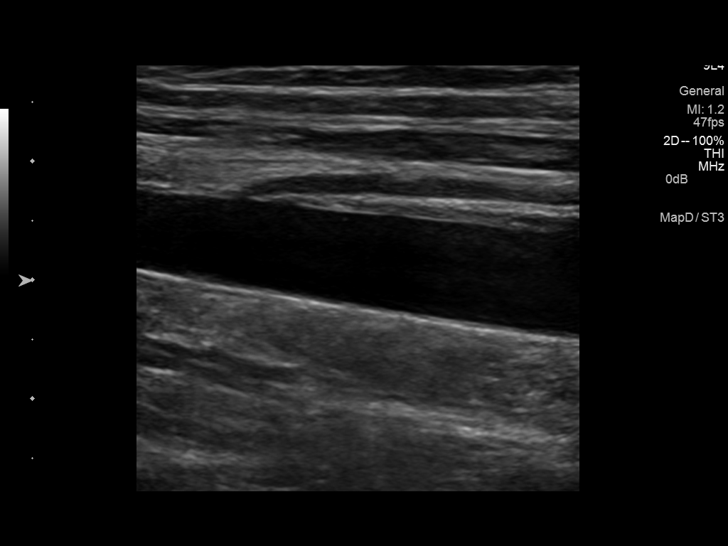
[im 16/59]
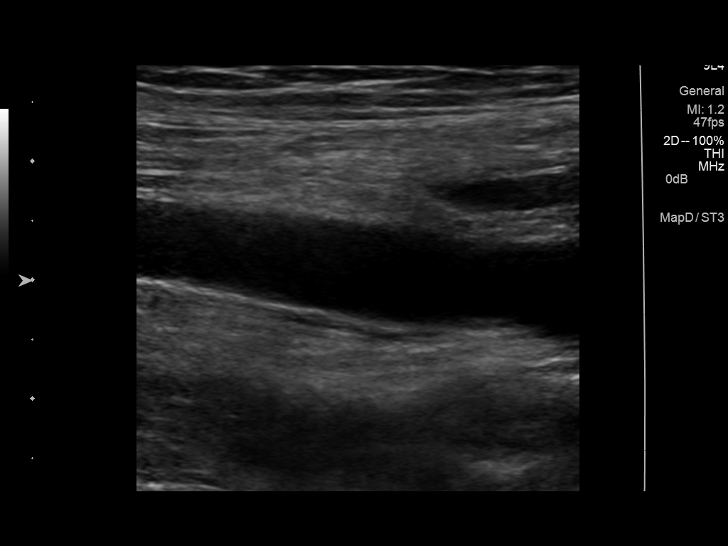
[im 21/59]
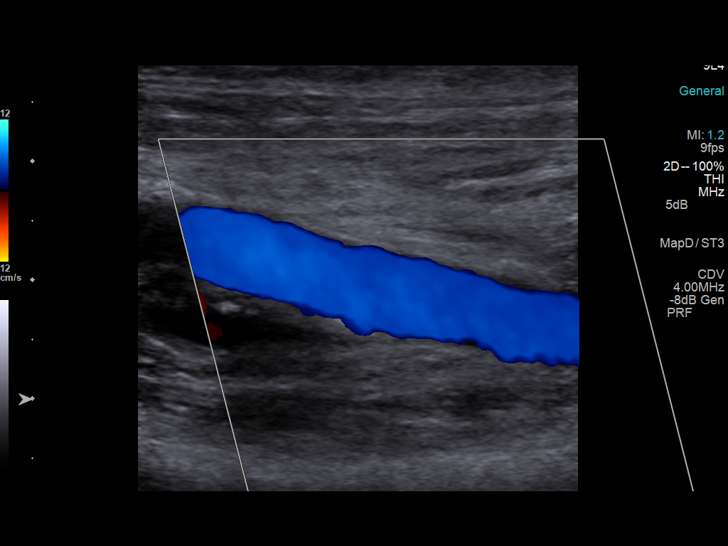
[im 26/59]
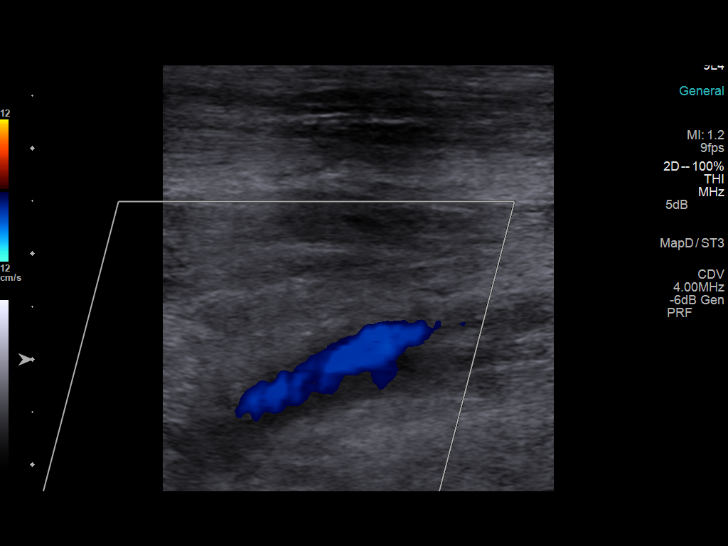
[im 31/59]
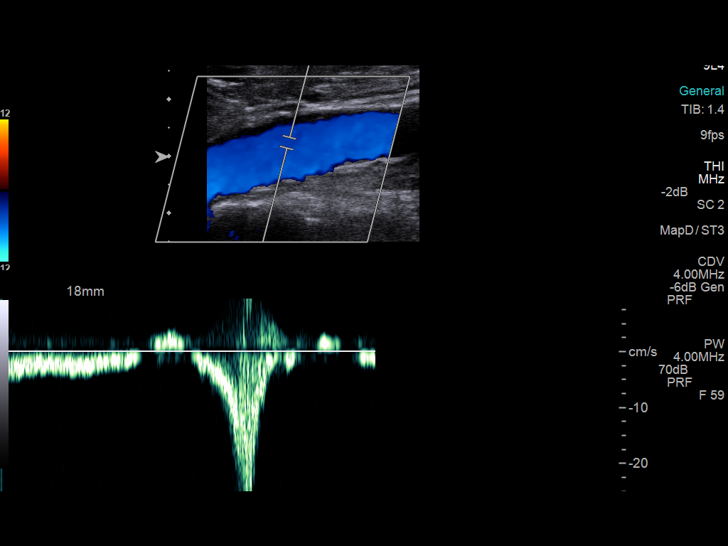
[im 33/59]
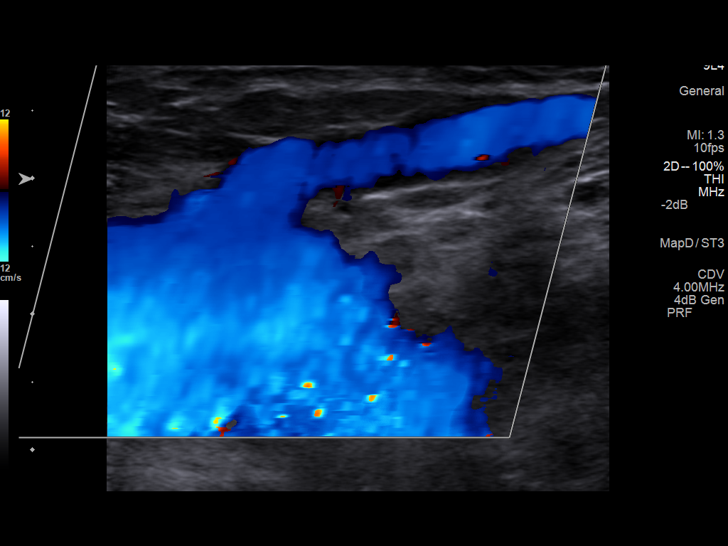
[im 38/59]
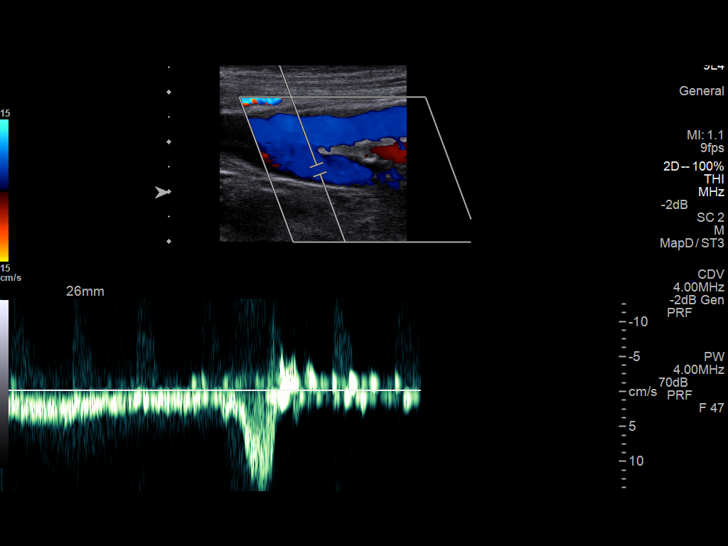
[im 43/59]
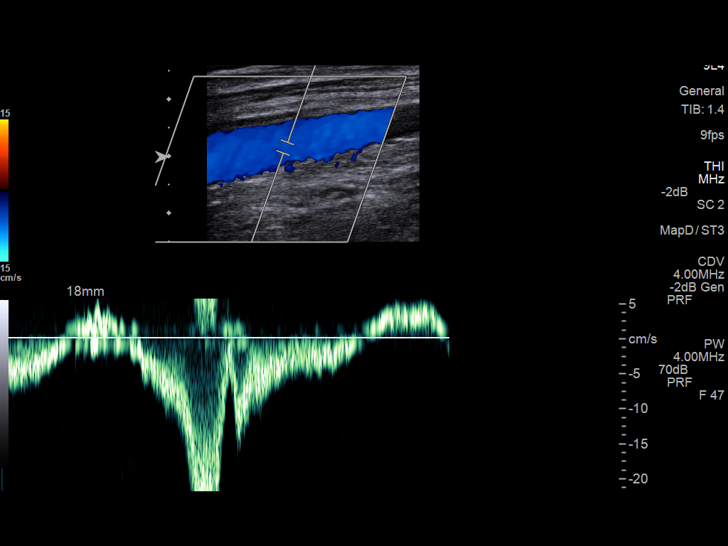
[im 48/59]
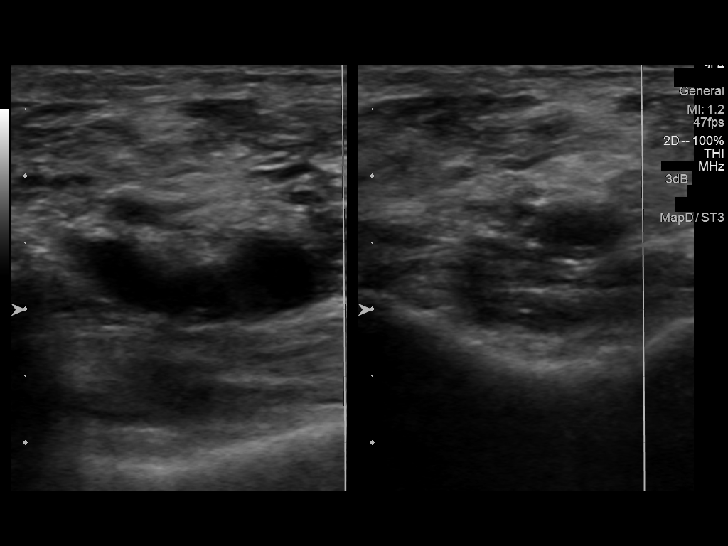
[im 53/59]
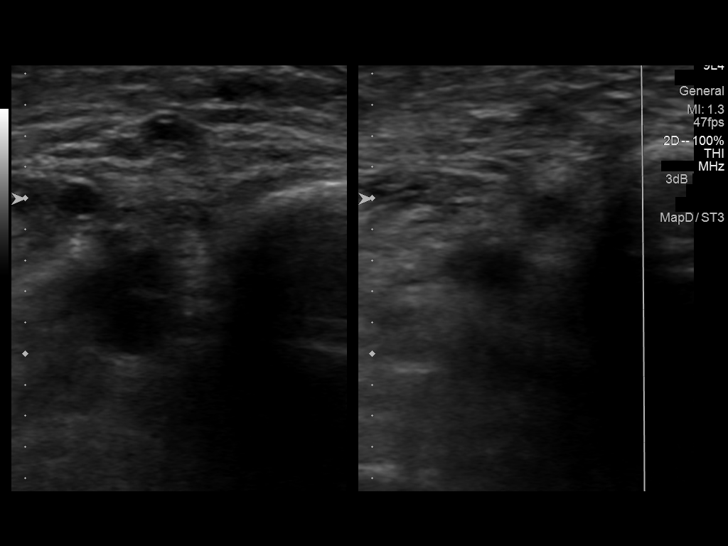
[im 59/59]
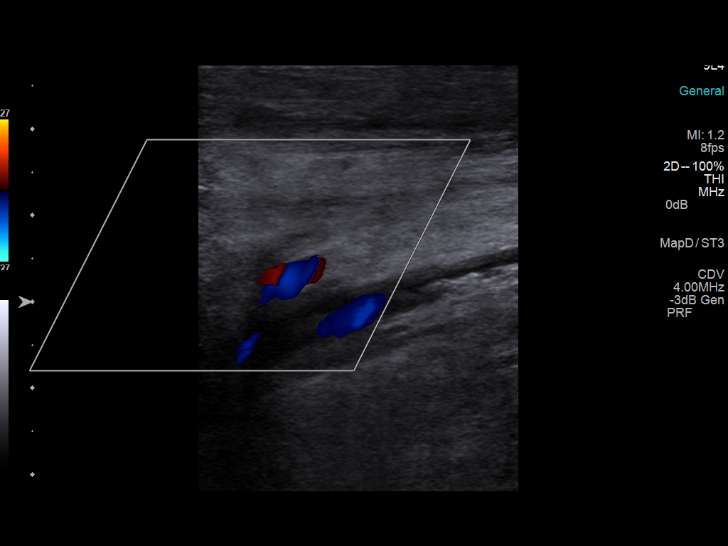

[13 of 24 positions shown; findings below may reference images not displayed]

FINDINGS: RIGHT LOWER EXTREMITY

Common Femoral Vein: No evidence of thrombus. Normal
compressibility, respiratory phasicity and response to augmentation.

Saphenofemoral Junction: No evidence of thrombus. Normal
compressibility and flow on color Doppler imaging.

Profunda Femoral Vein: No evidence of thrombus. Normal
compressibility and flow on color Doppler imaging.

Femoral Vein: No evidence of thrombus. Normal compressibility,
respiratory phasicity and response to augmentation.

Popliteal Vein: No evidence of thrombus. Normal compressibility,
respiratory phasicity and response to augmentation.

Calf Veins: No evidence of thrombus. Normal compressibility and flow
on color Doppler imaging.

Superficial Great Saphenous Vein: No evidence of thrombus. Normal
compressibility and flow on color Doppler imaging.

Venous Reflux:  None.

Other Findings:  None.

LEFT LOWER EXTREMITY

Common Femoral Vein: No evidence of thrombus. Normal
compressibility, respiratory phasicity and response to augmentation.

Saphenofemoral Junction: No evidence of thrombus. Normal
compressibility and flow on color Doppler imaging.

Profunda Femoral Vein: No evidence of thrombus. Normal
compressibility and flow on color Doppler imaging.

Femoral Vein: No evidence of thrombus. Normal compressibility,
respiratory phasicity and response to augmentation.

Popliteal Vein: No evidence of thrombus. Normal compressibility,
respiratory phasicity and response to augmentation.

Calf Veins: No evidence of thrombus. Normal compressibility and flow
on color Doppler imaging.

Superficial Great Saphenous Vein: No evidence of thrombus. Normal
compressibility and flow on color Doppler imaging.

Venous Reflux:  None.

Other Findings:  None.
IMPRESSION: No evidence of deep venous thrombosis.

## 2016-04-27 IMAGING — CT CT ANGIO CHEST
2 of 6 series · 6 of 36 positions shown · IV contrast (Omnipaque 300)
Comparison: Chest radiograph performed 01/01/2014, and CT of the
chest performed 12/01/2013

CLINICAL DATA: Acute onset of generalized chest pain and
tachycardia. Initial encounter.

EXAM:
CT ANGIOGRAPHY CHEST WITH CONTRAST
TECHNIQUE: Multidetector CT imaging of the chest was performed using the
standard protocol during bolus administration of intravenous
contrast. Multiplanar CT image reconstructions and MIPs were
obtained to evaluate the vascular anatomy.
CONTRAST:  100mL OMNIPAQUE IOHEXOL 350 MG/ML SOLN

[Series 5: pe 3.0 b40f · axial · 0.69mm/px · z∈[-316,-115]mm · 5 of 101 slices shown]
[im 17/101  lung]
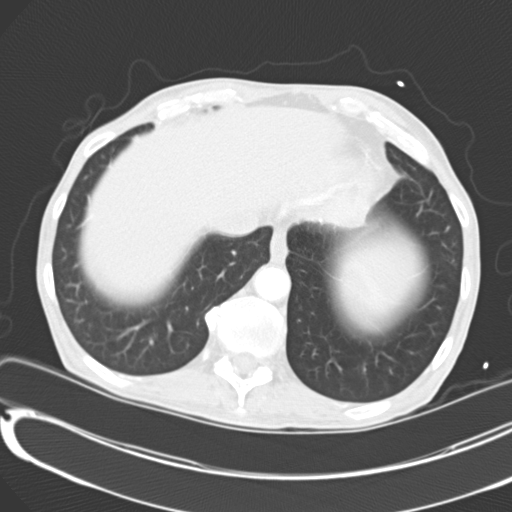
[im 34/101  mediastinal]
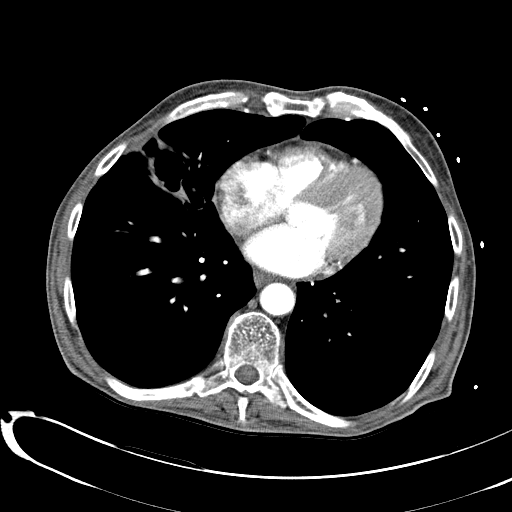
[im 51/101  lung]
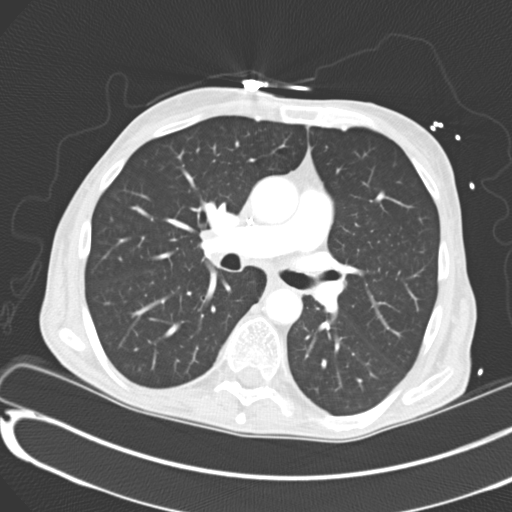
[im 67/101  mediastinal]
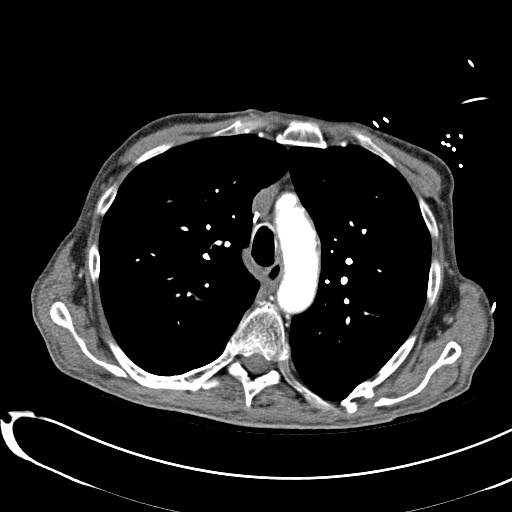
[im 84/101  lung]
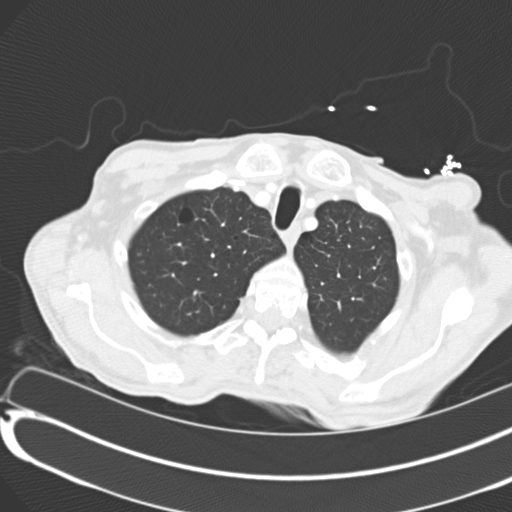

[Series 7: mpr coronal pe 3mm · coronal · 0.61mm/px · 1 of 86 slices shown]
[im 43/86  mediastinal]
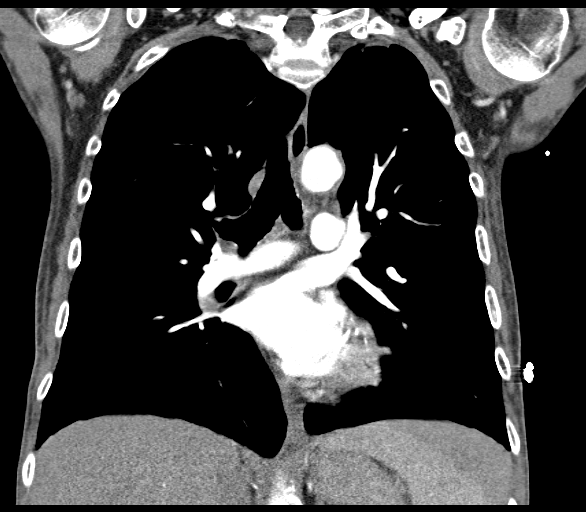

[6 of 36 positions shown; findings below may reference images not displayed]

FINDINGS: A small amount of nonocclusive pulmonary embolus is noted within the
left main pulmonary artery, extending distally into the left upper
and lower lobes.

There has been interval cavitation within the right middle lobe,
with chronic soft tissue thickening and minimal residual opacity, at
the site of the patient's prior pneumonia. Minimal airspace opacity
is also noted within the superior aspect of the left lower lobe.
There is no evidence of pleural effusion or pneumothorax. No masses
are identified; no abnormal focal contrast enhancement is seen.

Scattered coronary artery calcification is noted. The mediastinum is
otherwise unremarkable. No mediastinal lymphadenopathy is seen. No
pericardial effusion is identified. The great vessels are grossly
unremarkable in appearance. No axillary lymphadenopathy is seen. The
visualized portions of the thyroid gland are unremarkable in
appearance.

The visualized portions of the liver and spleen are unremarkable.

No acute osseous abnormalities are seen. Chronic left-sided rib
deformities and left clavicular deformity are again noted.

Review of the MIP images confirms the above findings.
IMPRESSION: 1. Small amount of nonocclusive pulmonary embolus within the left
main pulmonary artery, extending distally into the left upper and
lower lobes.
2. Interval cavitation at the right middle lobe, with chronic soft
tissue thickening and minimal residual airspace opacity at the site
of the patient's prior pneumonia. Minimal residual airspace opacity
also noted at the superior aspect of the left lower lobe, concerning
for residual infection.

Critical Value/emergent results were called by telephone at the time
of interpretation on 01/02/2014 at [DATE] to Dr. MBUCO CHEWE, who
verbally acknowledged these results.

## 2016-04-28 IMAGING — CT CT HEAD W/O CM
1 series · 15 of 30 positions shown, 19 images · non-contrast
Comparison: Head CT -12/06/2013; 12/04/2013 ; brain MRI -
12/04/2013

CLINICAL DATA: Evaluate brain abscess from [DATE]. Subsequent
encounter.

EXAM:
CT HEAD WITHOUT CONTRAST
TECHNIQUE: Contiguous axial images were obtained from the base of the skull
through the vertex without intravenous contrast.

[Series 2: headseq 4.8 h37s · axial · 0.43mm/px · z∈[+89,+246]mm · 15 of 36 slices shown, 19 images]
[im 2/36  brain]
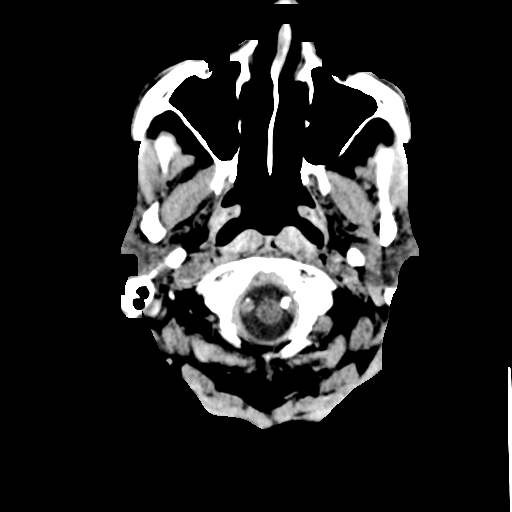
[im 2/36  bone]
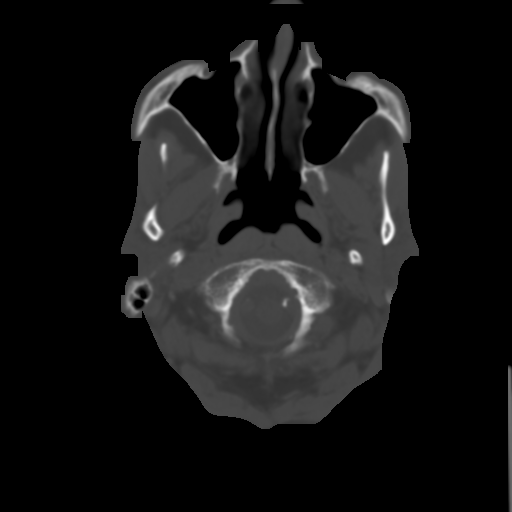
[im 4/36  brain]
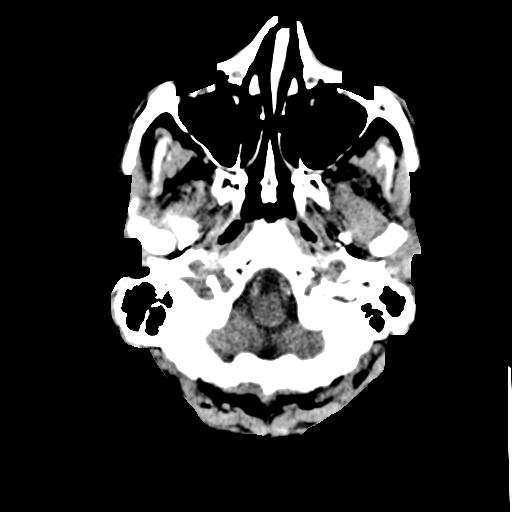
[im 7/36  brain]
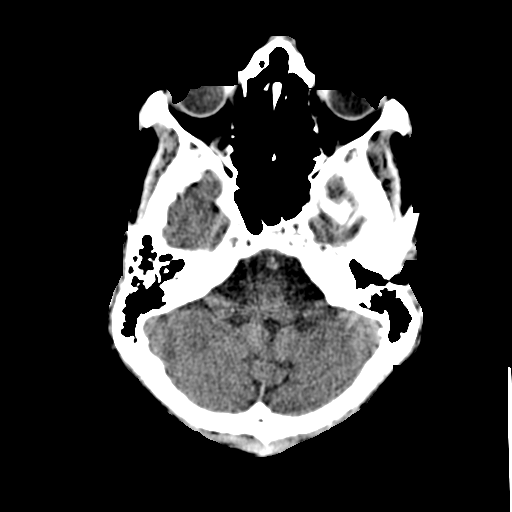
[im 9/36  brain]
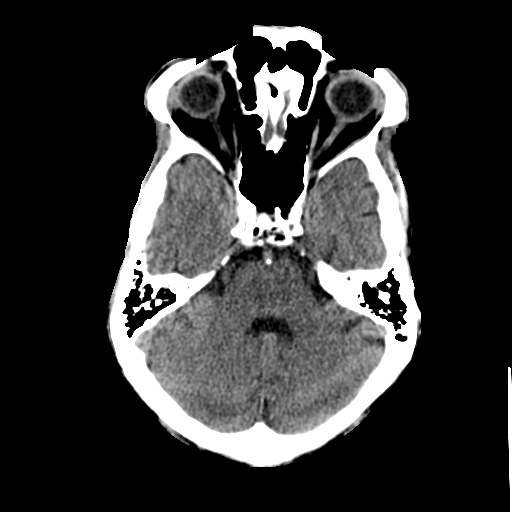
[im 11/36  brain]
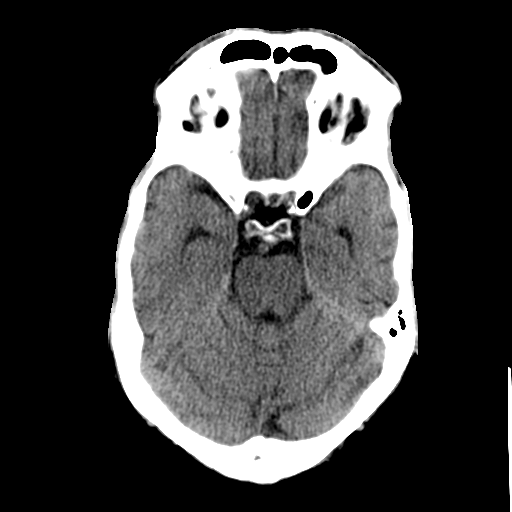
[im 11/36  bone]
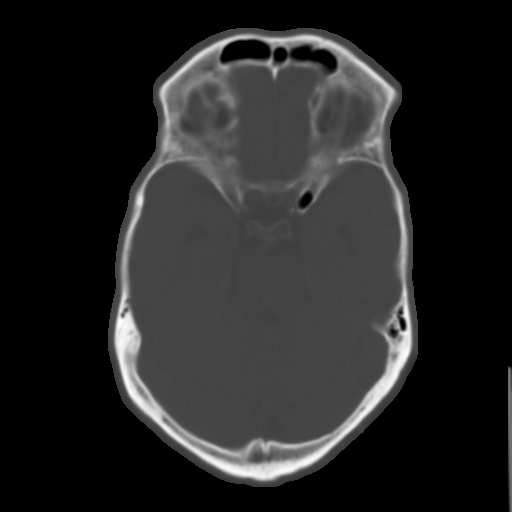
[im 14/36  brain]
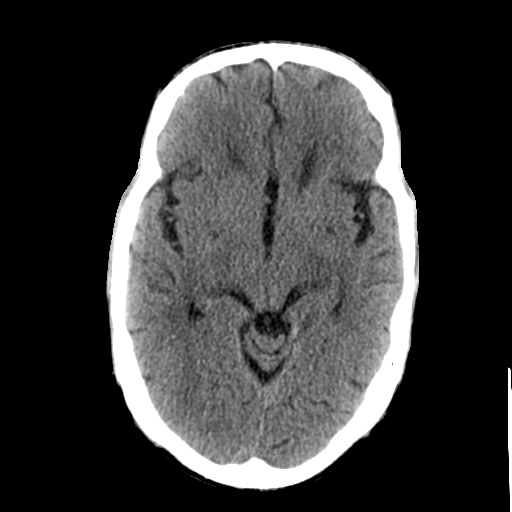
[im 16/36  brain]
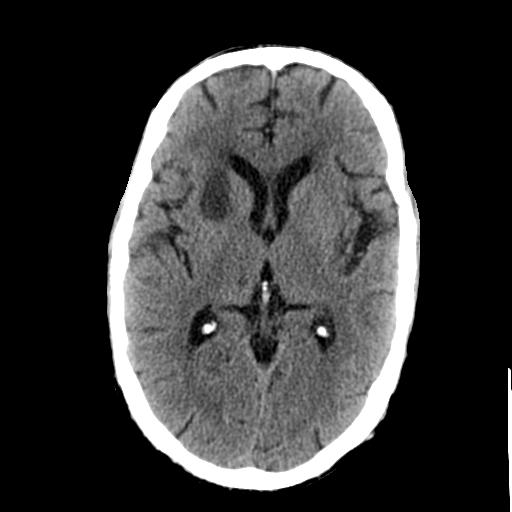
[im 19/36  brain]
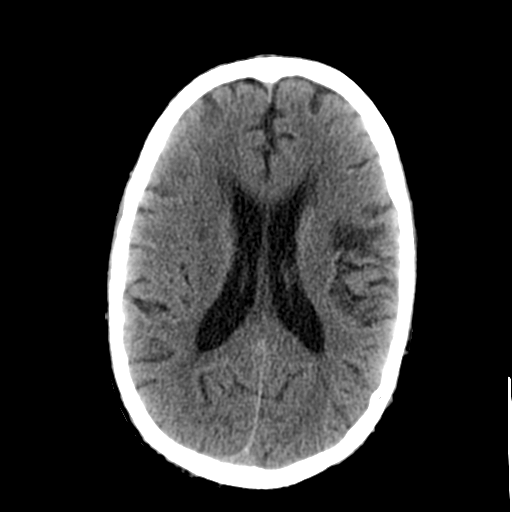
[im 20/36  brain]
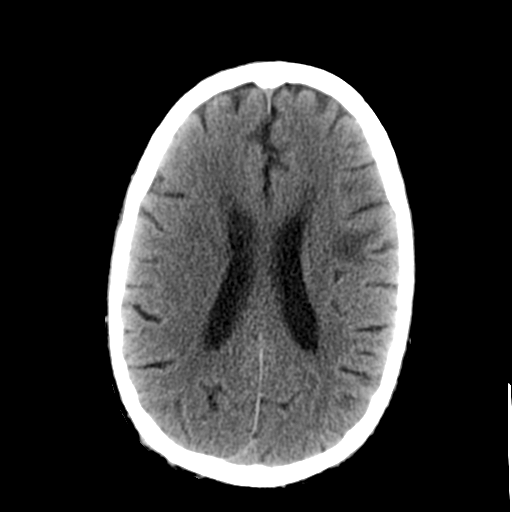
[im 20/36  bone]
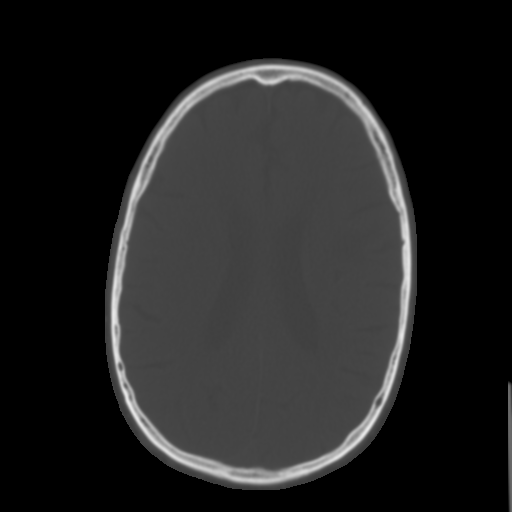
[im 22/36  brain]
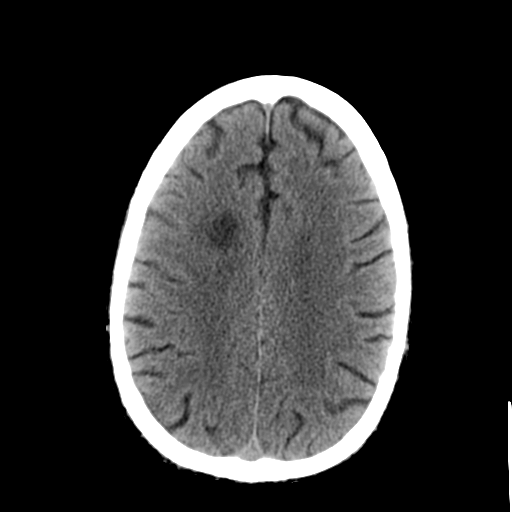
[im 25/36  brain]
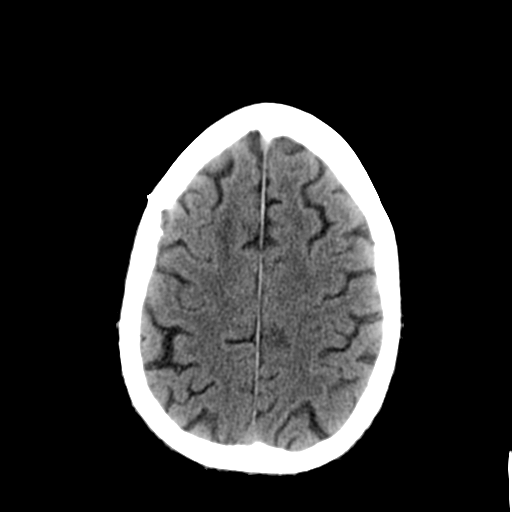
[im 27/36  brain]
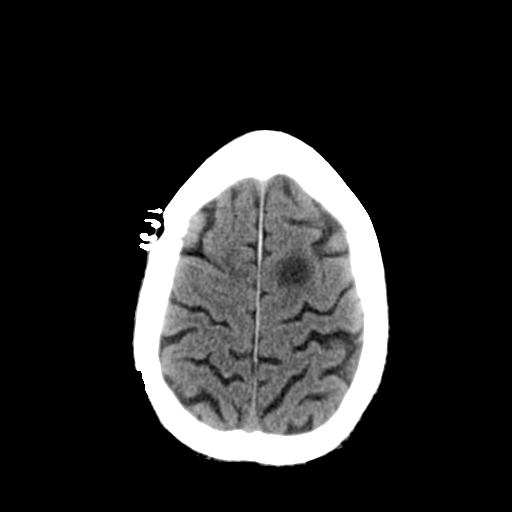
[im 29/36  brain]
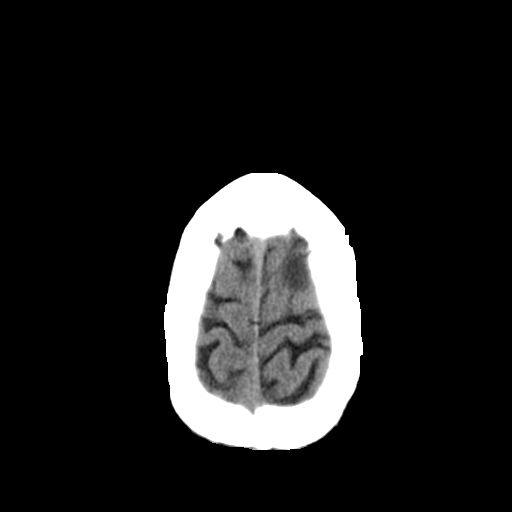
[im 29/36  bone]
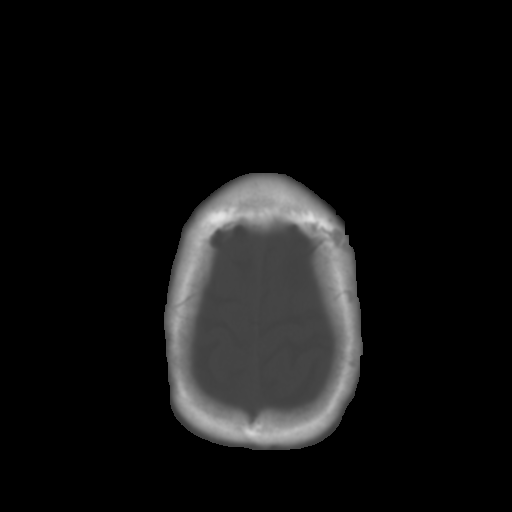
[im 32/36  brain]
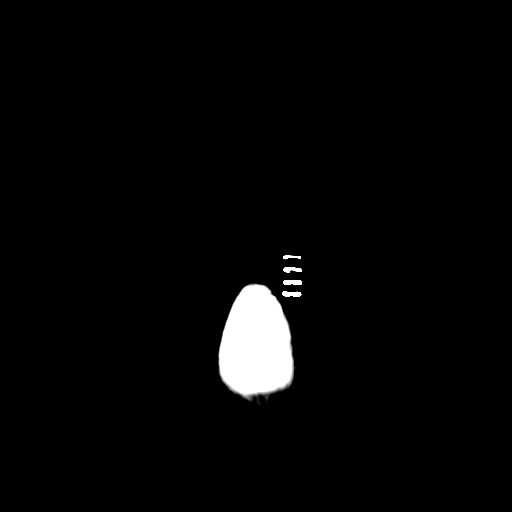
[im 34/36  brain]
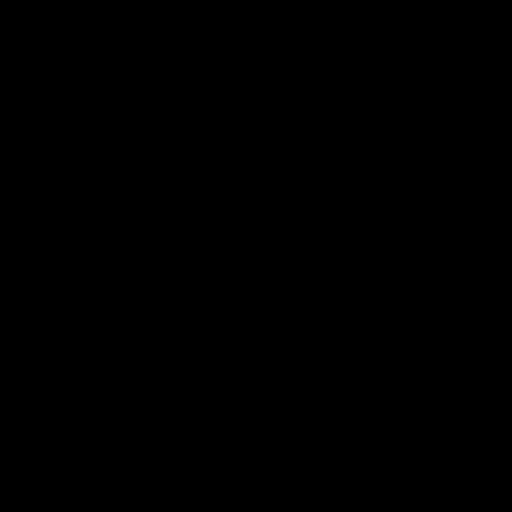

[15 of 30 positions shown; findings below may reference images not displayed]

FINDINGS: Interval evolution of previously identified intraparenchymal brain
abscesses.

Interval decrease in size of the intraparenchymal abscess within the
high left parietal lobe now measuring 1.8 x 2.0 cm (image 28, series
2), previously, 2.5 x 2.17 m with associated resolve pneumocephalus
and minimal amount of adjacent hemorrhage.

The ill-defined abscess within the right centrum semi ovale has
decreased in size in interval, now measuring approximately 1.8 x
cm (image 22), previously, 2.3 x 2.0 cm). The ill-defined abscess
within the right basal ganglia has decreased in size, now measuring
2.2 x 1.4 cm (image 16, series 2), previously, 2.7 x 2.7 cm.

The amount of vasogenic edema surrounding these intraparenchymal
abscess ease has significantly decreased in the interval with
associated now a very minimal amount of right to left midline shift,
measuring 2 mm in diameter, previously, 6 mm.

No new definitive intraparenchymal or extra-axial mass or
hemorrhage. Normal size and configuration of the ventricles and
basilar cisterns. Intracranial atherosclerosis.

Unchanged burr holes about the right frontoparietal calvarium and
high a left parietal calvarium with associated overlying surgical
staples. Regional soft tissues appear normal. Paranasal sinuses and
mastoid air cells are normally aerated. No air-fluid levels.
IMPRESSION: 1. Continued improvement in previously noted cerebral abscesses with
decreased vasogenic edema and mass effect.
2. No definitive superimposed acute intracranial process.

## 2016-04-28 NOTE — Addendum Note (Signed)
Addended by: Lianne Cure A on: 04/28/2016 08:52 AM   Modules accepted: Orders

## 2016-05-08 ENCOUNTER — Ambulatory Visit: Payer: Medicaid Other | Admitting: Podiatry

## 2016-05-10 IMAGING — CR DG CHEST 1V
1 series · 1 of 1 positions shown · non-contrast
Comparison: 01/01/2014

CLINICAL DATA: Wheezing and cough

EXAM:
CHEST - 1 VIEW

[view not recorded]
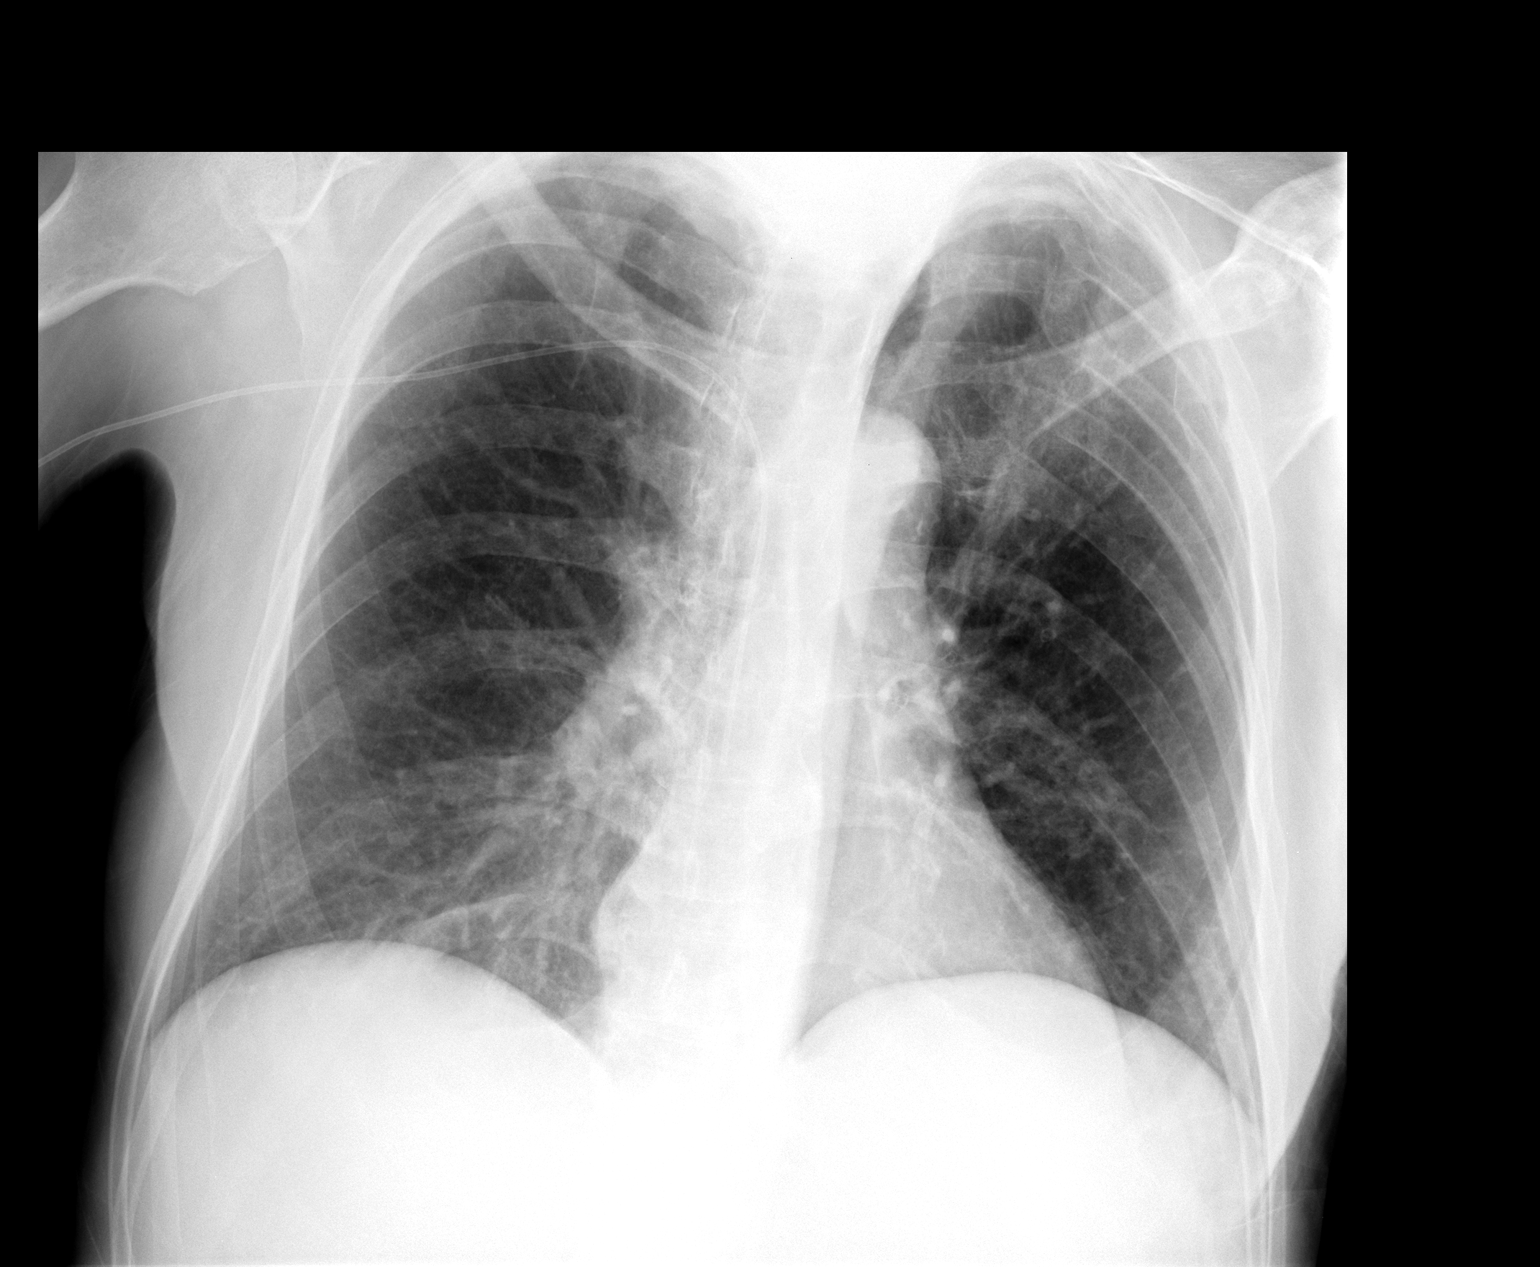

[1 of 1 positions shown; findings below may reference images not displayed]

FINDINGS: Cardiac shadow is within normal limits. A right-sided PICC line is
again seen and stable the lungs are again well aerated. The
previously seen cavitary lesion has nearly completely resolved in
the interval. Old rib fractures are again seen on the left. Old left
clavicular fracture is again noted. No new focal infiltrate is seen.
IMPRESSION: Near-complete resolution of previously seen cavitary lesion. No
acute abnormality noted.

## 2016-05-22 IMAGING — CR DG CHEST 2V
3 series · 3 of 3 positions shown · non-contrast
Comparison: Prior chest x-ray 01/15/2014

CLINICAL DATA: [age] year old male with central chest pain
beginning at 8 a.m. this morning and shortness of breath beginning
yesterday. Active pack per day smoker.

EXAM:
CHEST  2 VIEW

[chest lat (1 of 2)]
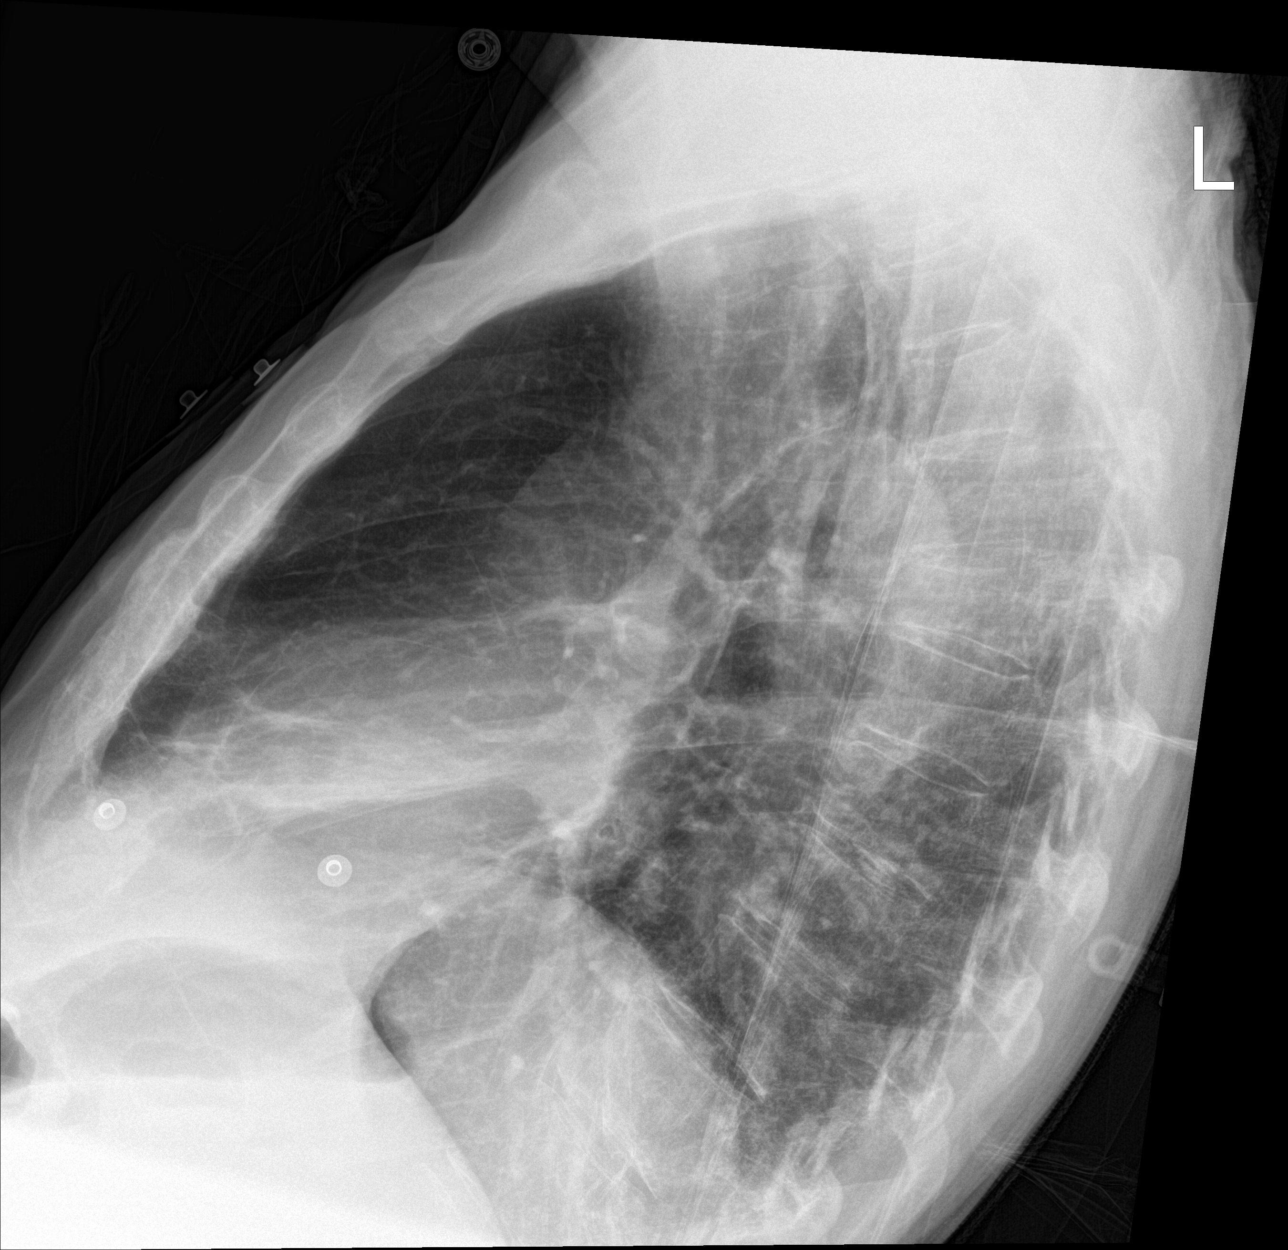

[chest ap]
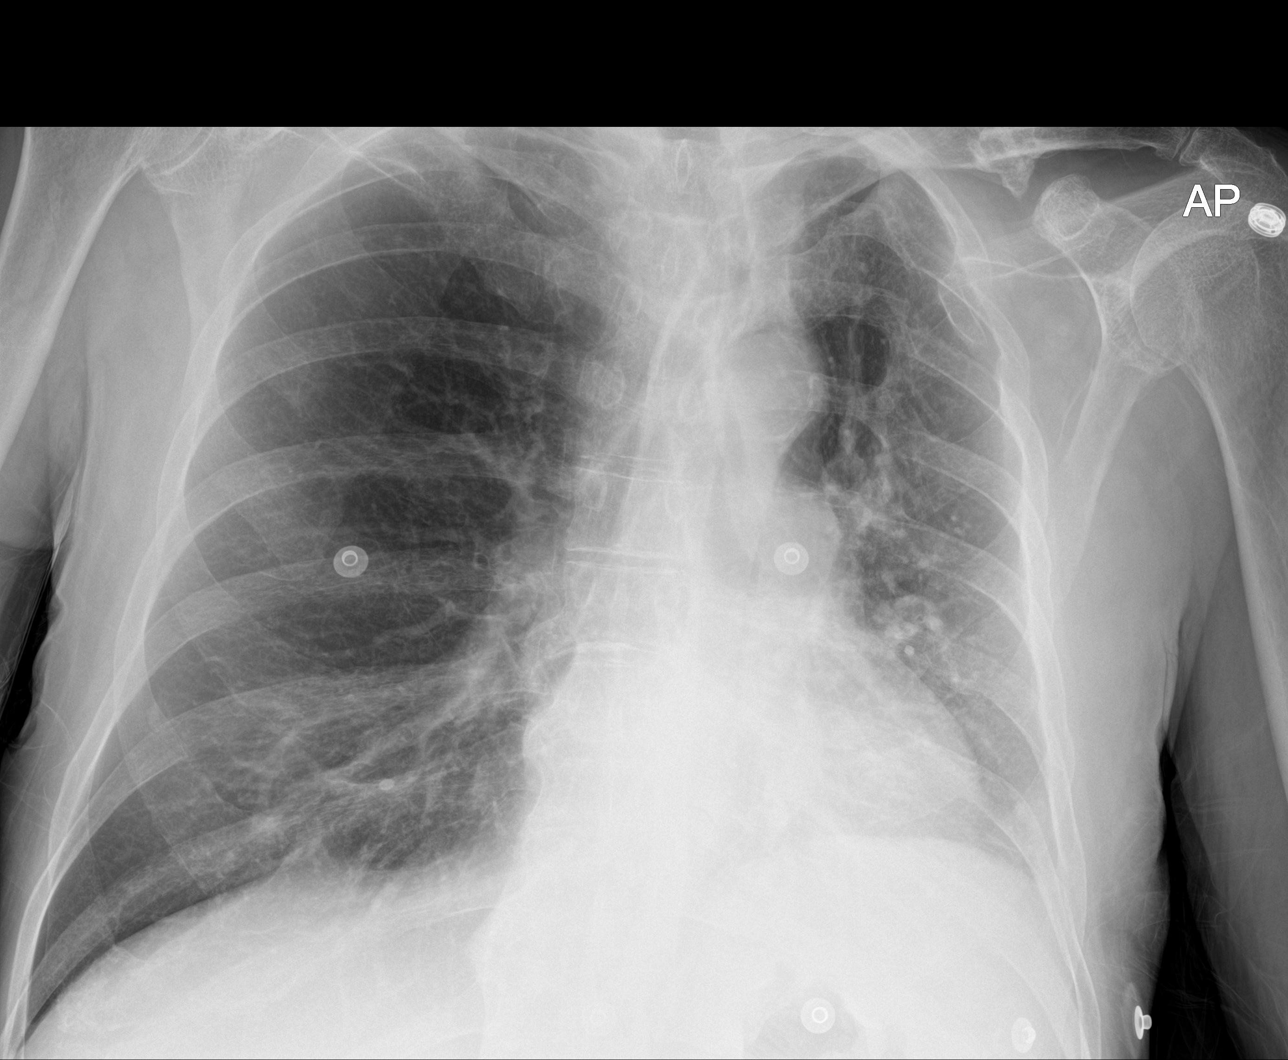

[chest lat (2 of 2)]
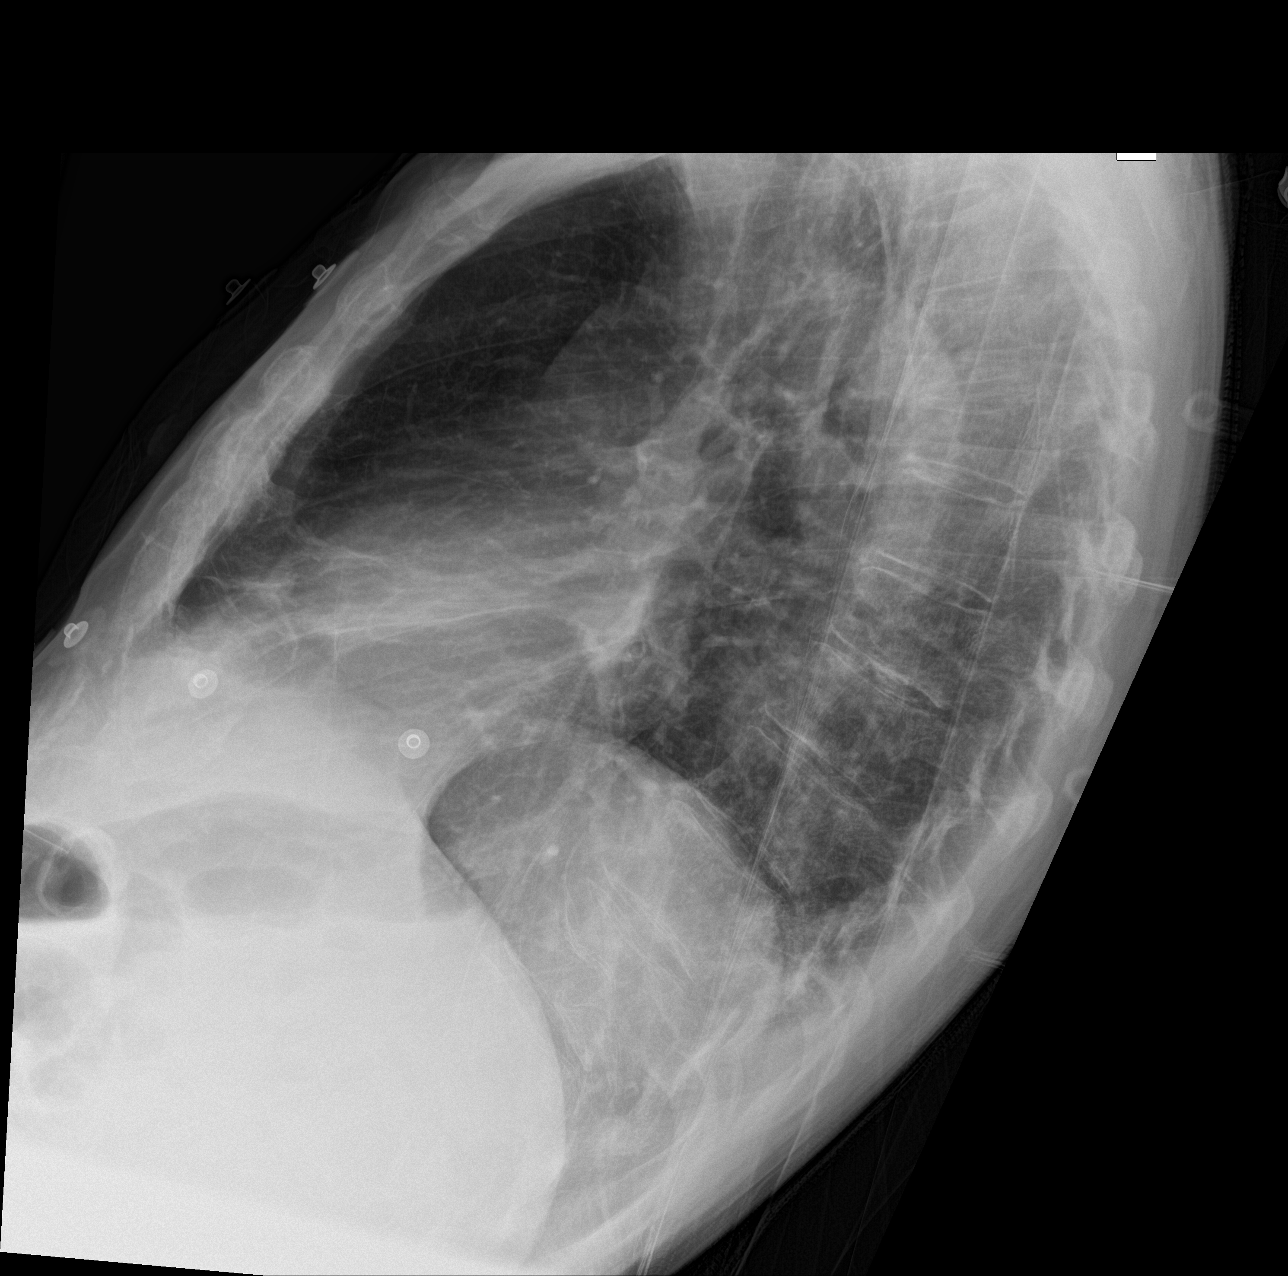

[3 of 3 positions shown; findings below may reference images not displayed]

FINDINGS: The patient is rotated toward the left. Interval removal of right
upper extremity PICC. Stable cardiac and mediastinal contours a
given limitations of rotated positioning. Atherosclerotic
calcifications are present within the aorta. New and nonspecific
lingular opacity which could reflect atelectasis or infiltrate. The
remainder the lungs are mildly hyperinflated with a central
bronchitic change and interstitial prominence consistent with
underlying COPD.
IMPRESSION: New patchy airspace opacity in the lingula concerning for
bronchopneumonia.

## 2016-05-23 IMAGING — DX DG CHEST 2V
3 series · 3 of 3 positions shown · non-contrast
Comparison: Two-view chest x-ray yesterday dating back to
01/26/2012. CTA chest 01/02/2014.

CLINICAL DATA: Two day history of shortness of breath. Follow-up
lingular pneumonia. Current history of diabetes and hypertension.
Current smoker. Left-sided pulmonary embolus on 01/02/2014.

EXAM:
CHEST  2 VIEW

[chest lat (1 of 2)]
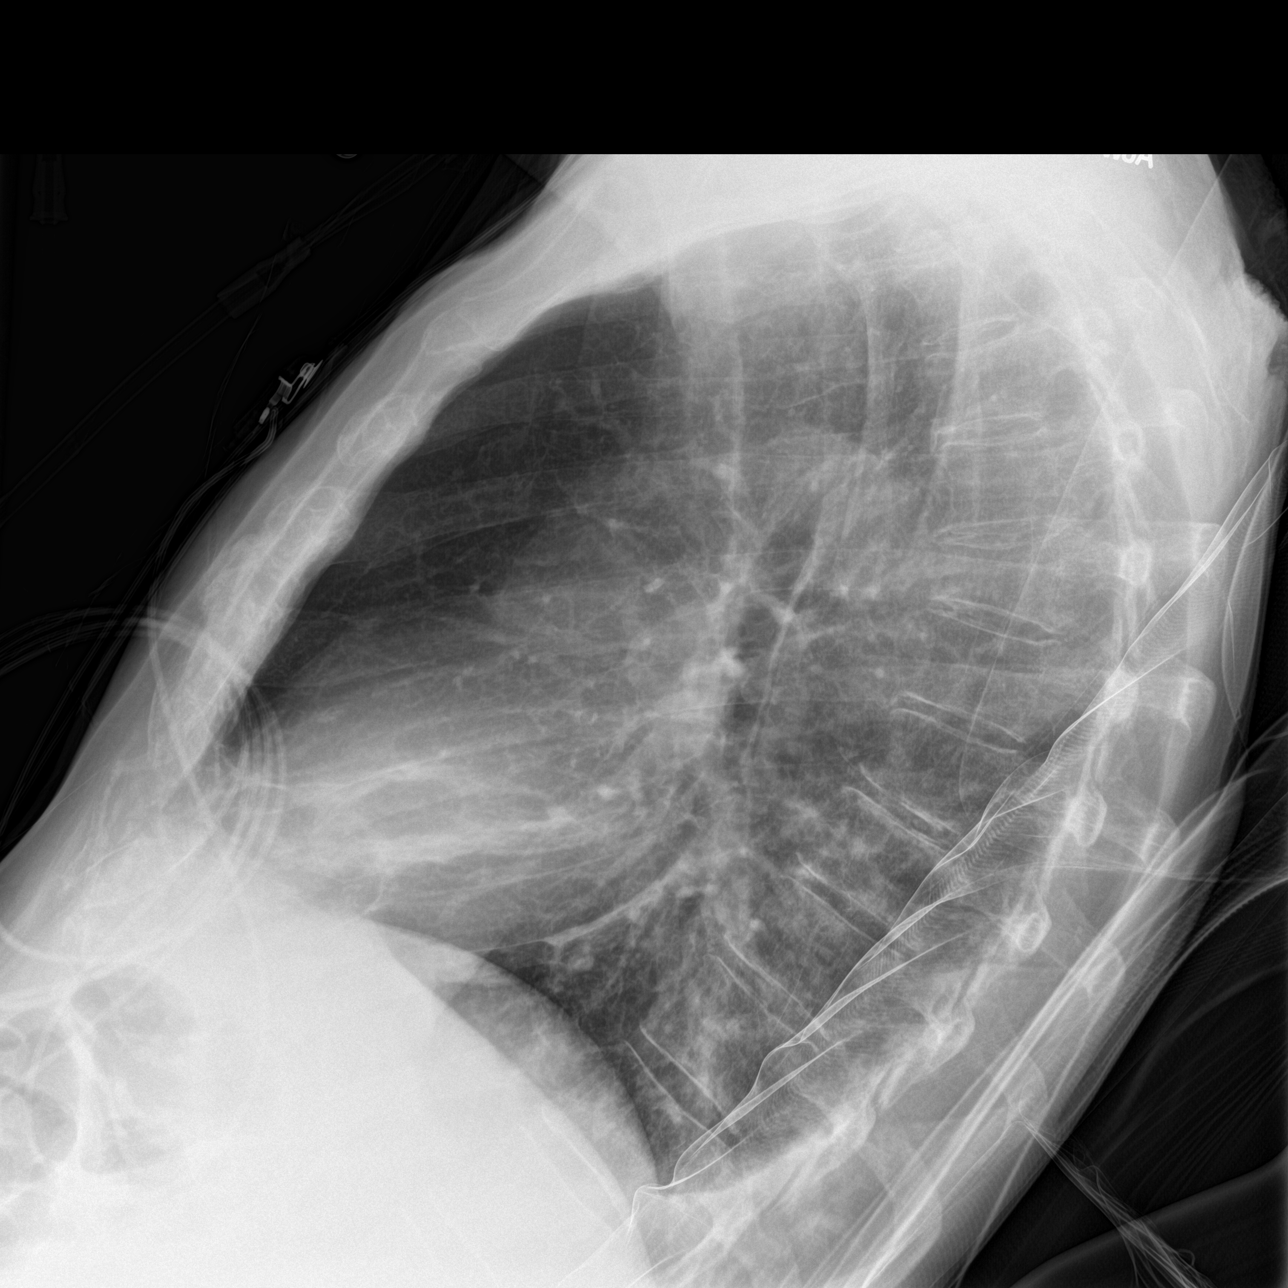

[chest ap]
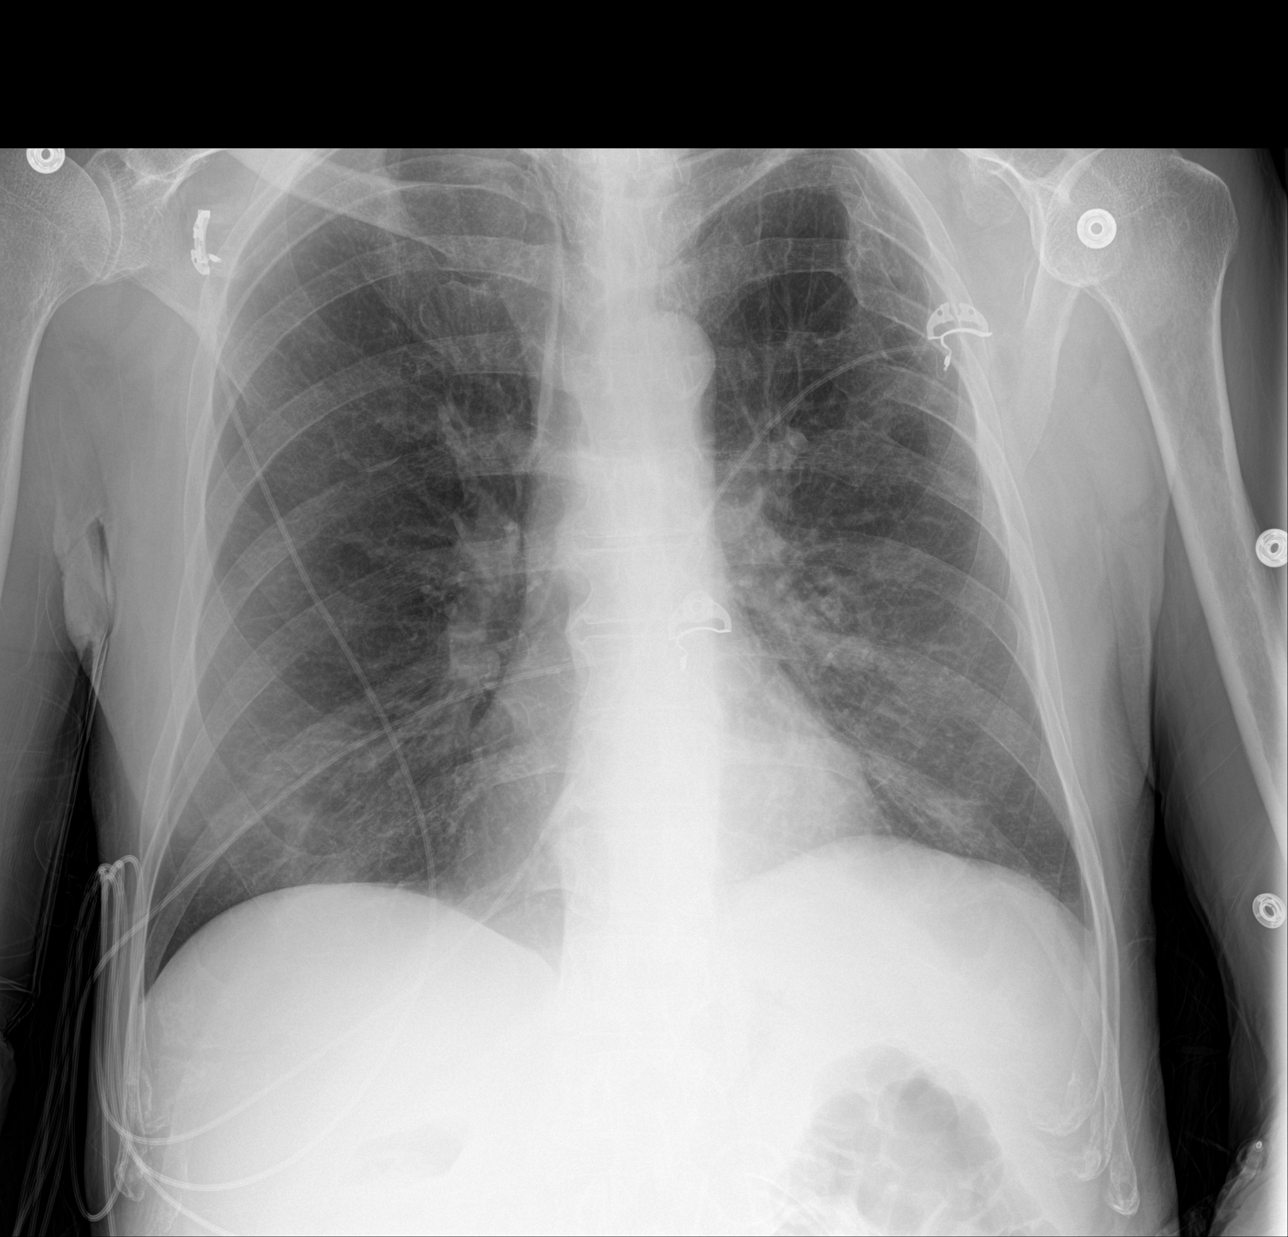

[chest lat (2 of 2)]
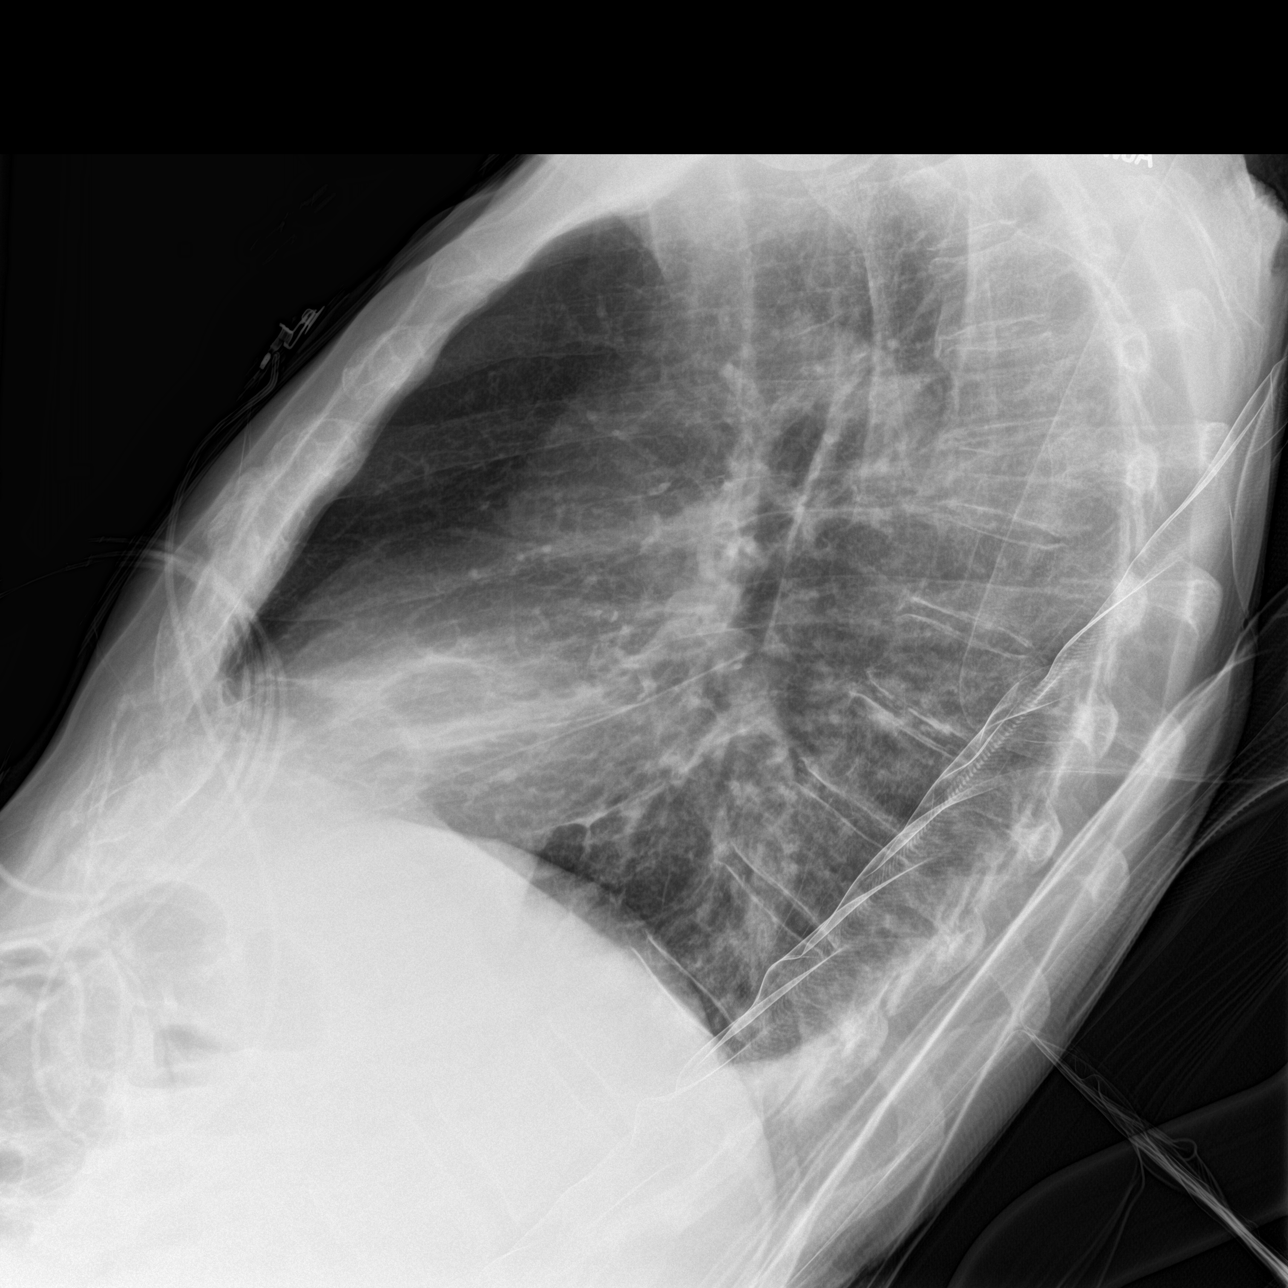

[3 of 3 positions shown; findings below may reference images not displayed]

FINDINGS: Cardiac silhouette normal in size, unchanged. Thoracic aorta
atherosclerotic, unchanged. Insert or otherwise streaky and patchy
airspace opacities in the lingula, with minimal improvement in
aeration since yesterday. No new pulmonary parenchymal
abnormalities. Emphysematous changes throughout both lungs,
particularly the upper lobes, unchanged. No pleural effusions.
Osseous demineralization with compression fracture of the upper
endplate of T5 on the order of 20% or so which is not acute. Old
healed fractures involving left upper ribs.
IMPRESSION: 1. Minimal improvement in the lingular pneumonia since yesterday.
2. No new abnormalities.  Baseline COPD/emphysema.

## 2016-05-25 ENCOUNTER — Other Ambulatory Visit: Payer: Self-pay | Admitting: Internal Medicine

## 2016-06-02 IMAGING — XA IR TUBE REMOVAL GASTROSTOMY
1 series · 1 of 1 positions shown · non-contrast
Comparison: none

CLINICAL DATA: 53-year-old male referred for removal of
percutaneous gastrostomy tube. The patient is swallowing normally.

[Series 1: run · 1 of 1 slices shown]
[im 1/1]
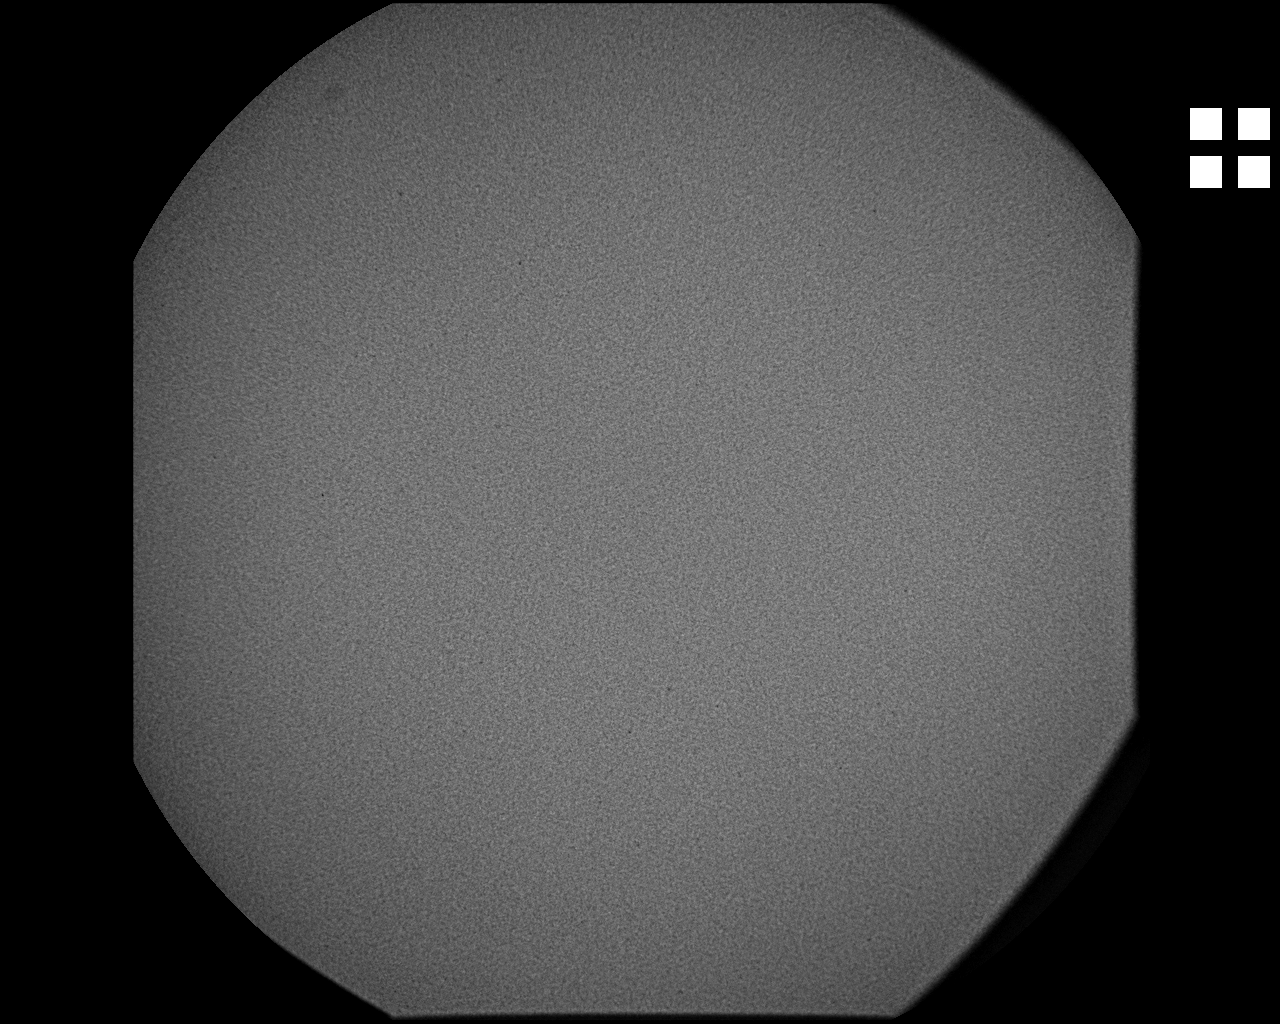

[1 of 1 positions shown; findings below may reference images not displayed]

EXAM:
BEDSIDE REMOVAL OF PERCUTANEOUS GASTROSTOMY

FLUOROSCOPY TIME:  None

MEDICATIONS AND MEDICAL HISTORY:
None

ANESTHESIA/SEDATION:
None

CONTRAST:  None

PROCEDURE:
The procedure, risks, benefits, and alternatives were explained to
the patient. Questions regarding the procedure were encouraged and
answered. The patient understands and consents to the procedure.

After discussion of the bedside procedure and expectations regarding
closure of the ostomy, manual traction was used to remove the tube.
The tube was removed in its entirety.

Face dressing was placed.

The patient tolerated the procedure well and remained
hemodynamically stable throughout.

No complications were encountered.  No blood loss was encounter.
FINDINGS: The tube was removed in its entirety.
IMPRESSION: Status post bedside removal of 20 French percutaneous gastrostomy
tube.

## 2016-06-03 ENCOUNTER — Ambulatory Visit: Payer: Medicaid Other | Admitting: Podiatry

## 2016-06-09 IMAGING — CR DG CHEST 2V
2 series · 2 of 2 positions shown · non-contrast
Comparison: 01/28/2014, 01/02/2014

CLINICAL DATA: COPD, hypertension, diabetes, tachycardia and acute
chest pain

EXAM:
CHEST  2 VIEW

[w chest pa]
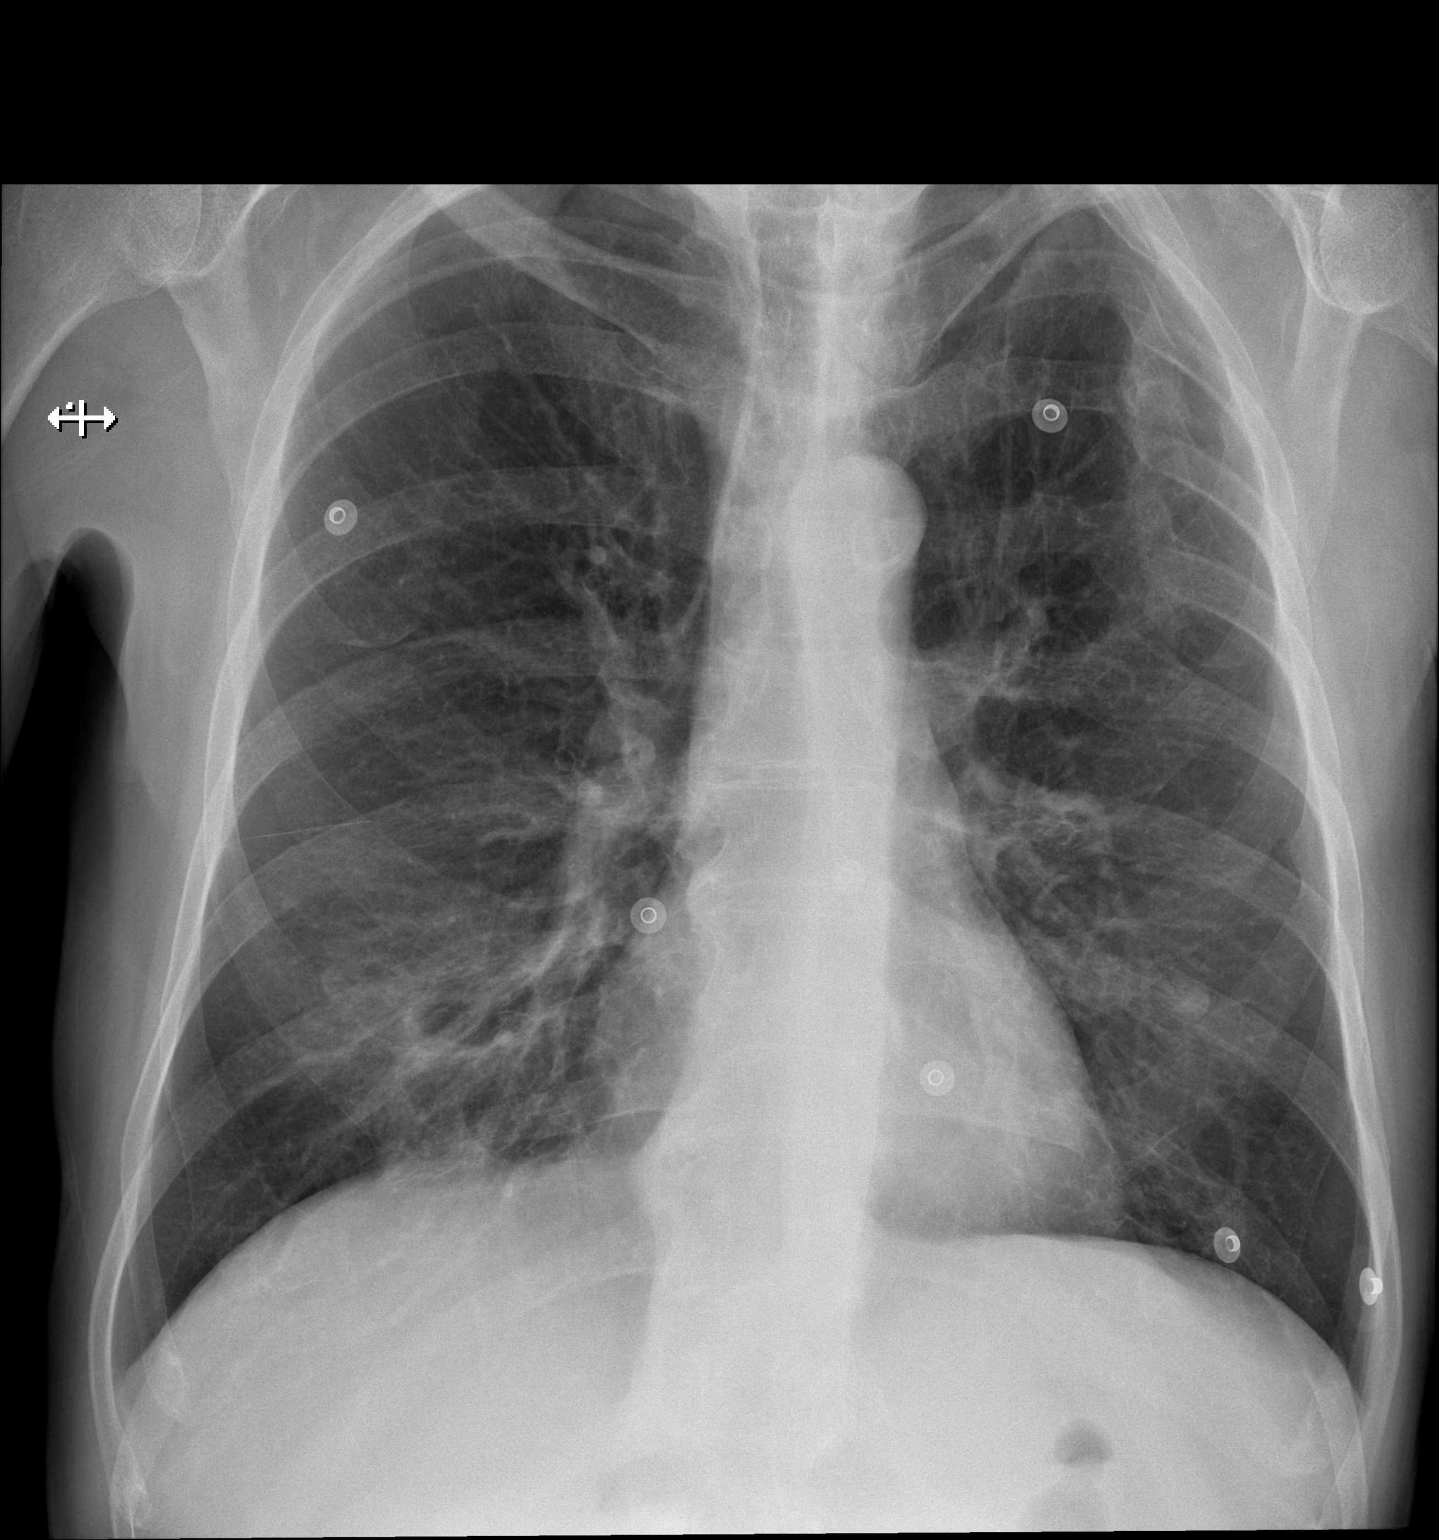

[w chest lat]
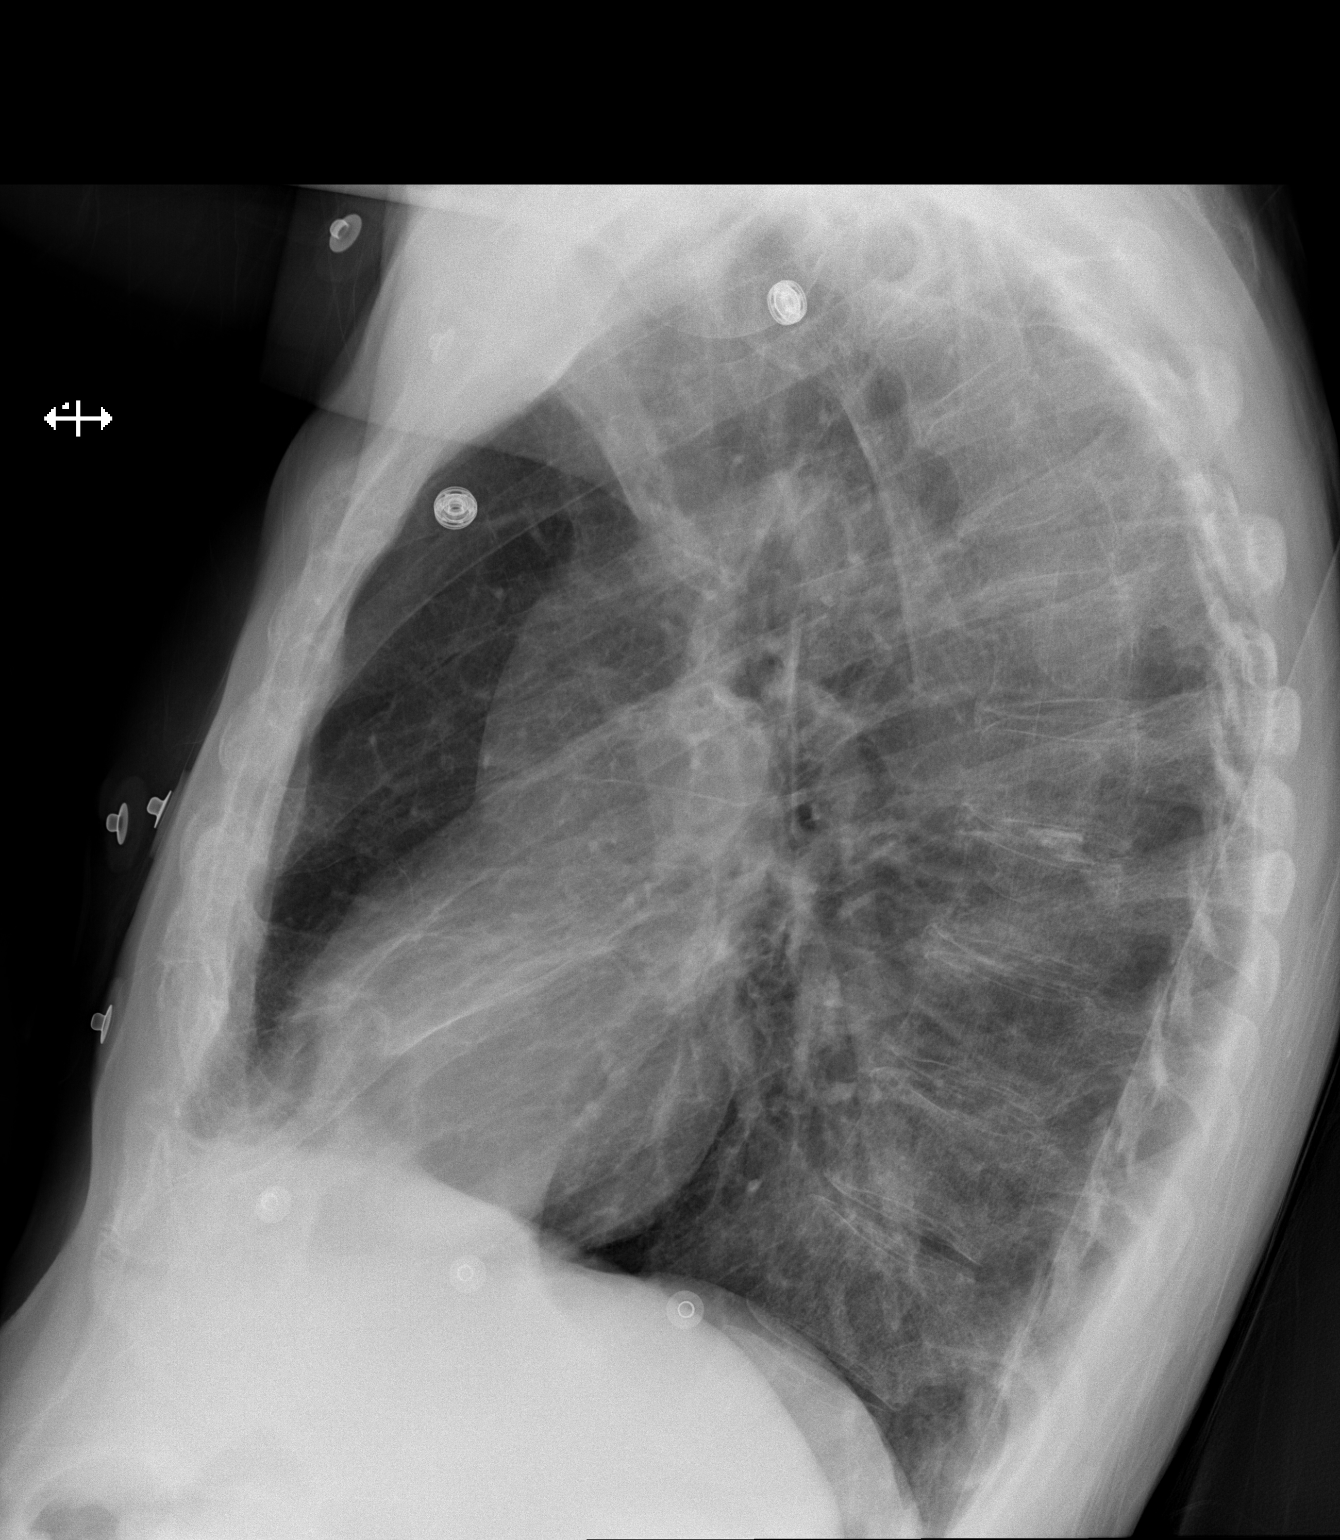

[2 of 2 positions shown; findings below may reference images not displayed]

FINDINGS: Hyperinflation noted compatible with background COPD/ emphysema.
Healed left posterior rib fractures with deformity. Left lung
remains clear. Minimal streaky bronchovascular opacities in the
right middle lobe noted with oval central cavitation. This is a
chronic finding related to a prior right middle lobe pneumonia. No
definite new focal airspace process, collapse or consolidation. No
effusion or pneumothorax. Trachea midline. Degenerative changes
spine. Normal heart size and vascularity. Atherosclerosis evident of
the aorta.
IMPRESSION: Hyperinflation compatible with background COPD/ emphysema.

Chronic right middle lobe scarring with central cavitation, suspect
post infectious/inflammatory. Patient recently had consolidative
right middle lobe cavitary pneumonia in November 2013.

## 2016-06-16 ENCOUNTER — Encounter (HOSPITAL_COMMUNITY): Payer: Self-pay | Admitting: Emergency Medicine

## 2016-06-16 ENCOUNTER — Emergency Department (HOSPITAL_COMMUNITY)
Admission: EM | Admit: 2016-06-16 | Discharge: 2016-06-16 | Disposition: A | Discharge status: Home / Self Care | Payer: Medicaid Other | Attending: Emergency Medicine | Admitting: Emergency Medicine

## 2016-06-16 ENCOUNTER — Emergency Department (HOSPITAL_COMMUNITY): Payer: Medicaid Other

## 2016-06-16 DIAGNOSIS — E119 Type 2 diabetes mellitus without complications: Secondary | ICD-10-CM | POA: Diagnosis not present

## 2016-06-16 DIAGNOSIS — I509 Heart failure, unspecified: Secondary | ICD-10-CM | POA: Diagnosis not present

## 2016-06-16 DIAGNOSIS — R0602 Shortness of breath: Secondary | ICD-10-CM | POA: Diagnosis present

## 2016-06-16 DIAGNOSIS — J441 Chronic obstructive pulmonary disease with (acute) exacerbation: Secondary | ICD-10-CM | POA: Diagnosis not present

## 2016-06-16 DIAGNOSIS — F17210 Nicotine dependence, cigarettes, uncomplicated: Secondary | ICD-10-CM | POA: Insufficient documentation

## 2016-06-16 DIAGNOSIS — I110 Hypertensive heart disease with heart failure: Secondary | ICD-10-CM | POA: Diagnosis not present

## 2016-06-16 LAB — I-STAT CHEM 8, ED
BUN: 24 mg/dL — AB (ref 6–20)
CALCIUM ION: 1.08 mmol/L — AB (ref 1.15–1.40)
CREATININE: 0.9 mg/dL (ref 0.61–1.24)
Chloride: 99 mmol/L — ABNORMAL LOW (ref 101–111)
GLUCOSE: 128 mg/dL — AB (ref 65–99)
HCT: 44 % (ref 39.0–52.0)
HEMOGLOBIN: 15 g/dL (ref 13.0–17.0)
Potassium: 3.8 mmol/L (ref 3.5–5.1)
Sodium: 137 mmol/L (ref 135–145)
TCO2: 28 mmol/L (ref 0–100)

## 2016-06-16 LAB — I-STAT TROPONIN, ED: Troponin i, poc: 0 ng/mL (ref 0.00–0.08)

## 2016-06-16 MED ORDER — ALBUTEROL SULFATE HFA 108 (90 BASE) MCG/ACT IN AERS: 2.0000 | INHALATION_SPRAY | RESPIRATORY_TRACT | 1 refills | Status: DC | PRN

## 2016-06-16 MED ORDER — ACETAMINOPHEN 325 MG PO TABS
650.0000 mg | ORAL_TABLET | Freq: Once | ORAL | Status: AC
  Administered 2016-06-16: 650 mg via ORAL
  Filled 2016-06-16: qty 2

## 2016-06-16 MED ORDER — PREDNISONE 20 MG PO TABS: 40.0000 mg | ORAL_TABLET | Freq: Every day | ORAL | 0 refills | Status: AC

## 2016-06-16 MED ORDER — IPRATROPIUM-ALBUTEROL 0.5-2.5 (3) MG/3ML IN SOLN
3.0000 mL | Freq: Once | RESPIRATORY_TRACT | Status: AC
  Administered 2016-06-16: 3 mL via RESPIRATORY_TRACT
  Filled 2016-06-16: qty 3

## 2016-06-16 MED ORDER — AZITHROMYCIN 250 MG PO TABS
500.0000 mg | ORAL_TABLET | Freq: Once | ORAL | Status: AC
  Administered 2016-06-16: 500 mg via ORAL
  Filled 2016-06-16: qty 2

## 2016-06-16 MED ORDER — ALBUTEROL SULFATE (2.5 MG/3ML) 0.083% IN NEBU
5.0000 mg | INHALATION_SOLUTION | Freq: Once | RESPIRATORY_TRACT | Status: AC
  Administered 2016-06-16: 5 mg via RESPIRATORY_TRACT
  Filled 2016-06-16: qty 6

## 2016-06-16 MED ORDER — METHYLPREDNISOLONE SODIUM SUCC 125 MG IJ SOLR
125.0000 mg | Freq: Once | INTRAMUSCULAR | Status: AC
  Administered 2016-06-16: 125 mg via INTRAVENOUS
  Filled 2016-06-16: qty 2

## 2016-06-16 MED ORDER — AZITHROMYCIN 250 MG PO TABS: 250.0000 mg | ORAL_TABLET | Freq: Every day | ORAL | 0 refills | Status: DC

## 2016-06-16 NOTE — ED Triage Notes (Signed)
Pt c/o shortness of breath x 2 weeks. Worsened this morning. EMS states wheezing and initial sats 91% RA on their arrival. Pt given Neb treatment by  EMS, continues 90-91% RA. Pt also c/o intermittent LUQ abdominal pain.

## 2016-06-16 NOTE — ED Notes (Signed)
Bed: WA16 Expected date:  Expected time:  Means of arrival:  Comments: EMS-[facility] 

## 2016-06-16 NOTE — Discharge Instructions (Signed)
We believe that your symptoms are caused today by an exacerbation of your COPD, and possibly bronchitis.  Please take the prescribed medications and any medications that you have at home for your COPD.  Follow up with your doctor as recommended.  If you develop any new or worsening symptoms, including but not limited to fever, persistent vomiting, worsening shortness of breath, or other symptoms that concern you, please return to the Emergency Department immediately. ° ° °Chronic Obstructive Pulmonary Disease °Chronic obstructive pulmonary disease (COPD) is a common lung condition in which airflow from the lungs is limited. COPD is a general term that can be used to describe many different lung problems that limit airflow, including both chronic bronchitis and emphysema.  If you have COPD, your lung function will probably never return to normal, but there are measures you can take to improve lung function and make yourself feel better.  °CAUSES  °Smoking (common).   °Exposure to secondhand smoke.   °Genetic problems. °Chronic inflammatory lung diseases or recurrent infections. °SYMPTOMS  °Shortness of breath, especially with physical activity.   °Deep, persistent (chronic) cough with a large amount of thick mucus.   °Wheezing.   °Rapid breaths (tachypnea).   °Gray or bluish discoloration (cyanosis) of the skin, especially in fingers, toes, or lips.   °Fatigue.   °Weight loss.   °Frequent infections or episodes when breathing symptoms become much worse (exacerbations).   °Chest tightness. °DIAGNOSIS  °Your health care provider will take a medical history and perform a physical examination to make the initial diagnosis.  Additional tests for COPD may include:  °Lung (pulmonary) function tests. °Chest X-ray. °CT scan. °Blood tests. °TREATMENT  °Treatment available to help you feel better when you have COPD includes:  °Inhaler and nebulizer medicines. These help manage the symptoms of COPD and make your breathing more  comfortable. °Supplemental oxygen. Supplemental oxygen is only helpful if you have a low oxygen level in your blood.   °Exercise and physical activity. These are beneficial for nearly all people with COPD. Some people may also benefit from a pulmonary rehabilitation program. °HOME CARE INSTRUCTIONS  °Take all medicines (inhaled or pills) as directed by your health care provider. °Avoid over-the-counter medicines or cough syrups that dry up your airway (such as antihistamines) and slow down the elimination of secretions unless instructed otherwise by your health care provider.   °If you are a smoker, the most important thing that you can do is stop smoking. Continuing to smoke will cause further lung damage and breathing trouble. Ask your health care provider for help with quitting smoking. He or she can direct you to community resources or hospitals that provide support. °Avoid exposure to irritants such as smoke, chemicals, and fumes that aggravate your breathing. °Use oxygen therapy and pulmonary rehabilitation if directed by your health care provider. If you require home oxygen therapy, ask your health care provider whether you should purchase a pulse oximeter to measure your oxygen level at home.   °Avoid contact with individuals who have a contagious illness. °Avoid extreme temperature and humidity changes. °Eat healthy foods. Eating smaller, more frequent meals and resting before meals may help you maintain your strength. °Stay active, but balance activity with periods of rest. Exercise and physical activity will help you maintain your ability to do things you want to do. °Preventing infection and hospitalization is very important when you have COPD. Make sure to receive all the vaccines your health care provider recommends, especially the pneumococcal and influenza   vaccines. Ask your health care provider whether you need a pneumonia vaccine. °Learn and use relaxation techniques to manage stress. °Learn and  use controlled breathing techniques as directed by your health care provider. Controlled breathing techniques include:   °Pursed lip breathing. Start by breathing in (inhaling) through your nose for 1 second. Then, purse your lips as if you were going to whistle and breathe out (exhale) through the pursed lips for 2 seconds.   °Diaphragmatic breathing. Start by putting one hand on your abdomen just above your waist. Inhale slowly through your nose. The hand on your abdomen should move out. Then purse your lips and exhale slowly. You should be able to feel the hand on your abdomen moving in as you exhale.   °Learn and use controlled coughing to clear mucus from your lungs. Controlled coughing is a series of short, progressive coughs. The steps of controlled coughing are:   °Lean your head slightly forward.   °Breathe in deeply using diaphragmatic breathing.   °Try to hold your breath for 3 seconds.   °Keep your mouth slightly open while coughing twice.   °Spit any mucus out into a tissue.   °Rest and repeat the steps once or twice as needed. °SEEK MEDICAL CARE IF:  °You are coughing up more mucus than usual.   °There is a change in the color or thickness of your mucus.   °Your breathing is more labored than usual.   °Your breathing is faster than usual.   °SEEK IMMEDIATE MEDICAL CARE IF:  °You have shortness of breath while you are resting.   °You have shortness of breath that prevents you from: °Being able to talk.   °Performing your usual physical activities.   °You have chest pain lasting longer than 5 minutes.   °Your skin color is more cyanotic than usual. °You measure low oxygen saturations for longer than 5 minutes with a pulse oximeter. °MAKE SURE YOU:  °Understand these instructions. °Will watch your condition. °Will get help right away if you are not doing well or get worse. °Document Released: 10/08/2004 Document Revised: 05/15/2013 Document Reviewed: 08/25/2012 °[employer]® Patient Information ©2015  [employer]. This information is not intended to replace advice given to you by your health care provider. Make sure you discuss any questions you have with your health care provider. ° °

## 2016-06-16 NOTE — ED Notes (Signed)
Patient left prior to formal discharge-was in front of the hospital smoking-returned for d/c paper work-paper work given although patient did not want to stay for formal d/c teaching/process
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
## 2016-06-17 ENCOUNTER — Emergency Department (HOSPITAL_COMMUNITY)
Admission: EM | Admit: 2016-06-17 | Discharge: 2016-06-17 | Disposition: A | Discharge status: Home / Self Care | Payer: Medicaid Other | Attending: Emergency Medicine | Admitting: Emergency Medicine

## 2016-06-17 ENCOUNTER — Encounter (HOSPITAL_COMMUNITY): Payer: Self-pay

## 2016-06-17 DIAGNOSIS — I110 Hypertensive heart disease with heart failure: Secondary | ICD-10-CM | POA: Insufficient documentation

## 2016-06-17 DIAGNOSIS — J441 Chronic obstructive pulmonary disease with (acute) exacerbation: Secondary | ICD-10-CM | POA: Diagnosis not present

## 2016-06-17 DIAGNOSIS — Z79899 Other long term (current) drug therapy: Secondary | ICD-10-CM | POA: Insufficient documentation

## 2016-06-17 DIAGNOSIS — I509 Heart failure, unspecified: Secondary | ICD-10-CM | POA: Insufficient documentation

## 2016-06-17 DIAGNOSIS — Z8673 Personal history of transient ischemic attack (TIA), and cerebral infarction without residual deficits: Secondary | ICD-10-CM | POA: Diagnosis not present

## 2016-06-17 DIAGNOSIS — R0602 Shortness of breath: Secondary | ICD-10-CM | POA: Diagnosis present

## 2016-06-17 DIAGNOSIS — E119 Type 2 diabetes mellitus without complications: Secondary | ICD-10-CM | POA: Insufficient documentation

## 2016-06-17 MED ORDER — LORAZEPAM 0.5 MG PO TABS
0.5000 mg | ORAL_TABLET | Freq: Once | ORAL | Status: AC
  Administered 2016-06-17: 0.5 mg via ORAL
  Filled 2016-06-17: qty 1

## 2016-06-17 MED ORDER — OXYMETAZOLINE HCL 0.05 % NA SOLN
1.0000 | Freq: Once | NASAL | Status: AC
  Administered 2016-06-17: 1 via NASAL
  Filled 2016-06-17: qty 15

## 2016-06-17 MED ORDER — IPRATROPIUM-ALBUTEROL 0.5-2.5 (3) MG/3ML IN SOLN
3.0000 mL | Freq: Once | RESPIRATORY_TRACT | Status: AC
  Administered 2016-06-17: 3 mL via RESPIRATORY_TRACT
  Filled 2016-06-17: qty 3

## 2016-06-17 MED ORDER — NICOTINE 21 MG/24HR TD PT24
21.0000 mg | MEDICATED_PATCH | Freq: Once | TRANSDERMAL | Status: DC
  Administered 2016-06-17: 21 mg via TRANSDERMAL
  Filled 2016-06-17: qty 1
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
## 2016-06-17 NOTE — Discharge Instructions (Signed)
Take your medications as prescribed. Please follow-up with your primary care provider in 2 days for recheck of symptoms. Please return the emergency Department if you develop any new or worsening symptoms.

## 2016-06-17 NOTE — ED Triage Notes (Signed)
Patient sen at [facility] ED yesterday and given inhaler and antibiotic. States that he is taking both as prescribed but feels thew same. Here with ongoing complaints of COPD exacerbation, hyperventilating on arrival. Continuing to smoke

## 2016-06-17 NOTE — ED Notes (Addendum)
Pt states has hx of panic attacks, takes methadone-- already went to the clinic for dosing this am, just started this week.  also takes percocet 7.5 -- took this am. Used to take Klonipin-- none in approx 1 year. Pt feels like he is having a panic attack.

## 2016-06-17 NOTE — ED Notes (Signed)
Pt yelling in room, pt states that he is just yelling, "I can't stay cooped up in here"  Ativan given

## 2016-06-17 NOTE — ED Notes (Signed)
Pt requesting a nicotine patch-- "I smoke 2 ppd, I need to quit"

## 2016-06-18 ENCOUNTER — Emergency Department (HOSPITAL_COMMUNITY)
Admission: EM | Admit: 2016-06-18 | Discharge: 2016-06-19 | Disposition: A | Discharge status: Home / Self Care | Payer: Medicaid Other | Attending: Physician Assistant | Admitting: Physician Assistant

## 2016-06-18 ENCOUNTER — Ambulatory Visit: Payer: Medicaid Other | Admitting: Podiatry

## 2016-06-18 DIAGNOSIS — Z8673 Personal history of transient ischemic attack (TIA), and cerebral infarction without residual deficits: Secondary | ICD-10-CM | POA: Insufficient documentation

## 2016-06-18 DIAGNOSIS — Z96662 Presence of left artificial ankle joint: Secondary | ICD-10-CM | POA: Insufficient documentation

## 2016-06-18 DIAGNOSIS — R0602 Shortness of breath: Secondary | ICD-10-CM | POA: Insufficient documentation

## 2016-06-18 DIAGNOSIS — Z79899 Other long term (current) drug therapy: Secondary | ICD-10-CM | POA: Insufficient documentation

## 2016-06-18 DIAGNOSIS — I110 Hypertensive heart disease with heart failure: Secondary | ICD-10-CM | POA: Insufficient documentation

## 2016-06-18 DIAGNOSIS — Z7902 Long term (current) use of antithrombotics/antiplatelets: Secondary | ICD-10-CM | POA: Insufficient documentation

## 2016-06-18 DIAGNOSIS — J449 Chronic obstructive pulmonary disease, unspecified: Secondary | ICD-10-CM | POA: Insufficient documentation

## 2016-06-18 DIAGNOSIS — F17210 Nicotine dependence, cigarettes, uncomplicated: Secondary | ICD-10-CM | POA: Insufficient documentation

## 2016-06-18 DIAGNOSIS — E119 Type 2 diabetes mellitus without complications: Secondary | ICD-10-CM | POA: Insufficient documentation

## 2016-06-18 DIAGNOSIS — I509 Heart failure, unspecified: Secondary | ICD-10-CM | POA: Insufficient documentation

## 2016-06-18 DIAGNOSIS — F419 Anxiety disorder, unspecified: Secondary | ICD-10-CM | POA: Insufficient documentation

## 2016-06-18 NOTE — ED Triage Notes (Signed)
Per EMS, pt coming from home. Pt complaining of shortness of breath. Has been seen at [facility] over the last few days for same. Pt has Hx of COPD. Pt on cell phone yelling that nobody is helping him and that he has "messed all over himself".

## 2016-06-19 ENCOUNTER — Encounter (HOSPITAL_COMMUNITY): Payer: Self-pay | Admitting: *Deleted

## 2016-06-19 ENCOUNTER — Encounter (HOSPITAL_COMMUNITY): Payer: Self-pay

## 2016-06-19 ENCOUNTER — Emergency Department (HOSPITAL_COMMUNITY)
Admission: EM | Admit: 2016-06-19 | Discharge: 2016-06-19 | Disposition: A | Discharge status: Home / Self Care | Payer: Medicaid Other | Source: Home / Self Care | Attending: Physician Assistant | Admitting: Physician Assistant

## 2016-06-19 ENCOUNTER — Emergency Department (HOSPITAL_COMMUNITY)
Admission: EM | Admit: 2016-06-19 | Discharge: 2016-06-19 | Disposition: A | Discharge status: Home / Self Care | Payer: Medicaid Other | Source: Home / Self Care | Attending: Emergency Medicine | Admitting: Emergency Medicine

## 2016-06-19 ENCOUNTER — Emergency Department (HOSPITAL_COMMUNITY): Payer: Medicaid Other

## 2016-06-19 DIAGNOSIS — R0602 Shortness of breath: Secondary | ICD-10-CM | POA: Diagnosis not present

## 2016-06-19 DIAGNOSIS — E119 Type 2 diabetes mellitus without complications: Secondary | ICD-10-CM | POA: Diagnosis not present

## 2016-06-19 DIAGNOSIS — J449 Chronic obstructive pulmonary disease, unspecified: Secondary | ICD-10-CM

## 2016-06-19 DIAGNOSIS — I110 Hypertensive heart disease with heart failure: Secondary | ICD-10-CM | POA: Diagnosis not present

## 2016-06-19 DIAGNOSIS — F17210 Nicotine dependence, cigarettes, uncomplicated: Secondary | ICD-10-CM | POA: Diagnosis not present

## 2016-06-19 DIAGNOSIS — Z96662 Presence of left artificial ankle joint: Secondary | ICD-10-CM | POA: Diagnosis not present

## 2016-06-19 DIAGNOSIS — F419 Anxiety disorder, unspecified: Secondary | ICD-10-CM | POA: Diagnosis present

## 2016-06-19 DIAGNOSIS — J00 Acute nasopharyngitis [common cold]: Secondary | ICD-10-CM

## 2016-06-19 DIAGNOSIS — Z8673 Personal history of transient ischemic attack (TIA), and cerebral infarction without residual deficits: Secondary | ICD-10-CM | POA: Diagnosis not present

## 2016-06-19 DIAGNOSIS — Z79899 Other long term (current) drug therapy: Secondary | ICD-10-CM | POA: Diagnosis not present

## 2016-06-19 DIAGNOSIS — Z7902 Long term (current) use of antithrombotics/antiplatelets: Secondary | ICD-10-CM | POA: Diagnosis not present

## 2016-06-19 DIAGNOSIS — I509 Heart failure, unspecified: Secondary | ICD-10-CM | POA: Diagnosis not present

## 2016-06-19 MED ORDER — IPRATROPIUM-ALBUTEROL 0.5-2.5 (3) MG/3ML IN SOLN
3.0000 mL | Freq: Once | RESPIRATORY_TRACT | Status: AC
  Administered 2016-06-19: 3 mL via RESPIRATORY_TRACT
  Filled 2016-06-19: qty 3

## 2016-06-19 MED ORDER — CETIRIZINE-PSEUDOEPHEDRINE ER 5-120 MG PO TB12: 1.0000 | ORAL_TABLET | Freq: Every day | ORAL | 0 refills | Status: DC

## 2016-06-19 MED ORDER — FLUTICASONE PROPIONATE 50 MCG/ACT NA SUSP: 2.0000 | Freq: Every day | NASAL | 1 refills | Status: DC

## 2016-06-19 NOTE — ED Notes (Signed)
On way to XR 

## 2016-06-19 NOTE — ED Notes (Signed)
Pt left AMA °

## 2016-06-19 NOTE — ED Notes (Signed)
Pt demanded to been seen. Told pt he would need to wait for a physician. Pt then said he was putting his clothes on and leaving.

## 2016-06-19 NOTE — ED Notes (Signed)
Pt given coke to drink 

## 2016-06-19 NOTE — ED Notes (Signed)
Pt states that he believes his SOB is mental.  He believes that since he used to be a heroin user and when he touches heroin, it makes him SOB.  Pt states he also has a hx of COPD.  Pt began taking methadone 1.5 weeks ago, and said that he found out today that methadone has same ingredients that heroin does so that is why he is SOB today.

## 2016-06-19 NOTE — ED Notes (Addendum)
Pt in and out of room yelling at staff saying "yall are treating me like I'm crazy, I'm not crazy. I need a breathing treatment". Advised pt that if he is actually having trouble breathing, he needs to sit down and stop yelling.
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
## 2016-06-19 NOTE — ED Notes (Signed)
Pt ambulated in hall. Oxygen saturation stayed at 96-97% RA.  Pt getting dressed for discharge.

## 2016-06-19 NOTE — ED Triage Notes (Signed)
Pt in reports anxiety & SOB after taking Methadone, pt seen here today for same complaint, pt vitals WNL, pt states, "I am to old for this I want something to get it out of my system." pt reports taking Methadone for 1.5 wk, pt reports being started on Methadone last Thursday and has had med reduced recently, pt shaking in triage, A&O x4

## 2016-06-19 NOTE — ED Triage Notes (Addendum)
Pt presents for evaluation of URI x multiple days. Has been seen x 3 at [facility] ED for same. Reports given prescriptions and couldn't find meds this AM. Pt talkative, interactive in triage. Pt ambulatory to room with no dyspnea.

## 2016-06-19 NOTE — ED Notes (Signed)
Pt walking around in room yelling on the phone to family members begging them to come to the hospital. Advised pt that if he is having trouble breathing, he needs to sit down and stay off the phone

## 2016-06-19 NOTE — ED Notes (Signed)
Pt walked out prior to getting discharge papers.

## 2016-06-21 ENCOUNTER — Emergency Department (HOSPITAL_COMMUNITY)
Admission: EM | Admit: 2016-06-21 | Discharge: 2016-06-21 | Disposition: A | Discharge status: Home / Self Care | Payer: Medicaid Other | Attending: Emergency Medicine | Admitting: Emergency Medicine

## 2016-06-21 ENCOUNTER — Other Ambulatory Visit: Payer: Self-pay

## 2016-06-21 ENCOUNTER — Encounter (HOSPITAL_COMMUNITY): Payer: Self-pay | Admitting: *Deleted

## 2016-06-21 ENCOUNTER — Ambulatory Visit (HOSPITAL_COMMUNITY)
Admission: EM | Admit: 2016-06-21 | Discharge: 2016-06-21 | Disposition: A | Discharge status: Other Acute Inpatient Hospital | Payer: Medicaid Other | Attending: Internal Medicine | Admitting: Internal Medicine

## 2016-06-21 ENCOUNTER — Encounter (HOSPITAL_COMMUNITY): Payer: Self-pay

## 2016-06-21 ENCOUNTER — Emergency Department (HOSPITAL_COMMUNITY): Payer: Medicaid Other

## 2016-06-21 DIAGNOSIS — F419 Anxiety disorder, unspecified: Secondary | ICD-10-CM | POA: Diagnosis not present

## 2016-06-21 DIAGNOSIS — J449 Chronic obstructive pulmonary disease, unspecified: Secondary | ICD-10-CM | POA: Diagnosis not present

## 2016-06-21 DIAGNOSIS — R110 Nausea: Secondary | ICD-10-CM

## 2016-06-21 DIAGNOSIS — J45909 Unspecified asthma, uncomplicated: Secondary | ICD-10-CM | POA: Diagnosis not present

## 2016-06-21 DIAGNOSIS — Z951 Presence of aortocoronary bypass graft: Secondary | ICD-10-CM | POA: Diagnosis not present

## 2016-06-21 DIAGNOSIS — F1123 Opioid dependence with withdrawal: Secondary | ICD-10-CM

## 2016-06-21 DIAGNOSIS — Z7902 Long term (current) use of antithrombotics/antiplatelets: Secondary | ICD-10-CM | POA: Diagnosis not present

## 2016-06-21 DIAGNOSIS — Z79899 Other long term (current) drug therapy: Secondary | ICD-10-CM | POA: Insufficient documentation

## 2016-06-21 DIAGNOSIS — I509 Heart failure, unspecified: Secondary | ICD-10-CM | POA: Insufficient documentation

## 2016-06-21 DIAGNOSIS — E119 Type 2 diabetes mellitus without complications: Secondary | ICD-10-CM | POA: Diagnosis not present

## 2016-06-21 DIAGNOSIS — F1410 Cocaine abuse, uncomplicated: Secondary | ICD-10-CM | POA: Insufficient documentation

## 2016-06-21 DIAGNOSIS — R000 Tachycardia, unspecified: Secondary | ICD-10-CM | POA: Insufficient documentation

## 2016-06-21 DIAGNOSIS — F17210 Nicotine dependence, cigarettes, uncomplicated: Secondary | ICD-10-CM | POA: Insufficient documentation

## 2016-06-21 DIAGNOSIS — Z96662 Presence of left artificial ankle joint: Secondary | ICD-10-CM | POA: Insufficient documentation

## 2016-06-21 DIAGNOSIS — F1193 Opioid use, unspecified with withdrawal: Secondary | ICD-10-CM

## 2016-06-21 LAB — CBC WITH DIFFERENTIAL/PLATELET
Basophils Absolute: 0 10*3/uL (ref 0.0–0.1)
Basophils Relative: 0 %
Eosinophils Absolute: 0.3 10*3/uL (ref 0.0–0.7)
Eosinophils Relative: 2 %
HEMATOCRIT: 46.2 % (ref 39.0–52.0)
HEMOGLOBIN: 16.2 g/dL (ref 13.0–17.0)
LYMPHS ABS: 3.1 10*3/uL (ref 0.7–4.0)
LYMPHS PCT: 22 %
MCH: 31.9 pg (ref 26.0–34.0)
MCHC: 35.1 g/dL (ref 30.0–36.0)
MCV: 90.9 fL (ref 78.0–100.0)
Monocytes Absolute: 1.3 10*3/uL — ABNORMAL HIGH (ref 0.1–1.0)
Monocytes Relative: 9 %
NEUTROS ABS: 9.5 10*3/uL — AB (ref 1.7–7.7)
NEUTROS PCT: 67 %
Platelets: 358 10*3/uL (ref 150–400)
RBC: 5.08 MIL/uL (ref 4.22–5.81)
RDW: 12.9 % (ref 11.5–15.5)
WBC: 14.2 10*3/uL — AB (ref 4.0–10.5)

## 2016-06-21 LAB — RAPID URINE DRUG SCREEN, HOSP PERFORMED
Amphetamines: NOT DETECTED
Barbiturates: NOT DETECTED
Benzodiazepines: NOT DETECTED
COCAINE: POSITIVE — AB
OPIATES: NOT DETECTED
TETRAHYDROCANNABINOL: NOT DETECTED

## 2016-06-21 LAB — BASIC METABOLIC PANEL
Anion gap: 11 (ref 5–15)
BUN: 18 mg/dL (ref 6–20)
CHLORIDE: 99 mmol/L — AB (ref 101–111)
CO2: 23 mmol/L (ref 22–32)
Calcium: 9.1 mg/dL (ref 8.9–10.3)
Creatinine, Ser: 1.13 mg/dL (ref 0.61–1.24)
GFR calc Af Amer: >60 mL/min (ref >60)
GFR calc non Af Amer: >60 mL/min (ref >60)
Glucose, Bld: 129 mg/dL — ABNORMAL HIGH (ref 65–99)
POTASSIUM: 3.7 mmol/L (ref 3.5–5.1)
SODIUM: 133 mmol/L — AB (ref 135–145)

## 2016-06-21 LAB — I-STAT TROPONIN, ED: TROPONIN I, POC: 0 ng/mL (ref 0.00–0.08)

## 2016-06-21 LAB — D-DIMER, QUANTITATIVE: D-Dimer, Quant: 0.29 ug/mL-FEU (ref 0.00–0.50)

## 2016-06-21 MED ORDER — LORAZEPAM 2 MG/ML IJ SOLN: 1.0000 mg | Freq: Once | INTRAMUSCULAR | Status: DC

## 2016-06-21 MED ORDER — LORAZEPAM 1 MG PO TABS
1.0000 mg | ORAL_TABLET | Freq: Once | ORAL | Status: AC
  Administered 2016-06-21: 1 mg via ORAL
  Filled 2016-06-21: qty 1

## 2016-06-21 MED ORDER — SODIUM CHLORIDE 0.9 % IV BOLUS (SEPSIS): 500.0000 mL | Freq: Once | INTRAVENOUS | Status: DC

## 2016-06-21 NOTE — Discharge Instructions (Signed)
Stop using cocaine.  It will cause your heart rate to go up and make you feel anxious.  Follow up with your family doctor for recheck.
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
## 2016-06-21 NOTE — ED Triage Notes (Signed)
Pt  Reports    He  Stopped   Methadone           3  Days  Ago    Pt  States  Last    Methadone   3  Days    Ago          Seen  In  Er   2  Days     Pt  anxious

## 2016-06-21 NOTE — ED Triage Notes (Addendum)
Patient states that he was directed here from urgent care for high HR and ongoing issues related to COPD. 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 Denies any drug use and denies etoh. Did have 1 episode of CP while triaging

## 2016-06-21 NOTE — ED Notes (Signed)
Pt understood dc material. NAD noted. 

## 2016-06-21 NOTE — ED Notes (Signed)
Pt ambulated independently with a stead gait. O2 stayed between 90-94.
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
## 2016-07-11 IMAGING — CR DG CHEST 1V PORT
2 series · 2 of 2 positions shown · non-contrast
Comparison: 02/14/2014

CLINICAL DATA: Patient was found slumped over in the bathroom.
Cough. History of diabetes, hypertension, COPD.

EXAM:
PORTABLE CHEST - 1 VIEW

[AP (1 of 2)]
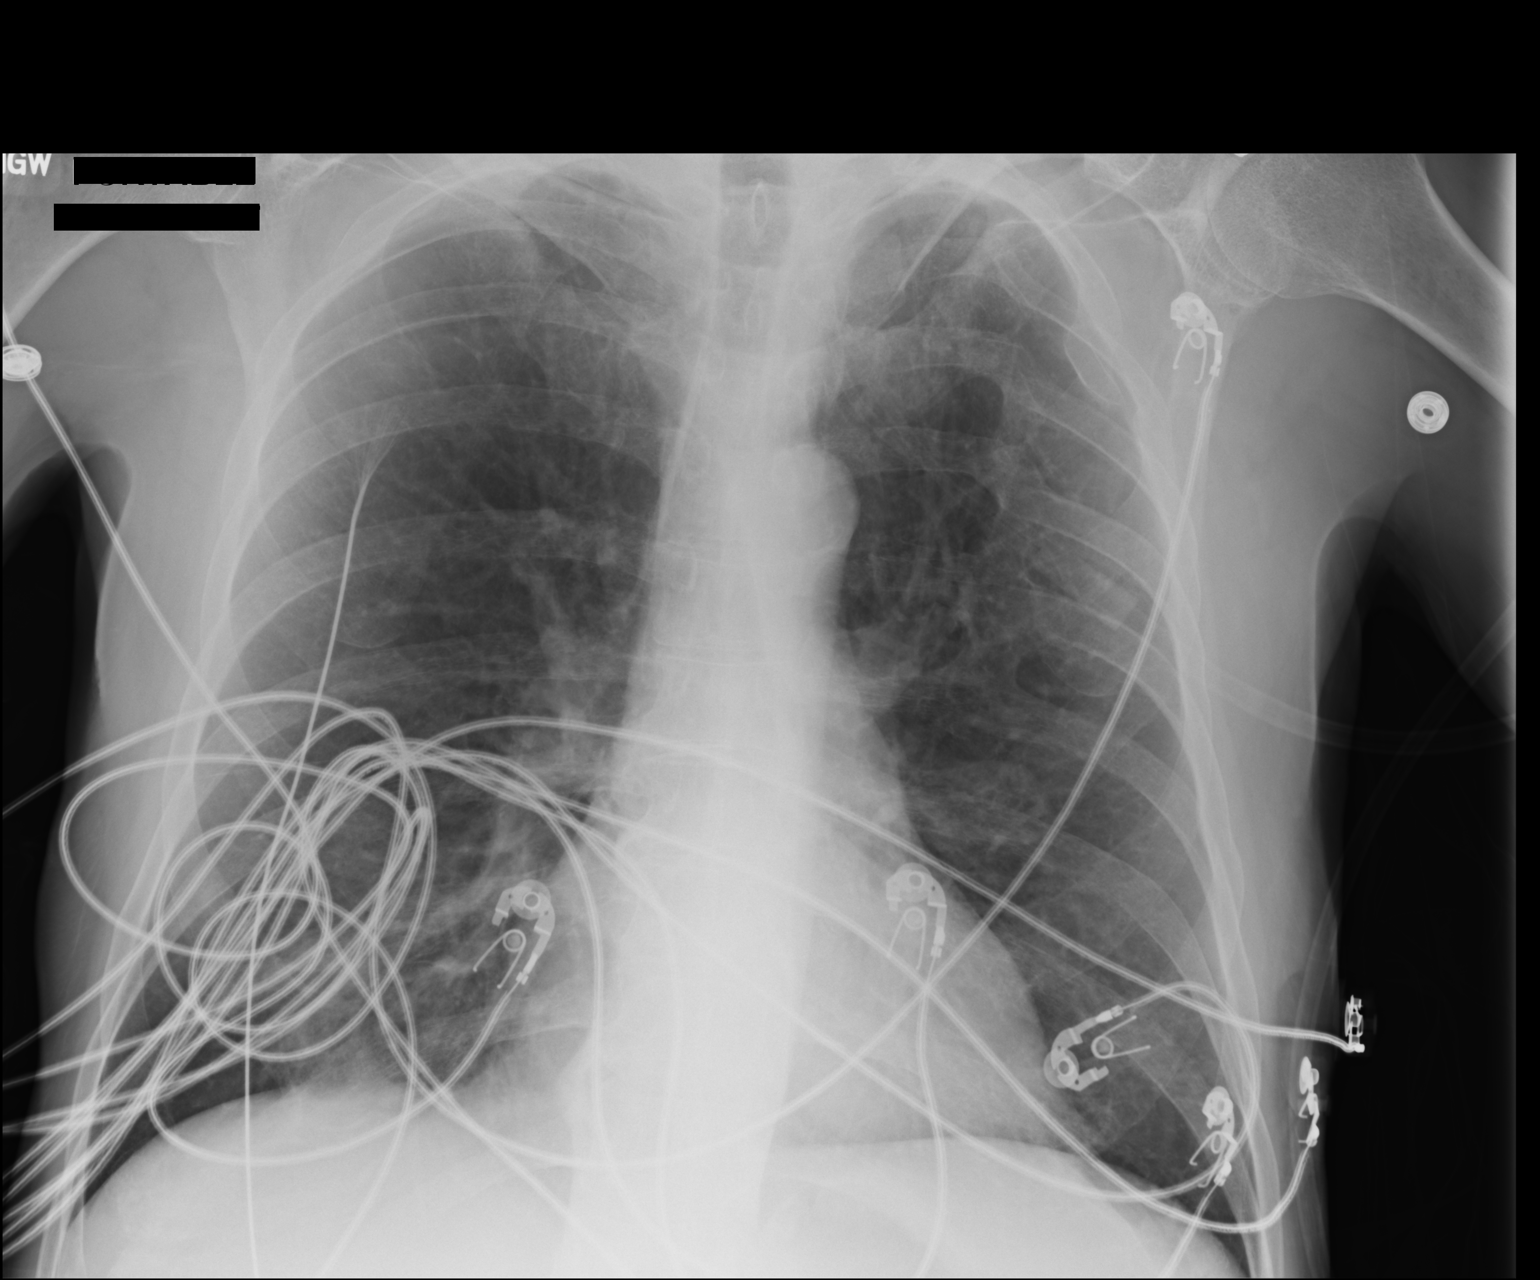

[AP (2 of 2)]
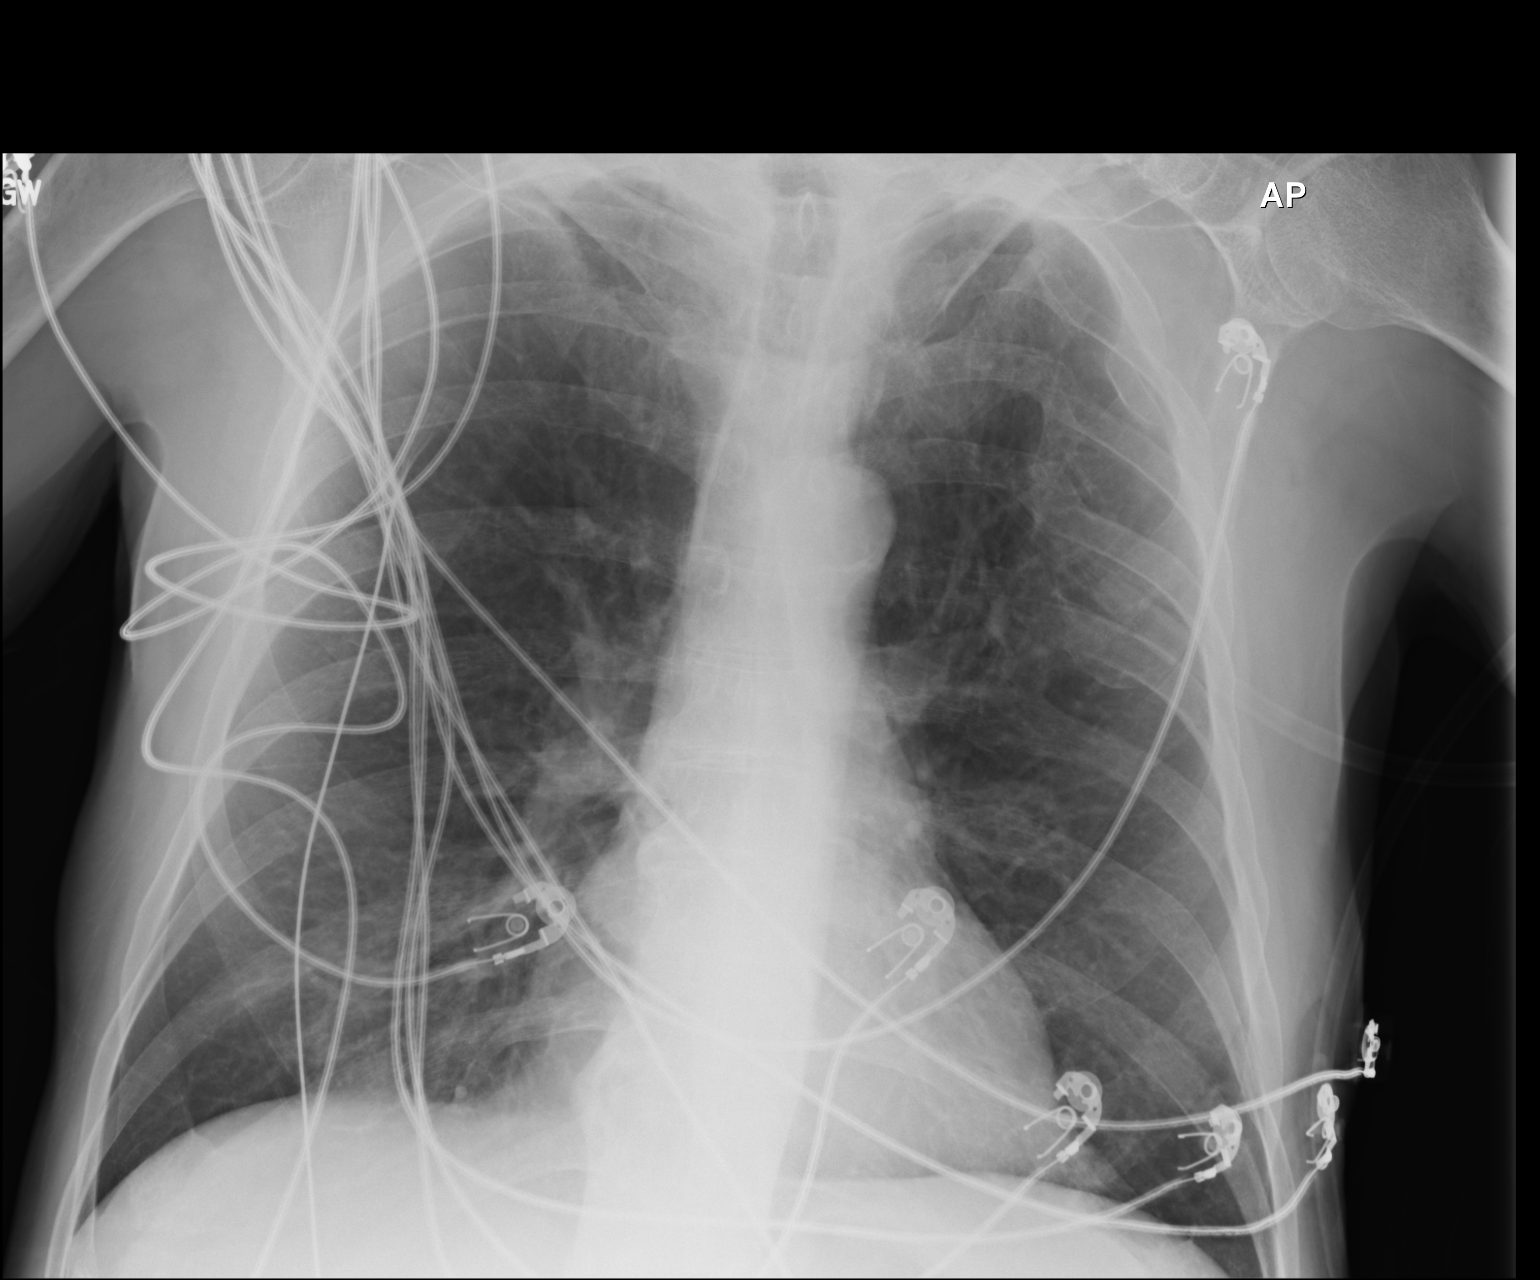

[2 of 2 positions shown; findings below may reference images not displayed]

FINDINGS: Examination is technically limited due to superimposed EKG wires.
Emphysematous changes in the chest. No focal airspace disease or
consolidation. No pneumothorax. No blunting of costophrenic angles.
Normal heart size and pulmonary vascularity. Multiple old left rib
fractures. Old left clavicular fracture. Postoperative changes in
the cervical spine.
IMPRESSION: No active disease.

## 2016-07-19 IMAGING — US IR US GUIDE VASC ACCESS LEFT
1 series · 2 of 2 positions shown · non-contrast
Comparison: PRIOR ABDOMINAL CT 11/22/2011, 12/01/2013

INDICATION: 53-year-old male with a history of right-sided femoral popliteal
bypass occlusion.
TECHNIQUE: Informed written consent was obtained from the patient after a
discussion of the risks, benefits and alternatives to treatment.
Questions regarding the procedure were encouraged and answered. A
timeout was performed prior to the initiation of the procedure.

[Series 1: ir angio/ext/uni*right* · 2 of 2 slices shown]
[im 1/2]
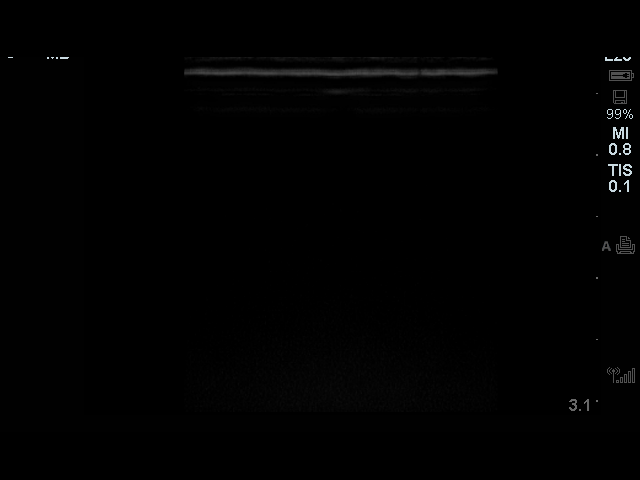
[im 2/2]
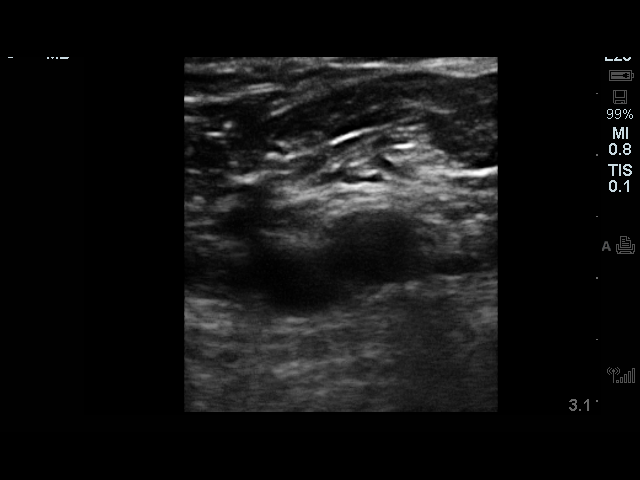

[2 of 2 positions shown; findings below may reference images not displayed]

The patient had a autologous vein graft bypass completed in 6888
which occluded in 6545. At the time of this occlusion, the vein
graft was excluded with placement of a new Gore graft.

The patient experienced new onset right foot pain 2 days before his
presentation to his vascular surgeon on 03/23/2014. Upon
presentation, there is concern for acute thrombosis of the graft.

The patient has been referred for evaluation of
thrombolysis/thrombectomy.

The patient has a history complicated by a brain abscess with
surgical evacuation Tuesday November, 2013. This is less than 6 months ago
though greater than 3 months ago. The patient would be higher than
usual risk for brain hemorrhage if thrombo lysis was initiated.

EXAM:
1. ULTRASOUND GUIDED ACCESS LEFT COMMON FEMORAL ARTERY
2. PELVIC ANGIOGRAM
3. RIGHT LOWER EXTREMITY ANGIOGRAM
4. MECHANICAL/ASPIRATION THROMBECTOMY OF RIGHT FEMORAL POPLITEAL
BYPASS GRAFT
5. BALLOON ANGIOPLASTY OF FEMORAL POPLITEAL BYPASS
MEDICATIONS:
Versed 4.5 mg IV; Fentanyl 225 mcg IV; 76666 UNITS HEPARIN

CONTRAST:  125mL VISIPAQUE IODIXANOL 320 MG/ML IV SOLN

ANESTHESIA/SEDATION:
One hundred eighty minutes

FLUOROSCOPY TIME:  Forty-six minutes.  Fifty-four seconds.

ACCESS:
Right common femoral artery;

COMPLICATIONS:
None
Ultrasound survey of the right inguinal region was performed with
images stored and sent to PACs.

A micropuncture needle was used access the right common femoral
artery under ultrasound. With excellent arterial blood flow returned
common L1-8 micro wire was passed through the needle, observed enter
the abdominal aorta under fluoroscopy. The needle was removed, and a
micropuncture sheath was placed over the wire. The inner dilator and
wire were removed, and an 035 Bentson wire was advanced under
fluoroscopy into the abdominal aorta. The sheath was removed and a
standard 5 French vascular sheath was placed. The dilator was
removed and the sheath was flushed.

Pigtail catheter was advanced over the Bentson wire into the lower
abdominal aorta. Pelvic angiogram was performed.

The pigtail catheter was removed the Bentson wire and an Omni Flush
catheter was used to access the right common iliac artery. The
Bentson wire was advanced into the distal external iliac artery and
the Omni Flush catheter was removed.

C2 Cobra catheter was advanced over the Bentson wire into the
external iliac artery. Wire was removed and then a right lower
extremity runoff angiogram was performed, including AP and lateral
views of the foot.

Amplatz wire was advanced to the Cobra catheter to the distal
external iliac artery an the catheter was removed. The short 5
French sheath was then removed and a 6 French 45cm destination
sheath was placed, with the tip terminating in the extra iliac
artery. The dilator was removed and the side arm was attached to
heparinized flush back.

Initial heparin bolus of 8222 units was administered.

Combination of a Glidewire and angled catheter were used access the
origin of the femoral popliteal bypass. The catheter was removed and
then the penumbra CAT6 system was advanced over the wire into the
fem-pop graft. Aspiration thrombectomy was initiated. The separator
was used.

Aspiration was performed of the length of the femoral popliteal
graft to the distal aspect.

Upon encountering resistance at the distal aspect, gentle angiogram
of the length of the femoral popliteal graft was performed. Distal
occlusion was identified below the knee joint.

The Glidewire was placed through the aspiration catheter, which was
subsequently removed. A combination of an 035 CXI catheter and the
Glidewire was then used in an attempt to cross the distal occlusion.
Several other crossing wires including Neffets wire (treasure 12),
[place], and Hamidou Yilmaz. Crossing wires were used in attempt to
cross the occlusion at the distal fem-pop. This was unsuccessful.

The Rosen wire was advanced to the distal occlusion and balloon
angioplasty along the length of the fem-pop grass was performed
including the origin.

Catheters and wires were then removed, the destination sheath was
withdrawn to the left external iliac artery, and a limited left
common femoral artery angiogram performed. Bentson wire was then
advanced and the destination sheath was advanced for a 6 French
short sheath.

Patient tolerated procedure well and remained hemodynamically stable
throughout.

No complications were encountered and no significant blood loss was
encountered.

EBL equals 0.
FINDINGS: Pelvic angiogram:

Atherosclerotic changes of the distal aorta without aneurysm or
dissection flap.

Diffuse atherosclerotic changes with calcifications of the bilateral
common iliac and external iliac arteries. Hypogastric arteries are
patent bilaterally with atherosclerotic changes. No aneurysm
identified.

Right lower extremity angiogram:

Surgical changes of prior fem-pop bypass, with proximal occlusion of
the bypass.

Patency of the profunda femoris with greater than 50% stenosis at
the origin. Patency of the SFA stump contributing to thigh
collaterals. Below the first 5 cm, the SFA is occluded.

Multiple collaterals fill the thigh and geniculate arteries.

Below the knee there is no identification of the distal popliteal
artery or tibioperoneal trunk.

Anterior tibial artery fills via geniculate collaterals, which is
patent from the first third to the ankle. The peroneal artery fills
via the connect with collaterals, patent at the ankle.

No segments of the posterior tibial artery are patent.

Angiogram left common femoral artery:

Left common femoral artery is patent, as well as the origin of the
left profunda femoris and SFA. Puncture site is below the inferior
epigastric artery and above the SFA.

Occlusion of the distal fem-pop artery below the knee. This seems to
be at the level of the prior autologous vein graft.
IMPRESSION: Status post pelvic angiogram and right lower extremity angiogram,
with aspiration/mechanical thrombectomy of occluded right femoral
popliteal graft. The graft remains occluded at the distal
anastomosis which could not be crossed with endovascular means.

Right profunda femoris remains patent with a greater than 50%
stenosis at the origin. Right SFA stump contributes to additional
thigh collaterals.

Anterior tibial artery and peroneal artery are patent to the ankle,
collateralized by geniculate arteries.

These results were called by telephone by Dr. Ayaz to Dr. Spivak
Lmeryoul who verbally acknowledged these results.

PLAN:
Status post angiogram, the patient will maintain the left common
femoral artery sheath until we establish a surgical disposition.

The patient will remain on heparin drip until we establish a
surgical disposition.

The patient will be NPO and bedrest until disposition.

## 2016-07-20 IMAGING — CT CT HEAD WO/W CM
1 of 2 series · 13 of 30 positions shown, 17 images · IV contrast (75CC ISOVUE 300)
Comparison: Multiple priors, most recent noncontrast CT 01/03/2014.
Brain MRI 12/01/2013.

CLINICAL DATA: Followup brain abscesses. Cultures positive for
microaerophilic Streptococcus. Subsequent encounter.

EXAM:
CT HEAD WITHOUT AND WITH CONTRAST
TECHNIQUE: Contiguous axial images were obtained from the base of the skull
through the vertex without and with intravenous contrast
CONTRAST:  Isovue 300, 75 mL.

[Series 32: 3d filtered head w/o · axial · non-contrast · 0.49mm/px · z∈[+14,+146]mm · 13 of 28 slices shown, 17 images]
[im 2/28  brain]
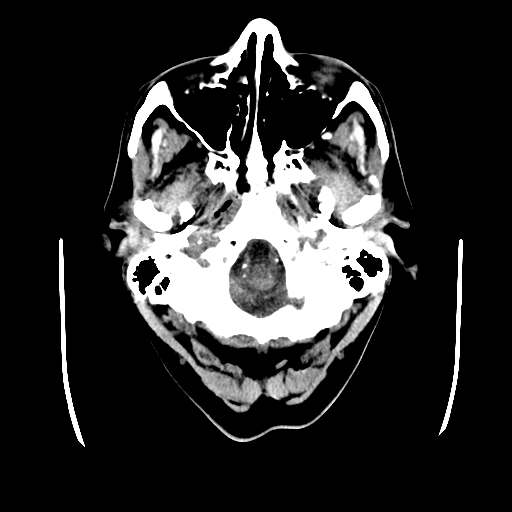
[im 2/28  bone]
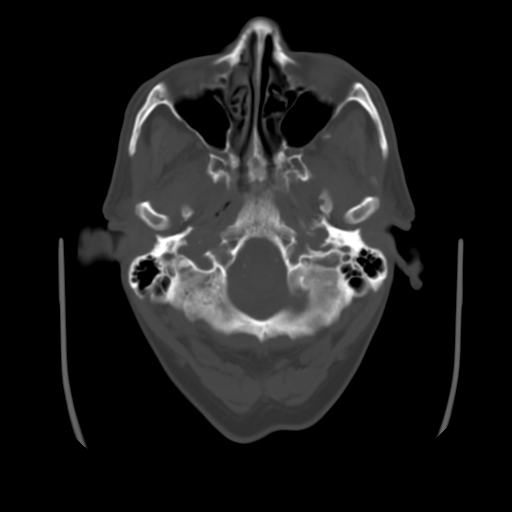
[im 4/28  brain]
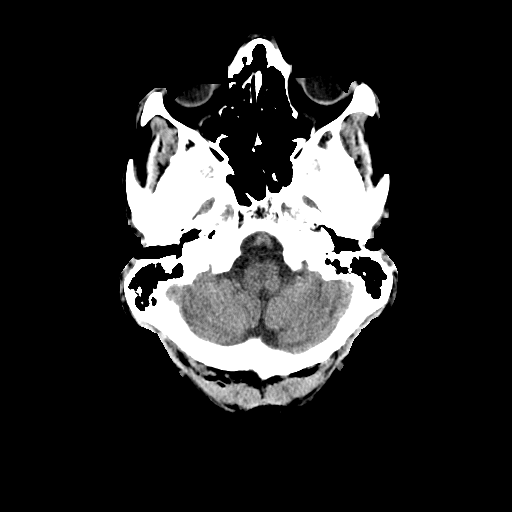
[im 6/28  brain]
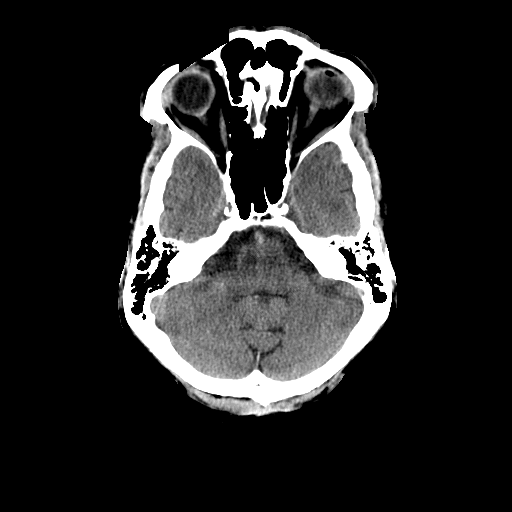
[im 8/28  brain]
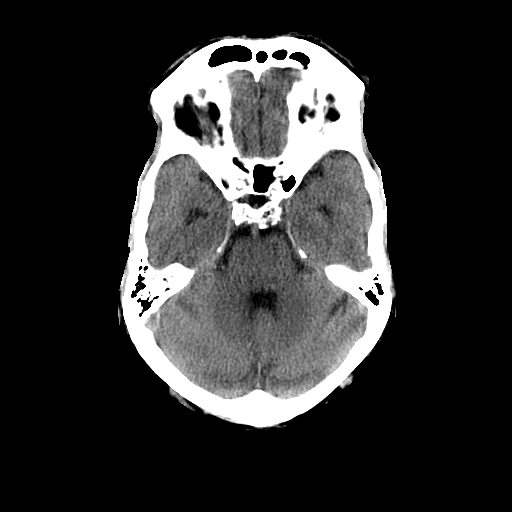
[im 10/28  brain]
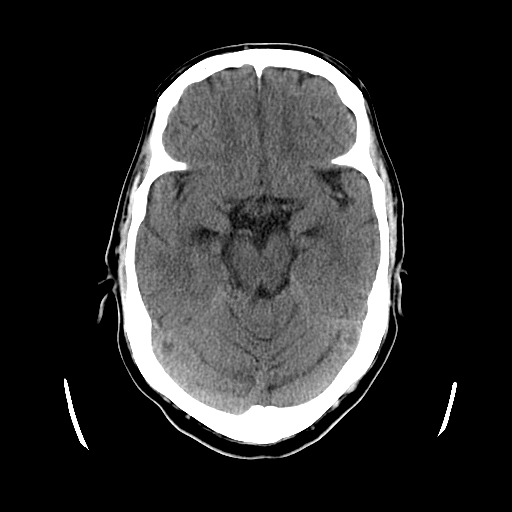
[im 10/28  bone]
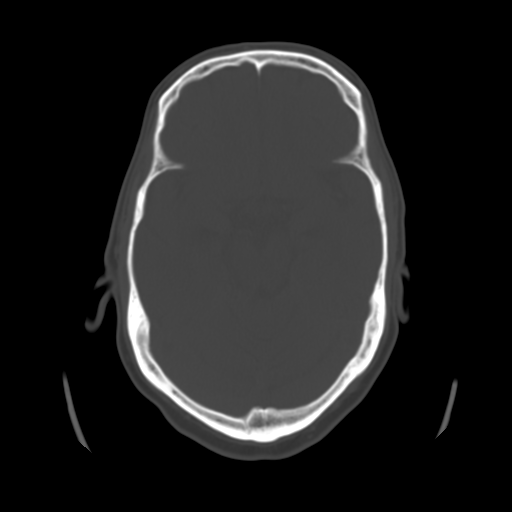
[im 12/28  brain]
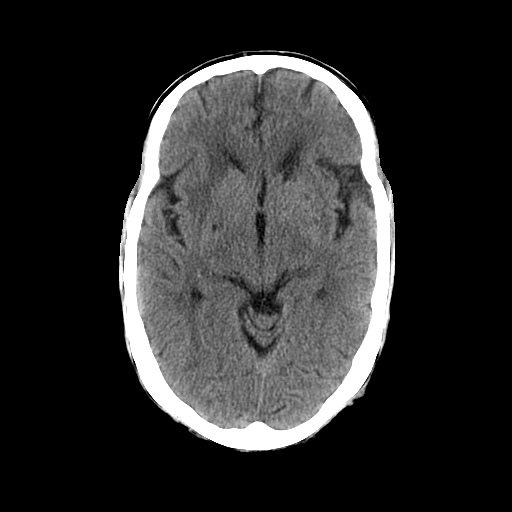
[im 14/28  brain]
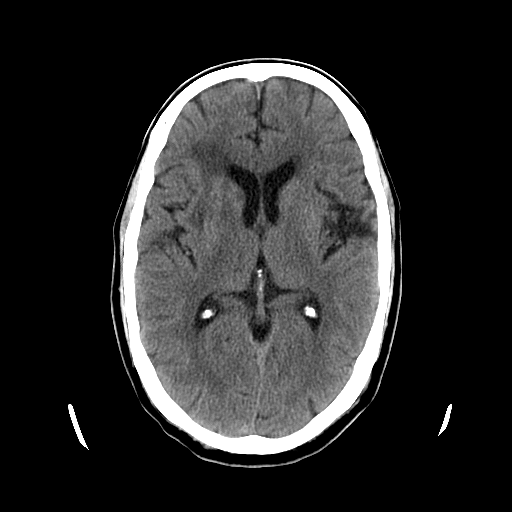
[im 16/28  brain]
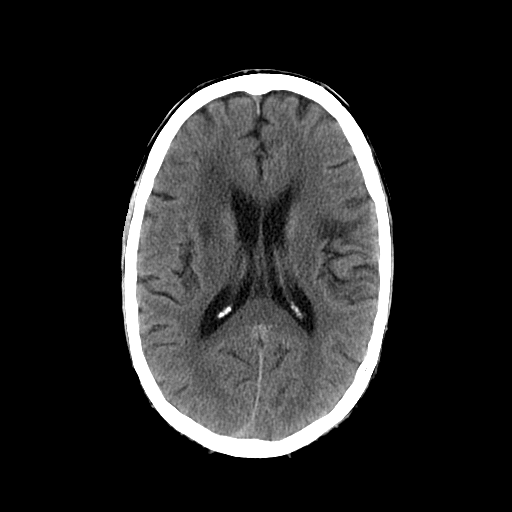
[im 18/28  brain]
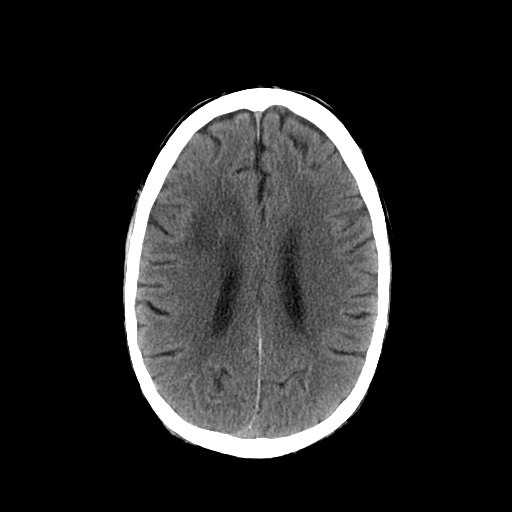
[im 18/28  bone]
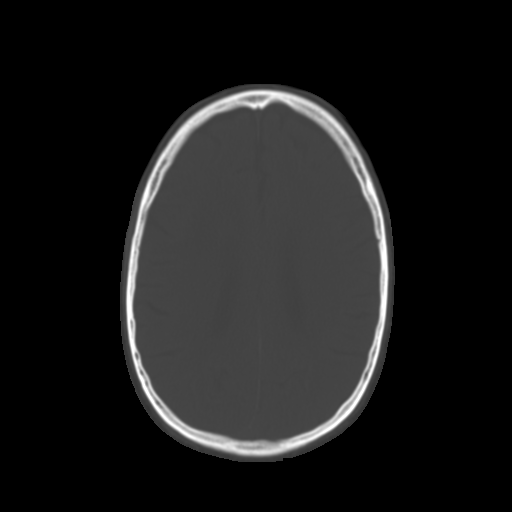
[im 20/28  brain]
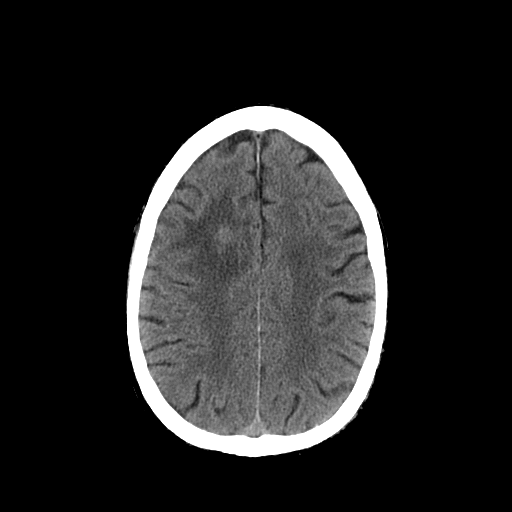
[im 22/28  brain]
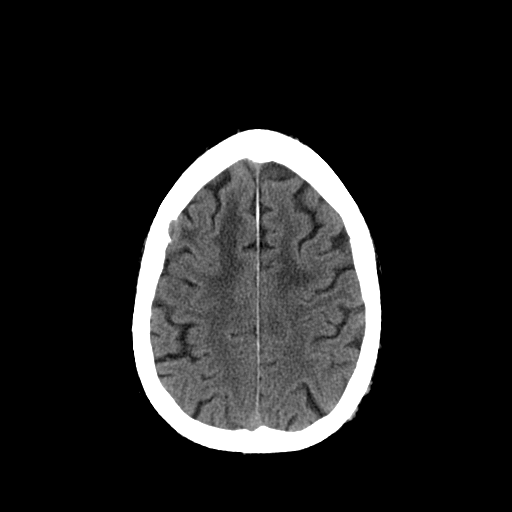
[im 24/28  brain]
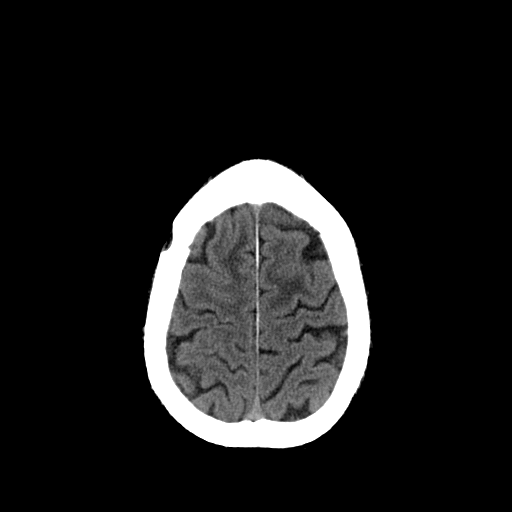
[im 26/28  brain]
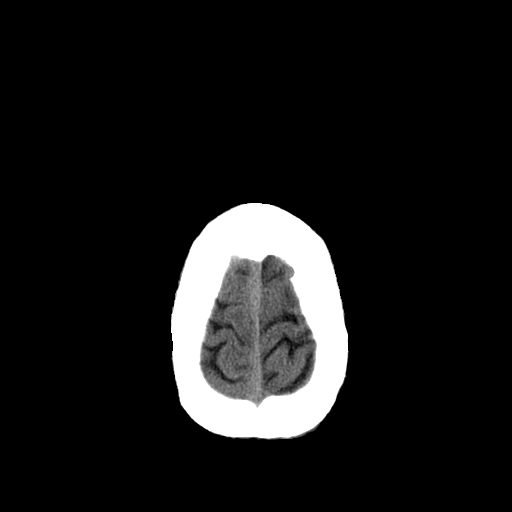
[im 26/28  bone]
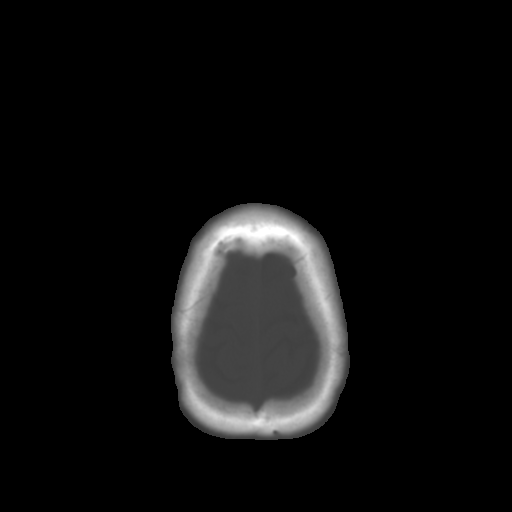

[13 of 30 positions shown; findings below may reference images not displayed]

FINDINGS: Three enhancing intracranial lesions remain, smaller in size than
the previously noted lesions. These include:

- RIGHT lentiform nucleus/anterior internal capsule, 9 x 14 mm
cross-section, image 14.

- RIGHT frontal periventricular white matter near the forceps minor,
9 x 9 mm cross-section, image 20.

- LEFT frontal subcortical white matter, 9 x 9 mm cross-section,
image 25.

- possible 1 mm focus versus enhancing vessel medial LEFT frontal
parasagittal cortex, image 22 requires continued surveillance.

Moderate vasogenic edema accompanies the RIGHT frontal and LEFT
frontal lesions. No midline shift.

Generalized atrophy. Chronic microvascular ischemic changes
superimposed of a mild degree. Vascular calcification. No sinus or
mastoid disease. Calvarium intact.
IMPRESSION: Residual brain abscesses persist but are smaller compared with most
recent priors. Continued surveillance is warranted.

Noncontrast imaging does not appear to be helpful in assessment of
lesion progression; post infusion MR or CT is necessary to assess
lesions size.

## 2016-07-21 ENCOUNTER — Other Ambulatory Visit: Payer: Self-pay

## 2016-07-21 DIAGNOSIS — I739 Peripheral vascular disease, unspecified: Secondary | ICD-10-CM

## 2016-07-21 MED ORDER — CLOPIDOGREL BISULFATE 75 MG PO TABS: 75.0000 mg | ORAL_TABLET | Freq: Every day | ORAL | 6 refills | Status: DC

## 2016-09-02 IMAGING — CR DG HAND COMPLETE 3+V*R*
3 series · 3 of 3 positions shown · non-contrast
Comparison: 06/09/2013

CLINICAL DATA: Fall.  Hand pain and swelling

EXAM:
RIGHT HAND - COMPLETE 3+ VIEW

[view not recorded (1 of 3)]
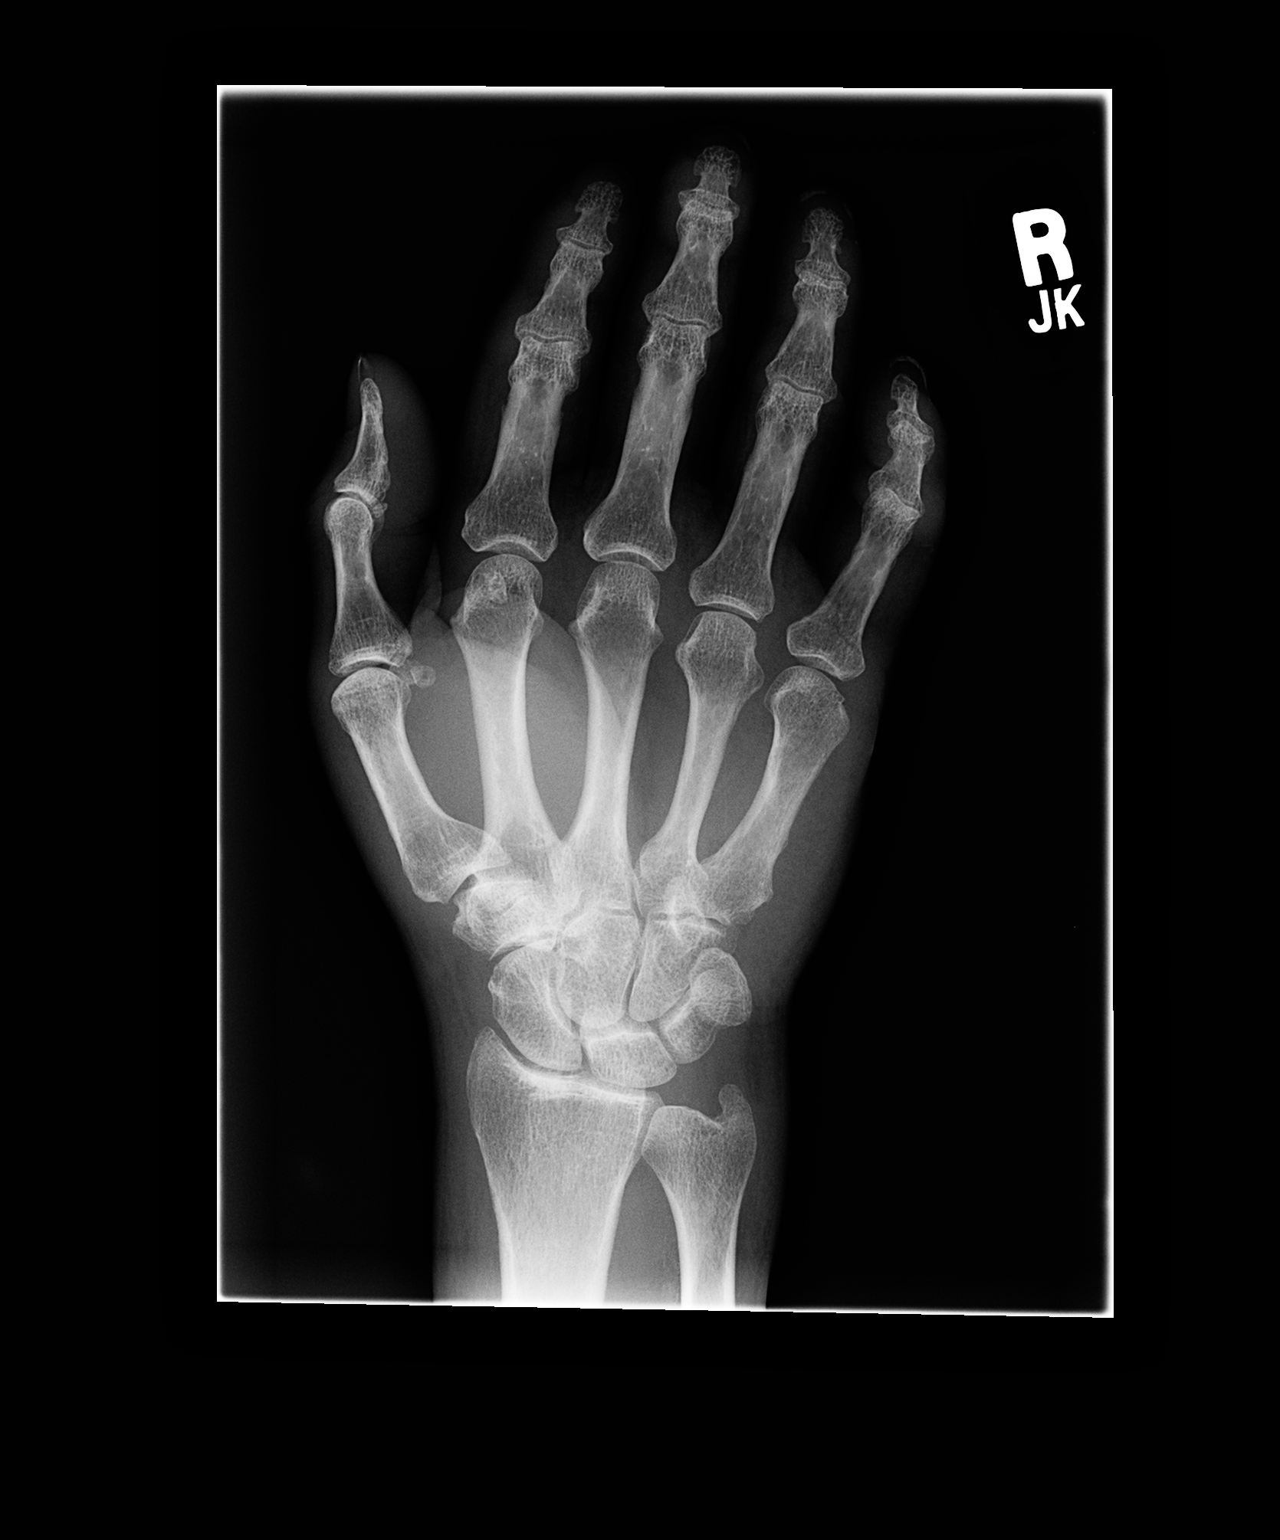

[view not recorded (2 of 3)]
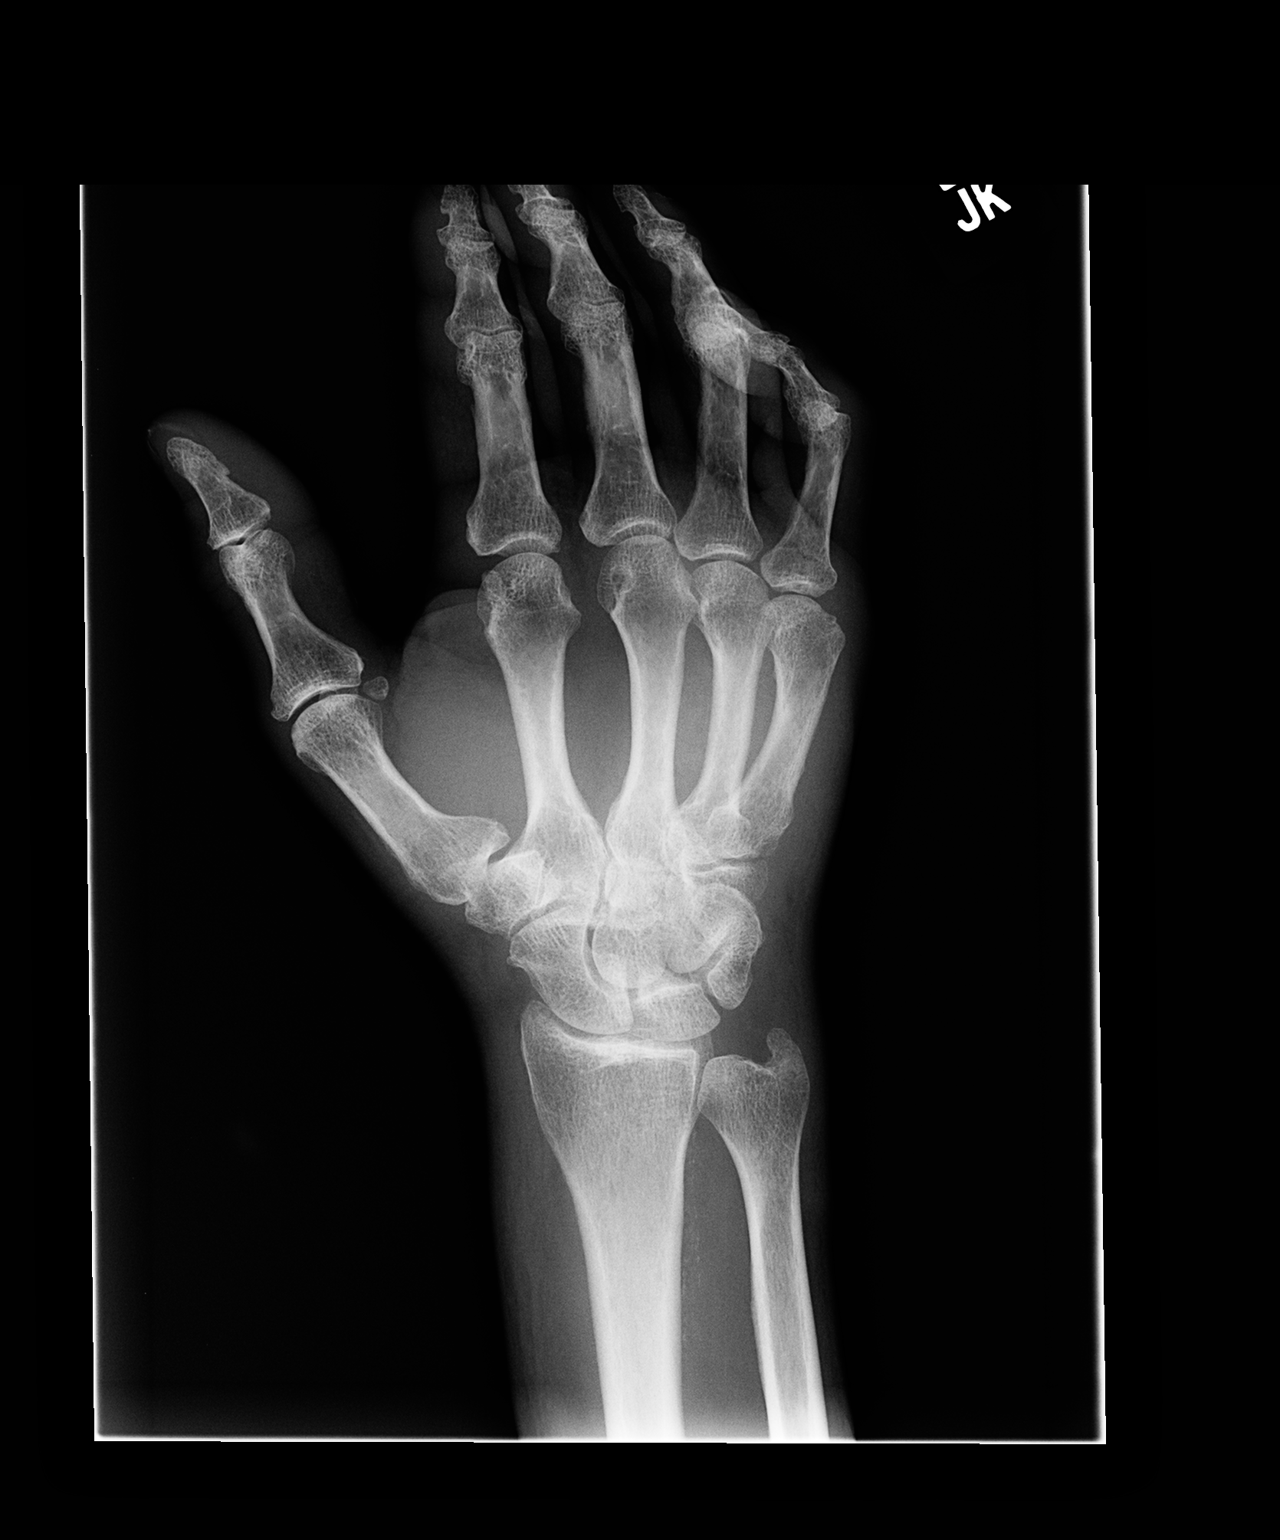

[view not recorded (3 of 3)]
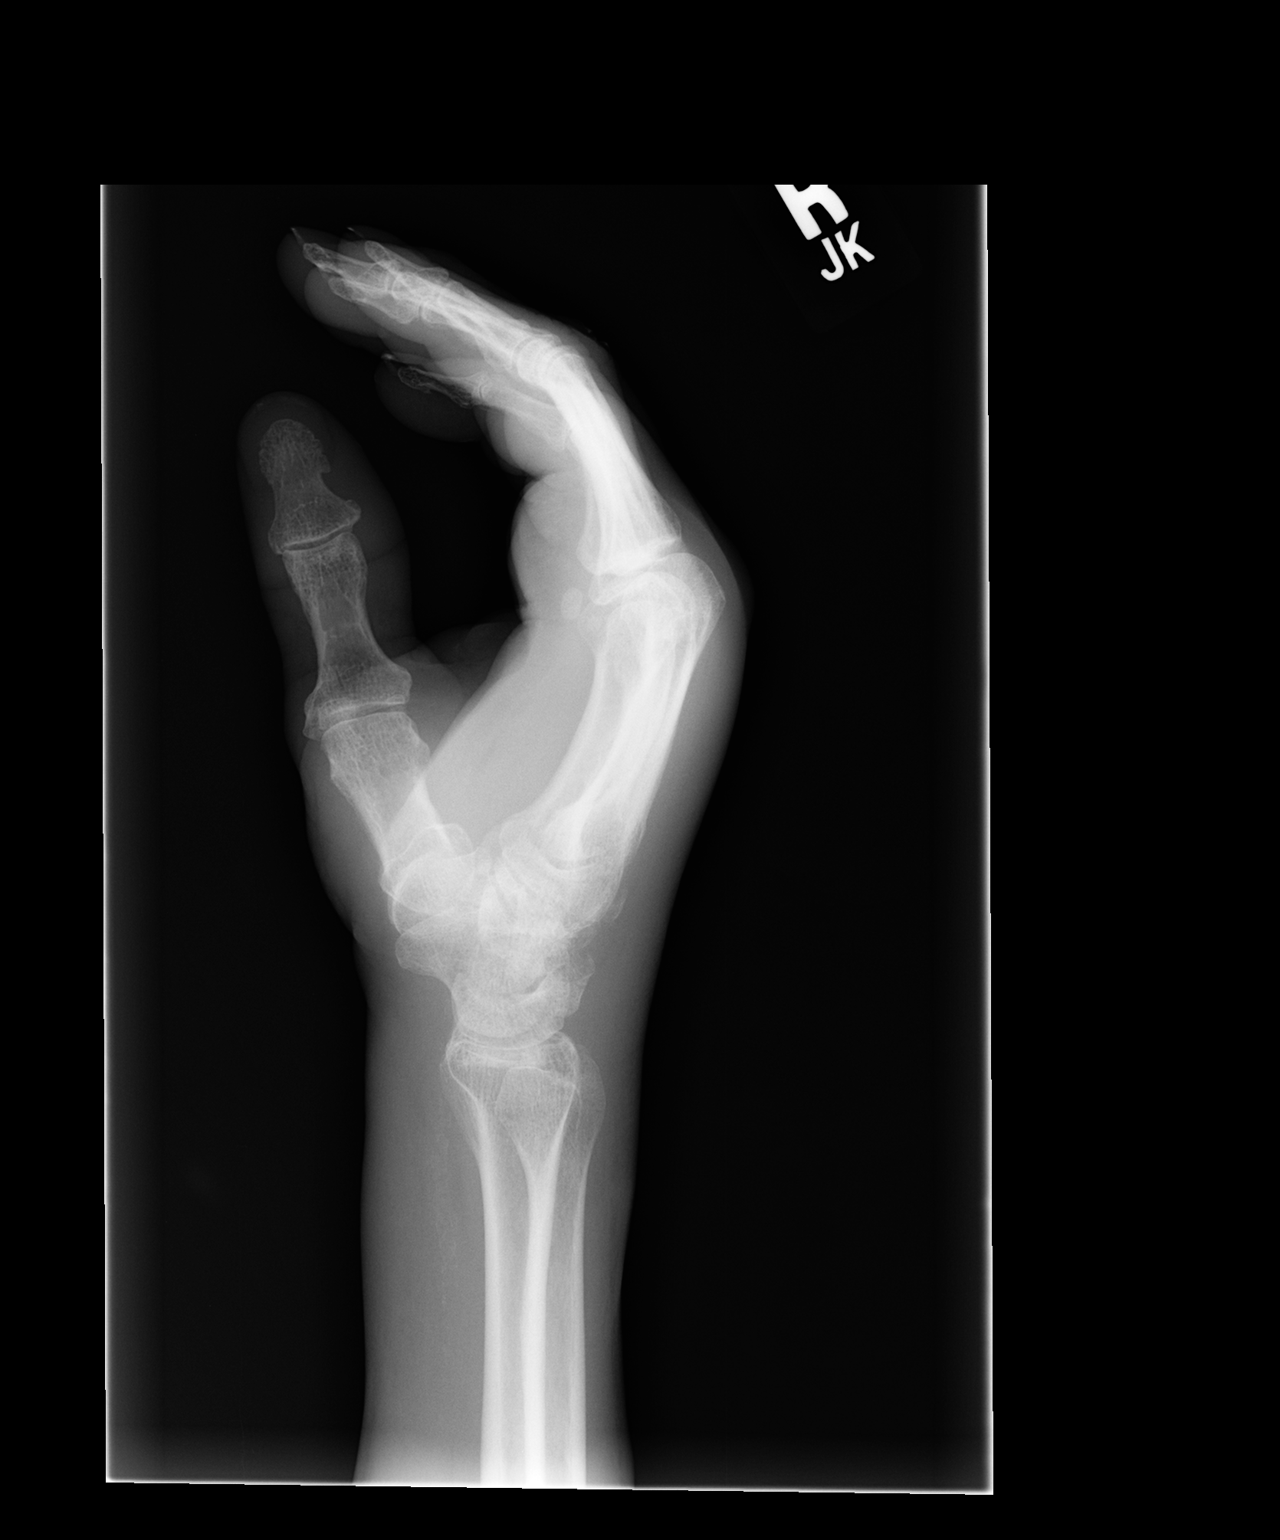

[3 of 3 positions shown; findings below may reference images not displayed]

FINDINGS: On the lateral view, findings are suspicious for avulsion fracture
the dorsal distal wrist joint. This could be in the carpal bone or
metacarpal bone. It is difficult to localize as it is not seen on
the other views.

No other fracture.  No arthropathy.
IMPRESSION: Findings suspicious for avulsion fracture the dorsal carpometacarpal
region.

## 2016-09-14 IMAGING — CT CT HEAD WO/W CM
1 of 4 series · 9 of 30 positions shown, 12 images · IV contrast (omnipaque)
Comparison: 03/27/2014 and earlier.

CLINICAL DATA: 53-year-old male being treated with antibiotics for
the last few months for cerebral abscesses. Right upper extremity
pain in weakness this morning. Initial encounter.

EXAM:
CT HEAD WITHOUT AND WITH CONTRAST
TECHNIQUE: Contiguous axial images were obtained from the base of the skull
through the vertex without and with intravenous contrast
CONTRAST:  80mL OMNIPAQUE IOHEXOL 300 MG/ML  SOLN

[Series 3: head wo 2.0 h70h · axial · 0.43mm/px · z∈[+1236,+1360]mm · 9 of 78 slices shown, 12 images]
[im 8/78  brain]
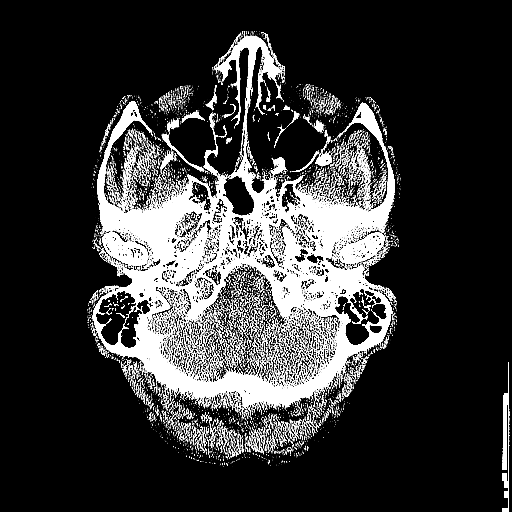
[im 8/78  bone]
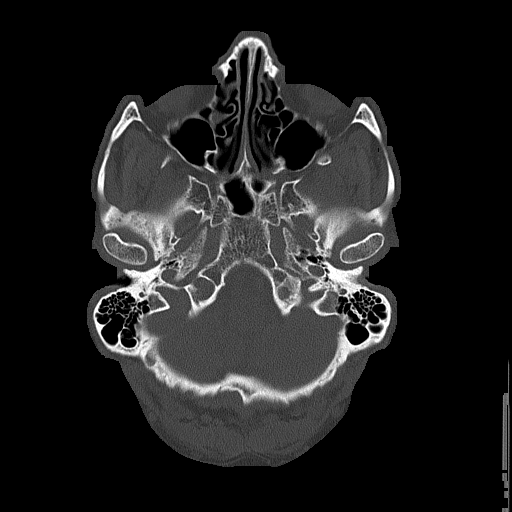
[im 16/78  brain]
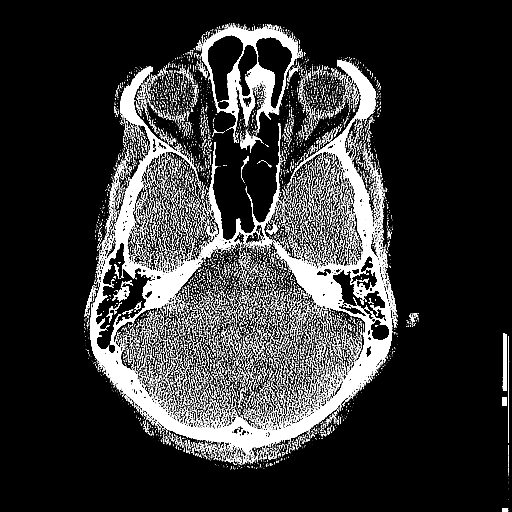
[im 24/78  brain]
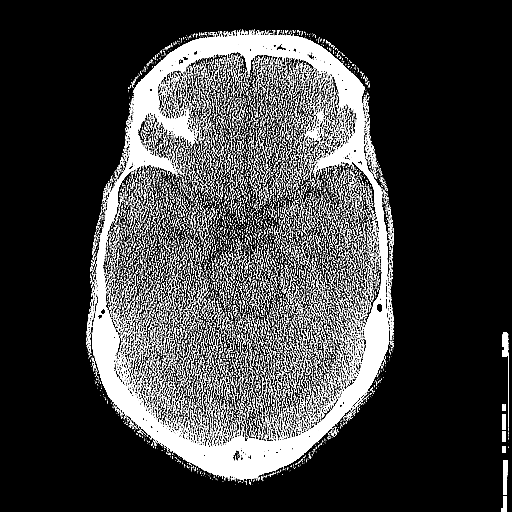
[im 31/78  brain]
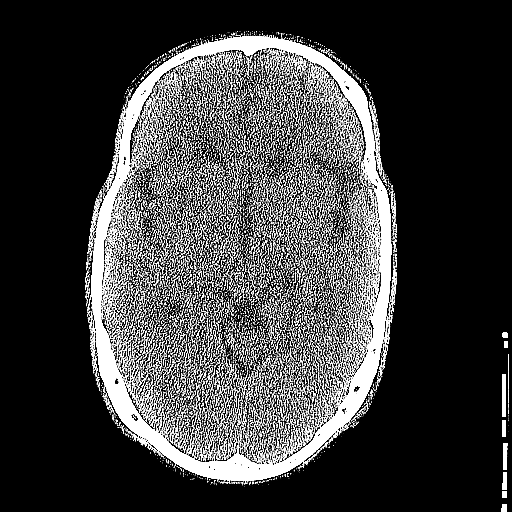
[im 39/78  brain]
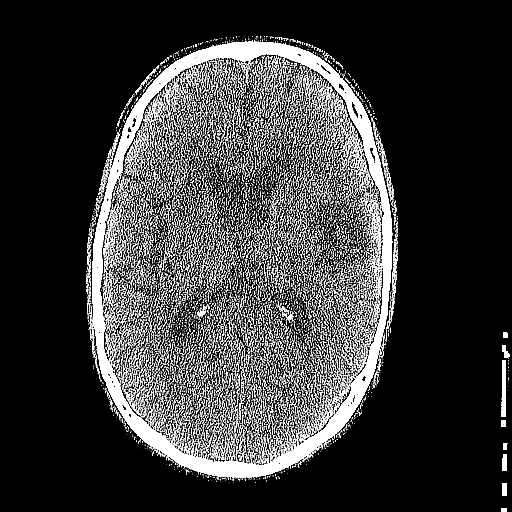
[im 39/78  bone]
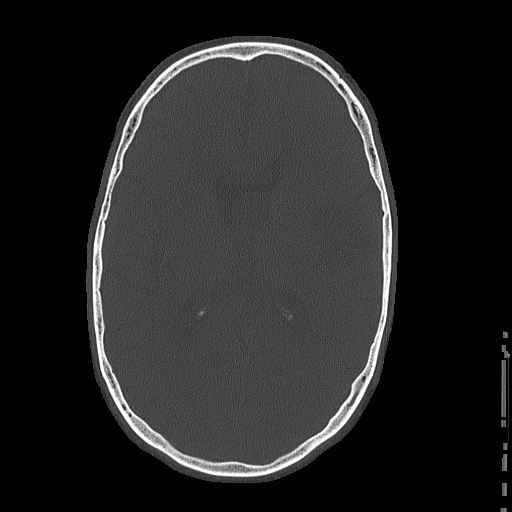
[im 47/78  brain]
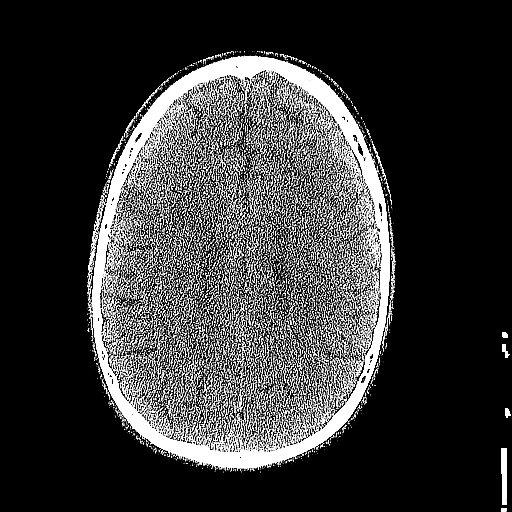
[im 54/78  brain]
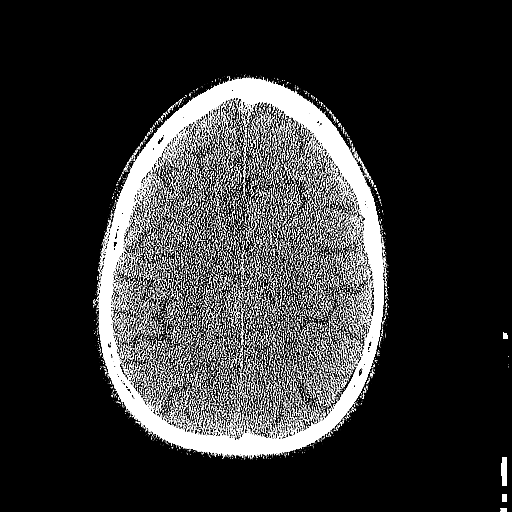
[im 62/78  brain]
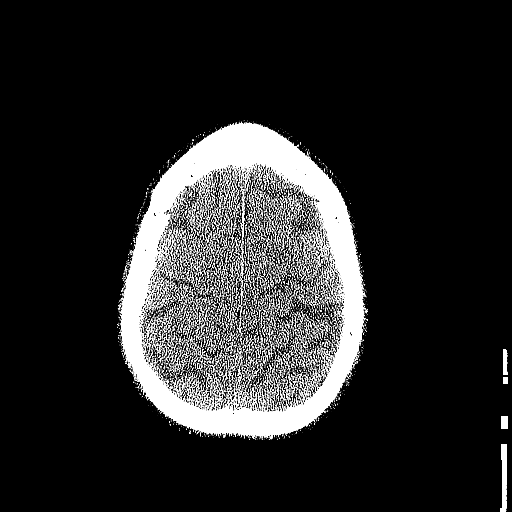
[im 70/78  brain]
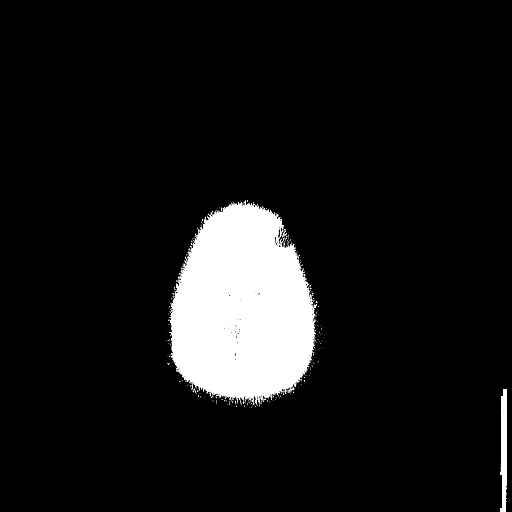
[im 70/78  bone]
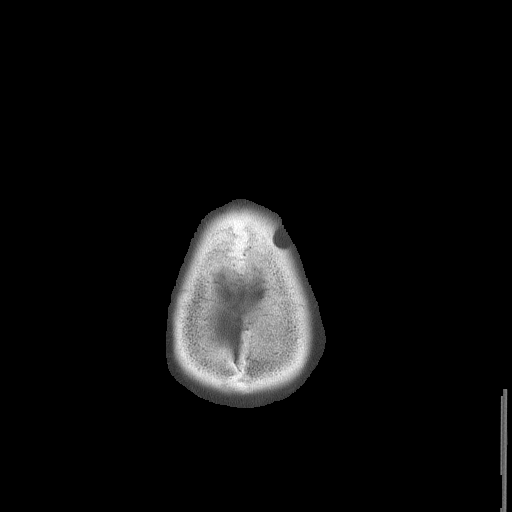

[9 of 30 positions shown; findings below may reference images not displayed]

FINDINGS: Visualized paranasal sinuses and mastoids are clear. Stable
visualized osseous structures. No acute orbit or scalp soft tissue
finding. Calcified atherosclerosis at the skull base.

Stable cerebral volume. No ventriculomegaly. No midline shift.
Basilar cisterns are patent.

Small hyperdense lesion in the right corona radiata shows mild
regression since [REDACTED] mm diameter versus 12 mm at that
time. The margins are less distinct. Surrounding hypodensity/edema
has also regressed. No significant regional mass effect. Following
contrast there is still residual enhancement of this lesion, but
smaller -now 6 mm versus 9 mm previously.

The 2 additional treated lesions also still enhance but have
decreased in size and are much less conspicuous on precontrast
studies.

Left insula and operculum encephalomalacia again noted. No evidence
of cortically based acute infarction identified. No acute
intracranial hemorrhage identified. No new abnormal enhancement.
Major vascular structures are enhancing.
IMPRESSION: 1. Mild further regression of 3 treated cerebral abscesses since
[DATE] are still enhancing. No significant intracranial mass
effect.
2. No new intracranial abnormality.

## 2016-09-29 ENCOUNTER — Telehealth (HOSPITAL_COMMUNITY): Payer: Self-pay

## 2016-10-16 IMAGING — CR DG RIBS W/ CHEST 3+V*R*
4 series · 4 of 4 positions shown · non-contrast
Comparison: Plain film 03/18/2014, 02/14/2014

CLINICAL DATA: 53-year-old male with chest trauma and right-sided
rib pain.

EXAM:
RIGHT RIBS AND CHEST - 3+ VIEW

[chest pa]
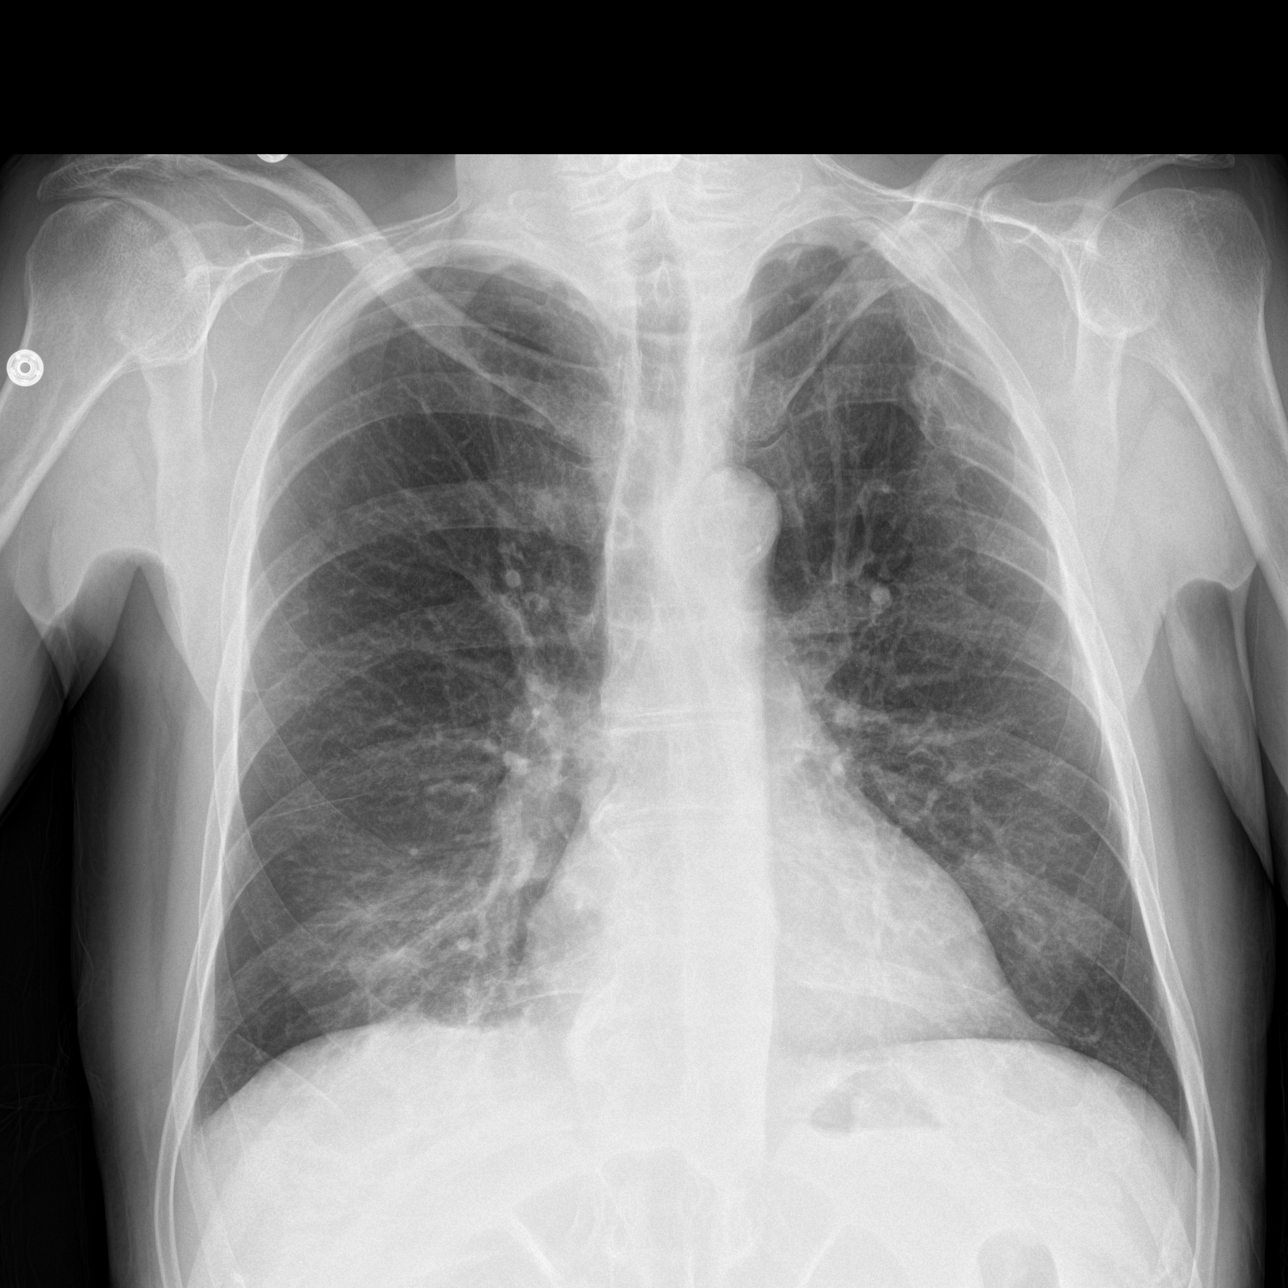

[rib pa]
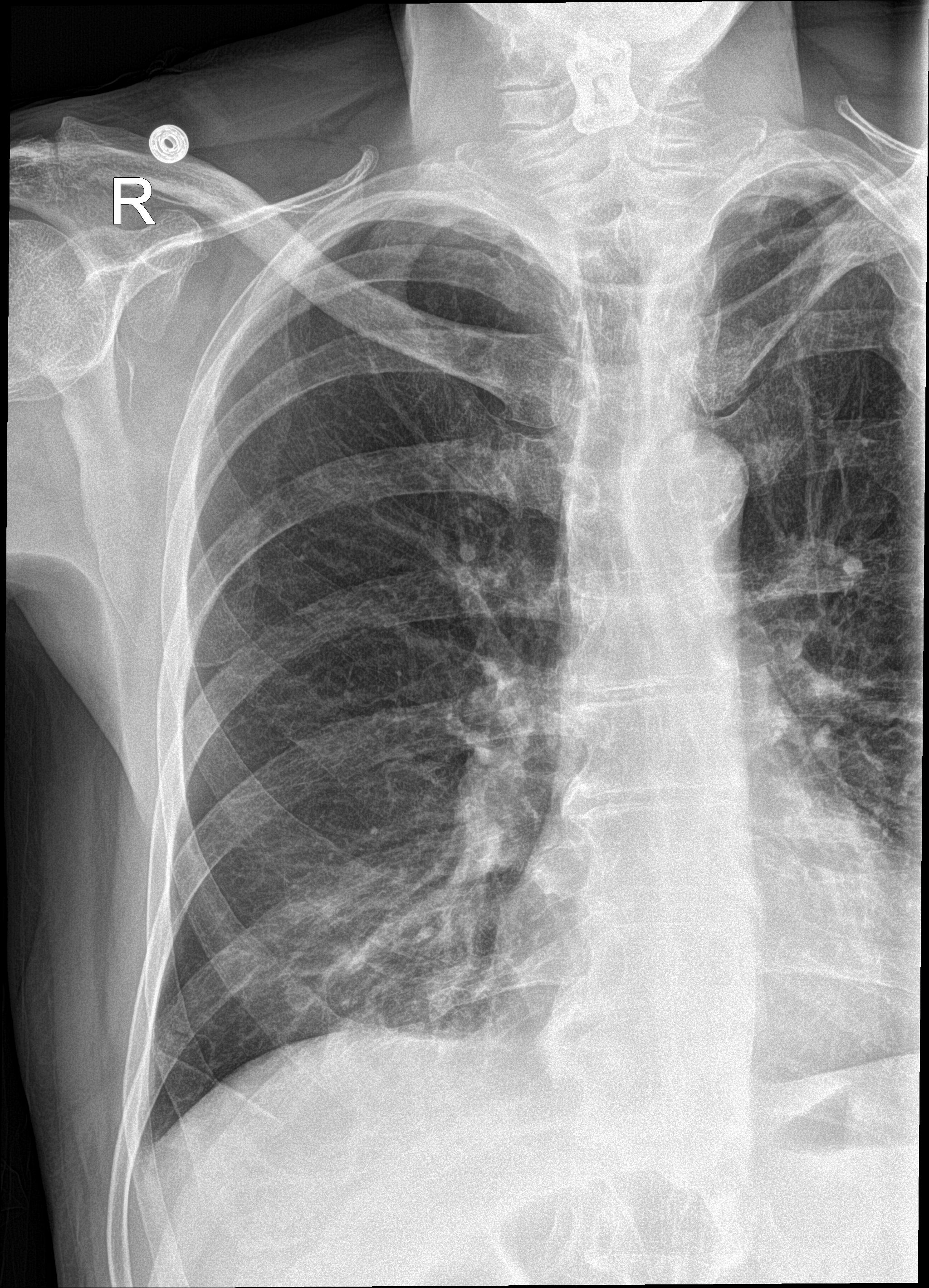

[rib pa obl (1 of 2)]
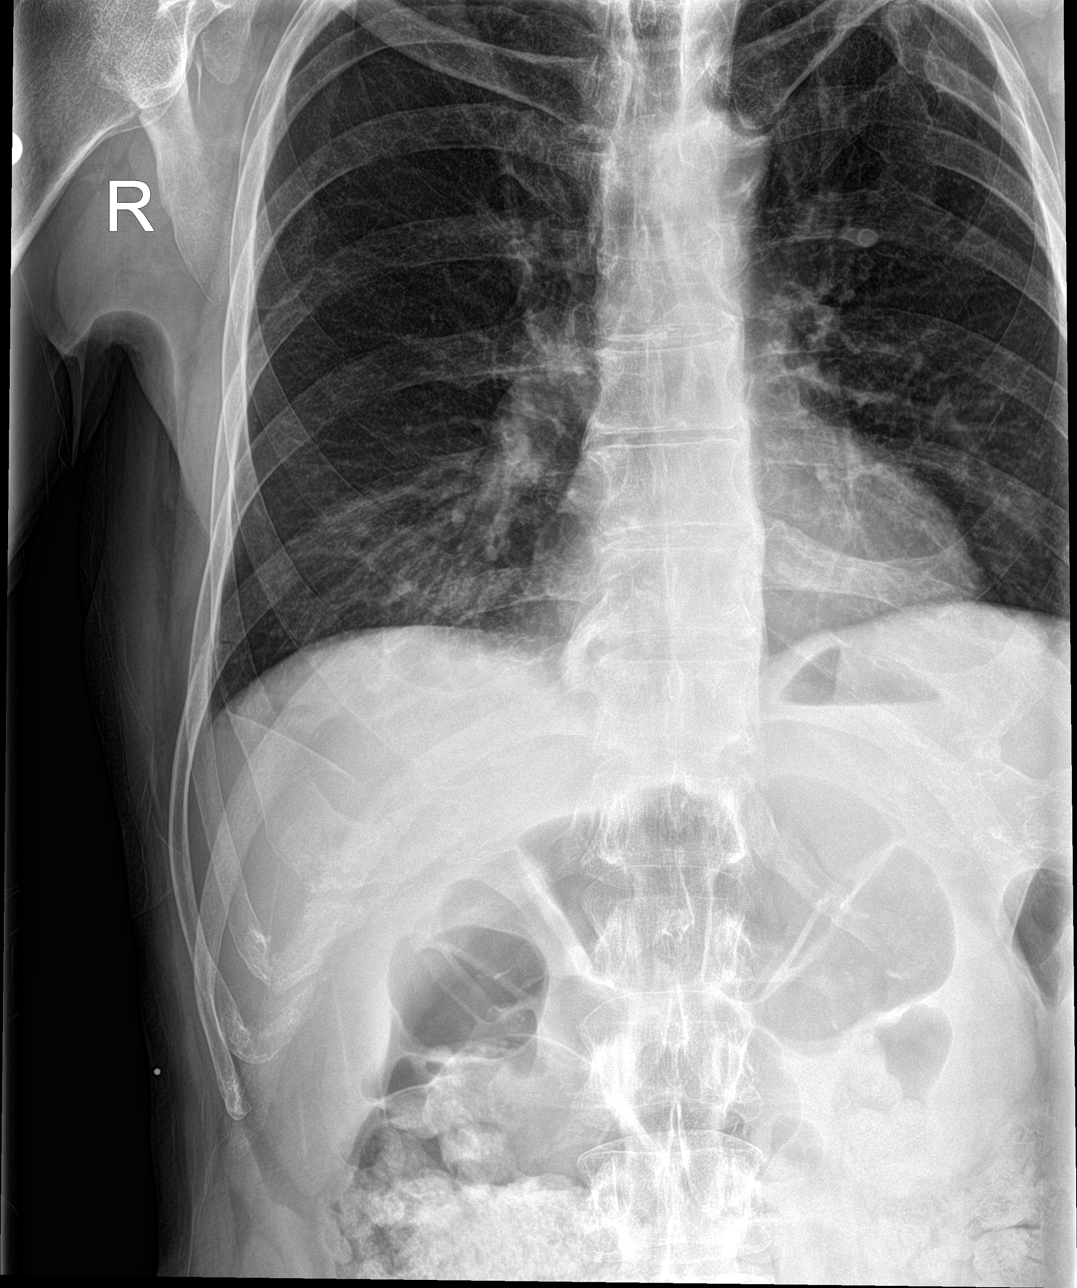

[rib pa obl (2 of 2)]
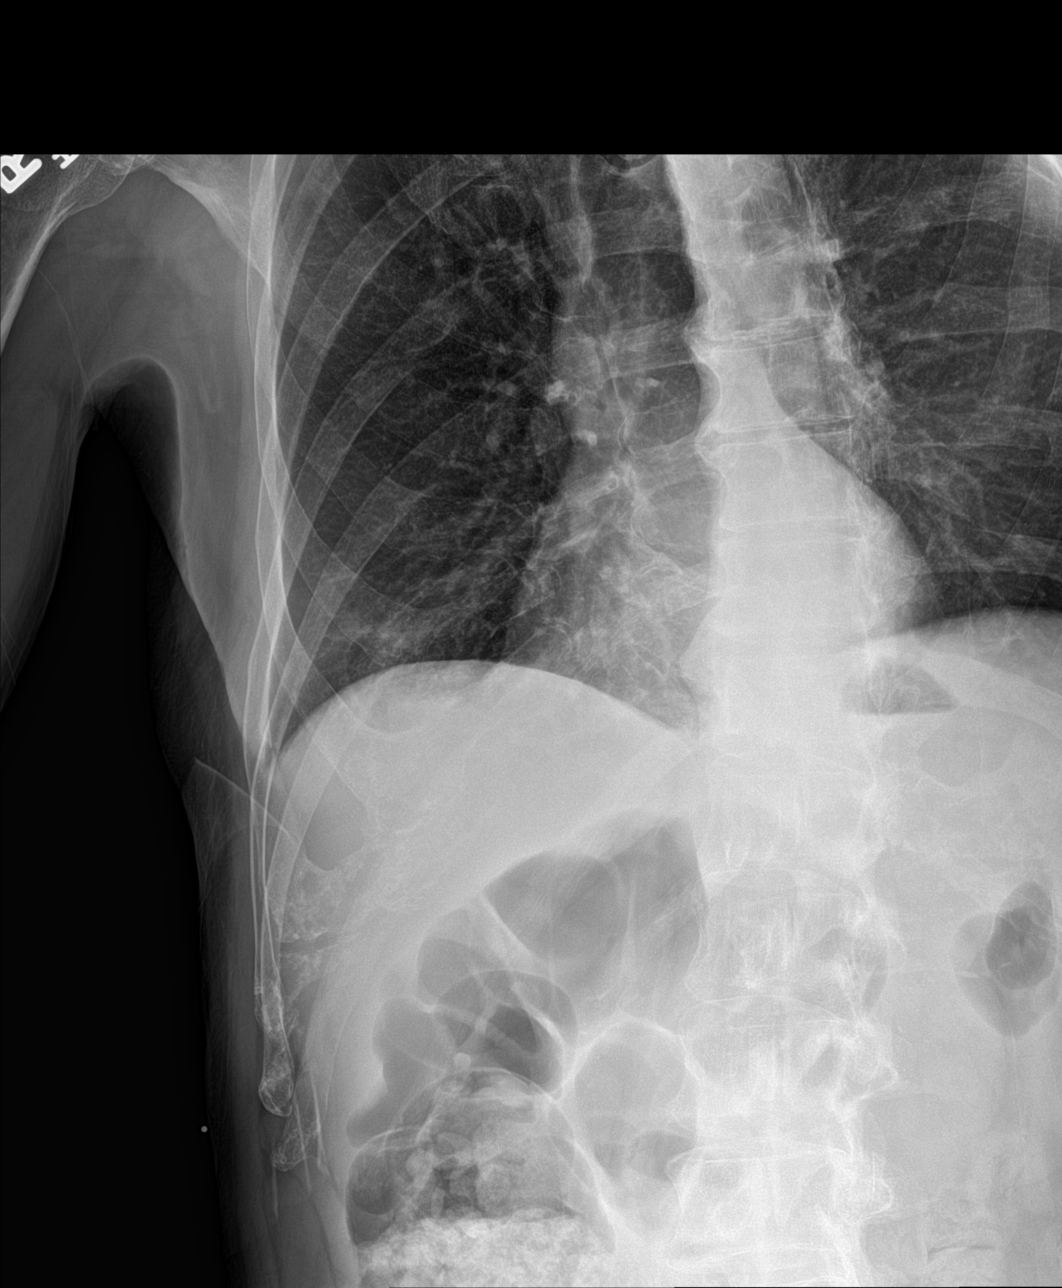

[4 of 4 positions shown; findings below may reference images not displayed]

FINDINGS: Cardiomediastinal silhouette is unchanged.

Atherosclerotic calcifications of the aortic arch.

Interstitial opacity at the medial right base, similar to comparison
plain films with no superimposed confluent airspace disease.

Posttraumatic deformity of the left upper chest wall, similar to
comparison studies.

New nondisplaced fractures of the right-sided seventh and eighth
ribs at the rib angles.

No pneumothorax.

Surgical changes of the cervical region.
IMPRESSION: Nondisplaced fracture at the rib angle of the right seventh and
eighth ribs.

Similar appearance of interstitial opacity at the medial right base
in this patient with a history of cavitary pneumonia. These are
favored to represent post infectious/inflammatory changes, though
superimposed active infection cannot be excluded.

## 2016-10-16 IMAGING — CT CT ABD-PELV W/ CM
2 of 5 series · 11 of 46 positions shown, 12 images · IV contrast (Iodine)
Comparison: 01/02/2014

CLINICAL DATA: Recent fall with right flank pain, initial encounter

EXAM:
CT ABDOMEN AND PELVIS WITH CONTRAST
TECHNIQUE: Multidetector CT imaging of the abdomen and pelvis was performed
using the standard protocol following bolus administration of
intravenous contrast.
CONTRAST:  100mL OMNIPAQUE IOHEXOL 300 MG/ML  SOLN

[Series 201: routine, idose (2) · axial · 0.78mm/px · z∈[-20,+380]mm · 8 of 100 slices shown, 9 images]
[im 10/100  soft-tissue]
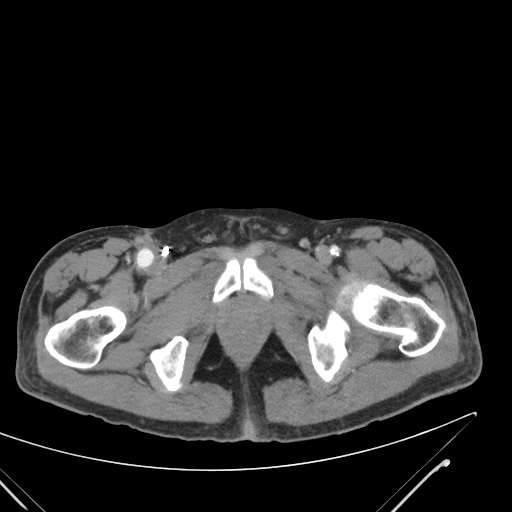
[im 10/100  bone]
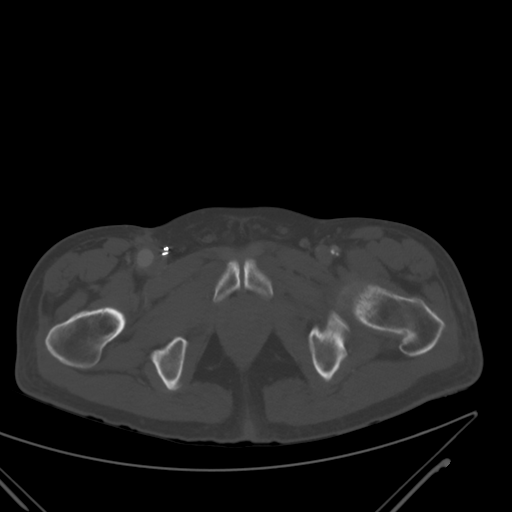
[im 20/100  soft-tissue]
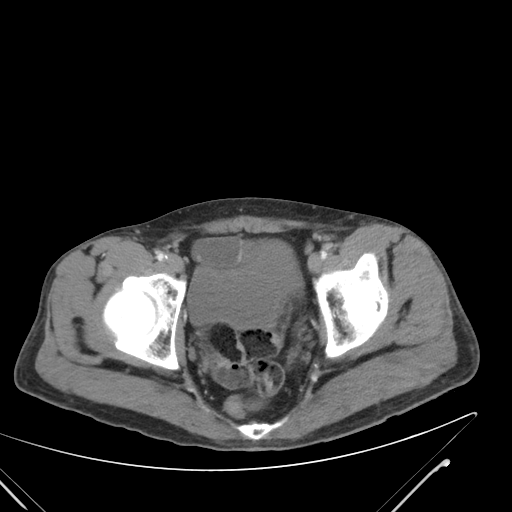
[im 30/100  soft-tissue]
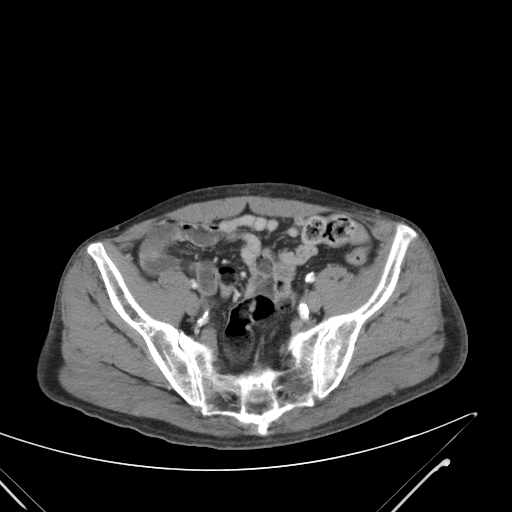
[im 45/100  soft-tissue]
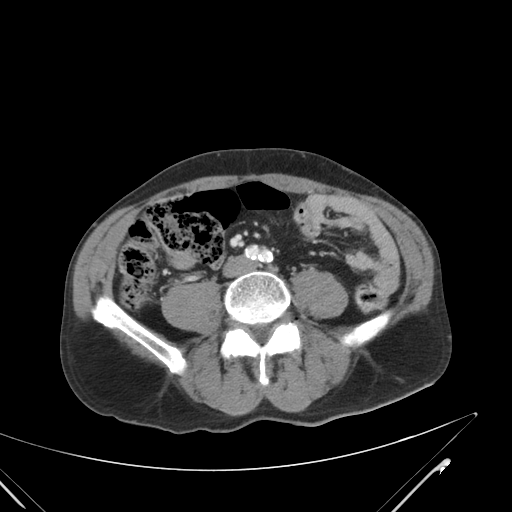
[im 55/100  soft-tissue]
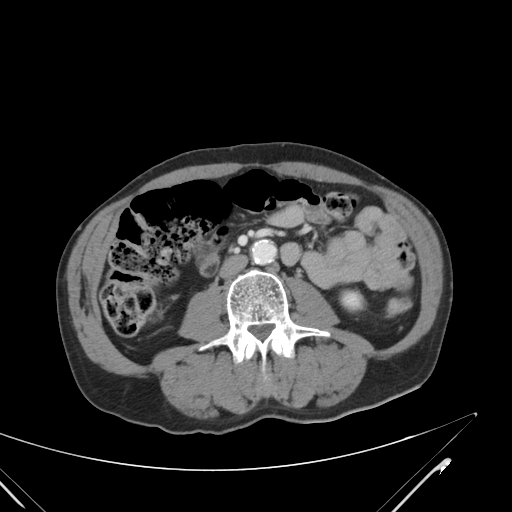
[im 70/100  soft-tissue]
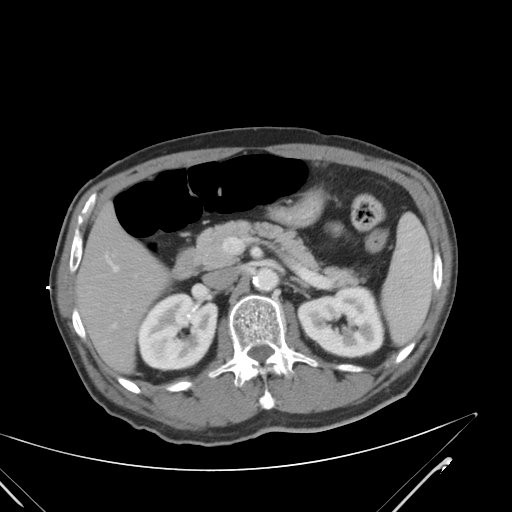
[im 80/100  soft-tissue]
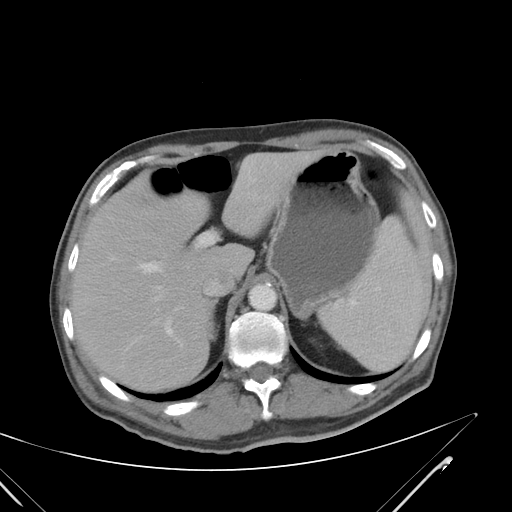
[im 90/100  soft-tissue]
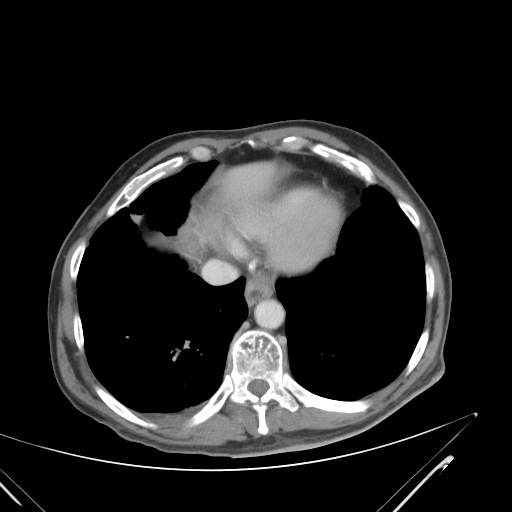

[Series 203: coronals, idose (2) · coronal · 0.45mm/px · 3 of 122 slices shown]
[im 41/122  soft-tissue]
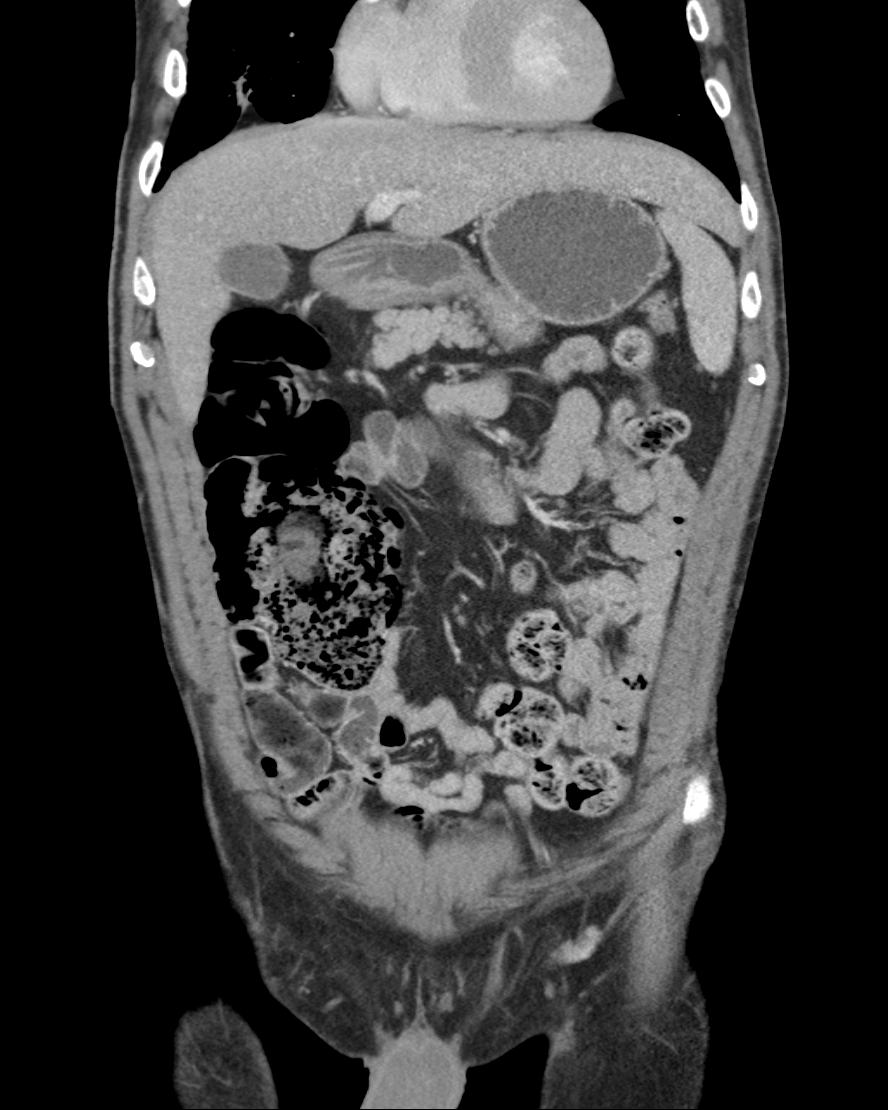
[im 54/122  soft-tissue]
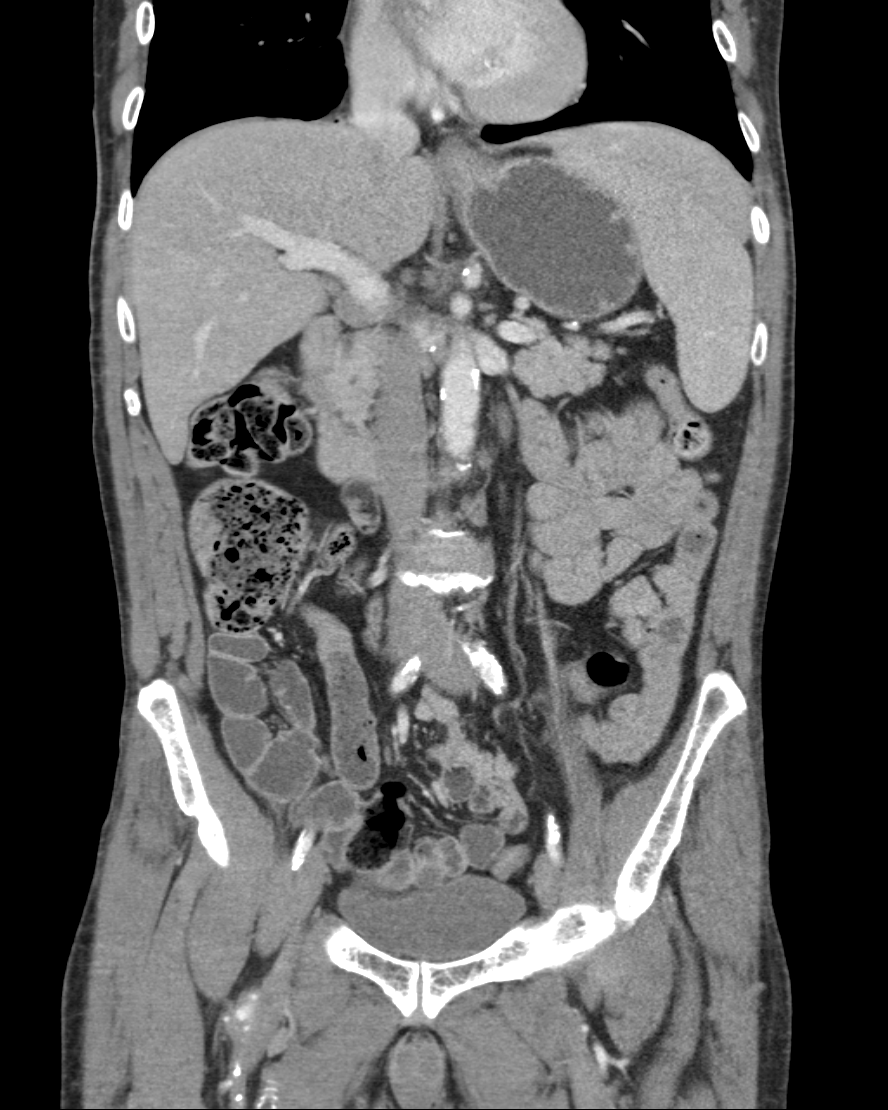
[im 68/122  soft-tissue]
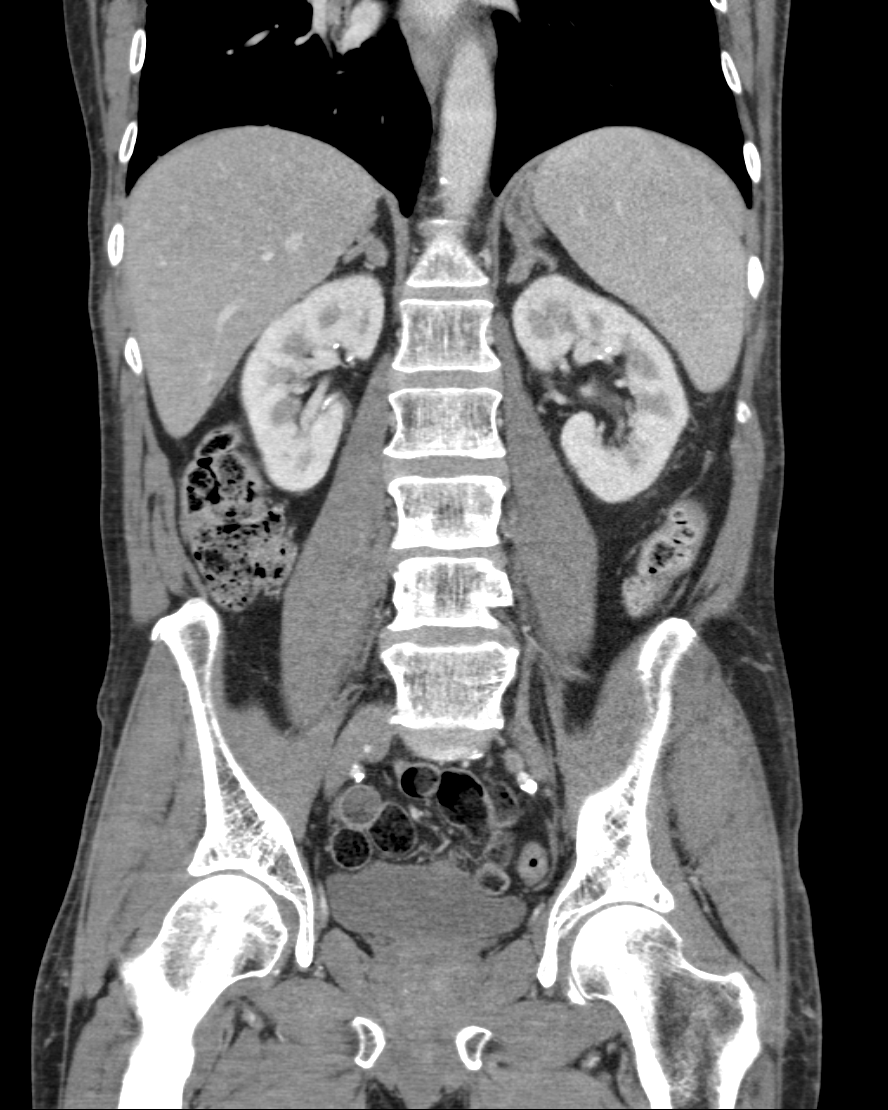

[11 of 46 positions shown; findings below may reference images not displayed]

FINDINGS: Lung bases show evidence of scarring in the right middle lobe
consistent with resolution of a previously seen cavitary lesion.
Mucous plugging is noted within the lower lobe bronchi bilaterally.

The liver, spleen, gallbladder, adrenal glands and pancreas are
within normal limits with the exception of some hypertrophy of the
adrenal glands bilaterally. Kidneys demonstrate diffuse renal
vascular calcifications bilaterally. No renal calculi or obstructive
changes are seen. Bladder is well distended without focal filling
defect.

Diffuse aortoiliac calcifications are noted without aneurysmal
dilatation. No bony abnormality is noted.
IMPRESSION: Scarring in the right middle lobe consistent with resolution of
prior cavitary lesion.

Mild mucous plugging is noted the lower lobes bilaterally.

No acute abnormality is noted.

## 2016-10-24 IMAGING — CR DG CHEST 2V
2 series · 2 of 2 positions shown · non-contrast
Comparison: Chest and ribs June 23, 2014 ; chest radiograph
February 14, 2014

CLINICAL DATA: Recent chest trauma with known rib fractures ; pain.
Current rectal bleeding.

EXAM:
CHEST  2 VIEW

[w chest pa]
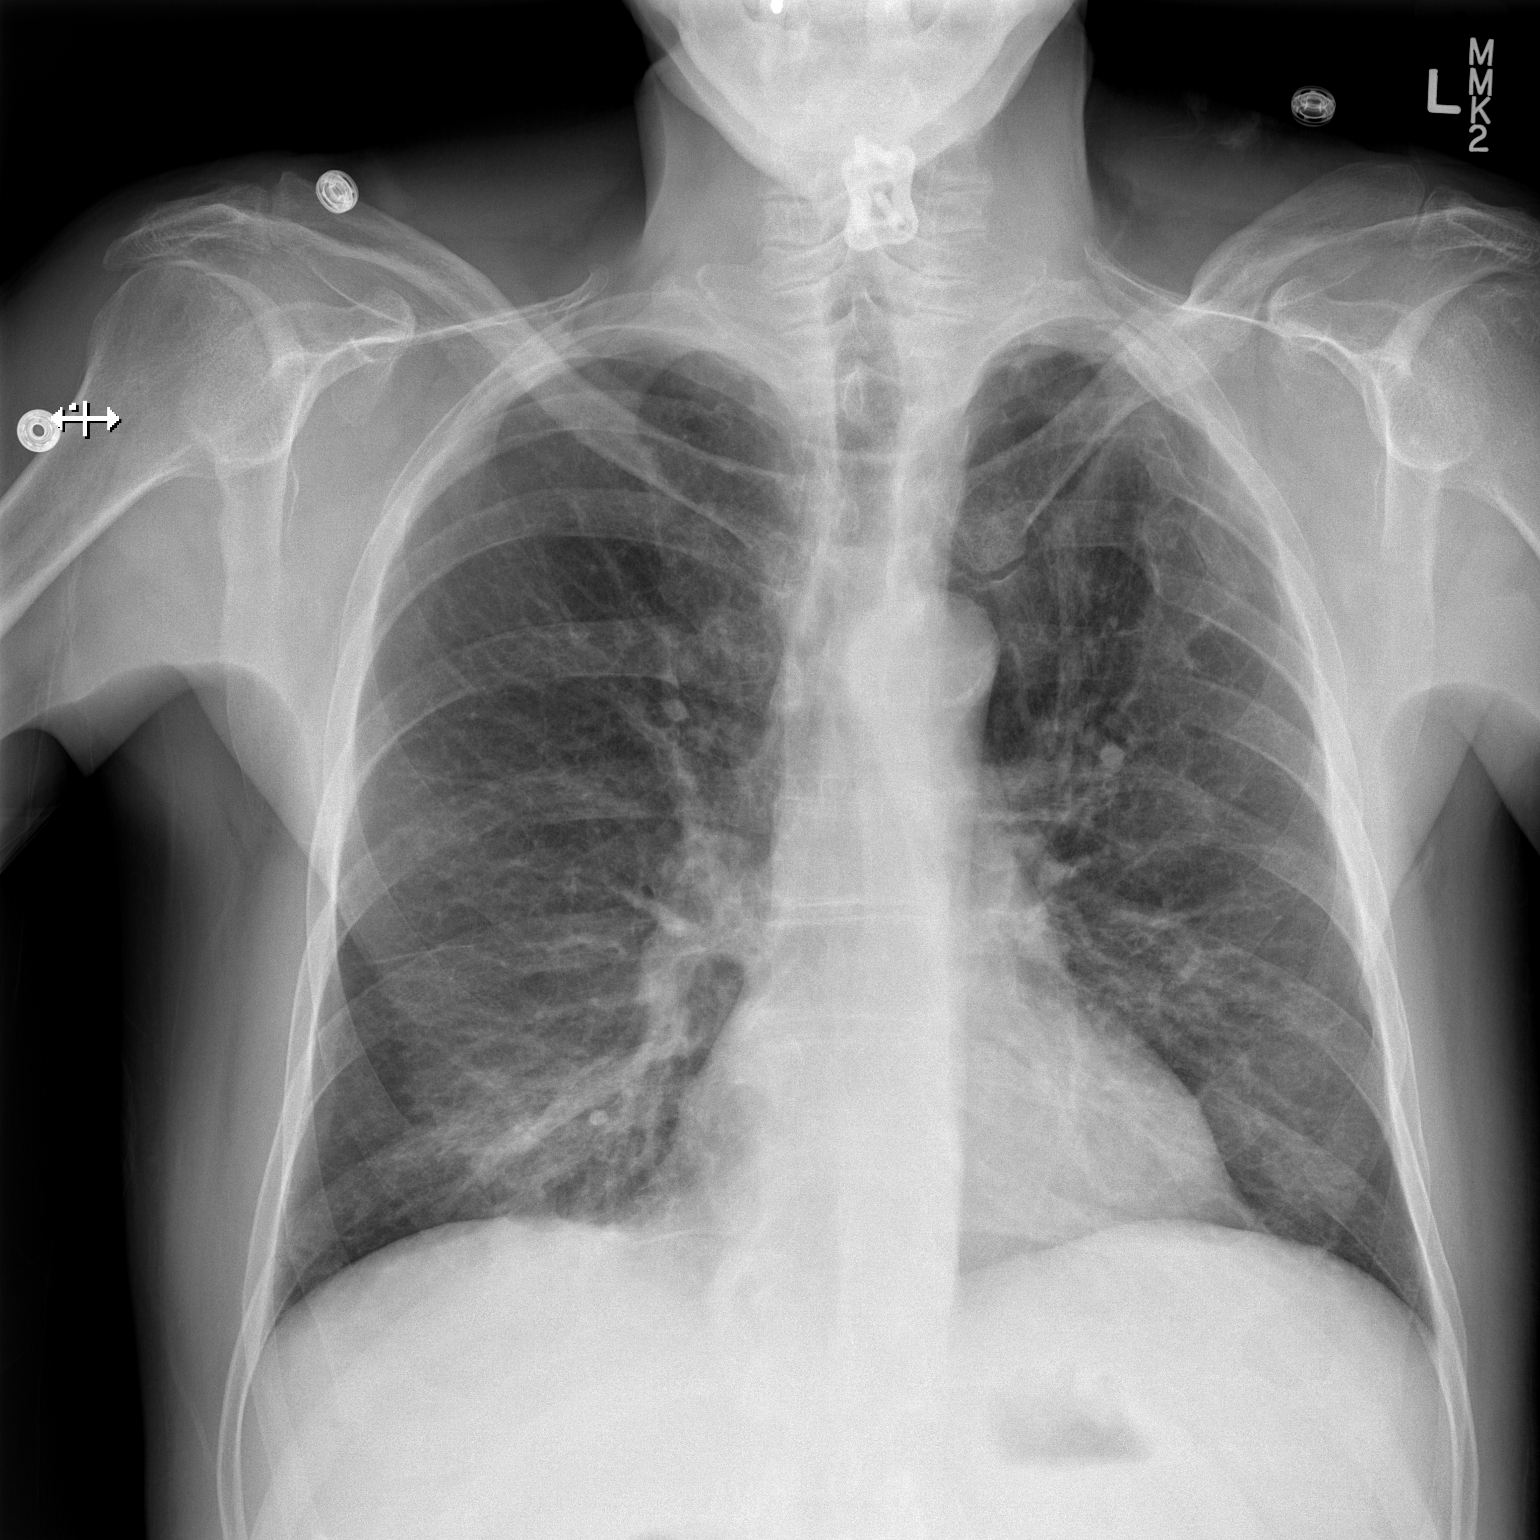

[w chest lat]
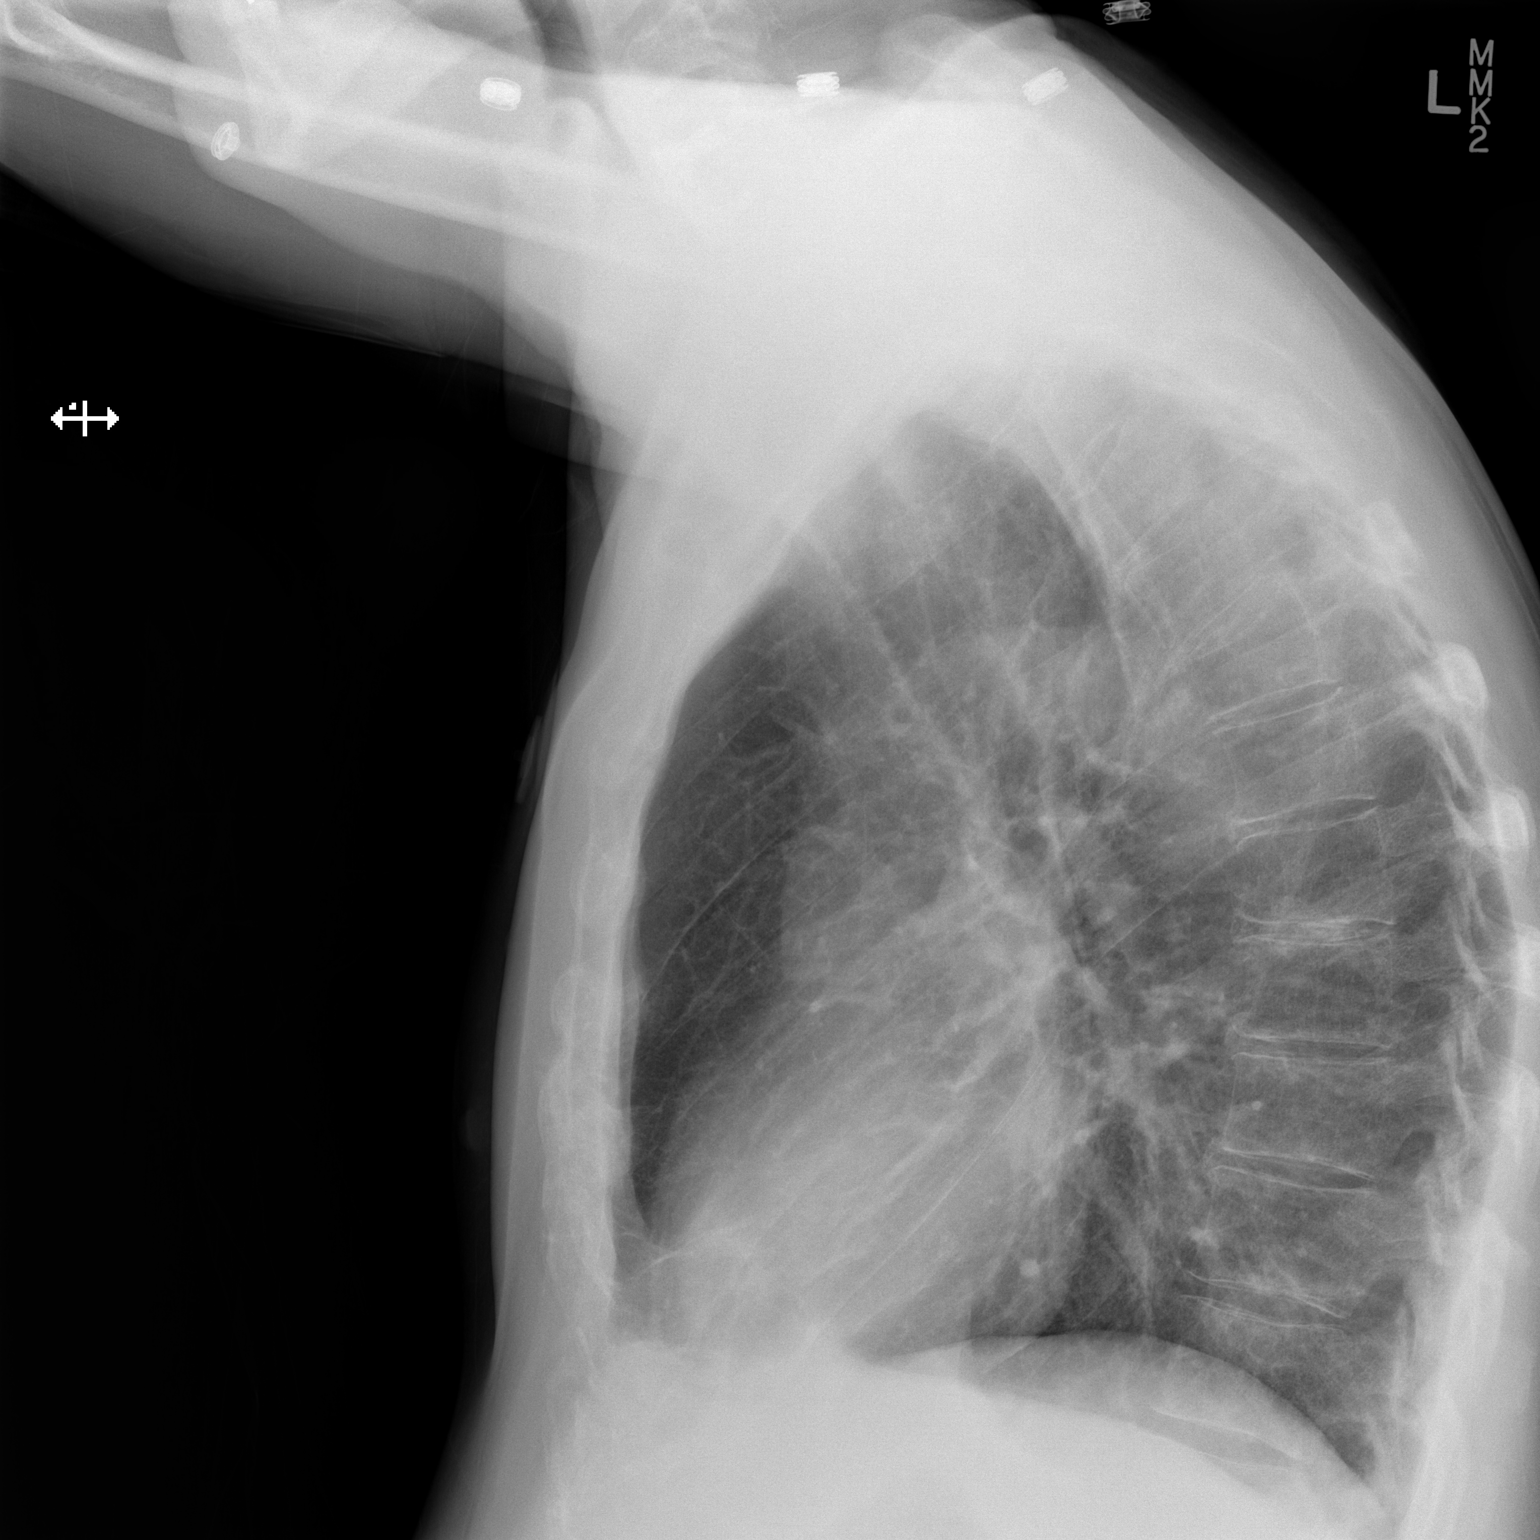

[2 of 2 positions shown; findings below may reference images not displayed]

FINDINGS: The recent fractures involving the anterior right seventh and eighth
ribs are less well seen but present. There is old rib trauma on the
left, stable. No pneumothorax or effusion. There is mild scarring in
the right base, stable. Lungs elsewhere are clear. Heart size and
pulmonary vascularity are normal. No adenopathy. There is
postoperative change in the lower cervical spine.
IMPRESSION: No recent rib fractures on the right, better seen on recent prior
study. Old rib trauma on the left. No effusion or pneumothorax
apparent. Scarring right base. No edema or consolidation. No change
in cardiac silhouette.

## 2016-11-14 IMAGING — CR DG CHEST 1V PORT
1 series · 1 of 1 positions shown · non-contrast
Comparison: 07/01/14

CLINICAL DATA: Elevated cardiac enzymes. Peripheral vascular
disease.

EXAM:
PORTABLE CHEST - 1 VIEW

[AP]
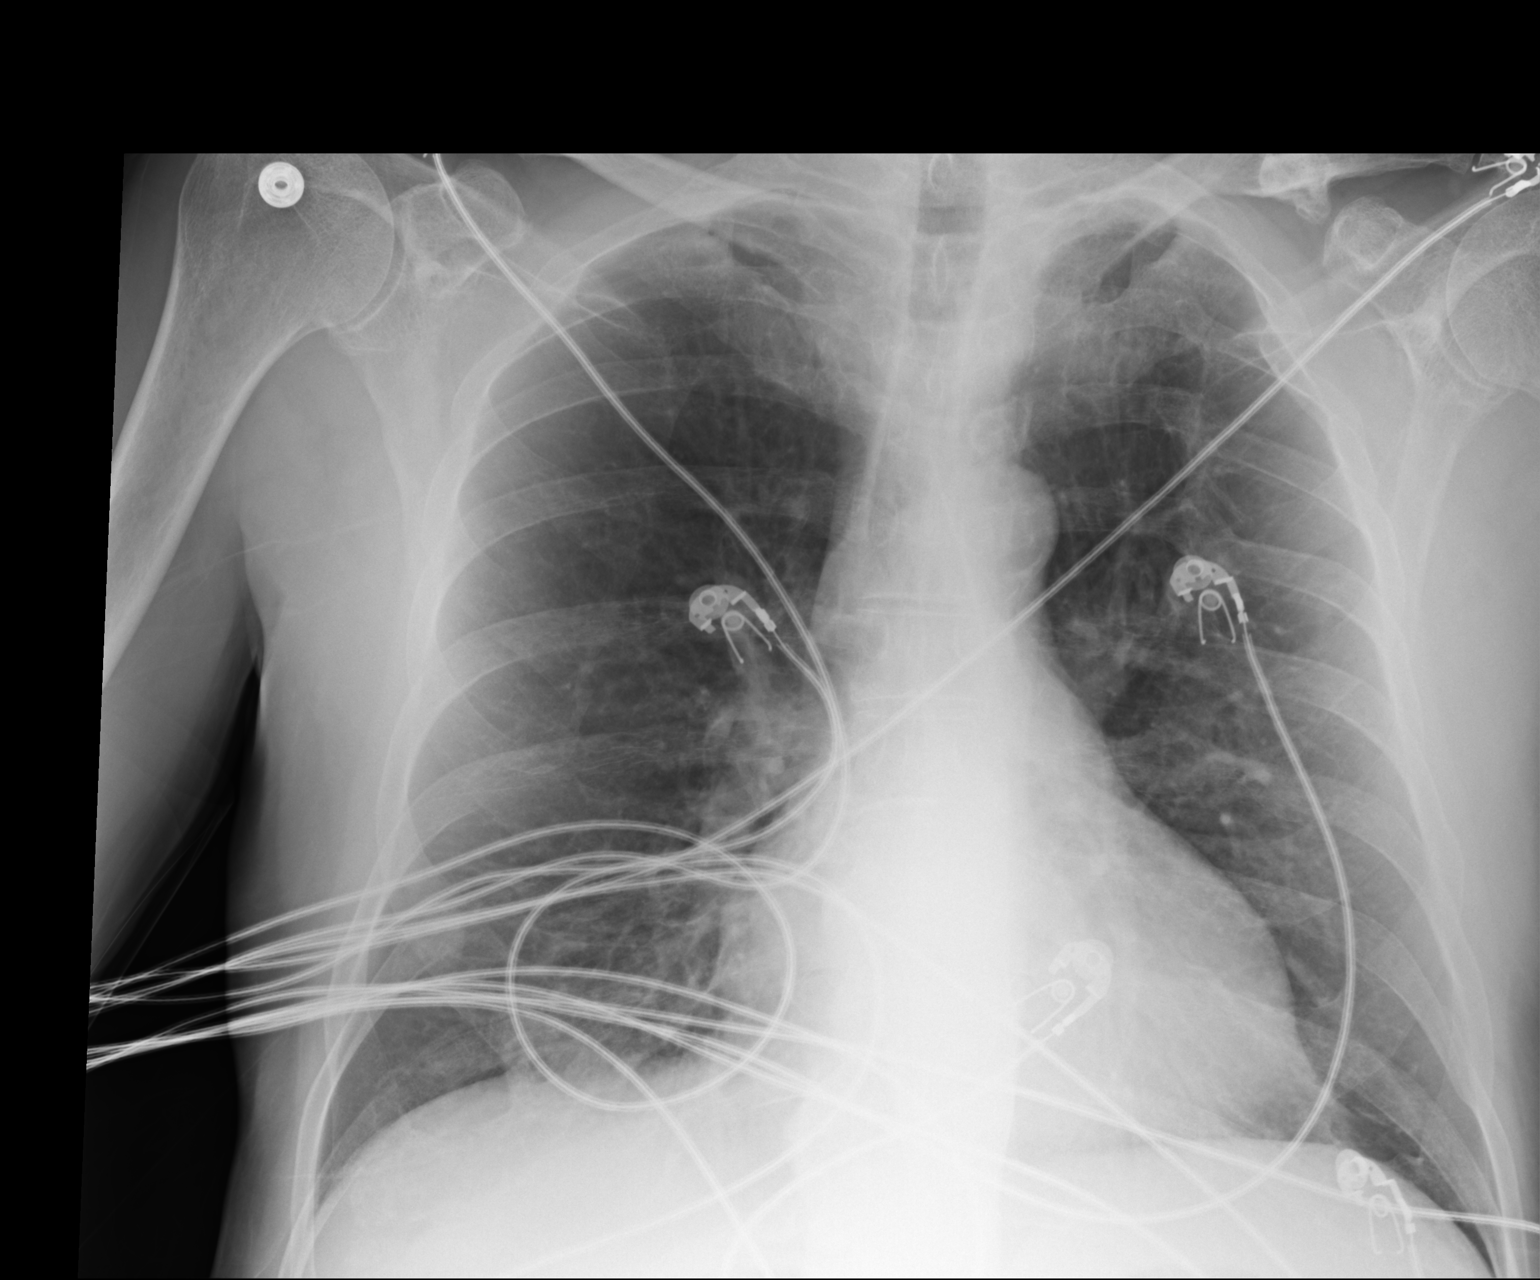

[1 of 1 positions shown; findings below may reference images not displayed]

FINDINGS: Heart size is within normal limits. Both lungs are clear. No
evidence of pneumothorax or pleural effusion. Mild hyperinflation
again noted, suspicious for COPD. Old left clavicle and rib fracture
deformities again noted.
IMPRESSION: Stable exam.  Probable COPD.  No active disease.

## 2016-11-14 IMAGING — DX DG HIP (WITH OR WITHOUT PELVIS) 2-3V*L*
3 series · 3 of 3 positions shown · non-contrast
Comparison: None.

CLINICAL DATA: Left hip pain

EXAM:
DG HIP (WITH OR WITHOUT PELVIS) 2-3V LEFT

[pelvis ap]
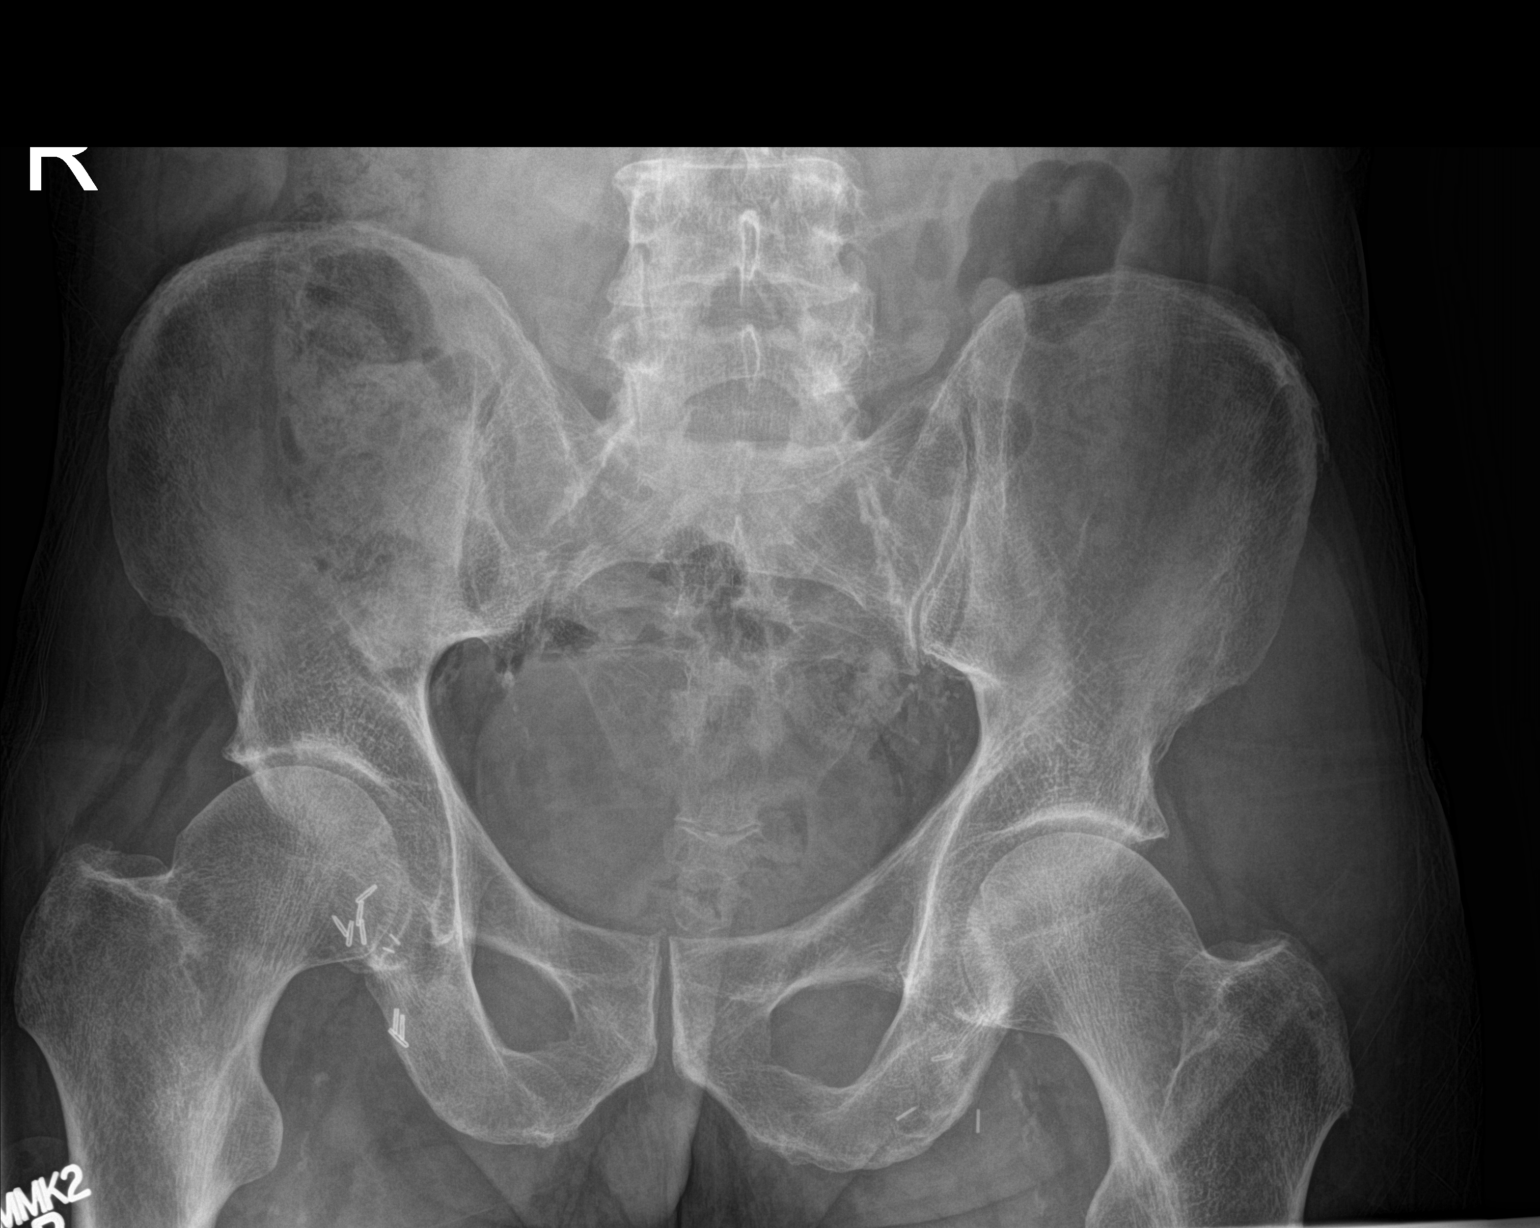

[hip ap]
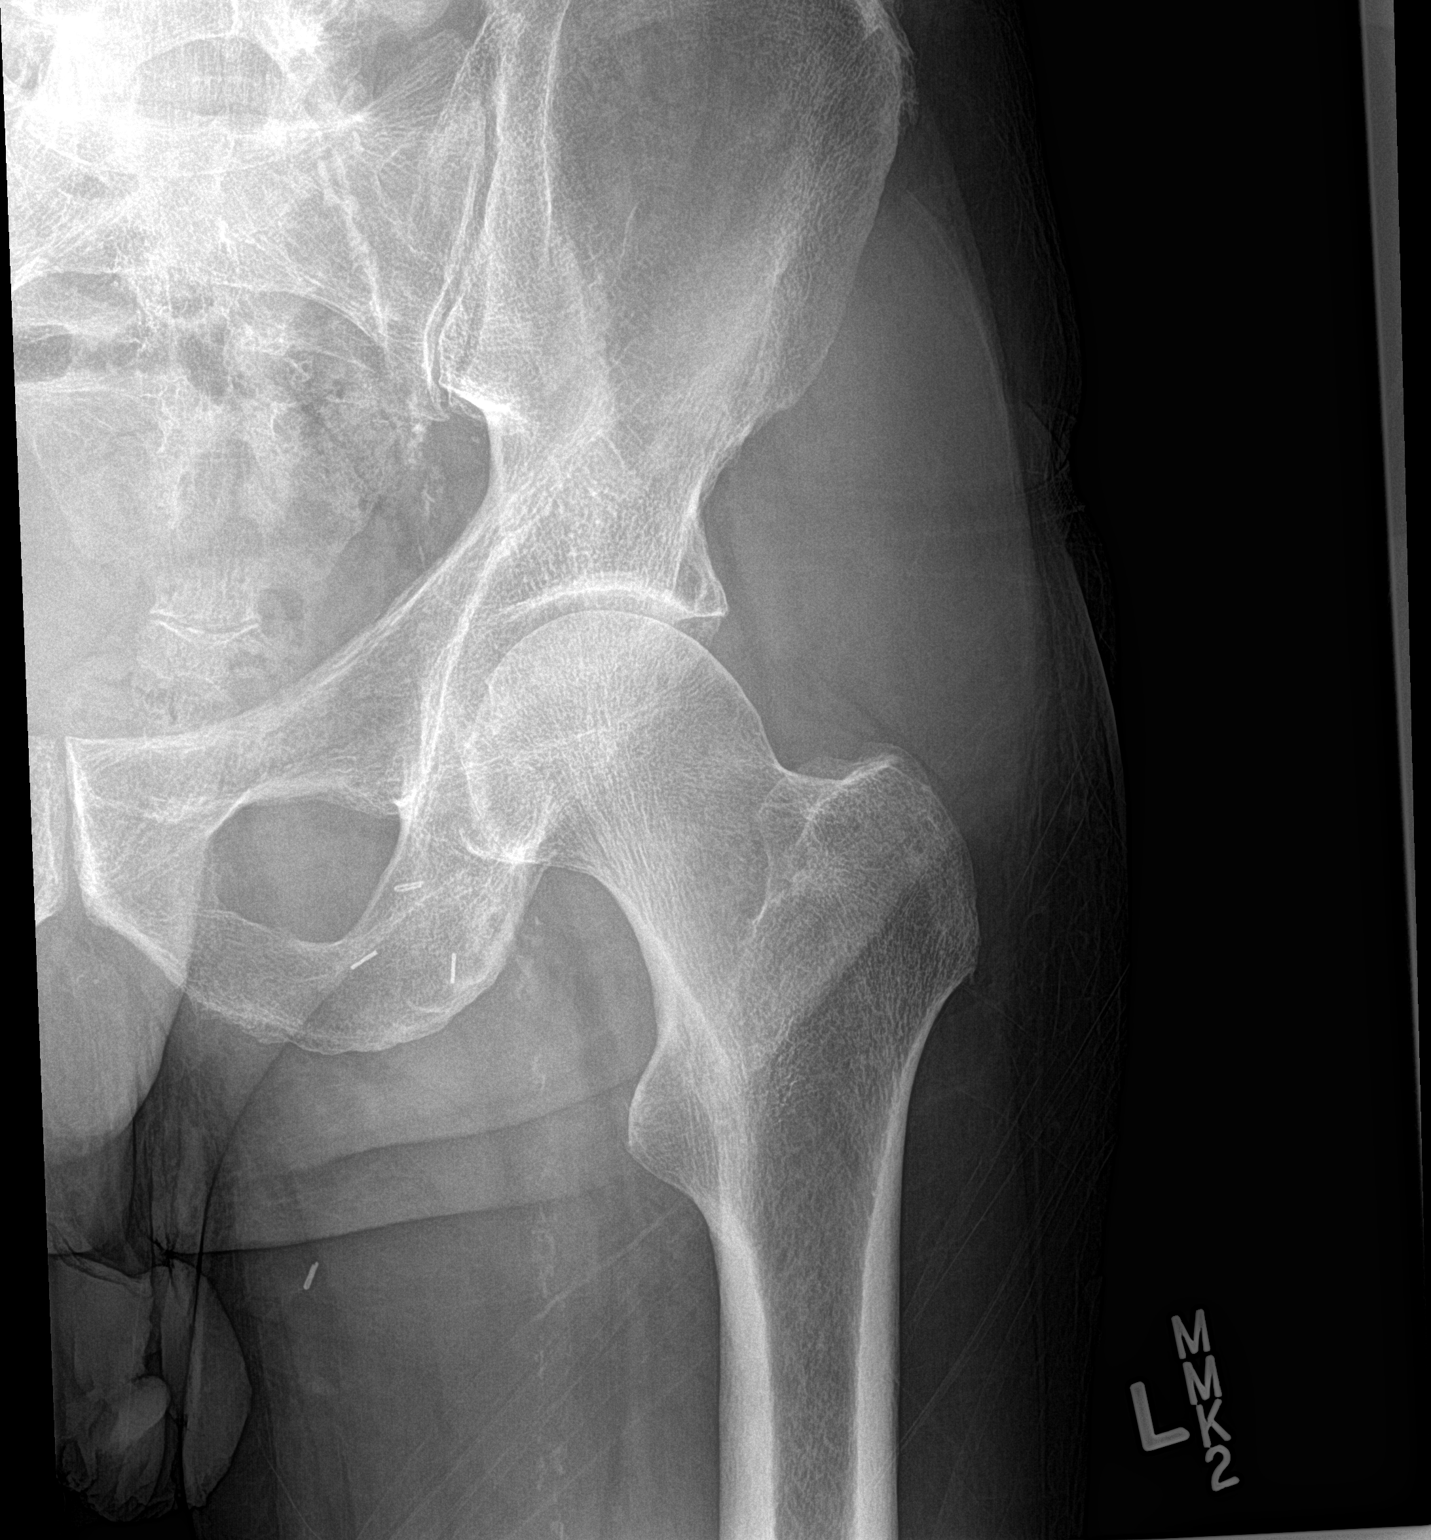

[hip lat]
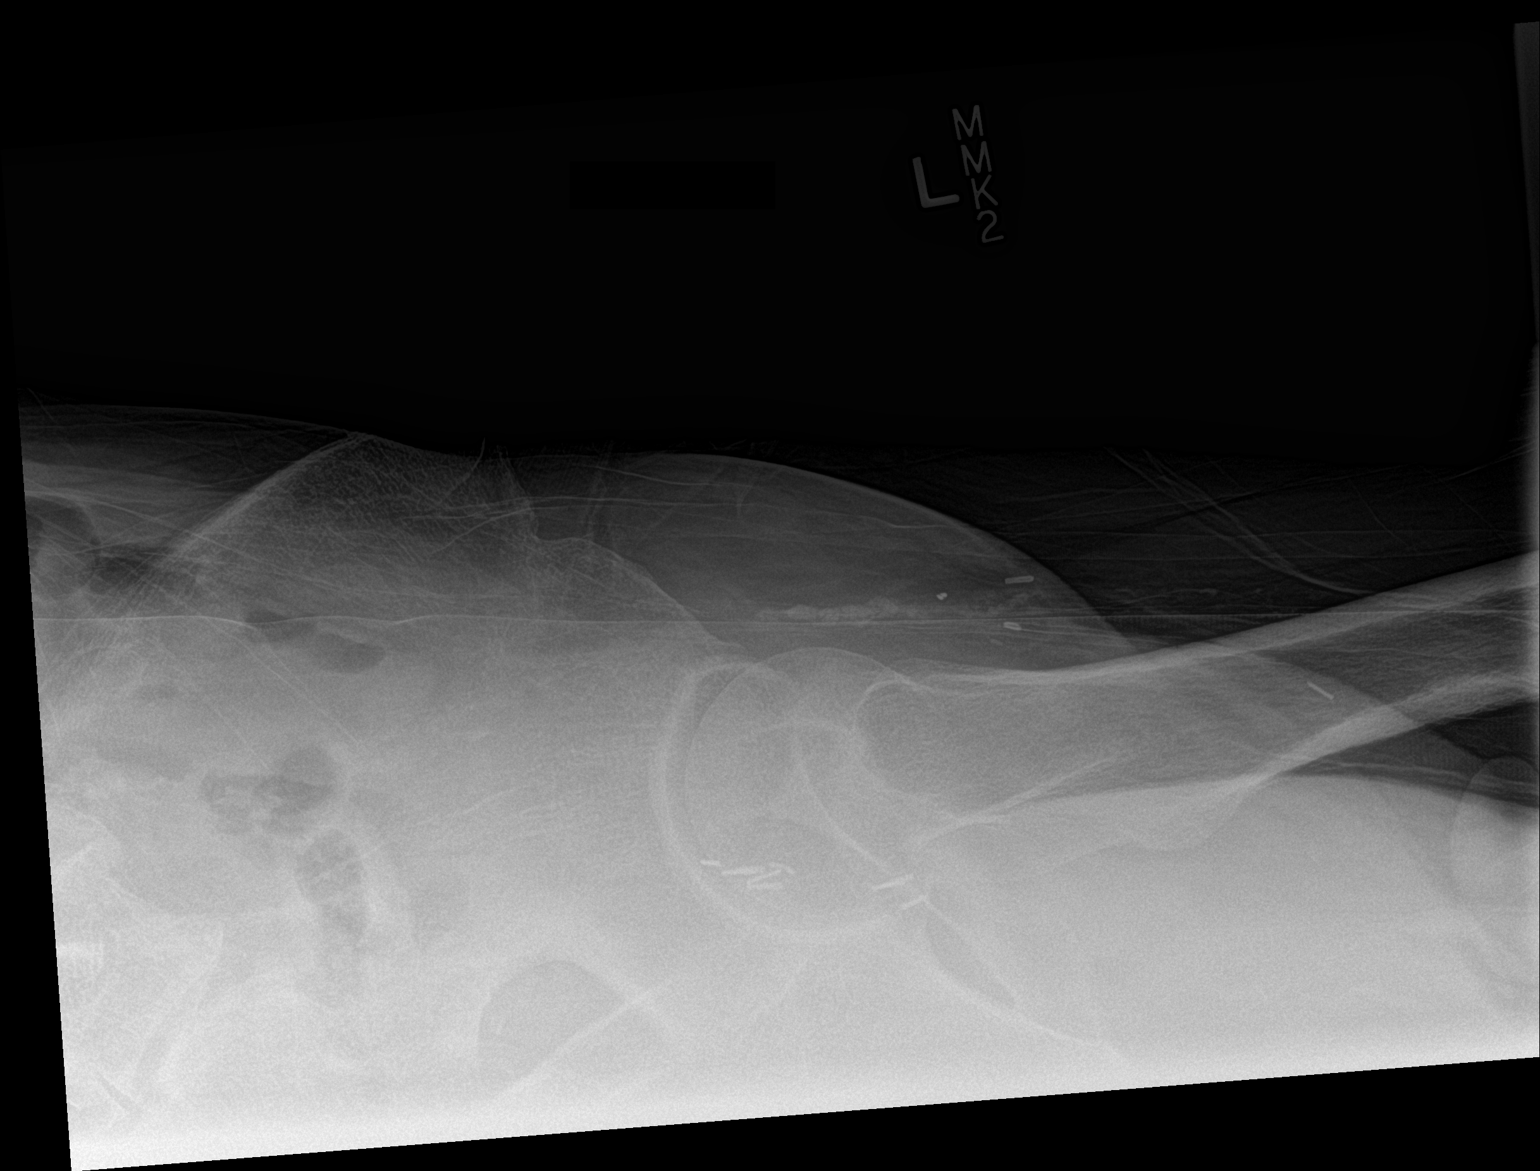

[3 of 3 positions shown; findings below may reference images not displayed]

FINDINGS: There is no evidence of hip fracture or dislocation. There is no
evidence of arthropathy or other focal bone abnormality.
IMPRESSION: Negative.

## 2016-11-14 IMAGING — CT CT HEAD W/O CM
1 series · 15 of 30 positions shown, 19 images · non-contrast
Comparison: 05/22/2014 and 03/27/2014

CLINICAL DATA: Loss of consciousness, history of brain infection

EXAM:
CT HEAD WITHOUT CONTRAST
TECHNIQUE: Contiguous axial images were obtained from the base of the skull
through the vertex without intravenous contrast.

[Series 2: head 5.0 h30s · axial · 0.44mm/px · z∈[-119,+31]mm · 15 of 34 slices shown, 19 images]
[im 2/34  brain]
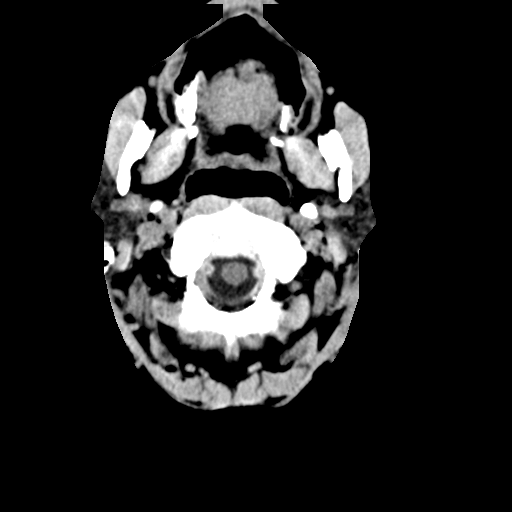
[im 2/34  bone]
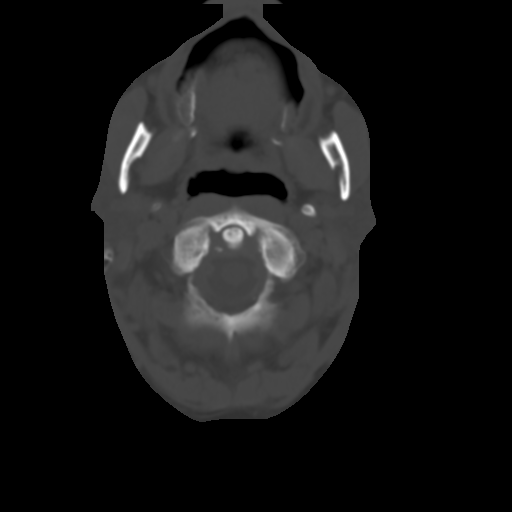
[im 4/34  brain]
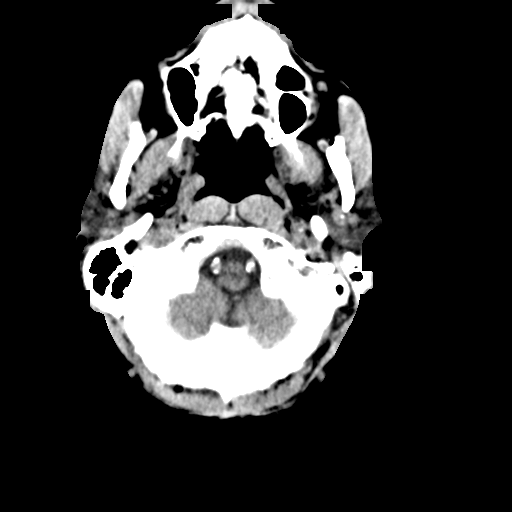
[im 6/34  brain]
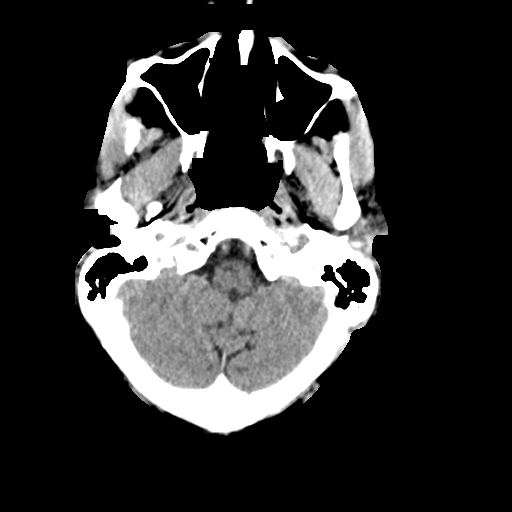
[im 8/34  brain]
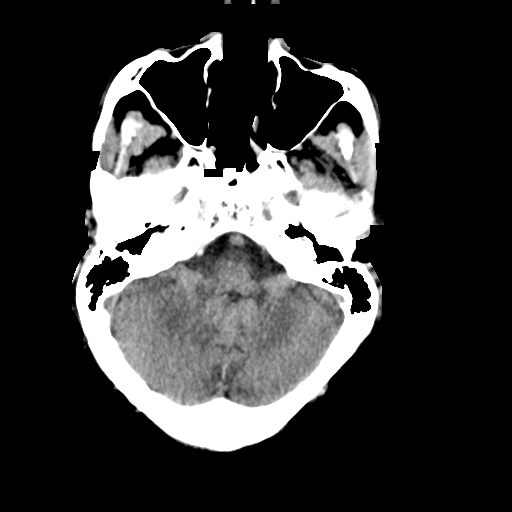
[im 11/34  brain]
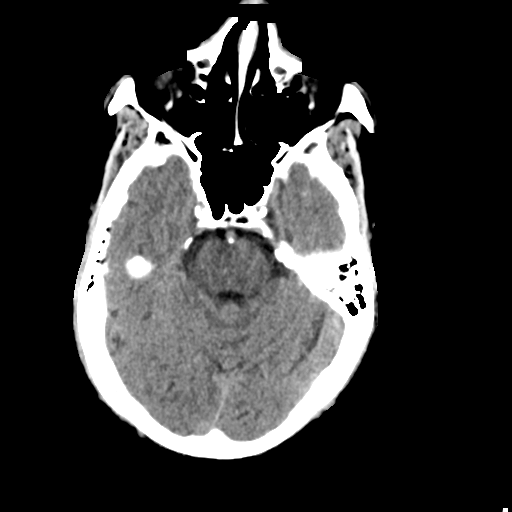
[im 11/34  bone]
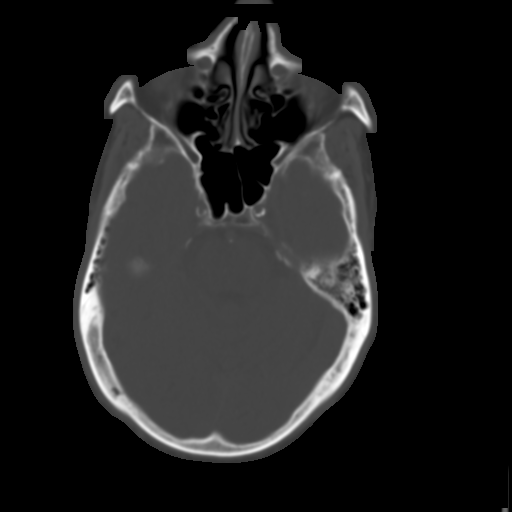
[im 13/34  brain]
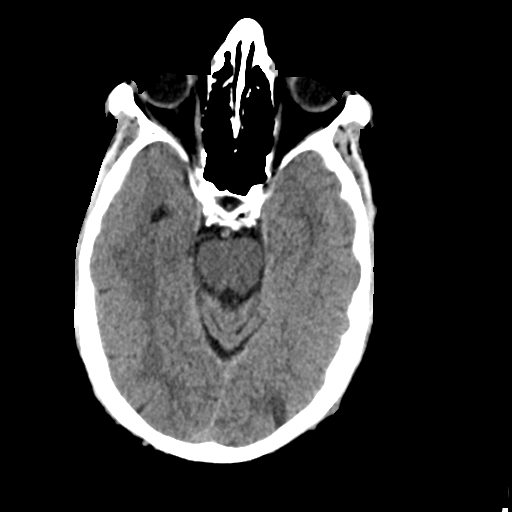
[im 15/34  brain]
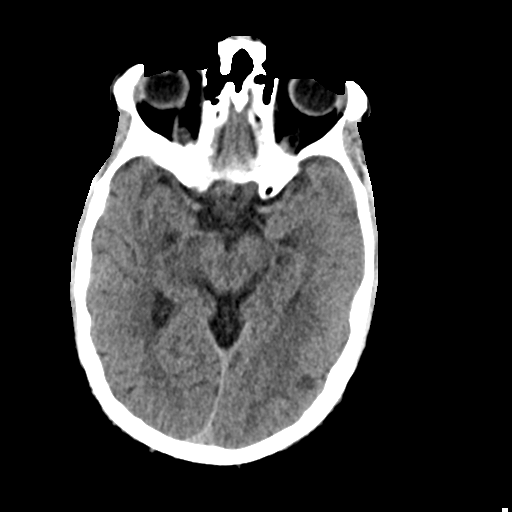
[im 18/34  brain]
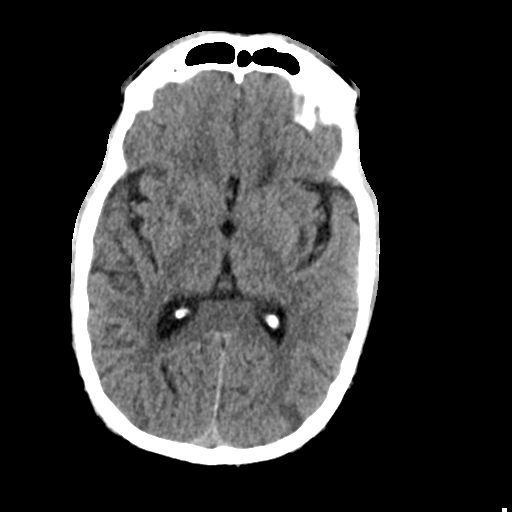
[im 19/34  brain]
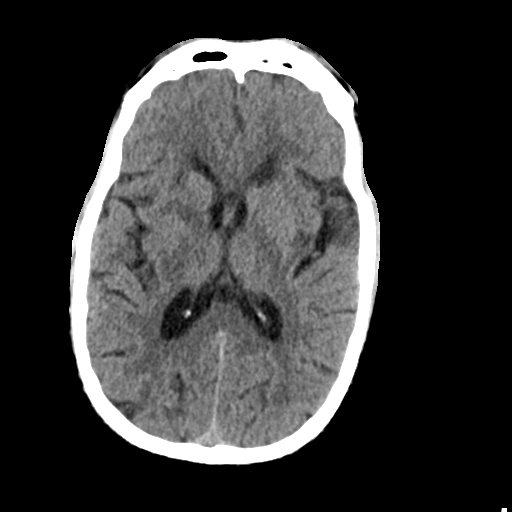
[im 19/34  bone]
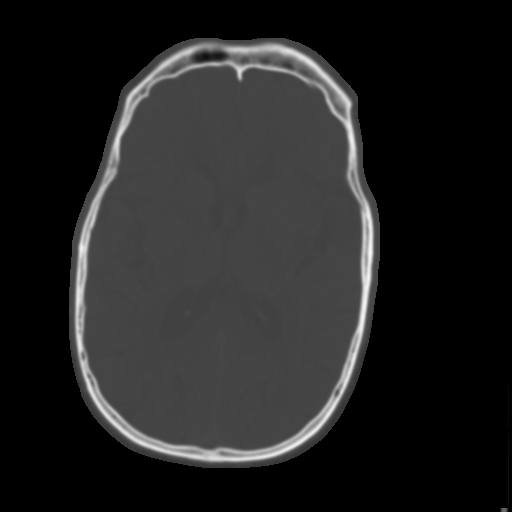
[im 21/34  brain]
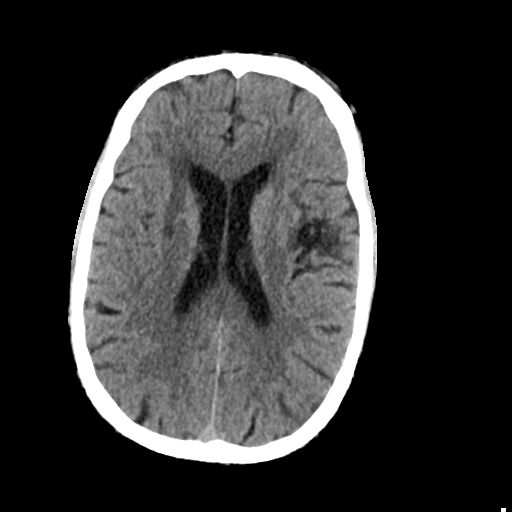
[im 23/34  brain]
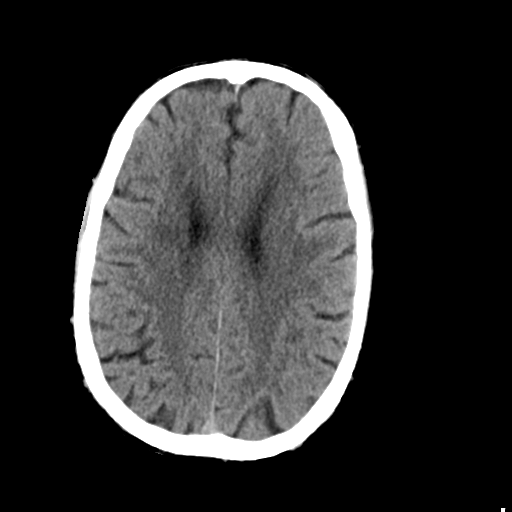
[im 26/34  brain]
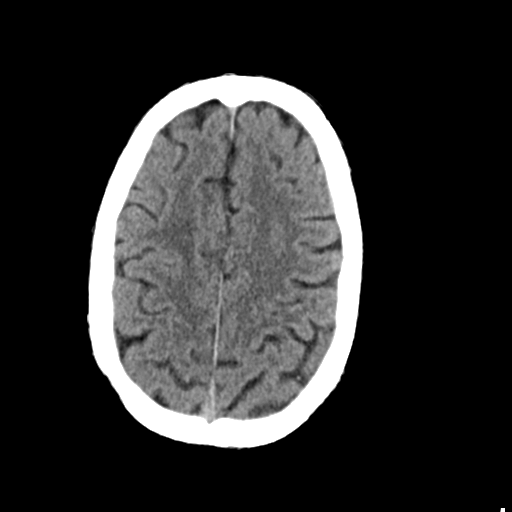
[im 28/34  brain]
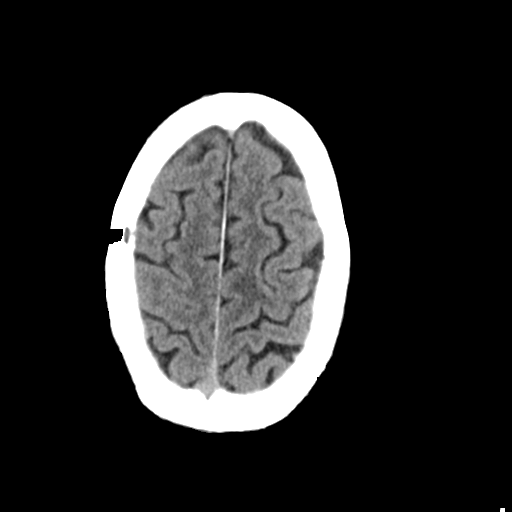
[im 28/34  bone]
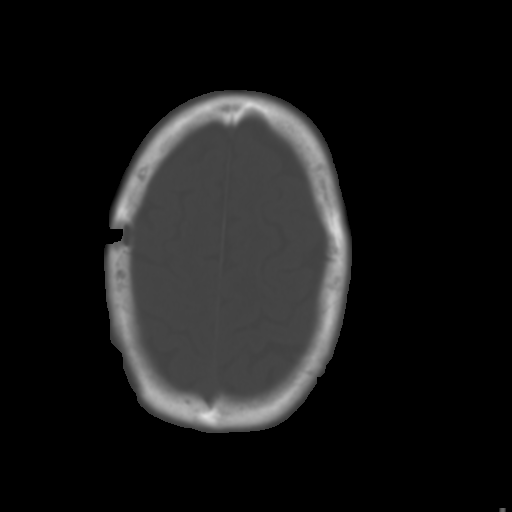
[im 30/34  brain]
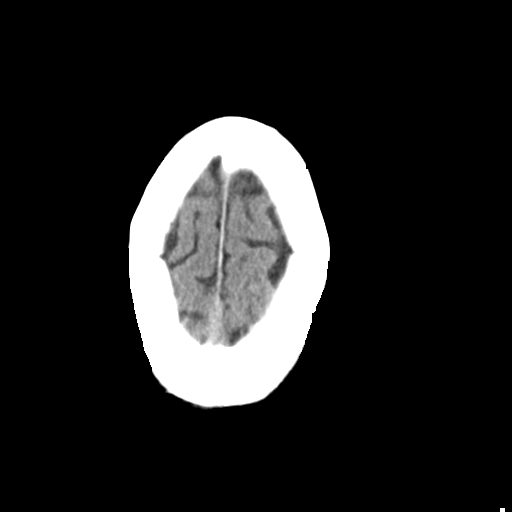
[im 32/34  brain]
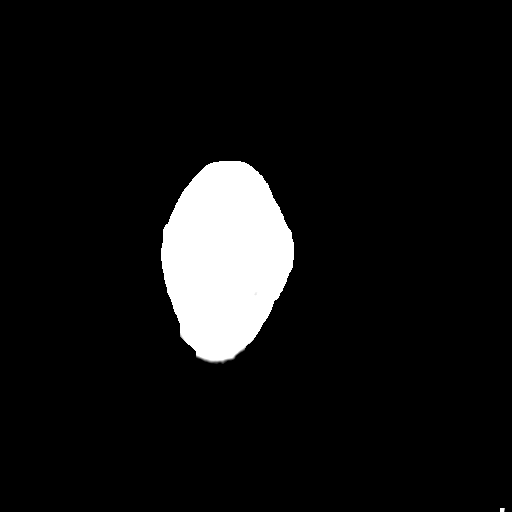

[15 of 30 positions shown; findings below may reference images not displayed]

FINDINGS: No evidence of parenchymal hemorrhage or extra-axial fluid
collection.

Residual hypodensity in the subcortical right frontal lobe with
central hyperdensity (series 2/ image 25), reflecting sequela of
prior abscess, improved.

Additional abscesses in the subcortical left frontal lobe and right
basal ganglia have improved/resolved.

New cortical hypodensities in the left occipital region (series 2/
images 13 and 17), worrisome for acute infarct.

No mass effect or midline shift.

Subcortical white matter and periventricular small vessel ischemic
changes. Intracranial atherosclerosis.

Cerebral volume is within normal limits.  No ventriculomegaly.

The visualized paranasal sinuses are essentially clear. The mastoid
air cells are unopacified.

No evidence of calvarial fracture.
IMPRESSION: New cortical hypodensities in the left occipital region, worrisome
for acute infarct.

Residual hypodensity in the subcortical right frontal lobe,
reflecting sequela of prior abscess, improved.

Additional abscesses in the subcortical left frontal lobe and right
basal ganglia have improved/resolved.

These results were called by telephone at the time of interpretation
on 07/22/2014 at [DATE] to Dr. Kunstdepot, who verbally acknowledged
these results.

## 2016-11-15 IMAGING — MR MR HEAD WO/W CM
10 of 14 series · 32 of 48 positions shown · IV contrast (multihance)
Comparison: CT head July 22, 2014 at 9003 hours and MRI of the
brain December 01, 2013

CLINICAL DATA: Syncopal episodes, woke up in bathtub this morning.
History of seizures, substance abuse, brain abscess.

EXAM:
MRI HEAD WITHOUT AND WITH CONTRAST
TECHNIQUE: Multiplanar, multiecho pulse sequences of the brain and surrounding
structures were obtained without and with intravenous contrast.
CONTRAST:  15mL MULTIHANCE GADOBENATE DIMEGLUMINE 529 MG/ML IV SOLN

[Series 4: DWI · axial · 3.0mm · 1.09mm/px · z∈[-52,+86]mm · 8 of 98 slices shown (1 of 4)]
[im 1/98]
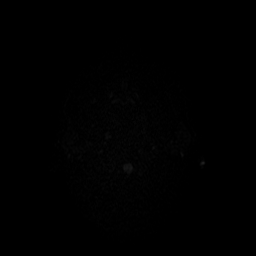
[im 14/98]
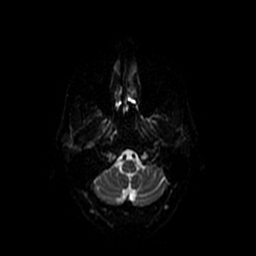
[im 28/98]
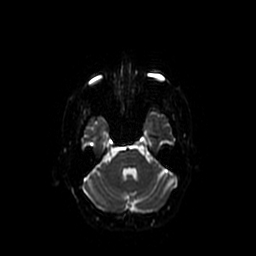
[im 42/98]
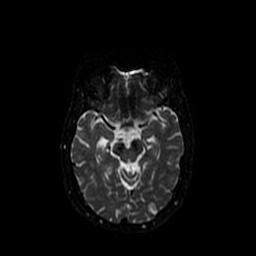
[im 56/98]
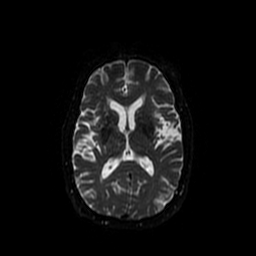
[im 70/98]
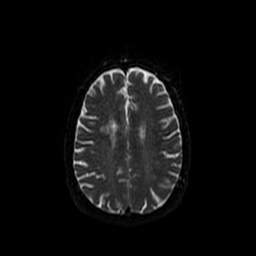
[im 84/98]
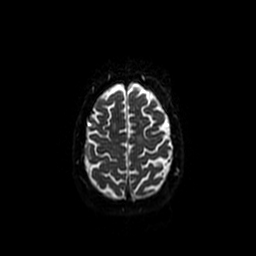
[im 98/98]
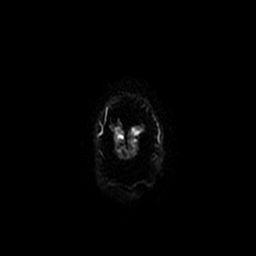

[Series 4: T1 post-contrast · coronal · 5.0mm · 0.43mm/px · 2 of 27 slices shown (1 of 2)]
[im 1/27]
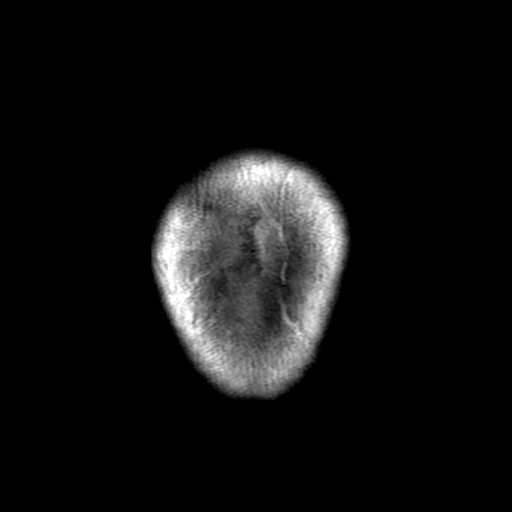
[im 27/27]
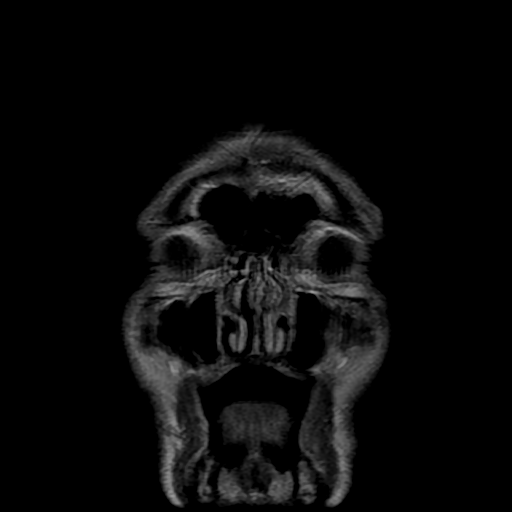

[Series 5: DWI · coronal · 5.0mm · 1.09mm/px · 5 of 66 slices shown (2 of 4)]
[im 1/66]
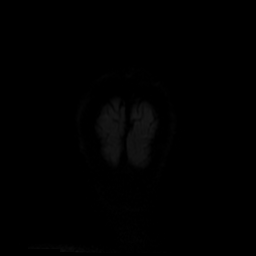
[im 17/66]
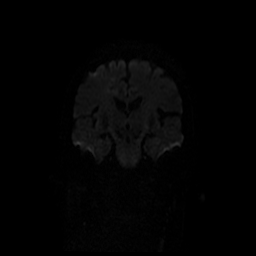
[im 33/66]
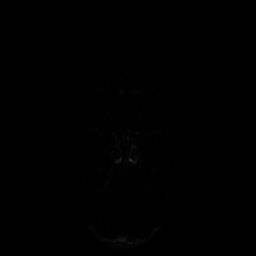
[im 49/66]
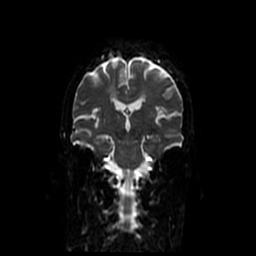
[im 66/66]
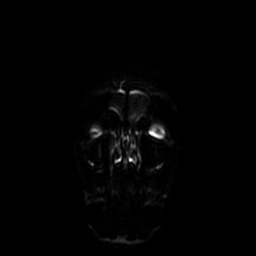

[Series 5: T1 post-contrast · sagittal · 5.0mm · 0.47mm/px · 2 of 23 slices shown (2 of 2)]
[im 1/23]
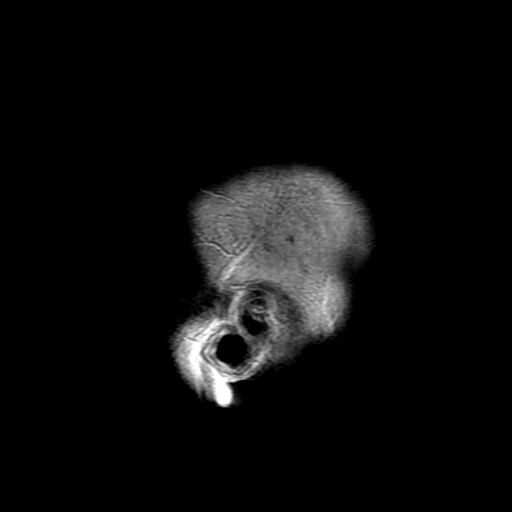
[im 23/23]
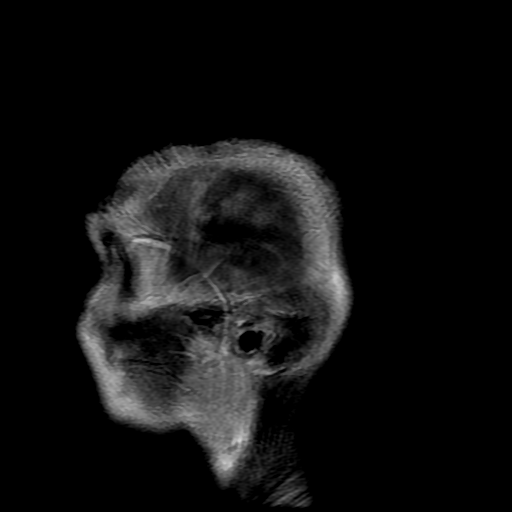

[Series 6: T2 · axial · 5.0mm · 0.43mm/px · z∈[-56,+83]mm · 2 of 25 slices shown (1 of 3)]
[im 1/25]
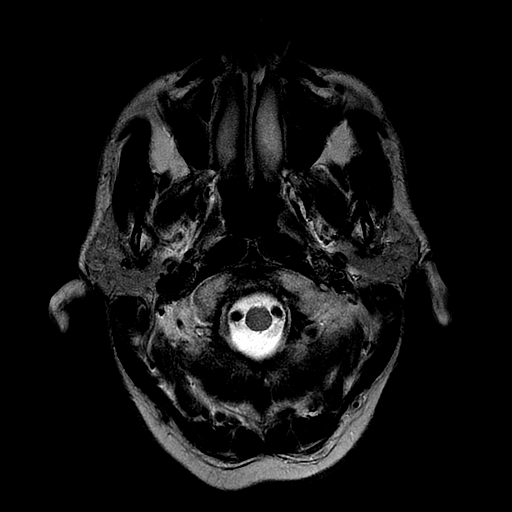
[im 25/25]
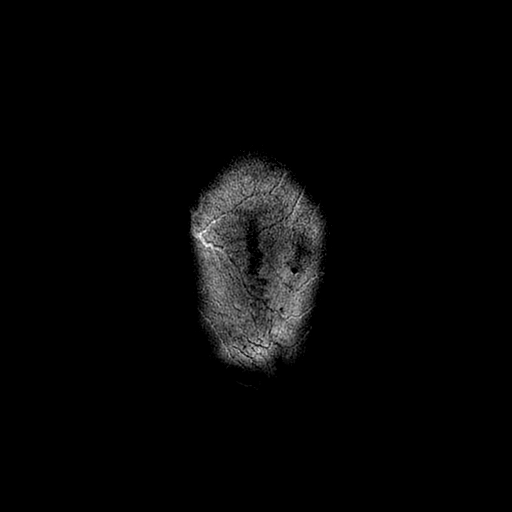

[Series 7: FLAIR · axial · 5.0mm · 0.43mm/px · z∈[-56,+83]mm · 2 of 25 slices shown]
[im 1/25]
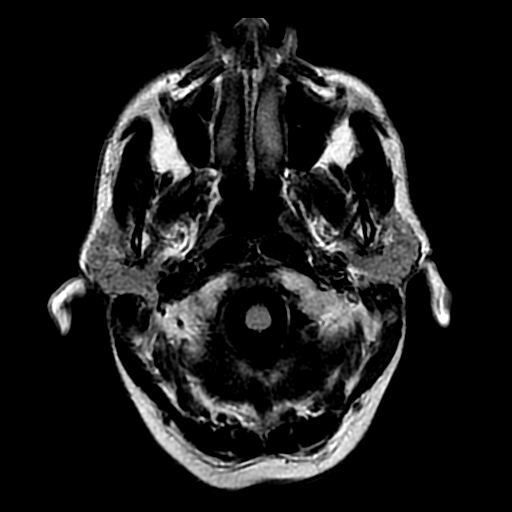
[im 25/25]
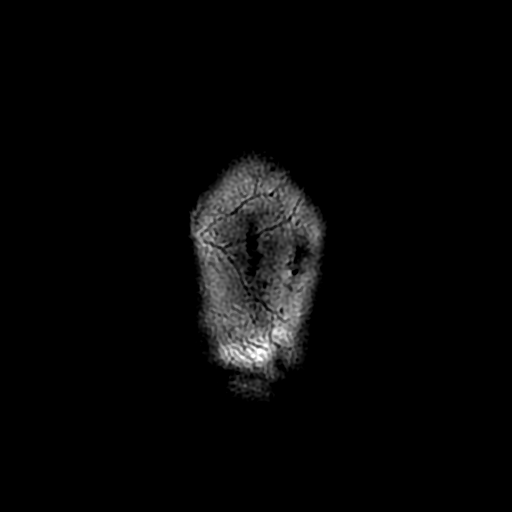

[Series 10: T2 · coronal · 5.0mm · 0.43mm/px · 2 of 28 slices shown (2 of 3)]
[im 1/28]
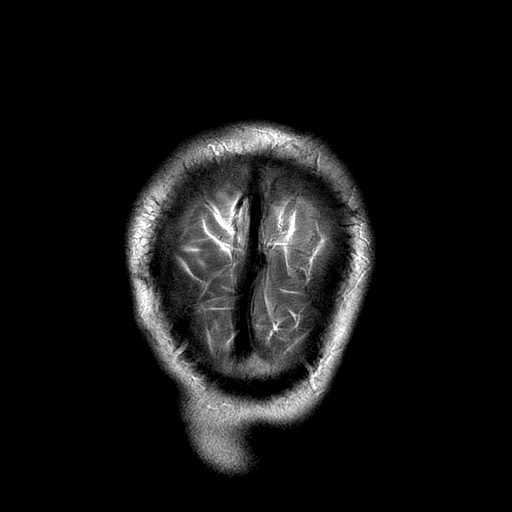
[im 28/28]
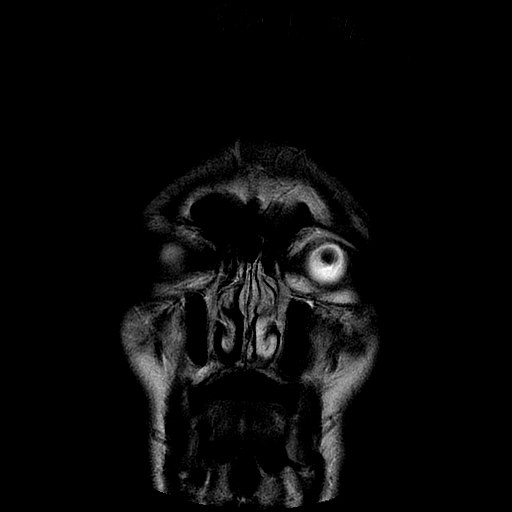

[Series 11: T2 · coronal · 3.0mm · 0.35mm/px · 2 of 29 slices shown (3 of 3)]
[im 1/29]
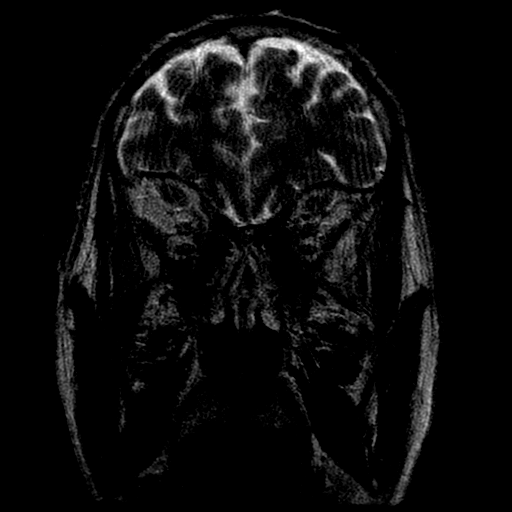
[im 29/29]
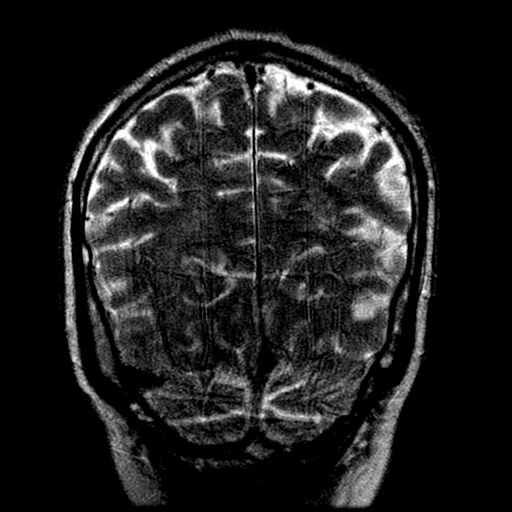

[Series 400: DWI · axial · 3.0mm · 1.09mm/px · z∈[-52,+86]mm · 4 of 49 slices shown (3 of 4)]
[im 1/49]
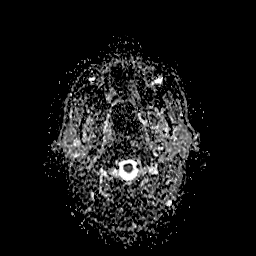
[im 17/49]
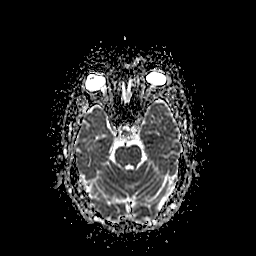
[im 33/49]
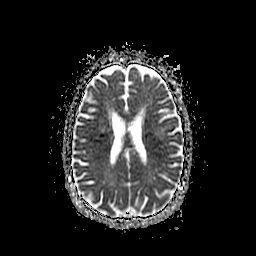
[im 49/49]
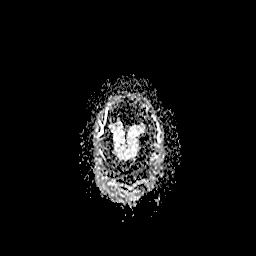

[Series 500: DWI · coronal · 5.0mm · 1.09mm/px · 3 of 33 slices shown (4 of 4)]
[im 1/33]
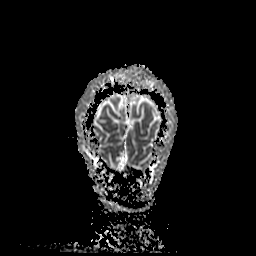
[im 17/33]
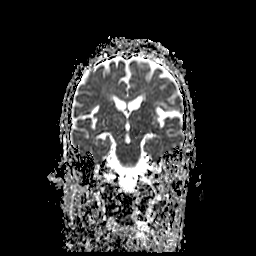
[im 33/33]
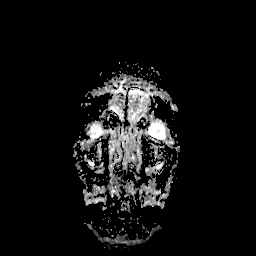

[32 of 48 positions shown; findings below may reference images not displayed]

FINDINGS: Multiple foci of reduced diffusion within the LEFT greater than
RIGHT occipital lobes, measuring up to 12 mm with rounded
configuration, corresponding low ADC values. In addition, LEFT
caudate head, bilateral bifrontal subcentimeter foci of reduced
diffusion. No superimposed abnormal enhancement. Subcentimeter focus
of susceptibility artifact RIGHT basal ganglia, RIGHT centrum
semiovale, LEFT frontal vertex at site of prior abscesses. No
midline shift, mass effect or mass lesions on today's examination.
Old RIGHT thalamus lacunar infarct. Mild encephalomalacia RIGHT
basal ganglia, RIGHT corona radiata and LEFT frontal vertex
corresponding to prior abscess. Faint enhancement RIGHT basal
ganglia at site of prior abscess.

No abnormal extra-axial fluid collections, extra-axial enhancement
nor extra-axial masses. Stable loss of LEFT internal carotid artery
flow void from the level of the cervical internal carotid artery, to
the cavernous segment, flow void LEFT supra clinoid internal carotid
artery suggesting retrograde flow.

Trace paranasal sinus mucosal thickening without air-fluid levels.
Ocular globes and orbital contents are nonsuspicious though not
tailored for evaluation.

No abnormal sellar expansion. No cerebellar tonsillar ectopia. No
suspicious calvarial bone marrow signal.
IMPRESSION: Biparietal, bifrontal and LEFT occipital, LEFT caudate head foci of
acute ischemia favoring watershed infarcts, less likely embolic.
Chronic LEFT internal carotid artery occlusion.

Old cerebral abscess with faint residual enhancement RIGHT basal
ganglia. No new abscess or MR findings of acute intracranial
infection on today's examination.

## 2017-01-29 ENCOUNTER — Encounter (HOSPITAL_COMMUNITY): Payer: Self-pay

## 2017-01-29 ENCOUNTER — Ambulatory Visit: Payer: Self-pay | Admitting: Family

## 2017-02-04 ENCOUNTER — Other Ambulatory Visit: Payer: Self-pay | Admitting: Vascular Surgery

## 2017-02-04 DIAGNOSIS — I739 Peripheral vascular disease, unspecified: Secondary | ICD-10-CM

## 2017-02-08 ENCOUNTER — Other Ambulatory Visit: Payer: Self-pay | Admitting: *Deleted

## 2017-02-08 DIAGNOSIS — I739 Peripheral vascular disease, unspecified: Secondary | ICD-10-CM

## 2017-02-08 MED ORDER — CLOPIDOGREL BISULFATE 75 MG PO TABS: 75.0000 mg | ORAL_TABLET | Freq: Every day | ORAL | 6 refills | Status: DC

## 2017-02-24 ENCOUNTER — Encounter (HOSPITAL_COMMUNITY): Payer: Self-pay

## 2017-02-24 ENCOUNTER — Emergency Department (HOSPITAL_COMMUNITY): Payer: Medicaid Other

## 2017-02-24 ENCOUNTER — Emergency Department (HOSPITAL_COMMUNITY)
Admission: EM | Admit: 2017-02-24 | Discharge: 2017-02-24 | Disposition: A | Discharge status: Home / Self Care | Payer: Medicaid Other | Attending: Emergency Medicine | Admitting: Emergency Medicine

## 2017-02-24 ENCOUNTER — Other Ambulatory Visit: Payer: Self-pay

## 2017-02-24 DIAGNOSIS — Z79899 Other long term (current) drug therapy: Secondary | ICD-10-CM | POA: Insufficient documentation

## 2017-02-24 DIAGNOSIS — I110 Hypertensive heart disease with heart failure: Secondary | ICD-10-CM | POA: Diagnosis not present

## 2017-02-24 DIAGNOSIS — J449 Chronic obstructive pulmonary disease, unspecified: Secondary | ICD-10-CM | POA: Insufficient documentation

## 2017-02-24 DIAGNOSIS — E119 Type 2 diabetes mellitus without complications: Secondary | ICD-10-CM | POA: Diagnosis not present

## 2017-02-24 DIAGNOSIS — R451 Restlessness and agitation: Secondary | ICD-10-CM | POA: Insufficient documentation

## 2017-02-24 DIAGNOSIS — Z5329 Procedure and treatment not carried out because of patient's decision for other reasons: Secondary | ICD-10-CM | POA: Diagnosis not present

## 2017-02-24 DIAGNOSIS — F17210 Nicotine dependence, cigarettes, uncomplicated: Secondary | ICD-10-CM | POA: Insufficient documentation

## 2017-02-24 DIAGNOSIS — Z7902 Long term (current) use of antithrombotics/antiplatelets: Secondary | ICD-10-CM | POA: Diagnosis not present

## 2017-02-24 DIAGNOSIS — R000 Tachycardia, unspecified: Secondary | ICD-10-CM | POA: Diagnosis not present

## 2017-02-24 DIAGNOSIS — J45909 Unspecified asthma, uncomplicated: Secondary | ICD-10-CM | POA: Insufficient documentation

## 2017-02-24 DIAGNOSIS — F419 Anxiety disorder, unspecified: Secondary | ICD-10-CM | POA: Insufficient documentation

## 2017-02-24 DIAGNOSIS — I509 Heart failure, unspecified: Secondary | ICD-10-CM | POA: Diagnosis not present

## 2017-02-24 DIAGNOSIS — R0602 Shortness of breath: Secondary | ICD-10-CM | POA: Diagnosis present

## 2017-02-24 DIAGNOSIS — Z96662 Presence of left artificial ankle joint: Secondary | ICD-10-CM | POA: Insufficient documentation

## 2017-02-24 LAB — CBC WITH DIFFERENTIAL/PLATELET
BASOS PCT: 0 %
Basophils Absolute: 0 10*3/uL (ref 0.0–0.1)
EOS ABS: 0.2 10*3/uL (ref 0.0–0.7)
Eosinophils Relative: 4 %
HCT: 40.6 % (ref 39.0–52.0)
HEMOGLOBIN: 13.1 g/dL (ref 13.0–17.0)
LYMPHS ABS: 1.2 10*3/uL (ref 0.7–4.0)
Lymphocytes Relative: 22 %
MCH: 29.9 pg (ref 26.0–34.0)
MCHC: 32.3 g/dL (ref 30.0–36.0)
MCV: 92.7 fL (ref 78.0–100.0)
MONO ABS: 0.5 10*3/uL (ref 0.1–1.0)
MONOS PCT: 9 %
Neutro Abs: 3.6 10*3/uL (ref 1.7–7.7)
Neutrophils Relative %: 65 %
Platelets: 229 10*3/uL (ref 150–400)
RBC: 4.38 MIL/uL (ref 4.22–5.81)
RDW: 13.5 % (ref 11.5–15.5)
WBC: 5.6 10*3/uL (ref 4.0–10.5)

## 2017-02-24 LAB — BASIC METABOLIC PANEL
Anion gap: 12 (ref 5–15)
BUN: 23 mg/dL — AB (ref 6–20)
CALCIUM: 8.9 mg/dL (ref 8.9–10.3)
CHLORIDE: 102 mmol/L (ref 101–111)
CO2: 25 mmol/L (ref 22–32)
CREATININE: 1.01 mg/dL (ref 0.61–1.24)
GFR calc Af Amer: >60 mL/min (ref >60)
GFR calc non Af Amer: >60 mL/min (ref >60)
GLUCOSE: 179 mg/dL — AB (ref 65–99)
Potassium: 3.8 mmol/L (ref 3.5–5.1)
Sodium: 139 mmol/L (ref 135–145)

## 2017-02-24 LAB — I-STAT TROPONIN, ED: TROPONIN I, POC: 0.02 ng/mL (ref 0.00–0.08)

## 2017-02-24 LAB — TROPONIN I: Troponin I: <0.03 ng/mL (ref <0.03)

## 2017-02-24 LAB — BRAIN NATRIURETIC PEPTIDE: B Natriuretic Peptide: 78.6 pg/mL (ref 0.0–100.0)

## 2017-02-24 MED ORDER — HYDROXYZINE HCL 25 MG PO TABS
25.0000 mg | ORAL_TABLET | Freq: Once | ORAL | Status: AC
  Administered 2017-02-24: 25 mg via ORAL
  Filled 2017-02-24: qty 1

## 2017-02-24 NOTE — ED Triage Notes (Signed)
[facility]- pt coming from motel; for SOB. EMS noted pt to be in SVT. Hx of same. Pt also has COPD. 18mg  of Adenosine total which slowed HR down briefly. Pt given 5mg  of lopressor, HR now 75. Pt also had 5mg  of albuterol for wheezing.
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
## 2017-02-24 NOTE — ED Notes (Signed)
Pt transported to xray, pt remove cardiac leads prior to being transported due to anxiety.

## 2017-02-24 NOTE — ED Notes (Signed)
Pt states he is anxious and wants to leave. EDP made aware. Pt appears stable, no distress noted. IV removed. Pt ambulatory to the lobby, pt signed AMA document.

## 2017-03-17 ENCOUNTER — Inpatient Hospital Stay (HOSPITAL_COMMUNITY)
Admission: EM | Admit: 2017-03-17 | Discharge: 2017-03-22 | DRG: 580 | Disposition: A | Discharge status: Home / Self Care | Payer: Medicaid Other | Attending: Vascular Surgery | Admitting: Vascular Surgery

## 2017-03-17 ENCOUNTER — Emergency Department (HOSPITAL_COMMUNITY): Payer: Medicaid Other | Admitting: Anesthesiology

## 2017-03-17 ENCOUNTER — Encounter (HOSPITAL_COMMUNITY)
Admission: EM | Disposition: A | Discharge status: Home / Self Care | Payer: Self-pay | Source: Home / Self Care | Attending: Vascular Surgery

## 2017-03-17 ENCOUNTER — Emergency Department (HOSPITAL_COMMUNITY): Payer: Medicaid Other

## 2017-03-17 ENCOUNTER — Other Ambulatory Visit: Payer: Self-pay

## 2017-03-17 ENCOUNTER — Encounter (HOSPITAL_COMMUNITY): Payer: Self-pay | Admitting: Emergency Medicine

## 2017-03-17 DIAGNOSIS — Z8661 Personal history of infections of the central nervous system: Secondary | ICD-10-CM

## 2017-03-17 DIAGNOSIS — E785 Hyperlipidemia, unspecified: Secondary | ICD-10-CM | POA: Diagnosis present

## 2017-03-17 DIAGNOSIS — K219 Gastro-esophageal reflux disease without esophagitis: Secondary | ICD-10-CM | POA: Diagnosis present

## 2017-03-17 DIAGNOSIS — Z888 Allergy status to other drugs, medicaments and biological substances status: Secondary | ICD-10-CM

## 2017-03-17 DIAGNOSIS — F17210 Nicotine dependence, cigarettes, uncomplicated: Secondary | ICD-10-CM | POA: Diagnosis present

## 2017-03-17 DIAGNOSIS — Y712 Prosthetic and other implants, materials and accessory cardiovascular devices associated with adverse incidents: Secondary | ICD-10-CM | POA: Diagnosis not present

## 2017-03-17 DIAGNOSIS — T82398A Other mechanical complication of other vascular grafts, initial encounter: Secondary | ICD-10-CM | POA: Diagnosis not present

## 2017-03-17 DIAGNOSIS — G4730 Sleep apnea, unspecified: Secondary | ICD-10-CM | POA: Diagnosis present

## 2017-03-17 DIAGNOSIS — F1110 Opioid abuse, uncomplicated: Secondary | ICD-10-CM | POA: Diagnosis present

## 2017-03-17 DIAGNOSIS — T82898A Other specified complication of vascular prosthetic devices, implants and grafts, initial encounter: Secondary | ICD-10-CM

## 2017-03-17 DIAGNOSIS — L02415 Cutaneous abscess of right lower limb: Secondary | ICD-10-CM | POA: Diagnosis present

## 2017-03-17 DIAGNOSIS — T82838A Hemorrhage of vascular prosthetic devices, implants and grafts, initial encounter: Secondary | ICD-10-CM | POA: Diagnosis not present

## 2017-03-17 DIAGNOSIS — Z8614 Personal history of Methicillin resistant Staphylococcus aureus infection: Secondary | ICD-10-CM

## 2017-03-17 DIAGNOSIS — Z885 Allergy status to narcotic agent status: Secondary | ICD-10-CM

## 2017-03-17 DIAGNOSIS — Z881 Allergy status to other antibiotic agents status: Secondary | ICD-10-CM

## 2017-03-17 DIAGNOSIS — Z95828 Presence of other vascular implants and grafts: Secondary | ICD-10-CM | POA: Diagnosis not present

## 2017-03-17 DIAGNOSIS — Z7902 Long term (current) use of antithrombotics/antiplatelets: Secondary | ICD-10-CM

## 2017-03-17 DIAGNOSIS — Z86711 Personal history of pulmonary embolism: Secondary | ICD-10-CM

## 2017-03-17 DIAGNOSIS — M79661 Pain in right lower leg: Secondary | ICD-10-CM | POA: Diagnosis present

## 2017-03-17 DIAGNOSIS — Z95810 Presence of automatic (implantable) cardiac defibrillator: Secondary | ICD-10-CM | POA: Diagnosis not present

## 2017-03-17 DIAGNOSIS — I110 Hypertensive heart disease with heart failure: Secondary | ICD-10-CM | POA: Diagnosis present

## 2017-03-17 DIAGNOSIS — B9561 Methicillin susceptible Staphylococcus aureus infection as the cause of diseases classified elsewhere: Secondary | ICD-10-CM | POA: Diagnosis present

## 2017-03-17 DIAGNOSIS — I509 Heart failure, unspecified: Secondary | ICD-10-CM | POA: Diagnosis present

## 2017-03-17 DIAGNOSIS — M069 Rheumatoid arthritis, unspecified: Secondary | ICD-10-CM | POA: Diagnosis present

## 2017-03-17 DIAGNOSIS — Z91013 Allergy to seafood: Secondary | ICD-10-CM

## 2017-03-17 DIAGNOSIS — Z7951 Long term (current) use of inhaled steroids: Secondary | ICD-10-CM

## 2017-03-17 DIAGNOSIS — K449 Diaphragmatic hernia without obstruction or gangrene: Secondary | ICD-10-CM | POA: Diagnosis present

## 2017-03-17 DIAGNOSIS — E1151 Type 2 diabetes mellitus with diabetic peripheral angiopathy without gangrene: Secondary | ICD-10-CM | POA: Diagnosis present

## 2017-03-17 DIAGNOSIS — J449 Chronic obstructive pulmonary disease, unspecified: Secondary | ICD-10-CM | POA: Diagnosis present

## 2017-03-17 DIAGNOSIS — Z8673 Personal history of transient ischemic attack (TIA), and cerebral infarction without residual deficits: Secondary | ICD-10-CM

## 2017-03-17 HISTORY — PX: FEMORAL-TIBIAL BYPASS GRAFT: SHX938

## 2017-03-17 LAB — I-STAT CG4 LACTIC ACID, ED: LACTIC ACID, VENOUS: 0.73 mmol/L (ref 0.5–1.9)

## 2017-03-17 LAB — COMPREHENSIVE METABOLIC PANEL
ALBUMIN: 2.9 g/dL — AB (ref 3.5–5.0)
ALK PHOS: 78 U/L (ref 38–126)
ALT: 106 U/L — AB (ref 17–63)
AST: 79 U/L — AB (ref 15–41)
Anion gap: 9 (ref 5–15)
BILIRUBIN TOTAL: 1 mg/dL (ref 0.3–1.2)
BUN: 9 mg/dL (ref 6–20)
CALCIUM: 8.5 mg/dL — AB (ref 8.9–10.3)
CO2: 25 mmol/L (ref 22–32)
Chloride: 100 mmol/L — ABNORMAL LOW (ref 101–111)
Creatinine, Ser: 1.12 mg/dL (ref 0.61–1.24)
GFR calc Af Amer: >60 mL/min (ref >60)
GFR calc non Af Amer: >60 mL/min (ref >60)
GLUCOSE: 122 mg/dL — AB (ref 65–99)
Potassium: 4.6 mmol/L (ref 3.5–5.1)
SODIUM: 134 mmol/L — AB (ref 135–145)
TOTAL PROTEIN: 7.5 g/dL (ref 6.5–8.1)

## 2017-03-17 LAB — CBC WITH DIFFERENTIAL/PLATELET
BASOS ABS: 0 10*3/uL (ref 0.0–0.1)
BASOS PCT: 0 %
Eosinophils Absolute: 0.4 10*3/uL (ref 0.0–0.7)
Eosinophils Relative: 4 %
HEMATOCRIT: 41.6 % (ref 39.0–52.0)
HEMOGLOBIN: 13.9 g/dL (ref 13.0–17.0)
Lymphocytes Relative: 21 %
Lymphs Abs: 1.8 10*3/uL (ref 0.7–4.0)
MCH: 30.8 pg (ref 26.0–34.0)
MCHC: 33.4 g/dL (ref 30.0–36.0)
MCV: 92 fL (ref 78.0–100.0)
MONOS PCT: 8 %
Monocytes Absolute: 0.7 10*3/uL (ref 0.1–1.0)
NEUTROS ABS: 5.6 10*3/uL (ref 1.7–7.7)
NEUTROS PCT: 67 %
Platelets: 264 10*3/uL (ref 150–400)
RBC: 4.52 MIL/uL (ref 4.22–5.81)
RDW: 13.7 % (ref 11.5–15.5)
WBC: 8.4 10*3/uL (ref 4.0–10.5)

## 2017-03-17 LAB — GLUCOSE, CAPILLARY: Glucose-Capillary: 97 mg/dL (ref 65–99)

## 2017-03-17 LAB — PROTIME-INR
INR: 1.13
Prothrombin Time: 14.5 seconds (ref 11.4–15.2)

## 2017-03-17 SURGERY — CREATION, BYPASS, ARTERIAL, FEMORAL TO TIBIAL, USING GRAFT
Anesthesia: General | Laterality: Right

## 2017-03-17 MED ORDER — SODIUM CHLORIDE 0.9 % IV SOLN
INTRAVENOUS | Status: DC | PRN
  Administered 2017-03-17: 17:00:00

## 2017-03-17 MED ORDER — 0.9 % SODIUM CHLORIDE (POUR BTL) OPTIME
TOPICAL | Status: DC | PRN
  Administered 2017-03-17: 2000 mL

## 2017-03-17 MED ORDER — VANCOMYCIN HCL 10 G IV SOLR
1500.0000 mg | Freq: Once | INTRAVENOUS | Status: AC
  Administered 2017-03-17: 1500 mg via INTRAVENOUS
  Filled 2017-03-17: qty 1500

## 2017-03-17 MED ORDER — FENTANYL CITRATE (PF) 100 MCG/2ML IJ SOLN
INTRAMUSCULAR | Status: AC
  Filled 2017-03-17: qty 2

## 2017-03-17 MED ORDER — ONDANSETRON HCL 4 MG/2ML IJ SOLN
INTRAMUSCULAR | Status: DC | PRN
  Administered 2017-03-17: 4 mg via INTRAVENOUS

## 2017-03-17 MED ORDER — PANTOPRAZOLE SODIUM 40 MG PO TBEC
40.0000 mg | DELAYED_RELEASE_TABLET | Freq: Every day | ORAL | Status: DC
  Administered 2017-03-17 – 2017-03-22 (×5): 40 mg via ORAL
  Filled 2017-03-17 (×5): qty 1

## 2017-03-17 MED ORDER — DEXAMETHASONE SODIUM PHOSPHATE 10 MG/ML IJ SOLN
INTRAMUSCULAR | Status: DC | PRN
  Administered 2017-03-17: 10 mg via INTRAVENOUS

## 2017-03-17 MED ORDER — PIPERACILLIN-TAZOBACTAM 3.375 G IVPB 30 MIN
3.3750 g | Freq: Once | INTRAVENOUS | Status: AC
  Administered 2017-03-17: 3.375 g via INTRAVENOUS
  Filled 2017-03-17: qty 50

## 2017-03-17 MED ORDER — DOCUSATE SODIUM 100 MG PO CAPS
100.0000 mg | ORAL_CAPSULE | Freq: Two times a day (BID) | ORAL | Status: DC
  Administered 2017-03-17 – 2017-03-21 (×8): 100 mg via ORAL
  Filled 2017-03-17 (×9): qty 1

## 2017-03-17 MED ORDER — DEXMEDETOMIDINE HCL 200 MCG/2ML IV SOLN
INTRAVENOUS | Status: DC | PRN
  Administered 2017-03-17: 20 ug via INTRAVENOUS

## 2017-03-17 MED ORDER — SODIUM CHLORIDE 0.9% FLUSH
3.0000 mL | Freq: Two times a day (BID) | INTRAVENOUS | Status: DC
  Administered 2017-03-19 – 2017-03-22 (×5): 3 mL via INTRAVENOUS

## 2017-03-17 MED ORDER — IOPAMIDOL (ISOVUE-370) INJECTION 76%
INTRAVENOUS | Status: AC
  Administered 2017-03-17: 100 mL via INTRAVENOUS
  Filled 2017-03-17: qty 100

## 2017-03-17 MED ORDER — PROMETHAZINE HCL 25 MG/ML IJ SOLN: 6.2500 mg | INTRAMUSCULAR | Status: DC | PRN

## 2017-03-17 MED ORDER — FENTANYL CITRATE (PF) 250 MCG/5ML IJ SOLN
INTRAMUSCULAR | Status: DC | PRN
  Administered 2017-03-17 (×2): 75 ug via INTRAVENOUS
  Administered 2017-03-17: 150 ug via INTRAVENOUS
  Administered 2017-03-17 (×2): 100 ug via INTRAVENOUS

## 2017-03-17 MED ORDER — MIDAZOLAM HCL 2 MG/2ML IJ SOLN
INTRAMUSCULAR | Status: DC | PRN
  Administered 2017-03-17: 2 mg via INTRAVENOUS

## 2017-03-17 MED ORDER — LABETALOL HCL 5 MG/ML IV SOLN: 10.0000 mg | INTRAVENOUS | Status: DC | PRN

## 2017-03-17 MED ORDER — HYDROMORPHONE HCL 1 MG/ML IJ SOLN
INTRAMUSCULAR | Status: DC | PRN
  Administered 2017-03-17: 1 mg via INTRAVENOUS

## 2017-03-17 MED ORDER — HYDROMORPHONE HCL 1 MG/ML IJ SOLN
INTRAMUSCULAR | Status: AC
  Filled 2017-03-17: qty 1

## 2017-03-17 MED ORDER — ONDANSETRON HCL 4 MG/2ML IJ SOLN: 4.0000 mg | Freq: Once | INTRAMUSCULAR | Status: DC | PRN

## 2017-03-17 MED ORDER — PHENOL 1.4 % MT LIQD: 1.0000 | OROMUCOSAL | Status: DC | PRN

## 2017-03-17 MED ORDER — ROCURONIUM BROMIDE 10 MG/ML (PF) SYRINGE
PREFILLED_SYRINGE | INTRAVENOUS | Status: DC | PRN
  Administered 2017-03-17: 50 mg via INTRAVENOUS
  Administered 2017-03-17: 20 mg via INTRAVENOUS

## 2017-03-17 MED ORDER — SUGAMMADEX SODIUM 200 MG/2ML IV SOLN
INTRAVENOUS | Status: DC | PRN
  Administered 2017-03-17: 200 mg via INTRAVENOUS

## 2017-03-17 MED ORDER — FENTANYL CITRATE (PF) 250 MCG/5ML IJ SOLN
INTRAMUSCULAR | Status: AC
  Filled 2017-03-17: qty 5

## 2017-03-17 MED ORDER — MIDAZOLAM HCL 2 MG/2ML IJ SOLN
INTRAMUSCULAR | Status: AC
  Filled 2017-03-17: qty 2

## 2017-03-17 MED ORDER — SODIUM CHLORIDE 0.9 % IV SOLN
250.0000 mL | INTRAVENOUS | Status: DC | PRN
  Administered 2017-03-19: 250 mL via INTRAVENOUS

## 2017-03-17 MED ORDER — VANCOMYCIN HCL IN DEXTROSE 1-5 GM/200ML-% IV SOLN
1000.0000 mg | Freq: Three times a day (TID) | INTRAVENOUS | Status: DC
  Administered 2017-03-17 – 2017-03-20 (×7): 1000 mg via INTRAVENOUS
  Filled 2017-03-17 (×10): qty 200

## 2017-03-17 MED ORDER — GUAIFENESIN-DM 100-10 MG/5ML PO SYRP: 15.0000 mL | ORAL_SOLUTION | ORAL | Status: DC | PRN

## 2017-03-17 MED ORDER — FENTANYL CITRATE (PF) 100 MCG/2ML IJ SOLN
50.0000 ug | Freq: Once | INTRAMUSCULAR | Status: AC
  Administered 2017-03-17: 50 ug via INTRAVENOUS
  Filled 2017-03-17: qty 2

## 2017-03-17 MED ORDER — SODIUM CHLORIDE 0.9% FLUSH: 3.0000 mL | INTRAVENOUS | Status: DC | PRN

## 2017-03-17 MED ORDER — LIDOCAINE HCL 2 % IJ SOLN
20.0000 mL | Freq: Once | INTRAMUSCULAR | Status: DC
  Filled 2017-03-17: qty 20

## 2017-03-17 MED ORDER — VANCOMYCIN HCL IN DEXTROSE 1-5 GM/200ML-% IV SOLN
1000.0000 mg | Freq: Once | INTRAVENOUS | Status: DC
Start: 2017-03-17 — End: 2017-03-17
  Filled 2017-03-17: qty 200

## 2017-03-17 MED ORDER — LACTATED RINGERS IV SOLN
INTRAVENOUS | Status: DC
  Administered 2017-03-17 (×2): via INTRAVENOUS
  Administered 2017-03-19: 1000 mL via INTRAVENOUS

## 2017-03-17 MED ORDER — PIPERACILLIN-TAZOBACTAM 3.375 G IVPB
3.3750 g | Freq: Three times a day (TID) | INTRAVENOUS | Status: DC
  Administered 2017-03-17 – 2017-03-22 (×16): 3.375 g via INTRAVENOUS
  Filled 2017-03-17 (×16): qty 50

## 2017-03-17 MED ORDER — THROMBIN 20000 UNITS EX SOLR
CUTANEOUS | Status: AC
  Filled 2017-03-17: qty 20000

## 2017-03-17 MED ORDER — PIPERACILLIN-TAZOBACTAM IN DEX 2-0.25 GM/50ML IV SOLN
2.2500 g | Freq: Once | INTRAVENOUS | Status: DC
Start: 2017-03-17 — End: 2017-03-17

## 2017-03-17 MED ORDER — HYDROMORPHONE HCL 1 MG/ML IJ SOLN
0.5000 mg | INTRAMUSCULAR | Status: DC | PRN
  Administered 2017-03-17 – 2017-03-20 (×26): 1 mg via INTRAVENOUS
  Administered 2017-03-21 (×2): 0.5 mg via INTRAVENOUS
  Administered 2017-03-21 (×3): 1 mg via INTRAVENOUS
  Administered 2017-03-21 – 2017-03-22 (×4): 0.5 mg via INTRAVENOUS
  Filled 2017-03-17 (×4): qty 1
  Filled 2017-03-17: qty 0.5
  Filled 2017-03-17 (×3): qty 1
  Filled 2017-03-17: qty 0.5
  Filled 2017-03-17 (×3): qty 1
  Filled 2017-03-17: qty 0.5
  Filled 2017-03-17 (×6): qty 1
  Filled 2017-03-17 (×2): qty 0.5
  Filled 2017-03-17 (×2): qty 1
  Filled 2017-03-17: qty 0.5
  Filled 2017-03-17 (×5): qty 1
  Filled 2017-03-17: qty 0.5
  Filled 2017-03-17 (×7): qty 1

## 2017-03-17 MED ORDER — FENTANYL CITRATE (PF) 100 MCG/2ML IJ SOLN
25.0000 ug | INTRAMUSCULAR | Status: DC | PRN
Start: 2017-03-17 — End: 2017-03-17
  Administered 2017-03-17 (×4): 25 ug via INTRAVENOUS

## 2017-03-17 MED ORDER — ACETAMINOPHEN 650 MG RE SUPP
325.0000 mg | RECTAL | Status: DC | PRN
  Filled 2017-03-17: qty 2

## 2017-03-17 MED ORDER — VANCOMYCIN HCL IN DEXTROSE 1-5 GM/200ML-% IV SOLN
1000.0000 mg | Freq: Two times a day (BID) | INTRAVENOUS | Status: DC
  Filled 2017-03-17: qty 200

## 2017-03-17 MED ORDER — ALUM & MAG HYDROXIDE-SIMETH 200-200-20 MG/5ML PO SUSP: 15.0000 mL | ORAL | Status: DC | PRN

## 2017-03-17 MED ORDER — LIDOCAINE 2% (20 MG/ML) 5 ML SYRINGE
INTRAMUSCULAR | Status: DC | PRN
  Administered 2017-03-17: 40 mg via INTRAVENOUS

## 2017-03-17 MED ORDER — POTASSIUM CHLORIDE CRYS ER 20 MEQ PO TBCR
20.0000 meq | EXTENDED_RELEASE_TABLET | Freq: Once | ORAL | Status: AC
  Administered 2017-03-17: 20 meq via ORAL
  Filled 2017-03-17: qty 1

## 2017-03-17 MED ORDER — NICOTINE 7 MG/24HR TD PT24
7.0000 mg | MEDICATED_PATCH | Freq: Once | TRANSDERMAL | Status: AC
  Administered 2017-03-17: 7 mg via TRANSDERMAL
  Filled 2017-03-17 (×3): qty 1

## 2017-03-17 MED ORDER — CLOPIDOGREL BISULFATE 75 MG PO TABS
75.0000 mg | ORAL_TABLET | Freq: Every day | ORAL | Status: DC
  Administered 2017-03-17 – 2017-03-22 (×5): 75 mg via ORAL
  Filled 2017-03-17 (×5): qty 1

## 2017-03-17 MED ORDER — ALBUTEROL SULFATE (2.5 MG/3ML) 0.083% IN NEBU: 3.0000 mL | INHALATION_SOLUTION | RESPIRATORY_TRACT | Status: DC | PRN

## 2017-03-17 MED ORDER — SODIUM CHLORIDE 0.9 % IV BOLUS (SEPSIS)
1000.0000 mL | Freq: Once | INTRAVENOUS | Status: AC
  Administered 2017-03-17: 1000 mL via INTRAVENOUS

## 2017-03-17 MED ORDER — PIPERACILLIN-TAZOBACTAM 3.375 G IVPB: 3.3750 g | Freq: Four times a day (QID) | INTRAVENOUS | Status: DC

## 2017-03-17 MED ORDER — PAPAVERINE HCL 30 MG/ML IJ SOLN
INTRAMUSCULAR | Status: AC
  Filled 2017-03-17: qty 2

## 2017-03-17 MED ORDER — PROPOFOL 10 MG/ML IV BOLUS
INTRAVENOUS | Status: DC | PRN
  Administered 2017-03-17: 20 mg via INTRAVENOUS
  Administered 2017-03-17: 130 mg via INTRAVENOUS

## 2017-03-17 MED ORDER — LACTATED RINGERS IV SOLN
INTRAVENOUS | Status: DC | PRN
  Administered 2017-03-17: 17:00:00 via INTRAVENOUS

## 2017-03-17 MED ORDER — ALBUTEROL SULFATE (2.5 MG/3ML) 0.083% IN NEBU: 2.5000 mg | INHALATION_SOLUTION | Freq: Four times a day (QID) | RESPIRATORY_TRACT | Status: DC | PRN

## 2017-03-17 MED ORDER — HYDRALAZINE HCL 20 MG/ML IJ SOLN: 5.0000 mg | INTRAMUSCULAR | Status: DC | PRN

## 2017-03-17 MED ORDER — ONDANSETRON HCL 4 MG/2ML IJ SOLN: 4.0000 mg | Freq: Four times a day (QID) | INTRAMUSCULAR | Status: DC | PRN

## 2017-03-17 MED ORDER — ACETAMINOPHEN 325 MG PO TABS: 325.0000 mg | ORAL_TABLET | ORAL | Status: DC | PRN

## 2017-03-17 MED ORDER — PROPOFOL 10 MG/ML IV BOLUS
INTRAVENOUS | Status: AC
  Filled 2017-03-17: qty 20

## 2017-03-17 MED ORDER — PREGABALIN 75 MG PO CAPS
75.0000 mg | ORAL_CAPSULE | Freq: Every day | ORAL | Status: DC
  Administered 2017-03-17 – 2017-03-22 (×5): 75 mg via ORAL
  Filled 2017-03-17: qty 1
  Filled 2017-03-17: qty 3
  Filled 2017-03-17 (×3): qty 1

## 2017-03-17 MED ORDER — FENTANYL CITRATE (PF) 100 MCG/2ML IJ SOLN: 25.0000 ug | INTRAMUSCULAR | Status: DC | PRN

## 2017-03-17 MED ORDER — IOPAMIDOL (ISOVUE-300) INJECTION 61%
INTRAVENOUS | Status: AC
  Administered 2017-03-17: 100 mL
  Filled 2017-03-17: qty 100

## 2017-03-17 MED ORDER — METOPROLOL TARTRATE 5 MG/5ML IV SOLN: 2.0000 mg | INTRAVENOUS | Status: DC | PRN

## 2017-03-17 MED ORDER — OXYCODONE HCL 5 MG PO TABS
5.0000 mg | ORAL_TABLET | ORAL | Status: DC | PRN
  Administered 2017-03-17 – 2017-03-22 (×24): 10 mg via ORAL
  Filled 2017-03-17 (×24): qty 2

## 2017-03-17 SURGICAL SUPPLY — 52 items
ADH SKN CLS APL DERMABOND .7 (GAUZE/BANDAGES/DRESSINGS) ×1
AGENT HMST SPONGE THK3/8 (HEMOSTASIS)
BANDAGE ESMARK 6X9 LF (GAUZE/BANDAGES/DRESSINGS) IMPLANT
BNDG CMPR 9X6 STRL LF SNTH (GAUZE/BANDAGES/DRESSINGS)
BNDG ESMARK 6X9 LF (GAUZE/BANDAGES/DRESSINGS)
BNDG GAUZE ELAST 4 BULKY (GAUZE/BANDAGES/DRESSINGS) ×1 IMPLANT
CANISTER SUCT 3000ML PPV (MISCELLANEOUS) ×2 IMPLANT
CANNULA VESSEL 3MM 2 BLNT TIP (CANNULA) ×4 IMPLANT
CLIP VESOCCLUDE MED 24/CT (CLIP) ×2 IMPLANT
CLIP VESOCCLUDE SM WIDE 24/CT (CLIP) ×2 IMPLANT
CUFF TOURNIQUET SINGLE 24IN (TOURNIQUET CUFF) IMPLANT
CUFF TOURNIQUET SINGLE 34IN LL (TOURNIQUET CUFF) IMPLANT
CUFF TOURNIQUET SINGLE 44IN (TOURNIQUET CUFF) IMPLANT
DERMABOND ADVANCED (GAUZE/BANDAGES/DRESSINGS) ×1
DERMABOND ADVANCED .7 DNX12 (GAUZE/BANDAGES/DRESSINGS) ×1 IMPLANT
DRAIN SNY WOU (WOUND CARE) IMPLANT
DRAPE HALF SHEET 40X57 (DRAPES) IMPLANT
DRAPE X-RAY CASS 24X20 (DRAPES) IMPLANT
ELECT REM PT RETURN 9FT ADLT (ELECTROSURGICAL) ×2
ELECTRODE REM PT RTRN 9FT ADLT (ELECTROSURGICAL) ×1 IMPLANT
EVACUATOR SILICONE 100CC (DRAIN) IMPLANT
GAUZE SPONGE 4X4 12PLY STRL (GAUZE/BANDAGES/DRESSINGS) ×1 IMPLANT
GLOVE BIO SURGEON STRL SZ7.5 (GLOVE) ×2 IMPLANT
GOWN STRL REUS W/ TWL LRG LVL3 (GOWN DISPOSABLE) ×3 IMPLANT
GOWN STRL REUS W/TWL LRG LVL3 (GOWN DISPOSABLE) ×6
HEMOSTAT SPONGE AVITENE ULTRA (HEMOSTASIS) IMPLANT
KIT BASIN OR (CUSTOM PROCEDURE TRAY) ×2 IMPLANT
KIT ROOM TURNOVER OR (KITS) ×2 IMPLANT
NS IRRIG 1000ML POUR BTL (IV SOLUTION) ×4 IMPLANT
PACK PERIPHERAL VASCULAR (CUSTOM PROCEDURE TRAY) ×2 IMPLANT
PAD ARMBOARD 7.5X6 YLW CONV (MISCELLANEOUS) ×4 IMPLANT
SET COLLECT BLD 21X3/4 12 (NEEDLE) IMPLANT
STOPCOCK 4 WAY LG BORE MALE ST (IV SETS) IMPLANT
SUT PROLENE 5 0 C 1 24 (SUTURE) ×2 IMPLANT
SUT PROLENE 6 0 CC (SUTURE) ×2 IMPLANT
SUT PROLENE 7 0 BV 1 (SUTURE) IMPLANT
SUT PROLENE 7 0 BV1 MDA (SUTURE) IMPLANT
SUT SILK 2 0 PERMA HAND 18 BK (SUTURE) ×2 IMPLANT
SUT SILK 2 0 SH (SUTURE) ×2 IMPLANT
SUT SILK 3 0 (SUTURE)
SUT SILK 3-0 18XBRD TIE 12 (SUTURE) IMPLANT
SUT VIC AB 2-0 SH 27 (SUTURE) ×4
SUT VIC AB 2-0 SH 27XBRD (SUTURE) ×2 IMPLANT
SUT VIC AB 3-0 SH 27 (SUTURE) ×8
SUT VIC AB 3-0 SH 27X BRD (SUTURE) ×4 IMPLANT
SUT VICRYL 4-0 PS2 18IN ABS (SUTURE) ×4 IMPLANT
TAPE UMBILICAL COTTON 1/8X30 (MISCELLANEOUS) IMPLANT
TOWEL GREEN STERILE (TOWEL DISPOSABLE) ×2 IMPLANT
TRAY FOLEY MTR SLVR 16FR STAT (CATHETERS) ×2 IMPLANT
TUBING EXTENTION W/L.L. (IV SETS) IMPLANT
UNDERPAD 30X30 (UNDERPADS AND DIAPERS) ×2 IMPLANT
WATER STERILE IRR 1000ML POUR (IV SOLUTION) ×2 IMPLANT

## 2017-03-17 NOTE — ED Notes (Signed)
Consent for surgical procedure obtained, signed by pt, RN, and MD and placed bedside

## 2017-03-17 NOTE — Transfer of Care (Signed)
Immediate Anesthesia Transfer of Care Note  Patient: [name]  Procedure(s) Performed: INCISION AND DRAINAGE OF RIGHT LEG (Right )  Patient Location: PACU  Anesthesia Type:General  Level of Consciousness: awake, alert  and oriented  Airway & Oxygen Therapy: Patient Spontanous Breathing and Patient connected to nasal cannula oxygen  Post-op Assessment: Report given to RN and Post -op Vital signs reviewed and stable  Post vital signs: Reviewed and stable  Last Vitals:  Vitals:   03/17/17 1430 03/17/17 1445  BP: (!) 163/75 (!) 145/72  Pulse: 82 74  SpO2: 100% 99%    Last Pain:  Vitals:   03/17/17 0734  PainSc: 8          Complications: No apparent anesthesia complications

## 2017-03-17 NOTE — ED Notes (Signed)
Pt. To CT via stretcher. 

## 2017-03-17 NOTE — Progress Notes (Signed)
Pharmacy Antibiotic Note  [name] is a 57 y.o. male admitted on 03/17/2017 with cellulitis with abscess from IVDA.  Pharmacy has been consulted for vancomycin and Zosyn dosing. Hx of 2 fem-pop bypasses, R pedal pulse present. CrCl ~29mL/min  Plan: Vancomycin 1500mg  IV once (already received), then 1000mg  IV q8h for goal trough 15-20 mcg/mL. Zosyn 3.375g IV q8h (4 hour infusion). Monitor clinical progression and LOT  Height: 6\' 1"  (185.4 cm) Weight: 180 lb (81.6 kg) IBW/kg (Calculated) : 79.9  Temp (24hrs), Avg:97.5 F (36.4 C), Min:97.3 F (36.3 C), Max:97.7 F (36.5 C)  Recent Labs  Lab 03/17/17 0746 03/17/17 0750 03/17/17 0901  WBC 8.4  --   --   CREATININE  --  1.12  --   LATICACIDVEN  --   --  0.73    Estimated Creatinine Clearance: 81.2 mL/min (by C-G formula based on SCr of 1.12 mg/dL).    Allergies  Allergen Reactions  . Buprenorphine Hcl-Naloxone Hcl Shortness Of Breath and Other (See Comments)    "feel like going to die" jittery, trouble breathing, feels hot (reaction to Suboxone)  . Ciprofloxacin Anaphylaxis  . Embeda [Morphine-Naltrexone] Other (See Comments)    seizures  . Shellfish-Derived Products Anaphylaxis, Hives and Swelling  . Fish Allergy Hives, Swelling and Rash  . Morphine And Related     UNKNOWN  . Benadryl [Diphenhydramine Hcl] Hives, Itching and Rash  . Diphenhydramine Hcl Hives, Itching and Rash  . Vicodin [Hydrocodone-Acetaminophen] Other (See Comments)    Nausea only   Vanc 3/6>> Zosyn 3/6>>  3/6 abscess:   Thank you for allowing pharmacy to be a part of this patient's care.  [name] D. Khaden Gater, PharmD, [place] Clinical Pharmacist [phone] 03/17/2017 7:04 PM

## 2017-03-17 NOTE — ED Triage Notes (Signed)
Pt in via [facility], from [facility] with R anterior thigh abcess. Began Saturday, started as two small bumps per pt. Hx of 2 fem-pop bypasses, R pedal pulse present. Hx of Heroin use. Area 3in in diameter, warm red and tender. Denies fevers, temp 97.8

## 2017-03-17 NOTE — Anesthesia Postprocedure Evaluation (Signed)
Anesthesia Post Note  Patient: [name]  Procedure(s) Performed: INCISION AND DRAINAGE OF RIGHT LEG (Right )     Patient location during evaluation: PACU Anesthesia Type: General Level of consciousness: awake and alert Pain management: pain level controlled Vital Signs Assessment: post-procedure vital signs reviewed and stable Respiratory status: spontaneous breathing, nonlabored ventilation, respiratory function stable and patient connected to nasal cannula oxygen Cardiovascular status: blood pressure returned to baseline and stable Postop Assessment: no apparent nausea or vomiting Anesthetic complications: no    Last Vitals:  Vitals:   03/17/17 1822 03/17/17 1830  BP: 125/85   Pulse:    Resp: 17   Temp:  (!) 36.3 C  SpO2:      Last Pain:  Vitals:   03/17/17 1815  PainSc: 9                  Myrla Malanowski COKER

## 2017-03-17 NOTE — ED Provider Notes (Signed)
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 for abscess.  Review of records show that patient has had to have I&D in the OR of right lower extremity wounds previously.  Given significant size, symptoms, will plan for CT of right lower extremity for evaluation of extent of abscess.  We will plan to check basic labs.  Fluids, analgesics given in the department.  Given concern for cellulitis, will start patient on broad-spectrum antibiotics.  Lactic acid negative.  CMP is unremarkable.  CBC without significant leukocytosis.  I discussed with radiologist regarding CT.  There is concerned about a  thrombosed femoropopliteal graft but does state that this may be chronic in nature.  Additionally, the abscess is concerning for extension around the femoral artery.  We will plan to consult vascular.  Discussed with vascular.  They would like CT abdomen injury with lower extremity runoff.  We will plan to consult patient in the ED.  Discussed patient with Dr. Oneida Alar (Vascular). Plan for vascular admission with plans for I&D in the OR today. Patient updated on plan.   Final Clinical Impressions(s) / ED Diagnoses   Final diagnoses:  Abscess of right lower  extremity    ED Discharge Orders    None       Desma Mcgregor 03/17/17 1557    Daleen Bo, MD 03/17/17 2222

## 2017-03-17 NOTE — ED Notes (Signed)
Patient transported to CT 

## 2017-03-17 NOTE — Progress Notes (Signed)
Pharmacy Antibiotic Note  [name] is a 57 y.o. male admitted on 03/17/2017 with cellulitis.  Pharmacy has been consulted for vancomycin and Zosyn dosing. Hx of 2 fem-pop bypasses, R pedal pulse present. Hx of Heroin use. CrCl ~57mL/min  Plan: Vancomycin 1500mg  IV once then 1000mg  IV every 12 hours.  Goal trough 10-15 mcg/mL. Zosyn 3.375g IV q8h (4 hour infusion). Monitor clinical progression and LOT  Weight: 180 lb (81.6 kg)  No data recorded.  No results for input(s): WBC, CREATININE, LATICACIDVEN, VANCOTROUGH, VANCOPEAK, VANCORANDOM, GENTTROUGH, GENTPEAK, GENTRANDOM, TOBRATROUGH, TOBRAPEAK, TOBRARND, AMIKACINPEAK, AMIKACINTROU, AMIKACIN in the last 168 hours.  CrCl cannot be calculated (Unknown ideal weight.).    Allergies  Allergen Reactions  . Buprenorphine Hcl-Naloxone Hcl Shortness Of Breath and Other (See Comments)    "feel like going to die" jittery, trouble breathing, feels hot (reaction to Suboxone)  . Ciprofloxacin Anaphylaxis  . Embeda [Morphine-Naltrexone] Other (See Comments)    seizures  . Shellfish-Derived Products Anaphylaxis, Hives and Swelling  . Fish Allergy Hives, Swelling and Rash  . Morphine And Related     UNKNOWN  . Benadryl [Diphenhydramine Hcl] Hives, Itching and Rash  . Diphenhydramine Hcl Hives, Itching and Rash  . Vicodin [Hydrocodone-Acetaminophen] Other (See Comments)    Nausea only    Thank you for allowing pharmacy to be a part of this patient's care.  [name] [name] 03/17/2017 8:43 AM

## 2017-03-17 NOTE — Op Note (Signed)
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  No evidence of graft erosion  Cultures anaerobic aerobic sent  Operative details: After obtaining informed consent, the patient was taken the operating room.  The patient was placed in supine position operating table.  After induction of general anesthesia and endotracheal intubation, the patient's entire right lower extremity was prepped and draped in usual sterile fashion.  A longitudinal incision was made just lateral to a pre-existing anterior tibial artery bypass graft on the lateral aspect of the thigh.  I made the incision lateral to the bypass graft in order to try to maintain skin coverage of the graft.  Upon entering the subcutaneous tissues a large amount of yellowish mucopurulent and bloody drainage was evacuated.  Aerobic and anaerobic cultures were sent.  The wound was thoroughly irrigated with normal saline solution.  A moistened saline gauze dressing was applied.  The patient tolerated the procedure well and there were no complications.  The instrument sponge needle count was correct at the end of the case.  The patient was taken the recovery room in stable condition.  Ruta Hinds, MD [facility] [place] Office: [phone] Pager: [phone]
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
## 2017-03-17 NOTE — Anesthesia Procedure Notes (Signed)
Procedure Name: Intubation Date/Time: 03/17/2017 5:00 PM Performed by: Teressa Lower., CRNA Pre-anesthesia Checklist: Patient identified, Emergency Drugs available, Suction available and Patient being monitored Patient Re-evaluated:Patient Re-evaluated prior to induction Oxygen Delivery Method: Circle system utilized Preoxygenation: Pre-oxygenation with 100% oxygen Induction Type: IV induction Ventilation: Mask ventilation without difficulty and Oral airway inserted - appropriate to patient size Laryngoscope Size: Mac and 3 Grade View: Grade II Tube type: Oral Tube size: 7.5 mm Number of attempts: 1 Airway Equipment and Method: Stylet and Oral airway Placement Confirmation: ETT inserted through vocal cords under direct vision,  positive ETCO2 and breath sounds checked- equal and bilateral Secured at: 21 cm Tube secured with: Tape Dental Injury: Teeth and Oropharynx as per pre-operative assessment

## 2017-03-17 NOTE — Anesthesia Preprocedure Evaluation (Addendum)
Anesthesia Evaluation  Patient identified by MRN, date of birth, ID band Patient awake    Reviewed: Allergy & Precautions, NPO status , Patient's Chart, lab work & pertinent test results  Airway Mallampati: II   Neck ROM: Full    Dental  (+) Dental Advisory Given, Poor Dentition, Edentulous Upper   Pulmonary asthma , sleep apnea , COPD, Current Smoker, PE Hx TB s/p treatment 1990   breath sounds clear to auscultation       Cardiovascular hypertension, + Peripheral Vascular Disease and +CHF  + Valvular Problems/Murmurs  Rhythm:Regular Rate:Normal  '16 TTE - EF 96-22%, grade 1 diastolic dysfx, moderate TR  '16 Carotid US - Occluded left carotid, 1-39% right ICAS   Neuro/Psych Seizures -,  Anxiety Depression CVA    GI/Hepatic hiatal hernia, GERD  ,(+)     substance abuse  IV drug use, Hepatitis -, C  Endo/Other  diabetes, Type 2  Renal/GU negative Renal ROS  negative genitourinary   Musculoskeletal  (+) Arthritis , Rheumatoid disorders,    Abdominal   Peds  Hematology negative hematology ROS (+)   Anesthesia Other Findings   Reproductive/Obstetrics                             Anesthesia Physical  Anesthesia Plan  ASA: III  Anesthesia Plan: General   Post-op Pain Management:    Induction: Intravenous  PONV Risk Score and Plan:   Airway Management Planned: Oral ETT  Additional Equipment:   Intra-op Plan:   Post-operative Plan: Extubation in OR  Informed Consent: I have reviewed the patients History and Physical, chart, labs and discussed the procedure including the risks, benefits and alternatives for the proposed anesthesia with the patient or authorized representative who has indicated his/her understanding and acceptance.   Dental advisory given  Plan Discussed with: CRNA  Anesthesia Plan Comments:                                         Anesthesia Evaluation   Patient identified by MRN, date of birth, ID band Patient awake    Reviewed: Allergy & Precautions, NPO status , Patient's Chart, lab work & pertinent test results  Airway Mallampati: II  TM Distance: >3 FB Neck ROM: Full    Dental  (+) Edentulous Upper, Poor Dentition   Pulmonary Current Smoker,  breath sounds clear to auscultation        Cardiovascular hypertension, Rhythm:Regular Rate:Normal     Neuro/Psych    GI/Hepatic   Endo/Other  diabetes  Renal/GU      Musculoskeletal   Abdominal   Peds  Hematology   Anesthesia Other Findings   Reproductive/Obstetrics                            Anesthesia Physical Anesthesia Plan  ASA: III  Anesthesia Plan: General   Post-op Pain Management:    Induction: Intravenous  Airway Management Planned: Oral ETT  Additional Equipment:   Intra-op Plan:   Post-operative Plan: Extubation in OR  Informed Consent: I have reviewed the patients History and Physical, chart, labs and discussed the procedure including the risks, benefits and alternatives for the proposed anesthesia with the patient or authorized representative who has indicated his/her understanding and acceptance.     Plan Discussed with: CRNA and  Anesthesiologist  Anesthesia Plan Comments:         Anesthesia Quick Evaluation                                   Anesthesia Evaluation  Patient identified by MRN, date of birth, ID band Patient awake    Reviewed: Allergy & Precautions, H&P , NPO status , Patient's Chart, lab work & pertinent test results  Airway Mallampati: II  TM Distance: >3 FB Neck ROM: Full    Dental   Pulmonary shortness of breath, sleep apnea , COPDCurrent Smoker,          Cardiovascular hypertension, Pt. on medications + Peripheral Vascular Disease     Neuro/Psych    GI/Hepatic GERD-  Medicated and Controlled,(+) Hepatitis -, C  Endo/Other  diabetes, Type 2,  Insulin Dependent  Renal/GU      Musculoskeletal   Abdominal   Peds  Hematology   Anesthesia Other Findings   Reproductive/Obstetrics                             Anesthesia Physical Anesthesia Plan  ASA: IV  Anesthesia Plan: General ETT   Post-op Pain Management:    Induction:   Airway Management Planned:   Additional Equipment:   Intra-op Plan:   Post-operative Plan:   Informed Consent:   Plan Discussed with:   Anesthesia Plan Comments:         Anesthesia Quick Evaluation  Anesthesia Quick Evaluation

## 2017-03-17 NOTE — ED Notes (Addendum)
Pt requesting pain medication and nicotine patch, PA aware, orders received

## 2017-03-17 NOTE — ED Provider Notes (Signed)
  Face-to-face evaluation   History: He complains of a swollen area at the site where he typically injects heroin for several days.  No nausea, vomiting, fever or chills.  Physical exam: Right lower anterolateral thigh with fluctuant mass with surrounding erythema.  Moderate tenderness associated.  No significant proximal streaking.  Neurovascular intact distally in the right foot.  Medical screening examination/treatment/procedure(s) were conducted as a shared visit with non-physician practitioner(s) and myself.  I personally evaluated the patient during the encounter    Daleen Bo, MD 03/17/17 2223

## 2017-03-18 ENCOUNTER — Encounter (HOSPITAL_COMMUNITY): Payer: Self-pay | Admitting: Vascular Surgery

## 2017-03-18 LAB — CBC
HEMATOCRIT: 40 % (ref 39.0–52.0)
HEMOGLOBIN: 13.2 g/dL (ref 13.0–17.0)
MCH: 30.1 pg (ref 26.0–34.0)
MCHC: 33 g/dL (ref 30.0–36.0)
MCV: 91.3 fL (ref 78.0–100.0)
Platelets: 283 10*3/uL (ref 150–400)
RBC: 4.38 MIL/uL (ref 4.22–5.81)
RDW: 13.4 % (ref 11.5–15.5)
WBC: 8.6 10*3/uL (ref 4.0–10.5)

## 2017-03-18 LAB — MRSA PCR SCREENING: MRSA BY PCR: NEGATIVE

## 2017-03-18 LAB — HIV ANTIBODY (ROUTINE TESTING W REFLEX): HIV SCREEN 4TH GENERATION: NONREACTIVE

## 2017-03-18 NOTE — Progress Notes (Signed)
Pt's pain scale has been severe between 8 and 10 throughout shift. PRN Dilaudid and Oxycodone was given repeatedly. However, pt still complains of pain and frequently has asked for more pain medication for generalized pain and incision. As I approach him, pt is found deep asleep with no apparent distress. However, as he hears me in the room, he begins to [place] and ask for more pain medication. VSS throughout shift. Requesting for pain medication upon discharge. Will continue to monitor.   Fransico Michael, RN

## 2017-03-18 NOTE — Progress Notes (Addendum)
  Progress Note    03/18/2017 7:52 AM 1 Day Post-Op  Subjective:  Pain to incision site R distal, lateral thigh   Vitals:   03/17/17 2349 03/18/17 0311  BP:    Pulse: 83 78  Resp:    Temp: 98.7 F (37.1 C) 98.1 F (36.7 C)  SpO2: 98%    Physical Exam: Lungs:  Non labored Extremities:  Erythema improved; palpable R DP; clean wound bed, no purulence Abdomen:  Soft Neurologic: A&O  CBC    Component Value Date/Time   WBC 8.6 03/18/2017 0325   RBC 4.38 03/18/2017 0325   HGB 13.2 03/18/2017 0325   HCT 40.0 03/18/2017 0325   PLT 283 03/18/2017 0325   MCV 91.3 03/18/2017 0325   MCH 30.1 03/18/2017 0325   MCHC 33.0 03/18/2017 0325   RDW 13.4 03/18/2017 0325   LYMPHSABS 1.8 03/17/2017 0746   MONOABS 0.7 03/17/2017 0746   EOSABS 0.4 03/17/2017 0746   BASOSABS 0.0 03/17/2017 0746    BMET    Component Value Date/Time   NA 134 (L) 03/17/2017 0750   K 4.6 03/17/2017 0750   CL 100 (L) 03/17/2017 0750   CO2 25 03/17/2017 0750   GLUCOSE 122 (H) 03/17/2017 0750   BUN 9 03/17/2017 0750   CREATININE 1.12 03/17/2017 0750   CREATININE 0.85 05/10/2014 1114   CALCIUM 8.5 (L) 03/17/2017 0750   CALCIUM 9.1 10/28/2009 2118   GFRNONAA >60 03/17/2017 0750   GFRNONAA >89 02/01/2014 1518   GFRAA >60 03/17/2017 0750   GFRAA >89 02/01/2014 1518    INR    Component Value Date/Time   INR 1.13 03/17/2017 0802     Intake/Output Summary (Last 24 hours) at 03/18/2017 0752 Last data filed at 03/17/2017 2302 Gross per 24 hour  Intake 1870 ml  Output 10 ml  Net 1860 ml     Assessment/Plan:  57 y.o. male is s/p Incision and drainage of right thigh abscess  1 Day Post-Op   Continue IV antibiotics Dressing changed this morning; BID wet to dry packing of R thigh Bypass remains patent given palpable R pedal pulses   Dagoberto Ligas, PA-C [facility] [phone] 03/18/2017 7:52 AM  History and exam findings as above.  Start local wound care today.  Follow-up  cultures.  Dr. Donnetta Hutching will assume patient's care tomorrow.  Ruta Hinds, MD [facility] [place] Office: [phone] Pager: [phone]

## 2017-03-19 ENCOUNTER — Encounter (HOSPITAL_COMMUNITY): Payer: Self-pay | Admitting: Certified Registered"

## 2017-03-19 ENCOUNTER — Inpatient Hospital Stay (HOSPITAL_COMMUNITY): Payer: Medicaid Other | Admitting: Certified Registered"

## 2017-03-19 ENCOUNTER — Encounter (HOSPITAL_COMMUNITY)
Admission: EM | Disposition: A | Discharge status: Home / Self Care | Payer: Self-pay | Source: Home / Self Care | Attending: Vascular Surgery

## 2017-03-19 HISTORY — PX: FEMORAL-TIBIAL BYPASS GRAFT: SHX938

## 2017-03-19 LAB — GLUCOSE, CAPILLARY: GLUCOSE-CAPILLARY: 114 mg/dL — AB (ref 65–99)

## 2017-03-19 LAB — CBC
HCT: 36.6 % — ABNORMAL LOW (ref 39.0–52.0)
Hemoglobin: 11.6 g/dL — ABNORMAL LOW (ref 13.0–17.0)
MCH: 29.8 pg (ref 26.0–34.0)
MCHC: 31.7 g/dL (ref 30.0–36.0)
MCV: 94.1 fL (ref 78.0–100.0)
PLATELETS: 293 10*3/uL (ref 150–400)
RBC: 3.89 MIL/uL — ABNORMAL LOW (ref 4.22–5.81)
RDW: 13.6 % (ref 11.5–15.5)
WBC: 6.9 10*3/uL (ref 4.0–10.5)

## 2017-03-19 LAB — BASIC METABOLIC PANEL
Anion gap: 7 (ref 5–15)
BUN: 9 mg/dL (ref 6–20)
CALCIUM: 8.5 mg/dL — AB (ref 8.9–10.3)
CO2: 28 mmol/L (ref 22–32)
CREATININE: 0.96 mg/dL (ref 0.61–1.24)
Chloride: 103 mmol/L (ref 101–111)
Glucose, Bld: 117 mg/dL — ABNORMAL HIGH (ref 65–99)
Potassium: 4.2 mmol/L (ref 3.5–5.1)
SODIUM: 138 mmol/L (ref 135–145)

## 2017-03-19 SURGERY — CREATION, BYPASS, ARTERIAL, FEMORAL TO TIBIAL, USING GRAFT
Anesthesia: General | Site: Leg Lower | Laterality: Right

## 2017-03-19 MED ORDER — FENTANYL CITRATE (PF) 250 MCG/5ML IJ SOLN
INTRAMUSCULAR | Status: AC
  Filled 2017-03-19: qty 5

## 2017-03-19 MED ORDER — HYDROMORPHONE HCL 1 MG/ML IJ SOLN
INTRAMUSCULAR | Status: AC
  Administered 2017-03-19: 0.5 mg via INTRAVENOUS
  Filled 2017-03-19: qty 1

## 2017-03-19 MED ORDER — 0.9 % SODIUM CHLORIDE (POUR BTL) OPTIME
TOPICAL | Status: DC | PRN
  Administered 2017-03-19: 2000 mL

## 2017-03-19 MED ORDER — HYDROMORPHONE HCL 1 MG/ML IJ SOLN
0.2500 mg | INTRAMUSCULAR | Status: DC | PRN
  Administered 2017-03-19 (×2): 0.5 mg via INTRAVENOUS

## 2017-03-19 MED ORDER — LIDOCAINE HCL (CARDIAC) 20 MG/ML IV SOLN
INTRAVENOUS | Status: DC | PRN
  Administered 2017-03-19: 100 mg via INTRAVENOUS

## 2017-03-19 MED ORDER — LACTATED RINGERS IV SOLN
INTRAVENOUS | Status: DC | PRN
  Administered 2017-03-19: 09:00:00 via INTRAVENOUS

## 2017-03-19 MED ORDER — PHENYLEPHRINE HCL 10 MG/ML IJ SOLN
INTRAVENOUS | Status: DC | PRN
  Administered 2017-03-19: 50 ug/min via INTRAVENOUS

## 2017-03-19 MED ORDER — FENTANYL CITRATE (PF) 100 MCG/2ML IJ SOLN
INTRAMUSCULAR | Status: DC | PRN
  Administered 2017-03-19 (×3): 100 ug via INTRAVENOUS
  Administered 2017-03-19: 50 ug via INTRAVENOUS
  Administered 2017-03-19: 150 ug via INTRAVENOUS

## 2017-03-19 MED ORDER — MIDAZOLAM HCL 2 MG/2ML IJ SOLN
INTRAMUSCULAR | Status: AC
  Filled 2017-03-19: qty 2

## 2017-03-19 MED ORDER — LIDOCAINE HCL (CARDIAC) 20 MG/ML IV SOLN
INTRAVENOUS | Status: AC
  Filled 2017-03-19: qty 5

## 2017-03-19 MED ORDER — HYDROMORPHONE HCL 1 MG/ML IJ SOLN
INTRAMUSCULAR | Status: DC | PRN
  Administered 2017-03-19: 1 mg via INTRAVENOUS

## 2017-03-19 MED ORDER — SUCCINYLCHOLINE CHLORIDE 200 MG/10ML IV SOSY
PREFILLED_SYRINGE | INTRAVENOUS | Status: AC
  Filled 2017-03-19: qty 10

## 2017-03-19 MED ORDER — SUGAMMADEX SODIUM 500 MG/5ML IV SOLN
INTRAVENOUS | Status: DC | PRN
  Administered 2017-03-19: 300 mg via INTRAVENOUS

## 2017-03-19 MED ORDER — PROPOFOL 10 MG/ML IV BOLUS
INTRAVENOUS | Status: DC | PRN
  Administered 2017-03-19: 120 mg via INTRAVENOUS

## 2017-03-19 MED ORDER — HYDROMORPHONE HCL 1 MG/ML IJ SOLN
INTRAMUSCULAR | Status: AC
  Filled 2017-03-19: qty 1

## 2017-03-19 MED ORDER — ROCURONIUM BROMIDE 100 MG/10ML IV SOLN
INTRAVENOUS | Status: DC | PRN
  Administered 2017-03-19: 40 mg via INTRAVENOUS

## 2017-03-19 MED ORDER — PROPOFOL 10 MG/ML IV BOLUS
INTRAVENOUS | Status: AC
  Filled 2017-03-19: qty 20

## 2017-03-19 MED ORDER — ONDANSETRON HCL 4 MG/2ML IJ SOLN
INTRAMUSCULAR | Status: DC | PRN
  Administered 2017-03-19: 4 mg via INTRAVENOUS

## 2017-03-19 MED ORDER — ONDANSETRON HCL 4 MG/2ML IJ SOLN
INTRAMUSCULAR | Status: AC
  Filled 2017-03-19: qty 2

## 2017-03-19 MED ORDER — SODIUM CHLORIDE 0.9 % IV SOLN
INTRAVENOUS | Status: DC | PRN
  Administered 2017-03-19: 10:00:00 500 mL

## 2017-03-19 MED ORDER — PROMETHAZINE HCL 25 MG/ML IJ SOLN: 6.2500 mg | INTRAMUSCULAR | Status: DC | PRN

## 2017-03-19 MED ORDER — SUGAMMADEX SODIUM 500 MG/5ML IV SOLN
INTRAVENOUS | Status: AC
Start: 2017-03-19 — End: ?
  Filled 2017-03-19: qty 5

## 2017-03-19 MED ORDER — NICOTINE 21 MG/24HR TD PT24
21.0000 mg | MEDICATED_PATCH | Freq: Every day | TRANSDERMAL | Status: DC
  Administered 2017-03-20 – 2017-03-21 (×2): 21 mg via TRANSDERMAL
  Filled 2017-03-19 (×2): qty 1

## 2017-03-19 MED ORDER — SUCCINYLCHOLINE CHLORIDE 20 MG/ML IJ SOLN
INTRAMUSCULAR | Status: DC | PRN
  Administered 2017-03-19: 100 mg via INTRAVENOUS

## 2017-03-19 MED ORDER — ROCURONIUM BROMIDE 10 MG/ML (PF) SYRINGE
PREFILLED_SYRINGE | INTRAVENOUS | Status: AC
  Filled 2017-03-19: qty 5

## 2017-03-19 SURGICAL SUPPLY — 53 items
ADH SKN CLS APL DERMABOND .7 (GAUZE/BANDAGES/DRESSINGS) ×1
AGENT HMST SPONGE THK3/8 (HEMOSTASIS)
BANDAGE ESMARK 6X9 LF (GAUZE/BANDAGES/DRESSINGS) IMPLANT
BNDG CMPR 9X6 STRL LF SNTH (GAUZE/BANDAGES/DRESSINGS)
BNDG ESMARK 6X9 LF (GAUZE/BANDAGES/DRESSINGS)
CANISTER SUCT 3000ML PPV (MISCELLANEOUS) ×2 IMPLANT
CANNULA VESSEL 3MM 2 BLNT TIP (CANNULA) ×4 IMPLANT
CLIP VESOCCLUDE MED 24/CT (CLIP) ×2 IMPLANT
CLIP VESOCCLUDE SM WIDE 24/CT (CLIP) ×2 IMPLANT
CUFF TOURNIQUET SINGLE 24IN (TOURNIQUET CUFF) IMPLANT
CUFF TOURNIQUET SINGLE 34IN LL (TOURNIQUET CUFF) IMPLANT
CUFF TOURNIQUET SINGLE 44IN (TOURNIQUET CUFF) IMPLANT
DERMABOND ADVANCED (GAUZE/BANDAGES/DRESSINGS) ×1
DERMABOND ADVANCED .7 DNX12 (GAUZE/BANDAGES/DRESSINGS) ×1 IMPLANT
DRAIN SNY WOU (WOUND CARE) IMPLANT
DRAPE HALF SHEET 40X57 (DRAPES) IMPLANT
DRAPE X-RAY CASS 24X20 (DRAPES) IMPLANT
ELECT REM PT RETURN 9FT ADLT (ELECTROSURGICAL) ×2
ELECTRODE REM PT RTRN 9FT ADLT (ELECTROSURGICAL) ×1 IMPLANT
EVACUATOR SILICONE 100CC (DRAIN) IMPLANT
GAUZE SPONGE 4X4 12PLY STRL LF (GAUZE/BANDAGES/DRESSINGS) ×1 IMPLANT
GLOVE BIO SURGEON STRL SZ7.5 (GLOVE) ×2 IMPLANT
GOWN STRL REUS W/ TWL LRG LVL3 (GOWN DISPOSABLE) ×3 IMPLANT
GOWN STRL REUS W/TWL LRG LVL3 (GOWN DISPOSABLE) ×6
HEMOSTAT SPONGE AVITENE ULTRA (HEMOSTASIS) IMPLANT
KIT BASIN OR (CUSTOM PROCEDURE TRAY) ×2 IMPLANT
KIT ROOM TURNOVER OR (KITS) ×2 IMPLANT
NS IRRIG 1000ML POUR BTL (IV SOLUTION) ×4 IMPLANT
PACK PERIPHERAL VASCULAR (CUSTOM PROCEDURE TRAY) ×2 IMPLANT
PAD ARMBOARD 7.5X6 YLW CONV (MISCELLANEOUS) ×4 IMPLANT
SET COLLECT BLD 21X3/4 12 (NEEDLE) IMPLANT
STOPCOCK 4 WAY LG BORE MALE ST (IV SETS) IMPLANT
SUT ETHILON 2 0 PSLX (SUTURE) ×2 IMPLANT
SUT PROLENE 5 0 C 1 24 (SUTURE) ×2 IMPLANT
SUT PROLENE 6 0 CC (SUTURE) ×2 IMPLANT
SUT PROLENE 7 0 BV 1 (SUTURE) IMPLANT
SUT PROLENE 7 0 BV1 MDA (SUTURE) IMPLANT
SUT SILK 2 0 PERMA HAND 18 BK (SUTURE) ×2 IMPLANT
SUT SILK 2 0 SH (SUTURE) ×2 IMPLANT
SUT SILK 3 0 (SUTURE)
SUT SILK 3-0 18XBRD TIE 12 (SUTURE) IMPLANT
SUT VIC AB 2-0 SH 27 (SUTURE) ×4
SUT VIC AB 2-0 SH 27XBRD (SUTURE) ×2 IMPLANT
SUT VIC AB 3-0 SH 27 (SUTURE) ×8
SUT VIC AB 3-0 SH 27X BRD (SUTURE) ×4 IMPLANT
SUT VICRYL 4-0 PS2 18IN ABS (SUTURE) ×4 IMPLANT
TAPE CLOTH SURG 4X10 WHT LF (GAUZE/BANDAGES/DRESSINGS) ×1 IMPLANT
TAPE UMBILICAL COTTON 1/8X30 (MISCELLANEOUS) IMPLANT
TOWEL GREEN STERILE (TOWEL DISPOSABLE) ×2 IMPLANT
TRAY FOLEY MTR SLVR 16FR STAT (CATHETERS) ×2 IMPLANT
TUBING EXTENTION W/L.L. (IV SETS) IMPLANT
UNDERPAD 30X30 (UNDERPADS AND DIAPERS) ×2 IMPLANT
WATER STERILE IRR 1000ML POUR (IV SOLUTION) ×2 IMPLANT

## 2017-03-19 NOTE — Anesthesia Procedure Notes (Signed)
Procedure Name: Intubation Date/Time: 03/19/2017 8:59 AM Performed by: Babs Bertin, CRNA Pre-anesthesia Checklist: Patient identified, Emergency Drugs available, Suction available and Patient being monitored Patient Re-evaluated:Patient Re-evaluated prior to induction Oxygen Delivery Method: Circle System Utilized Preoxygenation: Pre-oxygenation with 100% oxygen Induction Type: IV induction Ventilation: Mask ventilation without difficulty Laryngoscope Size: Mac and 3 Grade View: Grade II Tube type: Oral Tube size: 7.5 mm Number of attempts: 1 Airway Equipment and Method: Stylet and Oral airway Placement Confirmation: ETT inserted through vocal cords under direct vision,  positive ETCO2 and breath sounds checked- equal and bilateral Secured at: 22 cm Tube secured with: Tape Dental Injury: Teeth and Oropharynx as per pre-operative assessment

## 2017-03-19 NOTE — Anesthesia Procedure Notes (Signed)
Arterial Line Insertion Start/End3/08/2017 8:55 AM, 03/19/2017 8:59 AM Performed by: Duane Boston, MD, Verdie Drown, CRNA, CRNA  Patient location: OR. Preanesthetic checklist: patient identified, IV checked, monitors and equipment checked and pre-op evaluation Left, radial was placed Catheter size: 20 G Hand hygiene performed  and maximum sterile barriers used   Attempts: 1 Procedure performed without using ultrasound guided technique. Ultrasound Notes:anatomy identified Following insertion, dressing applied and Biopatch.

## 2017-03-19 NOTE — Anesthesia Preprocedure Evaluation (Signed)
Anesthesia Evaluation  Patient identified by MRN, date of birth, ID band Patient awake    Reviewed: Allergy & Precautions, NPO status , Patient's Chart, lab work & pertinent test results  Airway Mallampati: II   Neck ROM: Full    Dental  (+) Dental Advisory Given, Poor Dentition, Edentulous Upper   Pulmonary asthma , sleep apnea , COPD, Current Smoker, PE Hx TB s/p treatment 1990   breath sounds clear to auscultation       Cardiovascular hypertension, + Peripheral Vascular Disease and +CHF  + Valvular Problems/Murmurs  Rhythm:Regular Rate:Normal  '16 TTE - EF 10-17%, grade 1 diastolic dysfx, moderate TR  '16 Carotid US - Occluded left carotid, 1-39% right ICAS   Neuro/Psych Seizures -,  Anxiety Depression CVA    GI/Hepatic hiatal hernia, GERD  ,(+)     substance abuse  IV drug use, Hepatitis -, C  Endo/Other  diabetes, Type 2  Renal/GU negative Renal ROS  negative genitourinary   Musculoskeletal  (+) Arthritis , Rheumatoid disorders,    Abdominal   Peds  Hematology negative hematology ROS (+)   Anesthesia Other Findings   Reproductive/Obstetrics                             Anesthesia Physical  Anesthesia Plan  ASA: III and emergent  Anesthesia Plan: General   Post-op Pain Management:    Induction: Intravenous, Rapid sequence and Cricoid pressure planned  PONV Risk Score and Plan: 2 and Ondansetron and Dexamethasone  Airway Management Planned: Oral ETT  Additional Equipment:   Intra-op Plan:   Post-operative Plan: Extubation in OR  Informed Consent: I have reviewed the patients History and Physical, chart, labs and discussed the procedure including the risks, benefits and alternatives for the proposed anesthesia with the patient or authorized representative who has indicated his/her understanding and acceptance.   Dental advisory given  Plan Discussed with: CRNA,  Anesthesiologist and Surgeon  Anesthesia Plan Comments:                                          Anesthesia Evaluation  Patient identified by MRN, date of birth, ID band Patient awake    Reviewed: Allergy & Precautions, NPO status , Patient's Chart, lab work & pertinent test results  Airway Mallampati: II  TM Distance: >3 FB Neck ROM: Full    Dental  (+) Edentulous Upper, Poor Dentition   Pulmonary Current Smoker,  breath sounds clear to auscultation        Cardiovascular hypertension, Rhythm:Regular Rate:Normal     Neuro/Psych    GI/Hepatic   Endo/Other  diabetes  Renal/GU      Musculoskeletal   Abdominal   Peds  Hematology   Anesthesia Other Findings   Reproductive/Obstetrics                            Anesthesia Physical Anesthesia Plan  ASA: III  Anesthesia Plan: General   Post-op Pain Management:    Induction: Intravenous  Airway Management Planned: Oral ETT  Additional Equipment:   Intra-op Plan:   Post-operative Plan: Extubation in OR  Informed Consent: I have reviewed the patients History and Physical, chart, labs and discussed the procedure including the risks, benefits and alternatives for the proposed anesthesia with the patient or authorized representative  who has indicated his/her understanding and acceptance.     Plan Discussed with: CRNA and Anesthesiologist  Anesthesia Plan Comments:         Anesthesia Quick Evaluation                                   Anesthesia Evaluation  Patient identified by MRN, date of birth, ID band Patient awake    Reviewed: Allergy & Precautions, H&P , NPO status , Patient's Chart, lab work & pertinent test results  Airway Mallampati: II  TM Distance: >3 FB Neck ROM: Full    Dental   Pulmonary shortness of breath, sleep apnea , COPDCurrent Smoker,          Cardiovascular hypertension, Pt. on medications + Peripheral Vascular  Disease     Neuro/Psych    GI/Hepatic GERD-  Medicated and Controlled,(+) Hepatitis -, C  Endo/Other  diabetes, Type 2, Insulin Dependent  Renal/GU      Musculoskeletal   Abdominal   Peds  Hematology   Anesthesia Other Findings   Reproductive/Obstetrics                             Anesthesia Physical Anesthesia Plan  ASA: IV  Anesthesia Plan: General ETT   Post-op Pain Management:    Induction:   Airway Management Planned:   Additional Equipment:   Intra-op Plan:   Post-operative Plan:   Informed Consent:   Plan Discussed with:   Anesthesia Plan Comments:         Anesthesia Quick Evaluation  Anesthesia Quick Evaluation

## 2017-03-19 NOTE — Progress Notes (Signed)
  Progress Note    03/19/2017 7:42 AM 2 Days Post-Op  Subjective:  Patient would like nicotine patch   Vitals:   03/19/17 0002 03/19/17 0420  BP: (!) 145/76 (!) 141/79  Pulse: 63 76  Resp: 16 20  Temp: (!) 97.5 F (36.4 C) 97.9 F (36.6 C)  SpO2: 96% 98%   Physical Exam: Lungs:  Non labored Incisions:  Healthy wound bed, no purulence, erythema still present Extremities:  Palpable DP pulse Abdomen:  Soft Neurologic: A&O  CBC    Component Value Date/Time   WBC 8.6 03/18/2017 0325   RBC 4.38 03/18/2017 0325   HGB 13.2 03/18/2017 0325   HCT 40.0 03/18/2017 0325   PLT 283 03/18/2017 0325   MCV 91.3 03/18/2017 0325   MCH 30.1 03/18/2017 0325   MCHC 33.0 03/18/2017 0325   RDW 13.4 03/18/2017 0325   LYMPHSABS 1.8 03/17/2017 0746   MONOABS 0.7 03/17/2017 0746   EOSABS 0.4 03/17/2017 0746   BASOSABS 0.0 03/17/2017 0746    BMET    Component Value Date/Time   NA 134 (L) 03/17/2017 0750   K 4.6 03/17/2017 0750   CL 100 (L) 03/17/2017 0750   CO2 25 03/17/2017 0750   GLUCOSE 122 (H) 03/17/2017 0750   BUN 9 03/17/2017 0750   CREATININE 1.12 03/17/2017 0750   CREATININE 0.85 05/10/2014 1114   CALCIUM 8.5 (L) 03/17/2017 0750   CALCIUM 9.1 10/28/2009 2118   GFRNONAA >60 03/17/2017 0750   GFRNONAA >89 02/01/2014 1518   GFRAA >60 03/17/2017 0750   GFRAA >89 02/01/2014 1518    INR    Component Value Date/Time   INR 1.13 03/17/2017 0802     Intake/Output Summary (Last 24 hours) at 03/19/2017 0742 Last data filed at 03/19/2017 0421 Gross per 24 hour  Intake 1200 ml  Output 1100 ml  Net 100 ml     Assessment/Plan:  57 y.o. male is s/p Incision and drainage of right thigh abscess  2 Days Post-Op   Continue IV antibiotics Wet to dry packing BID R thigh Patent bypass given palpable pedal pulses Nicotine patch ordered   Dagoberto Ligas, PA-C [facility] [phone] 03/19/2017 7:42 AM

## 2017-03-19 NOTE — Transfer of Care (Signed)
Immediate Anesthesia Transfer of Care Note  Patient: [name]  Procedure(s) Performed: Irrigation and debridement of right leg with repair of femoral/tibial bypass graft. (Right Leg Lower)  Patient Location: PACU  Anesthesia Type:General  Level of Consciousness: awake, alert  and oriented  Airway & Oxygen Therapy: Patient Spontanous Breathing  Post-op Assessment: Report given to RN and Post -op Vital signs reviewed and stable  Post vital signs: Reviewed and stable  Last Vitals:  Vitals:   03/19/17 1002 03/19/17 1003  BP: 98/65   Pulse: 81   Resp: 20   Temp:  36.5 C  SpO2: 98%     Last Pain:  Vitals:   03/19/17 1003  TempSrc:   PainSc: Asleep         Complications: No apparent anesthesia complications

## 2017-03-19 NOTE — Progress Notes (Signed)
Called to see pt for bleeding from bypass graft.  At least 2 units of blood in bed.  Bleeding controlled with digital pressure.  Discussed with pt and wife.  Will need to return to OR for repair or possible ligation of bypass graft.  He understands that ligation may precipitate amputation of his leg.  He also understands limb vs life situation.  To OR emergently  Ruta Hinds, MD [facility] [place] Office: [phone] Pager: [phone]

## 2017-03-19 NOTE — Op Note (Signed)
Procedure: Incision and drainage of right leg repair of right tibial artery bypass  Preoperative diagnosis: Hemorrhage from right leg bypass  Postoperative diagnosis: Same  Anesthesia: General  Assistant: Gerri Lins PA-C  Indications: Patient is a 57 year old male status post incision and drainage of an abscess over his right femoral to anterior tibial artery bypass graft from self injection of heroin.  Operative findings: #1 severe induration of surrounding thigh tissue.  2 areas of erosion on graft repaired with single figure-of-eight 6-0 Prolene sutures.  Operative details: After obtaining informed consent, the patient was taken the operating.  The patient was placed in supine position operating table.  After induction of general anesthesia and endotracheal intubation, the patient's entire right lower extremity was prepped and draped in usual sterile fashion.  A pre-existing wound on the right lateral aspect of the leg was reopened.  There was currently no active bleeding.  Due to severe induration of the surrounding tissues I extended the incision about 3 cm proximally and distally so that we could obtain vascular control.  The bypass graft was dissected free circumferentially over about 5 cm.  The wound was thoroughly irrigated with normal saline solution.  There were 2 small spots of oozing on bypass grafts.  I repaired each of these with a single 6-0 Prolene figure-of-eight suture.  There was no obvious ongoing active hemorrhage other than these 2 oozing spots.  The bypass graft still had a pulse within it proximal and distal to this.  The skin edges were loosely approximated with 2-0 interrupted nylon sutures.  The patient tolerated the procedure well and there were no complications.  The instrument sponge needle count was correct at the end of the case.  The patient was taken the recovery room in stable condition.  Operative management: The patient remains at very high risk for  recurrent graft blowout and hemorrhage.  Ruta Hinds, MD [facility] [place] Office: [phone] Pager: [phone]

## 2017-03-19 NOTE — Anesthesia Postprocedure Evaluation (Signed)
Anesthesia Post Note  Patient: [name]  Procedure(s) Performed: Irrigation and debridement of right leg with repair of femoral/tibial bypass graft. (Right Leg Lower)     Patient location during evaluation: PACU Anesthesia Type: General Level of consciousness: sedated Pain management: pain level controlled Vital Signs Assessment: post-procedure vital signs reviewed and stable Respiratory status: spontaneous breathing and respiratory function stable Cardiovascular status: stable Postop Assessment: no apparent nausea or vomiting Anesthetic complications: no    Last Vitals:  Vitals:   03/19/17 1024 03/19/17 1051  BP:  110/73  Pulse:  69  Resp:  19  Temp: 36.7 C 36.7 C  SpO2:  98%                  Sharmin Foulk DANIEL

## 2017-03-20 ENCOUNTER — Encounter (HOSPITAL_COMMUNITY): Payer: Self-pay | Admitting: Vascular Surgery

## 2017-03-20 LAB — AEROBIC CULTURE  (SUPERFICIAL SPECIMEN): GRAM STAIN: NONE SEEN

## 2017-03-20 LAB — AEROBIC CULTURE W GRAM STAIN (SUPERFICIAL SPECIMEN)

## 2017-03-20 MED ORDER — VANCOMYCIN HCL IN DEXTROSE 1-5 GM/200ML-% IV SOLN: 1000.0000 mg | Freq: Two times a day (BID) | INTRAVENOUS | Status: DC

## 2017-03-20 NOTE — Progress Notes (Addendum)
[facility] [place]  Subjective  - No new complaints, no new bleeding episodes from right thigh.   Objective (!) 155/80 86 98.4 F (36.9 C) (Oral) 13 98%  Intake/Output Summary (Last 24 hours) at 03/20/2017 0817 Last data filed at 03/20/2017 0636 Gross per 24 hour  Intake 2210 ml  Output 1725 ml  Net 485 ml    Palpable DP pulse.  By pass pulse through out. Minimal SS drainage at incision site.  Sutures intact.  Clean dry dressing applied.  No abnormal erythema or edema surrounding the incision. Heart RRR Lungs non labored breathing Disposition stable  Assessment/Planning: 57 y.o. male is s/p Incision and drainage of right thigh abscess  3 Days Post-Op   POD # 1 repair of right lateral bypass Continue IV antibiotics  Dry dressing daily/PRN to right lateral thigh. WBC 6.9, afebrile, HGB 11.6 after bleeding incident and surgical intervention stable. Continue to observe by paas for bleeding and infection.   Roxy Horseman 03/20/2017 8:17 AM --  Laboratory Lab Results: Recent Labs    03/18/17 0325 03/19/17 1418  WBC 8.6 6.9  HGB 13.2 11.6*  HCT 40.0 36.6*  PLT 283 293   BMET Recent Labs    03/19/17 1418  NA 138  K 4.2  CL 103  CO2 28  GLUCOSE 117*  BUN 9  CREATININE 0.96  CALCIUM 8.5*    COAG Lab Results  Component Value Date   INR 1.13 03/17/2017   INR 1.08 11/21/2014   INR 1.03 08/15/2014   No results found for: PTT  I have examined the patient, reviewed and agree with above.  Palpable right femoral anterior graft pulse and 2+ dorsalis pedis pulse.  Will check wound tomorrow.  Discussed with the patient and his family present.  Fact that if he loses his from anterior to tibial bypass that in all likelihood will have a nonsalvageable foot.  He is also questioning pain management at discharge.  Apparently his pain management doctor has lost his license.  Explained that I am certainly not qualified to provide long-term  pain management but that we will assist him on the short-term after discharge.  Curt Jews, MD 03/20/2017 9:09 AM

## 2017-03-20 NOTE — Progress Notes (Addendum)
Pharmacy Antibiotic Note  [name] is a 57 y.o. male admitted on 03/17/2017 with cellulitis with abscess from IVDA.  Pharmacy has been consulted for vancomycin and Zosyn dosing. Hx of 2 fem-pop bypasses, R pedal pulse present. CrCl ~49mL/min  Wound culture with MSSA  Plan: Stopped Vancomycin after MD discussion Continue Zosyn 3.375 grams iv Q 8 hours - 4 hr infusion    Height: 6\' 1"  (185.4 cm) Weight: 180 lb (81.6 kg) IBW/kg (Calculated) : 79.9  Temp (24hrs), Avg:98.1 F (36.7 C), Min:97.7 F (36.5 C), Max:98.5 F (36.9 C)  Recent Labs  Lab 03/17/17 0746 03/17/17 0750 03/17/17 0901 03/18/17 0325 03/19/17 1418  WBC 8.4  --   --  8.6 6.9  CREATININE  --  1.12  --   --  0.96  LATICACIDVEN  --   --  0.73  --   --     Estimated Creatinine Clearance: 94.8 mL/min (by C-G formula based on SCr of 0.96 mg/dL).    Allergies  Allergen Reactions  . Buprenorphine Hcl-Naloxone Hcl Shortness Of Breath and Other (See Comments)    "feel like going to die" jittery, trouble breathing, feels hot (reaction to Suboxone)  . Ciprofloxacin Anaphylaxis  . Embeda [Morphine-Naltrexone] Other (See Comments)    seizures  . Shellfish-Derived Products Anaphylaxis, Hives and Swelling  . Fish Allergy Hives, Swelling and Rash  . Morphine And Related     UNKNOWN  . Benadryl [Diphenhydramine Hcl] Hives, Itching and Rash  . Diphenhydramine Hcl Hives, Itching and Rash  . Vicodin [Hydrocodone-Acetaminophen] Other (See Comments)    Nausea only   Vanc 3/6>> Zosyn 3/6>>  3/6 abscess: MSSA  Thank you for allowing pharmacy to be a part of this patient's care. Anette Guarneri, PharmD [phone] 03/20/2017 12:00 PM

## 2017-03-21 NOTE — Progress Notes (Addendum)
[facility] [place]  Subjective  - Wants to know when he can go home.   Objective 134/66 81 97.6 F (36.4 C) (Oral) 13 99%  Intake/Output Summary (Last 24 hours) at 03/21/2017 0926 Last data filed at 03/21/2017 0450 Gross per 24 hour  Intake 4231.5 ml  Output 450 ml  Net 3781.5 ml    Palpable by pass pulse, Dp palpable right LE Incision without active drainage, dry dressing replaced, sutures intact.  No erythema surround sutures. Abdomin soft tole PO's No N/V Ambulating in halls and room without difficulty Heart RRR Lungs non labored breathing, cough Stable disposition NAD  Assessment/Planning: 57 y.o.maleis s/p Incision and drainage of right thigh abscess 4 Days Post-Op   POD # 2 repair of right lateral bypass Continue IV antibiotics, staph infection per culture likely discharge on oral antibiotics. HGB stable 11.6 Afebrile, WBC 6.9      Roxy Horseman 03/21/2017 9:26 AM --  Laboratory Lab Results: Recent Labs    03/19/17 1418  WBC 6.9  HGB 11.6*  HCT 36.6*  PLT 293   BMET Recent Labs    03/19/17 1418  NA 138  K 4.2  CL 103  CO2 28  GLUCOSE 117*  BUN 9  CREATININE 0.96  CALCIUM 8.5*    COAG Lab Results  Component Value Date   INR 1.13 03/17/2017   INR 1.08 11/21/2014   INR 1.03 08/15/2014   No results found for: PTT  I have examined the patient, reviewed and agree with above.Wean iv narcotics. Wd healing   Curt Jews, MD 03/21/2017 11:01 AM

## 2017-03-22 LAB — AEROBIC/ANAEROBIC CULTURE W GRAM STAIN (SURGICAL/DEEP WOUND)

## 2017-03-22 LAB — AEROBIC/ANAEROBIC CULTURE (SURGICAL/DEEP WOUND)

## 2017-03-22 MED ORDER — SULFAMETHOXAZOLE-TRIMETHOPRIM 400-80 MG PO TABS: 1.0000 | ORAL_TABLET | Freq: Two times a day (BID) | ORAL | 0 refills | Status: AC

## 2017-03-22 MED ORDER — HYDROMORPHONE HCL 1 MG/ML IJ SOLN: 0.5000 mg | INTRAMUSCULAR | Status: DC | PRN

## 2017-03-22 MED ORDER — OXYCODONE HCL 10 MG PO TABS: 10.0000 mg | ORAL_TABLET | ORAL | 0 refills | Status: DC | PRN

## 2017-03-22 NOTE — Progress Notes (Signed)
  Progress Note    03/22/2017 7:27 AM 3 Days Post-Op  Subjective:  Anticipating discharge today   Vitals:   03/21/17 2348 03/22/17 0329  BP: 126/64 138/70  Pulse: 83 76  Resp: 15 18  Temp:  97.7 F (36.5 C)  SpO2: 99% 99%   Physical Exam: Lungs:  No increased resp effort Incisions:  R lateral thigh incision with minimal sanguinous collection on dressing; no active bleeding Extremities:  Palpable flow through bypass in distal thigh Abdomen:  Soft Neurologic: A&O  CBC    Component Value Date/Time   WBC 6.9 03/19/2017 1418   RBC 3.89 (L) 03/19/2017 1418   HGB 11.6 (L) 03/19/2017 1418   HCT 36.6 (L) 03/19/2017 1418   PLT 293 03/19/2017 1418   MCV 94.1 03/19/2017 1418   MCH 29.8 03/19/2017 1418   MCHC 31.7 03/19/2017 1418   RDW 13.6 03/19/2017 1418   LYMPHSABS 1.8 03/17/2017 0746   MONOABS 0.7 03/17/2017 0746   EOSABS 0.4 03/17/2017 0746   BASOSABS 0.0 03/17/2017 0746    BMET    Component Value Date/Time   NA 138 03/19/2017 1418   K 4.2 03/19/2017 1418   CL 103 03/19/2017 1418   CO2 28 03/19/2017 1418   GLUCOSE 117 (H) 03/19/2017 1418   BUN 9 03/19/2017 1418   CREATININE 0.96 03/19/2017 1418   CREATININE 0.85 05/10/2014 1114   CALCIUM 8.5 (L) 03/19/2017 1418   CALCIUM 9.1 10/28/2009 2118   GFRNONAA >60 03/19/2017 1418   GFRNONAA >89 02/01/2014 1518   GFRAA >60 03/19/2017 1418   GFRAA >89 02/01/2014 1518    INR    Component Value Date/Time   INR 1.13 03/17/2017 0802     Intake/Output Summary (Last 24 hours) at 03/22/2017 0727 Last data filed at 03/22/2017 0539 Gross per 24 hour  Intake 840 ml  Output 375 ml  Net 465 ml     Assessment/Plan:  57 y.o. male is s/p I&D R thigh abscess with subsequent repair of bypass dur to erosion and bleeding 3 Days Post-Op   Bypass RLE remains patent Patient will likely be discharged with P.o. Antibiotic regimen Follow up in office for wound check in 2 weeks   Dagoberto Ligas, PA-C [facility] [phone] 03/22/2017 7:27 AM

## 2017-03-22 NOTE — Discharge Summary (Signed)
Physician Discharge Summary   Patient ID: [name] [id number] 57 y.o. Dec 31, 1960  Admit date: 03/17/2017  Discharge date and time: 03/22/2017 10:39 AM   Admitting Physician: Elam Dutch, MD   Discharge Physician: Dr. 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  He had been injecting heroin into bypass graft at the site abscess formed.  He was taken to the OR by Dr. Oneida Alar on 03/17/17 for incision and drainage of thigh abscess.  He tolerated this procedure well and was admitted for IV antibiotics as well as wound care.  POD #2 there is an area of the bypass graft that eroded and began bleeding.  He was taken emergently to the OR by Dr. Oneida Alar for repair of femoral to tibial artery bypass.  He again tolerated this procedure well.  Culture taken from right thigh abscess grew out staph aureus.  Over the next several days he received wound care and continued IV antibiotics.  He will be discharged today with 10 day Bactrim regimen.  He will also be discharged with #40 10mg  oxycodone.  He was made aware that we will help with pain management in post operative phase however he will need to establish with a new pain management provider to receive long term pain management care.  He was also made aware that this prescription should be plenty to control post operative pain at incision site and that he will not be provided any refills.  The patient and his wife verbalized their understanding.  He will follow up in office in about 2 weeks for wound check.  Discharge instructions were reviewed with the patient and his wife.  He will be discharged home this morning in  stable condition.  Consults: None  Treatments: surgery: incision and drainage of R thigh abscess on 03/17/17 by Dr. Oneida Alar; repair of R tibial artery bypass on 03/19/17 by Dr. Oneida Alar  Discharge Exam: see progress note 03/22/17 Vitals:   03/22/17 0752 03/22/17 0800  BP: 129/79   Pulse: 81 (!) 149  Resp: 16 (!) 28  Temp: (!) 97.4 F (36.3 C)   SpO2: 100% (!) 86%    Disposition: 01-Home or Self Care  Patient Instructions:  Allergies as of 03/22/2017      Reactions   Buprenorphine Hcl-naloxone Hcl Shortness Of Breath, Other (See Comments)   "feel like going to die" jittery, trouble breathing, feels hot (reaction to Suboxone)   Ciprofloxacin Anaphylaxis   Embeda [morphine-naltrexone] Other (See Comments)   seizures   Shellfish-derived Products Anaphylaxis, Hives, Swelling   Fish Allergy Hives, Swelling, Rash   Morphine And Related    UNKNOWN   Benadryl [diphenhydramine Hcl] Hives, Itching, Rash   Diphenhydramine Hcl Hives, Itching, Rash   Vicodin [hydrocodone-acetaminophen] Other (See Comments)   Nausea only      Medication List    STOP taking these medications   azithromycin 250 MG tablet Commonly known as:  ZITHROMAX   oxyCODONE-acetaminophen 7.5-500 MG tablet Commonly known as:  PERCOCET     TAKE these medications   albuterol (2.5 MG/3ML) 0.083% nebulizer solution Commonly known as:  PROVENTIL Take 2.5 mg by nebulization every 6 (six) hours as needed for wheezing or shortness of breath. Notes  to patient:  No recent doses   albuterol 108 (90 Base) MCG/ACT inhaler Commonly known as:  PROVENTIL HFA;VENTOLIN HFA Inhale 2 puffs into the lungs every 4 (four) hours as needed for wheezing or shortness of breath. Notes to patient:  No recent doses.   cetirizine-pseudoephedrine 5-120 MG tablet Commonly known as:  ZYRTEC-D Take 1 tablet by mouth daily. Notes to patient:  No recent doses, please take as ordered.   clopidogrel 75 MG tablet Commonly known as:  PLAVIX Take 1  tablet (75 mg total) by mouth daily.   diclofenac sodium 1 % Gel Commonly known as:  VOLTAREN Apply 1 application topically as needed. Notes to patient:  No recent doses.   fluticasone 50 MCG/ACT nasal spray Commonly known as:  FLONASE Place 2 sprays into both nostrils daily.   hydrOXYzine 25 MG capsule Commonly known as:  VISTARIL Take 25 mg by mouth 2 (two) times daily. Itching Notes to patient:  No recent doses, please take as ordered.   LYRICA 75 MG capsule Generic drug:  pregabalin TAKE 1 CAPSULE BY MOUTH THREE TIMES A DAY What changed:  See the new instructions. Notes to patient:  Last dose 03/22/2017 @ 10:00am   nicotine 21 mg/24hr patch Commonly known as:  NICODERM CQ - dosed in mg/24 hours Place 21 mg onto the skin daily.   Oxycodone HCl 10 MG Tabs Take 1 tablet (10 mg total) by mouth every 4 (four) hours as needed for moderate pain. Notes to patient:  Last dose 03/22/2017 @ 10:00am   pantoprazole 40 MG tablet Commonly known as:  PROTONIX Take 40 mg by mouth daily.   ranitidine 150 MG tablet Commonly known as:  ZANTAC Take 1 tablet (150 mg total) by mouth every 12 (twelve) hours as needed for heartburn. Notes to patient:  No recent doses, please take as ordered.   sulfamethoxazole-trimethoprim 400-80 MG tablet Commonly known as:  BACTRIM Take 1 tablet by mouth 2 (two) times daily for 10 days. Notes to patient:  No recent doses, please take as ordered.      Activity: activity as tolerated Diet: regular diet Wound Care: keep wound clean and dry and reinforce dressing PRN  Follow-up with Dr. Donnetta Hutching in 2 weeks.  SignedDagoberto Ligas 03/22/2017 12:54 PM

## 2017-03-22 NOTE — Care Management Note (Signed)
Case Management Note Marvetta Gibbons RN, BSN Unit 4E-Case Manager [phone]  Patient Details  Name: [name] MRN: [id number] Date of Birth: [birth year]  Subjective/Objective:  Pt admitted with abscess s/p I&D                  Action/Plan: PTA pt lived at home, plan to return home, no CM needs noted for transition to home- plan to d/c on oral abx.   Expected Discharge Date:  03/22/17               Expected Discharge Plan:  Home/Self Care  In-House Referral:  NA  Discharge planning Services  CM Consult, NA  Post Acute Care Choice:  NA Choice offered to:  NA  DME Arranged:    DME Agency:     HH Arranged:    HH Agency:     Status of Service:  Completed, signed off  If discussed at [place] of Stay Meetings, dates discussed:    Discharge Disposition: home/self care   Additional Comments:  Dawayne Patricia, RN 03/22/2017, 10:36 AM

## 2017-03-22 NOTE — Progress Notes (Signed)
Patient wants to know when he can go home. Patient further educated on the need to wean IV narcotics before discharge. Patient verbalized understanding but is still asking for and receiving IV dilaudid every 2 hours for a pain score of 8-10. Will continue to educate and monitor patients pain management. Lajoyce Corners, RN

## 2017-03-22 NOTE — Progress Notes (Signed)
Pt discharged from unit, vital signs stable, and pt consulted by vascular surgery provider, Dagoberto Ligas, PA and Case Management, Cristi Webster.  IV removed and pt education performed about medications and appointments.

## 2017-03-23 ENCOUNTER — Telehealth: Payer: Self-pay | Admitting: Vascular Surgery

## 2017-03-23 NOTE — Telephone Encounter (Signed)
-----   Message from Mena Goes, RN sent at 03/22/2017 10:36 AM EDT ----- Regarding: 2 weeks with Dr. Donnetta Hutching for wound check   ----- Message ----- From: Iline Oven Sent: 03/22/2017  10:15 AM To: [facility] Charge Pool  Can you schedule an appt for this pt in 2 weeks with Dr. Donnetta Hutching for wound check.  PO R thigh I&D. Thanks, [facility]

## 2017-03-23 NOTE — Telephone Encounter (Signed)
Tried to leave vm for pt 04/06/17 appt but vm box full. Mailed letter

## 2017-04-06 ENCOUNTER — Encounter: Payer: Self-pay | Admitting: Vascular Surgery

## 2017-04-06 ENCOUNTER — Ambulatory Visit: Payer: Self-pay | Admitting: Vascular Surgery

## 2017-04-06 ENCOUNTER — Ambulatory Visit (INDEPENDENT_AMBULATORY_CARE_PROVIDER_SITE_OTHER): Payer: Medicaid Other | Admitting: Vascular Surgery

## 2017-04-06 VITALS — BP 118/67 | HR 84 | Temp 96.9°F | Resp 18 | Ht 73.0 in | Wt 167.0 lb

## 2017-04-06 DIAGNOSIS — I779 Disorder of arteries and arterioles, unspecified: Secondary | ICD-10-CM

## 2017-04-06 NOTE — Progress Notes (Signed)
POST OPERATIVE OFFICE NOTE    CC:  F/u for surgery  HPI:  This is a 57 y.o. male who is s/p I&D of right thigh abscess on 03/17/17 and then again on 03/19/17 by Dr. Oneida Alar.  He returns today for follow up.  He states he is doing well. He says his incision looks good and not infected.    Allergies  Allergen Reactions  . Buprenorphine Hcl-Naloxone Hcl Shortness Of Breath and Other (See Comments)    "feel like going to die" jittery, trouble breathing, feels hot (reaction to Suboxone)  . Ciprofloxacin Anaphylaxis  . Embeda [Morphine-Naltrexone] Other (See Comments)    seizures  . Shellfish-Derived Products Anaphylaxis, Hives and Swelling  . Fish Allergy Hives, Swelling and Rash  . Morphine And Related     UNKNOWN  . Benadryl [Diphenhydramine Hcl] Hives, Itching and Rash  . Diphenhydramine Hcl Hives, Itching and Rash  . Vicodin [Hydrocodone-Acetaminophen] Other (See Comments)    Nausea only    Current Outpatient Medications  Medication Sig Dispense Refill  . albuterol (PROVENTIL HFA;VENTOLIN HFA) 108 (90 Base) MCG/ACT inhaler Inhale 2 puffs into the lungs every 4 (four) hours as needed for wheezing or shortness of breath. 1 Inhaler 1  . albuterol (PROVENTIL) (2.5 MG/3ML) 0.083% nebulizer solution Take 2.5 mg by nebulization every 6 (six) hours as needed for wheezing or shortness of breath.    . cetirizine-pseudoephedrine (ZYRTEC-D) 5-120 MG tablet Take 1 tablet by mouth daily. 30 tablet 0  . clopidogrel (PLAVIX) 75 MG tablet Take 1 tablet (75 mg total) by mouth daily. 30 tablet 6  . diclofenac sodium (VOLTAREN) 1 % GEL Apply 1 application topically as needed.    . fluticasone (FLONASE) 50 MCG/ACT nasal spray Place 2 sprays into both nostrils daily. 9.9 g 1  . hydrOXYzine (VISTARIL) 25 MG capsule Take 25 mg by mouth 2 (two) times daily. Itching    . LYRICA 75 MG capsule TAKE 1 CAPSULE BY MOUTH THREE TIMES A DAY (Patient taking differently: TAKE 1 CAPSULE 75mg  BY MOUTH THREE TIMES A DAY) 90  capsule 0  . nicotine (NICODERM CQ - DOSED IN MG/24 HOURS) 21 mg/24hr patch Place 21 mg onto the skin daily.    . pantoprazole (PROTONIX) 40 MG tablet Take 40 mg by mouth daily.    . ranitidine (ZANTAC) 150 MG tablet Take 1 tablet (150 mg total) by mouth every 12 (twelve) hours as needed for heartburn. 60 tablet 11  . oxyCODONE 10 MG TABS Take 1 tablet (10 mg total) by mouth every 4 (four) hours as needed for moderate pain. (Patient not taking: Reported on 04/06/2017) 40 tablet 0   No current facility-administered medications for this visit.      ROS:  See HPI  Physical Exam:  Vitals:   04/06/17 1258  BP: 118/67  Pulse: 84  Resp: 18  Temp: (!) 96.9 F (36.1 C)  SpO2: 96%    Incision:  Clean and dry with nylon sutures in tact; healing nicely Extremities:  Easily palpable graft pulse right leg   Assessment/Plan:  This is a 57 y.o. male who is s/p: I&D of right thigh abscess on 03/17/17 and then again on 03/19/17 by Dr. Oneida Alar.  -pt doing well-his incision has healed nicely.  Will get sutures out today -will have him f/u in one year with ABI's and duplex of right leg bypass graft. -pt understands that we will not prescribe him narcotics and that he needs to get a new PCP or pain  management MD. -he will call sooner if he has any issues.    [name] Locket, PA-C [facility] [phone]  Clinic MD:  Pt seen and examined with Dr. Donnetta [name]  I have examined the patient, reviewed and agree with above.  Get sutures out today.  Asking for our narcotics but instructed him that his pain is long-standing and multifactorial and not related to his incision and that we will not prescribe process narcotics from our office  Curt Jews, MD 04/06/2017 1:53 PM

## 2017-04-07 ENCOUNTER — Emergency Department (HOSPITAL_COMMUNITY)
Admission: EM | Admit: 2017-04-07 | Discharge: 2017-04-08 | Disposition: A | Discharge status: Home / Self Care | Payer: Medicaid Other | Attending: Emergency Medicine | Admitting: Emergency Medicine

## 2017-04-07 ENCOUNTER — Encounter (HOSPITAL_COMMUNITY): Payer: Self-pay | Admitting: *Deleted

## 2017-04-07 ENCOUNTER — Other Ambulatory Visit: Payer: Self-pay

## 2017-04-07 ENCOUNTER — Emergency Department (HOSPITAL_COMMUNITY): Payer: Medicaid Other

## 2017-04-07 DIAGNOSIS — Z7902 Long term (current) use of antithrombotics/antiplatelets: Secondary | ICD-10-CM | POA: Diagnosis not present

## 2017-04-07 DIAGNOSIS — R0602 Shortness of breath: Secondary | ICD-10-CM | POA: Diagnosis not present

## 2017-04-07 DIAGNOSIS — I110 Hypertensive heart disease with heart failure: Secondary | ICD-10-CM | POA: Diagnosis not present

## 2017-04-07 DIAGNOSIS — F17210 Nicotine dependence, cigarettes, uncomplicated: Secondary | ICD-10-CM | POA: Insufficient documentation

## 2017-04-07 DIAGNOSIS — Z79899 Other long term (current) drug therapy: Secondary | ICD-10-CM | POA: Diagnosis not present

## 2017-04-07 DIAGNOSIS — E119 Type 2 diabetes mellitus without complications: Secondary | ICD-10-CM | POA: Insufficient documentation

## 2017-04-07 DIAGNOSIS — I509 Heart failure, unspecified: Secondary | ICD-10-CM | POA: Diagnosis not present

## 2017-04-07 DIAGNOSIS — J449 Chronic obstructive pulmonary disease, unspecified: Secondary | ICD-10-CM | POA: Diagnosis not present

## 2017-04-07 DIAGNOSIS — R079 Chest pain, unspecified: Secondary | ICD-10-CM | POA: Diagnosis not present

## 2017-04-07 LAB — I-STAT CHEM 8, ED
BUN: 17 mg/dL (ref 6–20)
CALCIUM ION: 1.02 mmol/L — AB (ref 1.15–1.40)
CHLORIDE: 104 mmol/L (ref 101–111)
CREATININE: 0.8 mg/dL (ref 0.61–1.24)
GLUCOSE: 107 mg/dL — AB (ref 65–99)
HCT: 36 % — ABNORMAL LOW (ref 39.0–52.0)
Hemoglobin: 12.2 g/dL — ABNORMAL LOW (ref 13.0–17.0)
Potassium: 4.9 mmol/L (ref 3.5–5.1)
Sodium: 139 mmol/L (ref 135–145)
TCO2: 26 mmol/L (ref 22–32)

## 2017-04-07 LAB — CBC WITH DIFFERENTIAL/PLATELET
Basophils Absolute: 0 10*3/uL (ref 0.0–0.1)
Basophils Relative: 0 %
EOS PCT: 4 %
Eosinophils Absolute: 0.3 10*3/uL (ref 0.0–0.7)
HEMATOCRIT: 35.3 % — AB (ref 39.0–52.0)
Hemoglobin: 11.2 g/dL — ABNORMAL LOW (ref 13.0–17.0)
LYMPHS PCT: 27 %
Lymphs Abs: 1.9 10*3/uL (ref 0.7–4.0)
MCH: 29.3 pg (ref 26.0–34.0)
MCHC: 31.7 g/dL (ref 30.0–36.0)
MCV: 92.4 fL (ref 78.0–100.0)
MONO ABS: 0.5 10*3/uL (ref 0.1–1.0)
MONOS PCT: 8 %
NEUTROS ABS: 4.1 10*3/uL (ref 1.7–7.7)
Neutrophils Relative %: 61 %
PLATELETS: 271 10*3/uL (ref 150–400)
RBC: 3.82 MIL/uL — ABNORMAL LOW (ref 4.22–5.81)
RDW: 14.4 % (ref 11.5–15.5)
WBC: 6.8 10*3/uL (ref 4.0–10.5)

## 2017-04-07 LAB — I-STAT TROPONIN, ED
TROPONIN I, POC: 0 ng/mL (ref 0.00–0.08)
Troponin i, poc: 0.01 ng/mL (ref 0.00–0.08)

## 2017-04-07 LAB — BRAIN NATRIURETIC PEPTIDE: B Natriuretic Peptide: 56.2 pg/mL (ref 0.0–100.0)

## 2017-04-07 MED ORDER — IOPAMIDOL (ISOVUE-370) INJECTION 76%
INTRAVENOUS | Status: AC
  Administered 2017-04-07: 100 mL
  Filled 2017-04-07: qty 100

## 2017-04-07 MED ORDER — ALBUTEROL SULFATE (2.5 MG/3ML) 0.083% IN NEBU
5.0000 mg | INHALATION_SOLUTION | Freq: Once | RESPIRATORY_TRACT | Status: AC
  Administered 2017-04-07: 5 mg via RESPIRATORY_TRACT
  Filled 2017-04-07: qty 6

## 2017-04-07 NOTE — Discharge Instructions (Signed)
It was my pleasure taking care of you today!   Please follow-up with your primary care doctor.  If you do not have one, please see the information below.  Return to ER for new or worsening symptoms, any additional concerns.   To find a primary care or specialty doctor please call [phone] or [phone] to access "[facility]."  You may also go on the [facility] website at [email or web address]  There are also multiple [facility], [facility] and [facility] practices throughout the [place] that are frequently accepting new patients. You may find a clinic that is close to your home and contact them.  [place] [address] [phone]  [facility] in [place] (also locations in [place] and [place]) - [place] [phone]  [place] [phone]

## 2017-04-07 NOTE — ED Notes (Signed)
Patient transported to CT 

## 2017-04-07 NOTE — ED Triage Notes (Signed)
C/o sob and productive cough onset 2 weeks ago , states he smokes 2 ppd. States he was recently discharged from hospital  for left infection.

## 2017-04-07 NOTE — ED Notes (Signed)
Attempted IV x 2 unsuccessful.
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
## 2017-04-22 ENCOUNTER — Ambulatory Visit (HOSPITAL_COMMUNITY)
Admission: EM | Admit: 2017-04-22 | Discharge: 2017-04-22 | Disposition: A | Discharge status: Home / Self Care | Payer: Medicaid Other | Attending: Internal Medicine | Admitting: Internal Medicine

## 2017-04-22 ENCOUNTER — Other Ambulatory Visit: Payer: Self-pay

## 2017-04-22 ENCOUNTER — Encounter (HOSPITAL_COMMUNITY): Payer: Self-pay | Admitting: Emergency Medicine

## 2017-04-22 DIAGNOSIS — Z760 Encounter for issue of repeat prescription: Secondary | ICD-10-CM

## 2017-04-22 DIAGNOSIS — K219 Gastro-esophageal reflux disease without esophagitis: Secondary | ICD-10-CM

## 2017-04-22 DIAGNOSIS — I739 Peripheral vascular disease, unspecified: Secondary | ICD-10-CM

## 2017-04-22 DIAGNOSIS — R0602 Shortness of breath: Secondary | ICD-10-CM

## 2017-04-22 DIAGNOSIS — F419 Anxiety disorder, unspecified: Secondary | ICD-10-CM

## 2017-04-22 MED ORDER — HYDROXYZINE PAMOATE 25 MG PO CAPS: 25.0000 mg | ORAL_CAPSULE | Freq: Two times a day (BID) | ORAL | 0 refills | Status: DC

## 2017-04-22 MED ORDER — OXYCODONE-ACETAMINOPHEN 7.5-325 MG PO TABS: 1.0000 | ORAL_TABLET | Freq: Three times a day (TID) | ORAL | 0 refills | Status: DC | PRN

## 2017-04-22 MED ORDER — ALBUTEROL SULFATE (2.5 MG/3ML) 0.083% IN NEBU: 2.5000 mg | INHALATION_SOLUTION | Freq: Four times a day (QID) | RESPIRATORY_TRACT | 0 refills | Status: DC | PRN

## 2017-04-22 MED ORDER — ALBUTEROL SULFATE HFA 108 (90 BASE) MCG/ACT IN AERS: 2.0000 | INHALATION_SPRAY | RESPIRATORY_TRACT | 1 refills | Status: DC | PRN

## 2017-04-22 MED ORDER — CLOPIDOGREL BISULFATE 75 MG PO TABS: 75.0000 mg | ORAL_TABLET | Freq: Every day | ORAL | 0 refills | Status: DC

## 2017-04-22 MED ORDER — FLUTICASONE PROPIONATE 50 MCG/ACT NA SUSP: 2.0000 | Freq: Every day | NASAL | 0 refills | Status: DC

## 2017-04-22 MED ORDER — RANITIDINE HCL 150 MG PO TABS: 150.0000 mg | ORAL_TABLET | Freq: Two times a day (BID) | ORAL | 0 refills | Status: DC | PRN

## 2017-04-22 MED ORDER — ONDANSETRON HCL 4 MG PO TABS: 4.0000 mg | ORAL_TABLET | Freq: Three times a day (TID) | ORAL | 0 refills | Status: DC | PRN

## 2017-04-22 MED ORDER — AZITHROMYCIN 250 MG PO TABS: 250.0000 mg | ORAL_TABLET | Freq: Every day | ORAL | 0 refills | Status: DC

## 2017-04-22 MED ORDER — CETIRIZINE-PSEUDOEPHEDRINE ER 5-120 MG PO TB12: 1.0000 | ORAL_TABLET | Freq: Every day | ORAL | 0 refills | Status: DC

## 2017-04-22 MED ORDER — PANTOPRAZOLE SODIUM 20 MG PO TBEC: 20.0000 mg | DELAYED_RELEASE_TABLET | Freq: Every day | ORAL | 0 refills | Status: DC

## 2017-04-22 NOTE — ED Triage Notes (Signed)
Hx of anxiety, c/o productive cough and sweats with SOB and insomnia

## 2017-04-22 NOTE — Discharge Instructions (Addendum)
I have refilled your medications.  Please begin taking as prescribed.  I have refilled your oxycodone, but please stretch these out to every 10-12 hours as needed instead of every 8.  Please continue your efforts to get a new primary care doctor.  Please contact community health and wellness.  For your shortness of breath please use your albuterol inhaler nebulizers now that they are refilled.  Please begin azithromycin-2 tablets today, 1 tablet for the following 4 days.  For congestion please begin Zyrtec-D as well as Flonase nasal spray.  Please follow-up if symptoms not improving with treatment or worsening.  Please return if developing more difficulty breathing.
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
## 2017-05-14 ENCOUNTER — Encounter (HOSPITAL_COMMUNITY): Payer: Self-pay | Admitting: *Deleted

## 2017-05-14 ENCOUNTER — Emergency Department (HOSPITAL_COMMUNITY)
Admission: EM | Admit: 2017-05-14 | Discharge: 2017-05-14 | Disposition: A | Discharge status: Home / Self Care | Payer: Medicaid Other | Attending: Emergency Medicine | Admitting: Emergency Medicine

## 2017-05-14 DIAGNOSIS — J449 Chronic obstructive pulmonary disease, unspecified: Secondary | ICD-10-CM | POA: Diagnosis not present

## 2017-05-14 DIAGNOSIS — G8929 Other chronic pain: Secondary | ICD-10-CM | POA: Insufficient documentation

## 2017-05-14 DIAGNOSIS — M791 Myalgia, unspecified site: Secondary | ICD-10-CM | POA: Diagnosis not present

## 2017-05-14 DIAGNOSIS — Z79899 Other long term (current) drug therapy: Secondary | ICD-10-CM | POA: Diagnosis not present

## 2017-05-14 DIAGNOSIS — Z8673 Personal history of transient ischemic attack (TIA), and cerebral infarction without residual deficits: Secondary | ICD-10-CM | POA: Diagnosis not present

## 2017-05-14 DIAGNOSIS — I110 Hypertensive heart disease with heart failure: Secondary | ICD-10-CM | POA: Insufficient documentation

## 2017-05-14 DIAGNOSIS — J45909 Unspecified asthma, uncomplicated: Secondary | ICD-10-CM | POA: Insufficient documentation

## 2017-05-14 DIAGNOSIS — F1120 Opioid dependence, uncomplicated: Secondary | ICD-10-CM | POA: Diagnosis not present

## 2017-05-14 DIAGNOSIS — Z7902 Long term (current) use of antithrombotics/antiplatelets: Secondary | ICD-10-CM | POA: Insufficient documentation

## 2017-05-14 DIAGNOSIS — E119 Type 2 diabetes mellitus without complications: Secondary | ICD-10-CM | POA: Insufficient documentation

## 2017-05-14 DIAGNOSIS — F17210 Nicotine dependence, cigarettes, uncomplicated: Secondary | ICD-10-CM | POA: Insufficient documentation

## 2017-05-14 DIAGNOSIS — I509 Heart failure, unspecified: Secondary | ICD-10-CM | POA: Insufficient documentation

## 2017-05-14 DIAGNOSIS — F419 Anxiety disorder, unspecified: Secondary | ICD-10-CM | POA: Diagnosis present

## 2017-05-14 NOTE — ED Triage Notes (Signed)
Pt c/o anxiety stating "I keep thinking about my anxiety attack.  It causes me anxiety."
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
## 2017-05-14 NOTE — ED Notes (Signed)
Patient upset and left without receiving discharge papers or signing signature pad. This Probation officer asked patient to sign signature pad 3 times before he leaves. Patient upset that MD not able to anything for him and storms out.

## 2017-05-25 ENCOUNTER — Inpatient Hospital Stay (HOSPITAL_COMMUNITY)
Admission: EM | Admit: 2017-05-25 | Discharge: 2017-06-04 | DRG: 253 | Disposition: A | Discharge status: Home Health | Payer: Medicaid Other | Attending: Vascular Surgery | Admitting: Vascular Surgery

## 2017-05-25 ENCOUNTER — Other Ambulatory Visit: Payer: Self-pay

## 2017-05-25 ENCOUNTER — Encounter (HOSPITAL_COMMUNITY): Payer: Self-pay | Admitting: Emergency Medicine

## 2017-05-25 DIAGNOSIS — Z7951 Long term (current) use of inhaled steroids: Secondary | ICD-10-CM

## 2017-05-25 DIAGNOSIS — B955 Unspecified streptococcus as the cause of diseases classified elsewhere: Secondary | ICD-10-CM | POA: Diagnosis present

## 2017-05-25 DIAGNOSIS — I739 Peripheral vascular disease, unspecified: Secondary | ICD-10-CM | POA: Diagnosis not present

## 2017-05-25 DIAGNOSIS — B9561 Methicillin susceptible Staphylococcus aureus infection as the cause of diseases classified elsewhere: Secondary | ICD-10-CM | POA: Diagnosis present

## 2017-05-25 DIAGNOSIS — I509 Heart failure, unspecified: Secondary | ICD-10-CM | POA: Diagnosis present

## 2017-05-25 DIAGNOSIS — I110 Hypertensive heart disease with heart failure: Secondary | ICD-10-CM | POA: Diagnosis present

## 2017-05-25 DIAGNOSIS — Z8614 Personal history of Methicillin resistant Staphylococcus aureus infection: Secondary | ICD-10-CM

## 2017-05-25 DIAGNOSIS — Z95828 Presence of other vascular implants and grafts: Secondary | ICD-10-CM

## 2017-05-25 DIAGNOSIS — E785 Hyperlipidemia, unspecified: Secondary | ICD-10-CM | POA: Diagnosis present

## 2017-05-25 DIAGNOSIS — Z86711 Personal history of pulmonary embolism: Secondary | ICD-10-CM | POA: Diagnosis not present

## 2017-05-25 DIAGNOSIS — Z885 Allergy status to narcotic agent status: Secondary | ICD-10-CM

## 2017-05-25 DIAGNOSIS — Y832 Surgical operation with anastomosis, bypass or graft as the cause of abnormal reaction of the patient, or of later complication, without mention of misadventure at the time of the procedure: Secondary | ICD-10-CM | POA: Diagnosis present

## 2017-05-25 DIAGNOSIS — T827XXA Infection and inflammatory reaction due to other cardiac and vascular devices, implants and grafts, initial encounter: Principal | ICD-10-CM | POA: Diagnosis present

## 2017-05-25 DIAGNOSIS — K449 Diaphragmatic hernia without obstruction or gangrene: Secondary | ICD-10-CM | POA: Diagnosis present

## 2017-05-25 DIAGNOSIS — R7881 Bacteremia: Secondary | ICD-10-CM | POA: Diagnosis present

## 2017-05-25 DIAGNOSIS — Z8673 Personal history of transient ischemic attack (TIA), and cerebral infarction without residual deficits: Secondary | ICD-10-CM | POA: Diagnosis not present

## 2017-05-25 DIAGNOSIS — F17210 Nicotine dependence, cigarettes, uncomplicated: Secondary | ICD-10-CM | POA: Diagnosis present

## 2017-05-25 DIAGNOSIS — M069 Rheumatoid arthritis, unspecified: Secondary | ICD-10-CM | POA: Diagnosis present

## 2017-05-25 DIAGNOSIS — B1920 Unspecified viral hepatitis C without hepatic coma: Secondary | ICD-10-CM | POA: Diagnosis present

## 2017-05-25 DIAGNOSIS — G4730 Sleep apnea, unspecified: Secondary | ICD-10-CM | POA: Diagnosis present

## 2017-05-25 DIAGNOSIS — D62 Acute posthemorrhagic anemia: Secondary | ICD-10-CM | POA: Diagnosis not present

## 2017-05-25 DIAGNOSIS — I5030 Unspecified diastolic (congestive) heart failure: Secondary | ICD-10-CM | POA: Diagnosis not present

## 2017-05-25 DIAGNOSIS — F1190 Opioid use, unspecified, uncomplicated: Secondary | ICD-10-CM | POA: Diagnosis not present

## 2017-05-25 DIAGNOSIS — Z9889 Other specified postprocedural states: Secondary | ICD-10-CM | POA: Diagnosis not present

## 2017-05-25 DIAGNOSIS — Z888 Allergy status to other drugs, medicaments and biological substances status: Secondary | ICD-10-CM

## 2017-05-25 DIAGNOSIS — Z7902 Long term (current) use of antithrombotics/antiplatelets: Secondary | ICD-10-CM

## 2017-05-25 DIAGNOSIS — Z95810 Presence of automatic (implantable) cardiac defibrillator: Secondary | ICD-10-CM

## 2017-05-25 DIAGNOSIS — L02415 Cutaneous abscess of right lower limb: Secondary | ICD-10-CM | POA: Diagnosis present

## 2017-05-25 DIAGNOSIS — F329 Major depressive disorder, single episode, unspecified: Secondary | ICD-10-CM | POA: Diagnosis present

## 2017-05-25 DIAGNOSIS — K0889 Other specified disorders of teeth and supporting structures: Secondary | ICD-10-CM | POA: Diagnosis not present

## 2017-05-25 DIAGNOSIS — Z79899 Other long term (current) drug therapy: Secondary | ICD-10-CM

## 2017-05-25 DIAGNOSIS — F1110 Opioid abuse, uncomplicated: Secondary | ICD-10-CM | POA: Diagnosis present

## 2017-05-25 DIAGNOSIS — Z91013 Allergy to seafood: Secondary | ICD-10-CM

## 2017-05-25 DIAGNOSIS — L03115 Cellulitis of right lower limb: Secondary | ICD-10-CM | POA: Diagnosis present

## 2017-05-25 DIAGNOSIS — J449 Chronic obstructive pulmonary disease, unspecified: Secondary | ICD-10-CM | POA: Diagnosis present

## 2017-05-25 DIAGNOSIS — G894 Chronic pain syndrome: Secondary | ICD-10-CM | POA: Diagnosis present

## 2017-05-25 DIAGNOSIS — Z201 Contact with and (suspected) exposure to tuberculosis: Secondary | ICD-10-CM

## 2017-05-25 DIAGNOSIS — L0390 Cellulitis, unspecified: Secondary | ICD-10-CM | POA: Diagnosis present

## 2017-05-25 DIAGNOSIS — Z8249 Family history of ischemic heart disease and other diseases of the circulatory system: Secondary | ICD-10-CM

## 2017-05-25 DIAGNOSIS — E1151 Type 2 diabetes mellitus with diabetic peripheral angiopathy without gangrene: Secondary | ICD-10-CM | POA: Diagnosis present

## 2017-05-25 DIAGNOSIS — Z978 Presence of other specified devices: Secondary | ICD-10-CM | POA: Diagnosis not present

## 2017-05-25 DIAGNOSIS — Z96662 Presence of left artificial ankle joint: Secondary | ICD-10-CM | POA: Diagnosis present

## 2017-05-25 DIAGNOSIS — Z87898 Personal history of other specified conditions: Secondary | ICD-10-CM

## 2017-05-25 DIAGNOSIS — K219 Gastro-esophageal reflux disease without esophagitis: Secondary | ICD-10-CM | POA: Diagnosis present

## 2017-05-25 DIAGNOSIS — Z881 Allergy status to other antibiotic agents status: Secondary | ICD-10-CM

## 2017-05-25 DIAGNOSIS — F1191 Opioid use, unspecified, in remission: Secondary | ICD-10-CM

## 2017-05-25 DIAGNOSIS — T82898A Other specified complication of vascular prosthetic devices, implants and grafts, initial encounter: Secondary | ICD-10-CM | POA: Diagnosis not present

## 2017-05-25 DIAGNOSIS — Z951 Presence of aortocoronary bypass graft: Secondary | ICD-10-CM | POA: Diagnosis not present

## 2017-05-25 DIAGNOSIS — Z981 Arthrodesis status: Secondary | ICD-10-CM

## 2017-05-25 HISTORY — DX: Cutaneous abscess of right lower limb: L02.415

## 2017-05-25 HISTORY — DX: Cellulitis of right lower limb: L03.115

## 2017-05-25 LAB — COMPREHENSIVE METABOLIC PANEL
ALBUMIN: 3.4 g/dL — AB (ref 3.5–5.0)
ALT: 21 U/L (ref 17–63)
ANION GAP: 9 (ref 5–15)
AST: 25 U/L (ref 15–41)
Alkaline Phosphatase: 70 U/L (ref 38–126)
BUN: 12 mg/dL (ref 6–20)
CHLORIDE: 94 mmol/L — AB (ref 101–111)
CO2: 27 mmol/L (ref 22–32)
CREATININE: 0.9 mg/dL (ref 0.61–1.24)
Calcium: 8.9 mg/dL (ref 8.9–10.3)
GFR calc non Af Amer: >60 mL/min (ref >60)
Glucose, Bld: 141 mg/dL — ABNORMAL HIGH (ref 65–99)
Potassium: 4.3 mmol/L (ref 3.5–5.1)
SODIUM: 130 mmol/L — AB (ref 135–145)
Total Bilirubin: 1.1 mg/dL (ref 0.3–1.2)
Total Protein: 7.9 g/dL (ref 6.5–8.1)

## 2017-05-25 LAB — CBC WITH DIFFERENTIAL/PLATELET
ABS IMMATURE GRANULOCYTES: 0 10*3/uL (ref 0.0–0.1)
Basophils Absolute: 0 10*3/uL (ref 0.0–0.1)
Basophils Relative: 1 %
Eosinophils Absolute: 0.1 10*3/uL (ref 0.0–0.7)
Eosinophils Relative: 1 %
HEMATOCRIT: 39.3 % (ref 39.0–52.0)
Hemoglobin: 12.6 g/dL — ABNORMAL LOW (ref 13.0–17.0)
Immature Granulocytes: 0 %
LYMPHS ABS: 1 10*3/uL (ref 0.7–4.0)
Lymphocytes Relative: 13 %
MCH: 27.2 pg (ref 26.0–34.0)
MCHC: 32.1 g/dL (ref 30.0–36.0)
MCV: 84.9 fL (ref 78.0–100.0)
MONOS PCT: 11 %
Monocytes Absolute: 0.8 10*3/uL (ref 0.1–1.0)
NEUTROS ABS: 5.8 10*3/uL (ref 1.7–7.7)
Neutrophils Relative %: 74 %
Platelets: 204 10*3/uL (ref 150–400)
RBC: 4.63 MIL/uL (ref 4.22–5.81)
RDW: 14.7 % (ref 11.5–15.5)
WBC: 7.8 10*3/uL (ref 4.0–10.5)

## 2017-05-25 LAB — CBC
HEMATOCRIT: 36.5 % — AB (ref 39.0–52.0)
HEMOGLOBIN: 11.6 g/dL — AB (ref 13.0–17.0)
MCH: 27.2 pg (ref 26.0–34.0)
MCHC: 31.8 g/dL (ref 30.0–36.0)
MCV: 85.7 fL (ref 78.0–100.0)
Platelets: 167 10*3/uL (ref 150–400)
RBC: 4.26 MIL/uL (ref 4.22–5.81)
RDW: 14.8 % (ref 11.5–15.5)
WBC: 5.3 10*3/uL (ref 4.0–10.5)

## 2017-05-25 LAB — CREATININE, SERUM
CREATININE: 1.07 mg/dL (ref 0.61–1.24)
GFR calc Af Amer: >60 mL/min (ref >60)
GFR calc non Af Amer: >60 mL/min (ref >60)

## 2017-05-25 LAB — I-STAT CG4 LACTIC ACID, ED
LACTIC ACID, VENOUS: 1.25 mmol/L (ref 0.5–1.9)
Lactic Acid, Venous: 1.31 mmol/L (ref 0.5–1.9)

## 2017-05-25 LAB — PROTIME-INR
INR: 1.32
Prothrombin Time: 16.3 seconds — ABNORMAL HIGH (ref 11.4–15.2)

## 2017-05-25 MED ORDER — ONDANSETRON HCL 4 MG PO TABS: 4.0000 mg | ORAL_TABLET | Freq: Three times a day (TID) | ORAL | Status: DC | PRN

## 2017-05-25 MED ORDER — HYDROXYZINE PAMOATE 25 MG PO CAPS
25.0000 mg | ORAL_CAPSULE | Freq: Two times a day (BID) | ORAL | Status: DC
  Filled 2017-05-25: qty 1

## 2017-05-25 MED ORDER — CLOPIDOGREL BISULFATE 75 MG PO TABS
75.0000 mg | ORAL_TABLET | Freq: Every day | ORAL | Status: DC
  Administered 2017-05-26 – 2017-06-03 (×9): 75 mg via ORAL
  Filled 2017-05-25 (×9): qty 1

## 2017-05-25 MED ORDER — GUAIFENESIN-DM 100-10 MG/5ML PO SYRP: 15.0000 mL | ORAL_SOLUTION | ORAL | Status: DC | PRN

## 2017-05-25 MED ORDER — POTASSIUM CHLORIDE CRYS ER 20 MEQ PO TBCR
20.0000 meq | EXTENDED_RELEASE_TABLET | Freq: Once | ORAL | Status: DC
Start: 2017-05-25 — End: 2017-05-28

## 2017-05-25 MED ORDER — ALBUTEROL SULFATE (2.5 MG/3ML) 0.083% IN NEBU: 2.5000 mg | INHALATION_SOLUTION | RESPIRATORY_TRACT | Status: DC | PRN

## 2017-05-25 MED ORDER — LORATADINE 10 MG PO TABS: 10.0000 mg | ORAL_TABLET | Freq: Every day | ORAL | Status: DC

## 2017-05-25 MED ORDER — PREGABALIN 75 MG PO CAPS
75.0000 mg | ORAL_CAPSULE | Freq: Two times a day (BID) | ORAL | Status: DC
  Administered 2017-05-25 – 2017-06-03 (×19): 75 mg via ORAL
  Filled 2017-05-25 (×19): qty 1

## 2017-05-25 MED ORDER — METOPROLOL TARTRATE 5 MG/5ML IV SOLN: 2.0000 mg | INTRAVENOUS | Status: DC | PRN

## 2017-05-25 MED ORDER — ALBUTEROL SULFATE (2.5 MG/3ML) 0.083% IN NEBU: 2.5000 mg | INHALATION_SOLUTION | Freq: Four times a day (QID) | RESPIRATORY_TRACT | Status: DC | PRN

## 2017-05-25 MED ORDER — ONDANSETRON 4 MG PO TBDP
4.0000 mg | ORAL_TABLET | Freq: Once | ORAL | Status: AC | PRN
  Administered 2017-05-25: 4 mg via ORAL
  Filled 2017-05-25: qty 1

## 2017-05-25 MED ORDER — CEFAZOLIN SODIUM-DEXTROSE 1-4 GM/50ML-% IV SOLN: 1.0000 g | Freq: Three times a day (TID) | INTRAVENOUS | Status: DC

## 2017-05-25 MED ORDER — VANCOMYCIN HCL IN DEXTROSE 1-5 GM/200ML-% IV SOLN: 1000.0000 mg | INTRAVENOUS | Status: DC

## 2017-05-25 MED ORDER — HYDROMORPHONE HCL 1 MG/ML IJ SOLN
1.0000 mg | INTRAMUSCULAR | Status: DC | PRN
  Administered 2017-05-25 – 2017-05-27 (×9): 1 mg via INTRAVENOUS
  Filled 2017-05-25 (×9): qty 1

## 2017-05-25 MED ORDER — HEPARIN SODIUM (PORCINE) 5000 UNIT/ML IJ SOLN
5000.0000 [IU] | Freq: Three times a day (TID) | INTRAMUSCULAR | Status: DC
  Administered 2017-05-25 – 2017-05-28 (×8): 5000 [IU] via SUBCUTANEOUS
  Filled 2017-05-25 (×8): qty 1

## 2017-05-25 MED ORDER — PANTOPRAZOLE SODIUM 40 MG PO TBEC
40.0000 mg | DELAYED_RELEASE_TABLET | Freq: Every day | ORAL | Status: DC
  Administered 2017-05-26 – 2017-05-28 (×3): 40 mg via ORAL
  Filled 2017-05-25 (×3): qty 1

## 2017-05-25 MED ORDER — PIPERACILLIN-TAZOBACTAM 3.375 G IVPB
3.3750 g | Freq: Three times a day (TID) | INTRAVENOUS | Status: DC
  Administered 2017-05-25 – 2017-05-26 (×3): 3.375 g via INTRAVENOUS
  Filled 2017-05-25 (×5): qty 50

## 2017-05-25 MED ORDER — ALUM & MAG HYDROXIDE-SIMETH 200-200-20 MG/5ML PO SUSP: 15.0000 mL | ORAL | Status: DC | PRN

## 2017-05-25 MED ORDER — PANTOPRAZOLE SODIUM 20 MG PO TBEC: 20.0000 mg | DELAYED_RELEASE_TABLET | Freq: Every day | ORAL | Status: DC

## 2017-05-25 MED ORDER — HYDROMORPHONE HCL 1 MG/ML IJ SOLN
0.5000 mg | INTRAMUSCULAR | Status: AC | PRN
  Administered 2017-05-25: 0.5 mg via INTRAVENOUS
  Filled 2017-05-25: qty 1

## 2017-05-25 MED ORDER — PHENOL 1.4 % MT LIQD: 1.0000 | OROMUCOSAL | Status: DC | PRN

## 2017-05-25 MED ORDER — LABETALOL HCL 5 MG/ML IV SOLN: 10.0000 mg | INTRAVENOUS | Status: DC | PRN

## 2017-05-25 MED ORDER — ONDANSETRON HCL 4 MG/2ML IJ SOLN
4.0000 mg | Freq: Once | INTRAMUSCULAR | Status: AC
  Administered 2017-05-25: 4 mg via INTRAVENOUS
  Filled 2017-05-25: qty 2

## 2017-05-25 MED ORDER — OXYCODONE-ACETAMINOPHEN 7.5-325 MG PO TABS
1.0000 | ORAL_TABLET | Freq: Three times a day (TID) | ORAL | Status: DC | PRN
  Administered 2017-05-25 – 2017-05-28 (×8): 1 via ORAL
  Filled 2017-05-25 (×9): qty 1

## 2017-05-25 MED ORDER — FLUTICASONE PROPIONATE 50 MCG/ACT NA SUSP: 2.0000 | Freq: Every day | NASAL | Status: DC

## 2017-05-25 MED ORDER — HYDRALAZINE HCL 20 MG/ML IJ SOLN: 5.0000 mg | INTRAMUSCULAR | Status: DC | PRN

## 2017-05-25 MED ORDER — ONDANSETRON HCL 4 MG/2ML IJ SOLN
4.0000 mg | Freq: Four times a day (QID) | INTRAMUSCULAR | Status: DC | PRN
  Administered 2017-05-27: 4 mg via INTRAVENOUS
  Filled 2017-05-25: qty 2

## 2017-05-25 MED ORDER — HYDROXYZINE HCL 25 MG PO TABS
25.0000 mg | ORAL_TABLET | Freq: Two times a day (BID) | ORAL | Status: DC
  Administered 2017-05-25 – 2017-06-03 (×19): 25 mg via ORAL
  Filled 2017-05-25 (×19): qty 1

## 2017-05-25 MED ORDER — ACETAMINOPHEN 325 MG PO TABS
650.0000 mg | ORAL_TABLET | Freq: Four times a day (QID) | ORAL | Status: DC | PRN
  Administered 2017-05-25 – 2017-05-27 (×2): 650 mg via ORAL
  Filled 2017-05-25 (×2): qty 2

## 2017-05-25 MED ORDER — HYDROMORPHONE HCL 2 MG/ML IJ SOLN
0.5000 mg | INTRAMUSCULAR | Status: DC | PRN
  Administered 2017-05-25: 0.5 mg via INTRAVENOUS
  Filled 2017-05-25: qty 1

## 2017-05-25 MED ORDER — SODIUM CHLORIDE 0.9 % IV SOLN
INTRAVENOUS | Status: DC
  Administered 2017-05-25: 16:00:00 via INTRAVENOUS

## 2017-05-25 MED ORDER — HYDROMORPHONE HCL 1 MG/ML IJ SOLN: 0.5000 mg | INTRAMUSCULAR | Status: DC | PRN

## 2017-05-25 NOTE — ED Notes (Signed)
Pt refusing cardiac monitoring. 
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
## 2017-05-25 NOTE — ED Notes (Addendum)
Pt removed all his monitoring equipment, got dressed and left the room. Primary RN was in another Pt's room trying to start an IV. Brooke Sport and exercise psychologist asked Pt to go back in room but Pt refused, Said "Im in pain and I am going back out to the lobby". Pt demanding more pain medication.  Another EMT and RN found Pt in [place] and escorted Pt back to room. Pt wishes to leave.  Admitting will be paged. Pt ambulatory with steady gait.
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
## 2017-05-25 NOTE — ED Notes (Signed)
Found patient walking up B hallway. Noticed patient had an IV in. Walked with patient asking if they could step back in their room. Patient refused, saying his leg hurt too much and that he needed to walk. EMT explained to patient that patient needs to stay in room. Walked patient back to room. Patient being noncompliant with RN when returning to room.

## 2017-05-25 NOTE — Progress Notes (Signed)
@  approx. 2020 pt endorsed he would leave AMA if he didn't receive more pain medication. Dr. Scot Dock paged and order received for IV Dilaudid. Medication administered; will continue to monitor.

## 2017-05-25 NOTE — ED Notes (Signed)
Pt standing up eating lunch. Pt asked to sit back in bed to get pain meds. Pt says he is more comfortable like this. Waiting for Pt to comply to give meds.

## 2017-05-26 ENCOUNTER — Encounter (HOSPITAL_COMMUNITY): Payer: Self-pay

## 2017-05-26 ENCOUNTER — Inpatient Hospital Stay (HOSPITAL_COMMUNITY): Payer: Medicaid Other

## 2017-05-26 ENCOUNTER — Other Ambulatory Visit: Payer: Self-pay

## 2017-05-26 DIAGNOSIS — F17210 Nicotine dependence, cigarettes, uncomplicated: Secondary | ICD-10-CM

## 2017-05-26 DIAGNOSIS — B9561 Methicillin susceptible Staphylococcus aureus infection as the cause of diseases classified elsewhere: Secondary | ICD-10-CM

## 2017-05-26 DIAGNOSIS — F1110 Opioid abuse, uncomplicated: Secondary | ICD-10-CM

## 2017-05-26 DIAGNOSIS — E1151 Type 2 diabetes mellitus with diabetic peripheral angiopathy without gangrene: Secondary | ICD-10-CM

## 2017-05-26 DIAGNOSIS — Z951 Presence of aortocoronary bypass graft: Secondary | ICD-10-CM

## 2017-05-26 DIAGNOSIS — L02415 Cutaneous abscess of right lower limb: Secondary | ICD-10-CM

## 2017-05-26 DIAGNOSIS — B1920 Unspecified viral hepatitis C without hepatic coma: Secondary | ICD-10-CM

## 2017-05-26 DIAGNOSIS — Z9889 Other specified postprocedural states: Secondary | ICD-10-CM

## 2017-05-26 DIAGNOSIS — R7881 Bacteremia: Secondary | ICD-10-CM

## 2017-05-26 HISTORY — DX: Cutaneous abscess of right lower limb: L02.415

## 2017-05-26 LAB — CBC
HCT: 35 % — ABNORMAL LOW (ref 39.0–52.0)
Hemoglobin: 11.2 g/dL — ABNORMAL LOW (ref 13.0–17.0)
MCH: 27.7 pg (ref 26.0–34.0)
MCHC: 32 g/dL (ref 30.0–36.0)
MCV: 86.4 fL (ref 78.0–100.0)
PLATELETS: 174 10*3/uL (ref 150–400)
RBC: 4.05 MIL/uL — AB (ref 4.22–5.81)
RDW: 14.8 % (ref 11.5–15.5)
WBC: 6 10*3/uL (ref 4.0–10.5)

## 2017-05-26 LAB — BASIC METABOLIC PANEL
Anion gap: 7 (ref 5–15)
BUN: 16 mg/dL (ref 6–20)
CHLORIDE: 100 mmol/L — AB (ref 101–111)
CO2: 27 mmol/L (ref 22–32)
Calcium: 8.2 mg/dL — ABNORMAL LOW (ref 8.9–10.3)
Creatinine, Ser: 1 mg/dL (ref 0.61–1.24)
GFR calc Af Amer: >60 mL/min (ref >60)
Glucose, Bld: 196 mg/dL — ABNORMAL HIGH (ref 65–99)
POTASSIUM: 3.9 mmol/L (ref 3.5–5.1)
Sodium: 134 mmol/L — ABNORMAL LOW (ref 135–145)

## 2017-05-26 MED ORDER — CEFAZOLIN SODIUM-DEXTROSE 2-4 GM/100ML-% IV SOLN
2.0000 g | Freq: Three times a day (TID) | INTRAVENOUS | Status: AC
  Administered 2017-05-26 – 2017-06-02 (×22): 2 g via INTRAVENOUS
  Filled 2017-05-26 (×24): qty 100

## 2017-05-26 NOTE — Progress Notes (Addendum)
  Progress Note    05/26/2017 8:09 AM  Subjective:  Patient in severe pain lateral R leg at area of redness and swelling   Vitals:   05/26/17 0040 05/26/17 0351  BP:  127/71  Pulse: 93 91  Resp: 19 16  Temp: 98 F (36.7 C) 98.9 F (37.2 C)  SpO2: 100% 100%   Physical Exam: Lungs:  Non labored Extremities:  Palpable graft pulse in lateral leg; erythema and local edema lateral thigh at site of drug use; no area of fluctuance or abscess palpable Abdomen:  Soft Neurologic: A&O  CBC    Component Value Date/Time   WBC 5.3 05/25/2017 1651   RBC 4.26 05/25/2017 1651   HGB 11.6 (L) 05/25/2017 1651   HCT 36.5 (L) 05/25/2017 1651   PLT 167 05/25/2017 1651   MCV 85.7 05/25/2017 1651   MCH 27.2 05/25/2017 1651   MCHC 31.8 05/25/2017 1651   RDW 14.8 05/25/2017 1651   LYMPHSABS 1.0 05/25/2017 0832   MONOABS 0.8 05/25/2017 0832   EOSABS 0.1 05/25/2017 0832   BASOSABS 0.0 05/25/2017 0832    BMET    Component Value Date/Time   NA 130 (L) 05/25/2017 0832   K 4.3 05/25/2017 0832   CL 94 (L) 05/25/2017 0832   CO2 27 05/25/2017 0832   GLUCOSE 141 (H) 05/25/2017 0832   BUN 12 05/25/2017 0832   CREATININE 1.07 05/25/2017 1651   CREATININE 0.85 05/10/2014 1114   CALCIUM 8.9 05/25/2017 0832   CALCIUM 9.1 10/28/2009 2118   GFRNONAA >60 05/25/2017 1651   GFRNONAA >89 02/01/2014 1518   GFRAA >60 05/25/2017 1651   GFRAA >89 02/01/2014 1518    INR    Component Value Date/Time   INR 1.32 05/25/2017 1651     Intake/Output Summary (Last 24 hours) at 05/26/2017 0809 Last data filed at 05/26/2017 0700 Gross per 24 hour  Intake 1674.17 ml  Output 500 ml  Net 1174.17 ml     Assessment/Plan:  57 y.o. male with cellulitis R lateral thigh; s/p I&D 03/2017  Continue IV antibiotics Graft duplex pending Patent bypass with palpable graft pulse Pain medicine on schedule Further recommendations following graft duplex   Dagoberto Ligas, PA-C [facility] [phone] 05/26/2017 8:09 AM  I have examined the patient, reviewed and agree with above.  Reviewed the duplex images.  Does appear to have an outpouching at the area of concern regarding the outer portion of the femoral to anterior tibial bypass at the lateral distal thigh.  Does have significant tenderness and erythema over this and some serous weeping from the incision at this area.  Will need exploration.  Preferably would revise this area but very limited amount of vein for bypass left.  Discussed that this is certainly limb threatening if we cannot control the infection of his bypass.  Curt Jews, MD 05/26/2017 4:32 PM

## 2017-05-26 NOTE — Progress Notes (Addendum)
Preliminary results by tech - Right lower ext. Arterial duplex completed. A patent right femora-tibial bypass with no evidence of stenosis. There is an outpouching involving the mid to distal graft in the lateral thigh area at the site of concern without evidence of fluid or abnormal flow. Oda Cogan, BS, RDMS, RVT
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
## 2017-05-26 NOTE — Progress Notes (Signed)
PHARMACY - PHYSICIAN COMMUNICATION CRITICAL VALUE ALERT - BLOOD CULTURE IDENTIFICATION (BCID)  [name] is an 57 y.o. male who presented to [facility] on 05/25/2017 with a chief complaint of RLE cellulitis   Assessment:  Pt now has 2/2 blood cultures growing gram positive cocci. BCID identified MSSA. Of note, patient has a known h/o IVDA.   Name of physician (or Provider) Contacted: Dagoberto Ligas, PA-C  Current antibiotics: Zosyn 3.375 gm IV Q 8 hours (EI infusion)  Changes to prescribed antibiotics recommended: Switch antibiotics to IV nafcillin and consult ID.   Response not received from provider;  current antibiotics are likely to cover the isolated organism.  Consider de-escalation soon. Will f/u with team in AM  Results for orders placed or performed during the hospital encounter of 05/25/17  Blood Culture ID Panel (Reflexed) (Collected: 05/25/2017  8:22 PM)  Result Value Ref Range   Enterococcus species NOT DETECTED NOT DETECTED   Listeria monocytogenes NOT DETECTED NOT DETECTED   Staphylococcus species DETECTED (A) NOT DETECTED   Staphylococcus aureus DETECTED (A) NOT DETECTED   Methicillin resistance NOT DETECTED NOT DETECTED   Streptococcus species DETECTED (A) NOT DETECTED   Streptococcus agalactiae NOT DETECTED NOT DETECTED   Streptococcus pneumoniae NOT DETECTED NOT DETECTED   Streptococcus pyogenes NOT DETECTED NOT DETECTED   Acinetobacter baumannii NOT DETECTED NOT DETECTED   Enterobacteriaceae species NOT DETECTED NOT DETECTED   Enterobacter cloacae complex NOT DETECTED NOT DETECTED   Escherichia coli NOT DETECTED NOT DETECTED   Klebsiella oxytoca NOT DETECTED NOT DETECTED   Klebsiella pneumoniae NOT DETECTED NOT DETECTED   Proteus species NOT DETECTED NOT DETECTED   Serratia marcescens NOT DETECTED NOT DETECTED   Haemophilus influenzae NOT DETECTED NOT DETECTED   Neisseria meningitidis NOT DETECTED NOT DETECTED   Pseudomonas aeruginosa NOT DETECTED NOT  DETECTED   Candida albicans NOT DETECTED NOT DETECTED   Candida glabrata NOT DETECTED NOT DETECTED   Candida krusei NOT DETECTED NOT DETECTED   Candida parapsilosis NOT DETECTED NOT DETECTED   Candida tropicalis NOT DETECTED NOT DETECTED    Albertina Parr, PharmD., BCPS Clinical Pharmacist Clinical phone for 05/26/17 until 3:30pm: [phone] If after 3:30pm, please call main pharmacy at: [phone]

## 2017-05-26 NOTE — Progress Notes (Signed)
Inpatient Diabetes Program Recommendations  AACE/ADA: New Consensus Statement on Inpatient Glycemic Control (2015)  Target Ranges:  Prepandial:   less than 140 mg/dL      Peak postprandial:   less than 180 mg/dL (1-2 hours)      Critically ill patients:  140 - 180 mg/dL   Results for MAYFIELD, SCHOENE (MRN [id number]) as of 05/26/2017 11:02  Ref. Range 05/25/2017 08:32 05/26/2017 08:35  Glucose Latest Ref Range: 65 - 99 mg/dL 141 (H) 196 (H)   Review of Glycemic Control  Diabetes history: None  Inpatient Diabetes Program Recommendations:    Lab glucose this am 196 mg/dl. Consider an A1c level. Consider CBG checks and possibly Novolog Sensitive Correction 0-9 units tid.   Thanks,  Tama Headings RN, MSN, BC-ADM, [facility] Inpatient Diabetes Coordinator Team Pager [phone] (8a-5p)

## 2017-05-27 ENCOUNTER — Encounter (HOSPITAL_COMMUNITY): Payer: Self-pay | Admitting: General Practice

## 2017-05-27 DIAGNOSIS — T827XXA Infection and inflammatory reaction due to other cardiac and vascular devices, implants and grafts, initial encounter: Principal | ICD-10-CM

## 2017-05-27 DIAGNOSIS — L03115 Cellulitis of right lower limb: Secondary | ICD-10-CM

## 2017-05-27 DIAGNOSIS — B955 Unspecified streptococcus as the cause of diseases classified elsewhere: Secondary | ICD-10-CM

## 2017-05-27 DIAGNOSIS — K0889 Other specified disorders of teeth and supporting structures: Secondary | ICD-10-CM

## 2017-05-27 LAB — HIV ANTIBODY (ROUTINE TESTING W REFLEX): HIV SCREEN 4TH GENERATION: NONREACTIVE

## 2017-05-27 MED ORDER — RIFAMPIN 300 MG PO CAPS
300.0000 mg | ORAL_CAPSULE | Freq: Two times a day (BID) | ORAL | Status: DC
  Administered 2017-05-27 – 2017-06-03 (×16): 300 mg via ORAL
  Filled 2017-05-27 (×17): qty 1

## 2017-05-27 MED ORDER — HYDROMORPHONE HCL 1 MG/ML IJ SOLN
1.0000 mg | INTRAMUSCULAR | Status: DC | PRN
Start: 2017-05-27 — End: 2017-06-04
  Administered 2017-05-27 – 2017-06-04 (×58): 1 mg via INTRAVENOUS
  Filled 2017-05-27 (×58): qty 1

## 2017-05-27 MED ORDER — NICOTINE 21 MG/24HR TD PT24
21.0000 mg | MEDICATED_PATCH | Freq: Every day | TRANSDERMAL | Status: DC
Start: 2017-05-27 — End: 2017-06-04
  Administered 2017-05-27 – 2017-06-03 (×8): 21 mg via TRANSDERMAL
  Filled 2017-05-27 (×8): qty 1

## 2017-05-27 NOTE — Progress Notes (Addendum)
Found patient standing in room bleeding from right lateral thigh. Pressure applied and site dressed with gauze and ace wrap. Room smelled of cigarette smoke and patient admitted to smoking in bathroom. Cigarettes and lighter confiscated. PA notified and aware.

## 2017-05-27 NOTE — Progress Notes (Signed)
Subjective: Interval History: none.. Sleeping when I enter the room.  When awake in the complains of severe ongoing pain  Objective: Vital signs in last 24 hours: Temp:  [98.6 F (37 C)-99.5 F (37.5 C)] 99.3 F (37.4 C) (05/16 0319) Pulse Rate:  [76-109] 109 (05/15 2011) Resp:  [19] 19 (05/15 1220) BP: (99-130)/(48-73) 130/73 (05/16 0319) SpO2:  [99 %-100 %] 99 % (05/15 2011)  Intake/Output from previous day: 05/15 0701 - 05/16 0700 In: 125 [P.O.:125] Out: -  Intake/Output this shift: No intake/output data recorded.  Progressive changes in the right lateral thigh.  Now with some fluctuance around his graft.  Tenderness.  Graft patent  Lab Results: Recent Labs    05/25/17 1651 05/26/17 0835  WBC 5.3 6.0  HGB 11.6* 11.2*  HCT 36.5* 35.0*  PLT 167 174   BMET Recent Labs    05/25/17 0832 05/25/17 1651 05/26/17 0835  NA 130*  --  134*  K 4.3  --  3.9  CL 94*  --  100*  CO2 27  --  27  GLUCOSE 141*  --  196*  BUN 12  --  16  CREATININE 0.90 1.07 1.00  CALCIUM 8.9  --  8.2*    Studies/Results: No results found. Anti-infectives: Anti-infectives (From admission, onward)   Start     Dose/Rate Route Frequency Ordered Stop   05/26/17 2200  ceFAZolin (ANCEF) IVPB 2g/100 mL premix     2 g 200 mL/hr over 30 Minutes Intravenous Every 8 hours 05/26/17 1712     05/26/17 0000  vancomycin (VANCOCIN) IVPB 1000 mg/200 mL premix  Status:  Discontinued     1,000 mg 200 mL/hr over 60 Minutes Intravenous To Surgery 05/25/17 1615 05/25/17 1620   05/25/17 2200  piperacillin-tazobactam (ZOSYN) IVPB 3.375 g  Status:  Discontinued     3.375 g 12.5 mL/hr over 240 Minutes Intravenous Every 8 hours 05/25/17 1615 05/26/17 1712   05/25/17 1615  ceFAZolin (ANCEF) IVPB 1 g/50 mL premix  Status:  Discontinued     1 g 100 mL/hr over 30 Minutes Intravenous Every 8 hours 05/25/17 1605 05/25/17 1621      Assessment/Plan: s/p Procedure(s): BYPASS GRAFT FEMORAL-ANTERIOR TIBIAL ARTERY  REVISION RIGHT LEG (Right) Very difficult management problem.  Infection around his femoral to anterior tibial vein graft.  Very limited vein for autogenous bypass.  Will take to the operating room tomorrow for debridement.  Will probably require revision around the area of infection.  Explained to the patient that we would image small saphenous vein and also potential arm vein.  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  Explained the significant risk of limb loss related to this process.  Pain management a very difficult issue due to his long narcotic abuse   LOS: 2 days   [name] 05/27/2017, 6:55 AM

## 2017-05-27 NOTE — Care Management Note (Signed)
Case Management Note Marvetta Gibbons RN, BSN Unit 4E-Case Manager [phone]  Patient Details  Name: HAWLEY MICHEL MRN: [id number] Date of Birth: 11-12-1960  Subjective/Objective:  Pt admitted with cellulitis - infected bypass graft. Plan for I&D on 5/17                 Action/Plan: PTA pt lived at home- hx of IVDU, CM to follow for transition of care needs post I&D  Expected Discharge Date:                  Expected Discharge Plan:  [place]  In-House Referral:     Discharge planning Services  CM Consult  Post Acute Care Choice:    Choice offered to:     DME Arranged:    DME Agency:     HH Arranged:    HH Agency:     Status of Service:  In process, will continue to follow  If discussed at Long Length of Stay Meetings, dates discussed:    Discharge Disposition:   Additional Comments:  Dawayne Patricia, RN 05/27/2017, 11:22 AM

## 2017-05-27 NOTE — Progress Notes (Signed)
Dr. Bridgett Larsson and I rounded on [name] this evening.  Dressing removed and no active bleeding was noted.  Area of hematoma non pulsatile.  Gauze, abd pad, and ace wrap re-applied.  Recommended bed rest overnight to the patient as a precautionary measure.  Patient agreed.  He is scheduled for debridement and revision in OR tomorrow with Dr. Donnetta Hutching.  NPO after midnight.  Dagoberto Ligas, PA-C [facility] [phone] 05/27/2017  7:04 PM

## 2017-05-27 NOTE — H&P (View-Only) (Signed)
Subjective: Interval History: none.. Sleeping when I enter the room.  When awake in the complains of severe ongoing pain  Objective: Vital signs in last 24 hours: Temp:  [98.6 F (37 C)-99.5 F (37.5 C)] 99.3 F (37.4 C) (05/16 0319) Pulse Rate:  [76-109] 109 (05/15 2011) Resp:  [19] 19 (05/15 1220) BP: (99-130)/(48-73) 130/73 (05/16 0319) SpO2:  [99 %-100 %] 99 % (05/15 2011)  Intake/Output from previous day: 05/15 0701 - 05/16 0700 In: 125 [P.O.:125] Out: -  Intake/Output this shift: No intake/output data recorded.  Progressive changes in the right lateral thigh.  Now with some fluctuance around his graft.  Tenderness.  Graft patent  Lab Results: Recent Labs    05/25/17 1651 05/26/17 0835  WBC 5.3 6.0  HGB 11.6* 11.2*  HCT 36.5* 35.0*  PLT 167 174   BMET Recent Labs    05/25/17 0832 05/25/17 1651 05/26/17 0835  NA 130*  --  134*  K 4.3  --  3.9  CL 94*  --  100*  CO2 27  --  27  GLUCOSE 141*  --  196*  BUN 12  --  16  CREATININE 0.90 1.07 1.00  CALCIUM 8.9  --  8.2*    Studies/Results: No results found. Anti-infectives: Anti-infectives (From admission, onward)   Start     Dose/Rate Route Frequency Ordered Stop   05/26/17 2200  ceFAZolin (ANCEF) IVPB 2g/100 mL premix     2 g 200 mL/hr over 30 Minutes Intravenous Every 8 hours 05/26/17 1712     05/26/17 0000  vancomycin (VANCOCIN) IVPB 1000 mg/200 mL premix  Status:  Discontinued     1,000 mg 200 mL/hr over 60 Minutes Intravenous To Surgery 05/25/17 1615 05/25/17 1620   05/25/17 2200  piperacillin-tazobactam (ZOSYN) IVPB 3.375 g  Status:  Discontinued     3.375 g 12.5 mL/hr over 240 Minutes Intravenous Every 8 hours 05/25/17 1615 05/26/17 1712   05/25/17 1615  ceFAZolin (ANCEF) IVPB 1 g/50 mL premix  Status:  Discontinued     1 g 100 mL/hr over 30 Minutes Intravenous Every 8 hours 05/25/17 1605 05/25/17 1621      Assessment/Plan: s/p Procedure(s): BYPASS GRAFT FEMORAL-ANTERIOR TIBIAL ARTERY  REVISION RIGHT LEG (Right) Very difficult management problem.  Infection around his femoral to anterior tibial vein graft.  Very limited vein for autogenous bypass.  Will take to the operating room tomorrow for debridement.  Will probably require revision around the area of infection.  Explained to the patient that we would image small saphenous vein and also potential arm vein.  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  Explained the significant risk of limb loss related to this process.  Pain management a very difficult issue due to his long narcotic abuse   LOS: 2 days   [name] Early 05/27/2017, 6:55 AM

## 2017-05-27 NOTE — Progress Notes (Signed)
    [facility] for Infectious Disease    Date of Admission:  05/25/2017   Total days of antibiotics 3        Day 2 cefazolin        Day 1 rif           ID: [name] is a 57 y.o. 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   Medications:  . clopidogrel  75 mg Oral Daily  . heparin  5,000 Units Subcutaneous Q8H  . hydrOXYzine  25 mg Oral BID  . nicotine  21 mg Transdermal Daily  . pantoprazole  40 mg Oral Daily  . potassium chloride  20-40 mEq Oral Once  . pregabalin  75 mg Oral BID  . rifampin  300 mg Oral Q12H    Objective: Vital signs in last 24 hours: Temp:  [99.3 F (37.4 C)-101.5 F (38.6 C)] 101.5 F (38.6 C) (05/16 1135) Pulse Rate:  [109] 109 (05/16 1135) Resp:  [19] 19 (05/16 1135) BP: (130-149)/(73-77) 149/77 (05/16 1135) SpO2:  [98 %-99 %] 98 % (05/16 1135) Physical Exam  Constitutional: He is oriented to person, place, and time. He appears older than stated age and mal-nourished. No distress.  HENT: poor dentition Mouth/Throat: Oropharynx is clear and moist. No oropharyngeal exudate.  Cardiovascular: Normal rate, regular rhythm and normal heart sounds. Exam reveals no gallop and no friction rub.  No murmur heard.  Pulmonary/Chest: Effort normal and breath sounds normal. No respiratory distress. He has no wheezes.  Abdominal: Soft. Bowel sounds are normal. He exhibits no distension. There is no tenderness.  FBP:ZWCHE thigh erythema, warm to touch, wrapped Psychiatric: tearful.    Lab Results Recent Labs    05/25/17 0832 05/25/17 1651 05/26/17 0835  WBC 7.8 5.3 6.0  HGB 12.6* 11.6* 11.2*  HCT 39.3 36.5* 35.0*  NA 130*  --  134*  K 4.3  --  3.9  CL 94*  --  100*  CO2 27  --  27  BUN 12  --  16  CREATININE 0.90 1.07 1.00   Liver Panel Recent Labs    05/25/17 0832    PROT 7.9  ALBUMIN 3.4*  AST 25  ALT 21  ALKPHOS 70  BILITOT 1.1    Microbiology: 5/14 blood cx staph aureus, and strep species 5/14 blood cx staph aureus 5/15 blood cx pending Studies/Results: No results found.  March 2019 cT of thigh -Cutaneous and subcutaneous stranding is seen along the anterior, lateral aspect of the upper leg. A focal fluid collection measuring 3.0 cm AP x 1.5 cm transverse by 7.5 cm craniocaudal surrounds the patient's arterial graft from the femoral artery.  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 Patient is set for debridement tomorrow. Recommend to try to remove as much as of infected material out as possible.  - continue on cefazolin and rifampin - likely will need extended course of treatment - please also get TTE  [facility] for Infectious Diseases Cell: [phone] Pager: [phone]  05/27/2017, 2:38 PM

## 2017-05-28 ENCOUNTER — Inpatient Hospital Stay (HOSPITAL_COMMUNITY): Payer: Medicaid Other

## 2017-05-28 ENCOUNTER — Encounter (HOSPITAL_COMMUNITY): Payer: Self-pay | Admitting: Critical Care Medicine

## 2017-05-28 ENCOUNTER — Encounter (HOSPITAL_COMMUNITY)
Admission: EM | Disposition: A | Discharge status: Home Health | Payer: Self-pay | Source: Home / Self Care | Attending: Vascular Surgery

## 2017-05-28 DIAGNOSIS — T82898A Other specified complication of vascular prosthetic devices, implants and grafts, initial encounter: Secondary | ICD-10-CM

## 2017-05-28 HISTORY — PX: VEIN HARVEST: SHX6363

## 2017-05-28 HISTORY — PX: FEMORAL-TIBIAL BYPASS GRAFT: SHX938

## 2017-05-28 LAB — BLOOD CULTURE ID PANEL (REFLEXED)
Acinetobacter baumannii: NOT DETECTED
CANDIDA GLABRATA: NOT DETECTED
CANDIDA KRUSEI: NOT DETECTED
CANDIDA TROPICALIS: NOT DETECTED
Candida albicans: NOT DETECTED
Candida parapsilosis: NOT DETECTED
ENTEROBACTER CLOACAE COMPLEX: NOT DETECTED
ESCHERICHIA COLI: NOT DETECTED
Enterobacteriaceae species: NOT DETECTED
Enterococcus species: NOT DETECTED
Haemophilus influenzae: NOT DETECTED
KLEBSIELLA OXYTOCA: NOT DETECTED
KLEBSIELLA PNEUMONIAE: NOT DETECTED
Listeria monocytogenes: NOT DETECTED
Methicillin resistance: NOT DETECTED
Neisseria meningitidis: NOT DETECTED
PROTEUS SPECIES: NOT DETECTED
Pseudomonas aeruginosa: NOT DETECTED
STAPHYLOCOCCUS SPECIES: DETECTED — AB
Serratia marcescens: NOT DETECTED
Staphylococcus aureus (BCID): DETECTED — AB
Streptococcus agalactiae: NOT DETECTED
Streptococcus pneumoniae: NOT DETECTED
Streptococcus pyogenes: NOT DETECTED
Streptococcus species: DETECTED — AB

## 2017-05-28 LAB — CBC
HCT: 36 % — ABNORMAL LOW (ref 39.0–52.0)
Hemoglobin: 11.3 g/dL — ABNORMAL LOW (ref 13.0–17.0)
MCH: 27.1 pg (ref 26.0–34.0)
MCHC: 31.4 g/dL (ref 30.0–36.0)
MCV: 86.3 fL (ref 78.0–100.0)
PLATELETS: 193 10*3/uL (ref 150–400)
RBC: 4.17 MIL/uL — AB (ref 4.22–5.81)
RDW: 14.8 % (ref 11.5–15.5)
WBC: 5.7 10*3/uL (ref 4.0–10.5)

## 2017-05-28 LAB — BASIC METABOLIC PANEL
ANION GAP: 9 (ref 5–15)
BUN: 11 mg/dL (ref 6–20)
CALCIUM: 8.3 mg/dL — AB (ref 8.9–10.3)
CO2: 25 mmol/L (ref 22–32)
Chloride: 103 mmol/L (ref 101–111)
Creatinine, Ser: 0.86 mg/dL (ref 0.61–1.24)
GFR calc Af Amer: >60 mL/min (ref >60)
GLUCOSE: 132 mg/dL — AB (ref 65–99)
POTASSIUM: 4.1 mmol/L (ref 3.5–5.1)
SODIUM: 137 mmol/L (ref 135–145)

## 2017-05-28 LAB — CULTURE, BLOOD (ROUTINE X 2)

## 2017-05-28 LAB — GLUCOSE, CAPILLARY: Glucose-Capillary: 139 mg/dL — ABNORMAL HIGH (ref 65–99)

## 2017-05-28 LAB — PREPARE RBC (CROSSMATCH)

## 2017-05-28 LAB — HEPATITIS C VRS RNA DETECT BY PCR-QUAL: Hepatitis C Vrs RNA by PCR-Qual: NEGATIVE

## 2017-05-28 LAB — PROTIME-INR
INR: 1.13
Prothrombin Time: 14.4 seconds (ref 11.4–15.2)

## 2017-05-28 SURGERY — CREATION, BYPASS, ARTERIAL, FEMORAL TO TIBIAL, USING GRAFT
Anesthesia: General | Site: Leg Upper | Laterality: Right

## 2017-05-28 MED ORDER — SODIUM CHLORIDE 0.9 % IV SOLN: 500.0000 mL | Freq: Once | INTRAVENOUS | Status: DC | PRN

## 2017-05-28 MED ORDER — METOPROLOL TARTRATE 5 MG/5ML IV SOLN: 2.0000 mg | INTRAVENOUS | Status: DC | PRN

## 2017-05-28 MED ORDER — DEXAMETHASONE SODIUM PHOSPHATE 10 MG/ML IJ SOLN
INTRAMUSCULAR | Status: DC | PRN
  Administered 2017-05-28: 5 mg via INTRAVENOUS

## 2017-05-28 MED ORDER — ROCURONIUM BROMIDE 10 MG/ML (PF) SYRINGE
PREFILLED_SYRINGE | INTRAVENOUS | Status: DC | PRN
Start: 2017-05-28 — End: 2017-05-28
  Administered 2017-05-28: 30 mg via INTRAVENOUS
  Administered 2017-05-28: 20 mg via INTRAVENOUS

## 2017-05-28 MED ORDER — LACTATED RINGERS IV SOLN
INTRAVENOUS | Status: DC | PRN
  Administered 2017-05-28 (×2): via INTRAVENOUS

## 2017-05-28 MED ORDER — PROTAMINE SULFATE 10 MG/ML IV SOLN
INTRAVENOUS | Status: AC
  Filled 2017-05-28: qty 5

## 2017-05-28 MED ORDER — FENTANYL CITRATE (PF) 250 MCG/5ML IJ SOLN
INTRAMUSCULAR | Status: DC | PRN
  Administered 2017-05-28: 25 ug via INTRAVENOUS
  Administered 2017-05-28: 100 ug via INTRAVENOUS
  Administered 2017-05-28 (×3): 25 ug via INTRAVENOUS
  Administered 2017-05-28 (×2): 50 ug via INTRAVENOUS
  Administered 2017-05-28 (×2): 25 ug via INTRAVENOUS

## 2017-05-28 MED ORDER — GUAIFENESIN-DM 100-10 MG/5ML PO SYRP: 15.0000 mL | ORAL_SOLUTION | ORAL | Status: DC | PRN

## 2017-05-28 MED ORDER — PHENYLEPHRINE HCL 10 MG/ML IJ SOLN
INTRAMUSCULAR | Status: DC | PRN
  Administered 2017-05-28 (×2): 40 ug via INTRAVENOUS
  Administered 2017-05-28: 120 ug via INTRAVENOUS
  Administered 2017-05-28 (×5): 80 ug via INTRAVENOUS

## 2017-05-28 MED ORDER — EPHEDRINE SULFATE 50 MG/ML IJ SOLN
INTRAMUSCULAR | Status: AC
  Filled 2017-05-28: qty 1

## 2017-05-28 MED ORDER — HYDRALAZINE HCL 20 MG/ML IJ SOLN: 5.0000 mg | INTRAMUSCULAR | Status: DC | PRN

## 2017-05-28 MED ORDER — ONDANSETRON HCL 4 MG/2ML IJ SOLN
INTRAMUSCULAR | Status: DC | PRN
  Administered 2017-05-28: 4 mg via INTRAVENOUS

## 2017-05-28 MED ORDER — MIDAZOLAM HCL 2 MG/2ML IJ SOLN
INTRAMUSCULAR | Status: AC
  Filled 2017-05-28: qty 2

## 2017-05-28 MED ORDER — OXYCODONE HCL 5 MG/5ML PO SOLN: 5.0000 mg | Freq: Once | ORAL | Status: AC | PRN

## 2017-05-28 MED ORDER — FENTANYL CITRATE (PF) 100 MCG/2ML IJ SOLN
25.0000 ug | INTRAMUSCULAR | Status: DC | PRN
  Administered 2017-05-28 (×2): 50 ug via INTRAVENOUS

## 2017-05-28 MED ORDER — SODIUM CHLORIDE 0.9 % IV SOLN: INTRAVENOUS | Status: DC

## 2017-05-28 MED ORDER — MIDAZOLAM HCL 5 MG/5ML IJ SOLN
INTRAMUSCULAR | Status: DC | PRN
Start: 2017-05-28 — End: 2017-05-28
  Administered 2017-05-28: 2 mg via INTRAVENOUS

## 2017-05-28 MED ORDER — FENTANYL CITRATE (PF) 100 MCG/2ML IJ SOLN
INTRAMUSCULAR | Status: AC
  Filled 2017-05-28: qty 2

## 2017-05-28 MED ORDER — SODIUM CHLORIDE 0.9 % IV SOLN
INTRAVENOUS | Status: AC
  Filled 2017-05-28: qty 1.2

## 2017-05-28 MED ORDER — EPHEDRINE SULFATE 50 MG/ML IJ SOLN
INTRAMUSCULAR | Status: DC | PRN
  Administered 2017-05-28: 10 mg via INTRAVENOUS

## 2017-05-28 MED ORDER — ALBUMIN HUMAN 5 % IV SOLN
INTRAVENOUS | Status: DC | PRN
  Administered 2017-05-28 (×2): via INTRAVENOUS

## 2017-05-28 MED ORDER — DEXMEDETOMIDINE HCL 200 MCG/2ML IV SOLN
INTRAVENOUS | Status: DC | PRN
  Administered 2017-05-28: 8 ug via INTRAVENOUS
  Administered 2017-05-28: 24 ug via INTRAVENOUS

## 2017-05-28 MED ORDER — MAGNESIUM SULFATE 2 GM/50ML IV SOLN: 2.0000 g | Freq: Every day | INTRAVENOUS | Status: DC | PRN

## 2017-05-28 MED ORDER — SUCCINYLCHOLINE CHLORIDE 200 MG/10ML IV SOSY
PREFILLED_SYRINGE | INTRAVENOUS | Status: DC | PRN
  Administered 2017-05-28: 80 mg via INTRAVENOUS

## 2017-05-28 MED ORDER — FENTANYL CITRATE (PF) 250 MCG/5ML IJ SOLN
INTRAMUSCULAR | Status: AC
  Filled 2017-05-28: qty 5

## 2017-05-28 MED ORDER — LABETALOL HCL 5 MG/ML IV SOLN: 10.0000 mg | INTRAVENOUS | Status: DC | PRN

## 2017-05-28 MED ORDER — BISACODYL 10 MG RE SUPP: 10.0000 mg | Freq: Every day | RECTAL | Status: DC | PRN

## 2017-05-28 MED ORDER — OXYCODONE-ACETAMINOPHEN 7.5-325 MG PO TABS
1.0000 | ORAL_TABLET | Freq: Four times a day (QID) | ORAL | Status: DC | PRN
  Administered 2017-05-28 – 2017-05-29 (×2): 2 via ORAL
  Administered 2017-05-29 (×2): 1 via ORAL
  Administered 2017-05-29 – 2017-06-04 (×23): 2 via ORAL
  Filled 2017-05-28 (×8): qty 2
  Filled 2017-05-28: qty 1
  Filled 2017-05-28 (×6): qty 2
  Filled 2017-05-28: qty 1
  Filled 2017-05-28 (×12): qty 2

## 2017-05-28 MED ORDER — HEPARIN SODIUM (PORCINE) 1000 UNIT/ML IJ SOLN
INTRAMUSCULAR | Status: AC
  Filled 2017-05-28: qty 1

## 2017-05-28 MED ORDER — OXYCODONE HCL 5 MG PO TABS
ORAL_TABLET | ORAL | Status: AC
  Filled 2017-05-28: qty 1

## 2017-05-28 MED ORDER — ONDANSETRON HCL 4 MG/2ML IJ SOLN
INTRAMUSCULAR | Status: AC
  Filled 2017-05-28: qty 2

## 2017-05-28 MED ORDER — POLYETHYLENE GLYCOL 3350 17 G PO PACK: 17.0000 g | PACK | Freq: Every day | ORAL | Status: DC | PRN

## 2017-05-28 MED ORDER — HEPARIN SODIUM (PORCINE) 5000 UNIT/ML IJ SOLN
5000.0000 [IU] | Freq: Three times a day (TID) | INTRAMUSCULAR | Status: DC
Start: 2017-05-29 — End: 2017-06-04
  Administered 2017-05-29 – 2017-06-04 (×18): 5000 [IU] via SUBCUTANEOUS
  Filled 2017-05-28 (×17): qty 1

## 2017-05-28 MED ORDER — LIDOCAINE 2% (20 MG/ML) 5 ML SYRINGE
INTRAMUSCULAR | Status: DC | PRN
  Administered 2017-05-28: 60 mg via INTRAVENOUS

## 2017-05-28 MED ORDER — DOCUSATE SODIUM 100 MG PO CAPS
100.0000 mg | ORAL_CAPSULE | Freq: Every day | ORAL | Status: DC
  Administered 2017-05-29 – 2017-06-03 (×6): 100 mg via ORAL
  Filled 2017-05-28 (×6): qty 1

## 2017-05-28 MED ORDER — ONDANSETRON HCL 4 MG/2ML IJ SOLN: 4.0000 mg | Freq: Four times a day (QID) | INTRAMUSCULAR | Status: DC | PRN

## 2017-05-28 MED ORDER — PROPOFOL 10 MG/ML IV BOLUS
INTRAVENOUS | Status: AC
  Filled 2017-05-28: qty 20

## 2017-05-28 MED ORDER — SUGAMMADEX SODIUM 200 MG/2ML IV SOLN
INTRAVENOUS | Status: AC
  Filled 2017-05-28: qty 2

## 2017-05-28 MED ORDER — POTASSIUM CHLORIDE CRYS ER 20 MEQ PO TBCR: 20.0000 meq | EXTENDED_RELEASE_TABLET | Freq: Every day | ORAL | Status: DC | PRN

## 2017-05-28 MED ORDER — HEPARIN SODIUM (PORCINE) 1000 UNIT/ML IJ SOLN
INTRAMUSCULAR | Status: DC | PRN
  Administered 2017-05-28: 4000 [IU] via INTRAVENOUS
  Administered 2017-05-28: 7000 [IU] via INTRAVENOUS

## 2017-05-28 MED ORDER — SODIUM CHLORIDE 0.9 % IV SOLN
INTRAVENOUS | Status: DC | PRN
  Administered 2017-05-28: 14:00:00

## 2017-05-28 MED ORDER — PHENOL 1.4 % MT LIQD: 1.0000 | OROMUCOSAL | Status: DC | PRN

## 2017-05-28 MED ORDER — FENTANYL CITRATE (PF) 100 MCG/2ML IJ SOLN
50.0000 ug | INTRAMUSCULAR | Status: AC | PRN
  Administered 2017-05-28 (×2): 50 ug via INTRAVENOUS

## 2017-05-28 MED ORDER — OXYCODONE HCL 5 MG PO TABS
5.0000 mg | ORAL_TABLET | Freq: Once | ORAL | Status: AC | PRN
  Administered 2017-05-28: 5 mg via ORAL

## 2017-05-28 MED ORDER — PANTOPRAZOLE SODIUM 40 MG PO TBEC
40.0000 mg | DELAYED_RELEASE_TABLET | Freq: Every day | ORAL | Status: DC
  Administered 2017-05-29 – 2017-06-03 (×6): 40 mg via ORAL
  Filled 2017-05-28 (×6): qty 1

## 2017-05-28 MED ORDER — FENTANYL CITRATE (PF) 100 MCG/2ML IJ SOLN
INTRAMUSCULAR | Status: AC
  Administered 2017-05-28: 50 ug via INTRAVENOUS
  Filled 2017-05-28: qty 2

## 2017-05-28 MED ORDER — PROPOFOL 10 MG/ML IV BOLUS
INTRAVENOUS | Status: DC | PRN
  Administered 2017-05-28: 20 mg via INTRAVENOUS
  Administered 2017-05-28: 120 mg via INTRAVENOUS
  Administered 2017-05-28: 50 mg via INTRAVENOUS

## 2017-05-28 MED ORDER — PROTAMINE SULFATE 10 MG/ML IV SOLN
INTRAVENOUS | Status: DC | PRN
  Administered 2017-05-28 (×2): 20 mg via INTRAVENOUS
  Administered 2017-05-28: 10 mg via INTRAVENOUS

## 2017-05-28 MED ORDER — PHENYLEPHRINE 40 MCG/ML (10ML) SYRINGE FOR IV PUSH (FOR BLOOD PRESSURE SUPPORT)
PREFILLED_SYRINGE | INTRAVENOUS | Status: AC
  Filled 2017-05-28: qty 10

## 2017-05-28 MED ORDER — SUGAMMADEX SODIUM 200 MG/2ML IV SOLN
INTRAVENOUS | Status: DC | PRN
  Administered 2017-05-28: 200 mg via INTRAVENOUS

## 2017-05-28 MED ORDER — 0.9 % SODIUM CHLORIDE (POUR BTL) OPTIME
TOPICAL | Status: DC | PRN
  Administered 2017-05-28: 2000 mL

## 2017-05-28 MED ORDER — DEXTROSE 5 % IV SOLN
INTRAVENOUS | Status: DC | PRN
  Administered 2017-05-28: 65 ug/min via INTRAVENOUS

## 2017-05-28 SURGICAL SUPPLY — 52 items
ADH SKN CLS APL DERMABOND .7 (GAUZE/BANDAGES/DRESSINGS) ×8
BANDAGE ESMARK 6X9 LF (GAUZE/BANDAGES/DRESSINGS) IMPLANT
BNDG CMPR 9X6 STRL LF SNTH (GAUZE/BANDAGES/DRESSINGS) ×2
BNDG ESMARK 6X9 LF (GAUZE/BANDAGES/DRESSINGS) ×4
CANISTER SUCT 3000ML PPV (MISCELLANEOUS) ×4 IMPLANT
CANISTER WOUND CARE 500ML ATS (WOUND CARE) ×2 IMPLANT
CANNULA VESSEL 3MM 2 BLNT TIP (CANNULA) ×8 IMPLANT
CLIP LIGATING EXTRA SM BLUE (MISCELLANEOUS) ×4 IMPLANT
CUFF TOURNIQUET SINGLE 34IN LL (TOURNIQUET CUFF) ×2 IMPLANT
DERMABOND ADVANCED (GAUZE/BANDAGES/DRESSINGS) ×8
DERMABOND ADVANCED .7 DNX12 (GAUZE/BANDAGES/DRESSINGS) IMPLANT
DRAPE ORTHO SPLIT 77X108 STRL (DRAPES) ×4
DRAPE SURG ORHT 6 SPLT 77X108 (DRAPES) IMPLANT
DRSG VAC ATS MED SENSATRAC (GAUZE/BANDAGES/DRESSINGS) ×2 IMPLANT
ELECT REM PT RETURN 9FT ADLT (ELECTROSURGICAL) ×4
ELECTRODE REM PT RTRN 9FT ADLT (ELECTROSURGICAL) ×2 IMPLANT
GLOVE BIO SURGEON STRL SZ 6.5 (GLOVE) ×4 IMPLANT
GLOVE BIO SURGEON STRL SZ7 (GLOVE) ×12 IMPLANT
GLOVE BIO SURGEON STRL SZ7.5 (GLOVE) ×2 IMPLANT
GLOVE BIO SURGEONS STRL SZ 6.5 (GLOVE) ×4
GLOVE BIOGEL PI IND STRL 6.5 (GLOVE) IMPLANT
GLOVE BIOGEL PI IND STRL 7.0 (GLOVE) IMPLANT
GLOVE BIOGEL PI IND STRL 8 (GLOVE) IMPLANT
GLOVE BIOGEL PI INDICATOR 6.5 (GLOVE) ×8
GLOVE BIOGEL PI INDICATOR 7.0 (GLOVE) ×10
GLOVE BIOGEL PI INDICATOR 8 (GLOVE) ×2
GLOVE SS BIOGEL STRL SZ 7.5 (GLOVE) ×2 IMPLANT
GLOVE SUPERSENSE BIOGEL SZ 7.5 (GLOVE) ×2
GOWN STRL REUS W/ TWL LRG LVL3 (GOWN DISPOSABLE) ×6 IMPLANT
GOWN STRL REUS W/TWL LRG LVL3 (GOWN DISPOSABLE) ×32
KIT BASIN OR (CUSTOM PROCEDURE TRAY) ×4 IMPLANT
KIT TURNOVER KIT B (KITS) ×4 IMPLANT
NS IRRIG 1000ML POUR BTL (IV SOLUTION) ×8 IMPLANT
PACK PERIPHERAL VASCULAR (CUSTOM PROCEDURE TRAY) ×4 IMPLANT
PAD ARMBOARD 7.5X6 YLW CONV (MISCELLANEOUS) ×8 IMPLANT
SPONGE LAP 18X18 5 PK (GAUZE/BANDAGES/DRESSINGS) ×6 IMPLANT
STOCKINETTE 6  STRL (DRAPES) ×2
STOCKINETTE 6 STRL (DRAPES) IMPLANT
SUT PROLENE 5 0 C 1 24 (SUTURE) ×4 IMPLANT
SUT PROLENE 6 0 CC (SUTURE) ×10 IMPLANT
SUT SILK 2 0 SH (SUTURE) ×4 IMPLANT
SUT SILK 3 0 (SUTURE) ×4
SUT SILK 3-0 18XBRD TIE 12 (SUTURE) IMPLANT
SUT VIC AB 2-0 CTX 36 (SUTURE) ×6 IMPLANT
SUT VIC AB 3-0 SH 27 (SUTURE) ×20
SUT VIC AB 3-0 SH 27X BRD (SUTURE) ×4 IMPLANT
SUT VIC AB 4-0 PS2 18 (SUTURE) ×2 IMPLANT
TOWEL GREEN STERILE (TOWEL DISPOSABLE) ×4 IMPLANT
TOWEL GREEN STERILE FF (TOWEL DISPOSABLE) ×4 IMPLANT
TRAY FOLEY MTR SLVR 16FR STAT (SET/KITS/TRAYS/PACK) ×4 IMPLANT
UNDERPAD 30X30 (UNDERPADS AND DIAPERS) ×4 IMPLANT
WATER STERILE IRR 1000ML POUR (IV SOLUTION) ×4 IMPLANT

## 2017-05-28 NOTE — Anesthesia Preprocedure Evaluation (Addendum)
Anesthesia Evaluation  Patient identified by MRN, date of birth, ID band Patient awake    Reviewed: Allergy & Precautions, NPO status , Patient's Chart, lab work & pertinent test results  History of Anesthesia Complications Negative for: history of anesthetic complications  Airway Mallampati: II  TM Distance: >3 FB Neck ROM: Full    Dental  (+) Edentulous Upper, Edentulous Lower   Pulmonary asthma , sleep apnea , COPD,  COPD inhaler, Current Smoker,    breath sounds clear to auscultation       Cardiovascular hypertension, Pt. on medications + Peripheral Vascular Disease and +CHF   Rhythm:Regular     Neuro/Psych  Headaches, Seizures -,  PSYCHIATRIC DISORDERS Anxiety Depression  Neuromuscular disease CVA    GI/Hepatic hiatal hernia, GERD  ,(+)     substance abuse  , Hepatitis -, C  Endo/Other  diabetes  Renal/GU negative Renal ROS     Musculoskeletal   Abdominal   Peds  Hematology   Anesthesia Other Findings   Reproductive/Obstetrics                                                             Anesthesia Evaluation  Patient identified by MRN, date of birth, ID band Patient awake    Reviewed: Allergy & Precautions, NPO status , Patient's Chart, lab work & pertinent test results  Airway Mallampati: II   Neck ROM: Full    Dental  (+) Dental Advisory Given, Poor Dentition, Edentulous Upper   Pulmonary asthma , sleep apnea , COPD, Current Smoker, PE Hx TB s/p treatment 1990   breath sounds clear to auscultation       Cardiovascular hypertension, + Peripheral Vascular Disease and +CHF  + Valvular Problems/Murmurs  Rhythm:Regular Rate:Normal  '16 TTE - EF 75-64%, grade 1 diastolic dysfx, moderate TR  '16 Carotid US - Occluded left carotid, 1-39% right ICAS   Neuro/Psych Seizures -,  Anxiety Depression CVA    GI/Hepatic hiatal hernia, GERD  ,(+)     substance abuse  IV drug use, Hepatitis -, C  Endo/Other  diabetes, Type 2  Renal/GU negative Renal ROS  negative genitourinary   Musculoskeletal  (+) Arthritis , Rheumatoid disorders,    Abdominal   Peds  Hematology negative hematology ROS (+)   Anesthesia Other Findings   Reproductive/Obstetrics                             Anesthesia Physical  Anesthesia Plan  ASA: III and emergent  Anesthesia Plan: General   Post-op Pain Management:    Induction: Intravenous, Rapid sequence and Cricoid pressure planned  PONV Risk Score and Plan: 2 and Ondansetron and Dexamethasone  Airway Management Planned: Oral ETT  Additional Equipment:   Intra-op Plan:   Post-operative Plan: Extubation in OR  Informed Consent: I have reviewed the patients History and Physical, chart, labs and discussed the procedure including the risks, benefits and alternatives for the proposed anesthesia with the patient or authorized representative who has indicated his/her understanding and acceptance.   Dental advisory given  Plan Discussed with: CRNA, Anesthesiologist and Surgeon  Anesthesia Plan Comments:  Anesthesia Evaluation  Patient identified by MRN, date of birth, ID band Patient awake    Reviewed: Allergy & Precautions, NPO status , Patient's Chart, lab work & pertinent test results  Airway Mallampati: II  TM Distance: >3 FB Neck ROM: Full    Dental  (+) Edentulous Upper, Poor Dentition   Pulmonary Current Smoker,  breath sounds clear to auscultation        Cardiovascular hypertension, Rhythm:Regular Rate:Normal     Neuro/Psych    GI/Hepatic   Endo/Other  diabetes  Renal/GU      Musculoskeletal   Abdominal   Peds  Hematology   Anesthesia Other Findings   Reproductive/Obstetrics                            Anesthesia Physical Anesthesia Plan  ASA: III  Anesthesia  Plan: General   Post-op Pain Management:    Induction: Intravenous  Airway Management Planned: Oral ETT  Additional Equipment:   Intra-op Plan:   Post-operative Plan: Extubation in OR  Informed Consent: I have reviewed the patients History and Physical, chart, labs and discussed the procedure including the risks, benefits and alternatives for the proposed anesthesia with the patient or authorized representative who has indicated his/her understanding and acceptance.     Plan Discussed with: CRNA and Anesthesiologist  Anesthesia Plan Comments:         Anesthesia Quick Evaluation                                   Anesthesia Evaluation  Patient identified by MRN, date of birth, ID band Patient awake    Reviewed: Allergy & Precautions, H&P , NPO status , Patient's Chart, lab work & pertinent test results  Airway Mallampati: II  TM Distance: >3 FB Neck ROM: Full    Dental   Pulmonary shortness of breath, sleep apnea , COPDCurrent Smoker,          Cardiovascular hypertension, Pt. on medications + Peripheral Vascular Disease     Neuro/Psych    GI/Hepatic GERD-  Medicated and Controlled,(+) Hepatitis -, C  Endo/Other  diabetes, Type 2, Insulin Dependent  Renal/GU      Musculoskeletal   Abdominal   Peds  Hematology   Anesthesia Other Findings   Reproductive/Obstetrics                             Anesthesia Physical Anesthesia Plan  ASA: IV  Anesthesia Plan: General ETT   Post-op Pain Management:    Induction:   Airway Management Planned:   Additional Equipment:   Intra-op Plan:   Post-operative Plan:   Informed Consent:   Plan Discussed with:   Anesthesia Plan Comments:         Anesthesia Quick Evaluation  Anesthesia Quick Evaluation  Anesthesia Physical Anesthesia Plan  ASA: III and emergent  Anesthesia Plan: General   Post-op Pain Management:    Induction: Intravenous  PONV  Risk Score and Plan: 1 and Ondansetron and Dexamethasone  Airway Management Planned: Oral ETT  Additional Equipment: None  Intra-op Plan:   Post-operative Plan: Extubation in OR  Informed Consent: I have reviewed the patients History and Physical, chart, labs and discussed the procedure including the risks, benefits and alternatives for the proposed anesthesia with the patient or authorized representative who has indicated his/her understanding and  acceptance.   Dental advisory given  Plan Discussed with: Surgeon and CRNA  Anesthesia Plan Comments:         Anesthesia Quick Evaluation

## 2017-05-28 NOTE — Transfer of Care (Signed)
Immediate Anesthesia Transfer of Care Note  Patient: [name]  Procedure(s) Performed: REVISION OF PORTION OF RIGHT UPPER LEG  BYPASS GRAFT USING LEFT ARM BASILIC VEIN (Right Leg Upper) LEFT BASILIC  VEIN HARVEST (Left Arm Upper)  Patient Location: PACU  Anesthesia Type:General  Level of Consciousness: awake, alert  and oriented  Airway & Oxygen Therapy: Patient Spontanous Breathing and Patient connected to nasal cannula oxygen  Post-op Assessment: Report given to RN, Post -op Vital signs reviewed and stable and Patient moving all extremities  Post vital signs: Reviewed and stable  Last Vitals:  Vitals Value Taken Time  BP 105/62 05/28/2017  4:55 PM  Temp    Pulse 94 05/28/2017  5:00 PM  Resp 32 05/28/2017  5:00 PM  SpO2 99 % 05/28/2017  5:00 PM  Vitals shown include unvalidated device data.  Last Pain:  Vitals:   05/28/17 1256  TempSrc:   PainSc: 10-Worst pain ever      Patients Stated Pain Goal: 0 (63/87/56 [id number])  Complications: No apparent anesthesia complications

## 2017-05-28 NOTE — Anesthesia Procedure Notes (Signed)
Procedure Name: Intubation Date/Time: 05/28/2017 1:23 PM Performed by: Freddie Breech, CRNA Pre-anesthesia Checklist: Patient identified, Emergency Drugs available, Suction available and Patient being monitored Patient Re-evaluated:Patient Re-evaluated prior to induction Oxygen Delivery Method: Circle System Utilized Preoxygenation: Pre-oxygenation with 100% oxygen Induction Type: IV induction Ventilation: Mask ventilation without difficulty Laryngoscope Size: Mac and 4 Grade View: Grade II Tube type: Oral Tube size: 7.5 mm Number of attempts: 1 Airway Equipment and Method: Stylet Placement Confirmation: ETT inserted through vocal cords under direct vision,  positive ETCO2 and breath sounds checked- equal and bilateral Secured at: 23 cm Tube secured with: Tape Dental Injury: Teeth and Oropharynx as per pre-operative assessment

## 2017-05-28 NOTE — Interval H&P Note (Signed)
History and Physical Interval Note:  05/28/2017 12:56 PM  [name]  has presented today for surgery, with the diagnosis of CELLULITIS RIGHT THIGH PERIPHERAL ARTERY DISEASE  The various methods of treatment have been discussed with the patient and family. After consideration of risks, benefits and other options for treatment, the patient has consented to  Procedure(s): BYPASS GRAFT FEMORAL-ANTERIOR TIBIAL ARTERY REVISION RIGHT LEG (Right) as a surgical intervention .  The patient's history has been reviewed, patient examined, no change in status, stable for surgery.  I have reviewed the patient's chart and labs.  Questions were answered to the patient's satisfaction.     Curt Jews

## 2017-05-28 NOTE — Op Note (Signed)
OPERATIVE REPORT  DATE OF SURGERY: 05/28/2017  PATIENT: [name], 57 y.o. 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  PHYSICIAN ASSISTANT: Dr. Gae Gallop and Liana Crocker, PA-C  ANESTHESIA: General  EBL: 50 ml  Total I/O In: 1900 [I.V.:1400; IV Piggyback:500] Out: 900 [Urine:850; Blood:50]  BLOOD ADMINISTERED: None  DRAINS: None  SPECIMEN: None  COUNTS CORRECT:  YES  PLAN OF CARE: PACU  PATIENT DISPOSITION:  PACU - hemodynamically stable  PROCEDURE DETAILS: Patient had a prior history of left femoral to anterior tibial bypass placed years ago.  He had presented approximately 1 month ago with infection in the mid thigh.  He is an IV drug abuser and has been using his thin anterior tibial bypass for heroin injection.  He had a debridement of this by Dr. Oneida Alar.  He did have some rebleeding 24 hours later and was taken back to the operating room and reclosed.  He had been seen in our office and this appeared to be healing adequately.  He presented again to the hospital with increasing pain and erythema and was placed on antibiotics.  Over the course of the last 24 hours did have marked progression of this with increased pain and now fluctuance.  He is taken to the operating room at this time for repair.  Earlier today actually had some bleeding from this and this was controlled with pressure.  A pneumatic tourniquet was placed high on his thigh above the area of concern.  The patient had prior harvest of his saphenous vein on both sides and also small saphenous vein on the left side.  I imaged his arms with  SonoSite and he did have a basilic vein on the left arm that did appear to be adequate for bypass.  The left arm and right leg were prepped.  The patient was given 7000 units of intravenous heparin and the pneumatic tourniquet was inflated prior to prepping and removal of the pressure dressing.  Incision was made over the femoral to anterior tibial graft above and below the area of infection and the vein was well incorporated at this area and was controlled with vascular clamps.  The pneumatic tourniquet was deflated.  Next the separate incision was made at the level of the antecubital space on the left arm and incision was made from this area to the axilla and the basilic vein was harvested.  The vein was very good caliber throughout its course tributary branches were ligated with 3-0 and 4-0 silk ties and divided.  The vein was ligated in the axilla with a 2-0 silk suture and divided.  The had vein had multiple branches at the antecubital space and these were ligated and divided.  A tunnel was created anterior to the area of infection on the thigh from the incision that had isolated to the vein above and below the area of infection.  The vein was brought through this tunnel.  The old vein graft was transected proximally and distally was spatulated.  The interposition of basilic vein was sewn into and proximally and distally with 6-0 Prolene sutures.  The plans were removed and excellent flow was  noted through the bypass.  The wounds were closed in the thigh with 3-0 Vicryl suture in the subcutaneous tissue and subcuticular tissue.  The left basilic vein harvest was closed with several layers of 3-0 Vicryl sutures.  Finally the area of infected false aneurysm was explored.  There was extensive subcutaneous fat and skin tissue loss.  All nonvital tissue was excised.  The vein had completely been disrupted in this area and this was removed as well.  After all the nonvital tissue was debrided back sponge dressing was  brought onto the field and was cut to the appropriate dimension and was back dressing was placed.  The patient was transferred to the recovery room in stable condition   [name], M.D., [facility] 05/28/2017 5:44 PM

## 2017-05-29 DIAGNOSIS — I739 Peripheral vascular disease, unspecified: Secondary | ICD-10-CM

## 2017-05-29 DIAGNOSIS — Z978 Presence of other specified devices: Secondary | ICD-10-CM

## 2017-05-29 LAB — HEPATIC FUNCTION PANEL
ALT: 12 U/L — ABNORMAL LOW (ref 17–63)
AST: 16 U/L (ref 15–41)
Albumin: 2.3 g/dL — ABNORMAL LOW (ref 3.5–5.0)
Alkaline Phosphatase: 42 U/L (ref 38–126)
BILIRUBIN DIRECT: 0.4 mg/dL (ref 0.1–0.5)
BILIRUBIN INDIRECT: 0.5 mg/dL (ref 0.3–0.9)
Total Bilirubin: 0.9 mg/dL (ref 0.3–1.2)
Total Protein: 5.7 g/dL — ABNORMAL LOW (ref 6.5–8.1)

## 2017-05-29 LAB — CBC
HCT: 24.4 % — ABNORMAL LOW (ref 39.0–52.0)
HEMOGLOBIN: 7.6 g/dL — AB (ref 13.0–17.0)
MCH: 27 pg (ref 26.0–34.0)
MCHC: 31.1 g/dL (ref 30.0–36.0)
MCV: 86.5 fL (ref 78.0–100.0)
PLATELETS: 203 10*3/uL (ref 150–400)
RBC: 2.82 MIL/uL — AB (ref 4.22–5.81)
RDW: 15 % (ref 11.5–15.5)
WBC: 5.6 10*3/uL (ref 4.0–10.5)

## 2017-05-29 LAB — BASIC METABOLIC PANEL
ANION GAP: 6 (ref 5–15)
BUN: 11 mg/dL (ref 6–20)
CO2: 27 mmol/L (ref 22–32)
Calcium: 7.8 mg/dL — ABNORMAL LOW (ref 8.9–10.3)
Chloride: 105 mmol/L (ref 101–111)
Creatinine, Ser: 0.86 mg/dL (ref 0.61–1.24)
Glucose, Bld: 178 mg/dL — ABNORMAL HIGH (ref 65–99)
POTASSIUM: 4.4 mmol/L (ref 3.5–5.1)
SODIUM: 138 mmol/L (ref 135–145)

## 2017-05-29 LAB — PREPARE RBC (CROSSMATCH)

## 2017-05-29 MED ORDER — SODIUM CHLORIDE 0.9 % IV SOLN
Freq: Once | INTRAVENOUS | Status: AC
  Administered 2017-05-29: 12:00:00 via INTRAVENOUS

## 2017-05-29 MED ORDER — FUROSEMIDE 10 MG/ML IJ SOLN
20.0000 mg | Freq: Once | INTRAMUSCULAR | Status: AC
  Administered 2017-05-29: 20 mg via INTRAVENOUS
  Filled 2017-05-29: qty 2

## 2017-05-29 NOTE — Progress Notes (Addendum)
  Progress Note    05/29/2017 8:13 AM 1 Day Post-Op  Subjective:  Says he is having pain  Afebrile HR 80's-100's NSR 664'Q-034'V systolic 42% RA  Vitals:   05/28/17 2014 05/29/17 0342  BP: 127/68 (!) 120/59  Pulse: 80 95  Resp: (!) 22 19  Temp: 97.6 F (36.4 C) 98.6 F (37 C)  SpO2: 100% 99%    Physical Exam: Cardiac:  regular Lungs:  Non labored Incisions:  Both leg incisions are clean and dry and wound vac with good seal.  Left arm incision is clean and dry with ecchymosis but with no hematoma-arm is soft. Extremities:  +palpable right leg graft pulse; right foot is warm and motor is in tact   CBC    Component Value Date/Time   WBC 5.6 05/29/2017 0312   RBC 2.82 (L) 05/29/2017 0312   HGB 7.6 (L) 05/29/2017 0312   HCT 24.4 (L) 05/29/2017 0312   PLT 203 05/29/2017 0312   MCV 86.5 05/29/2017 0312   MCH 27.0 05/29/2017 0312   MCHC 31.1 05/29/2017 0312   RDW 15.0 05/29/2017 0312   LYMPHSABS 1.0 05/25/2017 0832   MONOABS 0.8 05/25/2017 0832   EOSABS 0.1 05/25/2017 0832   BASOSABS 0.0 05/25/2017 0832    BMET    Component Value Date/Time   NA 138 05/29/2017 0312   K 4.4 05/29/2017 0312   CL 105 05/29/2017 0312   CO2 27 05/29/2017 0312   GLUCOSE 178 (H) 05/29/2017 0312   BUN 11 05/29/2017 0312   CREATININE 0.86 05/29/2017 0312   CREATININE 0.85 05/10/2014 1114   CALCIUM 7.8 (L) 05/29/2017 0312   CALCIUM 9.1 10/28/2009 2118   GFRNONAA >60 05/29/2017 0312   GFRNONAA >89 02/01/2014 1518   GFRAA >60 05/29/2017 0312   GFRAA >89 02/01/2014 1518    INR    Component Value Date/Time   INR 1.13 05/28/2017 0359     Intake/Output Summary (Last 24 hours) at 05/29/2017 0813 Last data filed at 05/29/2017 0630 Gross per 24 hour  Intake 2620 ml  Output 2925 ml  Net -305 ml     Assessment:  57 y.o. male is s/p:  Revision with rerouting of basilic vein from the left upper arm around the infected segment of the femoral to anterior tibial bypass, debridement of  right thigh and VAC placement  1 Day Post-Op  Plan: -pt's with palpable graft pulse right leg -wound vac with good seal and incisions on leg are clean and dry; incision left arm looks good with ecchymosis. -acute surgical blood loss anemia-hgb 7.6 (down from 11.3) tolerating.  Will d/w Dr. Donnetta Hutching  -oob today -continue IV abx -DVT prophylaxis:  SQ heparin to start this afternoon -check labs in am   [name], PA-C [facility] [phone] 05/29/2017 8:13 AM  I have examined the patient, reviewed and agree with above.  Curt Jews, MD 05/29/2017 11:50 AM

## 2017-05-29 NOTE — Anesthesia Postprocedure Evaluation (Signed)
Anesthesia Post Note  Patient: [name]  Procedure(s) Performed: REVISION OF PORTION OF RIGHT UPPER LEG  BYPASS GRAFT USING LEFT ARM BASILIC VEIN (Right Leg Upper) LEFT BASILIC  VEIN HARVEST (Left Arm Upper)     Patient location during evaluation: PACU Anesthesia Type: General Level of consciousness: awake and patient cooperative Pain management: pain level controlled Vital Signs Assessment: post-procedure vital signs reviewed and stable Respiratory status: spontaneous breathing, nonlabored ventilation, respiratory function stable and patient connected to nasal cannula oxygen Cardiovascular status: blood pressure returned to baseline and stable Postop Assessment: no apparent nausea or vomiting Anesthetic complications: no    Last Vitals:  Vitals:   05/28/17 2014 05/29/17 0342  BP: 127/68 (!) 120/59  Pulse: 80 95  Resp: (!) 22 19  Temp: 36.4 C 37 C  SpO2: 100% 99%    Last Pain:  Vitals:   05/29/17 1017  TempSrc:   PainSc: 8                  Brycen Bean

## 2017-05-29 NOTE — Plan of Care (Signed)
  Problem: Education: Goal: Knowledge of General Education information will improve 05/29/2017 2221 by Blair Promise, RN Outcome: Progressing 05/29/2017 2221 by Blair Promise, RN Outcome: Progressing   Problem: Clinical Measurements: Goal: Ability to maintain clinical measurements within normal limits will improve 05/29/2017 2221 by Blair Promise, RN Outcome: Progressing 05/29/2017 2221 by Blair Promise, RN Outcome: Progressing Goal: Will remain free from infection 05/29/2017 2221 by Blair Promise, RN Outcome: Progressing 05/29/2017 2221 by Blair Promise, RN Outcome: Progressing Goal: Diagnostic test results will improve 05/29/2017 2221 by Blair Promise, RN Outcome: Progressing 05/29/2017 2221 by Blair Promise, RN Outcome: Progressing Goal: Respiratory complications will improve 05/29/2017 2221 by Blair Promise, RN Outcome: Progressing 05/29/2017 2221 by Blair Promise, RN Outcome: Progressing Goal: Cardiovascular complication will be avoided 05/29/2017 2221 by Blair Promise, RN Outcome: Progressing 05/29/2017 2221 by Blair Promise, RN Outcome: Progressing

## 2017-05-29 NOTE — Evaluation (Signed)
Physical Therapy Evaluation Patient Details Name: [name] MRN: [id number] DOB: May 31, 1960 Today's Date: 05/29/2017   History of Present Illness  Pt is a 57 year old male with history of chronic heroin use presented to the ED with worsening anxiety as well as chronic pain.  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 He underwent surgical revision of graft 05/28/17. PMH consists of CVA, chronic pain, HTN, COPD, PE, PVD, and DM.     Clinical Impression  Pt admitted with above diagnosis. Pt currently with functional limitations due to the deficits listed below (see PT Problem List). On eval pt required supervision bed mobility and min guard assist sit to stand with RW. Mobility limited by pain RLE. Pt reports he sat in bedside recliner this AM with nsg assist for transfer. Currently, pt Hgb 7.6. He is scheduled to receive 1 unit PRBC this PM. Pt will benefit from skilled PT to increase their independence and safety with mobility to allow discharge to the venue listed below.       Follow Up Recommendations No PT follow up;Supervision - Intermittent    Equipment Recommendations  Rolling walker with 5" wheels    Recommendations for Other Services       Precautions / Restrictions Precautions Precautions: Fall;Other (comment) Precaution Comments: wound vac RLE      Mobility  Bed Mobility Overal bed mobility: Needs Assistance Bed Mobility: Supine to Sit;Sit to Supine     Supine to sit: Supervision;HOB elevated Sit to supine: Supervision;HOB elevated   General bed mobility comments: supervision for safety, increased time and effort, +rail  Transfers Overall transfer level: Needs assistance Equipment used: Rolling walker (2 wheeled) Transfers: Sit to/from Stand Sit to Stand: Min guard         General transfer comment: verbal cues for hand placement  Ambulation/Gait             General  Gait Details: unable due to pain  Stairs            Wheelchair Mobility    Modified Rankin (Stroke Patients Only)       Balance Overall balance assessment: Needs assistance Sitting-balance support: No upper extremity supported;Feet supported Sitting balance-Leahy Scale: Good     Standing balance support: During functional activity Standing balance-Leahy Scale: Fair Standing balance comment: static standing                             Pertinent Vitals/Pain Pain Assessment: 0-10 Pain Score: 9  Pain Location: RLE Pain Descriptors / Indicators: Guarding;Grimacing;Constant Pain Intervention(s): Limited activity within patient's tolerance    Home Living Family/patient expects to be discharged to:: Private residence Living Arrangements: Spouse/significant other Available Help at Discharge: Family Type of Home: Apartment Home Access: Stairs to enter   Technical brewer of Steps: flight Home Layout: One level Home Equipment: Grab bars - tub/shower      Prior Function Level of Independence: Needs assistance   Gait / Transfers Assistance Needed: no AD for ambulation  ADL's / Homemaking Assistance Needed: unclear of assist level needed for ADLs  Comments: Pt is a poor historian. Unsure of validity of above information.     Hand Dominance        Extremity/Trunk Assessment   Upper Extremity Assessment Upper Extremity Assessment: Overall WFL for tasks assessed    Lower Extremity Assessment Lower Extremity Assessment: RLE deficits/detail RLE:  Unable to fully assess due to pain    Cervical / Trunk Assessment Cervical / Trunk Assessment: Kyphotic  Communication   Communication: No difficulties  Cognition Arousal/Alertness: Awake/alert Behavior During Therapy: Anxious Overall Cognitive Status: History of cognitive impairments - at baseline                                 General Comments: perseverating on pain      General  Comments      Exercises     Assessment/Plan    PT Assessment Patient needs continued PT services  PT Problem List Decreased strength;Decreased mobility;Decreased safety awareness;Decreased activity tolerance;Pain;Decreased balance;Decreased knowledge of use of DME       PT Treatment Interventions DME instruction;Therapeutic activities;Gait training;Therapeutic exercise;Patient/family education;Stair training;Balance training;Functional mobility training    PT Goals (Current goals can be found in the Care Plan section)  Acute Rehab PT Goals Patient Stated Goal: decrease pain PT Goal Formulation: With patient Time For Goal Achievement: 06/12/17 Potential to Achieve Goals: Fair    Frequency Min 3X/week   Barriers to discharge        Co-evaluation               AM-PAC PT "6 Clicks" Daily Activity  Outcome Measure Difficulty turning over in bed (including adjusting bedclothes, sheets and blankets)?: A Little Difficulty moving from lying on back to sitting on the side of the bed? : A Little Difficulty sitting down on and standing up from a chair with arms (e.g., wheelchair, bedside commode, etc,.)?: A Lot Help needed moving to and from a bed to chair (including a wheelchair)?: A Little Help needed walking in hospital room?: A Little Help needed climbing 3-5 steps with a railing? : A Lot 6 Click Score: 16    End of Session Equipment Utilized During Treatment: Gait belt Activity Tolerance: Patient limited by pain Patient left: in bed;with call bell/phone within reach Nurse Communication: Mobility status PT Visit Diagnosis: Pain;Difficulty in walking, not elsewhere classified (R26.2) Pain - Right/Left: Right Pain - part of body: Leg    Time: 9629-5284 PT Time Calculation (min) (ACUTE ONLY): 14 min   Charges:   PT Evaluation $PT Eval Moderate Complexity: 1 Mod     PT G Codes:        [name], PT  Office # [phone] Pager [phone]   [name] 05/29/2017, 1:55 PM

## 2017-05-29 NOTE — Progress Notes (Signed)
INFECTIOUS DISEASE PROGRESS NOTE  ID: [name] is a 57 y.o. male with  Active Problems:   Cellulitis  Subjective: No complaints Denies smoking in his room (this adm, security in room).   Abtx:  Anti-infectives (From admission, onward)   Start     Dose/Rate Route Frequency Ordered Stop   05/27/17 1000  rifampin (RIFADIN) capsule 300 mg     300 mg Oral Every 12 hours 05/27/17 0938     05/26/17 2200  ceFAZolin (ANCEF) IVPB 2g/100 mL premix     2 g 200 mL/hr over 30 Minutes Intravenous Every 8 hours 05/26/17 1712     05/26/17 0000  vancomycin (VANCOCIN) IVPB 1000 mg/200 mL premix  Status:  Discontinued     1,000 mg 200 mL/hr over 60 Minutes Intravenous To Surgery 05/25/17 1615 05/25/17 1620   05/25/17 2200  piperacillin-tazobactam (ZOSYN) IVPB 3.375 g  Status:  Discontinued     3.375 g 12.5 mL/hr over 240 Minutes Intravenous Every 8 hours 05/25/17 1615 05/26/17 1712   05/25/17 1615  ceFAZolin (ANCEF) IVPB 1 g/50 mL premix  Status:  Discontinued     1 g 100 mL/hr over 30 Minutes Intravenous Every 8 hours 05/25/17 1605 05/25/17 1621      Medications:  Scheduled: . clopidogrel  75 mg Oral Daily  . docusate sodium  100 mg Oral Daily  . furosemide  20 mg Intravenous Once  . heparin  5,000 Units Subcutaneous Q8H  . hydrOXYzine  25 mg Oral BID  . nicotine  21 mg Transdermal Daily  . pantoprazole  40 mg Oral Daily  . pregabalin  75 mg Oral BID  . rifampin  300 mg Oral Q12H    Objective: Vital signs in last 24 hours: Temp:  [97.6 F (36.4 C)-98.6 F (37 C)] 98.6 F (37 C) (05/18 0342) Pulse Rate:  [80-99] 95 (05/18 0342) Resp:  [18-26] 19 (05/18 0342) BP: (105-130)/(59-80) 120/59 (05/18 0342) SpO2:  [95 %-100 %] 99 % (05/18 0342)   General appearance: alert, cooperative and no distress Resp: clear to auscultation bilaterally Cardio: regular rate and rhythm GI: normal findings: bowel sounds normal and soft, non-tender Incision/Wound: vac in place. Pulse  palpable distal to vac, lower thigh.   Lab Results Recent Labs    05/28/17 0359 05/29/17 0312  WBC 5.7 5.6  HGB 11.3* 7.6*  HCT 36.0* 24.4*  NA 137 138  K 4.1 4.4  CL 103 105  CO2 25 27  BUN 11 11  CREATININE 0.86 0.86   Liver Panel Recent Labs    05/26/17 1855  ALT PLASMA   Sedimentation Rate No results for input(s): ESRSEDRATE in the last 72 hours. C-Reactive Protein No results for input(s): CRP in the last 72 hours.  Microbiology: Recent Results (from the past 240 hour(s))  Culture, blood (routine x 2)     Status: Abnormal   Collection Time: 05/25/17  8:22 PM  Result Value Ref Range Status   Specimen Description BLOOD LEFT ARM  Final   Special Requests   Final    BOTTLES DRAWN AEROBIC ONLY Blood Culture results may not be optimal due to an inadequate volume of blood received in culture bottles   Culture  Setup Time   Final    GRAM POSITIVE COCCI AEROBIC BOTTLE ONLY CRITICAL RESULT CALLED TO, READ BACK BY AND VERIFIED WITH: PHARMD J [facility] 05/26/17 AT 1501 BY CM    Culture (A)  Final    STAPHYLOCOCCUS AUREUS SUSCEPTIBILITIES PERFORMED ON  PREVIOUS CULTURE WITHIN THE LAST 5 DAYS. VIRIDANS STREPTOCOCCUS THE SIGNIFICANCE OF ISOLATING THIS ORGANISM FROM A SINGLE SET OF BLOOD CULTURES WHEN MULTIPLE SETS ARE DRAWN IS UNCERTAIN. PLEASE NOTIFY THE MICROBIOLOGY DEPARTMENT WITHIN ONE WEEK IF SPECIATION AND SENSITIVITIES ARE REQUIRED. Performed at [facility], [place] [address] [place], [place] [address]    Report Status 05/28/2017 FINAL  Final  Blood Culture ID Panel (Reflexed)     Status: Abnormal   Collection Time: 05/25/17  8:22 PM  Result Value Ref Range Status   Enterococcus species NOT DETECTED NOT DETECTED Final   Listeria monocytogenes NOT DETECTED NOT DETECTED Final   Staphylococcus species DETECTED (A) NOT DETECTED Final    Comment: CRITICAL RESULT CALLED TO, READ BACK BY AND VERIFIED WITH: PHARMD J MIRREN 05/26/17 1501 BY CM    Staphylococcus aureus  DETECTED (A) NOT DETECTED Final    Comment: Methicillin (oxacillin) susceptible Staphylococcus aureus (MSSA). Preferred therapy is anti staphylococcal beta lactam antibiotic (Cefazolin or Nafcillin), unless clinically contraindicated. CRITICAL RESULT CALLED TO, READ BACK BY AND VERIFIED WITH: PHARMD J MIRREN 05/26/17 1501 BY CM    Methicillin resistance NOT DETECTED NOT DETECTED Final   Streptococcus species DETECTED (A) NOT DETECTED Final    Comment: Not Enterococcus species, Streptococcus agalactiae, Streptococcus pyogenes, or Streptococcus pneumoniae. CRITICAL RESULT CALLED TO, READ BACK BY AND VERIFIED WITH: PHARMD J MIRREN 05/26/17 1501 BY CM    Streptococcus agalactiae NOT DETECTED NOT DETECTED Final   Streptococcus pneumoniae NOT DETECTED NOT DETECTED Final   Streptococcus pyogenes NOT DETECTED NOT DETECTED Final   Acinetobacter baumannii NOT DETECTED NOT DETECTED Final   Enterobacteriaceae species NOT DETECTED NOT DETECTED Final   Enterobacter cloacae complex NOT DETECTED NOT DETECTED Final   Escherichia coli NOT DETECTED NOT DETECTED Final   Klebsiella oxytoca NOT DETECTED NOT DETECTED Final   Klebsiella pneumoniae NOT DETECTED NOT DETECTED Final   Proteus species NOT DETECTED NOT DETECTED Final   Serratia marcescens NOT DETECTED NOT DETECTED Final   Haemophilus influenzae NOT DETECTED NOT DETECTED Final   Neisseria meningitidis NOT DETECTED NOT DETECTED Final   Pseudomonas aeruginosa NOT DETECTED NOT DETECTED Final   Candida albicans NOT DETECTED NOT DETECTED Final   Candida glabrata NOT DETECTED NOT DETECTED Final   Candida krusei NOT DETECTED NOT DETECTED Final   Candida parapsilosis NOT DETECTED NOT DETECTED Final   Candida tropicalis NOT DETECTED NOT DETECTED Final    Comment: Performed at [facility], [place] [address] [place], [place] [address]  Culture, blood (routine x 2)     Status: Abnormal   Collection Time: 05/25/17  8:25 PM  Result Value Ref Range Status    Specimen Description BLOOD LEFT HAND  Final   Special Requests   Final    BOTTLES DRAWN AEROBIC ONLY Blood Culture results may not be optimal due to an inadequate volume of blood received in culture bottles   Culture  Setup Time   Final    GRAM POSITIVE COCCI AEROBIC BOTTLE ONLY CRITICAL VALUE NOTED.  VALUE IS CONSISTENT WITH PREVIOUSLY REPORTED AND CALLED VALUE. Performed at [facility], [place] [address] [place], [place] [address]    Culture STAPHYLOCOCCUS AUREUS (A)  Final   Report Status 05/28/2017 FINAL  Final   Organism ID, Bacteria STAPHYLOCOCCUS AUREUS  Final      Susceptibility   Staphylococcus aureus - MIC*    CIPROFLOXACIN <=0.5 SENSITIVE Sensitive     ERYTHROMYCIN <=0.25 SENSITIVE Sensitive  GENTAMICIN <=0.5 SENSITIVE Sensitive     OXACILLIN 0.5 SENSITIVE Sensitive     TETRACYCLINE <=1 SENSITIVE Sensitive     VANCOMYCIN <=0.5 SENSITIVE Sensitive     TRIMETH/SULFA <=10 SENSITIVE Sensitive     CLINDAMYCIN <=0.25 SENSITIVE Sensitive     RIFAMPIN <=0.5 SENSITIVE Sensitive     Inducible Clindamycin NEGATIVE Sensitive     * STAPHYLOCOCCUS AUREUS  Culture, blood (Routine X 2) w Reflex to ID Panel     Status: None (Preliminary result)   Collection Time: 05/26/17  6:55 PM  Result Value Ref Range Status   Specimen Description BLOOD BLOOD LEFT FOREARM  Final   Special Requests   Final    BOTTLES DRAWN AEROBIC AND ANAEROBIC Blood Culture adequate volume   Culture   Final    NO GROWTH 3 DAYS Performed at [facility], [address], [place], [place] [address]    Report Status PENDING  Incomplete    Studies/Results: No results found.   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  Afeb, wbc normal Last LFTs on 5-14 normal. Will reorder since pt on rifampin.  Will order TTE Available as needed on 5-19         Bobby Rumpf MD,  FACP Infectious Diseases (pager) [phone] [email or web address] 05/29/2017, 12:10 PM  LOS: 4 days

## 2017-05-30 ENCOUNTER — Other Ambulatory Visit (HOSPITAL_COMMUNITY): Payer: Self-pay

## 2017-05-30 ENCOUNTER — Encounter (HOSPITAL_COMMUNITY): Payer: Self-pay | Admitting: Vascular Surgery

## 2017-05-30 LAB — CBC
HCT: 29.1 % — ABNORMAL LOW (ref 39.0–52.0)
Hemoglobin: 9.3 g/dL — ABNORMAL LOW (ref 13.0–17.0)
MCH: 27.4 pg (ref 26.0–34.0)
MCHC: 32 g/dL (ref 30.0–36.0)
MCV: 85.8 fL (ref 78.0–100.0)
PLATELETS: 254 10*3/uL (ref 150–400)
RBC: 3.39 MIL/uL — AB (ref 4.22–5.81)
RDW: 14.8 % (ref 11.5–15.5)
WBC: 6 10*3/uL (ref 4.0–10.5)

## 2017-05-30 NOTE — Plan of Care (Signed)
  Problem: Education: Goal: Knowledge of General Education information will improve Outcome: Progressing   Problem: Activity: Goal: Risk for activity intolerance will decrease Outcome: Progressing   Problem: Elimination: Goal: Will not experience complications related to bowel motility Outcome: Progressing   Problem: Skin Integrity: Goal: Risk for impaired skin integrity will decrease Outcome: Progressing

## 2017-05-30 NOTE — Evaluation (Signed)
Occupational Therapy Evaluation Patient Details Name: [name] MRN: [id number] DOB: 12/12/1960 Today's Date: 05/30/2017    History of Present Illness Pt is a 57 year old male with history of chronic heroin use presented to the ED with worsening anxiety as well as chronic pain.  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 He underwent surgical revision of graft 05/28/17. PMH consists of CVA, chronic pain, HTN, COPD, PE, PVD, and DM.    Clinical Impression   Pt admitted with the above diagnoses and presents with below problem list. Pt will benefit from continued acute OT to address the below listed deficits and maximize independence with basic ADLs prior to d/c home. Unclear what PLOF was with ADLs, he reports he did not use AD but is questionable historian. Pt received standing in room having recently disconnected his wound vac line to walk to the recliner. Pt stating that he is eager to walk in the halls. Pt completed full hallway ambulation using rw. Pt cued to take 1 standing rest break at midpoint to ensure that he was not overexerting himself. Discussed importance of rest breaks. Pt tolerated mobility well; premedicated. Pt min guard with LB ADLs and functional mobility/transfers.     Follow Up Recommendations  No OT follow up;Supervision - Intermittent    Equipment Recommendations  None recommended by OT    Recommendations for Other Services       Precautions / Restrictions Precautions Precautions: Fall;Other (comment) Precaution Comments: wound vac RLE      Mobility Bed Mobility               General bed mobility comments: pt up walking in room on OT arrival  Transfers Overall transfer level: Needs assistance Equipment used: Rolling walker (2 wheeled) Transfers: Sit to/from Stand Sit to Stand: Min guard         General transfer comment: verbal cues for hand placement     Balance Overall balance assessment: Needs assistance Sitting-balance support: No upper extremity supported;Feet supported Sitting balance-Leahy Scale: Good     Standing balance support: During functional activity Standing balance-Leahy Scale: Fair                             ADL either performed or assessed with clinical judgement   ADL Overall ADL's : Needs assistance/impaired Eating/Feeding: Set up;Sitting   Grooming: Set up;Sitting   Upper Body Bathing: Set up;Sitting   Lower Body Bathing: Min guard;Sit to/from stand   Upper Body Dressing : Set up;Sitting   Lower Body Dressing: Min guard;Sit to/from stand   Toilet Transfer: Min guard;Ambulation   Toileting- Clothing Manipulation and Hygiene: Min guard;Sit to/from stand   Tub/ Shower Transfer: Tub transfer;Minimal assistance;Ambulation;Rolling walker Tub/Shower Transfer Details (indicate cue type and reason): discussed sponge bathing at sink initially Functional mobility during ADLs: Rolling walker;Min guard General ADL Comments: Pt completed community distance functional mobility (entire length of unit hallway). Cued pt to take a standing rest break at mid point, not sure pt would vebalize/recognize need to rest and would overexert.  Pt tolerated walking well, premedicated.      Vision         Perception     Praxis      Pertinent Vitals/Pain Pain Assessment: Faces Faces Pain Scale: Hurts little more Pain Location: RLE Pain Descriptors / Indicators: Guarding;Grimacing Pain Intervention(s): Monitored during session;Premedicated before session;Repositioned  Hand Dominance Right   Extremity/Trunk Assessment Upper Extremity Assessment Upper Extremity Assessment: Overall WFL for tasks assessed   Lower Extremity Assessment Lower Extremity Assessment: Defer to PT evaluation   Cervical / Trunk Assessment Cervical / Trunk Assessment: Kyphotic   Communication Communication Communication: No  difficulties   Cognition Arousal/Alertness: Awake/alert Behavior During Therapy: Restless;Flat affect;Impulsive Overall Cognitive Status: History of cognitive impairments - at baseline                                 General Comments: Upon OT arrival pt OOB walking towards recliner having unhooked his wound vac line.    General Comments       Exercises     Shoulder Instructions      Home Living Family/patient expects to be discharged to:: Private residence Living Arrangements: Spouse/significant other Available Help at Discharge: Family Type of Home: Other(Comment)("motel") Home Access: Stairs to enter Technical brewer of Steps: flight   Home Layout: One level     Bathroom Shower/Tub: Teacher, early years/pre: Standard     Home Equipment: Grab bars - tub/shower          Prior Functioning/Environment Level of Independence: Needs assistance  Gait / Transfers Assistance Needed: no AD for ambulation ADL's / Homemaking Assistance Needed: unclear of assist level needed for ADLs   Comments: Pt is a poor historian. Unsure of validity of above information.        OT Problem List: Impaired balance (sitting and/or standing);Decreased safety awareness;Decreased knowledge of use of DME or AE;Decreased knowledge of precautions;Pain      OT Treatment/Interventions: Self-care/ADL training;DME and/or AE instruction;Therapeutic activities;Patient/family education;Balance training    OT Goals(Current goals can be found in the care plan section) Acute Rehab OT Goals Patient Stated Goal: decrease pain, walk OT Goal Formulation: With patient Time For Goal Achievement: 06/13/17 Potential to Achieve Goals: Good ADL Goals Pt Will Perform Grooming: with modified independence;standing Pt Will Perform Lower Body Bathing: with modified independence;sit to/from stand Pt Will Perform Lower Body Dressing: with modified independence;sit to/from stand Pt Will  Transfer to Toilet: with modified independence;ambulating Pt Will Perform Toileting - Clothing Manipulation and hygiene: with modified independence;sit to/from stand Pt Will Perform Tub/Shower Transfer: Tub transfer;ambulating;rolling walker  OT Frequency: Min 2X/week   Barriers to D/C:            Co-evaluation              AM-PAC PT "6 Clicks" Daily Activity     Outcome Measure Help from another person eating meals?: None Help from another person taking care of personal grooming?: None Help from another person toileting, which includes using toliet, bedpan, or urinal?: A Little Help from another person bathing (including washing, rinsing, drying)?: A Little Help from another person to put on and taking off regular upper body clothing?: None Help from another person to put on and taking off regular lower body clothing?: A Little 6 Click Score: 21   End of Session Equipment Utilized During Treatment: Rolling walker Nurse Communication: Mobility status;Other (comment)(wound vac line disconnected)  Activity Tolerance: Patient tolerated treatment well Patient left: in chair;with call bell/phone within reach  OT Visit Diagnosis: Unsteadiness on feet (R26.81);Pain                Time: 1517-6160 OT Time Calculation (min): 22 min Charges:  OT General Charges $OT Visit: 1 Visit OT Evaluation $OT Eval Low  Complexity: 1 Low G-Codes:       Hortencia Pilar 05/30/2017, 11:21 AM

## 2017-05-30 NOTE — Progress Notes (Addendum)
  Progress Note    05/30/2017 8:42 AM 2 Days Post-Op  Subjective:  C/o pain in his leg  Afebrile HR 70's-90's NSR 081'K-481'E systolic 563% RA  Vitals:   05/29/17 2354 05/30/17 0420  BP: 120/73   Pulse: 82 88  Resp: (!) 22   Temp: 98.4 F (36.9 C) 98.2 F (36.8 C)  SpO2: 100% 100%    Physical Exam: Cardiac:  regular Lungs:  Non labored Incisions:  Leg incisions are clean and dry.  Wound vac with good seal.  LUA incision is clean and dry with ecchymosis. Extremities:  + palpable right leg graft pulse   CBC    Component Value Date/Time   WBC 6.0 05/30/2017 0346   RBC 3.39 (L) 05/30/2017 0346   HGB 9.3 (L) 05/30/2017 0346   HCT 29.1 (L) 05/30/2017 0346   PLT 254 05/30/2017 0346   MCV 85.8 05/30/2017 0346   MCH 27.4 05/30/2017 0346   MCHC 32.0 05/30/2017 0346   RDW 14.8 05/30/2017 0346   LYMPHSABS 1.0 05/25/2017 0832   MONOABS 0.8 05/25/2017 0832   EOSABS 0.1 05/25/2017 0832   BASOSABS 0.0 05/25/2017 0832    BMET    Component Value Date/Time   NA 138 05/29/2017 0312   K 4.4 05/29/2017 0312   CL 105 05/29/2017 0312   CO2 27 05/29/2017 0312   GLUCOSE 178 (H) 05/29/2017 0312   BUN 11 05/29/2017 0312   CREATININE 0.86 05/29/2017 0312   CREATININE 0.85 05/10/2014 1114   CALCIUM 7.8 (L) 05/29/2017 0312   CALCIUM 9.1 10/28/2009 2118   GFRNONAA >60 05/29/2017 0312   GFRNONAA >89 02/01/2014 1518   GFRAA >60 05/29/2017 0312   GFRAA >89 02/01/2014 1518    INR    Component Value Date/Time   INR 1.13 05/28/2017 0359     Intake/Output Summary (Last 24 hours) at 05/30/2017 0842 Last data filed at 05/30/2017 0327 Gross per 24 hour  Intake 252 ml  Output 685 ml  Net -433 ml     Assessment:  57 y.o. male is s/p:  Revision with rerouting of basilic vein from the left upper arm around the infected segment of the femoral to anterior tibial bypass, debridement of right thigh and VAC placement   2 Days Post-Op  Plan: -pt with palpable graft pulse right  leg -wound vac continues to have a good seal and incisions look good including the left arm incision -DVT prophylaxis:  SQ heparin -PT recommends no PT f/u with intermittent supervision and RW with 5"wheels. -ID following-TTE ordered.  [name], PA-C [facility] [phone] 05/30/2017 8:42 AM   I have examined the patient, reviewed and agree with above.  Curt Jews, MD 05/30/2017 10:29 AM

## 2017-05-31 ENCOUNTER — Inpatient Hospital Stay (HOSPITAL_COMMUNITY): Payer: Medicaid Other

## 2017-05-31 DIAGNOSIS — Z9889 Other specified postprocedural states: Secondary | ICD-10-CM

## 2017-05-31 DIAGNOSIS — I5030 Unspecified diastolic (congestive) heart failure: Secondary | ICD-10-CM

## 2017-05-31 DIAGNOSIS — F1190 Opioid use, unspecified, uncomplicated: Secondary | ICD-10-CM

## 2017-05-31 DIAGNOSIS — L02415 Cutaneous abscess of right lower limb: Secondary | ICD-10-CM

## 2017-05-31 LAB — CULTURE, BLOOD (ROUTINE X 2)
CULTURE: NO GROWTH
Special Requests: ADEQUATE

## 2017-05-31 NOTE — Progress Notes (Signed)
Physical Therapy Discharge Patient Details Name: BRAYAN VOTAW MRN: [id number] DOB: 08/06/1960 Today's Date: 05/31/2017 Time: 2060-1561 PT Time Calculation (min) (ACUTE ONLY): 10 min  Patient discharged from PT services secondary to goals met and no further PT needs identified.  Please see latest therapy progress note for current level of functioning and progress toward goals.    Progress and discharge plan discussed with patient and/or caregiver: Patient/Caregiver agrees with plan  GP     Denice Paradise 05/31/2017, 1:19 PM  [facility] Acute Rehabilitation [phone] (pager)

## 2017-05-31 NOTE — Progress Notes (Signed)
  Echocardiogram 2D Echocardiogram has been performed.  [name] F 05/31/2017, 11:12 AM

## 2017-05-31 NOTE — Progress Notes (Addendum)
Physical Therapy Treatment and D/C Patient Details Name: [name] MRN: [id number] DOB: 07/28/1960 Today's Date: 05/31/2017    History of Present Illness Pt is a 57-year-old male with history of chronic heroin use presented to the ED with worsening anxiety as well as chronic pain.  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 He underwent surgical revision of graft 05/28/17. PMH consists of CVA, chronic pain, HTN, COPD, PE, PVD, and DM.     PT Comments    Pt admitted with above diagnosis. Pt currently without significant functional limitations and can ambulate with  And without RW without losing balance. Pt was  able to ambulate without assist by staff.  Pt overall with good safety and appears to be at baseline regarding mobility.  Pt does not need any further skilled PT at this time.  Sign off due to goals met.   Follow Up Recommendations  No PT follow up;Supervision - Intermittent     Equipment Recommendations  (pt states he has a RW at home)    Recommendations for Other Services       Precautions / Restrictions Precautions Precautions: Fall;Other (comment) Precaution Comments: wound vac RLE disconnected on arrival - pt states waiting for nurse to come and change it.     Mobility  Bed Mobility Overal bed mobility: Needs Assistance Bed Mobility: Supine to Sit;Sit to Supine     Supine to sit: Independent Sit to supine: Independent      Transfers Overall transfer level: Needs assistance Equipment used: Rolling walker (2 wheeled) Transfers: Sit to/from Stand Sit to Stand: Independent            Ambulation/Gait Ambulation/Gait assistance: Modified independent (Device/Increase time);Independent Ambulation Distance (Feet): 450 Feet Assistive device: Rolling walker (2 wheeled);None Gait Pattern/deviations: Step-through pattern;Decreased stride length   Gait velocity interpretation:  1.31 - 2.62 ft/sec, indicative of limited community ambulator General Gait Details: Pt has been walking in hallway all am per pt.  Pt ambulates with RW with Modif I without difficulty.  Also told PT that he would be going back to work on Wed and he would not use RW at that point.  He can walk without device as well and did not lose balance with min challenges to balance.  Encouraged pt to use RW for ultimate safety. Question patient's compliance.    Stairs Stairs: Yes Stairs assistance: Modified independent (Device/Increase time) Stair Management: One rail Left;Step to pattern;Forwards Number of Stairs: 12 General stair comments: Pt did well on the steps and did not need assist or cues   Wheelchair Mobility    Modified Rankin (Stroke Patients Only)       Balance Overall balance assessment: Needs assistance Sitting-balance support: No upper extremity supported;Feet supported Sitting balance-Leahy Scale: Good     Standing balance support: During functional activity Standing balance-Leahy Scale: Fair Standing balance comment: static standing without assist.                             Cognition Arousal/Alertness: Awake/alert Behavior During Therapy: Impulsive;Anxious Overall Cognitive Status: History of cognitive impairments - at baseline                                        Exercises      General Comments          Pertinent Vitals/Pain Pain Assessment: Faces Faces Pain Scale: Hurts little more Pain Location: RLE Pain Descriptors / Indicators: Guarding;Grimacing Pain Intervention(s): Limited activity within patient's tolerance;Monitored during session;Repositioned  VSS  Home Living                      Prior Function            PT Goals (current goals can now be found in the care plan section) Acute Rehab PT Goals PT Goal Formulation: All assessment and education complete, DC therapy Progress towards PT goals: Progressing  toward goals    Frequency    Min 3X/week      PT Plan Current plan remains appropriate    Co-evaluation              AM-PAC PT "6 Clicks" Daily Activity  Outcome Measure  Difficulty turning over in bed (including adjusting bedclothes, sheets and blankets)?: None Difficulty moving from lying on back to sitting on the side of the bed? : None Difficulty sitting down on and standing up from a chair with arms (e.g., wheelchair, bedside commode, etc,.)?: None Help needed moving to and from a bed to chair (including a wheelchair)?: None Help needed walking in hospital room?: None Help needed climbing 3-5 steps with a railing? : None 6 Click Score: 24    End of Session Equipment Utilized During Treatment: Gait belt Activity Tolerance: Patient tolerated treatment well Patient left: with call bell/phone within reach;in chair Nurse Communication: Mobility status PT Visit Diagnosis: Pain;Difficulty in walking, not elsewhere classified (R26.2) Pain - Right/Left: Right Pain - part of body: Leg     Time: 1478-2956 PT Time Calculation (min) (ACUTE ONLY): 10 min  Charges:  $Gait Training: 8-22 mins                    G Codes:       [place] [phone] (pager)    Denice Paradise 05/31/2017, 1:13 PM

## 2017-05-31 NOTE — Progress Notes (Signed)
VASCULAR LAB PRELIMINARY  ARTERIAL  ABI completed:Right ABI indicates mild reduction in arterial blood flow, post op.  Left ABI indicates severe reduction in arterial blood flow.  Bilateral TBIs are abnormal.     RIGHT    LEFT    PRESSURE WAVEFORM  PRESSURE WAVEFORM  BRACHIAL 117 T BRACHIAL restricted   DP 108 M DP 52 M  AT   AT    PT 85 M PT 54 M  PER   PER    GREAT TOE 0 NA GREAT TOE 0.09 NA    RIGHT LEFT  ABI 0.92 0.46     Soraiya Ahner, RVT 05/31/2017, 4:27 PM

## 2017-05-31 NOTE — Progress Notes (Addendum)
[facility] [place]  Subjective  - Doing OK this am.  No new complaints.  Incisional pain left arm and right leg.   Objective (!) 149/70 78 98.7 F (37.1 C) (Oral) (!) 26 100%  Intake/Output Summary (Last 24 hours) at 05/31/2017 0710 Last data filed at 05/31/2017 0428 Gross per 24 hour  Intake 222 ml  Output 1200 ml  Net -978 ml    Palpable left graft pulse.  Wound vac in place functioning well.  Left arm incision healing well, palpable radial pulse. Heart RRR Lungs non labored breathing   Assessment/Planning: POD # 2 PROCEDURE: Revision with rerouting of basilic vein from the left upper arm around the infected segment of the femoral to anterior tibial bypass, debridement of right thigh and VAC placement   Patent revised right by pass with palpable pulse Plan for wound vac change today at bed side  continue Plavix daily, IV Rifampin and Ancef ID following WBC 6.0, afebrile    Roxy Horseman 05/31/2017 7:10 AM --  Laboratory Lab Results: Recent Labs    05/29/17 0312 05/30/17 0346  WBC 5.6 6.0  HGB 7.6* 9.3*  HCT 24.4* 29.1*  PLT 203 254   BMET Recent Labs    05/29/17 0312  NA 138  K 4.4  CL 105  CO2 27  GLUCOSE 178*  BUN 11  CREATININE 0.86  CALCIUM 7.8*    COAG Lab Results  Component Value Date   INR 1.13 05/28/2017   INR 1.32 05/25/2017   INR 1.13 03/17/2017   No results found for: PTT  I have examined the patient, reviewed and agree with above.  Changed his wound VAC.  Good granulation tissue with no further necrosis.  Bypass patent  Curt Jews, MD 05/31/2017 3:47 PM

## 2017-05-31 NOTE — Progress Notes (Signed)
    [facility] for Infectious Disease    Date of Admission:  05/25/2017      ID: [name] is a 57 y.o. male with recent heroin use, with right thigh MSSA abscess dating back early march with now presumed graft infection POD#3 s/p Revision with rerouting of basilic vein from the left upper arm around the infected segment of the femoral to anterior tibial bypass, debridement of right thigh and VAC placement     Active Problems:   Cellulitis    Subjective: Afebrile but has ongoing right thigh pain, wound vac in place.   Medications:  . clopidogrel  75 mg Oral Daily  . docusate sodium  100 mg Oral Daily  . heparin  5,000 Units Subcutaneous Q8H  . hydrOXYzine  25 mg Oral BID  . nicotine  21 mg Transdermal Daily  . pantoprazole  40 mg Oral Daily  . pregabalin  75 mg Oral BID  . rifampin  300 mg Oral Q12H    Objective: Vital signs in last 24 hours: Temp:  [97.6 F (36.4 C)-98.7 F (37.1 C)] 97.6 F (36.4 C) (05/20 1158) Pulse Rate:  [78-91] 85 (05/20 1158) Resp:  [18-26] 26 (05/20 0428) BP: (106-149)/(66-74) 106/66 (05/20 1158) Physical Exam  Constitutional: He is oriented to person, place, and time. He appears older than stated age, disheveled and poorly-nourished. No distress.  HENT: poor dentition Mouth/Throat: Oropharynx is clear and moist. No oropharyngeal exudate.  Cardiovascular: Normal rate, regular rhythm and normal heart sounds. Exam reveals no gallop and no friction rub.  No murmur heard.  Pulmonary/Chest: Effort normal and breath sounds normal. No respiratory distress. He has no wheezes.  Ext = right thigh tender to touch but less warmth and erythema. Wound vac in place lateral distal thigh Neurological: He is alert and oriented to person, place, and time.  Psychiatric: He has a normal mood and affect. His behavior is normal.    Lab Results Recent Labs    05/29/17 0312 05/30/17 0346  WBC 5.6 6.0  HGB 7.6* 9.3*  HCT 24.4* 29.1*  NA 138  --   K  4.4  --   CL 105  --   CO2 27  --   BUN 11  --   CREATININE 0.86  --    Liver Panel Recent Labs    05/29/17 0312  PROT 5.7*  ALBUMIN 2.3*  AST 16  ALT 12*  ALKPHOS 42  BILITOT 0.9  BILIDIR 0.4  IBILI 0.5    Microbiology: 5/15 blood cx ngtd 5/14 blood cx MSSA Studies/Results: No results found.   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 Will likely need long term iv abtx, 6 wk of IV therapy then transition to orals for 6 months.  TTE pending as part of endocarditis work up.   which will be complicated for dispo given that he should not go home with picc line.   [facility] for Infectious Diseases Cell: [phone] Pager: [phone]  05/31/2017, 2:51 PM

## 2017-06-01 LAB — BPAM RBC
BLOOD PRODUCT EXPIRATION DATE: 201906042359
Blood Product Expiration Date: 201906042359
ISSUE DATE / TIME: 201905171322
ISSUE DATE / TIME: 201905181619
UNIT TYPE AND RH: 6200
Unit Type and Rh: 6200

## 2017-06-01 LAB — TYPE AND SCREEN
ABO/RH(D): A POS
ANTIBODY SCREEN: NEGATIVE
UNIT DIVISION: 0
Unit division: 0

## 2017-06-01 LAB — POCT I-STAT 4, (NA,K, GLUC, HGB,HCT)
Glucose, Bld: 109 mg/dL — ABNORMAL HIGH (ref 65–99)
HCT: 32 % — ABNORMAL LOW (ref 39.0–52.0)
HEMOGLOBIN: 10.9 g/dL — AB (ref 13.0–17.0)
Potassium: 5.6 mmol/L — ABNORMAL HIGH (ref 3.5–5.1)
Sodium: 137 mmol/L (ref 135–145)

## 2017-06-01 NOTE — Progress Notes (Signed)
Subjective: Interval History: none.. Less soreness in his right side than yesterday.  Has been up walking this morning.  VAC working appropriately  Objective: Vital signs in last 24 hours: Temp:  [97.6 F (36.4 C)] 97.6 F (36.4 C) (05/20 1158) Pulse Rate:  [85] 85 (05/20 1158) BP: (106)/(66) 106/66 (05/20 1158)  Intake/Output from previous day: 05/20 0701 - 05/21 0700 In: 340 [P.O.:240] Out: 1100 [Urine:1100] Intake/Output this shift: Total I/O In: 340 [P.O.:240; Other:100] Out: 450 [Urine:450]  Minimal erythema around VAC dressing.  Right femoral anterior tibial bypass patent  Lab Results: Recent Labs    05/30/17 0346  WBC 6.0  HGB 9.3*  HCT 29.1*  PLT 254   BMET No results for input(s): NA, K, CL, CO2, GLUCOSE, BUN, CREATININE, CALCIUM in the last 72 hours.  Studies/Results: No results found. Anti-infectives: Anti-infectives (From admission, onward)   Start     Dose/Rate Route Frequency Ordered Stop   05/27/17 1000  rifampin (RIFADIN) capsule 300 mg     300 mg Oral Every 12 hours 05/27/17 0938     05/26/17 2200  ceFAZolin (ANCEF) IVPB 2g/100 mL premix     2 g 200 mL/hr over 30 Minutes Intravenous Every 8 hours 05/26/17 1712     05/26/17 0000  vancomycin (VANCOCIN) IVPB 1000 mg/200 mL premix  Status:  Discontinued     1,000 mg 200 mL/hr over 60 Minutes Intravenous To Surgery 05/25/17 1615 05/25/17 1620   05/25/17 2200  piperacillin-tazobactam (ZOSYN) IVPB 3.375 g  Status:  Discontinued     3.375 g 12.5 mL/hr over 240 Minutes Intravenous Every 8 hours 05/25/17 1615 05/26/17 1712   05/25/17 1615  ceFAZolin (ANCEF) IVPB 1 g/50 mL premix  Status:  Discontinued     1 g 100 mL/hr over 30 Minutes Intravenous Every 8 hours 05/25/17 1605 05/25/17 1621      Assessment/Plan: s/p Procedure(s): REVISION OF PORTION OF RIGHT UPPER LEG  BYPASS GRAFT USING LEFT ARM BASILIC VEIN (Right) LEFT BASILIC  VEIN HARVEST (Left) Continue to mobilize and IV antibiotics.  Patient  refusing consideration of nursing facility for IV antibiotics.  Will ask ID to potentially just long-term oral regimen.  His   LOS: 7 days   Keylie Beavers 06/01/2017, 6:15 AM

## 2017-06-01 NOTE — Care Management Note (Signed)
Case Management Note Marvetta Gibbons RN, BSN Unit 4E-Case Manager [phone]  Patient Details  Name: [name] MRN: [id number] Date of Birth: 1960-03-24  Subjective/Objective:  Pt admitted with cellulitis - infected bypass graft. Plan for I&D on 5/17                 Action/Plan: PTA pt lived at home- hx of IVDU, CM to follow for transition of care needs post I&D  Expected Discharge Date:                  Expected Discharge Plan:  [place]  In-House Referral:     Discharge planning Services  CM Consult  Post Acute Care Choice:    Choice offered to:     DME Arranged:    DME Agency:     HH Arranged:    HH Agency:     Status of Service:  In process, will continue to follow  If discussed at Long Length of Stay Meetings, dates discussed:  5/21  Discharge Disposition:   Additional Comments:  06/01/17- 1100- Kaelum Kissick RN, CM- per ID pt will need 6 wks of IV abx- however pt not agreeable to SNF placement for IV abx (hx IVDU) and therefor plan will be to d/c home with po abx coarse- will try to keep pt in house for IV abx as long as possible (2wks per ID)- pt will need HHRN and wound VAC for discharge- orders have been placed and [facility] wound VAC form has been signed- spoke with pt at bedside- pt has had wound vac in past and per pt [facility] services with [facility]- pt agreeable to using [facility] again for [facility] needs and home wound VAC- call made to [facility] with [facility] for [facility] and home wound VAC referral/DME needs- will start approval process for home wound VAC- will potential d/c of 5/24.   Dawayne Patricia, RN 06/01/2017, 11:16 AM

## 2017-06-01 NOTE — Progress Notes (Signed)
[facility] [place]  Subjective  - Doing well walking halls independently.   Objective 106/66 85 97.6 F (36.4 C) (Oral) (!) 26 100%  Intake/Output Summary (Last 24 hours) at 06/01/2017 0709 Last data filed at 06/01/2017 0100 Gross per 24 hour  Intake 340 ml  Output 1100 ml  Net -760 ml    Left LE wound vac in place 100 cc drainage output Palpable graft pulse, palpable DP pulse Heart RRR Lungs non labored     RIGHT    LEFT    PRESSURE WAVEFORM  PRESSURE WAVEFORM  BRACHIAL 117 T BRACHIAL restricted   DP 108 M DP 29 M  AT   AT    PT 30 M PT 66 M  PER   PER    GREAT TOE 0 NA GREAT TOE 0.09 NA    RIGHT LEFT  ABI 0.92 0.46      Assessment/Planning: POD # 3 PROCEDURE:Revision with rerouting of basilic vein from the left upper arm around the infected segment of the femoral to anterior tibial bypass, debridement of right thigh and VAC placement   Patent by pass graft with distal palpable pulse right LE Wound vac care with HH.  Plan wound vac change tomorrow.   Continue Plavix daily Would appreciate ID in put for oral antibiotics instead of PICC line for IV antibiotics secondary to drug abuse issues. Plan discharge Friday.    [name] 06/01/2017 7:09 AM --  Laboratory Lab Results: Recent Labs    05/30/17 0346  WBC 6.0  HGB 9.3*  HCT 29.1*  PLT 254   BMET No results for input(s): NA, K, CL, CO2, GLUCOSE, BUN, CREATININE, CALCIUM in the last 72 hours.  COAG Lab Results  Component Value Date   INR 1.13 05/28/2017   INR 1.32 05/25/2017   INR 1.13 03/17/2017   No results found for: PTT

## 2017-06-01 NOTE — Progress Notes (Addendum)
    [facility]    Date of Admission:  05/25/2017   Total days of antibiotics 8        Day 8 cefazolin        Day 6  rif           ID: [name] is a 57 y.o. male with MSSA cellulitis, deep abscess vascular graft infection Active Problems:   Cellulitis    Subjective: Afebrile, "going home Friday no matter what". Less pain today. Underwent TTE yesterday did not see vegetations has AV restriction noted also in 2016 TTE.-unchanged. Afebrile since 5/17  Medications:  . clopidogrel  75 mg Oral Daily  . docusate sodium  100 mg Oral Daily  . heparin  5,000 Units Subcutaneous Q8H  . hydrOXYzine  25 mg Oral BID  . nicotine  21 mg Transdermal Daily  . pantoprazole  40 mg Oral Daily  . pregabalin  75 mg Oral BID  . rifampin  300 mg Oral Q12H    Objective: Vital signs in last 24 hours: Temp:  [97.7 F (36.5 C)-97.9 F (36.6 C)] 97.9 F (36.6 C) (05/21 2012) Pulse Rate:  [82-84] 84 (05/21 2012) Resp:  [14-16] 16 (05/21 2012) BP: (135-139)/(75-79) 135/75 (05/21 2012) SpO2:  [100 %] 100 % (05/21 2012) Physical Exam  Constitutional: He is oriented to person, place, and time. He appears well-developed and well-nourished. No distress.  HENT:  Mouth/Throat: Oropharynx is clear and moist. No oropharyngeal exudate.  Cardiovascular: Normal rate, regular rhythm and normal heart sounds. Exam reveals no gallop and no friction rub.  No murmur heard.  Pulmonary/Chest: Effort normal and breath sounds normal. No respiratory distress. He has no wheezes.  Abdominal: Soft. Bowel sounds are normal. He exhibits no distension. There is no tenderness.  Ext: right thigh wound vac in place Psychiatric: He has a normal mood and affect. His behavior is normal.     Lab Results Recent Labs    05/30/17 0346  WBC 6.0  HGB 9.3*  HCT 29.1*    Microbiology: 5/14 blood cx MSSA 5/15 blood cx NGTD Studies/Results: No results found.   Assessment/Plan:  57yo M with  MSSA deep thigh abscess with vascular graft infection s/p debridement and revision of portion of right upper leg bypass graft using left arm basilic vein. Hx of IVDU poses challenges for dispo planning. Since patient is refusing to go to SNF, would recommend he stays at minimum for addn 2-4 wk for IV abtx. Standard of care is to using IV therapy x 6 wk before transitioning to oral therapy. Since he is refusing to stay beyond morning of 4/24, we could consider giving him oritavancin (which is a long acting equivalent of vancomycin to stay in system for 10 days and have him come back for an addn doses x 2 ) arranged through our clinic. Would keep him on rifampin. Will arrange with pharmacy to give dose on 5/23.    [facility] for Infectious Diseases Cell: [phone] Pager: [phone]  06/01/2017, 8:34 PM

## 2017-06-02 MED ORDER — ORITAVANCIN DIPHOSPHATE 400 MG IV SOLR
1200.0000 mg | Freq: Once | INTRAVENOUS | Status: AC
  Administered 2017-06-03: 1200 mg via INTRAVENOUS
  Filled 2017-06-02: qty 120

## 2017-06-02 NOTE — Plan of Care (Signed)
  Problem: Education: Goal: Knowledge of General Education information will improve Outcome: Progressing   Problem: Health Behavior/Discharge Planning: Goal: Ability to manage health-related needs will improve Outcome: Progressing   Problem: Clinical Measurements: Goal: Ability to maintain clinical measurements within normal limits will improve Outcome: Progressing   

## 2017-06-02 NOTE — Progress Notes (Addendum)
    [facility]    Date of Admission:  05/25/2017   Total days of antibiotics 9   ID: [name] is a 57 y.o. male with MSSA cellulitis, deep wound,  Active Problems:   Cellulitis    Subjective: Threatening to leave ama, wants to smoke, and go home. Afebrile. Less leg pain  Medications:  . clopidogrel  75 mg Oral Daily  . docusate sodium  100 mg Oral Daily  . heparin  5,000 Units Subcutaneous Q8H  . hydrOXYzine  25 mg Oral BID  . nicotine  21 mg Transdermal Daily  . pantoprazole  40 mg Oral Daily  . pregabalin  75 mg Oral BID  . rifampin  300 mg Oral Q12H    Objective: Vital signs in last 24 hours: Temp:  [97.6 F (36.4 C)-98.1 F (36.7 C)] 97.6 F (36.4 C) (05/22 1317) Pulse Rate:  [84-91] 91 (05/22 1317) Resp:  [13-18] 16 (05/22 1317) BP: (115-137)/(57-75) 115/57 (05/22 1317) SpO2:  [98 %-100 %] 98 % (05/22 1317) Physical Exam  Constitutional: He is oriented to person, place, and time. He appears chronically ill, older than stated age and well-nourished. No distress. Laying in bed. HENT: edentulous Mouth/Throat: Oropharynx is clear and moist. No oropharyngeal exudate.  Cardiovascular: Normal rate, regular rhythm and normal heart sounds. Exam reveals no gallop and no friction rub.  No murmur heard.  Pulmonary/Chest: Effort normal and breath sounds normal. No respiratory distress. He has no wheezes.  Abdominal: Soft. Bowel sounds are normal. He exhibits no distension. There is no tenderness.  Ext: right thight wound vac in place Skin: Skin is warm and dry. No rash noted. No erythema.  Psychiatric: He has a normal mood and affect. His behavior is normal.   Lab Results reviewed Microbiology: 5/14 blood cx x 2 MSSA 5/15 blood cx ngtd Studies/Results: No results found.   Assessment/Plan: mssa deep tissue infection with vascular graft infection s/p revision and I x D.- not a candidate to go home with picc line and is not willing to go  to SNF. Will plan on giving oritavancin today x 1 then recommend to discharge on cipro 750mg  po bid plus rifampin 300mg  po bid x 5 weeks.  Will plan to see back in 10d at [place] [facility] to arrange 2nd dose of oritavancin.  [facility] for Infectious Diseases Cell: [phone] Pager: [phone]  06/02/2017, 2:01 PM

## 2017-06-02 NOTE — Progress Notes (Signed)
Pt agreeing to stay. Wound-vac replaced. Home vac ordered for pt. Pt re-educated that if he leaves against medical advice, he will not be given any scripts. Will continue to monitor.

## 2017-06-02 NOTE — Progress Notes (Signed)
Pt seen unhooking wound vac and  leaving floor to smoke. Upon return to the unit, pt demanding to leave. PA and MD paged. Will remove wound vac. And let pt AMA.

## 2017-06-02 NOTE — Progress Notes (Signed)
Occupational Therapy Treatment Patient Details Name: [name] MRN: [id number] DOB: 06-12-1960 Today's Date: 06/02/2017    History of present illness Pt is a 57 year old male with history of chronic heroin use presented to the ED with worsening anxiety as well as chronic pain.  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 He underwent surgical revision of graft 05/28/17. PMH consists of CVA, chronic pain, HTN, COPD, PE, PVD, and DM.    OT comments  Pt progressing towards established OT goals. Providing education on LB dressing with AE. Pt demonstrating and verbalizing understanding and donning/doffing socks with supervision. Educating pt on wound vac management with LB dressing; pt verbalized understanding. Pt performing tub transfer with Min Guard A demonstrating understanding of safe technique. Discussed need for sponge baths initially. Answering all of pt's questions. All acute OT needs met and will sign off. Continue to recommend dc home once medically stable per physician.    Follow Up Recommendations  No OT follow up;Supervision - Intermittent    Equipment Recommendations  None recommended by OT    Recommendations for Other Services      Precautions / Restrictions Precautions Precautions: Fall;Other (comment) Precaution Comments: wound vac       Mobility Bed Mobility Overal bed mobility: Modified Independent Bed Mobility: Supine to Sit;Sit to Supine     Supine to sit: Modified independent (Device/Increase time) Sit to supine: Modified independent (Device/Increase time)   General bed mobility comments: Increased time  Transfers Overall transfer level: Needs assistance   Transfers: Sit to/from Stand Sit to Stand: Supervision         General transfer comment: supervision for safety    Balance Overall balance assessment: Needs assistance Sitting-balance support: No upper extremity  supported;Feet supported Sitting balance-Leahy Scale: Good     Standing balance support: During functional activity Standing balance-Leahy Scale: Fair                             ADL either performed or assessed with clinical judgement   ADL Overall ADL's : Needs assistance/impaired                     Lower Body Dressing: Min guard;Sit to/from stand;With adaptive equipment Lower Body Dressing Details (indicate cue type and reason): Educating pt on AE for LB dressing. Pt demosntrating understanding. Issuing sock aide and reacher. Pt appreciative. Educating pt on compensatory techniques for donning pants (donning RLE first) as well as wound vac management. Pt verbalized understanding.          Tub/ Shower Transfer: Tub transfer;Ambulation;Min Chief Executive Officer Details (indicate cue type and reason): Pt performing simulated tub transfer with Min Guard A. Discussed need for sponge baths initially Functional mobility during ADLs: Min guard General ADL Comments: Educating pt on LB dressing with AE and tub transfers. Pt demonstrating understanding. Answering allpt questions.      Vision       Perception     Praxis      Cognition Arousal/Alertness: Awake/alert Behavior During Therapy: Impulsive Overall Cognitive Status: History of cognitive impairments - at baseline                                 General Comments: Pt following all cues, verbalizing and demosntrating understanding of education, and presenting with  awarness of IV line and wound vac.         Exercises     Shoulder Instructions       General Comments      Pertinent Vitals/ Pain       Pain Assessment: Faces Faces Pain Scale: Hurts a little bit Pain Location: RLE Pain Descriptors / Indicators: Guarding;Grimacing Pain Intervention(s): Monitored during session;Repositioned  Home Living                                          Prior  Functioning/Environment              Frequency  Min 2X/week        Progress Toward Goals  OT Goals(current goals can now be found in the care plan section)  Progress towards OT goals: Goals met/education completed, patient discharged from OT  Acute Rehab OT Goals Patient Stated Goal: decrease pain, walk OT Goal Formulation: With patient Time For Goal Achievement: 06/13/17 Potential to Achieve Goals: Good ADL Goals Pt Will Perform Grooming: with modified independence;standing Pt Will Perform Lower Body Bathing: with modified independence;sit to/from stand Pt Will Perform Lower Body Dressing: with modified independence;sit to/from stand Pt Will Transfer to Toilet: with modified independence;ambulating Pt Will Perform Toileting - Clothing Manipulation and hygiene: with modified independence;sit to/from stand Pt Will Perform Tub/Shower Transfer: Tub transfer;ambulating;rolling walker  Plan Discharge plan remains appropriate;All goals met and education completed, patient discharged from OT services    Co-evaluation                 AM-PAC PT "6 Clicks" Daily Activity     Outcome Measure   Help from another person eating meals?: None Help from another person taking care of personal grooming?: None Help from another person toileting, which includes using toliet, bedpan, or urinal?: A Little Help from another person bathing (including washing, rinsing, drying)?: A Little Help from another person to put on and taking off regular upper body clothing?: None Help from another person to put on and taking off regular lower body clothing?: A Little 6 Click Score: 21    End of Session    OT Visit Diagnosis: Unsteadiness on feet (R26.81);Pain   Activity Tolerance Patient tolerated treatment well   Patient Left with call bell/phone within reach;in bed   Nurse Communication Mobility status        Time: 9702-6378 OT Time Calculation (min): 13 min  Charges: OT General  Charges $OT Visit: 1 Visit OT Treatments $Self Care/Home Management : 8-22 mins  [place], OTR/L Acute Rehab Pager: [phone] Office: [place] 06/02/2017, 5:35 PM

## 2017-06-02 NOTE — Consult Note (Addendum)
[place] Nurse wound consult note Reason for Consult: Right lateral leg wound VAC change Wound type: Surgical site Measurement: 11 cm x 4 cm x 1.2 cm Wound bed: 60% granulation tissue, 40% pearly tendonous material, no slough or necrotic tissue Drainage (amount, consistency, odor) Serosanginous in cannister; no odor Periwound: Intact skin, intact surgical incision line above and below the VAC dressing site. Dressing procedure/placement/frequency: VAC dressing change M-W-F.  The patient tolerated the change procedure very well.  One piece of black foam placed in the wound bed.  A no leak seal obtained.  Patient up walking in the hall post dressing change. This is a non-complicated VAC dressing change.  It is highly manageable by bedside RNs. Monitor the wound area(s) for worsening of condition such as: Signs/symptoms of infection,  Increase in size,  Development of or worsening of odor, Development of pain, or increased pain at the affected locations.  Notify the medical team if any of these develop.  Thank you for the consult.  Val Riles, RN, MSN, CWOCN, CNS-BC, pager [phone]

## 2017-06-02 NOTE — Progress Notes (Signed)
[facility] [place]  Subjective  - He is very anxious and a little hateful daily.  He wants to leave and he wants to smoke.   Objective 137/67 84 98.1 F (36.7 C) (Oral) 13 98%  Intake/Output Summary (Last 24 hours) at 06/02/2017 2774 Last data filed at 06/02/2017 0300 Gross per 24 hour  Intake 120 ml  Output 1000 ml  Net -880 ml    Palpable DP and palpable graft pulse Groin incision healing well Wound vac in place working well Heart RRR Lungs non labored breathing  Assessment/Planning: POD # 4 PROCEDURE:Revision with rerouting of basilic vein from the left upper arm around the infected segment of the femoral to anterior tibial bypass, debridement of right thigh and VAC placement  I have asked him to be patient and try and make it until Friday so he can receive IV antibiotics and then transition to Oral.   I will have wound care nurses change the wound vac today.    Recommendation per DR. Snider with ID:  "Hx of IVDU poses challenges for dispo planning. Since patient is refusing to go to SNF, would recommend he stays at minimum for addn 2-4 wk for IV abtx. Standard of care is to using IV therapy x 6 wk before transitioning to oral therapy. Since he is refusing to stay beyond morning of 4/24, we could consider giving him oritavancin (which is a long acting equivalent of vancomycin to stay in system for 10 days and have him come back for an addn doses x 2 ) arranged through our clinic. Would keep him on rifampin. Will arrange with pharmacy to give dose on 5/23."    Roxy Horseman 06/02/2017 7:06 AM --  Laboratory Lab Results: No results for input(s): WBC, HGB, HCT, PLT in the last 72 hours. BMET No results for input(s): NA, K, CL, CO2, GLUCOSE, BUN, CREATININE, CALCIUM in the last 72 hours.  COAG Lab Results  Component Value Date   INR 1.13 05/28/2017   INR 1.32 05/25/2017   INR 1.13 03/17/2017   No results found for: PTT

## 2017-06-03 DIAGNOSIS — L02415 Cutaneous abscess of right lower limb: Secondary | ICD-10-CM

## 2017-06-03 DIAGNOSIS — F1191 Opioid use, unspecified, in remission: Secondary | ICD-10-CM

## 2017-06-03 DIAGNOSIS — Z87898 Personal history of other specified conditions: Secondary | ICD-10-CM

## 2017-06-03 DIAGNOSIS — T827XXA Infection and inflammatory reaction due to other cardiac and vascular devices, implants and grafts, initial encounter: Secondary | ICD-10-CM

## 2017-06-03 DIAGNOSIS — R7881 Bacteremia: Secondary | ICD-10-CM

## 2017-06-03 DIAGNOSIS — L03115 Cellulitis of right lower limb: Secondary | ICD-10-CM

## 2017-06-03 MED ORDER — RIFAMPIN 300 MG PO CAPS: 300.0000 mg | ORAL_CAPSULE | Freq: Two times a day (BID) | ORAL | 0 refills | Status: AC

## 2017-06-03 MED ORDER — CEPHALEXIN 500 MG PO CAPS
500.0000 mg | ORAL_CAPSULE | Freq: Four times a day (QID) | ORAL | Status: DC
  Administered 2017-06-03 (×2): 500 mg via ORAL
  Filled 2017-06-03 (×2): qty 1

## 2017-06-03 NOTE — Care Management Note (Signed)
Case Management Note [name] Gibbons RN, BSN Unit 4E-Case Manager [phone]  Patient Details  Name: [name] MRN: [id number] Date of Birth: Apr 01, 1960  Subjective/Objective:  Pt admitted with cellulitis - infected bypass graft. Plan for I&D on 5/17                 Action/Plan: PTA pt lived at home- hx of IVDU, CM to follow for transition of care needs post I&D  Expected Discharge Date:                  Expected Discharge Plan:  [place]  In-House Referral:     Discharge planning Services  CM Consult  Post Acute Care Choice:  [place], Durable Medical Equipment Choice offered to:  Patient  DME Arranged:  Vac DME Agency:  [place]:  RN [facility] Agency:  [place]  Status of Service:  Completed, signed off  If discussed at [place] of Stay Meetings, dates discussed:  5/21  Discharge Disposition:   Additional Comments:  06/03/17- 0830- [name] Gibbons RN, CM- pt was threatening to leave AMA yesterday- had [facility] RN bring home wound VAC which had been approved and place on pt at bedside- pt at this time has agreed again to stay till Friday 5/24- [facility] will follow for [facility] needs- wound VAC drsg changes- Pt is on [facility] home wound VAC at this time.   06/01/17- 1100- [name] Fessel RN, CM- per ID pt will need 6 wks of IV abx- however pt not agreeable to SNF placement for IV abx (hx IVDU) and therefor plan will be to d/c home with po abx coarse- will try to keep pt in house for IV abx as long as possible (2wks per ID)- pt will need HHRN and wound VAC for discharge- orders have been placed and [facility] wound VAC form has been signed- spoke with pt at bedside- pt has had wound vac in past and per pt [facility] services with [facility]- pt agreeable to using [facility] again for [facility] needs and home wound VAC- call made to [facility] with [facility] for [facility] and home wound VAC referral/DME needs- will start approval process for home wound VAC- will potential d/c of 5/24.    Dawayne Patricia, RN 06/03/2017, 8:39 AM

## 2017-06-03 NOTE — Progress Notes (Signed)
ID PROGRESS NOTE  57yo M with MSSA bacteremia c/b vascular graft infection s/p revision  Patient unwilling to stay longer to finish course of IV abtx at snf or hospital, and high risk to send home with picc line. Alternatively, he was oritavancin given yesterday without difficulty (given early since risk of leaving AMA).    Plan for discharge:  June 3rd at 2:45pm at [facility] -already on discharge avs Send out on keflex 500mg  QID plus rifampin 300mg  bid x 6 months  Will sign off

## 2017-06-03 NOTE — Progress Notes (Addendum)
[facility] [place]  Subjective  - Doing well this am.  Long acting IV antibiotics started this am.   Objective (!) 157/74 99 98 F (36.7 C) (Oral) 16 99%  Intake/Output Summary (Last 24 hours) at 06/03/2017 0701 Last data filed at 06/03/2017 0600 Gross per 24 hour  Intake -  Output 1751 ml  Net -1751 ml    Palpable right DP, palpable graft pulse. Right leg incisions healing , wound vac to suction intact lateal incision. Heart RRR Lungs non labored breathing Gen NAD  Assessment/Planning: POD # 5  PROCEDURE:Revision with rerouting of basilic vein from the left upper arm around the infected segment of the femoral to anterior tibial bypass, debridement of right thigh and VAC placement  Treatment plan per DR. Snider: MSSA cellulitis right lateral thigh plan on giving oritavancin today (started this am) x 1 then recommend to discharge on cipro 750mg  po bid plus rifampin 300mg  po bid x 5 weeks. Will plan to see back in 10d at [place] clinic to arrange 2nd dose of oritavancin.   Patient has allergy to Cipro resulting in anaphylaxis we will need to find an alternative.  We appreciate all the help. Home wound vac was placed yesterday at bedside. Plan discharge tomorrow.     Roxy Horseman 06/03/2017 7:01 AM --  Laboratory Lab Results: No results for input(s): WBC, HGB, HCT, PLT in the last 72 hours. BMET No results for input(s): NA, K, CL, CO2, GLUCOSE, BUN, CREATININE, CALCIUM in the last 72 hours.  COAG Lab Results  Component Value Date   INR 1.13 05/28/2017   INR 1.32 05/25/2017   INR 1.13 03/17/2017   No results found for: PTT

## 2017-06-04 ENCOUNTER — Telehealth: Payer: Self-pay | Admitting: Vascular Surgery

## 2017-06-04 MED ORDER — OXYCODONE-ACETAMINOPHEN 10-325 MG PO TABS: 1.0000 | ORAL_TABLET | Freq: Four times a day (QID) | ORAL | 0 refills | Status: DC | PRN

## 2017-06-04 MED ORDER — PANTOPRAZOLE SODIUM 20 MG PO TBEC: 20.0000 mg | DELAYED_RELEASE_TABLET | Freq: Every day | ORAL | 0 refills | Status: DC

## 2017-06-04 MED ORDER — CEPHALEXIN 500 MG PO CAPS: 500.0000 mg | ORAL_CAPSULE | Freq: Four times a day (QID) | ORAL | 5 refills | Status: DC

## 2017-06-04 MED ORDER — HYDROXYZINE PAMOATE 25 MG PO CAPS: 25.0000 mg | ORAL_CAPSULE | Freq: Two times a day (BID) | ORAL | 0 refills | Status: DC

## 2017-06-04 MED ORDER — RIFAMPIN 300 MG PO CAPS: 300.0000 mg | ORAL_CAPSULE | Freq: Two times a day (BID) | ORAL | 5 refills | Status: DC

## 2017-06-04 MED ORDER — CLOPIDOGREL BISULFATE 75 MG PO TABS: 75.0000 mg | ORAL_TABLET | Freq: Every day | ORAL | 3 refills | Status: DC

## 2017-06-04 MED ORDER — CLOPIDOGREL BISULFATE 75 MG PO TABS: 75.0000 mg | ORAL_TABLET | Freq: Every day | ORAL | 0 refills | Status: DC

## 2017-06-04 MED ORDER — CEPHALEXIN 500 MG PO CAPS: 500.0000 mg | ORAL_CAPSULE | Freq: Four times a day (QID) | ORAL | 0 refills | Status: DC

## 2017-06-04 MED FILL — HYDROXYZINE PAM 25 MG CAP: 25 | 15 days supply | Qty: 30 | Fill #0

## 2017-06-04 MED FILL — PANTOPRAZOLE SOD DR 20 MG T: 20 | 30 days supply | Qty: 30 | Fill #0

## 2017-06-04 MED FILL — OXYCODONE-ACETAMINOPHEN 10-: 10-325 | 3 days supply | Qty: 12 | Fill #0

## 2017-06-04 MED FILL — raNITIdine HCL 150 MG TABS: 150 | 30 days supply | Qty: 60 | Fill #0

## 2017-06-04 MED FILL — rifAMPin 300 MG CAPS: 300 | 30 days supply | Qty: 60 | Fill #0

## 2017-06-04 MED FILL — CLOPIDOGREL 75 MG TABLET: 75 | 30 days supply | Qty: 30 | Fill #0

## 2017-06-04 MED FILL — CEPHALEXIN 500 MG CAPSULE: 500 | 30 days supply | Qty: 120 | Fill #0

## 2017-06-04 NOTE — Progress Notes (Signed)
[facility] [place]  Subjective  - Doing well over all.   Objective 129/80 81 97.6 F (36.4 C) (Oral) 15 99%  Intake/Output Summary (Last 24 hours) at 06/04/2017 0717 Last data filed at 06/04/2017 0600 Gross per 24 hour  Intake -  Output 1175 ml  Net -1175 ml    Palpable DP and graft pulse right LE Right groin soft and healing well Wound vac in place minimal drainage 50 cc Heart RRR Lungs non labored breathing  Assessment/Planning: POD # 6  PROCEDURE:Revision with rerouting of basilic vein from the left upper arm around the infected segment of the femoral to anterior tibial bypass, debridement of right thigh and VAC placement  Wound vac changes per [facility] RN 3 times a week.   I gave him 30 percocet's 10/325 1 q 6 PRN for pain at discharge. F/U with Dr. Donnetta Hutching in 2-3 weeks.  Plan for discharge:  June 3rd at 2:45pm at [facility] -already on discharge avs Send out on keflex 500mg  QID plus rifampin 300mg  bid x 6 months     [name] 06/04/2017 7:17 AM --  Laboratory Lab Results: No results for input(s): WBC, HGB, HCT, PLT in the last 72 hours. BMET No results for input(s): NA, K, CL, CO2, GLUCOSE, BUN, CREATININE, CALCIUM in the last 72 hours.  COAG Lab Results  Component Value Date   INR 1.13 05/28/2017   INR 1.32 05/25/2017   INR 1.13 03/17/2017   No results found for: PTT

## 2017-06-04 NOTE — Discharge Instructions (Signed)
 [facility] [place]  Discharge instructions  Lower Extremity Bypass Surgery  Please refer to the following instruction for your post-procedure care. Your surgeon or physician assistant will discuss any changes with you.  Activity  You are encouraged to walk as much as you can. You can slowly return to normal activities during the month after your surgery. Avoid strenuous activity and heavy lifting until your doctor tells you it's OK. Avoid activities such as vacuuming or swinging a golf club. Do not drive until your doctor give the OK and you are no longer taking prescription pain medications. It is also normal to have difficulty with sleep habits, eating and bowel movement after surgery. These will go away with time.  Bathing/Showering  You may shower after you go home. Do not soak in a bathtub, hot tub, or swim until the incision heals completely.  Incision Care  Clean your incision with mild soap and water. Shower every day. Pat the area dry with a clean towel. You do not need a bandage unless otherwise instructed. Do not apply any ointments or creams to your incision. If you have open wounds you will be instructed how to care for them or a visiting nurse may be arranged for you. If you have staples or sutures along your incision they will be removed at your post-op appointment. You may have skin glue on your incision. Do not peel it off. It will come off on its own in about one week. If you have a great deal of moisture in your groin, use a gauze help keep this area dry.  Diet  Resume your normal diet. There are no special food restrictions following this procedure. A low fat/ low cholesterol diet is recommended for all patients with vascular disease. In order to heal from your surgery, it is CRITICAL to get adequate nutrition. Your body requires vitamins, minerals, and protein. Vegetables are the best source of vitamins and minerals. Vegetables also provide the  perfect balance of protein. Processed food has little nutritional value, so try to avoid this.  Medications  Resume taking all your medications unless your doctor or nurse practitioner tells you not to. If your incision is causing pain, you may take over-the-counter pain relievers such as acetaminophen (Tylenol). If you were prescribed a stronger pain medication, please aware these medication can cause nausea and constipation. Prevent nausea by taking the medication with a snack or meal. Avoid constipation by drinking plenty of fluids and eating foods with high amount of fiber, such as fruits, vegetables, and grains. Take Colase 100 mg (an over-the-counter stool softener) twice a day as needed for constipation. Do not take Tylenol if you are taking prescription pain medications.  Follow Up  Our office will schedule a follow up appointment 2-3 weeks following discharge.  Please call us immediately for any of the following conditions  Severe or worsening pain in your legs or feet while at rest or while walking Increase pain, redness, warmth, or drainage (pus) from your incision site(s) Fever of 101 degree or higher The swelling in your leg with the bypass suddenly worsens and becomes more painful than when you were in the hospital If you have been instructed to feel your graft pulse then you should do so every day. If you can no longer feel this pulse, call the office immediately. Not all patients are given this instruction.  Leg swelling is common after leg bypass surgery.  The swelling should improve over a few months   following surgery. To improve the swelling, you may elevate your legs above the level of your heart while you are sitting or resting. Your surgeon or physician assistant may ask you to apply an ACE wrap or wear compression (TED) stockings to help to reduce swelling.  Reduce your risk of vascular disease  Stop smoking. If you would like help call [facility] at 1-800-QUIT-NOW  (1-800-784-8669) or [place] at 336-586-4000.  Manage your cholesterol Maintain a desired weight Control your diabetes weight Control your diabetes Keep your blood pressure down  If you have any questions, please call the office at [phone]   

## 2017-06-04 NOTE — Telephone Encounter (Signed)
-----   Message from Penni Homans, RN sent at 06/04/2017 10:53 AM EDT ----- Regarding: Appointment   ----- Message ----- From: Ulyses Amor, PA-C Sent: 06/04/2017   7:04 AM To: Vvs Charge Pool  S/P revision right LE by pass with infection.  Discharged on wound vac and antibiotics for 6 months.  F/U with Dr. Donnetta Hutching in 2-3 weeks.  MD only

## 2017-06-04 NOTE — Telephone Encounter (Signed)
sch appt 07/06/17 LC 830 am P/O MD s/p revision R LE bypass w Infection
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
## 2017-06-08 MED FILL — OXYCODONE-ACETAMINOPHEN 10-: 10-325 | 5 days supply | Qty: 18 | Fill #1

## 2017-06-11 ENCOUNTER — Telehealth: Payer: Self-pay | Admitting: *Deleted

## 2017-06-11 DIAGNOSIS — B9561 Methicillin susceptible Staphylococcus aureus infection as the cause of diseases classified elsewhere: Secondary | ICD-10-CM

## 2017-06-11 DIAGNOSIS — R110 Nausea: Secondary | ICD-10-CM

## 2017-06-11 DIAGNOSIS — R7881 Bacteremia: Principal | ICD-10-CM

## 2017-06-11 MED ORDER — ONDANSETRON HCL 8 MG PO TABS: 8.0000 mg | ORAL_TABLET | Freq: Three times a day (TID) | ORAL | 2 refills | Status: DC | PRN

## 2017-06-11 NOTE — Telephone Encounter (Signed)
Patient left message in triage, asking for something to help with nausea, said that the medicine he was given at discharge makes him sick to his stomach. Verbal order per Dr Baxter Flattery for ondansetron 8 mg PO q 8 hours #20 with 2 refills sent to pharmacy of patient's choice - [facility].  Landis Gandy, RN

## 2017-06-14 ENCOUNTER — Ambulatory Visit: Payer: Self-pay | Admitting: Infectious Diseases

## 2017-06-16 ENCOUNTER — Encounter (HOSPITAL_COMMUNITY): Payer: Self-pay | Admitting: Emergency Medicine

## 2017-06-16 ENCOUNTER — Emergency Department (HOSPITAL_COMMUNITY)
Admission: EM | Admit: 2017-06-16 | Discharge: 2017-06-17 | Discharge status: Against Medical Advice | Payer: Medicaid Other | Attending: Emergency Medicine | Admitting: Emergency Medicine

## 2017-06-16 DIAGNOSIS — Z7901 Long term (current) use of anticoagulants: Secondary | ICD-10-CM | POA: Insufficient documentation

## 2017-06-16 DIAGNOSIS — L089 Local infection of the skin and subcutaneous tissue, unspecified: Secondary | ICD-10-CM | POA: Diagnosis not present

## 2017-06-16 DIAGNOSIS — I509 Heart failure, unspecified: Secondary | ICD-10-CM | POA: Insufficient documentation

## 2017-06-16 DIAGNOSIS — Z9114 Patient's other noncompliance with medication regimen: Secondary | ICD-10-CM | POA: Diagnosis not present

## 2017-06-16 DIAGNOSIS — X58XXXA Exposure to other specified factors, initial encounter: Secondary | ICD-10-CM | POA: Diagnosis not present

## 2017-06-16 DIAGNOSIS — Z8673 Personal history of transient ischemic attack (TIA), and cerebral infarction without residual deficits: Secondary | ICD-10-CM | POA: Insufficient documentation

## 2017-06-16 DIAGNOSIS — S71101D Unspecified open wound, right thigh, subsequent encounter: Secondary | ICD-10-CM | POA: Diagnosis not present

## 2017-06-16 DIAGNOSIS — Z79899 Other long term (current) drug therapy: Secondary | ICD-10-CM | POA: Insufficient documentation

## 2017-06-16 DIAGNOSIS — T148XXA Other injury of unspecified body region, initial encounter: Secondary | ICD-10-CM

## 2017-06-16 DIAGNOSIS — I110 Hypertensive heart disease with heart failure: Secondary | ICD-10-CM | POA: Insufficient documentation

## 2017-06-16 DIAGNOSIS — Z8614 Personal history of Methicillin resistant Staphylococcus aureus infection: Secondary | ICD-10-CM | POA: Insufficient documentation

## 2017-06-16 DIAGNOSIS — J449 Chronic obstructive pulmonary disease, unspecified: Secondary | ICD-10-CM | POA: Diagnosis not present

## 2017-06-16 DIAGNOSIS — E119 Type 2 diabetes mellitus without complications: Secondary | ICD-10-CM | POA: Diagnosis not present

## 2017-06-16 DIAGNOSIS — F17210 Nicotine dependence, cigarettes, uncomplicated: Secondary | ICD-10-CM | POA: Diagnosis not present

## 2017-06-16 NOTE — ED Triage Notes (Signed)
BIB [facility] from hotel, pt reports back pain and leg pain. Pt has wound vac present to R thigh, was placed approx 3 weeks ago. Pt states he is no longer taking his abx.

## 2017-06-17 ENCOUNTER — Emergency Department (HOSPITAL_COMMUNITY): Payer: Medicaid Other

## 2017-06-17 LAB — I-STAT CHEM 8, ED
BUN: 15 mg/dL (ref 6–20)
CREATININE: 1.3 mg/dL — AB (ref 0.61–1.24)
Calcium, Ion: 1.14 mmol/L — ABNORMAL LOW (ref 1.15–1.40)
Chloride: 100 mmol/L — ABNORMAL LOW (ref 101–111)
GLUCOSE: 75 mg/dL (ref 65–99)
HCT: 34 % — ABNORMAL LOW (ref 39.0–52.0)
HEMOGLOBIN: 11.6 g/dL — AB (ref 13.0–17.0)
POTASSIUM: 4 mmol/L (ref 3.5–5.1)
Sodium: 136 mmol/L (ref 135–145)
TCO2: 26 mmol/L (ref 22–32)

## 2017-06-17 LAB — CBC WITH DIFFERENTIAL/PLATELET
Abs Immature Granulocytes: 0 10*3/uL (ref 0.0–0.1)
Basophils Absolute: 0 10*3/uL (ref 0.0–0.1)
Basophils Relative: 0 %
EOS ABS: 0.1 10*3/uL (ref 0.0–0.7)
EOS PCT: 2 %
HEMATOCRIT: 34.2 % — AB (ref 39.0–52.0)
Hemoglobin: 11 g/dL — ABNORMAL LOW (ref 13.0–17.0)
Immature Granulocytes: 0 %
LYMPHS ABS: 1.5 10*3/uL (ref 0.7–4.0)
Lymphocytes Relative: 16 %
MCH: 27.8 pg (ref 26.0–34.0)
MCHC: 32.2 g/dL (ref 30.0–36.0)
MCV: 86.4 fL (ref 78.0–100.0)
MONO ABS: 0.8 10*3/uL (ref 0.1–1.0)
MONOS PCT: 8 %
Neutro Abs: 6.9 10*3/uL (ref 1.7–7.7)
Neutrophils Relative %: 74 %
Platelets: 308 10*3/uL (ref 150–400)
RBC: 3.96 MIL/uL — ABNORMAL LOW (ref 4.22–5.81)
RDW: 16.1 % — AB (ref 11.5–15.5)
WBC: 9.3 10*3/uL (ref 4.0–10.5)

## 2017-06-17 MED ORDER — SODIUM CHLORIDE 0.9 % IV BOLUS
1000.0000 mL | Freq: Once | INTRAVENOUS | Status: AC
  Administered 2017-06-17: 1000 mL via INTRAVENOUS

## 2017-06-17 MED ORDER — HALOPERIDOL LACTATE 5 MG/ML IJ SOLN: 2.0000 mg | Freq: Once | INTRAMUSCULAR | Status: DC

## 2017-06-17 MED ORDER — VANCOMYCIN HCL IN DEXTROSE 1-5 GM/200ML-% IV SOLN: 1000.0000 mg | Freq: Once | INTRAVENOUS | Status: DC

## 2017-06-17 MED ORDER — KETOROLAC TROMETHAMINE 30 MG/ML IJ SOLN
30.0000 mg | Freq: Once | INTRAMUSCULAR | Status: AC
  Administered 2017-06-17: 30 mg via INTRAVENOUS
  Filled 2017-06-17: qty 1

## 2017-06-17 MED ORDER — PIPERACILLIN-TAZOBACTAM 3.375 G IVPB 30 MIN: 3.3750 g | Freq: Once | INTRAVENOUS | Status: DC

## 2017-06-17 NOTE — ED Notes (Signed)
Pt found to be smoking in the BR. Meghan, RN told pt that it was not allowed. Pt went back to room.

## 2017-06-17 NOTE — ED Notes (Signed)
Attempted to print sunquest labels. Error message.

## 2017-06-17 NOTE — ED Notes (Signed)
Patient transported to X-ray 
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
## 2017-06-17 NOTE — ED Notes (Signed)
Pt pacing around room. Has removed all equipment including IV, sts he needs something better for pain. This RN has attempted to educate pt on staying and waiting for EDP to come back in and talk to him. Pt adamant about needing something better for pain control. Called this RN a "goddamned nigger," for not being able to get him anything else for pain. EDP made aware of pt wanting to leave AMA.

## 2017-06-22 LAB — CULTURE, BLOOD (ROUTINE X 2)
Culture: NO GROWTH
Culture: NO GROWTH
SPECIAL REQUESTS: ADEQUATE

## 2017-07-06 ENCOUNTER — Ambulatory Visit (INDEPENDENT_AMBULATORY_CARE_PROVIDER_SITE_OTHER): Payer: Self-pay | Admitting: Vascular Surgery

## 2017-07-06 ENCOUNTER — Encounter: Payer: Self-pay | Admitting: Vascular Surgery

## 2017-07-06 VITALS — BP 110/61 | HR 70 | Temp 95.8°F

## 2017-07-06 DIAGNOSIS — I739 Peripheral vascular disease, unspecified: Secondary | ICD-10-CM

## 2017-07-06 DIAGNOSIS — I779 Disorder of arteries and arterioles, unspecified: Secondary | ICD-10-CM

## 2017-07-06 MED ORDER — ALBUTEROL SULFATE (2.5 MG/3ML) 0.083% IN NEBU: 2.5000 mg | INHALATION_SOLUTION | Freq: Four times a day (QID) | RESPIRATORY_TRACT | 0 refills | Status: DC | PRN

## 2017-07-06 MED ORDER — RIFAMPIN 300 MG PO CAPS: 300.0000 mg | ORAL_CAPSULE | Freq: Two times a day (BID) | ORAL | 5 refills | Status: DC

## 2017-07-06 MED ORDER — HYDROXYZINE PAMOATE 25 MG PO CAPS: 25.0000 mg | ORAL_CAPSULE | Freq: Two times a day (BID) | ORAL | 0 refills | Status: DC

## 2017-07-06 MED ORDER — OXYCODONE-ACETAMINOPHEN 10-325 MG PO TABS: 1.0000 | ORAL_TABLET | Freq: Four times a day (QID) | ORAL | 0 refills | Status: DC | PRN

## 2017-07-06 MED ORDER — PANTOPRAZOLE SODIUM 20 MG PO TBEC: 20.0000 mg | DELAYED_RELEASE_TABLET | Freq: Every day | ORAL | 0 refills | Status: DC

## 2017-07-06 MED ORDER — CLOPIDOGREL BISULFATE 75 MG PO TABS: 75.0000 mg | ORAL_TABLET | Freq: Every day | ORAL | 3 refills | Status: DC

## 2017-07-06 MED ORDER — ALBUTEROL SULFATE HFA 108 (90 BASE) MCG/ACT IN AERS: 2.0000 | INHALATION_SPRAY | RESPIRATORY_TRACT | 1 refills | Status: DC | PRN

## 2017-07-06 NOTE — Progress Notes (Signed)
Patient name: [name] MRN: [id number] DOB: 09-13-1960 Sex: male  REASON FOR VISIT: Follow-up recent revision of right femoral anterior tibial bypass  HPI: [name] is a 57 y.o. male here today for follow-up.  Continues to be in very rough shape.  He is homeless.  He reports that his wife is left him.  He had a abscess and disruption of his right femoral to anterior tibial vein bypass due to injecting heroin in the bypass.  He underwent resection of this area and had basilic vein harvest to route around this.  Healed.  He was attempting a VAC drain and this is been removed today  Current Outpatient Medications  Medication Sig Dispense Refill  . albuterol (PROVENTIL HFA;VENTOLIN HFA) 108 (90 Base) MCG/ACT inhaler Inhale 2 puffs into the lungs every 4 (four) hours as needed for wheezing or shortness of breath. 1 Inhaler 1  . albuterol (PROVENTIL) (2.5 MG/3ML) 0.083% nebulizer solution Take 3 mLs (2.5 mg total) by nebulization every 6 (six) hours as needed for wheezing or shortness of breath. 75 mL 0  . clopidogrel (PLAVIX) 75 MG tablet Take 1 tablet (75 mg total) by mouth daily. 30 tablet 3  . hydrOXYzine (VISTARIL) 25 MG capsule Take 1 capsule (25 mg total) by mouth 2 (two) times daily. Itching 30 capsule 0  . pantoprazole (PROTONIX) 20 MG tablet Take 1 tablet (20 mg total) by mouth daily before breakfast. 30 tablet 0  . ranitidine (ZANTAC) 150 MG tablet Take 1 tablet (150 mg total) by mouth every 12 (twelve) hours as needed for heartburn. 60 tablet 0  . rifampin (RIFADIN) 300 MG capsule Take 1 capsule (300 mg total) by mouth every 12 (twelve) hours. 60 capsule 5  . cephALEXin (KEFLEX) 500 MG capsule Take 1 capsule (500 mg total) by mouth 4 (four) times daily. (Patient not taking: Reported on 07/06/2017) 736 capsule 0  . cephALEXin (KEFLEX) 500 MG capsule Take 1 capsule (500 mg total) by mouth 4 (four) times daily. (Patient not taking: Reported on  07/06/2017) 120 capsule 5  . cetirizine-pseudoephedrine (ZYRTEC-D) 5-120 MG tablet Take 1 tablet by mouth daily. (Patient not taking: Reported on 05/14/2017) 30 tablet 0  . fluticasone (FLONASE) 50 MCG/ACT nasal spray Place 2 sprays into both nostrils daily. (Patient not taking: Reported on 05/14/2017) 1 g 0  . LYRICA 75 MG capsule TAKE 1 CAPSULE BY MOUTH THREE TIMES A DAY (Patient not taking: Reported on 07/06/2017) 90 capsule 0  . ondansetron (ZOFRAN) 8 MG tablet Take 1 tablet (8 mg total) by mouth every 8 (eight) hours as needed for nausea or vomiting. (Patient not taking: Reported on 07/06/2017) 20 tablet 2  . oxyCODONE-acetaminophen (PERCOCET) 10-325 MG tablet Take 1 tablet by mouth every 6 (six) hours as needed for pain. 30 tablet 0   No current facility-administered medications for this visit.      PHYSICAL EXAM: Vitals:   07/06/17 0848  BP: 110/61  Pulse: 70  Temp: (!) 95.8 F (35.4 C)  TempSrc: Oral  SpO2: 99%    GENERAL: The patient is a well-nourished male, in no acute distress. The vital signs are documented above. Excellent healing of his surgical incisions in his left arm and right leg.  Very superficial granulation tissue over a contracting wound on his lateral right thigh.  MEDICAL ISSUES: We will discontinue VAC dressing and start him on saline wet to dries.  He was given a large package of packing material.  As always he is  asking for narcotics.  I explained the critical importance for him to get back into a pain management clinic and have written him one final prescription for postoperative pain of Percocet 10-3 25 #30 no refills.  He understands that he will receive no more narcotic pain medicine from our office.  Did call in refills for all of his chronic medications since he is unable to establish with primary care.  I will see him again in 4 to 6 weeks for wound check   [name], MD [facility] Tel [phone] Pager  [phone]

## 2017-07-13 ENCOUNTER — Telehealth: Payer: Self-pay | Admitting: *Deleted

## 2017-07-13 NOTE — Telephone Encounter (Signed)
Phone call from [place]. Unable to locate patient to continue treatment. He has checked out of hotel he was in and have had no call from patient.

## 2017-08-10 ENCOUNTER — Ambulatory Visit: Payer: Medicaid Other | Admitting: Vascular Surgery

## 2017-08-18 ENCOUNTER — Encounter: Payer: Self-pay | Admitting: Vascular Surgery

## 2017-08-18 ENCOUNTER — Other Ambulatory Visit: Payer: Self-pay

## 2017-08-18 ENCOUNTER — Telehealth: Payer: Self-pay

## 2017-08-18 ENCOUNTER — Other Ambulatory Visit: Payer: Self-pay | Admitting: Vascular Surgery

## 2017-08-18 ENCOUNTER — Ambulatory Visit (INDEPENDENT_AMBULATORY_CARE_PROVIDER_SITE_OTHER): Payer: Self-pay | Admitting: Vascular Surgery

## 2017-08-18 VITALS — BP 104/57 | HR 78 | Temp 97.1°F | Resp 16 | Ht 73.0 in | Wt 147.0 lb

## 2017-08-18 DIAGNOSIS — I739 Peripheral vascular disease, unspecified: Secondary | ICD-10-CM

## 2017-08-18 DIAGNOSIS — K219 Gastro-esophageal reflux disease without esophagitis: Secondary | ICD-10-CM

## 2017-08-18 DIAGNOSIS — R7881 Bacteremia: Principal | ICD-10-CM

## 2017-08-18 DIAGNOSIS — R110 Nausea: Secondary | ICD-10-CM

## 2017-08-18 MED ORDER — HYDROXYZINE PAMOATE 25 MG PO CAPS: 25.0000 mg | ORAL_CAPSULE | Freq: Two times a day (BID) | ORAL | 1 refills | Status: DC

## 2017-08-18 MED ORDER — PANTOPRAZOLE SODIUM 20 MG PO TBEC: 20.0000 mg | DELAYED_RELEASE_TABLET | Freq: Every day | ORAL | 1 refills | Status: DC

## 2017-08-18 MED ORDER — ONDANSETRON HCL 8 MG PO TABS
8.0000 mg | ORAL_TABLET | Freq: Three times a day (TID) | ORAL | 1 refills | Status: DC | PRN
Start: 2017-08-18 — End: 2017-08-30

## 2017-08-18 MED ORDER — PREGABALIN 75 MG PO CAPS: ORAL_CAPSULE | ORAL | 0 refills | Status: DC

## 2017-08-18 MED ORDER — RIFAMPIN 300 MG PO CAPS: 300.0000 mg | ORAL_CAPSULE | Freq: Two times a day (BID) | ORAL | 5 refills | Status: DC

## 2017-08-18 MED ORDER — ALBUTEROL SULFATE (2.5 MG/3ML) 0.083% IN NEBU: 2.5000 mg | INHALATION_SOLUTION | Freq: Four times a day (QID) | RESPIRATORY_TRACT | 1 refills | Status: DC | PRN

## 2017-08-18 MED ORDER — CETIRIZINE-PSEUDOEPHEDRINE ER 5-120 MG PO TB12: 1.0000 | ORAL_TABLET | Freq: Every day | ORAL | 1 refills | Status: DC

## 2017-08-18 MED ORDER — CLOPIDOGREL BISULFATE 75 MG PO TABS: 75.0000 mg | ORAL_TABLET | Freq: Every day | ORAL | 3 refills | Status: DC

## 2017-08-18 MED ORDER — RANITIDINE HCL 150 MG PO TABS: 150.0000 mg | ORAL_TABLET | Freq: Two times a day (BID) | ORAL | 1 refills | Status: DC | PRN

## 2017-08-18 MED ORDER — FLUTICASONE PROPIONATE 50 MCG/ACT NA SUSP: 2.0000 | Freq: Every day | NASAL | 1 refills | Status: AC

## 2017-08-18 MED ORDER — ALBUTEROL SULFATE HFA 108 (90 BASE) MCG/ACT IN AERS: 2.0000 | INHALATION_SPRAY | RESPIRATORY_TRACT | 1 refills | Status: DC | PRN

## 2017-08-18 NOTE — Progress Notes (Signed)
Patient name: [name] MRN: [id number] DOB: [birth year] Sex: male  REASON FOR VISIT: Follow-up revision of right femoral to anterior tibial bypass  HPI: [name] is a 57 y.o. male here today for continued follow-up.  He had had disruption of the thigh portion of his right femoral to anterior tibial vein bypass related to injecting heroin into the vein graft.  He had initial attempt at repairing this directly by Dr. Oneida Alar.  He continued to have wound breakdown and disruption and blowout of this.  He underwent harvesting of his left basilic vein from the elbow to the axilla and had rerouting of the femoral to anterior tibial bypass around the infected region and resection of this infected region.  His bypass has remained state and he is here today for continued follow-up.  Unfortunately he freely admits that he is continued to inject heroin in the new portion of his basilic vein bypass around the femoral anterior tibial bypass.  He reports he cannot find primary care or pain management.  Current Outpatient Medications  Medication Sig Dispense Refill  . albuterol (PROVENTIL HFA;VENTOLIN HFA) 108 (90 Base) MCG/ACT inhaler Inhale 2 puffs into the lungs every 4 (four) hours as needed for wheezing or shortness of breath. 1 Inhaler 1  . albuterol (PROVENTIL) (2.5 MG/3ML) 0.083% nebulizer solution Take 3 mLs (2.5 mg total) by nebulization every 6 (six) hours as needed for wheezing or shortness of breath. 75 mL 0  . cephALEXin (KEFLEX) 500 MG capsule Take 1 capsule (500 mg total) by mouth 4 (four) times daily. 736 capsule 0  . cephALEXin (KEFLEX) 500 MG capsule Take 1 capsule (500 mg total) by mouth 4 (four) times daily. 120 capsule 5  . cetirizine-pseudoephedrine (ZYRTEC-D) 5-120 MG tablet Take 1 tablet by mouth daily. 30 tablet 0  . clopidogrel (PLAVIX) 75 MG tablet Take 1 tablet (75 mg total) by mouth daily. 30 tablet 3  . fluticasone (FLONASE) 50 MCG/ACT  nasal spray Place 2 sprays into both nostrils daily. 1 g 0  . hydrOXYzine (VISTARIL) 25 MG capsule Take 1 capsule (25 mg total) by mouth 2 (two) times daily. Itching 30 capsule 0  . LYRICA 75 MG capsule TAKE 1 CAPSULE BY MOUTH THREE TIMES A DAY 90 capsule 0  . ondansetron (ZOFRAN) 8 MG tablet Take 1 tablet (8 mg total) by mouth every 8 (eight) hours as needed for nausea or vomiting. 20 tablet 2  . pantoprazole (PROTONIX) 20 MG tablet Take 1 tablet (20 mg total) by mouth daily before breakfast. 30 tablet 0  . ranitidine (ZANTAC) 150 MG tablet Take 1 tablet (150 mg total) by mouth every 12 (twelve) hours as needed for heartburn. 60 tablet 0  . rifampin (RIFADIN) 300 MG capsule Take 1 capsule (300 mg total) by mouth every 12 (twelve) hours. 60 capsule 5  . oxyCODONE-acetaminophen (PERCOCET) 10-325 MG tablet Take 1 tablet by mouth every 6 (six) hours as needed for pain. (Patient not taking: Reported on 08/18/2017) 30 tablet 0   No current facility-administered medications for this visit.      PHYSICAL EXAM: Vitals:   08/18/17 0912  BP: (!) 104/57  Pulse: 78  Resp: 16  Temp: (!) 97.1 F (36.2 C)  TempSrc: Oral  SpO2: 97%  Weight: 147 lb (66.7 kg)  Height: 6\' 1"  (1.854 m)    GENERAL: The patient is a well-nourished male, in no acute distress. The vital signs are documented above. Excellent pulse throughout his femoral to anterior tibial  bypass with viable foot.  Does have obvious needle puncture sites in the rerouted basilic vein portion of his femoral to anterior tibial bypass  MEDICAL ISSUES: Had a long discussion with the patient.  I explained that this is been an extreme effort to maintain patency of his femoral to anterior tibial bypass.  Explained with this occluded he will certainly have amputation probably above-knee.  Explained that his graft will surely fail if he continues to inject it with heroin.  He reports that he has been unsuccessful in finding primary care.  I asked explained  that we will attempt to get him appointment with community wellness.  Also will rewrite his prescriptions other than pain medications since he does not have any refills on his medications.  He will see [place] again on an as-needed basis   [name], MD [facility] of [facility] Tel [phone] Pager [phone]

## 2017-08-18 NOTE — Progress Notes (Signed)
Rx was reprinted and signed since medication is unable to be sent electronically and signed by Dr. Bridgett Larsson.  Thurston Hole., LPN

## 2017-08-30 ENCOUNTER — Encounter: Payer: Self-pay | Admitting: Family Medicine

## 2017-08-30 ENCOUNTER — Ambulatory Visit: Payer: Medicaid Other | Attending: Family Medicine | Admitting: Family Medicine

## 2017-08-30 VITALS — BP 136/72 | HR 121 | Temp 98.7°F | Resp 18 | Ht 73.0 in | Wt 145.0 lb

## 2017-08-30 DIAGNOSIS — I509 Heart failure, unspecified: Secondary | ICD-10-CM | POA: Diagnosis not present

## 2017-08-30 DIAGNOSIS — E1151 Type 2 diabetes mellitus with diabetic peripheral angiopathy without gangrene: Secondary | ICD-10-CM | POA: Diagnosis not present

## 2017-08-30 DIAGNOSIS — Z8249 Family history of ischemic heart disease and other diseases of the circulatory system: Secondary | ICD-10-CM | POA: Insufficient documentation

## 2017-08-30 DIAGNOSIS — Z79899 Other long term (current) drug therapy: Secondary | ICD-10-CM | POA: Diagnosis not present

## 2017-08-30 DIAGNOSIS — R112 Nausea with vomiting, unspecified: Secondary | ICD-10-CM | POA: Diagnosis not present

## 2017-08-30 DIAGNOSIS — Z881 Allergy status to other antibiotic agents status: Secondary | ICD-10-CM | POA: Diagnosis not present

## 2017-08-30 DIAGNOSIS — G4730 Sleep apnea, unspecified: Secondary | ICD-10-CM | POA: Insufficient documentation

## 2017-08-30 DIAGNOSIS — Z79891 Long term (current) use of opiate analgesic: Secondary | ICD-10-CM | POA: Diagnosis not present

## 2017-08-30 DIAGNOSIS — Z885 Allergy status to narcotic agent status: Secondary | ICD-10-CM | POA: Diagnosis not present

## 2017-08-30 DIAGNOSIS — R000 Tachycardia, unspecified: Secondary | ICD-10-CM | POA: Diagnosis not present

## 2017-08-30 DIAGNOSIS — I110 Hypertensive heart disease with heart failure: Secondary | ICD-10-CM | POA: Diagnosis not present

## 2017-08-30 DIAGNOSIS — M79604 Pain in right leg: Secondary | ICD-10-CM | POA: Insufficient documentation

## 2017-08-30 DIAGNOSIS — Z91013 Allergy to seafood: Secondary | ICD-10-CM | POA: Insufficient documentation

## 2017-08-30 DIAGNOSIS — Z800 Family history of malignant neoplasm of digestive organs: Secondary | ICD-10-CM | POA: Diagnosis not present

## 2017-08-30 DIAGNOSIS — E46 Unspecified protein-calorie malnutrition: Secondary | ICD-10-CM

## 2017-08-30 DIAGNOSIS — G894 Chronic pain syndrome: Secondary | ICD-10-CM | POA: Diagnosis not present

## 2017-08-30 DIAGNOSIS — Z888 Allergy status to other drugs, medicaments and biological substances status: Secondary | ICD-10-CM | POA: Insufficient documentation

## 2017-08-30 DIAGNOSIS — E1165 Type 2 diabetes mellitus with hyperglycemia: Secondary | ICD-10-CM | POA: Diagnosis not present

## 2017-08-30 DIAGNOSIS — Z8673 Personal history of transient ischemic attack (TIA), and cerebral infarction without residual deficits: Secondary | ICD-10-CM | POA: Insufficient documentation

## 2017-08-30 DIAGNOSIS — J449 Chronic obstructive pulmonary disease, unspecified: Secondary | ICD-10-CM | POA: Insufficient documentation

## 2017-08-30 DIAGNOSIS — Z7902 Long term (current) use of antithrombotics/antiplatelets: Secondary | ICD-10-CM | POA: Insufficient documentation

## 2017-08-30 DIAGNOSIS — Z7983 Long term (current) use of bisphosphonates: Secondary | ICD-10-CM | POA: Insufficient documentation

## 2017-08-30 DIAGNOSIS — K219 Gastro-esophageal reflux disease without esophagitis: Secondary | ICD-10-CM | POA: Diagnosis not present

## 2017-08-30 DIAGNOSIS — M069 Rheumatoid arthritis, unspecified: Secondary | ICD-10-CM | POA: Insufficient documentation

## 2017-08-30 DIAGNOSIS — R739 Hyperglycemia, unspecified: Secondary | ICD-10-CM | POA: Diagnosis not present

## 2017-08-30 DIAGNOSIS — I739 Peripheral vascular disease, unspecified: Secondary | ICD-10-CM

## 2017-08-30 DIAGNOSIS — Z7952 Long term (current) use of systemic steroids: Secondary | ICD-10-CM | POA: Insufficient documentation

## 2017-08-30 LAB — POCT GLYCOSYLATED HEMOGLOBIN (HGB A1C): HbA1c, POC (prediabetic range): 5.8 % (ref 5.7–6.4)

## 2017-08-30 MED ORDER — ACETAMINOPHEN-CODEINE #3 300-30 MG PO TABS: 1.0000 | ORAL_TABLET | Freq: Three times a day (TID) | ORAL | 0 refills | Status: AC | PRN

## 2017-08-30 MED ORDER — ONDANSETRON HCL 4 MG PO TABS: 4.0000 mg | ORAL_TABLET | Freq: Three times a day (TID) | ORAL | 0 refills | Status: DC | PRN

## 2017-08-30 MED ORDER — ONDANSETRON 4 MG PO TBDP
4.0000 mg | ORAL_TABLET | Freq: Once | ORAL | Status: AC
  Administered 2017-08-30: 4 mg via ORAL

## 2017-08-30 NOTE — Patient Instructions (Signed)
Hyperglycemia Hyperglycemia occurs when the level of sugar (glucose) in the blood is too high. Glucose is a type of sugar that provides the body's main source of energy. Certain hormones (insulin and glucagon) control the level of glucose in the blood. Insulin lowers blood glucose, and glucagon increases blood glucose. Hyperglycemia can result from having too little insulin in the bloodstream, or from the body not responding normally to insulin. Hyperglycemia occurs most often in people who have diabetes (diabetes mellitus), but it can happen in people who do not have diabetes. It can develop quickly, and it can be life-threatening if it causes you to become severely dehydrated (diabetic ketoacidosis or hyperglycemic hyperosmolar state). Severe hyperglycemia is a medical emergency. What are the causes? If you have diabetes, hyperglycemia may be caused by:  Diabetes medicine.  Medicines that increase blood glucose or affect your diabetes control.  Not eating enough, or not eating often enough.  Changes in physical activity level.  Being sick or having an infection.  If you have prediabetes or undiagnosed diabetes:  Hyperglycemia may be caused by those conditions.  If you do not have diabetes, hyperglycemia may be caused by:  Certain medicines, including steroid medicines, beta-blockers, epinephrine, and thiazide diuretics.  Stress.  Serious illness.  Surgery.  Diseases of the pancreas.  Infection.  What increases the risk? Hyperglycemia is more likely to develop in people who have risk factors for diabetes, such as:  Having a family member with diabetes.  Having a gene for type 1 diabetes that is passed from parent to child (inherited).  Living in an area with cold weather conditions.  Exposure to certain viruses.  Certain conditions in which the body's disease-fighting (immune) system attacks itself (autoimmune disorders).  Being overweight or obese.  Having an  inactive (sedentary) lifestyle.  Having been diagnosed with insulin resistance.  Having a history of prediabetes, gestational diabetes, or polycystic ovarian syndrome (PCOS).  Being of American-Indian, African-American, Hispanic/Latino, or Asian/Pacific Islander descent.  What are the signs or symptoms? Hyperglycemia may not cause any symptoms. If you do have symptoms, they may include early warning signs, such as:  Increased thirst.  Hunger.  Feeling very tired.  Needing to urinate more often than usual.  Blurry vision.  Other symptoms may develop if hyperglycemia gets worse, such as:  Dry mouth.  Loss of appetite.  Fruity-smelling breath.  Weakness.  Unexpected or rapid weight gain or weight loss.  Tingling or numbness in the hands or feet.  Headache.  Skin that does not quickly return to normal after being lightly pinched and released (poor skin turgor).  Abdominal pain.  Cuts or bruises that are slow to heal.  How is this diagnosed? Hyperglycemia is diagnosed with a blood test to measure your blood glucose level. This blood test is usually done while you are having symptoms. Your health care provider may also do a physical exam and review your medical history. You may have more tests to determine the cause of your hyperglycemia, such as:  A fasting blood glucose (FBG) test. You will not be allowed to eat (you will fast) for at least 8 hours before a blood sample is taken.  An A1c (hemoglobin A1c) blood test. This provides information about blood glucose control over the previous 2-3 months.  An oral glucose tolerance test (OGTT). This measures your blood glucose at two times: ? After fasting. This is your baseline blood glucose level. ? Two hours after drinking a beverage that contains glucose.  How is   this treated? Treatment depends on the cause of your hyperglycemia. Treatment may include:  Taking medicine to regulate your blood glucose levels. If you  take insulin or other diabetes medicines, your medicine or dosage may be adjusted.  Lifestyle changes, such as exercising more, eating healthier foods, or losing weight.  Treating an illness or infection, if this caused your hyperglycemia.  Checking your blood glucose more often.  Stopping or reducing steroid medicines, if these caused your hyperglycemia.  If your hyperglycemia becomes severe and it results in hyperglycemic hyperosmolar state, you must be hospitalized and given IV fluids. Follow these instructions at home: General instructions  Take over-the-counter and prescription medicines only as told by your health care provider.  Do not use any products that contain nicotine or tobacco, such as cigarettes and e-cigarettes. If you need help quitting, ask your health care provider.  Limit alcohol intake to no more than 1 drink per day for nonpregnant women and 2 drinks per day for men. One drink equals 12 oz of beer, 5 oz of wine, or 1 oz of hard liquor.  Learn to manage stress. If you need help with this, ask your health care provider.  Keep all follow-up visits as told by your health care provider. This is important. Eating and drinking  Maintain a healthy weight.  Exercise regularly, as directed by your health care provider.  Stay hydrated, especially when you exercise, get sick, or spend time in hot temperatures.  Eat healthy foods, such as: ? Lean proteins. ? Complex carbohydrates. ? Fresh fruits and vegetables. ? Low-fat dairy products. ? Healthy fats.  Drink enough fluid to keep your urine clear or pale yellow. If you have diabetes:   Make sure you know the symptoms of hyperglycemia.  Follow your diabetes management plan, as told by your health care provider. Make sure you: ? Take your insulin and medicines as directed. ? Follow your exercise plan. ? Follow your meal plan. Eat on time, and do not skip meals. ? Check your blood glucose as often as directed.  Make sure to check your blood glucose before and after exercise. If you exercise longer or in a different way than usual, check your blood glucose more often. ? Follow your sick day plan whenever you cannot eat or drink normally. Make this plan in advance with your health care provider.  Share your diabetes management plan with people in your workplace, school, and household.  Check your urine for ketones when you are ill and as told by your health care provider.  Carry a medical alert card or wear medical alert jewelry. Contact a health care provider if:  Your blood glucose is at or above 240 mg/dL (13.3 mmol/L) for 2 days in a row.  You have problems keeping your blood glucose in your target range.  You have frequent episodes of hyperglycemia. Get help right away if:  You have difficulty breathing.  You have a change in how you think, feel, or act (mental status).  You have nausea or vomiting that does not go away. These symptoms may represent a serious problem that is an emergency. Do not wait to see if the symptoms will go away. Get medical help right away. Call your local emergency services (911 in the [place] Do not drive yourself to the hospital. Summary  Hyperglycemia occurs when the level of sugar (glucose) in the blood is too high.  Hyperglycemia is diagnosed with a blood test to measure your blood glucose level. This blood   test is usually done while you are having symptoms. Your health care provider may also do a physical exam and review your medical history.  If you have diabetes, follow your diabetes management plan as told by your health care provider.  Contact your health care provider if you have problems keeping your blood glucose in your target range. This information is not intended to replace advice given to you by your health care provider. Make sure you discuss any questions you have with your health care provider. Document Released: 06/24/2000 Document Revised:  09/16/2015 Document Reviewed: 09/16/2015 Elsevier Interactive Patient Education  2018 [employer]. Malnutrition Malnutrition is any condition in which nutrition is poor. There are many forms of malnutrition. A common form is having too little of one kind of nutrient (nutritional deficiency). Nutrients include proteins, minerals, carbohydrates, fats, and vitamins. They provide the body with energy and keep the body working normally. Malnutrition ranges from mild to severe. The condition affects the body's defense system (immune system). Because of this, people who are malnourished are more likely to develop health problems and get sick. What are the causes? Causes of malnutrition include:  Eating an unbalanced diet.  Eating too much of certain foods.  Eating too little.  Conditions that decrease the body's ability to use nutrients.  What increases the risk? Risk factors include:  Pregnancy and lactation. Women who are pregnant may become malnourished if they do not increase their nutrient intake. They are also susceptible to folic acid deficiency.  Increasing age. The body's ability to absorb nutrients decreases with age. This can contribute to iron, calcium, and vitamin D deficiencies.  Alcohol or drug dependency. Addiction often leads to a lifestyle in which proper nourishment is ignored. Dependency can also hurt the metabolism and the body's ability to absorb nutrients. Alcoholism is a major cause of thiamine deficiency and can lead to deficiencies of magnesium, zinc, and other vitamins.  Eating disorders, such as anorexia nervosa. People with these disorders may eat too little or too much.  Chewing or swallowing problems. People with these disorders may not eat enough.  Certain diseases, including: ? Long-lasting (chronic) diseases. Chronic diseases tend to affect the absorption of calcium, iron, and vitamins B12, A, D, E, and K. ? Liver disease. Liver disease affects the storage  of vitamins A and B12. It also interferes with the metabolism of protein and energy sources. ? Kidney disease. Kidney disease may cause deficiencies of protein, iron, and vitamin D. ? Cancer or AIDS. These diseases can cause a loss of appetite. ? Cystic fibrosis. This disease can make it difficult for the body to absorb nutrients.  Certain diets, including. ? The vegetarian diet. Vegetarians are at risk for iron deficiency. ? The vegan diet. Vegans are susceptible to vitamin B12, calcium, iron, vitamin D, and zinc deficiencies. ? The fruitarian diet. This diet can be deficient in protein, sodium, and many micronutrients. ? Many commercial "fad" diets, including those that claim to enhance well-being and reduce weight. ? Very low calorie diets.  Low income. People with a low income may have trouble paying for nutritious foods.  What are the signs or symptoms? Signs and symptoms depend on the kind of malnutrition you have. Common symptoms include:  Fatigue.  Weakness.  Dizziness.  Fainting  Weight loss.  Poor immune response.  Lack of menstruation.  Hair loss.  Poor memory.  How is this diagnosed? Malnutrition may be diagnosed by:  A medical history.  A dietary history.  A physical  exam. This may include a measurement of your body mass index (BMI).  Blood tests.  How is this treated? Treatments vary depending on the cause of the malnutrition. Common treatments include:  Dietary changes.  Dietary supplements, such as vitamins and minerals.  Treatment of any underlying conditions.  Follow these instructions at home:  Eat a balanced diet.  Take dietary supplements as directed by your health care provider.  Exercise regularly. Exercising can improve appetite.  Keep all follow-up visits as directed by your health care provider. This is important. How is this prevented? Eating a well-balanced diet helps to prevent most forms of malnutrition. Contact a health  care provider if:  You have increased weakness or fatigue.  You faint.  You stop menstruating.  You have rapid hair loss.  You have unexpected weight loss. This information is not intended to replace advice given to you by your health care provider. Make sure you discuss any questions you have with your health care provider. Document Released: 11/14/2004 Document Revised: 06/06/2015 Document Reviewed: 08/25/2013 Elsevier Interactive Patient Education  2018 [employer]. Malnutrition Malnutrition is any condition in which nutrition is poor. There are many forms of malnutrition. A common form is having too little of one kind of nutrient (nutritional deficiency). Nutrients include proteins, minerals, carbohydrates, fats, and vitamins. They provide the body with energy and keep the body working normally. Malnutrition ranges from mild to severe. The condition affects the body's defense system (immune system). Because of this, people who are malnourished are more likely to develop health problems and get sick. What are the causes? Causes of malnutrition include:  Eating an unbalanced diet.  Eating too much of certain foods.  Eating too little.  Conditions that decrease the body's ability to use nutrients.  What increases the risk? Risk factors include:  Pregnancy and lactation. Women who are pregnant may become malnourished if they do not increase their nutrient intake. They are also susceptible to folic acid deficiency.  Increasing age. The body's ability to absorb nutrients decreases with age. This can contribute to iron, calcium, and vitamin D deficiencies.  Alcohol or drug dependency. Addiction often leads to a lifestyle in which proper nourishment is ignored. Dependency can also hurt the metabolism and the body's ability to absorb nutrients. Alcoholism is a major cause of thiamine deficiency and can lead to deficiencies of magnesium, zinc, and other vitamins.  Eating disorders,  such as anorexia nervosa. People with these disorders may eat too little or too much.  Chewing or swallowing problems. People with these disorders may not eat enough.  Certain diseases, including: ? Long-lasting (chronic) diseases. Chronic diseases tend to affect the absorption of calcium, iron, and vitamins B12, A, D, E, and K. ? Liver disease. Liver disease affects the storage of vitamins A and B12. It also interferes with the metabolism of protein and energy sources. ? Kidney disease. Kidney disease may cause deficiencies of protein, iron, and vitamin D. ? Cancer or AIDS. These diseases can cause a loss of appetite. ? Cystic fibrosis. This disease can make it difficult for the body to absorb nutrients.  Certain diets, including. ? The vegetarian diet. Vegetarians are at risk for iron deficiency. ? The vegan diet. Vegans are susceptible to vitamin B12, calcium, iron, vitamin D, and zinc deficiencies. ? The fruitarian diet. This diet can be deficient in protein, sodium, and many micronutrients. ? Many commercial "fad" diets, including those that claim to enhance well-being and reduce weight. ? Very low calorie diets.  Low income. People with a low income may have trouble paying for nutritious foods.  What are the signs or symptoms? Signs and symptoms depend on the kind of malnutrition you have. Common symptoms include:  Fatigue.  Weakness.  Dizziness.  Fainting  Weight loss.  Poor immune response.  Lack of menstruation.  Hair loss.  Poor memory.  How is this diagnosed? Malnutrition may be diagnosed by:  A medical history.  A dietary history.  A physical exam. This may include a measurement of your body mass index (BMI).  Blood tests.  How is this treated? Treatments vary depending on the cause of the malnutrition. Common treatments include:  Dietary changes.  Dietary supplements, such as vitamins and minerals.  Treatment of any underlying  conditions.  Follow these instructions at home:  Eat a balanced diet.  Take dietary supplements as directed by your health care provider.  Exercise regularly. Exercising can improve appetite.  Keep all follow-up visits as directed by your health care provider. This is important. How is this prevented? Eating a well-balanced diet helps to prevent most forms of malnutrition. Contact a health care provider if:  You have increased weakness or fatigue.  You faint.  You stop menstruating.  You have rapid hair loss.  You have unexpected weight loss. This information is not intended to replace advice given to you by your health care provider. Make sure you discuss any questions you have with your health care provider. Document Released: 11/14/2004 Document Revised: 06/06/2015 Document Reviewed: 08/25/2013 Elsevier Interactive Patient Education  [employer].
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
## 2017-08-31 LAB — CBC WITH DIFFERENTIAL/PLATELET
Basophils Absolute: 0 x10E3/uL (ref 0.0–0.2)
Basos: 0 %
EOS (ABSOLUTE): 0.1 x10E3/uL (ref 0.0–0.4)
Eos: 1 %
Hematocrit: 39.4 % (ref 37.5–51.0)
Hemoglobin: 12 g/dL — ABNORMAL LOW (ref 13.0–17.7)
Immature Grans (Abs): 0 x10E3/uL (ref 0.0–0.1)
Immature Granulocytes: 0 %
Lymphocytes Absolute: 1.2 x10E3/uL (ref 0.7–3.1)
Lymphs: 9 %
MCH: 23.6 pg — ABNORMAL LOW (ref 26.6–33.0)
MCHC: 30.5 g/dL — ABNORMAL LOW (ref 31.5–35.7)
MCV: 78 fL — ABNORMAL LOW (ref 79–97)
Monocytes Absolute: 0.5 x10E3/uL (ref 0.1–0.9)
Monocytes: 4 %
Neutrophils Absolute: 10.5 x10E3/uL — ABNORMAL HIGH (ref 1.4–7.0)
Neutrophils: 86 %
Platelets: 341 x10E3/uL (ref 150–450)
RBC: 5.08 x10E6/uL (ref 4.14–5.80)
RDW: 17.9 % — ABNORMAL HIGH (ref 12.3–15.4)
WBC: 12.4 x10E3/uL — ABNORMAL HIGH (ref 3.4–10.8)

## 2017-08-31 LAB — COMPREHENSIVE METABOLIC PANEL WITH GFR
ALT: 14 IU/L (ref 0–44)
AST: 20 IU/L (ref 0–40)
Albumin/Globulin Ratio: 0.9 — ABNORMAL LOW (ref 1.2–2.2)
Albumin: 4.1 g/dL (ref 3.5–5.5)
Alkaline Phosphatase: 77 IU/L (ref 39–117)
BUN/Creatinine Ratio: 19 (ref 9–20)
BUN: 20 mg/dL (ref 6–24)
Bilirubin Total: 0.7 mg/dL (ref 0.0–1.2)
CO2: 20 mmol/L (ref 20–29)
Calcium: 9.4 mg/dL (ref 8.7–10.2)
Chloride: 99 mmol/L (ref 96–106)
Creatinine, Ser: 1.07 mg/dL (ref 0.76–1.27)
GFR calc Af Amer: 88 mL/min/1.73
GFR calc non Af Amer: 76 mL/min/1.73
Globulin, Total: 4.4 g/dL (ref 1.5–4.5)
Glucose: 113 mg/dL — ABNORMAL HIGH (ref 65–99)
Potassium: 6.3 mmol/L — ABNORMAL HIGH (ref 3.5–5.2)
Sodium: 137 mmol/L (ref 134–144)
Total Protein: 8.5 g/dL (ref 6.0–8.5)

## 2017-09-03 ENCOUNTER — Other Ambulatory Visit: Payer: Self-pay

## 2017-09-03 ENCOUNTER — Telehealth: Payer: Self-pay | Admitting: *Deleted

## 2017-09-03 ENCOUNTER — Emergency Department (HOSPITAL_COMMUNITY)
Admission: EM | Admit: 2017-09-03 | Discharge: 2017-09-04 | Discharge status: Against Medical Advice | Payer: Medicaid Other | Attending: Emergency Medicine | Admitting: Emergency Medicine

## 2017-09-03 ENCOUNTER — Encounter (HOSPITAL_COMMUNITY): Payer: Self-pay | Admitting: *Deleted

## 2017-09-03 DIAGNOSIS — Z79899 Other long term (current) drug therapy: Secondary | ICD-10-CM | POA: Insufficient documentation

## 2017-09-03 DIAGNOSIS — J449 Chronic obstructive pulmonary disease, unspecified: Secondary | ICD-10-CM | POA: Diagnosis not present

## 2017-09-03 DIAGNOSIS — E119 Type 2 diabetes mellitus without complications: Secondary | ICD-10-CM | POA: Insufficient documentation

## 2017-09-03 DIAGNOSIS — I110 Hypertensive heart disease with heart failure: Secondary | ICD-10-CM | POA: Insufficient documentation

## 2017-09-03 DIAGNOSIS — R799 Abnormal finding of blood chemistry, unspecified: Secondary | ICD-10-CM | POA: Diagnosis not present

## 2017-09-03 DIAGNOSIS — I509 Heart failure, unspecified: Secondary | ICD-10-CM | POA: Diagnosis not present

## 2017-09-03 DIAGNOSIS — F17210 Nicotine dependence, cigarettes, uncomplicated: Secondary | ICD-10-CM | POA: Diagnosis not present

## 2017-09-03 DIAGNOSIS — R899 Unspecified abnormal finding in specimens from other organs, systems and tissues: Secondary | ICD-10-CM

## 2017-09-03 LAB — COMPREHENSIVE METABOLIC PANEL
ALBUMIN: 3.5 g/dL (ref 3.5–5.0)
ALT: 16 U/L (ref 0–44)
AST: 22 U/L (ref 15–41)
Alkaline Phosphatase: 71 U/L (ref 38–126)
Anion gap: 7 (ref 5–15)
BILIRUBIN TOTAL: 0.6 mg/dL (ref 0.3–1.2)
BUN: 21 mg/dL — AB (ref 6–20)
CHLORIDE: 106 mmol/L (ref 98–111)
CO2: 26 mmol/L (ref 22–32)
CREATININE: 1.03 mg/dL (ref 0.61–1.24)
Calcium: 8.9 mg/dL (ref 8.9–10.3)
GFR calc Af Amer: >60 mL/min (ref >60)
GFR calc non Af Amer: >60 mL/min (ref >60)
Glucose, Bld: 134 mg/dL — ABNORMAL HIGH (ref 70–99)
POTASSIUM: 4.6 mmol/L (ref 3.5–5.1)
Sodium: 139 mmol/L (ref 135–145)
TOTAL PROTEIN: 8 g/dL (ref 6.5–8.1)

## 2017-09-03 LAB — CBC
HEMATOCRIT: 34.5 % — AB (ref 39.0–52.0)
Hemoglobin: 10.4 g/dL — ABNORMAL LOW (ref 13.0–17.0)
MCH: 23.9 pg — ABNORMAL LOW (ref 26.0–34.0)
MCHC: 30.1 g/dL (ref 30.0–36.0)
MCV: 79.3 fL (ref 78.0–100.0)
PLATELETS: 283 10*3/uL (ref 150–400)
RBC: 4.35 MIL/uL (ref 4.22–5.81)
RDW: 18.4 % — AB (ref 11.5–15.5)
WBC: 5 10*3/uL (ref 4.0–10.5)

## 2017-09-03 NOTE — ED Triage Notes (Signed)
The pt  Had lab work drawn today and he was called and told his potassium and his wbcs were high and to come to the ed

## 2017-09-03 NOTE — Telephone Encounter (Signed)
-----   Message from Antony Blackbird, MD sent at 08/30/2017  5:10 PM EDT ----- Hgb A1c indicates a slight increased future risk of becoming diabetic

## 2017-09-03 NOTE — Telephone Encounter (Signed)
MA unable to leave a message due to ringing and no answer. MA reached out to patient with 3 attempts Patient verified DOB Patient states he has been feeling worse. Patient is aware of A1C also indicating and increase. MA informed patient of WBC and potassium level being elevated and needing to be evaluated ASAP. Patient states he will report there in the morning. MA advised patient of the importance and patient understood.

## 2017-09-04 LAB — I-STAT CG4 LACTIC ACID, ED: Lactic Acid, Venous: 0.73 mmol/L (ref 0.5–1.9)

## 2017-09-04 NOTE — ED Notes (Signed)
Pt states he is ready to go as he has to work in 2 hours. Reassured patient and he agrees to stay for blood draw but possibly not the results. EDP notified of pt's desire to leave.
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
## 2017-09-09 LAB — CULTURE, BLOOD (ROUTINE X 2)
Culture: NO GROWTH
Culture: NO GROWTH

## 2017-09-11 ENCOUNTER — Encounter (HOSPITAL_COMMUNITY): Payer: Self-pay | Admitting: Emergency Medicine

## 2017-09-11 ENCOUNTER — Emergency Department (HOSPITAL_COMMUNITY)
Admission: EM | Admit: 2017-09-11 | Discharge: 2017-09-12 | Disposition: A | Discharge status: Home / Self Care | Payer: Medicaid Other | Attending: Emergency Medicine | Admitting: Emergency Medicine

## 2017-09-11 ENCOUNTER — Emergency Department (HOSPITAL_COMMUNITY): Payer: Medicaid Other

## 2017-09-11 DIAGNOSIS — I509 Heart failure, unspecified: Secondary | ICD-10-CM | POA: Diagnosis not present

## 2017-09-11 DIAGNOSIS — J449 Chronic obstructive pulmonary disease, unspecified: Secondary | ICD-10-CM | POA: Insufficient documentation

## 2017-09-11 DIAGNOSIS — Z79899 Other long term (current) drug therapy: Secondary | ICD-10-CM | POA: Diagnosis not present

## 2017-09-11 DIAGNOSIS — E785 Hyperlipidemia, unspecified: Secondary | ICD-10-CM | POA: Diagnosis not present

## 2017-09-11 DIAGNOSIS — I110 Hypertensive heart disease with heart failure: Secondary | ICD-10-CM | POA: Diagnosis not present

## 2017-09-11 DIAGNOSIS — E119 Type 2 diabetes mellitus without complications: Secondary | ICD-10-CM | POA: Insufficient documentation

## 2017-09-11 DIAGNOSIS — T401X1A Poisoning by heroin, accidental (unintentional), initial encounter: Secondary | ICD-10-CM | POA: Insufficient documentation

## 2017-09-11 DIAGNOSIS — Z8673 Personal history of transient ischemic attack (TIA), and cerebral infarction without residual deficits: Secondary | ICD-10-CM | POA: Diagnosis not present

## 2017-09-11 DIAGNOSIS — F17210 Nicotine dependence, cigarettes, uncomplicated: Secondary | ICD-10-CM | POA: Diagnosis not present

## 2017-09-11 DIAGNOSIS — Z86718 Personal history of other venous thrombosis and embolism: Secondary | ICD-10-CM | POA: Insufficient documentation

## 2017-09-11 LAB — CBC WITH DIFFERENTIAL/PLATELET
ABS IMMATURE GRANULOCYTES: 0 10*3/uL (ref 0.0–0.1)
BASOS ABS: 0 10*3/uL (ref 0.0–0.1)
BASOS PCT: 1 %
Eosinophils Absolute: 0.1 10*3/uL (ref 0.0–0.7)
Eosinophils Relative: 2 %
HCT: 33.6 % — ABNORMAL LOW (ref 39.0–52.0)
Hemoglobin: 9.9 g/dL — ABNORMAL LOW (ref 13.0–17.0)
IMMATURE GRANULOCYTES: 0 %
Lymphocytes Relative: 15 %
Lymphs Abs: 0.9 10*3/uL (ref 0.7–4.0)
MCH: 23.8 pg — ABNORMAL LOW (ref 26.0–34.0)
MCHC: 29.5 g/dL — ABNORMAL LOW (ref 30.0–36.0)
MCV: 80.8 fL (ref 78.0–100.0)
MONOS PCT: 9 %
Monocytes Absolute: 0.5 10*3/uL (ref 0.1–1.0)
NEUTROS ABS: 4.5 10*3/uL (ref 1.7–7.7)
NEUTROS PCT: 73 %
PLATELETS: 248 10*3/uL (ref 150–400)
RBC: 4.16 MIL/uL — AB (ref 4.22–5.81)
RDW: 18.3 % — ABNORMAL HIGH (ref 11.5–15.5)
WBC: 6.1 10*3/uL (ref 4.0–10.5)

## 2017-09-11 LAB — COMPREHENSIVE METABOLIC PANEL
ALBUMIN: 3.4 g/dL — AB (ref 3.5–5.0)
ALT: 14 U/L (ref 0–44)
AST: 19 U/L (ref 15–41)
Alkaline Phosphatase: 70 U/L (ref 38–126)
Anion gap: 9 (ref 5–15)
BUN: 18 mg/dL (ref 6–20)
CHLORIDE: 98 mmol/L (ref 98–111)
CO2: 22 mmol/L (ref 22–32)
CREATININE: 0.84 mg/dL (ref 0.61–1.24)
Calcium: 8.3 mg/dL — ABNORMAL LOW (ref 8.9–10.3)
GFR calc Af Amer: >60 mL/min (ref >60)
GFR calc non Af Amer: >60 mL/min (ref >60)
Glucose, Bld: 147 mg/dL — ABNORMAL HIGH (ref 70–99)
Potassium: 4 mmol/L (ref 3.5–5.1)
SODIUM: 129 mmol/L — AB (ref 135–145)
Total Bilirubin: 0.9 mg/dL (ref 0.3–1.2)
Total Protein: 8 g/dL (ref 6.5–8.1)

## 2017-09-11 LAB — I-STAT TROPONIN, ED: Troponin i, poc: 0 ng/mL (ref 0.00–0.08)

## 2017-09-11 MED ORDER — SODIUM CHLORIDE 0.9 % IV BOLUS
1000.0000 mL | Freq: Once | INTRAVENOUS | Status: AC
  Administered 2017-09-11: 1000 mL via INTRAVENOUS

## 2017-09-11 NOTE — ED Triage Notes (Signed)
Per EMS, pt report heroin/fententil IV use today, felt different, became very anxious.  O2 stats low 88's w/ snoring respirations at time until stimulated.
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
## 2017-09-11 NOTE — ED Notes (Signed)
I Stat [place] Acid results of 4.94 reported to Quincy Carnes PA

## 2017-09-12 NOTE — ED Notes (Signed)
Pt states he needs to go home. Sleeping upon my entry to room, but alert & oriented x4 with minor stimulation.

## 2017-09-12 NOTE — ED Notes (Signed)
Reviewed d/c instructions with pt, who verbalized understanding and had no outstanding questions. Pt departed in NAD, escorted to front lobby in wheelchair, where he's waiting for his wife to come pick him up.

## 2017-09-12 NOTE — ED Provider Notes (Signed)
Care assumed from Dr. Sherry Ruffing at shift change.  Patient awaiting metabolism of heroin.  He apparently began taking heroin to manage his chronic pain after his primary care provider is no longer in practice.  He has been observed for many hours in the ER.  He is now awake, alert, and appropriate.  I feel as though he is appropriate for discharge.   [name], MD 09/12/17 [phone]

## 2017-09-12 NOTE — Discharge Instructions (Addendum)
Follow-up with your primary doctor for any new problems.

## 2017-09-12 NOTE — ED Notes (Signed)
Pt unable to urinate in urinal; requires constant stimulation to stay awake and follow commands

## 2017-09-27 ENCOUNTER — Emergency Department (HOSPITAL_COMMUNITY): Payer: Medicaid Other

## 2017-09-27 ENCOUNTER — Emergency Department (HOSPITAL_COMMUNITY)
Admission: EM | Admit: 2017-09-27 | Discharge: 2017-09-27 | Disposition: A | Discharge status: Home / Self Care | Payer: Medicaid Other | Attending: Emergency Medicine | Admitting: Emergency Medicine

## 2017-09-27 ENCOUNTER — Ambulatory Visit: Payer: Self-pay | Admitting: Family Medicine

## 2017-09-27 DIAGNOSIS — I110 Hypertensive heart disease with heart failure: Secondary | ICD-10-CM | POA: Diagnosis not present

## 2017-09-27 DIAGNOSIS — J449 Chronic obstructive pulmonary disease, unspecified: Secondary | ICD-10-CM | POA: Diagnosis not present

## 2017-09-27 DIAGNOSIS — F17210 Nicotine dependence, cigarettes, uncomplicated: Secondary | ICD-10-CM | POA: Diagnosis not present

## 2017-09-27 DIAGNOSIS — Z79899 Other long term (current) drug therapy: Secondary | ICD-10-CM | POA: Diagnosis not present

## 2017-09-27 DIAGNOSIS — I509 Heart failure, unspecified: Secondary | ICD-10-CM | POA: Insufficient documentation

## 2017-09-27 DIAGNOSIS — R531 Weakness: Secondary | ICD-10-CM | POA: Diagnosis not present

## 2017-09-27 DIAGNOSIS — E119 Type 2 diabetes mellitus without complications: Secondary | ICD-10-CM | POA: Diagnosis not present

## 2017-09-27 LAB — I-STAT TROPONIN, ED: TROPONIN I, POC: 0 ng/mL (ref 0.00–0.08)

## 2017-09-27 LAB — CBC WITH DIFFERENTIAL/PLATELET
BASOS PCT: 1 %
Basophils Absolute: 0.1 10*3/uL (ref 0.0–0.1)
EOS PCT: 1 %
Eosinophils Absolute: 0.1 10*3/uL (ref 0.0–0.7)
HEMATOCRIT: 35.9 % — AB (ref 39.0–52.0)
HEMOGLOBIN: 10.3 g/dL — AB (ref 13.0–17.0)
LYMPHS PCT: 29 %
Lymphs Abs: 1.8 10*3/uL (ref 0.7–4.0)
MCH: 22.6 pg — AB (ref 26.0–34.0)
MCHC: 28.7 g/dL — ABNORMAL LOW (ref 30.0–36.0)
MCV: 78.7 fL (ref 78.0–100.0)
MONOS PCT: 11 %
Monocytes Absolute: 0.7 10*3/uL (ref 0.1–1.0)
NEUTROS ABS: 3.6 10*3/uL (ref 1.7–7.7)
NEUTROS PCT: 58 %
Platelets: 341 10*3/uL (ref 150–400)
RBC: 4.56 MIL/uL (ref 4.22–5.81)
RDW: 18.6 % — ABNORMAL HIGH (ref 11.5–15.5)
WBC: 6.3 10*3/uL (ref 4.0–10.5)

## 2017-09-27 LAB — BASIC METABOLIC PANEL
ANION GAP: 10 (ref 5–15)
BUN: 19 mg/dL (ref 6–20)
CHLORIDE: 104 mmol/L (ref 98–111)
CO2: 24 mmol/L (ref 22–32)
Calcium: 8.8 mg/dL — ABNORMAL LOW (ref 8.9–10.3)
Creatinine, Ser: 0.84 mg/dL (ref 0.61–1.24)
GFR calc non Af Amer: >60 mL/min (ref >60)
GLUCOSE: 92 mg/dL (ref 70–99)
Potassium: 4.6 mmol/L (ref 3.5–5.1)
Sodium: 138 mmol/L (ref 135–145)

## 2017-09-27 MED ORDER — SODIUM CHLORIDE 0.9 % IV BOLUS
1000.0000 mL | Freq: Once | INTRAVENOUS | Status: AC
  Administered 2017-09-27: 1000 mL via INTRAVENOUS
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
## 2017-09-27 NOTE — ED Notes (Signed)
Wasn't able to get patient blood, the Nurse was informed.

## 2017-09-27 NOTE — Discharge Instructions (Addendum)
You were evaluated in the emergency department for feeling weak and dizzy this morning.  You had blood work EKG chest x-ray that did not show an obvious cause of your symptoms.  Please continue to stay well-hydrated and follow-up with your doctor.  Return if any worsening symptoms.

## 2017-09-27 NOTE — ED Notes (Addendum)
Pt stated that "I has been dizzy and I am so dizzy" so I did orthostatics

## 2017-09-27 NOTE — ED Notes (Signed)
Pt stated he can not Uniate at this his because he just went before he got here. I will try later.

## 2017-09-27 NOTE — ED Notes (Signed)
I was unable to get pt blood

## 2017-09-27 NOTE — ED Triage Notes (Signed)
Patient had just drank a cup of coffee and felt weak after walking.

## 2017-09-27 NOTE — ED Notes (Signed)
Patient verbalizes understanding of discharge instructions. Opportunity for questioning and answers were provided. Armband removed by staff, pt discharged from ED. Ambulated to lobby with cane.

## 2017-09-27 NOTE — ED Notes (Signed)
Patient transported to X-ray 

## 2017-10-08 ENCOUNTER — Emergency Department (HOSPITAL_COMMUNITY): Payer: Medicaid Other

## 2017-10-08 ENCOUNTER — Observation Stay (HOSPITAL_COMMUNITY): Payer: Medicaid Other

## 2017-10-08 ENCOUNTER — Inpatient Hospital Stay (HOSPITAL_COMMUNITY)
Admission: EM | Admit: 2017-10-08 | Discharge: 2017-10-10 | DRG: 065 | Disposition: A | Discharge status: Home / Self Care | Payer: Medicaid Other | Attending: Internal Medicine | Admitting: Internal Medicine

## 2017-10-08 ENCOUNTER — Encounter (HOSPITAL_COMMUNITY): Payer: Self-pay | Admitting: *Deleted

## 2017-10-08 DIAGNOSIS — Z86711 Personal history of pulmonary embolism: Secondary | ICD-10-CM

## 2017-10-08 DIAGNOSIS — R7989 Other specified abnormal findings of blood chemistry: Secondary | ICD-10-CM | POA: Diagnosis present

## 2017-10-08 DIAGNOSIS — Z96662 Presence of left artificial ankle joint: Secondary | ICD-10-CM | POA: Diagnosis present

## 2017-10-08 DIAGNOSIS — Z79891 Long term (current) use of opiate analgesic: Secondary | ICD-10-CM

## 2017-10-08 DIAGNOSIS — Z8249 Family history of ischemic heart disease and other diseases of the circulatory system: Secondary | ICD-10-CM

## 2017-10-08 DIAGNOSIS — I10 Essential (primary) hypertension: Secondary | ICD-10-CM | POA: Diagnosis present

## 2017-10-08 DIAGNOSIS — R29701 NIHSS score 1: Secondary | ICD-10-CM | POA: Diagnosis present

## 2017-10-08 DIAGNOSIS — I110 Hypertensive heart disease with heart failure: Secondary | ICD-10-CM | POA: Diagnosis present

## 2017-10-08 DIAGNOSIS — F1410 Cocaine abuse, uncomplicated: Secondary | ICD-10-CM | POA: Diagnosis present

## 2017-10-08 DIAGNOSIS — Z8673 Personal history of transient ischemic attack (TIA), and cerebral infarction without residual deficits: Secondary | ICD-10-CM | POA: Diagnosis not present

## 2017-10-08 DIAGNOSIS — F17210 Nicotine dependence, cigarettes, uncomplicated: Secondary | ICD-10-CM | POA: Diagnosis present

## 2017-10-08 DIAGNOSIS — J42 Unspecified chronic bronchitis: Secondary | ICD-10-CM | POA: Diagnosis not present

## 2017-10-08 DIAGNOSIS — Z95810 Presence of automatic (implantable) cardiac defibrillator: Secondary | ICD-10-CM

## 2017-10-08 DIAGNOSIS — F1110 Opioid abuse, uncomplicated: Secondary | ICD-10-CM | POA: Diagnosis present

## 2017-10-08 DIAGNOSIS — M79672 Pain in left foot: Secondary | ICD-10-CM | POA: Diagnosis present

## 2017-10-08 DIAGNOSIS — Z885 Allergy status to narcotic agent status: Secondary | ICD-10-CM

## 2017-10-08 DIAGNOSIS — Z8661 Personal history of infections of the central nervous system: Secondary | ICD-10-CM

## 2017-10-08 DIAGNOSIS — I509 Heart failure, unspecified: Secondary | ICD-10-CM | POA: Diagnosis present

## 2017-10-08 DIAGNOSIS — R29898 Other symptoms and signs involving the musculoskeletal system: Secondary | ICD-10-CM | POA: Diagnosis present

## 2017-10-08 DIAGNOSIS — E785 Hyperlipidemia, unspecified: Secondary | ICD-10-CM | POA: Diagnosis present

## 2017-10-08 DIAGNOSIS — G8191 Hemiplegia, unspecified affecting right dominant side: Secondary | ICD-10-CM | POA: Diagnosis present

## 2017-10-08 DIAGNOSIS — I639 Cerebral infarction, unspecified: Principal | ICD-10-CM | POA: Diagnosis present

## 2017-10-08 DIAGNOSIS — F1910 Other psychoactive substance abuse, uncomplicated: Secondary | ICD-10-CM | POA: Diagnosis present

## 2017-10-08 DIAGNOSIS — Z7902 Long term (current) use of antithrombotics/antiplatelets: Secondary | ICD-10-CM

## 2017-10-08 DIAGNOSIS — Z8614 Personal history of Methicillin resistant Staphylococcus aureus infection: Secondary | ICD-10-CM

## 2017-10-08 DIAGNOSIS — Z79899 Other long term (current) drug therapy: Secondary | ICD-10-CM

## 2017-10-08 DIAGNOSIS — J449 Chronic obstructive pulmonary disease, unspecified: Secondary | ICD-10-CM | POA: Diagnosis present

## 2017-10-08 DIAGNOSIS — E871 Hypo-osmolality and hyponatremia: Secondary | ICD-10-CM | POA: Diagnosis present

## 2017-10-08 DIAGNOSIS — R778 Other specified abnormalities of plasma proteins: Secondary | ICD-10-CM | POA: Diagnosis present

## 2017-10-08 DIAGNOSIS — G4733 Obstructive sleep apnea (adult) (pediatric): Secondary | ICD-10-CM | POA: Diagnosis present

## 2017-10-08 DIAGNOSIS — M069 Rheumatoid arthritis, unspecified: Secondary | ICD-10-CM | POA: Diagnosis present

## 2017-10-08 DIAGNOSIS — K219 Gastro-esophageal reflux disease without esophagitis: Secondary | ICD-10-CM | POA: Diagnosis present

## 2017-10-08 DIAGNOSIS — F329 Major depressive disorder, single episode, unspecified: Secondary | ICD-10-CM | POA: Diagnosis present

## 2017-10-08 DIAGNOSIS — Z91013 Allergy to seafood: Secondary | ICD-10-CM

## 2017-10-08 DIAGNOSIS — I700 Atherosclerosis of aorta: Secondary | ICD-10-CM | POA: Diagnosis present

## 2017-10-08 DIAGNOSIS — G894 Chronic pain syndrome: Secondary | ICD-10-CM | POA: Diagnosis present

## 2017-10-08 DIAGNOSIS — B1920 Unspecified viral hepatitis C without hepatic coma: Secondary | ICD-10-CM | POA: Diagnosis present

## 2017-10-08 DIAGNOSIS — R748 Abnormal levels of other serum enzymes: Secondary | ICD-10-CM

## 2017-10-08 DIAGNOSIS — I739 Peripheral vascular disease, unspecified: Secondary | ICD-10-CM

## 2017-10-08 DIAGNOSIS — Z87442 Personal history of urinary calculi: Secondary | ICD-10-CM

## 2017-10-08 DIAGNOSIS — D509 Iron deficiency anemia, unspecified: Secondary | ICD-10-CM | POA: Diagnosis present

## 2017-10-08 DIAGNOSIS — Z9119 Patient's noncompliance with other medical treatment and regimen: Secondary | ICD-10-CM

## 2017-10-08 DIAGNOSIS — Z888 Allergy status to other drugs, medicaments and biological substances status: Secondary | ICD-10-CM

## 2017-10-08 DIAGNOSIS — R531 Weakness: Secondary | ICD-10-CM

## 2017-10-08 DIAGNOSIS — E1151 Type 2 diabetes mellitus with diabetic peripheral angiopathy without gangrene: Secondary | ICD-10-CM | POA: Diagnosis present

## 2017-10-08 DIAGNOSIS — I6521 Occlusion and stenosis of right carotid artery: Secondary | ICD-10-CM | POA: Diagnosis present

## 2017-10-08 LAB — CBC
HCT: 32.7 % — ABNORMAL LOW (ref 39.0–52.0)
Hemoglobin: 9.6 g/dL — ABNORMAL LOW (ref 13.0–17.0)
MCH: 22.6 pg — AB (ref 26.0–34.0)
MCHC: 29.4 g/dL — AB (ref 30.0–36.0)
MCV: 76.9 fL — AB (ref 78.0–100.0)
Platelets: 285 10*3/uL (ref 150–400)
RBC: 4.25 MIL/uL (ref 4.22–5.81)
RDW: 18.6 % — ABNORMAL HIGH (ref 11.5–15.5)
WBC: 4.3 10*3/uL (ref 4.0–10.5)

## 2017-10-08 LAB — PROTIME-INR
INR: 1.17
PROTHROMBIN TIME: 14.8 s (ref 11.4–15.2)

## 2017-10-08 LAB — DIFFERENTIAL
Basophils Absolute: 0 10*3/uL (ref 0.0–0.1)
Basophils Relative: 1 %
EOS PCT: 1 %
Eosinophils Absolute: 0.1 10*3/uL (ref 0.0–0.7)
Lymphocytes Relative: 33 %
Lymphs Abs: 1.6 10*3/uL (ref 0.7–4.0)
MONO ABS: 0.7 10*3/uL (ref 0.1–1.0)
Monocytes Relative: 14 %
Neutro Abs: 2.5 10*3/uL (ref 1.7–7.7)
Neutrophils Relative %: 51 %

## 2017-10-08 LAB — RAPID URINE DRUG SCREEN, HOSP PERFORMED
Amphetamines: NOT DETECTED
Barbiturates: NOT DETECTED
Benzodiazepines: NOT DETECTED
COCAINE: NOT DETECTED
Opiates: POSITIVE — AB
TETRAHYDROCANNABINOL: NOT DETECTED

## 2017-10-08 LAB — HEPATIC FUNCTION PANEL
ALBUMIN: 2.7 g/dL — AB (ref 3.5–5.0)
ALK PHOS: 61 U/L (ref 38–126)
ALT: 22 U/L (ref 0–44)
AST: 48 U/L — AB (ref 15–41)
BILIRUBIN TOTAL: 0.5 mg/dL (ref 0.3–1.2)
Bilirubin, Direct: 0.2 mg/dL (ref 0.0–0.2)
Indirect Bilirubin: 0.3 mg/dL (ref 0.3–0.9)
Total Protein: 7.3 g/dL (ref 6.5–8.1)

## 2017-10-08 LAB — URINALYSIS, ROUTINE W REFLEX MICROSCOPIC
Bilirubin Urine: NEGATIVE
Glucose, UA: NEGATIVE mg/dL
Ketones, ur: NEGATIVE mg/dL
Leukocytes, UA: NEGATIVE
Nitrite: NEGATIVE
PROTEIN: 100 mg/dL — AB
Specific Gravity, Urine: 1.026 (ref 1.005–1.030)
pH: 5 (ref 5.0–8.0)

## 2017-10-08 LAB — BASIC METABOLIC PANEL
ANION GAP: 11 (ref 5–15)
BUN: 21 mg/dL — ABNORMAL HIGH (ref 6–20)
CALCIUM: 8.5 mg/dL — AB (ref 8.9–10.3)
CO2: 22 mmol/L (ref 22–32)
Chloride: 100 mmol/L (ref 98–111)
Creatinine, Ser: 1.07 mg/dL (ref 0.61–1.24)
GFR calc Af Amer: >60 mL/min (ref >60)
GFR calc non Af Amer: >60 mL/min (ref >60)
Glucose, Bld: 188 mg/dL — ABNORMAL HIGH (ref 70–99)
Potassium: 4.1 mmol/L (ref 3.5–5.1)
Sodium: 133 mmol/L — ABNORMAL LOW (ref 135–145)

## 2017-10-08 LAB — I-STAT TROPONIN, ED: Troponin i, poc: 1.48 ng/mL (ref 0.00–0.08)

## 2017-10-08 LAB — ETHANOL

## 2017-10-08 LAB — APTT: aPTT: 40 seconds — ABNORMAL HIGH (ref 24–36)

## 2017-10-08 MED ORDER — SODIUM CHLORIDE 0.9 % IV SOLN: INTRAVENOUS | Status: DC

## 2017-10-08 MED ORDER — ONDANSETRON HCL 4 MG/2ML IJ SOLN: 4.0000 mg | Freq: Four times a day (QID) | INTRAMUSCULAR | Status: DC | PRN

## 2017-10-08 MED ORDER — STROKE: EARLY STAGES OF RECOVERY BOOK
Freq: Once | Status: AC
  Administered 2017-10-09: 19:00:00
  Filled 2017-10-08: qty 1

## 2017-10-08 MED ORDER — SENNOSIDES-DOCUSATE SODIUM 8.6-50 MG PO TABS: 1.0000 | ORAL_TABLET | Freq: Every evening | ORAL | Status: DC | PRN

## 2017-10-08 MED ORDER — ACETAMINOPHEN 650 MG RE SUPP: 650.0000 mg | Freq: Four times a day (QID) | RECTAL | Status: DC | PRN

## 2017-10-08 MED ORDER — FAMOTIDINE 20 MG PO TABS: 20.0000 mg | ORAL_TABLET | Freq: Two times a day (BID) | ORAL | Status: DC | PRN

## 2017-10-08 MED ORDER — PANTOPRAZOLE SODIUM 20 MG PO TBEC
20.0000 mg | DELAYED_RELEASE_TABLET | Freq: Every day | ORAL | Status: DC
  Administered 2017-10-09 – 2017-10-10 (×2): 20 mg via ORAL
  Filled 2017-10-08 (×2): qty 1

## 2017-10-08 MED ORDER — ALBUTEROL SULFATE (2.5 MG/3ML) 0.083% IN NEBU: 2.5000 mg | INHALATION_SOLUTION | Freq: Four times a day (QID) | RESPIRATORY_TRACT | Status: DC | PRN

## 2017-10-08 MED ORDER — HYDROCODONE-ACETAMINOPHEN 5-325 MG PO TABS
1.0000 | ORAL_TABLET | ORAL | Status: DC | PRN
  Administered 2017-10-09: 2 via ORAL
  Filled 2017-10-08: qty 2

## 2017-10-08 MED ORDER — ACETAMINOPHEN 325 MG PO TABS: 650.0000 mg | ORAL_TABLET | Freq: Four times a day (QID) | ORAL | Status: DC | PRN

## 2017-10-08 MED ORDER — ONDANSETRON HCL 4 MG PO TABS: 4.0000 mg | ORAL_TABLET | Freq: Four times a day (QID) | ORAL | Status: DC | PRN

## 2017-10-08 MED ORDER — HYDROXYZINE PAMOATE 25 MG PO CAPS: 25.0000 mg | ORAL_CAPSULE | Freq: Two times a day (BID) | ORAL | Status: DC

## 2017-10-08 MED ORDER — ASPIRIN 300 MG RE SUPP: 300.0000 mg | Freq: Every day | RECTAL | Status: DC

## 2017-10-08 MED ORDER — ASPIRIN 325 MG PO TABS
325.0000 mg | ORAL_TABLET | Freq: Every day | ORAL | Status: DC
  Administered 2017-10-09 – 2017-10-10 (×2): 325 mg via ORAL
  Filled 2017-10-08 (×2): qty 1

## 2017-10-08 MED ORDER — LORAZEPAM 2 MG/ML IJ SOLN
1.0000 mg | Freq: Once | INTRAMUSCULAR | Status: AC
  Administered 2017-10-08: 1 mg via INTRAVENOUS
  Filled 2017-10-08: qty 1

## 2017-10-08 MED ORDER — CLOPIDOGREL BISULFATE 75 MG PO TABS
75.0000 mg | ORAL_TABLET | Freq: Every day | ORAL | Status: DC
  Administered 2017-10-09 – 2017-10-10 (×2): 75 mg via ORAL
  Filled 2017-10-08 (×2): qty 1

## 2017-10-08 MED ORDER — ENOXAPARIN SODIUM 40 MG/0.4ML ~~LOC~~ SOLN
40.0000 mg | Freq: Every day | SUBCUTANEOUS | Status: DC
  Administered 2017-10-09: 40 mg via SUBCUTANEOUS
  Filled 2017-10-08: qty 0.4

## 2017-10-08 MED ORDER — SODIUM CHLORIDE 0.9% FLUSH
3.0000 mL | Freq: Two times a day (BID) | INTRAVENOUS | Status: DC
  Administered 2017-10-09: 3 mL via INTRAVENOUS

## 2017-10-08 NOTE — ED Notes (Signed)
Pt brought back to room from MRI. Pt found to have HR of 150. EDP notified, admitting paged. Obtaining EKG

## 2017-10-08 NOTE — ED Notes (Signed)
Admitting at bedside 

## 2017-10-08 NOTE — ED Notes (Signed)
Patient transported to MRI 
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
## 2017-10-08 NOTE — ED Triage Notes (Signed)
Pt in via EMS to triage c/o generalized fatigue and weakness, also body aches, VSS, CBG 224, no distress on arrival

## 2017-10-08 NOTE — ED Notes (Signed)
Patient transported to CT 

## 2017-10-08 NOTE — ED Notes (Signed)
MD Opyd performed a carotid massage at bedside. Pt's HR now 84, obtaining new EKG.

## 2017-10-08 NOTE — ED Notes (Signed)
Pt given sandwich and coke, OK per MD Alvino Chapel

## 2017-10-09 ENCOUNTER — Observation Stay (HOSPITAL_COMMUNITY): Payer: Medicaid Other

## 2017-10-09 ENCOUNTER — Observation Stay (HOSPITAL_BASED_OUTPATIENT_CLINIC_OR_DEPARTMENT_OTHER): Payer: Medicaid Other

## 2017-10-09 ENCOUNTER — Encounter (HOSPITAL_COMMUNITY): Payer: Self-pay | Admitting: Radiology

## 2017-10-09 DIAGNOSIS — I639 Cerebral infarction, unspecified: Secondary | ICD-10-CM | POA: Diagnosis present

## 2017-10-09 DIAGNOSIS — M069 Rheumatoid arthritis, unspecified: Secondary | ICD-10-CM | POA: Diagnosis present

## 2017-10-09 DIAGNOSIS — R29701 NIHSS score 1: Secondary | ICD-10-CM | POA: Diagnosis present

## 2017-10-09 DIAGNOSIS — R748 Abnormal levels of other serum enzymes: Secondary | ICD-10-CM | POA: Diagnosis not present

## 2017-10-09 DIAGNOSIS — Z8673 Personal history of transient ischemic attack (TIA), and cerebral infarction without residual deficits: Secondary | ICD-10-CM | POA: Diagnosis not present

## 2017-10-09 DIAGNOSIS — F1410 Cocaine abuse, uncomplicated: Secondary | ICD-10-CM | POA: Diagnosis present

## 2017-10-09 DIAGNOSIS — B1920 Unspecified viral hepatitis C without hepatic coma: Secondary | ICD-10-CM | POA: Diagnosis present

## 2017-10-09 DIAGNOSIS — F1910 Other psychoactive substance abuse, uncomplicated: Secondary | ICD-10-CM | POA: Insufficient documentation

## 2017-10-09 DIAGNOSIS — I10 Essential (primary) hypertension: Secondary | ICD-10-CM | POA: Diagnosis not present

## 2017-10-09 DIAGNOSIS — K219 Gastro-esophageal reflux disease without esophagitis: Secondary | ICD-10-CM | POA: Diagnosis present

## 2017-10-09 DIAGNOSIS — F329 Major depressive disorder, single episode, unspecified: Secondary | ICD-10-CM | POA: Diagnosis present

## 2017-10-09 DIAGNOSIS — I5030 Unspecified diastolic (congestive) heart failure: Secondary | ICD-10-CM

## 2017-10-09 DIAGNOSIS — I700 Atherosclerosis of aorta: Secondary | ICD-10-CM | POA: Diagnosis present

## 2017-10-09 DIAGNOSIS — M79672 Pain in left foot: Secondary | ICD-10-CM | POA: Diagnosis present

## 2017-10-09 DIAGNOSIS — I110 Hypertensive heart disease with heart failure: Secondary | ICD-10-CM | POA: Diagnosis present

## 2017-10-09 DIAGNOSIS — F17210 Nicotine dependence, cigarettes, uncomplicated: Secondary | ICD-10-CM | POA: Diagnosis present

## 2017-10-09 DIAGNOSIS — I6521 Occlusion and stenosis of right carotid artery: Secondary | ICD-10-CM | POA: Diagnosis present

## 2017-10-09 DIAGNOSIS — Z96662 Presence of left artificial ankle joint: Secondary | ICD-10-CM | POA: Diagnosis present

## 2017-10-09 DIAGNOSIS — Z95810 Presence of automatic (implantable) cardiac defibrillator: Secondary | ICD-10-CM | POA: Diagnosis not present

## 2017-10-09 DIAGNOSIS — E871 Hypo-osmolality and hyponatremia: Secondary | ICD-10-CM | POA: Diagnosis present

## 2017-10-09 DIAGNOSIS — G8191 Hemiplegia, unspecified affecting right dominant side: Secondary | ICD-10-CM | POA: Diagnosis present

## 2017-10-09 DIAGNOSIS — I509 Heart failure, unspecified: Secondary | ICD-10-CM | POA: Diagnosis present

## 2017-10-09 DIAGNOSIS — F1110 Opioid abuse, uncomplicated: Secondary | ICD-10-CM | POA: Diagnosis present

## 2017-10-09 DIAGNOSIS — G4733 Obstructive sleep apnea (adult) (pediatric): Secondary | ICD-10-CM | POA: Diagnosis present

## 2017-10-09 DIAGNOSIS — G894 Chronic pain syndrome: Secondary | ICD-10-CM | POA: Diagnosis present

## 2017-10-09 DIAGNOSIS — E785 Hyperlipidemia, unspecified: Secondary | ICD-10-CM | POA: Diagnosis present

## 2017-10-09 DIAGNOSIS — J449 Chronic obstructive pulmonary disease, unspecified: Secondary | ICD-10-CM | POA: Diagnosis present

## 2017-10-09 DIAGNOSIS — J42 Unspecified chronic bronchitis: Secondary | ICD-10-CM | POA: Diagnosis not present

## 2017-10-09 DIAGNOSIS — E1151 Type 2 diabetes mellitus with diabetic peripheral angiopathy without gangrene: Secondary | ICD-10-CM | POA: Diagnosis present

## 2017-10-09 LAB — BASIC METABOLIC PANEL
ANION GAP: 8 (ref 5–15)
BUN: 18 mg/dL (ref 6–20)
CHLORIDE: 101 mmol/L (ref 98–111)
CO2: 24 mmol/L (ref 22–32)
Calcium: 8.1 mg/dL — ABNORMAL LOW (ref 8.9–10.3)
Creatinine, Ser: 0.76 mg/dL (ref 0.61–1.24)
Glucose, Bld: 107 mg/dL — ABNORMAL HIGH (ref 70–99)
POTASSIUM: 3.5 mmol/L (ref 3.5–5.1)
SODIUM: 133 mmol/L — AB (ref 135–145)

## 2017-10-09 LAB — CBC
HEMATOCRIT: 32.4 % — AB (ref 39.0–52.0)
HEMOGLOBIN: 9.8 g/dL — AB (ref 13.0–17.0)
MCH: 22.6 pg — ABNORMAL LOW (ref 26.0–34.0)
MCHC: 30.2 g/dL (ref 30.0–36.0)
MCV: 74.8 fL — AB (ref 78.0–100.0)
Platelets: 272 10*3/uL (ref 150–400)
RBC: 4.33 MIL/uL (ref 4.22–5.81)
RDW: 18.4 % — ABNORMAL HIGH (ref 11.5–15.5)
WBC: 4.8 10*3/uL (ref 4.0–10.5)

## 2017-10-09 LAB — LIPID PANEL
CHOL/HDL RATIO: 9.5 ratio
Cholesterol: 114 mg/dL (ref 0–200)
HDL: 12 mg/dL — AB (ref >40)
LDL Cholesterol: 55 mg/dL (ref 0–99)
TRIGLYCERIDES: 236 mg/dL — AB (ref <150)
VLDL: 47 mg/dL — ABNORMAL HIGH (ref 0–40)

## 2017-10-09 LAB — ECHOCARDIOGRAM COMPLETE

## 2017-10-09 LAB — TROPONIN I
TROPONIN I: 1.15 ng/mL — AB (ref <0.03)
Troponin I: 1.35 ng/mL (ref <0.03)

## 2017-10-09 LAB — HEMOGLOBIN A1C
Hgb A1c MFr Bld: 6.5 % — ABNORMAL HIGH (ref 4.8–5.6)
MEAN PLASMA GLUCOSE: 139.85 mg/dL

## 2017-10-09 LAB — D-DIMER, QUANTITATIVE: D-Dimer, Quant: 1.62 ug/mL-FEU — ABNORMAL HIGH (ref 0.00–0.50)

## 2017-10-09 MED ORDER — OXYCODONE-ACETAMINOPHEN 5-325 MG PO TABS
1.0000 | ORAL_TABLET | Freq: Four times a day (QID) | ORAL | Status: DC | PRN
  Administered 2017-10-09 – 2017-10-10 (×3): 2 via ORAL
  Filled 2017-10-09 (×3): qty 2

## 2017-10-09 MED ORDER — SODIUM CHLORIDE 0.9 % IV SOLN
INTRAVENOUS | Status: DC
  Administered 2017-10-09: 16:00:00 via INTRAVENOUS

## 2017-10-09 MED ORDER — ALBUTEROL SULFATE (2.5 MG/3ML) 0.083% IN NEBU: 2.5000 mg | INHALATION_SOLUTION | RESPIRATORY_TRACT | Status: DC | PRN

## 2017-10-09 MED ORDER — IOPAMIDOL (ISOVUE-370) INJECTION 76%
50.0000 mL | Freq: Once | INTRAVENOUS | Status: AC | PRN
  Administered 2017-10-09: 50 mL via INTRAVENOUS

## 2017-10-09 MED ORDER — ATORVASTATIN CALCIUM 80 MG PO TABS
80.0000 mg | ORAL_TABLET | Freq: Every day | ORAL | Status: DC
  Administered 2017-10-09: 80 mg via ORAL
  Filled 2017-10-09: qty 1

## 2017-10-09 MED ORDER — NICOTINE 21 MG/24HR TD PT24
21.0000 mg | MEDICATED_PATCH | Freq: Every day | TRANSDERMAL | Status: DC
  Administered 2017-10-10: 21 mg via TRANSDERMAL
  Filled 2017-10-09: qty 1

## 2017-10-09 MED ORDER — NICOTINE 21 MG/24HR TD PT24
21.0000 mg | MEDICATED_PATCH | Freq: Once | TRANSDERMAL | Status: DC
  Administered 2017-10-09: 21 mg via TRANSDERMAL
  Filled 2017-10-09: qty 1

## 2017-10-09 MED ORDER — IOPAMIDOL (ISOVUE-370) INJECTION 76%
INTRAVENOUS | Status: AC
  Administered 2017-10-09: 50 mL
  Filled 2017-10-09: qty 50

## 2017-10-09 MED ORDER — SODIUM CHLORIDE 0.9 % IV BOLUS
500.0000 mL | Freq: Once | INTRAVENOUS | Status: AC
  Administered 2017-10-09: 500 mL via INTRAVENOUS

## 2017-10-09 NOTE — Evaluation (Signed)
Physical Therapy Evaluation Patient Details Name: [name] MRN: [id number] DOB: 08-27-1960 Today's Date: 10/09/2017   History of Present Illness  Pt is a 57 y.o. M with significant PMH of injection drug use heroin, chronic pain, CHF, history of prior brain abscess, diabetes, hepatitis C, hypertension, hyperlipidemia, pulmonary embolism, peripheral vascular disease, strokes who presents for evaluation of generalized weakness. MRI showing embolic-looking strokes in left cerebral hemisphere and chronically occluded left ICA  Clinical Impression  Pt admitted with above diagnosis. Pt currently with functional limitations due to the deficits listed below (see PT Problem List). Prior to admission, patient has been homeless and ambulates with a cane. Patient presenting with decreased functional mobility secondary to balance impairments and decreased activity tolerance. Ambulating in room with cane and min guard assistance. No strength deficits noted. Pt will benefit from skilled PT to increase their independence and safety with mobility to allow discharge to the venue listed below.       Follow Up Recommendations Home health PT;Supervision for mobility/OOB (pt is homeless; may need to max acute PT services)    Equipment Recommendations  None recommended by PT    Recommendations for Other Services   Social Work Consult    Precautions / Restrictions Precautions Precautions: Fall Restrictions Weight Bearing Restrictions: No      Mobility  Bed Mobility Overal bed mobility: Modified Independent                Transfers Overall transfer level: Needs assistance Equipment used: Straight cane Transfers: Sit to/from Stand Sit to Stand: Min guard;Min assist         General transfer comment: Initially required min assist to steady from edge of bed but able to progress to min guard with transfer from toilet   Ambulation/Gait Ambulation/Gait assistance: Min guard Gait Distance  (Feet): 30 Feet Assistive device: Straight cane Gait Pattern/deviations: Step-through pattern;Trunk flexed;Decreased stride length Gait velocity: decr   General Gait Details: Patient with mildly unsteady gait using cane, requiring min guard assist and safety cues for line management  Stairs            Wheelchair Mobility    Modified Rankin (Stroke Patients Only) Modified Rankin (Stroke Patients Only) Pre-Morbid Rankin Score: No significant disability Modified Rankin: Moderately severe disability     Balance Overall balance assessment: Mild deficits observed, not formally tested                                           Pertinent Vitals/Pain Pain Assessment: Faces Faces Pain Scale: Hurts little more Pain Location: unspecified pain in right leg Pain Descriptors / Indicators: Discomfort Pain Intervention(s): Monitored during session    Home Living Family/patient expects to be discharged to:: Shelter/Homeless                 Additional Comments: Patient [place] the past 2 months    Prior Function Level of Independence: Needs assistance   Gait / Transfers Assistance Needed: uses cane for mobility           Hand Dominance   Dominant Hand: Right    Extremity/Trunk Assessment   Upper Extremity Assessment Upper Extremity Assessment: RUE deficits/detail;LUE deficits/detail RUE Deficits / Details: 5/5. AROM shoulder flexion limited to ~100 degrees LUE Deficits / Details: 5/5. AROM shoulder flexion limited to ~100 degrees    Lower  Extremity Assessment Lower Extremity Assessment: RLE deficits/detail;LLE deficits/detail RLE Deficits / Details: 5/5 LLE Deficits / Details: 5/5    Cervical / Trunk Assessment Cervical / Trunk Assessment: Kyphotic  Communication   Communication: No difficulties  Cognition Arousal/Alertness: Awake/alert Behavior During Therapy: Restless Overall Cognitive Status: No  family/caregiver present to determine baseline cognitive functioning                                 General Comments: Poor eye contact, follows multi step commands      General Comments General comments (skin integrity, edema, etc.): HR 70-90s bpm    Exercises     Assessment/Plan    PT Assessment Patient needs continued PT services  PT Problem List Decreased activity tolerance;Decreased mobility;Decreased balance;Decreased safety awareness       PT Treatment Interventions DME instruction;Stair training;Gait training;Functional mobility training;Therapeutic activities;Therapeutic exercise;Balance training;Patient/family education    PT Goals (Current goals can be found in the Care Plan section)  Acute Rehab PT Goals Patient Stated Goal: none stated; agreeable to participate in evaluation PT Goal Formulation: With patient Time For Goal Achievement: 10/23/17 Potential to Achieve Goals: Fair    Frequency Min 4X/week   Barriers to discharge Other (comment)(homeless)      Co-evaluation               AM-PAC PT "6 Clicks" Daily Activity  Outcome Measure Difficulty turning over in bed (including adjusting bedclothes, sheets and blankets)?: None Difficulty moving from lying on back to sitting on the side of the bed? : None Difficulty sitting down on and standing up from a chair with arms (e.g., wheelchair, bedside commode, etc,.)?: A Little Help needed moving to and from a bed to chair (including a wheelchair)?: A Little Help needed walking in hospital room?: A Little Help needed climbing 3-5 steps with a railing? : A Little 6 Click Score: 20    End of Session Equipment Utilized During Treatment: Gait belt Activity Tolerance: Patient tolerated treatment well Patient left: in bed;with call bell/phone within reach;with bed alarm set Nurse Communication: Mobility status PT Visit Diagnosis: Unsteadiness on feet (R26.81);Difficulty in walking, not elsewhere  classified (R26.2)    Time: 1540-1600 PT Time Calculation (min) (ACUTE ONLY): 20 min   Charges:   PT Evaluation $PT Eval Moderate Complexity: 1 Mod        Ellamae Sia, [place], DPT Acute Rehabilitation Services Pager [phone] Office [phone]   Willy Eddy 10/09/2017, 4:30 PM

## 2017-10-09 NOTE — ED Notes (Signed)
Neurology at bedside.

## 2017-10-09 NOTE — Progress Notes (Signed)
  Echocardiogram 2D Echocardiogram has been performed.  [name] 10/09/2017, 12:40 PM

## 2017-10-09 NOTE — Progress Notes (Signed)
STROKE TEAM PROGRESS NOTE   HISTORY OF PRESENT ILLNESS (per record) [name] is a 57 y.o. male past medical history of injection drug use heroin, chronic pain, CHF, known carotid stenosis with arteriogram from 2013 with 70% right ICA stenosis with pseudoaneurysm and occluded left ICA, history of prior brain abscess, diabetes, hepatitis C, hypertension, hyperlipidemia, pulmonary embolism, peripheral vascular disease, sleep apnea, strokes in the past with none to minimal residual weakness-noncompliant to antiplatelets, presents for evaluation of generalized fatigue and weakness.  His last known normal was sometime on Thursday, and brought to the emergency room at 1:30 PM on Friday, 10/08/2017 with complaints of generalized weakness.  On further history taking, he told the ER providers that he has had some right-sided weakness since Thursday but that has resolved. Given his history of multiple risk factors and prior strokes, an MRI of the brain was obtained that showed embolic-looking strokes in the left cerebral hemisphere.  Also the MRI showed the chronically occluded left ICA. He has been admitted to medicine for the stroke work-up.  Neurological consultation has been obtained for assistance on management/work-up for strokes. Patient had been given Ativan as he is claustrophobic, prior to his MRI which was just recently done.  He was drowsy and did not really provide reliable history as he kept falling asleep during majority of the encounter.   LKW: At least 24 hours ago or more tpa given?: no, outside the window Premorbid modified Rankin scale (mRS): 1   SUBJECTIVE (INTERVAL HISTORY) His family is not  at the bedside.   Hie states that he is doing fine and wants to go home.   OBJECTIVE Vitals:   10/09/17 0630 10/09/17 0645 10/09/17 0700 10/09/17 1148  BP: 125/71 138/71 123/67 138/76  Pulse: 76 75 71 85  Resp: 20 18 19 18   Temp:      TempSrc:      SpO2: 98% 100% 100% 100%    CBC:   Recent Labs  Lab 10/08/17 1335 10/08/17 1850 10/09/17 0340  WBC 4.3  --  4.8  NEUTROABS  --  2.5  --   HGB 9.6*  --  9.8*  HCT 32.7*  --  32.4*  MCV 76.9*  --  74.8*  PLT 285  --  846    Basic Metabolic Panel:  Recent Labs  Lab 10/08/17 1335 10/09/17 0340  NA 133* 133*  K 4.1 3.5  CL 100 101  CO2 22 24  GLUCOSE 188* 107*  BUN 21* 18  CREATININE 1.07 0.76  CALCIUM 8.5* 8.1*    Lipid Panel:     Component Value Date/Time   CHOL 114 10/09/2017 0340   TRIG 236 (H) 10/09/2017 0340   HDL 12 (L) 10/09/2017 0340   CHOLHDL 9.5 10/09/2017 0340   VLDL 47 (H) 10/09/2017 0340   LDLCALC 55 10/09/2017 0340   HgbA1c:  Lab Results  Component Value Date   HGBA1C 5.8 08/30/2017   Urine Drug Screen:     Component Value Date/Time   LABOPIA POSITIVE (A) 10/08/2017 1935   COCAINSCRNUR NONE DETECTED 10/08/2017 1935   COCAINSCRNUR NEG 04/12/2014 1136   LABBENZ NONE DETECTED 10/08/2017 1935   LABBENZ NEG 02/18/2009 2050   AMPHETMU NONE DETECTED 10/08/2017 1935   THCU NONE DETECTED 10/08/2017 1935   LABBARB NONE DETECTED 10/08/2017 1935    Alcohol Level     Component Value Date/Time   ETH <10 10/08/2017 1850    IMAGING  Ct Angio Head W Or Wo Contrast  Ct Angio Neck W Or Wo Contrast 10/09/2017 IMPRESSION:   CTA NECK:  1. Chronically occluded LEFT ICA. No hemodynamically significant stenosis RIGHT ICA.  2. Luminal irregularity LEFT vertebral artery most compatible with old dissection without flow-limiting stenosis.  3. Luminal irregularity RIGHT vertebral artery seen with old dissection or atherosclerosis. Severe stenosis RIGHT vertebral artery origin.  4. Suspected RIGHT subclavian artery non flow-limiting dissection flap. No pseudoaneurysm.   CTA HEAD:  1. No emergent large vessel occlusion.  Patent cerebral arteries.  2. Occluded LEFT ICA, reconstitution at carotid terminus.  3. Severe stenosis versus focally occluded RIGHT petrous ICA, severe stenosis RIGHT carotid  siphon.  4. Severe stenosis LEFT V4 segment.  Emphysema (ICD10-J43.9).   Aortic Atherosclerosis (ICD10-I70.0).    Dg Chest 2 View 10/08/2017 IMPRESSION:  1. No acute cardiopulmonary disease.  2. 9 mm nodular opacity in the right lower lobe likely reflecting a nipple shadow. Recommend repeat chest x-ray with nipple markers.    Ct Head Wo Contrast 10/08/2017 IMPRESSION:  No acute findings.  Multiple infarcts as before.    Ct Angio Chest Pe W Or Wo Contrast 10/09/2017 IMPRESSION:  1. No demonstrable pulmonary embolus. No thoracic aortic aneurysm or dissection. There is aortic atherosclerosis. There are foci of great vessel and coronary artery calcification.  2. Slightly prominent right hilar lymph nodes of uncertain etiology. Scattered subcentimeter lymph nodes elsewhere present.  3. Areas of atelectatic change and mild lower lobe bronchiectatic change. Probable rounded atelectasis right base posteriorly. This area measures 1.0 x 0.9 cm, stable, and does have a somewhat nodular appearance. Given this appearance, a noncontrast enhanced CT in 6 months to assess for stability may be reasonable.  4.  Stable adrenal hypertrophy.  5. Multiple prior fractures. No acute appearing fracture. No blastic or lytic bone lesions evident.  Aortic Atherosclerosis (ICD10-I70.0).    Mr Brain Wo Contrast 10/08/2017 IMPRESSION:  1. Acute small LEFT frontoparietal/MCA territory nonhemorrhagic infarcts.  2. Old LEFT MCA and posterior border zone territory infarcts. Old basal ganglia and thalami infarcts.  3. Bifrontal and RIGHT basal ganglia encephalomalacia at site of prior brain abscess.  4. Mild parenchymal brain volume loss.  5. Chronically occluded LEFT ICA.     Transthoracic Echocardiogram - pending 00/00/00      PHYSICAL EXAM Blood pressure 138/76, pulse 85, temperature 98.1 F (36.7 C), temperature source Oral, resp. rate 18, SpO2 100 %.  Frail unkempt cachectic middle-aged Caucasian  male not in distress. . Afebrile. Head is nontraumatic. Neck is supple without bruit.    Cardiac exam no murmur or gallop. Lungs are clear to auscultation. Distal pulses are well felt. Neurological Exam ;  Awake  Alert oriented x 3. Normal speech and language.eye movements full without nystagmus.fundi were not visualized. Vision acuity and fields appear normal. Hearing is normal. Palatal movements are normal. Face symmetric. Tongue midline. Normal strength, tone, reflexes and coordination. Normal sensation. Gait deferred.      ASSESSMENT/PLAN [name] is a 57 y.o. male with history of previous stroke, obstructive sleep apnea, implantable defibrillator, peripheral vascular disease - Dr Donnetta Hutching, previous pulmonary embolus, IV drug abuse, prior brain abscess, panic attacks, hypertension, hyperlipidemia, hepatitis C, diabetes, COPD, congestive heart failure, carotid stenosis with previous stenting performed by Dr. [name], chronic pain, depression, and seizure disorder presenting with right-sided weakness. He did not receive IV t-PA due to late presentation.  Stroke: Left MCA infarcts - embolic - etiology unclear.  Resultant  Mild dysarthria which has improved  CT head -  no acute findings.  Multiple previous infarcts.  MRI head - Acute small LEFT frontoparietal/MCA territory nonhemorrhagic infarcts. Multiple remote infarcts.  MRA head - not performed  CTA H&N - occluded Lt ICA with severe stenosis Rt ICA, Rt carotid siphon, Lt V4 segment, Rt VA origin.  Carotid Doppler - CTA neck performed - carotid dopplers not indicated.  2D Echo - pending  LDL - 55  HgbA1c - pending  VTE prophylaxis - Lovenox  Diet -  - Heart healthy with thin liquids.  clopidogrel 75 mg daily prior to admission, now on aspirin 325 mg daily  Patient counseled to be compliant with his antithrombotic medications  Ongoing aggressive stroke risk factor management  Therapy recommendations:   pending  Disposition:  Pending  Hypertension  Stable . Permissive hypertension (OK if < 220/120) but gradually normalize in 5-7 days . Long-term BP goal normotensive  Hyperlipidemia  Lipid lowering medication PTA:  none  LDL 55, goal < 70  Current lipid lowering medication: Lipitor 80 mg daily  Continue statin at discharge  Diabetes  HgbA1c pending, goal < 7.0  Unc / Controlled  Other Stroke Risk Factors  Cigarette smoker - advised to stop smoking   , There is no height or weight on file to calculate BMI., recommend weight loss, diet and exercise as appropriate   Hx stroke/TIA  History of substance abuse  Obstructive sleep apnea   Other Active Problems  Anemia  CTA Chest - nodular area right lung base -may need follow-up.      Hospital day # 0  I have personally examined this patient, reviewed notes, independently viewed imaging studies, participated in medical decision making and plan of care.ROS completed by me personally and pertinent positives fully documented  I have made any additions or clarifications directly to the above note. Agree with note above he has presented with transient dysarthria and altered mental status which has improved. MRI shows tiny punctate left frontoparietal infarcts. He has known chronic carotid occlusion and exact etiology of these infarcts is indeterminate. He may have had failure of collaterals due to relative hypotension versus active endocarditis given his history of drug abuse. Recommend check echocardiogram and blood cultures. Antiplatelet therapy with aspirin and Plavix for 3 weeks followed by aspirin alone. Patient advised to quit smoking cigarettes and doing drugs. Discuss with Dr. Thereasa Solo. Greater than 50% time during this 35 minute visit was spent on counseling and coordination of care over his strokes and discussion of evaluation,prevention and treatment plan and answering questions  Antony Contras, MD Medical  Director [facility] Pager: [phone] 10/09/2017 1:35 PM   To contact Stroke Continuity provider, please refer to [email or web address] After hours, contact General Neurology
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
## 2017-10-09 NOTE — Progress Notes (Addendum)
Patient arrived to room from ER at this time.VSS. TELE applied and confirmed. Orders and safety precautions reviewed with patient at this time. PT completed his late lunch tray then became agitated with RN (tech and another RN, Naval architect at bedside) about fall preventions. We explained to him the importance of safety due to his condition.Pt stated that no one explained to him "what the hell is going on to me" down in ED. RN reviewed his POC and stroke education, Pt requests to leave AMA. Security called to escort pt out if needed. MD paged and notified.    RN continue to reinforce safety precautions. After awhile sitting on the side of the bed, pt made a decision to stay and follow hospital protocol for fall prevention at this time. Pt agreed to use call bell to call for assist with OOB.Marland Kitchen Hourly roudning increase between RN and NT. No other distress or issue voice. Will continue to monitor.   Ave Filter, RN

## 2017-10-09 NOTE — Progress Notes (Signed)
[facility] TEAM 1 - Stepdown/ICU TEAM  [name]  VCB:[id number] DOB: 1960-05-27 DOA: 10/08/2017 PCP: Antony Blackbird, MD    Brief Narrative:  57 y.o. male w/ a hx of IV drug abuse, alcohol abuse in remission, CVA, chronic microcytic anemia, and COPD who presented to the ED w/ right-sided weakness yesterday that spontaneously resolved.  He admitted to ongoing IV heroin use.  In the ED CT head was negative for acute finding, but notable for multiple old infarcts.  Significant Events: 9/27 admit   Subjective: Pt is resting comfortably and reports that he feels he is back to normal. He denies cp, n/v, abdom pain, or sob.  He denies focal weakness at this time, or visual change.    Assessment & Plan:  Acute L frontoparietal CVA - multiple small L hemisphere CVAs of various age Neurology following - full workup underway - concern is for possible endocarditis as source of eboli - also has known severe arterial disease - awaiting TTE - CTa has noted multiple foci of vascular disease, dissection, and stenosis   IVD abuse  Need to rule out infective endocarditis as a source for cerebral embolization  Hx of brain abscess   PVD known 70% R carotid stenosis and occluded L ICA per arteriogram 2013 - this is confirmed again by CTa this admit - recs per Neuro   Elevated troponin  Troponin elevated to 1.48 on admission, and is now steadily trending down - denies any recent chest pain - no acute changes on EKG - if WMA noted on TTE may need cardiac eval   Hyponatremia  Likely simple volume depletion - hydrate and follow  COPD  Well compensated at this time   Polysubstance abuse  Admits to ongoing IV heroin use - reports wanting to quit but scared of withdrawal - Social work consulted    DM Unclear if he is truly diabetic or not - is not on DM meds at home - check A1c - was 5.19 August 2017  Hepatitis C Unclear if he has been tx or not, but negative Hep C virus RNA Qual noted May  2019  HTN permissive hypertension for the first 24-48h - only treat PRN if SBP >220 mmHg   Hx of PE  No PE on CTa chest this admit   1.0 x 0.9 Posterior R lung base nodular appearance noncontrast enhanced CT in 6 months is suggested to assess for stability   DVT prophylaxis: lovenox  Code Status: FULL CODE Family Communication: no family present at time of exam  Disposition Plan:   Consultants:  Neurology   Antimicrobials:  none  Objective: Blood pressure 138/76, pulse 85, temperature 98.1 F (36.7 C), temperature source Oral, resp. rate 18, SpO2 100 %.  Intake/Output Summary (Last 24 hours) at 10/09/2017 1347 Last data filed at 10/09/2017 3846 Gross per 24 hour  Intake -  Output 325 ml  Net -325 ml   There were no vitals filed for this visit.  Examination: General: No acute respiratory distress Lungs: Clear to auscultation bilaterally without wheezes or crackles Cardiovascular: Regular rate and rhythm without murmur gallop or rub normal S1 and S2 Abdomen: Nontender, nondistended, soft, bowel sounds positive, no rebound, no ascites, no appreciable mass Extremities: No significant cyanosis, clubbing, or edema bilateral lower extremities  CBC: Recent Labs  Lab 10/08/17 1335 10/08/17 1850 10/09/17 0340  WBC 4.3  --  4.8  NEUTROABS  --  2.5  --   HGB 9.6*  --  9.8*  HCT 32.7*  --  32.4*  MCV 76.9*  --  74.8*  PLT 285  --  767   Basic Metabolic Panel: Recent Labs  Lab 10/08/17 1335 10/09/17 0340  NA 133* 133*  K 4.1 3.5  CL 100 101  CO2 22 24  GLUCOSE 188* 107*  BUN 21* 18  CREATININE 1.07 0.76  CALCIUM 8.5* 8.1*   GFR: CrCl cannot be calculated (Unknown ideal weight.).  Liver Function Tests: Recent Labs  Lab 10/08/17 1850  AST 48*  ALT 22  ALKPHOS 61  BILITOT 0.5  PROT 7.3  ALBUMIN 2.7*    Coagulation Profile: Recent Labs  Lab 10/08/17 1850  INR 1.17    Cardiac Enzymes: Recent Labs  Lab 10/09/17 0144 10/09/17 0340  TROPONINI  1.35* 1.15*    HbA1C: HbA1c, POC (prediabetic range)  Date/Time Value Ref Range Status  08/30/2017 02:00 PM 5.8 5.7 - 6.4 % Final   Hgb A1c MFr Bld  Date/Time Value Ref Range Status  11/21/2014 03:39 PM 5.5 4.8 - 5.6 % Final    Comment:    (NOTE)         Pre-diabetes: 5.7 - 6.4         Diabetes: >6.4         Glycemic control for adults with diabetes: <7.0   07/24/2014 03:08 AM 6.0 (H) 4.8 - 5.6 % Final    Comment:    (NOTE)         Pre-diabetes: 5.7 - 6.4         Diabetes: >6.4         Glycemic control for adults with diabetes: <7.0     CBG: No results for input(s): GLUCAP in the last 168 hours.  Recent Results (from the past 240 hour(s))  Culture, blood (routine x 2)     Status: None (Preliminary result)   Collection Time: 10/08/17  8:27 PM  Result Value Ref Range Status   Specimen Description BLOOD LEFT FOREARM  Final   Special Requests   Final    BOTTLES DRAWN AEROBIC AND ANAEROBIC Blood Culture adequate volume   Culture   Final    NO GROWTH < 12 HOURS Performed at [facility], [address], [place], [place] [address]    Report Status PENDING  Incomplete  Culture, blood (routine x 2)     Status: None (Preliminary result)   Collection Time: 10/08/17  8:30 PM  Result Value Ref Range Status   Specimen Description BLOOD RIGHT ANTECUBITAL  Final   Special Requests   Final    BOTTLES DRAWN AEROBIC AND ANAEROBIC Blood Culture adequate volume   Culture   Final    NO GROWTH < 12 HOURS Performed at [facility], [place] [address] [place],  [address]    Report Status PENDING  Incomplete     Scheduled Meds: .  stroke: mapping our early stages of recovery book   Does not apply Once  . aspirin  300 mg Rectal Daily   Or  . aspirin  325 mg Oral Daily  . atorvastatin  80 mg Oral q1800  . clopidogrel  75 mg Oral Daily  . enoxaparin (LOVENOX) injection  40 mg Subcutaneous Q24H  . hydrOXYzine  25 mg Oral BID  . nicotine  21 mg Transdermal Daily  .  pantoprazole  20 mg Oral QAC breakfast  . sodium chloride flush  3 mL Intravenous Q12H     LOS: 0 days  Cherene Altes, MD [facility] Office  [phone] Pager - Text Page per Amion  If 7PM-7AM, please contact night-coverage per Amion 10/09/2017, 1:47 PM

## 2017-10-09 NOTE — ED Notes (Signed)
Pt. Given coke to drink per request.

## 2017-10-10 LAB — CBC
HEMATOCRIT: 30.2 % — AB (ref 39.0–52.0)
Hemoglobin: 9 g/dL — ABNORMAL LOW (ref 13.0–17.0)
MCH: 22.3 pg — AB (ref 26.0–34.0)
MCHC: 29.8 g/dL — AB (ref 30.0–36.0)
MCV: 74.9 fL — AB (ref 78.0–100.0)
PLATELETS: 260 10*3/uL (ref 150–400)
RBC: 4.03 MIL/uL — ABNORMAL LOW (ref 4.22–5.81)
RDW: 18.3 % — AB (ref 11.5–15.5)
WBC: 4.2 10*3/uL (ref 4.0–10.5)

## 2017-10-10 LAB — BASIC METABOLIC PANEL
Anion gap: 5 (ref 5–15)
BUN: 12 mg/dL (ref 6–20)
CO2: 26 mmol/L (ref 22–32)
Calcium: 8.1 mg/dL — ABNORMAL LOW (ref 8.9–10.3)
Chloride: 106 mmol/L (ref 98–111)
Creatinine, Ser: 0.75 mg/dL (ref 0.61–1.24)
GFR calc Af Amer: >60 mL/min (ref >60)
GFR calc non Af Amer: >60 mL/min (ref >60)
GLUCOSE: 129 mg/dL — AB (ref 70–99)
Potassium: 3.6 mmol/L (ref 3.5–5.1)
Sodium: 137 mmol/L (ref 135–145)

## 2017-10-10 LAB — HEPATIC FUNCTION PANEL
ALT: 20 U/L (ref 0–44)
AST: 34 U/L (ref 15–41)
Albumin: 2.3 g/dL — ABNORMAL LOW (ref 3.5–5.0)
Alkaline Phosphatase: 60 U/L (ref 38–126)
BILIRUBIN TOTAL: 0.4 mg/dL (ref 0.3–1.2)
Total Protein: 7.2 g/dL (ref 6.5–8.1)

## 2017-10-10 LAB — TROPONIN I: Troponin I: 0.87 ng/mL (ref <0.03)

## 2017-10-10 MED ORDER — CLOPIDOGREL BISULFATE 75 MG PO TABS: 75.0000 mg | ORAL_TABLET | Freq: Every day | ORAL | 3 refills | Status: DC

## 2017-10-10 MED ORDER — INFLUENZA VAC SPLIT QUAD 0.5 ML IM SUSY: 0.5000 mL | PREFILLED_SYRINGE | INTRAMUSCULAR | Status: DC

## 2017-10-10 MED ORDER — PNEUMOCOCCAL VAC POLYVALENT 25 MCG/0.5ML IJ INJ: 0.5000 mL | INJECTION | INTRAMUSCULAR | Status: DC

## 2017-10-10 MED ORDER — NICOTINE 21 MG/24HR TD PT24: 21.0000 mg | MEDICATED_PATCH | Freq: Every day | TRANSDERMAL | 0 refills | Status: DC

## 2017-10-10 MED ORDER — ATORVASTATIN CALCIUM 80 MG PO TABS: 80.0000 mg | ORAL_TABLET | Freq: Every day | ORAL | 0 refills | Status: DC

## 2017-10-10 MED ORDER — ASPIRIN EC 81 MG PO TBEC: 81.0000 mg | DELAYED_RELEASE_TABLET | Freq: Every day | ORAL | 0 refills | Status: DC

## 2017-10-10 NOTE — Discharge Instructions (Signed)
Ischemic Stroke °An ischemic stroke is the sudden death of brain tissue. Blood carries oxygen to all areas of the body. This type of stroke happens when your blood does not flow to your brain like normal. Your brain cannot get the oxygen it needs. This is an emergency. It must be treated right away. °Symptoms of a stroke usually happen all of a sudden. You may notice them when you wake up. They can include: °· Weakness or loss of feeling in your face, arm, or leg. This often happens on one side of the body. °· Trouble walking. °· Trouble moving your arms or legs. °· Loss of balance or coordination. °· Feeling confused. °· Trouble talking or understanding what people are saying. °· Slurred speech. °· Trouble seeing. °· Seeing two of one object (double vision). °· Feeling dizzy. °· Feeling sick to your stomach (nauseous) and throwing up (vomiting). °· A very bad headache for no reason. ° °Get help as soon as any of these problems start. This is important. Some treatments work better if they are given right away. These include: °· Aspirin. °· Medicines to control blood pressure. °· A shot (injection) of medicine to break up the blood clot. °· Treatments given in the blood vessel (artery) to take out the clot or break it up. ° °Other treatments may include: °· Oxygen. °· Fluids given through an IV tube. °· Medicines to thin out your blood. °· Procedures to help your blood flow better. ° °What increases the risk? °Certain things may make you more likely to have a stroke. Some of these are things that you can change, such as: °· Being very overweight (obesity). °· Smoking. °· Taking birth control pills. °· Not being active. °· Drinking too much alcohol. °· Using drugs. ° °Other risk factors include: °· High blood pressure. °· High cholesterol. °· Diabetes. °· Heart disease. °· Being African American, Native American, Hispanic, or [place] Native. °· Being over age 60. °· Family history of stroke. °· Having had blood clots,  stroke, or warning stroke (transient ischemic attack, TIA) in the past. °· Sickle cell disease. °· Being a woman with a history of high blood pressure in pregnancy (preeclampsia). °· Migraine headache. °· Sleep apnea. °· Having an irregular heartbeat (atrial fibrillation). °· Long-term (chronic) diseases that cause soreness and swelling (inflammation). °· Disorders that affect how your blood clots. ° °Follow these instructions at home: °Medicines °· Take over-the-counter and prescription medicines only as told by your doctor. °· If you were told to take aspirin or another medicine to thin your blood, take it exactly as told by your doctor. °? Taking too much of the medicine can cause bleeding. °? If you do not take enough, it may not work as well. °· Know the side effects of your medicines. If you are taking a blood thinner, make sure you: °? Hold pressure over any cuts for longer than usual. °? Tell your dentist and other doctors that you take this medicine. °? Avoid activities that may cause damage or injury to your body. °Eating and drinking °· Follow instructions from your doctor about what you cannot eat or drink. °· Eat healthy foods. °· If you have trouble with swallowing, do these things to avoid choking: °? Take small bites when eating. °? Eat foods that are soft or pureed. °Safety °· Follow instructions from your health care team about physical activity. °· Use a walker or cane as told by your doctor. °· Keep your home safe so   you do not fall. This may include: °? Having experts look at your home to make sure it is safe. °? Putting grab bars in the bedroom and bathroom. °? Using raised toilets. °? Putting a seat in the shower. °General instructions °· Do not use any tobacco products. °? Examples of these are cigarettes, chewing tobacco, and e-cigarettes. °? If you need help quitting, ask your doctor. °· Limit how much alcohol you drink. This means no more than 1 drink a day for nonpregnant women and 2  drinks a day for men. One drink equals 12 oz of beer, 5 oz of wine, or 1½ oz of hard liquor. °· If you need help to stop using drugs or alcohol, ask your doctor to refer you to a program or specialist. °· Stay active. Exercise as told by your doctor. °· Keep all follow-up visits as told by your doctor. This is important. °Get help right away if: °· You suddenly: °? Have weakness or loss of feeling in your face, arm, or leg. °? Feel confused. °? Have trouble talking or understanding what people are saying. °? Have trouble seeing. °? Have trouble walking. °? Have trouble moving your arms or legs. °? Feel dizzy. °? Lose your balance or coordination. °? Have a very bad headache and you do not know why. °· You pass out (lose consciousness) or almost pass out. °· You have jerky movements that you cannot control (seizure). °These symptoms may be an emergency. Do not wait to see if the symptoms will go away. Get medical help right away. Call your local emergency services (911 in the [place] Do not drive yourself to the hospital. °This information is not intended to replace advice given to you by your health care provider. Make sure you discuss any questions you have with your health care provider. °Document Released: 12/18/2010 Document Revised: 06/11/2015 Document Reviewed: 03/27/2015 °Elsevier Interactive Patient Education © 2018 [employer] ° °

## 2017-10-10 NOTE — Progress Notes (Signed)
NURSING PROGRESS NOTE  [name] [id number] Discharge Data: 10/10/2017 12:02 PM Attending Provider: Bonnielee Haff, MD POE:UMPN, Ander Gaster, MD     [name] to be D/C'd Home per MD order.  Discussed with the patient the After Visit Summary and all questions fully answered. All IV's discontinued with no bleeding noted. All belongings returned to patient for patient to take home.   Last Vital Signs:  Blood pressure 138/74, pulse 75, temperature 98.2 F (36.8 C), temperature source Oral, resp. rate 18, SpO2 98 %.  Discharge Medication List Allergies as of 10/10/2017      Reactions   Buprenorphine Hcl-naloxone Hcl Shortness Of Breath, Other (See Comments)   "Felt like I was going to die," was  jittery, had trouble breathing, felt hot (reaction to Suboxone) PER THE [place] database, the patient received #42 Suboxone films between 03/26/17 and 04/02/17 from [facility](??)   Ciprofloxacin Anaphylaxis   Morphine And Related Other (See Comments)   Seizures   Morphine-naltrexone Other (See Comments)   Seizures   Shellfish-derived Products Anaphylaxis, Hives, Swelling   Benadryl [diphenhydramine Hcl] Hives, Itching, Rash   Fish Allergy Hives, Swelling, Rash   Vicodin [hydrocodone-acetaminophen] Nausea Only      Medication List    STOP taking these medications   cetirizine-pseudoephedrine 5-120 MG tablet Commonly known as:  ZYRTEC-D   ranitidine 150 MG tablet Commonly known as:  ZANTAC     TAKE these medications   albuterol 108 (90 Base) MCG/ACT inhaler Commonly known as:  PROVENTIL HFA;VENTOLIN HFA Inhale 2 puffs into the lungs every 4 (four) hours as needed for wheezing or shortness of breath.   albuterol (2.5 MG/3ML) 0.083% nebulizer solution Commonly known as:  PROVENTIL Take 3 mLs (2.5 mg total) by nebulization every 6 (six) hours as needed for wheezing or shortness of breath.   aspirin EC 81 MG tablet Take 1 tablet (81 mg total) by mouth daily for  21 days.   atorvastatin 80 MG tablet Commonly known as:  LIPITOR Take 1 tablet (80 mg total) by mouth daily at 6 PM.   clopidogrel 75 MG tablet Commonly known as:  PLAVIX Take 1 tablet (75 mg total) by mouth daily.   fluticasone 50 MCG/ACT nasal spray Commonly known as:  FLONASE Place 2 sprays into both nostrils daily. What changed:    when to take this  reasons to take this   hydrOXYzine 25 MG capsule Commonly known as:  VISTARIL Take 1 capsule (25 mg total) by mouth 2 (two) times daily. Itching   nicotine 21 mg/24hr patch Commonly known as:  NICODERM CQ - dosed in mg/24 hours Place 1 patch (21 mg total) onto the skin daily. Start taking on:  10/11/2017   ondansetron 4 MG tablet Commonly known as:  ZOFRAN Take 1 tablet (4 mg total) by mouth every 8 (eight) hours as needed for nausea or vomiting.   oxyCODONE-acetaminophen 10-325 MG tablet Commonly known as:  PERCOCET Take 1 tablet by mouth every 6 (six) hours as needed for pain.   pantoprazole 20 MG tablet Commonly known as:  PROTONIX Take 1 tablet (20 mg total) by mouth daily before breakfast.   pregabalin 75 MG capsule Commonly known as:  LYRICA TAKE 1 CAPSULE BY MOUTH THREE TIMES A DAY What changed:    how much to take  how to take this  when to take this  additional instructions

## 2017-10-10 NOTE — Progress Notes (Signed)
STROKE TEAM PROGRESS NOTE       SUBJECTIVE (INTERVAL HISTORY) His family is not  at the bedside.   He states that he is doing fine and he is going home. transthoracic echocardiogram was normal. Blood cultures are negative so far  OBJECTIVE Vitals:   10/09/17 1748 10/09/17 2329 10/10/17 0337 10/10/17 0832  BP: 122/61 138/68 131/67 138/74  Pulse: 80 82 74 75  Resp: 20 18 18 18   Temp: 98.5 F (36.9 C) 98 F (36.7 C) 97.7 F (36.5 C) 98.2 F (36.8 C)  TempSrc:  Oral Oral Oral  SpO2: 100% 100% 100% 98%    CBC:  Recent Labs  Lab 10/08/17 1850 10/09/17 0340 10/10/17 0630  WBC  --  4.8 4.2  NEUTROABS 2.5  --   --   HGB  --  9.8* 9.0*  HCT  --  32.4* 30.2*  MCV  --  74.8* 74.9*  PLT  --  272 734    Basic Metabolic Panel:  Recent Labs  Lab 10/09/17 0340 10/10/17 0630  NA 133* 137  K 3.5 3.6  CL 101 106  CO2 24 26  GLUCOSE 107* 129*  BUN 18 12  CREATININE 0.76 0.75  CALCIUM 8.1* 8.1*    Lipid Panel:     Component Value Date/Time   CHOL 114 10/09/2017 0340   TRIG 236 (H) 10/09/2017 0340   HDL 12 (L) 10/09/2017 0340   CHOLHDL 9.5 10/09/2017 0340   VLDL 47 (H) 10/09/2017 0340   LDLCALC 55 10/09/2017 0340   HgbA1c:  Lab Results  Component Value Date   HGBA1C 6.5 (H) 10/09/2017   Urine Drug Screen:     Component Value Date/Time   LABOPIA POSITIVE (A) 10/08/2017 1935   COCAINSCRNUR NONE DETECTED 10/08/2017 1935   COCAINSCRNUR NEG 04/12/2014 1136   LABBENZ NONE DETECTED 10/08/2017 1935   LABBENZ NEG 02/18/2009 2050   AMPHETMU NONE DETECTED 10/08/2017 1935   THCU NONE DETECTED 10/08/2017 1935   LABBARB NONE DETECTED 10/08/2017 1935    Alcohol Level     Component Value Date/Time   ETH <10 10/08/2017 1850    IMAGING  Ct Angio Head W Or Wo Contrast Ct Angio Neck W Or Wo Contrast 10/09/2017 IMPRESSION:   CTA NECK:  1. Chronically occluded LEFT ICA. No hemodynamically significant stenosis RIGHT ICA.  2. Luminal irregularity LEFT vertebral artery  most compatible with old dissection without flow-limiting stenosis.  3. Luminal irregularity RIGHT vertebral artery seen with old dissection or atherosclerosis. Severe stenosis RIGHT vertebral artery origin.  4. Suspected RIGHT subclavian artery non flow-limiting dissection flap. No pseudoaneurysm.   CTA HEAD:  1. No emergent large vessel occlusion.  Patent cerebral arteries.  2. Occluded LEFT ICA, reconstitution at carotid terminus.  3. Severe stenosis versus focally occluded RIGHT petrous ICA, severe stenosis RIGHT carotid siphon.  4. Severe stenosis LEFT V4 segment.  Emphysema (ICD10-J43.9).   Aortic Atherosclerosis (ICD10-I70.0).    Dg Chest 2 View 10/08/2017 IMPRESSION:  1. No acute cardiopulmonary disease.  2. 9 mm nodular opacity in the right lower lobe likely reflecting a nipple shadow. Recommend repeat chest x-ray with nipple markers.    Ct Head Wo Contrast 10/08/2017 IMPRESSION:  No acute findings.  Multiple infarcts as before.    Ct Angio Chest Pe W Or Wo Contrast 10/09/2017 IMPRESSION:  1. No demonstrable pulmonary embolus. No thoracic aortic aneurysm or dissection. There is aortic atherosclerosis. There are foci of great vessel and coronary artery calcification.  2. Slightly prominent  right hilar lymph nodes of uncertain etiology. Scattered subcentimeter lymph nodes elsewhere present.  3. Areas of atelectatic change and mild lower lobe bronchiectatic change. Probable rounded atelectasis right base posteriorly. This area measures 1.0 x 0.9 cm, stable, and does have a somewhat nodular appearance. Given this appearance, a noncontrast enhanced CT in 6 months to assess for stability may be reasonable.  4.  Stable adrenal hypertrophy.  5. Multiple prior fractures. No acute appearing fracture. No blastic or lytic bone lesions evident.  Aortic Atherosclerosis (ICD10-I70.0).    Mr Brain Wo Contrast 10/08/2017 IMPRESSION:  1. Acute small LEFT frontoparietal/MCA territory  nonhemorrhagic infarcts.  2. Old LEFT MCA and posterior border zone territory infarcts. Old basal ganglia and thalami infarcts.  3. Bifrontal and RIGHT basal ganglia encephalomalacia at site of prior brain abscess.  4. Mild parenchymal brain volume loss.  5. Chronically occluded LEFT ICA.      Transthoracic Echocardiogram  10/09/2017 Study Conclusions - Left ventricle: The cavity size was normal. Wall thickness was   normal. Systolic function was normal. The estimated ejection   fraction was in the range of 55% to 60%. Wall motion was normal;   there were no regional wall motion abnormalities. Doppler   parameters are consistent with abnormal left ventricular   relaxation (grade 1 diastolic dysfunction). - Mitral valve: Calcified annulus. - Left atrium: The atrium was mildly dilated. Impressions: - Normal LV systolic function; mild diastolic dysfunction;   sclerotic aortic valve; mild LAE.      PHYSICAL EXAM Blood pressure 138/74, pulse 75, temperature 98.2 F (36.8 C), temperature source Oral, resp. rate 18, SpO2 98 %.  Frail unkempt cachectic middle-aged Caucasian male not in distress. . Afebrile. Head is nontraumatic. Neck is supple without bruit.    Cardiac exam no murmur or gallop. Lungs are clear to auscultation. Distal pulses are well felt. Neurological Exam ;  Awake  Alert oriented x 3. Normal speech and language.eye movements full without nystagmus.fundi were not visualized. Vision acuity and fields appear normal. Hearing is normal. Palatal movements are normal. Face symmetric. Tongue midline. Normal strength, tone, reflexes and coordination. Normal sensation. Gait deferred.      ASSESSMENT/PLAN Mr. HARLEM BULA is a 57 y.o. male with history of previous stroke, obstructive sleep apnea, implantable defibrillator, peripheral vascular disease - Dr Donnetta Hutching, previous pulmonary embolus, IV drug abuse, prior brain abscess, panic attacks, hypertension, hyperlipidemia,  hepatitis C, diabetes, COPD, congestive heart failure, carotid stenosis with previous stenting performed by Dr. [name], chronic pain, depression, and seizure disorder presenting with right-sided weakness. He did not receive IV t-PA due to late presentation.  Stroke: Left MCA infarcts - embolic - etiology unclear.  Resultant  Mild dysarthria which has improved  CT head -no acute findings.  Multiple previous infarcts.  MRI head - Acute small LEFT frontoparietal/MCA territory nonhemorrhagic infarcts. Multiple remote infarcts.  MRA head - not performed  CTA H&N - occluded Lt ICA with severe stenosis Rt ICA, Rt carotid siphon, Lt V4 segment, Rt VA origin.  Carotid Doppler - CTA neck performed - carotid dopplers not indicated.  2D Echo  - EF 55 - 60%. No cardiac source of emboli identified.   LDL - 55  HgbA1c - 6.5  VTE prophylaxis - Lovenox  Diet -  - Heart healthy with thin liquids.  clopidogrel 75 mg daily prior to admission, now on aspirin 325 mg daily  Patient counseled to be compliant with his antithrombotic medications  Ongoing aggressive stroke risk factor management  Therapy recommendations:  HHPT recommended  Disposition: For discharge today.  Hypertension  Stable . Permissive hypertension (OK if < 220/120) but gradually normalize in 5-7 days . Long-term BP goal normotensive  Hyperlipidemia  Lipid lowering medication PTA:  none  LDL 55, goal < 70  Current lipid lowering medication: Lipitor 80 mg daily  Continue statin at discharge  Diabetes  HgbA1c 6.5, goal < 7.0  Controlled  Other Stroke Risk Factors  Cigarette smoker - advised to stop smoking  Hx stroke/TIA  History of substance abuse  Obstructive sleep apnea   Other Active Problems  Anemia  CTA Chest - nodular area right lung base -may need follow-up.      Hospital day # 1      Antiplatelet therapy with aspirin and Plavix for 3 weeks followed by aspirin alone. Patient advised  to quit smoking cigarettes and doing drugs. Discuss with Dr.Krishnan .stroke team will sign off. Kindly call for questions.    To contact Stroke Continuity provider, please refer to [email or web address] After hours, contact General Neurology

## 2017-10-10 NOTE — Progress Notes (Signed)
RN reviewed POC with patient regarding discharge to home today. Patient agreed and denied any suicidal ideation at this time. He is requesting to walk in hallway  By himself to "make sure I can walk to go home". Stand by assist close by.  Ave Filter, RN

## 2017-10-10 NOTE — Evaluation (Signed)
Occupational Therapy Evaluation Patient Details Name: [name] MRN: [id number] DOB: 12/11/1960 Today's Date: 10/10/2017    History of Present Illness Pt is a 57 y.o. M with significant PMH of injection drug use heroin, chronic pain, CHF, history of prior brain abscess, diabetes, hepatitis C, hypertension, hyperlipidemia, pulmonary embolism, peripheral vascular disease, strokes who presents for evaluation of generalized weakness. MRI showing embolic-looking strokes in left cerebral hemisphere and chronically occluded left ICA   Clinical Impression   PTA, pt reports he was living alone ("in the woods") and was independent; pt states he plans on going to a friends house at [employer]. Currently, pt performs ADLs and functional mobility using single point cane at supervision level. Pt presenting near baseline function and reports he feels back to baseline. Answered all pt questions and provided education on stroke signs and symptoms; pt verbalized understand stating "this isn't my first time". Recommend dc home once medically stable per physician. All acute OT needs met and will sign off. Thank you.     Follow Up Recommendations  No OT follow up;Supervision/Assistance - 24 hour    Equipment Recommendations  None recommended by OT    Recommendations for Other Services PT consult     Precautions / Restrictions Precautions Precautions: Fall Restrictions Weight Bearing Restrictions: No      Mobility Bed Mobility               General bed mobility comments: At EOB upon arrival  Transfers Overall transfer level: Needs assistance Equipment used: Straight cane Transfers: Sit to/from Stand Sit to Stand: Supervision         General transfer comment: supervision for safety    Balance Overall balance assessment: Mild deficits observed, not formally tested                                         ADL either performed or assessed with clinical judgement   ADL  Overall ADL's : Needs assistance/impaired                                       General ADL Comments: Pt performing ADLs and functional mobility at supervision level. Presents near baseline fucntion. Pt eager to dc from hospital.     Vision Baseline Vision/History: ("I should have glasses but I don't have glasses") Patient Visual Report: Blurring of vision(Blurry all the time) Vision Assessment?: Yes Eye Alignment: Within Functional Limits Ocular Range of Motion: Within Functional Limits Alignment/Gaze Preference: Within Defined Limits Tracking/Visual Pursuits: Decreased smoothness of horizontal tracking;Decreased smoothness of vertical tracking Convergence: Within functional limits     Perception     Praxis      Pertinent Vitals/Pain Pain Assessment: Faces Faces Pain Scale: Hurts little more Pain Location: BLEs; chronic Pain Descriptors / Indicators: Discomfort Pain Intervention(s): Monitored during session     Hand Dominance Right   Extremity/Trunk Assessment Upper Extremity Assessment Upper Extremity Assessment: RUE deficits/detail RUE Deficits / Details: WFL strength and cooridnation. Performing both FM and gross motor tasks without difficulty. Denies any numbness or tingling   Lower Extremity Assessment Lower Extremity Assessment: Defer to PT evaluation   Cervical / Trunk Assessment Cervical / Trunk Assessment: Kyphotic   Communication Communication Communication: No difficulties   Cognition Arousal/Alertness: Awake/alert Behavior During Therapy: Restless Overall Cognitive Status: Within Functional  Limits for tasks assessed Area of Impairment: Safety/judgement                         Safety/Judgement: Decreased awareness of safety     General Comments: Feel pt is at cognitive baseline. Presents with decreased safety awareness.    General Comments  Brother present throughout session    Exercises     Shoulder Instructions       Home Living Family/patient expects to be discharged to:: Shelter/Homeless                                 Additional Comments: Patient reports he has been living in the woods for the past 2 months. He states that he plans to dc to a friends house.      Prior Functioning/Environment Level of Independence: Needs assistance  Gait / Transfers Assistance Needed: uses cane for mobility ADL's / Homemaking Assistance Needed: Pt reports he performs all his ADLs            OT Problem List: Decreased activity tolerance;Impaired balance (sitting and/or standing);Decreased safety awareness;Decreased knowledge of use of DME or AE;Pain      OT Treatment/Interventions:      OT Goals(Current goals can be found in the care plan section) Acute Rehab OT Goals Patient Stated Goal: none stated; agreeable to participate in evaluation OT Goal Formulation: All assessment and education complete, DC therapy  OT Frequency:     Barriers to D/C:            Co-evaluation              AM-PAC PT "6 Clicks" Daily Activity     Outcome Measure Help from another person eating meals?: None Help from another person taking care of personal grooming?: None Help from another person toileting, which includes using toliet, bedpan, or urinal?: None Help from another person bathing (including washing, rinsing, drying)?: A Little Help from another person to put on and taking off regular upper body clothing?: None Help from another person to put on and taking off regular lower body clothing?: A Little 6 Click Score: 22   End of Session Equipment Utilized During Treatment: Other (comment)(SPC) Nurse Communication: Mobility status  Activity Tolerance: Patient tolerated treatment well Patient left: in bed;with call bell/phone within reach;with family/visitor present  OT Visit Diagnosis: Unsteadiness on feet (R26.81);Other abnormalities of gait and mobility (R26.89);Muscle weakness  (generalized) (M62.81)                Time: 2929-0903 OT Time Calculation (min): 9 min Charges:  OT General Charges $OT Visit: 1 Visit OT Evaluation $OT Eval Moderate Complexity: [place], OTR/L Acute Rehab Pager: [phone] Office: [place] 10/10/2017, 12:41 PM

## 2017-10-10 NOTE — Discharge Summary (Addendum)
[facility]  Physician Discharge Summary   Patient ID: [name] MRN: [id number] DOB/AGE: 1960-04-26 57 y.o.  Admit date: 10/08/2017 Discharge date: 10/10/2017  PCP: Antony Blackbird, MD  DISCHARGE DIAGNOSES:  Acute stroke Medical noncompliance  RECOMMENDATIONS FOR OUTPATIENT FOLLOW UP: 1. Outpatient follow-up with neurology and primary care provider 2. Needs CT chest repeated in 6 months to evaluate right lung nodular density and prominent hilar lymph nodes noted on CT scan done during this hospitalization.   DISCHARGE CONDITION: fair  Diet recommendation: Heart healthy   INITIAL HISTORY: 57 y.o.malew/ a hx ofIV drug abuse, alcohol abuse in remission, CVA, chronic microcytic anemia, and COPD who presented to the ED w/ right-sided weakness yesterday that spontaneously resolved. He admitted to ongoing IV heroin use.  In the ED CT head was negative for acute finding, but notable for multiple old infarcts.  Consultations:  Neurology  Procedures: Transthoracic echocardiogram Study Conclusions  - Left ventricle: The cavity size was normal. Wall thickness was   normal. Systolic function was normal. The estimated ejection   fraction was in the range of 55% to 60%. Wall motion was normal;   there were no regional wall motion abnormalities. Doppler   parameters are consistent with abnormal left ventricular   relaxation (grade 1 diastolic dysfunction). - Mitral valve: Calcified annulus. - Left atrium: The atrium was mildly dilated.  Impressions:  - Normal LV systolic function; mild diastolic dysfunction;   sclerotic aortic valve; mild LAE.   HOSPITAL COURSE:   Acute L frontoparietal CVA - multiple small L hemisphere CVAs of various age Patient was seen by neurology.  Patient underwent stroke work-up in the hospital.  See neurology note for further details.  They recommend aspirin and Plavix for 3 weeks followed by single agent.  Discussed in person with  Dr. [name].  After 3 weeks he can be either on aspirin or Plavix.  Complains his Plavix has been questionable.  Will defer choice of agent to his primary care provider due to history of vasculopathy previously but we will write him a prescription for Plavix at this time.  Seen by physical therapy.  Home health was recommended.  LDL 55.  HbA1c 6.5.  IVD abuse  No evidence for endocarditis on echocardiogram.  Hx of brain abscess  Stable.  None noted on current imaging studies.  Carotid artery disease Known 70% R carotid stenosis and occluded L ICA per arteriogram 2013 - this is confirmed again by CTa this admit.  No specific recommendations by neurology.  Aggressive risk factor management.  Antithrombotic agents.  Statins.  History of peripheral vascular disease Followed by vascular surgery, Dr. Donnetta Hutching.  He has had lower extremity bypass surgeries previously.  Stent placements previously.  Outpatient follow-up.  Elevated troponin Troponin elevated to 1.48 on admission, and steadily trending down.  Patient denied any chest pain.  No acute changes were noted on EKG.  Echocardiogram did not show any wall motion abnormalities.  This might have been demand ischemia or due to stroke.  Outpatient follow-up with PCP.  Hyponatremia  Likely simple volume depletion.  Patient was hydrated.  Sodium level is now normal  COPD Well compensated at this time   Polysubstance abuse She was counseled.  DM type II HbA1c 6.5.  Outpatient follow-up with PCP.  Hepatitis C Unclear if he has been tx or not, but negative Hep C virus RNA Qual noted May 2019  Essential hypertension Continue outpatient management  Hx of PE  No PE on CTa chest  this admit   1.0 x 0.9 Posterior R lung base nodular appearance noncontrast enhanced CT in 6 months is suggested to assess for stability   Microcytic anemia No overt blood loss.  Hemoglobin is stable.  Outpatient evaluation.  Overall stable.  Okay for  discharge today.   PERTINENT LABS:  The results of significant diagnostics from this hospitalization (including imaging, microbiology, ancillary and laboratory) are listed below for reference.    Microbiology: Recent Results (from the past 240 hour(s))  Culture, blood (routine x 2)     Status: None (Preliminary result)   Collection Time: 10/08/17  8:27 PM  Result Value Ref Range Status   Specimen Description BLOOD LEFT FOREARM  Final   Special Requests   Final    BOTTLES DRAWN AEROBIC AND ANAEROBIC Blood Culture adequate volume   Culture   Final    NO GROWTH 2 DAYS Performed at [facility], [address], [place], [place] [address]    Report Status PENDING  Incomplete  Culture, blood (routine x 2)     Status: None (Preliminary result)   Collection Time: 10/08/17  8:30 PM  Result Value Ref Range Status   Specimen Description BLOOD RIGHT ANTECUBITAL  Final   Special Requests   Final    BOTTLES DRAWN AEROBIC AND ANAEROBIC Blood Culture adequate volume   Culture   Final    NO GROWTH 2 DAYS Performed at [facility], [place] [address] [place], [place] [address]    Report Status PENDING  Incomplete     Labs: Basic Metabolic Panel: Recent Labs  Lab 10/08/17 1335 10/09/17 0340 10/10/17 0630  NA 133* 133* 137  K 4.1 3.5 3.6  CL 100 101 106  CO2 22 24 26   GLUCOSE 188* 107* 129*  BUN 21* 18 12  CREATININE 1.07 0.76 0.75  CALCIUM 8.5* 8.1* 8.1*   Liver Function Tests: Recent Labs  Lab 10/08/17 1850 10/10/17 0630  AST 48* 34  ALT 22 20  ALKPHOS 61 60  BILITOT 0.5 0.4  PROT 7.3 7.2  ALBUMIN 2.7* 2.3*   CBC: Recent Labs  Lab 10/08/17 1335 10/08/17 1850 10/09/17 0340 10/10/17 0630  WBC 4.3  --  4.8 4.2  NEUTROABS  --  2.5  --   --   HGB 9.6*  --  9.8* 9.0*  HCT 32.7*  --  32.4* 30.2*  MCV 76.9*  --  74.8* 74.9*  PLT 285  --  272 260   Cardiac Enzymes: Recent Labs  Lab 10/09/17 0144 10/09/17 0340 10/09/17 1402  TROPONINI 1.35* 1.15* 0.87*      IMAGING STUDIES Ct Angio Head W Or Wo Contrast  Result Date: 10/09/2017 CLINICAL DATA:  Follow up stroke. History of stroke, brain abscess, LEFT ICA occlusion. EXAM: CT ANGIOGRAPHY HEAD AND NECK TECHNIQUE: Multidetector CT imaging of the head and neck was performed using the standard protocol during bolus administration of intravenous contrast. Multiplanar CT image reconstructions and MIPs were obtained to evaluate the vascular anatomy. Carotid stenosis measurements (when applicable) are obtained utilizing NASCET criteria, using the distal internal carotid diameter as the denominator. CONTRAST:  80mL ISOVUE-370 IOPAMIDOL (ISOVUE-370) INJECTION 76% COMPARISON:  MRI head October 08, 2017 and [facility] neuro angiogram May 23, 2013. FINDINGS: CTA NECK FINDINGS: AORTIC ARCH: Normal appearance of the thoracic arch, normal branch pattern. Mild calcific atherosclerosis and intimal thickening aortic arch. The origins of the innominate, left Common carotid artery and subclavian artery are patent. Linear filling defect RIGHT subclavian  artery distal to vertebral artery origin concerning for non flow-limiting dissection. Moderate luminal irregularity bilateral subclavian arteries compatible with atherosclerosis. RIGHT CAROTID SYSTEM: Common carotid artery is patent, mild luminal irregularity compatible with atherosclerosis. Moderate calcific atherosclerosis carotid bifurcation without hemodynamically significant stenosis by NASCET criteria. Normal appearance of the internal carotid artery. LEFT CAROTID SYSTEM: Common carotid artery is patent. Moderate calcific atherosclerosis LEFT carotid bifurcation. LEFT internal carotid artery is occluded within 9 mm of the origin with calcific capping and calcifications along the occluded course. No reconstitution in the neck. VERTEBRAL ARTERIES:RIGHT vertebral artery is dominant. Severe stenosis RIGHT vertebral artery origin. Moderate luminal irregularity LEFT vertebral artery,  vessel is smaller than the transverse foramen. Mild luminal irregularity RIGHT vertebral artery with calcific atherosclerosis. SKELETON: No acute osseous process though bone windows have not been submitted. Status post C4-5 ACDF with arthrodesis. OTHER NECK: Soft tissues of the neck are nonacute though, not tailored for evaluation. UPPER CHEST: Included lung apices are clear. Centrilobular emphysema. No superior mediastinal lymphadenopathy. CTA HEAD FINDINGS: ANTERIOR CIRCULATION: Atherosclerosis resulting in severe stenosis versus focally occluded RIGHT petrous segment. Severe stenosis RIGHT carotid siphon due to calcific atherosclerosis. Faint reconstitution LEFT carotid terminus. Patent anterior and middle cerebral arteries, mild luminal irregularity compatible with atherosclerosis. No large vessel occlusion, contrast extravasation or aneurysm. POSTERIOR CIRCULATION: Patent vertebral arteries, vertebrobasilar junction and basilar artery, as well as main branch vessels. Moderate luminal irregularity RIGHT vertebral artery. Focal severe stenosis LEFT V4 segment due to atherosclerosis. Patent posterior cerebral arteries, mild luminal irregularity compatible with atherosclerosis. No large vessel occlusion, contrast extravasation or aneurysm. VENOUS SINUSES: Major dural venous sinuses are patent though not tailored for evaluation on this angiographic examination. ANATOMIC VARIANTS: None. DELAYED PHASE: No abnormal intracranial enhancement. MIP images reviewed. IMPRESSION: CTA NECK: 1. Chronically occluded LEFT ICA. No hemodynamically significant stenosis RIGHT ICA. 2. Luminal irregularity LEFT vertebral artery most compatible with old dissection without flow-limiting stenosis. 3. Luminal irregularity RIGHT vertebral artery seen with old dissection or atherosclerosis. Severe stenosis RIGHT vertebral artery origin. 4. Suspected RIGHT subclavian artery non flow-limiting dissection flap. No pseudoaneurysm. CTA HEAD: 1. No  emergent large vessel occlusion.  Patent cerebral arteries. 2. Occluded LEFT ICA, reconstitution at carotid terminus. 3. Severe stenosis versus focally occluded RIGHT petrous ICA, severe stenosis RIGHT carotid siphon. 4. Severe stenosis LEFT V4 segment. Emphysema (ICD10-J43.9).  Aortic Atherosclerosis (ICD10-I70.0). Electronically Signed   By: Elon Alas M.D.   On: 10/09/2017 01:48   Dg Chest 2 View  Result Date: 10/08/2017 CLINICAL DATA:  Generalized fatigue, weakness EXAM: CHEST - 2 VIEW COMPARISON:  06/16/2016, 06/19/2016, 02/24/2017 FINDINGS: There is a 9 mm nodular opacity of the right lower lobe likely reflecting a nipple shadow. There is no focal consolidation. There is no pleural effusion or pneumothorax. The heart and mediastinal contours are unremarkable. The osseous structures are unremarkable. IMPRESSION: 1. No acute cardiopulmonary disease. 2. 9 mm nodular opacity in the right lower lobe likely reflecting a nipple shadow. Recommend repeat chest x-ray with nipple markers. Electronically Signed   By: Kathreen Devoid   On: 10/08/2017 20:13   Dg Chest 2 View  Result Date: 09/27/2017 CLINICAL DATA:  Weakness.  Hypertension. EXAM: CHEST - 2 VIEW COMPARISON:  September 11, 2017 FINDINGS: There is mild scarring in the left upper lobe. There is no edema or consolidation. The heart size and pulmonary vascularity are normal. No adenopathy. There is aortic atherosclerosis. There is evidence of an old healed fracture of the left clavicle as well as several  old healed rib fractures on the left. There is postoperative change in the lower cervical region. IMPRESSION: No edema or consolidation. Mild scarring left upper lobe. Heart size normal. There is aortic atherosclerosis. There is evidence of old bony trauma on the left. Aortic Atherosclerosis (ICD10-I70.0). Electronically Signed   By: Lowella Grip III M.D.   On: 09/27/2017 08:52   Ct Head Wo Contrast  Result Date: 10/08/2017 CLINICAL DATA:   Generalized fatigue and weakness with body aches EXAM: CT HEAD WITHOUT CONTRAST TECHNIQUE: Contiguous axial images were obtained from the base of the skull through the vertex without intravenous contrast. COMPARISON:  09/27/2017 FINDINGS: Brain: Chronic stable involutional changes of the brain. Subcortical areas of hypoattenuation in the bifrontal and high parietal lobes compatible with known areas of remote infarct and/or hemorrhage. No acute intracranial hemorrhage is currently identified. Hydrocephalus extra-axial fluid collections. Vascular: Atherosclerosis at the skull base. Skull: No acute skull fracture. Burr holes noted bilaterally as before. Sinuses/Orbits: No acute finding. Other: None. IMPRESSION: No acute findings.  Multiple infarcts as before Electronically Signed   By: Ashley Royalty M.D.   On: 10/08/2017 20:10   Ct Head Wo Contrast  Result Date: 09/27/2017 CLINICAL DATA:  Subacute neuro deficit.  Episodic peripheral vertigo EXAM: CT HEAD WITHOUT CONTRAST TECHNIQUE: Contiguous axial images were obtained from the base of the skull through the vertex without intravenous contrast. COMPARISON:  07/22/2014 FINDINGS: Brain: Multiple remote infarcts seen along the left more than right cerebral convexity. There is low-density in the subcortical high posterior left frontal region where there was blood products on prior scan. Blood products in the right centrum semiovale have also faded. The largest remote infarct is along the left frontal operculum. No evidence of acute infarct. No hemorrhage, hydrocephalus, or collection. Vascular: Atherosclerotic calcification.  No hyperdense vessel Skull: Chronic bilateral burr holes.  No acute finding Sinuses/Orbits: No acute finding IMPRESSION: 1. No acute finding when compared to priors. 2. Multiple remote infarcts. Electronically Signed   By: Monte Fantasia M.D.   On: 09/27/2017 09:30   Ct Angio Neck W Or Wo Contrast  Result Date: 10/09/2017 CLINICAL DATA:  Follow  up stroke. History of stroke, brain abscess, LEFT ICA occlusion. EXAM: CT ANGIOGRAPHY HEAD AND NECK TECHNIQUE: Multidetector CT imaging of the head and neck was performed using the standard protocol during bolus administration of intravenous contrast. Multiplanar CT image reconstructions and MIPs were obtained to evaluate the vascular anatomy. Carotid stenosis measurements (when applicable) are obtained utilizing NASCET criteria, using the distal internal carotid diameter as the denominator. CONTRAST:  17mL ISOVUE-370 IOPAMIDOL (ISOVUE-370) INJECTION 76% COMPARISON:  MRI head October 08, 2017 and [facility] neuro angiogram May 23, 2013. FINDINGS: CTA NECK FINDINGS: AORTIC ARCH: Normal appearance of the thoracic arch, normal branch pattern. Mild calcific atherosclerosis and intimal thickening aortic arch. The origins of the innominate, left Common carotid artery and subclavian artery are patent. Linear filling defect RIGHT subclavian artery distal to vertebral artery origin concerning for non flow-limiting dissection. Moderate luminal irregularity bilateral subclavian arteries compatible with atherosclerosis. RIGHT CAROTID SYSTEM: Common carotid artery is patent, mild luminal irregularity compatible with atherosclerosis. Moderate calcific atherosclerosis carotid bifurcation without hemodynamically significant stenosis by NASCET criteria. Normal appearance of the internal carotid artery. LEFT CAROTID SYSTEM: Common carotid artery is patent. Moderate calcific atherosclerosis LEFT carotid bifurcation. LEFT internal carotid artery is occluded within 9 mm of the origin with calcific capping and calcifications along the occluded course. No reconstitution in the neck. VERTEBRAL ARTERIES:RIGHT vertebral artery is dominant.  Severe stenosis RIGHT vertebral artery origin. Moderate luminal irregularity LEFT vertebral artery, vessel is smaller than the transverse foramen. Mild luminal irregularity RIGHT vertebral artery with calcific  atherosclerosis. SKELETON: No acute osseous process though bone windows have not been submitted. Status post C4-5 ACDF with arthrodesis. OTHER NECK: Soft tissues of the neck are nonacute though, not tailored for evaluation. UPPER CHEST: Included lung apices are clear. Centrilobular emphysema. No superior mediastinal lymphadenopathy. CTA HEAD FINDINGS: ANTERIOR CIRCULATION: Atherosclerosis resulting in severe stenosis versus focally occluded RIGHT petrous segment. Severe stenosis RIGHT carotid siphon due to calcific atherosclerosis. Faint reconstitution LEFT carotid terminus. Patent anterior and middle cerebral arteries, mild luminal irregularity compatible with atherosclerosis. No large vessel occlusion, contrast extravasation or aneurysm. POSTERIOR CIRCULATION: Patent vertebral arteries, vertebrobasilar junction and basilar artery, as well as main branch vessels. Moderate luminal irregularity RIGHT vertebral artery. Focal severe stenosis LEFT V4 segment due to atherosclerosis. Patent posterior cerebral arteries, mild luminal irregularity compatible with atherosclerosis. No large vessel occlusion, contrast extravasation or aneurysm. VENOUS SINUSES: Major dural venous sinuses are patent though not tailored for evaluation on this angiographic examination. ANATOMIC VARIANTS: None. DELAYED PHASE: No abnormal intracranial enhancement. MIP images reviewed. IMPRESSION: CTA NECK: 1. Chronically occluded LEFT ICA. No hemodynamically significant stenosis RIGHT ICA. 2. Luminal irregularity LEFT vertebral artery most compatible with old dissection without flow-limiting stenosis. 3. Luminal irregularity RIGHT vertebral artery seen with old dissection or atherosclerosis. Severe stenosis RIGHT vertebral artery origin. 4. Suspected RIGHT subclavian artery non flow-limiting dissection flap. No pseudoaneurysm. CTA HEAD: 1. No emergent large vessel occlusion.  Patent cerebral arteries. 2. Occluded LEFT ICA, reconstitution at carotid  terminus. 3. Severe stenosis versus focally occluded RIGHT petrous ICA, severe stenosis RIGHT carotid siphon. 4. Severe stenosis LEFT V4 segment. Emphysema (ICD10-J43.9).  Aortic Atherosclerosis (ICD10-I70.0). Electronically Signed   By: Elon Alas M.D.   On: 10/09/2017 01:48   Ct Angio Chest Pe W Or Wo Contrast  Result Date: 10/09/2017 CLINICAL DATA:  Shortness of breath and elevated D-dimer EXAM: CT ANGIOGRAPHY CHEST WITH CONTRAST TECHNIQUE: Multidetector CT imaging of the chest was performed using the standard protocol during bolus administration of intravenous contrast. Multiplanar CT image reconstructions and MIPs were obtained to evaluate the vascular anatomy. CONTRAST:  86mL ISOVUE-370 IOPAMIDOL (ISOVUE-370) INJECTION 76% COMPARISON:  CT angiogram chest April 07, 2017; chest radiograph October 08, 2017 prior chest CT January 02, 2014 FINDINGS: Cardiovascular: There is no demonstrable pulmonary embolus. There is no thoracic aortic aneurysm or dissection. Visualized great vessels show scattered areas of mild calcification without hemodynamically significant obstruction evident. There are foci of aortic atherosclerosis. There are multiple areas of coronary artery calcification. There is no pericardial effusion or pericardial thickening evident. Mediastinum/Nodes: Thyroid appears unremarkable. There are multiple subcentimeter mediastinal lymph nodes, stable. There is a right hilar lymph node measuring 1.0 x 1.0 cm. A second right hilar lymph node also measures 1 x 1 cm. No esophageal lesions are evident. Lungs/Pleura: There is atelectatic change in the right base. There is a nodular appearing opacity in the posterior segment of the right lower lobe measuring 1.0 x 0.9 cm which appears stable from the previous study and likely represents rounded atelectasis. No other nodular appearing opacity is evident. There is scarring in the right middle lobe which is similar to previous study. There is mild  lower lobe bronchiectatic change bilaterally. A cavitary lesion was present in this area in 2015. No pleural effusion or pleural thickening evident. Upper Abdomen: In the visualized upper abdomen, there is aortic  and proximal great vessel atherosclerosis. Adrenals again are noted to be a appearance of the adrenals is stable compared to prior study. Musculoskeletal: There is stable anterior wedging of the T5 vertebral body. Old healed rib fractures are noted on the left. There is a prior fracture of the left clavicle with remodeling at the fracture site. No acute bony lesion is evident. No blastic or lytic bone lesions are appreciable. No chest wall lesions are evident. Review of the MIP images confirms the above findings. IMPRESSION: 1. No demonstrable pulmonary embolus. No thoracic aortic aneurysm or dissection. There is aortic atherosclerosis. There are foci of great vessel and coronary artery calcification. 2. Slightly prominent right hilar lymph nodes of uncertain etiology. Scattered subcentimeter lymph nodes elsewhere present. 3. Areas of atelectatic change and mild lower lobe bronchiectatic change. Probable rounded atelectasis right base posteriorly. This area measures 1.0 x 0.9 cm, stable, and does have a somewhat nodular appearance. Given this appearance, a noncontrast enhanced CT in 6 months to assess for stability may be reasonable. 4.  Stable adrenal hypertrophy. 5. Multiple prior fractures. No acute appearing fracture. No blastic or lytic bone lesions evident. Aortic Atherosclerosis (ICD10-I70.0). Electronically Signed   By: Lowella Grip III M.D.   On: 10/09/2017 09:37   Mr Brain Wo Contrast  Result Date: 10/08/2017 CLINICAL DATA:  Generalized weakness, worse on the RIGHT. History of stroke, cervical spine injury, brain abscess, carotid artery stenosis. EXAM: MRI HEAD WITHOUT CONTRAST TECHNIQUE: Multiplanar, multiecho pulse sequences of the brain and surrounding structures were obtained without  intravenous contrast. COMPARISON:  CT HEAD October 08, 2017 and MRI head July 22, 2016 and MRI head December 01, 2013. FINDINGS: INTRACRANIAL CONTENTS: Patchy LEFT frontoparietal reduced diffusion with low ADC values. Bifrontal encephalomalacia with susceptibility artifact and areas of old infection. LEFT frontal/insular encephalomalacia. LEFT parietoccipital encephalomalacia. Old bilateral basal ganglia and thalami lacunar infarcts. Mild parenchymal brain volume loss for age. Patchy supratentorial and pontine white matter FLAIR T2 hyperintensities. No midline shift or mass effect. No abnormal extra-axial fluid collections. VASCULAR: Chronic loss of LEFT internal carotid artery flow void. SKULL AND UPPER CERVICAL SPINE: No abnormal sellar expansion. No suspicious calvarial bone marrow signal. Craniocervical junction maintained. SINUSES/ORBITS: The mastoid air-cells and included paranasal sinuses are well-aerated.The included ocular globes and orbital contents are non-suspicious. OTHER: None. IMPRESSION: 1. Acute small LEFT frontoparietal/MCA territory nonhemorrhagic infarcts. 2. Old LEFT MCA and posterior border zone territory infarcts. Old basal ganglia and thalami infarcts. 3. Bifrontal and RIGHT basal ganglia encephalomalacia at site of prior brain abscess. 4. Mild parenchymal brain volume loss. 5. Chronically occluded LEFT ICA. Electronically Signed   By: Elon Alas M.D.   On: 10/08/2017 23:54   Dg Chest Portable 1 View  Result Date: 09/11/2017 CLINICAL DATA:  57 year old male with shortness of breath. EXAM: PORTABLE CHEST 1 VIEW COMPARISON:  Chest CT dated 04/07/2017 FINDINGS: The lungs are clear. There is no pleural effusion or pneumothorax. The cardiac silhouette is within normal limits. No acute osseous pathology. IMPRESSION: No active disease. Electronically Signed   By: Anner Crete M.D.   On: 09/11/2017 23:35    DISCHARGE EXAMINATION: Vitals:   10/09/17 1748 10/09/17 2329 10/10/17  0337 10/10/17 0832  BP: 122/61 138/68 131/67 138/74  Pulse: 80 82 74 75  Resp: 20 18 18 18   Temp: 98.5 F (36.9 C) 98 F (36.7 C) 97.7 F (36.5 C) 98.2 F (36.8 C)  TempSrc:  Oral Oral Oral  SpO2: 100% 100% 100% 98%   General  appearance: alert, cooperative, appears stated age and no distress Resp: clear to auscultation bilaterally Cardio: regular rate and rhythm, S1, S2 normal, no murmur, click, rub or gallop GI: soft, non-tender; bowel sounds normal; no masses,  no organomegaly  DISPOSITION: Home  Discharge Instructions    Call MD for:  extreme fatigue   Complete by:  As directed    Call MD for:  persistant dizziness or light-headedness   Complete by:  As directed    Call MD for:  persistant nausea and vomiting   Complete by:  As directed    Call MD for:  severe uncontrolled pain   Complete by:  As directed    Call MD for:  temperature >100.4   Complete by:  As directed    Diet - low sodium heart healthy   Complete by:  As directed    Discharge instructions   Complete by:  As directed    Please be sure to follow-up with your primary care provider within 1 week.  Take your medications as prescribed.  You were cared for by a hospitalist during your hospital stay. If you have any questions about your discharge medications or the care you received while you were in the hospital after you are discharged, you can call the unit and asked to speak with the hospitalist on call if the hospitalist that took care of you is not available. Once you are discharged, your primary care physician will handle any further medical issues. Please note that NO REFILLS for any discharge medications will be authorized once you are discharged, as it is imperative that you return to your primary care physician (or establish a relationship with a primary care physician if you do not have one) for your aftercare needs so that they can reassess your need for medications and monitor your lab values. If you do  not have a primary care physician, you can call [phone] for a physician referral.   Increase activity slowly   Complete by:  As directed         Allergies as of 10/10/2017      Reactions   Buprenorphine Hcl-naloxone Hcl Shortness Of Breath, Other (See Comments)   "Felt like I was going to die," was  jittery, had trouble breathing, felt hot (reaction to Suboxone) PER THE NCCSRS database, the patient received #42 Suboxone films between 03/26/17 and 04/02/17 from [facility]...(??)   Ciprofloxacin Anaphylaxis   Morphine And Related Other (See Comments)   Seizures   Morphine-naltrexone Other (See Comments)   Seizures   Shellfish-derived Products Anaphylaxis, Hives, Swelling   Benadryl [diphenhydramine Hcl] Hives, Itching, Rash   Fish Allergy Hives, Swelling, Rash   Vicodin [hydrocodone-acetaminophen] Nausea Only      Medication List    STOP taking these medications   cetirizine-pseudoephedrine 5-120 MG tablet Commonly known as:  ZYRTEC-D   ranitidine 150 MG tablet Commonly known as:  ZANTAC     TAKE these medications   albuterol 108 (90 Base) MCG/ACT inhaler Commonly known as:  PROVENTIL HFA;VENTOLIN HFA Inhale 2 puffs into the lungs every 4 (four) hours as needed for wheezing or shortness of breath.   albuterol (2.5 MG/3ML) 0.083% nebulizer solution Commonly known as:  PROVENTIL Take 3 mLs (2.5 mg total) by nebulization every 6 (six) hours as needed for wheezing or shortness of breath.   aspirin EC 81 MG tablet Take 1 tablet (81 mg total) by mouth daily for 21 days.   atorvastatin 80 MG tablet Commonly known  as:  LIPITOR Take 1 tablet (80 mg total) by mouth daily at 6 PM.   clopidogrel 75 MG tablet Commonly known as:  PLAVIX Take 1 tablet (75 mg total) by mouth daily.   fluticasone 50 MCG/ACT nasal spray Commonly known as:  FLONASE Place 2 sprays into both nostrils daily. What changed:    when to take this  reasons to take this   hydrOXYzine 25  MG capsule Commonly known as:  VISTARIL Take 1 capsule (25 mg total) by mouth 2 (two) times daily. Itching   nicotine 21 mg/24hr patch Commonly known as:  NICODERM CQ - dosed in mg/24 hours Place 1 patch (21 mg total) onto the skin daily. Start taking on:  10/11/2017   ondansetron 4 MG tablet Commonly known as:  ZOFRAN Take 1 tablet (4 mg total) by mouth every 8 (eight) hours as needed for nausea or vomiting.   oxyCODONE-acetaminophen 10-325 MG tablet Commonly known as:  PERCOCET Take 1 tablet by mouth every 6 (six) hours as needed for pain.   pantoprazole 20 MG tablet Commonly known as:  PROTONIX Take 1 tablet (20 mg total) by mouth daily before breakfast.   pregabalin 75 MG capsule Commonly known as:  LYRICA TAKE 1 CAPSULE BY MOUTH THREE TIMES A DAY What changed:    how much to take  how to take this  when to take this  additional instructions        Follow-up Information    Fulp, Cammie, MD. Schedule an appointment as soon as possible for a visit in 1 week(s).   Specialty:  Family Medicine Contact information: [place] [address] [phone]           TOTAL DISCHARGE TIME: 35 minutes  Bonnielee Haff  [facility] Pager [phone]  10/10/2017, 1:38 PM

## 2017-10-13 ENCOUNTER — Telehealth: Payer: Self-pay

## 2017-10-13 LAB — CULTURE, BLOOD (ROUTINE X 2)
Culture: NO GROWTH
Culture: NO GROWTH
Special Requests: ADEQUATE
Special Requests: ADEQUATE

## 2017-10-13 NOTE — Telephone Encounter (Signed)
-----   Message from Antony Blackbird, MD sent at 10/13/2017 11:46 AM EDT ----- Final blood culture result shows no growth

## 2017-10-13 NOTE — Telephone Encounter (Signed)
Patient was called, verified dob, and was given ed blood culture note. Patient verbalized understanding and had no further questions.

## 2017-10-13 NOTE — Telephone Encounter (Signed)
Patient is aware of results.

## 2017-10-15 ENCOUNTER — Other Ambulatory Visit: Payer: Self-pay

## 2017-10-15 ENCOUNTER — Other Ambulatory Visit: Payer: Self-pay | Admitting: Family Medicine

## 2017-10-15 ENCOUNTER — Encounter: Payer: Self-pay | Admitting: Family Medicine

## 2017-10-15 ENCOUNTER — Ambulatory Visit: Payer: Medicaid Other | Attending: Family Medicine | Admitting: Family Medicine

## 2017-10-15 VITALS — BP 104/68 | HR 93 | Temp 98.7°F | Resp 28

## 2017-10-15 DIAGNOSIS — Z7902 Long term (current) use of antithrombotics/antiplatelets: Secondary | ICD-10-CM | POA: Insufficient documentation

## 2017-10-15 DIAGNOSIS — B1920 Unspecified viral hepatitis C without hepatic coma: Secondary | ICD-10-CM | POA: Diagnosis not present

## 2017-10-15 DIAGNOSIS — Z95810 Presence of automatic (implantable) cardiac defibrillator: Secondary | ICD-10-CM | POA: Insufficient documentation

## 2017-10-15 DIAGNOSIS — Z86711 Personal history of pulmonary embolism: Secondary | ICD-10-CM | POA: Diagnosis not present

## 2017-10-15 DIAGNOSIS — I10 Essential (primary) hypertension: Secondary | ICD-10-CM | POA: Diagnosis not present

## 2017-10-15 DIAGNOSIS — Z7901 Long term (current) use of anticoagulants: Secondary | ICD-10-CM | POA: Diagnosis not present

## 2017-10-15 DIAGNOSIS — Z881 Allergy status to other antibiotic agents status: Secondary | ICD-10-CM | POA: Diagnosis not present

## 2017-10-15 DIAGNOSIS — I110 Hypertensive heart disease with heart failure: Secondary | ICD-10-CM | POA: Diagnosis not present

## 2017-10-15 DIAGNOSIS — J449 Chronic obstructive pulmonary disease, unspecified: Secondary | ICD-10-CM

## 2017-10-15 DIAGNOSIS — Z888 Allergy status to other drugs, medicaments and biological substances status: Secondary | ICD-10-CM | POA: Diagnosis not present

## 2017-10-15 DIAGNOSIS — F17210 Nicotine dependence, cigarettes, uncomplicated: Secondary | ICD-10-CM | POA: Insufficient documentation

## 2017-10-15 DIAGNOSIS — K219 Gastro-esophageal reflux disease without esophagitis: Secondary | ICD-10-CM | POA: Diagnosis not present

## 2017-10-15 DIAGNOSIS — E785 Hyperlipidemia, unspecified: Secondary | ICD-10-CM | POA: Diagnosis not present

## 2017-10-15 DIAGNOSIS — Z9114 Patient's other noncompliance with medication regimen: Secondary | ICD-10-CM | POA: Diagnosis not present

## 2017-10-15 DIAGNOSIS — M069 Rheumatoid arthritis, unspecified: Secondary | ICD-10-CM | POA: Diagnosis not present

## 2017-10-15 DIAGNOSIS — I509 Heart failure, unspecified: Secondary | ICD-10-CM | POA: Diagnosis not present

## 2017-10-15 DIAGNOSIS — E1151 Type 2 diabetes mellitus with diabetic peripheral angiopathy without gangrene: Secondary | ICD-10-CM | POA: Diagnosis not present

## 2017-10-15 DIAGNOSIS — Z87442 Personal history of urinary calculi: Secondary | ICD-10-CM | POA: Diagnosis not present

## 2017-10-15 DIAGNOSIS — I639 Cerebral infarction, unspecified: Secondary | ICD-10-CM | POA: Diagnosis not present

## 2017-10-15 DIAGNOSIS — G4730 Sleep apnea, unspecified: Secondary | ICD-10-CM | POA: Insufficient documentation

## 2017-10-15 DIAGNOSIS — G894 Chronic pain syndrome: Secondary | ICD-10-CM | POA: Diagnosis not present

## 2017-10-15 DIAGNOSIS — R110 Nausea: Secondary | ICD-10-CM

## 2017-10-15 DIAGNOSIS — Z885 Allergy status to narcotic agent status: Secondary | ICD-10-CM | POA: Insufficient documentation

## 2017-10-15 DIAGNOSIS — F329 Major depressive disorder, single episode, unspecified: Secondary | ICD-10-CM | POA: Insufficient documentation

## 2017-10-15 DIAGNOSIS — Z7982 Long term (current) use of aspirin: Secondary | ICD-10-CM | POA: Diagnosis not present

## 2017-10-15 DIAGNOSIS — K449 Diaphragmatic hernia without obstruction or gangrene: Secondary | ICD-10-CM | POA: Insufficient documentation

## 2017-10-15 DIAGNOSIS — I739 Peripheral vascular disease, unspecified: Secondary | ICD-10-CM

## 2017-10-15 MED ORDER — ONDANSETRON 4 MG PO TBDP
4.0000 mg | ORAL_TABLET | Freq: Once | ORAL | Status: AC
  Administered 2017-10-15: 4 mg via ORAL

## 2017-10-15 MED ORDER — ATORVASTATIN CALCIUM 80 MG PO TABS: 80.0000 mg | ORAL_TABLET | Freq: Every day | ORAL | 3 refills | Status: DC

## 2017-10-15 MED ORDER — IPRATROPIUM-ALBUTEROL 0.5-2.5 (3) MG/3ML IN SOLN
3.0000 mL | Freq: Once | RESPIRATORY_TRACT | Status: AC
  Administered 2017-10-15: 3 mL via RESPIRATORY_TRACT

## 2017-10-15 MED ORDER — PANTOPRAZOLE SODIUM 20 MG PO TBEC: 20.0000 mg | DELAYED_RELEASE_TABLET | Freq: Every day | ORAL | 1 refills | Status: DC

## 2017-10-15 MED ORDER — ALBUTEROL SULFATE HFA 108 (90 BASE) MCG/ACT IN AERS: 2.0000 | INHALATION_SPRAY | RESPIRATORY_TRACT | 1 refills | Status: DC | PRN

## 2017-10-15 MED ORDER — ASPIRIN EC 81 MG PO TBEC: 81.0000 mg | DELAYED_RELEASE_TABLET | Freq: Every day | ORAL | 0 refills | Status: DC

## 2017-10-15 MED ORDER — CLOPIDOGREL BISULFATE 75 MG PO TABS: 75.0000 mg | ORAL_TABLET | Freq: Every day | ORAL | 3 refills | Status: DC

## 2017-10-15 MED FILL — CLOPIDOGREL 75 MG TABLET: 75 | 30 days supply | Qty: 30 | Fill #0

## 2017-10-15 MED FILL — ATORVASTATIN 80 MG TABLET: 80 | 30 days supply | Qty: 30 | Fill #0
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
## 2017-10-18 ENCOUNTER — Encounter (HOSPITAL_COMMUNITY): Payer: Self-pay | Admitting: Emergency Medicine

## 2017-10-18 ENCOUNTER — Inpatient Hospital Stay (HOSPITAL_COMMUNITY): Payer: Medicaid Other

## 2017-10-18 ENCOUNTER — Inpatient Hospital Stay (HOSPITAL_COMMUNITY)
Admission: EM | Admit: 2017-10-18 | Discharge: 2017-10-27 | DRG: 263 | Disposition: A | Discharge status: Home Health | Payer: Medicaid Other | Attending: Vascular Surgery | Admitting: Vascular Surgery

## 2017-10-18 ENCOUNTER — Other Ambulatory Visit: Payer: Self-pay

## 2017-10-18 DIAGNOSIS — K219 Gastro-esophageal reflux disease without esophagitis: Secondary | ICD-10-CM | POA: Diagnosis present

## 2017-10-18 DIAGNOSIS — G894 Chronic pain syndrome: Secondary | ICD-10-CM | POA: Diagnosis present

## 2017-10-18 DIAGNOSIS — Z95810 Presence of automatic (implantable) cardiac defibrillator: Secondary | ICD-10-CM

## 2017-10-18 DIAGNOSIS — E1151 Type 2 diabetes mellitus with diabetic peripheral angiopathy without gangrene: Secondary | ICD-10-CM | POA: Diagnosis present

## 2017-10-18 DIAGNOSIS — Z981 Arthrodesis status: Secondary | ICD-10-CM

## 2017-10-18 DIAGNOSIS — Z881 Allergy status to other antibiotic agents status: Secondary | ICD-10-CM

## 2017-10-18 DIAGNOSIS — G4733 Obstructive sleep apnea (adult) (pediatric): Secondary | ICD-10-CM | POA: Diagnosis present

## 2017-10-18 DIAGNOSIS — B1920 Unspecified viral hepatitis C without hepatic coma: Secondary | ICD-10-CM | POA: Diagnosis present

## 2017-10-18 DIAGNOSIS — T827XXA Infection and inflammatory reaction due to other cardiac and vascular devices, implants and grafts, initial encounter: Principal | ICD-10-CM | POA: Diagnosis present

## 2017-10-18 DIAGNOSIS — Z8249 Family history of ischemic heart disease and other diseases of the circulatory system: Secondary | ICD-10-CM | POA: Diagnosis not present

## 2017-10-18 DIAGNOSIS — I110 Hypertensive heart disease with heart failure: Secondary | ICD-10-CM | POA: Diagnosis present

## 2017-10-18 DIAGNOSIS — J449 Chronic obstructive pulmonary disease, unspecified: Secondary | ICD-10-CM | POA: Diagnosis present

## 2017-10-18 DIAGNOSIS — Z885 Allergy status to narcotic agent status: Secondary | ICD-10-CM

## 2017-10-18 DIAGNOSIS — E785 Hyperlipidemia, unspecified: Secondary | ICD-10-CM | POA: Diagnosis present

## 2017-10-18 DIAGNOSIS — M069 Rheumatoid arthritis, unspecified: Secondary | ICD-10-CM | POA: Diagnosis present

## 2017-10-18 DIAGNOSIS — Z01810 Encounter for preprocedural cardiovascular examination: Secondary | ICD-10-CM | POA: Diagnosis not present

## 2017-10-18 DIAGNOSIS — Z86711 Personal history of pulmonary embolism: Secondary | ICD-10-CM | POA: Diagnosis not present

## 2017-10-18 DIAGNOSIS — Z96662 Presence of left artificial ankle joint: Secondary | ICD-10-CM | POA: Diagnosis present

## 2017-10-18 DIAGNOSIS — F1120 Opioid dependence, uncomplicated: Secondary | ICD-10-CM | POA: Diagnosis present

## 2017-10-18 DIAGNOSIS — I509 Heart failure, unspecified: Secondary | ICD-10-CM | POA: Diagnosis present

## 2017-10-18 DIAGNOSIS — B999 Unspecified infectious disease: Secondary | ICD-10-CM | POA: Diagnosis present

## 2017-10-18 DIAGNOSIS — Z8673 Personal history of transient ischemic attack (TIA), and cerebral infarction without residual deficits: Secondary | ICD-10-CM | POA: Diagnosis not present

## 2017-10-18 DIAGNOSIS — L02415 Cutaneous abscess of right lower limb: Secondary | ICD-10-CM

## 2017-10-18 DIAGNOSIS — Z7902 Long term (current) use of antithrombotics/antiplatelets: Secondary | ICD-10-CM | POA: Diagnosis not present

## 2017-10-18 DIAGNOSIS — Z808 Family history of malignant neoplasm of other organs or systems: Secondary | ICD-10-CM

## 2017-10-18 DIAGNOSIS — T82898A Other specified complication of vascular prosthetic devices, implants and grafts, initial encounter: Secondary | ICD-10-CM | POA: Diagnosis not present

## 2017-10-18 DIAGNOSIS — Z888 Allergy status to other drugs, medicaments and biological substances status: Secondary | ICD-10-CM

## 2017-10-18 DIAGNOSIS — Z7982 Long term (current) use of aspirin: Secondary | ICD-10-CM

## 2017-10-18 DIAGNOSIS — F17210 Nicotine dependence, cigarettes, uncomplicated: Secondary | ICD-10-CM | POA: Diagnosis present

## 2017-10-18 DIAGNOSIS — Z9889 Other specified postprocedural states: Secondary | ICD-10-CM

## 2017-10-18 DIAGNOSIS — Z800 Family history of malignant neoplasm of digestive organs: Secondary | ICD-10-CM | POA: Diagnosis not present

## 2017-10-18 DIAGNOSIS — Z7951 Long term (current) use of inhaled steroids: Secondary | ICD-10-CM

## 2017-10-18 DIAGNOSIS — Z87442 Personal history of urinary calculi: Secondary | ICD-10-CM | POA: Diagnosis not present

## 2017-10-18 DIAGNOSIS — K449 Diaphragmatic hernia without obstruction or gangrene: Secondary | ICD-10-CM | POA: Diagnosis present

## 2017-10-18 DIAGNOSIS — Z91013 Allergy to seafood: Secondary | ICD-10-CM

## 2017-10-18 LAB — CBC WITH DIFFERENTIAL/PLATELET
ABS IMMATURE GRANULOCYTES: 0 10*3/uL (ref 0.0–0.1)
BASOS ABS: 0.1 10*3/uL (ref 0.0–0.1)
BASOS PCT: 1 %
EOS ABS: 0.2 10*3/uL (ref 0.0–0.7)
Eosinophils Relative: 2 %
HCT: 35.4 % — ABNORMAL LOW (ref 39.0–52.0)
Hemoglobin: 10.7 g/dL — ABNORMAL LOW (ref 13.0–17.0)
Immature Granulocytes: 0 %
Lymphocytes Relative: 33 %
Lymphs Abs: 3.2 10*3/uL (ref 0.7–4.0)
MCH: 22.4 pg — AB (ref 26.0–34.0)
MCHC: 30.2 g/dL (ref 30.0–36.0)
MCV: 74.1 fL — AB (ref 78.0–100.0)
MONOS PCT: 9 %
Monocytes Absolute: 0.9 10*3/uL (ref 0.1–1.0)
NEUTROS ABS: 5.1 10*3/uL (ref 1.7–7.7)
Neutrophils Relative %: 55 %
PLATELETS: 330 10*3/uL (ref 150–400)
RBC: 4.78 MIL/uL (ref 4.22–5.81)
RDW: 19.5 % — AB (ref 11.5–15.5)
WBC: 9.5 10*3/uL (ref 4.0–10.5)

## 2017-10-18 LAB — BASIC METABOLIC PANEL
ANION GAP: 9 (ref 5–15)
BUN: 35 mg/dL — ABNORMAL HIGH (ref 6–20)
CALCIUM: 9.1 mg/dL (ref 8.9–10.3)
CO2: 23 mmol/L (ref 22–32)
CREATININE: 1.11 mg/dL (ref 0.61–1.24)
Chloride: 100 mmol/L (ref 98–111)
GFR calc Af Amer: >60 mL/min (ref >60)
GLUCOSE: 112 mg/dL — AB (ref 70–99)
Potassium: 4.7 mmol/L (ref 3.5–5.1)
Sodium: 132 mmol/L — ABNORMAL LOW (ref 135–145)

## 2017-10-18 MED ORDER — SODIUM CHLORIDE 0.9 % IV BOLUS
1000.0000 mL | Freq: Once | INTRAVENOUS | Status: AC
  Administered 2017-10-18: 1000 mL via INTRAVENOUS

## 2017-10-18 MED ORDER — ONDANSETRON HCL 4 MG/2ML IJ SOLN
4.0000 mg | Freq: Four times a day (QID) | INTRAMUSCULAR | Status: DC | PRN
  Administered 2017-10-18 – 2017-10-26 (×11): 4 mg via INTRAVENOUS
  Filled 2017-10-18 (×11): qty 2

## 2017-10-18 MED ORDER — ENOXAPARIN SODIUM 40 MG/0.4ML ~~LOC~~ SOLN
40.0000 mg | SUBCUTANEOUS | Status: DC
  Administered 2017-10-18 – 2017-10-21 (×4): 40 mg via SUBCUTANEOUS
  Filled 2017-10-18 (×4): qty 0.4

## 2017-10-18 MED ORDER — PANTOPRAZOLE SODIUM 20 MG PO TBEC: 20.0000 mg | DELAYED_RELEASE_TABLET | Freq: Every day | ORAL | Status: DC

## 2017-10-18 MED ORDER — OXYCODONE-ACETAMINOPHEN 10-325 MG PO TABS: 1.0000 | ORAL_TABLET | Freq: Four times a day (QID) | ORAL | Status: DC | PRN

## 2017-10-18 MED ORDER — POTASSIUM CHLORIDE CRYS ER 20 MEQ PO TBCR: 20.0000 meq | EXTENDED_RELEASE_TABLET | Freq: Once | ORAL | Status: DC

## 2017-10-18 MED ORDER — CLOPIDOGREL BISULFATE 75 MG PO TABS
75.0000 mg | ORAL_TABLET | Freq: Every day | ORAL | Status: DC
  Administered 2017-10-18 – 2017-10-21 (×3): 75 mg via ORAL
  Filled 2017-10-18 (×3): qty 1

## 2017-10-18 MED ORDER — ASPIRIN EC 81 MG PO TBEC
81.0000 mg | DELAYED_RELEASE_TABLET | Freq: Every day | ORAL | Status: DC
  Administered 2017-10-18 – 2017-10-27 (×10): 81 mg via ORAL
  Filled 2017-10-18 (×10): qty 1

## 2017-10-18 MED ORDER — HYDROMORPHONE HCL 1 MG/ML IJ SOLN
1.0000 mg | Freq: Once | INTRAMUSCULAR | Status: AC
  Administered 2017-10-18: 1 mg via INTRAVENOUS
  Filled 2017-10-18: qty 1

## 2017-10-18 MED ORDER — KCL IN DEXTROSE-NACL 20-5-0.45 MEQ/L-%-% IV SOLN
INTRAVENOUS | Status: DC
  Administered 2017-10-18 – 2017-10-21 (×3): via INTRAVENOUS
  Filled 2017-10-18 (×5): qty 1000

## 2017-10-18 MED ORDER — HYDRALAZINE HCL 20 MG/ML IJ SOLN: 5.0000 mg | INTRAMUSCULAR | Status: DC | PRN

## 2017-10-18 MED ORDER — PANTOPRAZOLE SODIUM 40 MG PO TBEC
40.0000 mg | DELAYED_RELEASE_TABLET | Freq: Every day | ORAL | Status: DC
  Administered 2017-10-18 – 2017-10-27 (×10): 40 mg via ORAL
  Filled 2017-10-18 (×10): qty 1

## 2017-10-18 MED ORDER — OXYCODONE-ACETAMINOPHEN 5-325 MG PO TABS
1.0000 | ORAL_TABLET | Freq: Four times a day (QID) | ORAL | Status: DC | PRN
  Administered 2017-10-18: 1 via ORAL
  Administered 2017-10-18: 2 via ORAL
  Administered 2017-10-18 – 2017-10-19 (×2): 1 via ORAL
  Administered 2017-10-19: 2 via ORAL
  Administered 2017-10-19: 1 via ORAL
  Administered 2017-10-19 – 2017-10-22 (×11): 2 via ORAL
  Filled 2017-10-18 (×6): qty 2
  Filled 2017-10-18: qty 1
  Filled 2017-10-18: qty 2
  Filled 2017-10-18: qty 1
  Filled 2017-10-18: qty 2
  Filled 2017-10-18: qty 1
  Filled 2017-10-18 (×5): qty 2
  Filled 2017-10-18: qty 1

## 2017-10-18 MED ORDER — ALUM & MAG HYDROXIDE-SIMETH 200-200-20 MG/5ML PO SUSP: 15.0000 mL | ORAL | Status: DC | PRN

## 2017-10-18 MED ORDER — METOPROLOL TARTRATE 5 MG/5ML IV SOLN
2.0000 mg | INTRAVENOUS | Status: AC | PRN
  Administered 2017-10-22: 2 mg via INTRAVENOUS
  Administered 2017-10-25: 5 mg via INTRAVENOUS
  Filled 2017-10-18 (×2): qty 5

## 2017-10-18 MED ORDER — LABETALOL HCL 5 MG/ML IV SOLN
10.0000 mg | INTRAVENOUS | Status: DC | PRN
  Administered 2017-10-26: 10 mg via INTRAVENOUS
  Filled 2017-10-18: qty 4

## 2017-10-18 MED ORDER — OXYCODONE HCL 5 MG PO TABS
5.0000 mg | ORAL_TABLET | Freq: Four times a day (QID) | ORAL | Status: DC | PRN
  Administered 2017-10-18: 10 mg via ORAL
  Administered 2017-10-18: 5 mg via ORAL
  Administered 2017-10-18: 10 mg via ORAL
  Administered 2017-10-18: 5 mg via ORAL
  Administered 2017-10-19: 10 mg via ORAL
  Administered 2017-10-19 (×2): 5 mg via ORAL
  Administered 2017-10-19 – 2017-10-20 (×4): 10 mg via ORAL
  Administered 2017-10-20: 5 mg via ORAL
  Administered 2017-10-21 – 2017-10-23 (×7): 10 mg via ORAL
  Filled 2017-10-18: qty 2
  Filled 2017-10-18: qty 1
  Filled 2017-10-18 (×2): qty 2
  Filled 2017-10-18: qty 1
  Filled 2017-10-18: qty 2
  Filled 2017-10-18: qty 1
  Filled 2017-10-18 (×2): qty 2
  Filled 2017-10-18: qty 1
  Filled 2017-10-18 (×2): qty 2
  Filled 2017-10-18: qty 1
  Filled 2017-10-18 (×6): qty 2

## 2017-10-18 MED ORDER — GUAIFENESIN-DM 100-10 MG/5ML PO SYRP: 15.0000 mL | ORAL_SOLUTION | ORAL | Status: DC | PRN

## 2017-10-18 MED ORDER — PHENOL 1.4 % MT LIQD
1.0000 | OROMUCOSAL | Status: DC | PRN
  Filled 2017-10-18: qty 177

## 2017-10-18 MED ORDER — ATORVASTATIN CALCIUM 80 MG PO TABS
80.0000 mg | ORAL_TABLET | Freq: Every day | ORAL | Status: DC
  Administered 2017-10-18 – 2017-10-26 (×7): 80 mg via ORAL
  Filled 2017-10-18 (×9): qty 1

## 2017-10-18 MED ORDER — ONDANSETRON HCL 4 MG/2ML IJ SOLN
4.0000 mg | Freq: Once | INTRAMUSCULAR | Status: AC
  Administered 2017-10-18: 4 mg via INTRAVENOUS
  Filled 2017-10-18: qty 2

## 2017-10-18 MED ORDER — ALBUTEROL SULFATE (2.5 MG/3ML) 0.083% IN NEBU: 3.0000 mL | INHALATION_SOLUTION | RESPIRATORY_TRACT | Status: DC | PRN

## 2017-10-18 MED ORDER — NICOTINE 21 MG/24HR TD PT24
21.0000 mg | MEDICATED_PATCH | Freq: Every day | TRANSDERMAL | Status: DC
  Administered 2017-10-18 – 2017-10-27 (×10): 21 mg via TRANSDERMAL
  Filled 2017-10-18 (×11): qty 1

## 2017-10-18 NOTE — ED Notes (Signed)
Ordered breakfast tray  
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
## 2017-10-18 NOTE — ED Notes (Signed)
Dr. Dickson at bedside.
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
## 2017-10-18 NOTE — Progress Notes (Signed)
Patient received from ED, A&Ox4, VSS. Telemetry applied and CCMD notified.  

## 2017-10-18 NOTE — ED Triage Notes (Signed)
Patient with abscess on right knee, patient has admitted to shooting heroin into that knee.  Knee is swollen, red and warm to the touch.  Patient is able to ambulate.  This is third time that this has happened and was told the last time that he may have to have it amputated.

## 2017-10-18 NOTE — Progress Notes (Signed)
VASCULAR LAB PRELIMINARY  PRELIMINARY  PRELIMINARY  PRELIMINARY  Right arterial duplex completed.    Preliminary report:  Patent femoral-anterior tibial graft.  Velocities diminish and become dampened monophasic at the knee but return to near normal in the proximal calf.  Massiel Stipp, RVT 10/18/2017, 12:31 PM

## 2017-10-19 ENCOUNTER — Other Ambulatory Visit: Payer: Self-pay

## 2017-10-19 LAB — CBC WITH DIFFERENTIAL/PLATELET

## 2017-10-19 LAB — COMPREHENSIVE METABOLIC PANEL WITH GFR
ALT: 21 IU/L (ref 0–44)
AST: 23 IU/L (ref 0–40)
Albumin/Globulin Ratio: 0.7 — ABNORMAL LOW (ref 1.2–2.2)
Albumin: 3.6 g/dL (ref 3.5–5.5)
Alkaline Phosphatase: 108 IU/L (ref 39–117)
BUN/Creatinine Ratio: 20 (ref 9–20)
BUN: 17 mg/dL (ref 6–24)
Bilirubin Total: 0.4 mg/dL (ref 0.0–1.2)
CO2: 21 mmol/L (ref 20–29)
Calcium: 9.2 mg/dL (ref 8.7–10.2)
Chloride: 94 mmol/L — ABNORMAL LOW (ref 96–106)
Creatinine, Ser: 0.87 mg/dL (ref 0.76–1.27)
GFR calc Af Amer: 109 mL/min/1.73
GFR calc non Af Amer: 94 mL/min/1.73
Globulin, Total: 5.2 g/dL — ABNORMAL HIGH (ref 1.5–4.5)
Glucose: 113 mg/dL — ABNORMAL HIGH (ref 65–99)
Potassium: 4.9 mmol/L (ref 3.5–5.2)
Sodium: 132 mmol/L — ABNORMAL LOW (ref 134–144)
Total Protein: 8.8 g/dL — ABNORMAL HIGH (ref 6.0–8.5)

## 2017-10-19 MED ORDER — CEFAZOLIN SODIUM-DEXTROSE 1-4 GM/50ML-% IV SOLN
1.0000 g | Freq: Four times a day (QID) | INTRAVENOUS | Status: DC
  Administered 2017-10-19 – 2017-10-23 (×16): 1 g via INTRAVENOUS
  Filled 2017-10-19 (×18): qty 50

## 2017-10-19 MED ORDER — HYDROMORPHONE HCL 1 MG/ML IJ SOLN
1.0000 mg | Freq: Once | INTRAMUSCULAR | Status: AC
  Administered 2017-10-19: 1 mg via INTRAVENOUS
  Filled 2017-10-19: qty 1

## 2017-10-19 NOTE — Progress Notes (Signed)
Subjective: Interval History: none.. Remains afebrile.  States that he is through with drugs this time.  Objective: Vital signs in last 24 hours: Temp:  [97.6 F (36.4 C)-97.7 F (36.5 C)] 97.7 F (36.5 C) (10/08 0438) Pulse Rate:  [72-78] 72 (10/08 0438) Resp:  [14-18] 14 (10/08 0438) BP: (117-140)/(64-76) 135/73 (10/08 0438) SpO2:  [98 %-100 %] 98 % (10/08 0438)  Intake/Output from previous day: 10/07 0701 - 10/08 0700 In: -  Out: 1800 [Urine:1800] Intake/Output this shift: No intake/output data recorded.  Right femoral to anterior tibial graft patent.  Induration lateral to the knee.  No fluctuance.  Lab Results: Recent Labs    10/18/17 0306  WBC 9.5  HGB 10.7*  HCT 35.4*  PLT 330   BMET Recent Labs    10/18/17 0306  NA 132*  K 4.7  CL 100  CO2 23  GLUCOSE 112*  BUN 35*  CREATININE 1.11  CALCIUM 9.1    Studies/Results: Ct Angio Head W Or Wo Contrast  Result Date: 10/09/2017 CLINICAL DATA:  Follow up stroke. History of stroke, brain abscess, LEFT ICA occlusion. EXAM: CT ANGIOGRAPHY HEAD AND NECK TECHNIQUE: Multidetector CT imaging of the head and neck was performed using the standard protocol during bolus administration of intravenous contrast. Multiplanar CT image reconstructions and MIPs were obtained to evaluate the vascular anatomy. Carotid stenosis measurements (when applicable) are obtained utilizing NASCET criteria, using the distal internal carotid diameter as the denominator. CONTRAST:  60mL ISOVUE-370 IOPAMIDOL (ISOVUE-370) INJECTION 76% COMPARISON:  MRI head October 08, 2017 and [facility] neuro angiogram May 23, 2013. FINDINGS: CTA NECK FINDINGS: AORTIC ARCH: Normal appearance of the thoracic arch, normal branch pattern. Mild calcific atherosclerosis and intimal thickening aortic arch. The origins of the innominate, left Common carotid artery and subclavian artery are patent. Linear filling defect RIGHT subclavian artery distal to vertebral artery origin  concerning for non flow-limiting dissection. Moderate luminal irregularity bilateral subclavian arteries compatible with atherosclerosis. RIGHT CAROTID SYSTEM: Common carotid artery is patent, mild luminal irregularity compatible with atherosclerosis. Moderate calcific atherosclerosis carotid bifurcation without hemodynamically significant stenosis by NASCET criteria. Normal appearance of the internal carotid artery. LEFT CAROTID SYSTEM: Common carotid artery is patent. Moderate calcific atherosclerosis LEFT carotid bifurcation. LEFT internal carotid artery is occluded within 9 mm of the origin with calcific capping and calcifications along the occluded course. No reconstitution in the neck. VERTEBRAL ARTERIES:RIGHT vertebral artery is dominant. Severe stenosis RIGHT vertebral artery origin. Moderate luminal irregularity LEFT vertebral artery, vessel is smaller than the transverse foramen. Mild luminal irregularity RIGHT vertebral artery with calcific atherosclerosis. SKELETON: No acute osseous process though bone windows have not been submitted. Status post C4-5 ACDF with arthrodesis. OTHER NECK: Soft tissues of the neck are nonacute though, not tailored for evaluation. UPPER CHEST: Included lung apices are clear. Centrilobular emphysema. No superior mediastinal lymphadenopathy. CTA HEAD FINDINGS: ANTERIOR CIRCULATION: Atherosclerosis resulting in severe stenosis versus focally occluded RIGHT petrous segment. Severe stenosis RIGHT carotid siphon due to calcific atherosclerosis. Faint reconstitution LEFT carotid terminus. Patent anterior and middle cerebral arteries, mild luminal irregularity compatible with atherosclerosis. No large vessel occlusion, contrast extravasation or aneurysm. POSTERIOR CIRCULATION: Patent vertebral arteries, vertebrobasilar junction and basilar artery, as well as main branch vessels. Moderate luminal irregularity RIGHT vertebral artery. Focal severe stenosis LEFT V4 segment due to  atherosclerosis. Patent posterior cerebral arteries, mild luminal irregularity compatible with atherosclerosis. No large vessel occlusion, contrast extravasation or aneurysm. VENOUS SINUSES: Major dural venous sinuses are patent though not tailored for evaluation  on this angiographic examination. ANATOMIC VARIANTS: None. DELAYED PHASE: No abnormal intracranial enhancement. MIP images reviewed. IMPRESSION: CTA NECK: 1. Chronically occluded LEFT ICA. No hemodynamically significant stenosis RIGHT ICA. 2. Luminal irregularity LEFT vertebral artery most compatible with old dissection without flow-limiting stenosis. 3. Luminal irregularity RIGHT vertebral artery seen with old dissection or atherosclerosis. Severe stenosis RIGHT vertebral artery origin. 4. Suspected RIGHT subclavian artery non flow-limiting dissection flap. No pseudoaneurysm. CTA HEAD: 1. No emergent large vessel occlusion.  Patent cerebral arteries. 2. Occluded LEFT ICA, reconstitution at carotid terminus. 3. Severe stenosis versus focally occluded RIGHT petrous ICA, severe stenosis RIGHT carotid siphon. 4. Severe stenosis LEFT V4 segment. Emphysema (ICD10-J43.9).  Aortic Atherosclerosis (ICD10-I70.0). Electronically Signed   By: Elon Alas M.D.   On: 10/09/2017 01:48   Dg Chest 2 View  Result Date: 10/08/2017 CLINICAL DATA:  Generalized fatigue, weakness EXAM: CHEST - 2 VIEW COMPARISON:  06/16/2016, 06/19/2016, 02/24/2017 FINDINGS: There is a 9 mm nodular opacity of the right lower lobe likely reflecting a nipple shadow. There is no focal consolidation. There is no pleural effusion or pneumothorax. The heart and mediastinal contours are unremarkable. The osseous structures are unremarkable. IMPRESSION: 1. No acute cardiopulmonary disease. 2. 9 mm nodular opacity in the right lower lobe likely reflecting a nipple shadow. Recommend repeat chest x-ray with nipple markers. Electronically Signed   By: Kathreen Devoid   On: 10/08/2017 20:13   Dg  Chest 2 View  Result Date: 09/27/2017 CLINICAL DATA:  Weakness.  Hypertension. EXAM: CHEST - 2 VIEW COMPARISON:  September 11, 2017 FINDINGS: There is mild scarring in the left upper lobe. There is no edema or consolidation. The heart size and pulmonary vascularity are normal. No adenopathy. There is aortic atherosclerosis. There is evidence of an old healed fracture of the left clavicle as well as several old healed rib fractures on the left. There is postoperative change in the lower cervical region. IMPRESSION: No edema or consolidation. Mild scarring left upper lobe. Heart size normal. There is aortic atherosclerosis. There is evidence of old bony trauma on the left. Aortic Atherosclerosis (ICD10-I70.0). Electronically Signed   By: Lowella Grip III M.D.   On: 09/27/2017 08:52   Ct Head Wo Contrast  Result Date: 10/08/2017 CLINICAL DATA:  Generalized fatigue and weakness with body aches EXAM: CT HEAD WITHOUT CONTRAST TECHNIQUE: Contiguous axial images were obtained from the base of the skull through the vertex without intravenous contrast. COMPARISON:  09/27/2017 FINDINGS: Brain: Chronic stable involutional changes of the brain. Subcortical areas of hypoattenuation in the bifrontal and high parietal lobes compatible with known areas of remote infarct and/or hemorrhage. No acute intracranial hemorrhage is currently identified. Hydrocephalus extra-axial fluid collections. Vascular: Atherosclerosis at the skull base. Skull: No acute skull fracture. Burr holes noted bilaterally as before. Sinuses/Orbits: No acute finding. Other: None. IMPRESSION: No acute findings.  Multiple infarcts as before Electronically Signed   By: Ashley Royalty M.D.   On: 10/08/2017 20:10   Ct Head Wo Contrast  Result Date: 09/27/2017 CLINICAL DATA:  Subacute neuro deficit.  Episodic peripheral vertigo EXAM: CT HEAD WITHOUT CONTRAST TECHNIQUE: Contiguous axial images were obtained from the base of the skull through the vertex without  intravenous contrast. COMPARISON:  07/22/2014 FINDINGS: Brain: Multiple remote infarcts seen along the left more than right cerebral convexity. There is low-density in the subcortical high posterior left frontal region where there was blood products on prior scan. Blood products in the right centrum semiovale have also faded. The largest  remote infarct is along the left frontal operculum. No evidence of acute infarct. No hemorrhage, hydrocephalus, or collection. Vascular: Atherosclerotic calcification.  No hyperdense vessel Skull: Chronic bilateral burr holes.  No acute finding Sinuses/Orbits: No acute finding IMPRESSION: 1. No acute finding when compared to priors. 2. Multiple remote infarcts. Electronically Signed   By: Monte Fantasia M.D.   On: 09/27/2017 09:30   Ct Angio Neck W Or Wo Contrast  Result Date: 10/09/2017 CLINICAL DATA:  Follow up stroke. History of stroke, brain abscess, LEFT ICA occlusion. EXAM: CT ANGIOGRAPHY HEAD AND NECK TECHNIQUE: Multidetector CT imaging of the head and neck was performed using the standard protocol during bolus administration of intravenous contrast. Multiplanar CT image reconstructions and MIPs were obtained to evaluate the vascular anatomy. Carotid stenosis measurements (when applicable) are obtained utilizing NASCET criteria, using the distal internal carotid diameter as the denominator. CONTRAST:  56mL ISOVUE-370 IOPAMIDOL (ISOVUE-370) INJECTION 76% COMPARISON:  MRI head October 08, 2017 and [facility] neuro angiogram May 23, 2013. FINDINGS: CTA NECK FINDINGS: AORTIC ARCH: Normal appearance of the thoracic arch, normal branch pattern. Mild calcific atherosclerosis and intimal thickening aortic arch. The origins of the innominate, left Common carotid artery and subclavian artery are patent. Linear filling defect RIGHT subclavian artery distal to vertebral artery origin concerning for non flow-limiting dissection. Moderate luminal irregularity bilateral subclavian arteries  compatible with atherosclerosis. RIGHT CAROTID SYSTEM: Common carotid artery is patent, mild luminal irregularity compatible with atherosclerosis. Moderate calcific atherosclerosis carotid bifurcation without hemodynamically significant stenosis by NASCET criteria. Normal appearance of the internal carotid artery. LEFT CAROTID SYSTEM: Common carotid artery is patent. Moderate calcific atherosclerosis LEFT carotid bifurcation. LEFT internal carotid artery is occluded within 9 mm of the origin with calcific capping and calcifications along the occluded course. No reconstitution in the neck. VERTEBRAL ARTERIES:RIGHT vertebral artery is dominant. Severe stenosis RIGHT vertebral artery origin. Moderate luminal irregularity LEFT vertebral artery, vessel is smaller than the transverse foramen. Mild luminal irregularity RIGHT vertebral artery with calcific atherosclerosis. SKELETON: No acute osseous process though bone windows have not been submitted. Status post C4-5 ACDF with arthrodesis. OTHER NECK: Soft tissues of the neck are nonacute though, not tailored for evaluation. UPPER CHEST: Included lung apices are clear. Centrilobular emphysema. No superior mediastinal lymphadenopathy. CTA HEAD FINDINGS: ANTERIOR CIRCULATION: Atherosclerosis resulting in severe stenosis versus focally occluded RIGHT petrous segment. Severe stenosis RIGHT carotid siphon due to calcific atherosclerosis. Faint reconstitution LEFT carotid terminus. Patent anterior and middle cerebral arteries, mild luminal irregularity compatible with atherosclerosis. No large vessel occlusion, contrast extravasation or aneurysm. POSTERIOR CIRCULATION: Patent vertebral arteries, vertebrobasilar junction and basilar artery, as well as main branch vessels. Moderate luminal irregularity RIGHT vertebral artery. Focal severe stenosis LEFT V4 segment due to atherosclerosis. Patent posterior cerebral arteries, mild luminal irregularity compatible with atherosclerosis.  No large vessel occlusion, contrast extravasation or aneurysm. VENOUS SINUSES: Major dural venous sinuses are patent though not tailored for evaluation on this angiographic examination. ANATOMIC VARIANTS: None. DELAYED PHASE: No abnormal intracranial enhancement. MIP images reviewed. IMPRESSION: CTA NECK: 1. Chronically occluded LEFT ICA. No hemodynamically significant stenosis RIGHT ICA. 2. Luminal irregularity LEFT vertebral artery most compatible with old dissection without flow-limiting stenosis. 3. Luminal irregularity RIGHT vertebral artery seen with old dissection or atherosclerosis. Severe stenosis RIGHT vertebral artery origin. 4. Suspected RIGHT subclavian artery non flow-limiting dissection flap. No pseudoaneurysm. CTA HEAD: 1. No emergent large vessel occlusion.  Patent cerebral arteries. 2. Occluded LEFT ICA, reconstitution at carotid terminus. 3. Severe stenosis versus focally occluded  RIGHT petrous ICA, severe stenosis RIGHT carotid siphon. 4. Severe stenosis LEFT V4 segment. Emphysema (ICD10-J43.9).  Aortic Atherosclerosis (ICD10-I70.0). Electronically Signed   By: Elon Alas M.D.   On: 10/09/2017 01:48   Ct Angio Chest Pe W Or Wo Contrast  Result Date: 10/09/2017 CLINICAL DATA:  Shortness of breath and elevated D-dimer EXAM: CT ANGIOGRAPHY CHEST WITH CONTRAST TECHNIQUE: Multidetector CT imaging of the chest was performed using the standard protocol during bolus administration of intravenous contrast. Multiplanar CT image reconstructions and MIPs were obtained to evaluate the vascular anatomy. CONTRAST:  41mL ISOVUE-370 IOPAMIDOL (ISOVUE-370) INJECTION 76% COMPARISON:  CT angiogram chest April 07, 2017; chest radiograph October 08, 2017 prior chest CT January 02, 2014 FINDINGS: Cardiovascular: There is no demonstrable pulmonary embolus. There is no thoracic aortic aneurysm or dissection. Visualized great vessels show scattered areas of mild calcification without hemodynamically  significant obstruction evident. There are foci of aortic atherosclerosis. There are multiple areas of coronary artery calcification. There is no pericardial effusion or pericardial thickening evident. Mediastinum/Nodes: Thyroid appears unremarkable. There are multiple subcentimeter mediastinal lymph nodes, stable. There is a right hilar lymph node measuring 1.0 x 1.0 cm. A second right hilar lymph node also measures 1 x 1 cm. No esophageal lesions are evident. Lungs/Pleura: There is atelectatic change in the right base. There is a nodular appearing opacity in the posterior segment of the right lower lobe measuring 1.0 x 0.9 cm which appears stable from the previous study and likely represents rounded atelectasis. No other nodular appearing opacity is evident. There is scarring in the right middle lobe which is similar to previous study. There is mild lower lobe bronchiectatic change bilaterally. A cavitary lesion was present in this area in 2015. No pleural effusion or pleural thickening evident. Upper Abdomen: In the visualized upper abdomen, there is aortic and proximal great vessel atherosclerosis. Adrenals again are noted to be a appearance of the adrenals is stable compared to prior study. Musculoskeletal: There is stable anterior wedging of the T5 vertebral body. Old healed rib fractures are noted on the left. There is a prior fracture of the left clavicle with remodeling at the fracture site. No acute bony lesion is evident. No blastic or lytic bone lesions are appreciable. No chest wall lesions are evident. Review of the MIP images confirms the above findings. IMPRESSION: 1. No demonstrable pulmonary embolus. No thoracic aortic aneurysm or dissection. There is aortic atherosclerosis. There are foci of great vessel and coronary artery calcification. 2. Slightly prominent right hilar lymph nodes of uncertain etiology. Scattered subcentimeter lymph nodes elsewhere present. 3. Areas of atelectatic change and  mild lower lobe bronchiectatic change. Probable rounded atelectasis right base posteriorly. This area measures 1.0 x 0.9 cm, stable, and does have a somewhat nodular appearance. Given this appearance, a noncontrast enhanced CT in 6 months to assess for stability may be reasonable. 4.  Stable adrenal hypertrophy. 5. Multiple prior fractures. No acute appearing fracture. No blastic or lytic bone lesions evident. Aortic Atherosclerosis (ICD10-I70.0). Electronically Signed   By: Lowella Grip III M.D.   On: 10/09/2017 09:37   Mr Brain Wo Contrast  Result Date: 10/08/2017 CLINICAL DATA:  Generalized weakness, worse on the RIGHT. History of stroke, cervical spine injury, brain abscess, carotid artery stenosis. EXAM: MRI HEAD WITHOUT CONTRAST TECHNIQUE: Multiplanar, multiecho pulse sequences of the brain and surrounding structures were obtained without intravenous contrast. COMPARISON:  CT HEAD October 08, 2017 and MRI head July 22, 2016 and MRI head December 01, 2013.  FINDINGS: INTRACRANIAL CONTENTS: Patchy LEFT frontoparietal reduced diffusion with low ADC values. Bifrontal encephalomalacia with susceptibility artifact and areas of old infection. LEFT frontal/insular encephalomalacia. LEFT parietoccipital encephalomalacia. Old bilateral basal ganglia and thalami lacunar infarcts. Mild parenchymal brain volume loss for age. Patchy supratentorial and pontine white matter FLAIR T2 hyperintensities. No midline shift or mass effect. No abnormal extra-axial fluid collections. VASCULAR: Chronic loss of LEFT internal carotid artery flow void. SKULL AND UPPER CERVICAL SPINE: No abnormal sellar expansion. No suspicious calvarial bone marrow signal. Craniocervical junction maintained. SINUSES/ORBITS: The mastoid air-cells and included paranasal sinuses are well-aerated.The included ocular globes and orbital contents are non-suspicious. OTHER: None. IMPRESSION: 1. Acute small LEFT frontoparietal/MCA territory  nonhemorrhagic infarcts. 2. Old LEFT MCA and posterior border zone territory infarcts. Old basal ganglia and thalami infarcts. 3. Bifrontal and RIGHT basal ganglia encephalomalacia at site of prior brain abscess. 4. Mild parenchymal brain volume loss. 5. Chronically occluded LEFT ICA. Electronically Signed   By: Elon Alas M.D.   On: 10/08/2017 23:54   Anti-infectives: Anti-infectives (From admission, onward)   None      Assessment/Plan: s/p * No surgery found * Duplex reviewed.  No obvious fluid collection around the graft.  Continue IV antibiotics.  Discussed with patient no further options if this develops into extensive infection at the graft site will require ligation and will result in very high risk for amputation.  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  Very unfortunate situation.   LOS: 1 day   Kayleeann Huxford 10/19/2017, 7:52 AM

## 2017-10-20 ENCOUNTER — Encounter (HOSPITAL_COMMUNITY)
Admission: EM | Disposition: A | Discharge status: Home Health | Payer: Self-pay | Source: Home / Self Care | Attending: Vascular Surgery

## 2017-10-20 ENCOUNTER — Inpatient Hospital Stay (HOSPITAL_COMMUNITY): Payer: Medicaid Other | Admitting: Certified Registered"

## 2017-10-20 ENCOUNTER — Encounter (HOSPITAL_COMMUNITY): Payer: Self-pay | Admitting: *Deleted

## 2017-10-20 DIAGNOSIS — T82898A Other specified complication of vascular prosthetic devices, implants and grafts, initial encounter: Secondary | ICD-10-CM

## 2017-10-20 HISTORY — PX: FEMORAL-POPLITEAL BYPASS GRAFT: SHX937

## 2017-10-20 LAB — GLUCOSE, CAPILLARY
GLUCOSE-CAPILLARY: 134 mg/dL — AB (ref 70–99)
Glucose-Capillary: 103 mg/dL — ABNORMAL HIGH (ref 70–99)

## 2017-10-20 LAB — MRSA PCR SCREENING: MRSA BY PCR: NEGATIVE

## 2017-10-20 SURGERY — BYPASS GRAFT FEMORAL-POPLITEAL ARTERY
Anesthesia: General | Laterality: Right

## 2017-10-20 MED ORDER — FENTANYL CITRATE (PF) 100 MCG/2ML IJ SOLN
25.0000 ug | INTRAMUSCULAR | Status: DC | PRN
  Administered 2017-10-20 (×3): 50 ug via INTRAVENOUS

## 2017-10-20 MED ORDER — HYDROMORPHONE HCL 1 MG/ML IJ SOLN
0.5000 mg | INTRAMUSCULAR | Status: DC | PRN
  Administered 2017-10-20 (×2): 0.5 mg via INTRAVENOUS

## 2017-10-20 MED ORDER — PHENYLEPHRINE 40 MCG/ML (10ML) SYRINGE FOR IV PUSH (FOR BLOOD PRESSURE SUPPORT)
PREFILLED_SYRINGE | INTRAVENOUS | Status: AC
  Filled 2017-10-20: qty 10

## 2017-10-20 MED ORDER — LACTATED RINGERS IV SOLN
INTRAVENOUS | Status: DC
  Administered 2017-10-20: 17:00:00 via INTRAVENOUS

## 2017-10-20 MED ORDER — LACTATED RINGERS IV SOLN
INTRAVENOUS | Status: DC
  Administered 2017-10-20: 16:00:00 via INTRAVENOUS

## 2017-10-20 MED ORDER — PROPOFOL 10 MG/ML IV BOLUS
INTRAVENOUS | Status: AC
  Filled 2017-10-20: qty 20

## 2017-10-20 MED ORDER — PROTAMINE SULFATE 10 MG/ML IV SOLN
INTRAVENOUS | Status: DC | PRN
  Administered 2017-10-20: 50 mg via INTRAVENOUS

## 2017-10-20 MED ORDER — HYDROMORPHONE HCL 1 MG/ML IJ SOLN
1.0000 mg | INTRAMUSCULAR | Status: DC | PRN
  Administered 2017-10-20 – 2017-10-23 (×8): 1 mg via INTRAVENOUS
  Filled 2017-10-20 (×16): qty 1

## 2017-10-20 MED ORDER — HEPARIN SODIUM (PORCINE) 1000 UNIT/ML IJ SOLN
INTRAMUSCULAR | Status: DC | PRN
  Administered 2017-10-20: 6000 [IU] via INTRAVENOUS

## 2017-10-20 MED ORDER — MIDAZOLAM HCL 5 MG/5ML IJ SOLN
INTRAMUSCULAR | Status: DC | PRN
  Administered 2017-10-20: 2 mg via INTRAVENOUS

## 2017-10-20 MED ORDER — OXYCODONE HCL 5 MG/5ML PO SOLN: 5.0000 mg | Freq: Once | ORAL | Status: AC | PRN

## 2017-10-20 MED ORDER — PHENYLEPHRINE 40 MCG/ML (10ML) SYRINGE FOR IV PUSH (FOR BLOOD PRESSURE SUPPORT)
PREFILLED_SYRINGE | INTRAVENOUS | Status: DC | PRN
  Administered 2017-10-20 (×4): 80 ug via INTRAVENOUS

## 2017-10-20 MED ORDER — LIDOCAINE 2% (20 MG/ML) 5 ML SYRINGE
INTRAMUSCULAR | Status: AC
  Filled 2017-10-20: qty 5

## 2017-10-20 MED ORDER — ROCURONIUM BROMIDE 50 MG/5ML IV SOSY
PREFILLED_SYRINGE | INTRAVENOUS | Status: AC
  Filled 2017-10-20: qty 5

## 2017-10-20 MED ORDER — SUCCINYLCHOLINE CHLORIDE 20 MG/ML IJ SOLN
INTRAMUSCULAR | Status: DC | PRN
  Administered 2017-10-20: 60 mg via INTRAVENOUS

## 2017-10-20 MED ORDER — 0.9 % SODIUM CHLORIDE (POUR BTL) OPTIME
TOPICAL | Status: DC | PRN
  Administered 2017-10-20: 2000 mL

## 2017-10-20 MED ORDER — OXYCODONE HCL 5 MG PO TABS
ORAL_TABLET | ORAL | Status: AC
  Filled 2017-10-20: qty 1

## 2017-10-20 MED ORDER — HYDROMORPHONE HCL 1 MG/ML IJ SOLN
INTRAMUSCULAR | Status: AC
  Filled 2017-10-20: qty 1

## 2017-10-20 MED ORDER — HEPARIN SODIUM (PORCINE) 1000 UNIT/ML IJ SOLN
INTRAMUSCULAR | Status: AC
  Filled 2017-10-20: qty 1

## 2017-10-20 MED ORDER — FENTANYL CITRATE (PF) 100 MCG/2ML IJ SOLN
INTRAMUSCULAR | Status: AC
  Filled 2017-10-20: qty 2

## 2017-10-20 MED ORDER — CEFAZOLIN SODIUM-DEXTROSE 1-4 GM/50ML-% IV SOLN
1.0000 g | INTRAVENOUS | Status: AC
  Administered 2017-10-20: 1 g via INTRAVENOUS
  Filled 2017-10-20 (×2): qty 50

## 2017-10-20 MED ORDER — SUCCINYLCHOLINE CHLORIDE 200 MG/10ML IV SOSY
PREFILLED_SYRINGE | INTRAVENOUS | Status: AC
  Filled 2017-10-20: qty 10

## 2017-10-20 MED ORDER — OXYCODONE HCL 5 MG PO TABS
5.0000 mg | ORAL_TABLET | Freq: Once | ORAL | Status: AC | PRN
  Administered 2017-10-20: 5 mg via ORAL

## 2017-10-20 MED ORDER — FENTANYL CITRATE (PF) 250 MCG/5ML IJ SOLN
INTRAMUSCULAR | Status: AC
  Filled 2017-10-20: qty 5

## 2017-10-20 MED ORDER — SODIUM CHLORIDE 0.9 % IV SOLN
INTRAVENOUS | Status: AC
  Filled 2017-10-20: qty 1.2

## 2017-10-20 MED ORDER — PROTAMINE SULFATE 10 MG/ML IV SOLN
INTRAVENOUS | Status: AC
  Filled 2017-10-20: qty 5

## 2017-10-20 MED ORDER — HYDROMORPHONE HCL 1 MG/ML IJ SOLN
1.0000 mg | Freq: Once | INTRAMUSCULAR | Status: AC
  Administered 2017-10-20: 1 mg via INTRAVENOUS
  Filled 2017-10-20: qty 1

## 2017-10-20 MED ORDER — MIDAZOLAM HCL 2 MG/2ML IJ SOLN
INTRAMUSCULAR | Status: AC
  Filled 2017-10-20: qty 2

## 2017-10-20 MED ORDER — ACETAMINOPHEN 500 MG PO TABS: 1000.0000 mg | ORAL_TABLET | Freq: Once | ORAL | Status: DC | PRN

## 2017-10-20 MED ORDER — DEXAMETHASONE SODIUM PHOSPHATE 10 MG/ML IJ SOLN
INTRAMUSCULAR | Status: AC
  Filled 2017-10-20: qty 1

## 2017-10-20 MED ORDER — DEXAMETHASONE SODIUM PHOSPHATE 10 MG/ML IJ SOLN
INTRAMUSCULAR | Status: DC | PRN
  Administered 2017-10-20: 8 mg via INTRAVENOUS

## 2017-10-20 MED ORDER — FENTANYL CITRATE (PF) 100 MCG/2ML IJ SOLN
INTRAMUSCULAR | Status: DC | PRN
  Administered 2017-10-20 (×5): 50 ug via INTRAVENOUS

## 2017-10-20 MED ORDER — ONDANSETRON HCL 4 MG/2ML IJ SOLN
INTRAMUSCULAR | Status: DC | PRN
  Administered 2017-10-20: 4 mg via INTRAVENOUS

## 2017-10-20 MED ORDER — ONDANSETRON HCL 4 MG/2ML IJ SOLN
INTRAMUSCULAR | Status: AC
  Filled 2017-10-20: qty 2

## 2017-10-20 MED ORDER — SODIUM CHLORIDE 0.9 % IV SOLN
INTRAVENOUS | Status: DC | PRN
  Administered 2017-10-20: 40 ug/min via INTRAVENOUS

## 2017-10-20 MED ORDER — ACETAMINOPHEN 160 MG/5ML PO SOLN: 1000.0000 mg | Freq: Once | ORAL | Status: DC | PRN

## 2017-10-20 MED ORDER — ACETAMINOPHEN 10 MG/ML IV SOLN: 1000.0000 mg | Freq: Once | INTRAVENOUS | Status: DC | PRN

## 2017-10-20 MED ORDER — PROPOFOL 10 MG/ML IV BOLUS
INTRAVENOUS | Status: DC | PRN
  Administered 2017-10-20: 50 mg via INTRAVENOUS
  Administered 2017-10-20: 80 mg via INTRAVENOUS
  Administered 2017-10-20: 10 mg via INTRAVENOUS
  Administered 2017-10-20: 60 mg via INTRAVENOUS

## 2017-10-20 SURGICAL SUPPLY — 52 items
ADH SKN CLS APL DERMABOND .7 (GAUZE/BANDAGES/DRESSINGS)
BANDAGE ACE 4X5 VEL STRL LF (GAUZE/BANDAGES/DRESSINGS) ×2 IMPLANT
BANDAGE ESMARK 6X9 LF (GAUZE/BANDAGES/DRESSINGS) IMPLANT
BNDG CMPR 9X6 STRL LF SNTH (GAUZE/BANDAGES/DRESSINGS) ×1
BNDG ESMARK 6X9 LF (GAUZE/BANDAGES/DRESSINGS) ×3
BNDG GAUZE ELAST 4 BULKY (GAUZE/BANDAGES/DRESSINGS) ×2 IMPLANT
CANISTER SUCT 3000ML PPV (MISCELLANEOUS) ×3 IMPLANT
CANNULA VESSEL 3MM 2 BLNT TIP (CANNULA) ×6 IMPLANT
CLIP LIGATING EXTRA MED SLVR (CLIP) ×3 IMPLANT
CLIP LIGATING EXTRA SM BLUE (MISCELLANEOUS) ×3 IMPLANT
COVER WAND RF STERILE (DRAPES) ×3 IMPLANT
CUFF TOURNIQUET SINGLE 24IN (TOURNIQUET CUFF) ×2 IMPLANT
CUFF TOURNIQUET SINGLE 34IN LL (TOURNIQUET CUFF) IMPLANT
CUFF TOURNIQUET SINGLE 44IN (TOURNIQUET CUFF) IMPLANT
DERMABOND ADVANCED (GAUZE/BANDAGES/DRESSINGS)
DERMABOND ADVANCED .7 DNX12 (GAUZE/BANDAGES/DRESSINGS) ×1 IMPLANT
DRAIN SNY 10X20 3/4 PERF (WOUND CARE) IMPLANT
DRAPE HALF SHEET 40X57 (DRAPES) IMPLANT
DRAPE X-RAY CASS 24X20 (DRAPES) IMPLANT
ELECT REM PT RETURN 9FT ADLT (ELECTROSURGICAL) ×3
ELECTRODE REM PT RTRN 9FT ADLT (ELECTROSURGICAL) ×1 IMPLANT
EVACUATOR SILICONE 100CC (DRAIN) IMPLANT
GAUZE SPONGE 4X4 12PLY STRL LF (GAUZE/BANDAGES/DRESSINGS) ×2 IMPLANT
GLOVE SS BIOGEL STRL SZ 7.5 (GLOVE) ×1 IMPLANT
GLOVE SUPERSENSE BIOGEL SZ 7.5 (GLOVE) ×2
GOWN STRL REUS W/ TWL LRG LVL3 (GOWN DISPOSABLE) ×3 IMPLANT
GOWN STRL REUS W/TWL LRG LVL3 (GOWN DISPOSABLE) ×9
INSERT FOGARTY SM (MISCELLANEOUS) IMPLANT
KIT BASIN OR (CUSTOM PROCEDURE TRAY) ×3 IMPLANT
KIT TURNOVER KIT B (KITS) ×3 IMPLANT
NS IRRIG 1000ML POUR BTL (IV SOLUTION) ×6 IMPLANT
PACK PERIPHERAL VASCULAR (CUSTOM PROCEDURE TRAY) ×3 IMPLANT
PAD ABD 8X10 STRL (GAUZE/BANDAGES/DRESSINGS) ×2 IMPLANT
PAD ARMBOARD 7.5X6 YLW CONV (MISCELLANEOUS) ×6 IMPLANT
PADDING CAST COTTON 6X4 STRL (CAST SUPPLIES) IMPLANT
SET COLLECT BLD 21X3/4 12 (NEEDLE) IMPLANT
STOPCOCK 4 WAY LG BORE MALE ST (IV SETS) IMPLANT
SUT ETHILON 3 0 PS 1 (SUTURE) IMPLANT
SUT PROLENE 5 0 C 1 24 (SUTURE) ×7 IMPLANT
SUT PROLENE 6 0 CC (SUTURE) ×1 IMPLANT
SUT SILK 0 TIES 10X30 (SUTURE) ×2 IMPLANT
SUT SILK 2 0 SH (SUTURE) ×1 IMPLANT
SUT VIC AB 2-0 CTX 36 (SUTURE) ×2 IMPLANT
SUT VIC AB 3-0 SH 27 (SUTURE) ×9
SUT VIC AB 3-0 SH 27X BRD (SUTURE) ×2 IMPLANT
SWAB COLLECTION DEVICE MRSA (MISCELLANEOUS) ×2 IMPLANT
SWAB CULTURE LIQUID MINI MALE (MISCELLANEOUS) ×2 IMPLANT
TOWEL GREEN STERILE (TOWEL DISPOSABLE) ×3 IMPLANT
TRAY FOLEY MTR SLVR 16FR STAT (SET/KITS/TRAYS/PACK) ×1 IMPLANT
TUBING EXTENTION W/L.L. (IV SETS) IMPLANT
UNDERPAD 30X30 (UNDERPADS AND DIAPERS) ×3 IMPLANT
WATER STERILE IRR 1000ML POUR (IV SOLUTION) ×3 IMPLANT

## 2017-10-20 NOTE — Op Note (Signed)
    OPERATIVE REPORT  DATE OF SURGERY: 10/20/2017  PATIENT: [name], 57 y.o. 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  PHYSICIAN ASSISTANT: Liana Crocker, PA-C  ANESTHESIA: General  EBL: per anesthesia record  No intake/output data recorded.  BLOOD ADMINISTERED: none  DRAINS: none  SPECIMEN: none  COUNTS CORRECT:  YES  PATIENT DISPOSITION:  PACU - hemodynamically stable  PROCEDURE DETAILS: Patient is very complicated patient who has had multiple prior right leg operations.  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  This was several months ago when he had a similar severe infection and blowout of his bypass.  He had removal of the only remaining vein left and the basilic vein and had tunneling around this area and eventually healing.  Unfortunately he continued to inject heroin into his graft and now presents with infection of this graft at the lateral aspect of his knee.  He initially was placed on antibiotics but this is progressed and now was pointing with obvious disruption and a false aneurysm.  I discussed this with him in the past that he had no further options for bypass and if this became infected would require ligation which would in all likelihood result in amputation.  The patient was taken to the operative placed supine position where the area of the right leg was prepped and draped you sterile fashion.  A pneumatic tourniquet was placed in the thigh and the leg was elevated and exsanguinated with an Esmarch tourniquet.  This was after the patient received 6000 units of intravenous heparin.  After adequate circulation time and the  tourniquet was inflated.  The area of acute false aneurysm was opened longitudinally and there was a complete disruption of the vein graft to this area.  This was sent for aerobic and anaerobic culture.  The vein it was a completely disrupted over approximately 3 cm.  This was all debrided.  The distal vein was exposed and was oversewn with 2 layers of 5-0 Prolene suture.  Proximal to the disrupted area on the distal thigh a separate incision was made over the graft.  The graft was isolated at this area and was transected and was oversewn with 2 layers of 5-0 Prolene suture.  There is no evidence of infection at this area.  The pneumatic tourniquet was deflated and there was adequate hemostasis.  The patient was given 50 mg of protamine to reverse heparin.  The area at the lateral knee was further debrided of all nonviable tissue and was packed with Betadine 4 x 4 and then ABD and Ace was placed over this.  The incision of the thigh was closed with 2 layers with 3-0 Vicryl suture.  Sterile dressing was applied to this and the patient was transferred to the recovery room in stable condition   [name], M.D., [facility] 10/20/2017 7:28 PM

## 2017-10-20 NOTE — Anesthesia Postprocedure Evaluation (Signed)
Anesthesia Post Note  Patient: [name]  Procedure(s) Performed: DEBRIDEMENT and LIGATION OF RIGHT FEMORAL TO TIBIAL BYPASS (Right )     Patient location during evaluation: PACU Anesthesia Type: General Level of consciousness: awake and alert Pain management: pain level controlled Vital Signs Assessment: post-procedure vital signs reviewed and stable Respiratory status: spontaneous breathing, nonlabored ventilation, respiratory function stable and patient connected to nasal cannula oxygen Cardiovascular status: blood pressure returned to baseline and stable Postop Assessment: no apparent nausea or vomiting Anesthetic complications: no    Last Vitals:  Vitals:   10/20/17 2030 10/20/17 2053  BP: 114/72 116/69  Pulse: 85 83  Resp: (!) 35 18  Temp: 37.2 C 36.4 C  SpO2: 95% 95%    Last Pain:  Vitals:   10/20/17 2053  TempSrc: Oral  PainSc:                  Lorine Iannaccone COKER

## 2017-10-20 NOTE — Transfer of Care (Signed)
Immediate Anesthesia Transfer of Care Note  Patient: [name]  Procedure(s) Performed: DEBRIDEMENT and LIGATION OF RIGHT FEMORAL TO TIBIAL BYPASS (Right )  Patient Location: PACU  Anesthesia Type:General  Level of Consciousness: awake, alert  and patient cooperative  Airway & Oxygen Therapy: Patient Spontanous Breathing and Patient connected to face mask oxygen  Post-op Assessment: Report given to RN and Post -op Vital signs reviewed and stable  Post vital signs: Reviewed and stable  Last Vitals:  Vitals Value Taken Time  BP 120/67 10/20/2017  7:34 PM  Temp    Pulse 93 10/20/2017  7:34 PM  Resp 14 10/20/2017  7:34 PM  SpO2 100 % 10/20/2017  7:34 PM  Vitals shown include unvalidated device data.  Last Pain:  Vitals:   10/20/17 1521  TempSrc: Oral  PainSc:          Complications: No apparent anesthesia complications

## 2017-10-20 NOTE — Progress Notes (Addendum)
  Progress Note    10/20/2017 8:47 AM  Subjective:  Increase in pain R lateral knee; patient also states "it is coming to a head"   Vitals:   10/20/17 0005 10/20/17 0742  BP: 137/72 114/75  Pulse: 82 79  Resp: 17 14  Temp: 97.6 F (36.4 C) 97.7 F (36.5 C)  SpO2: 98% 100%   Physical Exam: Lungs:  Non labored Extremities:  Palpable pulsatile flow through graft in thigh; indurated area now pulsatile with severe tenderness to light touch Abdomen:  Soft Neurologic: A&O  CBC    Component Value Date/Time   WBC 9.5 10/18/2017 0306   RBC 4.78 10/18/2017 0306   HGB 10.7 (L) 10/18/2017 0306   HGB 12.0 (L) 08/30/2017 1417   HCT 35.4 (L) 10/18/2017 0306   HCT 39.4 08/30/2017 1417   PLT 330 10/18/2017 0306   PLT 341 08/30/2017 1417   MCV 74.1 (L) 10/18/2017 0306   MCV 78 (L) 08/30/2017 1417   MCH 22.4 (L) 10/18/2017 0306   MCHC 30.2 10/18/2017 0306   RDW 19.5 (H) 10/18/2017 0306   RDW 17.9 (H) 08/30/2017 1417   LYMPHSABS 3.2 10/18/2017 0306   LYMPHSABS 1.2 08/30/2017 1417   MONOABS 0.9 10/18/2017 0306   EOSABS 0.2 10/18/2017 0306   EOSABS 0.1 08/30/2017 1417   BASOSABS 0.1 10/18/2017 0306   BASOSABS 0.0 08/30/2017 1417    BMET    Component Value Date/Time   NA 132 (L) 10/18/2017 0306   NA 137 08/30/2017 1417   K 4.7 10/18/2017 0306   CL 100 10/18/2017 0306   CO2 23 10/18/2017 0306   GLUCOSE 112 (H) 10/18/2017 0306   BUN 35 (H) 10/18/2017 0306   BUN 20 08/30/2017 1417   CREATININE 1.11 10/18/2017 0306   CREATININE 0.85 05/10/2014 1114   CALCIUM 9.1 10/18/2017 0306   CALCIUM 9.1 10/28/2009 2118   GFRNONAA >60 10/18/2017 0306   GFRNONAA >89 02/01/2014 1518   GFRAA >60 10/18/2017 0306   GFRAA >89 02/01/2014 1518    INR    Component Value Date/Time   INR 1.17 10/08/2017 1850     Intake/Output Summary (Last 24 hours) at 10/20/2017 0847 Last data filed at 10/20/2017 0745 Gross per 24 hour  Intake 720 ml  Output 2475 ml  Net -1755 ml      Assessment/Plan:  57 y.o. male with suspected infection of RLE bypass  Increase in pain and size of area in question RLE bypass Concern is for potential to rupture; now some pulsatility to lateral knee mass Keep npo Possible I&D and ligation of RLE bypass in OR this afternoon with Dr. Cherly Anderson, PA-C [facility] [phone] 10/20/2017 8:47 AM  I have examined the patient, reviewed and agree with above. Has had ongoing ranges in the area of his right lateral knee.  Now thinning and darkening of the skin.  Splane will require debridement in the operating room.  No other options for revision.  Explained high chance that we will be required to ligate his graft.  If so he in all likelihood does not have adequate perfusion for limb salvage.  Explained that we would not proceed with amputation today but would determine the viability of his foot. Curt Jews, MD 10/20/2017 10:45 AM

## 2017-10-20 NOTE — Anesthesia Preprocedure Evaluation (Signed)
Anesthesia Evaluation  Patient identified by MRN, date of birth, ID band Patient awake    Reviewed: Allergy & Precautions, NPO status , Patient's Chart, lab work & pertinent test results  Airway Mallampati: II  TM Distance: >3 FB Neck ROM: Full    Dental   Pulmonary Current Smoker,    breath sounds clear to auscultation       Cardiovascular hypertension,  Rhythm:Regular Rate:Normal     Neuro/Psych    GI/Hepatic   Endo/Other  diabetes  Renal/GU      Musculoskeletal   Abdominal   Peds  Hematology   Anesthesia Other Findings   Reproductive/Obstetrics                             Anesthesia Physical Anesthesia Plan  ASA: III  Anesthesia Plan: General   Post-op Pain Management:    Induction: Intravenous  PONV Risk Score and Plan: Ondansetron and Dexamethasone  Airway Management Planned: Oral ETT  Additional Equipment:   Intra-op Plan:   Post-operative Plan: Extubation in OR  Informed Consent: I have reviewed the patients History and Physical, chart, labs and discussed the procedure including the risks, benefits and alternatives for the proposed anesthesia with the patient or authorized representative who has indicated his/her understanding and acceptance.   Dental advisory given  Plan Discussed with: CRNA and Anesthesiologist  Anesthesia Plan Comments:         Anesthesia Quick Evaluation

## 2017-10-20 NOTE — Progress Notes (Signed)
Pt already finished his full breakfast at 08:45 am, before got order for stat NPO for surgery today. I called OR to give report. The surgery may  have to postpone after 5 pm due to Pt has full stomach. Require to be NPO over 8 hours. Will monitor.  Kennyth Lose, RN

## 2017-10-20 NOTE — Progress Notes (Signed)
Dr. Donnetta Hutching contacted regarding drainage from incision sight on right lateral knee.  Per Dr. Donnetta Hutching, drainage is expected.  If drainage becomes excessive, place extra ABD pad on top of ace wrap and reinforce with a second ace wrap.  Do not break down dressing tonight.

## 2017-10-20 NOTE — Anesthesia Procedure Notes (Signed)
Procedure Name: Intubation Date/Time: 10/20/2017 6:05 PM Performed by: Orlie Dakin, CRNA Pre-anesthesia Checklist: Patient identified, Emergency Drugs available, Suction available and Patient being monitored Patient Re-evaluated:Patient Re-evaluated prior to induction Oxygen Delivery Method: Circle system utilized Preoxygenation: Pre-oxygenation with 100% oxygen Induction Type: IV induction Ventilation: Mask ventilation without difficulty and Oral airway inserted - appropriate to patient size Laryngoscope Size: Sabra Heck and 3 Grade View: Grade I Tube type: Oral Tube size: 7.5 mm Number of attempts: 1 Airway Equipment and Method: Stylet Placement Confirmation: ETT inserted through vocal cords under direct vision,  positive ETCO2 and breath sounds checked- equal and bilateral Secured at: 24 cm Tube secured with: Tape Dental Injury: Teeth and Oropharynx as per pre-operative assessment  Comments: Noted very poor dentition pre-op.  Many missing on bottom, some loose per patient report.  Edentulous on top.

## 2017-10-21 ENCOUNTER — Encounter (HOSPITAL_COMMUNITY): Payer: Self-pay | Admitting: Vascular Surgery

## 2017-10-21 LAB — GLUCOSE, CAPILLARY
Glucose-Capillary: 174 mg/dL — ABNORMAL HIGH (ref 70–99)
Glucose-Capillary: 204 mg/dL — ABNORMAL HIGH (ref 70–99)
Glucose-Capillary: 185 mg/dL — ABNORMAL HIGH (ref 70–99)

## 2017-10-21 MED ORDER — HYDROMORPHONE HCL 1 MG/ML IJ SOLN
1.0000 mg | INTRAMUSCULAR | Status: DC | PRN
  Administered 2017-10-21 – 2017-10-26 (×40): 1 mg via INTRAVENOUS
  Filled 2017-10-21 (×35): qty 1

## 2017-10-21 NOTE — Progress Notes (Addendum)
  Progress Note    10/21/2017 8:42 AM 1 Day Post-Op  Subjective:  Pain at surgical site   Vitals:   10/20/17 2346 10/21/17 0355  BP: 105/67 123/61  Pulse: 87 78  Resp: 20 12  Temp: 97.7 F (36.5 C) 97.9 F (36.6 C)  SpO2: 96% 99%   Physical Exam: Lungs:  Non labored Incisions:  R thigh incision c/d/i; lateral knee surgical wound without active bleeding when dressing taken down, healthy wound bed Extremities:  r foot cold to touch; no doppler signals Abdomen:  Soft Neurologic: A&O  CBC    Component Value Date/Time   WBC 9.5 10/18/2017 0306   RBC 4.78 10/18/2017 0306   HGB 10.7 (L) 10/18/2017 0306   HGB CANCELED 10/15/2017 0000   HCT 35.4 (L) 10/18/2017 0306   HCT CANCELED 10/15/2017 0000   PLT 330 10/18/2017 0306   PLT CANCELED 10/15/2017 0000   MCV 74.1 (L) 10/18/2017 0306   MCV 78 (L) 08/30/2017 1417   MCH 22.4 (L) 10/18/2017 0306   MCHC 30.2 10/18/2017 0306   RDW 19.5 (H) 10/18/2017 0306   RDW 17.9 (H) 08/30/2017 1417   LYMPHSABS 3.2 10/18/2017 0306   LYMPHSABS CANCELED 10/15/2017 0000   MONOABS 0.9 10/18/2017 0306   EOSABS 0.2 10/18/2017 0306   EOSABS CANCELED 10/15/2017 0000   BASOSABS 0.1 10/18/2017 0306   BASOSABS CANCELED 10/15/2017 0000    BMET    Component Value Date/Time   NA 132 (L) 10/18/2017 0306   NA 132 (L) 10/15/2017 0000   K 4.7 10/18/2017 0306   CL 100 10/18/2017 0306   CO2 23 10/18/2017 0306   GLUCOSE 112 (H) 10/18/2017 0306   BUN 35 (H) 10/18/2017 0306   BUN 17 10/15/2017 0000   CREATININE 1.11 10/18/2017 0306   CREATININE 0.85 05/10/2014 1114   CALCIUM 9.1 10/18/2017 0306   CALCIUM 9.1 10/28/2009 2118   GFRNONAA >60 10/18/2017 0306   GFRNONAA >89 02/01/2014 1518   GFRAA >60 10/18/2017 0306   GFRAA >89 02/01/2014 1518    INR    Component Value Date/Time   INR 1.17 10/08/2017 1850     Intake/Output Summary (Last 24 hours) at 10/21/2017 0842 Last data filed at 10/21/2017 5361 Gross per 24 hour  Intake 1989.68 ml    Output 2675 ml  Net -685.32 ml     Assessment/Plan:  57 y.o. male is s/p repair of pseudoaneurysm and ligation of RLE bypass 1 Day Post-Op   Dressing changed this morning, no active bleeding, continue wet to dry dressing changes R foot cold to touch without doppler signals Patient not willing to consent to R AKA at this time Continue pain control and wound care for now   Dagoberto Ligas, PA-C [facility] [phone] 10/21/2017 8:42 AM  I have examined the patient, reviewed and agree with above.  Patient reports that he has been able to be walking in the room.  Denies any pain in his right foot.  Does complain of pain in his right incision which is not being controlled with his current pain medication.  Obviously pain management is always a problem with his severe narcotic addiction.  Curt Jews, MD 10/21/2017 11:15 AM

## 2017-10-22 NOTE — Progress Notes (Addendum)
  Progress Note    10/22/2017 7:59 AM 2 Days Post-Op  Subjective:  Denies rest pain R foot   Vitals:   10/22/17 0415 10/22/17 0752  BP: (!) 108/56 118/63  Pulse: 80 81  Resp: 11 16  Temp: 97.6 F (36.4 C) 97.6 F (36.4 C)  SpO2: 100% 98%   Physical Exam: Lungs:  Non labored Incisions:  Incision above knee c/d/i; lateral knee incision with sanguinous collection in dressing however no active bleeding in wound bed; foot cold to touch without doppler signals Abdomen:  Soft Neurologic: A&O  CBC    Component Value Date/Time   WBC 9.5 10/18/2017 0306   RBC 4.78 10/18/2017 0306   HGB 10.7 (L) 10/18/2017 0306   HGB CANCELED 10/15/2017 0000   HCT 35.4 (L) 10/18/2017 0306   HCT CANCELED 10/15/2017 0000   PLT 330 10/18/2017 0306   PLT CANCELED 10/15/2017 0000   MCV 74.1 (L) 10/18/2017 0306   MCV 78 (L) 08/30/2017 1417   MCH 22.4 (L) 10/18/2017 0306   MCHC 30.2 10/18/2017 0306   RDW 19.5 (H) 10/18/2017 0306   RDW 17.9 (H) 08/30/2017 1417   LYMPHSABS 3.2 10/18/2017 0306   LYMPHSABS CANCELED 10/15/2017 0000   MONOABS 0.9 10/18/2017 0306   EOSABS 0.2 10/18/2017 0306   EOSABS CANCELED 10/15/2017 0000   BASOSABS 0.1 10/18/2017 0306   BASOSABS CANCELED 10/15/2017 0000    BMET    Component Value Date/Time   NA 132 (L) 10/18/2017 0306   NA 132 (L) 10/15/2017 0000   K 4.7 10/18/2017 0306   CL 100 10/18/2017 0306   CO2 23 10/18/2017 0306   GLUCOSE 112 (H) 10/18/2017 0306   BUN 35 (H) 10/18/2017 0306   BUN 17 10/15/2017 0000   CREATININE 1.11 10/18/2017 0306   CREATININE 0.85 05/10/2014 1114   CALCIUM 9.1 10/18/2017 0306   CALCIUM 9.1 10/28/2009 2118   GFRNONAA >60 10/18/2017 0306   GFRNONAA >89 02/01/2014 1518   GFRAA >60 10/18/2017 0306   GFRAA >89 02/01/2014 1518    INR    Component Value Date/Time   INR 1.17 10/08/2017 1850     Intake/Output Summary (Last 24 hours) at 10/22/2017 0759 Last data filed at 10/22/2017 0753 Gross per 24 hour  Intake 1815.36  ml  Output 1875 ml  Net -59.64 ml     Assessment/Plan:  57 y.o. male is s/p exploration and ligation of graft RLE 2 Days Post-Op   -  Dressing changed this morning, healthy appearing wound bed without active bleeding; continue current wound care -  No rest pain R foot per patient; patient again made aware he is at high risk for R AKA -  Post op pain still requiring IV medication; again discussed need to transition to p.o. Pain control in the near future - PT/OT to eval and treat - Case manager and CSW consulted for complex discharge needs as well as homelessness  Dagoberto Ligas, PA-C [facility] [phone] 10/22/2017 7:59 AM  I have examined the patient, reviewed and agree with above.  Right foot cooler than left but denies rest pain.  Motor and sensory seem to be intact.  Continue to mobilize.  Continue local wound care normal saline wet-to-dry to right lateral knee wound.  Cultures from OR pending  Curt Jews, MD 10/22/2017 8:25 AM

## 2017-10-22 NOTE — Progress Notes (Signed)
At 0510, patient HR reached 155. Patient stated he felt dizzy, nauseous and could feel his heart beating faster. Patient given 2 mg metoprolol IV. HR dropped to 86 and NSR. Patient also given Zofran 4 mg for nausea. Patient is resting. Will continue to monitor. Lajoyce Corners, RN

## 2017-10-22 NOTE — Care Management Note (Signed)
Case Management Note Marvetta Gibbons RN, BSN Transitions of Care Unit 4E- RN Case Manager [phone]  Patient Details  Name: [name] MRN: [id number] Date of Birth: [birth year]  Subjective/Objective:    Pt admitted with infected graft, s/p I&D of graft, hx IVDU                 Action/Plan: PTA pt lived at home, concerns about possible homelessness- CSW consulted. Per PT note pt reports that he will be staying with daughter. Pt has PCS aide daily.  Followed by [facility]. Pt has active Medicaid for medication needs.  Per PT no recommendations for f/u transition needs. Pt ambulating independently on unit. CM will follow for transition of care needs-   Expected Discharge Date:                  Expected Discharge Plan:     In-House Referral:  Clinical Social Work  Discharge planning Services  CM Consult  Post Acute Care Choice:    Choice offered to:     DME Arranged:    DME Agency:     HH Arranged:    [place] Agency:     Status of Service:  In process, will continue to follow  If discussed at Long Length of Stay Meetings, dates discussed:    Discharge Disposition:   Additional Comments:  Dawayne Patricia, RN 10/22/2017, 3:01 PM

## 2017-10-22 NOTE — Evaluation (Signed)
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 PMhx: multiple RLE BPG, polysubstance abuse, chronic pain, RA, seizures, CHF, CVA, DM, COPD, depression, HTN, HLD, TB, hep C, pVD,   Clinical Impression  Pt pleasant sitting EOB and able to stand with initial unsteadiness reaching for environmental support. In standing pt able to stabilize with cane and walk long hall distance. Pt reports he is staying with his daughter and has an aide daily but is able to bathe and dress on his own. Pt with painful RLE with stepping and moving but able to complete all basic mobility without assist. Pt at baseline functional level with education for proper cane use completed. No further needs at this time and will sign off with pt aware and agreeable. Pt encouraged to continue use of AD at all times with gait.      Follow Up Recommendations No PT follow up    Equipment Recommendations  None recommended by PT    Recommendations for Other Services       Precautions / Restrictions Precautions Precautions: Fall      Mobility  Bed Mobility               General bed mobility comments: At EOB upon arrival  Transfers Overall transfer level: Modified independent                  Ambulation/Gait Ambulation/Gait assistance: Supervision Gait Distance (Feet): 250 Feet Assistive device: Straight cane Gait Pattern/deviations: Step-to pattern;Antalgic;Decreased stance time - right   Gait velocity interpretation: >2.62 ft/sec, indicative of community ambulatory General Gait Details: pt initially using cane on right with cues for proper cane use and transition to left hand pt with improved gait. Pt with slow antalgic gait but steady with use of cane  Stairs Stairs: Yes Stairs assistance: Modified independent  (Device/Increase time) Stair Management: Step to pattern;One rail Right;With cane Number of Stairs: 5 General stair comments: pt with correct sequence with cane and rail able to ascend and descend 5 steps  Wheelchair Mobility    Modified Rankin (Stroke Patients Only)       Balance Overall balance assessment: Mild deficits observed, not formally tested                                           Pertinent Vitals/Pain Faces Pain Scale: Hurts worst Pain Location: RLE Pain Intervention(s): Monitored during session;Limited activity within patient's tolerance;Repositioned;Premedicated before session    Home Living Family/patient expects to be discharged to:: Private residence Living Arrangements: Children Available Help at Discharge: Family Type of Home: House Home Access: Stairs to enter Entrance Stairs-Rails: Psychiatric nurse of Steps: 5 Home Layout: One level Home Equipment: [place] - single point Additional Comments: lives with daughter has aide every day for a few hours    Prior Function     Gait / Transfers Assistance Needed: uses cane for mobility  ADL's / Homemaking Assistance Needed: pt states he can dress and bathe on his own but has an aide for bathing, dressing, cooking        Hand Dominance   Dominant Hand: Right    Extremity/Trunk Assessment   Upper Extremity Assessment Upper Extremity Assessment: Overall WFL for tasks assessed  Lower Extremity Assessment Lower Extremity Assessment: Overall WFL for tasks assessed    Cervical / Trunk Assessment Cervical / Trunk Assessment: Kyphotic  Communication   Communication: No difficulties  Cognition Arousal/Alertness: Awake/alert Behavior During Therapy: WFL for tasks assessed/performed Overall Cognitive Status: Within Functional Limits for tasks assessed                           Safety/Judgement: Decreased awareness of safety            General Comments       Exercises     Assessment/Plan    PT Assessment Patent does not need any further PT services  PT Problem List Decreased activity tolerance;Decreased balance       PT Treatment Interventions      PT Goals (Current goals can be found in the Care Plan section)  Acute Rehab PT Goals PT Goal Formulation: All assessment and education complete, DC therapy    Frequency     Barriers to discharge        Co-evaluation               AM-PAC PT "6 Clicks" Daily Activity  Outcome Measure Difficulty turning over in bed (including adjusting bedclothes, sheets and blankets)?: None Difficulty moving from lying on back to sitting on the side of the bed? : None Difficulty sitting down on and standing up from a chair with arms (e.g., wheelchair, bedside commode, etc,.)?: A Little Help needed moving to and from a bed to chair (including a wheelchair)?: None Help needed walking in hospital room?: None Help needed climbing 3-5 steps with a railing? : None 6 Click Score: 23    End of Session Equipment Utilized During Treatment: Gait belt Activity Tolerance: Patient tolerated treatment well Patient left: in bed;with call bell/phone within reach Nurse Communication: Mobility status PT Visit Diagnosis: Other abnormalities of gait and mobility (R26.89)    Time: 2542-7062 PT Time Calculation (min) (ACUTE ONLY): 17 min   Charges:   PT Evaluation $PT Eval Low Complexity: [place], PT Acute Rehabilitation Services Pager: [phone] Office: [place] 10/22/2017, 1:46 PM

## 2017-10-22 NOTE — Plan of Care (Signed)
  Problem: Pain Managment: Goal: General experience of comfort will improve Outcome: Not Progressing  Still reliant on IV opioids for pain relief.

## 2017-10-23 LAB — CREATININE, SERUM: CREATININE: 1.01 mg/dL (ref 0.61–1.24)

## 2017-10-23 LAB — CBC
HEMATOCRIT: 34 % — AB (ref 39.0–52.0)
HEMOGLOBIN: 9.8 g/dL — AB (ref 13.0–17.0)
MCH: 22.1 pg — ABNORMAL LOW (ref 26.0–34.0)
MCHC: 28.8 g/dL — ABNORMAL LOW (ref 30.0–36.0)
MCV: 76.6 fL — ABNORMAL LOW (ref 80.0–100.0)
Platelets: 481 10*3/uL — ABNORMAL HIGH (ref 150–400)
RBC: 4.44 MIL/uL (ref 4.22–5.81)
RDW: 19.6 % — ABNORMAL HIGH (ref 11.5–15.5)
WBC: 9.4 10*3/uL (ref 4.0–10.5)
nRBC: 0 % (ref 0.0–0.2)

## 2017-10-23 MED ORDER — HEPARIN SODIUM (PORCINE) 5000 UNIT/ML IJ SOLN
5000.0000 [IU] | Freq: Three times a day (TID) | INTRAMUSCULAR | Status: DC
  Administered 2017-10-23 – 2017-10-27 (×13): 5000 [IU] via SUBCUTANEOUS
  Filled 2017-10-23 (×13): qty 1

## 2017-10-23 MED ORDER — OXYCODONE HCL 5 MG PO TABS
20.0000 mg | ORAL_TABLET | Freq: Four times a day (QID) | ORAL | Status: DC | PRN
  Administered 2017-10-23 – 2017-10-27 (×13): 20 mg via ORAL
  Filled 2017-10-23 (×13): qty 4

## 2017-10-23 MED ORDER — SODIUM CHLORIDE 0.9 % IV SOLN
1.0000 g | Freq: Three times a day (TID) | INTRAVENOUS | Status: DC
  Administered 2017-10-23 – 2017-10-27 (×13): 1 g via INTRAVENOUS
  Filled 2017-10-23 (×14): qty 1

## 2017-10-23 NOTE — Progress Notes (Signed)
  Progress Note    10/23/2017 10:45 AM 3 Days Post-Op  Subjective: Complains of severe pain around his knee but no right foot pain  Vitals:   10/23/17 0007 10/23/17 0512  BP: 123/63 109/68  Pulse: 90 91  Resp: (!) 30 12  Temp: 97.7 F (36.5 C) 97.6 F (36.4 C)  SpO2: 95% 100%    Physical Exam: Awake alert oriented x3 complaining of pain Right leg lateral knee incision without any active drainage Right foot is cool to touch there is a peroneal signal on the ankle only  CBC    Component Value Date/Time   WBC 9.5 10/18/2017 0306   RBC 4.78 10/18/2017 0306   HGB 10.7 (L) 10/18/2017 0306   HGB CANCELED 10/15/2017 0000   HCT 35.4 (L) 10/18/2017 0306   HCT CANCELED 10/15/2017 0000   PLT 330 10/18/2017 0306   PLT CANCELED 10/15/2017 0000   MCV 74.1 (L) 10/18/2017 0306   MCV 78 (L) 08/30/2017 1417   MCH 22.4 (L) 10/18/2017 0306   MCHC 30.2 10/18/2017 0306   RDW 19.5 (H) 10/18/2017 0306   RDW 17.9 (H) 08/30/2017 1417   LYMPHSABS 3.2 10/18/2017 0306   LYMPHSABS CANCELED 10/15/2017 0000   MONOABS 0.9 10/18/2017 0306   EOSABS 0.2 10/18/2017 0306   EOSABS CANCELED 10/15/2017 0000   BASOSABS 0.1 10/18/2017 0306   BASOSABS CANCELED 10/15/2017 0000    BMET    Component Value Date/Time   NA 132 (L) 10/18/2017 0306   NA 132 (L) 10/15/2017 0000   K 4.7 10/18/2017 0306   CL 100 10/18/2017 0306   CO2 23 10/18/2017 0306   GLUCOSE 112 (H) 10/18/2017 0306   BUN 35 (H) 10/18/2017 0306   BUN 17 10/15/2017 0000   CREATININE 1.11 10/18/2017 0306   CREATININE 0.85 05/10/2014 1114   CALCIUM 9.1 10/18/2017 0306   CALCIUM 9.1 10/28/2009 2118   GFRNONAA >60 10/18/2017 0306   GFRNONAA >89 02/01/2014 1518   GFRAA >60 10/18/2017 0306   GFRAA >89 02/01/2014 1518    INR    Component Value Date/Time   INR 1.17 10/08/2017 1850     Intake/Output Summary (Last 24 hours) at 10/23/2017 1045 Last data filed at 10/23/2017 0524 Gross per 24 hour  Intake 927.69 ml  Output 600 ml    Net 327.69 ml     Assessment:  57 y.o. male is s/p ligation of right lower extremity bypass graft Plan: Continue wound care with wet-to-dry dressings Currently on Ancef we will tailor antibiotics when sensitivities are back Subcutaneous heparin for DVT prophylaxis    Sidrah Harden C. Donzetta Matters, MD [facility] [place] Office: [phone] Pager: [phone]  10/23/2017 10:45 AM
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
## 2017-10-23 NOTE — Progress Notes (Signed)
OT Cancellation Note  Patient Details Name: [name] MRN: [id number] DOB: 05/19/1960   Cancelled Treatment:    Reason Eval/Treat Not Completed: OT screened, no needs identified, will sign off; Pt mobilizing well with PT, spoke with pt and pt reporting feeling at his baseline with ADLs and able to perform without assist, reports he also has an aide who can assist PRN at home. Spoke with NT and also in agreement regarding pt status. OT to sign off at this time as no further acute needs identified. Please re-consult if pt needs change. Thank you for this referral.   Lou Cal, OT [facility] Pager [phone] Office [phone]   Raymondo Band 10/23/2017, 10:29 AM

## 2017-10-24 NOTE — Progress Notes (Signed)
Pt sitting in side of bed, holding his right knee. Prn pain medication administered. Increased Right leg swelling, instructed pt to lay back in bed and prop his leg up. Pt refused and stated he has been sleeping like that for years. Reeducated pt that elevating his leg will help swelling, pt stated he understands but it hurts too much to lay back. Call bell within reach, will continue to monitor.

## 2017-10-24 NOTE — Progress Notes (Signed)
Pt stated he is ready to go home so he can use his Fentanyl. When asked where her gets Fentanyl from he stated "off the streets".   RN in room every 2hours to administer pain medication. Pt still verbalizes pain is not controlled and that if he was at home he wouldn't be dealing with this pain.  [place] that [place] would rather live there than to go to a facility.  RN provided reassurance and will continue to monitor.  Arnell Sieving, RN, BSN

## 2017-10-24 NOTE — Progress Notes (Signed)
  Progress Note    10/24/2017 10:57 AM 4 Days Post-Op  Subjective: Continues to complain of pain  Vitals:   10/24/17 0510 10/24/17 0738  BP: (!) 141/82   Pulse: 84   Resp: 14 (!) 22  Temp: 97.7 F (36.5 C)   SpO2: 97%     Physical Exam: Right lateral leg wound with clean base Foot appears perfused and sensation is intact There is a weak peroneal signal on the right  CBC    Component Value Date/Time   WBC 9.4 10/23/2017 1107   RBC 4.44 10/23/2017 1107   HGB 9.8 (L) 10/23/2017 1107   HGB CANCELED 10/15/2017 0000   HCT 34.0 (L) 10/23/2017 1107   HCT CANCELED 10/15/2017 0000   PLT 481 (H) 10/23/2017 1107   PLT CANCELED 10/15/2017 0000   MCV 76.6 (L) 10/23/2017 1107   MCV 78 (L) 08/30/2017 1417   MCH 22.1 (L) 10/23/2017 1107   MCHC 28.8 (L) 10/23/2017 1107   RDW 19.6 (H) 10/23/2017 1107   RDW 17.9 (H) 08/30/2017 1417   LYMPHSABS 3.2 10/18/2017 0306   LYMPHSABS CANCELED 10/15/2017 0000   MONOABS 0.9 10/18/2017 0306   EOSABS 0.2 10/18/2017 0306   EOSABS CANCELED 10/15/2017 0000   BASOSABS 0.1 10/18/2017 0306   BASOSABS CANCELED 10/15/2017 0000    BMET    Component Value Date/Time   NA 132 (L) 10/18/2017 0306   NA 132 (L) 10/15/2017 0000   K 4.7 10/18/2017 0306   CL 100 10/18/2017 0306   CO2 23 10/18/2017 0306   GLUCOSE 112 (H) 10/18/2017 0306   BUN 35 (H) 10/18/2017 0306   BUN 17 10/15/2017 0000   CREATININE 1.01 10/23/2017 1107   CREATININE 0.85 05/10/2014 1114   CALCIUM 9.1 10/18/2017 0306   CALCIUM 9.1 10/28/2009 2118   GFRNONAA >60 10/23/2017 1107   GFRNONAA >89 02/01/2014 1518   GFRAA >60 10/23/2017 1107   GFRAA >89 02/01/2014 1518    INR    Component Value Date/Time   INR 1.17 10/08/2017 1850     Intake/Output Summary (Last 24 hours) at 10/24/2017 1057 Last data filed at 10/24/2017 0516 Gross per 24 hour  Intake 640 ml  Output 2100 ml  Net -1460 ml     Assessment:  57 y.o. male is s/p ligation of right lower extremity bypass  graft  Plan: Continue dressing changes right lower extremity Antibiotics have been converted to cefepime for better coverage.  [name] Matters, MD [facility] [place] Office: [phone] Pager: [phone]  10/24/2017 10:57 AM

## 2017-10-25 LAB — CREATININE, SERUM: CREATININE: 0.84 mg/dL (ref 0.61–1.24)

## 2017-10-25 LAB — AEROBIC/ANAEROBIC CULTURE W GRAM STAIN (SURGICAL/DEEP WOUND)

## 2017-10-25 LAB — AEROBIC/ANAEROBIC CULTURE (SURGICAL/DEEP WOUND)

## 2017-10-25 NOTE — Progress Notes (Signed)
  Progress Note    10/25/2017 11:08 AM 5 Days Post-Op  Subjective: Still having pain in right leg  Vitals:   10/25/17 0002 10/25/17 0645  BP: 108/67 (!) 129/97  Pulse: (!) 104 (!) 166  Resp: 20   Temp: 97.8 F (36.6 C) 98.2 F (36.8 C)  SpO2: 94% 95%    Physical Exam: Awake alert and oriented Right leg with dressing clean dry intact Foot appears viable at this time.  CBC    Component Value Date/Time   WBC 9.4 10/23/2017 1107   RBC 4.44 10/23/2017 1107   HGB 9.8 (L) 10/23/2017 1107   HGB CANCELED 10/15/2017 0000   HCT 34.0 (L) 10/23/2017 1107   HCT CANCELED 10/15/2017 0000   PLT 481 (H) 10/23/2017 1107   PLT CANCELED 10/15/2017 0000   MCV 76.6 (L) 10/23/2017 1107   MCV 78 (L) 08/30/2017 1417   MCH 22.1 (L) 10/23/2017 1107   MCHC 28.8 (L) 10/23/2017 1107   RDW 19.6 (H) 10/23/2017 1107   RDW 17.9 (H) 08/30/2017 1417   LYMPHSABS 3.2 10/18/2017 0306   LYMPHSABS CANCELED 10/15/2017 0000   MONOABS 0.9 10/18/2017 0306   EOSABS 0.2 10/18/2017 0306   EOSABS CANCELED 10/15/2017 0000   BASOSABS 0.1 10/18/2017 0306   BASOSABS CANCELED 10/15/2017 0000    BMET    Component Value Date/Time   NA 132 (L) 10/18/2017 0306   NA 132 (L) 10/15/2017 0000   K 4.7 10/18/2017 0306   CL 100 10/18/2017 0306   CO2 23 10/18/2017 0306   GLUCOSE 112 (H) 10/18/2017 0306   BUN 35 (H) 10/18/2017 0306   BUN 17 10/15/2017 0000   CREATININE 0.84 10/25/2017 0622   CREATININE 0.85 05/10/2014 1114   CALCIUM 9.1 10/18/2017 0306   CALCIUM 9.1 10/28/2009 2118   GFRNONAA >60 10/25/2017 0622   GFRNONAA >89 02/01/2014 1518   GFRAA >60 10/25/2017 0622   GFRAA >89 02/01/2014 1518    INR    Component Value Date/Time   INR 1.17 10/08/2017 1850     Intake/Output Summary (Last 24 hours) at 10/25/2017 1108 Last data filed at 10/25/2017 0900 Gross per 24 hour  Intake 560 ml  Output 750 ml  Net -190 ml     Assessment:  57 y.o. male is s/p ligation of right lower semi-bypass now with  wound that is causing significant pain but his foot appears intact from neuro standpoint with minimal blood flow  Plan: Dressing changes per nursing staff Continue antibiotics Ongoing discussion regarding discharge with wound care versus primary above-knee amputation on the right.   Kashonda Sarkisyan C. Donzetta Matters, MD [facility] [place] Office: [phone] Pager: [phone]  10/25/2017 11:08 AM

## 2017-10-25 NOTE — Progress Notes (Signed)
Pt was having diarrhea multipal times after episode of constipation and no BM for a week. HR went up to 166 and sustained for 5-10 min while he was having BM. No s/s of distress. Denied cp, vitals stable. Prn iv metoprolol was administered. 10 -15 min after prn meds, HR came down to 90's Sinus rhythm. Pt kept asking for imodium. Reported to day shift. Pt has been asking for pain medication q2hr, requesting to increase his pain medication dose bc its not helping much. Non compliant. Will continue to monitor.

## 2017-10-25 NOTE — Progress Notes (Signed)
Pt appears frustrated. Concerned with amount and type of pain medication allowed. Discussed MD orders. Encouraged patient to be patient and allow medication to work. REviewed upcoming next dose. Pt threatening AMA. Explained benefits of staying at hospital to receive appropriate care. Requested pt not be so disrespectful to staff.  Pt has agreed to stay at this time. Repositioned pt. Provided support. Call bell in reach. INstructed pt to use call bell with any needs and not to move around without someone to assist. Pt verbalized understanding. --JM

## 2017-10-26 MED ORDER — FLEET ENEMA 7-19 GM/118ML RE ENEM
1.0000 | ENEMA | Freq: Two times a day (BID) | RECTAL | Status: DC | PRN
  Administered 2017-10-26 – 2017-10-27 (×2): 1 via RECTAL
  Filled 2017-10-26 (×2): qty 1

## 2017-10-26 MED ORDER — DOCUSATE SODIUM 100 MG PO CAPS
200.0000 mg | ORAL_CAPSULE | Freq: Two times a day (BID) | ORAL | Status: DC | PRN
  Administered 2017-10-26 – 2017-10-27 (×3): 200 mg via ORAL
  Filled 2017-10-26 (×3): qty 2

## 2017-10-26 NOTE — Progress Notes (Addendum)
Patient has been freq going to br w. Small diarrhea stool Check for impaction Patient is impacted has hard stool in rectum. We need orders for disimpacting patient

## 2017-10-26 NOTE — Progress Notes (Addendum)
Progress Note    10/26/2017 7:45 AM 6 Days Post-Op  Subjective:  Impacted this morning with some diarrhea   Vitals:   10/25/17 2356 10/26/17 0403  BP: (!) 158/73 (!) 155/78  Pulse: (!) 118 (!) 106  Resp:  18  Temp: (!) 97.4 F (36.3 C) 98 F (36.7 C)  SpO2: 100% 100%   Physical Exam: Lungs:  Non labored Incisions:  R thigh incision c/d/i; wound bed of lateral knee surgical wound with exposed fascia however granulating well with healthy wound bed Extremities:  Edematous R foot; cool to touch, no pedal signals by doppler Abdomen:  Soft Neurologic: A&O  CBC    Component Value Date/Time   WBC 9.4 10/23/2017 1107   RBC 4.44 10/23/2017 1107   HGB 9.8 (L) 10/23/2017 1107   HGB CANCELED 10/15/2017 0000   HCT 34.0 (L) 10/23/2017 1107   HCT CANCELED 10/15/2017 0000   PLT 481 (H) 10/23/2017 1107   PLT CANCELED 10/15/2017 0000   MCV 76.6 (L) 10/23/2017 1107   MCV 78 (L) 08/30/2017 1417   MCH 22.1 (L) 10/23/2017 1107   MCHC 28.8 (L) 10/23/2017 1107   RDW 19.6 (H) 10/23/2017 1107   RDW 17.9 (H) 08/30/2017 1417   LYMPHSABS 3.2 10/18/2017 0306   LYMPHSABS CANCELED 10/15/2017 0000   MONOABS 0.9 10/18/2017 0306   EOSABS 0.2 10/18/2017 0306   EOSABS CANCELED 10/15/2017 0000   BASOSABS 0.1 10/18/2017 0306   BASOSABS CANCELED 10/15/2017 0000    BMET    Component Value Date/Time   NA 132 (L) 10/18/2017 0306   NA 132 (L) 10/15/2017 0000   K 4.7 10/18/2017 0306   CL 100 10/18/2017 0306   CO2 23 10/18/2017 0306   GLUCOSE 112 (H) 10/18/2017 0306   BUN 35 (H) 10/18/2017 0306   BUN 17 10/15/2017 0000   CREATININE 0.84 10/25/2017 0622   CREATININE 0.85 05/10/2014 1114   CALCIUM 9.1 10/18/2017 0306   CALCIUM 9.1 10/28/2009 2118   GFRNONAA >60 10/25/2017 0622   GFRNONAA >89 02/01/2014 1518   GFRAA >60 10/25/2017 0622   GFRAA >89 02/01/2014 1518    INR    Component Value Date/Time   INR 1.17 10/08/2017 1850     Intake/Output Summary (Last 24 hours) at 10/26/2017  0745 Last data filed at 10/26/2017 0505 Gross per 24 hour  Intake 620 ml  Output 500 ml  Net 120 ml     Assessment/Plan:  57 y.o. male is s/p ligation of RLE bypass 6 Days Post-Op   R foot viable despite ligated bypass Surgical wound currently healing well; continue current wound care D/c IV pain medication Enema and stool softener ordered due to impaction D/c likely when disimpacted    Dagoberto Ligas, PA-C [facility] [phone] 10/26/2017 7:45 AM  I have examined the patient, reviewed and agree with above.  Very difficult management issues.  Virtually his right lateral wound continues to heal.  He does have good granulating base.  There is a small central area of exposed fascia which appears to be healing.  Pain somewhat better today.  I had a frank discussion with the patient explaining that he cannot continue to be in the hospital receiving IV Dilaudid every 2 hours for pain.  He has a long history of narcotic addiction.  Has refused consideration of outpatient placement.  Will plan disimpaction today.  Explained to him that we will switch from IV pain meds to oral pain meds today with plans to discharge in  several days with home health local wound care.  Curt Jews, MD 10/26/2017 11:37 AM

## 2017-10-26 NOTE — Progress Notes (Signed)
Adult fleet enema given. Pt successfully had bowel movement couple times with moderate amount of hard stool like golf ball size. Will monitor.  Kennyth Lose, RN

## 2017-10-27 MED ORDER — OXYCODONE-ACETAMINOPHEN 10-325 MG PO TABS: 1.0000 | ORAL_TABLET | Freq: Four times a day (QID) | ORAL | 0 refills | Status: DC | PRN

## 2017-10-27 NOTE — Progress Notes (Signed)
Pt discharged from hospital at 03:57 pm. Peripheral IV removed. Dressing on his right leg changed. AVS put in envelope given to Pt. Discharged instructions and med prescriptions given. He had fully understanding and had no question at this time. All of Pt belongings given back to his son. Pt leaved the unit with a staff nurse accompany via wheelchair.   Kennyth Lose, RN

## 2017-10-27 NOTE — Progress Notes (Addendum)
  Progress Note    10/27/2017 8:52 AM 7 Days Post-Op  Subjective:  Having abd cramps this morning.  Says he had a large bowel movement yesterday.   Vitals:   10/27/17 0327 10/27/17 0759  BP: (!) 142/75 136/73  Pulse: 94 (!) 101  Resp: 16 17  Temp: 98.5 F (36.9 C) 98.2 F (36.8 C)  SpO2: 100%    Physical Exam: Lungs:  Non labored Incisions:  Dressing left in place today Extremities:  R foot cool to touch compared to L; no active tissue ischemia R foot Abdomen:  Soft, diffusely tender  Neurologic: A&O  CBC    Component Value Date/Time   WBC 9.4 10/23/2017 1107   RBC 4.44 10/23/2017 1107   HGB 9.8 (L) 10/23/2017 1107   HGB CANCELED 10/15/2017 0000   HCT 34.0 (L) 10/23/2017 1107   HCT CANCELED 10/15/2017 0000   PLT 481 (H) 10/23/2017 1107   PLT CANCELED 10/15/2017 0000   MCV 76.6 (L) 10/23/2017 1107   MCV 78 (L) 08/30/2017 1417   MCH 22.1 (L) 10/23/2017 1107   MCHC 28.8 (L) 10/23/2017 1107   RDW 19.6 (H) 10/23/2017 1107   RDW 17.9 (H) 08/30/2017 1417   LYMPHSABS 3.2 10/18/2017 0306   LYMPHSABS CANCELED 10/15/2017 0000   MONOABS 0.9 10/18/2017 0306   EOSABS 0.2 10/18/2017 0306   EOSABS CANCELED 10/15/2017 0000   BASOSABS 0.1 10/18/2017 0306   BASOSABS CANCELED 10/15/2017 0000    BMET    Component Value Date/Time   NA 132 (L) 10/18/2017 0306   NA 132 (L) 10/15/2017 0000   K 4.7 10/18/2017 0306   CL 100 10/18/2017 0306   CO2 23 10/18/2017 0306   GLUCOSE 112 (H) 10/18/2017 0306   BUN 35 (H) 10/18/2017 0306   BUN 17 10/15/2017 0000   CREATININE 0.84 10/25/2017 0622   CREATININE 0.85 05/10/2014 1114   CALCIUM 9.1 10/18/2017 0306   CALCIUM 9.1 10/28/2009 2118   GFRNONAA >60 10/25/2017 0622   GFRNONAA >89 02/01/2014 1518   GFRAA >60 10/25/2017 0622   GFRAA >89 02/01/2014 1518    INR    Component Value Date/Time   INR 1.17 10/08/2017 1850     Intake/Output Summary (Last 24 hours) at 10/27/2017 0852 Last data filed at 10/26/2017 2335 Gross per 24  hour  Intake 730 ml  Output 1020 ml  Net -290 ml     Assessment/Plan:  57 y.o. male is s/p ligation of RLE bypass 7 Days Post-Op   R foot viable despite ligated bypass Patient refused dressing change this morning; nursing staff will change wet to dry later today Case management to arrange home health for dressing changes Abd pain; he would like another enema this morning D/c likely when [facility] arranged    Dagoberto Ligas, PA-C [facility] [phone] 10/27/2017 8:52 AM  I have examined the patient, reviewed and agree with above.States he is ready for d/c.  Home health for dressing changes.  Percocet10/325 # 30 for pain.  F/u in 2 weeks  Curt Jews, MD 10/27/2017 2:42 PM

## 2017-10-27 NOTE — Care Management Note (Signed)
Case Management Note Marvetta Gibbons RN, BSN Transitions of Care Unit 4E- RN Case Manager [phone]  Patient Details  Name: [name] MRN: [id number] Date of Birth: 05/22/1960  Subjective/Objective:    Pt admitted with infected graft, s/p I&D of graft, hx IVDU                 Action/Plan: PTA pt lived at home, concerns about possible homelessness- CSW consulted. Per PT note pt reports that he will be staying with daughter. Pt has PCS aide daily.  Followed by [facility] Pt has active Medicaid for medication needs.  Per PT no recommendations for f/u transition needs. Pt ambulating independently on unit. CM will follow for transition of care needs-   Expected Discharge Date:                  Expected Discharge Plan:  [place]  In-House Referral:  Clinical Social Work  Discharge planning Services  CM Consult  Post Acute Care Choice:  Home Health Choice offered to:  Patient  DME Arranged:    DME Agency:     HH Arranged:  RN, Nurse's Aide [place] Agency:  [place]  Status of Service:  Completed, signed off  If discussed at [place] of Stay Meetings, dates discussed:    Discharge Disposition: home/home health   Additional Comments:  10/27/17- 1200- Marvetta Gibbons RN, CM- orders placed for HHRN/aide- wound care needs- spoke with pt at bedside to discuss transition of care plans- per pt he plans to go to daughter's home in [place]- address is [address]. [phone], phone# [phone] Discussed HH needs-choice offered for [facility] agency- pt has used [facility] in past- and wants to use them again for services. Referral called to Butch Penny with [facility] for [facility] needs.   [name] Patricia, RN 10/27/2017, 12:22 PM

## 2017-10-27 NOTE — Discharge Summary (Signed)
Physician Discharge Summary   Patient ID: [name] [id number] 57 y.o. 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  He has undergone several revisions to this bypass graft despite having been told that there are no further options to revise bypass to be continues to inject into his graft and he will require an above-the-knee amputation.  He was started on broad-spectrum antibiotics.  After several days in the hospital area of infection and swelling of lateral right knee began to increase in size.  He was brought urgently to the operating room and was found to have a ruptured bypass conduit thus requiring ligation of bypass graft by Dr. Donnetta Hutching on 10/20/2017.  Postoperatively he was kept hospitalized to monitor the viability of his right leg having had his bypass ligated.  We reviewed the high risk of an above-the-knee amputation with [name] however he is not willing to consent at this time.  This is a reasonable option at this time as throughout the course of this hospital stay his right foot has remained visibly viable with no active tissue ischemia.  Over the course of a few days he was transitioned from IV to p.o. pain medication.  This took longer due to his narcotic addiction.  Case management and clinical social worker were also consulted for potential placement however patient would like to live with his daughter for  the foreseeable future as his daughter is agreeable to this arrangement.  Case management did however arrange home health services for complex wound care involving wet-to-dry dressing changes to the open surgical wound of his right lateral knee.  He will follow-up to see Dr. Donnetta Hutching in 2 weeks.  He will be discharged with number #30 10/325 mg Percocet.  Discharge instructions were reviewed with the patient and he voices his understanding.  He will be discharged to home with home health this afternoon in stable condition.  Consults: None  Treatments: surgery: exploration of tunneled RLE bypass tract with ligation of bypass  Discharge Exam: see progress note 10/27/17 Vitals:   10/27/17 0759 10/27/17 1207  BP: 136/73 136/90  Pulse: (!) 101 (!) 106  Resp: 17 (!) 21  Temp: 98.2 F (36.8 C) 97.7 F (36.5 C)  SpO2:  100%      Disposition: Discharge disposition: 01-Home or Self Care       Patient Instructions:  Allergies as of 10/27/2017      Reactions   Buprenorphine Hcl-naloxone Hcl Shortness Of Breath, Other (See Comments)   "Felt like I was going to die," was  jittery, had trouble breathing, felt hot (reaction to Suboxone) PER THE NCCSRS database, the patient received #42 Suboxone films between 03/26/17 and 04/02/17 from [facility]...(??)   Ciprofloxacin Anaphylaxis   Morphine And Related Other (See Comments)   Seizures   Morphine-naltrexone Other (See Comments)   Seizures   Shellfish-derived Products Anaphylaxis, Hives, Swelling   Benadryl [diphenhydramine Hcl] Hives, Itching, Rash   Fish Allergy Hives, Swelling, Rash  Vicodin [hydrocodone-acetaminophen] Nausea Only      Medication List    TAKE these medications   albuterol (2.5 MG/3ML) 0.083% nebulizer solution Commonly known as:  PROVENTIL Take 3 mLs (2.5 mg total) by nebulization every 6 (six) hours as needed for wheezing or shortness of breath.   albuterol 108 (90 Base) MCG/ACT inhaler Commonly known  as:  PROVENTIL HFA;VENTOLIN HFA Inhale 2 puffs into the lungs every 4 (four) hours as needed for wheezing or shortness of breath.   aspirin EC 81 MG tablet Take 1 tablet (81 mg total) by mouth daily for 21 days.   atorvastatin 80 MG tablet Commonly known as:  LIPITOR Take 1 tablet (80 mg total) by mouth daily at 6 PM.   clopidogrel 75 MG tablet Commonly known as:  PLAVIX Take 1 tablet (75 mg total) by mouth daily.   fluticasone 50 MCG/ACT nasal spray Commonly known as:  FLONASE Place 2 sprays into both nostrils daily.   hydrOXYzine 25 MG capsule Commonly known as:  VISTARIL Take 1 capsule (25 mg total) by mouth 2 (two) times daily. Itching   nicotine 21 mg/24hr patch Commonly known as:  NICODERM CQ - dosed in mg/24 hours Place 1 patch (21 mg total) onto the skin daily.   ondansetron 4 MG tablet Commonly known as:  ZOFRAN Take 1 tablet (4 mg total) by mouth every 8 (eight) hours as needed for nausea or vomiting.   oxyCODONE-acetaminophen 10-325 MG tablet Commonly known as:  PERCOCET Take 1 tablet by mouth every 6 (six) hours as needed for up to 30 doses for pain.   pantoprazole 20 MG tablet Commonly known as:  PROTONIX Take 1 tablet (20 mg total) by mouth daily before breakfast.   pregabalin 75 MG capsule Commonly known as:  LYRICA TAKE 1 CAPSULE BY MOUTH THREE TIMES A DAY      Activity: activity as tolerated Diet: regular diet Wound Care: wet to dry dressing changes daily with [facility]  Follow-up with Dr. Donnetta Hutching in 2 weeks.  SignedDagoberto Ligas 10/27/2017 2:49 PM

## 2017-10-28 ENCOUNTER — Other Ambulatory Visit: Payer: Self-pay | Admitting: Family Medicine

## 2017-10-28 ENCOUNTER — Telehealth: Payer: Self-pay | Admitting: Vascular Surgery

## 2017-10-28 ENCOUNTER — Telehealth: Payer: Self-pay | Admitting: Family Medicine

## 2017-10-28 DIAGNOSIS — E119 Type 2 diabetes mellitus without complications: Secondary | ICD-10-CM

## 2017-10-28 MED ORDER — ACCU-CHEK AVIVA DEVI: 0 refills | Status: DC

## 2017-10-28 MED ORDER — GLUCOSE BLOOD VI STRP: ORAL_STRIP | 3 refills | Status: DC

## 2017-10-28 MED ORDER — ACCU-CHEK SOFT TOUCH LANCETS MISC: 3 refills | Status: DC

## 2017-10-28 NOTE — Telephone Encounter (Signed)
[name] with [facility] called to inform that patient has not been taking his routine medication but informed [name] he would pick them up today. He has his plavix and atorvastin. Patient also needs a glucose monitor.

## 2017-10-28 NOTE — Telephone Encounter (Signed)
-----   Message from Mena Goes, RN sent at 10/27/2017  2:53 PM EDT ----- Regarding: 2 weeks postop with Dr. Donnetta Hutching   ----- Message ----- From: Iline Oven Sent: 10/27/2017   2:44 PM EDT To: [facility] Admin [facility], [facility] Charge [facility]   Can you schedule an appt for this pt with Dr. Donnetta Hutching in about 2 weeks.  PO ligated R LE bypass. Thanks, [facility]

## 2017-10-28 NOTE — Telephone Encounter (Signed)
Called, no answer, lvm to return call about which pharmacy. If the patient returns call, please ask patient which pharmacy does he prefer.

## 2017-10-28 NOTE — Telephone Encounter (Signed)
Patient appears to have Medicaid which covers the Accucheck Aviva glucometer and supplies. Which pharmacy would be like for this to be sent to

## 2017-10-28 NOTE — Progress Notes (Signed)
Patient ID: [name], male   DOB: 13-Jul-1960, 57 y.o.   MRN: [id number]   See recent phone encounter.  Patient's home care company called to report that patient needs prescription for glucometer and testing supplies sent to pharmacy. [facility] preferred and supplies for once daily testing sent to that pharmacy. Most recent Hgb A1c in chart was 6.5.

## 2017-10-28 NOTE — Telephone Encounter (Signed)
Patient called and says he would like for it to be sent to [facility] pharmacy

## 2017-10-28 NOTE — Telephone Encounter (Signed)
Please notify patient and home health that glucometer and supplies for once daily testing of blood sugar have been sent to [facility] pharmacy

## 2017-10-28 NOTE — Telephone Encounter (Signed)
sch appt lvm 11/09/17 1030am p/o MD

## 2017-10-29 ENCOUNTER — Other Ambulatory Visit: Payer: Self-pay | Admitting: Vascular Surgery

## 2017-10-29 NOTE — Telephone Encounter (Signed)
Patient was called, verified dob, and was informed of pcp note. Patient stated his son would be picking them up today, they were not ready for pick up yesterday.

## 2017-11-01 ENCOUNTER — Inpatient Hospital Stay (HOSPITAL_COMMUNITY)
Admission: EM | Admit: 2017-11-01 | Discharge: 2017-11-06 | DRG: 239 | Disposition: A | Discharge status: Home Health | Payer: Medicaid Other | Attending: Vascular Surgery | Admitting: Vascular Surgery

## 2017-11-01 ENCOUNTER — Other Ambulatory Visit: Payer: Self-pay

## 2017-11-01 DIAGNOSIS — Z8614 Personal history of Methicillin resistant Staphylococcus aureus infection: Secondary | ICD-10-CM | POA: Diagnosis not present

## 2017-11-01 DIAGNOSIS — Z79891 Long term (current) use of opiate analgesic: Secondary | ICD-10-CM

## 2017-11-01 DIAGNOSIS — Z87442 Personal history of urinary calculi: Secondary | ICD-10-CM | POA: Diagnosis not present

## 2017-11-01 DIAGNOSIS — I639 Cerebral infarction, unspecified: Secondary | ICD-10-CM

## 2017-11-01 DIAGNOSIS — G4730 Sleep apnea, unspecified: Secondary | ICD-10-CM | POA: Diagnosis present

## 2017-11-01 DIAGNOSIS — F1910 Other psychoactive substance abuse, uncomplicated: Secondary | ICD-10-CM | POA: Diagnosis present

## 2017-11-01 DIAGNOSIS — I998 Other disorder of circulatory system: Secondary | ICD-10-CM | POA: Diagnosis present

## 2017-11-01 DIAGNOSIS — Z681 Body mass index (BMI) 19 or less, adult: Secondary | ICD-10-CM | POA: Diagnosis not present

## 2017-11-01 DIAGNOSIS — L02415 Cutaneous abscess of right lower limb: Secondary | ICD-10-CM | POA: Diagnosis present

## 2017-11-01 DIAGNOSIS — K219 Gastro-esophageal reflux disease without esophagitis: Secondary | ICD-10-CM | POA: Diagnosis not present

## 2017-11-01 DIAGNOSIS — I110 Hypertensive heart disease with heart failure: Secondary | ICD-10-CM | POA: Diagnosis present

## 2017-11-01 DIAGNOSIS — I739 Peripheral vascular disease, unspecified: Secondary | ICD-10-CM | POA: Diagnosis present

## 2017-11-01 DIAGNOSIS — M069 Rheumatoid arthritis, unspecified: Secondary | ICD-10-CM | POA: Diagnosis not present

## 2017-11-01 DIAGNOSIS — E871 Hypo-osmolality and hyponatremia: Secondary | ICD-10-CM | POA: Diagnosis not present

## 2017-11-01 DIAGNOSIS — F17210 Nicotine dependence, cigarettes, uncomplicated: Secondary | ICD-10-CM | POA: Diagnosis not present

## 2017-11-01 DIAGNOSIS — Z95810 Presence of automatic (implantable) cardiac defibrillator: Secondary | ICD-10-CM

## 2017-11-01 DIAGNOSIS — Z79899 Other long term (current) drug therapy: Secondary | ICD-10-CM | POA: Diagnosis not present

## 2017-11-01 DIAGNOSIS — E1152 Type 2 diabetes mellitus with diabetic peripheral angiopathy with gangrene: Principal | ICD-10-CM | POA: Diagnosis present

## 2017-11-01 DIAGNOSIS — D62 Acute posthemorrhagic anemia: Secondary | ICD-10-CM | POA: Diagnosis not present

## 2017-11-01 DIAGNOSIS — I5032 Chronic diastolic (congestive) heart failure: Secondary | ICD-10-CM | POA: Diagnosis present

## 2017-11-01 DIAGNOSIS — J449 Chronic obstructive pulmonary disease, unspecified: Secondary | ICD-10-CM | POA: Diagnosis present

## 2017-11-01 DIAGNOSIS — G894 Chronic pain syndrome: Secondary | ICD-10-CM | POA: Diagnosis present

## 2017-11-01 DIAGNOSIS — F329 Major depressive disorder, single episode, unspecified: Secondary | ICD-10-CM | POA: Diagnosis not present

## 2017-11-01 DIAGNOSIS — I96 Gangrene, not elsewhere classified: Secondary | ICD-10-CM | POA: Diagnosis present

## 2017-11-01 DIAGNOSIS — Z888 Allergy status to other drugs, medicaments and biological substances status: Secondary | ICD-10-CM

## 2017-11-01 DIAGNOSIS — F1911 Other psychoactive substance abuse, in remission: Secondary | ICD-10-CM

## 2017-11-01 DIAGNOSIS — Z86711 Personal history of pulmonary embolism: Secondary | ICD-10-CM

## 2017-11-01 DIAGNOSIS — Z89611 Acquired absence of right leg above knee: Secondary | ICD-10-CM | POA: Diagnosis not present

## 2017-11-01 DIAGNOSIS — Z7982 Long term (current) use of aspirin: Secondary | ICD-10-CM

## 2017-11-01 DIAGNOSIS — Z8673 Personal history of transient ischemic attack (TIA), and cerebral infarction without residual deficits: Secondary | ICD-10-CM

## 2017-11-01 DIAGNOSIS — E43 Unspecified severe protein-calorie malnutrition: Secondary | ICD-10-CM | POA: Diagnosis present

## 2017-11-01 DIAGNOSIS — M79673 Pain in unspecified foot: Secondary | ICD-10-CM

## 2017-11-01 DIAGNOSIS — E785 Hyperlipidemia, unspecified: Secondary | ICD-10-CM | POA: Diagnosis present

## 2017-11-01 DIAGNOSIS — Z955 Presence of coronary angioplasty implant and graft: Secondary | ICD-10-CM

## 2017-11-01 DIAGNOSIS — Z885 Allergy status to narcotic agent status: Secondary | ICD-10-CM

## 2017-11-01 DIAGNOSIS — Z7902 Long term (current) use of antithrombotics/antiplatelets: Secondary | ICD-10-CM

## 2017-11-01 DIAGNOSIS — Z91013 Allergy to seafood: Secondary | ICD-10-CM

## 2017-11-01 LAB — BASIC METABOLIC PANEL
ANION GAP: 6 (ref 5–15)
BUN: 19 mg/dL (ref 6–20)
CALCIUM: 9.1 mg/dL (ref 8.9–10.3)
CHLORIDE: 103 mmol/L (ref 98–111)
CO2: 26 mmol/L (ref 22–32)
Creatinine, Ser: 0.81 mg/dL (ref 0.61–1.24)
GFR calc Af Amer: >60 mL/min (ref >60)
GFR calc non Af Amer: >60 mL/min (ref >60)
GLUCOSE: 113 mg/dL — AB (ref 70–99)
POTASSIUM: 4.8 mmol/L (ref 3.5–5.1)
Sodium: 135 mmol/L (ref 135–145)

## 2017-11-01 LAB — CBC
HEMATOCRIT: 32.6 % — AB (ref 39.0–52.0)
Hemoglobin: 9.6 g/dL — ABNORMAL LOW (ref 13.0–17.0)
MCH: 22.7 pg — AB (ref 26.0–34.0)
MCHC: 29.4 g/dL — ABNORMAL LOW (ref 30.0–36.0)
MCV: 77.3 fL — ABNORMAL LOW (ref 80.0–100.0)
Platelets: 402 10*3/uL — ABNORMAL HIGH (ref 150–400)
RBC: 4.22 MIL/uL (ref 4.22–5.81)
RDW: 21.3 % — AB (ref 11.5–15.5)
WBC: 8.8 10*3/uL (ref 4.0–10.5)
nRBC: 0 % (ref 0.0–0.2)

## 2017-11-01 MED ORDER — SODIUM CHLORIDE 0.9 % IV BOLUS
500.0000 mL | Freq: Once | INTRAVENOUS | Status: AC
  Administered 2017-11-01: 500 mL via INTRAVENOUS

## 2017-11-01 MED ORDER — ONDANSETRON HCL 4 MG/2ML IJ SOLN
4.0000 mg | Freq: Three times a day (TID) | INTRAMUSCULAR | Status: AC | PRN
  Administered 2017-11-02: 4 mg via INTRAVENOUS
  Filled 2017-11-01 (×2): qty 2

## 2017-11-01 MED ORDER — HYDROMORPHONE HCL 1 MG/ML IJ SOLN
1.0000 mg | Freq: Once | INTRAMUSCULAR | Status: AC
  Administered 2017-11-01: 1 mg via INTRAVENOUS
  Filled 2017-11-01: qty 1

## 2017-11-01 MED ORDER — HYDROMORPHONE HCL 1 MG/ML IJ SOLN
1.0000 mg | INTRAMUSCULAR | Status: DC | PRN
  Administered 2017-11-01 – 2017-11-02 (×2): 1 mg via INTRAVENOUS
  Filled 2017-11-01 (×3): qty 1

## 2017-11-01 NOTE — ED Triage Notes (Signed)
Patient arrives via EMS with c/o right knee pain, hx wound infection to right knee. Patient seen in ED 10/18/17, plan for surgery this week for amputation. Pain worsened today.

## 2017-11-01 NOTE — ED Notes (Signed)
carelink called at this time.  

## 2017-11-01 NOTE — ED Notes (Signed)
Bed: WA06 Expected date:  Expected time:  Means of arrival:  Comments: 
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
## 2017-11-01 NOTE — ED Notes (Signed)
Bed: WA06 Expected date:  Expected time:  Means of arrival:  Comments: EMS leg infection- room 6?

## 2017-11-02 ENCOUNTER — Encounter (HOSPITAL_COMMUNITY): Payer: Self-pay

## 2017-11-02 LAB — BASIC METABOLIC PANEL
Anion gap: 10 (ref 5–15)
BUN: 16 mg/dL (ref 6–20)
CHLORIDE: 99 mmol/L (ref 98–111)
CO2: 24 mmol/L (ref 22–32)
CREATININE: 0.69 mg/dL (ref 0.61–1.24)
Calcium: 8.8 mg/dL — ABNORMAL LOW (ref 8.9–10.3)
GFR calc Af Amer: >60 mL/min (ref >60)
Glucose, Bld: 110 mg/dL — ABNORMAL HIGH (ref 70–99)
POTASSIUM: 4.3 mmol/L (ref 3.5–5.1)
Sodium: 133 mmol/L — ABNORMAL LOW (ref 135–145)

## 2017-11-02 LAB — CBC
HCT: 29.1 % — ABNORMAL LOW (ref 39.0–52.0)
Hemoglobin: 8.9 g/dL — ABNORMAL LOW (ref 13.0–17.0)
MCH: 22.9 pg — AB (ref 26.0–34.0)
MCHC: 30.6 g/dL (ref 30.0–36.0)
MCV: 75 fL — AB (ref 80.0–100.0)
PLATELETS: 376 10*3/uL (ref 150–400)
RBC: 3.88 MIL/uL — AB (ref 4.22–5.81)
RDW: 20.9 % — ABNORMAL HIGH (ref 11.5–15.5)
WBC: 8.8 10*3/uL (ref 4.0–10.5)
nRBC: 0 % (ref 0.0–0.2)

## 2017-11-02 LAB — GLUCOSE, CAPILLARY
Glucose-Capillary: 110 mg/dL — ABNORMAL HIGH (ref 70–99)
Glucose-Capillary: 106 mg/dL — ABNORMAL HIGH (ref 70–99)

## 2017-11-02 MED ORDER — ONDANSETRON HCL 4 MG/2ML IJ SOLN: 4.0000 mg | Freq: Four times a day (QID) | INTRAMUSCULAR | Status: DC | PRN

## 2017-11-02 MED ORDER — DIPHENHYDRAMINE HCL 12.5 MG/5ML PO ELIX: 12.5000 mg | ORAL_SOLUTION | Freq: Four times a day (QID) | ORAL | Status: DC | PRN

## 2017-11-02 MED ORDER — ADULT MULTIVITAMIN W/MINERALS CH
1.0000 | ORAL_TABLET | Freq: Every day | ORAL | Status: DC
  Administered 2017-11-02 – 2017-11-05 (×3): 1 via ORAL
  Filled 2017-11-02 (×4): qty 1

## 2017-11-02 MED ORDER — NALOXONE HCL 0.4 MG/ML IJ SOLN: 0.4000 mg | INTRAMUSCULAR | Status: DC | PRN

## 2017-11-02 MED ORDER — INFLUENZA VAC SPLIT QUAD 0.5 ML IM SUSY: 0.5000 mL | PREFILLED_SYRINGE | INTRAMUSCULAR | Status: DC | PRN

## 2017-11-02 MED ORDER — ENSURE ENLIVE PO LIQD
237.0000 mL | Freq: Three times a day (TID) | ORAL | Status: DC
  Administered 2017-11-02 – 2017-11-05 (×8): 237 mL via ORAL

## 2017-11-02 MED ORDER — SODIUM CHLORIDE 0.9 % IV SOLN
INTRAVENOUS | Status: DC
  Administered 2017-11-02: 01:00:00 via INTRAVENOUS

## 2017-11-02 MED ORDER — DIPHENHYDRAMINE HCL 50 MG/ML IJ SOLN: 12.5000 mg | Freq: Four times a day (QID) | INTRAMUSCULAR | Status: DC | PRN

## 2017-11-02 MED ORDER — ENSURE ENLIVE PO LIQD
237.0000 mL | Freq: Two times a day (BID) | ORAL | Status: DC
  Administered 2017-11-02: 237 mL via ORAL

## 2017-11-02 MED ORDER — SODIUM CHLORIDE 0.9% FLUSH: 9.0000 mL | INTRAVENOUS | Status: DC | PRN

## 2017-11-02 MED ORDER — CHLORHEXIDINE GLUCONATE 4 % EX LIQD: 60.0000 mL | Freq: Once | CUTANEOUS | Status: DC

## 2017-11-02 MED ORDER — CEFAZOLIN SODIUM-DEXTROSE 2-4 GM/100ML-% IV SOLN
2.0000 g | INTRAVENOUS | Status: AC
  Administered 2017-11-03: 2 g via INTRAVENOUS
  Filled 2017-11-02 (×2): qty 100

## 2017-11-02 MED ORDER — ONDANSETRON HCL 4 MG/2ML IJ SOLN
4.0000 mg | Freq: Four times a day (QID) | INTRAMUSCULAR | Status: DC | PRN
  Administered 2017-11-02 – 2017-11-05 (×3): 4 mg via INTRAVENOUS
  Filled 2017-11-02 (×3): qty 2

## 2017-11-02 MED ORDER — NICOTINE 21 MG/24HR TD PT24: 21.0000 mg | MEDICATED_PATCH | Freq: Every day | TRANSDERMAL | Status: DC

## 2017-11-02 MED ORDER — HYDROMORPHONE 1 MG/ML IV SOLN
INTRAVENOUS | Status: DC
  Administered 2017-11-02: 6.6 mg via INTRAVENOUS
  Administered 2017-11-02: 05:00:00 via INTRAVENOUS
  Administered 2017-11-03: 3.6 mg via INTRAVENOUS
  Administered 2017-11-03: 05:00:00 via INTRAVENOUS
  Administered 2017-11-03: 3.3 mg via INTRAVENOUS
  Administered 2017-11-03: 3 mg via INTRAVENOUS
  Administered 2017-11-03: 3.5 mg via INTRAVENOUS
  Administered 2017-11-04: 3.6 mg via INTRAVENOUS
  Administered 2017-11-04: 6.9 mg via INTRAVENOUS
  Administered 2017-11-04: 25 mg via INTRAVENOUS
  Administered 2017-11-04: 3.3 mg via INTRAVENOUS
  Administered 2017-11-04: 4.8 mg via INTRAVENOUS
  Administered 2017-11-05: 25 mg via INTRAVENOUS
  Administered 2017-11-05: 3.6 mg via INTRAVENOUS
  Administered 2017-11-05: 3 mg via INTRAVENOUS
  Administered 2017-11-06: 3.5 mg via INTRAVENOUS
  Administered 2017-11-06: 3.9 mg via INTRAVENOUS
  Filled 2017-11-02 (×5): qty 25

## 2017-11-02 MED ORDER — CHLORHEXIDINE GLUCONATE 4 % EX LIQD
60.0000 mL | Freq: Once | CUTANEOUS | Status: AC
  Administered 2017-11-02: 4 via TOPICAL

## 2017-11-02 MED ORDER — CHLORHEXIDINE GLUCONATE 4 % EX LIQD
60.0000 mL | Freq: Once | CUTANEOUS | Status: AC
  Administered 2017-11-03: 4 via TOPICAL
  Filled 2017-11-02: qty 60
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
## 2017-11-02 NOTE — Progress Notes (Signed)
Patient arrived on unit through [facility] and is A&Ox4. In pain. Right lower leg is red and swollen. Will give Pain medications and continue to monitor.
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
## 2017-11-02 NOTE — Anesthesia Preprocedure Evaluation (Addendum)
Anesthesia Evaluation  Patient identified by MRN, date of birth, ID band Patient awake    Reviewed: Allergy & Precautions, NPO status , Patient's Chart, lab work & pertinent test results  History of Anesthesia Complications Negative for: history of anesthetic complications  Airway Mallampati: II  TM Distance: >3 FB Neck ROM: Full    Dental no notable dental hx. (+) Edentulous Upper, Poor Dentition, Missing,    Pulmonary sleep apnea (noncompliant with CPAP) , COPD,  COPD inhaler, Current Smoker,    Pulmonary exam normal breath sounds clear to auscultation       Cardiovascular hypertension, Pt. on medications + Peripheral Vascular Disease  Normal cardiovascular exam Rhythm:Regular Rate:Normal     Neuro/Psych Anxiety Depression CVA    GI/Hepatic hiatal hernia, GERD  ,(+) Hepatitis -, C  Endo/Other  negative endocrine ROSdiabetes, Type 2  Renal/GU negative Renal ROS     Musculoskeletal  (+) Arthritis , Rheumatoid disorders,  Lumbar radiculopathy   Abdominal   Peds  Hematology negative hematology ROS (+) anemia , Hx of DVT/PE after surgery Hgb 8.9 on 11/02/17   Anesthesia Other Findings Day of surgery medications reviewed with the patient.  Reproductive/Obstetrics                            Anesthesia Physical Anesthesia Plan  ASA: IV  Anesthesia Plan: General   Post-op Pain Management: GA combined w/ Regional for post-op pain   Induction: Intravenous  PONV Risk Score and Plan: 1 and Ondansetron and Treatment may vary due to age or medical condition  Airway Management Planned: Oral ETT  Additional Equipment:   Intra-op Plan:   Post-operative Plan: Extubation in OR  Informed Consent: I have reviewed the patients History and Physical, chart, labs and discussed the procedure including the risks, benefits and alternatives for the proposed anesthesia with the patient or authorized  representative who has indicated his/her understanding and acceptance.   Dental advisory given  Plan Discussed with: CRNA and Surgeon  Anesthesia Plan Comments:        Anesthesia Quick Evaluation

## 2017-11-02 NOTE — Plan of Care (Signed)
  Problem: Education: Goal: Knowledge of General Education information will improve Description Including pain rating scale, medication(s)/side effects and non-pharmacologic comfort measures Outcome: Progressing   Problem: Clinical Measurements: Goal: Will remain free from infection Outcome: Progressing   Problem: Clinical Measurements: Goal: Cardiovascular complication will be avoided Outcome: Progressing   Problem: Activity: Goal: Risk for activity intolerance will decrease Outcome: Progressing   Problem: Elimination: Goal: Will not experience complications related to bowel motility Outcome: Progressing   Problem: Pain Managment: Goal: General experience of comfort will improve Outcome: Progressing   Problem: Safety: Goal: Ability to remain free from injury will improve Outcome: Progressing   Problem: Skin Integrity: Goal: Risk for impaired skin integrity will decrease Outcome: Progressing

## 2017-11-02 NOTE — Plan of Care (Signed)

## 2017-11-03 ENCOUNTER — Encounter (HOSPITAL_COMMUNITY): Payer: Self-pay | Admitting: *Deleted

## 2017-11-03 ENCOUNTER — Inpatient Hospital Stay (HOSPITAL_COMMUNITY): Payer: Medicaid Other | Admitting: Anesthesiology

## 2017-11-03 ENCOUNTER — Inpatient Hospital Stay (HOSPITAL_COMMUNITY): Admission: RE | Admit: 2017-11-03 | Payer: Medicaid Other | Source: Ambulatory Visit | Admitting: Vascular Surgery

## 2017-11-03 ENCOUNTER — Encounter (HOSPITAL_COMMUNITY)
Admission: EM | Disposition: A | Discharge status: Home Health | Payer: Self-pay | Source: Home / Self Care | Attending: Vascular Surgery

## 2017-11-03 DIAGNOSIS — I998 Other disorder of circulatory system: Secondary | ICD-10-CM

## 2017-11-03 HISTORY — PX: AMPUTATION: SHX166

## 2017-11-03 LAB — POCT I-STAT 4, (NA,K, GLUC, HGB,HCT)
Glucose, Bld: 132 mg/dL — ABNORMAL HIGH (ref 70–99)
HEMATOCRIT: 29 % — AB (ref 39.0–52.0)
HEMOGLOBIN: 9.9 g/dL — AB (ref 13.0–17.0)
Potassium: 4.5 mmol/L (ref 3.5–5.1)
SODIUM: 133 mmol/L — AB (ref 135–145)

## 2017-11-03 LAB — TYPE AND SCREEN
ABO/RH(D): A POS
ANTIBODY SCREEN: NEGATIVE

## 2017-11-03 LAB — GLUCOSE, CAPILLARY
Glucose-Capillary: 100 mg/dL — ABNORMAL HIGH (ref 70–99)
Glucose-Capillary: 119 mg/dL — ABNORMAL HIGH (ref 70–99)
Glucose-Capillary: 101 mg/dL — ABNORMAL HIGH (ref 70–99)

## 2017-11-03 SURGERY — AMPUTATION, ABOVE KNEE
Anesthesia: General | Site: Leg Upper | Laterality: Right

## 2017-11-03 MED ORDER — FENTANYL CITRATE (PF) 100 MCG/2ML IJ SOLN
25.0000 ug | INTRAMUSCULAR | Status: DC | PRN
  Administered 2017-11-03: 25 ug via INTRAVENOUS

## 2017-11-03 MED ORDER — PROPOFOL 10 MG/ML IV BOLUS
INTRAVENOUS | Status: DC | PRN
  Administered 2017-11-03: 130 mg via INTRAVENOUS

## 2017-11-03 MED ORDER — PROMETHAZINE HCL 25 MG/ML IJ SOLN: 6.2500 mg | INTRAMUSCULAR | Status: DC | PRN

## 2017-11-03 MED ORDER — CEFAZOLIN SODIUM-DEXTROSE 2-4 GM/100ML-% IV SOLN
2.0000 g | Freq: Three times a day (TID) | INTRAVENOUS | Status: AC
  Administered 2017-11-03 – 2017-11-04 (×2): 2 g via INTRAVENOUS
  Filled 2017-11-03 (×2): qty 100

## 2017-11-03 MED ORDER — ROCURONIUM BROMIDE 50 MG/5ML IV SOSY
PREFILLED_SYRINGE | INTRAVENOUS | Status: AC
  Filled 2017-11-03: qty 5

## 2017-11-03 MED ORDER — 0.9 % SODIUM CHLORIDE (POUR BTL) OPTIME
TOPICAL | Status: DC | PRN
  Administered 2017-11-03: 1000 mL

## 2017-11-03 MED ORDER — ONDANSETRON HCL 4 MG/2ML IJ SOLN
INTRAMUSCULAR | Status: AC
  Filled 2017-11-03: qty 2

## 2017-11-03 MED ORDER — FENTANYL CITRATE (PF) 250 MCG/5ML IJ SOLN
INTRAMUSCULAR | Status: AC
  Filled 2017-11-03: qty 5

## 2017-11-03 MED ORDER — SODIUM CHLORIDE 0.9 % IV SOLN
INTRAVENOUS | Status: DC | PRN
  Administered 2017-11-03: 50 ug/min via INTRAVENOUS

## 2017-11-03 MED ORDER — PHENYLEPHRINE 40 MCG/ML (10ML) SYRINGE FOR IV PUSH (FOR BLOOD PRESSURE SUPPORT)
PREFILLED_SYRINGE | INTRAVENOUS | Status: DC | PRN
  Administered 2017-11-03: 120 ug via INTRAVENOUS
  Administered 2017-11-03: 200 ug via INTRAVENOUS
  Administered 2017-11-03: 120 ug via INTRAVENOUS

## 2017-11-03 MED ORDER — HEPARIN SODIUM (PORCINE) 5000 UNIT/ML IJ SOLN
5000.0000 [IU] | Freq: Three times a day (TID) | INTRAMUSCULAR | Status: DC
  Administered 2017-11-04 – 2017-11-06 (×7): 5000 [IU] via SUBCUTANEOUS
  Filled 2017-11-03 (×7): qty 1

## 2017-11-03 MED ORDER — HYDROMORPHONE HCL 1 MG/ML IJ SOLN
0.2500 mg | INTRAMUSCULAR | Status: DC | PRN
  Administered 2017-11-03 (×4): 0.5 mg via INTRAVENOUS

## 2017-11-03 MED ORDER — MIDAZOLAM HCL 2 MG/2ML IJ SOLN
INTRAMUSCULAR | Status: AC
  Filled 2017-11-03: qty 2

## 2017-11-03 MED ORDER — HYDROMORPHONE HCL 1 MG/ML IJ SOLN
0.5000 mg | Freq: Once | INTRAMUSCULAR | Status: AC
  Administered 2017-11-03: 0.5 mg via INTRAVENOUS

## 2017-11-03 MED ORDER — PHENYLEPHRINE 40 MCG/ML (10ML) SYRINGE FOR IV PUSH (FOR BLOOD PRESSURE SUPPORT)
PREFILLED_SYRINGE | INTRAVENOUS | Status: AC
  Filled 2017-11-03: qty 10

## 2017-11-03 MED ORDER — KETAMINE HCL 100 MG/ML IJ SOLN
10.0000 mg | Freq: Once | INTRAMUSCULAR | Status: AC
  Administered 2017-11-03: 10 mg via INTRAVENOUS

## 2017-11-03 MED ORDER — FENTANYL CITRATE (PF) 100 MCG/2ML IJ SOLN
INTRAMUSCULAR | Status: AC
  Filled 2017-11-03: qty 2

## 2017-11-03 MED ORDER — ONDANSETRON HCL 4 MG/2ML IJ SOLN
INTRAMUSCULAR | Status: DC | PRN
  Administered 2017-11-03: 4 mg via INTRAVENOUS

## 2017-11-03 MED ORDER — METOPROLOL TARTRATE 5 MG/5ML IV SOLN: 2.0000 mg | INTRAVENOUS | Status: DC | PRN

## 2017-11-03 MED ORDER — ALBUMIN HUMAN 5 % IV SOLN
INTRAVENOUS | Status: DC | PRN
  Administered 2017-11-03: 10:00:00 via INTRAVENOUS

## 2017-11-03 MED ORDER — POTASSIUM CHLORIDE CRYS ER 20 MEQ PO TBCR: 20.0000 meq | EXTENDED_RELEASE_TABLET | Freq: Every day | ORAL | Status: DC | PRN

## 2017-11-03 MED ORDER — HYDRALAZINE HCL 20 MG/ML IJ SOLN: 5.0000 mg | INTRAMUSCULAR | Status: DC | PRN

## 2017-11-03 MED ORDER — GUAIFENESIN-DM 100-10 MG/5ML PO SYRP
15.0000 mL | ORAL_SOLUTION | ORAL | Status: DC | PRN
  Filled 2017-11-03: qty 15

## 2017-11-03 MED ORDER — FENTANYL CITRATE (PF) 100 MCG/2ML IJ SOLN
INTRAMUSCULAR | Status: DC | PRN
  Administered 2017-11-03 (×8): 50 ug via INTRAVENOUS

## 2017-11-03 MED ORDER — OXYCODONE HCL 5 MG PO TABS
10.0000 mg | ORAL_TABLET | Freq: Once | ORAL | Status: AC
  Administered 2017-11-03: 10 mg via ORAL

## 2017-11-03 MED ORDER — PHENOL 1.4 % MT LIQD: 1.0000 | OROMUCOSAL | Status: DC | PRN

## 2017-11-03 MED ORDER — LABETALOL HCL 5 MG/ML IV SOLN
10.0000 mg | INTRAVENOUS | Status: DC | PRN
  Filled 2017-11-03: qty 4

## 2017-11-03 MED ORDER — ALUM & MAG HYDROXIDE-SIMETH 200-200-20 MG/5ML PO SUSP: 15.0000 mL | ORAL | Status: DC | PRN

## 2017-11-03 MED ORDER — KETAMINE HCL-SODIUM CHLORIDE 20-0.9 MG/2ML-% IV SOSY: 10.0000 mg | PREFILLED_SYRINGE | Freq: Two times a day (BID) | INTRAVENOUS | Status: DC | PRN

## 2017-11-03 MED ORDER — SUGAMMADEX SODIUM 200 MG/2ML IV SOLN
INTRAVENOUS | Status: DC | PRN
  Administered 2017-11-03: 130 mg via INTRAVENOUS

## 2017-11-03 MED ORDER — ACETAMINOPHEN 10 MG/ML IV SOLN
1000.0000 mg | Freq: Once | INTRAVENOUS | Status: DC | PRN
  Administered 2017-11-03: 1000 mg via INTRAVENOUS

## 2017-11-03 MED ORDER — DEXMEDETOMIDINE HCL IN NACL 200 MCG/50ML IV SOLN
INTRAVENOUS | Status: DC | PRN
  Administered 2017-11-03: 8 ug via INTRAVENOUS
  Administered 2017-11-03 (×2): 4 ug via INTRAVENOUS
  Administered 2017-11-03: 8 ug via INTRAVENOUS
  Administered 2017-11-03 (×3): 4 ug via INTRAVENOUS

## 2017-11-03 MED ORDER — MAGNESIUM SULFATE 2 GM/50ML IV SOLN
2.0000 g | Freq: Every day | INTRAVENOUS | Status: DC | PRN
  Filled 2017-11-03: qty 50

## 2017-11-03 MED ORDER — LIDOCAINE 2% (20 MG/ML) 5 ML SYRINGE
INTRAMUSCULAR | Status: AC
  Filled 2017-11-03: qty 5

## 2017-11-03 MED ORDER — LACTATED RINGERS IV SOLN
INTRAVENOUS | Status: DC
  Administered 2017-11-03: 09:00:00 via INTRAVENOUS

## 2017-11-03 MED ORDER — SODIUM CHLORIDE 0.9 % IV SOLN
INTRAVENOUS | Status: DC
  Administered 2017-11-03: 17:00:00 via INTRAVENOUS
  Administered 2017-11-04: 75 mL/h via INTRAVENOUS

## 2017-11-03 MED ORDER — EPHEDRINE 5 MG/ML INJ
INTRAVENOUS | Status: AC
  Filled 2017-11-03: qty 10

## 2017-11-03 MED ORDER — LIDOCAINE 2% (20 MG/ML) 5 ML SYRINGE
INTRAMUSCULAR | Status: DC | PRN
  Administered 2017-11-03: 100 mg via INTRAVENOUS

## 2017-11-03 MED ORDER — DOCUSATE SODIUM 100 MG PO CAPS
100.0000 mg | ORAL_CAPSULE | Freq: Every day | ORAL | Status: DC
  Administered 2017-11-04 – 2017-11-05 (×2): 100 mg via ORAL
  Filled 2017-11-03 (×3): qty 1

## 2017-11-03 MED ORDER — PANTOPRAZOLE SODIUM 40 MG PO TBEC
40.0000 mg | DELAYED_RELEASE_TABLET | Freq: Every day | ORAL | Status: DC
  Administered 2017-11-03 – 2017-11-05 (×3): 40 mg via ORAL
  Filled 2017-11-03 (×4): qty 1

## 2017-11-03 MED ORDER — PROPOFOL 10 MG/ML IV BOLUS
INTRAVENOUS | Status: AC
  Filled 2017-11-03: qty 20

## 2017-11-03 MED ORDER — ROCURONIUM BROMIDE 50 MG/5ML IV SOSY
PREFILLED_SYRINGE | INTRAVENOUS | Status: DC | PRN
  Administered 2017-11-03: 50 mg via INTRAVENOUS

## 2017-11-03 SURGICAL SUPPLY — 52 items
BANDAGE ACE 4X5 VEL STRL LF (GAUZE/BANDAGES/DRESSINGS) ×3 IMPLANT
BANDAGE ACE 6X5 VEL STRL LF (GAUZE/BANDAGES/DRESSINGS) ×3 IMPLANT
BANDAGE ESMARK 6X9 LF (GAUZE/BANDAGES/DRESSINGS) IMPLANT
BLADE SAW GIGLI 510 (BLADE) ×2 IMPLANT
BLADE SAW GIGLI 510MM (BLADE) ×1
BNDG CMPR 9X6 STRL LF SNTH (GAUZE/BANDAGES/DRESSINGS)
BNDG ESMARK 6X9 LF (GAUZE/BANDAGES/DRESSINGS)
BNDG GAUZE ELAST 4 BULKY (GAUZE/BANDAGES/DRESSINGS) ×4 IMPLANT
CANISTER SUCT 3000ML PPV (MISCELLANEOUS) ×3 IMPLANT
CLIP LIGATING EXTRA MED SLVR (CLIP) ×3 IMPLANT
CLIP LIGATING EXTRA SM BLUE (MISCELLANEOUS) ×3 IMPLANT
COVER SURGICAL LIGHT HANDLE (MISCELLANEOUS) ×6 IMPLANT
COVER WAND RF STERILE (DRAPES) ×3 IMPLANT
CUFF TOURNIQUET SINGLE 34IN LL (TOURNIQUET CUFF) IMPLANT
CUFF TOURNIQUET SINGLE 44IN (TOURNIQUET CUFF) IMPLANT
DRAIN SNY 10X20 3/4 PERF (WOUND CARE) IMPLANT
DRAPE HALF SHEET 40X57 (DRAPES) ×3 IMPLANT
DRAPE ORTHO SPLIT 77X108 STRL (DRAPES) ×6
DRAPE SURG ORHT 6 SPLT 77X108 (DRAPES) ×2 IMPLANT
ELECT CAUTERY BLADE 6.4 (BLADE) ×3 IMPLANT
ELECT REM PT RETURN 9FT ADLT (ELECTROSURGICAL) ×3
ELECTRODE REM PT RTRN 9FT ADLT (ELECTROSURGICAL) ×1 IMPLANT
EVACUATOR SILICONE 100CC (DRAIN) IMPLANT
GAUZE SPONGE 4X4 12PLY STRL (GAUZE/BANDAGES/DRESSINGS) ×6 IMPLANT
GAUZE SPONGE 4X4 12PLY STRL LF (GAUZE/BANDAGES/DRESSINGS) ×2 IMPLANT
GAUZE XEROFORM 5X9 LF (GAUZE/BANDAGES/DRESSINGS) ×3 IMPLANT
GLOVE BIO SURGEON STRL SZ 6.5 (GLOVE) ×1 IMPLANT
GLOVE BIO SURGEONS STRL SZ 6.5 (GLOVE) ×1
GLOVE BIOGEL PI IND STRL 6.5 (GLOVE) IMPLANT
GLOVE BIOGEL PI IND STRL 7.0 (GLOVE) IMPLANT
GLOVE BIOGEL PI INDICATOR 6.5 (GLOVE) ×8
GLOVE BIOGEL PI INDICATOR 7.0 (GLOVE) ×2
GLOVE SS BIOGEL STRL SZ 7.5 (GLOVE) ×1 IMPLANT
GLOVE SUPERSENSE BIOGEL SZ 7.5 (GLOVE) ×2
GOWN STRL REUS W/ TWL LRG LVL3 (GOWN DISPOSABLE) ×3 IMPLANT
GOWN STRL REUS W/TWL LRG LVL3 (GOWN DISPOSABLE) ×9
KIT BASIN OR (CUSTOM PROCEDURE TRAY) ×3 IMPLANT
KIT TURNOVER KIT B (KITS) ×3 IMPLANT
NS IRRIG 1000ML POUR BTL (IV SOLUTION) ×3 IMPLANT
PACK GENERAL/GYN (CUSTOM PROCEDURE TRAY) ×3 IMPLANT
PAD ARMBOARD 7.5X6 YLW CONV (MISCELLANEOUS) ×6 IMPLANT
PADDING CAST COTTON 6X4 STRL (CAST SUPPLIES) IMPLANT
SPONGE LAP 18X18 X RAY DECT (DISPOSABLE) ×2 IMPLANT
STAPLER VISISTAT 35W (STAPLE) ×3 IMPLANT
STOCKINETTE IMPERVIOUS LG (DRAPES) ×3 IMPLANT
SUT ETHILON 3 0 PS 1 (SUTURE) IMPLANT
SUT VIC AB 0 CT1 18XCR BRD 8 (SUTURE) ×2 IMPLANT
SUT VIC AB 0 CT1 8-18 (SUTURE) ×6
SUT VICRYL AB 2 0 TIES (SUTURE) ×3 IMPLANT
TOWEL GREEN STERILE (TOWEL DISPOSABLE) ×6 IMPLANT
UNDERPAD 30X30 (UNDERPADS AND DIAPERS) ×3 IMPLANT
WATER STERILE IRR 1000ML POUR (IV SOLUTION) ×3 IMPLANT

## 2017-11-03 NOTE — Plan of Care (Signed)
  Problem: Pain Managment: Goal: General experience of comfort will improve Outcome: Progressing   Problem: Safety: Goal: Ability to remain free from injury will improve Outcome: Progressing   

## 2017-11-03 NOTE — Op Note (Signed)
    OPERATIVE REPORT  DATE OF SURGERY: 11/03/2017  PATIENT: [name], 57 y.o. male MRN: [id number]  DOB: 1960/07/11  PRE-OPERATIVE DIAGNOSIS: Severe ischemia right foot  POST-OPERATIVE DIAGNOSIS:  Same  PROCEDURE: Right above-knee amputation and debridement of right thigh abscess  SURGEON:  Curt Jews, M.D.  PHYSICIAN ASSISTANT: Nurse  ANESTHESIA: General  EBL: per anesthesia record  Total I/O In: 1250 [I.V.:1000; IV Piggyback:250] Out: 750 [Urine:200; Blood:550]  BLOOD ADMINISTERED: none  DRAINS: none  SPECIMEN: none  COUNTS CORRECT:  YES  PATIENT DISPOSITION:  PACU - hemodynamically stable  PROCEDURE DETAILS: The patient was taken to the operating placed supine position where the area of the right leg was prepped draped you sterile fashion.  [place] incision was made just above the knee and carried down through the muscle and subcutaneous tissue to the level of the femur.  The superficial femoral artery was chronically occluded and was ligated superficial femoral vein was also ligated and divided.  There was an old segment of nonviable prosthetic Gore-Tex graft from an old femoropopliteal and this was debrided further proximally and divided.  Attic nerve was ligated more proximally and divided as well.  Periosteum was elevated off the femur and the femur was divided with a Gigli saw.  Bone edges were smoothed with a bone rasp.  The wound was irrigated with saline and hemostasis obtained electrocautery.  The fascia from the anterior was closed to the posterior fascia with figure-of-eight 0 Vicryl fascia.  The wound was again irrigated and the skin was closed with skin staples.  Next attention was turned to the subcutaneous abscess on the anterior lateral thigh.  This was where the patient had a prior vein graft to been injecting heroin.  The incision was made over this and was a great deal of dark bloody pus under the skin.  This was debrided as well as the nonviable  vein graft.  This was sent for culture and sensitivity both aerobic and anaerobic.  Once all nonviable tissue was removed, the wound was packed.  A Xeroform gauze was placed over the end of the incision and a Kerlix and Ace wrap were applied.  Patient was transferred to the recovery room in stable condition   [name], M.D., [facility] 11/03/2017 12:45 PM

## 2017-11-03 NOTE — Anesthesia Postprocedure Evaluation (Signed)
Anesthesia Post Note  Patient: [name]  Procedure(s) Performed: Right AMPUTATION ABOVE KNEE  and Incision and Drainage Right thigh abcess (Right Leg Upper)     Patient location during evaluation: PACU Anesthesia Type: General Level of consciousness: awake and alert Pain management: pain level controlled Vital Signs Assessment: post-procedure vital signs reviewed and stable Respiratory status: spontaneous breathing, nonlabored ventilation and respiratory function stable Cardiovascular status: blood pressure returned to baseline and stable Postop Assessment: no apparent nausea or vomiting Anesthetic complications: no    Last Vitals:  Vitals:   11/03/17 1305 11/03/17 1350  BP: 123/72   Pulse: 87   Resp: 16   Temp:  36.7 C  SpO2: 93%     Last Pain:  Vitals:   11/03/17 1315  TempSrc:   PainSc: 10-Worst pain ever                 Brennan Bailey

## 2017-11-03 NOTE — Plan of Care (Signed)
  Problem: Education: Goal: Knowledge of General Education information will improve Description: Including pain rating scale, medication(s)/side effects and non-pharmacologic comfort measures Outcome: Progressing   Problem: Clinical Measurements: Goal: Ability to maintain clinical measurements within normal limits will improve Outcome: Progressing Goal: Will remain free from infection Outcome: Progressing Goal: Diagnostic test results will improve Outcome: Progressing Goal: Respiratory complications will improve Outcome: Progressing Goal: Cardiovascular complication will be avoided Outcome: Progressing   Problem: Coping: Goal: Level of anxiety will decrease Outcome: Progressing   Problem: Elimination: Goal: Will not experience complications related to bowel motility Outcome: Progressing Goal: Will not experience complications related to urinary retention Outcome: Progressing   Problem: Pain Managment: Goal: General experience of comfort will improve Outcome: Progressing   Problem: Safety: Goal: Ability to remain free from injury will improve Outcome: Progressing   

## 2017-11-03 NOTE — Transfer of Care (Signed)
Immediate Anesthesia Transfer of Care Note  Patient: [name]  Procedure(s) Performed: Right AMPUTATION ABOVE KNEE  and Incision and Drainage Right thigh abcess (Right Leg Upper)  Patient Location: PACU  Anesthesia Type:General  Level of Consciousness: awake and patient cooperative  Airway & Oxygen Therapy: Patient Spontanous Breathing  Post-op Assessment: Report given to RN and Post -op Vital signs reviewed and stable  Post vital signs: Reviewed and stable  Last Vitals:  Vitals Value Taken Time  BP    Temp    Pulse    Resp    SpO2      Last Pain:  Vitals:   11/03/17 0801  TempSrc:   PainSc: 2          Complications: No apparent anesthesia complications

## 2017-11-03 NOTE — Interval H&P Note (Signed)
History and Physical Interval Note:  11/03/2017 8:49 AM  [name]  has presented today for surgery, with the diagnosis of PERIPHERAL VASCULAR DISEASE WITH GANGRENE  The various methods of treatment have been discussed with the patient and family. After consideration of risks, benefits and other options for treatment, the patient has consented to  Procedure(s): AMPUTATION ABOVE KNEE (Right) as a surgical intervention .  The patient's history has been reviewed, patient examined, no change in status, stable for surgery.  I have reviewed the patient's chart and labs.  Questions were answered to the patient's satisfaction.     Curt Jews

## 2017-11-03 NOTE — Anesthesia Procedure Notes (Signed)
Procedure Name: Intubation Date/Time: 11/03/2017 9:23 AM Performed by: Lance Coon, CRNA Pre-anesthesia Checklist: Patient identified, Emergency Drugs available, Suction available, Patient being monitored and Timeout performed Patient Re-evaluated:Patient Re-evaluated prior to induction Oxygen Delivery Method: Circle system utilized Preoxygenation: Pre-oxygenation with 100% oxygen Induction Type: IV induction Ventilation: Mask ventilation without difficulty and Oral airway inserted - appropriate to patient size Laryngoscope Size: Miller and 3 Grade View: Grade I Tube type: Oral Tube size: 7.5 mm Number of attempts: 1 Airway Equipment and Method: Stylet Placement Confirmation: ETT inserted through vocal cords under direct vision,  positive ETCO2 and breath sounds checked- equal and bilateral Secured at: 22 cm Tube secured with: Tape Dental Injury: Teeth and Oropharynx as per pre-operative assessment

## 2017-11-03 NOTE — Progress Notes (Signed)
Nurse tech called 8:35 saying patient did not look well. Arrived in room and patients RR was at 4, Nasal canula was out of nose. Placed back on and patients RR jumped to 19, HR 138, oxygen dropped down to 88 on 2 liters , and bumped up to 3 liters and oxygen went to 100, HR 110.  Patient is A&O x4, but lethargic. Rapid response has been called.  8:49 PM

## 2017-11-04 ENCOUNTER — Encounter (HOSPITAL_COMMUNITY): Payer: Self-pay | Admitting: Vascular Surgery

## 2017-11-04 DIAGNOSIS — I739 Peripheral vascular disease, unspecified: Secondary | ICD-10-CM

## 2017-11-04 DIAGNOSIS — Z89611 Acquired absence of right leg above knee: Secondary | ICD-10-CM

## 2017-11-04 LAB — CBC
HCT: 29 % — ABNORMAL LOW (ref 39.0–52.0)
Hemoglobin: 8.8 g/dL — ABNORMAL LOW (ref 13.0–17.0)
MCH: 22.9 pg — ABNORMAL LOW (ref 26.0–34.0)
MCHC: 30.3 g/dL (ref 30.0–36.0)
MCV: 75.5 fL — AB (ref 80.0–100.0)
NRBC: 0 % (ref 0.0–0.2)
PLATELETS: 355 10*3/uL (ref 150–400)
RBC: 3.84 MIL/uL — AB (ref 4.22–5.81)
RDW: 20.1 % — ABNORMAL HIGH (ref 11.5–15.5)
WBC: 8.6 10*3/uL (ref 4.0–10.5)

## 2017-11-04 LAB — BASIC METABOLIC PANEL
ANION GAP: 11 (ref 5–15)
BUN: 14 mg/dL (ref 6–20)
CALCIUM: 8.6 mg/dL — AB (ref 8.9–10.3)
CO2: 22 mmol/L (ref 22–32)
CREATININE: 0.81 mg/dL (ref 0.61–1.24)
Chloride: 97 mmol/L — ABNORMAL LOW (ref 98–111)
Glucose, Bld: 121 mg/dL — ABNORMAL HIGH (ref 70–99)
Potassium: 4.3 mmol/L (ref 3.5–5.1)
Sodium: 130 mmol/L — ABNORMAL LOW (ref 135–145)

## 2017-11-04 LAB — GLUCOSE, CAPILLARY
GLUCOSE-CAPILLARY: 157 mg/dL — AB (ref 70–99)
GLUCOSE-CAPILLARY: 134 mg/dL — AB (ref 70–99)
GLUCOSE-CAPILLARY: 150 mg/dL — AB (ref 70–99)
Glucose-Capillary: 137 mg/dL — ABNORMAL HIGH (ref 70–99)

## 2017-11-04 MED ORDER — INSULIN ASPART 100 UNIT/ML ~~LOC~~ SOLN
0.0000 [IU] | Freq: Three times a day (TID) | SUBCUTANEOUS | Status: DC
  Administered 2017-11-04 (×2): 1 [IU] via SUBCUTANEOUS
  Administered 2017-11-04: 2 [IU] via SUBCUTANEOUS
  Administered 2017-11-05: 1 [IU] via SUBCUTANEOUS
  Administered 2017-11-05: 2 [IU] via SUBCUTANEOUS
  Administered 2017-11-06: 1 [IU] via SUBCUTANEOUS

## 2017-11-04 NOTE — Progress Notes (Signed)
[facility]  Subjective  - POD#1 s/p R AKA.  Pain control issues.  Has PCA.  On chronic narcotics.    Objective (!) 114/59 (!) 105 98.9 F (37.2 C) (Oral) 18 98%  Intake/Output Summary (Last 24 hours) at 11/04/2017 1020 Last data filed at 11/04/2017 0912 Gross per 24 hour  Intake 3093.04 ml  Output 2010 ml  Net 1083.04 ml    General: resting in bed Dressing to R AKA c/d/i  Laboratory Lab Results: Recent Labs    11/02/17 0535 11/03/17 1029 11/04/17 0320  WBC 8.8  --  8.6  HGB 8.9* 9.9* 8.8*  HCT 29.1* 29.0* 29.0*  PLT 376  --  355   BMET Recent Labs    11/02/17 0535 11/03/17 1029 11/04/17 0320  NA 133* 133* 130*  K 4.3 4.5 4.3  CL 99  --  97*  CO2 24  --  22  GLUCOSE 110* 132* 121*  BUN 16  --  14  CREATININE 0.69  --  0.81  CALCIUM 8.8*  --  8.6*    COAG Lab Results  Component Value Date   INR 1.17 10/08/2017   INR 1.13 05/28/2017   INR 1.32 05/25/2017   No results found for: PTT  Assessment/Planning: POD#1 s/p R AKA.  Pain control issues this am - chronic narcotics at home.  Continue PCA today - will start home regimen.  Dressing down tomorrow.  Will have small wound above AKA that will require packing.   Marty Heck 11/04/2017 10:20 AM --

## 2017-11-04 NOTE — Plan of Care (Signed)
  Problem: Education: Goal: Knowledge of General Education information will improve Description: Including pain rating scale, medication(s)/side effects and non-pharmacologic comfort measures Outcome: Progressing   Problem: Health Behavior/Discharge Planning: Goal: Ability to manage health-related needs will improve Outcome: Progressing   Problem: Clinical Measurements: Goal: Will remain free from infection Outcome: Progressing Goal: Respiratory complications will improve Outcome: Progressing   Problem: Activity: Goal: Risk for activity intolerance will decrease Outcome: Progressing   Problem: Coping: Goal: Level of anxiety will decrease Outcome: Progressing   Problem: Pain Managment: Goal: General experience of comfort will improve Outcome: Progressing   Problem: Safety: Goal: Ability to remain free from injury will improve Outcome: Progressing   Problem: Skin Integrity: Goal: Risk for impaired skin integrity will decrease Outcome: Progressing   
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
## 2017-11-04 NOTE — Progress Notes (Signed)
RN paged MD for nicotine for patient. No answer from MD at this time.

## 2017-11-04 NOTE — Evaluation (Signed)
Occupational Therapy Evaluation Patient Details Name: [name] MRN: [id number] DOB: Feb 13, 1960 Today's Date: 11/04/2017    History of Present Illness Patient is a 57 y/o male who presents s/p Rt AKA. PMh includes polysubstance abuse, RA, seizures, CHF, CVA, COPD, depression, HTN, HLD, TB, Hep C, pVD.    Clinical Impression   PTA Pt was living with daughter - using WC and DME for mobility and had an aide to assist with ADL. Pt is currently max A for LB ADL, limited by pain, decreased balance,anxiety. Tolerated bed mobility and sitting EOB-where he did participate in one grooming task sitting with min guard. At this time recommending skilled OT in the acute setting as well as afterwards at the CIR level with goal being mod I at [facility] level. Next session to focus on OOB transfer and sitting balance for ADL.     Follow Up Recommendations  CIR;Supervision/Assistance - 24 hour    Equipment Recommendations  Other (comment)(defer to next venue)    Recommendations for Other Services Rehab consult     Precautions / Restrictions Precautions Precautions: Fall Precaution Comments: anxious Restrictions Weight Bearing Restrictions: No      Mobility Bed Mobility Overal bed mobility: Needs Assistance Bed Mobility: Rolling;Sidelying to Sit;Sit to Supine Rolling: Min assist Sidelying to sit: Mod assist;HOB elevated   Sit to supine: Min guard;HOB elevated   General bed mobility comments: vc for sequencing throughout, use of bed rail and therapist assist for truncal elevation  Transfers                 General transfer comment: NT this session    Balance Overall balance assessment: Needs assistance Sitting-balance support: Bilateral upper extremity supported(foot supported) Sitting balance-Leahy Scale: Fair Sitting balance - Comments: anxious, but seeminly improved from previous session with PT                                   ADL either performed or assessed  with clinical judgement   ADL Overall ADL's : Needs assistance/impaired Eating/Feeding: Modified independent;Bed level   Grooming: Set up;Bed level   Upper Body Bathing: Set up;Bed level   Lower Body Bathing: Moderate assistance;Bed level   Upper Body Dressing : Set up;Bed level   Lower Body Dressing: Maximal assistance;Bed level                 General ADL Comments: Pt limited by pain and anxiety today - declined OOB transfer     Vision Patient Visual Report: No change from baseline       Perception     Praxis      Pertinent Vitals/Pain Pain Assessment: Faces Faces Pain Scale: Hurts worst Pain Location: right residual limb Pain Descriptors / Indicators: Operative site guarding;Guarding;Grimacing;Aching;Discomfort;Sore Pain Intervention(s): Limited activity within patient's tolerance;Monitored during session;Repositioned;PCA encouraged;Other (comment)(educated on gentle massage for residual limb)     Hand Dominance Right   Extremity/Trunk Assessment Upper Extremity Assessment Upper Extremity Assessment: Overall WFL for tasks assessed;Generalized weakness   Lower Extremity Assessment Lower Extremity Assessment: Defer to PT evaluation   Cervical / Trunk Assessment Cervical / Trunk Assessment: Kyphotic   Communication Communication Communication: No difficulties   Cognition Arousal/Alertness: Awake/alert Behavior During Therapy: Anxious Overall Cognitive Status: Within Functional Limits for tasks assessed                           Safety/Judgement:  Decreased awareness of safety         General Comments       Exercises     Shoulder Instructions      Home Living Family/patient expects to be discharged to:: Skilled nursing facility Living Arrangements: Children(living with daughter for a couple of weeks since last d/c) Available Help at Discharge: Family;Available PRN/intermittently(daughter works day shift) Type of Home: House Home  Access: Stairs to enter Technical brewer of Steps: 5 Entrance Stairs-Rails: Right;Left Home Layout: One level     Bathroom Shower/Tub: Teacher, early years/pre: Standard Bathroom Accessibility: Yes How Accessible: Accessible via wheelchair;Accessible via walker(I clarified with daughter that both w/c and RW accessible) Home Equipment: Cane - single point   Additional Comments: lives with daughter has aide every day for a few hours  Lives With: Daughter    Prior Functioning/Environment Level of Independence: Independent with assistive device(s)  Gait / Transfers Assistance Needed: uses cane and w/c for mobility ADL's / Homemaking Assistance Needed: pt states he can dress and bathe on his own but has an aide for bathing, dressing, cooking   Comments: Pt is a poor historian. Unsure of validity of above information.        OT Problem List: Decreased range of motion;Decreased activity tolerance;Impaired balance (sitting and/or standing);Decreased safety awareness;Decreased knowledge of use of DME or AE;Decreased knowledge of precautions;Pain      OT Treatment/Interventions: Self-care/ADL training;DME and/or AE instruction;Therapeutic activities;Patient/family education;Balance training    OT Goals(Current goals can be found in the care plan section) Acute Rehab OT Goals Patient Stated Goal: "get to rehab and get back to independent" OT Goal Formulation: With patient Time For Goal Achievement: 11/18/17 Potential to Achieve Goals: Good ADL Goals Pt Will Perform Lower Body Bathing: with modified independence;with adaptive equipment;sitting/lateral leans Pt Will Perform Lower Body Dressing: with modified independence;sitting/lateral leans Pt Will Transfer to Toilet: with modified independence;stand pivot transfer;bedside commode Pt Will Perform Toileting - Clothing Manipulation and hygiene: with modified independence;sitting/lateral leans Pt/caregiver will Perform Home  Exercise Program: Increased strength;Both right and left upper extremity;With theraband;With written HEP provided Additional ADL Goal #1: Pt will perform bed mobility at mod I level prior to engaging in ADL activity  OT Frequency: Min 2X/week   Barriers to D/C:            Co-evaluation              AM-PAC PT "6 Clicks" Daily Activity     Outcome Measure Help from another person eating meals?: None Help from another person taking care of personal grooming?: A Little Help from another person toileting, which includes using toliet, bedpan, or urinal?: A Lot Help from another person bathing (including washing, rinsing, drying)?: A Lot Help from another person to put on and taking off regular upper body clothing?: A Little Help from another person to put on and taking off regular lower body clothing?: A Lot 6 Click Score: 16   End of Session Nurse Communication: Mobility status  Activity Tolerance: Patient tolerated treatment well Patient left: in bed;with call bell/phone within reach;with bed alarm set  OT Visit Diagnosis: Other abnormalities of gait and mobility (R26.89);Muscle weakness (generalized) (M62.81);Pain Pain - Right/Left: Right Pain - part of body: Leg                Time: 1635-1701 OT Time Calculation (min): 26 min Charges:  OT General Charges $OT Visit: 1 Visit OT Evaluation $OT Eval Moderate Complexity: 1 Mod OT Treatments $  Self Care/Home Management : 8-22 mins  Hulda Humphrey OTR/L Acute Rehabilitation Services Pager: [phone] Office: [place] 11/04/2017, 5:36 PM

## 2017-11-04 NOTE — Plan of Care (Signed)

## 2017-11-04 NOTE — Progress Notes (Addendum)
Inpatient Rehabilitation Admissions Coordinator  I met with patient and his daughter at bedside, [place]. [place]. After his last d/c, he went to stay with daughter. Their plan is for pt to d/c to her home at d/c, and he will need to be Mod I at wheelchair level for she works during the day. He had been wheelchair level since last d/c and able to mobilize within her home well and to the bathroom. I discussed goals and expectations, as well as length of stay expected from an inpt rehab admit. I explained that he would have to be off the PCA as well as able to participate more with transfers prior to being offered an inpt rehab admit. They are in agreement to plan. I will follow up tomorrow for assessment of his progress.  Danne Baxter, RN, MSN Rehab Admissions Coordinator [phone] 11/04/2017 2:26 PM  Daughter's home address will be [place], [place]

## 2017-11-04 NOTE — Evaluation (Signed)
Physical Therapy Evaluation Patient Details Name: [name] MRN: [id number] DOB: 1960/02/24 Today's Date: 11/04/2017   History of Present Illness  Patient is a 57 y/o male who presents s/p Rt AKA. PMh includes polysubstance abuse, RA, seizures, CHF, CVA, COPD, depression, HTN, HLD, TB, Hep C, pVD.   Clinical Impression  Patient presents with pain, decreased AROM right residual limb, anxiety and impaired mobility s/p above. Tolerated bed mobility and sitting EOB-requiring Mod A progressing to min guard assist for safety. Pt highly anxious and fearful of movement. Limited mainly by pain today. Instructed pt in exercises to perform as well as provided education on positioning, phantom pain etc. Pt mainly using w/c and SPC at home for mobility. Lives with family who work. Would benefit from CIR to maximize independence and mobility prior to return home. Will follow acutely.     Follow Up Recommendations CIR;Supervision for mobility/OOB    Equipment Recommendations  None recommended by PT    Recommendations for Other Services       Precautions / Restrictions Precautions Precautions: Fall Restrictions Weight Bearing Restrictions: No      Mobility  Bed Mobility Overal bed mobility: Needs Assistance Bed Mobility: Rolling;Sidelying to Sit;Sit to Supine Rolling: Min assist Sidelying to sit: Mod assist;HOB elevated   Sit to supine: Min guard;HOB elevated   General bed mobility comments: Pt wanting to have therapist pull up on UEs to sit EOB; provided cues to reach for rail, and come to sitting. Able to use UEs to scoot back into bed with increased pain.  Transfers                 General transfer comment: Declined OOB mobility today secondary to pain.  Ambulation/Gait                Stairs            Wheelchair Mobility    Modified Rankin (Stroke Patients Only)       Balance Overall balance assessment: Needs assistance Sitting-balance support:  Bilateral upper extremity supported(foot supported) Sitting balance-Leahy Scale: Fair Sitting balance - Comments: Anxious sitting EOB esp with dynamic tasks.                                     Pertinent Vitals/Pain Pain Assessment: Faces Faces Pain Scale: Hurts worst Pain Location: right residual limb Pain Descriptors / Indicators: Operative site guarding;Guarding;Grimacing;Aching;Discomfort;Sore Pain Intervention(s): Monitored during session;Repositioned;Limited activity within patient's tolerance;PCA encouraged    Home Living Family/patient expects to be discharged to:: Skilled nursing facility Living Arrangements: Children Available Help at Discharge: Family;Available PRN/intermittently Type of Home: House Home Access: Stairs to enter Entrance Stairs-Rails: Psychiatric nurse of Steps: 5 Home Layout: One level Home Equipment: Cane - single point Additional Comments: lives with daughter has aide every day for a few hours    Prior Function Level of Independence: Needs assistance   Gait / Transfers Assistance Needed: uses cane and w/c for mobility  ADL's / Homemaking Assistance Needed: pt states he can dress and bathe on his own but has an aide for bathing, dressing, cooking        Hand Dominance   Dominant Hand: Right    Extremity/Trunk Assessment   Upper Extremity Assessment Upper Extremity Assessment: Defer to OT evaluation    Lower Extremity Assessment Lower Extremity Assessment: LLE deficits/detail RLE Deficits / Details: favors right hip flexion, difficulty getting into neutral  position.  RLE: Unable to fully assess due to pain RLE Sensation: WNL LLE Deficits / Details: Grossly ~4/5 throughout    Cervical / Trunk Assessment Cervical / Trunk Assessment: Kyphotic  Communication   Communication: No difficulties  Cognition Arousal/Alertness: Awake/alert Behavior During Therapy: Anxious Overall Cognitive Status: Within  Functional Limits for tasks assessed                                 General Comments: for basic mobility tasks.      General Comments      Exercises Amputee Exercises Gluteal Sets: Both;10 reps;Supine;Strengthening Hip Extension: AROM;Right;Supine;10 reps Hip ABduction/ADduction: Right;AROM;5 reps;Supine   Assessment/Plan    PT Assessment Patient needs continued PT services  PT Problem List Decreased activity tolerance;Decreased balance;Decreased strength;Decreased mobility;Decreased range of motion;Cardiopulmonary status limiting activity;Decreased skin integrity       PT Treatment Interventions Functional mobility training;Balance training;Therapeutic activities;Therapeutic exercise;Wheelchair mobility training;Neuromuscular re-education;Patient/family education;DME instruction    PT Goals (Current goals can be found in the Care Plan section)  Acute Rehab PT Goals Patient Stated Goal: to go to rehab and get better PT Goal Formulation: With patient Time For Goal Achievement: 11/18/17 Potential to Achieve Goals: Good    Frequency Min 3X/week   Barriers to discharge Decreased caregiver support      Co-evaluation               AM-PAC PT "6 Clicks" Daily Activity  Outcome Measure Difficulty turning over in bed (including adjusting bedclothes, sheets and blankets)?: Unable Difficulty moving from lying on back to sitting on the side of the bed? : Unable Difficulty sitting down on and standing up from a chair with arms (e.g., wheelchair, bedside commode, etc,.)?: Unable Help needed moving to and from a bed to chair (including a wheelchair)?: A Lot Help needed walking in hospital room?: A Lot Help needed climbing 3-5 steps with a railing? : Total 6 Click Score: 8    End of Session Equipment Utilized During Treatment: Oxygen Activity Tolerance: Patient limited by pain Patient left: in bed;with call bell/phone within reach;with bed alarm set;with SCD's  reapplied Nurse Communication: Mobility status PT Visit Diagnosis: Pain;Muscle weakness (generalized) (M62.81);Difficulty in walking, not elsewhere classified (R26.2) Pain - Right/Left: Right Pain - part of body: Leg    Time: 6073-7106 PT Time Calculation (min) (ACUTE ONLY): 16 min   Charges:   PT Evaluation $PT Eval Moderate Complexity: 1 Mod          Wray Kearns, PT, DPT Acute Rehabilitation Services Pager [phone] Office [phone]      [place] 11/04/2017, 12:11 PM
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
## 2017-11-05 LAB — CBC
HCT: 25.1 % — ABNORMAL LOW (ref 39.0–52.0)
Hemoglobin: 7.4 g/dL — ABNORMAL LOW (ref 13.0–17.0)
MCH: 22.2 pg — ABNORMAL LOW (ref 26.0–34.0)
MCHC: 29.5 g/dL — AB (ref 30.0–36.0)
MCV: 75.4 fL — ABNORMAL LOW (ref 80.0–100.0)
Platelets: 342 10*3/uL (ref 150–400)
RBC: 3.33 MIL/uL — AB (ref 4.22–5.81)
RDW: 19.9 % — ABNORMAL HIGH (ref 11.5–15.5)
WBC: 7.1 10*3/uL (ref 4.0–10.5)
nRBC: 0 % (ref 0.0–0.2)

## 2017-11-05 LAB — BASIC METABOLIC PANEL
ANION GAP: 9 (ref 5–15)
BUN: 14 mg/dL (ref 6–20)
CO2: 27 mmol/L (ref 22–32)
Calcium: 8.5 mg/dL — ABNORMAL LOW (ref 8.9–10.3)
Chloride: 96 mmol/L — ABNORMAL LOW (ref 98–111)
Creatinine, Ser: 0.67 mg/dL (ref 0.61–1.24)
Glucose, Bld: 115 mg/dL — ABNORMAL HIGH (ref 70–99)
Potassium: 4.3 mmol/L (ref 3.5–5.1)
SODIUM: 132 mmol/L — AB (ref 135–145)

## 2017-11-05 LAB — GLUCOSE, CAPILLARY
GLUCOSE-CAPILLARY: 120 mg/dL — AB (ref 70–99)
GLUCOSE-CAPILLARY: 141 mg/dL — AB (ref 70–99)
GLUCOSE-CAPILLARY: 218 mg/dL — AB (ref 70–99)
Glucose-Capillary: 189 mg/dL — ABNORMAL HIGH (ref 70–99)

## 2017-11-05 MED ORDER — MAGNESIUM CITRATE PO SOLN
1.0000 | Freq: Every day | ORAL | Status: DC | PRN
  Administered 2017-11-05: 1 via ORAL
  Filled 2017-11-05: qty 296

## 2017-11-05 MED ORDER — NICOTINE 21 MG/24HR TD PT24
21.0000 mg | MEDICATED_PATCH | Freq: Every day | TRANSDERMAL | Status: DC | PRN
  Administered 2017-11-05: 21 mg via TRANSDERMAL

## 2017-11-05 NOTE — NC FL2 (Signed)
[place] MEDICAID FL2 LEVEL OF CARE SCREENING TOOL     IDENTIFICATION  Patient Name: [name] GONNELLA Birthdate: 08/18/1960 Sex: male Admission Date (Current Location): 11/01/2017  [facility] and [place] Number:  Herbalist and Address:  [facility], [place]iz [address] [place], [place] [address]      Provider Number: [id number]  Attending Physician Name and Address:  [name], MD  Relative Name and Phone Number:  [name] (spouse) [phone]    Current Level of Care: Hospital Recommended Level of Care: [place] Prior Approval Number:    Date Approved/Denied:   PASRR Number: [id number]  Discharge Plan: SNF    Current Diagnoses: Patient Active Problem List   Diagnosis Date Noted  . Ischemic pain of foot at rest [facility]) 11/01/2017  . Infection 10/18/2017  . Acute ischemic stroke ([place]) 10/09/2017  . Stroke ([place]) 10/09/2017  . Substance abuse ([place])   . Transient right leg weakness 10/08/2017  . History of completed stroke 10/08/2017  . Microcytic anemia 10/08/2017  . Hyponatremia 10/08/2017  . IV drug abuse ([place]) 10/08/2017  . Right leg pain 08/30/2017  . Bacteremia due to methicillin susceptible Staphylococcus aureus (MSSA)   . 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 Cellulitis of right thigh   . Abscess of right thigh   . History of heroin use   . Cellulitis 05/25/2017  . Abscess of right leg 03/17/2017  . 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 Bilateral hearing loss due to cerumen impaction 08/24/2014  . Non-traumatic rhabdomyolysis   . Left hip pain   . Acute cerebrovascular accident ([place])   . Elevated troponin   . Fall   . Rib pain on right side 07/02/2014  . Blood in stool 07/02/2014  . Polysubstance abuse ([place]) 03/27/2014  . Lung nodule < 6cm on CT 01/18/2014  . History of pulmonary embolism 01/02/2014  . Dysphagia   . Brain abscess, Hx of   . Protein-calorie malnutrition, severe ([place]) 12/01/2013  .  Abnormality of gait 04/16/2012  . Tobacco abuse 11/04/2011  . Carotid stenosis, bilateral 08/21/2011  . Chronic pain in left foot 02/20/2011  . Depression 09/25/2010  . Preventative health care 09/25/2010  . Gastroesophageal reflux disease 07/25/2010  . Anxiety state 08/28/2008  . SLEEP APNEA, OBSTRUCTIVE, MODERATE 04/06/2008  . HERNIATED LUMBAR DISK WITH RADICULOPATHY 04/06/2008  . Type 2 diabetes mellitus ([place]) 02/10/2008  . Dyslipidemia 02/10/2008  . ERECTILE DYSFUNCTION 02/10/2008  . HYPERTENSION, BENIGN ESSENTIAL 02/10/2008  . Peripheral vascular disease ([place]) 02/10/2008  . COPD (chronic obstructive pulmonary disease) ([place]) 02/10/2008    Orientation RESPIRATION BLADDER Height & Weight     Self, Time, Situation, Place  Normal(occasional 2L/m nasal cannula at admittance, not currently on) Continent Weight: 139 lb 15.9 oz (63.5 kg) Height:  6\' 1"  (185.4 cm)  BEHAVIORAL SYMPTOMS/MOOD NEUROLOGICAL BOWEL NUTRITION STATUS      Continent Diet(see discharge summary)  AMBULATORY STATUS COMMUNICATION OF NEEDS Skin   Limited Assist Verbally Surgical wounds(right leg closed surgical incision w/amputation, ecchymosis right and left abdomen)                       Personal Care Assistance Level of Assistance  Bathing, Feeding, Dressing, Total care Bathing Assistance: Limited assistance Feeding assistance: Independent Dressing Assistance: Limited assistance Total Care Assistance: Limited assistance   Functional Limitations Info  Sight, Hearing, Speech Sight Info: Adequate Hearing Info: Adequate Speech Info: Adequate    SPECIAL  CARE FACTORS FREQUENCY  PT (By licensed PT), OT (By licensed OT)     PT Frequency: min 3x weekly OT Frequency: min 2x weekly            Contractures Contractures Info: Not present    Additional Factors Info  Allergies, Code Status Code Status Info: full Allergies Info: Allergies:  Buprenorphine Hcl-naloxone Hcl, Ciprofloxacin,  Morphine-naltrexone, Shellfish-derived Products, Benadryl Diphenhydramine Hcl, Fish Allergy, Morphine And Related, Vicodin Hydrocodone-acetaminophen           Current Medications (11/05/2017):  This is the current hospital active medication list Current Facility-Administered Medications  Medication Dose Route Frequency Provider Last Rate Last Dose  . 0.9 %  sodium chloride infusion   Intravenous Continuous Gabriel Earing, PA-C 75 mL/hr at 11/04/17 1324 75 mL/hr at 11/04/17 1324  . alum & mag hydroxide-simeth (MAALOX/MYLANTA) 200-200-20 MG/5ML suspension 15-30 mL  15-30 mL Oral Q2H PRN Rhyne, Samantha J, PA-C      . diphenhydrAMINE (BENADRYL) injection 12.5 mg  12.5 mg Intravenous Q6H PRN Rhyne, Samantha J, PA-C       Or  . diphenhydrAMINE (BENADRYL) 12.5 MG/5ML elixir 12.5 mg  12.5 mg Oral Q6H PRN Rhyne, Samantha J, PA-C      . docusate sodium (COLACE) capsule 100 mg  100 mg Oral Daily Rhyne, Samantha J, PA-C   100 mg at 11/05/17 0907  . feeding supplement (ENSURE ENLIVE) (ENSURE ENLIVE) liquid 237 mL  237 mL Oral TID BM Rhyne, Samantha J, PA-C   237 mL at 11/05/17 0909  . guaiFENesin-dextromethorphan (ROBITUSSIN DM) 100-10 MG/5ML syrup 15 mL  15 mL Oral Q4H PRN Rhyne, Samantha J, PA-C      . heparin injection 5,000 Units  5,000 Units Subcutaneous Q8H Rhyne, Samantha J, PA-C   5,000 Units at 11/05/17 1429  . hydrALAZINE (APRESOLINE) injection 5 mg  5 mg Intravenous Q20 Min PRN Rhyne, Hulen Shouts, PA-C      . HYDROmorphone (DILAUDID) 1 mg/mL PCA injection   Intravenous Q4H Rhyne, Samantha J, PA-C   25 mg at 11/05/17 1325  . Influenza vac split quadrivalent PF (FLUARIX) injection 0.5 mL  0.5 mL Intramuscular Prior to discharge Rhyne, Samantha J, PA-C      . insulin aspart (novoLOG) injection 0-9 Units  0-9 Units Subcutaneous TID WC Early, Arvilla Meres, MD   1 Units at 11/05/17 1229  . labetalol (NORMODYNE,TRANDATE) injection 10 mg  10 mg Intravenous Q10 min PRN Rhyne, Samantha J, PA-C      .  magnesium citrate solution 1 Bottle  1 Bottle Oral Daily PRN Marty Heck, MD   1 Bottle at 11/05/17 1429  . magnesium sulfate IVPB 2 g 50 mL  2 g Intravenous Daily PRN Rhyne, Samantha J, PA-C      . metoprolol tartrate (LOPRESSOR) injection 2-5 mg  2-5 mg Intravenous Q2H PRN Rhyne, Samantha J, PA-C      . multivitamin with minerals tablet 1 tablet  1 tablet Oral Daily Rhyne, Samantha J, PA-C   1 tablet at 11/05/17 0907  . naloxone [facility]) injection 0.4 mg  0.4 mg Intravenous PRN Rhyne, Samantha J, PA-C       And  . sodium chloride flush (NS) 0.9 % injection 9 mL  9 mL Intravenous PRN Rhyne, Samantha J, PA-C      . nicotine (NICODERM CQ - dosed in mg/24 hours) patch 21 mg  21 mg Transdermal Daily PRN [name], MD   21 mg at 11/05/17 0942  .  ondansetron (ZOFRAN) injection 4 mg  4 mg Intravenous Q6H PRN Rhyne, Samantha J, PA-C   4 mg at 11/05/17 0407  . pantoprazole (PROTONIX) EC tablet 40 mg  40 mg Oral Daily Gabriel Earing, PA-C   40 mg at 11/05/17 8786  . phenol (CHLORASEPTIC) mouth spray 1 spray  1 spray Mouth/Throat PRN Rhyne, Samantha J, PA-C      . potassium chloride SA (K-DUR,KLOR-CON) CR tablet 20-40 mEq  20-40 mEq Oral Daily PRN Rhyne, Hulen Shouts, PA-C         Discharge Medications: Please see discharge summary for a list of discharge medications.  Relevant Imaging Results:  Relevant Lab Results:   Additional Information [id number]  [facility], LCSW

## 2017-11-05 NOTE — Progress Notes (Addendum)
Inpatient Rehabilitation Admissions Coordinator  I met with patient at bedside to discus his preferences for rehab venue. He is requesting to d/c home today. I explained that our inpt rehab facility is smoke free. Pt reports frustration with the  care that he is receiving and wants to d/c home.States he can get what he needs from home with [facility]. I have alerted RN CM , Manuela Schwartz, and we will sign off.  Danne Baxter, RN, MSN Rehab Admissions Coordinator [phone] 11/05/2017 11:31 AM

## 2017-11-05 NOTE — Progress Notes (Addendum)
  Progress Note    11/05/2017 9:17 AM 2 Days Post-Op  Subjective:  Complaining of PCA not working, not being able to smoke, and machines beeping this morning   Vitals:   11/05/17 0413 11/05/17 0800  BP:    Pulse:    Resp: 18 17  Temp:    SpO2: 94% 95%    Physical Exam: Incisions:  R AKA stump site incision with viable skin edges approximated well with staples, no drainage; R thigh open wound granulating well without sign of infection   CBC    Component Value Date/Time   WBC 7.1 11/05/2017 0348   RBC 3.33 (L) 11/05/2017 0348   HGB 7.4 (L) 11/05/2017 0348   HGB CANCELED 10/15/2017 0000   HCT 25.1 (L) 11/05/2017 0348   HCT CANCELED 10/15/2017 0000   PLT 342 11/05/2017 0348   PLT CANCELED 10/15/2017 0000   MCV 75.4 (L) 11/05/2017 0348   MCV 78 (L) 08/30/2017 1417   MCH 22.2 (L) 11/05/2017 0348   MCHC 29.5 (L) 11/05/2017 0348   RDW 19.9 (H) 11/05/2017 0348   RDW 17.9 (H) 08/30/2017 1417   LYMPHSABS 3.2 10/18/2017 0306   LYMPHSABS CANCELED 10/15/2017 0000   MONOABS 0.9 10/18/2017 0306   EOSABS 0.2 10/18/2017 0306   EOSABS CANCELED 10/15/2017 0000   BASOSABS 0.1 10/18/2017 0306   BASOSABS CANCELED 10/15/2017 0000    BMET    Component Value Date/Time   NA 132 (L) 11/05/2017 0348   NA 132 (L) 10/15/2017 0000   K 4.3 11/05/2017 0348   CL 96 (L) 11/05/2017 0348   CO2 27 11/05/2017 0348   GLUCOSE 115 (H) 11/05/2017 0348   BUN 14 11/05/2017 0348   BUN 17 10/15/2017 0000   CREATININE 0.67 11/05/2017 0348   CREATININE 0.85 05/10/2014 1114   CALCIUM 8.5 (L) 11/05/2017 0348   CALCIUM 9.1 10/28/2009 2118   GFRNONAA >60 11/05/2017 0348   GFRNONAA >89 02/01/2014 1518   GFRAA >60 11/05/2017 0348   GFRAA >89 02/01/2014 1518    INR    Component Value Date/Time   INR 1.17 10/08/2017 1850     Intake/Output Summary (Last 24 hours) at 11/05/2017 0917 Last data filed at 11/05/2017 0351 Gross per 24 hour  Intake 1674 ml  Output 1200 ml  Net 474 ml      Assessment/Plan:  57 y.o. male is s/p right above knee amputation  2 Days Post-Op  -R AKA incision unremarkable -Daily dry dressing change R AKA incision and wet to dry packing of R thigh open wound -Patient would like to be discharged back to live with daughter if unable to find a rehab facility that "allows him to smoke" - CSW consulted for possible SNF placement   Dagoberto Ligas, PA-C [facility] [phone] 11/05/2017 9:17 AM   I have seen and evaluated the patient. I agree with the PA note as documented above. R AKA looks good after dressing down today.  Wet to dry packing of right thigh wound.  Marty Heck, MD [facility] [place] Office: [phone] Pager: [phone]

## 2017-11-05 NOTE — Clinical Social Work Note (Signed)
Clinical Social Work Assessment  Patient Details  Name: [name] MRN: [id number] Date of Birth: 1960-07-21  Date of referral:  11/05/17               Reason for consult:  Discharge Planning                Permission sought to share information with:  Case Manager, Facility Sport and exercise psychologist Permission granted to share information::  Yes, Verbal Permission Granted  Name::     no family identified to make contact  Agency::  SNFs  Relationship::     Contact Information:     Housing/Transportation Living arrangements for the past 2 months:  Single Family Home Source of Information:  Patient Patient Interpreter Needed:  None Criminal Activity/Legal Involvement Pertinent to Current Situation/Hospitalization:  No - Comment as needed Significant Relationships:  None Lives with:  Self Do you feel safe going back to the place where you live?  No Need for family participation in patient care:  No (Coment)  Care giving concerns:  CSW received referral for possible SNF placement at time of discharge. Spoke with patient regarding possibility of SNF placement . Patient's  is currently unable to care for himself at home given patient's current needs and fall risk.  Patient expressed understanding of PT recommendation and are agreeable to SNF placement at time of discharge. CSW to continue to follow and assist with discharge planning needs.    Social Worker assessment / plan:  Spoke with patient concerning possibility of rehab at SNF before returning home.    Employment status:  Retired Forensic scientist:  Medicaid In [place] PT Recommendations:  [place] / Referral to community resources:  [place]  Patient/Family's Response to care:  Patient  recognizes need for rehab before returning home and are agreeable to a SNF in [place]. They report preference for   [facility] or [place] as patient reports he has seen friends at these  facilities that are able to smoke. Patient reports he needs a facility he will be able to smoke at . CSW explained insurance authorization process. Patient's  reported that they want patient to get stronger to be able to come back home.    Patient/Family's Understanding of and Emotional Response to Diagnosis, Current Treatment, and Prognosis:  Patient  is realistic regarding therapy needs and expressed being hopeful for SNF placement. Patient expressed understanding of CSW role and discharge process as well as medical condition. No questions/concerns about plan or treatment.   Emotional Assessment Appearance:  Appears stated age Attitude/Demeanor/Rapport:  Engaged Affect (typically observed):  Adaptable Orientation:  Oriented to Self, Oriented to  Time, Oriented to Place Alcohol / Substance use:  Not Applicable Psych involvement (Current and /or in the community):  No (Comment)  Discharge Needs  Concerns to be addressed:  Discharge Planning Concerns Readmission within the last 30 days:  No Current discharge risk:  Dependent with Mobility Barriers to Discharge:  Continued Medical Work up   [employer], LCSW 11/05/2017, 2:39 PM

## 2017-11-05 NOTE — Plan of Care (Signed)
  Problem: Education: Goal: Knowledge of General Education information will improve Description Including pain rating scale, medication(s)/side effects and non-pharmacologic comfort measures Outcome: Progressing Note:  POC reviewed with pt.; using PCA; anxious and wanting to smoke- will need nicotine patch.

## 2017-11-05 NOTE — Progress Notes (Signed)
Physical Therapy Treatment Patient Details Name: [name] MRN: [id number] DOB: 09-27-1960 Today's Date: 11/05/2017    History of Present Illness Patient is a 57 y/o male who presents s/p Rt AKA. PMh includes polysubstance abuse, RA, seizures, CHF, CVA, COPD, depression, HTN, HLD, TB, Hep C, pVD.     PT Comments    Patient progressing well towards PT goals. Tolerated bed mobility, standing and transfer to chair with Min-mod A for balance/safety. Able to perform hop to pattern with assist for balance to get to chair. Noted left knee instability. Pt reporting phantom limb pain and burning through right residual limb. Tolerated standing hip extension AROM as pt's right residual limb favors hip flexion as position of comfort- almost able to get to neutral positioning at hip. Will continue to follow and progress as tolerated.    Follow Up Recommendations  CIR;Supervision for mobility/OOB     Equipment Recommendations  None recommended by PT    Recommendations for Other Services       Precautions / Restrictions Precautions Precautions: Fall Restrictions Weight Bearing Restrictions: No RLE Weight Bearing: Non weight bearing    Mobility  Bed Mobility Overal bed mobility: Needs Assistance Bed Mobility: Rolling;Sidelying to Sit Rolling: Min guard Sidelying to sit: Mod assist;HOB elevated       General bed mobility comments: Cues for sequencing/technique, use of rail and therapist to elevate trunk.  Transfers Overall transfer level: Needs assistance Equipment used: Rolling walker (2 wheeled) Transfers: Sit to/from [facility] Sit to Stand: Min assist Stand pivot transfers: Min guard       General transfer comment: Assist to power to standing with cues for hand placement/technique. Increased time to actually stand due to nervousness/pain. Able to hop a few times to get to chair with Min A for balance.   Ambulation/Gait                 Stairs              Wheelchair Mobility    Modified Rankin (Stroke Patients Only)       Balance Overall balance assessment: Needs assistance Sitting-balance support: Feet supported;Bilateral upper extremity supported Sitting balance-Leahy Scale: Fair Sitting balance - Comments: Uses BUE support for comfort.    Standing balance support: During functional activity;Bilateral upper extremity supported Standing balance-Leahy Scale: Poor Standing balance comment: Reliant on BUes for support in standing and Min A from therapist for dynamic tasks.                             Cognition Arousal/Alertness: Awake/alert Behavior During Therapy: Anxious Overall Cognitive Status: Within Functional Limits for tasks assessed                           Safety/Judgement: Decreased awareness of safety     General Comments: Pt stating he will get up on his own.      Exercises Amputee Exercises Hip Extension: AROM;Right;10 reps;Standing Chair Push Up: Both;5 reps;Seated;Strengthening    General Comments        Pertinent Vitals/Pain Pain Assessment: Faces Faces Pain Scale: Hurts even more Pain Location: right residual limb Pain Descriptors / Indicators: Operative site guarding;Burning Pain Intervention(s): Monitored during session;Repositioned;PCA encouraged    Home Living                      Prior Function  PT Goals (current goals can now be found in the care plan section) Progress towards PT goals: Progressing toward goals    Frequency    Min 3X/week      PT Plan Current plan remains appropriate    Co-evaluation              AM-PAC PT "6 Clicks" Daily Activity  Outcome Measure  Difficulty turning over in bed (including adjusting bedclothes, sheets and blankets)?: A Little Difficulty moving from lying on back to sitting on the side of the bed? : Unable Difficulty sitting down on and standing up from a chair with arms  (e.g., wheelchair, bedside commode, etc,.)?: Unable Help needed moving to and from a bed to chair (including a wheelchair)?: A Little Help needed walking in hospital room?: A Lot Help needed climbing 3-5 steps with a railing? : Total 6 Click Score: 11    End of Session Equipment Utilized During Treatment: Gait belt Activity Tolerance: Patient tolerated treatment well Patient left: in chair;with call bell/phone within reach;Other (comment)(with OT present in room) Nurse Communication: Mobility status PT Visit Diagnosis: Pain;Muscle weakness (generalized) (M62.81);Difficulty in walking, not elsewhere classified (R26.2) Pain - Right/Left: Right Pain - part of body: Leg     Time: 1003-1030 PT Time Calculation (min) (ACUTE ONLY): 27 min  Charges:  $Therapeutic Activity: 23-37 mins                     Wray Kearns, [place], DPT Acute Rehabilitation Services Pager [phone] Office [place] 11/05/2017, 10:36 AM

## 2017-11-05 NOTE — Progress Notes (Addendum)
Occupational Therapy Treatment Patient Details Name: [name] MRN: [id number] DOB: [birth year] Today's Date: 11/05/2017    History of present illness Patient is a 57 y/o male who presents s/p Rt AKA. PMh includes polysubstance abuse, RA, seizures, CHF, CVA, COPD, depression, HTN, HLD, TB, Hep C, pVD.    OT comments  Pt progressing towards OT goals this session. Pt agreeable for transfer OOB. Education provided on gentle massage and desensitization of residual limb for phantom pains. Pt's balance in standing is dependent on BUE and so Pt is able to complete grooming tasks in sitting position. At the end of session, NT present, Pt's chair alarm placed - but not on as Pt preparing for bath with NT. OT will continue to follow acutely.    Follow Up Recommendations  CIR;Supervision/Assistance - 24 hour    Equipment Recommendations  3 in 1 bedside commode;Tub/shower bench;Wheelchair (measurements OT);Wheelchair cushion (measurements OT)    Recommendations for Other Services Rehab consult    Precautions / Restrictions Precautions Precautions: Fall Restrictions Weight Bearing Restrictions: Yes RLE Weight Bearing: Non weight bearing       Mobility Bed Mobility Overal bed mobility: Needs Assistance Bed Mobility: Rolling;Sidelying to Sit Rolling: Min guard Sidelying to sit: Mod assist;HOB elevated       General bed mobility comments: in recliner at the beginning and end of session  Transfers Overall transfer level: Needs assistance Equipment used: Rolling walker (2 wheeled) Transfers: Sit to/from Stand Sit to Stand: Min assist Stand pivot transfers: Min guard       General transfer comment: vc for safe hand placement, verbal encouragement for anxiety; min A for boost    Balance Overall balance assessment: Needs assistance Sitting-balance support: Feet supported;Single extremity supported Sitting balance-Leahy Scale: Fair Sitting balance - Comments: Uses BUE support  for comfort.    Standing balance support: During functional activity;Bilateral upper extremity supported Standing balance-Leahy Scale: Poor Standing balance comment: dependent on BUE for support                           ADL either performed or assessed with clinical judgement   ADL Overall ADL's : Needs assistance/impaired     Grooming: Set up;Sitting;Wash/dry face;Oral care                   Toilet Transfer: Minimal assistance;Stand-pivot;BSC;RW Toilet Transfer Details (indicate cue type and reason): vc for safe hand placement, min A for boost Toileting- Clothing Manipulation and Hygiene: Min guard;Sitting/lateral lean       Functional mobility during ADLs: Minimal assistance General ADL Comments: educated on gentle rubbing of residual limb for densitization techniqe for phantom pains     Vision       Perception     Praxis      Cognition Arousal/Alertness: Awake/alert Behavior During Therapy: Anxious Overall Cognitive Status: Within Functional Limits for tasks assessed                           Safety/Judgement: Decreased awareness of deficits             Exercises Amputee Exercises Hip Extension: AROM;Right;10 reps;Standing Chair Push Up: Both;5 reps;Seated;Strengthening   Shoulder Instructions       General Comments NT in room at the end of session, chair alarm placed but not activated due to pending bath    Pertinent Vitals/ Pain       Pain Assessment: Faces Faces  Pain Scale: Hurts whole lot Pain Location: right residual limb Pain Descriptors / Indicators: Operative site guarding;Burning Pain Intervention(s): Monitored during session;Repositioned;Other (comment)(gentle desensitization of residual limb)  Home Living                                          Prior Functioning/Environment              Frequency  Min 2X/week        Progress Toward Goals  OT Goals(current goals can now be  found in the care plan section)  Progress towards OT goals: Progressing toward goals  Acute Rehab OT Goals Patient Stated Goal: "get to rehab and get back to independent" OT Goal Formulation: With patient Time For Goal Achievement: 11/18/17 Potential to Achieve Goals: Good  Plan Discharge plan remains appropriate;Frequency remains appropriate    Co-evaluation                 AM-PAC PT "6 Clicks" Daily Activity     Outcome Measure   Help from another person eating meals?: None Help from another person taking care of personal grooming?: A Little Help from another person toileting, which includes using toliet, bedpan, or urinal?: A Lot Help from another person bathing (including washing, rinsing, drying)?: A Lot Help from another person to put on and taking off regular upper body clothing?: A Little Help from another person to put on and taking off regular lower body clothing?: A Lot 6 Click Score: 16    End of Session Equipment Utilized During Treatment: Gait belt;Rolling walker  OT Visit Diagnosis: Other abnormalities of gait and mobility (R26.89);Muscle weakness (generalized) (M62.81);Pain Pain - Right/Left: Right Pain - part of body: Leg   Activity Tolerance Patient tolerated treatment well   Patient Left in chair;with call bell/phone within reach;with nursing/sitter in room   Nurse Communication Mobility status        Time: 1696-7893 OT Time Calculation (min): 18 min  Charges: OT General Charges $OT Visit: 1 Visit OT Treatments $Self Care/Home Management : 8-22 mins  Hulda Humphrey OTR/L Acute Rehabilitation Services Pager: [phone] Office: [place] 11/05/2017, 1:43 PM   Addendum: updated equipment recommendations

## 2017-11-06 LAB — GLUCOSE, CAPILLARY: GLUCOSE-CAPILLARY: 131 mg/dL — AB (ref 70–99)

## 2017-11-06 MED ORDER — ASPIRIN EC 81 MG PO TBEC: 81.0000 mg | DELAYED_RELEASE_TABLET | Freq: Every day | ORAL | 0 refills | Status: AC

## 2017-11-06 MED ORDER — OXYCODONE-ACETAMINOPHEN 10-325 MG PO TABS: 1.0000 | ORAL_TABLET | ORAL | 0 refills | Status: DC | PRN

## 2017-11-06 NOTE — Discharge Instructions (Signed)
Keep incision clean and dry Wet to dry packing for open wound of right thigh

## 2017-11-06 NOTE — Discharge Summary (Signed)
Physician Discharge Summary   Patient ID: [name] [id number] 57 y.o. 1960-02-06  Admit date: 11/01/2017  Discharge date and time: 11/06/2017 10:53 AM   Admitting Physician: [name], MD   Discharge Physician: Dr. 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  He is now agreeable to amputation.  Dr. Donnetta Hutching brought the patient to the operating room on 11/03/2017 and he underwent right above-the-knee amputation.  Postoperatively patient was recommended for inpatient rehabilitation by physical therapy and Occupational Therapy.  Patient was agreeable to a skilled nursing facility instead of returning home to live with his daughter.  He was weaned from PCA pain medication.  Patient became increasingly frustrated with nursing staff and felt he was kept from being able to smoke a cigarette outside.  He threatened to leave AMA this morning.  He no longer had any desire to stay in a skilled nursing facility or rehabilitation and instead would like to be discharged home to live with his daughter.  Through discussion he was agreeable to allow me time to renew his home health nurse to continue wet-to-dry packing of right thigh open wound and to arrange home health physical therapy with the help of the case manager.  Once home health was arranged patient was discharged.  He was prescribed number #30 10/325 mg Percocet and is aware he will need to reestablish with a pain clinic for  additional pain medication.  He will follow-up in office in about 4 to 6 weeks for staple removal and for evaluation of right AKA prior to referral for prosthetic leg.  Discharge instructions were reviewed with the patient.  He was discharged to home with home health RN and PT in stable condition.  Consults: None  Treatments: surgery: Right AKA by Dr. Donnetta Hutching on 11/03/2017  Discharge Exam: PCA was discontinued; patient threatening to leave AMA; expresses his desire to return back to daughter's house instead of discharge to rehab facility; he agreed to allow me to arrange home health PT and RN prior to discharge  Physical exam: Open wound right thigh remains clean with a healthy wound bed without sign of infection; mild edema right AKA; skin edges of AKA incision approximated well with staples; no drainage  Vitals:   11/05/17 2042 11/06/17 0516  BP: 109/61 110/80  Pulse: 100 96  Resp: 18   Temp: 98.1 F (36.7 C) 98.4 F (36.9 C)  SpO2: 100% 97%     Disposition: Discharge disposition: 01-Home or Self Care       Patient Instructions:  Allergies as of 11/06/2017      Reactions   Buprenorphine Hcl-naloxone Hcl Shortness Of Breath, Other (See Comments)   "Felt like I was going to die," was  jittery, had trouble breathing, felt hot (reaction to Suboxone) PER THE [place] database, the patient received #42 Suboxone films between 03/26/17 and 04/02/17 from [facility]...(??)   Ciprofloxacin Anaphylaxis   Morphine-naltrexone Other (See Comments)   Seizures   Shellfish-derived Products Anaphylaxis, Hives, Swelling   Benadryl [diphenhydramine Hcl] Hives, Itching, Rash  Fish Allergy Hives, Swelling, Rash   Morphine And Related Other (See Comments)   Seizures   Vicodin [hydrocodone-acetaminophen] Nausea Only      Medication List    TAKE these medications   ACCU-CHEK AVIVA device Use as instructed to check blood sugar once per day   accu-chek soft touch lancets Use as  instructed once per day when checking blood sugar   albuterol (2.5 MG/3ML) 0.083% nebulizer solution Commonly known as:  PROVENTIL Take 3 mLs (2.5 mg total) by nebulization every 6 (six) hours as needed for wheezing or shortness of breath.   albuterol 108 (90 Base) MCG/ACT inhaler Commonly known as:  PROVENTIL HFA;VENTOLIN HFA Inhale 2 puffs into the lungs every 4 (four) hours as needed for wheezing or shortness of breath.   aspirin EC 81 MG tablet Take 1 tablet (81 mg total) by mouth daily for 21 days.   atorvastatin 80 MG tablet Commonly known as:  LIPITOR Take 1 tablet (80 mg total) by mouth daily at 6 PM.   clopidogrel 75 MG tablet Commonly known as:  PLAVIX Take 1 tablet (75 mg total) by mouth daily.   fluticasone 50 MCG/ACT nasal spray Commonly known as:  FLONASE Place 2 sprays into both nostrils daily.   glucose blood test strip Use as instructed once daily to check blood sugar   hydrOXYzine 25 MG capsule Commonly known as:  VISTARIL Take 1 capsule (25 mg total) by mouth 2 (two) times daily. Itching   nicotine 21 mg/24hr patch Commonly known as:  NICODERM CQ - dosed in mg/24 hours Place 1 patch (21 mg total) onto the skin daily.   ondansetron 4 MG tablet Commonly known as:  ZOFRAN Take 1 tablet (4 mg total) by mouth every 8 (eight) hours as needed for nausea or vomiting.   ondansetron 8 MG tablet Commonly known as:  ZOFRAN Take 8 mg by mouth 3 (three) times daily as needed for nausea/vomiting.   oxyCODONE-acetaminophen 10-325 MG tablet Commonly known as:  PERCOCET Take 1 tablet by mouth every 4 (four) hours as needed for up to 30 doses for pain. What changed:  when to take this   pantoprazole 20 MG tablet Commonly known as:  PROTONIX Take 1 tablet (20 mg total) by mouth daily before breakfast.   pregabalin 75 MG capsule Commonly known as:  LYRICA TAKE 1 CAPSULE BY MOUTH THREE TIMES A DAY      Activity: activity as tolerated ; avoid pressure to right  AKA for at least another for 5 weeks Diet: regular diet Wound Care: Wet-to-dry packing of right thigh wound daily; keep right AKA incision clean and dry  Follow-up with Dr. Donnetta Hutching in 4 weeks.  SignedDagoberto Ligas 11/06/2017 11:35 AM

## 2017-11-06 NOTE — Care Management Note (Signed)
Case Management Note  Patient Details  Name: [name] MRN: [id number] Date of Birth: 06/23/1960  Subjective/Objective:                    Action/Plan:  Received a call from PA , patient wanting to discharge to home with home health instead of SNF. Patient was active with [facility] prior to admission. PA with enter orders for RN ( packing ) and PT. [name] with [facility] aware discharge is today. Spoke with bedside nurse.  Expected Discharge Date:                  Expected Discharge Plan:  [place]  In-House Referral:     Discharge planning Services  CM Consult  Post Acute Care Choice:  Home Health Choice offered to:     DME Arranged:    DME Agency:  NA  HH Arranged:  PT, RN [place] Agency:  [place]  Status of Service:  Completed, signed off  If discussed at [place] of Stay Meetings, dates discussed:    Additional Comments:  Marilu Favre, RN 11/06/2017, 10:00 AM

## 2017-11-06 NOTE — Plan of Care (Signed)

## 2017-11-06 NOTE — Progress Notes (Signed)
Discharge instructions completed with pt.  Pt verbalized understanding of the information.  Pt denies chest pain, shortness of breath, dizziness, lightheadedness, and n/v. Pt took out his own IV earlier.  Pt discharged home.

## 2017-11-08 ENCOUNTER — Ambulatory Visit: Payer: Self-pay | Admitting: Family Medicine

## 2017-11-08 ENCOUNTER — Encounter (HOSPITAL_COMMUNITY): Payer: Self-pay

## 2017-11-08 ENCOUNTER — Emergency Department (HOSPITAL_COMMUNITY): Payer: Medicaid Other

## 2017-11-08 ENCOUNTER — Emergency Department (HOSPITAL_COMMUNITY)
Admission: EM | Admit: 2017-11-08 | Discharge: 2017-11-08 | Disposition: A | Discharge status: Home / Self Care | Payer: Medicaid Other | Attending: Emergency Medicine | Admitting: Emergency Medicine

## 2017-11-08 DIAGNOSIS — J449 Chronic obstructive pulmonary disease, unspecified: Secondary | ICD-10-CM | POA: Diagnosis not present

## 2017-11-08 DIAGNOSIS — I110 Hypertensive heart disease with heart failure: Secondary | ICD-10-CM | POA: Insufficient documentation

## 2017-11-08 DIAGNOSIS — F17210 Nicotine dependence, cigarettes, uncomplicated: Secondary | ICD-10-CM | POA: Insufficient documentation

## 2017-11-08 DIAGNOSIS — I509 Heart failure, unspecified: Secondary | ICD-10-CM | POA: Diagnosis not present

## 2017-11-08 DIAGNOSIS — G8918 Other acute postprocedural pain: Secondary | ICD-10-CM | POA: Diagnosis not present

## 2017-11-08 DIAGNOSIS — Z79899 Other long term (current) drug therapy: Secondary | ICD-10-CM | POA: Insufficient documentation

## 2017-11-08 DIAGNOSIS — Z7982 Long term (current) use of aspirin: Secondary | ICD-10-CM | POA: Insufficient documentation

## 2017-11-08 DIAGNOSIS — E119 Type 2 diabetes mellitus without complications: Secondary | ICD-10-CM | POA: Insufficient documentation

## 2017-11-08 DIAGNOSIS — M79604 Pain in right leg: Secondary | ICD-10-CM | POA: Diagnosis present

## 2017-11-08 LAB — CBC WITH DIFFERENTIAL/PLATELET
ABS IMMATURE GRANULOCYTES: 0.02 10*3/uL (ref 0.00–0.07)
BASOS ABS: 0 10*3/uL (ref 0.0–0.1)
Basophils Relative: 0 %
Eosinophils Absolute: 1.1 10*3/uL — ABNORMAL HIGH (ref 0.0–0.5)
Eosinophils Relative: 12 %
HCT: 27.6 % — ABNORMAL LOW (ref 39.0–52.0)
HEMOGLOBIN: 8.1 g/dL — AB (ref 13.0–17.0)
IMMATURE GRANULOCYTES: 0 %
LYMPHS ABS: 2.3 10*3/uL (ref 0.7–4.0)
Lymphocytes Relative: 26 %
MCH: 22.3 pg — AB (ref 26.0–34.0)
MCHC: 29.3 g/dL — AB (ref 30.0–36.0)
MCV: 75.8 fL — AB (ref 80.0–100.0)
Monocytes Absolute: 0.6 10*3/uL (ref 0.1–1.0)
Monocytes Relative: 7 %
NEUTROS ABS: 4.9 10*3/uL (ref 1.7–7.7)
NRBC: 0 % (ref 0.0–0.2)
Neutrophils Relative %: 55 %
PLATELETS: 502 10*3/uL — AB (ref 150–400)
RBC: 3.64 MIL/uL — AB (ref 4.22–5.81)
RDW: 19.6 % — AB (ref 11.5–15.5)
WBC: 8.9 10*3/uL (ref 4.0–10.5)

## 2017-11-08 LAB — COMPREHENSIVE METABOLIC PANEL
ALBUMIN: 2.7 g/dL — AB (ref 3.5–5.0)
ALT: 31 U/L (ref 0–44)
ANION GAP: 9 (ref 5–15)
AST: 33 U/L (ref 15–41)
Alkaline Phosphatase: 69 U/L (ref 38–126)
BILIRUBIN TOTAL: 0.4 mg/dL (ref 0.3–1.2)
BUN: 15 mg/dL (ref 6–20)
CHLORIDE: 101 mmol/L (ref 98–111)
CO2: 25 mmol/L (ref 22–32)
Calcium: 8.9 mg/dL (ref 8.9–10.3)
Creatinine, Ser: 0.67 mg/dL (ref 0.61–1.24)
GFR calc Af Amer: >60 mL/min (ref >60)
GLUCOSE: 107 mg/dL — AB (ref 70–99)
POTASSIUM: 4.5 mmol/L (ref 3.5–5.1)
Sodium: 135 mmol/L (ref 135–145)
TOTAL PROTEIN: 7.6 g/dL (ref 6.5–8.1)

## 2017-11-08 LAB — AEROBIC/ANAEROBIC CULTURE (SURGICAL/DEEP WOUND)

## 2017-11-08 LAB — AEROBIC/ANAEROBIC CULTURE W GRAM STAIN (SURGICAL/DEEP WOUND)

## 2017-11-08 LAB — RAPID URINE DRUG SCREEN, HOSP PERFORMED
Amphetamines: NOT DETECTED
Barbiturates: NOT DETECTED
Benzodiazepines: NOT DETECTED
COCAINE: NOT DETECTED
OPIATES: POSITIVE — AB
TETRAHYDROCANNABINOL: NOT DETECTED

## 2017-11-08 LAB — SALICYLATE LEVEL

## 2017-11-08 LAB — ACETAMINOPHEN LEVEL

## 2017-11-08 LAB — ETHANOL: Alcohol, Ethyl (B): <10 mg/dL (ref <10)

## 2017-11-08 NOTE — ED Triage Notes (Signed)
Pt arrived via [facility]; pt from hm with c/o RBKA pain with no relief of oxycodone that was prescribed; pt may be a smoker per EMS; unknown if patient has taken entire bottle oxycodone; 134/70; 98; 100% on RA; 22; CBG 123

## 2017-11-08 NOTE — ED Notes (Signed)
Pt discharged from ED; instructions provided; Pt encouraged to return to ED if symptoms worsen and to f/u with PCP; Pt verbalized understanding of all instructions 
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
## 2017-11-08 NOTE — ED Provider Notes (Signed)
Patient seen/examined in the Emergency Department in conjunction with Midlevel Provider [facility] Patient presents with worsening right leg pain, and has been taking percocet without relief.  He recently had AKA Exam : awake/alert, anxious, right AKA stump appears clean/dry/intact Plan: due to taking unknown quantity of pain meds, will check tox labs.      Ripley Fraise, MD 11/08/17 [phone]

## 2017-11-08 NOTE — Discharge Instructions (Signed)
Labs and x-ray today looked great.  No acute findings. You can call your surgeon about any further pain medication. You can return here for any new/acute changes.

## 2017-11-08 NOTE — ED Notes (Signed)
2 RN's attempted unsuccessfully to start IV.

## 2017-11-09 ENCOUNTER — Telehealth: Payer: Self-pay | Admitting: Vascular Surgery

## 2017-11-09 ENCOUNTER — Encounter: Payer: Medicaid Other | Admitting: Vascular Surgery

## 2017-11-09 NOTE — Telephone Encounter (Signed)
-----   Message from Dagoberto Ligas, PA-C sent at 11/06/2017 10:02 AM EDT -----  Can you schedule an appt for this pt in 4-6 weeks for staple removal to see Dr. Donnetta Hutching.  PO R AKA. Thanks, [facility]

## 2017-11-09 NOTE — Telephone Encounter (Signed)
sch appt spk to pt mld ltr 12/21/17 3pm staple removal

## 2017-11-10 ENCOUNTER — Telehealth: Payer: Self-pay | Admitting: *Deleted

## 2017-11-10 NOTE — Telephone Encounter (Signed)
Patient called requesting pain medication. Rx for Percocet 30 prescribed on 11/06/17. Explained could not give anymore at this time.

## 2017-11-13 LAB — CULTURE, BLOOD (ROUTINE X 2)
CULTURE: NO GROWTH
Culture: NO GROWTH

## 2017-11-15 ENCOUNTER — Ambulatory Visit: Payer: Self-pay | Admitting: Family

## 2017-11-15 ENCOUNTER — Telehealth: Payer: Self-pay | Admitting: *Deleted

## 2017-11-15 NOTE — Telephone Encounter (Signed)
Patient called and c/o pain and swelling past falling on stump this am. Denies any open areas or bleeding. Refused to go to the ER today. Agreed to be checked by NP tomorrow.  He claims to have an aid that can bring him. Time given. Made clear to patient that (he has called mx. Times for narcotics) this is not an appointment for narcotic Rx. Instructed to report to the ER for acute or worsening condition.

## 2017-11-16 ENCOUNTER — Encounter: Payer: Self-pay | Admitting: Family

## 2017-11-16 ENCOUNTER — Ambulatory Visit (INDEPENDENT_AMBULATORY_CARE_PROVIDER_SITE_OTHER): Payer: Self-pay | Admitting: Family

## 2017-11-16 VITALS — BP 104/64 | HR 103 | Temp 97.5°F | Resp 20 | Ht 73.0 in | Wt 139.0 lb

## 2017-11-16 DIAGNOSIS — Z89611 Acquired absence of right leg above knee: Secondary | ICD-10-CM

## 2017-11-16 DIAGNOSIS — F1910 Other psychoactive substance abuse, uncomplicated: Secondary | ICD-10-CM

## 2017-11-16 DIAGNOSIS — I779 Disorder of arteries and arterioles, unspecified: Secondary | ICD-10-CM

## 2017-11-16 DIAGNOSIS — F17200 Nicotine dependence, unspecified, uncomplicated: Secondary | ICD-10-CM

## 2017-11-16 DIAGNOSIS — Z95828 Presence of other vascular implants and grafts: Secondary | ICD-10-CM
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
## 2017-11-16 NOTE — Patient Instructions (Signed)
Steps to Quit Smoking Smoking tobacco can be bad for your health. It can also affect almost every organ in your body. Smoking puts you and people around you at risk for many serious long-lasting (chronic) diseases. Quitting smoking is hard, but it is one of the best things that you can do for your health. It is never too late to quit. What are the benefits of quitting smoking? When you quit smoking, you lower your risk for getting serious diseases and conditions. They can include:  Lung cancer or lung disease.  Heart disease.  Stroke.  Heart attack.  Not being able to have children (infertility).  Weak bones (osteoporosis) and broken bones (fractures).  If you have coughing, wheezing, and shortness of breath, those symptoms may get better when you quit. You may also get sick less often. If you are pregnant, quitting smoking can help to lower your chances of having a baby of low birth weight. What can I do to help me quit smoking? Talk with your doctor about what can help you quit smoking. Some things you can do (strategies) include:  Quitting smoking totally, instead of slowly cutting back how much you smoke over a period of time.  Going to in-person counseling. You are more likely to quit if you go to many counseling sessions.  Using resources and support systems, such as: ? Online chats with a counselor. ? Phone quitlines. ? Printed self-help materials. ? Support groups or group counseling. ? Text messaging programs. ? Mobile phone apps or applications.  Taking medicines. Some of these medicines may have nicotine in them. If you are pregnant or breastfeeding, do not take any medicines to quit smoking unless your doctor says it is okay. Talk with your doctor about counseling or other things that can help you.  Talk with your doctor about using more than one strategy at the same time, such as taking medicines while you are also going to in-person counseling. This can help make  quitting easier. What things can I do to make it easier to quit? Quitting smoking might feel very hard at first, but there is a lot that you can do to make it easier. Take these steps:  Talk to your family and friends. Ask them to support and encourage you.  Call phone quitlines, reach out to support groups, or work with a counselor.  Ask people who smoke to not smoke around you.  Avoid places that make you want (trigger) to smoke, such as: ? Bars. ? Parties. ? Smoke-break areas at work.  Spend time with people who do not smoke.  Lower the stress in your life. Stress can make you want to smoke. Try these things to help your stress: ? Getting regular exercise. ? Deep-breathing exercises. ? Yoga. ? Meditating. ? Doing a body scan. To do this, close your eyes, focus on one area of your body at a time from head to toe, and notice which parts of your body are tense. Try to relax the muscles in those areas.  Download or buy apps on your mobile phone or tablet that can help you stick to your quit plan. There are many free apps, such as QuitGuide from the CDC (Centers for Disease Control and Prevention). You can find more support from smokefree.gov and other websites.  This information is not intended to replace advice given to you by your health care provider. Make sure you discuss any questions you have with your health care provider. Document Released: 10/25/2008 Document   Revised: 08/27/2015 Document Reviewed: 05/15/2014 Elsevier Interactive Patient Education  2018 Elsevier Inc.     Peripheral Vascular Disease Peripheral vascular disease (PVD) is a disease of the blood vessels that are not part of your heart and brain. A simple term for PVD is poor circulation. In most cases, PVD narrows the blood vessels that carry blood from your heart to the rest of your body. This can result in a decreased supply of blood to your arms, legs, and internal organs, like your stomach or kidneys.  However, it most often affects a person's lower legs and feet. There are two types of PVD.  Organic PVD. This is the more common type. It is caused by damage to the structure of blood vessels.  Functional PVD. This is caused by conditions that make blood vessels contract and tighten (spasm).  Without treatment, PVD tends to get worse over time. PVD can also lead to acute ischemic limb. This is when an arm or limb suddenly has trouble getting enough blood. This is a medical emergency. Follow these instructions at home:  Take medicines only as told by your doctor.  Do not use any tobacco products, including cigarettes, chewing tobacco, or electronic cigarettes. If you need help quitting, ask your doctor.  Lose weight if you are overweight, and maintain a healthy weight as told by your doctor.  Eat a diet that is low in fat and cholesterol. If you need help, ask your doctor.  Exercise regularly. Ask your doctor for some good activities for you.  Take good care of your feet. ? Wear comfortable shoes that fit well. ? Check your feet often for any cuts or sores. Contact a doctor if:  You have cramps in your legs while walking.  You have leg pain when you are at rest.  You have coldness in a leg or foot.  Your skin changes.  You are unable to get or have an erection (erectile dysfunction).  You have cuts or sores on your feet that are not healing. Get help right away if:  Your arm or leg turns cold and blue.  Your arms or legs become red, warm, swollen, painful, or numb.  You have chest pain or trouble breathing.  You suddenly have weakness in your face, arm, or leg.  You become very confused or you cannot speak.  You suddenly have a very bad headache.  You suddenly cannot see. This information is not intended to replace advice given to you by your health care provider. Make sure you discuss any questions you have with your health care provider. Document Released:  03/25/2009 Document Revised: 06/06/2015 Document Reviewed: 06/08/2013 Elsevier Interactive Patient Education  2017 Elsevier Inc.  

## 2017-11-17 ENCOUNTER — Other Ambulatory Visit: Payer: Self-pay | Admitting: Family Medicine

## 2017-11-17 DIAGNOSIS — R112 Nausea with vomiting, unspecified: Secondary | ICD-10-CM

## 2017-11-17 NOTE — Telephone Encounter (Signed)
1) Medication(s) Requested (by name): hydrOxyzine Ondansetron 2) Pharmacy of Choice:  [facility]

## 2017-11-19 MED ORDER — HYDROXYZINE PAMOATE 25 MG PO CAPS: 25.0000 mg | ORAL_CAPSULE | Freq: Two times a day (BID) | ORAL | 1 refills | Status: DC

## 2017-11-19 MED ORDER — ONDANSETRON HCL 4 MG PO TABS: 4.0000 mg | ORAL_TABLET | Freq: Three times a day (TID) | ORAL | 0 refills | Status: DC | PRN

## 2017-11-19 MED FILL — CLOPIDOGREL 75 MG TABLET: 75 | 30 days supply | Qty: 30 | Fill #0

## 2017-11-19 MED FILL — ONDANSETRON HCL 8 MG TABLET: 8 | 6 days supply | Qty: 20 | Fill #0

## 2017-11-19 MED FILL — HYDROXYZINE PAM 25 MG CAP: 25 | 5 days supply | Qty: 10 | Fill #0 | Status: TO

## 2017-11-23 ENCOUNTER — Other Ambulatory Visit: Payer: Self-pay | Admitting: Vascular Surgery

## 2017-11-29 ENCOUNTER — Telehealth: Payer: Self-pay

## 2017-11-29 ENCOUNTER — Telehealth: Payer: Self-pay | Admitting: Vascular Surgery

## 2017-11-29 NOTE — Telephone Encounter (Signed)
Estill Bamberg with [facility] called regarding this pt.  She thinks he's had recent active bleeding over incision site because it is red.  Has scab formation.  She thinks it may be infected.  I tried calling her to get more information but there was no answer.  I called both Estill Bamberg and Mr. Griffie and left a voice msg for them to return my call.     Thurston Hole., LPN

## 2017-11-29 NOTE — Telephone Encounter (Signed)
No further notes

## 2017-11-30 ENCOUNTER — Encounter (HOSPITAL_COMMUNITY): Payer: Self-pay | Admitting: Internal Medicine

## 2017-11-30 ENCOUNTER — Other Ambulatory Visit: Payer: Self-pay

## 2017-11-30 ENCOUNTER — Ambulatory Visit: Payer: Self-pay | Admitting: Vascular Surgery

## 2017-11-30 ENCOUNTER — Emergency Department (HOSPITAL_COMMUNITY): Payer: Medicaid Other

## 2017-11-30 ENCOUNTER — Inpatient Hospital Stay (HOSPITAL_COMMUNITY)
Admission: EM | Admit: 2017-11-30 | Discharge: 2017-12-02 | DRG: 580 | Disposition: A | Discharge status: Home Health | Payer: Medicaid Other | Attending: Internal Medicine | Admitting: Internal Medicine

## 2017-11-30 ENCOUNTER — Telehealth: Payer: Self-pay

## 2017-11-30 DIAGNOSIS — G4730 Sleep apnea, unspecified: Secondary | ICD-10-CM | POA: Diagnosis not present

## 2017-11-30 DIAGNOSIS — G894 Chronic pain syndrome: Secondary | ICD-10-CM | POA: Diagnosis present

## 2017-11-30 DIAGNOSIS — Z201 Contact with and (suspected) exposure to tuberculosis: Secondary | ICD-10-CM | POA: Diagnosis present

## 2017-11-30 DIAGNOSIS — D509 Iron deficiency anemia, unspecified: Secondary | ICD-10-CM | POA: Diagnosis present

## 2017-11-30 DIAGNOSIS — I10 Essential (primary) hypertension: Secondary | ICD-10-CM | POA: Diagnosis present

## 2017-11-30 DIAGNOSIS — Z9111 Patient's noncompliance with dietary regimen: Secondary | ICD-10-CM

## 2017-11-30 DIAGNOSIS — I5032 Chronic diastolic (congestive) heart failure: Secondary | ICD-10-CM | POA: Diagnosis present

## 2017-11-30 DIAGNOSIS — Z808 Family history of malignant neoplasm of other organs or systems: Secondary | ICD-10-CM

## 2017-11-30 DIAGNOSIS — J449 Chronic obstructive pulmonary disease, unspecified: Secondary | ICD-10-CM | POA: Diagnosis not present

## 2017-11-30 DIAGNOSIS — F1910 Other psychoactive substance abuse, uncomplicated: Secondary | ICD-10-CM | POA: Diagnosis present

## 2017-11-30 DIAGNOSIS — Z89511 Acquired absence of right leg below knee: Secondary | ICD-10-CM

## 2017-11-30 DIAGNOSIS — E785 Hyperlipidemia, unspecified: Secondary | ICD-10-CM | POA: Diagnosis not present

## 2017-11-30 DIAGNOSIS — N179 Acute kidney failure, unspecified: Secondary | ICD-10-CM | POA: Diagnosis not present

## 2017-11-30 DIAGNOSIS — R112 Nausea with vomiting, unspecified: Secondary | ICD-10-CM

## 2017-11-30 DIAGNOSIS — Z8249 Family history of ischemic heart disease and other diseases of the circulatory system: Secondary | ICD-10-CM

## 2017-11-30 DIAGNOSIS — F17210 Nicotine dependence, cigarettes, uncomplicated: Secondary | ICD-10-CM | POA: Diagnosis present

## 2017-11-30 DIAGNOSIS — Z8673 Personal history of transient ischemic attack (TIA), and cerebral infarction without residual deficits: Secondary | ICD-10-CM

## 2017-11-30 DIAGNOSIS — R296 Repeated falls: Secondary | ICD-10-CM | POA: Diagnosis present

## 2017-11-30 DIAGNOSIS — L03115 Cellulitis of right lower limb: Secondary | ICD-10-CM | POA: Diagnosis not present

## 2017-11-30 DIAGNOSIS — K5900 Constipation, unspecified: Secondary | ICD-10-CM | POA: Diagnosis present

## 2017-11-30 DIAGNOSIS — K08409 Partial loss of teeth, unspecified cause, unspecified class: Secondary | ICD-10-CM | POA: Diagnosis present

## 2017-11-30 DIAGNOSIS — M9689 Other intraoperative and postprocedural complications and disorders of the musculoskeletal system: Secondary | ICD-10-CM | POA: Diagnosis not present

## 2017-11-30 DIAGNOSIS — Z87892 Personal history of anaphylaxis: Secondary | ICD-10-CM

## 2017-11-30 DIAGNOSIS — M069 Rheumatoid arthritis, unspecified: Secondary | ICD-10-CM | POA: Diagnosis present

## 2017-11-30 DIAGNOSIS — Z800 Family history of malignant neoplasm of digestive organs: Secondary | ICD-10-CM

## 2017-11-30 DIAGNOSIS — E119 Type 2 diabetes mellitus without complications: Secondary | ICD-10-CM

## 2017-11-30 DIAGNOSIS — T8781 Dehiscence of amputation stump: Secondary | ICD-10-CM | POA: Diagnosis present

## 2017-11-30 DIAGNOSIS — T8149XA Infection following a procedure, other surgical site, initial encounter: Secondary | ICD-10-CM | POA: Diagnosis not present

## 2017-11-30 DIAGNOSIS — Z91013 Allergy to seafood: Secondary | ICD-10-CM

## 2017-11-30 DIAGNOSIS — K219 Gastro-esophageal reflux disease without esophagitis: Secondary | ICD-10-CM | POA: Diagnosis not present

## 2017-11-30 DIAGNOSIS — Z87442 Personal history of urinary calculi: Secondary | ICD-10-CM | POA: Diagnosis not present

## 2017-11-30 DIAGNOSIS — Z8701 Personal history of pneumonia (recurrent): Secondary | ICD-10-CM | POA: Diagnosis not present

## 2017-11-30 DIAGNOSIS — Z720 Tobacco use: Secondary | ICD-10-CM | POA: Diagnosis present

## 2017-11-30 DIAGNOSIS — Z885 Allergy status to narcotic agent status: Secondary | ICD-10-CM

## 2017-11-30 DIAGNOSIS — E1151 Type 2 diabetes mellitus with diabetic peripheral angiopathy without gangrene: Secondary | ICD-10-CM | POA: Diagnosis not present

## 2017-11-30 DIAGNOSIS — Z86711 Personal history of pulmonary embolism: Secondary | ICD-10-CM

## 2017-11-30 DIAGNOSIS — Z96662 Presence of left artificial ankle joint: Secondary | ICD-10-CM | POA: Diagnosis present

## 2017-11-30 DIAGNOSIS — I110 Hypertensive heart disease with heart failure: Secondary | ICD-10-CM | POA: Diagnosis not present

## 2017-11-30 DIAGNOSIS — B1920 Unspecified viral hepatitis C without hepatic coma: Secondary | ICD-10-CM | POA: Diagnosis present

## 2017-11-30 DIAGNOSIS — Z888 Allergy status to other drugs, medicaments and biological substances status: Secondary | ICD-10-CM

## 2017-11-30 HISTORY — DX: Cellulitis of right lower limb: L03.115

## 2017-11-30 LAB — GLUCOSE, CAPILLARY
GLUCOSE-CAPILLARY: 124 mg/dL — AB (ref 70–99)
Glucose-Capillary: 128 mg/dL — ABNORMAL HIGH (ref 70–99)

## 2017-11-30 LAB — CBC WITH DIFFERENTIAL/PLATELET
ABS IMMATURE GRANULOCYTES: 0.03 10*3/uL (ref 0.00–0.07)
Basophils Absolute: 0.1 10*3/uL (ref 0.0–0.1)
Basophils Relative: 1 %
EOS PCT: 6 %
Eosinophils Absolute: 0.5 10*3/uL (ref 0.0–0.5)
HEMATOCRIT: 39.4 % (ref 39.0–52.0)
HEMOGLOBIN: 11.6 g/dL — AB (ref 13.0–17.0)
Immature Granulocytes: 0 %
LYMPHS ABS: 2.9 10*3/uL (ref 0.7–4.0)
Lymphocytes Relative: 30 %
MCH: 22.3 pg — ABNORMAL LOW (ref 26.0–34.0)
MCHC: 29.4 g/dL — AB (ref 30.0–36.0)
MCV: 75.8 fL — ABNORMAL LOW (ref 80.0–100.0)
Monocytes Absolute: 0.6 10*3/uL (ref 0.1–1.0)
Monocytes Relative: 7 %
NEUTROS PCT: 56 %
NRBC: 0 % (ref 0.0–0.2)
Neutro Abs: 5.4 10*3/uL (ref 1.7–7.7)
Platelets: 353 10*3/uL (ref 150–400)
RBC: 5.2 MIL/uL (ref 4.22–5.81)
RDW: 19.5 % — ABNORMAL HIGH (ref 11.5–15.5)
WBC: 9.5 10*3/uL (ref 4.0–10.5)

## 2017-11-30 LAB — BASIC METABOLIC PANEL
ANION GAP: 7 (ref 5–15)
BUN: 20 mg/dL (ref 6–20)
CALCIUM: 9.2 mg/dL (ref 8.9–10.3)
CHLORIDE: 106 mmol/L (ref 98–111)
CO2: 23 mmol/L (ref 22–32)
Creatinine, Ser: 1.32 mg/dL — ABNORMAL HIGH (ref 0.61–1.24)
GFR calc non Af Amer: 57 mL/min — ABNORMAL LOW (ref >60)
Glucose, Bld: 79 mg/dL (ref 70–99)
POTASSIUM: 4.6 mmol/L (ref 3.5–5.1)
Sodium: 136 mmol/L (ref 135–145)

## 2017-11-30 LAB — I-STAT CHEM 8, ED
BUN: 24 mg/dL — AB (ref 6–20)
CHLORIDE: 105 mmol/L (ref 98–111)
CREATININE: 1.4 mg/dL — AB (ref 0.61–1.24)
Calcium, Ion: 1.15 mmol/L (ref 1.15–1.40)
Glucose, Bld: 115 mg/dL — ABNORMAL HIGH (ref 70–99)
HEMATOCRIT: 39 % (ref 39.0–52.0)
Hemoglobin: 13.3 g/dL (ref 13.0–17.0)
Potassium: 4.4 mmol/L (ref 3.5–5.1)
Sodium: 138 mmol/L (ref 135–145)
TCO2: 28 mmol/L (ref 22–32)

## 2017-11-30 MED ORDER — DOXYCYCLINE HYCLATE 100 MG PO CAPS: 100.0000 mg | ORAL_CAPSULE | Freq: Two times a day (BID) | ORAL | 0 refills | Status: DC

## 2017-11-30 MED ORDER — OXYCODONE HCL 5 MG PO TABS
10.0000 mg | ORAL_TABLET | Freq: Once | ORAL | Status: AC
  Administered 2017-11-30: 10 mg via ORAL
  Filled 2017-11-30: qty 2

## 2017-11-30 MED ORDER — SODIUM CHLORIDE 0.9 % IV BOLUS
1000.0000 mL | Freq: Once | INTRAVENOUS | Status: AC
  Administered 2017-11-30: 1000 mL via INTRAVENOUS

## 2017-11-30 MED ORDER — FENTANYL CITRATE (PF) 100 MCG/2ML IJ SOLN
50.0000 ug | Freq: Once | INTRAMUSCULAR | Status: AC
  Administered 2017-11-30: 50 ug via INTRAVENOUS
  Filled 2017-11-30: qty 2

## 2017-11-30 MED ORDER — ATORVASTATIN CALCIUM 80 MG PO TABS
80.0000 mg | ORAL_TABLET | Freq: Every day | ORAL | Status: DC
  Administered 2017-11-30 – 2017-12-01 (×2): 80 mg via ORAL
  Filled 2017-11-30 (×2): qty 1

## 2017-11-30 MED ORDER — FLUTICASONE PROPIONATE 50 MCG/ACT NA SUSP
2.0000 | Freq: Every day | NASAL | Status: DC
  Administered 2017-11-30 – 2017-12-02 (×3): 2 via NASAL
  Filled 2017-11-30: qty 16

## 2017-11-30 MED ORDER — HEPARIN SODIUM (PORCINE) 5000 UNIT/ML IJ SOLN
5000.0000 [IU] | Freq: Three times a day (TID) | INTRAMUSCULAR | Status: DC
  Administered 2017-11-30 – 2017-12-02 (×5): 5000 [IU] via SUBCUTANEOUS
  Filled 2017-11-30 (×6): qty 1

## 2017-11-30 MED ORDER — HYDROXYZINE HCL 25 MG PO TABS
25.0000 mg | ORAL_TABLET | Freq: Once | ORAL | Status: AC
  Administered 2017-11-30: 25 mg via ORAL
  Filled 2017-11-30: qty 1

## 2017-11-30 MED ORDER — NICOTINE 21 MG/24HR TD PT24
21.0000 mg | MEDICATED_PATCH | Freq: Every day | TRANSDERMAL | Status: DC
  Administered 2017-11-30 – 2017-12-02 (×3): 21 mg via TRANSDERMAL
  Filled 2017-11-30 (×3): qty 1

## 2017-11-30 MED ORDER — CLOPIDOGREL BISULFATE 75 MG PO TABS
75.0000 mg | ORAL_TABLET | Freq: Every day | ORAL | Status: DC
  Administered 2017-12-01 – 2017-12-02 (×2): 75 mg via ORAL
  Filled 2017-11-30 (×2): qty 1

## 2017-11-30 MED ORDER — ONDANSETRON 4 MG PO TBDP: 4.0000 mg | ORAL_TABLET | Freq: Three times a day (TID) | ORAL | 0 refills | Status: DC | PRN

## 2017-11-30 MED ORDER — ONDANSETRON HCL 4 MG PO TABS: 4.0000 mg | ORAL_TABLET | Freq: Four times a day (QID) | ORAL | Status: DC | PRN

## 2017-11-30 MED ORDER — BISACODYL 10 MG RE SUPP: 10.0000 mg | Freq: Every day | RECTAL | Status: DC | PRN

## 2017-11-30 MED ORDER — OXYCODONE-ACETAMINOPHEN 10-325 MG PO TABS: 1.0000 | ORAL_TABLET | ORAL | Status: DC | PRN

## 2017-11-30 MED ORDER — CEFAZOLIN SODIUM-DEXTROSE 1-4 GM/50ML-% IV SOLN
1.0000 g | Freq: Three times a day (TID) | INTRAVENOUS | Status: DC
  Administered 2017-11-30 – 2017-12-02 (×7): 1 g via INTRAVENOUS
  Filled 2017-11-30 (×8): qty 50

## 2017-11-30 MED ORDER — ACETAMINOPHEN 500 MG PO TABS
1000.0000 mg | ORAL_TABLET | Freq: Once | ORAL | Status: AC
  Administered 2017-11-30: 1000 mg via ORAL
  Filled 2017-11-30: qty 2

## 2017-11-30 MED ORDER — INSULIN ASPART 100 UNIT/ML ~~LOC~~ SOLN
0.0000 [IU] | Freq: Three times a day (TID) | SUBCUTANEOUS | Status: DC
  Administered 2017-11-30: 2 [IU] via SUBCUTANEOUS
  Administered 2017-12-02: 8 [IU] via SUBCUTANEOUS
  Administered 2017-12-02: 2 [IU] via SUBCUTANEOUS

## 2017-11-30 MED ORDER — OXYCODONE-ACETAMINOPHEN 5-325 MG PO TABS
1.0000 | ORAL_TABLET | ORAL | Status: DC | PRN
  Administered 2017-11-30 – 2017-12-01 (×5): 1 via ORAL
  Filled 2017-11-30 (×5): qty 1

## 2017-11-30 MED ORDER — PANTOPRAZOLE SODIUM 20 MG PO TBEC
20.0000 mg | DELAYED_RELEASE_TABLET | Freq: Every day | ORAL | Status: DC
  Administered 2017-12-01 – 2017-12-02 (×2): 20 mg via ORAL
  Filled 2017-11-30 (×2): qty 1

## 2017-11-30 MED ORDER — ONDANSETRON HCL 4 MG/2ML IJ SOLN
4.0000 mg | Freq: Four times a day (QID) | INTRAMUSCULAR | Status: DC | PRN
  Administered 2017-11-30 – 2017-12-01 (×2): 4 mg via INTRAVENOUS
  Filled 2017-11-30: qty 2

## 2017-11-30 MED ORDER — PREGABALIN 75 MG PO CAPS
75.0000 mg | ORAL_CAPSULE | Freq: Three times a day (TID) | ORAL | Status: DC
  Administered 2017-11-30 – 2017-12-02 (×4): 75 mg via ORAL
  Filled 2017-11-30 (×5): qty 1

## 2017-11-30 MED ORDER — ONDANSETRON HCL 4 MG/2ML IJ SOLN: 4.0000 mg | Freq: Once | INTRAMUSCULAR | Status: DC

## 2017-11-30 MED ORDER — VANCOMYCIN HCL 10 G IV SOLR
1250.0000 mg | Freq: Once | INTRAVENOUS | Status: DC
  Administered 2017-11-30: 1250 mg via INTRAVENOUS
  Filled 2017-11-30: qty 1250

## 2017-11-30 MED ORDER — FENTANYL CITRATE (PF) 100 MCG/2ML IJ SOLN
100.0000 ug | Freq: Once | INTRAMUSCULAR | Status: AC
  Administered 2017-11-30: 100 ug via INTRAVENOUS
  Filled 2017-11-30: qty 2

## 2017-11-30 MED ORDER — ONDANSETRON HCL 4 MG/2ML IJ SOLN
4.0000 mg | Freq: Once | INTRAMUSCULAR | Status: AC
  Administered 2017-11-30: 4 mg via INTRAVENOUS
  Filled 2017-11-30: qty 2

## 2017-11-30 MED ORDER — HYDROXYZINE HCL 25 MG PO TABS
25.0000 mg | ORAL_TABLET | ORAL | Status: DC | PRN
  Administered 2017-12-01 – 2017-12-02 (×3): 25 mg via ORAL
  Filled 2017-11-30 (×3): qty 1

## 2017-11-30 MED ORDER — NICOTINE 21 MG/24HR TD PT24: 21.0000 mg | MEDICATED_PATCH | Freq: Every day | TRANSDERMAL | Status: DC

## 2017-11-30 MED ORDER — ALBUTEROL SULFATE (2.5 MG/3ML) 0.083% IN NEBU: 2.5000 mg | INHALATION_SOLUTION | Freq: Four times a day (QID) | RESPIRATORY_TRACT | Status: DC | PRN

## 2017-11-30 MED ORDER — SODIUM CHLORIDE 0.9 % IV SOLN
1.0000 g | Freq: Once | INTRAVENOUS | Status: AC
  Administered 2017-11-30: 1 g via INTRAVENOUS
  Filled 2017-11-30: qty 10

## 2017-11-30 MED ORDER — OXYCODONE HCL 5 MG PO TABS
5.0000 mg | ORAL_TABLET | ORAL | Status: DC | PRN
  Administered 2017-12-01 (×4): 5 mg via ORAL
  Filled 2017-11-30 (×5): qty 1

## 2017-11-30 NOTE — ED Triage Notes (Signed)
Pt BIB EMS c/o redness and pain in right BKA x2 days. BKA done 2 weeks ago at [facility]. Denies fever/shortness of breath. Pain 10/10.

## 2017-11-30 NOTE — Telephone Encounter (Signed)
Returned patient's phone call from this morning stating that the redness is spreading and he is in pain. He stated that he could not wait for his office appointment and that he called an ambulance and was waiting for them to arrive.

## 2017-11-30 NOTE — ED Notes (Signed)
Patient refused antibiotics until he received pain medication.

## 2017-11-30 NOTE — ED Notes (Signed)
Admitting Doctor at bedside 
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
## 2017-11-30 NOTE — ED Notes (Addendum)
Patient given water and [place] sandwich to eat/drink.

## 2017-11-30 NOTE — ED Notes (Signed)
Patient transported to X-ray 

## 2017-11-30 NOTE — ED Notes (Signed)
Notified admit doctor regarding stopping Vancomycin and gave an additional dose of Atarax per Dr Tyrone Nine.

## 2017-11-30 NOTE — ED Notes (Signed)
Attempted to call report at this time. Told that charge RN will call for report.

## 2017-11-30 NOTE — ED Notes (Signed)
Patient reported intermittent itching and would like medication to help not Benadryl.

## 2017-11-30 NOTE — ED Notes (Signed)
Pt returned to room from xray.

## 2017-11-30 NOTE — ED Notes (Signed)
Lab called stated to also send an anaerobic wound culture.

## 2017-11-30 NOTE — ED Notes (Signed)
Vascular surgery PA bedside

## 2017-11-30 NOTE — ED Notes (Signed)
Pt c/o nausea. New orders for 4mg  zofran by Dr. Tyrone Nine.

## 2017-11-30 NOTE — ED Notes (Signed)
Patient states, "Where is my pain medicine? I will leave and go get pain medicine if the doctor here does not give me any!" Dr. Tyrone Nine made aware of patient's wishes and no new orders at this time.

## 2017-11-30 NOTE — ED Notes (Signed)
Paged and spoke with admit doctor. Will order pain medication and will see patient in the ED shortly. Notified patient and agreed to stay and receive the pain medication along with antibiotics.

## 2017-11-30 NOTE — ED Notes (Signed)
Spoke with primary nurse who stated obtained wound culture and can start antibiotics now.
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
## 2017-11-30 NOTE — ED Notes (Addendum)
Patient states since the second antibiotic patient developed more itching and a rash on back and legs. Stopped medication. Doctor Tyrone Nine notified and ordered additional Atarax 25mg  PO since patient is allergic to benadryl.

## 2017-11-30 NOTE — ED Notes (Signed)
PAGED ADMITTING PER RN GREG

## 2017-12-01 ENCOUNTER — Inpatient Hospital Stay (HOSPITAL_COMMUNITY): Payer: Medicaid Other | Admitting: Certified Registered"

## 2017-12-01 ENCOUNTER — Encounter (HOSPITAL_COMMUNITY): Payer: Self-pay | Admitting: Certified Registered Nurse Anesthetist

## 2017-12-01 ENCOUNTER — Encounter (HOSPITAL_COMMUNITY)
Admission: EM | Disposition: A | Discharge status: Home Health | Payer: Self-pay | Source: Home / Self Care | Attending: Internal Medicine

## 2017-12-01 DIAGNOSIS — I10 Essential (primary) hypertension: Secondary | ICD-10-CM

## 2017-12-01 DIAGNOSIS — E785 Hyperlipidemia, unspecified: Secondary | ICD-10-CM

## 2017-12-01 DIAGNOSIS — M9689 Other intraoperative and postprocedural complications and disorders of the musculoskeletal system: Secondary | ICD-10-CM

## 2017-12-01 DIAGNOSIS — T8149XA Infection following a procedure, other surgical site, initial encounter: Secondary | ICD-10-CM

## 2017-12-01 DIAGNOSIS — L03115 Cellulitis of right lower limb: Principal | ICD-10-CM

## 2017-12-01 HISTORY — PX: AMPUTATION: SHX166

## 2017-12-01 LAB — GLUCOSE, CAPILLARY
GLUCOSE-CAPILLARY: 77 mg/dL (ref 70–99)
GLUCOSE-CAPILLARY: 84 mg/dL (ref 70–99)
GLUCOSE-CAPILLARY: 125 mg/dL — AB (ref 70–99)
GLUCOSE-CAPILLARY: 410 mg/dL — AB (ref 70–99)
GLUCOSE-CAPILLARY: 442 mg/dL — AB (ref 70–99)
GLUCOSE-CAPILLARY: 308 mg/dL — AB (ref 70–99)
Glucose-Capillary: 103 mg/dL — ABNORMAL HIGH (ref 70–99)

## 2017-12-01 LAB — CBC
HEMATOCRIT: 34 % — AB (ref 39.0–52.0)
HEMOGLOBIN: 9.7 g/dL — AB (ref 13.0–17.0)
MCH: 21.9 pg — AB (ref 26.0–34.0)
MCHC: 28.5 g/dL — AB (ref 30.0–36.0)
MCV: 76.9 fL — AB (ref 80.0–100.0)
Platelets: 310 10*3/uL (ref 150–400)
RBC: 4.42 MIL/uL (ref 4.22–5.81)
RDW: 19.6 % — ABNORMAL HIGH (ref 11.5–15.5)
WBC: 8.2 10*3/uL (ref 4.0–10.5)
nRBC: 0 % (ref 0.0–0.2)

## 2017-12-01 LAB — RETICULOCYTES
IMMATURE RETIC FRACT: 22.5 % — AB (ref 2.3–15.9)
RBC.: 4.42 MIL/uL (ref 4.22–5.81)
RETIC COUNT ABSOLUTE: 68.5 10*3/uL (ref 19.0–186.0)
Retic Ct Pct: 1.6 % (ref 0.4–3.1)

## 2017-12-01 LAB — COMPREHENSIVE METABOLIC PANEL
ALK PHOS: 93 U/L (ref 38–126)
ALT: 11 U/L (ref 0–44)
AST: 19 U/L (ref 15–41)
Albumin: 3.1 g/dL — ABNORMAL LOW (ref 3.5–5.0)
Anion gap: 8 (ref 5–15)
BUN: 22 mg/dL — ABNORMAL HIGH (ref 6–20)
CALCIUM: 8.7 mg/dL — AB (ref 8.9–10.3)
CO2: 22 mmol/L (ref 22–32)
Chloride: 106 mmol/L (ref 98–111)
Creatinine, Ser: 0.97 mg/dL (ref 0.61–1.24)
Glucose, Bld: 156 mg/dL — ABNORMAL HIGH (ref 70–99)
Potassium: 4.3 mmol/L (ref 3.5–5.1)
Sodium: 136 mmol/L (ref 135–145)
TOTAL PROTEIN: 6.9 g/dL (ref 6.5–8.1)

## 2017-12-01 LAB — FERRITIN: Ferritin: 17 ng/mL — ABNORMAL LOW (ref 24–336)

## 2017-12-01 LAB — FOLATE: FOLATE: 10.3 ng/mL (ref >5.9)

## 2017-12-01 LAB — VITAMIN B12: Vitamin B-12: 227 pg/mL (ref 180–914)

## 2017-12-01 LAB — IRON AND TIBC
IRON: 36 ug/dL — AB (ref 45–182)
Saturation Ratios: 9 % — ABNORMAL LOW (ref 17.9–39.5)
TIBC: 400 ug/dL (ref 250–450)
UIBC: 364 ug/dL

## 2017-12-01 LAB — MRSA PCR SCREENING: MRSA by PCR: NEGATIVE

## 2017-12-01 SURGERY — AMPUTATION, ABOVE KNEE
Anesthesia: General | Site: Leg Upper | Laterality: Right

## 2017-12-01 MED ORDER — PROPOFOL 10 MG/ML IV BOLUS
INTRAVENOUS | Status: DC | PRN
  Administered 2017-12-01: 100 mg via INTRAVENOUS
  Administered 2017-12-01 (×2): 50 mg via INTRAVENOUS

## 2017-12-01 MED ORDER — HYDROMORPHONE HCL 1 MG/ML IJ SOLN
1.0000 mg | Freq: Once | INTRAMUSCULAR | Status: AC
  Administered 2017-12-01: 1 mg via INTRAVENOUS
  Filled 2017-12-01: qty 1

## 2017-12-01 MED ORDER — MIDAZOLAM HCL 5 MG/5ML IJ SOLN
INTRAMUSCULAR | Status: DC | PRN
  Administered 2017-12-01: 2 mg via INTRAVENOUS

## 2017-12-01 MED ORDER — OXYCODONE-ACETAMINOPHEN 5-325 MG PO TABS
1.0000 | ORAL_TABLET | ORAL | Status: DC | PRN
  Administered 2017-12-02 (×4): 2 via ORAL
  Filled 2017-12-01 (×4): qty 2

## 2017-12-01 MED ORDER — PHENYLEPHRINE 40 MCG/ML (10ML) SYRINGE FOR IV PUSH (FOR BLOOD PRESSURE SUPPORT)
PREFILLED_SYRINGE | INTRAVENOUS | Status: DC | PRN
  Administered 2017-12-01 (×2): 120 ug via INTRAVENOUS
  Administered 2017-12-01: 200 ug via INTRAVENOUS

## 2017-12-01 MED ORDER — FENTANYL CITRATE (PF) 100 MCG/2ML IJ SOLN
INTRAMUSCULAR | Status: AC
  Filled 2017-12-01: qty 2

## 2017-12-01 MED ORDER — DEXAMETHASONE SODIUM PHOSPHATE 10 MG/ML IJ SOLN
INTRAMUSCULAR | Status: DC | PRN
  Administered 2017-12-01: 5 mg via INTRAVENOUS

## 2017-12-01 MED ORDER — MIDAZOLAM HCL 2 MG/2ML IJ SOLN
1.0000 mg | Freq: Once | INTRAMUSCULAR | Status: AC
  Administered 2017-12-01: 1 mg via INTRAVENOUS

## 2017-12-01 MED ORDER — MIDAZOLAM HCL 2 MG/2ML IJ SOLN
INTRAMUSCULAR | Status: AC
  Filled 2017-12-01: qty 2

## 2017-12-01 MED ORDER — 0.9 % SODIUM CHLORIDE (POUR BTL) OPTIME
TOPICAL | Status: DC | PRN
  Administered 2017-12-01: 1000 mL

## 2017-12-01 MED ORDER — EPHEDRINE SULFATE-NACL 50-0.9 MG/10ML-% IV SOSY
PREFILLED_SYRINGE | INTRAVENOUS | Status: DC | PRN
  Administered 2017-12-01: 10 mg via INTRAVENOUS

## 2017-12-01 MED ORDER — HYDROMORPHONE HCL 1 MG/ML IJ SOLN: 0.2500 mg | INTRAMUSCULAR | Status: DC | PRN

## 2017-12-01 MED ORDER — FENTANYL CITRATE (PF) 100 MCG/2ML IJ SOLN
INTRAMUSCULAR | Status: DC | PRN
  Administered 2017-12-01: 50 ug via INTRAVENOUS
  Administered 2017-12-01 (×2): 100 ug via INTRAVENOUS

## 2017-12-01 MED ORDER — LIDOCAINE 2% (20 MG/ML) 5 ML SYRINGE
INTRAMUSCULAR | Status: DC | PRN
  Administered 2017-12-01: 60 mg via INTRAVENOUS

## 2017-12-01 MED ORDER — LACTATED RINGERS IV SOLN
INTRAVENOUS | Status: DC | PRN
  Administered 2017-12-01: 16:00:00 via INTRAVENOUS

## 2017-12-01 MED ORDER — FENTANYL CITRATE (PF) 100 MCG/2ML IJ SOLN
100.0000 ug | Freq: Once | INTRAMUSCULAR | Status: AC
  Administered 2017-12-01: 100 ug via INTRAVENOUS

## 2017-12-01 MED ORDER — FENTANYL CITRATE (PF) 250 MCG/5ML IJ SOLN
INTRAMUSCULAR | Status: AC
  Filled 2017-12-01: qty 5

## 2017-12-01 MED ORDER — INSULIN ASPART 100 UNIT/ML ~~LOC~~ SOLN
7.0000 [IU] | Freq: Once | SUBCUTANEOUS | Status: AC
  Administered 2017-12-01: 7 [IU] via SUBCUTANEOUS

## 2017-12-01 MED ORDER — PROPOFOL 10 MG/ML IV BOLUS
INTRAVENOUS | Status: AC
  Filled 2017-12-01: qty 20

## 2017-12-01 SURGICAL SUPPLY — 46 items
BANDAGE ACE 4X5 VEL STRL LF (GAUZE/BANDAGES/DRESSINGS) ×1 IMPLANT
BANDAGE ACE 6X5 VEL STRL LF (GAUZE/BANDAGES/DRESSINGS) ×1 IMPLANT
BANDAGE ESMARK 6X9 LF (GAUZE/BANDAGES/DRESSINGS) IMPLANT
BLADE SAW GIGLI 510 (BLADE) ×1 IMPLANT
BLADE SAW GIGLI 510MM (BLADE)
BNDG CMPR 9X6 STRL LF SNTH (GAUZE/BANDAGES/DRESSINGS)
BNDG ESMARK 6X9 LF (GAUZE/BANDAGES/DRESSINGS)
BNDG GAUZE ELAST 4 BULKY (GAUZE/BANDAGES/DRESSINGS) ×6 IMPLANT
CANISTER SUCT 3000ML PPV (MISCELLANEOUS) ×3 IMPLANT
CLIP LIGATING EXTRA MED SLVR (CLIP) ×1 IMPLANT
CLIP LIGATING EXTRA SM BLUE (MISCELLANEOUS) ×1 IMPLANT
COVER SURGICAL LIGHT HANDLE (MISCELLANEOUS) ×6 IMPLANT
COVER WAND RF STERILE (DRAPES) ×3 IMPLANT
CUFF TOURNIQUET SINGLE 34IN LL (TOURNIQUET CUFF) IMPLANT
CUFF TOURNIQUET SINGLE 44IN (TOURNIQUET CUFF) IMPLANT
DRAIN SNY 10X20 3/4 PERF (WOUND CARE) IMPLANT
DRAPE HALF SHEET 40X57 (DRAPES) ×3 IMPLANT
DRAPE ORTHO SPLIT 77X108 STRL (DRAPES) ×6
DRAPE SURG ORHT 6 SPLT 77X108 (DRAPES) ×2 IMPLANT
ELECT CAUTERY BLADE 6.4 (BLADE) ×3 IMPLANT
ELECT REM PT RETURN 9FT ADLT (ELECTROSURGICAL) ×3
ELECTRODE REM PT RTRN 9FT ADLT (ELECTROSURGICAL) ×1 IMPLANT
EVACUATOR SILICONE 100CC (DRAIN) IMPLANT
GAUZE SPONGE 4X4 12PLY STRL (GAUZE/BANDAGES/DRESSINGS) ×4 IMPLANT
GAUZE SPONGE 4X4 12PLY STRL LF (GAUZE/BANDAGES/DRESSINGS) ×2 IMPLANT
GAUZE XEROFORM 5X9 LF (GAUZE/BANDAGES/DRESSINGS) ×1 IMPLANT
GLOVE SS BIOGEL STRL SZ 7.5 (GLOVE) ×1 IMPLANT
GLOVE SUPERSENSE BIOGEL SZ 7.5 (GLOVE) ×2
GOWN STRL REUS W/ TWL LRG LVL3 (GOWN DISPOSABLE) ×3 IMPLANT
GOWN STRL REUS W/TWL LRG LVL3 (GOWN DISPOSABLE) ×9
KIT BASIN OR (CUSTOM PROCEDURE TRAY) ×3 IMPLANT
KIT TURNOVER KIT B (KITS) ×3 IMPLANT
NS IRRIG 1000ML POUR BTL (IV SOLUTION) ×3 IMPLANT
PACK GENERAL/GYN (CUSTOM PROCEDURE TRAY) ×3 IMPLANT
PAD ARMBOARD 7.5X6 YLW CONV (MISCELLANEOUS) ×6 IMPLANT
PADDING CAST COTTON 6X4 STRL (CAST SUPPLIES) IMPLANT
STAPLER VISISTAT 35W (STAPLE) ×1 IMPLANT
STOCKINETTE IMPERVIOUS LG (DRAPES) ×1 IMPLANT
SUT ETHILON 3 0 PS 1 (SUTURE) IMPLANT
SUT VIC AB 0 CT1 18XCR BRD 8 (SUTURE) ×2 IMPLANT
SUT VIC AB 0 CT1 8-18 (SUTURE)
SUT VICRYL AB 2 0 TIES (SUTURE) ×1 IMPLANT
TAPE CLOTH SURG 4X10 WHT LF (GAUZE/BANDAGES/DRESSINGS) ×2 IMPLANT
TOWEL GREEN STERILE (TOWEL DISPOSABLE) ×6 IMPLANT
UNDERPAD 30X30 (UNDERPADS AND DIAPERS) ×3 IMPLANT
WATER STERILE IRR 1000ML POUR (IV SOLUTION) ×3 IMPLANT

## 2017-12-01 NOTE — Progress Notes (Signed)
   12/01/17 1600  Clinical Encounter Type  Visited With Health care provider;Patient not available  Visit Type Initial;Other (Comment) (AD)  Referral From Nurse   Responded to AD create/update request.  Called [facility], pt in OR.  If pt still desires to create/update AD after he has returned to floor from post-op, please create new consult request.  Myra Gianotti resident, [phone]

## 2017-12-01 NOTE — Plan of Care (Signed)
  Problem: Education: Goal: Knowledge of General Education information will improve Description Including pain rating scale, medication(s)/side effects and non-pharmacologic comfort measures Outcome: Progressing   Problem: Spiritual Needs Goal: Ability to function at adequate level Outcome: Progressing
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
## 2017-12-01 NOTE — Care Management Note (Addendum)
Case Management Note  Patient Details  Name: [name] MRN: [id number] Date of Birth: [birth year]  Subjective/Objective:                    Action/Plan: Patient currently in surgery.   Discharged 11/06/17 with [facility] for home health RN and PT. At that time patient refused SNF.   Await PT evaluation.   Also consult for medication assistance. Patient has Medicaid , co pays $3. Unable to assist with co pays.   Will continue to follow for PT recommendations and discharge medications. NCM has call into [place]. Spoke with Linna Hoff with [facility], due to staffing they can not take patient back at discharge. After last discharge [facility] called scheduled a home visit with patient , when [facility] arrived at patient's home , he was not there. [facility] called patient and he told [facility] he was going to a SNF the next day so he was discharged from [facility].  Expected Discharge Date:                  Expected Discharge Plan:     In-House Referral:     Discharge planning Services  CM Consult, Medication Assistance  Post Acute Care Choice:    Choice offered to:     DME Arranged:    DME Agency:     HH Arranged:    HH Agency:     Status of Service:  In process, will continue to follow  If discussed at Long Length of Stay Meetings, dates discussed:    Additional Comments:  Marilu Favre, RN 12/01/2017, 3:46 PM

## 2017-12-01 NOTE — Anesthesia Postprocedure Evaluation (Signed)
Anesthesia Post Note  Patient: [name]  Procedure(s) Performed: REVISION OF Right  ABOVE KNEE AMPUTATION (Right Leg Upper)     Patient location during evaluation: PACU Anesthesia Type: General Level of consciousness: awake and alert Pain management: pain level controlled Vital Signs Assessment: post-procedure vital signs reviewed and stable Respiratory status: spontaneous breathing, nonlabored ventilation and respiratory function stable Cardiovascular status: blood pressure returned to baseline and stable Postop Assessment: no apparent nausea or vomiting Anesthetic complications: no    Last Vitals:  Vitals:   12/01/17 1723 12/01/17 1738  BP: (!) 106/59 (!) 101/52  Pulse: 88 89  Resp: 11 11  Temp:  (!) 36.3 C  SpO2: 94% 95%    Last Pain:  Vitals:   12/01/17 1738  TempSrc:   PainSc: Asleep                 Hagar Sadiq,W. EDMOND

## 2017-12-01 NOTE — Progress Notes (Addendum)
Progress Note    12/01/2017 8:07 AM  Subjective:  Asking for pain medication   Vitals:   12/01/17 0118 12/01/17 0707  BP:  (!) 154/76  Pulse:  90  Resp:  16  Temp: 98.4 F (36.9 C) 98.3 F (36.8 C)  SpO2:  100%   Physical Exam: Lungs:  Non labored Incisions:  Lateral R AKA incision skin thinning now with some further drainage; palpable fluid pocket Neurologic: A&O  CBC    Component Value Date/Time   WBC 8.2 12/01/2017 0139   RBC 4.42 12/01/2017 0139   RBC 4.42 12/01/2017 0139   HGB 9.7 (L) 12/01/2017 0139   HGB CANCELED 10/15/2017 0000   HCT 34.0 (L) 12/01/2017 0139   HCT CANCELED 10/15/2017 0000   PLT 310 12/01/2017 0139   PLT CANCELED 10/15/2017 0000   MCV 76.9 (L) 12/01/2017 0139   MCV 78 (L) 08/30/2017 1417   MCH 21.9 (L) 12/01/2017 0139   MCHC 28.5 (L) 12/01/2017 0139   RDW 19.6 (H) 12/01/2017 0139   RDW 17.9 (H) 08/30/2017 1417   LYMPHSABS 2.9 11/30/2017 0953   LYMPHSABS CANCELED 10/15/2017 0000   MONOABS 0.6 11/30/2017 0953   EOSABS 0.5 11/30/2017 0953   EOSABS CANCELED 10/15/2017 0000   BASOSABS 0.1 11/30/2017 0953   BASOSABS CANCELED 10/15/2017 0000    BMET    Component Value Date/Time   NA 136 12/01/2017 0139   NA 132 (L) 10/15/2017 0000   K 4.3 12/01/2017 0139   CL 106 12/01/2017 0139   CO2 22 12/01/2017 0139   GLUCOSE 156 (H) 12/01/2017 0139   BUN 22 (H) 12/01/2017 0139   BUN 17 10/15/2017 0000   CREATININE 0.97 12/01/2017 0139   CREATININE 0.85 05/10/2014 1114   CALCIUM 8.7 (L) 12/01/2017 0139   CALCIUM 9.1 10/28/2009 2118   GFRNONAA >60 12/01/2017 0139   GFRNONAA >89 02/01/2014 1518   GFRAA >60 12/01/2017 0139   GFRAA >89 02/01/2014 1518    INR    Component Value Date/Time   INR 1.17 10/08/2017 1850     Intake/Output Summary (Last 24 hours) at 12/01/2017 0807 Last data filed at 12/01/2017 0715 Gross per 24 hour  Intake 810.8 ml  Output 950 ml  Net -139.2 ml     Assessment/Plan:  57 y.o. male with non healing R  AKA likely due to repeated trauma from falls  Continue IV antibiotics; cultures pending, Abrish Erny growth gram pos cocci Tissue changes overlying lateral incision overnight; will likely need to remove lateral 3-4 staples and drain fluid pocket bedside vs OR Pain management per primary service Dr. Donnetta Hutching will evaluate the patient later today   Dagoberto Ligas, PA-C [facility] [phone] 12/01/2017 8:07 AM   I have examined the patient, reviewed and agree with above.  Very difficult management situation.  Patient has minimal support and has ongoing active drug abuse.  Has fallen on several occasions striking his stump since his discharge.  Has also presented to the emergency department on multiple occasions seeking narcotics.  Appreciate hospitalist admission of this complicated individual.  His right above-knee amputation stump does have some blistering on the lateral aspect.  Difficult to tell if he does have disruption of the fascia related to falls.  Will take to the operating room today for debridement.  Discussed with the patient explaining may require opening of a segment and potentially all of the amputation with packing or shortening of the femur.  This does not appear to be necessary from a  physical exam standpoint alone.  Needs active involvement with social work and case management.  If the patient is simply discharged as before he will be back in the emergency department in all likelihood within 24 to 48 hours.  Question if nursing home placement is an option but he has refused this in the past.  Curt Jews, MD 12/01/2017 1:41 PM

## 2017-12-01 NOTE — Transfer of Care (Signed)
Immediate Anesthesia Transfer of Care Note  Patient: [name]  Procedure(s) Performed: REVISION OF Right  ABOVE KNEE AMPUTATION (Right Leg Upper)  Patient Location: PACU  Anesthesia Type:General  Level of Consciousness: awake, alert  and oriented  Airway & Oxygen Therapy: Patient Spontanous Breathing and Patient connected to nasal cannula oxygen  Post-op Assessment: Report given to RN and Post -op Vital signs reviewed and stable  Post vital signs: Reviewed and stable  Last Vitals:  Vitals Value Taken Time  BP 120/92 12/01/2017  4:38 PM  Temp    Pulse 90 12/01/2017  4:40 PM  Resp 11 12/01/2017  4:40 PM  SpO2 100 % 12/01/2017  4:40 PM  Vitals shown include unvalidated device data.  Last Pain:  Vitals:   12/01/17 1310  TempSrc: Oral  PainSc:       Patients Stated Pain Goal: 0 (74/08/14 4818)  Complications: No apparent anesthesia complications

## 2017-12-01 NOTE — Social Work (Signed)
CSW acknowledging consult for access to medications at discharge.  For medication access please consult RN Case Management.   CSW signing off. Please consult if any additional needs arise.  Alexander Mt, [place] [facility] [phone]

## 2017-12-01 NOTE — Anesthesia Preprocedure Evaluation (Addendum)
Anesthesia Evaluation  Patient identified by MRN, date of birth, ID band Patient awake    Reviewed: Allergy & Precautions, H&P , NPO status , Patient's Chart, lab work & pertinent test results  Airway Mallampati: II  TM Distance: >3 FB Neck ROM: Full    Dental no notable dental hx. (+) Edentulous Upper, Partial Lower, Dental Advisory Given   Pulmonary asthma , sleep apnea , COPD,  COPD inhaler, Current Smoker,    Pulmonary exam normal breath sounds clear to auscultation       Cardiovascular hypertension, + Peripheral Vascular Disease and +CHF   Rhythm:Regular Rate:Normal     Neuro/Psych  Headaches, Seizures -,  Anxiety Depression    GI/Hepatic hiatal hernia, GERD  Medicated and Controlled,(+) Hepatitis -, C  Endo/Other  diabetes  Renal/GU negative Renal ROS  negative genitourinary   Musculoskeletal  (+) Arthritis , Osteoarthritis,    Abdominal   Peds  Hematology  (+) Blood dyscrasia, anemia ,   Anesthesia Other Findings   Reproductive/Obstetrics negative OB ROS                            Anesthesia Physical Anesthesia Plan  ASA: III  Anesthesia Plan: General   Post-op Pain Management:    Induction: Intravenous  PONV Risk Score and Plan: 2 and Ondansetron and Midazolam  Airway Management Planned: Oral ETT  Additional Equipment:   Intra-op Plan:   Post-operative Plan: Extubation in OR  Informed Consent: I have reviewed the patients History and Physical, chart, labs and discussed the procedure including the risks, benefits and alternatives for the proposed anesthesia with the patient or authorized representative who has indicated his/her understanding and acceptance.   Dental advisory given  Plan Discussed with: CRNA  Anesthesia Plan Comments:        Anesthesia Quick Evaluation

## 2017-12-01 NOTE — Progress Notes (Signed)
This writer walked in the patient room, room smell cigarette smoke, patient was coming from the bathroom. Ask the patient did he smoke patient denies at first and when asked again he admitted that he smoked. Educated this patient of the safety and the policy of the hospital on smoking patient agreed not to smoke, nicotine patch applied.

## 2017-12-01 NOTE — Op Note (Signed)
    OPERATIVE REPORT  DATE OF SURGERY: 12/01/2017  PATIENT: [name], 57 y.o. male MRN: [id number]  DOB: [birth year]  PRE-OPERATIVE DIAGNOSIS: Status post right above-knee amputation with wound separation lateral incision  POST-OPERATIVE DIAGNOSIS:  Same  PROCEDURE: Opening of the lateral 1 cm of right above-knee amputation incision with culture  SURGEON:  Curt Jews, M.D.  PHYSICIAN ASSISTANT: Nurse  ANESTHESIA: General  EBL: per anesthesia record  Total I/O In: 2876 [P.O.:620; I.V.:800] Out: 1300 [Urine:1300]  BLOOD ADMINISTERED: none  DRAINS: none  SPECIMEN: Aerobic and anaerobic culture  COUNTS CORRECT:  YES  PATIENT DISPOSITION:  PACU - hemodynamically stable  PROCEDURE DETAILS: Patient status post right above-knee amputation 1 month ago.  He has a very difficult duration with IV drug abuse and very poor living situation.  He has fallen on his right above-knee amputation stump on several occasions presented to the emergency department.  Was placed on antibiotics after admission.  He does have an area over the lateral aspect of his amputation with an eschar present.  Does not appear to have any subcutaneous abscess but was recommend that he go to the operating room for debridement and exploration  The staples removed from the entire amputation site.  The lateral aspect eschar was removed.  There was an area approximately 1 cm on the lateral aspect of the wound that was opened.  I sent aerobic and anaerobic cultures from this.  There was no obvious purulence.  There was no evidence of subcutaneous collection or muscle disruption in the amputation.  The wound was packed with saline and the patient was transferred to the recovery room in stable condition   [name], M.D., [facility] 12/01/2017 4:28 PM

## 2017-12-01 NOTE — Progress Notes (Signed)
Patient continue to smoke in the bathroom today, reminded of the policy. Dr. Wyline Copas at bedside educated on smoking policy as well, patient  Refuse to listen stated "I have a freedom of speech" this is [place]. Reported to security, patient gave up his cigarette and lighter.

## 2017-12-01 NOTE — Anesthesia Procedure Notes (Signed)
Procedure Name: Intubation Date/Time: 12/01/2017 4:04 PM Performed by: Alain Marion, CRNA Pre-anesthesia Checklist: Patient identified, Emergency Drugs available, Suction available and Patient being monitored Patient Re-evaluated:Patient Re-evaluated prior to induction Oxygen Delivery Method: Circle System Utilized Preoxygenation: Pre-oxygenation with 100% oxygen Induction Type: IV induction Ventilation: Mask ventilation without difficulty Laryngoscope Size: Miller and 3 Grade View: Grade I Tube type: Oral Tube size: 8.0 mm Number of attempts: 1 Airway Equipment and Method: Stylet and Oral airway Placement Confirmation: ETT inserted through vocal cords under direct vision,  positive ETCO2 and breath sounds checked- equal and bilateral Secured at: 23 cm Tube secured with: Tape Dental Injury: Teeth and Oropharynx as per pre-operative assessment

## 2017-12-02 ENCOUNTER — Encounter (HOSPITAL_COMMUNITY): Payer: Self-pay | Admitting: Vascular Surgery

## 2017-12-02 LAB — GLUCOSE, CAPILLARY
GLUCOSE-CAPILLARY: 86 mg/dL (ref 70–99)
Glucose-Capillary: 137 mg/dL — ABNORMAL HIGH (ref 70–99)
Glucose-Capillary: 260 mg/dL — ABNORMAL HIGH (ref 70–99)
Glucose-Capillary: 127 mg/dL — ABNORMAL HIGH (ref 70–99)

## 2017-12-02 LAB — AEROBIC CULTURE  (SUPERFICIAL SPECIMEN)

## 2017-12-02 LAB — AEROBIC CULTURE W GRAM STAIN (SUPERFICIAL SPECIMEN)

## 2017-12-02 MED ORDER — OXYCODONE-ACETAMINOPHEN 10-325 MG PO TABS: 1.0000 | ORAL_TABLET | ORAL | 0 refills | Status: DC | PRN

## 2017-12-02 MED ORDER — BISACODYL 5 MG PO TBEC
5.0000 mg | DELAYED_RELEASE_TABLET | Freq: Every day | ORAL | Status: DC | PRN
  Administered 2017-12-02: 5 mg via ORAL
  Filled 2017-12-02: qty 1

## 2017-12-02 MED ORDER — CEPHALEXIN 500 MG PO CAPS: 500.0000 mg | ORAL_CAPSULE | Freq: Four times a day (QID) | ORAL | 0 refills | Status: AC

## 2017-12-02 NOTE — Progress Notes (Signed)
Patient ID: [name], male   DOB: February 15, 1960, 57 y.o.   MRN: [id number] Right above-knee amputation stump dressing change.  Looks quite good today.  Some erythema at the staple line.  He does have one area that opened yesterday in the lateral aspect of his incision which appears to be clean and is not tracking.  Although he would be safe for him to be discharged tomorrow on oral antibiotics as long as he remains afebrile.  The patient reports that he fell approximately 15 times after being discharged after his above-knee amputation.  I discussed with him the possibility of skilled nursing facility.  He reports that he is staying with his brother and that he now has a bedside commode and other aids and has not fallen.  Does not want to consider nursing facility

## 2017-12-02 NOTE — Progress Notes (Signed)
Pt for discharge going home , son at the bedside, MD did the wound dressing dry and intact, health teachings given, next appointment, due med explained and understood, given pain meds, will discontinue peripheral IV line once I give the last dose of antibiotic, pt alert and oriented, wheelchair bound.

## 2017-12-02 NOTE — Progress Notes (Addendum)
Pt is s/p Opening of the lateral 1 cm of right above-knee amputation incision with culture yesterday.    E-prescribed WCHENIDP 10/325 one every 4 hours prn pain #20 (twenty) NR for acute post operative pain.  (paper rx for 5 tablets torn up).  Will need to get established in pain clinic for long term pain management.  No further narcotics will be prescribed from [facility].  F/u appointment with Dr. Donnetta Hutching in 2 weeks.  Sent message to the office and they will call with appointment.    [name], [facility] 12/02/2017 2:51 PM
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
## 2017-12-02 NOTE — Care Management Note (Addendum)
Case Management Note  Patient Details  Name: ARNALDO HEFFRON MRN: [id number] Date of Birth: 1960/06/10  Subjective/Objective:                    Action/Plan:Update received orders for home health RN , confirmed with patient he wants to continue with [facility]. Blanchard Mane with [facility] aware orders placed and patient discharging today.  Patient is active will [facility] for home health RN . Will need resumption of care orders with wound care instructions.   Blanchard Mane with [facility] aware of admission and patient's room number.   Expected Discharge Date:                  Expected Discharge Plan:     In-House Referral:     Discharge planning Services  CM Consult, Medication Assistance  Post Acute Care Choice:    Choice offered to:     DME Arranged:    DME Agency:     HH Arranged:    HH Agency:  [facility]  Status of Service:  In process, will continue to follow  If discussed at Long Length of Stay Meetings, dates discussed:    Additional Comments:  Marilu Favre, RN 12/02/2017, 10:57 AM

## 2017-12-02 NOTE — Care Management Note (Signed)
Case Management Note  Patient Details  Name: [name] MRN: [id number] Date of Birth: 1960-06-19  Subjective/Objective:                    Action/Plan:   Expected Discharge Date:  12/02/17               Expected Discharge Plan:  [place]  In-House Referral:  Financial Counselor  Discharge planning Services  CM Consult  Post Acute Care Choice:  Home Health Choice offered to:  Patient  DME Arranged:  N/A DME Agency:  NA  HH Arranged:  RN [place] Agency:  [facility]  Status of Service:  Completed, signed off  If discussed at [place] of Stay Meetings, dates discussed:    Additional Comments:  Marilu Favre, RN 12/02/2017, 1:43 PM

## 2017-12-02 NOTE — Progress Notes (Signed)
Pt educated pt on blood sugar level. Pt keep asking for snacks all night. Pt needs further education.

## 2017-12-05 LAB — ANAEROBIC CULTURE

## 2017-12-07 LAB — AEROBIC/ANAEROBIC CULTURE W GRAM STAIN (SURGICAL/DEEP WOUND)

## 2017-12-14 ENCOUNTER — Other Ambulatory Visit: Payer: Self-pay | Admitting: Physician Assistant

## 2017-12-16 ENCOUNTER — Other Ambulatory Visit: Payer: Self-pay | Admitting: Vascular Surgery

## 2017-12-21 ENCOUNTER — Encounter: Payer: Medicaid Other | Admitting: Vascular Surgery

## 2017-12-21 ENCOUNTER — Telehealth: Payer: Self-pay | Admitting: *Deleted

## 2017-12-21 NOTE — Telephone Encounter (Signed)
Patient call. Unable to get ride to today's appointment. Wanting to re-schedule and get Rx for narcotics. States his appointment with pain clinic is later in the month. Informed patient [facility] will not give any Rx for narcotics and to call pain clinic to get in sooner. [facility] will re schedule appointment with PA for post op wound check and staple removal ASAP.

## 2017-12-22 ENCOUNTER — Encounter: Payer: Self-pay | Admitting: Vascular Surgery

## 2017-12-23 ENCOUNTER — Other Ambulatory Visit: Payer: Self-pay | Admitting: Vascular Surgery

## 2017-12-24 NOTE — Progress Notes (Signed)
POST OPERATIVE OFFICE NOTE    CC:  F/u for surgery  HPI:  This is a 57 y.o. 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 Donnetta Hutching.    He presented to the hospital on 12/01/17 after falling several times on his stump since he was discharged.  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  It was then decided to take him to the operating room to explore his stump. He was on a course of Keflex for the cellulitis.     Pt has hx of substance abuse.  At discharge, he was prescribed Percocet 10/325 one every 4 hours prn pain #20 (twenty) no refill for acute post operative pain.  It was made known to the pt that he would have to get established in a pain clinic as no further narcotics would be prescribed from [facility].    He presents today for follow up.  He says his wound has healed.  He states he has phantom pain in his right foot.  He does not want to take Neurontin.  He inquires about his prosthetic leg as well as pain medication.  He states he has an appointment with pain management on 01/10/18  Allergies  Allergen Reactions  . Buprenorphine Hcl-Naloxone Hcl Shortness Of Breath and Other (See Comments)    "Felt like I was going to die," was  jittery, had trouble breathing, felt hot (reaction to Suboxone) PER THE [facility] database, the patient received #42 Suboxone films between 03/26/17 and 04/02/17 from [facility]...(??)  . Ciprofloxacin Anaphylaxis  . Morphine-Naltrexone Other (See Comments)    Seizures   . Shellfish-Derived Products Anaphylaxis, Hives and Swelling  . Benadryl [Diphenhydramine Hcl] Hives, Itching and Rash  . Fish Allergy Hives, Swelling and Rash  . Morphine And Related Other (See Comments)    Seizures  . Vancomycin Rash  . Vicodin [Hydrocodone-Acetaminophen] Nausea Only    Current Outpatient Medications    Medication Sig Dispense Refill  . albuterol (PROVENTIL HFA;VENTOLIN HFA) 108 (90 Base) MCG/ACT inhaler Inhale 2 puffs into the lungs every 4 (four) hours as needed for wheezing or shortness of breath. 1 Inhaler 1  . albuterol (PROVENTIL) (2.5 MG/3ML) 0.083% nebulizer solution Take 3 mLs (2.5 mg total) by nebulization every 6 (six) hours as needed for wheezing or shortness of breath. 75 mL 1  . atorvastatin (LIPITOR) 80 MG tablet Take 1 tablet (80 mg total) by mouth daily at 6 PM. 30 tablet 3  . Blood Glucose Monitoring Suppl (ACCU-CHEK AVIVA) device Use as instructed to check blood sugar once per day 1 each 0  . clopidogrel (PLAVIX) 75 MG tablet Take 1 tablet (75 mg total) by mouth daily. 30 tablet 3  . fluticasone (FLONASE) 50 MCG/ACT nasal spray Place 2 sprays into both nostrils daily. 1 g 1  . glucose blood (ACCU-CHEK AVIVA) test strip Use as instructed once daily to check blood sugar 100 each 3  . hydrOXYzine (VISTARIL) 25 MG capsule Take 1 capsule (25 mg total) by mouth 2 (two) times daily. Itching 30 capsule 1  . Lancets (ACCU-CHEK SOFT TOUCH) lancets Use as instructed once per day when checking blood sugar 100 each 3  . nicotine (NICODERM CQ - DOSED IN MG/24 HOURS) 21 mg/24hr patch Place 1 patch (21 mg total) onto the skin daily. (Patient not taking: Reported  on 11/30/2017) 28 patch 0  . ondansetron (ZOFRAN ODT) 4 MG disintegrating tablet Take 1 tablet (4 mg total) by mouth every 8 (eight) hours as needed for nausea or vomiting. 20 tablet 0  . oxyCODONE-acetaminophen (PERCOCET) 10-325 MG tablet TAKE 1 TABLET BY MOUTH EVERY 4 (FOUR) HOURS AS NEEDED. 20 tablet 0  . pantoprazole (PROTONIX) 20 MG tablet Take 1 tablet (20 mg total) by mouth daily before breakfast. 30 tablet 1  . pregabalin (LYRICA) 75 MG capsule TAKE 1 CAPSULE BY MOUTH THREE TIMES A DAY (Patient taking differently: Take 75 mg by mouth 3 (three) times daily. ) 90 capsule 0  . ranitidine (ZANTAC) 150 MG tablet Take 150 mg by mouth 2  (two) times daily.     No current facility-administered medications for this visit.      ROS:  See HPI  Physical Exam:  Today's Vitals   12/27/17 1359  BP: 136/78  Pulse: 90  Resp: 20  Temp: 98 F (36.7 C)  SpO2: 98%  Weight: 139 lb (63 kg)  Height: 6\' 1"  (1.854 m)  PainSc: 8    Body mass index is 18.34 kg/m.  Incision:  His right stump has healed nicely.   Assessment/Plan:  This is a 57 y.o. 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 Donnetta Hutching.  -rx given to pt for [employer] for right leg prosthesis.   -his wounds have healed nicely.  He is not given any narcotic rx today.  Discussed with him that he can take Tylenol and f/u with Pain management for narcotics in the future.   -will have him come back in 3 months with ABI's and arterial duplex LLE and see Dr. Donnetta Hutching.    [name], PA-C [facility] [phone]  Clinic MD:  none

## 2017-12-27 ENCOUNTER — Telehealth: Payer: Self-pay | Admitting: *Deleted

## 2017-12-27 ENCOUNTER — Ambulatory Visit (INDEPENDENT_AMBULATORY_CARE_PROVIDER_SITE_OTHER): Payer: Self-pay | Admitting: Physician Assistant

## 2017-12-27 ENCOUNTER — Other Ambulatory Visit: Payer: Self-pay

## 2017-12-27 ENCOUNTER — Other Ambulatory Visit: Payer: Self-pay | Admitting: Vascular Surgery

## 2017-12-27 VITALS — BP 136/78 | HR 90 | Temp 98.0°F | Resp 20 | Ht 73.0 in | Wt 139.0 lb

## 2017-12-27 DIAGNOSIS — Z89611 Acquired absence of right leg above knee: Secondary | ICD-10-CM

## 2017-12-27 NOTE — Telephone Encounter (Signed)
Patient called to demand that we give him a narcotic pain medicine Rx "to hold him over until he sees the Pain Clinic on 01-10-18". I explained to him again that our office would not be prescribing any narcotics to him; he was seen by our PA today and she told him the same thing. [name] became verbally abusive to this nurse and then he hung up.

## 2018-01-06 ENCOUNTER — Other Ambulatory Visit: Payer: Self-pay | Admitting: *Deleted

## 2018-01-14 MED FILL — PROAIR HFA 90 MCG INHALER: 108 (90 BAS | 25 days supply | Qty: 9 | Fill #0

## 2018-01-14 MED FILL — CLOPIDOGREL 75 MG TABLET: 75 | 30 days supply | Qty: 30 | Fill #1 | Status: TO

## 2018-01-18 ENCOUNTER — Ambulatory Visit: Payer: Medicaid Other | Attending: Family Medicine | Admitting: Family Medicine

## 2018-01-18 VITALS — BP 108/66 | HR 103 | Temp 98.0°F | Resp 18 | Ht 73.0 in

## 2018-01-18 DIAGNOSIS — F17210 Nicotine dependence, cigarettes, uncomplicated: Secondary | ICD-10-CM

## 2018-01-18 DIAGNOSIS — I509 Heart failure, unspecified: Secondary | ICD-10-CM | POA: Insufficient documentation

## 2018-01-18 DIAGNOSIS — E119 Type 2 diabetes mellitus without complications: Secondary | ICD-10-CM

## 2018-01-18 DIAGNOSIS — Z888 Allergy status to other drugs, medicaments and biological substances status: Secondary | ICD-10-CM | POA: Insufficient documentation

## 2018-01-18 DIAGNOSIS — Z79899 Other long term (current) drug therapy: Secondary | ICD-10-CM | POA: Diagnosis not present

## 2018-01-18 DIAGNOSIS — Z7901 Long term (current) use of anticoagulants: Secondary | ICD-10-CM | POA: Diagnosis not present

## 2018-01-18 DIAGNOSIS — Z885 Allergy status to narcotic agent status: Secondary | ICD-10-CM | POA: Diagnosis not present

## 2018-01-18 DIAGNOSIS — Z8673 Personal history of transient ischemic attack (TIA), and cerebral infarction without residual deficits: Secondary | ICD-10-CM | POA: Diagnosis not present

## 2018-01-18 DIAGNOSIS — G4730 Sleep apnea, unspecified: Secondary | ICD-10-CM | POA: Diagnosis not present

## 2018-01-18 DIAGNOSIS — Z930 Tracheostomy status: Secondary | ICD-10-CM | POA: Insufficient documentation

## 2018-01-18 DIAGNOSIS — M79672 Pain in left foot: Secondary | ICD-10-CM | POA: Diagnosis not present

## 2018-01-18 DIAGNOSIS — M79604 Pain in right leg: Secondary | ICD-10-CM

## 2018-01-18 DIAGNOSIS — I739 Peripheral vascular disease, unspecified: Secondary | ICD-10-CM

## 2018-01-18 DIAGNOSIS — Z8249 Family history of ischemic heart disease and other diseases of the circulatory system: Secondary | ICD-10-CM | POA: Diagnosis not present

## 2018-01-18 DIAGNOSIS — Z89611 Acquired absence of right leg above knee: Secondary | ICD-10-CM | POA: Diagnosis not present

## 2018-01-18 DIAGNOSIS — F329 Major depressive disorder, single episode, unspecified: Secondary | ICD-10-CM | POA: Insufficient documentation

## 2018-01-18 DIAGNOSIS — I10 Essential (primary) hypertension: Secondary | ICD-10-CM | POA: Diagnosis not present

## 2018-01-18 DIAGNOSIS — M069 Rheumatoid arthritis, unspecified: Secondary | ICD-10-CM | POA: Insufficient documentation

## 2018-01-18 DIAGNOSIS — Z95810 Presence of automatic (implantable) cardiac defibrillator: Secondary | ICD-10-CM | POA: Diagnosis not present

## 2018-01-18 DIAGNOSIS — I110 Hypertensive heart disease with heart failure: Secondary | ICD-10-CM | POA: Diagnosis not present

## 2018-01-18 DIAGNOSIS — Z881 Allergy status to other antibiotic agents status: Secondary | ICD-10-CM | POA: Diagnosis not present

## 2018-01-18 DIAGNOSIS — D509 Iron deficiency anemia, unspecified: Secondary | ICD-10-CM

## 2018-01-18 DIAGNOSIS — Z86711 Personal history of pulmonary embolism: Secondary | ICD-10-CM | POA: Diagnosis not present

## 2018-01-18 DIAGNOSIS — Z9889 Other specified postprocedural states: Secondary | ICD-10-CM | POA: Insufficient documentation

## 2018-01-18 DIAGNOSIS — G894 Chronic pain syndrome: Secondary | ICD-10-CM | POA: Diagnosis not present

## 2018-01-18 DIAGNOSIS — K219 Gastro-esophageal reflux disease without esophagitis: Secondary | ICD-10-CM | POA: Insufficient documentation

## 2018-01-18 DIAGNOSIS — E785 Hyperlipidemia, unspecified: Secondary | ICD-10-CM | POA: Insufficient documentation

## 2018-01-18 DIAGNOSIS — J441 Chronic obstructive pulmonary disease with (acute) exacerbation: Secondary | ICD-10-CM

## 2018-01-18 DIAGNOSIS — Z91013 Allergy to seafood: Secondary | ICD-10-CM | POA: Diagnosis not present

## 2018-01-18 DIAGNOSIS — Z792 Long term (current) use of antibiotics: Secondary | ICD-10-CM | POA: Insufficient documentation

## 2018-01-18 DIAGNOSIS — R569 Unspecified convulsions: Secondary | ICD-10-CM | POA: Insufficient documentation

## 2018-01-18 LAB — POCT GLYCOSYLATED HEMOGLOBIN (HGB A1C): Hemoglobin A1C: 6 % — AB (ref 4.0–5.6)

## 2018-01-18 LAB — GLUCOSE, POCT (MANUAL RESULT ENTRY): POC Glucose: 180 mg/dL — AB (ref 70–99)

## 2018-01-18 MED ORDER — DOXYCYCLINE HYCLATE 100 MG PO TABS: 100.0000 mg | ORAL_TABLET | Freq: Two times a day (BID) | ORAL | 0 refills | Status: DC

## 2018-01-18 MED ORDER — ACETAMINOPHEN-CODEINE #3 300-30 MG PO TABS: 1.0000 | ORAL_TABLET | Freq: Two times a day (BID) | ORAL | 0 refills | Status: AC

## 2018-01-18 MED FILL — DOXYCYCLINE HYCLATE 100 MG: 100 | 10 days supply | Qty: 20 | Fill #0

## 2018-01-18 MED FILL — ACETAMINOPHEN/COD #3 TABLET: 300-30 | 5 days supply | Qty: 10 | Fill #0
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
## 2018-01-19 LAB — COMPREHENSIVE METABOLIC PANEL WITH GFR
ALT: 14 IU/L (ref 0–44)
AST: 17 IU/L (ref 0–40)
Albumin/Globulin Ratio: 1.2 (ref 1.2–2.2)
Albumin: 4.1 g/dL (ref 3.5–5.5)
Alkaline Phosphatase: 95 IU/L (ref 39–117)
BUN/Creatinine Ratio: 18 (ref 9–20)
BUN: 16 mg/dL (ref 6–24)
Bilirubin Total: 0.2 mg/dL (ref 0.0–1.2)
CO2: 20 mmol/L (ref 20–29)
Calcium: 9.4 mg/dL (ref 8.7–10.2)
Chloride: 102 mmol/L (ref 96–106)
Creatinine, Ser: 0.88 mg/dL (ref 0.76–1.27)
GFR calc Af Amer: 109 mL/min/1.73
GFR calc non Af Amer: 94 mL/min/1.73
Globulin, Total: 3.3 g/dL (ref 1.5–4.5)
Glucose: 148 mg/dL — ABNORMAL HIGH (ref 65–99)
Potassium: 4.5 mmol/L (ref 3.5–5.2)
Sodium: 140 mmol/L (ref 134–144)
Total Protein: 7.4 g/dL (ref 6.0–8.5)

## 2018-01-19 LAB — CBC WITH DIFFERENTIAL/PLATELET
Basophils Absolute: 0.1 x10E3/uL (ref 0.0–0.2)
Basos: 1 %
EOS (ABSOLUTE): 0.4 x10E3/uL (ref 0.0–0.4)
Eos: 5 %
Hematocrit: 40.5 % (ref 37.5–51.0)
Hemoglobin: 13.1 g/dL (ref 13.0–17.7)
Immature Grans (Abs): 0 x10E3/uL (ref 0.0–0.1)
Immature Granulocytes: 0 %
Lymphocytes Absolute: 3.3 x10E3/uL — ABNORMAL HIGH (ref 0.7–3.1)
Lymphs: 35 %
MCH: 23.9 pg — ABNORMAL LOW (ref 26.6–33.0)
MCHC: 32.3 g/dL (ref 31.5–35.7)
MCV: 74 fL — ABNORMAL LOW (ref 79–97)
Monocytes Absolute: 0.7 x10E3/uL (ref 0.1–0.9)
Monocytes: 8 %
Neutrophils Absolute: 4.8 x10E3/uL (ref 1.4–7.0)
Neutrophils: 51 %
Platelets: 355 x10E3/uL (ref 150–450)
RBC: 5.47 x10E6/uL (ref 4.14–5.80)
RDW: 18.2 % — ABNORMAL HIGH (ref 11.6–15.4)
WBC: 9.4 x10E3/uL (ref 3.4–10.8)

## 2018-01-21 ENCOUNTER — Other Ambulatory Visit: Payer: Self-pay

## 2018-01-21 ENCOUNTER — Encounter (HOSPITAL_COMMUNITY): Payer: Self-pay | Admitting: Emergency Medicine

## 2018-01-21 ENCOUNTER — Emergency Department (HOSPITAL_COMMUNITY): Payer: Medicaid Other

## 2018-01-21 ENCOUNTER — Inpatient Hospital Stay (HOSPITAL_COMMUNITY)
Admission: EM | Admit: 2018-01-21 | Discharge: 2018-01-24 | DRG: 481 | Disposition: A | Discharge status: Home Health | Payer: Medicaid Other | Attending: Internal Medicine | Admitting: Internal Medicine

## 2018-01-21 DIAGNOSIS — Z79899 Other long term (current) drug therapy: Secondary | ICD-10-CM | POA: Diagnosis not present

## 2018-01-21 DIAGNOSIS — S72142A Displaced intertrochanteric fracture of left femur, initial encounter for closed fracture: Secondary | ICD-10-CM

## 2018-01-21 DIAGNOSIS — Z881 Allergy status to other antibiotic agents status: Secondary | ICD-10-CM | POA: Diagnosis not present

## 2018-01-21 DIAGNOSIS — S7222XA Displaced subtrochanteric fracture of left femur, initial encounter for closed fracture: Secondary | ICD-10-CM | POA: Diagnosis present

## 2018-01-21 DIAGNOSIS — B1920 Unspecified viral hepatitis C without hepatic coma: Secondary | ICD-10-CM | POA: Diagnosis present

## 2018-01-21 DIAGNOSIS — Z885 Allergy status to narcotic agent status: Secondary | ICD-10-CM | POA: Diagnosis not present

## 2018-01-21 DIAGNOSIS — J449 Chronic obstructive pulmonary disease, unspecified: Secondary | ICD-10-CM | POA: Diagnosis present

## 2018-01-21 DIAGNOSIS — Z981 Arthrodesis status: Secondary | ICD-10-CM | POA: Diagnosis not present

## 2018-01-21 DIAGNOSIS — E1151 Type 2 diabetes mellitus with diabetic peripheral angiopathy without gangrene: Secondary | ICD-10-CM | POA: Diagnosis present

## 2018-01-21 DIAGNOSIS — J42 Unspecified chronic bronchitis: Secondary | ICD-10-CM

## 2018-01-21 DIAGNOSIS — Z89611 Acquired absence of right leg above knee: Secondary | ICD-10-CM

## 2018-01-21 DIAGNOSIS — M069 Rheumatoid arthritis, unspecified: Secondary | ICD-10-CM | POA: Diagnosis present

## 2018-01-21 DIAGNOSIS — F17210 Nicotine dependence, cigarettes, uncomplicated: Secondary | ICD-10-CM | POA: Diagnosis present

## 2018-01-21 DIAGNOSIS — E119 Type 2 diabetes mellitus without complications: Secondary | ICD-10-CM | POA: Diagnosis not present

## 2018-01-21 DIAGNOSIS — I739 Peripheral vascular disease, unspecified: Secondary | ICD-10-CM | POA: Diagnosis not present

## 2018-01-21 DIAGNOSIS — S32009A Unspecified fracture of unspecified lumbar vertebra, initial encounter for closed fracture: Secondary | ICD-10-CM

## 2018-01-21 DIAGNOSIS — S7290XA Unspecified fracture of unspecified femur, initial encounter for closed fracture: Secondary | ICD-10-CM | POA: Diagnosis present

## 2018-01-21 DIAGNOSIS — S32019A Unspecified fracture of first lumbar vertebra, initial encounter for closed fracture: Secondary | ICD-10-CM | POA: Diagnosis present

## 2018-01-21 DIAGNOSIS — Y92410 Unspecified street and highway as the place of occurrence of the external cause: Secondary | ICD-10-CM

## 2018-01-21 DIAGNOSIS — D62 Acute posthemorrhagic anemia: Secondary | ICD-10-CM | POA: Diagnosis not present

## 2018-01-21 DIAGNOSIS — S7222XD Displaced subtrochanteric fracture of left femur, subsequent encounter for closed fracture with routine healing: Secondary | ICD-10-CM | POA: Diagnosis not present

## 2018-01-21 DIAGNOSIS — Z888 Allergy status to other drugs, medicaments and biological substances status: Secondary | ICD-10-CM | POA: Diagnosis not present

## 2018-01-21 DIAGNOSIS — Z7902 Long term (current) use of antithrombotics/antiplatelets: Secondary | ICD-10-CM | POA: Diagnosis not present

## 2018-01-21 DIAGNOSIS — S72332A Displaced oblique fracture of shaft of left femur, initial encounter for closed fracture: Secondary | ICD-10-CM | POA: Diagnosis not present

## 2018-01-21 DIAGNOSIS — Z91013 Allergy to seafood: Secondary | ICD-10-CM

## 2018-01-21 DIAGNOSIS — R911 Solitary pulmonary nodule: Secondary | ICD-10-CM | POA: Diagnosis present

## 2018-01-21 HISTORY — DX: Inflammatory liver disease, unspecified: K75.9

## 2018-01-21 HISTORY — DX: Type 2 diabetes mellitus without complications: E11.9

## 2018-01-21 LAB — COMPREHENSIVE METABOLIC PANEL
ALT: 19 U/L (ref 0–44)
AST: 23 U/L (ref 15–41)
Albumin: 3.8 g/dL (ref 3.5–5.0)
Alkaline Phosphatase: 73 U/L (ref 38–126)
Anion gap: 10 (ref 5–15)
BUN: 17 mg/dL (ref 6–20)
CO2: 20 mmol/L — ABNORMAL LOW (ref 22–32)
Calcium: 9.3 mg/dL (ref 8.9–10.3)
Chloride: 107 mmol/L (ref 98–111)
Creatinine, Ser: 1.08 mg/dL (ref 0.61–1.24)
GFR calc Af Amer: >60 mL/min (ref >60)
GFR calc non Af Amer: >60 mL/min (ref >60)
Glucose, Bld: 109 mg/dL — ABNORMAL HIGH (ref 70–99)
Potassium: 4.3 mmol/L (ref 3.5–5.1)
Sodium: 137 mmol/L (ref 135–145)
Total Bilirubin: 0.4 mg/dL (ref 0.3–1.2)
Total Protein: 7.8 g/dL (ref 6.5–8.1)

## 2018-01-21 LAB — I-STAT CHEM 8, ED
BUN: 20 mg/dL (ref 6–20)
CREATININE: 1 mg/dL (ref 0.61–1.24)
Calcium, Ion: 1 mmol/L — ABNORMAL LOW (ref 1.15–1.40)
Chloride: 108 mmol/L (ref 98–111)
Glucose, Bld: 113 mg/dL — ABNORMAL HIGH (ref 70–99)
HEMATOCRIT: 43 % (ref 39.0–52.0)
Hemoglobin: 14.6 g/dL (ref 13.0–17.0)
Potassium: 4.3 mmol/L (ref 3.5–5.1)
Sodium: 138 mmol/L (ref 135–145)
TCO2: 21 mmol/L — ABNORMAL LOW (ref 22–32)

## 2018-01-21 LAB — ETHANOL: Alcohol, Ethyl (B): <10 mg/dL (ref <10)

## 2018-01-21 LAB — PROTIME-INR
INR: 0.97
Prothrombin Time: 12.8 seconds (ref 11.4–15.2)

## 2018-01-21 LAB — SAMPLE TO BLOOD BANK

## 2018-01-21 LAB — CBC
HCT: 44.6 % (ref 39.0–52.0)
Hemoglobin: 13.4 g/dL (ref 13.0–17.0)
MCH: 23.8 pg — ABNORMAL LOW (ref 26.0–34.0)
MCHC: 30 g/dL (ref 30.0–36.0)
MCV: 79.1 fL — ABNORMAL LOW (ref 80.0–100.0)
Platelets: 340 10*3/uL (ref 150–400)
RBC: 5.64 MIL/uL (ref 4.22–5.81)
RDW: 18.4 % — ABNORMAL HIGH (ref 11.5–15.5)
WBC: 9.9 10*3/uL (ref 4.0–10.5)
nRBC: 0 % (ref 0.0–0.2)

## 2018-01-21 LAB — I-STAT CG4 LACTIC ACID, ED: Lactic Acid, Venous: 1.83 mmol/L (ref 0.5–1.9)

## 2018-01-21 MED ORDER — HYDROMORPHONE HCL 1 MG/ML IJ SOLN
INTRAMUSCULAR | Status: AC | PRN
  Administered 2018-01-21: 1 mg via INTRAVENOUS

## 2018-01-21 MED ORDER — METHOCARBAMOL 500 MG PO TABS
500.0000 mg | ORAL_TABLET | Freq: Four times a day (QID) | ORAL | Status: DC | PRN
  Administered 2018-01-22 – 2018-01-23 (×6): 500 mg via ORAL
  Filled 2018-01-21 (×6): qty 1

## 2018-01-21 MED ORDER — FENTANYL CITRATE (PF) 100 MCG/2ML IJ SOLN
INTRAMUSCULAR | Status: AC
  Filled 2018-01-21: qty 2

## 2018-01-21 MED ORDER — CEFAZOLIN SODIUM-DEXTROSE 2-4 GM/100ML-% IV SOLN
2.0000 g | INTRAVENOUS | Status: AC
  Administered 2018-01-22: 2 g via INTRAVENOUS
  Filled 2018-01-21: qty 100

## 2018-01-21 MED ORDER — IOHEXOL 300 MG/ML  SOLN
100.0000 mL | Freq: Once | INTRAMUSCULAR | Status: AC | PRN
  Administered 2018-01-21: 100 mL via INTRAVENOUS

## 2018-01-21 MED ORDER — CHLORHEXIDINE GLUCONATE 4 % EX LIQD
60.0000 mL | Freq: Once | CUTANEOUS | Status: AC
  Administered 2018-01-22: 4 via TOPICAL

## 2018-01-21 MED ORDER — HYDROMORPHONE HCL 1 MG/ML IJ SOLN
1.0000 mg | Freq: Once | INTRAMUSCULAR | Status: AC
  Administered 2018-01-21: 1 mg via INTRAVENOUS
  Filled 2018-01-21: qty 1

## 2018-01-21 MED ORDER — FENTANYL CITRATE (PF) 100 MCG/2ML IJ SOLN
INTRAMUSCULAR | Status: AC | PRN
  Administered 2018-01-21: 50 ug via INTRAVENOUS

## 2018-01-21 MED ORDER — INSULIN ASPART 100 UNIT/ML ~~LOC~~ SOLN
0.0000 [IU] | SUBCUTANEOUS | Status: DC
  Administered 2018-01-22 (×2): 2 [IU] via SUBCUTANEOUS
  Administered 2018-01-22: 3 [IU] via SUBCUTANEOUS
  Administered 2018-01-22: 7 [IU] via SUBCUTANEOUS
  Administered 2018-01-22 – 2018-01-23 (×2): 1 [IU] via SUBCUTANEOUS
  Administered 2018-01-23 (×5): 2 [IU] via SUBCUTANEOUS

## 2018-01-21 MED ORDER — HYDROCODONE-ACETAMINOPHEN 5-325 MG PO TABS
1.0000 | ORAL_TABLET | Freq: Four times a day (QID) | ORAL | Status: DC | PRN
  Administered 2018-01-22: 1 via ORAL
  Filled 2018-01-21: qty 1

## 2018-01-21 MED ORDER — HYDROMORPHONE HCL 1 MG/ML IJ SOLN
1.0000 mg | INTRAMUSCULAR | Status: DC | PRN
  Administered 2018-01-21 – 2018-01-22 (×2): 1 mg via INTRAVENOUS
  Filled 2018-01-21 (×2): qty 1

## 2018-01-21 MED ORDER — METHOCARBAMOL 1000 MG/10ML IJ SOLN
500.0000 mg | Freq: Four times a day (QID) | INTRAVENOUS | Status: DC | PRN
  Filled 2018-01-21: qty 5

## 2018-01-21 MED ORDER — ALBUTEROL SULFATE (2.5 MG/3ML) 0.083% IN NEBU: 2.5000 mg | INHALATION_SOLUTION | RESPIRATORY_TRACT | Status: DC | PRN

## 2018-01-21 MED ORDER — HYDROMORPHONE HCL 1 MG/ML IJ SOLN
INTRAMUSCULAR | Status: AC
  Filled 2018-01-21: qty 1

## 2018-01-21 MED ORDER — FENTANYL CITRATE (PF) 100 MCG/2ML IJ SOLN
50.0000 ug | Freq: Once | INTRAMUSCULAR | Status: AC
  Administered 2018-01-21: 50 ug via INTRAVENOUS

## 2018-01-21 MED ORDER — POVIDONE-IODINE 10 % EX SWAB
2.0000 "application " | Freq: Once | CUTANEOUS | Status: AC
  Administered 2018-01-22: 2 via TOPICAL
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
## 2018-01-21 NOTE — ED Notes (Signed)
RN with patient to CT.
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
## 2018-01-21 NOTE — Progress Notes (Signed)
Patient arrived on unit from emergency room with cervical off,refuses to wear collar. I explain the risk of possible damage to spine by not wearing it,he still refuses to allow cllow to be placed.
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
## 2018-01-22 ENCOUNTER — Inpatient Hospital Stay (HOSPITAL_COMMUNITY): Payer: Medicaid Other | Admitting: Anesthesiology

## 2018-01-22 ENCOUNTER — Encounter (HOSPITAL_COMMUNITY): Payer: Self-pay | Admitting: Anesthesiology

## 2018-01-22 ENCOUNTER — Encounter: Payer: Self-pay | Admitting: Family Medicine

## 2018-01-22 ENCOUNTER — Inpatient Hospital Stay (HOSPITAL_COMMUNITY): Payer: Medicaid Other

## 2018-01-22 ENCOUNTER — Encounter (HOSPITAL_COMMUNITY)
Admission: EM | Disposition: A | Discharge status: Home Health | Payer: Self-pay | Source: Home / Self Care | Attending: Family Medicine

## 2018-01-22 DIAGNOSIS — S7222XD Displaced subtrochanteric fracture of left femur, subsequent encounter for closed fracture with routine healing: Secondary | ICD-10-CM

## 2018-01-22 DIAGNOSIS — S72332A Displaced oblique fracture of shaft of left femur, initial encounter for closed fracture: Secondary | ICD-10-CM

## 2018-01-22 HISTORY — PX: INTRAMEDULLARY (IM) NAIL INTERTROCHANTERIC: SHX5875

## 2018-01-22 LAB — URINALYSIS, ROUTINE W REFLEX MICROSCOPIC
Bacteria, UA: NONE SEEN
Bilirubin Urine: NEGATIVE
GLUCOSE, UA: NEGATIVE mg/dL
Hgb urine dipstick: NEGATIVE
Ketones, ur: NEGATIVE mg/dL
Leukocytes, UA: NEGATIVE
NITRITE: NEGATIVE
Protein, ur: 100 mg/dL — AB
Specific Gravity, Urine: >1.046 — ABNORMAL HIGH (ref 1.005–1.030)
pH: 5 (ref 5.0–8.0)

## 2018-01-22 LAB — GLUCOSE, CAPILLARY
GLUCOSE-CAPILLARY: 176 mg/dL — AB (ref 70–99)
Glucose-Capillary: 174 mg/dL — ABNORMAL HIGH (ref 70–99)
Glucose-Capillary: 128 mg/dL — ABNORMAL HIGH (ref 70–99)
Glucose-Capillary: 188 mg/dL — ABNORMAL HIGH (ref 70–99)
Glucose-Capillary: 345 mg/dL — ABNORMAL HIGH (ref 70–99)
Glucose-Capillary: 204 mg/dL — ABNORMAL HIGH (ref 70–99)

## 2018-01-22 LAB — RAPID URINE DRUG SCREEN, HOSP PERFORMED
AMPHETAMINES: NOT DETECTED
BARBITURATES: NOT DETECTED
Benzodiazepines: NOT DETECTED
Cocaine: POSITIVE — AB
Opiates: POSITIVE — AB
Tetrahydrocannabinol: NOT DETECTED

## 2018-01-22 LAB — HIV ANTIBODY (ROUTINE TESTING W REFLEX): HIV Screen 4th Generation wRfx: NONREACTIVE

## 2018-01-22 LAB — MRSA PCR SCREENING: MRSA by PCR: NEGATIVE

## 2018-01-22 SURGERY — FIXATION, FRACTURE, INTERTROCHANTERIC, WITH INTRAMEDULLARY ROD
Anesthesia: General | Site: Hip | Laterality: Left

## 2018-01-22 MED ORDER — ACETAMINOPHEN 10 MG/ML IV SOLN
INTRAVENOUS | Status: AC
  Filled 2018-01-22: qty 100

## 2018-01-22 MED ORDER — PHENYLEPHRINE 40 MCG/ML (10ML) SYRINGE FOR IV PUSH (FOR BLOOD PRESSURE SUPPORT)
PREFILLED_SYRINGE | INTRAVENOUS | Status: AC
  Filled 2018-01-22: qty 10

## 2018-01-22 MED ORDER — ONDANSETRON HCL 4 MG/2ML IJ SOLN
INTRAMUSCULAR | Status: DC | PRN
  Administered 2018-01-22: 4 mg via INTRAVENOUS

## 2018-01-22 MED ORDER — LIDOCAINE 2% (20 MG/ML) 5 ML SYRINGE
INTRAMUSCULAR | Status: AC
  Filled 2018-01-22: qty 5

## 2018-01-22 MED ORDER — METOCLOPRAMIDE HCL 5 MG PO TABS: 5.0000 mg | ORAL_TABLET | Freq: Three times a day (TID) | ORAL | Status: DC | PRN

## 2018-01-22 MED ORDER — ONDANSETRON HCL 4 MG/2ML IJ SOLN
4.0000 mg | Freq: Four times a day (QID) | INTRAMUSCULAR | Status: DC | PRN
  Administered 2018-01-23 – 2018-01-24 (×2): 4 mg via INTRAVENOUS
  Filled 2018-01-22 (×2): qty 2

## 2018-01-22 MED ORDER — EPHEDRINE 5 MG/ML INJ
INTRAVENOUS | Status: AC
  Filled 2018-01-22: qty 10

## 2018-01-22 MED ORDER — FENTANYL CITRATE (PF) 250 MCG/5ML IJ SOLN
INTRAMUSCULAR | Status: AC
  Filled 2018-01-22: qty 5

## 2018-01-22 MED ORDER — ALBUTEROL SULFATE (2.5 MG/3ML) 0.083% IN NEBU
2.5000 mg | INHALATION_SOLUTION | Freq: Four times a day (QID) | RESPIRATORY_TRACT | Status: DC
  Administered 2018-01-22 (×2): 2.5 mg via RESPIRATORY_TRACT
  Filled 2018-01-22 (×2): qty 3

## 2018-01-22 MED ORDER — MORPHINE SULFATE (PF) 2 MG/ML IV SOLN: 1.0000 mg | INTRAVENOUS | Status: DC | PRN

## 2018-01-22 MED ORDER — NICOTINE 21 MG/24HR TD PT24
21.0000 mg | MEDICATED_PATCH | Freq: Every day | TRANSDERMAL | Status: DC
  Administered 2018-01-22 – 2018-01-24 (×3): 21 mg via TRANSDERMAL
  Filled 2018-01-22 (×3): qty 1

## 2018-01-22 MED ORDER — HYDROMORPHONE HCL 1 MG/ML IJ SOLN
0.5000 mg | INTRAMUSCULAR | Status: DC | PRN
  Administered 2018-01-22 – 2018-01-24 (×9): 1 mg via INTRAVENOUS
  Filled 2018-01-22 (×10): qty 1

## 2018-01-22 MED ORDER — OXYCODONE HCL 5 MG PO TABS
10.0000 mg | ORAL_TABLET | ORAL | Status: DC | PRN
  Administered 2018-01-22 – 2018-01-24 (×9): 15 mg via ORAL
  Filled 2018-01-22 (×9): qty 3

## 2018-01-22 MED ORDER — OXYCODONE HCL 5 MG PO TABS: 5.0000 mg | ORAL_TABLET | Freq: Once | ORAL | Status: DC | PRN

## 2018-01-22 MED ORDER — ENOXAPARIN SODIUM 40 MG/0.4ML ~~LOC~~ SOLN
40.0000 mg | SUBCUTANEOUS | Status: DC
  Administered 2018-01-23 – 2018-01-24 (×2): 40 mg via SUBCUTANEOUS
  Filled 2018-01-22 (×2): qty 0.4

## 2018-01-22 MED ORDER — DEXAMETHASONE SODIUM PHOSPHATE 10 MG/ML IJ SOLN
INTRAMUSCULAR | Status: AC
  Filled 2018-01-22: qty 1

## 2018-01-22 MED ORDER — MIDAZOLAM HCL 2 MG/2ML IJ SOLN
INTRAMUSCULAR | Status: AC
  Filled 2018-01-22: qty 2

## 2018-01-22 MED ORDER — ALBUMIN HUMAN 5 % IV SOLN
INTRAVENOUS | Status: DC | PRN
  Administered 2018-01-22: 10:00:00 via INTRAVENOUS

## 2018-01-22 MED ORDER — ACETAMINOPHEN 160 MG/5ML PO SOLN: 1000.0000 mg | Freq: Once | ORAL | Status: DC | PRN

## 2018-01-22 MED ORDER — ALBUTEROL SULFATE (2.5 MG/3ML) 0.083% IN NEBU
2.5000 mg | INHALATION_SOLUTION | Freq: Two times a day (BID) | RESPIRATORY_TRACT | Status: DC
  Administered 2018-01-22 – 2018-01-24 (×4): 2.5 mg via RESPIRATORY_TRACT
  Filled 2018-01-22 (×4): qty 3

## 2018-01-22 MED ORDER — PHENYLEPHRINE HCL 10 MG/ML IJ SOLN
INTRAMUSCULAR | Status: DC | PRN
  Administered 2018-01-22 (×5): 80 ug via INTRAVENOUS

## 2018-01-22 MED ORDER — 0.9 % SODIUM CHLORIDE (POUR BTL) OPTIME
TOPICAL | Status: DC | PRN
  Administered 2018-01-22: 1000 mL

## 2018-01-22 MED ORDER — FENTANYL CITRATE (PF) 100 MCG/2ML IJ SOLN: 25.0000 ug | INTRAMUSCULAR | Status: DC | PRN

## 2018-01-22 MED ORDER — ONDANSETRON HCL 4 MG PO TABS
4.0000 mg | ORAL_TABLET | Freq: Four times a day (QID) | ORAL | Status: DC | PRN
  Administered 2018-01-23 (×2): 4 mg via ORAL
  Filled 2018-01-22 (×2): qty 1

## 2018-01-22 MED ORDER — SODIUM CHLORIDE 0.9 % IV SOLN
INTRAVENOUS | Status: DC | PRN
  Administered 2018-01-22: 25 ug/min via INTRAVENOUS

## 2018-01-22 MED ORDER — FENTANYL CITRATE (PF) 100 MCG/2ML IJ SOLN
INTRAMUSCULAR | Status: DC | PRN
  Administered 2018-01-22 (×2): 50 ug via INTRAVENOUS
  Administered 2018-01-22: 100 ug via INTRAVENOUS
  Administered 2018-01-22: 50 ug via INTRAVENOUS

## 2018-01-22 MED ORDER — BUPIVACAINE-EPINEPHRINE (PF) 0.25% -1:200000 IJ SOLN
INTRAMUSCULAR | Status: AC
  Filled 2018-01-22: qty 30

## 2018-01-22 MED ORDER — OXYCODONE HCL 5 MG PO TABS
5.0000 mg | ORAL_TABLET | ORAL | Status: DC | PRN
  Administered 2018-01-22 – 2018-01-24 (×3): 10 mg via ORAL
  Filled 2018-01-22 (×3): qty 2

## 2018-01-22 MED ORDER — OXYCODONE HCL 5 MG/5ML PO SOLN: 5.0000 mg | Freq: Once | ORAL | Status: DC | PRN

## 2018-01-22 MED ORDER — SODIUM CHLORIDE 0.45 % IV SOLN
INTRAVENOUS | Status: DC
  Administered 2018-01-22: 13:00:00 via INTRAVENOUS

## 2018-01-22 MED ORDER — DEXAMETHASONE SODIUM PHOSPHATE 10 MG/ML IJ SOLN
INTRAMUSCULAR | Status: DC | PRN
  Administered 2018-01-22: 10 mg via INTRAVENOUS

## 2018-01-22 MED ORDER — LACTATED RINGERS IV SOLN
INTRAVENOUS | Status: DC | PRN
  Administered 2018-01-22: 09:00:00 via INTRAVENOUS

## 2018-01-22 MED ORDER — DOCUSATE SODIUM 100 MG PO CAPS
100.0000 mg | ORAL_CAPSULE | Freq: Two times a day (BID) | ORAL | Status: DC
  Administered 2018-01-22 – 2018-01-24 (×5): 100 mg via ORAL
  Filled 2018-01-22 (×5): qty 1

## 2018-01-22 MED ORDER — ACETAMINOPHEN 325 MG PO TABS: 325.0000 mg | ORAL_TABLET | Freq: Four times a day (QID) | ORAL | Status: DC | PRN

## 2018-01-22 MED ORDER — EPHEDRINE SULFATE 50 MG/ML IJ SOLN: INTRAMUSCULAR | Status: DC | PRN

## 2018-01-22 MED ORDER — ONDANSETRON HCL 4 MG/2ML IJ SOLN
INTRAMUSCULAR | Status: AC
  Filled 2018-01-22: qty 2

## 2018-01-22 MED ORDER — ACETAMINOPHEN 10 MG/ML IV SOLN: 1000.0000 mg | Freq: Once | INTRAVENOUS | Status: DC | PRN

## 2018-01-22 MED ORDER — MIDAZOLAM HCL 5 MG/5ML IJ SOLN
INTRAMUSCULAR | Status: DC | PRN
  Administered 2018-01-22: 2 mg via INTRAVENOUS

## 2018-01-22 MED ORDER — ROCURONIUM BROMIDE 50 MG/5ML IV SOSY
PREFILLED_SYRINGE | INTRAVENOUS | Status: AC
  Filled 2018-01-22: qty 5

## 2018-01-22 MED ORDER — FENTANYL CITRATE (PF) 100 MCG/2ML IJ SOLN
INTRAMUSCULAR | Status: AC
  Filled 2018-01-22: qty 2

## 2018-01-22 MED ORDER — ROCURONIUM BROMIDE 50 MG/5ML IV SOSY
PREFILLED_SYRINGE | INTRAVENOUS | Status: DC | PRN
  Administered 2018-01-22: 30 mg via INTRAVENOUS

## 2018-01-22 MED ORDER — SUCCINYLCHOLINE CHLORIDE 20 MG/ML IJ SOLN
INTRAMUSCULAR | Status: DC | PRN
  Administered 2018-01-22: 80 mg via INTRAVENOUS

## 2018-01-22 MED ORDER — PROPOFOL 10 MG/ML IV BOLUS
INTRAVENOUS | Status: DC | PRN
  Administered 2018-01-22: 30 mg via INTRAVENOUS
  Administered 2018-01-22: 40 mg via INTRAVENOUS
  Administered 2018-01-22 (×2): 25 mg via INTRAVENOUS
  Administered 2018-01-22: 110 mg via INTRAVENOUS
  Administered 2018-01-22: 25 mg via INTRAVENOUS
  Administered 2018-01-22: 50 mg via INTRAVENOUS
  Administered 2018-01-22: 25 mg via INTRAVENOUS

## 2018-01-22 MED ORDER — CELECOXIB 200 MG PO CAPS
200.0000 mg | ORAL_CAPSULE | Freq: Two times a day (BID) | ORAL | Status: DC
  Administered 2018-01-22 – 2018-01-24 (×5): 200 mg via ORAL
  Filled 2018-01-22 (×5): qty 1

## 2018-01-22 MED ORDER — HYDROMORPHONE HCL 1 MG/ML IJ SOLN
0.2500 mg | INTRAMUSCULAR | Status: DC | PRN
  Administered 2018-01-22 – 2018-01-23 (×3): 0.25 mg via INTRAVENOUS
  Filled 2018-01-22 (×2): qty 1

## 2018-01-22 MED ORDER — ACETAMINOPHEN 500 MG PO TABS: 1000.0000 mg | ORAL_TABLET | Freq: Once | ORAL | Status: DC | PRN

## 2018-01-22 MED ORDER — PROPOFOL 10 MG/ML IV BOLUS
INTRAVENOUS | Status: AC
  Filled 2018-01-22: qty 40

## 2018-01-22 MED ORDER — SUGAMMADEX SODIUM 200 MG/2ML IV SOLN
INTRAVENOUS | Status: DC | PRN
  Administered 2018-01-22: 133.4 mg via INTRAVENOUS

## 2018-01-22 MED ORDER — LIDOCAINE 2% (20 MG/ML) 5 ML SYRINGE
INTRAMUSCULAR | Status: DC | PRN
  Administered 2018-01-22: 60 mg via INTRAVENOUS

## 2018-01-22 MED ORDER — BUPIVACAINE-EPINEPHRINE (PF) 0.25% -1:200000 IJ SOLN
INTRAMUSCULAR | Status: DC | PRN
  Administered 2018-01-22: 10 mL

## 2018-01-22 MED ORDER — LIDOCAINE 2% (20 MG/ML) 5 ML SYRINGE: INTRAMUSCULAR | Status: DC | PRN

## 2018-01-22 MED ORDER — METOCLOPRAMIDE HCL 5 MG/ML IJ SOLN: 5.0000 mg | Freq: Three times a day (TID) | INTRAMUSCULAR | Status: DC | PRN

## 2018-01-22 MED ORDER — BUDESONIDE 0.25 MG/2ML IN SUSP
0.2500 mg | Freq: Two times a day (BID) | RESPIRATORY_TRACT | Status: DC
  Administered 2018-01-22 – 2018-01-24 (×5): 0.25 mg via RESPIRATORY_TRACT
  Filled 2018-01-22 (×5): qty 2

## 2018-01-22 SURGICAL SUPPLY — 47 items
BIT DRILL 4.3MMS DISTAL GRDTED (BIT) IMPLANT
BNDG COHESIVE 4X5 TAN STRL (GAUZE/BANDAGES/DRESSINGS) ×3 IMPLANT
CANISTER SUCT 3000ML PPV (MISCELLANEOUS) ×3 IMPLANT
COVER MAYO STAND STRL (DRAPES) ×3 IMPLANT
COVER PERINEAL POST (MISCELLANEOUS) ×3 IMPLANT
COVER SURGICAL LIGHT HANDLE (MISCELLANEOUS) ×3 IMPLANT
COVER WAND RF STERILE (DRAPES) ×3 IMPLANT
DRAPE C-ARM 42X72 X-RAY (DRAPES) ×3 IMPLANT
DRAPE STERI IOBAN 125X83 (DRAPES) ×3 IMPLANT
DRILL 4.3MMS DISTAL GRADUATED (BIT) ×3
DRSG PAD ABDOMINAL 8X10 ST (GAUZE/BANDAGES/DRESSINGS) ×1 IMPLANT
DURAPREP 26ML APPLICATOR (WOUND CARE) ×3 IMPLANT
ELECT REM PT RETURN 9FT ADLT (ELECTROSURGICAL) ×3
ELECTRODE REM PT RTRN 9FT ADLT (ELECTROSURGICAL) ×1 IMPLANT
EVACUATOR 1/8 PVC DRAIN (DRAIN) IMPLANT
GAUZE SPONGE 4X4 12PLY STRL (GAUZE/BANDAGES/DRESSINGS) ×1 IMPLANT
GAUZE SPONGE 4X4 12PLY STRL LF (GAUZE/BANDAGES/DRESSINGS) ×2 IMPLANT
GAUZE XEROFORM 1X8 LF (GAUZE/BANDAGES/DRESSINGS) ×3 IMPLANT
GLOVE BIOGEL PI IND STRL 8 (GLOVE) ×2 IMPLANT
GLOVE BIOGEL PI INDICATOR 8 (GLOVE) ×4
GLOVE ORTHO TXT STRL SZ7.5 (GLOVE) ×6 IMPLANT
GOWN STRL REUS W/ TWL LRG LVL3 (GOWN DISPOSABLE) ×1 IMPLANT
GOWN STRL REUS W/TWL 2XL LVL3 (GOWN DISPOSABLE) ×1 IMPLANT
GOWN STRL REUS W/TWL LRG LVL3 (GOWN DISPOSABLE) ×6 IMPLANT
GUIDEWIRE BALL NOSE 100CM (WIRE) ×2 IMPLANT
HFN LH 130 DEG 11MM X 380MM (Orthopedic Implant) ×2 IMPLANT
HIP FRAC NAIL LAG SCR 10.5X100 (Orthopedic Implant) ×2 IMPLANT
KIT BASIN OR (CUSTOM PROCEDURE TRAY) ×3 IMPLANT
KIT TURNOVER KIT B (KITS) ×3 IMPLANT
LINER BOOT UNIVERSAL DISP (MISCELLANEOUS) ×1 IMPLANT
MANIFOLD NEPTUNE II (INSTRUMENTS) ×1 IMPLANT
NDL HYPO 25GX1X1/2 BEV (NEEDLE) IMPLANT
NEEDLE HYPO 25GX1X1/2 BEV (NEEDLE) ×3 IMPLANT
NS IRRIG 1000ML POUR BTL (IV SOLUTION) ×3 IMPLANT
PACK GENERAL/GYN (CUSTOM PROCEDURE TRAY) ×3 IMPLANT
PAD ABD 8X10 STRL (GAUZE/BANDAGES/DRESSINGS) ×2 IMPLANT
PAD ARMBOARD 7.5X6 YLW CONV (MISCELLANEOUS) ×8 IMPLANT
SCREW BONE CORTICAL 5.0X44 (Screw) ×2 IMPLANT
SCREW CANN THRD AFF 10.5X100 (Orthopedic Implant) IMPLANT
STAPLER VISISTAT 35W (STAPLE) ×3 IMPLANT
SUT VIC AB 0 CT1 27 (SUTURE) ×3
SUT VIC AB 0 CT1 27XBRD ANBCTR (SUTURE) ×1 IMPLANT
SUT VIC AB 2-0 CT1 27 (SUTURE) ×6
SUT VIC AB 2-0 CT1 TAPERPNT 27 (SUTURE) ×2 IMPLANT
SYR CONTROL 10ML LL (SYRINGE) ×2 IMPLANT
TAPE CLOTH SURG 6X10 WHT LF (GAUZE/BANDAGES/DRESSINGS) ×2 IMPLANT
WATER STERILE IRR 1000ML POUR (IV SOLUTION) ×3 IMPLANT

## 2018-01-22 NOTE — Transfer of Care (Signed)
Immediate Anesthesia Transfer of Care Note  Patient: [name]  Procedure(s) Performed: INTERTROCHANTRIC INTRAMEDULLARY NAIL FOR LEFT SUBTROCHANTRIC FRACTURE (Left Hip)  Patient Location: PACU  Anesthesia Type:General  Level of Consciousness: awake, alert , oriented and sedated  Airway & Oxygen Therapy: Patient Spontanous Breathing and Patient connected to nasal cannula oxygen  Post-op Assessment: Report given to RN, Post -op Vital signs reviewed and stable and Patient moving all extremities  Post vital signs: Reviewed and stable  Last Vitals:  Vitals Value Taken Time  BP 152/77 01/22/2018 11:22 AM  Temp    Pulse 111 01/22/2018 11:22 AM  Resp 15 01/22/2018 11:22 AM  SpO2 94 % 01/22/2018 11:22 AM  Vitals shown include unvalidated device data.  Last Pain:  Vitals:   01/21/18 2302  TempSrc: Oral  PainSc:          Complications: No apparent anesthesia complications

## 2018-01-22 NOTE — Op Note (Signed)
Preop diagnosis: Left subtrochanteric femoral shaft fracture, closed.  Postop diagnosis: Same  Procedure: Affixes left femoral nail 380 x 11 mm with 100 mm lag screw 44 mm distal interlock screw for left subtrochanteric femoral shaft fracture.  Surgeon: Rodell Perna, MD  Anesthesia: General plus Marcaine skin General 10 cc  EBL: 200 cc  Procedure: After induction general anesthesia Hana boot was placed in the left foot.  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  Reduction was performed with internal rotation traction C arm was brought in after adequate length and alignment was obtained prepping with DuraPrep there is squared with towels Betadine shower curtain Steri-Drape applied timeout procedure completed.  Incision was made proximal to trochanter gluteus medius fascia was split multiple bleeders were coagulated with the cautery.  Wheat Lander tractor was placed tip of the trochanter was palpated rod was placed checked under C arm overreamed and then the beaded tip was used using the long finger guide to try to advance across the fracture site.  With the sub-troches fracture there is some angulation and incision was extended distally and the femur was clamped with a self-retaining clamp pulling the distal fragment more medial improving alignment clamping and down in the right is able be passed across the fracture site down to the knee measured 400 380 was selected.  11 x 380 affixes nail was inserted which ended up at the superior pole of the patella.  Proximal interlock screw was placed up the neck into the center of the head on AP and lateral measured placed on 100 mm and locked down securely.  Distal interlock was 44 mm used with the freehand technique with perfect circle.  Area was irrigated final spot  pictures were taken lateral jig was removed from the nail.  #1 Vicryl used for closure of the tensor fascia proximally.  2-0 Vicryl subtenons tissue skin staple closure Marcaine infiltration postop dressing and transferred recovery room.

## 2018-01-22 NOTE — Progress Notes (Signed)
RN verified the presence of a signed informed consent that matches stated procedure by patient. Verified armband matches patient's stated name and birth date. Verified NPO status and that all jewelry, contact, glasses, dentures, and partials had been removed (if applicable).  

## 2018-01-22 NOTE — Plan of Care (Signed)
Problem: Education: Goal: Knowledge of General Education information will improve Description Including pain rating scale, medication(s)/side effects and non-pharmacologic comfort measures Outcome: Progressing   Problem: Health Behavior/Discharge Planning: Goal: Ability to manage health-related needs will improve Outcome: Progressing   Problem: Clinical Measurements: Goal: Respiratory complications will improve Outcome: Progressing   Problem: Coping: Goal: Level of anxiety will decrease Outcome: Progressing   Problem: Elimination: Goal: Will not experience complications related to urinary retention Outcome: Progressing   Problem: Pain Managment: Goal: General experience of comfort will improve Outcome: Progressing   Problem: Safety: Goal: Ability to remain free from injury will improve Outcome: Progressing   Problem: Skin Integrity: Goal: Risk for impaired skin integrity will decrease Outcome: Progressing   

## 2018-01-22 NOTE — Progress Notes (Signed)
Patient seen by attending,informed doctor that patient's left foot purplish in color,nopalpable pulses in foot.Doctor assess left foot use doppler to locate left pedal pulse states foot is okay no signs of compartmental syndrome.
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
## 2018-01-22 NOTE — Anesthesia Preprocedure Evaluation (Addendum)
Anesthesia Evaluation    History of Anesthesia Complications Negative for: history of anesthetic complications  Airway Mallampati: II  TM Distance: >3 FB Neck ROM: Full    Dental  (+) Dental Advisory Given   Pulmonary asthma , sleep apnea , COPD, Current Smoker,    breath sounds clear to auscultation       Cardiovascular hypertension, + Peripheral Vascular Disease and +CHF   Rhythm:Regular     Neuro/Psych  Headaches, Seizures -,  PSYCHIATRIC DISORDERS Anxiety Depression  Neuromuscular disease CVA    GI/Hepatic hiatal hernia, GERD  ,(+) Hepatitis -  Endo/Other  diabetes  Renal/GU Renal disease     Musculoskeletal  (+) Arthritis ,   Abdominal   Peds  Hematology   Anesthesia Other Findings   Reproductive/Obstetrics                           Anesthesia Physical Anesthesia Plan  ASA: III  Anesthesia Plan: General   Post-op Pain Management:    Induction: Intravenous  PONV Risk Score and Plan: 1 and Ondansetron and Treatment may vary due to age or medical condition  Airway Management Planned: Oral ETT  Additional Equipment: None  Intra-op Plan:   Post-operative Plan: Extubation in OR  Informed Consent: I have reviewed the patients History and Physical, chart, labs and discussed the procedure including the risks, benefits and alternatives for the proposed anesthesia with the patient or authorized representative who has indicated his/her understanding and acceptance.     Dental advisory given  Plan Discussed with: CRNA and Surgeon  Anesthesia Plan Comments:         Anesthesia Quick Evaluation                                  Anesthesia Evaluation  Patient identified by MRN, date of birth, ID band Patient awake    Reviewed: Allergy & Precautions, H&P , NPO status , Patient's Chart, lab work & pertinent test results  Airway Mallampati: II  TM Distance: >3 FB Neck ROM:  Full    Dental no notable dental hx. (+) Edentulous Upper, Partial Lower, Dental Advisory Given   Pulmonary asthma , sleep apnea , COPD,  COPD inhaler, Current Smoker,    Pulmonary exam normal breath sounds clear to auscultation       Cardiovascular hypertension, + Peripheral Vascular Disease and +CHF   Rhythm:Regular Rate:Normal     Neuro/Psych  Headaches, Seizures -,  Anxiety Depression    GI/Hepatic hiatal hernia, GERD  Medicated and Controlled,(+) Hepatitis -, C  Endo/Other  diabetes  Renal/GU negative Renal ROS  negative genitourinary   Musculoskeletal  (+) Arthritis , Osteoarthritis,    Abdominal   Peds  Hematology  (+) Blood dyscrasia, anemia ,   Anesthesia Other Findings   Reproductive/Obstetrics negative OB ROS                            Anesthesia Physical Anesthesia Plan  ASA: III  Anesthesia Plan: General   Post-op Pain Management:    Induction: Intravenous  PONV Risk Score and Plan: 2 and Ondansetron and Midazolam  Airway Management Planned: Oral ETT  Additional Equipment:   Intra-op Plan:   Post-operative Plan: Extubation in OR  Informed Consent: I have reviewed the patients History and Physical, chart, labs and discussed the procedure including the risks, benefits  and alternatives for the proposed anesthesia with the patient or authorized representative who has indicated his/her understanding and acceptance.   Dental advisory given  Plan Discussed with: CRNA  Anesthesia Plan Comments:        Anesthesia Quick Evaluation

## 2018-01-22 NOTE — Progress Notes (Signed)
Pt returned to room 5N19 via bed after surgery. Received report from [place], [place] in PACU. See reassessment. Will continue to monitor.

## 2018-01-22 NOTE — Progress Notes (Signed)
Patient will not allow for complete skin assessment due to pain issues.I will notify dayshift to attempt assessment after surgery in the morning

## 2018-01-22 NOTE — Progress Notes (Signed)
Pre-op report called and given to Robby, RN in [employer]. All questions answered to satisfaction. [facility] traction remains in place. OR personnel transported pt off unit.
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
## 2018-01-22 NOTE — Interval H&P Note (Signed)
History and Physical Interval Note:  01/22/2018 8:41 AM  [name]  has presented today for surgery, with the diagnosis of LEFT SUBTROCH FRACTURE  The various methods of treatment have been discussed with the patient and family. After consideration of risks, benefits and other options for treatment, the patient has consented to  Procedure(s): LEFT BIOMET AFFIXUS LONG  NAIL SUBTROCHANTRIC FRACTURE (Left) as a surgical intervention .  The patient's history has been reviewed, patient examined, no change in status, stable for surgery.  I have reviewed the patient's chart and labs.  Questions were answered to the patient's satisfaction.     [name]

## 2018-01-22 NOTE — Anesthesia Procedure Notes (Signed)
Procedure Name: Intubation Date/Time: 01/22/2018 9:30 AM Performed by: Scheryl Darter, CRNA Pre-anesthesia Checklist: Patient identified, Emergency Drugs available, Suction available and Patient being monitored Patient Re-evaluated:Patient Re-evaluated prior to induction Oxygen Delivery Method: Circle System Utilized Preoxygenation: Pre-oxygenation with 100% oxygen Induction Type: IV induction Ventilation: Mask ventilation without difficulty Laryngoscope Size: Mac and 3 Tube type: Oral Tube size: 7.5 mm Number of attempts: 1 Airway Equipment and Method: Stylet and Oral airway Placement Confirmation: ETT inserted through vocal cords under direct vision,  positive ETCO2 and breath sounds checked- equal and bilateral Secured at: 23 cm Tube secured with: Tape Dental Injury: Teeth and Oropharynx as per pre-operative assessment

## 2018-01-22 NOTE — Progress Notes (Signed)
Post op sleeping. Wakes up to tell me he is in pain but not too bad. NVI. Dressing dry.  Will be NWB for 6 wks

## 2018-01-22 NOTE — Progress Notes (Addendum)
Minimal drainage noted to surgical site at L hip. Dressing reinforced. Pt tolerated well. Will continue to monitor.

## 2018-01-23 LAB — HEMOGLOBIN A1C
HEMOGLOBIN A1C: 6.2 % — AB (ref 4.8–5.6)
Mean Plasma Glucose: 131.24 mg/dL

## 2018-01-23 LAB — GLUCOSE, CAPILLARY
Glucose-Capillary: 144 mg/dL — ABNORMAL HIGH (ref 70–99)
Glucose-Capillary: 152 mg/dL — ABNORMAL HIGH (ref 70–99)
Glucose-Capillary: 156 mg/dL — ABNORMAL HIGH (ref 70–99)
Glucose-Capillary: 165 mg/dL — ABNORMAL HIGH (ref 70–99)
Glucose-Capillary: 181 mg/dL — ABNORMAL HIGH (ref 70–99)

## 2018-01-23 MED ORDER — CALCIUM CARBONATE ANTACID 500 MG PO CHEW
1.0000 | CHEWABLE_TABLET | Freq: Once | ORAL | Status: AC
  Administered 2018-01-23: 200 mg via ORAL
  Filled 2018-01-23: qty 1

## 2018-01-23 MED ORDER — HYDROXYZINE HCL 25 MG PO TABS
25.0000 mg | ORAL_TABLET | Freq: Once | ORAL | Status: AC
  Administered 2018-01-23: 25 mg via ORAL
  Filled 2018-01-23: qty 1
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
## 2018-01-23 NOTE — Plan of Care (Signed)
  Problem: Education: Goal: Knowledge of General Education information will improve Description: Including pain rating scale, medication(s)/side effects and non-pharmacologic comfort measures Outcome: Progressing   Problem: Nutrition: Goal: Adequate nutrition will be maintained Outcome: Progressing   Problem: Coping: Goal: Level of anxiety will decrease Outcome: Progressing   Problem: Elimination: Goal: Will not experience complications related to urinary retention Outcome: Progressing   Problem: Pain Managment: Goal: General experience of comfort will improve Outcome: Progressing   Problem: Safety: Goal: Ability to remain free from injury will improve Outcome: Progressing   Problem: Skin Integrity: Goal: Risk for impaired skin integrity will decrease Outcome: Progressing   

## 2018-01-23 NOTE — Progress Notes (Signed)
   Subjective: 1 Day Post-Op Procedure(s) (LRB): INTERTROCHANTRIC INTRAMEDULLARY NAIL FOR LEFT SUBTROCHANTRIC FRACTURE (Left) Patient reports pain as moderate and severe.  hollars and moans in pain. Stops when he starts a discussion about any subject and is comfortable. Falls asleep with pain meds.   Objective: Vital signs in last 24 hours: Temp:  [97.7 F (36.5 C)-98.3 F (36.8 C)] 98.3 F (36.8 C) (01/12 1157) Pulse Rate:  [94-117] 117 (01/12 1157) Resp:  [16-20] 20 (01/12 0944) BP: (123-146)/(55-73) 123/69 (01/12 1157) SpO2:  [95 %-100 %] 95 % (01/12 1157)  Intake/Output from previous day: 01/11 0701 - 01/12 0700 In: 2269.9 [P.O.:480; I.V.:1239.9; IV Piggyback:450] Out: 875 [Urine:725; Blood:150] Intake/Output this shift: Total I/O In: 240 [P.O.:240] Out: 225 [Urine:225]  Recent Labs    01/21/18 1848 01/21/18 1854  HGB 13.4 14.6   Recent Labs    01/21/18 1848 01/21/18 1854  WBC 9.9  --   RBC 5.64  --   HCT 44.6 43.0  PLT 340  --    Recent Labs    01/21/18 1848 01/21/18 1854  NA 137 138  K 4.3 4.3  CL 107 108  CO2 20*  --   BUN 17 20  CREATININE 1.08 1.00  GLUCOSE 109* 113*  CALCIUM 9.3  --    Recent Labs    01/21/18 1848  INR 0.97    dressing dry.  No results found.  Assessment/Plan: 1 Day Post-Op Procedure(s) (LRB): INTERTROCHANTRIC INTRAMEDULLARY NAIL FOR LEFT SUBTROCHANTRIC FRACTURE (Left) Up with therapy transfers.   [name] 01/23/2018, 1:26 PM

## 2018-01-23 NOTE — Progress Notes (Signed)
Just picked up pt
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
## 2018-01-24 ENCOUNTER — Encounter (HOSPITAL_COMMUNITY): Payer: Self-pay | Admitting: Orthopaedic Surgery

## 2018-01-24 ENCOUNTER — Encounter: Payer: Self-pay | Admitting: Family Medicine

## 2018-01-24 ENCOUNTER — Other Ambulatory Visit: Payer: Self-pay | Admitting: Family Medicine

## 2018-01-24 DIAGNOSIS — R911 Solitary pulmonary nodule: Secondary | ICD-10-CM

## 2018-01-24 DIAGNOSIS — IMO0001 Reserved for inherently not codable concepts without codable children: Secondary | ICD-10-CM

## 2018-01-24 DIAGNOSIS — I739 Peripheral vascular disease, unspecified: Secondary | ICD-10-CM

## 2018-01-24 LAB — CBC
HCT: 30.8 % — ABNORMAL LOW (ref 39.0–52.0)
Hemoglobin: 9.1 g/dL — ABNORMAL LOW (ref 13.0–17.0)
MCH: 23.3 pg — ABNORMAL LOW (ref 26.0–34.0)
MCHC: 29.5 g/dL — ABNORMAL LOW (ref 30.0–36.0)
MCV: 78.8 fL — ABNORMAL LOW (ref 80.0–100.0)
NRBC: 0 % (ref 0.0–0.2)
Platelets: 209 10*3/uL (ref 150–400)
RBC: 3.91 MIL/uL — ABNORMAL LOW (ref 4.22–5.81)
RDW: 18.2 % — ABNORMAL HIGH (ref 11.5–15.5)
WBC: 7.2 10*3/uL (ref 4.0–10.5)

## 2018-01-24 LAB — GLUCOSE, CAPILLARY
Glucose-Capillary: 170 mg/dL — ABNORMAL HIGH (ref 70–99)
Glucose-Capillary: 127 mg/dL — ABNORMAL HIGH (ref 70–99)
Glucose-Capillary: 200 mg/dL — ABNORMAL HIGH (ref 70–99)

## 2018-01-24 MED ORDER — POLYETHYLENE GLYCOL 3350 17 G PO PACK
17.0000 g | PACK | Freq: Every day | ORAL | Status: DC
  Administered 2018-01-24: 17 g via ORAL
  Filled 2018-01-24: qty 1

## 2018-01-24 MED ORDER — ASPIRIN 325 MG PO TABS: 325.0000 mg | ORAL_TABLET | Freq: Every day | ORAL | 0 refills | Status: DC

## 2018-01-24 MED ORDER — POLYETHYLENE GLYCOL 3350 17 G PO PACK: 17.0000 g | PACK | Freq: Every day | ORAL | 0 refills | Status: DC

## 2018-01-24 MED ORDER — SENNOSIDES-DOCUSATE SODIUM 8.6-50 MG PO TABS: 2.0000 | ORAL_TABLET | Freq: Every day | ORAL | 1 refills | Status: DC | PRN

## 2018-01-24 MED ORDER — INSULIN ASPART 100 UNIT/ML ~~LOC~~ SOLN
0.0000 [IU] | Freq: Three times a day (TID) | SUBCUTANEOUS | Status: DC
  Administered 2018-01-24: 2 [IU] via SUBCUTANEOUS
  Administered 2018-01-24: 1 [IU] via SUBCUTANEOUS

## 2018-01-24 MED ORDER — OXYCODONE-ACETAMINOPHEN 5-325 MG PO TABS: 1.0000 | ORAL_TABLET | Freq: Four times a day (QID) | ORAL | 0 refills | Status: DC | PRN

## 2018-01-24 MED ORDER — ASPIRIN 325 MG PO TABS: 325.0000 mg | ORAL_TABLET | Freq: Every day | ORAL | Status: DC

## 2018-01-24 MED ORDER — INSULIN ASPART 100 UNIT/ML ~~LOC~~ SOLN: 0.0000 [IU] | Freq: Every day | SUBCUTANEOUS | Status: DC

## 2018-01-24 NOTE — Discharge Instructions (Signed)
Use ice on and off to help pain in your hip and left thigh.  Call for appt with Dr. Lorin Mercy in about one week.  Take one regular aspirin per day for 4 wks to help decrease risk of blood clots. Leave dressing on until you see Dr. Lorin Mercy in one week. Your Rx for percocet was sent to [facility] have a nodule on your lung which may be cancerous. Your doctor will need to keep an eye on this and repeat a CT scan in the future to see if it is growing. I recommend you stop smoking.    You were cared for by a hospitalist during your hospital stay. If you have any questions about your discharge medications or the care you received while you were in the hospital after you are discharged, you can call the unit and asked to speak with the hospitalist on call if the hospitalist that took care of you is not available. Once you are discharged, your primary care physician will handle any further medical issues.   Please note that NO REFILLS for any discharge medications will be authorized once you are discharged, as it is imperative that you return to your primary care physician (or establish a relationship with a primary care physician if you do not have one) for your aftercare needs so that they can reassess your need for medications and monitor your lab values.  Please take all your medications with you for your next visit with your Primary MD. Please ask your Primary MD to get all Hospital records sent to his/her office. Please request your Primary MD to go over all hospital test results at the follow up.   If you experience worsening of your admission symptoms, develop shortness of breath, chest pain, suicidal or homicidal thoughts or a life threatening emergency, you must seek medical attention immediately by calling 911 or calling your MD.   Dennis Bast must read the complete instructions/literature along with all the possible adverse reactions/side effects for all the medicines you take including new  medications that have been prescribed to you. Take new medicines after you have completely understood and accpet all the possible adverse reactions/side effects.    Do not drive when taking pain medications or sedatives.     Do not take more than prescribed Pain, Sleep and Anxiety Medications   If you have smoked or chewed Tobacco in the last 2 yrs please stop. Stop any regular alcohol  and or recreational drug use.   Wear Seat belts while driving.
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
## 2018-01-24 NOTE — Progress Notes (Signed)
Patient ID: [name], male   DOB: 1960/02/16, 58 y.o.   MRN: [id number]   Patient with abnormal chest CT 10/09/17 showing lung nodule with 6 month follow-up recommended and patient with recent hospitalization and chest CT done 01/21/2018 also noting right posterior lung nodule with 2.2 cm nodular density and repeat CT recommended in 3 months. Order will be placed for chest CT in 3 months.

## 2018-01-24 NOTE — Plan of Care (Signed)

## 2018-01-24 NOTE — Progress Notes (Signed)
Physical Therapy Treatment Note  Patient suffers from L femur fracture (with prev R LE amputation) which impairs their ability to perform daily activities like walking, transfers, general mobility in the home.  A walker alone will not resolve the issues with performing activities of daily living. A lightweight wheelchair will allow patient to safely perform daily activities.  The patient can self propel in the home or has a caregiver who can provide assistance.     Roney Marion, [place]  Acute Rehabilitation Services Pager [phone] Office [phone]

## 2018-01-24 NOTE — Progress Notes (Signed)
Patient discharging home. Discharge instructions explained to patient and daughter and they both verbalized understanding. Took all personal belongings. No further questions or concerns voiced. [facility] called for transportation.

## 2018-01-24 NOTE — Plan of Care (Signed)

## 2018-01-25 ENCOUNTER — Telehealth: Payer: Self-pay | Admitting: Family Medicine
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
## 2018-01-26 ENCOUNTER — Telehealth: Payer: Self-pay | Admitting: *Deleted
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
## 2018-01-26 NOTE — Telephone Encounter (Signed)
-----   Message from Antony Blackbird, MD sent at 01/19/2018  8:59 AM EST ----- Please notify patient that his CMET was normal- he is known to be diabetic and postprandial glucose at his visit was 148. CBC showed that his hemoglobin is normal at 13.1-anemia has resolved but he would still benefit from a daily multivitamin

## 2018-01-26 NOTE — Telephone Encounter (Signed)
Medical Assistant left message on PT cell voicemail. Voicemail states to give a call back to [place] with [facility] at [phone].

## 2018-01-27 NOTE — Anesthesia Postprocedure Evaluation (Signed)
Anesthesia Post Note  Patient: [name]  Procedure(s) Performed: INTERTROCHANTRIC INTRAMEDULLARY NAIL FOR LEFT SUBTROCHANTRIC FRACTURE (Left Hip)     Patient location during evaluation: PACU Anesthesia Type: General Level of consciousness: awake and alert Pain management: pain level controlled Vital Signs Assessment: post-procedure vital signs reviewed and stable Respiratory status: spontaneous breathing, nonlabored ventilation, respiratory function stable and patient connected to nasal cannula oxygen Cardiovascular status: blood pressure returned to baseline and stable Postop Assessment: no apparent nausea or vomiting Anesthetic complications: no    Last Vitals:  Vitals:   01/24/18 0409 01/24/18 0914  BP: 124/68   Pulse: 96   Resp: 16   Temp: 36.7 C   SpO2: 98% 92%    Last Pain:  Vitals:   01/24/18 0800  TempSrc:   PainSc: 10-Worst pain ever                 Clarke Amburn

## 2018-02-02 ENCOUNTER — Inpatient Hospital Stay (INDEPENDENT_AMBULATORY_CARE_PROVIDER_SITE_OTHER): Payer: Self-pay | Admitting: Orthopaedic Surgery

## 2018-02-02 ENCOUNTER — Other Ambulatory Visit: Payer: Self-pay

## 2018-02-02 DIAGNOSIS — I739 Peripheral vascular disease, unspecified: Secondary | ICD-10-CM

## 2018-02-02 DIAGNOSIS — E119 Type 2 diabetes mellitus without complications: Secondary | ICD-10-CM

## 2018-02-02 DIAGNOSIS — K219 Gastro-esophageal reflux disease without esophagitis: Secondary | ICD-10-CM

## 2018-02-02 DIAGNOSIS — I639 Cerebral infarction, unspecified: Secondary | ICD-10-CM

## 2018-02-02 DIAGNOSIS — J449 Chronic obstructive pulmonary disease, unspecified: Secondary | ICD-10-CM

## 2018-02-02 MED ORDER — ALBUTEROL SULFATE HFA 108 (90 BASE) MCG/ACT IN AERS: 2.0000 | INHALATION_SPRAY | RESPIRATORY_TRACT | 1 refills | Status: DC | PRN

## 2018-02-02 MED ORDER — GLUCOSE BLOOD VI STRP: ORAL_STRIP | 3 refills | Status: DC

## 2018-02-02 MED ORDER — ACCU-CHEK SOFT TOUCH LANCETS MISC: 3 refills | Status: DC

## 2018-02-02 MED ORDER — CLOPIDOGREL BISULFATE 75 MG PO TABS: 75.0000 mg | ORAL_TABLET | Freq: Every day | ORAL | 3 refills | Status: DC

## 2018-02-02 MED ORDER — ALBUTEROL SULFATE (2.5 MG/3ML) 0.083% IN NEBU: 2.5000 mg | INHALATION_SOLUTION | Freq: Four times a day (QID) | RESPIRATORY_TRACT | 1 refills | Status: AC | PRN

## 2018-02-02 MED ORDER — HYDROXYZINE PAMOATE 25 MG PO CAPS: 25.0000 mg | ORAL_CAPSULE | Freq: Two times a day (BID) | ORAL | 1 refills | Status: DC

## 2018-02-02 MED ORDER — PANTOPRAZOLE SODIUM 20 MG PO TBEC: 20.0000 mg | DELAYED_RELEASE_TABLET | Freq: Every day | ORAL | 1 refills | Status: DC

## 2018-02-02 NOTE — Telephone Encounter (Signed)
Call from Pharmacy was transferred to me from [place] at the front desk. The pharmacist at [facility] states that they are going to be the main pharmacy filling for [name] and she had had trouble tracking down where the patient had been filling his prescriptions. They are taking over his care and doing bubble packs for him to help with adherence. Please authorize the following rx's in 90 day supplies when appropriate.

## 2018-02-03 ENCOUNTER — Telehealth (INDEPENDENT_AMBULATORY_CARE_PROVIDER_SITE_OTHER): Payer: Self-pay | Admitting: Orthopaedic Surgery

## 2018-02-03 ENCOUNTER — Other Ambulatory Visit (HOSPITAL_COMMUNITY): Payer: Self-pay | Admitting: Orthopaedic Surgery

## 2018-02-03 MED ORDER — TRAMADOL HCL 50 MG PO TABS: 50.0000 mg | ORAL_TABLET | Freq: Three times a day (TID) | ORAL | 0 refills | Status: DC | PRN

## 2018-02-03 NOTE — Telephone Encounter (Signed)
OK for ultram now. Time to get off percocet.   Ultram # 30  1 po tid prn pain thanks

## 2018-02-03 NOTE — Telephone Encounter (Signed)
Called Tramadol to patient's pharmacy.  I left voicemail for patient advising.

## 2018-02-03 NOTE — Telephone Encounter (Signed)
Patient called needing Rx refilled (Percocet) The number patient is [phone]

## 2018-02-03 NOTE — Telephone Encounter (Signed)
Please advise.  Patient was only given 7 day supply per pharmacy request.

## 2018-02-03 NOTE — Telephone Encounter (Signed)
See other note ultram

## 2018-02-03 NOTE — Telephone Encounter (Signed)
Please advise 

## 2018-02-04 ENCOUNTER — Other Ambulatory Visit: Payer: Self-pay

## 2018-02-04 MED ORDER — RANITIDINE HCL 150 MG PO TABS: 300.0000 mg | ORAL_TABLET | Freq: Every day | ORAL | 2 refills | Status: DC

## 2018-02-04 NOTE — Telephone Encounter (Signed)
Pharmacy reports that patient needs refill on ranitidine, we have not filled this for him before, please send rx ASAP if appropriate, the pharmacy is hoping to deliver his meds later this AM.

## 2018-02-09 ENCOUNTER — Telehealth: Payer: Self-pay

## 2018-02-09 NOTE — Telephone Encounter (Signed)
Signed Medication reconciliation form faxed back to [facility] of [place]

## 2018-02-10 ENCOUNTER — Telehealth (INDEPENDENT_AMBULATORY_CARE_PROVIDER_SITE_OTHER): Payer: Self-pay | Admitting: Orthopaedic Surgery

## 2018-02-10 NOTE — Telephone Encounter (Signed)
Dr. Lorin Mercy will discuss at appointment tomorrow.

## 2018-02-10 NOTE — Telephone Encounter (Signed)
Pt called stating [facility] only came to his house one time.  Pt says he called [facility] and they told him to call is Dr office about why nobody is returned.  Pt is also upset about how he received tramadol instead of #5 percocet.

## 2018-02-11 ENCOUNTER — Ambulatory Visit (INDEPENDENT_AMBULATORY_CARE_PROVIDER_SITE_OTHER): Payer: Medicaid Other | Admitting: Orthopaedic Surgery

## 2018-02-11 ENCOUNTER — Ambulatory Visit (INDEPENDENT_AMBULATORY_CARE_PROVIDER_SITE_OTHER): Payer: Medicaid Other

## 2018-02-11 ENCOUNTER — Encounter (INDEPENDENT_AMBULATORY_CARE_PROVIDER_SITE_OTHER): Payer: Self-pay | Admitting: Orthopaedic Surgery

## 2018-02-11 VITALS — BP 140/72 | HR 110 | Ht 73.0 in | Wt 150.0 lb

## 2018-02-11 DIAGNOSIS — M79605 Pain in left leg: Secondary | ICD-10-CM
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
## 2018-02-16 ENCOUNTER — Telehealth (INDEPENDENT_AMBULATORY_CARE_PROVIDER_SITE_OTHER): Payer: Self-pay | Admitting: Orthopaedic Surgery

## 2018-02-16 NOTE — Telephone Encounter (Signed)
Received voicemail message from patient stating Dr Lorin Mercy will not write him anything for pain. Patient asked if he can be admitted in the hospital so that he can get  his leg amputated. Patient said he can't take the pain anymore. The number to contact patient is [phone]

## 2018-02-16 NOTE — Telephone Encounter (Signed)
I called and left him message. Amputation would still hurt. He pain will continue to decrease. FYI

## 2018-02-16 NOTE — Telephone Encounter (Signed)
Please advise 

## 2018-02-17 NOTE — Telephone Encounter (Signed)
noted 

## 2018-02-23 ENCOUNTER — Telehealth: Payer: Self-pay | Admitting: Family Medicine

## 2018-02-23 NOTE — Telephone Encounter (Signed)
He should have received a referral to home health after his hospitalization. I would need to see him in office for a face to face visit in order to make new referrals so if he can please schedule with me or another physician here for face to face follow-up after hospitalization for leg fracture. Refills can be done at his visit unless there is a non-controlled substance that he needs filled urgently. He would need to contact Orthopedics regarding any pain medications

## 2018-02-23 NOTE — Telephone Encounter (Signed)
Per PCP patient needs to be scheduled an appointment to address concerns.

## 2018-02-23 NOTE — Telephone Encounter (Signed)
Patient called because he would like a referral/orders for advanced home care for physical therapy to help him learn how to walk again. Please follow up.

## 2018-02-23 NOTE — Telephone Encounter (Signed)
Patient requesting refill referral to PT and [facility] to assist in walking.

## 2018-02-26 ENCOUNTER — Emergency Department (HOSPITAL_COMMUNITY): Payer: Medicaid Other

## 2018-02-26 ENCOUNTER — Emergency Department (HOSPITAL_COMMUNITY)
Admission: EM | Admit: 2018-02-26 | Discharge: 2018-02-26 | Disposition: A | Discharge status: Home / Self Care | Payer: Medicaid Other | Attending: Emergency Medicine | Admitting: Emergency Medicine

## 2018-02-26 ENCOUNTER — Encounter (HOSPITAL_COMMUNITY): Payer: Self-pay | Admitting: *Deleted

## 2018-02-26 DIAGNOSIS — J189 Pneumonia, unspecified organism: Secondary | ICD-10-CM

## 2018-02-26 DIAGNOSIS — J449 Chronic obstructive pulmonary disease, unspecified: Secondary | ICD-10-CM | POA: Insufficient documentation

## 2018-02-26 DIAGNOSIS — M79605 Pain in left leg: Secondary | ICD-10-CM | POA: Diagnosis not present

## 2018-02-26 DIAGNOSIS — Z79899 Other long term (current) drug therapy: Secondary | ICD-10-CM | POA: Diagnosis not present

## 2018-02-26 DIAGNOSIS — Z96662 Presence of left artificial ankle joint: Secondary | ICD-10-CM | POA: Insufficient documentation

## 2018-02-26 DIAGNOSIS — F17210 Nicotine dependence, cigarettes, uncomplicated: Secondary | ICD-10-CM | POA: Diagnosis not present

## 2018-02-26 DIAGNOSIS — R05 Cough: Secondary | ICD-10-CM | POA: Diagnosis present

## 2018-02-26 DIAGNOSIS — Z7902 Long term (current) use of antithrombotics/antiplatelets: Secondary | ICD-10-CM | POA: Diagnosis not present

## 2018-02-26 DIAGNOSIS — I509 Heart failure, unspecified: Secondary | ICD-10-CM | POA: Diagnosis not present

## 2018-02-26 DIAGNOSIS — E119 Type 2 diabetes mellitus without complications: Secondary | ICD-10-CM | POA: Diagnosis not present

## 2018-02-26 DIAGNOSIS — I110 Hypertensive heart disease with heart failure: Secondary | ICD-10-CM | POA: Diagnosis not present

## 2018-02-26 MED ORDER — DOXYCYCLINE HYCLATE 100 MG PO TABS: 100.0000 mg | ORAL_TABLET | Freq: Once | ORAL | Status: DC

## 2018-02-26 MED ORDER — DOXYCYCLINE HYCLATE 100 MG PO CAPS: 100.0000 mg | ORAL_CAPSULE | Freq: Two times a day (BID) | ORAL | 0 refills | Status: DC

## 2018-02-26 MED ORDER — ONDANSETRON 4 MG PO TBDP
8.0000 mg | ORAL_TABLET | Freq: Once | ORAL | Status: AC
  Administered 2018-02-26: 8 mg via ORAL
  Filled 2018-02-26: qty 2

## 2018-02-26 MED ORDER — ALBUTEROL SULFATE HFA 108 (90 BASE) MCG/ACT IN AERS: 1.0000 | INHALATION_SPRAY | Freq: Four times a day (QID) | RESPIRATORY_TRACT | 0 refills | Status: DC | PRN

## 2018-02-26 MED ORDER — TRAMADOL HCL 50 MG PO TABS
50.0000 mg | ORAL_TABLET | Freq: Once | ORAL | Status: DC
  Filled 2018-02-26: qty 1

## 2018-02-26 MED ORDER — DOXYCYCLINE HYCLATE 100 MG PO TABS
100.0000 mg | ORAL_TABLET | Freq: Once | ORAL | Status: AC
  Administered 2018-02-26: 100 mg via ORAL
  Filled 2018-02-26: qty 1

## 2018-02-26 NOTE — ED Notes (Signed)
Pt given sandwich bag and is eating without difficulty. Feels better.
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
## 2018-02-26 NOTE — ED Notes (Signed)
Pt verbalized understanding of d/c instructions and has no further questions, VSS, NAD. Pt to follow up with health and wellness. Sent home on antibiotics.

## 2018-02-26 NOTE — ED Notes (Signed)
Pt happy with zofran stating "I like those, they're already starting to work"

## 2018-02-26 NOTE — ED Notes (Signed)
Pt states that he wants something for nausea. Pt yelling in hallways about getting medication. Pt declined tramadol but wants nausea medications. Dr Venora Maples notified and gave verbal order for 8mg  zofran odt.
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
## 2018-02-26 NOTE — Discharge Instructions (Signed)
Please call for a new pain clinic to manage your recurrent/chronic pain

## 2018-02-26 NOTE — ED Notes (Signed)
Patient transported to X-ray 

## 2018-02-28 MED FILL — PROAIR HFA 90 MCG INHALER: 108 (90 BAS | 25 days supply | Qty: 9 | Fill #0

## 2018-02-28 MED FILL — DOXYCYCLINE HYCLATE 100 MG: 100 | 10 days supply | Qty: 20 | Fill #0

## 2018-03-02 ENCOUNTER — Other Ambulatory Visit: Payer: Self-pay

## 2018-03-02 ENCOUNTER — Emergency Department (HOSPITAL_COMMUNITY)
Admission: EM | Admit: 2018-03-02 | Discharge: 2018-03-03 | Disposition: A | Discharge status: Home / Self Care | Payer: Medicaid Other | Attending: Emergency Medicine | Admitting: Emergency Medicine

## 2018-03-02 ENCOUNTER — Emergency Department (HOSPITAL_COMMUNITY): Payer: Medicaid Other

## 2018-03-02 ENCOUNTER — Inpatient Hospital Stay: Payer: Self-pay | Admitting: Critical Care Medicine

## 2018-03-02 ENCOUNTER — Encounter (HOSPITAL_COMMUNITY): Payer: Self-pay

## 2018-03-02 DIAGNOSIS — I110 Hypertensive heart disease with heart failure: Secondary | ICD-10-CM | POA: Diagnosis not present

## 2018-03-02 DIAGNOSIS — E119 Type 2 diabetes mellitus without complications: Secondary | ICD-10-CM | POA: Insufficient documentation

## 2018-03-02 DIAGNOSIS — Z79899 Other long term (current) drug therapy: Secondary | ICD-10-CM | POA: Diagnosis not present

## 2018-03-02 DIAGNOSIS — I509 Heart failure, unspecified: Secondary | ICD-10-CM | POA: Diagnosis not present

## 2018-03-02 DIAGNOSIS — J189 Pneumonia, unspecified organism: Secondary | ICD-10-CM | POA: Diagnosis not present

## 2018-03-02 DIAGNOSIS — R112 Nausea with vomiting, unspecified: Secondary | ICD-10-CM

## 2018-03-02 DIAGNOSIS — J449 Chronic obstructive pulmonary disease, unspecified: Secondary | ICD-10-CM | POA: Insufficient documentation

## 2018-03-02 DIAGNOSIS — R110 Nausea: Secondary | ICD-10-CM | POA: Diagnosis present

## 2018-03-02 DIAGNOSIS — Z7902 Long term (current) use of antithrombotics/antiplatelets: Secondary | ICD-10-CM | POA: Insufficient documentation

## 2018-03-02 DIAGNOSIS — F17210 Nicotine dependence, cigarettes, uncomplicated: Secondary | ICD-10-CM | POA: Insufficient documentation

## 2018-03-02 DIAGNOSIS — Z7982 Long term (current) use of aspirin: Secondary | ICD-10-CM | POA: Insufficient documentation

## 2018-03-02 LAB — CBC
HCT: 41.4 % (ref 39.0–52.0)
Hemoglobin: 11.2 g/dL — ABNORMAL LOW (ref 13.0–17.0)
MCH: 22.9 pg — ABNORMAL LOW (ref 26.0–34.0)
MCHC: 27.1 g/dL — ABNORMAL LOW (ref 30.0–36.0)
MCV: 84.7 fL (ref 80.0–100.0)
NRBC: 0 % (ref 0.0–0.2)
Platelets: 689 10*3/uL — ABNORMAL HIGH (ref 150–400)
RBC: 4.89 MIL/uL (ref 4.22–5.81)
RDW: 17.6 % — AB (ref 11.5–15.5)
WBC: 13.4 10*3/uL — ABNORMAL HIGH (ref 4.0–10.5)

## 2018-03-02 LAB — COMPREHENSIVE METABOLIC PANEL
ALT: 25 U/L (ref 0–44)
AST: 20 U/L (ref 15–41)
Albumin: 3.4 g/dL — ABNORMAL LOW (ref 3.5–5.0)
Alkaline Phosphatase: 124 U/L (ref 38–126)
Anion gap: 11 (ref 5–15)
BILIRUBIN TOTAL: 0.2 mg/dL — AB (ref 0.3–1.2)
BUN: 22 mg/dL — ABNORMAL HIGH (ref 6–20)
CO2: 22 mmol/L (ref 22–32)
CREATININE: 1.15 mg/dL (ref 0.61–1.24)
Calcium: 9.2 mg/dL (ref 8.9–10.3)
Chloride: 101 mmol/L (ref 98–111)
GFR calc Af Amer: >60 mL/min (ref >60)
GFR calc non Af Amer: >60 mL/min (ref >60)
Glucose, Bld: 153 mg/dL — ABNORMAL HIGH (ref 70–99)
Potassium: 3.9 mmol/L (ref 3.5–5.1)
Sodium: 134 mmol/L — ABNORMAL LOW (ref 135–145)
Total Protein: 9 g/dL — ABNORMAL HIGH (ref 6.5–8.1)

## 2018-03-02 LAB — LIPASE, BLOOD: Lipase: 33 U/L (ref 11–51)

## 2018-03-02 MED ORDER — SODIUM CHLORIDE 0.9 % IV BOLUS
1000.0000 mL | Freq: Once | INTRAVENOUS | Status: AC
  Administered 2018-03-03: 1000 mL via INTRAVENOUS

## 2018-03-02 MED ORDER — ONDANSETRON 4 MG PO TBDP
4.0000 mg | ORAL_TABLET | Freq: Once | ORAL | Status: AC | PRN
  Administered 2018-03-02: 4 mg via ORAL
  Filled 2018-03-02: qty 1

## 2018-03-02 MED ORDER — SODIUM CHLORIDE 0.9% FLUSH: 3.0000 mL | Freq: Once | INTRAVENOUS | Status: DC

## 2018-03-02 MED ORDER — ONDANSETRON HCL 4 MG/2ML IJ SOLN
4.0000 mg | Freq: Once | INTRAMUSCULAR | Status: AC
  Administered 2018-03-03: 4 mg via INTRAVENOUS
  Filled 2018-03-02: qty 2

## 2018-03-02 MED ORDER — KETOROLAC TROMETHAMINE 30 MG/ML IJ SOLN
30.0000 mg | Freq: Once | INTRAMUSCULAR | Status: AC
  Administered 2018-03-03: 30 mg via INTRAVENOUS
  Filled 2018-03-02: qty 1
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
## 2018-03-02 NOTE — ED Triage Notes (Signed)
Per EMS- Patient c/o not feeling well and having nausea. Patient told EMS he had pneumonia. Lungs clear. Patient also c/o pain of the right leg.

## 2018-03-03 LAB — URINALYSIS, ROUTINE W REFLEX MICROSCOPIC
Bilirubin Urine: NEGATIVE
Glucose, UA: NEGATIVE mg/dL
Ketones, ur: NEGATIVE mg/dL
Leukocytes,Ua: NEGATIVE
Nitrite: NEGATIVE
Protein, ur: NEGATIVE mg/dL
Specific Gravity, Urine: 1.016 (ref 1.005–1.030)
pH: 6 (ref 5.0–8.0)

## 2018-03-03 MED ORDER — ONDANSETRON 4 MG PO TBDP: 4.0000 mg | ORAL_TABLET | Freq: Three times a day (TID) | ORAL | 0 refills | Status: DC | PRN

## 2018-03-03 NOTE — ED Notes (Signed)
Pt is going to wait until the morning to call some transportation services for a ride

## 2018-03-03 NOTE — Discharge Instructions (Signed)
Continue doxycycline.  Can use zofran as needed for nausea. Follow-up with your primary care doctor. Return here for any new/acute changes.

## 2018-03-03 NOTE — ED Notes (Signed)
[facility] called for transportation home 

## 2018-03-03 NOTE — ED Notes (Signed)
SW notified and will see patient regarding his transportation needs
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
## 2018-03-03 NOTE — ED Notes (Signed)
[facility] will not transport the patient home with his wheelchair

## 2018-03-03 NOTE — ED Notes (Signed)
Pt given a a sandwich and a coke

## 2018-03-11 ENCOUNTER — Ambulatory Visit (INDEPENDENT_AMBULATORY_CARE_PROVIDER_SITE_OTHER): Payer: Medicaid Other | Admitting: Orthopaedic Surgery

## 2018-03-15 ENCOUNTER — Other Ambulatory Visit: Payer: Self-pay

## 2018-03-15 DIAGNOSIS — F17200 Nicotine dependence, unspecified, uncomplicated: Secondary | ICD-10-CM

## 2018-03-15 DIAGNOSIS — I779 Disorder of arteries and arterioles, unspecified: Secondary | ICD-10-CM

## 2018-03-15 DIAGNOSIS — Z95828 Presence of other vascular implants and grafts: Secondary | ICD-10-CM

## 2018-03-15 DIAGNOSIS — I739 Peripheral vascular disease, unspecified: Secondary | ICD-10-CM

## 2018-03-16 ENCOUNTER — Emergency Department (HOSPITAL_COMMUNITY): Payer: Medicaid Other

## 2018-03-16 ENCOUNTER — Other Ambulatory Visit: Payer: Self-pay

## 2018-03-16 ENCOUNTER — Inpatient Hospital Stay (HOSPITAL_COMMUNITY)
Admission: EM | Admit: 2018-03-16 | Discharge: 2018-03-22 | DRG: 177 | Disposition: A | Discharge status: Home Health | Payer: Medicaid Other | Attending: Internal Medicine | Admitting: Internal Medicine

## 2018-03-16 ENCOUNTER — Encounter (HOSPITAL_COMMUNITY): Payer: Self-pay | Admitting: Emergency Medicine

## 2018-03-16 DIAGNOSIS — Z8614 Personal history of Methicillin resistant Staphylococcus aureus infection: Secondary | ICD-10-CM | POA: Diagnosis not present

## 2018-03-16 DIAGNOSIS — F411 Generalized anxiety disorder: Secondary | ICD-10-CM | POA: Diagnosis present

## 2018-03-16 DIAGNOSIS — G8929 Other chronic pain: Secondary | ICD-10-CM | POA: Diagnosis not present

## 2018-03-16 DIAGNOSIS — Z885 Allergy status to narcotic agent status: Secondary | ICD-10-CM | POA: Diagnosis not present

## 2018-03-16 DIAGNOSIS — Z79891 Long term (current) use of opiate analgesic: Secondary | ICD-10-CM

## 2018-03-16 DIAGNOSIS — I509 Heart failure, unspecified: Secondary | ICD-10-CM | POA: Diagnosis present

## 2018-03-16 DIAGNOSIS — G4733 Obstructive sleep apnea (adult) (pediatric): Secondary | ICD-10-CM | POA: Diagnosis present

## 2018-03-16 DIAGNOSIS — M79672 Pain in left foot: Secondary | ICD-10-CM | POA: Diagnosis not present

## 2018-03-16 DIAGNOSIS — Z7902 Long term (current) use of antithrombotics/antiplatelets: Secondary | ICD-10-CM

## 2018-03-16 DIAGNOSIS — F1490 Cocaine use, unspecified, uncomplicated: Secondary | ICD-10-CM | POA: Diagnosis present

## 2018-03-16 DIAGNOSIS — J850 Gangrene and necrosis of lung: Principal | ICD-10-CM | POA: Diagnosis present

## 2018-03-16 DIAGNOSIS — K219 Gastro-esophageal reflux disease without esophagitis: Secondary | ICD-10-CM

## 2018-03-16 DIAGNOSIS — Z720 Tobacco use: Secondary | ICD-10-CM

## 2018-03-16 DIAGNOSIS — D509 Iron deficiency anemia, unspecified: Secondary | ICD-10-CM | POA: Diagnosis present

## 2018-03-16 DIAGNOSIS — Z681 Body mass index (BMI) 19 or less, adult: Secondary | ICD-10-CM | POA: Diagnosis not present

## 2018-03-16 DIAGNOSIS — Z9119 Patient's noncompliance with other medical treatment and regimen: Secondary | ICD-10-CM | POA: Diagnosis not present

## 2018-03-16 DIAGNOSIS — E43 Unspecified severe protein-calorie malnutrition: Secondary | ICD-10-CM

## 2018-03-16 DIAGNOSIS — E119 Type 2 diabetes mellitus without complications: Secondary | ICD-10-CM | POA: Diagnosis not present

## 2018-03-16 DIAGNOSIS — E440 Moderate protein-calorie malnutrition: Secondary | ICD-10-CM

## 2018-03-16 DIAGNOSIS — Z981 Arthrodesis status: Secondary | ICD-10-CM

## 2018-03-16 DIAGNOSIS — Z8701 Personal history of pneumonia (recurrent): Secondary | ICD-10-CM

## 2018-03-16 DIAGNOSIS — R918 Other nonspecific abnormal finding of lung field: Secondary | ICD-10-CM | POA: Diagnosis not present

## 2018-03-16 DIAGNOSIS — Z7982 Long term (current) use of aspirin: Secondary | ICD-10-CM

## 2018-03-16 DIAGNOSIS — Z888 Allergy status to other drugs, medicaments and biological substances status: Secondary | ICD-10-CM | POA: Diagnosis not present

## 2018-03-16 DIAGNOSIS — F17210 Nicotine dependence, cigarettes, uncomplicated: Secondary | ICD-10-CM | POA: Diagnosis present

## 2018-03-16 DIAGNOSIS — E785 Hyperlipidemia, unspecified: Secondary | ICD-10-CM | POA: Diagnosis present

## 2018-03-16 DIAGNOSIS — Z91013 Allergy to seafood: Secondary | ICD-10-CM | POA: Diagnosis not present

## 2018-03-16 DIAGNOSIS — M069 Rheumatoid arthritis, unspecified: Secondary | ICD-10-CM | POA: Diagnosis present

## 2018-03-16 DIAGNOSIS — J42 Unspecified chronic bronchitis: Secondary | ICD-10-CM

## 2018-03-16 DIAGNOSIS — B370 Candidal stomatitis: Secondary | ICD-10-CM | POA: Diagnosis present

## 2018-03-16 DIAGNOSIS — Z89611 Acquired absence of right leg above knee: Secondary | ICD-10-CM | POA: Diagnosis not present

## 2018-03-16 DIAGNOSIS — D638 Anemia in other chronic diseases classified elsewhere: Secondary | ICD-10-CM | POA: Diagnosis present

## 2018-03-16 DIAGNOSIS — I739 Peripheral vascular disease, unspecified: Secondary | ICD-10-CM | POA: Diagnosis present

## 2018-03-16 DIAGNOSIS — R109 Unspecified abdominal pain: Secondary | ICD-10-CM | POA: Diagnosis not present

## 2018-03-16 DIAGNOSIS — E860 Dehydration: Secondary | ICD-10-CM

## 2018-03-16 DIAGNOSIS — F1910 Other psychoactive substance abuse, uncomplicated: Secondary | ICD-10-CM

## 2018-03-16 DIAGNOSIS — Z8673 Personal history of transient ischemic attack (TIA), and cerebral infarction without residual deficits: Secondary | ICD-10-CM

## 2018-03-16 DIAGNOSIS — Z79899 Other long term (current) drug therapy: Secondary | ICD-10-CM

## 2018-03-16 DIAGNOSIS — I110 Hypertensive heart disease with heart failure: Secondary | ICD-10-CM | POA: Diagnosis present

## 2018-03-16 DIAGNOSIS — Z808 Family history of malignant neoplasm of other organs or systems: Secondary | ICD-10-CM

## 2018-03-16 DIAGNOSIS — J449 Chronic obstructive pulmonary disease, unspecified: Secondary | ICD-10-CM | POA: Diagnosis present

## 2018-03-16 DIAGNOSIS — Z86711 Personal history of pulmonary embolism: Secondary | ICD-10-CM | POA: Diagnosis present

## 2018-03-16 DIAGNOSIS — Z881 Allergy status to other antibiotic agents status: Secondary | ICD-10-CM | POA: Diagnosis not present

## 2018-03-16 DIAGNOSIS — Z8349 Family history of other endocrine, nutritional and metabolic diseases: Secondary | ICD-10-CM

## 2018-03-16 DIAGNOSIS — E1151 Type 2 diabetes mellitus with diabetic peripheral angiopathy without gangrene: Secondary | ICD-10-CM | POA: Diagnosis present

## 2018-03-16 DIAGNOSIS — Z8619 Personal history of other infectious and parasitic diseases: Secondary | ICD-10-CM

## 2018-03-16 DIAGNOSIS — G894 Chronic pain syndrome: Secondary | ICD-10-CM | POA: Diagnosis present

## 2018-03-16 DIAGNOSIS — K0889 Other specified disorders of teeth and supporting structures: Secondary | ICD-10-CM | POA: Diagnosis present

## 2018-03-16 DIAGNOSIS — G4730 Sleep apnea, unspecified: Secondary | ICD-10-CM | POA: Diagnosis not present

## 2018-03-16 DIAGNOSIS — I10 Essential (primary) hypertension: Secondary | ICD-10-CM | POA: Diagnosis not present

## 2018-03-16 DIAGNOSIS — Z89511 Acquired absence of right leg below knee: Secondary | ICD-10-CM | POA: Diagnosis not present

## 2018-03-16 DIAGNOSIS — Z7951 Long term (current) use of inhaled steroids: Secondary | ICD-10-CM

## 2018-03-16 DIAGNOSIS — B379 Candidiasis, unspecified: Secondary | ICD-10-CM | POA: Diagnosis not present

## 2018-03-16 DIAGNOSIS — Z800 Family history of malignant neoplasm of digestive organs: Secondary | ICD-10-CM

## 2018-03-16 DIAGNOSIS — R112 Nausea with vomiting, unspecified: Secondary | ICD-10-CM | POA: Diagnosis not present

## 2018-03-16 DIAGNOSIS — Z8249 Family history of ischemic heart disease and other diseases of the circulatory system: Secondary | ICD-10-CM

## 2018-03-16 DIAGNOSIS — Z95828 Presence of other vascular implants and grafts: Secondary | ICD-10-CM

## 2018-03-16 LAB — COMPREHENSIVE METABOLIC PANEL
ALT: 29 U/L (ref 0–44)
AST: 34 U/L (ref 15–41)
Albumin: 2.2 g/dL — ABNORMAL LOW (ref 3.5–5.0)
Alkaline Phosphatase: 105 U/L (ref 38–126)
Anion gap: 10 (ref 5–15)
BUN: 9 mg/dL (ref 6–20)
CO2: 24 mmol/L (ref 22–32)
Calcium: 8.6 mg/dL — ABNORMAL LOW (ref 8.9–10.3)
Chloride: 101 mmol/L (ref 98–111)
Creatinine, Ser: 0.78 mg/dL (ref 0.61–1.24)
GFR calc Af Amer: >60 mL/min (ref >60)
GFR calc non Af Amer: >60 mL/min (ref >60)
Glucose, Bld: 165 mg/dL — ABNORMAL HIGH (ref 70–99)
Potassium: 3.7 mmol/L (ref 3.5–5.1)
Sodium: 135 mmol/L (ref 135–145)
Total Bilirubin: 0.6 mg/dL (ref 0.3–1.2)
Total Protein: 7.6 g/dL (ref 6.5–8.1)

## 2018-03-16 LAB — RETICULOCYTES
Immature Retic Fract: 12.4 % (ref 2.3–15.9)
RBC.: 4.31 MIL/uL (ref 4.22–5.81)
RETIC COUNT ABSOLUTE: 24.6 10*3/uL (ref 19.0–186.0)
RETIC CT PCT: 0.6 % (ref 0.4–3.1)

## 2018-03-16 LAB — URINALYSIS, ROUTINE W REFLEX MICROSCOPIC
Bacteria, UA: NONE SEEN
Bilirubin Urine: NEGATIVE
Bilirubin Urine: NEGATIVE
Glucose, UA: NEGATIVE mg/dL
Glucose, UA: NEGATIVE mg/dL
Hgb urine dipstick: NEGATIVE
Ketones, ur: NEGATIVE mg/dL
Ketones, ur: NEGATIVE mg/dL
LEUKOCYTE UA: NEGATIVE
Leukocytes,Ua: NEGATIVE
NITRITE: NEGATIVE
Nitrite: NEGATIVE
Protein, ur: NEGATIVE mg/dL
Protein, ur: NEGATIVE mg/dL
Specific Gravity, Urine: 1.005 (ref 1.005–1.030)
Specific Gravity, Urine: 1.042 — ABNORMAL HIGH (ref 1.005–1.030)
pH: 7 (ref 5.0–8.0)
pH: 7 (ref 5.0–8.0)

## 2018-03-16 LAB — CBC
HCT: 30.1 % — ABNORMAL LOW (ref 39.0–52.0)
Hemoglobin: 9.2 g/dL — ABNORMAL LOW (ref 13.0–17.0)
MCH: 22.6 pg — ABNORMAL LOW (ref 26.0–34.0)
MCHC: 30.6 g/dL (ref 30.0–36.0)
MCV: 74 fL — ABNORMAL LOW (ref 80.0–100.0)
NRBC: 0 % (ref 0.0–0.2)
Platelets: 433 10*3/uL — ABNORMAL HIGH (ref 150–400)
RBC: 4.07 MIL/uL — ABNORMAL LOW (ref 4.22–5.81)
RDW: 17.8 % — ABNORMAL HIGH (ref 11.5–15.5)
WBC: 16.3 10*3/uL — ABNORMAL HIGH (ref 4.0–10.5)

## 2018-03-16 LAB — RAPID URINE DRUG SCREEN, HOSP PERFORMED
Amphetamines: NOT DETECTED
BENZODIAZEPINES: NOT DETECTED
Barbiturates: NOT DETECTED
COCAINE: POSITIVE — AB
Opiates: NOT DETECTED
Tetrahydrocannabinol: NOT DETECTED

## 2018-03-16 LAB — IRON AND TIBC
Iron: 12 ug/dL — ABNORMAL LOW (ref 45–182)
Saturation Ratios: 7 % — ABNORMAL LOW (ref 17.9–39.5)
TIBC: 175 ug/dL — ABNORMAL LOW (ref 250–450)
UIBC: 163 ug/dL

## 2018-03-16 LAB — STREP PNEUMONIAE URINARY ANTIGEN: Strep Pneumo Urinary Antigen: NEGATIVE

## 2018-03-16 LAB — LIPASE, BLOOD: Lipase: 42 U/L (ref 11–51)

## 2018-03-16 LAB — CBG MONITORING, ED: Glucose-Capillary: 157 mg/dL — ABNORMAL HIGH (ref 70–99)

## 2018-03-16 LAB — GLUCOSE, CAPILLARY: GLUCOSE-CAPILLARY: 174 mg/dL — AB (ref 70–99)

## 2018-03-16 LAB — FOLATE: Folate: 10.3 ng/mL (ref >5.9)

## 2018-03-16 LAB — MRSA PCR SCREENING: MRSA by PCR: NEGATIVE

## 2018-03-16 LAB — MAGNESIUM: Magnesium: 1.8 mg/dL (ref 1.7–2.4)

## 2018-03-16 LAB — FERRITIN: Ferritin: 256 ng/mL (ref 24–336)

## 2018-03-16 LAB — VITAMIN B12: Vitamin B-12: 559 pg/mL (ref 180–914)

## 2018-03-16 MED ORDER — ONDANSETRON HCL 4 MG/2ML IJ SOLN
4.0000 mg | Freq: Once | INTRAMUSCULAR | Status: AC | PRN
  Administered 2018-03-16: 4 mg via INTRAVENOUS
  Filled 2018-03-16: qty 2

## 2018-03-16 MED ORDER — IPRATROPIUM-ALBUTEROL 0.5-2.5 (3) MG/3ML IN SOLN
3.0000 mL | Freq: Four times a day (QID) | RESPIRATORY_TRACT | Status: DC
  Administered 2018-03-16 – 2018-03-17 (×5): 3 mL via RESPIRATORY_TRACT
  Filled 2018-03-16 (×5): qty 3

## 2018-03-16 MED ORDER — PANTOPRAZOLE SODIUM 40 MG PO TBEC: 40.0000 mg | DELAYED_RELEASE_TABLET | Freq: Every day | ORAL | Status: DC

## 2018-03-16 MED ORDER — INSULIN ASPART 100 UNIT/ML ~~LOC~~ SOLN
0.0000 [IU] | Freq: Three times a day (TID) | SUBCUTANEOUS | Status: DC
  Administered 2018-03-17 (×2): 1 [IU] via SUBCUTANEOUS
  Administered 2018-03-18: 2 [IU] via SUBCUTANEOUS
  Administered 2018-03-19 (×2): 1 [IU] via SUBCUTANEOUS
  Administered 2018-03-19 – 2018-03-20 (×3): 2 [IU] via SUBCUTANEOUS
  Administered 2018-03-20: 1 [IU] via SUBCUTANEOUS
  Administered 2018-03-21 (×2): 2 [IU] via SUBCUTANEOUS
  Administered 2018-03-21: 1 [IU] via SUBCUTANEOUS
  Administered 2018-03-22 (×2): 2 [IU] via SUBCUTANEOUS

## 2018-03-16 MED ORDER — FLUCONAZOLE 100MG IVPB
100.0000 mg | INTRAVENOUS | Status: DC
  Filled 2018-03-16: qty 50

## 2018-03-16 MED ORDER — PROCHLORPERAZINE EDISYLATE 10 MG/2ML IJ SOLN: 10.0000 mg | Freq: Four times a day (QID) | INTRAMUSCULAR | Status: DC | PRN

## 2018-03-16 MED ORDER — NICOTINE 21 MG/24HR TD PT24
21.0000 mg | MEDICATED_PATCH | Freq: Every day | TRANSDERMAL | Status: DC
  Administered 2018-03-17 – 2018-03-22 (×6): 21 mg via TRANSDERMAL
  Filled 2018-03-16 (×6): qty 1

## 2018-03-16 MED ORDER — KETOROLAC TROMETHAMINE 30 MG/ML IJ SOLN
30.0000 mg | Freq: Four times a day (QID) | INTRAMUSCULAR | Status: AC | PRN
  Administered 2018-03-17: 30 mg via INTRAVENOUS
  Filled 2018-03-16 (×2): qty 1

## 2018-03-16 MED ORDER — BUDESONIDE 0.5 MG/2ML IN SUSP
0.5000 mg | Freq: Two times a day (BID) | RESPIRATORY_TRACT | Status: DC
  Administered 2018-03-16 – 2018-03-22 (×12): 0.5 mg via RESPIRATORY_TRACT
  Filled 2018-03-16 (×13): qty 2

## 2018-03-16 MED ORDER — IOHEXOL 300 MG/ML  SOLN
100.0000 mL | Freq: Once | INTRAMUSCULAR | Status: AC | PRN
  Administered 2018-03-16: 100 mL via INTRAVENOUS

## 2018-03-16 MED ORDER — ATORVASTATIN CALCIUM 80 MG PO TABS
80.0000 mg | ORAL_TABLET | Freq: Every day | ORAL | Status: DC
  Administered 2018-03-16 – 2018-03-21 (×6): 80 mg via ORAL
  Filled 2018-03-16 (×6): qty 1

## 2018-03-16 MED ORDER — LINEZOLID 600 MG/300ML IV SOLN
600.0000 mg | Freq: Once | INTRAVENOUS | Status: AC
  Administered 2018-03-16: 600 mg via INTRAVENOUS
  Filled 2018-03-16: qty 300

## 2018-03-16 MED ORDER — OXYCODONE-ACETAMINOPHEN 5-325 MG PO TABS
2.0000 | ORAL_TABLET | Freq: Once | ORAL | Status: AC
  Administered 2018-03-16: 2 via ORAL
  Filled 2018-03-16: qty 2

## 2018-03-16 MED ORDER — IOHEXOL 300 MG/ML  SOLN
75.0000 mL | Freq: Once | INTRAMUSCULAR | Status: AC | PRN
  Administered 2018-03-16: 75 mL via INTRAVENOUS

## 2018-03-16 MED ORDER — MAGNESIUM CITRATE PO SOLN: 1.0000 | Freq: Once | ORAL | Status: DC | PRN

## 2018-03-16 MED ORDER — ENOXAPARIN SODIUM 40 MG/0.4ML ~~LOC~~ SOLN
40.0000 mg | SUBCUTANEOUS | Status: DC
  Administered 2018-03-16 – 2018-03-21 (×6): 40 mg via SUBCUTANEOUS
  Filled 2018-03-16 (×6): qty 0.4

## 2018-03-16 MED ORDER — NYSTATIN 100000 UNIT/ML MT SUSP
5.0000 mL | Freq: Four times a day (QID) | OROMUCOSAL | Status: DC
  Administered 2018-03-16 – 2018-03-22 (×23): 500000 [IU] via ORAL
  Filled 2018-03-16 (×25): qty 5

## 2018-03-16 MED ORDER — SODIUM CHLORIDE 0.9 % IV SOLN
3.0000 g | Freq: Four times a day (QID) | INTRAVENOUS | Status: DC
  Administered 2018-03-16 – 2018-03-22 (×23): 3 g via INTRAVENOUS
  Filled 2018-03-16 (×25): qty 3

## 2018-03-16 MED ORDER — ACETAMINOPHEN 650 MG RE SUPP: 650.0000 mg | Freq: Four times a day (QID) | RECTAL | Status: DC | PRN

## 2018-03-16 MED ORDER — CLOPIDOGREL BISULFATE 75 MG PO TABS
75.0000 mg | ORAL_TABLET | Freq: Every day | ORAL | Status: DC
  Administered 2018-03-16 – 2018-03-22 (×7): 75 mg via ORAL
  Filled 2018-03-16 (×7): qty 1

## 2018-03-16 MED ORDER — SODIUM CHLORIDE 0.9 % IV SOLN
1.0000 g | Freq: Three times a day (TID) | INTRAVENOUS | Status: DC
  Filled 2018-03-16 (×2): qty 1

## 2018-03-16 MED ORDER — FAMOTIDINE 20 MG PO TABS
20.0000 mg | ORAL_TABLET | Freq: Every day | ORAL | Status: DC
  Administered 2018-03-17 – 2018-03-22 (×6): 20 mg via ORAL
  Filled 2018-03-16 (×6): qty 1

## 2018-03-16 MED ORDER — SODIUM CHLORIDE 0.9% FLUSH
3.0000 mL | Freq: Once | INTRAVENOUS | Status: AC
  Administered 2018-03-16: 3 mL via INTRAVENOUS

## 2018-03-16 MED ORDER — SENNOSIDES-DOCUSATE SODIUM 8.6-50 MG PO TABS: 2.0000 | ORAL_TABLET | Freq: Every day | ORAL | Status: DC | PRN

## 2018-03-16 MED ORDER — SODIUM CHLORIDE 0.9 % IV SOLN
INTRAVENOUS | Status: AC
  Administered 2018-03-16 – 2018-03-17 (×2): via INTRAVENOUS

## 2018-03-16 MED ORDER — SODIUM CHLORIDE 0.9 % IV BOLUS
1000.0000 mL | Freq: Once | INTRAVENOUS | Status: AC
  Administered 2018-03-16: 1000 mL via INTRAVENOUS

## 2018-03-16 MED ORDER — SODIUM CHLORIDE 0.9% FLUSH
3.0000 mL | Freq: Two times a day (BID) | INTRAVENOUS | Status: DC
  Administered 2018-03-16 – 2018-03-21 (×3): 3 mL via INTRAVENOUS

## 2018-03-16 MED ORDER — FLUCONAZOLE IN SODIUM CHLORIDE 200-0.9 MG/100ML-% IV SOLN
200.0000 mg | Freq: Once | INTRAVENOUS | Status: DC
  Filled 2018-03-16: qty 100

## 2018-03-16 MED ORDER — GUAIFENESIN ER 600 MG PO TB12
1200.0000 mg | ORAL_TABLET | Freq: Two times a day (BID) | ORAL | Status: DC
  Administered 2018-03-16 – 2018-03-22 (×12): 1200 mg via ORAL
  Filled 2018-03-16 (×12): qty 2

## 2018-03-16 MED ORDER — ASPIRIN 81 MG PO CHEW
81.0000 mg | CHEWABLE_TABLET | Freq: Every day | ORAL | Status: DC
  Administered 2018-03-16 – 2018-03-22 (×7): 81 mg via ORAL
  Filled 2018-03-16 (×7): qty 1

## 2018-03-16 MED ORDER — SODIUM CHLORIDE 0.9 % IV SOLN
2.0000 g | Freq: Once | INTRAVENOUS | Status: AC
  Administered 2018-03-16: 2 g via INTRAVENOUS
  Filled 2018-03-16: qty 2

## 2018-03-16 MED ORDER — PANTOPRAZOLE SODIUM 40 MG PO TBEC
40.0000 mg | DELAYED_RELEASE_TABLET | Freq: Every day | ORAL | Status: DC
  Administered 2018-03-17 – 2018-03-22 (×5): 40 mg via ORAL
  Filled 2018-03-16 (×5): qty 1

## 2018-03-16 MED ORDER — ACETAMINOPHEN 325 MG PO TABS: 650.0000 mg | ORAL_TABLET | Freq: Four times a day (QID) | ORAL | Status: DC | PRN

## 2018-03-16 MED ORDER — SORBITOL 70 % SOLN
30.0000 mL | Freq: Every day | Status: DC | PRN
  Filled 2018-03-16: qty 30

## 2018-03-16 MED ORDER — PREGABALIN 75 MG PO CAPS
75.0000 mg | ORAL_CAPSULE | Freq: Three times a day (TID) | ORAL | Status: DC
  Administered 2018-03-16 – 2018-03-22 (×17): 75 mg via ORAL
  Filled 2018-03-16 (×17): qty 1

## 2018-03-16 MED ORDER — FLUTICASONE PROPIONATE 50 MCG/ACT NA SUSP
2.0000 | Freq: Every day | NASAL | Status: DC
  Administered 2018-03-16 – 2018-03-22 (×7): 2 via NASAL
  Filled 2018-03-16: qty 16

## 2018-03-16 MED ORDER — LINEZOLID 600 MG/300ML IV SOLN
600.0000 mg | Freq: Two times a day (BID) | INTRAVENOUS | Status: DC
  Administered 2018-03-17: 600 mg via INTRAVENOUS
  Filled 2018-03-16: qty 300

## 2018-03-16 MED ORDER — HYDROXYZINE HCL 25 MG PO TABS: 25.0000 mg | ORAL_TABLET | Freq: Three times a day (TID) | ORAL | Status: DC | PRN

## 2018-03-16 MED ORDER — PROCHLORPERAZINE EDISYLATE 10 MG/2ML IJ SOLN
10.0000 mg | Freq: Four times a day (QID) | INTRAMUSCULAR | Status: DC | PRN
  Administered 2018-03-17 – 2018-03-20 (×4): 10 mg via INTRAVENOUS
  Filled 2018-03-16 (×4): qty 2

## 2018-03-16 MED ORDER — POLYETHYLENE GLYCOL 3350 17 G PO PACK
17.0000 g | PACK | Freq: Every day | ORAL | Status: DC
  Administered 2018-03-16 – 2018-03-22 (×5): 17 g via ORAL
  Filled 2018-03-16 (×7): qty 1

## 2018-03-16 MED ORDER — TRAMADOL HCL 50 MG PO TABS
50.0000 mg | ORAL_TABLET | Freq: Three times a day (TID) | ORAL | Status: DC | PRN
  Administered 2018-03-16: 50 mg via ORAL
  Filled 2018-03-16: qty 1

## 2018-03-16 MED ORDER — OXYCODONE-ACETAMINOPHEN 5-325 MG PO TABS
1.0000 | ORAL_TABLET | Freq: Four times a day (QID) | ORAL | Status: DC | PRN
  Administered 2018-03-17 – 2018-03-19 (×8): 2 via ORAL
  Administered 2018-03-19: 1 via ORAL
  Administered 2018-03-19 – 2018-03-21 (×7): 2 via ORAL
  Administered 2018-03-21: 1 via ORAL
  Administered 2018-03-22 (×3): 2 via ORAL
  Filled 2018-03-16 (×10): qty 2
  Filled 2018-03-16: qty 1
  Filled 2018-03-16 (×8): qty 2
  Filled 2018-03-16: qty 1
  Filled 2018-03-16: qty 2

## 2018-03-16 NOTE — Progress Notes (Signed)
[name] is a 58 y.o. male patient admitted from ED awake, alert - oriented  X 4 - no acute distress noted.  VSS - Blood pressure 137/75, pulse 100, temperature 98.1 F (36.7 C), temperature source Oral, resp. rate 18, height 6\' 1"  (1.854 m), weight 63.5 kg, SpO2 98 %.    IV in place, occlusive dsg intact without redness.  Orientation to room, and floor completed with information packet given to patient/family. Admission INP armband ID verified with patient/family, and in place.   Fall assessment complete, with patient and family able to verbalize understanding of risk associated with falls, and verbalized understanding to call nsg before up out of bed.  Call light within reach, patient able to voice, and demonstrate understanding.  Skin, clean-dry- intact without evidence of bruising, or skin tears.   No evidence of skin break down noted on exam.     Will cont to eval and treat per MD orders.  Patience Musca, RN 03/16/2018 6:26 PM

## 2018-03-16 NOTE — H&P (Signed)
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 findings of prior radiograph (series 3, image 1). Findings are concerning for infection or aspiration, particularly necrotizing infection given the appearance. Underlying mass is not excluded and at minimum, follow-up CT  or radiographs are warranted at 6-8 weeks to document complete resolution. 2.  Splenomegaly. 3. 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 Recommend vascular evaluation. Electronically Signed   By: Eddie Candle M.D.   On: 03/16/2018 11:56    EKG: Not done  Assessment/Plan Principal Problem:   Necrotizing pneumonia [facility]) Active Problems:   Thrush, oral   Type 2 diabetes mellitus (HCC)   Dyslipidemia   Anxiety state   SLEEP APNEA, OBSTRUCTIVE, MODERATE   HYPERTENSION, BENIGN ESSENTIAL   Peripheral vascular disease (HCC)   COPD (chronic obstructive pulmonary disease) (HCC)   Gastroesophageal reflux disease   Chronic pain in left foot   Tobacco abuse   Protein-calorie malnutrition, severe (HCC)   History of pulmonary embolism   Polysubstance abuse (HCC)   Microcytic anemia    Diabetes mellitus type 2 in nonobese (HCC)   Acute dehydration  #1 necrotizing pneumonia Patient presenting with a one-month history of nausea vomiting, subjective fevers, chills, shortness of breath, productive cough of greenish sputum, generalized weakness.  Patient treated with doxycycline approximately 2 weeks ago finished course and symptoms returned.  CT chest which was done concerning for a 6.1 x 4.2 cm masslike area of consolidation in the posterior aspect of the left lower lobe consistent with a necrotizing pneumonia.  Mucus plugging involving multiple left lower lobe bronchi noted.  Patient with poor dentition.  Patient with history of polysubstance abuse.  Patient with nausea and vomiting.  Patient noted to have oral thrush.  Check HIV.  Check a urine Legionella antigen.  Check a urine pneumococcus antigen.  Check a sputum Gram stain and culture.  Check blood cultures x2.  Place on Zyvox and IV Unasyn.  Placed on scheduled nebs, Pulmicort, Mucinex.  Case discussed with ID.  ID will see patient in formal consultation.  2.  Oral thrush Check HIV.  Placed on IV fluconazole and nystatin swish and swallow.  3.  Gastroesophageal reflux disease PPI.  4.  Dehydration Secondary to nausea vomiting and insensible losses with subjective fevers and chills.  Placed on IV fluids.  Follow.  5.  Anemia Patient with no overt bleeding.  Check an anemia panel.  Follow H&H.  Transfusion threshold hemoglobin less than 7.  5.  Diabetes mellitus type 2 Check a hemoglobin A1c.  Place on sliding scale insulin.  6.  Chronic pain Resume home pain regimen.  7.  COPD Stable.  Placed on Pulmicort, scheduled duo nebs.  Tobacco cessation.  8.  Tobacco abuse Tobacco cessation.  Placed on a nicotine patch.  9.  Polysubstance abuse Polysubstance cessation stressed to patient.  Check a UDS.  10.  Severe protein calorie malnutrition Nutritional supplementation.  11.  Obstructive sleep apnea CPAP  nightly.  12.  Nausea vomiting Likely secondary to problem #1.  Placed on clear liquids for now.  Supportive care.  Antiemetics.  13.  History of CVA Continue aspirin and Plavix.  DVT prophylaxis: Lovenox Code Status: Full Family Communication: Updated patient.  No family present. Disposition Plan: To be determined. Consults called: Infectious disease to be seen in the morning. Admission status: Admit to inpatient.  MedSurg.   Irine Seal MD [facility]  If 7PM-7AM, please contact night-coverage [email or web address]  03/16/2018, 5:25 PM

## 2018-03-16 NOTE — ED Provider Notes (Signed)
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 studies, ordering and review of radiographic studies, pulse oximetry, re-evaluation of patient's condition, obtaining history from patient or surrogate and review of old charts   (including critical care time)  Medications Ordered in ED Medications  ceFEPIme (MAXIPIME) 2 g in sodium chloride 0.9 % 100 mL IVPB (2 g Intravenous New Bag/Given 03/16/18 1320)  linezolid (ZYVOX) IVPB 600 mg (has no administration in time range)  sodium chloride flush (NS) 0.9 % injection 3 mL (3 mLs Intravenous Given 03/16/18 0919)  ondansetron (ZOFRAN) injection 4 mg (4 mg Intravenous Given 03/16/18 0930)  sodium chloride 0.9 % bolus 1,000 mL (0 mLs Intravenous Stopped 03/16/18 1143)  iohexol (OMNIPAQUE) 300 MG/ML solution 100 mL (100 mLs Intravenous Contrast Given 03/16/18 1107)  oxyCODONE-acetaminophen (PERCOCET/ROXICET) 5-325 MG per tablet 2 tablet (2 tablets Oral Given 03/16/18 1309)     Initial Impression / Assessment and Plan / ED Course  I have reviewed the triage vital signs and the nursing notes.  Pertinent labs & imaging results that were available during my care of the patient were reviewed by me and considered in my medical decision making (see chart for details).        Generalized abdominal tenderness.  Nausea vomiting.  No obvious pathology found within his  peritoneum as the cause of his symptoms however he is found to have a large consolidated infiltrate versus abscess in the left lower lobe.  This will be further defined with CT imaging of his chest.  Broad-spectrum antibiotics now.  4:21 PM Patient with evidence of necrotizing pneumonia.  Patient has been given broad-spectrum antibiotics.  Linezolid and cefepime.  Final Clinical Impressions(s) / ED Diagnoses   Final diagnoses:  Necrotizing pneumonia [facility])    ED Discharge Orders    None       Jola Schmidt, MD 03/16/18 1727

## 2018-03-16 NOTE — ED Notes (Signed)
Abx paused to obtain blood culture #2.

## 2018-03-16 NOTE — ED Triage Notes (Addendum)
na

## 2018-03-16 NOTE — Progress Notes (Signed)
Pharmacy Antibiotic Note  [name] is a 58 y.o. male admitted on 03/16/2018 with necrotizing pneumonia. Pharmacy has been consulted for Unasyn dosing. WBC elevated at 16.3, Scr stable at 0.78, patient currently afebrile.     Plan: Start Unasyn 3g IV q6h Monitor CBC, Scr, clinical progression F/u C&S, de-escalation plans, and LOT  Height: 6\' 1"  (185.4 cm) Weight: 140 lb (63.5 kg) IBW/kg (Calculated) : 79.9  Temp (24hrs), Avg:98.1 F (36.7 C), Min:98.1 F (36.7 C), Max:98.1 F (36.7 C)  Recent Labs  Lab 03/16/18 0918  WBC 16.3*  CREATININE 0.78    Estimated Creatinine Clearance: 89.3 mL/min (by C-G formula based on SCr of 0.78 mg/dL).    Allergies  Allergen Reactions  . Buprenorphine Hcl-Naloxone Hcl Shortness Of Breath and Other (See Comments)    "Felt like I was going to die," was  jittery, had trouble breathing, felt hot (reaction to Suboxone) PER THE NCCSRS database, the patient received #42 Suboxone films between 03/26/17 and 04/02/17 from [facility]...(??)  . Ciprofloxacin Anaphylaxis  . Morphine-Naltrexone Other (See Comments)    Seizures   . Shellfish-Derived Products Anaphylaxis, Hives and Swelling  . Benadryl [Diphenhydramine Hcl] Hives, Itching and Rash  . Fish Allergy Hives, Swelling and Rash  . Morphine And Related Other (See Comments)    Seizures  . Benadryl [Diphenhydramine]   . Ciprofloxacin   . Fish Allergy   . Morphine And Related   . Shellfish Allergy   . Suboxone [Buprenorphine Hcl-Naloxone Hcl]   . Vancomycin   . Vicodin [Hydrocodone-Acetaminophen]   . Vancomycin Rash  . Vicodin [Hydrocodone-Acetaminophen] Nausea Only    Antimicrobials this admission: Unasyn 3/4 >>  Fluconazole 3/4 >>  Linezolid 3/4 >> Cefepime 3/4 x 1 dose  Dose adjustments this admission: N/A  Microbiology results: 3/4 BCx:  3/4 UCx:   3/4 Sputum:   3/4 MRSA PCR:   Thank you for allowing pharmacy to be a part of this patient's care.  Leron Croak, PharmD PGY1 Pharmacy Resident  Please check AMION for all [facility] Pharmacy phone numbers 03/16/2018 5:19 PM

## 2018-03-16 NOTE — Progress Notes (Signed)
Pt states he should wear PAP hs. He currently does not have a machine and hasn't worn one in atleast 3 years. Pt offered machine while in hospital to which he refused at this time. Risks and benefits discussed with pt and pt verbalizes understanding.

## 2018-03-16 NOTE — ED Notes (Signed)
Patient transported to CT 

## 2018-03-16 NOTE — ED Triage Notes (Signed)
Pt reports that he is not getting any better since the last time he was seen at the hospital on 03/02/2018. Pt reports N/V and just a feeling of being horrible. Pt states he has not followed up with his PCP. Pt reports vomiting 4 times in 24 hours and no diarrhea.

## 2018-03-17 ENCOUNTER — Encounter (HOSPITAL_COMMUNITY): Payer: Self-pay | Admitting: General Practice

## 2018-03-17 DIAGNOSIS — J850 Gangrene and necrosis of lung: Principal | ICD-10-CM

## 2018-03-17 DIAGNOSIS — G4730 Sleep apnea, unspecified: Secondary | ICD-10-CM

## 2018-03-17 DIAGNOSIS — F17210 Nicotine dependence, cigarettes, uncomplicated: Secondary | ICD-10-CM

## 2018-03-17 DIAGNOSIS — K0889 Other specified disorders of teeth and supporting structures: Secondary | ICD-10-CM

## 2018-03-17 DIAGNOSIS — Z888 Allergy status to other drugs, medicaments and biological substances status: Secondary | ICD-10-CM

## 2018-03-17 DIAGNOSIS — J449 Chronic obstructive pulmonary disease, unspecified: Secondary | ICD-10-CM

## 2018-03-17 DIAGNOSIS — Z91013 Allergy to seafood: Secondary | ICD-10-CM

## 2018-03-17 DIAGNOSIS — Z885 Allergy status to narcotic agent status: Secondary | ICD-10-CM

## 2018-03-17 DIAGNOSIS — R109 Unspecified abdominal pain: Secondary | ICD-10-CM

## 2018-03-17 DIAGNOSIS — Z881 Allergy status to other antibiotic agents status: Secondary | ICD-10-CM

## 2018-03-17 DIAGNOSIS — B370 Candidal stomatitis: Secondary | ICD-10-CM

## 2018-03-17 DIAGNOSIS — F1490 Cocaine use, unspecified, uncomplicated: Secondary | ICD-10-CM

## 2018-03-17 DIAGNOSIS — Z8614 Personal history of Methicillin resistant Staphylococcus aureus infection: Secondary | ICD-10-CM

## 2018-03-17 DIAGNOSIS — E1151 Type 2 diabetes mellitus with diabetic peripheral angiopathy without gangrene: Secondary | ICD-10-CM

## 2018-03-17 LAB — COMPREHENSIVE METABOLIC PANEL
ALBUMIN: 1.8 g/dL — AB (ref 3.5–5.0)
ALT: 23 U/L (ref 0–44)
AST: 23 U/L (ref 15–41)
Alkaline Phosphatase: 89 U/L (ref 38–126)
Anion gap: 12 (ref 5–15)
BILIRUBIN TOTAL: 0.3 mg/dL (ref 0.3–1.2)
BUN: 8 mg/dL (ref 6–20)
CO2: 20 mmol/L — ABNORMAL LOW (ref 22–32)
Calcium: 7.9 mg/dL — ABNORMAL LOW (ref 8.9–10.3)
Chloride: 103 mmol/L (ref 98–111)
Creatinine, Ser: 0.75 mg/dL (ref 0.61–1.24)
GFR calc Af Amer: >60 mL/min (ref >60)
GFR calc non Af Amer: >60 mL/min (ref >60)
Glucose, Bld: 133 mg/dL — ABNORMAL HIGH (ref 70–99)
Potassium: 3.7 mmol/L (ref 3.5–5.1)
Sodium: 135 mmol/L (ref 135–145)
Total Protein: 6.5 g/dL (ref 6.5–8.1)

## 2018-03-17 LAB — CBC WITH DIFFERENTIAL/PLATELET
Abs Immature Granulocytes: 0.13 10*3/uL — ABNORMAL HIGH (ref 0.00–0.07)
Basophils Absolute: 0.1 10*3/uL (ref 0.0–0.1)
Basophils Relative: 0 %
Eosinophils Absolute: 0.1 10*3/uL (ref 0.0–0.5)
Eosinophils Relative: 1 %
HCT: 26.3 % — ABNORMAL LOW (ref 39.0–52.0)
Hemoglobin: 7.8 g/dL — ABNORMAL LOW (ref 13.0–17.0)
Immature Granulocytes: 1 %
Lymphocytes Relative: 13 %
Lymphs Abs: 1.7 10*3/uL (ref 0.7–4.0)
MCH: 21.9 pg — ABNORMAL LOW (ref 26.0–34.0)
MCHC: 29.7 g/dL — ABNORMAL LOW (ref 30.0–36.0)
MCV: 73.9 fL — ABNORMAL LOW (ref 80.0–100.0)
Monocytes Absolute: 1 10*3/uL (ref 0.1–1.0)
Monocytes Relative: 7 %
Neutro Abs: 10.6 10*3/uL — ABNORMAL HIGH (ref 1.7–7.7)
Neutrophils Relative %: 78 %
Platelets: 337 10*3/uL (ref 150–400)
RBC: 3.56 MIL/uL — ABNORMAL LOW (ref 4.22–5.81)
RDW: 17.9 % — AB (ref 11.5–15.5)
WBC: 13.6 10*3/uL — ABNORMAL HIGH (ref 4.0–10.5)
nRBC: 0 % (ref 0.0–0.2)

## 2018-03-17 LAB — GLUCOSE, CAPILLARY
GLUCOSE-CAPILLARY: 140 mg/dL — AB (ref 70–99)
Glucose-Capillary: 143 mg/dL — ABNORMAL HIGH (ref 70–99)
Glucose-Capillary: 133 mg/dL — ABNORMAL HIGH (ref 70–99)
Glucose-Capillary: 113 mg/dL — ABNORMAL HIGH (ref 70–99)

## 2018-03-17 LAB — IRON AND TIBC
Iron: 7 ug/dL — ABNORMAL LOW (ref 45–182)
Saturation Ratios: 5 % — ABNORMAL LOW (ref 17.9–39.5)
TIBC: 141 ug/dL — ABNORMAL LOW (ref 250–450)
UIBC: 134 ug/dL

## 2018-03-17 LAB — LEGIONELLA PNEUMOPHILA SEROGP 1 UR AG: L. pneumophila Serogp 1 Ur Ag: NEGATIVE

## 2018-03-17 LAB — HIV ANTIBODY (ROUTINE TESTING W REFLEX): HIV Screen 4th Generation wRfx: NONREACTIVE

## 2018-03-17 LAB — PHOSPHORUS: Phosphorus: 4.3 mg/dL (ref 2.5–4.6)

## 2018-03-17 LAB — VITAMIN B12: Vitamin B-12: 449 pg/mL (ref 180–914)

## 2018-03-17 LAB — URINE CULTURE: CULTURE: NO GROWTH

## 2018-03-17 LAB — FERRITIN: Ferritin: 209 ng/mL (ref 24–336)

## 2018-03-17 LAB — HEMOGLOBIN A1C
Hgb A1c MFr Bld: 7.3 % — ABNORMAL HIGH (ref 4.8–5.6)
Mean Plasma Glucose: 162.81 mg/dL

## 2018-03-17 LAB — FOLATE: Folate: 11.8 ng/mL (ref >5.9)

## 2018-03-17 MED ORDER — ADULT MULTIVITAMIN W/MINERALS CH
1.0000 | ORAL_TABLET | Freq: Every day | ORAL | Status: DC
  Administered 2018-03-17 – 2018-03-22 (×6): 1 via ORAL
  Filled 2018-03-17 (×6): qty 1

## 2018-03-17 MED ORDER — MIDODRINE HCL 5 MG PO TABS
5.0000 mg | ORAL_TABLET | Freq: Once | ORAL | Status: AC
  Administered 2018-03-17: 5 mg via ORAL
  Filled 2018-03-17: qty 1

## 2018-03-17 MED ORDER — IPRATROPIUM-ALBUTEROL 0.5-2.5 (3) MG/3ML IN SOLN
3.0000 mL | Freq: Three times a day (TID) | RESPIRATORY_TRACT | Status: DC
  Administered 2018-03-18 – 2018-03-19 (×4): 3 mL via RESPIRATORY_TRACT
  Filled 2018-03-17 (×4): qty 3

## 2018-03-17 MED ORDER — ENSURE ENLIVE PO LIQD
237.0000 mL | Freq: Three times a day (TID) | ORAL | Status: DC
  Administered 2018-03-17 – 2018-03-22 (×15): 237 mL via ORAL

## 2018-03-17 MED ORDER — SODIUM CHLORIDE 0.9 % IV BOLUS
500.0000 mL | Freq: Once | INTRAVENOUS | Status: AC
  Administered 2018-03-17: 500 mL via INTRAVENOUS

## 2018-03-17 NOTE — Progress Notes (Addendum)
Initial Nutrition Assessment  DOCUMENTATION CODES:   Non-severe (moderate) malnutrition in context of chronic illness  INTERVENTION:  - Ensure Enlive po TID, each supplement provides 350 kcal and 20 grams of protein  -MVI daily  Continue to monitor PO intake.   NUTRITION DIAGNOSIS:   Moderate Malnutrition related to chronic illness(CHF/ polysubstance abuse) as evidenced by moderate fat depletion, mild muscle depletion, moderate muscle depletion.  GOAL:   Patient will meet greater than or equal to 90% of their needs  MONITOR:   PO intake, Supplement acceptance  REASON FOR ASSESSMENT:   Other (Comment)(Low BMI)    ASSESSMENT:   58 year old male w/ PMH of T2DM, hepatitis C, PVD, HTN, HLD, GERD, CHF and an AKA of the right leg. Previous ED visit for CAP on 2/15. Pt presented to ED 3/4 after not feeling any better since last visit. Now diagnosed with necrotizing pneumonia.   Spoke with pt at bedside. Pt was falling asleep during conversation so he was a poor historian.  Pt reports that he normally has 1-2 meals a day. He eats softer food items and mentioned hamburger, sandwiches, and soft fruits. Currently he has a good appetite and is looking forward to eating. Now on a full liquid diet. Pt was eating an [place] ice cream during conversation.   Pt reports UBW of 148#, unable to recall if he has lost wt recently. NFPE did reveal mild to moderate depletions. Pt reports that he uses a wheel chair to get around. Per chart pt has experienced a 14% wt loss in 2 weeks which is significant for the time frame.   Pt is amendable to drinking Ensure TID, he prefers chocolate or strawberry.   Medications reviewed and include: Pepcid, Insulin aspart 0-9 units, Protonix 40mg , miralax, IV NaCl 49ml/hr Labs reviewed: CBG (157, 174, 143)  NUTRITION - FOCUSED PHYSICAL EXAM:    Most Recent Value  Orbital Region  Moderate depletion  Upper Arm Region  No depletion  Thoracic and Lumbar Region   Moderate depletion  Buccal Region  Moderate depletion  Temple Region  Moderate depletion  Clavicle Bone Region  Mild depletion  Clavicle and Acromion Bone Region  Moderate depletion  Scapular Bone Region  Moderate depletion  Dorsal Hand  Mild depletion  Patellar Region  Moderate depletion [Uses wheelchair]  Anterior Thigh Region  Moderate depletion  Posterior Calf Region  Moderate depletion  Edema (RD Assessment)  None  Hair  Reviewed  Eyes  Reviewed  Mouth  Reviewed Ritta Slot)  Skin  Reviewed  Nails  Reviewed       Diet Order:   Diet Order            Diet full liquid Room service appropriate? Yes with Assist; Fluid consistency: Thin  Diet effective now              EDUCATION NEEDS:   No education needs have been identified at this time  Skin:  Skin Assessment: Reviewed RN Assessment  Last BM:  unknown/ PTA  Height:   Ht Readings from Last 1 Encounters:  03/16/18 6\' 1"  (1.854 m)    Weight:   Wt Readings from Last 1 Encounters:  03/17/18 59.1 kg    Ideal Body Weight:  76.9 kg (AKA right leg)  BMI:  Body mass index is 18.7 kg/m. (AKA right leg)  Estimated Nutritional Needs:   Kcal:  2050-2250  Protein:  95-110 g  Fluid:  >/= 2.2L    [place] Intern

## 2018-03-17 NOTE — Progress Notes (Signed)
Pt continue to refuse to wear CPAP while in the hospital.  No distress noted.
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
## 2018-03-17 NOTE — Evaluation (Signed)
Physical Therapy Evaluation Patient Details Name: [name] MRN: [id number] DOB: 1960/06/26 Today's Date: 03/17/2018   History of Present Illness  Patient is a 58 y.o. 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  Clinical Impression  Pt admitted with/for the above problems.  Work up including left lung necrotizing pneumonia..  Pt currently limited functionally due to the problems listed. ( See problems list.)   Pt will benefit from PT to maximize function and safety in order to get ready for next venue listed below.     Follow Up Recommendations SNF;Supervision/Assistance - 24 hour    Equipment Recommendations  Other (comment)(TBA next venue)    Recommendations for Other Services       Precautions / Restrictions Precautions Precautions: Fall Restrictions Weight Bearing Restrictions: Yes LLE Weight Bearing: Non weight bearing(await corroboration for WBAT)      Mobility  Bed Mobility Overal bed mobility: Needs Assistance Bed Mobility: Supine to Sit;Sit to Supine     Supine to sit: Min assist Sit to supine: Mod assist   General bed mobility comments: cues for best technique.  Pt using UE's well to boost around in bed, but LE's are painful and weak enough to need assist back into bed.  Transfers Overall transfer level: Needs assistance   Transfers: Lateral/Scoot Transfers          Lateral/Scoot Transfers: Min guard    Ambulation/Gait                Stairs            Wheelchair Mobility    Modified Rankin (Stroke Patients Only)       Balance Overall balance assessment: Needs assistance Sitting-balance support: No upper extremity supported;Feet unsupported Sitting balance-Leahy Scale:  Fair Sitting balance - Comments: accepts minimal perturbation before he loses balance                                     Pertinent Vitals/Pain      Home Living Family/patient expects to be discharged to:: Private residence Living Arrangements: Other relatives(brother) Available Help at Discharge: Personal care attendant;Family;Available PRN/intermittently Type of Home: Apartment Home Access: Stairs to enter   [employer] of Steps: 1(stoop) Home Layout: One level Home Equipment: Wheelchair - [employer] - 2 wheels;Tub bench;Other (comment)(drop arm commode, transfer board) Additional Comments: reports he has been allowed to put weight on his L LE x 1 month per Dr Lorin Mercy    Prior Function Level of [place]: Needs assistance   Gait / Transfers Assistance Needed: was using a w/c and transfer board  ADL's / Homemaking Assistance Needed: aide helps with bathing, dressing, meals and housekeeping        Hand Dominance   Dominant Hand: Right    Extremity/Trunk Assessment        Lower Extremity Assessment Lower Extremity Assessment: RLE deficits/detail;LLE deficits/detail RLE Deficits / Details: NT LLE Deficits / Details: painful, moves against gravity.       Communication   Communication: No difficulties  Cognition Arousal/Alertness: Awake/alert;Lethargic Behavior During Therapy: WFL for tasks assessed/performed Overall Cognitive Status: Within Functional Limits for tasks assessed  General Comments      Exercises     Assessment/Plan    PT Assessment Patient needs continued PT services  PT Problem List Decreased strength;Decreased range of motion;Decreased activity tolerance;Decreased balance;Decreased mobility;Pain;Decreased knowledge of use of DME       PT Treatment Interventions Functional mobility training;DME instruction;Therapeutic activities;Therapeutic exercise;Balance  training;Patient/family education    PT Goals (Current goals can be found in the Care Plan section)  Acute Rehab PT Goals Patient Stated Goal: Feeling better, getting my prosthesis PT Goal Formulation: With patient Time For Goal Achievement: 03/31/18 Potential to Achieve Goals: Fair    Frequency Min 3X/week   Barriers to discharge        Co-evaluation PT/OT/SLP Co-Evaluation/Treatment: Yes Reason for Co-Treatment: Complexity of the patient's impairments (multi-system involvement);For patient/therapist safety PT goals addressed during session: Mobility/safety with mobility OT goals addressed during session: ADL's and self-care       AM-PAC PT "6 Clicks" Mobility  Outcome Measure Help needed turning from your back to your side while in a flat bed without using bedrails?: A Lot Help needed moving from lying on your back to sitting on the side of a flat bed without using bedrails?: A Lot Help needed moving to and from a bed to a chair (including a wheelchair)?: Total Help needed standing up from a chair using your arms (e.g., wheelchair or bedside chair)?: Total Help needed to walk in hospital room?: Total Help needed climbing 3-5 steps with a railing? : Total 6 Click Score: 8    End of Session   Activity Tolerance: Patient limited by pain;Other (comment)(limited by soft BP) Patient left: in bed;with call bell/phone within reach;with bed alarm set Nurse Communication: Mobility status PT Visit Diagnosis: Other abnormalities of gait and mobility (R26.89);Muscle weakness (generalized) (M62.81);Pain Pain - Right/Left: Left Pain - part of body: Leg    Time: 9163-8466 PT Time Calculation (min) (ACUTE ONLY): 15 min   Charges:   PT Evaluation $PT Eval Moderate Complexity: 1 Mod          03/17/2018  Donnella Sham, PT Acute Rehabilitation Services [phone]  (pager) [phone]  (office)  Tessie Fass Charleton Deyoung 03/17/2018, 3:51 PM

## 2018-03-17 NOTE — Evaluation (Signed)
Occupational Therapy Evaluation Patient Details Name: [name] MRN: [id number] DOB: 08-Nov-1960 Today's Date: 03/17/2018    History of Present Illness Patient is a 58 y.o. 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   Clinical Impression   Pt was assisted by his aide for ADL and IADL and mobilized using a w/c with transfer board. He desires to ambulate again and reports Dr. Lorin Mercy told him he could put weight on his L LE, awaiting confirmation of WB status. Limited evaluation due to hypotension. Pt reporting L hip pain. Will follow acutely, recommending SNF for continued rehab, pt is in agreement.    Follow Up Recommendations  SNF;Supervision/Assistance - 24 hour    Equipment Recommendations  None recommended by OT    Recommendations for Other Services       Precautions / Restrictions Precautions Precautions: Fall Restrictions Weight Bearing Restrictions: Yes LLE Weight Bearing: Non weight bearing(await corraboration for WB status)      Mobility Bed Mobility Overal bed mobility: Needs Assistance Bed Mobility: Supine to Sit;Sit to Supine     Supine to sit: Min assist Sit to supine: Mod assist   General bed mobility comments: cues for best technique.  Pt using UE's well to boost around in bed, but LE's are painful and weak enough to need assist back into bed.  Transfers Overall transfer level: Needs assistance   Transfers: Lateral/Scoot Transfers          Lateral/Scoot Transfers: Min guard General transfer comment: scooted along EOB    Balance Overall balance assessment: Needs assistance Sitting-balance support: No upper extremity supported;Feet unsupported Sitting balance-Leahy Scale: Fair Sitting  balance - Comments: accepts minimal perturbation before he loses balance                                   ADL either performed or assessed with clinical judgement   ADL Overall ADL's : Needs assistance/impaired Eating/Feeding: Set up;Bed level   Grooming: Wash/dry face;Bed level;Set up   Upper Body Bathing: Minimal assistance;Sitting   Lower Body Bathing: Maximal assistance;Bed level   Upper Body Dressing : Minimal assistance;Sitting   Lower Body Dressing: Maximal assistance;Sitting/lateral leans                 General ADL Comments: Pt with hypotension, limited session to EOB. Changed bed linens.     Vision Patient Visual Report: No change from baseline       Perception     Praxis      Pertinent Vitals/Pain Pain Assessment: Faces Faces Pain Scale: Hurts even more Pain Location: L hip Pain Descriptors / Indicators: Aching Pain Intervention(s): Monitored during session;Patient requesting pain meds-RN notified;Repositioned     Hand Dominance Right   Extremity/Trunk Assessment Upper Extremity Assessment Upper Extremity Assessment: Overall WFL for tasks assessed   Lower Extremity Assessment Lower Extremity Assessment: Defer to PT evaluation RLE Deficits / Details: NT LLE Deficits / Details: painful, moves against gravity.   Cervical / Trunk Assessment Cervical / Trunk Assessment: Kyphotic   Communication Communication Communication: No difficulties   Cognition Arousal/Alertness: Awake/alert;Lethargic(become more alert when stimulated) Behavior During Therapy: WFL for tasks assessed/performed Overall Cognitive Status: Within Functional Limits for tasks assessed  General Comments: pt able to offer detailed information about past medical hx   General Comments       Exercises     Shoulder Instructions      Home Living Family/patient expects to be discharged to:: Private residence Living  Arrangements: Other relatives(brother) Available Help at Discharge: Personal care attendant;Family;Available PRN/intermittently Type of Home: Apartment Home Access: Stairs to enter [employer] of Steps: 1(stoop)   Home Layout: One level     Bathroom Shower/Tub: Teacher, early years/pre: Standard     Home Equipment: Wheelchair - [employer] - 2 wheels;Tub bench;Other (comment)(drop arm commode, transfer board)   Additional Comments: reports he has been allowed to put weight on his L LE x 1 month per Dr Lorin Mercy      Prior Functioning/Environment Level of Independence: Needs assistance  Gait / Transfers Assistance Needed: was using a w/c and transfer board ADL's / Homemaking Assistance Needed: aide helps with bathing, dressing, meals and housekeeping            OT Problem List: Decreased activity tolerance;Impaired balance (sitting and/or standing);Decreased knowledge of use of DME or AE;Decreased safety awareness;Pain      OT Treatment/Interventions: Self-care/ADL training;DME and/or AE instruction;Patient/family education;Balance training    OT Goals(Current goals can be found in the care plan section) Acute Rehab OT Goals Patient Stated Goal: Feeling better, getting my prosthesis OT Goal Formulation: With patient Time For Goal Achievement: 03/31/18 Potential to Achieve Goals: Good ADL Goals Pt Will Perform Grooming: with set-up;sitting(at sink) Pt Will Perform Upper Body Dressing: sitting;with set-up Pt Will Perform Lower Body Dressing: with min assist;sitting/lateral leans Pt Will Transfer to Toilet: with supervision;with transfer board;bedside commode Pt Will Perform Toileting - Clothing Manipulation and hygiene: with supervision;sitting/lateral leans Additional ADL Goal #1: Pt will perform bed mobility modified independently.  OT Frequency: Min 2X/week   Barriers to D/C: Decreased caregiver support          Co-evaluation PT/OT/SLP  Co-Evaluation/Treatment: Yes Reason for Co-Treatment: Complexity of the patient's impairments (multi-system involvement);For patient/therapist safety PT goals addressed during session: Mobility/safety with mobility OT goals addressed during session: ADL's and self-care      AM-PAC OT "6 Clicks" Daily Activity     Outcome Measure Help from another person eating meals?: None Help from another person taking care of personal grooming?: A Little Help from another person toileting, which includes using toliet, bedpan, or urinal?: A Lot Help from another person bathing (including washing, rinsing, drying)?: A Lot Help from another person to put on and taking off regular upper body clothing?: A Little Help from another person to put on and taking off regular lower body clothing?: A Lot 6 Click Score: 16   End of Session Nurse Communication: Patient requests pain meds  Activity Tolerance: Treatment limited secondary to medical complications (Comment)(hypotensive) Patient left: in bed;with call bell/phone within reach;with bed alarm set  OT Visit Diagnosis: Pain;Muscle weakness (generalized) (M62.81)                Time: 4403-4742 OT Time Calculation (min): 36 min Charges:  OT General Charges $OT Visit: 1 Visit OT Evaluation $OT Eval Moderate Complexity: 1 Mod  Nestor Lewandowsky, OTR/L Acute Rehabilitation Services Pager: [phone] Office: [phone]  Malka So 03/17/2018, 3:59 PM

## 2018-03-17 NOTE — Progress Notes (Signed)
PROGRESS NOTE        PATIENT DETAILS Name: [name] Age: 58 y.o. Sex: male Date of Birth: 1960/02/02 Admit Date: 03/16/2018 Admitting Physician Eugenie Filler, MD FUX:NATF, Ander Gaster, MD  Brief Narrative: Patient is a 58 y.o. 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  See below for further details.  Subjective: Productive cough continues.  Vomited this morning.  Assessment/Plan: Necrotizing pneumonia of left lung: Afebrile-but continues to vomit and have productive cough-leukocytosis decreasing.  Continue with Zyvox and Unasyn.  Await culture data-urine pneumococcal antigen negative.  Await formal evaluation by infectious disease.  Oral thrush: Continue antifungals-follow.  Anemia: Appears to be microcytic-ferritin levels are normal-suspect has anemia of chronic disease-worsened by acute illness.  No evidence of blood loss-follow for now.  Dehydration: Volume status is improved following IV fluids.  Continue to assess volume status daily  DM-2: CBGs relatively stable-continue SSI and follow.  Will optimize accordingly.  COPD/asthma: Some scattered rhonchi but otherwise clear-no evidence of exacerbation-continue bronchodilators.  GERD: Continue PPI  CVA/PAD s/p right AKA: Continue aspirin/Plavix and statin  OSA: CPAP nightly  Severe protein calorie malnutrition: Continue supplements  Tobacco/cocaine use: Counseled-not sure if he has any indication of quitting at this point.  Chronic pain syndrome: Continue Lyrica and as needed tramadol  DVT Prophylaxis: Prophylactic Lovenox   Code Status: Full code   Family Communication: None at bedside  Disposition Plan: Remain  inpatient  Antimicrobial agents: Anti-infectives (From admission, onward)   Start     Dose/Rate Route Frequency Ordered Stop   03/17/18 1700  fluconazole (DIFLUCAN) IVPB 100 mg     100 mg 50 mL/hr over 60 Minutes Intravenous Every 24 hours 03/16/18 1706     03/17/18 0500  linezolid (ZYVOX) IVPB 600 mg     600 mg 300 mL/hr over 60 Minutes Intravenous Every 12 hours 03/16/18 1703     03/16/18 2130  ceFEPIme (MAXIPIME) 1 g in sodium chloride 0.9 % 100 mL IVPB  Status:  Discontinued     1 g 200 mL/hr over 30 Minutes Intravenous Every 8 hours 03/16/18 1626 03/16/18 1705   03/16/18 1930  Ampicillin-Sulbactam (UNASYN) 3 g in sodium chloride 0.9 % 100 mL IVPB     3 g 200 mL/hr over 30 Minutes Intravenous Every 6 hours 03/16/18 1724     03/16/18 1715  fluconazole (DIFLUCAN) IVPB 200 mg     200 mg 100 mL/hr over 60 Minutes Intravenous  Once 03/16/18 1706     03/16/18 1300  ceFEPIme (MAXIPIME) 2 g in sodium chloride 0.9 % 100 mL IVPB     2 g 200 mL/hr over 30 Minutes Intravenous  Once 03/16/18 1235 03/16/18 1408   03/16/18 1300  linezolid (ZYVOX) IVPB 600 mg     600 mg 300 mL/hr over 60 Minutes Intravenous  Once 03/16/18 1255 03/16/18 1821      Procedures: None  CONSULTS:  None  Time spent: 25- minutes-Greater than 50% of this time was spent in counseling, explanation of diagnosis, planning of further management, and coordination of care.  MEDICATIONS: Scheduled Meds: . aspirin  81 mg Oral Daily  .  atorvastatin  80 mg Oral q1800  . budesonide (PULMICORT) nebulizer solution  0.5 mg Nebulization BID  . clopidogrel  75 mg Oral Daily  . enoxaparin (LOVENOX) injection  40 mg Subcutaneous Q24H  . famotidine  20 mg Oral Daily  . fluticasone  2 spray Each Nare Daily  . guaiFENesin  1,200 mg Oral BID  . insulin aspart  0-9 Units Subcutaneous TID WC  . ipratropium-albuterol  3 mL Nebulization Q6H  . nicotine  21 mg Transdermal Daily  . nystatin  5 mL Oral QID  . pantoprazole  40 mg  Oral Q0600  . polyethylene glycol  17 g Oral Daily  . pregabalin  75 mg Oral TID  . sodium chloride flush  3 mL Intravenous Q12H   Continuous Infusions: . sodium chloride 100 mL/hr at 03/16/18 2033  . ampicillin-sulbactam (UNASYN) IV 3 g (03/17/18 0802)  . fluconazole (DIFLUCAN) IV    . fluconazole (DIFLUCAN) IV    . linezolid (ZYVOX) IV 600 mg (03/17/18 0545)   PRN Meds:.acetaminophen **OR** acetaminophen, hydrOXYzine, ketorolac, magnesium citrate, oxyCODONE-acetaminophen, prochlorperazine, senna-docusate, sorbitol, traMADol   PHYSICAL EXAM: Vital signs: Vitals:   03/17/18 0230 03/17/18 0436 03/17/18 0439 03/17/18 0744  BP:  115/60    Pulse:  95  (!) 125  Resp:  16  (!) 22  Temp:  99 F (37.2 C)    TempSrc:  Oral    SpO2: 95% 98%  98%  Weight:   59.1 kg   Height:       Filed Weights   03/16/18 0905 03/17/18 0439  Weight: 63.5 kg 59.1 kg   Body mass index is 17.19 kg/m.   General appearance :Awake, alert, not in any distress Eyes:, pupils equally reactive to light and accomodation,no scleral icterus. HEENT: Atraumatic and Normocephalic Neck: supple Resp:Good air entry bilaterally, rales left more than right, some scattered rhonchi CVS: S1 S2 regular GI: Bowel sounds present, Non tender and not distended with no gaurding, rigidity or rebound.No organomegaly Extremities: B/L Lower Ext shows no edema, both legs are warm to touch Neurology:  speech clear,Non focal, sensation is grossly intact. Musculoskeletal:No digital cyanosis Skin:No Rash, warm and dry Wounds:N/A  I have personally reviewed following labs and imaging studies  LABORATORY DATA: CBC: Recent Labs  Lab 03/16/18 0918 03/17/18 0438  WBC 16.3* 13.6*  NEUTROABS  --  10.6*  HGB 9.2* 7.8*  HCT 30.1* 26.3*  MCV 74.0* 73.9*  PLT 433* 027    Basic Metabolic Panel: Recent Labs  Lab 03/16/18 0918 03/16/18 1857 03/17/18 0438  NA 135  --  135  K 3.7  --  3.7  CL 101  --  103  CO2 24  --  20*   GLUCOSE 165*  --  133*  BUN 9  --  8  CREATININE 0.78  --  0.75  CALCIUM 8.6*  --  7.9*  MG  --  1.8  --   PHOS  --   --  4.3    GFR: Estimated Creatinine Clearance: 83.1 mL/min (by C-G formula based on SCr of 0.75 mg/dL).  Liver Function Tests: Recent Labs  Lab 03/16/18 0918 03/17/18 0438  AST 34 23  ALT 29 23  ALKPHOS 105 89  BILITOT 0.6 0.3  PROT 7.6 6.5  ALBUMIN 2.2* 1.8*   Recent Labs  Lab 03/16/18 0918  LIPASE 42   No results for input(s): AMMONIA in the last 168 hours.  Coagulation Profile: No results for input(s): INR, PROTIME in the  last 168 hours.  Cardiac Enzymes: No results for input(s): CKTOTAL, CKMB, CKMBINDEX, TROPONINI in the last 168 hours.  BNP (last 3 results) No results for input(s): PROBNP in the last 8760 hours.  HbA1C: Recent Labs    03/17/18 0438  HGBA1C 7.3*    CBG: Recent Labs  Lab 03/16/18 0922 03/16/18 2147 03/17/18 0813  GLUCAP 157* 174* 143*    Lipid Profile: No results for input(s): CHOL, HDL, LDLCALC, TRIG, CHOLHDL, LDLDIRECT in the last 72 hours.  Thyroid Function Tests: No results for input(s): TSH, T4TOTAL, FREET4, T3FREE, THYROIDAB in the last 72 hours.  Anemia Panel: Recent Labs    03/16/18 1857 03/17/18 0438  VITAMINB12 559 449  FOLATE 10.3 11.8  FERRITIN 256 209  TIBC 175* 141*  IRON 12* 7*  RETICCTPCT 0.6  --     Urine analysis:    Component Value Date/Time   COLORURINE YELLOW 03/16/2018 1904   APPEARANCEUR CLEAR 03/16/2018 1904   LABSPEC 1.042 (H) 03/16/2018 1904   PHURINE 7.0 03/16/2018 1904   GLUCOSEU NEGATIVE 03/16/2018 1904   GLUCOSEU > 1000 mg/dL (A) 04/06/2008 2035   HGBUR NEGATIVE 03/16/2018 1904   BILIRUBINUR NEGATIVE 03/16/2018 1904   KETONESUR NEGATIVE 03/16/2018 1904   PROTEINUR NEGATIVE 03/16/2018 1904   UROBILINOGEN 1.0 11/21/2014 1600   NITRITE NEGATIVE 03/16/2018 1904   LEUKOCYTESUR NEGATIVE 03/16/2018 1904    Sepsis Labs: Lactic Acid, Venous    Component Value  Date/Time   LATICACIDVEN 1.83 01/21/2018 1854    MICROBIOLOGY: Recent Results (from the past 240 hour(s))  MRSA PCR Screening     Status: None   Collection Time: 03/16/18  5:40 PM  Result Value Ref Range Status   MRSA by PCR NEGATIVE NEGATIVE Final    Comment:        The GeneXpert MRSA Assay (FDA approved for NASAL specimens only), is one component of a comprehensive MRSA colonization surveillance program. It is not intended to diagnose MRSA infection nor to guide or monitor treatment for MRSA infections. Performed at [facility], [place] [address], [place], [place] [address]     RADIOLOGY STUDIES/RESULTS: Dg Chest 2 View  Result Date: 02/26/2018 CLINICAL DATA:  Cough productive of green mucus, shortness of breath, and central chest pain since yesterday, LEFT femur surgery 3 weeks ago, history hypertension, diabetes mellitus, COPD, CHF, asthma, pulmonary embolism, TB exposure EXAM: CHEST - 2 VIEW COMPARISON:  None FINDINGS: Normal heart size, mediastinal contours, and pulmonary vascularity. Atherosclerotic calcification aorta. Emphysematous changes with focal LEFT lower lobe consolidation consistent with pneumonia. Minimal subsegmental atelectasis at RIGHT base. Remaining lungs clear. No pleural effusion or pneumothorax. Multiple old fractures of LEFT ribs and LEFT clavicle. Osseous demineralization. IMPRESSION: COPD changes with LEFT lower lobe pneumonia and mild RIGHT basilar atelectasis. Electronically Signed   By: Lavonia Dana M.D.   On: 02/26/2018 17:15   Ct Chest W Contrast  Result Date: 03/16/2018 CLINICAL DATA:  Mass-like consolidation in the left lower lobe on recent chest radiographs and abdomen and pelvis CT. The patient has had worsening nausea and vomiting and at the time of the chest radiographs had a productive cough, shortness of breath and central chest pain. History of COPD, asthma, pulmonary embolism and TB exposure. The patient is a current smoker, smoking 2 packs  of cigarettes per day with a 104 pack-year history of smoking. EXAM: CT CHEST WITH CONTRAST TECHNIQUE: Multidetector CT imaging of the chest was performed during intravenous contrast administration. CONTRAST:  89mL OMNIPAQUE IOHEXOL 300  MG/ML  SOLN COMPARISON:  Chest radiographs dated 02/26/2018 and abdomen and pelvis CT dated 03/16/2018. FINDINGS: Cardiovascular: Atheromatous calcifications, including the coronary arteries and aorta. Normal sized heart. Mediastinum/Nodes: Multiple enlarged mediastinal and bilateral hilar lymph nodes, greatest in the left hilar region. These include a 12 mm short axis AP window node on image number 62 series 4, 10 mm short axis right anterior paratracheal node on image number 43 series 4, 14 mm short axis subcarinal node on image number 68 series 4, 12 mm short axis right hilar node on image number 70 series 4 and a 17 mm short axis left hilar node on image number 86 series 4. Unremarkable thyroid gland and esophagus. Lungs/Pleura: Patchy and dense, mass-like airspace consolidation in the posterior aspect of the left lower lobe with a prominent oval central low density component measuring 6.1 x 4.2 cm on image number 99 series 4. There is also mucous plugging in multiple left lower lobe bronchi. This is relatively low density with small amount of interspersed air. Mild diffuse peribronchial thickening and mild centrilobular bullous changes. Small amount of bilateral linear atelectasis or scarring. No pleural fluid. Upper Abdomen: Atheromatous arterial calcifications without aneurysm. Mild diffuse low density of the liver relative to the spleen. Musculoskeletal: Old, healed bilateral rib fractures. Thoracic and lower cervical spine degenerative changes. IMPRESSION: 1. 6.1 x 4.2 cm mass-like area of consolidation in the posterior aspect of the left lower lobe with a central low density component. This has an appearance most compatible with necrotizing pneumonia and possible lung  abscess. An underlying cavitary neoplasm is less likely but not excluded. 2. Mucous plugging involving multiple left lower lobe bronchi. 3. Mild mediastinal and bilateral hilar adenopathy, most likely reactive. Metastatic adenopathy is also a possibility. 4.  Calcific coronary artery and aortic atherosclerosis. 5. Mild changes of COPD and chronic bronchitis. 6. Mild diffuse hepatic steatosis. Aortic Atherosclerosis (ICD10-I70.0) and Emphysema (ICD10-J43.9). Electronically Signed   By: Claudie Revering M.D.   On: 03/16/2018 14:54   Ct Abdomen Pelvis W Contrast  Result Date: 03/16/2018 CLINICAL DATA:  Nausea, vomiting, worsening EXAM: CT ABDOMEN AND PELVIS WITH CONTRAST TECHNIQUE: Multidetector CT imaging of the abdomen and pelvis was performed using the standard protocol following bolus administration of intravenous contrast. CONTRAST:  17mL OMNIPAQUE IOHEXOL 300 MG/ML  SOLN COMPARISON:  Chest radiograph 02/26/2018 FINDINGS: Lower chest: There is a partially included very dense, masslike consolidation of the left lower lobe, in keeping with findings of prior radiograph (series 3, image 1). Hepatobiliary: No focal liver abnormality is seen. No gallstones, gallbladder wall thickening, or biliary dilatation. Pancreas: Unremarkable. No pancreatic ductal dilatation or surrounding inflammatory changes. Spleen: Splenomegaly, maximum span approximately 15.9 cm. Adrenals/Urinary Tract: Adrenal glands are unremarkable. Kidneys are normal, without renal calculi, focal lesion, or hydronephrosis. Bladder is unremarkable. Stomach/Bowel: Stomach is within normal limits. Appendix not clearly visualized. No evidence of bowel wall thickening, distention, or inflammatory changes. Large burden of stool in the colon. Vascular/Lymphatic: Severe mixed calcific atherosclerosis of the abdominal aorta and branch vessels, with a left common iliac artery stent, which appears patent. 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 No enlarged abdominal or pelvic lymph nodes. Reproductive: No mass or other abnormality. Other: No abdominal wall hernia or abnormality. No abdominopelvic ascites. Musculoskeletal: No acute or significant osseous findings. IMPRESSION: 1. There is a partially included very dense,  masslike consolidation of the left lower lobe, in keeping with findings of prior radiograph (series 3, image 1). Findings are concerning for infection or aspiration, particularly necrotizing infection given the appearance. Underlying mass is not excluded and at minimum, follow-up CT or radiographs are warranted at 6-8 weeks to document complete resolution. 2.  Splenomegaly. 3. 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 Recommend vascular evaluation. Electronically Signed   By: Eddie Candle M.D.   On: 03/16/2018 11:56   Dg Abd Acute 2+v W 1v Chest  Result Date: 03/02/2018 CLINICAL DATA:  Cough nausea and vomiting EXAM: DG ABDOMEN ACUTE W/ 1V CHEST COMPARISON:  CT 01/21/2018, chest x-ray 01/21/2018 FINDINGS: Single-view chest demonstrates left perihilar airspace disease. Normal heart size. Old left upper rib deformities. Aortic atherosclerosis. Supine and decubitus views of the abdomen demonstrate no free air. Nonobstructed bowel-gas pattern with moderate stool. Left iliac stent. Clips in the right groin. Surgical hardware in the left femur. Faint linear calcifications in the region of the renal hilus, likely reflecting intrarenal vascular calcification. IMPRESSION: 1. Left perihilar airspace disease concerning for a pneumonia 2. Nonobstructed bowel-gas pattern Electronically Signed   By: Donavan Foil M.D.   On: 03/02/2018 23:50     LOS: 1 day   Oren Binet,  MD  [facility]  If 7PM-7AM, please contact night-coverage  Please page via [email or web address]  Go to [email or web address] and use [facility] universal password to access. If you do not have the password, please contact the hospital operator.  Locate the [facility] provider you are looking for under [facility] and page to a number that you can be directly reached. If you still have difficulty reaching the provider, please page the [facility] (Director on Call) for the Hospitalists listed on amion for assistance.  03/17/2018, 9:14 AM

## 2018-03-18 DIAGNOSIS — R918 Other nonspecific abnormal finding of lung field: Secondary | ICD-10-CM

## 2018-03-18 DIAGNOSIS — E440 Moderate protein-calorie malnutrition: Secondary | ICD-10-CM

## 2018-03-18 LAB — CBC
HCT: 30.1 % — ABNORMAL LOW (ref 39.0–52.0)
Hemoglobin: 8.8 g/dL — ABNORMAL LOW (ref 13.0–17.0)
MCH: 21.9 pg — ABNORMAL LOW (ref 26.0–34.0)
MCHC: 29.2 g/dL — ABNORMAL LOW (ref 30.0–36.0)
MCV: 75.1 fL — AB (ref 80.0–100.0)
Platelets: 379 10*3/uL (ref 150–400)
RBC: 4.01 MIL/uL — ABNORMAL LOW (ref 4.22–5.81)
RDW: 17.9 % — ABNORMAL HIGH (ref 11.5–15.5)
WBC: 10.3 10*3/uL (ref 4.0–10.5)
nRBC: 0 % (ref 0.0–0.2)

## 2018-03-18 LAB — BASIC METABOLIC PANEL
Anion gap: 10 (ref 5–15)
BUN: 11 mg/dL (ref 6–20)
CO2: 23 mmol/L (ref 22–32)
Calcium: 8 mg/dL — ABNORMAL LOW (ref 8.9–10.3)
Chloride: 104 mmol/L (ref 98–111)
Creatinine, Ser: 0.88 mg/dL (ref 0.61–1.24)
GFR calc Af Amer: >60 mL/min (ref >60)
GFR calc non Af Amer: >60 mL/min (ref >60)
GLUCOSE: 166 mg/dL — AB (ref 70–99)
Potassium: 4 mmol/L (ref 3.5–5.1)
Sodium: 137 mmol/L (ref 135–145)

## 2018-03-18 LAB — BLOOD CULTURE ID PANEL (REFLEXED)
Acinetobacter baumannii: NOT DETECTED
CANDIDA TROPICALIS: NOT DETECTED
Candida albicans: NOT DETECTED
Candida glabrata: NOT DETECTED
Candida krusei: NOT DETECTED
Candida parapsilosis: NOT DETECTED
Enterobacter cloacae complex: NOT DETECTED
Enterobacteriaceae species: NOT DETECTED
Enterococcus species: NOT DETECTED
Escherichia coli: NOT DETECTED
Haemophilus influenzae: NOT DETECTED
KLEBSIELLA PNEUMONIAE: NOT DETECTED
Klebsiella oxytoca: NOT DETECTED
Listeria monocytogenes: NOT DETECTED
Methicillin resistance: DETECTED — AB
Neisseria meningitidis: NOT DETECTED
Proteus species: NOT DETECTED
Pseudomonas aeruginosa: NOT DETECTED
Serratia marcescens: NOT DETECTED
Staphylococcus aureus (BCID): NOT DETECTED
Staphylococcus species: DETECTED — AB
Streptococcus agalactiae: NOT DETECTED
Streptococcus pneumoniae: NOT DETECTED
Streptococcus pyogenes: NOT DETECTED
Streptococcus species: NOT DETECTED

## 2018-03-18 LAB — GLUCOSE, CAPILLARY
Glucose-Capillary: 116 mg/dL — ABNORMAL HIGH (ref 70–99)
Glucose-Capillary: 184 mg/dL — ABNORMAL HIGH (ref 70–99)
Glucose-Capillary: 114 mg/dL — ABNORMAL HIGH (ref 70–99)
Glucose-Capillary: 133 mg/dL — ABNORMAL HIGH (ref 70–99)

## 2018-03-18 MED ORDER — PHENOL 1.4 % MT LIQD
1.0000 | OROMUCOSAL | Status: DC | PRN
  Filled 2018-03-18: qty 177

## 2018-03-18 NOTE — Progress Notes (Signed)
PHARMACY - PHYSICIAN COMMUNICATION CRITICAL VALUE ALERT - BLOOD CULTURE IDENTIFICATION (BCID)  [name] is an 58 y.o. male who presented to [facility] on 03/16/2018 with a chief complaint of PNA  Assessment:  1/3 BC positive for MRSE, ? contaminant  Name of physician (or Provider) Contacted: Bodenheimer  Current antibiotics:unasyn  Changes to prescribed antibiotics recommended:  None  Results for orders placed or performed during the hospital encounter of 03/16/18  Blood Culture ID Panel (Reflexed) (Collected: 03/16/2018  1:20 PM)  Result Value Ref Range   Enterococcus species NOT DETECTED NOT DETECTED   Listeria monocytogenes NOT DETECTED NOT DETECTED   Staphylococcus species DETECTED (A) NOT DETECTED   Staphylococcus aureus (BCID) NOT DETECTED NOT DETECTED   Methicillin resistance DETECTED (A) NOT DETECTED   Streptococcus species NOT DETECTED NOT DETECTED   Streptococcus agalactiae NOT DETECTED NOT DETECTED   Streptococcus pneumoniae NOT DETECTED NOT DETECTED   Streptococcus pyogenes NOT DETECTED NOT DETECTED   Acinetobacter baumannii NOT DETECTED NOT DETECTED   Enterobacteriaceae species NOT DETECTED NOT DETECTED   Enterobacter cloacae complex NOT DETECTED NOT DETECTED   Escherichia coli NOT DETECTED NOT DETECTED   Klebsiella oxytoca NOT DETECTED NOT DETECTED   Klebsiella pneumoniae NOT DETECTED NOT DETECTED   Proteus species NOT DETECTED NOT DETECTED   Serratia marcescens NOT DETECTED NOT DETECTED   Haemophilus influenzae NOT DETECTED NOT DETECTED   Neisseria meningitidis NOT DETECTED NOT DETECTED   Pseudomonas aeruginosa NOT DETECTED NOT DETECTED   Candida albicans NOT DETECTED NOT DETECTED   Candida glabrata NOT DETECTED NOT DETECTED   Candida krusei NOT DETECTED NOT DETECTED   Candida parapsilosis NOT DETECTED NOT DETECTED   Candida tropicalis NOT DETECTED NOT DETECTED    [name] 03/18/2018  1:11 AM

## 2018-03-18 NOTE — Progress Notes (Signed)
Pt refuses to wear CPAP for the night.  

## 2018-03-18 NOTE — Progress Notes (Signed)
PROGRESS NOTE        PATIENT DETAILS Name: [name] Age: 58 y.o. Sex: male Date of Birth: [birth year] Admit Date: 03/16/2018 Admitting Physician Eugenie Filler, MD JAS:NKNL, Ander Gaster, MD  Brief Narrative: Patient is a 58 y.o. 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  See below for further details.  Subjective: No further vomiting.  Cough continues.  Still very weak.  Assessment/Plan: Necrotizing pneumonia of left lung: Slowly improving-afebrile afebrile-leukocytosis has resolved.  1/2 blood culture positive for MRSE-likely contaminant.  Continue Unasyn.  Appreciate ID eval.    Oral thrush: Continue nystatin  Anemia: Appears to be microcytic-ferritin levels are normal-suspect has anemia of chronic disease-worsened by acute illness.  Hemoglobin has improved-suspect yesterday's values was likely secondary to IV fluid dilution.  Follow.    Dehydration: Volume status is markedly better-decrease IV fluids.   DM-2: CBG stable-continue SSI.    COPD/asthma: Some scattered rhonchi but otherwise clear-no evidence of exacerbation-continue bronchodilators.  GERD: Continue PPI  CVA/PAD s/p right AKA: Continue aspirin/Plavix and statin  OSA: CPAP nightly  Severe protein calorie malnutrition: Continue supplements  Tobacco/cocaine use: Counseled-not sure if he has any indication of quitting at this point.  Chronic pain syndrome: Continue Lyrica and as needed tramadol  Debility/deconditioning: Secondary to necrotizing pneumonia of the left lung, dehydration-we will ask social work to see if patient can be transferred to SNF discharge.  DVT Prophylaxis: Prophylactic Lovenox   Code Status: Full  code   Family Communication: None at bedside  Disposition Plan: Remain inpatient-SNF versus home health services on discharge over the next few days  Antimicrobial agents: Anti-infectives (From admission, onward)   Start     Dose/Rate Route Frequency Ordered Stop   03/17/18 1700  fluconazole (DIFLUCAN) IVPB 100 mg  Status:  Discontinued     100 mg 50 mL/hr over 60 Minutes Intravenous Every 24 hours 03/16/18 1706 03/17/18 1121   03/17/18 0500  linezolid (ZYVOX) IVPB 600 mg  Status:  Discontinued     600 mg 300 mL/hr over 60 Minutes Intravenous Every 12 hours 03/16/18 1703 03/17/18 0923   03/16/18 2130  ceFEPIme (MAXIPIME) 1 g in sodium chloride 0.9 % 100 mL IVPB  Status:  Discontinued     1 g 200 mL/hr over 30 Minutes Intravenous Every 8 hours 03/16/18 1626 03/16/18 1705   03/16/18 1930  Ampicillin-Sulbactam (UNASYN) 3 g in sodium chloride 0.9 % 100 mL IVPB     3 g 200 mL/hr over 30 Minutes Intravenous Every 6 hours 03/16/18 1724     03/16/18 1715  fluconazole (DIFLUCAN) IVPB 200 mg  Status:  Discontinued     200 mg 100 mL/hr over 60 Minutes Intravenous  Once 03/16/18 1706 03/17/18 1121   03/16/18 1300  ceFEPIme (MAXIPIME) 2 g in sodium chloride 0.9 % 100 mL IVPB     2 g 200 mL/hr over 30 Minutes Intravenous  Once 03/16/18 1235 03/16/18 1408   03/16/18 1300  linezolid (ZYVOX) IVPB 600 mg     600 mg 300 mL/hr over 60 Minutes Intravenous  Once 03/16/18 1255 03/16/18 1821      Procedures:  None  CONSULTS:  None  Time spent: 25- minutes-Greater than 50% of this time was spent in counseling, explanation of diagnosis, planning of further management, and coordination of care.  MEDICATIONS: Scheduled Meds: . aspirin  81 mg Oral Daily  . atorvastatin  80 mg Oral q1800  . budesonide (PULMICORT) nebulizer solution  0.5 mg Nebulization BID  . clopidogrel  75 mg Oral Daily  . enoxaparin (LOVENOX) injection  40 mg Subcutaneous Q24H  . famotidine  20 mg Oral Daily  . feeding  supplement (ENSURE ENLIVE)  237 mL Oral TID BM  . fluticasone  2 spray Each Nare Daily  . guaiFENesin  1,200 mg Oral BID  . insulin aspart  0-9 Units Subcutaneous TID WC  . ipratropium-albuterol  3 mL Nebulization TID  . multivitamin with minerals  1 tablet Oral Daily  . nicotine  21 mg Transdermal Daily  . nystatin  5 mL Oral QID  . pantoprazole  40 mg Oral Q0600  . polyethylene glycol  17 g Oral Daily  . pregabalin  75 mg Oral TID  . sodium chloride flush  3 mL Intravenous Q12H   Continuous Infusions: . sodium chloride 100 mL/hr at 03/17/18 1734  . ampicillin-sulbactam (UNASYN) IV 3 g (03/18/18 0821)   PRN Meds:.acetaminophen **OR** acetaminophen, hydrOXYzine, ketorolac, magnesium citrate, oxyCODONE-acetaminophen, prochlorperazine, senna-docusate, sorbitol, traMADol   PHYSICAL EXAM: Vital signs: Vitals:   03/17/18 2127 03/18/18 0522 03/18/18 0526 03/18/18 0736  BP: (!) 109/53 137/80    Pulse: 87 (!) 121 80 83  Resp: 18 18  18   Temp: 98.6 F (37 C) 98.1 F (36.7 C)    TempSrc: Oral Oral    SpO2: 100% 94%  95%  Weight:  61.5 kg    Height:       Filed Weights   03/16/18 0905 03/17/18 0439 03/18/18 0522  Weight: 63.5 kg 59.1 kg 61.5 kg   Body mass index is 17.89 kg/m.   General appearance:Awake, alert, not in any distress.  Eyes:no scleral icterus. HEENT: Atraumatic and Normocephalic Neck: supple, no JVD. Resp:Good air entry bilaterally, few rales in the left lung. CVS: S1 S2 regular, no murmurs.  GI: Bowel sounds present, Non tender and not distended with no gaurding, rigidity or rebound. Extremities: Right AKA Neurology:  Non focal Musculoskeletal:No digital cyanosis Skin:No Rash, warm and dry Wounds:N/A  I have personally reviewed following labs and imaging studies  LABORATORY DATA: CBC: Recent Labs  Lab 03/16/18 0918 03/17/18 0438 03/18/18 0323  WBC 16.3* 13.6* 10.3  NEUTROABS  --  10.6*  --   HGB 9.2* 7.8* 8.8*  HCT 30.1* 26.3* 30.1*  MCV 74.0*  73.9* 75.1*  PLT 433* 337 725    Basic Metabolic Panel: Recent Labs  Lab 03/16/18 0918 03/16/18 1857 03/17/18 0438 03/18/18 0323  NA 135  --  135 137  K 3.7  --  3.7 4.0  CL 101  --  103 104  CO2 24  --  20* 23  GLUCOSE 165*  --  133* 166*  BUN 9  --  8 11  CREATININE 0.78  --  0.75 0.88  CALCIUM 8.6*  --  7.9* 8.0*  MG  --  1.8  --   --   PHOS  --   --  4.3  --     GFR: Estimated Creatinine Clearance: 78.6 mL/min (by C-G formula based on SCr of 0.88 mg/dL).  Liver Function Tests: Recent Labs  Lab 03/16/18 0918 03/17/18 0438  AST 34  23  ALT 29 23  ALKPHOS 105 89  BILITOT 0.6 0.3  PROT 7.6 6.5  ALBUMIN 2.2* 1.8*   Recent Labs  Lab 03/16/18 0918  LIPASE 42   No results for input(s): AMMONIA in the last 168 hours.  Coagulation Profile: No results for input(s): INR, PROTIME in the last 168 hours.  Cardiac Enzymes: No results for input(s): CKTOTAL, CKMB, CKMBINDEX, TROPONINI in the last 168 hours.  BNP (last 3 results) No results for input(s): PROBNP in the last 8760 hours.  HbA1C: Recent Labs    03/17/18 0438  HGBA1C 7.3*    CBG: Recent Labs  Lab 03/17/18 0813 03/17/18 1301 03/17/18 1725 03/17/18 2125 03/18/18 0723  GLUCAP 143* 133* 113* 140* 116*    Lipid Profile: No results for input(s): CHOL, HDL, LDLCALC, TRIG, CHOLHDL, LDLDIRECT in the last 72 hours.  Thyroid Function Tests: No results for input(s): TSH, T4TOTAL, FREET4, T3FREE, THYROIDAB in the last 72 hours.  Anemia Panel: Recent Labs    03/16/18 1857 03/17/18 0438  VITAMINB12 559 449  FOLATE 10.3 11.8  FERRITIN 256 209  TIBC 175* 141*  IRON 12* 7*  RETICCTPCT 0.6  --     Urine analysis:    Component Value Date/Time   COLORURINE YELLOW 03/16/2018 1904   APPEARANCEUR CLEAR 03/16/2018 1904   LABSPEC 1.042 (H) 03/16/2018 1904   PHURINE 7.0 03/16/2018 1904   GLUCOSEU NEGATIVE 03/16/2018 1904   GLUCOSEU > 1000 mg/dL (A) 04/06/2008 2035   HGBUR NEGATIVE 03/16/2018 1904     BILIRUBINUR NEGATIVE 03/16/2018 1904   KETONESUR NEGATIVE 03/16/2018 1904   PROTEINUR NEGATIVE 03/16/2018 1904   UROBILINOGEN 1.0 11/21/2014 1600   NITRITE NEGATIVE 03/16/2018 1904   LEUKOCYTESUR NEGATIVE 03/16/2018 1904    Sepsis Labs: Lactic Acid, Venous    Component Value Date/Time   LATICACIDVEN 1.83 01/21/2018 1854    MICROBIOLOGY: Recent Results (from the past 240 hour(s))  Blood culture (routine x 2)     Status: Abnormal (Preliminary result)   Collection Time: 03/16/18  1:20 PM  Result Value Ref Range Status   Specimen Description BLOOD LEFT HAND  Final   Special Requests   Final    BOTTLES DRAWN AEROBIC ONLY Blood Culture results may not be optimal due to an inadequate volume of blood received in culture bottles   Culture  Setup Time   Final    AEROBIC BOTTLE ONLY GRAM POSITIVE COCCI CRITICAL RESULT CALLED TO, READ BACK BY AND VERIFIED WITH: L SEAY PHARMD 03/18/18 0105 JDW Performed at [facility], [place] [address] [place], [place] [address]    Culture STAPHYLOCOCCUS SPECIES (COAGULASE NEGATIVE) (A)  Final   Report Status PENDING  Incomplete  Blood Culture ID Panel (Reflexed)     Status: Abnormal   Collection Time: 03/16/18  1:20 PM  Result Value Ref Range Status   Enterococcus species NOT DETECTED NOT DETECTED Final   Listeria monocytogenes NOT DETECTED NOT DETECTED Final   Staphylococcus species DETECTED (A) NOT DETECTED Final    Comment: Methicillin (oxacillin) resistant coagulase negative staphylococcus. Possible blood culture contaminant (unless isolated from more than one blood culture draw or clinical case suggests pathogenicity). No antibiotic treatment is indicated for blood  culture contaminants. CRITICAL RESULT CALLED TO, READ BACK BY AND VERIFIED WITH: L SEAY PHARMD 03/18/18 0105 JDW    Staphylococcus aureus (BCID) NOT DETECTED NOT DETECTED Final   Methicillin resistance DETECTED (A) NOT DETECTED Final    Comment: CRITICAL RESULT CALLED TO, READ  BACK  BY AND VERIFIED WITH: L SEAY PHARMD 03/18/18 0105 JDW    Streptococcus species NOT DETECTED NOT DETECTED Final   Streptococcus agalactiae NOT DETECTED NOT DETECTED Final   Streptococcus pneumoniae NOT DETECTED NOT DETECTED Final   Streptococcus pyogenes NOT DETECTED NOT DETECTED Final   Acinetobacter baumannii NOT DETECTED NOT DETECTED Final   Enterobacteriaceae species NOT DETECTED NOT DETECTED Final   Enterobacter cloacae complex NOT DETECTED NOT DETECTED Final   Escherichia coli NOT DETECTED NOT DETECTED Final   Klebsiella oxytoca NOT DETECTED NOT DETECTED Final   Klebsiella pneumoniae NOT DETECTED NOT DETECTED Final   Proteus species NOT DETECTED NOT DETECTED Final   Serratia marcescens NOT DETECTED NOT DETECTED Final   Haemophilus influenzae NOT DETECTED NOT DETECTED Final   Neisseria meningitidis NOT DETECTED NOT DETECTED Final   Pseudomonas aeruginosa NOT DETECTED NOT DETECTED Final   Candida albicans NOT DETECTED NOT DETECTED Final   Candida glabrata NOT DETECTED NOT DETECTED Final   Candida krusei NOT DETECTED NOT DETECTED Final   Candida parapsilosis NOT DETECTED NOT DETECTED Final   Candida tropicalis NOT DETECTED NOT DETECTED Final    Comment: Performed at [facility], [place] [address], [place], [place] [address]  Urine Culture     Status: None   Collection Time: 03/16/18  4:29 PM  Result Value Ref Range Status   Specimen Description URINE, CLEAN CATCH  Final   Special Requests NONE  Final   Culture   Final    NO GROWTH Performed at [facility], [place] [address], [place], [place] [address]    Report Status 03/17/2018 FINAL  Final  MRSA PCR Screening     Status: None   Collection Time: 03/16/18  5:40 PM  Result Value Ref Range Status   MRSA by PCR NEGATIVE NEGATIVE Final    Comment:        The GeneXpert MRSA Assay (FDA approved for NASAL specimens only), is one component of a comprehensive MRSA colonization surveillance program. It is not intended  to diagnose MRSA infection nor to guide or monitor treatment for MRSA infections. Performed at [facility], [place] [address] [place], [place] [address]   Blood culture (routine x 2)     Status: None (Preliminary result)   Collection Time: 03/16/18  6:58 PM  Result Value Ref Range Status   Specimen Description BLOOD LEFT ANTECUBITAL  Final   Special Requests   Final    BOTTLES DRAWN AEROBIC AND ANAEROBIC Blood Culture adequate volume   Culture   Final    NO GROWTH < 24 HOURS Performed at [facility], [place] [address] [place], [place] [address]    Report Status PENDING  Incomplete    RADIOLOGY STUDIES/RESULTS: Dg Chest 2 View  Result Date: 02/26/2018 CLINICAL DATA:  Cough productive of green mucus, shortness of breath, and central chest pain since yesterday, LEFT femur surgery 3 weeks ago, history hypertension, diabetes mellitus, COPD, CHF, asthma, pulmonary embolism, TB exposure EXAM: CHEST - 2 VIEW COMPARISON:  None FINDINGS: Normal heart size, mediastinal contours, and pulmonary vascularity. Atherosclerotic calcification aorta. Emphysematous changes with focal LEFT lower lobe consolidation consistent with pneumonia. Minimal subsegmental atelectasis at RIGHT base. Remaining lungs clear. No pleural effusion or pneumothorax. Multiple old fractures of LEFT ribs and LEFT clavicle. Osseous demineralization. IMPRESSION: COPD changes with LEFT lower lobe pneumonia and mild RIGHT basilar atelectasis. Electronically Signed   By: Lavonia Dana M.D.   On: 02/26/2018 17:15   Ct  Chest W Contrast  Result Date: 03/16/2018 CLINICAL DATA:  Mass-like consolidation in the left lower lobe on recent chest radiographs and abdomen and pelvis CT. The patient has had worsening nausea and vomiting and at the time of the chest radiographs had a productive cough, shortness of breath and central chest pain. History of COPD, asthma, pulmonary embolism and TB exposure. The patient is a current smoker, smoking 2  packs of cigarettes per day with a 104 pack-year history of smoking. EXAM: CT CHEST WITH CONTRAST TECHNIQUE: Multidetector CT imaging of the chest was performed during intravenous contrast administration. CONTRAST:  60mL OMNIPAQUE IOHEXOL 300 MG/ML  SOLN COMPARISON:  Chest radiographs dated 02/26/2018 and abdomen and pelvis CT dated 03/16/2018. FINDINGS: Cardiovascular: Atheromatous calcifications, including the coronary arteries and aorta. Normal sized heart. Mediastinum/Nodes: Multiple enlarged mediastinal and bilateral hilar lymph nodes, greatest in the left hilar region. These include a 12 mm short axis AP window node on image number 62 series 4, 10 mm short axis right anterior paratracheal node on image number 43 series 4, 14 mm short axis subcarinal node on image number 68 series 4, 12 mm short axis right hilar node on image number 70 series 4 and a 17 mm short axis left hilar node on image number 86 series 4. Unremarkable thyroid gland and esophagus. Lungs/Pleura: Patchy and dense, mass-like airspace consolidation in the posterior aspect of the left lower lobe with a prominent oval central low density component measuring 6.1 x 4.2 cm on image number 99 series 4. There is also mucous plugging in multiple left lower lobe bronchi. This is relatively low density with small amount of interspersed air. Mild diffuse peribronchial thickening and mild centrilobular bullous changes. Small amount of bilateral linear atelectasis or scarring. No pleural fluid. Upper Abdomen: Atheromatous arterial calcifications without aneurysm. Mild diffuse low density of the liver relative to the spleen. Musculoskeletal: Old, healed bilateral rib fractures. Thoracic and lower cervical spine degenerative changes. IMPRESSION: 1. 6.1 x 4.2 cm mass-like area of consolidation in the posterior aspect of the left lower lobe with a central low density component. This has an appearance most compatible with necrotizing pneumonia and possible lung  abscess. An underlying cavitary neoplasm is less likely but not excluded. 2. Mucous plugging involving multiple left lower lobe bronchi. 3. Mild mediastinal and bilateral hilar adenopathy, most likely reactive. Metastatic adenopathy is also a possibility. 4.  Calcific coronary artery and aortic atherosclerosis. 5. Mild changes of COPD and chronic bronchitis. 6. Mild diffuse hepatic steatosis. Aortic Atherosclerosis (ICD10-I70.0) and Emphysema (ICD10-J43.9). Electronically Signed   By: Claudie Revering M.D.   On: 03/16/2018 14:54   Ct Abdomen Pelvis W Contrast  Result Date: 03/16/2018 CLINICAL DATA:  Nausea, vomiting, worsening EXAM: CT ABDOMEN AND PELVIS WITH CONTRAST TECHNIQUE: Multidetector CT imaging of the abdomen and pelvis was performed using the standard protocol following bolus administration of intravenous contrast. CONTRAST:  186mL OMNIPAQUE IOHEXOL 300 MG/ML  SOLN COMPARISON:  Chest radiograph 02/26/2018 FINDINGS: Lower chest: There is a partially included very dense, masslike consolidation of the left lower lobe, in keeping with findings of prior radiograph (series 3, image 1). Hepatobiliary: No focal liver abnormality is seen. No gallstones, gallbladder wall thickening, or biliary dilatation. Pancreas: Unremarkable. No pancreatic ductal dilatation or surrounding inflammatory changes. Spleen: Splenomegaly, maximum span approximately 15.9 cm. Adrenals/Urinary Tract: Adrenal glands are unremarkable. Kidneys are normal, without renal calculi, focal lesion, or hydronephrosis. Bladder is unremarkable. Stomach/Bowel: Stomach is within normal limits. Appendix not clearly visualized. No evidence of  bowel wall thickening, distention, or inflammatory changes. Large burden of stool in the colon. Vascular/Lymphatic: Severe mixed calcific atherosclerosis of the abdominal aorta and branch vessels, with a left common iliac artery stent, which appears patent. 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 No enlarged abdominal or pelvic lymph nodes. Reproductive: No mass or other abnormality. Other: No abdominal wall hernia or abnormality. No abdominopelvic ascites. Musculoskeletal: No acute or significant osseous findings. IMPRESSION: 1. There is a partially included very dense, masslike consolidation of the left lower lobe, in keeping with findings of prior radiograph (series 3, image 1). Findings are concerning for infection or aspiration, particularly necrotizing infection given the appearance. Underlying mass is not excluded and at minimum, follow-up CT or radiographs are warranted at 6-8 weeks to document complete resolution. 2.  Splenomegaly. 3. 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 Recommend vascular evaluation. Electronically Signed   By: Eddie Candle M.D.   On: 03/16/2018 11:56   Dg Abd Acute 2+v W 1v Chest  Result Date: 03/02/2018 CLINICAL DATA:  Cough nausea and vomiting EXAM: DG ABDOMEN ACUTE W/ 1V CHEST COMPARISON:  CT 01/21/2018, chest x-ray 01/21/2018 FINDINGS: Single-view chest demonstrates left perihilar airspace disease. Normal heart size. Old left upper rib deformities. Aortic atherosclerosis. Supine and decubitus views of the abdomen demonstrate no free air. Nonobstructed bowel-gas pattern with moderate stool. Left iliac stent. Clips in the right groin. Surgical hardware in the left femur. Faint linear calcifications in the region of the renal hilus, likely reflecting intrarenal vascular calcification. IMPRESSION: 1. Left perihilar airspace disease concerning for a pneumonia 2. Nonobstructed bowel-gas pattern Electronically Signed   By: Donavan Foil M.D.   On: 03/02/2018 23:50     LOS: 2 days   Oren Binet,  MD  [facility]  If 7PM-7AM, please contact night-coverage  Please page via [email or web address]  Go to [email or web address] and use [facility]'s universal password to access. If you do not have the password, please contact the hospital operator.  Locate the [facility] provider you are looking for under [facility] and page to a number that you can be directly reached. If you still have difficulty reaching the provider, please page the [facility] (Director on Call) for the Hospitalists listed on amion for assistance.  03/18/2018, 9:30 AM

## 2018-03-18 NOTE — Progress Notes (Signed)
[facility] for Infectious Disease  Date of Admission:  03/16/2018     Total days of antibiotics 3         ASSESSMENT/PLAN  Mr. Giannelli has necrotizing pneumonia and mass like consolidation on CT scan. Source of infection appears to be poor dentition. Blood cultures overnight with Coag Negative Staph in 1/4 bottles is likely a contaminant. Afebrile and WBC count appears to be down trending and clinically appears improved today. Plan to continue current dose of Unasyn. Disposition remains to be determined.   1. Continue current dose of Unasyn. 2. Continue fluconazole for thrush.  2. Monitor fevers and WBC count.   Principal Problem:   Necrotizing pneumonia ([place]) Active Problems:   Type 2 diabetes mellitus (HCC)   Dyslipidemia   Anxiety state   SLEEP APNEA, OBSTRUCTIVE, MODERATE   HYPERTENSION, BENIGN ESSENTIAL   Peripheral vascular disease (HCC)   COPD (chronic obstructive pulmonary disease) (HCC)   Gastroesophageal reflux disease   Chronic pain in left foot   Tobacco abuse   Protein-calorie malnutrition, severe (HCC)   History of pulmonary embolism   Polysubstance abuse (HCC)   Microcytic anemia   Diabetes mellitus type 2 in nonobese (HCC)   Thrush, oral   Acute dehydration   Malnutrition of moderate degree   . aspirin  81 mg Oral Daily  . atorvastatin  80 mg Oral q1800  . budesonide (PULMICORT) nebulizer solution  0.5 mg Nebulization BID  . clopidogrel  75 mg Oral Daily  . enoxaparin (LOVENOX) injection  40 mg Subcutaneous Q24H  . famotidine  20 mg Oral Daily  . feeding supplement (ENSURE ENLIVE)  237 mL Oral TID BM  . fluticasone  2 spray Each Nare Daily  . guaiFENesin  1,200 mg Oral BID  . insulin aspart  0-9 Units Subcutaneous TID WC  . ipratropium-albuterol  3 mL Nebulization TID  . multivitamin with minerals  1 tablet Oral Daily  . nicotine  21 mg Transdermal Daily  . nystatin  5 mL Oral QID  . pantoprazole  40 mg Oral Q0600  . polyethylene glycol  17  g Oral Daily  . pregabalin  75 mg Oral TID  . sodium chloride flush  3 mL Intravenous Q12H    SUBJECTIVE:  Afebrile overnight. Blood cultures positive for Coag Negative Staph in 1/4 bottles. No acute events overnight.   Feeling better and breathing better today.   Allergies  Allergen Reactions  . Buprenorphine Hcl-Naloxone Hcl Shortness Of Breath and Other (See Comments)    "Felt like I was going to die," was  jittery, had trouble breathing, felt hot (reaction to Suboxone) PER THE [facility] database, the patient received #42 Suboxone films between 03/26/17 and 04/02/17 from [facility](??)  . Ciprofloxacin Anaphylaxis  . Morphine-Naltrexone Other (See Comments)    Seizures   . Shellfish-Derived Products Anaphylaxis, Hives and Swelling  . Benadryl [Diphenhydramine Hcl] Hives, Itching and Rash  . Fish Allergy Hives, Swelling and Rash  . Morphine And Related Other (See Comments)    Seizures  . Benadryl [Diphenhydramine]   . Ciprofloxacin   . Fish Allergy   . Morphine And Related   . Shellfish Allergy   . Suboxone [Buprenorphine Hcl-Naloxone Hcl]   . Vancomycin   . Vicodin [Hydrocodone-Acetaminophen]   . Vancomycin Rash  . Vicodin [Hydrocodone-Acetaminophen] Nausea Only     Review of Systems: Review of Systems  Constitutional: Negative for chills, fever and weight loss.  Respiratory: Negative for cough, shortness of breath  and wheezing.   Cardiovascular: Negative for chest pain and leg swelling.  Gastrointestinal: Negative for abdominal pain, constipation, diarrhea, nausea and vomiting.  Skin: Negative for rash.      OBJECTIVE: Vitals:   03/17/18 2127 03/18/18 0522 03/18/18 0526 03/18/18 0736  BP: (!) 109/53 137/80    Pulse: 87 (!) 121 80 83  Resp: 18 18  18   Temp: 98.6 F (37 C) 98.1 F (36.7 C)    TempSrc: Oral Oral    SpO2: 100% 94%  95%  Weight:  61.5 kg    Height:       Body mass index is 17.89 kg/m.  Physical Exam Constitutional:       General: He is not in acute distress.    Appearance: He is well-developed.  Cardiovascular:     Rate and Rhythm: Normal rate and regular rhythm.     Heart sounds: Normal heart sounds.  Pulmonary:     Effort: Pulmonary effort is normal.     Breath sounds: Wheezing present.  Abdominal:     General: Bowel sounds are normal.  Skin:    General: Skin is warm and dry.  Neurological:     Mental Status: He is alert and oriented to person, place, and time.  Psychiatric:        Mood and Affect: Mood normal.     Lab Results Lab Results  Component Value Date   WBC 10.3 03/18/2018   HGB 8.8 (L) 03/18/2018   HCT 30.1 (L) 03/18/2018   MCV 75.1 (L) 03/18/2018   PLT 379 03/18/2018    Lab Results  Component Value Date   CREATININE 0.88 03/18/2018   BUN 11 03/18/2018   NA 137 03/18/2018   K 4.0 03/18/2018   CL 104 03/18/2018   CO2 23 03/18/2018    Lab Results  Component Value Date   ALT 23 03/17/2018   AST 23 03/17/2018   ALKPHOS 89 03/17/2018   BILITOT 0.3 03/17/2018     Microbiology: Recent Results (from the past 240 hour(s))  Blood culture (routine x 2)     Status: Abnormal (Preliminary result)   Collection Time: 03/16/18  1:20 PM  Result Value Ref Range Status   Specimen Description BLOOD LEFT HAND  Final   Special Requests   Final    BOTTLES DRAWN AEROBIC ONLY Blood Culture results may not be optimal due to an inadequate volume of blood received in culture bottles   Culture  Setup Time   Final    AEROBIC BOTTLE ONLY GRAM POSITIVE COCCI CRITICAL RESULT CALLED TO, READ BACK BY AND VERIFIED WITH: L SEAY PHARMD 03/18/18 0105 JDW Performed at [facility], [address] [place], [place] [address]    Culture STAPHYLOCOCCUS SPECIES (COAGULASE NEGATIVE) (A)  Final   Report Status PENDING  Incomplete  Blood Culture ID Panel (Reflexed)     Status: Abnormal   Collection Time: 03/16/18  1:20 PM  Result Value Ref Range Status   Enterococcus species NOT DETECTED NOT DETECTED  Final   Listeria monocytogenes NOT DETECTED NOT DETECTED Final   Staphylococcus species DETECTED (A) NOT DETECTED Final    Comment: Methicillin (oxacillin) resistant coagulase negative staphylococcus. Possible blood culture contaminant (unless isolated from more than one blood culture draw or clinical case suggests pathogenicity). No antibiotic treatment is indicated for blood  culture contaminants. CRITICAL RESULT CALLED TO, READ BACK BY AND VERIFIED WITH: L SEAY PHARMD 03/18/18 0105 JDW    Staphylococcus aureus (BCID) NOT DETECTED  NOT DETECTED Final   Methicillin resistance DETECTED (A) NOT DETECTED Final    Comment: CRITICAL RESULT CALLED TO, READ BACK BY AND VERIFIED WITH: L SEAY PHARMD 03/18/18 0105 JDW    Streptococcus species NOT DETECTED NOT DETECTED Final   Streptococcus agalactiae NOT DETECTED NOT DETECTED Final   Streptococcus pneumoniae NOT DETECTED NOT DETECTED Final   Streptococcus pyogenes NOT DETECTED NOT DETECTED Final   Acinetobacter baumannii NOT DETECTED NOT DETECTED Final   Enterobacteriaceae species NOT DETECTED NOT DETECTED Final   Enterobacter cloacae complex NOT DETECTED NOT DETECTED Final   Escherichia coli NOT DETECTED NOT DETECTED Final   Klebsiella oxytoca NOT DETECTED NOT DETECTED Final   Klebsiella pneumoniae NOT DETECTED NOT DETECTED Final   Proteus species NOT DETECTED NOT DETECTED Final   Serratia marcescens NOT DETECTED NOT DETECTED Final   Haemophilus influenzae NOT DETECTED NOT DETECTED Final   Neisseria meningitidis NOT DETECTED NOT DETECTED Final   Pseudomonas aeruginosa NOT DETECTED NOT DETECTED Final   Candida albicans NOT DETECTED NOT DETECTED Final   Candida glabrata NOT DETECTED NOT DETECTED Final   Candida krusei NOT DETECTED NOT DETECTED Final   Candida parapsilosis NOT DETECTED NOT DETECTED Final   Candida tropicalis NOT DETECTED NOT DETECTED Final    Comment: Performed at [facility], [place]. [address], [place], [place] [address]    Urine Culture     Status: None   Collection Time: 03/16/18  4:29 PM  Result Value Ref Range Status   Specimen Description URINE, CLEAN CATCH  Final   Special Requests NONE  Final   Culture   Final    NO GROWTH Performed at [facility], [place] [address], [place], [place] [address]    Report Status 03/17/2018 FINAL  Final  MRSA PCR Screening     Status: None   Collection Time: 03/16/18  5:40 PM  Result Value Ref Range Status   MRSA by PCR NEGATIVE NEGATIVE Final    Comment:        The GeneXpert MRSA Assay (FDA approved for NASAL specimens only), is one component of a comprehensive MRSA colonization surveillance program. It is not intended to diagnose MRSA infection nor to guide or monitor treatment for MRSA infections. Performed at [facility], [place] [address], [place], [place] [address]   Blood culture (routine x 2)     Status: None (Preliminary result)   Collection Time: 03/16/18  6:58 PM  Result Value Ref Range Status   Specimen Description BLOOD LEFT ANTECUBITAL  Final   Special Requests   Final    BOTTLES DRAWN AEROBIC AND ANAEROBIC Blood Culture adequate volume   Culture   Final    NO GROWTH < 24 HOURS Performed at [facility], [place] [address] [place],  [address]    Report Status PENDING  Incomplete     Terri Piedra, [place] [facility] [place] Pager  03/18/2018  11:57 AM

## 2018-03-19 DIAGNOSIS — B379 Candidiasis, unspecified: Secondary | ICD-10-CM

## 2018-03-19 LAB — GLUCOSE, CAPILLARY
Glucose-Capillary: 126 mg/dL — ABNORMAL HIGH (ref 70–99)
Glucose-Capillary: 155 mg/dL — ABNORMAL HIGH (ref 70–99)
Glucose-Capillary: 131 mg/dL — ABNORMAL HIGH (ref 70–99)
Glucose-Capillary: 162 mg/dL — ABNORMAL HIGH (ref 70–99)

## 2018-03-19 MED ORDER — IPRATROPIUM-ALBUTEROL 0.5-2.5 (3) MG/3ML IN SOLN
3.0000 mL | Freq: Two times a day (BID) | RESPIRATORY_TRACT | Status: DC
  Administered 2018-03-19 – 2018-03-21 (×4): 3 mL via RESPIRATORY_TRACT
  Filled 2018-03-19 (×4): qty 3

## 2018-03-19 MED ORDER — IPRATROPIUM-ALBUTEROL 0.5-2.5 (3) MG/3ML IN SOLN: 3.0000 mL | RESPIRATORY_TRACT | Status: DC | PRN

## 2018-03-19 NOTE — Progress Notes (Signed)
Patient refused CPAP for tonight 

## 2018-03-19 NOTE — Progress Notes (Signed)
[facility] for Infectious Disease   Reason for visit: Follow up on pneumonia  Interval History: feels somewhat better, WBC wnl yesterday, remains afebrile.  No sputum culture done.   No new issues Day 4 amp/sulbactam  Physical Exam: Constitutional:  Vitals:   03/19/18 0709 03/19/18 1217  BP:  128/81  Pulse:  89  Resp:  20  Temp:    SpO2: 98% 97%   patient appears in NAD Eyes: anicteric HENT: poor dentition Respiratory: Normal respiratory effort; CTA B Cardiovascular: RRR GI: soft, nt, nd  Review of Systems: Constitutional: negative for fevers and chills Respiratory: positive for pneumonia or dyspnea on exertion, negative for cough or sputum  Lab Results  Component Value Date   WBC 10.3 03/18/2018   HGB 8.8 (L) 03/18/2018   HCT 30.1 (L) 03/18/2018   MCV 75.1 (L) 03/18/2018   PLT 379 03/18/2018    Lab Results  Component Value Date   CREATININE 0.88 03/18/2018   BUN 11 03/18/2018   NA 137 03/18/2018   K 4.0 03/18/2018   CL 104 03/18/2018   CO2 23 03/18/2018    Lab Results  Component Value Date   ALT 23 03/17/2018   AST 23 03/17/2018   ALKPHOS 89 03/17/2018     Microbiology: Recent Results (from the past 240 hour(s))  Blood culture (routine x 2)     Status: Abnormal (Preliminary result)   Collection Time: 03/16/18  1:20 PM  Result Value Ref Range Status   Specimen Description BLOOD LEFT HAND  Final   Special Requests   Final    BOTTLES DRAWN AEROBIC ONLY Blood Culture results may not be optimal due to an inadequate volume of blood received in culture bottles   Culture  Setup Time   Final    AEROBIC BOTTLE ONLY GRAM POSITIVE COCCI CRITICAL RESULT CALLED TO, READ BACK BY AND VERIFIED WITH: L SEAY PHARMD 03/18/18 0105 JDW    Culture (A)  Final    STAPHYLOCOCCUS SPECIES (COAGULASE NEGATIVE) THE SIGNIFICANCE OF ISOLATING THIS ORGANISM FROM A SINGLE SET OF BLOOD CULTURES WHEN MULTIPLE SETS ARE DRAWN IS UNCERTAIN. PLEASE NOTIFY THE MICROBIOLOGY  DEPARTMENT WITHIN ONE WEEK IF SPECIATION AND SENSITIVITIES ARE REQUIRED. Performed at [facility], [place] [address] [place], [place] [address]    Report Status PENDING  Incomplete  Blood Culture ID Panel (Reflexed)     Status: Abnormal   Collection Time: 03/16/18  1:20 PM  Result Value Ref Range Status   Enterococcus species NOT DETECTED NOT DETECTED Final   Listeria monocytogenes NOT DETECTED NOT DETECTED Final   Staphylococcus species DETECTED (A) NOT DETECTED Final    Comment: Methicillin (oxacillin) resistant coagulase negative staphylococcus. Possible blood culture contaminant (unless isolated from more than one blood culture draw or clinical case suggests pathogenicity). No antibiotic treatment is indicated for blood  culture contaminants. CRITICAL RESULT CALLED TO, READ BACK BY AND VERIFIED WITH: L SEAY PHARMD 03/18/18 0105 JDW    Staphylococcus aureus (BCID) NOT DETECTED NOT DETECTED Final   Methicillin resistance DETECTED (A) NOT DETECTED Final    Comment: CRITICAL RESULT CALLED TO, READ BACK BY AND VERIFIED WITH: L SEAY PHARMD 03/18/18 0105 JDW    Streptococcus species NOT DETECTED NOT DETECTED Final   Streptococcus agalactiae NOT DETECTED NOT DETECTED Final   Streptococcus pneumoniae NOT DETECTED NOT DETECTED Final   Streptococcus pyogenes NOT DETECTED NOT DETECTED Final   Acinetobacter baumannii NOT DETECTED NOT DETECTED Final   Enterobacteriaceae species NOT DETECTED  NOT DETECTED Final   Enterobacter cloacae complex NOT DETECTED NOT DETECTED Final   Escherichia coli NOT DETECTED NOT DETECTED Final   Klebsiella oxytoca NOT DETECTED NOT DETECTED Final   Klebsiella pneumoniae NOT DETECTED NOT DETECTED Final   Proteus species NOT DETECTED NOT DETECTED Final   Serratia marcescens NOT DETECTED NOT DETECTED Final   Haemophilus influenzae NOT DETECTED NOT DETECTED Final   Neisseria meningitidis NOT DETECTED NOT DETECTED Final   Pseudomonas aeruginosa NOT DETECTED NOT DETECTED  Final   Candida albicans NOT DETECTED NOT DETECTED Final   Candida glabrata NOT DETECTED NOT DETECTED Final   Candida krusei NOT DETECTED NOT DETECTED Final   Candida parapsilosis NOT DETECTED NOT DETECTED Final   Candida tropicalis NOT DETECTED NOT DETECTED Final    Comment: Performed at [facility], [place] [address] [place], [place] [address]  Urine Culture     Status: None   Collection Time: 03/16/18  4:29 PM  Result Value Ref Range Status   Specimen Description URINE, CLEAN CATCH  Final   Special Requests NONE  Final   Culture   Final    NO GROWTH Performed at [facility], [place] [address], [place], [place] [address]    Report Status 03/17/2018 FINAL  Final  MRSA PCR Screening     Status: None   Collection Time: 03/16/18  5:40 PM  Result Value Ref Range Status   MRSA by PCR NEGATIVE NEGATIVE Final    Comment:        The GeneXpert MRSA Assay (FDA approved for NASAL specimens only), is one component of a comprehensive MRSA colonization surveillance program. It is not intended to diagnose MRSA infection nor to guide or monitor treatment for MRSA infections. Performed at [facility], [place] [address] [place], [place] [address]   Blood culture (routine x 2)     Status: None (Preliminary result)   Collection Time: 03/16/18  6:58 PM  Result Value Ref Range Status   Specimen Description BLOOD LEFT ANTECUBITAL  Final   Special Requests   Final    BOTTLES DRAWN AEROBIC AND ANAEROBIC Blood Culture adequate volume   Culture   Final    NO GROWTH 3 DAYS Performed at [facility], [place] [address], [place], [place] [address]    Report Status PENDING  Incomplete    Impression/Plan:  1. Necrotizing pneumonia - probable anaerobic and slow improvement on amp/sulbactam.  Will need prolonged Augmentin at discharge for about 4 weeks and follow up CXR in about 1 month.  2.  Poor dentition - will need to see a dentist   3.  Thrush - fluconazole 14 days

## 2018-03-19 NOTE — Progress Notes (Signed)
PROGRESS NOTE        PATIENT DETAILS Name: [name] Age: 58 y.o. Sex: male Date of Birth: 30-Apr-1960 Admit Date: 03/16/2018 Admitting Physician Eugenie Filler, MD SAY:TKZS, Ander Gaster, MD  Brief Narrative: Patient is a 58 y.o. 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  See below for further details.  Subjective: No vomiting-continues to cough-appears weak.  No shortness of breath.  Lying comfortably in bed  Assessment/Plan: Necrotizing pneumonia of left lung: Slow improvement continues, afebrile, leukocytosis has resolved.  1/2 blood culture positive for MRSA ED-suspect contamination.  ID following-remains on IV Unasyn.  preciate ID eval.    Oral thrush: Continue nystatin  Anemia: Appears to be microcytic-ferritin levels are normal-suspect has anemia of chronic disease-worsened by acute illness.  Hemoglobin has improved-suspect yesterday's values was likely secondary to IV fluid dilution.  Follow.    Dehydration: Volume status improved-now euvolemic.  Off all IV fluids.  DM-2: BG stable-continue SSI  COPD/asthma: Some scattered rhonchi but otherwise clear-no evidence of exacerbation-continue bronchodilators.  GERD: Continue PPI  CVA/PAD s/p right AKA: Continue aspirin/Plavix and statin  OSA: CPAP nightly  Severe protein calorie malnutrition: Continue supplements  Tobacco/cocaine use: Counseled-not sure if he has any indication of quitting at this point.  Chronic pain syndrome: Continue Lyrica and as needed tramadol  Debility/deconditioning: Secondary to necrotizing pneumonia of the left lung, dehydration-we will ask social work to see if patient can be transferred to SNF  discharge.  DVT Prophylaxis: Prophylactic Lovenox   Code Status: Full code   Family Communication: None at bedside  Disposition Plan: Remain inpatient-SNF versus home health services on discharge over the next few days  Antimicrobial agents: Anti-infectives (From admission, onward)   Start     Dose/Rate Route Frequency Ordered Stop   03/17/18 1700  fluconazole (DIFLUCAN) IVPB 100 mg  Status:  Discontinued     100 mg 50 mL/hr over 60 Minutes Intravenous Every 24 hours 03/16/18 1706 03/17/18 1121   03/17/18 0500  linezolid (ZYVOX) IVPB 600 mg  Status:  Discontinued     600 mg 300 mL/hr over 60 Minutes Intravenous Every 12 hours 03/16/18 1703 03/17/18 0923   03/16/18 2130  ceFEPIme (MAXIPIME) 1 g in sodium chloride 0.9 % 100 mL IVPB  Status:  Discontinued     1 g 200 mL/hr over 30 Minutes Intravenous Every 8 hours 03/16/18 1626 03/16/18 1705   03/16/18 1930  Ampicillin-Sulbactam (UNASYN) 3 g in sodium chloride 0.9 % 100 mL IVPB     3 g 200 mL/hr over 30 Minutes Intravenous Every 6 hours 03/16/18 1724     03/16/18 1715  fluconazole (DIFLUCAN) IVPB 200 mg  Status:  Discontinued     200 mg 100 mL/hr over 60 Minutes Intravenous  Once 03/16/18 1706 03/17/18 1121   03/16/18 1300  ceFEPIme (MAXIPIME) 2 g in sodium chloride 0.9 % 100 mL IVPB     2 g 200 mL/hr over 30 Minutes Intravenous  Once 03/16/18 1235 03/16/18 1408   03/16/18 1300  linezolid (ZYVOX) IVPB 600 mg     600 mg 300 mL/hr over 60 Minutes Intravenous  Once  03/16/18 1255 03/16/18 1821      Procedures: None  CONSULTS:  None  Time spent: 25- minutes-Greater than 50% of this time was spent in counseling, explanation of diagnosis, planning of further management, and coordination of care.  MEDICATIONS: Scheduled Meds: . aspirin  81 mg Oral Daily  . atorvastatin  80 mg Oral q1800  . budesonide (PULMICORT) nebulizer solution  0.5 mg Nebulization BID  . clopidogrel  75 mg Oral Daily  . enoxaparin (LOVENOX) injection   40 mg Subcutaneous Q24H  . famotidine  20 mg Oral Daily  . feeding supplement (ENSURE ENLIVE)  237 mL Oral TID BM  . fluticasone  2 spray Each Nare Daily  . guaiFENesin  1,200 mg Oral BID  . insulin aspart  0-9 Units Subcutaneous TID WC  . ipratropium-albuterol  3 mL Nebulization BID  . multivitamin with minerals  1 tablet Oral Daily  . nicotine  21 mg Transdermal Daily  . nystatin  5 mL Oral QID  . pantoprazole  40 mg Oral Q0600  . polyethylene glycol  17 g Oral Daily  . pregabalin  75 mg Oral TID  . sodium chloride flush  3 mL Intravenous Q12H   Continuous Infusions: . ampicillin-sulbactam (UNASYN) IV 3 g (03/19/18 0631)   PRN Meds:.acetaminophen **OR** acetaminophen, hydrOXYzine, ipratropium-albuterol, ketorolac, magnesium citrate, oxyCODONE-acetaminophen, phenol, prochlorperazine, senna-docusate, sorbitol, traMADol   PHYSICAL EXAM: Vital signs: Vitals:   03/18/18 2229 03/19/18 0551 03/19/18 0709 03/19/18 1217  BP: 116/74 (!) 141/78  128/81  Pulse: (!) 109 89  89  Resp: 18 18  20   Temp: 97.6 F (36.4 C) 97.8 F (36.6 C)    TempSrc: Oral     SpO2: 98% 95% 98% 97%  Weight:  61.6 kg    Height:       Filed Weights   03/17/18 0439 03/18/18 0522 03/19/18 0551  Weight: 59.1 kg 61.5 kg 61.6 kg   Body mass index is 17.92 kg/m.   General appearance:Awake, alert, not in any distress.  Eyes:no scleral icterus. HEENT: Atraumatic and Normocephalic Neck: supple, no JVD. Resp:Good air entry bilaterally,no rales or rhonchi CVS: S1 S2 regular, no murmurs.  GI: Bowel sounds present, Non tender and not distended with no gaurding, rigidity or rebound. Extremities: Right AKA to touch Neurology:  Non focal Musculoskeletal:No digital cyanosis Skin:No Rash, warm and dry Wounds:N/A  I have personally reviewed following labs and imaging studies  LABORATORY DATA: CBC: Recent Labs  Lab 03/16/18 0918 03/17/18 0438 03/18/18 0323  WBC 16.3* 13.6* 10.3  NEUTROABS  --  10.6*  --    HGB 9.2* 7.8* 8.8*  HCT 30.1* 26.3* 30.1*  MCV 74.0* 73.9* 75.1*  PLT 433* 337 297    Basic Metabolic Panel: Recent Labs  Lab 03/16/18 0918 03/16/18 1857 03/17/18 0438 03/18/18 0323  NA 135  --  135 137  K 3.7  --  3.7 4.0  CL 101  --  103 104  CO2 24  --  20* 23  GLUCOSE 165*  --  133* 166*  BUN 9  --  8 11  CREATININE 0.78  --  0.75 0.88  CALCIUM 8.6*  --  7.9* 8.0*  MG  --  1.8  --   --   PHOS  --   --  4.3  --     GFR: Estimated Creatinine Clearance: 78.8 mL/min (by C-G formula based on SCr of 0.88 mg/dL).  Liver Function Tests: Recent Labs  Lab 03/16/18 [phone] 03/17/18 1194  AST 34 23  ALT 29 23  ALKPHOS 105 89  BILITOT 0.6 0.3  PROT 7.6 6.5  ALBUMIN 2.2* 1.8*   Recent Labs  Lab 03/16/18 0918  LIPASE 42   No results for input(s): AMMONIA in the last 168 hours.  Coagulation Profile: No results for input(s): INR, PROTIME in the last 168 hours.  Cardiac Enzymes: No results for input(s): CKTOTAL, CKMB, CKMBINDEX, TROPONINI in the last 168 hours.  BNP (last 3 results) No results for input(s): PROBNP in the last 8760 hours.  HbA1C: Recent Labs    03/17/18 0438  HGBA1C 7.3*    CBG: Recent Labs  Lab 03/18/18 1202 03/18/18 1732 03/18/18 2227 03/19/18 0753 03/19/18 1215  GLUCAP 184* 114* 133* 126* 155*    Lipid Profile: No results for input(s): CHOL, HDL, LDLCALC, TRIG, CHOLHDL, LDLDIRECT in the last 72 hours.  Thyroid Function Tests: No results for input(s): TSH, T4TOTAL, FREET4, T3FREE, THYROIDAB in the last 72 hours.  Anemia Panel: Recent Labs    03/16/18 1857 03/17/18 0438  VITAMINB12 559 449  FOLATE 10.3 11.8  FERRITIN 256 209  TIBC 175* 141*  IRON 12* 7*  RETICCTPCT 0.6  --     Urine analysis:    Component Value Date/Time   COLORURINE YELLOW 03/16/2018 1904   APPEARANCEUR CLEAR 03/16/2018 1904   LABSPEC 1.042 (H) 03/16/2018 1904   PHURINE 7.0 03/16/2018 1904   GLUCOSEU NEGATIVE 03/16/2018 1904   GLUCOSEU > 1000  mg/dL (A) 04/06/2008 2035   HGBUR NEGATIVE 03/16/2018 1904   BILIRUBINUR NEGATIVE 03/16/2018 1904   KETONESUR NEGATIVE 03/16/2018 1904   PROTEINUR NEGATIVE 03/16/2018 1904   UROBILINOGEN 1.0 11/21/2014 1600   NITRITE NEGATIVE 03/16/2018 1904   LEUKOCYTESUR NEGATIVE 03/16/2018 1904    Sepsis Labs: Lactic Acid, Venous    Component Value Date/Time   LATICACIDVEN 1.83 01/21/2018 1854    MICROBIOLOGY: Recent Results (from the past 240 hour(s))  Blood culture (routine x 2)     Status: Abnormal (Preliminary result)   Collection Time: 03/16/18  1:20 PM  Result Value Ref Range Status   Specimen Description BLOOD LEFT HAND  Final   Special Requests   Final    BOTTLES DRAWN AEROBIC ONLY Blood Culture results may not be optimal due to an inadequate volume of blood received in culture bottles   Culture  Setup Time   Final    AEROBIC BOTTLE ONLY GRAM POSITIVE COCCI CRITICAL RESULT CALLED TO, READ BACK BY AND VERIFIED WITH: L SEAY PHARMD 03/18/18 0105 JDW    Culture (A)  Final    STAPHYLOCOCCUS SPECIES (COAGULASE NEGATIVE) THE SIGNIFICANCE OF ISOLATING THIS ORGANISM FROM A SINGLE SET OF BLOOD CULTURES WHEN MULTIPLE SETS ARE DRAWN IS UNCERTAIN. PLEASE NOTIFY THE MICROBIOLOGY DEPARTMENT WITHIN ONE WEEK IF SPECIATION AND SENSITIVITIES ARE REQUIRED. Performed at [facility], [place] [address], [place], [place] [address]    Report Status PENDING  Incomplete  Blood Culture ID Panel (Reflexed)     Status: Abnormal   Collection Time: 03/16/18  1:20 PM  Result Value Ref Range Status   Enterococcus species NOT DETECTED NOT DETECTED Final   Listeria monocytogenes NOT DETECTED NOT DETECTED Final   Staphylococcus species DETECTED (A) NOT DETECTED Final    Comment: Methicillin (oxacillin) resistant coagulase negative staphylococcus. Possible blood culture contaminant (unless isolated from more than one blood culture draw or clinical case suggests pathogenicity). No antibiotic treatment is indicated  for blood  culture contaminants. CRITICAL RESULT CALLED TO, READ BACK BY  AND VERIFIED WITH: L SEAY PHARMD 03/18/18 0105 JDW    Staphylococcus aureus (BCID) NOT DETECTED NOT DETECTED Final   Methicillin resistance DETECTED (A) NOT DETECTED Final    Comment: CRITICAL RESULT CALLED TO, READ BACK BY AND VERIFIED WITH: L SEAY PHARMD 03/18/18 0105 JDW    Streptococcus species NOT DETECTED NOT DETECTED Final   Streptococcus agalactiae NOT DETECTED NOT DETECTED Final   Streptococcus pneumoniae NOT DETECTED NOT DETECTED Final   Streptococcus pyogenes NOT DETECTED NOT DETECTED Final   Acinetobacter baumannii NOT DETECTED NOT DETECTED Final   Enterobacteriaceae species NOT DETECTED NOT DETECTED Final   Enterobacter cloacae complex NOT DETECTED NOT DETECTED Final   Escherichia coli NOT DETECTED NOT DETECTED Final   Klebsiella oxytoca NOT DETECTED NOT DETECTED Final   Klebsiella pneumoniae NOT DETECTED NOT DETECTED Final   Proteus species NOT DETECTED NOT DETECTED Final   Serratia marcescens NOT DETECTED NOT DETECTED Final   Haemophilus influenzae NOT DETECTED NOT DETECTED Final   Neisseria meningitidis NOT DETECTED NOT DETECTED Final   Pseudomonas aeruginosa NOT DETECTED NOT DETECTED Final   Candida albicans NOT DETECTED NOT DETECTED Final   Candida glabrata NOT DETECTED NOT DETECTED Final   Candida krusei NOT DETECTED NOT DETECTED Final   Candida parapsilosis NOT DETECTED NOT DETECTED Final   Candida tropicalis NOT DETECTED NOT DETECTED Final    Comment: Performed at [facility], [place], [place], [place] [address]  Urine Culture     Status: None   Collection Time: 03/16/18  4:29 PM  Result Value Ref Range Status   Specimen Description URINE, CLEAN CATCH  Final   Special Requests NONE  Final   Culture   Final    NO GROWTH Performed at [facility], [place] [address], [place], [place] [address]    Report Status 03/17/2018 FINAL  Final  MRSA PCR Screening     Status: None    Collection Time: 03/16/18  5:40 PM  Result Value Ref Range Status   MRSA by PCR NEGATIVE NEGATIVE Final    Comment:        The GeneXpert MRSA Assay (FDA approved for NASAL specimens only), is one component of a comprehensive MRSA colonization surveillance program. It is not intended to diagnose MRSA infection nor to guide or monitor treatment for MRSA infections. Performed at [facility], [place] [address] [place], [place] [address]   Blood culture (routine x 2)     Status: None (Preliminary result)   Collection Time: 03/16/18  6:58 PM  Result Value Ref Range Status   Specimen Description BLOOD LEFT ANTECUBITAL  Final   Special Requests   Final    BOTTLES DRAWN AEROBIC AND ANAEROBIC Blood Culture adequate volume   Culture   Final    NO GROWTH 3 DAYS Performed at [facility], [place] [address] [place],  [address]    Report Status PENDING  Incomplete    RADIOLOGY STUDIES/RESULTS: Dg Chest 2 View  Result Date: 02/26/2018 CLINICAL DATA:  Cough productive of green mucus, shortness of breath, and central chest pain since yesterday, LEFT femur surgery 3 weeks ago, history hypertension, diabetes mellitus, COPD, CHF, asthma, pulmonary embolism, TB exposure EXAM: CHEST - 2 VIEW COMPARISON:  None FINDINGS: Normal heart size, mediastinal contours, and pulmonary vascularity. Atherosclerotic calcification aorta. Emphysematous changes with focal LEFT lower lobe consolidation consistent with pneumonia. Minimal subsegmental atelectasis at RIGHT base. Remaining lungs clear. No pleural effusion or pneumothorax. Multiple old fractures  of LEFT ribs and LEFT clavicle. Osseous demineralization. IMPRESSION: COPD changes with LEFT lower lobe pneumonia and mild RIGHT basilar atelectasis. Electronically Signed   By: Lavonia Dana M.D.   On: 02/26/2018 17:15   Ct Chest W Contrast  Result Date: 03/16/2018 CLINICAL DATA:  Mass-like consolidation in the left lower lobe on recent chest radiographs  and abdomen and pelvis CT. The patient has had worsening nausea and vomiting and at the time of the chest radiographs had a productive cough, shortness of breath and central chest pain. History of COPD, asthma, pulmonary embolism and TB exposure. The patient is a current smoker, smoking 2 packs of cigarettes per day with a 104 pack-year history of smoking. EXAM: CT CHEST WITH CONTRAST TECHNIQUE: Multidetector CT imaging of the chest was performed during intravenous contrast administration. CONTRAST:  47mL OMNIPAQUE IOHEXOL 300 MG/ML  SOLN COMPARISON:  Chest radiographs dated 02/26/2018 and abdomen and pelvis CT dated 03/16/2018. FINDINGS: Cardiovascular: Atheromatous calcifications, including the coronary arteries and aorta. Normal sized heart. Mediastinum/Nodes: Multiple enlarged mediastinal and bilateral hilar lymph nodes, greatest in the left hilar region. These include a 12 mm short axis AP window node on image number 62 series 4, 10 mm short axis right anterior paratracheal node on image number 43 series 4, 14 mm short axis subcarinal node on image number 68 series 4, 12 mm short axis right hilar node on image number 70 series 4 and a 17 mm short axis left hilar node on image number 86 series 4. Unremarkable thyroid gland and esophagus. Lungs/Pleura: Patchy and dense, mass-like airspace consolidation in the posterior aspect of the left lower lobe with a prominent oval central low density component measuring 6.1 x 4.2 cm on image number 99 series 4. There is also mucous plugging in multiple left lower lobe bronchi. This is relatively low density with small amount of interspersed air. Mild diffuse peribronchial thickening and mild centrilobular bullous changes. Small amount of bilateral linear atelectasis or scarring. No pleural fluid. Upper Abdomen: Atheromatous arterial calcifications without aneurysm. Mild diffuse low density of the liver relative to the spleen. Musculoskeletal: Old, healed bilateral rib  fractures. Thoracic and lower cervical spine degenerative changes. IMPRESSION: 1. 6.1 x 4.2 cm mass-like area of consolidation in the posterior aspect of the left lower lobe with a central low density component. This has an appearance most compatible with necrotizing pneumonia and possible lung abscess. An underlying cavitary neoplasm is less likely but not excluded. 2. Mucous plugging involving multiple left lower lobe bronchi. 3. Mild mediastinal and bilateral hilar adenopathy, most likely reactive. Metastatic adenopathy is also a possibility. 4.  Calcific coronary artery and aortic atherosclerosis. 5. Mild changes of COPD and chronic bronchitis. 6. Mild diffuse hepatic steatosis. Aortic Atherosclerosis (ICD10-I70.0) and Emphysema (ICD10-J43.9). Electronically Signed   By: Claudie Revering M.D.   On: 03/16/2018 14:54   Ct Abdomen Pelvis W Contrast  Result Date: 03/16/2018 CLINICAL DATA:  Nausea, vomiting, worsening EXAM: CT ABDOMEN AND PELVIS WITH CONTRAST TECHNIQUE: Multidetector CT imaging of the abdomen and pelvis was performed using the standard protocol following bolus administration of intravenous contrast. CONTRAST:  125mL OMNIPAQUE IOHEXOL 300 MG/ML  SOLN COMPARISON:  Chest radiograph 02/26/2018 FINDINGS: Lower chest: There is a partially included very dense, masslike consolidation of the left lower lobe, in keeping with findings of prior radiograph (series 3, image 1). Hepatobiliary: No focal liver abnormality is seen. No gallstones, gallbladder wall thickening, or biliary dilatation. Pancreas: Unremarkable. No pancreatic ductal dilatation or surrounding inflammatory changes. Spleen:  Splenomegaly, maximum span approximately 15.9 cm. Adrenals/Urinary Tract: Adrenal glands are unremarkable. Kidneys are normal, without renal calculi, focal lesion, or hydronephrosis. Bladder is unremarkable. Stomach/Bowel: Stomach is within normal limits. Appendix not clearly visualized. No evidence of bowel wall thickening,  distention, or inflammatory changes. Large burden of stool in the colon. Vascular/Lymphatic: Severe mixed calcific atherosclerosis of the abdominal aorta and branch vessels, with a left common iliac artery stent, which appears patent. 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 No enlarged abdominal or pelvic lymph nodes. Reproductive: No mass or other abnormality. Other: No abdominal wall hernia or abnormality. No abdominopelvic ascites. Musculoskeletal: No acute or significant osseous findings. IMPRESSION: 1. There is a partially included very dense, masslike consolidation of the left lower lobe, in keeping with findings of prior radiograph (series 3, image 1). Findings are concerning for infection or aspiration, particularly necrotizing infection given the appearance. Underlying mass is not excluded and at minimum, follow-up CT or radiographs are warranted at 6-8 weeks to document complete resolution. 2.  Splenomegaly. 3. 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 Recommend vascular evaluation. Electronically Signed   By: Eddie Candle M.D.   On: 03/16/2018 11:56   Dg Abd Acute 2+v W 1v Chest  Result Date: 03/02/2018 CLINICAL DATA:  Cough nausea and vomiting EXAM: DG ABDOMEN ACUTE W/ 1V CHEST COMPARISON:  CT 01/21/2018, chest x-ray 01/21/2018 FINDINGS: Single-view chest demonstrates left perihilar airspace disease. Normal heart size. Old left upper rib deformities. Aortic atherosclerosis. Supine and decubitus views of the abdomen demonstrate no free air. Nonobstructed bowel-gas pattern with moderate stool. Left iliac stent. Clips in the right groin. Surgical hardware in the left femur. Faint linear calcifications in the  region of the renal hilus, likely reflecting intrarenal vascular calcification. IMPRESSION: 1. Left perihilar airspace disease concerning for a pneumonia 2. Nonobstructed bowel-gas pattern Electronically Signed   By: Donavan Foil M.D.   On: 03/02/2018 23:50     LOS: 3 days   Oren Binet, MD  [facility]  If 7PM-7AM, please contact night-coverage  Please page via [email or web address]  Go to [email or web address] and use [facility]'s universal password to access. If you do not have the password, please contact the hospital operator.  Locate the [facility] provider you are looking for under [facility] and page to a number that you can be directly reached. If you still have difficulty reaching the provider, please page the [facility] (Director on Call) for the Hospitalists listed on amion for assistance.  03/19/2018, 1:27 PM

## 2018-03-20 LAB — BASIC METABOLIC PANEL
Anion gap: 7 (ref 5–15)
BUN: 12 mg/dL (ref 6–20)
CO2: 26 mmol/L (ref 22–32)
CREATININE: 0.85 mg/dL (ref 0.61–1.24)
Calcium: 8.4 mg/dL — ABNORMAL LOW (ref 8.9–10.3)
Chloride: 102 mmol/L (ref 98–111)
GFR calc Af Amer: >60 mL/min (ref >60)
GFR calc non Af Amer: >60 mL/min (ref >60)
Glucose, Bld: 115 mg/dL — ABNORMAL HIGH (ref 70–99)
Potassium: 5 mmol/L (ref 3.5–5.1)
Sodium: 135 mmol/L (ref 135–145)

## 2018-03-20 LAB — CBC
HCT: 28.7 % — ABNORMAL LOW (ref 39.0–52.0)
Hemoglobin: 8.7 g/dL — ABNORMAL LOW (ref 13.0–17.0)
MCH: 22.6 pg — ABNORMAL LOW (ref 26.0–34.0)
MCHC: 30.3 g/dL (ref 30.0–36.0)
MCV: 74.5 fL — ABNORMAL LOW (ref 80.0–100.0)
PLATELETS: 410 10*3/uL — AB (ref 150–400)
RBC: 3.85 MIL/uL — ABNORMAL LOW (ref 4.22–5.81)
RDW: 18 % — ABNORMAL HIGH (ref 11.5–15.5)
WBC: 10.6 10*3/uL — ABNORMAL HIGH (ref 4.0–10.5)
nRBC: 0 % (ref 0.0–0.2)

## 2018-03-20 LAB — GLUCOSE, CAPILLARY
Glucose-Capillary: 190 mg/dL — ABNORMAL HIGH (ref 70–99)
Glucose-Capillary: 162 mg/dL — ABNORMAL HIGH (ref 70–99)
Glucose-Capillary: 121 mg/dL — ABNORMAL HIGH (ref 70–99)
Glucose-Capillary: 138 mg/dL — ABNORMAL HIGH (ref 70–99)

## 2018-03-20 NOTE — Plan of Care (Signed)
  Problem: Education: Goal: Knowledge of General Education information will improve Description Including pain rating scale, medication(s)/side effects and non-pharmacologic comfort measures Outcome: Progressing   Problem: Clinical Measurements: Goal: Cardiovascular complication will be avoided Outcome: Progressing   Problem: Clinical Measurements: Goal: Respiratory complications will improve Outcome: Progressing

## 2018-03-20 NOTE — Progress Notes (Signed)
PROGRESS NOTE        PATIENT DETAILS Name: [name] Age: 58 y.o. Sex: male Date of Birth: Sep 27, 1960 Admit Date: 03/16/2018 Admitting Physician Eugenie Filler, MD EUM:PNTI, Ander Gaster, MD  Brief Narrative: Patient is a 58 y.o. 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  See below for further details.  Subjective: No major complaints-awake and alert-lying comfortably in bed.  Denies any chest pain or shortness of breath.  No vomiting.  Assessment/Plan: Necrotizing pneumonia of left lung: Slowly improving-afebrile-leukocytosis has resolved.  1/2 blood culture positive for MRSE likely contamination.  ID following-remains on IV Unasyn-we will plan to transition to Augmentin on discharge.   Oral thrush: Continue nystatin  Anemia: Appears to be microcytic-ferritin levels are normal-suspect has anemia of chronic disease-worsened by acute illness.  Hemoglobin stable at 8.7.  Follow periodically.    Dehydration: Volume status improved-now euvolemic.  Off all IV fluids.  DM-2: CBGs stable-continue SSI  COPD/asthma: Some scattered rhonchi but otherwise clear-no evidence of exacerbation-continue bronchodilators.  GERD: Continue PPI  CVA/PAD s/p right AKA: Continue aspirin/Plavix and statin  OSA: CPAP nightly  Severe protein calorie malnutrition: Continue supplements  Tobacco/cocaine use: Counseled-not sure if he has any indication of quitting at this point.  Chronic pain syndrome: Continue Lyrica and as needed tramadol  Debility/deconditioning: Secondary to necrotizing pneumonia of the left lung, dehydration-we will ask social work to see if patient can be transferred to SNF discharge.  DVT  Prophylaxis: Prophylactic Lovenox   Code Status: Full code   Family Communication: None at bedside  Disposition Plan: Remain inpatient-SNF versus home health services on discharge over the next few days  Antimicrobial agents: Anti-infectives (From admission, onward)   Start     Dose/Rate Route Frequency Ordered Stop   03/17/18 1700  fluconazole (DIFLUCAN) IVPB 100 mg  Status:  Discontinued     100 mg 50 mL/hr over 60 Minutes Intravenous Every 24 hours 03/16/18 1706 03/17/18 1121   03/17/18 0500  linezolid (ZYVOX) IVPB 600 mg  Status:  Discontinued     600 mg 300 mL/hr over 60 Minutes Intravenous Every 12 hours 03/16/18 1703 03/17/18 0923   03/16/18 2130  ceFEPIme (MAXIPIME) 1 g in sodium chloride 0.9 % 100 mL IVPB  Status:  Discontinued     1 g 200 mL/hr over 30 Minutes Intravenous Every 8 hours 03/16/18 1626 03/16/18 1705   03/16/18 1930  Ampicillin-Sulbactam (UNASYN) 3 g in sodium chloride 0.9 % 100 mL IVPB     3 g 200 mL/hr over 30 Minutes Intravenous Every 6 hours 03/16/18 1724     03/16/18 1715  fluconazole (DIFLUCAN) IVPB 200 mg  Status:  Discontinued     200 mg 100 mL/hr over 60 Minutes Intravenous  Once 03/16/18 1706 03/17/18 1121   03/16/18 1300  ceFEPIme (MAXIPIME) 2 g in sodium chloride 0.9 % 100 mL IVPB     2 g 200 mL/hr over 30 Minutes Intravenous  Once 03/16/18 1235 03/16/18 1408   03/16/18 1300  linezolid (ZYVOX) IVPB 600 mg     600 mg 300 mL/hr over 60 Minutes Intravenous  Once 03/16/18 1255  03/16/18 1821      Procedures: None  CONSULTS:  None  Time spent: 25- minutes-Greater than 50% of this time was spent in counseling, explanation of diagnosis, planning of further management, and coordination of care.  MEDICATIONS: Scheduled Meds: . aspirin  81 mg Oral Daily  . atorvastatin  80 mg Oral q1800  . budesonide (PULMICORT) nebulizer solution  0.5 mg Nebulization BID  . clopidogrel  75 mg Oral Daily  . enoxaparin (LOVENOX) injection  40 mg  Subcutaneous Q24H  . famotidine  20 mg Oral Daily  . feeding supplement (ENSURE ENLIVE)  237 mL Oral TID BM  . fluticasone  2 spray Each Nare Daily  . guaiFENesin  1,200 mg Oral BID  . insulin aspart  0-9 Units Subcutaneous TID WC  . ipratropium-albuterol  3 mL Nebulization BID  . multivitamin with minerals  1 tablet Oral Daily  . nicotine  21 mg Transdermal Daily  . nystatin  5 mL Oral QID  . pantoprazole  40 mg Oral Q0600  . polyethylene glycol  17 g Oral Daily  . pregabalin  75 mg Oral TID  . sodium chloride flush  3 mL Intravenous Q12H   Continuous Infusions: . ampicillin-sulbactam (UNASYN) IV 3 g (03/20/18 0552)   PRN Meds:.acetaminophen **OR** acetaminophen, hydrOXYzine, ipratropium-albuterol, ketorolac, magnesium citrate, oxyCODONE-acetaminophen, phenol, prochlorperazine, senna-docusate, sorbitol, traMADol   PHYSICAL EXAM: Vital signs: Vitals:   03/19/18 2051 03/20/18 0329 03/20/18 0418 03/20/18 0708  BP: (!) 141/80  (!) 156/77   Pulse: 95  93   Resp: 15  18   Temp: 98 F (36.7 C)  99.2 F (37.3 C)   TempSrc: Oral  Oral   SpO2: 98%  99% 93%  Weight:  61 kg    Height:       Filed Weights   03/18/18 0522 03/19/18 0551 03/20/18 0329  Weight: 61.5 kg 61.6 kg 61 kg   Body mass index is 17.74 kg/m.   General appearance:Awake, alert, not in any distress.  Eyes:no scleral icterus. HEENT: Atraumatic and Normocephalic Neck: supple, no JVD. Resp:Good air entry bilaterally, no added sounds heard anteriorly CVS: S1 S2 regular, no murmurs.  GI: Bowel sounds present, Non tender and not distended with no gaurding, rigidity or rebound. Extremities: Right AKA Neurology:  Non focal Psychiatric: Normal judgment and insight. Normal mood. Musculoskeletal:No digital cyanosis Skin:No Rash, warm and dry Wounds:N/A  I have personally reviewed following labs and imaging studies  LABORATORY DATA: CBC: Recent Labs  Lab 03/16/18 0918 03/17/18 0438 03/18/18 0323  03/20/18 0529  WBC 16.3* 13.6* 10.3 10.6*  NEUTROABS  --  10.6*  --   --   HGB 9.2* 7.8* 8.8* 8.7*  HCT 30.1* 26.3* 30.1* 28.7*  MCV 74.0* 73.9* 75.1* 74.5*  PLT 433* 337 379 410*    Basic Metabolic Panel: Recent Labs  Lab 03/16/18 0918 03/16/18 1857 03/17/18 0438 03/18/18 0323 03/20/18 0529  NA 135  --  135 137 135  K 3.7  --  3.7 4.0 5.0  CL 101  --  103 104 102  CO2 24  --  20* 23 26  GLUCOSE 165*  --  133* 166* 115*  BUN 9  --  8 11 12   CREATININE 0.78  --  0.75 0.88 0.85  CALCIUM 8.6*  --  7.9* 8.0* 8.4*  MG  --  1.8  --   --   --   PHOS  --   --  4.3  --   --  GFR: Estimated Creatinine Clearance: 80.7 mL/min (by C-G formula based on SCr of 0.85 mg/dL).  Liver Function Tests: Recent Labs  Lab 03/16/18 0918 03/17/18 0438  AST 34 23  ALT 29 23  ALKPHOS 105 89  BILITOT 0.6 0.3  PROT 7.6 6.5  ALBUMIN 2.2* 1.8*   Recent Labs  Lab 03/16/18 0918  LIPASE 42   No results for input(s): AMMONIA in the last 168 hours.  Coagulation Profile: No results for input(s): INR, PROTIME in the last 168 hours.  Cardiac Enzymes: No results for input(s): CKTOTAL, CKMB, CKMBINDEX, TROPONINI in the last 168 hours.  BNP (last 3 results) No results for input(s): PROBNP in the last 8760 hours.  HbA1C: No results for input(s): HGBA1C in the last 72 hours.  CBG: Recent Labs  Lab 03/19/18 0753 03/19/18 1215 03/19/18 1716 03/19/18 2052 03/20/18 0752  GLUCAP 126* 155* 131* 162* 190*    Lipid Profile: No results for input(s): CHOL, HDL, LDLCALC, TRIG, CHOLHDL, LDLDIRECT in the last 72 hours.  Thyroid Function Tests: No results for input(s): TSH, T4TOTAL, FREET4, T3FREE, THYROIDAB in the last 72 hours.  Anemia Panel: No results for input(s): VITAMINB12, FOLATE, FERRITIN, TIBC, IRON, RETICCTPCT in the last 72 hours.  Urine analysis:    Component Value Date/Time   COLORURINE YELLOW 03/16/2018 1904   APPEARANCEUR CLEAR 03/16/2018 1904   LABSPEC 1.042 (H)  03/16/2018 1904   PHURINE 7.0 03/16/2018 1904   GLUCOSEU NEGATIVE 03/16/2018 1904   GLUCOSEU > 1000 mg/dL (A) 04/06/2008 2035   HGBUR NEGATIVE 03/16/2018 1904   BILIRUBINUR NEGATIVE 03/16/2018 1904   KETONESUR NEGATIVE 03/16/2018 1904   PROTEINUR NEGATIVE 03/16/2018 1904   UROBILINOGEN 1.0 11/21/2014 1600   NITRITE NEGATIVE 03/16/2018 1904   LEUKOCYTESUR NEGATIVE 03/16/2018 1904    Sepsis Labs: Lactic Acid, Venous    Component Value Date/Time   LATICACIDVEN 1.83 01/21/2018 1854    MICROBIOLOGY: Recent Results (from the past 240 hour(s))  Blood culture (routine x 2)     Status: Abnormal (Preliminary result)   Collection Time: 03/16/18  1:20 PM  Result Value Ref Range Status   Specimen Description BLOOD LEFT HAND  Final   Special Requests   Final    BOTTLES DRAWN AEROBIC ONLY Blood Culture results may not be optimal due to an inadequate volume of blood received in culture bottles   Culture  Setup Time   Final    AEROBIC BOTTLE ONLY GRAM POSITIVE COCCI CRITICAL RESULT CALLED TO, READ BACK BY AND VERIFIED WITH: L SEAY PHARMD 03/18/18 0105 JDW    Culture (A)  Final    STAPHYLOCOCCUS SPECIES (COAGULASE NEGATIVE) THE SIGNIFICANCE OF ISOLATING THIS ORGANISM FROM A SINGLE SET OF BLOOD CULTURES WHEN MULTIPLE SETS ARE DRAWN IS UNCERTAIN. PLEASE NOTIFY THE MICROBIOLOGY DEPARTMENT WITHIN ONE WEEK IF SPECIATION AND SENSITIVITIES ARE REQUIRED. Performed at [facility], [place] [address] [place], [place] [address]    Report Status PENDING  Incomplete  Blood Culture ID Panel (Reflexed)     Status: Abnormal   Collection Time: 03/16/18  1:20 PM  Result Value Ref Range Status   Enterococcus species NOT DETECTED NOT DETECTED Final   Listeria monocytogenes NOT DETECTED NOT DETECTED Final   Staphylococcus species DETECTED (A) NOT DETECTED Final    Comment: Methicillin (oxacillin) resistant coagulase negative staphylococcus. Possible blood culture contaminant (unless isolated from more than  one blood culture draw or clinical case suggests pathogenicity). No antibiotic treatment is indicated for blood  culture contaminants. CRITICAL RESULT CALLED  TO, READ BACK BY AND VERIFIED WITH: L SEAY PHARMD 03/18/18 0105 JDW    Staphylococcus aureus (BCID) NOT DETECTED NOT DETECTED Final   Methicillin resistance DETECTED (A) NOT DETECTED Final    Comment: CRITICAL RESULT CALLED TO, READ BACK BY AND VERIFIED WITH: L SEAY PHARMD 03/18/18 0105 JDW    Streptococcus species NOT DETECTED NOT DETECTED Final   Streptococcus agalactiae NOT DETECTED NOT DETECTED Final   Streptococcus pneumoniae NOT DETECTED NOT DETECTED Final   Streptococcus pyogenes NOT DETECTED NOT DETECTED Final   Acinetobacter baumannii NOT DETECTED NOT DETECTED Final   Enterobacteriaceae species NOT DETECTED NOT DETECTED Final   Enterobacter cloacae complex NOT DETECTED NOT DETECTED Final   Escherichia coli NOT DETECTED NOT DETECTED Final   Klebsiella oxytoca NOT DETECTED NOT DETECTED Final   Klebsiella pneumoniae NOT DETECTED NOT DETECTED Final   Proteus species NOT DETECTED NOT DETECTED Final   Serratia marcescens NOT DETECTED NOT DETECTED Final   Haemophilus influenzae NOT DETECTED NOT DETECTED Final   Neisseria meningitidis NOT DETECTED NOT DETECTED Final   Pseudomonas aeruginosa NOT DETECTED NOT DETECTED Final   Candida albicans NOT DETECTED NOT DETECTED Final   Candida glabrata NOT DETECTED NOT DETECTED Final   Candida krusei NOT DETECTED NOT DETECTED Final   Candida parapsilosis NOT DETECTED NOT DETECTED Final   Candida tropicalis NOT DETECTED NOT DETECTED Final    Comment: Performed at [facility], [place]. [address] [place], [place] [address]  Urine Culture     Status: None   Collection Time: 03/16/18  4:29 PM  Result Value Ref Range Status   Specimen Description URINE, CLEAN CATCH  Final   Special Requests NONE  Final   Culture   Final    NO GROWTH Performed at [facility], [place] [address]  [place], [place] [address]    Report Status 03/17/2018 FINAL  Final  MRSA PCR Screening     Status: None   Collection Time: 03/16/18  5:40 PM  Result Value Ref Range Status   MRSA by PCR NEGATIVE NEGATIVE Final    Comment:        The GeneXpert MRSA Assay (FDA approved for NASAL specimens only), is one component of a comprehensive MRSA colonization surveillance program. It is not intended to diagnose MRSA infection nor to guide or monitor treatment for MRSA infections. Performed at [facility], [place] [address] [place], [place] [address]   Blood culture (routine x 2)     Status: None (Preliminary result)   Collection Time: 03/16/18  6:58 PM  Result Value Ref Range Status   Specimen Description BLOOD LEFT ANTECUBITAL  Final   Special Requests   Final    BOTTLES DRAWN AEROBIC AND ANAEROBIC Blood Culture adequate volume   Culture   Final    NO GROWTH 4 DAYS Performed at [facility], [place] [address] [place], [place] [address]    Report Status PENDING  Incomplete    RADIOLOGY STUDIES/RESULTS: Dg Chest 2 View  Result Date: 02/26/2018 CLINICAL DATA:  Cough productive of green mucus, shortness of breath, and central chest pain since yesterday, LEFT femur surgery 3 weeks ago, history hypertension, diabetes mellitus, COPD, CHF, asthma, pulmonary embolism, TB exposure EXAM: CHEST - 2 VIEW COMPARISON:  None FINDINGS: Normal heart size, mediastinal contours, and pulmonary vascularity. Atherosclerotic calcification aorta. Emphysematous changes with focal LEFT lower lobe consolidation consistent with pneumonia. Minimal subsegmental atelectasis at RIGHT base. Remaining lungs clear. No pleural effusion or  pneumothorax. Multiple old fractures of LEFT ribs and LEFT clavicle. Osseous demineralization. IMPRESSION: COPD changes with LEFT lower lobe pneumonia and mild RIGHT basilar atelectasis. Electronically Signed   By: Lavonia Dana M.D.   On: 02/26/2018 17:15   Ct Chest W Contrast  Result  Date: 03/16/2018 CLINICAL DATA:  Mass-like consolidation in the left lower lobe on recent chest radiographs and abdomen and pelvis CT. The patient has had worsening nausea and vomiting and at the time of the chest radiographs had a productive cough, shortness of breath and central chest pain. History of COPD, asthma, pulmonary embolism and TB exposure. The patient is a current smoker, smoking 2 packs of cigarettes per day with a 104 pack-year history of smoking. EXAM: CT CHEST WITH CONTRAST TECHNIQUE: Multidetector CT imaging of the chest was performed during intravenous contrast administration. CONTRAST:  70mL OMNIPAQUE IOHEXOL 300 MG/ML  SOLN COMPARISON:  Chest radiographs dated 02/26/2018 and abdomen and pelvis CT dated 03/16/2018. FINDINGS: Cardiovascular: Atheromatous calcifications, including the coronary arteries and aorta. Normal sized heart. Mediastinum/Nodes: Multiple enlarged mediastinal and bilateral hilar lymph nodes, greatest in the left hilar region. These include a 12 mm short axis AP window node on image number 62 series 4, 10 mm short axis right anterior paratracheal node on image number 43 series 4, 14 mm short axis subcarinal node on image number 68 series 4, 12 mm short axis right hilar node on image number 70 series 4 and a 17 mm short axis left hilar node on image number 86 series 4. Unremarkable thyroid gland and esophagus. Lungs/Pleura: Patchy and dense, mass-like airspace consolidation in the posterior aspect of the left lower lobe with a prominent oval central low density component measuring 6.1 x 4.2 cm on image number 99 series 4. There is also mucous plugging in multiple left lower lobe bronchi. This is relatively low density with small amount of interspersed air. Mild diffuse peribronchial thickening and mild centrilobular bullous changes. Small amount of bilateral linear atelectasis or scarring. No pleural fluid. Upper Abdomen: Atheromatous arterial calcifications without aneurysm.  Mild diffuse low density of the liver relative to the spleen. Musculoskeletal: Old, healed bilateral rib fractures. Thoracic and lower cervical spine degenerative changes. IMPRESSION: 1. 6.1 x 4.2 cm mass-like area of consolidation in the posterior aspect of the left lower lobe with a central low density component. This has an appearance most compatible with necrotizing pneumonia and possible lung abscess. An underlying cavitary neoplasm is less likely but not excluded. 2. Mucous plugging involving multiple left lower lobe bronchi. 3. Mild mediastinal and bilateral hilar adenopathy, most likely reactive. Metastatic adenopathy is also a possibility. 4.  Calcific coronary artery and aortic atherosclerosis. 5. Mild changes of COPD and chronic bronchitis. 6. Mild diffuse hepatic steatosis. Aortic Atherosclerosis (ICD10-I70.0) and Emphysema (ICD10-J43.9). Electronically Signed   By: Claudie Revering M.D.   On: 03/16/2018 14:54   Ct Abdomen Pelvis W Contrast  Result Date: 03/16/2018 CLINICAL DATA:  Nausea, vomiting, worsening EXAM: CT ABDOMEN AND PELVIS WITH CONTRAST TECHNIQUE: Multidetector CT imaging of the abdomen and pelvis was performed using the standard protocol following bolus administration of intravenous contrast. CONTRAST:  173mL OMNIPAQUE IOHEXOL 300 MG/ML  SOLN COMPARISON:  Chest radiograph 02/26/2018 FINDINGS: Lower chest: There is a partially included very dense, masslike consolidation of the left lower lobe, in keeping with findings of prior radiograph (series 3, image 1). Hepatobiliary: No focal liver abnormality is seen. No gallstones, gallbladder wall thickening, or biliary dilatation. Pancreas: Unremarkable. No pancreatic ductal dilatation or  surrounding inflammatory changes. Spleen: Splenomegaly, maximum span approximately 15.9 cm. Adrenals/Urinary Tract: Adrenal glands are unremarkable. Kidneys are normal, without renal calculi, focal lesion, or hydronephrosis. Bladder is unremarkable. Stomach/Bowel:  Stomach is within normal limits. Appendix not clearly visualized. No evidence of bowel wall thickening, distention, or inflammatory changes. Large burden of stool in the colon. Vascular/Lymphatic: Severe mixed calcific atherosclerosis of the abdominal aorta and branch vessels, with a left common iliac artery stent, which appears patent. 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 No enlarged abdominal or pelvic lymph nodes. Reproductive: No mass or other abnormality. Other: No abdominal wall hernia or abnormality. No abdominopelvic ascites. Musculoskeletal: No acute or significant osseous findings. IMPRESSION: 1. There is a partially included very dense, masslike consolidation of the left lower lobe, in keeping with findings of prior radiograph (series 3, image 1). Findings are concerning for infection or aspiration, particularly necrotizing infection given the appearance. Underlying mass is not excluded and at minimum, follow-up CT or radiographs are warranted at 6-8 weeks to document complete resolution. 2.  Splenomegaly. 3. 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 Recommend vascular evaluation. Electronically Signed   By: Eddie Candle M.D.   On: 03/16/2018 11:56   Dg Abd Acute 2+v W 1v Chest  Result Date: 03/02/2018 CLINICAL DATA:  Cough nausea and vomiting EXAM: DG ABDOMEN ACUTE W/ 1V CHEST COMPARISON:  CT 01/21/2018, chest x-ray 01/21/2018 FINDINGS: Single-view chest demonstrates left perihilar airspace disease. Normal heart size. Old left upper rib deformities. Aortic atherosclerosis. Supine and decubitus views of the abdomen demonstrate no free air. Nonobstructed bowel-gas pattern with moderate stool. Left iliac stent.  Clips in the right groin. Surgical hardware in the left femur. Faint linear calcifications in the region of the renal hilus, likely reflecting intrarenal vascular calcification. IMPRESSION: 1. Left perihilar airspace disease concerning for a pneumonia 2. Nonobstructed bowel-gas pattern Electronically Signed   By: Donavan Foil M.D.   On: 03/02/2018 23:50     LOS: 4 days   Oren Binet, MD  [facility]  If 7PM-7AM, please contact night-coverage  Please page via [email or web address]  Go to [email or web address] and use [facility]'s universal password to access. If you do not have the password, please contact the hospital operator.  Locate the [facility] provider you are looking for under [facility] and page to a number that you can be directly reached. If you still have difficulty reaching the provider, please page the [facility] (Director on Call) for the Hospitalists listed on amion for assistance.  03/20/2018, 10:24 AM

## 2018-03-21 DIAGNOSIS — Z89511 Acquired absence of right leg below knee: Secondary | ICD-10-CM

## 2018-03-21 LAB — CULTURE, BLOOD (ROUTINE X 2)
Culture: NO GROWTH
Special Requests: ADEQUATE

## 2018-03-21 LAB — GLUCOSE, CAPILLARY
Glucose-Capillary: 153 mg/dL — ABNORMAL HIGH (ref 70–99)
Glucose-Capillary: 135 mg/dL — ABNORMAL HIGH (ref 70–99)
Glucose-Capillary: 155 mg/dL — ABNORMAL HIGH (ref 70–99)
Glucose-Capillary: 157 mg/dL — ABNORMAL HIGH (ref 70–99)

## 2018-03-21 MED ORDER — AMOXICILLIN-POT CLAVULANATE 875-125 MG PO TABS: 1.0000 | ORAL_TABLET | Freq: Two times a day (BID) | ORAL | 0 refills | Status: AC

## 2018-03-21 MED FILL — AMOX-CLAV 875-125 MG TABLET: 875-125 | 28 days supply | Qty: 56 | Fill #0

## 2018-03-21 NOTE — Progress Notes (Signed)
[facility] for Infectious Disease  Date of Admission:  03/16/2018     Total days of antibiotics 6         ASSESSMENT/PLAN  [name] has necrotizing pneumonia and mass like consolidation on CT scan. Respiratory and GI symptoms have resolved and [name] has remained afebrile with stable WBC count. Clinically [name] looks significantly improved. Plan to treat with 4 weeks of Augmentin at discharge with follow up in ID clinic in 3 weeks.   1. Continue current dose of Unasyn. 2. Change to Augmentin 800/125 bid for 4 weeks at discharge. 3. Follow up in the ID office on 3/24 at 9:30am.   ID will sign off and be available as needed during remainder of hospital stay.    Principal Problem:   Necrotizing pneumonia ([place]) Active Problems:   Type 2 diabetes mellitus (HCC)   Dyslipidemia   Anxiety state   SLEEP APNEA, OBSTRUCTIVE, MODERATE   HYPERTENSION, BENIGN ESSENTIAL   Peripheral vascular disease (HCC)   COPD (chronic obstructive pulmonary disease) (HCC)   Gastroesophageal reflux disease   Chronic pain in left foot   Tobacco abuse   Protein-calorie malnutrition, severe (HCC)   History of pulmonary embolism   Polysubstance abuse (HCC)   Microcytic anemia   Diabetes mellitus type 2 in nonobese (HCC)   Thrush, oral   Acute dehydration   Malnutrition of moderate degree   . aspirin  81 mg Oral Daily  . atorvastatin  80 mg Oral q1800  . budesonide (PULMICORT) nebulizer solution  0.5 mg Nebulization BID  . clopidogrel  75 mg Oral Daily  . enoxaparin (LOVENOX) injection  40 mg Subcutaneous Q24H  . famotidine  20 mg Oral Daily  . feeding supplement (ENSURE ENLIVE)  237 mL Oral TID BM  . fluticasone  2 spray Each Nare Daily  . guaiFENesin  1,200 mg Oral BID  . insulin aspart  0-9 Units Subcutaneous TID WC  . multivitamin with minerals  1 tablet Oral Daily  . nicotine  21 mg Transdermal Daily  . nystatin  5 mL Oral QID  . pantoprazole  40 mg Oral Q0600  . polyethylene glycol  17 g  Oral Daily  . pregabalin  75 mg Oral TID  . sodium chloride flush  3 mL Intravenous Q12H    SUBJECTIVE:  Afebrile overnight with no acute events. Feeling better. No shortness of breath, nausea, or vomiting. Nervous about going home.   Allergies  Allergen Reactions  . Buprenorphine Hcl-Naloxone Hcl Shortness Of Breath and Other (See Comments)    "Felt like I was going to die," was  jittery, had trouble breathing, felt hot (reaction to Suboxone) PER THE NCCSRS database, the patient received #42 Suboxone films between 03/26/17 and 04/02/17 from [facility]...(??)  . Ciprofloxacin Anaphylaxis  . Morphine-Naltrexone Other (See Comments)    Seizures   . Shellfish-Derived Products Anaphylaxis, Hives and Swelling  . Benadryl [Diphenhydramine Hcl] Hives, Itching and Rash  . Fish Allergy Hives, Swelling and Rash  . Morphine And Related Other (See Comments)    Seizures  . Benadryl [Diphenhydramine]   . Ciprofloxacin   . Fish Allergy   . Morphine And Related   . Shellfish Allergy   . Suboxone [Buprenorphine Hcl-Naloxone Hcl]   . Vancomycin   . Vicodin [Hydrocodone-Acetaminophen]   . Vancomycin Rash  . Vicodin [Hydrocodone-Acetaminophen] Nausea Only     Review of Systems: Review of Systems  Constitutional: Negative for chills, fever and weight loss.  Respiratory: Negative  for cough, shortness of breath and wheezing.   Cardiovascular: Negative for chest pain and leg swelling.  Gastrointestinal: Negative for abdominal pain, constipation, diarrhea, nausea and vomiting.  Skin: Negative for rash.      OBJECTIVE: Vitals:   03/21/18 0259 03/21/18 0456 03/21/18 0840 03/21/18 1338  BP:  129/62  120/72  Pulse:  80  (!) 106  Resp:  16  18  Temp:  97.8 F (36.6 C)  97.6 F (36.4 C)  TempSrc:  Oral  Oral  SpO2:  100% 100% 98%  Weight: 61.4 kg     Height:       Body mass index is 17.86 kg/m.  Physical Exam Constitutional:      General: [name] is not in acute  distress.    Appearance: [name] is well-developed. [name] is ill-appearing.  Cardiovascular:     Rate and Rhythm: Normal rate and regular rhythm.     Heart sounds: Normal heart sounds.  Pulmonary:     Effort: Pulmonary effort is normal.     Breath sounds: Decreased breath sounds present. No wheezing, rhonchi or rales.  Musculoskeletal:     Comments: BKA on right.   Skin:    General: Skin is warm and dry.  Neurological:     Mental Status: [name] is alert.  Psychiatric:        Mood and Affect: Mood normal.     Lab Results Lab Results  Component Value Date   WBC 10.6 (H) 03/20/2018   HGB 8.7 (L) 03/20/2018   HCT 28.7 (L) 03/20/2018   MCV 74.5 (L) 03/20/2018   PLT 410 (H) 03/20/2018    Lab Results  Component Value Date   CREATININE 0.85 03/20/2018   BUN 12 03/20/2018   NA 135 03/20/2018   K 5.0 03/20/2018   CL 102 03/20/2018   CO2 26 03/20/2018    Lab Results  Component Value Date   ALT 23 03/17/2018   AST 23 03/17/2018   ALKPHOS 89 03/17/2018   BILITOT 0.3 03/17/2018     Microbiology: Recent Results (from the past 240 hour(s))  Blood culture (routine x 2)     Status: Abnormal   Collection Time: 03/16/18  1:20 PM  Result Value Ref Range Status   Specimen Description BLOOD LEFT HAND  Final   Special Requests   Final    BOTTLES DRAWN AEROBIC ONLY Blood Culture results may not be optimal due to an inadequate volume of blood received in culture bottles   Culture  Setup Time   Final    AEROBIC BOTTLE ONLY GRAM POSITIVE COCCI CRITICAL RESULT CALLED TO, READ BACK BY AND VERIFIED WITH: L SEAY PHARMD 03/18/18 0105 JDW    Culture (A)  Final    STAPHYLOCOCCUS SPECIES (COAGULASE NEGATIVE) THE SIGNIFICANCE OF ISOLATING THIS ORGANISM FROM A SINGLE SET OF BLOOD CULTURES WHEN MULTIPLE SETS ARE DRAWN IS UNCERTAIN. PLEASE NOTIFY THE MICROBIOLOGY DEPARTMENT WITHIN ONE WEEK IF SPECIATION AND SENSITIVITIES ARE REQUIRED. Performed at [facility], [place] [address] [place], [place]  [address]    Report Status 03/21/2018 FINAL  Final  Blood Culture ID Panel (Reflexed)     Status: Abnormal   Collection Time: 03/16/18  1:20 PM  Result Value Ref Range Status   Enterococcus species NOT DETECTED NOT DETECTED Final   Listeria monocytogenes NOT DETECTED NOT DETECTED Final   Staphylococcus species DETECTED (A) NOT DETECTED Final    Comment: Methicillin (oxacillin) resistant coagulase negative staphylococcus. Possible blood culture contaminant (unless isolated  from more than one blood culture draw or clinical case suggests pathogenicity). No antibiotic treatment is indicated for blood  culture contaminants. CRITICAL RESULT CALLED TO, READ BACK BY AND VERIFIED WITH: L SEAY PHARMD 03/18/18 0105 JDW    Staphylococcus aureus (BCID) NOT DETECTED NOT DETECTED Final   Methicillin resistance DETECTED (A) NOT DETECTED Final    Comment: CRITICAL RESULT CALLED TO, READ BACK BY AND VERIFIED WITH: L SEAY PHARMD 03/18/18 0105 JDW    Streptococcus species NOT DETECTED NOT DETECTED Final   Streptococcus agalactiae NOT DETECTED NOT DETECTED Final   Streptococcus pneumoniae NOT DETECTED NOT DETECTED Final   Streptococcus pyogenes NOT DETECTED NOT DETECTED Final   Acinetobacter baumannii NOT DETECTED NOT DETECTED Final   Enterobacteriaceae species NOT DETECTED NOT DETECTED Final   Enterobacter cloacae complex NOT DETECTED NOT DETECTED Final   Escherichia coli NOT DETECTED NOT DETECTED Final   Klebsiella oxytoca NOT DETECTED NOT DETECTED Final   Klebsiella pneumoniae NOT DETECTED NOT DETECTED Final   Proteus species NOT DETECTED NOT DETECTED Final   Serratia marcescens NOT DETECTED NOT DETECTED Final   Haemophilus influenzae NOT DETECTED NOT DETECTED Final   Neisseria meningitidis NOT DETECTED NOT DETECTED Final   Pseudomonas aeruginosa NOT DETECTED NOT DETECTED Final   Candida albicans NOT DETECTED NOT DETECTED Final   Candida glabrata NOT DETECTED NOT DETECTED Final   Candida krusei NOT  DETECTED NOT DETECTED Final   Candida parapsilosis NOT DETECTED NOT DETECTED Final   Candida tropicalis NOT DETECTED NOT DETECTED Final    Comment: Performed at [facility], [place]. [address], [place], [place] [address]  Urine Culture     Status: None   Collection Time: 03/16/18  4:29 PM  Result Value Ref Range Status   Specimen Description URINE, CLEAN CATCH  Final   Special Requests NONE  Final   Culture   Final    NO GROWTH Performed at [facility], [place] [address], [place], [place] [address]    Report Status 03/17/2018 FINAL  Final  MRSA PCR Screening     Status: None   Collection Time: 03/16/18  5:40 PM  Result Value Ref Range Status   MRSA by PCR NEGATIVE NEGATIVE Final    Comment:        The GeneXpert MRSA Assay (FDA approved for NASAL specimens only), is one component of a comprehensive MRSA colonization surveillance program. It is not intended to diagnose MRSA infection nor to guide or monitor treatment for MRSA infections. Performed at [facility], [place] [address], [place], [place] [address]   Blood culture (routine x 2)     Status: None   Collection Time: 03/16/18  6:58 PM  Result Value Ref Range Status   Specimen Description BLOOD LEFT ANTECUBITAL  Final   Special Requests   Final    BOTTLES DRAWN AEROBIC AND ANAEROBIC Blood Culture adequate volume   Culture   Final    NO GROWTH 5 DAYS Performed at [facility], [place] [address] [place], [place] [address]    Report Status 03/21/2018 FINAL  Final     Terri Piedra, NP [facility] [phone] Pager  03/21/2018  3:13 PM

## 2018-03-21 NOTE — Progress Notes (Signed)
Patient states he does not wish to use CPAP at this time. States he once wore one, but no longer needs one. No distress noted.

## 2018-03-21 NOTE — Care Management Note (Addendum)
Case Management Note  Patient Details  Name: [name] MRN: [id number] Date of Birth: [birth year]  Subjective/Objective:    Necrotizing pneumonia of left lung.   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  DME: owns W/C, rolling walker, shower chair.               Kule Gascoigne (Daughter)     [phone]       PCP: Antony Blackbird    03/22/2018 0900  Ref[facility], NCM made pt aware.   Action/Plan: Pt declined SNF placement, refusing to give up medicaid check. Pt states would like to go home. Pt receives PCS hrs daily, 3-4 hrs/ day, pt states. Agreeable to home health services. NCM  Offered choice. Referral made with [facility], awaiting response. Resides with brother. NCM spoke with brother via phone brother states pt will have 24/7 supervision once discharge.  Pt states son to provide transportation to home.  Expected Discharge Date:    03/21/2018            Expected Discharge Plan:  Home/Self Care  In-House Referral:  Clinical Social Work  Discharge planning Services  CM Consult  Post Acute Care Choice:  NA Choice offered to:  Patient  DME Arranged:  N/A DME Agency:  NA  HH Arranged:  PT HH Agency:     Status of Service:  In process, will continue to follow  If discussed at Long Length of Stay Meetings, dates discussed:    Additional Comments: [facility] didn't accept pt for home health services 2/2 illicit drug usage in the home.  [facility] declined to provide home health services. Referral made to [facility] ...awaiting response. Referral made to [facility]...awaiting response.   Whitman Hero [place], RN 03/21/2018, 12:28 PM

## 2018-03-21 NOTE — Progress Notes (Signed)
CSW received consult regarding PT recommendation of SNF at discharge.  CSW spoke with patient and explained SNF Medicaid process. Patient is refusing SNF and states that he would prefer to return home with his brother. He reports that an aide comes to the house for a few hours daily and that he has all equipment except an electric wheelchair which is in process. He provided permission to contact his brother to confirm plan [name] [phone]). RNCM confirmed with patient's brother that he is able to assist patient at discharge. Patient's son will pick him up tomorrow.   CSW signing off.   Percell Locus [name] Torbert LCSW [phone]

## 2018-03-21 NOTE — Progress Notes (Signed)
Physical Therapy Treatment Patient Details Name: [name] MRN: [id number] DOB: Jul 20, 1960 Today's Date: 03/21/2018    History of Present Illness Patient is a 58 y.o. 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    PT Comments    Patient seen to continue to progress mobility. No clarification of WB status, therefore remained NWB in session. Patient performing lateral scoot transfers to recliner today with supervision/min guard for safety - no physical assist required. Patient stating he is planning on returning home tomorrow with brother to assist. Expresses frustration that he has not yet received power w/c yet. Will continue to follow.    Follow Up Recommendations  SNF;Supervision/Assistance - 24 hour     Equipment Recommendations  Other (comment)(defer)    Recommendations for Other Services       Precautions / Restrictions Precautions Precautions: Fall Restrictions Weight Bearing Restrictions: Yes LLE Weight Bearing: Non weight bearing(await corroboration for WBAT)    Mobility  Bed Mobility Overal bed mobility: Needs Assistance Bed Mobility: Supine to Sit     Supine to sit: Min assist     General bed mobility comments: Min A to come up into sitting EOB for trunk control  Transfers Overall transfer level: Needs assistance   Transfers: Lateral/Scoot Transfers          Lateral/Scoot Transfers: Min guard;Supervision General transfer comment: able to scoot from EOB to recliner - no physical assist  Ambulation/Gait                 Stairs             Wheelchair Mobility    Modified Rankin (Stroke Patients Only)       Balance Overall balance assessment: Needs  assistance Sitting-balance support: No upper extremity supported;Feet unsupported Sitting balance-Leahy Scale: Fair                                      Cognition Arousal/Alertness: Awake/alert Behavior During Therapy: WFL for tasks assessed/performed Overall Cognitive Status: Within Functional Limits for tasks assessed                                        Exercises      General Comments        Pertinent Vitals/Pain Pain Assessment: No/denies pain    Home Living                      Prior Function            PT Goals (current goals can now be found in the care plan section) Acute Rehab PT Goals Patient Stated Goal: Feeling better, getting my prosthesis PT Goal Formulation: With patient Time For Goal Achievement: 03/31/18 Potential to Achieve Goals: Fair Progress towards PT goals: Progressing toward goals    Frequency    Min 3X/week      PT Plan Current plan remains appropriate    Co-evaluation              AM-PAC PT "6 Clicks" Mobility   Outcome Measure  Help needed turning from  your back to your side while in a flat bed without using bedrails?: A Little Help needed moving from lying on your back to sitting on the side of a flat bed without using bedrails?: A Little Help needed moving to and from a bed to a chair (including a wheelchair)?: A Little Help needed standing up from a chair using your arms (e.g., wheelchair or bedside chair)?: A Lot Help needed to walk in hospital room?: Total Help needed climbing 3-5 steps with a railing? : Total 6 Click Score: 13    End of Session   Activity Tolerance: Patient tolerated treatment well Patient left: in chair;with call bell/phone within reach;with chair alarm set Nurse Communication: Mobility status PT Visit Diagnosis: Other abnormalities of gait and mobility (R26.89);Muscle weakness (generalized) (M62.81);Pain     Time: 1337-1350 PT Time Calculation (min)  (ACUTE ONLY): 13 min  Charges:  $Therapeutic Activity: 8-22 mins                     Lanney Gins, PT, DPT Supplemental Physical Therapist 03/21/18 2:45 PM Pager: [phone] Office: [phone]

## 2018-03-21 NOTE — Progress Notes (Signed)
PROGRESS NOTE        PATIENT DETAILS Name: [name] Age: 58 y.o. Sex: male Date of Birth: 1960/08/05 Admit Date: 03/16/2018 Admitting Physician Eugenie Filler, MD [id number], Ander Gaster, MD  Brief Narrative: Patient is a 58 y.o. 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  See below for further details.  Subjective: Lying comfortably in bed-denies any chest pain or shortness of breath.  Feels better than the past few days.  Assessment/Plan: Necrotizing pneumonia of left lung: Slowly improving-afebrile-leukocytosis has resolved.  1/2 blood culture positive for MRSE likely contamination.  ID following-remains on IV Unasyn-we will plan to transition to Augmentin on discharge.   Oral thrush: Continue nystatin  Anemia: Appears to be microcytic-ferritin levels are normal-suspect has anemia of chronic disease-worsened by acute illness.  Hemoglobin stable at 8.7.  Follow periodically.    Dehydration: Volume status improved-now euvolemic.  Off all IV fluids.  DM-2: CBGs stable-continue SSI  COPD/asthma: Some scattered rhonchi but otherwise clear-no evidence of exacerbation-continue bronchodilators.  GERD: Continue PPI  CVA/PAD s/p right AKA: Continue aspirin/Plavix and statin  OSA: CPAP nightly  Severe protein calorie malnutrition: Continue supplements  Tobacco/cocaine use: Counseled-not sure if he has any indication of quitting at this point.  Chronic pain syndrome: Continue Lyrica and as needed tramadol  Debility/deconditioning: Secondary to necrotizing pneumonia of the left lung, dehydration-social work consult consulted for SNF discharge  DVT Prophylaxis: Prophylactic Lovenox   Code  Status: Full code   Family Communication: None at bedside  Disposition Plan: Remain inpatient-SNF versus home health services on discharge over the next few days  Antimicrobial agents: Anti-infectives (From admission, onward)   Start     Dose/Rate Route Frequency Ordered Stop   03/17/18 1700  fluconazole (DIFLUCAN) IVPB 100 mg  Status:  Discontinued     100 mg 50 mL/hr over 60 Minutes Intravenous Every 24 hours 03/16/18 1706 03/17/18 1121   03/17/18 0500  linezolid (ZYVOX) IVPB 600 mg  Status:  Discontinued     600 mg 300 mL/hr over 60 Minutes Intravenous Every 12 hours 03/16/18 1703 03/17/18 0923   03/16/18 2130  ceFEPIme (MAXIPIME) 1 g in sodium chloride 0.9 % 100 mL IVPB  Status:  Discontinued     1 g 200 mL/hr over 30 Minutes Intravenous Every 8 hours 03/16/18 1626 03/16/18 1705   03/16/18 1930  Ampicillin-Sulbactam (UNASYN) 3 g in sodium chloride 0.9 % 100 mL IVPB     3 g 200 mL/hr over 30 Minutes Intravenous Every 6 hours 03/16/18 1724     03/16/18 1715  fluconazole (DIFLUCAN) IVPB 200 mg  Status:  Discontinued     200 mg 100 mL/hr over 60 Minutes Intravenous  Once 03/16/18 1706 03/17/18 1121   03/16/18 1300  ceFEPIme (MAXIPIME) 2 g in sodium chloride 0.9 % 100 mL IVPB     2 g 200 mL/hr over 30 Minutes Intravenous  Once 03/16/18 1235 03/16/18 1408   03/16/18 1300  linezolid (ZYVOX) IVPB 600 mg     600 mg 300 mL/hr over 60 Minutes Intravenous  Once 03/16/18 1255 03/16/18 1821      Procedures: None  CONSULTS:  None  Time spent: 15- minutes-Greater than 50% of this time was spent in counseling, explanation of diagnosis, planning of further management, and coordination of care.  MEDICATIONS: Scheduled Meds: . aspirin  81 mg Oral Daily  . atorvastatin  80 mg Oral q1800  . budesonide (PULMICORT) nebulizer solution  0.5 mg Nebulization BID  . clopidogrel  75 mg Oral Daily  . enoxaparin (LOVENOX) injection  40 mg Subcutaneous Q24H  . famotidine  20 mg Oral Daily  .  feeding supplement (ENSURE ENLIVE)  237 mL Oral TID BM  . fluticasone  2 spray Each Nare Daily  . guaiFENesin  1,200 mg Oral BID  . insulin aspart  0-9 Units Subcutaneous TID WC  . ipratropium-albuterol  3 mL Nebulization BID  . multivitamin with minerals  1 tablet Oral Daily  . nicotine  21 mg Transdermal Daily  . nystatin  5 mL Oral QID  . pantoprazole  40 mg Oral Q0600  . polyethylene glycol  17 g Oral Daily  . pregabalin  75 mg Oral TID  . sodium chloride flush  3 mL Intravenous Q12H   Continuous Infusions: . ampicillin-sulbactam (UNASYN) IV 3 g (03/21/18 0641)   PRN Meds:.acetaminophen **OR** acetaminophen, hydrOXYzine, ipratropium-albuterol, ketorolac, magnesium citrate, oxyCODONE-acetaminophen, phenol, prochlorperazine, senna-docusate, sorbitol, traMADol   PHYSICAL EXAM: Vital signs: Vitals:   03/20/18 1926 03/21/18 0259 03/21/18 0456 03/21/18 0840  BP: 140/67  129/62   Pulse: (!) 101  80   Resp: 16  16   Temp: 98.1 F (36.7 C)  97.8 F (36.6 C)   TempSrc: Oral  Oral   SpO2: 97%  100% 100%  Weight:  61.4 kg    Height:       Filed Weights   03/19/18 0551 03/20/18 0329 03/21/18 0259  Weight: 61.6 kg 61 kg 61.4 kg   Body mass index is 17.86 kg/m.   General appearance:Awake, alert, not in any distress.  Eyes:no scleral icterus. HEENT: Atraumatic and Normocephalic Neck: supple, no JVD. Resp:Good air entry bilaterally, no added sounds heard anteriorly CVS: S1 S2 regular, no murmurs.  GI: Bowel sounds present, Non tender and not distended with no gaurding, rigidity or rebound. Extremities: Right AKA Neurology:  Non focal Psychiatric: Normal judgment and insight. Normal mood. Musculoskeletal:No digital cyanosis Skin:No Rash, warm and dry Wounds:N/A  I have personally reviewed following labs and imaging studies  LABORATORY DATA: CBC: Recent Labs  Lab 03/16/18 0918 03/17/18 0438 03/18/18 0323 03/20/18 0529  WBC 16.3* 13.6* 10.3 10.6*  NEUTROABS  --   10.6*  --   --   HGB 9.2* 7.8* 8.8* 8.7*  HCT 30.1* 26.3* 30.1* 28.7*  MCV 74.0* 73.9* 75.1* 74.5*  PLT 433* 337 379 410*    Basic Metabolic Panel: Recent Labs  Lab 03/16/18 0918 03/16/18 1857 03/17/18 0438 03/18/18 0323 03/20/18 0529  NA 135  --  135 137 135  K 3.7  --  3.7 4.0 5.0  CL 101  --  103 104 102  CO2 24  --  20* 23 26  GLUCOSE 165*  --  133* 166* 115*  BUN 9  --  8 11 12   CREATININE 0.78  --  0.75 0.88 0.85  CALCIUM 8.6*  --  7.9* 8.0* 8.4*  MG  --  1.8  --   --   --   PHOS  --   --  4.3  --   --     GFR: Estimated Creatinine Clearance: 81.3 mL/min (by C-G  formula based on SCr of 0.85 mg/dL).  Liver Function Tests: Recent Labs  Lab 03/16/18 0918 03/17/18 0438  AST 34 23  ALT 29 23  ALKPHOS 105 89  BILITOT 0.6 0.3  PROT 7.6 6.5  ALBUMIN 2.2* 1.8*   Recent Labs  Lab 03/16/18 0918  LIPASE 42   No results for input(s): AMMONIA in the last 168 hours.  Coagulation Profile: No results for input(s): INR, PROTIME in the last 168 hours.  Cardiac Enzymes: No results for input(s): CKTOTAL, CKMB, CKMBINDEX, TROPONINI in the last 168 hours.  BNP (last 3 results) No results for input(s): PROBNP in the last 8760 hours.  HbA1C: No results for input(s): HGBA1C in the last 72 hours.  CBG: Recent Labs  Lab 03/20/18 0752 03/20/18 1238 03/20/18 1715 03/20/18 1927 03/21/18 0745  GLUCAP 190* 162* 121* 138* 153*    Lipid Profile: No results for input(s): CHOL, HDL, LDLCALC, TRIG, CHOLHDL, LDLDIRECT in the last 72 hours.  Thyroid Function Tests: No results for input(s): TSH, T4TOTAL, FREET4, T3FREE, THYROIDAB in the last 72 hours.  Anemia Panel: No results for input(s): VITAMINB12, FOLATE, FERRITIN, TIBC, IRON, RETICCTPCT in the last 72 hours.  Urine analysis:    Component Value Date/Time   COLORURINE YELLOW 03/16/2018 1904   APPEARANCEUR CLEAR 03/16/2018 1904   LABSPEC 1.042 (H) 03/16/2018 1904   PHURINE 7.0 03/16/2018 1904   GLUCOSEU  NEGATIVE 03/16/2018 1904   GLUCOSEU > 1000 mg/dL (A) 04/06/2008 2035   HGBUR NEGATIVE 03/16/2018 1904   BILIRUBINUR NEGATIVE 03/16/2018 1904   KETONESUR NEGATIVE 03/16/2018 1904   PROTEINUR NEGATIVE 03/16/2018 1904   UROBILINOGEN 1.0 11/21/2014 1600   NITRITE NEGATIVE 03/16/2018 1904   LEUKOCYTESUR NEGATIVE 03/16/2018 1904    Sepsis Labs: Lactic Acid, Venous    Component Value Date/Time   LATICACIDVEN 1.83 01/21/2018 1854    MICROBIOLOGY: Recent Results (from the past 240 hour(s))  Blood culture (routine x 2)     Status: Abnormal   Collection Time: 03/16/18  1:20 PM  Result Value Ref Range Status   Specimen Description BLOOD LEFT HAND  Final   Special Requests   Final    BOTTLES DRAWN AEROBIC ONLY Blood Culture results may not be optimal due to an inadequate volume of blood received in culture bottles   Culture  Setup Time   Final    AEROBIC BOTTLE ONLY GRAM POSITIVE COCCI CRITICAL RESULT CALLED TO, READ BACK BY AND VERIFIED WITH: L SEAY PHARMD 03/18/18 0105 JDW    Culture (A)  Final    STAPHYLOCOCCUS SPECIES (COAGULASE NEGATIVE) THE SIGNIFICANCE OF ISOLATING THIS ORGANISM FROM A SINGLE SET OF BLOOD CULTURES WHEN MULTIPLE SETS ARE DRAWN IS UNCERTAIN. PLEASE NOTIFY THE MICROBIOLOGY DEPARTMENT WITHIN ONE WEEK IF SPECIATION AND SENSITIVITIES ARE REQUIRED. Performed at [facility], [place] [address] [place], [place] [address]    Report Status 03/21/2018 FINAL  Final  Blood Culture ID Panel (Reflexed)     Status: Abnormal   Collection Time: 03/16/18  1:20 PM  Result Value Ref Range Status   Enterococcus species NOT DETECTED NOT DETECTED Final   Listeria monocytogenes NOT DETECTED NOT DETECTED Final   Staphylococcus species DETECTED (A) NOT DETECTED Final    Comment: Methicillin (oxacillin) resistant coagulase negative staphylococcus. Possible blood culture contaminant (unless isolated from more than one blood culture draw or clinical case suggests pathogenicity). No  antibiotic treatment is indicated for blood  culture contaminants. CRITICAL RESULT CALLED TO, READ BACK BY AND VERIFIED WITH: L SEAY  PHARMD 03/18/18 0105 JDW    Staphylococcus aureus (BCID) NOT DETECTED NOT DETECTED Final   Methicillin resistance DETECTED (A) NOT DETECTED Final    Comment: CRITICAL RESULT CALLED TO, READ BACK BY AND VERIFIED WITH: L SEAY PHARMD 03/18/18 0105 JDW    Streptococcus species NOT DETECTED NOT DETECTED Final   Streptococcus agalactiae NOT DETECTED NOT DETECTED Final   Streptococcus pneumoniae NOT DETECTED NOT DETECTED Final   Streptococcus pyogenes NOT DETECTED NOT DETECTED Final   Acinetobacter baumannii NOT DETECTED NOT DETECTED Final   Enterobacteriaceae species NOT DETECTED NOT DETECTED Final   Enterobacter cloacae complex NOT DETECTED NOT DETECTED Final   Escherichia coli NOT DETECTED NOT DETECTED Final   Klebsiella oxytoca NOT DETECTED NOT DETECTED Final   Klebsiella pneumoniae NOT DETECTED NOT DETECTED Final   Proteus species NOT DETECTED NOT DETECTED Final   Serratia marcescens NOT DETECTED NOT DETECTED Final   Haemophilus influenzae NOT DETECTED NOT DETECTED Final   Neisseria meningitidis NOT DETECTED NOT DETECTED Final   Pseudomonas aeruginosa NOT DETECTED NOT DETECTED Final   Candida albicans NOT DETECTED NOT DETECTED Final   Candida glabrata NOT DETECTED NOT DETECTED Final   Candida krusei NOT DETECTED NOT DETECTED Final   Candida parapsilosis NOT DETECTED NOT DETECTED Final   Candida tropicalis NOT DETECTED NOT DETECTED Final    Comment: Performed at [facility], [place] [address] [place], [place] [address]  Urine Culture     Status: None   Collection Time: 03/16/18  4:29 PM  Result Value Ref Range Status   Specimen Description URINE, CLEAN CATCH  Final   Special Requests NONE  Final   Culture   Final    NO GROWTH Performed at [facility], [place] [address], [place], [place] [address]    Report Status 03/17/2018 FINAL  Final  MRSA  PCR Screening     Status: None   Collection Time: 03/16/18  5:40 PM  Result Value Ref Range Status   MRSA by PCR NEGATIVE NEGATIVE Final    Comment:        The GeneXpert MRSA Assay (FDA approved for NASAL specimens only), is one component of a comprehensive MRSA colonization surveillance program. It is not intended to diagnose MRSA infection nor to guide or monitor treatment for MRSA infections. Performed at [facility], [place] [address] [place], [place] [address]   Blood culture (routine x 2)     Status: None   Collection Time: 03/16/18  6:58 PM  Result Value Ref Range Status   Specimen Description BLOOD LEFT ANTECUBITAL  Final   Special Requests   Final    BOTTLES DRAWN AEROBIC AND ANAEROBIC Blood Culture adequate volume   Culture   Final    NO GROWTH 5 DAYS Performed at [facility], [place] [address], [place], [place] [address]    Report Status 03/21/2018 FINAL  Final    RADIOLOGY STUDIES/RESULTS: Dg Chest 2 View  Result Date: 02/26/2018 CLINICAL DATA:  Cough productive of green mucus, shortness of breath, and central chest pain since yesterday, LEFT femur surgery 3 weeks ago, history hypertension, diabetes mellitus, COPD, CHF, asthma, pulmonary embolism, TB exposure EXAM: CHEST - 2 VIEW COMPARISON:  None FINDINGS: Normal heart size, mediastinal contours, and pulmonary vascularity. Atherosclerotic calcification aorta. Emphysematous changes with focal LEFT lower lobe consolidation consistent with pneumonia. Minimal subsegmental atelectasis at RIGHT base. Remaining lungs clear. No pleural effusion or pneumothorax. Multiple old fractures of LEFT ribs and LEFT clavicle.  Osseous demineralization. IMPRESSION: COPD changes with LEFT lower lobe pneumonia and mild RIGHT basilar atelectasis. Electronically Signed   By: Lavonia Dana M.D.   On: 02/26/2018 17:15   Ct Chest W Contrast  Result Date: 03/16/2018 CLINICAL DATA:  Mass-like consolidation in the left lower lobe on recent  chest radiographs and abdomen and pelvis CT. The patient has had worsening nausea and vomiting and at the time of the chest radiographs had a productive cough, shortness of breath and central chest pain. History of COPD, asthma, pulmonary embolism and TB exposure. The patient is a current smoker, smoking 2 packs of cigarettes per day with a 104 pack-year history of smoking. EXAM: CT CHEST WITH CONTRAST TECHNIQUE: Multidetector CT imaging of the chest was performed during intravenous contrast administration. CONTRAST:  78mL OMNIPAQUE IOHEXOL 300 MG/ML  SOLN COMPARISON:  Chest radiographs dated 02/26/2018 and abdomen and pelvis CT dated 03/16/2018. FINDINGS: Cardiovascular: Atheromatous calcifications, including the coronary arteries and aorta. Normal sized heart. Mediastinum/Nodes: Multiple enlarged mediastinal and bilateral hilar lymph nodes, greatest in the left hilar region. These include a 12 mm short axis AP window node on image number 62 series 4, 10 mm short axis right anterior paratracheal node on image number 43 series 4, 14 mm short axis subcarinal node on image number 68 series 4, 12 mm short axis right hilar node on image number 70 series 4 and a 17 mm short axis left hilar node on image number 86 series 4. Unremarkable thyroid gland and esophagus. Lungs/Pleura: Patchy and dense, mass-like airspace consolidation in the posterior aspect of the left lower lobe with a prominent oval central low density component measuring 6.1 x 4.2 cm on image number 99 series 4. There is also mucous plugging in multiple left lower lobe bronchi. This is relatively low density with small amount of interspersed air. Mild diffuse peribronchial thickening and mild centrilobular bullous changes. Small amount of bilateral linear atelectasis or scarring. No pleural fluid. Upper Abdomen: Atheromatous arterial calcifications without aneurysm. Mild diffuse low density of the liver relative to the spleen. Musculoskeletal: Old, healed  bilateral rib fractures. Thoracic and lower cervical spine degenerative changes. IMPRESSION: 1. 6.1 x 4.2 cm mass-like area of consolidation in the posterior aspect of the left lower lobe with a central low density component. This has an appearance most compatible with necrotizing pneumonia and possible lung abscess. An underlying cavitary neoplasm is less likely but not excluded. 2. Mucous plugging involving multiple left lower lobe bronchi. 3. Mild mediastinal and bilateral hilar adenopathy, most likely reactive. Metastatic adenopathy is also a possibility. 4.  Calcific coronary artery and aortic atherosclerosis. 5. Mild changes of COPD and chronic bronchitis. 6. Mild diffuse hepatic steatosis. Aortic Atherosclerosis (ICD10-I70.0) and Emphysema (ICD10-J43.9). Electronically Signed   By: Claudie Revering M.D.   On: 03/16/2018 14:54   Ct Abdomen Pelvis W Contrast  Result Date: 03/16/2018 CLINICAL DATA:  Nausea, vomiting, worsening EXAM: CT ABDOMEN AND PELVIS WITH CONTRAST TECHNIQUE: Multidetector CT imaging of the abdomen and pelvis was performed using the standard protocol following bolus administration of intravenous contrast. CONTRAST:  160mL OMNIPAQUE IOHEXOL 300 MG/ML  SOLN COMPARISON:  Chest radiograph 02/26/2018 FINDINGS: Lower chest: There is a partially included very dense, masslike consolidation of the left lower lobe, in keeping with findings of prior radiograph (series 3, image 1). Hepatobiliary: No focal liver abnormality is seen. No gallstones, gallbladder wall thickening, or biliary dilatation. Pancreas: Unremarkable. No pancreatic ductal dilatation or surrounding inflammatory changes. Spleen: Splenomegaly, maximum span approximately 15.9 cm.  Adrenals/Urinary Tract: Adrenal glands are unremarkable. Kidneys are normal, without renal calculi, focal lesion, or hydronephrosis. Bladder is unremarkable. Stomach/Bowel: Stomach is within normal limits. Appendix not clearly visualized. No evidence of bowel wall  thickening, distention, or inflammatory changes. Large burden of stool in the colon. Vascular/Lymphatic: Severe mixed calcific atherosclerosis of the abdominal aorta and branch vessels, with a left common iliac artery stent, which appears patent. 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 No enlarged abdominal or pelvic lymph nodes. Reproductive: No mass or other abnormality. Other: No abdominal wall hernia or abnormality. No abdominopelvic ascites. Musculoskeletal: No acute or significant osseous findings. IMPRESSION: 1. There is a partially included very dense, masslike consolidation of the left lower lobe, in keeping with findings of prior radiograph (series 3, image 1). Findings are concerning for infection or aspiration, particularly necrotizing infection given the appearance. Underlying mass is not excluded and at minimum, follow-up CT or radiographs are warranted at 6-8 weeks to document complete resolution. 2.  Splenomegaly. 3. 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 Recommend vascular evaluation. Electronically Signed   By: Eddie Candle M.D.   On: 03/16/2018 11:56   Dg Abd Acute 2+v W 1v Chest  Result Date: 03/02/2018 CLINICAL DATA:  Cough nausea and vomiting EXAM: DG ABDOMEN ACUTE W/ 1V CHEST COMPARISON:  CT 01/21/2018, chest x-ray 01/21/2018 FINDINGS: Single-view chest demonstrates left perihilar airspace disease. Normal heart size. Old left upper rib deformities. Aortic atherosclerosis. Supine and decubitus views of the abdomen demonstrate no free air. Nonobstructed bowel-gas pattern with moderate stool. Left iliac stent. Clips in the right groin. Surgical hardware in the left femur. Faint linear calcifications  in the region of the renal hilus, likely reflecting intrarenal vascular calcification. IMPRESSION: 1. Left perihilar airspace disease concerning for a pneumonia 2. Nonobstructed bowel-gas pattern Electronically Signed   By: Donavan Foil M.D.   On: 03/02/2018 23:50     LOS: 5 days   Oren Binet, MD  [facility]  If 7PM-7AM, please contact night-coverage  Please page via [email or web address]  Go to [email or web address] and use [facility]'s universal password to access. If you do not have the password, please contact the hospital operator.  Locate the [facility] provider you are looking for under [facility] and page to a number that you can be directly reached. If you still have difficulty reaching the provider, please page the [facility] (Director on Call) for the Hospitalists listed on amion for assistance.  03/21/2018, 10:03 AM

## 2018-03-22 ENCOUNTER — Ambulatory Visit (HOSPITAL_COMMUNITY): Payer: Medicaid Other

## 2018-03-22 ENCOUNTER — Ambulatory Visit (HOSPITAL_COMMUNITY): Admission: RE | Admit: 2018-03-22 | Payer: Medicaid Other | Source: Ambulatory Visit

## 2018-03-22 ENCOUNTER — Ambulatory Visit: Payer: Self-pay | Admitting: Vascular Surgery

## 2018-03-22 LAB — GLUCOSE, CAPILLARY
Glucose-Capillary: 177 mg/dL — ABNORMAL HIGH (ref 70–99)
Glucose-Capillary: 191 mg/dL — ABNORMAL HIGH (ref 70–99)

## 2018-03-22 MED ORDER — ENSURE ENLIVE PO LIQD: 237.0000 mL | Freq: Three times a day (TID) | ORAL | 0 refills | Status: DC

## 2018-03-22 MED ORDER — GUAIFENESIN ER 600 MG PO TB12: 1200.0000 mg | ORAL_TABLET | Freq: Two times a day (BID) | ORAL | 0 refills | Status: DC

## 2018-03-22 NOTE — Progress Notes (Signed)
Patient was discharged home by MD order; discharged instructions review and give to patient with care notes and prescriptions; IV DIC; patient will be escorted to the car by a volunteer via wheelchair.

## 2018-03-22 NOTE — Discharge Summary (Signed)
PATIENT DETAILS Name: [name] Age: 58 y.o. Sex: male Date of Birth: Jan 20, 1960 MRN: [id number]. Admitting Physician: Eugenie Filler, MD YHC:WCBJ, Ander Gaster, MD  Admit Date: 03/16/2018 Discharge date: 03/22/2018  Recommendations for Outpatient Follow-up:  1. Follow up with PCP in 1-2 weeks 2. Please obtain BMP/CBC in one week 3. Please ensure follow up witht the ID Clinic 4. Repeat CT Chest in 4 weeks to ensure resolution of lung abscess-if still persistent may need work up for underlying neoplasm 5. Needs ongoing counseling regarding stopping cocaine and ETOH use  Admitted From:  Home  Disposition: Home with home health [place]: Yes  Equipment/Devices: None  Discharge Condition: Stable  CODE STATUS: FULL CODE  Diet recommendation:  Heart Healthy  Brief Summary: See H&P, Labs, Consult and Test reports for all details in brief,Patient is a 58 y.o. 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   Brief Hospital Course: Necrotizing pneumonia of left lung: Slowly improving-afebrile-leukocytosis has resolved.  1/2 blood culture positive for MRSE likely contamination.  ID consulted, remained on  on IV Unasyn-we will plan to transition to Augmentin on discharge. Follow up with ID Clinic arranged. Repeat CT Chest in 4 weeks to ensure resolution of lung abscess-if still persistent may need work up for underlying neoplasm  Oral thrush: Finished a weeks course of nystatin-much better  Anemia: Appears to be microcytic-ferritin levels are normal-suspect has anemia of chronic disease-worsened by acute illness.  Hemoglobin stable at 8.7.  Follow periodically.    Dehydration: Volume status improved with  IVF-now euvolemic.   DM-2: CBGs stable with SSI-diet controlled in the outpatient setting  COPD/asthma: stable-continue usual bronchodilators.  GERD: Continue PPI  CVA/PAD s/p right AKA: Continue aspirin/Plavix and statin  OSA: CPAP nightly-but non compliant  Severe protein calorie malnutrition: Continue supplements  Tobacco/cocaine use: Counseled-not sure if he has any indication of quitting at this point.  Chronic pain syndrome: Continue Lyrica and as needed narcotics-since has chronic pain-needs to follow with his outpatient MD's for narcotic refills.  Debility/deconditioning: Secondary to necrotizing pneumonia of the left lung, dehydration-social work consult consulted for SNF discharge-but patient refused-wants to go home with Home health services  Procedures/Studies: None  Discharge Diagnoses:  Principal Problem:   Necrotizing pneumonia ([place]) Active Problems:   Type 2 diabetes mellitus ([place])   Dyslipidemia   Anxiety state   SLEEP APNEA, OBSTRUCTIVE, MODERATE   HYPERTENSION, BENIGN ESSENTIAL   Peripheral vascular disease ([place])   COPD (chronic obstructive pulmonary disease) ([facility])   Gastroesophageal reflux disease   Chronic pain in left foot   Tobacco abuse   Protein-calorie malnutrition, severe ([facility])   History of pulmonary embolism   Polysubstance abuse ([place])   Microcytic anemia   Diabetes mellitus type 2 in nonobese ([place])   Thrush, oral   Acute dehydration   Malnutrition of moderate degree   Discharge Instructions:  Activity:  As tolerated   Discharge Instructions    Call MD for:  difficulty breathing, headache or visual disturbances   Complete by:  As directed    Call MD for:  temperature >100.4   Complete by:  As directed    Diet - low sodium heart healthy   Complete by:  As directed    Discharge  instructions   Complete by:  As directed    Follow with Primary MD  Fulp, Cammie, MD IN 1 WEEK  Follow with the ID clinic as instructed  STOP  COCAINE USE  STOP TOBACCO USE  Please get a complete blood count and chemistry panel checked by your Primary MD at your next visit, and again as instructed by your Primary MD.  Get Medicines reviewed and adjusted: Please take all your medications with you for your next visit with your Primary MD  Laboratory/radiological data: Please request your Primary MD to go over all hospital tests and procedure/radiological results at the follow up, please ask your Primary MD to get all Hospital records sent to his/her office.  In some cases, they will be blood work, cultures and biopsy results pending at the time of your discharge. Please request that your primary care M.D. follows up on these results.  Also Note the following: If you experience worsening of your admission symptoms, develop shortness of breath, life threatening emergency, suicidal or homicidal thoughts you must seek medical attention immediately by calling 911 or calling your MD immediately  if symptoms less severe.  You must read complete instructions/literature along with all the possible adverse reactions/side effects for all the Medicines you take and that have been prescribed to you. Take any new Medicines after you have completely understood and accpet all the possible adverse reactions/side effects.   Do not drive when taking Pain medications or sleeping medications (Benzodaizepines)  Do not take more than prescribed Pain, Sleep and Anxiety Medications. It is not advisable to combine anxiety,sleep and pain medications without talking with your primary care practitioner  Special Instructions: If you have smoked or chewed Tobacco  in the last 2 yrs please stop smoking, stop any regular Alcohol  and or any Recreational drug use.  Wear Seat belts while driving.  Please note: You were cared for by a hospitalist during your hospital stay. Once you are discharged, your primary care physician will handle any further medical issues.  Please note that NO REFILLS for any discharge medications will be authorized once you are discharged, as it is imperative that you return to your primary care physician (or establish a relationship with a primary care physician if you do not have one) for your post hospital discharge needs so that they can reassess your need for medications and monitor your lab values.   Increase activity slowly   Complete by:  As directed      Allergies as of 03/22/2018      Reactions   Buprenorphine Hcl-naloxone Hcl Shortness Of Breath, Other (See Comments)   "Felt like I was going to die," was  jittery, had trouble breathing, felt hot (reaction to Suboxone) PER THE NCCSRS database, the patient received #42 Suboxone films between 03/26/17 and 04/02/17 from [facility](??)   Ciprofloxacin Anaphylaxis   Morphine-naltrexone Other (See Comments)   Seizures   Shellfish-derived Products Anaphylaxis, Hives, Swelling   Benadryl [diphenhydramine Hcl] Hives, Itching, Rash   Fish Allergy Hives, Swelling, Rash   Morphine And Related Other (See Comments)   Seizures   Benadryl [diphenhydramine]    Ciprofloxacin    Fish Allergy    Morphine And Related    Shellfish Allergy    Suboxone [buprenorphine Hcl-naloxone Hcl]    Vancomycin    Vicodin [hydrocodone-acetaminophen]    Vancomycin Rash   Vicodin [hydrocodone-acetaminophen] Nausea Only      Medication List    STOP taking these medications   doxycycline  100 MG capsule Commonly known as:  VIBRAMYCIN     TAKE these medications   Accu-Chek Aviva device Use as instructed to check blood sugar once per day   accu-chek soft touch lancets Use as instructed once per day when checking blood sugar   albuterol 108 (90 Base) MCG/ACT inhaler Commonly known as:  PROVENTIL HFA;VENTOLIN HFA Inhale 2 puffs into the lungs every 4 (four) hours as needed for wheezing or shortness of breath.   albuterol (2.5 MG/3ML) 0.083% nebulizer solution Commonly  known as:  PROVENTIL Take 3 mLs (2.5 mg total) by nebulization every 6 (six) hours as needed for wheezing or shortness of breath.   albuterol 108 (90 Base) MCG/ACT inhaler Commonly known as:  PROVENTIL HFA;VENTOLIN HFA Inhale 1-2 puffs into the lungs every 6 (six) hours as needed for wheezing or shortness of breath.   amoxicillin-clavulanate 875-125 MG tablet Commonly known as:  Augmentin Take 1 tablet by mouth 2 (two) times daily for 28 days.   aspirin 325 MG tablet Commonly known as:  Bayer Aspirin Take 1 tablet (325 mg total) by mouth daily. What changed:  how much to take   atorvastatin 80 MG tablet Commonly known as:  LIPITOR Take 1 tablet (80 mg total) by mouth daily at 6 PM.   clopidogrel 75 MG tablet Commonly known as:  PLAVIX Take 1 tablet (75 mg total) by mouth daily.   feeding supplement (ENSURE ENLIVE) Liqd Take 237 mLs by mouth 3 (three) times daily between meals.   fluticasone 50 MCG/ACT nasal spray Commonly known as:  FLONASE Place 2 sprays into both nostrils daily.   glucose blood test strip Commonly known as:  Accu-Chek Aviva Use as instructed once daily to check blood sugar   guaiFENesin 600 MG 12 hr tablet Commonly known as:  MUCINEX Take 2 tablets (1,200 mg total) by mouth 2 (two) times daily.   hydrOXYzine 25 MG capsule Commonly known as:  VISTARIL Take 1 capsule (25 mg total) by mouth 2 (two) times daily. Itching   nicotine 21 mg/24hr patch Commonly known as:  NICODERM CQ - dosed in mg/24 hours Place 1 patch (21 mg total) onto the skin daily.   ondansetron 4 MG disintegrating tablet Commonly known as:  Zofran ODT Take 1 tablet (4 mg total) by mouth every 8 (eight) hours as needed for nausea.   oxyCODONE-acetaminophen 5-325 MG tablet Commonly known as:  Percocet Take 1-2 tablets by mouth every 6 (six) hours as needed for severe pain. What changed:  Another medication with the same name was removed. Continue taking this medication, and follow  the directions you see here.   pantoprazole 20 MG tablet Commonly known as:  PROTONIX Take 1 tablet (20 mg total) by mouth daily before breakfast.   polyethylene glycol packet Commonly known as:  MIRALAX / GLYCOLAX Take 17 g by mouth daily.   pregabalin 75 MG capsule Commonly known as:  Lyrica TAKE 1 CAPSULE BY MOUTH THREE TIMES A DAY What changed:    how much to take  how to take this  when to take this  additional instructions   ranitidine 150 MG tablet Commonly known as:  ZANTAC Take 2 tablets (300 mg total) by mouth daily.   senna-docusate 8.6-50 MG tablet Commonly known as:  Senokot-S Take 2 tablets by mouth daily as needed for mild constipation.   traMADol 50 MG tablet Commonly known as:  ULTRAM Take 1 tablet (50 mg total) by mouth 3 (three) times daily as needed  for moderate pain.      Follow-up Information    Fulp, Cammie, MD. Schedule an appointment as soon as possible for a visit today.   Specialty:  Family Medicine Contact information: [place] [address] [phone]        [address], FNP Follow up.   Specialties:  Family Medicine, Infectious Diseases Why:  04/05/18 at 9:30am. If you are unable to make this appointment please call our office to reschedule.  Contact information: [address] [place] [address] [phone]          Allergies  Allergen Reactions  . Buprenorphine Hcl-Naloxone Hcl Shortness Of Breath and Other (See Comments)    "Felt like I was going to die," was  jittery, had trouble breathing, felt hot (reaction to Suboxone) PER THE NCCSRS database, the patient received #42 Suboxone films between 03/26/17 and 04/02/17 from [facility](??)  . Ciprofloxacin Anaphylaxis  . Morphine-Naltrexone Other (See Comments)    Seizures   . Shellfish-Derived Products Anaphylaxis, Hives and Swelling  . Benadryl [Diphenhydramine Hcl] Hives, Itching and Rash  . Fish Allergy  Hives, Swelling and Rash  . Morphine And Related Other (See Comments)    Seizures  . Benadryl [Diphenhydramine]   . Ciprofloxacin   . Fish Allergy   . Morphine And Related   . Shellfish Allergy   . Suboxone [Buprenorphine Hcl-Naloxone Hcl]   . Vancomycin   . Vicodin [Hydrocodone-Acetaminophen]   . Vancomycin Rash  . Vicodin [Hydrocodone-Acetaminophen] Nausea Only    Consultations:   ID   Other Procedures/Studies: Dg Chest 2 View  Result Date: 02/26/2018 CLINICAL DATA:  Cough productive of green mucus, shortness of breath, and central chest pain since yesterday, LEFT femur surgery 3 weeks ago, history hypertension, diabetes mellitus, COPD, CHF, asthma, pulmonary embolism, TB exposure EXAM: CHEST - 2 VIEW COMPARISON:  None FINDINGS: Normal heart size, mediastinal contours, and pulmonary vascularity. Atherosclerotic calcification aorta. Emphysematous changes with focal LEFT lower lobe consolidation consistent with pneumonia. Minimal subsegmental atelectasis at RIGHT base. Remaining lungs clear. No pleural effusion or pneumothorax. Multiple old fractures of LEFT ribs and LEFT clavicle. Osseous demineralization. IMPRESSION: COPD changes with LEFT lower lobe pneumonia and mild RIGHT basilar atelectasis. Electronically Signed   By: Lavonia Dana M.D.   On: 02/26/2018 17:15   Ct Chest W Contrast  Result Date: 03/16/2018 CLINICAL DATA:  Mass-like consolidation in the left lower lobe on recent chest radiographs and abdomen and pelvis CT. The patient has had worsening nausea and vomiting and at the time of the chest radiographs had a productive cough, shortness of breath and central chest pain. History of COPD, asthma, pulmonary embolism and TB exposure. The patient is a current smoker, smoking 2 packs of cigarettes per day with a 104 pack-year history of smoking. EXAM: CT CHEST WITH CONTRAST TECHNIQUE: Multidetector CT imaging of the chest was performed during intravenous contrast administration.  CONTRAST:  44mL OMNIPAQUE IOHEXOL 300 MG/ML  SOLN COMPARISON:  Chest radiographs dated 02/26/2018 and abdomen and pelvis CT dated 03/16/2018. FINDINGS: Cardiovascular: Atheromatous calcifications, including the coronary arteries and aorta. Normal sized heart. Mediastinum/Nodes: Multiple enlarged mediastinal and bilateral hilar lymph nodes, greatest in the left hilar region. These include a 12 mm short axis AP window node on image number 62 series 4, 10 mm short axis right anterior paratracheal node on image number 43 series 4, 14 mm short axis subcarinal node on image number 68 series 4, 12 mm short axis right  hilar node on image number 70 series 4 and a 17 mm short axis left hilar node on image number 86 series 4. Unremarkable thyroid gland and esophagus. Lungs/Pleura: Patchy and dense, mass-like airspace consolidation in the posterior aspect of the left lower lobe with a prominent oval central low density component measuring 6.1 x 4.2 cm on image number 99 series 4. There is also mucous plugging in multiple left lower lobe bronchi. This is relatively low density with small amount of interspersed air. Mild diffuse peribronchial thickening and mild centrilobular bullous changes. Small amount of bilateral linear atelectasis or scarring. No pleural fluid. Upper Abdomen: Atheromatous arterial calcifications without aneurysm. Mild diffuse low density of the liver relative to the spleen. Musculoskeletal: Old, healed bilateral rib fractures. Thoracic and lower cervical spine degenerative changes. IMPRESSION: 1. 6.1 x 4.2 cm mass-like area of consolidation in the posterior aspect of the left lower lobe with a central low density component. This has an appearance most compatible with necrotizing pneumonia and possible lung abscess. An underlying cavitary neoplasm is less likely but not excluded. 2. Mucous plugging involving multiple left lower lobe bronchi. 3. Mild mediastinal and bilateral hilar adenopathy, most likely  reactive. Metastatic adenopathy is also a possibility. 4.  Calcific coronary artery and aortic atherosclerosis. 5. Mild changes of COPD and chronic bronchitis. 6. Mild diffuse hepatic steatosis. Aortic Atherosclerosis (ICD10-I70.0) and Emphysema (ICD10-J43.9). Electronically Signed   By: Claudie Revering M.D.   On: 03/16/2018 14:54   Ct Abdomen Pelvis W Contrast  Result Date: 03/16/2018 CLINICAL DATA:  Nausea, vomiting, worsening EXAM: CT ABDOMEN AND PELVIS WITH CONTRAST TECHNIQUE: Multidetector CT imaging of the abdomen and pelvis was performed using the standard protocol following bolus administration of intravenous contrast. CONTRAST:  161mL OMNIPAQUE IOHEXOL 300 MG/ML  SOLN COMPARISON:  Chest radiograph 02/26/2018 FINDINGS: Lower chest: There is a partially included very dense, masslike consolidation of the left lower lobe, in keeping with findings of prior radiograph (series 3, image 1). Hepatobiliary: No focal liver abnormality is seen. No gallstones, gallbladder wall thickening, or biliary dilatation. Pancreas: Unremarkable. No pancreatic ductal dilatation or surrounding inflammatory changes. Spleen: Splenomegaly, maximum span approximately 15.9 cm. Adrenals/Urinary Tract: Adrenal glands are unremarkable. Kidneys are normal, without renal calculi, focal lesion, or hydronephrosis. Bladder is unremarkable. Stomach/Bowel: Stomach is within normal limits. Appendix not clearly visualized. No evidence of bowel wall thickening, distention, or inflammatory changes. Large burden of stool in the colon. Vascular/Lymphatic: Severe mixed calcific atherosclerosis of the abdominal aorta and branch vessels, with a left common iliac artery stent, which appears patent. 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 No enlarged abdominal or pelvic lymph nodes. Reproductive: No mass or other  abnormality. Other: No abdominal wall hernia or abnormality. No abdominopelvic ascites. Musculoskeletal: No acute or significant osseous findings. IMPRESSION: 1. There is a partially included very dense, masslike consolidation of the left lower lobe, in keeping with findings of prior radiograph (series 3, image 1). Findings are concerning for infection or aspiration, particularly necrotizing infection given the appearance. Underlying mass is not excluded and at minimum, follow-up CT or radiographs are warranted at 6-8 weeks to document complete resolution. 2.  Splenomegaly. 3. 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 Recommend vascular evaluation.  Electronically Signed   By: Eddie Candle M.D.   On: 03/16/2018 11:56   Dg Abd Acute 2+v W 1v Chest  Result Date: 03/02/2018 CLINICAL DATA:  Cough nausea and vomiting EXAM: DG ABDOMEN ACUTE W/ 1V CHEST COMPARISON:  CT 01/21/2018, chest x-ray 01/21/2018 FINDINGS: Single-view chest demonstrates left perihilar airspace disease. Normal heart size. Old left upper rib deformities. Aortic atherosclerosis. Supine and decubitus views of the abdomen demonstrate no free air. Nonobstructed bowel-gas pattern with moderate stool. Left iliac stent. Clips in the right groin. Surgical hardware in the left femur. Faint linear calcifications in the region of the renal hilus, likely reflecting intrarenal vascular calcification. IMPRESSION: 1. Left perihilar airspace disease concerning for a pneumonia 2. Nonobstructed bowel-gas pattern Electronically Signed   By: Donavan Foil M.D.   On: 03/02/2018 23:50      TODAY-DAY OF DISCHARGE:  Subjective:   Maurene Capes today has no headache,no chest abdominal pain,no new weakness tingling or numbness, feels much better wants to go home today.   Objective:   Blood pressure 140/77, pulse  84, temperature 98 F (36.7 C), temperature source Oral, resp. rate 18, height 6\' 1"  (1.854 m), weight 61.4 kg, SpO2 97 %.  Intake/Output Summary (Last 24 hours) at 03/22/2018 0803 Last data filed at 03/22/2018 4765 Gross per 24 hour  Intake 700 ml  Output 1545 ml  Net -845 ml   Filed Weights   03/20/18 0329 03/21/18 0259 03/22/18 0500  Weight: 61 kg 61.4 kg 61.4 kg    Exam: Awake Alert, Oriented *3, No new F.N deficits, Normal affect [place].AT,PERRAL Supple Neck,No JVD, No cervical lymphadenopathy appriciated.  Symmetrical Chest wall movement, Good air movement bilaterally, CTAB RRR,No Gallops,Rubs or new Murmurs, No Parasternal Heave +ve B.Sounds, Abd Soft, Non tender, No organomegaly appriciated, No rebound -guarding or rigidity. No Cyanosis, Clubbing or edema, No new Rash or bruise   PERTINENT RADIOLOGIC STUDIES: Dg Chest 2 View  Result Date: 02/26/2018 CLINICAL DATA:  Cough productive of green mucus, shortness of breath, and central chest pain since yesterday, LEFT femur surgery 3 weeks ago, history hypertension, diabetes mellitus, COPD, CHF, asthma, pulmonary embolism, TB exposure EXAM: CHEST - 2 VIEW COMPARISON:  None FINDINGS: Normal heart size, mediastinal contours, and pulmonary vascularity. Atherosclerotic calcification aorta. Emphysematous changes with focal LEFT lower lobe consolidation consistent with pneumonia. Minimal subsegmental atelectasis at RIGHT base. Remaining lungs clear. No pleural effusion or pneumothorax. Multiple old fractures of LEFT ribs and LEFT clavicle. Osseous demineralization. IMPRESSION: COPD changes with LEFT lower lobe pneumonia and mild RIGHT basilar atelectasis. Electronically Signed   By: Lavonia Dana M.D.   On: 02/26/2018 17:15   Ct Chest W Contrast  Result Date: 03/16/2018 CLINICAL DATA:  Mass-like consolidation in the left lower lobe on recent chest radiographs and abdomen and pelvis CT. The patient has had worsening nausea and vomiting and at the  time of the chest radiographs had a productive cough, shortness of breath and central chest pain. History of COPD, asthma, pulmonary embolism and TB exposure. The patient is a current smoker, smoking 2 packs of cigarettes per day with a 104 pack-year history of smoking. EXAM: CT CHEST WITH CONTRAST TECHNIQUE: Multidetector CT imaging of the chest was performed during intravenous contrast administration. CONTRAST:  60mL OMNIPAQUE IOHEXOL 300 MG/ML  SOLN COMPARISON:  Chest radiographs dated 02/26/2018 and abdomen and pelvis CT dated 03/16/2018. FINDINGS: Cardiovascular: Atheromatous calcifications, including the coronary arteries and aorta. Normal sized heart. Mediastinum/Nodes: Multiple enlarged mediastinal and bilateral hilar lymph nodes,  greatest in the left hilar region. These include a 12 mm short axis AP window node on image number 62 series 4, 10 mm short axis right anterior paratracheal node on image number 43 series 4, 14 mm short axis subcarinal node on image number 68 series 4, 12 mm short axis right hilar node on image number 70 series 4 and a 17 mm short axis left hilar node on image number 86 series 4. Unremarkable thyroid gland and esophagus. Lungs/Pleura: Patchy and dense, mass-like airspace consolidation in the posterior aspect of the left lower lobe with a prominent oval central low density component measuring 6.1 x 4.2 cm on image number 99 series 4. There is also mucous plugging in multiple left lower lobe bronchi. This is relatively low density with small amount of interspersed air. Mild diffuse peribronchial thickening and mild centrilobular bullous changes. Small amount of bilateral linear atelectasis or scarring. No pleural fluid. Upper Abdomen: Atheromatous arterial calcifications without aneurysm. Mild diffuse low density of the liver relative to the spleen. Musculoskeletal: Old, healed bilateral rib fractures. Thoracic and lower cervical spine degenerative changes. IMPRESSION: 1. 6.1 x 4.2  cm mass-like area of consolidation in the posterior aspect of the left lower lobe with a central low density component. This has an appearance most compatible with necrotizing pneumonia and possible lung abscess. An underlying cavitary neoplasm is less likely but not excluded. 2. Mucous plugging involving multiple left lower lobe bronchi. 3. Mild mediastinal and bilateral hilar adenopathy, most likely reactive. Metastatic adenopathy is also a possibility. 4.  Calcific coronary artery and aortic atherosclerosis. 5. Mild changes of COPD and chronic bronchitis. 6. Mild diffuse hepatic steatosis. Aortic Atherosclerosis (ICD10-I70.0) and Emphysema (ICD10-J43.9). Electronically Signed   By: Claudie Revering M.D.   On: 03/16/2018 14:54   Ct Abdomen Pelvis W Contrast  Result Date: 03/16/2018 CLINICAL DATA:  Nausea, vomiting, worsening EXAM: CT ABDOMEN AND PELVIS WITH CONTRAST TECHNIQUE: Multidetector CT imaging of the abdomen and pelvis was performed using the standard protocol following bolus administration of intravenous contrast. CONTRAST:  163mL OMNIPAQUE IOHEXOL 300 MG/ML  SOLN COMPARISON:  Chest radiograph 02/26/2018 FINDINGS: Lower chest: There is a partially included very dense, masslike consolidation of the left lower lobe, in keeping with findings of prior radiograph (series 3, image 1). Hepatobiliary: No focal liver abnormality is seen. No gallstones, gallbladder wall thickening, or biliary dilatation. Pancreas: Unremarkable. No pancreatic ductal dilatation or surrounding inflammatory changes. Spleen: Splenomegaly, maximum span approximately 15.9 cm. Adrenals/Urinary Tract: Adrenal glands are unremarkable. Kidneys are normal, without renal calculi, focal lesion, or hydronephrosis. Bladder is unremarkable. Stomach/Bowel: Stomach is within normal limits. Appendix not clearly visualized. No evidence of bowel wall thickening, distention, or inflammatory changes. Large burden of stool in the colon. 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 No enlarged abdominal or pelvic lymph nodes. Reproductive: No mass or other abnormality. Other: No abdominal wall hernia or abnormality. No abdominopelvic ascites. Musculoskeletal: No acute or significant osseous findings. IMPRESSION: 1. There is a partially included very dense, masslike consolidation of the left lower lobe, in keeping with findings of prior radiograph (series 3, image 1). Findings are concerning for infection or aspiration, particularly necrotizing infection given the appearance. Underlying mass is not excluded and at  minimum, follow-up CT or radiographs are warranted at 6-8 weeks to document complete resolution. 2.  Splenomegaly. 3. 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 Recommend vascular evaluation. Electronically Signed   By: Eddie Candle M.D.   On: 03/16/2018 11:56   Dg Abd Acute 2+v W 1v Chest  Result Date: 03/02/2018 CLINICAL DATA:  Cough nausea and vomiting EXAM: DG ABDOMEN ACUTE W/ 1V CHEST COMPARISON:  CT 01/21/2018, chest x-ray 01/21/2018 FINDINGS: Single-view chest demonstrates left perihilar airspace disease. Normal heart size. Old left upper rib deformities. Aortic atherosclerosis. Supine and decubitus views of the abdomen demonstrate no free air. Nonobstructed bowel-gas pattern with moderate stool. Left iliac stent. Clips in the right groin. Surgical hardware in the left femur. Faint linear calcifications in the region of the renal hilus, likely reflecting intrarenal vascular calcification. IMPRESSION: 1.  Left perihilar airspace disease concerning for a pneumonia 2. Nonobstructed bowel-gas pattern Electronically Signed   By: Donavan Foil M.D.   On: 03/02/2018 23:50     PERTINENT LAB RESULTS: CBC: Recent Labs    03/20/18 0529  WBC 10.6*  HGB 8.7*  HCT 28.7*  PLT 410*   CMET CMP     Component Value Date/Time   NA 135 03/20/2018 0529   NA 140 01/18/2018 1621   K 5.0 03/20/2018 0529   CL 102 03/20/2018 0529   CO2 26 03/20/2018 0529   GLUCOSE 115 (H) 03/20/2018 0529   BUN 12 03/20/2018 0529   BUN 16 01/18/2018 1621   CREATININE 0.85 03/20/2018 0529   CREATININE 0.85 05/10/2014 1114   CALCIUM 8.4 (L) 03/20/2018 0529   CALCIUM 9.1 10/28/2009 2118   PROT 6.5 03/17/2018 0438   PROT 7.4 01/18/2018 1621   ALBUMIN 1.8 (L) 03/17/2018 0438   ALBUMIN 4.1 01/18/2018 1621   AST 23 03/17/2018 0438   ALT 23 03/17/2018 0438   ALT PLASMA 05/26/2017 1855   ALKPHOS 89 03/17/2018 0438   BILITOT 0.3 03/17/2018 0438   BILITOT 0.2 01/18/2018 1621   GFRNONAA >60 03/20/2018 0529   GFRNONAA >89 02/01/2014 1518   GFRAA >60 03/20/2018 0529   GFRAA >89 02/01/2014 1518    GFR Estimated Creatinine Clearance: 81.3 mL/min (by C-G formula based on SCr of 0.85 mg/dL). No results for input(s): LIPASE, AMYLASE in the last 72 hours. No results for input(s): CKTOTAL, CKMB, CKMBINDEX, TROPONINI in the last 72 hours. Invalid input(s): POCBNP No results for input(s): DDIMER in the last 72 hours. No results for input(s): HGBA1C in the last 72 hours. No results for input(s): CHOL, HDL, LDLCALC, TRIG, CHOLHDL, LDLDIRECT in the last 72 hours. No results for input(s): TSH, T4TOTAL, T3FREE, THYROIDAB in the last 72 hours.  Invalid input(s): FREET3 No results for input(s): VITAMINB12, FOLATE, FERRITIN, TIBC, IRON, RETICCTPCT in the last 72 hours. Coags: No results for input(s): INR in the last 72 hours.  Invalid input(s): PT Microbiology: Recent Results (from the past 240 hour(s))  Blood culture (routine  x 2)     Status: Abnormal   Collection Time: 03/16/18  1:20 PM  Result Value Ref Range Status   Specimen Description BLOOD LEFT HAND  Final   Special Requests   Final    BOTTLES DRAWN AEROBIC ONLY Blood Culture results may not be optimal due to an inadequate volume of blood received in culture bottles   Culture  Setup Time  Final    AEROBIC BOTTLE ONLY GRAM POSITIVE COCCI CRITICAL RESULT CALLED TO, READ BACK BY AND VERIFIED WITH: L SEAY PHARMD 03/18/18 0105 JDW    Culture (A)  Final    STAPHYLOCOCCUS SPECIES (COAGULASE NEGATIVE) THE SIGNIFICANCE OF ISOLATING THIS ORGANISM FROM A SINGLE SET OF BLOOD CULTURES WHEN MULTIPLE SETS ARE DRAWN IS UNCERTAIN. PLEASE NOTIFY THE MICROBIOLOGY DEPARTMENT WITHIN ONE WEEK IF SPECIATION AND SENSITIVITIES ARE REQUIRED. Performed at [facility], [place] [address] [place], [place] [address]    Report Status 03/21/2018 FINAL  Final  Blood Culture ID Panel (Reflexed)     Status: Abnormal   Collection Time: 03/16/18  1:20 PM  Result Value Ref Range Status   Enterococcus species NOT DETECTED NOT DETECTED Final   Listeria monocytogenes NOT DETECTED NOT DETECTED Final   Staphylococcus species DETECTED (A) NOT DETECTED Final    Comment: Methicillin (oxacillin) resistant coagulase negative staphylococcus. Possible blood culture contaminant (unless isolated from more than one blood culture draw or clinical case suggests pathogenicity). No antibiotic treatment is indicated for blood  culture contaminants. CRITICAL RESULT CALLED TO, READ BACK BY AND VERIFIED WITH: L SEAY PHARMD 03/18/18 0105 JDW    Staphylococcus aureus (BCID) NOT DETECTED NOT DETECTED Final   Methicillin resistance DETECTED (A) NOT DETECTED Final    Comment: CRITICAL RESULT CALLED TO, READ BACK BY AND VERIFIED WITH: L SEAY PHARMD 03/18/18 0105 JDW    Streptococcus species NOT DETECTED NOT DETECTED Final   Streptococcus agalactiae NOT DETECTED NOT DETECTED Final   Streptococcus pneumoniae NOT  DETECTED NOT DETECTED Final   Streptococcus pyogenes NOT DETECTED NOT DETECTED Final   Acinetobacter baumannii NOT DETECTED NOT DETECTED Final   Enterobacteriaceae species NOT DETECTED NOT DETECTED Final   Enterobacter cloacae complex NOT DETECTED NOT DETECTED Final   Escherichia coli NOT DETECTED NOT DETECTED Final   Klebsiella oxytoca NOT DETECTED NOT DETECTED Final   Klebsiella pneumoniae NOT DETECTED NOT DETECTED Final   Proteus species NOT DETECTED NOT DETECTED Final   Serratia marcescens NOT DETECTED NOT DETECTED Final   Haemophilus influenzae NOT DETECTED NOT DETECTED Final   Neisseria meningitidis NOT DETECTED NOT DETECTED Final   Pseudomonas aeruginosa NOT DETECTED NOT DETECTED Final   Candida albicans NOT DETECTED NOT DETECTED Final   Candida glabrata NOT DETECTED NOT DETECTED Final   Candida krusei NOT DETECTED NOT DETECTED Final   Candida parapsilosis NOT DETECTED NOT DETECTED Final   Candida tropicalis NOT DETECTED NOT DETECTED Final    Comment: Performed at [facility], [place]. [address], [place], [place] [address]  Urine Culture     Status: None   Collection Time: 03/16/18  4:29 PM  Result Value Ref Range Status   Specimen Description URINE, CLEAN CATCH  Final   Special Requests NONE  Final   Culture   Final    NO GROWTH Performed at [facility], [place] [address], [place], [place] [address]    Report Status 03/17/2018 FINAL  Final  MRSA PCR Screening     Status: None   Collection Time: 03/16/18  5:40 PM  Result Value Ref Range Status   MRSA by PCR NEGATIVE NEGATIVE Final    Comment:        The GeneXpert MRSA Assay (FDA approved for NASAL specimens only), is one component of a comprehensive MRSA colonization surveillance program. It is not intended to diagnose MRSA infection nor to guide or monitor treatment for MRSA infections. Performed at [facility], [address] [place], [place] [address]   Blood culture (routine x 2)     Status: None    Collection Time: 03/16/18  6:58 PM  Result Value Ref Range Status   Specimen Description BLOOD LEFT ANTECUBITAL  Final   Special Requests   Final    BOTTLES DRAWN AEROBIC AND ANAEROBIC Blood Culture adequate volume   Culture   Final    NO GROWTH 5 DAYS Performed at [facility], [place] [address], [place], [place] [address]    Report Status 03/21/2018 FINAL  Final    FURTHER DISCHARGE INSTRUCTIONS:  Get Medicines reviewed and adjusted: Please take all your medications with you for your next visit with your Primary MD  Laboratory/radiological data: Please request your Primary MD to go over all hospital tests and procedure/radiological results at the follow up, please ask your Primary MD to get all Hospital records sent to his/her office.  In some cases, they will be blood work, cultures and biopsy results pending at the time of your discharge. Please request that your primary care M.D. goes through all the records of your hospital data and follows up on these results.  Also Note the following: If you experience worsening of your admission symptoms, develop shortness of breath, life threatening emergency, suicidal or homicidal thoughts you must seek medical attention immediately by calling 911 or calling your MD immediately  if symptoms less severe.  You must read complete instructions/literature along with all the possible adverse reactions/side effects for all the Medicines you take and that have been prescribed to you. Take any new Medicines after you have completely understood and accpet all the possible adverse reactions/side effects.   Do not drive when taking Pain medications or sleeping medications (Benzodaizepines)  Do not take more than prescribed Pain, Sleep and Anxiety Medications. It is not advisable to combine anxiety,sleep and pain medications without talking with your primary care practitioner  Special Instructions: If you have smoked or chewed Tobacco  in the last  2 yrs please stop smoking, stop any regular Alcohol  and or any Recreational drug use.  Wear Seat belts while driving.  Please note: You were cared for by a hospitalist during your hospital stay. Once you are discharged, your primary care physician will handle any further medical issues. Please note that NO REFILLS for any discharge medications will be authorized once you are discharged, as it is imperative that you return to your primary care physician (or establish a relationship with a primary care physician if you do not have one) for your post hospital discharge needs so that they can reassess your need for medications and monitor your lab values.  Total Time spent coordinating discharge including counseling, education and face to face time equals 35 minutes.  SignedOren Binet 03/22/2018 8:03 AM

## 2018-03-24 ENCOUNTER — Other Ambulatory Visit: Payer: Self-pay

## 2018-03-28 ENCOUNTER — Telehealth: Payer: Self-pay | Admitting: Family Medicine

## 2018-03-28 NOTE — Telephone Encounter (Signed)
Patient verified DOB MA provided VO for patient to have home care and PT for his prosthetic leg.

## 2018-03-28 NOTE — Telephone Encounter (Signed)
Pt stated he wanted to have a check up for pneumonia advised pt if he needed to be seen sooner to go to ED or Urgent care

## 2018-03-28 NOTE — Telephone Encounter (Signed)
[facility] called for verbal orders

## 2018-04-05 ENCOUNTER — Emergency Department (HOSPITAL_COMMUNITY)
Admission: EM | Admit: 2018-04-05 | Discharge: 2018-04-05 | Disposition: A | Discharge status: Home / Self Care | Payer: Medicaid Other | Attending: Emergency Medicine | Admitting: Emergency Medicine

## 2018-04-05 ENCOUNTER — Encounter (HOSPITAL_COMMUNITY): Payer: Self-pay | Admitting: Emergency Medicine

## 2018-04-05 ENCOUNTER — Emergency Department (HOSPITAL_COMMUNITY): Payer: Medicaid Other

## 2018-04-05 ENCOUNTER — Inpatient Hospital Stay: Payer: Medicaid Other | Admitting: Family

## 2018-04-05 DIAGNOSIS — E119 Type 2 diabetes mellitus without complications: Secondary | ICD-10-CM | POA: Insufficient documentation

## 2018-04-05 DIAGNOSIS — R0602 Shortness of breath: Secondary | ICD-10-CM | POA: Diagnosis present

## 2018-04-05 DIAGNOSIS — R0600 Dyspnea, unspecified: Secondary | ICD-10-CM | POA: Diagnosis not present

## 2018-04-05 DIAGNOSIS — F1110 Opioid abuse, uncomplicated: Secondary | ICD-10-CM | POA: Diagnosis not present

## 2018-04-05 DIAGNOSIS — Z8673 Personal history of transient ischemic attack (TIA), and cerebral infarction without residual deficits: Secondary | ICD-10-CM | POA: Insufficient documentation

## 2018-04-05 DIAGNOSIS — I110 Hypertensive heart disease with heart failure: Secondary | ICD-10-CM | POA: Diagnosis not present

## 2018-04-05 DIAGNOSIS — J441 Chronic obstructive pulmonary disease with (acute) exacerbation: Secondary | ICD-10-CM | POA: Insufficient documentation

## 2018-04-05 DIAGNOSIS — F1410 Cocaine abuse, uncomplicated: Secondary | ICD-10-CM | POA: Diagnosis not present

## 2018-04-05 DIAGNOSIS — Z79899 Other long term (current) drug therapy: Secondary | ICD-10-CM | POA: Diagnosis not present

## 2018-04-05 DIAGNOSIS — F17210 Nicotine dependence, cigarettes, uncomplicated: Secondary | ICD-10-CM | POA: Diagnosis not present

## 2018-04-05 DIAGNOSIS — I509 Heart failure, unspecified: Secondary | ICD-10-CM | POA: Insufficient documentation

## 2018-04-05 LAB — CBC WITH DIFFERENTIAL/PLATELET
ABS IMMATURE GRANULOCYTES: 0.03 10*3/uL (ref 0.00–0.07)
Basophils Absolute: 0 10*3/uL (ref 0.0–0.1)
Basophils Relative: 0 %
Eosinophils Absolute: 0.2 10*3/uL (ref 0.0–0.5)
Eosinophils Relative: 2 %
HCT: 32.4 % — ABNORMAL LOW (ref 39.0–52.0)
Hemoglobin: 9.6 g/dL — ABNORMAL LOW (ref 13.0–17.0)
Immature Granulocytes: 0 %
LYMPHS PCT: 27 %
Lymphs Abs: 2.1 10*3/uL (ref 0.7–4.0)
MCH: 22.1 pg — ABNORMAL LOW (ref 26.0–34.0)
MCHC: 29.6 g/dL — ABNORMAL LOW (ref 30.0–36.0)
MCV: 74.5 fL — ABNORMAL LOW (ref 80.0–100.0)
Monocytes Absolute: 0.6 10*3/uL (ref 0.1–1.0)
Monocytes Relative: 7 %
Neutro Abs: 4.9 10*3/uL (ref 1.7–7.7)
Neutrophils Relative %: 64 %
Platelets: 423 10*3/uL — ABNORMAL HIGH (ref 150–400)
RBC: 4.35 MIL/uL (ref 4.22–5.81)
RDW: 19.1 % — ABNORMAL HIGH (ref 11.5–15.5)
WBC: 7.8 10*3/uL (ref 4.0–10.5)
nRBC: 0 % (ref 0.0–0.2)

## 2018-04-05 LAB — COMPREHENSIVE METABOLIC PANEL
ALT: 11 U/L (ref 0–44)
ANION GAP: 6 (ref 5–15)
AST: 19 U/L (ref 15–41)
Albumin: 2.7 g/dL — ABNORMAL LOW (ref 3.5–5.0)
Alkaline Phosphatase: 88 U/L (ref 38–126)
BUN: 7 mg/dL (ref 6–20)
CO2: 26 mmol/L (ref 22–32)
Calcium: 8.6 mg/dL — ABNORMAL LOW (ref 8.9–10.3)
Chloride: 104 mmol/L (ref 98–111)
Creatinine, Ser: 0.86 mg/dL (ref 0.61–1.24)
GFR calc Af Amer: >60 mL/min (ref >60)
GFR calc non Af Amer: >60 mL/min (ref >60)
Glucose, Bld: 135 mg/dL — ABNORMAL HIGH (ref 70–99)
Potassium: 3.2 mmol/L — ABNORMAL LOW (ref 3.5–5.1)
SODIUM: 136 mmol/L (ref 135–145)
Total Bilirubin: 0.6 mg/dL (ref 0.3–1.2)
Total Protein: 7.9 g/dL (ref 6.5–8.1)

## 2018-04-05 LAB — BRAIN NATRIURETIC PEPTIDE: B Natriuretic Peptide: 60.4 pg/mL (ref 0.0–100.0)

## 2018-04-05 MED ORDER — DEXAMETHASONE SODIUM PHOSPHATE 10 MG/ML IJ SOLN
10.0000 mg | Freq: Once | INTRAMUSCULAR | Status: AC
  Administered 2018-04-05: 10 mg via INTRAMUSCULAR
  Filled 2018-04-05: qty 1

## 2018-04-05 MED ORDER — PREDNISONE 20 MG PO TABS: 40.0000 mg | ORAL_TABLET | Freq: Every day | ORAL | 0 refills | Status: DC

## 2018-04-05 NOTE — ED Notes (Signed)
[facility] called for transport.  
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
## 2018-04-05 NOTE — ED Triage Notes (Signed)
Pt here from home with c/o sob times 3 days , no fevers pt was dx w\ith necrotizing PNA of left lung and due for follow up today for repeat ct

## 2018-04-05 NOTE — ED Notes (Signed)
Pt given food and beverage 

## 2018-04-05 NOTE — Discharge Instructions (Addendum)
In addition to the prescribed prednisone, please be sure to use your albuterol every 4 hours for the next few days. Return here for concerning changes in your condition, otherwise please be sure to schedule follow-up with your primary care physician.

## 2018-04-06 ENCOUNTER — Telehealth: Payer: Self-pay | Admitting: Family Medicine

## 2018-04-06 NOTE — Telephone Encounter (Signed)
Patient was seen within the past 24 hours in the ED. Please contact patient and schedule patient for office appointment in 3-4 weeks

## 2018-04-06 NOTE — Telephone Encounter (Signed)
FYI for PCP.

## 2018-04-06 NOTE — Telephone Encounter (Signed)
[name]  Case Management from [facility] wants to let you know that she did a medication reconciliation and  [name] is not taking Tramadol or Oxycode prescribe but he is buying Oxycodone in the streets, not monitoring his blood sugar , not taking Aspirin ,travastatin,guaifenesin and the Nicotine patches the only one he is taking  Is Ranitidine as needed . You can contact  Mrs  [name]  [phone] Thank you

## 2018-04-06 NOTE — Telephone Encounter (Signed)
kendra from [facility] wanted to advise that she came out to see pt but pt refused to be seen stated he didn't need them.

## 2018-04-07 NOTE — Telephone Encounter (Signed)
Patient needs to be scheduled an OV in 4 weeks with PCP per PCP.

## 2018-04-12 ENCOUNTER — Other Ambulatory Visit: Payer: Self-pay | Admitting: Family Medicine

## 2018-04-12 DIAGNOSIS — K219 Gastro-esophageal reflux disease without esophagitis: Secondary | ICD-10-CM

## 2018-04-13 ENCOUNTER — Other Ambulatory Visit: Payer: Self-pay | Admitting: Family Medicine

## 2018-04-17 ENCOUNTER — Emergency Department (HOSPITAL_COMMUNITY)
Admission: EM | Admit: 2018-04-17 | Discharge: 2018-04-18 | Disposition: A | Discharge status: Home / Self Care | Payer: Medicaid Other | Attending: Emergency Medicine | Admitting: Emergency Medicine

## 2018-04-17 ENCOUNTER — Encounter (HOSPITAL_COMMUNITY): Payer: Self-pay | Admitting: Emergency Medicine

## 2018-04-17 ENCOUNTER — Other Ambulatory Visit: Payer: Self-pay

## 2018-04-17 DIAGNOSIS — I110 Hypertensive heart disease with heart failure: Secondary | ICD-10-CM | POA: Insufficient documentation

## 2018-04-17 DIAGNOSIS — I509 Heart failure, unspecified: Secondary | ICD-10-CM | POA: Insufficient documentation

## 2018-04-17 DIAGNOSIS — Z79899 Other long term (current) drug therapy: Secondary | ICD-10-CM | POA: Insufficient documentation

## 2018-04-17 DIAGNOSIS — J441 Chronic obstructive pulmonary disease with (acute) exacerbation: Secondary | ICD-10-CM | POA: Insufficient documentation

## 2018-04-17 DIAGNOSIS — F17210 Nicotine dependence, cigarettes, uncomplicated: Secondary | ICD-10-CM | POA: Diagnosis not present

## 2018-04-17 DIAGNOSIS — R0602 Shortness of breath: Secondary | ICD-10-CM | POA: Diagnosis present

## 2018-04-17 DIAGNOSIS — E119 Type 2 diabetes mellitus without complications: Secondary | ICD-10-CM | POA: Diagnosis not present

## 2018-04-17 NOTE — ED Triage Notes (Signed)
Pt. BIB EMS from Home c/o SOB for past 2 months worsening tonight. Awaken from sleep "couldn't catch my breath." Pt. 100% on 15 L non-rebreather. Received 2 puff of albuterol and solu medrol on by EMS.  Pt. HX of COPD. Pt. With pneumonia and currently taking an antibiotic. Pt. Hx of smoking 1/2 to 1 pack a day since age 58. Last smoked about an 1 hour ago. Pt. Not on home oxygen.   Pt afeberil. Denies travel and being around someone sick.

## 2018-04-18 ENCOUNTER — Emergency Department (HOSPITAL_COMMUNITY): Payer: Medicaid Other

## 2018-04-18 LAB — CBC WITH DIFFERENTIAL/PLATELET
Abs Immature Granulocytes: 0.02 10*3/uL (ref 0.00–0.07)
Basophils Absolute: 0 10*3/uL (ref 0.0–0.1)
Basophils Relative: 0 %
Eosinophils Absolute: 0.2 10*3/uL (ref 0.0–0.5)
Eosinophils Relative: 2 %
HCT: 33.7 % — ABNORMAL LOW (ref 39.0–52.0)
Hemoglobin: 10 g/dL — ABNORMAL LOW (ref 13.0–17.0)
Immature Granulocytes: 0 %
Lymphocytes Relative: 20 %
Lymphs Abs: 1.5 10*3/uL (ref 0.7–4.0)
MCH: 22.7 pg — ABNORMAL LOW (ref 26.0–34.0)
MCHC: 29.7 g/dL — ABNORMAL LOW (ref 30.0–36.0)
MCV: 76.6 fL — ABNORMAL LOW (ref 80.0–100.0)
Monocytes Absolute: 0.3 10*3/uL (ref 0.1–1.0)
Monocytes Relative: 4 %
Neutro Abs: 5.3 10*3/uL (ref 1.7–7.7)
Neutrophils Relative %: 74 %
Platelets: 361 10*3/uL (ref 150–400)
RBC: 4.4 MIL/uL (ref 4.22–5.81)
RDW: 19.3 % — ABNORMAL HIGH (ref 11.5–15.5)
WBC: 7.2 10*3/uL (ref 4.0–10.5)
nRBC: 0 % (ref 0.0–0.2)

## 2018-04-18 LAB — COMPREHENSIVE METABOLIC PANEL
ALT: 13 U/L (ref 0–44)
AST: 14 U/L — ABNORMAL LOW (ref 15–41)
Albumin: 3.1 g/dL — ABNORMAL LOW (ref 3.5–5.0)
Alkaline Phosphatase: 101 U/L (ref 38–126)
Anion gap: 12 (ref 5–15)
BUN: 16 mg/dL (ref 6–20)
CO2: 23 mmol/L (ref 22–32)
Calcium: 9 mg/dL (ref 8.9–10.3)
Chloride: 97 mmol/L — ABNORMAL LOW (ref 98–111)
Creatinine, Ser: 0.72 mg/dL (ref 0.61–1.24)
GFR calc Af Amer: >60 mL/min (ref >60)
GFR calc non Af Amer: >60 mL/min (ref >60)
Glucose, Bld: 129 mg/dL — ABNORMAL HIGH (ref 70–99)
Potassium: 4.1 mmol/L (ref 3.5–5.1)
Sodium: 132 mmol/L — ABNORMAL LOW (ref 135–145)
Total Bilirubin: 0.2 mg/dL — ABNORMAL LOW (ref 0.3–1.2)
Total Protein: 8.1 g/dL (ref 6.5–8.1)

## 2018-04-18 MED ORDER — PREDNISONE 10 MG PO TABS: 50.0000 mg | ORAL_TABLET | Freq: Every day | ORAL | 0 refills | Status: DC

## 2018-04-18 MED ORDER — ALBUTEROL SULFATE HFA 108 (90 BASE) MCG/ACT IN AERS
4.0000 | INHALATION_SPRAY | RESPIRATORY_TRACT | Status: DC | PRN
  Administered 2018-04-18 (×2): 4 via RESPIRATORY_TRACT
  Filled 2018-04-18: qty 6.7

## 2018-04-18 NOTE — Discharge Instructions (Addendum)
Presented the ER for shortness of breath. The x-ray today looks better than it did last time we saw you.  Additionally your lungs improved after breathing treatments.  We suspect that you have a COPD exacerbation please take the medications prescribed. Return to the ER if your symptoms get worse.

## 2018-04-18 NOTE — Progress Notes (Signed)
Peak Flow Results  Greatest of 3 attempts= 170 litres/min

## 2018-04-18 NOTE — Progress Notes (Signed)
Patient ID: [name], male   DOB: 11-20-1960, 58 y.o.   MRN: [id number] Virtual Visit via Telephone Note  I connected with [name] on 04/18/18 at  2:00 PM EDT by telephone and verified that I am speaking with the correct person using two identifiers.   I discussed the limitations, risks, security and privacy concerns of performing an evaluation and management service by telephone and the availability of in person appointments. I also discussed with the patient that there may be a patient responsible charge related to this service. The patient expressed understanding and agreed to proceed.   History of Present Illness: This is a 58 year old male seen by way of a telephone visit.  Note the patient was on the phone by himself.  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  The discharge summary is excerpted as below Brief Summary: See H&P, Labs, Consult and Test reports for all details in brief,Patient is a57 y.o.male with history of tobacco use, cocaine use, COPD, DM-2, right AKA October 2019 secondary to right leg ischemia, no fracture-s/p ORIF on 1/11 presented with several weeks history of cough, subjective fever, vomiting, found to have left lung necrotizing pneumonia and admitted to the hospitalist service.   Brief Hospital Course: Necrotizing pneumonia of left lung: Slowly improving-afebrile-leukocytosis has resolved. 1/2 blood culture positive for MRSE likely contamination. ID consulted, remained on  on IV Unasyn-we will plan to transition to Augmentin on discharge. Follow up  with [facility] Clinic arranged. Repeat CT Chest in 4 weeks to ensure resolution of lung abscess-if still persistent may need work up for underlying neoplasm IMPRESSION: 1. 6.1 x 4.2 cm mass-like area of consolidation in the posterior aspect of the left lower lobe with a central low density component. This has an appearance most compatible with necrotizing pneumonia and possible lung abscess. An underlying cavitary neoplasm is less likely but not excluded. 2. Mucous plugging involving multiple left lower lobe bronchi. 3. Mild mediastinal and bilateral hilar adenopathy, most likely reactive. Metastatic adenopathy is also a possibility. 4.  Calcific coronary artery and aortic atherosclerosis. 5. Mild changes of COPD and chronic bronchitis. 6. Mild diffuse hepatic steatosis. Oral thrush:Finished a weeks course of nystatin-much better  Anemia: Appears to be microcytic-ferritin levels are normal-suspect has anemia of chronic disease-worsened by acute illness. Hemoglobin stable at 8.7. Follow periodically.   Dehydration:Volume status improved with IVF-now euvolemic.   DM-2:CBGs stable with SSI-diet controlled in the outpatient setting  COPD/asthma: stable-continue usual bronchodilators.  GERD: Continue PPI  CVA/PAD s/p right AKA: Continue aspirin/Plavix and statin  OSA: CPAP nightly-but non compliant  Severe protein calorie malnutrition:Continue supplements  Tobacco/cocaine use: Counseled-not sure if he has any indication of quitting at this point.  Chronic pain syndrome: Continue Lyrica and as needed narcotics-since has chronic pain-needs to follow with his outpatient MD's for narcotic refills.  Debility/deconditioning:Secondary to necrotizing pneumonia of the left lung, dehydration-social work consult consulted for SNF discharge-but patient refused-wants to go home with Home health services   Note the patient was recommended to go to nursing home however he declined that  he wanted to go home with home health services.  The patient subsequently  back to the emergency room on 5 April with shortness of breath and cough.  He was worried about a pressure ulcer in the gluteal area however this was unremarkable and the patient desires a second opinion on this area.  The patient's emergency room visit is as noted below ED visit 71/60 58 year old male comes in with chief complaint of shortness of breath.  He is also complaining of pressure ulcer in his gluteal region.  Patient has extensive smoking history and has known COPD and peripheral vascular arterial disease.  He also has CHF, hypertension hyperlipidemia.  Patient was recently diagnosed with pneumonia and was started on antibiotics.  It appears that he start having shortness of breath after he had stopped smoking.  Prior to that he was having some cough and mild shortness of breath.  He has no fevers.  On evaluation patient has clear wheezing diffusely.  Initial impression is that this is likely a COPD exacerbation.  Other possibilities include worsening pneumonia.  The patient was treated with a pulsed dose of prednisone.  The patient is now completing a course of antibiotics at this time.  The patient continues to have significant shortness of breath.  Patient has minimal mucus.  There is no chest pain.  The patient continues to smoke 1 pack a day of cigarettes.  He states when he was on nicotine inhalers and patches in the past this helped.  Patient is requesting refill on his Lyrica.  Patient notes increased swallowing dysfunction and food sticking in the throat.  He is interested in further evaluation of this as well.   Pos in BOLD Constitutional:   No  weight loss, night sweats,  Fevers, chills, fatigue, lassitude. HEENT:   No headaches,  Difficulty swallowing,  Tooth/dental problems,  Sore throat,                No sneezing, itching, ear ache, nasal congestion, post nasal drip,   dysphagia  CV:   chest  pain,  Orthopnea, PND, swelling in lower extremities, anasarca, dizziness, palpitations  GI  No heartburn, indigestion, abdominal pain, nausea, vomiting, diarrhea, change in bowel habits, loss of appetite  Resp: shortness of breath with exertion and at rest.  No excess mucus, no productive cough,   non-productive cough,  No coughing up of blood.  No change in color of mucus.  No wheezing.  No chest wall deformity  Skin: no rash or lesions.  GU: no dysuria, change in color of urine, no urgency or frequency.  No flank pain.  MS:  No joint pain or swelling.  No decreased range of motion. back pain.  Psych:  No change in mood or affect. No depression or anxiety.  No memory loss.  All laboratory data and imaging studies from recent hospitalizations are reviewed  Observations/Objective: No observations made as this was a telephone visit [place] Controlled database reviewed Assessment and Plan: #1 gluteal decubitus, per emergency room report this is minimal and it would benefit the patient with a plastic surgery evaluation and we will make this referral  #2 difficulty with swallowing with left lower lobe aspiration pneumonia  Will refer to gastroenterology to see if an esophagram or upper endoscopy can be performed note with the coronavirus this will likely be delayed  #3 COPD on albuterol alone with ongoing tobacco use  Will recommend nicotine patches and begin Symbicort 2 puffs twice daily 160 strength  Will also administer prednisone 50 mg daily for 5 days for airway inflammation, note no  additional antibiotics are indicated  #4 the patient has had poor access to Ensure supplements we will ask case management to address this with his nutrition syndrome    Follow Up Instructions: The patient understands he will have a follow-up telemetry visit within the next few weeks and also we will work on a gastroenterology and plastic surgery referral as he knows he is will be delayed because of the  coronavirus  The medications prescribed including prednisone Symbicort and nicotine patches can be picked up from our pharmacy and he is aware of this  Nurse case management was also requested to look into a home health aide along with Ensure supplement  The Lyrica was sent to an alternative pharmacy that he can access   I discussed the assessment and treatment plan with the patient. The patient was provided an opportunity to ask questions and all were answered. The patient agreed with the plan and demonstrated an understanding of the instructions.   The patient was advised to call back or seek an in-person evaluation if the symptoms worsen or if the condition fails to improve as anticipated.  I provided 15 minutes of non-face-to-face time during this encounter.   Asencion Noble, MD

## 2018-04-18 NOTE — ED Notes (Addendum)
Discharge instructions and medications discussed with pt. Pt. Has no questions at this time. Pt. To go home with [facility].  Unable to obtain ED Discharge signature. Pad currently not working.
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
## 2018-04-18 NOTE — ED Notes (Signed)
Called [facility] for transport home--Yoona Ishii

## 2018-04-18 NOTE — ED Notes (Signed)
Nurse currently collecting labs. 

## 2018-04-18 NOTE — ED Notes (Signed)
Spoke to [place] with Respiratory requesting assessment of peak flow.

## 2018-04-19 ENCOUNTER — Ambulatory Visit (HOSPITAL_BASED_OUTPATIENT_CLINIC_OR_DEPARTMENT_OTHER): Payer: Medicaid Other | Admitting: Critical Care Medicine

## 2018-04-19 ENCOUNTER — Encounter: Payer: Self-pay | Admitting: Critical Care Medicine

## 2018-04-19 ENCOUNTER — Other Ambulatory Visit: Payer: Self-pay

## 2018-04-19 DIAGNOSIS — Z89611 Acquired absence of right leg above knee: Secondary | ICD-10-CM | POA: Insufficient documentation

## 2018-04-19 DIAGNOSIS — F17210 Nicotine dependence, cigarettes, uncomplicated: Secondary | ICD-10-CM

## 2018-04-19 DIAGNOSIS — F1910 Other psychoactive substance abuse, uncomplicated: Secondary | ICD-10-CM

## 2018-04-19 DIAGNOSIS — E860 Dehydration: Secondary | ICD-10-CM

## 2018-04-19 DIAGNOSIS — E43 Unspecified severe protein-calorie malnutrition: Secondary | ICD-10-CM | POA: Insufficient documentation

## 2018-04-19 DIAGNOSIS — Z7902 Long term (current) use of antithrombotics/antiplatelets: Secondary | ICD-10-CM | POA: Insufficient documentation

## 2018-04-19 DIAGNOSIS — J440 Chronic obstructive pulmonary disease with acute lower respiratory infection: Secondary | ICD-10-CM | POA: Insufficient documentation

## 2018-04-19 DIAGNOSIS — G4733 Obstructive sleep apnea (adult) (pediatric): Secondary | ICD-10-CM

## 2018-04-19 DIAGNOSIS — J690 Pneumonitis due to inhalation of food and vomit: Secondary | ICD-10-CM | POA: Insufficient documentation

## 2018-04-19 DIAGNOSIS — E785 Hyperlipidemia, unspecified: Secondary | ICD-10-CM | POA: Insufficient documentation

## 2018-04-19 DIAGNOSIS — R1310 Dysphagia, unspecified: Secondary | ICD-10-CM

## 2018-04-19 DIAGNOSIS — Z720 Tobacco use: Secondary | ICD-10-CM

## 2018-04-19 DIAGNOSIS — E119 Type 2 diabetes mellitus without complications: Secondary | ICD-10-CM

## 2018-04-19 DIAGNOSIS — Z8673 Personal history of transient ischemic attack (TIA), and cerebral infarction without residual deficits: Secondary | ICD-10-CM

## 2018-04-19 DIAGNOSIS — I6529 Occlusion and stenosis of unspecified carotid artery: Secondary | ICD-10-CM | POA: Insufficient documentation

## 2018-04-19 DIAGNOSIS — L89309 Pressure ulcer of unspecified buttock, unspecified stage: Secondary | ICD-10-CM | POA: Insufficient documentation

## 2018-04-19 DIAGNOSIS — I700 Atherosclerosis of aorta: Secondary | ICD-10-CM | POA: Insufficient documentation

## 2018-04-19 DIAGNOSIS — Z9119 Patient's noncompliance with other medical treatment and regimen: Secondary | ICD-10-CM | POA: Insufficient documentation

## 2018-04-19 DIAGNOSIS — R5381 Other malaise: Secondary | ICD-10-CM

## 2018-04-19 DIAGNOSIS — J418 Mixed simple and mucopurulent chronic bronchitis: Secondary | ICD-10-CM

## 2018-04-19 DIAGNOSIS — E1151 Type 2 diabetes mellitus with diabetic peripheral angiopathy without gangrene: Secondary | ICD-10-CM

## 2018-04-19 DIAGNOSIS — D649 Anemia, unspecified: Secondary | ICD-10-CM | POA: Insufficient documentation

## 2018-04-19 DIAGNOSIS — L89311 Pressure ulcer of right buttock, stage 1: Secondary | ICD-10-CM

## 2018-04-19 DIAGNOSIS — K219 Gastro-esophageal reflux disease without esophagitis: Secondary | ICD-10-CM

## 2018-04-19 DIAGNOSIS — Z792 Long term (current) use of antibiotics: Secondary | ICD-10-CM | POA: Insufficient documentation

## 2018-04-19 DIAGNOSIS — I110 Hypertensive heart disease with heart failure: Secondary | ICD-10-CM | POA: Insufficient documentation

## 2018-04-19 DIAGNOSIS — J850 Gangrene and necrosis of lung: Secondary | ICD-10-CM | POA: Diagnosis not present

## 2018-04-19 DIAGNOSIS — Z7982 Long term (current) use of aspirin: Secondary | ICD-10-CM | POA: Insufficient documentation

## 2018-04-19 DIAGNOSIS — I509 Heart failure, unspecified: Secondary | ICD-10-CM

## 2018-04-19 DIAGNOSIS — Z79899 Other long term (current) drug therapy: Secondary | ICD-10-CM | POA: Insufficient documentation

## 2018-04-19 DIAGNOSIS — R1319 Other dysphagia: Secondary | ICD-10-CM

## 2018-04-19 DIAGNOSIS — I10 Essential (primary) hypertension: Secondary | ICD-10-CM

## 2018-04-19 DIAGNOSIS — G894 Chronic pain syndrome: Secondary | ICD-10-CM | POA: Insufficient documentation

## 2018-04-19 MED ORDER — PREGABALIN 75 MG PO CAPS: ORAL_CAPSULE | ORAL | 0 refills | Status: AC

## 2018-04-19 MED ORDER — BUDESONIDE-FORMOTEROL FUMARATE 160-4.5 MCG/ACT IN AERO: 2.0000 | INHALATION_SPRAY | Freq: Two times a day (BID) | RESPIRATORY_TRACT | 12 refills | Status: DC

## 2018-04-19 MED ORDER — PREDNISONE 10 MG PO TABS: 50.0000 mg | ORAL_TABLET | Freq: Every day | ORAL | 0 refills | Status: DC

## 2018-04-19 MED ORDER — NICOTINE 21 MG/24HR TD PT24: 21.0000 mg | MEDICATED_PATCH | Freq: Every day | TRANSDERMAL | 1 refills | Status: AC

## 2018-04-19 MED FILL — predniSONE 10 MG TABS: 10 | 4 days supply | Qty: 20 | Fill #0

## 2018-04-19 MED FILL — SYMBICORT 160-4.5 MCG INH: 160-4.5 | 30 days supply | Qty: 10 | Fill #0

## 2018-04-19 MED FILL — NICOTINE 21 MG/24HR PATCH: 21 | 28 days supply | Qty: 28 | Fill #0

## 2018-04-19 NOTE — Progress Notes (Signed)
Worked patient up for his televisit appointment with Dr. Joya Gaskins. Patient states that his breathing isn't doing good & he feels like the pneumonia never fully cleared. He also states that with the season change his COPD is bothering him

## 2018-04-19 NOTE — Addendum Note (Signed)
Addended by: Asencion Noble E on: 04/19/2018 05:17 PM   Modules accepted: Orders

## 2018-04-20 ENCOUNTER — Other Ambulatory Visit: Payer: Self-pay

## 2018-04-20 ENCOUNTER — Encounter (HOSPITAL_COMMUNITY): Payer: Self-pay

## 2018-04-20 ENCOUNTER — Emergency Department (HOSPITAL_COMMUNITY): Payer: Medicaid Other

## 2018-04-20 ENCOUNTER — Inpatient Hospital Stay (HOSPITAL_COMMUNITY)
Admission: EM | Admit: 2018-04-20 | Discharge: 2018-04-24 | DRG: 177 | Disposition: A | Discharge status: Home / Self Care | Payer: Medicaid Other | Attending: Internal Medicine | Admitting: Internal Medicine

## 2018-04-20 DIAGNOSIS — R0602 Shortness of breath: Secondary | ICD-10-CM | POA: Diagnosis present

## 2018-04-20 DIAGNOSIS — G894 Chronic pain syndrome: Secondary | ICD-10-CM | POA: Diagnosis present

## 2018-04-20 DIAGNOSIS — Z7951 Long term (current) use of inhaled steroids: Secondary | ICD-10-CM

## 2018-04-20 DIAGNOSIS — E1151 Type 2 diabetes mellitus with diabetic peripheral angiopathy without gangrene: Secondary | ICD-10-CM | POA: Diagnosis present

## 2018-04-20 DIAGNOSIS — Z20828 Contact with and (suspected) exposure to other viral communicable diseases: Secondary | ICD-10-CM | POA: Diagnosis present

## 2018-04-20 DIAGNOSIS — F1990 Other psychoactive substance use, unspecified, uncomplicated: Secondary | ICD-10-CM | POA: Diagnosis not present

## 2018-04-20 DIAGNOSIS — Z1611 Resistance to penicillins: Secondary | ICD-10-CM | POA: Diagnosis present

## 2018-04-20 DIAGNOSIS — D509 Iron deficiency anemia, unspecified: Secondary | ICD-10-CM | POA: Diagnosis present

## 2018-04-20 DIAGNOSIS — F410 Panic disorder [episodic paroxysmal anxiety] without agoraphobia: Secondary | ICD-10-CM | POA: Diagnosis present

## 2018-04-20 DIAGNOSIS — F17210 Nicotine dependence, cigarettes, uncomplicated: Secondary | ICD-10-CM | POA: Diagnosis present

## 2018-04-20 DIAGNOSIS — G546 Phantom limb syndrome with pain: Secondary | ICD-10-CM | POA: Diagnosis present

## 2018-04-20 DIAGNOSIS — L8990 Pressure ulcer of unspecified site, unspecified stage: Secondary | ICD-10-CM

## 2018-04-20 DIAGNOSIS — K5900 Constipation, unspecified: Secondary | ICD-10-CM | POA: Diagnosis present

## 2018-04-20 DIAGNOSIS — Z8673 Personal history of transient ischemic attack (TIA), and cerebral infarction without residual deficits: Secondary | ICD-10-CM

## 2018-04-20 DIAGNOSIS — Z89611 Acquired absence of right leg above knee: Secondary | ICD-10-CM

## 2018-04-20 DIAGNOSIS — E119 Type 2 diabetes mellitus without complications: Secondary | ICD-10-CM

## 2018-04-20 DIAGNOSIS — R636 Underweight: Secondary | ICD-10-CM | POA: Diagnosis present

## 2018-04-20 DIAGNOSIS — G8929 Other chronic pain: Secondary | ICD-10-CM | POA: Diagnosis not present

## 2018-04-20 DIAGNOSIS — K219 Gastro-esophageal reflux disease without esophagitis: Secondary | ICD-10-CM | POA: Diagnosis present

## 2018-04-20 DIAGNOSIS — Z8614 Personal history of Methicillin resistant Staphylococcus aureus infection: Secondary | ICD-10-CM

## 2018-04-20 DIAGNOSIS — Z981 Arthrodesis status: Secondary | ICD-10-CM

## 2018-04-20 DIAGNOSIS — I110 Hypertensive heart disease with heart failure: Secondary | ICD-10-CM | POA: Diagnosis present

## 2018-04-20 DIAGNOSIS — Z881 Allergy status to other antibiotic agents status: Secondary | ICD-10-CM

## 2018-04-20 DIAGNOSIS — K029 Dental caries, unspecified: Secondary | ICD-10-CM | POA: Diagnosis not present

## 2018-04-20 DIAGNOSIS — J418 Mixed simple and mucopurulent chronic bronchitis: Secondary | ICD-10-CM

## 2018-04-20 DIAGNOSIS — Z79891 Long term (current) use of opiate analgesic: Secondary | ICD-10-CM

## 2018-04-20 DIAGNOSIS — M79672 Pain in left foot: Secondary | ICD-10-CM | POA: Diagnosis not present

## 2018-04-20 DIAGNOSIS — J431 Panlobular emphysema: Secondary | ICD-10-CM | POA: Diagnosis not present

## 2018-04-20 DIAGNOSIS — D473 Essential (hemorrhagic) thrombocythemia: Secondary | ICD-10-CM | POA: Diagnosis not present

## 2018-04-20 DIAGNOSIS — R7881 Bacteremia: Secondary | ICD-10-CM | POA: Diagnosis not present

## 2018-04-20 DIAGNOSIS — Z885 Allergy status to narcotic agent status: Secondary | ICD-10-CM

## 2018-04-20 DIAGNOSIS — Z87442 Personal history of urinary calculi: Secondary | ICD-10-CM | POA: Diagnosis not present

## 2018-04-20 DIAGNOSIS — R0600 Dyspnea, unspecified: Secondary | ICD-10-CM

## 2018-04-20 DIAGNOSIS — Z681 Body mass index (BMI) 19 or less, adult: Secondary | ICD-10-CM | POA: Diagnosis not present

## 2018-04-20 DIAGNOSIS — Z872 Personal history of diseases of the skin and subcutaneous tissue: Secondary | ICD-10-CM | POA: Diagnosis not present

## 2018-04-20 DIAGNOSIS — Z7902 Long term (current) use of antithrombotics/antiplatelets: Secondary | ICD-10-CM

## 2018-04-20 DIAGNOSIS — L89899 Pressure ulcer of other site, unspecified stage: Secondary | ICD-10-CM

## 2018-04-20 DIAGNOSIS — Z86711 Personal history of pulmonary embolism: Secondary | ICD-10-CM

## 2018-04-20 DIAGNOSIS — J984 Other disorders of lung: Secondary | ICD-10-CM

## 2018-04-20 DIAGNOSIS — F1910 Other psychoactive substance abuse, uncomplicated: Secondary | ICD-10-CM | POA: Diagnosis present

## 2018-04-20 DIAGNOSIS — I509 Heart failure, unspecified: Secondary | ICD-10-CM | POA: Diagnosis present

## 2018-04-20 DIAGNOSIS — B9562 Methicillin resistant Staphylococcus aureus infection as the cause of diseases classified elsewhere: Secondary | ICD-10-CM | POA: Diagnosis not present

## 2018-04-20 DIAGNOSIS — J850 Gangrene and necrosis of lung: Secondary | ICD-10-CM | POA: Diagnosis present

## 2018-04-20 DIAGNOSIS — Z808 Family history of malignant neoplasm of other organs or systems: Secondary | ICD-10-CM

## 2018-04-20 DIAGNOSIS — J189 Pneumonia, unspecified organism: Secondary | ICD-10-CM | POA: Diagnosis not present

## 2018-04-20 DIAGNOSIS — Z800 Family history of malignant neoplasm of digestive organs: Secondary | ICD-10-CM

## 2018-04-20 DIAGNOSIS — Z8249 Family history of ischemic heart disease and other diseases of the circulatory system: Secondary | ICD-10-CM

## 2018-04-20 DIAGNOSIS — D638 Anemia in other chronic diseases classified elsewhere: Secondary | ICD-10-CM | POA: Diagnosis present

## 2018-04-20 DIAGNOSIS — E785 Hyperlipidemia, unspecified: Secondary | ICD-10-CM | POA: Diagnosis present

## 2018-04-20 DIAGNOSIS — Z91013 Allergy to seafood: Secondary | ICD-10-CM

## 2018-04-20 DIAGNOSIS — J440 Chronic obstructive pulmonary disease with acute lower respiratory infection: Secondary | ICD-10-CM | POA: Diagnosis present

## 2018-04-20 DIAGNOSIS — F329 Major depressive disorder, single episode, unspecified: Secondary | ICD-10-CM | POA: Diagnosis present

## 2018-04-20 DIAGNOSIS — R6889 Other general symptoms and signs: Secondary | ICD-10-CM | POA: Diagnosis not present

## 2018-04-20 DIAGNOSIS — Z720 Tobacco use: Secondary | ICD-10-CM | POA: Diagnosis not present

## 2018-04-20 DIAGNOSIS — Z8615 Personal history of latent tuberculosis infection: Secondary | ICD-10-CM

## 2018-04-20 DIAGNOSIS — K08409 Partial loss of teeth, unspecified cause, unspecified class: Secondary | ICD-10-CM | POA: Diagnosis not present

## 2018-04-20 DIAGNOSIS — J150 Pneumonia due to Klebsiella pneumoniae: Secondary | ICD-10-CM | POA: Diagnosis not present

## 2018-04-20 DIAGNOSIS — Z888 Allergy status to other drugs, medicaments and biological substances status: Secondary | ICD-10-CM

## 2018-04-20 DIAGNOSIS — L89322 Pressure ulcer of left buttock, stage 2: Secondary | ICD-10-CM | POA: Diagnosis present

## 2018-04-20 DIAGNOSIS — J449 Chronic obstructive pulmonary disease, unspecified: Secondary | ICD-10-CM | POA: Diagnosis present

## 2018-04-20 DIAGNOSIS — Z79899 Other long term (current) drug therapy: Secondary | ICD-10-CM

## 2018-04-20 DIAGNOSIS — J42 Unspecified chronic bronchitis: Secondary | ICD-10-CM | POA: Diagnosis not present

## 2018-04-20 DIAGNOSIS — K0889 Other specified disorders of teeth and supporting structures: Secondary | ICD-10-CM | POA: Diagnosis not present

## 2018-04-20 DIAGNOSIS — F1410 Cocaine abuse, uncomplicated: Secondary | ICD-10-CM | POA: Diagnosis not present

## 2018-04-20 HISTORY — DX: Pneumonia, unspecified organism: J18.9

## 2018-04-20 HISTORY — DX: Other disorders of lung: J98.4

## 2018-04-20 LAB — CBC WITH DIFFERENTIAL/PLATELET
Abs Immature Granulocytes: 0.03 10*3/uL (ref 0.00–0.07)
Basophils Absolute: 0.1 10*3/uL (ref 0.0–0.1)
Basophils Relative: 1 %
Eosinophils Absolute: 0.5 10*3/uL (ref 0.0–0.5)
Eosinophils Relative: 6 %
HCT: 34.6 % — ABNORMAL LOW (ref 39.0–52.0)
Hemoglobin: 10.2 g/dL — ABNORMAL LOW (ref 13.0–17.0)
Immature Granulocytes: 0 %
Lymphocytes Relative: 30 %
Lymphs Abs: 2.4 10*3/uL (ref 0.7–4.0)
MCH: 22.8 pg — ABNORMAL LOW (ref 26.0–34.0)
MCHC: 29.5 g/dL — ABNORMAL LOW (ref 30.0–36.0)
MCV: 77.4 fL — ABNORMAL LOW (ref 80.0–100.0)
Monocytes Absolute: 0.5 10*3/uL (ref 0.1–1.0)
Monocytes Relative: 6 %
Neutro Abs: 4.6 10*3/uL (ref 1.7–7.7)
Neutrophils Relative %: 57 %
Platelets: 412 10*3/uL — ABNORMAL HIGH (ref 150–400)
RBC: 4.47 MIL/uL (ref 4.22–5.81)
RDW: 19.9 % — ABNORMAL HIGH (ref 11.5–15.5)
WBC: 8 10*3/uL (ref 4.0–10.5)
nRBC: 0 % (ref 0.0–0.2)

## 2018-04-20 LAB — COMPREHENSIVE METABOLIC PANEL
ALT: 15 U/L (ref 0–44)
AST: 22 U/L (ref 15–41)
Albumin: 3.2 g/dL — ABNORMAL LOW (ref 3.5–5.0)
Alkaline Phosphatase: 110 U/L (ref 38–126)
Anion gap: 12 (ref 5–15)
BUN: 14 mg/dL (ref 6–20)
CO2: 25 mmol/L (ref 22–32)
Calcium: 9.3 mg/dL (ref 8.9–10.3)
Chloride: 102 mmol/L (ref 98–111)
Creatinine, Ser: 0.76 mg/dL (ref 0.61–1.24)
GFR calc Af Amer: >60 mL/min (ref >60)
GFR calc non Af Amer: >60 mL/min (ref >60)
Glucose, Bld: 150 mg/dL — ABNORMAL HIGH (ref 70–99)
Potassium: 4 mmol/L (ref 3.5–5.1)
Sodium: 139 mmol/L (ref 135–145)
Total Bilirubin: 0.5 mg/dL (ref 0.3–1.2)
Total Protein: 7.8 g/dL (ref 6.5–8.1)

## 2018-04-20 LAB — BRAIN NATRIURETIC PEPTIDE: B Natriuretic Peptide: 103.8 pg/mL — ABNORMAL HIGH (ref 0.0–100.0)

## 2018-04-20 LAB — LACTIC ACID, PLASMA: Lactic Acid, Venous: 2 mmol/L (ref 0.5–1.9)

## 2018-04-20 LAB — GLUCOSE, CAPILLARY: Glucose-Capillary: 139 mg/dL — ABNORMAL HIGH (ref 70–99)

## 2018-04-20 LAB — TROPONIN I: Troponin I: <0.03 ng/mL (ref <0.03)

## 2018-04-20 MED ORDER — NICOTINE 21 MG/24HR TD PT24
21.0000 mg | MEDICATED_PATCH | Freq: Every day | TRANSDERMAL | Status: DC
  Administered 2018-04-21 – 2018-04-24 (×4): 21 mg via TRANSDERMAL
  Filled 2018-04-20 (×5): qty 1

## 2018-04-20 MED ORDER — ACETAMINOPHEN 650 MG RE SUPP: 650.0000 mg | Freq: Four times a day (QID) | RECTAL | Status: DC | PRN

## 2018-04-20 MED ORDER — FAMOTIDINE 20 MG PO TABS
20.0000 mg | ORAL_TABLET | Freq: Every day | ORAL | Status: DC
  Administered 2018-04-21 – 2018-04-24 (×4): 20 mg via ORAL
  Filled 2018-04-20 (×5): qty 1

## 2018-04-20 MED ORDER — SODIUM CHLORIDE 0.9 % IV SOLN
3.0000 g | Freq: Once | INTRAVENOUS | Status: AC
  Administered 2018-04-20: 3 g via INTRAVENOUS
  Filled 2018-04-20: qty 3

## 2018-04-20 MED ORDER — INSULIN ASPART 100 UNIT/ML ~~LOC~~ SOLN
0.0000 [IU] | Freq: Three times a day (TID) | SUBCUTANEOUS | Status: DC
  Administered 2018-04-22: 1 [IU] via SUBCUTANEOUS
  Administered 2018-04-23 (×3): 2 [IU] via SUBCUTANEOUS
  Administered 2018-04-24: 12:00:00 1 [IU] via SUBCUTANEOUS

## 2018-04-20 MED ORDER — SODIUM CHLORIDE 0.9 % IV SOLN
3.0000 g | Freq: Four times a day (QID) | INTRAVENOUS | Status: DC
  Administered 2018-04-21 – 2018-04-23 (×11): 3 g via INTRAVENOUS
  Filled 2018-04-20 (×14): qty 3

## 2018-04-20 MED ORDER — PANTOPRAZOLE SODIUM 20 MG PO TBEC
20.0000 mg | DELAYED_RELEASE_TABLET | Freq: Every morning | ORAL | Status: DC
  Administered 2018-04-21 – 2018-04-24 (×4): 20 mg via ORAL
  Filled 2018-04-20 (×5): qty 1

## 2018-04-20 MED ORDER — ACETAMINOPHEN 325 MG PO TABS: 650.0000 mg | ORAL_TABLET | Freq: Four times a day (QID) | ORAL | Status: DC | PRN

## 2018-04-20 MED ORDER — SODIUM CHLORIDE 0.9% FLUSH
3.0000 mL | Freq: Two times a day (BID) | INTRAVENOUS | Status: DC
  Administered 2018-04-21 – 2018-04-24 (×6): 3 mL via INTRAVENOUS

## 2018-04-20 MED ORDER — POLYETHYLENE GLYCOL 3350 17 G PO PACK: 17.0000 g | PACK | Freq: Every day | ORAL | Status: DC | PRN

## 2018-04-20 MED ORDER — ENOXAPARIN SODIUM 40 MG/0.4ML ~~LOC~~ SOLN
40.0000 mg | Freq: Every day | SUBCUTANEOUS | Status: DC
  Administered 2018-04-21 – 2018-04-24 (×4): 40 mg via SUBCUTANEOUS
  Filled 2018-04-20 (×5): qty 0.4

## 2018-04-20 MED ORDER — IOHEXOL 350 MG/ML SOLN
75.0000 mL | Freq: Once | INTRAVENOUS | Status: AC | PRN
  Administered 2018-04-20: 18:00:00 100 mL via INTRAVENOUS

## 2018-04-20 MED ORDER — ONDANSETRON HCL 4 MG/2ML IJ SOLN: 4.0000 mg | Freq: Four times a day (QID) | INTRAMUSCULAR | Status: DC | PRN

## 2018-04-20 MED ORDER — HYDROXYZINE HCL 25 MG PO TABS
25.0000 mg | ORAL_TABLET | Freq: Two times a day (BID) | ORAL | Status: DC
  Administered 2018-04-21 – 2018-04-24 (×8): 25 mg via ORAL
  Filled 2018-04-20 (×9): qty 1

## 2018-04-20 MED ORDER — INSULIN ASPART 100 UNIT/ML ~~LOC~~ SOLN
0.0000 [IU] | Freq: Every day | SUBCUTANEOUS | Status: DC
  Administered 2018-04-23: 0 [IU] via SUBCUTANEOUS

## 2018-04-20 MED ORDER — ALBUTEROL SULFATE HFA 108 (90 BASE) MCG/ACT IN AERS
2.0000 | INHALATION_SPRAY | RESPIRATORY_TRACT | Status: DC | PRN
  Filled 2018-04-20: qty 6.7

## 2018-04-20 MED ORDER — SODIUM CHLORIDE 0.9% FLUSH: 3.0000 mL | INTRAVENOUS | Status: DC | PRN

## 2018-04-20 MED ORDER — OXYCODONE-ACETAMINOPHEN 5-325 MG PO TABS
1.0000 | ORAL_TABLET | ORAL | Status: DC | PRN
  Administered 2018-04-21 – 2018-04-24 (×19): 2 via ORAL
  Filled 2018-04-20 (×20): qty 2

## 2018-04-20 MED ORDER — GUAIFENESIN ER 600 MG PO TB12
1200.0000 mg | ORAL_TABLET | Freq: Two times a day (BID) | ORAL | Status: DC | PRN
  Administered 2018-04-23: 1200 mg via ORAL
  Filled 2018-04-20: qty 2

## 2018-04-20 MED ORDER — SODIUM CHLORIDE 0.9 % IV SOLN: 250.0000 mL | INTRAVENOUS | Status: DC | PRN

## 2018-04-20 MED ORDER — VANCOMYCIN HCL 10 G IV SOLR
1250.0000 mg | Freq: Once | INTRAVENOUS | Status: AC
  Administered 2018-04-20: 1250 mg via INTRAVENOUS
  Filled 2018-04-20: qty 1250

## 2018-04-20 MED ORDER — ONDANSETRON HCL 4 MG PO TABS: 4.0000 mg | ORAL_TABLET | Freq: Four times a day (QID) | ORAL | Status: DC | PRN

## 2018-04-20 MED ORDER — OXYCODONE-ACETAMINOPHEN 5-325 MG PO TABS
1.0000 | ORAL_TABLET | Freq: Once | ORAL | Status: AC
  Administered 2018-04-20: 23:00:00 1 via ORAL
  Filled 2018-04-20: qty 1

## 2018-04-20 MED ORDER — PREGABALIN 75 MG PO CAPS
75.0000 mg | ORAL_CAPSULE | Freq: Three times a day (TID) | ORAL | Status: DC
  Administered 2018-04-21 – 2018-04-24 (×12): 75 mg via ORAL
  Filled 2018-04-20 (×13): qty 1

## 2018-04-20 MED ORDER — CLOPIDOGREL BISULFATE 75 MG PO TABS
75.0000 mg | ORAL_TABLET | Freq: Every day | ORAL | Status: DC
  Administered 2018-04-21 – 2018-04-24 (×4): 75 mg via ORAL
  Filled 2018-04-20 (×5): qty 1

## 2018-04-20 MED ORDER — VANCOMYCIN HCL IN DEXTROSE 1-5 GM/200ML-% IV SOLN
1000.0000 mg | Freq: Two times a day (BID) | INTRAVENOUS | Status: DC
  Administered 2018-04-21: 1000 mg via INTRAVENOUS
  Filled 2018-04-20 (×4): qty 200

## 2018-04-20 NOTE — Progress Notes (Signed)
Pharmacy Antibiotic Note  [name] is a 58 y.o. male admitted on 04/20/2018 with pneumonia and bacteremia. Pharmacy has been consulted for vancomycin and unasyn dosing. Pt is afebrile and WBC is WNL. Scr is WNL. Pt recently completed antibiotics. Of note, pt has a vancomycin allergy with a reaction of a rash. Unable to clarify this with the patient but RN and MD to follow-up. Pt has had vancomycin multiple times in the past. Will plan to run each dose slower to hopefully avoid a rash but may need to change therapy if pt is unable to tolerate.   Plan: Vancomycin 1250mg  IV x 1 then 1gm IV Q12H Unasyn 3gm IV Q6H F/u renal fxn, C&S, clinical status and peak/trough at SS     Temp (24hrs), Avg:97.6 F (36.4 C), Min:97.6 F (36.4 C), Max:97.6 F (36.4 C)  Recent Labs  Lab 04/18/18 0049 04/20/18 1642  WBC 7.2 8.0  CREATININE 0.72 0.76    Estimated Creatinine Clearance: 89.3 mL/min (by C-G formula based on SCr of 0.76 mg/dL).    Allergies  Allergen Reactions  . Buprenorphine Hcl-Naloxone Hcl Shortness Of Breath and Other (See Comments)    "Felt like I was going to die," was  jittery, had trouble breathing, felt hot (reaction to Suboxone) PER THE [facility]CCSRS database, the patient received #42 Suboxone films between 03/26/17 and 04/02/17 from [facility](??)  . Ciprofloxacin Anaphylaxis  . Morphine-Naltrexone Other (See Comments)    Seizures   . Shellfish-Derived Products Anaphylaxis, Hives and Swelling  . Benadryl [Diphenhydramine Hcl] Hives, Itching and Rash  . Fish Allergy Hives, Swelling and Rash  . Morphine And Related Other (See Comments)    Seizures  . Benadryl [Diphenhydramine]   . Ciprofloxacin   . Fish Allergy   . Morphine And Related   . Shellfish Allergy   . Suboxone [Buprenorphine Hcl-Naloxone Hcl]   . Vancomycin   . Vicodin [Hydrocodone-Acetaminophen]   . Vancomycin Rash  . Vicodin [Hydrocodone-Acetaminophen] Nausea Only    Antimicrobials this  admission: Vanc 4/8>> Unasyn 4/8>>  Dose adjustments this admission: N/A  Microbiology results: Pending  Thank you for allowing pharmacy to be a part of this patient's care.  Maliq Pilley, Rande Lawman 04/20/2018 8:27 PM
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
## 2018-04-20 NOTE — ED Notes (Signed)
Attempted report x 2 

## 2018-04-20 NOTE — ED Notes (Signed)
Attempted IV access. Pt is a difficult stick. IV team consulted 

## 2018-04-20 NOTE — ED Triage Notes (Addendum)
Pt arrives via [facility] from home with c/o SOB x3 days. Pt admitted 1 month ago for pneumonia and finished abx. Pt unable to pick up prescribed inhaler. Hx COPD. Pt 90% on RA, placed on 2L by [facility] and up to 99%. Pt endorses chills, but denies cough or fever. Pt alert and oriented. Pt seen here 3 days ago for same, pt denies feeling better.

## 2018-04-20 NOTE — ED Notes (Signed)
Attempted report x1. 

## 2018-04-20 NOTE — ED Provider Notes (Signed)
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 tablet Oral Given 04/20/18 2237)     Initial Impression / Assessment and Plan / ED Course  I have reviewed the triage vital signs and the nursing notes.  Pertinent labs & imaging results that were available during my care of the patient were reviewed by me and considered in my medical decision making (see chart for details).        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  Differential diagnosis includes CHF, pulmonary embolus, COPD exacerbation, pneumonia, worsening of necrotizing pneumonia and lung abscess, other viral infection or bronchitis including COVID-19.  Labs show hemoglobin of 10 which is stable and improved from previous.  Troponin is within  normal limits, have low suspicion for ACS.  CT PE study ordered given PE risk factors, as well as desire to reevaluate area of previous necrotizing pneumonia for presence of abscess.  CT PE study shows no evidence of PE, does show cavitation of previous area of pneumonia and other areas of pneumonia.    Discussed with Dr. Tommy Medal of ID.  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  Hospitalist to admit with ID following.    Final Clinical Impressions(s) / ED Diagnoses   Final diagnoses:  Dyspnea, unspecified type  Cavitary pneumonia    ED Discharge Orders    None       Gareth Morgan, MD 04/21/18 1010
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
## 2018-04-21 DIAGNOSIS — R7881 Bacteremia: Secondary | ICD-10-CM

## 2018-04-21 DIAGNOSIS — F17210 Nicotine dependence, cigarettes, uncomplicated: Secondary | ICD-10-CM

## 2018-04-21 DIAGNOSIS — Z885 Allergy status to narcotic agent status: Secondary | ICD-10-CM

## 2018-04-21 DIAGNOSIS — J42 Unspecified chronic bronchitis: Secondary | ICD-10-CM

## 2018-04-21 DIAGNOSIS — L8990 Pressure ulcer of unspecified site, unspecified stage: Secondary | ICD-10-CM

## 2018-04-21 DIAGNOSIS — Z881 Allergy status to other antibiotic agents status: Secondary | ICD-10-CM

## 2018-04-21 DIAGNOSIS — Z872 Personal history of diseases of the skin and subcutaneous tissue: Secondary | ICD-10-CM

## 2018-04-21 DIAGNOSIS — Z888 Allergy status to other drugs, medicaments and biological substances status: Secondary | ICD-10-CM

## 2018-04-21 DIAGNOSIS — J850 Gangrene and necrosis of lung: Secondary | ICD-10-CM

## 2018-04-21 DIAGNOSIS — B9562 Methicillin resistant Staphylococcus aureus infection as the cause of diseases classified elsewhere: Secondary | ICD-10-CM

## 2018-04-21 DIAGNOSIS — L89899 Pressure ulcer of other site, unspecified stage: Secondary | ICD-10-CM

## 2018-04-21 DIAGNOSIS — F1990 Other psychoactive substance use, unspecified, uncomplicated: Secondary | ICD-10-CM

## 2018-04-21 DIAGNOSIS — F329 Major depressive disorder, single episode, unspecified: Secondary | ICD-10-CM

## 2018-04-21 DIAGNOSIS — K08409 Partial loss of teeth, unspecified cause, unspecified class: Secondary | ICD-10-CM

## 2018-04-21 DIAGNOSIS — Z91013 Allergy to seafood: Secondary | ICD-10-CM

## 2018-04-21 DIAGNOSIS — K029 Dental caries, unspecified: Secondary | ICD-10-CM

## 2018-04-21 DIAGNOSIS — F1910 Other psychoactive substance abuse, uncomplicated: Secondary | ICD-10-CM

## 2018-04-21 DIAGNOSIS — Z8615 Personal history of latent tuberculosis infection: Secondary | ICD-10-CM

## 2018-04-21 LAB — CBC WITH DIFFERENTIAL/PLATELET
Abs Immature Granulocytes: 0.02 10*3/uL (ref 0.00–0.07)
Abs Immature Granulocytes: 0.03 10*3/uL (ref 0.00–0.07)
Basophils Absolute: 0 10*3/uL (ref 0.0–0.1)
Basophils Absolute: 0.1 10*3/uL (ref 0.0–0.1)
Basophils Relative: 1 %
Basophils Relative: 1 %
Eosinophils Absolute: 0.5 10*3/uL (ref 0.0–0.5)
Eosinophils Absolute: 0.5 10*3/uL (ref 0.0–0.5)
Eosinophils Relative: 6 %
Eosinophils Relative: 8 %
HCT: 32.3 % — ABNORMAL LOW (ref 39.0–52.0)
HCT: 34 % — ABNORMAL LOW (ref 39.0–52.0)
Hemoglobin: 9.2 g/dL — ABNORMAL LOW (ref 13.0–17.0)
Hemoglobin: 9.7 g/dL — ABNORMAL LOW (ref 13.0–17.0)
Immature Granulocytes: 0 %
Immature Granulocytes: 0 %
Lymphocytes Relative: 31 %
Lymphocytes Relative: 33 %
Lymphs Abs: 2.1 10*3/uL (ref 0.7–4.0)
Lymphs Abs: 2.5 10*3/uL (ref 0.7–4.0)
MCH: 21.6 pg — ABNORMAL LOW (ref 26.0–34.0)
MCH: 21.8 pg — ABNORMAL LOW (ref 26.0–34.0)
MCHC: 28.5 g/dL — ABNORMAL LOW (ref 30.0–36.0)
MCHC: 28.5 g/dL — ABNORMAL LOW (ref 30.0–36.0)
MCV: 75.8 fL — ABNORMAL LOW (ref 80.0–100.0)
MCV: 76.4 fL — ABNORMAL LOW (ref 80.0–100.0)
Monocytes Absolute: 0.4 10*3/uL (ref 0.1–1.0)
Monocytes Absolute: 0.5 10*3/uL (ref 0.1–1.0)
Monocytes Relative: 6 %
Monocytes Relative: 6 %
Neutro Abs: 3.6 10*3/uL (ref 1.7–7.7)
Neutro Abs: 4 10*3/uL (ref 1.7–7.7)
Neutrophils Relative %: 54 %
Neutrophils Relative %: 54 %
Platelets: 357 10*3/uL (ref 150–400)
Platelets: 388 10*3/uL (ref 150–400)
RBC: 4.26 MIL/uL (ref 4.22–5.81)
RBC: 4.45 MIL/uL (ref 4.22–5.81)
RDW: 19.9 % — ABNORMAL HIGH (ref 11.5–15.5)
RDW: 19.9 % — ABNORMAL HIGH (ref 11.5–15.5)
WBC: 6.7 10*3/uL (ref 4.0–10.5)
WBC: 7.4 10*3/uL (ref 4.0–10.5)
nRBC: 0 % (ref 0.0–0.2)
nRBC: 0 % (ref 0.0–0.2)

## 2018-04-21 LAB — COMPREHENSIVE METABOLIC PANEL
ALT: 14 U/L (ref 0–44)
AST: 17 U/L (ref 15–41)
Albumin: 2.6 g/dL — ABNORMAL LOW (ref 3.5–5.0)
Alkaline Phosphatase: 96 U/L (ref 38–126)
Anion gap: 10 (ref 5–15)
BUN: 9 mg/dL (ref 6–20)
CO2: 26 mmol/L (ref 22–32)
Calcium: 8.6 mg/dL — ABNORMAL LOW (ref 8.9–10.3)
Chloride: 103 mmol/L (ref 98–111)
Creatinine, Ser: 0.79 mg/dL (ref 0.61–1.24)
GFR calc Af Amer: >60 mL/min (ref >60)
GFR calc non Af Amer: >60 mL/min (ref >60)
Glucose, Bld: 189 mg/dL — ABNORMAL HIGH (ref 70–99)
Potassium: 3.9 mmol/L (ref 3.5–5.1)
Sodium: 139 mmol/L (ref 135–145)
Total Bilirubin: 0.3 mg/dL (ref 0.3–1.2)
Total Protein: 6.8 g/dL (ref 6.5–8.1)

## 2018-04-21 LAB — BASIC METABOLIC PANEL
Anion gap: 8 (ref 5–15)
BUN: 11 mg/dL (ref 6–20)
CO2: 28 mmol/L (ref 22–32)
Calcium: 8.8 mg/dL — ABNORMAL LOW (ref 8.9–10.3)
Chloride: 101 mmol/L (ref 98–111)
Creatinine, Ser: 0.79 mg/dL (ref 0.61–1.24)
GFR calc Af Amer: >60 mL/min (ref >60)
GFR calc non Af Amer: >60 mL/min (ref >60)
Glucose, Bld: 125 mg/dL — ABNORMAL HIGH (ref 70–99)
Potassium: 3.8 mmol/L (ref 3.5–5.1)
Sodium: 137 mmol/L (ref 135–145)

## 2018-04-21 LAB — GLUCOSE, CAPILLARY
Glucose-Capillary: 109 mg/dL — ABNORMAL HIGH (ref 70–99)
Glucose-Capillary: 81 mg/dL (ref 70–99)
Glucose-Capillary: 120 mg/dL — ABNORMAL HIGH (ref 70–99)
Glucose-Capillary: 138 mg/dL — ABNORMAL HIGH (ref 70–99)

## 2018-04-21 LAB — RAPID URINE DRUG SCREEN, HOSP PERFORMED
Amphetamines: NOT DETECTED
Barbiturates: NOT DETECTED
Benzodiazepines: NOT DETECTED
Cocaine: POSITIVE — AB
Opiates: POSITIVE — AB
Tetrahydrocannabinol: NOT DETECTED

## 2018-04-21 LAB — PHOSPHORUS: Phosphorus: 3.8 mg/dL (ref 2.5–4.6)

## 2018-04-21 LAB — STREP PNEUMONIAE URINARY ANTIGEN: Strep Pneumo Urinary Antigen: NEGATIVE

## 2018-04-21 LAB — MAGNESIUM: Magnesium: 1.8 mg/dL (ref 1.7–2.4)

## 2018-04-21 LAB — LACTIC ACID, PLASMA: Lactic Acid, Venous: 1.4 mmol/L (ref 0.5–1.9)

## 2018-04-21 MED ORDER — ENSURE ENLIVE PO LIQD
237.0000 mL | Freq: Two times a day (BID) | ORAL | Status: DC
  Administered 2018-04-21 – 2018-04-24 (×7): 237 mL via ORAL

## 2018-04-21 MED ORDER — LINEZOLID 600 MG PO TABS
600.0000 mg | ORAL_TABLET | Freq: Two times a day (BID) | ORAL | Status: DC
  Administered 2018-04-21 – 2018-04-23 (×4): 600 mg via ORAL
  Filled 2018-04-21 (×6): qty 1
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
## 2018-04-21 NOTE — Progress Notes (Signed)
Initial Nutrition Assessment   RD working remotely.  DOCUMENTATION CODES:   Underweight  INTERVENTION:  Provide Ensure Enlive po BID, each supplement provides 350 kcal and 20 grams of protein.  Encourage adequate PO intake.   NUTRITION DIAGNOSIS:   Increased nutrient needs related to chronic illness(COPD, CHF) as evidenced by estimated needs.  GOAL:   Patient will meet greater than or equal to 90% of their needs  MONITOR:   PO intake, Supplement acceptance, Labs, Weight trends, I & O's, Skin  REASON FOR ASSESSMENT:   (Low BMI)    ASSESSMENT:   58 y.o. male with medical history significant for IV drug abuse, history of CVA, CHF, peripheral arterial disease status post right AKA, type 2 diabetes mellitus, COPD, and admission last month for necrotizing pneumonia, now presenting for evaluation of worsening shortness of breath.  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 COVID19 results pending.   RD unable to obtain nutrition history from attempt at contacting pt via inpatient room phone. Pt with history of CHF. Per weight records, pt with weight fluctuations likely related to to fluid status. RD to order nutritional supplements to aid in caloric and protein need as well as in healing.   Unable to complete Nutrition-Focused physical exam at this time.   Labs and medications reviewed.   Diet Order:   Diet Order            Diet heart healthy/carb modified Room service appropriate? Yes; Fluid consistency: Thin  Diet effective now              EDUCATION NEEDS:   Not appropriate for education at this time  Skin:  Skin Assessment: Skin Integrity Issues: Skin Integrity Issues:: Stage II Stage II: L buttocks  Last BM:  Unknown  Height:   Ht Readings from Last 1 Encounters:  04/20/18 6\' 1"  (1.854 m)     Weight:   Wt Readings from Last 1 Encounters:  04/20/18 57.9 kg    Ideal Body Weight:  76.9 kg(adjusted for R AKA)  BMI:  Body mass index is 16.84 kg/m.  Estimated Nutritional Needs:   Kcal:  2000-2200  Protein:  90-105 grams  Fluid:  2 - 2.2 L/day    Corrin Parker, MS, RD, LDN Pager # [phone] After hours/ weekend pager # [phone]
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
## 2018-04-22 ENCOUNTER — Inpatient Hospital Stay (HOSPITAL_COMMUNITY): Payer: Medicaid Other

## 2018-04-22 DIAGNOSIS — R636 Underweight: Secondary | ICD-10-CM

## 2018-04-22 DIAGNOSIS — K0889 Other specified disorders of teeth and supporting structures: Secondary | ICD-10-CM

## 2018-04-22 DIAGNOSIS — F1410 Cocaine abuse, uncomplicated: Secondary | ICD-10-CM

## 2018-04-22 DIAGNOSIS — D473 Essential (hemorrhagic) thrombocythemia: Secondary | ICD-10-CM

## 2018-04-22 DIAGNOSIS — Z720 Tobacco use: Secondary | ICD-10-CM

## 2018-04-22 LAB — CBC WITH DIFFERENTIAL/PLATELET
Abs Immature Granulocytes: 0.03 10*3/uL (ref 0.00–0.07)
Basophils Absolute: 0.1 10*3/uL (ref 0.0–0.1)
Basophils Relative: 1 %
Eosinophils Absolute: 0.6 10*3/uL — ABNORMAL HIGH (ref 0.0–0.5)
Eosinophils Relative: 7 %
HCT: 36.6 % — ABNORMAL LOW (ref 39.0–52.0)
Hemoglobin: 10.9 g/dL — ABNORMAL LOW (ref 13.0–17.0)
Immature Granulocytes: 0 %
Lymphocytes Relative: 36 %
Lymphs Abs: 3.1 10*3/uL (ref 0.7–4.0)
MCH: 22.6 pg — ABNORMAL LOW (ref 26.0–34.0)
MCHC: 29.8 g/dL — ABNORMAL LOW (ref 30.0–36.0)
MCV: 75.8 fL — ABNORMAL LOW (ref 80.0–100.0)
Monocytes Absolute: 0.6 10*3/uL (ref 0.1–1.0)
Monocytes Relative: 7 %
Neutro Abs: 4.3 10*3/uL (ref 1.7–7.7)
Neutrophils Relative %: 49 %
Platelets: 404 10*3/uL — ABNORMAL HIGH (ref 150–400)
RBC: 4.83 MIL/uL (ref 4.22–5.81)
RDW: 19.6 % — ABNORMAL HIGH (ref 11.5–15.5)
WBC: 8.6 10*3/uL (ref 4.0–10.5)
nRBC: 0 % (ref 0.0–0.2)

## 2018-04-22 LAB — COMPREHENSIVE METABOLIC PANEL
ALT: 12 U/L (ref 0–44)
AST: 14 U/L — ABNORMAL LOW (ref 15–41)
Albumin: 2.8 g/dL — ABNORMAL LOW (ref 3.5–5.0)
Alkaline Phosphatase: 94 U/L (ref 38–126)
Anion gap: 11 (ref 5–15)
BUN: 13 mg/dL (ref 6–20)
CO2: 28 mmol/L (ref 22–32)
Calcium: 8.9 mg/dL (ref 8.9–10.3)
Chloride: 101 mmol/L (ref 98–111)
Creatinine, Ser: 0.77 mg/dL (ref 0.61–1.24)
GFR calc Af Amer: >60 mL/min (ref >60)
GFR calc non Af Amer: >60 mL/min (ref >60)
Glucose, Bld: 116 mg/dL — ABNORMAL HIGH (ref 70–99)
Potassium: 4.2 mmol/L (ref 3.5–5.1)
Sodium: 140 mmol/L (ref 135–145)
Total Bilirubin: 0.4 mg/dL (ref 0.3–1.2)
Total Protein: 7 g/dL (ref 6.5–8.1)

## 2018-04-22 LAB — RETICULOCYTES
Immature Retic Fract: 20.5 % — ABNORMAL HIGH (ref 2.3–15.9)
RBC.: 4.97 MIL/uL (ref 4.22–5.81)
Retic Count, Absolute: 101.4 10*3/uL (ref 19.0–186.0)
Retic Ct Pct: 2 % (ref 0.4–3.1)

## 2018-04-22 LAB — ACID FAST SMEAR (AFB, MYCOBACTERIA)
Acid Fast Smear: NEGATIVE
Acid Fast Smear: NEGATIVE
Acid Fast Smear: NEGATIVE

## 2018-04-22 LAB — GLUCOSE, CAPILLARY
Glucose-Capillary: 113 mg/dL — ABNORMAL HIGH (ref 70–99)
Glucose-Capillary: 116 mg/dL — ABNORMAL HIGH (ref 70–99)
Glucose-Capillary: 142 mg/dL — ABNORMAL HIGH (ref 70–99)
Glucose-Capillary: 177 mg/dL — ABNORMAL HIGH (ref 70–99)

## 2018-04-22 LAB — FOLATE: Folate: 5.8 ng/mL — ABNORMAL LOW (ref >5.9)

## 2018-04-22 LAB — MAGNESIUM: Magnesium: 2 mg/dL (ref 1.7–2.4)

## 2018-04-22 LAB — IRON AND TIBC
Iron: 28 ug/dL — ABNORMAL LOW (ref 45–182)
Saturation Ratios: 9 % — ABNORMAL LOW (ref 17.9–39.5)
TIBC: 322 ug/dL (ref 250–450)
UIBC: 294 ug/dL

## 2018-04-22 LAB — FERRITIN: Ferritin: 45 ng/mL (ref 24–336)

## 2018-04-22 LAB — PHOSPHORUS: Phosphorus: 3.4 mg/dL (ref 2.5–4.6)

## 2018-04-22 LAB — VITAMIN B12: Vitamin B-12: 439 pg/mL (ref 180–914)

## 2018-04-22 NOTE — Progress Notes (Signed)
Subjective:  He says his family is worried about him know that we were testing him for coronavirus   Antibiotics:  Anti-infectives (From admission, onward)   Start     Dose/Rate Route Frequency Ordered Stop   04/21/18 2200  linezolid (ZYVOX) tablet 600 mg     600 mg Oral Every 12 hours 04/21/18 1418     04/21/18 1000  vancomycin (VANCOCIN) IVPB 1000 mg/200 mL premix  Status:  Discontinued     1,000 mg 100 mL/hr over 120 Minutes Intravenous Every 12 hours 04/20/18 2057 04/21/18 1418   04/21/18 0300  Ampicillin-Sulbactam (UNASYN) 3 g in sodium chloride 0.9 % 100 mL IVPB     3 g 200 mL/hr over 30 Minutes Intravenous Every 6 hours 04/20/18 2057     04/20/18 2030  vancomycin (VANCOCIN) 1,250 mg in sodium chloride 0.9 % 250 mL IVPB     1,250 mg 125 mL/hr over 120 Minutes Intravenous  Once 04/20/18 2023 04/21/18 0039   04/20/18 2015  Ampicillin-Sulbactam (UNASYN) 3 g in sodium chloride 0.9 % 100 mL IVPB     3 g 200 mL/hr over 30 Minutes Intravenous  Once 04/20/18 2008 04/20/18 2236      Medications: Scheduled Meds: . clopidogrel  75 mg Oral Daily  . enoxaparin (LOVENOX) injection  40 mg Subcutaneous Daily  . famotidine  20 mg Oral Daily  . feeding supplement (ENSURE ENLIVE)  237 mL Oral BID BM  . hydrOXYzine  25 mg Oral BID  . insulin aspart  0-5 Units Subcutaneous QHS  . insulin aspart  0-9 Units Subcutaneous TID WC  . linezolid  600 mg Oral Q12H  . nicotine  21 mg Transdermal Daily  . pantoprazole  20 mg Oral q morning - 10a  . pregabalin  75 mg Oral TID  . sodium chloride flush  3 mL Intravenous Q12H   Continuous Infusions: . sodium chloride    . ampicillin-sulbactam (UNASYN) IV 3 g (04/22/18 0842)   PRN Meds:.sodium chloride, acetaminophen **OR** acetaminophen, albuterol, guaiFENesin, ondansetron **OR** ondansetron (ZOFRAN) IV, oxyCODONE-acetaminophen, polyethylene glycol, sodium chloride flush    Objective: Weight change:   Intake/Output Summary (Last 24  hours) at 04/22/2018 1058 Last data filed at 04/22/2018 0843 Gross per 24 hour  Intake 723 ml  Output 2400 ml  Net -1677 ml   Blood pressure (!) 161/85, pulse 66, temperature 97.7 F (36.5 C), temperature source Oral, resp. rate 18, height 6\' 1"  (1.854 m), weight 57.9 kg, SpO2 95 %. Temp:  [97.7 F (36.5 C)-97.9 F (36.6 C)] 97.7 F (36.5 C) (04/10 0830) Pulse Rate:  [66-69] 66 (04/10 0830) BP: (160-161)/(79-85) 161/85 (04/10 0830) SpO2:  [95 %-99 %] 95 % (04/10 0830)  Physical Exam: General: Alert and awake, oriented x3, underweight HEENT: anicteric sclera, EOMI CVS regular rate, normal  Chest: , no wheezing, no respiratory distress Abdomen: soft non-distended,  Extremities: no edema or deformity noted bilaterally Skin: Left buttocks has area that looks like it was an abscess that burst and is now healing Neuro: nonfocal  CBC:    BMET Recent Labs    04/21/18 0953 04/22/18 0334  NA 139 140  K 3.9 4.2  CL 103 101  CO2 26 28  GLUCOSE 189* 116*  BUN 9 13  CREATININE 0.79 0.77  CALCIUM 8.6* 8.9     Liver Panel  Recent Labs    04/21/18 0953 04/22/18 0334  PROT 6.8 7.0  ALBUMIN 2.6* 2.8*  AST  17 14*  ALT 14 12  ALKPHOS 96 94  BILITOT 0.3 0.4       Sedimentation Rate No results for input(s): ESRSEDRATE in the last 72 hours. C-Reactive Protein No results for input(s): CRP in the last 72 hours.  Micro Results: Recent Results (from the past 720 hour(s))  Blood culture (routine x 2)     Status: None (Preliminary result)   Collection Time: 04/20/18  8:56 PM  Result Value Ref Range Status   Specimen Description BLOOD RIGHT ARM  Final   Special Requests   Final    BOTTLES DRAWN AEROBIC AND ANAEROBIC Blood Culture adequate volume   Culture   Final    NO GROWTH < 24 HOURS Performed at [facility], [address] [place], [place] [address]    Report Status PENDING  Incomplete  Blood culture (routine x 2)     Status: None (Preliminary result)    Collection Time: 04/20/18  8:58 PM  Result Value Ref Range Status   Specimen Description BLOOD LEFT ANTECUBITAL  Final   Special Requests   Final    BOTTLES DRAWN AEROBIC AND ANAEROBIC Blood Culture adequate volume   Culture   Final    NO GROWTH < 24 HOURS Performed at [facility], [place] [address], [place], [place] [address]    Report Status PENDING  Incomplete  Culture, respiratory     Status: None (Preliminary result)   Collection Time: 04/20/18  9:29 PM  Result Value Ref Range Status   Specimen Description SPUTUM  Final   Special Requests NONE  Final   Gram Stain   Final    MODERATE WBC PRESENT,BOTH PMN AND MONONUCLEAR ABUNDANT GRAM NEGATIVE RODS RARE GRAM POSITIVE COCCI FEW YEAST MODERATE SQUAMOUS EPITHELIAL CELLS PRESENT    Culture   Final    MODERATE GRAM NEGATIVE RODS IDENTIFICATION AND SUSCEPTIBILITIES TO FOLLOW Performed at [facility], [place] [address], [place], [place] [address]    Report Status PENDING  Incomplete    Studies/Results: Ct Angio Chest Pe W And/or Wo Contrast  Result Date: 04/20/2018 CLINICAL DATA:  Shortness of breath. Recent left lower lobe pneumonia EXAM: CT ANGIOGRAPHY CHEST WITH CONTRAST TECHNIQUE: Multidetector CT imaging of the chest was performed using the standard protocol during bolus administration of intravenous contrast. Multiplanar CT image reconstructions and MIPs were obtained to evaluate the vascular anatomy. CONTRAST:  129mL OMNIPAQUE IOHEXOL 350 MG/ML SOLN COMPARISON:  Chest CT March 16, 2018 and chest radiograph April 18, 2018 FINDINGS: Cardiovascular: There is no demonstrable pulmonary embolus. There is no appreciable thoracic aortic aneurysm or dissection. There are scattered foci of calcification in visualized great vessels. There is aortic atherosclerosis. There are foci of coronary artery calcification. There is no pericardial effusion or pericardial thickening evident. Mediastinum/Nodes: Visualized thyroid appears normal.  There are subcentimeter mediastinal lymph nodes. There is a left hilar lymph node measuring 1.3 x 1.3 cm. No esophageal lesions are evident. Lungs/Pleura: There is airspace consolidation with cavitation in the left lower lobe, involving portions of the superior, medial, and posterior segments. Inflammatory change from this cavitary pneumonia extends to the inferior left hilar region. Elsewhere, there are areas of atelectatic change in the inferior lingula and right middle lobe. Subtle areas of patchy infiltrate are noted in the inferior lingula as well. There is an area of consolidation in the lateral segment of the right lower lobe, not present previously and felt to represent a new focus of pneumonia since most  recent CT. There is lower lobe bronchiectatic change bilaterally. No appreciable pleural effusion or pleural thickening evident. Upper Abdomen: Spleen appears prominent although incompletely visualized. There is upper abdominal aortic atherosclerosis. There is also visualized major mesenteric arterial vessel atherosclerotic change. Mild reflux of contrast into the inferior vena cava and hepatic veins is noted. Visualized upper abdominal structures otherwise appear unremarkable. Musculoskeletal: No blastic or lytic bone lesions. No evident chest wall lesions. Review of the MIP images confirms the above findings. IMPRESSION: 1. No demonstrable pulmonary embolus. No thoracic aortic aneurysm or dissection. There is aortic atherosclerosis. There are foci coronary artery calcification. 2. Cavitary pneumonia left lower lobe involving multiple segments. The overall extent of pneumonia in the left lower lobe is slightly less than on the previous CT, although the cavitation is a new finding. Well-defined abscess not seen in this area. 3. Scattered foci of pneumonia in the inferior lingula and right lower lobe laterally. Patchy atelectasis noted in the right middle lobe and inferior lingula. 4. Prominent left hilar  lymph node, likely secondary to cavitary pneumonia in the left lower lobe. 5.  Spleen is felt to be prominent although incompletely visualized. 6. Mild reflux of contrast into the inferior vena cava and hepatic veins is noted, a finding that may indicate a degree of increased right heart pressure. Aortic Atherosclerosis (ICD10-I70.0). Electronically Signed   By: Lowella Grip III M.D.   On: 04/20/2018 18:52   Dg Chest Port 1 View  Result Date: 04/22/2018 CLINICAL DATA:  Shortness of Breath EXAM: PORTABLE CHEST 1 VIEW COMPARISON:  CT 04/20/2018 FINDINGS: There is hyperinflation of the lungs compatible with COPD. Airspace opacity in the left lower lobe corresponding to the pneumonia seen on prior chest CT. No confluent opacity on the right. No effusions or acute bony abnormality. Old healed left rib fractures. IMPRESSION: Left lower lobe pneumonia as seen on recent CT. Electronically Signed   By: Rolm Baptise M.D.   On: 04/22/2018 08:07      Assessment/Plan:  INTERVAL HISTORY: It appears that 3 AFB smears were collected if I understand the electronic medical record and it is accurate   Principal Problem:   Cavitary pneumonia Active Problems:   COPD (chronic obstructive pulmonary disease) (HCC)   Chronic pain in left foot   Polysubstance abuse ([place])   History of completed stroke   Microcytic anemia   Diabetes mellitus type 2 in nonobese ([place])   Pressure injury of skin    [name] is a 58 y.o. male with history of active IV drug use tobacco use poor dentition seen by her team in March with necrotizing pneumonia now admitted due to worsening cavitation of his pneumonia.  He did have a blood culture that was positive for methicillin-resistant coagulase-negative staphylococcal species, 1 out of 2 of sequential cultures with possible intervening antibiotics during that admission.  He does not appear to have had an echocardiogram during that time either.  He has now been admitted to  low mat for tuberculosis and to further work-up his cavitary pneumonia.  He was tested for coronavirus 2019 though I think this is highly unlikely.  1.  Necrotizing pneumonia:  Hopefully the 3 AFB smears will be looked at and will be negative  I would obtain a transthoracic echocardiogram when possible.  Blood cultures so far are negative  Continue Zyvox and Unasyn for now  We will follow-up on the cultures from his sputum though may be difficult to interpret.  2.  Rule out TB:  AFB smears been collected  3.  Novel coronavirus 2019 rule out hopefully this test will come back soon and if so I will can take him out of contact isolation     LOS: 2 days   Alcide Evener 04/22/2018, 10:58 AM

## 2018-04-22 NOTE — Progress Notes (Signed)
Patient is on room air with  O2 SAT 94%. Pain management maintained (see eMAR). Continues on Unasyn therapy with no adverse reactions. Call bell within reach. Encouraged to report s/s to RN.
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
## 2018-04-23 DIAGNOSIS — J431 Panlobular emphysema: Secondary | ICD-10-CM

## 2018-04-23 DIAGNOSIS — R6889 Other general symptoms and signs: Secondary | ICD-10-CM

## 2018-04-23 LAB — CBC WITH DIFFERENTIAL/PLATELET
Abs Immature Granulocytes: 0.05 10*3/uL (ref 0.00–0.07)
Basophils Absolute: 0 10*3/uL (ref 0.0–0.1)
Basophils Relative: 1 %
Eosinophils Absolute: 0.8 10*3/uL — ABNORMAL HIGH (ref 0.0–0.5)
Eosinophils Relative: 9 %
HCT: 35.7 % — ABNORMAL LOW (ref 39.0–52.0)
Hemoglobin: 10.6 g/dL — ABNORMAL LOW (ref 13.0–17.0)
Immature Granulocytes: 1 %
Lymphocytes Relative: 27 %
Lymphs Abs: 2.2 10*3/uL (ref 0.7–4.0)
MCH: 22.3 pg — ABNORMAL LOW (ref 26.0–34.0)
MCHC: 29.7 g/dL — ABNORMAL LOW (ref 30.0–36.0)
MCV: 75 fL — ABNORMAL LOW (ref 80.0–100.0)
Monocytes Absolute: 0.5 10*3/uL (ref 0.1–1.0)
Monocytes Relative: 6 %
Neutro Abs: 4.5 10*3/uL (ref 1.7–7.7)
Neutrophils Relative %: 56 %
Platelets: 421 10*3/uL — ABNORMAL HIGH (ref 150–400)
RBC: 4.76 MIL/uL (ref 4.22–5.81)
RDW: 19.4 % — ABNORMAL HIGH (ref 11.5–15.5)
WBC: 8 10*3/uL (ref 4.0–10.5)
nRBC: 0 % (ref 0.0–0.2)

## 2018-04-23 LAB — COMPREHENSIVE METABOLIC PANEL
ALT: 19 U/L (ref 0–44)
AST: 23 U/L (ref 15–41)
Albumin: 2.9 g/dL — ABNORMAL LOW (ref 3.5–5.0)
Alkaline Phosphatase: 88 U/L (ref 38–126)
Anion gap: 12 (ref 5–15)
BUN: 15 mg/dL (ref 6–20)
CO2: 25 mmol/L (ref 22–32)
Calcium: 9 mg/dL (ref 8.9–10.3)
Chloride: 102 mmol/L (ref 98–111)
Creatinine, Ser: 0.79 mg/dL (ref 0.61–1.24)
GFR calc Af Amer: >60 mL/min (ref >60)
GFR calc non Af Amer: >60 mL/min (ref >60)
Glucose, Bld: 133 mg/dL — ABNORMAL HIGH (ref 70–99)
Potassium: 4.1 mmol/L (ref 3.5–5.1)
Sodium: 139 mmol/L (ref 135–145)
Total Bilirubin: 0.3 mg/dL (ref 0.3–1.2)
Total Protein: 7.2 g/dL (ref 6.5–8.1)

## 2018-04-23 LAB — RETICULOCYTES
Immature Retic Fract: 17.9 % — ABNORMAL HIGH (ref 2.3–15.9)
RBC.: 4.76 MIL/uL (ref 4.22–5.81)
Retic Count, Absolute: 106.6 10*3/uL (ref 19.0–186.0)
Retic Ct Pct: 2.2 % (ref 0.4–3.1)

## 2018-04-23 LAB — CULTURE, RESPIRATORY

## 2018-04-23 LAB — GLUCOSE, CAPILLARY
Glucose-Capillary: 151 mg/dL — ABNORMAL HIGH (ref 70–99)
Glucose-Capillary: 180 mg/dL — ABNORMAL HIGH (ref 70–99)
Glucose-Capillary: 155 mg/dL — ABNORMAL HIGH (ref 70–99)
Glucose-Capillary: 110 mg/dL — ABNORMAL HIGH (ref 70–99)

## 2018-04-23 LAB — CULTURE, RESPIRATORY W GRAM STAIN

## 2018-04-23 LAB — VITAMIN B12: Vitamin B-12: 463 pg/mL (ref 180–914)

## 2018-04-23 LAB — PHOSPHORUS: Phosphorus: 3 mg/dL (ref 2.5–4.6)

## 2018-04-23 LAB — FOLATE: Folate: 8.1 ng/mL (ref >5.9)

## 2018-04-23 LAB — IRON AND TIBC
Iron: 28 ug/dL — ABNORMAL LOW (ref 45–182)
Saturation Ratios: 8 % — ABNORMAL LOW (ref 17.9–39.5)
TIBC: 333 ug/dL (ref 250–450)
UIBC: 305 ug/dL

## 2018-04-23 LAB — MAGNESIUM: Magnesium: 2 mg/dL (ref 1.7–2.4)

## 2018-04-23 LAB — FERRITIN: Ferritin: 50 ng/mL (ref 24–336)

## 2018-04-23 MED ORDER — METRONIDAZOLE 500 MG PO TABS
500.0000 mg | ORAL_TABLET | Freq: Three times a day (TID) | ORAL | Status: DC
  Administered 2018-04-23 – 2018-04-24 (×3): 500 mg via ORAL
  Filled 2018-04-23 (×3): qty 1

## 2018-04-23 MED ORDER — SODIUM CHLORIDE 0.9 % IV SOLN
2.0000 g | INTRAVENOUS | Status: DC
  Administered 2018-04-23: 2 g via INTRAVENOUS
  Filled 2018-04-23 (×2): qty 20

## 2018-04-23 NOTE — Progress Notes (Signed)
Pharmacy Antibiotic Note  [name] is a 58 y.o. male admitted on 04/20/2018 with pneumonia and bacteremia. Pharmacy has been consulted for vancomycin and unasyn dosing. Pt is afebrile and WBC is WNL. Scr is WNL. Pt recently completed antibiotics. Of note, pt has a vancomycin allergy with a reaction of a rash.  4/11 Update: Continues on Unasyn for necrotizing pneumonia, renal function stable. Sputum culture today resulted with E coli (R Unasyn) and K pneumoniae (S Unasyn). WBC 8.0, afebrile.  Plan: Linezolid PO 600mg  q12h Unasyn 3g q6hrs Consider optimizing antibiotics to cefazolin given will cover both isolated organisms --Could consider adding flagyl if concerned for anaerobes F/u renal fxn, C&S, clinical status  Height: 6\' 1"  (185.4 cm) Weight: 127 lb 10.3 oz (57.9 kg) IBW/kg (Calculated) : 79.9  Temp (24hrs), Avg:97.6 F (36.4 C), Min:97.6 F (36.4 C), Max:97.6 F (36.4 C)  Recent Labs  Lab 04/20/18 1642 04/20/18 2058 04/20/18 2350 04/21/18 0953 04/22/18 0334 04/23/18 0630  WBC 8.0  --  7.4 6.7 8.6 8.0  CREATININE 0.76  --  0.79 0.79 0.77 0.79  LATICACIDVEN  --  2.0* 1.4  --   --   --     Estimated Creatinine Clearance: 81.4 mL/min (by C-G formula based on SCr of 0.79 mg/dL).    Allergies  Allergen Reactions  . Buprenorphine Hcl-Naloxone Hcl Shortness Of Breath and Other (See Comments)    "Felt like I was going to die," was  jittery, had trouble breathing, felt hot (reaction to Suboxone) PER THE NCCSRS database, the patient received #42 Suboxone films between 03/26/17 and 04/02/17 from [facility](??)  . Ciprofloxacin Anaphylaxis  . Morphine-Naltrexone Other (See Comments)    Seizures   . Shellfish-Derived Products Anaphylaxis, Hives and Swelling  . Benadryl [Diphenhydramine Hcl] Hives, Itching and Rash  . Fish Allergy Hives, Swelling and Rash  . Morphine And Related Other (See Comments)    Seizures  . Benadryl [Diphenhydramine]   .  Ciprofloxacin   . Fish Allergy   . Morphine And Related   . Shellfish Allergy   . Suboxone [Buprenorphine Hcl-Naloxone Hcl]   . Vancomycin   . Vicodin [Hydrocodone-Acetaminophen]   . Vancomycin Rash  . Vicodin [Hydrocodone-Acetaminophen] Nausea Only    Antimicrobials this admission: Vanc 4/8>>4/9 Linezolid 4/9>> Unasyn 4/8>>  Dose adjustments this admission: N/A  Microbiology results: Sputum culture with E coli (R Unasyn) and kleb pneumo (S Unasyn)  Thank you for allowing pharmacy to be a part of this patient's care.  Janae Bridgeman, PharmD PGY1 Pharmacy Resident Phone: [phone] 04/23/2018 2:50 PM

## 2018-04-23 NOTE — Progress Notes (Signed)
Pt called out stating "I need my oxygen". Pt O2 sat checked, is 95% on RA. Pt placed on 2L O2 via [place] due to continued complaints of "needing Oxygen". Pt does not appear SOB, asked by this RN to explain his complaint.  Pt unable to explain further. Pt continued to sat 95% on 2L O2 by [place]. Pt's HR maintained at 94-96 during entire time RN in the room. NAD.

## 2018-04-23 NOTE — Progress Notes (Signed)
Pt resting in bed, watching TV. Appears in NAD. Pt provided with pain medication and diet coke.
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
## 2018-04-24 ENCOUNTER — Inpatient Hospital Stay (HOSPITAL_COMMUNITY): Payer: Medicaid Other

## 2018-04-24 DIAGNOSIS — R7881 Bacteremia: Secondary | ICD-10-CM

## 2018-04-24 LAB — MAGNESIUM: Magnesium: 2 mg/dL (ref 1.7–2.4)

## 2018-04-24 LAB — COMPREHENSIVE METABOLIC PANEL
ALT: 36 U/L (ref 0–44)
AST: 37 U/L (ref 15–41)
Albumin: 3.1 g/dL — ABNORMAL LOW (ref 3.5–5.0)
Alkaline Phosphatase: 88 U/L (ref 38–126)
Anion gap: 11 (ref 5–15)
BUN: 16 mg/dL (ref 6–20)
CO2: 26 mmol/L (ref 22–32)
Calcium: 9.1 mg/dL (ref 8.9–10.3)
Chloride: 102 mmol/L (ref 98–111)
Creatinine, Ser: 0.85 mg/dL (ref 0.61–1.24)
GFR calc Af Amer: >60 mL/min (ref >60)
GFR calc non Af Amer: >60 mL/min (ref >60)
Glucose, Bld: 130 mg/dL — ABNORMAL HIGH (ref 70–99)
Potassium: 4.5 mmol/L (ref 3.5–5.1)
Sodium: 139 mmol/L (ref 135–145)
Total Bilirubin: 0.4 mg/dL (ref 0.3–1.2)
Total Protein: 7.6 g/dL (ref 6.5–8.1)

## 2018-04-24 LAB — CBC WITH DIFFERENTIAL/PLATELET
Abs Immature Granulocytes: 0.03 10*3/uL (ref 0.00–0.07)
Basophils Absolute: 0.1 10*3/uL (ref 0.0–0.1)
Basophils Relative: 1 %
Eosinophils Absolute: 0.8 10*3/uL — ABNORMAL HIGH (ref 0.0–0.5)
Eosinophils Relative: 10 %
HCT: 38.4 % — ABNORMAL LOW (ref 39.0–52.0)
Hemoglobin: 11.1 g/dL — ABNORMAL LOW (ref 13.0–17.0)
Immature Granulocytes: 0 %
Lymphocytes Relative: 32 %
Lymphs Abs: 2.7 10*3/uL (ref 0.7–4.0)
MCH: 21.9 pg — ABNORMAL LOW (ref 26.0–34.0)
MCHC: 28.9 g/dL — ABNORMAL LOW (ref 30.0–36.0)
MCV: 75.7 fL — ABNORMAL LOW (ref 80.0–100.0)
Monocytes Absolute: 0.6 10*3/uL (ref 0.1–1.0)
Monocytes Relative: 8 %
Neutro Abs: 4.3 10*3/uL (ref 1.7–7.7)
Neutrophils Relative %: 49 %
Platelets: 372 10*3/uL (ref 150–400)
RBC: 5.07 MIL/uL (ref 4.22–5.81)
RDW: 19.9 % — ABNORMAL HIGH (ref 11.5–15.5)
WBC: 8.6 10*3/uL (ref 4.0–10.5)
nRBC: 0 % (ref 0.0–0.2)

## 2018-04-24 LAB — GLUCOSE, CAPILLARY
Glucose-Capillary: 111 mg/dL — ABNORMAL HIGH (ref 70–99)
Glucose-Capillary: 134 mg/dL — ABNORMAL HIGH (ref 70–99)
Glucose-Capillary: 97 mg/dL (ref 70–99)

## 2018-04-24 LAB — NOVEL CORONAVIRUS, NAA (HOSP ORDER, SEND-OUT TO REF LAB; TAT 18-24 HRS): SARS-CoV-2, NAA: NOT DETECTED

## 2018-04-24 LAB — PHOSPHORUS: Phosphorus: 4 mg/dL (ref 2.5–4.6)

## 2018-04-24 LAB — ECHOCARDIOGRAM COMPLETE
Height: 73 in
Weight: 2042.34 oz

## 2018-04-24 MED ORDER — ENSURE ENLIVE PO LIQD: 237.0000 mL | Freq: Two times a day (BID) | ORAL | 12 refills | Status: AC

## 2018-04-24 MED ORDER — CEFDINIR 300 MG PO CAPS: 300.0000 mg | ORAL_CAPSULE | Freq: Two times a day (BID) | ORAL | 0 refills | Status: AC

## 2018-04-24 MED ORDER — ACETAMINOPHEN 325 MG PO TABS: 650.0000 mg | ORAL_TABLET | Freq: Four times a day (QID) | ORAL | 0 refills | Status: AC | PRN

## 2018-04-24 NOTE — Plan of Care (Signed)
  Problem: Education: °Goal: Knowledge of General Education information will improve °Description: Including pain rating scale, medication(s)/side effects and non-pharmacologic comfort measures °Outcome: Adequate for Discharge °  °Problem: Activity: °Goal: Risk for activity intolerance will decrease °Outcome: Adequate for Discharge °  °Problem: Pain Managment: °Goal: General experience of comfort will improve °Outcome: Adequate for Discharge °  °Problem: Safety: °Goal: Ability to remain free from injury will improve °Outcome: Adequate for Discharge °  °Problem: Skin Integrity: °Goal: Risk for impaired skin integrity will decrease °Outcome: Adequate for Discharge °  °

## 2018-04-24 NOTE — Discharge Summary (Signed)
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 04/24/18 1150 04/24/18 1507  BP:  140/72 (!) 144/77 128/73  Pulse:  83 (!) 115 96  Resp:  16 20 20   Temp:  97.7 F (36.5 C) 98.3 F (36.8 C) 98 F (36.7 C)  TempSrc:  Oral    SpO2: 95% 97%  99%  Weight:      Height:       Examination: Physical Exam:  Constitutional: Thin cachectic disheveled appearing Caucasian male currently no acute distress appears calm but complaining of some phantom limb pain. Eyes: Lids and conjunctive are normal.  Sclera anicteric ENMT: External ears and nose appear normal.  Grossly normal hearing.  Patient has poor dentition. Neck: Supple no JVD Respiratory: Diminished auscultation bilaterally no appreciable wheezing, rales but he  does have coarse breath sounds and some rhonchi on the left lower lobe.  Not wearing any supplemental oxygen and has unlabored breathing Cardiovascular: Slightly tachycardic but regular rhythm.   Has a slight murmur Abdomen: Soft, nontender, nondistended.  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 Neurologic: Cranial nerves II through XII grossly intact no appreciable focal deficits.  Romberg sign cerebellar reflexes were not assessed. Psychiatric: Calm and pleasant today.  Has intact judgment insight.  He is awake and alert.  The results of significant diagnostics from this hospitalization (including imaging, microbiology, ancillary and laboratory) are listed below for reference.    Microbiology: Recent Results (from the past 240 hour(s))  Blood culture (routine x 2)     Status: None (Preliminary result)   Collection Time: 04/20/18  8:56 PM  Result Value Ref Range Status   Specimen Description BLOOD RIGHT ARM  Final   Special Requests   Final    BOTTLES DRAWN AEROBIC AND ANAEROBIC Blood Culture adequate volume   Culture   Final    NO GROWTH 4 DAYS Performed at [facility], [address] [place], [place] [address]    Report Status PENDING  Incomplete  Blood culture (routine x 2)     Status: None (Preliminary result)   Collection Time: 04/20/18  8:58 PM  Result Value Ref Range Status   Specimen Description BLOOD LEFT ANTECUBITAL  Final   Special Requests   Final    BOTTLES DRAWN AEROBIC AND ANAEROBIC Blood Culture adequate volume   Culture   Final    NO GROWTH 4 DAYS Performed at [facility], [place] [address] [place], [place] [address]    Report Status PENDING  Incomplete  Acid Fast Smear (AFB)     Status: None   Collection Time: 04/20/18  9:29 PM  Result Value Ref Range Status   AFB Specimen Processing Concentration  Final   Acid Fast Smear Negative  Final    Comment: (NOTE) Performed At: [facility] [place], [place] [id number] Rush Farmer MD IR:[id number]    Source (AFB) SPUTUM   Final    Comment: Performed at [facility], [place] [address], [place], [place] [address]  Culture, respiratory     Status: None   Collection Time: 04/20/18  9:29 PM  Result Value Ref Range Status   Specimen Description SPUTUM  Final   Special Requests NONE  Final   Gram Stain   Final    MODERATE WBC PRESENT,BOTH PMN AND MONONUCLEAR ABUNDANT GRAM NEGATIVE RODS RARE GRAM POSITIVE COCCI FEW YEAST MODERATE SQUAMOUS EPITHELIAL CELLS PRESENT Performed at [facility], [place] [address] [place], [place] [address]    Culture   Final    MODERATE ESCHERICHIA COLI MODERATE KLEBSIELLA PNEUMONIAE    Report Status 04/23/2018 FINAL  Final   Organism ID, Bacteria ESCHERICHIA COLI  Final   Organism ID, Bacteria KLEBSIELLA PNEUMONIAE  Final      Susceptibility   Escherichia coli - MIC*    AMPICILLIN >=32 RESISTANT Resistant     CEFAZOLIN <=4 SENSITIVE Sensitive     CEFEPIME <=1 SENSITIVE Sensitive     CEFTAZIDIME <=1 SENSITIVE Sensitive     CEFTRIAXONE <=1 SENSITIVE Sensitive     CIPROFLOXACIN <=0.25 SENSITIVE Sensitive     GENTAMICIN <=1 SENSITIVE Sensitive  IMIPENEM <=0.25 SENSITIVE Sensitive     TRIMETH/SULFA >=320 RESISTANT Resistant     AMPICILLIN/SULBACTAM >=32 RESISTANT Resistant     PIP/TAZO >=128 RESISTANT Resistant     Extended ESBL NEGATIVE Sensitive     * MODERATE ESCHERICHIA COLI   Klebsiella pneumoniae - MIC*    AMPICILLIN RESISTANT Resistant     CEFAZOLIN <=4 SENSITIVE Sensitive     CEFEPIME <=1 SENSITIVE Sensitive     CEFTAZIDIME <=1 SENSITIVE Sensitive     CEFTRIAXONE <=1 SENSITIVE Sensitive     CIPROFLOXACIN <=0.25 SENSITIVE Sensitive     GENTAMICIN <=1 SENSITIVE Sensitive     IMIPENEM <=0.25 SENSITIVE Sensitive     TRIMETH/SULFA <=20 SENSITIVE Sensitive     AMPICILLIN/SULBACTAM 4 SENSITIVE Sensitive     PIP/TAZO <=4 SENSITIVE Sensitive     Extended ESBL NEGATIVE Sensitive     * MODERATE KLEBSIELLA PNEUMONIAE  Novel Coronavirus, NAA (hospital order;  send-out to ref lab)     Status: None   Collection Time: 04/20/18 10:32 PM  Result Value Ref Range Status   SARS-CoV-2, NAA NOT DETECTED NOT DETECTED Final    Comment: Negative (Not Detected) results do not exclude infection caused by SARS CoV 2 and should not be used as the sole basis for treatment or other patient management decisions. Optimum specimen types and timing for peak viral levels during infections caused  by SARS CoV 2 have not been determined. Collection of multiple specimens (types and time points) from the same patient may be necessary to detect the virus. Improper specimen collection and handling, sequence variability underlying assay primers and or probes, or the presence of organisms in  quantities less than the limit of detection of the assay may lead to false negative results. Positive and negative predictive values of testing are highly dependent on prevalence. False negative results are more likely when prevalence of disease is high. (NOTE) The expected result is Negative (Not Detected). The SARS CoV 2 test is intended for the presumptive qualitative  detection of nucleic acid from SARS CoV 2 in upper and lower  respir atory specimens. Testing methodology is real time RT PCR. Test results must be correlated with clinical presentation and  evaluated in the context of other laboratory and epidemiologic data.  Test performance can be affected because the epidemiology and  clinical spectrum of infection caused by SARS CoV 2 is not fully  known. For example, the optimum types of specimens to collect and  when during the course of infection these specimens are most likely  to contain detectable viral RNA may not be known. This test has not been Food and Drug Administration (FDA) cleared or  approved and has been authorized by FDA under an Emergency Use  Authorization (EUA). The test is only authorized for the duration of  the declaration that circumstances exist justifying  the authorization  of emergency use of in vitro diagnostic tests for detection and or  diagnosis of SARS CoV 2 under Section 564(b)(1) of the Act, 21 U.S.C.  section 6020654646 3(b)(1), unless the authorization is terminated or   revoked sooner. [facility] is certified under the  Clinical Laboratory Improvement Amendments of 1988 (CLIA), 42 U.S.C.  section [phone], to perform high complexity tests. Performed at [place] [id number], [place], [place] [address] Laboratory Director: Loleta Books, MD Fact Sheet for Healthcare Providers  BankingDealers.[email or web address] Fact Sheet for Patients  StrictlyIdeas.no    Coronavirus Source NASOPHARYNGEAL  Final    Comment: Performed at [facility], [place] [address], [place], [place] [address]  Acid Fast Smear (AFB)     Status: None   Collection Time: 04/22/18  5:00 AM  Result Value Ref Range Status   AFB Specimen Processing Concentration  Final   Acid Fast Smear Negative  Final    Comment: (NOTE) Performed At: [facility] [place], [place] [id number] Rush Farmer MD KD:[id number]    Source (AFB) SPU  Final    Comment: Performed at [facility], [place] [address] [place], [place] [address]  Acid Fast Smear (AFB)     Status: None   Collection Time: 04/22/18  5:40 AM  Result Value Ref Range Status   AFB Specimen Processing Concentration  Final   Acid Fast Smear Negative  Final    Comment: (NOTE) Performed At: [facility] [place], [place] [id number] Rush Farmer MD BH:[id number]    Source (AFB) SPU  Final    Comment: Performed at [facility], [place] [address] [place], [place] [address]    Labs: BNP (last 3 results) Recent Labs    04/05/18 1313 04/20/18 1642  BNP 60.4 299.2*   Basic Metabolic Panel: Recent Labs  Lab 04/20/18 2350 04/21/18 0953 04/22/18 0334  04/23/18 0630 04/24/18 0742  NA 137 139 140 139 139  K 3.8 3.9 4.2 4.1 4.5  CL 101 103 101 102 102  CO2 28 26 28 25 26   GLUCOSE 125* 189* 116* 133* 130*  BUN 11 9 13 15 16   CREATININE 0.79 0.79 0.77 0.79 0.85  CALCIUM 8.8* 8.6* 8.9 9.0 9.1  MG  --  1.8 2.0 2.0 2.0  PHOS  --  3.8 3.4 3.0 4.0   Liver Function Tests: Recent Labs  Lab 04/20/18 1642 04/21/18 0953 04/22/18 0334 04/23/18 0630 04/24/18 0742  AST 22 17 14* 23 37  ALT 15 14 12 19  36  ALKPHOS 110 96 94 88 88  BILITOT 0.5 0.3 0.4 0.3 0.4  PROT 7.8 6.8 7.0 7.2 7.6  ALBUMIN 3.2* 2.6* 2.8* 2.9* 3.1*   No results for input(s): LIPASE, AMYLASE in the last 168 hours. No results for input(s): AMMONIA in the last 168 hours. CBC: Recent Labs  Lab 04/20/18 2350 04/21/18 0953 04/22/18 0334 04/23/18 0630 04/24/18 0742  WBC 7.4 6.7 8.6 8.0 8.6  NEUTROABS 4.0 3.6 4.3 4.5 4.3  HGB 9.2* 9.7* 10.9* 10.6* 11.1*  HCT 32.3* 34.0* 36.6* 35.7* 38.4*  MCV 75.8* 76.4* 75.8* 75.0* 75.7*  PLT 388 357 404* 421* 372   Cardiac Enzymes: Recent Labs  Lab 04/20/18 1642  TROPONINI <0.03   BNP: Invalid input(s): POCBNP CBG: Recent Labs  Lab 04/23/18 1204 04/23/18 1538 04/23/18 2123 04/24/18 0851 04/24/18 1147  GLUCAP 180* 155* 110* 111* 134*   D-Dimer No results for input(s): DDIMER in the last 72 hours. Hgb A1c No results for input(s): HGBA1C in the last 72 hours. Lipid Profile No results for input(s): CHOL, HDL, LDLCALC, TRIG, CHOLHDL, LDLDIRECT in the last 72 hours. Thyroid function studies No results for input(s): TSH, T4TOTAL, T3FREE, THYROIDAB in the last 72 hours.  Invalid input(s): FREET3 Anemia work up Recent Labs    04/22/18 1130 04/23/18 0630  VITAMINB12 439 463  FOLATE 5.8* 8.1  FERRITIN 45 50  TIBC 322 333  IRON 28* 28*  RETICCTPCT 2.0 2.2   Urinalysis    Component Value Date/Time   COLORURINE YELLOW 03/16/2018 1904  APPEARANCEUR CLEAR 03/16/2018 1904   LABSPEC 1.042 (H) 03/16/2018 1904    PHURINE 7.0 03/16/2018 1904   GLUCOSEU NEGATIVE 03/16/2018 1904   GLUCOSEU > 1000 mg/dL (A) 04/06/2008 2035   HGBUR NEGATIVE 03/16/2018 1904   BILIRUBINUR NEGATIVE 03/16/2018 1904   KETONESUR NEGATIVE 03/16/2018 1904   PROTEINUR NEGATIVE 03/16/2018 1904   UROBILINOGEN 1.0 11/21/2014 1600   NITRITE NEGATIVE 03/16/2018 1904   LEUKOCYTESUR NEGATIVE 03/16/2018 1904   Sepsis Labs Invalid input(s): PROCALCITONIN,  WBC,  LACTICIDVEN Microbiology Recent Results (from the past 240 hour(s))  Blood culture (routine x 2)     Status: None (Preliminary result)   Collection Time: 04/20/18  8:56 PM  Result Value Ref Range Status   Specimen Description BLOOD RIGHT ARM  Final   Special Requests   Final    BOTTLES DRAWN AEROBIC AND ANAEROBIC Blood Culture adequate volume   Culture   Final    NO GROWTH 4 DAYS Performed at [facility], [place] [address], [place], [place] [address]    Report Status PENDING  Incomplete  Blood culture (routine x 2)     Status: None (Preliminary result)   Collection Time: 04/20/18  8:58 PM  Result Value Ref Range Status   Specimen Description BLOOD LEFT ANTECUBITAL  Final   Special Requests   Final    BOTTLES DRAWN AEROBIC AND ANAEROBIC Blood Culture adequate volume   Culture   Final    NO GROWTH 4 DAYS Performed at [facility], [place] [address], [place], [place] [address]    Report Status PENDING  Incomplete  Acid Fast Smear (AFB)     Status: None   Collection Time: 04/20/18  9:29 PM  Result Value Ref Range Status   AFB Specimen Processing Concentration  Final   Acid Fast Smear Negative  Final    Comment: (NOTE) Performed At: [facility] [place], [place] [id number] Rush Farmer MD NI:[id number]    Source (AFB) SPUTUM  Final    Comment: Performed at [facility], [place] [address], [place], [place] [address]  Culture, respiratory     Status: None   Collection Time: 04/20/18  9:29 PM  Result Value Ref Range Status    Specimen Description SPUTUM  Final   Special Requests NONE  Final   Gram Stain   Final    MODERATE WBC PRESENT,BOTH PMN AND MONONUCLEAR ABUNDANT GRAM NEGATIVE RODS RARE GRAM POSITIVE COCCI FEW YEAST MODERATE SQUAMOUS EPITHELIAL CELLS PRESENT Performed at [facility], [place] [address], [place], [place] [address]    Culture   Final    MODERATE ESCHERICHIA COLI MODERATE KLEBSIELLA PNEUMONIAE    Report Status 04/23/2018 FINAL  Final   Organism ID, Bacteria ESCHERICHIA COLI  Final   Organism ID, Bacteria KLEBSIELLA PNEUMONIAE  Final      Susceptibility   Escherichia coli - MIC*    AMPICILLIN >=32 RESISTANT Resistant     CEFAZOLIN <=4 SENSITIVE Sensitive     CEFEPIME <=1 SENSITIVE Sensitive     CEFTAZIDIME <=1 SENSITIVE Sensitive     CEFTRIAXONE <=1 SENSITIVE Sensitive     CIPROFLOXACIN <=0.25 SENSITIVE Sensitive     GENTAMICIN <=1 SENSITIVE Sensitive     IMIPENEM <=0.25 SENSITIVE Sensitive     TRIMETH/SULFA >=320 RESISTANT Resistant     AMPICILLIN/SULBACTAM >=32 RESISTANT Resistant     PIP/TAZO >=128 RESISTANT Resistant     Extended ESBL NEGATIVE Sensitive     * MODERATE ESCHERICHIA COLI  Klebsiella pneumoniae - MIC*    AMPICILLIN RESISTANT Resistant     CEFAZOLIN <=4 SENSITIVE Sensitive     CEFEPIME <=1 SENSITIVE Sensitive     CEFTAZIDIME <=1 SENSITIVE Sensitive     CEFTRIAXONE <=1 SENSITIVE Sensitive     CIPROFLOXACIN <=0.25 SENSITIVE Sensitive     GENTAMICIN <=1 SENSITIVE Sensitive     IMIPENEM <=0.25 SENSITIVE Sensitive     TRIMETH/SULFA <=20 SENSITIVE Sensitive     AMPICILLIN/SULBACTAM 4 SENSITIVE Sensitive     PIP/TAZO <=4 SENSITIVE Sensitive     Extended ESBL NEGATIVE Sensitive     * MODERATE KLEBSIELLA PNEUMONIAE  Novel Coronavirus, NAA (hospital order; send-out to ref lab)     Status: None   Collection Time: 04/20/18 10:32 PM  Result Value Ref Range Status   SARS-CoV-2, NAA NOT DETECTED NOT DETECTED Final    Comment: Negative (Not Detected) results do not  exclude infection caused by SARS CoV 2 and should not be used as the sole basis for treatment or other patient management decisions. Optimum specimen types and timing for peak viral levels during infections caused  by SARS CoV 2 have not been determined. Collection of multiple specimens (types and time points) from the same patient may be necessary to detect the virus. Improper specimen collection and handling, sequence variability underlying assay primers and or probes, or the presence of organisms in  quantities less than the limit of detection of the assay may lead to false negative results. Positive and negative predictive values of testing are highly dependent on prevalence. False negative results are more likely when prevalence of disease is high. (NOTE) The expected result is Negative (Not Detected). The SARS CoV 2 test is intended for the presumptive qualitative  detection of nucleic acid from SARS CoV 2 in upper and lower  respir atory specimens. Testing methodology is real time RT PCR. Test results must be correlated with clinical presentation and  evaluated in the context of other laboratory and epidemiologic data.  Test performance can be affected because the epidemiology and  clinical spectrum of infection caused by SARS CoV 2 is not fully  known. For example, the optimum types of specimens to collect and  when during the course of infection these specimens are most likely  to contain detectable viral RNA may not be known. This test has not been Food and Drug Administration (FDA) cleared or  approved and has been authorized by FDA under an Emergency Use  Authorization (EUA). The test is only authorized for the duration of  the declaration that circumstances exist justifying the authorization  of emergency use of in vitro diagnostic tests for detection and or  diagnosis of SARS CoV 2 under Section 564(b)(1) of the Act, 21 U.S.C.  section [phone] 3(b)(1), unless the authorization is  terminated or   revoked sooner. [facility] is certified under the  Clinical Laboratory Improvement Amendments of 1988 (CLIA), 42 U.S.C.  section [phone], to perform high complexity tests. Performed at [facility] [id number] [address], [place], [place] [address] Laboratory Director: Loleta Books, MD Fact Sheet for Healthcare Providers  BankingDealers.co.za Fact Sheet for Patients  StrictlyIdeas.no    Coronavirus Source NASOPHARYNGEAL  Final    Comment: Performed at [facility], [address] [place], [place] [address]  Acid Fast Smear (AFB)     Status: None   Collection Time: 04/22/18  5:00 AM  Result Value Ref Range Status  AFB Specimen Processing Concentration  Final   Acid Fast Smear Negative  Final    Comment: (NOTE) Performed At: [facility] [place], [place] [id number] Rush Farmer MD [id number]    Source (AFB) SPU  Final    Comment: Performed at [facility], [place] [address] [place], [place] [address]  Acid Fast Smear (AFB)     Status: None   Collection Time: 04/22/18  5:40 AM  Result Value Ref Range Status   AFB Specimen Processing Concentration  Final   Acid Fast Smear Negative  Final    Comment: (NOTE) Performed At: [facility] [place], [place] [id number] Rush Farmer MD FB:[id number]    Source (AFB) SPU  Final    Comment: Performed at [facility], [place] [address] [place], [place] [address]   Time coordinating discharge: 35 minutes  SIGNED:  Kerney Elbe, DO [facility] 04/24/2018, 4:12 PM Pager is on [place]  If 7PM-7AM, please contact night-coverage [email or web address] Password [email or web address]

## 2018-04-24 NOTE — Progress Notes (Signed)
NURSING PROGRESS NOTE  [name] [id number] Discharge Data: 04/24/2018 6:16 PM Attending Provider: Kerney Elbe, DO QVZ:DGLO, Ander Gaster, MD     [name] to be D/C'd Home per MD order.  Discussed with the patient the After Visit Summary and all questions fully answered. All IV's discontinued with no bleeding noted. All belongings returned to patient for patient to take home. Pt was given po pain medication and bowel movement was cleaned up.  Last Vital Signs:  Blood pressure 128/73, pulse 96, temperature 98 F (36.7 C), resp. rate 20, height 6\' 1"  (1.854 m), weight 57.9 kg, SpO2 99 %.  Discharge Medication List Allergies as of 04/24/2018      Reactions   Buprenorphine Hcl-naloxone Hcl Shortness Of Breath, Other (See Comments)   "Felt like I was going to die," was  jittery, had trouble breathing, felt hot (reaction to Suboxone) PER THE NCCSRS database, the patient received #42 Suboxone films between 03/26/17 and 04/02/17 from [facility](??)   Ciprofloxacin Anaphylaxis   Morphine-naltrexone Other (See Comments)   Seizures   Shellfish-derived Products Anaphylaxis, Hives, Swelling   Benadryl [diphenhydramine Hcl] Hives, Itching, Rash   Fish Allergy Hives, Swelling, Rash   Morphine And Related Other (See Comments)   Seizures   Benadryl [diphenhydramine]    Ciprofloxacin    Fish Allergy    Morphine And Related    Shellfish Allergy    Suboxone [buprenorphine Hcl-naloxone Hcl]    Vancomycin    Vicodin [hydrocodone-acetaminophen]    Vancomycin Rash   Vicodin [hydrocodone-acetaminophen] Nausea Only      Medication List    STOP taking these medications   predniSONE 10 MG tablet Commonly known as:  DELTASONE     TAKE these medications   Accu-Chek Aviva device Use as instructed to check blood sugar once per day   accu-chek soft touch lancets Use as instructed once per day when checking blood sugar   acetaminophen 325 MG tablet Commonly known as:   TYLENOL Take 2 tablets (650 mg total) by mouth every 6 (six) hours as needed for mild pain (or Fever >/= 101).   albuterol (2.5 MG/3ML) 0.083% nebulizer solution Commonly known as:  PROVENTIL Take 3 mLs (2.5 mg total) by nebulization every 6 (six) hours as needed for wheezing or shortness of breath. What changed:  Another medication with the same name was changed. Make sure you understand how and when to take each.   ProAir HFA 108 (90 Base) MCG/ACT inhaler Generic drug:  albuterol INHALE 2 PUFFS BY MOUTH EVERY 4 HOURS AS NEEDED What changed:  reasons to take this   cefdinir 300 MG capsule Commonly known as:  OMNICEF Take 1 capsule (300 mg total) by mouth 2 (two) times daily for 30 days.   clopidogrel 75 MG tablet Commonly known as:  PLAVIX Take 1 tablet (75 mg total) by mouth daily.   feeding supplement (ENSURE ENLIVE) Liqd Take 237 mLs by mouth 2 (two) times daily between meals. Start taking on:  April 25, 2018   fluticasone 50 MCG/ACT nasal spray Commonly known as:  FLONASE Place 2 sprays into both nostrils daily.   glucose blood test strip Commonly known as:  Accu-Chek Aviva Use as instructed once daily to check blood sugar   guaiFENesin 600 MG 12 hr tablet Commonly known as:  MUCINEX Take 2 tablets (1,200 mg total) by mouth 2 (two) times daily. What changed:    how much to take  when to take this   hydrOXYzine  25 MG capsule Commonly known as:  VISTARIL TAKE (1) CAPSULE BY MOUTH TWICE DAILY. What changed:  See the new instructions.   nicotine 21 mg/24hr patch Commonly known as:  NICODERM CQ - dosed in mg/24 hours Place 1 patch (21 mg total) onto the skin daily.   oxyCODONE-acetaminophen 5-325 MG tablet Commonly known as:  PERCOCET/ROXICET Take 2 tablets by mouth every 4 (four) hours as needed for severe pain.   pantoprazole 20 MG tablet Commonly known as:  PROTONIX TAKE 1 TABLET BY MOUTH IN THE MORNING. What changed:  when to take this   pregabalin 75  MG capsule Commonly known as:  Lyrica TAKE 1 CAPSULE BY MOUTH THREE TIMES A DAY What changed:    how much to take  how to take this  when to take this  additional instructions   ranitidine 150 MG tablet Commonly known as:  ZANTAC TAKE (2) TABLETS BY MOUTH ONCE DAILY. What changed:  See the new instructions.

## 2018-04-24 NOTE — Progress Notes (Signed)
SATURATION QUALIFICATIONS: (This note is used to comply with regulatory documentation for home oxygen)  Patient Saturations on Room Air at Rest = 100%  Patient Saturations on Room Air while Ambulating = 95%  Patient Saturations on 0  Liters of oxygen while Ambulating = 95%  Please briefly explain why patient needs home oxygen: no need

## 2018-04-24 NOTE — Progress Notes (Signed)
  Echocardiogram 2D Echocardiogram has been performed.  [name] 04/24/2018, 10:59 AM

## 2018-04-24 NOTE — Progress Notes (Signed)
Received report from [facility]

## 2018-04-24 NOTE — Progress Notes (Signed)
   INFECTIOUS DISEASE ATTENDING ADDENDUM:   Patient has had negative COVID 2019 test and THREE NEGATIVE AFB SMEARS  DC CONTACT, [facility] and DC to NON CoVID unit

## 2018-04-24 NOTE — Progress Notes (Signed)
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 Transdermal Daily   pantoprazole  20 mg Oral q morning - 10a   pregabalin  75 mg Oral TID   sodium chloride flush  3 mL Intravenous Q12H   Continuous Infusions:  sodium chloride     cefTRIAXone (ROCEPHIN)  IV 2 g (04/23/18 2131)    LOS: 4 days   Kerney Elbe, DO [facility] PAGER is on AMION  If 7PM-7AM, please contact night-coverage [email or web address] Password [facility] 04/24/2018, 2:22 PM

## 2018-04-25 ENCOUNTER — Telehealth: Payer: Self-pay

## 2018-04-25 LAB — CULTURE, BLOOD (ROUTINE X 2)
Culture: NO GROWTH
Culture: NO GROWTH
Special Requests: ADEQUATE
Special Requests: ADEQUATE

## 2018-04-25 MED FILL — CEFDINIR 300 MG CAPSULE: 300 | 30 days supply | Qty: 60 | Fill #0

## 2018-04-25 NOTE — Telephone Encounter (Signed)
Transition Care Management Follow-up Telephone Call Date of discharge and from where: 04/24/2018, [facility].  Call placed to [phone], message left requesting a call back to this CM # [phone]

## 2018-04-26 ENCOUNTER — Telehealth: Payer: Self-pay

## 2018-04-26 NOTE — Telephone Encounter (Signed)
Transition Care Management Follow-up Telephone Call  #2  Date of discharge and from where: 04/24/2018, [facility].  Call placed to [phone], message left requesting a call back to this CM # [phone]

## 2018-04-28 ENCOUNTER — Telehealth: Payer: Self-pay

## 2018-04-28 NOTE — Telephone Encounter (Signed)
Transition Care Management Follow-up Telephone Call    Date of discharge and from where:04/24/2018, [facility].  Call placed [phone], message left requesting a call back to this CM # [phone]

## 2018-04-29 NOTE — Progress Notes (Signed)
Patient is scheduled for 4-21 with wright

## 2018-05-03 ENCOUNTER — Inpatient Hospital Stay: Payer: Medicaid Other | Admitting: Family

## 2018-05-03 ENCOUNTER — Other Ambulatory Visit: Payer: Self-pay

## 2018-05-03 ENCOUNTER — Encounter: Payer: Self-pay | Admitting: Critical Care Medicine

## 2018-05-03 ENCOUNTER — Ambulatory Visit: Payer: Medicaid Other | Attending: Critical Care Medicine | Admitting: Critical Care Medicine
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
## 2018-05-03 NOTE — Progress Notes (Addendum)
Called patient to initiate their telephone visit with provider Dr. Joya Gaskins. Call went straight to voicemail. Left voicemail stating that I was trying to initiate telephone visit at 1:57 pm on 05/03/18. KWalker, CMA.  Called patient to initiate their telephone visit with provider Dr. Joya Gaskins. Call went straight to voicemail. Left voicemail stating that I was trying to initiate telephone visit at 2:04 pm on 05/03/18. KWalker, CMA.

## 2018-05-09 ENCOUNTER — Ambulatory Visit: Payer: Self-pay | Admitting: Family Medicine

## 2018-05-10 ENCOUNTER — Telehealth: Payer: Self-pay

## 2018-05-10 ENCOUNTER — Telehealth: Payer: Self-pay | Admitting: Family Medicine

## 2018-05-10 NOTE — Telephone Encounter (Signed)
Patient called stating his car broke down at the time of the appt and called to speak with you, Please follow up.

## 2018-05-10 NOTE — Telephone Encounter (Signed)
Attempted to contact patient to check on him as he missed his appointment with Dr Chapman Fitch yesterday.  Call placed [phone], message left requesting a call back to this CM # [phone].  Letter received from [facility] of [place] noting that the patient is closed to their services because they have not been able to reach him,

## 2018-05-11 NOTE — Telephone Encounter (Signed)
Call returned to patient.  He said that his car broke down on his way to his appointment last week. Informed him that it was a televisit that was scheduled and he did not need to come to the clinic.   He then asked about scheduling an appointment for his wheelchair,   Appointment has been scheduled for 05/18/2018 @ 1450. Informed him that he will be contacted if that needs to change and if the provider would be calling him instead of an in person visit.  When asked how he was feeling, he said he was okay. He reports that he has all medications and has been taking them and did not have any questions or want to review the medication list.  He was anxious to end the call and then hung up.

## 2018-05-11 NOTE — Telephone Encounter (Signed)
Follow up   Pt states he is returning your call

## 2018-05-11 NOTE — Telephone Encounter (Signed)
His recent visit was supposed to be in person as he would need a face to face visit in order to be assessed for the need for a powered mobility device however he could not be reached prior to his appointment to see if he had paperwork from a particular company and if the visit was indeed regarding a power wheelchair

## 2018-05-12 ENCOUNTER — Telehealth: Payer: Self-pay | Admitting: Plastic Surgery

## 2018-05-12 NOTE — Telephone Encounter (Signed)
I have some paperwork that was in my inbox regarding his power chair exam. 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

## 2018-05-12 NOTE — Telephone Encounter (Signed)
Called patient to confirm appointment scheduled for tomorrow. Patient answered the following questions: 1.Has the patient traveled outside of the state of [place] at all within the past 6 weeks? No 2.Does the patient have a fever or cough at all? No 3.Has the patient been tested for COVID? Had a positive COVID test? Yes- results were negative.  4. Has the patient been in contact with anyone who has tested positive? No

## 2018-05-12 NOTE — Telephone Encounter (Signed)
I have always been told that there needs to be a documented "face to face encounter" to prescribe/order a powered mobility device therefore his visit would need to be done here in the office

## 2018-05-13 ENCOUNTER — Other Ambulatory Visit: Payer: Self-pay | Admitting: Family Medicine

## 2018-05-13 ENCOUNTER — Institutional Professional Consult (permissible substitution): Payer: Self-pay | Admitting: Plastic Surgery

## 2018-05-13 DIAGNOSIS — I639 Cerebral infarction, unspecified: Secondary | ICD-10-CM

## 2018-05-13 DIAGNOSIS — I739 Peripheral vascular disease, unspecified: Secondary | ICD-10-CM

## 2018-05-14 ENCOUNTER — Encounter (HOSPITAL_COMMUNITY): Payer: Self-pay | Admitting: Emergency Medicine

## 2018-05-14 ENCOUNTER — Emergency Department (HOSPITAL_COMMUNITY): Payer: Medicaid Other

## 2018-05-14 ENCOUNTER — Emergency Department (HOSPITAL_COMMUNITY)
Admission: EM | Admit: 2018-05-14 | Discharge: 2018-05-14 | Disposition: A | Discharge status: Home / Self Care | Payer: Medicaid Other | Attending: Emergency Medicine | Admitting: Emergency Medicine

## 2018-05-14 ENCOUNTER — Other Ambulatory Visit: Payer: Self-pay

## 2018-05-14 DIAGNOSIS — E119 Type 2 diabetes mellitus without complications: Secondary | ICD-10-CM | POA: Insufficient documentation

## 2018-05-14 DIAGNOSIS — Z20828 Contact with and (suspected) exposure to other viral communicable diseases: Secondary | ICD-10-CM | POA: Insufficient documentation

## 2018-05-14 DIAGNOSIS — R05 Cough: Secondary | ICD-10-CM | POA: Diagnosis not present

## 2018-05-14 DIAGNOSIS — I509 Heart failure, unspecified: Secondary | ICD-10-CM | POA: Insufficient documentation

## 2018-05-14 DIAGNOSIS — I739 Peripheral vascular disease, unspecified: Secondary | ICD-10-CM | POA: Diagnosis not present

## 2018-05-14 DIAGNOSIS — Z86711 Personal history of pulmonary embolism: Secondary | ICD-10-CM | POA: Insufficient documentation

## 2018-05-14 DIAGNOSIS — J441 Chronic obstructive pulmonary disease with (acute) exacerbation: Secondary | ICD-10-CM | POA: Insufficient documentation

## 2018-05-14 DIAGNOSIS — Z79899 Other long term (current) drug therapy: Secondary | ICD-10-CM | POA: Insufficient documentation

## 2018-05-14 DIAGNOSIS — F17210 Nicotine dependence, cigarettes, uncomplicated: Secondary | ICD-10-CM | POA: Diagnosis not present

## 2018-05-14 DIAGNOSIS — Z7901 Long term (current) use of anticoagulants: Secondary | ICD-10-CM | POA: Insufficient documentation

## 2018-05-14 DIAGNOSIS — R0600 Dyspnea, unspecified: Secondary | ICD-10-CM | POA: Diagnosis present

## 2018-05-14 DIAGNOSIS — I10 Essential (primary) hypertension: Secondary | ICD-10-CM | POA: Diagnosis not present

## 2018-05-14 DIAGNOSIS — M069 Rheumatoid arthritis, unspecified: Secondary | ICD-10-CM | POA: Insufficient documentation

## 2018-05-14 LAB — BASIC METABOLIC PANEL
Anion gap: 12 (ref 5–15)
BUN: 18 mg/dL (ref 6–20)
CO2: 23 mmol/L (ref 22–32)
Calcium: 9.4 mg/dL (ref 8.9–10.3)
Chloride: 101 mmol/L (ref 98–111)
Creatinine, Ser: 0.91 mg/dL (ref 0.61–1.24)
GFR calc Af Amer: >60 mL/min (ref >60)
GFR calc non Af Amer: >60 mL/min (ref >60)
Glucose, Bld: 120 mg/dL — ABNORMAL HIGH (ref 70–99)
Potassium: 4.2 mmol/L (ref 3.5–5.1)
Sodium: 136 mmol/L (ref 135–145)

## 2018-05-14 LAB — CBC WITH DIFFERENTIAL/PLATELET
Abs Immature Granulocytes: 0.03 10*3/uL (ref 0.00–0.07)
Basophils Absolute: 0 10*3/uL (ref 0.0–0.1)
Basophils Relative: 0 %
Eosinophils Absolute: 0.4 10*3/uL (ref 0.0–0.5)
Eosinophils Relative: 4 %
HCT: 39.4 % (ref 39.0–52.0)
Hemoglobin: 11.7 g/dL — ABNORMAL LOW (ref 13.0–17.0)
Immature Granulocytes: 0 %
Lymphocytes Relative: 23 %
Lymphs Abs: 2 10*3/uL (ref 0.7–4.0)
MCH: 23.2 pg — ABNORMAL LOW (ref 26.0–34.0)
MCHC: 29.7 g/dL — ABNORMAL LOW (ref 30.0–36.0)
MCV: 78 fL — ABNORMAL LOW (ref 80.0–100.0)
Monocytes Absolute: 0.6 10*3/uL (ref 0.1–1.0)
Monocytes Relative: 7 %
Neutro Abs: 5.9 10*3/uL (ref 1.7–7.7)
Neutrophils Relative %: 66 %
Platelets: 374 10*3/uL (ref 150–400)
RBC: 5.05 MIL/uL (ref 4.22–5.81)
RDW: 19.8 % — ABNORMAL HIGH (ref 11.5–15.5)
WBC: 9 10*3/uL (ref 4.0–10.5)
nRBC: 0 % (ref 0.0–0.2)

## 2018-05-14 LAB — BRAIN NATRIURETIC PEPTIDE: B Natriuretic Peptide: 46.3 pg/mL (ref 0.0–100.0)

## 2018-05-14 LAB — SARS CORONAVIRUS 2 BY RT PCR (HOSPITAL ORDER, PERFORMED IN ~~LOC~~ HOSPITAL LAB): SARS Coronavirus 2: NEGATIVE

## 2018-05-14 MED ORDER — PREDNISONE 20 MG PO TABS: 40.0000 mg | ORAL_TABLET | Freq: Every day | ORAL | 0 refills | Status: DC

## 2018-05-14 MED ORDER — METHYLPREDNISOLONE SODIUM SUCC 125 MG IJ SOLR
80.0000 mg | Freq: Once | INTRAMUSCULAR | Status: AC
  Administered 2018-05-14: 15:00:00 80 mg via INTRAVENOUS
  Filled 2018-05-14: qty 2

## 2018-05-14 MED ORDER — ALBUTEROL SULFATE HFA 108 (90 BASE) MCG/ACT IN AERS
4.0000 | INHALATION_SPRAY | Freq: Once | RESPIRATORY_TRACT | Status: AC
  Administered 2018-05-14: 4 via RESPIRATORY_TRACT
  Filled 2018-05-14: qty 6.7

## 2018-05-14 NOTE — ED Triage Notes (Signed)
Pt BIB [facility] from home complaining of shortness of breath since last night. Pt finished his steroids, and still taking antibiotics as prescribed. Pt tested negative for covid 3 weeks ago.

## 2018-05-14 NOTE — Discharge Instructions (Addendum)
I think you are having an exacerbation of your COPD. Your chest x-ray does not show any acute abnormality.  You do not have a fever.  Here COVID test was negative.  I am placing you on a course of steroids.  Continue to use the albuterol inhaler he got in the emergency room of 1 to 2 puffs every 4 hours as needed in addition to your nebulized treatments.  I want you to follow-up with your PCP.  If you feel like your symptoms are getting significantly worse you develop a fever or other concerning symptoms then please return the emergency room.

## 2018-05-14 NOTE — ED Notes (Signed)
PTAR at bedside 
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
## 2018-05-18 ENCOUNTER — Ambulatory Visit: Payer: Medicaid Other | Admitting: Family Medicine

## 2018-05-19 MED FILL — predniSONE 10 MG TABS: 10 | 4 days supply | Qty: 20 | Fill #0

## 2018-05-24 ENCOUNTER — Ambulatory Visit: Payer: Self-pay | Admitting: Family Medicine

## 2018-05-26 ENCOUNTER — Ambulatory Visit: Payer: Medicaid Other | Attending: Family Medicine | Admitting: Family Medicine

## 2018-05-26 ENCOUNTER — Encounter: Payer: Self-pay | Admitting: Family Medicine

## 2018-05-26 ENCOUNTER — Other Ambulatory Visit: Payer: Self-pay

## 2018-05-26 VITALS — BP 114/70 | HR 105 | Temp 98.0°F | Ht 71.0 in

## 2018-05-26 DIAGNOSIS — J189 Pneumonia, unspecified organism: Secondary | ICD-10-CM | POA: Diagnosis not present

## 2018-05-26 DIAGNOSIS — E119 Type 2 diabetes mellitus without complications: Secondary | ICD-10-CM

## 2018-05-26 DIAGNOSIS — R000 Tachycardia, unspecified: Secondary | ICD-10-CM | POA: Diagnosis not present

## 2018-05-26 DIAGNOSIS — Z993 Dependence on wheelchair: Secondary | ICD-10-CM

## 2018-05-26 DIAGNOSIS — G546 Phantom limb syndrome with pain: Secondary | ICD-10-CM

## 2018-05-26 DIAGNOSIS — J449 Chronic obstructive pulmonary disease, unspecified: Secondary | ICD-10-CM | POA: Diagnosis not present

## 2018-05-26 DIAGNOSIS — D509 Iron deficiency anemia, unspecified: Secondary | ICD-10-CM | POA: Diagnosis not present

## 2018-05-26 DIAGNOSIS — R5381 Other malaise: Secondary | ICD-10-CM

## 2018-05-26 DIAGNOSIS — E43 Unspecified severe protein-calorie malnutrition: Secondary | ICD-10-CM

## 2018-05-26 DIAGNOSIS — J984 Other disorders of lung: Secondary | ICD-10-CM

## 2018-05-26 DIAGNOSIS — R911 Solitary pulmonary nodule: Secondary | ICD-10-CM

## 2018-05-26 DIAGNOSIS — IMO0001 Reserved for inherently not codable concepts without codable children: Secondary | ICD-10-CM

## 2018-05-26 DIAGNOSIS — K219 Gastro-esophageal reflux disease without esophagitis: Secondary | ICD-10-CM

## 2018-05-26 DIAGNOSIS — I739 Peripheral vascular disease, unspecified: Secondary | ICD-10-CM

## 2018-05-26 DIAGNOSIS — Z09 Encounter for follow-up examination after completed treatment for conditions other than malignant neoplasm: Secondary | ICD-10-CM

## 2018-05-26 DIAGNOSIS — Z720 Tobacco use: Secondary | ICD-10-CM

## 2018-05-26 DIAGNOSIS — G894 Chronic pain syndrome: Secondary | ICD-10-CM

## 2018-05-26 DIAGNOSIS — Z7409 Other reduced mobility: Secondary | ICD-10-CM

## 2018-05-26 MED ORDER — CEFDINIR 300 MG PO CAPS: 300.0000 mg | ORAL_CAPSULE | Freq: Two times a day (BID) | ORAL | 0 refills | Status: AC

## 2018-05-26 MED FILL — CEFDINIR 300 MG CAPSULE: 300 | 10 days supply | Qty: 20 | Fill #0

## 2018-05-26 NOTE — Progress Notes (Signed)
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 especially if he has continued decline in overall  health status or diagnosis of malignancy with poor prognosis of recovery.  7. Lung nodule < 6cm on CT Patient with lung nodule less than 6 cm on CT scan and also recurrent lung infections with concern for malignancy.  Patient is being referred to cardiology for further evaluation and treatment.  Patient declines interventions for smoking cessation.  10. Hypomagnesemia Patient with recurrent issues with low magnesium and had recent magnesium replacement therapy during hospitalization.  Level will be rechecked at today's visit and patient will be notified if additional magnesium supplementation is needed based on these results - Magnesium  11. Hypophosphatemia Patient with low phosphorus levels during hospitalization.  We will recheck phosphorus level and notify patient if replacement therapy is needed - Phosphorus  12. 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  He is to continue the use of Lyrica for phantom limb pain - Ambulatory referral to Pain Clinic  13. Wheelchair dependent; Face to face encounter for mobility examination; impaired functional  mobility, balance, gait and endurance Patient with wheelchair dependent status post right AKA secondary to peripheral vascular disease and patient now with chronic pain and muscle weakness in the left lower extremity status post femur fracture.  Patient with inability to effectively use a manual wheelchair due to his COPD which causes shortness of breath and easy fatigability.  He reports that he does have adequate space in his home for a power wheelchair and that he has a mental capacity to safely operate a motorized mobility vehicle.  14. Chronic pain syndrome Referral to pain clinic for chronic pain in right LE due to phantom pain s/p AKA; chronic neck and low back pain from degenerative disc disease and left leg pain s/p femur fracture - Ambulatory referral to Pain Clinic  15. Diabetes mellitus type 2 in nonobese (HCC) Blood sugars have been reasonably controlled as per Hgb A1c value of 7.3% in March - Glucose (CBG)  16. GERD GI follow-up pending and continue pantoprazole and famotidine  Future Appointments  Date Time Provider [place]  05/30/2018  9:00 AM [name], MD [facility]   An After Visit Summary was printed and given to the patient.  Return in about 4 weeks (around 06/23/2018) for he would like to see Dr. Joya Gaskins ASAP about his breathing as well; 4 week PCP.  Antony Blackbird, MD, Rosalita Chessman

## 2018-05-26 NOTE — Progress Notes (Signed)
Patient stated he can not breath and and he needs to see a lung doctor. Patient stated he also need the scan to check the cyst in him. Per pt he also need to know if he can have referrals. Per pt his pain doctor dropped him and he do not know why. Per pt he called them to get refills for his Oxycodone but they would not refill his medication and he told them if they don't fill his medication due to him being in pain, he might as well start back on street drugs like Heroin and Cocaine and they told him that he's dropped.    Per pt he is needing his Paperwork done for his power  Wheelchair.  Per pt his brother just recently passed away and he was living with him so now he may have to move due to he can not afford to pay the rent.   Per pt he thought he was seeing Dr. Joya Gaskins. Staff informed patient he was seeing Dr. Chapman Fitch and he stated oh okay.   Today CBG was completed in office and it was 141

## 2018-05-27 ENCOUNTER — Telehealth: Payer: Self-pay

## 2018-05-27 LAB — CBC WITH DIFFERENTIAL/PLATELET
Basophils Absolute: 0 x10E3/uL (ref 0.0–0.2)
Basos: 0 %
EOS (ABSOLUTE): 0.2 x10E3/uL (ref 0.0–0.4)
Eos: 2 %
Hematocrit: 40 % (ref 37.5–51.0)
Hemoglobin: 12.6 g/dL — ABNORMAL LOW (ref 13.0–17.7)
Immature Grans (Abs): 0.1 x10E3/uL (ref 0.0–0.1)
Immature Granulocytes: 1 %
Lymphocytes Absolute: 3.9 x10E3/uL — ABNORMAL HIGH (ref 0.7–3.1)
Lymphs: 33 %
MCH: 23.8 pg — ABNORMAL LOW (ref 26.6–33.0)
MCHC: 31.5 g/dL (ref 31.5–35.7)
MCV: 76 fL — ABNORMAL LOW (ref 79–97)
Monocytes Absolute: 0.7 x10E3/uL (ref 0.1–0.9)
Monocytes: 6 %
Neutrophils Absolute: 6.8 x10E3/uL (ref 1.4–7.0)
Neutrophils: 58 %
Platelets: 453 x10E3/uL — ABNORMAL HIGH (ref 150–450)
RBC: 5.29 x10E6/uL (ref 4.14–5.80)
RDW: 20.3 % — ABNORMAL HIGH (ref 11.6–15.4)
WBC: 11.6 x10E3/uL — ABNORMAL HIGH (ref 3.4–10.8)

## 2018-05-27 LAB — COMPREHENSIVE METABOLIC PANEL WITH GFR
ALT: 21 IU/L (ref 0–44)
AST: 18 IU/L (ref 0–40)
Albumin/Globulin Ratio: 1.3 (ref 1.2–2.2)
Albumin: 4.3 g/dL (ref 3.8–4.9)
Alkaline Phosphatase: 104 IU/L (ref 39–117)
BUN/Creatinine Ratio: 15 (ref 9–20)
BUN: 14 mg/dL (ref 6–24)
Bilirubin Total: <0.2 mg/dL (ref 0.0–1.2)
CO2: 22 mmol/L (ref 20–29)
Calcium: 9.8 mg/dL (ref 8.7–10.2)
Chloride: 97 mmol/L (ref 96–106)
Creatinine, Ser: 0.91 mg/dL (ref 0.76–1.27)
GFR calc Af Amer: 106 mL/min/1.73
GFR calc non Af Amer: 92 mL/min/1.73
Globulin, Total: 3.4 g/dL (ref 1.5–4.5)
Glucose: 105 mg/dL — ABNORMAL HIGH (ref 65–99)
Potassium: 4.6 mmol/L (ref 3.5–5.2)
Sodium: 137 mmol/L (ref 134–144)
Total Protein: 7.7 g/dL (ref 6.0–8.5)

## 2018-05-27 LAB — MAGNESIUM: Magnesium: 2 mg/dL (ref 1.6–2.3)

## 2018-05-27 LAB — PHOSPHORUS: Phosphorus: 3.2 mg/dL (ref 2.8–4.1)

## 2018-05-27 NOTE — Telephone Encounter (Signed)
Left message for patient to return my call.  Covid-19 travel screening questions  Have you traveled in the last 14 days? If yes where?  Do you now or have you had a fever in the last 14 days?  Do you have any respiratory symptoms of shortness of breath or cough now or in the last 14 days?  Do you have a medical history of Congestive Heart Failure?  Do you have a medical history of lung disease?  Do you have any family members or close contacts with diagnosed or suspected Covid-19?

## 2018-05-30 ENCOUNTER — Other Ambulatory Visit: Payer: Self-pay

## 2018-05-30 ENCOUNTER — Encounter: Payer: Self-pay | Admitting: Internal Medicine

## 2018-05-30 ENCOUNTER — Ambulatory Visit (INDEPENDENT_AMBULATORY_CARE_PROVIDER_SITE_OTHER): Payer: Medicaid Other | Admitting: Internal Medicine

## 2018-05-30 ENCOUNTER — Other Ambulatory Visit (HOSPITAL_COMMUNITY): Payer: Self-pay

## 2018-05-30 VITALS — Ht 73.0 in | Wt 100.0 lb

## 2018-05-30 DIAGNOSIS — J984 Other disorders of lung: Secondary | ICD-10-CM | POA: Diagnosis not present

## 2018-05-30 DIAGNOSIS — R109 Unspecified abdominal pain: Secondary | ICD-10-CM | POA: Diagnosis not present

## 2018-05-30 DIAGNOSIS — J189 Pneumonia, unspecified organism: Secondary | ICD-10-CM | POA: Diagnosis not present

## 2018-05-30 DIAGNOSIS — G8929 Other chronic pain: Secondary | ICD-10-CM

## 2018-05-30 DIAGNOSIS — R1310 Dysphagia, unspecified: Secondary | ICD-10-CM

## 2018-05-30 DIAGNOSIS — R1319 Other dysphagia: Secondary | ICD-10-CM

## 2018-05-30 NOTE — Assessment & Plan Note (Signed)
Appropriate ? If related to aspiration

## 2018-05-30 NOTE — Assessment & Plan Note (Signed)
Have thought neuropathic in origin.
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
## 2018-05-30 NOTE — Telephone Encounter (Signed)
Spoke with patient and he is in a wheelchair and has no ride here. He tired to download zoom and was unable to do that. Requesting phone visit.
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
## 2018-05-30 NOTE — Patient Instructions (Signed)
Good to talk today [name].  We are going to arrange for you to have a modified barium swallow test and a regular barium swallow with tablet to evaluate your swallowing problems.  We will set that up for next week at the earliest to allow for you to set up transportation.  You have been scheduled for a Barium Esophogram at [facility] (1st floor of the hospital) on 06/08/2018 at 10:30AM. Please arrive 15 minutes prior to your appointment for registration. Make certain not to have anything to eat or drink 3 hours prior to your test. If you need to reschedule for any reason, please contact radiology at [phone] to do so. __________________________________________________________________ A barium swallow is an examination that concentrates on views of the esophagus. This tends to be a double contrast exam (barium and two liquids which, when combined, create a gas to distend the wall of the oesophagus) or single contrast (non-ionic iodine based). The study is usually tailored to your symptoms so a good history is essential. Attention is paid during the study to the form, structure and configuration of the esophagus, looking for functional disorders (such as aspiration, dysphagia, achalasia, motility and reflux) EXAMINATION You may be asked to change into a gown, depending on the type of swallow being performed. A radiologist and radiographer will perform the procedure. The radiologist will advise you of the type of contrast selected for your procedure and direct you during the exam. You will be asked to stand, sit or lie in several different positions and to hold a small amount of fluid in your mouth before being asked to swallow while the imaging is performed .In some instances you may be asked to swallow barium coated marshmallows to assess the motility of a solid food bolus. The exam can be recorded as a digital or video fluoroscopy procedure. POST PROCEDURE It will take 1-2 days for the  barium to pass through your system. To facilitate this, it is important, unless otherwise directed, to increase your fluids for the next 24-48hrs and to resume your normal diet.  This test typically takes about 30 minutes to perform. __________________________________________________________________________________ They will do your modified barium swallow test at 11:30AM after your other test is completed.    I appreciate the opportunity to care for you. [name], MD, [facility]

## 2018-05-30 NOTE — Assessment & Plan Note (Addendum)
He had problems in 2015 as evidenced by the modified barium swallow below though it had some sort of neurologic damage from a stroke at the time.  He had a gastrostomy tube.  He recovered from that.  I am suspicious he has some pharyngeal or pharyngoesophageal problems as well.  He takes Plavix chronically so upper GI endoscopy and possible dilation is at higher risk for him, and with his comorbidities and illness though we certainly can do an EGD a noninvasive approach makes sense.  We will start with a modified barium swallow and regular barium swallow to evaluate and  Go from there.  He is also being referred to pulmonary for potential bronchoscopy due to evaluate for possible neoplasm of the lung causing the pneumonia.  2015 MBS Moderate cervical esophageal phase dysphagia;Severe pharyngeal phase dysphagia;Mild oral phase dysphagia Clinical impression: Pt shows no improvement from previous MBS 12/06/13. His dysphagia continues to be severe and characterized by both sensory and motor deficits that are further exacerbated by anatomical differences (previous C-spine surgery, bony protuberances with the appearance of osteophytes.) Decreased oral and pharyngeal ROM result in reduced elevation and anterior movement of the hyolaryngeal complex, decreased epiglottic deflection, and decreased pharyngeal constriction with residuals noted in the valleculae and pyriform sinuses. Silent penetration during the swallow due to incomplete laryngeal closure is variably followed by further aspiration of residuals. Compensatory strategies and head postures not attempted due to anatomical differences and the amount of pyriform sinus residue collected (concern for further aspiration of residuals). Recommend that pt remain NPO with medications via alternative means. Prognosis for improvement in the short term is guarded. Palliative care is following pt and can assist in pt's desires re: long term nutrition. SLP will consider  trial of lingual and pharyngeal strengthening exercises due to neurological changes.

## 2018-05-31 ENCOUNTER — Other Ambulatory Visit: Payer: Self-pay

## 2018-05-31 ENCOUNTER — Ambulatory Visit: Payer: Medicaid Other | Attending: Critical Care Medicine | Admitting: Critical Care Medicine

## 2018-05-31 ENCOUNTER — Encounter: Payer: Self-pay | Admitting: Critical Care Medicine

## 2018-05-31 VITALS — BP 102/67 | HR 117 | Temp 98.1°F | Ht 73.0 in

## 2018-05-31 DIAGNOSIS — Z888 Allergy status to other drugs, medicaments and biological substances status: Secondary | ICD-10-CM | POA: Diagnosis not present

## 2018-05-31 DIAGNOSIS — Z886 Allergy status to analgesic agent status: Secondary | ICD-10-CM | POA: Diagnosis not present

## 2018-05-31 DIAGNOSIS — D649 Anemia, unspecified: Secondary | ICD-10-CM | POA: Insufficient documentation

## 2018-05-31 DIAGNOSIS — J189 Pneumonia, unspecified organism: Secondary | ICD-10-CM | POA: Insufficient documentation

## 2018-05-31 DIAGNOSIS — G894 Chronic pain syndrome: Secondary | ICD-10-CM | POA: Insufficient documentation

## 2018-05-31 DIAGNOSIS — Z7951 Long term (current) use of inhaled steroids: Secondary | ICD-10-CM | POA: Insufficient documentation

## 2018-05-31 DIAGNOSIS — Z7952 Long term (current) use of systemic steroids: Secondary | ICD-10-CM | POA: Insufficient documentation

## 2018-05-31 DIAGNOSIS — M069 Rheumatoid arthritis, unspecified: Secondary | ICD-10-CM | POA: Insufficient documentation

## 2018-05-31 DIAGNOSIS — E43 Unspecified severe protein-calorie malnutrition: Secondary | ICD-10-CM | POA: Diagnosis not present

## 2018-05-31 DIAGNOSIS — Z9119 Patient's noncompliance with other medical treatment and regimen: Secondary | ICD-10-CM | POA: Insufficient documentation

## 2018-05-31 DIAGNOSIS — G4733 Obstructive sleep apnea (adult) (pediatric): Secondary | ICD-10-CM | POA: Diagnosis not present

## 2018-05-31 DIAGNOSIS — Z89611 Acquired absence of right leg above knee: Secondary | ICD-10-CM | POA: Insufficient documentation

## 2018-05-31 DIAGNOSIS — Z8249 Family history of ischemic heart disease and other diseases of the circulatory system: Secondary | ICD-10-CM | POA: Insufficient documentation

## 2018-05-31 DIAGNOSIS — Z885 Allergy status to narcotic agent status: Secondary | ICD-10-CM | POA: Diagnosis not present

## 2018-05-31 DIAGNOSIS — Z86711 Personal history of pulmonary embolism: Secondary | ICD-10-CM | POA: Diagnosis not present

## 2018-05-31 DIAGNOSIS — Z800 Family history of malignant neoplasm of digestive organs: Secondary | ICD-10-CM | POA: Diagnosis not present

## 2018-05-31 DIAGNOSIS — E1151 Type 2 diabetes mellitus with diabetic peripheral angiopathy without gangrene: Secondary | ICD-10-CM | POA: Insufficient documentation

## 2018-05-31 DIAGNOSIS — Z7982 Long term (current) use of aspirin: Secondary | ICD-10-CM | POA: Diagnosis not present

## 2018-05-31 DIAGNOSIS — F17210 Nicotine dependence, cigarettes, uncomplicated: Secondary | ICD-10-CM | POA: Insufficient documentation

## 2018-05-31 DIAGNOSIS — Z91013 Allergy to seafood: Secondary | ICD-10-CM | POA: Diagnosis not present

## 2018-05-31 DIAGNOSIS — I110 Hypertensive heart disease with heart failure: Secondary | ICD-10-CM | POA: Insufficient documentation

## 2018-05-31 DIAGNOSIS — J984 Other disorders of lung: Secondary | ICD-10-CM

## 2018-05-31 DIAGNOSIS — I509 Heart failure, unspecified: Secondary | ICD-10-CM | POA: Insufficient documentation

## 2018-05-31 DIAGNOSIS — Z7902 Long term (current) use of antithrombotics/antiplatelets: Secondary | ICD-10-CM | POA: Diagnosis not present

## 2018-05-31 DIAGNOSIS — Z881 Allergy status to other antibiotic agents status: Secondary | ICD-10-CM | POA: Insufficient documentation

## 2018-05-31 DIAGNOSIS — Z79899 Other long term (current) drug therapy: Secondary | ICD-10-CM | POA: Insufficient documentation

## 2018-05-31 DIAGNOSIS — K219 Gastro-esophageal reflux disease without esophagitis: Secondary | ICD-10-CM | POA: Insufficient documentation

## 2018-05-31 DIAGNOSIS — J431 Panlobular emphysema: Secondary | ICD-10-CM | POA: Diagnosis not present

## 2018-05-31 DIAGNOSIS — Z720 Tobacco use: Secondary | ICD-10-CM

## 2018-05-31 MED ORDER — PREDNISONE 20 MG PO TABS: ORAL_TABLET | ORAL | 0 refills | Status: AC

## 2018-05-31 MED ORDER — BUDESONIDE-FORMOTEROL FUMARATE 160-4.5 MCG/ACT IN AERO: 2.0000 | INHALATION_SPRAY | Freq: Two times a day (BID) | RESPIRATORY_TRACT | 12 refills | Status: AC

## 2018-05-31 MED FILL — predniSONE 20 MG TABS: 20 | 25 days supply | Qty: 40 | Fill #0

## 2018-05-31 MED FILL — SYMBICORT 160-4.5 MCG INH: 160-4.5 | 30 days supply | Qty: 10 | Fill #0

## 2018-05-31 NOTE — Assessment & Plan Note (Signed)
There is a cavitary process in the left lower lobe with chronic recurring pneumonia and decreased breath sounds to the left lower lobe.  The patient certainly has difficulty with swallowing and may have esophageal abnormalities.  This is under evaluation by gastroenterology  My question is could this patient have an endobronchial lesion with his smoking continuing.  He has lymphadenopathy seen in the mediastinum and hilar areas on CT scanning from April.  I am going to refer the patient to the pulmonary clinic to see if the bronchoscopy can be performed as I no longer have bronchoscopy privileges

## 2018-05-31 NOTE — Progress Notes (Signed)
Per pt his breathing is alright but he just have trouble breathing. Per pt he needs refills on his Prednisone.

## 2018-05-31 NOTE — Patient Instructions (Signed)
A referral to [facility] will be made for a possible airway exam  Refill on prednisone: Take 4 tablets daily for 5 days then one a day and stay  Symbicort 160 two puff twice daily refill sent  Focus on smoking cessation

## 2018-05-31 NOTE — Assessment & Plan Note (Signed)
Chronic obstructive lung disease with bronchitic component and ongoing tobacco use  Plan for this will be for the patient to continue the Symbicort at 2 inhalations twice daily and albuterol as needed.  I will also repulse prednisone at 40 mg daily for 5 days then reduce to 10 mg a day and stay at that level.  The patient will finish his current dose of Omnicef.  The patient is instructed to continue to pursue smoking cessation.

## 2018-06-01 ENCOUNTER — Encounter: Payer: Self-pay | Admitting: Family Medicine

## 2018-06-01 ENCOUNTER — Telehealth: Payer: Self-pay | Admitting: Family

## 2018-06-01 NOTE — Telephone Encounter (Signed)
COVID-19 Pre-Screening Questions: ° °Do you currently have a fever (>100 °F), chills or unexplained body aches? No   ° °Are you currently experiencing new cough, shortness of breath, sore throat, runny nose? No   °•  °Have you recently travelled outside the state of [place] in the last 14 days? no °•  °1. Have you been in contact with someone that is currently pending confirmation of Covid19 testing or has been confirmed to have the Covid19 virus?  No  ° °

## 2018-06-02 ENCOUNTER — Ambulatory Visit: Payer: Medicaid Other | Admitting: Family

## 2018-06-02 ENCOUNTER — Telehealth: Payer: Self-pay

## 2018-06-02 NOTE — Telephone Encounter (Signed)
Order for power chair and required documentation faxed to [employer] - attn : Andria Rhein # [phone]

## 2018-06-04 LAB — ACID FAST CULTURE WITH REFLEXED SENSITIVITIES (MYCOBACTERIA)
Acid Fast Culture: NEGATIVE
Acid Fast Culture: NEGATIVE

## 2018-06-07 ENCOUNTER — Other Ambulatory Visit: Payer: Self-pay | Admitting: Family Medicine

## 2018-06-07 DIAGNOSIS — K219 Gastro-esophageal reflux disease without esophagitis: Secondary | ICD-10-CM

## 2018-06-07 LAB — ACID FAST CULTURE WITH REFLEXED SENSITIVITIES (MYCOBACTERIA): Acid Fast Culture: NEGATIVE

## 2018-06-08 ENCOUNTER — Ambulatory Visit (HOSPITAL_COMMUNITY)
Admission: RE | Admit: 2018-06-08 | Discharge: 2018-06-08 | Disposition: A | Discharge status: Home / Self Care | Payer: Medicaid Other | Source: Ambulatory Visit | Attending: Internal Medicine | Admitting: Internal Medicine

## 2018-06-08 ENCOUNTER — Other Ambulatory Visit: Payer: Self-pay

## 2018-06-08 ENCOUNTER — Telehealth: Payer: Self-pay | Admitting: *Deleted

## 2018-06-08 DIAGNOSIS — R1310 Dysphagia, unspecified: Secondary | ICD-10-CM

## 2018-06-08 DIAGNOSIS — R1319 Other dysphagia: Secondary | ICD-10-CM

## 2018-06-08 NOTE — Progress Notes (Signed)
Sheri,  Please let patient know swallowing problems are related to problems with the throat where it meets the esophagus and airway. The esophagus is not blocked below that. This is likely the cause of his pneumonia and he needs to follow the recommendations from speech path. I do not recommend other testing.  I am ccing PCP and Dr. Joya Gaskins who are seeing him at [facility]. Message for them - While not guaranteed to relieve this problem sometimes a gastrostomy tube is used to reduce the aspiration dependig upon how things go.   I could and am willing to do that but can be more efficiently scheduled in IR typically.

## 2018-06-08 NOTE — Progress Notes (Signed)
See MBS note also

## 2018-06-08 NOTE — Telephone Encounter (Signed)
Patient called stating he's trying to get status on his power wheelchair. Informed patient that office will get back in touch with him once provider returns to office.
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
## 2018-06-09 ENCOUNTER — Telehealth: Payer: Self-pay

## 2018-06-09 ENCOUNTER — Telehealth: Payer: Self-pay | Admitting: Critical Care Medicine

## 2018-06-09 DIAGNOSIS — T17908A Unspecified foreign body in respiratory tract, part unspecified causing other injury, initial encounter: Secondary | ICD-10-CM

## 2018-06-09 DIAGNOSIS — J189 Pneumonia, unspecified organism: Secondary | ICD-10-CM

## 2018-06-09 DIAGNOSIS — R1312 Dysphagia, oropharyngeal phase: Secondary | ICD-10-CM

## 2018-06-09 DIAGNOSIS — T17800A Unspecified foreign body in other parts of respiratory tract causing asphyxiation, initial encounter: Secondary | ICD-10-CM | POA: Insufficient documentation

## 2018-06-09 DIAGNOSIS — I69391 Dysphagia following cerebral infarction: Secondary | ICD-10-CM | POA: Insufficient documentation

## 2018-06-09 NOTE — Telephone Encounter (Signed)
Message left for [facility] inquiring about status of order for power chair. Message left requesting a call back to this CM # [phone].  Call placed to patient to inquire if he has a preference for home health agencies to provide home health ST. He said that he prefers [facility] if possible, second choice would be [facility]. He said that he has not heard anything about his power chair. Informed him that this CM already left a message for the power chair coordinator at [facility] to check on status.

## 2018-06-09 NOTE — Telephone Encounter (Signed)
I spoke to the patient,   He has chronic aspiration.  He does not need a bronchoscopy at this point  I will cancel pulmonary referral for bronch  I will order a speech therapy evaluation in his home, he is basically home bound  Opal Sidles forwarding to you, can you assist with the Home health order I just placed for speech therapy.

## 2018-06-10 ENCOUNTER — Telehealth: Payer: Self-pay

## 2018-06-10 ENCOUNTER — Telehealth: Payer: Self-pay | Admitting: Family Medicine

## 2018-06-10 NOTE — Telephone Encounter (Signed)
Call received from [facility].  She explained that additional information is needed in the providers note. She has sent the request for information to this CM and the information was forwarded to Dr Chapman Fitch. Debbie also noted that they received an order for a prosthesis for the patient and insurance will not usually pay for a prosthesis and a power chair.

## 2018-06-10 NOTE — Telephone Encounter (Signed)
Spoke with Shirlean Mylar who works with [facility] per previous message on file.  Per Shirlean Mylar, patient have an appt with them on Tuesday at 7 am to learn how to put on his prosthetic leg. Per Shirlean Mylar, when she briefly opened patient's chart, she noted that there was an FYI on it that said something about Covid 19. Per Shirlean Mylar, she did know where in the chart to look to see if patient was positive for Covid so she called patient PCP doctor office instead. Staff informed Shirlean Mylar that if she goes back in patient's chart, under the labs tab she should go to 05-14-2018 and their is the results for the Covid 19 test that patient did. Per Shirlean Mylar, she is not currently at her desk right now for her to walk through and look at the results. Staff then proceeded and informed Shirlean Mylar that per patient's records, it looks like patient was negative for Covid 19. Shirlean Mylar then verbalized understanding and stated okay she guess patient can proceed with his appt for Tuesday June 2nd.

## 2018-06-10 NOTE — Telephone Encounter (Signed)
[name], can you find out status of patient's power chair. I left the order for his chair on your desk last week I think

## 2018-06-10 NOTE — Telephone Encounter (Signed)
Jone Baseman called to inform they need to know if the patient has COVID-19 states they saw the [facility] and patient has been calling to schedule an appointment but they will be unable to see him if he is tested positive. Please follow up.

## 2018-06-11 NOTE — Telephone Encounter (Signed)
I received your note about the additional information that is needed and I am guessing that his weight was not recorded at his recent visit due to his inability to safely stand so that his weight could be obtained. I will have to see if there is a weight available from the agency through whom he is trying to get the power chair. Also, can you contact the mobility company and see if they have gone to patient's home to see if his home can accommodate a motorized wheelchair.

## 2018-06-13 ENCOUNTER — Telehealth: Payer: Self-pay | Admitting: Family Medicine

## 2018-06-13 ENCOUNTER — Other Ambulatory Visit: Payer: Self-pay | Admitting: Critical Care Medicine

## 2018-06-13 DIAGNOSIS — T17800A Unspecified foreign body in other parts of respiratory tract causing asphyxiation, initial encounter: Secondary | ICD-10-CM

## 2018-06-13 DIAGNOSIS — R1319 Other dysphagia: Secondary | ICD-10-CM

## 2018-06-13 DIAGNOSIS — R1310 Dysphagia, unspecified: Secondary | ICD-10-CM

## 2018-06-13 DIAGNOSIS — I69391 Dysphagia following cerebral infarction: Secondary | ICD-10-CM

## 2018-06-13 NOTE — Telephone Encounter (Signed)
This CM spoke to patient. He was very anxious stating that he was not sure what Dr Joya Gaskins told him the other day. The patient said he is not sure " why they can't fix my throat' and wanted to know how long he has before he passes away. He said that Dr Joya Gaskins spoke about signing a document, possibly an advance directive. This CM explained what an advance directive is and it does not mean that he is going to pass away.  He said " I have whole in my throat and a hole in my lung" and he would like to know if there is more that he needs to know about his health status.

## 2018-06-13 NOTE — Telephone Encounter (Signed)
Patient called stating Dr.Wright called him and he did not understand what was said to him. Patient states they would like someone to follow up with them to better explain the message. Patient states they are scared and would like to know how much time they have left before they pass. Please follow up.

## 2018-06-13 NOTE — Telephone Encounter (Signed)
He had a study to evaluate his swallowing and when he swallows he aspirates some of the substance that he is swallowing into his lungs which is likely causing his recurrent pneumonia. It was felt that he possibly may not be able to swallow safely and the note suggested that sometimes a feeding tube may be an option to prevent aspiration from occurring.   I will also set up an appointment for him to be seen by speech therapy but if he cannot swallow safely then a feeding tube will need to be discussed. I am not sure if you want to put Mr. Feggins on the schedule for tele-health visit later this week to so that he can discuss his swallowing issues. The visit can be with myself or Dr. Joya Gaskins

## 2018-06-13 NOTE — Telephone Encounter (Signed)
OK, thanks

## 2018-06-14 ENCOUNTER — Encounter (HOSPITAL_COMMUNITY): Payer: Self-pay | Admitting: Emergency Medicine

## 2018-06-14 ENCOUNTER — Emergency Department (HOSPITAL_COMMUNITY): Payer: Medicaid Other

## 2018-06-14 ENCOUNTER — Ambulatory Visit: Payer: Medicaid Other | Attending: Vascular Surgery | Admitting: Physical Therapy

## 2018-06-14 ENCOUNTER — Other Ambulatory Visit: Payer: Self-pay

## 2018-06-14 ENCOUNTER — Emergency Department (HOSPITAL_COMMUNITY)
Admission: EM | Admit: 2018-06-14 | Discharge: 2018-06-14 | Discharge status: Against Medical Advice | Payer: Medicaid Other | Attending: Emergency Medicine | Admitting: Emergency Medicine

## 2018-06-14 DIAGNOSIS — E119 Type 2 diabetes mellitus without complications: Secondary | ICD-10-CM | POA: Diagnosis not present

## 2018-06-14 DIAGNOSIS — R072 Precordial pain: Secondary | ICD-10-CM | POA: Insufficient documentation

## 2018-06-14 DIAGNOSIS — J441 Chronic obstructive pulmonary disease with (acute) exacerbation: Secondary | ICD-10-CM | POA: Diagnosis not present

## 2018-06-14 DIAGNOSIS — F17210 Nicotine dependence, cigarettes, uncomplicated: Secondary | ICD-10-CM | POA: Diagnosis not present

## 2018-06-14 DIAGNOSIS — Z79899 Other long term (current) drug therapy: Secondary | ICD-10-CM | POA: Diagnosis not present

## 2018-06-14 DIAGNOSIS — Z8673 Personal history of transient ischemic attack (TIA), and cerebral infarction without residual deficits: Secondary | ICD-10-CM | POA: Insufficient documentation

## 2018-06-14 DIAGNOSIS — Z20828 Contact with and (suspected) exposure to other viral communicable diseases: Secondary | ICD-10-CM | POA: Insufficient documentation

## 2018-06-14 DIAGNOSIS — J45909 Unspecified asthma, uncomplicated: Secondary | ICD-10-CM | POA: Diagnosis not present

## 2018-06-14 DIAGNOSIS — I509 Heart failure, unspecified: Secondary | ICD-10-CM | POA: Diagnosis not present

## 2018-06-14 DIAGNOSIS — I110 Hypertensive heart disease with heart failure: Secondary | ICD-10-CM | POA: Diagnosis not present

## 2018-06-14 DIAGNOSIS — Z89611 Acquired absence of right leg above knee: Secondary | ICD-10-CM | POA: Diagnosis not present

## 2018-06-14 DIAGNOSIS — Z7902 Long term (current) use of antithrombotics/antiplatelets: Secondary | ICD-10-CM | POA: Diagnosis not present

## 2018-06-14 DIAGNOSIS — R0602 Shortness of breath: Secondary | ICD-10-CM | POA: Diagnosis present

## 2018-06-14 LAB — CBC WITH DIFFERENTIAL/PLATELET
Abs Immature Granulocytes: 0 10*3/uL (ref 0.00–0.07)
Basophils Absolute: 0 10*3/uL (ref 0.0–0.1)
Basophils Relative: 0 %
Eosinophils Absolute: 0 10*3/uL (ref 0.0–0.5)
Eosinophils Relative: 0 %
HCT: 46.1 % (ref 39.0–52.0)
Hemoglobin: 14 g/dL (ref 13.0–17.0)
Lymphocytes Relative: 9 %
Lymphs Abs: 1.4 10*3/uL (ref 0.7–4.0)
MCH: 24.5 pg — ABNORMAL LOW (ref 26.0–34.0)
MCHC: 30.4 g/dL (ref 30.0–36.0)
MCV: 80.6 fL (ref 80.0–100.0)
Monocytes Absolute: 0.5 10*3/uL (ref 0.1–1.0)
Monocytes Relative: 3 %
Neutro Abs: 13.7 10*3/uL — ABNORMAL HIGH (ref 1.7–7.7)
Neutrophils Relative %: 88 %
Platelets: 320 10*3/uL (ref 150–400)
RBC: 5.72 MIL/uL (ref 4.22–5.81)
RDW: 21.2 % — ABNORMAL HIGH (ref 11.5–15.5)
WBC: 15.6 10*3/uL — ABNORMAL HIGH (ref 4.0–10.5)
nRBC: 0 % (ref 0.0–0.2)
nRBC: 1 /100 WBC — ABNORMAL HIGH

## 2018-06-14 LAB — COMPREHENSIVE METABOLIC PANEL
ALT: 19 U/L (ref 0–44)
AST: 19 U/L (ref 15–41)
Albumin: 3.5 g/dL (ref 3.5–5.0)
Alkaline Phosphatase: 80 U/L (ref 38–126)
Anion gap: 13 (ref 5–15)
BUN: 24 mg/dL — ABNORMAL HIGH (ref 6–20)
CO2: 26 mmol/L (ref 22–32)
Calcium: 9.8 mg/dL (ref 8.9–10.3)
Chloride: 97 mmol/L — ABNORMAL LOW (ref 98–111)
Creatinine, Ser: 0.96 mg/dL (ref 0.61–1.24)
GFR calc Af Amer: >60 mL/min (ref >60)
GFR calc non Af Amer: >60 mL/min (ref >60)
Glucose, Bld: 387 mg/dL — ABNORMAL HIGH (ref 70–99)
Potassium: 4.5 mmol/L (ref 3.5–5.1)
Sodium: 136 mmol/L (ref 135–145)
Total Bilirubin: 0.3 mg/dL (ref 0.3–1.2)
Total Protein: 6.9 g/dL (ref 6.5–8.1)

## 2018-06-14 LAB — I-STAT TROPONIN, ED: Troponin i, poc: 0.07 ng/mL (ref 0.00–0.08)

## 2018-06-14 LAB — TROPONIN I: Troponin I: 0.08 ng/mL (ref <0.03)

## 2018-06-14 LAB — SARS CORONAVIRUS 2 BY RT PCR (HOSPITAL ORDER, PERFORMED IN ~~LOC~~ HOSPITAL LAB): SARS Coronavirus 2: NEGATIVE

## 2018-06-14 LAB — CBG MONITORING, ED: Glucose-Capillary: 395 mg/dL — ABNORMAL HIGH (ref 70–99)

## 2018-06-14 MED ORDER — ALBUTEROL SULFATE HFA 108 (90 BASE) MCG/ACT IN AERS
2.0000 | INHALATION_SPRAY | Freq: Once | RESPIRATORY_TRACT | Status: AC
  Administered 2018-06-14: 2 via RESPIRATORY_TRACT
  Filled 2018-06-14: qty 6.7

## 2018-06-14 MED ORDER — SODIUM CHLORIDE 0.9 % IV SOLN
INTRAVENOUS | Status: DC
  Administered 2018-06-14: 10:00:00 via INTRAVENOUS

## 2018-06-14 MED ORDER — METHYLPREDNISOLONE SODIUM SUCC 125 MG IJ SOLR
125.0000 mg | Freq: Once | INTRAMUSCULAR | Status: AC
  Administered 2018-06-14: 10:00:00 125 mg via INTRAVENOUS
  Filled 2018-06-14: qty 2

## 2018-06-14 MED ORDER — INSULIN ASPART 100 UNIT/ML ~~LOC~~ SOLN
10.0000 [IU] | Freq: Once | SUBCUTANEOUS | Status: AC
  Administered 2018-06-14: 12:00:00 10 [IU] via INTRAVENOUS

## 2018-06-14 MED ORDER — PREDNISONE 10 MG PO TABS: 40.0000 mg | ORAL_TABLET | Freq: Every day | ORAL | 0 refills | Status: DC

## 2018-06-14 MED FILL — predniSONE 10 MG TABS: 10 | 5 days supply | Qty: 20 | Fill #0

## 2018-06-14 NOTE — ED Notes (Signed)
Pt stated he wanted to go home and refused troponin test. Stated nothing is wrong with his heart. MD made aware. Pt left AMA.

## 2018-06-14 NOTE — Telephone Encounter (Signed)
Patient called stating his throat hurts now and would like for someone to FU with him. Attempted to schedule appt with patient however patient wanted to speak with someone

## 2018-06-14 NOTE — Telephone Encounter (Signed)
Patients call taken.  Patient identified by name and date of birth.  Patient states that he has a sore throat and it is difficult for him to swallow.  Patient had just left the Emergency Department AMA because they were not looking at his throat.  Patient entered the ED for chest pain and shortness of breath.  Patient scared he is going to die from sore throat.  Patient able to speak in full sentences but does sound hoarse.  Patient stated he needed help and was advised to go back to the Emergency Room if he felt he couldn't breath.  Patient denied chest pain or shortness of breath.    Patient advised to drink hot tea made of lemon and honey.  Appointment made with Dr. Chapman Fitch tomorrow morning.    Patient acknowledged understanding of advice.

## 2018-06-14 NOTE — ED Triage Notes (Signed)
Pt arrives via EMS from home with reports of SOB and CP for 12 hours, worsened over time. Center chest tightness. 324 ASA given. No nitro given to unable to obtain IV access.

## 2018-06-14 NOTE — Telephone Encounter (Signed)
Reroute
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
## 2018-06-15 ENCOUNTER — Ambulatory Visit: Payer: Medicaid Other | Admitting: Family Medicine

## 2018-06-15 NOTE — Telephone Encounter (Signed)
Team,  Sitting in the Active Orders queue are my two orders for Speech therapy   One is the face to face order to justify home health and the other is the actual speech therapy order ,,  I just placed another order

## 2018-06-15 NOTE — Telephone Encounter (Signed)
According to Opal Sidles we have to have face to face documentation so as a home health referral.

## 2018-06-15 NOTE — Telephone Encounter (Signed)
Alinda Sierras, do you know that status of speech therapy referral that was made by Dr. Joya Gaskins for Mr. Ayars?

## 2018-06-15 NOTE — Telephone Encounter (Signed)
[name] was supposed to come into clinic today for an appointment but had to re-schedule due to transportation issue but will come in tomorrow. What do I need to do to obtain speech study for patient?

## 2018-06-15 NOTE — Telephone Encounter (Signed)
I used an order workflow from my [facility] days.  Can you have dr full re enter [place]. Perhaps it is an invalid order

## 2018-06-15 NOTE — Telephone Encounter (Signed)
I had made a referral to a speech pathology evaluation.  Do we know where that stands?

## 2018-06-15 NOTE — Telephone Encounter (Signed)
Good Morning  I don't see a referral for  Speech therapy from Dr Joya Gaskins

## 2018-06-16 ENCOUNTER — Telehealth: Payer: Self-pay

## 2018-06-16 ENCOUNTER — Ambulatory Visit (INDEPENDENT_AMBULATORY_CARE_PROVIDER_SITE_OTHER): Payer: Medicaid Other | Admitting: Internal Medicine

## 2018-06-16 ENCOUNTER — Other Ambulatory Visit: Payer: Self-pay | Admitting: Pharmacist

## 2018-06-16 ENCOUNTER — Ambulatory Visit: Payer: Medicaid Other | Attending: Family Medicine | Admitting: Family Medicine

## 2018-06-16 ENCOUNTER — Encounter: Payer: Self-pay | Admitting: Family Medicine

## 2018-06-16 ENCOUNTER — Other Ambulatory Visit: Payer: Self-pay

## 2018-06-16 ENCOUNTER — Encounter: Payer: Self-pay | Admitting: Internal Medicine

## 2018-06-16 VITALS — BP 124/76 | HR 105 | Temp 97.6°F

## 2018-06-16 VITALS — BP 132/86 | HR 86 | Temp 98.4°F

## 2018-06-16 DIAGNOSIS — J189 Pneumonia, unspecified organism: Secondary | ICD-10-CM

## 2018-06-16 DIAGNOSIS — K219 Gastro-esophageal reflux disease without esophagitis: Secondary | ICD-10-CM

## 2018-06-16 DIAGNOSIS — B370 Candidal stomatitis: Secondary | ICD-10-CM | POA: Diagnosis not present

## 2018-06-16 DIAGNOSIS — B3781 Candidal esophagitis: Secondary | ICD-10-CM

## 2018-06-16 DIAGNOSIS — J984 Other disorders of lung: Secondary | ICD-10-CM | POA: Diagnosis not present

## 2018-06-16 DIAGNOSIS — J690 Pneumonitis due to inhalation of food and vomit: Secondary | ICD-10-CM

## 2018-06-16 DIAGNOSIS — E1165 Type 2 diabetes mellitus with hyperglycemia: Secondary | ICD-10-CM | POA: Diagnosis not present

## 2018-06-16 DIAGNOSIS — R1310 Dysphagia, unspecified: Secondary | ICD-10-CM | POA: Diagnosis not present

## 2018-06-16 DIAGNOSIS — J439 Emphysema, unspecified: Secondary | ICD-10-CM

## 2018-06-16 DIAGNOSIS — J028 Acute pharyngitis due to other specified organisms: Secondary | ICD-10-CM

## 2018-06-16 LAB — GLUCOSE, POCT (MANUAL RESULT ENTRY): POC Glucose: 329 mg/dL — AB (ref 70–99)

## 2018-06-16 IMAGING — CT CT ABD-PELV W/ CM
2 of 5 series · 15 of 46 positions shown, 17 images · IV contrast (ISOVUE 300)
Comparison: 06/23/2014

CLINICAL DATA: Lower abdominal pain for 6 months. Worse on the
right. History of abdominal aortic atherosclerosis. Hepatitis-C.
COPD. Gastroesophageal reflux disease.

EXAM:
CT ABDOMEN AND PELVIS WITH CONTRAST
TECHNIQUE: Multidetector CT imaging of the abdomen and pelvis was performed
using the standard protocol following bolus administration of
intravenous contrast.
CONTRAST:  100mL VSAUVT-WCC IOPAMIDOL (VSAUVT-WCC) INJECTION 61%

[Series 2: abd/pel w · axial · 0.76mm/px · z∈[-487,-62]mm · 12 of 95 slices shown, 14 images]
[im 5/95  soft-tissue]
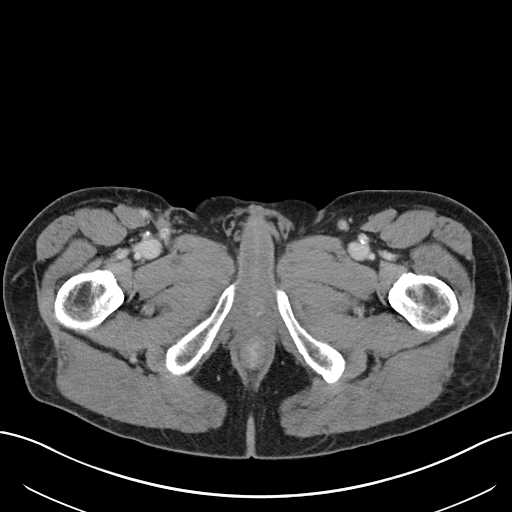
[im 5/95  bone]
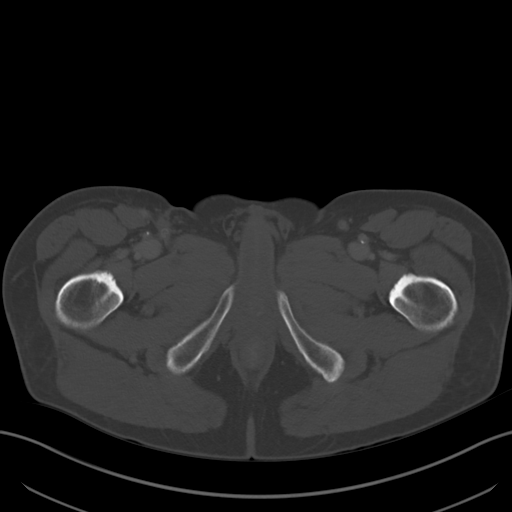
[im 15/95  soft-tissue]
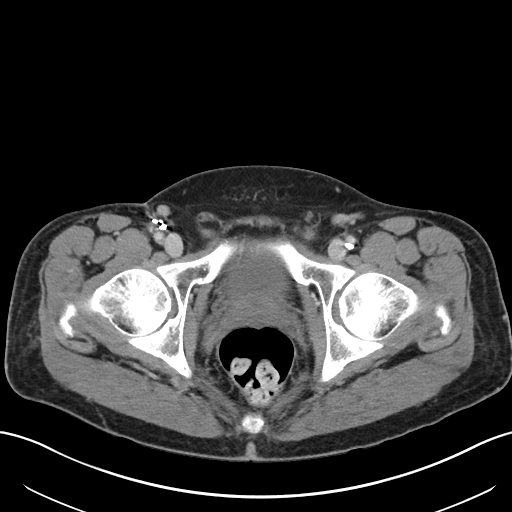
[im 19/95  soft-tissue]
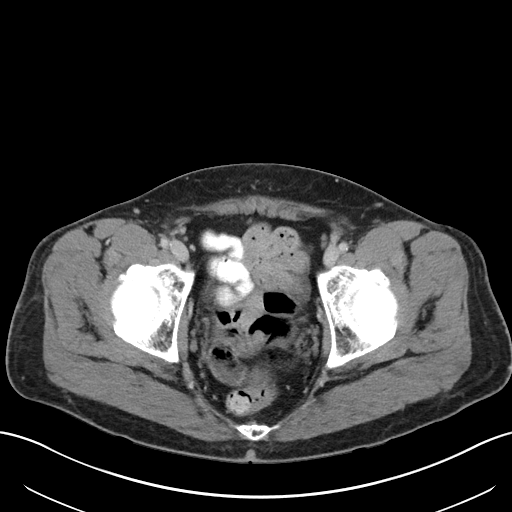
[im 29/95  soft-tissue]
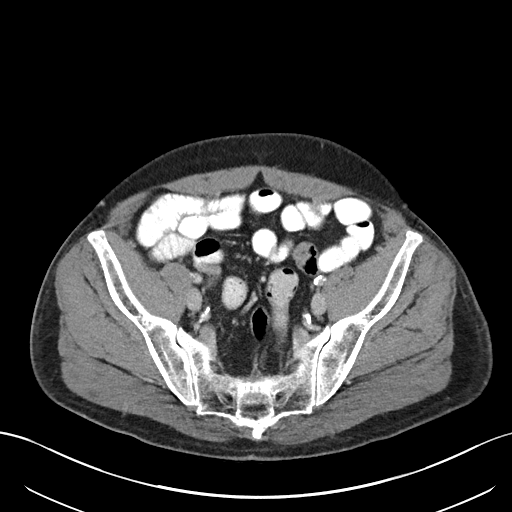
[im 38/95  soft-tissue]
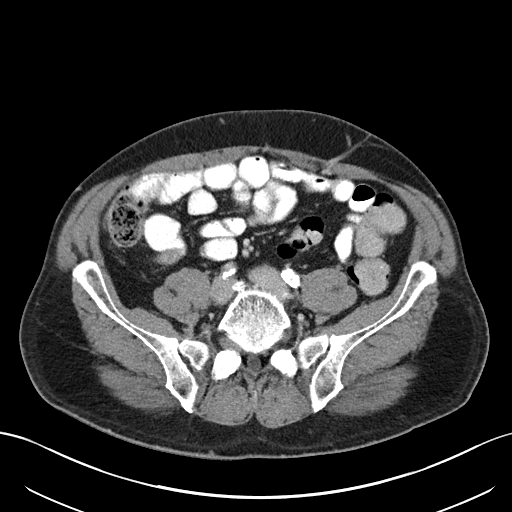
[im 43/95  soft-tissue]
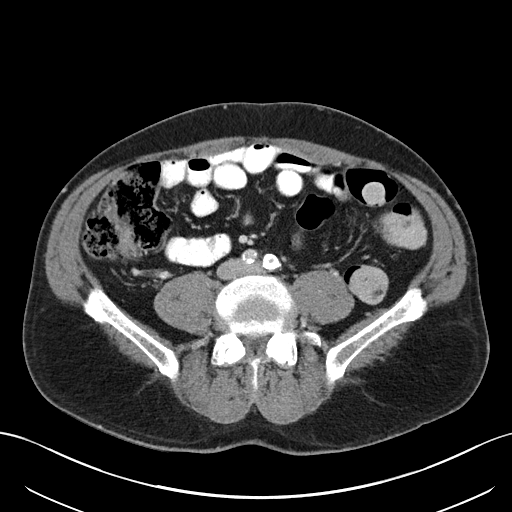
[im 52/95  soft-tissue]
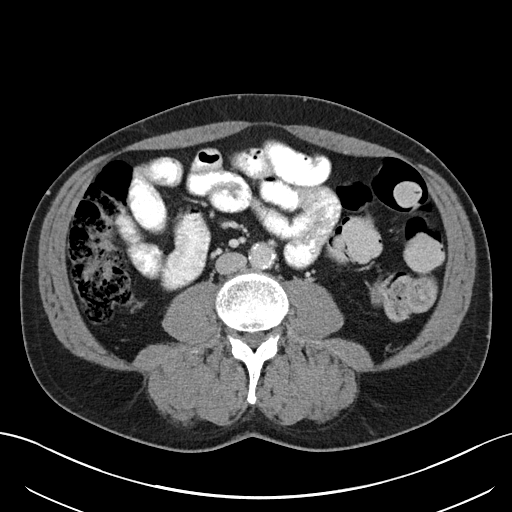
[im 57/95  soft-tissue]
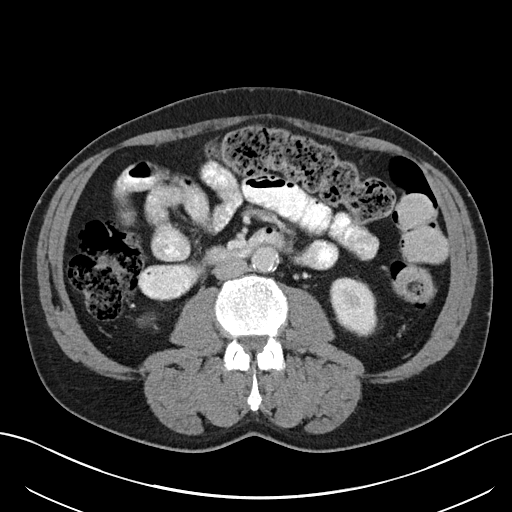
[im 66/95  soft-tissue]
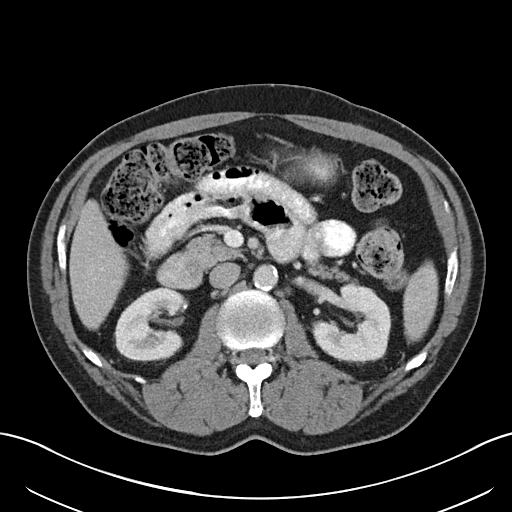
[im 66/95  bone]
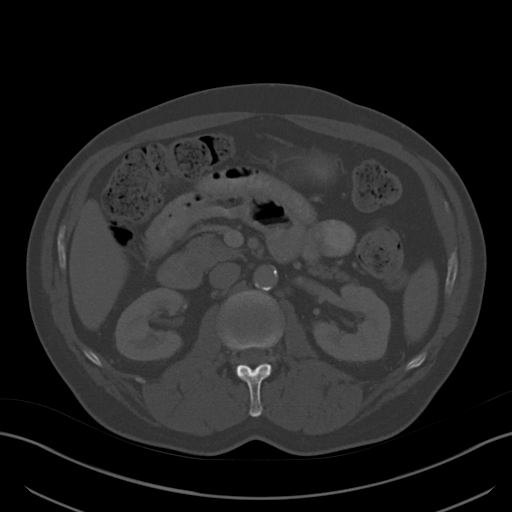
[im 76/95  soft-tissue]
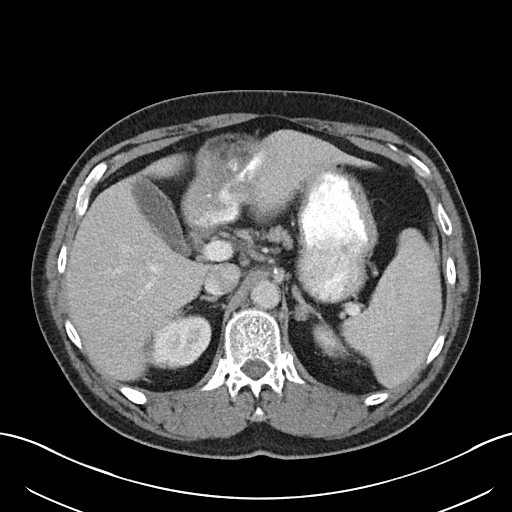
[im 80/95  soft-tissue]
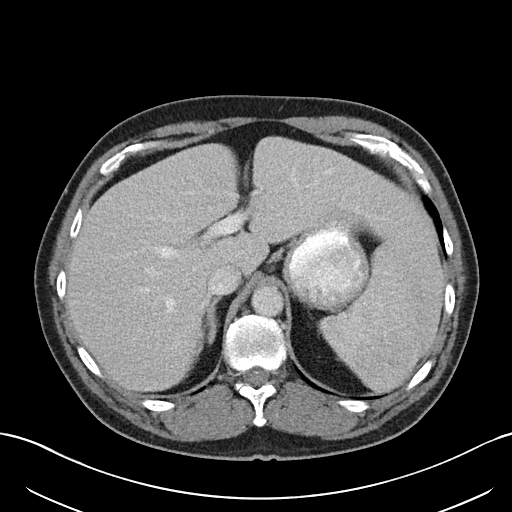
[im 90/95  soft-tissue]
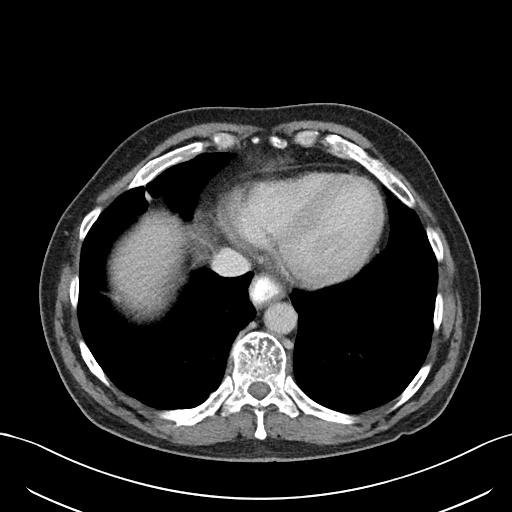

[Series 6: abd/pel w st · coronal · 0.73mm/px · 3 of 84 slices shown]
[im 28/84  soft-tissue]
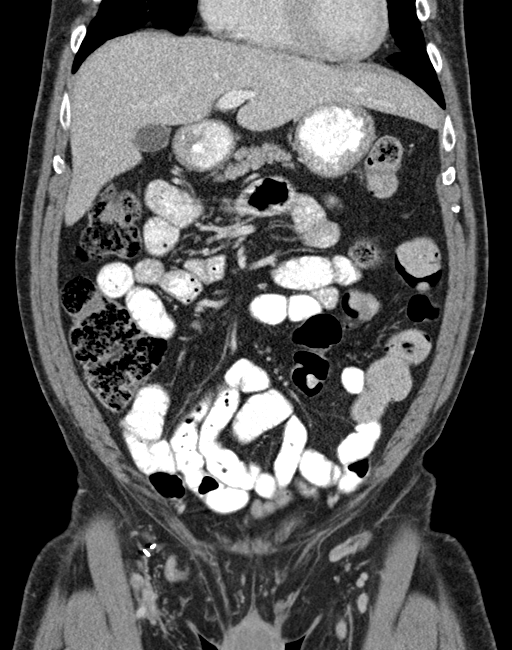
[im 37/84  soft-tissue]
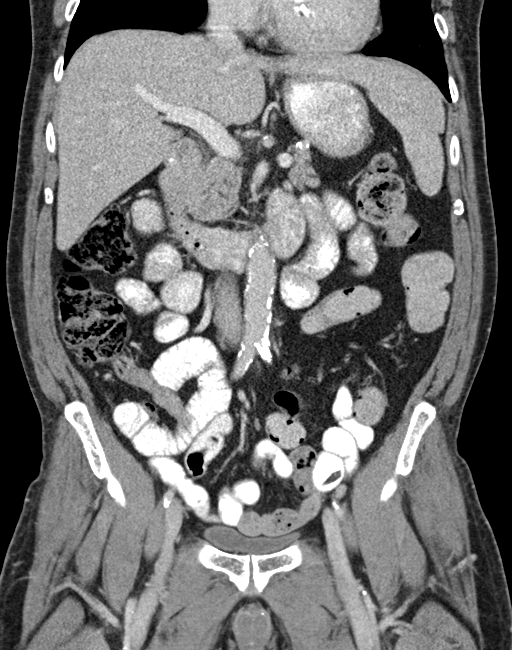
[im 47/84  soft-tissue]
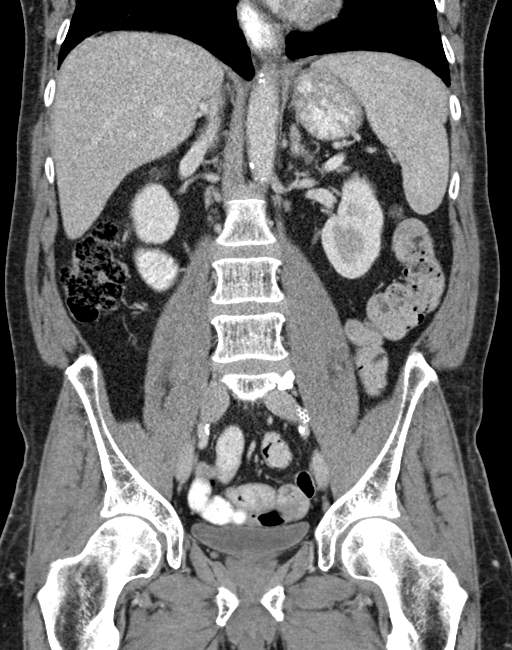

[15 of 46 positions shown; findings below may reference images not displayed]

FINDINGS: Lower chest: Centrilobular emphysema. Right middle lobe scarring.
Mucous plugging involving left lower lobe bronchi, chronic. Normal
heart size without pericardial or pleural effusion. Dilated lower
esophagus with contrast within.

Hepatobiliary: Subtle lateral segment left liver lobe and caudate
lobe enlargement. Medial segment left liver lobe atrophy. No focal
liver lesion. Normal gallbladder, without biliary ductal dilatation.

Pancreas: Normal, without mass or ductal dilatation.

Spleen: Normal in size, without focal abnormality.

Adrenals/Urinary Tract: Thickening and mild nodularity of the right
greater than left adrenal glands, similar. Bilateral renal vascular
calcifications, without obstructive uropathy. Normal ureters and
urinary bladder.

Stomach/Bowel: Normal stomach, without wall thickening. Colonic
stool burden suggests constipation. Normal terminal ileum. Normal
small bowel.

Vascular/Lymphatic: Advanced aortic and branch vessel
atherosclerosis. No evidence of portal venous hypertension. Patent
portal and splenic veins. Left common iliac artery stent. Surgical
changes in the groins. Dilatation of the right common femoral artery
at 1.7 cm on image 87/series 2. This is minimally increased compared
to 5711 where it measures 1.6 cm. No retroperitoneal or retrocrural
adenopathy. Prominent right inguinal nodes are likely reactive and
are not significantly changed.

Reproductive: Mild prostatomegaly.

Other: No significant free fluid. Tiny fat containing periumbilical
hernia.

Musculoskeletal: Remote lower right rib fractures. Disc bulge at
L4-5 with mild loss of intervertebral disc height at L5-S1.
IMPRESSION: 1.  Possible constipation.
2. No other explanation for abdominal pain.
3. Findings which are highly suspicious for mild cirrhosis,
especially given the clinical history of hepatitis-C.
4. Dilated, contrast filled lower esophagus suggests esophageal
dysmotility and/or gastroesophageal reflux.
5. Advanced aortic atherosclerosis for age.
6. Surgical changes in both groins with slight increase in right
common femoral artery dilatation.

## 2018-06-16 MED ORDER — PREDNISONE 10 MG PO TABS: 40.0000 mg | ORAL_TABLET | Freq: Every day | ORAL | 0 refills | Status: AC

## 2018-06-16 MED ORDER — ACCU-CHEK AVIVA DEVI: 0 refills | Status: AC

## 2018-06-16 MED ORDER — INSULIN GLARGINE 100 UNIT/ML SOLOSTAR PEN: 10.0000 [IU] | PEN_INJECTOR | Freq: Every day | SUBCUTANEOUS | 3 refills | Status: AC

## 2018-06-16 MED ORDER — NYSTATIN 100000 UNIT/ML MT SUSP: 5.0000 mL | Freq: Four times a day (QID) | OROMUCOSAL | 2 refills | Status: AC

## 2018-06-16 MED ORDER — FAMOTIDINE 20 MG PO TABS: 20.0000 mg | ORAL_TABLET | Freq: Two times a day (BID) | ORAL | 3 refills | Status: AC

## 2018-06-16 MED ORDER — INSULIN PEN NEEDLE 32G X 4 MM MISC: 11 refills | Status: AC

## 2018-06-16 MED ORDER — FLUCONAZOLE 100 MG PO TABS: 100.0000 mg | ORAL_TABLET | Freq: Every day | ORAL | 0 refills | Status: AC

## 2018-06-16 MED FILL — ACCU-CHEK GUIDE MONITOR SYS: W/DEVICE | 30 days supply | Qty: 1 | Fill #0

## 2018-06-16 MED FILL — LANTUS SOLOSTAR 100 UNITS/M: 100 | 30 days supply | Qty: 15 | Fill #0

## 2018-06-16 MED FILL — NYSTATIN 100,000 UNITS/ML S: 100000 | 3 days supply | Qty: 60 | Fill #0

## 2018-06-16 MED FILL — FLUCONAZOLE 100 MG TABLET: 100 | 7 days supply | Qty: 7 | Fill #0

## 2018-06-16 NOTE — Telephone Encounter (Signed)
Order for home ST faxed to [facility]

## 2018-06-16 NOTE — Progress Notes (Signed)
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 suspension; Take 5 mLs (500,000 Units total) by mouth 4 (four) times daily. X 10 days; swish and swallow  Dispense: 60 mL; Refill: 2 - fluconazole (DIFLUCAN) 100 MG tablet; Take 1 tablet (100 mg total) by mouth daily for 7 days.  Dispense: 7 tablet; Refill: 0  4. Type 2 diabetes mellitus with hyperglycemia, without long-term current use of insulin ([place]) Patient reported that earlier this week his home blood sugar had been around 400.  Patient was previously a diet-controlled diabetic.  Patient has been on prednisone due to his breathing issues and patient also with the use of an inhaled corticosteroid.  Patient's blood sugar was checked and patient was able to be contacted before he left the clinic and will start Lantus Solostar 10 units daily and new prescription sent in for increase in blood sugar testing supplies to 3 times daily.  Home health referral as patient does have some cognitive issues which may be related to chronic illnesses/history of substance abuse and home health will need to give patient teaching and hopefully can also help with monitoring initially.  Patient is aware that he should call the office if his blood sugars are remaining greater than 200s  after starting the Lantus he will follow-up in the next 1 to 2 weeks.  Patient unfortunately would not likely be able to adequately and correctly use a sliding scale of regular insulin but he may need to have a simplified sliding regular insulin scale especially if he will be on daily prednisone after completion of recent increased dose which he was prescribed in the emergency department - Glucose (CBG) - Insulin Glargine (LANTUS SOLOSTAR) 100 UNIT/ML Solostar Pen; Inject 10 Units into the skin daily.  Dispense: 5 pen; Refill: 3 - Blood Glucose Monitoring Suppl (ACCU-CHEK AVIVA) device; Use as instructed to check blood sugar three times per day-fasting in morning, midday and before bedtime  Dispense: 1 each; Refill: 0 - Ambulatory referral to Home Health  5. Gastroesophageal reflux disease, esophagitis presence not specified Patient will continue the use of Protonix.  Patient was previously on ranitidine but this medication has been recalled.  Patient will start Pepcid 20 mg twice daily in place of ranitidine - famotidine (PEPCID) 20 MG tablet;  Take 1 tablet (20 mg total) by mouth 2 (two) times daily.  Dispense: 60 tablet; Refill: 3  6. Pulmonary emphysema, unspecified emphysema type [facility]) Patient with emphysema/COPD and is status post emergency room visit on 06/14/2018 due to exacerbation.  He reports that he needs additional prednisone as he did not receive the prednisone prescribed after his ED visit.  I discussed with the patient that the steroids will cause increased blood sugars and he is encouraged to monitor his blood sugars, remain hydrated with water and start use of once daily Lantus.  Patient will follow-up in the next 1 to 2 weeks as well with his pulmonologist.  Complete smoking cessation encouraged if he has not already done so. - predniSONE (DELTASONE) 10 MG tablet; Take 4 tablets (40 mg total) by mouth daily.  Dispense: 20 tablet; Refill: 0  An After Visit Summary was printed and given  to the patient.  Allergies as of 06/16/2018      Reactions   Buprenorphine Hcl-naloxone Hcl Shortness Of Breath, Other (See Comments)   "Felt like I was going to die," was  jittery, had trouble breathing, felt hot (reaction to Suboxone) PER THE [place]CCS[place] database, the patient received #42 Suboxone films between 03/26/17 and 04/02/17 from [facility]...(??)   Ciprofloxacin Anaphylaxis   Morphine-naltrexone Other (See Comments)   Seizures   Shellfish-derived Products Anaphylaxis, Hives, Swelling   Benadryl [diphenhydramine Hcl] Hives, Itching, Rash   Fish Allergy Hives, Swelling, Rash   Morphine And Related Other (See Comments)   Seizures   Benadryl [diphenhydramine]    Ciprofloxacin    Fish Allergy    Morphine And Related    Shellfish Allergy    Suboxone [buprenorphine Hcl-naloxone Hcl]    Vancomycin    Vicodin [hydrocodone-acetaminophen]    Vancomycin Rash   Vicodin [hydrocodone-acetaminophen] Nausea Only      Medication List       Accurate as of June 16, 2018 11:59 PM. If you have any questions, ask your nurse or doctor.        STOP taking these medications   ranitidine 150 MG tablet Commonly known as:  ZANTAC Stopped by:  Antony Blackbird, MD     TAKE these medications   Accu-Chek Aviva device Use as instructed to check blood sugar three times per day-fasting in morning, midday and before bedtime What changed:  additional instructions Changed by:  Antony Blackbird, MD   accu-chek soft touch lancets Use as instructed once per day when checking blood sugar   acetaminophen 325 MG tablet Commonly known as:  TYLENOL Take 2 tablets (650 mg total) by mouth every 6 (six) hours as needed for mild pain (or Fever >/= 101).   albuterol (2.5 MG/3ML) 0.083% nebulizer solution Commonly known as:  PROVENTIL Take 3 mLs (2.5 mg total) by nebulization every 6 (six) hours as needed for wheezing or shortness of breath.   ProAir HFA 108 (90 Base) MCG/ACT inhaler Generic drug:   albuterol INHALE 2 PUFFS BY MOUTH EVERY 4 HOURS AS NEEDED   budesonide-formoterol 160-4.5 MCG/ACT inhaler Commonly known as:  Symbicort Inhale 2 puffs into the lungs 2 (two) times daily.   clopidogrel 75 MG tablet Commonly known as:  PLAVIX TAKE 1 TABLET BY MOUTH ONCE DAILY. What changed:  when to take this   famotidine 20 MG tablet Commonly known as:  Pepcid Take 1 tablet (20 mg total) by mouth 2 (two) times daily. Started by:  Antony Blackbird, MD   feeding supplement (ENSURE ENLIVE) Liqd Take  237 mLs by mouth 2 (two) times daily between meals.   fluconazole 100 MG tablet Commonly known as:  Diflucan Take 1 tablet (100 mg total) by mouth daily for 7 days. Started by:  Antony Blackbird, MD   fluticasone 50 MCG/ACT nasal spray Commonly known as:  FLONASE Place 2 sprays into both nostrils daily.   glucose blood test strip Commonly known as:  Accu-Chek Aviva Use as instructed once daily to check blood sugar   hydrOXYzine 25 MG capsule Commonly known as:  VISTARIL TAKE (1) CAPSULE BY MOUTH TWICE DAILY.   Insulin Glargine 100 UNIT/ML Solostar Pen Commonly known as:  Lantus SoloStar Inject 10 Units into the skin daily. Started by:  Antony Blackbird, MD   Insulin Pen Needle 32G X 4 MM Misc Commonly known as:  TRUEplus Pen Needles Use to inject insulin pen daily. Started by:  Tresa Endo, RPH-CPP   nicotine 21 mg/24hr patch Commonly known as:  NICODERM CQ - dosed in mg/24 hours Place 1 patch (21 mg total) onto the skin daily.   nystatin 100000 UNIT/ML suspension Commonly known as:  MYCOSTATIN Take 5 mLs (500,000 Units total) by mouth 4 (four) times daily. X 10 days; swish and swallow Started by:  Antony Blackbird, MD   oxyCODONE-acetaminophen 5-325 MG tablet Commonly known as:  PERCOCET/ROXICET Take 2 tablets by mouth every 4 (four) hours as needed for severe pain.   pantoprazole 20 MG tablet Commonly known as:  PROTONIX TAKE 1 TABLET BY MOUTH IN THE MORNING.    predniSONE 20 MG tablet Commonly known as:  DELTASONE Take 4 tablets daily for 5 days then  One a day and stay   predniSONE 10 MG tablet Commonly known as:  DELTASONE Take 4 tablets (40 mg total) by mouth daily.   pregabalin 75 MG capsule Commonly known as:  Lyrica TAKE 1 CAPSULE BY MOUTH THREE TIMES A DAY What changed:    how much to take  how to take this  when to take this  additional instructions       Follow-up: Return in about 2 weeks (around 06/30/2018) for COPD/dysphagia-myself or Dr. Joya Gaskins.   Antony Blackbird, MD

## 2018-06-16 NOTE — Progress Notes (Signed)
RFV: hospital follow up  Patient ID: [name], male   DOB: 06-19-1960, 58 y.o.   MRN: [id number]  HPI 83yoM with hx of necrotizing pneumonia admitted in April, ruled out for mtb and covid, treated with unasyn plus linezolid. In clinic for follow up. Since we last saw him in clinic right aka, left leg fracture,now wheelchair bound. 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 He was recently at ED, and diagnosed with oral thrush -given 100mg  fluconazole daily. He also reports difficulty swallowing.   His brother recently passed away Outpatient Encounter Medications as of 06/16/2018  Medication Sig  . acetaminophen (TYLENOL) 325 MG tablet Take 2 tablets (650 mg total) by mouth every 6 (six) hours as needed for mild pain (or Fever >/= 101).  Marland Kitchen albuterol (PROVENTIL) (2.5 MG/3ML) 0.083% nebulizer solution Take 3 mLs (2.5 mg total) by nebulization every 6 (six) hours as needed for wheezing or shortness of breath.  . Blood Glucose Monitoring Suppl (ACCU-CHEK AVIVA) device Use as instructed to check blood sugar three times per day-fasting in morning, midday and before bedtime  . budesonide-formoterol (SYMBICORT) 160-4.5 MCG/ACT inhaler Inhale 2 puffs into the lungs 2 (two) times daily.  . clopidogrel (PLAVIX) 75 MG tablet TAKE 1 TABLET BY MOUTH ONCE DAILY. (Patient taking differently: Take 75 mg by mouth once. )  . famotidine (PEPCID) 20 MG tablet Take 1 tablet (20 mg total) by mouth 2 (two) times daily.  . feeding supplement, ENSURE ENLIVE, (ENSURE ENLIVE) LIQD Take 237 mLs by mouth 2 (two) times daily between meals.  . fluconazole (DIFLUCAN) 100 MG tablet Take 1 tablet (100 mg total) by mouth daily for 7 days.  . fluticasone (FLONASE) 50 MCG/ACT nasal spray Place 2 sprays into both nostrils daily.  Marland Kitchen glucose blood (ACCU-CHEK AVIVA) test strip Use as instructed once daily to check blood sugar  . hydrOXYzine (VISTARIL) 25 MG capsule TAKE (1) CAPSULE BY MOUTH  TWICE DAILY.  Marland Kitchen Insulin Glargine (LANTUS SOLOSTAR) 100 UNIT/ML Solostar Pen Inject 10 Units into the skin daily.  . Lancets (ACCU-CHEK SOFT TOUCH) lancets Use as instructed once per day when checking blood sugar  . nicotine (NICODERM CQ - DOSED IN MG/24 HOURS) 21 mg/24hr patch Place 1 patch (21 mg total) onto the skin daily.  Marland Kitchen nystatin (MYCOSTATIN) 100000 UNIT/ML suspension Take 5 mLs (500,000 Units total) by mouth 4 (four) times daily. X 10 days; swish and swallow  . oxyCODONE-acetaminophen (PERCOCET/ROXICET) 5-325 MG tablet Take 2 tablets by mouth every 4 (four) hours as needed for severe pain.  . pantoprazole (PROTONIX) 20 MG tablet TAKE 1 TABLET BY MOUTH IN THE MORNING.  . predniSONE (DELTASONE) 10 MG tablet Take 4 tablets (40 mg total) by mouth daily.  . predniSONE (DELTASONE) 20 MG tablet Take 4 tablets daily for 5 days then  One a day and stay  . pregabalin (LYRICA) 75 MG capsule TAKE 1 CAPSULE BY MOUTH THREE TIMES A DAY (Patient taking differently: Take 75 mg by mouth 3 (three) times daily. )  . PROAIR HFA 108 (90 Base) MCG/ACT inhaler INHALE 2 PUFFS BY MOUTH EVERY 4 HOURS AS NEEDED  . [DISCONTINUED] Blood Glucose Monitoring Suppl (ACCU-CHEK AVIVA) device Use as instructed to check blood sugar once per day  . [DISCONTINUED] topiramate (TOPAMAX) 25 MG tablet Take 25 mg by mouth 2 (two) times daily.     No facility-administered encounter medications on file as of 06/16/2018.      Patient Active Problem  List   Diagnosis Date Noted  . Aspiration into lower respiratory tract 06/09/2018  . Dysphagia as late effect of cerebrovascular accident (CVA) 06/09/2018  . Underweight   . Pressure injury of skin 04/21/2018  . Cavitary pneumonia - LLL 04/20/2018  . Hyperlipidemia 04/19/2018  . Hypertension 04/19/2018  . PVD (peripheral vascular disease) ([place]) 01/21/2018  . Diabetes mellitus type 2 in nonobese ([place]) 01/21/2018  . Stroke ([place]) 10/09/2017  . Substance abuse ([place])   . History of  completed stroke 10/08/2017  . Microcytic anemia 10/08/2017  . IV drug abuse ([place]) 10/08/2017  . History of heroin use   . 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 Polysubstance abuse ([place]) 03/27/2014  . Lung nodule < 6cm on CT 01/18/2014  . History of pulmonary embolism 01/02/2014  . Esophageal dysphagia   . Brain abscess, Hx of   . Protein-calorie malnutrition, severe ([place]) 12/01/2013  . Abnormality of gait 04/16/2012  . Tobacco abuse 11/04/2011  . Carotid stenosis, bilateral 08/21/2011  . Chronic abdominal pain-  08/07/2011  . Cervical post-laminectomy syndrome 07/06/2011  . Chronic pain in left foot 02/20/2011  . Depression 09/25/2010  . Gastroesophageal reflux disease 07/25/2010  . Anxiety state 08/28/2008  . SLEEP APNEA, OBSTRUCTIVE, MODERATE 04/06/2008  . HERNIATED LUMBAR DISK WITH RADICULOPATHY 04/06/2008  . Dyslipidemia 02/10/2008  . ERECTILE DYSFUNCTION 02/10/2008  . COPD (chronic obstructive pulmonary disease) ([place]) 02/10/2008     Health Maintenance Due  Topic Date Due  . COLONOSCOPY  09/06/2008  . URINE MICROALBUMIN  05/30/2010  . FOOT EXAM  01/19/2015  . OPHTHALMOLOGY EXAM  03/13/2015  . COLON CANCER SCREENING ANNUAL FOBT  07/02/2015     Review of Systems Review of Systems  Constitutional: Negative for fever, chills, diaphoresis, activity change, appetite change, fatigue and unexpected weight change.  HENT: Negative for congestion, sore throat, rhinorrhea, sneezing, trouble swallowing and sinus pressure.  Eyes: Negative for photophobia and visual disturbance.  Respiratory: +wheezing, difficulty swallowing, Negative for cough, chest tightness, shortness of breath and stridor.  Cardiovascular: Negative for chest pain, palpitations and leg swelling.  Gastrointestinal: Negative for nausea, vomiting, abdominal pain, diarrhea, constipation, blood in stool, abdominal distention and anal bleeding.  Genitourinary: Negative for dysuria, hematuria, flank pain and  difficulty urinating.  Musculoskeletal: Negative for myalgias, back pain, joint swelling, arthralgias and gait problem.  Skin: Negative for color change, pallor, rash and wound.  Neurological: Negative for dizziness, tremors, weakness and light-headedness.  Hematological: Negative for adenopathy. Does not bruise/bleed easily.  Psychiatric/Behavioral: Negative for behavioral problems, confusion, sleep disturbance, dysphoric mood, decreased concentration and agitation.    Physical Exam   BP 124/76   Pulse (!) 105   Temp 97.6 F (36.4 C) (Oral)  Physical Exam  Constitutional: He is oriented to person, place, and time. He appears well-developed and well-nourished. No distress.  HENT:  Mouth/Throat: Oropharynx is clear and moist. +posterior pharynx exudate Cardiovascular: Normal rate, regular rhythm and normal heart sounds. Exam reveals no gallop and no friction rub.  No murmur heard.  Pulmonary/Chest: Effort normal and breath sounds normal. No respiratory distress. He has no wheezes.  Lymphadenopathy:  He has no cervical adenopathy.  Ext: right leg aka. Left leg scarring Neurological: He is alert and oriented to person, place, and time.  Skin: Skin is warm and dry. No rash noted. No erythema.  Psychiatric: He has a normal mood and affect. His behavior is normal.    Lab Results  Component Value Date   HIV1RNAQUANT <20 09/13/2013  Lab Results  Component Value Date   HEPBSAB POS (A) 09/13/2013   No results found for: RPR, LABRPR  CBC Lab Results  Component Value Date   WBC 15.6 (H) 06/14/2018   RBC 5.72 06/14/2018   HGB 14.0 06/14/2018   HCT 46.1 06/14/2018   PLT 320 06/14/2018   MCV 80.6 06/14/2018   MCH 24.5 (L) 06/14/2018   MCHC 30.4 06/14/2018   RDW 21.2 (H) 06/14/2018   LYMPHSABS 1.4 06/14/2018   MONOABS 0.5 06/14/2018   EOSABS 0.0 06/14/2018    BMET Lab Results  Component Value Date   NA 136 06/14/2018   K 4.5 06/14/2018   CL 97 (L) 06/14/2018   CO2 26  06/14/2018   GLUCOSE 387 (H) 06/14/2018   BUN 24 (H) 06/14/2018   CREATININE 0.96 06/14/2018   CALCIUM 9.8 06/14/2018   GFRNONAA >60 06/14/2018   GFRAA >60 06/14/2018      Assessment and Plan  Oral thrush/eso candidiasis = Will treat with 400mg  fluc x 14d.   Emphysema = continue with inhalers  Grief counseling = offered counseling since he has lost his brother

## 2018-06-16 NOTE — Telephone Encounter (Signed)
Thank you :)

## 2018-06-16 NOTE — Progress Notes (Signed)
Per pt he wants to know if what he have is reversible and how long he have to live and to talk about his power wheelchair. Per pt his therapy for his prosthetic leg is Monday.

## 2018-06-16 NOTE — Telephone Encounter (Signed)
Spoke to the patient when he came to the clinic this afternoon.  Informed him that Dr Chapman Fitch is working on documentation for power chair.  Also informed him that the referral for ST was sent to [place]

## 2018-06-17 MED FILL — TRUEPLUS PEN NDL 32GX5/32: 32G X 4 MM | 30 days supply | Qty: 100 | Fill #0

## 2018-06-18 MED FILL — predniSONE 10 MG TABS: 10 | 5 days supply | Qty: 20 | Fill #0

## 2018-06-20 ENCOUNTER — Encounter: Payer: Self-pay | Admitting: Physical Therapy

## 2018-06-20 ENCOUNTER — Other Ambulatory Visit: Payer: Self-pay

## 2018-06-20 ENCOUNTER — Ambulatory Visit: Payer: Medicaid Other | Admitting: Physical Therapy

## 2018-06-20 DIAGNOSIS — M79605 Pain in left leg: Secondary | ICD-10-CM | POA: Diagnosis present

## 2018-06-20 DIAGNOSIS — M25652 Stiffness of left hip, not elsewhere classified: Secondary | ICD-10-CM | POA: Insufficient documentation

## 2018-06-20 DIAGNOSIS — R2689 Other abnormalities of gait and mobility: Secondary | ICD-10-CM | POA: Insufficient documentation

## 2018-06-20 DIAGNOSIS — M25651 Stiffness of right hip, not elsewhere classified: Secondary | ICD-10-CM | POA: Diagnosis present

## 2018-06-20 DIAGNOSIS — M6281 Muscle weakness (generalized): Secondary | ICD-10-CM

## 2018-06-20 DIAGNOSIS — R296 Repeated falls: Secondary | ICD-10-CM

## 2018-06-20 DIAGNOSIS — R2681 Unsteadiness on feet: Secondary | ICD-10-CM | POA: Insufficient documentation

## 2018-06-20 DIAGNOSIS — R293 Abnormal posture: Secondary | ICD-10-CM
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
## 2018-06-21 ENCOUNTER — Other Ambulatory Visit: Payer: Self-pay

## 2018-06-21 ENCOUNTER — Telehealth: Payer: Self-pay

## 2018-06-21 ENCOUNTER — Ambulatory Visit (HOSPITAL_BASED_OUTPATIENT_CLINIC_OR_DEPARTMENT_OTHER): Payer: Medicaid Other | Admitting: Pharmacist

## 2018-06-21 DIAGNOSIS — I69391 Dysphagia following cerebral infarction: Secondary | ICD-10-CM

## 2018-06-21 DIAGNOSIS — Z79899 Other long term (current) drug therapy: Secondary | ICD-10-CM | POA: Diagnosis not present

## 2018-06-21 DIAGNOSIS — Z5181 Encounter for therapeutic drug level monitoring: Secondary | ICD-10-CM | POA: Insufficient documentation

## 2018-06-21 DIAGNOSIS — Z794 Long term (current) use of insulin: Secondary | ICD-10-CM | POA: Insufficient documentation

## 2018-06-21 DIAGNOSIS — E1165 Type 2 diabetes mellitus with hyperglycemia: Secondary | ICD-10-CM

## 2018-06-21 MED ORDER — ACCU-CHEK FASTCLIX LANCETS MISC: 12 refills | Status: AC

## 2018-06-21 MED ORDER — GLUCOSE BLOOD VI STRP: ORAL_STRIP | 12 refills | Status: AC

## 2018-06-21 MED FILL — ACCU-CHEK FASTCLIX LANCETS: 30 days supply | Qty: 102 | Fill #0

## 2018-06-21 MED FILL — ACCU-CHEK GUIDE TEST STRIP: 30 days supply | Qty: 100 | Fill #0

## 2018-06-21 NOTE — Telephone Encounter (Signed)
Message received from [place]. She stated that the patient was referred to [facility] for PT by a different provider and he just started PT yesterday - 06/20/2018.  Insurance will not cover  home and outpatient therapies at the same time.   Call placed to [facility], spoke to [place] and confirmed that he had a MBS done.  Home ST was recommended but explained to her that patient is not eligible for home ST. She requested that the provider send a referral through Epic for ST.  Call placed to patient. He said that he was told by [employer] that he is not eligible for home ST and the reason why.  He is agreeable to referral for ST at neurorehab if provider is in agreement.

## 2018-06-21 NOTE — Telephone Encounter (Signed)
Call placed to [place] to confirm acceptance of referral for home ST. Spoke to [place] who stated that the patient was referred to outpatient therapy. She stated that she would inquire who placed that order and contact this CM

## 2018-06-21 NOTE — Telephone Encounter (Signed)
I am agreeable for outpt speech therapy.  Order placed

## 2018-06-22 ENCOUNTER — Other Ambulatory Visit: Payer: Self-pay

## 2018-06-22 ENCOUNTER — Emergency Department (HOSPITAL_COMMUNITY): Payer: Medicaid Other

## 2018-06-22 ENCOUNTER — Inpatient Hospital Stay (HOSPITAL_COMMUNITY)
Admission: EM | Admit: 2018-06-22 | Discharge: 2018-07-13 | DRG: 064 | Disposition: E | Payer: Medicaid Other | Attending: Emergency Medicine | Admitting: Emergency Medicine

## 2018-06-22 ENCOUNTER — Observation Stay (HOSPITAL_COMMUNITY): Payer: Medicaid Other

## 2018-06-22 ENCOUNTER — Encounter: Payer: Self-pay | Admitting: Pharmacist

## 2018-06-22 DIAGNOSIS — I6350 Cerebral infarction due to unspecified occlusion or stenosis of unspecified cerebral artery: Secondary | ICD-10-CM | POA: Diagnosis not present

## 2018-06-22 DIAGNOSIS — I110 Hypertensive heart disease with heart failure: Secondary | ICD-10-CM | POA: Diagnosis present

## 2018-06-22 DIAGNOSIS — Z978 Presence of other specified devices: Secondary | ICD-10-CM | POA: Diagnosis not present

## 2018-06-22 DIAGNOSIS — R4781 Slurred speech: Secondary | ICD-10-CM

## 2018-06-22 DIAGNOSIS — E785 Hyperlipidemia, unspecified: Secondary | ICD-10-CM | POA: Diagnosis present

## 2018-06-22 DIAGNOSIS — R0600 Dyspnea, unspecified: Secondary | ICD-10-CM

## 2018-06-22 DIAGNOSIS — F1010 Alcohol abuse, uncomplicated: Secondary | ICD-10-CM | POA: Diagnosis present

## 2018-06-22 DIAGNOSIS — J440 Chronic obstructive pulmonary disease with acute lower respiratory infection: Secondary | ICD-10-CM | POA: Diagnosis present

## 2018-06-22 DIAGNOSIS — Z01818 Encounter for other preprocedural examination: Secondary | ICD-10-CM | POA: Diagnosis not present

## 2018-06-22 DIAGNOSIS — Z681 Body mass index (BMI) 19 or less, adult: Secondary | ICD-10-CM | POA: Diagnosis not present

## 2018-06-22 DIAGNOSIS — Z7289 Other problems related to lifestyle: Secondary | ICD-10-CM

## 2018-06-22 DIAGNOSIS — G9340 Encephalopathy, unspecified: Secondary | ICD-10-CM | POA: Diagnosis present

## 2018-06-22 DIAGNOSIS — R414 Neurologic neglect syndrome: Secondary | ICD-10-CM | POA: Diagnosis present

## 2018-06-22 DIAGNOSIS — E1165 Type 2 diabetes mellitus with hyperglycemia: Secondary | ICD-10-CM | POA: Diagnosis present

## 2018-06-22 DIAGNOSIS — I4891 Unspecified atrial fibrillation: Secondary | ICD-10-CM | POA: Diagnosis not present

## 2018-06-22 DIAGNOSIS — Z66 Do not resuscitate: Secondary | ICD-10-CM | POA: Diagnosis not present

## 2018-06-22 DIAGNOSIS — R64 Cachexia: Secondary | ICD-10-CM | POA: Diagnosis present

## 2018-06-22 DIAGNOSIS — Z87898 Personal history of other specified conditions: Secondary | ICD-10-CM | POA: Diagnosis not present

## 2018-06-22 DIAGNOSIS — I63511 Cerebral infarction due to unspecified occlusion or stenosis of right middle cerebral artery: Secondary | ICD-10-CM | POA: Diagnosis present

## 2018-06-22 DIAGNOSIS — Z91013 Allergy to seafood: Secondary | ICD-10-CM

## 2018-06-22 DIAGNOSIS — A419 Sepsis, unspecified organism: Principal | ICD-10-CM | POA: Diagnosis present

## 2018-06-22 DIAGNOSIS — Z7189 Other specified counseling: Secondary | ICD-10-CM | POA: Diagnosis not present

## 2018-06-22 DIAGNOSIS — J9620 Acute and chronic respiratory failure, unspecified whether with hypoxia or hypercapnia: Secondary | ICD-10-CM | POA: Diagnosis not present

## 2018-06-22 DIAGNOSIS — Z86711 Personal history of pulmonary embolism: Secondary | ICD-10-CM

## 2018-06-22 DIAGNOSIS — Z8673 Personal history of transient ischemic attack (TIA), and cerebral infarction without residual deficits: Secondary | ICD-10-CM | POA: Diagnosis not present

## 2018-06-22 DIAGNOSIS — Z8669 Personal history of other diseases of the nervous system and sense organs: Secondary | ICD-10-CM

## 2018-06-22 DIAGNOSIS — I639 Cerebral infarction, unspecified: Secondary | ICD-10-CM | POA: Diagnosis present

## 2018-06-22 DIAGNOSIS — L89152 Pressure ulcer of sacral region, stage 2: Secondary | ICD-10-CM | POA: Diagnosis present

## 2018-06-22 DIAGNOSIS — R509 Fever, unspecified: Secondary | ICD-10-CM | POA: Diagnosis not present

## 2018-06-22 DIAGNOSIS — R471 Dysarthria and anarthria: Secondary | ICD-10-CM | POA: Diagnosis present

## 2018-06-22 DIAGNOSIS — I6529 Occlusion and stenosis of unspecified carotid artery: Secondary | ICD-10-CM | POA: Diagnosis not present

## 2018-06-22 DIAGNOSIS — Z7951 Long term (current) use of inhaled steroids: Secondary | ICD-10-CM

## 2018-06-22 DIAGNOSIS — Z993 Dependence on wheelchair: Secondary | ICD-10-CM

## 2018-06-22 DIAGNOSIS — Z881 Allergy status to other antibiotic agents status: Secondary | ICD-10-CM

## 2018-06-22 DIAGNOSIS — R4182 Altered mental status, unspecified: Secondary | ICD-10-CM | POA: Diagnosis present

## 2018-06-22 DIAGNOSIS — F1410 Cocaine abuse, uncomplicated: Secondary | ICD-10-CM | POA: Diagnosis not present

## 2018-06-22 DIAGNOSIS — G8194 Hemiplegia, unspecified affecting left nondominant side: Secondary | ICD-10-CM | POA: Diagnosis present

## 2018-06-22 DIAGNOSIS — J9601 Acute respiratory failure with hypoxia: Secondary | ICD-10-CM | POA: Diagnosis not present

## 2018-06-22 DIAGNOSIS — E1151 Type 2 diabetes mellitus with diabetic peripheral angiopathy without gangrene: Secondary | ICD-10-CM | POA: Diagnosis present

## 2018-06-22 DIAGNOSIS — Z7952 Long term (current) use of systemic steroids: Secondary | ICD-10-CM

## 2018-06-22 DIAGNOSIS — R29810 Facial weakness: Secondary | ICD-10-CM | POA: Diagnosis not present

## 2018-06-22 DIAGNOSIS — R0609 Other forms of dyspnea: Secondary | ICD-10-CM | POA: Diagnosis not present

## 2018-06-22 DIAGNOSIS — D72829 Elevated white blood cell count, unspecified: Secondary | ICD-10-CM | POA: Diagnosis not present

## 2018-06-22 DIAGNOSIS — R1310 Dysphagia, unspecified: Secondary | ICD-10-CM | POA: Diagnosis present

## 2018-06-22 DIAGNOSIS — R4701 Aphasia: Secondary | ICD-10-CM | POA: Diagnosis present

## 2018-06-22 DIAGNOSIS — Z794 Long term (current) use of insulin: Secondary | ICD-10-CM

## 2018-06-22 DIAGNOSIS — Z79891 Long term (current) use of opiate analgesic: Secondary | ICD-10-CM

## 2018-06-22 DIAGNOSIS — G936 Cerebral edema: Secondary | ICD-10-CM | POA: Diagnosis present

## 2018-06-22 DIAGNOSIS — Z8661 Personal history of infections of the central nervous system: Secondary | ICD-10-CM

## 2018-06-22 DIAGNOSIS — B1920 Unspecified viral hepatitis C without hepatic coma: Secondary | ICD-10-CM

## 2018-06-22 DIAGNOSIS — Z8619 Personal history of other infectious and parasitic diseases: Secondary | ICD-10-CM

## 2018-06-22 DIAGNOSIS — Z8701 Personal history of pneumonia (recurrent): Secondary | ICD-10-CM

## 2018-06-22 DIAGNOSIS — Z9911 Dependence on respirator [ventilator] status: Secondary | ICD-10-CM

## 2018-06-22 DIAGNOSIS — F410 Panic disorder [episodic paroxysmal anxiety] without agoraphobia: Secondary | ICD-10-CM | POA: Diagnosis present

## 2018-06-22 DIAGNOSIS — Z89611 Acquired absence of right leg above knee: Secondary | ICD-10-CM

## 2018-06-22 DIAGNOSIS — Z8614 Personal history of Methicillin resistant Staphylococcus aureus infection: Secondary | ICD-10-CM

## 2018-06-22 DIAGNOSIS — Z20828 Contact with and (suspected) exposure to other viral communicable diseases: Secondary | ICD-10-CM | POA: Diagnosis present

## 2018-06-22 DIAGNOSIS — L89899 Pressure ulcer of other site, unspecified stage: Secondary | ICD-10-CM | POA: Diagnosis present

## 2018-06-22 DIAGNOSIS — I6523 Occlusion and stenosis of bilateral carotid arteries: Secondary | ICD-10-CM | POA: Diagnosis not present

## 2018-06-22 DIAGNOSIS — G894 Chronic pain syndrome: Secondary | ICD-10-CM | POA: Diagnosis present

## 2018-06-22 DIAGNOSIS — Z7902 Long term (current) use of antithrombotics/antiplatelets: Secondary | ICD-10-CM

## 2018-06-22 DIAGNOSIS — M069 Rheumatoid arthritis, unspecified: Secondary | ICD-10-CM | POA: Diagnosis present

## 2018-06-22 DIAGNOSIS — Z515 Encounter for palliative care: Secondary | ICD-10-CM | POA: Diagnosis not present

## 2018-06-22 DIAGNOSIS — I5032 Chronic diastolic (congestive) heart failure: Secondary | ICD-10-CM | POA: Diagnosis present

## 2018-06-22 DIAGNOSIS — K219 Gastro-esophageal reflux disease without esophagitis: Secondary | ICD-10-CM | POA: Diagnosis present

## 2018-06-22 DIAGNOSIS — F17210 Nicotine dependence, cigarettes, uncomplicated: Secondary | ICD-10-CM | POA: Diagnosis present

## 2018-06-22 DIAGNOSIS — Z95820 Peripheral vascular angioplasty status with implants and grafts: Secondary | ICD-10-CM

## 2018-06-22 DIAGNOSIS — J9600 Acute respiratory failure, unspecified whether with hypoxia or hypercapnia: Secondary | ICD-10-CM | POA: Diagnosis not present

## 2018-06-22 DIAGNOSIS — G4733 Obstructive sleep apnea (adult) (pediatric): Secondary | ICD-10-CM | POA: Diagnosis present

## 2018-06-22 DIAGNOSIS — Z7982 Long term (current) use of aspirin: Secondary | ICD-10-CM

## 2018-06-22 DIAGNOSIS — R29711 NIHSS score 11: Secondary | ICD-10-CM | POA: Diagnosis present

## 2018-06-22 DIAGNOSIS — J209 Acute bronchitis, unspecified: Secondary | ICD-10-CM | POA: Diagnosis not present

## 2018-06-22 DIAGNOSIS — Z8249 Family history of ischemic heart disease and other diseases of the circulatory system: Secondary | ICD-10-CM

## 2018-06-22 DIAGNOSIS — Z8611 Personal history of tuberculosis: Secondary | ICD-10-CM

## 2018-06-22 DIAGNOSIS — Z888 Allergy status to other drugs, medicaments and biological substances status: Secondary | ICD-10-CM

## 2018-06-22 DIAGNOSIS — Z9119 Patient's noncompliance with other medical treatment and regimen: Secondary | ICD-10-CM

## 2018-06-22 DIAGNOSIS — I69391 Dysphagia following cerebral infarction: Secondary | ICD-10-CM

## 2018-06-22 DIAGNOSIS — Z79899 Other long term (current) drug therapy: Secondary | ICD-10-CM

## 2018-06-22 DIAGNOSIS — F1190 Opioid use, unspecified, uncomplicated: Secondary | ICD-10-CM

## 2018-06-22 DIAGNOSIS — E1169 Type 2 diabetes mellitus with other specified complication: Secondary | ICD-10-CM | POA: Diagnosis not present

## 2018-06-22 DIAGNOSIS — Z885 Allergy status to narcotic agent status: Secondary | ICD-10-CM

## 2018-06-22 LAB — COMPREHENSIVE METABOLIC PANEL
ALT: 22 U/L (ref 0–44)
AST: 16 U/L (ref 15–41)
Albumin: 3 g/dL — ABNORMAL LOW (ref 3.5–5.0)
Alkaline Phosphatase: 109 U/L (ref 38–126)
Anion gap: 12 (ref 5–15)
BUN: 17 mg/dL (ref 6–20)
CO2: 27 mmol/L (ref 22–32)
Calcium: 9.4 mg/dL (ref 8.9–10.3)
Chloride: 99 mmol/L (ref 98–111)
Creatinine, Ser: 0.71 mg/dL (ref 0.61–1.24)
GFR calc Af Amer: >60 mL/min (ref >60)
GFR calc non Af Amer: >60 mL/min (ref >60)
Glucose, Bld: 227 mg/dL — ABNORMAL HIGH (ref 70–99)
Potassium: 4.3 mmol/L (ref 3.5–5.1)
Sodium: 138 mmol/L (ref 135–145)
Total Bilirubin: 0.3 mg/dL (ref 0.3–1.2)
Total Protein: 6.7 g/dL (ref 6.5–8.1)

## 2018-06-22 LAB — POCT I-STAT EG7
Acid-Base Excess: 7 mmol/L — ABNORMAL HIGH (ref 0.0–2.0)
Bicarbonate: 32.6 mmol/L — ABNORMAL HIGH (ref 20.0–28.0)
Calcium, Ion: 1.23 mmol/L (ref 1.15–1.40)
HCT: 49 % (ref 39.0–52.0)
Hemoglobin: 16.7 g/dL (ref 13.0–17.0)
O2 Saturation: 91 %
Potassium: 4 mmol/L (ref 3.5–5.1)
Sodium: 137 mmol/L (ref 135–145)
TCO2: 34 mmol/L — ABNORMAL HIGH (ref 22–32)
pCO2, Ven: 46 mmHg (ref 44.0–60.0)
pH, Ven: 7.459 — ABNORMAL HIGH (ref 7.250–7.430)
pO2, Ven: 59 mmHg — ABNORMAL HIGH (ref 32.0–45.0)

## 2018-06-22 LAB — AMMONIA: Ammonia: 45 umol/L — ABNORMAL HIGH (ref 9–35)

## 2018-06-22 LAB — CBC WITH DIFFERENTIAL/PLATELET
Abs Immature Granulocytes: 0.21 10*3/uL — ABNORMAL HIGH (ref 0.00–0.07)
Basophils Absolute: 0 10*3/uL (ref 0.0–0.1)
Basophils Relative: 0 %
Eosinophils Absolute: 0.1 10*3/uL (ref 0.0–0.5)
Eosinophils Relative: 1 %
HCT: 49.6 % (ref 39.0–52.0)
Hemoglobin: 15.3 g/dL (ref 13.0–17.0)
Immature Granulocytes: 1 %
Lymphocytes Relative: 18 %
Lymphs Abs: 3.2 10*3/uL (ref 0.7–4.0)
MCH: 24.1 pg — ABNORMAL LOW (ref 26.0–34.0)
MCHC: 30.8 g/dL (ref 30.0–36.0)
MCV: 78 fL — ABNORMAL LOW (ref 80.0–100.0)
Monocytes Absolute: 0.9 10*3/uL (ref 0.1–1.0)
Monocytes Relative: 5 %
Neutro Abs: 13.5 10*3/uL — ABNORMAL HIGH (ref 1.7–7.7)
Neutrophils Relative %: 75 %
Platelets: 294 10*3/uL (ref 150–400)
RBC: 6.36 MIL/uL — ABNORMAL HIGH (ref 4.22–5.81)
RDW: 20.9 % — ABNORMAL HIGH (ref 11.5–15.5)
WBC: 17.9 10*3/uL — ABNORMAL HIGH (ref 4.0–10.5)
nRBC: 0 % (ref 0.0–0.2)

## 2018-06-22 LAB — URINALYSIS, ROUTINE W REFLEX MICROSCOPIC
Bacteria, UA: NONE SEEN
Bilirubin Urine: NEGATIVE
Glucose, UA: >=500 mg/dL — AB
Hgb urine dipstick: NEGATIVE
Ketones, ur: NEGATIVE mg/dL
Leukocytes,Ua: NEGATIVE
Nitrite: NEGATIVE
Protein, ur: >=300 mg/dL — AB
Specific Gravity, Urine: 1.029 (ref 1.005–1.030)
pH: 6 (ref 5.0–8.0)

## 2018-06-22 LAB — RAPID URINE DRUG SCREEN, HOSP PERFORMED
Amphetamines: NOT DETECTED
Barbiturates: NOT DETECTED
Benzodiazepines: NOT DETECTED
Cocaine: POSITIVE — AB
Opiates: POSITIVE — AB
Tetrahydrocannabinol: NOT DETECTED

## 2018-06-22 LAB — LACTIC ACID, PLASMA
Lactic Acid, Venous: 2.6 mmol/L (ref 0.5–1.9)
Lactic Acid, Venous: 1.4 mmol/L (ref 0.5–1.9)

## 2018-06-22 LAB — ETHANOL: Alcohol, Ethyl (B): <10 mg/dL (ref <10)

## 2018-06-22 LAB — SALICYLATE LEVEL: Salicylate Lvl: <7 mg/dL (ref 2.8–30.0)

## 2018-06-22 LAB — CK: Total CK: 52 U/L (ref 49–397)

## 2018-06-22 LAB — CBG MONITORING, ED: Glucose-Capillary: 238 mg/dL — ABNORMAL HIGH (ref 70–99)

## 2018-06-22 LAB — ACETAMINOPHEN LEVEL: Acetaminophen (Tylenol), Serum: <10 ug/mL — ABNORMAL LOW (ref 10–30)

## 2018-06-22 MED ORDER — LORAZEPAM 2 MG/ML IJ SOLN
2.0000 mg | Freq: Once | INTRAMUSCULAR | Status: AC
  Administered 2018-06-22: 2 mg via INTRAVENOUS

## 2018-06-22 MED ORDER — ASPIRIN 300 MG RE SUPP
300.0000 mg | Freq: Once | RECTAL | Status: AC
  Administered 2018-06-22: 300 mg via RECTAL
  Filled 2018-06-22: qty 1

## 2018-06-22 MED ORDER — LORAZEPAM 2 MG/ML IJ SOLN
0.0000 mg | Freq: Four times a day (QID) | INTRAMUSCULAR | Status: DC
  Administered 2018-06-22: 2 mg via INTRAVENOUS
  Filled 2018-06-22: qty 1

## 2018-06-22 MED ORDER — HEPARIN SODIUM (PORCINE) 5000 UNIT/ML IJ SOLN: 5000.0000 [IU] | Freq: Three times a day (TID) | INTRAMUSCULAR | Status: DC

## 2018-06-22 MED ORDER — LORAZEPAM 2 MG/ML IJ SOLN
INTRAMUSCULAR | Status: AC
  Administered 2018-06-22: 2 mg via INTRAVENOUS
  Filled 2018-06-22: qty 1

## 2018-06-22 MED ORDER — PROMETHAZINE HCL 25 MG PO TABS: 12.5000 mg | ORAL_TABLET | Freq: Four times a day (QID) | ORAL | Status: DC | PRN

## 2018-06-22 MED ORDER — LORAZEPAM 2 MG/ML IJ SOLN
2.0000 mg | INTRAMUSCULAR | Status: DC | PRN
  Administered 2018-06-22 (×2): 2 mg via INTRAVENOUS
  Filled 2018-06-22 (×2): qty 1

## 2018-06-22 MED ORDER — PANTOPRAZOLE SODIUM 20 MG PO TBEC: 20.0000 mg | DELAYED_RELEASE_TABLET | Freq: Every morning | ORAL | Status: DC

## 2018-06-22 MED ORDER — ALBUTEROL SULFATE (2.5 MG/3ML) 0.083% IN NEBU
2.5000 mg | INHALATION_SOLUTION | Freq: Four times a day (QID) | RESPIRATORY_TRACT | Status: DC | PRN
  Administered 2018-06-22: 2.5 mg via RESPIRATORY_TRACT
  Filled 2018-06-22: qty 3

## 2018-06-22 MED ORDER — VITAMIN B-1 100 MG PO TABS: 100.0000 mg | ORAL_TABLET | Freq: Every day | ORAL | Status: DC

## 2018-06-22 MED ORDER — IOHEXOL 350 MG/ML SOLN
100.0000 mL | Freq: Once | INTRAVENOUS | Status: AC | PRN
  Administered 2018-06-22: 100 mL via INTRAVENOUS

## 2018-06-22 MED ORDER — THIAMINE HCL 100 MG/ML IJ SOLN
100.0000 mg | Freq: Every day | INTRAMUSCULAR | Status: DC
  Administered 2018-06-22 – 2018-06-27 (×6): 100 mg via INTRAVENOUS
  Filled 2018-06-22 (×6): qty 2

## 2018-06-22 MED ORDER — LORAZEPAM 1 MG PO TABS: 0.0000 mg | ORAL_TABLET | Freq: Two times a day (BID) | ORAL | Status: DC

## 2018-06-22 MED ORDER — SENNOSIDES-DOCUSATE SODIUM 8.6-50 MG PO TABS
1.0000 | ORAL_TABLET | Freq: Every evening | ORAL | Status: DC | PRN
  Filled 2018-06-22: qty 1

## 2018-06-22 MED ORDER — NICOTINE 21 MG/24HR TD PT24
21.0000 mg | MEDICATED_PATCH | Freq: Every day | TRANSDERMAL | Status: DC
  Administered 2018-06-22 – 2018-06-27 (×6): 21 mg via TRANSDERMAL
  Filled 2018-06-22 (×6): qty 1

## 2018-06-22 MED ORDER — MOMETASONE FURO-FORMOTEROL FUM 200-5 MCG/ACT IN AERO
2.0000 | INHALATION_SPRAY | Freq: Two times a day (BID) | RESPIRATORY_TRACT | Status: DC
  Filled 2018-06-22: qty 8.8

## 2018-06-22 MED ORDER — LORAZEPAM 1 MG PO TABS: 0.0000 mg | ORAL_TABLET | Freq: Four times a day (QID) | ORAL | Status: DC

## 2018-06-22 MED ORDER — CHLORDIAZEPOXIDE HCL 25 MG PO CAPS: 25.0000 mg | ORAL_CAPSULE | Freq: Once | ORAL | Status: DC

## 2018-06-22 MED ORDER — HEPARIN SODIUM (PORCINE) 5000 UNIT/ML IJ SOLN
5000.0000 [IU] | Freq: Three times a day (TID) | INTRAMUSCULAR | Status: DC
  Administered 2018-06-22 – 2018-06-27 (×15): 5000 [IU] via SUBCUTANEOUS
  Filled 2018-06-22 (×15): qty 1

## 2018-06-22 MED ORDER — LORAZEPAM 2 MG/ML IJ SOLN: 0.0000 mg | Freq: Two times a day (BID) | INTRAMUSCULAR | Status: DC

## 2018-06-22 NOTE — Progress Notes (Signed)
Called by RN patient in need of PRN neb.  Assessment performed on patient patient has labored breathing neb given.  Called rapid response to take a look at patient.

## 2018-06-22 NOTE — Progress Notes (Signed)
Patient arrived in the unit at 2055 from ED escorted by ED nurse, pt seems very restlessness, labor breathing, not responding well verbally, extremely sweating; situated comfortably in a bed, initiated telemetry  monitor, and will continue to monitor closely.

## 2018-06-22 NOTE — ED Notes (Signed)
Unable to give report to 3W. They requested report be called after shift change.

## 2018-06-22 NOTE — ED Notes (Signed)
Got patient into a gown on the monitor did e3kg shown to er doctor patient is resting with call bell in reach

## 2018-06-22 NOTE — Progress Notes (Signed)
Patient was educated on the use of the Accu Chek blood glucose meter. Reviewed necessary supplies and operation of the meter. Also reviewed goal blood glucose levels. Patient was able to demonstrate use. All questions and concerns were addressed.  Patient was educated on the use of the Lantus pen. Reviewed necessary supplies and operation of the pen. Also reviewed goal blood glucose levels. Patient was able to demonstrate use. All questions and concerns were addressed.
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
## 2018-06-22 NOTE — ED Notes (Signed)
Pt unable to lie still in CT scanner

## 2018-06-22 NOTE — ED Notes (Signed)
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 cortex and claustrum. 4. 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 5.  Multiple foci of arterial vascular calcification. 6.  Mucosal thickening in several ethmoid air cells. These results were called by telephone at the time of interpretation on 06/15/2018 at 11:53 am to [facility] , who verbally acknowledged these results. Electronically Signed   By: Lowella Grip III M.D.   On: 06/17/2018 11:53   Ct Angio Neck W Or Wo Contrast  Result Date: 06/26/2018 CLINICAL DATA:  Acute presentation with left-sided weakness and left facial droop EXAM: CT ANGIOGRAPHY HEAD AND NECK CT PERFUSION BRAIN TECHNIQUE: Multidetector CT imaging of the head and neck was performed using the standard protocol during bolus administration of intravenous contrast. Multiplanar CT image reconstructions and MIPs were obtained to evaluate the vascular anatomy. Carotid stenosis measurements (when applicable) are obtained utilizing NASCET criteria, using the distal internal carotid diameter as the denominator. Multiphase CT imaging of the brain was  performed following IV bolus contrast injection. Subsequent parametric perfusion maps were calculated using RAPID software. CONTRAST:  159mL OMNIPAQUE IOHEXOL 350 MG/ML SOLN COMPARISON:  Head CT earlier same day.  MRI 10/08/2017. FINDINGS: CTA NECK FINDINGS Aortic arch: Aortic atherosclerosis. No aneurysm or dissection. Branching pattern is normal. Right carotid system: Common carotid artery widely patent to the bifurcation. Soft and calcified plaque at the carotid bifurcation. Advanced disease in the ICA bulb with right internal carotid artery occlusion at that location. No reconstituted flow through the skull base. Left carotid system: Common carotid artery shows atherosclerotic plaque with narrowing and of an Scholl near occlusion just proximal to the bifurcation. Distal to that, the distal common carotid artery returns to normal caliber just proximal to the bifurcation. There is advanced atherosclerotic disease at the carotid bifurcation and proximal ICA with occlusion of the ICA at that level. No reconstituted flow seen through the skull base. Vertebral arteries: Atherosclerotic plaque at the right vertebral artery origin with stenosis of 50%. 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 The right vertebral artery shows  atherosclerotic disease but is patent through the cervical region without flow limiting stenosis. The left vertebral artery origin is widely patent. This non dominant vessel does show some atherosclerotic irregularity but no severe stenosis. The vessel is patent through the cervical region to the foramen magnum. Skeleton: Previous ACDF C4-5. Chronic spondylosis above and below that. Other neck: No soft tissue mass or lymphadenopathy. Upper chest: Emphysema and scarring within the upper lungs. Review of the MIP images confirms the above findings CTA HEAD FINDINGS Anterior circulation: As noted above, both internal carotid arteries are occluded at the level  of the neck and through the skull base region. There is reconstituted flow in both supraclinoid internal carotid arteries which could be due to a combination of external to internal collaterals and patent posterior communicating arteries. On the left, the anterior and middle cerebral arteries are narrow and irregular but patent. No acute branch vessel occlusion seen on the left. On the right, the anterior and middle cerebral vessels are narrow and irregular but do show flow. There is an acute M2 branch occlusion in the insular region. Posterior circulation: Both vertebral arteries show atherosclerotic disease and narrowing in the V4 segments with stenosis estimated at 50%. Both vessels do reach the basilar. The basilar artery shows mild atherosclerotic irregularity. Superior cerebellar and posterior cerebral vessels are patent. Venous sinuses: Patent and normal. Anatomic variants: None significant. Delayed phase: Not performed. Review of the MIP images confirms the above findings CT Brain Perfusion Findings: ASPECTS: 7 CBF (<30%) Volume: 21mL Perfusion (Tmax>6.0s) volume: 88mL Mismatch Volume: 29mL Infarction Location:Right insula and frontoparietal junction region. IMPRESSION: Newly seen and presumably acute occlusion of the right ICA at the distal bulb level. 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 Core measures 28 cc. The penumbra measures 9 cc, but this may be exaggerated as the analytic program is bringing in a small focus in the left frontal lobe. These results were called by telephone at the time of interpretation on 07/11/2018 at 1:31 pm to Dr. Dalia Heading , who verbally acknowledged these results. Electronically Signed   By: Nelson Chimes M.D.   On: 06/21/2018  13:35   Ct Code Stroke Cta Cerebral Perfusion W/wo Contrast  Result Date: 06/30/2018 CLINICAL DATA:  Acute presentation with left-sided weakness and left facial droop EXAM: CT ANGIOGRAPHY HEAD AND NECK CT PERFUSION BRAIN TECHNIQUE: Multidetector CT imaging of the head and neck was performed using the standard protocol during bolus administration of intravenous contrast. Multiplanar CT image reconstructions and MIPs were obtained to evaluate the vascular anatomy. Carotid stenosis measurements (when applicable) are obtained utilizing NASCET criteria, using the distal internal carotid diameter as the denominator. Multiphase CT imaging of the brain was performed following IV bolus contrast injection. Subsequent parametric perfusion maps were calculated using RAPID software. CONTRAST:  116mL OMNIPAQUE IOHEXOL 350 MG/ML SOLN COMPARISON:  Head CT earlier same day.  MRI 10/08/2017. FINDINGS: CTA NECK FINDINGS Aortic arch: Aortic atherosclerosis. No aneurysm or dissection. Branching pattern is normal. Right carotid system: Common carotid artery widely patent to the bifurcation. Soft and calcified plaque at the carotid bifurcation. Advanced disease in the ICA bulb with right internal carotid artery occlusion at that location. No reconstituted flow through the skull base. Left carotid system: Common carotid artery shows atherosclerotic plaque with narrowing  and of an Darrick Grinder near occlusion just proximal to the bifurcation. Distal to that, the distal common carotid artery returns to normal caliber just proximal to the bifurcation. There is advanced atherosclerotic disease at the carotid bifurcation and proximal ICA with occlusion of the ICA at that level. No reconstituted flow seen through the skull base. Vertebral arteries: Atherosclerotic plaque at the right vertebral artery origin with stenosis of 50%. 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 The right vertebral artery shows  atherosclerotic disease but is patent through the cervical region without flow limiting stenosis. The left vertebral artery origin is widely patent. This non dominant vessel does show some atherosclerotic irregularity but no severe stenosis. The vessel is patent through the cervical region to the foramen magnum. Skeleton: Previous ACDF C4-5. Chronic spondylosis above and below that. Other neck: No soft tissue mass or lymphadenopathy. Upper chest: Emphysema and scarring within the upper lungs. Review of the MIP images confirms the above findings CTA HEAD FINDINGS Anterior circulation: As noted above, both internal carotid arteries are occluded at the level of the neck and through the skull base region. There is reconstituted flow in both supraclinoid internal carotid arteries which could be due to a combination of external to internal collaterals and patent posterior communicating arteries. On the left, the anterior and middle cerebral arteries are narrow and irregular but patent. No acute branch vessel occlusion seen on the left. On the right, the anterior and middle cerebral vessels are narrow and irregular but do show flow. There is an acute M2 branch occlusion in the insular region. Posterior circulation: Both vertebral arteries show atherosclerotic disease and narrowing in the V4 segments with stenosis estimated at 50%. Both vessels do reach the basilar. The basilar artery shows mild atherosclerotic irregularity. Superior cerebellar and posterior cerebral vessels are patent. Venous sinuses: Patent and normal. Anatomic variants: None significant. Delayed phase: Not performed. Review of the MIP images confirms the above findings CT Brain Perfusion Findings: ASPECTS: 7 CBF (<30%) Volume: 26mL Perfusion (Tmax>6.0s) volume: 87mL Mismatch Volume: 69mL Infarction Location:Right insula and frontoparietal junction region. IMPRESSION: Newly seen and presumably acute occlusion of the right ICA at the distal bulb level.  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 Core measures 28 cc. The penumbra measures 9 cc, but this may be exaggerated as the analytic program is bringing in a small focus in the left frontal lobe. These results were called by telephone at the time of interpretation on 06/16/2018 at 1:31 pm to Dr. Dalia Heading , who verbally acknowledged these results. Electronically Signed   By: Nelson Chimes M.D.   On: 06/21/2018 13:35   Dg Chest Port 1 View  Result Date: 06/26/2018 CLINICAL DATA:  Stroke-like symptoms EXAM: PORTABLE CHEST 1 VIEW COMPARISON:  06/14/2018, 05/14/2018 FINDINGS: No acute opacity or pleural effusion. Stable cardiomediastinal silhouette. No pneumothorax. Old left upper rib fractures. Aortic atherosclerosis. IMPRESSION: No active disease. Electronically Signed   By: Donavan Foil M.D.   On: 06/13/2018 16:12    Pending Labs Unresulted Labs (From admission, onward)    Start     Ordered   06/23/18 1497  Basic metabolic panel  Tomorrow morning,   R     06/21/2018 1418   06/23/18 0500  CBC  Tomorrow  morning,   R     07/05/2018 1418   06/23/18 0500  Hemoglobin A1c  Tomorrow morning,   R     06/29/2018 1517   06/23/18 0500  Lipid panel  Tomorrow morning,   R     06/29/2018 1517   06/23/2018 1503  Culture, blood (routine x 2)  BLOOD CULTURE X 2,   R     07/12/2018 1502   06/18/2018 1453  Novel Coronavirus,NAA,(SEND-OUT TO REF LAB - TAT 24-48 hrs); Hosp Order  (Asymptomatic Patients Labs)  Once,   R    Question:  Rule Out  Answer:  Yes   06/27/2018 1452   07/06/2018 0921  Lactic acid, plasma  Now then every 2 hours,   STAT     06/24/2018 0920          Vitals/Pain Today's Vitals   07/11/2018 1737 06/21/2018 1810 06/27/2018 1815 07/01/2018 1818  BP: (!) 165/92 (!)  200/100 (!) 200/100 (!) 167/96  Pulse: (!) 142 (!) 150 (!) 135 (!) 140  Resp:    (!) 22  Temp:      SpO2: 95% 94%  94%  Weight:      Height:        Isolation Precautions No active isolations  Medications Medications  thiamine (VITAMIN B-1) tablet 100 mg ( Oral See Alternative 07/07/2018 1410)    Or  thiamine (B-1) injection 100 mg (100 mg Intravenous Given 07/01/2018 1410)  albuterol (PROVENTIL) (2.5 MG/3ML) 0.083% nebulizer solution 2.5 mg (has no administration in time range)  mometasone-formoterol (DULERA) 200-5 MCG/ACT inhaler 2 puff (has no administration in time range)  nicotine (NICODERM CQ - dosed in mg/24 hours) patch 21 mg (has no administration in time range)  pantoprazole (PROTONIX) EC tablet 20 mg (20 mg Oral Not Given 06/21/2018 1823)  senna-docusate (Senokot-S) tablet 1 tablet (has no administration in time range)  promethazine (PHENERGAN) tablet 12.5 mg (has no administration in time range)  aspirin suppository 300 mg (has no administration in time range)  chlordiazePOXIDE (LIBRIUM) capsule 25 mg (25 mg Oral Not Given 06/15/2018 1823)  LORazepam (ATIVAN) injection 2-3 mg (2 mg Intravenous Given 06/20/2018 1818)  heparin injection 5,000 Units (has no administration in time range)  LORazepam (ATIVAN) injection 2 mg (2 mg Intravenous Given 06/14/2018 1245)  iohexol (OMNIPAQUE) 350 MG/ML injection 100 mL (100 mLs Intravenous Contrast Given 06/17/2018 1314)    Mobility non-ambulatory     Focused Assessments Neuro Assessment Handoff:  Swallow screen pass? No    NIH Stroke Scale ( + Modified Stroke Scale Criteria)  Interval: Initial Level of Consciousness (1a.)   : Alert, keenly responsive LOC Questions (1b. )   +: Answers both questions correctly LOC Commands (1c. )   + : Performs both tasks correctly Best Gaze (2. )  +: Partial gaze palsy Visual (3. )  +: Partial hemianopia Facial Palsy (4. )    : Partial paralysis  Motor Arm, Left (5a. )   +: No effort against gravity Motor  Arm, Right (5b. )   +: Drift Motor Leg, Left (6a. )   +: No effort against gravity Motor Leg, Right (6b. )   +: Amputation or joint fusion Limb Ataxia (7. ): Absent Sensory (8. )   +: Normal, no sensory loss Best Language (9. )   +: Mild-to-moderate aphasia Dysarthria (10. ): Mild-to-moderate dysarthria, patient slurs at least some words and, at worst, can be understood with some difficulty Extinction/Inattention (11.)   +: Visual/tactile/auditory/spatial/personal inattention  Modified SS Total  +: 11 Complete NIHSS TOTAL: 14     Neuro Assessment:   Neuro Checks:   Initial ( 0900)  Last Documented NIHSS Modified Score: 11 (06/19/2018 1500) Has TPA been given? No If patient is a Neuro Trauma and patient is going to OR before floor call report to [facility] nurse: [phone] or [phone]     R Recommendations: See Admitting Provider Note  Report given to:   Additional Notes:

## 2018-06-22 NOTE — ED Notes (Signed)
MD at bedside. MD and neuro PA aware of patient blood pressure.

## 2018-06-22 NOTE — ED Notes (Signed)
Per security, pt's daughter was at desk asking to come back to see pt. This RN was delayed in getting to the security desk d/t acute care needs of pt. Daughter was no longer at desk when this RN arrived. Discussed with charge, if she returns she MAY come back with green bracelet as pt meets ALONE criteria.

## 2018-06-22 NOTE — ED Notes (Signed)
Patient transported to CT 

## 2018-06-22 NOTE — ED Triage Notes (Signed)
Pt BIB [facility] for stroke like symptoms. Per EMS patient was found by roommate grunting in his wheelchair for help. Per EMS patient has slurred speech, left sided weakness, left sided facial droop. Pt is very difficulty to understand. No last seen normal reported per EMS. Per EMS patient does not have any family contacts and lives with a roommate.

## 2018-06-22 NOTE — Progress Notes (Signed)
Evaluated patient at bedside for concerns of diaphoresis and tachycardia. He does appear to be more alert compared to our first interaction several hours ago. He is grunting but does not follow commands. He does appear to be uncomfortable and very diaphoretic. He has pin point pupils that are reactive to light. Tachycardic to 130's and hypertensive with SBP up to 160's. COWS 22. I suspect that his symptoms are 2/2 alcohol withdrawal. Will give 2 mg Ativan now.
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
## 2018-06-22 NOTE — ED Notes (Signed)
MD notified of HR in 130's-140's

## 2018-06-23 ENCOUNTER — Inpatient Hospital Stay: Payer: Self-pay

## 2018-06-23 ENCOUNTER — Inpatient Hospital Stay (HOSPITAL_COMMUNITY): Payer: Medicaid Other

## 2018-06-23 DIAGNOSIS — I639 Cerebral infarction, unspecified: Secondary | ICD-10-CM

## 2018-06-23 DIAGNOSIS — Z515 Encounter for palliative care: Secondary | ICD-10-CM

## 2018-06-23 LAB — BASIC METABOLIC PANEL
Anion gap: 13 (ref 5–15)
BUN: 24 mg/dL — ABNORMAL HIGH (ref 6–20)
CO2: 23 mmol/L (ref 22–32)
Calcium: 8.9 mg/dL (ref 8.9–10.3)
Chloride: 103 mmol/L (ref 98–111)
Creatinine, Ser: 1.16 mg/dL (ref 0.61–1.24)
GFR calc Af Amer: >60 mL/min (ref >60)
GFR calc non Af Amer: >60 mL/min (ref >60)
Glucose, Bld: 270 mg/dL — ABNORMAL HIGH (ref 70–99)
Potassium: 4.1 mmol/L (ref 3.5–5.1)
Sodium: 139 mmol/L (ref 135–145)

## 2018-06-23 LAB — POCT I-STAT 7, (LYTES, BLD GAS, ICA,H+H)
Acid-Base Excess: 6 mmol/L — ABNORMAL HIGH (ref 0.0–2.0)
Bicarbonate: 30.9 mmol/L — ABNORMAL HIGH (ref 20.0–28.0)
Calcium, Ion: 1.17 mmol/L (ref 1.15–1.40)
HCT: 48 % (ref 39.0–52.0)
Hemoglobin: 16.3 g/dL (ref 13.0–17.0)
O2 Saturation: 100 %
Patient temperature: 100.4
Potassium: 4.3 mmol/L (ref 3.5–5.1)
Sodium: 136 mmol/L (ref 135–145)
TCO2: 32 mmol/L (ref 22–32)
pCO2 arterial: 44.1 mmHg (ref 32.0–48.0)
pH, Arterial: 7.458 — ABNORMAL HIGH (ref 7.350–7.450)
pO2, Arterial: 363 mmHg — ABNORMAL HIGH (ref 83.0–108.0)

## 2018-06-23 LAB — SODIUM
Sodium: 142 mmol/L (ref 135–145)
Sodium: 144 mmol/L (ref 135–145)

## 2018-06-23 LAB — LIPID PANEL
Cholesterol: 285 mg/dL — ABNORMAL HIGH (ref 0–200)
HDL: 45 mg/dL (ref >40)
LDL Cholesterol: 160 mg/dL — ABNORMAL HIGH (ref 0–99)
Total CHOL/HDL Ratio: 6.3 RATIO
Triglycerides: 400 mg/dL — ABNORMAL HIGH (ref <150)
VLDL: 80 mg/dL — ABNORMAL HIGH (ref 0–40)

## 2018-06-23 LAB — SARS CORONAVIRUS 2: SARS Coronavirus 2: NOT DETECTED

## 2018-06-23 LAB — GLUCOSE, CAPILLARY
Glucose-Capillary: 320 mg/dL — ABNORMAL HIGH (ref 70–99)
Glucose-Capillary: 255 mg/dL — ABNORMAL HIGH (ref 70–99)
Glucose-Capillary: 207 mg/dL — ABNORMAL HIGH (ref 70–99)
Glucose-Capillary: 204 mg/dL — ABNORMAL HIGH (ref 70–99)
Glucose-Capillary: 151 mg/dL — ABNORMAL HIGH (ref 70–99)
Glucose-Capillary: 123 mg/dL — ABNORMAL HIGH (ref 70–99)
Glucose-Capillary: 143 mg/dL — ABNORMAL HIGH (ref 70–99)
Glucose-Capillary: 213 mg/dL — ABNORMAL HIGH (ref 70–99)
Glucose-Capillary: 167 mg/dL — ABNORMAL HIGH (ref 70–99)
Glucose-Capillary: 117 mg/dL — ABNORMAL HIGH (ref 70–99)
Glucose-Capillary: 120 mg/dL — ABNORMAL HIGH (ref 70–99)
Glucose-Capillary: 175 mg/dL — ABNORMAL HIGH (ref 70–99)

## 2018-06-23 LAB — HEMOGLOBIN A1C
Hgb A1c MFr Bld: 8.3 % — ABNORMAL HIGH (ref 4.8–5.6)
Mean Plasma Glucose: 191.51 mg/dL

## 2018-06-23 LAB — MAGNESIUM
Magnesium: 1.9 mg/dL (ref 1.7–2.4)
Magnesium: 1.7 mg/dL (ref 1.7–2.4)

## 2018-06-23 LAB — CBC
HCT: 49.5 % (ref 39.0–52.0)
Hemoglobin: 15.5 g/dL (ref 13.0–17.0)
MCH: 24.6 pg — ABNORMAL LOW (ref 26.0–34.0)
MCHC: 31.3 g/dL (ref 30.0–36.0)
MCV: 78.4 fL — ABNORMAL LOW (ref 80.0–100.0)
Platelets: 261 10*3/uL (ref 150–400)
RBC: 6.31 MIL/uL — ABNORMAL HIGH (ref 4.22–5.81)
RDW: 21.6 % — ABNORMAL HIGH (ref 11.5–15.5)
WBC: 21.9 10*3/uL — ABNORMAL HIGH (ref 4.0–10.5)
nRBC: 0 % (ref 0.0–0.2)

## 2018-06-23 LAB — MRSA PCR SCREENING: MRSA by PCR: NEGATIVE

## 2018-06-23 LAB — NOVEL CORONAVIRUS, NAA (HOSP ORDER, SEND-OUT TO REF LAB; TAT 18-24 HRS): SARS-CoV-2, NAA: NOT DETECTED

## 2018-06-23 LAB — PHOSPHORUS
Phosphorus: 4.1 mg/dL (ref 2.5–4.6)
Phosphorus: 3.5 mg/dL (ref 2.5–4.6)

## 2018-06-23 MED ORDER — FENTANYL 2500MCG IN NS 250ML (10MCG/ML) PREMIX INFUSION: 0.0000 ug/h | INTRAVENOUS | Status: DC

## 2018-06-23 MED ORDER — INSULIN REGULAR(HUMAN) IN NACL 100-0.9 UT/100ML-% IV SOLN: INTRAVENOUS | Status: DC

## 2018-06-23 MED ORDER — LINEZOLID 600 MG/300ML IV SOLN
600.0000 mg | Freq: Once | INTRAVENOUS | Status: AC
  Administered 2018-06-23: 600 mg via INTRAVENOUS
  Filled 2018-06-23: qty 300

## 2018-06-23 MED ORDER — FAMOTIDINE IN NACL 20-0.9 MG/50ML-% IV SOLN
20.0000 mg | Freq: Two times a day (BID) | INTRAVENOUS | Status: DC
  Administered 2018-06-23 – 2018-06-24 (×2): 20 mg via INTRAVENOUS
  Filled 2018-06-23 (×2): qty 50

## 2018-06-23 MED ORDER — ACETAMINOPHEN 160 MG/5ML PO SOLN
650.0000 mg | ORAL | Status: DC
  Administered 2018-06-23 – 2018-06-27 (×25): 650 mg
  Filled 2018-06-23 (×25): qty 20.3

## 2018-06-23 MED ORDER — BUSPIRONE HCL 15 MG PO TABS
30.0000 mg | ORAL_TABLET | Freq: Three times a day (TID) | ORAL | Status: DC
  Filled 2018-06-23 (×2): qty 2

## 2018-06-23 MED ORDER — BUSPIRONE HCL 15 MG PO TABS
30.0000 mg | ORAL_TABLET | Freq: Three times a day (TID) | ORAL | Status: DC
  Administered 2018-06-23 – 2018-06-27 (×13): 30 mg
  Filled 2018-06-23 (×13): qty 2

## 2018-06-23 MED ORDER — ORAL CARE MOUTH RINSE
15.0000 mL | OROMUCOSAL | Status: DC
  Administered 2018-06-23 – 2018-06-27 (×45): 15 mL via OROMUCOSAL

## 2018-06-23 MED ORDER — INSULIN REGULAR(HUMAN) IN NACL 100-0.9 UT/100ML-% IV SOLN
INTRAVENOUS | Status: DC
  Administered 2018-06-23: 2 [IU]/h via INTRAVENOUS
  Administered 2018-06-24: 1.6 [IU]/h via INTRAVENOUS
  Filled 2018-06-23 (×2): qty 100

## 2018-06-23 MED ORDER — CLOPIDOGREL BISULFATE 75 MG PO TABS
75.0000 mg | ORAL_TABLET | Freq: Every day | ORAL | Status: DC
  Administered 2018-06-23 – 2018-06-27 (×5): 75 mg
  Filled 2018-06-23 (×5): qty 1

## 2018-06-23 MED ORDER — SODIUM CHLORIDE 0.9 % IV BOLUS
500.0000 mL | Freq: Once | INTRAVENOUS | Status: AC
  Administered 2018-06-23: 500 mL via INTRAVENOUS

## 2018-06-23 MED ORDER — ACETAMINOPHEN 325 MG PO TABS: 650.0000 mg | ORAL_TABLET | ORAL | Status: DC

## 2018-06-23 MED ORDER — SODIUM CHLORIDE 3 % IV SOLN
INTRAVENOUS | Status: AC
  Administered 2018-06-23 – 2018-06-24 (×4): 75 mL/h via INTRAVENOUS
  Filled 2018-06-23 (×6): qty 500

## 2018-06-23 MED ORDER — ACETAMINOPHEN 650 MG RE SUPP: 650.0000 mg | RECTAL | Status: DC

## 2018-06-23 MED ORDER — ETOMIDATE 2 MG/ML IV SOLN
20.0000 mg | Freq: Once | INTRAVENOUS | Status: AC
  Administered 2018-06-23: 20 mg via INTRAVENOUS

## 2018-06-23 MED ORDER — PRO-STAT SUGAR FREE PO LIQD
30.0000 mL | Freq: Two times a day (BID) | ORAL | Status: DC
  Administered 2018-06-23 – 2018-06-27 (×9): 30 mL
  Filled 2018-06-23 (×9): qty 30

## 2018-06-23 MED ORDER — SODIUM CHLORIDE 0.9 % IV SOLN
1.0000 g | Freq: Three times a day (TID) | INTRAVENOUS | Status: DC
  Administered 2018-06-23 – 2018-06-24 (×2): 1 g via INTRAVENOUS
  Filled 2018-06-23 (×6): qty 1

## 2018-06-23 MED ORDER — MIDAZOLAM HCL 2 MG/2ML IJ SOLN
2.0000 mg | Freq: Once | INTRAMUSCULAR | Status: AC
  Administered 2018-06-23: 02:00:00 2 mg via INTRAVENOUS
  Filled 2018-06-23: qty 2

## 2018-06-23 MED ORDER — ACETAMINOPHEN 650 MG RE SUPP
650.0000 mg | Freq: Four times a day (QID) | RECTAL | Status: DC | PRN
  Administered 2018-06-23: 650 mg via RECTAL
  Filled 2018-06-23: qty 1

## 2018-06-23 MED ORDER — CHLORHEXIDINE GLUCONATE CLOTH 2 % EX PADS
6.0000 | MEDICATED_PAD | Freq: Every day | CUTANEOUS | Status: DC
  Administered 2018-06-25 – 2018-06-28 (×3): 6 via TOPICAL

## 2018-06-23 MED ORDER — PHENYLEPHRINE HCL-NACL 10-0.9 MG/250ML-% IV SOLN
25.0000 ug/min | INTRAVENOUS | Status: DC
  Administered 2018-06-23: 25 ug/min via INTRAVENOUS
  Administered 2018-06-23: 60 ug/min via INTRAVENOUS
  Administered 2018-06-23: 45 ug/min via INTRAVENOUS
  Administered 2018-06-24: 65 ug/min via INTRAVENOUS
  Filled 2018-06-23 (×7): qty 250

## 2018-06-23 MED ORDER — SODIUM CHLORIDE 0.9 % IV BOLUS
500.0000 mL | Freq: Once | INTRAVENOUS | Status: AC
  Administered 2018-06-23: 01:00:00 500 mL via INTRAVENOUS

## 2018-06-23 MED ORDER — INSULIN ASPART 100 UNIT/ML ~~LOC~~ SOLN: 2.0000 [IU] | SUBCUTANEOUS | Status: DC

## 2018-06-23 MED ORDER — FENTANYL CITRATE (PF) 100 MCG/2ML IJ SOLN
100.0000 ug | Freq: Once | INTRAMUSCULAR | Status: AC
  Administered 2018-06-23: 100 ug via INTRAVENOUS
  Filled 2018-06-23: qty 2

## 2018-06-23 MED ORDER — SODIUM CHLORIDE 0.9 % IV SOLN: 250.0000 mL | INTRAVENOUS | Status: DC

## 2018-06-23 MED ORDER — INSULIN GLARGINE 100 UNIT/ML ~~LOC~~ SOLN
10.0000 [IU] | Freq: Every day | SUBCUTANEOUS | Status: DC
  Filled 2018-06-23: qty 0.1

## 2018-06-23 MED ORDER — CHLORHEXIDINE GLUCONATE CLOTH 2 % EX PADS: 6.0000 | MEDICATED_PAD | Freq: Every day | CUTANEOUS | Status: DC

## 2018-06-23 MED ORDER — OSMOLITE 1.5 CAL PO LIQD
1000.0000 mL | ORAL | Status: DC
  Administered 2018-06-23 – 2018-06-26 (×4): 1000 mL
  Filled 2018-06-23 (×6): qty 1000

## 2018-06-23 MED ORDER — VITAL HIGH PROTEIN PO LIQD: 1000.0000 mL | ORAL | Status: DC

## 2018-06-23 MED ORDER — CHLORHEXIDINE GLUCONATE 0.12% ORAL RINSE (MEDLINE KIT)
15.0000 mL | Freq: Two times a day (BID) | OROMUCOSAL | Status: DC
  Administered 2018-06-23 – 2018-06-27 (×10): 15 mL via OROMUCOSAL

## 2018-06-23 MED ORDER — SODIUM CHLORIDE 0.9 % IV SOLN
INTRAVENOUS | Status: DC
  Administered 2018-06-23 (×2): via INTRAVENOUS

## 2018-06-23 MED ORDER — ACETAMINOPHEN 325 MG PO TABS: 650.0000 mg | ORAL_TABLET | Freq: Four times a day (QID) | ORAL | Status: DC | PRN

## 2018-06-23 MED ORDER — SODIUM CHLORIDE 0.9 % IV SOLN
INTRAVENOUS | Status: DC
  Administered 2018-06-23: 04:00:00 via INTRAVENOUS

## 2018-06-23 MED ORDER — PIPERACILLIN-TAZOBACTAM 3.375 G IVPB: 3.3750 g | Freq: Three times a day (TID) | INTRAVENOUS | Status: DC

## 2018-06-23 MED ORDER — PANTOPRAZOLE SODIUM 40 MG PO PACK
20.0000 mg | PACK | Freq: Every day | ORAL | Status: DC
  Administered 2018-06-23 – 2018-06-24 (×2): 20 mg
  Filled 2018-06-23 (×2): qty 20

## 2018-06-23 MED ORDER — MAGNESIUM SULFATE IN D5W 1-5 GM/100ML-% IV SOLN
1.0000 g | Freq: Once | INTRAVENOUS | Status: AC
  Administered 2018-06-23: 1 g via INTRAVENOUS
  Filled 2018-06-23: qty 100

## 2018-06-23 MED ORDER — ASPIRIN 81 MG PO CHEW
81.0000 mg | CHEWABLE_TABLET | Freq: Every day | ORAL | Status: DC
  Administered 2018-06-23 – 2018-06-25 (×3): 81 mg
  Filled 2018-06-23 (×3): qty 1

## 2018-06-23 MED ORDER — FENTANYL 2500MCG IN NS 250ML (10MCG/ML) PREMIX INFUSION
0.0000 ug/h | INTRAVENOUS | Status: DC
  Administered 2018-06-23: 250 ug/h via INTRAVENOUS
  Administered 2018-06-23 – 2018-06-24 (×2): 150 ug/h via INTRAVENOUS
  Filled 2018-06-23 (×2): qty 250

## 2018-06-23 NOTE — Progress Notes (Signed)
Evaluated at bedside for concerns of worsening tachypnea, tachycardia and cheyne-stoke breathing pattern. He was tachycardic in the 140s with an abnormal breathing pattern. He was exhibiting cheyne-stoke pattern with apneic periods. At this time he is able to protect his airway but there is a concern for this to decline due to his alcohol withdrawal being treated with benzos.   Vitals:   06/23/2018 2333 06/21/2018 2351  BP: 131/90   Pulse: (!) 139   Resp: (!) 22   Temp: (!) 101.2 F (38.4 C)   SpO2: 94% 92%   Physical exam Gen: laying in bed exhibiting cheyne stoke breathing pattern with apneic periods  CV: tachycardic, regular rhythm, no murmurs  Pulm: tachypneic, CTA bilaterally on lateral lung fields   Assessment/Plan: Discussed with PCCM and agreed to transfer to ICU given periods of apnea with need for benzos for alcohol withdrawal, at risk for further respiratory compromise and may need intubation. Also patient spiked a fever of 101.2, do not suspect infection at this point. Fever likely due to alcohol withdrawal vs infarct. CXR showed no signs of infection. Will follow blood cultures.  - Transfer to ICU - Tylenol for fever - IVF
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
## 2018-06-23 NOTE — Social Work (Signed)
CSW acknowledging consults x2 for substance use, will follow for medical stability.  Westley Hummer, MSW, [place] [facility] [phone]

## 2018-06-23 NOTE — Progress Notes (Signed)
Pt's pupils are now slightly unequal with the right 4 and left 3. They are sluggish. Pt is withdrawing to pain in the upper extremities but not purposeful on the left anymore. He does not open his eyes or follow commands. MD made aware of the pupillary change. He has reviewed the MRI and has spoken with family. They are agreeing to make pt DNR at this time and will meet with Dr. [name] at 0900 on 6/12 to make decisions about goals of care. Cheynne Virden, Rande Brunt, RN

## 2018-06-23 NOTE — Progress Notes (Signed)
Patient with thick tenacious phlegm, will start zosyn for possible aspiration, tracheal aspirate ordered.

## 2018-06-23 NOTE — Progress Notes (Addendum)
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 medical decision making of high complexity.I have made any additions or clarifications directly to the above note.This critical care time does not reflect procedure time, or teaching time or supervisory time of PA/NP/Med Resident etc but could involve care discussion time.  I spent 30 minutes of neurocritical care time  in the care of  this patient.      Antony Contras, MD Medical Director [facility] Pager: [phone] 06/23/2018 2:40 PM  ADDENDUM : I reviewed MRI scan of the brain which the patient had which shows extensive right MCA infarct with cytotoxic edema and midline shift.  Patient's neurological exam is also deteriorated with right pupil being larger and patient being more unresponsive and not moving his left arm well.  I spoke to the patient's son over the phone and informed him about his poor prognosis and likely significant neurological disability if he were to survive.  He understands and agrees to DNR but wants to discuss with rest of the family goals of care and a palliative care approach.  He wants to meet with me in person in the patient's room tomorrow morning at 9:00.  Meanwhile I recommend  starting hypertonic saline through peripheral IV at 75 cc an hour and repeating CT scan of the head without contrast tomorrow morning. Antony Contras, MD To contact Stroke Continuity provider, please refer to [email or web address] After hours, contact General Neurology

## 2018-06-23 NOTE — Progress Notes (Signed)
Initial Nutrition Assessment RD working remotely.  DOCUMENTATION CODES:   Underweight  INTERVENTION:   Initiate Osmolite 1.5 @ 20 ml/hr and increase by 10 ml every 12 hours to goal rate of 50 ml/hr 30 ml Prostat BID  Provides: 2000 kcal, 105 grams protein, and 914 ml free water.  Monitor magnesium phosphorus every 12 hours for 4 occurances MD to replete as needed, as pt is at risk for refeeding syndrome given pt underweight with likely poor nutrition.   NUTRITION DIAGNOSIS:   Increased nutrient needs related to chronic illness(COPD) as evidenced by estimated needs.  GOAL:   Patient will meet greater than or equal to 90% of their needs  MONITOR:   Vent status, Skin, Labs, TF tolerance  REASON FOR ASSESSMENT:   Consult, Ventilator Enteral/tube feeding initiation and management  ASSESSMENT:   Pt with PMH of COPD, depression, DM, hepatitis C, HTN, HLD, PVD, s/p R AKA in 2019 who is now admitted with moderate R ICH CVA.   Noted pt underweight. Per hx pt lost 100 lb 5 years ago and regained some but appears to have lost more weight. Pt likely meets criteria for malnutrition but unable to confirm at this time.   Patient is currently intubated on ventilator support MV: 11.8 L/min Temp (24hrs), Avg:101.5 F (38.6 C), Min:98.5 F (36.9 C), Max:104 F (40 C)  Medications reviewed and include:  Insulin drip, 100 mg thiamine Labs reviewed: PO4 and magnesium are WNL prior to TF initiation CBG's: 320-255    NUTRITION - FOCUSED PHYSICAL EXAM:  Deferred   Diet Order:   Diet Order            Diet NPO time specified  Diet effective now              EDUCATION NEEDS:   No education needs have been identified at this time  Skin:  Skin Assessment: Skin Integrity Issues: Skin Integrity Issues:: Stage II Stage II: sacrum  Last BM:  unknown  Height:   Ht Readings from Last 1 Encounters:  06/14/2018 6\' 1"  (1.854 m)    Weight:   Wt Readings from Last 1  Encounters:  06/23/18 54.9 kg    Ideal Body Weight:  76.9 kg  BMI:  17.36 - underweight  Estimated Nutritional Needs:   Kcal:  2018  Protein:  85-110 grams  Fluid:  >2L/day  Maylon Peppers RD, LDN, CNSC [phone] Pager [phone] After Hours Pager

## 2018-06-23 NOTE — Progress Notes (Signed)
EEG complete - results pending 
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
## 2018-06-23 NOTE — Progress Notes (Signed)
PT Cancellation Note  Patient Details Name: [name] MRN: [id number] DOB: Nov 27, 1960   Cancelled Treatment:    Reason Eval/Treat Not Completed: Patient not medically ready (now intubated).  Ellamae Sia, PT, DPT Acute Rehabilitation Services Pager [phone] Office [phone]    Willy Eddy 06/23/2018, 3:16 PM

## 2018-06-23 NOTE — Progress Notes (Signed)
Patient has transferred to ICU.

## 2018-06-23 NOTE — Significant Event (Signed)
Rapid Response Event Note  Overview: Time Called: 2355 Arrival Time: 0001 Event Type: Neurologic, Respiratory (tachypneic, cheyne stokes breathing pattern)  Initial Focused Assessment: I was called by RT Sharyn Lull regarding patient with tachypnea, increased WOB and cheyne stokes periodic breathing pattern.   Pt had new Right MCA infarct today. Temp 101.2 F, HR 139 ST, 131/90, RR 38 with sats 94% on 2LNC. NIHSS 24. I notified IMTS and they came to the bedside. We discuss POC and they consulted PCCM.   Interventions: -No acute interventions at this time  Plan of Care (if not transferred): -Discussed POC with IMTS team -treat fever with tylenol PR  Event Summary: Pt transferred to 4N26 following PCCM consult Call ended [phone]  Madelynn Done

## 2018-06-23 NOTE — Progress Notes (Signed)
OT Cancellation Note  Patient Details Name: [name] MRN: [id number] DOB: 09/06/1960   Cancelled Treatment:    Reason Eval/Treat Not Completed: Patient not medically ready.  Pt intubated.  Lucille Passy, OTR/L [place] Pager [phone] Office [phone]   Lucille Passy M 06/23/2018, 4:23 PM

## 2018-06-23 NOTE — Procedures (Signed)
Intubation Procedure Note [name] [id number] 06/24/1960  Procedure: Intubation  Indications: Respiratory insufficiency  Procedure Details Consent: Risks of procedure as well as the alternatives and risks of each were explained to the (patient/caregiver).  Consent for procedure obtained. Daughter Time Out: Verified patient identification, verified procedure, site/side was marked, verified correct patient position, special equipment/implants available, medications/allergies/relevent history reviewed, required imaging and test results available.  Performed  Maximum sterile technique was used including gloves and mask.  3    Evaluation Hemodynamic Status: BP stable throughout; O2 sats: stable throughout Patient's Current Condition: stable Complications: No apparent complications Patient did tolerate procedure well. Chest X-ray ordered to verify placement.  CXR: tube position acceptable.   Shellia Cleverly 06/23/2018
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
## 2018-06-23 NOTE — Consult Note (Signed)
[facility] Nurse wound consult note Patient receiving care in [facility] [id number]. At the current time, the FYIS box contains the following statement:  "POSSIBLE NOVEL CORONAVIRUS (2019-NCOV)". Reason for Consult: Stage 2 to sacrum Wound type: as above Pressure Injury POA: Yes Measurement, Wound bed,Drainage (amount, consistency, odor),Periwound: to be provided by bedside RN Dressing procedure/placement/frequency: I have activated the Skin Care Standing Orders for a stage 2 wound that includes facility approved skin care products and a sacral foam dressing.  Due to the nature of this patient's suspected COVID-19 with isolation and in keeping with efforts to prevent the spread of infection and to conserve personal protective equipment, a physical exam was not personally performed. A chart review of other providers notes.  Exam details from prior documentation were reviewed.    Thank you for the consult. [place] nurse will not follow at this time.  Please re-consult the [place] team if needed.  Val Riles, RN, MSN, CWOCN, CNS-BC, pager [phone]

## 2018-06-23 NOTE — Progress Notes (Signed)
Given report to ICU nurse.

## 2018-06-23 NOTE — Progress Notes (Signed)
Due to continue being tachypneic with labor breathing called RT for breathing treatment, after breathing treatment still no improvement so RT called RAPID Response team. Same time pt has fever 101.2, Internal medicine team ordered tylenol suppository and bolus 575ml NS and order for  tranfer to ICU.

## 2018-06-23 NOTE — Progress Notes (Signed)
SLP Cancellation Note  Patient Details Name: MARQUIZ SOTELO MRN: [id number] DOB: 12/13/1960   Cancelled treatment:        Patient is now intubated. Will hold swallow evaluation until extubated.   Charlynne Cousins Ahlivia Salahuddin, MA, CCC-SLP 06/23/2018 8:52 AM

## 2018-06-23 NOTE — Progress Notes (Signed)
Spoke with pt's daughter and son [name]) twice for updates today. POA paperwork is in the chart. Son wants to be sure he is the first point of contact. Tymel Conely, Rande Brunt, RN

## 2018-06-23 NOTE — Progress Notes (Signed)
Pharmacy Antibiotic Note  [name] is a 58 y.o. male admitted on 06/21/2018 with dysarthria and left facial droop> acute CVA.  Pharmacy has been consulted for Vancomycin and Cefepime dosing for fever to 104 F. Weight is 45.7 kg  Allergy noted to vancomycin (rash).  Recently on Zyvox po per ID 04/2018 Discussed with E-link Dr. Oletta Darter, ordered to give one dose of Zyvox and clarify with ID later this morning.    Plan: Cefepime 1g IV q8h Give Zvyox 600 mg IV x1 ,  F/u with ID/CCM later this morning if okay to continue Zyvox vs other antibiotic therapy. Monitor clinical progress, renal function. Check steady state vancomycin levels per protocol if needed.     Height: 6\' 1"  (185.4 cm) Weight: 100 lb 12 oz (45.7 kg) IBW/kg (Calculated) : 79.9  Temp (24hrs), Avg:99.9 F (37.7 C), Min:98.8 F (37.1 C), Max:101.2 F (38.4 C)  Recent Labs  Lab 07/01/2018 0926 06/27/2018 2213  WBC 17.9*  --   CREATININE 0.71  --   LATICACIDVEN 2.6* 1.4    Estimated Creatinine Clearance: 64.3 mL/min (by C-G formula based on SCr of 0.71 mg/dL).    Allergies  Allergen Reactions  . Buprenorphine Hcl-Naloxone Hcl Shortness Of Breath and Other (See Comments)    "Felt like I was going to die," was  jittery, had trouble breathing, felt hot (reaction to Suboxone) PER THE NCCSRS database, the patient received #42 Suboxone films between 03/26/17 and 04/02/17 from [facility]...(??)  . Ciprofloxacin Anaphylaxis  . Morphine-Naltrexone Other (See Comments)    Seizures   . Shellfish-Derived Products Anaphylaxis, Hives and Swelling  . Benadryl [Diphenhydramine Hcl] Hives, Itching and Rash  . Fish Allergy Hives, Swelling and Rash  . Morphine And Related Other (See Comments)    Seizures  . Benadryl [Diphenhydramine]   . Ciprofloxacin   . Fish Allergy   . Morphine And Related   . Shellfish Allergy   . Suboxone [Buprenorphine Hcl-Naloxone Hcl]   . Vancomycin   . Vicodin  [Hydrocodone-Acetaminophen]   . Vancomycin Rash  . Vicodin [Hydrocodone-Acetaminophen] Nausea Only    Antimicrobials this admission: Zyvox  6/10 x1 Cefepime 6/10>>  Dose adjustments this admission:   Microbiology results: 6/10 BCx: sent 6/10  Resp Cx: sent 6/10 MRSA PCR: sent 6/10 COVID-10: sent  Thank you for allowing pharmacy to be a part of this patient's care. Nicole Cella, RPh Clinical Pharmacist Please check AMION for all [place] phone numbers After 10:00 PM, call [place] [phone] 06/23/2018 3:10 AM

## 2018-06-24 ENCOUNTER — Inpatient Hospital Stay (HOSPITAL_COMMUNITY): Payer: Medicaid Other

## 2018-06-24 ENCOUNTER — Inpatient Hospital Stay: Payer: Self-pay

## 2018-06-24 LAB — CBC
HCT: 41.9 % (ref 39.0–52.0)
Hemoglobin: 12.5 g/dL — ABNORMAL LOW (ref 13.0–17.0)
MCH: 24.2 pg — ABNORMAL LOW (ref 26.0–34.0)
MCHC: 29.8 g/dL — ABNORMAL LOW (ref 30.0–36.0)
MCV: 81.2 fL (ref 80.0–100.0)
Platelets: 204 10*3/uL (ref 150–400)
RBC: 5.16 MIL/uL (ref 4.22–5.81)
RDW: 20.5 % — ABNORMAL HIGH (ref 11.5–15.5)
WBC: 10.3 10*3/uL (ref 4.0–10.5)
nRBC: 0 % (ref 0.0–0.2)

## 2018-06-24 LAB — GLUCOSE, CAPILLARY
Glucose-Capillary: 111 mg/dL — ABNORMAL HIGH (ref 70–99)
Glucose-Capillary: 119 mg/dL — ABNORMAL HIGH (ref 70–99)
Glucose-Capillary: 123 mg/dL — ABNORMAL HIGH (ref 70–99)
Glucose-Capillary: 124 mg/dL — ABNORMAL HIGH (ref 70–99)
Glucose-Capillary: 135 mg/dL — ABNORMAL HIGH (ref 70–99)
Glucose-Capillary: 150 mg/dL — ABNORMAL HIGH (ref 70–99)
Glucose-Capillary: 153 mg/dL — ABNORMAL HIGH (ref 70–99)
Glucose-Capillary: 156 mg/dL — ABNORMAL HIGH (ref 70–99)
Glucose-Capillary: 163 mg/dL — ABNORMAL HIGH (ref 70–99)
Glucose-Capillary: 165 mg/dL — ABNORMAL HIGH (ref 70–99)
Glucose-Capillary: 168 mg/dL — ABNORMAL HIGH (ref 70–99)
Glucose-Capillary: 175 mg/dL — ABNORMAL HIGH (ref 70–99)
Glucose-Capillary: 188 mg/dL — ABNORMAL HIGH (ref 70–99)
Glucose-Capillary: 195 mg/dL — ABNORMAL HIGH (ref 70–99)
Glucose-Capillary: 202 mg/dL — ABNORMAL HIGH (ref 70–99)
Glucose-Capillary: 209 mg/dL — ABNORMAL HIGH (ref 70–99)
Glucose-Capillary: 212 mg/dL — ABNORMAL HIGH (ref 70–99)
Glucose-Capillary: 215 mg/dL — ABNORMAL HIGH (ref 70–99)
Glucose-Capillary: 216 mg/dL — ABNORMAL HIGH (ref 70–99)
Glucose-Capillary: 217 mg/dL — ABNORMAL HIGH (ref 70–99)
Glucose-Capillary: 224 mg/dL — ABNORMAL HIGH (ref 70–99)
Glucose-Capillary: 82 mg/dL (ref 70–99)
Glucose-Capillary: 84 mg/dL (ref 70–99)
Glucose-Capillary: 93 mg/dL (ref 70–99)

## 2018-06-24 LAB — POCT I-STAT 7, (LYTES, BLD GAS, ICA,H+H)
Acid-base deficit: 4 mmol/L — ABNORMAL HIGH (ref 0.0–2.0)
Bicarbonate: 20.1 mmol/L (ref 20.0–28.0)
Calcium, Ion: 1.21 mmol/L (ref 1.15–1.40)
HCT: 33 % — ABNORMAL LOW (ref 39.0–52.0)
Hemoglobin: 11.2 g/dL — ABNORMAL LOW (ref 13.0–17.0)
O2 Saturation: 99 %
Patient temperature: 100.8
Potassium: 3.4 mmol/L — ABNORMAL LOW (ref 3.5–5.1)
Sodium: 147 mmol/L — ABNORMAL HIGH (ref 135–145)
TCO2: 21 mmol/L — ABNORMAL LOW (ref 22–32)
pCO2 arterial: 36 mmHg (ref 32.0–48.0)
pH, Arterial: 7.359 (ref 7.350–7.450)
pO2, Arterial: 138 mmHg — ABNORMAL HIGH (ref 83.0–108.0)

## 2018-06-24 LAB — BASIC METABOLIC PANEL
Anion gap: 6 (ref 5–15)
BUN: 19 mg/dL (ref 6–20)
CO2: 21 mmol/L — ABNORMAL LOW (ref 22–32)
Calcium: 7.7 mg/dL — ABNORMAL LOW (ref 8.9–10.3)
Chloride: 122 mmol/L — ABNORMAL HIGH (ref 98–111)
Creatinine, Ser: 0.67 mg/dL (ref 0.61–1.24)
GFR calc Af Amer: >60 mL/min (ref >60)
GFR calc non Af Amer: >60 mL/min (ref >60)
Glucose, Bld: 133 mg/dL — ABNORMAL HIGH (ref 70–99)
Potassium: 3.8 mmol/L (ref 3.5–5.1)
Sodium: 149 mmol/L — ABNORMAL HIGH (ref 135–145)

## 2018-06-24 LAB — SODIUM
Sodium: 149 mmol/L — ABNORMAL HIGH (ref 135–145)
Sodium: 148 mmol/L — ABNORMAL HIGH (ref 135–145)
Sodium: 167 mmol/L (ref 135–145)

## 2018-06-24 LAB — MAGNESIUM
Magnesium: 2 mg/dL (ref 1.7–2.4)
Magnesium: 1.9 mg/dL (ref 1.7–2.4)

## 2018-06-24 LAB — PHOSPHORUS
Phosphorus: 2 mg/dL — ABNORMAL LOW (ref 2.5–4.6)
Phosphorus: 1.7 mg/dL — ABNORMAL LOW (ref 2.5–4.6)

## 2018-06-24 MED ORDER — SODIUM CHLORIDE 0.9% FLUSH: 10.0000 mL | INTRAVENOUS | Status: DC | PRN

## 2018-06-24 MED ORDER — FENTANYL BOLUS VIA INFUSION
50.0000 ug | INTRAVENOUS | Status: DC | PRN
  Administered 2018-06-24 – 2018-06-25 (×4): 50 ug via INTRAVENOUS
  Filled 2018-06-24: qty 50

## 2018-06-24 MED ORDER — LORAZEPAM 2 MG/ML IJ SOLN
2.0000 mg | INTRAMUSCULAR | Status: DC | PRN
  Administered 2018-06-24 – 2018-06-28 (×11): 2 mg via INTRAVENOUS
  Filled 2018-06-24 (×12): qty 1

## 2018-06-24 MED ORDER — FAMOTIDINE 40 MG/5ML PO SUSR: 20.0000 mg | Freq: Two times a day (BID) | ORAL | Status: DC

## 2018-06-24 MED ORDER — MAGNESIUM SULFATE 2 GM/50ML IV SOLN
2.0000 g | Freq: Once | INTRAVENOUS | Status: AC
  Administered 2018-06-24: 2 g via INTRAVENOUS
  Filled 2018-06-24: qty 50

## 2018-06-24 MED ORDER — SODIUM CHLORIDE 3 % IV SOLN
INTRAVENOUS | Status: DC
  Administered 2018-06-24: 80 mL/h via INTRAVENOUS
  Administered 2018-06-24: 75 mL/h via INTRAVENOUS
  Administered 2018-06-25 – 2018-06-26 (×5): 90 mL/h via INTRAVENOUS
  Administered 2018-06-26: 50 mL/h via INTRAVENOUS
  Filled 2018-06-24 (×12): qty 500

## 2018-06-24 MED ORDER — LABETALOL HCL 5 MG/ML IV SOLN
10.0000 mg | INTRAVENOUS | Status: DC | PRN
  Administered 2018-06-24 – 2018-06-26 (×3): 10 mg via INTRAVENOUS
  Filled 2018-06-24 (×4): qty 4

## 2018-06-24 MED ORDER — FAMOTIDINE 40 MG/5ML PO SUSR
20.0000 mg | Freq: Two times a day (BID) | ORAL | Status: DC
  Administered 2018-06-24 – 2018-06-27 (×6): 20 mg
  Filled 2018-06-24 (×5): qty 2.5

## 2018-06-24 MED ORDER — SODIUM CHLORIDE 0.9 % IV SOLN
1.0000 g | Freq: Three times a day (TID) | INTRAVENOUS | Status: DC
  Administered 2018-06-24 – 2018-06-27 (×8): 1 g via INTRAVENOUS
  Filled 2018-06-24 (×10): qty 1

## 2018-06-24 MED ORDER — SODIUM CHLORIDE 0.9% FLUSH
10.0000 mL | Freq: Two times a day (BID) | INTRAVENOUS | Status: DC
  Administered 2018-06-24 – 2018-06-27 (×7): 10 mL

## 2018-06-24 MED ORDER — SODIUM CHLORIDE 0.9 % IV SOLN
INTRAVENOUS | Status: DC | PRN
  Administered 2018-06-24 – 2018-06-27 (×2): 250 mL via INTRAVENOUS
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
## 2018-06-24 NOTE — Progress Notes (Signed)
Called Dr. [name] re: sodium 148. Verbal order to increase 3% to 32ml/hr. See assoc documentation.

## 2018-06-24 NOTE — Progress Notes (Signed)
Peripherally Inserted Central Catheter/Midline Placement  The IV Nurse has discussed with the patient and/or persons authorized to consent for the patient, the purpose of this procedure and the potential benefits and risks involved with this procedure.  The benefits include less needle sticks, lab draws from the catheter, and the patient may be discharged home with the catheter. Risks include, but not limited to, infection, bleeding, blood clot (thrombus formation), and puncture of an artery; nerve damage and irregular heartbeat and possibility to perform a PICC exchange if needed/ordered by physician.  Alternatives to this procedure were also discussed.  Bard Power PICC patient education guide, fact sheet on infection prevention and patient information card has been provided to patient /or left at bedside.    PICC/Midline Placement Documentation    Consent obtained via telephone with son    [name] 06/24/2018, 12:47 PM
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
## 2018-06-24 NOTE — Progress Notes (Signed)
SLP Cancellation Note  Patient Details Name: [name] MRN: [id number] DOB: 09-Mar-1960   Cancelled treatment:       Reason Eval/Treat Not Completed: Medical issues which prohibited therapy. Patient with medical decline. Family in patient's room and per RN, have made him DNR with plans for full comfort measures. SLP will s/o at this time but please reorder if patient's status changes. Thank you!   Nadara Mode Tarrell 06/24/2018, 10:32 AM   Sonia Baller, MA, CCC-SLP Speech Therapy [facility] Pager: [phone]

## 2018-06-24 NOTE — Progress Notes (Addendum)
SBP <200 per Dr. [name], aware of tachycardia

## 2018-06-24 NOTE — ED Provider Notes (Signed)
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 DATA:  Stroke, follow-up. EXAM: CT HEAD WITHOUT CONTRAST TECHNIQUE: Contiguous axial images were obtained from the base of the skull through the vertex without intravenous contrast. COMPARISON:  MRI brain 06/23/2018. FINDINGS: Brain: Large right MCA distribution infarct is again noted. There is diffuse hypoattenuation with some sparing of the anterior right frontal lobe and anterior basal ganglia. Inferior temporal and posterior parietal lobes are spared. No significant hemorrhage is present. There is diffuse effacement of the sulci. 1-2 mm midline shift is present. Remote left temporal and insular ribbon infarcts are again seen. Most left parietal lobe infarct is noted. Brainstem and cerebellum are within normal limits. Vascular: Extensive vascular calcifications are again noted. Skull: Calvarium is intact. No focal lytic or blastic lesions are present. No significant extracranial  lesions are present. Sinuses/Orbits: The paranasal sinuses and mastoid air cells are clear. The globes and orbits are within normal limits. IMPRESSION: 1. Expected evolution of large right MCA territory infarct without evidence for significant hemorrhagic conversion. 2. Remote left temporal, insular, and parietal infarcts. 3. Extensive atherosclerotic calcifications consistent with known bilateral ICA occlusions. Electronically Signed   By: San Morelle M.D.   On: 06/24/2018 04:36   Mr Brain Wo Contrast  Result Date: 06/23/2018 CLINICAL DATA:  Acute presentation with left-sided weakness and left facial droop. Chronic left ICA occlusion. New, presumably acute right ICA occlusion. Right M2 embolus. EXAM: MRI HEAD WITHOUT CONTRAST TECHNIQUE: Multiplanar, multiecho pulse sequences of the brain and surrounding structures were obtained without intravenous contrast. COMPARISON:  CT studies done yesterday. FINDINGS: Brain: MRI shows acute infarction throughout the majority of the right MCA territory including the basal ganglia, insular region, lateral frontal lobe and frontoparietal region. The area of involvement is much larger than  was suggested by perfusion imaging yesterday. There is early brain swelling. There are petechial blood products in the deep insula and frontoparietal junction region but no frank hematoma. The patient also demonstrates some acute punctate watershed infarctions in the left hemispheric stenting from front to back. No large confluent infarction on the left. Chronic infarction in the left insula, frontal operculum and parietal lobe as seen previously. No hydrocephalus. No extra-axial collection. Vascular: Abnormal flow at the internal carotid arteries in the skull base region. Vertebrobasilar flow is present. Skull and upper cervical spine: Negative Sinuses/Orbits: Clear/normal Other: None IMPRESSION: Acute infarction in the complete right MCA distribution, a much larger region of  involvement than was demonstrated at CT perfusion yesterday. Early swelling. Petechial blood products without frank hematoma. Class 1 a HI 1 Acute punctate watershed infarctions in the left hemisphere running from front to back. Electronically Signed   By: Nelson Chimes M.D.   On: 06/23/2018 14:58   Dg Chest Port 1 View  Result Date: 06/23/2018 CLINICAL DATA:  58 year old male intubated, enteric tube placement. Stroke, large vessel occlusion. EXAM: PORTABLE CHEST 1 VIEW COMPARISON:  07/12/2018 and earlier. FINDINGS: Portable AP semi upright view at 0221 hours. Endotracheal tube tip at the level the clavicles. Enteric tube courses to the abdomen, side hole the level of the gastric body. Larger lung volumes. Mediastinal contours remain normal. Allowing for portable technique the lungs are clear. No pneumothorax. Partially visible cervical ACDF. Paucity of bowel gas. Chronic left clavicle and rib fractures. IMPRESSION: 1. ET tube and enteric tube in good position. 2. No acute cardiopulmonary abnormality. Electronically Signed   By: Genevie Ann M.D.   On: 06/23/2018 03:34   Dg Chest Port 1 View  Result Date: 06/21/2018 CLINICAL DATA:  Stroke-like symptoms EXAM: PORTABLE CHEST 1 VIEW COMPARISON:  06/14/2018, 05/14/2018 FINDINGS: No acute opacity or pleural effusion. Stable cardiomediastinal silhouette. No pneumothorax. Old left upper rib fractures. Aortic atherosclerosis. IMPRESSION: No active disease. Electronically Signed   By: Donavan Foil M.D.   On: 07/04/2018 16:12   [place] Ekg Site Rite  Result Date: 06/24/2018 If Site Rite image not attached, placement could not be confirmed due to current cardiac rhythm.  [place] Ekg Site Rite  Result Date: 06/24/2018 If Site Rite image not attached, placement could not be confirmed due to current cardiac rhythm.  [place] Ekg Site Rite  Result Date: 06/23/2018 If Site Rite image not attached, placement could not be confirmed due to current cardiac  rhythm.   Procedures Procedures (including critical care time)  Medications Ordered in ED Medications  thiamine (VITAMIN B-1) tablet 100 mg ( Oral See Alternative 06/24/18 0958)    Or  thiamine (B-1) injection 100 mg (100 mg Intravenous Given 06/24/18 0958)  albuterol (PROVENTIL) (2.5 MG/3ML) 0.083% nebulizer solution 2.5 mg (2.5 mg Nebulization Given 07/03/2018 2347)  mometasone-formoterol (DULERA) 200-5 MCG/ACT inhaler 2 puff (2 puffs Inhalation Not Given 06/24/18 0846)  nicotine (NICODERM CQ - dosed in mg/24 hours) patch 21 mg (21 mg Transdermal Patch Applied 06/24/18 1000)  senna-docusate (Senokot-S) tablet 1 tablet (has no administration in time range)  promethazine (PHENERGAN) tablet 12.5 mg (has no administration in time range)  heparin injection 5,000 Units (5,000 Units Subcutaneous Given 06/24/18 1434)  acetaminophen (TYLENOL) suppository 650 mg (650 mg Rectal Given 06/23/18 0100)  fentaNYL 2562mg in NS 2558m(1030mml) infusion-PREMIX (0 mcg/hr Intravenous Duplicate 06/23/59/09060450.9 %  sodium chloride infusion ( Intravenous Stopped 06/24/18 1154)  chlorhexidine gluconate (MEDLINE KIT) (PERIDEX) 0.12 %  solution 15 mL (15 mLs Mouth Rinse Given 06/24/18 0757)  MEDLINE mouth rinse (15 mLs Mouth Rinse Given 06/24/18 1446)  acetaminophen (TYLENOL) tablet 650 mg ( Oral See Alternative 06/24/18 1430)    Or  acetaminophen (TYLENOL) solution 650 mg (650 mg Per Tube Given 06/24/18 1430)    Or  acetaminophen (TYLENOL) suppository 650 mg ( Rectal See Alternative 06/24/18 1430)  busPIRone (BUSPAR) tablet 30 mg ( Oral See Alternative 06/24/18 0958)    Or  busPIRone (BUSPAR) tablet 30 mg (30 mg Per Tube Given 06/24/18 0958)  aspirin chewable tablet 81 mg (81 mg Per Tube Given 06/24/18 0958)  clopidogrel (PLAVIX) tablet 75 mg (75 mg Per Tube Given 06/24/18 0958)  feeding supplement (PRO-STAT SUGAR FREE 64) liquid 30 mL (30 mLs Per Tube Given 06/24/18 0957)  feeding supplement (OSMOLITE 1.5 CAL) liquid 1,000  mL ( Per Tube Rate/Dose Verify 06/23/18 2000)  insulin regular, human (MYXREDLIN) 100 units/ 100 mL infusion (0 Units/hr Intravenous Hold 06/24/18 1430)  Chlorhexidine Gluconate Cloth 2 % PADS 6 each (0 each Topical Duplicate 0/62/37 6283)  0.9 %  sodium chloride infusion (250 mLs Intravenous Not Given 06/23/18 1307)  sodium chloride (hypertonic) 3 % solution ( Intravenous Rate/Dose Verify 06/24/18 1200)  labetalol (NORMODYNE) injection 10 mg (has no administration in time range)  fentaNYL (SUBLIMAZE) bolus via infusion 50 mcg (has no administration in time range)  LORazepam (ATIVAN) injection 2 mg (has no administration in time range)  sodium chloride flush (NS) 0.9 % injection 10-40 mL (10 mLs Intracatheter Given 06/24/18 1400)  sodium chloride flush (NS) 0.9 % injection 10-40 mL (has no administration in time range)  ceFEPIme (MAXIPIME) 1 g in sodium chloride 0.9 % 100 mL IVPB (has no administration in time range)  famotidine (PEPCID) 40 MG/5ML suspension 20 mg (has no administration in time range)  LORazepam (ATIVAN) injection 2 mg (2 mg Intravenous Given 06/15/2018 1245)  iohexol (OMNIPAQUE) 350 MG/ML injection 100 mL (100 mLs Intravenous Contrast Given 06/18/2018 1314)  aspirin suppository 300 mg (300 mg Rectal Given 07/07/2018 1852)  sodium chloride 0.9 % bolus 500 mL (500 mLs Intravenous New Bag/Given 06/23/18 0110)  midazolam (VERSED) injection 2 mg (2 mg Intravenous Given During Downtime 06/23/18 0204)  etomidate (AMIDATE) injection 20 mg (20 mg Intravenous Given During Downtime 06/23/18 0205)  fentaNYL (SUBLIMAZE) injection 100 mcg (100 mcg Intravenous Given 06/23/18 0208)  linezolid (ZYVOX) IVPB 600 mg ( Intravenous Stopped 06/23/18 0644)  sodium chloride 0.9 % bolus 500 mL ( Intravenous Stopped 06/23/18 1255)  magnesium sulfate IVPB 1 g 100 mL ( Intravenous Stopped 06/23/18 1758)     Initial Impression / Assessment and Plan / ED Course  I have reviewed the triage vital signs and the nursing  notes.  Pertinent labs & imaging results that were available during my care of the patient were reviewed by me and considered in my medical decision making (see chart for details).        Patient will be admitted to the hospital by the [facility] hospitalist service.  The patient was seen by neurology as well.  The patient is had a very large stroke that is affecting him.  Patient has become agitated and will give sedation for this.  Final Clinical Impressions(s) / ED Diagnoses   Final diagnoses:  Cerebrovascular accident (CVA) due to occlusion of cerebral artery [facility])  Dyspnea    ED Discharge Orders    None       Dalia Heading, PA-C 06/24/18 1451  Little, Wenda Overland, MD 06/26/18 2212

## 2018-06-24 NOTE — Progress Notes (Signed)
OT Cancellation Note  Patient Details Name: FRANCISCO OSTROVSKY MRN: [id number] DOB: 05-31-1960   Cancelled Treatment:    Reason Eval/Treat Not Completed: Patient not medically ready.  Pt with continued neuro decline.  He is now DNR and family is meeting with family.   Will monitor for appropriateness of OT.  Lucille Passy, OTR/L Acute Rehabilitation Services Pager [phone] Office [phone]   Lucille Passy M 06/24/2018, 10:04 AM

## 2018-06-24 NOTE — Progress Notes (Signed)
PT Cancellation Note  Patient Details Name: DAOUD LOBUE MRN: [id number] DOB: [birth year]   Cancelled Treatment:    Reason Eval/Treat Not Completed: Patient not medically ready  Pt with continued neuro decline.  He is now DNR and family is meeting with palliative.    Ellamae Sia, PT, DPT Acute Rehabilitation Services Pager [phone] Office [phone]    Willy Eddy 06/24/2018, 11:39 AM

## 2018-06-25 DIAGNOSIS — I6523 Occlusion and stenosis of bilateral carotid arteries: Secondary | ICD-10-CM

## 2018-06-25 DIAGNOSIS — E1169 Type 2 diabetes mellitus with other specified complication: Secondary | ICD-10-CM

## 2018-06-25 DIAGNOSIS — R509 Fever, unspecified: Secondary | ICD-10-CM

## 2018-06-25 DIAGNOSIS — Z7189 Other specified counseling: Secondary | ICD-10-CM

## 2018-06-25 DIAGNOSIS — Z515 Encounter for palliative care: Secondary | ICD-10-CM

## 2018-06-25 DIAGNOSIS — F1410 Cocaine abuse, uncomplicated: Secondary | ICD-10-CM

## 2018-06-25 LAB — CBC WITH DIFFERENTIAL/PLATELET
Abs Immature Granulocytes: 0.07 10*3/uL (ref 0.00–0.07)
Basophils Absolute: 0 10*3/uL (ref 0.0–0.1)
Basophils Relative: 0 %
Eosinophils Absolute: 0.2 10*3/uL (ref 0.0–0.5)
Eosinophils Relative: 2 %
HCT: 36.4 % — ABNORMAL LOW (ref 39.0–52.0)
Hemoglobin: 11 g/dL — ABNORMAL LOW (ref 13.0–17.0)
Immature Granulocytes: 1 %
Lymphocytes Relative: 19 %
Lymphs Abs: 1.4 10*3/uL (ref 0.7–4.0)
MCH: 24.5 pg — ABNORMAL LOW (ref 26.0–34.0)
MCHC: 30.2 g/dL (ref 30.0–36.0)
MCV: 81.1 fL (ref 80.0–100.0)
Monocytes Absolute: 0.3 10*3/uL (ref 0.1–1.0)
Monocytes Relative: 4 %
Neutro Abs: 5.3 10*3/uL (ref 1.7–7.7)
Neutrophils Relative %: 74 %
Platelets: 195 10*3/uL (ref 150–400)
RBC: 4.49 MIL/uL (ref 4.22–5.81)
RDW: 20.5 % — ABNORMAL HIGH (ref 11.5–15.5)
WBC: 7.1 10*3/uL (ref 4.0–10.5)
nRBC: 0 % (ref 0.0–0.2)

## 2018-06-25 LAB — BASIC METABOLIC PANEL
Anion gap: 7 (ref 5–15)
BUN: 13 mg/dL (ref 6–20)
CO2: 22 mmol/L (ref 22–32)
Calcium: 7.8 mg/dL — ABNORMAL LOW (ref 8.9–10.3)
Chloride: 122 mmol/L — ABNORMAL HIGH (ref 98–111)
Creatinine, Ser: 0.64 mg/dL (ref 0.61–1.24)
GFR calc Af Amer: >60 mL/min (ref >60)
GFR calc non Af Amer: >60 mL/min (ref >60)
Glucose, Bld: 151 mg/dL — ABNORMAL HIGH (ref 70–99)
Potassium: 3.5 mmol/L (ref 3.5–5.1)
Sodium: 151 mmol/L — ABNORMAL HIGH (ref 135–145)

## 2018-06-25 LAB — GLUCOSE, CAPILLARY
Glucose-Capillary: 108 mg/dL — ABNORMAL HIGH (ref 70–99)
Glucose-Capillary: 109 mg/dL — ABNORMAL HIGH (ref 70–99)
Glucose-Capillary: 139 mg/dL — ABNORMAL HIGH (ref 70–99)
Glucose-Capillary: 140 mg/dL — ABNORMAL HIGH (ref 70–99)
Glucose-Capillary: 151 mg/dL — ABNORMAL HIGH (ref 70–99)
Glucose-Capillary: 154 mg/dL — ABNORMAL HIGH (ref 70–99)
Glucose-Capillary: 156 mg/dL — ABNORMAL HIGH (ref 70–99)
Glucose-Capillary: 160 mg/dL — ABNORMAL HIGH (ref 70–99)
Glucose-Capillary: 167 mg/dL — ABNORMAL HIGH (ref 70–99)
Glucose-Capillary: 173 mg/dL — ABNORMAL HIGH (ref 70–99)
Glucose-Capillary: 179 mg/dL — ABNORMAL HIGH (ref 70–99)
Glucose-Capillary: 199 mg/dL — ABNORMAL HIGH (ref 70–99)
Glucose-Capillary: 93 mg/dL (ref 70–99)

## 2018-06-25 LAB — SODIUM
Sodium: 149 mmol/L — ABNORMAL HIGH (ref 135–145)
Sodium: 149 mmol/L — ABNORMAL HIGH (ref 135–145)
Sodium: 151 mmol/L — ABNORMAL HIGH (ref 135–145)
Sodium: 151 mmol/L — ABNORMAL HIGH (ref 135–145)

## 2018-06-25 MED ORDER — ATORVASTATIN CALCIUM 80 MG PO TABS
80.0000 mg | ORAL_TABLET | Freq: Every day | ORAL | Status: DC
  Filled 2018-06-25: qty 1

## 2018-06-25 MED ORDER — ATORVASTATIN CALCIUM 80 MG PO TABS
80.0000 mg | ORAL_TABLET | Freq: Every day | ORAL | Status: DC
  Administered 2018-06-25 – 2018-06-26 (×2): 80 mg
  Filled 2018-06-25: qty 1

## 2018-06-25 MED ORDER — INSULIN GLARGINE 100 UNIT/ML ~~LOC~~ SOLN
10.0000 [IU] | Freq: Every day | SUBCUTANEOUS | Status: DC
  Administered 2018-06-25 – 2018-06-27 (×3): 10 [IU] via SUBCUTANEOUS
  Filled 2018-06-25 (×3): qty 0.1

## 2018-06-25 MED ORDER — ASPIRIN 81 MG PO CHEW
324.0000 mg | CHEWABLE_TABLET | Freq: Every day | ORAL | Status: DC
  Administered 2018-06-26 – 2018-06-27 (×2): 324 mg
  Filled 2018-06-25 (×2): qty 4

## 2018-06-25 MED ORDER — INSULIN ASPART 100 UNIT/ML ~~LOC~~ SOLN
0.0000 [IU] | SUBCUTANEOUS | Status: DC
  Administered 2018-06-25 (×3): 3 [IU] via SUBCUTANEOUS
  Administered 2018-06-26: 2 [IU] via SUBCUTANEOUS
  Administered 2018-06-26 – 2018-06-27 (×3): 3 [IU] via SUBCUTANEOUS
  Administered 2018-06-27 (×2): 2 [IU] via SUBCUTANEOUS
  Administered 2018-06-27 (×2): 3 [IU] via SUBCUTANEOUS
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
## 2018-06-25 NOTE — Progress Notes (Signed)
NAME:  JACQUES FIFE, MRN:  [id number], DOB:  07-23-1960, LOS: 3 ADMISSION DATE:  06/26/2018, CONSULTATION DATE:  6/11 REFERRING MD:  Laural Golden, CHIEF COMPLAINT:  Confusion   Brief History   58 y/o male admitted 6/10 with R ICA CVA, after initial evaluation thought not to be an acute infarct.  Intubated for airway potection.    Past Medical History  COPD GERD Depression DM2 Hep C HTN Hyperlipidemia   Significant Hospital Events   Admission 6/10 Intubated 6/11  Consults:  Neurology  Procedures:  6/11 ETT >  6/12 PICC >   Significant Diagnostic Tests:  6/12 CT head> expected evolution of R MCA infarct, no hemorrhage 6/11 MRI brain > large R MCA infarct 6/11 EEG >  6/10 CT angiogram> Acute R ICA occlusion, chronic L ICA occlusion 6/10 CT head > Acute R MCA infarct  Micro Data:  6/10 blood >  6/10 SARS-COV2> negative 6/11 SARS-COV2> negative 6/11 resp culture >    Antimicrobials:  6/10 Linezolid x1 6/10 cefepime >   Interim history/subjective:  Neurology discussing code status with family> changed to DNR yesterday, discussing further goals of care  Objective   Blood pressure (!) 193/97, pulse (!) 103, temperature 99.9 F (37.7 C), resp. rate (!) 22, height 6\' 1"  (1.854 m), weight 63.6 kg, SpO2 100 %.    Vent Mode: PRVC FiO2 (%):  [50 %] 50 % Set Rate:  [18 bmp] 18 bmp Vt Set:  [630 mL] 630 mL PEEP:  [5 cmH20] 5 cmH20 Plateau Pressure:  [10 cmH20-19 cmH20] 16 cmH20   Intake/Output Summary (Last 24 hours) at 06/25/2018 0816 Last data filed at 06/25/2018 0500 Gross per 24 hour  Intake 3231.98 ml  Output 3675 ml  Net -443.02 ml   Filed Weights   06/23/18 0432 06/24/18 0500 06/25/18 0500  Weight: 54.9 kg 60.6 kg 63.6 kg    Examination: General:  In bed on vent HENT: NCAT ETT in place PULM: CTA B, vent supported breathing CV: RRR, no mgr GI: BS+, soft, nontender MSK: normal bulk and tone Neuro: drowsy, moving R leg some   Resolved Hospital  Problem list     Assessment & Plan:  Acute respiratory failure with hypoxemia due to inability to protect airway Probable COPD Full mechanical vent support VAP prevention Daily WUA/SBT Discuss extubation with neurology after they have discussed goals of care with family  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 with large area of infarct Some brain edema Plavix per stroke service Hypertonic saline per stroke service  Hyperglycemia Change insulin infusion to glargine and SSI  History of seizures and EtOH abuse F/u results  Acute bronchitis cefepime   Best practice:  Diet: tube feeding  Pain/Anxiety/Delirium protocol (if indicated): RASS target -2 VAP protocol (if indicated): yes DVT prophylaxis: subcutaneous heparin GI prophylaxis: pepcid Glucose control: IV insulin  Mobility: bed rest Code Status: full Family Communication: daughter updated 6/12 by stroke service Disposition: ICU  Labs   CBC: Recent Labs  Lab 07/05/2018 0926  06/23/18 0413 06/23/18 0546 06/24/18 0237 06/24/18 0444 06/25/18 0520  WBC 17.9*  --   --  21.9*  --  10.3 7.1  NEUTROABS 13.5*  --   --   --   --   --  5.3  HGB 15.3   < > 16.3 15.5 11.2* 12.5* 11.0*  HCT 49.6   < > 48.0 49.5 33.0* 41.9 36.4*  MCV 78.0*  --   --  78.4*  --  81.2 81.1  PLT 294  --   --  261  --  204 195   < > = values in this interval not displayed.    Basic Metabolic Panel: Recent Labs  Lab 06/21/2018 0926  06/23/18 0413 06/23/18 0546 06/23/18 0943 06/23/18 1703  06/24/18 0237 06/24/18 0444 06/24/18 1053 06/24/18 1625 06/24/18 2238 06/24/18 2327 06/25/18 0520  NA 138   < > 136 139  --  142   < > 147* 149* 149* 148* 167* 149* 151*  K 4.3   < > 4.3 4.1  --   --   --  3.4* 3.8  --   --   --   --  3.5  CL 99  --   --  103  --   --   --   --  122*  --   --   --   --  122*  CO2 27  --   --  23  --   --   --   --  21*  --   --   --   --  22  GLUCOSE 227*  --   --  270*  --   --   --   --   133*  --   --   --   --  151*  BUN 17  --   --  24*  --   --   --   --  19  --   --   --   --  13  CREATININE 0.71  --   --  1.16  --   --   --   --  0.67  --   --   --   --  0.64  CALCIUM 9.4  --   --  8.9  --   --   --   --  7.7*  --   --   --   --  7.8*  MG  --   --   --   --  1.9 1.7  --   --  2.0  --  1.9  --   --   --   PHOS  --   --   --   --  4.1 3.5  --   --  2.0*  --  1.7*  --   --   --    < > = values in this interval not displayed.   GFR: Estimated Creatinine Clearance: 89.4 mL/min (by C-G formula based on SCr of 0.64 mg/dL). Recent Labs  Lab 07/05/2018 0926 07/10/2018 2213 06/23/18 0546 06/24/18 0444 06/25/18 0520  WBC 17.9*  --  21.9* 10.3 7.1  LATICACIDVEN 2.6* 1.4  --   --   --     Liver Function Tests: Recent Labs  Lab 06/27/2018 0926  AST 16  ALT 22  ALKPHOS 109  BILITOT 0.3  PROT 6.7  ALBUMIN 3.0*   No results for input(s): LIPASE, AMYLASE in the last 168 hours. Recent Labs  Lab 06/30/2018 0926  AMMONIA 45*    ABG    Component Value Date/Time   PHART 7.359 06/24/2018 0237   PCO2ART 36.0 06/24/2018 0237   PO2ART 138.0 (H) 06/24/2018 0237   HCO3 20.1 06/24/2018 0237   TCO2 21 (L) 06/24/2018 0237   ACIDBASEDEF 4.0 (H) 06/24/2018 0237   O2SAT 99.0 06/24/2018 0237     Coagulation Profile: No results for input(s): INR, PROTIME in the last 168 hours.  Cardiac  Enzymes: Recent Labs  Lab 06/21/2018 0927  CKTOTAL 52    HbA1C: HbA1c, POC (prediabetic range)  Date/Time Value Ref Range Status  08/30/2017 02:00 PM 5.8 5.7 - 6.4 % Final   Hgb A1c MFr Bld  Date/Time Value Ref Range Status  06/23/2018 05:46 AM 8.3 (H) 4.8 - 5.6 % Final    Comment:    (NOTE) Pre diabetes:          5.7%-6.4% Diabetes:              >6.4% Glycemic control for   <7.0% adults with diabetes   03/17/2018 04:38 AM 7.3 (H) 4.8 - 5.6 % Final    Comment:    (NOTE) Pre diabetes:          5.7%-6.4% Diabetes:              >6.4% Glycemic control for   <7.0% adults with  diabetes     CBG: Recent Labs  Lab 06/25/18 0206 06/25/18 0311 06/25/18 0427 06/25/18 0522 06/25/18 0652  GLUCAP 109* 160* 179* 140* 151*     Critical care time: 32 minutes    Roselie Awkward, MD [facility] Pager: [phone] Cell: [phone] If no response, call [phone]

## 2018-06-25 NOTE — Progress Notes (Signed)
PT Cancellation Note  Patient Details Name: [name] MRN: [id number] DOB: 05/07/1960   Cancelled Treatment:    Reason Eval/Treat Not Completed: Patient not medically ready. Per chart review, pt family leaning more towards comfort care. Per RN, pt remains unresponsive and not appropriate for therapy at this time. PT signing off, please reconsult if needs change.  Ellamae Sia, PT, DPT Acute Rehabilitation Services Pager [phone] Office [phone]    Willy Eddy 06/25/2018, 12:39 PM

## 2018-06-25 NOTE — Progress Notes (Signed)
Spoke to pt's son, Javel Hersh [phone]. FU call to arrange appt for palliative medicine to meet with 2 sons and dtr to address further GOC. [name] shares with me that he and his sister are leaning towards comfort care but his brother is struggling . Due to work constraints, family unable to meet until 1500 on Mon. Will have PM team member call [name] in am on Mon to arrange place to meet and confirm time  Thank you, Romona Curls, NP Palliative Medicine [place] note

## 2018-06-26 ENCOUNTER — Inpatient Hospital Stay (HOSPITAL_COMMUNITY): Payer: Medicaid Other

## 2018-06-26 DIAGNOSIS — J9600 Acute respiratory failure, unspecified whether with hypoxia or hypercapnia: Secondary | ICD-10-CM

## 2018-06-26 DIAGNOSIS — Z01818 Encounter for other preprocedural examination: Secondary | ICD-10-CM

## 2018-06-26 DIAGNOSIS — Z87898 Personal history of other specified conditions: Secondary | ICD-10-CM

## 2018-06-26 DIAGNOSIS — G936 Cerebral edema: Secondary | ICD-10-CM

## 2018-06-26 LAB — GLUCOSE, CAPILLARY
Glucose-Capillary: 116 mg/dL — ABNORMAL HIGH (ref 70–99)
Glucose-Capillary: 121 mg/dL — ABNORMAL HIGH (ref 70–99)
Glucose-Capillary: 128 mg/dL — ABNORMAL HIGH (ref 70–99)
Glucose-Capillary: 151 mg/dL — ABNORMAL HIGH (ref 70–99)
Glucose-Capillary: 196 mg/dL — ABNORMAL HIGH (ref 70–99)
Glucose-Capillary: 84 mg/dL (ref 70–99)

## 2018-06-26 LAB — CBC WITH DIFFERENTIAL/PLATELET
Abs Immature Granulocytes: 0.06 10*3/uL (ref 0.00–0.07)
Basophils Absolute: 0 10*3/uL (ref 0.0–0.1)
Basophils Relative: 0 %
Eosinophils Absolute: 0.2 10*3/uL (ref 0.0–0.5)
Eosinophils Relative: 2 %
HCT: 37.1 % — ABNORMAL LOW (ref 39.0–52.0)
Hemoglobin: 11.1 g/dL — ABNORMAL LOW (ref 13.0–17.0)
Immature Granulocytes: 1 %
Lymphocytes Relative: 19 %
Lymphs Abs: 1.3 10*3/uL (ref 0.7–4.0)
MCH: 24.7 pg — ABNORMAL LOW (ref 26.0–34.0)
MCHC: 29.9 g/dL — ABNORMAL LOW (ref 30.0–36.0)
MCV: 82.4 fL (ref 80.0–100.0)
Monocytes Absolute: 0.3 10*3/uL (ref 0.1–1.0)
Monocytes Relative: 5 %
Neutro Abs: 5 10*3/uL (ref 1.7–7.7)
Neutrophils Relative %: 73 %
Platelets: 211 10*3/uL (ref 150–400)
RBC: 4.5 MIL/uL (ref 4.22–5.81)
RDW: 20.7 % — ABNORMAL HIGH (ref 11.5–15.5)
WBC: 6.8 10*3/uL (ref 4.0–10.5)
nRBC: 0 % (ref 0.0–0.2)

## 2018-06-26 LAB — BASIC METABOLIC PANEL
Anion gap: 5 (ref 5–15)
BUN: 15 mg/dL (ref 6–20)
CO2: 20 mmol/L — ABNORMAL LOW (ref 22–32)
Calcium: 7.5 mg/dL — ABNORMAL LOW (ref 8.9–10.3)
Chloride: 127 mmol/L — ABNORMAL HIGH (ref 98–111)
Creatinine, Ser: 0.63 mg/dL (ref 0.61–1.24)
GFR calc Af Amer: >60 mL/min (ref >60)
GFR calc non Af Amer: >60 mL/min (ref >60)
Glucose, Bld: 162 mg/dL — ABNORMAL HIGH (ref 70–99)
Potassium: 3.5 mmol/L (ref 3.5–5.1)
Sodium: 152 mmol/L — ABNORMAL HIGH (ref 135–145)

## 2018-06-26 LAB — SODIUM
Sodium: 161 mmol/L (ref 135–145)
Sodium: 152 mmol/L — ABNORMAL HIGH (ref 135–145)

## 2018-06-26 MED ORDER — SENNOSIDES-DOCUSATE SODIUM 8.6-50 MG PO TABS
1.0000 | ORAL_TABLET | Freq: Every evening | ORAL | Status: DC | PRN
  Administered 2018-06-26: 1

## 2018-06-26 MED ORDER — FENTANYL CITRATE (PF) 100 MCG/2ML IJ SOLN
25.0000 ug | INTRAMUSCULAR | Status: DC | PRN
  Administered 2018-06-26 (×2): 100 ug via INTRAVENOUS
  Administered 2018-06-26: 50 ug via INTRAVENOUS
  Administered 2018-06-27 (×5): 100 ug via INTRAVENOUS
  Filled 2018-06-26 (×8): qty 2

## 2018-06-26 MED ORDER — SODIUM CHLORIDE 23.4 % INJECTION (4 MEQ/ML) FOR IV ADMINISTRATION
80.0000 meq | Freq: Once | INTRAVENOUS | Status: AC
  Administered 2018-06-26: 80 meq via INTRAVENOUS
  Filled 2018-06-26: qty 20

## 2018-06-26 NOTE — Progress Notes (Signed)
NAME:  NATASHA PAULSON, MRN:  [id number], DOB:  [birth year], LOS: 4 ADMISSION DATE:  06/14/2018, CONSULTATION DATE:  6/11 REFERRING MD:  Laural Golden, CHIEF COMPLAINT:  Confusion   Brief History   58 y/o male admitted 6/10 with R ICA CVA, after initial evaluation thought not to be an acute infarct.  Intubated for airway potection.    Past Medical History  COPD GERD Depression DM2 Hep C HTN Hyperlipidemia   Significant Hospital Events   Admission 6/10 Intubated 6/11 6/14 - Neurology discussing code status with family> changed to DNR yesterday, discussing further goals of care  Consults:  Neurology  Procedures:  6/11 ETT >  6/12 PICC >   Significant Diagnostic Tests:  6/12 CT head> expected evolution of R MCA infarct, no hemorrhage 6/11 MRI brain > large R MCA infarct 6/11 EEG >  6/10 CT angiogram> Acute R ICA occlusion, chronic L ICA occlusion 6/10 CT head > Acute R MCA infarct  Micro Data:  6/10 blood >  6/10 SARS-COV2> negative 6/11 SARS-COV2> negative 6/11 resp culture >    Antimicrobials:  6/10 Linezolid x1 6/10 cefepime >   Interim history/subjective:   6/14 - 2 children leaning towards comfort care. One struggling with decision. Next palliative care meeting 15.00 on 06/27/2018. On 3% saline. Low grade fever + but wbc better and fever curve better since 07/11/2018. CT head yesterday with continued evolution of infarct and worsening R -> L shift 32mm  Not on pressors On prn sedation Making urine RN wants to remove foley On cooling blanket  Objective   Blood pressure (!) 189/102, pulse (!) 107, temperature 99.5 F (37.5 C), resp. rate (!) 27, height 6\' 1"  (1.854 m), weight 61.6 kg, SpO2 100 %.    Vent Mode: PRVC FiO2 (%):  [40 %-50 %] 40 % Set Rate:  [18 bmp] 18 bmp Vt Set:  [630 mL] 630 mL PEEP:  [5 cmH20] 5 cmH20 Plateau Pressure:  [16 cmH20-27 cmH20] 20 cmH20   Intake/Output Summary (Last 24 hours) at 06/26/2018 0850 Last data filed at 06/26/2018  0542 Gross per 24 hour  Intake 2976.05 ml  Output 4450 ml  Net -1473.95 ml   Filed Weights   06/24/18 0500 06/25/18 0500 06/26/18 0500  Weight: 60.6 kg 63.6 kg 61.6 kg   General Appearance:  Looks criticall ill., Emaciated. Beard + Head:  Normocephalic, without obvious abnormality, atraumatic Eyes:  PERRL - yes, conjunctiva/corneas - muddy     Ears:  Normal external ear canals, both ears Nose:  G tube - no Throat:  ETT TUBE - yes , OG tube - yes Neck:  Supple,  No enlargement/tenderness/nodules Lungs: Clear to auscultation bilaterally, Ventilator   Synchrony - yes with 40% fio2 Heart:  S1 and S2 normal, no murmur, CVP - no.  Pressors - no Abdomen:  Soft, no masses, no organomegaly Genitalia / Rectal:  Not done Extremities:  Extremities- Rt AKA - stump moves well. Left moves but weaker - skin ulcer on shin Left side + Skin:  ntact in exposed areas . Sacral area - not examined Neurologic:  Sedation - prn -> RASS - -3 . Moves all 4s - yes but left weaker. CAM-ICU - cannot test . Orientation - cannot test      LABS    PULMONARY Recent Labs  Lab 07/01/2018 0941 06/23/18 0413 06/24/18 0237  PHART  --  7.458* 7.359  PCO2ART  --  44.1 36.0  PO2ART  --  363.0* 138.0*  HCO3 32.6*  30.9* 20.1  TCO2 34* 32 21*  O2SAT 91.0 100.0 99.0    CBC Recent Labs  Lab 06/24/18 0444 06/25/18 0520 06/26/18 0440  HGB 12.5* 11.0* 11.1*  HCT 41.9 36.4* 37.1*  WBC 10.3 7.1 6.8  PLT 204 195 211    COAGULATION No results for input(s): INR in the last 168 hours.  CARDIAC  No results for input(s): TROPONINI in the last 168 hours. No results for input(s): PROBNP in the last 168 hours.   CHEMISTRY Recent Labs  Lab 06/29/2018 0926  06/23/18 0546 06/23/18 0943 06/23/18 1703  06/24/18 0444  06/24/18 1625  06/25/18 0520 06/25/18 1226 06/25/18 1651 06/25/18 2216 06/26/18 0440  NA 138   < > 139  --  142   < > 149*   < > 148*   < > 151* 149* 151* 151* 152*  K 4.3   < > 4.1  --   --     < > 3.8  --   --   --  3.5  --   --   --  3.5  CL 99  --  103  --   --   --  122*  --   --   --  122*  --   --   --  127*  CO2 27  --  23  --   --   --  21*  --   --   --  22  --   --   --  20*  GLUCOSE 227*  --  270*  --   --   --  133*  --   --   --  151*  --   --   --  162*  BUN 17  --  24*  --   --   --  19  --   --   --  13  --   --   --  15  CREATININE 0.71  --  1.16  --   --   --  0.67  --   --   --  0.64  --   --   --  0.63  CALCIUM 9.4  --  8.9  --   --   --  7.7*  --   --   --  7.8*  --   --   --  7.5*  MG  --   --   --  1.9 1.7  --  2.0  --  1.9  --   --   --   --   --   --   PHOS  --   --   --  4.1 3.5  --  2.0*  --  1.7*  --   --   --   --   --   --    < > = values in this interval not displayed.   Estimated Creatinine Clearance: 86.6 mL/min (by C-G formula based on SCr of 0.63 mg/dL).   LIVER Recent Labs  Lab 06/27/2018 0926  AST 16  ALT 22  ALKPHOS 109  BILITOT 0.3  PROT 6.7  ALBUMIN 3.0*     INFECTIOUS Recent Labs  Lab 07/03/2018 0926 06/21/2018 2213  LATICACIDVEN 2.6* 1.4     ENDOCRINE CBG (last 3)  Recent Labs    06/25/18 2313 06/26/18 0319 06/26/18 0706  GLUCAP 154* 121* 116*         IMAGING x48h  - image(s) personally visualized  -  highlighted in bold Ct Head Wo Contrast  Result Date: 06/26/2018 CLINICAL DATA:  Follow-up examination for acute stroke. EXAM: CT HEAD WITHOUT CONTRAST TECHNIQUE: Contiguous axial images were obtained from the base of the skull through the vertex without intravenous contrast. COMPARISON:  Prior CT from 06/24/2018. FINDINGS: Brain: Extensive evolving cytotoxic edema throughout the right MCA territory again seen, now more well-defined as compared to previous exam. Overall size and distribution relatively unchanged. Increasing regional mass effect with partial effacement of the right lateral ventricle. New right-to-left midline shift of 5-6 mm. No hydrocephalus or ventricular trapping at this time. Basilar cisterns remain  patent. No evidence for hemorrhagic transformation. Underlying atrophy with chronic microvascular ischemic disease again noted. Chronic left cerebral infarcts again noted. No acute intracranial hemorrhage. No other acute large vessel territory infarct. No extra-axial fluid collection. Vascular: Calcified atherosclerosis at the skull base. No new hyperdense vessel. Skull: Scalp soft tissues and calvarium within normal limits. Sinuses/Orbits: Globes orbital soft tissues demonstrate no acute finding. Paranasal sinuses are clear. No mastoid effusion. Other: Endotracheal and enteric tubes partially visualized. IMPRESSION: 1. Continued interval evolution of large acute right MCA territory infarct, with increased regional mass effect and new right-to-left midline shift of 5-6 mm. No hydrocephalus or ventricular trapping at this time. No evidence for hemorrhagic transformation. 2. No other new acute intracranial process. Electronically Signed   By: Jeannine Boga M.D.   On: 06/26/2018 06:18   [place] Ekg Site Rite  Result Date: 06/24/2018 If Site Rite image not attached, placement could not be confirmed due to current cardiac rhythm.  [place] Ekg Site Rite  Result Date: 06/24/2018 If Site Rite image not attached, placement could not be confirmed due to current cardiac rhythm.   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 with large area of infarct Some brain edema Plavix per stroke service Hypertonic saline per stroke service  Hyperglycemia Change insulin infusion  to glargine and SSI  History of seizures and EtOH abuse F/u results  Acute bronchitis cefepime   Best practice:  Diet: tube feeding  Pain/Anxiety/Delirium protocol (if indicated): RASS target -2 VAP protocol (if indicated): yes DVT prophylaxis: subcutaneous heparin GI prophylaxis: pepcid Glucose control: IV insulin  Mobility: bed rest Code Status: full Family Communication: daughter updated 6/12 by stroke service Disposition: ICU   ATTESTATION & SIGNATURE   The patient KAIDENCE CALLAWAY is critically ill with multiple organ systems failure and requires high complexity decision making for assessment and support, frequent evaluation and titration of therapies, application of advanced monitoring technologies and extensive interpretation of multiple databases.   Critical Care Time devoted to patient care services described in this note is  30  Minutes. This time reflects time of care of this signee Dr Brand Males. This critical care time does not reflect procedure time, or teaching time or supervisory time of PA/NP/Med student/Med Resident etc but could involve care discussion time     Dr. Brand Males, M.D., [facility].C.P Pulmonary and Critical Care Medicine Staff Physician [place] [facility] Pager: [phone]  [phone], If no answer or between  15:00h - 7:00h: call [phone]  06/26/2018 8:50 AM
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
## 2018-06-27 DIAGNOSIS — J9620 Acute and chronic respiratory failure, unspecified whether with hypoxia or hypercapnia: Secondary | ICD-10-CM

## 2018-06-27 DIAGNOSIS — Z978 Presence of other specified devices: Secondary | ICD-10-CM

## 2018-06-27 DIAGNOSIS — J9601 Acute respiratory failure with hypoxia: Secondary | ICD-10-CM

## 2018-06-27 DIAGNOSIS — I6350 Cerebral infarction due to unspecified occlusion or stenosis of unspecified cerebral artery: Secondary | ICD-10-CM

## 2018-06-27 DIAGNOSIS — R0609 Other forms of dyspnea: Secondary | ICD-10-CM

## 2018-06-27 LAB — HEPATIC FUNCTION PANEL
ALT: 19 U/L (ref 0–44)
AST: 22 U/L (ref 15–41)
Albumin: 1.7 g/dL — ABNORMAL LOW (ref 3.5–5.0)
Alkaline Phosphatase: 87 U/L (ref 38–126)
Bilirubin, Direct: <0.1 mg/dL (ref 0.0–0.2)
Total Bilirubin: 0.5 mg/dL (ref 0.3–1.2)
Total Protein: 5.3 g/dL — ABNORMAL LOW (ref 6.5–8.1)

## 2018-06-27 LAB — MAGNESIUM: Magnesium: 2.1 mg/dL (ref 1.7–2.4)

## 2018-06-27 LAB — CBC WITH DIFFERENTIAL/PLATELET
Abs Immature Granulocytes: 0.05 10*3/uL (ref 0.00–0.07)
Basophils Absolute: 0 10*3/uL (ref 0.0–0.1)
Basophils Relative: 0 %
Eosinophils Absolute: 0.2 10*3/uL (ref 0.0–0.5)
Eosinophils Relative: 3 %
HCT: 36.8 % — ABNORMAL LOW (ref 39.0–52.0)
Hemoglobin: 10.9 g/dL — ABNORMAL LOW (ref 13.0–17.0)
Immature Granulocytes: 1 %
Lymphocytes Relative: 25 %
Lymphs Abs: 2.1 10*3/uL (ref 0.7–4.0)
MCH: 24.3 pg — ABNORMAL LOW (ref 26.0–34.0)
MCHC: 29.6 g/dL — ABNORMAL LOW (ref 30.0–36.0)
MCV: 82 fL (ref 80.0–100.0)
Monocytes Absolute: 0.5 10*3/uL (ref 0.1–1.0)
Monocytes Relative: 5 %
Neutro Abs: 5.7 10*3/uL (ref 1.7–7.7)
Neutrophils Relative %: 66 %
Platelets: 240 10*3/uL (ref 150–400)
RBC: 4.49 MIL/uL (ref 4.22–5.81)
RDW: 20.9 % — ABNORMAL HIGH (ref 11.5–15.5)
WBC: 8.5 10*3/uL (ref 4.0–10.5)
nRBC: 0 % (ref 0.0–0.2)

## 2018-06-27 LAB — BASIC METABOLIC PANEL
Anion gap: 7 (ref 5–15)
BUN: 25 mg/dL — ABNORMAL HIGH (ref 6–20)
CO2: 21 mmol/L — ABNORMAL LOW (ref 22–32)
Calcium: 8.1 mg/dL — ABNORMAL LOW (ref 8.9–10.3)
Chloride: 122 mmol/L — ABNORMAL HIGH (ref 98–111)
Creatinine, Ser: 0.67 mg/dL (ref 0.61–1.24)
GFR calc Af Amer: >60 mL/min (ref >60)
GFR calc non Af Amer: >60 mL/min (ref >60)
Glucose, Bld: 159 mg/dL — ABNORMAL HIGH (ref 70–99)
Potassium: 3.6 mmol/L (ref 3.5–5.1)
Sodium: 150 mmol/L — ABNORMAL HIGH (ref 135–145)

## 2018-06-27 LAB — GLUCOSE, CAPILLARY
Glucose-Capillary: 166 mg/dL — ABNORMAL HIGH (ref 70–99)
Glucose-Capillary: 127 mg/dL — ABNORMAL HIGH (ref 70–99)
Glucose-Capillary: 161 mg/dL — ABNORMAL HIGH (ref 70–99)
Glucose-Capillary: 128 mg/dL — ABNORMAL HIGH (ref 70–99)

## 2018-06-27 LAB — PHOSPHORUS: Phosphorus: 2.9 mg/dL (ref 2.5–4.6)

## 2018-06-27 LAB — SODIUM
Sodium: 148 mmol/L — ABNORMAL HIGH (ref 135–145)
Sodium: 151 mmol/L — ABNORMAL HIGH (ref 135–145)
Sodium: 152 mmol/L — ABNORMAL HIGH (ref 135–145)

## 2018-06-27 MED ORDER — MORPHINE SULFATE (PF) 2 MG/ML IV SOLN
2.0000 mg | INTRAVENOUS | Status: DC | PRN
  Administered 2018-06-28: 2 mg via INTRAVENOUS
  Filled 2018-06-27: qty 1

## 2018-06-27 MED ORDER — SODIUM CHLORIDE 3 % IV SOLN
INTRAVENOUS | Status: DC
  Administered 2018-06-27: 50 mL/h via INTRAVENOUS
  Filled 2018-06-27 (×4): qty 500

## 2018-06-27 MED ORDER — MORPHINE BOLUS VIA INFUSION
5.0000 mg | INTRAVENOUS | Status: DC | PRN
  Filled 2018-06-27: qty 5

## 2018-06-27 MED ORDER — POLYVINYL ALCOHOL 1.4 % OP SOLN
1.0000 [drp] | Freq: Four times a day (QID) | OPHTHALMIC | Status: DC | PRN
  Filled 2018-06-27: qty 15

## 2018-06-27 MED ORDER — DEXTROSE 5 % IV SOLN: INTRAVENOUS | Status: DC

## 2018-06-27 MED ORDER — GLYCOPYRROLATE 0.2 MG/ML IJ SOLN: 0.2000 mg | INTRAMUSCULAR | Status: DC | PRN

## 2018-06-27 MED ORDER — GLYCOPYRROLATE 0.2 MG/ML IJ SOLN
0.2000 mg | INTRAMUSCULAR | Status: DC | PRN
  Filled 2018-06-27: qty 1

## 2018-06-27 MED ORDER — GLYCOPYRROLATE 1 MG PO TABS
1.0000 mg | ORAL_TABLET | ORAL | Status: DC | PRN
  Filled 2018-06-27: qty 1

## 2018-06-27 MED ORDER — MORPHINE 100MG IN NS 100ML (1MG/ML) PREMIX INFUSION
0.0000 mg/h | INTRAVENOUS | Status: DC
  Administered 2018-06-27: 5 mg/h via INTRAVENOUS
  Administered 2018-06-28 (×4): 20 mg/h via INTRAVENOUS
  Filled 2018-06-27 (×6): qty 100
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
## 2018-06-27 NOTE — Progress Notes (Signed)
Chaplain responded to spiritual consult for end of life. Patient's granddaughters were bedside with their "[place]."  Their mother, the patient's daughter is in waiting area. Provided ministry of presence and prayer and words of comfort.  Will refer family to incoming chaplain as it is now 5PM and patient has not yet been extubated. Rev. Tamsen Snider Pager [phone]

## 2018-06-27 NOTE — Progress Notes (Signed)
PCCM Interval Note  I was notified by Dr Loanne Drilling and Wadie Lessen NP that based on discussions between family, Palliative Care and Neurology decision has been made to transition pt to comfort with a compassionate extubation.   I will place orders in the chart so this can be done when the family is ready.    Baltazar Apo, MD, PhD 06/27/2018, 4:13 PM [facility] [phone] or if no answer [phone]

## 2018-06-27 NOTE — Progress Notes (Signed)
eLink Physician-Brief Progress Note Patient Name: [name] DOB: Mar 10, 1960 MRN: [id number]   Date of Service  06/27/2018  HPI/Events of Note  Patient on full comfort care. Request for transfer to PCU  eICU Interventions  Ok to transfer to PCU with telemetry monitor.  Family is to be informed. Discussed with RN Roselyn Reef.     Intervention Category Minor Interventions: Other:  Judd Lien 06/27/2018, 10:11 PM

## 2018-06-27 NOTE — Progress Notes (Signed)
Patient ID: [name], male   DOB: November 28, 1960, 58 y.o.   MRN: [id number]  This NP visited patient at the bedside as a follow up to last week's GOCs meeting, to meet with family (three siblings/children of patient) to continue conversation regarding diagnosis, prognosis, GOCs, EOL wishes and options.   All family members visited with patient,they were updated by Dr Erlinda Hong regarding diagnosis and prognosis and given the opportunity to explore their thoughts and feelings regarding current medical situation.    Family discussed [name] as a person and that as a man would not want to live his life in a SNF, dependant on others and per his children " not in control of his own life".  Discussed the natural trajectory at EOL.    Discussed that prognosis may be hours to days. Questions and concerns addressed  Plan of care      Focus is comfort and dignity - liberate from the ventilator tonight ( after 5 ) once all  family members are notified - symptom management to enhance comfort as he transitions at EOL    - continuous opioid gtt with boluses    - ativan for agitation    - robinul for terminal secretions  -no further artifical feeding or hydration now or in the future -no further life prolonging measures -discussed visitor policy with family (4 visitors may be allowed in waiting room, 2 visitor are allowed in the patient room at a time)  I spoke to Dr Ellison/CCM regarding the above.  This NP will not be in the hospital at this time and will need CCM to write orders  on this terminal wean/extubation.      Discussed with Dr Loanne Drilling and Elaine/bedside RN  Total time spent on the unit was 60 minutes  Greater than 50% of the time was spent in counseling and coordination of care  Wadie Lessen NP  Palliative Medicine Team Team Phone # [phone] Pager [phone]

## 2018-06-27 NOTE — Progress Notes (Signed)
Morphine gtt started, 3% gtt stopped. Family at bedside.

## 2018-06-27 NOTE — Procedures (Addendum)
History: 58 yo M with bilateral carotid occlusions  Sedation: None  Technique: This is a 21 channel routine scalp EEG performed at the bedside with bipolar and monopolar montages arranged in accordance to the international 10/20 system of electrode placement. One channel was dedicated to EKG recording.    Background: The background consists of low voltage delta activity. The anterior leads are relatively obscured by muscle artifact, but the posterior leads are visible without any clear epileptiform activity. No PDR was seen.   Photic stimulation: Physiologic driving is not performed  EEG Abnormalities: 1) Absent PDR 2) Excessive muscle artifact.  Clinical Interpretation: This EEG was limited by the presence of muscle activity, but is most consistent with a non-specific generalized cerebral dysfunction. To the extent that the EEG was visible, no epileptiform activity was seen. If there remains high concern for frontal epilepsy, could consider repeat study with paralytic or other maneuvers to reduce muscle activity.  Roland Rack, MD [facility] [phone]  If 7pm- 7am, please page neurology on call as listed in [place].

## 2018-06-27 NOTE — Progress Notes (Signed)
Nutrition Follow-up RD working remotely.  DOCUMENTATION CODES:   Underweight  INTERVENTION:   Continue Osmolite 1.5 @ 50 ml/hr 30 ml Prostat BID  Provides: 2000 kcal, 105 grams protein, and 914 ml free water.  NUTRITION DIAGNOSIS:   Increased nutrient needs related to chronic illness(COPD) as evidenced by estimated needs. Ongoing.   GOAL:   Patient will meet greater than or equal to 90% of their needs Met.   MONITOR:   Vent status, Skin, Labs, TF tolerance  REASON FOR ASSESSMENT:   Consult, Ventilator Enteral/tube feeding initiation and management  ASSESSMENT:   Pt with PMH of COPD, depression, DM, hepatitis C, HTN, HLD, PVD, s/p R AKA in 2019 who is now admitted with moderate R ICH CVA.   Family meeting planned today.   Patient is currently intubated on ventilator support MV: 18.9 L/min Temp (24hrs), Avg:99.6 F (37.6 C), Min:97.9 F (36.6 C), Max:100.8 F (38.2 C)  Medications reviewed and include:  100 mg thiamine Labs reviewed:  CBG's: [phone] Pt is positive 4.9 L; weight up 17 lb   NUTRITION - FOCUSED PHYSICAL EXAM:  Deferred   Diet Order:   Diet Order            Diet NPO time specified  Diet effective now              EDUCATION NEEDS:   No education needs have been identified at this time  Skin:  Skin Assessment: Skin Integrity Issues: Skin Integrity Issues:: Stage II Stage II: sacrum and buttocks  Last BM:  6/15  Height:   Ht Readings from Last 1 Encounters:  06/23/2018 '6\' 1"'  (1.854 m)    Weight:   Wt Readings from Last 1 Encounters:  06/27/18 62.6 kg    Ideal Body Weight:  76.9 kg  BMI:  17.36 - underweight  Estimated Nutritional Needs:   Kcal:  2018  Protein:  85-110 grams  Fluid:  >2L/day  Maylon Peppers RD, LDN, CNSC [phone] Pager [phone] After Hours Pager

## 2018-06-27 NOTE — Progress Notes (Addendum)
NAME:  [name], MRN:  [id number], DOB:  [birth year], LOS: 5 ADMISSION DATE:  06/15/2018, CONSULTATION DATE:  6/11 REFERRING MD:  Laural Golden, CHIEF COMPLAINT:  Confusion   Brief History   58 y/o male admitted 6/10 with R ICA CVA, after initial evaluation thought not to be an acute infarct.  Intubated for airway potection.    Past Medical History  COPD GERD Depression DM2 Hep C HTN Hyperlipidemia   Significant Hospital Events   Admission 6/10 Intubated 6/11 6/14 - Neurology discussing code status with family> changed to DNR yesterday, discussing further goals of care  Consults:  Neurology  Procedures:  6/11 ETT >  6/12 PICC >   Significant Diagnostic Tests:  6/12 CT head> expected evolution of R MCA infarct, no hemorrhage 6/11 MRI brain > large R MCA infarct 6/11 EEG >  6/10 CT angiogram> Acute R ICA occlusion, chronic L ICA occlusion 6/10 CT head > Acute R MCA infarct  Micro Data:  6/10 blood >  6/10 SARS-COV2> negative 6/11 SARS-COV2> negative 6/11 resp culture >    Antimicrobials:  6/10 Linezolid x1 6/10 cefepime > 6/15  Interim history/subjective:   No significant neurological change Note family mtg planned for 15:00 w PC 3% saline currently on hold > Na 150 Not currently on any sedation.  Objective   Blood pressure 108/69, pulse 77, temperature 97.9 F (36.6 C), temperature source Axillary, resp. rate 18, height 6\' 1"  (1.854 m), weight 62.6 kg, SpO2 100 %.    Vent Mode: PRVC FiO2 (%):  [40 %] 40 % Set Rate:  [18 bmp] 18 bmp Vt Set:  [630 mL] 630 mL PEEP:  [5 cmH20] 5 cmH20 Plateau Pressure:  [12 cmH20-32 cmH20] 24 cmH20   Intake/Output Summary (Last 24 hours) at 06/27/2018 0931 Last data filed at 06/27/2018 6384 Gross per 24 hour  Intake 1913.27 ml  Output 2325 ml  Net -411.73 ml   Filed Weights   06/25/18 0500 06/26/18 0500 06/27/18 0457  Weight: 63.6 kg 61.6 kg 62.6 kg   General Appearance: Acute and chronically ill-appearing Head:  Atraumatic Eyes: Pupils unequal, left is slightly larger than the right, both react Throat: ET tube and OG tube in place Neck: No lymphadenopathy, no elevated JVP Lungs: Clear bilaterally FiO2 0.40, PEEP 5, he does have a spontaneous respiratory drive Heart: Regular, no murmur Abdomen: Soft, nondistended, no apparent tenderness, positive bowel sounds Extremities: Right AKA, moving his left lower extremity and has an abrased area, no edema Skin: No rash Neurologic: Moving his left lower extremity spontaneously, other extremities with stimulation.  Question upper extremity posturing.  Did not wake or follow commands.  Appeared to try to open his eye but unable to fully do so.  Pupils unequal as above   LABS    PULMONARY Recent Labs  Lab 07/03/2018 0941 06/23/18 0413 06/24/18 0237  PHART  --  7.458* 7.359  PCO2ART  --  44.1 36.0  PO2ART  --  363.0* 138.0*  HCO3 32.6* 30.9* 20.1  TCO2 34* 32 21*  O2SAT 91.0 100.0 99.0    CBC Recent Labs  Lab 06/25/18 0520 06/26/18 0440 06/27/18 0341  HGB 11.0* 11.1* 10.9*  HCT 36.4* 37.1* 36.8*  WBC 7.1 6.8 8.5  PLT 195 211 240    COAGULATION No results for input(s): INR in the last 168 hours.  CARDIAC  No results for input(s): TROPONINI in the last 168 hours. No results for input(s): PROBNP in the last 168 hours.   CHEMISTRY  Recent Labs  Lab 06/23/18 0546 06/23/18 0943 06/23/18 1703  06/24/18 0444  06/24/18 1625  06/25/18 0520  06/26/18 0440 06/26/18 1155 06/26/18 1412 06/26/18 2348 06/27/18 0341  NA 139  --  142   < > 149*   < > 148*   < > 151*   < > 152* 161* 152* 151* 150*  K 4.1  --   --    < > 3.8  --   --   --  3.5  --  3.5  --   --   --  3.6  CL 103  --   --   --  122*  --   --   --  122*  --  127*  --   --   --  122*  CO2 23  --   --   --  21*  --   --   --  22  --  20*  --   --   --  21*  GLUCOSE 270*  --   --   --  133*  --   --   --  151*  --  162*  --   --   --  159*  BUN 24*  --   --   --  19  --   --   --   13  --  15  --   --   --  25*  CREATININE 1.16  --   --   --  0.67  --   --   --  0.64  --  0.63  --   --   --  0.67  CALCIUM 8.9  --   --   --  7.7*  --   --   --  7.8*  --  7.5*  --   --   --  8.1*  MG  --  1.9 1.7  --  2.0  --  1.9  --   --   --   --   --   --   --  2.1  PHOS  --  4.1 3.5  --  2.0*  --  1.7*  --   --   --   --   --   --   --  2.9   < > = values in this interval not displayed.   Estimated Creatinine Clearance: 88 mL/min (by C-G formula based on SCr of 0.67 mg/dL).   LIVER Recent Labs  Lab 07/03/2018 0926 06/27/18 0341  AST 16 22  ALT 22 19  ALKPHOS 109 87  BILITOT 0.3 0.5  PROT 6.7 5.3*  ALBUMIN 3.0* 1.7*     INFECTIOUS Recent Labs  Lab 06/16/2018 0926 07/01/2018 2213  LATICACIDVEN 2.6* 1.4     ENDOCRINE CBG (last 3)  Recent Labs    06/26/18 2339 06/27/18 0339 06/27/18 0818  GLUCAP 151* 166* [facility] Problem list     Assessment & Plan:  Acute respiratory failure with hypoxemia due to inability to protect airway Probable COPD Plan Continue current MV support.  May be able to do some spontaneous breathing but is not a candidate for extubation due to his neurological injury, mental status and airway protection Suspect that he will transition to comfort based care.  If not then he likely would need long-term trach, PEG, institutionalization. Chest x-ray Stop Dulera.  Continue albuterol nebs as needed  Subacute R ICA occlusion leading to MCA/ACA distribution  stroke with large area of infarct Cerebral edema Prognosis for meaningful recovery unfortunately very poor ASA, Plavix as ordered, atorvastatin as ordered Hypertonic saline to be reinitiated 6/15, discussed with Dr. Erlinda Hong  Hyperglycemia Lantus Sliding-scale insulin  History of seizures and EtOH abuse Following for any evidence seizure activity, withdrawal  Acute bronchitis Stop cefepime on 6/15 (5 days)   Best practice:  Diet: tube feeding  Pain/Anxiety/Delirium  protocol (if indicated): RASS target -1, -2 VAP protocol (if indicated): yes DVT prophylaxis: subcutaneous heparin GI prophylaxis: pepcid Glucose control: IV insulin  Mobility: bed rest Code Status: DNR Family Communication: 6/15 by stroke service, palliative care service Disposition: ICU  Independent critical care time 32 minutes  Baltazar Apo, MD, PhD 06/27/2018, 9:46 AM [facility] [phone] or if no answer [phone]

## 2018-06-27 NOTE — Progress Notes (Signed)
Patient extubated with the withdrawal of life sustaining measures protocol. RN and family at bedside. Patient resting comfortably.

## 2018-06-27 NOTE — Progress Notes (Signed)
OT Cancellation/Discharge Note  Patient Details Name: [name] MRN: [id number] DOB: Feb 20, 1960   Cancelled Treatment:    Reason Eval/Treat Not Completed: Medical issues which prohibited therapy.  Pt remains intubated and unresponsive.  Family in discussions with palliative care re: transitioning to comfort.  Pt not appropriate at this time for OT.  If he becomes appropriate, please reorder.  Lucille Passy, OTR/L Acute Rehabilitation Services Pager [phone] Office [phone]   Lucille Passy M 06/27/2018, 6:11 AM

## 2018-06-28 LAB — CULTURE, BLOOD (ROUTINE X 2)
Culture: NO GROWTH
Culture: NO GROWTH
Special Requests: ADEQUATE

## 2018-06-29 NOTE — Progress Notes (Signed)
Pt son, Gyasi Hazzard, stated that pt was married to a "[name]" (last name unknown) and phone number is [phone].

## 2018-06-29 NOTE — Progress Notes (Signed)
Wasted approximately 59ml of morphine with Marya Landry RN

## 2018-06-30 ENCOUNTER — Ambulatory Visit: Payer: Medicaid Other | Admitting: Physical Therapy

## 2018-06-30 ENCOUNTER — Ambulatory Visit: Payer: Medicaid Other | Admitting: Family Medicine

## 2018-07-04 ENCOUNTER — Ambulatory Visit: Payer: Medicaid Other | Admitting: Internal Medicine

## 2018-07-06 ENCOUNTER — Encounter: Payer: Medicaid Other | Admitting: Physical Therapy

## 2018-07-11 ENCOUNTER — Encounter: Payer: Medicaid Other | Admitting: Physical Therapy

## 2018-07-13 NOTE — Progress Notes (Signed)
Patient ID: [name], male   DOB: July 21, 1960, 58 y.o.   MRN: [id number]  This NP visited patient at the bedside for palliative medicine needs and emotional support.  Patient appears comfortable, he is unresponsive and continues on a morphine drip for end-of-life symptom management.  Son [name] is at the bedside.  He tells me that family is comfortable with the decision that focuses on comfort and quality and understands the limited prognosis.  Discussed the natural trajectory and expectations  at EOL.    Questions and concerns addressed  Plan of care      Focus is comfort and dignity - symptom management to enhance comfort as he transitions at EOL    - continuous opioid gtt with boluses    - ativan for agitation    - robinul for terminal secretions  -no further artifical feeding or hydration now or in the future -no further life prolonging measures  Total time spent on the unit was 20 minutes  Greater than 50% of the time was spent in counseling and coordination of care  Wadie Lessen NP  Palliative Medicine Team Team Phone # [phone] Pager [phone]
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
## 2018-07-13 NOTE — Social Work (Signed)
CSW acknowledging pt under comfort care measures. Will follow for disposition should hospice services or residential hospice become appropriate.   Orlin Kann H Ermina Oberman, LCSWA [place] Clinical Social Work [phone]   

## 2018-07-13 NOTE — Progress Notes (Signed)
Practitioner in to see patient. Noted tachycardia. Morphine rate increased. Will continue to minitor

## 2018-07-13 NOTE — Progress Notes (Signed)
Transferred from 4N via bed

## 2018-07-13 NOTE — Progress Notes (Signed)
NAME:  ABISHAI VIEGAS, MRN:  [id number], DOB:  04/14/1960, LOS: 6 ADMISSION DATE:  06/16/2018, CONSULTATION DATE:  6/11 REFERRING MD:  Laural Golden, CHIEF COMPLAINT:  Confusion   Brief History   58 y/o male admitted 6/10 with R ICA CVA, after initial evaluation thought not to be an acute infarct.  Intubated for airway potection.    Past Medical History  COPD GERD Depression DM2 Hep C HTN Hyperlipidemia   Significant Hospital Events   Admission 6/10 Intubated 6/11 6/14 - Neurology discussing code status with family> changed to DNR yesterday, discussing further goals of care 6/15 compassionate extubation and transition to comfort care 6/16 in 4NP on comfort care  Consults:  Neurology PCM  Procedures:  6/11 ETT > 6/15  6/12 PICC >   Significant Diagnostic Tests:  6/12 CT head> expected evolution of R MCA infarct, no hemorrhage 6/11 MRI brain > large R MCA infarct 6/11 EEG >  6/10 CT angiogram> Acute R ICA occlusion, chronic L ICA occlusion 6/10 CT head > Acute R MCA infarct  Micro Data:  6/10 blood >  6/10 SARS-COV2> negative 6/11 SARS-COV2> negative 6/11 resp culture >    Antimicrobials:  6/10 Linezolid x1 6/10 cefepime > 6/15  Interim history/subjective:  Extubated yesterday for comfort care Becoming progressively more hypoxic today, with some tachycardia   Objective   Blood pressure (!) 143/66, pulse (!) 104, temperature 99.9 F (37.7 C), temperature source Axillary, resp. rate 13, height 6\' 1"  (1.854 m), weight 62.6 kg, SpO2 (!) 88 %.    Vent Mode: PRVC FiO2 (%):  [40 %] 40 % Set Rate:  [18 bmp] 18 bmp Vt Set:  [360 mL-630 mL] 360 mL PEEP:  [5 cmH20] 5 cmH20 Plateau Pressure:  [26 cmH20] 26 cmH20   Intake/Output Summary (Last 24 hours) at 07-04-18 0859 Last data filed at 07/04/2018 0600 Gross per 24 hour  Intake 718.24 ml  Output -  Net 718.24 ml   Filed Weights   06/25/18 0500 06/26/18 0500 06/27/18 0457  Weight: 63.6 kg 61.6 kg 62.6 kg    General Appearance: Acute on chronically ill appearing adult M, NAD HEENT: NCAT, trachea midline, anicteric sclera, pink mm  Lungs: CTA. Shallow respirations, mild scalene muscle recruitment  Heart: tachycardia, s1s2 no JVD  Abdomen: soft, round, non-tender. + bowel sounds  Extremities: R AKA. No tenderness no cyanosis  Skin: clean dry warm.  Neurologic: Moving spontaneously. [place].    LABS    PULMONARY Recent Labs  Lab 06/25/2018 0941 06/23/18 0413 06/24/18 0237  PHART  --  7.458* 7.359  PCO2ART  --  44.1 36.0  PO2ART  --  363.0* 138.0*  HCO3 32.6* 30.9* 20.1  TCO2 34* 32 21*  O2SAT 91.0 100.0 99.0    CBC Recent Labs  Lab 06/25/18 0520 06/26/18 0440 06/27/18 0341  HGB 11.0* 11.1* 10.9*  HCT 36.4* 37.1* 36.8*  WBC 7.1 6.8 8.5  PLT 195 211 240    COAGULATION No results for input(s): INR in the last 168 hours.  CARDIAC  No results for input(s): TROPONINI in the last 168 hours. No results for input(s): PROBNP in the last 168 hours.   CHEMISTRY Recent Labs  Lab 06/23/18 0546 06/23/18 0943 06/23/18 1703  06/24/18 0444  06/24/18 1625  06/25/18 0520  06/26/18 0440  06/26/18 1412 06/26/18 2348 06/27/18 0341 06/27/18 0922 06/27/18 1522  NA 139  --  142   < > 149*   < > 148*   < > 151*   < >  152*   < > 152* 151* 150* 148* 152*  K 4.1  --   --    < > 3.8  --   --   --  3.5  --  3.5  --   --   --  3.6  --   --   CL 103  --   --   --  122*  --   --   --  122*  --  127*  --   --   --  122*  --   --   CO2 23  --   --   --  21*  --   --   --  22  --  20*  --   --   --  21*  --   --   GLUCOSE 270*  --   --   --  133*  --   --   --  151*  --  162*  --   --   --  159*  --   --   BUN 24*  --   --   --  19  --   --   --  13  --  15  --   --   --  25*  --   --   CREATININE 1.16  --   --   --  0.67  --   --   --  0.64  --  0.63  --   --   --  0.67  --   --   CALCIUM 8.9  --   --   --  7.7*  --   --   --  7.8*  --  7.5*  --   --   --  8.1*  --   --   MG  --  1.9 1.7  --   2.0  --  1.9  --   --   --   --   --   --   --  2.1  --   --   PHOS  --  4.1 3.5  --  2.0*  --  1.7*  --   --   --   --   --   --   --  2.9  --   --    < > = values in this interval not displayed.   Estimated Creatinine Clearance: 88 mL/min (by C-G formula based on SCr of 0.67 mg/dL).   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 Advised to please contact PCM or PCCM if continued  signs/symptoms persist after increasing morphine rate  Acute respiratory failure with hypoxemia due to inability to protect airway Probable COPD Acute Bronchitis  No further intervention, comfort care as above  Hyperglycemia No further intervention, comfort care   Best practice:  Diet: NPO Pain/Anxiety/Delirium protocol (if indicated): Morphine, fentanyl, ativan  VAP protocol (if indicated): no DVT prophylaxis: subcutaneous no, comfort care GI prophylaxis: no, comfort care Glucose control: no, comfort care Mobility: bed rest Code Status: DNR, comfort care  Family Communication: 6/16 updated at bedside  Disposition: [place] MSN,  AGACNP-BC [place] [phone] If no answer, [phone] 2018-07-14, 8:59 AM

## 2018-07-13 NOTE — Progress Notes (Signed)
Nutrition Brief Note  Chart reviewed. Pt now transitioning to comfort care.  No further nutrition interventions warranted at this time.  Please re-consult as needed.   Kaithlyn Teagle A. Cartier Mapel, RD, LDN, CDCES Registered Dietitian II Certified Diabetes Care and Education Specialist Pager: [phone] After hours Pager: [phone]  

## 2018-07-13 DEATH — deceased

## 2018-07-14 ENCOUNTER — Ambulatory Visit: Payer: Medicaid Other | Admitting: Internal Medicine

## 2018-07-22 ENCOUNTER — Telehealth: Payer: Self-pay

## 2018-07-22 NOTE — Telephone Encounter (Signed)
On 07/22/2018 Received dc from [facility].   DC is for cremation and a patient of Doctor Mannam.   DC will be taken to [facility] for signature.  On 07/26/2018 Received signed dc back from Doctor Mannam.  I called [employer] and spoke with [facility] Ms and she is going to come by and pickup the dc in the am..

## 2018-07-25 NOTE — Discharge Summary (Signed)
Physician Death Summary  Patient ID: [name] MRN: [id number] DOB/AGE: [birth year] 60 y.o.  Admit date: 06/18/2018 Discharge date: 07/08/2018  Admission Diagnoses: Altered mental status, CVA  Discharge Diagnoses:  Acute respiratory failure Cerebral edema Right ICA CVA  Discharged Condition: Deceased  Hospital Course:  58 Y/O with subacute R ICA occlusion leading to MCA/ACA distribution stroke with large area of infarct.  Noted to have cerebral edema.  Not a candidate for intervention per neurology and interventional radiology  Given his poor prognosis family discussion was held with primary team and neurology.  He was made DNR and eventually transition to comfort care.  Extubated on 6/15 and placed on morphine drip.  He passed away on 07/08/2022 with family at bedside.  Consults: Neurology  Signed: Marshell Garfinkel 07/25/2018, 10:50 AM

## 2018-08-01 ENCOUNTER — Other Ambulatory Visit: Payer: Self-pay | Admitting: Family Medicine

## 2018-08-01 DIAGNOSIS — K219 Gastro-esophageal reflux disease without esophagitis: Secondary | ICD-10-CM

## 2018-10-10 IMAGING — CR DG CHEST 2V
2 series · 2 of 2 positions shown · non-contrast
Comparison: 07/22/2014

CLINICAL DATA: Shortness of Breath

EXAM:
CHEST  2 VIEW

[w chest pa]
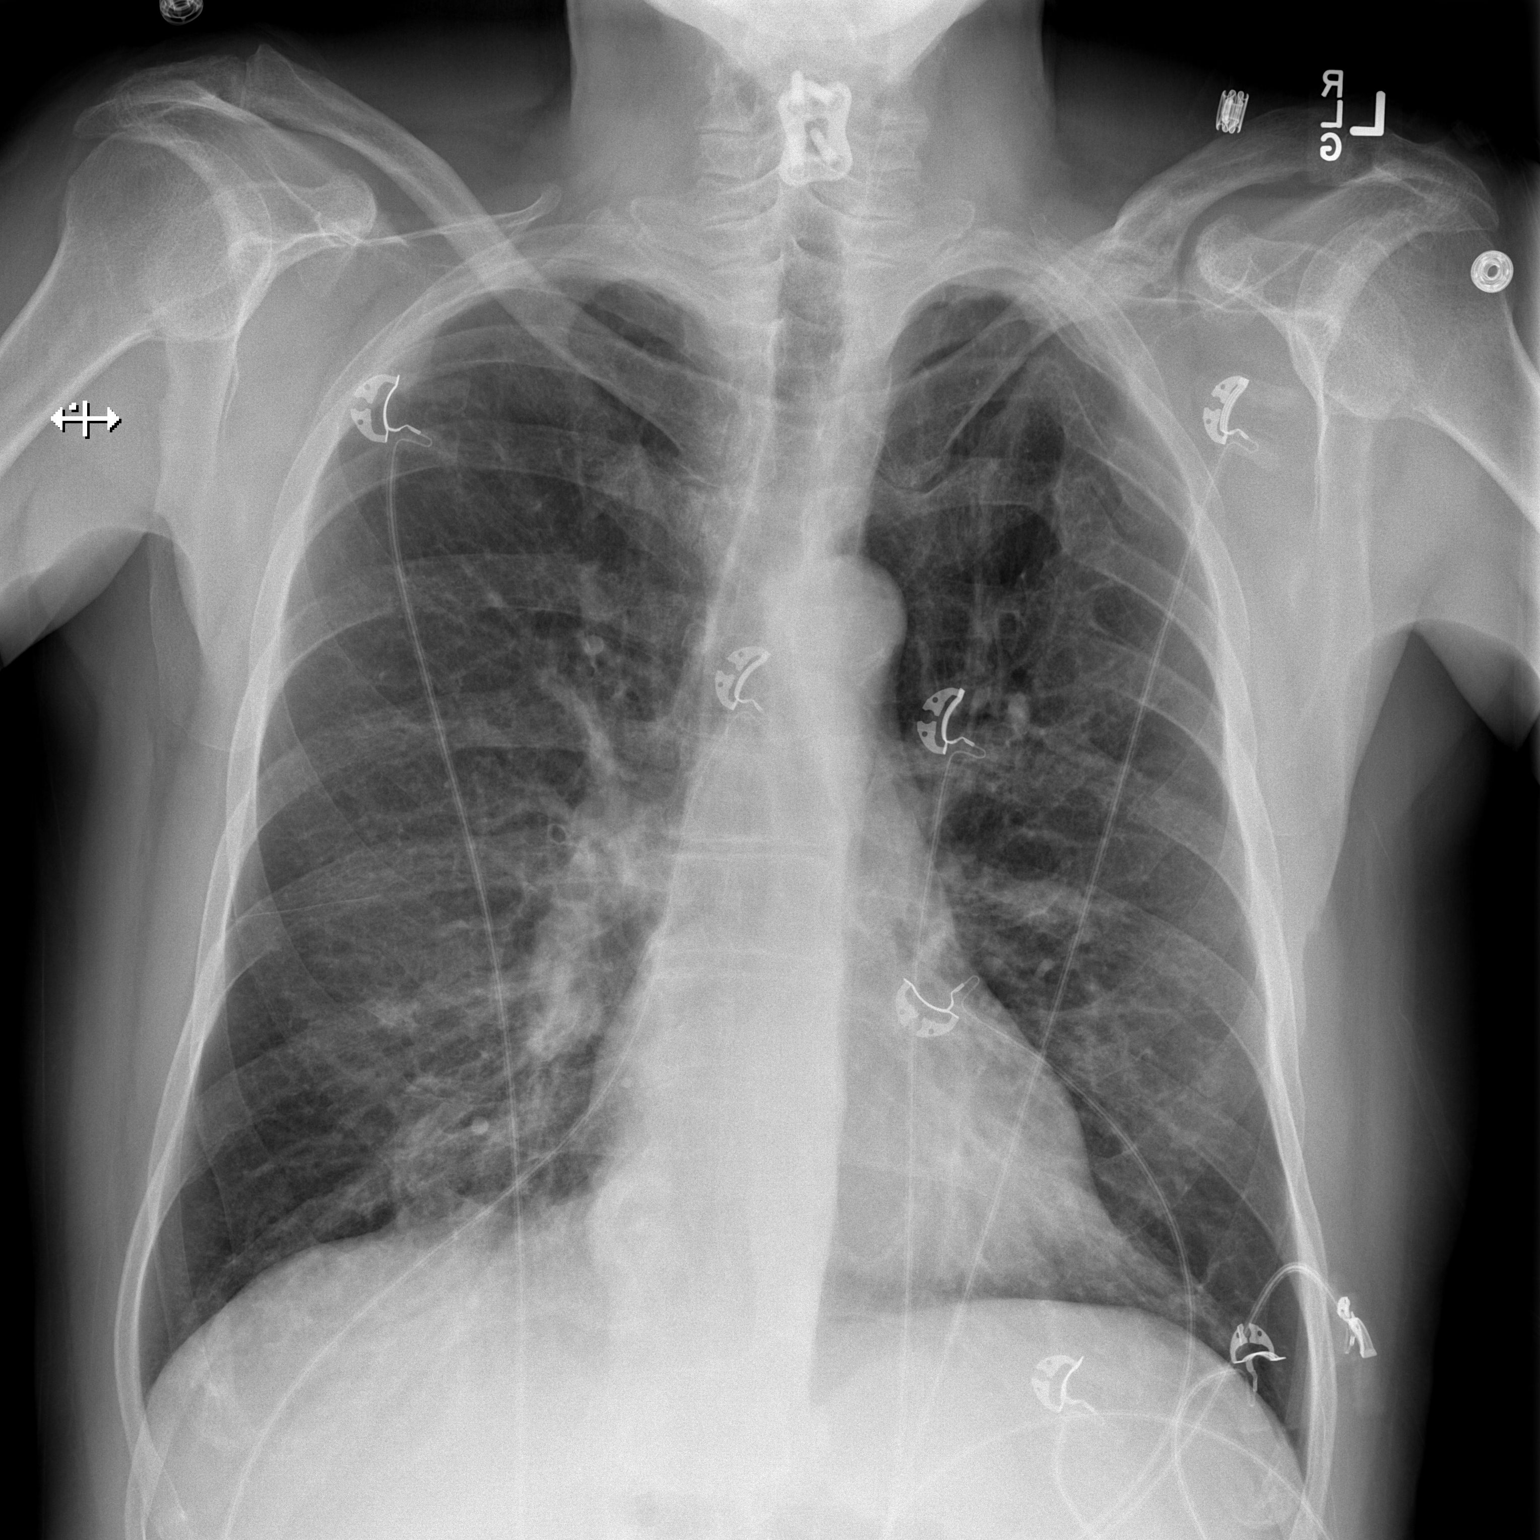

[w chest lat]
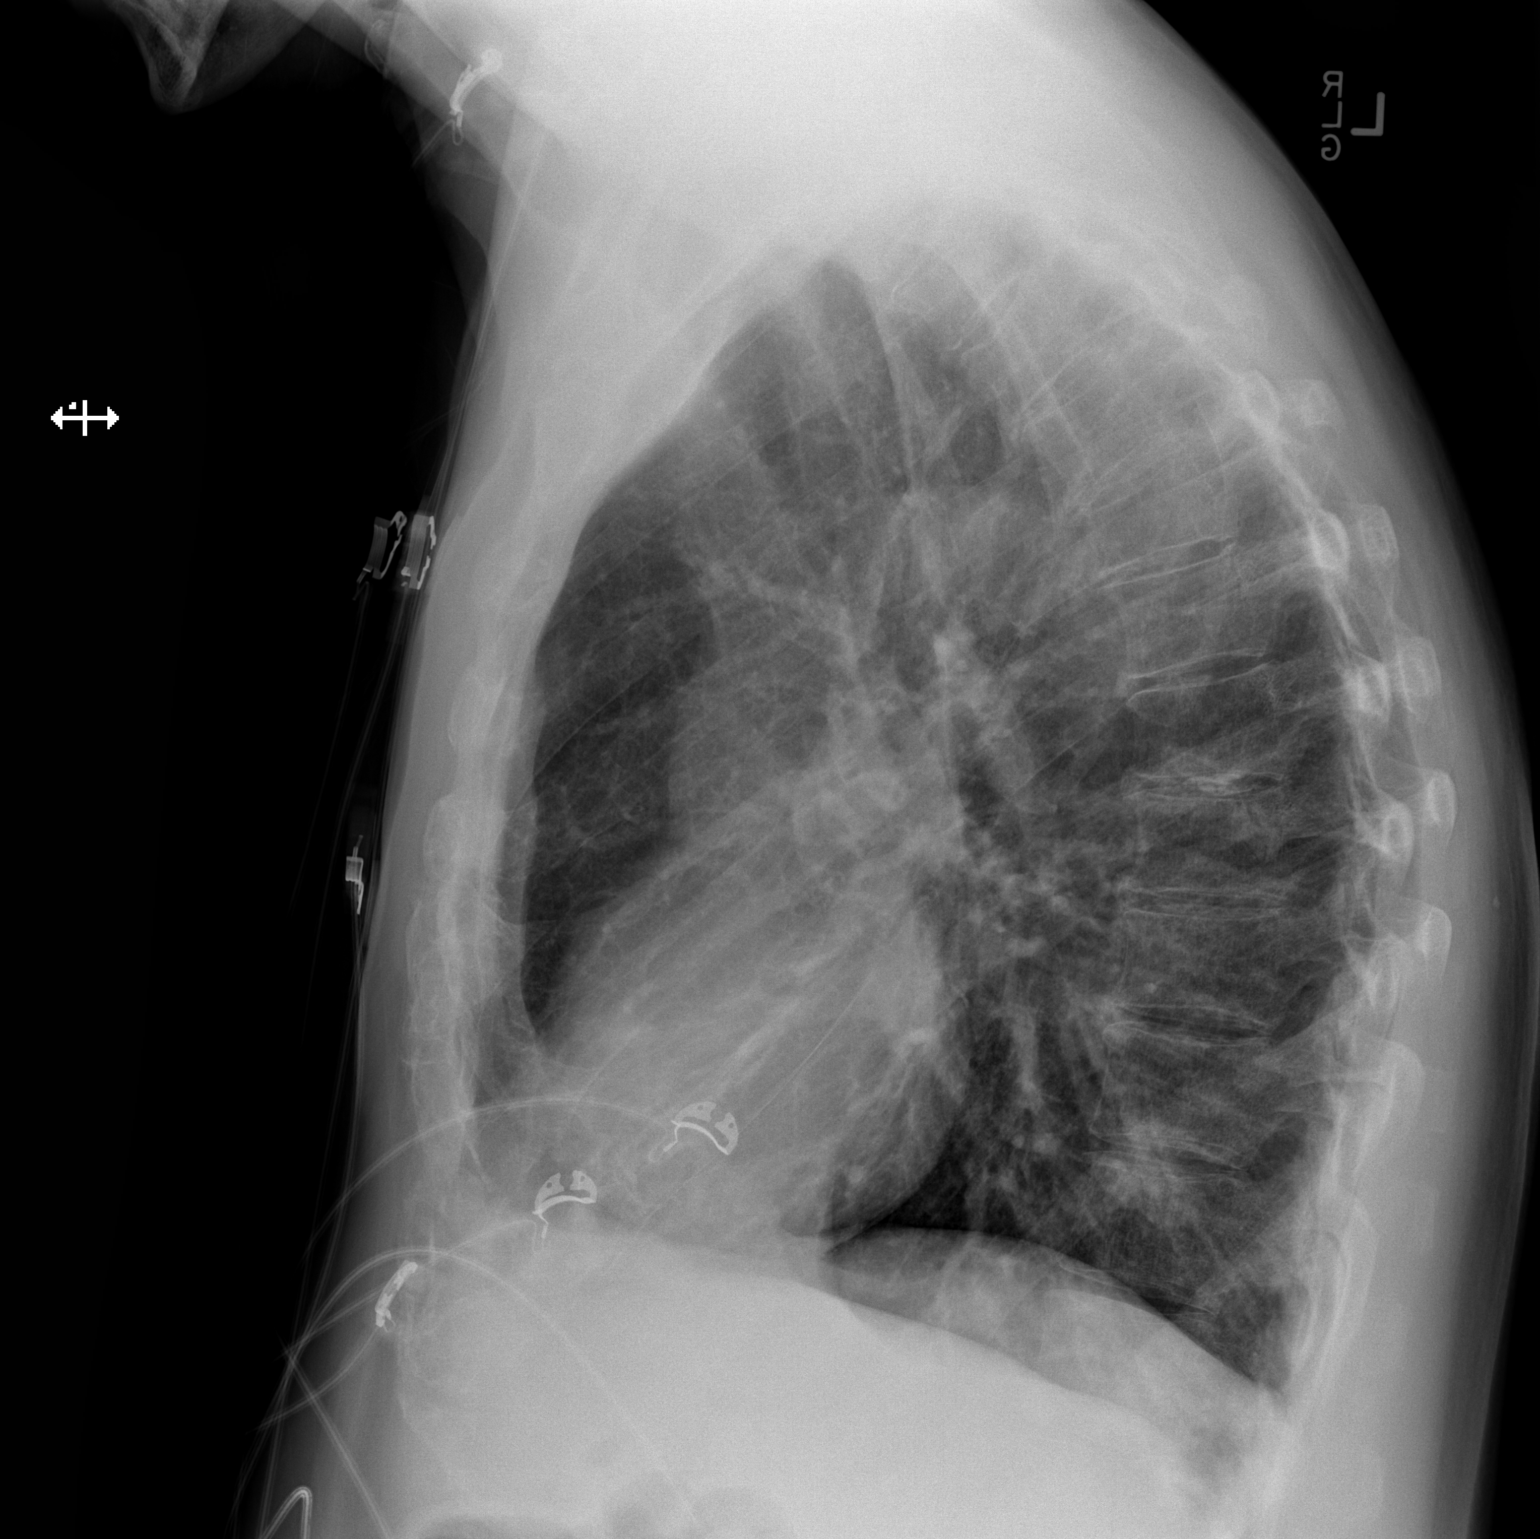

[2 of 2 positions shown; findings below may reference images not displayed]

FINDINGS: There is hyperinflation of the lungs compatible with COPD. Heart and
mediastinal contours are within normal limits. No focal opacities or
effusions. No acute bony abnormality. Old healed multiple left rib
fractures.
IMPRESSION: COPD.  No active disease.

## 2018-10-13 IMAGING — DX DG CHEST 2V
3 series · 3 of 3 positions shown · non-contrast
Comparison: 06/16/2016 and prior radiographs

CLINICAL DATA: Chest pain and shortness of breath.

EXAM:
CHEST  2 VIEW

[chest lat (1 of 2)]
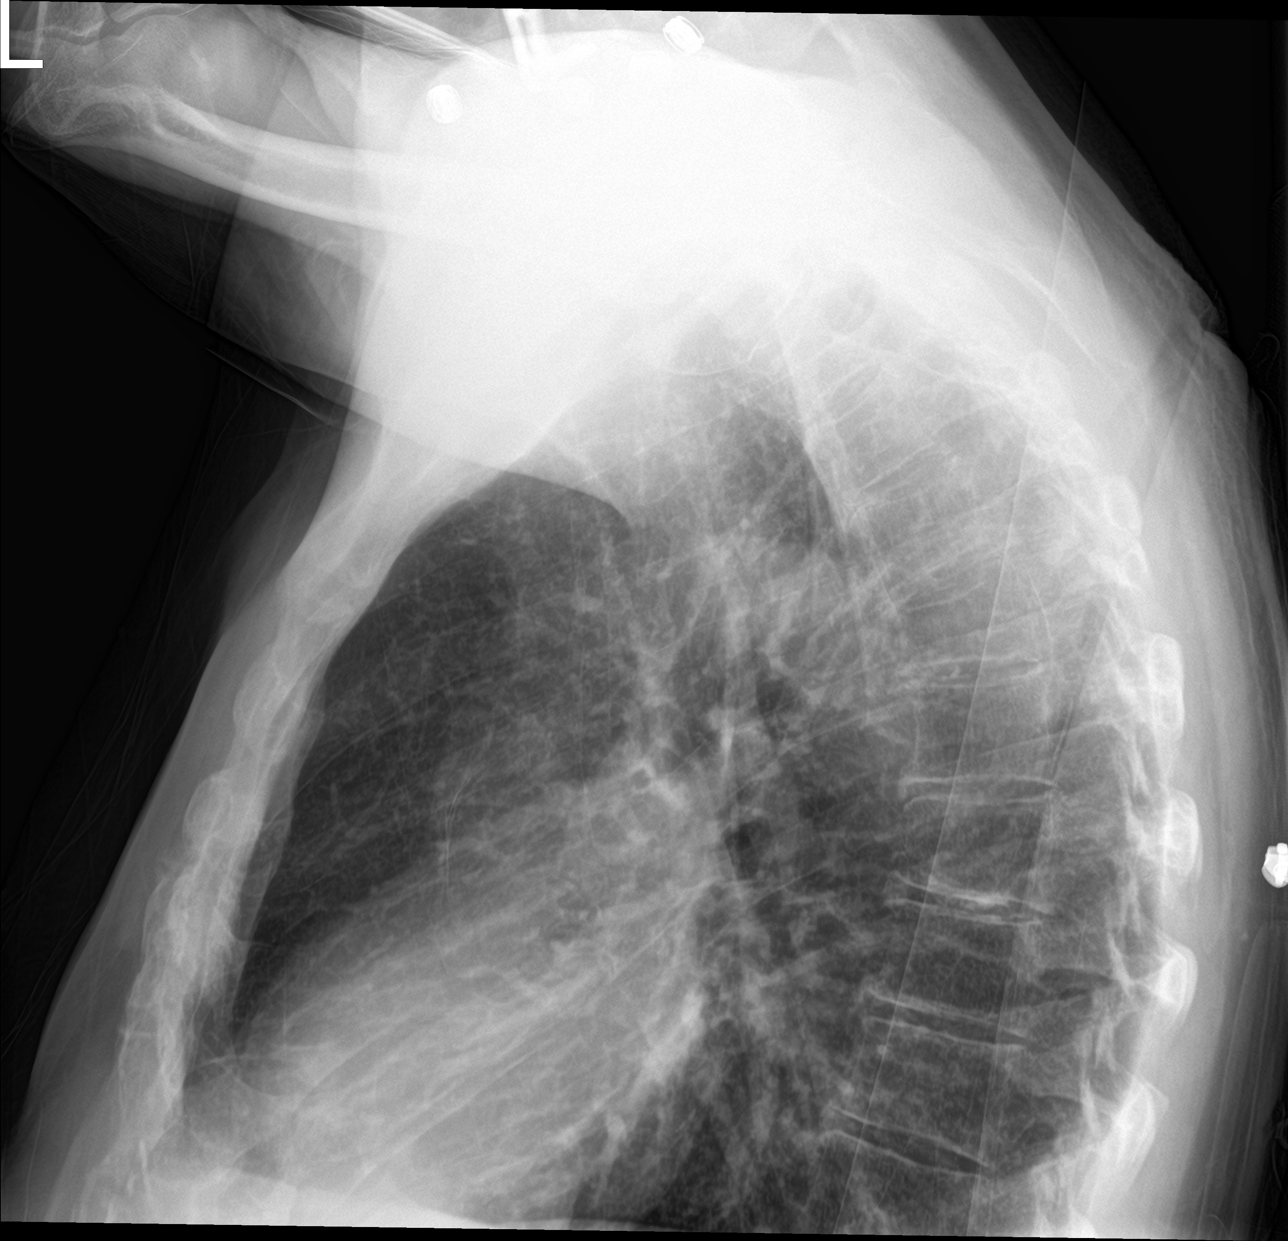

[chest ap]
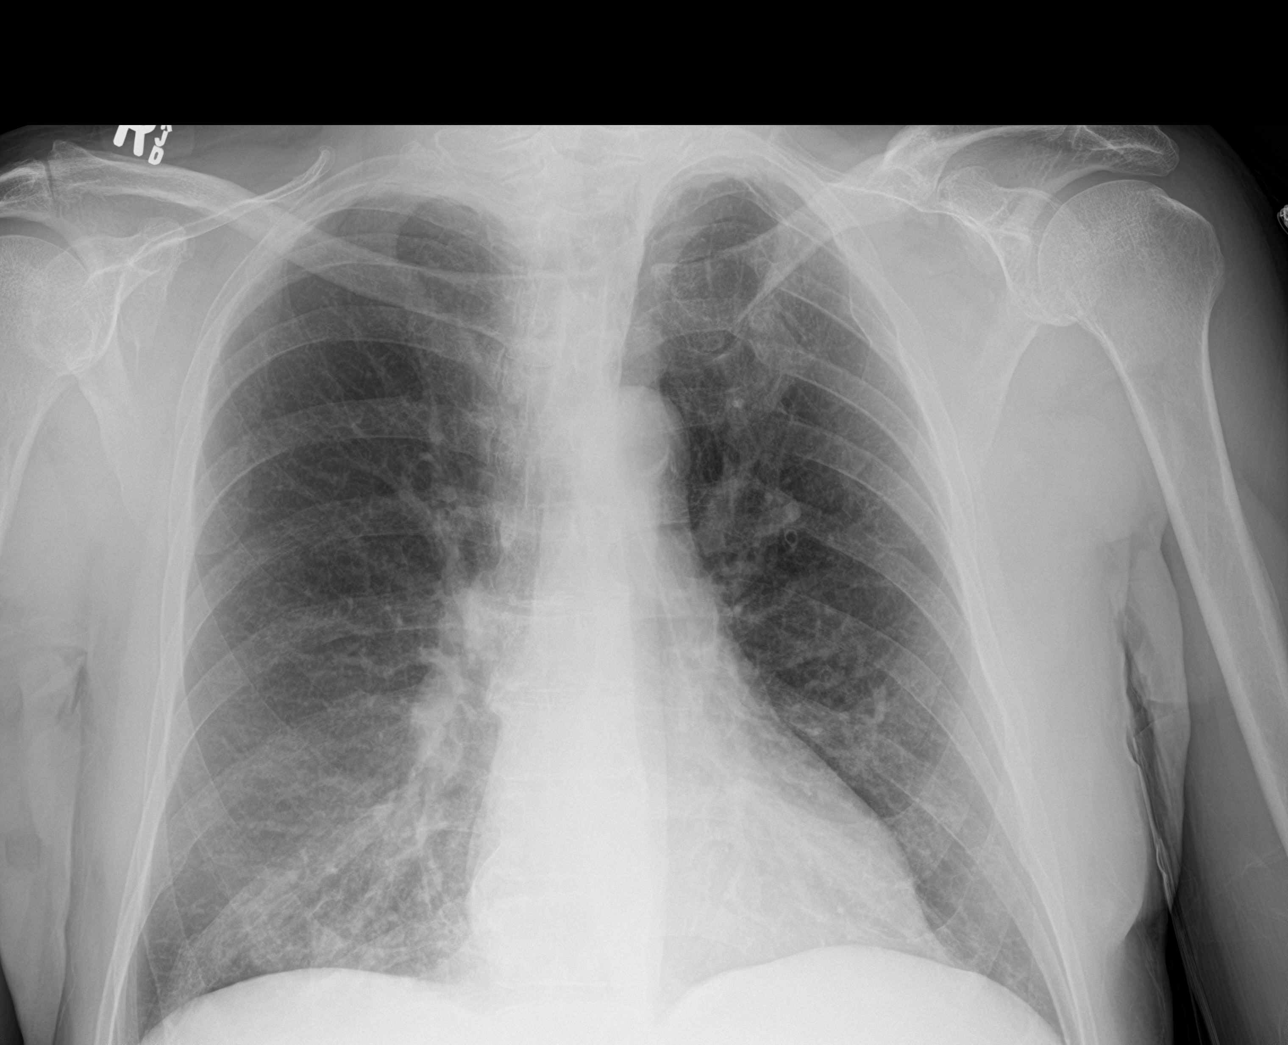

[chest lat (2 of 2)]
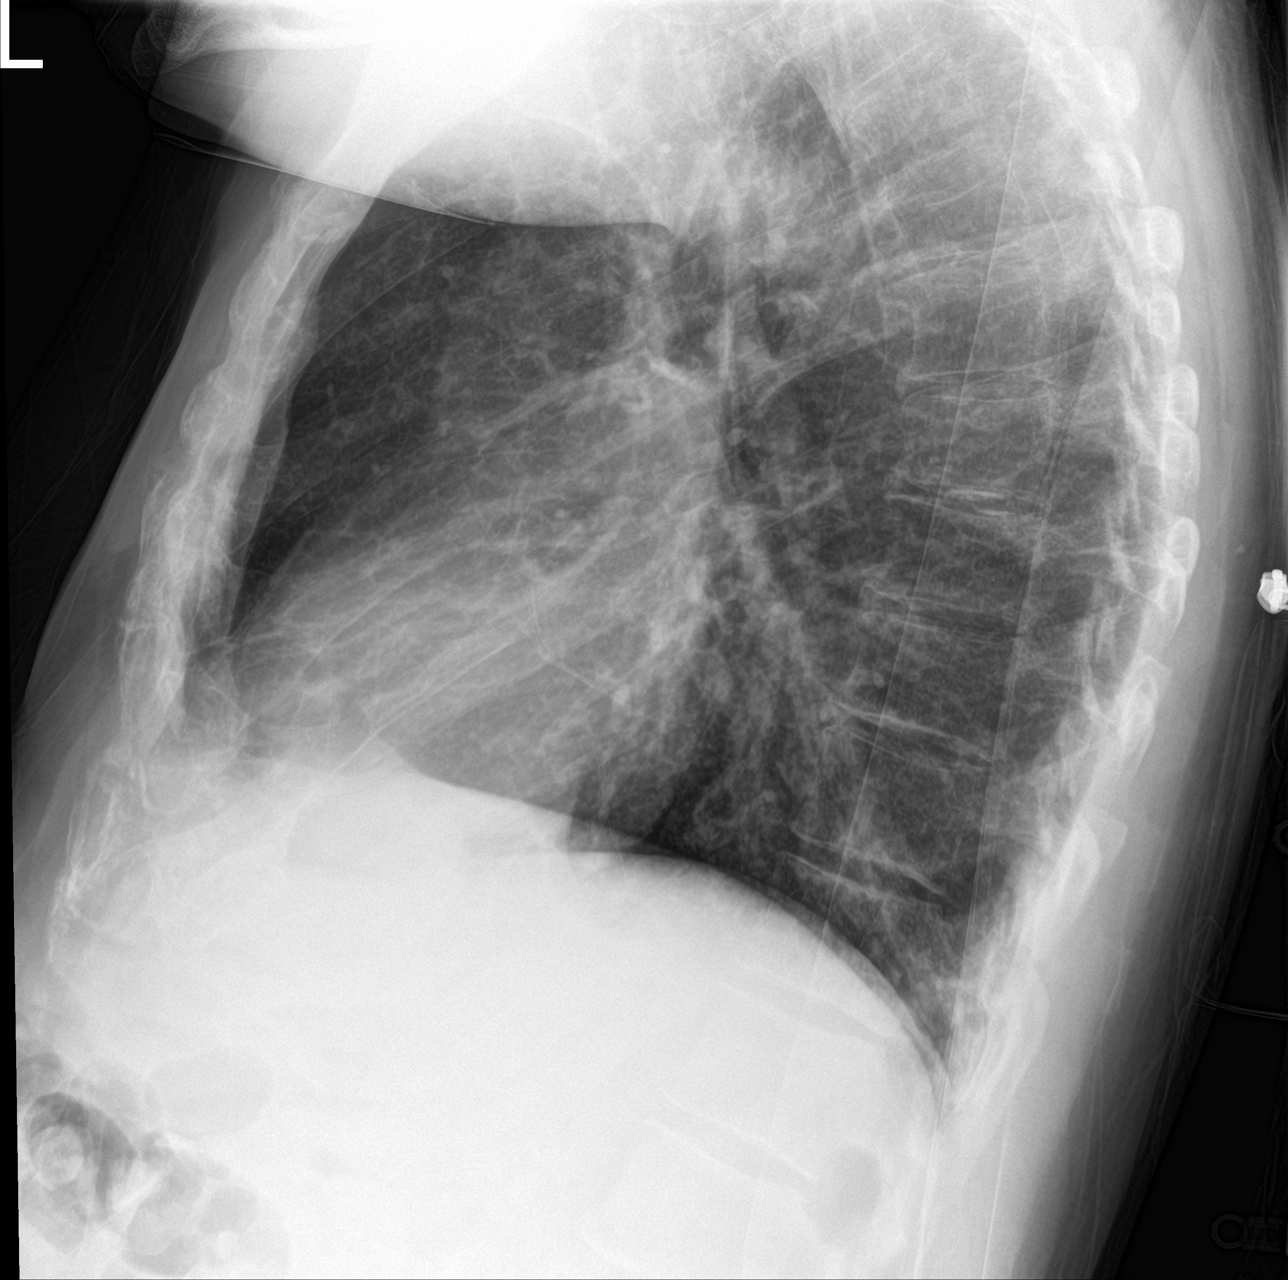

[3 of 3 positions shown; findings below may reference images not displayed]

FINDINGS: The cardiomediastinal silhouette is unremarkable.

COPD/emphysema changes again noted.

There is no evidence of focal airspace disease, pulmonary edema,
suspicious pulmonary nodule/mass, pleural effusion, or pneumothorax.
No acute bony abnormalities are identified.

Remote bilateral rib and left clavicle fractures and cervical fusion
changes again noted.
IMPRESSION: COPD/emphysema without evidence of acute cardiopulmonary disease.

## 2018-10-15 IMAGING — CR DG CHEST 2V
2 series · 2 of 2 positions shown · non-contrast
Comparison: CT chest 01/02/2014. PA and lateral chest 06/19/2016
and 06/16/2016.

CLINICAL DATA: Elevated heart rate today.  History of COPD.

EXAM:
CHEST  2 VIEW

[chest pa]
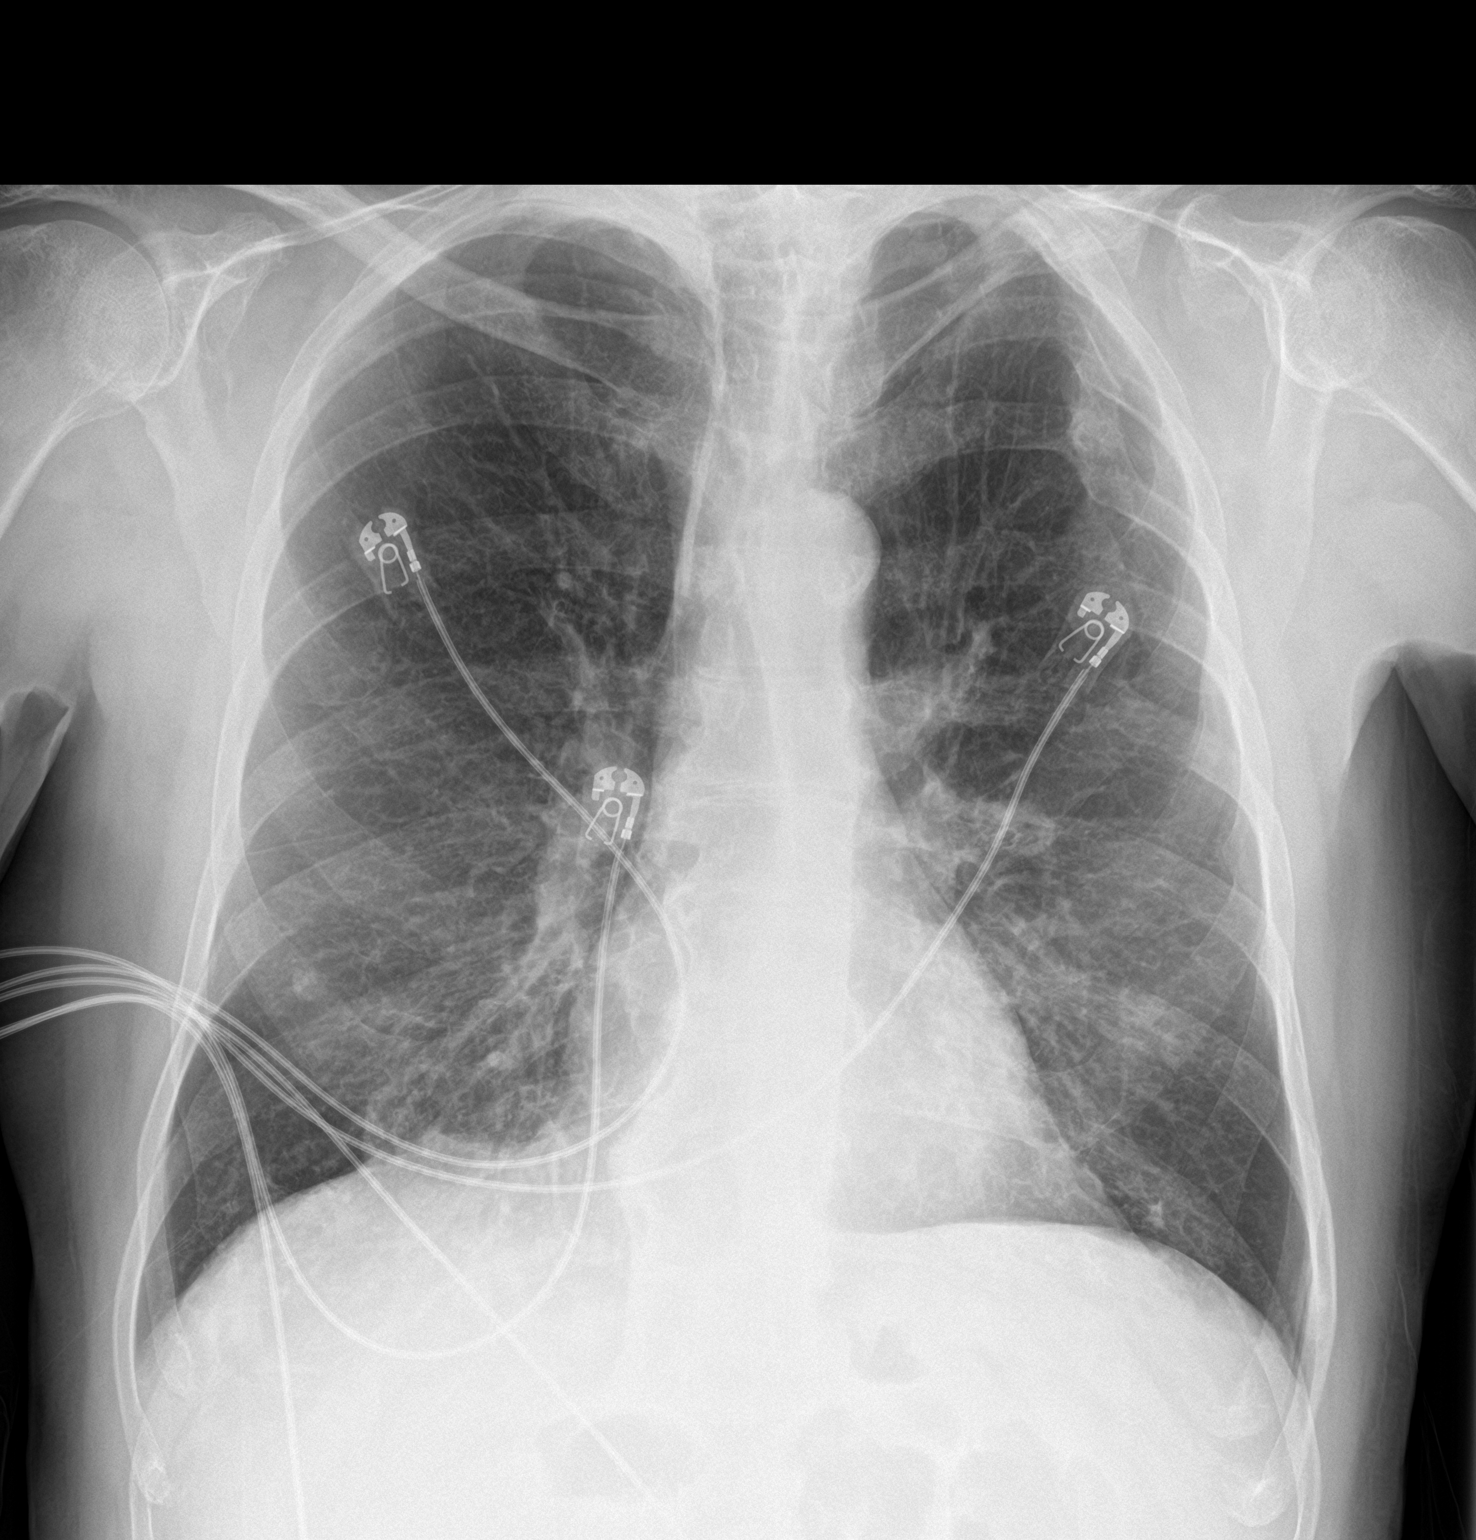

[chest lat]
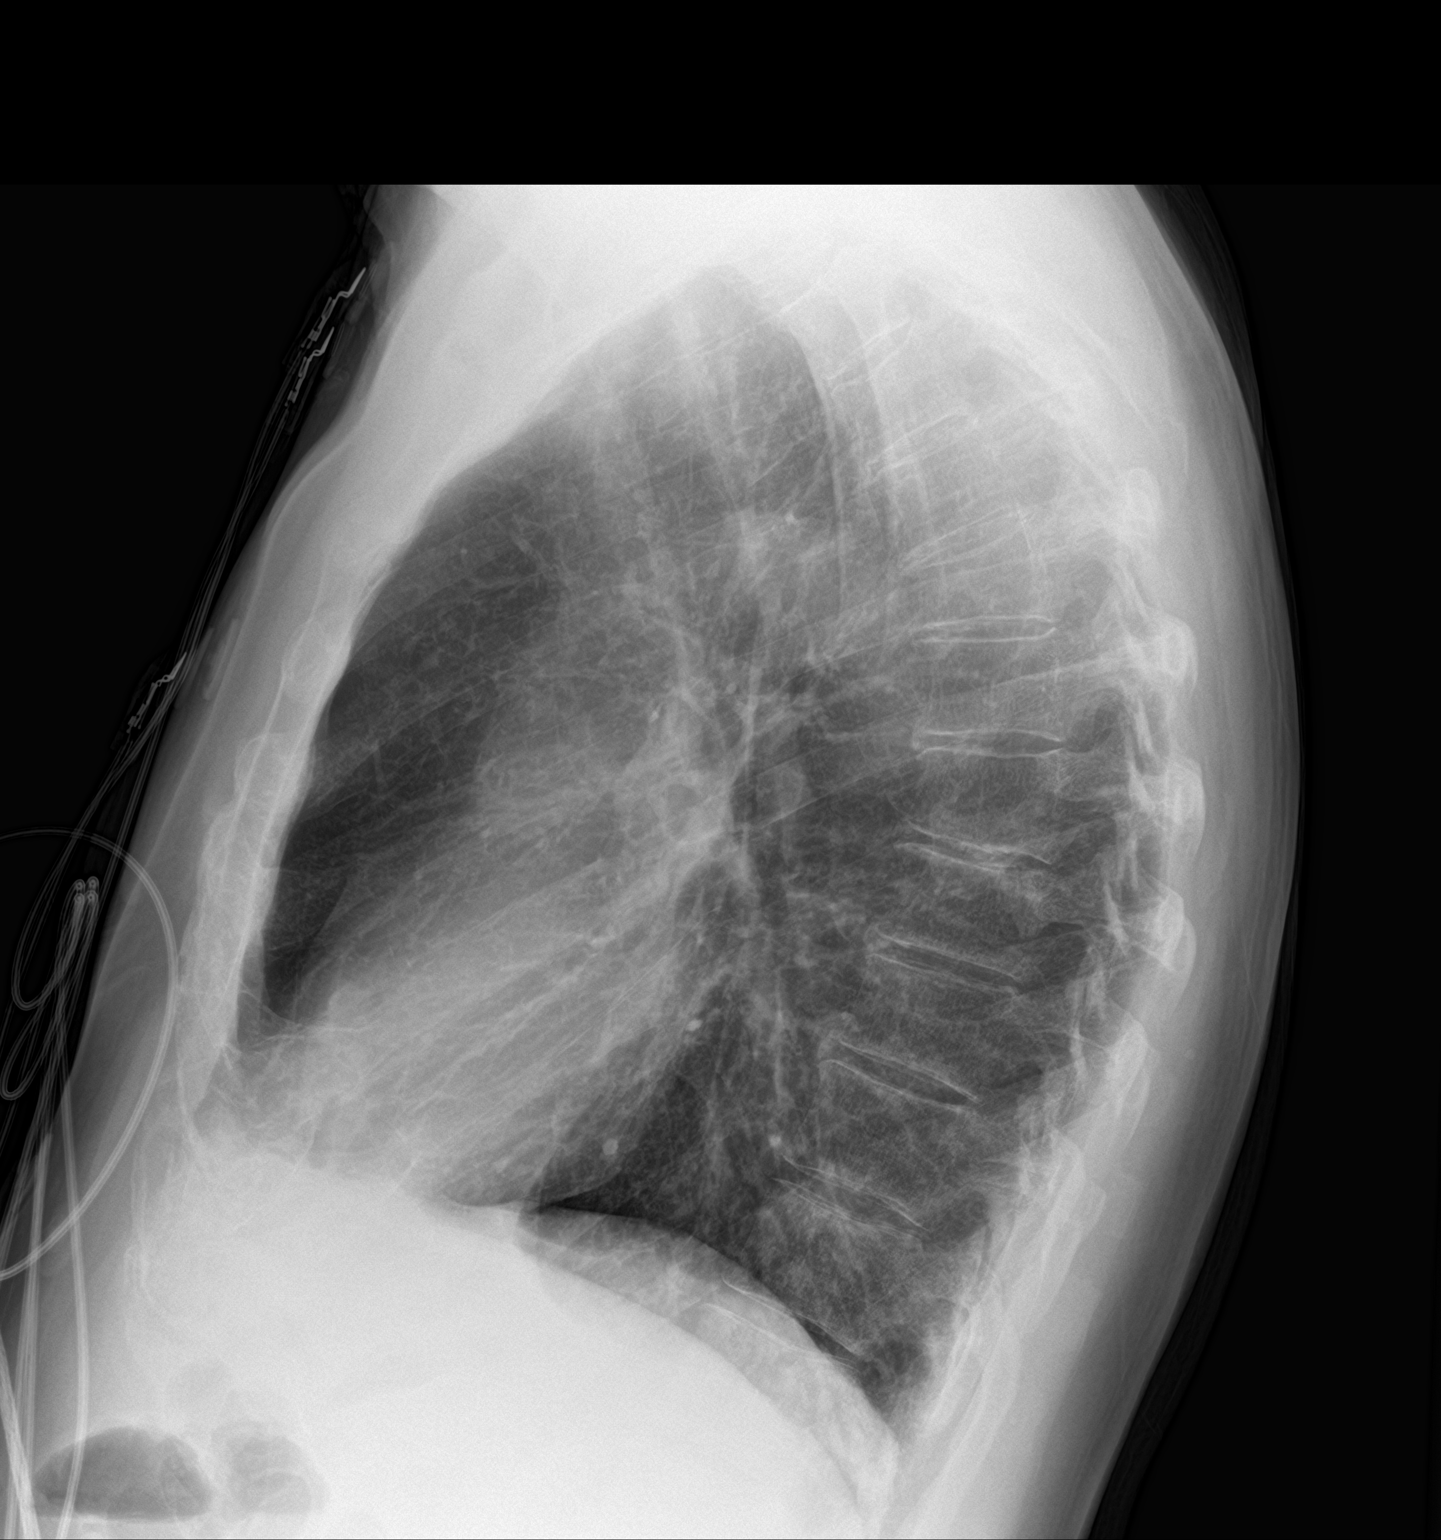

[2 of 2 positions shown; findings below may reference images not displayed]

FINDINGS: The lungs are clear. The chest is hyperexpanded. No pneumothorax or
pleural effusion. Heart size is normal. Aortic atherosclerosis is
noted. Remote upper left rib fractures are seen.
IMPRESSION: No acute disease.

Pulmonary hyperexpansion compatible with emphysema.

Atherosclerosis.

## 2019-07-11 IMAGING — CT Vascular^CTA_RUNOFF (Adult)
1 of 6 series · 5 of 16 positions shown, 7 images · IV contrast (OMNI 350)
Comparison: Prior angiographic images 03/26/2014

CLINICAL DATA: 56-year-old male with a history of IV drug abuse and
possible arterial bypass graft infection.

EXAM:
CT ANGIOGRAPHY OF ABDOMINAL AORTA WITH ILIOFEMORAL RUNOFF
TECHNIQUE: Multidetector CT imaging of the abdomen, pelvis and lower
extremities was performed using the standard protocol during bolus
administration of intravenous contrast. Multiplanar CT image
reconstructions and MIPs were obtained to evaluate the vascular
anatomy.
CONTRAST:  100 mL Isovue 370

[Series 6: cta runoff (id) · axial · 0.72mm/px · z∈[+246,+1155]mm · 5 of 455 slices shown, 7 images]
[im 76/455  soft-tissue]
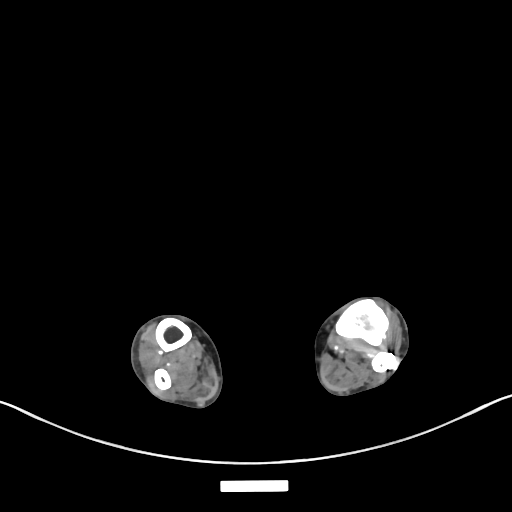
[im 76/455  bone]
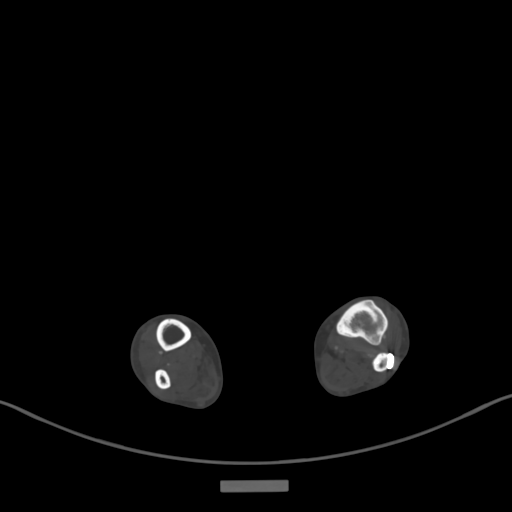
[im 152/455  soft-tissue]
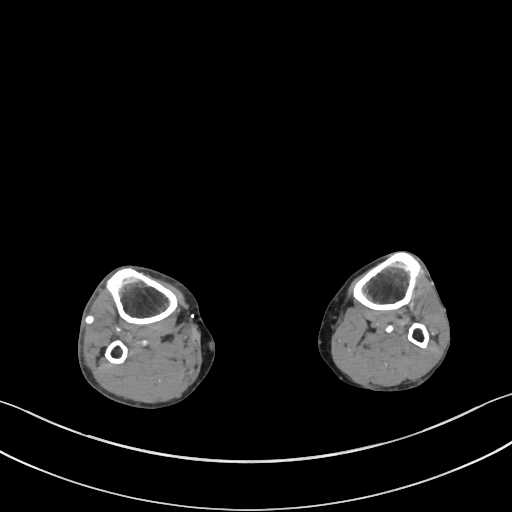
[im 228/455  soft-tissue]
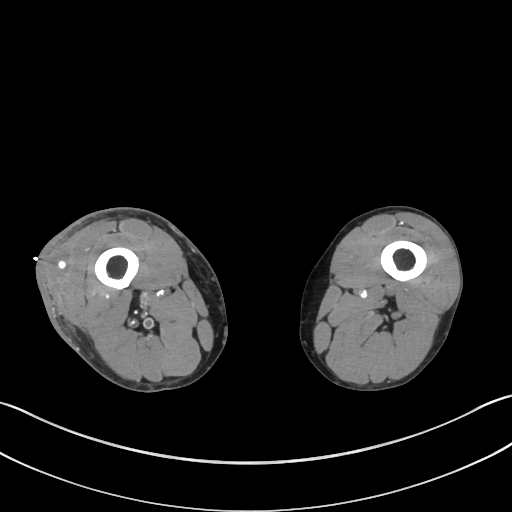
[im 303/455  soft-tissue]
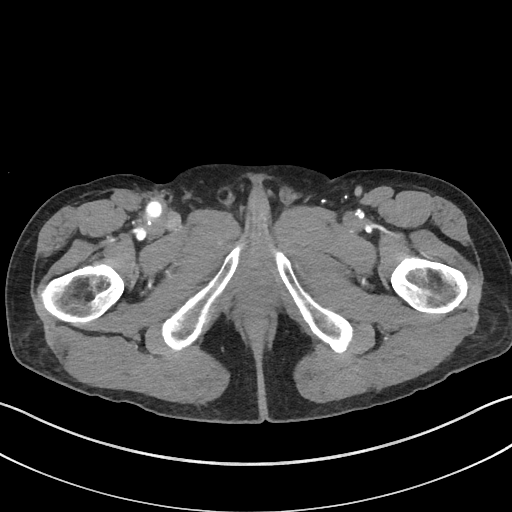
[im 379/455  soft-tissue]
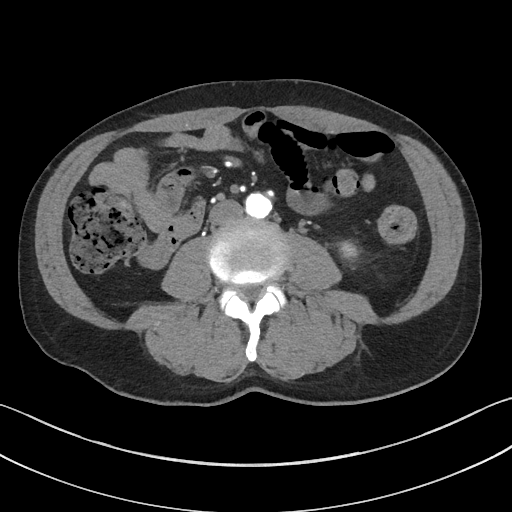
[im 379/455  bone]
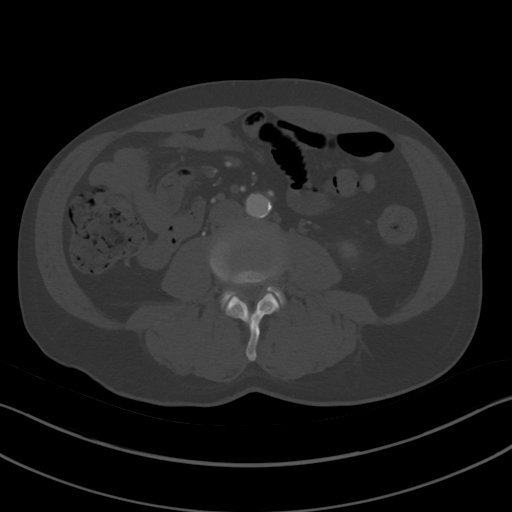

[5 of 16 positions shown; findings below may reference images not displayed]

FINDINGS: VASCULAR

Aorta: Minimally ectatic but not aneurysmal abdominal aorta with
scattered fibrofatty and calcified atherosclerotic plaque. No
evidence of dissection.

Celiac: Patent without evidence of aneurysm, dissection, vasculitis
or significant stenosis.

SMA: Patent without evidence of aneurysm, dissection, vasculitis or
significant stenosis.

Renals: Both renal arteries are patent without evidence of aneurysm,
dissection, vasculitis, fibromuscular dysplasia or significant
stenosis.

IMA: Patent without evidence of aneurysm, dissection, vasculitis or
significant stenosis.

RIGHT Lower Extremity

Inflow: Heterogeneous atherosclerotic plaque throughout the common
iliac artery without focal significant stenosis. Moderate stenosis
present at the origin of the internal iliac artery. Moderate
stenosis present in the proximal external iliac artery. The external
iliac artery is diffusely diseased.

Outflow: Surgical changes of distal common femoral endarterectomy,
patch angioplasty and superficial femoral to anterior tibial bypass
graft. The proximal superficial femoral artery is dilated and
measures 1.8 x 1.5 cm. There is an abandoned an occluded superficial
femoral to below the knee popliteal artery bypass graft.
Unfortunately, the graft in the distal thigh is surrounded by a
peripherally enhancing fluid collection with a relatively thick rim
measuring approximately 2.5 x 1.6 by 6.1 cm. Inflammatory changes
are present in the surrounding fat and there is marked thickening of
the overlying skin. The P1 and P2 segments of the popliteal artery
are chronically occluded. The distal most popliteal artery
reconstitutes at the origin of the anterior tibial artery.

Runoff: The posterior tibial artery occludes in the proximal calf.
The femoral to anterior tibial bypass anastomosis is widely patent.
Two vessel runoff to the ankle in the form of the anterior tibial
and peroneal arteries.

LEFT Lower Extremity

Inflow: Widely patent left common iliac artery stent. The internal
iliac and external iliac arteries remain patent. The external iliac
artery demonstrates extensive calcified atherosclerotic plaque but
no significant focal stenosis.

Outflow: Calcified plaque along the posterior wall of the common
femoral artery resulting in up to 50% stenosis. The profunda femoral
arterial branches remain patent. The superficial femoral artery is
extensively diseased with areas of focal moderate and high-grade
stenosis. The vessel remains patent. Popliteal artery is also
diseased with areas of moderate stenosis in the P1 and P2 segments.

Runoff: The anterior tibial artery is patent. The tibioperoneal
trunk is diseased but remains patent. Patent 3 vessel runoff to the
ankle.

Veins: No obvious venous abnormality within the limitations of this
arterial phase study.

Review of the MIP images confirms the above findings.

NON-VASCULAR

Lower chest: The lung bases are clear save for some mild areas of
linear pleuroparenchymal scarring in the posterolateral right lower
lobe and inferior aspect of the right middle lobe. Mild diffuse
bronchial wall thickening. Visualized cardiac structures are within
normal limits for size. No pericardial effusion. Unremarkable
visualized distal thoracic esophagus.

Hepatobiliary: Normal hepatic contour and morphology. No discrete
hepatic lesions. Normal appearance of the gallbladder. No intra or
extrahepatic biliary ductal dilatation.

Pancreas: Unremarkable. No pancreatic ductal dilatation or
surrounding inflammatory changes.

Spleen: Normal in size without focal abnormality.

Adrenals/Urinary Tract: Adreniform thickening bilaterally consistent
with adrenal hyperplasia. No focal nodularity. Normal appearance of
the kidneys. No hydronephrosis, definite nephrolithiasis or
enhancing renal mass.

Stomach/Bowel: No evidence of obstruction or focal bowel wall
thickening. Normal appendix in the right lower quadrant. The
terminal ileum is unremarkable.

Lymphatic: Prominent right superficial and deep inguinal lymph nodes
are likely reactive and secondary to the abscess in the lateral
aspect of the distal right thigh. Otherwise, no suspicious
lymphadenopathy.

Reproductive: Prostate is unremarkable.

Other: No abdominal wall hernia or abnormality. No abdominopelvic
ascites.

Musculoskeletal: No acute fracture or aggressive appearing lytic or
blastic osseous lesion.
IMPRESSION: VASCULAR

1. CT findings are concerning for abscess surrounding the femoral to
anterior tibial artery bypass graft in the distal aspect of the
thigh. The abscess collection measures approximately 2.5 x 1.6 x
cm and encircles the graft. The graft remains patent and there is no
evidence of pseudoaneurysm formation at this time.
2. Ectasia/aneurysmal dilatation of the femoral artery at the
transition from the common to the superficial femoral artery likely
due in part to prior surgical intervention, patch angioplasty and
bypass. The artery measures up to 1.8 cm in diameter.
3. Abandoned femoral to below the knee popliteal bypass graft. No
evidence of infection of this occluded graft.
4. Advanced multifocal left superficial femoral and popliteal
arterial disease. Patent 3 vessel runoff to the ankle on the left.
5. Chronic occlusion of the right posterior tibial artery.
6.  Aortic Atherosclerosis ([id number]-170.0)

NON-VASCULAR

1. Right superficial and deep inguinal lymphadenopathy is almost
certainly reactive and related to the abscess and overlying
cellulitis in the lateral aspect of the distal thigh.
2. No acute abnormality within the abdomen or pelvis.

## 2019-08-01 IMAGING — CT Thorax^CTA_PE (Adult)
1 of 6 series · 3 of 16 positions shown, 4 images · IV contrast (Omni 300)
Comparison: Chest x-ray 04/07/2017, CT 03/17/2017

ADDENDUM:
Old distal left clavicle fracture multiple old left-sided rib
fractures.
CLINICAL DATA: Productive cough with shortness of breath

EXAM:
CT ANGIOGRAPHY CHEST WITH CONTRAST
TECHNIQUE: Multidetector CT imaging of the chest was performed using the
standard protocol during bolus administration of intravenous
contrast. Multiplanar CT image reconstructions and MIPs were
obtained to evaluate the vascular anatomy.
CONTRAST:  100 mL Isovue 370 intravenous

[Series 7: pe thins · axial · 0.74mm/px · z∈[+586,+754]mm · 3 of 337 slices shown, 4 images]
[im 85/337  soft-tissue]
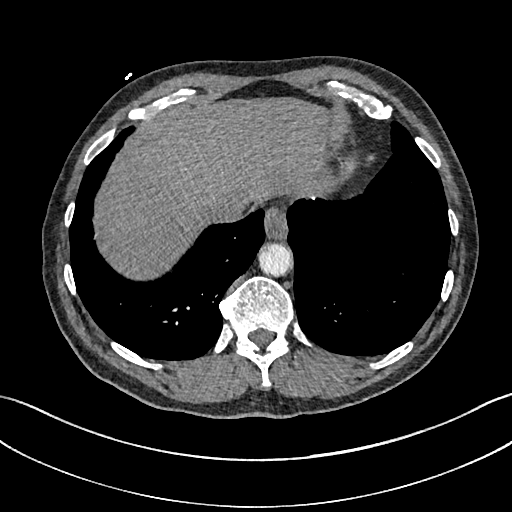
[im 85/337  lung]
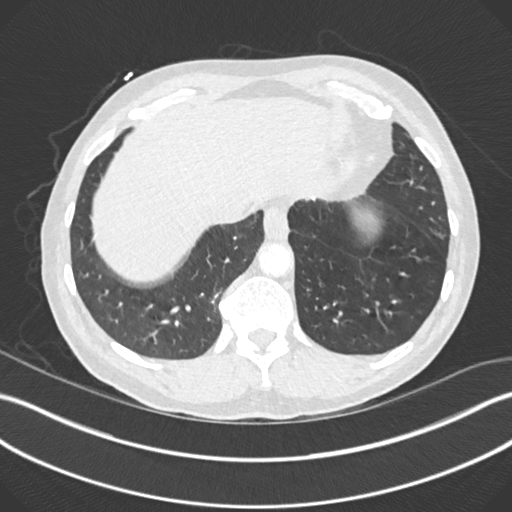
[im 169/337  lung]
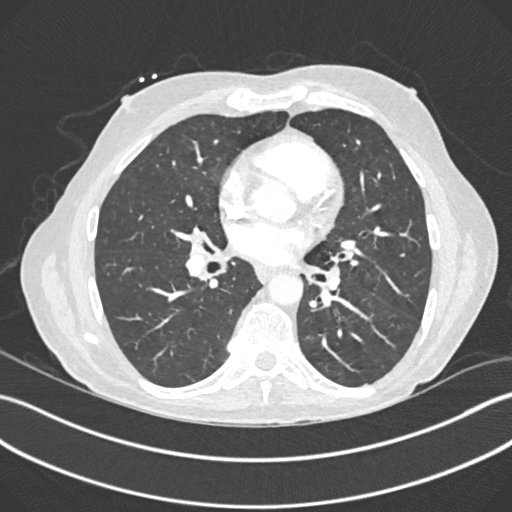
[im 253/337  lung]
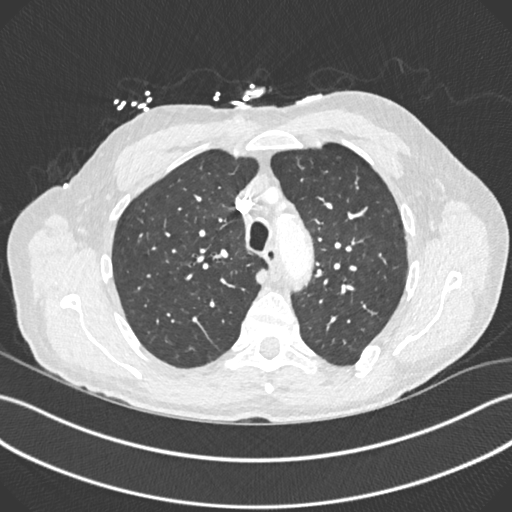

[3 of 16 positions shown; findings below may reference images not displayed]

FINDINGS: Cardiovascular: Satisfactory opacification of the pulmonary arteries
to the segmental level. No evidence of pulmonary embolism.
Nonaneurysmal aorta. No dissection seen. Moderate aortic
atherosclerosis. Coronary vascular calcification. Normal heart size.
No pericardial effusion.

Mediastinum/Nodes: Midline trachea. No thyroid mass. Subcentimeter
nonspecific mediastinal lymph nodes. Esophagus within normal limits.

Lungs/Pleura: Focal airspace consolidation posterior right lower
lobe and right middle lobe similar to recent CT with minimal
bronchiectasis in the right middle lobe, likely
postinfectious/scarring. Minimal tree-in-bud density in the left
upper lobe/lingula. No pleural effusion

Upper Abdomen: Nodular thickening of adrenal glands with possible 16
mm hypodense right adrenal nodule, likely adenoma.

Musculoskeletal: Age indeterminate mild compression at T5.

Review of the MIP images confirms the above findings.
IMPRESSION: 1. Negative for acute pulmonary embolus or aortic dissection
2. Probable areas of parenchymal scarring in the right middle lobe
and right base. Mild nodular density in the lingula/left upper lobe
suggesting bronchiolitis or mild respiratory infection.

Aortic Atherosclerosis ([id number].D).

## 2019-08-01 IMAGING — DX DG CHEST 2 VIEW
2 series · 2 of 2 positions shown · non-contrast
Comparison: 02/24/2017 chest radiograph.

CLINICAL DATA: Dyspnea

EXAM:
CHEST - 2 VIEW

[w chest pa]
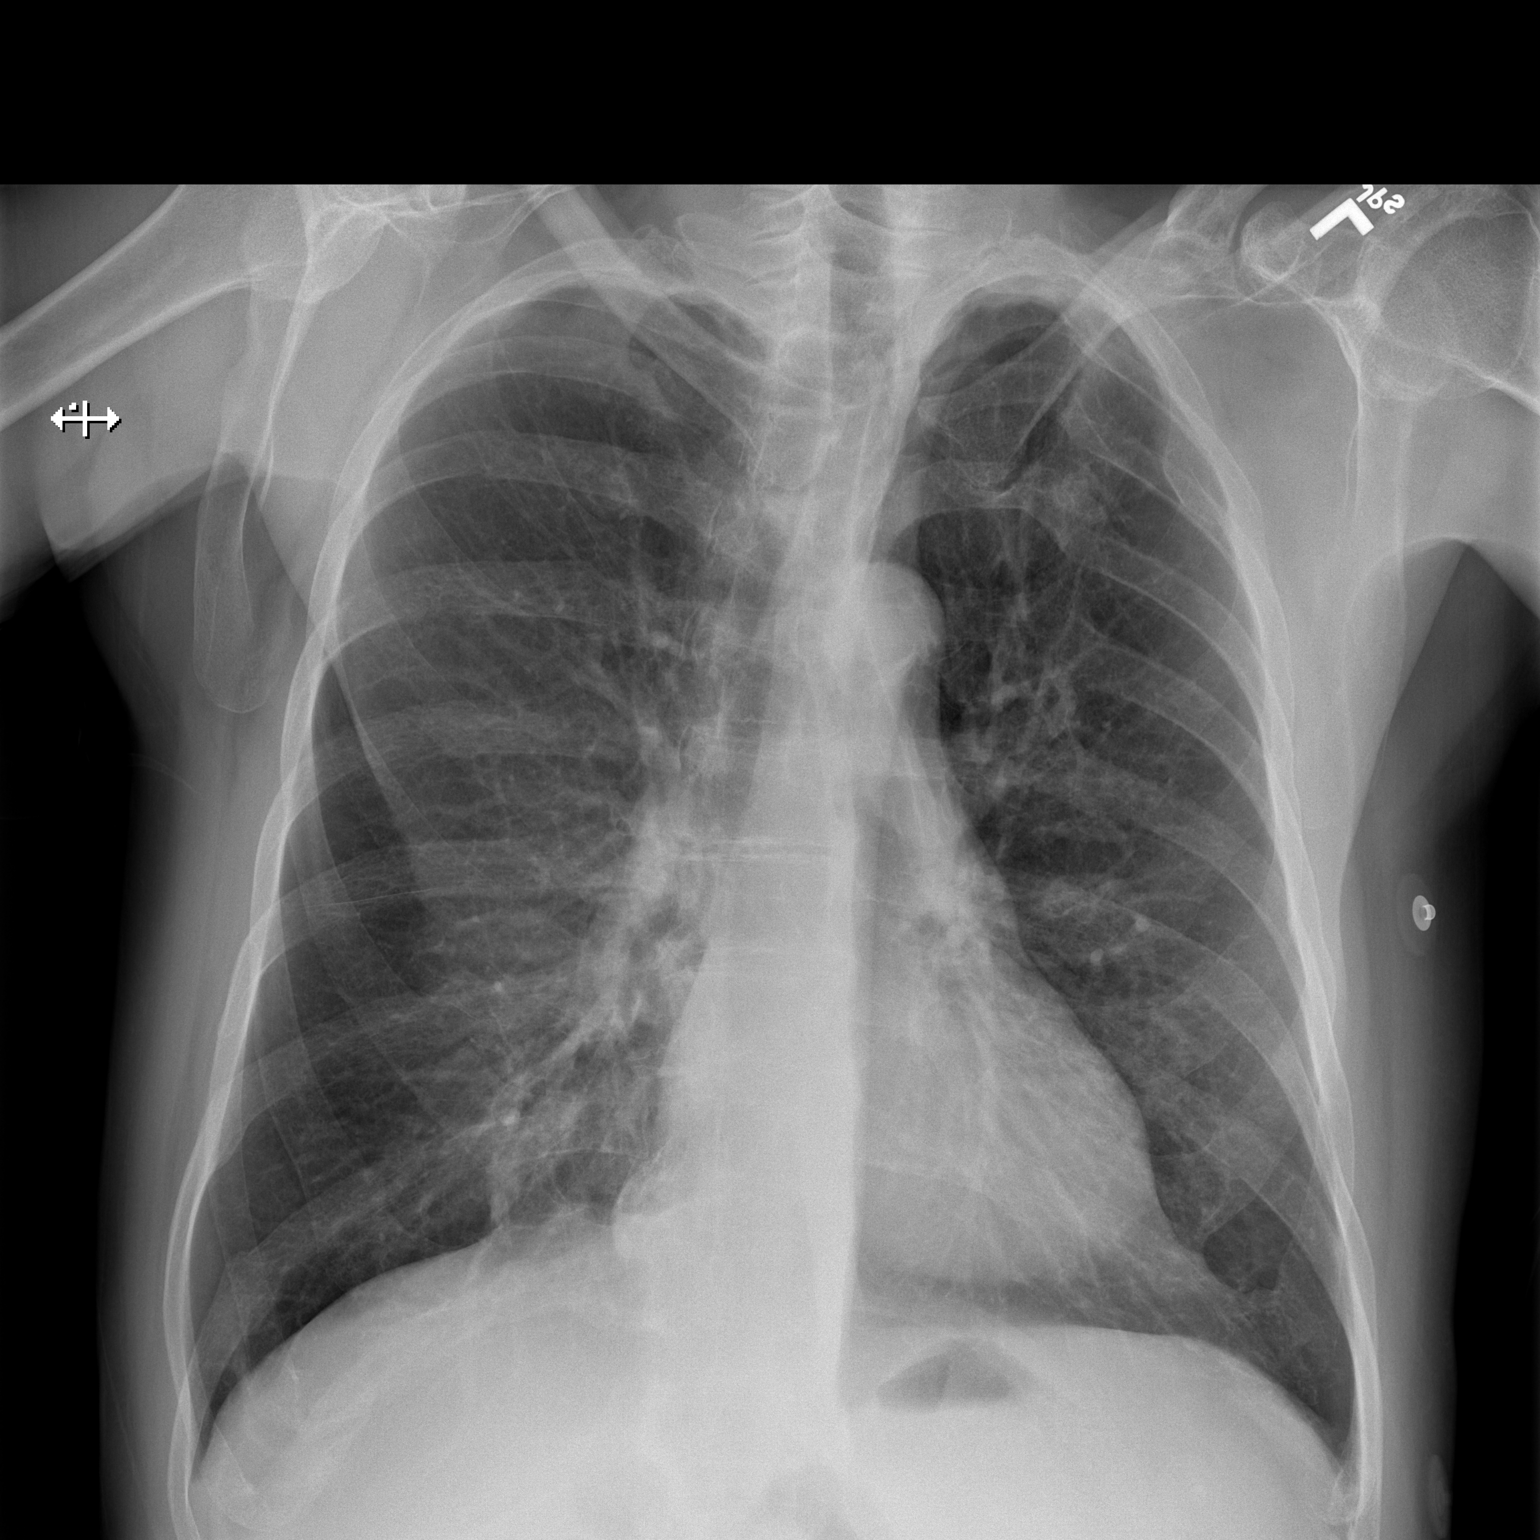

[w chest lat]
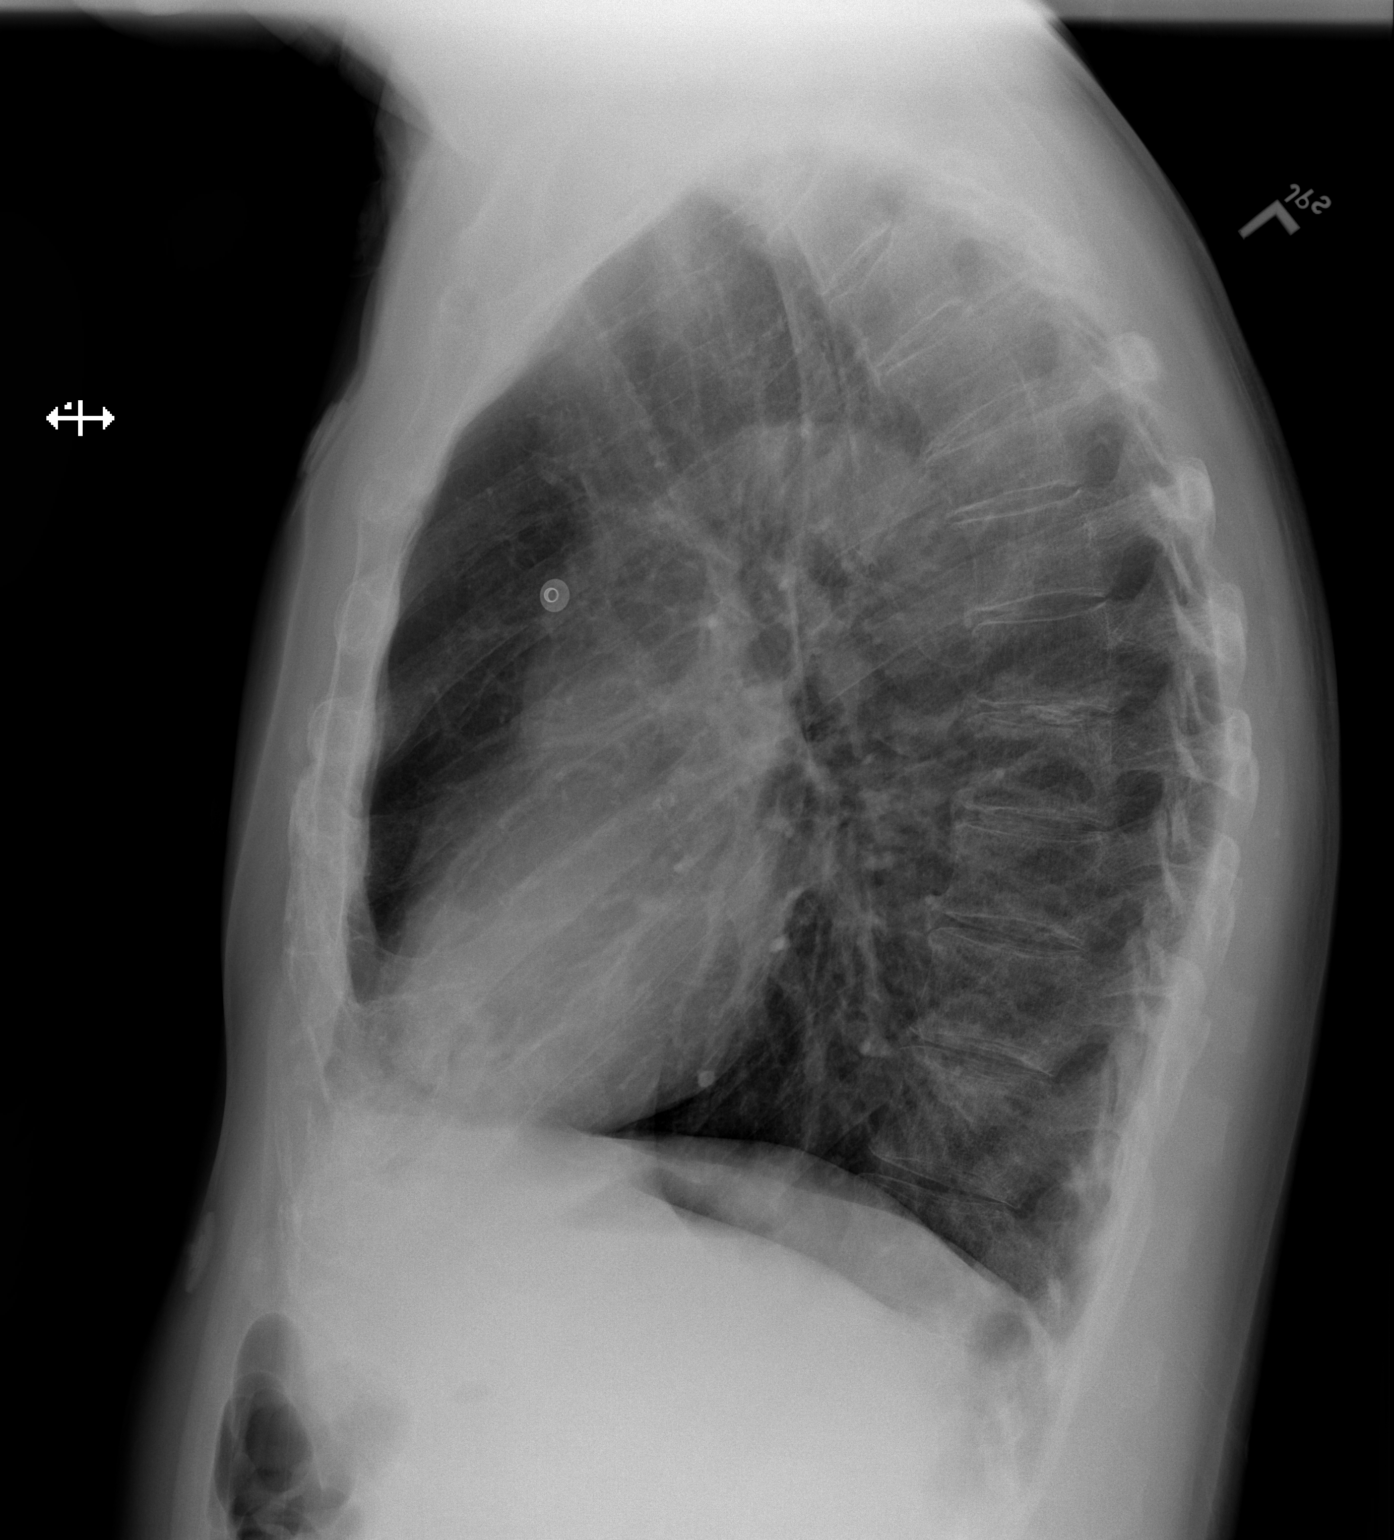

[2 of 2 positions shown; findings below may reference images not displayed]

FINDINGS: Surgical hardware from ACDF overlies the lower cervical spine.
Stable cardiomediastinal silhouette with normal heart size. No
pneumothorax. No pleural effusion. Hyperinflated lungs. No pulmonary
edema. No acute consolidative airspace disease. Stable healed
deformities in multiple upper left ribs.
IMPRESSION: 1. No acute cardiopulmonary disease.
2. Hyperinflated lungs, suggesting COPD.

## 2019-11-24 NOTE — Telephone Encounter (Signed)
Encounter opened in error

## 2020-01-21 IMAGING — DX DG CHEST 2 VIEW
2 series · 2 of 2 positions shown · non-contrast
Comparison: September 11, 2017

CLINICAL DATA: Weakness.  Hypertension.

EXAM:
CHEST - 2 VIEW

[chest ap]
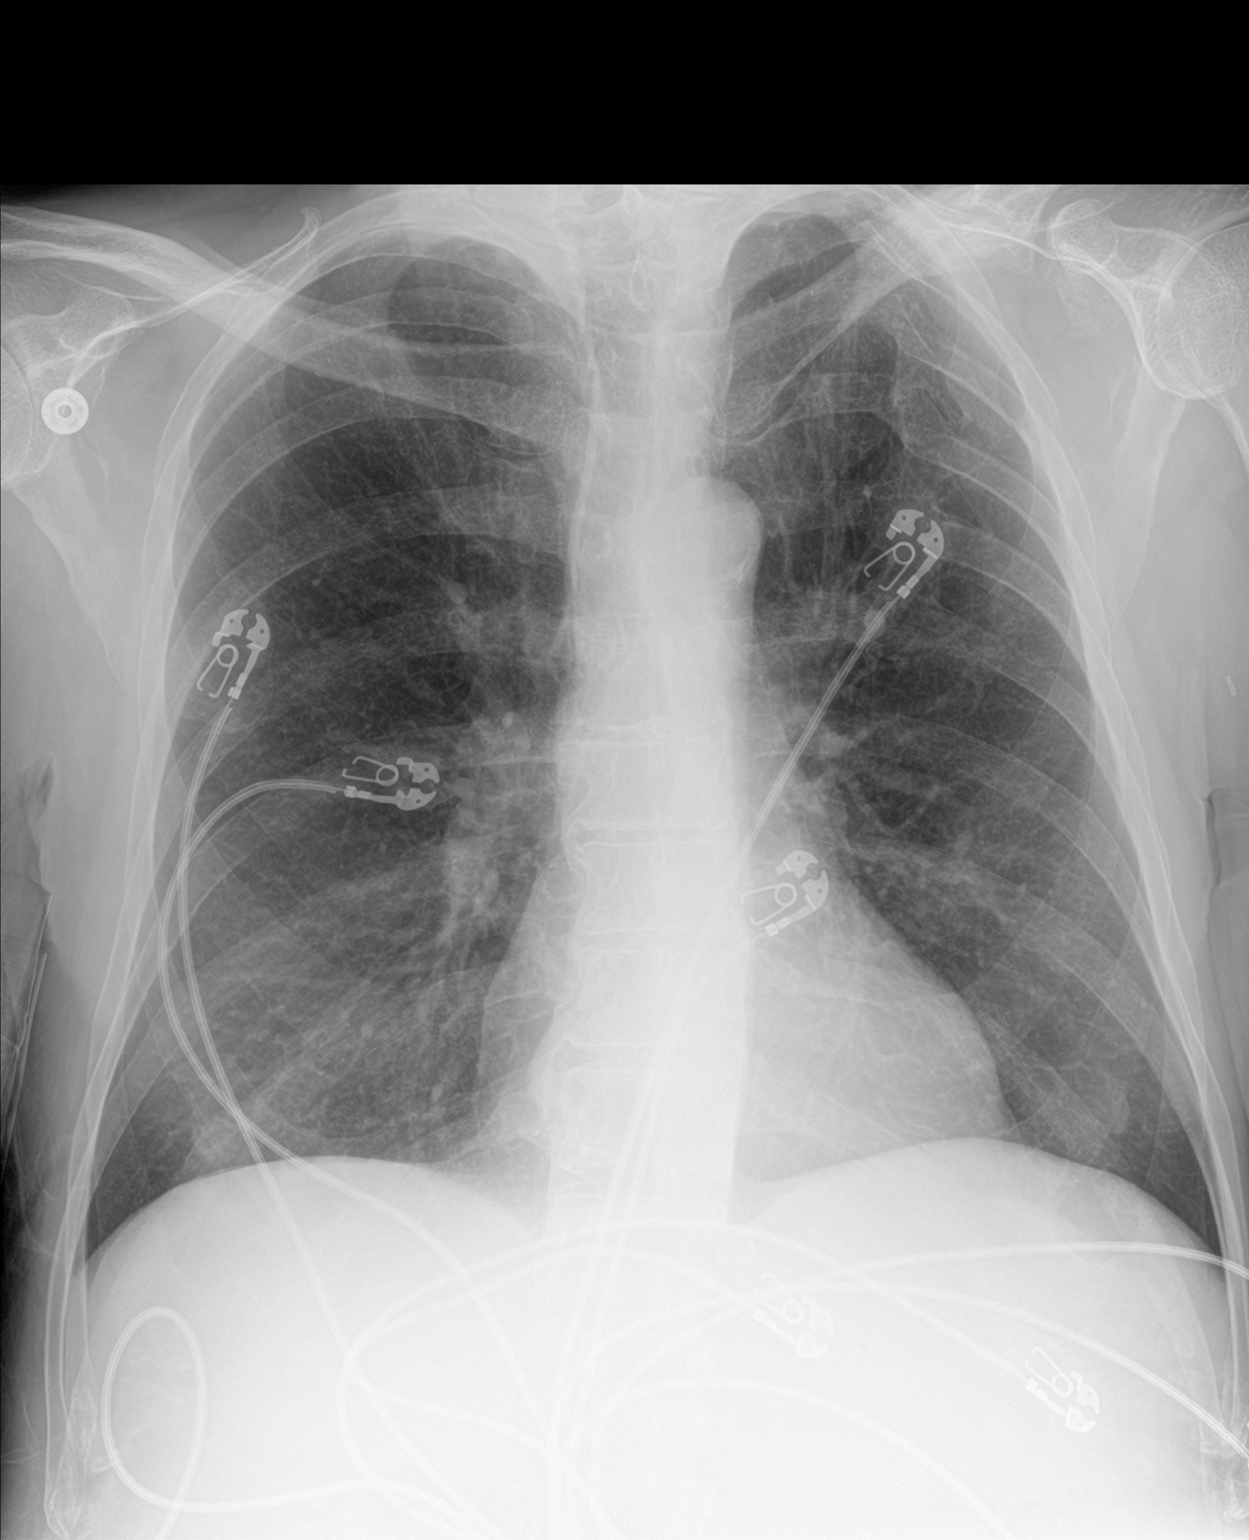

[chest lat]
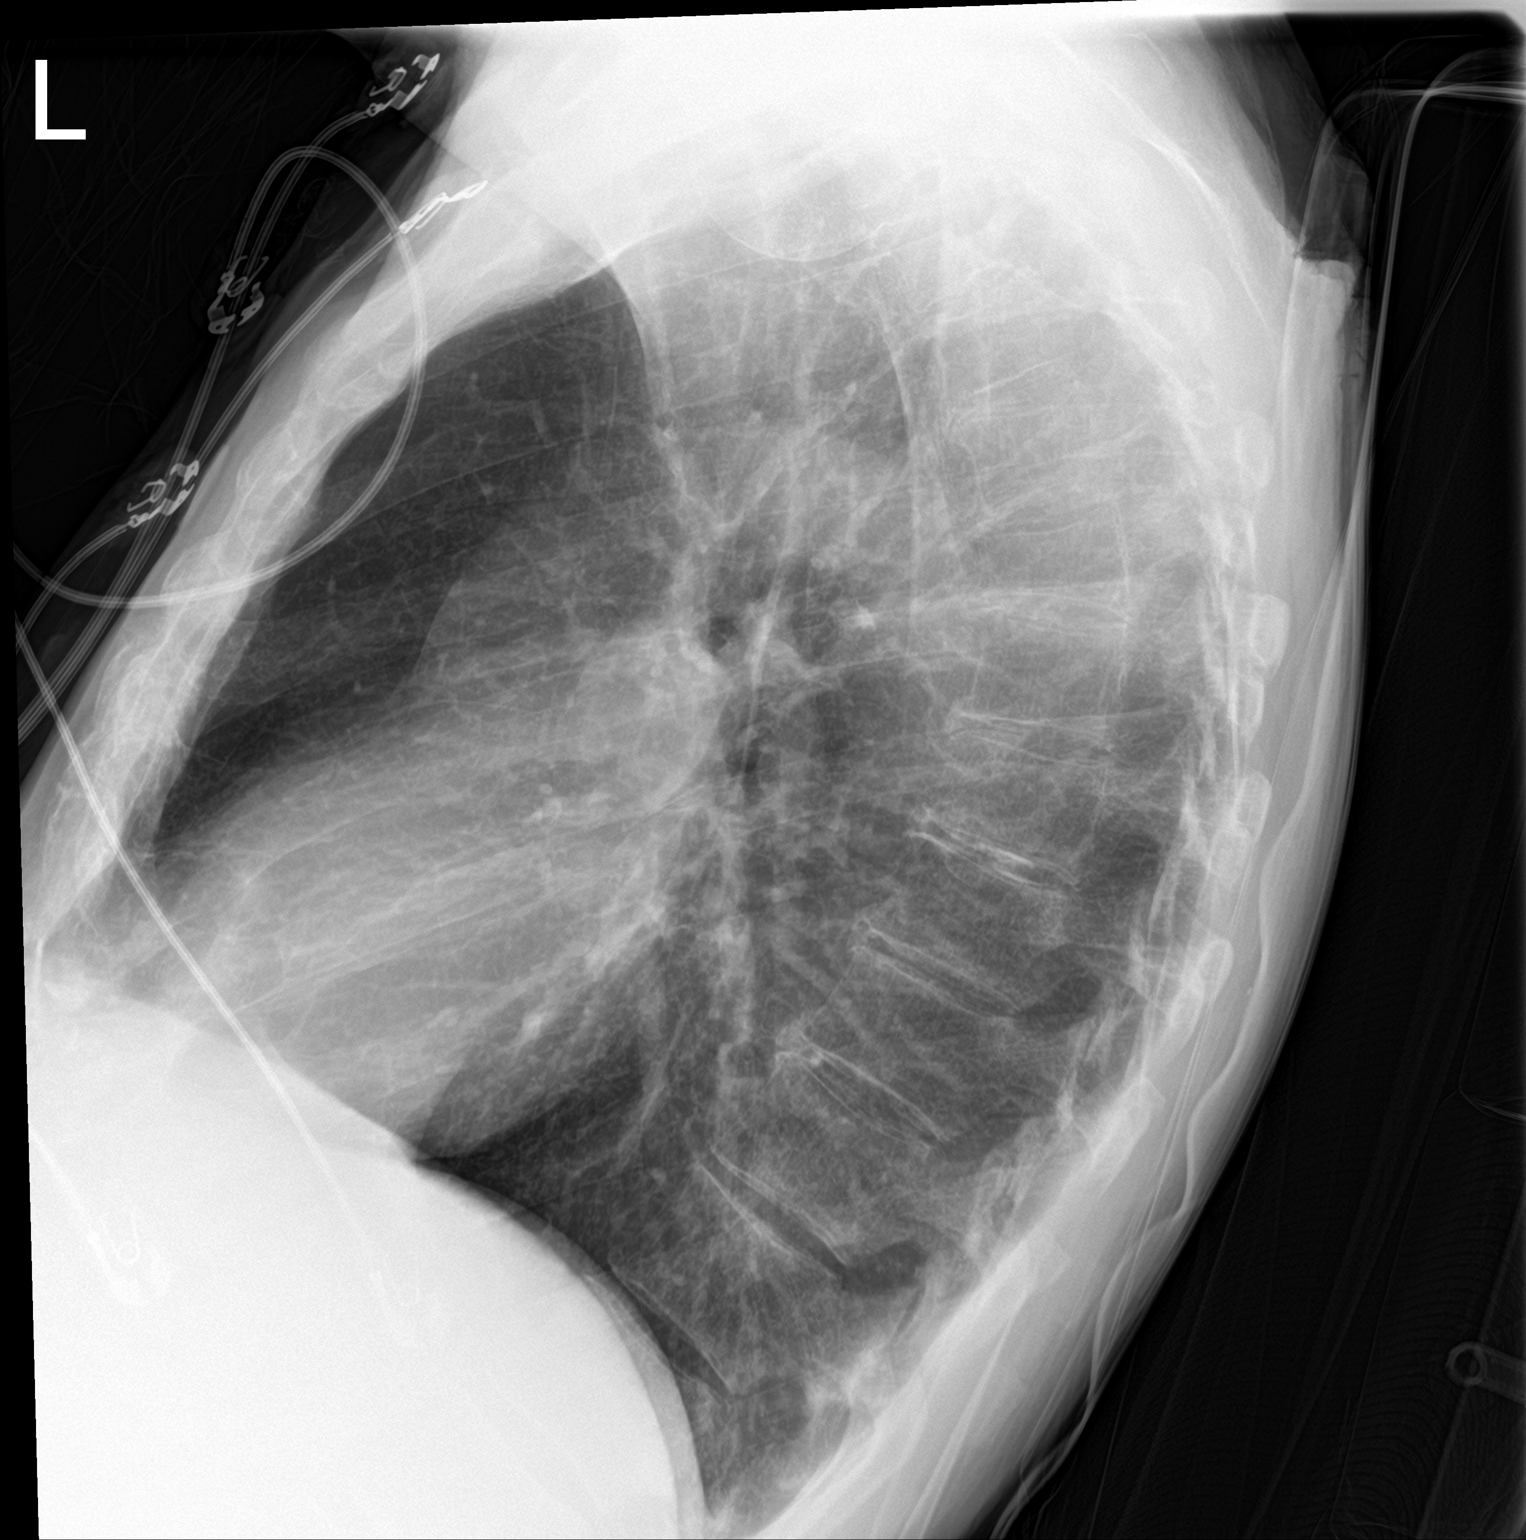

[2 of 2 positions shown; findings below may reference images not displayed]

FINDINGS: There is mild scarring in the left upper lobe. There is no edema or
consolidation. The heart size and pulmonary vascularity are normal.
No adenopathy. There is aortic atherosclerosis. There is evidence of
an old healed fracture of the left clavicle as well as several old
healed rib fractures on the left. There is postoperative change in
the lower cervical region.
IMPRESSION: No edema or consolidation. Mild scarring left upper lobe. Heart size
normal. There is aortic atherosclerosis. There is evidence of old
bony trauma on the left.

Aortic Atherosclerosis (7Q558-O84.4).

## 2020-02-01 IMAGING — MR MR BRAIN WO CONTRAST
10 of 11 series · 43 of 48 positions shown · non-contrast
Comparison: CT HEAD October 08, 2017 and MRI head July 22, 2016
and MRI head December 01, 2013.

CLINICAL DATA: Generalized weakness, worse on the RIGHT. History of
stroke, cervical spine injury, brain abscess, carotid artery
stenosis.

EXAM:
MRI HEAD WITHOUT CONTRAST
TECHNIQUE: Multiplanar, multiecho pulse sequences of the brain and surrounding
structures were obtained without intravenous contrast.

[Series 5: ax dwi_tracew · axial · 3.0mm · 1.50mm/px · z∈[-19,+125]mm · 10 of 100 slices shown]
[im 1/100]
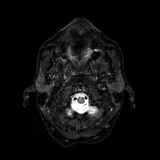
[im 12/100]
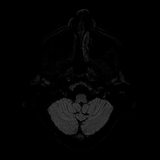
[im 23/100]
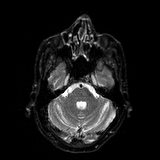
[im 34/100]
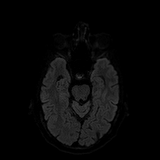
[im 45/100]
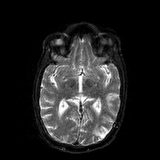
[im 56/100]
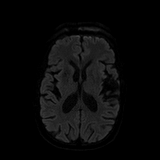
[im 67/100]
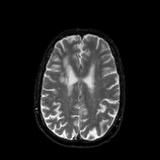
[im 78/100]
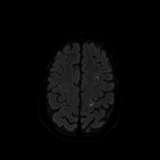
[im 89/100]
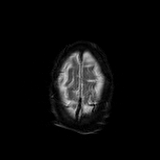
[im 100/100]
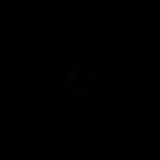

[Series 6: ax dwi_adc · axial · 3.0mm · 1.50mm/px · z∈[-19,+125]mm · 5 of 49 slices shown]
[im 1/49]
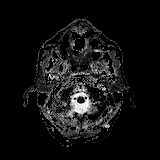
[im 13/49]
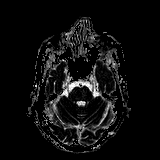
[im 25/49]
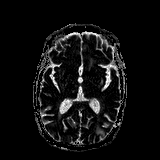
[im 37/49]
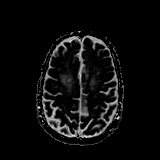
[im 49/49]
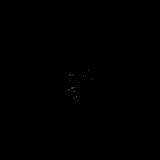

[Series 7: cor dwi_tracew · coronal · 5.0mm · 1.44mm/px · 6 of 72 slices shown]
[im 1/72]
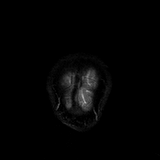
[im 15/72]
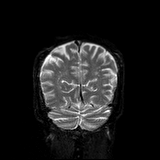
[im 29/72]
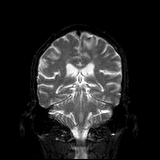
[im 43/72]
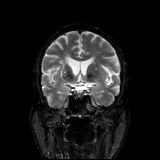
[im 57/72]
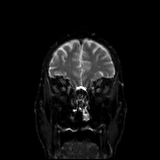
[im 72/72]
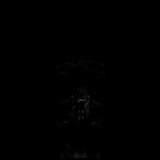

[Series 8: cor dwi_adc · coronal · 5.0mm · 1.44mm/px · 3 of 36 slices shown]
[im 1/36]
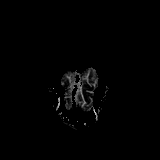
[im 18/36]
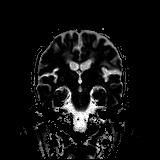
[im 36/36]
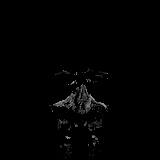

[Series 9: T1 · sagittal · 5.0mm · 0.75mm/px · 2 of 23 slices shown]
[im 1/23]
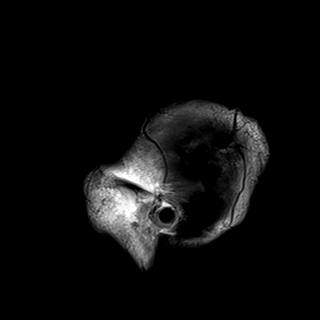
[im 23/23]
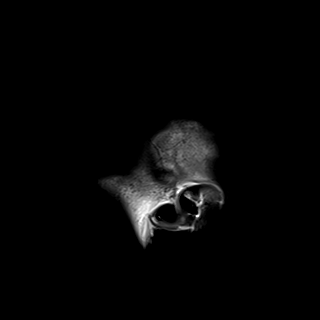

[Series 10: T2 · axial · 5.0mm · 0.72mm/px · z∈[-26,+126]mm · 2 of 27 slices shown (1 of 2)]
[im 1/27]
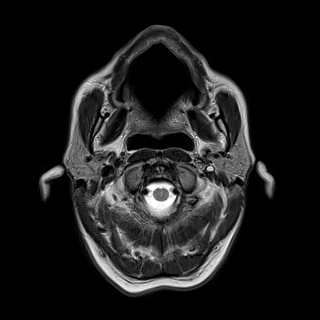
[im 27/27]
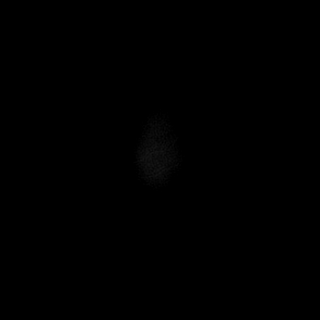

[Series 11: FLAIR · axial · 5.0mm · 0.45mm/px · z∈[-25,+128]mm · 2 of 27 slices shown]
[im 1/27]
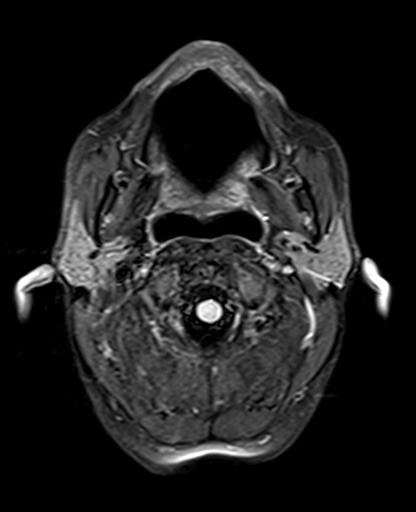
[im 27/27]
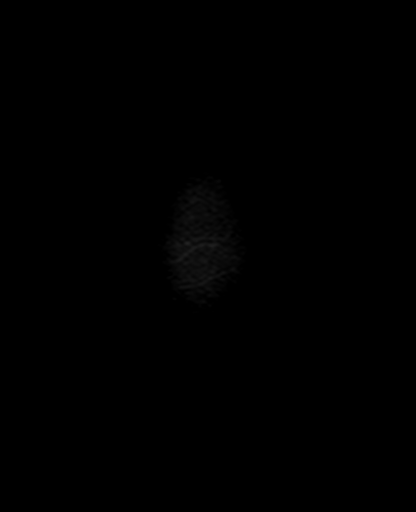

[Series 12: swi_images · axial · 3.0mm · 0.90mm/px · z∈[-39,+135]mm · 5 of 60 slices shown]
[im 1/60]
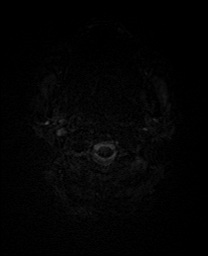
[im 15/60]
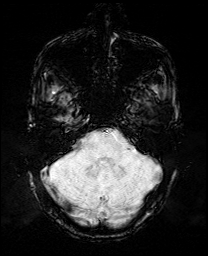
[im 30/60]
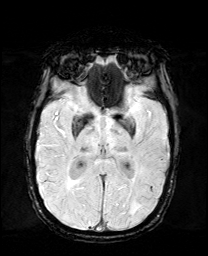
[im 45/60]
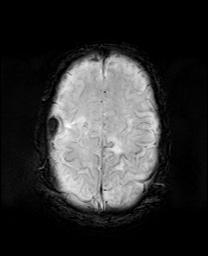
[im 60/60]
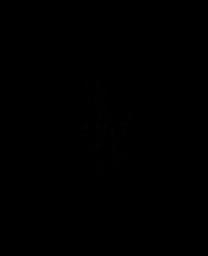

[Series 13: mip_images(sw) · axial · 24.0mm · 0.90mm/px · z∈[-29,+125]mm · 5 of 53 slices shown]
[im 1/53]
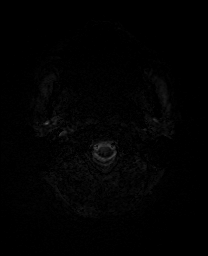
[im 14/53]
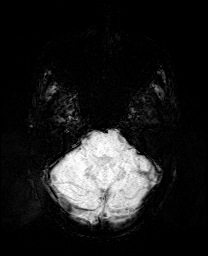
[im 27/53]
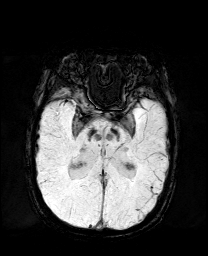
[im 40/53]
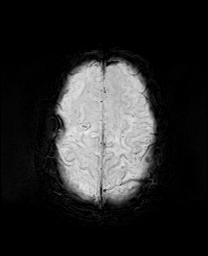
[im 53/53]
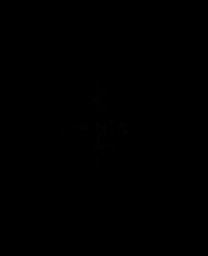

[Series 15: T2 · coronal · 5.0mm · 0.34mm/px · 3 of 29 slices shown (2 of 2)]
[im 1/29]
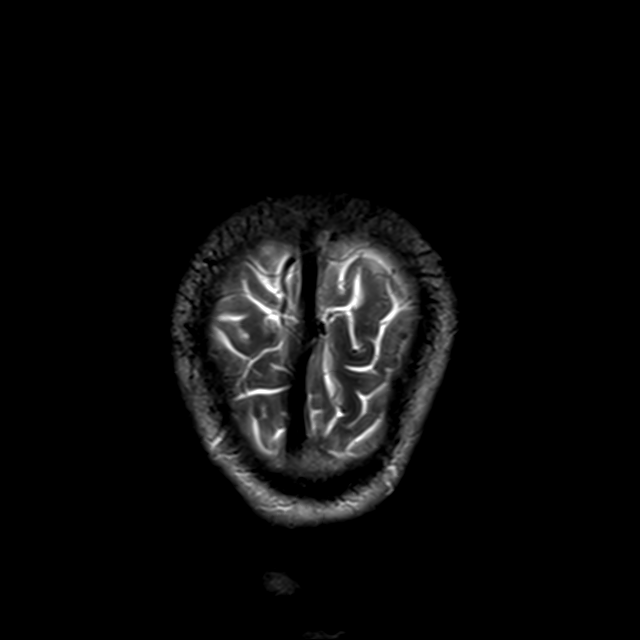
[im 15/29]
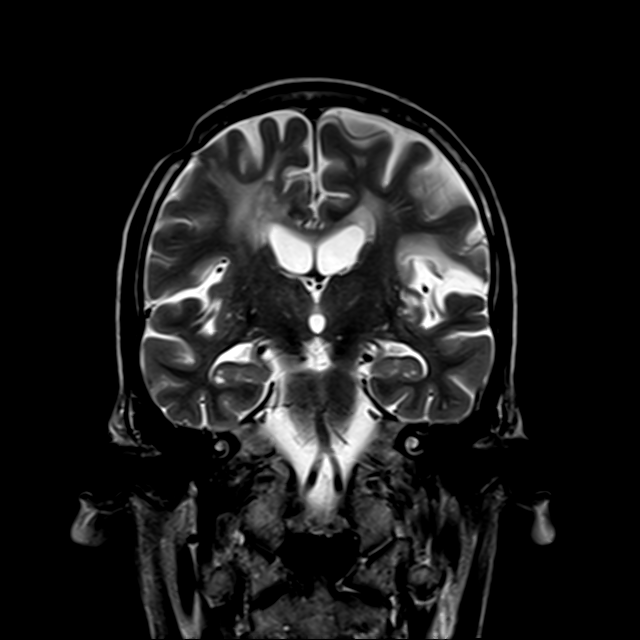
[im 29/29]
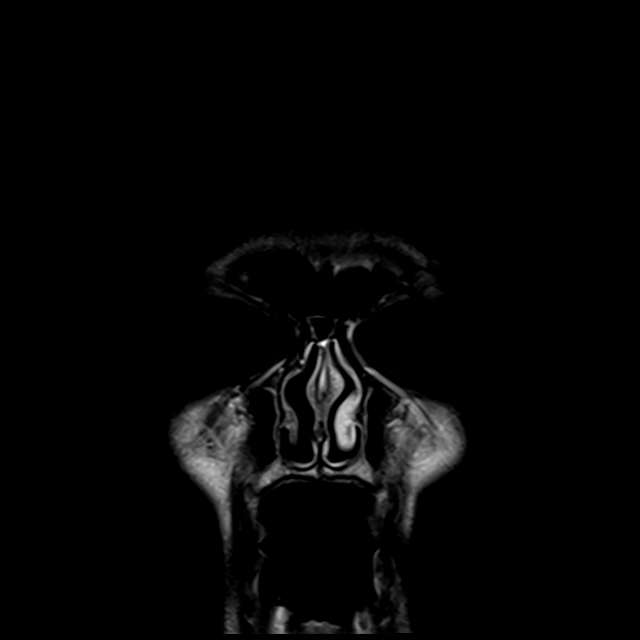

[43 of 48 positions shown; findings below may reference images not displayed]

FINDINGS: INTRACRANIAL CONTENTS: Patchy LEFT frontoparietal reduced diffusion
with low ADC values. Bifrontal encephalomalacia with susceptibility
artifact and areas of old infection. LEFT frontal/insular
encephalomalacia. LEFT parietoccipital encephalomalacia. Old
bilateral basal ganglia and thalami lacunar infarcts. Mild
parenchymal brain volume loss for age. Patchy supratentorial and
pontine white matter FLAIR T2 hyperintensities. No midline shift or
mass effect. No abnormal extra-axial fluid collections.

VASCULAR: Chronic loss of LEFT internal carotid artery flow void.

SKULL AND UPPER CERVICAL SPINE: No abnormal sellar expansion. No
suspicious calvarial bone marrow signal. Craniocervical junction
maintained.

SINUSES/ORBITS: The mastoid air-cells and included paranasal sinuses
are well-aerated.The included ocular globes and orbital contents are
non-suspicious.

OTHER: None.
IMPRESSION: 1. Acute small LEFT frontoparietal/MCA territory nonhemorrhagic
infarcts.
2. Old LEFT MCA and posterior border zone territory infarcts. Old
basal ganglia and thalami infarcts.
3. Bifrontal and RIGHT basal ganglia encephalomalacia at site of
prior brain abscess.
4. Mild parenchymal brain volume loss.
5. Chronically occluded LEFT ICA.

## 2020-02-01 IMAGING — CT Head^HEAD_ROUTINE (Adult)
3 of 4 series · 16 of 47 positions shown, 19 images · non-contrast
Comparison: 09/27/2017

CLINICAL DATA: Generalized fatigue and weakness with body aches

EXAM:
CT HEAD WITHOUT CONTRAST
TECHNIQUE: Contiguous axial images were obtained from the base of the skull
through the vertex without intravenous contrast.

[Series 4: head 2.0 h70h · axial · 0.44mm/px · z∈[-143,+17]mm · 10 of 94 slices shown, 13 images]
[im 9/94  brain]
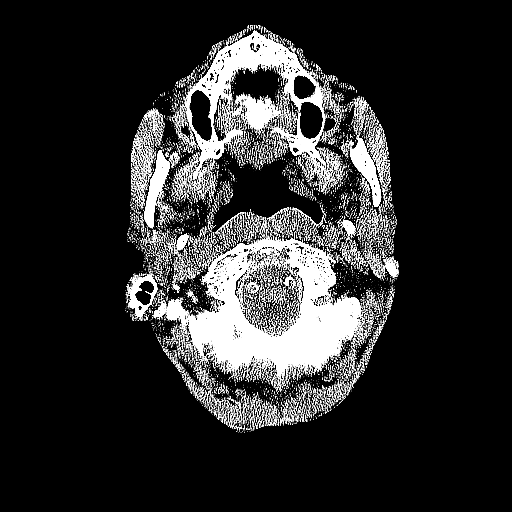
[im 9/94  bone]
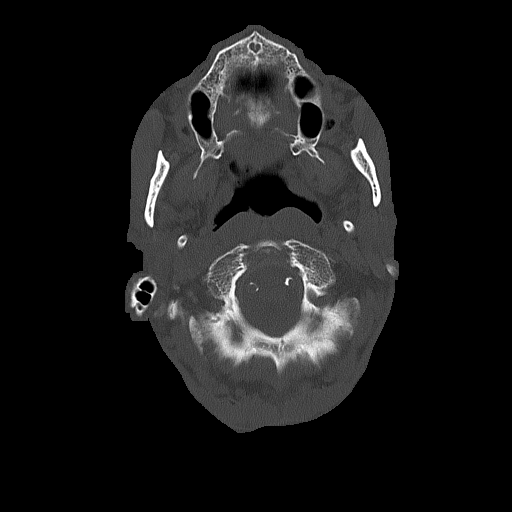
[im 18/94  brain]
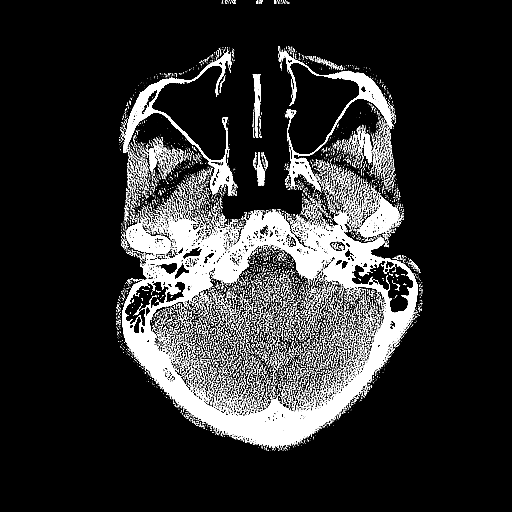
[im 27/94  brain]
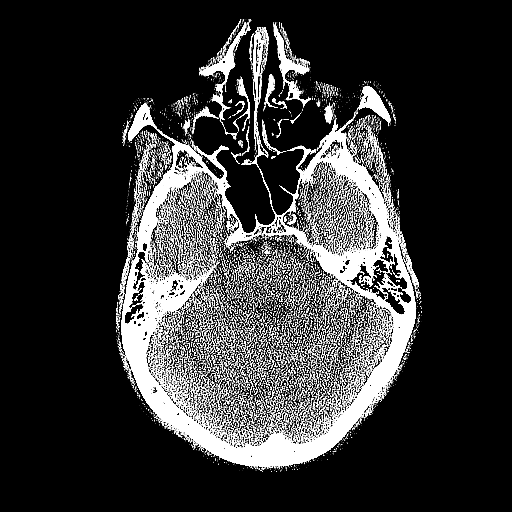
[im 36/94  brain]
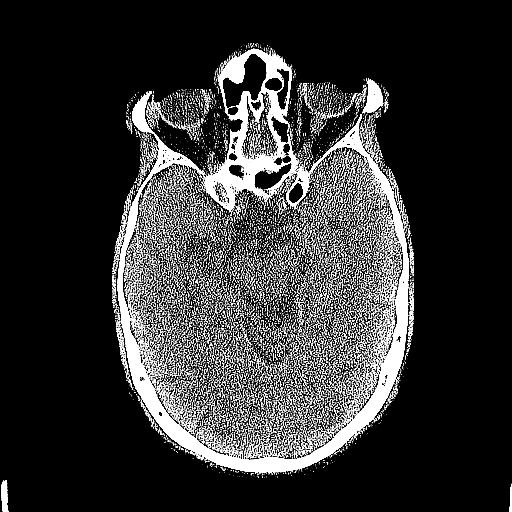
[im 45/94  brain]
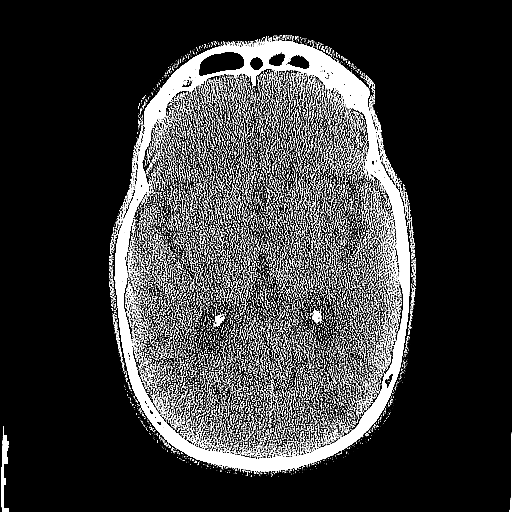
[im 45/94  bone]
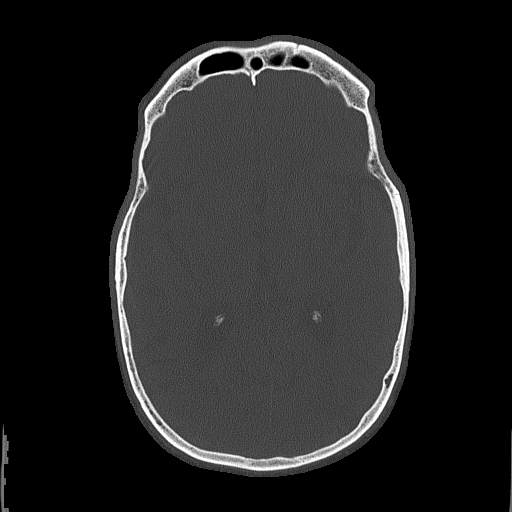
[im 54/94  brain]
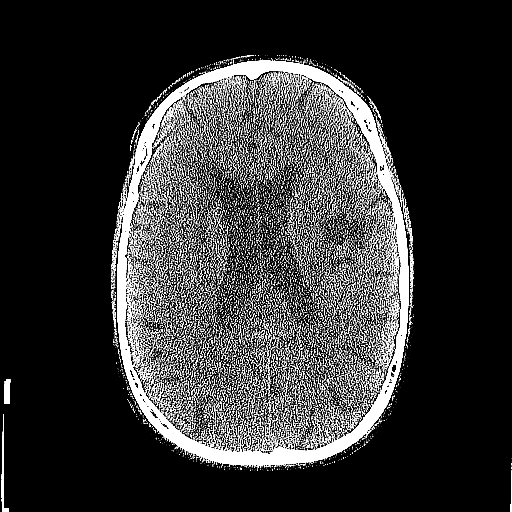
[im 63/94  brain]
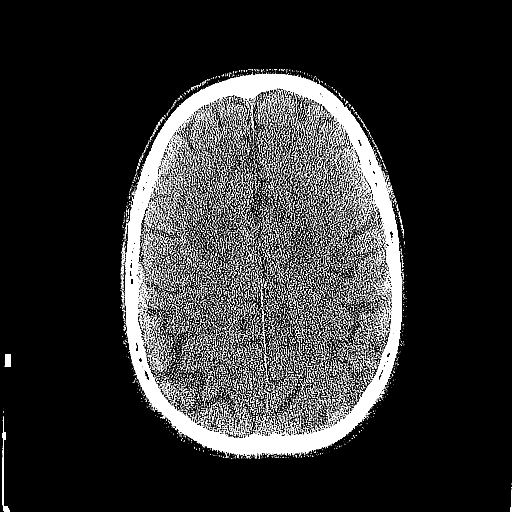
[im 71/94  brain]
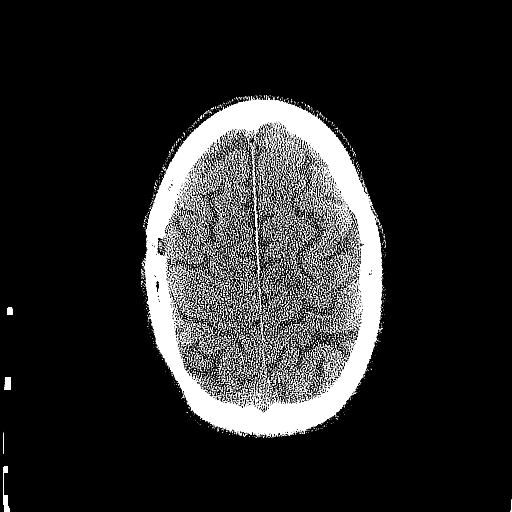
[im 80/94  brain]
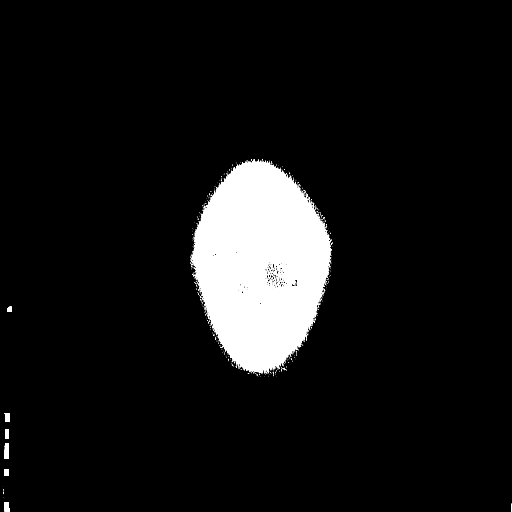
[im 80/94  bone]
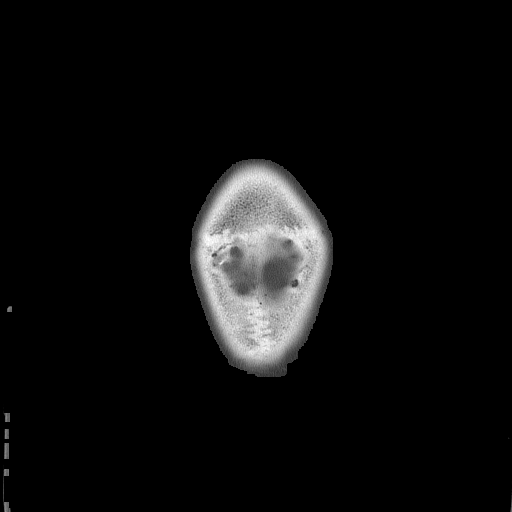
[im 89/94  brain]
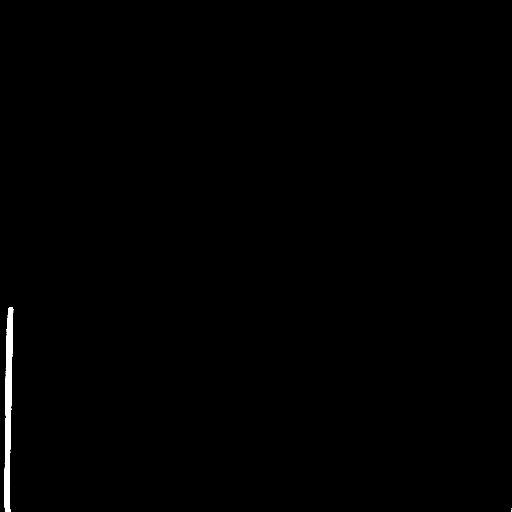

[Series 5: head 3.0 mpr cor · coronal · 0.36mm/px · 3 of 74 slices shown]
[im 25/74  brain]
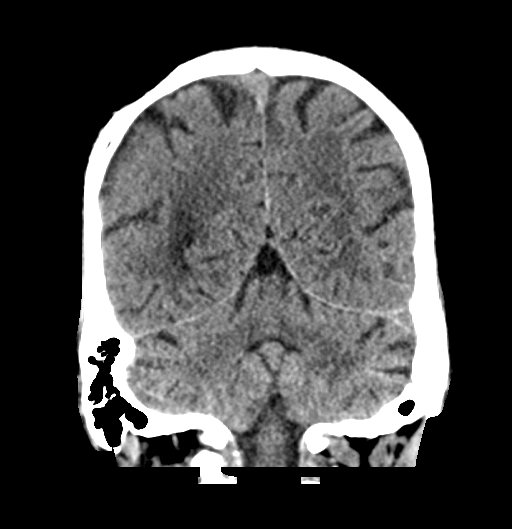
[im 33/74  brain]
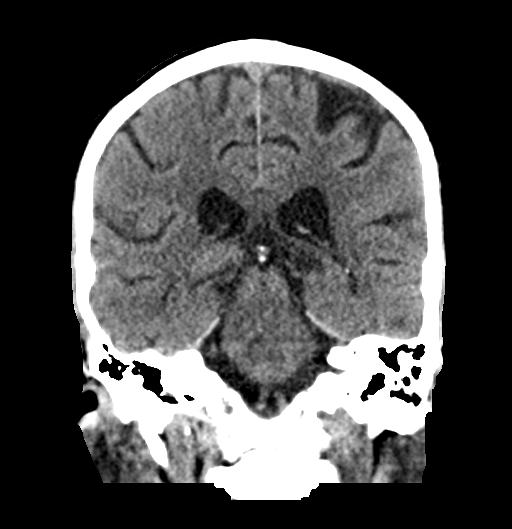
[im 41/74  brain]
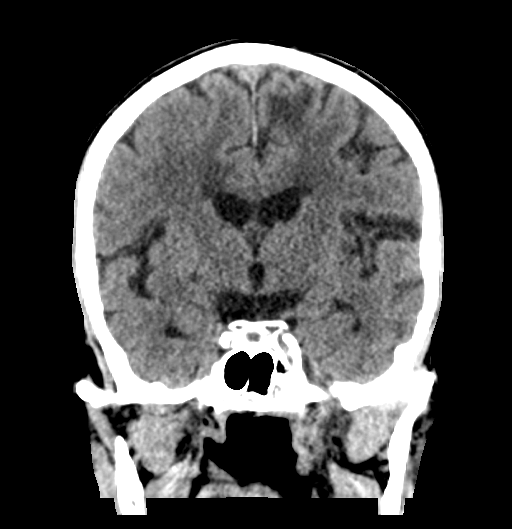

[Series 6: head 3.0 mpr sag · sagittal · 0.37mm/px · 3 of 55 slices shown]
[im 19/55  brain]
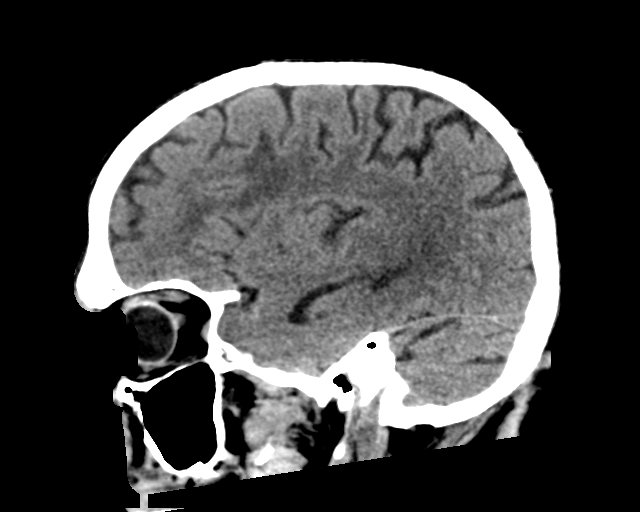
[im 28/55  brain]
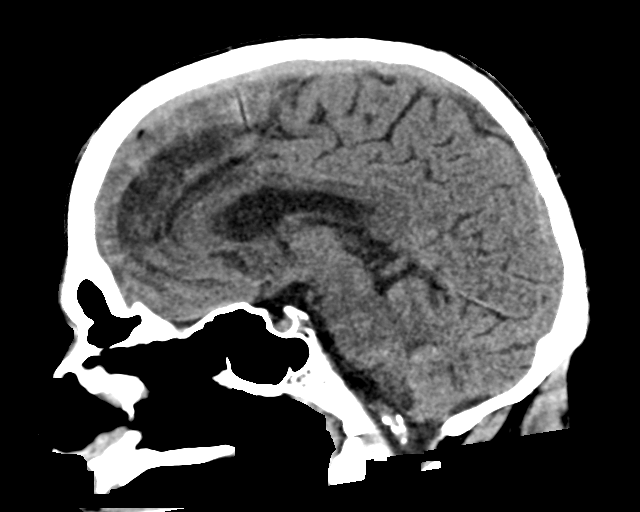
[im 37/55  brain]
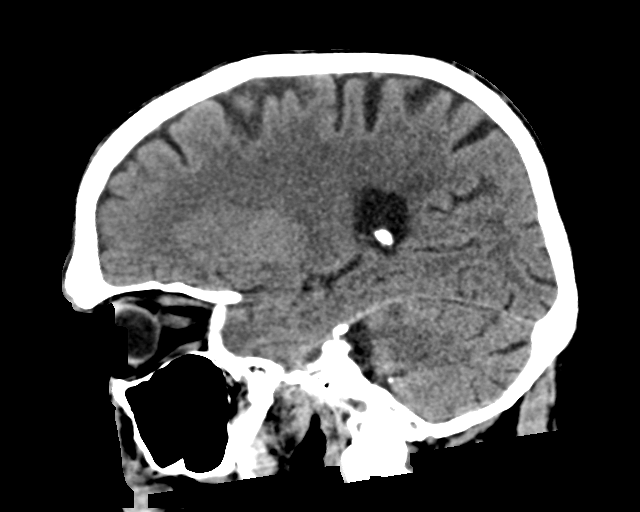

[16 of 47 positions shown; findings below may reference images not displayed]

FINDINGS: Brain: Chronic stable involutional changes of the brain. Subcortical
areas of hypoattenuation in the bifrontal and high parietal lobes
compatible with known areas of remote infarct and/or hemorrhage. No
acute intracranial hemorrhage is currently identified. Hydrocephalus
extra-axial fluid collections.

Vascular: Atherosclerosis at the skull base.

Skull: No acute skull fracture. Burr holes noted bilaterally as
before.

Sinuses/Orbits: No acute finding.

Other: None.
IMPRESSION: No acute findings.  Multiple infarcts as before

## 2020-02-01 IMAGING — DX DG CHEST 2 VIEW
3 series · 3 of 3 positions shown · non-contrast
Comparison: 06/16/2016, 06/19/2016, 02/24/2017

CLINICAL DATA: Generalized fatigue, weakness

EXAM:
CHEST - 2 VIEW

[w chest lat]
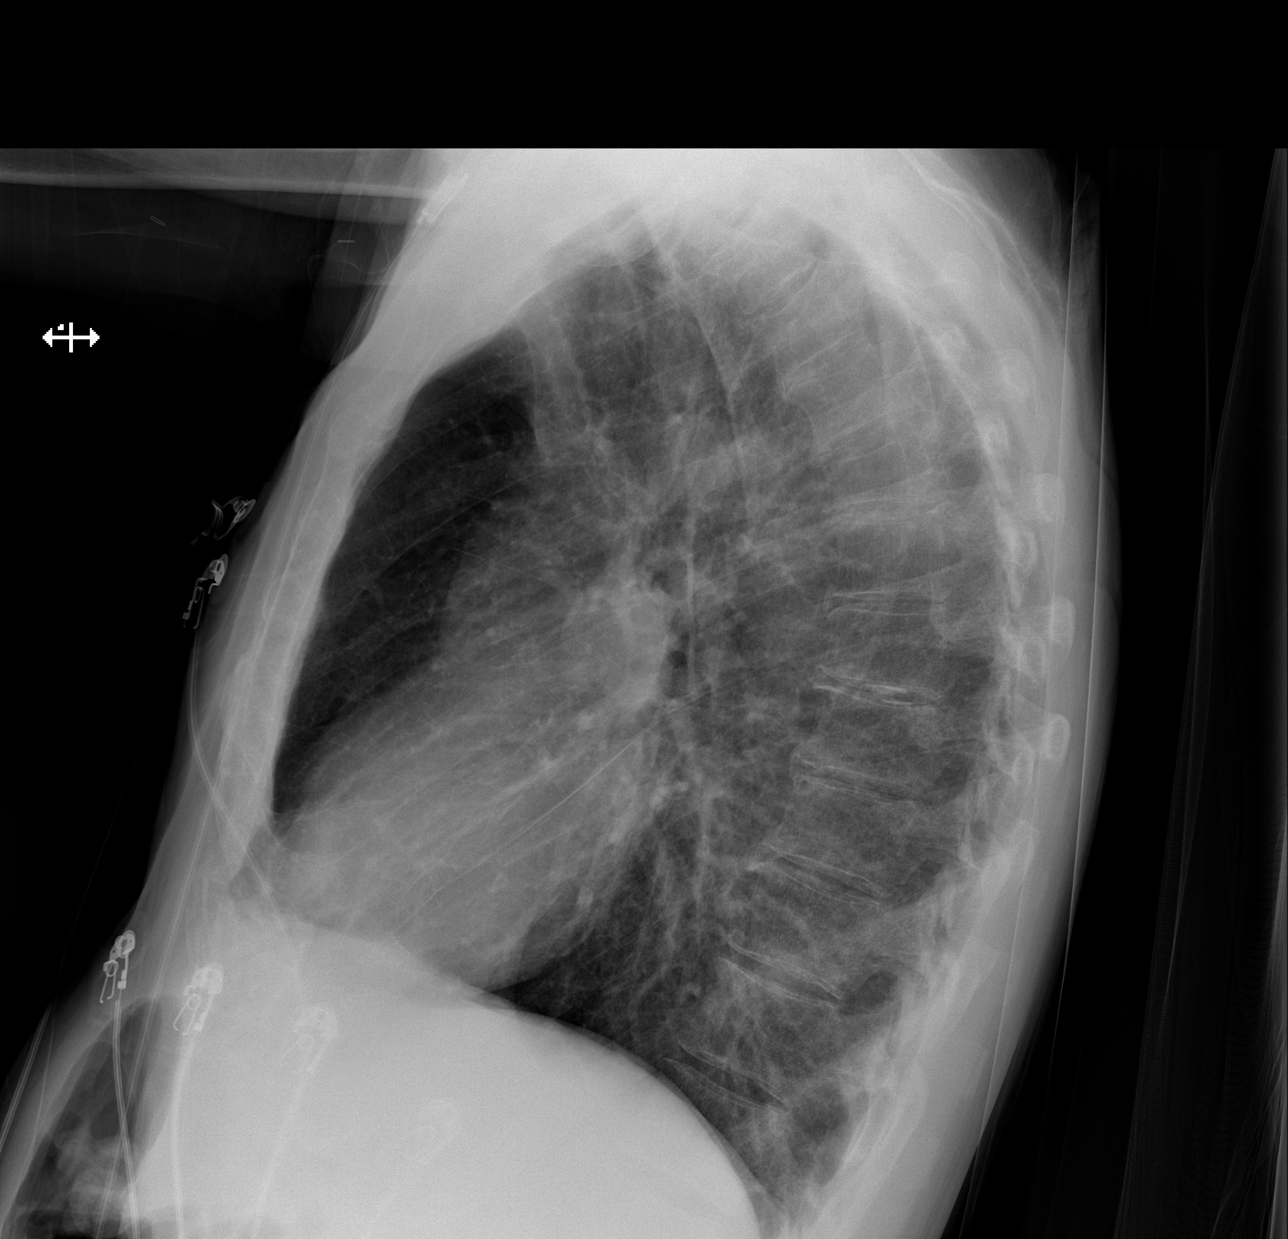

[x chest ap (1 of 2)]
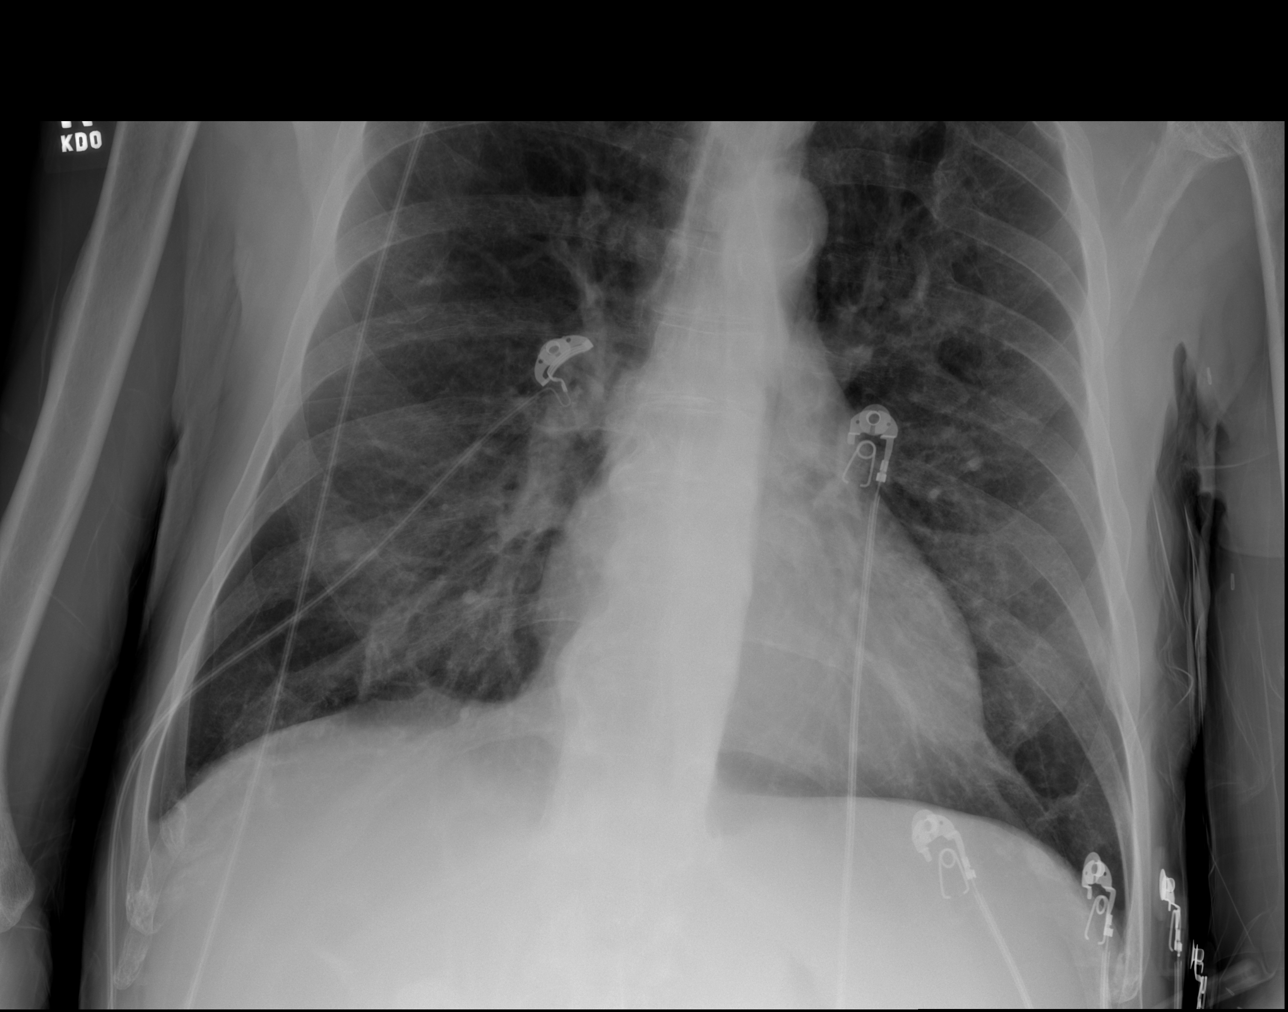

[x chest ap (2 of 2)]
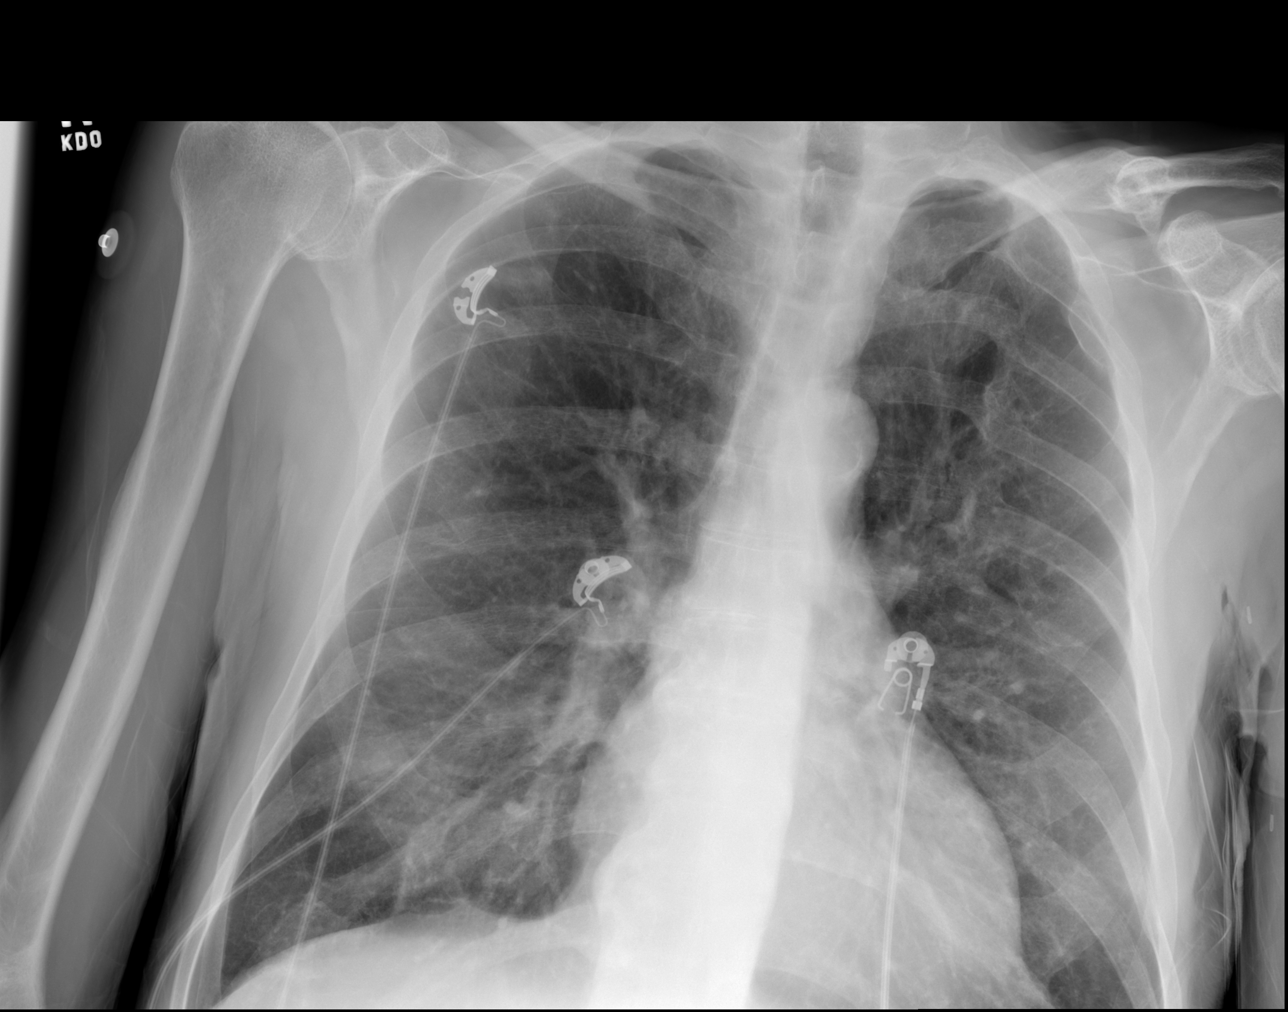

[3 of 3 positions shown; findings below may reference images not displayed]

FINDINGS: There is a 9 mm nodular opacity of the right lower lobe likely
reflecting a nipple shadow. There is no focal consolidation. There
is no pleural effusion or pneumothorax. The heart and mediastinal
contours are unremarkable.

The osseous structures are unremarkable.
IMPRESSION: 1. No acute cardiopulmonary disease.
2. 9 mm nodular opacity in the right lower lobe likely reflecting a
nipple shadow. Recommend repeat chest x-ray with nipple markers.

## 2020-02-02 IMAGING — CT CT ANGIO NECK W OR WO CONTRAST
2 of 8 series · 8 of 36 positions shown · IV contrast (iopamidol)
Comparison: MRI head October 08, 2017 and [facility] neuro angiogram May

CLINICAL DATA: Follow up stroke. History of stroke, brain abscess,
LEFT ICA occlusion.

EXAM:
CT ANGIOGRAPHY HEAD AND NECK
TECHNIQUE: Multidetector CT imaging of the head and neck was performed using
the standard protocol during bolus administration of intravenous
contrast. Multiplanar CT image reconstructions and MIPs were
obtained to evaluate the vascular anatomy. Carotid stenosis
measurements (when applicable) are obtained utilizing NASCET
criteria, using the distal internal carotid diameter as the
denominator.
CONTRAST:  50mL 64X11R-5GQ IOPAMIDOL (64X11R-[id number]) INJECTION 76%

[Series 9: ax thins · axial · 0.39mm/px · z∈[-316,-40]mm · 6 of 389 slices shown]
[im 56/389  soft-tissue]
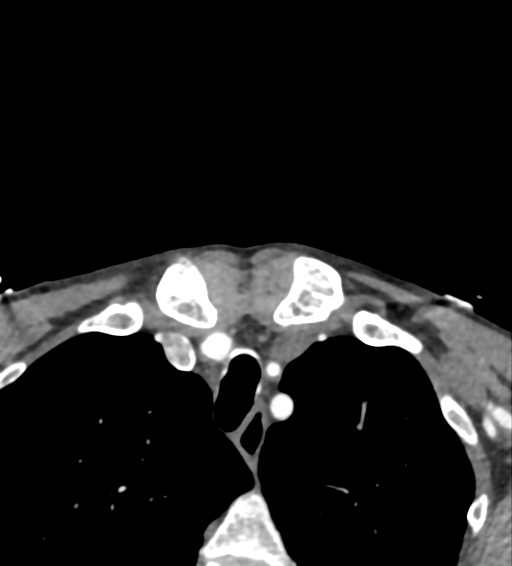
[im 111/389  bone]
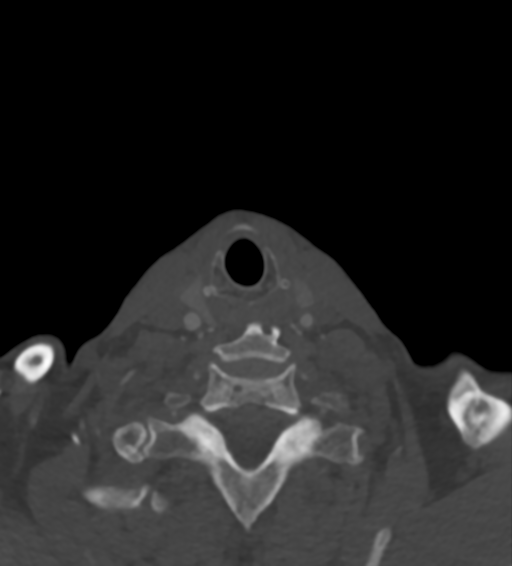
[im 167/389  soft-tissue]
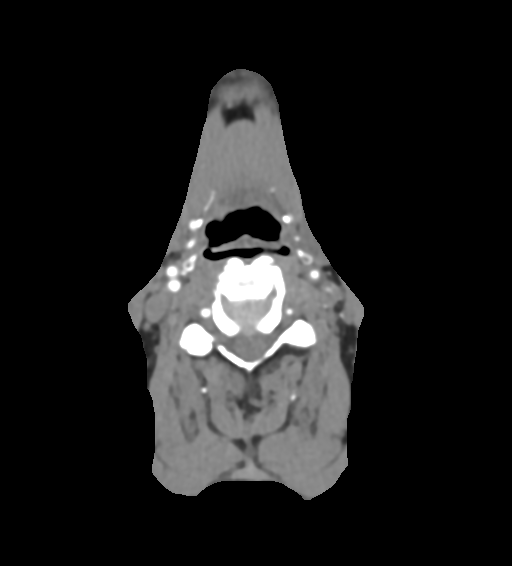
[im 222/389  bone]
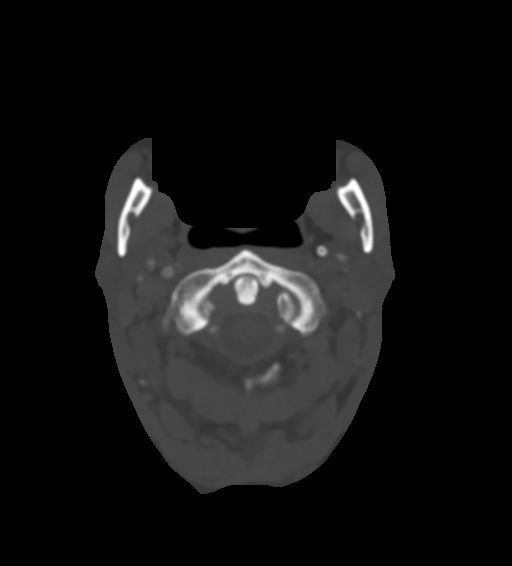
[im 278/389  soft-tissue]
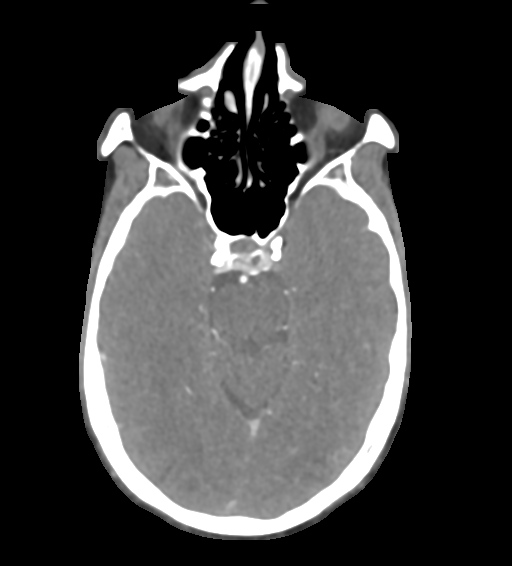
[im 333/389  bone]
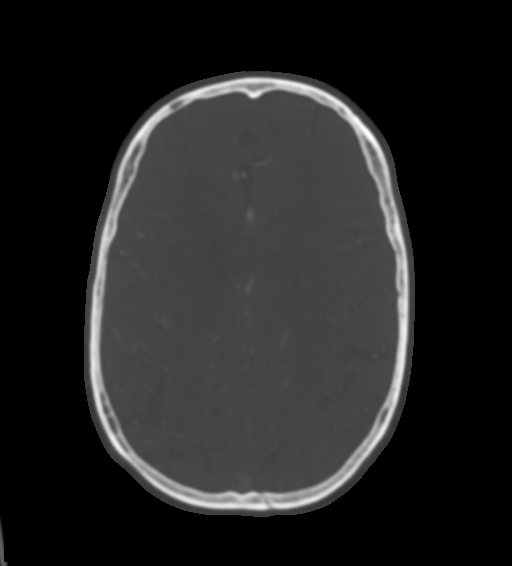

[Series 11: sag thins · sagittal · 0.51mm/px · 2 of 201 slices shown]
[im 28/201  soft-tissue]
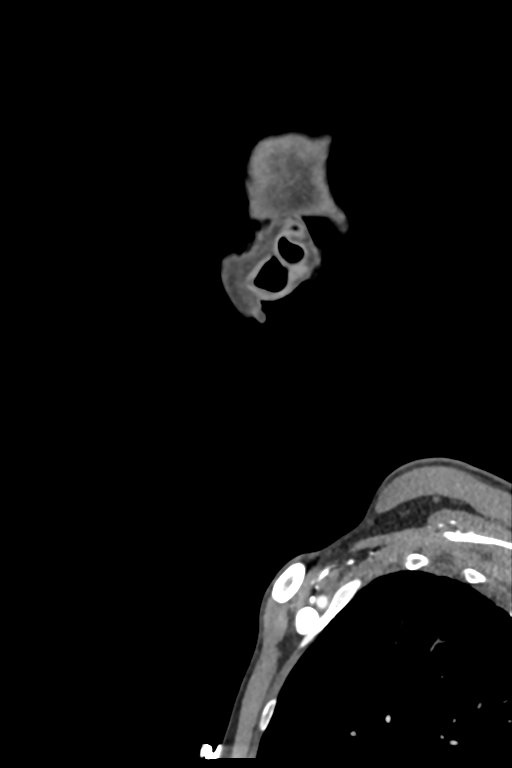
[im 174/201  soft-tissue]
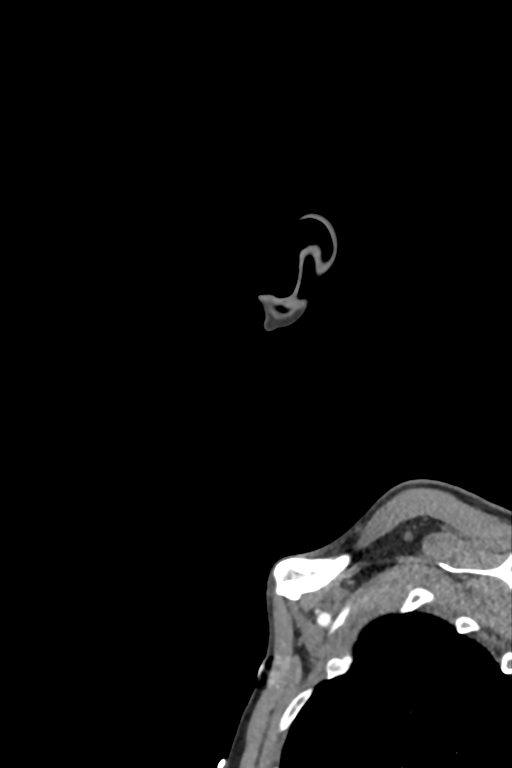

[8 of 36 positions shown; findings below may reference images not displayed]

FINDINGS: CTA NECK FINDINGS:

AORTIC ARCH: Normal appearance of the thoracic arch, normal branch
pattern. Mild calcific atherosclerosis and intimal thickening aortic
arch. The origins of the innominate, left Common carotid artery and
subclavian artery are patent. Linear filling defect RIGHT subclavian
artery distal to vertebral artery origin concerning for non
flow-limiting dissection. Moderate luminal irregularity bilateral
subclavian arteries compatible with atherosclerosis.

RIGHT CAROTID SYSTEM: Common carotid artery is patent, mild luminal
irregularity compatible with atherosclerosis.. Moderate calcific
atherosclerosis carotid bifurcation without hemodynamically
significant stenosis by NASCET criteria. Normal appearance of the
internal carotid artery.

LEFT CAROTID SYSTEM: Common carotid artery is patent. Moderate
calcific atherosclerosis LEFT carotid bifurcation. LEFT internal
carotid artery is occluded within 9 mm of the origin with calcific
capping and calcifications along the occluded course. No
reconstitution in the neck.

VERTEBRAL ARTERIES:RIGHT vertebral artery is dominant. Severe
stenosis RIGHT vertebral artery origin. Moderate luminal
irregularity LEFT vertebral artery, vessel is smaller than the
transverse foramen. Mild luminal irregularity RIGHT vertebral artery
with calcific atherosclerosis..

SKELETON: No acute osseous process though bone windows have not been
submitted. Status post C4-5 ACDF with arthrodesis.

OTHER NECK: Soft tissues of the neck are nonacute though, not
tailored for evaluation.

UPPER CHEST: Included lung apices are clear. Centrilobular
emphysema. No superior mediastinal lymphadenopathy.

CTA HEAD FINDINGS:

ANTERIOR CIRCULATION: Atherosclerosis resulting in severe stenosis
versus focally occluded RIGHT petrous segment. Severe stenosis RIGHT
carotid siphon due to calcific atherosclerosis. Faint reconstitution
LEFT carotid terminus. Patent anterior and middle cerebral arteries,
mild luminal irregularity compatible with atherosclerosis.

No large vessel occlusion, contrast extravasation or aneurysm.

POSTERIOR CIRCULATION: Patent vertebral arteries, vertebrobasilar
junction and basilar artery, as well as main branch vessels.
Moderate luminal irregularity RIGHT vertebral artery. Focal severe
stenosis LEFT V4 segment due to atherosclerosis. Patent posterior
cerebral arteries, mild luminal irregularity compatible with
atherosclerosis.

No large vessel occlusion, contrast extravasation or aneurysm.

VENOUS SINUSES: Major dural venous sinuses are patent though not
tailored for evaluation on this angiographic examination.

ANATOMIC VARIANTS: None.

DELAYED PHASE: No abnormal intracranial enhancement.

MIP images reviewed.
IMPRESSION: CTA NECK:

1. Chronically occluded LEFT ICA. No hemodynamically significant
stenosis RIGHT ICA.
2. Luminal irregularity LEFT vertebral artery most compatible with
old dissection without flow-limiting stenosis.
3. Luminal irregularity RIGHT vertebral artery seen with old
dissection or atherosclerosis. Severe stenosis RIGHT vertebral
artery origin.
4. Suspected RIGHT subclavian artery non flow-limiting dissection
flap. No pseudoaneurysm.

CTA HEAD:

1. No emergent large vessel occlusion.  Patent cerebral arteries.
2. Occluded LEFT ICA, reconstitution at carotid terminus.
3. Severe stenosis versus focally occluded RIGHT petrous ICA, severe
stenosis RIGHT carotid siphon.
4. Severe stenosis LEFT V4 segment.

Emphysema (RIJY2-K2L.3).  Aortic Atherosclerosis (RIJY2-FW2.2).

## 2020-03-03 IMAGING — CR DG FEMUR 2+V*R*
2 series · 2 of 2 positions shown · non-contrast
Comparison: Right femur radiograph dated 06/17/2017

CLINICAL DATA: 57-year-old male with recent right-sided above knee
amputation presenting with pain.

EXAM:
RIGHT FEMUR 2 VIEWS

[femur ap]
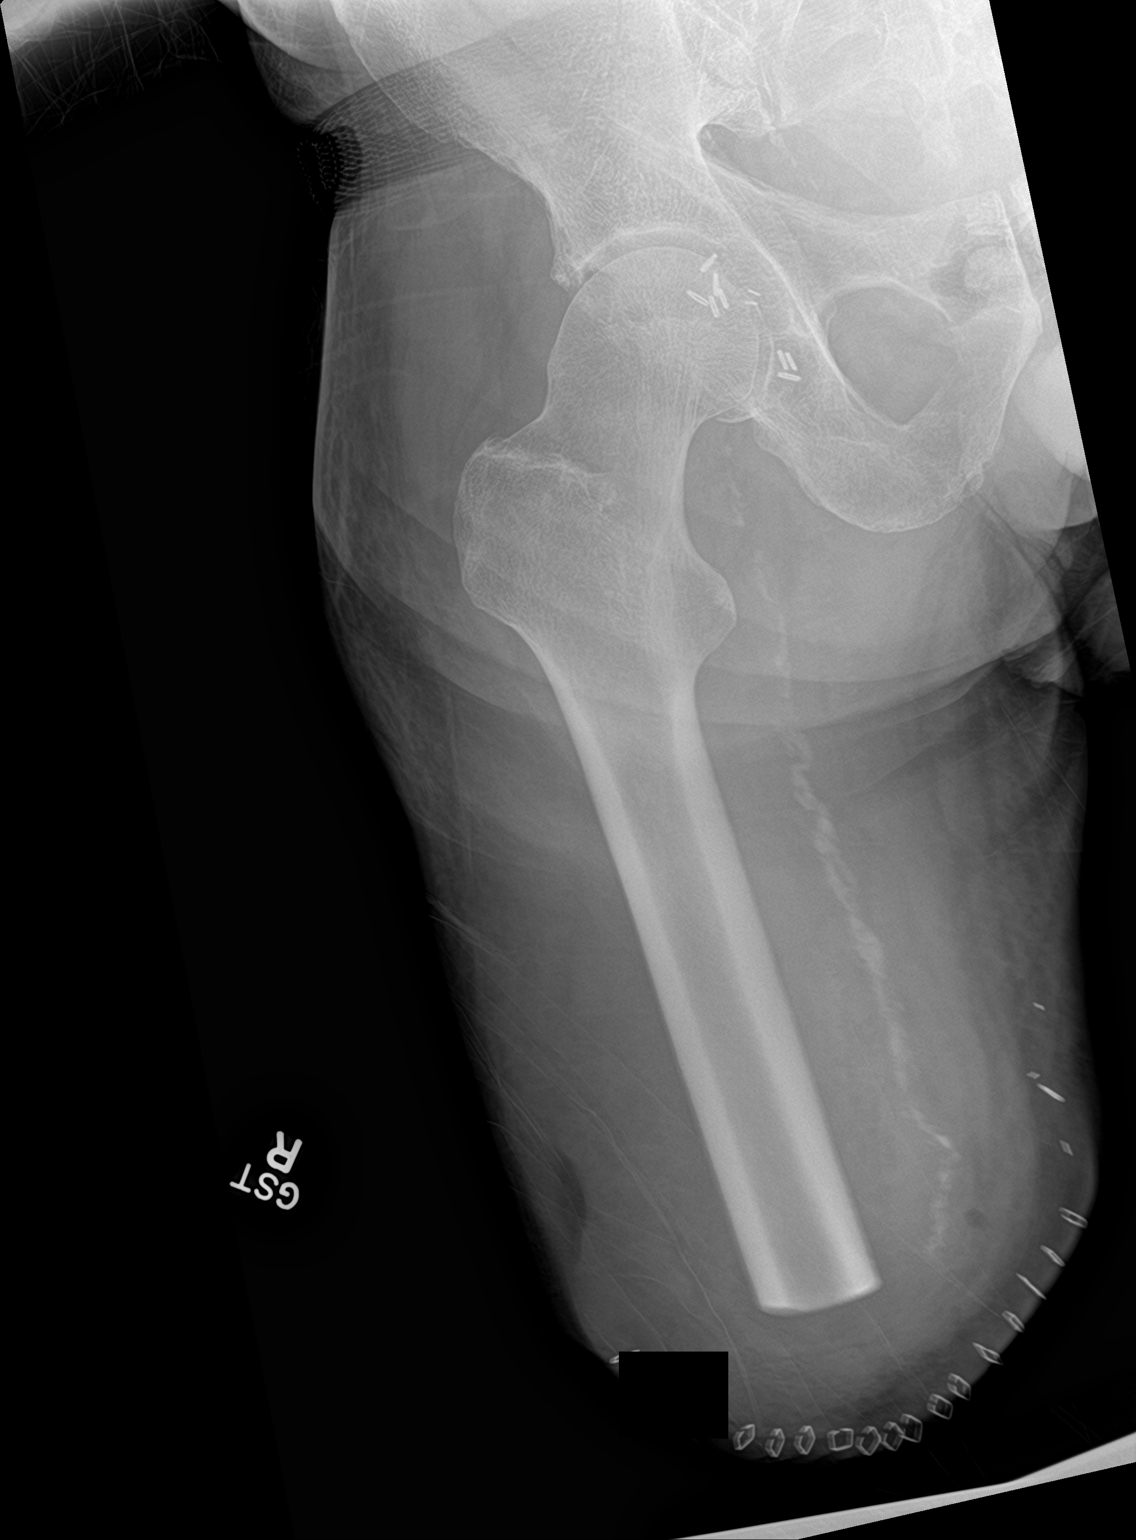

[femur lat]
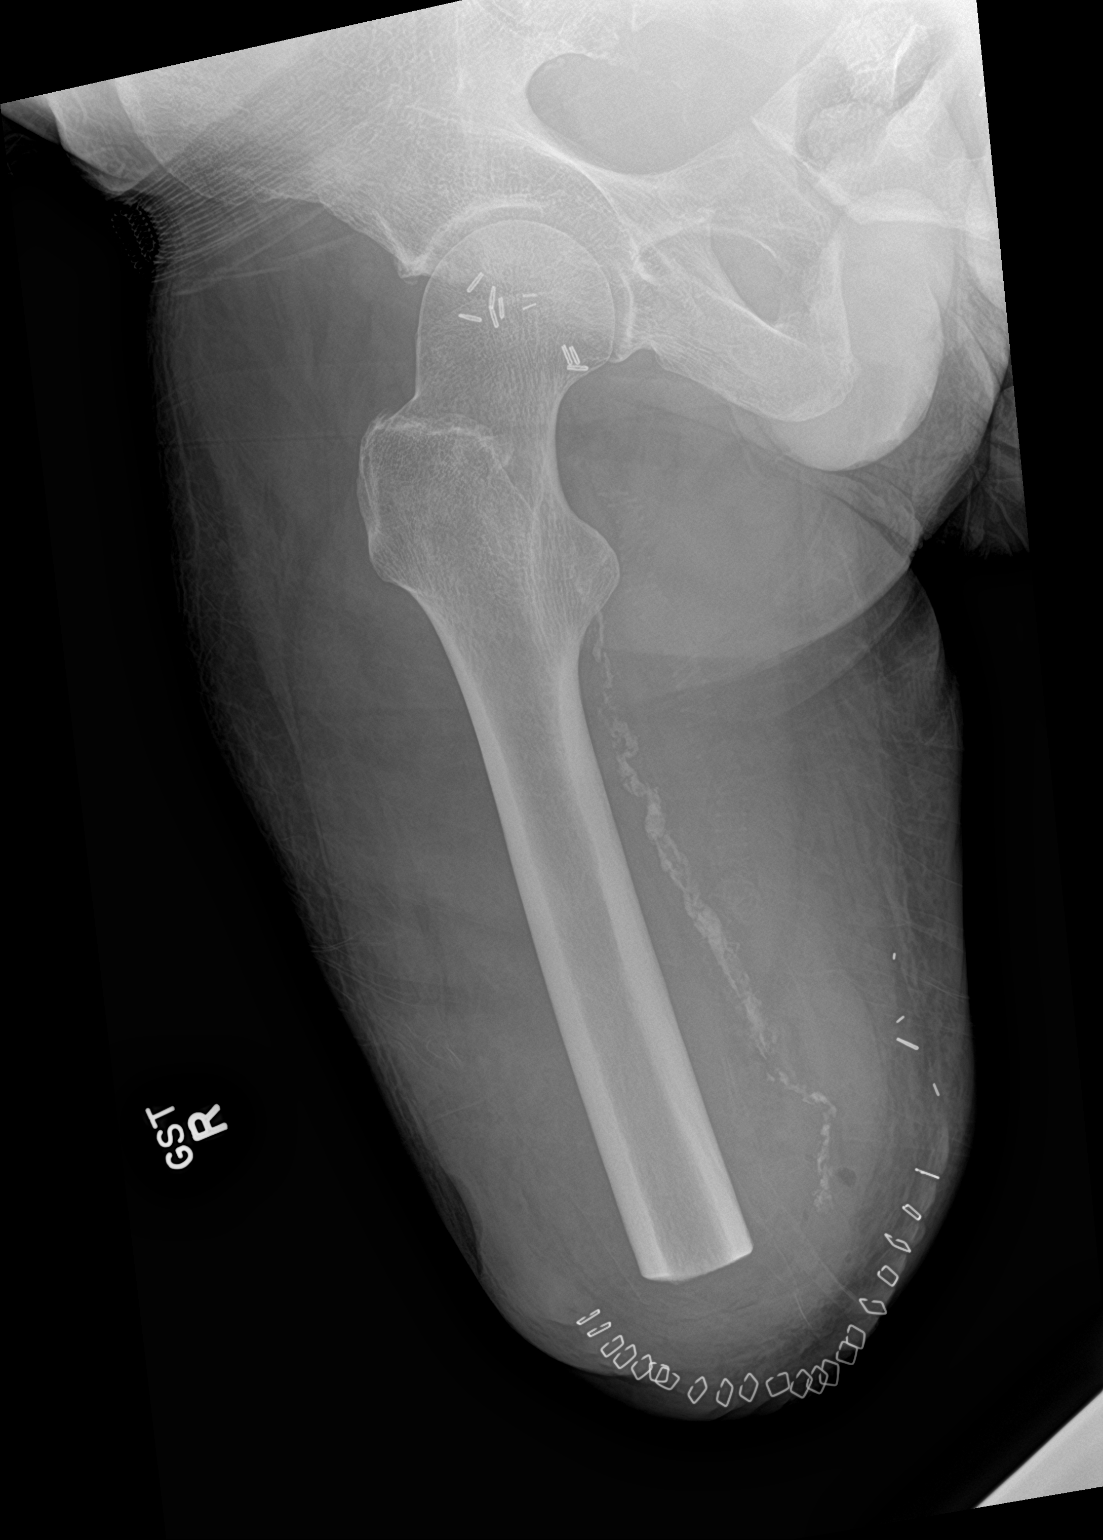

[2 of 2 positions shown; findings below may reference images not displayed]

FINDINGS: Postsurgical changes of above knee amputation of the right femur.
There is no acute fracture or dislocation. The bones are well
mineralized. No arthritic changes. No bony erosion or periosteal
reaction. There is atherosclerotic calcification of the vasculature
along the medial right thigh as well as multiple surgical clips over
the right hip. Cutaneous surgical clips noted over the stump. There
is diffuse subcutaneous edema.
IMPRESSION: Postsurgical changes as described.  No acute findings.
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
## 2020-06-21 IMAGING — DX DG CHEST 2V
2 series · 2 of 2 positions shown · non-contrast
Comparison: None

CLINICAL DATA: Cough productive of green mucus, shortness of
breath, and central chest pain since yesterday, LEFT femur surgery 3
weeks ago, history hypertension, diabetes mellitus, COPD, CHF,
asthma, pulmonary embolism, TB exposure

EXAM:
CHEST - 2 VIEW

[chest lat]
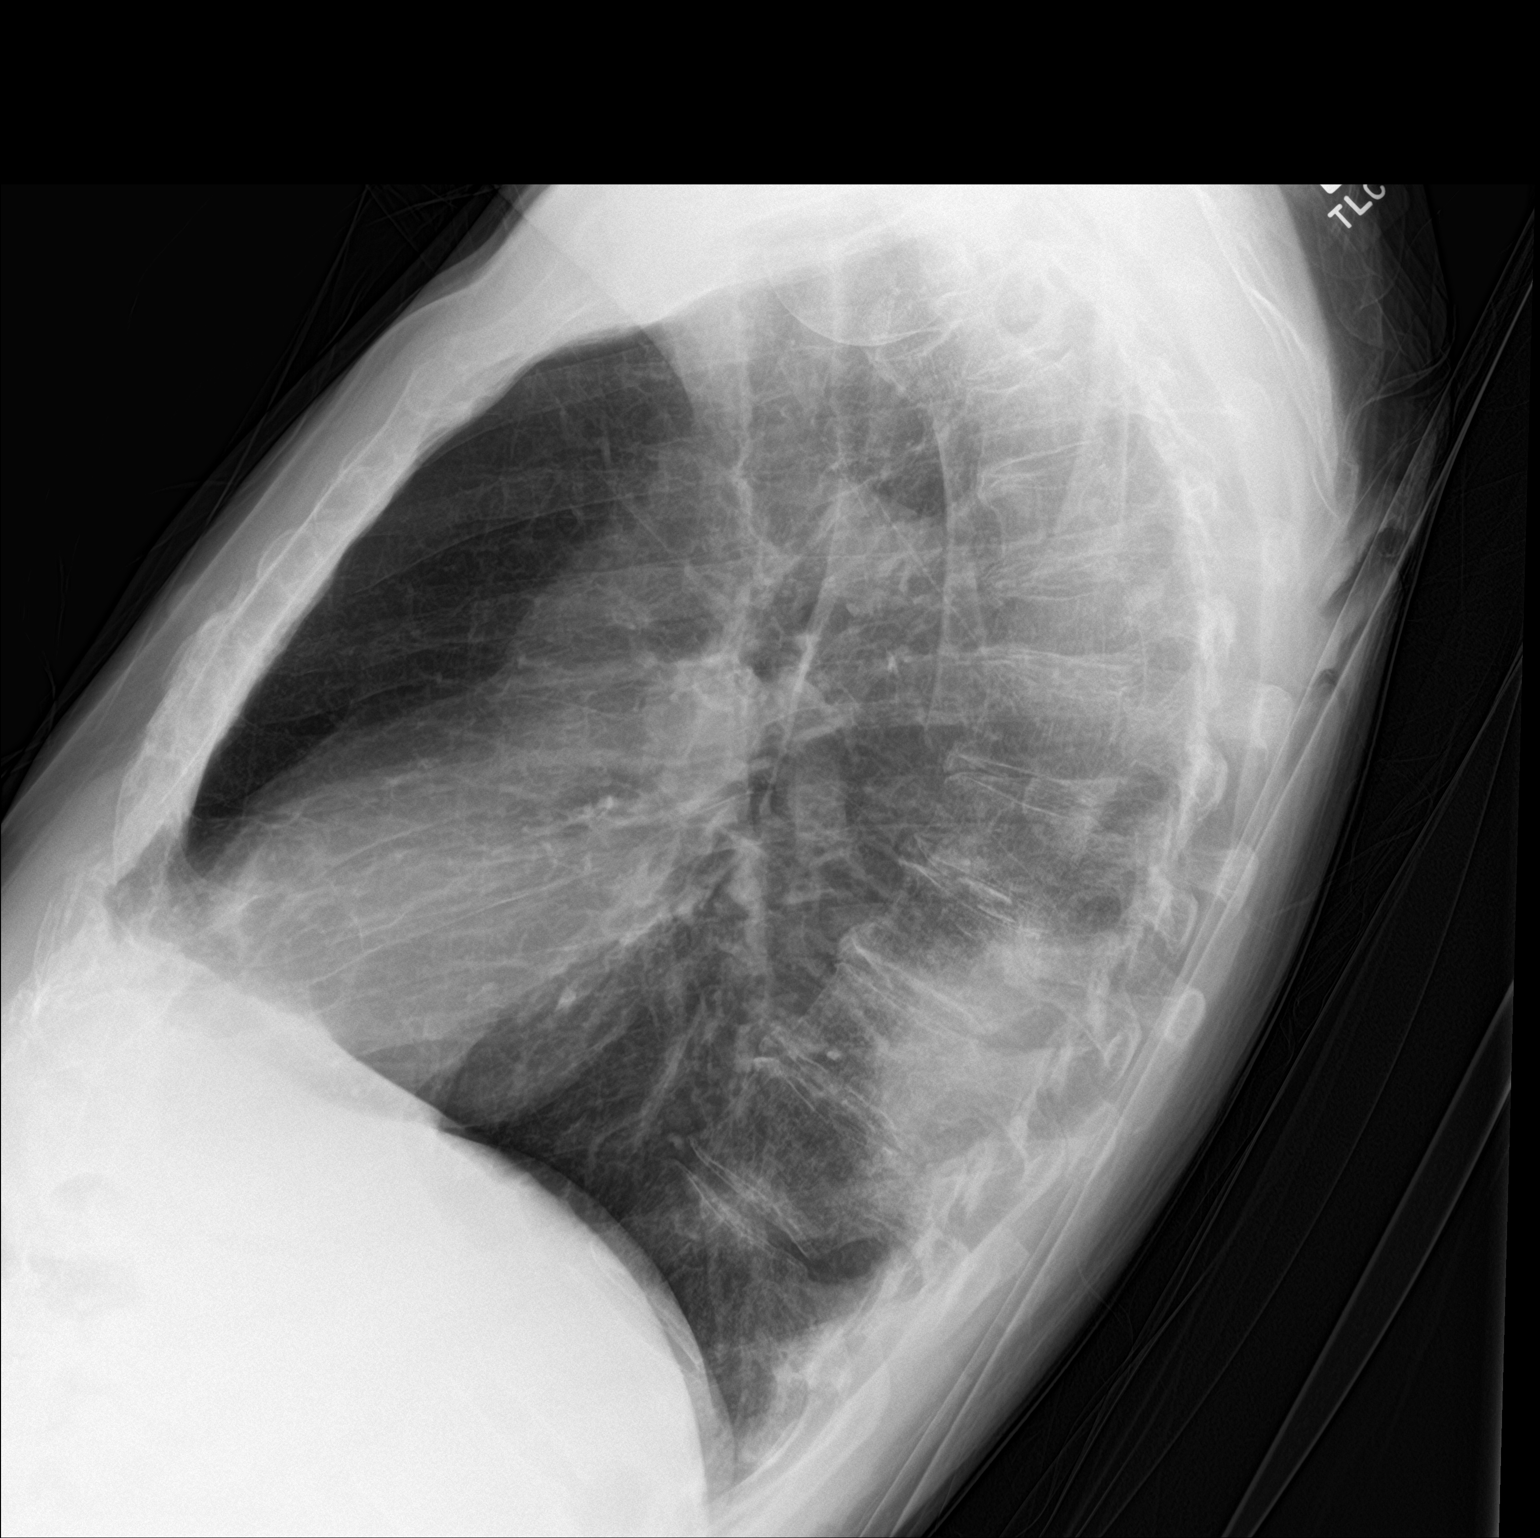

[chest ap]
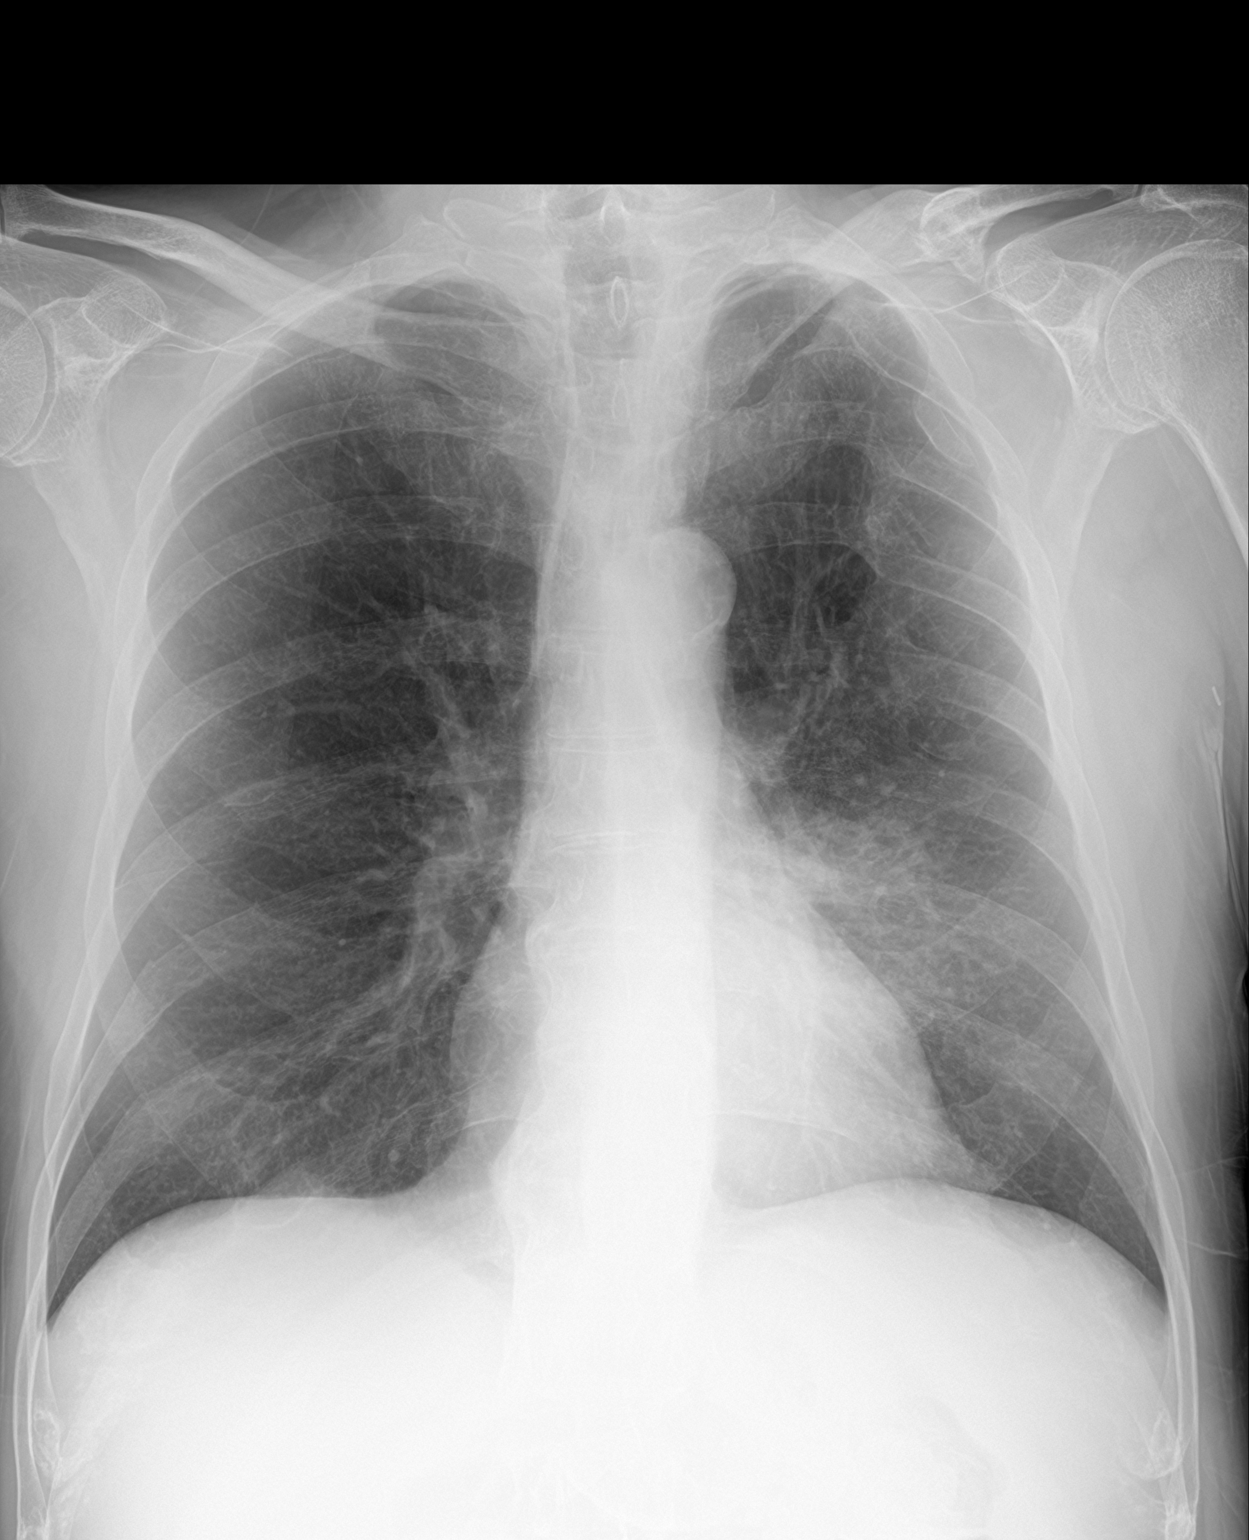

[2 of 2 positions shown; findings below may reference images not displayed]

FINDINGS: Normal heart size, mediastinal contours, and pulmonary vascularity.

Atherosclerotic calcification aorta.

Emphysematous changes with focal LEFT lower lobe consolidation
consistent with pneumonia.

Minimal subsegmental atelectasis at RIGHT base.

Remaining lungs clear.

No pleural effusion or pneumothorax.

Multiple old fractures of LEFT ribs and LEFT clavicle.

Osseous demineralization.
IMPRESSION: COPD changes with LEFT lower lobe pneumonia and mild RIGHT basilar
atelectasis.

## 2020-06-25 IMAGING — CR DG ABDOMEN ACUTE W/ 1V CHEST
3 series · 3 of 3 positions shown · non-contrast
Comparison: CT 01/21/2018, chest x-ray 01/21/2018

CLINICAL DATA: Cough nausea and vomiting

EXAM:
DG ABDOMEN ACUTE W/ 1V CHEST

[w abdomen decub]
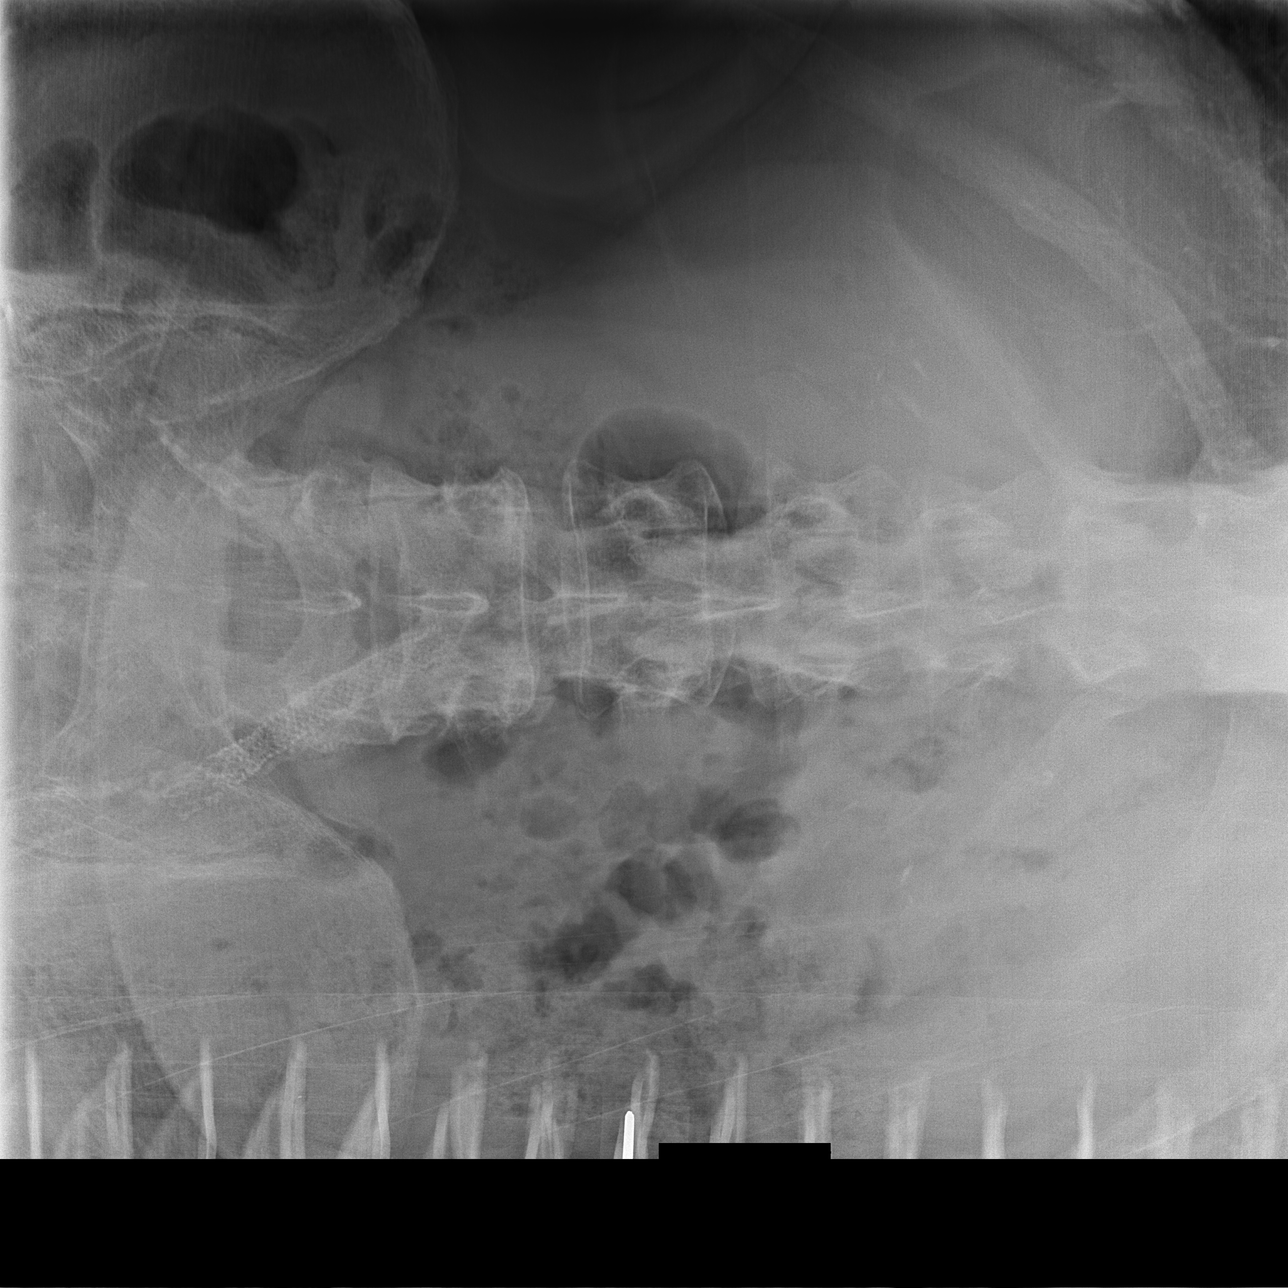

[x abdomen supine]
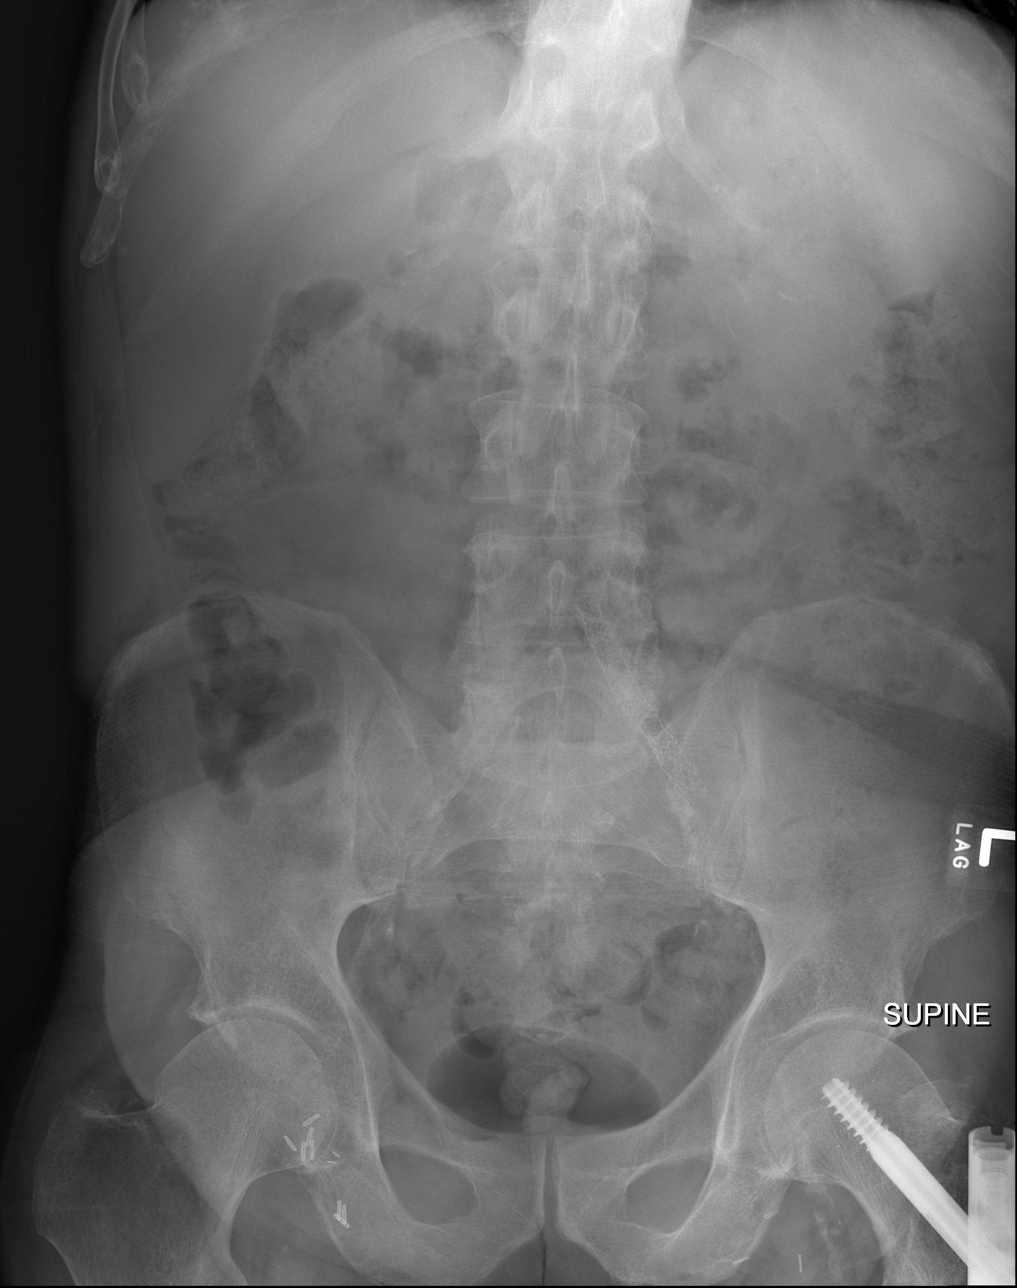

[x chest ap]
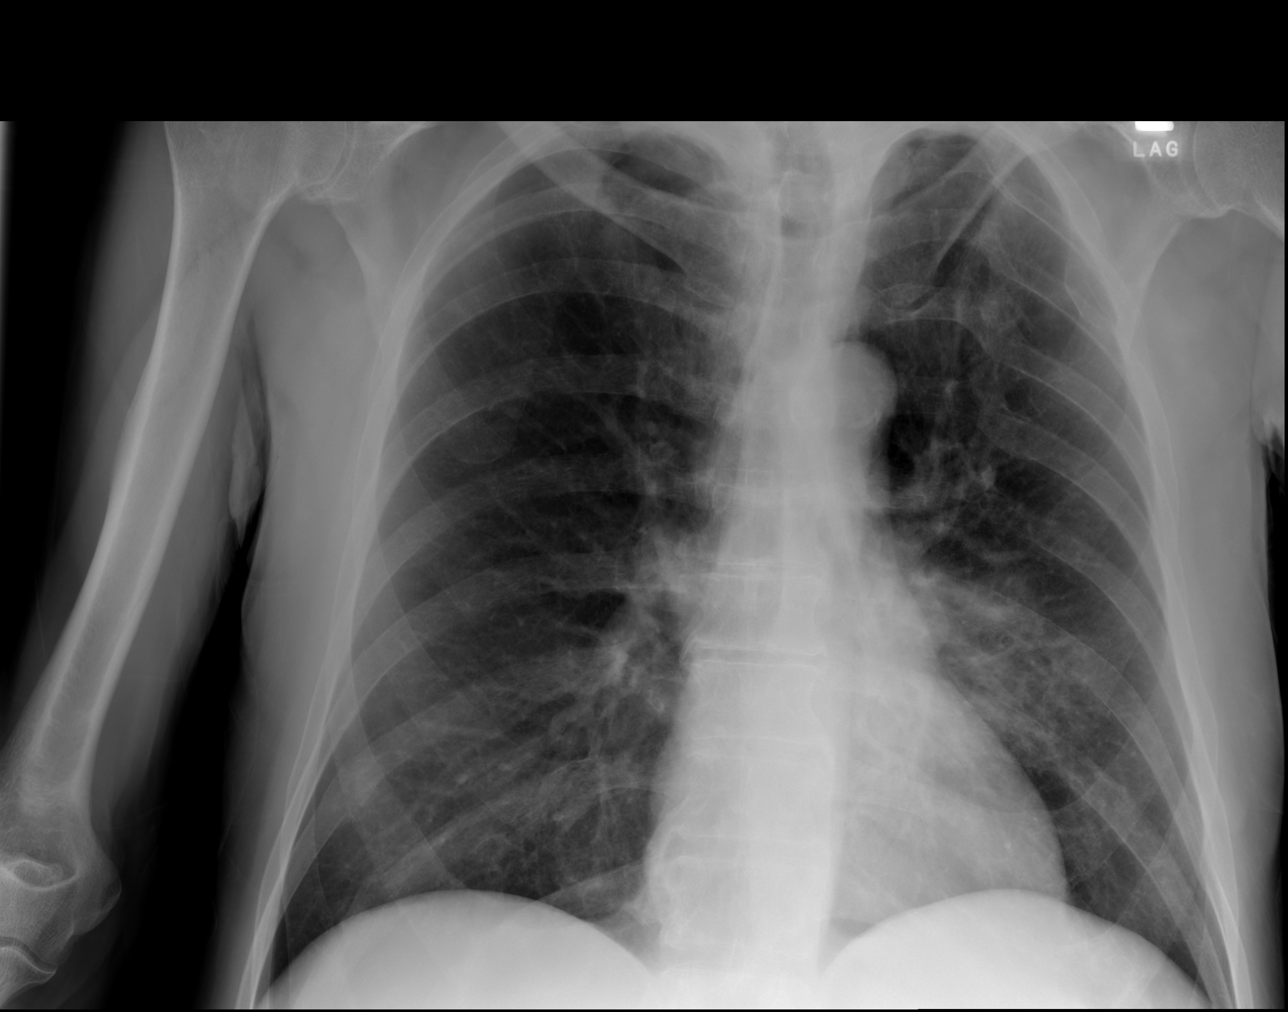

[3 of 3 positions shown; findings below may reference images not displayed]

FINDINGS: Single-view chest demonstrates left perihilar airspace disease.
Normal heart size. Old left upper rib deformities. Aortic
atherosclerosis.

Supine and decubitus views of the abdomen demonstrate no free air.
Nonobstructed bowel-gas pattern with moderate stool. Left iliac
stent. Clips in the right groin. Surgical hardware in the left
femur. Faint linear calcifications in the region of the renal hilus,
likely reflecting intrarenal vascular calcification.
IMPRESSION: 1. Left perihilar airspace disease concerning for a pneumonia
2. Nonobstructed bowel-gas pattern

## 2020-07-09 IMAGING — CT CT ABD-PELV W/ CM
2 of 5 series · 15 of 46 positions shown, 17 images · IV contrast (Omni 300)
Comparison: Chest radiograph 02/26/2018

CLINICAL DATA: Nausea, vomiting, worsening

EXAM:
CT ABDOMEN AND PELVIS WITH CONTRAST
TECHNIQUE: Multidetector CT imaging of the abdomen and pelvis was performed
using the standard protocol following bolus administration of
intravenous contrast.
CONTRAST:  100mL OMNIPAQUE IOHEXOL 300 MG/ML  SOLN

[Series 3: a/p w/ 5mm · axial · 0.83mm/px · z∈[+609,+1039]mm · 12 of 98 slices shown, 14 images]
[im 6/98  soft-tissue]
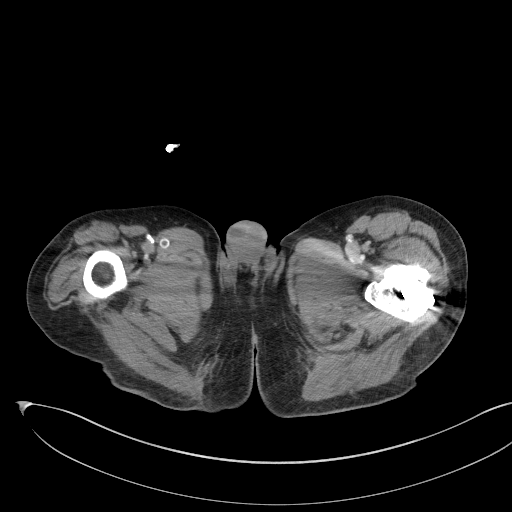
[im 6/98  bone]
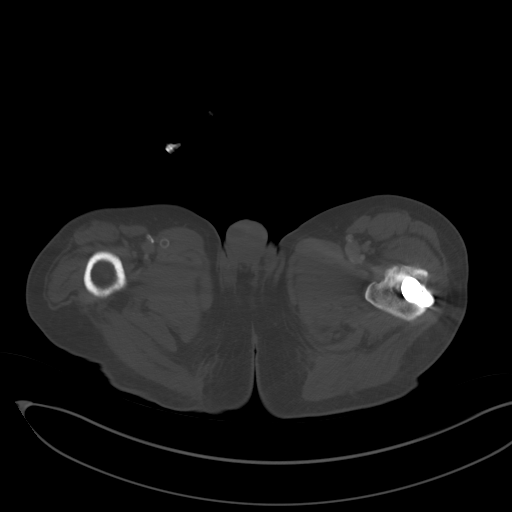
[im 16/98  soft-tissue]
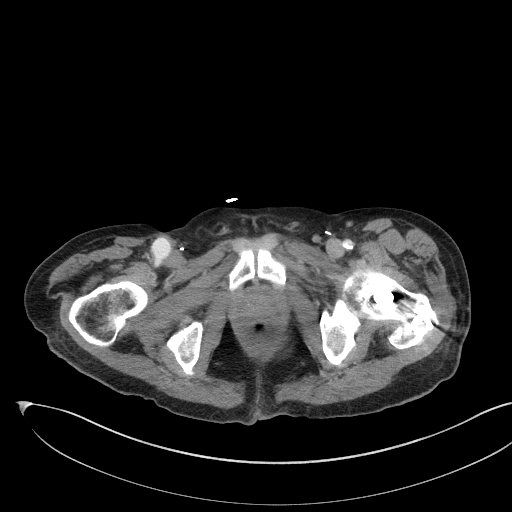
[im 21/98  soft-tissue]
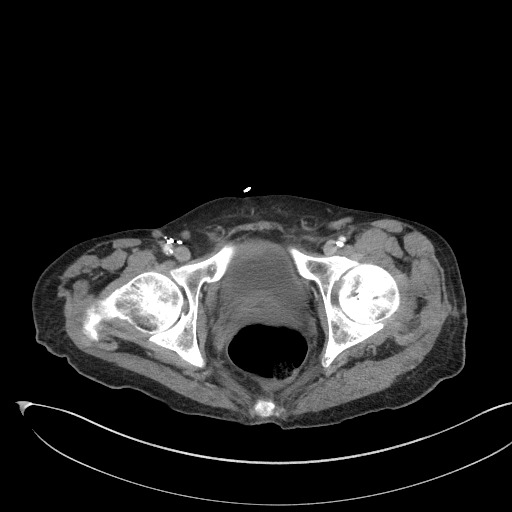
[im 31/98  soft-tissue]
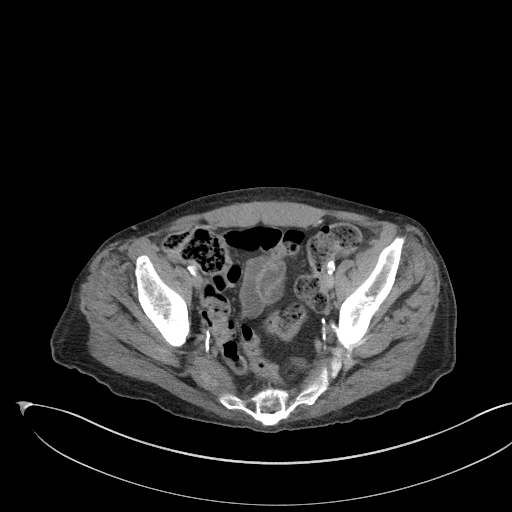
[im 36/98  soft-tissue]
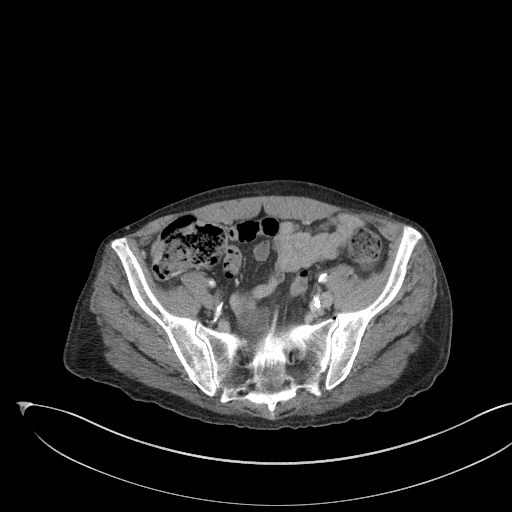
[im 46/98  soft-tissue]
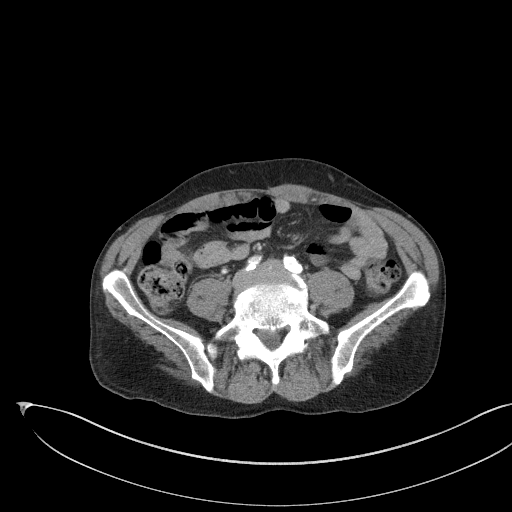
[im 52/98  soft-tissue]
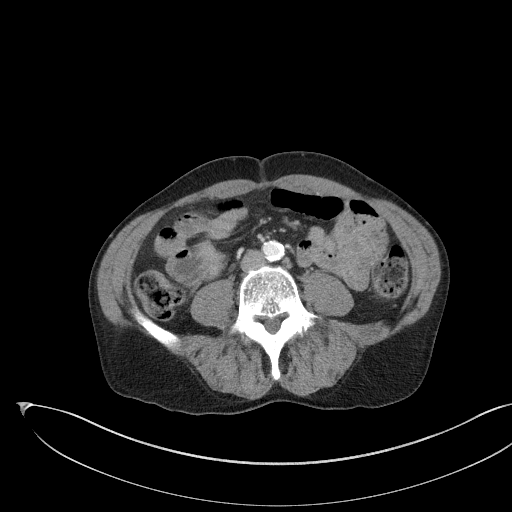
[im 62/98  soft-tissue]
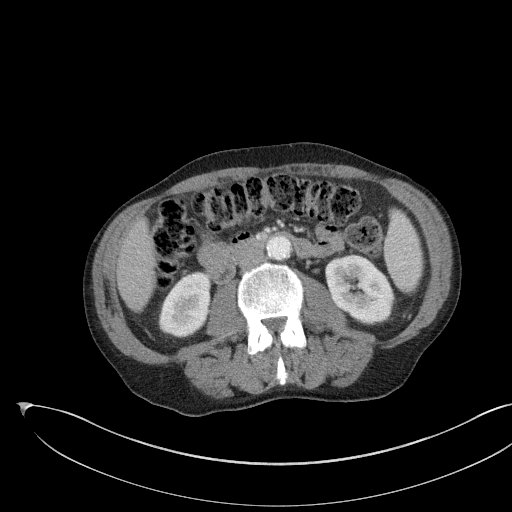
[im 67/98  soft-tissue]
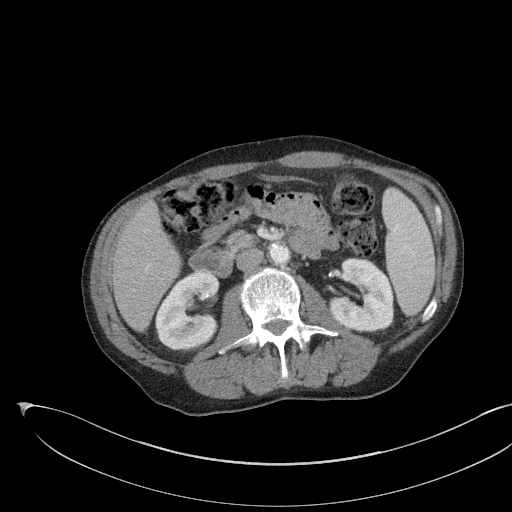
[im 67/98  bone]
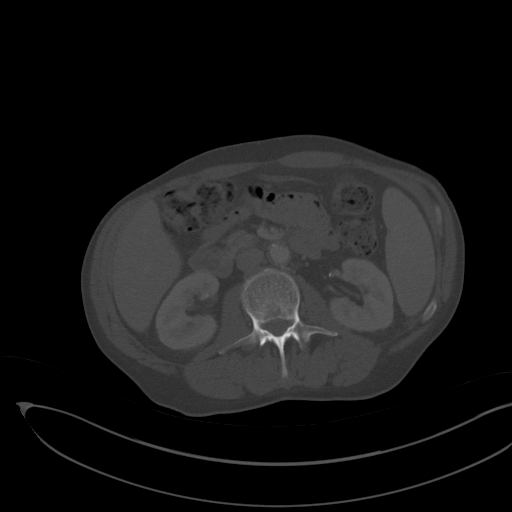
[im 77/98  soft-tissue]
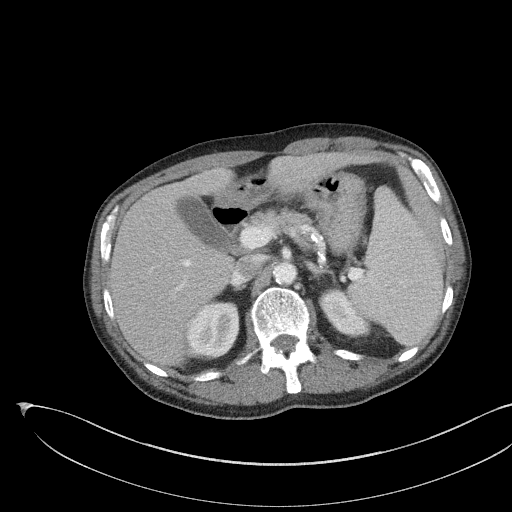
[im 82/98  soft-tissue]
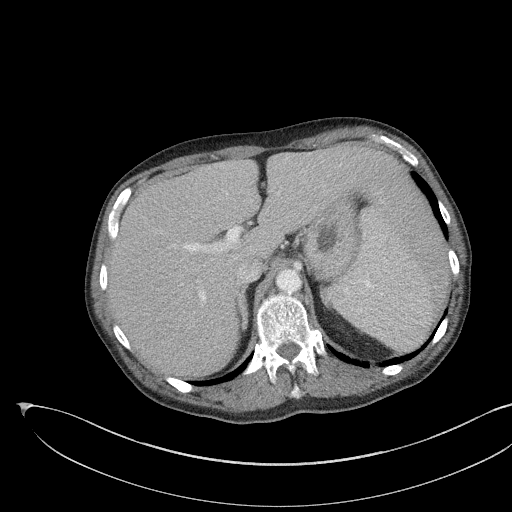
[im 92/98  soft-tissue]
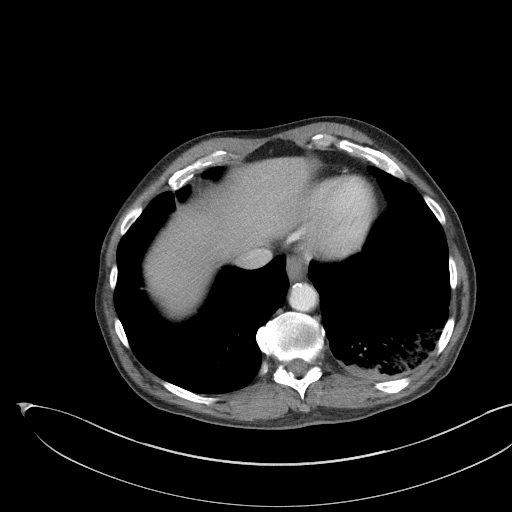

[Series 6: a/p w/ cor · coronal · 0.82mm/px · 3 of 138 slices shown]
[im 46/138  soft-tissue]
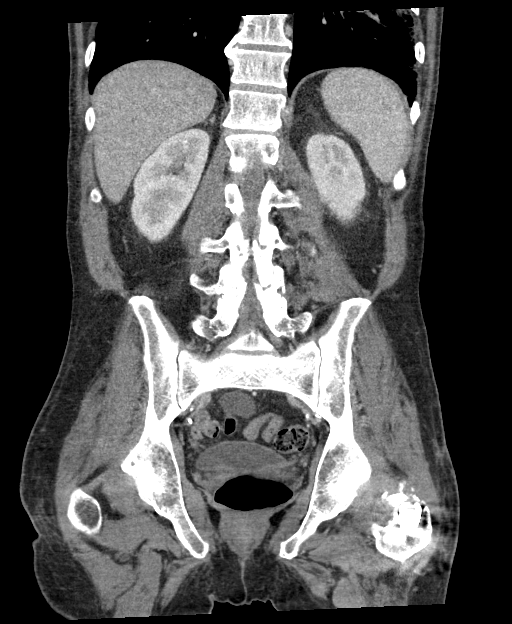
[im 61/138  soft-tissue]
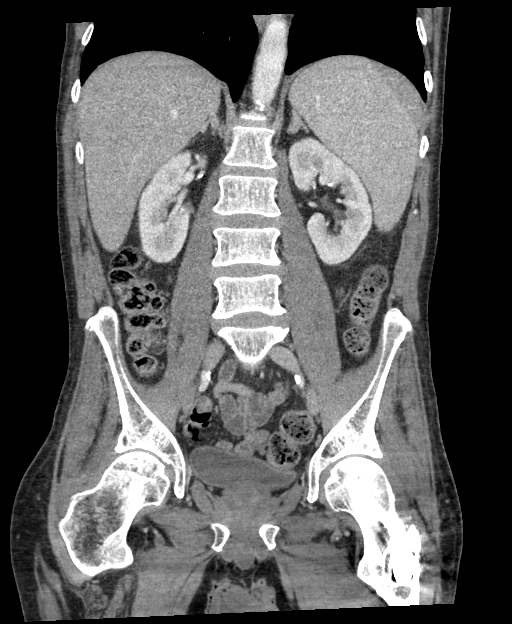
[im 77/138  soft-tissue]
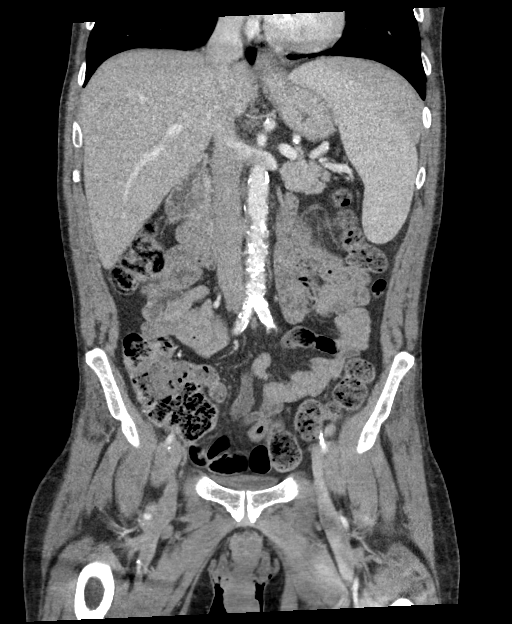

[15 of 46 positions shown; findings below may reference images not displayed]

FINDINGS: Lower chest: There is a partially included very dense, masslike
consolidation of the left lower lobe, in keeping with findings of
prior radiograph (series 3, image 1).

Hepatobiliary: No focal liver abnormality is seen. No gallstones,
gallbladder wall thickening, or biliary dilatation.

Pancreas: Unremarkable. No pancreatic ductal dilatation or
surrounding inflammatory changes.

Spleen: Splenomegaly, maximum span approximately 15.9 cm.

Adrenals/Urinary Tract: Adrenal glands are unremarkable. Kidneys are
normal, without renal calculi, focal lesion, or hydronephrosis.
Bladder is unremarkable.

Stomach/Bowel: Stomach is within normal limits. Appendix not clearly
visualized. No evidence of bowel wall thickening, distention, or
inflammatory changes. Large burden of stool in the colon.

Vascular/Lymphatic: Severe mixed calcific atherosclerosis of the
abdominal aorta and branch vessels, with a left common iliac artery
stent, which appears patent. There is evidence of prior bilateral
groin access and aneurysm or pseudoaneurysm of the right common
femoral artery measuring at least 2.1 cm (series 3, image 83, series
7, image 55). No enlarged abdominal or pelvic lymph nodes.

Reproductive: No mass or other abnormality.

Other: No abdominal wall hernia or abnormality. No abdominopelvic
ascites.

Musculoskeletal: No acute or significant osseous findings.
IMPRESSION: 1. There is a partially included very dense, masslike consolidation
of the left lower lobe, in keeping with findings of prior radiograph
(series 3, image 1). Findings are concerning for infection or
aspiration, particularly necrotizing infection given the appearance.
Underlying mass is not excluded and at minimum, follow-up CT or
radiographs are warranted at 6-8 weeks to document complete
resolution.

2.  Splenomegaly.

3. There is evidence of prior bilateral groin vascular access and
aneurysm or pseudoaneurysm of the right common femoral artery
measuring at least 2.1 cm (series 3, image 83, series 7, image 55).
Recommend vascular evaluation.

## 2020-07-09 IMAGING — CT CT CHEST W/ CM
2 of 4 series · 15 of 36 positions shown, 18 images · IV contrast (omnipaque)
Comparison: Chest radiographs dated 02/26/2018 and abdomen and
pelvis CT dated 03/16/2018.

CLINICAL DATA: Mass-like consolidation in the left lower lobe on
recent chest radiographs and abdomen and pelvis CT. The patient has
had worsening nausea and vomiting and at the time of the chest
radiographs had a productive cough, shortness of breath and central
chest pain. History of COPD, asthma, pulmonary embolism and TB
exposure. The patient is a current smoker, smoking 2 packs of
cigarettes per day with a 104 pack-year history of smoking.

EXAM:
CT CHEST WITH CONTRAST
TECHNIQUE: Multidetector CT imaging of the chest was performed during
intravenous contrast administration.
CONTRAST:  75mL OMNIPAQUE IOHEXOL 300 MG/ML  SOLN

[Series 4: chest w · axial · 0.75mm/px · z∈[+1083,+1359]mm · 12 of 164 slices shown, 15 images]
[im 13/164  mediastinal]
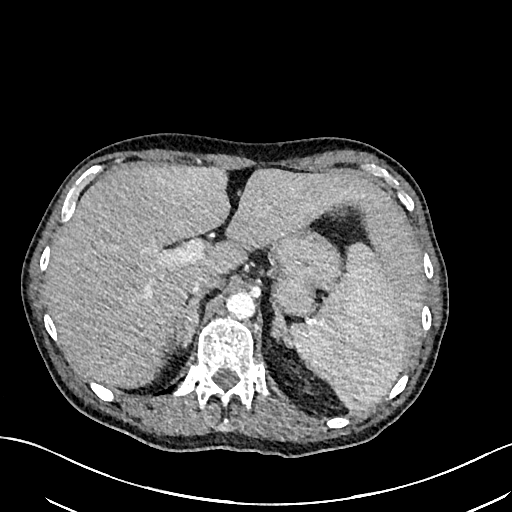
[im 13/164  lung]
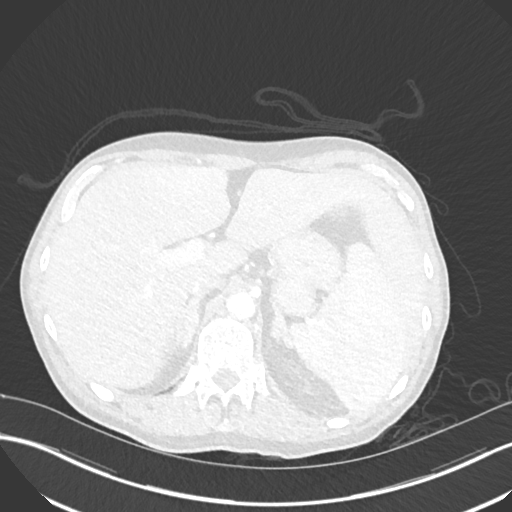
[im 26/164  lung]
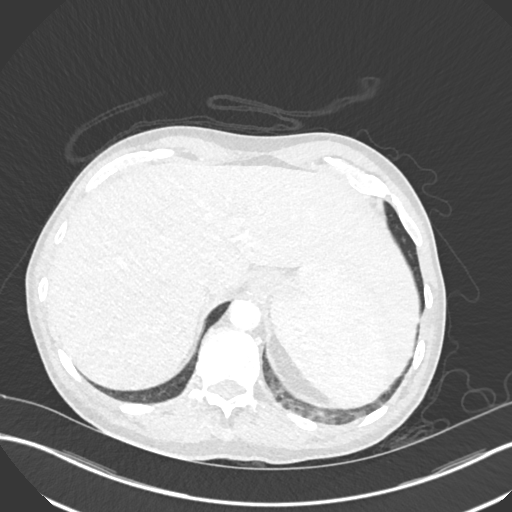
[im 38/164  lung]
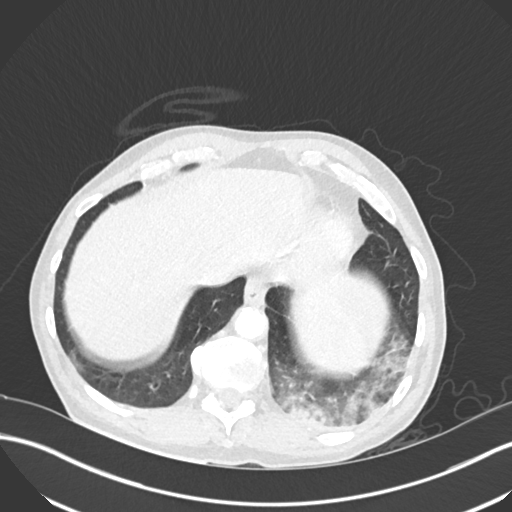
[im 51/164  lung]
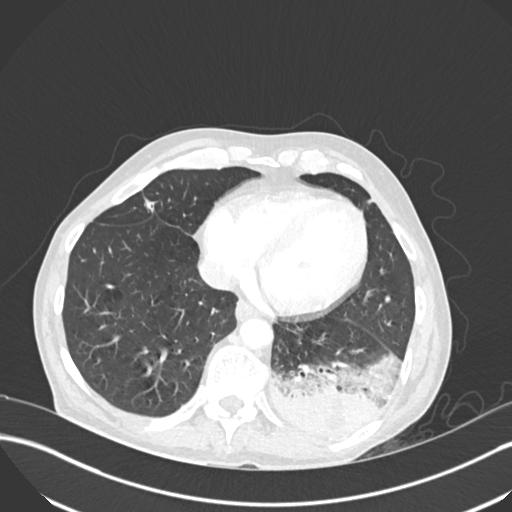
[im 63/164  mediastinal]
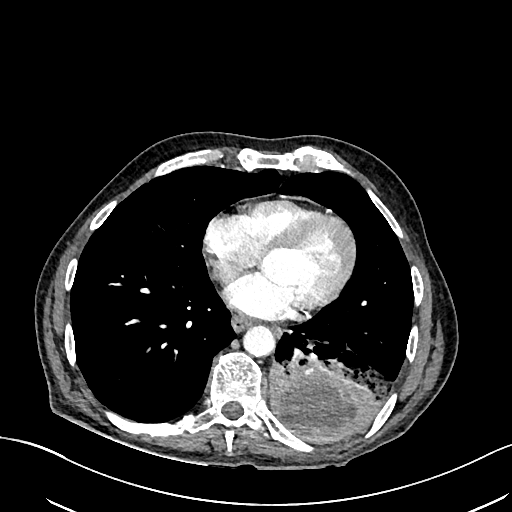
[im 63/164  lung]
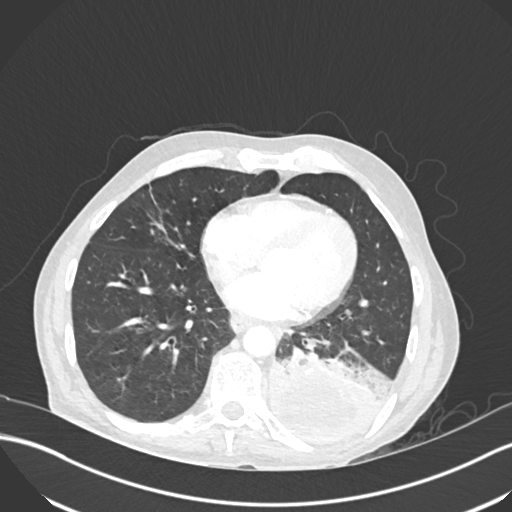
[im 76/164  lung]
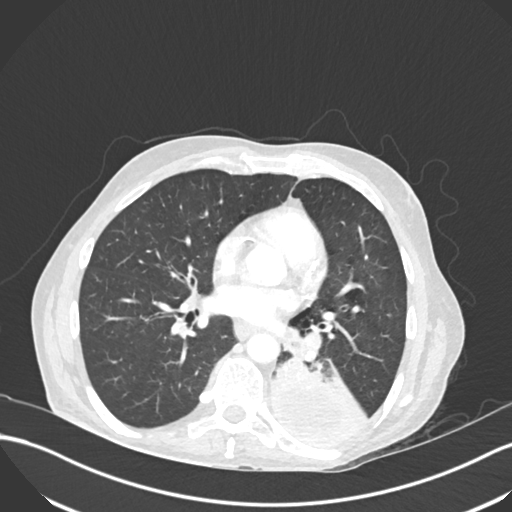
[im 88/164  lung]
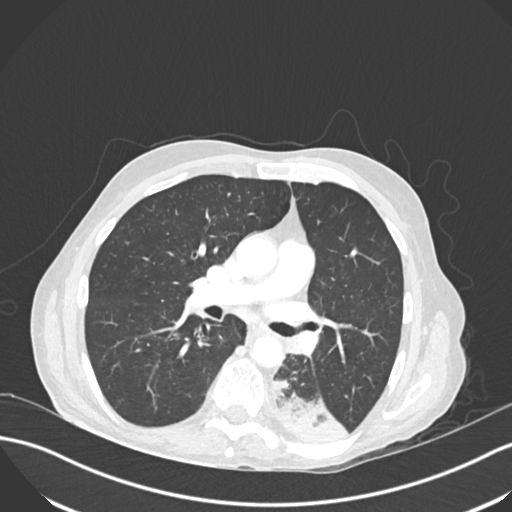
[im 101/164  lung]
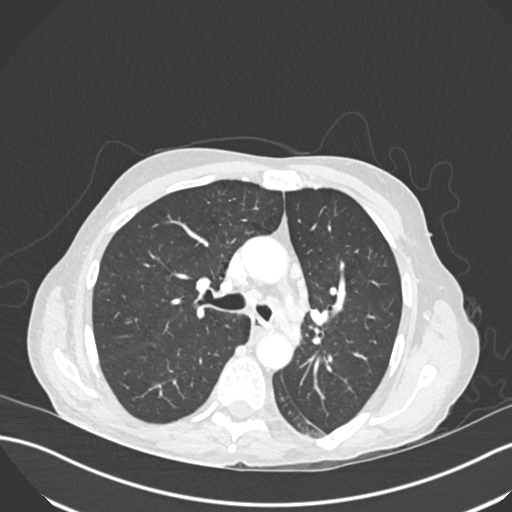
[im 113/164  mediastinal]
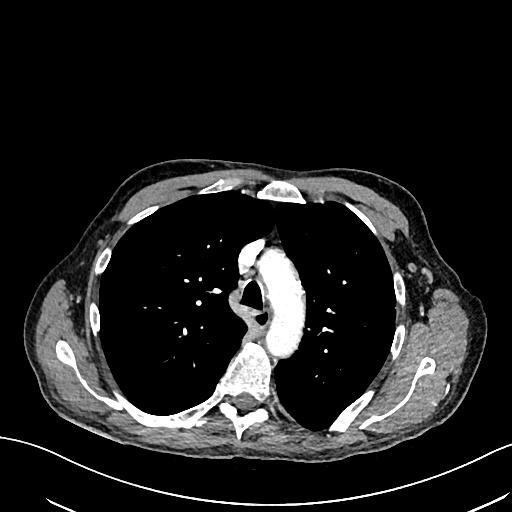
[im 113/164  lung]
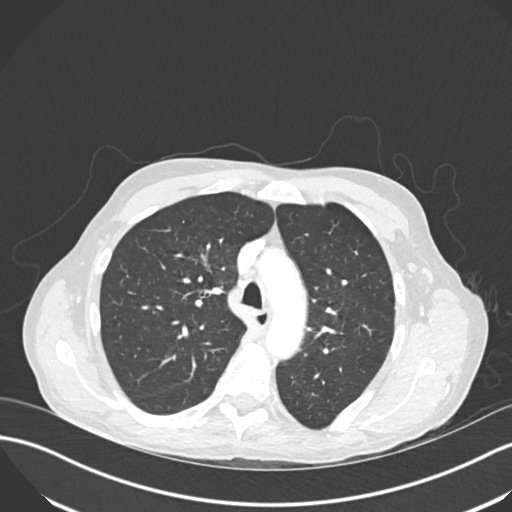
[im 126/164  lung]
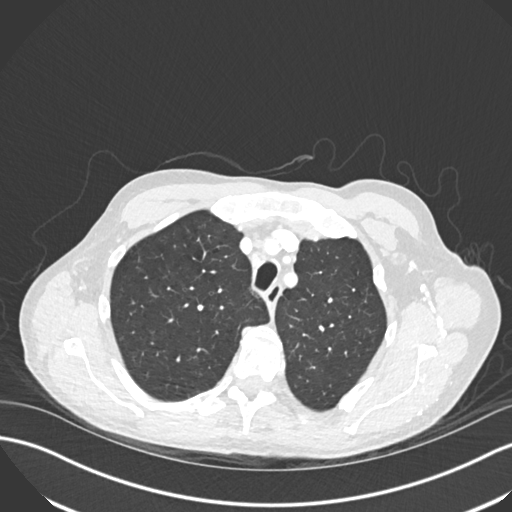
[im 138/164  lung]
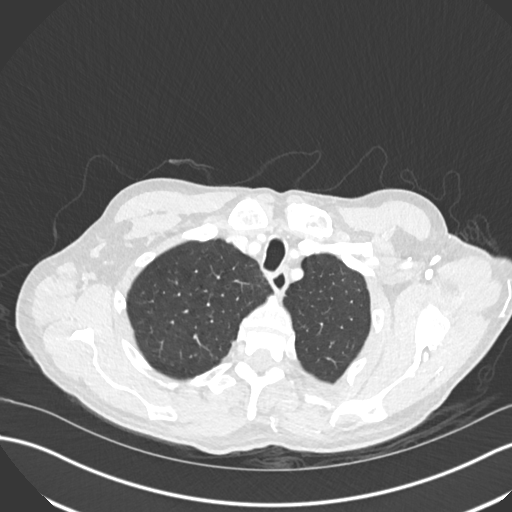
[im 151/164  lung]
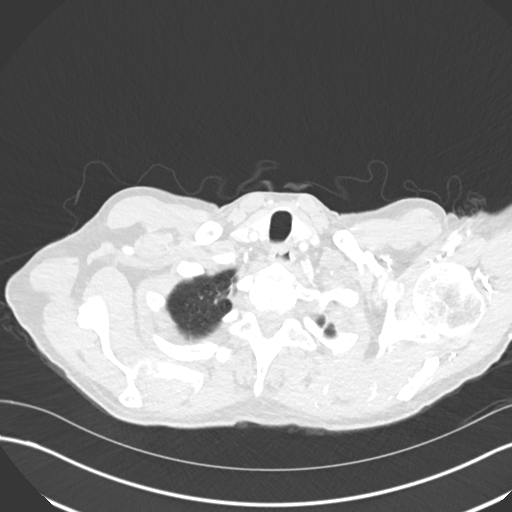

[Series 7: cor · coronal · 0.68mm/px · 3 of 143 slices shown]
[im 29/143  lung]
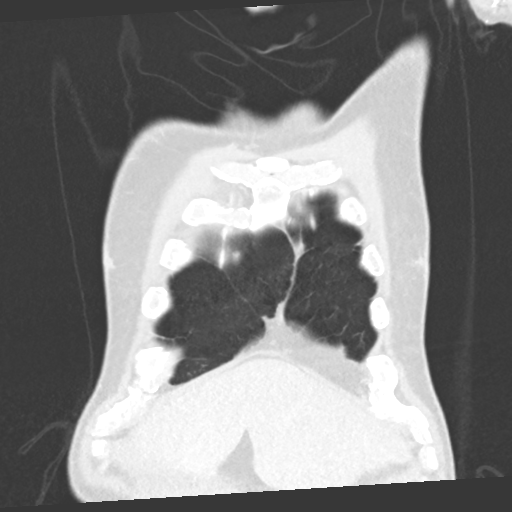
[im 57/143  lung]
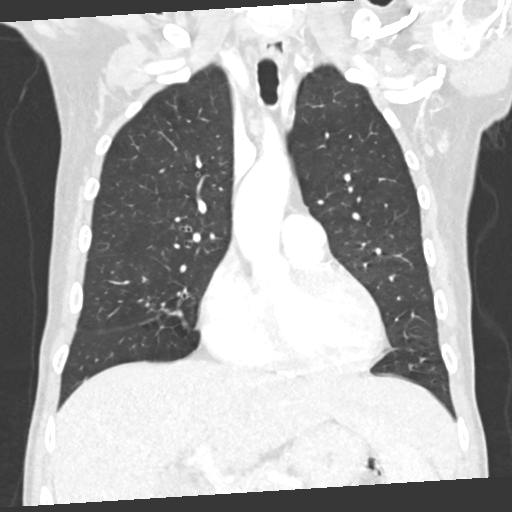
[im 86/143  lung]
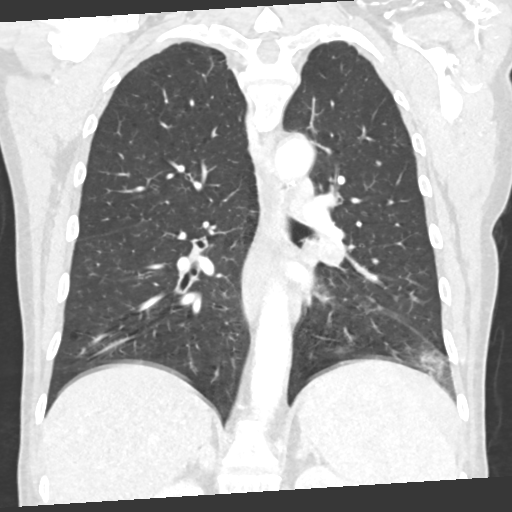

[15 of 36 positions shown; findings below may reference images not displayed]

FINDINGS: Cardiovascular: Atheromatous calcifications, including the coronary
arteries and aorta. Normal sized heart.

Mediastinum/Nodes: Multiple enlarged mediastinal and bilateral hilar
lymph nodes, greatest in the left hilar region. These include a 12
mm short axis AP window node on image number 62 series 4, 10 mm
short axis right anterior paratracheal node on image number 43
series 4, 14 mm short axis subcarinal node on image number 68 series
4, 12 mm short axis right hilar node on image number 70 series 4 and
a 17 mm short axis left hilar node on image number 86 series 4.
Unremarkable thyroid gland and esophagus.

Lungs/Pleura: Patchy and dense, mass-like airspace consolidation in
the posterior aspect of the left lower lobe with a prominent oval
central low density component measuring 6.1 x 4.2 cm on image number
99 series 4. There is also mucous plugging in multiple left lower
lobe bronchi. This is relatively low density with small amount of
interspersed air. Mild diffuse peribronchial thickening and mild
centrilobular bullous changes. Small amount of bilateral linear
atelectasis or scarring. No pleural fluid.

Upper Abdomen: Atheromatous arterial calcifications without
aneurysm. Mild diffuse low density of the liver relative to the
spleen.

Musculoskeletal: Old, healed bilateral rib fractures. Thoracic and
lower cervical spine degenerative changes.
IMPRESSION: 1. 6.1 x 4.2 cm mass-like area of consolidation in the posterior
aspect of the left lower lobe with a central low density component.
This has an appearance most compatible with necrotizing pneumonia
and possible lung abscess. An underlying cavitary neoplasm is less
likely but not excluded.
2. Mucous plugging involving multiple left lower lobe bronchi.
3. Mild mediastinal and bilateral hilar adenopathy, most likely
reactive. Metastatic adenopathy is also a possibility.
4.  Calcific coronary artery and aortic atherosclerosis.
5. Mild changes of COPD and chronic bronchitis.
6. Mild diffuse hepatic steatosis.

Aortic Atherosclerosis (DHY7I-RS3.3) and Emphysema ([id number].G).

## 2020-07-29 IMAGING — DX PORTABLE CHEST - 1 VIEW
1 series · 1 of 1 positions shown · non-contrast
Comparison: Chest radiograph March 02, 2018 and chest CT March 16, 2018

CLINICAL DATA: Cough and shortness of breath

EXAM:
PORTABLE CHEST 1 VIEW

[chest]
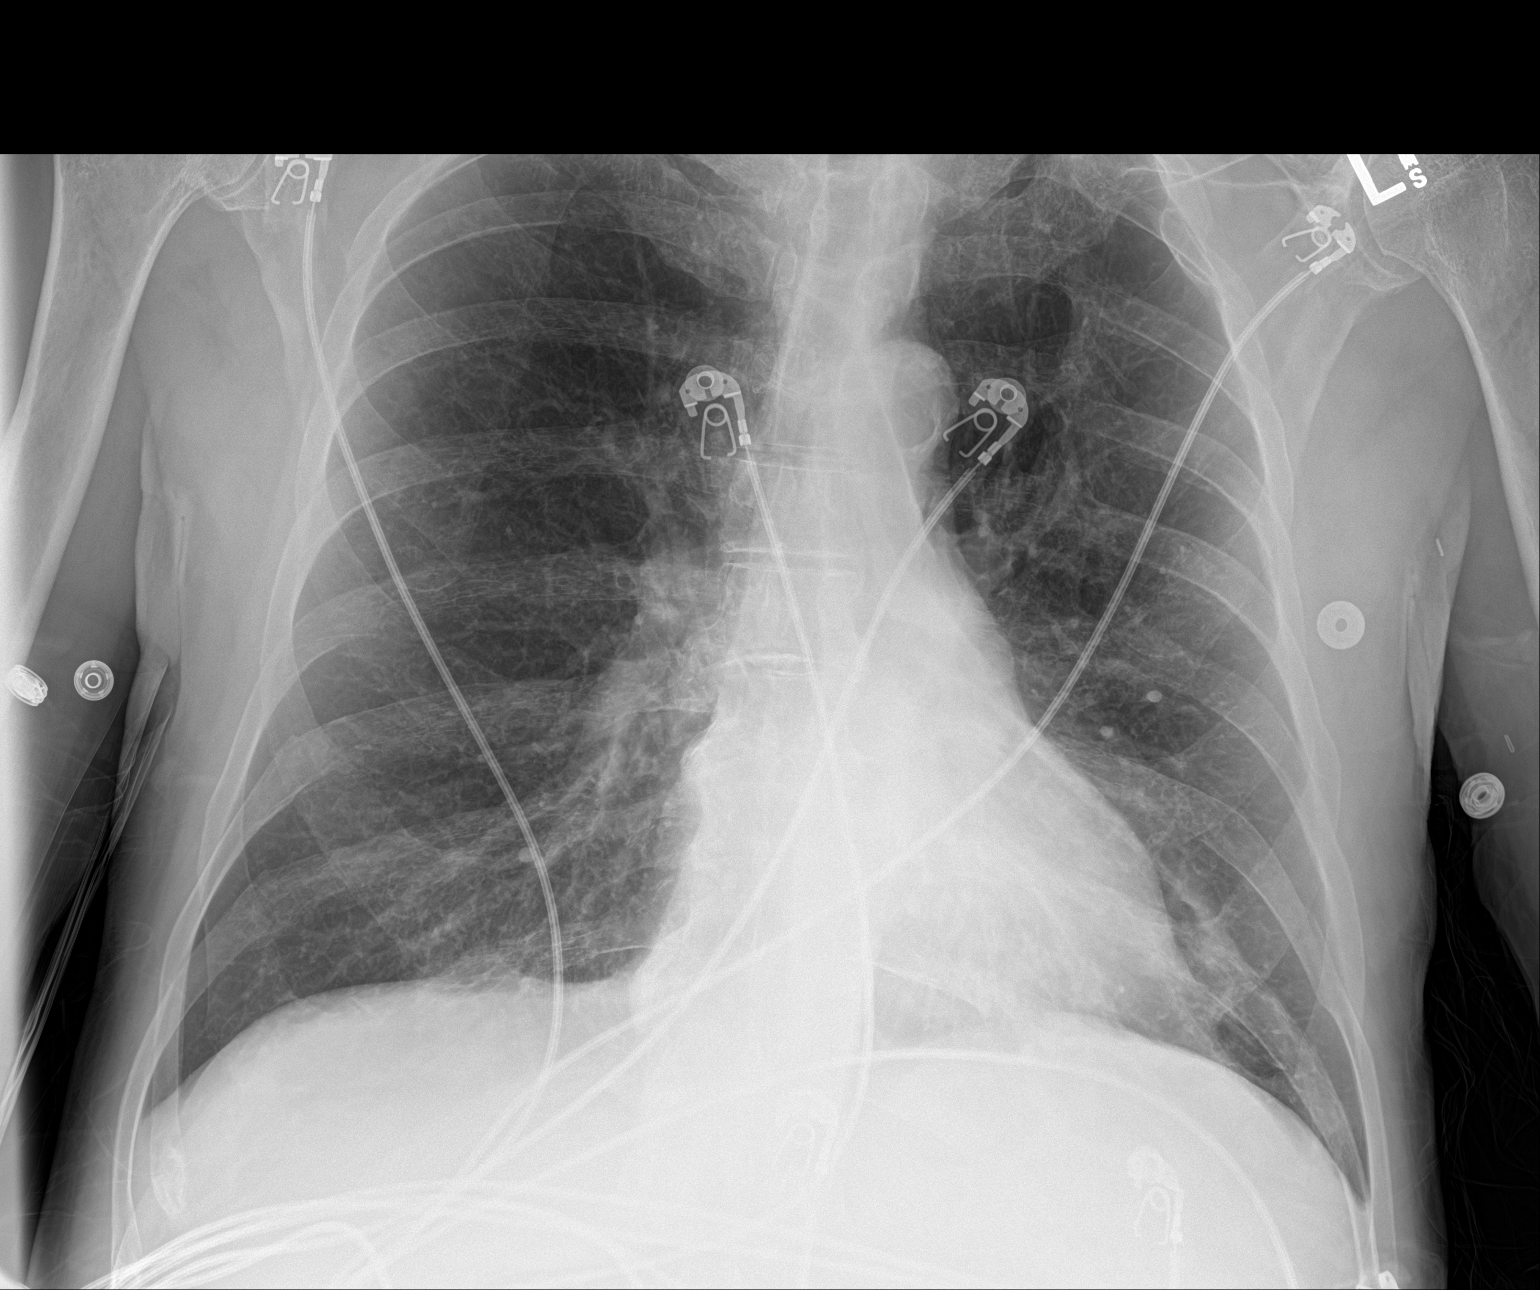

[1 of 1 positions shown; findings below may reference images not displayed]

FINDINGS: There is atelectatic change in the left base. There has been
interval clearing of consolidation from a portion of the left base.
The lungs elsewhere are clear. Heart size and pulmonary vascularity
are normal. No adenopathy. There is aortic atherosclerosis. Bones
are osteoporotic. There is evidence of old rib trauma on the left
with remodeling.
IMPRESSION: Left base atelectasis. Interval clearing of consolidation from left
base. No consolidation currently evident by radiography.

Stable cardiac silhouette. No adenopathy evident. Aortic
Atherosclerosis ([id number].V).

## 2020-08-11 IMAGING — DX PORTABLE CHEST - 1 VIEW
2 series · 2 of 2 positions shown · non-contrast
Comparison: Most recent radiograph 04/05/2018. Most recent CT
03/16/2018

CLINICAL DATA: Shortness of breath. Cough.

EXAM:
PORTABLE CHEST 1 VIEW

[chest ap (1 of 2)]
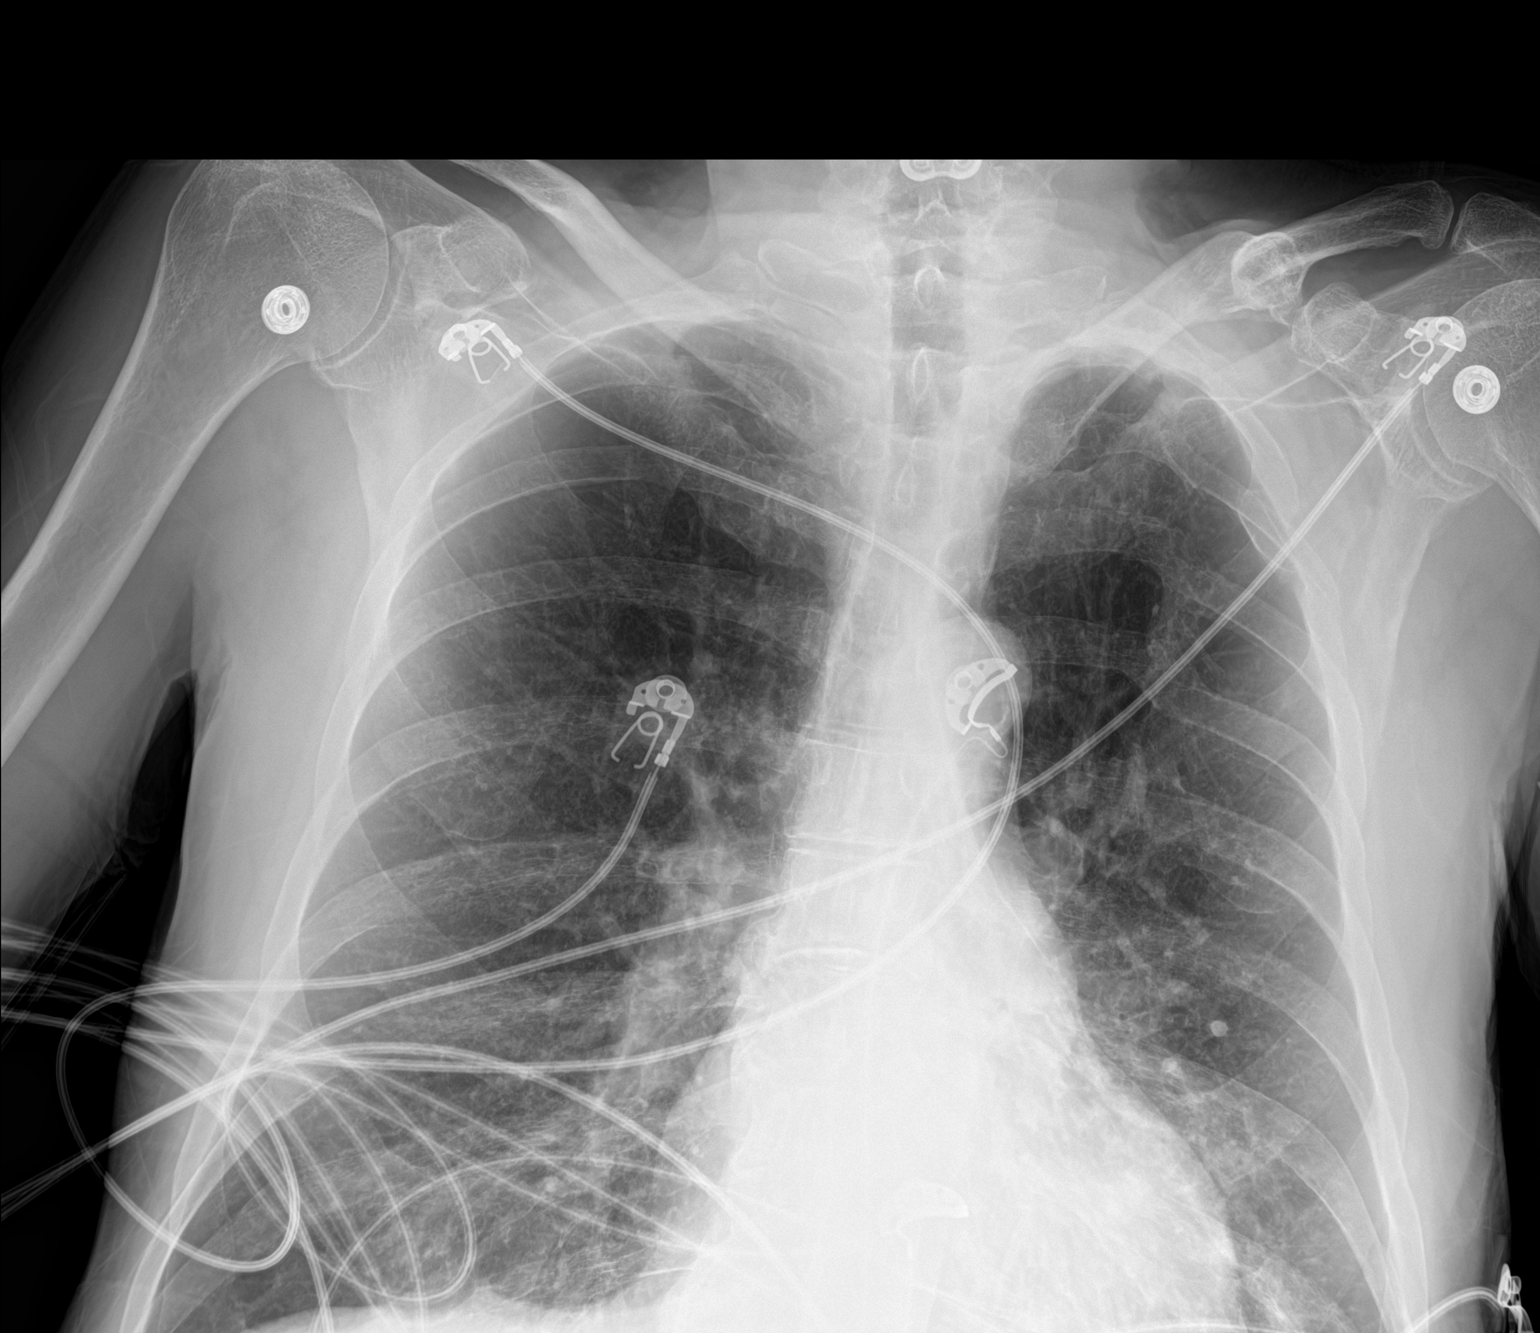

[chest ap (2 of 2)]
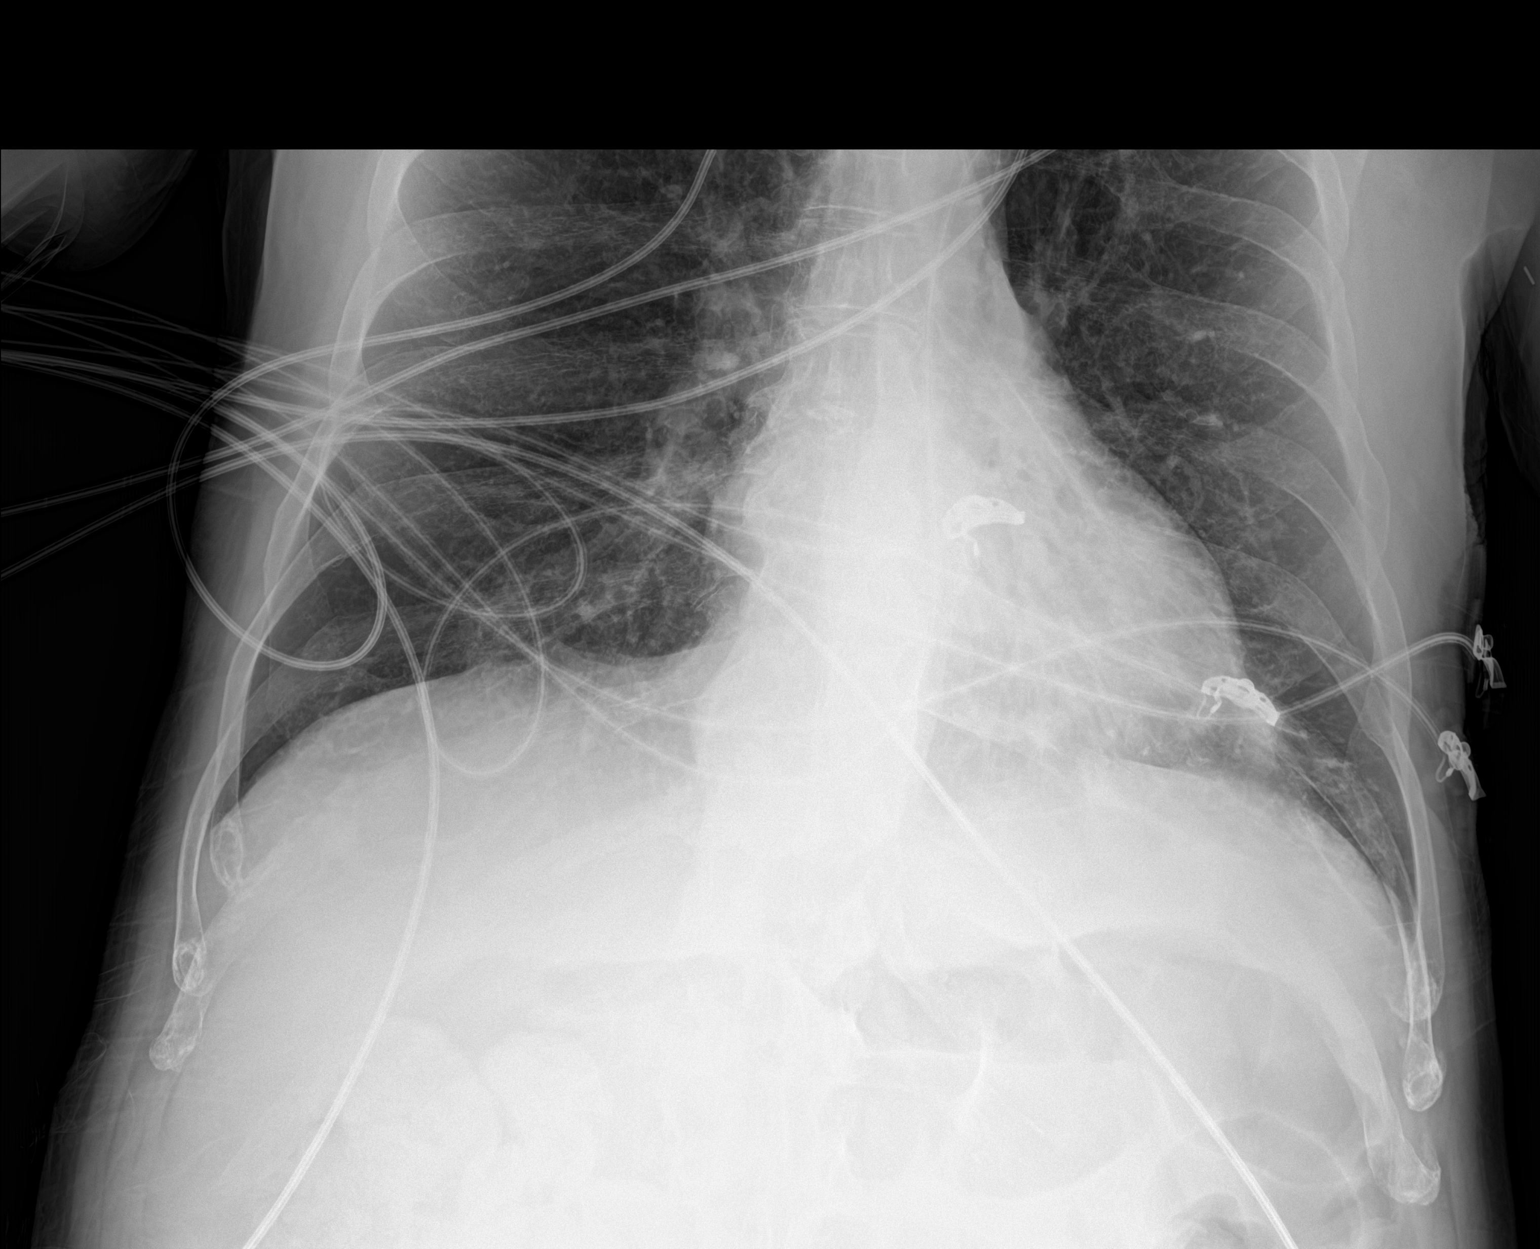

[2 of 2 positions shown; findings below may reference images not displayed]

FINDINGS: Chronic hyperinflation. Unchanged left lung base
atelectasis/scarring. No new focal airspace disease. Unchanged heart
size and mediastinal contours. No pulmonary edema, pleural effusion
or pneumothorax. Remote left rib fractures. Remote left clavicle
fracture. Surgical hardware in the cervical spine partially
included.
IMPRESSION: Chronic hyperinflation and left basilar atelectasis/scarring. No new
abnormality.

## 2020-08-15 IMAGING — DX PORTABLE CHEST - 1 VIEW
1 series · 2 of 2 positions shown · non-contrast
Comparison: CT 04/20/2018

CLINICAL DATA: Shortness of Breath

EXAM:
PORTABLE CHEST 1 VIEW

[Series 1: chest ap · 0.14mm/px · 2 of 2 slices shown]
[im 1/2]
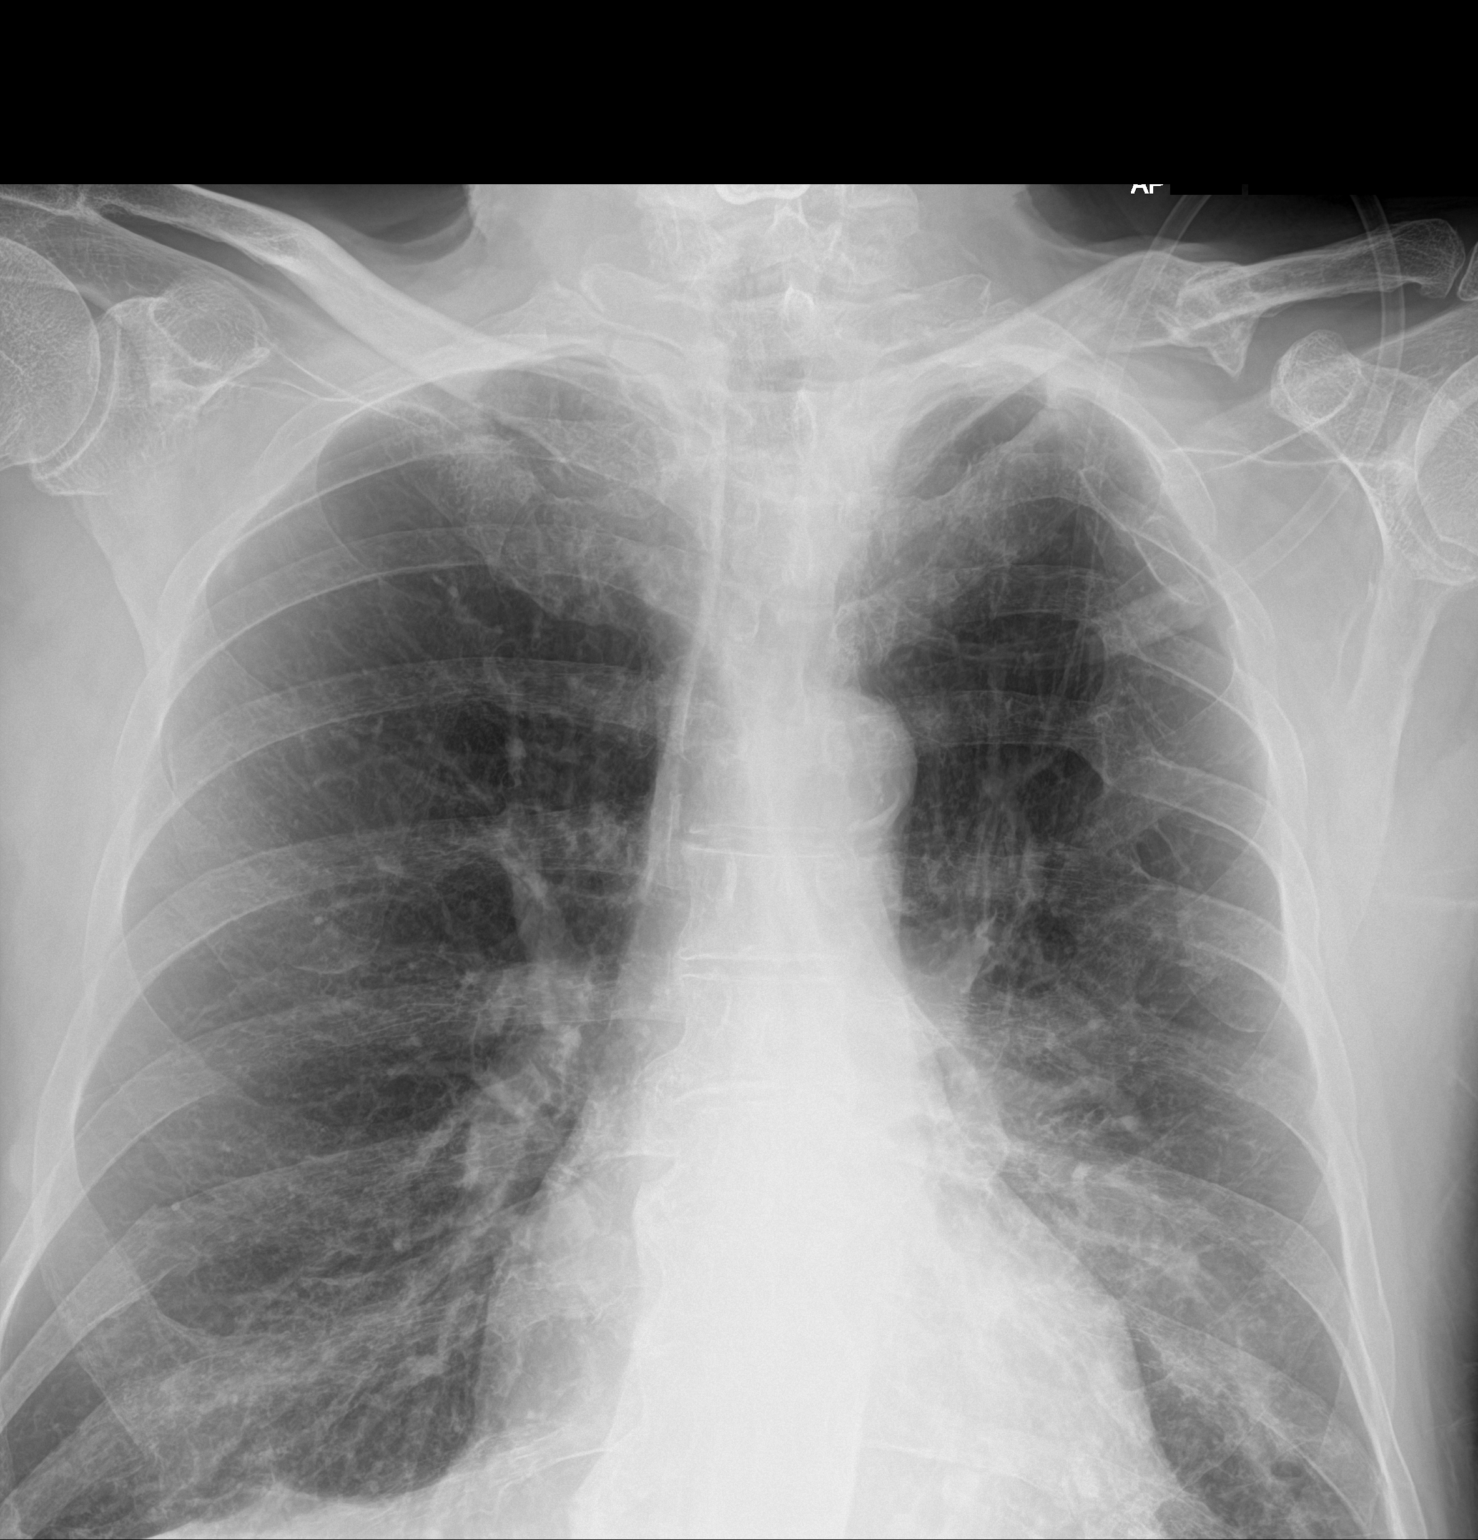
[im 2/2]
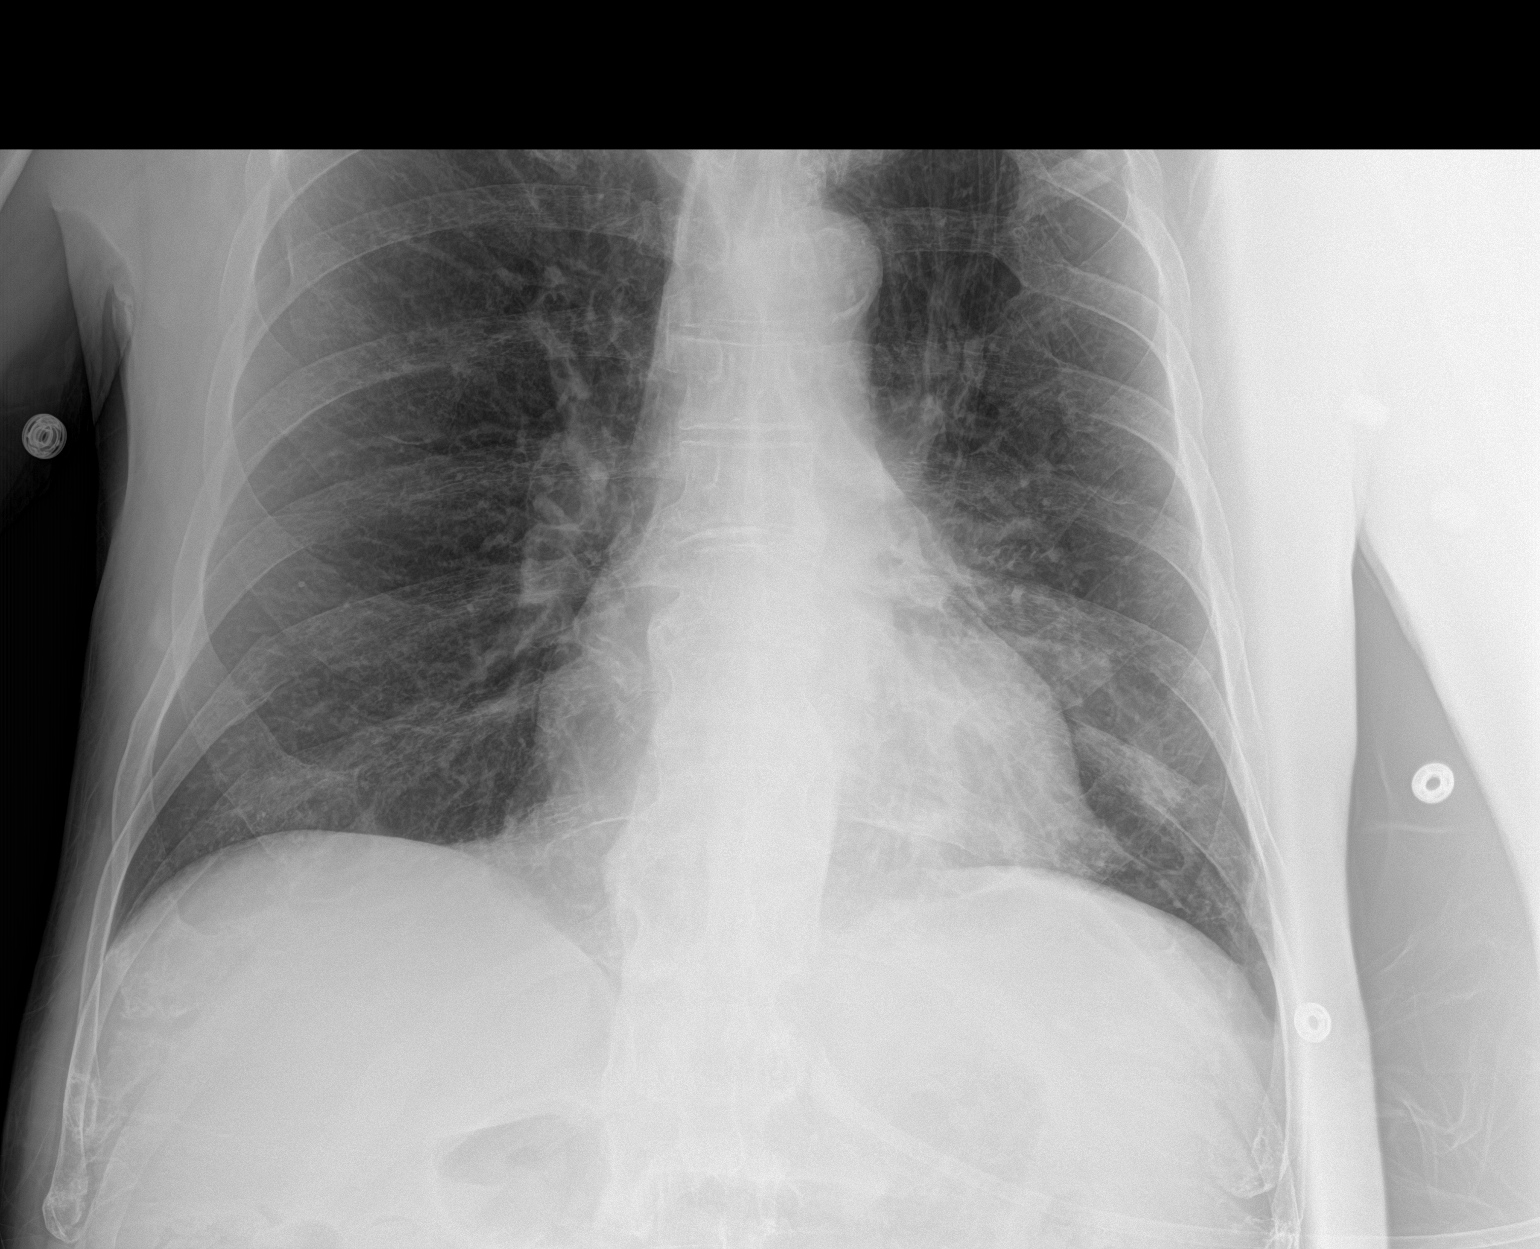

[2 of 2 positions shown; findings below may reference images not displayed]

FINDINGS: There is hyperinflation of the lungs compatible with COPD. Airspace
opacity in the left lower lobe corresponding to the pneumonia seen
on prior chest CT. No confluent opacity on the right. No effusions
or acute bony abnormality. Old healed left rib fractures.
IMPRESSION: Left lower lobe pneumonia as seen on recent CT.

## 2020-08-17 IMAGING — DX PORTABLE CHEST - 1 VIEW
2 series · 2 of 2 positions shown · non-contrast
Comparison: 04/22/2018 and earlier, including CT chest 04/20/2018,
03/16/2018 and earlier.

CLINICAL DATA: Follow-up cavitary LEFT LOWER LOBE pneumonia.
Persistent cough, shortness breath and chest congestion.

EXAM:
PORTABLE CHEST 1 VIEW

[chest ap (1 of 2)]
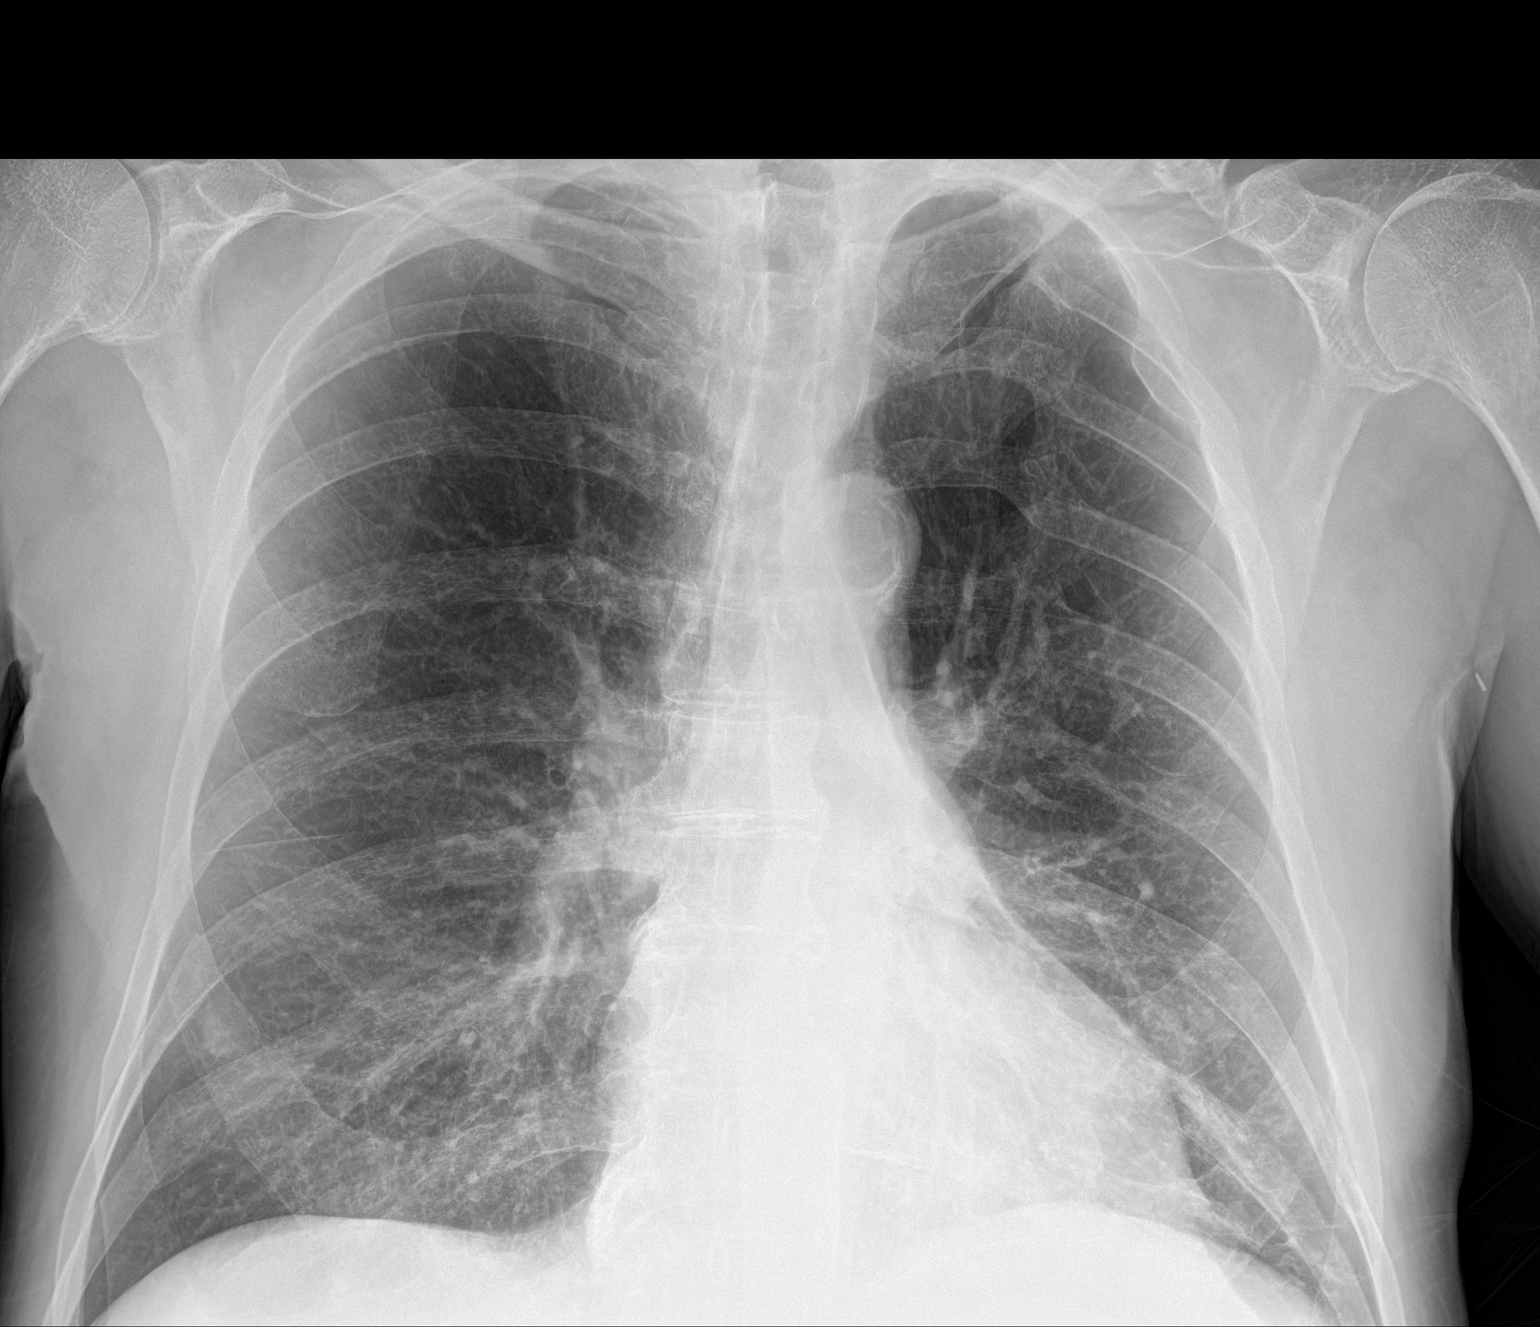

[chest ap (2 of 2)]
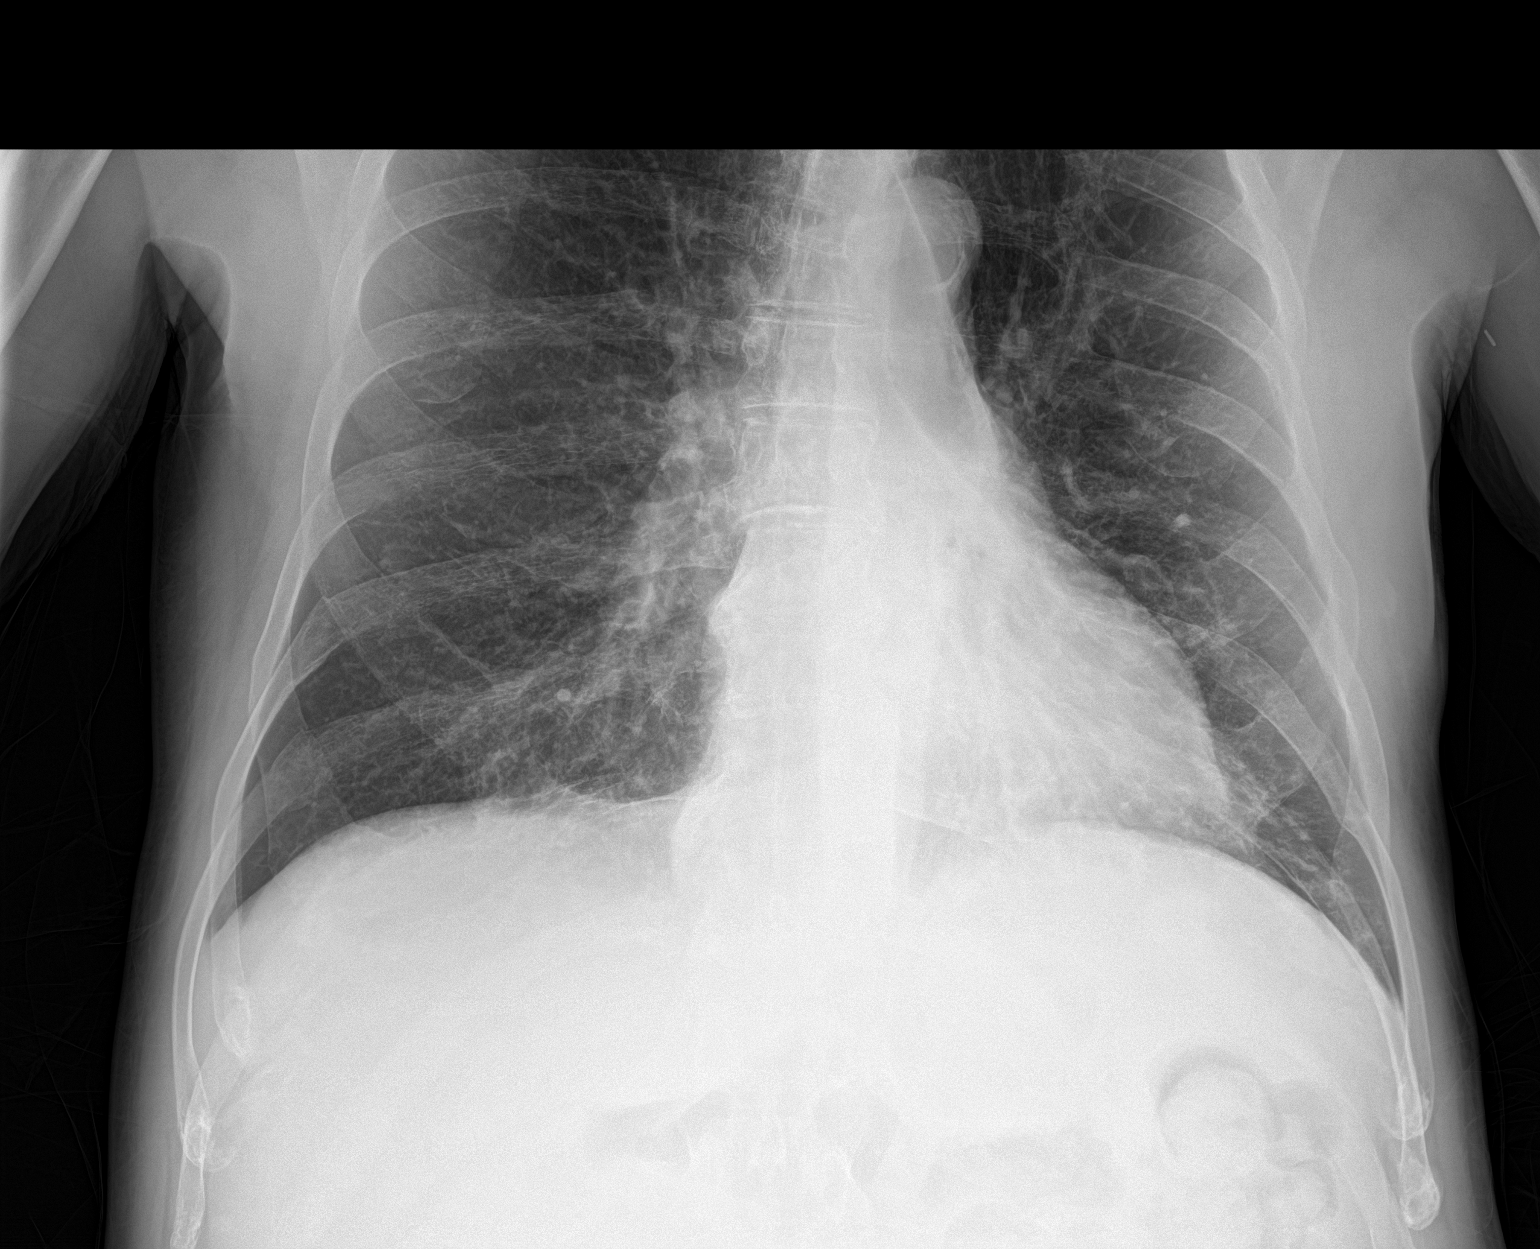

[2 of 2 positions shown; findings below may reference images not displayed]

FINDINGS: Cardiac silhouette normal in size, unchanged. Airspace consolidation
in the LEFT LOWER LOBE identified on the recent prior chest CTs is
much less conspicuous on the chest x-ray. There is mild improvement
in aeration in the LEFT LOWER LOBE over the past 2 days, though
airspace opacities persist. The baseline changes of emphysema and
chronic prominence of the bronchovascular markings are again
demonstrated. No new pulmonary parenchymal abnormalities.
IMPRESSION: 1. Mild improvement in the LEFT LOWER LOBE pneumonia, though
airspace consolidation persists.
2. No new abnormalities.

## 2020-09-06 IMAGING — DX PORTABLE CHEST - 1 VIEW
1 series · 1 of 1 positions shown · non-contrast
Comparison: 04/24/2018

CLINICAL DATA: Dyspnea, shortness of breath since last night

EXAM:
PORTABLE CHEST 1 VIEW

[chest ap]
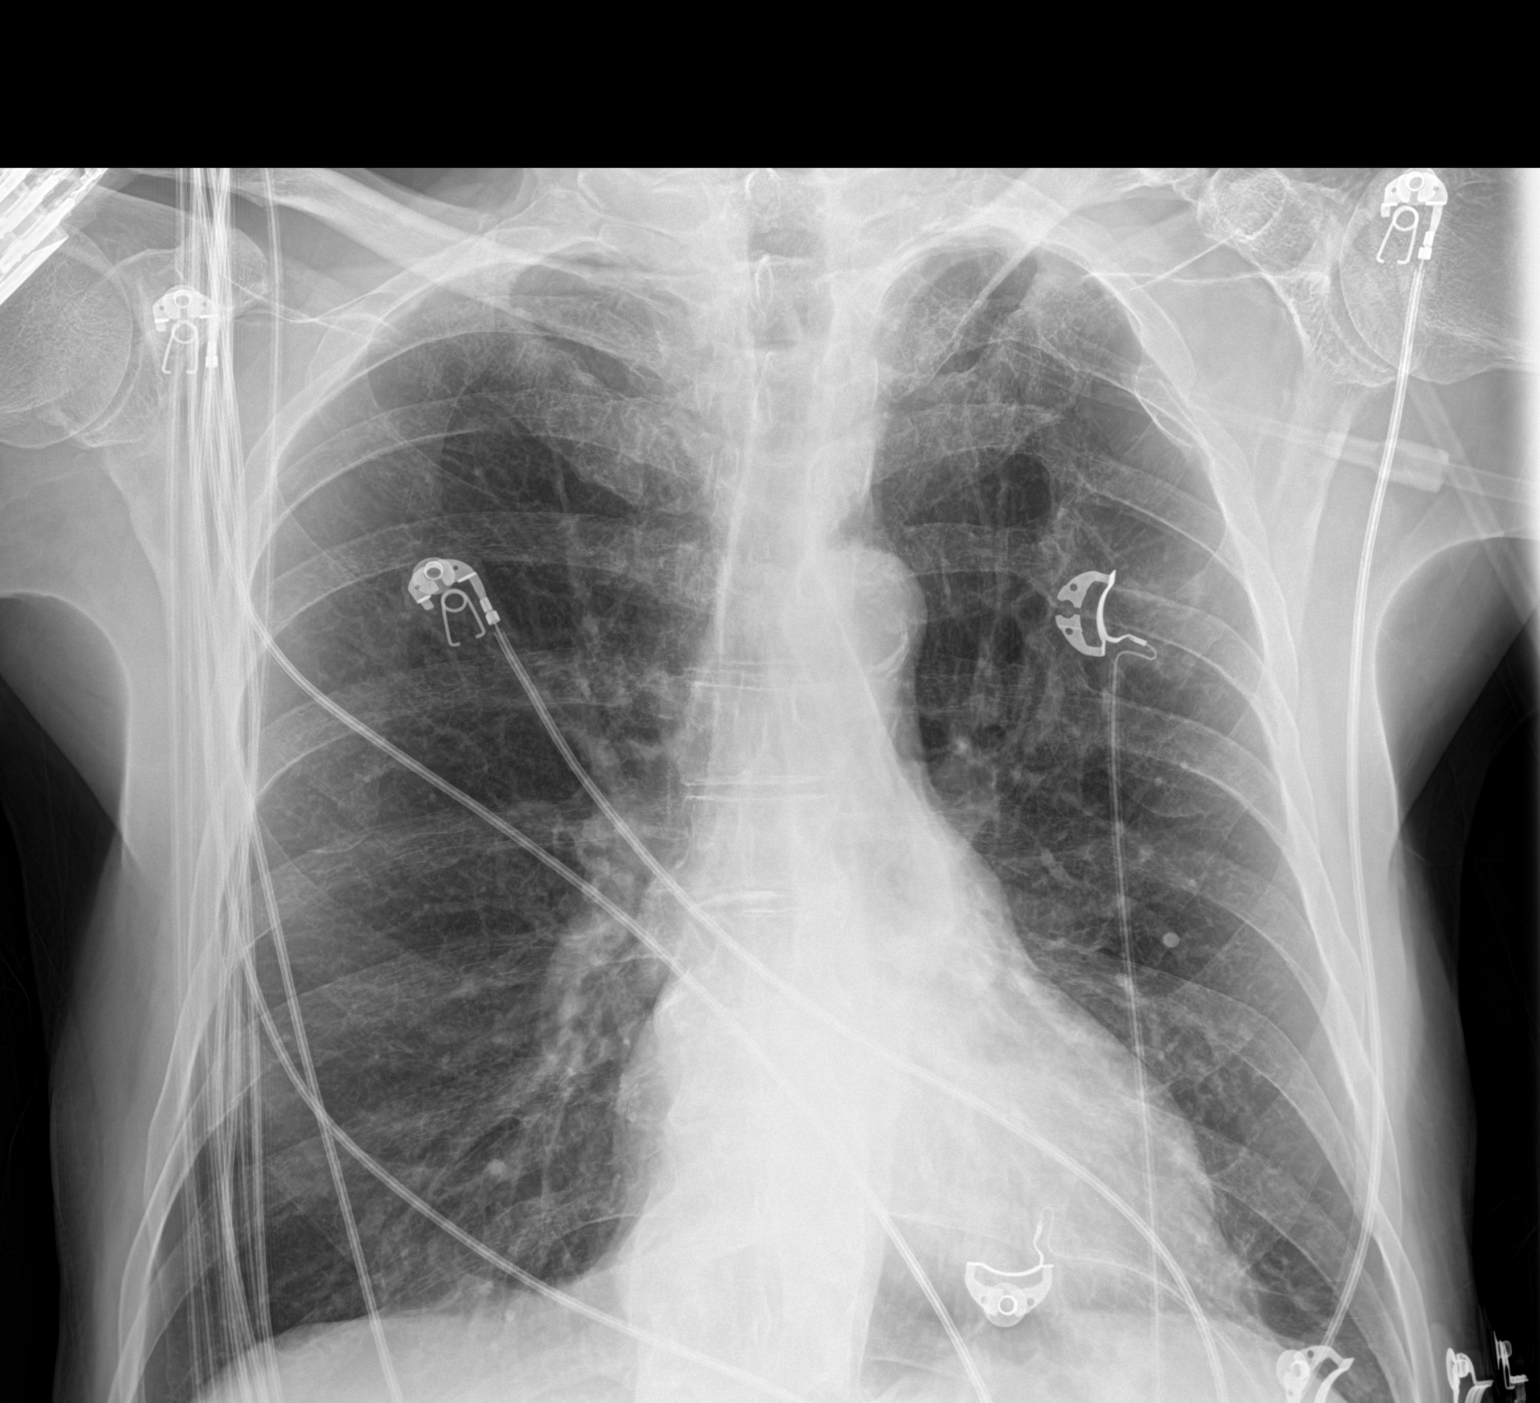

[1 of 1 positions shown; findings below may reference images not displayed]

FINDINGS: The lungs are hyperinflated likely secondary to COPD. There is no
focal consolidation. There is no pleural effusion or pneumothorax.
The heart and mediastinal contours are unremarkable.

The osseous structures are unremarkable.
IMPRESSION: No active disease.

## 2020-10-01 IMAGING — RF ESOPHAGUS/BARIUM SWALLOW/TABLET STUDY
5 of 6 series · 13 of 18 positions shown · non-contrast
Comparison: None.

CLINICAL DATA: Choking, shortness of breath, pneumonia, dysphagia,
possible aspiration

EXAM:
ESOPHOGRAM/BARIUM SWALLOW
TECHNIQUE: Single contrast examination was performed using  thin barium.
FLUOROSCOPY TIME:  Fluoroscopy Time:  1 minutes
Radiation Exposure Index (if provided by the fluoroscopic device):
4.7 mGy
Number of Acquired Spot Images: 6

[Series 1: cp_standard · 0.54mm/px · 3 of 63 frames shown (1 of 4)]
[frame 10/63]
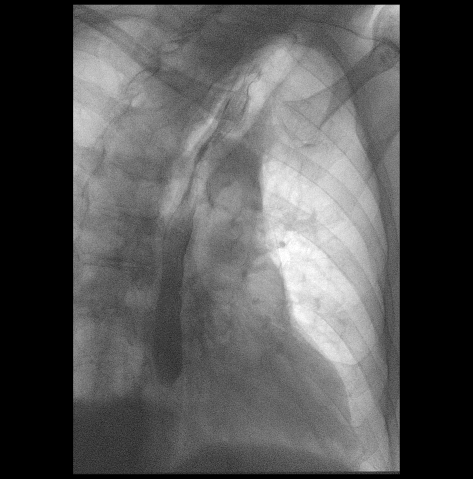
[frame 41/63]
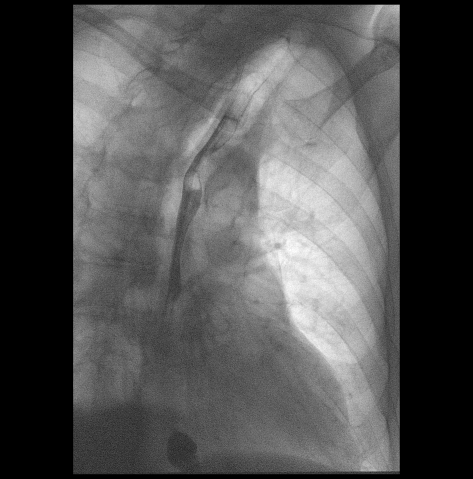
[frame 54/63]
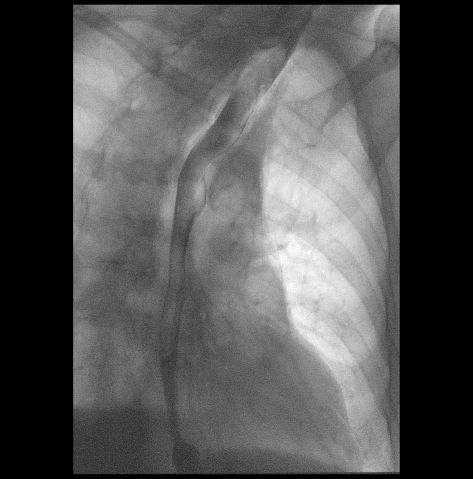

[Series 2: cp_standard · 0.53mm/px · 3 of 62 frames shown (2 of 4)]
[frame 10/62]
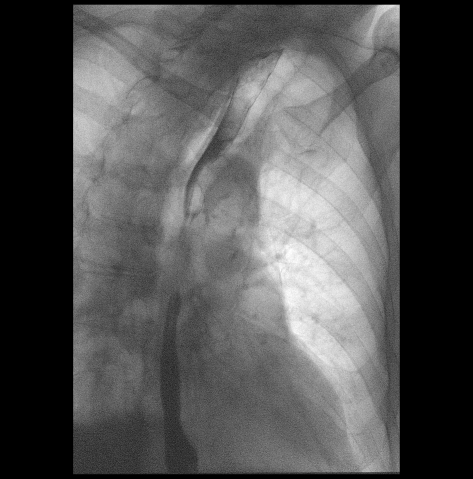
[frame 32/62]
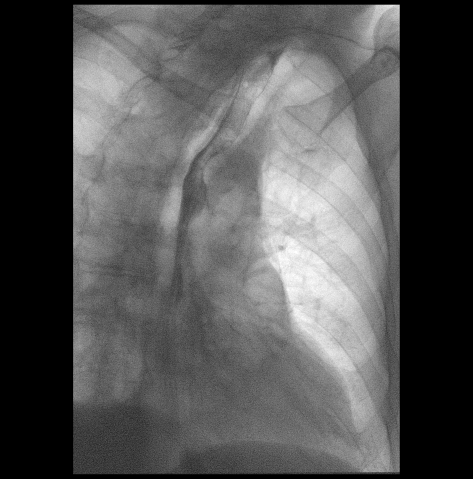
[frame 53/62]
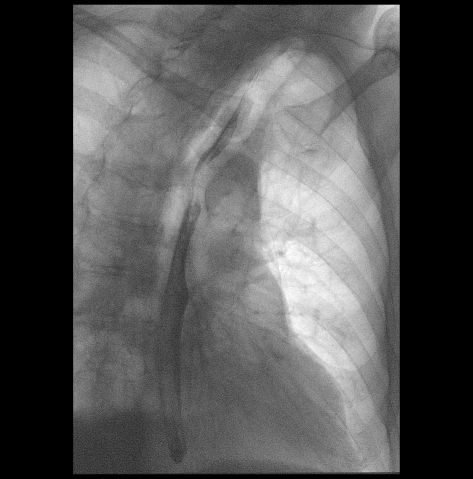

[Series 3: cp_standard · 0.53mm/px · 3 of 28 frames shown (3 of 4)]
[frame 15/28]
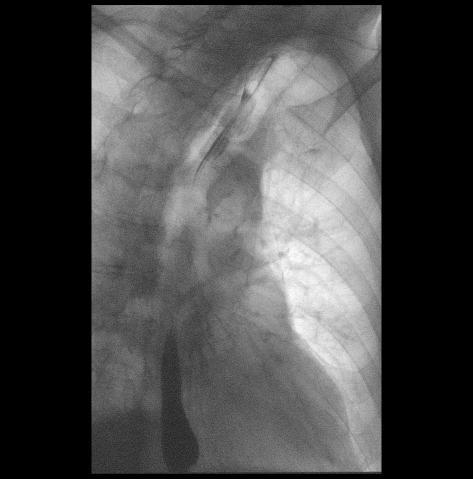
[frame 16/28]
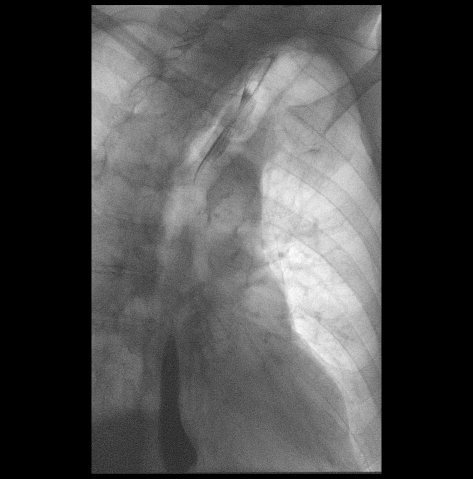
[frame 24/28]
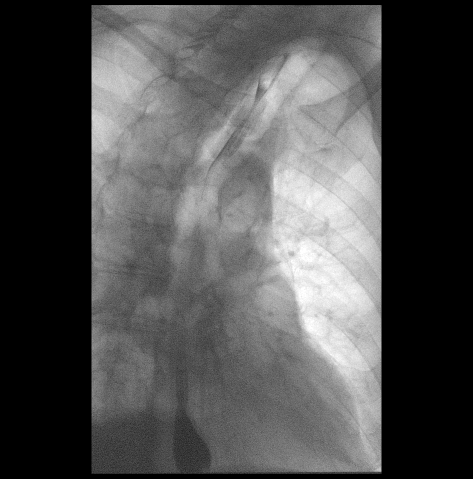

[Series 5: cp_standard · 0.53mm/px · 3 of 35 frames shown (4 of 4)]
[frame 6/35]
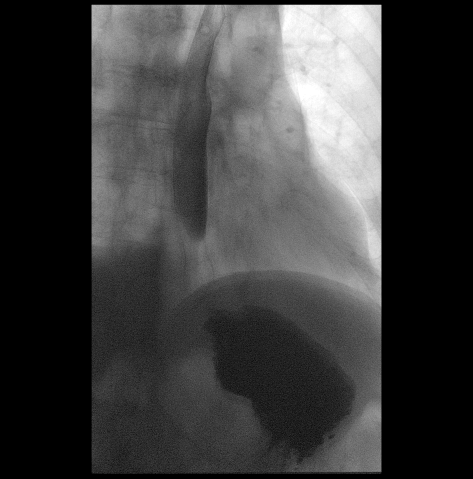
[frame 7/35]
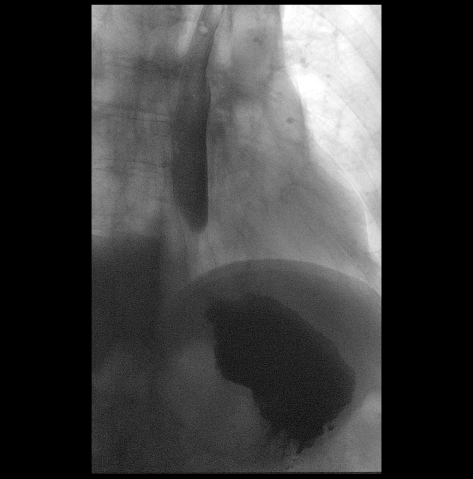
[frame 18/35]
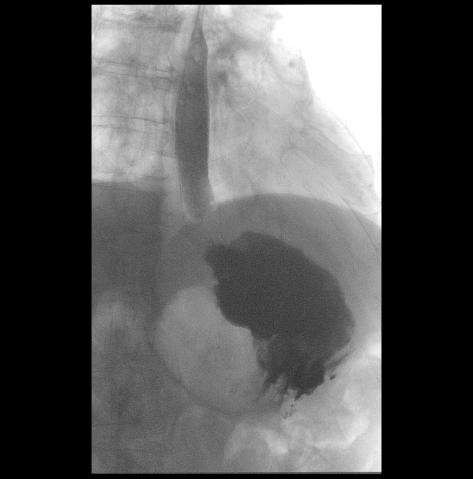

[Series 6: fluoro_barium 2fps_bw · 0.18mm/px · 1 of 1 slices shown]
[im 1/1]
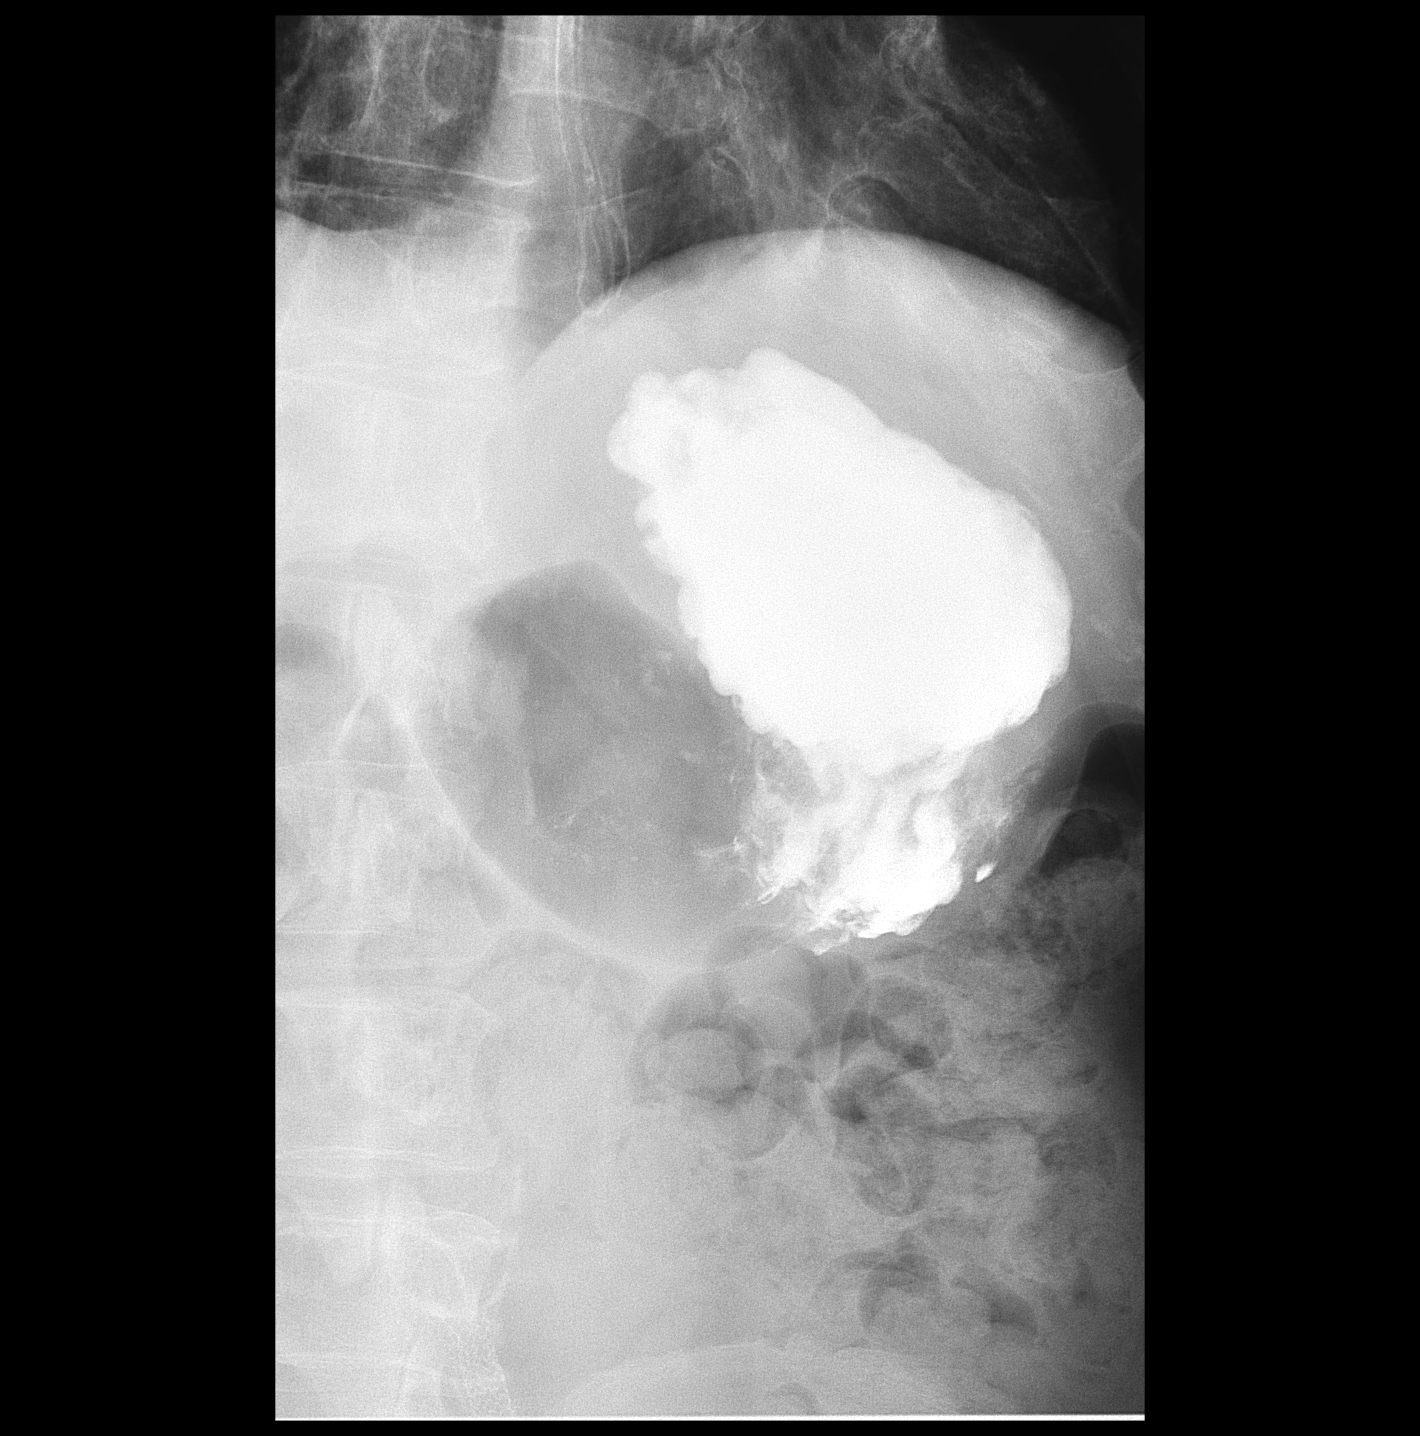

[13 of 18 positions shown; findings below may reference images not displayed]

FINDINGS: Given concern of repeated aspiration during the patient's modified
barium swallow, only a single contrast esophagram was performed in a
semi recumbent supine position. A barium tablet was not
administered.

Normal esophageal peristalsis. No fixed esophageal narrowing or
stricture. No evidence of hiatal hernia. Gastroesophageal reflux
could not be assessed.

After initial swallows, the patient had a coughing fit, likely
related to aspiration. The study was aborted.
IMPRESSION: Limited single contrast evaluation.

Suspected aspiration, resulting in cough. This was previously
confirmed on modified barium swallow.

Normal esophageal peristalsis. No fixed esophageal narrowing or
stricture. No evidence of hiatal hernia.

## 2020-10-07 IMAGING — CR PORTABLE CHEST - 1 VIEW
1 series · 1 of 1 positions shown · non-contrast
Comparison: 05/14/2018

CLINICAL DATA: Shortness of breath.

EXAM:
PORTABLE CHEST 1 VIEW

[AP]
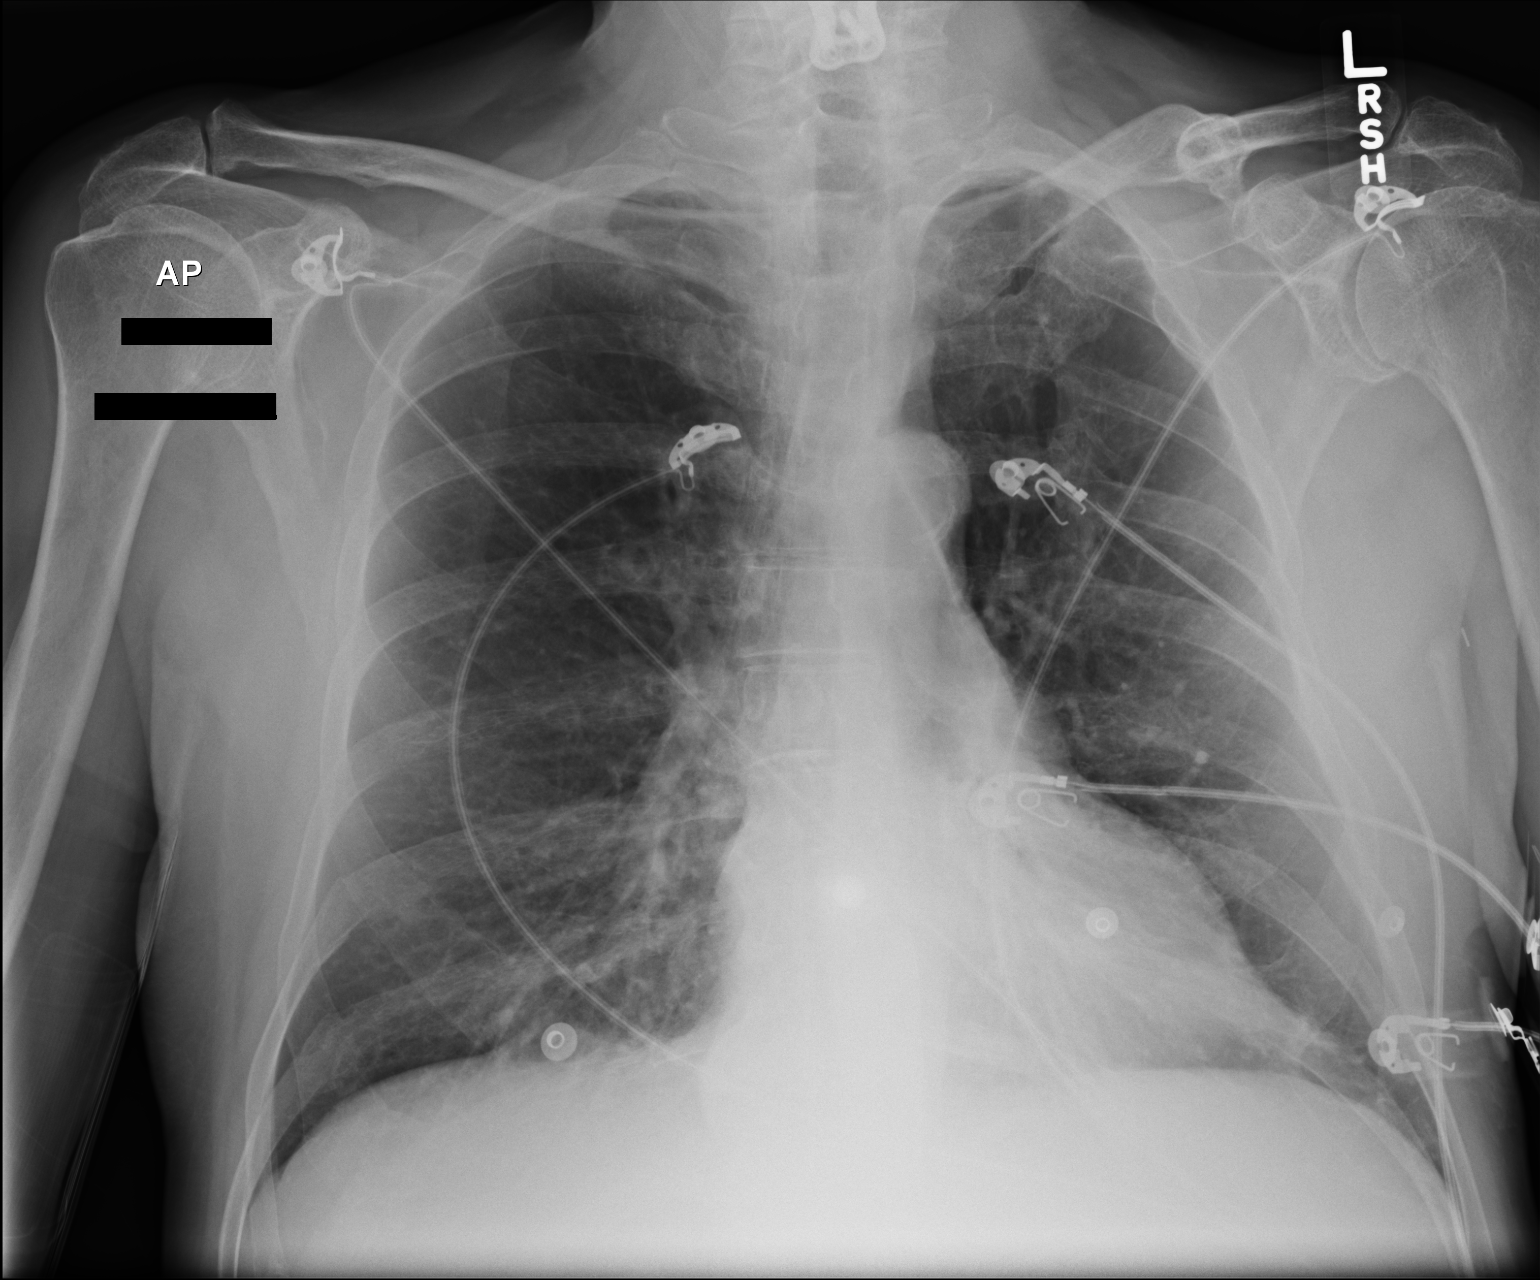

[1 of 1 positions shown; findings below may reference images not displayed]

FINDINGS: The heart size and mediastinal contours are within normal limits.
Stable emphysematous lung disease without significant
hyperinflation. Bibasilar atelectasis present. There is no evidence
of pulmonary edema, consolidation, pneumothorax, nodule or pleural
fluid. The visualized skeletal structures are unremarkable.
IMPRESSION: Stable emphysema.

## 2020-10-15 IMAGING — DX PORTABLE CHEST - 1 VIEW
1 series · 1 of 1 positions shown · non-contrast
Comparison: 06/14/2018, 05/14/2018

CLINICAL DATA: Stroke-like symptoms

EXAM:
PORTABLE CHEST 1 VIEW

[chest ap]
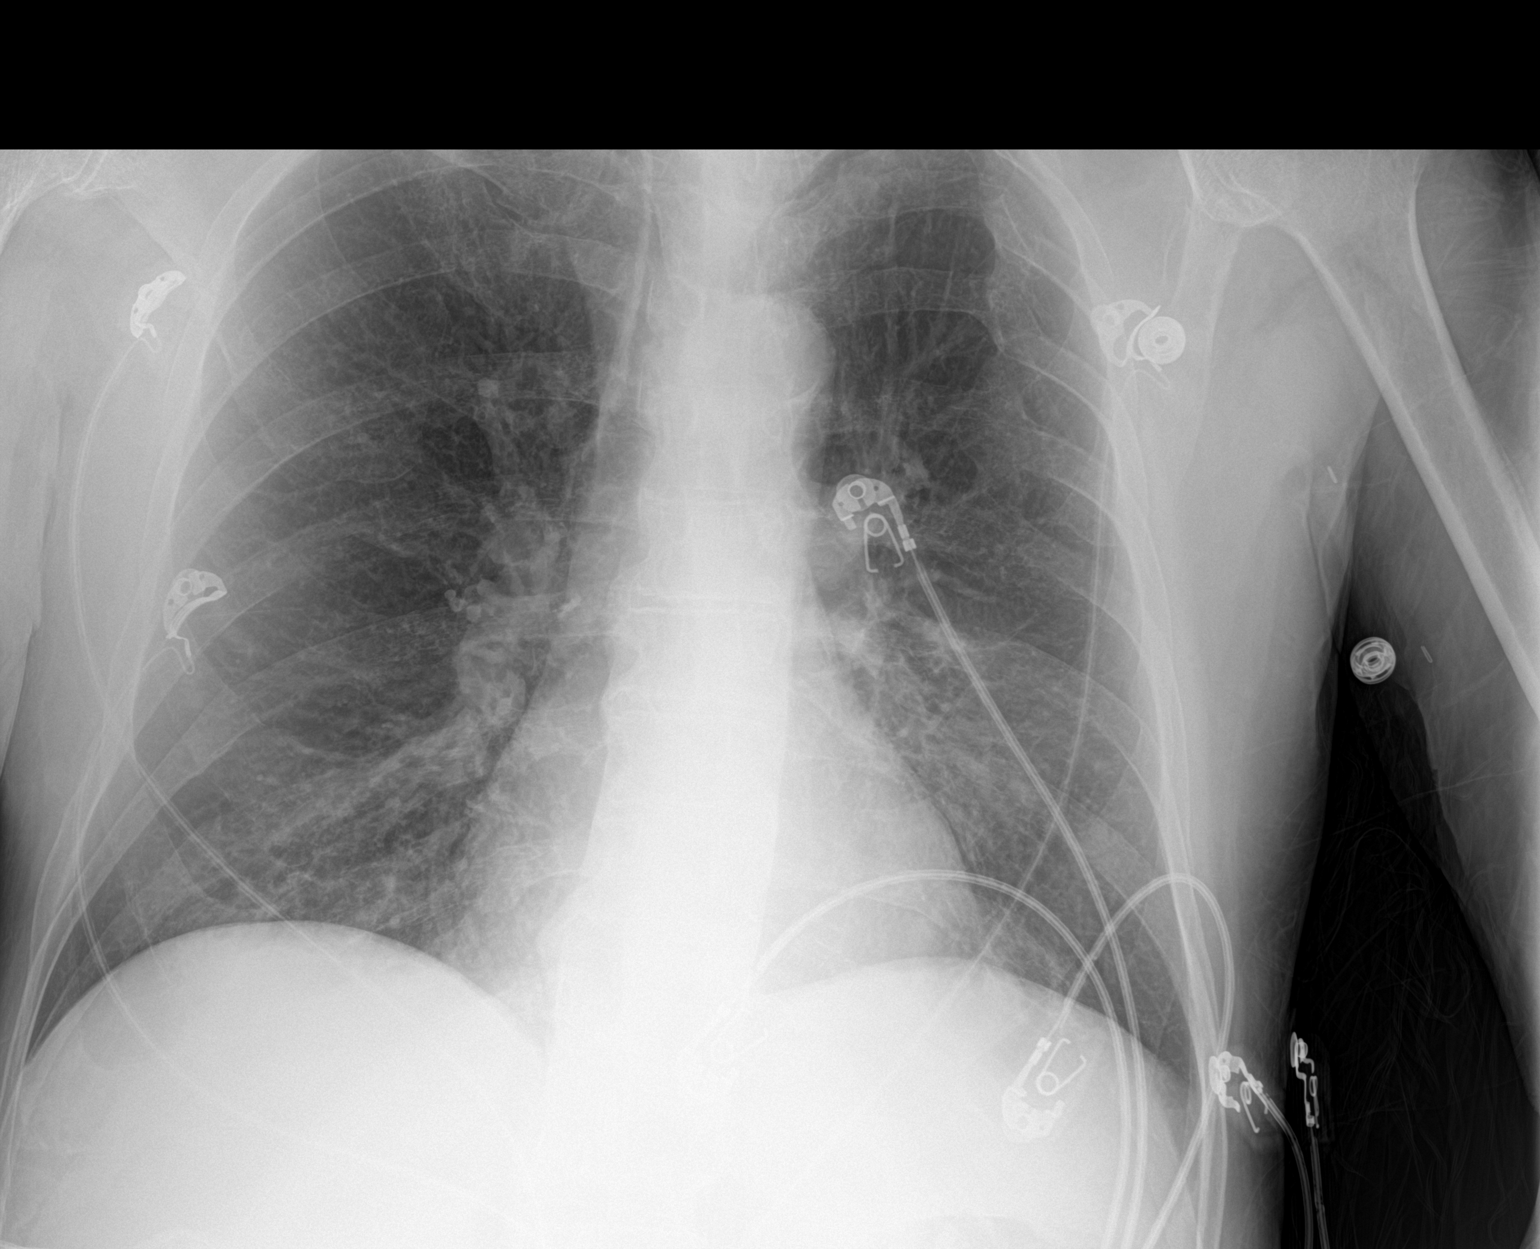

[1 of 1 positions shown; findings below may reference images not displayed]

FINDINGS: No acute opacity or pleural effusion. Stable cardiomediastinal
silhouette. No pneumothorax. Old left upper rib fractures. Aortic
atherosclerosis.
IMPRESSION: No active disease.

## 2020-10-16 IMAGING — MR MRI HEAD WITHOUT CONTRAST
11 of 12 series · 43 of 48 positions shown · non-contrast
Comparison: CT studies done yesterday.

CLINICAL DATA: Acute presentation with left-sided weakness and left
facial droop. Chronic left ICA occlusion. New, presumably acute
right ICA occlusion. Right M2 embolus.

EXAM:
MRI HEAD WITHOUT CONTRAST
TECHNIQUE: Multiplanar, multiecho pulse sequences of the brain and surrounding
structures were obtained without intravenous contrast.

[Series 5: DWI · axial · 3.0mm · 0.88mm/px · z∈[+19,+151]mm · 7 of 96 slices shown (1 of 4)]
[im 1/96]
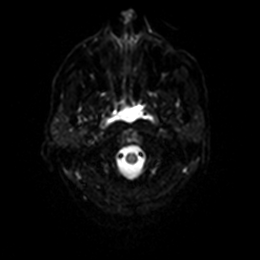
[im 16/96]
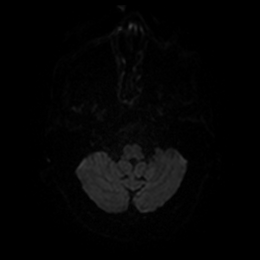
[im 32/96]
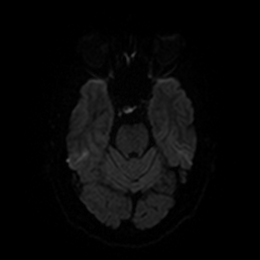
[im 48/96]
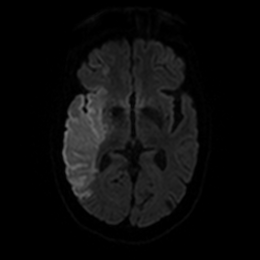
[im 64/96]
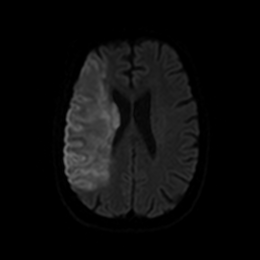
[im 80/96]
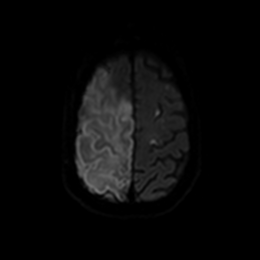
[im 96/96]
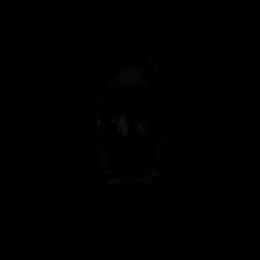

[Series 6: DWI · axial · 3.0mm · 0.88mm/px · z∈[+19,+151]mm · 4 of 47 slices shown (2 of 4)]
[im 1/47]
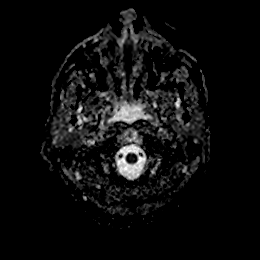
[im 16/47]
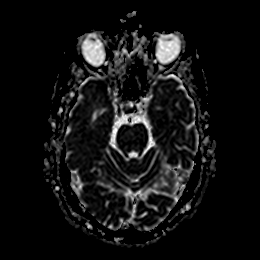
[im 31/47]
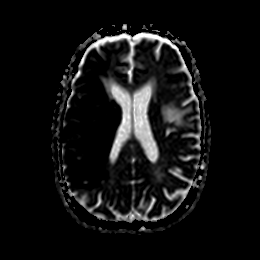
[im 47/47]
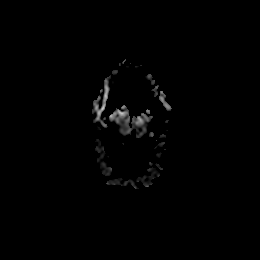

[Series 7: DWI · coronal · 4.0mm · 0.88mm/px · 6 of 72 slices shown (3 of 4)]
[im 1/72]
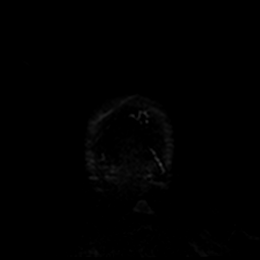
[im 15/72]
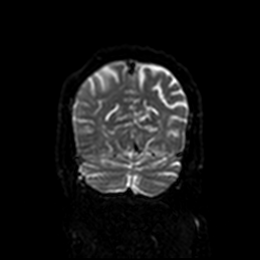
[im 29/72]
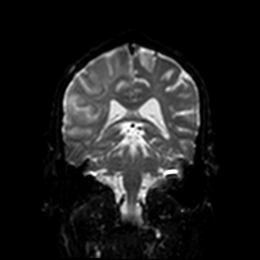
[im 43/72]
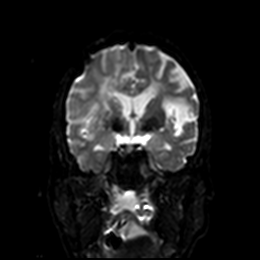
[im 57/72]
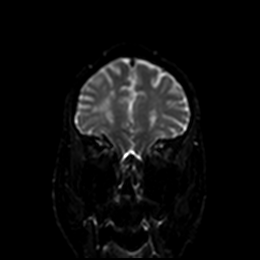
[im 72/72]
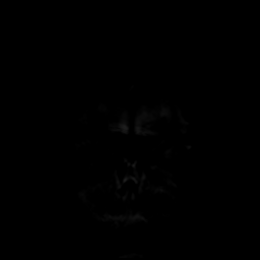

[Series 8: DWI · coronal · 4.0mm · 0.88mm/px · 3 of 36 slices shown (4 of 4)]
[im 1/36]
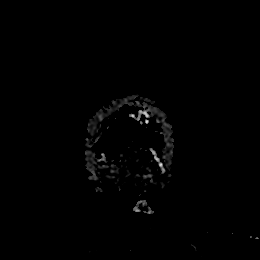
[im 18/36]
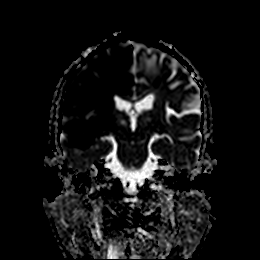
[im 36/36]
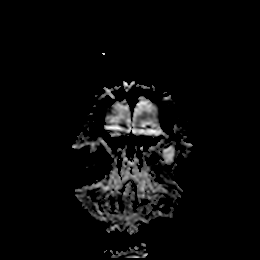

[Series 9: T1 · sagittal · 5.0mm · 0.75mm/px · 2 of 23 slices shown]
[im 1/23]
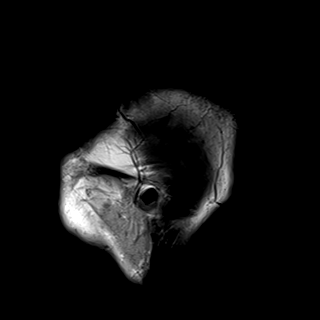
[im 23/23]
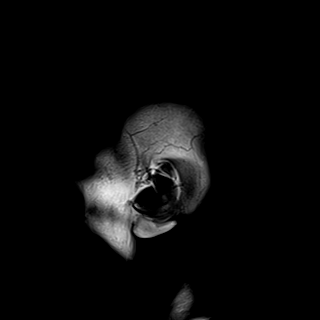

[Series 10: T2 · axial · 5.0mm · 0.72mm/px · z∈[+22,+157]mm · 2 of 25 slices shown (1 of 2)]
[im 1/25]
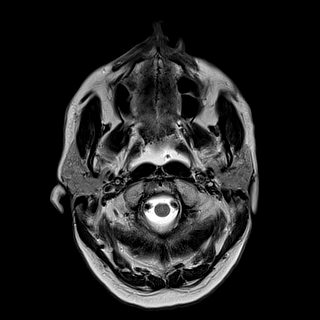
[im 25/25]
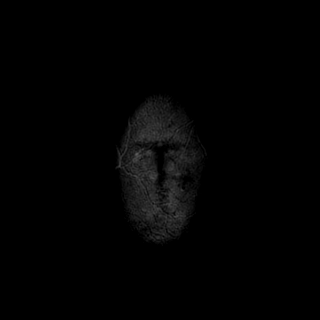

[Series 11: FLAIR · axial · 5.0mm · 0.45mm/px · z∈[+23,+158]mm · 2 of 25 slices shown]
[im 1/25]
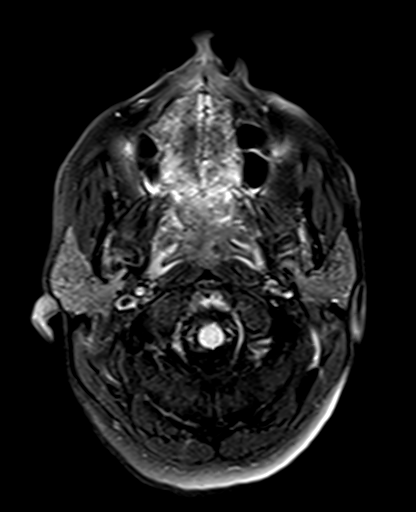
[im 25/25]
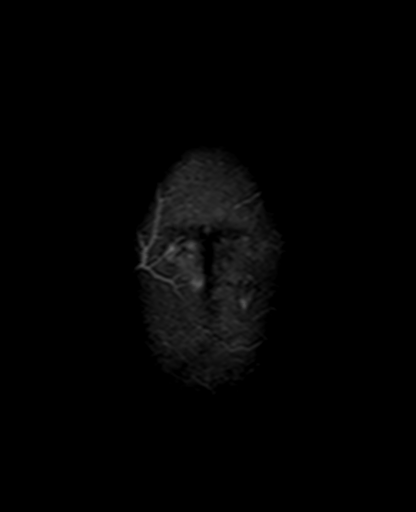

[Series 13: pha_images · axial · 3.0mm · 0.90mm/px · z∈[+12,+155]mm · 5 of 52 slices shown]
[im 1/52]
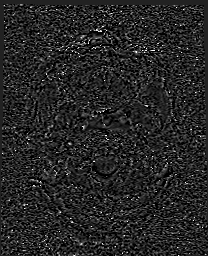
[im 13/52]
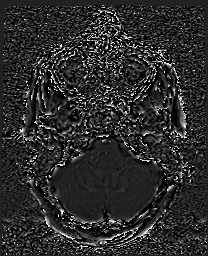
[im 26/52]
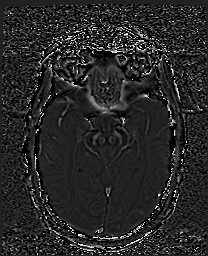
[im 39/52]
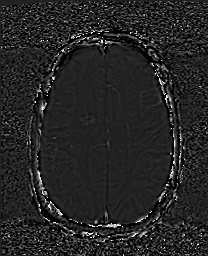
[im 52/52]
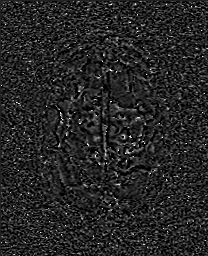

[Series 14: swi_images · axial · 3.0mm · 0.90mm/px · z∈[+12,+155]mm · 5 of 52 slices shown]
[im 1/52]
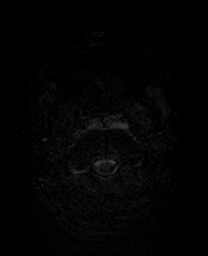
[im 13/52]
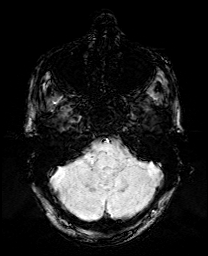
[im 26/52]
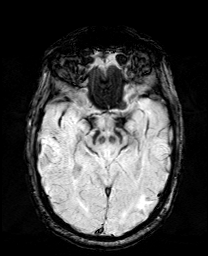
[im 39/52]
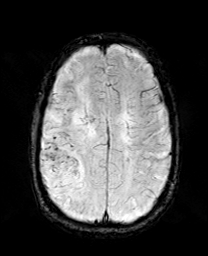
[im 52/52]
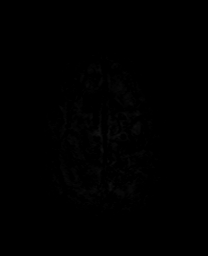

[Series 15: mip_images(sw) · axial · 24.0mm · 0.90mm/px · z∈[+22,+146]mm · 4 of 45 slices shown]
[im 1/45]
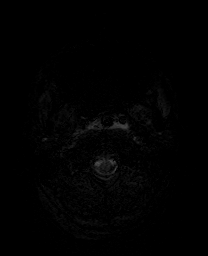
[im 15/45]
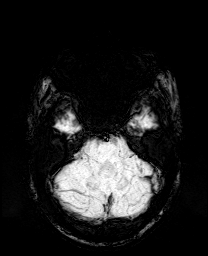
[im 30/45]
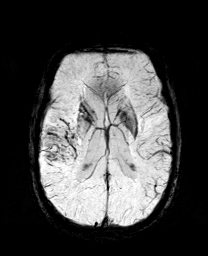
[im 45/45]
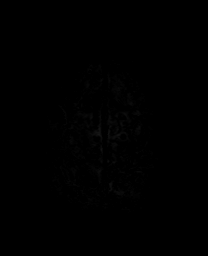

[Series 17: T2 · coronal · 5.0mm · 0.34mm/px · 3 of 29 slices shown (2 of 2)]
[im 1/29]
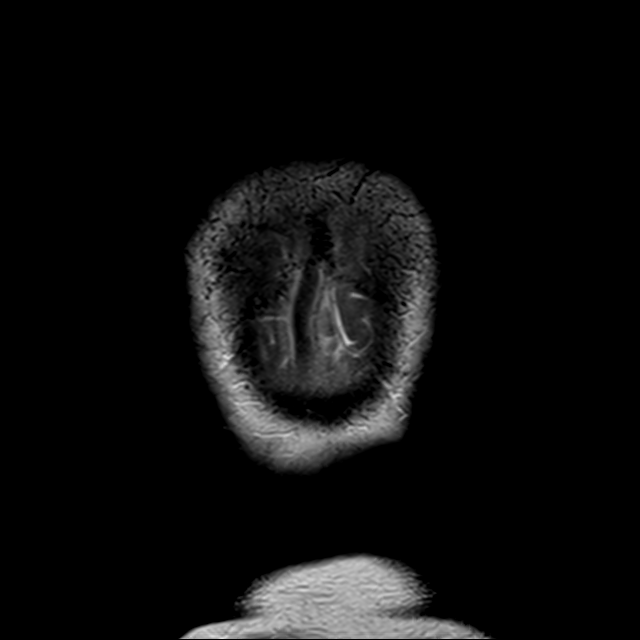
[im 15/29]
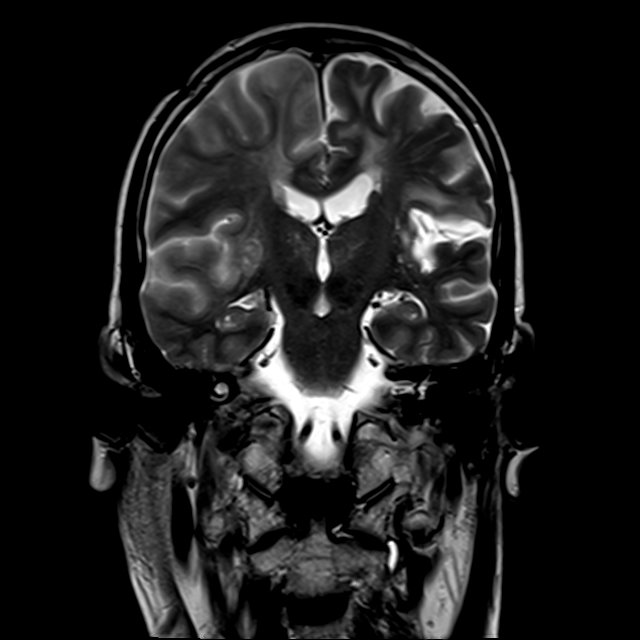
[im 29/29]
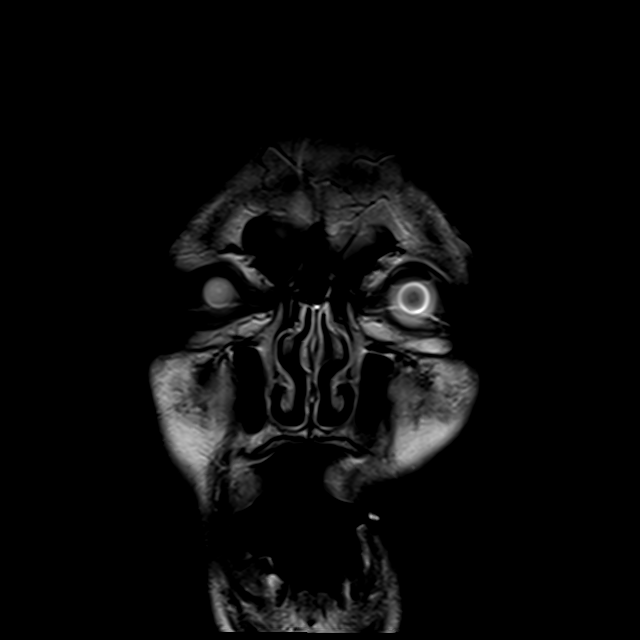

[43 of 48 positions shown; findings below may reference images not displayed]

FINDINGS: Brain: MRI shows acute infarction throughout the majority of the
right MCA territory including the basal ganglia, insular region,
lateral frontal lobe and frontoparietal region. The area of
involvement is much larger than was suggested by perfusion imaging
yesterday. There is early brain swelling. There are petechial blood
products in the deep insula and frontoparietal junction region but
no frank hematoma. The patient also demonstrates some acute punctate
watershed infarctions in the left hemispheric stenting from front to
back. No large confluent infarction on the left.

Chronic infarction in the left insula, frontal operculum and
parietal lobe as seen previously. No hydrocephalus. No extra-axial
collection.

Vascular: Abnormal flow at the internal carotid arteries in the
skull base region. Vertebrobasilar flow is present.

Skull and upper cervical spine: Negative

Sinuses/Orbits: Clear/normal

Other: None
IMPRESSION: Acute infarction in the complete right MCA distribution, a much
larger region of involvement than was demonstrated at CT perfusion
yesterday. Early swelling. Petechial blood products without frank
hematoma. Class 1 a HI 1

Acute punctate watershed infarctions in the left hemisphere running
from front to back.

## 2020-10-16 IMAGING — DX PORTABLE CHEST - 1 VIEW
1 series · 2 of 2 positions shown · non-contrast
Comparison: 06/22/2018 and earlier.

CLINICAL DATA: 57-year-old male intubated, enteric tube placement.
Stroke, large vessel occlusion.

EXAM:
PORTABLE CHEST 1 VIEW

[Series 1: chest ap · 0.14mm/px · 2 of 2 slices shown]
[im 1/2]
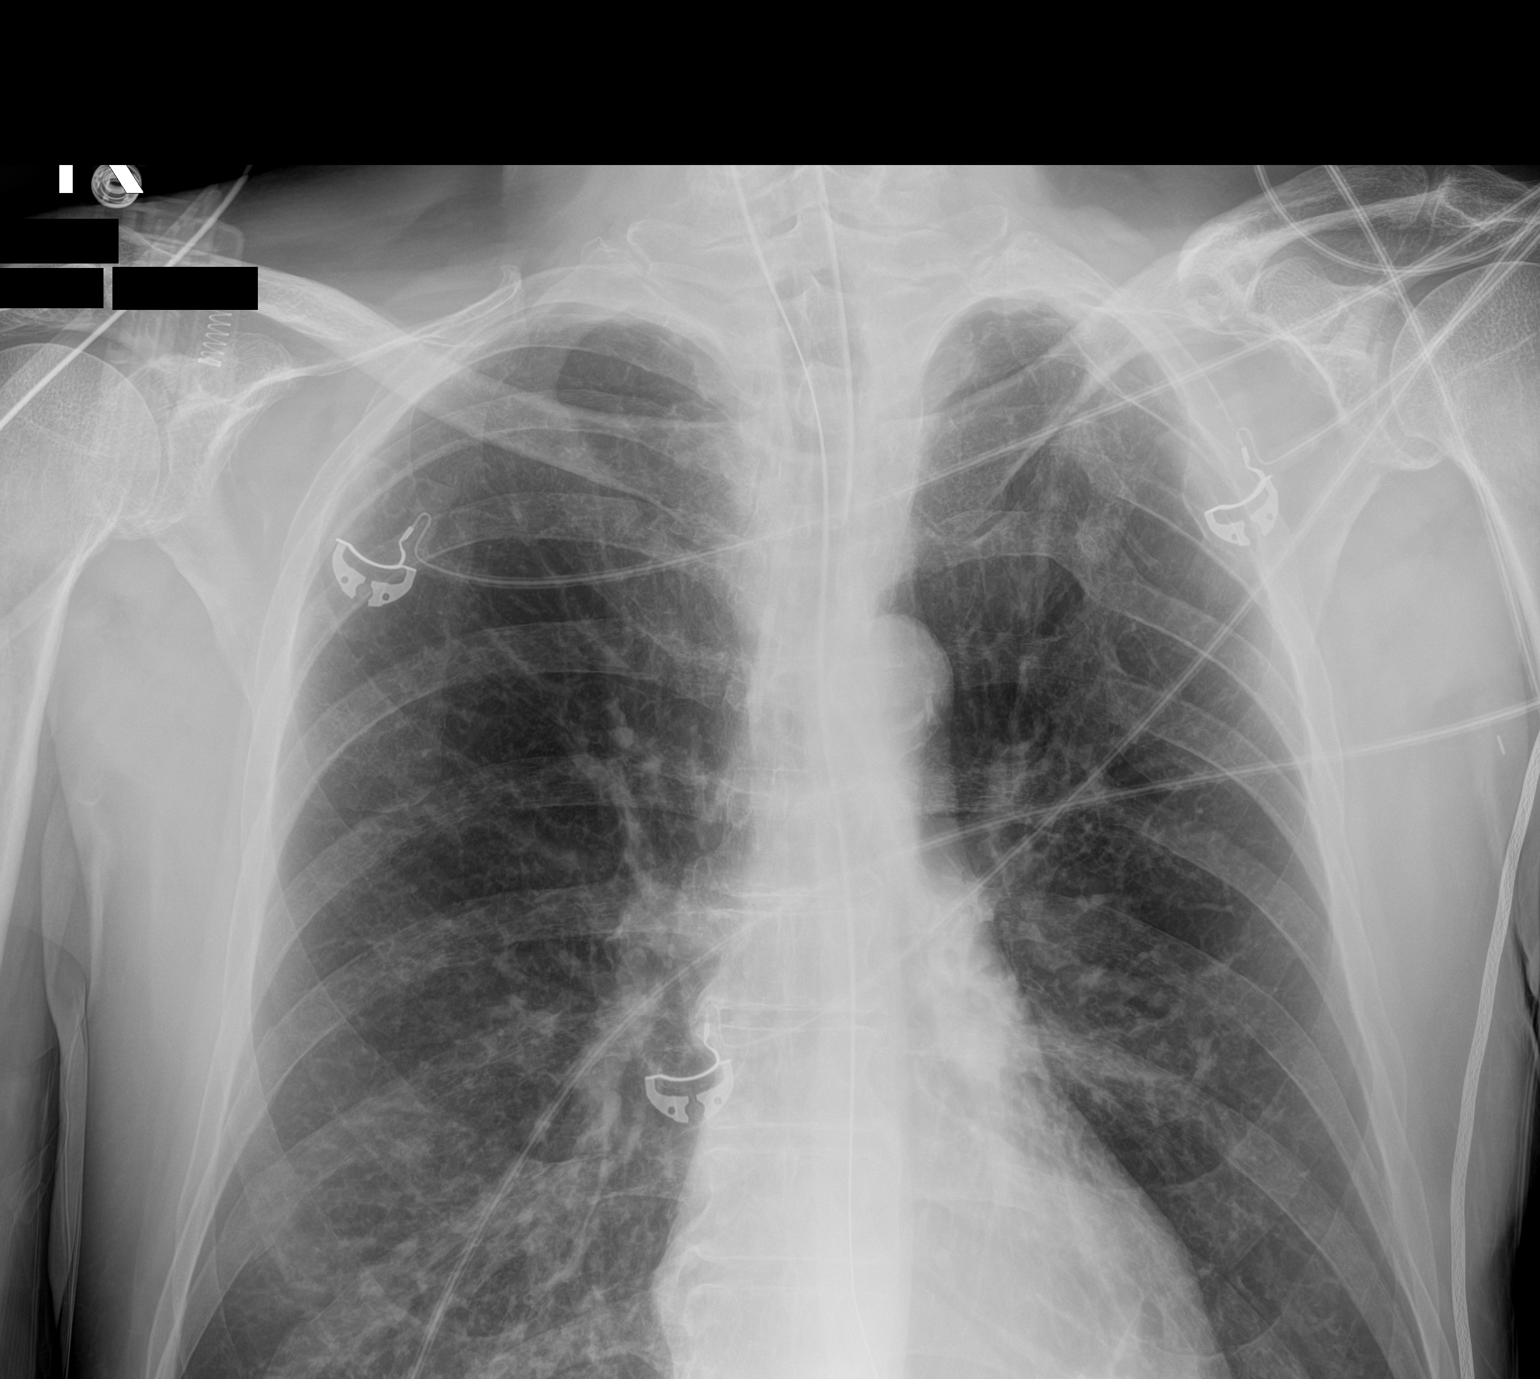
[im 2/2]
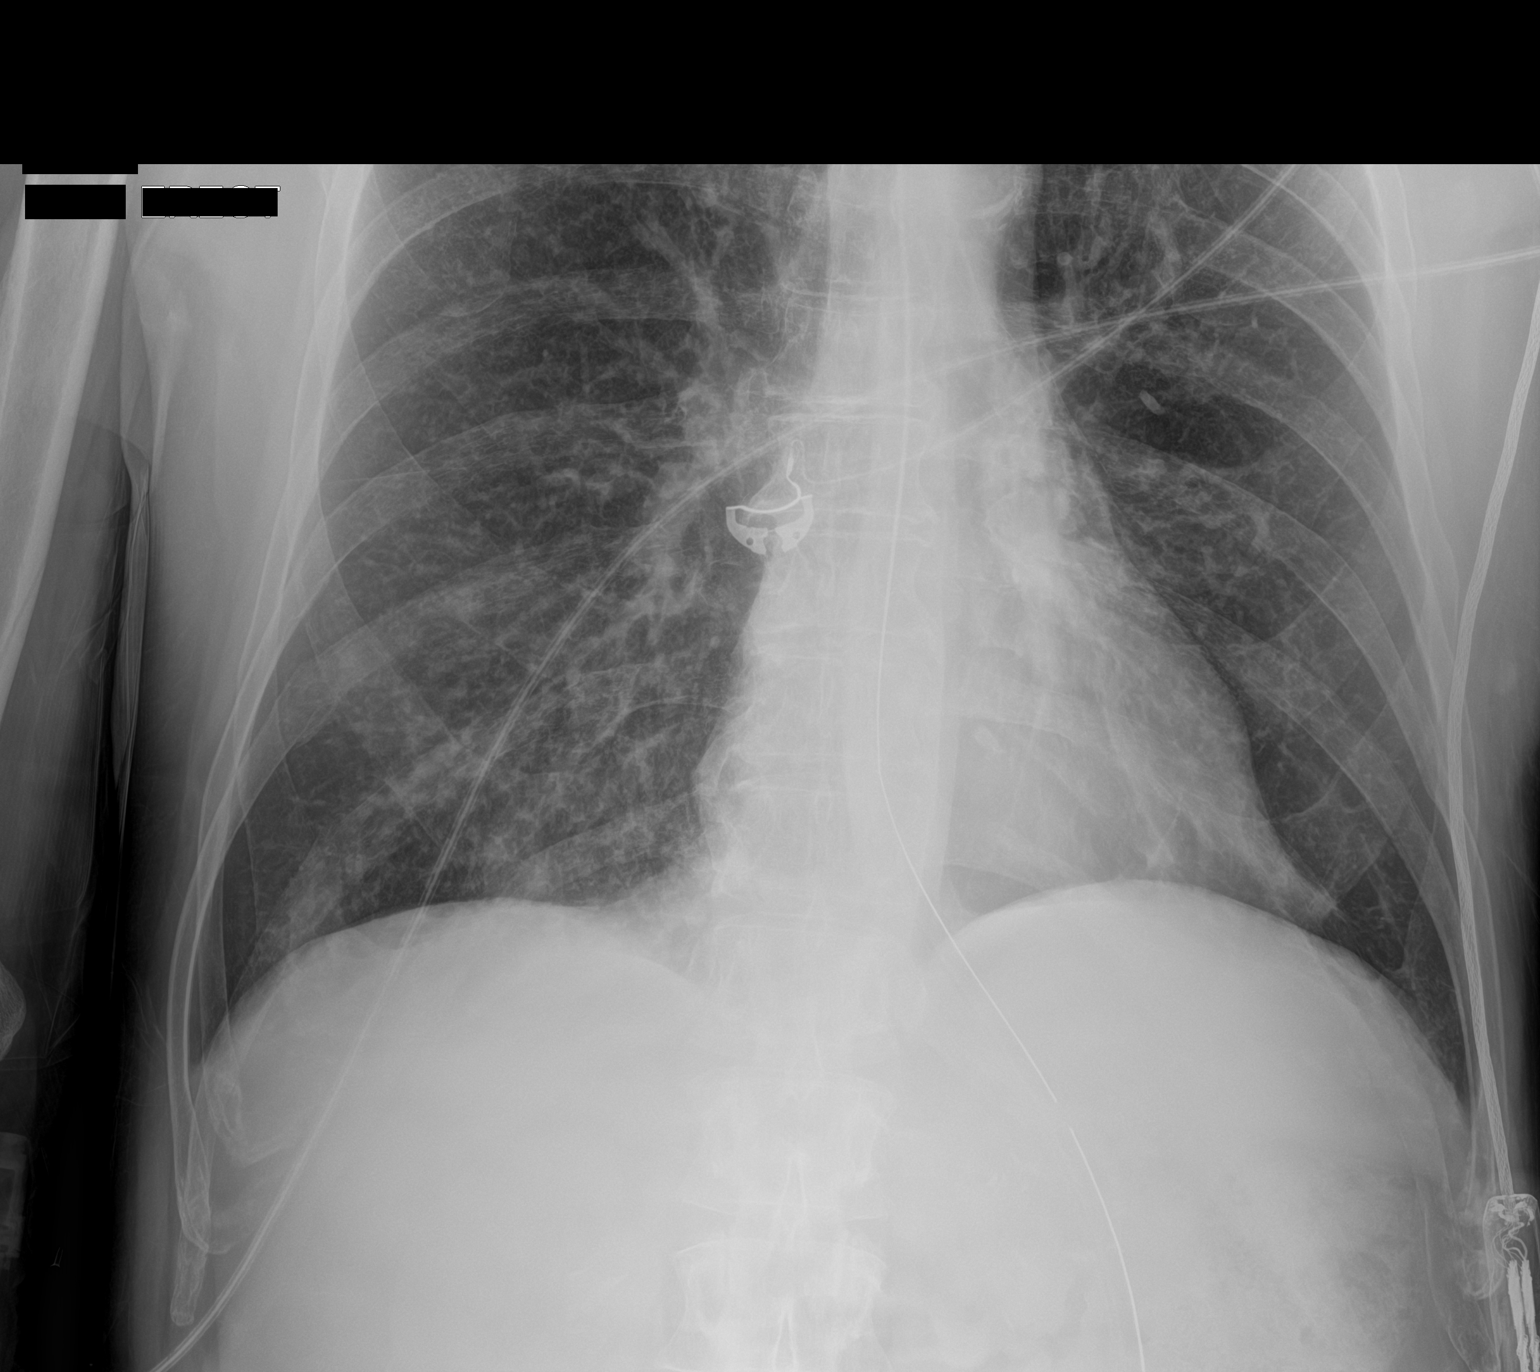

[2 of 2 positions shown; findings below may reference images not displayed]

FINDINGS: Portable AP semi upright view at 1883 hours. Endotracheal tube tip
at the level the clavicles. Enteric tube courses to the abdomen,
side hole the level of the gastric body.

Larger lung volumes. Mediastinal contours remain normal. Allowing
for portable technique the lungs are clear. No pneumothorax.
Partially visible cervical ACDF. Paucity of bowel gas.

Chronic left clavicle and rib fractures.
IMPRESSION: 1. ET tube and enteric tube in good position.
2. No acute cardiopulmonary abnormality.

## 2020-10-17 IMAGING — CT CT HEAD WITHOUT CONTRAST
5 of 8 series · 17 of 47 positions shown, 18 images · non-contrast
Comparison: MRI brain 06/23/2018.

CLINICAL DATA: Stroke, follow-up.

EXAM:
CT HEAD WITHOUT CONTRAST
TECHNIQUE: Contiguous axial images were obtained from the base of the skull
through the vertex without intravenous contrast.

[Series 3: head bone · axial · 0.52mm/px · z∈[-140,+12]mm · 8 of 96 slices shown]
[im 10/96  bone]
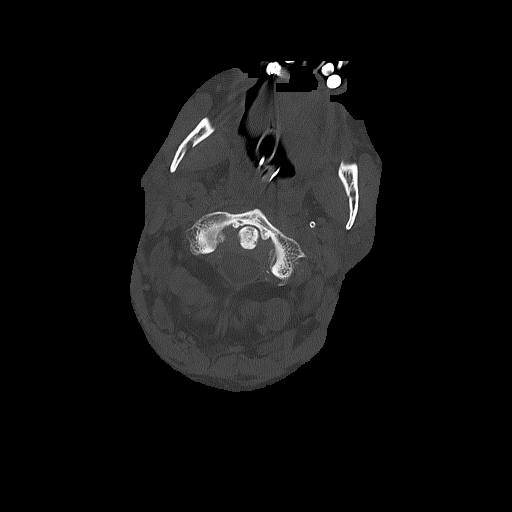
[im 20/96  bone]
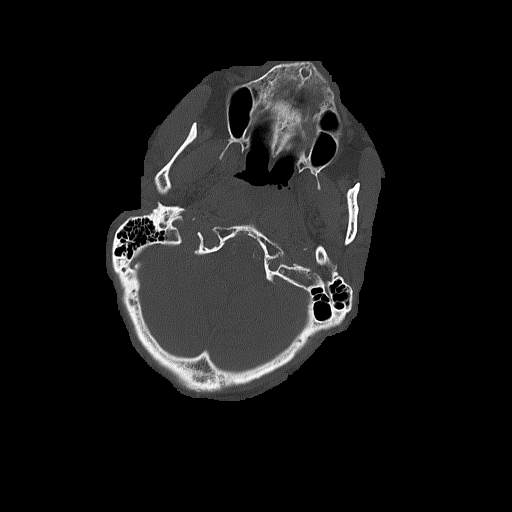
[im 29/96  bone]
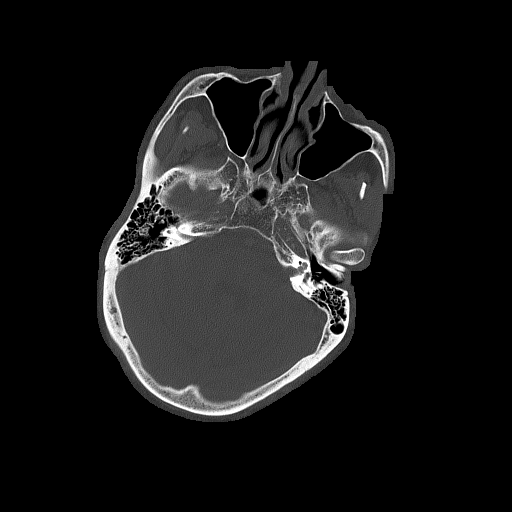
[im 39/96  bone]
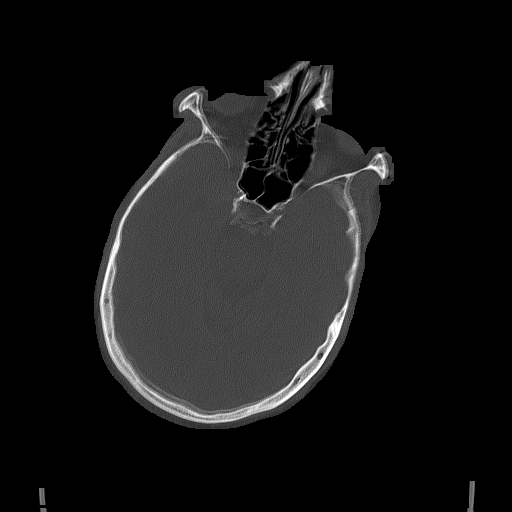
[im 58/96  bone]
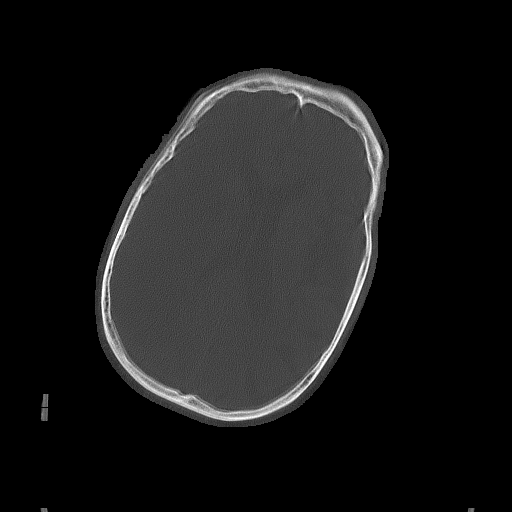
[im 67/96  bone]
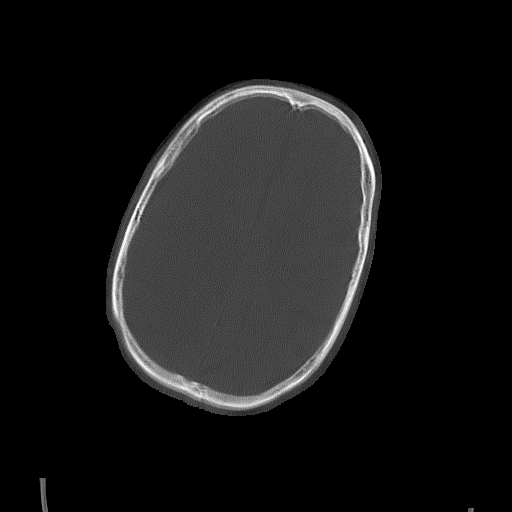
[im 77/96  bone]
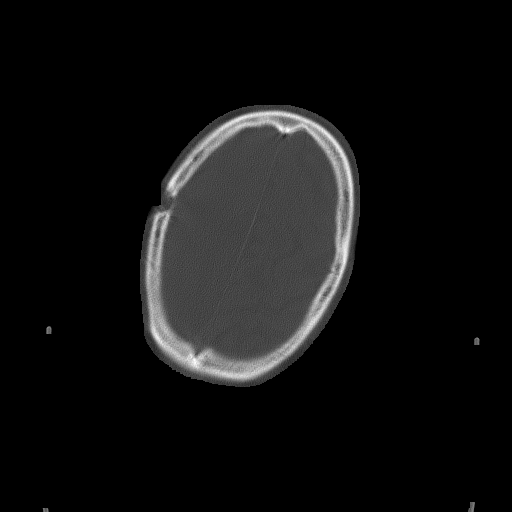
[im 86/96  bone]
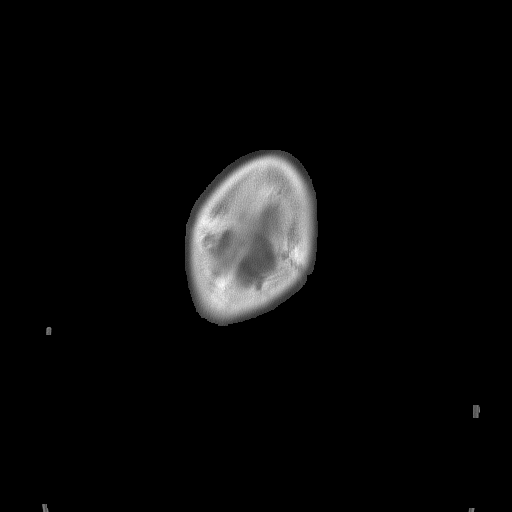

[Series 4: head without · axial · non-contrast · 0.52mm/px · z∈[-98,-33]mm · 2 of 39 slices shown, 3 images (1 of 2)]
[im 13/39  brain]
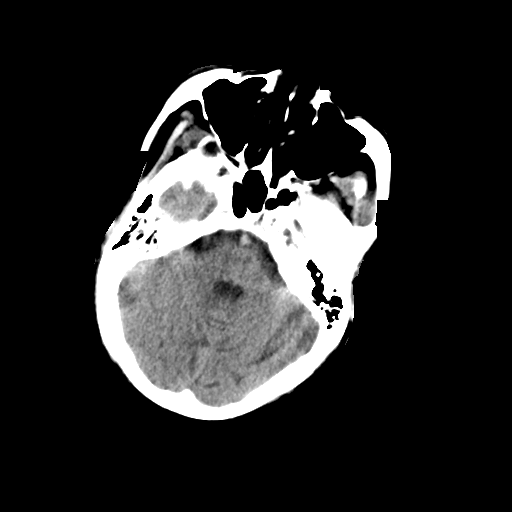
[im 13/39  bone]
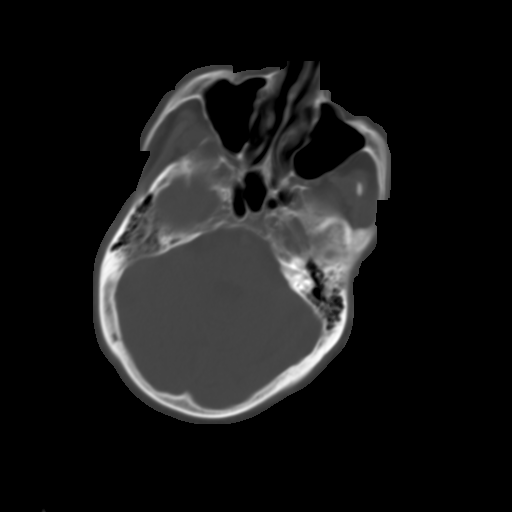
[im 26/39  brain]
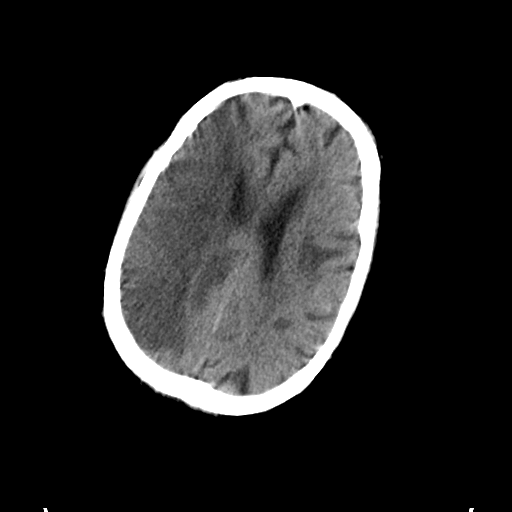

[Series 6: head without cor · coronal · non-contrast · 0.38mm/px · 3 of 83 slices shown]
[im 21/83  brain]
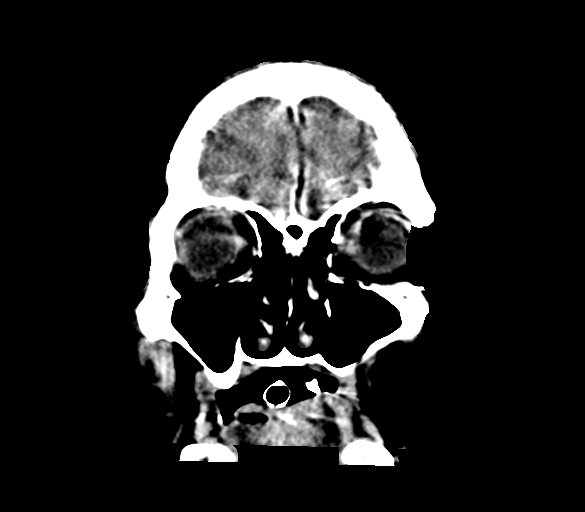
[im 42/83  brain]
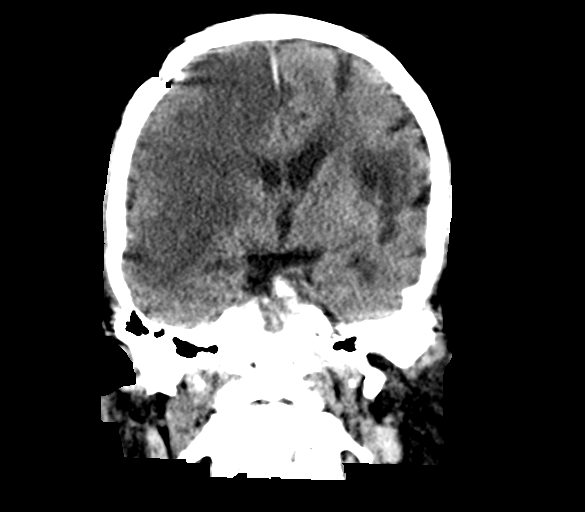
[im 62/83  brain]
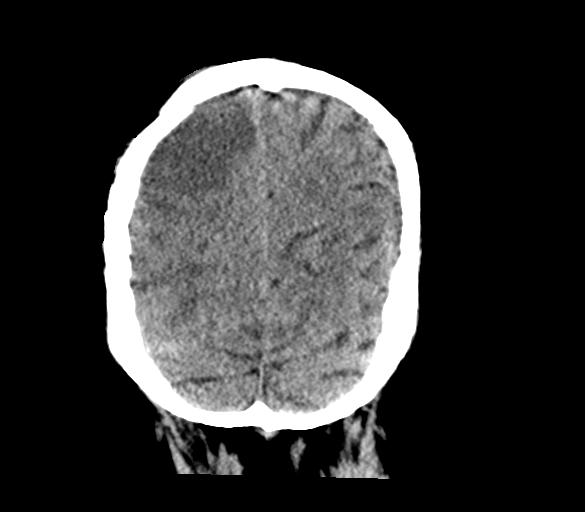

[Series 7: head without sag · sagittal · non-contrast · 0.39mm/px · 2 of 67 slices shown]
[im 23/67  brain]
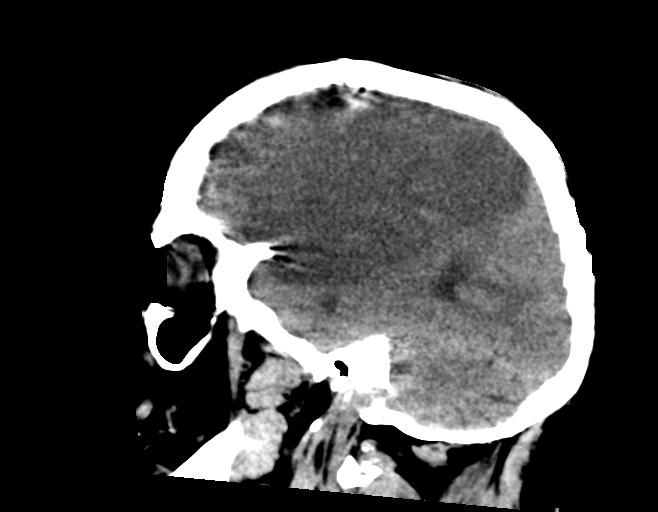
[im 45/67  brain]
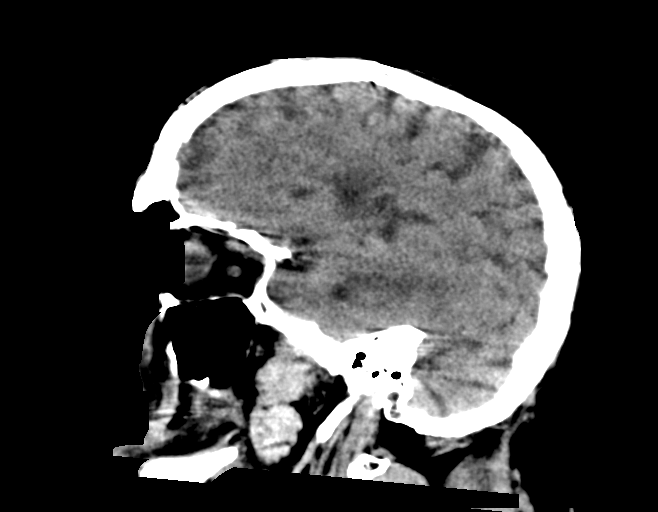

[Series 8: head without · axial · non-contrast · 0.52mm/px · z∈[-98,-33]mm · 2 of 39 slices shown (2 of 2)]
[im 13/39  brain]
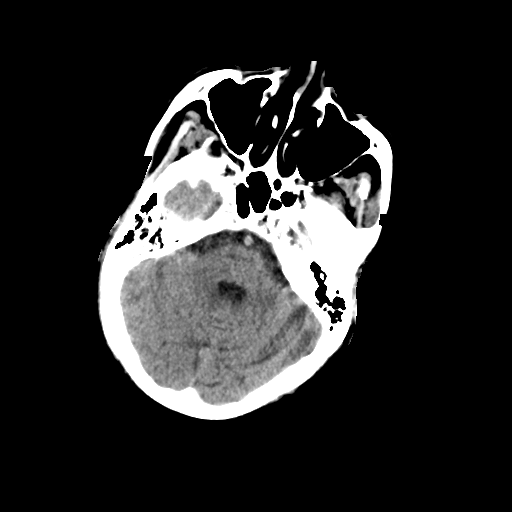
[im 26/39  brain]
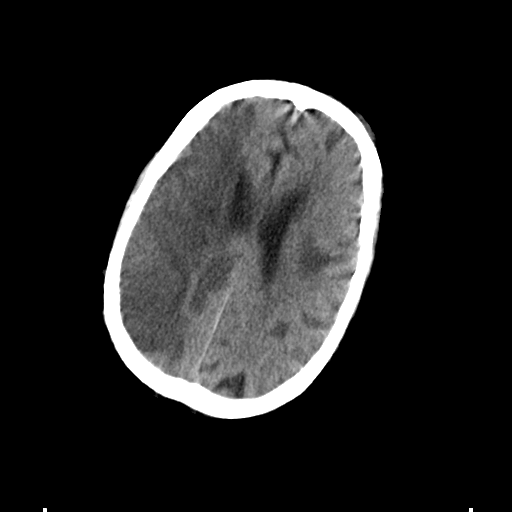

[17 of 47 positions shown; findings below may reference images not displayed]

FINDINGS: Brain: Large right MCA distribution infarct is again noted. There is
diffuse hypoattenuation with some sparing of the anterior right
frontal lobe and anterior basal ganglia. Inferior temporal and
posterior parietal lobes are spared. No significant hemorrhage is
present. There is diffuse effacement of the sulci. 1-2 mm midline
shift is present.

Remote left temporal and insular ribbon infarcts are again seen.
Most left parietal lobe infarct is noted. Brainstem and cerebellum
are within normal limits.

Vascular: Extensive vascular calcifications are again noted.

Skull: Calvarium is intact. No focal lytic or blastic lesions are
present. No significant extracranial lesions are present.

Sinuses/Orbits: The paranasal sinuses and mastoid air cells are
clear. The globes and orbits are within normal limits.
IMPRESSION: 1. Expected evolution of large right MCA territory infarct without
evidence for significant hemorrhagic conversion.
2. Remote left temporal, insular, and parietal infarcts.
3. Extensive atherosclerotic calcifications consistent with known
bilateral ICA occlusions.

## 2020-10-19 IMAGING — DX PORTABLE CHEST - 1 VIEW
2 series · 2 of 2 positions shown · non-contrast
Comparison: 06/23/2018

CLINICAL DATA: Endotracheal tube present. COPD. Congestive heart
failure.

EXAM:
PORTABLE CHEST 1 VIEW

[chest ap (1 of 2)]
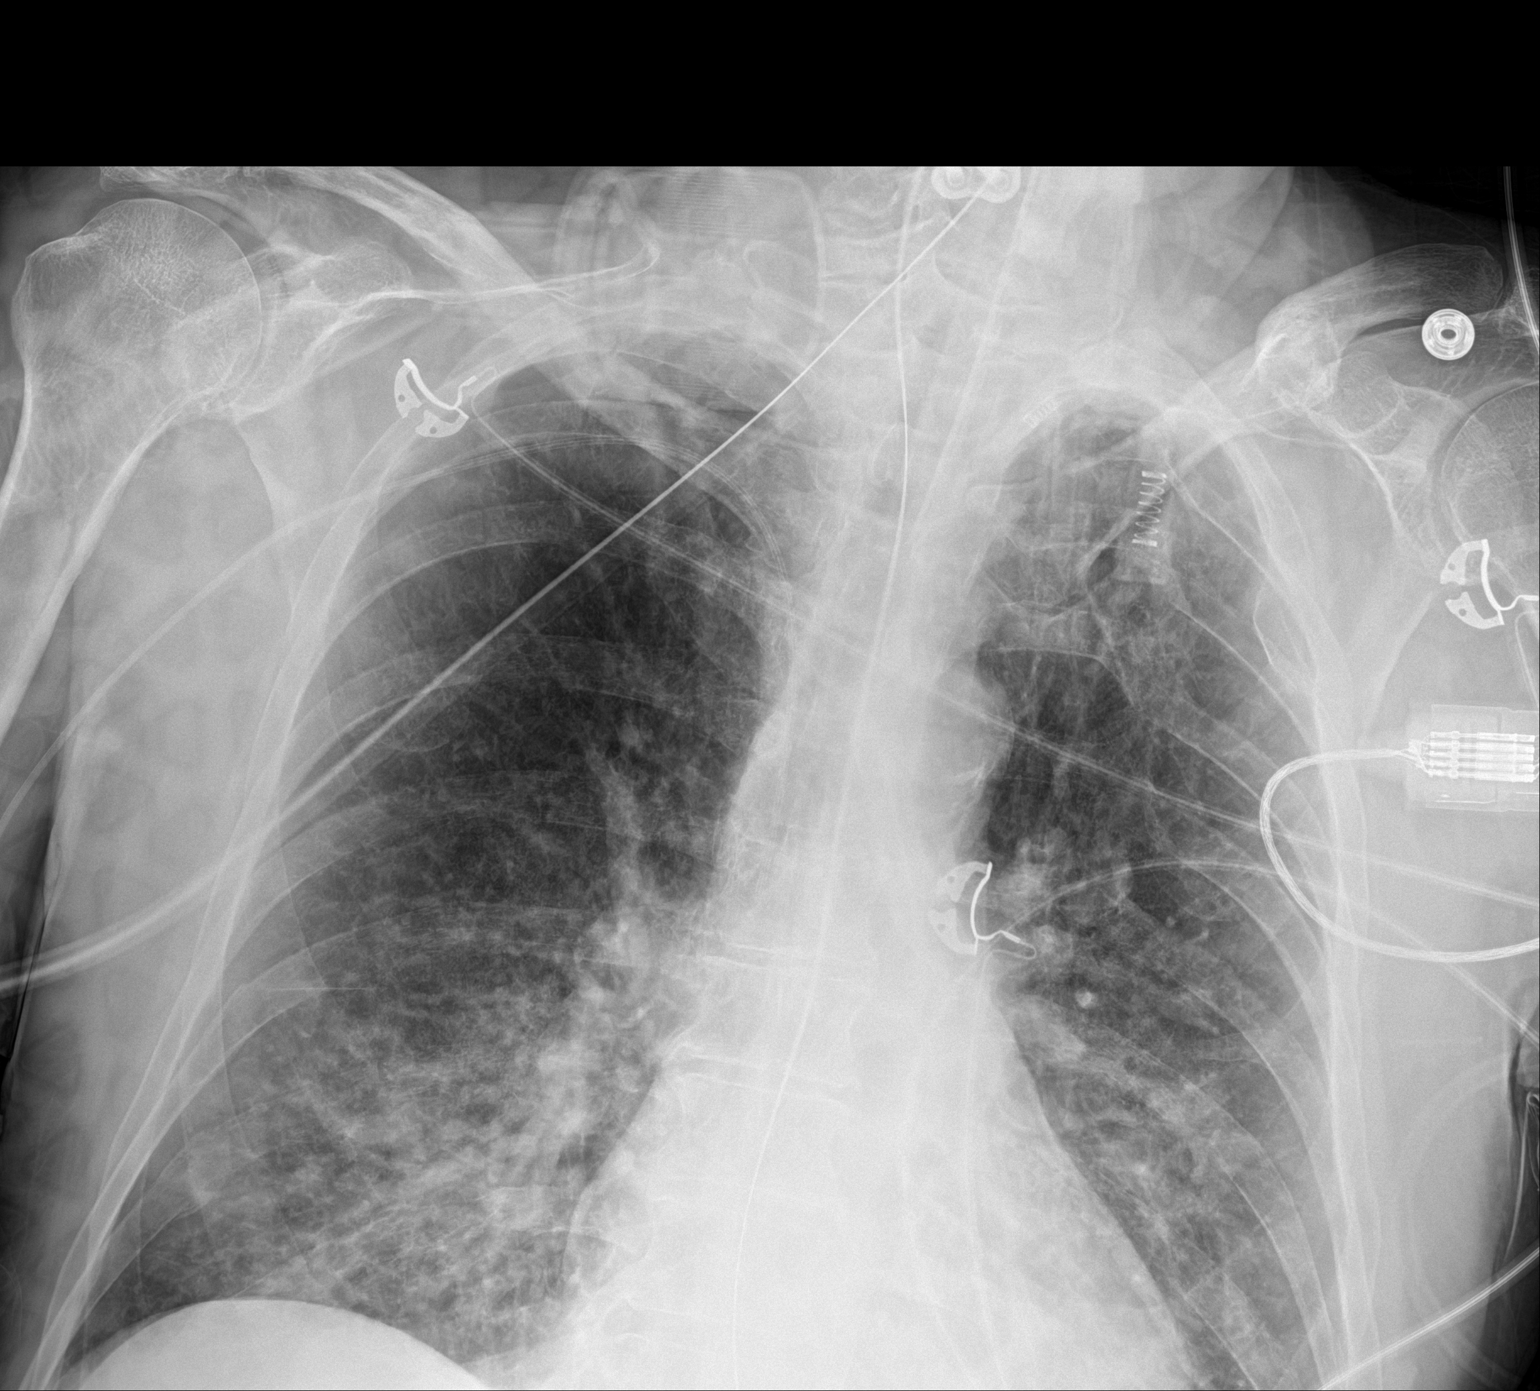

[chest ap (2 of 2)]
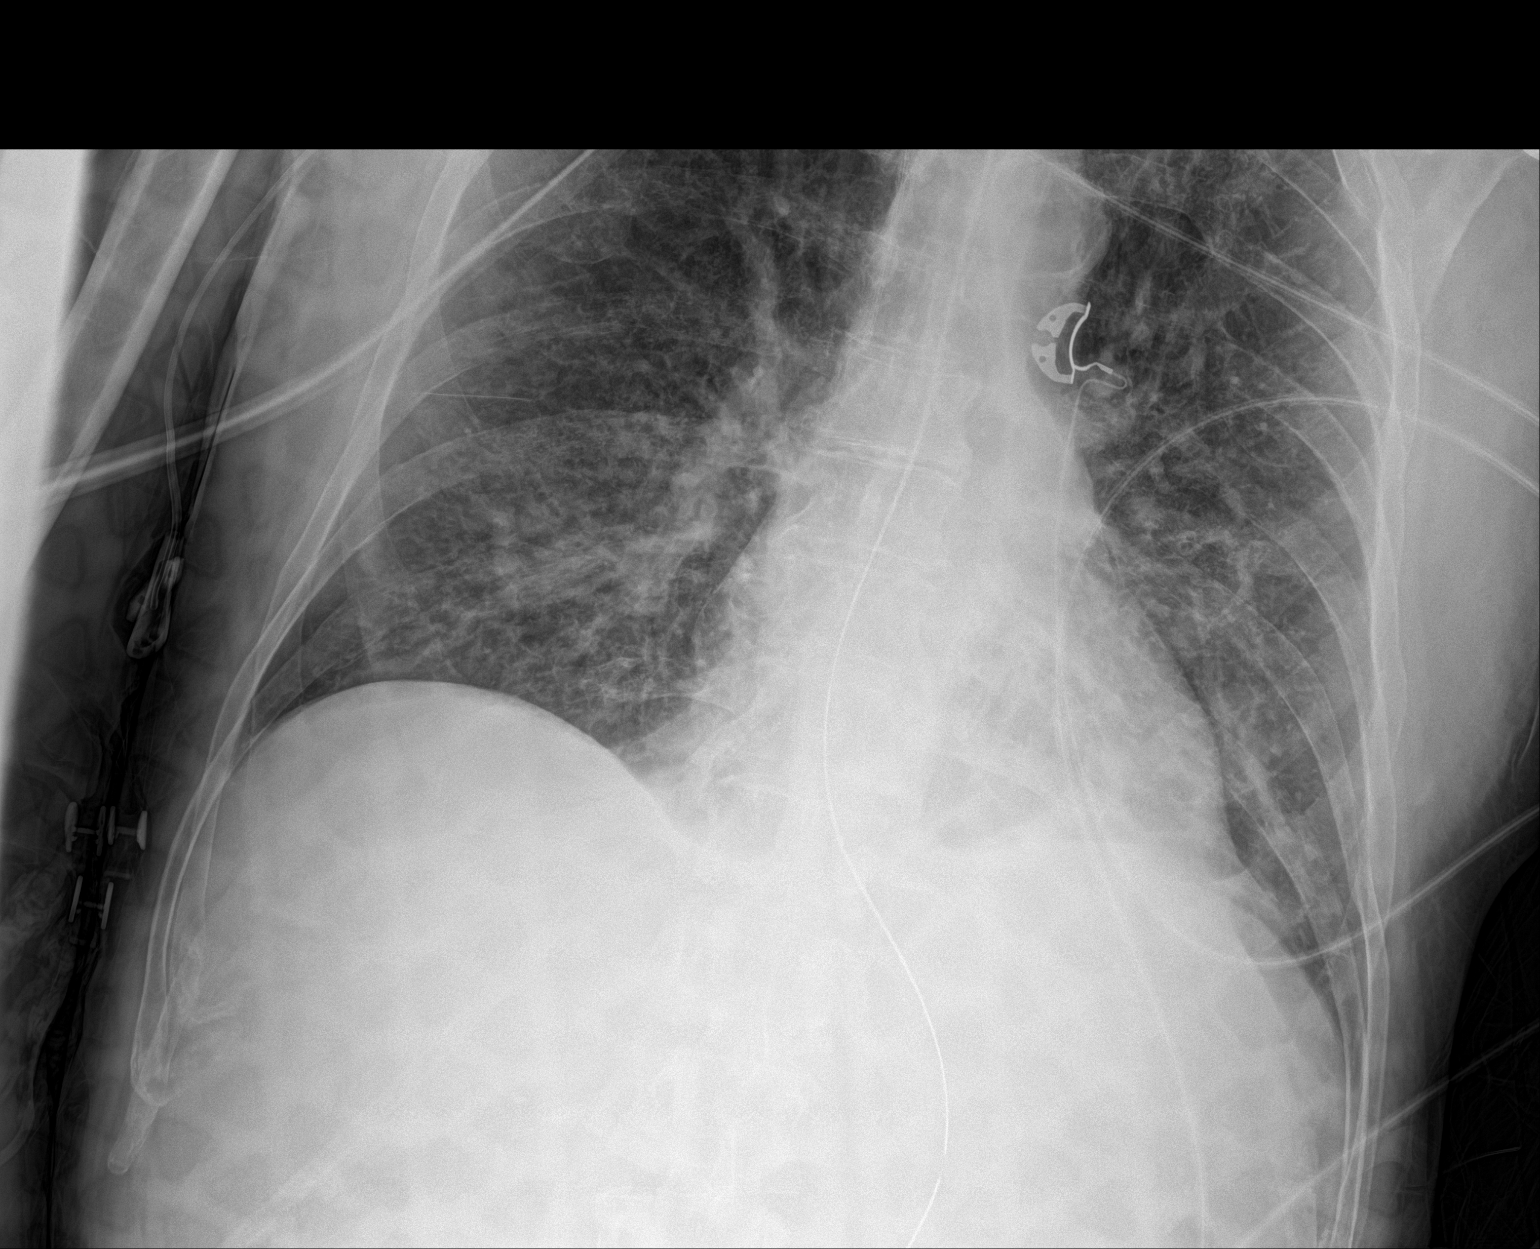

[2 of 2 positions shown; findings below may reference images not displayed]

FINDINGS: A new right arm PICC line is seen with tip overlying the distal SVC.
Endotracheal tube and nasogastric tube remain in place.

Pulmonary hyperinflation is again seen. New mild airspace disease is
seen in both lower lobes. Multiple old left rib and clavicle
fracture deformities are again noted
IMPRESSION: 1. New right arm PICC line in appropriate position.
2. New mild bilateral lower lobe airspace disease.

## 2020-10-19 IMAGING — CT CT HEAD WITHOUT CONTRAST
4 series · 16 of 47 positions shown, 18 images · non-contrast
Comparison: Prior CT from 06/24/2018.

CLINICAL DATA: Follow-up examination for acute stroke.

EXAM:
CT HEAD WITHOUT CONTRAST
TECHNIQUE: Contiguous axial images were obtained from the base of the skull
through the vertex without intravenous contrast.

[Series 3: head without · axial · non-contrast · 0.45mm/px · z∈[-169,-49]mm · 7 of 34 slices shown, 9 images]
[im 5/34  brain]
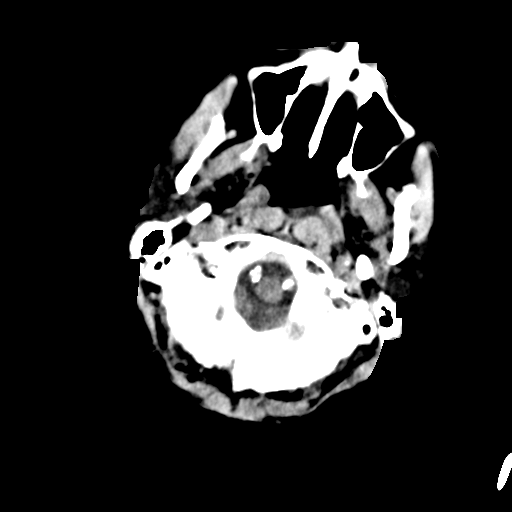
[im 5/34  bone]
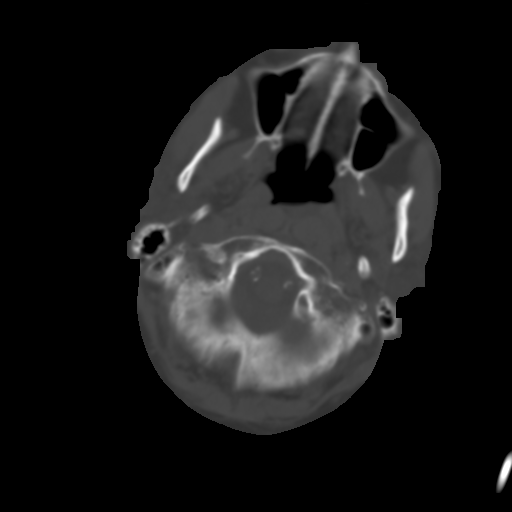
[im 9/34  brain]
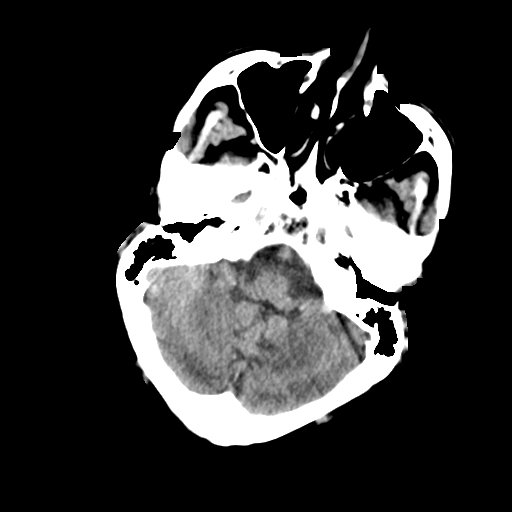
[im 13/34  brain]
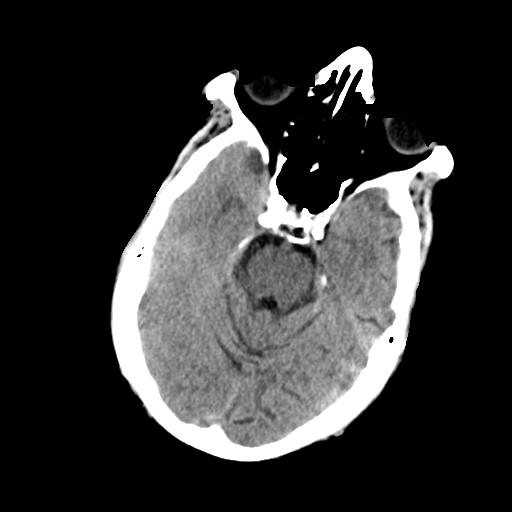
[im 17/34  brain]
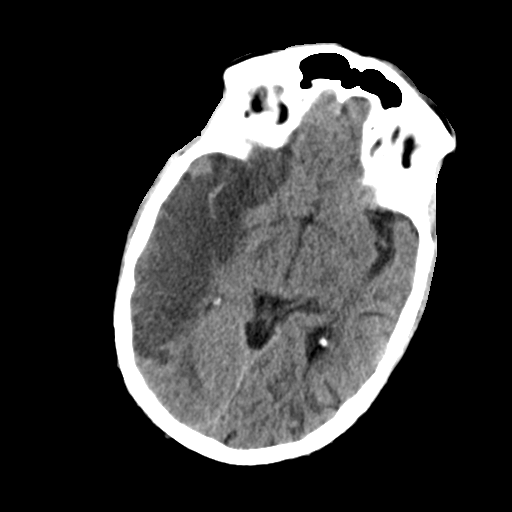
[im 21/34  brain]
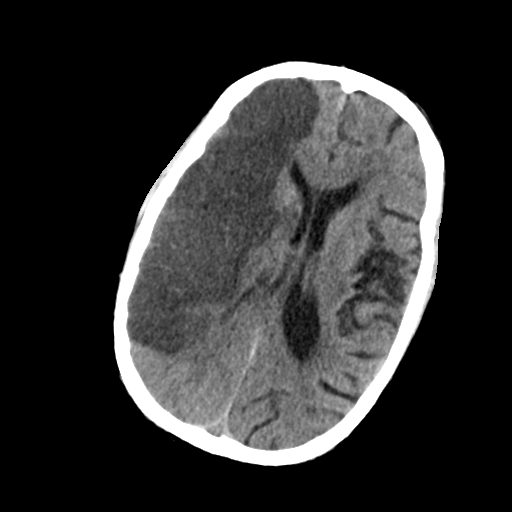
[im 21/34  bone]
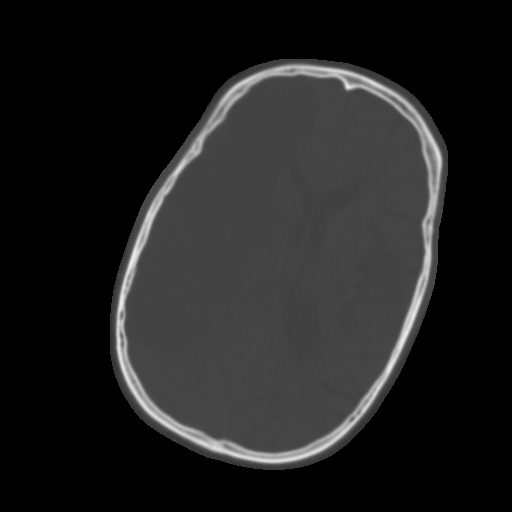
[im 25/34  brain]
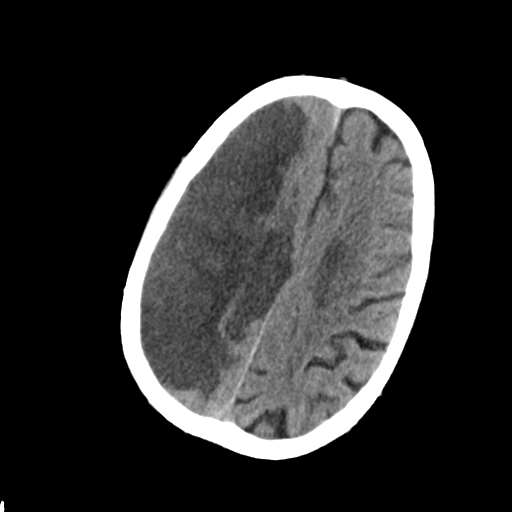
[im 29/34  brain]
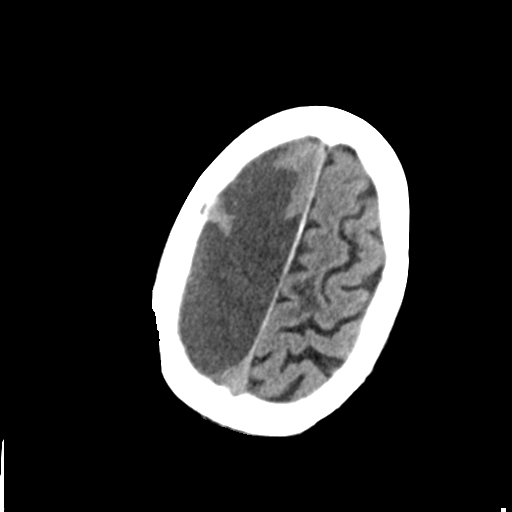

[Series 4: head bone · axial · 0.45mm/px · z∈[-173,-139]mm · 3 of 85 slices shown]
[im 9/85  bone]
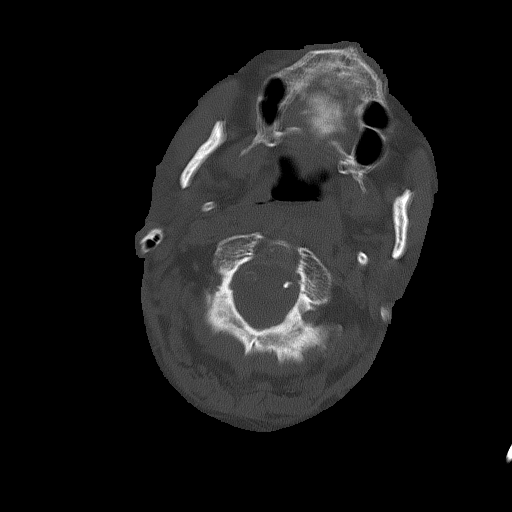
[im 17/85  bone]
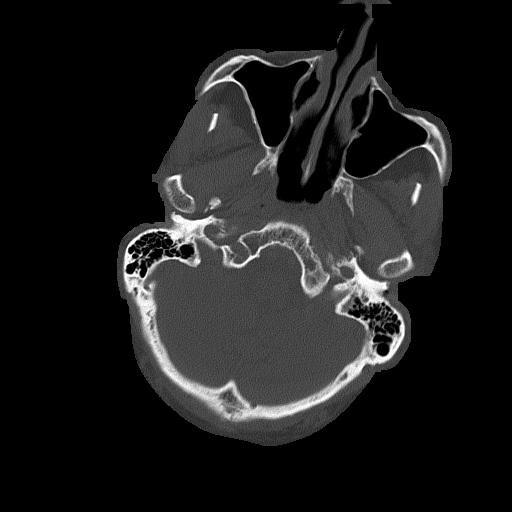
[im 26/85  bone]
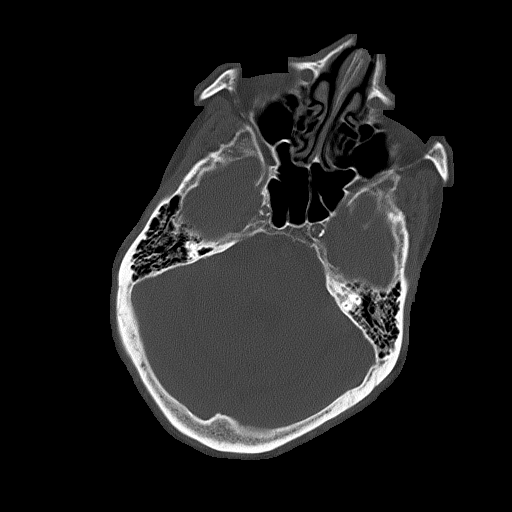

[Series 5: head without cor · coronal · non-contrast · 0.33mm/px · 3 of 72 slices shown]
[im 24/72  brain]
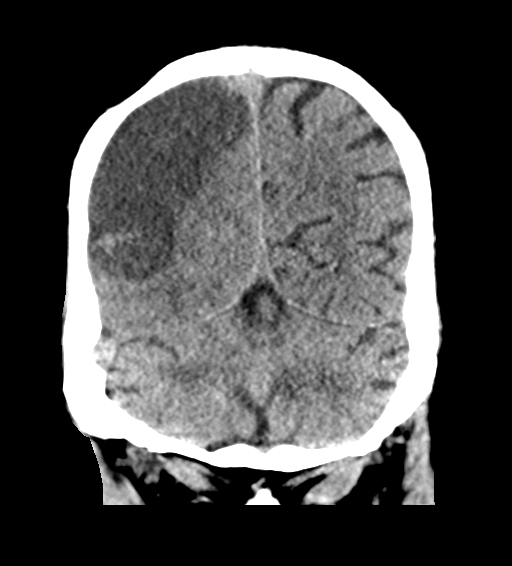
[im 32/72  brain]
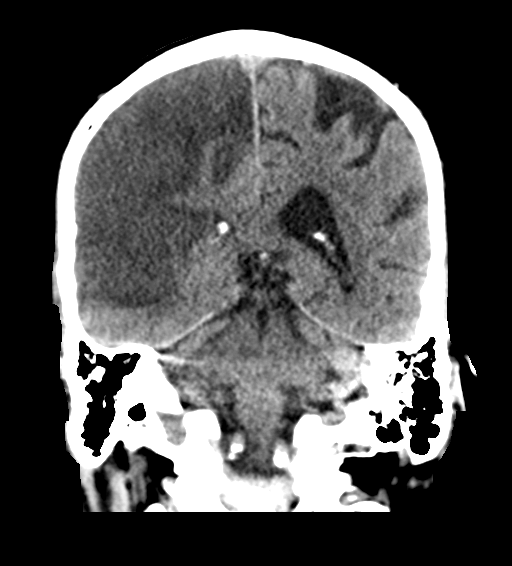
[im 40/72  brain]
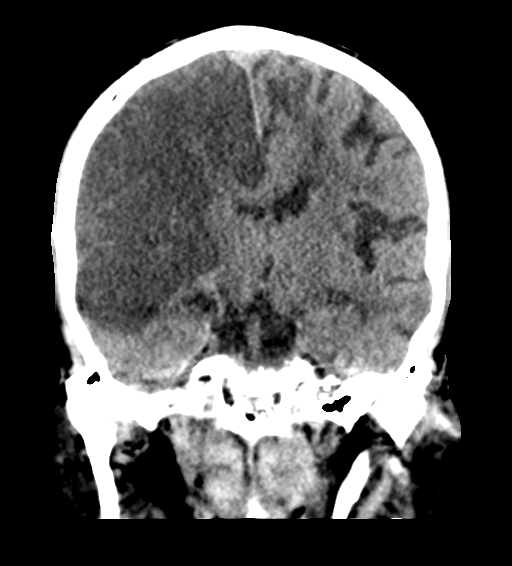

[Series 6: head without sag · sagittal · non-contrast · 0.36mm/px · 3 of 60 slices shown]
[im 20/60  brain]
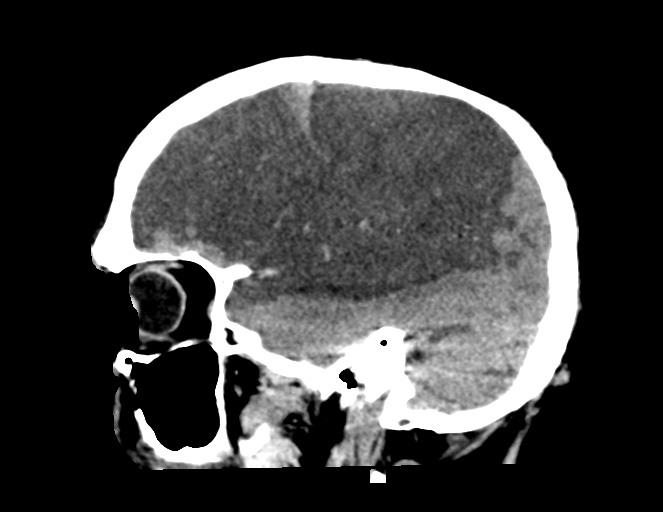
[im 30/60  brain]
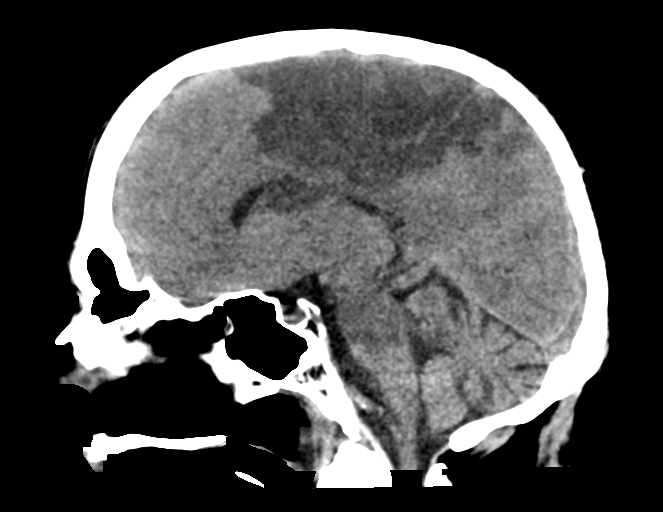
[im 40/60  brain]
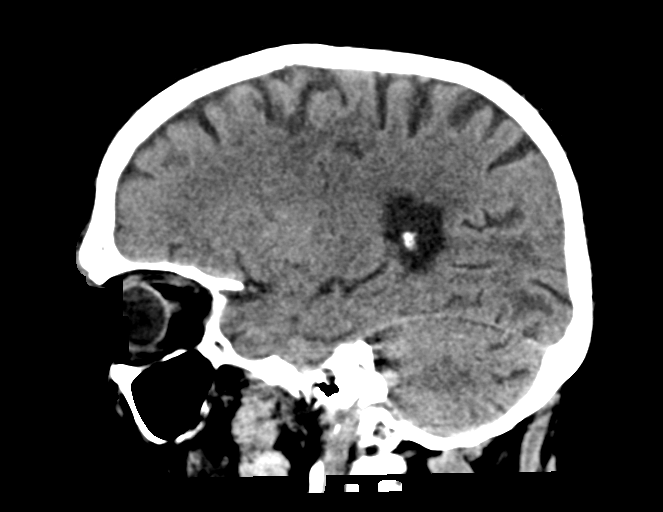

[16 of 47 positions shown; findings below may reference images not displayed]

FINDINGS: Brain: Extensive evolving cytotoxic edema throughout the right MCA
territory again seen, now more well-defined as compared to previous
exam. Overall size and distribution relatively unchanged. Increasing
regional mass effect with partial effacement of the right lateral
ventricle. New right-to-left midline shift of 5-6 mm. No
hydrocephalus or ventricular trapping at this time. Basilar cisterns
remain patent. No evidence for hemorrhagic transformation.

Underlying atrophy with chronic microvascular ischemic disease again
noted. Chronic left cerebral infarcts again noted. No acute
intracranial hemorrhage. No other acute large vessel territory
infarct. No extra-axial fluid collection.

Vascular: Calcified atherosclerosis at the skull base. No new
hyperdense vessel.

Skull: Scalp soft tissues and calvarium within normal limits.

Sinuses/Orbits: Globes orbital soft tissues demonstrate no acute
finding. Paranasal sinuses are clear. No mastoid effusion.

Other: Endotracheal and enteric tubes partially visualized.
IMPRESSION: 1. Continued interval evolution of large acute right MCA territory
infarct, with increased regional mass effect and new right-to-left
midline shift of 5-6 mm. No hydrocephalus or ventricular trapping at
this time. No evidence for hemorrhagic transformation.
2. No other new acute intracranial process.

## 2022-12-16 NOTE — Telephone Encounter (Signed)
error
